# Patient Record
Sex: Female | Born: 1965 | Race: White | Hispanic: No | Smoking: Never smoker
Health system: Southern US, Community
[De-identification: ages and names within clinical notes are randomized; demographics above are authoritative.]

## PROBLEM LIST (undated history)

## (undated) DIAGNOSIS — J449 Chronic obstructive pulmonary disease, unspecified: Secondary | ICD-10-CM

## (undated) DIAGNOSIS — S161XXA Strain of muscle, fascia and tendon at neck level, initial encounter: Secondary | ICD-10-CM

## (undated) DIAGNOSIS — E079 Disorder of thyroid, unspecified: Secondary | ICD-10-CM

## (undated) DIAGNOSIS — F329 Major depressive disorder, single episode, unspecified: Secondary | ICD-10-CM

## (undated) DIAGNOSIS — J988 Other specified respiratory disorders: Secondary | ICD-10-CM

## (undated) DIAGNOSIS — G4730 Sleep apnea, unspecified: Secondary | ICD-10-CM

## (undated) DIAGNOSIS — R579 Shock, unspecified: Secondary | ICD-10-CM

## (undated) DIAGNOSIS — K219 Gastro-esophageal reflux disease without esophagitis: Secondary | ICD-10-CM

## (undated) DIAGNOSIS — C801 Malignant (primary) neoplasm, unspecified: Secondary | ICD-10-CM

## (undated) DIAGNOSIS — IMO0002 Reserved for concepts with insufficient information to code with codable children: Secondary | ICD-10-CM

## (undated) DIAGNOSIS — Q780 Osteogenesis imperfecta: Secondary | ICD-10-CM

## (undated) DIAGNOSIS — M5090 Cervical disc disorder, unspecified, unspecified cervical region: Secondary | ICD-10-CM

## (undated) DIAGNOSIS — G8918 Other acute postprocedural pain: Secondary | ICD-10-CM

## (undated) DIAGNOSIS — R55 Syncope and collapse: Secondary | ICD-10-CM

## (undated) DIAGNOSIS — I10 Essential (primary) hypertension: Secondary | ICD-10-CM

## (undated) DIAGNOSIS — G459 Transient cerebral ischemic attack, unspecified: Secondary | ICD-10-CM

## (undated) DIAGNOSIS — N189 Chronic kidney disease, unspecified: Secondary | ICD-10-CM

## (undated) DIAGNOSIS — R188 Other ascites: Secondary | ICD-10-CM

## (undated) DIAGNOSIS — F419 Anxiety disorder, unspecified: Secondary | ICD-10-CM

## (undated) DIAGNOSIS — E119 Type 2 diabetes mellitus without complications: Secondary | ICD-10-CM

## (undated) DIAGNOSIS — M549 Dorsalgia, unspecified: Secondary | ICD-10-CM

## (undated) DIAGNOSIS — G43909 Migraine, unspecified, not intractable, without status migrainosus: Secondary | ICD-10-CM

## (undated) DIAGNOSIS — M359 Systemic involvement of connective tissue, unspecified: Secondary | ICD-10-CM

## (undated) DIAGNOSIS — K7581 Nonalcoholic steatohepatitis (NASH): Secondary | ICD-10-CM

## (undated) DIAGNOSIS — K9189 Other postprocedural complications and disorders of digestive system: Secondary | ICD-10-CM

## (undated) DIAGNOSIS — E039 Hypothyroidism, unspecified: Secondary | ICD-10-CM

## (undated) DIAGNOSIS — R1012 Left upper quadrant pain: Secondary | ICD-10-CM

## (undated) DIAGNOSIS — K838 Other specified diseases of biliary tract: Secondary | ICD-10-CM

## (undated) HISTORY — DX: Left upper quadrant pain: R10.12

## (undated) HISTORY — DX: Reserved for concepts with insufficient information to code with codable children: IMO0002

## (undated) HISTORY — DX: Other acute postprocedural pain: G89.18

## (undated) HISTORY — DX: Other postprocedural complications and disorders of digestive system: K91.89

## (undated) HISTORY — PX: WISDOM TOOTH EXTRACTION: SHX21

## (undated) HISTORY — DX: Shock, unspecified: R57.9

## (undated) HISTORY — DX: Syncope and collapse: R55

## (undated) HISTORY — DX: Strain of muscle, fascia and tendon at neck level, initial encounter: S16.1XXA

## (undated) HISTORY — DX: Gastro-esophageal reflux disease without esophagitis: K21.9

## (undated) HISTORY — DX: Other specified respiratory disorders: J98.8

## (undated) HISTORY — DX: Migraine, unspecified, not intractable, without status migrainosus: G43.909

## (undated) HISTORY — DX: Major depressive disorder, single episode, unspecified: F32.9

## (undated) HISTORY — PX: TUBAL LIGATION: SHX77

## (undated) HISTORY — PX: ABDOMINAL HYSTERECTOMY: SHX81

## (undated) HISTORY — DX: Other specified diseases of biliary tract: K83.8

## (undated) SURGERY — Surgical Case
Anesthesia: *Unknown

---

## 2003-10-31 ENCOUNTER — Emergency Department: Payer: Self-pay | Admitting: Emergency Medicine

## 2003-10-31 ENCOUNTER — Other Ambulatory Visit: Payer: Self-pay

## 2006-11-08 ENCOUNTER — Other Ambulatory Visit: Payer: Self-pay

## 2006-11-08 ENCOUNTER — Emergency Department: Payer: Self-pay | Admitting: Emergency Medicine

## 2007-02-15 ENCOUNTER — Ambulatory Visit: Payer: Self-pay | Admitting: General Practice

## 2007-02-22 ENCOUNTER — Ambulatory Visit: Payer: Self-pay | Admitting: General Practice

## 2007-04-13 ENCOUNTER — Emergency Department: Payer: Self-pay | Admitting: Emergency Medicine

## 2007-05-04 ENCOUNTER — Emergency Department: Payer: Self-pay | Admitting: Emergency Medicine

## 2007-05-13 ENCOUNTER — Emergency Department: Payer: Self-pay | Admitting: Emergency Medicine

## 2007-07-12 ENCOUNTER — Ambulatory Visit: Payer: Self-pay | Admitting: Family Medicine

## 2007-08-05 ENCOUNTER — Emergency Department: Payer: Self-pay | Admitting: Emergency Medicine

## 2007-08-10 ENCOUNTER — Emergency Department: Payer: Self-pay | Admitting: Emergency Medicine

## 2007-10-12 ENCOUNTER — Ambulatory Visit: Payer: Self-pay
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
## 2008-05-09 ENCOUNTER — Emergency Department: Payer: Self-pay | Admitting: Emergency Medicine

## 2008-07-07 IMAGING — CR RIGHT ELBOW - COMPLETE 3+ VIEW
1 series · 5 of 5 positions shown · non-contrast
Comparison: none

REASON FOR EXAM: Right Elbow Pain-FAX RESULTS DR.JUMPER [phone]
COMMENTS:

PROCEDURE:     DXR - DXR ELBOW RT COMP W/OBLIQUES  - February 22, 2007 [DATE]
RESULT:     No fracture, dislocation or other acute bony abnormality is
identified.

[Series 1: view not recorded · 0.17mm/px · 5 of 5 slices shown]
[im 1/5]
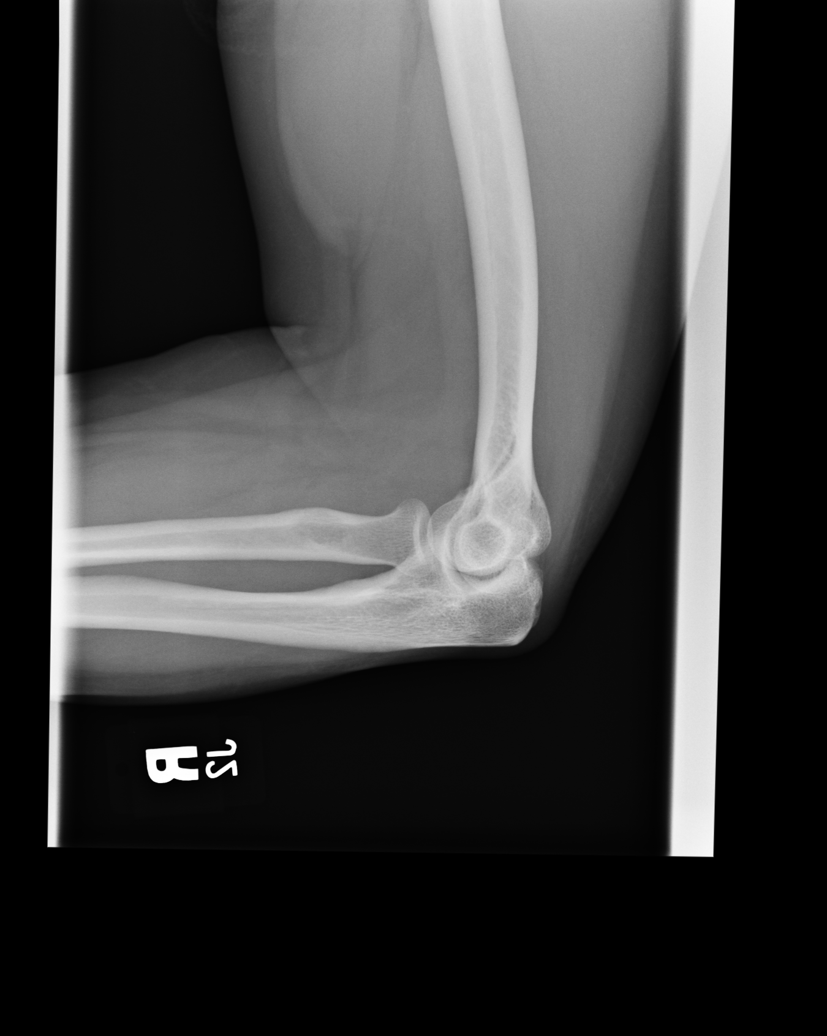
[im 2/5]
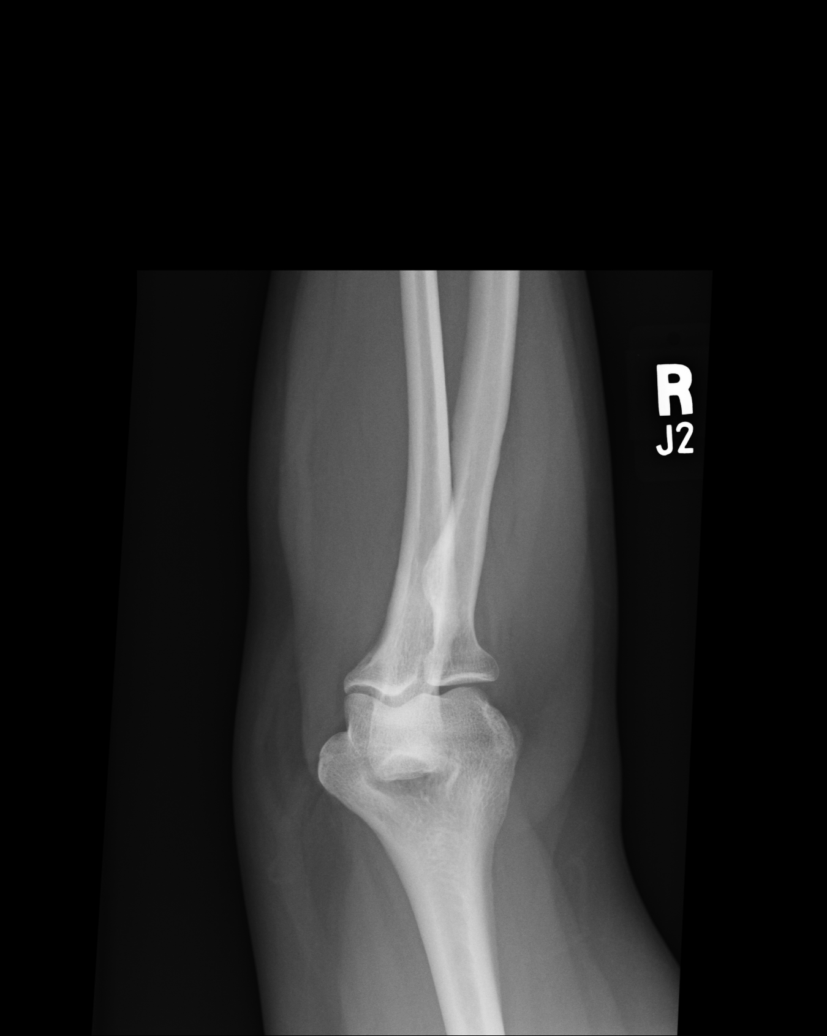
[im 3/5]
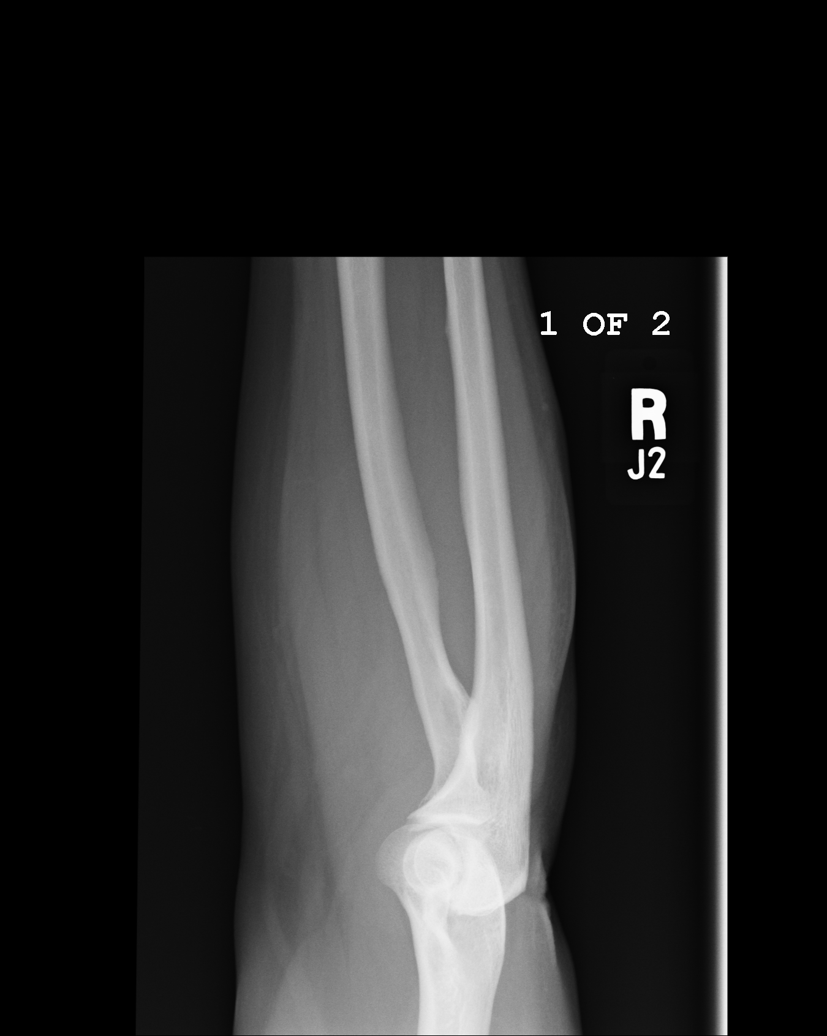
[im 4/5]
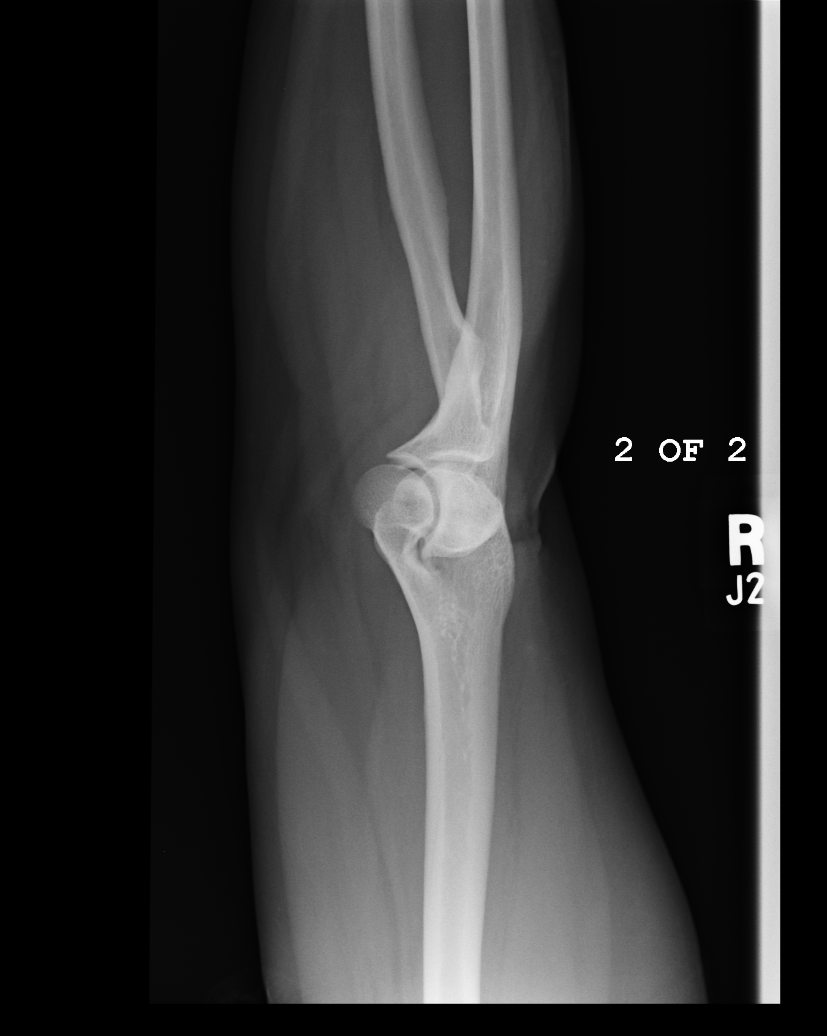
[im 5/5]
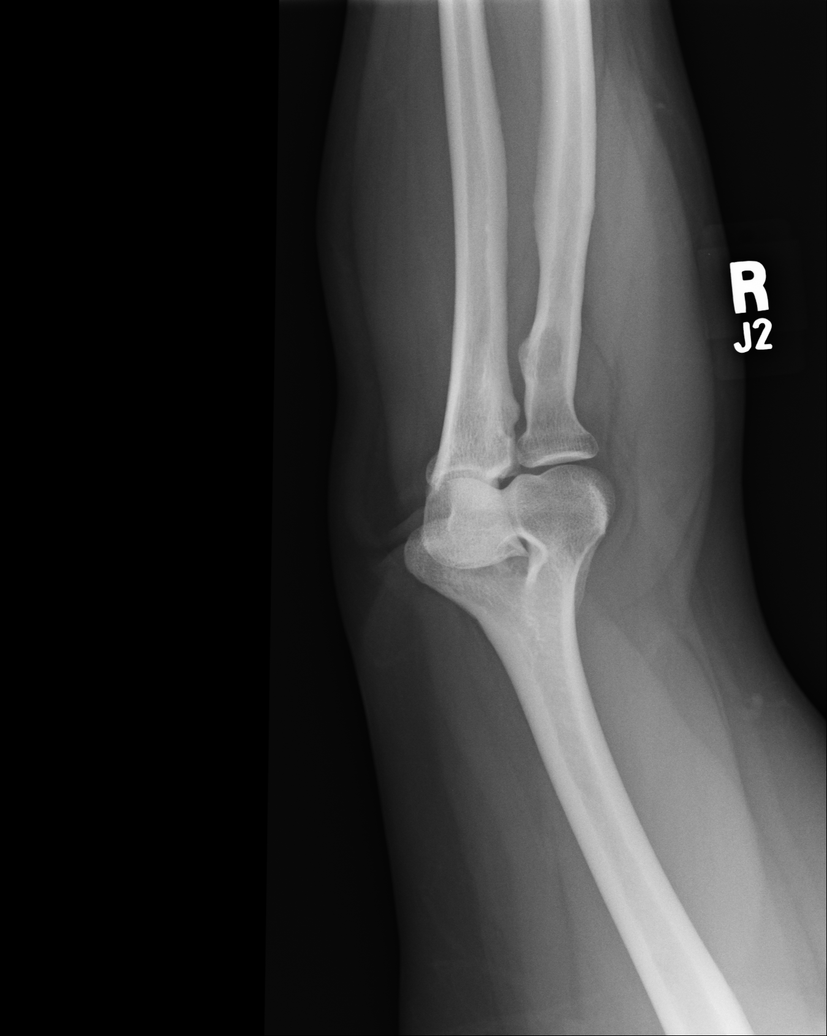

[5 of 5 positions shown; findings below may reference images not displayed]

IMPRESSION: 1. No significant osseous abnormality noted.

## 2008-11-24 IMAGING — CR DG LUMBAR SPINE 2-3V
1 series · 3 of 3 positions shown · non-contrast
Comparison: none

REASON FOR EXAM: Back Pain
COMMENTS:

[Series 1: view not recorded · 0.17mm/px · 3 of 3 slices shown]
[im 1/3]
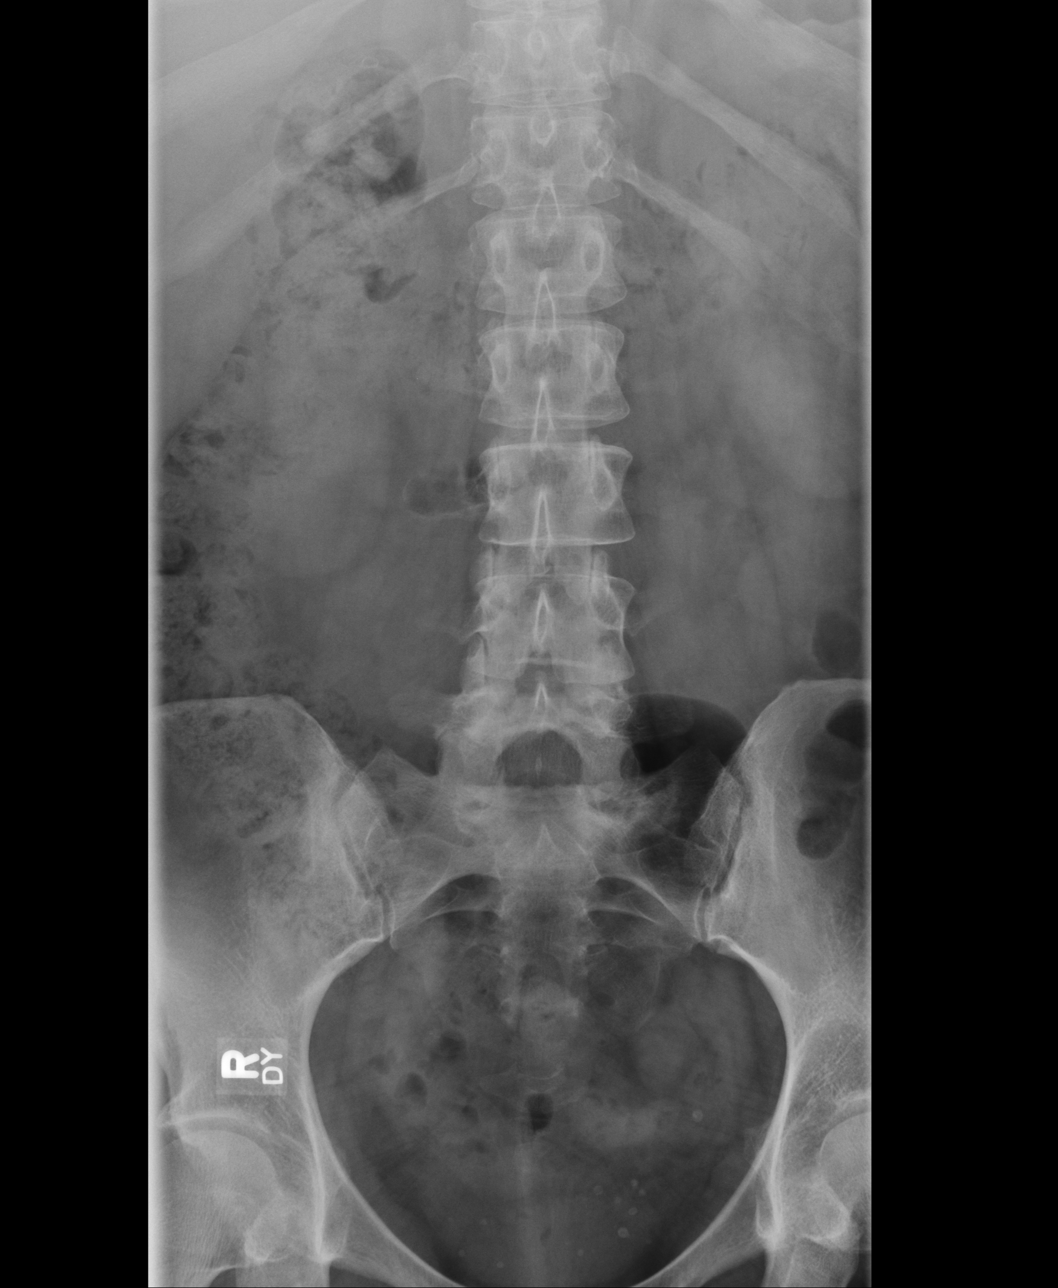
[im 2/3]
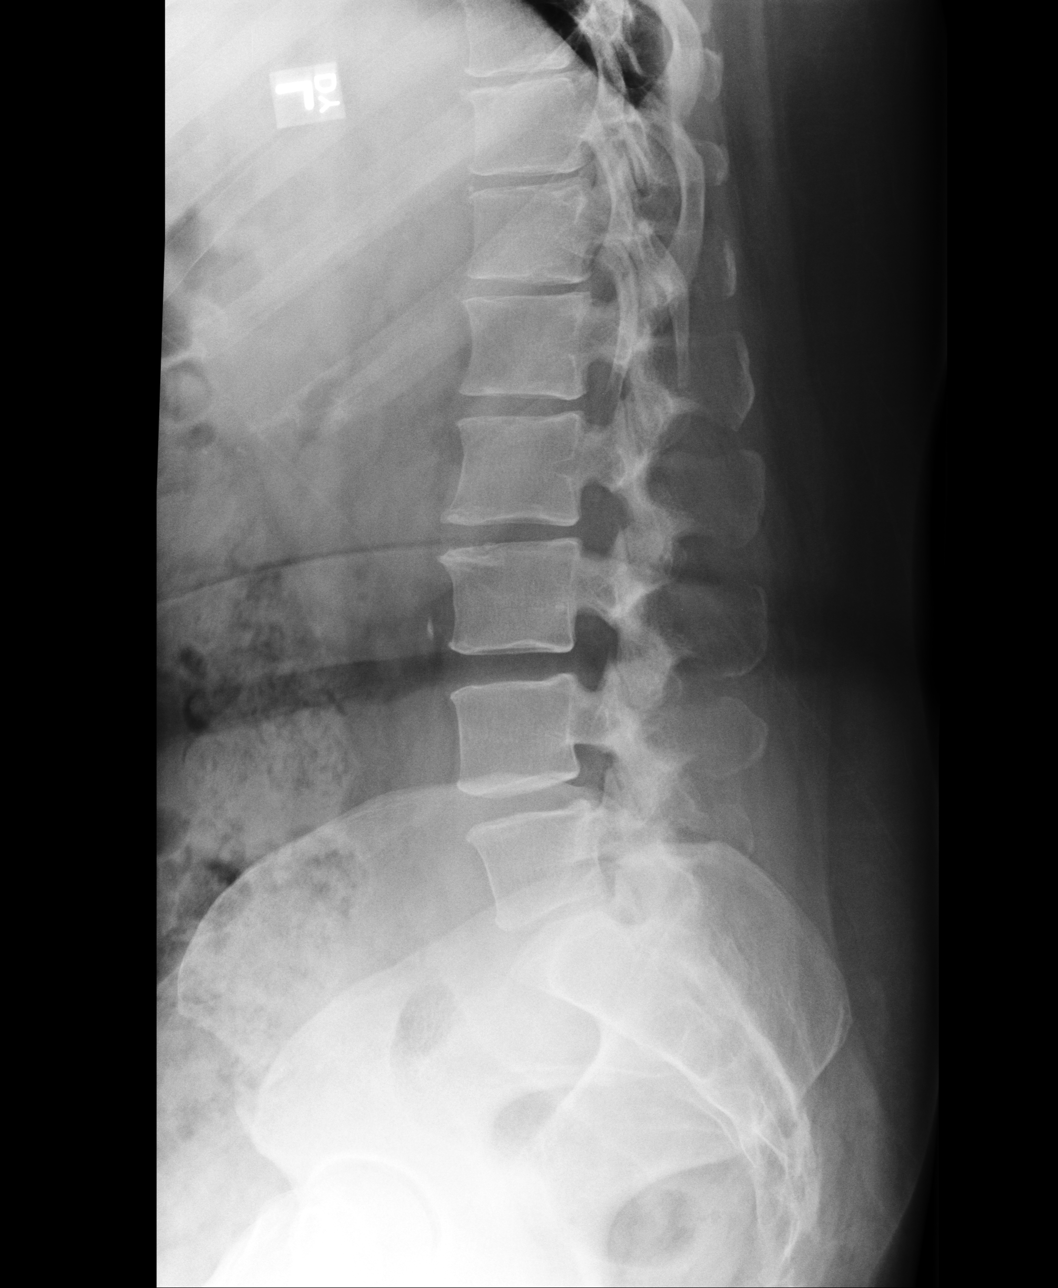
[im 3/3]
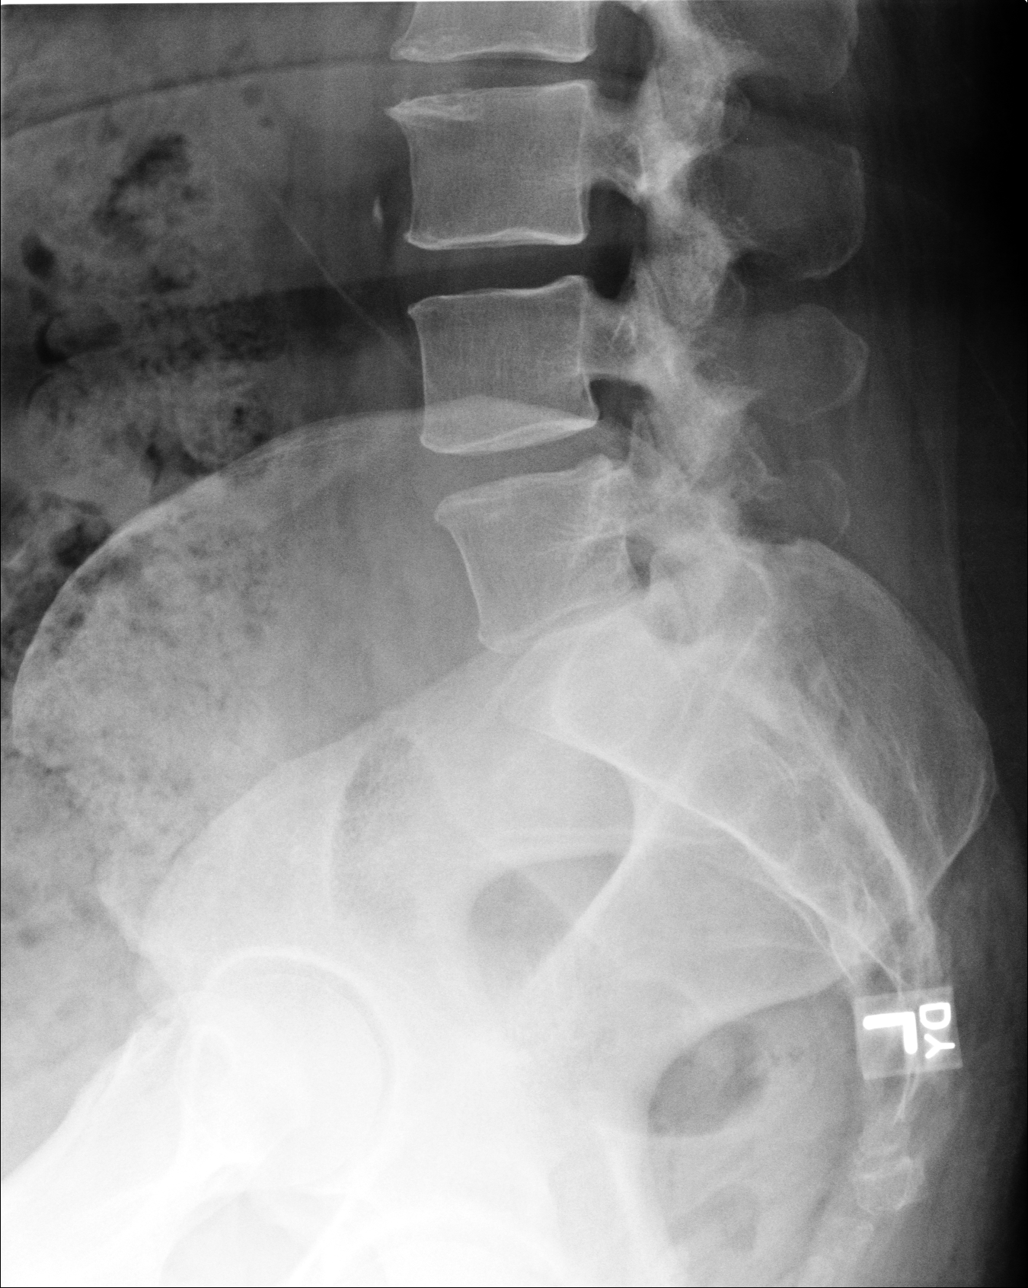

[3 of 3 positions shown; findings below may reference images not displayed]

PROCEDURE:     DXR - DXR LUMBAR SPINE AP AND LATERAL  - July 12, 2007  [DATE]

RESULT:     The vertebral body heights are well maintained. No fracture is
seen. No lytic or blastic lesions are noted. The pedicles are bilaterally
intact. There is a very slightly narrowed appearance to the intervertebral
disc space at L4-L5. The finding is quite minimal but could represent early
manifestation of disc disease. This could be further evaluated by MR if such
is clinically indicated. There is also observed narrowing of the L5-S1
intervertebral disc space which is likely developmental although disc
disease cannot be entirely excluded on plain film examination. This, too,
could be further evaluated at MR, if clinically indicated. There is slight
anterior hypertrophic spurring at L2 and L3.
IMPRESSION: 1. No fracture or other acute bony abnormality is seen.
2. Possible narrowing of the disc spaces at L4-L5 and L5-S1. This could be
further evaluated by MR if clinically indicated.

Thank you for the opportunity to contribute to the care of your patient.

ORDERING AND ATTENDING PHYSICIAN SHOULDER READ:
Klever Jumper, M.D.
DDS Medical Consultant

## 2008-12-18 IMAGING — US US EXTREM LOW VENOUS BILAT
1 series · 17 of 24 positions shown · non-contrast
Comparison: none

REASON FOR EXAM: leg swelling with Tabak factors for DVT
COMMENTS:   LMP: Post Hysterectomy

PROCEDURE:     US  - US DOPPLER LOW EXTR BILATERAL  - August 05, 2007  [DATE]
RESULT:     Comparison: None
INDICATION: Swelling

[Series 1: us extrem low venous bilat · 17 of 38 slices shown]
[im 1/38]
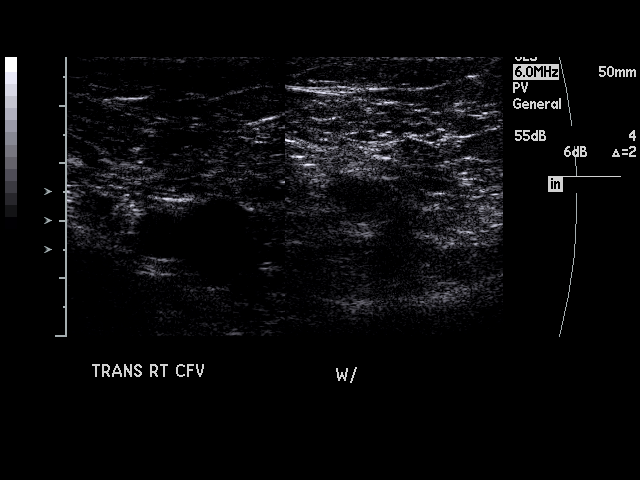
[im 4/38]
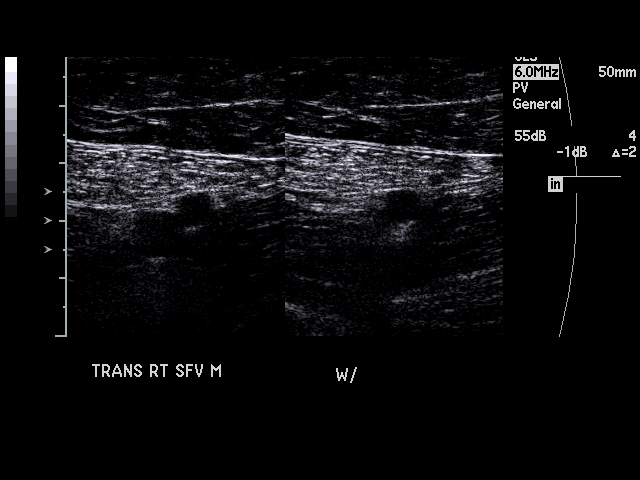
[im 5/38]
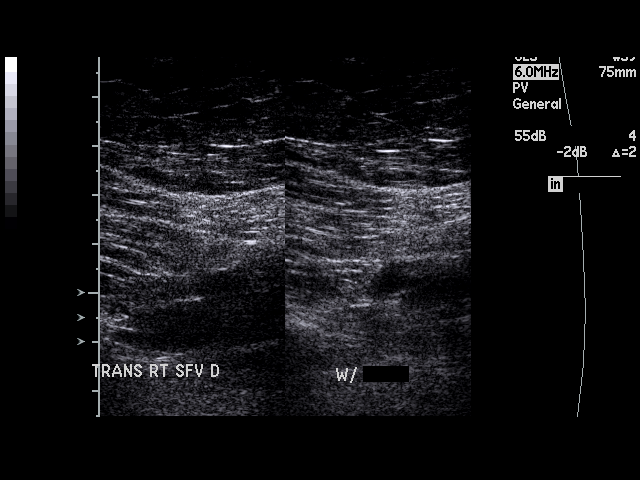
[im 7/38]
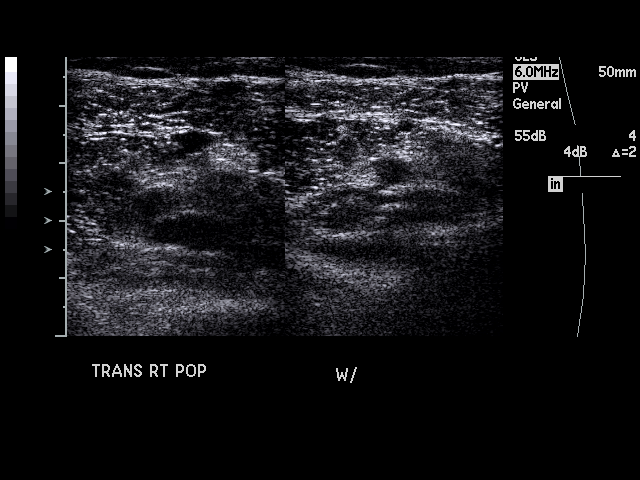
[im 10/38]
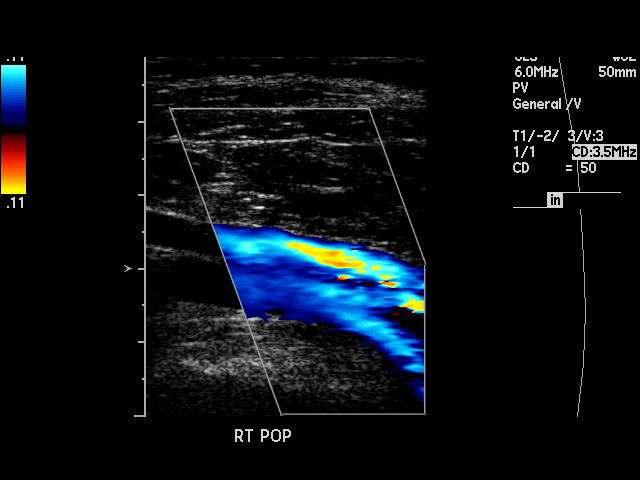
[im 12/38]
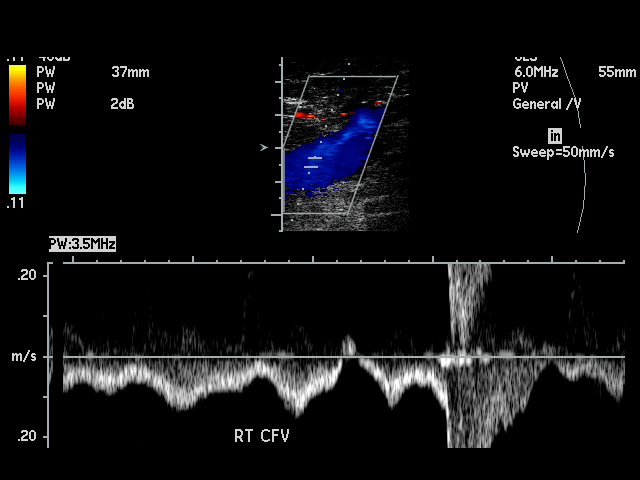
[im 15/38]
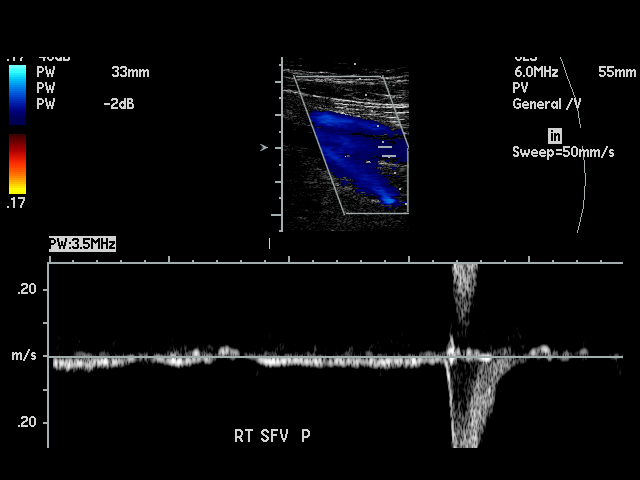
[im 17/38]
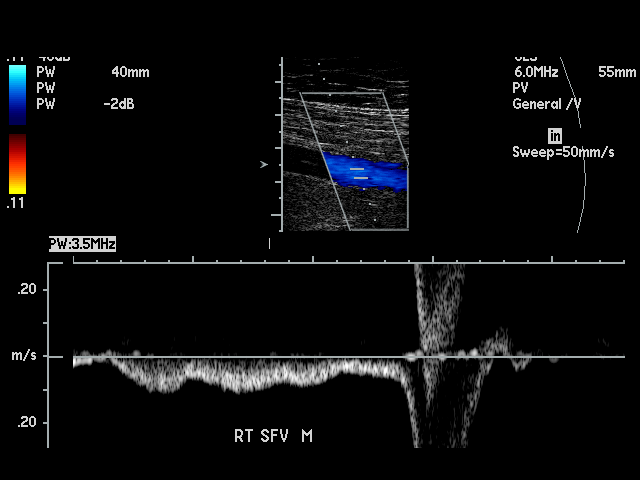
[im 20/38]
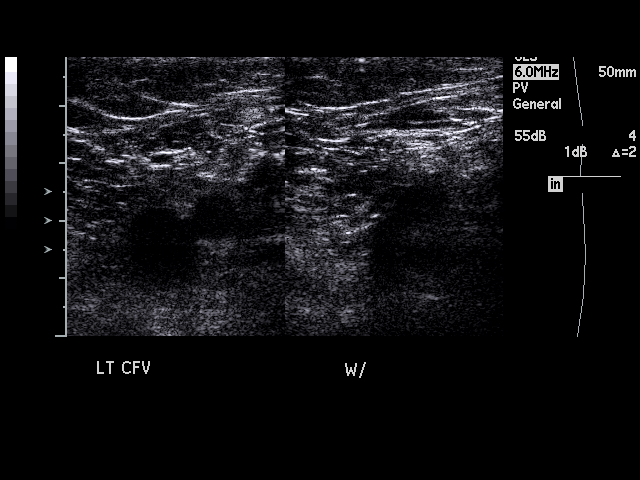
[im 21/38]
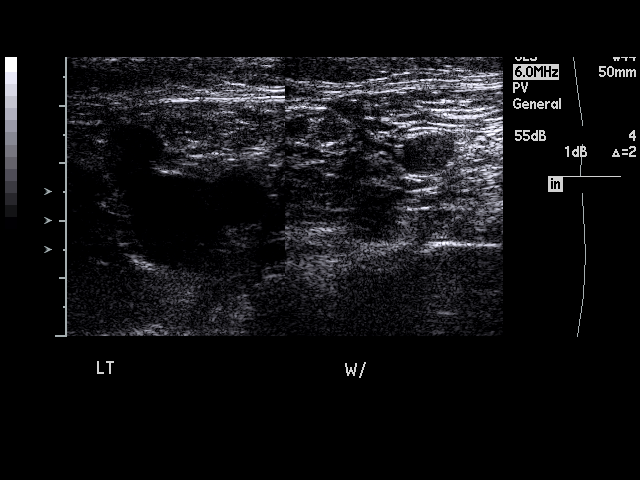
[im 23/38]
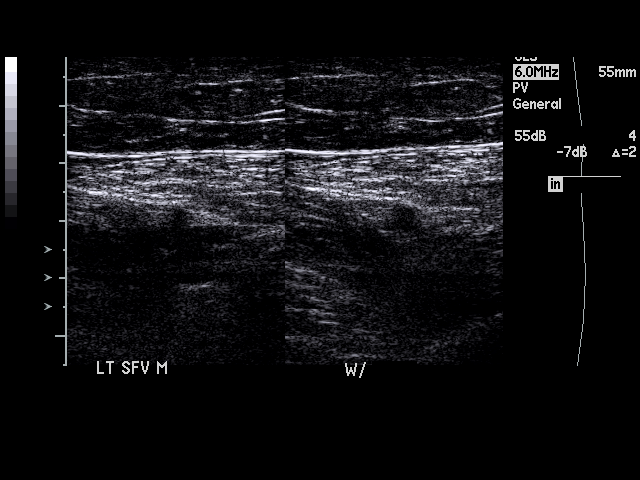
[im 26/38]
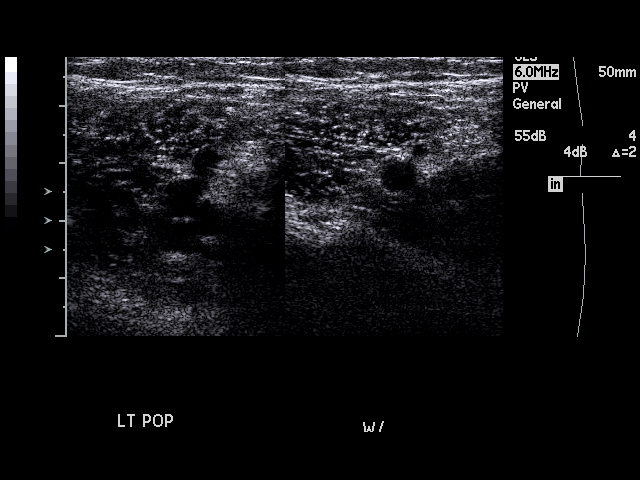
[im 28/38]
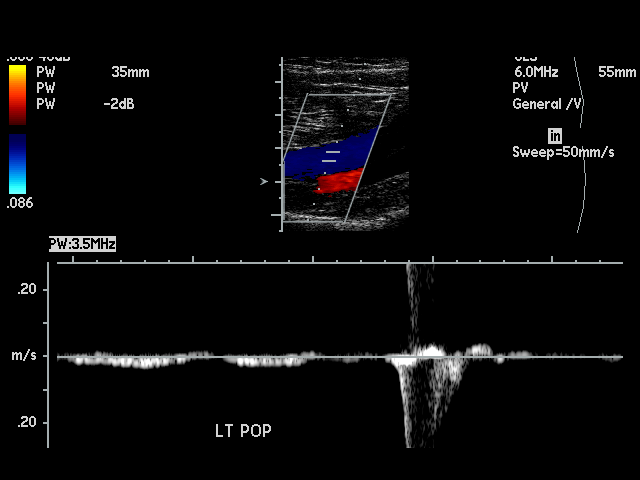
[im 31/38]
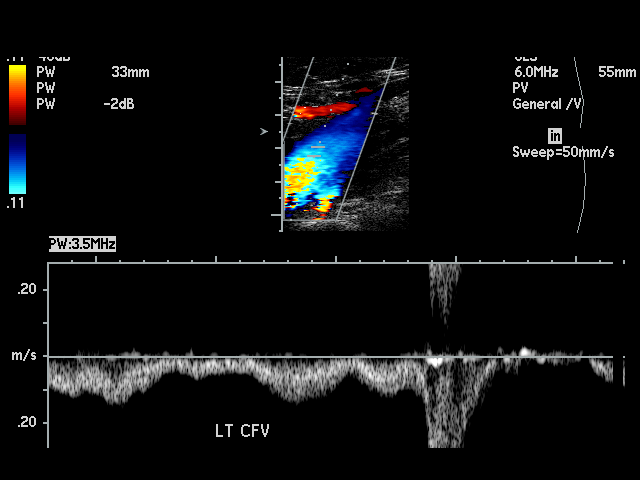
[im 33/38]
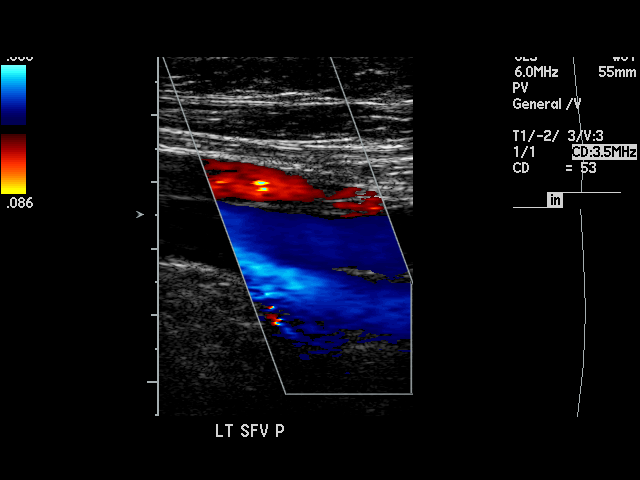
[im 34/38]
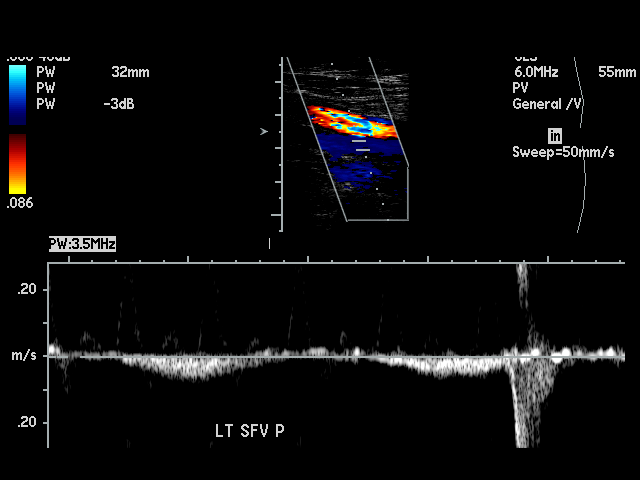
[im 38/38]
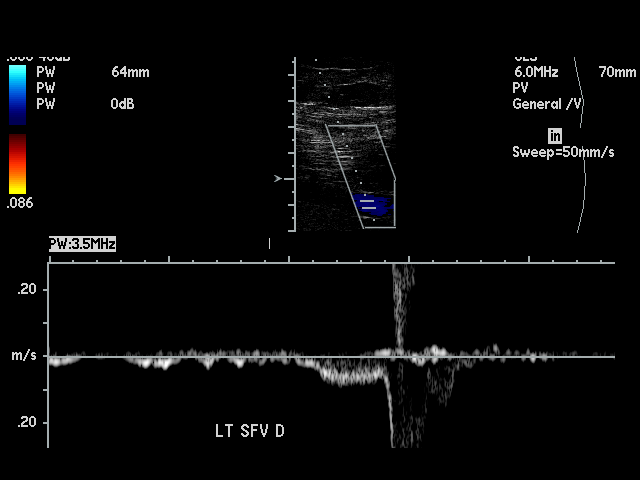

[17 of 24 positions shown; findings below may reference images not displayed]

FINDINGS: Multiple longitudinal and transverse gray-scale as well as color
and spectral Doppler images of the bilateral lower extremity veins were
obtained from the common femoral veins through the popliteal veins.

The right common femoral, greater saphenous, femoral, popliteal veins, and
venous trifurcation are patent, demonstrating normal color-flow and
compressibility. No intraluminal thrombus is identified.There is normal
respiratory variation and augmentation demonstrated at the popliteal vein
levels.

The left common femoral, greater saphenous, femoral, popliteal veins, and
venous trifurcation are patent, demonstrating normal color-flow and
compressibility. No intraluminal thrombus is identified.There is normal
respiratory variation and augmentation demonstrated at the popliteal vein
levels.
IMPRESSION: No evidence of DVT in the bilateral lower extremity.

## 2009-08-24 ENCOUNTER — Inpatient Hospital Stay: Payer: Self-pay | Admitting: Specialist

## 2009-08-27 ENCOUNTER — Emergency Department: Payer: Self-pay | Admitting: Emergency Medicine

## 2009-09-04 ENCOUNTER — Emergency Department: Payer: Self-pay | Admitting: Emergency Medicine

## 2009-09-23 IMAGING — CT CT ABD-PELV W/O CM
1 of 2 series · 15 of 32 positions shown, 19 images · non-contrast
Comparison: none

REASON FOR EXAM: (1) R flank and RLQ pain and hematuria; (2) R flank and
RLQ pain, Stone protocol
COMMENTS:

PROCEDURE:     CT  - CT ABDOMEN AND PELVIS W[DATE]  [DATE]
RESULT:     Emergent noncontrast CT of the abdomen and pelvis is performed
with reconstructions created at 3 mm slice thickness utilizing a renal stone
protocol.

[Series 2: stone · axial · 0.92mm/px · z∈[-700,-268]mm · 15 of 156 slices shown, 19 images]
[im 6/156  soft-tissue]
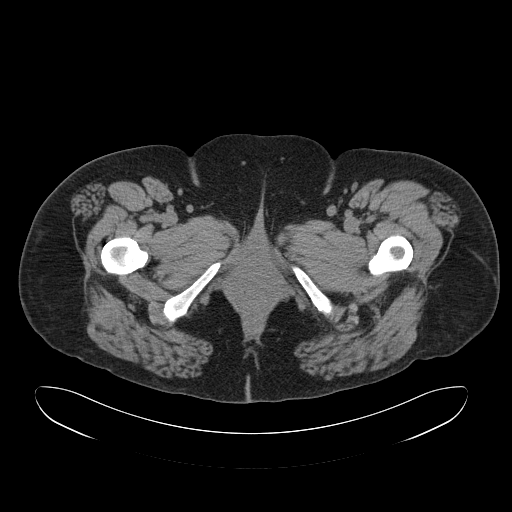
[im 6/156  bone]
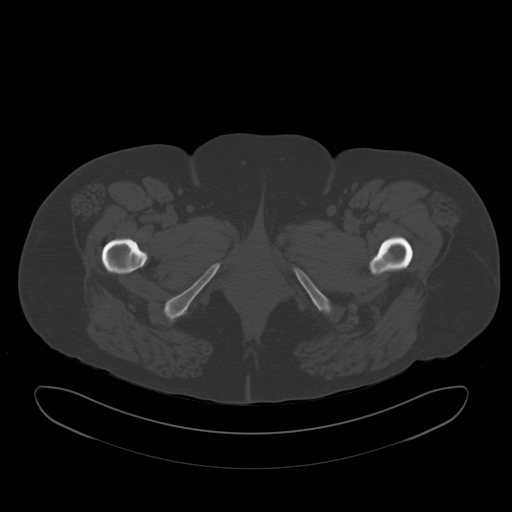
[im 18/156  soft-tissue]
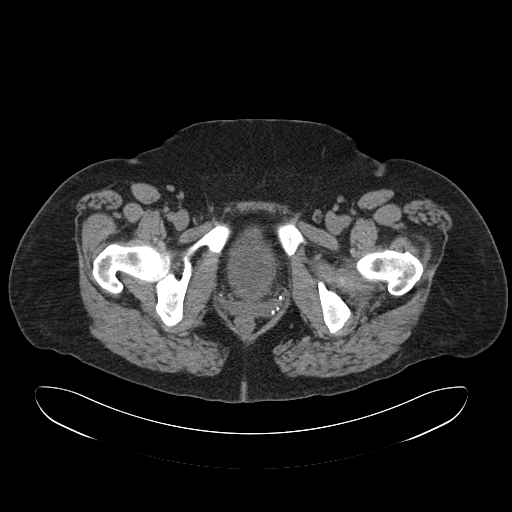
[im 30/156  soft-tissue]
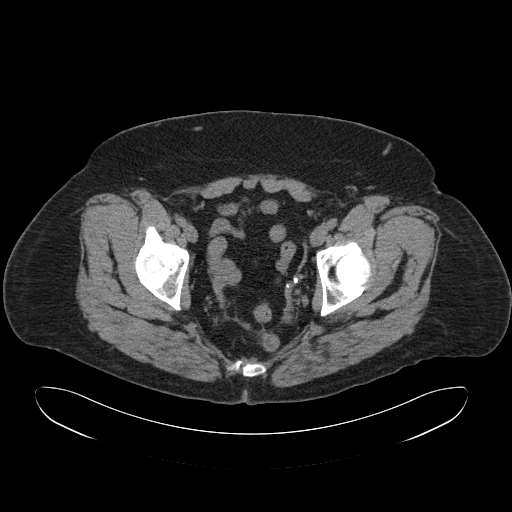
[im 42/156  soft-tissue]
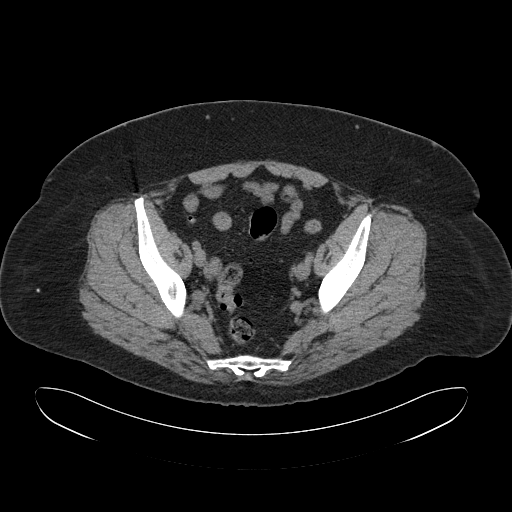
[im 54/156  soft-tissue]
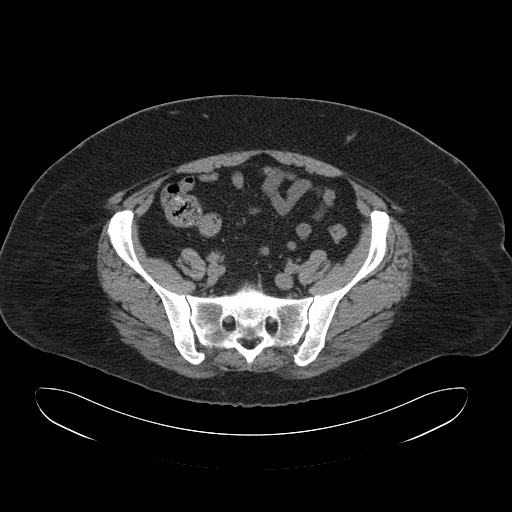
[im 66/156  soft-tissue]
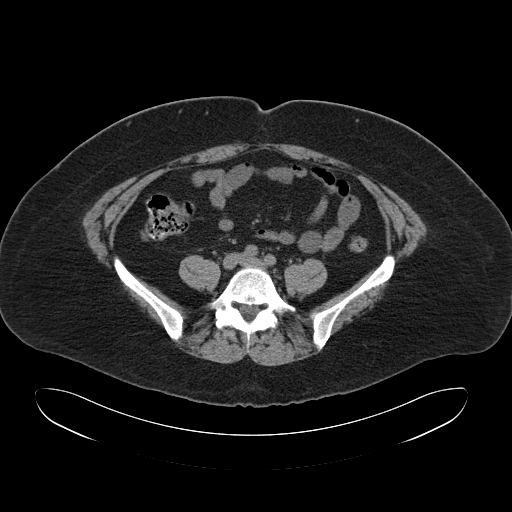
[im 78/156  soft-tissue]
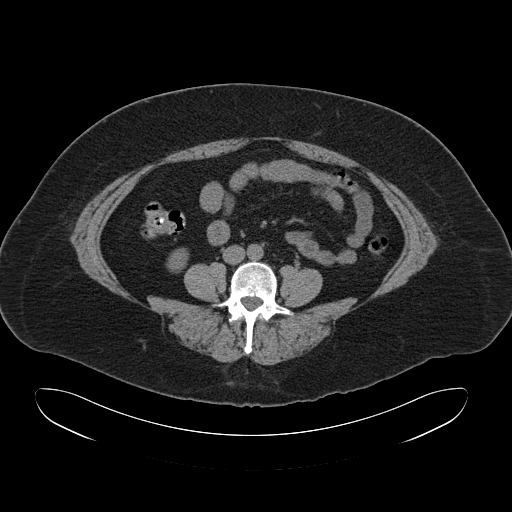
[im 90/156  soft-tissue]
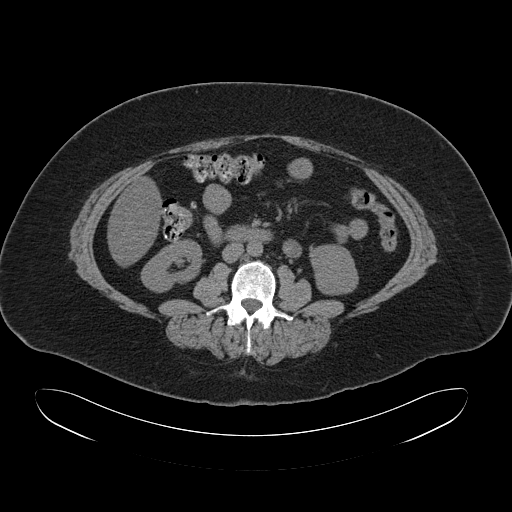
[im 102/156  soft-tissue]
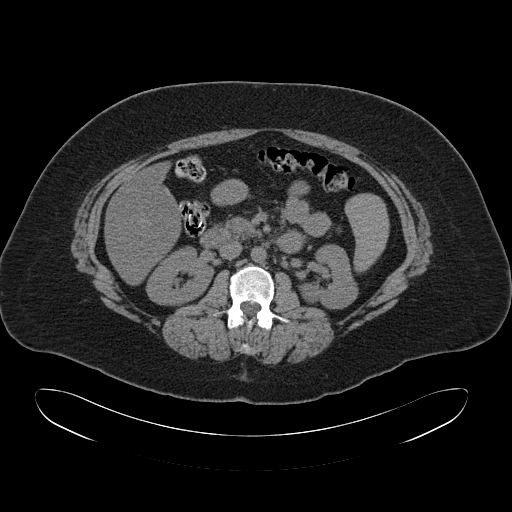
[im 102/156  bone]
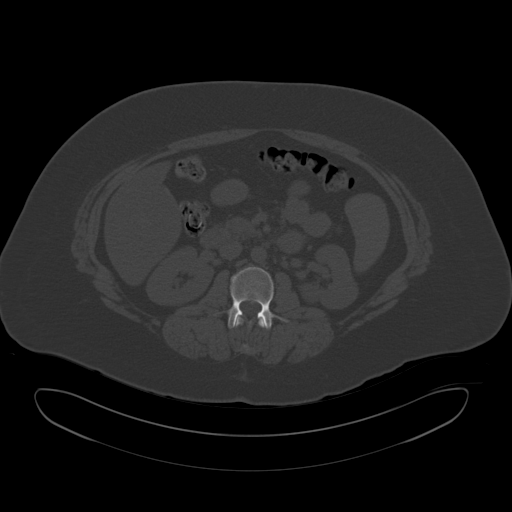
[im 114/156  soft-tissue]
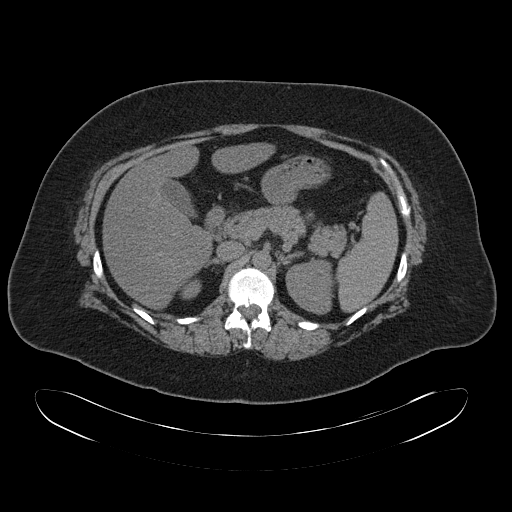
[im 126/156  soft-tissue]
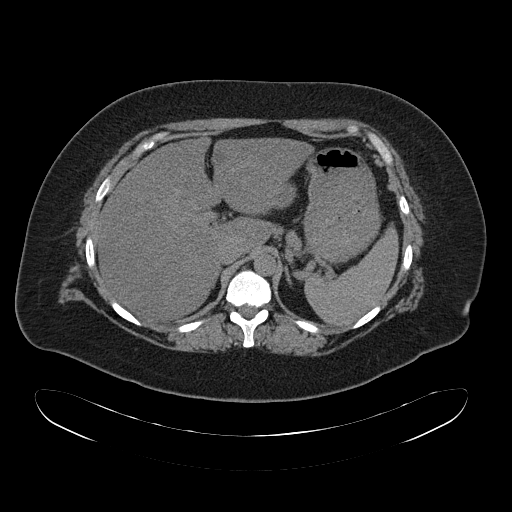
[im 132/156  lung]
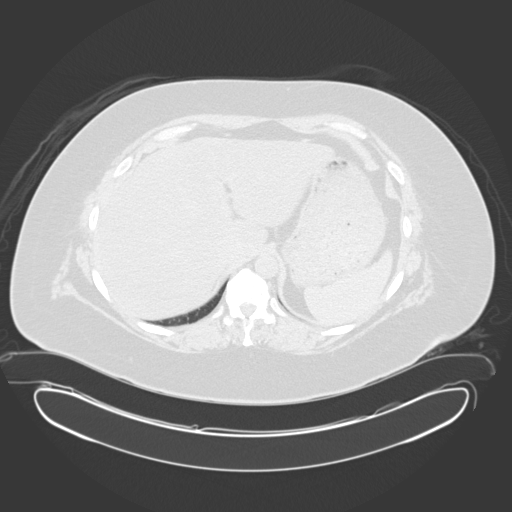
[im 138/156  soft-tissue]
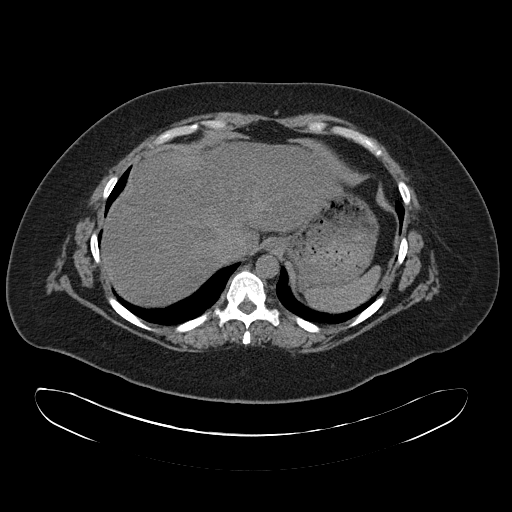
[im 138/156  lung]
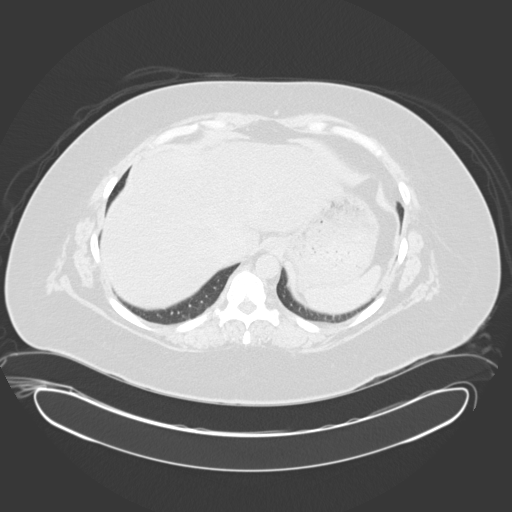
[im 144/156  lung]
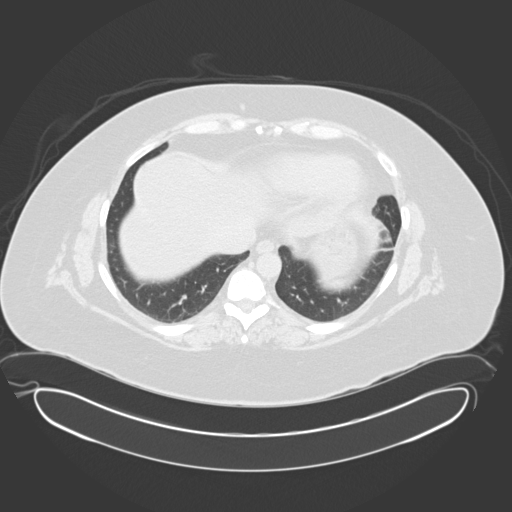
[im 150/156  soft-tissue]
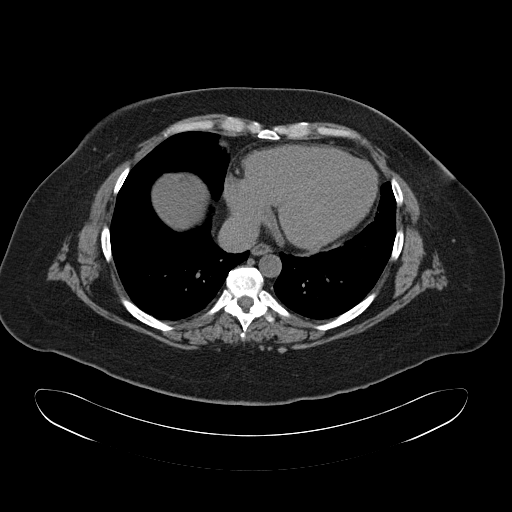
[im 150/156  lung]
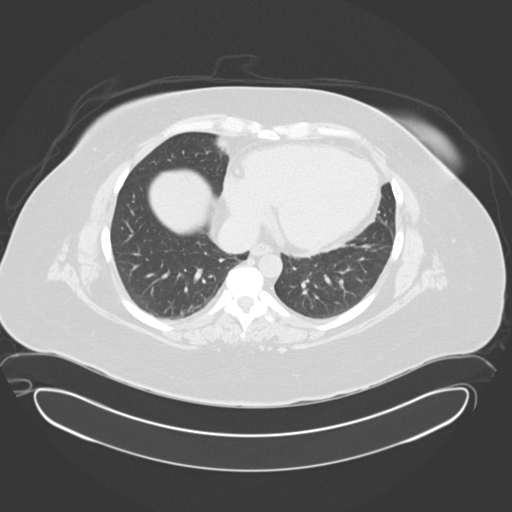

[15 of 32 positions shown; findings below may reference images not displayed]

FINDINGS: There are no urinary tract calculi or obstructive changes present.
The appendix appears normal. The urinary bladder is decompressed. There is
no ascites or abnormal bowel distention. There is no free air. The upper
abdominal viscera appear to be grossly normal for a noncontrast exam. No
radiopaque gallstones are evident. The fatty tissues around the bladder
suggest possibly some minimal inflammatory changes. Correlate for cystitis
or acute urinary tract infection.
IMPRESSION: 1.     No evidence of appendicitis.
2.     No urinary tract calculi or obstructive changes. The bladder is
nondistended and difficult to evaluate. There may be some minimal
surrounding inflammation. Correlate for cystitis.

## 2010-02-28 ENCOUNTER — Ambulatory Visit: Payer: Self-pay | Admitting: Anesthesiology

## 2010-08-10 DIAGNOSIS — K219 Gastro-esophageal reflux disease without esophagitis: Secondary | ICD-10-CM

## 2010-08-10 HISTORY — DX: Gastro-esophageal reflux disease without esophagitis: K21.9

## 2010-08-30 ENCOUNTER — Emergency Department: Payer: Self-pay | Admitting: Emergency Medicine

## 2011-01-07 ENCOUNTER — Encounter: Payer: Self-pay | Admitting: Emergency Medicine

## 2011-01-07 ENCOUNTER — Emergency Department (HOSPITAL_COMMUNITY)
Admission: EM | Admit: 2011-01-07 | Discharge: 2011-01-08 | Disposition: A | Discharge status: Home / Self Care | Payer: Self-pay | Attending: Emergency Medicine | Admitting: Emergency Medicine

## 2011-01-07 DIAGNOSIS — R339 Retention of urine, unspecified: Secondary | ICD-10-CM | POA: Insufficient documentation

## 2011-01-07 DIAGNOSIS — I10 Essential (primary) hypertension: Secondary | ICD-10-CM | POA: Insufficient documentation

## 2011-01-07 DIAGNOSIS — R109 Unspecified abdominal pain: Secondary | ICD-10-CM | POA: Insufficient documentation

## 2011-01-07 DIAGNOSIS — Q780 Osteogenesis imperfecta: Secondary | ICD-10-CM | POA: Insufficient documentation

## 2011-01-07 DIAGNOSIS — R10819 Abdominal tenderness, unspecified site: Secondary | ICD-10-CM | POA: Insufficient documentation

## 2011-01-07 DIAGNOSIS — R1013 Epigastric pain: Secondary | ICD-10-CM | POA: Insufficient documentation

## 2011-01-07 DIAGNOSIS — D3500 Benign neoplasm of unspecified adrenal gland: Secondary | ICD-10-CM | POA: Insufficient documentation

## 2011-01-07 DIAGNOSIS — J45909 Unspecified asthma, uncomplicated: Secondary | ICD-10-CM | POA: Insufficient documentation

## 2011-01-07 DIAGNOSIS — R1032 Left lower quadrant pain: Secondary | ICD-10-CM | POA: Insufficient documentation

## 2011-01-07 DIAGNOSIS — R3915 Urgency of urination: Secondary | ICD-10-CM | POA: Insufficient documentation

## 2011-01-07 HISTORY — DX: Disorder of thyroid, unspecified: E07.9

## 2011-01-07 HISTORY — DX: Osteogenesis imperfecta: Q78.0

## 2011-01-07 HISTORY — DX: Essential (primary) hypertension: I10

## 2011-01-07 IMAGING — CR DG CHEST 1V PORT
1 series · 1 of 1 positions shown · non-contrast
Comparison: none

REASON FOR EXAM: overdose,hypoxia
COMMENTS:

[view not recorded]
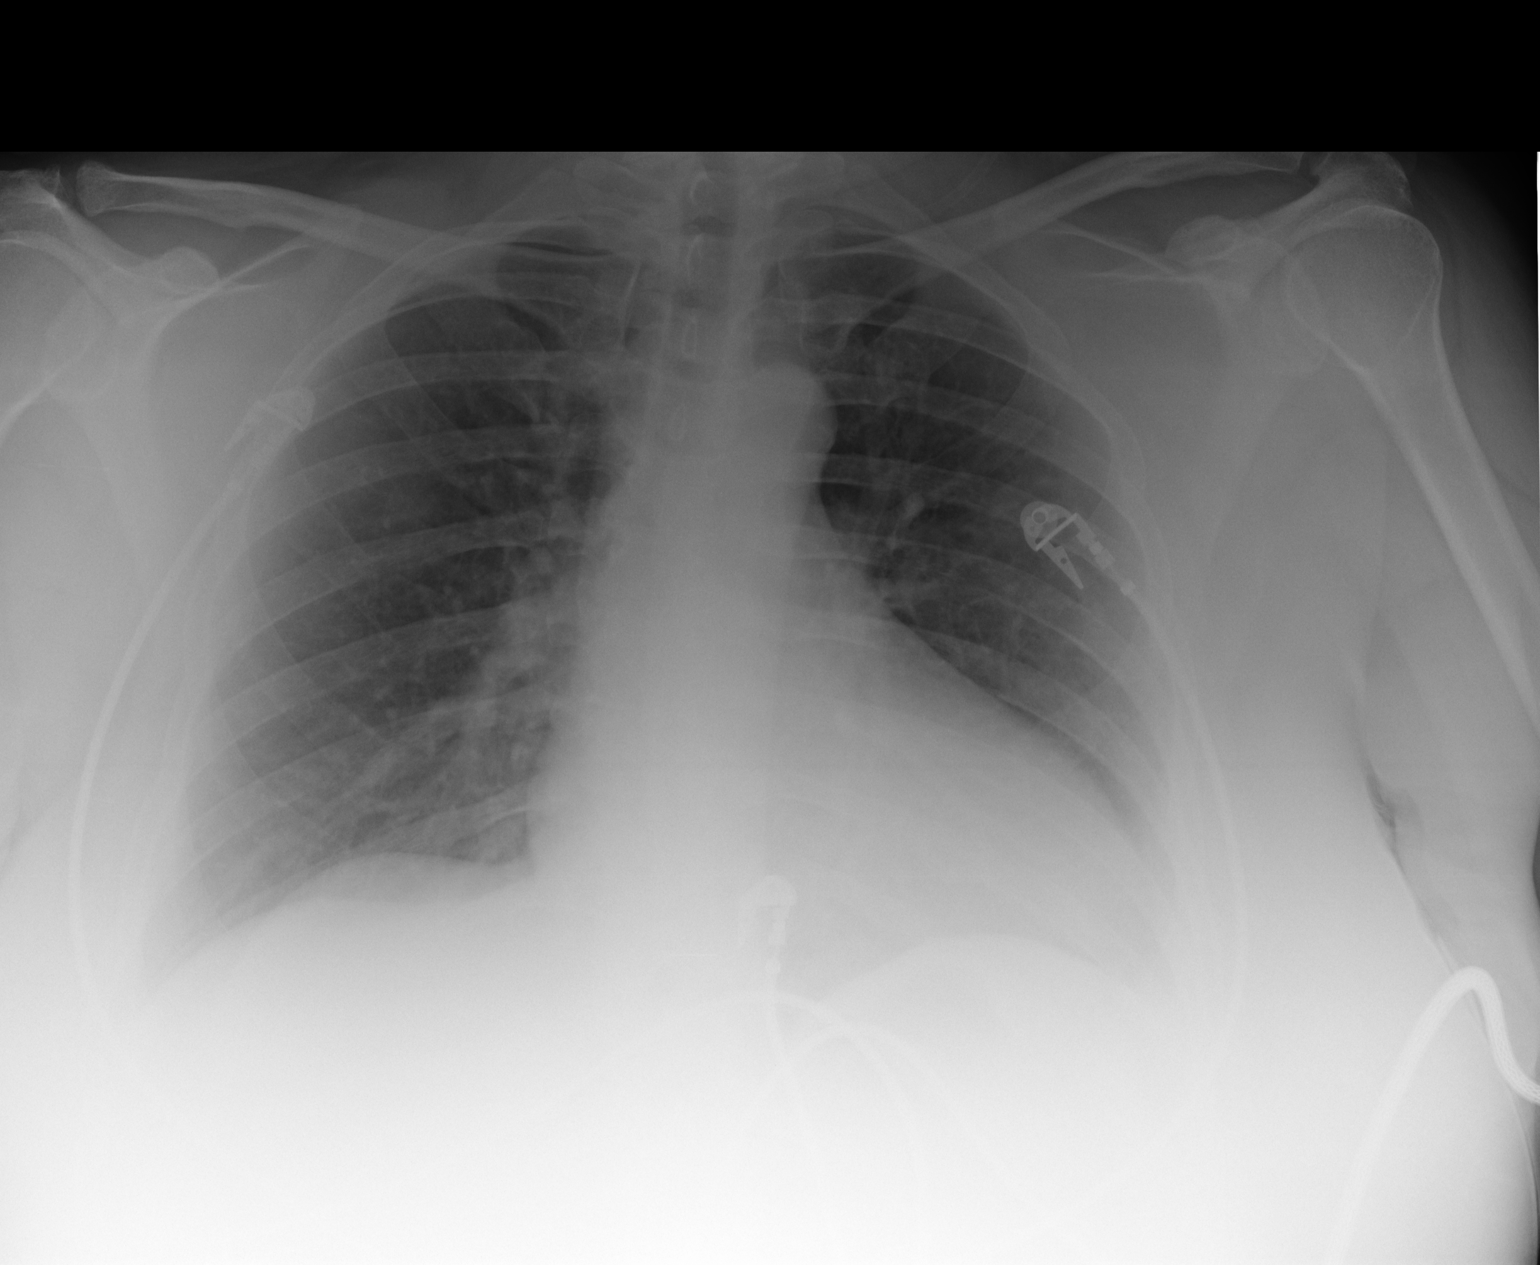

[1 of 1 positions shown; findings below may reference images not displayed]

PROCEDURE:     DXR - DXR PORTABLE CHEST SINGLE VIEW  - August 24, 2009  [DATE]

RESULT:     Frontal view of the chest is obtained. Comparison is made to a
prior study dated 11/08/2006.

The patient has taken a shallow inspiration. There is no evidence of focal
infiltrates, effusions or edema. The cardiac silhouette is moderately
enlarged. The visualized bony skeleton is unremarkable.
IMPRESSION: Shallow inspiration without evidence of acute
cardiopulmonary disease.

## 2011-01-07 NOTE — ED Provider Notes (Signed)
History     CSN: [id number]  Arrival date & time 01/07/11  2338   First MD Initiated Contact with Patient 01/07/11 2353      Chief Complaint  Patient presents with  . Urinary Retention  . Abdominal Pain    (Consider location/radiation/quality/duration/timing/severity/associated sxs/prior treatment) Patient is a 46 y.o. female presenting with abdominal pain. The history is provided by the patient.  Abdominal Pain The primary symptoms of the illness include abdominal pain.   patient presents with urinary urgency x24 hours. She denies any dysuria. Does note some flank pain. No fever or vomiting noted. States she's only urinating small amounts. No history of same. No medications taken prior to arrival. She describes suprapubic sharp pain which radiates to her left flank  Past Medical History  Diagnosis Date  . Brittle bone disease   . Asthma   . Thyroid disease   . Hypertension     Past Surgical History  Procedure Date  . Abdominal hysterectomy     No family history on file.  History  Substance Use Topics  . Smoking status: Never Smoker   . Smokeless tobacco: Not on file  . Alcohol Use: Yes     occ    OB History    Grav Para Term Preterm Abortions TAB SAB Ect Mult Living                  Review of Systems  Gastrointestinal: Positive for abdominal pain.  All other systems reviewed and are negative.    Allergies  Vicodin  Home Medications   Current Outpatient Rx  Name Route Sig Dispense Refill  . ALPRAZOLAM 2 MG PO TABS Oral Take 2 mg by mouth 3 (three) times daily as needed.      . OXYCODONE HCL 30 MG PO TABS Oral Take 30 mg by mouth 5 (five) times daily.      . SUMATRIPTAN SUCCINATE 50 MG PO TABS Oral Take 50 mg by mouth every 2 (two) hours as needed.        BP 105/79  Pulse 84  Temp(Src) 98.6 F (37 C) (Oral)  Resp 20  Ht 5' 2"  (1.575 m)  Wt 243 lb (110.224 kg)  BMI 44.45 kg/m2  SpO2 99%  Physical Exam  Nursing note and vitals  reviewed. Constitutional: She is oriented to person, place, and time. Vital signs are normal. She appears well-developed and well-nourished.  Non-toxic appearance. No distress.  HENT:  Head: Normocephalic and atraumatic.  Eyes: Conjunctivae, EOM and lids are normal. Pupils are equal, round, and reactive to light.  Neck: Normal range of motion. Neck supple. No tracheal deviation present. No mass present.  Cardiovascular: Normal rate, regular rhythm and normal heart sounds.  Exam reveals no gallop.   No murmur heard. Pulmonary/Chest: Effort normal and breath sounds normal. No stridor. No respiratory distress. She has no decreased breath sounds. She has no wheezes. She has no rhonchi. She has no rales.  Abdominal: Soft. Normal appearance and bowel sounds are normal. She exhibits no distension. There is tenderness in the suprapubic area. There is no rigidity, no rebound, no guarding and no CVA tenderness.  Musculoskeletal: Normal range of motion. She exhibits no edema and no tenderness.  Neurological: She is alert and oriented to person, place, and time. She has normal strength. No cranial nerve deficit or sensory deficit. GCS eye subscore is 4. GCS verbal subscore is 5. GCS motor subscore is 6.  Skin: Skin is warm and dry. No abrasion  and no rash noted.  Psychiatric: She has a normal mood and affect. Her speech is normal and behavior is normal.    ED Course  Procedures (including critical care time)  Labs Reviewed - No data to display No results found.   No diagnosis found.    MDM  Patient reexamined and now notes left upper quadrant pain. Will order labs an abdominal CT      3:09 AM Patient's abdominal CT results reviewed. No signs of acute abdominal process noted. She was instructed to followup with her Dr. for her adrenal adenoma. Repeat abdominal exam is benign  Leota Jacobsen, MD 01/08/11 [phone]

## 2011-01-07 NOTE — ED Notes (Signed)
Patient c/o urinary retention and abdominal pain today.  States she can only dribble.

## 2011-01-08 ENCOUNTER — Emergency Department (HOSPITAL_COMMUNITY): Payer: Self-pay

## 2011-01-08 LAB — HEPATIC FUNCTION PANEL
ALT: 49 U/L — ABNORMAL HIGH (ref 0–35)
AST: 33 U/L (ref 0–37)
Albumin: 3.4 g/dL — ABNORMAL LOW (ref 3.5–5.2)
Alkaline Phosphatase: 65 U/L (ref 39–117)
Bilirubin, Direct: 0.1 mg/dL (ref 0.0–0.3)
Indirect Bilirubin: 0.4 mg/dL (ref 0.3–0.9)
Total Bilirubin: 0.5 mg/dL (ref 0.3–1.2)
Total Protein: 6.9 g/dL (ref 6.0–8.3)

## 2011-01-08 LAB — DIFFERENTIAL
Basophils Absolute: 0 10*3/uL (ref 0.0–0.1)
Basophils Relative: 0 % (ref 0–1)
Eosinophils Absolute: 0.1 10*3/uL (ref 0.0–0.7)
Eosinophils Relative: 1 % (ref 0–5)
Lymphocytes Relative: 27 % (ref 12–46)
Lymphs Abs: 2.7 10*3/uL (ref 0.7–4.0)
Monocytes Absolute: 0.5 10*3/uL (ref 0.1–1.0)
Monocytes Relative: 5 % (ref 3–12)
Neutro Abs: 6.9 10*3/uL (ref 1.7–7.7)
Neutrophils Relative %: 68 % (ref 43–77)

## 2011-01-08 LAB — URINALYSIS, ROUTINE W REFLEX MICROSCOPIC
Bilirubin Urine: NEGATIVE
Glucose, UA: NEGATIVE mg/dL
Hgb urine dipstick: NEGATIVE
Ketones, ur: NEGATIVE mg/dL
Leukocytes, UA: NEGATIVE
Nitrite: NEGATIVE
Protein, ur: NEGATIVE mg/dL
Specific Gravity, Urine: 1.025 (ref 1.005–1.030)
Urobilinogen, UA: 0.2 mg/dL (ref 0.0–1.0)
pH: 5.5 (ref 5.0–8.0)

## 2011-01-08 LAB — BASIC METABOLIC PANEL
BUN: 10 mg/dL (ref 6–23)
CO2: 31 mEq/L (ref 19–32)
Calcium: 10 mg/dL (ref 8.4–10.5)
Chloride: 101 mEq/L (ref 96–112)
Creatinine, Ser: 0.74 mg/dL (ref 0.50–1.10)
GFR calc Af Amer: >90 mL/min (ref >90)
GFR calc non Af Amer: >90 mL/min (ref >90)
Glucose, Bld: 120 mg/dL — ABNORMAL HIGH (ref 70–99)
Potassium: 3.4 mEq/L — ABNORMAL LOW (ref 3.5–5.1)
Sodium: 137 mEq/L (ref 135–145)

## 2011-01-08 LAB — CBC
HCT: 35.9 % — ABNORMAL LOW (ref 36.0–46.0)
Hemoglobin: 12.4 g/dL (ref 12.0–15.0)
MCH: 32.3 pg (ref 26.0–34.0)
MCHC: 34.5 g/dL (ref 30.0–36.0)
MCV: 93.5 fL (ref 78.0–100.0)
Platelets: 161 10*3/uL (ref 150–400)
RBC: 3.84 MIL/uL — ABNORMAL LOW (ref 3.87–5.11)
RDW: 12.5 % (ref 11.5–15.5)
WBC: 10.2 10*3/uL (ref 4.0–10.5)

## 2011-01-08 LAB — LIPASE, BLOOD: Lipase: 16 U/L (ref 11–59)

## 2011-01-08 MED ORDER — HYDROMORPHONE HCL PF 1 MG/ML IJ SOLN
1.0000 mg | Freq: Once | INTRAMUSCULAR | Status: AC
  Administered 2011-01-08: 1 mg via INTRAVENOUS
  Filled 2011-01-08: qty 1

## 2011-01-08 MED ORDER — ONDANSETRON HCL 4 MG/2ML IJ SOLN
4.0000 mg | Freq: Once | INTRAMUSCULAR | Status: AC
  Administered 2011-01-08: 4 mg via INTRAVENOUS
  Filled 2011-01-08: qty 2

## 2011-01-08 MED ORDER — IOHEXOL 300 MG/ML  SOLN
100.0000 mL | Freq: Once | INTRAMUSCULAR | Status: AC | PRN
  Administered 2011-01-08: 100 mL via INTRAVENOUS

## 2011-01-08 MED ORDER — SODIUM CHLORIDE 0.9 % IV SOLN
Freq: Once | INTRAVENOUS | Status: AC
  Administered 2011-01-08: 02:00:00 via INTRAVENOUS

## 2011-01-08 NOTE — ED Notes (Signed)
Family member to nurses station & states pt is in pain at this time.

## 2011-01-08 NOTE — ED Notes (Signed)
Output in urinary catheter bag: 800 mL

## 2011-01-08 NOTE — ED Notes (Signed)
Patient is alert and oriented x 4 with respirations even and unlabored.  NAD at this time.  Discharge instructions reviewed with patient and patient verbalized understanding.  Pt ambulated to lobby with steady gait, and son to transport pt home.

## 2011-01-08 NOTE — ED Notes (Signed)
Pt reports pressure in lower abdomen started today after lunch.  Pt states she has been having diarrhea for approx two days.

## 2011-01-09 LAB — URINE CULTURE
Colony Count: NO GROWTH
Culture  Setup Time: 201301052215
Culture: NO GROWTH

## 2011-01-10 IMAGING — CR DG LUMBAR SPINE 2-3V
1 series · 3 of 3 positions shown · non-contrast
Comparison: none

REASON FOR EXAM: trauma
COMMENTS:

PROCEDURE:     DXR - DXR LUMBAR SPINE AP AND LATERAL  - August 27, 2009  [DATE]
RESULT:     Comparison: None

[Series 1: view not recorded · 0.17mm/px · 3 of 3 slices shown]
[im 1/3]
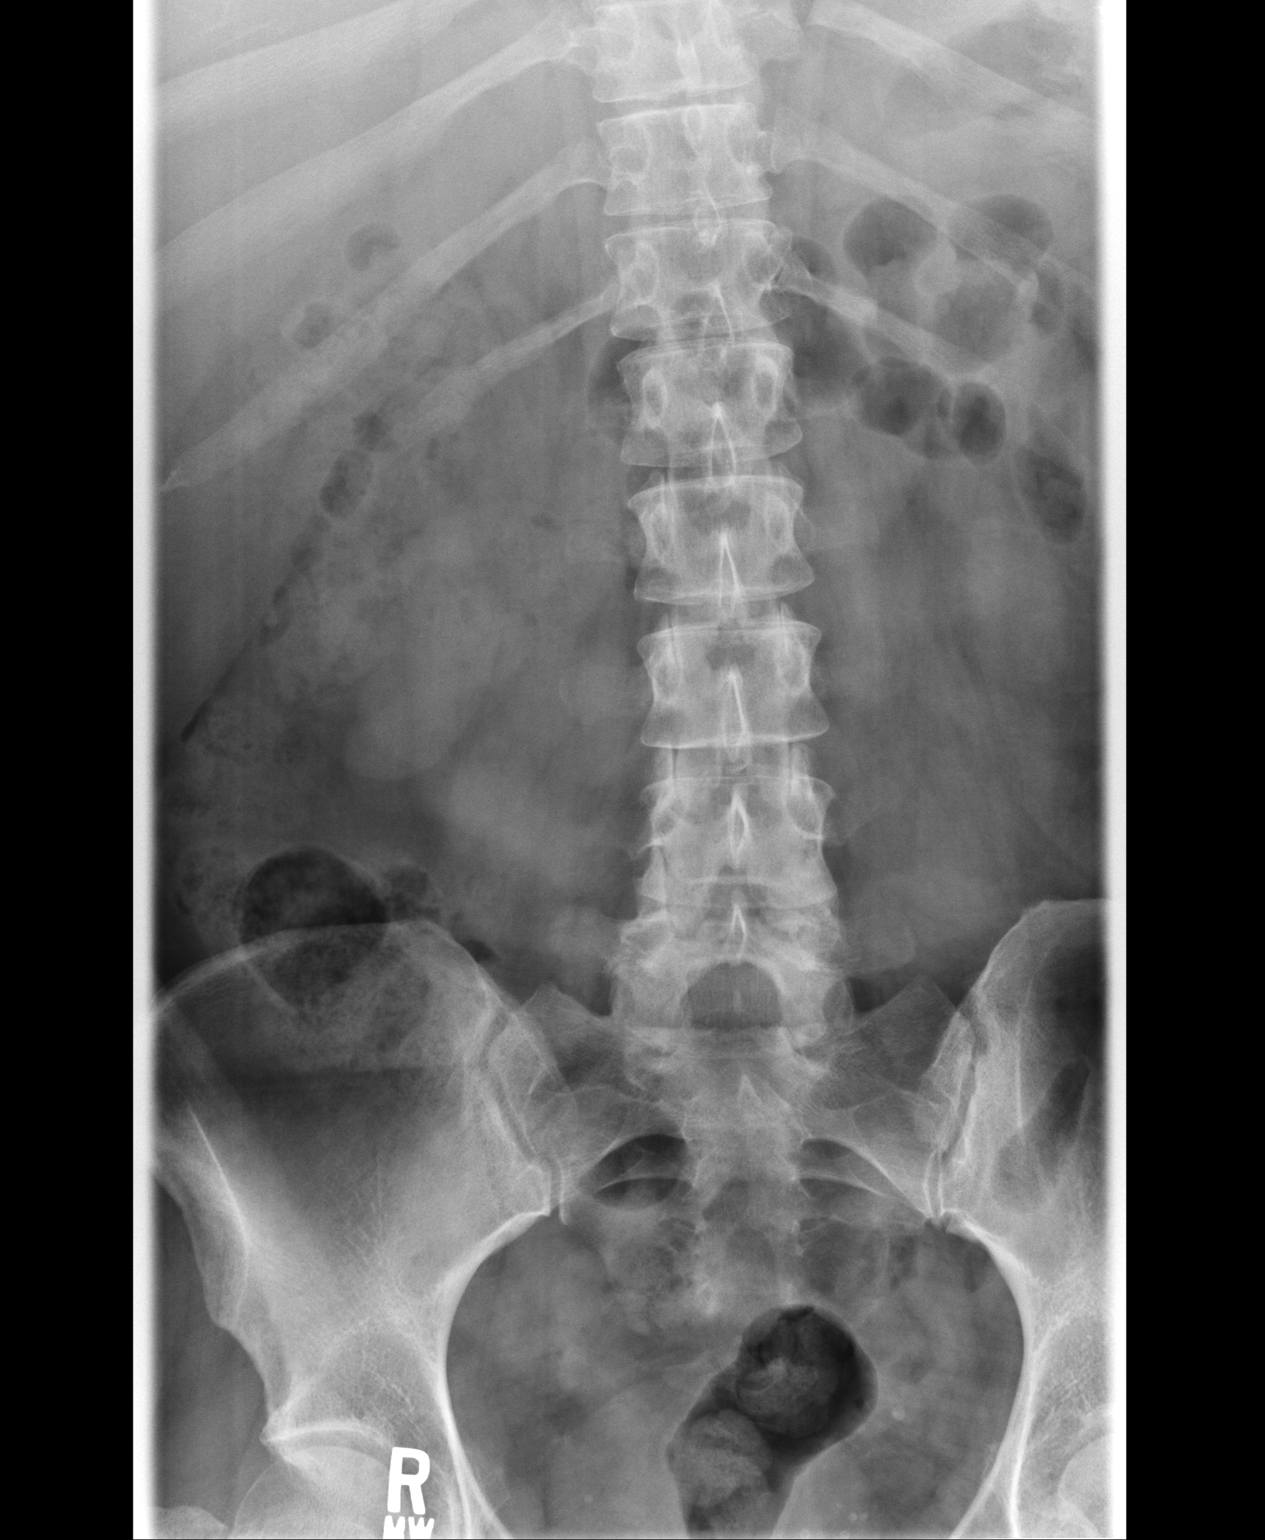
[im 2/3]
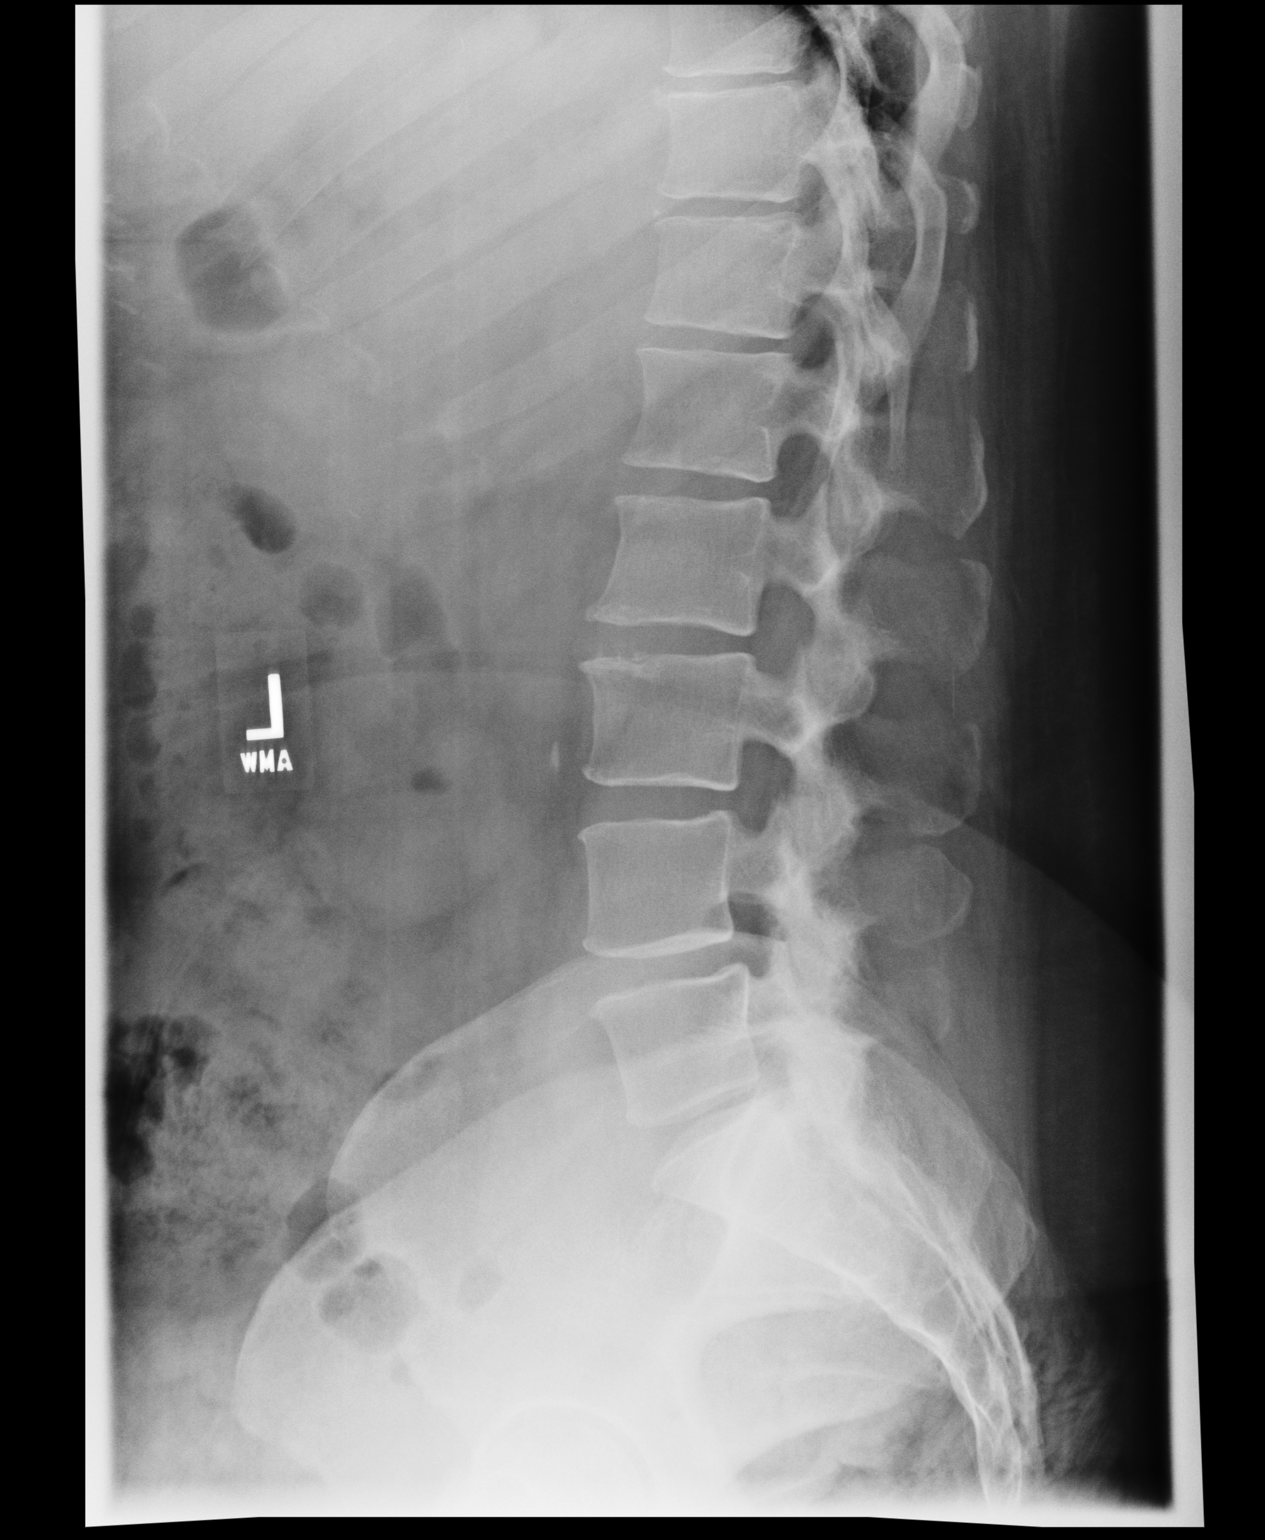
[im 3/3]
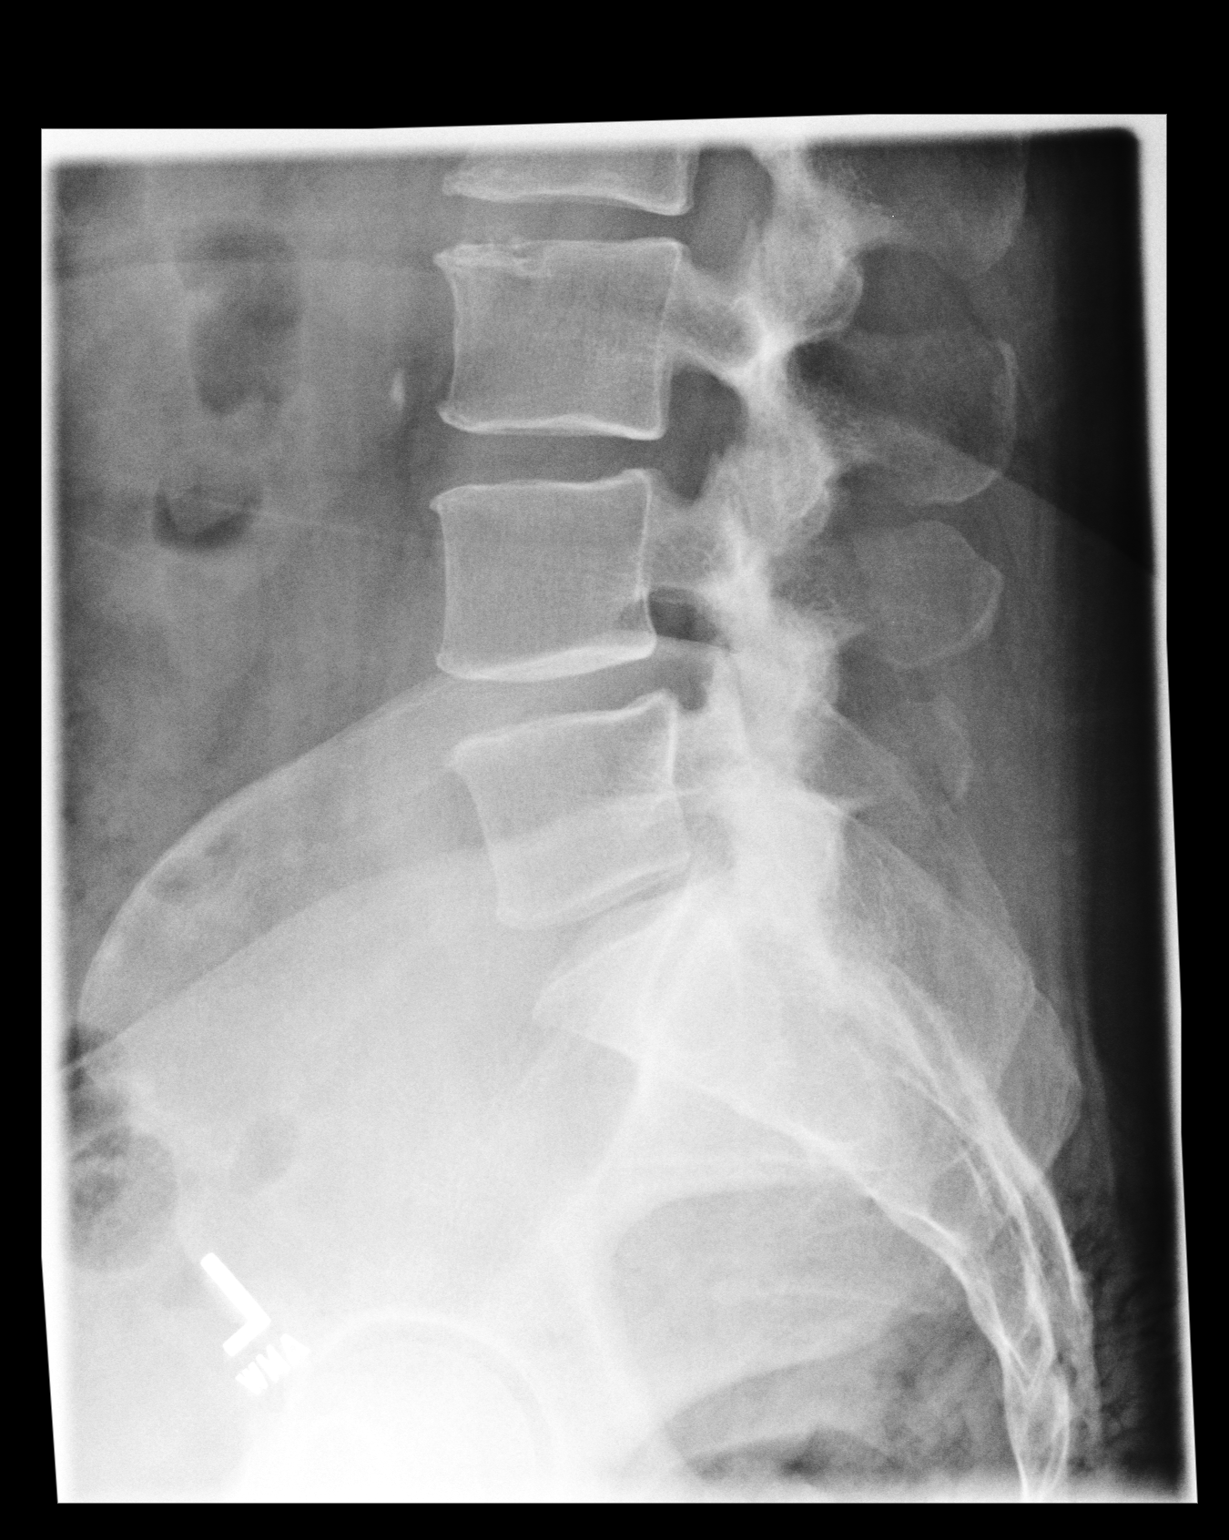

[3 of 3 positions shown; findings below may reference images not displayed]

FINDINGS: AP and lateral views of the lumbar spine and a coned down view of the
lumbosacral junction are provided.

There are 5 nonrib bearing lumbar-type vertebral bodies. The vertebral body
heights are maintained. The alignment is anatomic. There is no
spondylolysis. There is no acute fracture or static listhesis. The disc
spaces are maintained.

The SI joints are unremarkable.
IMPRESSION: 1. No acute osseous abnormality of the lumbar spine.

## 2011-03-17 ENCOUNTER — Emergency Department (HOSPITAL_COMMUNITY)
Admission: EM | Admit: 2011-03-17 | Discharge: 2011-03-17 | Disposition: A | Discharge status: Home / Self Care | Payer: Self-pay | Attending: Emergency Medicine | Admitting: Emergency Medicine

## 2011-03-17 ENCOUNTER — Emergency Department (HOSPITAL_COMMUNITY): Payer: Self-pay

## 2011-03-17 ENCOUNTER — Other Ambulatory Visit: Payer: Self-pay

## 2011-03-17 ENCOUNTER — Encounter (HOSPITAL_COMMUNITY): Payer: Self-pay | Admitting: *Deleted

## 2011-03-17 DIAGNOSIS — R51 Headache: Secondary | ICD-10-CM | POA: Insufficient documentation

## 2011-03-17 DIAGNOSIS — I10 Essential (primary) hypertension: Secondary | ICD-10-CM | POA: Insufficient documentation

## 2011-03-17 DIAGNOSIS — R059 Cough, unspecified: Secondary | ICD-10-CM | POA: Insufficient documentation

## 2011-03-17 DIAGNOSIS — R05 Cough: Secondary | ICD-10-CM | POA: Insufficient documentation

## 2011-03-17 DIAGNOSIS — J40 Bronchitis, not specified as acute or chronic: Secondary | ICD-10-CM

## 2011-03-17 DIAGNOSIS — R509 Fever, unspecified: Secondary | ICD-10-CM | POA: Insufficient documentation

## 2011-03-17 DIAGNOSIS — Z79899 Other long term (current) drug therapy: Secondary | ICD-10-CM | POA: Insufficient documentation

## 2011-03-17 HISTORY — DX: Dorsalgia, unspecified: M54.9

## 2011-03-17 MED ORDER — ALBUTEROL SULFATE HFA 108 (90 BASE) MCG/ACT IN AERS
2.0000 | INHALATION_SPRAY | RESPIRATORY_TRACT | Status: DC | PRN
  Administered 2011-03-17: 2 via RESPIRATORY_TRACT
  Filled 2011-03-17: qty 6.7

## 2011-03-17 MED ORDER — SODIUM CHLORIDE 0.9 % IN NEBU
INHALATION_SOLUTION | RESPIRATORY_TRACT | Status: AC
  Administered 2011-03-17: 3 mL
  Filled 2011-03-17: qty 3

## 2011-03-17 MED ORDER — ALBUTEROL SULFATE (5 MG/ML) 0.5% IN NEBU
5.0000 mg | INHALATION_SOLUTION | Freq: Once | RESPIRATORY_TRACT | Status: AC
  Administered 2011-03-17: 5 mg via RESPIRATORY_TRACT
  Filled 2011-03-17: qty 1

## 2011-03-17 MED ORDER — ONDANSETRON HCL 4 MG PO TABS
8.0000 mg | ORAL_TABLET | Freq: Once | ORAL | Status: AC
  Administered 2011-03-17: 8 mg via ORAL
  Filled 2011-03-17: qty 2

## 2011-03-17 MED ORDER — ONDANSETRON 8 MG PO TBDP: 8.0000 mg | ORAL_TABLET | Freq: Three times a day (TID) | ORAL | Status: AC | PRN

## 2011-03-17 NOTE — ED Notes (Signed)
Cough fever, n/v/d.  Yellow sputum. With blood.  headache

## 2011-03-17 NOTE — ED Provider Notes (Signed)
History     CSN: [id number]  Arrival date & time 03/17/11  1714   First MD Initiated Contact with Patient 03/17/11 1745      Chief Complaint  Patient presents with  . Cough    (Consider location/radiation/quality/duration/timing/severity/associated sxs/prior treatment) HPI  Patient presents today with cough for 2 days with fever yesterday. Her cough is productive of yellow sputum with some blood streaks there is a day. She has had some headache and sharp anterior chest pains with coughing. She denies any shortness of breath. She has had wheezing in the past with infections.  Past Medical History  Diagnosis Date  . Brittle bone disease   . Asthma   . Thyroid disease   . Hypertension   . Back pain     Past Surgical History  Procedure Date  . Abdominal hysterectomy     History reviewed. No pertinent family history.  History  Substance Use Topics  . Smoking status: Never Smoker   . Smokeless tobacco: Not on file  . Alcohol Use: No     occ    OB History    Grav Para Term Preterm Abortions TAB SAB Ect Mult Living                  Review of Systems  All other systems reviewed and are negative.    Allergies  Vicodin  Home Medications     BP 121/86  Pulse 79  Temp(Src) 98.7 F (37.1 C) (Oral)  Resp 22  Ht 5' 2"  (1.575 m)  Wt 228 lb (103.42 kg)  BMI 41.70 kg/m2  SpO2 99%  Physical Exam  Nursing note and vitals reviewed. Constitutional: She is oriented to person, place, and time. She appears well-developed and well-nourished.  HENT:  Head: Normocephalic and atraumatic.  Eyes: Conjunctivae are normal. Pupils are equal, round, and reactive to light.  Neck: Normal range of motion. Neck supple.  Cardiovascular: Normal rate and regular rhythm.        Coughing  Pulmonary/Chest: Effort normal and breath sounds normal.       Coughing  Abdominal: Soft. Bowel sounds are normal.  Musculoskeletal: Normal range of motion.  Neurological: She is alert and  oriented to person, place, and time.  Skin: Skin is warm and dry.  Psychiatric: She has a normal mood and affect.    ED Course  Procedures (including critical care time)  Labs Reviewed - No data to display Dg Chest 2 View  03/17/2011  *RADIOLOGY REPORT*  Clinical Data: Cough and fever  CHEST - 2 VIEW  Comparison: None.  Findings: Borderline cardiomegaly.  Clear lungs.  No pneumothorax and no pleural effusion.  Low volumes.  IMPRESSION: No active cardiopulmonary disease.  Original Report Authenticated By: Jamas Lav, M.D.     No diagnosis found.  Date: 03/17/2011  Rate:64  Rhythm: normal sinus rhythm  QRS Axis: normal  Intervals: normal  ST/T Wave abnormalities: normal  Conduction Disutrbances: none  Narrative Interpretation: unremarkable    e    MDM  Patient had a breathing treatment and feels like she is not wheezing as much as she was before. Her lungs continue to remain clear to auscultation. Shortly be given an albuterol MDI. Her sats are normal. She had did have some nausea with this and was given Zofran with good results. She'll be given a prescription for Zofran       Shaune Pollack, MD 03/17/11 1932

## 2011-03-17 NOTE — Discharge Instructions (Signed)
Antibiotic Nonuse  Your caregiver felt that the infection or problem was not one that would be helped with an antibiotic. Infections may be caused by viruses or bacteria. Only a caregiver can tell which one of these is the likely cause of an illness. A cold is the most common cause of infection in both adults and children. A cold is a virus. Antibiotic treatment will have no effect on a viral infection. Viruses can lead to many lost days of work caring for sick children and many missed days of school. Children may catch as many as 10 "colds" or "flus" per year during which they can be tearful, cranky, and uncomfortable. The goal of treating a virus is aimed at keeping the ill person comfortable. Antibiotics are medications used to help the body fight bacterial infections. There are relatively few types of bacteria that cause infections but there are hundreds of viruses. While both viruses and bacteria cause infection they are very different types of germs. A viral infection will typically go away by itself within 7 to 10 days. Bacterial infections may spread or get worse without antibiotic treatment. Examples of bacterial infections are:  Sore throats (like strep throat or tonsillitis).   Infection in the lung (pneumonia).   Ear and skin infections.  Examples of viral infections are:  Colds or flus.   Most coughs and bronchitis.   Sore throats not caused by Strep.   Runny noses.  It is often best not to take an antibiotic when a viral infection is the cause of the problem. Antibiotics can kill off the helpful bacteria that we have inside our body and allow harmful bacteria to start growing. Antibiotics can cause side effects such as allergies, nausea, and diarrhea without helping to improve the symptoms of the viral infection. Additionally, repeated uses of antibiotics can cause bacteria inside of our body to become resistant. That resistance can be passed onto harmful bacterial. The next time  you have an infection it may be harder to treat if antibiotics are used when they are not needed. Not treating with antibiotics allows our own immune system to develop and take care of infections more efficiently. Also, antibiotics will work better for [place] when they are prescribed for bacterial infections. Treatments for a child that is ill may include:  Give extra fluids throughout the day to stay hydrated.   Get plenty of rest.   Only give your child over-the-counter or prescription medicines for pain, discomfort, or fever as directed by your caregiver.   The use of a cool mist humidifier may help stuffy noses.   Cold medications if suggested by your caregiver.  Your caregiver may decide to start you on an antibiotic if:  The problem you were seen for today continues for a longer length of time than expected.   You develop a secondary bacterial infection.  SEEK MEDICAL CARE IF:  Fever lasts longer than 5 days.   Symptoms continue to get worse after 5 to 7 days or become severe.   Difficulty in breathing develops.   Signs of dehydration develop (poor drinking, rare urinating, dark colored urine).   Changes in behavior or worsening tiredness (listlessness or lethargy).  Document Released: 02/28/2001 Document Revised: 12/09/2010 Document Reviewed: 08/27/2008 [facility] Patient Information 2012 [place].Bronchitis Bronchitis is a problem of the air tubes leading to your lungs. This problem makes it hard for air to get in and out of the lungs. You may cough a lot because your air tubes are  narrow. Going without care can cause lasting (chronic) bronchitis. HOME CARE   Drink enough fluids to keep your pee (urine) clear or pale yellow.   Use a cool mist humidifier.   Quit smoking if you smoke. If you keep smoking, the bronchitis might not get better.   Only take medicine as told by your doctor.  GET HELP RIGHT AWAY IF:   Coughing keeps you awake.   You start to wheeze.    You become more sick or weak.   You have a hard time breathing or get short of breath.   You cough up blood.   Coughing lasts more than 2 weeks.   You have a fever.   Your baby is older than 3 months with a rectal temperature of 102 F (38.9 C) or higher.   Your baby is 47 months old or younger with a rectal temperature of 100.4 F (38 C) or higher.  MAKE SURE YOU:  Understand these instructions.   Will watch your condition.   Will get help right away if you are not doing well or get worse.  Document Released: 06/08/2007 Document Revised: 12/09/2010 Document Reviewed: 11/21/2008 [facility] Patient Information 2012 [place].

## 2011-05-13 ENCOUNTER — Emergency Department: Payer: Self-pay | Admitting: Emergency Medicine

## 2011-05-13 LAB — URINALYSIS, COMPLETE
Bilirubin,UR: NEGATIVE
Blood: NEGATIVE
Glucose,UR: NEGATIVE mg/dL (ref 0–75)
Hyaline Cast: 1
Ketone: NEGATIVE
Leukocyte Esterase: NEGATIVE
Nitrite: NEGATIVE
Ph: 5 (ref 4.5–8.0)
Protein: NEGATIVE
RBC,UR: 1 /HPF (ref 0–5)
Specific Gravity: 1.026 (ref 1.003–1.030)
Squamous Epithelial: 4
WBC UR: 3 /HPF (ref 0–5)

## 2011-05-13 LAB — COMPREHENSIVE METABOLIC PANEL
Albumin: 3.6 g/dL (ref 3.4–5.0)
Alkaline Phosphatase: 84 U/L (ref 50–136)
Anion Gap: 7 (ref 7–16)
BUN: 11 mg/dL (ref 7–18)
Bilirubin,Total: 0.4 mg/dL (ref 0.2–1.0)
Calcium, Total: 8.4 mg/dL — ABNORMAL LOW (ref 8.5–10.1)
Chloride: 107 mmol/L (ref 98–107)
Co2: 30 mmol/L (ref 21–32)
Creatinine: 0.83 mg/dL (ref 0.60–1.30)
EGFR (African American): >60
EGFR (Non-African Amer.): >60
Glucose: 112 mg/dL — ABNORMAL HIGH (ref 65–99)
Osmolality: 287 (ref 275–301)
Potassium: 3.1 mmol/L — ABNORMAL LOW (ref 3.5–5.1)
SGOT(AST): 58 U/L — ABNORMAL HIGH (ref 15–37)
SGPT (ALT): 86 U/L — ABNORMAL HIGH
Sodium: 144 mmol/L (ref 136–145)
Total Protein: 6.9 g/dL (ref 6.4–8.2)

## 2011-05-13 LAB — CBC
HCT: 38.7 % (ref 35.0–47.0)
HGB: 13.5 g/dL (ref 12.0–16.0)
MCH: 33.3 pg (ref 26.0–34.0)
MCHC: 34.9 g/dL (ref 32.0–36.0)
MCV: 95 fL (ref 80–100)
Platelet: 209 10*3/uL (ref 150–440)
RBC: 4.06 10*6/uL (ref 3.80–5.20)
RDW: 13.1 % (ref 11.5–14.5)
WBC: 8.3 10*3/uL (ref 3.6–11.0)

## 2011-05-13 LAB — CK TOTAL AND CKMB (NOT AT ARMC)
CK, Total: 64 U/L (ref 21–215)
CK-MB: 0.5 ng/mL (ref 0.5–3.6)

## 2011-05-13 LAB — TROPONIN I: Troponin-I: <0.02 ng/mL

## 2011-06-22 ENCOUNTER — Emergency Department: Payer: Self-pay

## 2011-06-22 LAB — CBC
HCT: 42.7 % (ref 35.0–47.0)
HGB: 14.8 g/dL (ref 12.0–16.0)
MCH: 32.8 pg (ref 26.0–34.0)
MCHC: 34.6 g/dL (ref 32.0–36.0)
MCV: 95 fL (ref 80–100)
Platelet: 249 10*3/uL (ref 150–440)
RBC: 4.5 10*6/uL (ref 3.80–5.20)
RDW: 13 % (ref 11.5–14.5)
WBC: 10.6 10*3/uL (ref 3.6–11.0)

## 2011-06-22 LAB — BASIC METABOLIC PANEL
Anion Gap: 8 (ref 7–16)
BUN: 11 mg/dL (ref 7–18)
Calcium, Total: 9.1 mg/dL (ref 8.5–10.1)
Chloride: 106 mmol/L (ref 98–107)
Co2: 28 mmol/L (ref 21–32)
Creatinine: 0.84 mg/dL (ref 0.60–1.30)
EGFR (African American): >60
EGFR (Non-African Amer.): >60
Glucose: 105 mg/dL — ABNORMAL HIGH (ref 65–99)
Osmolality: 283 (ref 275–301)
Potassium: 3.3 mmol/L — ABNORMAL LOW (ref 3.5–5.1)
Sodium: 142 mmol/L (ref 136–145)

## 2011-06-22 LAB — CK TOTAL AND CKMB (NOT AT ARMC)
CK, Total: 52 U/L (ref 21–215)
CK-MB: <0.5 ng/mL — ABNORMAL LOW (ref 0.5–3.6)

## 2011-06-22 LAB — TROPONIN I: Troponin-I: <0.02 ng/mL

## 2011-07-14 IMAGING — MR MRI LUMBAR SPINE WITHOUT CONTRAST
5 series · 33 of 48 positions shown · non-contrast
Comparison: None

REASON FOR EXAM: low back pain
COMMENTS:

PROCEDURE:     MR  - MR LUMBAR SPINE WO CONTRAST  - February 28, 2010  [DATE]
RESULT:     MRI LUMBAR SPINE WITHOUT CONTRAST
HISTORY: Low back pain
TECHNIQUE: Multiplanar and multisequence MRI of the lumbar spine were
obtained, without administration of IV contrast.

[Series 2: T2 · sagittal · 4.0mm · 0.94mm/px · 6 of 16 slices shown (1 of 2)]
[im 1/16]
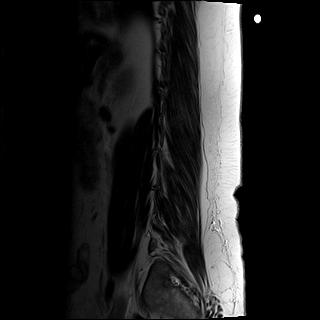
[im 4/16]
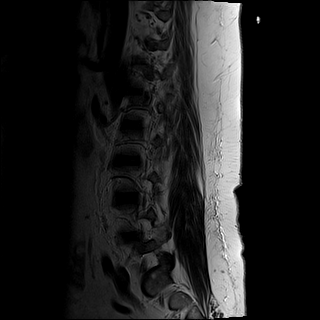
[im 7/16]
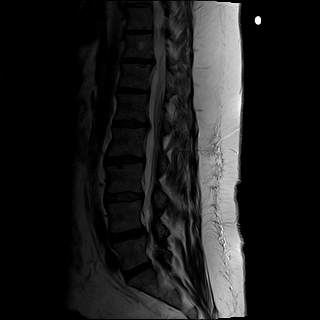
[im 10/16]
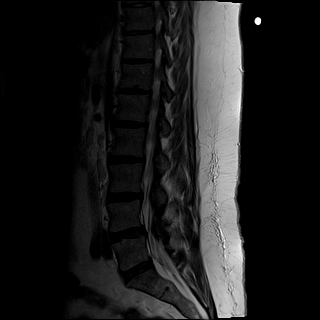
[im 13/16]
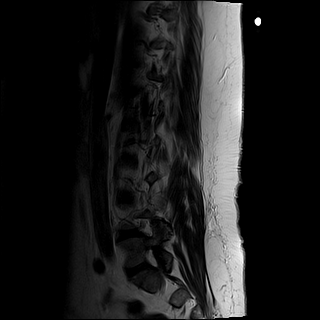
[im 16/16]
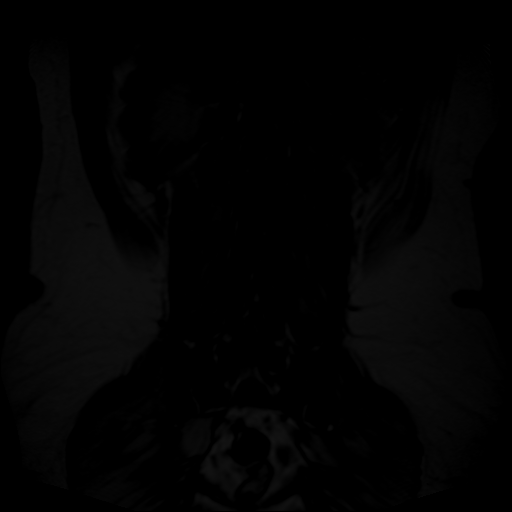

[Series 3: T1 · sagittal · 4.0mm · 0.94mm/px · 6 of 16 slices shown (1 of 2)]
[im 1/16]
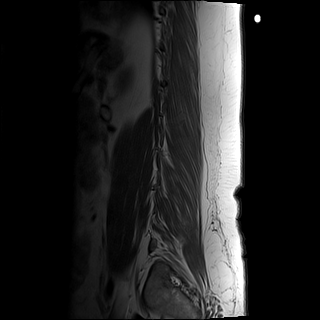
[im 4/16]
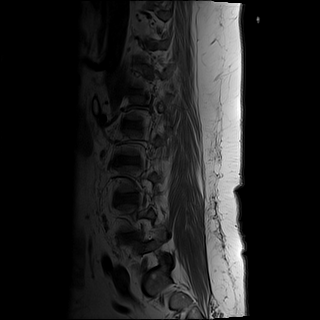
[im 7/16]
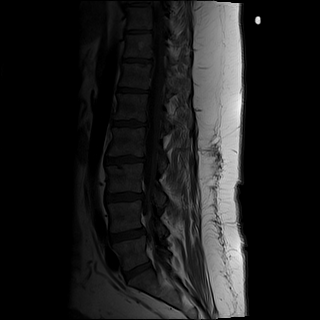
[im 10/16]
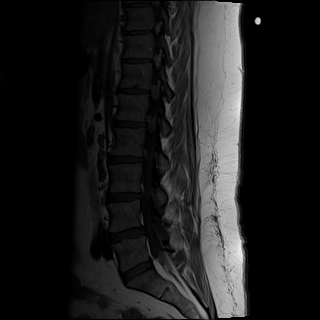
[im 13/16]
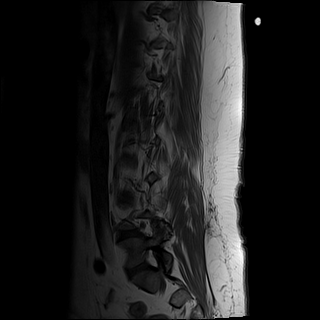
[im 16/16]
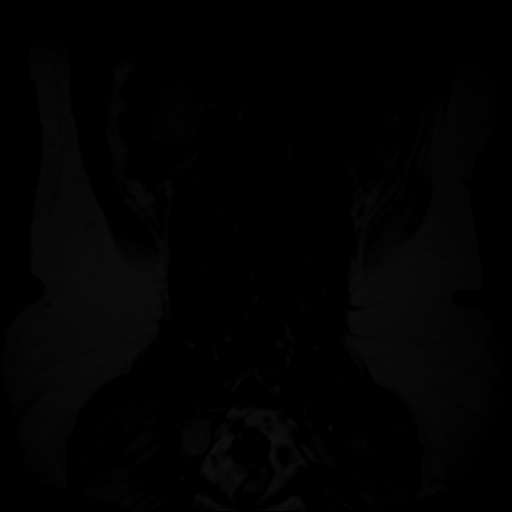

[Series 4: T2 fat-sat · sagittal · 4.0mm · 0.94mm/px · 3 of 16 slices shown]
[im 1/16]
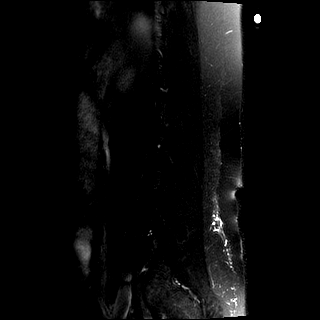
[im 4/16]
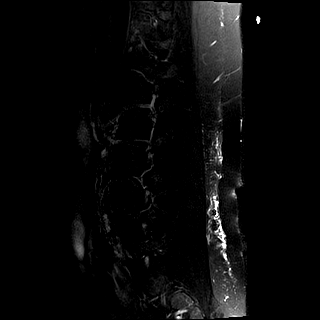
[im 7/16]
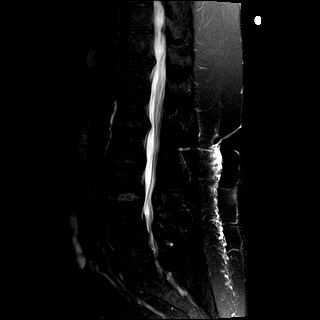

[Series 5: T2 · axial · 4.0mm · 0.78mm/px · z∈[-124,+152]mm · 9 of 39 slices shown (2 of 2)]
[im 1/39]
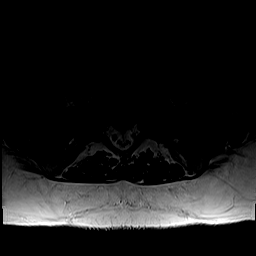
[im 6/39]
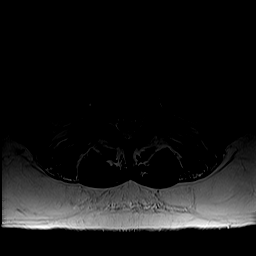
[im 11/39]
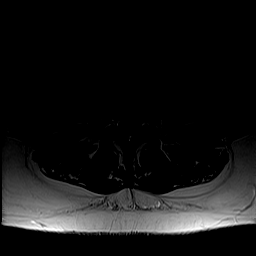
[im 17/39]
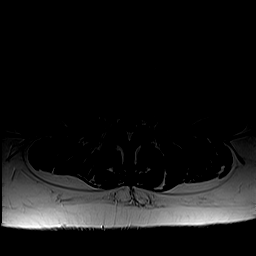
[im 20/39]
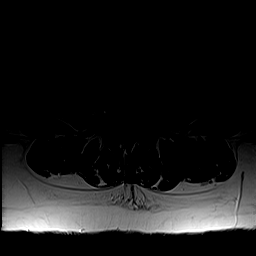
[im 22/39]
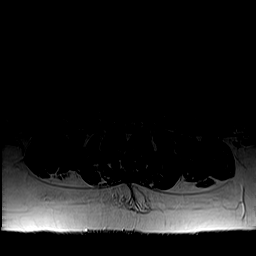
[im 28/39]
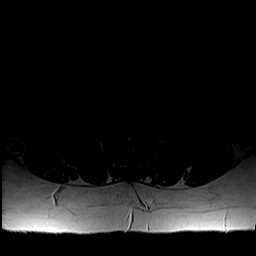
[im 33/39]
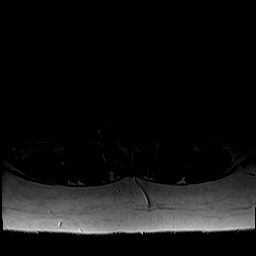
[im 39/39]
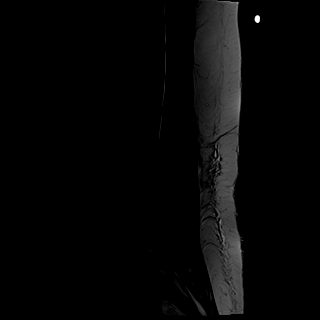

[Series 6: T1 · axial · 4.0mm · 0.39mm/px · z∈[-121,+152]mm · 9 of 38 slices shown (2 of 2)]
[im 1/38]
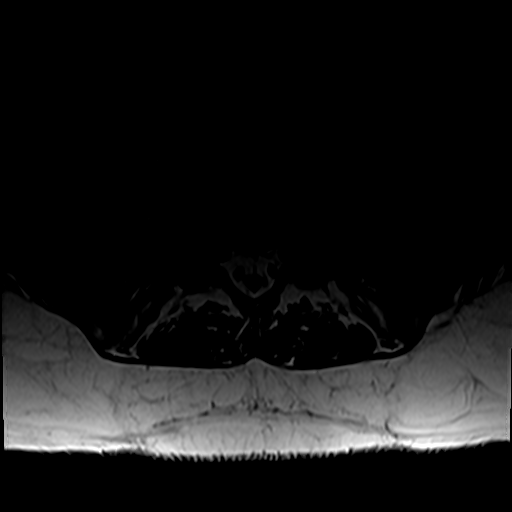
[im 6/38]
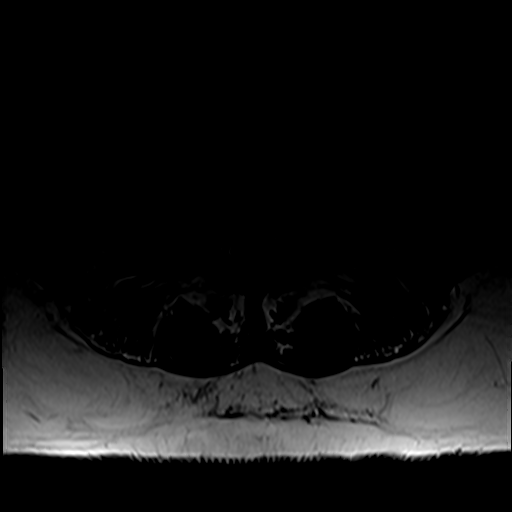
[im 11/38]
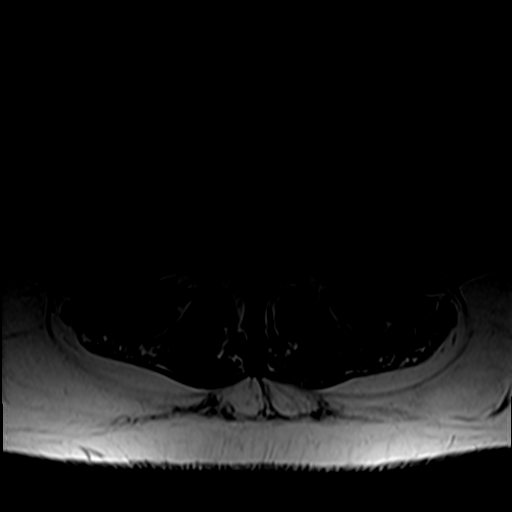
[im 16/38]
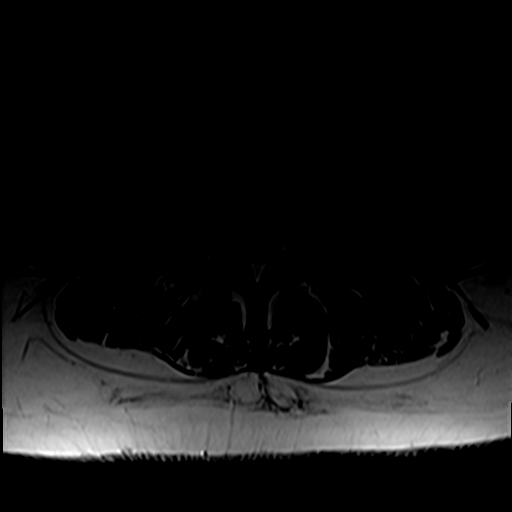
[im 19/38]
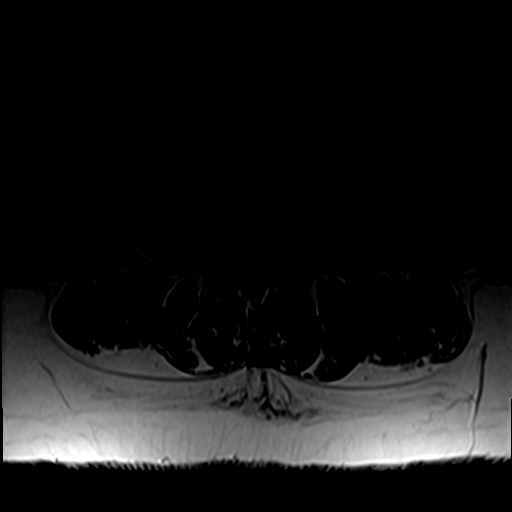
[im 22/38]
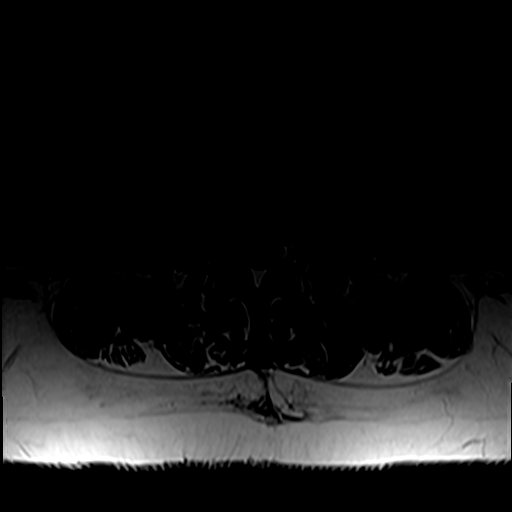
[im 27/38]
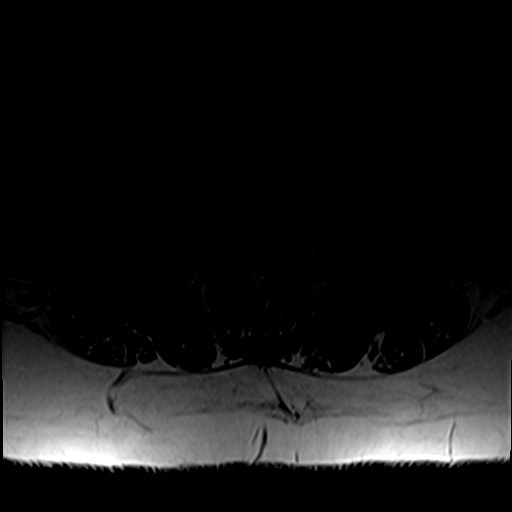
[im 32/38]
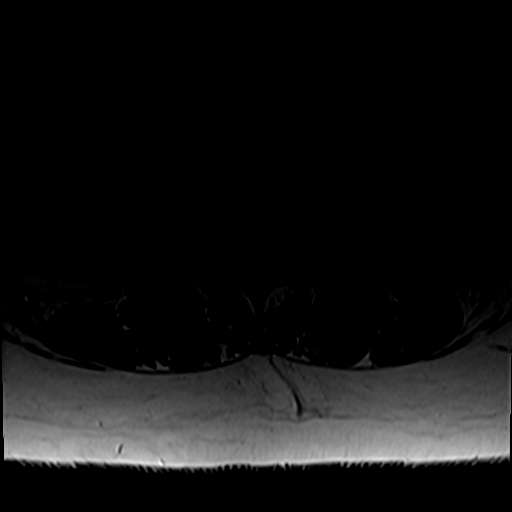
[im 38/38]
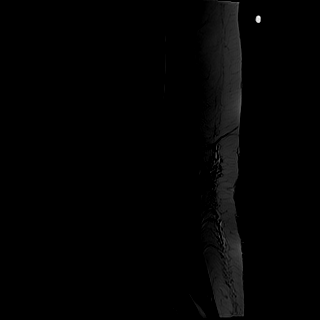

[33 of 48 positions shown; findings below may reference images not displayed]

FINDINGS: The vertebral bodies of the lumbar spine are normal in size and alignment.
There is normal bone marrow signal demonstrated throughout the vertebra. The
intervertebral disc spaces are well-maintained.

The spinal cord is of normal volume and contour. The cord terminates
normally at L1 . The nerve roots of the cauda equina and the filum terminale
have the usual appearance.

The visualized portions of the SI joints are unremarkable.

The imaged intra-abdominal contents are unremarkable.

T10-T11: There is a small central disc protrusion abutting the ventral
thoracic spinal cord.

T12-L1: Small right paracentral disc protrusion. No evidence of neural
foraminal or central stenosis.

L1-L2: Small-moderate central disc protrusion with mild mass effect upon the
ventral thecal sac. No evidence of neural foraminal or central stenosis.

L2-L3: No significant disc bulge. No evidence of neural foraminal or central
stenosis. Mild bilateral facet arthropathy.

L3-L4: No significant disc bulge. No evidence of neural foraminal or central
stenosis. Moderate bilateral facet arthropathy.

L4-L5: Mild broad-based disc bulge. Severe bilateral facet arthropathy.
Mild-moderate spinal stenosis. No evidence of neural foraminal stenosis.

L5-S1: No significant disc bulge. No evidence of neural foraminal or central
stenosis. Mild bilateral facet arthropathy.
IMPRESSION: 1. Lumbar spine spondylosis as described above.

## 2011-08-08 ENCOUNTER — Emergency Department: Payer: Self-pay | Admitting: Emergency Medicine

## 2011-08-08 LAB — BASIC METABOLIC PANEL
Anion Gap: 11 (ref 7–16)
BUN: 9 mg/dL (ref 7–18)
Calcium, Total: 8.7 mg/dL (ref 8.5–10.1)
Chloride: 103 mmol/L (ref 98–107)
Co2: 27 mmol/L (ref 21–32)
Creatinine: 0.75 mg/dL (ref 0.60–1.30)
EGFR (African American): >60
EGFR (Non-African Amer.): >60
Glucose: 110 mg/dL — ABNORMAL HIGH (ref 65–99)
Osmolality: 281 (ref 275–301)
Potassium: 3.2 mmol/L — ABNORMAL LOW (ref 3.5–5.1)
Sodium: 141 mmol/L (ref 136–145)

## 2011-08-08 LAB — URINALYSIS, COMPLETE
Bacteria: NONE SEEN
Bilirubin,UR: NEGATIVE
Blood: NEGATIVE
Glucose,UR: NEGATIVE mg/dL (ref 0–75)
Ketone: NEGATIVE
Leukocyte Esterase: NEGATIVE
Nitrite: NEGATIVE
Ph: 7 (ref 4.5–8.0)
Protein: NEGATIVE
RBC,UR: <1 /HPF (ref 0–5)
Specific Gravity: 1.016 (ref 1.003–1.030)
Squamous Epithelial: 5
WBC UR: <1 /HPF (ref 0–5)

## 2011-08-08 LAB — CBC
HCT: 37.9 % (ref 35.0–47.0)
HGB: 13.7 g/dL (ref 12.0–16.0)
MCH: 34.2 pg — ABNORMAL HIGH (ref 26.0–34.0)
MCHC: 36.2 g/dL — ABNORMAL HIGH (ref 32.0–36.0)
MCV: 95 fL (ref 80–100)
Platelet: 203 10*3/uL (ref 150–440)
RBC: 4 10*6/uL (ref 3.80–5.20)
RDW: 13.1 % (ref 11.5–14.5)
WBC: 10.5 10*3/uL (ref 3.6–11.0)

## 2011-08-08 LAB — TROPONIN I: Troponin-I: <0.02 ng/mL

## 2011-08-08 LAB — CK TOTAL AND CKMB (NOT AT ARMC)
CK, Total: 127 U/L (ref 21–215)
CK-MB: 0.6 ng/mL (ref 0.5–3.6)

## 2011-10-10 ENCOUNTER — Emergency Department: Payer: Self-pay | Admitting: Emergency Medicine

## 2011-10-10 LAB — COMPREHENSIVE METABOLIC PANEL
Albumin: 3.7 g/dL (ref 3.4–5.0)
Alkaline Phosphatase: 80 U/L (ref 50–136)
Anion Gap: 6 — ABNORMAL LOW (ref 7–16)
BUN: 12 mg/dL (ref 7–18)
Bilirubin,Total: 0.4 mg/dL (ref 0.2–1.0)
Calcium, Total: 8.7 mg/dL (ref 8.5–10.1)
Chloride: 104 mmol/L (ref 98–107)
Co2: 34 mmol/L — ABNORMAL HIGH (ref 21–32)
Creatinine: 0.79 mg/dL (ref 0.60–1.30)
EGFR (African American): >60
EGFR (Non-African Amer.): >60
Glucose: 93 mg/dL (ref 65–99)
Osmolality: 286 (ref 275–301)
Potassium: 3.5 mmol/L (ref 3.5–5.1)
SGOT(AST): 40 U/L — ABNORMAL HIGH (ref 15–37)
SGPT (ALT): 61 U/L (ref 12–78)
Sodium: 144 mmol/L (ref 136–145)
Total Protein: 7.5 g/dL (ref 6.4–8.2)

## 2011-10-10 LAB — DRUG SCREEN, URINE
Amphetamines, Ur Screen: NEGATIVE (ref ?–1000)
Barbiturates, Ur Screen: NEGATIVE (ref ?–200)
Benzodiazepine, Ur Scrn: POSITIVE (ref ?–200)
Cannabinoid 50 Ng, Ur ~~LOC~~: NEGATIVE (ref ?–50)
Cocaine Metabolite,Ur ~~LOC~~: NEGATIVE (ref ?–300)
MDMA (Ecstasy)Ur Screen: NEGATIVE (ref ?–500)
Methadone, Ur Screen: NEGATIVE (ref ?–300)
Opiate, Ur Screen: POSITIVE (ref ?–300)
Phencyclidine (PCP) Ur S: NEGATIVE (ref ?–25)
Tricyclic, Ur Screen: NEGATIVE (ref ?–1000)

## 2011-10-10 LAB — CBC
HCT: 38.4 % (ref 35.0–47.0)
HGB: 13.5 g/dL (ref 12.0–16.0)
MCH: 33.6 pg (ref 26.0–34.0)
MCHC: 35.1 g/dL (ref 32.0–36.0)
MCV: 96 fL (ref 80–100)
Platelet: 222 10*3/uL (ref 150–440)
RBC: 4.01 10*6/uL (ref 3.80–5.20)
RDW: 12.5 % (ref 11.5–14.5)
WBC: 8.9 10*3/uL (ref 3.6–11.0)

## 2011-10-10 LAB — URINALYSIS, COMPLETE
Bilirubin,UR: NEGATIVE
Glucose,UR: NEGATIVE mg/dL (ref 0–75)
Ketone: NEGATIVE
Nitrite: NEGATIVE
Ph: 5 (ref 4.5–8.0)
Protein: NEGATIVE
RBC,UR: 10 /HPF (ref 0–5)
Specific Gravity: 1.025 (ref 1.003–1.030)
Squamous Epithelial: 13
WBC UR: 6 /HPF (ref 0–5)

## 2011-10-10 LAB — ETHANOL
Ethanol %: <0.003 % (ref 0.000–0.080)
Ethanol: <3 mg/dL

## 2011-10-10 LAB — SALICYLATE LEVEL: Salicylates, Serum: <1.7 mg/dL

## 2011-10-10 LAB — TSH: Thyroid Stimulating Horm: 2.27 u[IU]/mL

## 2011-10-10 LAB — ACETAMINOPHEN LEVEL: Acetaminophen: <2 ug/mL

## 2011-10-18 ENCOUNTER — Emergency Department: Payer: Self-pay | Admitting: Emergency Medicine

## 2011-10-18 LAB — COMPREHENSIVE METABOLIC PANEL
Albumin: 3.9 g/dL (ref 3.4–5.0)
Alkaline Phosphatase: 75 U/L (ref 50–136)
Anion Gap: 8 (ref 7–16)
BUN: 10 mg/dL (ref 7–18)
Bilirubin,Total: 0.4 mg/dL (ref 0.2–1.0)
Calcium, Total: 9.1 mg/dL (ref 8.5–10.1)
Chloride: 105 mmol/L (ref 98–107)
Co2: 30 mmol/L (ref 21–32)
Creatinine: 0.8 mg/dL (ref 0.60–1.30)
EGFR (African American): >60
EGFR (Non-African Amer.): >60
Glucose: 103 mg/dL — ABNORMAL HIGH (ref 65–99)
Osmolality: 284 (ref 275–301)
Potassium: 4 mmol/L (ref 3.5–5.1)
SGOT(AST): 36 U/L (ref 15–37)
SGPT (ALT): 45 U/L (ref 12–78)
Sodium: 143 mmol/L (ref 136–145)
Total Protein: 8.1 g/dL (ref 6.4–8.2)

## 2011-10-18 LAB — DRUG SCREEN, URINE
Amphetamines, Ur Screen: NEGATIVE (ref ?–1000)
Barbiturates, Ur Screen: NEGATIVE (ref ?–200)
Benzodiazepine, Ur Scrn: POSITIVE (ref ?–200)
Cannabinoid 50 Ng, Ur ~~LOC~~: NEGATIVE (ref ?–50)
Cocaine Metabolite,Ur ~~LOC~~: NEGATIVE (ref ?–300)
MDMA (Ecstasy)Ur Screen: NEGATIVE (ref ?–500)
Methadone, Ur Screen: NEGATIVE (ref ?–300)
Opiate, Ur Screen: POSITIVE (ref ?–300)
Phencyclidine (PCP) Ur S: NEGATIVE (ref ?–25)
Tricyclic, Ur Screen: NEGATIVE (ref ?–1000)

## 2011-10-18 LAB — CBC
HCT: 43.1 % (ref 35.0–47.0)
HGB: 14.9 g/dL (ref 12.0–16.0)
MCH: 32.8 pg (ref 26.0–34.0)
MCHC: 34.6 g/dL (ref 32.0–36.0)
MCV: 95 fL (ref 80–100)
Platelet: 223 10*3/uL (ref 150–440)
RBC: 4.54 10*6/uL (ref 3.80–5.20)
RDW: 12.5 % (ref 11.5–14.5)
WBC: 11.1 10*3/uL — ABNORMAL HIGH (ref 3.6–11.0)

## 2011-10-18 LAB — ACETAMINOPHEN LEVEL: Acetaminophen: <2 ug/mL

## 2011-10-18 LAB — ETHANOL
Ethanol %: <0.003 % (ref 0.000–0.080)
Ethanol: <3 mg/dL

## 2011-10-18 LAB — URINALYSIS, COMPLETE
Bacteria: NONE SEEN
Bilirubin,UR: NEGATIVE
Blood: NEGATIVE
Glucose,UR: NEGATIVE mg/dL (ref 0–75)
Ketone: NEGATIVE
Nitrite: NEGATIVE
Ph: 6 (ref 4.5–8.0)
Protein: NEGATIVE
RBC,UR: 1 /HPF (ref 0–5)
Specific Gravity: 1.02 (ref 1.003–1.030)
Squamous Epithelial: 13
WBC UR: 1 /HPF (ref 0–5)

## 2011-10-18 LAB — TSH: Thyroid Stimulating Horm: 0.75 u[IU]/mL

## 2011-10-18 LAB — SALICYLATE LEVEL: Salicylates, Serum: <1.7 mg/dL

## 2011-11-15 ENCOUNTER — Other Ambulatory Visit: Payer: Self-pay

## 2011-11-15 LAB — COMPREHENSIVE METABOLIC PANEL
Albumin: 4 g/dL (ref 3.4–5.0)
Alkaline Phosphatase: 62 U/L (ref 50–136)
Anion Gap: 7 (ref 7–16)
BUN: 11 mg/dL (ref 7–18)
Bilirubin,Total: 0.7 mg/dL (ref 0.2–1.0)
Calcium, Total: 8.9 mg/dL (ref 8.5–10.1)
Chloride: 104 mmol/L (ref 98–107)
Co2: 28 mmol/L (ref 21–32)
Creatinine: 0.81 mg/dL (ref 0.60–1.30)
EGFR (African American): >60
EGFR (Non-African Amer.): >60
Glucose: 102 mg/dL — ABNORMAL HIGH (ref 65–99)
Osmolality: 277 (ref 275–301)
Potassium: 3.6 mmol/L (ref 3.5–5.1)
SGOT(AST): 41 U/L — ABNORMAL HIGH (ref 15–37)
SGPT (ALT): 52 U/L (ref 12–78)
Sodium: 139 mmol/L (ref 136–145)
Total Protein: 8.3 g/dL — ABNORMAL HIGH (ref 6.4–8.2)

## 2011-11-15 LAB — CBC WITH DIFFERENTIAL/PLATELET
Basophil #: 0.1 10*3/uL (ref 0.0–0.1)
Basophil %: 1.1 %
Eosinophil #: 0.1 10*3/uL (ref 0.0–0.7)
Eosinophil %: 0.7 %
HCT: 44.6 % (ref 35.0–47.0)
HGB: 15.6 g/dL (ref 12.0–16.0)
Lymphocyte #: 2.1 10*3/uL (ref 1.0–3.6)
Lymphocyte %: 22.8 %
MCH: 32.9 pg (ref 26.0–34.0)
MCHC: 34.9 g/dL (ref 32.0–36.0)
MCV: 94 fL (ref 80–100)
Monocyte #: 0.5 x10 3/mm (ref 0.2–0.9)
Monocyte %: 5.1 %
Neutrophil #: 6.5 10*3/uL (ref 1.4–6.5)
Neutrophil %: 70.3 %
Platelet: 230 10*3/uL (ref 150–440)
RBC: 4.73 10*6/uL (ref 3.80–5.20)
RDW: 12.5 % (ref 11.5–14.5)
WBC: 9.2 10*3/uL (ref 3.6–11.0)

## 2011-12-05 DIAGNOSIS — F32A Depression, unspecified: Secondary | ICD-10-CM | POA: Insufficient documentation

## 2011-12-05 DIAGNOSIS — F329 Major depressive disorder, single episode, unspecified: Secondary | ICD-10-CM

## 2011-12-05 HISTORY — DX: Major depressive disorder, single episode, unspecified: F32.9

## 2012-01-13 IMAGING — CR DG FOOT COMPLETE 3+V*L*
1 series · 3 of 3 positions shown · non-contrast
Comparison: none

REASON FOR EXAM: pain
COMMENTS:   May transport without cardiac monitor

PROCEDURE:     DXR - DXR FOOT LT COMP W/OBLIQUES  - August 30, 2010  [DATE]
RESULT:     No acute soft tissue or bony abnormality. No evidence of
fracture or dislocation.

[Series 1: view not recorded · 0.17mm/px · 3 of 3 slices shown]
[im 1/3]
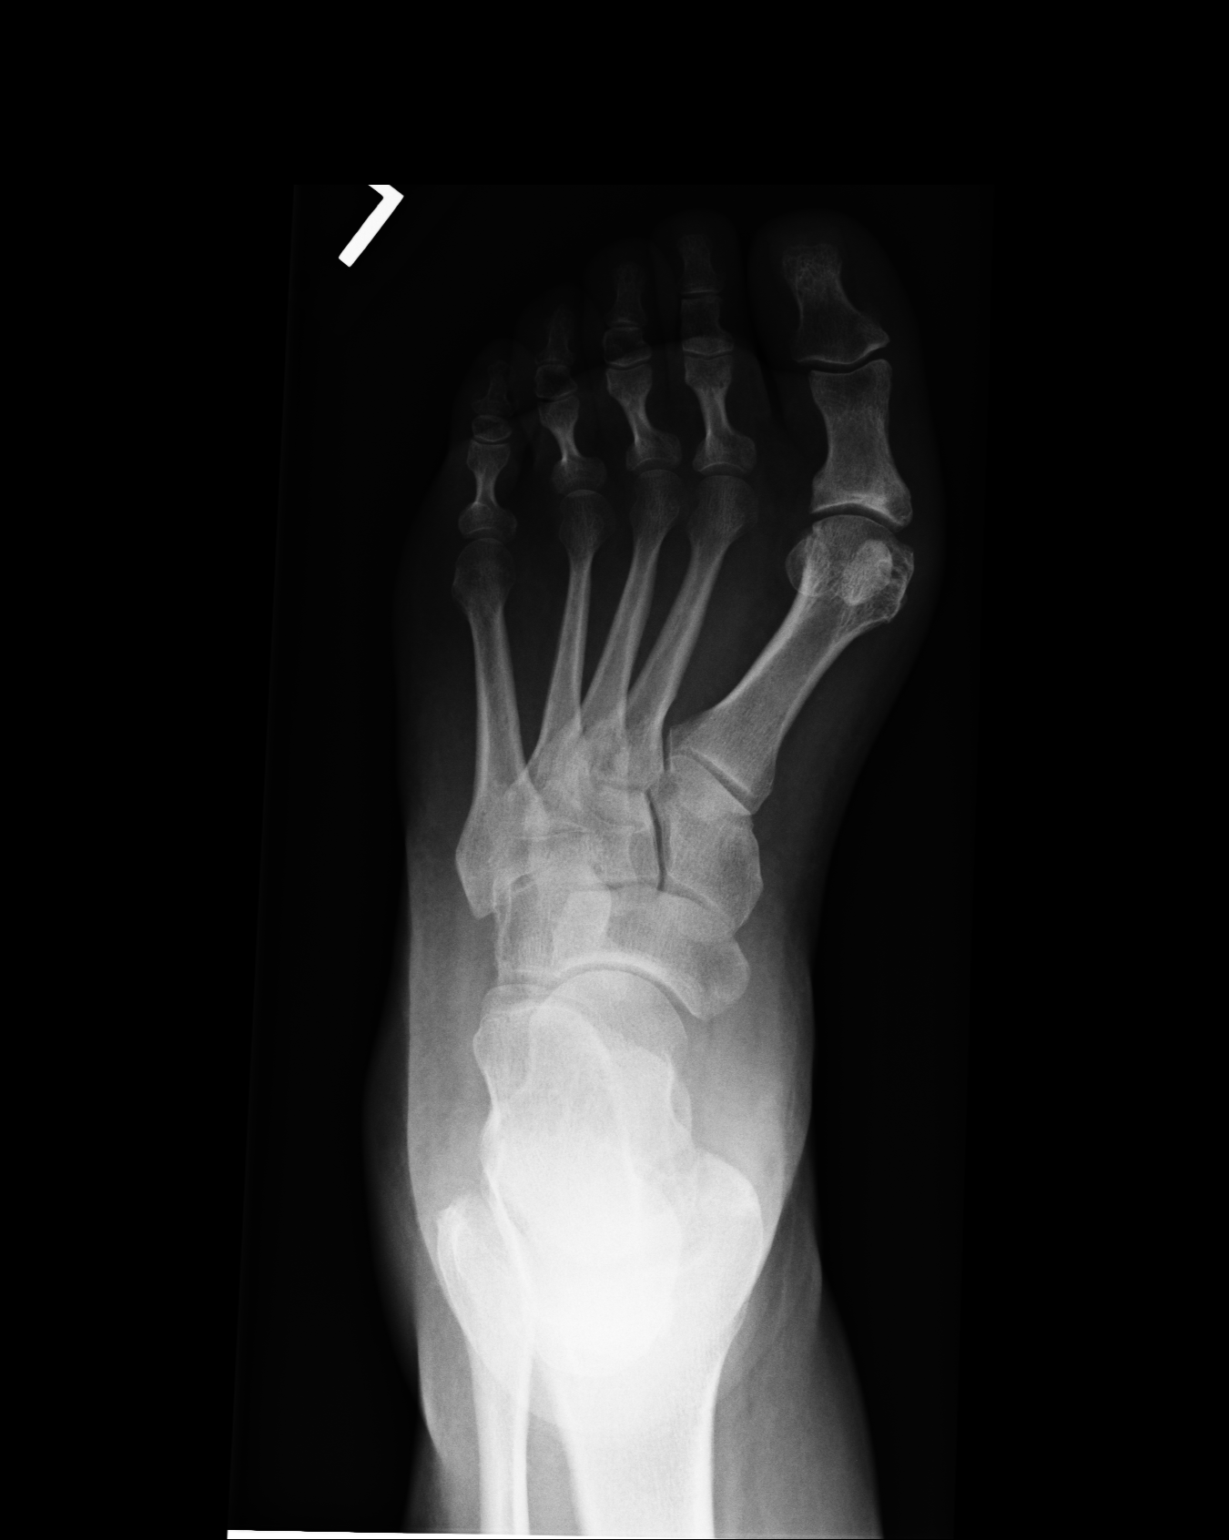
[im 2/3]
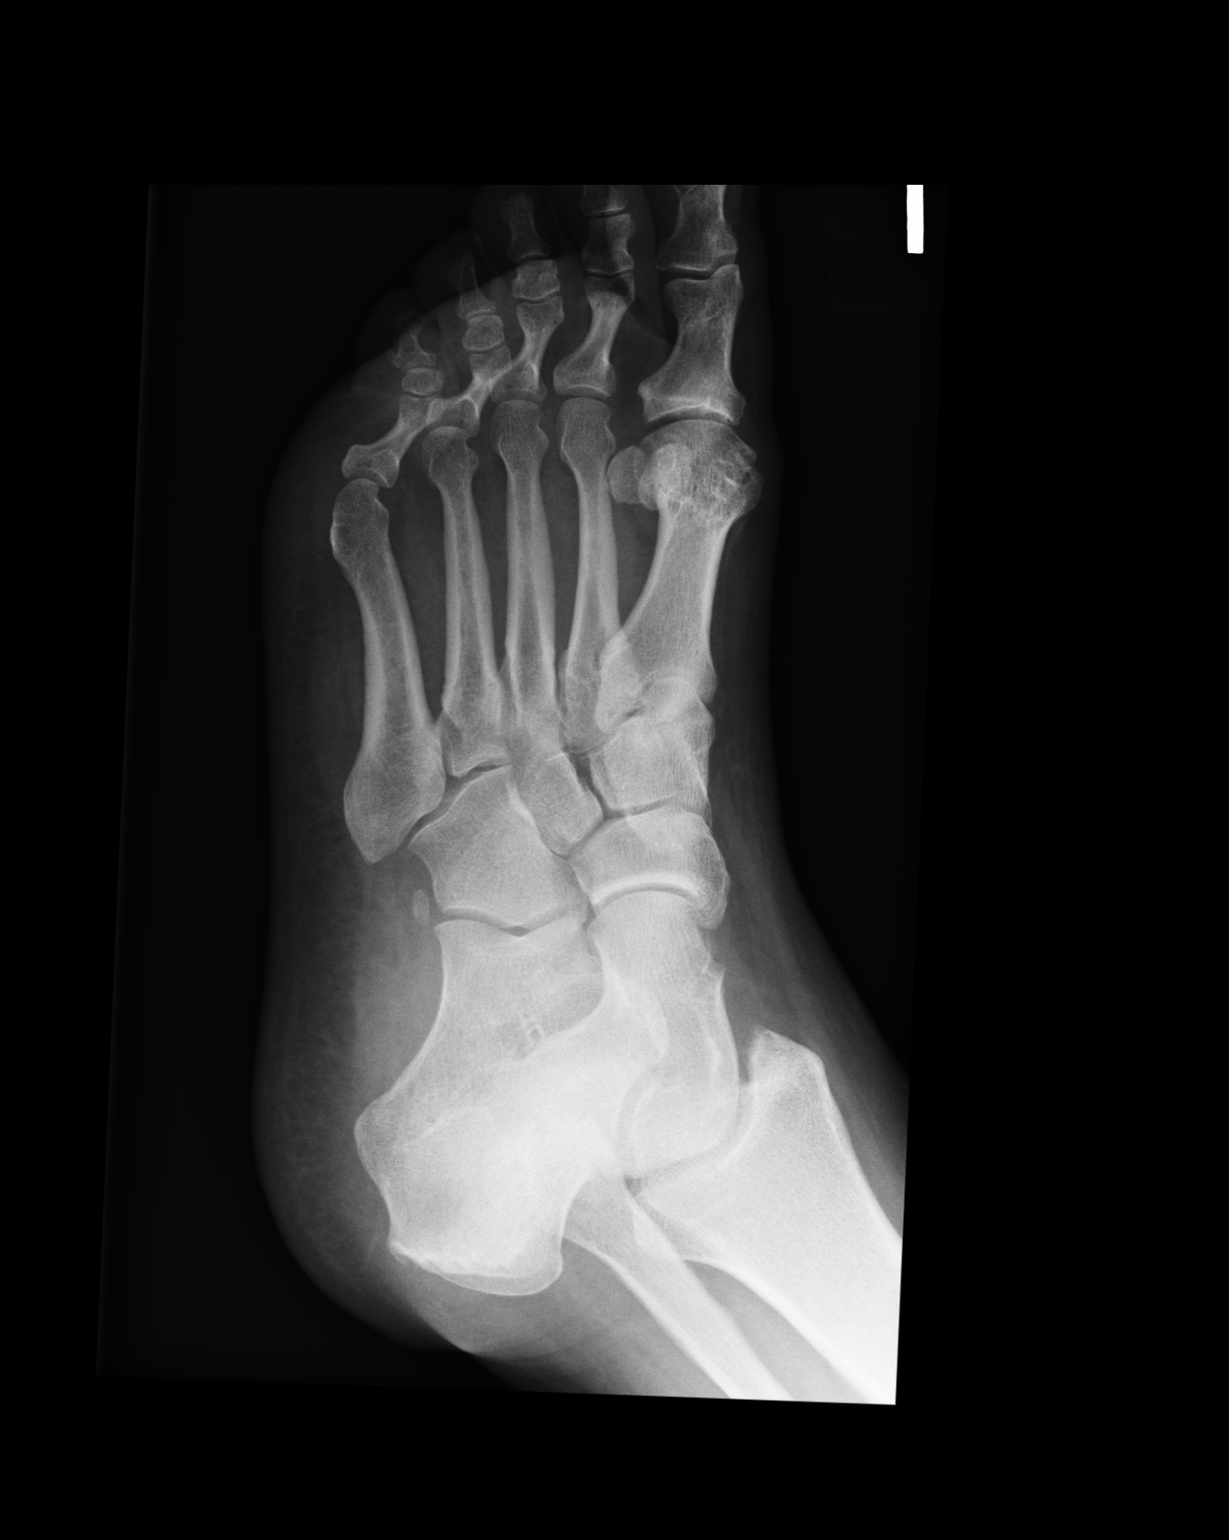
[im 3/3]
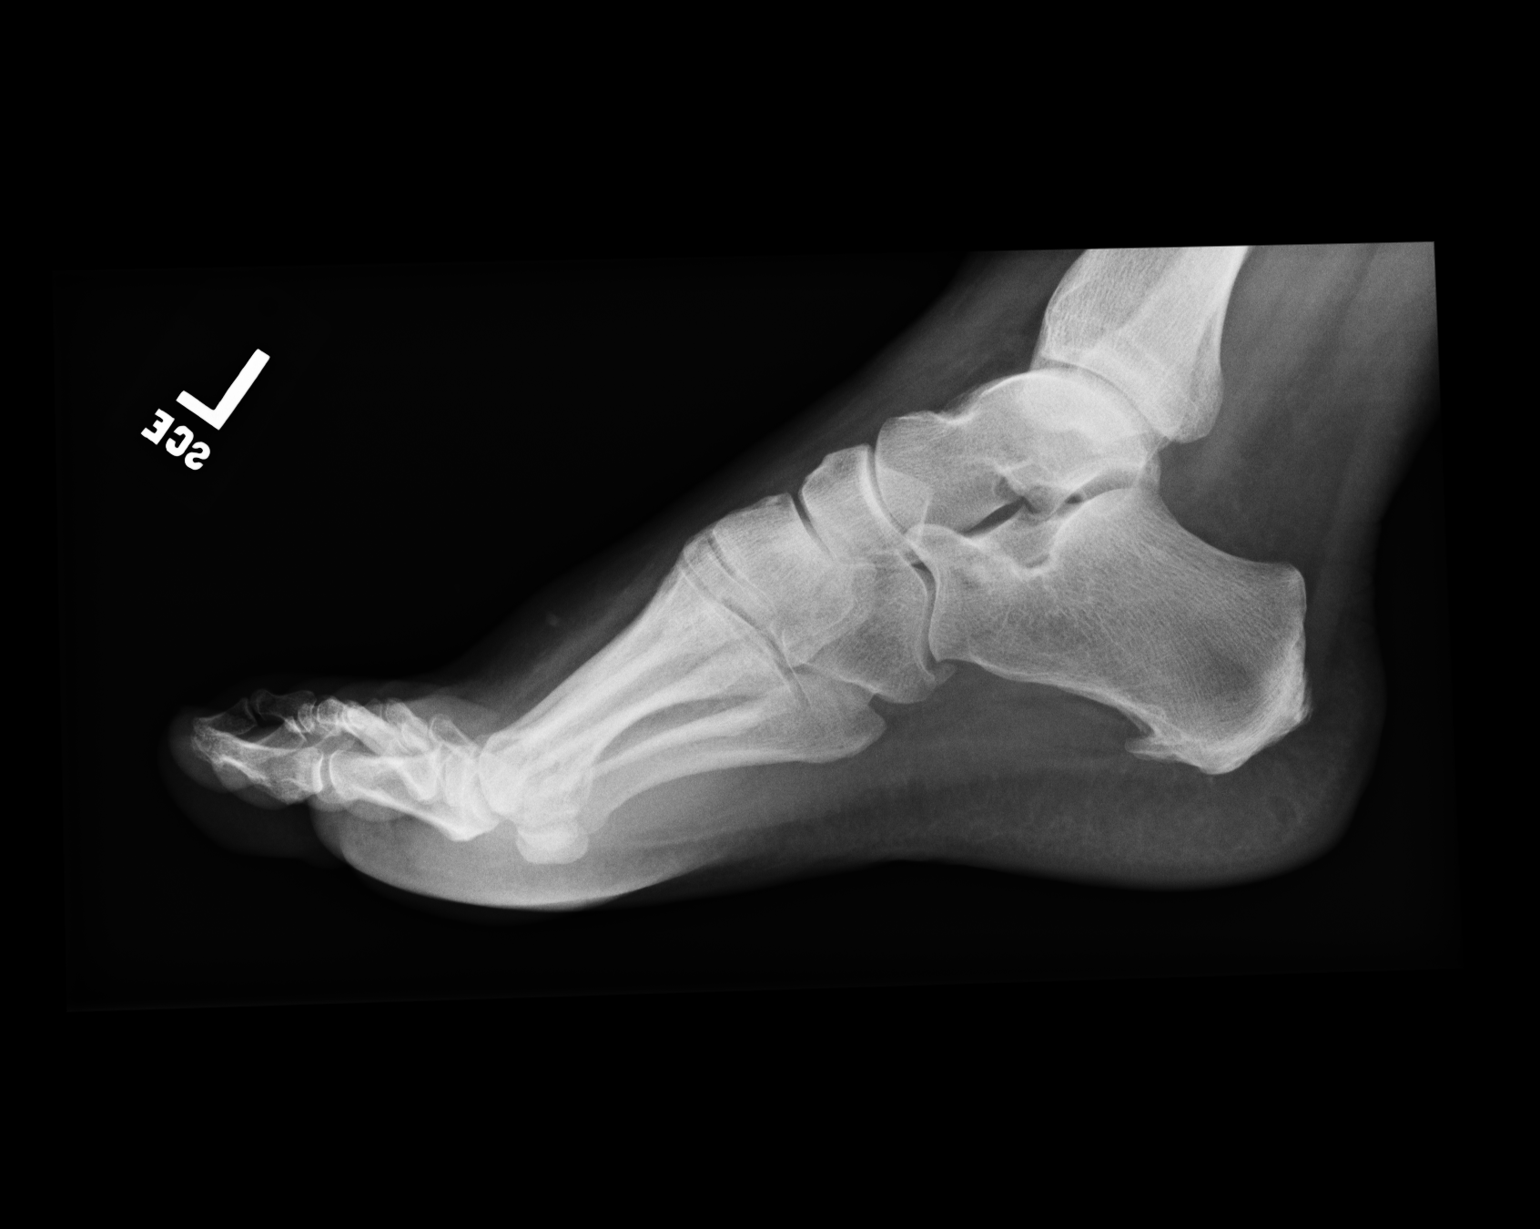

[3 of 3 positions shown; findings below may reference images not displayed]

IMPRESSION: No acute abnormality.

## 2012-01-13 IMAGING — CR DG CHEST 2V
1 series · 2 of 2 positions shown · non-contrast
Comparison: none

REASON FOR EXAM: chest pain
COMMENTS:

[Series 1: view not recorded · 0.17mm/px · 2 of 2 slices shown]
[im 1/2]
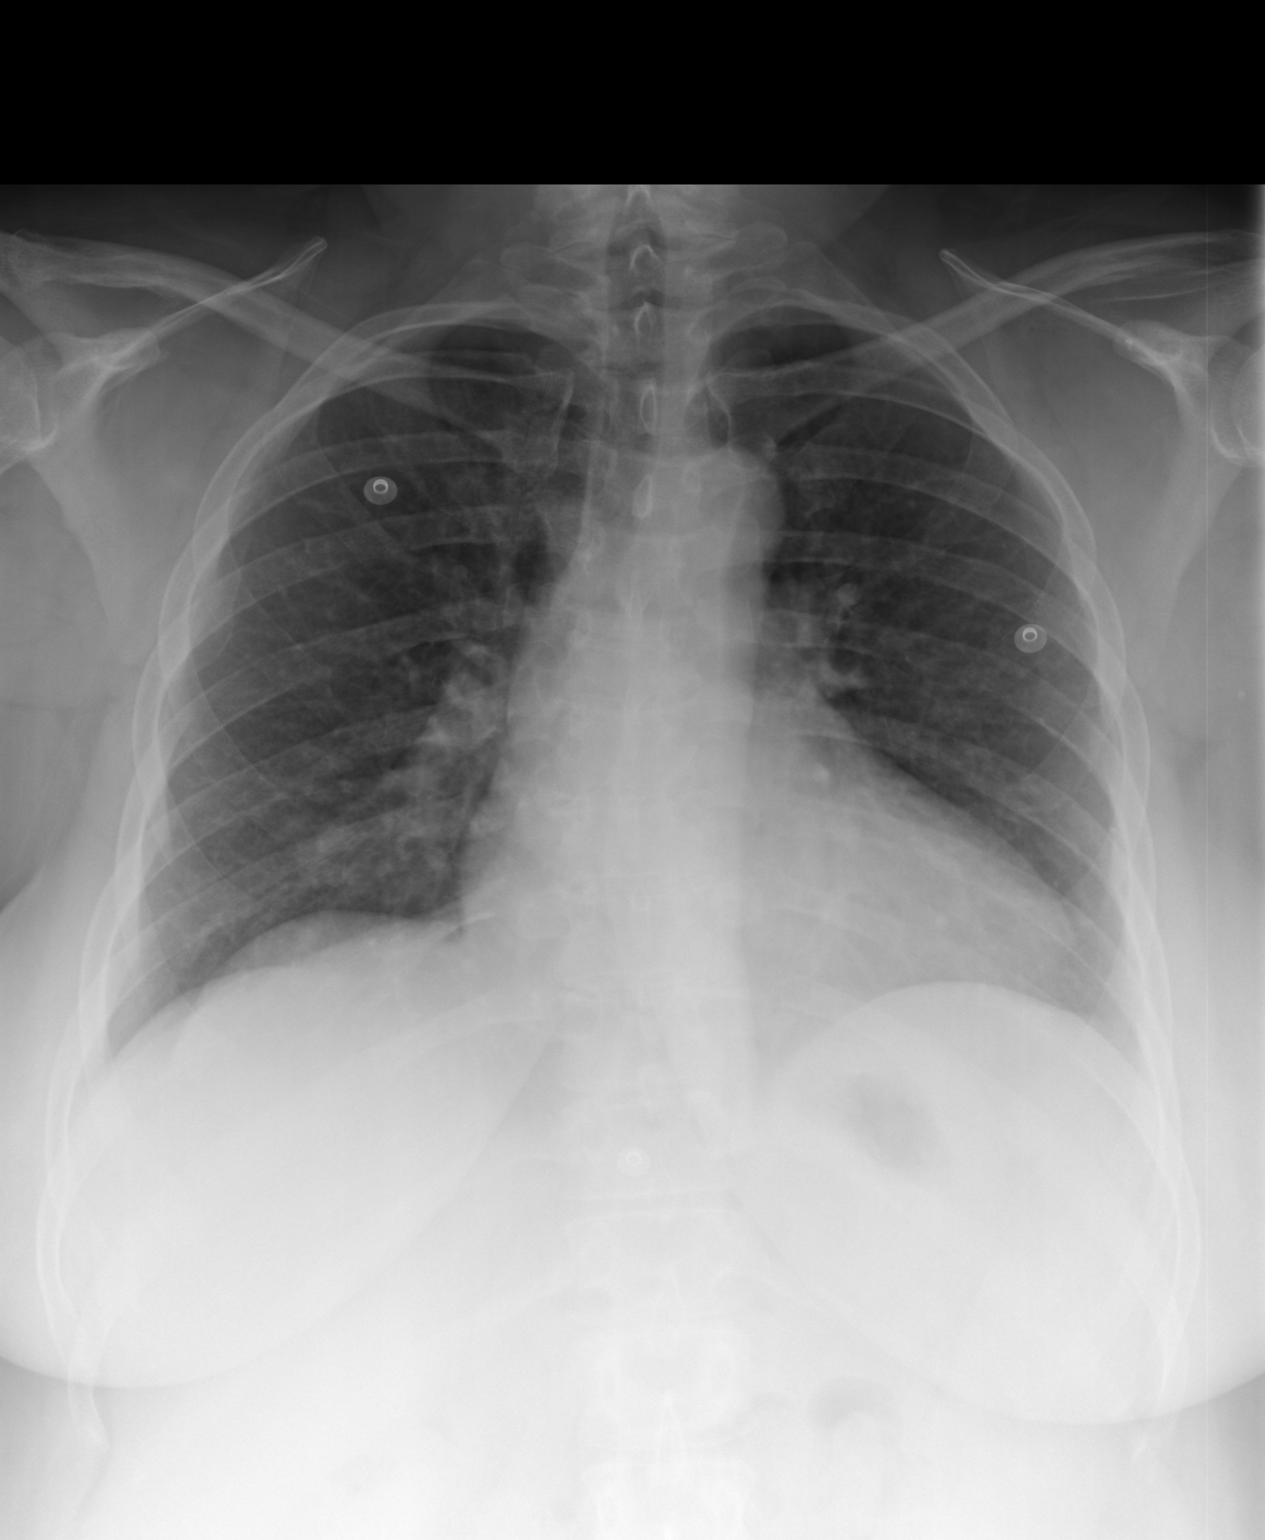
[im 2/2]
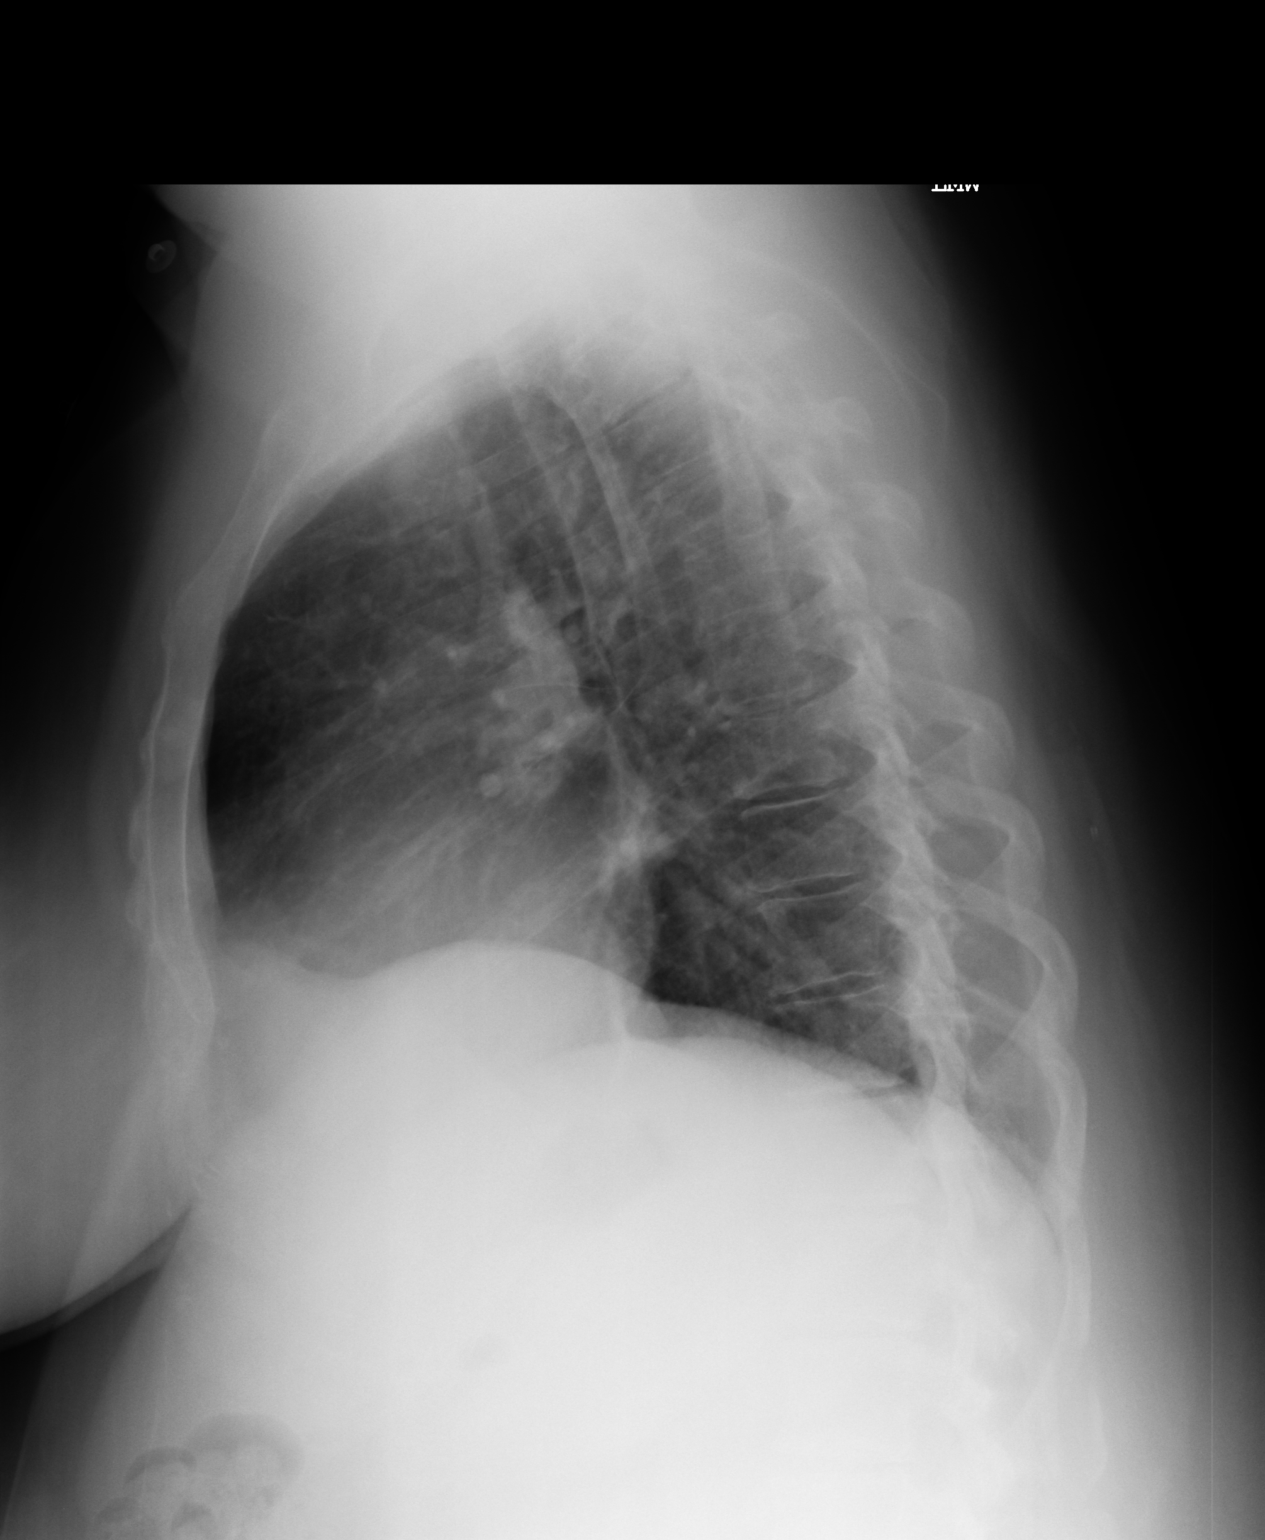

[2 of 2 positions shown; findings below may reference images not displayed]

PROCEDURE:     DXR - DXR CHEST PA (OR AP) AND LATERAL  - August 30, 2010  [DATE]

RESULT:     PA view of the chest shows the lung fields to be clear. No
pneumonia, pneumothorax or pleural effusion is seen. The heart is upper
limits for normal in size or slightly enlarged but appears stable as
compared to the prior exam.
IMPRESSION: 1.  The lung fields are clear.
2.  The heart is upper limits for normal in size or mildly enlarged but
stable in size as compared to the prior exam of 08/24/2009.

## 2012-02-20 ENCOUNTER — Emergency Department: Payer: Self-pay | Admitting: Unknown Physician Specialty

## 2012-02-20 LAB — COMPREHENSIVE METABOLIC PANEL
Albumin: 3.7 g/dL (ref 3.4–5.0)
Alkaline Phosphatase: 64 U/L (ref 50–136)
Anion Gap: 9 (ref 7–16)
BUN: 16 mg/dL (ref 7–18)
Bilirubin,Total: 0.6 mg/dL (ref 0.2–1.0)
Calcium, Total: 8.3 mg/dL — ABNORMAL LOW (ref 8.5–10.1)
Chloride: 102 mmol/L (ref 98–107)
Co2: 26 mmol/L (ref 21–32)
Creatinine: 0.66 mg/dL (ref 0.60–1.30)
EGFR (African American): >60
EGFR (Non-African Amer.): >60
Glucose: 129 mg/dL — ABNORMAL HIGH (ref 65–99)
Osmolality: 277 (ref 275–301)
Potassium: 3.6 mmol/L (ref 3.5–5.1)
SGOT(AST): 104 U/L — ABNORMAL HIGH (ref 15–37)
SGPT (ALT): 101 U/L — ABNORMAL HIGH (ref 12–78)
Sodium: 137 mmol/L (ref 136–145)
Total Protein: 8 g/dL (ref 6.4–8.2)

## 2012-02-20 LAB — CBC
HCT: 44.7 % (ref 35.0–47.0)
HGB: 15.6 g/dL (ref 12.0–16.0)
MCH: 32.9 pg (ref 26.0–34.0)
MCHC: 34.9 g/dL (ref 32.0–36.0)
MCV: 94 fL (ref 80–100)
Platelet: 163 10*3/uL (ref 150–440)
RBC: 4.75 10*6/uL (ref 3.80–5.20)
RDW: 13.4 % (ref 11.5–14.5)
WBC: 6.5 10*3/uL (ref 3.6–11.0)

## 2012-02-20 LAB — RAPID INFLUENZA A&B ANTIGENS

## 2012-02-20 LAB — TROPONIN I: Troponin-I: <0.02 ng/mL

## 2012-02-23 ENCOUNTER — Inpatient Hospital Stay: Payer: Self-pay | Admitting: Internal Medicine

## 2012-02-23 LAB — BASIC METABOLIC PANEL
Anion Gap: 8 (ref 7–16)
BUN: 12 mg/dL (ref 7–18)
Calcium, Total: 8.4 mg/dL — ABNORMAL LOW (ref 8.5–10.1)
Chloride: 101 mmol/L (ref 98–107)
Co2: 29 mmol/L (ref 21–32)
Creatinine: 0.63 mg/dL (ref 0.60–1.30)
EGFR (African American): >60
EGFR (Non-African Amer.): >60
Glucose: 115 mg/dL — ABNORMAL HIGH (ref 65–99)
Osmolality: 276 (ref 275–301)
Potassium: 3.1 mmol/L — ABNORMAL LOW (ref 3.5–5.1)
Sodium: 138 mmol/L (ref 136–145)

## 2012-02-23 LAB — CBC
HCT: 42.5 % (ref 35.0–47.0)
HGB: 14.6 g/dL (ref 12.0–16.0)
MCH: 32.5 pg (ref 26.0–34.0)
MCHC: 34.5 g/dL (ref 32.0–36.0)
MCV: 94 fL (ref 80–100)
Platelet: 154 10*3/uL (ref 150–440)
RBC: 4.51 10*6/uL (ref 3.80–5.20)
RDW: 13.1 % (ref 11.5–14.5)
WBC: 5.6 10*3/uL (ref 3.6–11.0)

## 2012-02-23 LAB — TROPONIN I: Troponin-I: <0.02 ng/mL

## 2012-02-24 LAB — CBC WITH DIFFERENTIAL/PLATELET
Basophil #: 0 10*3/uL (ref 0.0–0.1)
Basophil %: 0.3 %
Eosinophil #: 0 10*3/uL (ref 0.0–0.7)
Eosinophil %: 0 %
HCT: 39 % (ref 35.0–47.0)
HGB: 13.5 g/dL (ref 12.0–16.0)
Lymphocyte #: 0.6 10*3/uL — ABNORMAL LOW (ref 1.0–3.6)
Lymphocyte %: 24.2 %
MCH: 32.7 pg (ref 26.0–34.0)
MCHC: 34.7 g/dL (ref 32.0–36.0)
MCV: 94 fL (ref 80–100)
Monocyte #: 0.1 x10 3/mm — ABNORMAL LOW (ref 0.2–0.9)
Monocyte %: 2.9 %
Neutrophil #: 1.7 10*3/uL (ref 1.4–6.5)
Neutrophil %: 72.6 %
Platelet: 140 10*3/uL — ABNORMAL LOW (ref 150–440)
RBC: 4.15 10*6/uL (ref 3.80–5.20)
RDW: 12.9 % (ref 11.5–14.5)
WBC: 2.4 10*3/uL — ABNORMAL LOW (ref 3.6–11.0)

## 2012-02-24 LAB — URINALYSIS, COMPLETE
Bacteria: NONE SEEN
Bilirubin,UR: NEGATIVE
Blood: NEGATIVE
Glucose,UR: NEGATIVE mg/dL (ref 0–75)
Ketone: NEGATIVE
Leukocyte Esterase: NEGATIVE
Nitrite: NEGATIVE
Ph: 6 (ref 4.5–8.0)
Protein: NEGATIVE
RBC,UR: <1 /HPF (ref 0–5)
Specific Gravity: 1.013 (ref 1.003–1.030)
Squamous Epithelial: 1
WBC UR: <1 /HPF (ref 0–5)

## 2012-02-24 LAB — MAGNESIUM: Magnesium: 2.4 mg/dL

## 2012-02-25 LAB — CBC WITH DIFFERENTIAL/PLATELET
Basophil #: 0 10*3/uL (ref 0.0–0.1)
Basophil %: 0.3 %
Eosinophil #: 0 10*3/uL (ref 0.0–0.7)
Eosinophil %: 0 %
HCT: 38 % (ref 35.0–47.0)
HGB: 12.4 g/dL (ref 12.0–16.0)
Lymphocyte #: 0.9 10*3/uL — ABNORMAL LOW (ref 1.0–3.6)
Lymphocyte %: 9.8 %
MCH: 31.1 pg (ref 26.0–34.0)
MCHC: 32.6 g/dL (ref 32.0–36.0)
MCV: 95 fL (ref 80–100)
Monocyte #: 0.3 x10 3/mm (ref 0.2–0.9)
Monocyte %: 3.2 %
Neutrophil #: 7.8 10*3/uL — ABNORMAL HIGH (ref 1.4–6.5)
Neutrophil %: 86.7 %
Platelet: 173 10*3/uL (ref 150–440)
RBC: 3.98 10*6/uL (ref 3.80–5.20)
RDW: 13.4 % (ref 11.5–14.5)
WBC: 9.1 10*3/uL (ref 3.6–11.0)

## 2012-02-25 LAB — BASIC METABOLIC PANEL
Anion Gap: 10 (ref 7–16)
BUN: 11 mg/dL (ref 7–18)
Calcium, Total: 8.7 mg/dL (ref 8.5–10.1)
Chloride: 106 mmol/L (ref 98–107)
Co2: 25 mmol/L (ref 21–32)
Creatinine: 0.66 mg/dL (ref 0.60–1.30)
EGFR (African American): >60
EGFR (Non-African Amer.): >60
Glucose: 166 mg/dL — ABNORMAL HIGH (ref 65–99)
Osmolality: 284 (ref 275–301)
Potassium: 3.9 mmol/L (ref 3.5–5.1)
Sodium: 141 mmol/L (ref 136–145)

## 2012-02-26 LAB — CULTURE, BLOOD (SINGLE)

## 2012-02-29 LAB — CULTURE, BLOOD (SINGLE)

## 2012-05-23 IMAGING — CT CT ABD-PELV W/ CM
2 of 4 series · 16 of 46 positions shown, 18 images · IV contrast (Omnipaque 300)
Comparison: 

CLINICAL DATA: Abdominal and epigastric pain.

CT ABDOMEN AND PELVIS WITH CONTRAST
TECHNIQUE: Multidetector CT imaging of the abdomen and pelvis was
performed following the standard protocol during bolus
administration of intravenous contrast.
Contrast: 100mL OMNIPAQUE IOHEXOL 300 MG/ML IV SOLN

[Series 2: abd_pel_with 5.0 b40f · axial · 0.73mm/px · z∈[-556,-136]mm · 13 of 94 slices shown, 15 images]
[im 5/94  soft-tissue]
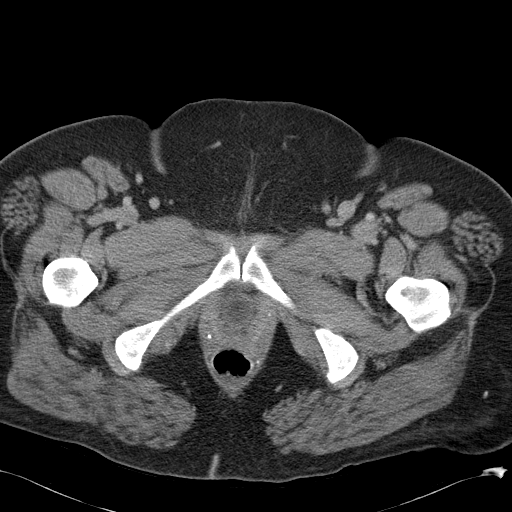
[im 5/94  bone]
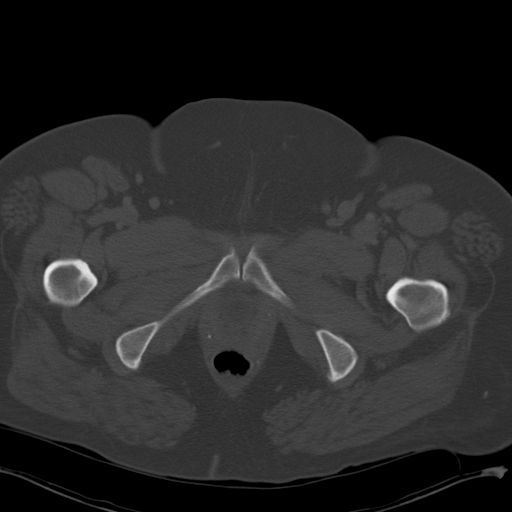
[im 15/94  soft-tissue]
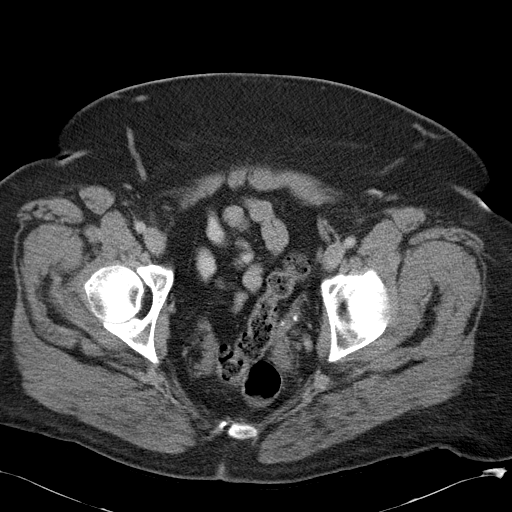
[im 20/94  soft-tissue]
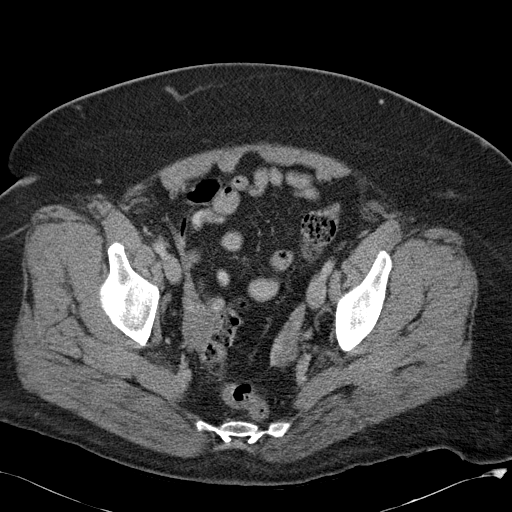
[im 25/94  soft-tissue]
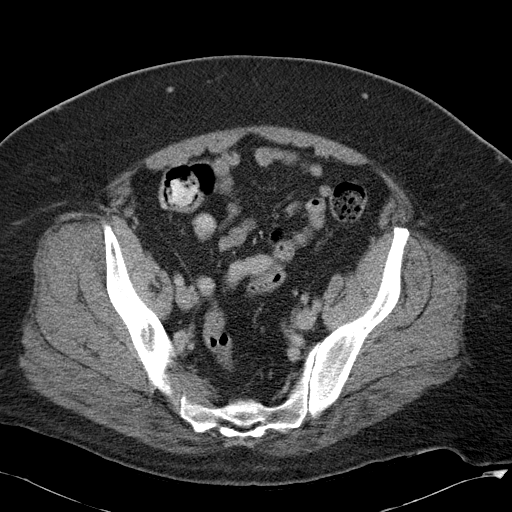
[im 35/94  soft-tissue]
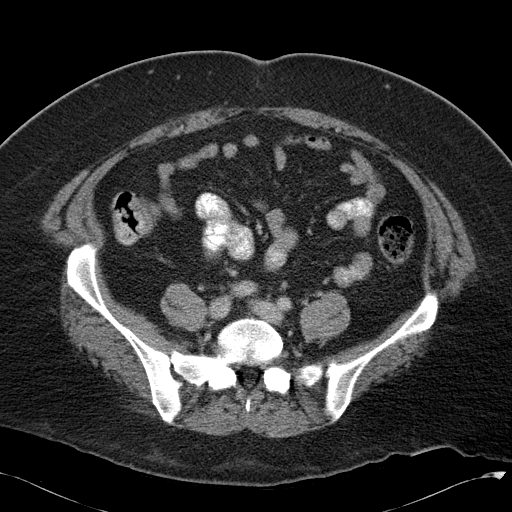
[im 40/94  soft-tissue]
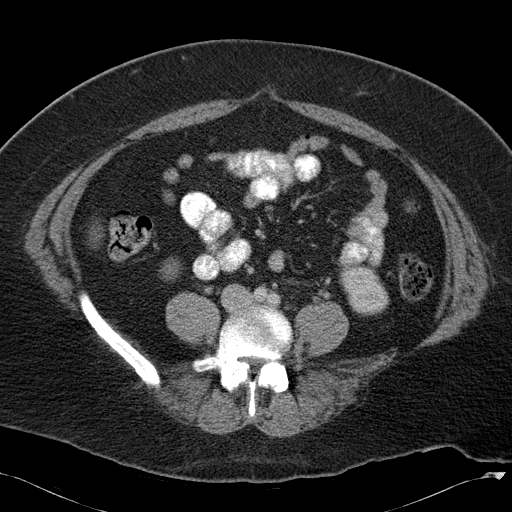
[im 49/94  soft-tissue]
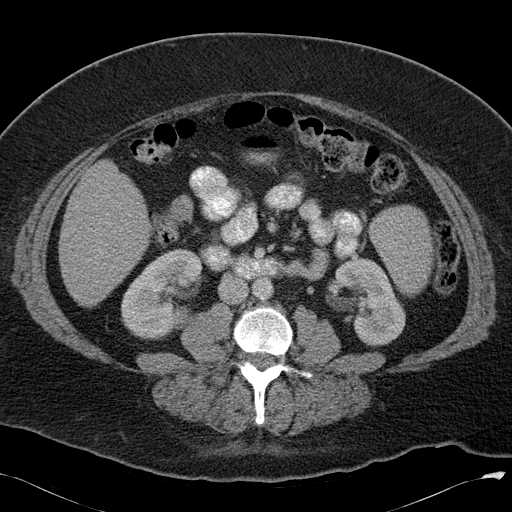
[im 54/94  soft-tissue]
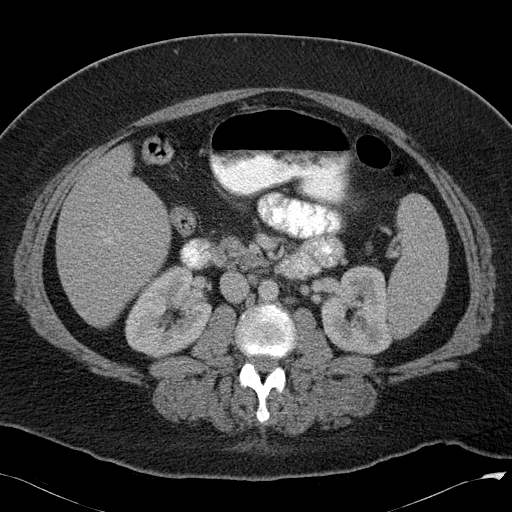
[im 59/94  soft-tissue]
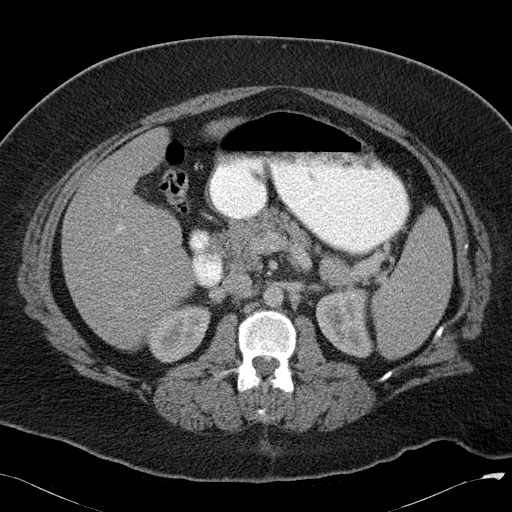
[im 59/94  bone]
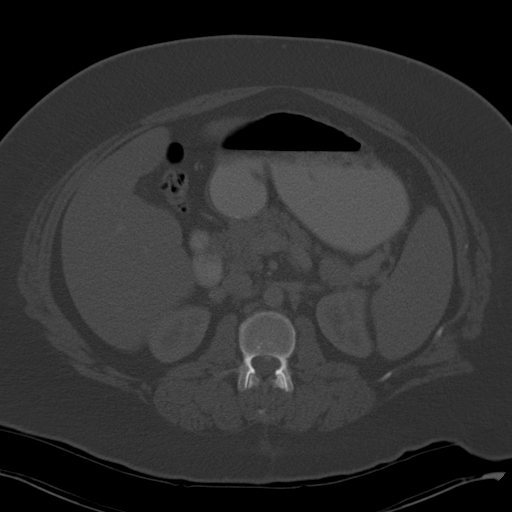
[im 69/94  soft-tissue]
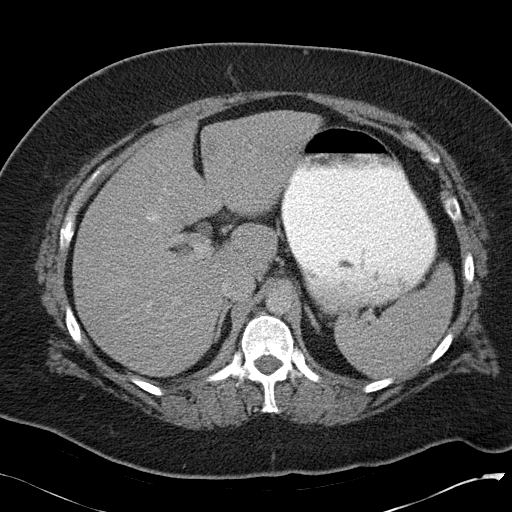
[im 74/94  soft-tissue]
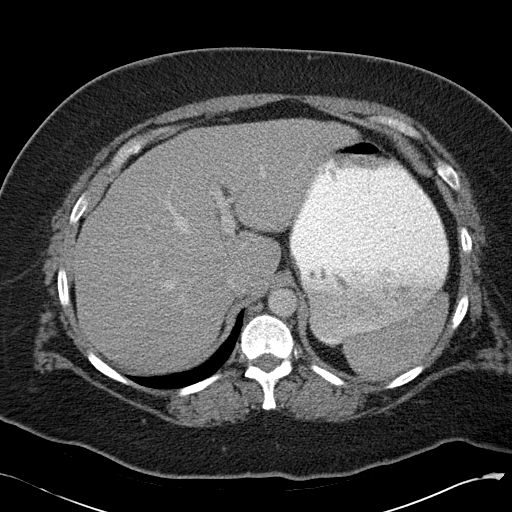
[im 79/94  soft-tissue]
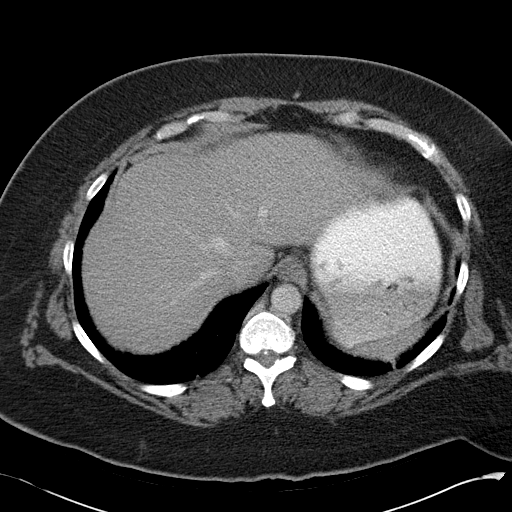
[im 89/94  soft-tissue]
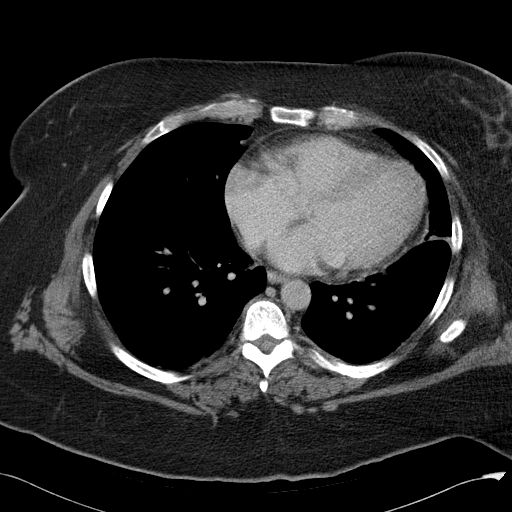

[Series 4: abd_pel_with 3.0 spo cor · coronal · 0.84mm/px · 3 of 122 slices shown]
[im 41/122  soft-tissue]
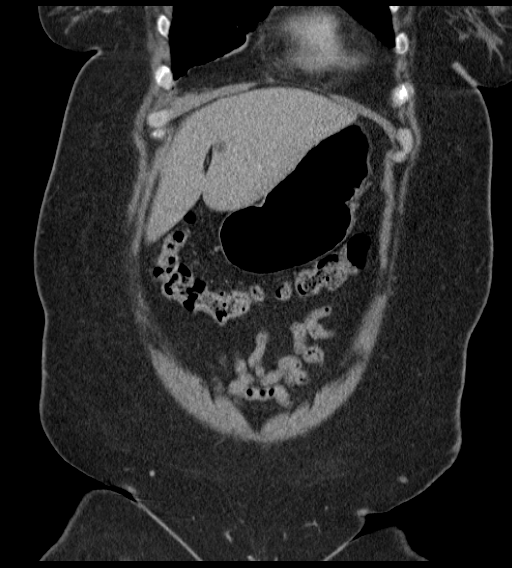
[im 54/122  soft-tissue]
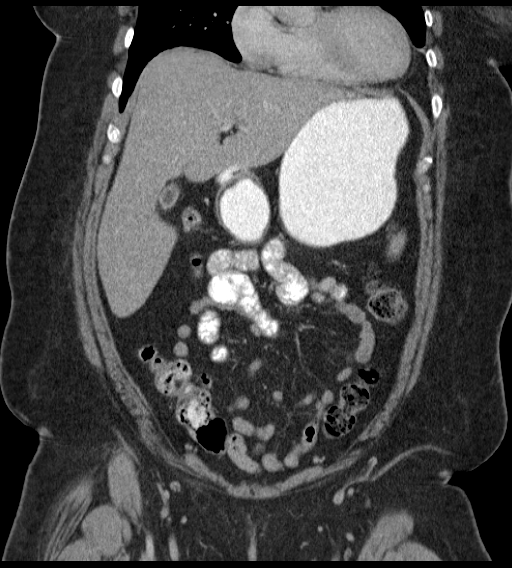
[im 68/122  soft-tissue]
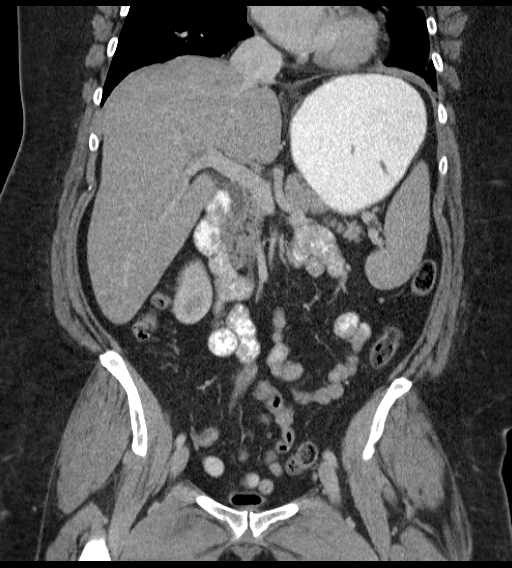

[16 of 46 positions shown; findings below may reference images not displayed]

FINDINGS: Linear opacity within the lingula and to a lesser extent
lower lobe.

Low attenuation of the liver.  Focal low attenuation within segment
two measuring 1.8 cm and fluid density.  Too small to further
characterize hypodensity within segment three.

Decompressed gallbladder. The common bile duct measures up to 6 mm.
Unremarkable spleen, pancreas, adrenal glands with the exception of
a indeterminate 1-cm nodule within the medial limb on the left.
Tiny duodenal diverticulum.

Symmetric renal enhancement.  No hydronephrosis or hydroureter.  No
urinary tract calculi identified.

Mild colonic diverticulosis without CT evidence for diverticulitis.
Appendix within normal limits.  No bowel obstruction.  No free
intraperitoneal air or fluid.  No lymphadenopathy.

There is scattered atherosclerotic calcification of the aorta and
its branches. No aneurysmal dilatation.

Foley catheter decompresses the bladder.  Air non dependently is
presumably secondary to instrumentation.  Absent uterus.  Bilateral
small adnexal/ovarian cysts.

No acute osseous abnormality.
IMPRESSION: Low attenuation of the liver suggests fatty infiltration.  No acute
process identified.

1 cm left adrenal nodule is indeterminate however most often a
benign adenoma in the absence of a known primary malignancy.  Can
be confirmed with an adrenal MRI.

## 2012-07-30 IMAGING — CR DG CHEST 2V
2 series · 2 of 2 positions shown · non-contrast
Comparison: None.

CLINICAL DATA: Cough and fever

CHEST - 2 VIEW

[view not recorded (1 of 2)]
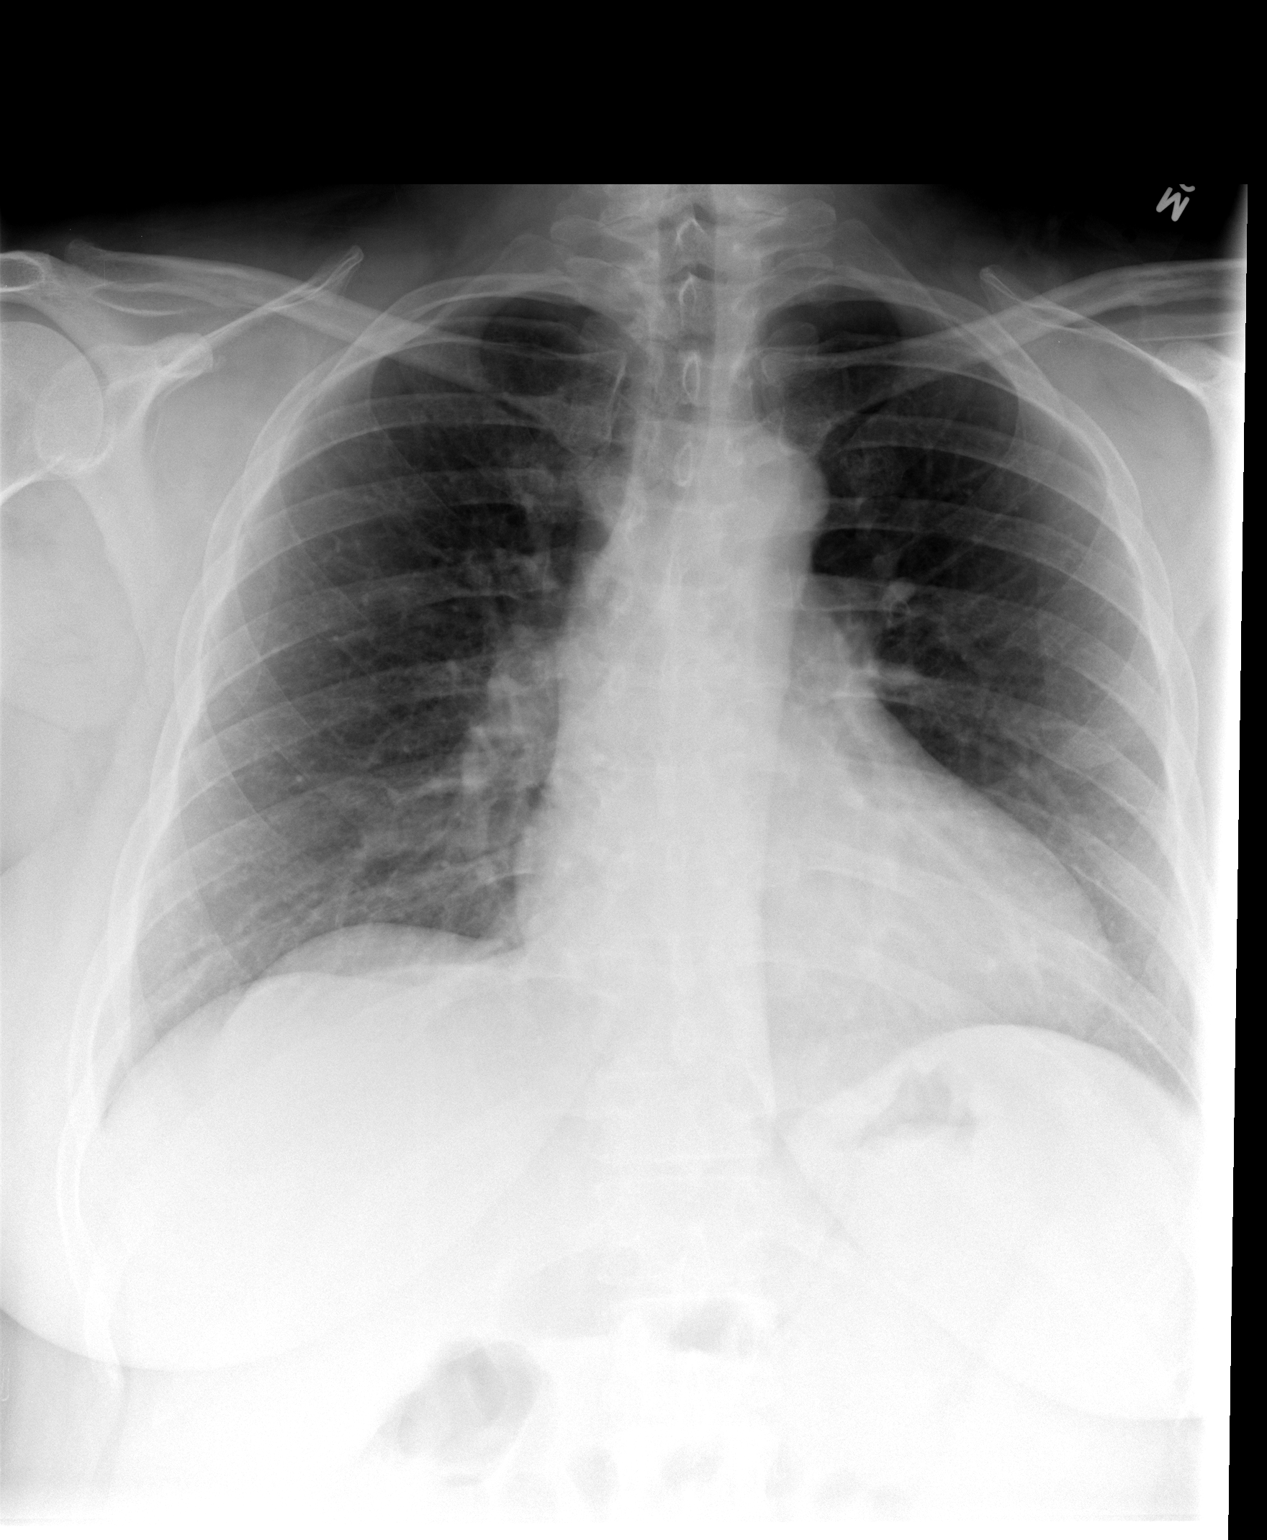

[view not recorded (2 of 2)]
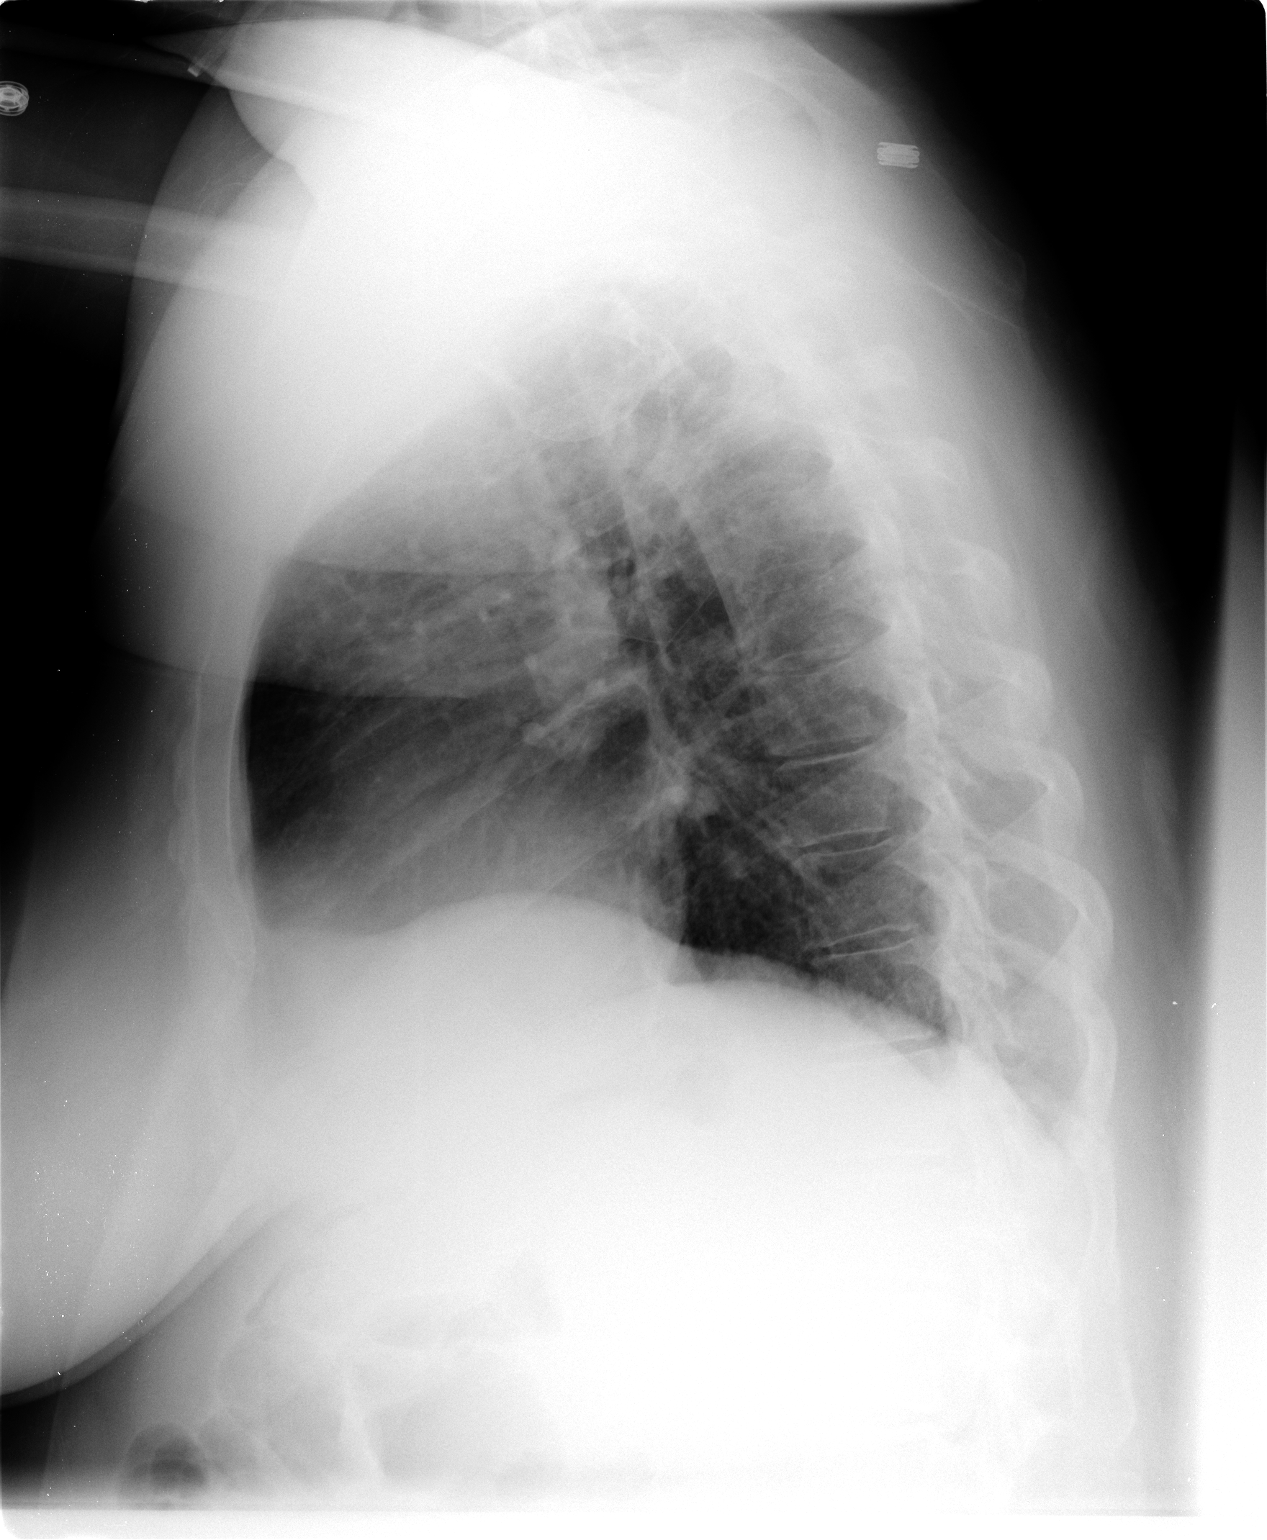

[2 of 2 positions shown; findings below may reference images not displayed]

FINDINGS: Borderline cardiomegaly.  Clear lungs.  No pneumothorax
and no pleural effusion.  Low volumes.
IMPRESSION: No active cardiopulmonary disease.

## 2012-09-25 IMAGING — CR DG CHEST 1V PORT
1 series · 1 of 1 positions shown · non-contrast
Comparison: none

REASON FOR EXAM: Chest Pain
COMMENTS:

[portable]
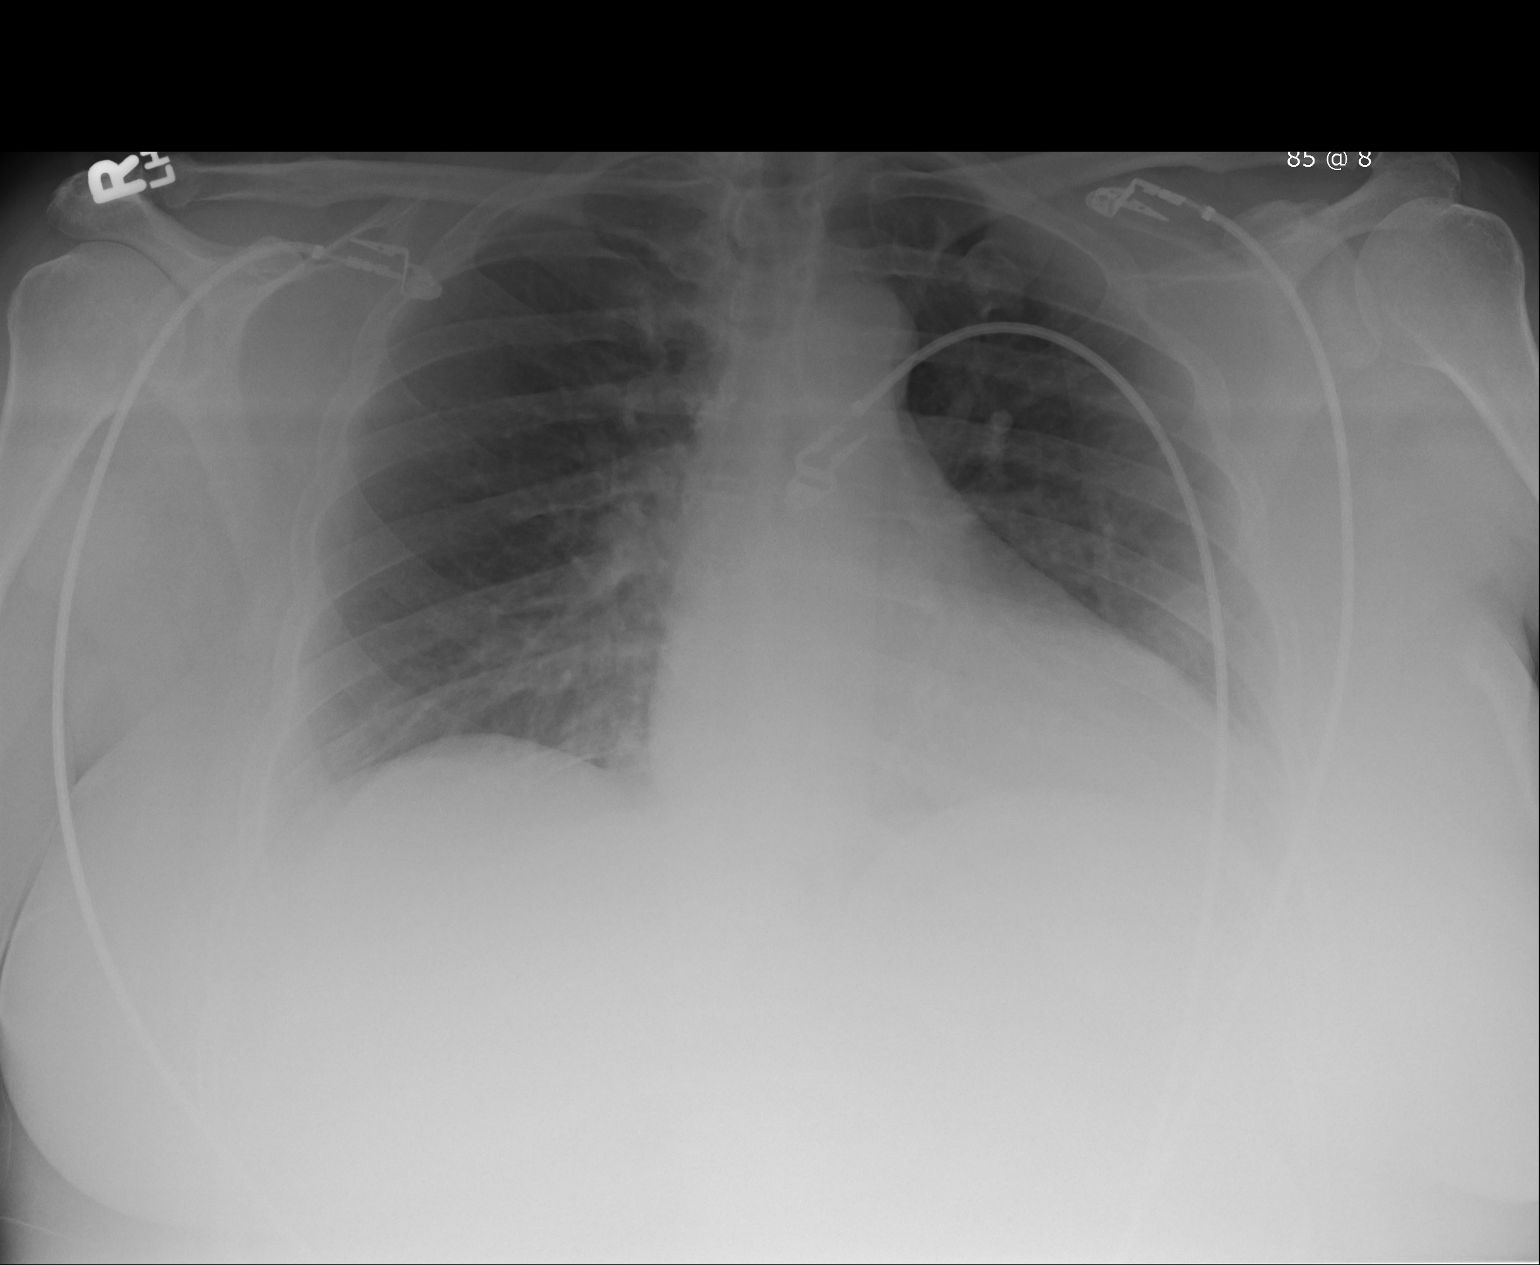

[1 of 1 positions shown; findings below may reference images not displayed]

PROCEDURE:     DXR - DXR PORTABLE CHEST SINGLE VIEW  - May 13, 2011  [DATE]

RESULT:     Comparison is made to the previous examination dated 30 August, 2010. Monitoring electrodes are present. The lungs are clear. The heart and
pulmonary vessels are normal. The bony and mediastinal structures are
unremarkable. There is no effusion. There is no pneumothorax or evidence of
congestive failure.
IMPRESSION: No acute cardiopulmonary disease. Stable appearance.

[REDACTED]

## 2012-11-04 IMAGING — CR DG CHEST 2V
1 series · 2 of 2 positions shown · non-contrast
Comparison: none

REASON FOR EXAM: cp
COMMENTS:

[Series 1: pa · 0.17mm/px · 2 of 2 slices shown]
[im 1/2]
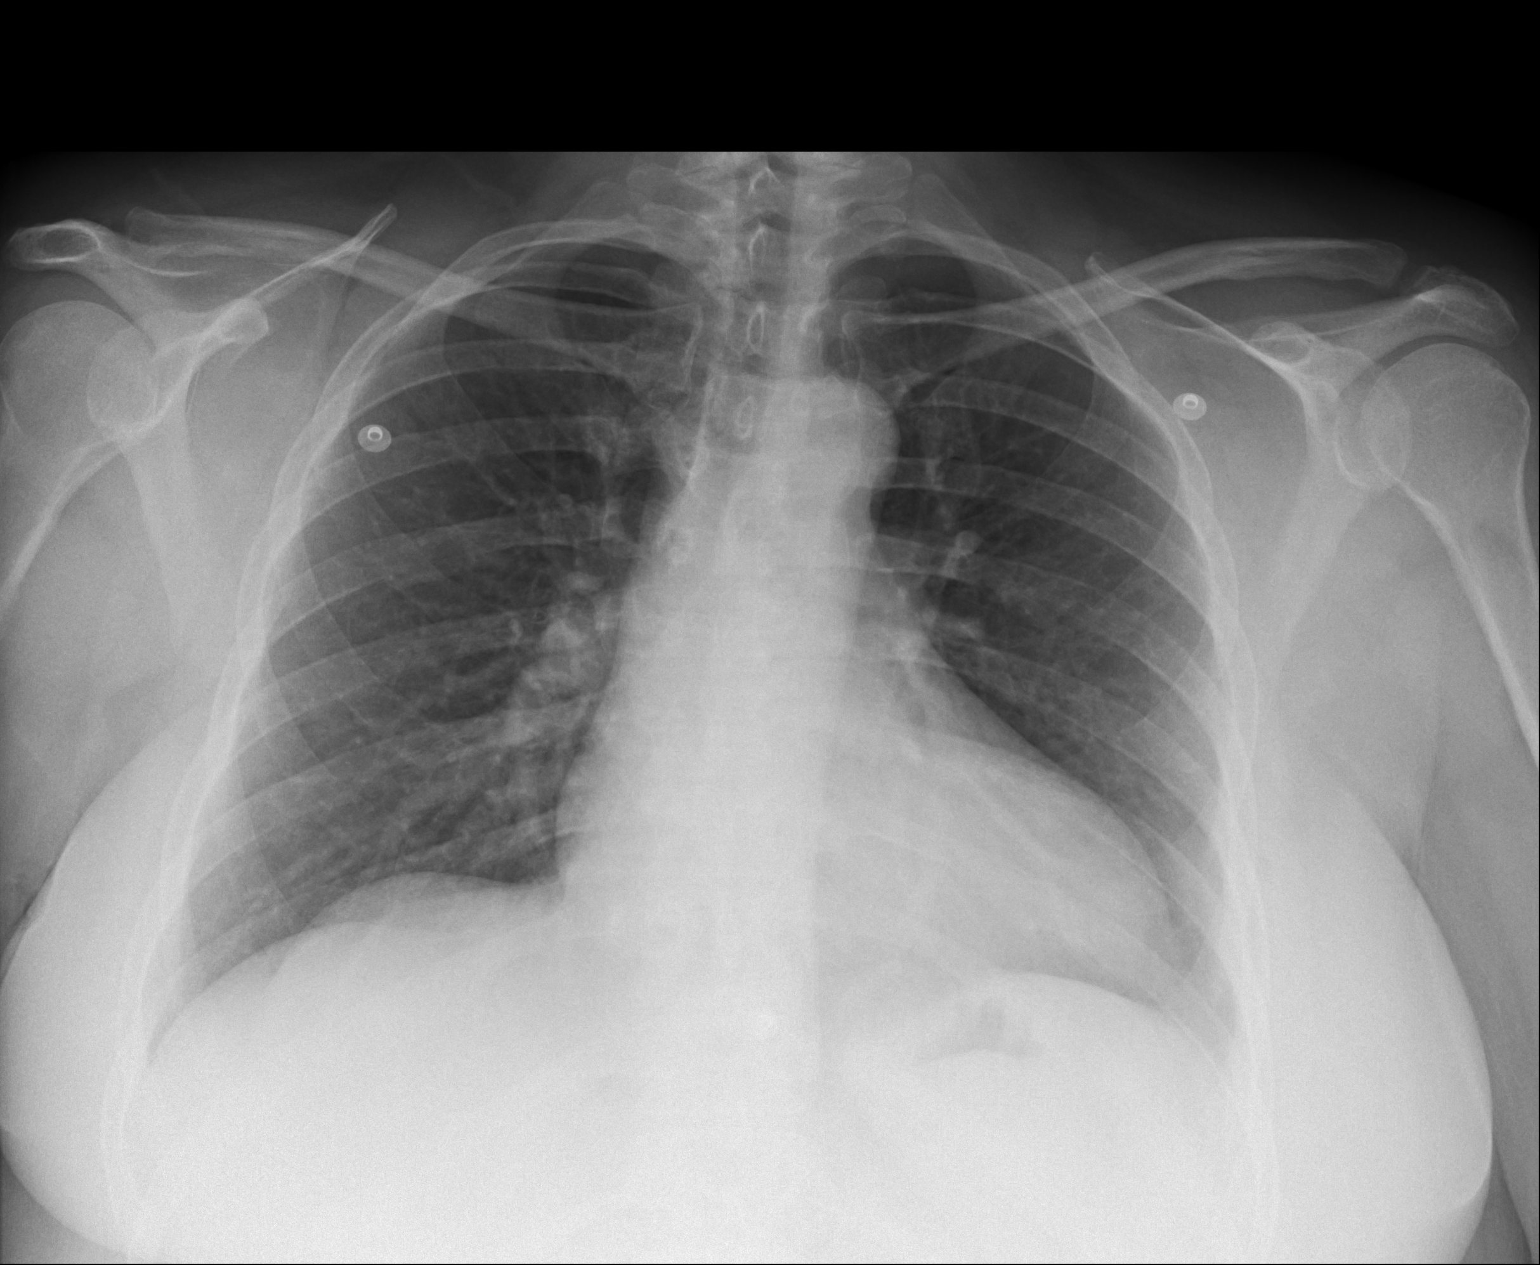
[im 2/2]
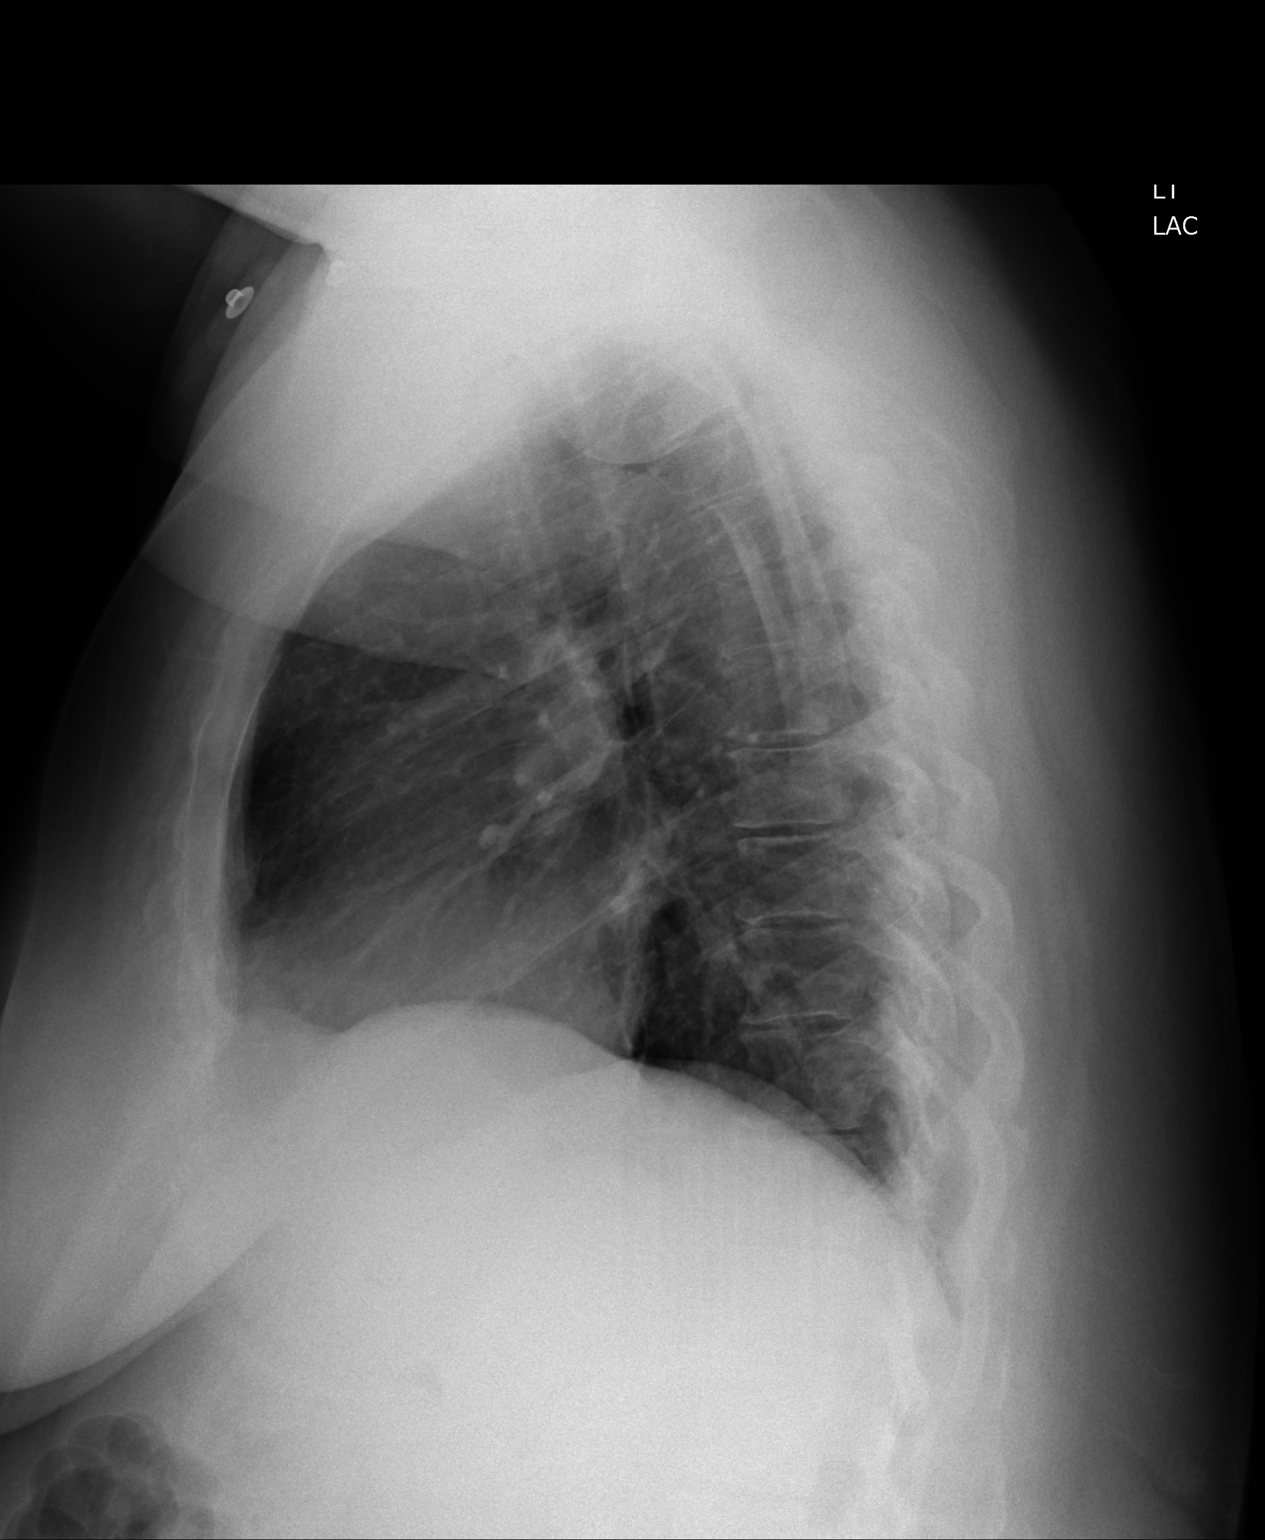

[2 of 2 positions shown; findings below may reference images not displayed]

PROCEDURE:     DXR - DXR CHEST PA (OR AP) AND LATERAL  - June 22, 2011  [DATE]

RESULT:     Comparison is made to study May 13, 2011.

The lungs are adequately inflated. There is no focal infiltrate. The cardiac
silhouette is top normal in size. The pulmonary vascularity is not engorged.
There is no pleural effusion. The mediastinum is normal in width.
IMPRESSION: I do not see definite evidence of acute cardiopulmonary
abnormality.

[REDACTED]

## 2012-11-11 ENCOUNTER — Emergency Department: Payer: Self-pay | Admitting: Emergency Medicine

## 2012-11-11 LAB — COMPREHENSIVE METABOLIC PANEL
Albumin: 3.6 g/dL (ref 3.4–5.0)
Alkaline Phosphatase: 83 U/L (ref 50–136)
Anion Gap: 4 — ABNORMAL LOW (ref 7–16)
BUN: 11 mg/dL (ref 7–18)
Bilirubin,Total: 0.4 mg/dL (ref 0.2–1.0)
Calcium, Total: 8.4 mg/dL — ABNORMAL LOW (ref 8.5–10.1)
Chloride: 103 mmol/L (ref 98–107)
Co2: 31 mmol/L (ref 21–32)
Creatinine: 0.75 mg/dL (ref 0.60–1.30)
EGFR (African American): >60
EGFR (Non-African Amer.): >60
Glucose: 105 mg/dL — ABNORMAL HIGH (ref 65–99)
Osmolality: 275 (ref 275–301)
Potassium: 3.2 mmol/L — ABNORMAL LOW (ref 3.5–5.1)
SGOT(AST): 35 U/L (ref 15–37)
SGPT (ALT): 45 U/L (ref 12–78)
Sodium: 138 mmol/L (ref 136–145)
Total Protein: 7.2 g/dL (ref 6.4–8.2)

## 2012-11-11 LAB — CBC
HCT: 39.9 % (ref 35.0–47.0)
HGB: 14.3 g/dL (ref 12.0–16.0)
MCH: 32.8 pg (ref 26.0–34.0)
MCHC: 35.9 g/dL (ref 32.0–36.0)
MCV: 91 fL (ref 80–100)
Platelet: 194 10*3/uL (ref 150–440)
RBC: 4.37 10*6/uL (ref 3.80–5.20)
RDW: 12.9 % (ref 11.5–14.5)
WBC: 6.6 10*3/uL (ref 3.6–11.0)

## 2012-11-11 LAB — URINALYSIS, COMPLETE
Bacteria: NONE SEEN
Bilirubin,UR: NEGATIVE
Blood: NEGATIVE
Glucose,UR: NEGATIVE mg/dL (ref 0–75)
Ketone: NEGATIVE
Leukocyte Esterase: NEGATIVE
Nitrite: NEGATIVE
Ph: 5 (ref 4.5–8.0)
Protein: NEGATIVE
RBC,UR: 1 /HPF (ref 0–5)
Specific Gravity: 1.023 (ref 1.003–1.030)
Squamous Epithelial: 1
WBC UR: <1 /HPF (ref 0–5)

## 2012-11-11 LAB — PRO B NATRIURETIC PEPTIDE: B-Type Natriuretic Peptide: 10 pg/mL (ref 0–125)

## 2012-11-11 LAB — TROPONIN I: Troponin-I: <0.02 ng/mL

## 2012-12-04 ENCOUNTER — Ambulatory Visit: Payer: Self-pay

## 2012-12-10 ENCOUNTER — Emergency Department: Payer: Self-pay | Admitting: Emergency Medicine

## 2012-12-10 LAB — CBC
HCT: 39.5 % (ref 35.0–47.0)
HGB: 13.4 g/dL (ref 12.0–16.0)
MCH: 32 pg (ref 26.0–34.0)
MCHC: 34 g/dL (ref 32.0–36.0)
MCV: 94 fL (ref 80–100)
Platelet: 170 10*3/uL (ref 150–440)
RBC: 4.2 10*6/uL (ref 3.80–5.20)
RDW: 13.2 % (ref 11.5–14.5)
WBC: 7.1 10*3/uL (ref 3.6–11.0)

## 2012-12-10 LAB — URINALYSIS, COMPLETE
Bacteria: NONE SEEN
Bilirubin,UR: NEGATIVE
Blood: NEGATIVE
Glucose,UR: 50 mg/dL (ref 0–75)
Ketone: NEGATIVE
Leukocyte Esterase: NEGATIVE
Nitrite: NEGATIVE
Ph: 5 (ref 4.5–8.0)
Protein: NEGATIVE
RBC,UR: <1 /HPF (ref 0–5)
Specific Gravity: 1.03 (ref 1.003–1.030)
Squamous Epithelial: 3
WBC UR: 1 /HPF (ref 0–5)

## 2012-12-10 LAB — BASIC METABOLIC PANEL
Anion Gap: 6 — ABNORMAL LOW (ref 7–16)
BUN: 9 mg/dL (ref 7–18)
Calcium, Total: 8.3 mg/dL — ABNORMAL LOW (ref 8.5–10.1)
Chloride: 106 mmol/L (ref 98–107)
Co2: 26 mmol/L (ref 21–32)
Creatinine: 0.71 mg/dL (ref 0.60–1.30)
EGFR (African American): >60
EGFR (Non-African Amer.): >60
Glucose: 227 mg/dL — ABNORMAL HIGH (ref 65–99)
Osmolality: 282 (ref 275–301)
Potassium: 3.9 mmol/L (ref 3.5–5.1)
Sodium: 138 mmol/L (ref 136–145)

## 2012-12-10 LAB — TROPONIN I: Troponin-I: <0.02 ng/mL

## 2012-12-11 LAB — TSH: Thyroid Stimulating Horm: 1.92 u[IU]/mL

## 2012-12-11 LAB — HEPATIC FUNCTION PANEL A (ARMC)
Albumin: 3.5 g/dL (ref 3.4–5.0)
Alkaline Phosphatase: 113 U/L
Bilirubin, Direct: <0.1 mg/dL (ref 0.00–0.20)
Bilirubin,Total: 0.3 mg/dL (ref 0.2–1.0)
SGOT(AST): 33 U/L (ref 15–37)
SGPT (ALT): 43 U/L (ref 12–78)
Total Protein: 6.7 g/dL (ref 6.4–8.2)

## 2012-12-11 LAB — TROPONIN I: Troponin-I: <0.02 ng/mL

## 2012-12-11 LAB — PRO B NATRIURETIC PEPTIDE: B-Type Natriuretic Peptide: 11 pg/mL (ref 0–125)

## 2012-12-21 IMAGING — CR DG CHEST 2V
1 series · 2 of 2 positions shown · non-contrast
Comparison: none

REASON FOR EXAM: chest pain
COMMENTS:

PROCEDURE:     DXR - DXR CHEST PA (OR AP) AND LATERAL  - August 08, 2011 [DATE]
RESULT:     Comparison: 06/22/2011

[Series 1: w chest pa · 0.14mm/px · 2 of 2 slices shown]
[im 1/2]
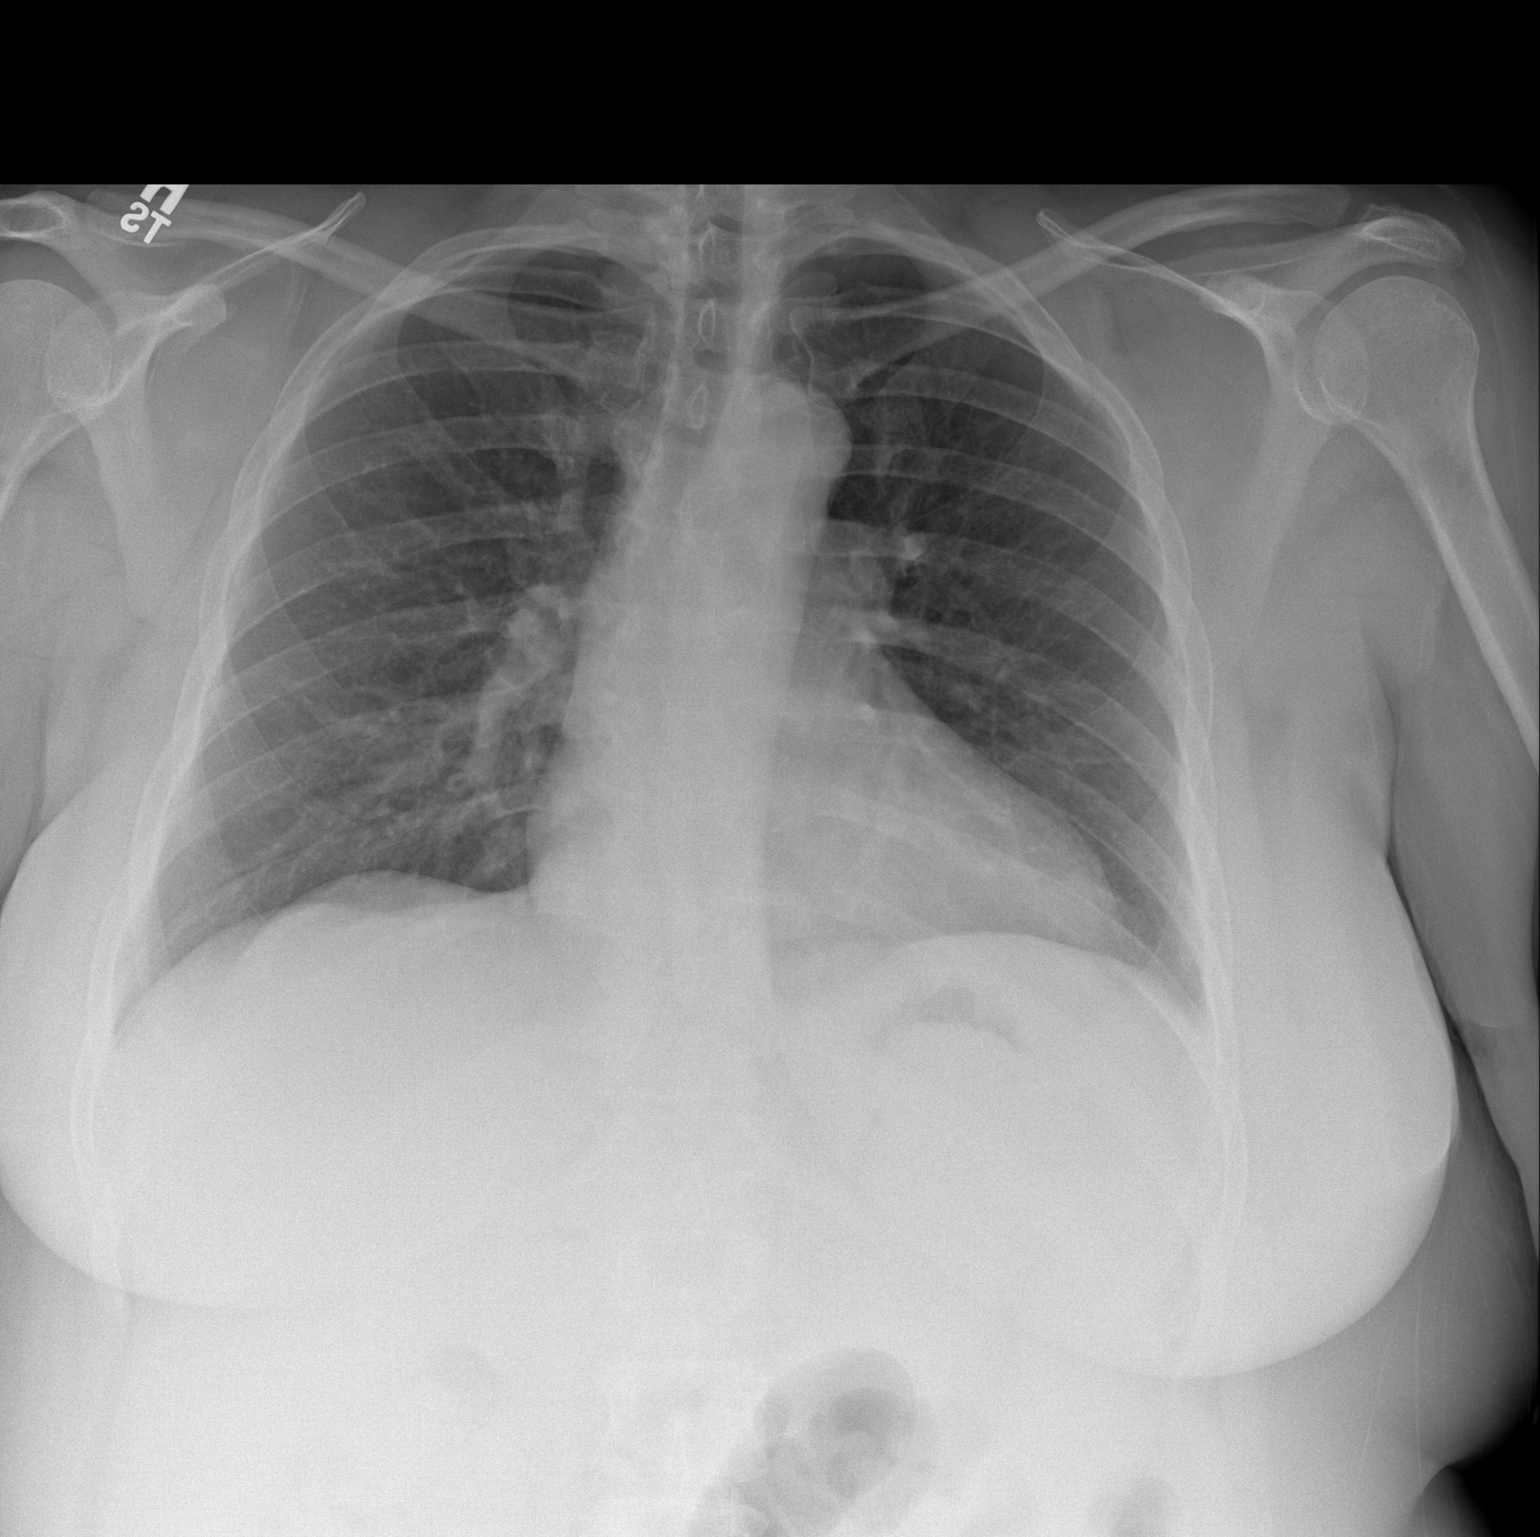
[im 2/2]
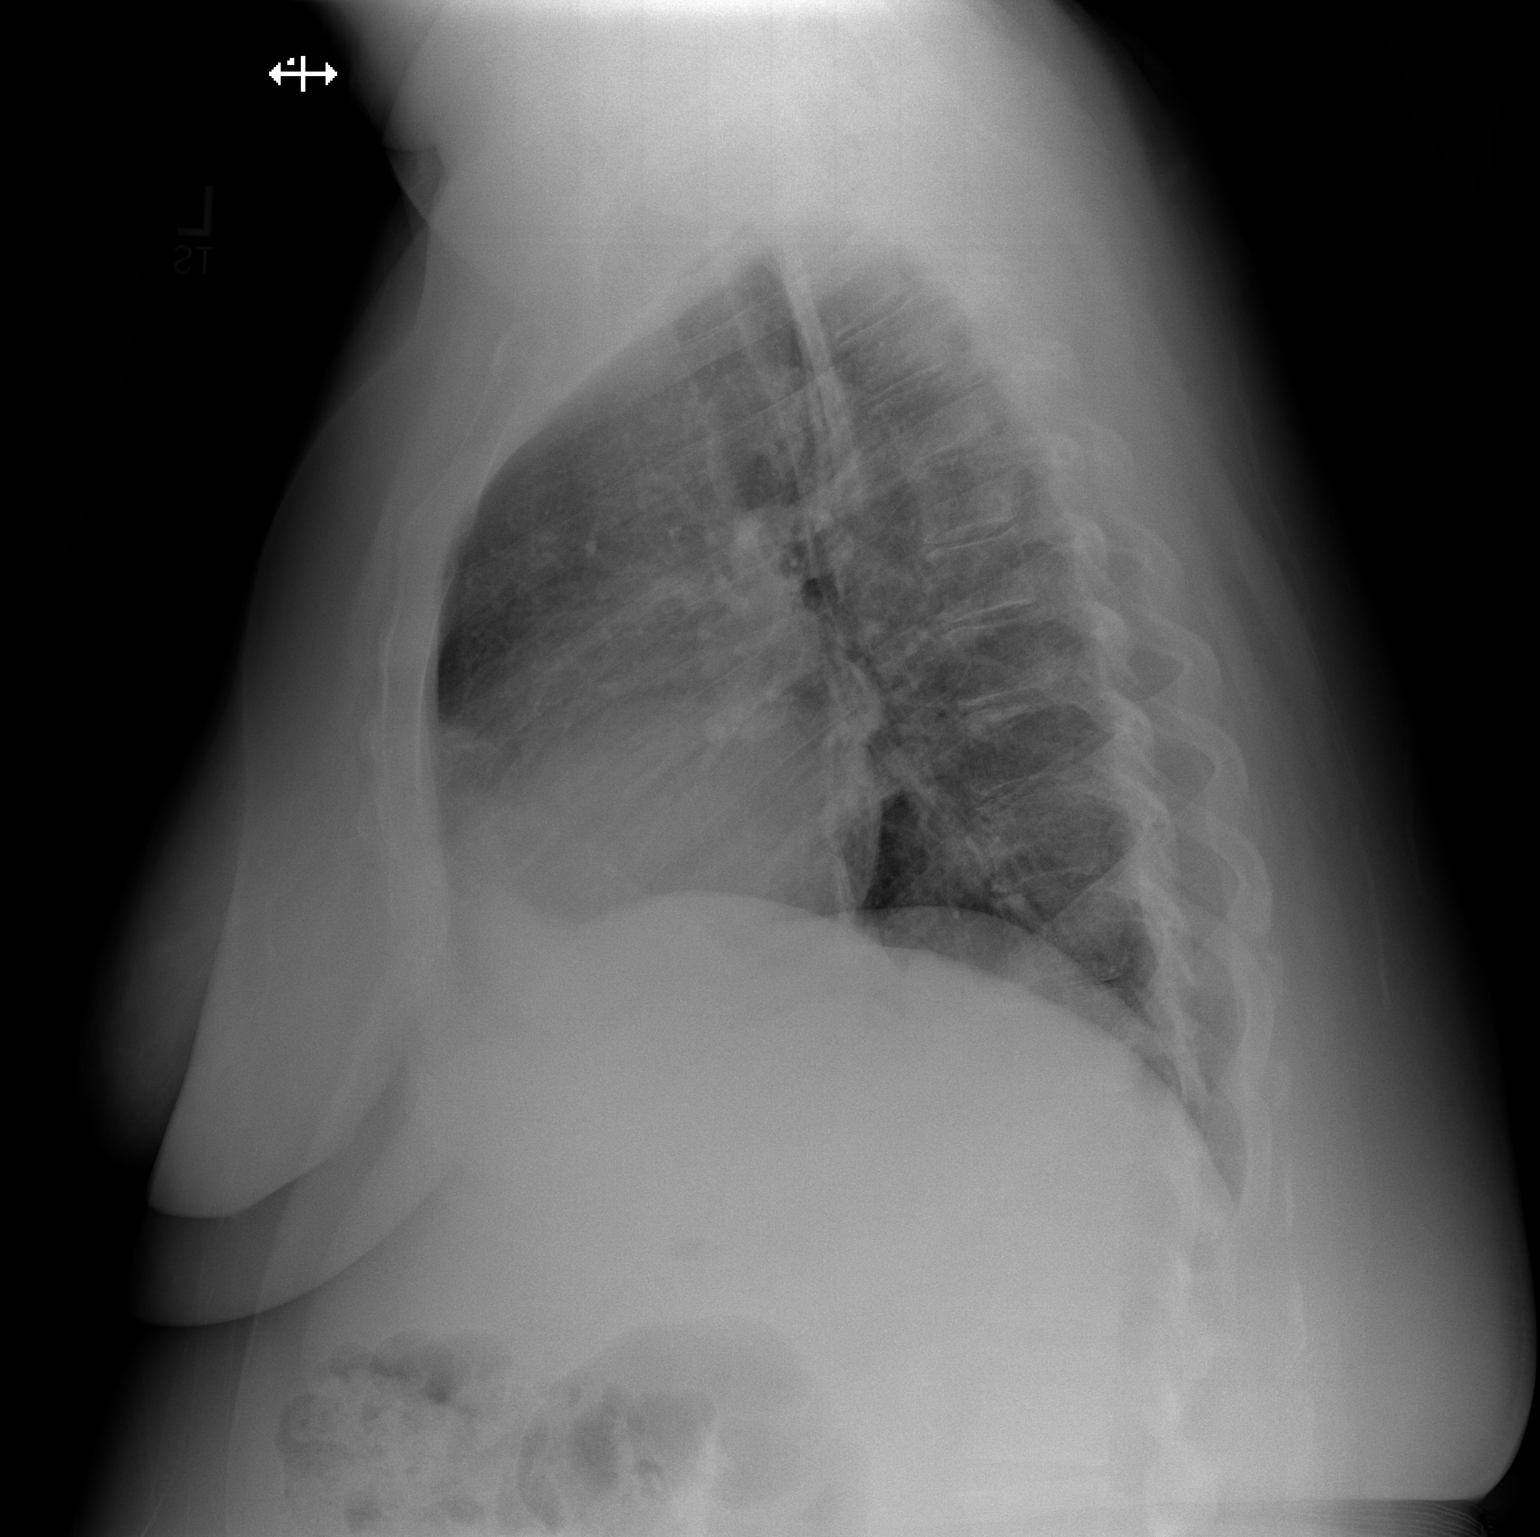

[2 of 2 positions shown; findings below may reference images not displayed]

FINDINGS: The heart and mediastinum are stable. The lung volumes are slightly
diminished. No focal pulmonary opacities.
IMPRESSION: No acute cardiopulmonary disease.

[REDACTED]

## 2013-02-22 IMAGING — CR DG ABDOMEN 1V
1 series · 2 of 2 positions shown · non-contrast
Comparison: none

REASON FOR EXAM: URINARY RETENTION
COMMENTS:   Bedside (portable):Y

PROCEDURE:     DXR - DXR KIDNEY URETER BLADDER  - October 10, 2011  [DATE]
RESULT:     Comparison: None.

[Series 1: supine kub · 0.17mm/px · 2 of 2 slices shown]
[im 1/2]
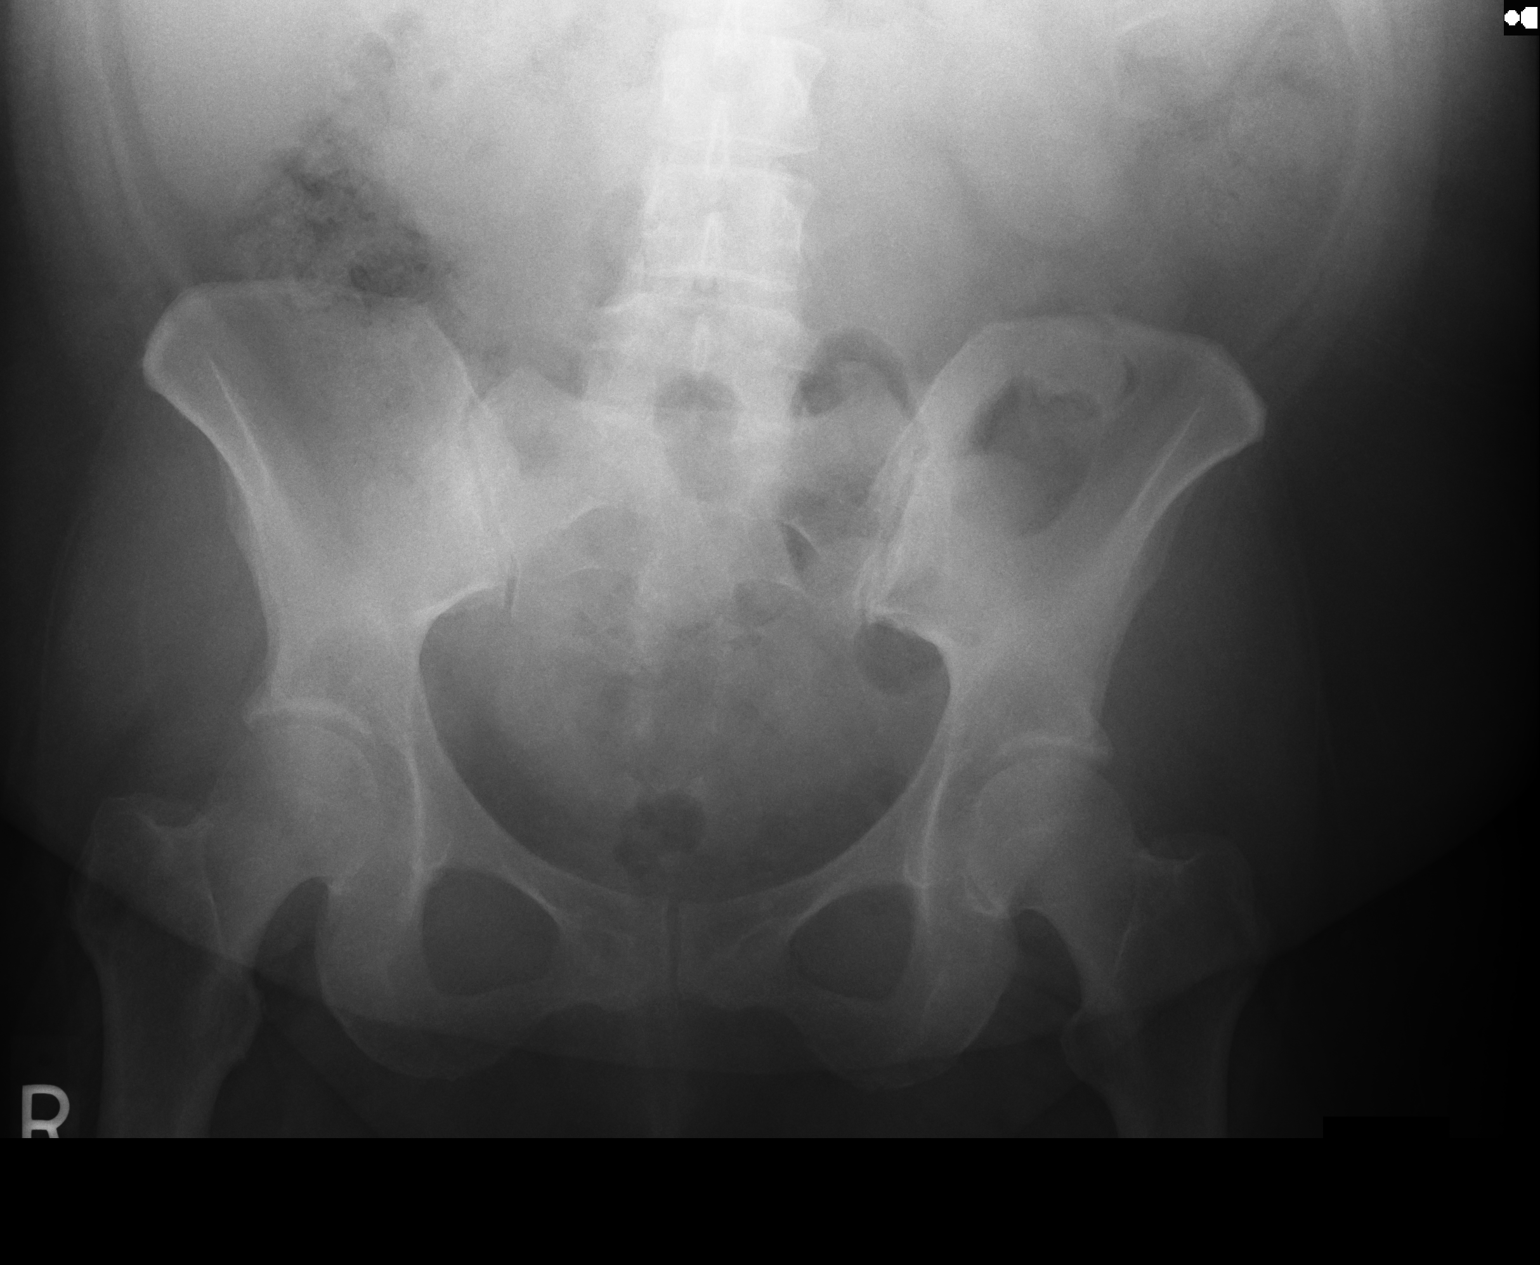
[im 2/2]
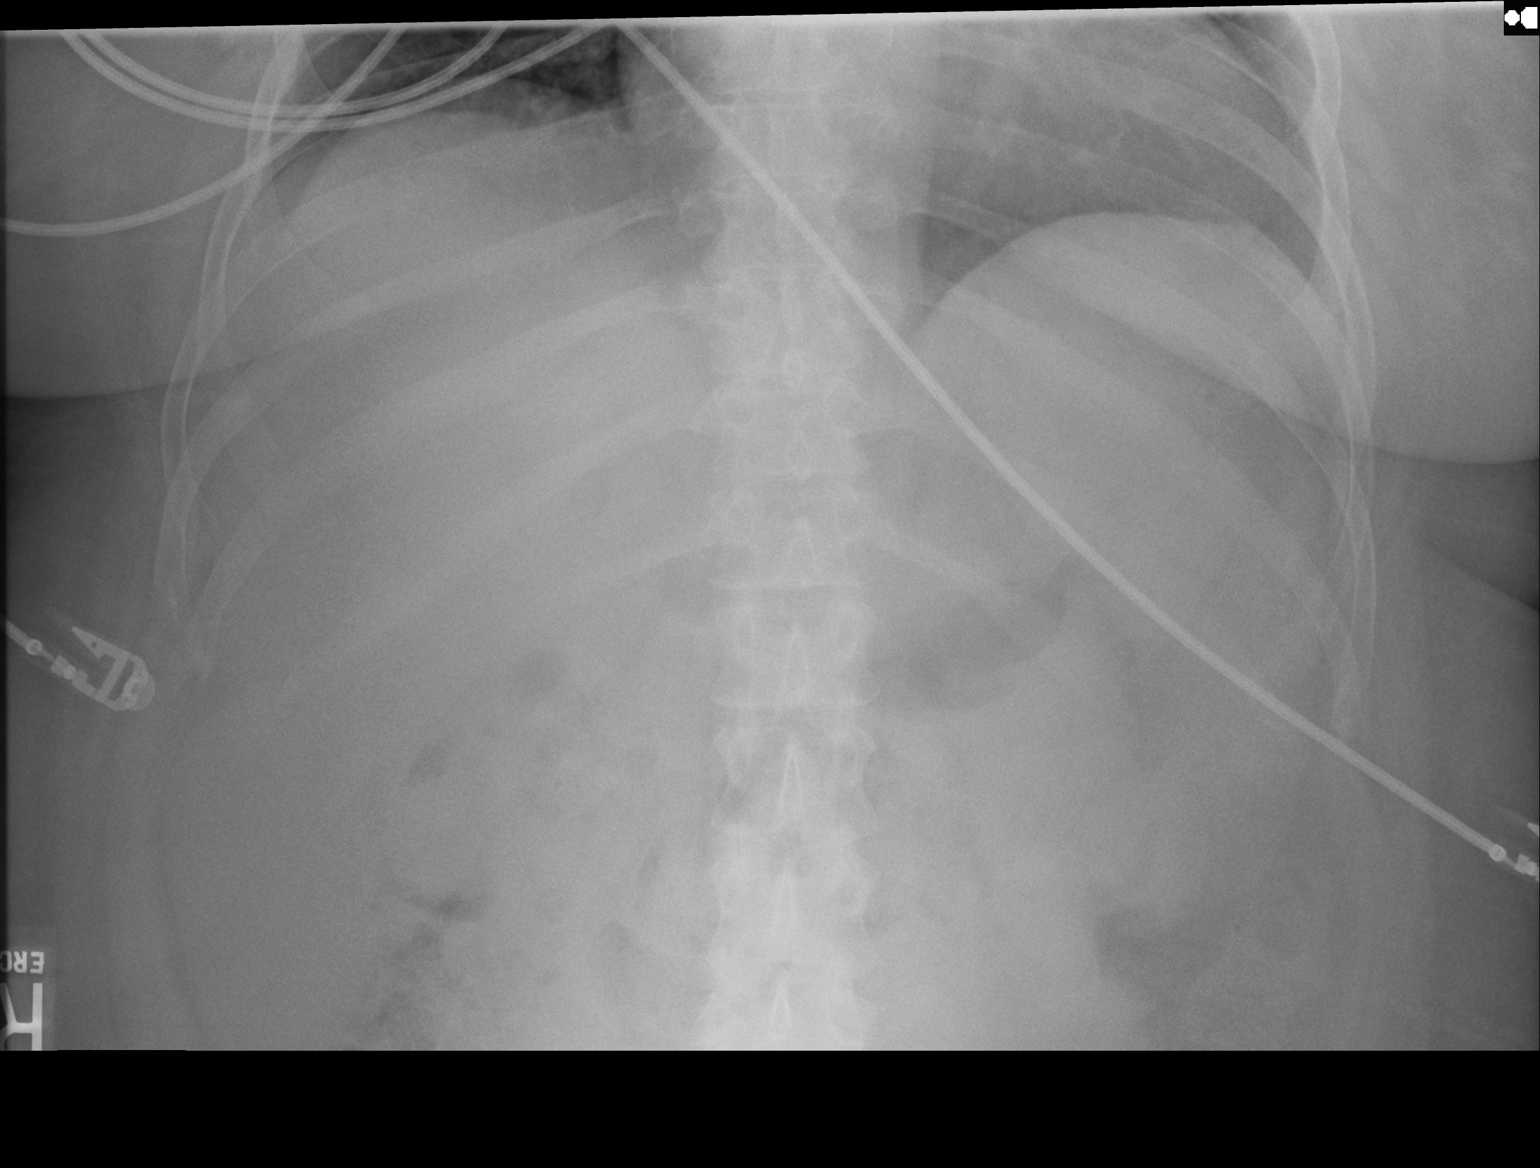

[2 of 2 positions shown; findings below may reference images not displayed]

FINDINGS: Supine technique limits evaluation for free intraperitoneal air. Air seen
within nondilated colon. There is a relative paucity of small bowel gas.
However, no definite evidence of dilated loops of small bowel. Stool is seen
in the ascending colon and descending colon.
IMPRESSION: Nonobstructed bowel gas pattern.

[REDACTED]

## 2013-02-22 IMAGING — CT CT HEAD WITHOUT CONTRAST
2 series · 16 of 30 positions shown, 20 images · non-contrast
Comparison: none

REASON FOR EXAM: AMS
COMMENTS:   May transport without cardiac monitor

PROCEDURE:     CT  - CT HEAD WITHOUT CONTRAST  - October 10, 2011  [DATE]
RESULT:     History: Altered mental status.

[Series 2: without · axial · non-contrast · 0.39mm/px · z∈[+424,+554]mm · 13 of 32 slices shown, 17 images]
[im 3/32  brain]
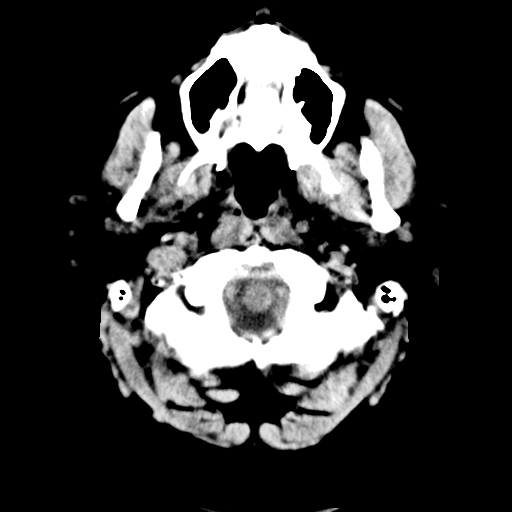
[im 3/32  bone]
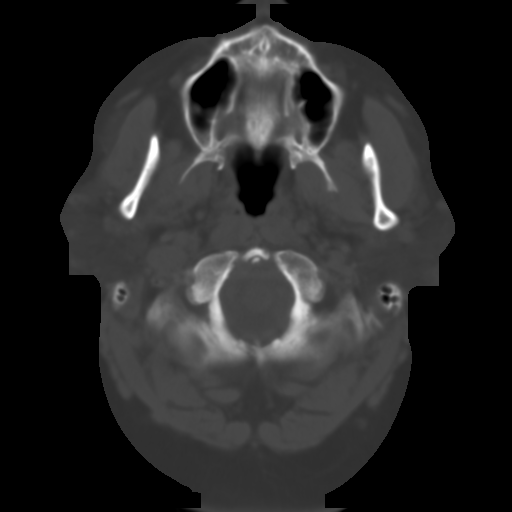
[im 5/32  brain]
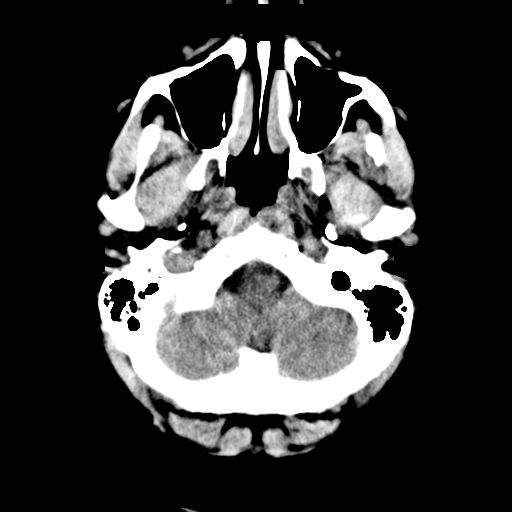
[im 7/32  brain]
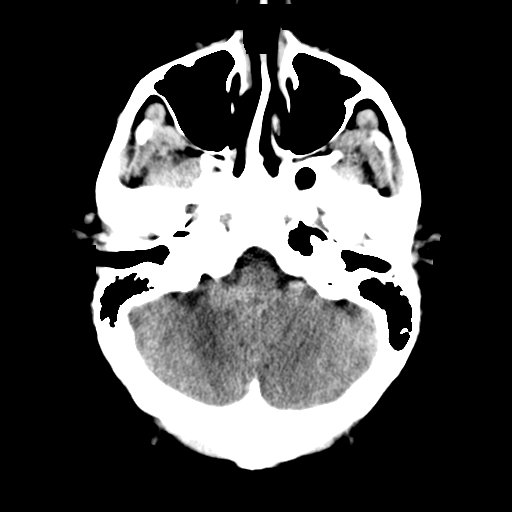
[im 9/32  brain]
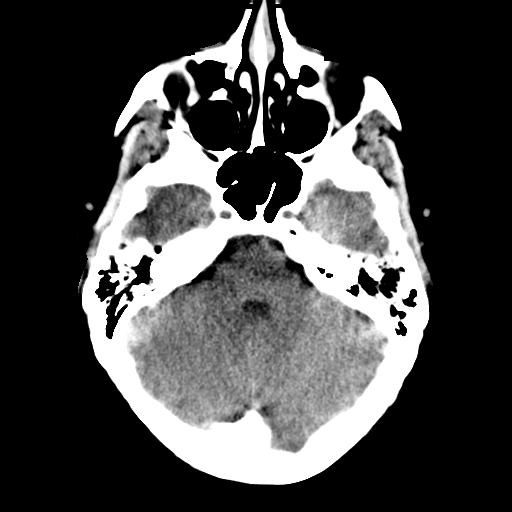
[im 12/32  brain]
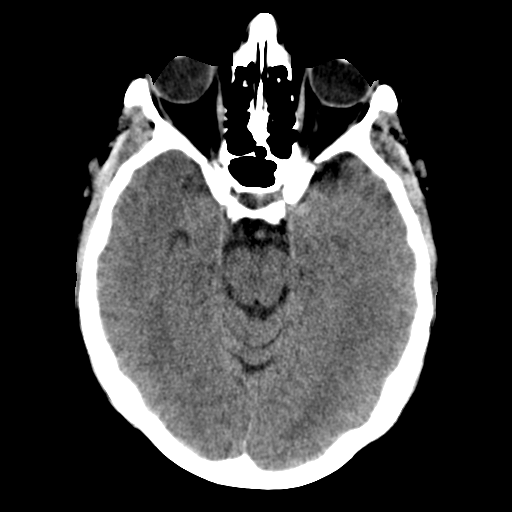
[im 12/32  bone]
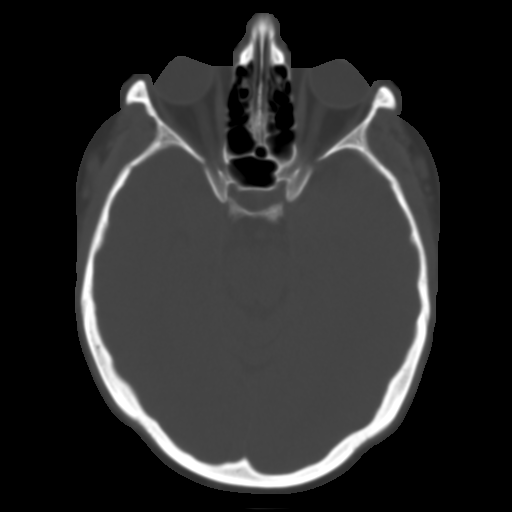
[im 14/32  brain]
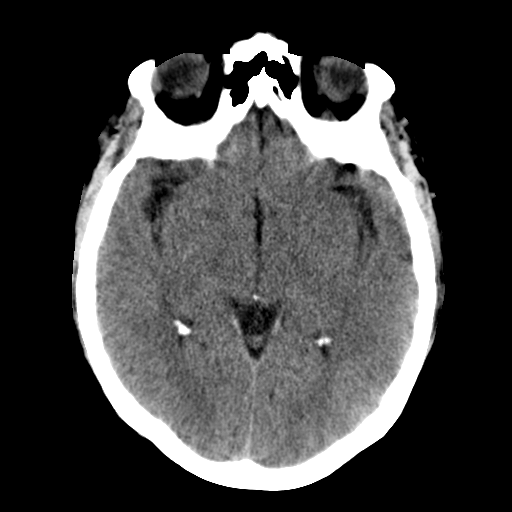
[im 16/32  brain]
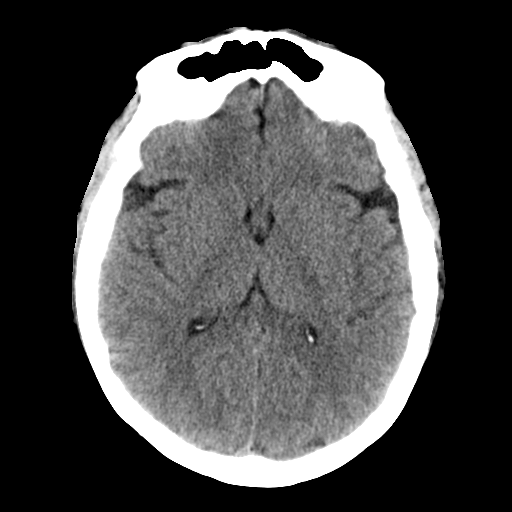
[im 18/32  brain]
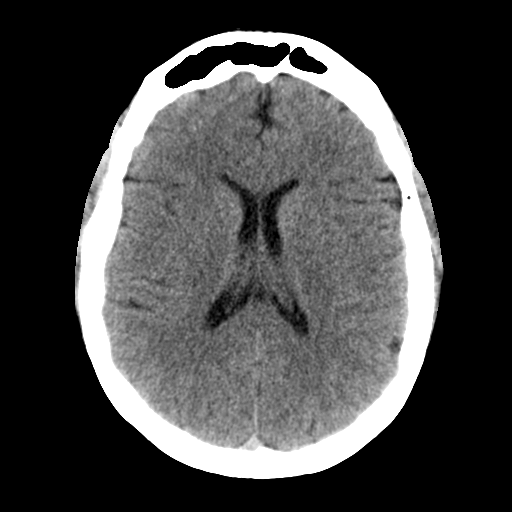
[im 20/32  brain]
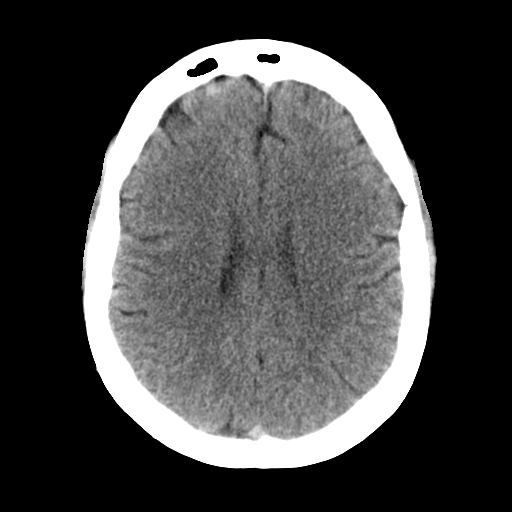
[im 20/32  bone]
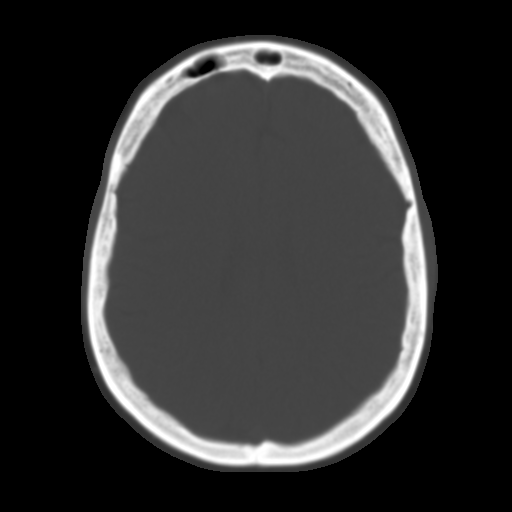
[im 23/32  brain]
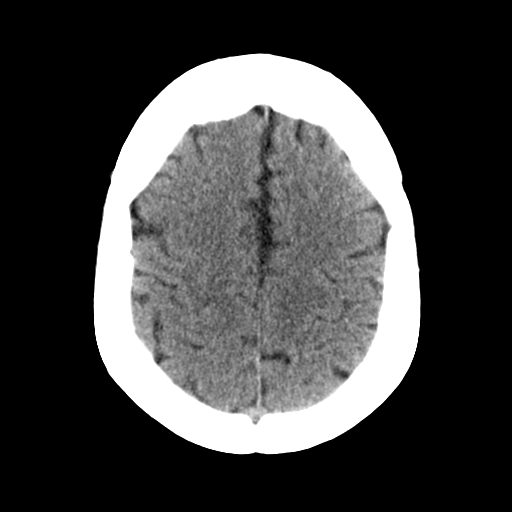
[im 25/32  brain]
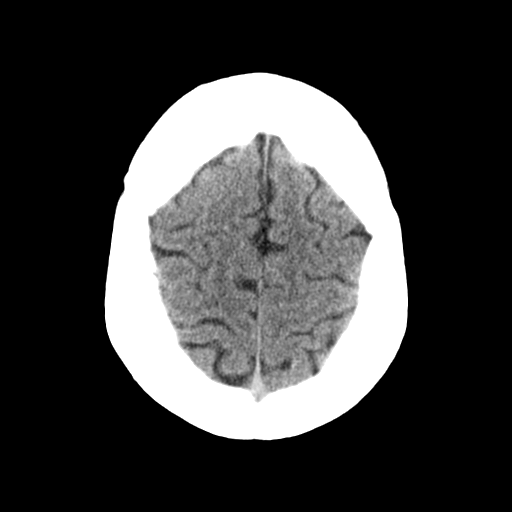
[im 27/32  brain]
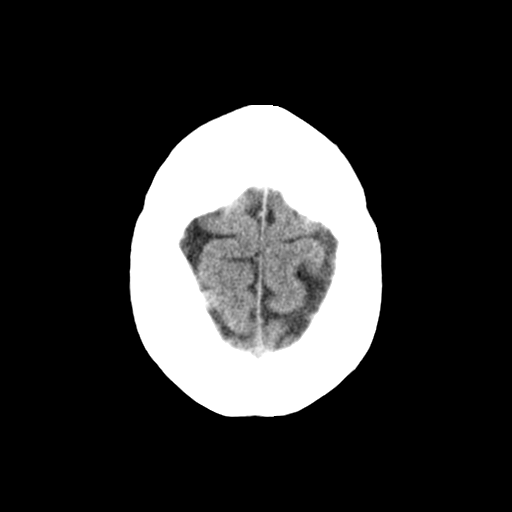
[im 29/32  brain]
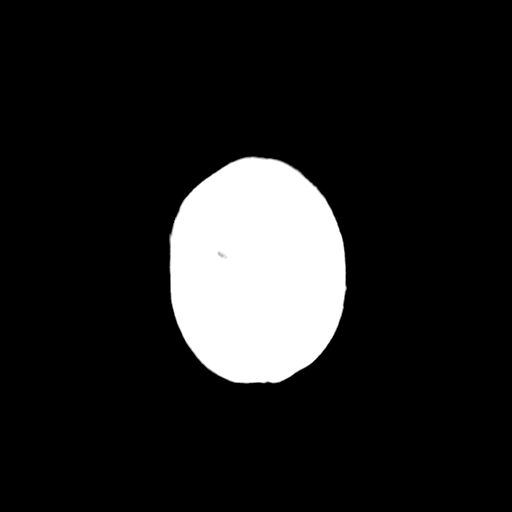
[im 29/32  bone]
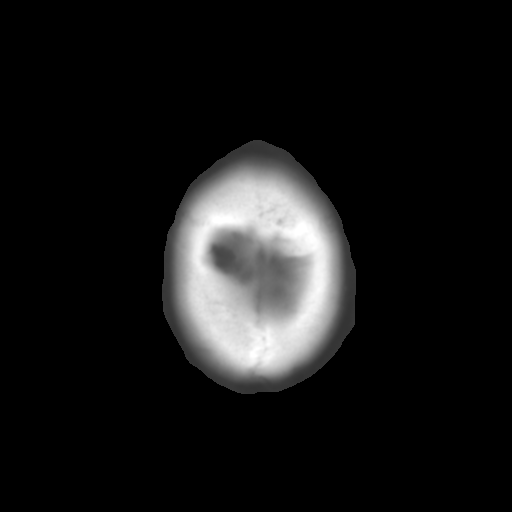

[Series 3: bone · axial · 0.39mm/px · z∈[+424,+468]mm · 3 of 32 slices shown]
[im 3/32  bone]
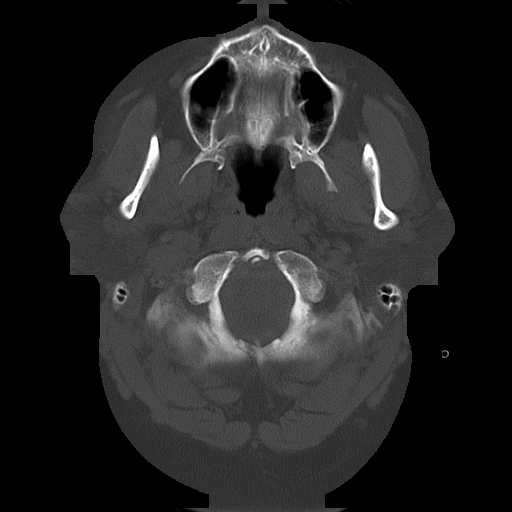
[im 7/32  bone]
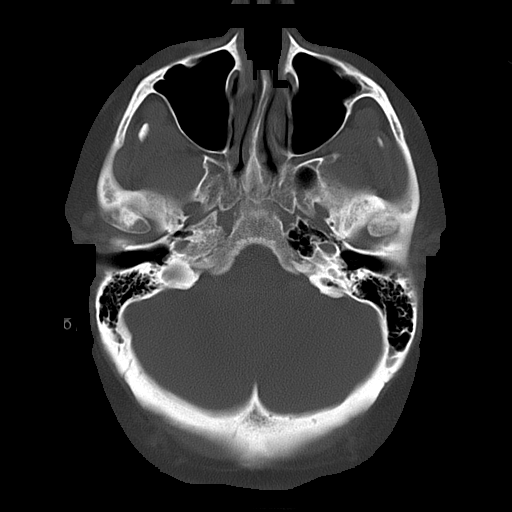
[im 12/32  bone]
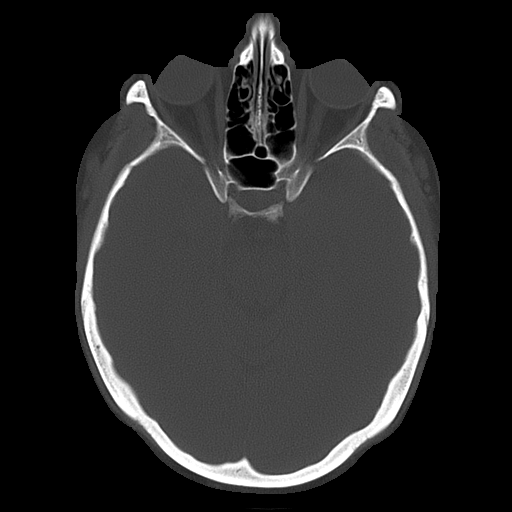

[16 of 30 positions shown; findings below may reference images not displayed]

FINDINGS: Standard nonenhanced CT obtained. No mass. No hydrocephalus. No
hemorrhage .No acute bony abnormality.
IMPRESSION: No acute abnormality.

## 2013-03-02 IMAGING — CT CT STONE STUDY
1 of 2 series · 15 of 32 positions shown, 19 images · non-contrast
Comparison: none

REASON FOR EXAM: lower abdominal pain and back pain
COMMENTS:

[Series 2: 3mm soft tissue · axial · 0.86mm/px · z∈[-1123,-685]mm · 15 of 160 slices shown, 19 images]
[im 7/160  soft-tissue]
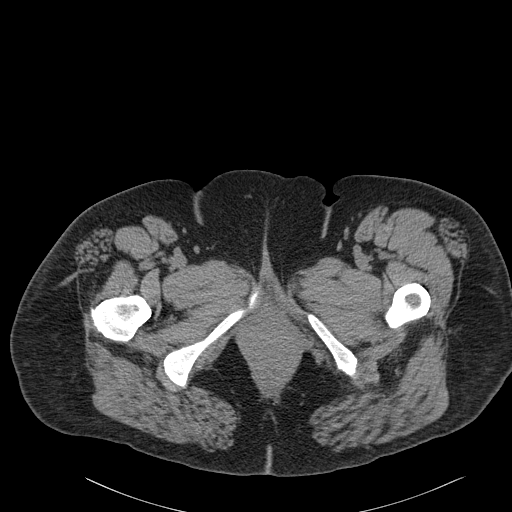
[im 7/160  bone]
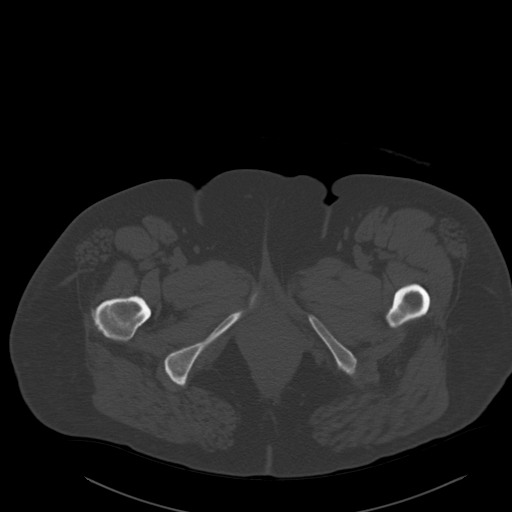
[im 20/160  soft-tissue]
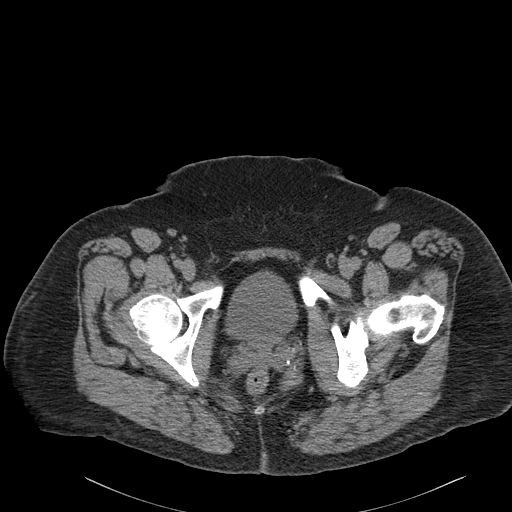
[im 34/160  soft-tissue]
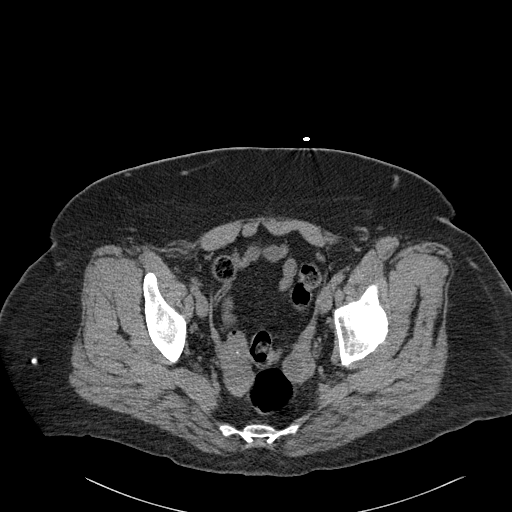
[im 47/160  soft-tissue]
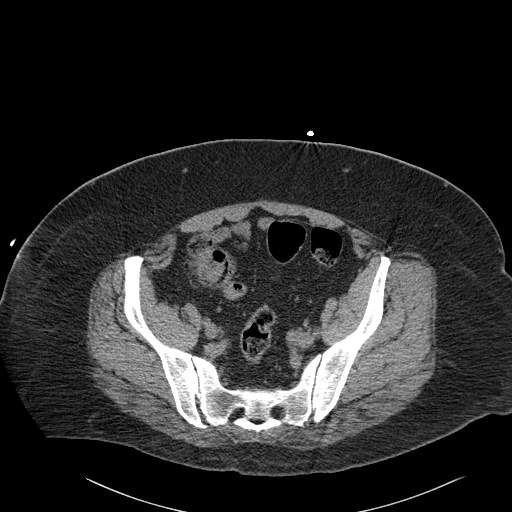
[im 54/160  soft-tissue]
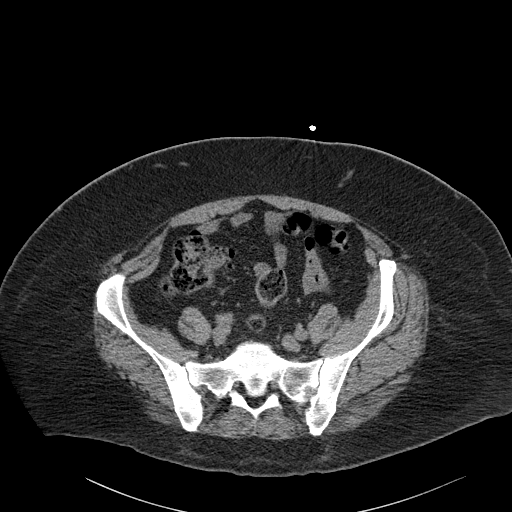
[im 67/160  soft-tissue]
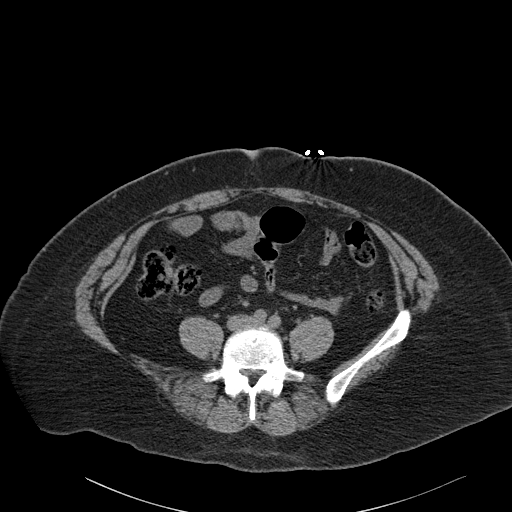
[im 80/160  soft-tissue]
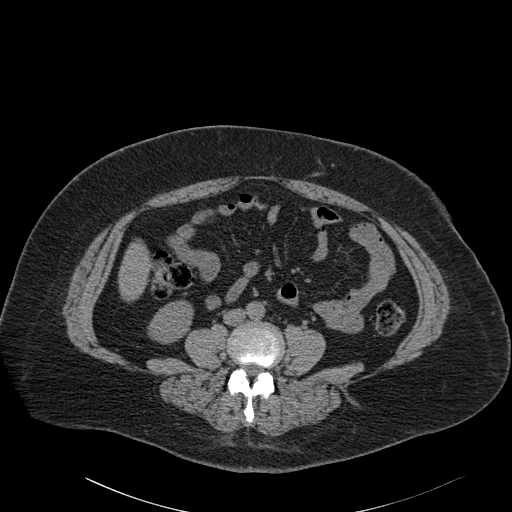
[im 93/160  soft-tissue]
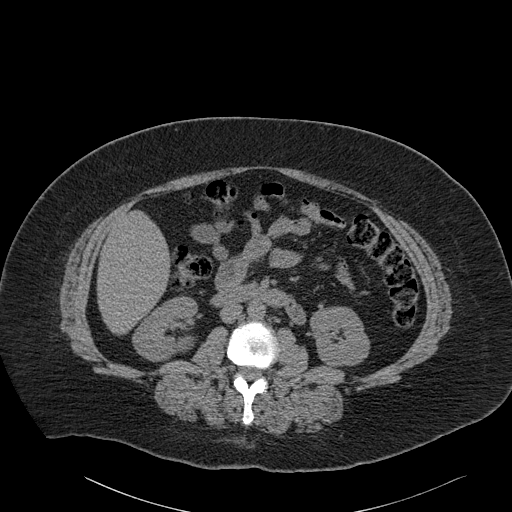
[im 107/160  soft-tissue]
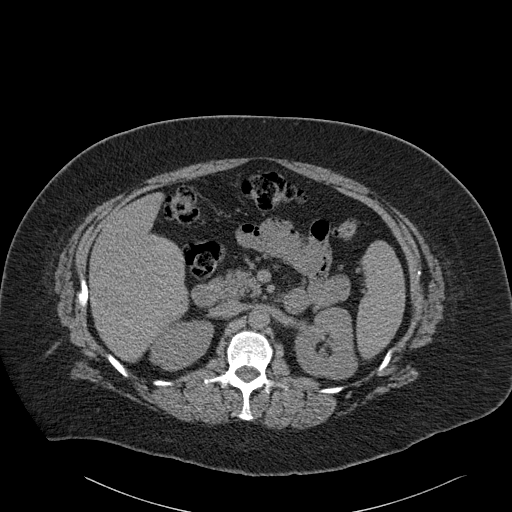
[im 107/160  bone]
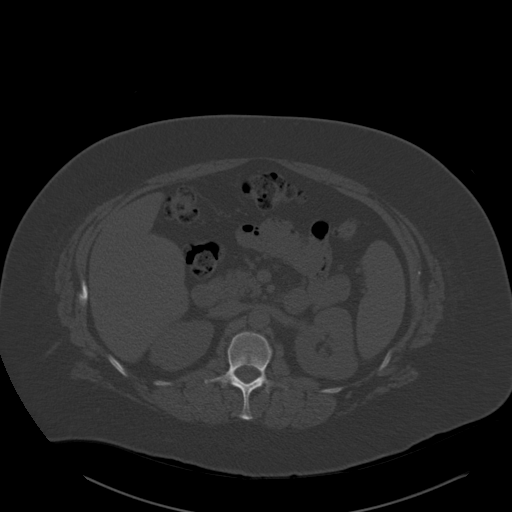
[im 113/160  soft-tissue]
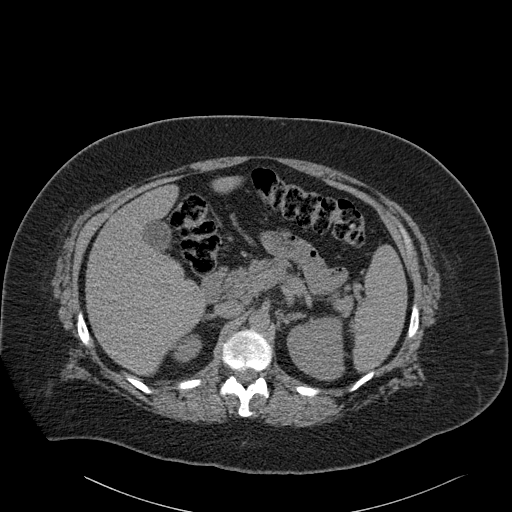
[im 126/160  soft-tissue]
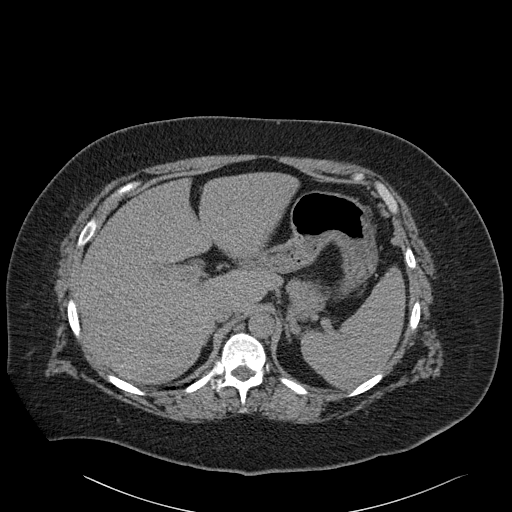
[im 133/160  lung]
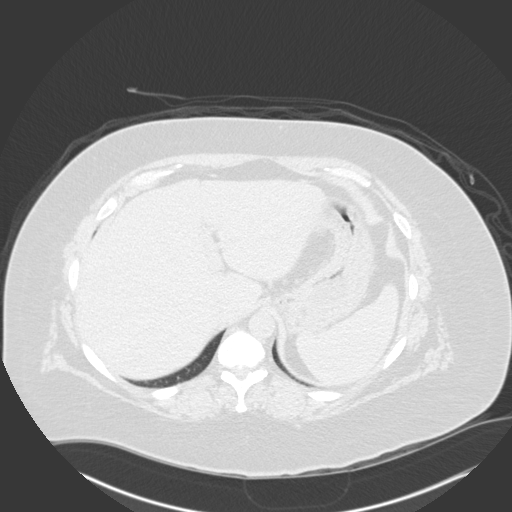
[im 140/160  soft-tissue]
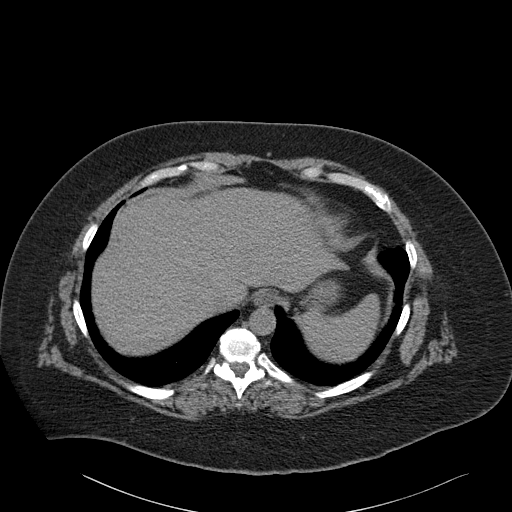
[im 140/160  lung]
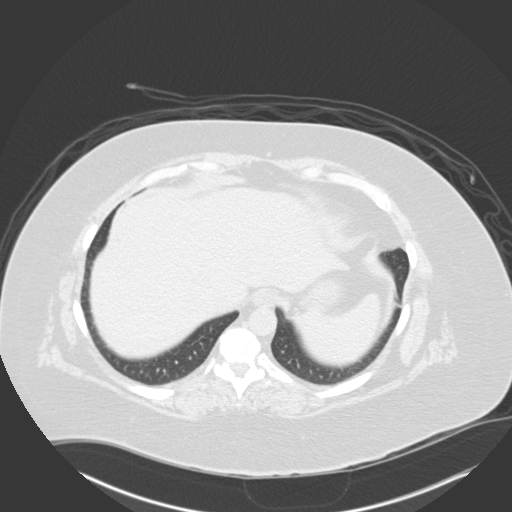
[im 146/160  lung]
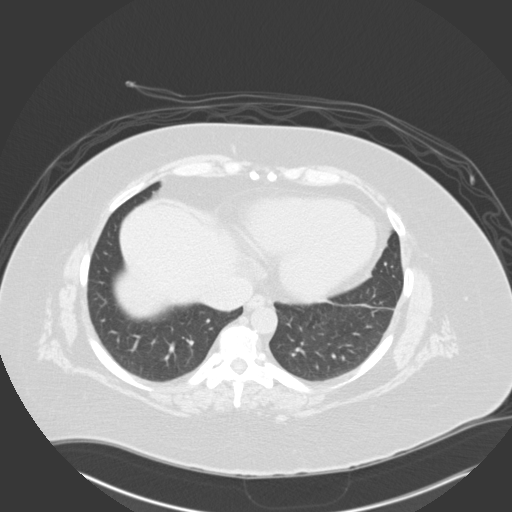
[im 153/160  soft-tissue]
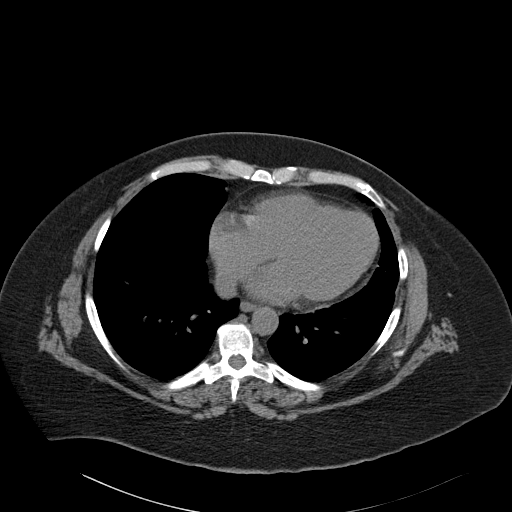
[im 153/160  lung]
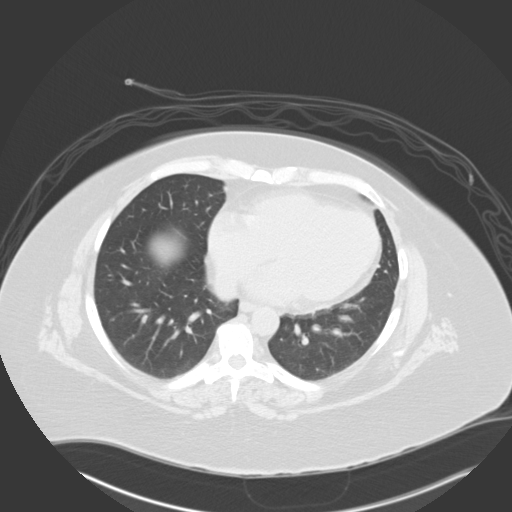

[15 of 32 positions shown; findings below may reference images not displayed]

PROCEDURE:     CT  - CT ABDOMEN /PELVIS WO (STONE)  - October 18, 2011 [DATE]

RESULT:     Axial noncontrast CT scanning was performed through the abdomen
and pelvis with reconstructions at 3 mm intervals and slice thicknesses.
Review of multiplanar reconstructed images was performed separately on the
VIA monitor.

The liver exhibits a stable hypodensity in the left lobe most compatible
with a cyst or hemangioma. There is no intrahepatic ductal dilation. The
gallbladder is adequately distended with no evidence of calcified stones.
The pancreas, spleen, nondistended stomach, adrenal glands, and kidneys
exhibit no acute abnormality. The perinephric fat is normal in density. No
stones are demonstrated within either kidney. Along the course of the
ureters no abnormal calcifications are evident. The partially distended
urinary bladder is normal in appearance.

The uterus is surgically absent. The ovaries are symmetric in size. There is
no free fluid in the pelvis. The unopacified loops of small and large bowel
exhibit no evidence of ileus nor of obstruction. There are no findings to
suggest enteritis or colitis. A normal calibered uninflamed-appearing
appendix is demonstrated. There is no evidence of an inguinal or umbilical
hernia. The psoas musculature is normal in density and contour.

The lung bases are clear. The lumbar vertebral bodies are preserved in
height.
IMPRESSION: 1. There is no evidence of urinary tract obstruction or stones nor acute
inflammatory change.
2. There is no acute bowel abnormality. Specifically there are no findings
to suggest acute appendicitis or colitis.
3. There is stable hypodensity in the left hepatic lobe most compatible with
a cyst or hemangioma.
4. There is no intra-abdominal mass, lymphadenopathy, or splenomegaly.

[REDACTED]

## 2013-03-02 IMAGING — CR DG HAND COMPLETE 3+V*L*
1 series · 3 of 3 positions shown · non-contrast
Comparison: none

REASON FOR EXAM: hand pain
COMMENTS:

PROCEDURE:     DXR - DXR HAND LT COMPLETE  W/OBLIQUES  - October 18, 2011 [DATE]
RESULT:     Comparison:  None

[Series 2: x hand obl left · 0.14mm/px · 3 of 3 slices shown]
[im 1/3]
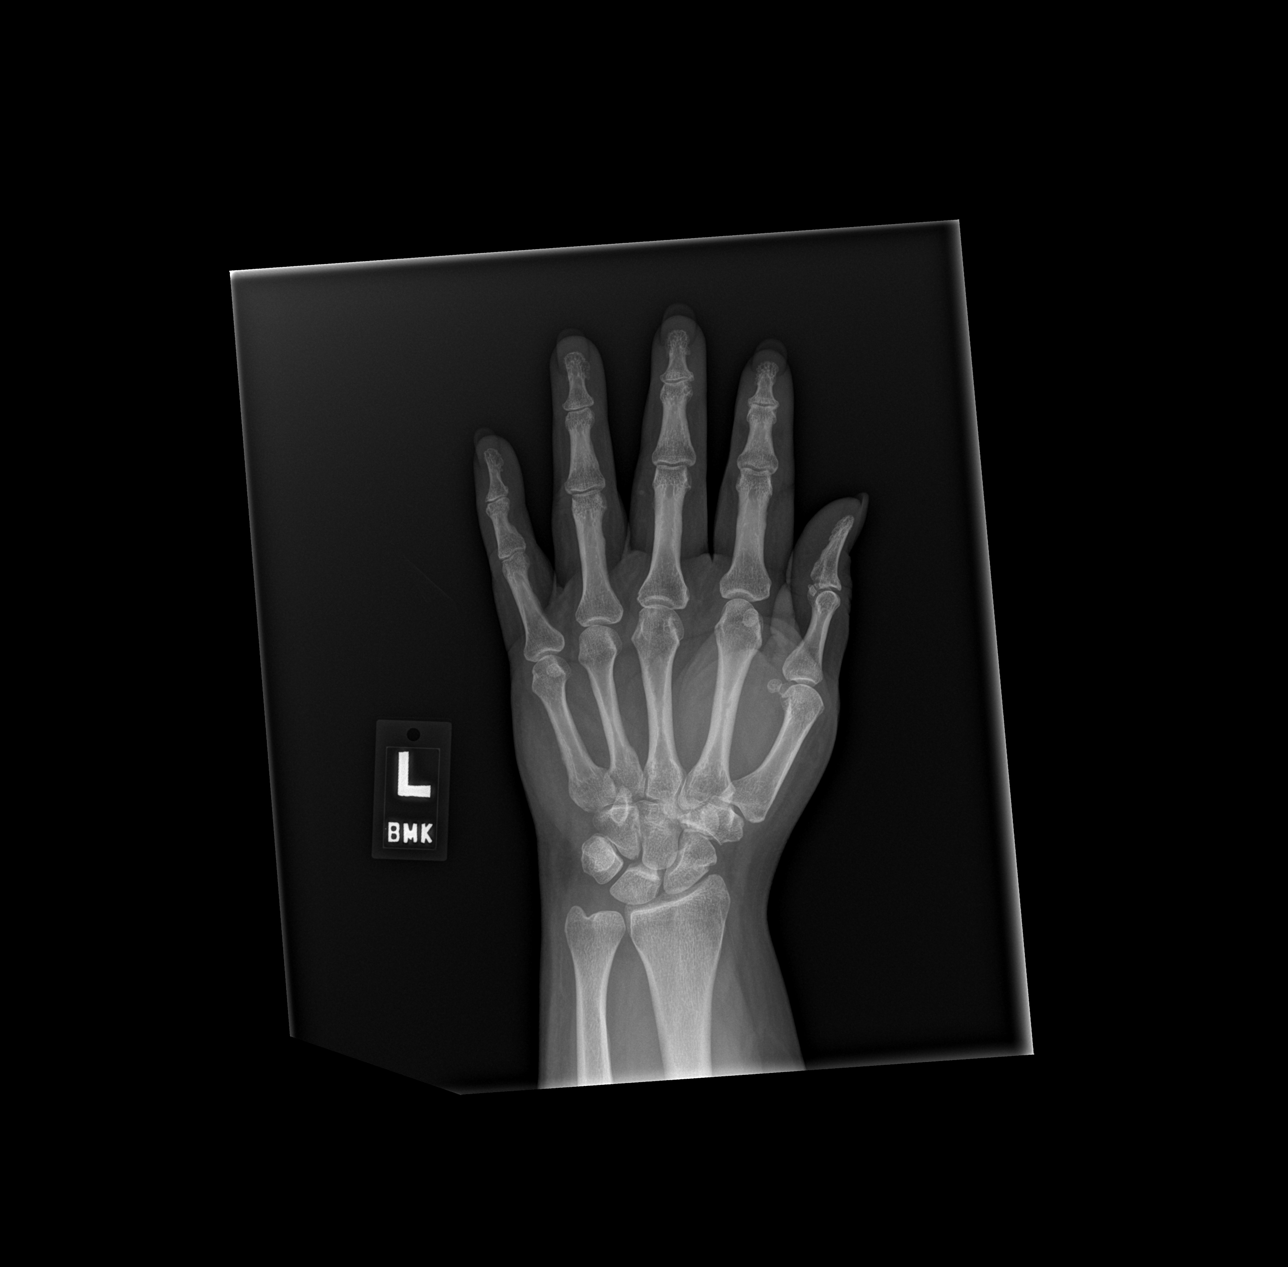
[im 2/3]
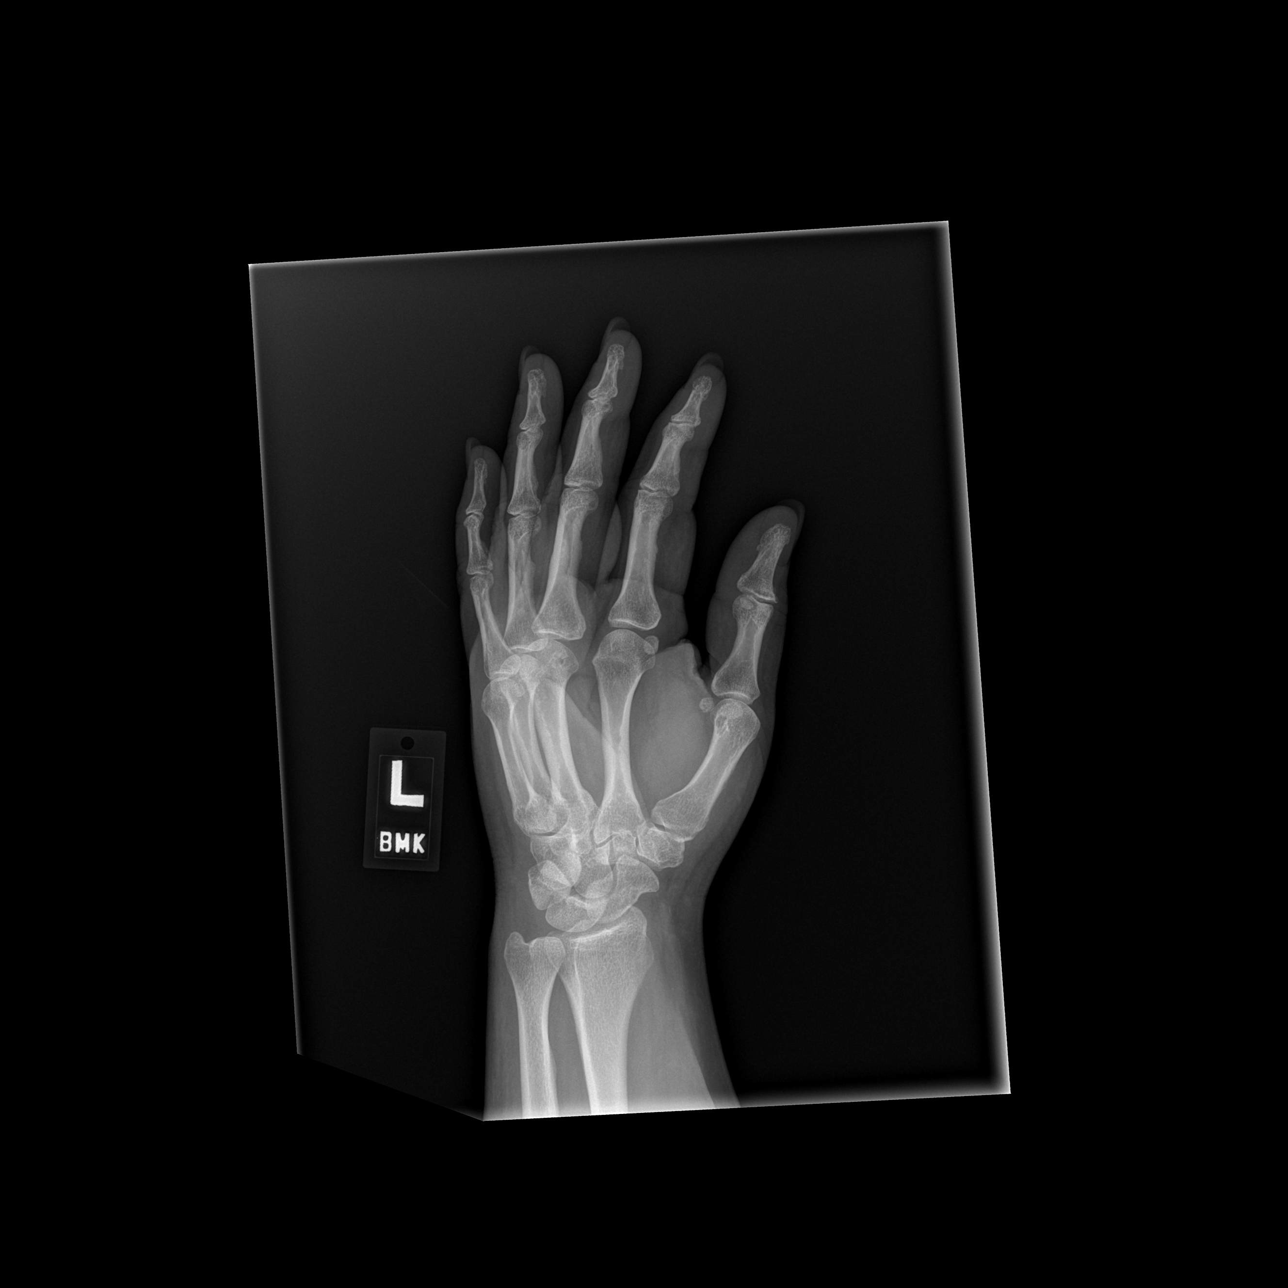
[im 3/3]
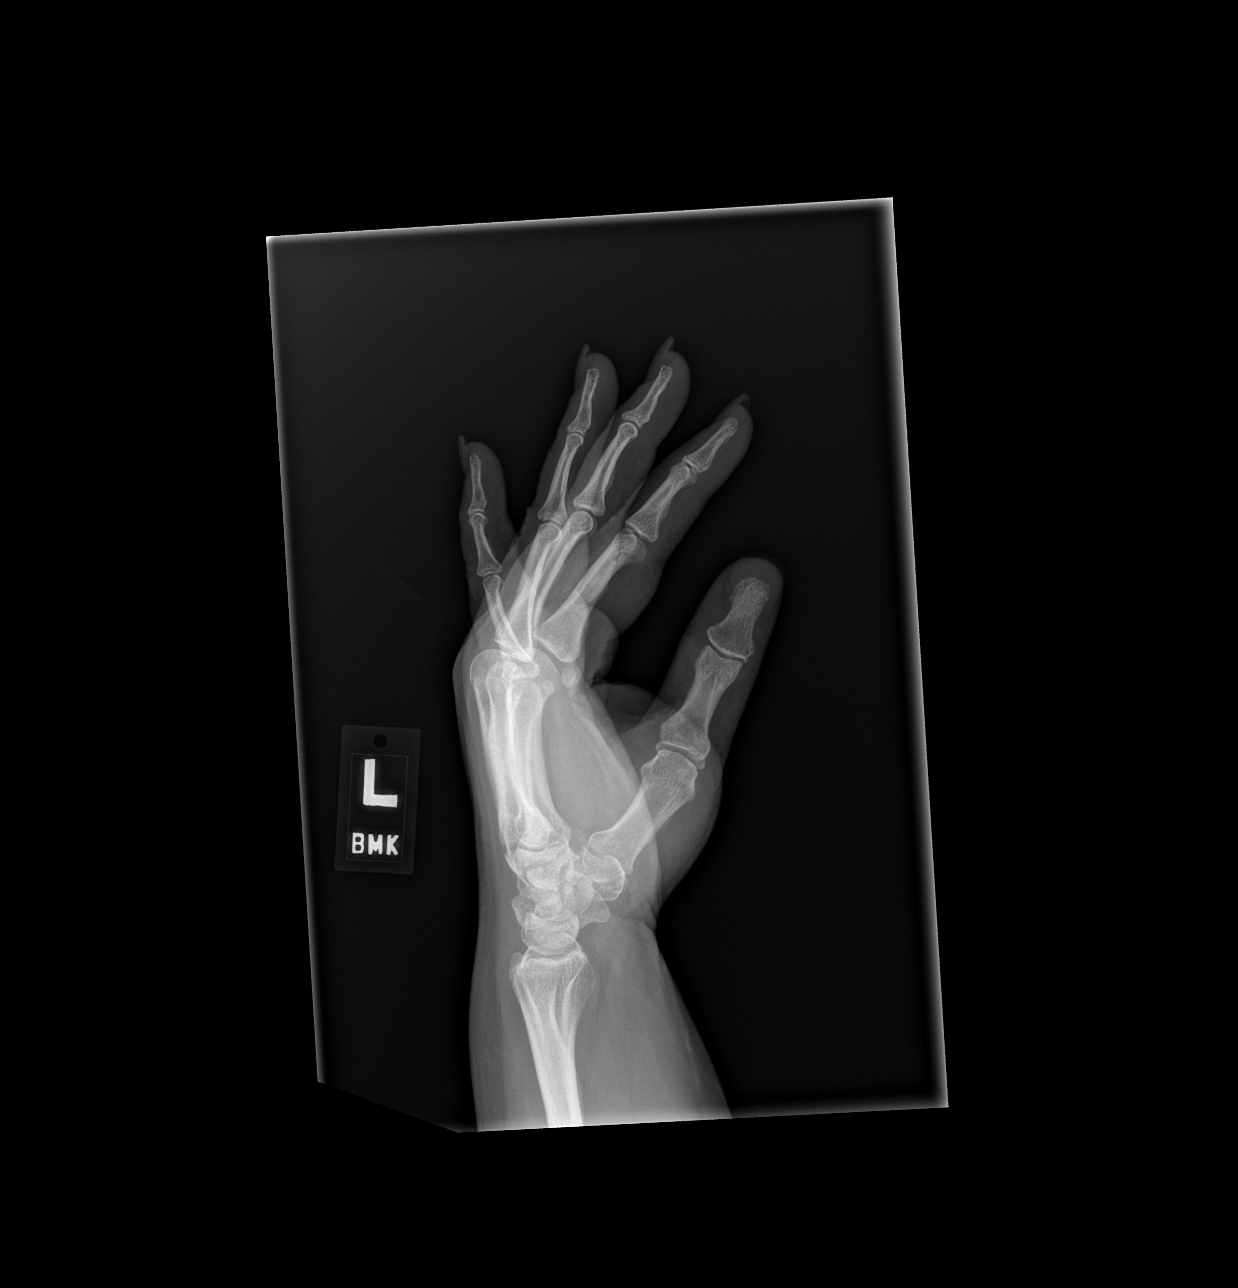

[3 of 3 positions shown; findings below may reference images not displayed]

FINDINGS: AP, oblique, and lateral views of the left hand demonstrates no fracture or
dislocation. There is normal bone mineralization. There are no erosive
changes. The joint spaces are maintained. There is no soft tissue swelling.
IMPRESSION: No acute osseous abnormality of the left hand.

[REDACTED]

## 2013-03-05 ENCOUNTER — Emergency Department: Payer: Self-pay | Admitting: Emergency Medicine

## 2013-03-05 LAB — CBC
HCT: 39.7 % (ref 35.0–47.0)
HGB: 13.9 g/dL (ref 12.0–16.0)
MCH: 32.9 pg (ref 26.0–34.0)
MCHC: 35.1 g/dL (ref 32.0–36.0)
MCV: 94 fL (ref 80–100)
Platelet: 171 10*3/uL (ref 150–440)
RBC: 4.24 10*6/uL (ref 3.80–5.20)
RDW: 13 % (ref 11.5–14.5)
WBC: 6.9 10*3/uL (ref 3.6–11.0)

## 2013-03-05 LAB — DRUG SCREEN, URINE
Amphetamines, Ur Screen: NEGATIVE (ref ?–1000)
Barbiturates, Ur Screen: NEGATIVE (ref ?–200)
Benzodiazepine, Ur Scrn: POSITIVE (ref ?–200)
Cannabinoid 50 Ng, Ur ~~LOC~~: NEGATIVE (ref ?–50)
Cocaine Metabolite,Ur ~~LOC~~: NEGATIVE (ref ?–300)
MDMA (Ecstasy)Ur Screen: NEGATIVE (ref ?–500)
Methadone, Ur Screen: NEGATIVE (ref ?–300)
Opiate, Ur Screen: POSITIVE (ref ?–300)
Phencyclidine (PCP) Ur S: NEGATIVE (ref ?–25)
Tricyclic, Ur Screen: NEGATIVE (ref ?–1000)

## 2013-03-05 LAB — URINALYSIS, COMPLETE
Bacteria: NONE SEEN
Bilirubin,UR: NEGATIVE
Blood: NEGATIVE
Glucose,UR: NEGATIVE mg/dL (ref 0–75)
Hyaline Cast: 3
Ketone: NEGATIVE
Leukocyte Esterase: NEGATIVE
Nitrite: NEGATIVE
Ph: 5 (ref 4.5–8.0)
Protein: NEGATIVE
RBC,UR: 1 /HPF (ref 0–5)
Specific Gravity: 1.015 (ref 1.003–1.030)
Squamous Epithelial: 1
WBC UR: 1 /HPF (ref 0–5)

## 2013-03-05 LAB — BASIC METABOLIC PANEL
Anion Gap: 7 (ref 7–16)
BUN: 9 mg/dL (ref 7–18)
Calcium, Total: 8.3 mg/dL — ABNORMAL LOW (ref 8.5–10.1)
Chloride: 102 mmol/L (ref 98–107)
Co2: 30 mmol/L (ref 21–32)
Creatinine: 0.82 mg/dL (ref 0.60–1.30)
EGFR (African American): >60
EGFR (Non-African Amer.): >60
Glucose: 122 mg/dL — ABNORMAL HIGH (ref 65–99)
Osmolality: 278 (ref 275–301)
Potassium: 3.1 mmol/L — ABNORMAL LOW (ref 3.5–5.1)
Sodium: 139 mmol/L (ref 136–145)

## 2013-03-05 LAB — TROPONIN I: Troponin-I: <0.02 ng/mL

## 2013-04-26 ENCOUNTER — Observation Stay: Payer: Self-pay | Admitting: Internal Medicine

## 2013-04-26 LAB — CBC
HCT: 41.9 % (ref 35.0–47.0)
HGB: 14.3 g/dL (ref 12.0–16.0)
MCH: 32.9 pg (ref 26.0–34.0)
MCHC: 34.1 g/dL (ref 32.0–36.0)
MCV: 96 fL (ref 80–100)
Platelet: 172 10*3/uL (ref 150–440)
RBC: 4.35 10*6/uL (ref 3.80–5.20)
RDW: 13.3 % (ref 11.5–14.5)
WBC: 6.5 10*3/uL (ref 3.6–11.0)

## 2013-04-26 LAB — BASIC METABOLIC PANEL
Anion Gap: 7 (ref 7–16)
BUN: 11 mg/dL (ref 7–18)
Calcium, Total: 8.3 mg/dL — ABNORMAL LOW (ref 8.5–10.1)
Chloride: 105 mmol/L (ref 98–107)
Co2: 24 mmol/L (ref 21–32)
Creatinine: 0.66 mg/dL (ref 0.60–1.30)
EGFR (African American): >60
EGFR (Non-African Amer.): >60
Glucose: 288 mg/dL — ABNORMAL HIGH (ref 65–99)
Osmolality: 282 (ref 275–301)
Potassium: 4.3 mmol/L (ref 3.5–5.1)
Sodium: 136 mmol/L (ref 136–145)

## 2013-04-26 LAB — TROPONIN I
Troponin-I: <0.02 ng/mL
Troponin-I: <0.02 ng/mL

## 2013-04-26 LAB — HEMOGLOBIN A1C: Hemoglobin A1C: 6.3 % (ref 4.2–6.3)

## 2013-04-27 LAB — D-DIMER(ARMC): D-Dimer: 253 ng/ml

## 2013-04-27 LAB — TROPONIN I: Troponin-I: <0.02 ng/mL

## 2013-04-28 DIAGNOSIS — I517 Cardiomegaly: Secondary | ICD-10-CM

## 2013-05-07 ENCOUNTER — Emergency Department: Payer: Self-pay | Admitting: Emergency Medicine

## 2013-05-07 LAB — CBC WITH DIFFERENTIAL/PLATELET
Basophil #: 0.1 10*3/uL (ref 0.0–0.1)
Basophil %: 1.1 %
Eosinophil #: 0.1 10*3/uL (ref 0.0–0.7)
Eosinophil %: 2 %
HCT: 41.7 % (ref 35.0–47.0)
HGB: 13.7 g/dL (ref 12.0–16.0)
Lymphocyte #: 1.8 10*3/uL (ref 1.0–3.6)
Lymphocyte %: 29.5 %
MCH: 31.8 pg (ref 26.0–34.0)
MCHC: 32.9 g/dL (ref 32.0–36.0)
MCV: 97 fL (ref 80–100)
Monocyte #: 0.5 x10 3/mm (ref 0.2–0.9)
Monocyte %: 7.7 %
Neutrophil #: 3.6 10*3/uL (ref 1.4–6.5)
Neutrophil %: 59.7 %
Platelet: 136 10*3/uL — ABNORMAL LOW (ref 150–440)
RBC: 4.31 10*6/uL (ref 3.80–5.20)
RDW: 13.4 % (ref 11.5–14.5)
WBC: 6 10*3/uL (ref 3.6–11.0)

## 2013-05-07 LAB — COMPREHENSIVE METABOLIC PANEL
Albumin: 3.5 g/dL (ref 3.4–5.0)
Alkaline Phosphatase: 103 U/L
Anion Gap: 7 (ref 7–16)
BUN: 13 mg/dL (ref 7–18)
Bilirubin,Total: 0.5 mg/dL (ref 0.2–1.0)
Calcium, Total: 9.4 mg/dL (ref 8.5–10.1)
Chloride: 98 mmol/L (ref 98–107)
Co2: 29 mmol/L (ref 21–32)
Creatinine: 0.68 mg/dL (ref 0.60–1.30)
EGFR (African American): >60
EGFR (Non-African Amer.): >60
Glucose: 172 mg/dL — ABNORMAL HIGH (ref 65–99)
Osmolality: 272 (ref 275–301)
Potassium: 4.2 mmol/L (ref 3.5–5.1)
SGOT(AST): 96 U/L — ABNORMAL HIGH (ref 15–37)
SGPT (ALT): 129 U/L — ABNORMAL HIGH (ref 12–78)
Sodium: 134 mmol/L — ABNORMAL LOW (ref 136–145)
Total Protein: 7.6 g/dL (ref 6.4–8.2)

## 2013-05-07 LAB — TROPONIN I: Troponin-I: <0.02 ng/mL

## 2013-05-07 LAB — URINALYSIS, COMPLETE
Bacteria: NONE SEEN
Bilirubin,UR: NEGATIVE
Blood: NEGATIVE
Glucose,UR: NEGATIVE mg/dL (ref 0–75)
Ketone: NEGATIVE
Leukocyte Esterase: NEGATIVE
Nitrite: NEGATIVE
Ph: 5 (ref 4.5–8.0)
Protein: NEGATIVE
RBC,UR: 3 /HPF (ref 0–5)
Specific Gravity: 1.023 (ref 1.003–1.030)
Squamous Epithelial: 4
WBC UR: 1 /HPF (ref 0–5)

## 2013-07-05 IMAGING — CR DG CHEST 2V
1 series · 2 of 2 positions shown · non-contrast
Comparison: none

REASON FOR EXAM: SOB
COMMENTS:

[Series 1: pa · 0.17mm/px · 2 of 2 slices shown]
[im 1/2]
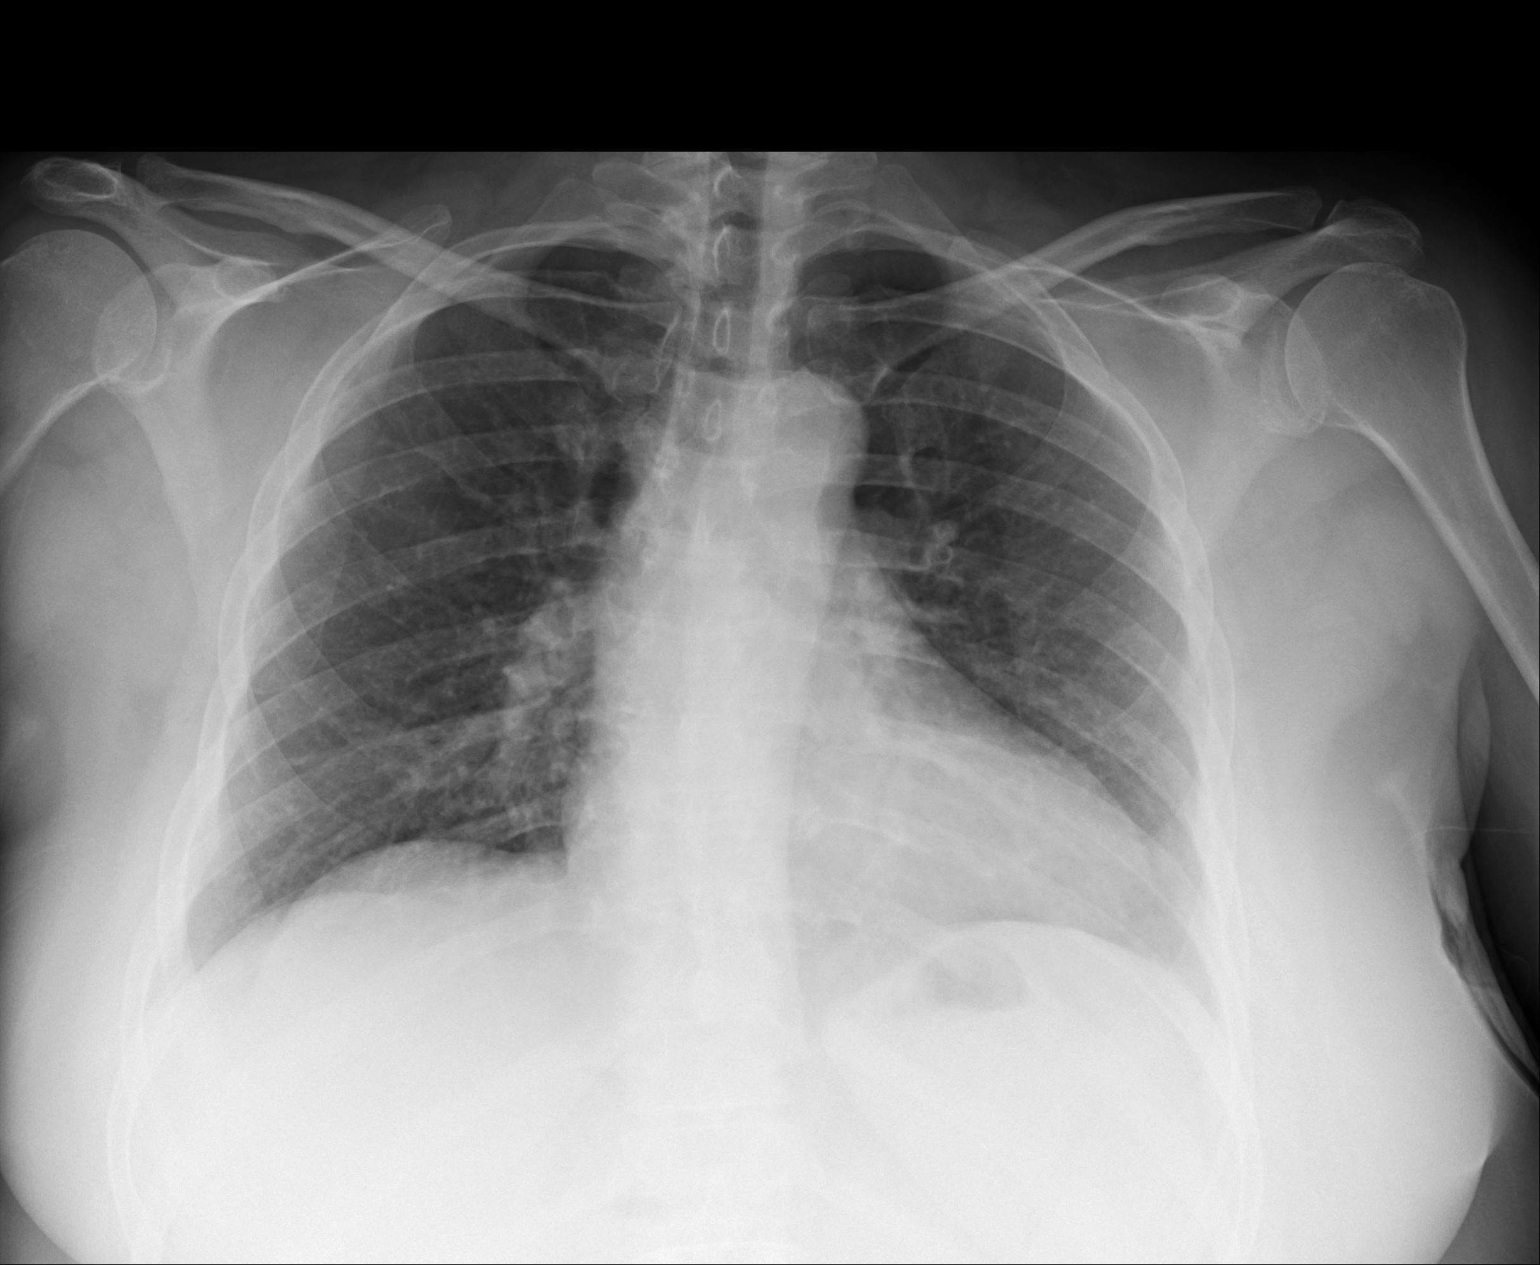
[im 2/2]
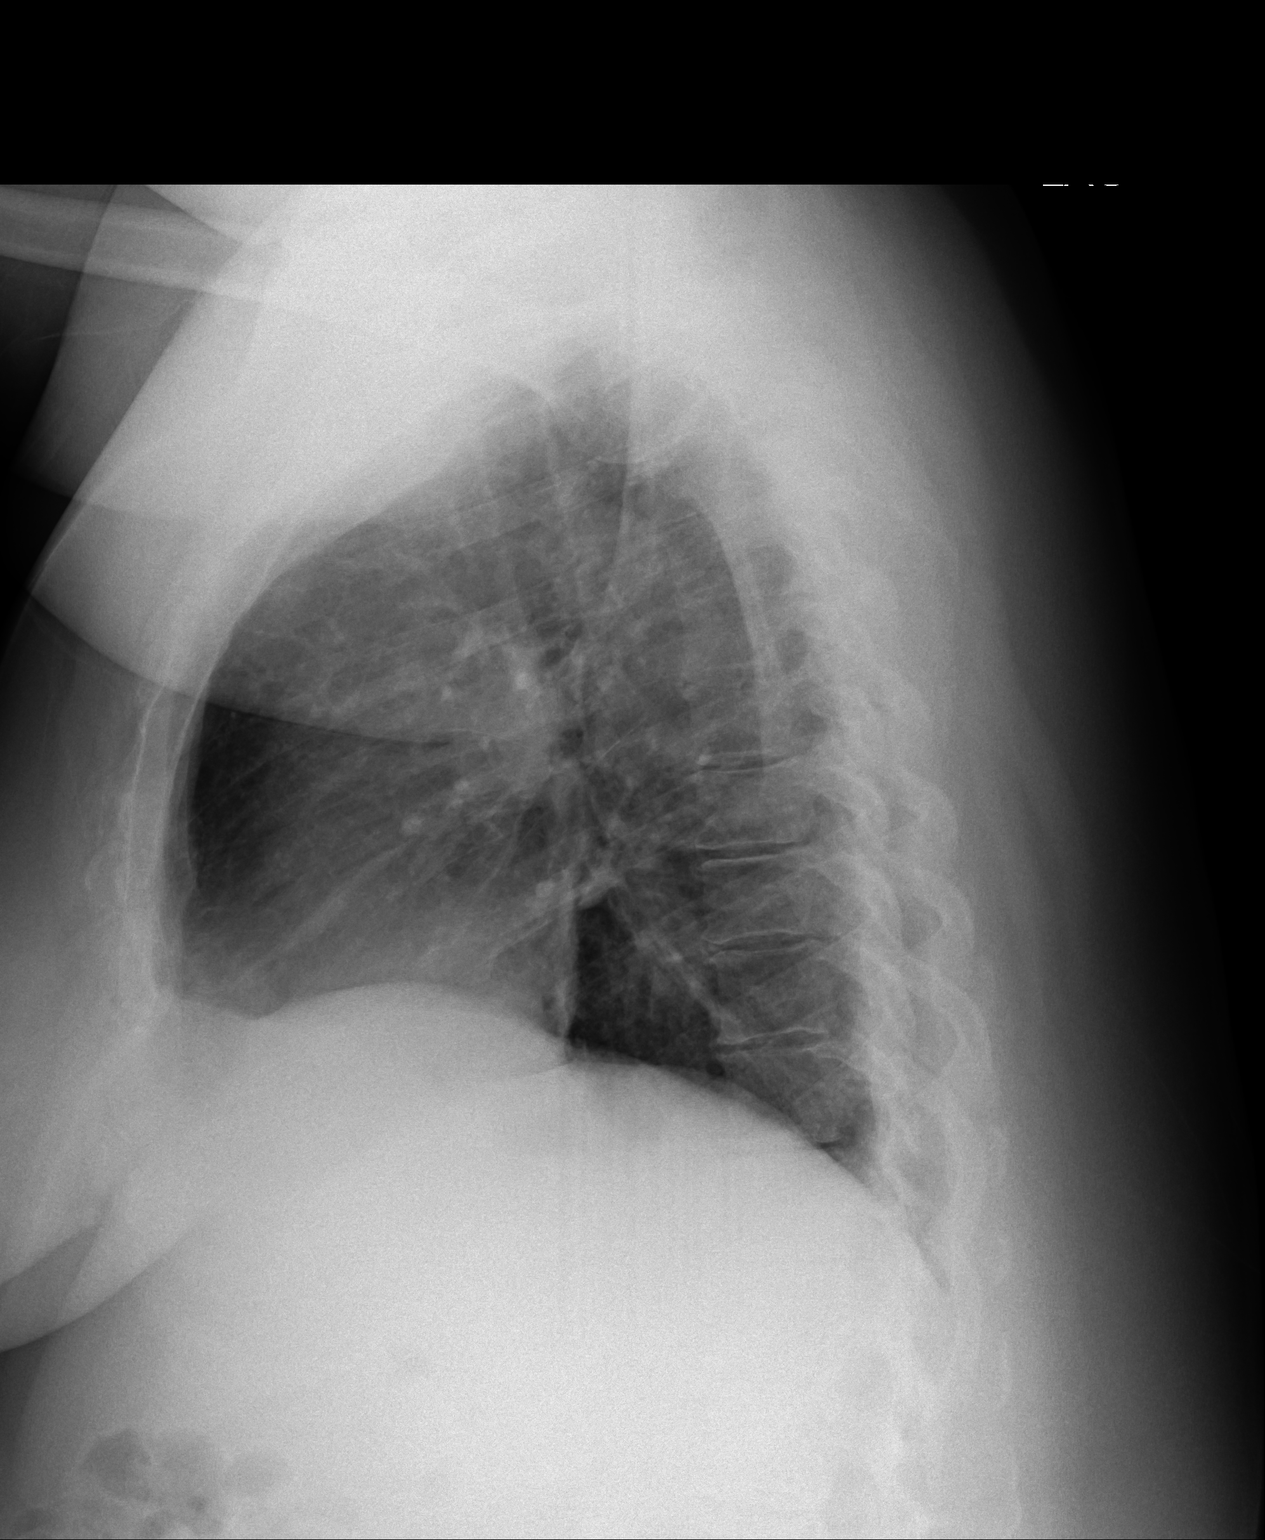

[2 of 2 positions shown; findings below may reference images not displayed]

PROCEDURE:     DXR - DXR CHEST PA (OR AP) AND LATERAL  - February 20, 2012  [DATE]

RESULT:     Comparison is made to the study 10 October, 2011.

The lungs are clear. The heart and pulmonary vessels are normal. The bony
and mediastinal structures are unremarkable. There is no effusion. There is
no pneumothorax or evidence of congestive failure.
IMPRESSION: No acute cardiopulmonary disease. Stable appearance.

[REDACTED]

## 2013-07-08 IMAGING — CR DG CHEST 2V
1 series · 2 of 2 positions shown · non-contrast
Comparison: none

REASON FOR EXAM: SOB
COMMENTS:

[Series 1: w chest pa · 0.14mm/px · 2 of 2 slices shown]
[im 1/2]
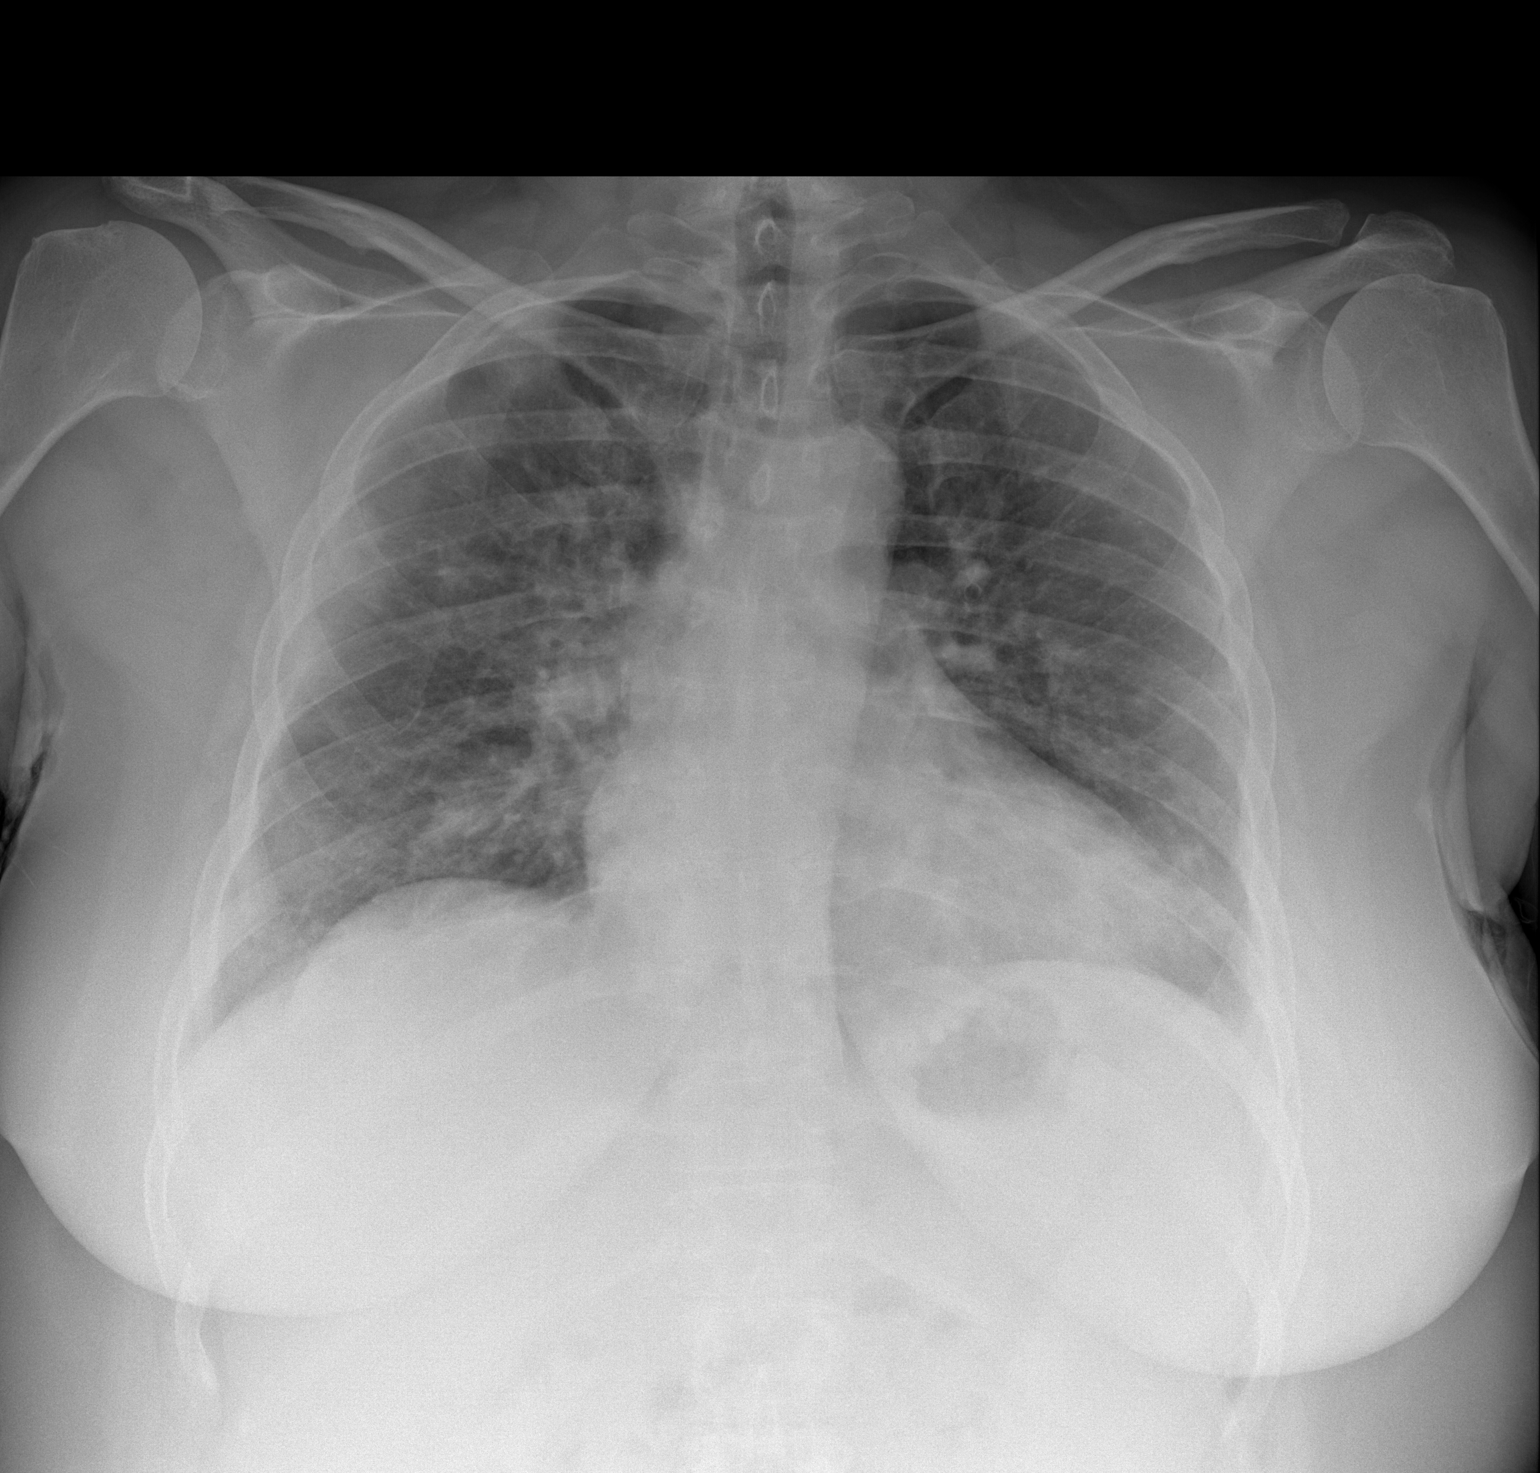
[im 2/2]
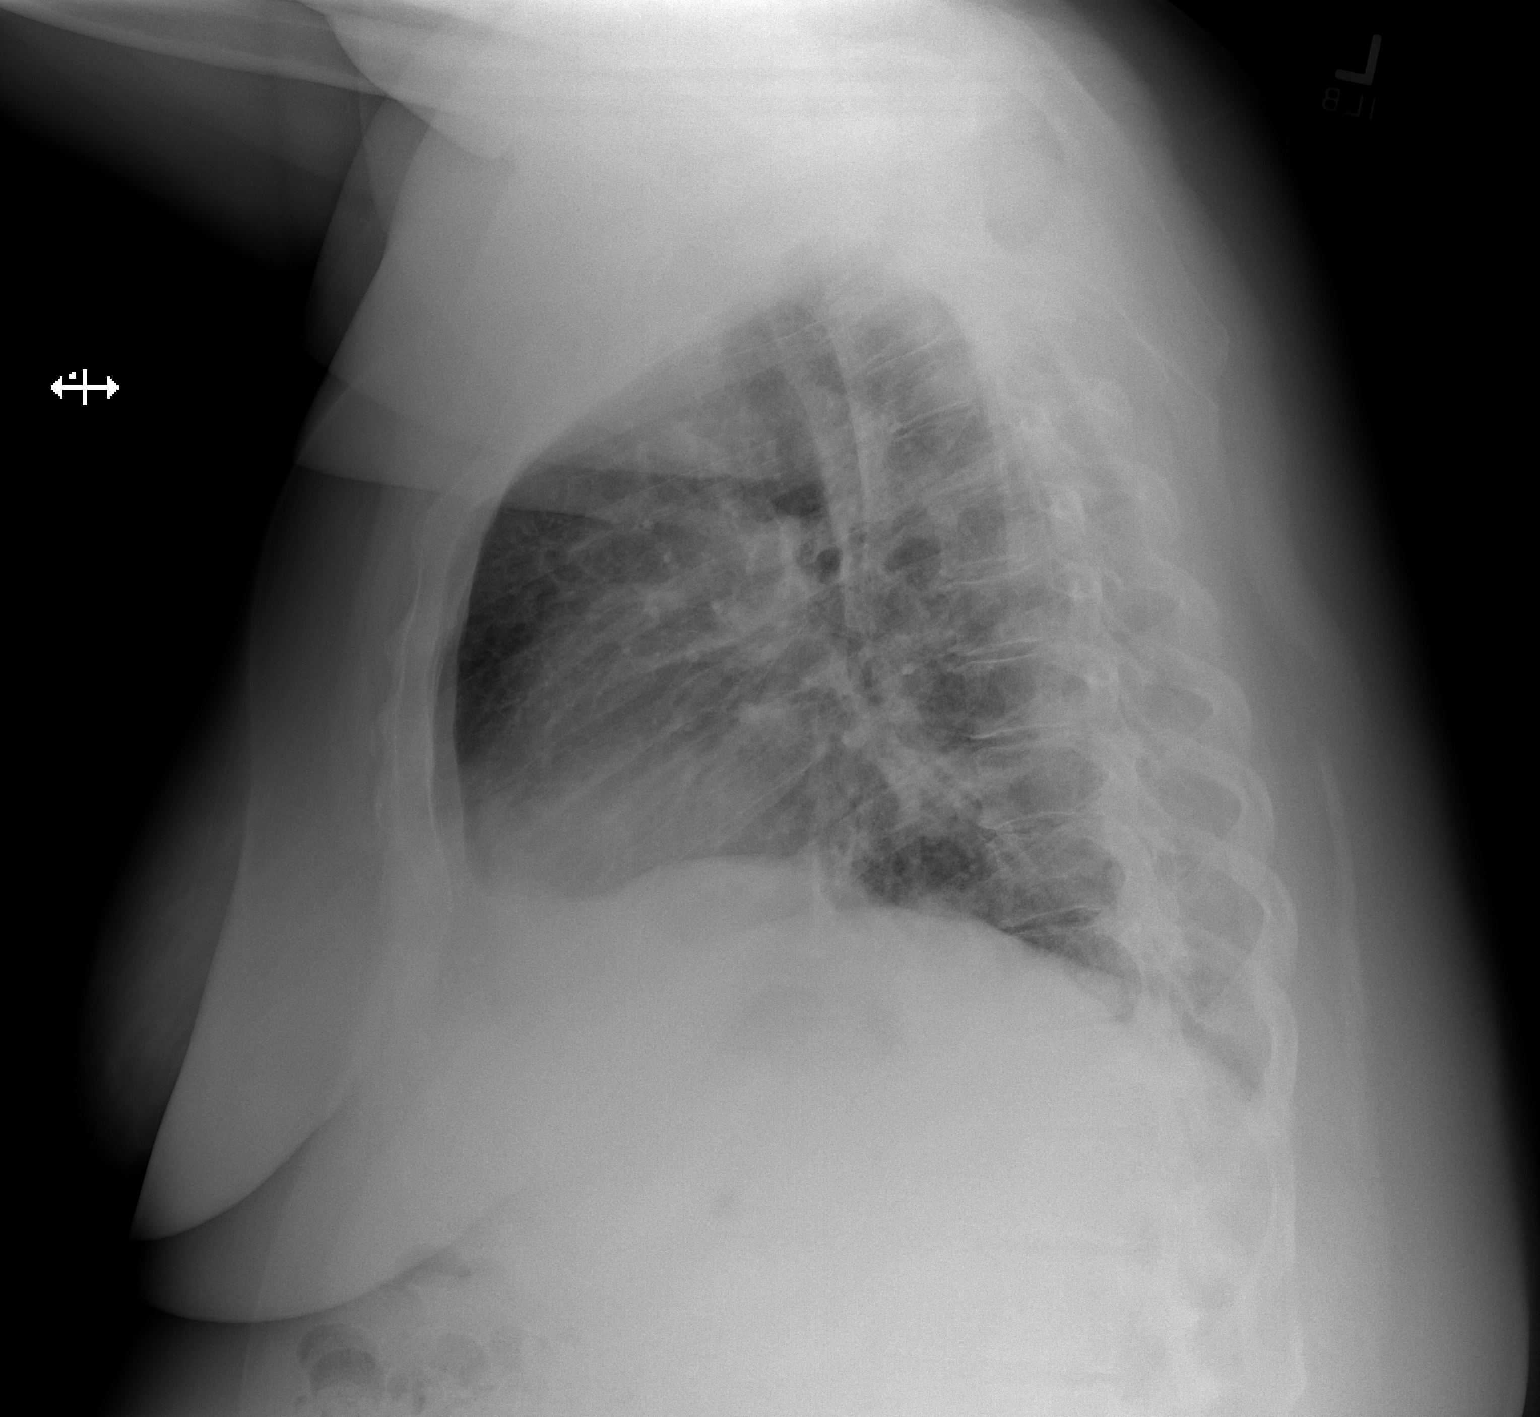

[2 of 2 positions shown; findings below may reference images not displayed]

PROCEDURE:     DXR - DXR CHEST PA (OR AP) AND LATERAL  - February 23, 2012  [DATE]

RESULT:     Comparison is made to the study 20 February, 2012. There is
hypoinflation. Cardiac silhouette is mildly enlarged. There is patchy
increased density in the right infrahilar region concerning for minimal
right lower lobe pneumonia or atelectasis. There is no effusion or
pneumothorax.
IMPRESSION: Cardiomegaly with some minimal right lower lobe infiltrate
or atelectasis.

[REDACTED]

## 2013-08-06 DIAGNOSIS — I5032 Chronic diastolic (congestive) heart failure: Secondary | ICD-10-CM | POA: Insufficient documentation

## 2013-08-06 DIAGNOSIS — I10 Essential (primary) hypertension: Secondary | ICD-10-CM | POA: Insufficient documentation

## 2013-08-06 DIAGNOSIS — J45909 Unspecified asthma, uncomplicated: Secondary | ICD-10-CM | POA: Insufficient documentation

## 2013-08-26 ENCOUNTER — Inpatient Hospital Stay: Payer: Self-pay | Admitting: Internal Medicine

## 2013-08-26 LAB — BASIC METABOLIC PANEL
Anion Gap: 12 (ref 7–16)
BUN: 11 mg/dL (ref 7–18)
Calcium, Total: 7.9 mg/dL — ABNORMAL LOW (ref 8.5–10.1)
Chloride: 98 mmol/L (ref 98–107)
Co2: 31 mmol/L (ref 21–32)
Creatinine: 1.03 mg/dL (ref 0.60–1.30)
EGFR (African American): >60
EGFR (Non-African Amer.): >60
Glucose: 202 mg/dL — ABNORMAL HIGH (ref 65–99)
Osmolality: 286 (ref 275–301)
Potassium: 3.3 mmol/L — ABNORMAL LOW (ref 3.5–5.1)
Sodium: 141 mmol/L (ref 136–145)

## 2013-08-26 LAB — CBC
HCT: 39.3 % (ref 35.0–47.0)
HGB: 13.1 g/dL (ref 12.0–16.0)
MCH: 32.6 pg (ref 26.0–34.0)
MCHC: 33.5 g/dL (ref 32.0–36.0)
MCV: 98 fL (ref 80–100)
Platelet: 135 10*3/uL — ABNORMAL LOW (ref 150–440)
RBC: 4.03 10*6/uL (ref 3.80–5.20)
RDW: 12.9 % (ref 11.5–14.5)
WBC: 10.1 10*3/uL (ref 3.6–11.0)

## 2013-08-26 LAB — CK TOTAL AND CKMB (NOT AT ARMC)
CK, Total: 1282 U/L — ABNORMAL HIGH
CK-MB: 8.6 ng/mL — ABNORMAL HIGH (ref 0.5–3.6)

## 2013-08-26 LAB — PROTIME-INR
INR: 1.1
Prothrombin Time: 14 secs (ref 11.5–14.7)

## 2013-08-26 LAB — DRUG SCREEN, URINE
Amphetamines, Ur Screen: NEGATIVE (ref ?–1000)
Barbiturates, Ur Screen: NEGATIVE (ref ?–200)
Benzodiazepine, Ur Scrn: POSITIVE (ref ?–200)
Cannabinoid 50 Ng, Ur ~~LOC~~: NEGATIVE (ref ?–50)
Cocaine Metabolite,Ur ~~LOC~~: NEGATIVE (ref ?–300)
MDMA (Ecstasy)Ur Screen: NEGATIVE (ref ?–500)
Methadone, Ur Screen: NEGATIVE (ref ?–300)
Opiate, Ur Screen: POSITIVE (ref ?–300)
Phencyclidine (PCP) Ur S: NEGATIVE (ref ?–25)
Tricyclic, Ur Screen: NEGATIVE (ref ?–1000)

## 2013-08-26 LAB — CK-MB: CK-MB: 1.6 ng/mL (ref 0.5–3.6)

## 2013-08-26 LAB — TROPONIN I
Troponin-I: 0.36 ng/mL — ABNORMAL HIGH
Troponin-I: 0.37 ng/mL — ABNORMAL HIGH
Troponin-I: 0.37 ng/mL — ABNORMAL HIGH

## 2013-08-26 LAB — APTT: Activated PTT: 24.6 secs (ref 23.6–35.9)

## 2013-08-26 LAB — HEPARIN LEVEL (UNFRACTIONATED): Anti-Xa(Unfractionated): <0.1 IU/mL — ABNORMAL LOW (ref 0.30–0.70)

## 2013-08-27 ENCOUNTER — Other Ambulatory Visit: Payer: Self-pay | Admitting: Nurse Practitioner

## 2013-08-27 DIAGNOSIS — J9620 Acute and chronic respiratory failure, unspecified whether with hypoxia or hypercapnia: Secondary | ICD-10-CM

## 2013-08-27 DIAGNOSIS — R7989 Other specified abnormal findings of blood chemistry: Secondary | ICD-10-CM

## 2013-08-27 LAB — CBC WITH DIFFERENTIAL/PLATELET
Basophil #: 0 10*3/uL (ref 0.0–0.1)
Basophil %: 0.4 %
Eosinophil #: 0.1 10*3/uL (ref 0.0–0.7)
Eosinophil %: 1.3 %
HCT: 39.1 % (ref 35.0–47.0)
HGB: 13.1 g/dL (ref 12.0–16.0)
Lymphocyte #: 1.6 10*3/uL (ref 1.0–3.6)
Lymphocyte %: 25.6 %
MCH: 32.4 pg (ref 26.0–34.0)
MCHC: 33.4 g/dL (ref 32.0–36.0)
MCV: 97 fL (ref 80–100)
Monocyte #: 0.4 x10 3/mm (ref 0.2–0.9)
Monocyte %: 6.1 %
Neutrophil #: 4.1 10*3/uL (ref 1.4–6.5)
Neutrophil %: 66.6 %
Platelet: 138 10*3/uL — ABNORMAL LOW (ref 150–440)
RBC: 4.04 10*6/uL (ref 3.80–5.20)
RDW: 12.9 % (ref 11.5–14.5)
WBC: 6.2 10*3/uL (ref 3.6–11.0)

## 2013-08-27 LAB — BASIC METABOLIC PANEL
Anion Gap: 10 (ref 7–16)
BUN: 11 mg/dL (ref 7–18)
Calcium, Total: 8.3 mg/dL — ABNORMAL LOW (ref 8.5–10.1)
Chloride: 95 mmol/L — ABNORMAL LOW (ref 98–107)
Co2: 36 mmol/L — ABNORMAL HIGH (ref 21–32)
Creatinine: 0.99 mg/dL (ref 0.60–1.30)
EGFR (African American): >60
EGFR (Non-African Amer.): >60
Glucose: 156 mg/dL — ABNORMAL HIGH (ref 65–99)
Osmolality: 284 (ref 275–301)
Potassium: 2.8 mmol/L — ABNORMAL LOW (ref 3.5–5.1)
Sodium: 141 mmol/L (ref 136–145)

## 2013-08-28 DIAGNOSIS — R7989 Other specified abnormal findings of blood chemistry: Secondary | ICD-10-CM

## 2013-08-28 DIAGNOSIS — R079 Chest pain, unspecified: Secondary | ICD-10-CM

## 2013-08-28 LAB — BASIC METABOLIC PANEL
Anion Gap: 10 (ref 7–16)
BUN: 13 mg/dL (ref 7–18)
Calcium, Total: 9.4 mg/dL (ref 8.5–10.1)
Chloride: 93 mmol/L — ABNORMAL LOW (ref 98–107)
Co2: 36 mmol/L — ABNORMAL HIGH (ref 21–32)
Creatinine: 0.91 mg/dL (ref 0.60–1.30)
EGFR (African American): >60
EGFR (Non-African Amer.): >60
Glucose: 130 mg/dL — ABNORMAL HIGH (ref 65–99)
Osmolality: 279 (ref 275–301)
Potassium: 3 mmol/L — ABNORMAL LOW (ref 3.5–5.1)
Sodium: 139 mmol/L (ref 136–145)

## 2013-08-31 LAB — CULTURE, BLOOD (SINGLE)

## 2013-11-16 ENCOUNTER — Observation Stay: Payer: Self-pay | Admitting: Internal Medicine

## 2013-11-16 LAB — COMPREHENSIVE METABOLIC PANEL
Albumin: 3.7 g/dL (ref 3.4–5.0)
Alkaline Phosphatase: 118 U/L — ABNORMAL HIGH
Anion Gap: 10 (ref 7–16)
BUN: 8 mg/dL (ref 7–18)
Bilirubin,Total: 0.6 mg/dL (ref 0.2–1.0)
Calcium, Total: 7.8 mg/dL — ABNORMAL LOW (ref 8.5–10.1)
Chloride: 101 mmol/L (ref 98–107)
Co2: 32 mmol/L (ref 21–32)
Creatinine: 0.86 mg/dL (ref 0.60–1.30)
EGFR (African American): >60
EGFR (Non-African Amer.): >60
Glucose: 183 mg/dL — ABNORMAL HIGH (ref 65–99)
Osmolality: 288 (ref 275–301)
Potassium: 3.5 mmol/L (ref 3.5–5.1)
SGOT(AST): 118 U/L — ABNORMAL HIGH (ref 15–37)
SGPT (ALT): 110 U/L — ABNORMAL HIGH
Sodium: 143 mmol/L (ref 136–145)
Total Protein: 8.1 g/dL (ref 6.4–8.2)

## 2013-11-16 LAB — URINALYSIS, COMPLETE
Bacteria: NONE SEEN
Bilirubin,UR: NEGATIVE
Blood: NEGATIVE
Glucose,UR: 50 mg/dL (ref 0–75)
Hyaline Cast: 6
Ketone: NEGATIVE
Leukocyte Esterase: NEGATIVE
Nitrite: NEGATIVE
Ph: 5 (ref 4.5–8.0)
Protein: NEGATIVE
RBC,UR: <1 /HPF (ref 0–5)
Specific Gravity: 1.021 (ref 1.003–1.030)
Squamous Epithelial: 5
WBC UR: 1 /HPF (ref 0–5)

## 2013-11-16 LAB — CBC
HCT: 39.1 % (ref 35.0–47.0)
HGB: 13.6 g/dL (ref 12.0–16.0)
MCH: 33.3 pg (ref 26.0–34.0)
MCHC: 34.7 g/dL (ref 32.0–36.0)
MCV: 96 fL (ref 80–100)
Platelet: 129 10*3/uL — ABNORMAL LOW (ref 150–440)
RBC: 4.07 10*6/uL (ref 3.80–5.20)
RDW: 13.6 % (ref 11.5–14.5)
WBC: 8.1 10*3/uL (ref 3.6–11.0)

## 2013-11-16 LAB — LIPASE, BLOOD: Lipase: 118 U/L (ref 73–393)

## 2013-11-17 LAB — BASIC METABOLIC PANEL
Anion Gap: 7 (ref 7–16)
BUN: 17 mg/dL (ref 7–18)
Calcium, Total: 8.6 mg/dL (ref 8.5–10.1)
Chloride: 99 mmol/L (ref 98–107)
Co2: 34 mmol/L — ABNORMAL HIGH (ref 21–32)
Creatinine: 0.94 mg/dL (ref 0.60–1.30)
EGFR (African American): >60
EGFR (Non-African Amer.): >60
Glucose: 206 mg/dL — ABNORMAL HIGH (ref 65–99)
Osmolality: 287 (ref 275–301)
Potassium: 3.4 mmol/L — ABNORMAL LOW (ref 3.5–5.1)
Sodium: 140 mmol/L (ref 136–145)

## 2013-11-17 LAB — HEMOGLOBIN A1C: Hemoglobin A1C: 8 % — ABNORMAL HIGH (ref 4.2–6.3)

## 2013-11-17 LAB — LIPID PANEL
Cholesterol: 131 mg/dL (ref 0–200)
HDL Cholesterol: 19 mg/dL — ABNORMAL LOW (ref 40–60)
Ldl Cholesterol, Calc: 58 mg/dL (ref 0–100)
Triglycerides: 269 mg/dL — ABNORMAL HIGH (ref 0–200)
VLDL Cholesterol, Calc: 54 mg/dL — ABNORMAL HIGH (ref 5–40)

## 2013-11-17 LAB — TSH: Thyroid Stimulating Horm: 0.314 u[IU]/mL — ABNORMAL LOW

## 2014-01-09 DIAGNOSIS — R0602 Shortness of breath: Secondary | ICD-10-CM | POA: Insufficient documentation

## 2014-01-09 DIAGNOSIS — R1012 Left upper quadrant pain: Secondary | ICD-10-CM

## 2014-01-09 HISTORY — DX: Left upper quadrant pain: R10.12

## 2014-03-10 ENCOUNTER — Emergency Department: Payer: Self-pay | Admitting: Emergency Medicine

## 2014-03-26 DIAGNOSIS — M47817 Spondylosis without myelopathy or radiculopathy, lumbosacral region: Secondary | ICD-10-CM | POA: Insufficient documentation

## 2014-03-26 DIAGNOSIS — M7918 Myalgia, other site: Secondary | ICD-10-CM | POA: Insufficient documentation

## 2014-03-26 DIAGNOSIS — M47816 Spondylosis without myelopathy or radiculopathy, lumbar region: Secondary | ICD-10-CM | POA: Insufficient documentation

## 2014-03-26 DIAGNOSIS — M6089 Other myositis, multiple sites: Secondary | ICD-10-CM | POA: Insufficient documentation

## 2014-03-26 DIAGNOSIS — M6080 Other myositis, unspecified site: Secondary | ICD-10-CM | POA: Insufficient documentation

## 2014-03-26 DIAGNOSIS — M48061 Spinal stenosis, lumbar region without neurogenic claudication: Secondary | ICD-10-CM | POA: Insufficient documentation

## 2014-03-27 ENCOUNTER — Emergency Department: Payer: Self-pay | Admitting: Emergency Medicine

## 2014-03-27 LAB — COMPREHENSIVE METABOLIC PANEL
Albumin: 5.3 g/dL — ABNORMAL HIGH
Alkaline Phosphatase: 63 U/L
Anion Gap: 14 (ref 7–16)
BUN: 16 mg/dL
Bilirubin,Total: 1.5 mg/dL — ABNORMAL HIGH
Calcium, Total: 10.4 mg/dL — ABNORMAL HIGH
Chloride: 101 mmol/L
Co2: 27 mmol/L
Creatinine: 0.73 mg/dL
EGFR (African American): >60
EGFR (Non-African Amer.): >60
Glucose: 119 mg/dL — ABNORMAL HIGH
Potassium: 3.5 mmol/L
SGOT(AST): 81 U/L — ABNORMAL HIGH
SGPT (ALT): 58 U/L — ABNORMAL HIGH
Sodium: 142 mmol/L
Total Protein: 9.4 g/dL — ABNORMAL HIGH

## 2014-03-27 LAB — URINALYSIS, COMPLETE
Bilirubin,UR: NEGATIVE
Blood: NEGATIVE
Glucose,UR: NEGATIVE mg/dL (ref 0–75)
Ketone: NEGATIVE
Leukocyte Esterase: NEGATIVE
Nitrite: NEGATIVE
Ph: 5 (ref 4.5–8.0)
Protein: 100
RBC,UR: NONE SEEN /HPF (ref 0–5)
Specific Gravity: 1.029 (ref 1.003–1.030)
Squamous Epithelial: 11
WBC UR: <1 /HPF (ref 0–5)

## 2014-03-27 LAB — CBC WITH DIFFERENTIAL/PLATELET
Basophil #: 0.1 10*3/uL (ref 0.0–0.1)
Basophil %: 0.8 %
Eosinophil #: 0 10*3/uL (ref 0.0–0.7)
Eosinophil %: 0.4 %
HCT: 49.3 % — ABNORMAL HIGH (ref 35.0–47.0)
HGB: 16.5 g/dL — ABNORMAL HIGH (ref 12.0–16.0)
Lymphocyte #: 2.2 10*3/uL (ref 1.0–3.6)
Lymphocyte %: 23.7 %
MCH: 32 pg (ref 26.0–34.0)
MCHC: 33.4 g/dL (ref 32.0–36.0)
MCV: 96 fL (ref 80–100)
Monocyte #: 0.5 x10 3/mm (ref 0.2–0.9)
Monocyte %: 5.6 %
Neutrophil #: 6.4 10*3/uL (ref 1.4–6.5)
Neutrophil %: 69.5 %
Platelet: 209 10*3/uL (ref 150–440)
RBC: 5.16 10*6/uL (ref 3.80–5.20)
RDW: 13.6 % (ref 11.5–14.5)
WBC: 9.2 10*3/uL (ref 3.6–11.0)

## 2014-03-27 LAB — LIPASE, BLOOD: Lipase: 31 U/L

## 2014-03-27 LAB — TROPONIN I: Troponin-I: <0.03 ng/mL

## 2014-03-27 IMAGING — CR DG CHEST 2V
1 series · 2 of 2 positions shown · non-contrast
Comparison: 02/23/2012

CLINICAL DATA: Shortness of breath, left chest pain

EXAM:
CHEST  2 VIEW

[Series 1: w chest pa · 0.14mm/px · 2 of 2 slices shown]
[im 1/2]
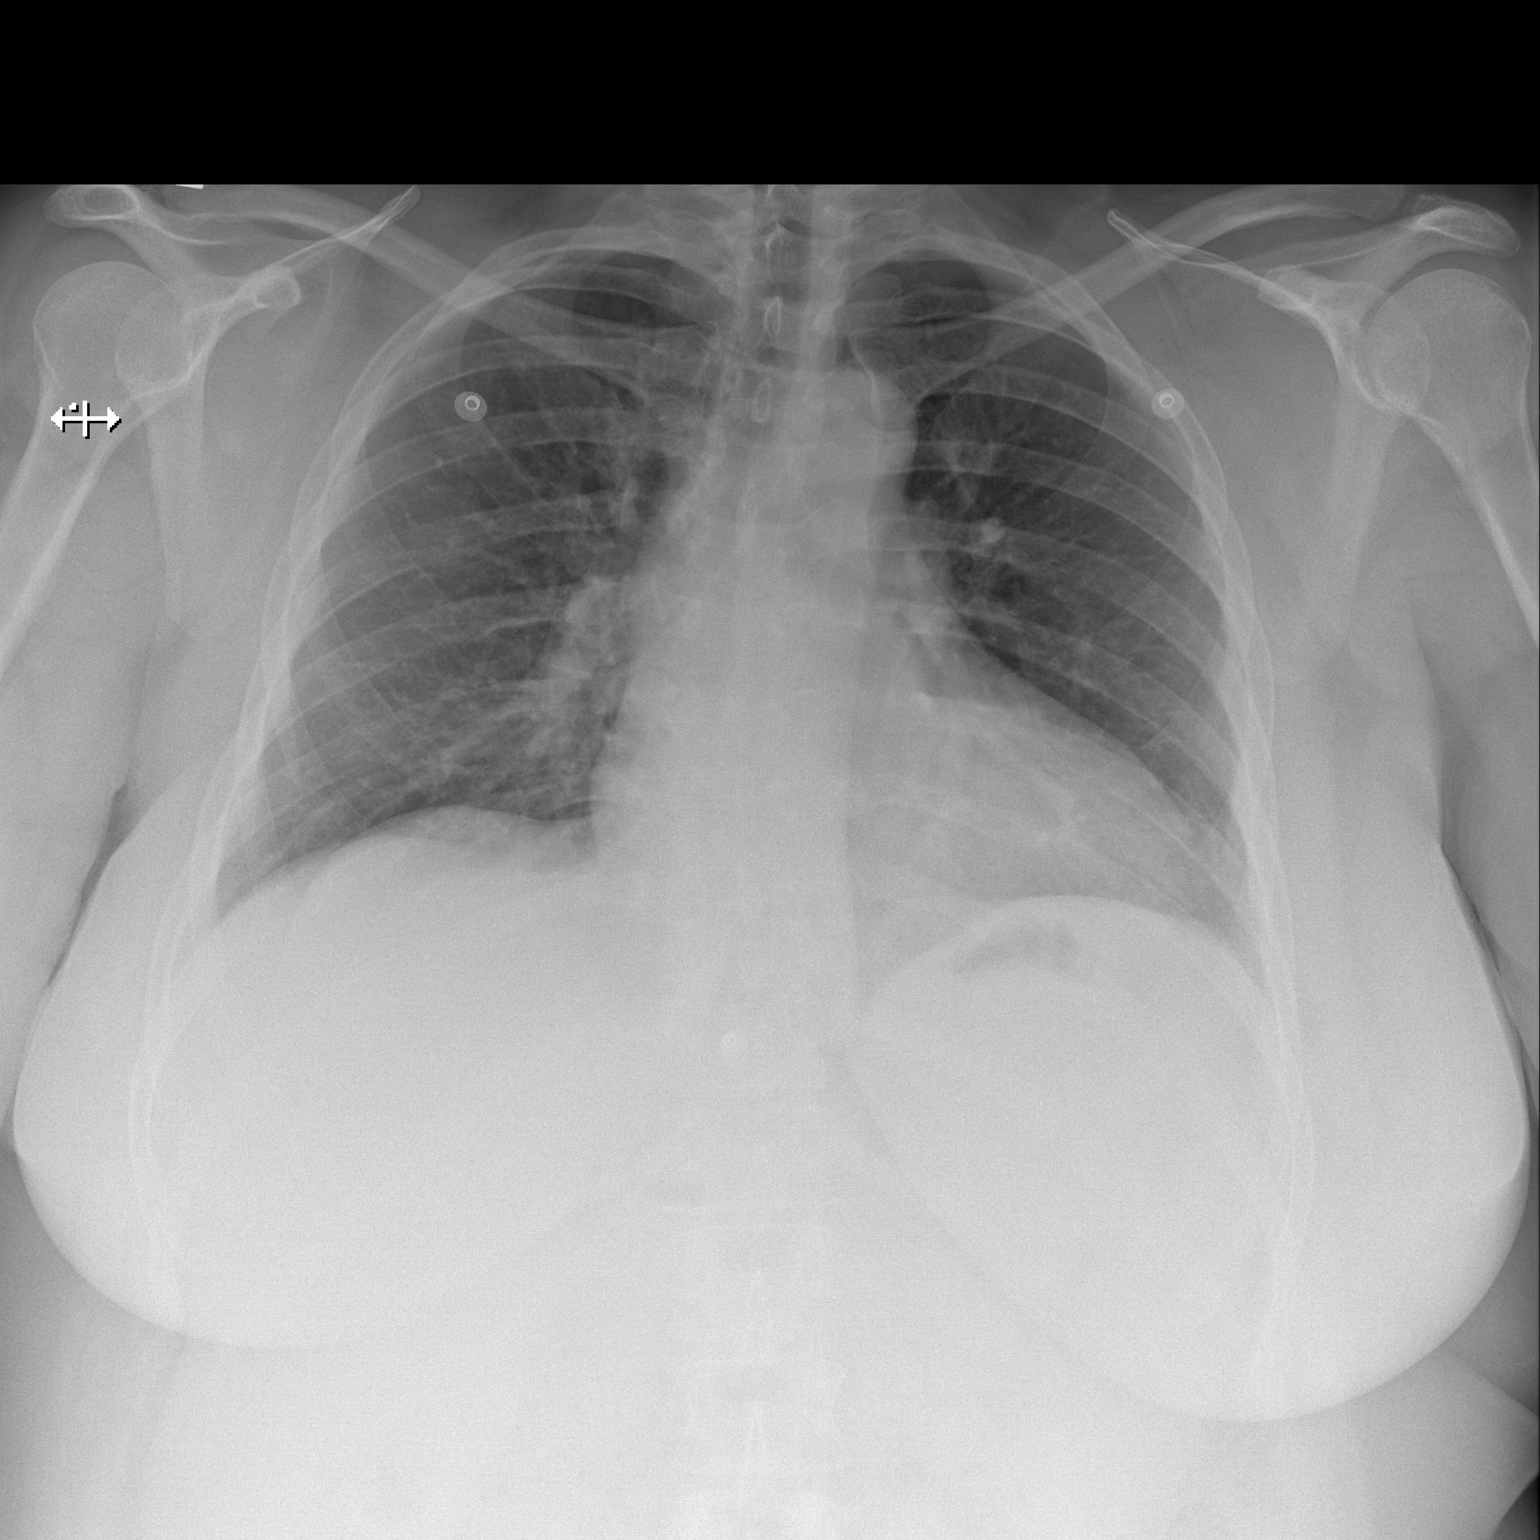
[im 2/2]
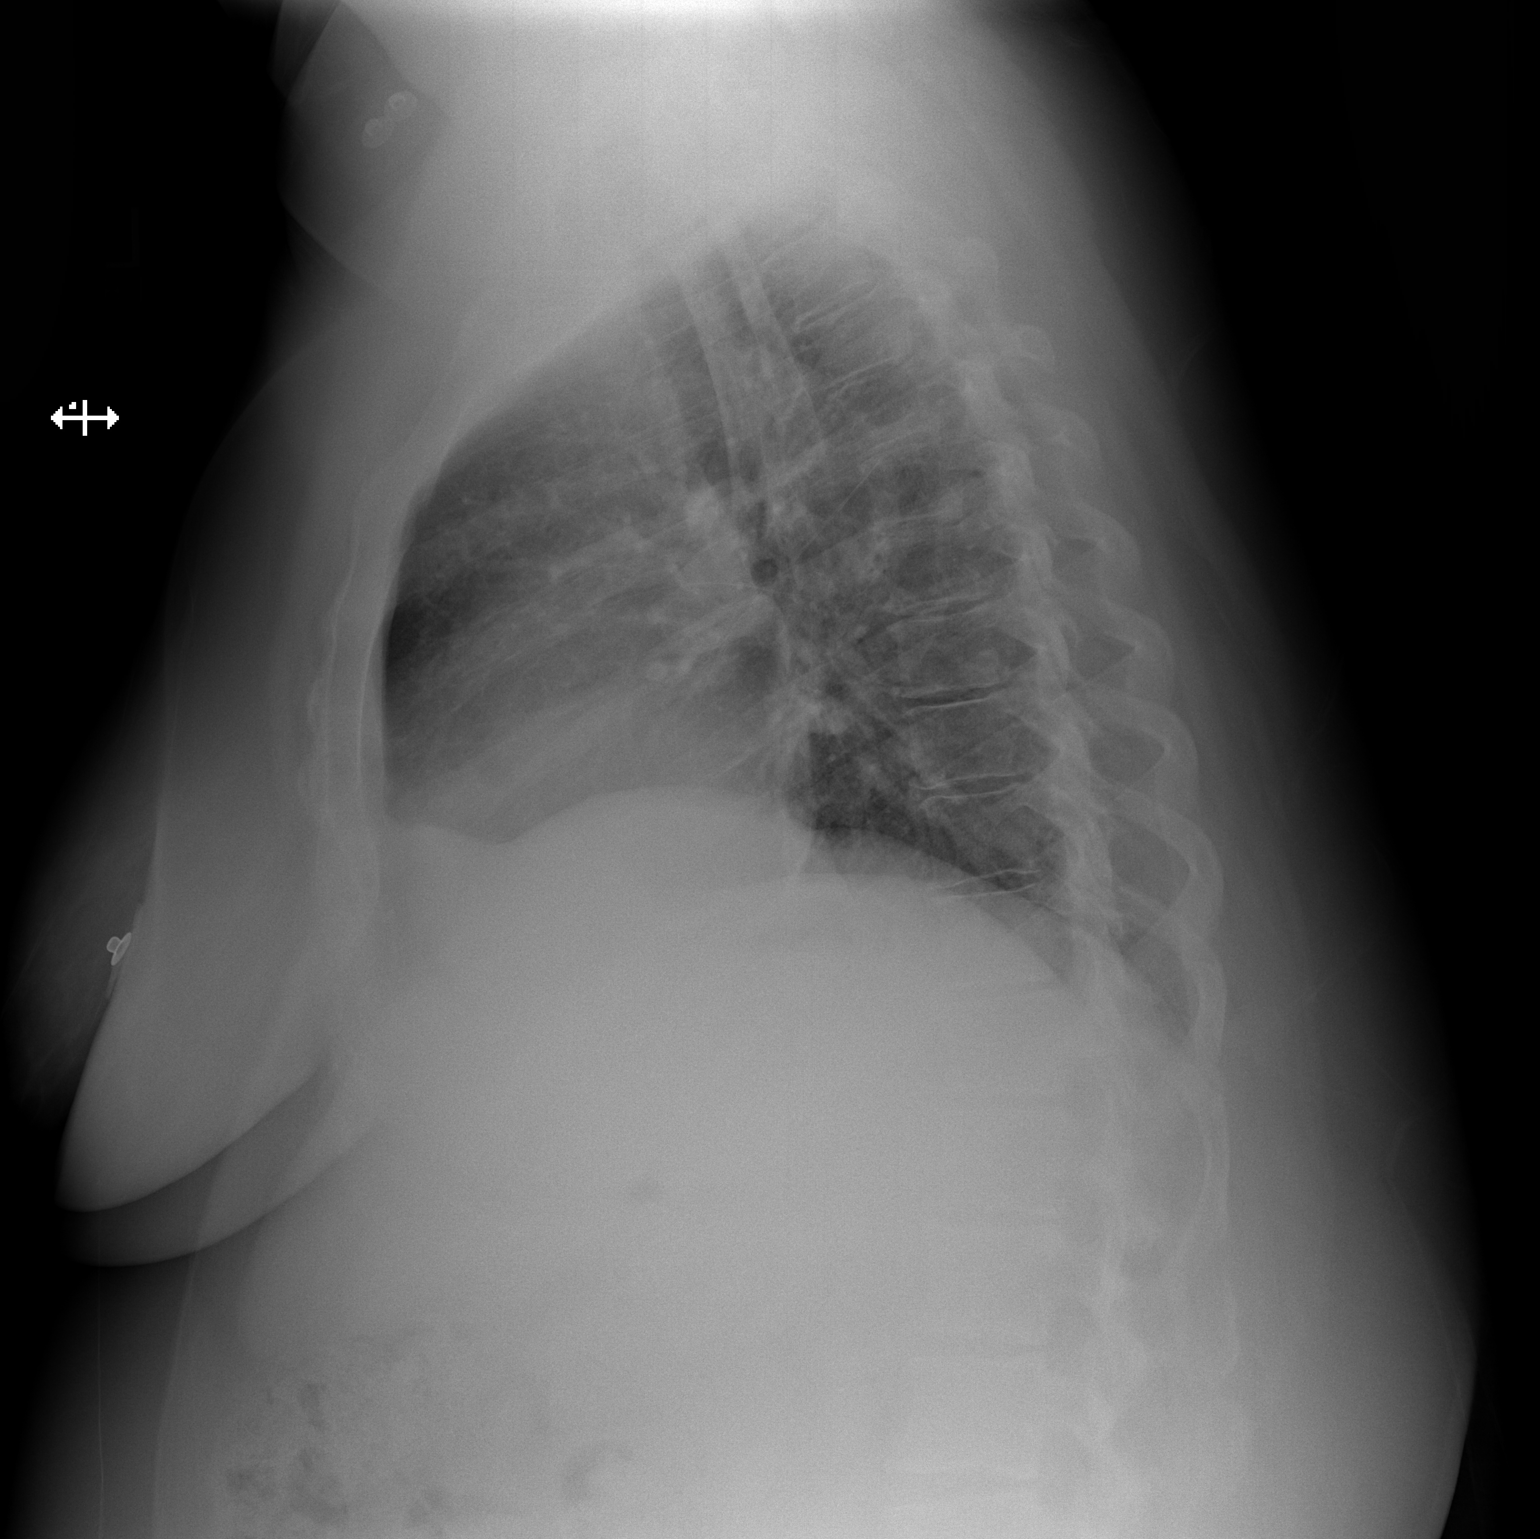

[2 of 2 positions shown; findings below may reference images not displayed]

FINDINGS: Lungs are clear. No pleural effusion or pneumothorax.

The heart is normal in size.

Mild degenerative changes of the visualized thoracolumbar spine.
IMPRESSION: No evidence of acute cardiopulmonary disease.

## 2014-04-25 IMAGING — CR DG CHEST 2V
1 series · 2 of 2 positions shown · non-contrast
Comparison: Chest radiograph performed 11/11/2012

CLINICAL DATA: Chest pain and back pain.

EXAM:
CHEST  2 VIEW

[Series 2: w chest pa · 0.14mm/px · 2 of 2 slices shown]
[im 1/2]
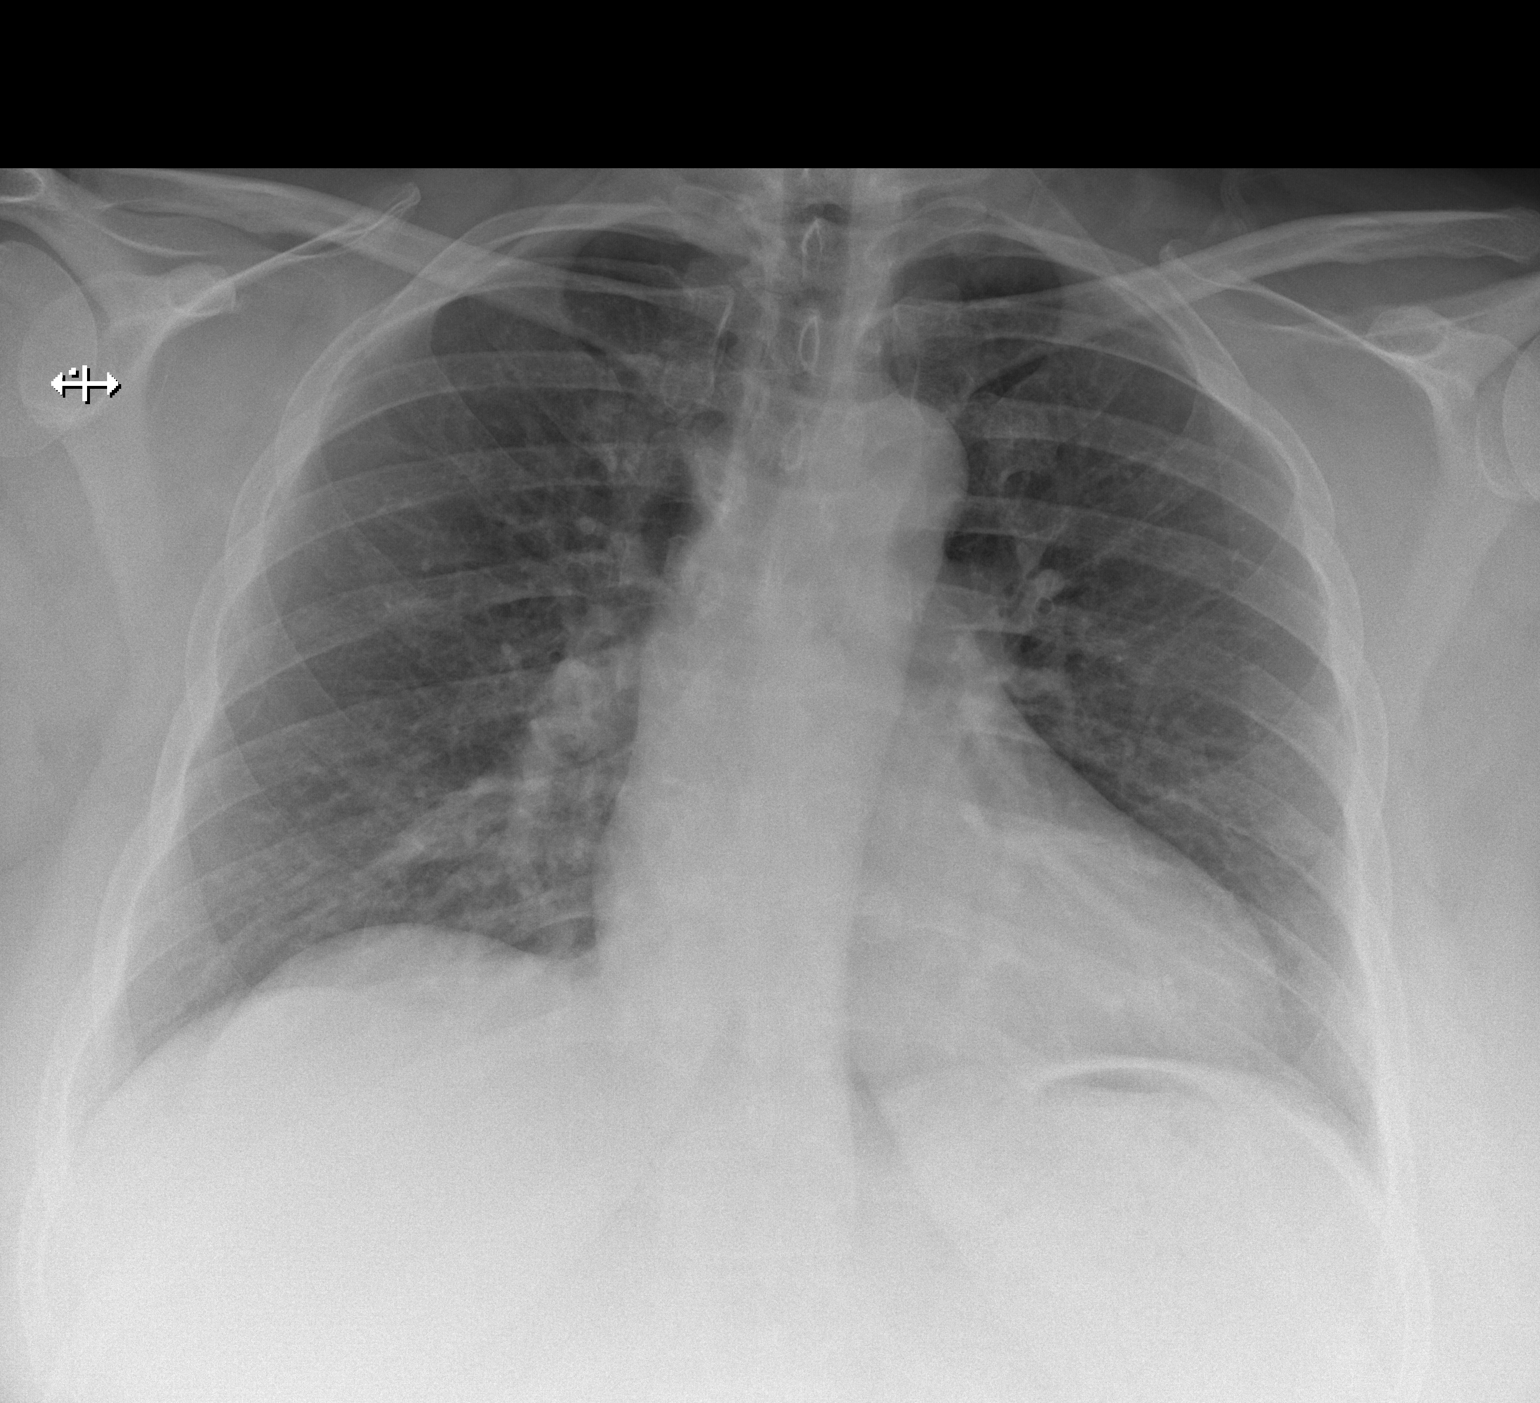
[im 2/2]
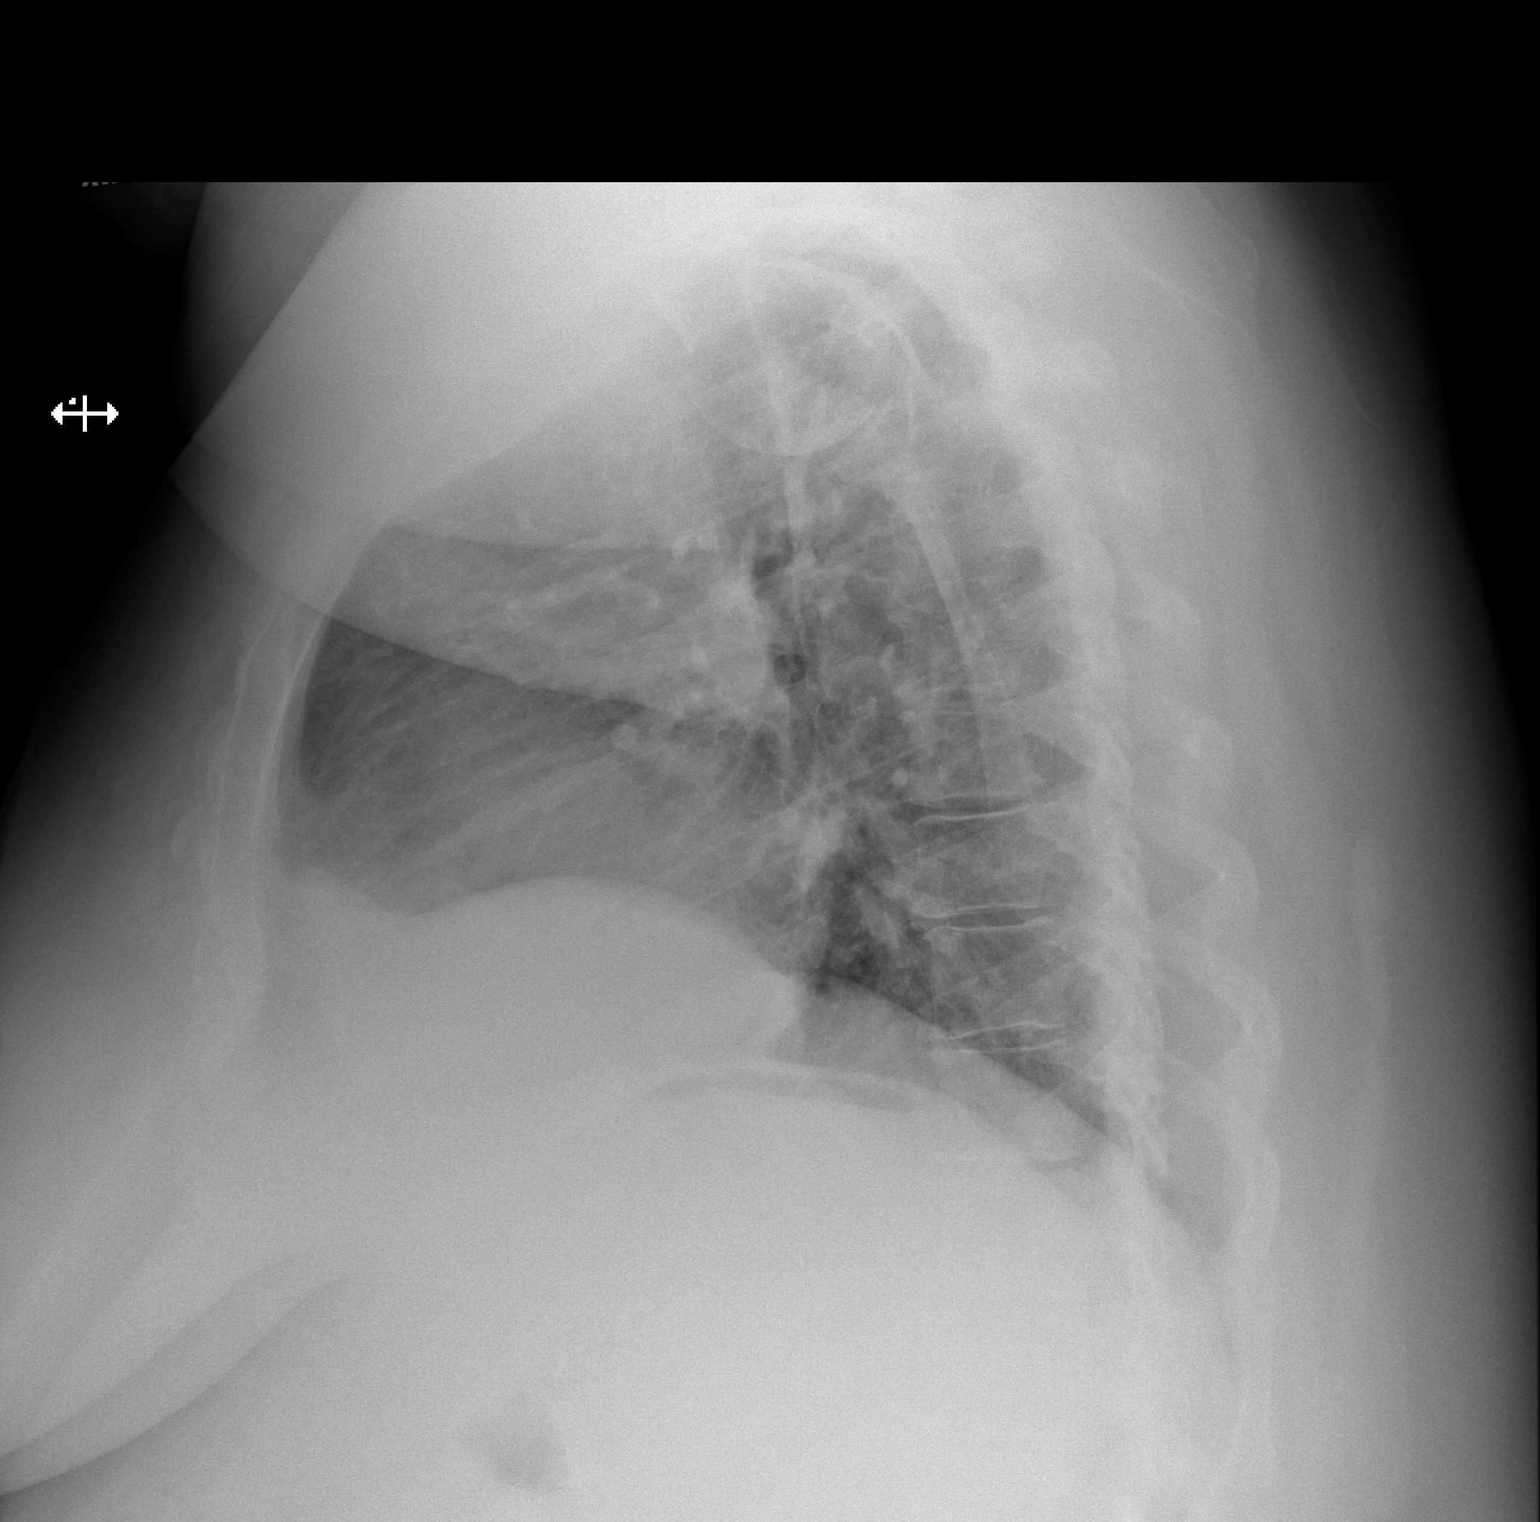

[2 of 2 positions shown; findings below may reference images not displayed]

FINDINGS: The lungs are well-aerated. Mild vascular congestion is noted,
without definite pulmonary edema. There is no evidence of focal
opacification, pleural effusion or pneumothorax.

The heart is normal in size; the mediastinal contour is within
normal limits. No acute osseous abnormalities are seen.
IMPRESSION: Mild vascular congestion noted; no acute cardiopulmonary process
seen.
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
## 2014-04-26 NOTE — Consult Note (Signed)
General Aspect Primary Cardiologist:  New _____________  49 y/o female with a h/o O2 dependent OHS/COPD, who was admitted yesterday with altered mental status and hypoxia and has been found to have an elevated troponin. ____________  Past Medical History 1.  OHS/OSA      a. Home O2 initated 04/2013      b. S/P recent pulmonary eval and sleep study  2.  COPD 3.  Morbid Obesity 4.  DM II (recent Dx) 5.  Chronic low back and buttock pain in setting of Coccyx fracture      a. On chronic narcotics - previously on as much as oxycodone 31m 6x/day - currently on 224mbid. 6.  Anxiety 7.  Depression 8.  GERD 9.  Migraine Headaches 10.  R Arm Pain 11.  Chest Pain      a. s/p [facility] testing [facility] - results unknown. 12.  Hypothyroidism 13.  S/P Hysterectomy _____________   Present Illness [age]/o female with the above complex problem list.   Per pt and husband, she has been on a steady decline from a health standpoint, since her son died in a MVA 3 yrs ago.  She has had progressive DOE as well as dyspnea @ rest, sometimes associated with substernal chest heaviness.  [facility] in the setting of dyspnea and hypoxia and was placed on home O2 @ discharge.  She was seen back in the ED in May 2/2 chronic pain and was noted to be somnolent with CO2 narcosis.  Recommendation was made @ that time that she f/u with pulmonology, which she did.  She has been wearing O2 @ 2lpm @ home, though she often takes it off 2/2 discomfort r/t the nasal cannula.  Last week, she underwent sleep testing, stress testing, and an echo @ [facility].  She is not aware of any of the results yet but was preliminarily told that she had 9 apneic episodes.  She lives locally with her husband and is very sedentary.  She has chronic lower back and buttock pain and uses oxycodone 2049mid.  Her husband has noted some degree of somnolence and alteration of mental status over the past few months.   Early on the morning of 8/24, she got out of bed to use the bathroom and then returned to bed.  She did not put her O2 back on.  Upon her return to bed, her husband then got up to use the bathroom.  When he returned to bed, he found her to be slumped over the edge of the bed and unresponsive, but breathing.  He put her O2 back on and checked her pulse ox, finding her O2 sat to be 40% with a HR in the 140's.  He put her O2 back on and called EMS.  On the EMS operator's advise, he turned her O2 up to 4 lpm.  She remained unresponsive.  EMS arrived @ the house within ~ 10 mins and pt became responsive @ that point.  She was taken to the ED where CXR showed no acute findings.  CTA of the chest was neg for PE with finding of RUL/RML changes.  Troponin was found to be mildly elevated @ 0.36.  ECG was non-acute.  She was admitted for further evaluation.  Troponins have remained elevated with a flat trend.  She is currently chest pain free.  She remains groggy and dozes off frequently during interview.   Physical  Exam:  GEN well developed, well nourished, Pleasant, groggy, NAD.   HEENT pink conjunctivae, moist oral mucosa   NECK supple  obese, no bruits.  Difficult to assess JVP 2/2 girth.   RESP normal resp effort  Diminished breath sounds bilaterally.   CARD Regular rate and rhythm  Normal, S1, S2  No murmur   ABD denies tenderness  soft  normal BS  Obese   LYMPH negative neck   EXTR negative cyanosis/clubbing, negative edema   SKIN normal to palpation   NEURO cranial nerves intact, motor/sensory function intact   PSYCH alert, A+O to time, place, person, lethargic   Review of Systems:  Subjective/Chief Complaint "Tired all the time"   General: Chronic fatigue and sleepiness.  Chronic LBP.   Skin: No Complaints   ENT: No Complaints   Eyes: No Complaints   Neck: No Complaints   Respiratory: Chronic DOE/dyspnea @ rest.   Cardiovascular: Occasional chest heaviness.    Gastrointestinal: Constipation   Genitourinary: No Complaints   Vascular: No Complaints   Musculoskeletal: LBP   Neurologic: No Complaints   Hematologic: No Complaints   Endocrine: No Complaints   Psychiatric: Depression  Anxiety   Review of Systems: All other systems were reviewed and found to be negative   Medications/Allergies Reviewed Medications/Allergies reviewed   Family & Social History:  Family and Social History:  Family History Negative  Neg for premature CAD.   Social History negative tobacco, negative ETOH, Previously smoked marijuana - "many yrs ago."   + Tobacco Prior (greater than 1 year)    Place of Living Home  Lives locally with husband.  Does not work.       Diverticulosis:    Fibromyalgia:    kidney issues:    Depression:    Anxiety:    PTSD:    Hypertension:    Migraines:    Hypothyroidism:    GERD - Esophageal Reflux:    Chronic Back Pain:    Hysterectomy:    Tubal ligation:          Admit Diagnosis:   ACUTE RESPIRATORY FAILURE: Onset Date: 27-Aug-2013, Status: Active, Description: ACUTE RESPIRATORY FAILURE  Home Medications: Medication Instructions Status  acetaminophen 325 mg oral tablet 2 tab(s) orally every 4 hours, As needed, pain or temp. greater than 100.4 Active  citalopram 20 mg oral tablet 1 tab(s) orally once a day Active  alprazolam 2 mg oral tablet 1 tab(s) orally 3 times a day Active  fluticasone nasal 50 mcg/inh nasal spray 2 spray(s) nasal 2 times a day, As Needed Active  clotrimazole 10 mg oral lozenge 1 lozenge orally 4 times a day Active  hydrochlorothiazide 25 mg oral tablet 1 tab(s) orally once a day Active  metFORMIN 500 mg oral tablet 1 tab(s) orally once a day for 7 days, then twice a day (start 8/25) Active  furosemide 40 mg oral tablet 1 tab(s) orally once a day Active  zolpidem 10 mg oral tablet 1 tab(s) orally once a day (at bedtime) Active  omeprazole 40 mg oral delayed release capsule 1  cap(s) orally 1 to 2 times a day, As Needed Active  rizatriptan 10 mg oral tablet 1 tab(s) orally with onset of headache, may repeat in 2 hours, max 110m per day Active  oxyCODONE 20 mg oral tablet 1 tab(s) orally 2 times a day (with meals) Active  Combivent CFC free 100 mcg-20 mcg/inh inhalation aerosol 2 puff(s) inhaled every 4 hours, As Needed - for Shortness of Breath,  for Wheezing  Active  Ventolin CFC free 90 mcg/inh inhalation aerosol 2 puff(s) inhaled every 4 to 6 hours as needed for shortness of breath/wheezing  Active   Lab Results:  Lab:  24-Aug-15 14:40   pH (ABG) 7.380 (7.350-7.450 NOTE: New Reference Range 07/27/13)  PCO2  60 (32-48 NOTE: New Reference Range 08/13/13)  PO2  79 (83-108 NOTE: New Reference Range 07/27/13)  FiO2 30  Base Excess  8 (-3-3 NOTE: New Reference Range 08/13/13)  HCO3  35.5 (21.0-28.0 NOTE: New Reference Range 07/27/13)  O2 Saturation 97.8  O2 Device [place]  Specimen Site (ABG) LT RADIAL  Specimen Type (ABG) ARTERIAL (Result(s) reported on 26 Aug 2013 at 02:49PM.)  Lactic Acid, Arterial, Cardiopulmonary  2.5 (0.3-0.8 NOTE: New Reference Range 08/13/13)  Routine Chem:  25-Aug-15 04:05   Glucose, Serum  156  BUN 11  Creatinine (comp) 0.99  Sodium, Serum 141  Potassium, Serum  2.8  Chloride, Serum  95  CO2, Serum  36  Calcium (Total), Serum  8.3  Anion Gap 10  Osmolality (calc) 284  eGFR (African American) >60  eGFR (Non-African American) >60 (eGFR values <82m/min/1.73 m2 may be an indication of chronic kidney disease (CKD). Calculated eGFR is useful in patients with stable renal function. The eGFR calculation will not be reliable in acutely ill patients when serum creatinine is changing rapidly. It is not useful in  patients on dialysis. The eGFR calculation may not be applicable to patients at the low and high extremes of body sizes, pregnant women, and vegetarians.)  Urine Drugs:  [id number]-37216:[id number]  Tricyclic Antidepressant, Ur Qual  (comp) NEGATIVE (Result(s) reported on 26 Aug 2013 at 09:11PM.)  Amphetamines, Urine Qual. NEGATIVE  MDMA, Urine Qual. NEGATIVE  Cocaine Metabolite, Urine Qual. NEGATIVE  Opiate, Urine qual POSITIVE  Phencyclidine, Urine Qual. NEGATIVE  Cannabinoid, Urine Qual. NEGATIVE  Barbiturates, Urine Qual. NEGATIVE  Benzodiazepine, Urine Qual. POSITIVE (----------------- The URINE DRUG SCREEN provides only a preliminary, unconfirmed analytical test result and should not be used for non-medical  purposes.  Clinical consideration and professional judgment should be  applied to any positive drug screen result due to possible interfering substances.  A more specific alternate chemical method must be used in order to obtain a confirmed analytical result.  Gas chromatography/mass spectrometry (GC/MS) is the preferred confirmatory method.)  Methadone, Urine Qual. NEGATIVE  Cardiac:  24-Aug-15 11:50   CPK-MB, Serum 1.6 (Result(s) reported on 26 Aug 2013 at 01:31PM.)  Troponin I  0.36 (0.00-0.05 0.05 ng/mL or less: NEGATIVE  Repeat testing in 3-6 hrs  if clinically indicated. >0.05 ng/mL: POTENTIAL  MYOCARDIAL INJURY. Repeat  testing in 3-6 hrs if  clinically indicated. NOTE: An increase or decrease  of 30% or more on serial  testing suggests a  clinically important change)    16:35   CPK-MB, Serum -  Troponin I  0.37 (0.00-0.05 0.05 ng/mL or less: NEGATIVE  Repeat testing in 3-6 hrs  if clinically indicated. >0.05 ng/mL: POTENTIAL  MYOCARDIAL INJURY. Repeat  testing in 3-6 hrs if  clinically indicated. NOTE: An increase or decrease  of 30% or more on serial  testing suggests a  clinically important change)    20:14   CK, Total  1282 (26-192 NOTE: NEW REFERENCE RANGE  02/04/2013)  CPK-MB, Serum  8.6 (Result(s) reported on 26 Aug 2013 at 09:26PM.)  Troponin I  0.37 (0.00-0.05 0.05 ng/mL or less: NEGATIVE  Repeat testing in 3-6 hrs  if clinically indicated. >0.05 ng/mL: POTENTIAL  MYOCARDIAL INJURY. Repeat  testing in 3-6 hrs if  clinically indicated. NOTE: An increase or decrease  of 30% or more on serial  testing suggests a  clinically important change)  Routine Hem:  25-Aug-15 04:05   WBC (CBC) 6.2  RBC (CBC) 4.04  Hemoglobin (CBC) 13.1  Hematocrit (CBC) 39.1  Platelet Count (CBC)  138  MCV 97  MCH 32.4  MCHC 33.4  RDW 12.9  Neutrophil % 66.6  Lymphocyte % 25.6  Monocyte % 6.1  Eosinophil % 1.3  Basophil % 0.4  Neutrophil # 4.1  Lymphocyte # 1.6  Monocyte # 0.4  Eosinophil # 0.1  Basophil # 0.0 (Result(s) reported on 27 Aug 2013 at 05:04AM.)   EKG:  EKG Interp. by me   Interpretation EKG shows Sinus Tachycardia, 114, no acute st/t changes  - ? old inferior infarct.   Radiology Results: XRay:    24-Aug-15 13:31, Chest PA and Lateral  Chest PA and Lateral   REASON FOR EXAM:    shortness of breath  COMMENTS:       PROCEDURE: DXR - DXR CHEST PA (OR AP) AND LATERAL  - Aug 26 2013  1:31PM     CLINICAL DATA:  Shortness of breath, history of asthma and COPD    EXAM:  CHEST  2 VIEW    COMPARISON:  05/07/2013    FINDINGS:  Limited inspiratory effect. Mild cardiac enlargement similar to  prior study allowing for this. Vascular pattern normal. Lungs clear.  No effusions.     IMPRESSION:  No acute findings.  Stable mild cardiac enlargement.      ElectronicallySigned    By: Skipper Cliche M.D.    On: 08/26/2013 13:49         Verified By: Rachael Fee, M.D.,    Vicodin: Unknown  Vital Signs/Nurse's Notes:  **Vital Signs.:   25-Aug-15 07:48  Vital Signs Type Routine  Temperature Temperature (F) 97.8  Celsius 36.5  Pulse Pulse 78  Systolic BP Systolic BP 95  Diastolic BP (mmHg) Diastolic BP (mmHg) 64  Mean BP 74  Pulse Ox % Pulse Ox % 90  Oxygen Delivery Room Air/ 21 %    08:07  Pulse Ox % Pulse Ox % 94    Impression 1.  Elevated troponin: Pt presented yesterday with hypersomnolence and probable CO2 narcosis in the  setting of O2 noncompliance on a baseline of OHS/OSA/COPD. O2 sats 2 home recorded in the 40's. Troponins elevated @ 0.36->0.37->0.37. Flat trend non-specific - possible demand ischemia in the setting of prolonged hypoxia. She recently had an echo and stress test @ [facility] and obtaining those records will be crucial. --If stress test abnl, would need R and L heart cath.  2.  Acute on chronic hypercarbic respiratory failure: In setting of OHS/OSA/COPD and O2 noncompliance. --Recently had sleep study - likely needs CPAP/bipap while napping and sleeping at night  3.  Chronic Pain: H/o coccyx fx with chronic low back and buttock/sacral pain. She takes oxycodone 70m BID and says that she thinks she needs it TID. --Narcotic usage likely contributing to somnolence and hypoxia.  4.  Anxiety/Depression:   Home meds per IM.  5.  Morbid Obesity:   Likely the root of many of her problems. --Would benefit from nutrition counseling.  Poor insight.   Electronic Signatures: BRogelia Mire(NP)  (Signed 25-Aug-15 10:47)  Authored: General Aspect/Present Illness, History and Physical Exam, Review of System, Family & Social History, Home Medications, Labs, EKG ,  Radiology, Allergies, Vital Signs/Nurse's Notes, Impression/Plan Ida Rogue (MD)  (Signed 25-Aug-15 17:53)  Authored: General Aspect/Present Illness, History and Physical Exam, Review of System, Family & Social History, Past Medical History, Health Issues, Labs, EKG , Vital Signs/Nurse's Notes, Impression/Plan  Co-Signer: General Aspect/Present Illness, Home Medications, Allergies   Last Updated: 25-Aug-15 17:53 by Ida Rogue (MD)

## 2014-04-26 NOTE — H&P (Signed)
PATIENT NAME:  Lori Mckenzie, Lori Mckenzie MR#:  [id number] DATE OF BIRTH:  November 02, 1965  DATE OF ADMISSION:  08/26/2013  ADDENDUM:    Patient's EKG reveals sinus tachycardia at 114 beats a minute, normal axis, inferior infarcts, age undetermined, with T depressions in lead III and poor R wave progression in V3, as compared to prior EKG's done in our hospital, most recent EKG was performed on the 04/26/2013. No significant change was seen from prior EKG.   RADIOLOGIC STUDIES: Chest x-ray, PA and lateral, on 08/26/2013, revealed no acute findings, stable, mild cardiac enlargement; CTA of chest with IV contrast to rule out pulmonary embolism, 08/26/2013, showed no evidence of pulmonary embolus.  Peripheral reticulonodular interstitial disease with a tree bud appearance involving the right upper lobe and right middle lobe with patchy ground glass opacities in the right upper lobe; the overall appearance can be seen with infectious and inflammatory etiology including atypical infection such as MAI , according to radiology.   TIME SPENT: Additional 5 minutes.     ____________________________ Theodoro Grist, MD rv:nt D: 08/26/2013 17:23:39 ET T: 08/26/2013 20:00:27 ET JOB#: [id number]  cc: Theodoro Grist, MD, <Dictator> Kyley Laurel MD ELECTRONICALLY SIGNED 09/30/2013 21:21

## 2014-04-26 NOTE — Discharge Summary (Signed)
PATIENT NAME:  Lori Mckenzie, Lori Mckenzie MR#:  [id number] DATE OF BIRTH:  [birth year]  DATE OF ADMISSION:  04/26/2013 DATE OF DISCHARGE:  04/29/2013  ADMITTING DIAGNOSIS: Shortness of breath.   DISCHARGE DIAGNOSES: 1. Shortness of breath due to acute on chronic obstructive pulmonary disease exacerbation. The patient hypoxic with ambulation. Will be discharged on home oxygen.  2. 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 The patient's Doppler for deep vein thrombosis was negative. Will need outpatient follow-up with primary M.D.  4. Hyperglycemia due to steroids with a hemoglobin of 6.4. Needs a dietary discretion.  5. Hypertension.  6. Anxiety and depression.  7. Gastroesophageal reflux disease.   CONSULTANTS: None.   PERTINENT LABS AND EVALUATIONS: Sodium 136, potassium 4.3, chloride 105, bicarbonate 24, BUN 11, creatinine 0.66. Glucose 288. Troponin less than 0.02. WBC 6.4, hemoglobin 14.3. Echocardiogram of the heart showed ejection fraction 55% to 60%, mild concentric left ventricular hypertrophy, limited study. Right upper extremity ultrasound negative for deep vein thrombosis. Chest x-ray no acute cardiopulmonary process.    HOSPITAL COURSE: Please refer to H and P done by the admitting physician. The patient is a 49 year old white female with history of COPD, hypothyroidism, hypertension, GERD presented with acute onset of shortness of breath in three days duration. She denied any cough. However, she did complain of shortness of breath. She was also complaining of significant right upper extremity pain. Due to these symptoms, she was placed under observation and treated for acute COPD exacerbation. She continued to remain short of breath; however, it did improve. She was ambulated and her sats were 88% on room air; therefore, she was arranged to have home oxygen. The patient was having right arm pain.  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 If her symptoms persist she needs to be further evaluated per her primary care provider by a back specialist. At this time, she is stable for discharge.   DISCHARGE MEDICATIONS: Ambien 10 at bedtime p.r.n., citalopram 20 mg 1 tab p.o. daily, alprazolam 2 mg 1 tab p.o. t.i.d., fluticasone 50 mcg one spray daily, omeprazole 40 mg 1 tab p.o. b.i.d., rizatriptan 10 mg p.r.n. for headaches, Lasix 40 daily, oxycodone 20 mg 1 tab p.o. b.i.d., Combivent 2 puffs q.4 p.r.n., Spironolactone 50 daily, oxybutynin 5 daily, gabapentin 100 mg 1 tab p.o. t.i.d., Proventil 2 puffs q.4 to 6 p.r.n., Ventolin 2 puffs every 4 to 6 hours as needed, albuterol ipratropium 4 times a day as needed, Tylenol 650 q.4 p.r.n., azithromycin 500 mg 1 tab p.o. q.24 hours x 4 days, prednisone taper 60 mg, taper by 10 until complete. Home oxygen: 2 liters by nasal cannula continuous.   DIET: Low-sodium, low-fat, low-cholesterol, carbohydrate control.   ACTIVITY: As tolerated.   FOLLOW-UP: Primary M.D. in 1 to 2 weeks.    16mn spent ____________________________  SLafonda Mosses PPosey Pronto MD shp:sg D: 04/30/2013 08:42:35 ET T: 04/30/2013 09:56:52 ET JOB#: [id number] cc: Deveron Shamoon H. PPosey Pronto MD, <Dictator>  SAlric SetonMD ELECTRONICALLY SIGNED 05/03/2013 14:03

## 2014-04-26 NOTE — Discharge Summary (Signed)
PATIENT NAME:  Lori Mckenzie, Lori Mckenzie MR#:  [id number] DATE OF BIRTH:  03/19/1965  DATE OF ADMISSION:  11/16/2013 DATE OF DISCHARGE:  11/17/2013  ADMITTING PHYSICIAN: Norva Riffle. Marcille Blanco, MD.  DISCHARGING PHYSICIAN: Gladstone Lighter, MD.  PRIMARY PHYSICIAN: At [facility].  DISCHARGE DIAGNOSES:  1. Mild asthma exacerbation.  2. Muscular abdominal pain.  3. Chronic pain syndrome.  4. Obstructive sleep apnea.  5. Narcotic dependence.  6. Osteopenia.  7. History of uterine cancer. 8. Diabetes mellitus.  9. Hypertension.  10. Obesity.  DISCHARGE MEDICATIONS: 1. Celexa 20 mg p.o. daily.  2. Omeprazole 40 mg p.o. twice a day.  3. Rizatriptan 10 mg orally with onset of headache and repeating every 2 hours up to 30 mg per day.  4. Combivent Respimat 2 puffs 4 times a day.  5. Ventolin inhaler 2 puffs up to 4 to 6 times a day as needed for shortness of breath and wheezing.  6. Tylenol 650 mg q. 4 hours p.r.n. for pain or fever.  7. Flonase 50 mcg inhalation nasal spray 2 sprays to each nostril twice a day as needed.  8. Hydrochlorothiazide 25 mg p.o. daily.  9. Lasix 40 mg a daily.  10. Ambien 10 mg p.o. daily.  11. Prednisone taper over 3 days.  12. Oxycodone 20 mg p.o. b.i.d. 13. Metformin 500 mg p.o. b.i.d.  14. Xanax 1 mg q.8 hours p.r.n. for anxiety.  15. Tussionex 5 mL q. 12 hours p.r.n. for cough. 16. Colace 100 mg p.o. b.i.d.  17. Flexeril 10 mg q. 8 hours p.r.n. for muscle spasm.  18. MiraLax 17 grams powder once a day p.r.n. for constipation.   DISCHARGE DIET: Low-sodium, ADA 1800-calorie diet.   DISCHARGE ACTIVITY: As tolerated.  FOLLOWUP INSTRUCTIONS:  1. PCP followup in one week.  2. Advised to follow up in pain clinic.  LABORATORIES AND IMAGING STUDIES PRIOR TO DISCHARGE: Hemoglobin A1c is 8. LDL cholesterol 58, VLDL 54, triglycerides 269, HDL 90, total cholesterol 131, TSH is low at 0.314. Sodium 140, potassium 3.4, chloride 99, bicarbonate 34, BUN 17,  creatinine 0.94, glucose 206, and calcium of 8.6. CT of the abdomen and pelvis with contrast showing hepatomegaly, fatty infiltration, a 2-cm low attenuation lesion in left lobe of liver, which is stable from previous CT scan in 2013, could be cyst or hemangioma. Chest x-ray showing clear lung fields. No acute cardiopulmonary disease. Moderate amount of stool in the colon. KUB showing moderate amount of stool in colon. No acute cardiopulmonary disease. Urinalysis negative for any infection. ALT 110, AST 118, alkaline phosphatase 118, total bilirubin 0.6, albumin of 3.7, lipase 118.  BRIEF HOSPITAL COURSE: Lori Mckenzie is a 49 year old, obese, Caucasian female with past medical history significant for diabetes, hypertension, chronic pain syndrome, obesity, narcotic dependent, who presents to the hospital with abdominal pain and noted to have mild asthma exacerbation.  1. Acute mild asthma exacerbation. Admitted. Received IV steroids in the Emergency Room. Placed on a prednisone taper and nebulizers in the hospital with significant improvement. No evidence of any bronchitis. Not on any antibiotics. Discharged on cough medicines and prednisone taper from the hospital.  2. Acute on chronic abdominal pain, chest pain syndrome, multiple pain places in the body. She also has fibromyalgia. She was in the Pain Clinic in the past but currently in the process of getting into [facility]. Her PCP at [facility] is only filling her pain medications as needed. Presented to the hospital and states currently has  no pain medication at home. She is on oxycodone twice a day at home, which was continued here, and only 5 days' worth of refills were given. The patient is supposed to follow up with her PCP. She is also placed on Flexeril for her muscle spasm and pain, and she will continue her other home medications. CT of the abdomen was done and completely normal other than fatty infiltration of the liver. She had  moderate stool in the colon as well. Laxatives were adjusted and placed on some while she is on pain medications at the time of discharge.  3. Diabetes, stable on metformin. On prednisone taper so will finish off.  4. Hypertension. The patient on hydrochlorothiazide and Lasix.   Her course has been otherwise uneventful in the hospital.   DISCHARGE CONDITION: Stable.   DISCHARGE DISPOSITION: Home.  TIME SPENT ON DISCHARGE: 40 minutes.     ____________________________ Gladstone Lighter, MD rk:jh D: 11/18/2013 11:54:02 ET T: 11/18/2013 13:13:47 ET JOB#: [id number]  cc: Gladstone Lighter, MD, <Dictator> Gladstone Lighter MD ELECTRONICALLY SIGNED 11/23/2013 15:03

## 2014-04-26 NOTE — H&P (Signed)
PATIENT NAME:  Lori Mckenzie, Lori Mckenzie MR#:  [id number] DATE OF BIRTH:  Jun 17, 1965  DATE OF ADMISSION:  08/26/2013  PRIMARY CARE PHYSICIAN:  ?    PULMONOLOGIST: [facility]     HISTORY OF PRESENT ILLNESS: The patient is a 49 year old Caucasian female with past medical history significant for history of suspected severe sleep apnea, positive obesity hypoventilation syndrome, also history of questionable COPD, new diagnosis of  diabetes mellitus, and recent initiation on metformin presents to the hospital since she was found by her husband to be confused as well as groggy.  Apparently patient was found to be at home; her O2 saturation were 45%, and heart rate was around 120s to 140s per patient's husband.  He increased her O2 delivery to 4 liters and her O2 saturation improved to about just to 90%. She was brought to the Emergency Room for further evaluation where she was noted to have mild elevation of troponin to 0.36. Her chest x-ray was unremarkable; however, patient's CTA revealed pneumonitis in the right side of the lung, questionable pneumonia.   Hospitalist services were contacted for admission.   PAST MEDICAL HISTORY: Significant for history of suspected obesity hypoventilation syndrome, obstructive sleep apnea, work-up is continued by pulmonologist  at [facility]; history of chronic obstructive pulmonary disease, history of diabetes mellitus, and was recently initiated on metformin, history of hypertension, anxiety, depression, gastroesophageal reflux disease, history of right arm pains, hypothyroidism, broken coccyx, back pains, buttock pain when she is sitting on the buttocks, migraine headaches.   MEDICATIONS:  According to medical records, the patient is on:  1. Acetaminophen  325 mg 2 tablets every 4 hours as needed.  2.  Alprazolam 2 mg 3 times daily.  3.  Citalopram 10 mg p.o. daily.  4.  Lisinopril 10 mg 4 times daily.  5.  Combivent 1 puff 4 times daily as needed.  6.  Fluticasone nasal spray  2 sprays twice daily as needed.  7.  Furosemide 40 mg p.o. daily.  8.  Hydrochlorothiazide 25 mg p.o. daily.  9.  Metformin 500 mg p.o. daily, the patient is supposed to start the metformin 500 mg twice daily on 08/27/2013.  10.  Omeprazole 40  mg p.o. 1 to 3 times daily as needed.  11.  Oxycodone 10 mg p.o. twice daily.  12.  Rizatriptan 10 mg p.o. every 2 hours as needed, maximum 30 mg daily.  13.  Ventolin CFC Free 2 puffs every 4 to 6 hours as needed.  14.  Zolpidem 10 mg p.o. at bedtime.   SOCIAL HISTORY: No tobacco, alcohol abuse or drug abuse, previous asbestos exposure.   FAMILY HISTORY: Diabetes mellitus.   ALLERGIES: VICODIN   REVIEW OF SYSTEMS:  CONSTITUTIONAL: Multiple complaints including fatigue and weakness which seem to be   chronic pains and intermittent pains in the chest as well as her right arm, as well as her buttocks, weight gain of approximately 60 pounds over the past 6 months, some blurring of vision, snoring, sleepiness at daytime, even narcolepsy symptoms, napping daytime, intermittent wheezing, shortness of breath; chest pains especially with exertion, last chest pain was approximately 1 to 2 weeks ago, intermittent arrhythmias, dyspnea on exertion, pains in-between her shoulder blades, difficulty passing urine; she was initiated on oxybutynin initially; however, patient's oxybutynin was discontinued by cardiologist and pulmonologist; no fevers, no weight loss. EYES:  Denies any double vision, glaucoma or cataracts.  ENT:  Denies any  hearing loss, discharge, postasal drip .  RESPIRATORY:  Denies  any cough, hemoptysis, asthma, COPD.  CARDIOVASCULAR: Denies orthopnea, edema, arrhythmias, seizures or syncope.  GASTROINTESTINAL: Denies  nausea, vomiting, diarrhea, or constipation.  GENITOURINARY: Denies dysuria, hematuria, frequency or incontinence.   ENDOCRINE: Denies any polydipsia, nocturia, thyroid problems, heat or cold intolerance or thirst.  HEMATOLOGIC:  Denies anemia, easy bruising or bleeding, swollen glands.   SKIN: Denies acne, rash, lesions or change in moles.  MUSCULOSKELETAL: Denies arthritis, cramps, swelling.  NEUROLOGIC: Denies any numbness, epilepsy or tremors.   PSYCHIATRIC: Denies any anxiety, insomnia, or depression.   PHYSICAL EXAMINATION:  VITAL SIGNS: On arrival to the hospital, temperature is 99, pulse was 113, respiration rate was 18; blood pressure 109/65, saturation was 95% on oxygen therapy.  GENERAL: This is a well-developed, well-nourished, obese, Caucasian female somewhat somnolent lying on the stretcher.  HEENT: Her pupils are equal, reactive to light, extraocular movements intact, no icterus or conjunctivitis. There is normal hearing. No pharyngeal erythema. Mucosa is moist.  NECK: No masses, supple, nontender; thyroid is not enlarged, no nuchal rigidity, carotid bruits bilaterally, full range of motion, not able to assess her for JVD  though because of thick and fat neck.  LUNGS: Clear to auscultation in all fields. No significant rales, rhonchi, diminished breath sounds, no wheezing; no labored respirations, increased effort, dullness to percussion or overt  respiratory distress and some diminished sounds were noted in bases.  CARDIOVASCULAR: S1, S2 is appreciated, it is regular. PMI not lateralized. Chest is nontender to palpation, 1+ pedal pulses.  EXTREMITIES: No lower extremity edema, calf tenderness or cyanosis was noted.  ABDOMEN: Protuberant, soft, minimal tenderness bilaterally in right as well as left upper quadrants. No rebound or guarding were noted; no hepatosplenomegaly or masses were noted.  RECTAL: Deferred.  MUSCLE STRENGTH: Able to move all extremities. No cyanosis, degenerative joint disease or kyphosis. The patient was trying to sit up; however, patient does have significant tenderness in the buttock area because of sitting up in coccyx and she is whining and very uncomfortable, weak overall.  SKIN:  Did not reveal any rashes, lesions, or edema, nodularity or induration. It was warm and dry to palpation.  LYMPHATIC: No adenopathy in the cervical region.  NEUROLOGICAL: Cranial nerves grossly intact. Sensory is intact.  No dysarthria or aphasia.   Patient is alert and oriented to time, person and place, cooperative. Memory is somewhat impaired but no significant confusion, agitation or depression was noted.   LABORATORY: Today 08/26/2013, showed a glucose of 202, potassium level of 3.3, otherwise BMP was unremarkable. Calcium level was low at 7.8, troponin 0.36, MB fraction 1.6, patient's white blood cell count was normal at 10.1; hemoglobin was 13.1, platelet count 135,000, coagulation panel was unremarkable; ABGs were performed on 30% FiO2, pH was 7.38, pCO2 of 60, pO2 was 79, saturation was 97.8%, with a lactic acid level of 2.5, bicarbonate level is elevated at 35.5,  bicarbonate level on BNP was 31.   ASSESSMENT AND PLAN: 1.  Acute on chronic respiratory failure, hypoxia with hypercarbic respiratory failure due to pneumonitis, questionable chronic obstructive pulmonary disease exacerbation, suspected aspiration due to gastroesophageal reflux disease, admit patient to medical floor. Start her on Levaquin as well as oxygen therapy.  We will get sputum cultures if possible. We will get pulmonologist involved, work-up is in progress  qualifying her for CPAP versus BiPAP machine by pulmonology at [facility].  2.  Elevated troponin likely demand  ischemia. We will continue patient on low dose of metoprolol, aspirin, nitroglycerin and heparin subcutaneous.  We will check cardiac enzymes x 3. We will get echocardiogram from [facility], and get cardiologist involved. The patient apparently had recent stress test, we will get also stress test results.  4.  Obesity hypoventilation syndrome vs  obstructive sleep apnea. We will continue oxygen therapy for now, keeping oxygen saturations around 90% to 92%.  5.  Confusion likely  CO2 retention.  Seems to be improving now, we will get ABGs if recurrent and place patient on BiPAP if needed.   TIME SPENT:  Was 50 minutes.    ____________________________ Theodoro Grist, MD rv:nt D: 08/26/2013 17:17:13 ET T: 08/26/2013 18:40:40 ET JOB#: [id number]  cc: Theodoro Grist, MD, <Dictator> Silver Achey MD ELECTRONICALLY SIGNED 09/30/2013 21:17

## 2014-04-26 NOTE — H&P (Signed)
PATIENT NAME:  Lori Mckenzie, Lori Mckenzie MR#:  [id number] DATE OF BIRTH:  03-13-1965  DATE OF ADMISSION:  11/16/2013  REFERRING PHYSICIAN:   Delman Kitten MD    PRIMARY CARE PHYSICIAN: Nonlocal.  ADMITTING DIAGNOSIS:  Asthma exacerbation.   HISTORY OF PRESENT ILLNESS: This is a 49 year old Caucasian female who presents to the Emergency Department complaining of left flank pain. Notably, the patient states that she has been coughing very strongly today.  She admits to feeling some shortness of breath and states that she feels a burning sensation in her upper chest. She thinks that cigarette smoke exacerbates her breathing and acknowledges that she lives with people who smoke.  She has a history of  chronic obstructive pulmonary disease or asthma and feels as though she is developing shortness of breath that is consistent with those diagnoses.  While in the Emergency Room, the patient seems to worsen with a brief period of relief following breathing treatments. Prior to calling for admission, the patient still complained of feeling unable to get enough air, which prompted admission for respiratory care.   REVIEW OF SYSTEMS:  CONSTITUTIONAL: The patient denies fever or weakness.  EYES: Denies inflammation or blurred vision.  EARS, NOSE AND THROAT: Denies tinnitus or sore throat.  RESPIRATORY: Admits to cough and shortness of breath.  CARDIOVASCULAR: Denies chest pain, but admits to palpitations. She denies orthopnea or paroxysmal nocturnal dyspnea.  GASTROINTESTINAL: Denies nausea, vomiting or diarrhea, but the patient admits to flank pain that is worse when she coughs.  GENITOURINARY: Denies dysuria, increased frequency or hesitancy.  ENDOCRINE: Denies polyuria or polydipsia.  HEMATOLOGIC AND LYMPHATIC: Denies easy bruising or bleeding.  INTEGUMENTARY: Denies rashes or lesions.  MUSCULOSKELETAL: Denies myalgias or arthralgias.  The patient admits to chronic back pain. NEUROLOGIC: Denies numbness in her  extremities, and denies dysarthria.   PSYCHIATRIC: Denies depression or suicidal ideation.   PAST MEDICAL HISTORY: Chronic obstructive pulmonary disease/asthma, obstructive sleep apnea, chronic back pain, osteopenia due to steroid injections, and history of uterine cancer.   PAST SURGICAL HISTORY: Hysterectomy and bilateral tubal ligation.   SOCIAL HISTORY: The patient rarely drinks. She does not smoke, but she admits to actively trying to reduce the number of prescription pain medicines she takes for her back.   FAMILY HISTORY: The patient's mother is deceased of lung cancer. Her maternal aunt is deceased of breast cancer.   MEDICATIONS:  1.  Acetaminophen 325 mg 2 tablets p.o. every 4 hours as needed for fever.  2.  Alprazolam 1 mg 1 tablet p.o. every 8 hours as needed for anxiety.  3.  Citalopram 20 mg 1 tablet p.o. daily.  4.  Clotrimazole 10 mg lozenge, 1 lozenge 4 times a day.  5.  Combivent 100 mcg/20 mcg inhalation 2 puffs inhaled every 4 hours as needed for coughing, wheezing, or shortness of breath.  6.  Fluticasone nasal spray 50 mcg/inhalation, 2 sprays to each nostril twice a day as needed for rhinitis.  7.  Furosemide 40 mg 1 tablet p.o. daily.  8.  Hydrochlorothiazide 25 mg 1 tab p.o. daily.  9.  Levofloxacin 750 mg 1 tab p.o. daily.  10. Metformin 500 mg 1 tablet p.o. twice a day.  11. Omeprazole 40 mg delayed release capsule 1 capsule p.o. 2 times a day as needed.  12. Oxycodone 20 mg 1 tablet p.o. b.i.d. with meals.  13. Rizatriptan 10 mg 1 tablet p.o. at the onset of headache, may repeat in 2 hours for max of 30 mg  per day.  14. Ventolin 90 mcg/inhalation, 2 puffs inhaled every 4 to 6 hours as needed for shortness of breath or wheezing.  15. Zolpidem 10 mg 1 tablet p.o. at bedtime.   ALLERGIES: VICODIN.   PERTINENT LABORATORY RESULTS AND RADIOGRAPHIC FINDINGS: Serum glucose is 183, BUN is 8, creatinine 0.86, sodium 143, potassium 3.5, chloride 101, bicarbonate 32,  calcium is 7.8. Lipase is 118, alkaline phosphatase 118, AST is 118, ALT is 110. White blood cell count is 8.1, hemoglobin 13.6, hematocrit 39.1, platelet count is 129,000.  Urine is negative for infection. Chest x-ray shows no active cardiopulmonary disease.  A KUB shows no active pulmonary disease, but a moderate amount of stool in the "colon."   PHYSICAL EXAMINATION:  VITAL SIGNS: Temperature is 98.1, pulse 113, respirations 20, blood pressure 121/76, pulse oximetry 91% on 2 liters of oxygen via nasal cannula.  GENERAL: The patient is alert and oriented, in no apparent distress, but she does have trouble finishing sentences some time because of shortness of breath.  HEENT: Normocephalic, atraumatic.  Pupils equal, round, and reactive to light and accommodation. Extraocular movements are intact. Mucous membranes are moist.  NECK: Trachea is midline. No adenopathy.  CHEST: Symmetric and atraumatic.  CARDIOVASCULAR: Tachycardic rate, but normal S1, S2. No rubs, clicks, or murmurs appreciated.  LUNGS: There is wheezing in all lung fields bilaterally. The patient has moderate air movement. She is not using accessory muscles to breathe.  ABDOMEN: Positive bowel sounds. Soft, tender to palpation over the left flank and somewhat diffusely throughout the rest of the abdomen. There is no rebound tenderness, but the patient does voluntarily guard at times. There is no hepatosplenomegaly.  GENITOURINARY: Deferred.  MUSCULOSKELETAL: The patient moves all 4 extremities equally. There is 5/5 strength in upper and lower extremities bilaterally.  SKIN: There are no rashes or lesions.  EXTREMITIES: No clubbing or cyanosis. The patient does have some nonpitting and non-tense edema of her ankles.  NEUROLOGIC: Cranial nerves II through XII are grossly intact.  PSYCHIATRIC: Mood is normal. Affect is congruent.   ASSESSMENT AND PLAN: This is a 49 year old female admitted for an asthma exacerbation.  1.  Asthma  exacerbation, somewhat improved after Solu-Medrol in the Emergency Department. The patient has also received 3 nebulizer treatments and feels somewhat better although her physical examination is not significantly improved and certainly has room to improve prior to discharge.  I have added azithromycin to her regimen, as well as Advair, Spiriva and albuterol treatments every 2 hours as needed for shortness of breath. I will start a steroid taper tomorrow.  2.  Chronic obstructive pulmonary disease. The patient needs spirometry to clarify her pathophysiology. She is in severity category of either asthma or chronic obstructive pulmonary disease that warrants not only inhaled corticosteroids, but also anticholinergic agent as well as a beta agonist.  3.  Obstructive sleep apnea.  The patient wears CPAP at home and will order a nocturnal positive pressure ventilation while she is in the hospital.  4.  Osteopenia.  We will check the patient's vitamin D level.  5.  Morbid obesity.  The patient's body mass index is 51.3.  I have encouraged diet and exercise. We will also continue metformin, which is presumably for appetite suppression as well as metabolic syndrome.  We will check her A1c as well.  6.  Flank pain. This is likely musculoskeletal or due to strain from coughing.  7.  Chronic back pain.  I have started the patient on a  long-acting narcotic and she has some oxycodone with acetaminophen for breakthrough pain as well.  8.  Deep vein thrombosis prophylaxis, on Lovenox.  9.  Gastrointestinal prophylaxis. None.  The patient is a FULL CODE.   TIME SPENT ON ADMISSION ORDERS AND PATIENT CARE:  Approximately 40 minutes.     ____________________________ Norva Riffle. Marcille Blanco, MD msd:DT D: 11/16/2013 09:08:13 ET T: 11/16/2013 09:29:57 ET JOB#: [id number]  cc: Norva Riffle. Marcille Blanco, MD, <Dictator> Norva Riffle Alpha Chouinard MD ELECTRONICALLY SIGNED 11/21/2013 1:21

## 2014-04-26 NOTE — Discharge Summary (Signed)
PATIENT NAME:  Lori Mckenzie, HAMMERSMITH MR#:  [id number] DATE OF BIRTH:  April 01, 1965  DATE OF ADMISSION:  08/26/2013 DATE OF DISCHARGE:  08/28/2013  PRESENTING COMPLAINT: Shortness of breath.   DISCHARGE DIAGNOSES:  1.  Acute respiratory distress due to pneumonitis.  2.  Suspected severe sleep apnea.  3.  Morbid obesity.  4.  Chronic back pain with narcotic dependence.   OXYGEN:  Sats 92% to 94% on 2 liters.  The patient uses chronic home oxygen.   MEDICATIONS:   1.  Citalopram 20 mg daily.  2.  Omeprazole 40 mg daily.  3.  Rizatriptan 10 mg 1 tablet with onset of headaches and may repeat in two hours. 4.  Oxycodone 20 mg b.i.d.  5.  Combivent 2 puffs every 4 hours as needed.  6.  Ventolin 90 mcg per inhalation 2 puffs every 4 to 6 hours as needed.  7.  Acetaminophen/Tylenol 325 mg 2 tablets every 4 hours as needed.  8.  Fluticasone nasal spray 2 sprays b.i.d. as needed. 9.  Clotrimazole 10 mg 1 lozenge orally 4 times a day. 10. Hydrochlorothiazide 25 mg p.o. daily. 11. Metformin 500 mg 1 tablet orally daily for 7 days and then twice a day, start August 25. 12. Furosemide 40 mg daily. 13. Ambien 10 mg at bedtime.  14. Levaquin 750 mg p.o. daily.  15. Alprazolam 1 mg every 8 hours as needed only.   DIET:  Low sodium.   FOLLOWUP:  Followup in 1 to 2 weeks with [facility] pulmonary as early as possible.   OXYGEN:  Use your home oxygen as before.  Hospital course:   CARDIOLOGY CONSULTATION: Dr. Rockey Situ.   BRIEF SUMMARY OF HOSPITAL COURSE:  Ryker Pherigo is a morbidly obese Caucasian female with history of chronic pain with chronic narcotic dependence, who comes to the Emergency Room with:   1.  Acute on chronic respiratory failure suspected due to pneumonitis and underlying sleep apnea. The patient was started on BiPAP here given her elevated CO2 and hypoxia and it did help her some. She uses chronic home oxygen. She is getting a workup for sleep apnea through her pulmonologist at [facility].  The patient  and her husband advised to make an early appointment with [facility] pulmonary and see if they can set up the CPAP as an outpatient to be used at home. Recently, an echo was done at [facility]. The patient will follow up with her pulmonologist/primary care.   2.  Elevated troponin, suspect demand ischemia. Continue metoprolol, aspirin, nitroglycerin, and heparin subcutaneous.  Follow up with cardiology as an outpatient.  3.  Obstructive sleep apnea.  Continue oxygen and the patient is to get CPAP through [facility].  4.  Confusion likely CO2 retention.  The patient's mentation improved. 5.  Chronic back pain with fibromyalgia on a low of pain medications.  The patient advised to back off on some of the pain meds if possible. Hospital stay otherwise remained stable. The patient remained a FULL CODE.  TIME SPENT:  40 minutes    ____________________________ Gus Height A. Posey Pronto, MD sap:nr D: 08/30/2013 16:09:43 ET T: 08/30/2013 22:57:43 ET JOB#: [id number]  cc: Lenford Beddow A. Posey Pronto, MD, <Dictator> Minna Merritts, MD Ilda Basset MD ELECTRONICALLY SIGNED 09/10/2013 14:51

## 2014-04-26 NOTE — H&P (Signed)
PATIENT NAME:  Lori Mckenzie, Lori Mckenzie MR#:  [id number] DATE OF BIRTH:  11-09-1965  DATE OF ADMISSION:  04/26/2013  REFERRING PHYSICIAN: Orlie Dakin, MD  PRIMARY CARE PHYSICIAN: [place]  CHIEF COMPLAINT: Shortness of breath.   This is a 49 year old Caucasian female with history of COPD, hypothyroidism, hypertension, GERD, who presented with shortness of breath. Describes a 3-day duration of shortness of breath, which is at rest, as well as dyspnea on exertion. She denies any cough, however, does complain of orthopnea. Denies any PND or lower extremity edema. Denies chest pain, palpitations, fevers, or further associated symptoms. She, however, does note having right arm pain over her forearm in location without radiation, cramping, intensity 6 out of 10. No worsening or relieving factors. She had preceding rash for about 3 days prior to that, which is apparently vesicular in nature, that is completely gone. No evidence of rash at this time. Currently complaining only of shortness of breath, however, maintaining saturations on room air.   REVIEW OF SYSTEMS:    CONSTITUTIONAL: Denies fever, fatigue, weakness.  EYES: No blurred vision, double vision, eye pain.  EARS, NOSE, THROAT: Denies tinnitus, ear pain, hearing loss.  RESPIRATORY: Positive for wheezing and shortness of breath.  CARDIOVASCULAR: Denies chest pain, palpitations, edema.  GASTROINTESTINAL: Denies nausea, vomiting, diarrhea,  GENITOURINARY: Denies dysuria, hematuria.  ENDOCRINE: Denies nocturia or thyroid problems.  HEMATOLOGIC AND LYMPHATIC: Denies easy bruising, bleeding.  SKIN: Denies rashes, lesions. MUSCULOSKELETAL: Positive for pain in right arm as described above, however, denies any pain in neck, back, shoulder knees, hips, or  arthritic symptoms.  NEUROLOGIC: Denies any paralysis, paresthesias.  PSYCHIATRIC: Denies anxiety or depressive symptoms.   Otherwise, full review of systems performed by me is negative.   PAST MEDICAL  HISTORY: COPD, depression, anxiety, hypothyroidism, hypertension, gastroesophageal reflux disease.   SOCIAL HISTORY: Denies alcohol, tobacco, or drug usage. Has previous asbestos exposure.   FAMILY HISTORY: Positive for diabetes.   ALLERGIES: VICODIN.   HOME MEDICATIONS: Include spironolactone 50 mg p.o. at bedtime, rizatriptan 10 mg p.o. p.r.n. for headache, gabapentin 100 mg p.o. 3 times daily, citalopram 20 mg p.o. daily, alprazolam 2 mg p.o. 3 times a day, Ambien 10 mg p.o. at bedtime, albuterol/ipratropium 2.5/0.5 mg inhalation 4 times daily as needed for shortness of breath, Proventil 90 mcg inhalation 2 puffs every 4 hours as needed for shortness of breath, Lasix 40 mg p.o. daily, Flonase 50 mcg nasal spray daily, Prilosec 40 mg p.o. daily, oxybutynin 5 mg p.o. daily.   PHYSICAL EXAMINATION: VITAL SIGNS: Temperature 98.2, heart rate 104, respirations 18, blood pressure 112/66, saturating 95% on room air. Weight 117.9 kg, BMI 47.6.  GENERAL: Well-nourished, obese Caucasian female, currently in no acute distress.  HEAD: Normocephalic, atraumatic.  EYES: Pupils equal, round, reactive to light. Extraocular muscles intact. No scleral icterus.  MOUTH: Moist mucous membranes. Dentition intact. No abscess noted.  EARS, NOSE, THROAT: Clear without exudates. No external lesions.  NECK: Supple. No thyromegaly. No nodules. No JVD.  PULMONARY: Grossly diminished breath sounds throughout all lung fields with scattered wheezing and prolonged expiratory phase. Good respiratory effort.  CHEST: Nontender to palpation.  CARDIOVASCULAR: S1, S2, regular rate and rhythm. No murmurs, rubs or gallops. No edema. Pedal pulses 2+ bilaterally.  GASTROINTESTINAL: Soft, nontender, nondistended. No masses. Positive bowel sounds. No hepatosplenomegaly.  MUSCULOSKELETAL: No swelling, clubbing, edema. Range of motion full in all extremities.  NEUROLOGIC: Cranial nerves II through XII intact. No gross focal neurological  deficits. Sensation intact. Reflexes intact.  SKIN: No ulcerations, lesions, rash, cyanosis. Skin warm and dry. Turgor intact.  PSYCHIATRIC: Mood and affect within normal limits. The patient is awake, alert and oriented x 3. Insight and judgment intact.   RADIOLOGICAL DATA: No acute cardiopulmonary process. No evidence of asbestosis.  LABORATORY DATA: Sodium 136, potassium 4.3, chloride 105, bicarbonate 24, BUN 11, creatinine 0.66, glucose 288. Troponin I less than 0.02. WBC 6.5, hemoglobin 14.3, platelets 172.   ASSESSMENT AND PLAN: A 49 year old Caucasian female with history of chronic obstructive pulmonary disease and asbestos exposure presenting with shortness of breath.  1.  Chronic obstructive pulmonary disease exacerbation: DuoNeb therapy q.4 hours. Supplemental oxygen to keep oxygen saturation greater than 92%,. Incentive spirometry. Solu-Medrol 60 mg IV daily. No indication for azithromycin at this time, as no cough or sputum production.  2.  Right arm pain: Possible acute neuritis secondary to shingles. No evidence of active shingles at this time. Provide analgesics for pain control.  3.  Hyperglycemia: Insulin sliding scale with q.6 hour Accu-Cheks. Check hemoglobin A1c. 4.  Hypertension: Continue Lasix and spironolactone.  5.  Deep venous thrombosis prophylaxis with heparin subcutaneous.   CODE STATUS: The patient is full code.   TIME SPENT: 45 minutes.    ____________________________ Aaron Mose. Soma Lizak, MD dkh:jcm D: 04/26/2013 20:50:31 ET T: 04/26/2013 22:30:08 ET JOB#: [id number]  cc: Aaron Mose. Seryna Marek, MD, <Dictator> Quyen Cutsforth Woodfin Ganja MD ELECTRONICALLY SIGNED 04/27/2013 1:36

## 2014-04-26 NOTE — Consult Note (Signed)
PATIENT NAME:  Lori Mckenzie, BOEH MR#:  [id number] DATE OF BIRTH:  [birth year]  DATE OF CONSULTATION:  05/07/2013  REFERRING PHYSICIAN:  Dr. Francene Castle CONSULTING PHYSICIAN:  [place] Gamero S. Manuella Ghazi, MD  PRIMARY CARE PHYSICIAN:  Dr. [name].    REASON FOR CONSULTATION:   Increased carbon dioxide level and somnolence.   HISTORY OF PRESENT ILLNESS: The patient is a 49 year old female with a known history of COPD, hypothyroidism, hypertension, GERD, was seen in consultation for increased carbon dioxide with increased somnolence. Per report, the patient was feeding some lower abdominal pain, back and right leg pain, which has been chronic.  Normally, she uses about 2 liters of oxygen at home and was satting about 95% here on 2 liters of oxygen, but while in the triage she was somewhat drowsy and sleepy. In the ED, she got a blood gas, which showed pCO2 of 60, for which ED physician requested evaluation. I requested the ED physician to place her on BiPAP and recheck her blood gas an hour or 2 later,  which shows improvement, indicative of possible CO2 narcosis. On my evaluation, the patient already had 2 mg of IV Ativan and 8 mg of morphine, per ED physician. When I saw her, she was already somewhat more awake and alert and talking, in spite of getting this much morphine and Ativan. Please note, the patient does have chronic pain and anxiety, which is not new. She denies any other symptoms at this time.  PAST MEDICAL HISTORY:  1.  COPD. 2.  Anxiety. 3.  Depression. 4.  Hypothyroidism. 5.  Hypertension. 6.  GERD.   SOCIAL HISTORY:  No alcohol, tobacco or drug use. History of previous asbestos exposure.  FAMILY HISTORY:   Positive for diabetes.  ALLERGIES:  VICODIN.  MEDICATIONS AT HOME:  Per recent discharge summary:   1.  Ambien 10 mg p.o. at bedtime as needed.  2.  Citalopram 20 mg p.o. daily.  3.  Alprazolam 2 mg 1 tablet p.o. t.i.d.  4.  Flonase 50 mcg 1 spray daily.    5.  Omeprazole 40 mg p.o.  b.i.d.  6.  Rizatriptan 10 mg p.o. as needed for headache. 7.  Lasix 40 mg p.o. daily.  8.  Oxycodone 20 mg p.o. b.i.d. 9.  Combivent 2 puffs inhaled every 4 hours as needed.  10.  Spironolactone 50 mg p.o. daily.  11.  Oxybutynin 5 mg a day.  12.  Gabapentin 100 mg p.o. t.i.d.  13.  Proventil 2 puffs every 4 to 6 hours as needed.  14.  Ventolin 2 puffs every 4 to 6 hours as needed.  15.  Albuterol/ipratropium 4 times daily as needed. 16.  Tylenol 650 mg p.o. every 4 hours as needed. 17.  Azithromycin  course has been finished from last discharge.  18.  Prednisone taper has also been finished for 10 days.   REVIEW OF SYSTEMS:  CONSTITUTIONAL:  No fever, fatigue, weakness.  EYES:  No blurred or double vision.  ENT: No tinnitus, ear pain.  RESPIRATORY: No cough, wheezing or hemoptysis. Some minimal shortness of breath, which seemed to be at her baseline. She does have history of COPD.    CARDIOVASCULAR: No chest pain, orthopnea, edema.  GASTROINTESTINAL: No nausea, vomiting, diarrhea. She does have chronic abdominal pain which does not seem to be any different.  GENITOURINARY: No dysuria or hematuria.  ENDOCRINE: No polyuria, nocturia.  HEMATOLOGY:  No anemia or easy bruising.   SKIN: No rash or lesion. MUSCULOSKELETAL: Chronic  pain.  NEUROLOGIC: No tingling, numbness, weakness. She was somewhat more somnolent when she came in. Certainly, she did receive a lot more pain medication and Ativan while here in the ED.  PSYCHIATRY: Positive anxiety and depression.   PHYSICAL EXAMINATION: VITAL SIGNS: Temperature 98, heart rate 88 per minute, respirations 16 per minute, blood pressure 112/66 mmHg. She was saturating 96% on 2 liters oxygen via nasal cannula, which is her baseline.  GENERAL:  The patient is morbidly obese with a BMI of 47.6, lying comfortably in the bed without any acute distress. She does have BiPAP applied.  EYES: Pupils equal, round and reactive to light and accommodation.  No scleral icterus. Extraocular muscles intact.  HENT: Head atraumatic, normocephalic. Oropharynx and nasopharynx clear.  NECK: Supple. No jugular venous distention.  No thyroid enlargement or tenderness. LUNGS: Clear to auscultation bilaterally. No wheezing, rales, rhonchi or crepitation.  CARDIOVASCULAR: S1, S2 normal. No murmurs, rubs.  ABDOMEN:  Soft, obese, nontender, nondistended. Bowel sounds present. No organomegaly or mass.  EXTREMITIES: No pedal edema, cyanosis or clubbing.  NEUROLOGIC: Cranial nerves III through XII.  Muscle strength 5/5 in extremities.  sensations intact. PSYCHIATRIC: The patient is alert and oriented x 3. SKIN: No obvious rash, lesion or ulcer.  MUSCULOSKELETAL: No joint effusion or tenderness.   LABORATORY DATA: Normal CBC except platelet count of 136. Normal first set of troponins. Normal BMP except sodium 134. Normal liver function tests, except ALT of 129, AST of 96. UA was negative. ABG showed pH of 7.32, pCO2 of 60, bicarb of 30.9 and oxygen saturation 97.8  on presentation to ED. Subsequent repeat ABG after placing her on BiPAP, 2 hours later, showed pH of 7.39, pCO2 of 54, pO2 of 68, bicarb of 32.7, oxygen saturation 94.6.   Chest x-ray in the ED showed no acute cardiopulmonary disease.   IMPRESSION AND PLAN: 1.  Carbon dioxide narcosis making her somewhat hypersomnolent. In addition, she certainly could have taken a lot more pain medication. She does have underlying obesity hypoventilation syndrome, along with COPD, which certainly can make this worse, and here she already received Ativan and significant amount of morphine, which can make this worse. At this time, I do not see any indication to admit her. This was discussed with ED physician, Dr. Francene Castle, who agrees with the same, as she did show significant improvement with BiPAP and she will need further evaluation by pulmonary as an outpatient and likely BiPAP/CPAP for home use. I also requested that  she do not use high dose of oxygen via nasal cannula, as this can make her carbon dioxide level worse. Her oxygen saturation above 93% should be good enough considering her long-term COPD and obesity hypoventilation syndrome.  2.  Chronic pain. She can continue using her home medication, but again, she was cautioned to use this very judiciously, as this can make her breathing and CO2 levels worse.  3.   Hypertension, stable. Continue her home medication.   CODE STATUS: FULL CODE.   Total time taking care of this patient is 40 minutes.    ____________________________ Lucina Mellow. Manuella Ghazi, MD vss:dmm D: 05/07/2013 20:45:23 ET T: 05/07/2013 22:02:34 ET JOB#: [id number]  cc: Skylynn Burkley S. Manuella Ghazi, MD, <Dictator> Raye Sorrow, MD Lucina Mellow [facility] MD ELECTRONICALLY SIGNED 05/13/2013 0:34

## 2014-05-27 ENCOUNTER — Other Ambulatory Visit: Payer: Self-pay | Admitting: Gastroenterology

## 2014-05-27 DIAGNOSIS — R1084 Generalized abdominal pain: Secondary | ICD-10-CM

## 2014-05-29 ENCOUNTER — Ambulatory Visit
Admission: RE | Admit: 2014-05-29 | Discharge: 2014-05-29 | Disposition: A | Discharge status: Home / Self Care | Payer: Medicaid Other | Source: Ambulatory Visit | Attending: Gastroenterology | Admitting: Gastroenterology

## 2014-05-29 DIAGNOSIS — R160 Hepatomegaly, not elsewhere classified: Secondary | ICD-10-CM | POA: Insufficient documentation

## 2014-05-29 DIAGNOSIS — K7689 Other specified diseases of liver: Secondary | ICD-10-CM | POA: Insufficient documentation

## 2014-05-29 DIAGNOSIS — R1084 Generalized abdominal pain: Secondary | ICD-10-CM

## 2014-05-29 HISTORY — DX: Type 2 diabetes mellitus without complications: E11.9

## 2014-05-29 MED ORDER — IOHEXOL 350 MG/ML SOLN
100.0000 mL | Freq: Once | INTRAVENOUS | Status: AC | PRN
  Administered 2014-05-29: 100 mL via INTRAVENOUS

## 2014-06-20 ENCOUNTER — Encounter: Payer: Self-pay | Admitting: *Deleted

## 2014-06-23 ENCOUNTER — Ambulatory Visit: Payer: Medicaid Other

## 2014-06-23 ENCOUNTER — Ambulatory Visit: Payer: Medicaid Other | Admitting: Anesthesiology

## 2014-06-23 ENCOUNTER — Ambulatory Visit
Admission: RE | Admit: 2014-06-23 | Discharge: 2014-06-23 | Disposition: A | Discharge status: Home / Self Care | Payer: Medicaid Other | Source: Ambulatory Visit | Attending: Gastroenterology | Admitting: Gastroenterology

## 2014-06-23 ENCOUNTER — Encounter: Payer: Self-pay | Admitting: *Deleted

## 2014-06-23 ENCOUNTER — Encounter
Admission: RE | Disposition: A | Discharge status: Home / Self Care | Payer: Self-pay | Source: Ambulatory Visit | Attending: Gastroenterology

## 2014-06-23 DIAGNOSIS — K648 Other hemorrhoids: Secondary | ICD-10-CM | POA: Insufficient documentation

## 2014-06-23 DIAGNOSIS — E669 Obesity, unspecified: Secondary | ICD-10-CM | POA: Insufficient documentation

## 2014-06-23 DIAGNOSIS — N189 Chronic kidney disease, unspecified: Secondary | ICD-10-CM | POA: Diagnosis not present

## 2014-06-23 DIAGNOSIS — I129 Hypertensive chronic kidney disease with stage 1 through stage 4 chronic kidney disease, or unspecified chronic kidney disease: Secondary | ICD-10-CM | POA: Diagnosis not present

## 2014-06-23 DIAGNOSIS — K5730 Diverticulosis of large intestine without perforation or abscess without bleeding: Secondary | ICD-10-CM | POA: Diagnosis not present

## 2014-06-23 DIAGNOSIS — Z79899 Other long term (current) drug therapy: Secondary | ICD-10-CM | POA: Insufficient documentation

## 2014-06-23 DIAGNOSIS — E755 Other lipid storage disorders: Secondary | ICD-10-CM | POA: Diagnosis not present

## 2014-06-23 DIAGNOSIS — Q780 Osteogenesis imperfecta: Secondary | ICD-10-CM | POA: Diagnosis not present

## 2014-06-23 DIAGNOSIS — Z6841 Body Mass Index (BMI) 40.0 and over, adult: Secondary | ICD-10-CM | POA: Insufficient documentation

## 2014-06-23 DIAGNOSIS — Z90710 Acquired absence of both cervix and uterus: Secondary | ICD-10-CM | POA: Diagnosis not present

## 2014-06-23 DIAGNOSIS — K621 Rectal polyp: Secondary | ICD-10-CM | POA: Insufficient documentation

## 2014-06-23 DIAGNOSIS — Z7951 Long term (current) use of inhaled steroids: Secondary | ICD-10-CM | POA: Diagnosis not present

## 2014-06-23 DIAGNOSIS — J45909 Unspecified asthma, uncomplicated: Secondary | ICD-10-CM | POA: Insufficient documentation

## 2014-06-23 DIAGNOSIS — E119 Type 2 diabetes mellitus without complications: Secondary | ICD-10-CM | POA: Insufficient documentation

## 2014-06-23 DIAGNOSIS — J449 Chronic obstructive pulmonary disease, unspecified: Secondary | ICD-10-CM | POA: Diagnosis not present

## 2014-06-23 DIAGNOSIS — K2950 Unspecified chronic gastritis without bleeding: Secondary | ICD-10-CM | POA: Insufficient documentation

## 2014-06-23 DIAGNOSIS — K625 Hemorrhage of anus and rectum: Secondary | ICD-10-CM | POA: Insufficient documentation

## 2014-06-23 DIAGNOSIS — K644 Residual hemorrhoidal skin tags: Secondary | ICD-10-CM | POA: Diagnosis not present

## 2014-06-23 DIAGNOSIS — K760 Fatty (change of) liver, not elsewhere classified: Secondary | ICD-10-CM | POA: Insufficient documentation

## 2014-06-23 DIAGNOSIS — K219 Gastro-esophageal reflux disease without esophagitis: Secondary | ICD-10-CM | POA: Insufficient documentation

## 2014-06-23 DIAGNOSIS — F419 Anxiety disorder, unspecified: Secondary | ICD-10-CM | POA: Diagnosis not present

## 2014-06-23 DIAGNOSIS — E039 Hypothyroidism, unspecified: Secondary | ICD-10-CM | POA: Insufficient documentation

## 2014-06-23 DIAGNOSIS — R112 Nausea with vomiting, unspecified: Secondary | ICD-10-CM | POA: Insufficient documentation

## 2014-06-23 DIAGNOSIS — R197 Diarrhea, unspecified: Secondary | ICD-10-CM | POA: Insufficient documentation

## 2014-06-23 DIAGNOSIS — Z8542 Personal history of malignant neoplasm of other parts of uterus: Secondary | ICD-10-CM | POA: Diagnosis not present

## 2014-06-23 DIAGNOSIS — E079 Disorder of thyroid, unspecified: Secondary | ICD-10-CM | POA: Diagnosis not present

## 2014-06-23 DIAGNOSIS — E8809 Other disorders of plasma-protein metabolism, not elsewhere classified: Secondary | ICD-10-CM | POA: Diagnosis not present

## 2014-06-23 DIAGNOSIS — K449 Diaphragmatic hernia without obstruction or gangrene: Secondary | ICD-10-CM | POA: Insufficient documentation

## 2014-06-23 DIAGNOSIS — G4730 Sleep apnea, unspecified: Secondary | ICD-10-CM | POA: Diagnosis not present

## 2014-06-23 DIAGNOSIS — K921 Melena: Secondary | ICD-10-CM | POA: Diagnosis not present

## 2014-06-23 DIAGNOSIS — R1084 Generalized abdominal pain: Secondary | ICD-10-CM | POA: Diagnosis present

## 2014-06-23 DIAGNOSIS — M549 Dorsalgia, unspecified: Secondary | ICD-10-CM | POA: Insufficient documentation

## 2014-06-23 DIAGNOSIS — G8918 Other acute postprocedural pain: Secondary | ICD-10-CM

## 2014-06-23 HISTORY — DX: Anxiety disorder, unspecified: F41.9

## 2014-06-23 HISTORY — DX: Cervical disc disorder, unspecified, unspecified cervical region: M50.90

## 2014-06-23 HISTORY — PX: COLONOSCOPY WITH PROPOFOL: SHX5780

## 2014-06-23 HISTORY — DX: Sleep apnea, unspecified: G47.30

## 2014-06-23 HISTORY — DX: Hypothyroidism, unspecified: E03.9

## 2014-06-23 HISTORY — DX: Chronic kidney disease, unspecified: N18.9

## 2014-06-23 HISTORY — PX: ESOPHAGOGASTRODUODENOSCOPY: SHX5428

## 2014-06-23 HISTORY — DX: Gastro-esophageal reflux disease without esophagitis: K21.9

## 2014-06-23 HISTORY — DX: Chronic obstructive pulmonary disease, unspecified: J44.9

## 2014-06-23 LAB — CBC
HCT: 41.6 % (ref 35.0–47.0)
Hemoglobin: 14.3 g/dL (ref 12.0–16.0)
MCH: 32.5 pg (ref 26.0–34.0)
MCHC: 34.5 g/dL (ref 32.0–36.0)
MCV: 94.2 fL (ref 80.0–100.0)
Platelets: 158 10*3/uL (ref 150–440)
RBC: 4.41 MIL/uL (ref 3.80–5.20)
RDW: 13.5 % (ref 11.5–14.5)
WBC: 6.3 10*3/uL (ref 3.6–11.0)

## 2014-06-23 LAB — PROTIME-INR
INR: 1.16
Prothrombin Time: 15 seconds (ref 11.4–15.0)

## 2014-06-23 LAB — GLUCOSE, CAPILLARY: Glucose-Capillary: 152 mg/dL — ABNORMAL HIGH (ref 65–99)

## 2014-06-23 SURGERY — COLONOSCOPY WITH PROPOFOL
Anesthesia: General

## 2014-06-23 MED ORDER — PROPOFOL INFUSION 10 MG/ML OPTIME
INTRAVENOUS | Status: DC | PRN
  Administered 2014-06-23: 250 ug/kg/min via INTRAVENOUS

## 2014-06-23 MED ORDER — MIDAZOLAM HCL 2 MG/2ML IJ SOLN: 1.0000 mg | Freq: Once | INTRAMUSCULAR | Status: DC

## 2014-06-23 MED ORDER — PROPOFOL 10 MG/ML IV BOLUS
INTRAVENOUS | Status: DC | PRN
  Administered 2014-06-23: 80 mg via INTRAVENOUS

## 2014-06-23 MED ORDER — MIDAZOLAM HCL 2 MG/2ML IJ SOLN
INTRAMUSCULAR | Status: AC
  Filled 2014-06-23: qty 2

## 2014-06-23 MED ORDER — SODIUM CHLORIDE 0.9 % IV SOLN
INTRAVENOUS | Status: DC
  Administered 2014-06-23 (×2): 1000 mL via INTRAVENOUS

## 2014-06-23 MED ORDER — LACTATED RINGERS IV SOLN
INTRAVENOUS | Status: DC | PRN
  Administered 2014-06-23: 12:00:00 via INTRAVENOUS

## 2014-06-23 MED ORDER — DEXMEDETOMIDINE HCL 200 MCG/2ML IV SOLN
INTRAVENOUS | Status: DC | PRN
  Administered 2014-06-23: 8 ug via INTRAVENOUS

## 2014-06-23 NOTE — Transfer of Care (Signed)
Immediate Anesthesia Transfer of Care Note  Patient: Lori Mckenzie  Procedure(s) Performed: Procedure(s): COLONOSCOPY WITH PROPOFOL (N/A) ESOPHAGOGASTRODUODENOSCOPY (EGD) (N/A)  Patient Location: PACU and Endoscopy Unit  Anesthesia Type:General  Level of Consciousness: awake and alert   Airway & Oxygen Therapy: Patient Spontanous Breathing and Patient connected to nasal cannula oxygen  Post-op Assessment: Report given to RN and Post -op Vital signs reviewed and stable  Post vital signs: Reviewed and stable  Last Vitals:  Filed Vitals:   06/23/14 0916  BP: 134/98  Pulse: 96  Temp: 36.1 C  Resp: 20    Complications: No apparent anesthesia complications

## 2014-06-23 NOTE — Anesthesia Preprocedure Evaluation (Signed)
Anesthesia Evaluation  Patient identified by MRN, date of birth, ID band Patient awake    Reviewed: Allergy & Precautions, NPO status   History of Anesthesia Complications Negative for: history of anesthetic complications  Airway Mallampati: III       Dental no notable dental hx. (+) Teeth Intact   Pulmonary sleep apnea and Continuous Positive Airway Pressure Ventilation , COPD COPD inhaler,    + decreased breath sounds      Cardiovascular hypertension, Pt. on medications Normal cardiovascular examRhythm:Regular     Neuro/Psych Anxiety negative neurological ROS     GI/Hepatic Neg liver ROS, GERD-  Medicated,  Endo/Other  diabetes, Type 2, Oral Hypoglycemic AgentsHypothyroidism   Renal/GU Renal disease  negative genitourinary   Musculoskeletal negative musculoskeletal ROS (+)   Abdominal (+) + obese,   Peds negative pediatric ROS (+)  Hematology negative hematology ROS (+)   Anesthesia Other Findings   Reproductive/Obstetrics negative OB ROS                             Anesthesia Physical Anesthesia Plan  ASA: III  Anesthesia Plan: General   Post-op Pain Management:    Induction: Intravenous  Airway Management Planned: Nasal Cannula  Additional Equipment:   Intra-op Plan:   Post-operative Plan:   Informed Consent: I have reviewed the patients History and Physical, chart, labs and discussed the procedure including the risks, benefits and alternatives for the proposed anesthesia with the patient or authorized representative who has indicated his/her understanding and acceptance.     Plan Discussed with: CRNA  Anesthesia Plan Comments:         Anesthesia Quick Evaluation

## 2014-06-23 NOTE — H&P (Signed)
Outpatient short stay form Pre-procedure 06/23/2014 11:26 AM Lori Sails MD  Primary Physician: Dr. Yolonda Mckenzie  Reason for visit:  Abdominal pain, nausea and vomiting, dyspepsia. She also reports having black stools and rectal bleeding. He is presenting today for ED and colonoscopy.  History of present illness:  49 year old female sending for evaluation of abdominal pain. He reports intermittent left upper quadrant right lower quadrant abdominal pain. She reports having gone to the emergency room on 03/27/2014 and again at another date. She was found have elevated liver associated enzymes as well as an ultrasound K needed all bladder disease. There were no recommendations for surgical referral or 8 minute at that time. He states that she was placed on an outpatient course of anti-biotics but that has been a long time ago. He is also found to have on ultrasound a fatty liver. Her ultrasound done on 01/09/2014 showed common hepatic duct at 4.8 mm the proximal common bile duct was 7.4 mm in the distal bile duct 7.0 mm. The gallbladder wall measured 2.9 mm area impression was no sonographic evidence of gallstones or acute cholelithiasis. Common bile duct diameter was upper limits of normal. He had a CT scan done same date 0.0 evidence of aortic dissection. There was hepatic steatosis with hepatomegaly enlargement of the splenic vein and porta hepatis adenopathy." These findings can be seen in [place] "interpretation favored focal fatty infiltration. There was a comment on the ultrasound stating that the gallbladder was mildly hydropic without internal stones or sludge. I discussed this at length with her today. She states she has been having nausea but no vomiting because she is been taking Phenergan. She has abdominal pain but this is mostly lower abdomen. She has some anorexia and relates losing pounds over the past 3 months. He is having problems with diarrhea. She has seen bright red rectal bleeding but this  is only on the toilet paper. She also relates seen black stools. This occurred for several weeks in May or June new dates. She does have a history of hemorrhoids. She has no history of peptic ulcer disease area and she denies taking aspirin or NSAID products. She does not take anticoagulates. She tolerated her prep for colonoscopy.  Review of her chart indicates that she has been prescribed 800 mg Motrin.    Current facility-administered medications:  .  0.9 %  sodium chloride infusion, , Intravenous, Continuous, Lori Sails, MD, Last Rate: 20 mL/hr at 06/23/14 1101, 1,000 mL at 06/23/14 1101 .  midazolam (VERSED) 2 MG/2ML injection, , , ,  .  midazolam (VERSED) injection 1 mg, 1 mg, Intravenous, Once, [facility], MD  Prescriptions prior to admission  Medication Sig Dispense Refill Last Dose  . albuterol (PROVENTIL HFA;VENTOLIN HFA) 108 (90 BASE) MCG/ACT inhaler Inhale 2 puffs into the lungs every 4 (four) hours as needed for wheezing or shortness of breath.     . alprazolam (XANAX) 2 MG tablet Take 2 mg by mouth 3 (three) times daily as needed for anxiety.    03/17/2011 at Unknown  . citalopram (CELEXA) 20 MG tablet Take 20 mg by mouth daily.     . clotrimazole (MYCELEX) 10 MG troche Take 10 mg by mouth 4 (four) times daily as needed (mouth dryness).     . fluticasone (FLONASE) 50 MCG/ACT nasal spray Place 1 spray into both nostrils daily as needed (dryness).      . Fluticasone-Salmeterol (ADVAIR) 100-50 MCG/DOSE AEPB Inhale 1 puff into the lungs 2 (  two) times daily.     . furosemide (LASIX) 40 MG tablet Take 80 mg by mouth daily.     Marland Kitchen gabapentin (NEURONTIN) 100 MG capsule Take 300 mg by mouth 4 (four) times daily as needed (pain).     . hydrochlorothiazide (HYDRODIURIL) 25 MG tablet Take 25 mg by mouth daily.     Marland Kitchen ibuprofen (ADVIL,MOTRIN) 800 MG tablet Take 800 mg by mouth every 8 (eight) hours as needed for moderate pain.    03/17/2011 at Unknown  . ipratropium  (ATROVENT HFA) 17 MCG/ACT inhaler Inhale 2 puffs into the lungs every 8 (eight) hours.     Marland Kitchen ipratropium-albuterol (DUONEB) 0.5-2.5 (3) MG/3ML SOLN Take 6 mLs by nebulization every 6 (six) hours as needed (SOB).     . metFORMIN (GLUCOPHAGE) 1000 MG tablet Take 1,000 mg by mouth 2 (two) times daily. If blood sugar is higher than 105     . metFORMIN (GLUCOPHAGE) 500 MG tablet Take 500 mg by mouth 2 (two) times daily. If sugar is between 90-105     . methocarbamol (ROBAXIN) 500 MG tablet Take 500 mg by mouth 2 (two) times daily as needed for muscle spasms.      . NON FORMULARY at bedtime. cpap     . omeprazole (PRILOSEC) 20 MG capsule Take 40 mg by mouth 2 (two) times daily as needed (acid reflux).     . Oxycodone HCl 20 MG TABS Take 1 tablet by mouth 2 (two) times daily.     . OXYGEN Inhale into the lungs 4 (four) times daily.     . rizatriptan (MAXALT) 10 MG tablet Take 10 mg by mouth as needed for migraine. May repeat in 2 hours if needed     . spironolactone (ALDACTONE) 50 MG tablet Take 50 mg by mouth daily.     Marland Kitchen zolpidem (AMBIEN) 10 MG tablet Take 10 mg by mouth at bedtime as needed for sleep.         Allergies  Allergen Reactions  . Hydrocodone Itching    Other reaction(s): ITCHING  . Vicodin [Hydrocodone-Acetaminophen] Rash     Past Medical History  Diagnosis Date  . Brittle bone disease   . Asthma   . Thyroid disease   . Hypertension   . Back pain   . Diabetes mellitus without complication   . Chronic kidney disease   . Anxiety   . GERD (gastroesophageal reflux disease)   . Sleep apnea   . COPD (chronic obstructive pulmonary disease)   . Hypothyroidism   . Cervical disc disease     Review of systems:      Physical Exam    Heart and lungs: Regular rate and rhythm lungs are bilaterally clear    HEENT: Normal cephalic atraumatic eyes are anicteric    Other:     Pertinant exam for procedure: Obese soft diffuse discomfort mostly lower abdomen and epigastric  region. No masses rebound or organomegaly. I'll sounds are positive normoactive is no apparent distention.    Planned proceedures: EGD and colonoscopy with indicated procedures I have discussed the risks benefits and complications of procedures to include not limited to bleeding, infection, perforation and the risk of sedation and the patient wishes to proceed.    Lori Sails, MD Gastroenterology 06/23/2014  11:26 AM

## 2014-06-23 NOTE — Op Note (Signed)
[facility] Gastroenterology Patient Name: Nakenya Theall Procedure Date: 06/23/2014 11:36 AM MRN: [id number] Account #: [id number] Date of Birth: Aug 05, 1965 Admit Type: Outpatient Age: 49 Room: [facility] ENDO ROOM 2 Gender: Female Note Status: Finalized Procedure:         Colonoscopy Indications:       Lower abdominal pain, Clinically significant diarrhea of                     unexplained origin, Rectal bleeding Providers:         Lollie Sails, MD Referring MD:      Neldon Labella. Ashkin, MD (Referring MD) Medicines:         Monitored Anesthesia Care Complications:     No immediate complications. Procedure:         Pre-Anesthesia Assessment:                    - ASA Grade Assessment: III - A patient with severe                     systemic disease.                    After obtaining informed consent, the colonoscope was                     passed under direct vision. Throughout the procedure, the                     patient's blood pressure, pulse, and oxygen saturations                     were monitored continuously. The Olympus PCF-160AL                     Colonoscope (S# [id number]) was introduced through the anus                     and advanced to the the cecum, identified by appendiceal                     orifice and ileocecal valve. The quality of the bowel                     preparation was good. Findings:      A 2 mm polyp was found in the rectum. The polyp was sessile. The polyp       was removed with a cold biopsy forceps. Resection and retrieval were       complete.      Multiple medium-mouthed diverticula were found in the sigmoid colon, in       the descending colon, in the transverse colon, in the ascending colon       and in the cecum.      Non-bleeding external and internal hemorrhoids were found during       perianal exam, during digital exam and during anoscopy. The hemorrhoids       were medium-sized.      the mucosa of the rectum is noted to be  friable, with some mucosal       cracking noted from passage of the scope, biopsies taken. No other       evidence of scope trauma noted throughout.      Biopsies for histology were taken with a cold forceps from the right  colon and left colon for evaluation of microscopic colitis. Impression:        - One 2 mm polyp in the rectum. Resected and retrieved.                    - Diverticulosis in the sigmoid colon, in the descending                     colon, in the transverse colon, in the ascending colon and                     in the cecum.                    - Non-bleeding external and internal hemorrhoids.                    - Biopsies were taken with a cold forceps from the right                     colon and left colon for evaluation of microscopic colitis. Recommendation:    - Discharge patient to home.                    - Use Citrucel one tablespoon PO daily daily.                    - Return to GI clinic in 3 weeks. Procedure Code(s): --- Professional ---                    [phone], Colonoscopy, flexible; with biopsy, single or                     multiple Diagnosis Code(s): --- Professional ---                    569.0, Anal and rectal polyp                    455.0, Internal hemorrhoids without mention of complication                    455.3, External hemorrhoids without mention of complication                    789.09, Abdominal pain, other specified site                    787.91, Diarrhea                    569.3, Hemorrhage of rectum and anus                    562.10, Diverticulosis of colon (without mention of                     hemorrhage) CPT copyright 2014 American Medical Association. All rights reserved. The codes documented in this report are preliminary and upon coder review may  be revised to meet current compliance requirements. Lollie Sails, MD 06/23/2014 12:25:02 PM This report has been signed electronically. Number of Addenda: 0 Note Initiated  On: 06/23/2014 11:36 AM Scope Withdrawal Time: 0 hours 11 minutes 25 seconds  Total Procedure Duration: 0 hours 18 minutes 12 seconds       [facility]

## 2014-06-23 NOTE — Op Note (Signed)
[facility] Gastroenterology Patient Name: Lori Mckenzie Procedure Date: 06/23/2014 11:37 AM MRN: [id number] Account #: [id number] Date of Birth: [birth year] Admit Type: Outpatient Age: 49 Room: [facility] ENDO ROOM 2 Gender: Female Note Status: Finalized Procedure:         Upper GI endoscopy Indications:       Generalized abdominal pain, Dyspepsia, Nausea with vomiting Providers:         Lollie Sails, MD Referring MD:      Neldon Labella. Ashkin, MD (Referring MD) Medicines:         Monitored Anesthesia Care Complications:     No immediate complications. Procedure:         Pre-Anesthesia Assessment:                    - ASA Grade Assessment: III - A patient with severe                     systemic disease.                    After obtaining informed consent, the endoscope was passed                     under direct vision. Throughout the procedure, the                     patient's blood pressure, pulse, and oxygen saturations                     were monitored continuously. The Olympus GIF-160 endoscope                     (S#. [id number]) was introduced through the mouth, and                     advanced to the fourth part of duodenum. The upper GI                     endoscopy was accomplished without difficulty. The patient                     tolerated the procedure well. Findings:      The Z-line was variable. Biopsies were taken with a cold forceps for       histology.      Diffuse minimal erythematous mucosa without bleeding was found in the       gastric body. Biopsies were taken with a cold forceps for histology.       Biopsies were taken with a cold forceps for Helicobacter pylori testing.      sharp J shape to the gastric vault      The examined duodenum was normal.      The cardia and gastric fundus were normal on retroflexion. Impression:        - Z-line variable. Biopsied.                    - Erythematous mucosa in the gastric body. Biopsied.    - Normal examined duodenum. Recommendation:    - Continue present medications.                    - Return to GI clinic in 3 weeks.                    -  Await pathology results. Procedure Code(s): --- Professional ---                    [phone], Esophagogastroduodenoscopy, flexible, transoral;                     with biopsy, single or multiple Diagnosis Code(s): --- Professional ---                    530.89, Other specified disorders of esophagus                    537.89, Other specified disorders of stomach and duodenum                    789.07, Abdominal pain, generalized                    536.8, Dyspepsia and other specified disorders of function                     of stomach                    787.01, Nausea with vomiting CPT copyright 2014 American Medical Association. All rights reserved. The codes documented in this report are preliminary and upon coder review may  be revised to meet current compliance requirements. Lollie Sails, MD 06/23/2014 11:57:51 AM This report has been signed electronically. Number of Addenda: 0 Note Initiated On: 06/23/2014 11:37 AM      [facility]

## 2014-06-23 NOTE — Anesthesia Postprocedure Evaluation (Signed)
  Anesthesia Post-op Note  Patient: Lori Mckenzie  Procedure(s) Performed: Procedure(s): COLONOSCOPY WITH PROPOFOL (N/A) ESOPHAGOGASTRODUODENOSCOPY (EGD) (N/A)  Anesthesia type:General  Patient location: PACU  Post pain: Pain level controlled  Post assessment: Post-op Vital signs reviewed, Patient's Cardiovascular Status Stable, Respiratory Function Stable, Patent Airway and No signs of Nausea or vomiting  Post vital signs: Reviewed and stable  Last Vitals:  Filed Vitals:   06/23/14 1320  BP: 100/69  Pulse: 83  Temp:   Resp: 21    Level of consciousness: awake, alert  and patient cooperative  Complications: No apparent anesthesia complications

## 2014-06-24 LAB — SURGICAL PATHOLOGY

## 2014-06-24 NOTE — Progress Notes (Signed)
Pt. States she passed a few 'clots' yesterday.Dr Gustavo Lah aware.

## 2014-06-30 ENCOUNTER — Encounter: Payer: Self-pay | Admitting: Gastroenterology

## 2014-07-03 ENCOUNTER — Other Ambulatory Visit: Payer: Self-pay | Admitting: Gastroenterology

## 2014-07-03 DIAGNOSIS — N83201 Unspecified ovarian cyst, right side: Secondary | ICD-10-CM

## 2014-07-03 DIAGNOSIS — N83202 Unspecified ovarian cyst, left side: Principal | ICD-10-CM

## 2014-07-08 ENCOUNTER — Ambulatory Visit: Payer: Medicaid Other

## 2014-07-10 ENCOUNTER — Ambulatory Visit: Payer: Medicaid Other

## 2014-07-10 ENCOUNTER — Ambulatory Visit
Admission: RE | Admit: 2014-07-10 | Discharge: 2014-07-10 | Disposition: A | Discharge status: Home / Self Care | Payer: Medicaid Other | Source: Ambulatory Visit | Attending: Gastroenterology | Admitting: Gastroenterology

## 2014-07-10 DIAGNOSIS — N83202 Unspecified ovarian cyst, left side: Secondary | ICD-10-CM

## 2014-07-10 DIAGNOSIS — N8320 Unspecified ovarian cysts: Secondary | ICD-10-CM | POA: Diagnosis present

## 2014-07-10 DIAGNOSIS — N83201 Unspecified ovarian cyst, right side: Secondary | ICD-10-CM

## 2014-07-12 ENCOUNTER — Inpatient Hospital Stay
Admission: EM | Admit: 2014-07-12 | Discharge: 2014-07-16 | DRG: 409 | Disposition: A | Discharge status: Other Acute Inpatient Hospital | Payer: Medicaid Other | Attending: Internal Medicine | Admitting: Internal Medicine

## 2014-07-12 ENCOUNTER — Emergency Department: Payer: Medicaid Other

## 2014-07-12 ENCOUNTER — Encounter: Payer: Self-pay | Admitting: General Practice

## 2014-07-12 DIAGNOSIS — K8050 Calculus of bile duct without cholangitis or cholecystitis without obstruction: Secondary | ICD-10-CM | POA: Diagnosis present

## 2014-07-12 DIAGNOSIS — N189 Chronic kidney disease, unspecified: Secondary | ICD-10-CM | POA: Diagnosis present

## 2014-07-12 DIAGNOSIS — G4730 Sleep apnea, unspecified: Secondary | ICD-10-CM | POA: Diagnosis not present

## 2014-07-12 DIAGNOSIS — K819 Cholecystitis, unspecified: Secondary | ICD-10-CM | POA: Diagnosis not present

## 2014-07-12 DIAGNOSIS — Z8673 Personal history of transient ischemic attack (TIA), and cerebral infarction without residual deficits: Secondary | ICD-10-CM | POA: Diagnosis not present

## 2014-07-12 DIAGNOSIS — R109 Unspecified abdominal pain: Secondary | ICD-10-CM

## 2014-07-12 DIAGNOSIS — Z7951 Long term (current) use of inhaled steroids: Secondary | ICD-10-CM | POA: Diagnosis not present

## 2014-07-12 DIAGNOSIS — I959 Hypotension, unspecified: Secondary | ICD-10-CM | POA: Diagnosis present

## 2014-07-12 DIAGNOSIS — Z79899 Other long term (current) drug therapy: Secondary | ICD-10-CM

## 2014-07-12 DIAGNOSIS — D696 Thrombocytopenia, unspecified: Secondary | ICD-10-CM | POA: Diagnosis present

## 2014-07-12 DIAGNOSIS — E876 Hypokalemia: Secondary | ICD-10-CM | POA: Diagnosis not present

## 2014-07-12 DIAGNOSIS — K9189 Other postprocedural complications and disorders of digestive system: Secondary | ICD-10-CM

## 2014-07-12 DIAGNOSIS — K8064 Calculus of gallbladder and bile duct with chronic cholecystitis without obstruction: Principal | ICD-10-CM | POA: Diagnosis present

## 2014-07-12 DIAGNOSIS — Z9851 Tubal ligation status: Secondary | ICD-10-CM | POA: Diagnosis not present

## 2014-07-12 DIAGNOSIS — E039 Hypothyroidism, unspecified: Secondary | ICD-10-CM | POA: Diagnosis present

## 2014-07-12 DIAGNOSIS — K838 Other specified diseases of biliary tract: Secondary | ICD-10-CM

## 2014-07-12 DIAGNOSIS — K7581 Nonalcoholic steatohepatitis (NASH): Secondary | ICD-10-CM | POA: Diagnosis present

## 2014-07-12 DIAGNOSIS — E119 Type 2 diabetes mellitus without complications: Secondary | ICD-10-CM | POA: Diagnosis present

## 2014-07-12 DIAGNOSIS — F419 Anxiety disorder, unspecified: Secondary | ICD-10-CM | POA: Diagnosis present

## 2014-07-12 DIAGNOSIS — J029 Acute pharyngitis, unspecified: Secondary | ICD-10-CM | POA: Diagnosis present

## 2014-07-12 DIAGNOSIS — Z8249 Family history of ischemic heart disease and other diseases of the circulatory system: Secondary | ICD-10-CM | POA: Diagnosis not present

## 2014-07-12 DIAGNOSIS — R1011 Right upper quadrant pain: Secondary | ICD-10-CM | POA: Diagnosis not present

## 2014-07-12 DIAGNOSIS — Z801 Family history of malignant neoplasm of trachea, bronchus and lung: Secondary | ICD-10-CM | POA: Diagnosis not present

## 2014-07-12 DIAGNOSIS — Z6841 Body Mass Index (BMI) 40.0 and over, adult: Secondary | ICD-10-CM

## 2014-07-12 DIAGNOSIS — G4733 Obstructive sleep apnea (adult) (pediatric): Secondary | ICD-10-CM | POA: Diagnosis present

## 2014-07-12 DIAGNOSIS — K759 Inflammatory liver disease, unspecified: Secondary | ICD-10-CM | POA: Diagnosis not present

## 2014-07-12 DIAGNOSIS — Z90710 Acquired absence of both cervix and uterus: Secondary | ICD-10-CM

## 2014-07-12 DIAGNOSIS — J449 Chronic obstructive pulmonary disease, unspecified: Secondary | ICD-10-CM | POA: Diagnosis present

## 2014-07-12 DIAGNOSIS — K529 Noninfective gastroenteritis and colitis, unspecified: Secondary | ICD-10-CM | POA: Diagnosis present

## 2014-07-12 DIAGNOSIS — J441 Chronic obstructive pulmonary disease with (acute) exacerbation: Secondary | ICD-10-CM | POA: Diagnosis present

## 2014-07-12 DIAGNOSIS — Z79891 Long term (current) use of opiate analgesic: Secondary | ICD-10-CM | POA: Diagnosis not present

## 2014-07-12 DIAGNOSIS — I129 Hypertensive chronic kidney disease with stage 1 through stage 4 chronic kidney disease, or unspecified chronic kidney disease: Secondary | ICD-10-CM | POA: Diagnosis present

## 2014-07-12 DIAGNOSIS — Z885 Allergy status to narcotic agent status: Secondary | ICD-10-CM | POA: Diagnosis not present

## 2014-07-12 DIAGNOSIS — Z9989 Dependence on other enabling machines and devices: Secondary | ICD-10-CM

## 2014-07-12 DIAGNOSIS — J45909 Unspecified asthma, uncomplicated: Secondary | ICD-10-CM | POA: Diagnosis present

## 2014-07-12 DIAGNOSIS — E1122 Type 2 diabetes mellitus with diabetic chronic kidney disease: Secondary | ICD-10-CM | POA: Diagnosis not present

## 2014-07-12 DIAGNOSIS — K219 Gastro-esophageal reflux disease without esophagitis: Secondary | ICD-10-CM | POA: Diagnosis present

## 2014-07-12 DIAGNOSIS — Z9981 Dependence on supplemental oxygen: Secondary | ICD-10-CM | POA: Diagnosis not present

## 2014-07-12 HISTORY — DX: Transient cerebral ischemic attack, unspecified: G45.9

## 2014-07-12 LAB — LIPASE, BLOOD: Lipase: 28 U/L (ref 22–51)

## 2014-07-12 LAB — COMPREHENSIVE METABOLIC PANEL
ALT: 39 U/L (ref 14–54)
AST: 58 U/L — ABNORMAL HIGH (ref 15–41)
Albumin: 4.4 g/dL (ref 3.5–5.0)
Alkaline Phosphatase: 45 U/L (ref 38–126)
Anion gap: 13 (ref 5–15)
BUN: 12 mg/dL (ref 6–20)
CO2: 23 mmol/L (ref 22–32)
Calcium: 9.5 mg/dL (ref 8.9–10.3)
Chloride: 103 mmol/L (ref 101–111)
Creatinine, Ser: 0.69 mg/dL (ref 0.44–1.00)
GFR calc Af Amer: >60 mL/min (ref >60)
GFR calc non Af Amer: >60 mL/min (ref >60)
Glucose, Bld: 109 mg/dL — ABNORMAL HIGH (ref 65–99)
Potassium: 3.6 mmol/L (ref 3.5–5.1)
Sodium: 139 mmol/L (ref 135–145)
Total Bilirubin: 1.7 mg/dL — ABNORMAL HIGH (ref 0.3–1.2)
Total Protein: 8.1 g/dL (ref 6.5–8.1)

## 2014-07-12 LAB — CBC WITH DIFFERENTIAL/PLATELET
Basophils Absolute: 0.1 10*3/uL (ref 0–0.1)
Basophils Relative: 1 %
Eosinophils Absolute: 0 10*3/uL (ref 0–0.7)
Eosinophils Relative: 1 %
HCT: 41 % (ref 35.0–47.0)
Hemoglobin: 14.3 g/dL (ref 12.0–16.0)
Lymphocytes Relative: 28 %
Lymphs Abs: 2.3 10*3/uL (ref 1.0–3.6)
MCH: 32.6 pg (ref 26.0–34.0)
MCHC: 34.8 g/dL (ref 32.0–36.0)
MCV: 93.6 fL (ref 80.0–100.0)
Monocytes Absolute: 0.3 10*3/uL (ref 0.2–0.9)
Monocytes Relative: 4 %
Neutro Abs: 5.5 10*3/uL (ref 1.4–6.5)
Neutrophils Relative %: 66 %
Platelets: 136 10*3/uL — ABNORMAL LOW (ref 150–440)
RBC: 4.38 MIL/uL (ref 3.80–5.20)
RDW: 13.5 % (ref 11.5–14.5)
WBC: 8.2 10*3/uL (ref 3.6–11.0)

## 2014-07-12 LAB — TROPONIN I: Troponin I: <0.03 ng/mL (ref <0.031)

## 2014-07-12 MED ORDER — ACETAMINOPHEN 325 MG PO TABS: 650.0000 mg | ORAL_TABLET | Freq: Four times a day (QID) | ORAL | Status: DC | PRN

## 2014-07-12 MED ORDER — ZOLPIDEM TARTRATE 5 MG PO TABS
5.0000 mg | ORAL_TABLET | Freq: Every evening | ORAL | Status: DC | PRN
  Administered 2014-07-13: 5 mg via ORAL
  Filled 2014-07-12: qty 1

## 2014-07-12 MED ORDER — HYDROMORPHONE HCL 1 MG/ML IJ SOLN
0.5000 mg | Freq: Once | INTRAMUSCULAR | Status: AC
  Administered 2014-07-12: 0.5 mg via INTRAVENOUS

## 2014-07-12 MED ORDER — SODIUM CHLORIDE 0.9 % IV SOLN
INTRAVENOUS | Status: AC
  Administered 2014-07-13: via INTRAVENOUS

## 2014-07-12 MED ORDER — FUROSEMIDE 40 MG PO TABS
80.0000 mg | ORAL_TABLET | Freq: Every day | ORAL | Status: DC
  Administered 2014-07-13 – 2014-07-14 (×2): 80 mg via ORAL
  Filled 2014-07-12 (×2): qty 2

## 2014-07-12 MED ORDER — ONDANSETRON HCL 4 MG/2ML IJ SOLN
INTRAMUSCULAR | Status: AC
  Administered 2014-07-12: 4 mg via INTRAVENOUS
  Filled 2014-07-12: qty 2

## 2014-07-12 MED ORDER — ONDANSETRON HCL 4 MG/2ML IJ SOLN
4.0000 mg | Freq: Four times a day (QID) | INTRAMUSCULAR | Status: DC | PRN
  Administered 2014-07-13: 4 mg via INTRAVENOUS
  Filled 2014-07-12: qty 2

## 2014-07-12 MED ORDER — HYDROMORPHONE HCL 1 MG/ML IJ SOLN: 1.0000 mg | Freq: Once | INTRAMUSCULAR | Status: DC

## 2014-07-12 MED ORDER — SPIRONOLACTONE 25 MG PO TABS
50.0000 mg | ORAL_TABLET | Freq: Every day | ORAL | Status: DC
  Administered 2014-07-14: 50 mg via ORAL
  Filled 2014-07-12 (×2): qty 2

## 2014-07-12 MED ORDER — SODIUM CHLORIDE 0.9 % IV BOLUS (SEPSIS)
1000.0000 mL | INTRAVENOUS | Status: AC
  Administered 2014-07-12: 1000 mL via INTRAVENOUS

## 2014-07-12 MED ORDER — INSULIN ASPART 100 UNIT/ML ~~LOC~~ SOLN
0.0000 [IU] | Freq: Four times a day (QID) | SUBCUTANEOUS | Status: DC
  Administered 2014-07-14: 1 [IU] via SUBCUTANEOUS
  Administered 2014-07-15: 2 [IU] via SUBCUTANEOUS
  Administered 2014-07-16: 1 [IU] via SUBCUTANEOUS
  Filled 2014-07-12: qty 1
  Filled 2014-07-12: qty 2
  Filled 2014-07-12: qty 1

## 2014-07-12 MED ORDER — HYDROMORPHONE HCL 1 MG/ML IJ SOLN
1.0000 mg | INTRAMUSCULAR | Status: DC | PRN
  Administered 2014-07-13 – 2014-07-16 (×21): 1 mg via INTRAVENOUS
  Filled 2014-07-12 (×21): qty 1

## 2014-07-12 MED ORDER — MOMETASONE FURO-FORMOTEROL FUM 100-5 MCG/ACT IN AERO
2.0000 | INHALATION_SPRAY | Freq: Two times a day (BID) | RESPIRATORY_TRACT | Status: DC
  Administered 2014-07-13 – 2014-07-15 (×5): 2 via RESPIRATORY_TRACT
  Filled 2014-07-12: qty 8.8

## 2014-07-12 MED ORDER — PANTOPRAZOLE SODIUM 40 MG PO TBEC
40.0000 mg | DELAYED_RELEASE_TABLET | Freq: Two times a day (BID) | ORAL | Status: DC
  Administered 2014-07-13 – 2014-07-16 (×7): 40 mg via ORAL
  Filled 2014-07-12 (×7): qty 1

## 2014-07-12 MED ORDER — ONDANSETRON HCL 4 MG PO TABS: 4.0000 mg | ORAL_TABLET | Freq: Four times a day (QID) | ORAL | Status: DC | PRN

## 2014-07-12 MED ORDER — ALBUTEROL SULFATE (2.5 MG/3ML) 0.083% IN NEBU: 3.0000 mL | INHALATION_SOLUTION | RESPIRATORY_TRACT | Status: DC | PRN

## 2014-07-12 MED ORDER — ALPRAZOLAM 1 MG PO TABS
2.0000 mg | ORAL_TABLET | Freq: Three times a day (TID) | ORAL | Status: DC | PRN
  Administered 2014-07-13 – 2014-07-14 (×3): 2 mg via ORAL
  Administered 2014-07-14 (×2): 1 mg via ORAL
  Administered 2014-07-15 – 2014-07-16 (×4): 2 mg via ORAL
  Filled 2014-07-12: qty 2
  Filled 2014-07-12 (×2): qty 1
  Filled 2014-07-12 (×6): qty 2

## 2014-07-12 MED ORDER — ENOXAPARIN SODIUM 40 MG/0.4ML ~~LOC~~ SOLN
40.0000 mg | Freq: Two times a day (BID) | SUBCUTANEOUS | Status: DC
  Administered 2014-07-13: 40 mg via SUBCUTANEOUS
  Filled 2014-07-12 (×2): qty 0.4

## 2014-07-12 MED ORDER — HYDROMORPHONE HCL 1 MG/ML IJ SOLN
INTRAMUSCULAR | Status: AC
  Administered 2014-07-12: 1 mg via INTRAVENOUS
  Filled 2014-07-12: qty 1

## 2014-07-12 MED ORDER — ACETAMINOPHEN 650 MG RE SUPP: 650.0000 mg | Freq: Four times a day (QID) | RECTAL | Status: DC | PRN

## 2014-07-12 MED ORDER — ONDANSETRON HCL 4 MG/2ML IJ SOLN
4.0000 mg | INTRAMUSCULAR | Status: AC
  Administered 2014-07-12: 4 mg via INTRAVENOUS

## 2014-07-12 MED ORDER — HYDROMORPHONE HCL 1 MG/ML IJ SOLN
1.0000 mg | INTRAMUSCULAR | Status: AC
  Administered 2014-07-12: 1 mg via INTRAVENOUS

## 2014-07-12 MED ORDER — GABAPENTIN 300 MG PO CAPS
300.0000 mg | ORAL_CAPSULE | Freq: Four times a day (QID) | ORAL | Status: DC | PRN
  Administered 2014-07-14: 300 mg via ORAL
  Filled 2014-07-12: qty 1

## 2014-07-12 MED ORDER — CITALOPRAM HYDROBROMIDE 20 MG PO TABS
20.0000 mg | ORAL_TABLET | Freq: Every day | ORAL | Status: DC
  Administered 2014-07-13 – 2014-07-16 (×3): 20 mg via ORAL
  Filled 2014-07-12 (×3): qty 1

## 2014-07-12 MED ORDER — HYDROMORPHONE HCL 1 MG/ML IJ SOLN
INTRAMUSCULAR | Status: AC
  Filled 2014-07-12: qty 1

## 2014-07-12 NOTE — ED Notes (Signed)
Patient transported to Ultrasound 

## 2014-07-12 NOTE — Progress Notes (Signed)
Patient has refused cpap therapy at this time. States she does not wear her home unit every night. Requested patient or husband have rn to contact RT is she wishes to start cpap therapy. rn aware

## 2014-07-12 NOTE — ED Notes (Signed)
Pt. Arrived to ed from home with reports of experiencing RUQ pain x days, but reports increase pain in the last hour. Pt reports she was told her gallbladder needs to be removed, per pt. Pt reports expeiencing "intense" pain. Pt reports nausea and vomting. Alert and Oriented. Guarding right upper abdomen in triage.

## 2014-07-12 NOTE — ED Provider Notes (Signed)
[facility] Emergency Department Provider Note  ____________________________________________  Time seen: Approximately 7:16 PM  I have reviewed the triage vital signs and the nursing notes.   HISTORY  Chief Complaint Abdominal Pain and Nausea    HPI Lori Mckenzie is a 49 y.o. female with multiple chronic medical problems including chronic upper and lower abdominal pain who sees Dr. Gustavo Lah for GI who presents with acute onset of severe epigastric and right upper quadrant pain that started about 3 hours ago.  It feels similar but worse problems she has had in the past.  Been told before that she should have her gallbladder out but it is unclear exactly what diagnoses she has been given.  Her severe, sharp, stabbing abdominal pain is accompanied by repeated nausea and vomiting.  She also has chronic loose stools.  She denies chest pain and shortness of breath and fever/chills.   Past Medical History  Diagnosis Date  . Brittle bone disease   . Asthma   . Thyroid disease   . Hypertension   . Back pain   . Diabetes mellitus without complication   . Chronic kidney disease   . Anxiety   . GERD (gastroesophageal reflux disease)   . Sleep apnea   . COPD (chronic obstructive pulmonary disease)   . Hypothyroidism   . Cervical disc disease   . TIA (transient ischemic attack)     Patient Active Problem List   Diagnosis Date Noted  . Choledocholithiasis 07/12/2014  . Type 2 diabetes mellitus 07/12/2014  . COPD (chronic obstructive pulmonary disease) 07/12/2014  . HTN (hypertension) 07/12/2014  . GERD (gastroesophageal reflux disease) 07/12/2014  . OSA on CPAP 07/12/2014  . Anxiety 07/12/2014    Past Surgical History  Procedure Laterality Date  . Abdominal hysterectomy    . Tubal ligation    . Colonoscopy with propofol N/A 06/23/2014    Procedure: COLONOSCOPY WITH PROPOFOL;  Surgeon: Lollie Sails, MD;  Location: [facility] ENDOSCOPY;  Service: Endoscopy;   Laterality: N/A;  . Esophagogastroduodenoscopy N/A 06/23/2014    Procedure: ESOPHAGOGASTRODUODENOSCOPY (EGD);  Surgeon: Lollie Sails, MD;  Location: [facility] ENDOSCOPY;  Service: Endoscopy;  Laterality: N/A;    No current outpatient prescriptions on file.  Allergies Hydrocodone and Vicodin  Family History  Problem Relation Age of Onset  . Lung cancer Father   . Ulcers Father   . Heart disease Sister   . Ulcers Sister   . Heart disease Brother     Social History History  Substance Use Topics  . Smoking status: Never Smoker   . Smokeless tobacco: Not on file  . Alcohol Use: No     Comment: occ    Review of Systems Constitutional: No fever/chills Eyes: No visual changes. ENT: No sore throat. Cardiovascular: Denies chest pain. Respiratory: Denies shortness of breath. Gastrointestinal: Sharp stabbing upper abdominal pain with nausea and vomiting.  Chronic diarrhea.  No constipation. Genitourinary: Negative for dysuria. Musculoskeletal: Negative for back pain. Skin: Negative for rash. Neurological: Negative for headaches, focal weakness or numbness.  10-point ROS otherwise negative.  ____________________________________________   PHYSICAL EXAM:  VITAL SIGNS: ED Triage Vitals  Enc Vitals Group     BP 07/12/14 1855 123/87 mmHg     Pulse Rate 07/12/14 1855 98     Resp 07/12/14 1855 24     Temp 07/12/14 1855 98.6 F (37 C)     Temp Source 07/12/14 1855 Oral     SpO2 07/12/14 1855 96 %  Weight 07/12/14 1855 250 lb (113.399 kg)     Height 07/12/14 1855 5' 2"  (1.575 m)     Head Cir --      Peak Flow --      Pain Score 07/12/14 1855 10     Pain Loc --      Pain Edu? --      Excl. in [place]? --     Constitutional: Alert and oriented.  Appears to be in significant pain/distress. Eyes: Conjunctivae are normal. PERRL. EOMI. Head: Atraumatic. Nose: No congestion/rhinnorhea. Mouth/Throat: Mucous membranes are moist.  Oropharynx non-erythematous. Neck: No stridor.    Cardiovascular: Normal rate, regular rhythm. Grossly normal heart sounds.  Good peripheral circulation. Respiratory: Normal respiratory effort.  No retractions. Lungs CTAB. Gastrointestinal: Obese, soft but tender primarily in the epigastrium and right upper quadrant. Musculoskeletal: No lower extremity tenderness nor edema.  No joint effusions. Neurologic:  Normal speech and language. No gross focal neurologic deficits are appreciated. Speech is normal. Skin:  Skin is warm, dry and intact. No rash noted.   ____________________________________________   LABS (all labs ordered are listed, but only abnormal results are displayed)  Labs Reviewed  COMPREHENSIVE METABOLIC PANEL - Abnormal; Notable for the following:    Glucose, Bld 109 (*)    AST 58 (*)    Total Bilirubin 1.7 (*)    All other components within normal limits  CBC WITH DIFFERENTIAL/PLATELET - Abnormal; Notable for the following:    Platelets 136 (*)    All other components within normal limits  LIPASE, BLOOD  TROPONIN I  CBC  CREATININE, SERUM  BASIC METABOLIC PANEL  CBC  HEMOGLOBIN A1C   ____________________________________________  EKG  ED ECG REPORT I, Kyian Obst, the attending physician, personally viewed and interpreted this ECG.  Date: 07/12/2014 EKG Time: 19:00 Rate: 92 Rhythm: normal sinus rhythm QRS Axis: normal Intervals: normal ST/T Wave abnormalities: Non-specific ST segment / T-wave changes, but no evidence of acute ischemia. Conduction Disutrbances: none Narrative Interpretation: unremarkable  ____________________________________________  RADIOLOGY  US Transvaginal Non-ob  07/10/2014   CLINICAL DATA:  Follow-up of ovarian cysts seen on CT scan of May 29, 2014, history of hysterectomy 28 years ago.  EXAM: TRANSABDOMINAL AND TRANSVAGINAL ULTRASOUND OF PELVIS  TECHNIQUE: Both transabdominal and transvaginal ultrasound examinations of the pelvis were performed. Transabdominal technique was  performed for global imaging of the pelvis including uterus, ovaries, adnexal regions, and pelvic cul-de-sac. It was necessary to proceed with endovaginal exam following the transabdominal exam to visualize the endometrium and adnexal structures.  COMPARISON:  CT scan of the abdomen and pelvis of May 29, 2014.  FINDINGS: Uterus  The uterus is surgically absent.  Right ovary  Measurements: 3.3 x 1.9 x 2.3 cm. There is a septated right ovarian cystic structure which measures 1.8 x 1.5 x 1.7 cm.  Left ovary  Measurements: 4.1 x 2.4 x 2.5 cm. There is an ovarian cyst measuring 1.8 cm in greatest dimension. There is a subtle focus of heterogeneously increased echotexture measuring 3 x 1.7 x 1.9 cm.  Other findings  There is no free pelvic fluid.  IMPRESSION: The uterus is surgically absent. The right ovary contains a cyst measuring 1.8 cm in greatest dimension. On the left the ovary contains a cyst measuring up to 1.8 cm, and there is a complex structure within the left ovary measuring 3 x 1.7 x 1.9 cm. This was not evident on the previous study. Pelvic MRI is recommended.   Electronically Signed  By: David  Martinique M.D.   On: 07/10/2014 13:45   US Pelvis Complete  07/10/2014   CLINICAL DATA:  Follow-up of ovarian cysts seen on CT scan of May 29, 2014, history of hysterectomy 28 years ago.  EXAM: TRANSABDOMINAL AND TRANSVAGINAL ULTRASOUND OF PELVIS  TECHNIQUE: Both transabdominal and transvaginal ultrasound examinations of the pelvis were performed. Transabdominal technique was performed for global imaging of the pelvis including uterus, ovaries, adnexal regions, and pelvic cul-de-sac. It was necessary to proceed with endovaginal exam following the transabdominal exam to visualize the endometrium and adnexal structures.  COMPARISON:  CT scan of the abdomen and pelvis of May 29, 2014.  FINDINGS: Uterus  The uterus is surgically absent.  Right ovary  Measurements: 3.3 x 1.9 x 2.3 cm. There is a septated right ovarian  cystic structure which measures 1.8 x 1.5 x 1.7 cm.  Left ovary  Measurements: 4.1 x 2.4 x 2.5 cm. There is an ovarian cyst measuring 1.8 cm in greatest dimension. There is a subtle focus of heterogeneously increased echotexture measuring 3 x 1.7 x 1.9 cm.  Other findings  There is no free pelvic fluid.  IMPRESSION: The uterus is surgically absent. The right ovary contains a cyst measuring 1.8 cm in greatest dimension. On the left the ovary contains a cyst measuring up to 1.8 cm, and there is a complex structure within the left ovary measuring 3 x 1.7 x 1.9 cm. This was not evident on the previous study. Pelvic MRI is recommended.   Electronically Signed   By: David  Martinique M.D.   On: 07/10/2014 13:45   Dg Abd 2 Views  06/23/2014   CLINICAL DATA:  Right-sided abdominal pain following endoscopy  EXAM: ABDOMEN - 2 VIEW  COMPARISON:  None.  FINDINGS: No free air is noted. Scattered large and small bowel gas is noted. No abnormal mass or abnormal calcifications are seen. Elongated spleen is noted similar to that seen on the prior CT examination.  IMPRESSION: No acute abnormality noted.   Electronically Signed   By: Inez Catalina M.D.   On: 06/23/2014 14:13   US Abdomen Limited Ruq  07/12/2014   CLINICAL DATA:  Right upper quadrant abdominal pain. Elevated bilirubin level.  EXAM: US ABDOMEN LIMITED - RIGHT UPPER QUADRANT  COMPARISON:  Ultrasound of March 27, 2014.  FINDINGS: Gallbladder:  Gallbladder appears to be mildly distended with mild amount of sludge present. No gallstones are noted. No gallbladder wall thickening or pericholecystic fluid is noted. Positive sonographic Percell Miller sign is noted.  Common bile duct:  Diameter: Common bile duct measures 9.1 mm concerning for distal common bile duct obstruction, particularly the stated history of elevated bilirubin level.  Liver:  Increased echogenicity of hepatic parenchyma is noted suggesting fatty infiltration. Stable bilobed 1.9 cm left hepatic cyst is noted.   IMPRESSION: Stable left hepatic cyst.  Increased echogenicity of hepatic parenchyma consistent with fatty infiltration.  Common bile duct is dilated concerning for distal common bile duct obstruction given the history of elevated bilirubin level. MRCP is recommended for further evaluation.  Mild distention of gallbladder is noted with sludge present. Positive sonographic Murphy's sign is noted. HIDA scan is recommended to evaluate for possible cholecystitis.   Electronically Signed   By: Marijo Conception, M.D.   On: 07/12/2014 20:46    ____________________________________________   PROCEDURES  Procedure(s) performed: None  Critical Care performed: No ____________________________________________   INITIAL IMPRESSION / ASSESSMENT AND PLAN / ED COURSE  Pertinent labs & imaging  results that were available during my care of the patient were reviewed by me and considered in my medical decision making (see chart for details).  The patient is in a significant amount of distress at this point.  I will treat her pain with Dilaudid 1 mg IV and her nausea was Zofran 4 mg IV.  The patient is nothing by mouth and I am giving a 1 L normal saline IV bolus.  I will evaluate with a right upper quadrant ultrasound.  ----------------------------------------- 9:04 PM on 07/12/2014 -----------------------------------------  The patient has a dilated common bile duct and a slight elevation of her T bili.  I discussed the case with Dr. Burt Knack, general surgery, who suggested medical admission for choledocholithiasis with ERCP versus MRCP.  He also stated if they consult surgery during her admission then he one of his colleagues will likely proceed with a cholecystectomy.  I have updated the patient and admitted the patient to the hospitalist.there is no evidence of acute cholangitis and there is no indication for antibiotics at this time.  ____________________________________________  FINAL CLINICAL IMPRESSION(S)  / ED DIAGNOSES  Final diagnoses:  Abdominal pain  Choledocholithiasis      NEW MEDICATIONS STARTED DURING THIS VISIT:  Current Discharge Medication List       Hinda Kehr, MD 07/13/14 [phone]

## 2014-07-12 NOTE — ED Notes (Signed)
MD at bedside. 

## 2014-07-12 NOTE — H&P (Signed)
[facility] [place] NAME: Lori Mckenzie    MR#:  [id number]  DATE OF BIRTH:  21-Jan-1965  DATE OF ADMISSION:  07/12/2014  PRIMARY CARE PHYSICIAN: Ashkin, Neldon Labella, MD   REQUESTING/REFERRING PHYSICIAN: Karma Greaser  CHIEF COMPLAINT:   Chief Complaint  Patient presents with  . Abdominal Pain  . Nausea    HISTORY OF PRESENT ILLNESS:  Lori Mckenzie  is a 49 y.o. female who presents with right upper quadrant and epigastric abdominal pain with nausea and vomiting. Patient states that she's been dealing with these symptoms intermittently for the past couple months, and has had workup for the same. Her GI doctor, Dr. Gustavo Lah, has been seeing her for this as well. Today she came to the ED because the pain and nausea and vomiting have gotten significantly worse over the past day or so. Right upper quadrant ultrasound here shows biliary sludge and calm bile duct dilatation. Surgery was contacted by ED physician, and recommended MRCP. Hospitals were called for admission for choledocholithiasis.  PAST MEDICAL HISTORY:   Past Medical History  Diagnosis Date  . Brittle bone disease   . Asthma   . Thyroid disease   . Hypertension   . Back pain   . Diabetes mellitus without complication   . Chronic kidney disease   . Anxiety   . GERD (gastroesophageal reflux disease)   . Sleep apnea   . COPD (chronic obstructive pulmonary disease)   . Hypothyroidism   . Cervical disc disease   . TIA (transient ischemic attack)     PAST SURGICAL HISTORY:   Past Surgical History  Procedure Laterality Date  . Abdominal hysterectomy    . Tubal ligation    . Colonoscopy with propofol N/A 06/23/2014    Procedure: COLONOSCOPY WITH PROPOFOL;  Surgeon: Lollie Sails, MD;  Location: [facility] ENDOSCOPY;  Service: Endoscopy;  Laterality: N/A;  . Esophagogastroduodenoscopy N/A 06/23/2014    Procedure: ESOPHAGOGASTRODUODENOSCOPY (EGD);  Surgeon: Lollie Sails, MD;  Location:  [facility] ENDOSCOPY;  Service: Endoscopy;  Laterality: N/A;    SOCIAL HISTORY:   History  Substance Use Topics  . Smoking status: Never Smoker   . Smokeless tobacco: Not on file  . Alcohol Use: No     Comment: occ    FAMILY HISTORY:   Family History  Problem Relation Age of Onset  . Lung cancer Father   . Ulcers Father   . Heart disease Sister   . Ulcers Sister   . Heart disease Brother     DRUG ALLERGIES:   Allergies  Allergen Reactions  . Hydrocodone Itching    Other reaction(s): ITCHING  . Vicodin [Hydrocodone-Acetaminophen] Rash    MEDICATIONS AT HOME:   Prior to Admission medications   Medication Sig Start Date End Date Taking? Authorizing Provider  albuterol (PROVENTIL HFA;VENTOLIN HFA) 108 (90 BASE) MCG/ACT inhaler Inhale 2 puffs into the lungs every 4 (four) hours as needed for wheezing or shortness of breath.    Historical Provider, MD  alprazolam Duanne Moron) 2 MG tablet Take 2 mg by mouth 3 (three) times daily as needed for anxiety.     Historical Provider, MD  citalopram (CELEXA) 20 MG tablet Take 20 mg by mouth daily.    Historical Provider, MD  clotrimazole (MYCELEX) 10 MG troche Take 10 mg by mouth 4 (four) times daily as needed (mouth dryness).    Historical Provider, MD  fluticasone (FLONASE) 50 MCG/ACT nasal spray Place 1 spray into both  nostrils daily as needed (dryness).     Historical Provider, MD  Fluticasone-Salmeterol (ADVAIR) 100-50 MCG/DOSE AEPB Inhale 1 puff into the lungs 2 (two) times daily.    Historical Provider, MD  furosemide (LASIX) 40 MG tablet Take 80 mg by mouth daily.    Historical Provider, MD  gabapentin (NEURONTIN) 100 MG capsule Take 300 mg by mouth 4 (four) times daily as needed (pain).    Historical Provider, MD  hydrochlorothiazide (HYDRODIURIL) 25 MG tablet Take 25 mg by mouth daily.    Historical Provider, MD  ibuprofen (ADVIL,MOTRIN) 800 MG tablet Take 800 mg by mouth every 8 (eight) hours as needed for moderate pain.     Historical  Provider, MD  ipratropium (ATROVENT HFA) 17 MCG/ACT inhaler Inhale 2 puffs into the lungs every 8 (eight) hours.    Historical Provider, MD  ipratropium-albuterol (DUONEB) 0.5-2.5 (3) MG/3ML SOLN Take 6 mLs by nebulization every 6 (six) hours as needed (SOB).    Historical Provider, MD  metFORMIN (GLUCOPHAGE) 1000 MG tablet Take 1,000 mg by mouth 2 (two) times daily. If blood sugar is higher than 105    Historical Provider, MD  metFORMIN (GLUCOPHAGE) 500 MG tablet Take 500 mg by mouth 2 (two) times daily. If sugar is between 90-105    Historical Provider, MD  methocarbamol (ROBAXIN) 500 MG tablet Take 500 mg by mouth 2 (two) times daily as needed for muscle spasms.  03/26/14   Historical Provider, MD  NON FORMULARY at bedtime. cpap    Historical Provider, MD  omeprazole (PRILOSEC) 20 MG capsule Take 40 mg by mouth 2 (two) times daily as needed (acid reflux).    Historical Provider, MD  Oxycodone HCl 20 MG TABS Take 1 tablet by mouth 2 (two) times daily.    Historical Provider, MD  OXYGEN Inhale into the lungs 4 (four) times daily.    Historical Provider, MD  rizatriptan (MAXALT) 10 MG tablet Take 10 mg by mouth as needed for migraine. May repeat in 2 hours if needed    Historical Provider, MD  spironolactone (ALDACTONE) 50 MG tablet Take 50 mg by mouth daily.    Historical Provider, MD  zolpidem (AMBIEN) 10 MG tablet Take 10 mg by mouth at bedtime as needed for sleep.     Historical Provider, MD    REVIEW OF SYSTEMS:  Review of Systems  Constitutional: Negative for fever, chills, weight loss and malaise/fatigue.  HENT: Negative for ear pain, hearing loss and tinnitus.   Eyes: Negative for blurred vision, double vision, pain and redness.  Respiratory: Negative for cough, hemoptysis and shortness of breath.   Cardiovascular: Negative for chest pain, palpitations, orthopnea and leg swelling.  Gastrointestinal: Positive for nausea, vomiting, abdominal pain and diarrhea. Negative for constipation.   Genitourinary: Negative for dysuria, frequency and hematuria.  Musculoskeletal: Negative for back pain, joint pain and neck pain.  Skin:       No acne, rash, or lesions  Neurological: Negative for dizziness, tremors, focal weakness and weakness.  Endo/Heme/Allergies: Negative for polydipsia. Does not bruise/bleed easily.  Psychiatric/Behavioral: Negative for depression. The patient is not nervous/anxious and does not have insomnia.      VITAL SIGNS:   Filed Vitals:   07/12/14 1855 07/12/14 2050 07/12/14 2100 07/12/14 2130  BP: 123/87 107/73 117/74 109/56  Pulse: 98 52 56 53  Temp: 98.6 F (37 C)     TempSrc: Oral     Resp: 24 20 14 16   Height: 5' 2"  (1.575 m)  Weight: 113.399 kg (250 lb)     SpO2: 96% 96% 97% 96%   Wt Readings from Last 3 Encounters:  07/12/14 113.399 kg (250 lb)  06/23/14 112.492 kg (248 lb)  03/17/11 103.42 kg (228 lb)    PHYSICAL EXAMINATION:  Physical Exam  Constitutional: She is oriented to person, place, and time. She appears well-developed and well-nourished. No distress.  HENT:  Head: Normocephalic and atraumatic.  Mouth/Throat: Oropharynx is clear and moist.  Eyes: Conjunctivae and EOM are normal. Pupils are equal, round, and reactive to light. No scleral icterus.  Neck: Normal range of motion. Neck supple. No JVD present. No thyromegaly present.  Cardiovascular: Normal rate, regular rhythm and intact distal pulses.  Exam reveals no gallop and no friction rub.   No murmur heard. Respiratory: Effort normal and breath sounds normal. No respiratory distress. She has no wheezes. She has no rales.  GI: Soft. Bowel sounds are normal. She exhibits no distension. There is tenderness (right upper quadrant and epigastric).  Musculoskeletal: Normal range of motion. She exhibits no edema.  No arthritis, no gout  Lymphadenopathy:    She has no cervical adenopathy.  Neurological: She is alert and oriented to person, place, and time. No cranial nerve  deficit.  No dysarthria, no aphasia  Skin: Skin is warm and dry. No rash noted. No erythema.  Psychiatric: She has a normal mood and affect. Her behavior is normal. Judgment and thought content normal.    LABORATORY PANEL:   CBC  Recent Labs Lab 07/12/14 1857  WBC 8.2  HGB 14.3  HCT 41.0  PLT 136*   ------------------------------------------------------------------------------------------------------------------  Chemistries   Recent Labs Lab 07/12/14 1857  NA 139  K 3.6  CL 103  CO2 23  GLUCOSE 109*  BUN 12  CREATININE 0.69  CALCIUM 9.5  AST 58*  ALT 39  ALKPHOS 45  BILITOT 1.7*   ------------------------------------------------------------------------------------------------------------------  Cardiac Enzymes  Recent Labs Lab 07/12/14 1857  TROPONINI <0.03   ------------------------------------------------------------------------------------------------------------------  RADIOLOGY:  US Abdomen Limited Ruq  07/12/2014   CLINICAL DATA:  Right upper quadrant abdominal pain. Elevated bilirubin level.  EXAM: US ABDOMEN LIMITED - RIGHT UPPER QUADRANT  COMPARISON:  Ultrasound of March 27, 2014.  FINDINGS: Gallbladder:  Gallbladder appears to be mildly distended with mild amount of sludge present. No gallstones are noted. No gallbladder wall thickening or pericholecystic fluid is noted. Positive sonographic Percell Miller sign is noted.  Common bile duct:  Diameter: Common bile duct measures 9.1 mm concerning for distal common bile duct obstruction, particularly the stated history of elevated bilirubin level.  Liver:  Increased echogenicity of hepatic parenchyma is noted suggesting fatty infiltration. Stable bilobed 1.9 cm left hepatic cyst is noted.  IMPRESSION: Stable left hepatic cyst.  Increased echogenicity of hepatic parenchyma consistent with fatty infiltration.  Common bile duct is dilated concerning for distal common bile duct obstruction given the history of elevated  bilirubin level. MRCP is recommended for further evaluation.  Mild distention of gallbladder is noted with sludge present. Positive sonographic Murphy's sign is noted. HIDA scan is recommended to evaluate for possible cholecystitis.   Electronically Signed   By: Marijo Conception, M.D.   On: 07/12/2014 20:46    EKG:   Orders placed or performed during the hospital encounter of 07/12/14  . ED EKG   (only if pt is 51 y.o. or older and pain is above umbilicus)  . ED EKG   (only if pt is 51 y.o. or older and pain  is above umbilicus)    IMPRESSION AND PLAN:  Principal Problem:   Choledocholithiasis - with ultrasound imaging showing common bile duct dilatation and biliary sludge. We will admit with pain control and antiemetics for nausea and vomiting control. Get MRCP, and then determine if she needs surgical consult or GI consult for ERCP. Active Problems:   Type 2 diabetes mellitus - sliding scale insulin with fingerstick glucose checks every 6 hours for now while nothing by mouth, check hemoglobin A1c.   COPD (chronic obstructive pulmonary disease) - continue home inhalers.   HTN (hypertension) - controlled, continue home meds.   OSA on CPAP - CPAP daily at bedtime while here   GERD (gastroesophageal reflux disease) - equivalent home dose PPI   Anxiety - home medications  All the records are reviewed and case discussed with ED provider. Management plans discussed with the patient and/or family.  DVT PROPHYLAXIS: SubQ lovenox  ADMISSION STATUS: Inpatient  CODE STATUS: Full  TOTAL TIME TAKING CARE OF THIS PATIENT: 45 minutes.    Jimi Schappert [place] 07/12/2014, 10:30 PM  [facility]  Office  [phone]  CC: Primary care physician; Ashkin, Neldon Labella, MD

## 2014-07-13 ENCOUNTER — Inpatient Hospital Stay: Payer: Medicaid Other

## 2014-07-13 LAB — HEPATIC FUNCTION PANEL
ALT: 38 U/L (ref 14–54)
AST: 58 U/L — ABNORMAL HIGH (ref 15–41)
Albumin: 4.4 g/dL (ref 3.5–5.0)
Alkaline Phosphatase: 41 U/L (ref 38–126)
Bilirubin, Direct: 0.3 mg/dL (ref 0.1–0.5)
Indirect Bilirubin: 1.2 mg/dL — ABNORMAL HIGH (ref 0.3–0.9)
Total Bilirubin: 1.5 mg/dL — ABNORMAL HIGH (ref 0.3–1.2)
Total Protein: 7.8 g/dL (ref 6.5–8.1)

## 2014-07-13 LAB — CBC
HCT: 37.3 % (ref 35.0–47.0)
Hemoglobin: 12.5 g/dL (ref 12.0–16.0)
MCH: 31.7 pg (ref 26.0–34.0)
MCHC: 33.4 g/dL (ref 32.0–36.0)
MCV: 94.7 fL (ref 80.0–100.0)
Platelets: 106 10*3/uL — ABNORMAL LOW (ref 150–440)
RBC: 3.93 MIL/uL (ref 3.80–5.20)
RDW: 13.6 % (ref 11.5–14.5)
WBC: 6.6 10*3/uL (ref 3.6–11.0)

## 2014-07-13 LAB — BASIC METABOLIC PANEL
Anion gap: 5 (ref 5–15)
BUN: 12 mg/dL (ref 6–20)
CO2: 25 mmol/L (ref 22–32)
Calcium: 8.4 mg/dL — ABNORMAL LOW (ref 8.9–10.3)
Chloride: 108 mmol/L (ref 101–111)
Creatinine, Ser: 0.7 mg/dL (ref 0.44–1.00)
GFR calc Af Amer: >60 mL/min (ref >60)
GFR calc non Af Amer: >60 mL/min (ref >60)
Glucose, Bld: 82 mg/dL (ref 65–99)
Potassium: 3.8 mmol/L (ref 3.5–5.1)
Sodium: 138 mmol/L (ref 135–145)

## 2014-07-13 LAB — GLUCOSE, CAPILLARY
Glucose-Capillary: 120 mg/dL — ABNORMAL HIGH (ref 65–99)
Glucose-Capillary: 81 mg/dL (ref 65–99)
Glucose-Capillary: 82 mg/dL (ref 65–99)
Glucose-Capillary: 83 mg/dL (ref 65–99)

## 2014-07-13 MED ORDER — GADOBENATE DIMEGLUMINE 529 MG/ML IV SOLN
20.0000 mL | Freq: Once | INTRAVENOUS | Status: AC | PRN
  Administered 2014-07-13: 20 mL via INTRAVENOUS

## 2014-07-13 MED ORDER — HYDROMORPHONE HCL 1 MG/ML IJ SOLN
1.0000 mg | Freq: Once | INTRAMUSCULAR | Status: AC
  Administered 2014-07-13: 1 mg via INTRAVENOUS
  Filled 2014-07-13: qty 1

## 2014-07-13 MED ORDER — KETOROLAC TROMETHAMINE 30 MG/ML IJ SOLN
30.0000 mg | Freq: Once | INTRAMUSCULAR | Status: AC
  Administered 2014-07-13: 30 mg via INTRAVENOUS
  Filled 2014-07-13: qty 1

## 2014-07-13 MED ORDER — ONDANSETRON HCL 4 MG/2ML IJ SOLN
4.0000 mg | INTRAMUSCULAR | Status: DC | PRN
  Administered 2014-07-13 – 2014-07-16 (×10): 4 mg via INTRAVENOUS
  Filled 2014-07-13 (×9): qty 2

## 2014-07-13 NOTE — Progress Notes (Signed)
Notified Dr Jannifer Franklin of pt request for migraine medication.

## 2014-07-13 NOTE — Progress Notes (Signed)
Spoke to Dr. Posey Pronto about pt still having bad pain even an hour after meds given, asking for one time dose before she goes for MRCP.  MD gave the order.

## 2014-07-13 NOTE — Consult Note (Signed)
GI Inpatient Consult Note  Reason for Consult: abd pain, dilated CBD.    Attending Requesting Consult: Serita Grit  History of Present Illness: Machelle Raybon is a 49 y.o. female with past medical history notable for COPD, TIA, OSA, DM presenting for evaluation of abdominal pain and weight loss. Mrs. Lori Mckenzie reports that she's had ongoing abdominal pain for the past several months. The pain is epigastric and is worse after eating. The pain does travel through to her back. Both a sharp stabbing and a crampy pain in nature. It is worse with fatty foods. The pain is also associated with nausea and vomiting.  She has seen Dr. Gustavo Lah for the symptoms and had a workup with EGD and colonoscopy. The etiology of the pain was thought to be due to gallstones and she was referred to Dr. Tamala Mckenzie for possible cholecystectomy. However the pain became severe the day before presentation and she presented to the emergency room. She has not yet been seen in surgical constant consultation with Dr. Tamala Mckenzie.   In the emergency room she had a right upper quadrant ultrasound that showed sludge in the gallbladder and a mildly dilated bile duct. Here in hospital, she continues to have ongoing epigastric pain. It is somewhat improved with IV pain medication. She denies melena, dysphagia, fevers or chills. She did have rectal bleeding about a month ago and none recently.  Past Medical History:  Past Medical History  Diagnosis Date  . Brittle bone disease   . Asthma   . Thyroid disease   . Hypertension   . Back pain   . Diabetes mellitus without complication   . Chronic kidney disease   . Anxiety   . GERD (gastroesophageal reflux disease)   . Sleep apnea   . COPD (chronic obstructive pulmonary disease)   . Hypothyroidism   . Cervical disc disease   . TIA (transient ischemic attack)     Problem List: Patient Active Problem List   Diagnosis Date Noted  . Choledocholithiasis 07/12/2014  . Type 2 diabetes mellitus  07/12/2014  . COPD (chronic obstructive pulmonary disease) 07/12/2014  . HTN (hypertension) 07/12/2014  . GERD (gastroesophageal reflux disease) 07/12/2014  . OSA on CPAP 07/12/2014  . Anxiety 07/12/2014    Past Surgical History: Past Surgical History  Procedure Laterality Date  . Abdominal hysterectomy    . Tubal ligation    . Colonoscopy with propofol N/A 06/23/2014    Procedure: COLONOSCOPY WITH PROPOFOL;  Surgeon: Lollie Sails, MD;  Location: [facility] ENDOSCOPY;  Service: Endoscopy;  Laterality: N/A;  . Esophagogastroduodenoscopy N/A 06/23/2014    Procedure: ESOPHAGOGASTRODUODENOSCOPY (EGD);  Surgeon: Lollie Sails, MD;  Location: [facility] ENDOSCOPY;  Service: Endoscopy;  Laterality: N/A;    Allergies: Allergies  Allergen Reactions  . Hydrocodone Itching    Other reaction(s): ITCHING  . Vicodin [Hydrocodone-Acetaminophen] Rash    Home Medications: Prescriptions prior to admission  Medication Sig Dispense Refill Last Dose  . albuterol (PROVENTIL HFA;VENTOLIN HFA) 108 (90 BASE) MCG/ACT inhaler Inhale 2 puffs into the lungs every 4 (four) hours as needed for wheezing or shortness of breath.   07/12/2014  . alprazolam (XANAX) 2 MG tablet Take 2 mg by mouth 3 (three) times daily as needed for anxiety.    07/12/2014  . citalopram (CELEXA) 20 MG tablet Take 20 mg by mouth daily.   07/12/2014  . clotrimazole (MYCELEX) 10 MG troche Take 10 mg by mouth 4 (four) times daily as needed (mouth dryness).   Past Week  .  fluticasone (FLONASE) 50 MCG/ACT nasal spray Place 1 spray into both nostrils daily as needed (dryness).    Past Week  . Fluticasone-Salmeterol (ADVAIR) 100-50 MCG/DOSE AEPB Inhale 1 puff into the lungs 2 (two) times daily.     . furosemide (LASIX) 40 MG tablet Take 80 mg by mouth daily.   07/11/2014  . gabapentin (NEURONTIN) 100 MG capsule Take 300 mg by mouth 4 (four) times daily as needed (pain).   07/13/2014  . hydrochlorothiazide (HYDRODIURIL) 25 MG tablet Take 25 mg by mouth daily.      Marland Kitchen ipratropium (ATROVENT HFA) 17 MCG/ACT inhaler Inhale 2 puffs into the lungs every 8 (eight) hours.     Marland Kitchen ipratropium-albuterol (DUONEB) 0.5-2.5 (3) MG/3ML SOLN Take 6 mLs by nebulization every 6 (six) hours as needed (SOB).     . metFORMIN (GLUCOPHAGE) 1000 MG tablet Take 1,000 mg by mouth 2 (two) times daily. If blood sugar is higher than 105     . methocarbamol (ROBAXIN) 500 MG tablet Take 500 mg by mouth 2 (two) times daily as needed for muscle spasms.      . NON FORMULARY at bedtime. cpap     . omeprazole (PRILOSEC) 20 MG capsule Take 40 mg by mouth 2 (two) times daily as needed (acid reflux).   07/12/2014  . Oxycodone HCl 20 MG TABS Take 1 tablet by mouth 2 (two) times daily.   Past Week  . rizatriptan (MAXALT) 10 MG tablet Take 10 mg by mouth as needed for migraine. May repeat in 2 hours if needed   Past Week  . zolpidem (AMBIEN) 10 MG tablet Take 10 mg by mouth at bedtime as needed for sleep.    07/12/2014  . ibuprofen (ADVIL,MOTRIN) 800 MG tablet Take 800 mg by mouth every 8 (eight) hours as needed for moderate pain.    03/17/2011 at Unknown  . metFORMIN (GLUCOPHAGE) 500 MG tablet Take 500 mg by mouth 2 (two) times daily. If sugar is between 90-105   07/11/2014  . OXYGEN Inhale into the lungs 4 (four) times daily.     Marland Kitchen spironolactone (ALDACTONE) 50 MG tablet Take 50 mg by mouth daily.      Home medication reconciliation was completed with the patient.   Scheduled Inpatient Medications:   . citalopram  20 mg Oral Daily  . furosemide  80 mg Oral Daily  . insulin aspart  0-9 Units Subcutaneous Q6H  . mometasone-formoterol  2 puff Inhalation BID  . pantoprazole  40 mg Oral BID  . spironolactone  50 mg Oral Daily    Continuous Inpatient Infusions:     PRN Inpatient Medications:  acetaminophen **OR** acetaminophen, albuterol, alprazolam, gabapentin, HYDROmorphone (DILAUDID) injection, ondansetron (ZOFRAN) IV, zolpidem  Family History: family history includes Heart disease in her  brother and sister; Lung cancer in her father; Ulcers in her father and sister.     Social History:   reports that she has never smoked. She does not have any smokeless tobacco history on file. She reports that she does not drink alcohol or use illicit drugs.   Review of Systems: Constitutional: Weight is down Eyes: No changes in vision. ENT: No oral lesions, sore throat.  GI: see HPI.  Heme/Lymph: + easy bruising CV: +c/p GU: No hematuria.  Integumentary: No rashes.  Neuro: no headache Psych: No depression/anxiety.  Endocrine: No heat/cold intolerance.  Allergic/Immunologic: No urticaria.  Resp: + cough, + SOB (from COPD) Musculoskeletal: No joint swelling.    Physical Examination: BP 111/64  mmHg  Pulse 50  Temp(Src) 97.8 F (36.6 C) (Oral)  Resp 16  Ht 5' 2"  (1.575 m)  Wt 113.399 kg (250 lb)  BMI 45.71 kg/m2  SpO2 99% Gen: NAD, alert and oriented x 4 HEENT: PEERLA, EOMI, Neck: supple, no JVD or thyromegaly Chest: decreased breath sounds bilaterally, no wheezes, crackles, or other adventitious sounds CV: RRR, no m/g/c/r Abd:+ diffuse TTP, worse in epigastric region, no r/g, nabs.  Ext: no edema, well perfused with 2+ pulses, Skin: no rash or lesions noted Lymph: no LAD  Data: Lab Results  Component Value Date   WBC 6.6 07/13/2014   HGB 12.5 07/13/2014   HCT 37.3 07/13/2014   MCV 94.7 07/13/2014   PLT 106* 07/13/2014    Recent Labs Lab 07/12/14 1857 07/13/14 0443  HGB 14.3 12.5   Lab Results  Component Value Date   NA 138 07/13/2014   K 3.8 07/13/2014   CL 108 07/13/2014   CO2 25 07/13/2014   BUN 12 07/13/2014   CREATININE 0.70 07/13/2014   Lab Results  Component Value Date   ALT 39 07/12/2014   AST 58* 07/12/2014   ALKPHOS 45 07/12/2014   BILITOT 1.7* 07/12/2014   No results for input(s): APTT, INR, PTT in the last 168 hours.   Assessment/Plan: [name] is a 49 y.o. female  with 2 months of epigastric pain and weight loss. She has  sludge in her gallbladder and a mildly dilated common bile duct on ultrasound today. Her pain does sound biliary in nature. I do agree with MRCP to evaluate for common bile duct stone given the dilated common bile duct and the mildly elevated total bilirubin. If the MRCP is negative for common bile duct stone I would recommend a surgical consult for consideration of cholecystectomy. We will also fractionate the bilirubin.   Thank you for the consult. Please call with questions or concerns.  REIN, Grace Blight, MD

## 2014-07-13 NOTE — Progress Notes (Addendum)
[facility] [place] NAME: Lori Mckenzie    MR#:  [id number]  DATE OF BIRTH:  1965-07-03  SUBJECTIVE:  Came in with right UQ pain  Nausea+  REVIEW OF SYSTEMS:   Review of Systems  Constitutional: Negative for fever, chills and weight loss.  HENT: Negative for ear discharge, ear pain and nosebleeds.   Eyes: Negative for blurred vision, pain and discharge.  Respiratory: Positive for shortness of breath. Negative for sputum production, wheezing and stridor.   Cardiovascular: Negative for chest pain, palpitations, orthopnea and PND.  Gastrointestinal: Positive for nausea and abdominal pain. Negative for vomiting and diarrhea.  Genitourinary: Negative for urgency and frequency.  Musculoskeletal: Negative for back pain and joint pain.  Neurological: Negative for sensory change, speech change, focal weakness and weakness.  Psychiatric/Behavioral: Negative for depression and hallucinations. The patient is nervous/anxious.   All other systems reviewed and are negative.  Tolerating Diet:npo  DRUG ALLERGIES:   Allergies  Allergen Reactions  . Hydrocodone Itching    Other reaction(s): ITCHING  . Vicodin [Hydrocodone-Acetaminophen] Rash    VITALS:  Blood pressure 111/64, pulse 50, temperature 97.8 F (36.6 C), temperature source Oral, resp. rate 16, height 5' 2"  (1.575 m), weight 113.399 kg (250 lb), SpO2 99 %.  PHYSICAL EXAMINATION:   Physical Exam  GENERAL:  49 y.o.-year-old patient lying in the bed with no acute distress.  EYES: Pupils equal, round, reactive to light and accommodation. No scleral icterus. Extraocular muscles intact.  HEENT: Head atraumatic, normocephalic. Oropharynx and nasopharynx clear.  NECK:  Supple, no jugular venous distention. No thyroid enlargement, no tenderness.  LUNGS: Normal breath sounds bilaterally, no wheezing, rales, rhonchi. No use of accessory muscles of respiration.  CARDIOVASCULAR: S1, S2 normal.  No murmurs, rubs, or gallops.  ABDOMEN: Soft, nontender, nondistended. Bowel sounds present. No organomegaly or mass.  EXTREMITIES: No cyanosis, clubbing or edema b/l.    NEUROLOGIC: Cranial nerves II through XII are intact. No focal Motor or sensory deficits b/l.   PSYCHIATRIC: The patient is alert and oriented x 3.  SKIN: No obvious rash, lesion, or ulcer.    LABORATORY PANEL:   CBC  Recent Labs Lab 07/13/14 0443  WBC 6.6  HGB 12.5  HCT 37.3  PLT 106*    Chemistries   Recent Labs Lab 07/12/14 1857 07/13/14 0443  NA 139 138  K 3.6 3.8  CL 103 108  CO2 23 25  GLUCOSE 109* 82  BUN 12 12  CREATININE 0.69 0.70  CALCIUM 9.5 8.4*  AST 58*  --   ALT 39  --   ALKPHOS 45  --   BILITOT 1.7*  --     Cardiac Enzymes  Recent Labs Lab 07/12/14 1857  TROPONINI <0.03    RADIOLOGY:  US Abdomen Limited Ruq  07/12/2014   CLINICAL DATA:  Right upper quadrant abdominal pain. Elevated bilirubin level.  EXAM: US ABDOMEN LIMITED - RIGHT UPPER QUADRANT  COMPARISON:  Ultrasound of March 27, 2014.  FINDINGS: Gallbladder:  Gallbladder appears to be mildly distended with mild amount of sludge present. No gallstones are noted. No gallbladder wall thickening or pericholecystic fluid is noted. Positive sonographic Percell Miller sign is noted.  Common bile duct:  Diameter: Common bile duct measures 9.1 mm concerning for distal common bile duct obstruction, particularly the stated history of elevated bilirubin level.  Liver:  Increased echogenicity of hepatic parenchyma is noted suggesting fatty infiltration. Stable bilobed 1.9 cm left hepatic cyst is  noted.  IMPRESSION: Stable left hepatic cyst.  Increased echogenicity of hepatic parenchyma consistent with fatty infiltration.  Common bile duct is dilated concerning for distal common bile duct obstruction given the history of elevated bilirubin level. MRCP is recommended for further evaluation.  Mild distention of gallbladder is noted with sludge present.  Positive sonographic Murphy's sign is noted. HIDA scan is recommended to evaluate for possible cholecystitis.   Electronically Signed   By: Marijo Conception, M.D.   On: 07/12/2014 20:46     ASSESSMENT AND PLAN:  49 y.o. female who presents with right upper quadrant and epigastric abdominal pain with nausea and vomiting. Patient states that she's been dealing with these symptoms intermittently for the past couple months, and has had workup for the same.  * right UQ pain with intractable nausea and vomitng and abnormal USG abdomen with possible Choledocholithiasis  -ultrasound imaging showing common bile duct dilatation and biliary sludge.  - pain control and antiemetics for nausea and vomiting control.  -Get MRCP, and then determine if she needs surgical consult or GI consult for ERCP.  *  Type 2 diabetes mellitus - sliding scale insulin with fingerstick glucose checks every 6 hours for now while nothing by mouth, check hemoglobin A1c.  * COPD (chronic obstructive pulmonary disease) - continue home inhalers.  * HTN (hypertension) - controlled, continue home meds.  * OSA on CPAP - CPAP daily at bedtime   * GERD (gastroesophageal reflux disease) - equivalent home dose PPI  * Anxiety - home medications  *?TCP Hold off antiplt agents  Case discussed with Dr Rayann Heman GI Management plans discussed with the patient, family and they are in agreement.  CODE STATUS: full  DVT Prophylaxis: TEDs and SCD due to low plt  TOTAL TIME TAKING CARE OF THIS PATIENT: 35 minutes.  >50% time spent on counselling and coordination of care  Fonnie Crookshanks M.D on 07/13/2014 at 1:26 PM  Between 7am to 6pm - Pager - [phone]  After 6pm go to [email or web address] - password [email or web address] [facility]  Office  [phone]  CC: Primary care physician; Ashkin, Neldon Labella, MD

## 2014-07-14 ENCOUNTER — Encounter: Payer: Self-pay | Admitting: Surgery

## 2014-07-14 DIAGNOSIS — R1011 Right upper quadrant pain: Secondary | ICD-10-CM

## 2014-07-14 DIAGNOSIS — K8050 Calculus of bile duct without cholangitis or cholecystitis without obstruction: Secondary | ICD-10-CM | POA: Diagnosis present

## 2014-07-14 LAB — GLUCOSE, CAPILLARY
Glucose-Capillary: 101 mg/dL — ABNORMAL HIGH (ref 65–99)
Glucose-Capillary: 104 mg/dL — ABNORMAL HIGH (ref 65–99)
Glucose-Capillary: 127 mg/dL — ABNORMAL HIGH (ref 65–99)
Glucose-Capillary: 86 mg/dL (ref 65–99)
Glucose-Capillary: 89 mg/dL (ref 65–99)
Glucose-Capillary: 96 mg/dL (ref 65–99)

## 2014-07-14 LAB — HEMOGLOBIN A1C: Hgb A1c MFr Bld: 5.7 % (ref 4.0–6.0)

## 2014-07-14 MED ORDER — ENOXAPARIN SODIUM 40 MG/0.4ML ~~LOC~~ SOLN: 40.0000 mg | SUBCUTANEOUS | Status: DC

## 2014-07-14 MED ORDER — BOOST / RESOURCE BREEZE PO LIQD
1.0000 | Freq: Three times a day (TID) | ORAL | Status: DC
Start: 2014-07-14 — End: 2014-07-16
  Administered 2014-07-14 – 2014-07-15 (×2): 1 via ORAL

## 2014-07-14 MED ORDER — CEFAZOLIN SODIUM 1-5 GM-% IV SOLN
1.0000 g | INTRAVENOUS | Status: AC
  Administered 2014-07-15: 1 g via INTRAVENOUS
  Filled 2014-07-14: qty 50

## 2014-07-14 MED ORDER — CEFAZOLIN (ANCEF) 1 G IV SOLR
1.0000 g | INTRAVENOUS | Status: DC
  Filled 2014-07-14: qty 1

## 2014-07-14 NOTE — Consult Note (Signed)
Patient ID: Lori Mckenzie, female   DOB: 1965/06/04, 49 y.o.   MRN: [id number]  Chief Complaint  Patient presents with  . Abdominal Pain  . Nausea    HPI  Lori Mckenzie is a 49 y.o. female.  Admitted to the hospital service this week and with the neck second epigastric and right upper quadrant abdominal pain following a fatty meal along with nausea and vomiting. The patient is been under the care of Dr. Gustavo Lah for the last several months with workup of abdominal pain including an upper and lower endoscopy. Ultrasonography at that point has been unrevealing. The patient had an extensive workup. She has had an abdominal ultrasound and vaginal ultrasound CT scan of the abdomen and MRCP.   Workup has revealed biliary sludge. Bile duct measures 9 mm on MRCP however no choledocholithiasis is noted. Since her admission the patient had continued abdominal pain and mild nausea. She has been seen by gastroenterology. Surgical services were asked to evaluate her for cholecystectomy.  Of note the patient's had a prior vaginal hysterectomy for what she describes as endometrial cancer 28 years ago.  HPI  Past Medical History  Diagnosis Date  . Brittle bone disease   . Asthma   . Thyroid disease   . Hypertension   . Back pain   . Diabetes mellitus without complication   . Chronic kidney disease   . Anxiety   . GERD (gastroesophageal reflux disease)   . Sleep apnea   . COPD (chronic obstructive pulmonary disease)   . Hypothyroidism   . Cervical disc disease   . TIA (transient ischemic attack)     Past Surgical History  Procedure Laterality Date  . Abdominal hysterectomy    . Tubal ligation    . Colonoscopy with propofol N/A 06/23/2014    Procedure: COLONOSCOPY WITH PROPOFOL;  Surgeon: Lollie Sails, MD;  Location: [facility] ENDOSCOPY;  Service: Endoscopy;  Laterality: N/A;  . Esophagogastroduodenoscopy N/A 06/23/2014    Procedure: ESOPHAGOGASTRODUODENOSCOPY (EGD);  Surgeon: Lollie Sails, MD;   Location: [facility] ENDOSCOPY;  Service: Endoscopy;  Laterality: N/A;    Family History  Problem Relation Age of Onset  . Lung cancer Father   . Ulcers Father   . Heart disease Sister   . Ulcers Sister   . Heart disease Brother     Social History History  Substance Use Topics  . Smoking status: Never Smoker   . Smokeless tobacco: Not on file  . Alcohol Use: No     Comment: occ    Allergies  Allergen Reactions  . Hydrocodone Itching    Other reaction(s): ITCHING  . Vicodin [Hydrocodone-Acetaminophen] Rash    Current Facility-Administered Medications  Medication Dose Route Frequency Provider Last Rate Last Dose  . acetaminophen (TYLENOL) tablet 650 mg  650 mg Oral Q6H PRN Lance Coon, MD       Or  . acetaminophen (TYLENOL) suppository 650 mg  650 mg Rectal Q6H PRN Lance Coon, MD      . albuterol (PROVENTIL) (2.5 MG/3ML) 0.083% nebulizer solution 3 mL  3 mL Inhalation Q4H PRN Lance Coon, MD      . ALPRAZolam Duanne Moron) tablet 2 mg  2 mg Oral TID PRN Lance Coon, MD   1 mg at 07/14/14 1005  . ceFAZolin (ANCEF) powder 1 g  1 g Other To OR Sherri Rad, MD      . citalopram (CELEXA) tablet 20 mg  20 mg Oral Daily Lance Coon, MD   20 mg at  07/14/14 0932  . enoxaparin (LOVENOX) injection 40 mg  40 mg Subcutaneous Q24H Sherri Rad, MD      . feeding supplement (RESOURCE BREEZE) (RESOURCE BREEZE) liquid 1 Container  1 Container Oral TID WC Vipul Shah, MD      . furosemide (LASIX) tablet 80 mg  80 mg Oral Daily Lance Coon, MD   80 mg at 07/14/14 0932  . gabapentin (NEURONTIN) capsule 300 mg  300 mg Oral QID PRN Lance Coon, MD      . HYDROmorphone (DILAUDID) injection 1 mg  1 mg Intravenous Q3H PRN Lance Coon, MD   1 mg at 07/14/14 1314  . insulin aspart (novoLOG) injection 0-9 Units  0-9 Units Subcutaneous Q6H Lance Coon, MD   0 Units at 07/13/14 0040  . mometasone-formoterol (DULERA) 100-5 MCG/ACT inhaler 2 puff  2 puff Inhalation BID Lance Coon, MD   2 puff at 07/14/14 0800  .  ondansetron (ZOFRAN) injection 4 mg  4 mg Intravenous Q4H PRN Fritzi Mandes, MD   4 mg at 07/14/14 1004  . pantoprazole (PROTONIX) EC tablet 40 mg  40 mg Oral BID Lance Coon, MD   40 mg at 07/14/14 0932  . spironolactone (ALDACTONE) tablet 50 mg  50 mg Oral Daily Lance Coon, MD   50 mg at 07/14/14 0932  . zolpidem (AMBIEN) tablet 5 mg  5 mg Oral QHS PRN Lance Coon, MD   5 mg at 07/13/14 9622      Review of Systems A 10 point review of systems was asked and was negative except for the following positive findings as described in history of present illness.  Blood pressure 95/57, pulse 56, temperature 98.1 F (36.7 C), temperature source Oral, resp. rate 14, height _0  (1.575 m), weight 113.399 kg (250 lb), SpO2 99 %.  Results for orders placed or performed during the hospital encounter of 07/12/14 (from the past 48 hour(s))  Lipase, blood     Status: None   Collection Time: 07/12/14  6:57 PM  Result Value Ref Range   Lipase 28 22 - 51 U/L  Comprehensive metabolic panel     Status: Abnormal   Collection Time: 07/12/14  6:57 PM  Result Value Ref Range   Sodium 139 135 - 145 mmol/L   Potassium 3.6 3.5 - 5.1 mmol/L   Chloride 103 101 - 111 mmol/L   CO2 23 22 - 32 mmol/L   Glucose, Bld 109 (H) 65 - 99 mg/dL   BUN 12 6 - 20 mg/dL   Creatinine, Ser 0.69 0.44 - 1.00 mg/dL   Calcium 9.5 8.9 - 10.3 mg/dL   Total Protein 8.1 6.5 - 8.1 g/dL   Albumin 4.4 3.5 - 5.0 g/dL   AST 58 (H) 15 - 41 U/L   ALT 39 14 - 54 U/L   Alkaline Phosphatase 45 38 - 126 U/L   Total Bilirubin 1.7 (H) 0.3 - 1.2 mg/dL   GFR calc non Af Amer >60 >60 mL/min   GFR calc Af Amer >60 >60 mL/min    Comment: (NOTE) The eGFR has been calculated using the CKD EPI equation. This calculation has not been validated in all clinical situations. eGFR's persistently <60 mL/min signify possible Chronic Kidney Disease.    Anion gap 13 5 - 15  CBC with Differential     Status: Abnormal   Collection Time: 07/12/14  6:57 PM   Result Value Ref Range   WBC 8.2 3.6 - 11.0 K/uL   RBC 4.38  3.80 - 5.20 MIL/uL   Hemoglobin 14.3 12.0 - 16.0 g/dL   HCT 41.0 35.0 - 47.0 %   MCV 93.6 80.0 - 100.0 fL   MCH 32.6 26.0 - 34.0 pg   MCHC 34.8 32.0 - 36.0 g/dL   RDW 13.5 11.5 - 14.5 %   Platelets 136 (L) 150 - 440 K/uL   Neutrophils Relative % 66 %   Neutro Abs 5.5 1.4 - 6.5 K/uL   Lymphocytes Relative 28 %   Lymphs Abs 2.3 1.0 - 3.6 K/uL   Monocytes Relative 4 %   Monocytes Absolute 0.3 0.2 - 0.9 K/uL   Eosinophils Relative 1 %   Eosinophils Absolute 0.0 0 - 0.7 K/uL   Basophils Relative 1 %   Basophils Absolute 0.1 0 - 0.1 K/uL  Troponin I  (only if pt is 51 y.o. or older and pain is above umbilicus)     Status: None   Collection Time: 07/12/14  6:57 PM  Result Value Ref Range   Troponin I <0.03 <0.031 ng/mL    Comment:        NO INDICATION OF MYOCARDIAL INJURY.   Hemoglobin A1c     Status: None   Collection Time: 07/12/14  6:57 PM  Result Value Ref Range   Hgb A1c MFr Bld 5.7 4.0 - 6.0 %  Glucose, capillary     Status: None   Collection Time: 07/13/14 12:44 AM  Result Value Ref Range   Glucose-Capillary 82 65 - 99 mg/dL  Glucose, capillary     Status: None   Collection Time: 07/13/14  4:36 AM  Result Value Ref Range   Glucose-Capillary 81 65 - 99 mg/dL  Basic metabolic panel     Status: Abnormal   Collection Time: 07/13/14  4:43 AM  Result Value Ref Range   Sodium 138 135 - 145 mmol/L   Potassium 3.8 3.5 - 5.1 mmol/L   Chloride 108 101 - 111 mmol/L   CO2 25 22 - 32 mmol/L   Glucose, Bld 82 65 - 99 mg/dL   BUN 12 6 - 20 mg/dL   Creatinine, Ser 0.70 0.44 - 1.00 mg/dL   Calcium 8.4 (L) 8.9 - 10.3 mg/dL   GFR calc non Af Amer >60 >60 mL/min   GFR calc Af Amer >60 >60 mL/min    Comment: (NOTE) The eGFR has been calculated using the CKD EPI equation. This calculation has not been validated in all clinical situations. eGFR's persistently <60 mL/min signify possible Chronic Kidney Disease.    Anion  gap 5 5 - 15  CBC     Status: Abnormal   Collection Time: 07/13/14  4:43 AM  Result Value Ref Range   WBC 6.6 3.6 - 11.0 K/uL   RBC 3.93 3.80 - 5.20 MIL/uL   Hemoglobin 12.5 12.0 - 16.0 g/dL   HCT 37.3 35.0 - 47.0 %   MCV 94.7 80.0 - 100.0 fL   MCH 31.7 26.0 - 34.0 pg   MCHC 33.4 32.0 - 36.0 g/dL   RDW 13.6 11.5 - 14.5 %   Platelets 106 (L) 150 - 440 K/uL  Glucose, capillary     Status: None   Collection Time: 07/13/14 11:55 AM  Result Value Ref Range   Glucose-Capillary 83 65 - 99 mg/dL  Hepatic function panel     Status: Abnormal   Collection Time: 07/13/14  4:35 PM  Result Value Ref Range   Total Protein 7.8 6.5 - 8.1 g/dL   Albumin  4.4 3.5 - 5.0 g/dL   AST 58 (H) 15 - 41 U/L   ALT 38 14 - 54 U/L   Alkaline Phosphatase 41 38 - 126 U/L   Total Bilirubin 1.5 (H) 0.3 - 1.2 mg/dL   Bilirubin, Direct 0.3 0.1 - 0.5 mg/dL   Indirect Bilirubin 1.2 (H) 0.3 - 0.9 mg/dL  Glucose, capillary     Status: Abnormal   Collection Time: 07/13/14  5:44 PM  Result Value Ref Range   Glucose-Capillary 120 (H) 65 - 99 mg/dL   Comment 1 Document in Chart   Glucose, capillary     Status: Abnormal   Collection Time: 07/14/14 12:47 AM  Result Value Ref Range   Glucose-Capillary 104 (H) 65 - 99 mg/dL  Glucose, capillary     Status: None   Collection Time: 07/14/14  6:29 AM  Result Value Ref Range   Glucose-Capillary 89 65 - 99 mg/dL  Glucose, capillary     Status: None   Collection Time: 07/14/14  8:19 AM  Result Value Ref Range   Glucose-Capillary 96 65 - 99 mg/dL  Glucose, capillary     Status: None   Collection Time: 07/14/14 11:32 AM  Result Value Ref Range   Glucose-Capillary 86 65 - 99 mg/dL   Mr Lambert Mody Cm/mrcp  07/13/2014   CLINICAL DATA:  Right upper quadrant/epigastric pain, nausea/vomiting, dilated common bile duct on ultrasound  EXAM: MRI ABDOMEN WITHOUT CONTRAST  (INCLUDING MRCP)  TECHNIQUE: Multiplanar multisequence MR imaging of the abdomen was performed. Heavily T2-weighted  images of the biliary and pancreatic ducts were obtained, and three-dimensional MRCP images were rendered by post processing.  COMPARISON:  Right upper quadrant ultrasound dated 07/12/2014. CT abdomen pelvis dated 05/29/2014.  FINDINGS: Lower chest:  Lung bases are clear.  Cardiomegaly.  Hepatobiliary: Scattered hepatic cysts, measuring up to 16 mm in the medial segment left hepatic lobe (series 3/image 11). Mild hepatic steatosis. No suspicious/enhancing hepatic lesions.  Gallbladder is moderately distended with layering gallbladder sludge (series 13/ image 14). No associated inflammatory changes.  No intrahepatic ductal dilatation. Common duct measures 9 mm (series 5/ image 19), similar to prior CT. No choledocholithiasis is seen.  Pancreas: Within normal limits.  Spleen: Enlarged, measuring 19.4 cm in craniocaudal dimension, previously 17.2 cm.  Adrenals/Urinary Tract: Adrenal glands are within normal limits.  Kidneys within normal limits.  No hydronephrosis.  Stomach/Bowel: Stomach and visualized bowel are grossly unremarkable.  Vascular/Lymphatic: No evidence of abdominal aortic aneurysm.  No suspicious abdominal lymphadenopathy.  Other: No abdominal ascites.  Musculoskeletal: No focal osseous lesions.  IMPRESSION: Mild gallbladder distention with layering gallbladder sludge. No associated inflammatory changes on MR.  No intrahepatic ductal dilatation. Common duct measures 9 mm, similar to prior CT. No choledocholithiasis is seen.  Mild hepatic steatosis. Scattered hepatic cysts, measuring up to 1.6 cm. No suspicious/enhancing hepatic lesions.  Splenomegaly, measuring 19.4 cm, increased.   Electronically Signed   By: Julian Hy M.D.   On: 07/13/2014 17:26   US Abdomen Limited Ruq  07/12/2014   CLINICAL DATA:  Right upper quadrant abdominal pain. Elevated bilirubin level.  EXAM: US ABDOMEN LIMITED - RIGHT UPPER QUADRANT  COMPARISON:  Ultrasound of March 27, 2014.  FINDINGS: Gallbladder:  Gallbladder  appears to be mildly distended with mild amount of sludge present. No gallstones are noted. No gallbladder wall thickening or pericholecystic fluid is noted. Positive sonographic Percell Miller sign is noted.  Common bile duct:  Diameter: Common bile duct measures 9.1 mm concerning for  distal common bile duct obstruction, particularly the stated history of elevated bilirubin level.  Liver:  Increased echogenicity of hepatic parenchyma is noted suggesting fatty infiltration. Stable bilobed 1.9 cm left hepatic cyst is noted.  IMPRESSION: Stable left hepatic cyst.  Increased echogenicity of hepatic parenchyma consistent with fatty infiltration.  Common bile duct is dilated concerning for distal common bile duct obstruction given the history of elevated bilirubin level. MRCP is recommended for further evaluation.  Mild distention of gallbladder is noted with sludge present. Positive sonographic Murphy's sign is noted. HIDA scan is recommended to evaluate for possible cholecystitis.   Electronically Signed   By: Marijo Conception, M.D.   On: 07/12/2014 20:46    Physical Exam CONSTITUTIONAL:  Pleasant, well-developed, well-nourished, and in no acute distress. EYES: Pupils equal and reactive to light, Sclera non-icteric EARS, NOSE, MOUTH AND THROAT:  The oropharynx was clear.  Dentition is good repair.  Oral mucosa pink and moist. LYMPH NODES:  Lymph nodes in the neck and axillae were normal RESPIRATORY:  Lungs were clear.  Normal respiratory effort without pathologic use of accessory muscles of respiration CARDIOVASCULAR: Heart was regular without murmurs.  There were no carotid bruits. GI: The abdomen was soft, with tenderness noted in the right upper quadrant to deep palpation in the epigastric region as well. There are no scars. There are no masses and no hernias. GU:  Rectal deferred.   MUSCULOSKELETAL:  Normal muscle strength and tone.  No clubbing or cyanosis.   SKIN:  There were no pathologic skin lesions.   There were no nodules on palpation. NEUROLOGIC:  Sensation is normal.  Cranial nerves are grossly intact. PSYCH:  Oriented to person, place and time.  Mood and affect are normal.  Data Reviewed Laboratory data, MRCP, ultrasound and CT scan images were personally reviewed.  I have personally reviewed the patient's imaging, laboratory findings and medical records.    Assessment    This is a 39 old female with multiple medical problems obesity hepatic steatosis on CT scan and biliary colic and sludge. Workup thus far has been unrevealing however all of her symptoms point to the right upper quadrant and the gallbladder is etiology.    Plan    As the patient has been on clear liquids up until the time of her evaluation. She will undergo laparoscopic cholecystectomy with intraoperative cholangiography on Tuesday, July 12. I discussed with her and her husband the risks of surgery including that of bleeding conversion open operation and bile duct injury all of her questions were answered.     Time spent with patient was 65 minutes, with more than 50% of the time spent counseling and coordinating care of patient.   I spoke with Dr. Manuella Ghazi as well as with Dr. Rochel Brome of surgery regarding the patient's disposition.   Sherri Rad 07/14/2014, 1:48 PM

## 2014-07-14 NOTE — Progress Notes (Signed)
Initial Nutrition Assessment       INTERVENTION:   (Nutrition Supplement Therapy:) Recommend boost breeze TID for added nutrition  NUTRITION DIAGNOSIS:  Inadequate oral intake related to altered GI function as evidenced by meal completion < 50%.    GOAL:  Patient will meet greater than or equal to 90% of their needs    MONITOR:   (Energy intake, Gastrointestinal profile, Anthropometrics)  REASON FOR ASSESSMENT:  Malnutrition Screening Tool    ASSESSMENT:  Pt admitted with abdominal pain, weight loss, sludge in gallbladder.  GI following and surgery consulted.  PMHx:  Past Medical History  Diagnosis Date  . Brittle bone disease   . Asthma   . Thyroid disease   . Hypertension   . Back pain   . Diabetes mellitus without complication   . Chronic kidney disease   . Anxiety   . GERD (gastroesophageal reflux disease)   . Sleep apnea   . COPD (chronic obstructive pulmonary disease)   . Hypothyroidism   . Cervical disc disease   . TIA (transient ischemic attack)     Diet Order: clear liquids  Current Nutrition: does not like liquids but trying to sip  Food/Nutrition-Related History: reports eating 1 meal or less for the last 3 months secondary to abdominal pain, diarrhea   Medications: lasix, aspart  Electrolyte/Renal Profile and Glucose Profile:   Recent Labs Lab 07/12/14 1857 07/13/14 0443  NA 139 138  K 3.6 3.8  CL 103 108  CO2 23 25  BUN 12 12  CREATININE 0.69 0.70  CALCIUM 9.5 8.4*  GLUCOSE 109* 82   Protein Profile:   Recent Labs Lab 07/12/14 1857 07/13/14 1635  ALBUMIN 4.4 4.4    Gastrointestinal Profile: abdominal pain Last BM:7/10   Nutrition-Focused Physical Exam Findings: Nutrition-Focused physical exam completed. Findings are WDL for fat depletion, muscle depletion, and except for mild edema.    Weight Change: pt reports weight loss of 52 pounds in the last 3 months.  Noted per weight encounters weight gain since 2013.      Height:  Ht Readings from Last 1 Encounters:  07/12/14 5' 2"  (1.575 m)    Weight:  Wt Readings from Last 1 Encounters:  07/12/14 250 lb (113.399 kg)    Ideal Body Weight:     Wt Readings from Last 10 Encounters:  07/12/14 250 lb (113.399 kg)  06/23/14 248 lb (112.492 kg)  03/17/11 228 lb (103.42 kg)  01/07/11 243 lb (110.224 kg)    BMI:  Body mass index is 45.71 kg/(m^2).  Estimated Nutritional Needs:  Kcal:  Using IBW of 50kg (BEE 1068 kcals (IF 1.0-1.3, AF 1.3) 6712-4580 kcals/d.   Protein:  Using IBW of 50kg (1.0-1.2 g/kg) 50-60 g/d  Fluid:  Using IBW of 50kg (25-89m/kg) 1250-15056md  Skin:   no issues  Diet Order:  Diet clear liquid Room service appropriate?: Yes; Fluid consistency:: Thin  EDUCATION NEEDS:  No education needs identified at this time   Intake/Output Summary (Last 24 hours) at 07/14/14 1244 Last data filed at 07/14/14 0752  Gross per 24 hour  Intake    130 ml  Output   2051 ml  Net  -1921 ml     MODERATE Care Level Zaylyn Bergdoll B. AlZenia ResidesRDSt. StephensLDNicolletpager)

## 2014-07-14 NOTE — Progress Notes (Signed)
[place] [facility] [place] NAME: Lori Mckenzie    MR#:  [id number]  DATE OF BIRTH:  Dec 13, 1965  SUBJECTIVE:  Persistent abdominal pain, nausea REVIEW OF SYSTEMS:   Review of Systems  Constitutional: Negative for fever, chills and weight loss.  HENT: Negative for ear discharge, ear pain and nosebleeds.   Eyes: Negative for blurred vision, pain and discharge.  Respiratory: Negative for sputum production, shortness of breath, wheezing and stridor.   Cardiovascular: Negative for chest pain, palpitations, orthopnea and PND.  Gastrointestinal: Positive for nausea and abdominal pain. Negative for vomiting and diarrhea.  Genitourinary: Negative for urgency and frequency.  Musculoskeletal: Negative for back pain and joint pain.  Neurological: Negative for sensory change, speech change, focal weakness and weakness.  Psychiatric/Behavioral: Negative for depression and hallucinations. The patient is nervous/anxious.   All other systems reviewed and are negative.  Tolerating Diet: on liquids  DRUG ALLERGIES:   Allergies  Allergen Reactions  . Hydrocodone Itching    Other reaction(s): ITCHING  . Vicodin [Hydrocodone-Acetaminophen] Rash    VITALS:  Blood pressure 95/57, pulse 56, temperature 98.1 F (36.7 C), temperature source Oral, resp. rate 14, height 5' 2"  (1.575 m), weight 113.399 kg (250 lb), SpO2 99 %.  PHYSICAL EXAMINATION:   Physical Exam  Constitutional: She is oriented to person, place, and time and well-developed, well-nourished, and in no distress.  HENT:  Head: Normocephalic and atraumatic.  Eyes: Conjunctivae and EOM are normal. Pupils are equal, round, and reactive to light.  Neck: Normal range of motion. Neck supple. No tracheal deviation present. No thyromegaly present.  Cardiovascular: Normal rate, regular rhythm and normal heart sounds.   Pulmonary/Chest: Effort normal and breath sounds normal. No respiratory distress. She  has no wheezes. She exhibits no tenderness.  Abdominal: Soft. Bowel sounds are normal. She exhibits no distension. There is tenderness (right upper quadrant).  Musculoskeletal: Normal range of motion.  Neurological: She is alert and oriented to person, place, and time. No cranial nerve deficit.  Skin: Skin is warm and dry. No rash noted.  Psychiatric: Mood and affect normal.   LABORATORY PANEL:   CBC Recent Labs Lab 07/13/14 0443  WBC 6.6  HGB 12.5  HCT 37.3  PLT 106*    Chemistries  Recent Labs Lab 07/13/14 0443 07/13/14 1635  NA 138  --   K 3.8  --   CL 108  --   CO2 25  --   GLUCOSE 82  --   BUN 12  --   CREATININE 0.70  --   CALCIUM 8.4*  --   AST  --  58*  ALT  --  38  ALKPHOS  --  41  BILITOT  --  1.5*   RADIOLOGY:  Mr Lambert Mody Cm/mrcp  07/13/2014   CLINICAL DATA:  Right upper quadrant/epigastric pain, nausea/vomiting, dilated common bile duct on ultrasound  EXAM: MRI ABDOMEN WITHOUT CONTRAST  (INCLUDING MRCP)  TECHNIQUE: Multiplanar multisequence MR imaging of the abdomen was performed. Heavily T2-weighted images of the biliary and pancreatic ducts were obtained, and three-dimensional MRCP images were rendered by post processing.  COMPARISON:  Right upper quadrant ultrasound dated 07/12/2014. CT abdomen pelvis dated 05/29/2014.  FINDINGS: Lower chest:  Lung bases are clear.  Cardiomegaly.  Hepatobiliary: Scattered hepatic cysts, measuring up to 16 mm in the medial segment left hepatic lobe (series 3/image 11). Mild hepatic steatosis. No suspicious/enhancing hepatic lesions.  Gallbladder is moderately distended with layering gallbladder sludge (  series 13/ image 14). No associated inflammatory changes.  No intrahepatic ductal dilatation. Common duct measures 9 mm (series 5/ image 19), similar to prior CT. No choledocholithiasis is seen.  Pancreas: Within normal limits.  Spleen: Enlarged, measuring 19.4 cm in craniocaudal dimension, previously 17.2 cm.  Adrenals/Urinary Tract:  Adrenal glands are within normal limits.  Kidneys within normal limits.  No hydronephrosis.  Stomach/Bowel: Stomach and visualized bowel are grossly unremarkable.  Vascular/Lymphatic: No evidence of abdominal aortic aneurysm.  No suspicious abdominal lymphadenopathy.  Other: No abdominal ascites.  Musculoskeletal: No focal osseous lesions.  IMPRESSION: Mild gallbladder distention with layering gallbladder sludge. No associated inflammatory changes on MR.  No intrahepatic ductal dilatation. Common duct measures 9 mm, similar to prior CT. No choledocholithiasis is seen.  Mild hepatic steatosis. Scattered hepatic cysts, measuring up to 1.6 cm. No suspicious/enhancing hepatic lesions.  Splenomegaly, measuring 19.4 cm, increased.   Electronically Signed   By: Julian Hy M.D.   On: 07/13/2014 17:26   US Abdomen Limited Ruq  07/12/2014   CLINICAL DATA:  Right upper quadrant abdominal pain. Elevated bilirubin level.  EXAM: US ABDOMEN LIMITED - RIGHT UPPER QUADRANT  COMPARISON:  Ultrasound of March 27, 2014.  FINDINGS: Gallbladder:  Gallbladder appears to be mildly distended with mild amount of sludge present. No gallstones are noted. No gallbladder wall thickening or pericholecystic fluid is noted. Positive sonographic Percell Miller sign is noted.  Common bile duct:  Diameter: Common bile duct measures 9.1 mm concerning for distal common bile duct obstruction, particularly the stated history of elevated bilirubin level.  Liver:  Increased echogenicity of hepatic parenchyma is noted suggesting fatty infiltration. Stable bilobed 1.9 cm left hepatic cyst is noted.  IMPRESSION: Stable left hepatic cyst.  Increased echogenicity of hepatic parenchyma consistent with fatty infiltration.  Common bile duct is dilated concerning for distal common bile duct obstruction given the history of elevated bilirubin level. MRCP is recommended for further evaluation.  Mild distention of gallbladder is noted with sludge present. Positive  sonographic Murphy's sign is noted. HIDA scan is recommended to evaluate for possible cholecystitis.   Electronically Signed   By: Marijo Conception, M.D.   On: 07/12/2014 20:46   ASSESSMENT AND PLAN:  49 y.o. female who presents with right upper quadrant and epigastric abdominal pain with nausea and vomiting. Patient states that she's been dealing with these symptoms intermittently for the past couple months, and has had workup for the same.  * Right UQ pain with intractable nausea and vomitng and abnormal USG abdomen with possible Choledocholithiasis  -ultrasound imaging showing common bile duct dilatation and biliary sludge.  - pain control and antiemetics for nausea and vomiting control.  -negative MRCP - discussed with surgery Dr Marina Gravel - planning Lap Chole tomorrow  *  Type 2 diabetes mellitus - sliding scale insulin with fingerstick glucose checks every 6 hours for now while nothing by mouth, check hemoglobin A1c.  * COPD (chronic obstructive pulmonary disease) - continue home inhalers.  * HTN (hypertension) - controlled, continue home meds.  * OSA on CPAP - CPAP daily at bedtime   * GERD (gastroesophageal reflux disease) - equivalent home dose PPI  * Anxiety - home medications  * Thrombocytopenia: Hold off antiplt agents or lovenox, would do SCD-TEDs  Case discussed with Dr Felton Clinton Management plans discussed with the patient, family and they are in agreement.  CODE STATUS: full  DVT Prophylaxis: TEDs and SCD due to low plt  TOTAL TIME TAKING CARE OF THIS  PATIENT: 35 minutes.  >50% time spent on counselling and coordination of care  [facility], Keelin Sheridan M.D on 07/14/2014 at 3:48 PM  Between 7am to 6pm - Pager - [phone]  After 6pm go to [email or web address] - password [email or web address] [facility]  Office  [phone]  CC: Primary care physician; Ashkin, Neldon Labella, MD

## 2014-07-15 ENCOUNTER — Encounter
Admission: EM | Disposition: A | Discharge status: Other Acute Inpatient Hospital | Payer: Self-pay | Source: Home / Self Care | Attending: Internal Medicine

## 2014-07-15 ENCOUNTER — Inpatient Hospital Stay: Payer: Medicaid Other | Admitting: Registered Nurse

## 2014-07-15 ENCOUNTER — Encounter: Payer: Self-pay | Admitting: Anesthesiology

## 2014-07-15 ENCOUNTER — Inpatient Hospital Stay: Payer: Medicaid Other

## 2014-07-15 DIAGNOSIS — K7581 Nonalcoholic steatohepatitis (NASH): Secondary | ICD-10-CM | POA: Diagnosis present

## 2014-07-15 DIAGNOSIS — K8050 Calculus of bile duct without cholangitis or cholecystitis without obstruction: Secondary | ICD-10-CM

## 2014-07-15 DIAGNOSIS — K819 Cholecystitis, unspecified: Secondary | ICD-10-CM | POA: Diagnosis not present

## 2014-07-15 HISTORY — PX: CHOLECYSTECTOMY: SHX55

## 2014-07-15 LAB — GLUCOSE, CAPILLARY
Glucose-Capillary: 105 mg/dL — ABNORMAL HIGH (ref 65–99)
Glucose-Capillary: 188 mg/dL — ABNORMAL HIGH (ref 65–99)

## 2014-07-15 SURGERY — LAPAROSCOPIC CHOLECYSTECTOMY
Anesthesia: General | Wound class: Contaminated

## 2014-07-15 MED ORDER — PROPOFOL 10 MG/ML IV BOLUS
INTRAVENOUS | Status: DC | PRN
  Administered 2014-07-15: 180 mg via INTRAVENOUS

## 2014-07-15 MED ORDER — CEFAZOLIN SODIUM 1-5 GM-% IV SOLN
INTRAVENOUS | Status: DC | PRN
  Administered 2014-07-15: 1 g via INTRAVENOUS

## 2014-07-15 MED ORDER — FENTANYL CITRATE (PF) 100 MCG/2ML IJ SOLN
25.0000 ug | INTRAMUSCULAR | Status: AC | PRN
  Administered 2014-07-15 (×6): 25 ug via INTRAVENOUS

## 2014-07-15 MED ORDER — GABAPENTIN 300 MG PO CAPS
300.0000 mg | ORAL_CAPSULE | Freq: Three times a day (TID) | ORAL | Status: DC
  Administered 2014-07-15 – 2014-07-16 (×3): 300 mg via ORAL
  Filled 2014-07-15 (×3): qty 1

## 2014-07-15 MED ORDER — FENTANYL CITRATE (PF) 100 MCG/2ML IJ SOLN
INTRAMUSCULAR | Status: DC | PRN
Start: 2014-07-15 — End: 2014-07-15
  Administered 2014-07-15 (×2): 50 ug via INTRAVENOUS
  Administered 2014-07-15: 100 ug via INTRAVENOUS

## 2014-07-15 MED ORDER — LIDOCAINE HCL (CARDIAC) 20 MG/ML IV SOLN
INTRAVENOUS | Status: DC | PRN
  Administered 2014-07-15: 50 mg via INTRAVENOUS

## 2014-07-15 MED ORDER — THROMBIN 5000 UNITS EX SOLR
CUTANEOUS | Status: DC | PRN
  Administered 2014-07-15: 5000 [IU] via TOPICAL

## 2014-07-15 MED ORDER — TRAMADOL HCL 50 MG PO TABS
50.0000 mg | ORAL_TABLET | Freq: Four times a day (QID) | ORAL | Status: DC | PRN
  Administered 2014-07-15: 50 mg via ORAL
  Filled 2014-07-15: qty 1

## 2014-07-15 MED ORDER — ONDANSETRON HCL 4 MG/2ML IJ SOLN
4.0000 mg | Freq: Once | INTRAMUSCULAR | Status: AC | PRN
  Administered 2014-07-15: 4 mg via INTRAVENOUS

## 2014-07-15 MED ORDER — HYDROCODONE-ACETAMINOPHEN 5-325 MG PO TABS
1.0000 | ORAL_TABLET | Freq: Four times a day (QID) | ORAL | Status: DC | PRN
  Filled 2014-07-15: qty 1

## 2014-07-15 MED ORDER — DEXAMETHASONE SODIUM PHOSPHATE 4 MG/ML IJ SOLN
INTRAMUSCULAR | Status: DC | PRN
  Administered 2014-07-15: 5 mg via INTRAVENOUS

## 2014-07-15 MED ORDER — LACTATED RINGERS IV SOLN
INTRAVENOUS | Status: DC | PRN
  Administered 2014-07-15: 13:00:00 via INTRAVENOUS

## 2014-07-15 MED ORDER — EPHEDRINE SULFATE 50 MG/ML IJ SOLN
INTRAMUSCULAR | Status: DC | PRN
  Administered 2014-07-15: 10 mg via INTRAVENOUS

## 2014-07-15 MED ORDER — BUPIVACAINE HCL 0.25 % IJ SOLN
INTRAMUSCULAR | Status: DC | PRN
  Administered 2014-07-15: 25 mL

## 2014-07-15 MED ORDER — ROCURONIUM BROMIDE 100 MG/10ML IV SOLN
INTRAVENOUS | Status: DC | PRN
  Administered 2014-07-15: 10 mg via INTRAVENOUS
  Administered 2014-07-15: 40 mg via INTRAVENOUS

## 2014-07-15 MED ORDER — SUGAMMADEX SODIUM 200 MG/2ML IV SOLN
INTRAVENOUS | Status: DC | PRN
  Administered 2014-07-15: 200 mg via INTRAVENOUS

## 2014-07-15 SURGICAL SUPPLY — 54 items
APPLICATOR SURGIFLO (MISCELLANEOUS) ×2 IMPLANT
APPLIER CLIP 5 13 M/L LIGAMAX5 (MISCELLANEOUS) ×2
BAG COUNTER SPONGE EZ (MISCELLANEOUS) IMPLANT
BENZOIN TINCTURE PRP APPL 2/3 (GAUZE/BANDAGES/DRESSINGS) ×2 IMPLANT
BULB RESERV EVAC DRAIN JP 100C (MISCELLANEOUS) ×4 IMPLANT
CANISTER SUCT 1200ML W/VALVE (MISCELLANEOUS) ×2 IMPLANT
CATH CHOLANG 76X19 KUMAR (CATHETERS) ×2 IMPLANT
CHLORAPREP W/TINT 26ML (MISCELLANEOUS) ×2 IMPLANT
CLIP APPLIE 5 13 M/L LIGAMAX5 (MISCELLANEOUS) ×1 IMPLANT
DEFOGGER SCOPE WARMER CLEARIFY (MISCELLANEOUS) ×2 IMPLANT
DISSECTOR KITTNER STICK (MISCELLANEOUS) ×2 IMPLANT
DISSECTORS/KITTNER STICK (MISCELLANEOUS) ×4
DRAIN CHANNEL JP 19F (MISCELLANEOUS) ×2 IMPLANT
DRAPE SHEET LG 3/4 BI-LAMINATE (DRAPES) ×2 IMPLANT
DRAPE UTILITY 15X26 TOWEL STRL (DRAPES) ×4 IMPLANT
DRESSING TELFA 4X3 1S ST N-ADH (GAUZE/BANDAGES/DRESSINGS) ×2 IMPLANT
DRSG TEGADERM 2-3/8X2-3/4 SM (GAUZE/BANDAGES/DRESSINGS) ×2 IMPLANT
DRSG TELFA 3X8 NADH (GAUZE/BANDAGES/DRESSINGS) ×2 IMPLANT
ENDOPOUCH RETRIEVER 10 (MISCELLANEOUS) ×2 IMPLANT
GLOVE BIO SURGEON STRL SZ7.5 (GLOVE) ×12 IMPLANT
GOWN STRL REUS W/ TWL LRG LVL3 (GOWN DISPOSABLE) IMPLANT
GOWN STRL REUS W/ TWL XL LVL3 (GOWN DISPOSABLE) ×4 IMPLANT
GOWN STRL REUS W/TWL LRG LVL3 (GOWN DISPOSABLE)
GOWN STRL REUS W/TWL XL LVL3 (GOWN DISPOSABLE) ×4
HEMOSTAT SURGICEL 2X14 (HEMOSTASIS) ×2 IMPLANT
IRRIGATION STRYKERFLOW (MISCELLANEOUS) ×1 IMPLANT
IRRIGATOR STRYKERFLOW (MISCELLANEOUS) ×2
IV NS 1000ML (IV SOLUTION) ×2
IV NS 1000ML BAXH (IV SOLUTION) ×2 IMPLANT
LABEL OR SOLS (LABEL) IMPLANT
NEEDLE 18GX1X1/2 (RX/OR ONLY) (NEEDLE) ×2 IMPLANT
NEEDLE HYPO 25X1 1.5 SAFETY (NEEDLE) ×2 IMPLANT
NS IRRIG 500ML POUR BTL (IV SOLUTION) ×2 IMPLANT
PACK LAP CHOLECYSTECTOMY (MISCELLANEOUS) ×2 IMPLANT
PAD GROUND ADULT SPLIT (MISCELLANEOUS) ×2 IMPLANT
SCISSORS METZENBAUM CVD 33 (INSTRUMENTS) ×2 IMPLANT
SLEEVE ADV FIXATION 5X100MM (TROCAR) ×4 IMPLANT
SPOGE SURGIFLO 8M (HEMOSTASIS) ×1
SPONGE SURGIFLO 8M (HEMOSTASIS) ×1 IMPLANT
STRAP SAFETY BODY (MISCELLANEOUS) ×2 IMPLANT
STRIP CLOSURE SKIN 1/2X4 (GAUZE/BANDAGES/DRESSINGS) ×2 IMPLANT
SUT ETHILON 3-0 FS-10 30 BLK (SUTURE) ×4
SUT ETHILON 4-0 (SUTURE) ×2
SUT ETHILON 4-0 FS2 18XMFL BLK (SUTURE) ×2
SUT VIC AB 0 UR5 27 (SUTURE) ×4 IMPLANT
SUT VIC AB 4-0 FS2 27 (SUTURE) ×2 IMPLANT
SUTURE EHLN 3-0 FS-10 30 BLK (SUTURE) ×2 IMPLANT
SUTURE ETHLN 4-0 FS2 18XMF BLK (SUTURE) ×2 IMPLANT
SWABSTK COMLB BENZOIN TINCTURE (MISCELLANEOUS) ×2 IMPLANT
SYR 3ML LL SCALE MARK (SYRINGE) ×2 IMPLANT
TROCAR XCEL BLUNT TIP 100MML (ENDOMECHANICALS) ×2 IMPLANT
TROCAR Z-THREAD FIOS 11X100 BL (TROCAR) ×2 IMPLANT
TROCAR Z-THREAD SLEEVE 11X100 (TROCAR) ×2 IMPLANT
TUBING INSUFFLATOR HI FLOW (MISCELLANEOUS) ×2 IMPLANT

## 2014-07-15 NOTE — Progress Notes (Signed)
Dr. Marina Gravel notified of JP drain dressing leaking and being changed/reinforced. 07/15/14

## 2014-07-15 NOTE — Anesthesia Postprocedure Evaluation (Signed)
  Anesthesia Post-op Note  Patient: Lori Mckenzie  Procedure(s) Performed: Procedure(s): LAPAROSCOPIC CHOLECYSTECTOMY with liver biopsy  (N/A)  Anesthesia type:General  Patient location: PACU  Post pain: Pain level controlled  Post assessment: Post-op Vital signs reviewed, Patient's Cardiovascular Status Stable, Respiratory Function Stable, Patent Airway and No signs of Nausea or vomiting  Post vital signs: Reviewed and stable  Last Vitals:  Filed Vitals:   07/15/14 1504  BP: 132/81  Pulse: 83  Temp: 36.8 C  Resp: 20    Level of consciousness: awake, alert  and patient cooperative  Complications: No apparent anesthesia complications

## 2014-07-15 NOTE — Anesthesia Preprocedure Evaluation (Addendum)
Anesthesia Evaluation  Patient identified by MRN, date of birth, ID band Patient awake    Reviewed: Allergy & Precautions, NPO status , Patient's Chart, lab work & pertinent test results, reviewed documented beta blocker date and time   Airway Mallampati: III  TM Distance: >3 FB     Dental  (+) Chipped   Pulmonary asthma , sleep apnea , COPD         Cardiovascular hypertension,     Neuro/Psych Anxiety TIA   GI/Hepatic GERD-  ,  Endo/Other  diabetesHypothyroidism   Renal/GU Renal disease     Musculoskeletal   Abdominal   Peds  Hematology   Anesthesia Other Findings I have told her to use CPAP post op.  Reproductive/Obstetrics                         Anesthesia Physical Anesthesia Plan  ASA: III  Anesthesia Plan: General   Post-op Pain Management:    Induction: Intravenous  Airway Management Planned: Oral ETT  Additional Equipment:   Intra-op Plan:   Post-operative Plan:   Informed Consent: I have reviewed the patients History and Physical, chart, labs and discussed the procedure including the risks, benefits and alternatives for the proposed anesthesia with the patient or authorized representative who has indicated his/her understanding and acceptance.     Plan Discussed with: CRNA  Anesthesia Plan Comments:        Anesthesia Quick Evaluation

## 2014-07-15 NOTE — Progress Notes (Signed)
[place] at [place] NAME: Lori Mckenzie    MR#:  [id number]  DATE OF BIRTH:  [birth year]  SUBJECTIVE:  Persistent abdominal pain, nausea - waiting for surgery today REVIEW OF SYSTEMS:   Review of Systems  Constitutional: Negative for fever, chills and weight loss.  HENT: Negative for ear discharge, ear pain and nosebleeds.   Eyes: Negative for blurred vision, pain and discharge.  Respiratory: Negative for sputum production, shortness of breath, wheezing and stridor.   Cardiovascular: Negative for chest pain, palpitations, orthopnea and PND.  Gastrointestinal: Positive for nausea and abdominal pain. Negative for vomiting and diarrhea.  Genitourinary: Negative for urgency and frequency.  Musculoskeletal: Negative for back pain and joint pain.  Neurological: Negative for sensory change, speech change, focal weakness and weakness.  Psychiatric/Behavioral: Negative for depression and hallucinations. The patient is nervous/anxious.   All other systems reviewed and are negative.  Tolerating Diet: on liquids  DRUG ALLERGIES:   Allergies  Allergen Reactions  . Hydrocodone Itching    Other reaction(s): ITCHING  . Vicodin [Hydrocodone-Acetaminophen] Rash    VITALS:  Blood pressure 95/50, pulse 63, temperature 97.8 F (36.6 C), temperature source Oral, resp. rate 17, height 5' 2"  (1.575 m), weight 113.399 kg (250 lb), SpO2 96 %.  PHYSICAL EXAMINATION:   Physical Exam  Constitutional: She is oriented to person, place, and time and well-developed, well-nourished, and in no distress.  HENT:  Head: Normocephalic and atraumatic.  Eyes: Conjunctivae and EOM are normal. Pupils are equal, round, and reactive to light.  Neck: Normal range of motion. Neck supple. No tracheal deviation present. No thyromegaly present.  Cardiovascular: Normal rate, regular rhythm and normal heart sounds.   Pulmonary/Chest: Effort normal and breath sounds normal. No  respiratory distress. She has no wheezes. She exhibits no tenderness.  Abdominal: Soft. Bowel sounds are normal. She exhibits no distension. There is tenderness (right upper quadrant).  Musculoskeletal: Normal range of motion.  Neurological: She is alert and oriented to person, place, and time. No cranial nerve deficit.  Skin: Skin is warm and dry. No rash noted.  Psychiatric: Mood and affect normal.   LABORATORY PANEL:   CBC  Recent Labs Lab 07/13/14 0443  WBC 6.6  HGB 12.5  HCT 37.3  PLT 106*    Chemistries   Recent Labs Lab 07/13/14 0443 07/13/14 1635  NA 138  --   K 3.8  --   CL 108  --   CO2 25  --   GLUCOSE 82  --   BUN 12  --   CREATININE 0.70  --   CALCIUM 8.4*  --   AST  --  58*  ALT  --  38  ALKPHOS  --  41  BILITOT  --  1.5*   RADIOLOGY:  Mr Lambert Mody Cm/mrcp  07/13/2014   CLINICAL DATA:  Right upper quadrant/epigastric pain, nausea/vomiting, dilated common bile duct on ultrasound  EXAM: MRI ABDOMEN WITHOUT CONTRAST  (INCLUDING MRCP)  TECHNIQUE: Multiplanar multisequence MR imaging of the abdomen was performed. Heavily T2-weighted images of the biliary and pancreatic ducts were obtained, and three-dimensional MRCP images were rendered by post processing.  COMPARISON:  Right upper quadrant ultrasound dated 07/12/2014. CT abdomen pelvis dated 05/29/2014.  FINDINGS: Lower chest:  Lung bases are clear.  Cardiomegaly.  Hepatobiliary: Scattered hepatic cysts, measuring up to 16 mm in the medial segment left hepatic lobe (series 3/image 11). Mild hepatic steatosis. No suspicious/enhancing hepatic lesions.  Gallbladder  is moderately distended with layering gallbladder sludge (series 13/ image 14). No associated inflammatory changes.  No intrahepatic ductal dilatation. Common duct measures 9 mm (series 5/ image 19), similar to prior CT. No choledocholithiasis is seen.  Pancreas: Within normal limits.  Spleen: Enlarged, measuring 19.4 cm in craniocaudal dimension, previously  17.2 cm.  Adrenals/Urinary Tract: Adrenal glands are within normal limits.  Kidneys within normal limits.  No hydronephrosis.  Stomach/Bowel: Stomach and visualized bowel are grossly unremarkable.  Vascular/Lymphatic: No evidence of abdominal aortic aneurysm.  No suspicious abdominal lymphadenopathy.  Other: No abdominal ascites.  Musculoskeletal: No focal osseous lesions.  IMPRESSION: Mild gallbladder distention with layering gallbladder sludge. No associated inflammatory changes on MR.  No intrahepatic ductal dilatation. Common duct measures 9 mm, similar to prior CT. No choledocholithiasis is seen.  Mild hepatic steatosis. Scattered hepatic cysts, measuring up to 1.6 cm. No suspicious/enhancing hepatic lesions.  Splenomegaly, measuring 19.4 cm, increased.   Electronically Signed   By: Julian Hy M.D.   On: 07/13/2014 17:26   ASSESSMENT AND PLAN:  49 y.o. female who presents with right upper quadrant and epigastric abdominal pain with nausea and vomiting. Patient states that she's been dealing with these symptoms intermittently for the past couple months, and has had workup for the same.  * Right UQ pain with intractable nausea and vomitng and abnormal USG abdomen with possible Choledocholithiasis  -ultrasound imaging showing common bile duct dilatation and biliary sludge.  - pain control and antiemetics for nausea and vomiting control.  -negative MRCP - discussed with surgery Dr Marina Gravel - planning Lap Chole today  *  Type 2 diabetes mellitus - sliding scale insulin with fingerstick glucose checks every 6 hours for now while nothing by mouth, check hemoglobin A1c.  * COPD (chronic obstructive pulmonary disease) - continue home inhalers.  * HTN (hypertension) - controlled, continue home meds.  * OSA on CPAP - CPAP daily at bedtime   * GERD (gastroesophageal reflux disease) - equivalent home dose PPI  * Anxiety - home medications  * Thrombocytopenia: Hold off antiplt agents or lovenox,  would do SCD-TEDs  Case discussed with Dr Felton Clinton Management plans discussed with the patient, family and they are in agreement.  CODE STATUS: full  DVT Prophylaxis: TEDs and SCD due to low plt  TOTAL TIME TAKING CARE OF THIS PATIENT: 35 minutes.  >50% time spent on counselling and coordination of care  [facility], Parveen Freehling M.D on 07/15/2014 at 12:36 PM  Between 7am to 6pm - Pager - [phone]  After 6pm go to [email or web address] - password [email or web address] [facility]  Office  [phone]  CC: Primary care physician; Ashkin, Neldon Labella, MD

## 2014-07-15 NOTE — Anesthesia Procedure Notes (Signed)
Procedure Name: Intubation Date/Time: 07/15/2014 1:07 PM Performed by: Eliberto Ivory Pre-anesthesia Checklist: Patient identified, Patient being monitored, Timeout performed, Emergency Drugs available and Suction available Patient Re-evaluated:Patient Re-evaluated prior to inductionOxygen Delivery Method: Circle system utilized Preoxygenation: Pre-oxygenation with 100% oxygen Intubation Type: IV induction Ventilation: Mask ventilation without difficulty Laryngoscope Size: Mac and 3 Grade View: Grade II Tube type: Oral Tube size: 7.5 mm Number of attempts: 1 Airway Equipment and Method: Video-laryngoscopy Placement Confirmation: ETT inserted through vocal cords under direct vision,  positive ETCO2 and breath sounds checked- equal and bilateral Secured at: 21 cm Tube secured with: Tape Dental Injury: Teeth and Oropharynx as per pre-operative assessment

## 2014-07-15 NOTE — Progress Notes (Signed)
Patient post surgery. Breathing shallow but able maintain o2 saturation at 94% on 2liters. Patient continues to decline cpap due to nature of condition. Will request if she changes her mind

## 2014-07-15 NOTE — Op Note (Signed)
07/12/2014 - 07/15/2014  2:50 PM  PATIENT:  Lori Mckenzie  49 y.o. female  PRE-OPERATIVE DIAGNOSIS:  Cholelithiasis  POST-OPERATIVE DIAGNOSIS:  cholelithiasis  PROCEDURE:  Procedure(s): LAPAROSCOPIC CHOLECYSTECTOMY with liver biopsy  (N/A)  SURGEON:  Surgeon(s) and Role:    Sherri Rad, MD - Primary  ASSISTANTS: none  ANESTHESIA:general     SPECIMEN: gallbladder and liver biopsy    EBL: 150 cc  Findings:  Grossly nodular liver, intrahepatic gallbladder, very friable cystic duct which tore, one clip able to be placed.  Description of procedure:    Informed consent supine position and general oral endotracheal anesthesia patient's abdomen was sterilely prepped and draped core prep solution and a time out was observed.   An infraumbilical transversely oriented skin incision was fashion with scalpel and carried down with sharp dissection to the abdominal midline fascia which was incised in the midline and elevated with curved clamp. Peritoneum was entered sharply. Vicryls stitch was placed as stay sutures to the fascia. Pneumoperitoneum was established via a 12 mm blunt Hassan trocar. The patient was then positioned in reverse Trendelenburg and airplane right side up. A 11 mm bladed trocar was placed in the epigastric region and 25 mm first assistant ports in the right subcostal margin. Upper scopic evaluation of the abdomen demonstrated some adhesions within the pelvis.   The gallbladder was encased in omental attachments which were taken down with sharp dissection and point cautery. The liver appeared micronodular in appearance and was very stiff.  The gallbladder was nearly intrahepatic. The gallbladder was grasped along its fundus and elevated as best possible to the right shoulder. Lateral traction was achieved the Hartman's pouch. Dissection of the hepatoduodenal ligament demonstrated a thickened peritoneal fold most anteriorly which was divided between hemoclips. The cystic duct and  cystic artery were identified. The cystic artery was anterior to the duct. It was divided with 3 clips on the portal side single clip on the gallbladder side and scissors. The cystic duct was diminutive and very short. During manipulation of the cystic duct it became torn and only one clip could be placed on the portal side. There was some debris within the duct. The dissection was not felt to be safe due to the friability of the portal tissues. The gallbladder was then retrieved off the gallbladder fossa utilizing gentle traction hook cautery. Gallbladder was friable and its nature and multiple tears were encountered in spillage of bile was encountered. Hemostasis was obtained in the gallbladder fossa with point cautery the application of direct pressure with 4 x 4 gauze followed by Surgicel Surgi-Flo and thrombin.   Photodocumentation of the liver appearance was provided for the chart.   Wedge liver biopsy was obtained from the edge of the liver submitted to pathology in formalin. Hemostasis been achieved on the liver edge utilizing point cautery the application of Surgicel.   With hemostasis appeared to be adequate on the operative field and a 10m Blake drain was directed into the gallbladder fossa and Morison's pouch. Sedated the lower most right upper quadrant port site and drain site was secured with 3-0 nylon suture at the skin.   Ports are then removed under direct visualization. The infraumbilical fascial defect was reapproximated with an additional figure-of-eight number 000 Vicryl suture applied in vertical orientation. A total of 20 cc of 0.25% plain Marcaine was infiltrated along all skin and fascial incisions prior to closure. Skin edges were reapproximated utilizing 4-0 nylon in simple fashion. Sterile occlusive dressings were placed and  the patient was subsequently extubated and taken to the recovery room in stable and satisfactory condition by anesthesia services.

## 2014-07-15 NOTE — Transfer of Care (Signed)
Immediate Anesthesia Transfer of Care Note  Patient: Lori Mckenzie  Procedure(s) Performed: Procedure(s): LAPAROSCOPIC CHOLECYSTECTOMY with liver biopsy  (N/A)  Patient Location: PACU  Anesthesia Type:General  Level of Consciousness: Alert, Awake, Oriented  Airway & Oxygen Therapy: Patient Spontanous Breathing  Post-op Assessment: Report given to RN  Post vital signs: Reviewed and stable  Last Vitals:  Filed Vitals:   07/15/14 1504  BP: 132/81  Pulse: 83  Temp: 36.8 C  Resp: 20    Complications: No apparent anesthesia complications

## 2014-07-15 NOTE — Progress Notes (Signed)
Date of Initial H&P: 07/12/2014.  History reviewed, patient examined, no change in status, stable for surgery.

## 2014-07-16 ENCOUNTER — Encounter (HOSPITAL_COMMUNITY): Payer: Self-pay

## 2014-07-16 ENCOUNTER — Inpatient Hospital Stay: Payer: Medicaid Other

## 2014-07-16 ENCOUNTER — Encounter: Payer: Self-pay | Admitting: Surgery

## 2014-07-16 ENCOUNTER — Inpatient Hospital Stay
Admit: 2014-07-16 | Discharge: 2014-07-30 | Disposition: A | Discharge status: Home / Self Care | Payer: Medicaid Other | Source: Other Acute Inpatient Hospital | Attending: Surgery | Admitting: Surgery

## 2014-07-16 ENCOUNTER — Ambulatory Visit (HOSPITAL_COMMUNITY)
Admission: RE | Admit: 2014-07-16 | Discharge: 2014-07-16 | Disposition: A | Discharge status: Other Acute Inpatient Hospital | Payer: Medicaid Other | Source: Ambulatory Visit | Attending: Gastroenterology | Admitting: Gastroenterology

## 2014-07-16 ENCOUNTER — Ambulatory Visit (HOSPITAL_COMMUNITY): Payer: Medicaid Other

## 2014-07-16 ENCOUNTER — Encounter: Payer: Self-pay | Admitting: Internal Medicine

## 2014-07-16 ENCOUNTER — Encounter (HOSPITAL_COMMUNITY)
Admission: RE | Disposition: A | Discharge status: Other Acute Inpatient Hospital | Payer: Self-pay | Source: Ambulatory Visit | Attending: Gastroenterology

## 2014-07-16 ENCOUNTER — Ambulatory Visit (HOSPITAL_COMMUNITY): Payer: Medicaid Other | Admitting: Anesthesiology

## 2014-07-16 ENCOUNTER — Other Ambulatory Visit: Payer: Medicaid Other

## 2014-07-16 DIAGNOSIS — N189 Chronic kidney disease, unspecified: Secondary | ICD-10-CM | POA: Diagnosis not present

## 2014-07-16 DIAGNOSIS — I129 Hypertensive chronic kidney disease with stage 1 through stage 4 chronic kidney disease, or unspecified chronic kidney disease: Secondary | ICD-10-CM | POA: Diagnosis not present

## 2014-07-16 DIAGNOSIS — K219 Gastro-esophageal reflux disease without esophagitis: Secondary | ICD-10-CM | POA: Insufficient documentation

## 2014-07-16 DIAGNOSIS — K838 Other specified diseases of biliary tract: Secondary | ICD-10-CM | POA: Diagnosis present

## 2014-07-16 DIAGNOSIS — J449 Chronic obstructive pulmonary disease, unspecified: Secondary | ICD-10-CM | POA: Insufficient documentation

## 2014-07-16 DIAGNOSIS — M7989 Other specified soft tissue disorders: Secondary | ICD-10-CM

## 2014-07-16 DIAGNOSIS — R109 Unspecified abdominal pain: Secondary | ICD-10-CM

## 2014-07-16 DIAGNOSIS — K9189 Other postprocedural complications and disorders of digestive system: Secondary | ICD-10-CM | POA: Insufficient documentation

## 2014-07-16 DIAGNOSIS — Z8673 Personal history of transient ischemic attack (TIA), and cerebral infarction without residual deficits: Secondary | ICD-10-CM | POA: Insufficient documentation

## 2014-07-16 DIAGNOSIS — E1122 Type 2 diabetes mellitus with diabetic chronic kidney disease: Secondary | ICD-10-CM | POA: Insufficient documentation

## 2014-07-16 DIAGNOSIS — R0989 Other specified symptoms and signs involving the circulatory and respiratory systems: Secondary | ICD-10-CM

## 2014-07-16 DIAGNOSIS — F419 Anxiety disorder, unspecified: Secondary | ICD-10-CM | POA: Insufficient documentation

## 2014-07-16 DIAGNOSIS — G4730 Sleep apnea, unspecified: Secondary | ICD-10-CM | POA: Insufficient documentation

## 2014-07-16 DIAGNOSIS — J45909 Unspecified asthma, uncomplicated: Secondary | ICD-10-CM | POA: Insufficient documentation

## 2014-07-16 DIAGNOSIS — Z9981 Dependence on supplemental oxygen: Secondary | ICD-10-CM | POA: Insufficient documentation

## 2014-07-16 DIAGNOSIS — K839 Disease of biliary tract, unspecified: Secondary | ICD-10-CM

## 2014-07-16 DIAGNOSIS — E039 Hypothyroidism, unspecified: Secondary | ICD-10-CM | POA: Insufficient documentation

## 2014-07-16 DIAGNOSIS — K759 Inflammatory liver disease, unspecified: Secondary | ICD-10-CM | POA: Insufficient documentation

## 2014-07-16 DIAGNOSIS — Z79899 Other long term (current) drug therapy: Secondary | ICD-10-CM | POA: Insufficient documentation

## 2014-07-16 DIAGNOSIS — K833 Fistula of bile duct: Secondary | ICD-10-CM

## 2014-07-16 DIAGNOSIS — K819 Cholecystitis, unspecified: Secondary | ICD-10-CM | POA: Diagnosis present

## 2014-07-16 HISTORY — PX: ERCP: SHX5425

## 2014-07-16 LAB — COMPREHENSIVE METABOLIC PANEL
ALT: 33 U/L (ref 14–54)
AST: 45 U/L — ABNORMAL HIGH (ref 15–41)
Albumin: 4.1 g/dL (ref 3.5–5.0)
Alkaline Phosphatase: 43 U/L (ref 38–126)
Anion gap: 4 — ABNORMAL LOW (ref 5–15)
BUN: 9 mg/dL (ref 6–20)
CO2: 27 mmol/L (ref 22–32)
Calcium: 8.8 mg/dL — ABNORMAL LOW (ref 8.9–10.3)
Chloride: 109 mmol/L (ref 101–111)
Creatinine, Ser: 0.79 mg/dL (ref 0.44–1.00)
GFR calc Af Amer: >60 mL/min (ref >60)
GFR calc non Af Amer: >60 mL/min (ref >60)
Glucose, Bld: 174 mg/dL — ABNORMAL HIGH (ref 65–99)
Potassium: 3.8 mmol/L (ref 3.5–5.1)
Sodium: 140 mmol/L (ref 135–145)
Total Bilirubin: 1.3 mg/dL — ABNORMAL HIGH (ref 0.3–1.2)
Total Protein: 7.5 g/dL (ref 6.5–8.1)

## 2014-07-16 LAB — GLUCOSE, CAPILLARY
Glucose-Capillary: 136 mg/dL — ABNORMAL HIGH (ref 65–99)
Glucose-Capillary: 142 mg/dL — ABNORMAL HIGH (ref 65–99)
Glucose-Capillary: 128 mg/dL — ABNORMAL HIGH (ref 65–99)

## 2014-07-16 LAB — CBC
HCT: 38.1 % (ref 35.0–47.0)
Hemoglobin: 12.7 g/dL (ref 12.0–16.0)
MCH: 32 pg (ref 26.0–34.0)
MCHC: 33.3 g/dL (ref 32.0–36.0)
MCV: 96.2 fL (ref 80.0–100.0)
Platelets: 130 10*3/uL — ABNORMAL LOW (ref 150–440)
RBC: 3.96 MIL/uL (ref 3.80–5.20)
RDW: 13.3 % (ref 11.5–14.5)
WBC: 11.4 10*3/uL — ABNORMAL HIGH (ref 3.6–11.0)

## 2014-07-16 SURGERY — ERCP, WITH INTERVENTION IF INDICATED
Anesthesia: General

## 2014-07-16 MED ORDER — MIDAZOLAM HCL 2 MG/2ML IJ SOLN
INTRAMUSCULAR | Status: AC
Start: 2014-07-16 — End: 2014-07-16
  Filled 2014-07-16: qty 2

## 2014-07-16 MED ORDER — HYDROMORPHONE HCL 1 MG/ML IJ SOLN
INTRAMUSCULAR | Status: AC
  Filled 2014-07-16: qty 2

## 2014-07-16 MED ORDER — PIPERACILLIN-TAZOBACTAM 3.375 G IVPB 30 MIN
INTRAVENOUS | Status: DC | PRN
  Administered 2014-07-16: 3.375 g via INTRAVENOUS

## 2014-07-16 MED ORDER — GLUCAGON HCL RDNA (DIAGNOSTIC) 1 MG IJ SOLR
INTRAMUSCULAR | Status: AC
  Filled 2014-07-16: qty 1

## 2014-07-16 MED ORDER — FLUTICASONE PROPIONATE 50 MCG/ACT NA SUSP
1.0000 | Freq: Every day | NASAL | Status: DC | PRN
  Filled 2014-07-16: qty 16

## 2014-07-16 MED ORDER — FENTANYL CITRATE (PF) 100 MCG/2ML IJ SOLN
INTRAMUSCULAR | Status: DC | PRN
  Administered 2014-07-16 (×2): 50 ug via INTRAVENOUS

## 2014-07-16 MED ORDER — LIDOCAINE HCL (CARDIAC) 20 MG/ML IV SOLN
INTRAVENOUS | Status: AC
  Filled 2014-07-16: qty 5

## 2014-07-16 MED ORDER — FENTANYL CITRATE (PF) 100 MCG/2ML IJ SOLN
INTRAMUSCULAR | Status: AC
  Filled 2014-07-16: qty 2

## 2014-07-16 MED ORDER — SODIUM CHLORIDE 0.9 % IV SOLN
INTRAVENOUS | Status: DC | PRN
  Administered 2014-07-16: 19:00:00

## 2014-07-16 MED ORDER — GABAPENTIN 300 MG PO CAPS
300.0000 mg | ORAL_CAPSULE | Freq: Four times a day (QID) | ORAL | Status: DC | PRN
  Administered 2014-07-17 – 2014-07-29 (×13): 300 mg via ORAL
  Filled 2014-07-16 (×14): qty 1

## 2014-07-16 MED ORDER — LIDOCAINE HCL (CARDIAC) 20 MG/ML IV SOLN
INTRAVENOUS | Status: DC | PRN
  Administered 2014-07-16: 50 mg via INTRAVENOUS

## 2014-07-16 MED ORDER — IPRATROPIUM-ALBUTEROL 0.5-2.5 (3) MG/3ML IN SOLN
6.0000 mL | Freq: Four times a day (QID) | RESPIRATORY_TRACT | Status: DC | PRN
  Administered 2014-07-17 – 2014-07-29 (×2): 3 mL via RESPIRATORY_TRACT
  Filled 2014-07-16 (×2): qty 6

## 2014-07-16 MED ORDER — SUCCINYLCHOLINE CHLORIDE 20 MG/ML IJ SOLN
INTRAMUSCULAR | Status: DC | PRN
  Administered 2014-07-16: 100 mg via INTRAVENOUS

## 2014-07-16 MED ORDER — ALBUTEROL SULFATE HFA 108 (90 BASE) MCG/ACT IN AERS: 2.0000 | INHALATION_SPRAY | RESPIRATORY_TRACT | Status: DC | PRN

## 2014-07-16 MED ORDER — SPIRONOLACTONE 25 MG PO TABS
50.0000 mg | ORAL_TABLET | Freq: Every day | ORAL | Status: DC
  Administered 2014-07-23 – 2014-07-26 (×2): 50 mg via ORAL
  Filled 2014-07-16 (×10): qty 2

## 2014-07-16 MED ORDER — HYDROMORPHONE HCL 1 MG/ML IJ SOLN
2.0000 mg | INTRAMUSCULAR | Status: DC | PRN
  Administered 2014-07-16: 2 mg via INTRAVENOUS

## 2014-07-16 MED ORDER — HYDROMORPHONE HCL 1 MG/ML IJ SOLN: 1.0000 mg | INTRAMUSCULAR | Status: DC | PRN

## 2014-07-16 MED ORDER — ONDANSETRON HCL 4 MG/2ML IJ SOLN
4.0000 mg | Freq: Four times a day (QID) | INTRAMUSCULAR | Status: DC | PRN
  Administered 2014-07-16 – 2014-07-18 (×5): 4 mg via INTRAVENOUS
  Filled 2014-07-16 (×5): qty 2

## 2014-07-16 MED ORDER — LACTATED RINGERS IV SOLN
INTRAVENOUS | Status: DC | PRN
  Administered 2014-07-16: 18:00:00 via INTRAVENOUS

## 2014-07-16 MED ORDER — ONDANSETRON HCL 4 MG/2ML IJ SOLN
INTRAMUSCULAR | Status: AC
  Filled 2014-07-16: qty 2

## 2014-07-16 MED ORDER — ONDANSETRON HCL 4 MG/2ML IJ SOLN
INTRAMUSCULAR | Status: DC | PRN
Start: 2014-07-16 — End: 2014-07-16
  Administered 2014-07-16: 4 mg via INTRAVENOUS

## 2014-07-16 MED ORDER — HYDROMORPHONE HCL 1 MG/ML IJ SOLN
2.0000 mg | INTRAMUSCULAR | Status: DC | PRN
  Administered 2014-07-16 – 2014-07-17 (×4): 2 mg via INTRAVENOUS
  Filled 2014-07-16 (×5): qty 2

## 2014-07-16 MED ORDER — ALPRAZOLAM 1 MG PO TABS
2.0000 mg | ORAL_TABLET | Freq: Three times a day (TID) | ORAL | Status: DC | PRN
  Administered 2014-07-17 – 2014-07-28 (×13): 2 mg via ORAL
  Filled 2014-07-16 (×15): qty 2

## 2014-07-16 MED ORDER — CITALOPRAM HYDROBROMIDE 20 MG PO TABS
20.0000 mg | ORAL_TABLET | Freq: Every day | ORAL | Status: DC
  Administered 2014-07-17 – 2014-07-30 (×14): 20 mg via ORAL
  Filled 2014-07-16 (×14): qty 1

## 2014-07-16 MED ORDER — MIDAZOLAM HCL 5 MG/5ML IJ SOLN
INTRAMUSCULAR | Status: DC | PRN
  Administered 2014-07-16: 2 mg via INTRAVENOUS

## 2014-07-16 MED ORDER — ZOLPIDEM TARTRATE 5 MG PO TABS
5.0000 mg | ORAL_TABLET | Freq: Every evening | ORAL | Status: DC | PRN
  Administered 2014-07-24: 5 mg via ORAL
  Filled 2014-07-16: qty 1

## 2014-07-16 MED ORDER — CLOTRIMAZOLE 10 MG MT TROC
10.0000 mg | Freq: Four times a day (QID) | OROMUCOSAL | Status: DC | PRN
  Filled 2014-07-16: qty 1

## 2014-07-16 MED ORDER — HYDROMORPHONE HCL 1 MG/ML IJ SOLN: 2.0000 mg | INTRAMUSCULAR | Status: DC | PRN

## 2014-07-16 MED ORDER — HYDROMORPHONE HCL 1 MG/ML IJ SOLN
2.0000 mg | INTRAMUSCULAR | Status: DC | PRN
  Administered 2014-07-16: 2 mg via INTRAVENOUS
  Filled 2014-07-16: qty 2

## 2014-07-16 MED ORDER — INSULIN ASPART 100 UNIT/ML ~~LOC~~ SOLN: 0.0000 [IU] | Freq: Every day | SUBCUTANEOUS | Status: DC

## 2014-07-16 MED ORDER — PROPOFOL 10 MG/ML IV BOLUS
INTRAVENOUS | Status: AC
  Filled 2014-07-16: qty 20

## 2014-07-16 MED ORDER — HYDROMORPHONE HCL 1 MG/ML IJ SOLN
1.0000 mg | INTRAMUSCULAR | Status: AC
  Administered 2014-07-16: 1 mg via INTRAVENOUS
  Filled 2014-07-16: qty 1

## 2014-07-16 MED ORDER — PROPOFOL 10 MG/ML IV BOLUS
INTRAVENOUS | Status: DC | PRN
  Administered 2014-07-16: 120 mg via INTRAVENOUS

## 2014-07-16 MED ORDER — HYDROCHLOROTHIAZIDE 25 MG PO TABS
25.0000 mg | ORAL_TABLET | Freq: Every day | ORAL | Status: DC
  Administered 2014-07-17 – 2014-07-30 (×3): 25 mg via ORAL
  Filled 2014-07-16 (×12): qty 1

## 2014-07-16 MED ORDER — IPRATROPIUM BROMIDE 0.02 % IN SOLN
2.5000 mL | Freq: Three times a day (TID) | RESPIRATORY_TRACT | Status: DC
  Administered 2014-07-17: 0.5 mg via RESPIRATORY_TRACT
  Filled 2014-07-16: qty 2.5

## 2014-07-16 MED ORDER — PIPERACILLIN-TAZOBACTAM 3.375 G IVPB 30 MIN
3.3750 g | Freq: Three times a day (TID) | INTRAVENOUS | Status: DC
  Filled 2014-07-16 (×6): qty 50

## 2014-07-16 MED ORDER — FUROSEMIDE 40 MG PO TABS
80.0000 mg | ORAL_TABLET | Freq: Every day | ORAL | Status: DC
  Administered 2014-07-18 – 2014-07-20 (×3): 80 mg via ORAL
  Administered 2014-07-21: 40 mg via ORAL
  Administered 2014-07-23 – 2014-07-29 (×3): 80 mg via ORAL
  Filled 2014-07-16 (×13): qty 2

## 2014-07-16 MED ORDER — ALBUTEROL SULFATE (2.5 MG/3ML) 0.083% IN NEBU
2.5000 mg | INHALATION_SOLUTION | RESPIRATORY_TRACT | Status: DC | PRN
Start: 2014-07-16 — End: 2014-07-30
  Administered 2014-07-17 – 2014-07-21 (×2): 2.5 mg via RESPIRATORY_TRACT
  Filled 2014-07-16 (×3): qty 3

## 2014-07-16 MED ORDER — MENTHOL 3 MG MT LOZG
1.0000 | LOZENGE | OROMUCOSAL | Status: DC | PRN
  Administered 2014-07-17: 3 mg via ORAL
  Filled 2014-07-16 (×3): qty 9

## 2014-07-16 MED ORDER — TECHNETIUM TC 99M MEBROFENIN IV KIT
5.4520 | PACK | Freq: Once | INTRAVENOUS | Status: AC | PRN
  Administered 2014-07-16: 5.452 via INTRAVENOUS

## 2014-07-16 MED ORDER — INSULIN ASPART 100 UNIT/ML ~~LOC~~ SOLN
0.0000 [IU] | Freq: Three times a day (TID) | SUBCUTANEOUS | Status: DC
  Administered 2014-07-17 (×2): 2 [IU] via SUBCUTANEOUS
  Administered 2014-07-18: 1 [IU] via SUBCUTANEOUS
  Administered 2014-07-18: 2 [IU] via SUBCUTANEOUS
  Administered 2014-07-19: 1 [IU] via SUBCUTANEOUS
  Administered 2014-07-20: 2 [IU] via SUBCUTANEOUS
  Administered 2014-07-23 – 2014-07-24 (×2): 1 [IU] via SUBCUTANEOUS
  Administered 2014-07-25: 2 [IU] via SUBCUTANEOUS
  Administered 2014-07-25 – 2014-07-29 (×8): 1 [IU] via SUBCUTANEOUS
  Filled 2014-07-16: qty 2
  Filled 2014-07-16 (×2): qty 1
  Filled 2014-07-16: qty 2
  Filled 2014-07-16 (×5): qty 1
  Filled 2014-07-16: qty 2
  Filled 2014-07-16 (×2): qty 1
  Filled 2014-07-16 (×3): qty 2
  Filled 2014-07-16 (×3): qty 1

## 2014-07-16 MED ORDER — SODIUM CHLORIDE 0.9 % IV SOLN: INTRAVENOUS | Status: DC

## 2014-07-16 NOTE — Progress Notes (Signed)
1 Day Post-Op   Subjective:  Patient is complaining of severe sudden onset of right upper quadrant right shoulder pain which is new over the last 30-40 minutes. She is crying out begging for assistance. Her vital signs remained stable. I have reviewed her operative note and yesterday's procedure raises the question of possible bile leak. There is no bile in the JP drain at the present time.  . There is no evidence for any other abdominal problem. Her bilirubin is still slightly elevated.  Vital signs in last 24 hours: Temp:  [97.5 F (36.4 C)-98.8 F (37.1 C)] 98 F (36.7 C) (07/13 0750) Pulse Rate:  [49-94] 72 (07/13 0750) Resp:  [11-20] 17 (07/13 0750) BP: (97-132)/(56-82) 100/62 mmHg (07/13 0750) SpO2:  [90 %-98 %] 94 % (07/13 0750) FiO2 (%):  [28 %] 28 % (07/12 1517) Last BM Date: 07/13/14  Intake/Output from previous day: 07/12 0701 - 07/13 0700 In: 2449 [P.O.:358; I.V.:800] Out: 1435 [Urine:900; Drains:460; Blood:75]  GI: Her abdomen is soft with serosanguineous drainage in the JP drain. She has hypoactive but present bowel sounds. Her chest is clear.  Lab Results:  CBC  Recent Labs  07/16/14 0350  WBC 11.4*  HGB 12.7  HCT 38.1  PLT 130*   CMP     Component Value Date/Time   NA 140 07/16/2014 0350   NA 142 03/27/2014 1042   K 3.8 07/16/2014 0350   K 3.5 03/27/2014 1042   CL 109 07/16/2014 0350   CL 101 03/27/2014 1042   CO2 27 07/16/2014 0350   CO2 27 03/27/2014 1042   GLUCOSE 174* 07/16/2014 0350   GLUCOSE 119* 03/27/2014 1042   BUN 9 07/16/2014 0350   BUN 16 03/27/2014 1042   CREATININE 0.79 07/16/2014 0350   CREATININE 0.73 03/27/2014 1042   CALCIUM 8.8* 07/16/2014 0350   CALCIUM 10.4* 03/27/2014 1042   PROT 7.5 07/16/2014 0350   PROT 9.4* 03/27/2014 1042   ALBUMIN 4.1 07/16/2014 0350   ALBUMIN 5.3* 03/27/2014 1042   AST 45* 07/16/2014 0350   AST 81* 03/27/2014 1042   ALT 33 07/16/2014 0350   ALT 58* 03/27/2014 1042   ALKPHOS 43 07/16/2014 0350    ALKPHOS 63 03/27/2014 1042   BILITOT 1.3* 07/16/2014 0350   GFRNONAA >60 07/16/2014 0350   GFRNONAA >60 03/27/2014 1042   GFRAA >60 07/16/2014 0350   GFRAA >60 03/27/2014 1042   PT/INR No results for input(s): LABPROT, INR in the last 72 hours.  Studies/Results: No results found.  Assessment/Plan: Her sudden onset of pain certainly suggests a possible bile leak. Her bilirubin remains slightly elevated at 1.3. We will see if we get her pain under control. We'll try to get a HIDA scan this morning for urgent evaluation of her bile duct. She has a drain in place so the only other therapeutic intervention might be an ERCP for duct stenting. I have explained this to the patient will see if we get a handle on her pain at this point.

## 2014-07-16 NOTE — Discharge Instructions (Signed)
Call if question or problem from a GI standpoint otherwise follow-up with me in one month to probably set up stent removal in 2-3 months and otherwise customary postop care per surgery team and if she does well may have sips of clear liquids tonight and tray of clear liquids in a.m. and advance diet per surgical team only

## 2014-07-16 NOTE — H&P (Signed)
Reason for Consult: Bile leak Referring Physician: [facility] surgery team  Lori Mckenzie is an 49 y.o. female.  HPI: Patient had her gallbladder out yesterday and had a bile leak today based on CT and HIDA scan and her case was extensively discussed with the surgical team and unfortunately the [facility] ERCP ist refused to do it today so I was asked to proceed and her history was thoroughly discussed and we reviewed her hospital course and her recent endoscopy and colonoscopy and she has not had any swallowing problems and today's pain feels different than her gallbladder pain which she said did come and go for months and she also complains of some hemorrhoids but no other specific complaint  Past Medical History  Diagnosis Date  . Brittle bone disease   . Asthma   . Thyroid disease   . Hypertension   . Back pain   . Diabetes mellitus without complication   . Chronic kidney disease   . Anxiety   . GERD (gastroesophageal reflux disease)   . Sleep apnea   . COPD (chronic obstructive pulmonary disease)   . Hypothyroidism   . Cervical disc disease   . TIA (transient ischemic attack)     Past Surgical History  Procedure Laterality Date  . Abdominal hysterectomy    . Tubal ligation    . Colonoscopy with propofol N/A 06/23/2014    Procedure: COLONOSCOPY WITH PROPOFOL;  Surgeon: Lollie Sails, MD;  Location: [facility] ENDOSCOPY;  Service: Endoscopy;  Laterality: N/A;  . Esophagogastroduodenoscopy N/A 06/23/2014    Procedure: ESOPHAGOGASTRODUODENOSCOPY (EGD);  Surgeon: Lollie Sails, MD;  Location: [facility] ENDOSCOPY;  Service: Endoscopy;  Laterality: N/A;  . Cholecystectomy N/A 07/15/2014    Procedure: LAPAROSCOPIC CHOLECYSTECTOMY with liver biopsy ;  Surgeon: Sherri Rad, MD;  Location: [facility] ORS;  Service: General;  Laterality: N/A;    Family History  Problem Relation Age of Onset  . Lung cancer Father   . Ulcers Father   . Heart disease Sister   . Ulcers Sister   . Heart disease Brother      Social History:  reports that she has never smoked. She does not have any smokeless tobacco history on file. She reports that she does not drink alcohol or use illicit drugs.  Allergies:  Allergies  Allergen Reactions  . Hydrocodone Itching    Other reaction(s): ITCHING  . Vicodin [Hydrocodone-Acetaminophen] Rash    Medications: I have reviewed the patient's current medications.  Results for orders placed or performed during the hospital encounter of 07/12/14 (from the past 48 hour(s))  Glucose, capillary     Status: Abnormal   Collection Time: 07/14/14  5:33 PM  Result Value Ref Range   Glucose-Capillary 127 (H) 65 - 99 mg/dL  Glucose, capillary     Status: Abnormal   Collection Time: 07/14/14 11:43 PM  Result Value Ref Range   Glucose-Capillary 101 (H) 65 - 99 mg/dL  Glucose, capillary     Status: Abnormal   Collection Time: 07/15/14  5:39 AM  Result Value Ref Range   Glucose-Capillary 105 (H) 65 - 99 mg/dL  Glucose, capillary     Status: Abnormal   Collection Time: 07/15/14  3:03 PM  Result Value Ref Range   Glucose-Capillary 136 (H) 65 - 99 mg/dL  Glucose, capillary     Status: Abnormal   Collection Time: 07/15/14  6:10 PM  Result Value Ref Range   Glucose-Capillary 188 (H) 65 - 99 mg/dL  CBC  Status: Abnormal   Collection Time: 07/16/14  3:50 AM  Result Value Ref Range   WBC 11.4 (H) 3.6 - 11.0 K/uL   RBC 3.96 3.80 - 5.20 MIL/uL   Hemoglobin 12.7 12.0 - 16.0 g/dL   HCT 38.1 35.0 - 47.0 %   MCV 96.2 80.0 - 100.0 fL   MCH 32.0 26.0 - 34.0 pg   MCHC 33.3 32.0 - 36.0 g/dL   RDW 13.3 11.5 - 14.5 %   Platelets 130 (L) 150 - 440 K/uL  Comprehensive metabolic panel     Status: Abnormal   Collection Time: 07/16/14  3:50 AM  Result Value Ref Range   Sodium 140 135 - 145 mmol/L   Potassium 3.8 3.5 - 5.1 mmol/L   Chloride 109 101 - 111 mmol/L   CO2 27 22 - 32 mmol/L   Glucose, Bld 174 (H) 65 - 99 mg/dL   BUN 9 6 - 20 mg/dL   Creatinine, Ser 0.79 0.44 - 1.00  mg/dL   Calcium 8.8 (L) 8.9 - 10.3 mg/dL   Total Protein 7.5 6.5 - 8.1 g/dL   Albumin 4.1 3.5 - 5.0 g/dL   AST 45 (H) 15 - 41 U/L   ALT 33 14 - 54 U/L   Alkaline Phosphatase 43 38 - 126 U/L   Total Bilirubin 1.3 (H) 0.3 - 1.2 mg/dL   GFR calc non Af Amer >60 >60 mL/min   GFR calc Af Amer >60 >60 mL/min    Comment: (NOTE) The eGFR has been calculated using the CKD EPI equation. This calculation has not been validated in all clinical situations. eGFR's persistently <60 mL/min signify possible Chronic Kidney Disease.    Anion gap 4 (L) 5 - 15  Glucose, capillary     Status: Abnormal   Collection Time: 07/16/14  4:56 AM  Result Value Ref Range   Glucose-Capillary 142 (H) 65 - 99 mg/dL  Glucose, capillary     Status: Abnormal   Collection Time: 07/16/14 11:56 AM  Result Value Ref Range   Glucose-Capillary 128 (H) 65 - 99 mg/dL   Comment 1 Notify RN     Nm Hepatobiliary Liver Func  07/16/2014   CLINICAL DATA:  Right upper quadrant abdominal pain with drainage from abdominal wounds status post cholecystectomy 2 days ago. Evaluate for bile leak.  EXAM: NUCLEAR MEDICINE HEPATOBILIARY IMAGING  TECHNIQUE: Sequential images of the abdomen were obtained out to 60 minutes following intravenous administration of radiopharmaceutical.  RADIOPHARMACEUTICALS:  5.45 mCi Tc-26m Choletec IV  COMPARISON:  Preoperative MRI 07/13/2014.  FINDINGS: Initial images demonstrate homogeneous hepatic activity. There is prompt opacification of the biliary system and small bowel. There is progressive accumulation of activity within the cholecystectomy bed with extension into the subhepatic space consistent with a bile leak. Some of this activity may be within a surgical drain if present. There is also accumulation of activity superior to the right hepatic lobe.  IMPRESSION: Findings are consistent with a bile leak with accumulation of activity in the cholecystectomy bed, subhepatic space and superior to the right hepatic  lobe. ERCP for biliary stenting recommended.  These results will be called to the ordering clinician or representative by the Radiologist Assistant, and communication documented in the PACS or zVision Dashboard.   Electronically Signed   By: WRichardean SaleM.D.   On: 07/16/2014 10:37    ROS negative except above Blood pressure 102/58, pulse 70, temperature 97.8 F (36.6 C), temperature source Oral, resp. rate 17, SpO2 98 %.  Physical Exam Vital signs stable afebrile no acute distress lungs are clear regular rate and rhythm abdomen is sore throughout but  Soft  occasional bowel sounds labs and x-rays reviewed Assessment/Plan: Bile leak in a patient with multiple medical problems Plan: The risks benefits methods and options of ERCP and success rate was discussed with the patient and her sister and will proceed this afternoon with further workup plans and recommendations pending those findings  [place] E 07/16/2014, 3:46 PM

## 2014-07-16 NOTE — Progress Notes (Signed)
HIDA scan performed this morning with the acute onset of right upper quadrant pain demonstrates what appears to be a postoperative bile leak. There is collection her radionuclide tracer in the subhepatic space and over the right lobe of the liver likely accounting for her abdominal and shoulder pain.  We do not have ERCP available at [facility] today. I have spoken with both services and they do not have physician available for that procedure. She does have a drain in place at present so I do not think any further surgery is indicated. I will attempt to contact one of the outside facilities in an effort to transfer her for an ERCP.

## 2014-07-16 NOTE — Op Note (Signed)
[facility] [place], [id number]   ERCP PROCEDURE REPORT  PATIENT: Lori Mckenzie, Lori Mckenzie  MR# :[id number] BIRTHDATE: 08/08/1965  GENDER: female ENDOSCOPIST: Clarene Essex, MD REFERRED BY: [place] [facility] PROCEDURE DATE:  07/16/2014 PROCEDURE:   ERCP with sphincterotomy/papillotomy and ERCP with stent placement ASA CLASS:    2 INDICATIONS: bile leak MEDICATIONS:    general anesthesia TOPICAL ANESTHETIC:  none  DESCRIPTION OF PROCEDURE:   After the risks benefits and alternatives of the procedure were thoroughly explained, informed consent was obtained.  The ercp pentax [id number]  endoscope was introduced through the mouth and advanced to the second portion of the duodenum . a small periampullary diverticulum and a normal ampulla was brought into view and using the triple lumen sphincterotome loaded with the JAG Jagwire deep selective cannulation was obtained unfortunately the wire went towards the pancreas and the sphincterotome was repositioned and 2 more wire advancements towards the pancreas was donebut then we repositioned the sphincterotome again and deep selective CBD cannulation was obtained and contrast was injected which confirmed an obvious leak probably at the cystic duct stump and the wire was advanced into the intrahepatics and we proceeded with a small to medium size sphincterotomy in the customary fashion and then inserted a 10 French 5 cm stent with excellent biliary drainage and the wire and the introducer was removed and the patient tolerated the procedure well there was no obvious immediate complicationEstimated blood loss is zero unless otherwise noted in this procedure report.       COMPLICATIONS:none  ENDOSCOPIC IMPRESSION:1.normal ampulla with small periampullary diverticulum 2.3 wire advancements towards the pancreas without any pancreatic injection 3. Bile leak probably at cystic duct stump status post small to medium size  sphincterotomy and 10 French 5 cm stent placement as above 4. Otherwise normal ERCP with adequate biliary drainage at the end of the procedure  RECOMMENDATIONS:customary post-ERCP orders if doing okay may return to [facility] and I will be available the rest of the week and through the weekend if any questions or problems otherwise would recommend stent removal in 2-3 months and I'm happy to do that here in [place] if needed and please send me a discharge copy so I can tell how she does from her procedure and her surgery and as an aside x-ray documentation was saved and should be available for your review on the epic computer     _______________________________ eSigned:  Clarene Essex, MD 07/16/2014 7:07 PM   CC:

## 2014-07-16 NOTE — Anesthesia Postprocedure Evaluation (Signed)
  Anesthesia Post-op Note  Patient: Lori Mckenzie  Procedure(s) Performed: Procedure(s) (LRB): ENDOSCOPIC RETROGRADE CHOLANGIOPANCREATOGRAPHY (ERCP) (N/A)  Patient Location: PACU  Anesthesia Type: General  Level of Consciousness: awake and alert   Airway and Oxygen Therapy: Patient Spontanous Breathing  Post-op Pain: mild  Post-op Assessment: Post-op Vital signs reviewed, Patient's Cardiovascular Status Stable, Respiratory Function Stable, Patent Airway and No signs of Nausea or vomiting  Last Vitals:  Filed Vitals:   07/16/14 1950  BP: 159/65  Pulse: 66  Temp:   Resp: 18    Post-op Vital Signs: stable   Complications: No apparent anesthesia complications. Patient will be carelinked back to [facility] per plan.

## 2014-07-16 NOTE — Anesthesia Preprocedure Evaluation (Addendum)
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 Dental  (+) Dental Advisory Given, Missing Missing right upper front lateral tooth:   Pulmonary asthma , sleep apnea, Continuous Positive Airway Pressure Ventilation and Oxygen sleep apnea , COPD COPD inhaler and oxygen dependent,  Uses 1.5-2 L/minutes [place] PRN. breath sounds clear to auscultation  Pulmonary exam normal       Cardiovascular Exercise Tolerance: Good hypertension, On Medications negative cardio ROS Normal cardiovascular examRhythm:regular Rate:Normal     Neuro/Psych Anxiety TIAnegative neurological ROS  negative psych ROS   GI/Hepatic negative GI ROS, GERD-  Medicated and Controlled,(+) Hepatitis -, Unspecified  Endo/Other  diabetes, Well Controlled, Type 2, Oral Hypoglycemic AgentsHypothyroidism Morbid obesity  Renal/GU negative Renal ROS  negative genitourinary   Musculoskeletal   Abdominal (+) + obese,   Peds  Hematology negative hematology ROS (+)   Anesthesia Other Findings   Reproductive/Obstetrics negative OB ROS                         Anesthesia Physical Anesthesia Plan  ASA: III and emergent  Anesthesia Plan: General   Post-op Pain Management:    Induction: Intravenous  Airway Management Planned: Oral ETT  Additional Equipment:   Intra-op Plan:   Post-operative Plan: Extubation in OR  Informed Consent: I have reviewed the patients History and Physical, chart, labs and discussed the procedure including the risks, benefits and alternatives for the proposed anesthesia with the patient or authorized representative who has indicated his/her  understanding and acceptance.   Dental Advisory Given  Plan Discussed with: CRNA and Surgeon  Anesthesia Plan Comments: (A glidescope was used yesterday at [facility]. We will have one in the room.)       Anesthesia Quick Evaluation

## 2014-07-16 NOTE — Anesthesia Procedure Notes (Signed)
Procedure Name: Intubation Date/Time: 07/16/2014 6:22 PM Performed by: Glory Buff Pre-anesthesia Checklist: Patient identified, Emergency Drugs available, Suction available and Patient being monitored Patient Re-evaluated:Patient Re-evaluated prior to inductionOxygen Delivery Method: Circle System Utilized Preoxygenation: Pre-oxygenation with 100% oxygen Intubation Type: IV induction Ventilation: Mask ventilation without difficulty Laryngoscope Size: Miller and 3 Grade View: Grade I Tube type: Oral Tube size: 7.5 mm Number of attempts: 1 Airway Equipment and Method: Stylet and Oral airway Placement Confirmation: ETT inserted through vocal cords under direct vision,  positive ETCO2 and breath sounds checked- equal and bilateral Secured at: 20 cm Tube secured with: Tape Dental Injury: Teeth and Oropharynx as per pre-operative assessment

## 2014-07-16 NOTE — Progress Notes (Signed)
[place] [facility] [place] NAME: Marlon Vonruden    MR#:  [id number]  DATE OF BIRTH:  27-Sep-1965  SUBJECTIVE:  Persistent abdominal pain since she woke up this morning  REVIEW OF SYSTEMS:   Review of Systems  Constitutional: Negative for fever, chills and weight loss.  HENT: Negative for ear discharge, ear pain and nosebleeds.   Eyes: Negative for blurred vision, pain and discharge.  Respiratory: Negative for sputum production, shortness of breath, wheezing and stridor.   Cardiovascular: Negative for chest pain, palpitations, orthopnea and PND.  Gastrointestinal: Positive for nausea and abdominal pain. Negative for vomiting and diarrhea.  Genitourinary: Negative for urgency and frequency.  Musculoskeletal: Negative for back pain and joint pain.  Neurological: Negative for sensory change, speech change, focal weakness and weakness.  Psychiatric/Behavioral: Negative for depression and hallucinations. The patient is nervous/anxious.   All other systems reviewed and are negative.  Tolerating Diet: on liquids  DRUG ALLERGIES:   Allergies  Allergen Reactions  . Hydrocodone Itching    Other reaction(s): ITCHING  . Vicodin [Hydrocodone-Acetaminophen] Rash    VITALS:  Blood pressure 100/62, pulse 72, temperature 98 F (36.7 C), temperature source Oral, resp. rate 17, height 5' 2"  (1.575 m), weight 113.399 kg (250 lb), SpO2 94 %.  PHYSICAL EXAMINATION:   Physical Exam  Constitutional: She is oriented to person, place, and time and well-developed, well-nourished, and in no distress.  HENT:  Head: Normocephalic and atraumatic.  Eyes: Conjunctivae and EOM are normal. Pupils are equal, round, and reactive to light.  Neck: Normal range of motion. Neck supple. No tracheal deviation present. No thyromegaly present.  Cardiovascular: Normal rate, regular rhythm and normal heart sounds.   Pulmonary/Chest: Effort normal and breath sounds normal. No  respiratory distress. She has no wheezes. She exhibits no tenderness.  Abdominal: Soft. Bowel sounds are normal. She exhibits no distension. There is tenderness (right upper quadrant).  Musculoskeletal: Normal range of motion.  Neurological: She is alert and oriented to person, place, and time. No cranial nerve deficit.  Skin: Skin is warm and dry. No rash noted.  Psychiatric: Mood and affect normal.   LABORATORY PANEL:   CBC  Recent Labs Lab 07/16/14 0350  WBC 11.4*  HGB 12.7  HCT 38.1  PLT 130*    Chemistries   Recent Labs Lab 07/16/14 0350  NA 140  K 3.8  CL 109  CO2 27  GLUCOSE 174*  BUN 9  CREATININE 0.79  CALCIUM 8.8*  AST 45*  ALT 33  ALKPHOS 43  BILITOT 1.3*   RADIOLOGY:  Nm Hepatobiliary Liver Func  07/16/2014   CLINICAL DATA:  Right upper quadrant abdominal pain with drainage from abdominal wounds status post cholecystectomy 2 days ago. Evaluate for bile leak.  EXAM: NUCLEAR MEDICINE HEPATOBILIARY IMAGING  TECHNIQUE: Sequential images of the abdomen were obtained out to 60 minutes following intravenous administration of radiopharmaceutical.  RADIOPHARMACEUTICALS:  5.45 mCi Tc-59m Choletec IV  COMPARISON:  Preoperative MRI 07/13/2014.  FINDINGS: Initial images demonstrate homogeneous hepatic activity. There is prompt opacification of the biliary system and small bowel. There is progressive accumulation of activity within the cholecystectomy bed with extension into the subhepatic space consistent with a bile leak. Some of this activity may be within a surgical drain if present. There is also accumulation of activity superior to the right hepatic lobe.  IMPRESSION: Findings are consistent with a bile leak with accumulation of activity in the cholecystectomy bed, subhepatic space  and superior to the right hepatic lobe. ERCP for biliary stenting recommended.  These results will be called to the ordering clinician or representative by the Radiologist Assistant, and  communication documented in the PACS or zVision Dashboard.   Electronically Signed   By: Richardean Sale M.D.   On: 07/16/2014 10:37   ASSESSMENT AND PLAN:  49 y.o. female who presents with right upper quadrant and epigastric abdominal pain with nausea and vomiting. Patient states that she's been dealing with these symptoms intermittently for the past couple months, and has had workup for the same.  * Right UQ pain with intractable nausea and vomitng and abnormal USG abdomen with possible Choledocholithiasis  -ultrasound imaging showing common bile duct dilatation and biliary sludge.  - pain control and antiemetics for nausea and vomiting control.  -negative MRCP - -pt is s/p Lap Chole July 13th by dr Marina Gravel -now has positive biliary leak on Hida scan from this am . Dr Pat Patrick working on getting pt transferred to get ERCP with stent placement  *  Type 2 diabetes mellitus - sliding scale insulin with fingerstick glucose checks every 6 hours for now   * COPD (chronic obstructive pulmonary disease) - continue home inhalers.  * HTN (hypertension) - controlled, continue home meds.  * OSA on CPAP - CPAP daily at bedtime   * GERD (gastroesophageal reflux disease) - equivalent home dose PPI  * Anxiety - home medications  * Thrombocytopenia: Hold off antiplt agents or lovenox, would do SCD-TEDs  Case discussed with Dr Pat Patrick, pt's husband Management plans discussed with the patient, family and they are in agreement.  CODE STATUS: full  DVT Prophylaxis: TEDs and SCD due to low plt  TOTAL TIME TAKING CARE OF THIS PATIENT: 35 minutes.  >50% time spent on counselling and coordination of care  Clemma Johnsen M.D on 07/16/2014 at 12:21 PM  Between 7am to 6pm - Pager - [phone]  After 6pm go to [email or web address] - password [email or web address] [facility]  Office  [phone]  CC: Primary care physician; Ashkin, Neldon Labella, MD

## 2014-07-16 NOTE — Transfer of Care (Signed)
Immediate Anesthesia Transfer of Care Note  Patient: Lori Mckenzie  Procedure(s) Performed: Procedure(s): ENDOSCOPIC RETROGRADE CHOLANGIOPANCREATOGRAPHY (ERCP) (N/A)  Patient Location: endoscopy  Anesthesia Type:General  Level of Consciousness:  sedated, patient cooperative and responds to stimulation  Airway & Oxygen Therapy:Patient Spontanous Breathing and Patient connected to face mask oxgen  Post-op Assessment:  Report given to endo RN and Post -op Vital signs reviewed and stable  Post vital signs:  Reviewed and stable  Last Vitals:  Filed Vitals:   07/16/14 1913  BP: 157/84  Pulse: 96  Temp: 37.3 C  Resp: 18    Complications: No apparent anesthesia complications

## 2014-07-16 NOTE — H&P (Signed)
[place] at [place] NAME: Lori Mckenzie    MR#:  [id number]  DATE OF BIRTH:  November 04, 1965  DATE OF ADMISSION:  07/16/2014  PRIMARY CARE PHYSICIAN: Ashkin, Neldon Labella, MD   REQUESTING/REFERRING PHYSICIAN: NONE, readmission to Dr. Chauncey Cruel Patel's service after ERCP at [place]:  No chief complaint on file.   HISTORY OF PRESENT ILLNESS:  Lori Mckenzie is a 49 y.o. female who presented with right upper quadrant and epigastric abdominal pain with nausea and vomiting on 07/12/14. She had a lap cholecystectomy on 07/16/14 but was found to have biliary leak this morning and was transferred to [facility] for ERCP.  She has returned this evening in stable condition, reports pain has decreased. Still with significant drainage into JP drain and around drain site.  PAST MEDICAL HISTORY:   Past Medical History  Diagnosis Date  . Brittle bone disease   . Asthma   . Thyroid disease   . Hypertension   . Back pain   . Diabetes mellitus without complication   . Chronic kidney disease   . Anxiety   . GERD (gastroesophageal reflux disease)   . Sleep apnea   . COPD (chronic obstructive pulmonary disease)   . Hypothyroidism   . Cervical disc disease   . TIA (transient ischemic attack)     PAST SURGICAL HISTORY:   Past Surgical History  Procedure Laterality Date  . Abdominal hysterectomy    . Tubal ligation    . Colonoscopy with propofol N/A 06/23/2014    Procedure: COLONOSCOPY WITH PROPOFOL;  Surgeon: Lollie Sails, MD;  Location: [facility] ENDOSCOPY;  Service: Endoscopy;  Laterality: N/A;  . Esophagogastroduodenoscopy N/A 06/23/2014    Procedure: ESOPHAGOGASTRODUODENOSCOPY (EGD);  Surgeon: Lollie Sails, MD;  Location: [facility] ENDOSCOPY;  Service: Endoscopy;  Laterality: N/A;  . Cholecystectomy N/A 07/15/2014    Procedure: LAPAROSCOPIC CHOLECYSTECTOMY with liver biopsy ;  Surgeon: Sherri Rad, MD;  Location: [facility] ORS;  Service: General;   Laterality: N/A;    SOCIAL HISTORY:   History  Substance Use Topics  . Smoking status: Never Smoker   . Smokeless tobacco: Not on file  . Alcohol Use: No     Comment: occ    FAMILY HISTORY:   Family History  Problem Relation Age of Onset  . Lung cancer Father   . Ulcers Father   . Heart disease Sister   . Ulcers Sister   . Heart disease Brother     DRUG ALLERGIES:   Allergies  Allergen Reactions  . Hydrocodone Itching    Other reaction(s): ITCHING  . Vicodin [Hydrocodone-Acetaminophen] Rash    REVIEW OF SYSTEMS:   Review of Systems  Constitutional: Negative for fever.  Respiratory: Negative for shortness of breath.   Cardiovascular: Negative for chest pain and palpitations.  Gastrointestinal: Positive for abdominal pain. Negative for nausea and vomiting.  Genitourinary: Negative for dysuria.    MEDICATIONS AT HOME:   Prior to Admission medications   Medication Sig Start Date End Date Taking? Authorizing Provider  albuterol (PROVENTIL HFA;VENTOLIN HFA) 108 (90 BASE) MCG/ACT inhaler Inhale 2 puffs into the lungs every 4 (four) hours as needed for wheezing or shortness of breath.    Historical Provider, MD  alprazolam Duanne Moron) 2 MG tablet Take 2 mg by mouth 3 (three) times daily as needed for anxiety.     Historical Provider, MD  citalopram (CELEXA) 20 MG tablet Take 20 mg by mouth daily.  Historical Provider, MD  clotrimazole (MYCELEX) 10 MG troche Take 10 mg by mouth 4 (four) times daily as needed (mouth dryness).    Historical Provider, MD  fluticasone (FLONASE) 50 MCG/ACT nasal spray Place 1 spray into both nostrils daily as needed (dryness).     Historical Provider, MD  Fluticasone-Salmeterol (ADVAIR) 100-50 MCG/DOSE AEPB Inhale 1 puff into the lungs 2 (two) times daily.    Historical Provider, MD  furosemide (LASIX) 40 MG tablet Take 80 mg by mouth daily.    Historical Provider, MD  gabapentin (NEURONTIN) 100 MG capsule Take 300 mg by mouth 4 (four) times  daily as needed (pain).    Historical Provider, MD  hydrochlorothiazide (HYDRODIURIL) 25 MG tablet Take 25 mg by mouth daily.    Historical Provider, MD  ibuprofen (ADVIL,MOTRIN) 800 MG tablet Take 800 mg by mouth every 8 (eight) hours as needed for moderate pain.     Historical Provider, MD  ipratropium (ATROVENT HFA) 17 MCG/ACT inhaler Inhale 2 puffs into the lungs every 8 (eight) hours.    Historical Provider, MD  ipratropium-albuterol (DUONEB) 0.5-2.5 (3) MG/3ML SOLN Take 6 mLs by nebulization every 6 (six) hours as needed (SOB).    Historical Provider, MD  metFORMIN (GLUCOPHAGE) 1000 MG tablet Take 1,000 mg by mouth 2 (two) times daily. If blood sugar is higher than 105    Historical Provider, MD  metFORMIN (GLUCOPHAGE) 500 MG tablet Take 500 mg by mouth 2 (two) times daily. If sugar is between 90-105    Historical Provider, MD  methocarbamol (ROBAXIN) 500 MG tablet Take 500 mg by mouth 2 (two) times daily as needed for muscle spasms.  03/26/14   Historical Provider, MD  NON FORMULARY at bedtime. cpap    Historical Provider, MD  omeprazole (PRILOSEC) 20 MG capsule Take 40 mg by mouth 2 (two) times daily as needed (acid reflux).    Historical Provider, MD  Oxycodone HCl 20 MG TABS Take 1 tablet by mouth 2 (two) times daily.    Historical Provider, MD  OXYGEN Inhale into the lungs 4 (four) times daily.    Historical Provider, MD  rizatriptan (MAXALT) 10 MG tablet Take 10 mg by mouth as needed for migraine. May repeat in 2 hours if needed    Historical Provider, MD  spironolactone (ALDACTONE) 50 MG tablet Take 50 mg by mouth daily.    Historical Provider, MD  zolpidem (AMBIEN) 10 MG tablet Take 10 mg by mouth at bedtime as needed for sleep.     Historical Provider, MD      VITAL SIGNS:  There were no vitals taken for this visit.  PHYSICAL EXAMINATION:  GENERAL:  49 y.o.-year-old patient lying in the bed with no acute distress.  EYES: Pupils equal, round, reactive to light and accommodation.  No scleral icterus. Extraocular muscles intact.  HEENT: Head atraumatic, normocephalic. Oropharynx and nasopharynx clear. MMM NECK:  Supple, no jugular venous distention. No thyroid enlargement, no tenderness.  LUNGS: Normal breath sounds bilaterally, no wheezing, rales, rhonchi or crepitation. No use of accessory muscles of respiration.  CARDIOVASCULAR: S1, S2 normal. No murmurs, rubs, or gallops.  ABDOMEN: Soft, tender at incision sites, otherwise non tender, nondistended. Bowel sounds present. No organomegaly or mass. JP drain from right upper quadrant. Lap incision in umbilicus very tender EXTREMITIES: No pedal edema, cyanosis, or clubbing.  NEUROLOGIC: Cranial nerves II through XII are intact.   PSYCHIATRIC: The patient is alert and oriented x 3.  SKIN: No obvious rash, lesion, or  ulcer. Tattoos  LABORATORY PANEL:   CBC  Recent Labs Lab 07/16/14 0350  WBC 11.4*  HGB 12.7  HCT 38.1  PLT 130*   ------------------------------------------------------------------------------------------------------------------  Chemistries   Recent Labs Lab 07/16/14 0350  NA 140  K 3.8  CL 109  CO2 27  GLUCOSE 174*  BUN 9  CREATININE 0.79  CALCIUM 8.8*  AST 45*  ALT 33  ALKPHOS 43  BILITOT 1.3*   ------------------------------------------------------------------------------------------------------------------  Cardiac Enzymes  Recent Labs Lab 07/12/14 1857  TROPONINI <0.03   ------------------------------------------------------------------------------------------------------------------  RADIOLOGY:  Nm Hepatobiliary Liver Func  07/16/2014   CLINICAL DATA:  Right upper quadrant abdominal pain with drainage from abdominal wounds status post cholecystectomy 2 days ago. Evaluate for bile leak.  EXAM: NUCLEAR MEDICINE HEPATOBILIARY IMAGING  TECHNIQUE: Sequential images of the abdomen were obtained out to 60 minutes following intravenous administration of radiopharmaceutical.   RADIOPHARMACEUTICALS:  5.45 mCi Tc-71m Choletec IV  COMPARISON:  Preoperative MRI 07/13/2014.  FINDINGS: Initial images demonstrate homogeneous hepatic activity. There is prompt opacification of the biliary system and small bowel. There is progressive accumulation of activity within the cholecystectomy bed with extension into the subhepatic space consistent with a bile leak. Some of this activity may be within a surgical drain if present. There is also accumulation of activity superior to the right hepatic lobe.  IMPRESSION: Findings are consistent with a bile leak with accumulation of activity in the cholecystectomy bed, subhepatic space and superior to the right hepatic lobe. ERCP for biliary stenting recommended.  These results will be called to the ordering clinician or representative by the Radiologist Assistant, and communication documented in the PACS or zVision Dashboard.   Electronically Signed   By: WRichardean SaleM.D.   On: 07/16/2014 10:37   Dg Ercp Biliary & Pancreatic Ducts  07/16/2014   CLINICAL DATA:  49year old female with biliary leak status post cholecystectomy  EXAM: ERCP and stent placement  TECHNIQUE: Multiple spot images obtained with the fluoroscopic device and submitted for interpretation post-procedure.  FLUOROSCOPY TIME:  1 minutes 50 seconds fluoro time reported. Please see operative note for complete detail  COMPARISON:  Abdominal MRI 07/13/2014 ; nuclear medicine HIDA scan 07/16/2014  FINDINGS: A total of 6 intraoperative spot images demonstrate cannulation of the common bile duct and placement of a plastic biliary stent with decompression of the biliary tree. There appears to be some extravasation of contrast material possibly from the cystic duct remanent.  IMPRESSION: ERCP and placement of a plastic biliary stent.  There appears to be some extravasation of contrast material, possibly from the cystic duct remnant.  These images were submitted for radiologic interpretation only.  Please see the procedural report for the amount of contrast and the fluoroscopy time utilized.   Electronically Signed   By: HJacqulynn CadetM.D.   On: 07/16/2014 19:54    EKG:   Orders placed or performed during the hospital encounter of 07/12/14  . ED EKG   (only if pt is 51y.o. or older and pain is above umbilicus)  . ED EKG   (only if pt is 49y.o. or older and pain is above umbilicus)  . EKG 12-Lead  . EKG 12-Lead    IMPRESSION AND PLAN:   1) cholycystitis: S/p lap choly with post op biliar leak now s/p ERCP with stent placed. Seems to be more comfortable. Lots of fluid leaking, I have discussed with Dr. SMinna Antisand will continue dressing changes. Surgery to see in am.  Continue  pain and nausea control.  Will re order prior medications/orders for DM, COPD, OSA, HTN, GERD  All the records are reviewed and case discussed with ED provider. Management plans discussed with the patient, family and they are in agreement.  CODE STATUS: FULL  TOTAL TIME TAKING CARE OF THIS PATIENT: 35 minutes.  Greater than 50% of time spent in coordination of care and counseling.  Myrtis Ser M.D on 07/16/2014 at 10:29 PM  Between 7am to 6pm - Pager - [phone]  After 6pm go to [email or web address] - password [email or web address] [place] [facility]  Office  [phone]  CC: Primary care physician; Ashkin, Neldon Labella, MD

## 2014-07-16 NOTE — Progress Notes (Signed)
Pt ERCP went well. Stent placement under general anesthesia. [facility] to transport her back to [facility]. Report given to Iran Sizer, Paramedic.

## 2014-07-17 DIAGNOSIS — K9189 Other postprocedural complications and disorders of digestive system: Secondary | ICD-10-CM

## 2014-07-17 DIAGNOSIS — K838 Other specified diseases of biliary tract: Secondary | ICD-10-CM

## 2014-07-17 HISTORY — DX: Other specified diseases of biliary tract: K83.8

## 2014-07-17 HISTORY — DX: Other specified diseases of biliary tract: K91.89

## 2014-07-17 LAB — COMPREHENSIVE METABOLIC PANEL
ALT: 25 U/L (ref 14–54)
AST: 30 U/L (ref 15–41)
Albumin: 3.5 g/dL (ref 3.5–5.0)
Alkaline Phosphatase: 39 U/L (ref 38–126)
Anion gap: 8 (ref 5–15)
BUN: 10 mg/dL (ref 6–20)
CO2: 31 mmol/L (ref 22–32)
Calcium: 8.7 mg/dL — ABNORMAL LOW (ref 8.9–10.3)
Chloride: 100 mmol/L — ABNORMAL LOW (ref 101–111)
Creatinine, Ser: 0.7 mg/dL (ref 0.44–1.00)
GFR calc Af Amer: >60 mL/min (ref >60)
GFR calc non Af Amer: >60 mL/min (ref >60)
Glucose, Bld: 123 mg/dL — ABNORMAL HIGH (ref 65–99)
Potassium: 3.4 mmol/L — ABNORMAL LOW (ref 3.5–5.1)
Sodium: 139 mmol/L (ref 135–145)
Total Bilirubin: 1.3 mg/dL — ABNORMAL HIGH (ref 0.3–1.2)
Total Protein: 6.6 g/dL (ref 6.5–8.1)

## 2014-07-17 LAB — GLUCOSE, CAPILLARY
Glucose-Capillary: 119 mg/dL — ABNORMAL HIGH (ref 65–99)
Glucose-Capillary: 122 mg/dL — ABNORMAL HIGH (ref 65–99)
Glucose-Capillary: 151 mg/dL — ABNORMAL HIGH (ref 65–99)
Glucose-Capillary: 160 mg/dL — ABNORMAL HIGH (ref 65–99)
Glucose-Capillary: 96 mg/dL (ref 65–99)

## 2014-07-17 LAB — CBC
HCT: 35.1 % (ref 35.0–47.0)
Hemoglobin: 11.9 g/dL — ABNORMAL LOW (ref 12.0–16.0)
MCH: 32.1 pg (ref 26.0–34.0)
MCHC: 33.9 g/dL (ref 32.0–36.0)
MCV: 94.7 fL (ref 80.0–100.0)
Platelets: 133 10*3/uL — ABNORMAL LOW (ref 150–440)
RBC: 3.71 MIL/uL — ABNORMAL LOW (ref 3.80–5.20)
RDW: 13.7 % (ref 11.5–14.5)
WBC: 9.2 10*3/uL (ref 3.6–11.0)

## 2014-07-17 MED ORDER — IPRATROPIUM BROMIDE 0.02 % IN SOLN
2.5000 mL | Freq: Three times a day (TID) | RESPIRATORY_TRACT | Status: DC
  Administered 2014-07-17 – 2014-07-27 (×25): 0.5 mg via RESPIRATORY_TRACT
  Filled 2014-07-17 (×33): qty 2.5

## 2014-07-17 MED ORDER — BOOST / RESOURCE BREEZE PO LIQD
1.0000 | Freq: Three times a day (TID) | ORAL | Status: DC
  Administered 2014-07-17 – 2014-07-30 (×30): 1 via ORAL

## 2014-07-17 MED ORDER — HYDROMORPHONE HCL 1 MG/ML IJ SOLN
2.0000 mg | INTRAMUSCULAR | Status: DC | PRN
Start: 2014-07-17 — End: 2014-07-18
  Administered 2014-07-17 – 2014-07-18 (×9): 2 mg via INTRAVENOUS
  Filled 2014-07-17 (×9): qty 2

## 2014-07-17 MED ORDER — SODIUM CHLORIDE 0.9 % IV SOLN
INTRAVENOUS | Status: DC
  Administered 2014-07-17: 02:00:00 via INTRAVENOUS

## 2014-07-17 MED ORDER — OXYCODONE-ACETAMINOPHEN 5-325 MG PO TABS
1.0000 | ORAL_TABLET | Freq: Four times a day (QID) | ORAL | Status: DC | PRN
  Administered 2014-07-17: 1 via ORAL
  Administered 2014-07-18: 2 via ORAL
  Filled 2014-07-17: qty 1
  Filled 2014-07-17: qty 2

## 2014-07-17 MED ORDER — KCL IN DEXTROSE-NACL 40-5-0.45 MEQ/L-%-% IV SOLN
INTRAVENOUS | Status: DC
  Administered 2014-07-17 – 2014-07-18 (×3): via INTRAVENOUS
  Filled 2014-07-17 (×7): qty 1000

## 2014-07-17 MED ORDER — LIDOCAINE VISCOUS 2 % MT SOLN
15.0000 mL | OROMUCOSAL | Status: DC | PRN
Start: 2014-07-17 — End: 2014-07-30
  Administered 2014-07-17: 15 mL via OROMUCOSAL
  Filled 2014-07-17 (×2): qty 15

## 2014-07-17 MED ORDER — KETOROLAC TROMETHAMINE 30 MG/ML IJ SOLN
30.0000 mg | Freq: Once | INTRAMUSCULAR | Status: AC
  Administered 2014-07-17: 30 mg via INTRAVENOUS
  Filled 2014-07-17: qty 1

## 2014-07-17 NOTE — Progress Notes (Signed)
[place] at [place] NAME: Lori Mckenzie    MR#:  [id number]  DATE OF BIRTH:  1965-09-03  SUBJECTIVE:  S/p ERCP and stent placement at [facility] health y'day. C/o pain and sore throat  REVIEW OF SYSTEMS:   Review of Systems  Constitutional: Negative for fever, chills and weight loss.  HENT: Negative for ear discharge, ear pain and nosebleeds.   Eyes: Negative for blurred vision, pain and discharge.  Respiratory: Negative for sputum production, shortness of breath, wheezing and stridor.   Cardiovascular: Negative for chest pain, palpitations, orthopnea and PND.  Gastrointestinal: Positive for nausea and abdominal pain. Negative for vomiting and diarrhea.  Genitourinary: Negative for urgency and frequency.  Musculoskeletal: Negative for back pain and joint pain.  Neurological: Negative for sensory change, speech change, focal weakness and weakness.  Psychiatric/Behavioral: Negative for depression and hallucinations. The patient is nervous/anxious.   All other systems reviewed and are negative.  Tolerating Diet: on liquids  DRUG ALLERGIES:   Allergies  Allergen Reactions  . Hydrocodone Itching    Other reaction(s): ITCHING  . Vicodin [Hydrocodone-Acetaminophen] Rash    VITALS:  Blood pressure 124/59, pulse 81, temperature 99 F (37.2 C), temperature source Oral, resp. rate 19, SpO2 93 %.  PHYSICAL EXAMINATION:   Physical Exam  Constitutional: She is oriented to person, place, and time and well-developed, well-nourished, and in no distress.  HENT:  Head: Normocephalic and atraumatic.  Eyes: Conjunctivae and EOM are normal. Pupils are equal, round, and reactive to light.  Neck: Normal range of motion. Neck supple. No tracheal deviation present. No thyromegaly present.  Cardiovascular: Normal rate, regular rhythm and normal heart sounds.   Pulmonary/Chest: Effort normal and breath sounds normal. No respiratory distress. She has no  wheezes. She exhibits no tenderness.  Abdominal: Soft. Bowel sounds are normal. She exhibits no distension. There is tenderness (right upper quadrant).  Drain +  Musculoskeletal: Normal range of motion.  Neurological: She is alert and oriented to person, place, and time. No cranial nerve deficit.  Skin: Skin is warm and dry. No rash noted.  Psychiatric: Mood and affect normal.   LABORATORY PANEL:   CBC  Recent Labs Lab 07/17/14 0144  WBC 9.2  HGB 11.9*  HCT 35.1  PLT 133*    Chemistries   Recent Labs Lab 07/17/14 0144  NA 139  K 3.4*  CL 100*  CO2 31  GLUCOSE 123*  BUN 10  CREATININE 0.70  CALCIUM 8.7*  AST 30  ALT 25  ALKPHOS 39  BILITOT 1.3*   RADIOLOGY:  Nm Hepatobiliary Liver Func  07/16/2014   CLINICAL DATA:  Right upper quadrant abdominal pain with drainage from abdominal wounds status post cholecystectomy 2 days ago. Evaluate for bile leak.  EXAM: NUCLEAR MEDICINE HEPATOBILIARY IMAGING  TECHNIQUE: Sequential images of the abdomen were obtained out to 60 minutes following intravenous administration of radiopharmaceutical.  RADIOPHARMACEUTICALS:  5.45 mCi Tc-50m Choletec IV  COMPARISON:  Preoperative MRI 07/13/2014.  FINDINGS: Initial images demonstrate homogeneous hepatic activity. There is prompt opacification of the biliary system and small bowel. There is progressive accumulation of activity within the cholecystectomy bed with extension into the subhepatic space consistent with a bile leak. Some of this activity may be within a surgical drain if present. There is also accumulation of activity superior to the right hepatic lobe.  IMPRESSION: Findings are consistent with a bile leak with accumulation of activity in the cholecystectomy bed, subhepatic space and superior  to the right hepatic lobe. ERCP for biliary stenting recommended.  These results will be called to the ordering clinician or representative by the Radiologist Assistant, and communication documented in  the PACS or zVision Dashboard.   Electronically Signed   By: Richardean Sale M.D.   On: 07/16/2014 10:37   Dg Ercp Biliary & Pancreatic Ducts  07/16/2014   CLINICAL DATA:  49 year old female with biliary leak status post cholecystectomy  EXAM: ERCP and stent placement  TECHNIQUE: Multiple spot images obtained with the fluoroscopic device and submitted for interpretation post-procedure.  FLUOROSCOPY TIME:  1 minutes 50 seconds fluoro time reported. Please see operative note for complete detail  COMPARISON:  Abdominal MRI 07/13/2014 ; nuclear medicine HIDA scan 07/16/2014  FINDINGS: A total of 6 intraoperative spot images demonstrate cannulation of the common bile duct and placement of a plastic biliary stent with decompression of the biliary tree. There appears to be some extravasation of contrast material possibly from the cystic duct remanent.  IMPRESSION: ERCP and placement of a plastic biliary stent.  There appears to be some extravasation of contrast material, possibly from the cystic duct remnant.  These images were submitted for radiologic interpretation only. Please see the procedural report for the amount of contrast and the fluoroscopy time utilized.   Electronically Signed   By: Jacqulynn Cadet M.D.   On: 07/16/2014 19:54   ASSESSMENT AND PLAN:  49 y.o. female who presents with right upper quadrant and epigastric abdominal pain with nausea and vomiting. Patient states that she's been dealing with these symptoms intermittently for the past couple months, and has had workup for the same.  * Right UQ pain with intractable nausea and vomitng and abnormal USG abdomen with possible Choledocholithiasis  -ultrasound imaging showing common bile duct dilatation and biliary sludge.  - pain control and antiemetics for nausea and vomiting control.  -negative MRCP - -pt is s/p Lap Chole July 13th by dr Marina Gravel -now has positive biliary leak on Hida scan noted on July 13th HIDA scan. . S/p ERCP with stent  placement at [facility] on 7/13 -Incentive spirometer -FLD  *  Type 2 diabetes mellitus - sliding scale insulin with fingerstick glucose checks every 6 hours for now   * COPD (chronic obstructive pulmonary disease) - continue home inhalers.  * HTN (hypertension) - controlled, continue home meds.  * OSA on CPAP - CPAP daily at bedtime   * GERD (gastroesophageal reflux disease) - equivalent home dose PPI  * Anxiety - home medications  * Thrombocytopenia: Hold off antiplt agents or lovenox, would do SCD-TEDs  Management plans discussed with the patient, family and they are in agreement.  CODE STATUS: full  DVT Prophylaxis: TEDs and SCD due to low plt  TOTAL TIME TAKING CARE OF THIS PATIENT: 35 minutes.  >50% time spent on counselling and coordination of care  Malyna Budney M.D on 07/17/2014 at 8:13 AM  Between 7am to 6pm - Pager - [phone]  After 6pm go to [email or web address] - password [email or web address] [place] [facility]  Office  [phone]  CC: Primary care physician; Ashkin, Neldon Labella, MD

## 2014-07-17 NOTE — Progress Notes (Signed)
Initial Nutrition Assessment       INTERVENTION:   Nutrition Supplement Therapy: Recommend boost breeze (clear liquid supplement) TID. Boost Breeze po TID, each supplement provides 250 kcal and 9 grams of protein   NUTRITION DIAGNOSIS:   Inadequate oral intake related to altered GI function as evidenced by meal completion < 50%.    GOAL:   Patient will meet greater than or equal to 90% of their needs    MONITOR:    (Energy intake, Digestive system, Anthropometric)  REASON FOR ASSESSMENT:   Malnutrition Screening Tool    ASSESSMENT:      Pt s/p lap cholecystectomy on 7/13, then transfer to [facility] for ERCP and stent placement on 7/13 secondary to post op bile leak  (new admission)    PMHx:  Past Medical History  Diagnosis Date  . Brittle bone disease   . Asthma   . Thyroid disease   . Hypertension   . Back pain   . Diabetes mellitus without complication   . Chronic kidney disease   . Anxiety   . GERD (gastroesophageal reflux disease)   . Sleep apnea   . COPD (chronic obstructive pulmonary disease)   . Hypothyroidism   . Cervical disc disease   . TIA (transient ischemic attack)     Diet Order: clear liquids   Current Nutrition: sipping liquids (NPO/CL day 4)  Food/Nutrition-Related History: reports eating 1 meal or less for the last 3 months secondary to abdominal pain, diarrhea   Medications: lasix, aspart, D5 1/2 NS with KCL at 15m/hr  Electrolyte/Renal Profile and Glucose Profile:   Recent Labs Lab 07/13/14 0443 07/16/14 0350 07/17/14 0144  NA 138 140 139  K 3.8 3.8 3.4*  CL 108 109 100*  CO2 25 27 31   BUN 12 9 10   CREATININE 0.70 0.79 0.70  CALCIUM 8.4* 8.8* 8.7*  GLUCOSE 82 174* 123*   Protein Profile:  Recent Labs Lab 07/13/14 1635 07/16/14 0350 07/17/14 0144  ALBUMIN 4.4 4.1 3.5    Gastrointestinal Profile: abdominal pain, nausea relieved with nausea Last BM: 7/10   Nutrition-Focused Physical Exam Findings:  Nutrition-Focused physical exam completed. Findings are WDL for fat depletion, muscle depletion, and except mild edema.     Weight Change: pt reports weight loss of 52 pounds in the last 3 months.  Noted per weight encounters weight gain since 2013.  Anthropometrics:    Skin:  Reviewed, no issues   Height:   Ht Readings from Last 1 Encounters:  07/12/14 5' 2"  (1.575 m)    Weight:   Wt Readings from Last 1 Encounters:  07/12/14 250 lb (113.399 kg)    Ideal Body Weight:  50 kg  Wt Readings from Last 10 Encounters:  07/12/14 250 lb (113.399 kg)  06/23/14 248 lb (112.492 kg)  03/17/11 228 lb (103.42 kg)  01/07/11 243 lb (110.224 kg)    BMI:  There is no weight on file to calculate BMI.  Estimated Nutritional Needs:   Kcal:  Using IBW of 50kg (BEE 1068 kcals (IF 1.0-1.3, AF 1.3) 12595-6387kcals/d  Protein:  Using IBW of 50kg (1.0-1.2 g/kg) 50-60 g/d  Fluid:  Using IBW of 50kg (25-335mkg) 1250-150056m EDUCATION NEEDS:   No education needs identified at this time  MODERATE Care Level Kelsea Mousel B. AllZenia ResidesD,[place]ager)

## 2014-07-17 NOTE — Progress Notes (Signed)
Subjective:  He returned last evening from [facility] following her ERCP procedure. By reading the report she had a successful stent placement. Bile duct leak did appear to be from the cystic duct remnant. She's had significant drainage around the JP drain and only minimal drainage in the drain. Blood pressures remain low over the course of the evening. I'm not certain whether that hypotension is related to her anesthesia or to her volume status. We've not seen any urine output since she's been back in the hospital.  She continues to complain of pain in her right upper quadrant. She's not nauseated.  Vital signs in last 24 hours: Temp:  [97.6 F (36.4 C)-99.5 F (37.5 C)] 99 F (37.2 C) (07/14 0747) Pulse Rate:  [66-96] 81 (07/14 0747) Resp:  [15-23] 19 (07/14 0315) BP: (77-166)/(37-100) 124/59 mmHg (07/14 0747) SpO2:  [84 %-100 %] 95 % (07/14 0747)    Intake/Output from previous day: 07/13 0701 - 07/14 0700 In: 460 [P.O.:120; I.V.:340] Out: 70 [Drains:70]  GI: Her abdomen is soft with some moderate right upper quadrant tenderness. She has hypoactive but present bowel sounds.   Her lungs are clear. She is a bit flushed and slightly lethargic.  Lab Results:  CBC  Recent Labs  07/16/14 0350 07/17/14 0144  WBC 11.4* 9.2  HGB 12.7 11.9*  HCT 38.1 35.1  PLT 130* 133*   CMP     Component Value Date/Time   NA 139 07/17/2014 0144   NA 142 03/27/2014 1042   K 3.4* 07/17/2014 0144   K 3.5 03/27/2014 1042   CL 100* 07/17/2014 0144   CL 101 03/27/2014 1042   CO2 31 07/17/2014 0144   CO2 27 03/27/2014 1042   GLUCOSE 123* 07/17/2014 0144   GLUCOSE 119* 03/27/2014 1042   BUN 10 07/17/2014 0144   BUN 16 03/27/2014 1042   CREATININE 0.70 07/17/2014 0144   CREATININE 0.73 03/27/2014 1042   CALCIUM 8.7* 07/17/2014 0144   CALCIUM 10.4* 03/27/2014 1042   PROT 6.6 07/17/2014 0144   PROT 9.4* 03/27/2014 1042   ALBUMIN 3.5 07/17/2014 0144   ALBUMIN 5.3* 03/27/2014  1042   AST 30 07/17/2014 0144   AST 81* 03/27/2014 1042   ALT 25 07/17/2014 0144   ALT 58* 03/27/2014 1042   ALKPHOS 39 07/17/2014 0144   ALKPHOS 63 03/27/2014 1042   BILITOT 1.3* 07/17/2014 0144   GFRNONAA >60 07/17/2014 0144   GFRNONAA >60 03/27/2014 1042   GFRAA >60 07/17/2014 0144   GFRAA >60 03/27/2014 1042   PT/INR No results for input(s): LABPROT, INR in the last 72 hours.  Studies/Results: Nm Hepatobiliary Liver Func  07/16/2014   CLINICAL DATA:  Right upper quadrant abdominal pain with drainage from abdominal wounds status post cholecystectomy 2 days ago. Evaluate for bile leak.  EXAM: NUCLEAR MEDICINE HEPATOBILIARY IMAGING  TECHNIQUE: Sequential images of the abdomen were obtained out to 60 minutes following intravenous administration of radiopharmaceutical.  RADIOPHARMACEUTICALS:  5.45 mCi Tc-21m Choletec IV  COMPARISON:  Preoperative MRI 07/13/2014.  FINDINGS: Initial images demonstrate homogeneous hepatic activity. There is prompt opacification of the biliary system and small bowel. There is progressive accumulation of activity within the cholecystectomy bed with extension into the subhepatic space consistent with a bile leak. Some of this activity may be within a surgical drain if present. There is also accumulation of activity superior to the right hepatic lobe.  IMPRESSION: Findings are consistent with a bile leak with accumulation of activity in the  cholecystectomy bed, subhepatic space and superior to the right hepatic lobe. ERCP for biliary stenting recommended.  These results will be called to the ordering clinician or representative by the Radiologist Assistant, and communication documented in the PACS or zVision Dashboard.   Electronically Signed   By: Richardean Sale M.D.   On: 07/16/2014 10:37   Dg Ercp Biliary & Pancreatic Ducts  07/16/2014   CLINICAL DATA:  49 year old female with biliary leak status post cholecystectomy  EXAM: ERCP and stent placement  TECHNIQUE:  Multiple spot images obtained with the fluoroscopic device and submitted for interpretation post-procedure.  FLUOROSCOPY TIME:  1 minutes 50 seconds fluoro time reported. Please see operative note for complete detail  COMPARISON:  Abdominal MRI 07/13/2014 ; nuclear medicine HIDA scan 07/16/2014  FINDINGS: A total of 6 intraoperative spot images demonstrate cannulation of the common bile duct and placement of a plastic biliary stent with decompression of the biliary tree. There appears to be some extravasation of contrast material possibly from the cystic duct remanent.  IMPRESSION: ERCP and placement of a plastic biliary stent.  There appears to be some extravasation of contrast material, possibly from the cystic duct remnant.  These images were submitted for radiologic interpretation only. Please see the procedural report for the amount of contrast and the fluoroscopy time utilized.   Electronically Signed   By: Jacqulynn Cadet M.D.   On: 07/16/2014 19:54    Assessment/Plan: Blood pressures a bit better this morning. We will give her some increased fluid volume. Her hemoglobin is stable. There is no evidence of any postoperative complication from the ERCP. We will recheck her liver function studies. We'll restart her pain medication.  We appreciate the involvement of the [facility] group in the treatment of this patient.

## 2014-07-17 NOTE — Evaluation (Signed)
Physical Therapy Evaluation Patient Details Name: Lori Mckenzie MRN: [id number] DOB: 07-23-1965 Today's Date: 07/17/2014   History of Present Illness  49 yo female with onset of abd pain with bile leak from cannula in bile duct, stented on 07/16/14 and now PT assessing for home.  PMHx:  TIA COPD, back pain  Clinical Impression  Pt was able to walk with no supplemental O2 with sat 98 to 90% but declined to 89% before recovery to baseline.  Planning to leave possibly today and recommend HHPT and follow up therapy.    Follow Up Recommendations Home health PT;Supervision - Intermittent    Equipment Recommendations  Rolling walker with 5" wheels (if pt does not have one)    Recommendations for Other Services       Precautions / Restrictions Precautions Precautions: Fall Restrictions Weight Bearing Restrictions: No      Mobility  Bed Mobility Overal bed mobility: Needs Assistance Bed Mobility: Supine to Sit;Sit to Supine     Supine to sit: Min assist Sit to supine: Min assist   General bed mobility comments: used PT help for sitting up under trunk and assisted LE's with sit to supine  Transfers Overall transfer level: Modified independent Equipment used:  (IV pole)             General transfer comment: reminders for hamd placment  Ambulation/Gait Ambulation/Gait assistance: Min guard Ambulation Distance (Feet): 150 Feet Assistive device:  (IV pole) Gait Pattern/deviations: Step-through pattern;Decreased stride length;Wide base of support;Trunk flexed;Shuffle Gait velocity: slow Gait velocity interpretation: Below normal speed for age/gender    Stairs            Wheelchair Mobility    Modified Rankin (Stroke Patients Only)       Balance Overall balance assessment: Needs assistance Sitting-balance support: Feet supported Sitting balance-Leahy Scale: Fair   Postural control: Posterior lean Standing balance support: Bilateral upper extremity  supported Standing balance-Leahy Scale: Poor Standing balance comment: uses HHA on furniture or IV pole                             Pertinent Vitals/Pain Pain Assessment: No/denies pain    Home Living Family/patient expects to be discharged to:: Private residence Living Arrangements: Other (Comment) (Not sure per pt) Available Help at Discharge: Family;Available PRN/intermittently Type of Home: House         Home Equipment: Walker - 2 wheels;Cane - single point Additional Comments: Displaced now and items in boxes, not sure of her destination    Prior Function Level of Independence: Independent with assistive device(s)         Comments: used O2 with sporadic need, 1.5 to 2 L per min     Hand Dominance        Extremity/Trunk Assessment   Upper Extremity Assessment: Overall WFL for tasks assessed           Lower Extremity Assessment: Generalized weakness      Cervical / Trunk Assessment: Normal  Communication   Communication: No difficulties  Cognition Arousal/Alertness: Awake/alert Behavior During Therapy: WFL for tasks assessed/performed Overall Cognitive Status: Within Functional Limits for tasks assessed                      General Comments General comments (skin integrity, edema, etc.): O2 was controlled for gait with 98% initially and after walk was 89% then back to 98%.  Room air for all mobiltiy  Exercises        Assessment/Plan    PT Assessment Patient needs continued PT services  PT Diagnosis Generalized weakness   PT Problem List Decreased strength;Decreased range of motion;Decreased activity tolerance;Decreased balance;Decreased mobility;Decreased coordination;Cardiopulmonary status limiting activity;Obesity;Decreased skin integrity  PT Treatment Interventions DME instruction;Gait training;Functional mobility training;Therapeutic activities;Therapeutic exercise;Neuromuscular re-education;Balance  training;Patient/family education   PT Goals (Current goals can be found in the Care Plan section) Acute Rehab PT Goals Patient Stated Goal: to have a place to go home PT Goal Formulation: With patient/family Time For Goal Achievement: 07/31/14 Potential to Achieve Goals: Good    Frequency Min 2X/week   Barriers to discharge Other (comment) (no details for home due to her situation)      Co-evaluation               End of Session Equipment Utilized During Treatment: Gait belt Activity Tolerance: Patient tolerated treatment well Patient left: in bed;with call bell/phone within reach;with family/visitor present Nurse Communication: Mobility status         Time: 4730-8569 PT Time Calculation (min) (ACUTE ONLY): 29 min   Charges:   PT Evaluation $Initial PT Evaluation Tier I: 1 Procedure PT Treatments $Gait Training: 8-22 mins   PT G CodesRamond Dial 2014-08-11, 1:02 PM   Mee Hives, PT MS Acute Rehab Dept. Number: [phone] [id number] and [place] [phone]

## 2014-07-18 ENCOUNTER — Inpatient Hospital Stay: Payer: Medicaid Other

## 2014-07-18 LAB — GLUCOSE, CAPILLARY
Glucose-Capillary: 109 mg/dL — ABNORMAL HIGH (ref 65–99)
Glucose-Capillary: 136 mg/dL — ABNORMAL HIGH (ref 65–99)
Glucose-Capillary: 162 mg/dL — ABNORMAL HIGH (ref 65–99)
Glucose-Capillary: 131 mg/dL — ABNORMAL HIGH (ref 65–99)

## 2014-07-18 LAB — BASIC METABOLIC PANEL
Anion gap: 7 (ref 5–15)
BUN: 5 mg/dL — ABNORMAL LOW (ref 6–20)
CO2: 31 mmol/L (ref 22–32)
Calcium: 8.1 mg/dL — ABNORMAL LOW (ref 8.9–10.3)
Chloride: 102 mmol/L (ref 101–111)
Creatinine, Ser: 0.66 mg/dL (ref 0.44–1.00)
GFR calc Af Amer: >60 mL/min (ref >60)
GFR calc non Af Amer: >60 mL/min (ref >60)
Glucose, Bld: 115 mg/dL — ABNORMAL HIGH (ref 65–99)
Potassium: 3.3 mmol/L — ABNORMAL LOW (ref 3.5–5.1)
Sodium: 140 mmol/L (ref 135–145)

## 2014-07-18 LAB — HEPATIC FUNCTION PANEL
ALT: 17 U/L (ref 14–54)
AST: 16 U/L (ref 15–41)
Albumin: 3.2 g/dL — ABNORMAL LOW (ref 3.5–5.0)
Alkaline Phosphatase: 45 U/L (ref 38–126)
Bilirubin, Direct: 0.4 mg/dL (ref 0.1–0.5)
Indirect Bilirubin: 0.8 mg/dL (ref 0.3–0.9)
Total Bilirubin: 1.2 mg/dL (ref 0.3–1.2)
Total Protein: 6.2 g/dL — ABNORMAL LOW (ref 6.5–8.1)

## 2014-07-18 LAB — CBC
HCT: 32.5 % — ABNORMAL LOW (ref 35.0–47.0)
Hemoglobin: 11.1 g/dL — ABNORMAL LOW (ref 12.0–16.0)
MCH: 32.7 pg (ref 26.0–34.0)
MCHC: 34.1 g/dL (ref 32.0–36.0)
MCV: 96 fL (ref 80.0–100.0)
Platelets: 102 10*3/uL — ABNORMAL LOW (ref 150–440)
RBC: 3.39 MIL/uL — ABNORMAL LOW (ref 3.80–5.20)
RDW: 13.7 % (ref 11.5–14.5)
WBC: 6.1 10*3/uL (ref 3.6–11.0)

## 2014-07-18 MED ORDER — ASPIRIN 81 MG PO CHEW
81.0000 mg | CHEWABLE_TABLET | Freq: Once | ORAL | Status: AC
  Administered 2014-07-18: 81 mg via ORAL
  Filled 2014-07-18: qty 1

## 2014-07-18 MED ORDER — ONDANSETRON HCL 4 MG PO TABS
4.0000 mg | ORAL_TABLET | ORAL | Status: DC | PRN
  Administered 2014-07-18 – 2014-07-30 (×8): 4 mg via ORAL
  Filled 2014-07-18 (×8): qty 1

## 2014-07-18 MED ORDER — OXYCODONE-ACETAMINOPHEN 5-325 MG PO TABS
1.0000 | ORAL_TABLET | ORAL | Status: DC | PRN
  Administered 2014-07-18 (×2): 2 via ORAL
  Filled 2014-07-18 (×2): qty 2

## 2014-07-18 MED ORDER — SODIUM CHLORIDE 0.9 % IV SOLN
INTRAVENOUS | Status: DC
  Administered 2014-07-18: 12:00:00 via INTRAVENOUS

## 2014-07-18 MED ORDER — MENTHOL 3 MG MT LOZG
1.0000 | LOZENGE | OROMUCOSAL | Status: DC | PRN
  Administered 2014-07-18: 3 mg via ORAL

## 2014-07-18 MED ORDER — ONDANSETRON HCL 4 MG PO TABS: 4.0000 mg | ORAL_TABLET | Freq: Four times a day (QID) | ORAL | Status: DC | PRN

## 2014-07-18 NOTE — Progress Notes (Signed)
MD notified of no iv access, MD is okay with leaving access out for tonight since pt is tolerating POs. D/c iv dilaudid and use prn percocets for pain management. Changed iv zofran to po. D/c iv fluids. Will continue to monitor.

## 2014-07-18 NOTE — Progress Notes (Signed)
Dr. Marcille Blanco notified of fine crackles auscultated in bilateral upper, anterior lung fields; discussed lung assessment, IVF rate and VS; ordered chest xray; Barbaraann Faster, RN; 5:11 AM; [phone]

## 2014-07-18 NOTE — Progress Notes (Signed)
PT Cancellation Note  Patient Details Name: Lori Mckenzie MRN: [id number] DOB: 1965-06-09   Cancelled Treatment:   Pt is distraught about not having a home to return to.  Husband apparently is very limited functionally and can't help much.  Pt refused to do any PT this AM, bit states that she did a good walking in the hall yesterday.    Reason Eval/Treat Not Completed: Other (comment) (Going through a lot, not interested in working with PT now)   Kreg Shropshire 07/18/2014, 3:06 PM

## 2014-07-18 NOTE — Progress Notes (Signed)
Notified MD of request for throat spray and headache meds (maxsalt). Orders received for cephacol lozenges and pt may take max salt. Pharmacy does not carry, will request for family to bring home med.

## 2014-07-18 NOTE — Progress Notes (Signed)
Page Dr. Posey Pronto regarding chest pain reported by the pt. Dr. Posey Pronto gave orders for 81 mg of Aspirin and a STAT EKG.

## 2014-07-18 NOTE — Progress Notes (Signed)
[facility] [place] NAME: Lori Mckenzie    MR#:  [id number]  DATE OF BIRTH:  October 05, 1965  SUBJECTIVE:  Ongoing abdominal pain. Tolerating CLD. Wants more food to try  REVIEW OF SYSTEMS:   Review of Systems  Constitutional: Negative for fever, chills and weight loss.  HENT: Negative for ear discharge, ear pain and nosebleeds.   Eyes: Negative for blurred vision, pain and discharge.  Respiratory: Negative for sputum production, shortness of breath, wheezing and stridor.   Cardiovascular: Negative for chest pain, palpitations, orthopnea and PND.  Gastrointestinal: Positive for nausea and abdominal pain. Negative for vomiting and diarrhea.  Genitourinary: Negative for urgency and frequency.  Musculoskeletal: Negative for back pain and joint pain.  Neurological: Negative for sensory change, speech change, focal weakness and weakness.  Psychiatric/Behavioral: Negative for depression and hallucinations. The patient is nervous/anxious.   All other systems reviewed and are negative.  Tolerating Diet: on liquids  DRUG ALLERGIES:   Allergies  Allergen Reactions  . Hydrocodone Itching    Other reaction(s): ITCHING  . Vicodin [Hydrocodone-Acetaminophen] Rash    VITALS:  Blood pressure 98/56, pulse 71, temperature 98.2 F (36.8 C), temperature source Oral, resp. rate 17, height 5' 2"  (1.575 m), weight 110.859 kg (244 lb 6.4 oz), SpO2 96 %.  PHYSICAL EXAMINATION:   Physical Exam  Constitutional: She is oriented to person, place, and time and well-developed, well-nourished, and in no distress.  HENT:  Head: Normocephalic and atraumatic.  Eyes: Conjunctivae and EOM are normal. Pupils are equal, round, and reactive to light.  Neck: Normal range of motion. Neck supple. No tracheal deviation present. No thyromegaly present.  Cardiovascular: Normal rate, regular rhythm and normal heart sounds.   Pulmonary/Chest: Effort normal and breath sounds  normal. No respiratory distress. She has no wheezes. She exhibits no tenderness.  Abdominal: Soft. Bowel sounds are normal. She exhibits no distension. There is tenderness (right upper quadrant).  Drain +  Musculoskeletal: Normal range of motion.  Neurological: She is alert and oriented to person, place, and time. No cranial nerve deficit.  Skin: Skin is warm and dry. No rash noted.  Psychiatric: Mood and affect normal.   LABORATORY PANEL:   CBC  Recent Labs Lab 07/18/14 0421  WBC 6.1  HGB 11.1*  HCT 32.5*  PLT 102*    Chemistries   Recent Labs Lab 07/18/14 0421  NA 140  K 3.3*  CL 102  CO2 31  GLUCOSE 115*  BUN 5*  CREATININE 0.66  CALCIUM 8.1*  AST 16  ALT 17  ALKPHOS 45  BILITOT 1.2   RADIOLOGY:  Dg Chest 1 View  07/18/2014   CLINICAL DATA:  Asthma with rales  EXAM: CHEST  1 VIEW  COMPARISON:  November 15, 2013  FINDINGS: There is no edema or consolidation. Heart is upper normal in size with pulmonary vascularity within normal limits. No adenopathy. No bone lesions.  IMPRESSION: No edema or consolidation.   Electronically Signed   By: Lowella Grip III M.D.   On: 07/18/2014 07:19   Dg Ercp Biliary & Pancreatic Ducts  07/16/2014   CLINICAL DATA:  50 year old female with biliary leak status post cholecystectomy  EXAM: ERCP and stent placement  TECHNIQUE: Multiple spot images obtained with the fluoroscopic device and submitted for interpretation post-procedure.  FLUOROSCOPY TIME:  1 minutes 50 seconds fluoro time reported. Please see operative note for complete detail  COMPARISON:  Abdominal MRI 07/13/2014 ; nuclear medicine HIDA scan  07/16/2014  FINDINGS: A total of 6 intraoperative spot images demonstrate cannulation of the common bile duct and placement of a plastic biliary stent with decompression of the biliary tree. There appears to be some extravasation of contrast material possibly from the cystic duct remanent.  IMPRESSION: ERCP and placement of a plastic  biliary stent.  There appears to be some extravasation of contrast material, possibly from the cystic duct remnant.  These images were submitted for radiologic interpretation only. Please see the procedural report for the amount of contrast and the fluoroscopy time utilized.   Electronically Signed   By: Jacqulynn Cadet M.D.   On: 07/16/2014 19:54   ASSESSMENT AND PLAN:  49 y.o. female who presents with right upper quadrant and epigastric abdominal pain with nausea and vomiting. Patient states that she's been dealing with these symptoms intermittently for the past couple months, and has had workup for the same.  * Right UQ pain with intractable nausea and vomitng and abnormal USG abdomen with possible Choledocholithiasis  -ultrasound imaging showing common bile duct dilatation and biliary sludge.  - pain control and antiemetics for nausea and vomiting control.  -pt is s/p Lap Chole July 13th by dr Marina Gravel -now has positive biliary leak on Hida scan noted on July 13th HIDA scan. . S/p ERCP with stent placement at [place] on 7/13 -Incentive spirometer -FLD  *  Type 2 diabetes mellitus - sliding scale insulin with fingerstick glucose checks every 6 hours for now  Resume home meds once po intake improves  * COPD (chronic obstructive pulmonary disease) - continue home inhalers.  * HTN (hypertension) - controlled, continue home meds.  * OSA on CPAP - CPAP daily at bedtime   * GERD (gastroesophageal reflux disease) - equivalent home dose PPI  * Anxiety - home medications  * Thrombocytopenia: Hold off antiplt agents or lovenox, would do SCD-TEDs  D/w Dr Audelia Hives to surgical service.  Management plans discussed with the patient, family and they are in agreement.  CODE STATUS: full  DVT Prophylaxis: TEDs and SCD due to low plt  TOTAL TIME TAKING CARE OF THIS PATIENT: 35 minutes.  >50% time spent on counselling and coordination of care  Jaideep Pollack M.D on 07/18/2014 at 1:19  PM  Between 7am to 6pm - Pager - [phone]  After 6pm go to [email or web address] - password [email or web address] [facility]  Office  [phone]  CC: Primary care physician; Ashkin, Neldon Labella, MD

## 2014-07-19 ENCOUNTER — Inpatient Hospital Stay: Payer: Medicaid Other

## 2014-07-19 LAB — GLUCOSE, CAPILLARY
Glucose-Capillary: 125 mg/dL — ABNORMAL HIGH (ref 65–99)
Glucose-Capillary: 88 mg/dL (ref 65–99)
Glucose-Capillary: 109 mg/dL — ABNORMAL HIGH (ref 65–99)
Glucose-Capillary: 122 mg/dL — ABNORMAL HIGH (ref 65–99)

## 2014-07-19 LAB — MAGNESIUM: Magnesium: 1.6 mg/dL — ABNORMAL LOW (ref 1.7–2.4)

## 2014-07-19 LAB — BASIC METABOLIC PANEL
Anion gap: 7 (ref 5–15)
BUN: <5 mg/dL — ABNORMAL LOW (ref 6–20)
CO2: 28 mmol/L (ref 22–32)
Calcium: 7.9 mg/dL — ABNORMAL LOW (ref 8.9–10.3)
Chloride: 104 mmol/L (ref 101–111)
Creatinine, Ser: 0.57 mg/dL (ref 0.44–1.00)
GFR calc Af Amer: >60 mL/min (ref >60)
GFR calc non Af Amer: >60 mL/min (ref >60)
Glucose, Bld: 118 mg/dL — ABNORMAL HIGH (ref 65–99)
Potassium: 4 mmol/L (ref 3.5–5.1)
Sodium: 139 mmol/L (ref 135–145)

## 2014-07-19 MED ORDER — MAGNESIUM OXIDE 400 (241.3 MG) MG PO TABS
800.0000 mg | ORAL_TABLET | Freq: Once | ORAL | Status: AC
  Administered 2014-07-19: 800 mg via ORAL
  Filled 2014-07-19: qty 2

## 2014-07-19 MED ORDER — OXYCODONE HCL 5 MG PO TABS
5.0000 mg | ORAL_TABLET | ORAL | Status: DC | PRN
  Administered 2014-07-19: 10 mg via ORAL
  Filled 2014-07-19: qty 2

## 2014-07-19 MED ORDER — DIAZEPAM 2 MG PO TABS
2.0000 mg | ORAL_TABLET | Freq: Four times a day (QID) | ORAL | Status: DC | PRN
  Administered 2014-07-19: 4 mg via ORAL
  Administered 2014-07-19: 2 mg via ORAL
  Administered 2014-07-19 – 2014-07-21 (×5): 4 mg via ORAL
  Administered 2014-07-21: 2 mg via ORAL
  Administered 2014-07-21 – 2014-07-22 (×2): 4 mg via ORAL
  Filled 2014-07-19 (×2): qty 2
  Filled 2014-07-19: qty 1
  Filled 2014-07-19: qty 2
  Filled 2014-07-19: qty 1
  Filled 2014-07-19 (×5): qty 2

## 2014-07-19 MED ORDER — SODIUM CHLORIDE 0.9 % IV BOLUS (SEPSIS)
1000.0000 mL | Freq: Once | INTRAVENOUS | Status: AC
  Administered 2014-07-19: 1000 mL via INTRAVENOUS

## 2014-07-19 MED ORDER — ACETAMINOPHEN 500 MG PO TABS: 1000.0000 mg | ORAL_TABLET | Freq: Three times a day (TID) | ORAL | Status: DC

## 2014-07-19 MED ORDER — OXYCODONE-ACETAMINOPHEN 5-325 MG PO TABS
1.0000 | ORAL_TABLET | ORAL | Status: DC | PRN
  Administered 2014-07-19 (×3): 2 via ORAL
  Administered 2014-07-19: 1 via ORAL
  Administered 2014-07-20 – 2014-07-21 (×8): 2 via ORAL
  Administered 2014-07-21: 1 via ORAL
  Administered 2014-07-22 (×5): 2 via ORAL
  Administered 2014-07-23 (×2): 1 via ORAL
  Administered 2014-07-24 – 2014-07-27 (×11): 2 via ORAL
  Administered 2014-07-28 (×3): 1 via ORAL
  Filled 2014-07-19 (×10): qty 2
  Filled 2014-07-19: qty 1
  Filled 2014-07-19: qty 2
  Filled 2014-07-19: qty 1
  Filled 2014-07-19 (×11): qty 2
  Filled 2014-07-19: qty 1
  Filled 2014-07-19 (×13): qty 2

## 2014-07-19 MED ORDER — OXYCODONE HCL 5 MG PO TABS
5.0000 mg | ORAL_TABLET | ORAL | Status: DC | PRN
  Administered 2014-07-19 – 2014-07-28 (×23): 5 mg via ORAL
  Filled 2014-07-19 (×24): qty 1

## 2014-07-19 NOTE — Progress Notes (Signed)
MD notified of pt low bp, received orders per dr hower to give 1L NS bolus. BP improved, Will continue to monitor. IV access achieved successfully.

## 2014-07-19 NOTE — Progress Notes (Signed)
Progress note  S: Continues to c/o pain, swelling LUQ, low bp overnight but alert and oriented with good UOP O Blood pressure 95/49, pulse 58, temperature 98.9 F (37.2 C), temperature source Oral, resp. rate 18, height 5' 2"  (1.575 m), weight 110.859 kg (244 lb 6.4 oz), SpO2 98 %. GEN: NAD/A&Ox3 EXT: mildly swollen, nonerythemic LUE ABD: soft, mild tender, nondistended, JP with minimal bilious  A/P 49 yo s/p lap chole with biliary leak, stent - oxycodone, tylenol - valium for spasms - LUE ultrasound - ambulate

## 2014-07-19 NOTE — Progress Notes (Signed)
Pt doesn't have home CPAP here at the hospital. Offered to set pt up on a hospital CPAP unit but she refused. She states that she doesn't really use hers at home anyway. I encouraged pt to call if she changes her mind about the hospital issued CPAP.

## 2014-07-19 NOTE — Progress Notes (Signed)
[facility] [place] NAME: Lori Mckenzie    MR#:  [id number]  DATE OF BIRTH:  01-07-1965  SUBJECTIVE:   abdominal pain and chronic diarrhea  REVIEW OF SYSTEMS:   Review of Systems  Constitutional: Negative for fever, chills and weight loss.  HENT: Negative for ear discharge, ear pain and nosebleeds.   Eyes: Negative for blurred vision, pain and discharge.  Respiratory: Negative for sputum production, shortness of breath, wheezing and stridor.   Cardiovascular: Negative for chest pain, palpitations, orthopnea and PND.  Gastrointestinal: Positive for abdominal pain and diarrhea. Negative for vomiting.  Genitourinary: Negative for urgency and frequency.  Musculoskeletal: Negative for back pain and joint pain.  Neurological: Negative for sensory change, speech change, focal weakness and weakness.  Psychiatric/Behavioral: Negative for depression and hallucinations.  All other systems reviewed and are negative.   DRUG ALLERGIES:   Allergies  Allergen Reactions  . Hydrocodone Itching    Other reaction(s): ITCHING  . Vicodin [Hydrocodone-Acetaminophen] Rash    VITALS:  Blood pressure 95/49, pulse 58, temperature 98.9 F (37.2 C), temperature source Oral, resp. rate 18, height 5' 2"  (1.575 m), weight 110.859 kg (244 lb 6.4 oz), SpO2 98 %.  PHYSICAL EXAMINATION:   Physical Exam  Constitutional: She is oriented to person, place, and time and well-developed, well-nourished, and in no distress.  HENT:  Head: Normocephalic and atraumatic.  Eyes: Conjunctivae and EOM are normal. Pupils are equal, round, and reactive to light.  Neck: Normal range of motion. Neck supple. No tracheal deviation present. No thyromegaly present.  Cardiovascular: Normal rate, regular rhythm and normal heart sounds.   Pulmonary/Chest: Effort normal and breath sounds normal. No respiratory distress. She has no wheezes. She exhibits no tenderness.  Abdominal: Soft.  Bowel sounds are normal. She exhibits no distension. There is tenderness (right upper quadrant).  Drain +  Musculoskeletal: Normal range of motion.  Neurological: She is alert and oriented to person, place, and time. No cranial nerve deficit.  Skin: Skin is warm and dry. No rash noted.  Psychiatric: Mood and affect normal.   LABORATORY PANEL:   CBC  Recent Labs Lab 07/18/14 0421  WBC 6.1  HGB 11.1*  HCT 32.5*  PLT 102*    Chemistries   Recent Labs Lab 07/18/14 0421 07/19/14 0809  NA 140 139  K 3.3* 4.0  CL 102 104  CO2 31 28  GLUCOSE 115* 118*  BUN 5* <5*  CREATININE 0.66 0.57  CALCIUM 8.1* 7.9*  MG  --  1.6*  AST 16  --   ALT 17  --   ALKPHOS 45  --   BILITOT 1.2  --    RADIOLOGY:  Dg Chest 1 View  07/18/2014   CLINICAL DATA:  Asthma with rales  EXAM: CHEST  1 VIEW  COMPARISON:  November 15, 2013  FINDINGS: There is no edema or consolidation. Heart is upper normal in size with pulmonary vascularity within normal limits. No adenopathy. No bone lesions.  IMPRESSION: No edema or consolidation.   Electronically Signed   By: Lowella Grip III M.D.   On: 07/18/2014 07:19   US Venous Img Upper Uni Left  07/19/2014   CLINICAL DATA:  49 year old female with left upper extremity swelling  EXAM: LEFT UPPER EXTREMITY VENOUS DOPPLER ULTRASOUND  TECHNIQUE: Gray-scale sonography with graded compression, as well as color Doppler and duplex ultrasound were performed to evaluate the upper extremity deep venous system from the level of the  subclavian vein and including the jugular, axillary, basilic, radial, ulnar and upper cephalic vein. Spectral Doppler was utilized to evaluate flow at rest and with distal augmentation maneuvers.  COMPARISON:  None.  FINDINGS: Contralateral Subclavian Vein: Respiratory phasicity is normal and symmetric with the symptomatic side. No evidence of thrombus. Normal compressibility.  Internal Jugular Vein: No evidence of thrombus. Normal compressibility,  respiratory phasicity and response to augmentation.  Subclavian Vein: No evidence of thrombus. Normal compressibility, respiratory phasicity and response to augmentation.  Axillary Vein: No evidence of thrombus. Normal compressibility, respiratory phasicity and response to augmentation.  Cephalic Vein: No evidence of thrombus. Normal compressibility, respiratory phasicity and response to augmentation.  Basilic Vein: No evidence of thrombus. Normal compressibility, respiratory phasicity and response to augmentation.  Brachial Veins: No evidence of thrombus. Normal compressibility, respiratory phasicity and response to augmentation.  Radial Veins: No evidence of thrombus. Normal compressibility, respiratory phasicity and response to augmentation.  Ulnar Veins: No evidence of thrombus. Normal compressibility, respiratory phasicity and response to augmentation.  Venous Reflux:  None visualized.  Other Findings:  None visualized.  IMPRESSION: No evidence of deep venous thrombosis.   Electronically Signed   By: Jacqulynn Cadet M.D.   On: 07/19/2014 12:16   ASSESSMENT AND PLAN:  49 y.o. female who presents with right upper quadrant and epigastric abdominal pain with nausea and vomiting. Patient states that she's been dealing with these symptoms intermittently for the past couple months, and has had workup for the same.  * Right UQ pain with intractable nausea and vomitng and abnormal USG abdomen with possible Choledocholithiasis  -ultrasound imaging showing common bile duct dilatation and biliary sludge.  - pain control and antiemetics for nausea and vomiting control.  -pt is s/p Lap Chole July 13th by dr Marina Gravel -now has positive biliary leak on Hida scan noted on July 13th HIDA scan. . S/p ERCP with stent placement at [facility] on 7/13 -Incentive spirometer Advanced diet.  *  Type 2 diabetes mellitus - controlled, on sliding scale insulin with fingerstick glucose checks every 6 hours. Resume home meds once  po intake improves  * COPD (chronic obstructive pulmonary disease) - continue home inhalers.  * HTN (hypertension) - controlled, continue home meds.  * OSA on CPAP - CPAP daily at bedtime   * GERD (gastroesophageal reflux disease) - equivalent home dose PPI  * Anxiety - home medications  * Thrombocytopenia: Hold off antiplt agents or lovenox, would do SCD-TEDs  * hypokalemia. Improved. * Hypomagnesemia. Mag po.  Discussed with Dr. Rexene Edison. Medically stable, sign off.  Management plans discussed with the patient, her son and they are in agreement.  CODE STATUS: full  DVT Prophylaxis: TEDs and SCD due to low plt  TOTAL TIME TAKING CARE OF THIS PATIENT: 37 minutes.  >50% time spent on counselling and coordination of care  Demetrios Loll M.D on 07/19/2014 at 1:29 PM  Between 7am to 6pm - Pager - [phone]  After 6pm go to [email or web address] - password [email or web address] [facility]  Office  [phone]  CC: Primary care physician; Ashkin, Neldon Labella, MD

## 2014-07-20 LAB — GLUCOSE, CAPILLARY
Glucose-Capillary: 99 mg/dL (ref 65–99)
Glucose-Capillary: 123 mg/dL — ABNORMAL HIGH (ref 65–99)
Glucose-Capillary: 153 mg/dL — ABNORMAL HIGH (ref 65–99)
Glucose-Capillary: 95 mg/dL (ref 65–99)

## 2014-07-20 MED ORDER — CALCIUM CARBONATE ANTACID 500 MG PO CHEW
1.0000 | CHEWABLE_TABLET | Freq: Three times a day (TID) | ORAL | Status: DC | PRN
  Administered 2014-07-20 – 2014-07-24 (×4): 200 mg via ORAL
  Filled 2014-07-20 (×5): qty 1

## 2014-07-20 MED ORDER — MAGNESIUM HYDROXIDE 400 MG/5ML PO SUSP
15.0000 mL | Freq: Three times a day (TID) | ORAL | Status: DC | PRN
  Administered 2014-07-20 – 2014-07-22 (×3): 15 mL via ORAL
  Filled 2014-07-20 (×3): qty 30

## 2014-07-20 MED ORDER — PANTOPRAZOLE SODIUM 40 MG PO TBEC
40.0000 mg | DELAYED_RELEASE_TABLET | Freq: Every day | ORAL | Status: DC
  Administered 2014-07-20 – 2014-07-30 (×11): 40 mg via ORAL
  Filled 2014-07-20 (×11): qty 1

## 2014-07-20 NOTE — Progress Notes (Signed)
Surgery Progress Note  S: Pain improved, ambulating, + bm.  Tolerating diet O:  Blood pressure 102/55, pulse 62, temperature 98.5 F (36.9 C), temperature source Oral, resp. rate 18, height 5' 2"  (1.575 m), weight 110.859 kg (244 lb 6.4 oz), SpO2 96 %. GEN: NAD/A&Ox3 ABD: soft, mild tender, nondistended, incisions c/d/i, JP with serous fluid  A/P 49 yo F s/p cholecystectomy s/p ERCP and stent for cystic duct leak - regular diet - PO pain meds - PT consult for dispo

## 2014-07-21 ENCOUNTER — Encounter (HOSPITAL_COMMUNITY): Payer: Self-pay | Admitting: Gastroenterology

## 2014-07-21 LAB — GLUCOSE, CAPILLARY
Glucose-Capillary: 113 mg/dL — ABNORMAL HIGH (ref 65–99)
Glucose-Capillary: 105 mg/dL — ABNORMAL HIGH (ref 65–99)
Glucose-Capillary: 98 mg/dL (ref 65–99)
Glucose-Capillary: 139 mg/dL — ABNORMAL HIGH (ref 65–99)

## 2014-07-21 LAB — SURGICAL PATHOLOGY

## 2014-07-21 MED ORDER — PROMETHAZINE HCL 25 MG RE SUPP: 25.0000 mg | Freq: Three times a day (TID) | RECTAL | Status: DC | PRN

## 2014-07-21 MED ORDER — HYDROMORPHONE HCL 1 MG/ML IJ SOLN: 0.5000 mg | INTRAMUSCULAR | Status: DC | PRN

## 2014-07-21 MED ORDER — ONDANSETRON HCL 4 MG/2ML IJ SOLN
4.0000 mg | Freq: Four times a day (QID) | INTRAMUSCULAR | Status: DC | PRN
  Administered 2014-07-21 – 2014-07-29 (×16): 4 mg via INTRAVENOUS
  Filled 2014-07-21 (×17): qty 2

## 2014-07-21 MED ORDER — HYDROMORPHONE HCL 1 MG/ML IJ SOLN
0.5000 mg | INTRAMUSCULAR | Status: DC | PRN
  Administered 2014-07-21 – 2014-07-23 (×10): 0.5 mg via INTRAVENOUS
  Filled 2014-07-21 (×11): qty 1

## 2014-07-21 NOTE — Progress Notes (Signed)
Physical Therapy Treatment Patient Details Name: Lori Mckenzie MRN: [id number] DOB: Jul 08, 1965 Today's Date: 07/21/2014    History of Present Illness 49 yo female with onset of abd pain with bile leak from cannula in bile duct, stented on 07/16/14 and now PT assessing for home.  PMHx:  TIA COPD, back pain    PT Comments    Patient able to complete all mobility without assist device this date, cga/close sup; no LOB or significant safety concerns.  Remains rather guarded with limited trunk mobility/UE swing, requiring increased time to complete all tasks, but anticipate progression as pain resolves.  No drainage noted with mobility. Progressing well towards all therapy goals; will likely meet goals with additional 1-2 treatment sessions and progress towards no PT needs upon discharge.   Follow Up Recommendations  Home health PT     Equipment Recommendations  None recommended by PT    Recommendations for Other Services       Precautions / Restrictions Precautions Precautions: Fall Precaution Comments: Abdominal incision with R quadrant JP drain Restrictions Weight Bearing Restrictions: No    Mobility  Bed Mobility               General bed mobility comments: seated in chair beginning/end of session  Transfers Overall transfer level: Needs assistance Equipment used: None Transfers: Sit to/from Stand           General transfer comment: close sup; UEs for support to assist with lift off and lowering  Ambulation/Gait Ambulation/Gait assistance: Min guard;Supervision Ambulation Distance (Feet): 220 Feet Assistive device: None       General Gait Details: step through gait pattern with fair step height/length; guarded trunk posturing with limited rotation and arm swing.  Slow, but fairly steady gait performance; no buckling/LOB   Stairs            Wheelchair Mobility    Modified Rankin (Stroke Patients Only)       Balance                                     Cognition                            Exercises Other Exercises Other Exercises: Standing LE therex, 1x10, AROM with single UE support (on counter top): heel raises, mini squats, hip flex/ext/abduct/adduct.  Seated rest break after 1-2 sets of therex due to abdominal pain, nausea (resolves with rest periods).    General Comments        Pertinent Vitals/Pain Pain Assessment: 0-10 Pain Score: 6  Pain Location: abdomen Pain Descriptors / Indicators: Aching;Spasm;Sore Pain Intervention(s): Limited activity within patient's tolerance;Monitored during session;Premedicated before session;Repositioned    Home Living                      Prior Function            PT Goals (current goals can now be found in the care plan section) Acute Rehab PT Goals Patient Stated Goal: to have a place to go home PT Goal Formulation: With patient/family Time For Goal Achievement: 07/31/14 Potential to Achieve Goals: Good Progress towards PT goals: Progressing toward goals    Frequency  Min 2X/week    PT Plan Current plan remains appropriate    Co-evaluation  End of Session Equipment Utilized During Treatment: Gait belt Activity Tolerance: Patient tolerated treatment well Patient left: in bed;with call bell/phone within reach;with family/visitor present     Time: 0901-0921 PT Time Calculation (min) (ACUTE ONLY): 20 min  Charges:  $Gait Training: 8-22 mins                    G Codes:      Eleshia Wooley H. Owens Shark, PT, DPT, NCS 07/21/2014, 10:07 AM [phone]

## 2014-07-21 NOTE — Progress Notes (Signed)
  Subjective: Status post laparoscopic cholecystectomy with subsequent biliary leak and ERCP with stenting. He has ongoing pain in the right upper quadrant and nausea is not vomited.  Objective: Vital signs in last 24 hours: Temp:  [98.1 F (36.7 C)-98.5 F (36.9 C)] 98.1 F (36.7 C) (07/18 0813) Pulse Rate:  [73-88] 83 (07/18 0813) Resp:  [17-20] 17 (07/18 0813) BP: (92-109)/(58-69) 107/58 mmHg (07/18 0813) SpO2:  [91 %-97 %] 91 % (07/18 0813) Last BM Date: 07/19/14  Intake/Output from previous day: 07/17 0701 - 07/18 0700 In: 76 [P.O.:480] Out: 3410 [Urine:3400; Drains:10] Intake/Output this shift: Total I/O In: 480 [P.O.:480] Out: 0   Physical exam:  Morbid obesity female patient. She appears in no acute distress. Abdomen is soft nontender drain is in place with no bile in the drain serous fluid only. No scleral icterus no jaundice. Wounds otherwise dressed.  Lab Results: CBC  No results for input(s): WBC, HGB, HCT, PLT in the last 72 hours. BMET  Recent Labs  07/19/14 0809  NA 139  K 4.0  CL 104  CO2 28  GLUCOSE 118*  BUN <5*  CREATININE 0.57  CALCIUM 7.9*   PT/INR No results for input(s): LABPROT, INR in the last 72 hours. ABG No results for input(s): PHART, HCO3 in the last 72 hours.  Invalid input(s): PCO2, PO2  Studies/Results: US Venous Img Upper Uni Left  07/19/2014   CLINICAL DATA:  49 year old female with left upper extremity swelling  EXAM: LEFT UPPER EXTREMITY VENOUS DOPPLER ULTRASOUND  TECHNIQUE: Gray-scale sonography with graded compression, as well as color Doppler and duplex ultrasound were performed to evaluate the upper extremity deep venous system from the level of the subclavian vein and including the jugular, axillary, basilic, radial, ulnar and upper cephalic vein. Spectral Doppler was utilized to evaluate flow at rest and with distal augmentation maneuvers.  COMPARISON:  None.  FINDINGS: Contralateral Subclavian Vein: Respiratory  phasicity is normal and symmetric with the symptomatic side. No evidence of thrombus. Normal compressibility.  Internal Jugular Vein: No evidence of thrombus. Normal compressibility, respiratory phasicity and response to augmentation.  Subclavian Vein: No evidence of thrombus. Normal compressibility, respiratory phasicity and response to augmentation.  Axillary Vein: No evidence of thrombus. Normal compressibility, respiratory phasicity and response to augmentation.  Cephalic Vein: No evidence of thrombus. Normal compressibility, respiratory phasicity and response to augmentation.  Basilic Vein: No evidence of thrombus. Normal compressibility, respiratory phasicity and response to augmentation.  Brachial Veins: No evidence of thrombus. Normal compressibility, respiratory phasicity and response to augmentation.  Radial Veins: No evidence of thrombus. Normal compressibility, respiratory phasicity and response to augmentation.  Ulnar Veins: No evidence of thrombus. Normal compressibility, respiratory phasicity and response to augmentation.  Venous Reflux:  None visualized.  Other Findings:  None visualized.  IMPRESSION: No evidence of deep venous thrombosis.   Electronically Signed   By: Jacqulynn Cadet M.D.   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 This seems to be resolving after ERCP and stent placement. We will review medications and discussed with RN concerning changing nausea and pain medications. Any drain for now.  Florene Glen, MD, FACS  07/21/2014

## 2014-07-21 NOTE — Progress Notes (Signed)
Pt was requesting a heating pad for abdomen - spoke with Dr. Pat Patrick and MD [facility] this.

## 2014-07-22 ENCOUNTER — Encounter: Payer: Self-pay | Admitting: Radiology

## 2014-07-22 ENCOUNTER — Inpatient Hospital Stay: Payer: Medicaid Other

## 2014-07-22 LAB — CBC WITH DIFFERENTIAL/PLATELET
Basophils Absolute: 0 10*3/uL (ref 0–0.1)
Basophils Relative: 1 %
Eosinophils Absolute: 0.2 10*3/uL (ref 0–0.7)
Eosinophils Relative: 2 %
HCT: 37.8 % (ref 35.0–47.0)
Hemoglobin: 12.9 g/dL (ref 12.0–16.0)
Lymphocytes Relative: 19 %
Lymphs Abs: 1.6 10*3/uL (ref 1.0–3.6)
MCH: 31.9 pg (ref 26.0–34.0)
MCHC: 34.2 g/dL (ref 32.0–36.0)
MCV: 93.4 fL (ref 80.0–100.0)
Monocytes Absolute: 0.7 10*3/uL (ref 0.2–0.9)
Monocytes Relative: 8 %
Neutro Abs: 6.3 10*3/uL (ref 1.4–6.5)
Neutrophils Relative %: 70 %
Platelets: 161 10*3/uL (ref 150–440)
RBC: 4.05 MIL/uL (ref 3.80–5.20)
RDW: 13.6 % (ref 11.5–14.5)
WBC: 8.8 10*3/uL (ref 3.6–11.0)

## 2014-07-22 LAB — COMPREHENSIVE METABOLIC PANEL
ALT: 16 U/L (ref 14–54)
AST: 21 U/L (ref 15–41)
Albumin: 3.4 g/dL — ABNORMAL LOW (ref 3.5–5.0)
Alkaline Phosphatase: 55 U/L (ref 38–126)
Anion gap: 12 (ref 5–15)
BUN: 10 mg/dL (ref 6–20)
CO2: 31 mmol/L (ref 22–32)
Calcium: 8.7 mg/dL — ABNORMAL LOW (ref 8.9–10.3)
Chloride: 92 mmol/L — ABNORMAL LOW (ref 101–111)
Creatinine, Ser: 0.7 mg/dL (ref 0.44–1.00)
GFR calc Af Amer: >60 mL/min (ref >60)
GFR calc non Af Amer: >60 mL/min (ref >60)
Glucose, Bld: 126 mg/dL — ABNORMAL HIGH (ref 65–99)
Potassium: 3.2 mmol/L — ABNORMAL LOW (ref 3.5–5.1)
Sodium: 135 mmol/L (ref 135–145)
Total Bilirubin: 1.3 mg/dL — ABNORMAL HIGH (ref 0.3–1.2)
Total Protein: 7.1 g/dL (ref 6.5–8.1)

## 2014-07-22 LAB — GLUCOSE, CAPILLARY
Glucose-Capillary: 114 mg/dL — ABNORMAL HIGH (ref 65–99)
Glucose-Capillary: 101 mg/dL — ABNORMAL HIGH (ref 65–99)
Glucose-Capillary: 106 mg/dL — ABNORMAL HIGH (ref 65–99)
Glucose-Capillary: 126 mg/dL — ABNORMAL HIGH (ref 65–99)

## 2014-07-22 MED ORDER — SODIUM CHLORIDE 0.9 % IV SOLN
INTRAVENOUS | Status: DC
  Administered 2014-07-23: 10:00:00 via INTRAVENOUS

## 2014-07-22 MED ORDER — METHOCARBAMOL 500 MG PO TABS
500.0000 mg | ORAL_TABLET | Freq: Two times a day (BID) | ORAL | Status: DC
  Administered 2014-07-22 – 2014-07-30 (×16): 500 mg via ORAL
  Filled 2014-07-22 (×18): qty 1

## 2014-07-22 MED ORDER — IOHEXOL 300 MG/ML  SOLN
100.0000 mL | Freq: Once | INTRAMUSCULAR | Status: AC | PRN
  Administered 2014-07-22: 100 mL via INTRAVENOUS

## 2014-07-22 MED ORDER — IOHEXOL 240 MG/ML SOLN
25.0000 mL | INTRAMUSCULAR | Status: AC
  Administered 2014-07-22 (×2): 25 mL via ORAL

## 2014-07-22 MED ORDER — POTASSIUM CHLORIDE 20 MEQ PO PACK
20.0000 meq | PACK | Freq: Two times a day (BID) | ORAL | Status: DC
  Administered 2014-07-22 – 2014-07-29 (×15): 20 meq via ORAL
  Filled 2014-07-22 (×16): qty 1

## 2014-07-22 NOTE — Progress Notes (Signed)
Personal review of the CT scan demonstrates a small fluid collection which is not easily accessible in the right upper quadrant all bladder fossa there is minimal enhancement but there are some air bubbles in it. With the presence of the stent and an adjacent drain as well as no fever and a normal white blood cell count and normal LFTs, I would not recommend aggressive drainage in the small fluid collection. Especially with the consideration of its location and the risk for potential complications. Will follow patient clinically and consider drainage if it were to increase or the need for repeat CT is needed later.

## 2014-07-22 NOTE — Care Management Note (Signed)
Case Management Note  Patient Details  Name: Lori Mckenzie MRN: [id number] Date of Birth: 02/06/1965  Subjective/Objective:  CT today for further evaluation of drainage at [facility] site. Patient has Medicaid and home health  PT is not a covered service. Staff reports patient is walking, independent with limited needs. [facility] an option if PT continues to be a need at discharge.              Action/Plan: CT today  Expected Discharge Date:                  Expected Discharge Plan:     In-House Referral:     Discharge planning Services     Post Acute Care Choice:    Choice offered to:     DME Arranged:    DME Agency:     HH Arranged:    [place] Agency:     Status of Service:     Medicare Important Message Given:    Date Medicare IM Given:    Medicare IM give by:    Date Additional Medicare IM Given:    Additional Medicare Important Message give by:     If discussed at [place] of Stay Meetings, dates discussed:    Additional Comments:  Jolly Mango, RN 07/22/2014, 9:42 AM

## 2014-07-22 NOTE — Progress Notes (Signed)
PT Cancellation Note  Patient Details Name: Lori Mckenzie MRN: [id number] DOB: April 21, 1965   Cancelled Treatment:    Reason Eval/Treat Not Completed: Patient declined, no reason specified (Tretment attempter but pt declined secondary to upcoming CT scan and worries about her leakage. )   Bernestine Amass, SPT 07/22/2014 9:09 AM

## 2014-07-22 NOTE — Progress Notes (Signed)
Nutrition Follow-up       INTERVENTION:   Meals and snacks: Cater to pt preferences once back on po diet Nutrition Supplement Therapy: Will continue boost breeze TID, may considering switching to ensure once diet progressed  NUTRITION DIAGNOSIS:   Inadequate oral intake related to altered GI function as evidenced by meal completion < 50%, intake improving while on solid foods.    GOAL:   Patient will meet greater than or equal to 90% of their needs    MONITOR:    (Energy intake, Digestive system, Anthropometric)  REASON FOR ASSESSMENT:   Malnutrition Screening Tool    ASSESSMENT:    Planning CT of abdomen today   Current Nutrition: NPO, previously tolerating jello, few bites of meat, rice.  Intake overall continue to be decreased with abdominal pain and indigestion per pt.  Drinking boost breeze, noted multiple at bedside   Gastrointestinal Profile: reports some abdominal pain and nausea Last BM: 7/17   Medications: reviewed  Electrolyte/Renal Profile and Glucose Profile:   Recent Labs Lab 07/18/14 0421 07/19/14 0809 07/22/14 0409  NA 140 139 135  K 3.3* 4.0 3.2*  CL 102 104 92*  CO2 31 28 31   BUN 5* <5* 10  CREATININE 0.66 0.57 0.70  CALCIUM 8.1* 7.9* 8.7*  MG  --  1.6*  --   GLUCOSE 115* 118* 126*   Protein Profile:  Recent Labs Lab 07/17/14 0144 07/18/14 0421 07/22/14 0409  ALBUMIN 3.5 3.2* 3.4*      Weight Trend since Admission: Filed Weights   07/18/14 0100  Weight: 244 lb 6.4 oz (110.859 kg)      Diet Order:  Diet NPO time specified  Skin:  Reviewed, no issues   Height:   Ht Readings from Last 1 Encounters:  07/18/14 5' 2"  (1.575 m)    Weight:   Wt Readings from Last 1 Encounters:  07/18/14 244 lb 6.4 oz (110.859 kg)    Ideal Body Weight:  50 kg   BMI:  Body mass index is 44.69 kg/(m^2).  Estimated Nutritional Needs:   Kcal:  Using IBW of 50kg (BEE 1068 kcals (IF 1.0-1.3, AF 1.3) 9381-8299  kcals/d  Protein:  Using IBW of 50kg (1.0-1.2 g/kg) 50-60 g/d  Fluid:  Using IBW of 50kg (25-37m/kg) 1250-15066m  EDUCATION NEEDS:   No education needs identified at this time  MODERATE Care Level Lori Blakeney B. AlZenia ResidesRDPollockLDGrandviewpager)

## 2014-07-22 NOTE — Progress Notes (Signed)
  Subjective: Postop laparoscopic cholecystectomy with biliary fistula and ERCP and stent. Patient describes ongoing right upper quadrant pain her nausea is much better controlled now. She had multiple questions concerning her medications including home medications and some confusion about medicines in the hospital.  Objective: Vital signs in last 24 hours: Temp:  [98.4 F (36.9 C)-99 F (37.2 C)] 98.7 F (37.1 C) (07/19 0813) Pulse Rate:  [78-89] 78 (07/19 0813) Resp:  [16-17] 16 (07/19 0813) BP: (88-104)/(49-66) 93/66 mmHg (07/19 0813) SpO2:  [91 %-95 %] 95 % (07/19 0813) Last BM Date: 07/20/14  Intake/Output from previous day: 07/18 0701 - 07/19 0700 In: 1250 [P.O.:1250] Out: 1292.5 [Urine:1275; Drains:17.5] Intake/Output this shift: Total I/O In: -  Out: 250 [Urine:250]  Physical exam:  Patient was originally up and walking and appeared well appearing signs were reviewed her abdomen is soft and essentially nontender there is no bilious drainage in her JP there is some serous drainage only and some seropurulent drainage around the drain site. Calves are nontender no scleral icterus no jaundice. Labs are personally reviewed as well.  Lab Results: CBC   Recent Labs  07/22/14 0409  WBC 8.8  HGB 12.9  HCT 37.8  PLT 161   BMET  Recent Labs  07/22/14 0409  NA 135  K 3.2*  CL 92*  CO2 31  GLUCOSE 126*  BUN 10  CREATININE 0.70  CALCIUM 8.7*   PT/INR No results for input(s): LABPROT, INR in the last 72 hours. ABG No results for input(s): PHART, HCO3 in the last 72 hours.  Invalid input(s): PCO2, PO2  Studies/Results: No results found.  Anti-infectives: Anti-infectives    None      Assessment/Plan: s/p    Biliary fistula following a laparoscopic cholecystectomy. The drainage is nonbilious and the JP drain but is my understanding after discussing with Dr. Pat Patrick that this patient did have some drainage that was not collected by the JP she has ongoing  right upper quadrant pain and I will obtain a CT scan today. Her labs are normal. She has no sign of elevated LFTs or leukocytosis. I agreed to review her medication list as well. Trying to optimize her nausea and pain control and restarting home medicines.  Florene Glen, MD, FACS  07/22/2014

## 2014-07-22 NOTE — Progress Notes (Signed)
Personally reviewed CT scan with the the radiologist. He feels that the fluid collection is accessible to percutaneous drainage.  and while I believe this is not likely to be an abscess the patient would benefit from drainage as it will likely resolve quicker with percutaneous drainage. I will discuss this with the patient and her family and schedule the procedure for tomorrow morning.

## 2014-07-23 ENCOUNTER — Inpatient Hospital Stay
Admit: 2014-07-23 | Discharge: 2014-07-23 | Disposition: A | Discharge status: Home / Self Care | Payer: Medicaid Other | Source: Other Acute Inpatient Hospital | Attending: Surgery | Admitting: Surgery

## 2014-07-23 LAB — CBC WITH DIFFERENTIAL/PLATELET
Basophils Absolute: 0 10*3/uL (ref 0–0.1)
Basophils Relative: 0 %
Eosinophils Absolute: 0.1 10*3/uL (ref 0–0.7)
Eosinophils Relative: 2 %
HCT: 33.1 % — ABNORMAL LOW (ref 35.0–47.0)
Hemoglobin: 11.4 g/dL — ABNORMAL LOW (ref 12.0–16.0)
Lymphocytes Relative: 23 %
Lymphs Abs: 1.3 10*3/uL (ref 1.0–3.6)
MCH: 31.9 pg (ref 26.0–34.0)
MCHC: 34.3 g/dL (ref 32.0–36.0)
MCV: 93 fL (ref 80.0–100.0)
Monocytes Absolute: 0.5 10*3/uL (ref 0.2–0.9)
Monocytes Relative: 8 %
Neutro Abs: 3.7 10*3/uL (ref 1.4–6.5)
Neutrophils Relative %: 67 %
Platelets: 141 10*3/uL — ABNORMAL LOW (ref 150–440)
RBC: 3.56 MIL/uL — ABNORMAL LOW (ref 3.80–5.20)
RDW: 13.5 % (ref 11.5–14.5)
WBC: 5.7 10*3/uL (ref 3.6–11.0)

## 2014-07-23 LAB — COMPREHENSIVE METABOLIC PANEL
ALT: 13 U/L — ABNORMAL LOW (ref 14–54)
AST: 15 U/L (ref 15–41)
Albumin: 3 g/dL — ABNORMAL LOW (ref 3.5–5.0)
Alkaline Phosphatase: 50 U/L (ref 38–126)
Anion gap: 9 (ref 5–15)
BUN: 9 mg/dL (ref 6–20)
CO2: 33 mmol/L — ABNORMAL HIGH (ref 22–32)
Calcium: 8.3 mg/dL — ABNORMAL LOW (ref 8.9–10.3)
Chloride: 95 mmol/L — ABNORMAL LOW (ref 101–111)
Creatinine, Ser: 0.65 mg/dL (ref 0.44–1.00)
GFR calc Af Amer: >60 mL/min (ref >60)
GFR calc non Af Amer: >60 mL/min (ref >60)
Glucose, Bld: 112 mg/dL — ABNORMAL HIGH (ref 65–99)
Potassium: 3.5 mmol/L (ref 3.5–5.1)
Sodium: 137 mmol/L (ref 135–145)
Total Bilirubin: 0.8 mg/dL (ref 0.3–1.2)
Total Protein: 6.4 g/dL — ABNORMAL LOW (ref 6.5–8.1)

## 2014-07-23 LAB — PROTIME-INR
INR: 1.26
Prothrombin Time: 16 seconds — ABNORMAL HIGH (ref 11.4–15.0)

## 2014-07-23 LAB — GLUCOSE, CAPILLARY
Glucose-Capillary: 104 mg/dL — ABNORMAL HIGH (ref 65–99)
Glucose-Capillary: 143 mg/dL — ABNORMAL HIGH (ref 65–99)
Glucose-Capillary: 148 mg/dL — ABNORMAL HIGH (ref 65–99)

## 2014-07-23 MED ORDER — MIDAZOLAM HCL 2 MG/2ML IJ SOLN
INTRAMUSCULAR | Status: AC | PRN
  Administered 2014-07-23 (×3): 1 mg via INTRAVENOUS

## 2014-07-23 MED ORDER — MIDAZOLAM HCL 5 MG/5ML IJ SOLN
INTRAMUSCULAR | Status: AC
  Filled 2014-07-23: qty 10

## 2014-07-23 MED ORDER — HYDROMORPHONE HCL 1 MG/ML IJ SOLN
1.0000 mg | INTRAMUSCULAR | Status: DC | PRN
  Administered 2014-07-23 – 2014-07-26 (×20): 1 mg via INTRAVENOUS
  Filled 2014-07-23 (×20): qty 1

## 2014-07-23 MED ORDER — FENTANYL CITRATE (PF) 100 MCG/2ML IJ SOLN
INTRAMUSCULAR | Status: AC | PRN
  Administered 2014-07-23: 50 ug via INTRAVENOUS
  Administered 2014-07-23 (×2): 25 ug via INTRAVENOUS
  Administered 2014-07-23: 50 ug via INTRAVENOUS

## 2014-07-23 MED ORDER — FENTANYL CITRATE (PF) 100 MCG/2ML IJ SOLN
INTRAMUSCULAR | Status: AC
  Filled 2014-07-23: qty 4

## 2014-07-23 NOTE — Procedures (Signed)
Discussed procedure and risks with patient. Informed consent obtained. Will perform CT-guided drainage catheter placement of probable biloma.

## 2014-07-23 NOTE — Progress Notes (Signed)
PT Cancellation Note  Patient Details Name: Lori Mckenzie MRN: [id number] DOB: [birth year]   Cancelled Treatment:    Reason Eval/Treat Not Completed: Patient at procedure or test/unavailable (Treatment attempted; patient currently off unit for procedure (drainage of R UQ fluid collection).  Will re-attempt at later time/date as patient available and medically appropiate.)   Nguyet Mercer H. Owens Shark, PT, DPT, NCS 07/23/2014, 10:20 AM [phone]

## 2014-07-23 NOTE — Progress Notes (Signed)
Subjective: Biliary fistula with biloma. Still having pain. For drainage with CT guidance this morning. LFTs remained normal. As does white blood cell count.  Objective: Vital signs in last 24 hours: Temp:  [98 F (36.7 C)-98.6 F (37 C)] 98 F (36.7 C) (07/20 0742) Pulse Rate:  [72-92] 72 (07/20 0742) Resp:  [16-18] 18 (07/20 0742) BP: (91-107)/(56-69) 91/57 mmHg (07/20 0742) SpO2:  [92 %-96 %] 96 % (07/20 0759) Last BM Date: 07/20/14  Intake/Output from previous day: 07/19 0701 - 07/20 0700 In: 474 [P.O.:474] Out: 1650 [Urine:1650] Intake/Output this shift: Total I/O In: -  Out: 700 [Urine:700]  Physical exam:  Soft nontender abdomen with no bile in drain. She appears uncomfortable  Lab Results: CBC   Recent Labs  07/22/14 0409 07/23/14 0417  WBC 8.8 5.7  HGB 12.9 11.4*  HCT 37.8 33.1*  PLT 161 141*   BMET  Recent Labs  07/22/14 0409 07/23/14 0417  NA 135 137  K 3.2* 3.5  CL 92* 95*  CO2 31 33*  GLUCOSE 126* 112*  BUN 10 9  CREATININE 0.70 0.65  CALCIUM 8.7* 8.3*   PT/INR  Recent Labs  07/23/14 0417  LABPROT 16.0*  INR 1.26   ABG No results for input(s): PHART, HCO3 in the last 72 hours.  Invalid input(s): PCO2, PO2  Studies/Results: Ct Abdomen Pelvis W Contrast  07/22/2014   CLINICAL DATA:  49 year old female post laparoscopic cholecystectomy 07/16/2014 found to have bile leak treated with stent placement. Still experiencing pain. Subsequent encounter.  EXAM: CT ABDOMEN AND PELVIS WITH CONTRAST  TECHNIQUE: Multidetector CT imaging of the abdomen and pelvis was performed using the standard protocol following bolus administration of intravenous contrast.  CONTRAST:  173m OMNIPAQUE IOHEXOL 300 MG/ML  SOLN  COMPARISON:  07/16/2014 ERCP. 07/13/2014 MR abdomen. 05/29/2014 CT abdomen and pelvis.  FINDINGS: Post cholecystectomy. Extending from the gallbladder fossa/operative site anteriorly and inferiorly is loculated 7.9 x 6.2 x 6.3 cm fluid and  gas collection. This is consistent with bile leak. The presence of gas may reflect changes of an abscess as versus injected gas from stent placement. A percutaneous drain has been placed from the right. The tip of the drain is inferior and posterior to majority of the above described postoperative collection. Correlation with drainage amount recommended to determine if this needs to be repositioned.  Stent extends from the distal common bile duct through the pancreatic head into the second portion of the duodenum. Common bile duct measures up to 8.5 mm. Minimal prominence major intrahepatic biliary ducts without branch vessel dilation.  Enlarged liver spanning over 23 cm containing cysts. Splenomegaly spanning over 18.6 cm length. Portal vein and prominent splenic veins are patent.  Peripancreatic adenopathy with shotty intra-aortic caval lymph nodes of questionable etiology.  Small amount of free fluid in the pelvis may be related to the bile leak.  No definitive primary bowel inflammatory process.  Subcutaneous changes from surgery and STIR percutaneous catheter noted.  No worrisome pancreatic, adrenal or renal lesion.  Mild calcification lower abdominal aorta are consistent with slightly age advanced atherosclerotic type changes. No abdominal aortic aneurysm.  Degenerative changes lower thoracic and lumbar spine with scattered protrusion some which are calcified. Largest protrusion T10-11 level with mass effect upon the cord. This can be assessed with follow-up MR when patient is able. Spinal stenosis lower lumbar region. Facet joint degenerative changes.  Noncontrast filled views of the urinary bladder unremarkable. Post hysterectomy.  Bibasilar subsegmental atelectatic changes greater on the right.  Ascending thoracic aorta measures 4.2 cm. Recommend annual imaging followup by CTA or MRA. This recommendation follows 2010 ACCF/AHA/AATS/ACR/ASA/SCA/SCAI/SIR/STS/SVM Guidelines for the Diagnosis and Management of  Patients with Thoracic Aortic Disease. Circulation. 2010; 121: Q333-L456.  IMPRESSION: Post cholecystectomy. Extending from the gallbladder fossa/operative site anteriorly and inferiorly is loculated 7.9 x 6.2 x 6.3 cm fluid and gas collection. This is consistent with bile leak. The presence of gas may reflect changes of an abscess as versus injected gas from stent placement. A percutaneous drain has been placed from the right. The tip of the drain is inferior and posterior to majority of the above described postoperative collection. Correlation with drainage amount recommended to determine if this needs to be repositioned.  Stent extends from the distal common bile duct through the pancreatic head into the second portion of the duodenum. Common bile duct measures up to 8.5 mm. Minimal prominence major intrahepatic biliary ducts without branch vessel dilation.  Enlarged liver spanning over 23 cm. Splenomegaly spanning over 18.6 cm length.  Peripancreatic adenopathy with shotty intra-aortic caval lymph nodes of questionable etiology.  Small amount of free fluid in the pelvis may be related to the bile leak.  Degenerative changes lower thoracic and lumbar spine with scattered protrusion some which are calcified. Largest protrusion T10-11 level with mass effect upon the cord. This can be assessed with follow-up MR when patient is able. Spinal stenosis lower lumbar region.  Ascending thoracic aorta measures 4.2 cm. Recommend annual imaging followup by CTA or MRA. This recommendation follows 2010 ACCF/AHA/AATS/ACR/ASA/SCA/SCAI/SIR/STS/SVM Guidelines for the Diagnosis and Management of Patients with Thoracic Aortic Disease. Circulation. 2010; 121: Y563-S937.   Electronically Signed   By: Genia Del M.D.   On: 07/22/2014 15:07    Anti-infectives: Anti-infectives    None      Assessment/Plan: s/p    CT-guided drainage of biloma this morning it is pending over this will improve some of her pain complex. White  blood cell count and LFTs remained normal.  Florene Glen, MD, FACS  07/23/2014

## 2014-07-23 NOTE — Procedures (Signed)
Under CT guidance, 64F drainage catheter placed into fluid collection in GB fossa. 10 mls of serous fluid removed. Hooked up to JP bulb. No immediate complication.

## 2014-07-24 LAB — CBC WITH DIFFERENTIAL/PLATELET
Basophils Absolute: 0 10*3/uL (ref 0–0.1)
Basophils Relative: 1 %
Eosinophils Absolute: 0.1 10*3/uL (ref 0–0.7)
Eosinophils Relative: 2 %
HCT: 32.1 % — ABNORMAL LOW (ref 35.0–47.0)
Hemoglobin: 10.9 g/dL — ABNORMAL LOW (ref 12.0–16.0)
Lymphocytes Relative: 29 %
Lymphs Abs: 1.5 10*3/uL (ref 1.0–3.6)
MCH: 31.9 pg (ref 26.0–34.0)
MCHC: 33.9 g/dL (ref 32.0–36.0)
MCV: 93.9 fL (ref 80.0–100.0)
Monocytes Absolute: 0.5 10*3/uL (ref 0.2–0.9)
Monocytes Relative: 9 %
Neutro Abs: 3 10*3/uL (ref 1.4–6.5)
Neutrophils Relative %: 59 %
Platelets: 135 10*3/uL — ABNORMAL LOW (ref 150–440)
RBC: 3.42 MIL/uL — ABNORMAL LOW (ref 3.80–5.20)
RDW: 13.5 % (ref 11.5–14.5)
WBC: 5.1 10*3/uL (ref 3.6–11.0)

## 2014-07-24 LAB — COMPREHENSIVE METABOLIC PANEL
ALT: 11 U/L — ABNORMAL LOW (ref 14–54)
AST: 15 U/L (ref 15–41)
Albumin: 3.1 g/dL — ABNORMAL LOW (ref 3.5–5.0)
Alkaline Phosphatase: 60 U/L (ref 38–126)
Anion gap: 7 (ref 5–15)
BUN: 9 mg/dL (ref 6–20)
CO2: 32 mmol/L (ref 22–32)
Calcium: 8 mg/dL — ABNORMAL LOW (ref 8.9–10.3)
Chloride: 99 mmol/L — ABNORMAL LOW (ref 101–111)
Creatinine, Ser: 0.75 mg/dL (ref 0.44–1.00)
GFR calc Af Amer: >60 mL/min (ref >60)
GFR calc non Af Amer: >60 mL/min (ref >60)
Glucose, Bld: 122 mg/dL — ABNORMAL HIGH (ref 65–99)
Potassium: 3.7 mmol/L (ref 3.5–5.1)
Sodium: 138 mmol/L (ref 135–145)
Total Bilirubin: 0.7 mg/dL (ref 0.3–1.2)
Total Protein: 6.3 g/dL — ABNORMAL LOW (ref 6.5–8.1)

## 2014-07-24 LAB — GLUCOSE, CAPILLARY
Glucose-Capillary: 80 mg/dL (ref 65–99)
Glucose-Capillary: 141 mg/dL — ABNORMAL HIGH (ref 65–99)
Glucose-Capillary: 114 mg/dL — ABNORMAL HIGH (ref 65–99)
Glucose-Capillary: 162 mg/dL — ABNORMAL HIGH (ref 65–99)

## 2014-07-24 NOTE — Progress Notes (Signed)
Physical Therapy Treatment Patient Details Name: Lori Mckenzie MRN: [id number] DOB: May 10, 1965 Today's Date: 07/24/2014    History of Present Illness 49 yo female with onset of abd pain with bile leak from cannula in bile duct, stented on 07/16/14; abscess drainage 7/20.  PMHx:  TIA COPD, back pain    PT Comments    Patient with improved pain control, activity tolerance.  Reports mobility within room and hospital unit indep since previous PT session.  Denies difficulty or concern with mobility. Able to demonstrate all functional mobility (bed mobility, basic transfers and gait) at mod indep level without assist device; up/down stairs (6 with single rail), sup.  No safety concerns or skilled PT needs identified at this time.  Safe for discharge home when medically appropriate.   All PT goals met; will discontinue skilled PT services at this time.  Did encouraged continued mobility throughout unit as tolerated (at least 3x/day) during remaining hospitalization.  Patient voiced understanding.  Please re-consult should needs change.   Follow Up Recommendations  No PT follow up     Equipment Recommendations       Recommendations for Other Services       Precautions / Restrictions Precautions Precautions: Fall Precaution Comments: Abdominal incision with R quadrant JP drain Restrictions Weight Bearing Restrictions: No    Mobility  Bed Mobility Overal bed mobility: Independent                Transfers Overall transfer level: Modified independent Equipment used: None Transfers: Sit to/from Stand           General transfer comment: no UE support required; good safety/stability  Ambulation/Gait Ambulation/Gait assistance: Modified independent (Device/Increase time) Ambulation Distance (Feet): 250 Feet Assistive device: None       General Gait Details: step through gait pattern with fair step height/length; guarded trunk posturing with limited rotation and arm swing.   Slow, but fairly steady gait performance; no buckling/LOB.  Able to complete simple dynamic gait components (head turns, changes of direction, obstacle negotiation) without safety concern or LOB   Stairs Stairs: Yes Stairs assistance: Supervision Stair Management: One rail Right Number of Stairs: 6 General stair comments: step to gait pattern; good LE strength and knee control.  No buckling or LOB.  Wheelchair Mobility    Modified Rankin (Stroke Patients Only)       Balance                                    Cognition                            Exercises      General Comments        Pertinent Vitals/Pain Pain Assessment: Faces Pain Score: 4  Pain Location: abdomen Pain Descriptors / Indicators: Aching Pain Intervention(s): Limited activity within patient's tolerance;Monitored during session;Repositioned    Home Living                      Prior Function            PT Goals (current goals can now be found in the care plan section) Acute Rehab PT Goals Patient Stated Goal: "I will walk today, but that's it" PT Goal Formulation: With patient Time For Goal Achievement: 07/31/14 Potential to Achieve Goals: Good Progress towards PT goals: Goals met/education completed, patient discharged  from PT    Frequency       PT Plan Discharge plan needs to be updated    Co-evaluation             End of Session Equipment Utilized During Treatment: Gait belt Activity Tolerance: Patient tolerated treatment well Patient left: in chair;with call bell/phone within reach     Time: 0903-0919 PT Time Calculation (min) (ACUTE ONLY): 16 min  Charges:  $Gait Training: 8-22 mins                    G Codes:      Durenda Pechacek H. Owens Shark, PT, DPT, NCS 07/24/2014, 10:01 AM [phone]

## 2014-07-24 NOTE — Progress Notes (Signed)
Subjective: Biliary fistula status post drainage of serous fluid approximately 10 cc was CT-guided drainage from the right upper quadrant yesterday. Better today  Objective: Vital signs in last 24 hours: Temp:  [97.6 F (36.4 C)-98.6 F (37 C)] 97.6 F (36.4 C) (07/21 0810) Pulse Rate:  [63-84] 63 (07/21 0810) Resp:  [13-28] 19 (07/21 0810) BP: (82-114)/(50-88) 98/56 mmHg (07/21 0810) SpO2:  [93 %-98 %] 98 % (07/21 0810) Last BM Date: 07/24/14  Intake/Output from previous day: 07/20 0701 - 07/21 0700 In: 215 [P.O.:120] Out: 1976 [Urine:1975; Stool:1] Intake/Output this shift: Total I/O In: 480 [P.O.:480] Out: 733 [Urine:700; Drains:33]  Physical exam:  Abdomen is soft and nontender no bile in either of the 2 drains no scleral icterus.  Lab Results: CBC   Recent Labs  07/23/14 0417 07/24/14 0420  WBC 5.7 5.1  HGB 11.4* 10.9*  HCT 33.1* 32.1*  PLT 141* 135*   BMET  Recent Labs  07/23/14 0417 07/24/14 0420  NA 137 138  K 3.5 3.7  CL 95* 99*  CO2 33* 32  GLUCOSE 112* 122*  BUN 9 9  CREATININE 0.65 0.75  CALCIUM 8.3* 8.0*   PT/INR  Recent Labs  07/23/14 0417  LABPROT 16.0*  INR 1.26   ABG No results for input(s): PHART, HCO3 in the last 72 hours.  Invalid input(s): PCO2, PO2  Studies/Results: Ct Abdomen Pelvis W Contrast  07/22/2014   CLINICAL DATA:  49 year old female post laparoscopic cholecystectomy 07/16/2014 found to have bile leak treated with stent placement. Still experiencing pain. Subsequent encounter.  EXAM: CT ABDOMEN AND PELVIS WITH CONTRAST  TECHNIQUE: Multidetector CT imaging of the abdomen and pelvis was performed using the standard protocol following bolus administration of intravenous contrast.  CONTRAST:  134m OMNIPAQUE IOHEXOL 300 MG/ML  SOLN  COMPARISON:  07/16/2014 ERCP. 07/13/2014 MR abdomen. 05/29/2014 CT abdomen and pelvis.  FINDINGS: Post cholecystectomy. Extending from the gallbladder fossa/operative site anteriorly and  inferiorly is loculated 7.9 x 6.2 x 6.3 cm fluid and gas collection. This is consistent with bile leak. The presence of gas may reflect changes of an abscess as versus injected gas from stent placement. A percutaneous drain has been placed from the right. The tip of the drain is inferior and posterior to majority of the above described postoperative collection. Correlation with drainage amount recommended to determine if this needs to be repositioned.  Stent extends from the distal common bile duct through the pancreatic head into the second portion of the duodenum. Common bile duct measures up to 8.5 mm. Minimal prominence major intrahepatic biliary ducts without branch vessel dilation.  Enlarged liver spanning over 23 cm containing cysts. Splenomegaly spanning over 18.6 cm length. Portal vein and prominent splenic veins are patent.  Peripancreatic adenopathy with shotty intra-aortic caval lymph nodes of questionable etiology.  Small amount of free fluid in the pelvis may be related to the bile leak.  No definitive primary bowel inflammatory process.  Subcutaneous changes from surgery and STIR percutaneous catheter noted.  No worrisome pancreatic, adrenal or renal lesion.  Mild calcification lower abdominal aorta are consistent with slightly age advanced atherosclerotic type changes. No abdominal aortic aneurysm.  Degenerative changes lower thoracic and lumbar spine with scattered protrusion some which are calcified. Largest protrusion T10-11 level with mass effect upon the cord. This can be assessed with follow-up MR when patient is able. Spinal stenosis lower lumbar region. Facet joint degenerative changes.  Noncontrast filled views of the urinary bladder unremarkable. Post hysterectomy.  Bibasilar subsegmental  atelectatic changes greater on the right.  Ascending thoracic aorta measures 4.2 cm. Recommend annual imaging followup by CTA or MRA. This recommendation follows 2010  ACCF/AHA/AATS/ACR/ASA/SCA/SCAI/SIR/STS/SVM Guidelines for the Diagnosis and Management of Patients with Thoracic Aortic Disease. Circulation. 2010; 121: F643-P295.  IMPRESSION: Post cholecystectomy. Extending from the gallbladder fossa/operative site anteriorly and inferiorly is loculated 7.9 x 6.2 x 6.3 cm fluid and gas collection. This is consistent with bile leak. The presence of gas may reflect changes of an abscess as versus injected gas from stent placement. A percutaneous drain has been placed from the right. The tip of the drain is inferior and posterior to majority of the above described postoperative collection. Correlation with drainage amount recommended to determine if this needs to be repositioned.  Stent extends from the distal common bile duct through the pancreatic head into the second portion of the duodenum. Common bile duct measures up to 8.5 mm. Minimal prominence major intrahepatic biliary ducts without branch vessel dilation.  Enlarged liver spanning over 23 cm. Splenomegaly spanning over 18.6 cm length.  Peripancreatic adenopathy with shotty intra-aortic caval lymph nodes of questionable etiology.  Small amount of free fluid in the pelvis may be related to the bile leak.  Degenerative changes lower thoracic and lumbar spine with scattered protrusion some which are calcified. Largest protrusion T10-11 level with mass effect upon the cord. This can be assessed with follow-up MR when patient is able. Spinal stenosis lower lumbar region.  Ascending thoracic aorta measures 4.2 cm. Recommend annual imaging followup by CTA or MRA. This recommendation follows 2010 ACCF/AHA/AATS/ACR/ASA/SCA/SCAI/SIR/STS/SVM Guidelines for the Diagnosis and Management of Patients with Thoracic Aortic Disease. Circulation. 2010; 121: J884-Z660.   Electronically Signed   By: Genia Del M.D.   On: 07/22/2014 15:07   Ct Image Guided Drainage Percut Cath  Peritoneal Retroperit  07/23/2014   INDICATION: Postoperative  fluid collection in right upper quadrant of abdomen.  EXAM: CT CORE BIOPSY RENAL  COMPARISON:  CT scan of July 22, 2014.  MEDICATIONS: Fentanyl 150 mcg IV; Versed 3.0 mg IV  ANESTHESIA/SEDATION: Total Moderate Sedation Time  35 minutes  CONTRAST:  None.  COMPLICATIONS: None immediate.  TECHNIQUE: Informed written consent was obtained from [place], [place] after a discussion of the risks, benefits and alternatives to treatment. The patient was placed supine on the CT gantry and a pre procedural CT was performed re-demonstrating the known abscess/fluid collection within the subhepatic space. The procedure was planned. A timeout was performed prior to the initiation of the procedure.  The epigastric region was prepped and draped in the usual sterile fashion. The overlying soft tissues were anesthetized with 1% lidocaine with epinephrine. Appropriate trajectory was planned with the use of a 22 gauge spinal needle. An 18 gauge trocar needle was advanced into the abscess/fluid collection and a short Amplatz super stiff wire was coiled within the collection. Appropriate positioning was confirmed with a limited CT scan. The tract was serially dilated allowing placement of a 10 [place] all-purpose drainage catheter. Appropriate positioning was confirmed with a limited postprocedural CT scan.  Ten ml of serous fluid was aspirated. The tube was connected to a JP suction bulb and secured in place. A dressing was placed. The patient tolerated the procedure well without immediate post procedural complication.  IMPRESSION: Successful CT guided placement of a 10 French all purpose drain catheter into the subhepatic fluid collection with aspiration of 10 mL of serous fluid.   Electronically Signed   By: Marijo Conception, M.D.  On: 07/23/2014 12:08    Anti-infectives: Anti-infectives    None      Assessment/Plan: s/p    CT was personally reviewed I see no sign of ongoing biliary fistula at this point with the stent placed by  ERCP it has clearly D diminished or possibly even closed completely. She is doing well I will consider removing the JP drain tomorrow morning and consider discharge if the patient is feeling better tomorrow.Florene Glen, MD, FACS  07/24/2014

## 2014-07-24 NOTE — Care Management (Signed)
Discharge soon , no CM discharge needs anticipated.

## 2014-07-24 NOTE — Progress Notes (Signed)
Nutrition Follow-up  DOCUMENTATION CODES:      INTERVENTION:   Meals and snacks: Cater to pt preferences Nutrition Supplement Therapy: Will add mightyshake BID between meals for additional nutrition (each shake provides 300kcals and 9g protein) Nutrition diet education: Discussed high protein, bland foods for pt to consume.  Pt verbalized understanding and expect good compliance    NUTRITION DIAGNOSIS:   Inadequate oral intake related to altered GI function as evidenced by meal completion < 50%, improving as on solid foods and supplements   GOAL:   Patient will meet greater than or equal to 90% of their needs  Not meeting nutritional needs  MONITOR:    (Energy intake, Digestive system, Anthropometric)  REASON FOR ASSESSMENT:   Malnutrition Screening Tool    ASSESSMENT:    Pt s/p CT guided drainage yesterday of biliary fistula   Current Nutrition: eating fruit today during visit.  Overall intake has been decreased since admission (day 8). Family has been eating trays. Typically eating jello, fruit, popsciles, boost breeze.     Last BM: 7/21   Medications: reviewed  Electrolyte/Renal Profile and Glucose Profile:   Recent Labs Lab 07/19/14 0809 07/22/14 0409 07/23/14 0417 07/24/14 0420  NA 139 135 137 138  K 4.0 3.2* 3.5 3.7  CL 104 92* 95* 99*  CO2 28 31 33* 32  BUN <5* 10 9 9   CREATININE 0.57 0.70 0.65 0.75  CALCIUM 7.9* 8.7* 8.3* 8.0*  MG 1.6*  --   --   --   GLUCOSE 118* 126* 112* 122*   Protein Profile:  Recent Labs Lab 07/22/14 0409 07/23/14 0417 07/24/14 0420  ALBUMIN 3.4* 3.0* 3.1*     Weight Trend since Admission: Filed Weights   07/18/14 0100  Weight: 244 lb 6.4 oz (110.859 kg)      Diet Order:  Diet regular Room service appropriate?: Yes; Fluid consistency:: Thin  Skin:  Reviewed, no issues  Last BM:  7/10  Height:   Ht Readings from Last 1 Encounters:  07/18/14 5' 2"  (1.575 m)    Weight:   Wt Readings from Last  1 Encounters:  07/18/14 244 lb 6.4 oz (110.859 kg)    Ideal Body Weight:  50 kg  Wt Readings from Last 10 Encounters:  07/18/14 244 lb 6.4 oz (110.859 kg)  07/12/14 250 lb (113.399 kg)  06/23/14 248 lb (112.492 kg)  03/17/11 228 lb (103.42 kg)  01/07/11 243 lb (110.224 kg)    BMI:  Body mass index is 44.69 kg/(m^2).  Estimated Nutritional Needs:   Kcal:  Using IBW of 50kg (BEE 1068 kcals (IF 1.0-1.3, AF 1.3) 2336-1224 kcals/d  Protein:  Using IBW of 50kg (1.0-1.2 g/kg) 50-60 g/d  Fluid:  Using IBW of 50kg (25-78m/kg) 1250-15012m  EDUCATION NEEDS:   No education needs identified at this time  MODERATE Care Level  Lori Mckenzie B. AlZenia ResidesRDHarrisonLDSpartapager)

## 2014-07-24 NOTE — Care Management Note (Signed)
Case Management Note  Patient Details  Name: Lori Mckenzie MRN: [id number] Date of Birth: Jun 13, 1965  Subjective/Objective:                    Action/Plan:  No CM needs anticipated at time of discharge. Expected Discharge Date:                  Expected Discharge Plan:  Home/Self Care  In-House Referral:     Discharge planning Services  CM Consult  Post Acute Care Choice:    Choice offered to:  NA  DME Arranged:    DME Agency:     HH Arranged:  NA HH Agency:     Status of Service:     Medicare Important Message Given:    Date Medicare IM Given:    Medicare IM give by:    Date Additional Medicare IM Given:    Additional Medicare Important Message give by:     If discussed at [place] of Stay Meetings, dates discussed:    Additional Comments:  Alvie Heidelberg, RN 07/24/2014, 2:03 PM

## 2014-07-25 LAB — GLUCOSE, CAPILLARY
Glucose-Capillary: 158 mg/dL — ABNORMAL HIGH (ref 65–99)
Glucose-Capillary: 107 mg/dL — ABNORMAL HIGH (ref 65–99)
Glucose-Capillary: 136 mg/dL — ABNORMAL HIGH (ref 65–99)
Glucose-Capillary: 143 mg/dL — ABNORMAL HIGH (ref 65–99)

## 2014-07-25 NOTE — Progress Notes (Signed)
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 She has had drainage of a serous fluid collection in the epigastrium. Currently she is complaining mostly of drain output around the JP drain. She is tolerating a diet.  Objective: Vital signs in last 24 hours: Temp:  [98.6 F (37 C)-99.3 F (37.4 C)] 98.6 F (37 C) (07/22 0103) Pulse Rate:  [81-89] 89 (07/22 0609) Resp:  [17-18] 18 (07/21 2358) BP: (87-125)/(44-102) 125/102 mmHg (07/22 0609) SpO2:  [94 %-96 %] 96 % (07/22 0103) Last BM Date: 07/25/14  Intake/Output from previous day: 07/21 0701 - 07/22 0700 In: 2458 [P.O.:1440] Out: 2656 [Urine:2600; Drains:56] Intake/Output this shift:    Physical exam:  In general patient appears well and. Patient was eating her regular diet this morning and not examined. I will return later today and remove the left right lateral JP drain leaving the CT-guided drain in place for another day. Lab Results: CBC   Recent Labs  07/23/14 0417 07/24/14 0420  WBC 5.7 5.1  HGB 11.4* 10.9*  HCT 33.1* 32.1*  PLT 141* 135*   BMET  Recent Labs  07/23/14 0417 07/24/14 0420  NA 137 138  K 3.5 3.7  CL 95* 99*  CO2 33* 32  GLUCOSE 112* 122*  BUN 9 9  CREATININE 0.65 0.75  CALCIUM 8.3* 8.0*   PT/INR  Recent Labs  07/23/14 0417  LABPROT 16.0*  INR 1.26   ABG No results for input(s): PHART, HCO3 in the last 72 hours.  Invalid input(s): PCO2, PO2  Studies/Results: Ct Image Guided Drainage Percut Cath  Peritoneal Retroperit  07/23/2014   INDICATION: Postoperative fluid collection in right upper quadrant of abdomen.  EXAM: CT CORE BIOPSY RENAL  COMPARISON:  CT scan of July 22, 2014.  MEDICATIONS: Fentanyl 150 mcg IV; Versed 3.0 mg IV  ANESTHESIA/SEDATION: Total Moderate Sedation Time  35 minutes  CONTRAST:  None.  COMPLICATIONS: None immediate.  TECHNIQUE: Informed written consent was obtained from [place], Lori Mckenzie after a discussion  of the risks, benefits and alternatives to treatment. The patient was placed supine on the CT gantry and a pre procedural CT was performed re-demonstrating the known abscess/fluid collection within the subhepatic space. The procedure was planned. A timeout was performed prior to the initiation of the procedure.  The epigastric region was prepped and draped in the usual sterile fashion. The overlying soft tissues were anesthetized with 1% lidocaine with epinephrine. Appropriate trajectory was planned with the use of a 22 gauge spinal needle. An 18 gauge trocar needle was advanced into the abscess/fluid collection and a short Amplatz super stiff wire was coiled within the collection. Appropriate positioning was confirmed with a limited CT scan. The tract was serially dilated allowing placement of a 10 [place] all-purpose drainage catheter. Appropriate positioning was confirmed with a limited postprocedural CT scan.  Ten ml of serous fluid was aspirated. The tube was connected to a JP suction bulb and secured in place. A dressing was placed. The patient tolerated the procedure well without immediate post procedural complication.  IMPRESSION: Successful CT guided placement of a 10 French all purpose drain catheter into the subhepatic fluid collection with aspiration of 10 mL of serous fluid.   Electronically Signed   By: Marijo Conception, M.D.   On: 07/23/2014 12:08    Anti-infectives: Anti-infectives    None      Assessment/Plan: s/p    As above we'll likely remove the lateral drain today and leave  the CT-guided drain in place for 1 more day possibly home tomorrow.Florene Glen, MD, FACS  07/25/2014

## 2014-07-26 LAB — CBC WITH DIFFERENTIAL/PLATELET
Basophils Absolute: 0 10*3/uL (ref 0–0.1)
Basophils Relative: 0 %
Eosinophils Absolute: 0.2 10*3/uL (ref 0–0.7)
Eosinophils Relative: 2 %
HCT: 34.6 % — ABNORMAL LOW (ref 35.0–47.0)
Hemoglobin: 11.5 g/dL — ABNORMAL LOW (ref 12.0–16.0)
Lymphocytes Relative: 19 %
Lymphs Abs: 1.5 10*3/uL (ref 1.0–3.6)
MCH: 32 pg (ref 26.0–34.0)
MCHC: 33.3 g/dL (ref 32.0–36.0)
MCV: 96.2 fL (ref 80.0–100.0)
Monocytes Absolute: 0.6 10*3/uL (ref 0.2–0.9)
Monocytes Relative: 8 %
Neutro Abs: 5.5 10*3/uL (ref 1.4–6.5)
Neutrophils Relative %: 71 %
Platelets: 140 10*3/uL — ABNORMAL LOW (ref 150–440)
RBC: 3.6 MIL/uL — ABNORMAL LOW (ref 3.80–5.20)
RDW: 13.7 % (ref 11.5–14.5)
WBC: 7.8 10*3/uL (ref 3.6–11.0)

## 2014-07-26 LAB — COMPREHENSIVE METABOLIC PANEL
ALT: 12 U/L — ABNORMAL LOW (ref 14–54)
AST: 23 U/L (ref 15–41)
Albumin: 2.9 g/dL — ABNORMAL LOW (ref 3.5–5.0)
Alkaline Phosphatase: 59 U/L (ref 38–126)
Anion gap: 9 (ref 5–15)
BUN: 6 mg/dL (ref 6–20)
CO2: 29 mmol/L (ref 22–32)
Calcium: 8.3 mg/dL — ABNORMAL LOW (ref 8.9–10.3)
Chloride: 100 mmol/L — ABNORMAL LOW (ref 101–111)
Creatinine, Ser: 0.63 mg/dL (ref 0.44–1.00)
GFR calc Af Amer: >60 mL/min (ref >60)
GFR calc non Af Amer: >60 mL/min (ref >60)
Glucose, Bld: 136 mg/dL — ABNORMAL HIGH (ref 65–99)
Potassium: 4.1 mmol/L (ref 3.5–5.1)
Sodium: 138 mmol/L (ref 135–145)
Total Bilirubin: 0.5 mg/dL (ref 0.3–1.2)
Total Protein: 6.2 g/dL — ABNORMAL LOW (ref 6.5–8.1)

## 2014-07-26 LAB — GLUCOSE, CAPILLARY
Glucose-Capillary: 122 mg/dL — ABNORMAL HIGH (ref 65–99)
Glucose-Capillary: 140 mg/dL — ABNORMAL HIGH (ref 65–99)
Glucose-Capillary: 140 mg/dL — ABNORMAL HIGH (ref 65–99)
Glucose-Capillary: 136 mg/dL — ABNORMAL HIGH (ref 65–99)

## 2014-07-26 MED ORDER — ACETAMINOPHEN 325 MG PO TABS
650.0000 mg | ORAL_TABLET | Freq: Once | ORAL | Status: AC
  Administered 2014-07-26: 650 mg via ORAL
  Filled 2014-07-26: qty 2

## 2014-07-26 NOTE — Progress Notes (Signed)
  Subjective: Still with pain and still asking for Dilaudid no nausea or vomiting.  Objective: Vital signs in last 24 hours: Temp:  [98.6 F (37 C)-98.8 F (37.1 C)] 98.7 F (37.1 C) (07/22 2331) Pulse Rate:  [89-94] 94 (07/22 1814) Resp:  [16-18] 16 (07/22 2331) BP: (99-104)/(53-61) 104/61 mmHg (07/22 2331) SpO2:  [93 %-95 %] 93 % (07/23 0730) Last BM Date: 07/25/14  Intake/Output from previous day: 07/22 0701 - 07/23 0700 In: 510 [P.O.:480] Out: -  Intake/Output this shift:    Physical exam:  Patient appears tearful but otherwise well. There is no sign of scleral icterus or jaundice. Graft abdominal exam is soft and nontender no bile in either drain minimal output.  Lab Results: CBC   Recent Labs  07/24/14 0420 07/26/14 0708  WBC 5.1 7.8  HGB 10.9* 11.5*  HCT 32.1* 34.6*  PLT 135* PENDING   BMET  Recent Labs  07/24/14 0420 07/26/14 0708  NA 138 138  K 3.7 4.1  CL 99* 100*  CO2 32 29  GLUCOSE 122* 136*  BUN 9 6  CREATININE 0.75 0.63  CALCIUM 8.0* 8.3*   PT/INR No results for input(s): LABPROT, INR in the last 72 hours. ABG No results for input(s): PHART, HCO3 in the last 72 hours.  Invalid input(s): PCO2, PO2  Studies/Results: No results found.  Anti-infectives: Anti-infectives    None      Assessment/Plan: s/p    Both drains were removed at this time without difficulty. She does not want to go home today. I have encouraged her to utilize oral pain medications rather than the IV Dilaudid so that we can get her home soon she agreed to this plan. I will home tomorrow*  Florene Glen, MD, FACS  07/26/2014

## 2014-07-27 LAB — GLUCOSE, CAPILLARY
Glucose-Capillary: 136 mg/dL — ABNORMAL HIGH (ref 65–99)
Glucose-Capillary: 121 mg/dL — ABNORMAL HIGH (ref 65–99)
Glucose-Capillary: 112 mg/dL — ABNORMAL HIGH (ref 65–99)
Glucose-Capillary: 127 mg/dL — ABNORMAL HIGH (ref 65–99)

## 2014-07-27 MED ORDER — HYDROMORPHONE HCL 1 MG/ML IJ SOLN
0.5000 mg | Freq: Four times a day (QID) | INTRAMUSCULAR | Status: DC | PRN
  Administered 2014-07-27 (×2): 0.5 mg via INTRAVENOUS
  Filled 2014-07-27 (×2): qty 1

## 2014-07-27 MED ORDER — SODIUM CHLORIDE 0.9 % IV SOLN
Freq: Once | INTRAVENOUS | Status: AC
  Administered 2014-07-27: via INTRAVENOUS

## 2014-07-27 MED ORDER — OXYCODONE-ACETAMINOPHEN 5-325 MG PO TABS: 1.0000 | ORAL_TABLET | ORAL | Status: DC | PRN

## 2014-07-27 MED ORDER — KETOROLAC TROMETHAMINE 30 MG/ML IJ SOLN
30.0000 mg | Freq: Once | INTRAMUSCULAR | Status: AC
  Administered 2014-07-27: 30 mg via INTRAVENOUS
  Filled 2014-07-27: qty 1

## 2014-07-27 NOTE — Progress Notes (Signed)
Spoke with MD about pt bp and pain levels. MD orders to d/c dilaudid iv, hold robaxin, give 500cc NS bolus x 1 and 19m iv toradol x 1. Will continue to monitor.

## 2014-07-27 NOTE — Progress Notes (Signed)
  Subjective: Status post laparoscopic cholecystectomy with subsequent biliary fistula which was treated with ERCP stent and papillotomy. Patient continues to aspirate allotted dating that she really wants to go home but needs that allotted. Dates that she is afraid of the drainage which has completely stopped. (Her drain was removed yesterday). Husband does not want her discharge today.  Objective: Vital signs in last 24 hours: Temp:  [98.2 F (36.8 C)-100.9 F (38.3 C)] 98.4 F (36.9 C) (07/24 0732) Pulse Rate:  [75-100] 75 (07/24 0734) Resp:  [18-20] 20 (07/24 0732) BP: (79-104)/(48-61) 93/53 mmHg (07/24 0734) SpO2:  [93 %-95 %] 95 % (07/24 0732) Last BM Date: 07/26/14  Intake/Output from previous day: 07/23 0701 - 07/24 0700 In: 720 [P.O.:720] Out: -  Intake/Output this shift:    Physical exam:  No scleral icterus no jaundice abdomen soft nontender wounds are clean no erythema no drainage at all at this point  Lab Results: CBC   Recent Labs  07/26/14 0708  WBC 7.8  HGB 11.5*  HCT 34.6*  PLT 140*   BMET  Recent Labs  07/26/14 0708  NA 138  K 4.1  CL 100*  CO2 29  GLUCOSE 136*  BUN 6  CREATININE 0.63  CALCIUM 8.3*   PT/INR No results for input(s): LABPROT, INR in the last 72 hours. ABG No results for input(s): PHART, HCO3 in the last 72 hours.  Invalid input(s): PCO2, PO2  Studies/Results: No results found.  Anti-infectives: Anti-infectives    None      Assessment/Plan: s/p    Patient family essentially refusing discharge at this point although other than her attendance on Dilaudid she should be able to go home. I discussed with nursing and they stated that she went all day with just Percocet but asked for Dilaudid at night. She asked me for Dilaudid this morning in spite of her abdomen being essentially nontender. I are again reviewed for her as I did yesterday the need to transition to oral pain medications as opposed to the IV Dilaudid.  Husband does not want her to be discharged at this point.  I have prepared discharge paperwork including prescriptions and will again attempt to get her discharge tomorrow. This is discussed with nursing and family.Florene Glen, MD, FACS  07/27/2014

## 2014-07-27 NOTE — Discharge Instructions (Signed)
°  May shower  Resume all home medications. Follow-up with Dr. Marina Gravel in 14 days.

## 2014-07-27 NOTE — Progress Notes (Signed)
Notified Dr. Burt Knack that patient had concerns that she may have an infection in her abdomen or that she my need to have fluid drained. Dr. Burt Knack said that he explained to the patient that he believed that everything was normal and that he would not be able to stop back by this afternoon. When the nurse went back in to speak with the pt she had squeezed some puss out of one of her incisions.

## 2014-07-27 NOTE — Progress Notes (Signed)
Hold lasix, spironolactone and HCTZ per Dr. Burt Knack for low BP

## 2014-07-28 LAB — GLUCOSE, CAPILLARY
Glucose-Capillary: 120 mg/dL — ABNORMAL HIGH (ref 65–99)
Glucose-Capillary: 120 mg/dL — ABNORMAL HIGH (ref 65–99)
Glucose-Capillary: 128 mg/dL — ABNORMAL HIGH (ref 65–99)
Glucose-Capillary: 122 mg/dL — ABNORMAL HIGH (ref 65–99)

## 2014-07-28 MED ORDER — OXYCODONE HCL 5 MG PO TABS: 5.0000 mg | ORAL_TABLET | ORAL | Status: DC | PRN

## 2014-07-28 MED ORDER — OXYCODONE-ACETAMINOPHEN 5-325 MG PO TABS: 1.0000 | ORAL_TABLET | ORAL | Status: DC | PRN

## 2014-07-28 MED ORDER — HYDROMORPHONE HCL 1 MG/ML IJ SOLN
0.5000 mg | Freq: Once | INTRAMUSCULAR | Status: AC
  Administered 2014-07-28: 0.5 mg via INTRAVENOUS
  Filled 2014-07-28: qty 1

## 2014-07-28 MED ORDER — IPRATROPIUM BROMIDE 0.02 % IN SOLN: 2.5000 mL | Freq: Four times a day (QID) | RESPIRATORY_TRACT | Status: DC | PRN

## 2014-07-28 MED ORDER — HYDROMORPHONE HCL 2 MG PO TABS
1.0000 mg | ORAL_TABLET | ORAL | Status: DC | PRN
  Administered 2014-07-28: 1 mg via ORAL
  Administered 2014-07-28: 2 mg via ORAL
  Administered 2014-07-28: 1 mg via ORAL
  Administered 2014-07-29 (×2): 2 mg via ORAL
  Filled 2014-07-28 (×5): qty 1

## 2014-07-28 NOTE — Progress Notes (Signed)
Surgery Progress Note  S: No acute issues. O: AF/VSS, good uop GEN: NAD/A&Ox3 ABD: soft, min tender, nondistended  A/P 49 yo s/p lap chole, s/p ERCP and stent - pain control

## 2014-07-29 LAB — GLUCOSE, CAPILLARY
Glucose-Capillary: 114 mg/dL — ABNORMAL HIGH (ref 65–99)
Glucose-Capillary: 128 mg/dL — ABNORMAL HIGH (ref 65–99)
Glucose-Capillary: 140 mg/dL — ABNORMAL HIGH (ref 65–99)
Glucose-Capillary: 149 mg/dL — ABNORMAL HIGH (ref 65–99)

## 2014-07-29 MED ORDER — HYDROMORPHONE HCL 2 MG PO TABS
1.0000 mg | ORAL_TABLET | ORAL | Status: DC | PRN
  Administered 2014-07-29 – 2014-07-30 (×3): 1 mg via ORAL
  Filled 2014-07-29 (×3): qty 1

## 2014-07-29 MED ORDER — IPRATROPIUM BROMIDE 0.02 % IN SOLN
2.5000 mL | Freq: Four times a day (QID) | RESPIRATORY_TRACT | Status: DC
  Administered 2014-07-29 – 2014-07-30 (×4): 0.5 mg via RESPIRATORY_TRACT
  Filled 2014-07-29 (×5): qty 2.5

## 2014-07-29 NOTE — Progress Notes (Signed)
Surgery Progress Note  S: Pain improved significantly O: Blood pressure 94/62, pulse 76, temperature 98.5 F (36.9 C), temperature source Oral, resp. rate 16, height 5' 2"  (1.575 m), weight 110.859 kg (244 lb 6.4 oz), SpO2 92 %. GEN: NAD/A&Ox3 ABD: soft, nontender, nondistended  A/P 49 yo s/p lap chole, s/p ERCP with stent - cont pain control - possibly home tomorrow

## 2014-07-29 NOTE — Progress Notes (Signed)
Notified Dr. Rexene Edison of low BP hold spironolactone and hydrochlorathiazide. Give scheduled dose of lasix.

## 2014-07-29 NOTE — Care Management (Signed)
No consult. Case discussed at [place] today. Discussed with Dr Rexene Edison who stated possible discharge in the morning if pain controlled , BP, OK and tolerating medications.

## 2014-07-29 NOTE — Progress Notes (Signed)
Nutrition Follow-up     INTERVENTION:   Meals and snacks: cater to pt preferences Nutrition Supplement Therapy: Continue boost breeze and mightyshake (changed to chocolate) for added nutrition  NUTRITION DIAGNOSIS:   Inadequate oral intake related to altered GI function as evidenced by meal completion < 50% , improving    GOAL:   Patient will meet greater than or equal to 90% of their needs  Meeting nutritional needs  MONITOR:    (Energy intake, Digestive system, Anthropometric)  REASON FOR ASSESSMENT:   Malnutrition Screening Tool    ASSESSMENT:       Current Nutrition: eating 100% of meals per I and Osheet.  Pt reports eating more and drinking boost breeze but does not like vanilla mightyshake    Last BM: 7/25   Medications: reviewed  Electrolyte/Renal Profile and Glucose Profile:   Recent Labs Lab 07/23/14 0417 07/24/14 0420 07/26/14 0708  NA 137 138 138  K 3.5 3.7 4.1  CL 95* 99* 100*  CO2 33* 32 29  BUN 9 9 6   CREATININE 0.65 0.75 0.63  CALCIUM 8.3* 8.0* 8.3*  GLUCOSE 112* 122* 136*   Protein Profile:  Recent Labs Lab 07/23/14 0417 07/24/14 0420 07/26/14 0708  ALBUMIN 3.0* 3.1* 2.9*        Weight Trend since Admission: Filed Weights   07/18/14 0100  Weight: 244 lb 6.4 oz (110.859 kg)      Diet Order:  Diet regular Room service appropriate?: Yes; Fluid consistency:: Thin  Skin:  Reviewed, no issues   Height:   Ht Readings from Last 1 Encounters:  07/18/14 5' 2"  (1.575 m)    Weight:   Wt Readings from Last 1 Encounters:  07/18/14 244 lb 6.4 oz (110.859 kg)    Ideal Body Weight:  50 kg  Wt Readings from Last 10 Encounters:  07/18/14 244 lb 6.4 oz (110.859 kg)  07/12/14 250 lb (113.399 kg)  06/23/14 248 lb (112.492 kg)  03/17/11 228 lb (103.42 kg)  01/07/11 243 lb (110.224 kg)    BMI:  Body mass index is 44.69 kg/(m^2).  Estimated Nutritional Needs:   Kcal:  Using IBW of 50kg (BEE 1068 kcals (IF 1.0-1.3,  AF 1.3) 8757-9728 kcals/d  Protein:  Using IBW of 50kg (1.0-1.2 g/kg) 50-60 g/d  Fluid:  Using IBW of 50kg (25-78m/kg) 1250-15065m  EDUCATION NEEDS:   No education needs identified at this time  LOW Care Level  Toussaint Golson B. AlZenia ResidesRDMonson CenterLDMercersburgpager)

## 2014-07-30 LAB — GLUCOSE, CAPILLARY
Glucose-Capillary: 108 mg/dL — ABNORMAL HIGH (ref 65–99)
Glucose-Capillary: 131 mg/dL — ABNORMAL HIGH (ref 65–99)

## 2014-07-30 MED ORDER — SUMATRIPTAN SUCCINATE 25 MG PO TABS
25.0000 mg | ORAL_TABLET | ORAL | Status: DC | PRN
  Administered 2014-07-30: 25 mg via ORAL
  Filled 2014-07-30 (×3): qty 1

## 2014-07-30 MED ORDER — HYDROMORPHONE HCL 2 MG PO TABS: 1.0000 mg | ORAL_TABLET | Freq: Four times a day (QID) | ORAL | Status: DC | PRN

## 2014-07-30 MED ORDER — HYDROMORPHONE HCL 2 MG PO TABS
1.0000 mg | ORAL_TABLET | Freq: Four times a day (QID) | ORAL | Status: DC | PRN
  Administered 2014-07-30: 1 mg via ORAL
  Filled 2014-07-30: qty 1

## 2014-07-30 NOTE — Progress Notes (Signed)
MD called to obtain Discharge order. Per MD ok to discharge pt

## 2014-07-30 NOTE — Progress Notes (Signed)
Received discharge order per MD. IV removed. Pt given discharge packet, instructions reviewed and all questions were answered. Gave pt prescription for dilaudid. Taken out in wheelchair by volunteers.

## 2014-07-30 NOTE — Progress Notes (Signed)
Surgery Progress Note  S: Doing well.  Looks much better. O:Blood pressure 101/52, pulse 92, temperature 98.7 F (37.1 C), temperature source Oral, resp. rate 16, height 5' 2"  (1.575 m), weight 110.859 kg (244 lb 6.4 oz), SpO2 92 %. GEN: NAD/A&ox3 ABD: soft, min tender, nondistended  A/P 49 yo s/p lap chole s/p ERCP for bile leak - possible home later today

## 2014-08-03 ENCOUNTER — Encounter: Payer: Self-pay | Admitting: *Deleted

## 2014-08-03 ENCOUNTER — Emergency Department: Payer: Medicaid Other

## 2014-08-03 ENCOUNTER — Inpatient Hospital Stay
Admission: EM | Admit: 2014-08-03 | Discharge: 2014-08-08 | DRG: 857 | Disposition: A | Discharge status: Home / Self Care | Payer: Medicaid Other | Attending: Surgery | Admitting: Surgery

## 2014-08-03 DIAGNOSIS — Z8249 Family history of ischemic heart disease and other diseases of the circulatory system: Secondary | ICD-10-CM | POA: Diagnosis not present

## 2014-08-03 DIAGNOSIS — F419 Anxiety disorder, unspecified: Secondary | ICD-10-CM | POA: Diagnosis present

## 2014-08-03 DIAGNOSIS — J45909 Unspecified asthma, uncomplicated: Secondary | ICD-10-CM | POA: Diagnosis present

## 2014-08-03 DIAGNOSIS — E039 Hypothyroidism, unspecified: Secondary | ICD-10-CM | POA: Diagnosis present

## 2014-08-03 DIAGNOSIS — R1084 Generalized abdominal pain: Secondary | ICD-10-CM

## 2014-08-03 DIAGNOSIS — G43909 Migraine, unspecified, not intractable, without status migrainosus: Secondary | ICD-10-CM | POA: Diagnosis present

## 2014-08-03 DIAGNOSIS — K651 Peritoneal abscess: Secondary | ICD-10-CM | POA: Diagnosis present

## 2014-08-03 DIAGNOSIS — R509 Fever, unspecified: Secondary | ICD-10-CM

## 2014-08-03 DIAGNOSIS — B961 Klebsiella pneumoniae [K. pneumoniae] as the cause of diseases classified elsewhere: Secondary | ICD-10-CM | POA: Diagnosis present

## 2014-08-03 DIAGNOSIS — Z794 Long term (current) use of insulin: Secondary | ICD-10-CM | POA: Diagnosis not present

## 2014-08-03 DIAGNOSIS — F341 Dysthymic disorder: Secondary | ICD-10-CM | POA: Diagnosis present

## 2014-08-03 DIAGNOSIS — Z79899 Other long term (current) drug therapy: Secondary | ICD-10-CM | POA: Diagnosis not present

## 2014-08-03 DIAGNOSIS — R6520 Severe sepsis without septic shock: Secondary | ICD-10-CM | POA: Diagnosis not present

## 2014-08-03 DIAGNOSIS — K219 Gastro-esophageal reflux disease without esophagitis: Secondary | ICD-10-CM | POA: Diagnosis present

## 2014-08-03 DIAGNOSIS — T814XXA Infection following a procedure, initial encounter: Secondary | ICD-10-CM | POA: Diagnosis present

## 2014-08-03 DIAGNOSIS — R52 Pain, unspecified: Secondary | ICD-10-CM | POA: Diagnosis not present

## 2014-08-03 DIAGNOSIS — J449 Chronic obstructive pulmonary disease, unspecified: Secondary | ICD-10-CM | POA: Diagnosis present

## 2014-08-03 DIAGNOSIS — I878 Other specified disorders of veins: Secondary | ICD-10-CM | POA: Diagnosis not present

## 2014-08-03 DIAGNOSIS — N189 Chronic kidney disease, unspecified: Secondary | ICD-10-CM | POA: Diagnosis present

## 2014-08-03 DIAGNOSIS — D62 Acute posthemorrhagic anemia: Secondary | ICD-10-CM | POA: Diagnosis present

## 2014-08-03 DIAGNOSIS — Z801 Family history of malignant neoplasm of trachea, bronchus and lung: Secondary | ICD-10-CM | POA: Diagnosis not present

## 2014-08-03 DIAGNOSIS — I129 Hypertensive chronic kidney disease with stage 1 through stage 4 chronic kidney disease, or unspecified chronic kidney disease: Secondary | ICD-10-CM | POA: Diagnosis present

## 2014-08-03 DIAGNOSIS — A419 Sepsis, unspecified organism: Secondary | ICD-10-CM | POA: Diagnosis not present

## 2014-08-03 DIAGNOSIS — G4700 Insomnia, unspecified: Secondary | ICD-10-CM | POA: Diagnosis present

## 2014-08-03 DIAGNOSIS — I959 Hypotension, unspecified: Secondary | ICD-10-CM | POA: Diagnosis not present

## 2014-08-03 DIAGNOSIS — E119 Type 2 diabetes mellitus without complications: Secondary | ICD-10-CM | POA: Diagnosis present

## 2014-08-03 DIAGNOSIS — L0291 Cutaneous abscess, unspecified: Secondary | ICD-10-CM

## 2014-08-03 DIAGNOSIS — Z9981 Dependence on supplemental oxygen: Secondary | ICD-10-CM | POA: Diagnosis not present

## 2014-08-03 DIAGNOSIS — K810 Acute cholecystitis: Secondary | ICD-10-CM | POA: Diagnosis present

## 2014-08-03 DIAGNOSIS — E876 Hypokalemia: Secondary | ICD-10-CM | POA: Diagnosis not present

## 2014-08-03 DIAGNOSIS — Z8673 Personal history of transient ischemic attack (TIA), and cerebral infarction without residual deficits: Secondary | ICD-10-CM | POA: Diagnosis not present

## 2014-08-03 DIAGNOSIS — K7581 Nonalcoholic steatohepatitis (NASH): Secondary | ICD-10-CM | POA: Diagnosis present

## 2014-08-03 DIAGNOSIS — Z9049 Acquired absence of other specified parts of digestive tract: Secondary | ICD-10-CM | POA: Diagnosis present

## 2014-08-03 DIAGNOSIS — Z79891 Long term (current) use of opiate analgesic: Secondary | ICD-10-CM

## 2014-08-03 DIAGNOSIS — R109 Unspecified abdominal pain: Secondary | ICD-10-CM

## 2014-08-03 DIAGNOSIS — Z886 Allergy status to analgesic agent status: Secondary | ICD-10-CM | POA: Diagnosis not present

## 2014-08-03 DIAGNOSIS — Z452 Encounter for adjustment and management of vascular access device: Secondary | ICD-10-CM

## 2014-08-03 DIAGNOSIS — G4733 Obstructive sleep apnea (adult) (pediatric): Secondary | ICD-10-CM | POA: Diagnosis present

## 2014-08-03 DIAGNOSIS — Z6841 Body Mass Index (BMI) 40.0 and over, adult: Secondary | ICD-10-CM

## 2014-08-03 DIAGNOSIS — Z659 Problem related to unspecified psychosocial circumstances: Secondary | ICD-10-CM

## 2014-08-03 HISTORY — DX: Systemic involvement of connective tissue, unspecified: M35.9

## 2014-08-03 HISTORY — DX: Malignant (primary) neoplasm, unspecified: C80.1

## 2014-08-03 LAB — LIPASE, BLOOD: Lipase: 23 U/L (ref 22–51)

## 2014-08-03 LAB — GLUCOSE, CAPILLARY
Glucose-Capillary: 133 mg/dL — ABNORMAL HIGH (ref 65–99)
Glucose-Capillary: 154 mg/dL — ABNORMAL HIGH (ref 65–99)

## 2014-08-03 LAB — COMPREHENSIVE METABOLIC PANEL
ALT: 13 U/L — ABNORMAL LOW (ref 14–54)
AST: 26 U/L (ref 15–41)
Albumin: 2.9 g/dL — ABNORMAL LOW (ref 3.5–5.0)
Alkaline Phosphatase: 59 U/L (ref 38–126)
Anion gap: 11 (ref 5–15)
BUN: 10 mg/dL (ref 6–20)
CO2: 32 mmol/L (ref 22–32)
Calcium: 8.3 mg/dL — ABNORMAL LOW (ref 8.9–10.3)
Chloride: 94 mmol/L — ABNORMAL LOW (ref 101–111)
Creatinine, Ser: 0.65 mg/dL (ref 0.44–1.00)
GFR calc Af Amer: >60 mL/min (ref >60)
GFR calc non Af Amer: >60 mL/min (ref >60)
Glucose, Bld: 166 mg/dL — ABNORMAL HIGH (ref 65–99)
Potassium: 3.5 mmol/L (ref 3.5–5.1)
Sodium: 137 mmol/L (ref 135–145)
Total Bilirubin: 0.9 mg/dL (ref 0.3–1.2)
Total Protein: 7.3 g/dL (ref 6.5–8.1)

## 2014-08-03 LAB — URINALYSIS COMPLETE WITH MICROSCOPIC (ARMC ONLY)
Bacteria, UA: NONE SEEN
Glucose, UA: NEGATIVE mg/dL
Hgb urine dipstick: NEGATIVE
Ketones, ur: NEGATIVE mg/dL
Leukocytes, UA: NEGATIVE
Nitrite: NEGATIVE
Protein, ur: 30 mg/dL — AB
Specific Gravity, Urine: 1.027 (ref 1.005–1.030)
pH: 7 (ref 5.0–8.0)

## 2014-08-03 LAB — CBC
HCT: 32.3 % — ABNORMAL LOW (ref 35.0–47.0)
Hemoglobin: 10.6 g/dL — ABNORMAL LOW (ref 12.0–16.0)
MCH: 30.8 pg (ref 26.0–34.0)
MCHC: 32.8 g/dL (ref 32.0–36.0)
MCV: 94.1 fL (ref 80.0–100.0)
Platelets: 192 10*3/uL (ref 150–440)
RBC: 3.44 MIL/uL — ABNORMAL LOW (ref 3.80–5.20)
RDW: 13.8 % (ref 11.5–14.5)
WBC: 9.7 10*3/uL (ref 3.6–11.0)

## 2014-08-03 MED ORDER — PANTOPRAZOLE SODIUM 40 MG IV SOLR
40.0000 mg | Freq: Every day | INTRAVENOUS | Status: DC
  Administered 2014-08-03 – 2014-08-04 (×2): 40 mg via INTRAVENOUS
  Filled 2014-08-03 (×3): qty 40

## 2014-08-03 MED ORDER — DIPHENHYDRAMINE HCL 50 MG/ML IJ SOLN: 12.5000 mg | Freq: Four times a day (QID) | INTRAMUSCULAR | Status: DC | PRN

## 2014-08-03 MED ORDER — NALOXONE HCL 0.4 MG/ML IJ SOLN: 0.4000 mg | INTRAMUSCULAR | Status: DC | PRN

## 2014-08-03 MED ORDER — SODIUM CHLORIDE 0.9 % IJ SOLN: 9.0000 mL | INTRAMUSCULAR | Status: DC | PRN

## 2014-08-03 MED ORDER — SODIUM CHLORIDE 0.9 % IV SOLN
INTRAVENOUS | Status: DC
  Administered 2014-08-03 – 2014-08-05 (×5): via INTRAVENOUS

## 2014-08-03 MED ORDER — ONDANSETRON HCL 4 MG/2ML IJ SOLN
4.0000 mg | Freq: Once | INTRAMUSCULAR | Status: AC
  Administered 2014-08-03: 4 mg via INTRAVENOUS
  Filled 2014-08-03: qty 2

## 2014-08-03 MED ORDER — MOMETASONE FURO-FORMOTEROL FUM 100-5 MCG/ACT IN AERO
2.0000 | INHALATION_SPRAY | Freq: Two times a day (BID) | RESPIRATORY_TRACT | Status: DC
  Administered 2014-08-03 – 2014-08-08 (×10): 2 via RESPIRATORY_TRACT
  Filled 2014-08-03: qty 8.8

## 2014-08-03 MED ORDER — FLUTICASONE PROPIONATE 50 MCG/ACT NA SUSP
1.0000 | Freq: Every day | NASAL | Status: DC | PRN
  Filled 2014-08-03: qty 16

## 2014-08-03 MED ORDER — CITALOPRAM HYDROBROMIDE 20 MG PO TABS
20.0000 mg | ORAL_TABLET | Freq: Every day | ORAL | Status: DC
  Administered 2014-08-05 – 2014-08-08 (×4): 20 mg via ORAL
  Filled 2014-08-03 (×4): qty 1

## 2014-08-03 MED ORDER — GABAPENTIN 300 MG PO CAPS
300.0000 mg | ORAL_CAPSULE | Freq: Four times a day (QID) | ORAL | Status: DC | PRN
  Administered 2014-08-07: 300 mg via ORAL
  Filled 2014-08-03: qty 1

## 2014-08-03 MED ORDER — HYDROMORPHONE HCL 1 MG/ML IJ SOLN
1.0000 mg | Freq: Once | INTRAMUSCULAR | Status: AC
  Administered 2014-08-03: 1 mg via INTRAVENOUS
  Filled 2014-08-03: qty 1

## 2014-08-03 MED ORDER — IOHEXOL 300 MG/ML  SOLN
100.0000 mL | Freq: Once | INTRAMUSCULAR | Status: AC | PRN
  Administered 2014-08-03: 100 mL via INTRAVENOUS

## 2014-08-03 MED ORDER — HYDROCHLOROTHIAZIDE 25 MG PO TABS: 25.0000 mg | ORAL_TABLET | Freq: Every day | ORAL | Status: DC

## 2014-08-03 MED ORDER — ACETAMINOPHEN 325 MG PO TABS
650.0000 mg | ORAL_TABLET | Freq: Four times a day (QID) | ORAL | Status: DC | PRN
  Administered 2014-08-03 – 2014-08-06 (×4): 650 mg via ORAL
  Filled 2014-08-03 (×4): qty 2

## 2014-08-03 MED ORDER — ONDANSETRON HCL 4 MG/2ML IJ SOLN: 4.0000 mg | Freq: Four times a day (QID) | INTRAMUSCULAR | Status: DC | PRN

## 2014-08-03 MED ORDER — FUROSEMIDE 40 MG PO TABS: 80.0000 mg | ORAL_TABLET | Freq: Every day | ORAL | Status: DC

## 2014-08-03 MED ORDER — ALPRAZOLAM 1 MG PO TABS
1.0000 mg | ORAL_TABLET | Freq: Three times a day (TID) | ORAL | Status: DC | PRN
  Administered 2014-08-05 – 2014-08-08 (×5): 1 mg via ORAL
  Filled 2014-08-03 (×5): qty 1

## 2014-08-03 MED ORDER — ACETAMINOPHEN 650 MG RE SUPP: 650.0000 mg | Freq: Four times a day (QID) | RECTAL | Status: DC | PRN

## 2014-08-03 MED ORDER — CLOTRIMAZOLE 10 MG MT TROC
10.0000 mg | Freq: Four times a day (QID) | OROMUCOSAL | Status: DC | PRN
  Administered 2014-08-06: 10 mg via ORAL
  Filled 2014-08-03 (×2): qty 1

## 2014-08-03 MED ORDER — ALBUTEROL SULFATE (2.5 MG/3ML) 0.083% IN NEBU: 2.5000 mg | INHALATION_SOLUTION | RESPIRATORY_TRACT | Status: DC | PRN

## 2014-08-03 MED ORDER — INSULIN ASPART 100 UNIT/ML ~~LOC~~ SOLN
0.0000 [IU] | SUBCUTANEOUS | Status: DC
  Administered 2014-08-03 – 2014-08-04 (×2): 2 [IU] via SUBCUTANEOUS
  Administered 2014-08-04 – 2014-08-06 (×6): 1 [IU] via SUBCUTANEOUS
  Administered 2014-08-07: 2 [IU] via SUBCUTANEOUS
  Administered 2014-08-07: 1 [IU] via SUBCUTANEOUS
  Filled 2014-08-03: qty 1
  Filled 2014-08-03: qty 2
  Filled 2014-08-03 (×2): qty 1
  Filled 2014-08-03: qty 2
  Filled 2014-08-03 (×2): qty 1
  Filled 2014-08-03: qty 2
  Filled 2014-08-03 (×2): qty 1

## 2014-08-03 MED ORDER — HYDROMORPHONE 0.3 MG/ML IV SOLN
INTRAVENOUS | Status: DC
  Administered 2014-08-03: 19:00:00 via INTRAVENOUS
  Administered 2014-08-04: 2.19 mg via INTRAVENOUS
  Administered 2014-08-04: 0.3 mg via INTRAVENOUS
  Administered 2014-08-04: 1.39 mL via INTRAVENOUS
  Administered 2014-08-04: 1.19 mg via INTRAVENOUS
  Administered 2014-08-04: 5.39 mL via INTRAVENOUS
  Administered 2014-08-04: 3.39 mg via INTRAVENOUS
  Administered 2014-08-04: 2.19 mg via INTRAVENOUS
  Filled 2014-08-03 (×3): qty 25

## 2014-08-03 MED ORDER — SODIUM CHLORIDE 0.9 % IV BOLUS (SEPSIS)
1000.0000 mL | Freq: Once | INTRAVENOUS | Status: AC
  Administered 2014-08-03: 1000 mL via INTRAVENOUS
  Filled 2014-08-03: qty 1000

## 2014-08-03 MED ORDER — IPRATROPIUM-ALBUTEROL 0.5-2.5 (3) MG/3ML IN SOLN
3.0000 mL | Freq: Four times a day (QID) | RESPIRATORY_TRACT | Status: DC
  Administered 2014-08-03 – 2014-08-04 (×5): 3 mL via RESPIRATORY_TRACT
  Filled 2014-08-03 (×5): qty 3

## 2014-08-03 MED ORDER — DIPHENHYDRAMINE HCL 12.5 MG/5ML PO ELIX
12.5000 mg | ORAL_SOLUTION | Freq: Four times a day (QID) | ORAL | Status: DC | PRN
  Filled 2014-08-03: qty 5

## 2014-08-03 MED ORDER — PIPERACILLIN-TAZOBACTAM 3.375 G IVPB
3.3750 g | Freq: Three times a day (TID) | INTRAVENOUS | Status: DC
  Administered 2014-08-03 – 2014-08-05 (×5): 3.375 g via INTRAVENOUS
  Filled 2014-08-03 (×10): qty 50

## 2014-08-03 MED ORDER — IPRATROPIUM BROMIDE 0.02 % IN SOLN
0.5000 mg | Freq: Three times a day (TID) | RESPIRATORY_TRACT | Status: DC
  Administered 2014-08-04: 0.5 mg via RESPIRATORY_TRACT
  Filled 2014-08-03: qty 2.5

## 2014-08-03 MED ORDER — ONDANSETRON HCL 4 MG/2ML IJ SOLN
4.0000 mg | Freq: Four times a day (QID) | INTRAMUSCULAR | Status: DC | PRN
  Administered 2014-08-03: 4 mg via INTRAVENOUS
  Filled 2014-08-03: qty 2

## 2014-08-03 MED ORDER — IOHEXOL 240 MG/ML SOLN
25.0000 mL | INTRAMUSCULAR | Status: AC
  Administered 2014-08-03: 50 mL via ORAL

## 2014-08-03 NOTE — ED Notes (Signed)
Pt wanting more pain meds, but resting with eyes closed.

## 2014-08-03 NOTE — ED Notes (Signed)
Pt in from home, pt states she has been having fevers. Pt states she has nausea and vomiting and severe abd pain. Pt states surgery was on the 12th or the 13th was discharged last Sunday

## 2014-08-03 NOTE — ED Provider Notes (Signed)
[facility] Emergency Department Provider Note   ____________________________________________  Time seen: 2 PM I have reviewed the triage vital signs and the triage nursing note.  HISTORY  Chief Complaint Fever; Nausea; and Abdominal Pain   Historian Patient and husband  HPI Lori Mckenzie is a 48 y.o. female who 2 weeks ago had a gallbladder removal and has had complications since then including abscess formation and drain placement per the husband's description. She has no drains currently. She went home from hospital 3 days ago. States she had a fever of 100.9 yesterday. She's been having increasing pain at home. No vomiting. No bowel problems. No shortness of breath or chest pain.    Past Medical History  Diagnosis Date  . Brittle bone disease   . Asthma   . Thyroid disease   . Hypertension   . Back pain   . Diabetes mellitus without complication   . Chronic kidney disease   . Anxiety   . GERD (gastroesophageal reflux disease)   . Sleep apnea   . COPD (chronic obstructive pulmonary disease)   . Hypothyroidism   . Cervical disc disease   . TIA (transient ischemic attack)     Patient Active Problem List   Diagnosis Date Noted  . Bile leak, postoperative 07/17/2014  . Steatohepatitis 07/15/2014  . Cholecystitis   . Biliary colic 71/69/6789  . Abdominal pain   . Type 2 diabetes mellitus 07/12/2014  . COPD (chronic obstructive pulmonary disease) 07/12/2014  . HTN (hypertension) 07/12/2014  . GERD (gastroesophageal reflux disease) 07/12/2014  . OSA on CPAP 07/12/2014  . Anxiety 07/12/2014    Past Surgical History  Procedure Laterality Date  . Abdominal hysterectomy    . Tubal ligation    . Colonoscopy with propofol N/A 06/23/2014    Procedure: COLONOSCOPY WITH PROPOFOL;  Surgeon: Lollie Sails, MD;  Location: [facility] ENDOSCOPY;  Service: Endoscopy;  Laterality: N/A;  . Esophagogastroduodenoscopy N/A 06/23/2014    Procedure:  ESOPHAGOGASTRODUODENOSCOPY (EGD);  Surgeon: Lollie Sails, MD;  Location: [facility] ENDOSCOPY;  Service: Endoscopy;  Laterality: N/A;  . Cholecystectomy N/A 07/15/2014    Procedure: LAPAROSCOPIC CHOLECYSTECTOMY with liver biopsy ;  Surgeon: Sherri Rad, MD;  Location: [facility] ORS;  Service: General;  Laterality: N/A;  . Ercp N/A 07/16/2014    Procedure: ENDOSCOPIC RETROGRADE CHOLANGIOPANCREATOGRAPHY (ERCP);  Surgeon: Clarene Essex, MD;  Location: [facility] ENDOSCOPY;  Service: Endoscopy;  Laterality: N/A;    Current Outpatient Rx  Name  Route  Sig  Dispense  Refill  . albuterol (PROVENTIL HFA;VENTOLIN HFA) 108 (90 BASE) MCG/ACT inhaler   Inhalation   Inhale 2 puffs into the lungs every 4 (four) hours as needed for wheezing or shortness of breath.         . alprazolam (XANAX) 2 MG tablet   Oral   Take 2 mg by mouth 3 (three) times daily as needed for anxiety.          . citalopram (CELEXA) 20 MG tablet   Oral   Take 20 mg by mouth daily.         . clotrimazole (MYCELEX) 10 MG troche   Oral   Take 10 mg by mouth 4 (four) times daily as needed (mouth dryness).         . fluticasone (FLONASE) 50 MCG/ACT nasal spray   Each Nare   Place 1 spray into both nostrils daily as needed (dryness).          . Fluticasone-Salmeterol (ADVAIR) 100-50 MCG/DOSE  AEPB   Inhalation   Inhale 1 puff into the lungs 2 (two) times daily.         . furosemide (LASIX) 40 MG tablet   Oral   Take 80 mg by mouth daily.         Marland Kitchen gabapentin (NEURONTIN) 100 MG capsule   Oral   Take 300 mg by mouth 4 (four) times daily as needed (pain).         . hydrochlorothiazide (HYDRODIURIL) 25 MG tablet   Oral   Take 25 mg by mouth daily.         Marland Kitchen HYDROmorphone (DILAUDID) 2 MG tablet   Oral   Take 0.5 tablets (1 mg total) by mouth every 6 (six) hours as needed for severe pain.   30 tablet   0   . ibuprofen (ADVIL,MOTRIN) 800 MG tablet   Oral   Take 800 mg by mouth every 8 (eight) hours as needed for moderate  pain.          Marland Kitchen ipratropium (ATROVENT HFA) 17 MCG/ACT inhaler   Inhalation   Inhale 2 puffs into the lungs every 8 (eight) hours.         . metFORMIN (GLUCOPHAGE) 1000 MG tablet   Oral   Take 1,000 mg by mouth 2 (two) times daily. If blood sugar is higher than 105         . methocarbamol (ROBAXIN) 500 MG tablet   Oral   Take 500 mg by mouth 2 (two) times daily as needed for muscle spasms.          Marland Kitchen omeprazole (PRILOSEC) 20 MG capsule   Oral   Take 40 mg by mouth 2 (two) times daily as needed (acid reflux).         . OXYGEN   Inhalation   Inhale into the lungs 4 (four) times daily.         . rizatriptan (MAXALT) 10 MG tablet   Oral   Take 10 mg by mouth as needed for migraine. May repeat in 2 hours if needed           Allergies Hydrocodone and Vicodin  Family History  Problem Relation Age of Onset  . Lung cancer Father   . Ulcers Father   . Heart disease Sister   . Ulcers Sister   . Heart disease Brother     Social History History  Substance Use Topics  . Smoking status: Never Smoker   . Smokeless tobacco: Not on file  . Alcohol Use: No     Comment: occ    Review of Systems  Constitutional: Per history of present illness. Eyes: Negative for visual changes. ENT: Negative for sore throat. Cardiovascular: Negative for chest pain. Respiratory: Negative for shortness of breath. Gastrointestinal: Negative for diarrhea Genitourinary: Negative for dysuria. Musculoskeletal: Negative for back pain. Skin: Negative for rash. Neurological: Negative for headaches, focal weakness or numbness. 10 point Review of Systems otherwise negative ____________________________________________   PHYSICAL EXAM:  VITAL SIGNS: ED Triage Vitals  Enc Vitals Group     BP 08/03/14 1057 110/77 mmHg     Pulse Rate 08/03/14 1057 95     Resp 08/03/14 1057 18     Temp 08/03/14 1057 98.8 F (37.1 C)     Temp src --      SpO2 08/03/14 1057 98 %     Weight 08/03/14  1057 250 lb (113.399 kg)     Height 08/03/14 1057 5' 2"  (  1.575 m)     Head Cir --      Peak Flow --      Pain Score --      Pain Loc --      Pain Edu? --      Excl. in [place]? --      Constitutional: Alert and oriented. Well appearing overall, but moaning and some abdominal pain.. Eyes: Conjunctivae are normal. PERRL. Normal extraocular movements. ENT   Head: Normocephalic and atraumatic.   Nose: No congestion/rhinnorhea.   Mouth/Throat: Mucous membranes are moist.   Neck: No stridor. Cardiovascular/Chest: Normal rate, regular rhythm.  No murmurs, rubs, or gallops. Respiratory: Normal respiratory effort without tachypnea nor retractions. Breath sounds are clear and equal bilaterally. No wheezes/rales/rhonchi. Gastrointestinal: Soft. No distention, no guarding, no rebound. Obese and diffusely tender in all 4 quadrants. Dry dressings across 3 prior surgical spots. Genitourinary/rectal:Deferred Musculoskeletal: Nontender with normal range of motion in all extremities. No joint effusions.  No lower extremity tenderness nor edema. Neurologic:  Normal speech and language. No gross or focal neurologic deficits are appreciated. Skin:  Skin is warm, dry and intact. No rash noted. Psychiatric: Mood and affect are normal. Speech and behavior are normal. Patient exhibits appropriate insight and judgment.  ____________________________________________   EKG I, Lisa Roca, MD, the attending physician have personally viewed and interpreted all ECGs.  No EKG performed ____________________________________________  LABS (pertinent positives/negatives)  Urinalysis negative Lipase 23 Complete metabolic panel without significant abnormality White blood count 9.7, hemoglobin 10.6  ____________________________________________  RADIOLOGY All Xrays were viewed by me. Imaging interpreted by Radiologist.  CT scan abdomen and pelvis with contrast:  Pending __________________________________________  PROCEDURES  Procedure(s) performed: None Critical Care performed: None  ____________________________________________   ED COURSE / ASSESSMENT AND PLAN  CONSULTATIONS: None  Pertinent labs & imaging results that were available during my care of the patient were reviewed by me and considered in my medical decision making (see chart for details).   Patient has had numerous obligations over the last 2 weeks with a cholecystectomy. Afebrile here without elevated white blood cell count, however she reports a fever yesterday and worsening pain. Given the worsening pain in the recent surgical history, we did discuss proceeding with CT to rule out consultation.  CT abdomen and pelvis pending. Patient had transferred to Dr. Thomasene Lot at shift change 3 PM.  Patient / Family / Caregiver informed of clinical course, medical decision-making process, and agree with plan.   I discussed return precautions, follow-up instructions, and discharged instructions with patient and/or family.  ___________________________________________   FINAL CLINICAL IMPRESSION(S) / ED DIAGNOSES   Final diagnoses:  Generalized abdominal pain       Lisa Roca, MD 08/03/14 1444

## 2014-08-03 NOTE — ED Provider Notes (Signed)
  I accepted care of this patient at 3:45 in signout from Dr. Reita Cliche. The patient recently had her gallbladder removed with a complicated course and abscess formation. She return to the emergency department today due to increasing pain and due to a fever 200.9 yesterday. No fever today.  CT scan is currently pending.  ----------------------------------------- 4:51 PM on 08/03/2014 -----------------------------------------  Dr. Ruthe Mannan has come to the emergency department to further evaluate the patient and arrange for her admission. The patient has a fluid collection in her gallbladder fossa. This is worrisome for either abscess or a bile fluid leak.  CT scan abdomen pelvis:  IMPRESSION: Enlarging gas and fluid collection within the gallbladder fossa now measuring up to 9.1 cm concerning for postoperative abscess.  Stable hepatosplenomegaly  Diagnosis: Fluid collection in the gallbladder fossa status post cholecystectomy. Abdominal pain  Ahmed Prima, MD 08/03/14 [phone]

## 2014-08-03 NOTE — H&P (Signed)
CC: Fevers, abdominal pain, emesis  HPI: [name] is a pleasant 49 yo who was recently discharged on 7/27 following a prolonged hospital course for biliary leak following choelcystectomy who presents with 2 days of worsening abdominal pain, fever to 101.9 at home and emesis.  Was feeling well with good pain control and tolerating diet first few days at home.  However, 2 days ago she began having increased pain which he says runs from her epigastric region to her posterior right flank.  It became unbearable this am.  In addition, has had subjective fevers as well as fevers that she recorded up to 101.9 at home.  + some emesis this am.  Also reports some shortness of breath.  No chills, night sweats, cough, chest pain, diarrhea/constipation, dysuria/hematuria.  Active Ambulatory Problems    Diagnosis Date Noted  . Type 2 diabetes mellitus 07/12/2014  . COPD (chronic obstructive pulmonary disease) 07/12/2014  . HTN (hypertension) 07/12/2014  . GERD (gastroesophageal reflux disease) 07/12/2014  . OSA on CPAP 07/12/2014  . Anxiety 07/12/2014  . Abdominal pain   . Biliary colic 40/98/1191  . Cholecystitis   . Steatohepatitis 07/15/2014  . Bile leak, postoperative 07/17/2014   Resolved Ambulatory Problems    Diagnosis Date Noted  . Choledocholithiasis 07/12/2014   Past Medical History  Diagnosis Date  . Brittle bone disease   . Asthma   . Thyroid disease   . Hypertension   . Back pain   . Diabetes mellitus without complication   . Chronic kidney disease   . Sleep apnea   . Hypothyroidism   . Cervical disc disease   . TIA (transient ischemic attack)   . Cancer   . Collagen vascular disease    Past Surgical History  Procedure Laterality Date  . Abdominal hysterectomy    . Tubal ligation    . Colonoscopy with propofol N/A 06/23/2014    Procedure: COLONOSCOPY WITH PROPOFOL;  Surgeon: Lollie Sails, MD;  Location: [facility] ENDOSCOPY;  Service: Endoscopy;  Laterality: N/A;  .  Esophagogastroduodenoscopy N/A 06/23/2014    Procedure: ESOPHAGOGASTRODUODENOSCOPY (EGD);  Surgeon: Lollie Sails, MD;  Location: [facility] ENDOSCOPY;  Service: Endoscopy;  Laterality: N/A;  . Cholecystectomy N/A 07/15/2014    Procedure: LAPAROSCOPIC CHOLECYSTECTOMY with liver biopsy ;  Surgeon: Sherri Rad, MD;  Location: [facility] ORS;  Service: General;  Laterality: N/A;  . Ercp N/A 07/16/2014    Procedure: ENDOSCOPIC RETROGRADE CHOLANGIOPANCREATOGRAPHY (ERCP);  Surgeon: Clarene Essex, MD;  Location: [facility] ENDOSCOPY;  Service: Endoscopy;  Laterality: N/A;     Medication List    ASK your doctor about these medications        albuterol 108 (90 BASE) MCG/ACT inhaler  Commonly known as:  PROVENTIL HFA;VENTOLIN HFA  Inhale 2 puffs into the lungs every 4 (four) hours as needed for wheezing or shortness of breath.     alprazolam 2 MG tablet  Commonly known as:  XANAX  Take 2 mg by mouth 3 (three) times daily as needed for anxiety.     citalopram 20 MG tablet  Commonly known as:  CELEXA  Take 20 mg by mouth daily.     clotrimazole 10 MG troche  Commonly known as:  MYCELEX  Take 10 mg by mouth 4 (four) times daily as needed (mouth dryness).     fluticasone 50 MCG/ACT nasal spray  Commonly known as:  FLONASE  Place 1 spray into both nostrils daily as needed (dryness).     Fluticasone-Salmeterol 100-50 MCG/DOSE Aepb  Commonly known as:  ADVAIR  Inhale 1 puff into the lungs 2 (two) times daily.     furosemide 40 MG tablet  Commonly known as:  LASIX  Take 80 mg by mouth daily.     gabapentin 100 MG capsule  Commonly known as:  NEURONTIN  Take 300 mg by mouth 4 (four) times daily as needed (pain).     hydrochlorothiazide 25 MG tablet  Commonly known as:  HYDRODIURIL  Take 25 mg by mouth daily.     HYDROmorphone 2 MG tablet  Commonly known as:  DILAUDID  Take 0.5 tablets (1 mg total) by mouth every 6 (six) hours as needed for severe pain.     ibuprofen 800 MG tablet  Commonly known as:   ADVIL,MOTRIN  Take 800 mg by mouth every 8 (eight) hours as needed for moderate pain.     ipratropium 17 MCG/ACT inhaler  Commonly known as:  ATROVENT HFA  Inhale 2 puffs into the lungs every 8 (eight) hours.     metFORMIN 1000 MG tablet  Commonly known as:  GLUCOPHAGE  Take 1,000 mg by mouth 2 (two) times daily. If blood sugar is higher than 105     omeprazole 20 MG capsule  Commonly known as:  PRILOSEC  Take 40 mg by mouth 2 (two) times daily as needed (acid reflux).     OXYGEN  Inhale into the lungs 4 (four) times daily.     rizatriptan 10 MG tablet  Commonly known as:  MAXALT  Take 10 mg by mouth as needed for migraine. May repeat in 2 hours if needed     ROBAXIN 500 MG tablet  Generic drug:  methocarbamol  Take 500 mg by mouth 2 (two) times daily as needed for muscle spasms.       Allergies  Allergen Reactions  . Hydrocodone Itching    Other reaction(s): ITCHING  . Vicodin [Hydrocodone-Acetaminophen] Rash   Family History  Problem Relation Age of Onset  . Lung cancer Father   . Ulcers Father   . Heart disease Sister   . Ulcers Sister   . Heart disease Brother    History   Social History  . Marital Status: Married    Spouse Name: N/A  . Number of Children: N/A  . Years of Education: N/A   Occupational History  . Not on file.   Social History Main Topics  . Smoking status: Never Smoker   . Smokeless tobacco: Not on file  . Alcohol Use: No     Comment: occ  . Drug Use: No  . Sexual Activity: Not on file   Other Topics Concern  . Not on file   Social History Narrative   ROS: Full ROS obtained, pertinent positives and negatives as above.  Blood pressure 110/77, pulse 95, temperature 98.8 F (37.1 C), resp. rate 18, height 5' 2"  (1.575 m), weight 113.399 kg (250 lb), SpO2 98 %. GEN: NAD/A&Ox3 FACE: no obvious facial trauma, normal external nose, normal external ears EYES: no scleral icterus, no conjunctivitis HEAD: normocephalic atraumatic CV:  RRR, no MRG RESP: moving air well, lungs clear ABD: soft, moderately tender epigastric and RUQ, nondistended EXT: moving all ext well, strength 5/5, NEURO: cnII-XII grossly intact, sensation intact all 4 ext  Labs: Personally reviewed, significant for  WBC 9.7 Cl 94, bicarb 32 Bili 0.9  CT: Personally reviewed: Large fluid collection with air in gallbladder fossa  A/P 49 yo s/p lap chole s/p ERCP with stent for  postop bile leak, now presenting with new, worsening RUQ pain.  CT shows enlarged RUQ fluid collection, suspect biloma vs abscess.  Will admit for IVF, nausea control, antibiotics.  Have discussed percutaneous drainage catheter placement with IR and they are planning to do this in the am.  Will make NPO after midnight.  I have discussed plan with patient and she agrees.

## 2014-08-04 ENCOUNTER — Inpatient Hospital Stay: Payer: Medicaid Other

## 2014-08-04 DIAGNOSIS — K651 Peritoneal abscess: Secondary | ICD-10-CM

## 2014-08-04 LAB — CBC
HCT: 27.1 % — ABNORMAL LOW (ref 35.0–47.0)
Hemoglobin: 9.1 g/dL — ABNORMAL LOW (ref 12.0–16.0)
MCH: 31.3 pg (ref 26.0–34.0)
MCHC: 33.7 g/dL (ref 32.0–36.0)
MCV: 92.8 fL (ref 80.0–100.0)
Platelets: 167 10*3/uL (ref 150–440)
RBC: 2.92 MIL/uL — ABNORMAL LOW (ref 3.80–5.20)
RDW: 14 % (ref 11.5–14.5)
WBC: 10.4 10*3/uL (ref 3.6–11.0)

## 2014-08-04 LAB — COMPREHENSIVE METABOLIC PANEL
ALT: 11 U/L — ABNORMAL LOW (ref 14–54)
AST: 20 U/L (ref 15–41)
Albumin: 2.5 g/dL — ABNORMAL LOW (ref 3.5–5.0)
Alkaline Phosphatase: 54 U/L (ref 38–126)
Anion gap: 9 (ref 5–15)
BUN: 7 mg/dL (ref 6–20)
CO2: 31 mmol/L (ref 22–32)
Calcium: 7.8 mg/dL — ABNORMAL LOW (ref 8.9–10.3)
Chloride: 97 mmol/L — ABNORMAL LOW (ref 101–111)
Creatinine, Ser: 0.65 mg/dL (ref 0.44–1.00)
GFR calc Af Amer: >60 mL/min (ref >60)
GFR calc non Af Amer: >60 mL/min (ref >60)
Glucose, Bld: 122 mg/dL — ABNORMAL HIGH (ref 65–99)
Potassium: 3.4 mmol/L — ABNORMAL LOW (ref 3.5–5.1)
Sodium: 137 mmol/L (ref 135–145)
Total Bilirubin: 0.9 mg/dL (ref 0.3–1.2)
Total Protein: 6.5 g/dL (ref 6.5–8.1)

## 2014-08-04 LAB — GLUCOSE, CAPILLARY
Glucose-Capillary: 100 mg/dL — ABNORMAL HIGH (ref 65–99)
Glucose-Capillary: 122 mg/dL — ABNORMAL HIGH (ref 65–99)
Glucose-Capillary: 95 mg/dL (ref 65–99)
Glucose-Capillary: 160 mg/dL — ABNORMAL HIGH (ref 65–99)
Glucose-Capillary: 147 mg/dL — ABNORMAL HIGH (ref 65–99)

## 2014-08-04 LAB — PROTIME-INR
INR: 1.37
Prothrombin Time: 17.1 seconds — ABNORMAL HIGH (ref 11.4–15.0)

## 2014-08-04 MED ORDER — HYDROMORPHONE HCL 1 MG/ML IJ SOLN: 0.5000 mg | INTRAMUSCULAR | Status: DC | PRN

## 2014-08-04 MED ORDER — MIDAZOLAM HCL 5 MG/5ML IJ SOLN
INTRAMUSCULAR | Status: AC
  Filled 2014-08-04: qty 5

## 2014-08-04 MED ORDER — MORPHINE SULFATE ER 15 MG PO TBCR
15.0000 mg | EXTENDED_RELEASE_TABLET | Freq: Two times a day (BID) | ORAL | Status: DC
  Administered 2014-08-04 – 2014-08-05 (×4): 15 mg via ORAL
  Filled 2014-08-04 (×4): qty 1

## 2014-08-04 MED ORDER — FENTANYL CITRATE (PF) 100 MCG/2ML IJ SOLN
INTRAMUSCULAR | Status: AC
  Filled 2014-08-04: qty 2

## 2014-08-04 MED ORDER — MORPHINE SULFATE 15 MG PO TABS
15.0000 mg | ORAL_TABLET | ORAL | Status: DC | PRN
  Administered 2014-08-04 – 2014-08-05 (×3): 15 mg via ORAL
  Filled 2014-08-04 (×3): qty 1

## 2014-08-04 MED ORDER — KETOROLAC TROMETHAMINE 30 MG/ML IJ SOLN
30.0000 mg | Freq: Three times a day (TID) | INTRAMUSCULAR | Status: AC
  Administered 2014-08-04 – 2014-08-05 (×3): 30 mg via INTRAVENOUS
  Filled 2014-08-04 (×3): qty 1

## 2014-08-04 MED ORDER — IPRATROPIUM BROMIDE 0.02 % IN SOLN
0.5000 mg | Freq: Three times a day (TID) | RESPIRATORY_TRACT | Status: DC
  Administered 2014-08-05 – 2014-08-06 (×6): 0.5 mg via RESPIRATORY_TRACT
  Filled 2014-08-04 (×7): qty 2.5

## 2014-08-04 NOTE — OR Nursing (Signed)
Report called to [employer]

## 2014-08-04 NOTE — Procedures (Signed)
Technically successful CT guided placed of a 10 Fr drainage catheter placement into the gall bladder fossa yielding 300 of purulent foul smelling material.   All aspirated samples sent to the laboratory for analysis.   No immediate post procedural complications.   Ronny Bacon, MD Pager #: [phone]

## 2014-08-04 NOTE — Progress Notes (Signed)
Pt back to floor s/p percutaneous cath drainage. Pt asleep but able to respond appropriately. Vitals WNL. PCA syrninge replaced as ordered.  Family not in room at this time. Will continue to monitor.  Report received from [facility] in [place].

## 2014-08-04 NOTE — Progress Notes (Signed)
Pt c/o severe pain in abdomen. Pt shaking all over. Encouraged pt to press PCA. Covered her with warm blankets and encouraged her to take deep breaths & to relax. Family at bedside. Dr. Marina Gravel notified of pt's status. See vitals on flow sheet.  New orders from Dr. Marina Gravel for regular diet. Pt notified.

## 2014-08-04 NOTE — OR Nursing (Signed)
Due to pt sedative state (slurred speech, arousable to voice) and decreased BP SBP 88 Dr Pascal Lux declined ordering any additional moderate sedation at this time.

## 2014-08-04 NOTE — Care Management Note (Addendum)
Case Management Note  Patient Details  Name: Lori Mckenzie MRN: [id number] Date of Birth: [birth year]  Subjective/Objective:       49yo Ms Lori Mckenzie was admitted 08/03/14 with N&V and febrile. Post-op 7 days cholecystectomy with biliary leak. Scheduled today for Percutaneous Drain catheter in the OR. PCP=Evan Askin. Pharmacy=[facility] in [place]. Resides with husband Lori Mckenzie. Has a rolling walker and a CPAP machine at home. Chronic PRN oxygen at 2L N/C. Case Management is following for discharge planning. Anticipate discharge home with home health. Ms Lori [facility]ckenzie chose [facility] because she has used them in the past.              Action/Plan:   Expected Discharge Date:                  Expected Discharge Plan:     In-House Referral:     Discharge planning Services     Post Acute Care Choice:    Choice offered to:     DME Arranged:    DME Agency:     HH Arranged:    [place] Agency:     Status of Service:     Medicare Important Message Given:    Date Medicare IM Given:    Medicare IM give by:    Date Additional Medicare IM Given:    Additional Medicare Important Message give by:     If discussed at [place] of Stay Meetings, dates discussed:    Additional Comments:  [facility]ori Mckenzie A, RN 08/04/2014, 9:23 AM

## 2014-08-04 NOTE — Progress Notes (Signed)
Patient ID: Lori Mckenzie, female   DOB: October 10, 1965, 49 y.o.   MRN: [id number]   Seen following IR drain placement.  Shaking chills  C/o abs pain.  Filed Vitals:   08/04/14 1236 08/04/14 1315 08/04/14 1426 08/04/14 1439  BP: 119/70 109/56    Pulse: 87 103    Temp: 98.6 F (37 C) 100.1 F (37.8 C)    TempSrc: Oral Oral    Resp: 24   26  Height:      Weight:      SpO2: 95% 90% 97% 91%    Alert and oriented abd soft Drain with bloody non-bilious drainage.  CBC Latest Ref Rng 08/04/2014 08/03/2014 07/26/2014  WBC 3.6 - 11.0 K/uL 10.4 9.7 7.8  Hemoglobin 12.0 - 16.0 g/dL 9.1(L) 10.6(L) 11.5(L)  Hematocrit 35.0 - 47.0 % 27.1(L) 32.3(L) 34.6(L)  Platelets 150 - 440 K/uL 167 192 140(L)   CMP Latest Ref Rng 08/04/2014 08/03/2014 07/26/2014  Glucose 65 - 99 mg/dL 122(H) 166(H) 136(H)  BUN 6 - 20 mg/dL 7 10 6   Creatinine 0.44 - 1.00 mg/dL 0.65 0.65 0.63  Sodium 135 - 145 mmol/L 137 137 138  Potassium 3.5 - 5.1 mmol/L 3.4(L) 3.5 4.1  Chloride 101 - 111 mmol/L 97(L) 94(L) 100(L)  CO2 22 - 32 mmol/L 31 32 29  Calcium 8.9 - 10.3 mg/dL 7.8(L) 8.3(L) 8.3(L)  Total Protein 6.5 - 8.1 g/dL 6.5 7.3 6.2(L)  Total Bilirubin 0.3 - 1.2 mg/dL 0.9 0.9 0.5  Alkaline Phos 38 - 126 U/L 54 59 59  AST 15 - 41 U/L 20 26 23   ALT 14 - 54 U/L 11(L) 13(L) 12(L)   IMP:  GB fossa abscess  PlanL await cultures, cont zosyn. Transition to oral morphine and dc PCA.  Discussed in detail with her and two sons at bedside.

## 2014-08-04 NOTE — OR Nursing (Signed)
300 ml white intially then bloody drainage removed from abcess, suture secured tube with straight drainage bag placed. Sample to lab for culture and gram stain.

## 2014-08-04 NOTE — Progress Notes (Signed)
Spoke with [place] in [place]. Per Dr. Marina Gravel, instructions to hold all 1000 medicines today.  Medications held at this time. Will continue to monitor.

## 2014-08-05 DIAGNOSIS — F341 Dysthymic disorder: Secondary | ICD-10-CM

## 2014-08-05 DIAGNOSIS — F419 Anxiety disorder, unspecified: Secondary | ICD-10-CM

## 2014-08-05 DIAGNOSIS — Z659 Problem related to unspecified psychosocial circumstances: Secondary | ICD-10-CM

## 2014-08-05 LAB — GLUCOSE, CAPILLARY
Glucose-Capillary: 101 mg/dL — ABNORMAL HIGH (ref 65–99)
Glucose-Capillary: 105 mg/dL — ABNORMAL HIGH (ref 65–99)
Glucose-Capillary: 105 mg/dL — ABNORMAL HIGH (ref 65–99)
Glucose-Capillary: 125 mg/dL — ABNORMAL HIGH (ref 65–99)
Glucose-Capillary: 137 mg/dL — ABNORMAL HIGH (ref 65–99)
Glucose-Capillary: 80 mg/dL (ref 65–99)
Glucose-Capillary: 94 mg/dL (ref 65–99)

## 2014-08-05 LAB — MAGNESIUM: Magnesium: 2.1 mg/dL (ref 1.7–2.4)

## 2014-08-05 LAB — CBC
HCT: 30.4 % — ABNORMAL LOW (ref 35.0–47.0)
Hemoglobin: 10 g/dL — ABNORMAL LOW (ref 12.0–16.0)
MCH: 31 pg (ref 26.0–34.0)
MCHC: 32.8 g/dL (ref 32.0–36.0)
MCV: 94.4 fL (ref 80.0–100.0)
Platelets: 158 10*3/uL (ref 150–440)
RBC: 3.22 MIL/uL — ABNORMAL LOW (ref 3.80–5.20)
RDW: 14.2 % (ref 11.5–14.5)
WBC: 5.2 10*3/uL (ref 3.6–11.0)

## 2014-08-05 MED ORDER — SUMATRIPTAN SUCCINATE 50 MG PO TABS
50.0000 mg | ORAL_TABLET | ORAL | Status: DC | PRN
  Administered 2014-08-05: 50 mg via ORAL
  Filled 2014-08-05 (×2): qty 1

## 2014-08-05 MED ORDER — POTASSIUM CHLORIDE CRYS ER 20 MEQ PO TBCR
40.0000 meq | EXTENDED_RELEASE_TABLET | Freq: Once | ORAL | Status: AC
  Administered 2014-08-05: 40 meq via ORAL
  Filled 2014-08-05: qty 2

## 2014-08-05 MED ORDER — AMOXICILLIN-POT CLAVULANATE 875-125 MG PO TABS
1.0000 | ORAL_TABLET | Freq: Two times a day (BID) | ORAL | Status: DC
  Administered 2014-08-05 – 2014-08-06 (×3): 1 via ORAL
  Filled 2014-08-05 (×3): qty 1

## 2014-08-05 MED ORDER — PANTOPRAZOLE SODIUM 40 MG PO TBEC
40.0000 mg | DELAYED_RELEASE_TABLET | Freq: Every day | ORAL | Status: DC
  Administered 2014-08-05 – 2014-08-07 (×3): 40 mg via ORAL
  Filled 2014-08-05 (×3): qty 1

## 2014-08-05 MED ORDER — MORPHINE SULFATE 15 MG PO TABS
15.0000 mg | ORAL_TABLET | ORAL | Status: DC | PRN
  Administered 2014-08-05 (×3): 15 mg via ORAL
  Filled 2014-08-05 (×4): qty 1

## 2014-08-05 MED ORDER — PROCHLORPERAZINE MALEATE 10 MG PO TABS
10.0000 mg | ORAL_TABLET | Freq: Four times a day (QID) | ORAL | Status: DC | PRN
  Administered 2014-08-05 – 2014-08-07 (×3): 10 mg via ORAL
  Filled 2014-08-05 (×4): qty 1

## 2014-08-05 MED ORDER — METFORMIN HCL 500 MG PO TABS
500.0000 mg | ORAL_TABLET | Freq: Every day | ORAL | Status: DC
  Filled 2014-08-05 (×2): qty 1

## 2014-08-05 NOTE — Progress Notes (Signed)
Pt refuses to walk at this time.  Pt is up to BR in room but does not feel as though she can make laps around the nurses station.  Will continue to monitor and encourage.

## 2014-08-05 NOTE — Consult Note (Addendum)
[facility] [place] NAME: Lori Mckenzie    MR#:  [id number]  DATE OF BIRTH:  12-08-1965  DATE OF ADMISSION:  08/03/2014  PRIMARY CARE PHYSICIAN: Ashkin, Neldon Labella, MD   REQUESTING/REFERRING PHYSICIAN: Sherri Rad, MD  CHIEF COMPLAINT:   Chief Complaint  Patient presents with  . Fever  . Nausea  . Abdominal Pain   complaint of headache/migraine, or abdominal pain is 8 out of 10, Feels that blood is pouring out of her wound. Having some chills/rigors HISTORY OF PRESENT ILLNESS:  Lori Mckenzie  is a 49 y.o. female with a known history of COPD, hypertension, cholecystitis who was recently discharged on 7/27 following a prolonged hospital course for biliary leak requiring choelcystectomy admitted with 2 days of worsening abdominal pain, fever to 101.9 at home and emesis. CT on admission showed enlarged RUQ fluid collection worrisome for biloma vs abscess. Underwent percutaneous drainage catheter placement by IR on first of August, she has been draining successfully, she reports this pain for per fifth bag replaced since yesterday and has been draining bloody secretions.  Her pain/cramping/tight feeling in the abdomen is about 8 out of 10, she also reports migraine headache. PAST MEDICAL HISTORY:   Past Medical History  Diagnosis Date  . Brittle bone disease   . Asthma   . Thyroid disease   . Hypertension   . Back pain   . Diabetes mellitus without complication   . Chronic kidney disease   . Anxiety   . GERD (gastroesophageal reflux disease)   . Sleep apnea   . COPD (chronic obstructive pulmonary disease)   . Hypothyroidism   . Cervical disc disease   . TIA (transient ischemic attack)   . Cancer     Uteriine  ca 61yr ago partial hysterectomy  . Collagen vascular disease     RA  3-4 yrs ago   PAST SURGICAL HISTOIRY:   Past Surgical History  Procedure Laterality Date  . Abdominal hysterectomy    . Tubal ligation    . Colonoscopy with  propofol N/A 06/23/2014    Procedure: COLONOSCOPY WITH PROPOFOL;  Surgeon: MLollie Sails MD;  [facility];  Service: Endoscopy;  Laterality: N/A;  . Esophagogastroduodenoscopy N/A 06/23/2014    Procedure: ESOPHAGOGASTRODUODENOSCOPY (EGD);  Surgeon: MLollie Sails MD;  [facility];  Service: Endoscopy;  Laterality: N/A;  . Cholecystectomy N/A 07/15/2014    Procedure: LAPAROSCOPIC CHOLECYSTECTOMY with liver biopsy ;  Surgeon: MSherri Rad MD;  Location: [facility] ORS;  Service: General;  Laterality: N/A;  . Ercp N/A 07/16/2014    Procedure: ENDOSCOPIC RETROGRADE CHOLANGIOPANCREATOGRAPHY (ERCP);  Surgeon: MClarene Essex MD;  [facility];  Service: Endoscopy;  Laterality: N/A;   SOCIAL HISTORY:   History  Substance Use Topics  . Smoking status: Never Smoker   . Smokeless tobacco: Not on file  . Alcohol Use: No     Comment: occ   FAMILY HISTORY:   Family History  Problem Relation Age of Onset  . Lung cancer Father   . Ulcers Father   . Heart disease Sister   . Ulcers Sister   . Heart disease Brother    DRUG ALLERGIES:   Allergies  Allergen Reactions  . Hydrocodone Itching    Other reaction(s): ITCHING  . Vicodin [Hydrocodone-Acetaminophen] Rash   REVIEW OF SYSTEMS:  CONSTITUTIONAL: No fever, fatigue or weakness.  EYES: No blurred or double vision.  EARS, NOSE, AND THROAT: No tinnitus or ear  pain. Migraine headache RESPIRATORY: No cough, shortness of breath, wheezing or hemoptysis.  CARDIOVASCULAR: No chest pain, orthopnea, edema.  GASTROINTESTINAL: No nausea, vomiting, diarrhea, + abdominal pain.  GENITOURINARY: No dysuria, hematuria.  ENDOCRINE: No polyuria, nocturia. HEMATOLOGY: No anemia, easy bruising or bleeding SKIN: No rash or lesion. MUSCULOSKELETAL: No joint pain or arthritis.   NEUROLOGIC: No tingling, numbness, weakness.  PSYCHIATRY: No anxiety or depression.  MEDICATIONS AT HOME:   Prior to Admission medications   Medication  Sig Start Date End Date Taking? Authorizing Provider  albuterol (PROVENTIL HFA;VENTOLIN HFA) 108 (90 BASE) MCG/ACT inhaler Inhale 2 puffs into the lungs every 4 (four) hours as needed for wheezing or shortness of breath.   Yes Historical Provider, MD  alprazolam Duanne Moron) 2 MG tablet Take 2 mg by mouth 3 (three) times daily as needed for anxiety.    Yes Historical Provider, MD  citalopram (CELEXA) 20 MG tablet Take 20 mg by mouth daily.   Yes Historical Provider, MD  clotrimazole (MYCELEX) 10 MG troche Take 10 mg by mouth 4 (four) times daily as needed (mouth dryness).   Yes Historical Provider, MD  fluticasone (FLONASE) 50 MCG/ACT nasal spray Place 1 spray into both nostrils daily as needed (dryness).    Yes Historical Provider, MD  Fluticasone-Salmeterol (ADVAIR) 100-50 MCG/DOSE AEPB Inhale 1 puff into the lungs 2 (two) times daily.   Yes Historical Provider, MD  furosemide (LASIX) 40 MG tablet Take 80 mg by mouth 2 (two) times daily.    Yes Historical Provider, MD  gabapentin (NEURONTIN) 100 MG capsule Take 300 mg by mouth 4 (four) times daily as needed (pain).   Yes Historical Provider, MD  hydrochlorothiazide (HYDRODIURIL) 25 MG tablet Take 12.5 mg by mouth daily as needed (id diastolic blood pressure >44).    Yes Historical Provider, MD  HYDROmorphone (DILAUDID) 2 MG tablet Take 0.5 tablets (1 mg total) by mouth every 6 (six) hours as needed for severe pain. 07/30/14  Yes Marlyce Huge, MD  ibuprofen (ADVIL,MOTRIN) 800 MG tablet Take 800 mg by mouth every 8 (eight) hours as needed for moderate pain.    Yes Historical Provider, MD  ipratropium (ATROVENT HFA) 17 MCG/ACT inhaler Inhale 2 puffs into the lungs every 8 (eight) hours.   Yes Historical Provider, MD  metFORMIN (GLUCOPHAGE) 1000 MG tablet Take 500-1,000 mg by mouth 2 (two) times daily. Dose range based on blood sugar   Yes Historical Provider, MD  methocarbamol (ROBAXIN) 500 MG tablet Take 500 mg by mouth 2 (two) times daily as needed  for muscle spasms.  03/26/14  Yes Historical Provider, MD  omeprazole (PRILOSEC) 20 MG capsule Take 40 mg by mouth 2 (two) times daily as needed (acid reflux).   Yes Historical Provider, MD  OXYGEN Inhale 1.5-2 L into the lungs continuous.    Yes Historical Provider, MD  rizatriptan (MAXALT) 10 MG tablet Take 10 mg by mouth as needed for migraine. May repeat in 2 hours if needed   Yes Historical Provider, MD    VITAL SIGNS:  Blood pressure 81/54, pulse 71, temperature 97.9 F (36.6 C), temperature source Oral, resp. rate 16, height 5' 2"  (1.575 m), weight 113.399 kg (250 lb), SpO2 89 %. PHYSICAL EXAMINATION:  GENERAL:  49 y.o.-year-old patient lying in the bed with lots of abdominal discomfort/pain.  She is also having chills/Rigors EYES: Pupils equal, round, reactive to light and accommodation. No scleral icterus. Extraocular muscles intact.  HEENT: Head atraumatic, normocephalic. Oropharynx and nasopharynx clear.  NECK:  Supple, no jugular venous distention. No thyroid enlargement, no tenderness.  LUNGS: Normal breath sounds bilaterally, no wheezing, rales,rhonchi or crepitation. No use of accessory muscles of respiration.  CARDIOVASCULAR: S1, S2 normal. No murmurs, rubs, or gallops.  ABDOMEN: Soft, tender right upper quadrant and epigastric area, mildly distended. Bowel sounds present. No organomegaly or mass.  She has discomfort all over her belly. Some guarding EXTREMITIES: No pedal edema, cyanosis, or clubbing.  NEUROLOGIC: Cranial nerves II through XII are intact. Muscle strength 5/5 in all extremities. Sensation intact. Gait not checked.  PSYCHIATRIC: The patient is alert and oriented x 3.  SKIN: No obvious rash, lesion, or ulcer.  Will drain coming out of the right upper quadrant draining bloody secretion, bag attached in the left thigh area  LABORATORY PANEL:   CBC  Recent Labs Lab 08/05/14 0952  WBC 5.2  HGB 10.0*  HCT 30.4*  PLT 158    ------------------------------------------------------------------------------------------------------------------  Chemistries   Recent Labs Lab 08/04/14 0507  NA 137  K 3.4*  CL 97*  CO2 31  GLUCOSE 122*  BUN 7  CREATININE 0.65  CALCIUM 7.8*  AST 20  ALT 11*  ALKPHOS 54  BILITOT 0.9    RADIOLOGY:  Ct Abdomen Pelvis W Contrast  08/03/2014   CLINICAL DATA:  CT-guided drainage done 07/23/2014. Fevers, nausea, vomiting. Severe abdominal pain. Recent gallbladder surgery.  EXAM: CT ABDOMEN AND PELVIS WITH CONTRAST  TECHNIQUE: Multidetector CT imaging of the abdomen and pelvis was performed using the standard protocol following bolus administration of intravenous contrast.  CONTRAST:  137m OMNIPAQUE IOHEXOL 300 MG/ML  SOLN  COMPARISON:  07/22/2014  FINDINGS: Lung bases are clear.  No effusions.  Heart is normal size.  Previously placed drainage catheter is no longer present. The gas and fluid collection within the gallbladder fossa has enlarged, now measuring 9.1 x 7.0 cm. This is compatible with enlarging gallbladder fossa abscess.  Small cysts scattered throughout the liver. Spleen for is enlarged with a craniocaudal length of 21 cm. Stable hepatomegaly. Stent within the distal common bile duct and extending into the duodenum is unchanged. Pancreas, adrenals, kidneys are unremarkable.  Small amount of free fluid in the pelvis. Stomach, large and small bowel are unremarkable. Urinary bladder unremarkable.  IMPRESSION: Enlarging gas and fluid collection within the gallbladder fossa now measuring up to 9.1 cm concerning for postoperative abscess.  Stable hepatosplenomegaly.   Electronically Signed   By: KRolm BaptiseM.D.   On: 08/03/2014 16:26   Ct Image Guided Drainage By Percutaneous Catheter  08/04/2014   INDICATION: History of bile leak following cholecystectomy, post CT-guided percutaneous drainage catheter placed on 07/23/2014. The percutaneous drainage catheter was subsequently  removed, however the patient return to the hospital with recurrent gas and fluid collection within the gallbladder fossa worrisome for a a residual leak and/or abscess. Please perform CT-guided percutaneous drainage catheter placement.  EXAM: CT IMAGE GUIDED DRAINAGE BY PERCUTANEOUS CATHETER  COMPARISON:  CT abdomen and pelvis - 08/03/2014; 07/22/2014; CT-guided percutaneous drainage catheter placement - 07/23/2014  MEDICATIONS: The patient is currently admitted to the hospital and receiving intravenous antibiotics. The antibiotics were administered within an appropriate time frame prior to the initiation of the procedure.  ANESTHESIA/SEDATION: Conscious sedation was achieved with intravenous Versed and Fentanyl  Total Moderate Sedation time  20 minutes  CONTRAST:  None  COMPLICATIONS: None immediate  PROCEDURE: Informed written consent was obtained from the patient after a discussion of the risks, benefits and alternatives to treatment. The patient was  placed supine on the CT gantry and a pre procedural CT was performed re-demonstrating the known air-containing abscess/fluid collection within the gallbladder fossa with dominant component measuring approximately 8.5 x 7.1 cm (image 39, series 2). The procedure was planned. A timeout was performed prior to the initiation of the procedure.  The skin overlying the right upper abdominal quadrant was prepped and draped in the usual sterile fashion. The overlying soft tissues were anesthetized with 1% lidocaine with epinephrine. Appropriate trajectory was planned with the use of a 22 gauge spinal needle. Utilizing a trans hepatic approach, a 18 gauge trocar needle was advanced into the abscess/fluid collection and a short Amplatz super stiff wire was coiled within the collection. Appropriate positioning was confirmed with a limited CT scan. The tract was serially dilated allowing placement of a 10 [place] all-purpose drainage catheter. Appropriate positioning was confirmed  with a limited postprocedural CT scan.  Approximately 300 Ml of purulent, foul smelling fluid was aspirated. The tube was connected to a drainage bag and sutured in place. A dressing was placed. The patient tolerated the procedure well without immediate post procedural complication.  IMPRESSION: Successful CT guided placement of a 10 French all purpose drain catheter into the gallbladder fossa via a transhepatic approach with aspiration of 300 mL of purulent, foul smelling fluid. Samples were sent to the laboratory as requested by the ordering clinical team.   Electronically Signed   By: Sandi Mariscal M.D.   On: 08/04/2014 15:24   IMPRESSION AND PLAN:   * Hypotension: With low-grade fever and chills/rigors, abdominal cavity infection ( purulent, foul-smelling 300 mL of fluid drained by IR) - worrisome for septic shock, continue IV Zosyn.  Consider transfer to CCU if primary team in agreement for pressors. Her hypotension could be due to narcotics.  Primary team has already stopped her antihypertensive medications that she was receiving which is appropriate  * Anemia: Likely acute blood loss, 11.5 ->10.6 -> 9.1, close monitoring.  May require blood transfusion if continues to drop hb  * Migraine headache: Uses Maxalt at home, start sumatriptan.  * Gallbladder fossa abscess: Continue IV Zosyn, narrow antibiotics based on culture sensitivity test on purulent secretions sent to the lab from prior  * Hypokalemia: Replete and recheck, check magnesium  * Type 2 diabetes mellitus - sliding scale insulin with fingerstick glucose checks every 6 hours for now  * COPD (chronic obstructive pulmonary disease) - continue home inhalers.  * OSA on CPAP - CPAP daily at bedtime while here  * GERD (gastroesophageal reflux disease) - equivalent home dose PPI  * Anxiety - continue home medications, consider psychiatry consultation  All the records are reviewed and case discussed with Consulting provider.  Consider  transfer to CCU Management plans discussed with the patient and she is in agreement.  CODE STATUS: Full Code  TOTAL TIME TAKING CARE OF THIS PATIENT: 55 minutes.   [facility], Marvina Danner M.D on 08/05/2014 at 11:16 AM  Between 7am to 6pm - Pager - [phone]  After 6pm go to [email or web address] - password [email or web address] [facility]  Office  [phone]  CC: Sherri Rad, MD Primary care Physician: Ardine Eng, MD

## 2014-08-05 NOTE — [facility] Counselor (Signed)
Informed Dr. Weber Cooks of need for Psych consult.

## 2014-08-05 NOTE — Consult Note (Signed)
[facility] Face-to-Face Psychiatry Consult   Reason for Consult:  Consult for this 49 year old woman with a history of chronic anxiety and mood symptoms who is currently in the hospital for management of complications of surgery. Referring Physician:  Felton Clinton Patient Identification: Lori Mckenzie MRN:  [id number] Principal Diagnosis: <principal problem not specified> Diagnosis:  Anxiety disorder NOS Patient Active Problem List   Diagnosis Date Noted  . Dysthymia [F34.1] 08/05/2014  . Other social stressor [Z65.9] 08/05/2014  . Abscess of abdominal cavity [K65.1] 08/03/2014  . Bile leak, postoperative [K92.9, K83.8] 07/17/2014  . Steatohepatitis [K75.81] 07/15/2014  . Cholecystitis [K81.9]   . Biliary colic [D78.24] 23/53/6144  . Abdominal pain [R10.9]   . Type 2 diabetes mellitus [E11.9] 07/12/2014  . COPD (chronic obstructive pulmonary disease) [J44.9] 07/12/2014  . HTN (hypertension) [I10] 07/12/2014  . GERD (gastroesophageal reflux disease) [K21.9] 07/12/2014  . OSA on CPAP [G47.33] 07/12/2014  . Anxiety [F41.9] 07/12/2014    Total Time spent with patient: 1 hour  Subjective:   Lori Mckenzie is a 49 y.o. female patient admitted with "I just feel all alone".  HPI:  This is a 49 year old woman with a history of long-standing depression and anxiety who is currently in the hospital with some surgical complications and pain. Information obtained from the patient and the chart. Patient describes multiple anxiety causing situations in her life. Foremost among those is her concern about her youngest son, who apparently has a substance abuse problem that was as active distress and hard ache for his family. Even today apparently there were concerns that he had been stealing medication. Patient is angry and upset about that. Both she and her husband are currently admitted to the hospital leaving him feeling helpless. This is on top of multiple social stresses. They are uncertain about the stability of their  living situation and also having medical problems obviously. Patient says that her mood stays down and anxious much of the time. She has intermittent problems with insomnia for which she takes Ambien. She denies however having any suicidal thoughts and denies any psychotic symptoms. The patient denies that she misuses or abuses any medication or alcohol or drugs. She is currently receiving Xanax 1 mg when necessary in the hospital which is half of her usual outpatient dose, but the patient says she prefers it that way because she does not want to overdo it on the Xanax. The treatment team has noticed some lability in her response to pain. Patient reports that she is still having significant pain in her abdomen although during the time I'm interviewing her she does not appear to be in particular discomfort.  Past psychiatric history: Patient has been seeing Dr. Kasandra Knudsen for psychiatric management for 15-20 years. She is currently on Xanax 2 mg 3 times a day as needed as well as citalopram 20 mg per day. She has been on this med combination for years. She has never had a psychiatric hospitalization. Denies any history of suicide attempts. Denies any history of psychosis or mania.  Social history: Patient is married and is the mother of 3 sons the oldest of whom is deceased. The youngest one has a troubling substance abuse problem. Evidently there is some chaos around the patient's living situation. The landlord and they had been dependent on further living situation passed away a month ago and she feels like his son may be trying to put them out of their home. Nevertheless she does admit that she has a place to live currently.  Medical history: Patient is having pain related apparently to an abscess from a biliary leak from a cholecystectomy. History of hypertension diabetes COPD  Family history: Positive for substance abuse in her son as well as anxiety.  Substance abuse history: Patient denies that she has had  substance abuse problems. Apparently she used to go to the pain clinic in [place] for her chronic narcotic management but was dismissed from the clinic because she had a urine drug screen positive for marijuana. The patient adamantly denies to me that she had consumed any marijuana and insists that it must been a mistake. Since then she has been getting her pain medication from a local provider. HPI Elements:   Quality:  anxiety irritability mood lability depression. Severity:  moderate. Timing:  worse in the last few days because of her multiple stresses and particularly bad in the hospital. Duration:  chronic problem with intermittent exacerbation. Context:  acute pain, concern about her son's addiction problem, sickness in her husband, social unrest.  Past Medical History:  Past Medical History  Diagnosis Date  . Brittle bone disease   . Asthma   . Thyroid disease   . Hypertension   . Back pain   . Diabetes mellitus without complication   . Chronic kidney disease   . Anxiety   . GERD (gastroesophageal reflux disease)   . Sleep apnea   . COPD (chronic obstructive pulmonary disease)   . Hypothyroidism   . Cervical disc disease   . TIA (transient ischemic attack)   . Cancer     Uteriine  ca 60yr ago partial hysterectomy  . Collagen vascular disease     RA  3-4 yrs ago    Past Surgical History  Procedure Laterality Date  . Abdominal hysterectomy    . Tubal ligation    . Colonoscopy with propofol N/A 06/23/2014    Procedure: COLONOSCOPY WITH PROPOFOL;  Surgeon: MLollie Sails MD;  [facility];  Service: Endoscopy;  Laterality: N/A;  . Esophagogastroduodenoscopy N/A 06/23/2014    Procedure: ESOPHAGOGASTRODUODENOSCOPY (EGD);  Surgeon: MLollie Sails MD;  [facility];  Service: Endoscopy;  Laterality: N/A;  . Cholecystectomy N/A 07/15/2014    Procedure: LAPAROSCOPIC CHOLECYSTECTOMY with liver biopsy ;  Surgeon: MSherri Rad MD;  Location: [facility] ORS;   Service: General;  Laterality: N/A;  . Ercp N/A 07/16/2014    Procedure: ENDOSCOPIC RETROGRADE CHOLANGIOPANCREATOGRAPHY (ERCP);  Surgeon: MClarene Essex MD;  [facility];  Service: Endoscopy;  Laterality: N/A;   Family History:  Family History  Problem Relation Age of Onset  . Lung cancer Father   . Ulcers Father   . Heart disease Sister   . Ulcers Sister   . Heart disease Brother    Social History:  History  Alcohol Use No    Comment: occ     History  Drug Use No    History   Social History  . Marital Status: Married    Spouse Name: N/A  . Number of Children: N/A  . Years of Education: N/A   Social History Main Topics  . Smoking status: Never Smoker   . Smokeless tobacco: Not on file  . Alcohol Use: No     Comment: occ  . Drug Use: No  . Sexual Activity: Not on file   Other Topics Concern  . None   Social History Narrative   Additional Social History:  Allergies:   Allergies  Allergen Reactions  . Hydrocodone Itching    Other reaction(s): ITCHING  . Vicodin [Hydrocodone-Acetaminophen] Rash    Labs:  Results for orders placed or performed during the hospital encounter of 08/03/14 (from the past 48 hour(s))  Glucose, capillary     Status: Abnormal   Collection Time: 08/03/14 11:33 PM  Result Value Ref Range   Glucose-Capillary 133 (H) 65 - 99 mg/dL  Glucose, capillary     Status: Abnormal   Collection Time: 08/04/14  3:48 AM  Result Value Ref Range   Glucose-Capillary 100 (H) 65 - 99 mg/dL  Comprehensive metabolic panel     Status: Abnormal   Collection Time: 08/04/14  5:07 AM  Result Value Ref Range   Sodium 137 135 - 145 mmol/L   Potassium 3.4 (L) 3.5 - 5.1 mmol/L   Chloride 97 (L) 101 - 111 mmol/L   CO2 31 22 - 32 mmol/L   Glucose, Bld 122 (H) 65 - 99 mg/dL   BUN 7 6 - 20 mg/dL   Creatinine, Ser 0.65 0.44 - 1.00 mg/dL   Calcium 7.8 (L) 8.9 - 10.3 mg/dL   Total Protein 6.5 6.5 - 8.1 g/dL   Albumin 2.5  (L) 3.5 - 5.0 g/dL   AST 20 15 - 41 U/L   ALT 11 (L) 14 - 54 U/L   Alkaline Phosphatase 54 38 - 126 U/L   Total Bilirubin 0.9 0.3 - 1.2 mg/dL   GFR calc non Af Amer >60 >60 mL/min   GFR calc Af Amer >60 >60 mL/min    Comment: (NOTE) The eGFR has been calculated using the CKD EPI equation. This calculation has not been validated in all clinical situations. eGFR's persistently <60 mL/min signify possible Chronic Kidney Disease.    Anion gap 9 5 - 15  CBC     Status: Abnormal   Collection Time: 08/04/14  5:07 AM  Result Value Ref Range   WBC 10.4 3.6 - 11.0 K/uL   RBC 2.92 (L) 3.80 - 5.20 MIL/uL   Hemoglobin 9.1 (L) 12.0 - 16.0 g/dL   HCT 27.1 (L) 35.0 - 47.0 %   MCV 92.8 80.0 - 100.0 fL   MCH 31.3 26.0 - 34.0 pg   MCHC 33.7 32.0 - 36.0 g/dL   RDW 14.0 11.5 - 14.5 %   Platelets 167 150 - 440 K/uL  Protime-INR     Status: Abnormal   Collection Time: 08/04/14  5:07 AM  Result Value Ref Range   Prothrombin Time 17.1 (H) 11.4 - 15.0 seconds   INR 1.37   Glucose, capillary     Status: Abnormal   Collection Time: 08/04/14  7:18 AM  Result Value Ref Range   Glucose-Capillary 122 (H) 65 - 99 mg/dL  Culture, routine-abscess     Status: None (Preliminary result)   Collection Time: 08/04/14 12:13 PM  Result Value Ref Range   Specimen Description ABDOMEN    Special Requests Normal    Gram Stain PENDING    Culture HOLDING FOR POSSIBLE PATHOGEN    Report Status PENDING   Glucose, capillary     Status: None   Collection Time: 08/04/14 12:57 PM  Result Value Ref Range   Glucose-Capillary 95 65 - 99 mg/dL  Glucose, capillary     Status: Abnormal   Collection Time: 08/04/14  5:05 PM  Result Value Ref Range   Glucose-Capillary 160 (H) 65 - 99 mg/dL  Glucose, capillary     Status:  Abnormal   Collection Time: 08/04/14  8:47 PM  Result Value Ref Range   Glucose-Capillary 147 (H) 65 - 99 mg/dL  Glucose, capillary     Status: Abnormal   Collection Time: 08/05/14 12:12 AM  Result Value  Ref Range   Glucose-Capillary 137 (H) 65 - 99 mg/dL  Glucose, capillary     Status: Abnormal   Collection Time: 08/05/14  4:34 AM  Result Value Ref Range   Glucose-Capillary 101 (H) 65 - 99 mg/dL  Glucose, capillary     Status: Abnormal   Collection Time: 08/05/14  7:43 AM  Result Value Ref Range   Glucose-Capillary 105 (H) 65 - 99 mg/dL   Comment 1 Notify RN   CBC     Status: Abnormal   Collection Time: 08/05/14  9:52 AM  Result Value Ref Range   WBC 5.2 3.6 - 11.0 K/uL   RBC 3.22 (L) 3.80 - 5.20 MIL/uL   Hemoglobin 10.0 (L) 12.0 - 16.0 g/dL   HCT 30.4 (L) 35.0 - 47.0 %   MCV 94.4 80.0 - 100.0 fL   MCH 31.0 26.0 - 34.0 pg   MCHC 32.8 32.0 - 36.0 g/dL   RDW 14.2 11.5 - 14.5 %   Platelets 158 150 - 440 K/uL  Glucose, capillary     Status: Abnormal   Collection Time: 08/05/14 11:44 AM  Result Value Ref Range   Glucose-Capillary 125 (H) 65 - 99 mg/dL   Comment 1 Notify RN   Magnesium     Status: None   Collection Time: 08/05/14 12:25 PM  Result Value Ref Range   Magnesium 2.1 1.7 - 2.4 mg/dL  Glucose, capillary     Status: None   Collection Time: 08/05/14  5:01 PM  Result Value Ref Range   Glucose-Capillary 94 65 - 99 mg/dL   Comment 1 Notify RN   Glucose, capillary     Status: Abnormal   Collection Time: 08/05/14  8:06 PM  Result Value Ref Range   Glucose-Capillary 105 (H) 65 - 99 mg/dL    Vitals: Blood pressure 101/57, pulse 84, temperature 98.6 F (37 C), temperature source Oral, resp. rate 17, height 5' 2"  (1.575 m), weight 113.399 kg (250 lb), SpO2 87 %.  Risk to Self: Is patient at risk for suicide?: No Risk to Others:   Prior Inpatient Therapy:   Prior Outpatient Therapy:    Current Facility-Administered Medications  Medication Dose Route Frequency Provider Last Rate Last Dose  . 0.9 %  sodium chloride infusion   Intravenous Continuous Marlyce Huge, MD 75 mL/hr at 08/05/14 0704    . acetaminophen (TYLENOL) tablet 650 mg  650 mg Oral Q6H PRN  Marlyce Huge, MD   650 mg at 08/04/14 1497   Or  . acetaminophen (TYLENOL) suppository 650 mg  650 mg Rectal Q6H PRN Marlyce Huge, MD      . albuterol (PROVENTIL) (2.5 MG/3ML) 0.083% nebulizer solution 2.5 mg  2.5 mg Inhalation Q4H PRN Marlyce Huge, MD      . ALPRAZolam Duanne Moron) tablet 1 mg  1 mg Oral TID PRN Dia Crawford III, MD   1 mg at 08/05/14 1932  . amoxicillin-clavulanate (AUGMENTIN) 875-125 MG per tablet 1 tablet  1 tablet Oral Q12H Sherri Rad, MD   1 tablet at 08/05/14 1055  . citalopram (CELEXA) tablet 20 mg  20 mg Oral Daily Marlyce Huge, MD   20 mg at 08/05/14 1054  . clotrimazole (MYCELEX) troche 10 mg  10 mg Oral  QID PRN Marlyce Huge, MD      . fluticasone (FLONASE) 50 MCG/ACT nasal spray 1 spray  1 spray Each Nare Daily PRN Marlyce Huge, MD      . gabapentin (NEURONTIN) capsule 300 mg  300 mg Oral QID PRN Marlyce Huge, MD      . insulin aspart (novoLOG) injection 0-9 Units  0-9 Units Subcutaneous 6 times per day Marlyce Huge, MD   1 Units at 08/05/14 1251  . ipratropium (ATROVENT) nebulizer solution 0.5 mg  0.5 mg Inhalation TID Marlyce Huge, MD   0.5 mg at 08/05/14 2008  . [START ON 08/06/2014] metFORMIN (GLUCOPHAGE) tablet 500 mg  500 mg Oral Q breakfast Sherri Rad, MD      . mometasone-formoterol [facility]) 100-5 MCG/ACT inhaler 2 puff  2 puff Inhalation BID Marlyce Huge, MD   2 puff at 08/05/14 2008  . morphine (MS CONTIN) 12 hr tablet 15 mg  15 mg Oral Q12H Sherri Rad, MD   15 mg at 08/05/14 1055  . morphine (MSIR) tablet 15 mg  15 mg Oral Q4H PRN Sherri Rad, MD   15 mg at 08/05/14 1934  . pantoprazole (PROTONIX) EC tablet 40 mg  40 mg Oral QHS Marlyce Huge, MD      . SUMAtriptan (IMITREX) tablet 50 mg  50 mg Oral Q2H PRN Max Sane, MD   50 mg at 08/05/14 1251    Musculoskeletal: Strength & Muscle Tone: decreased Gait & Station: unable to stand Patient leans: N/A  Psychiatric  Specialty Exam: Physical Exam  Constitutional: She appears well-developed and well-nourished. She has a sickly appearance. She appears distressed. Nasal cannula in place.  HENT:  Head: Normocephalic and atraumatic.  Eyes: Conjunctivae are normal. Pupils are equal, round, and reactive to light.  Neck: Normal range of motion.  Cardiovascular: Normal heart sounds.   Respiratory: Effort normal.  GI: She exhibits distension. There is tenderness.  Musculoskeletal: Normal range of motion.  Neurological: She is alert.  Skin: Skin is warm and dry.  Psychiatric: Judgment and thought content normal. Her mood appears anxious. Her speech is delayed. She is slowed. Cognition and memory are impaired. She exhibits a depressed mood.  Patient appears as an acute and chronically sick woman in a hospital bed. Cooperative with the interview. Her thoughts do tend to ramble and she loses her train of thought easily but does not appear to be psychotic. No indication of dangerousness.    Review of Systems  Constitutional: Positive for malaise/fatigue.  HENT: Negative.   Eyes: Negative.   Respiratory: Negative.   Cardiovascular: Negative.   Gastrointestinal: Positive for abdominal pain.  Musculoskeletal: Negative.   Skin: Negative.   Neurological: Negative.   Psychiatric/Behavioral: Positive for depression. Negative for suicidal ideas, hallucinations and substance abuse. The patient is nervous/anxious and has insomnia.     Blood pressure 101/57, pulse 84, temperature 98.6 F (37 C), temperature source Oral, resp. rate 17, height 5' 2"  (1.575 m), weight 113.399 kg (250 lb), SpO2 87 %.Body mass index is 45.71 kg/(m^2).  General Appearance: Casual  Eye Contact::  Good  Speech:  Slow  Volume:  Decreased  Mood:  Anxious  Affect:  Blunt  Thought Process:  Circumstantial  Orientation:  Full (Time, Place, and Person)  Thought Content:  Negative  Suicidal Thoughts:  No  Homicidal Thoughts:  No  Memory:   Immediate;   Good Recent;   Fair Remote;   Fair  Judgement:  Intact  Insight:  Fair  Psychomotor Activity:  Decreased  Concentration:  Fair  Recall:  [employer] of Knowledge:Fair  Language: Fair  Akathisia:  No  Handed:  Right  AIMS (if indicated):     Assets:  Communication Skills Desire for Improvement Social Support  ADL's:  Impaired  Cognition: WNL  Sleep:      Medical Decision Making: Review or order clinical lab tests (1), Established Problem, Worsening (2), Review or order medicine tests (1) and Review of Medication Regimen & Side Effects (2)  Treatment Plan Summary: Medication management and Plan this patient clearly has chronic anxiety and mood issues and has been maintained on high doses of benzodiazepine's for years. She really does have a remarkable number of major stresses to worry about with her own health not being the least of these. I don't see any obvious indication in her old record of concern about substance abuse problem although she is on chronic controlled substances. I suspect that any dramatic complaints or worsening some of her pain or irritability that may be observed are not meant to be manipulative but are probably a genuine reflection of the distress that she feels. I don't have any suggestion that there be any change to her medication. I would manage her pain without significant concern about her being abusive of pain medicine. She will need to make sure that she has some kind of arrangement for follow-up chronic pain medicine outside the hospital. I'm sure that it would do her good if her youngest son were not to come around while she is in the hospital as he clearly upsets her. I suggested this to her and she was ambivalent about it. No other change to treatment plan. No indication for psychiatric hospitalization. Supportive therapy and counseling completed  Plan:  No evidence of imminent risk to self or others at present.   Supportive therapy provided about  ongoing stressors. Discussed crisis plan, support from social network, calling 911, coming to the Emergency Department, and calling Suicide Hotline. Disposition: continue monitoring while she is in the hospital no current change to medication  Alethia Berthold 08/05/2014 8:57 PM

## 2014-08-05 NOTE — Progress Notes (Signed)
Patient states she is checking her own spo2 and placing 2 liter nasal cannula on when she sees fit.

## 2014-08-05 NOTE — Progress Notes (Signed)
Pt appears to be more alert and oriented today.  Tearful this AM.  Pt stated that she is worried about her younger son b/c he "took her car keys last night and went to sleep in the car."  She feels as though he has taken all her medication including pain medications that are in the car.  The older son was visiting her this AM and stated that he noticed the younger son in the car and he could not wake him up.  The mother was concerned about this.  Once the older son left to go back downstairs to check on the younger son, the younger son appeared in the pts room.  The younger son was slurring his words and and stumbling and could barely stay awake. The pt started arguing with him about taking "her" medication.  Although BP's remain lower, pt is asymptomatic.  Pt continues to have pain control issues.  MD aware of this.  Psych consult requested.  Will continue to monitor.

## 2014-08-05 NOTE — Progress Notes (Signed)
Key Points: Use following P&T approved IV to PO antibiotic change policy.  PHARMACIST - PHYSICIAN COMMUNICATION  CONCERNING: IV to Oral Route Change Policy  RECOMMENDATION: This patient is receiving pantoprazole by the intravenous route.  Based on criteria approved by the Pharmacy and Therapeutics Committee, the intravenous medication(s) is/are being converted to the equivalent oral dose form(s).   DESCRIPTION: These criteria include:  The patient is eating (either orally or via tube) and/or has been taking other orally administered medications for a least 24 hours  The patient has no evidence of active gastrointestinal bleeding or impaired GI absorption (gastrectomy, short bowel, patient on TNA or NPO).  If you have questions about this conversion, please contact the Pharmacy Department  []   [phone] )  [facility] [x]   [phone] )  [facility] []   [phone] )  [facility] []   [phone] )  [facility] []   [phone] )  [facility], [facility] 08/05/2014 7:30 AM

## 2014-08-05 NOTE — Progress Notes (Signed)
Per Dr. Algernon Huxley permission it is okay for pt to go downstairs to visit husband in [address] as long as she is accompanied by staff.

## 2014-08-05 NOTE — Progress Notes (Signed)
Patient ID: Lori Mckenzie, female   DOB: 1965/07/28, 49 y.o.   MRN: [id number]   She seems to be tolerating the drainage without difficulty. She had some low blood pressure last night. This is a secondary to narcotics. I discontinued her Lasix and hydrochlorothiazide today. I have asked internal medicine see her.  Her abdomen is soft. Her pain seems well out of proportion to her surgical process.  I believe a psychiatry consultation is in order.  It is unclear to me whether there is substance abuse issues in the family from speaking with the nurse.  Drainage remained serosanguineous. There is no bile seen within the drain.  I will stop her intravenous Zosyn and transitioned her to Augmentin and all oral narcotics. Perhaps psychiatry will have some insight into her pain perception.

## 2014-08-06 ENCOUNTER — Inpatient Hospital Stay: Payer: Medicaid Other

## 2014-08-06 DIAGNOSIS — I878 Other specified disorders of veins: Secondary | ICD-10-CM

## 2014-08-06 DIAGNOSIS — I959 Hypotension, unspecified: Secondary | ICD-10-CM

## 2014-08-06 DIAGNOSIS — R52 Pain, unspecified: Secondary | ICD-10-CM

## 2014-08-06 DIAGNOSIS — A419 Sepsis, unspecified organism: Secondary | ICD-10-CM

## 2014-08-06 DIAGNOSIS — R6520 Severe sepsis without septic shock: Secondary | ICD-10-CM

## 2014-08-06 LAB — LACTIC ACID, PLASMA: Lactic Acid, Venous: 1.3 mmol/L (ref 0.5–2.0)

## 2014-08-06 LAB — CBC
HCT: 28.3 % — ABNORMAL LOW (ref 35.0–47.0)
Hemoglobin: 9.4 g/dL — ABNORMAL LOW (ref 12.0–16.0)
MCH: 31.1 pg (ref 26.0–34.0)
MCHC: 33.2 g/dL (ref 32.0–36.0)
MCV: 93.7 fL (ref 80.0–100.0)
Platelets: 181 10*3/uL (ref 150–440)
RBC: 3.02 MIL/uL — ABNORMAL LOW (ref 3.80–5.20)
RDW: 14.1 % (ref 11.5–14.5)
WBC: 5.9 10*3/uL (ref 3.6–11.0)

## 2014-08-06 LAB — BASIC METABOLIC PANEL
Anion gap: 7 (ref 5–15)
BUN: 7 mg/dL (ref 6–20)
CO2: 27 mmol/L (ref 22–32)
Calcium: 7.7 mg/dL — ABNORMAL LOW (ref 8.9–10.3)
Chloride: 104 mmol/L (ref 101–111)
Creatinine, Ser: 0.55 mg/dL (ref 0.44–1.00)
GFR calc Af Amer: >60 mL/min (ref >60)
GFR calc non Af Amer: >60 mL/min (ref >60)
Glucose, Bld: 88 mg/dL (ref 65–99)
Potassium: 3.6 mmol/L (ref 3.5–5.1)
Sodium: 138 mmol/L (ref 135–145)

## 2014-08-06 LAB — GLUCOSE, CAPILLARY
Glucose-Capillary: 103 mg/dL — ABNORMAL HIGH (ref 65–99)
Glucose-Capillary: 112 mg/dL — ABNORMAL HIGH (ref 65–99)
Glucose-Capillary: 123 mg/dL — ABNORMAL HIGH (ref 65–99)
Glucose-Capillary: 123 mg/dL — ABNORMAL HIGH (ref 65–99)
Glucose-Capillary: 90 mg/dL (ref 65–99)
Glucose-Capillary: 99 mg/dL (ref 65–99)

## 2014-08-06 LAB — MRSA PCR SCREENING: MRSA by PCR: NEGATIVE

## 2014-08-06 LAB — CYTOLOGY - NON PAP

## 2014-08-06 LAB — PROCALCITONIN: Procalcitonin: 1.96 ng/mL

## 2014-08-06 MED ORDER — ENOXAPARIN SODIUM 40 MG/0.4ML ~~LOC~~ SOLN
40.0000 mg | Freq: Two times a day (BID) | SUBCUTANEOUS | Status: DC
  Administered 2014-08-06 – 2014-08-07 (×3): 40 mg via SUBCUTANEOUS
  Filled 2014-08-06 (×4): qty 0.4

## 2014-08-06 MED ORDER — SODIUM CHLORIDE 0.9 % IV SOLN
3.0000 g | Freq: Four times a day (QID) | INTRAVENOUS | Status: DC
  Administered 2014-08-06 – 2014-08-08 (×8): 3 g via INTRAVENOUS
  Filled 2014-08-06 (×15): qty 3

## 2014-08-06 MED ORDER — IOHEXOL 240 MG/ML SOLN
50.0000 mL | INTRAMUSCULAR | Status: AC
  Administered 2014-08-06 (×2): 50 mL via ORAL

## 2014-08-06 MED ORDER — NOREPINEPHRINE BITARTRATE 1 MG/ML IV SOLN: 0.0000 ug/min | INTRAVENOUS | Status: DC

## 2014-08-06 MED ORDER — SODIUM CHLORIDE 0.9 % IV BOLUS (SEPSIS)
1000.0000 mL | Freq: Once | INTRAVENOUS | Status: AC
  Administered 2014-08-06: 1000 mL via INTRAVENOUS

## 2014-08-06 MED ORDER — OXYCODONE-ACETAMINOPHEN 5-325 MG PO TABS
2.0000 | ORAL_TABLET | Freq: Four times a day (QID) | ORAL | Status: DC | PRN
  Administered 2014-08-06 – 2014-08-07 (×2): 2 via ORAL
  Filled 2014-08-06 (×2): qty 2

## 2014-08-06 MED ORDER — KCL IN DEXTROSE-NACL 20-5-0.45 MEQ/L-%-% IV SOLN
INTRAVENOUS | Status: DC
  Administered 2014-08-06 – 2014-08-08 (×3): via INTRAVENOUS
  Filled 2014-08-06 (×6): qty 1000

## 2014-08-06 MED ORDER — AMOXICILLIN-POT CLAVULANATE 875-125 MG PO TABS: 1.0000 | ORAL_TABLET | Freq: Two times a day (BID) | ORAL | Status: DC

## 2014-08-06 MED ORDER — FENTANYL CITRATE (PF) 100 MCG/2ML IJ SOLN
25.0000 ug | INTRAMUSCULAR | Status: DC | PRN
  Administered 2014-08-06 – 2014-08-07 (×3): 25 ug via INTRAVENOUS
  Administered 2014-08-07: 50 ug via INTRAVENOUS
  Administered 2014-08-07: 25 ug via INTRAVENOUS
  Filled 2014-08-06 (×5): qty 2

## 2014-08-06 MED ORDER — ALPRAZOLAM 1 MG PO TABS: 1.0000 mg | ORAL_TABLET | Freq: Three times a day (TID) | ORAL | Status: DC | PRN

## 2014-08-06 MED ORDER — LIDOCAINE HCL (PF) 1 % IJ SOLN
0.0000 mL | Freq: Once | INTRAMUSCULAR | Status: AC | PRN
  Filled 2014-08-06: qty 30

## 2014-08-06 MED ORDER — OXYCODONE-ACETAMINOPHEN 5-325 MG PO TABS: 2.0000 | ORAL_TABLET | Freq: Four times a day (QID) | ORAL | Status: DC | PRN

## 2014-08-06 MED ORDER — NOREPINEPHRINE 4 MG/250ML-% IV SOLN
0.0000 ug/min | INTRAVENOUS | Status: DC
  Filled 2014-08-06: qty 250

## 2014-08-06 MED ORDER — FENTANYL CITRATE (PF) 100 MCG/2ML IJ SOLN
50.0000 ug | Freq: Once | INTRAMUSCULAR | Status: AC
  Administered 2014-08-06: 50 ug via INTRAVENOUS
  Filled 2014-08-06: qty 2

## 2014-08-06 MED ORDER — IOHEXOL 350 MG/ML SOLN
100.0000 mL | Freq: Once | INTRAVENOUS | Status: AC | PRN
  Administered 2014-08-06: 100 mL via INTRAVENOUS

## 2014-08-06 NOTE — Progress Notes (Signed)
Patient ID: Lori Mckenzie, female   DOB: 20-Mar-1965, 49 y.o.   MRN: [id number]  Surgery  She is complaining of less pain. Blood pressure is been an issue. Nurse reports she has been lethargic with oral narcotics.  He is tolerating a regular diet.  Temp 101  Bp 90's    Physical examination demonstrate patient will obvious distress. There is no bile seen within the drain. It is dark serosanguineous fluid. Her abdomen is soft and otherwise nontender.  Labs  CBC Latest Ref Rng 08/05/2014 08/04/2014 08/03/2014  WBC 3.6 - 11.0 K/uL 5.2 10.4 9.7  Hemoglobin 12.0 - 16.0 g/dL 10.0(L) 9.1(L) 10.6(L)  Hematocrit 35.0 - 47.0 % 30.4(L) 27.1(L) 32.3(L)  Platelets 150 - 440 K/uL 158 167 192   CMP Latest Ref Rng 08/06/2014 08/04/2014 08/03/2014  Glucose 65 - 99 mg/dL 88 122(H) 166(H)  BUN 6 - 20 mg/dL 7 7 10   Creatinine 0.44 - 1.00 mg/dL 0.55 0.65 0.65  Sodium 135 - 145 mmol/L 138 137 137  Potassium 3.5 - 5.1 mmol/L 3.6 3.4(L) 3.5  Chloride 101 - 111 mmol/L 104 97(L) 94(L)  CO2 22 - 32 mmol/L 27 31 32  Calcium 8.9 - 10.3 mg/dL 7.7(L) 7.8(L) 8.3(L)  Total Protein 6.5 - 8.1 g/dL - 6.5 7.3  Total Bilirubin 0.3 - 1.2 mg/dL - 0.9 0.9  Alkaline Phos 38 - 126 U/L - 54 59  AST 15 - 41 U/L - 20 26  ALT 14 - 54 U/L - 11(L) 13(L)     IMP:  This 49 year old female with multiple psychiatric issues and difficulty in controlling pain with hypotension secondary to narcotics and likely ongoing infection.  Plan appreciate infectious disease input. W We will continue Jackson-Pratt drain. I discontinued her MS Contin and MSIR and switched her over to Percocet.   A Liter bolus was given this morning. We will consult home health and case management for home drain management.

## 2014-08-06 NOTE — Progress Notes (Signed)
Patient ID: Lori Mckenzie, female   DOB: 01/07/1965, 49 y.o.   MRN: [id number]   Events of the day noted.   I was made aware of worsening lethargy along with continued low blood pressure with her plan to move patient  to CCU for more intense monitoring and sepsis protocol by Dr Volanda Napoleon earlier this afternoon.   Examination of the patient demonstrates an abdominal exam which is consistent with the exam from this morning. There are no peritoneal signs present. JP is present in the RUQ.  Heart rate is in the 80s to 90s blood pressure is running 95/55  She is awake and alert.  Consult has been placed to both infectious disease with him which I have spoken as well as critical care medicine.  I suspect that the patient is either developed a pneumonia on top of her existing RUQ process and/or the catheter is no longer draining the RUQ fluid collection. At any rate a CT scan of the abdomen/pelvis and chest should delineate source of fever and help direct further treatments.   CBC Latest Ref Rng 08/05/2014 08/04/2014 08/03/2014  WBC 3.6 - 11.0 K/uL 5.2 10.4 9.7  Hemoglobin 12.0 - 16.0 g/dL 10.0(L) 9.1(L) 10.6(L)  Hematocrit 35.0 - 47.0 % 30.4(L) 27.1(L) 32.3(L)  Platelets 150 - 440 K/uL 158 167 192    BMP Latest Ref Rng 08/06/2014 08/04/2014 08/03/2014  Glucose 65 - 99 mg/dL 88 122(H) 166(H)  BUN 6 - 20 mg/dL 7 7 10   Creatinine 0.44 - 1.00 mg/dL 0.55 0.65 0.65  Sodium 135 - 145 mmol/L 138 137 137  Potassium 3.5 - 5.1 mmol/L 3.6 3.4(L) 3.5  Chloride 101 - 111 mmol/L 104 97(L) 94(L)  CO2 22 - 32 mmol/L 27 31 32  Calcium 8.9 - 10.3 mg/dL 7.7(L) 7.8(L) 8.3(L)    Plan: once adequate intravenous access is administered. I would recommend a contrasted CT scan of the chest, abdomen and pelvis tonight. Her oral antibiotics were switched to intravenous Unasyn.  We may need to add intravenous vancomycin. I will allow that decision be made by infectious disease consultation.

## 2014-08-06 NOTE — Consult Note (Signed)
**Note Lori-Identified via Obfuscation** [facility]     Reason for Consult: Fever, abd abscess   Referring Physician: Myrtis Ser Date of Admission:  08/03/2014   Active Problems:   Anxiety   Abscess of abdominal cavity   Dysthymia   Other social stressor   HPI: Lori Mckenzie is a 49 y.o. female who was recently discharged on 7/27 following a prolonged hospital course for biliary leak following choelcystectomy who presented with 2 days of worsening abdominal pain, fever to 101.9 at home and emesis. On admission she had CT done with large fluid collection in biliary fossa. Underwent IR aspiration of 300 cc purulent fluid removed and drain placed 8/1.  Has been on  IV zosyn since admission. Culture grew Klebsiella and GB strep. Over last 24 hours has had recurrent fever and hypotension. BP meds and pain meds have been held.  Currently reports continued abd pain and chills. Is rigoring currently.  BP has repsonded some to 2 L NS.  Past Medical History  Diagnosis Date  . Brittle bone disease   . Asthma   . Thyroid disease   . Hypertension   . Back pain   . Diabetes mellitus without complication   . Chronic kidney disease   . Anxiety   . GERD (gastroesophageal reflux disease)   . Sleep apnea   . COPD (chronic obstructive pulmonary disease)   . Hypothyroidism   . Cervical disc disease   . TIA (transient ischemic attack)   . Cancer     Uteriine  ca 10yr ago partial hysterectomy  . Collagen vascular disease     RA  3-4 yrs ago   Past Surgical History  Procedure Laterality Date  . Abdominal hysterectomy    . Tubal ligation    . Colonoscopy with propofol N/A 06/23/2014    Procedure: COLONOSCOPY WITH PROPOFOL;  Surgeon: MLollie Sails MD;  [facility];  Service: Endoscopy;  Laterality: N/A;  . Esophagogastroduodenoscopy N/A 06/23/2014    Procedure: ESOPHAGOGASTRODUODENOSCOPY (EGD);  Surgeon: MLollie Sails MD;  [facility];  Service: Endoscopy;  Laterality: N/A;   . Cholecystectomy N/A 07/15/2014    Procedure: LAPAROSCOPIC CHOLECYSTECTOMY with liver biopsy ;  Surgeon: MSherri Rad MD;  Location: [facility] ORS;  Service: General;  Laterality: N/A;  . Ercp N/A 07/16/2014    Procedure: ENDOSCOPIC RETROGRADE CHOLANGIOPANCREATOGRAPHY (ERCP);  Surgeon: MClarene Essex MD;  [facility];  Service: Endoscopy;  Laterality: N/A;   History  Substance Use Topics  . Smoking status: Never Smoker   . Smokeless tobacco: Not on file  . Alcohol Use: No     Comment: occ   Family History  Problem Relation Age of Onset  . Lung cancer Father   . Ulcers Father   . Heart disease Sister   . Ulcers Sister   . Heart disease Brother     Allergies:  Allergies  Allergen Reactions  . Hydrocodone Itching    Other reaction(s): ITCHING  . Vicodin [Hydrocodone-Acetaminophen] Rash    Current antibiotics: Antibiotics Given (last 72 hours)    Date/Time Action Medication Dose Rate   08/03/14 2328 Given   piperacillin-tazobactam (ZOSYN) IVPB 3.375 g 3.375 g 12.5 mL/hr   08/04/14 0651 Given   piperacillin-tazobactam (ZOSYN) IVPB 3.375 g 3.375 g 12.5 mL/hr   08/04/14 1528 Given   piperacillin-tazobactam (ZOSYN) IVPB 3.375 g 3.375 g 12.5 mL/hr   08/04/14 2230 Given   piperacillin-tazobactam (ZOSYN) IVPB 3.375 g 3.375 g 12.5 mL/hr   08/05/14 0503 Given  piperacillin-tazobactam (ZOSYN) IVPB 3.375 g 3.375 g 12.5 mL/hr   08/05/14 1055 Given   amoxicillin-clavulanate (AUGMENTIN) 875-125 MG per tablet 1 tablet 1 tablet    08/05/14 2128 Given   amoxicillin-clavulanate (AUGMENTIN) 875-125 MG per tablet 1 tablet 1 tablet    08/06/14 0953 Given   amoxicillin-clavulanate (AUGMENTIN) 875-125 MG per tablet 1 tablet 1 tablet       MEDICATIONS: . ampicillin-sulbactam (UNASYN) IV  3 g Intravenous Q6H  . citalopram  20 mg Oral Daily  . insulin aspart  0-9 Units Subcutaneous 6 times per day  . ipratropium  0.5 mg Inhalation TID  . metFORMIN  500 mg Oral Q breakfast  .  mometasone-formoterol  2 puff Inhalation BID  . pantoprazole  40 mg Oral QHS    Review of Systems - 11 systems reviewed and negative per HPI   OBJECTIVE: Temp:  [98.4 F (36.9 C)-101.2 F (38.4 C)] 98.4 F (36.9 C) (08/03 0740) Pulse Rate:  [77-95] 79 (08/03 1318) Resp:  [16-18] 17 (08/03 0740) BP: (82-101)/(46-64) 93/59 mmHg (08/03 1318) SpO2:  [87 %-100 %] 94 % (08/03 0740) Physical Exam  Constitutional:  oriented to person, place, and time. Obese, rigoring HENT: [place]/AT, PERRLA, no scleral icterus Mouth/Throat: Oropharynx is clear and moist. No oropharyngeal exudate.  Cardiovascular: Normal rate, regular rhythm and normal heart sounds. Pulmonary/Chest: Effort normal and breath sounds normal. No respiratory distress.  has no wheezes.  Neck  supple, no nuchal rigidity Abdominal: distended, has drain RUQ with some ss fluid. abd mod ttp RUQ Lymphadenopathy: no cervical adenopathy. No axillary adenopathy Neurological: alert and oriented to person, place, and time.  Skin: Skin is warm and dry. No rash noted. No erythema.  Psychiatric: a normal mood and affect.  behavior is normal.    LABS: Results for orders placed or performed during the hospital encounter of 08/03/14 (from the past 48 hour(s))  Glucose, capillary     Status: Abnormal   Collection Time: 08/04/14  5:05 PM  Result Value Ref Range   Glucose-Capillary 160 (H) 65 - 99 mg/dL  Glucose, capillary     Status: Abnormal   Collection Time: 08/04/14  8:47 PM  Result Value Ref Range   Glucose-Capillary 147 (H) 65 - 99 mg/dL  Glucose, capillary     Status: Abnormal   Collection Time: 08/05/14 12:12 AM  Result Value Ref Range   Glucose-Capillary 137 (H) 65 - 99 mg/dL  Glucose, capillary     Status: Abnormal   Collection Time: 08/05/14  4:34 AM  Result Value Ref Range   Glucose-Capillary 101 (H) 65 - 99 mg/dL  Glucose, capillary     Status: Abnormal   Collection Time: 08/05/14  7:43 AM  Result Value Ref Range    Glucose-Capillary 105 (H) 65 - 99 mg/dL   Comment 1 Notify RN   CBC     Status: Abnormal   Collection Time: 08/05/14  9:52 AM  Result Value Ref Range   WBC 5.2 3.6 - 11.0 K/uL   RBC 3.22 (L) 3.80 - 5.20 MIL/uL   Hemoglobin 10.0 (L) 12.0 - 16.0 g/dL   HCT 30.4 (L) 35.0 - 47.0 %   MCV 94.4 80.0 - 100.0 fL   MCH 31.0 26.0 - 34.0 pg   MCHC 32.8 32.0 - 36.0 g/dL   RDW 14.2 11.5 - 14.5 %   Platelets 158 150 - 440 K/uL  Glucose, capillary     Status: Abnormal   Collection Time: 08/05/14 11:44 AM  Result  Value Ref Range   Glucose-Capillary 125 (H) 65 - 99 mg/dL   Comment 1 Notify RN   Magnesium     Status: None   Collection Time: 08/05/14 12:25 PM  Result Value Ref Range   Magnesium 2.1 1.7 - 2.4 mg/dL  Glucose, capillary     Status: None   Collection Time: 08/05/14  5:01 PM  Result Value Ref Range   Glucose-Capillary 94 65 - 99 mg/dL   Comment 1 Notify RN   Glucose, capillary     Status: Abnormal   Collection Time: 08/05/14  8:06 PM  Result Value Ref Range   Glucose-Capillary 105 (H) 65 - 99 mg/dL  Glucose, capillary     Status: None   Collection Time: 08/05/14 11:43 PM  Result Value Ref Range   Glucose-Capillary 80 65 - 99 mg/dL   Comment 1 Notify RN   Glucose, capillary     Status: None   Collection Time: 08/06/14  4:29 AM  Result Value Ref Range   Glucose-Capillary 99 65 - 99 mg/dL  Glucose, capillary     Status: Abnormal   Collection Time: 08/06/14  7:28 AM  Result Value Ref Range   Glucose-Capillary 123 (H) 65 - 99 mg/dL   Comment 1 Notify RN   Glucose, capillary     Status: Abnormal   Collection Time: 08/06/14 12:05 PM  Result Value Ref Range   Glucose-Capillary 112 (H) 65 - 99 mg/dL   Comment 1 Notify RN    No components found for: ESR, C REACTIVE PROTEIN MICRO: Recent Results (from the past 720 hour(s))  Culture, routine-abscess     Status: None (Preliminary result)   Collection Time: 08/04/14 12:13 PM  Result Value Ref Range Status   Specimen Description  ABDOMEN  Final   Special Requests Normal  Final   Gram Stain   Final    MODERATE WBC SEEN MANY GRAM POSITIVE COCCI MODERATE GRAM NEGATIVE RODS    Culture   Final    LIGHT GROWTH KLEBSIELLA OXYTOCA LIGHT GROWTH GROUP B STREP(S.AGALACTIAE)ISOLATED Virtually 100% of S. agalactiae (Group B) strains are susceptible to Penicillin.  For Penicillin-allergic patients, Erythromycin (85-95% sensitive) and Clindamycin (80% sensitive) are drugs of choice. Contact microbiology lab to request sensitivities if  needed within 7 days.    Report Status PENDING  Incomplete   Organism ID, Bacteria KLEBSIELLA OXYTOCA  Final      Susceptibility   Klebsiella oxytoca - MIC*    AMPICILLIN >=32 RESISTANT Resistant     CEFAZOLIN <=4 SENSITIVE Sensitive     CEFTRIAXONE <=1 SENSITIVE Sensitive     CIPROFLOXACIN <=0.25 SENSITIVE Sensitive     GENTAMICIN <=1 SENSITIVE Sensitive     IMIPENEM <=0.25 SENSITIVE Sensitive     NITROFURANTOIN <=16 SENSITIVE Sensitive     TRIMETH/SULFA <=20 SENSITIVE Sensitive     PIP/TAZO Value in next row Sensitive      SENSITIVE<=4    AMPICILLIN/SULBACTAM Value in next row Sensitive      SENSITIVE8    * LIGHT GROWTH KLEBSIELLA OXYTOCA  Anaerobic culture     Status: None (Preliminary result)   Collection Time: 08/04/14 12:13 PM  Result Value Ref Range Status   Specimen Description ABSCESS  Final   Special Requests NONE  Final   Culture HOLDING FOR POSSIBLE ANAEROBE  Final   Report Status PENDING  Incomplete    IMAGING: Dg Chest 1 View  07/18/2014   CLINICAL DATA:  Asthma with rales  EXAM: CHEST  1  VIEW  COMPARISON:  November 15, 2013  FINDINGS: There is no edema or consolidation. Heart is upper normal in size with pulmonary vascularity within normal limits. No adenopathy. No bone lesions.  IMPRESSION: No edema or consolidation.   Electronically Signed   By: Lowella Grip III M.D.   On: 07/18/2014 07:19   Nm Hepatobiliary Liver Func  07/16/2014   CLINICAL DATA:  Right upper  quadrant abdominal pain with drainage from abdominal wounds status post cholecystectomy 2 days ago. Evaluate for bile leak.  EXAM: NUCLEAR MEDICINE HEPATOBILIARY IMAGING  TECHNIQUE: Sequential images of the abdomen were obtained out to 60 minutes following intravenous administration of radiopharmaceutical.  RADIOPHARMACEUTICALS:  5.45 mCi Tc-68m Choletec IV  COMPARISON:  Preoperative MRI 07/13/2014.  FINDINGS: Initial images demonstrate homogeneous hepatic activity. There is prompt opacification of the biliary system and small bowel. There is progressive accumulation of activity within the cholecystectomy bed with extension into the subhepatic space consistent with a bile leak. Some of this activity may be within a surgical drain if present. There is also accumulation of activity superior to the right hepatic lobe.  IMPRESSION: Findings are consistent with a bile leak with accumulation of activity in the cholecystectomy bed, subhepatic space and superior to the right hepatic lobe. ERCP for biliary stenting recommended.  These results will be called to the ordering clinician or representative by the Radiologist Assistant, and communication documented in the PACS or zVision Dashboard.   Electronically Signed   By: WRichardean SaleM.D.   On: 07/16/2014 10:37   UKoreaTransvaginal Non-ob  07/10/2014   CLINICAL DATA:  Follow-up of ovarian cysts seen on CT scan of May 29, 2014, history of hysterectomy 28 years ago.  EXAM: TRANSABDOMINAL AND TRANSVAGINAL ULTRASOUND OF PELVIS  TECHNIQUE: Both transabdominal and transvaginal ultrasound examinations of the pelvis were performed. Transabdominal technique was performed for global imaging of the pelvis including uterus, ovaries, adnexal regions, and pelvic cul-Lori-sac. It was necessary to proceed with endovaginal exam following the transabdominal exam to visualize the endometrium and adnexal structures.  COMPARISON:  CT scan of the abdomen and pelvis of May 29, 2014.  FINDINGS:  Uterus  The uterus is surgically absent.  Right ovary  Measurements: 3.3 x 1.9 x 2.3 cm. There is a septated right ovarian cystic structure which measures 1.8 x 1.5 x 1.7 cm.  Left ovary  Measurements: 4.1 x 2.4 x 2.5 cm. There is an ovarian cyst measuring 1.8 cm in greatest dimension. There is a subtle focus of heterogeneously increased echotexture measuring 3 x 1.7 x 1.9 cm.  Other findings  There is no free pelvic fluid.  IMPRESSION: The uterus is surgically absent. The right ovary contains a cyst measuring 1.8 cm in greatest dimension. On the left the ovary contains a cyst measuring up to 1.8 cm, and there is a complex structure within the left ovary measuring 3 x 1.7 x 1.9 cm. This was not evident on the previous study. Pelvic MRI is recommended.   Electronically Signed   By: David  JMartiniqueM.D.   On: 07/10/2014 13:45   UKoreaPelvis Complete  07/10/2014   CLINICAL DATA:  Follow-up of ovarian cysts seen on CT scan of May 29, 2014, history of hysterectomy 28 years ago.  EXAM: TRANSABDOMINAL AND TRANSVAGINAL ULTRASOUND OF PELVIS  TECHNIQUE: Both transabdominal and transvaginal ultrasound examinations of the pelvis were performed. Transabdominal technique was performed for global imaging of the pelvis including uterus, ovaries, adnexal regions, and pelvic cul-Lori-sac. It was necessary  to proceed with endovaginal exam following the transabdominal exam to visualize the endometrium and adnexal structures.  COMPARISON:  CT scan of the abdomen and pelvis of May 29, 2014.  FINDINGS: Uterus  The uterus is surgically absent.  Right ovary  Measurements: 3.3 x 1.9 x 2.3 cm. There is a septated right ovarian cystic structure which measures 1.8 x 1.5 x 1.7 cm.  Left ovary  Measurements: 4.1 x 2.4 x 2.5 cm. There is an ovarian cyst measuring 1.8 cm in greatest dimension. There is a subtle focus of heterogeneously increased echotexture measuring 3 x 1.7 x 1.9 cm.  Other findings  There is no free pelvic fluid.  IMPRESSION: The  uterus is surgically absent. The right ovary contains a cyst measuring 1.8 cm in greatest dimension. On the left the ovary contains a cyst measuring up to 1.8 cm, and there is a complex structure within the left ovary measuring 3 x 1.7 x 1.9 cm. This was not evident on the previous study. Pelvic MRI is recommended.   Electronically Signed   By: David  Martinique M.D.   On: 07/10/2014 13:45   Ct Abdomen Pelvis W Contrast  08/03/2014   CLINICAL DATA:  CT-guided drainage done 07/23/2014. Fevers, nausea, vomiting. Severe abdominal pain. Recent gallbladder surgery.  EXAM: CT ABDOMEN AND PELVIS WITH CONTRAST  TECHNIQUE: Multidetector CT imaging of the abdomen and pelvis was performed using the standard protocol following bolus administration of intravenous contrast.  CONTRAST:  110m OMNIPAQUE IOHEXOL 300 MG/ML  SOLN  COMPARISON:  07/22/2014  FINDINGS: Lung bases are clear.  No effusions.  Heart is normal size.  Previously placed drainage catheter is no longer present. The gas and fluid collection within the gallbladder fossa has enlarged, now measuring 9.1 x 7.0 cm. This is compatible with enlarging gallbladder fossa abscess.  Small cysts scattered throughout the liver. Spleen for is enlarged with a craniocaudal length of 21 cm. Stable hepatomegaly. Stent within the distal common bile duct and extending into the duodenum is unchanged. Pancreas, adrenals, kidneys are unremarkable.  Small amount of free fluid in the pelvis. Stomach, large and small bowel are unremarkable. Urinary bladder unremarkable.  IMPRESSION: Enlarging gas and fluid collection within the gallbladder fossa now measuring up to 9.1 cm concerning for postoperative abscess.  Stable hepatosplenomegaly.   Electronically Signed   By: KRolm BaptiseM.D.   On: 08/03/2014 16:26   Ct Abdomen Pelvis W Contrast  07/22/2014   CLINICAL DATA:  49year old female post laparoscopic cholecystectomy 07/16/2014 found to have bile leak treated with stent placement. Still  experiencing pain. Subsequent encounter.  EXAM: CT ABDOMEN AND PELVIS WITH CONTRAST  TECHNIQUE: Multidetector CT imaging of the abdomen and pelvis was performed using the standard protocol following bolus administration of intravenous contrast.  CONTRAST:  1028mOMNIPAQUE IOHEXOL 300 MG/ML  SOLN  COMPARISON:  07/16/2014 ERCP. 07/13/2014 MR abdomen. 05/29/2014 CT abdomen and pelvis.  FINDINGS: Post cholecystectomy. Extending from the gallbladder fossa/operative site anteriorly and inferiorly is loculated 7.9 x 6.2 x 6.3 cm fluid and gas collection. This is consistent with bile leak. The presence of gas may reflect changes of an abscess as versus injected gas from stent placement. A percutaneous drain has been placed from the right. The tip of the drain is inferior and posterior to majority of the above described postoperative collection. Correlation with drainage amount recommended to determine if this needs to be repositioned.  Stent extends from the distal common bile duct through the pancreatic head into the second  portion of the duodenum. Common bile duct measures up to 8.5 mm. Minimal prominence major intrahepatic biliary ducts without branch vessel dilation.  Enlarged liver spanning over 23 cm containing cysts. Splenomegaly spanning over 18.6 cm length. Portal vein and prominent splenic veins are patent.  Peripancreatic adenopathy with shotty intra-aortic caval lymph nodes of questionable etiology.  Small amount of free fluid in the pelvis may be related to the bile leak.  No definitive primary bowel inflammatory process.  Subcutaneous changes from surgery and STIR percutaneous catheter noted.  No worrisome pancreatic, adrenal or renal lesion.  Mild calcification lower abdominal aorta are consistent with slightly age advanced atherosclerotic type changes. No abdominal aortic aneurysm.  Degenerative changes lower thoracic and lumbar spine with scattered protrusion some which are calcified. Largest protrusion  T10-11 level with mass effect upon the cord. This can be assessed with follow-up MR when patient is able. Spinal stenosis lower lumbar region. Facet joint degenerative changes.  Noncontrast filled views of the urinary bladder unremarkable. Post hysterectomy.  Bibasilar subsegmental atelectatic changes greater on the right.  Ascending thoracic aorta measures 4.2 cm. Recommend annual imaging followup by CTA or MRA. This recommendation follows 2010 ACCF/AHA/AATS/ACR/ASA/SCA/SCAI/SIR/STS/SVM Guidelines for the Diagnosis and Management of Patients with Thoracic Aortic Disease. Circulation. 2010; 121: D176-H607.  IMPRESSION: Post cholecystectomy. Extending from the gallbladder fossa/operative site anteriorly and inferiorly is loculated 7.9 x 6.2 x 6.3 cm fluid and gas collection. This is consistent with bile leak. The presence of gas may reflect changes of an abscess as versus injected gas from stent placement. A percutaneous drain has been placed from the right. The tip of the drain is inferior and posterior to majority of the above described postoperative collection. Correlation with drainage amount recommended to determine if this needs to be repositioned.  Stent extends from the distal common bile duct through the pancreatic head into the second portion of the duodenum. Common bile duct measures up to 8.5 mm. Minimal prominence major intrahepatic biliary ducts without branch vessel dilation.  Enlarged liver spanning over 23 cm. Splenomegaly spanning over 18.6 cm length.  Peripancreatic adenopathy with shotty intra-aortic caval lymph nodes of questionable etiology.  Small amount of free fluid in the pelvis may be related to the bile leak.  Degenerative changes lower thoracic and lumbar spine with scattered protrusion some which are calcified. Largest protrusion T10-11 level with mass effect upon the cord. This can be assessed with follow-up MR when patient is able. Spinal stenosis lower lumbar region.  Ascending  thoracic aorta measures 4.2 cm. Recommend annual imaging followup by CTA or MRA. This recommendation follows 2010 ACCF/AHA/AATS/ACR/ASA/SCA/SCAI/SIR/STS/SVM Guidelines for the Diagnosis and Management of Patients with Thoracic Aortic Disease. Circulation. 2010; 121: P710-G269.   Electronically Signed   By: Genia Del M.D.   On: 07/22/2014 15:07   US Venous Img Upper Uni Left  07/19/2014   CLINICAL DATA:  49 year old female with left upper extremity swelling  EXAM: LEFT UPPER EXTREMITY VENOUS DOPPLER ULTRASOUND  TECHNIQUE: Gray-scale sonography with graded compression, as well as color Doppler and duplex ultrasound were performed to evaluate the upper extremity deep venous system from the level of the subclavian vein and including the jugular, axillary, basilic, radial, ulnar and upper cephalic vein. Spectral Doppler was utilized to evaluate flow at rest and with distal augmentation maneuvers.  COMPARISON:  None.  FINDINGS: Contralateral Subclavian Vein: Respiratory phasicity is normal and symmetric with the symptomatic side. No evidence of thrombus. Normal compressibility.  Internal Jugular Vein: No evidence of thrombus.  Normal compressibility, respiratory phasicity and response to augmentation.  Subclavian Vein: No evidence of thrombus. Normal compressibility, respiratory phasicity and response to augmentation.  Axillary Vein: No evidence of thrombus. Normal compressibility, respiratory phasicity and response to augmentation.  Cephalic Vein: No evidence of thrombus. Normal compressibility, respiratory phasicity and response to augmentation.  Basilic Vein: No evidence of thrombus. Normal compressibility, respiratory phasicity and response to augmentation.  Brachial Veins: No evidence of thrombus. Normal compressibility, respiratory phasicity and response to augmentation.  Radial Veins: No evidence of thrombus. Normal compressibility, respiratory phasicity and response to augmentation.  Ulnar Veins: No evidence  of thrombus. Normal compressibility, respiratory phasicity and response to augmentation.  Venous Reflux:  None visualized.  Other Findings:  None visualized.  IMPRESSION: No evidence of deep venous thrombosis.   Electronically Signed   By: Jacqulynn Cadet M.D.   On: 07/19/2014 12:16   Dg Ercp Biliary & Pancreatic Ducts  07/16/2014   CLINICAL DATA:  49 year old female with biliary leak status post cholecystectomy  EXAM: ERCP and stent placement  TECHNIQUE: Multiple spot images obtained with the fluoroscopic device and submitted for interpretation post-procedure.  FLUOROSCOPY TIME:  1 minutes 50 seconds fluoro time reported. Please see operative note for complete detail  COMPARISON:  Abdominal MRI 07/13/2014 ; nuclear medicine HIDA scan 07/16/2014  FINDINGS: A total of 6 intraoperative spot images demonstrate cannulation of the common bile duct and placement of a plastic biliary stent with decompression of the biliary tree. There appears to be some extravasation of contrast material possibly from the cystic duct remanent.  IMPRESSION: ERCP and placement of a plastic biliary stent.  There appears to be some extravasation of contrast material, possibly from the cystic duct remnant.  These images were submitted for radiologic interpretation only. Please see the procedural report for the amount of contrast and the fluoroscopy time utilized.   Electronically Signed   By: Jacqulynn Cadet M.D.   On: 07/16/2014 19:54   Mr Lambert Mody Cm/mrcp  07/13/2014   CLINICAL DATA:  Right upper quadrant/epigastric pain, nausea/vomiting, dilated common bile duct on ultrasound  EXAM: MRI ABDOMEN WITHOUT CONTRAST  (INCLUDING MRCP)  TECHNIQUE: Multiplanar multisequence MR imaging of the abdomen was performed. Heavily T2-weighted images of the biliary and pancreatic ducts were obtained, and three-dimensional MRCP images were rendered by post processing.  COMPARISON:  Right upper quadrant ultrasound dated 07/12/2014. CT abdomen pelvis  dated 05/29/2014.  FINDINGS: Lower chest:  Lung bases are clear.  Cardiomegaly.  Hepatobiliary: Scattered hepatic cysts, measuring up to 16 mm in the medial segment left hepatic lobe (series 3/image 11). Mild hepatic steatosis. No suspicious/enhancing hepatic lesions.  Gallbladder is moderately distended with layering gallbladder sludge (series 13/ image 14). No associated inflammatory changes.  No intrahepatic ductal dilatation. Common duct measures 9 mm (series 5/ image 19), similar to prior CT. No choledocholithiasis is seen.  Pancreas: Within normal limits.  Spleen: Enlarged, measuring 19.4 cm in craniocaudal dimension, previously 17.2 cm.  Adrenals/Urinary Tract: Adrenal glands are within normal limits.  Kidneys within normal limits.  No hydronephrosis.  Stomach/Bowel: Stomach and visualized bowel are grossly unremarkable.  Vascular/Lymphatic: No evidence of abdominal aortic aneurysm.  No suspicious abdominal lymphadenopathy.  Other: No abdominal ascites.  Musculoskeletal: No focal osseous lesions.  IMPRESSION: Mild gallbladder distention with layering gallbladder sludge. No associated inflammatory changes on MR.  No intrahepatic ductal dilatation. Common duct measures 9 mm, similar to prior CT. No choledocholithiasis is seen.  Mild hepatic steatosis. Scattered hepatic cysts, measuring up to  1.6 cm. No suspicious/enhancing hepatic lesions.  Splenomegaly, measuring 19.4 cm, increased.   Electronically Signed   By: Julian Hy M.D.   On: 07/13/2014 17:26   Ct Image Guided Drainage By Percutaneous Catheter  08/04/2014   INDICATION: History of bile leak following cholecystectomy, post CT-guided percutaneous drainage catheter placed on 07/23/2014. The percutaneous drainage catheter was subsequently removed, however the patient return to the hospital with recurrent gas and fluid collection within the gallbladder fossa worrisome for a a residual leak and/or abscess. Please perform CT-guided percutaneous  drainage catheter placement.  EXAM: CT IMAGE GUIDED DRAINAGE BY PERCUTANEOUS CATHETER  COMPARISON:  CT abdomen and pelvis - 08/03/2014; 07/22/2014; CT-guided percutaneous drainage catheter placement - 07/23/2014  MEDICATIONS: The patient is currently admitted to the hospital and receiving intravenous antibiotics. The antibiotics were administered within an appropriate time frame prior to the initiation of the procedure.  ANESTHESIA/SEDATION: Conscious sedation was achieved with intravenous Versed and Fentanyl  Total Moderate Sedation time  20 minutes  CONTRAST:  None  COMPLICATIONS: None immediate  PROCEDURE: Informed written consent was obtained from the patient after a discussion of the risks, benefits and alternatives to treatment. The patient was placed supine on the CT gantry and a pre procedural CT was performed re-demonstrating the known air-containing abscess/fluid collection within the gallbladder fossa with dominant component measuring approximately 8.5 x 7.1 cm (image 39, series 2). The procedure was planned. A timeout was performed prior to the initiation of the procedure.  The skin overlying the right upper abdominal quadrant was prepped and draped in the usual sterile fashion. The overlying soft tissues were anesthetized with 1% lidocaine with epinephrine. Appropriate trajectory was planned with the use of a 22 gauge spinal needle. Utilizing a trans hepatic approach, a 18 gauge trocar needle was advanced into the abscess/fluid collection and a short Amplatz super stiff wire was coiled within the collection. Appropriate positioning was confirmed with a limited CT scan. The tract was serially dilated allowing placement of a 10 [place] all-purpose drainage catheter. Appropriate positioning was confirmed with a limited postprocedural CT scan.  Approximately 300 Ml of purulent, foul smelling fluid was aspirated. The tube was connected to a drainage bag and sutured in place. A dressing was placed. The patient  tolerated the procedure well without immediate post procedural complication.  IMPRESSION: Successful CT guided placement of a 10 French all purpose drain catheter into the gallbladder fossa via a transhepatic approach with aspiration of 300 mL of purulent, foul smelling fluid. Samples were sent to the laboratory as requested by the ordering clinical team.   Electronically Signed   By: Sandi Mariscal M.D.   On: 08/04/2014 15:24   Ct Image Guided Drainage Percut Cath  Peritoneal Retroperit  07/23/2014   INDICATION: Postoperative fluid collection in right upper quadrant of abdomen.  EXAM: CT CORE BIOPSY RENAL  COMPARISON:  CT scan of July 22, 2014.  MEDICATIONS: Fentanyl 150 mcg IV; Versed 3.0 mg IV  ANESTHESIA/SEDATION: Total Moderate Sedation Time  35 minutes  CONTRAST:  None.  COMPLICATIONS: None immediate.  TECHNIQUE: Informed written consent was obtained from Lori Mckenzie, Lori Mckenzie after a discussion of the risks, benefits and alternatives to treatment. The patient was placed supine on the CT gantry and a pre procedural CT was performed re-demonstrating the known abscess/fluid collection within the subhepatic space. The procedure was planned. A timeout was performed prior to the initiation of the procedure.  The epigastric region was prepped and draped in the usual sterile fashion. The  overlying soft tissues were anesthetized with 1% lidocaine with epinephrine. Appropriate trajectory was planned with the use of a 22 gauge spinal needle. An 18 gauge trocar needle was advanced into the abscess/fluid collection and a short Amplatz super stiff wire was coiled within the collection. Appropriate positioning was confirmed with a limited CT scan. The tract was serially dilated allowing placement of a 10 [place] all-purpose drainage catheter. Appropriate positioning was confirmed with a limited postprocedural CT scan.  Ten ml of serous fluid was aspirated. The tube was connected to a JP suction bulb and secured in place. A dressing  was placed. The patient tolerated the procedure well without immediate post procedural complication.  IMPRESSION: Successful CT guided placement of a 10 French all purpose drain catheter into the subhepatic fluid collection with aspiration of 10 mL of serous fluid.   Electronically Signed   By: Marijo Conception, M.D.   On: 07/23/2014 12:08   US Abdomen Limited Ruq  07/12/2014   CLINICAL DATA:  Right upper quadrant abdominal pain. Elevated bilirubin level.  EXAM: US ABDOMEN LIMITED - RIGHT UPPER QUADRANT  COMPARISON:  Ultrasound of March 27, 2014.  FINDINGS: Gallbladder:  Gallbladder appears to be mildly distended with mild amount of sludge present. No gallstones are noted. No gallbladder wall thickening or pericholecystic fluid is noted. Positive sonographic Percell Miller sign is noted.  Common bile duct:  Diameter: Common bile duct measures 9.1 mm concerning for distal common bile duct obstruction, particularly the stated history of elevated bilirubin level.  Liver:  Increased echogenicity of hepatic parenchyma is noted suggesting fatty infiltration. Stable bilobed 1.9 cm left hepatic cyst is noted.  IMPRESSION: Stable left hepatic cyst.  Increased echogenicity of hepatic parenchyma consistent with fatty infiltration.  Common bile duct is dilated concerning for distal common bile duct obstruction given the history of elevated bilirubin level. MRCP is recommended for further evaluation.  Mild distention of gallbladder is noted with sludge present. Positive sonographic Murphy's sign is noted. HIDA scan is recommended to evaluate for possible cholecystitis.   Electronically Signed   By: Marijo Conception, M.D.   On: 07/12/2014 20:46    Assessment:   Joyia Riehle is a 49 y.o. female with abd abscess following complicated cholecystectomy.  Culture with Klebsiella and Group B strep.  Remains febrile and having current issues with hypotension possibly related to pain meds and responding some to IVF.  WBC when checked yest was  nml.   Recommendations Would continue IV abx at this point with unasyn pending improvement in BP and clinical situation. [place] are pending with recent fevers Check CBC in AM If worsens would repeat CT abd but drain seems well positioned and has continued drainage Thank you very much for allowing me to participate in the care of this patient. Please call with questions.   Cheral Marker. Ola Spurr, MD

## 2014-08-06 NOTE — Clinical Documentation Improvement (Signed)
Supporting Information: Hypotension with low grade fever and chills/rigors, abdominal cavity infection worrisome for Septic shock, continue IV zosyn, per 8/02 progress notes.    Possible Clinical Condition: . Bacteremia (positive blood cultures only) . Sepsis with UTI, versus UTI only . Sepsis-specify causative organism if known (specify if POA) . Sepsis due to: --Device --Implant --Graft --Infusion . Severe sepsis-sepsis with organ dysfunction --Specify organ dysfunction Respiratory failure Encephalopathy Acute kidney failure Other (specify) . SIRS (Systemic Inflammatory Response Syndrome --With or without organ dysfunction . Document septic shock if present . Document any associated diagnoses/conditions    Thank Sherian Maroon Documentation Specialist [phone] [email or web address]

## 2014-08-06 NOTE — Progress Notes (Signed)
Patient to be transferred to CCU stepdown per DR. Volanda Napoleon order. Patient is lethargic with a temp of 101.9, BP 95/55, pulse 64, respirations 17, oxygen saturation 95% on 1 liter. Patient taken to the bathroom ambulates well voided 200 of yellow urine. CCU called to give report to receiving nurse spoke to Cleatrice Burke RN .

## 2014-08-06 NOTE — Consult Note (Signed)
PULMONARY / CRITICAL CARE MEDICINE   Name: Lori Mckenzie MRN: [id number] DOB: 06/21/1965    ADMISSION DATE:  08/03/2014 CONSULTATION DATE:  8/03  REQUESTING MD :  Volanda Napoleon, Hospitalist   PT PROFILE: 29 F previously admitted to [facility] 7/09 with abdominal/biliary pain. Underwent evaluations by GI medicine and surgery and ultimately underwent ERCP with stent placement 07/16/14. This procedure was complicated by small perforation and bile leak. Underwent CT guided drainage of gall bladder fossa 7/20 with drainage of serous fluid. Was eventually discharge 7/27. Readmitted 7/31 to hospitalist service with fever, increased RUQ pain and nausea/vomiting. CTAP 7/31 was consistent with abscess in GB fossa. Underwent repeat CT guided drainage of this area 8/01 with drainage of 300 cc purulent fluid described. Transferred to ICU/SDU 8/03 with fever, hypotension (resolved with 3 liters NS) and persistent severe abdominal pain. PCCM asked to assist in mgmt of severe sepsis/shock.  MAJOR EVENTS/TEST RESULTS: 7/31 CTAP: Enlarging gas and fluid collection within the gallbladder fossa now measuring up to 9.1 cm concerning for postoperative abscess 8/01 CT guided drainage of GB fossa abscess 8/03 repeat CTAP:  8/03 CT chest (ordered by surgery):   INDWELLING DEVICES: Perc drain to GB fossa 8/01 >>  L [place] CVL 8/03 >>   MICRO DATA: Abscess 8/01 >> Klebsiella oxytoca Blood 8/03 >>   ANTIMICROBIALS:  Pip-tazo 7/31 >> 8/02 Amox-clav 8/02 >> 8/03 Amp-sulbactam 8/03 >>      HISTORY OF PRESENT ILLNESS:   As above  PAST MEDICAL HISTORY :   has a past medical history of Brittle bone disease; Asthma; Thyroid disease; Hypertension; Back pain; Diabetes mellitus without complication; Chronic kidney disease; Anxiety; GERD (gastroesophageal reflux disease); Sleep apnea; COPD (chronic obstructive pulmonary disease); Hypothyroidism; Cervical disc disease; TIA (transient ischemic attack); Cancer; and Collagen vascular  disease.  has past surgical history that includes Abdominal hysterectomy; Tubal ligation; Colonoscopy with propofol (N/A, 06/23/2014); Esophagogastroduodenoscopy (N/A, 06/23/2014); Cholecystectomy (N/A, 07/15/2014); and ERCP (N/A, 07/16/2014). Prior to Admission medications   Medication Sig Start Date End Date Taking? Authorizing Provider  albuterol (PROVENTIL HFA;VENTOLIN HFA) 108 (90 BASE) MCG/ACT inhaler Inhale 2 puffs into the lungs every 4 (four) hours as needed for wheezing or shortness of breath.   Yes Historical Provider, MD  alprazolam Duanne Moron) 2 MG tablet Take 2 mg by mouth 3 (three) times daily as needed for anxiety.    Yes Historical Provider, MD  citalopram (CELEXA) 20 MG tablet Take 20 mg by mouth daily.   Yes Historical Provider, MD  clotrimazole (MYCELEX) 10 MG troche Take 10 mg by mouth 4 (four) times daily as needed (mouth dryness).   Yes Historical Provider, MD  fluticasone (FLONASE) 50 MCG/ACT nasal spray Place 1 spray into both nostrils daily as needed (dryness).    Yes Historical Provider, MD  Fluticasone-Salmeterol (ADVAIR) 100-50 MCG/DOSE AEPB Inhale 1 puff into the lungs 2 (two) times daily.   Yes Historical Provider, MD  furosemide (LASIX) 40 MG tablet Take 80 mg by mouth 2 (two) times daily.    Yes Historical Provider, MD  gabapentin (NEURONTIN) 100 MG capsule Take 300 mg by mouth 4 (four) times daily as needed (pain).   Yes Historical Provider, MD  hydrochlorothiazide (HYDRODIURIL) 25 MG tablet Take 12.5 mg by mouth daily as needed (id diastolic blood pressure >45).    Yes Historical Provider, MD  HYDROmorphone (DILAUDID) 2 MG tablet Take 0.5 tablets (1 mg total) by mouth every 6 (six) hours as needed for severe pain. 07/30/14  Yes Marlyce Huge, MD  ibuprofen (ADVIL,MOTRIN) 800 MG tablet Take 800 mg by mouth every 8 (eight) hours as needed for moderate pain.    Yes Historical Provider, MD  ipratropium (ATROVENT HFA) 17 MCG/ACT inhaler Inhale 2 puffs into the lungs every  8 (eight) hours.   Yes Historical Provider, MD  metFORMIN (GLUCOPHAGE) 1000 MG tablet Take 500-1,000 mg by mouth 2 (two) times daily. Dose range based on blood sugar   Yes Historical Provider, MD  methocarbamol (ROBAXIN) 500 MG tablet Take 500 mg by mouth 2 (two) times daily as needed for muscle spasms.  03/26/14  Yes Historical Provider, MD  omeprazole (PRILOSEC) 20 MG capsule Take 40 mg by mouth 2 (two) times daily as needed (acid reflux).   Yes Historical Provider, MD  OXYGEN Inhale 1.5-2 L into the lungs continuous.    Yes Historical Provider, MD  rizatriptan (MAXALT) 10 MG tablet Take 10 mg by mouth as needed for migraine. May repeat in 2 hours if needed   Yes Historical Provider, MD  ALPRAZolam Duanne Moron) 1 MG tablet Take 1 tablet (1 mg total) by mouth 3 (three) times daily as needed for anxiety. 08/06/14   Sherri Rad, MD  amoxicillin-clavulanate (AUGMENTIN) 875-125 MG per tablet Take 1 tablet by mouth 2 (two) times daily. 08/06/14   Sherri Rad, MD  oxyCODONE-acetaminophen (PERCOCET/ROXICET) 5-325 MG per tablet Take 2 tablets by mouth every 6 (six) hours as needed for severe pain. 08/06/14   Sherri Rad, MD   Allergies  Allergen Reactions  . Hydrocodone Itching    Other reaction(s): ITCHING  . Vicodin [Hydrocodone-Acetaminophen] Rash    FAMILY HISTORY:  indicated that her mother is deceased. She indicated that her father is deceased.  SOCIAL HISTORY:  reports that she has never smoked. She does not have any smokeless tobacco history on file. She reports that she does not drink alcohol or use illicit drugs. At baseline, disabled due to "COPD" and obesity  REVIEW OF SYSTEMS:  + fever, chills, sweats, abdominal pain, intermittent urinary retention Neg for chest pain, dyspnea, LE edema, calf tenderness  SUBJECTIVE:   VITAL SIGNS: Temp:  [98.4 F (36.9 C)-101.9 F (38.8 C)] 99.4 F (37.4 C) (08/03 1800) Pulse Rate:  [77-95] 91 (08/03 1800) Resp:  [17-28] 28 (08/03 1800) BP: (82-106)/(46-68)  106/68 mmHg (08/03 1800) SpO2:  [90 %-99 %] 91 % (08/03 1800) HEMODYNAMICS:   VENTILATOR SETTINGS:   INTAKE / OUTPUT:  Intake/Output Summary (Last 24 hours) at 08/06/14 2008 Last data filed at 08/06/14 1609  Gross per 24 hour  Intake   4885 ml  Output   1500 ml  Net   3385 ml    PHYSICAL EXAMINATION: General: Obese, pleasant, no overt distress Neuro:  Oriented, cognition intact, no focal deficits HEENT: NCAT, WNL Cardiovascular: Reg, no M Lungs: Clear to ausc and perc Abdomen:obese, diffusely mildly tender esp in upper quadrants without rebound or guarding Ext: warm, no edema   LABS:  CBC  Recent Labs Lab 08/04/14 0507 08/05/14 0952 08/06/14 1831  WBC 10.4 5.2 5.9  HGB 9.1* 10.0* 9.4*  HCT 27.1* 30.4* 28.3*  PLT 167 158 181   Coag's  Recent Labs Lab 08/04/14 0507  INR 1.37   BMET  Recent Labs Lab 08/03/14 1102 08/04/14 0507 08/06/14 1446  NA 137 137 138  K 3.5 3.4* 3.6  CL 94* 97* 104  CO2 32 31 27  BUN 10 7 7   CREATININE 0.65 0.65 0.55  GLUCOSE 166* 122* 88   Electrolytes  Recent Labs  Lab 08/03/14 1102 08/04/14 0507 08/05/14 1225 08/06/14 1446  CALCIUM 8.3* 7.8*  --  7.7*  MG  --   --  2.1  --    Sepsis Markers  Recent Labs Lab 08/06/14 1831  LATICACIDVEN 1.3   ABG No results for input(s): PHART, PCO2ART, PO2ART in the last 168 hours. Liver Enzymes  Recent Labs Lab 08/03/14 1102 08/04/14 0507  AST 26 20  ALT 13* 11*  ALKPHOS 59 54  BILITOT 0.9 0.9  ALBUMIN 2.9* 2.5*   Cardiac Enzymes No results for input(s): TROPONINI, PROBNP in the last 168 hours. Glucose  Recent Labs Lab 08/05/14 2006 08/05/14 2343 08/06/14 0429 08/06/14 0728 08/06/14 1205 08/06/14 1602  GLUCAP 105* 80 99 123* 112* 90    CXR: NACPD  ASSESSMENT / PLAN: INFECTIOUS A:  Severe sepsis due to GB fossa abscess P:   Monitor temp, WBC count Micro and abx as above F/U results ofCT scan Further decisions regarding any possible invasive  procedures per gen surg ID service following - all abx per Dr Samuella Bruin al  PULMONARY A: H/O COPD but never smoked No active wheezing P:   Supp O2 as needed to maintain SpO2 92-97% Cont scheduled inhalers/nebs as ordered  CARDIOVASCULAR A:  H/O Htn Hypotension without overt shock (no organ dysfunction, lactate WNL)  Responded to IVFs P:  Monitor hemodynamics Consider monitoring CVP if hypotension persists  MAP goal > 65 mmHg  RENAL A:   No issues P:   Monitor BMET intermittently Monitor I/Os Correct electrolytes as indicated IVFs adjusted 8/03  GASTROINTESTINAL A:   Obesity Recent complicated ERCP GB fossa abscess Chronic PPI use P:   SUP: oral PPI NPO for now Decisions re: timing and route of nutrition dependent on CT abd and gen surg input  HEMATOLOGIC A:   Mild anemia of acute critical illness without acute blood loss High risk of DVT - obesity/immobilization P:  DVT px: enoxaparin/SCDs Monitor CBC intermittently Transfuse per usual ICU guidelines  ENDOCRINE A:  DM 2 P:   CBGS/SSI q 4 hrs Holding oral agents including metformin  NEUROLOGIC A:   Anxiety disorder Abdominal pain P:   RASS goal: 0 Cont PRN alprazolam PRN fentanyl ordered  Discussed with Dr Burt Knack of general surgery  30 mins CCM time  Merton Border, MD ; [facility] service [place] [phone].   08/06/2014, 8:08 PM

## 2014-08-06 NOTE — Procedures (Signed)
PROCEDURE NOTE: CVL PLACEMENT  INDICATION:    Monitoring of central venous pressures and/or administration of medications optimally administered in central vein  CONSENT:   Risks of procedure as well as the alternatives were explained to the patient or surrogate. Consent for procedure obtained. A time out was performed.   PROCEDURE  Sterile technique was used including antiseptics, cap, gloves, gown, hand hygiene, mask and full body sheet.  Skin prep: Chlorhexidine; local anesthetic administered  A triple lumen catheter was placed in the L [place] vein using the Seldinger technique.   EVALUATION:  Blood flow good  Complications: No apparent complications  Patient tolerated the procedure well.  Chest X-ray ordered to verify placement and is pending   Merton Border, MD PCCM service Mobile [phone]

## 2014-08-06 NOTE — Consult Note (Signed)
  Psychiatry: Follow-up for this patient with chronic depression and anxiety. Patient briefly seen today and chart reviewed. Today she has developed a fever and more pain and appears to be getting sicker with dropping blood pressures. She is being transferred to the critical care unit. Briefly spoke with the patient and offered support. No indication to change any of her psychiatric medicine at this point. Will continue to follow as needed.

## 2014-08-06 NOTE — Care Management (Signed)
Home health nursing services requested by Dr. Marina Gravel.  Patient provided with list of [place].  Patient chose [facility].  Contacted with Malachy Mood from [place] with referral, and information faxed .  Confirmed with patient that home address and contact information was correct.  Nursing shared concern that patient may be in the process of being evicted. When I spoke with the patient she stated that the owner of the home had recently expired, but she has not heard they were going to be evicted.  Inpaient psychiatrist consult with patient completed.  Requested home health social worker, and psych nurse to see patient.

## 2014-08-06 NOTE — Progress Notes (Signed)
ANTICOAGULATION CONSULT NOTE - Initial Consult  Pharmacy Consult for Lovenox Indication: VTE prophylaxis  Allergies  Allergen Reactions  . Hydrocodone Itching    Other reaction(s): ITCHING  . Vicodin [Hydrocodone-Acetaminophen] Rash    Patient Measurements: Height: 5' 2"  (157.5 cm) Weight: 250 lb (113.399 kg) IBW/kg (Calculated) : 50.1 Heparin Dosing Weight:   Vital Signs: Temp: 99.4 F (37.4 C) (08/03 1800) Temp Source: Oral (08/03 1800) BP: 106/68 mmHg (08/03 1800) Pulse Rate: 91 (08/03 1800)  Labs:  Recent Labs  08/04/14 0507 08/05/14 0952 08/06/14 1446 08/06/14 1831  HGB 9.1* 10.0*  --  9.4*  HCT 27.1* 30.4*  --  28.3*  PLT 167 158  --  181  LABPROT 17.1*  --   --   --   INR 1.37  --   --   --   CREATININE 0.65  --  0.55  --     Estimated Creatinine Clearance: 99 mL/min (by C-G formula based on Cr of 0.55).   Medical History: Past Medical History  Diagnosis Date  . Brittle bone disease   . Asthma   . Thyroid disease   . Hypertension   . Back pain   . Diabetes mellitus without complication   . Chronic kidney disease   . Anxiety   . GERD (gastroesophageal reflux disease)   . Sleep apnea   . COPD (chronic obstructive pulmonary disease)   . Hypothyroidism   . Cervical disc disease   . TIA (transient ischemic attack)   . Cancer     Uteriine  ca 57yr ago partial hysterectomy  . Collagen vascular disease     RA  3-4 yrs ago    Medications:  Prescriptions prior to admission  Medication Sig Dispense Refill Last Dose  . albuterol (PROVENTIL HFA;VENTOLIN HFA) 108 (90 BASE) MCG/ACT inhaler Inhale 2 puffs into the lungs every 4 (four) hours as needed for wheezing or shortness of breath.   Past Week at Unknown time  . alprazolam (XANAX) 2 MG tablet Take 2 mg by mouth 3 (three) times daily as needed for anxiety.    08/03/2014 at Unknown time  . citalopram (CELEXA) 20 MG tablet Take 20 mg by mouth daily.   08/03/2014 at Unknown time  . clotrimazole  (MYCELEX) 10 MG troche Take 10 mg by mouth 4 (four) times daily as needed (mouth dryness).   08/03/2014 at Unknown time  . fluticasone (FLONASE) 50 MCG/ACT nasal spray Place 1 spray into both nostrils daily as needed (dryness).    08/03/2014 at Unknown time  . Fluticasone-Salmeterol (ADVAIR) 100-50 MCG/DOSE AEPB Inhale 1 puff into the lungs 2 (two) times daily.   Past Week at Unknown time  . furosemide (LASIX) 40 MG tablet Take 80 mg by mouth 2 (two) times daily.    08/03/2014 at Unknown time  . gabapentin (NEURONTIN) 100 MG capsule Take 300 mg by mouth 4 (four) times daily as needed (pain).   08/03/2014 at Unknown time  . hydrochlorothiazide (HYDRODIURIL) 25 MG tablet Take 12.5 mg by mouth daily as needed (id diastolic blood pressure >>83.    Past Week at Unknown time  . HYDROmorphone (DILAUDID) 2 MG tablet Take 0.5 tablets (1 mg total) by mouth every 6 (six) hours as needed for severe pain. 30 tablet 0 Past Week at Unknown time  . ibuprofen (ADVIL,MOTRIN) 800 MG tablet Take 800 mg by mouth every 8 (eight) hours as needed for moderate pain.    Past Month at Unknown time  . ipratropium (  ATROVENT HFA) 17 MCG/ACT inhaler Inhale 2 puffs into the lungs every 8 (eight) hours.   08/03/2014 at Unknown time  . metFORMIN (GLUCOPHAGE) 1000 MG tablet Take 500-1,000 mg by mouth 2 (two) times daily. Dose range based on blood sugar   08/03/2014 at Unknown time  . methocarbamol (ROBAXIN) 500 MG tablet Take 500 mg by mouth 2 (two) times daily as needed for muscle spasms.    Past Week at Unknown time  . omeprazole (PRILOSEC) 20 MG capsule Take 40 mg by mouth 2 (two) times daily as needed (acid reflux).   08/03/2014 at Unknown time  . OXYGEN Inhale 1.5-2 L into the lungs continuous.    08/03/2014 at Unknown time  . rizatriptan (MAXALT) 10 MG tablet Take 10 mg by mouth as needed for migraine. May repeat in 2 hours if needed   Past Month at Unknown time    Assessment: CrCl = 99 ml/min BMI = 45  Goal of Therapy:  DVT  prophylaxis   Plan:  Lovenox 40 mg SQ Q24H originally ordered.  Will adjust dose to Lovenox 40 mg SQ Q12H based on BMI > 40.   Kaycie Pegues D 08/06/2014,8:27 PM

## 2014-08-06 NOTE — Progress Notes (Signed)
[place] [facility] Internal medicine consult follow-up note   PATIENT NAME: Lori Mckenzie    MR#:  [id number]  DATE OF BIRTH:  28-Nov-1965  SUBJECTIVE:  CHIEF COMPLAINT:   Chief Complaint  Patient presents with  . Fever  . Nausea  . Abdominal Pain   Lethargic, complains of generalized abdominal pain  REVIEW OF SYSTEMS:   Review of Systems  Constitutional: Negative for fever.  Respiratory: Negative for shortness of breath.   Cardiovascular: Negative for chest pain and palpitations.  Gastrointestinal: Positive for abdominal pain. Negative for nausea and vomiting.  Genitourinary: Negative for dysuria.    DRUG ALLERGIES:   Allergies  Allergen Reactions  . Hydrocodone Itching    Other reaction(s): ITCHING  . Vicodin [Hydrocodone-Acetaminophen] Rash    VITALS:  Blood pressure 93/59, pulse 79, temperature 98.4 F (36.9 C), temperature source Oral, resp. rate 17, height 5' 2"  (1.575 m), weight 113.399 kg (250 lb), SpO2 94 %.  PHYSICAL EXAMINATION:  GENERAL:  49 y.o.-year-old patient lying in the bed, fatigued, pale  EYES: Pupils equal, round, enlarged, reactive to light and accommodation. No scleral icterus. Extraocular muscles intact.  HEENT: Head atraumatic, normocephalic. Oropharynx and nasopharynx clear. His membranes are moist NECK:  Supple, no jugular venous distention. No thyroid enlargement, no tenderness.  LUNGS: Normal breath sounds bilaterally, short shallow respirations, no respiratory distress  CARDIOVASCULAR: S1, S2 normal. No murmurs, rubs, or gallops.  ABDOMEN: Soft, minimal tenderness to palpation throughout, nondistended. Bowel sounds present. No organomegaly or mass. Right upper quadrant drain in place with serosanguineous drainage into the drain, no leakage EXTREMITIES: No pedal edema, cyanosis, or clubbing.  NEUROLOGIC: Cranial nerves II through XII are intact. Muscle strength 5/5 in all extremities. Sensation intact.  Gait not checked.  PSYCHIATRIC: The patient is alert and oriented x 3.  SKIN: No obvious rash, lesion, or ulcer.    LABORATORY PANEL:   CBC  Recent Labs Lab 08/05/14 0952  WBC 5.2  HGB 10.0*  HCT 30.4*  PLT 158   ------------------------------------------------------------------------------------------------------------------  Chemistries   Recent Labs Lab 08/04/14 0507 08/05/14 1225  NA 137  --   K 3.4*  --   CL 97*  --   CO2 31  --   GLUCOSE 122*  --   BUN 7  --   CREATININE 0.65  --   CALCIUM 7.8*  --   MG  --  2.1  AST 20  --   ALT 11*  --   ALKPHOS 54  --   BILITOT 0.9  --    ------------------------------------------------------------------------------------------------------------------  Cardiac Enzymes No results for input(s): TROPONINI in the last 168 hours. ------------------------------------------------------------------------------------------------------------------  RADIOLOGY:  No results found.  EKG:   Orders placed or performed during the hospital encounter of 07/16/14  . EKG 12-Lead  . EKG 12-Lead  . EKG 12-Lead  . EKG 12-Lead  . EKG    ASSESSMENT AND PLAN:   #1 hypotension - Concern for sepsis, check blood cultures, start broad-spectrum IV and biotics - Seems to be responsive to fluids, monitor closely, discussed central line placement with possible transfer to ICU if pressors are needed later today - Hold narcotics and antihypertensives  #2 intra-abdominal infection with abscess now s/p drain on 8/1 - febrile overnight - abscess aspirate growing Klebsiella and group B strep - Currently on Augmentin, I will discontinue this and start IV Zosyn - ID consultation  #3 acute postoperative anemia due to blood loss - Hemoglobin has gone  from 11.5-9.1, improved today to 10.0 continue to monitor.  - Hypotension possibly due to bleeding  #4 type 2 diabetes mellitus -  sliding scale, CBGs well-controlled  #5  COPD (chronic  obstructive pulmonary disease) - continue home inhalers.  #6  OSA on CPAP - CPAP daily at bedtime while here  #7  GERD (gastroesophageal reflux disease) - equivalent home dose PPI  #8 Anxiety  - With multiple current stressors including her husband also being admitted at [facility] - continue home medications - Appreciate psychiatry consultation  Disposition: Patient is not ready for discharge. With her current hypotension I'm concerned for sepsis. If blood pressures do not improve today will transfer to ICU for pressor support.  CODE STATUS: Full  TOTAL TIME TAKING CARE OF THIS PATIENT: 45 minutes.   POSSIBLE D/C IN 3-4 DAYS, DEPENDING ON CLINICAL CONDITION.  Myrtis Ser M.D on 08/06/2014 at 1:35 PM  Between 7am to 6pm - Pager - [phone]  After 6pm go to [email or web address] - password [email or web address] [facility]  Office  [phone]  CC: Primary care physician; Ashkin, Neldon Labella, MD

## 2014-08-06 NOTE — Progress Notes (Signed)
Patient ID: Lori Mckenzie, female   DOB: 06-07-1965, 49 y.o.   MRN: [id number] [facility]   PATIENT NAME: Lori Mckenzie    MR#:  [id number]  DATE OF BIRTH:  [birth year]  SUBJECTIVE:   Feels better, less pain, low BP thought secondary to morphine po.  RN reported she was lethargic with MS contin and MSIR.   REVIEW OF SYSTEMS:   Review of Systems  Gastrointestinal: Positive for abdominal pain.  All other systems reviewed and are negative.   DRUG ALLERGIES:   Allergies  Allergen Reactions  . Hydrocodone Itching    Other reaction(s): ITCHING  . Vicodin [Hydrocodone-Acetaminophen] Rash    VITALS:  Blood pressure 82/46, pulse 77, temperature 98.4 F (36.9 C), temperature source Oral, resp. rate 17, height 5' 2"  (1.575 m), weight 250 lb (113.399 kg), SpO2 94 %.  PHYSICAL EXAMINATION:  GENERAL:  49 y.o.-year-old patient lying in the bed with no acute distress.  CARDIOVASCULAR: S1, S2 normal. No murmurs, rubs, or gallops.  ABDOMEN: Soft, nontender, nondistended. Bowel sounds present. No organomegaly or mass. No bile in JP.   CBC Latest Ref Rng 08/05/2014 08/04/2014 08/03/2014  WBC 3.6 - 11.0 K/uL 5.2 10.4 9.7  Hemoglobin 12.0 - 16.0 g/dL 10.0(L) 9.1(L) 10.6(L)  Hematocrit 35.0 - 47.0 % 30.4(L) 27.1(L) 32.3(L)  Platelets 150 - 440 K/uL 158 167 192    I/O last 3 completed shifts: In: 4355.5 [P.O.:1680; I.V.:2521.5; IV Piggyback:154] Out: 850 [Urine:600; Drains:250] Total I/O In: 0  Out: 100 [Urine:100]     ASSESSMENT AND PLAN:   Febrile last evening,  Pain and psych and social issues dominate stay in hospital.  Appreciate Dr Weber Cooks visit.  Will stop ms contin and MSIR and move to percocet.  Will plan dc with JP once afebrile for 24 hrs.

## 2014-08-06 NOTE — Progress Notes (Signed)
History reviewed and discussed with Dr. Marina Gravel. I also discussed this with the ICU physician.  Physical exam demonstrates no change in her exam from my prior exams of a week ago she is minimally tender without peritoneal signs. Calves are nontender. No jaundice no icterus minimal drainage from the right upper quadrant drain.  With the patient's recent febrile episode my suggestion would be to obtain a CT scan of her chest abdomen and pelvis as discussed with the intensivist who is in agreement with this plan and I will place those orders patient understood and agreed with plan as well

## 2014-08-07 ENCOUNTER — Inpatient Hospital Stay: Payer: Medicaid Other | Admitting: Surgery

## 2014-08-07 LAB — COMPREHENSIVE METABOLIC PANEL
ALT: 11 U/L — ABNORMAL LOW (ref 14–54)
AST: 22 U/L (ref 15–41)
Albumin: 2.1 g/dL — ABNORMAL LOW (ref 3.5–5.0)
Alkaline Phosphatase: 42 U/L (ref 38–126)
Anion gap: 5 (ref 5–15)
BUN: 5 mg/dL — ABNORMAL LOW (ref 6–20)
CO2: 27 mmol/L (ref 22–32)
Calcium: 7.8 mg/dL — ABNORMAL LOW (ref 8.9–10.3)
Chloride: 108 mmol/L (ref 101–111)
Creatinine, Ser: 0.49 mg/dL (ref 0.44–1.00)
GFR calc Af Amer: >60 mL/min (ref >60)
GFR calc non Af Amer: >60 mL/min (ref >60)
Glucose, Bld: 109 mg/dL — ABNORMAL HIGH (ref 65–99)
Potassium: 3.8 mmol/L (ref 3.5–5.1)
Sodium: 140 mmol/L (ref 135–145)
Total Bilirubin: 0.4 mg/dL (ref 0.3–1.2)
Total Protein: 6.2 g/dL — ABNORMAL LOW (ref 6.5–8.1)

## 2014-08-07 LAB — URINALYSIS COMPLETE WITH MICROSCOPIC (ARMC ONLY)
Bacteria, UA: NONE SEEN
Bilirubin Urine: NEGATIVE
Glucose, UA: NEGATIVE mg/dL
Hgb urine dipstick: NEGATIVE
Ketones, ur: NEGATIVE mg/dL
Leukocytes, UA: NEGATIVE
Nitrite: NEGATIVE
Protein, ur: NEGATIVE mg/dL
Specific Gravity, Urine: 1.006 (ref 1.005–1.030)
pH: 7 (ref 5.0–8.0)

## 2014-08-07 LAB — CBC
HCT: 27.9 % — ABNORMAL LOW (ref 35.0–47.0)
Hemoglobin: 9.4 g/dL — ABNORMAL LOW (ref 12.0–16.0)
MCH: 31.5 pg (ref 26.0–34.0)
MCHC: 33.6 g/dL (ref 32.0–36.0)
MCV: 93.7 fL (ref 80.0–100.0)
Platelets: 178 10*3/uL (ref 150–440)
RBC: 2.98 MIL/uL — ABNORMAL LOW (ref 3.80–5.20)
RDW: 14.4 % (ref 11.5–14.5)
WBC: 6.4 10*3/uL (ref 3.6–11.0)

## 2014-08-07 LAB — GLUCOSE, CAPILLARY
Glucose-Capillary: 101 mg/dL — ABNORMAL HIGH (ref 65–99)
Glucose-Capillary: 105 mg/dL — ABNORMAL HIGH (ref 65–99)
Glucose-Capillary: 108 mg/dL — ABNORMAL HIGH (ref 65–99)
Glucose-Capillary: 109 mg/dL — ABNORMAL HIGH (ref 65–99)
Glucose-Capillary: 151 mg/dL — ABNORMAL HIGH (ref 65–99)
Glucose-Capillary: 91 mg/dL (ref 65–99)

## 2014-08-07 LAB — CULTURE, ROUTINE-ABSCESS: Special Requests: NORMAL

## 2014-08-07 LAB — C DIFFICILE QUICK SCREEN W PCR REFLEX
C Diff antigen: NEGATIVE
C Diff interpretation: NEGATIVE
C Diff toxin: NEGATIVE

## 2014-08-07 LAB — PROCALCITONIN: Procalcitonin: 1.41 ng/mL

## 2014-08-07 MED ORDER — OXYCODONE-ACETAMINOPHEN 5-325 MG PO TABS
1.0000 | ORAL_TABLET | Freq: Four times a day (QID) | ORAL | Status: DC | PRN
  Administered 2014-08-07 – 2014-08-08 (×3): 1 via ORAL
  Filled 2014-08-07 (×3): qty 1

## 2014-08-07 MED ORDER — CETYLPYRIDINIUM CHLORIDE 0.05 % MT LIQD
7.0000 mL | Freq: Two times a day (BID) | OROMUCOSAL | Status: DC
  Administered 2014-08-07 (×2): 7 mL via OROMUCOSAL

## 2014-08-07 MED ORDER — IPRATROPIUM BROMIDE 0.02 % IN SOLN: 0.5000 mg | Freq: Four times a day (QID) | RESPIRATORY_TRACT | Status: DC | PRN

## 2014-08-07 MED ORDER — ONDANSETRON HCL 4 MG/2ML IJ SOLN
4.0000 mg | Freq: Four times a day (QID) | INTRAMUSCULAR | Status: DC | PRN
  Administered 2014-08-08 (×2): 4 mg via INTRAVENOUS
  Filled 2014-08-07 (×2): qty 2

## 2014-08-07 NOTE — Progress Notes (Signed)
RN notified Dr. Marina Gravel that this nurse discussed with patient that Dr. Marina Gravel was notified of her complaint of pain and that he would not be ordering any more pain medicine. RN made Dr. Marina Gravel aware that patient is upset stating "im going to leave the hospital and go to [facility] if they are going to treat me like this." Dr. Marina Gravel stated "the reason she had to come to the ICU is because of pain medicine dropping her blood pressure, so I will not be ordering her any more pain meds than what is ordered, you can call Dr. Volanda Napoleon and ask her what she wants to do but I will not order anymore." RN paged Dr. Volanda Napoleon.

## 2014-08-07 NOTE — Progress Notes (Addendum)
[place] INFECTIOUS DISEASE PROGRESS NOTE Date of Admission:  08/03/2014     ID: Lori Mckenzie is a 49 y.o. female with abd infection Active Problems:   Anxiety   Abscess of abdominal cavity   Dysthymia   Other social stressor   Subjective: Transferred to ICU and had repeat CT done. Still with abd pain - diffuse. No fevers. BP improved but still borderline with IV fluids.  ROS  Eleven systems are reviewed and negative except per hpi  Medications:  Antibiotics Given (last 72 hours)    Date/Time Action Medication Dose Rate   08/04/14 1528 Given   piperacillin-tazobactam (ZOSYN) IVPB 3.375 g 3.375 g 12.5 mL/hr   08/04/14 2230 Given   piperacillin-tazobactam (ZOSYN) IVPB 3.375 g 3.375 g 12.5 mL/hr   08/05/14 0503 Given   piperacillin-tazobactam (ZOSYN) IVPB 3.375 g 3.375 g 12.5 mL/hr   08/05/14 1055 Given   amoxicillin-clavulanate (AUGMENTIN) 875-125 MG per tablet 1 tablet 1 tablet    08/05/14 2128 Given   amoxicillin-clavulanate (AUGMENTIN) 875-125 MG per tablet 1 tablet 1 tablet    08/06/14 0953 Given   amoxicillin-clavulanate (AUGMENTIN) 875-125 MG per tablet 1 tablet 1 tablet    08/06/14 1551 Given  [med not available at earlier time]   Ampicillin-Sulbactam (UNASYN) 3 g in sodium chloride 0.9 % 100 mL IVPB 3 g 100 mL/hr   08/06/14 2152 Given   Ampicillin-Sulbactam (UNASYN) 3 g in sodium chloride 0.9 % 100 mL IVPB 3 g 100 mL/hr   08/07/14 0158 Given   Ampicillin-Sulbactam (UNASYN) 3 g in sodium chloride 0.9 % 100 mL IVPB 3 g 100 mL/hr   08/07/14 0912 Given   Ampicillin-Sulbactam (UNASYN) 3 g in sodium chloride 0.9 % 100 mL IVPB 3 g 100 mL/hr     . ampicillin-sulbactam (UNASYN) IV  3 g Intravenous Q6H  . antiseptic oral rinse  7 mL Mouth Rinse BID  . citalopram  20 mg Oral Daily  . enoxaparin (LOVENOX) injection  40 mg Subcutaneous Q12H  . insulin aspart  0-9 Units Subcutaneous 6 times per day  . mometasone-formoterol  2 puff Inhalation BID  . pantoprazole  40 mg  Oral QHS    Objective: Vital signs in last 24 hours: Temp:  [98.3 F (36.8 C)-101.9 F (38.8 C)] 98.3 F (36.8 C) (08/04 0900) Pulse Rate:  [79-99] 80 (08/04 1000) Resp:  [16-35] 25 (08/04 1000) BP: (86-114)/(51-78) 100/69 mmHg (08/04 1000) SpO2:  [90 %-100 %] 95 % (08/04 1000)  Morbidly obese HENT: [place]/AT, PERRLA, no scleral icterus Mouth/Throat: Oropharynx is clear and moist. No oropharyngeal exudate.  Cardiovascular: Normal rate, regular rhythm and normal heart sounds. Pulmonary/Chest: Effort normal and breath sounds normal. No respiratory distress. has no wheezes.  Neck supple, no nuchal rigidity Abdominal: distended, has drain RUQ with some ss fluid. abd mod ttp RUQ Lymphadenopathy: no cervical adenopathy. No axillary adenopathy Neurological: alert and oriented to person, place, and time.  Skin: Skin is warm and dry. No rash noted. No erythema.  Psychiatric: a normal mood and affect. behavior is normal.   Lab Results  Recent Labs  08/06/14 1446 08/06/14 1831 08/07/14 0622  WBC  --  5.9 6.4  HGB  --  9.4* 9.4*  HCT  --  28.3* 27.9*  NA 138  --  140  K 3.6  --  3.8  CL 104  --  108  CO2 27  --  27  BUN 7  --  5*  CREATININE 0.55  --  0.49  Microbiology: Results for orders placed or performed during the hospital encounter of 08/03/14  Culture, routine-abscess     Status: None   Collection Time: 08/04/14 12:13 PM  Result Value Ref Range Status   Specimen Description ABDOMEN  Final   Special Requests Normal  Final   Gram Stain   Final    MODERATE WBC SEEN MANY GRAM POSITIVE COCCI MODERATE GRAM NEGATIVE RODS    Culture   Final    MODERATE GROWTH KLEBSIELLA OXYTOCA LIGHT GROWTH GROUP B STREP(S.AGALACTIAE)ISOLATED Virtually 100% of S. agalactiae (Group B) strains are susceptible to Penicillin.  For Penicillin-allergic patients, Erythromycin (85-95% sensitive) and Clindamycin (80% sensitive) are drugs of choice. Contact microbiology lab to request  sensitivities if  needed within 7 days. HEAVY GROWTH VIRIDANS STREPTOCOCCUS CALL MICROBIOLOGY LAB IF SENSITIVITIES ARE REQUIRED.    Report Status 08/07/2014 FINAL  Final   Organism ID, Bacteria KLEBSIELLA OXYTOCA  Final      Susceptibility   Klebsiella oxytoca - MIC*    AMPICILLIN >=32 RESISTANT Resistant     CEFAZOLIN <=4 SENSITIVE Sensitive     CEFTRIAXONE <=1 SENSITIVE Sensitive     CIPROFLOXACIN <=0.25 SENSITIVE Sensitive     GENTAMICIN <=1 SENSITIVE Sensitive     IMIPENEM <=0.25 SENSITIVE Sensitive     NITROFURANTOIN <=16 SENSITIVE Sensitive     TRIMETH/SULFA <=20 SENSITIVE Sensitive     PIP/TAZO Value in next row Sensitive      SENSITIVE<=4    AMPICILLIN/SULBACTAM Value in next row Sensitive      SENSITIVE8    * MODERATE GROWTH KLEBSIELLA OXYTOCA  Anaerobic culture     Status: None (Preliminary result)   Collection Time: 08/04/14 12:13 PM  Result Value Ref Range Status   Specimen Description ABSCESS  Final   Special Requests NONE  Final   Culture HOLDING FOR POSSIBLE ANAEROBE  Final   Report Status PENDING  Incomplete  Culture, blood (routine x 2)     Status: None (Preliminary result)   Collection Time: 08/06/14 12:06 PM  Result Value Ref Range Status   Specimen Description BLOOD LEFT HAND  Final   Special Requests   Final    BOTTLES DRAWN AEROBIC AND ANAEROBIC  2CC AEROBIC,5 CC ANAEROBIC   Culture NO GROWTH < 24 HOURS  Final   Report Status PENDING  Incomplete  Culture, blood (routine x 2)     Status: None (Preliminary result)   Collection Time: 08/06/14 12:06 PM  Result Value Ref Range Status   Specimen Description BLOOD LEFT ASSIST CONTROL  Final   Special Requests   Final    BOTTLES DRAWN AEROBIC AND ANAEROBIC  10 CC ANAEROBIC, 6 CC AEROBIC   Culture NO GROWTH < 24 HOURS  Final   Report Status PENDING  Incomplete  MRSA PCR Screening     Status: None   Collection Time: 08/06/14  6:08 PM  Result Value Ref Range Status   MRSA by PCR NEGATIVE NEGATIVE Final     Comment:        The GeneXpert MRSA Assay (FDA approved for NASAL specimens only), is one component of a comprehensive MRSA colonization surveillance program. It is not intended to diagnose MRSA infection nor to guide or monitor treatment for MRSA infections.      Studies/Results: Ct Chest W Contrast  08/07/2014   CLINICAL DATA:  Acute onset of worsening right upper quadrant abdominal pain, nausea and vomiting. Fever. Abscess at the gallbladder fossa, status post CT-guided drainage. Fever and hypotension, with severe  abdominal pain. Initial encounter.  EXAM: CT CHEST, ABDOMEN, AND PELVIS WITH CONTRAST  TECHNIQUE: Multidetector CT imaging of the chest, abdomen and pelvis was performed following the standard protocol during bolus administration of intravenous contrast.  CONTRAST:  153m OMNIPAQUE IOHEXOL 350 MG/ML SOLN  COMPARISON:  CT of the abdomen and pelvis performed 08/03/2014  FINDINGS: CT CHEST FINDINGS  Mild bilateral dependent subsegmental atelectasis is noted. The lungs are otherwise grossly clear. No pleural effusion or pneumothorax is seen. No masses are identified.  A left subclavian line is noted ending about the distal SVC. The mediastinum is unremarkable in appearance. No mediastinal lymphadenopathy is seen. No pericardial effusion is identified. The great vessels are grossly unremarkable in appearance. The visualized portions of the thyroid gland are unremarkable. No axillary lymphadenopathy is seen.  No acute osseous abnormalities are identified.  CT ABDOMEN AND PELVIS FINDINGS  There is new small to moderate volume ascites noted tracking about the abdomen and pelvis. Some of this demonstrates increased attenuation. Given recent placement of a drainage catheter at the right upper quadrant and return of purulent fluid from a large abscess, this is concerning for leakage of purulent fluid across the abdomen, and raises question for peritonitis. This would correspond to the clinically  described severe sepsis and shock. Would correlate for associated abdominal symptoms.  A 2.1 cm hypodensity within the lateral aspect of the left hepatic lobe is stable from the prior study and likely reflects a cyst. The spleen is enlarged, measuring 21.7 cm in length. Residual debris and soft tissue inflammation are noted at the gallbladder fossa. The patient is status post cholecystectomy. No definite residual drainable abscess is seen. The patient's right upper quadrant pigtail catheter ends adjacent to the gallbladder fossa. The pancreas and adrenal glands are unremarkable. A common bile duct stent is noted extending into the third segment of the duodenum. This is grossly unchanged from the prior study.  The kidneys are unremarkable in appearance. There is no evidence of hydronephrosis. No renal or ureteral stones are seen. No perinephric stranding is appreciated.  No free fluid is identified. The small bowel is unremarkable in appearance. The stomach is within normal limits. No acute vascular abnormalities are seen.  The appendix is normal in caliber and contains residual contrast. Minimal diverticulosis is noted along the proximal sigmoid colon. The colon is otherwise unremarkable.  The bladder is mildly distended and grossly unremarkable. The patient is status post hysterectomy. The ovaries are grossly symmetric. No suspicious adnexal masses are seen. No inguinal lymphadenopathy is seen.  No acute osseous abnormalities are identified. Mild facet disease is noted at the lower lumbar spine.  IMPRESSION: 1. New small to moderate volume ascites tracking about the abdomen and pelvis. Some of this demonstrates increased attenuation. Given recent placement of the drainage catheter at the right upper quadrant and return of purulent fluid from a large abscess, this is concerning for leakage of purulent fluid across the abdomen, and raises question for peritonitis. This would correspond to clinically described severe  sepsis and shock. Would correlate for associated abdominal symptoms. 2. Residual debris and soft tissue inflammation at the gallbladder fossa. No definite residual drainable abscess seen at this time. Right upper quadrant pigtail catheter ends adjacent to the gallbladder fossa. Common bile duct stent is grossly unchanged from the prior study. 3. Marked splenomegaly.  Hepatic cyst again noted. 4. Minimal bilateral dependent subsegmental atelectasis. Lungs otherwise clear. 5. Minimal diverticulosis along the proximal sigmoid colon.  These results were called by telephone  at the time of interpretation on 08/07/2014 at 2:14 am to Nursing in the [facility] ICU, who verbally acknowledged these results.   Electronically Signed   By: Garald Balding M.D.   On: 08/07/2014 02:33   Ct Abdomen Pelvis W Contrast  08/07/2014   CLINICAL DATA:  Acute onset of worsening right upper quadrant abdominal pain, nausea and vomiting. Fever. Abscess at the gallbladder fossa, status post CT-guided drainage. Fever and hypotension, with severe abdominal pain. Initial encounter.  EXAM: CT CHEST, ABDOMEN, AND PELVIS WITH CONTRAST  TECHNIQUE: Multidetector CT imaging of the chest, abdomen and pelvis was performed following the standard protocol during bolus administration of intravenous contrast.  CONTRAST:  176m OMNIPAQUE IOHEXOL 350 MG/ML SOLN  COMPARISON:  CT of the abdomen and pelvis performed 08/03/2014  FINDINGS: CT CHEST FINDINGS  Mild bilateral dependent subsegmental atelectasis is noted. The lungs are otherwise grossly clear. No pleural effusion or pneumothorax is seen. No masses are identified.  A left subclavian line is noted ending about the distal SVC. The mediastinum is unremarkable in appearance. No mediastinal lymphadenopathy is seen. No pericardial effusion is identified. The great vessels are grossly unremarkable in appearance. The visualized portions of the thyroid gland are unremarkable. No axillary lymphadenopathy is  seen.  No acute osseous abnormalities are identified.  CT ABDOMEN AND PELVIS FINDINGS  There is new small to moderate volume ascites noted tracking about the abdomen and pelvis. Some of this demonstrates increased attenuation. Given recent placement of a drainage catheter at the right upper quadrant and return of purulent fluid from a large abscess, this is concerning for leakage of purulent fluid across the abdomen, and raises question for peritonitis. This would correspond to the clinically described severe sepsis and shock. Would correlate for associated abdominal symptoms.  A 2.1 cm hypodensity within the lateral aspect of the left hepatic lobe is stable from the prior study and likely reflects a cyst. The spleen is enlarged, measuring 21.7 cm in length. Residual debris and soft tissue inflammation are noted at the gallbladder fossa. The patient is status post cholecystectomy. No definite residual drainable abscess is seen. The patient's right upper quadrant pigtail catheter ends adjacent to the gallbladder fossa. The pancreas and adrenal glands are unremarkable. A common bile duct stent is noted extending into the third segment of the duodenum. This is grossly unchanged from the prior study.  The kidneys are unremarkable in appearance. There is no evidence of hydronephrosis. No renal or ureteral stones are seen. No perinephric stranding is appreciated.  No free fluid is identified. The small bowel is unremarkable in appearance. The stomach is within normal limits. No acute vascular abnormalities are seen.  The appendix is normal in caliber and contains residual contrast. Minimal diverticulosis is noted along the proximal sigmoid colon. The colon is otherwise unremarkable.  The bladder is mildly distended and grossly unremarkable. The patient is status post hysterectomy. The ovaries are grossly symmetric. No suspicious adnexal masses are seen. No inguinal lymphadenopathy is seen.  No acute osseous abnormalities  are identified. Mild facet disease is noted at the lower lumbar spine.  IMPRESSION: 1. New small to moderate volume ascites tracking about the abdomen and pelvis. Some of this demonstrates increased attenuation. Given recent placement of the drainage catheter at the right upper quadrant and return of purulent fluid from a large abscess, this is concerning for leakage of purulent fluid across the abdomen, and raises question for peritonitis. This would correspond to clinically described severe sepsis and shock. Would  correlate for associated abdominal symptoms. 2. Residual debris and soft tissue inflammation at the gallbladder fossa. No definite residual drainable abscess seen at this time. Right upper quadrant pigtail catheter ends adjacent to the gallbladder fossa. Common bile duct stent is grossly unchanged from the prior study. 3. Marked splenomegaly.  Hepatic cyst again noted. 4. Minimal bilateral dependent subsegmental atelectasis. Lungs otherwise clear. 5. Minimal diverticulosis along the proximal sigmoid colon.  These results were called by telephone at the time of interpretation on 08/07/2014 at 2:14 am to Nursing in the [facility] ICU, who verbally acknowledged these results.   Electronically Signed   By: Garald Balding M.D.   On: 08/07/2014 02:33   Dg Chest Port 1 View  08/06/2014   CLINICAL DATA:  Central line placement.  Initial encounter.  EXAM: PORTABLE CHEST - 1 VIEW  COMPARISON:  Chest radiograph performed 07/18/2014  FINDINGS: The patient's left subclavian line is noted ending about the mid SVC.  The lungs are hypoexpanded. Vascular congestion is noted. Minimal bilateral atelectasis is seen. No pleural effusion or pneumothorax is identified.  The cardiomediastinal silhouette is borderline enlarged. No acute osseous abnormalities are identified.  IMPRESSION: 1. Left subclavian line noted ending about the mid SVC. 2. Lungs hypoexpanded. Vascular congestion and borderline cardiomegaly noted.  Minimal bilateral atelectasis seen.   Electronically Signed   By: Garald Balding M.D.   On: 08/06/2014 20:14    Assessment/Plan: Lori Mckenzie is a 49 y.o. female with abd abscess following complicated cholecystectomy. Culture from abscess with Klebsiella and Group B strep. She had recurrent febrile and having current issues with hypotension possibly related to pain meds and responding some to IVF.    Recommendations Would continue IV abx at this point with unasyn pending improvement in BP and clinical situation. BCX are negative to day  Reviewed Dr Antionette Char note. Defer drain management to Dr Felton Clinton Thank you very much for allowing me to participate in the care of this patient. Please call with questions.  [place], [place]   08/07/2014, 11:31 AM

## 2014-08-07 NOTE — Progress Notes (Signed)
Pt CO abdominal pain, asked me to call the MD. I notified prime doc on call who said he was not going to contradict orders the day team placed today. Will notify pt that this will have to be reevaluated with the day team tomorrow.

## 2014-08-07 NOTE — Progress Notes (Signed)
CT scan personally reviewed and discussed with RN. Findings of minimal fluid along the edge of the liver undrained by the pigtail catheter is noted there also seems to be a small amount of fluid in the pelvis.  Patient's vital signs remained stable this evening. We'll discuss and review CT scan with Dr. Marina Gravel to see if additional drainage is necessary for the smaller pelvic fluid collection or repositioning of the right upper quadrant pigtail catheter

## 2014-08-07 NOTE — Progress Notes (Addendum)
RN notified Dr. Volanda Napoleon that patient is complaining of pain to abdomen and that percocet was given at 1617 and that patient is upset that fentanyl was discontinued and has asked for more pain medicine and that Dr. Marina Gravel has been notified of patient's complaint and request and Dr. Marina Gravel will not order any more pain medicine stating that pain medication is the reason the patient ended up in the ICU with low blood pressure. Dr. Volanda Napoleon stated "I agree and I do not want to order anymore pain medicine at this time for the same concerns."  Teandre Hamre B

## 2014-08-07 NOTE — Progress Notes (Addendum)
[place] at [facility] Internal medicine consult follow-up note   PATIENT NAME: Lori Mckenzie    MR#:  [id number]  DATE OF BIRTH:  04-27-1965  SUBJECTIVE:  CHIEF COMPLAINT:   Chief Complaint  Patient presents with  . Fever  . Nausea  . Abdominal Pain   Much more alert this morning, blood pressure stable in the low 100s, pain seems to be controlled  REVIEW OF SYSTEMS:   Review of Systems  Constitutional: Negative for fever.  Respiratory: Negative for shortness of breath.   Cardiovascular: Negative for chest pain and palpitations.  Gastrointestinal: Positive for abdominal pain. Negative for nausea and vomiting.  Genitourinary: Negative for dysuria.    DRUG ALLERGIES:   Allergies  Allergen Reactions  . Hydrocodone Itching    Other reaction(s): ITCHING  . Vicodin [Hydrocodone-Acetaminophen] Rash    VITALS:  Blood pressure 115/85, pulse 80, temperature 98.3 F (36.8 C), temperature source Axillary, resp. rate 21, height 5' 2"  (1.575 m), weight 113.399 kg (250 lb), SpO2 95 %.  PHYSICAL EXAMINATION:  GENERAL:  49 y.o.-year-old patient lying in the bed, no distress EYES: Pupils equal, round, enlarged, reactive to light and accommodation. No scleral icterus. Extraocular muscles intact.  HEENT: Head atraumatic, normocephalic. Oropharynx and nasopharynx clear. His membranes are moist NECK:  Supple, no jugular venous distention. No thyroid enlargement, no tenderness.  LUNGS: Normal breath sounds bilaterally, short shallow respirations, no respiratory distress  CARDIOVASCULAR: S1, S2 normal. No murmurs, rubs, or gallops.  ABDOMEN: Soft, minimal tenderness to palpation throughout, nondistended. Bowel sounds present. No organomegaly or mass. Right upper quadrant drain in place with serosanguineous drainage into the drain, no leakage EXTREMITIES: No pedal edema, cyanosis, or clubbing.  NEUROLOGIC: Cranial nerves II through XII are intact. Muscle  strength 5/5 in all extremities. Sensation intact. Gait not checked.  PSYCHIATRIC: The patient is alert and oriented x 3.  SKIN: No obvious rash, lesion, or ulcer. Right upper quadrant drain bandaged with no obvious leakage   LABORATORY PANEL:   CBC  Recent Labs Lab 08/07/14 0622  WBC 6.4  HGB 9.4*  HCT 27.9*  PLT 178   ------------------------------------------------------------------------------------------------------------------  Chemistries   Recent Labs Lab 08/05/14 1225  08/07/14 0622  NA  --   < > 140  K  --   < > 3.8  CL  --   < > 108  CO2  --   < > 27  GLUCOSE  --   < > 109*  BUN  --   < > 5*  CREATININE  --   < > 0.49  CALCIUM  --   < > 7.8*  MG 2.1  --   --   AST  --   --  22  ALT  --   --  11*  ALKPHOS  --   --  42  BILITOT  --   --  0.4  < > = values in this interval not displayed. ------------------------------------------------------------------------------------------------------------------  Cardiac Enzymes No results for input(s): TROPONINI in the last 168 hours. ------------------------------------------------------------------------------------------------------------------  RADIOLOGY:  Ct Chest W Contrast  08/07/2014   CLINICAL DATA:  Acute onset of worsening right upper quadrant abdominal pain, nausea and vomiting. Fever. Abscess at the gallbladder fossa, status post CT-guided drainage. Fever and hypotension, with severe abdominal pain. Initial encounter.  EXAM: CT CHEST, ABDOMEN, AND PELVIS WITH CONTRAST  TECHNIQUE: Multidetector CT imaging of the chest, abdomen and pelvis was performed following the standard protocol during bolus administration of  intravenous contrast.  CONTRAST:  139m OMNIPAQUE IOHEXOL 350 MG/ML SOLN  COMPARISON:  CT of the abdomen and pelvis performed 08/03/2014  FINDINGS: CT CHEST FINDINGS  Mild bilateral dependent subsegmental atelectasis is noted. The lungs are otherwise grossly clear. No pleural effusion or pneumothorax is  seen. No masses are identified.  A left subclavian line is noted ending about the distal SVC. The mediastinum is unremarkable in appearance. No mediastinal lymphadenopathy is seen. No pericardial effusion is identified. The great vessels are grossly unremarkable in appearance. The visualized portions of the thyroid gland are unremarkable. No axillary lymphadenopathy is seen.  No acute osseous abnormalities are identified.  CT ABDOMEN AND PELVIS FINDINGS  There is new small to moderate volume ascites noted tracking about the abdomen and pelvis. Some of this demonstrates increased attenuation. Given recent placement of a drainage catheter at the right upper quadrant and return of purulent fluid from a large abscess, this is concerning for leakage of purulent fluid across the abdomen, and raises question for peritonitis. This would correspond to the clinically described severe sepsis and shock. Would correlate for associated abdominal symptoms.  A 2.1 cm hypodensity within the lateral aspect of the left hepatic lobe is stable from the prior study and likely reflects a cyst. The spleen is enlarged, measuring 21.7 cm in length. Residual debris and soft tissue inflammation are noted at the gallbladder fossa. The patient is status post cholecystectomy. No definite residual drainable abscess is seen. The patient's right upper quadrant pigtail catheter ends adjacent to the gallbladder fossa. The pancreas and adrenal glands are unremarkable. A common bile duct stent is noted extending into the third segment of the duodenum. This is grossly unchanged from the prior study.  The kidneys are unremarkable in appearance. There is no evidence of hydronephrosis. No renal or ureteral stones are seen. No perinephric stranding is appreciated.  No free fluid is identified. The small bowel is unremarkable in appearance. The stomach is within normal limits. No acute vascular abnormalities are seen.  The appendix is normal in caliber and  contains residual contrast. Minimal diverticulosis is noted along the proximal sigmoid colon. The colon is otherwise unremarkable.  The bladder is mildly distended and grossly unremarkable. The patient is status post hysterectomy. The ovaries are grossly symmetric. No suspicious adnexal masses are seen. No inguinal lymphadenopathy is seen.  No acute osseous abnormalities are identified. Mild facet disease is noted at the lower lumbar spine.  IMPRESSION: 1. New small to moderate volume ascites tracking about the abdomen and pelvis. Some of this demonstrates increased attenuation. Given recent placement of the drainage catheter at the right upper quadrant and return of purulent fluid from a large abscess, this is concerning for leakage of purulent fluid across the abdomen, and raises question for peritonitis. This would correspond to clinically described severe sepsis and shock. Would correlate for associated abdominal symptoms. 2. Residual debris and soft tissue inflammation at the gallbladder fossa. No definite residual drainable abscess seen at this time. Right upper quadrant pigtail catheter ends adjacent to the gallbladder fossa. Common bile duct stent is grossly unchanged from the prior study. 3. Marked splenomegaly.  Hepatic cyst again noted. 4. Minimal bilateral dependent subsegmental atelectasis. Lungs otherwise clear. 5. Minimal diverticulosis along the proximal sigmoid colon.  These results were called by telephone at the time of interpretation on 08/07/2014 at 2:14 am to [facility], who verbally acknowledged these results.   Electronically Signed   By: JFrancoise SchaumannD.  On: 08/07/2014 02:33   Ct Abdomen Pelvis W Contrast  08/07/2014   CLINICAL DATA:  Acute onset of worsening right upper quadrant abdominal pain, nausea and vomiting. Fever. Abscess at the gallbladder fossa, status post CT-guided drainage. Fever and hypotension, with severe abdominal pain. Initial encounter.   EXAM: CT CHEST, ABDOMEN, AND PELVIS WITH CONTRAST  TECHNIQUE: Multidetector CT imaging of the chest, abdomen and pelvis was performed following the standard protocol during bolus administration of intravenous contrast.  CONTRAST:  154m OMNIPAQUE IOHEXOL 350 MG/ML SOLN  COMPARISON:  CT of the abdomen and pelvis performed 08/03/2014  FINDINGS: CT CHEST FINDINGS  Mild bilateral dependent subsegmental atelectasis is noted. The lungs are otherwise grossly clear. No pleural effusion or pneumothorax is seen. No masses are identified.  A left subclavian line is noted ending about the distal SVC. The mediastinum is unremarkable in appearance. No mediastinal lymphadenopathy is seen. No pericardial effusion is identified. The great vessels are grossly unremarkable in appearance. The visualized portions of the thyroid gland are unremarkable. No axillary lymphadenopathy is seen.  No acute osseous abnormalities are identified.  CT ABDOMEN AND PELVIS FINDINGS  There is new small to moderate volume ascites noted tracking about the abdomen and pelvis. Some of this demonstrates increased attenuation. Given recent placement of a drainage catheter at the right upper quadrant and return of purulent fluid from a large abscess, this is concerning for leakage of purulent fluid across the abdomen, and raises question for peritonitis. This would correspond to the clinically described severe sepsis and shock. Would correlate for associated abdominal symptoms.  A 2.1 cm hypodensity within the lateral aspect of the left hepatic lobe is stable from the prior study and likely reflects a cyst. The spleen is enlarged, measuring 21.7 cm in length. Residual debris and soft tissue inflammation are noted at the gallbladder fossa. The patient is status post cholecystectomy. No definite residual drainable abscess is seen. The patient's right upper quadrant pigtail catheter ends adjacent to the gallbladder fossa. The pancreas and adrenal glands are  unremarkable. A common bile duct stent is noted extending into the third segment of the duodenum. This is grossly unchanged from the prior study.  The kidneys are unremarkable in appearance. There is no evidence of hydronephrosis. No renal or ureteral stones are seen. No perinephric stranding is appreciated.  No free fluid is identified. The small bowel is unremarkable in appearance. The stomach is within normal limits. No acute vascular abnormalities are seen.  The appendix is normal in caliber and contains residual contrast. Minimal diverticulosis is noted along the proximal sigmoid colon. The colon is otherwise unremarkable.  The bladder is mildly distended and grossly unremarkable. The patient is status post hysterectomy. The ovaries are grossly symmetric. No suspicious adnexal masses are seen. No inguinal lymphadenopathy is seen.  No acute osseous abnormalities are identified. Mild facet disease is noted at the lower lumbar spine.  IMPRESSION: 1. New small to moderate volume ascites tracking about the abdomen and pelvis. Some of this demonstrates increased attenuation. Given recent placement of the drainage catheter at the right upper quadrant and return of purulent fluid from a large abscess, this is concerning for leakage of purulent fluid across the abdomen, and raises question for peritonitis. This would correspond to clinically described severe sepsis and shock. Would correlate for associated abdominal symptoms. 2. Residual debris and soft tissue inflammation at the gallbladder fossa. No definite residual drainable abscess seen at this time. Right upper quadrant pigtail catheter ends adjacent to the gallbladder  fossa. Common bile duct stent is grossly unchanged from the prior study. 3. Marked splenomegaly.  Hepatic cyst again noted. 4. Minimal bilateral dependent subsegmental atelectasis. Lungs otherwise clear. 5. Minimal diverticulosis along the proximal sigmoid colon.  These results were called by  telephone at the time of interpretation on 08/07/2014 at 2:14 am to Nursing in the [facility] ICU, who verbally acknowledged these results.   Electronically Signed   By: Garald Balding M.D.   On: 08/07/2014 02:33   Dg Chest Port 1 View  08/06/2014   CLINICAL DATA:  Central line placement.  Initial encounter.  EXAM: PORTABLE CHEST - 1 VIEW  COMPARISON:  Chest radiograph performed 07/18/2014  FINDINGS: The patient's left subclavian line is noted ending about the mid SVC.  The lungs are hypoexpanded. Vascular congestion is noted. Minimal bilateral atelectasis is seen. No pleural effusion or pneumothorax is identified.  The cardiomediastinal silhouette is borderline enlarged. No acute osseous abnormalities are identified.  IMPRESSION: 1. Left subclavian line noted ending about the mid SVC. 2. Lungs hypoexpanded. Vascular congestion and borderline cardiomegaly noted. Minimal bilateral atelectasis seen.   Electronically Signed   By: Garald Balding M.D.   On: 08/06/2014 20:14    EKG:   Orders placed or performed during the hospital encounter of 07/16/14  . EKG 12-Lead  . EKG 12-Lead  . EKG 12-Lead  . EKG 12-Lead  . EKG    ASSESSMENT AND PLAN:   #1 hypotension: Improved - Concern for sepsis, did have fever overnight, blood cultures negative, continue Unasyn - Has been responding to fluids, no pressors needed - Hold narcotics and antihypertensives  #2 intra-abdominal infection with abscess now s/p drain on 8/1 - febrile overnight again - Blood cultures pending, repeat imaging shows very small fluid collection - abscess aspirate growing Klebsiella and group B strep, continue Unasyn - Infectious disease consultation appreciated  #3 acute postoperative anemia due to blood loss - Hemoglobin has gone from 11.5-9.1>9.4  #4 type 2 diabetes mellitus -  sliding scale, CBGs well-controlled  #5  COPD (chronic obstructive pulmonary disease) - continue home inhalers. No overt exacerbation at this  time  #6  OSA on CPAP - CPAP daily at bedtime while here  #7  GERD (gastroesophageal reflux disease) - equivalent home dose PPI  #8 Anxiety  - With multiple current stressors including her husband also being [facility] - continue home medications - Appreciate psychiatry consultation  #9 diarrhea - Possibly due to recent cholecystectomy, check C. difficile and stool cultures  Disposition: Transfer to floor, blood pressure seems stable and improving.  CODE STATUS: Full  TOTAL TIME TAKING CARE OF THIS PATIENT: 45 minutes.   POSSIBLE D/C IN 3-4 DAYS, DEPENDING ON CLINICAL CONDITION.  Myrtis Ser M.D on 08/07/2014 at 3:19 PM  Between 7am to 6pm - Pager - [phone]  After 6pm go to [email or web address] - password [email or web address] [facility]  Office  [phone]  CC: Primary care physician; Ashkin, Neldon Labella, MD

## 2014-08-07 NOTE — Progress Notes (Signed)
RN notified Dr. Marina Gravel that patient is complaining of pain and asking for more pain medicine. RN made MD aware that patient was just medicated with percocet at 1617 for c/o abdominal pain. Dr. Marina Gravel stated "im not giving her anything else for pain." no new orders.

## 2014-08-07 NOTE — Progress Notes (Signed)
   08/07/14 1645  Clinical Encounter Type  Visited With Patient and family together  Visit Type Initial  Consult/Referral To Chaplain  Spiritual Encounters  Spiritual Needs Emotional  Stress Factors  Patient Stress Factors Other (Comment)  Chaplain rounded in the unit and offered a compassionate presence to the patient and family. Offered services but declined but did express pain. Informed the nurse. Chaplain Tniyah Nakagawa A. Trinnity Breunig [phone]

## 2014-08-07 NOTE — Progress Notes (Signed)
Patient ID: Lori Mckenzie, female   DOB: 03-20-1965, 49 y.o.   MRN: [id number]  Events noted No pressors needed Central line placed abd soft, patient sleeping CT scans reviewed. BP 115/85 C/o diarrhea. Spoke with Advice worker.   I/O last 3 completed shifts: In: 5160 [P.O.:360; I.V.:1500; IV Piggyback:3300] Out: 1500 [Urine:1400; Drains:100] Total I/O In: 535 [P.O.:60; I.V.:375; IV Piggyback:100] Out: -    CBC Latest Ref Rng 08/07/2014 08/06/2014 08/05/2014  WBC 3.6 - 11.0 K/uL 6.4 5.9 5.2  Hemoglobin 12.0 - 16.0 g/dL 9.4(L) 9.4(L) 10.0(L)  Hematocrit 35.0 - 47.0 % 27.9(L) 28.3(L) 30.4(L)  Platelets 150 - 440 K/uL 178 181 158    Jp non-bilious  IMP:  Fevers, resolved No new intrabominal fluid collections, looks like ascites  Plan:  Move to floor. Dc fentanyl. Only oral narcotics. Psych and IM and ID  to follow.   Will discuss with Dr Volanda Napoleon Await c diff. Assay.

## 2014-08-07 NOTE — Progress Notes (Signed)
RN made Dr. Volanda Napoleon aware that patient has had multiple loose stools and that she had not ordered nausea medicine.  RN also made MD aware that patient is asking for something for diarrhea.  Dr. Volanda Napoleon stated "I dont want to give her anything for diarrhea until we know if it's cdiff or not." MD to order med for nausea and cdiff orders.

## 2014-08-07 NOTE — Progress Notes (Signed)
Radiologist called with results for CT of abd, noted diffuse ascites, purulent drainage in abdomen/pelvis, concerned about peritonitis. Notified Dr. Burt Knack who stated they would probably place another drain in the morning. VS have remained stable throughout shift.

## 2014-08-08 ENCOUNTER — Other Ambulatory Visit: Payer: Self-pay | Admitting: *Deleted

## 2014-08-08 DIAGNOSIS — K810 Acute cholecystitis: Secondary | ICD-10-CM

## 2014-08-08 LAB — BASIC METABOLIC PANEL
Anion gap: 5 (ref 5–15)
BUN: <5 mg/dL — ABNORMAL LOW (ref 6–20)
CO2: 27 mmol/L (ref 22–32)
Calcium: 8.2 mg/dL — ABNORMAL LOW (ref 8.9–10.3)
Chloride: 109 mmol/L (ref 101–111)
Creatinine, Ser: 0.47 mg/dL (ref 0.44–1.00)
GFR calc Af Amer: >60 mL/min (ref >60)
GFR calc non Af Amer: >60 mL/min (ref >60)
Glucose, Bld: 91 mg/dL (ref 65–99)
Potassium: 4.1 mmol/L (ref 3.5–5.1)
Sodium: 141 mmol/L (ref 135–145)

## 2014-08-08 LAB — CBC
HCT: 26.6 % — ABNORMAL LOW (ref 35.0–47.0)
Hemoglobin: 8.7 g/dL — ABNORMAL LOW (ref 12.0–16.0)
MCH: 30.5 pg (ref 26.0–34.0)
MCHC: 32.6 g/dL (ref 32.0–36.0)
MCV: 93.6 fL (ref 80.0–100.0)
Platelets: 172 10*3/uL (ref 150–440)
RBC: 2.84 MIL/uL — ABNORMAL LOW (ref 3.80–5.20)
RDW: 14.4 % (ref 11.5–14.5)
WBC: 5.2 10*3/uL (ref 3.6–11.0)

## 2014-08-08 LAB — PROCALCITONIN: Procalcitonin: 0.94 ng/mL

## 2014-08-08 LAB — GLUCOSE, CAPILLARY
Glucose-Capillary: 99 mg/dL (ref 65–99)
Glucose-Capillary: 98 mg/dL (ref 65–99)

## 2014-08-08 MED ORDER — AMOXICILLIN-POT CLAVULANATE 875-125 MG PO TABS
1.0000 | ORAL_TABLET | Freq: Two times a day (BID) | ORAL | Status: DC
  Administered 2014-08-08: 1 via ORAL
  Filled 2014-08-08: qty 1

## 2014-08-08 MED ORDER — OXYCODONE-ACETAMINOPHEN 5-325 MG PO TABS: 1.0000 | ORAL_TABLET | Freq: Four times a day (QID) | ORAL | Status: DC | PRN

## 2014-08-08 MED ORDER — AMOXICILLIN-POT CLAVULANATE 875-125 MG PO TABS: 1.0000 | ORAL_TABLET | Freq: Two times a day (BID) | ORAL | Status: DC

## 2014-08-08 NOTE — Care Management (Signed)
Sharmon Revere with [facility] notified of patients pending discharge.

## 2014-08-08 NOTE — Progress Notes (Signed)
[place] at [facility] Internal medicine consult follow-up note   PATIENT NAME: Lori Mckenzie    MR#:  [id number]  DATE OF BIRTH:  [birth year]  SUBJECTIVE:  CHIEF COMPLAINT:   Chief Complaint  Patient presents with  . Fever  . Nausea  . Abdominal Pain   Afebrile overnight, blood pressure stable. Alert awake. Does complain of pain in the right side of the abdomen.  REVIEW OF SYSTEMS:   Review of Systems  Constitutional: Negative for fever.  Respiratory: Negative for shortness of breath.   Cardiovascular: Negative for chest pain and palpitations.  Gastrointestinal: Positive for abdominal pain. Negative for nausea and vomiting.  Genitourinary: Negative for dysuria.    DRUG ALLERGIES:   Allergies  Allergen Reactions  . Hydrocodone Itching    Other reaction(s): ITCHING  . Vicodin [Hydrocodone-Acetaminophen] Rash    VITALS:  Blood pressure 116/66, pulse 76, temperature 98.6 F (37 C), temperature source Oral, resp. rate 16, height 5' 2"  (1.575 m), weight 113.399 kg (250 lb), SpO2 97 %.  PHYSICAL EXAMINATION:  GENERAL:  49 y.o.-year-old patient sitting up in chair, no distress, obese LUNGS: Normal breath sounds bilaterally, short shallow respirations, no respiratory distress  CARDIOVASCULAR: S1, S2 normal. No murmurs, rubs, or gallops.  ABDOMEN: Soft, minimal tenderness to palpation over the right upper and lower quadrants, nondistended. Bowel sounds present. No organomegaly or mass. Right sided drain in place with serosanguineous drainage into the drain, no leakage EXTREMITIES: No pedal edema, cyanosis, or clubbing.  NEUROLOGIC: Cranial nerves II through XII are intact. Muscle strength 5/5 in all extremities. Sensation intact. Gait not checked.  PSYCHIATRIC: The patient is alert and oriented x 3. Anxious SKIN: No obvious rash, lesion, or ulcer. Right upper quadrant drain bandaged with no obvious leakage   LABORATORY PANEL:    CBC  Recent Labs Lab 08/08/14 0556  WBC 5.2  HGB 8.7*  HCT 26.6*  PLT 172   ------------------------------------------------------------------------------------------------------------------  Chemistries   Recent Labs Lab 08/05/14 1225  08/07/14 0622 08/08/14 0556  NA  --   < > 140 141  K  --   < > 3.8 4.1  CL  --   < > 108 109  CO2  --   < > 27 27  GLUCOSE  --   < > 109* 91  BUN  --   < > 5* <5*  CREATININE  --   < > 0.49 0.47  CALCIUM  --   < > 7.8* 8.2*  MG 2.1  --   --   --   AST  --   --  22  --   ALT  --   --  11*  --   ALKPHOS  --   --  42  --   BILITOT  --   --  0.4  --   < > = values in this interval not displayed. ------------------------------------------------------------------------------------------------------------------  Cardiac Enzymes No results for input(s): TROPONINI in the last 168 hours. ------------------------------------------------------------------------------------------------------------------  RADIOLOGY:  Ct Chest W Contrast  08/07/2014   CLINICAL DATA:  Acute onset of worsening right upper quadrant abdominal pain, nausea and vomiting. Fever. Abscess at the gallbladder fossa, status post CT-guided drainage. Fever and hypotension, with severe abdominal pain. Initial encounter.  EXAM: CT CHEST, ABDOMEN, AND PELVIS WITH CONTRAST  TECHNIQUE: Multidetector CT imaging of the chest, abdomen and pelvis was performed following the standard protocol during bolus administration of intravenous contrast.  CONTRAST:  185m OMNIPAQUE IOHEXOL 350 MG/ML  SOLN  COMPARISON:  CT of the abdomen and pelvis performed 08/03/2014  FINDINGS: CT CHEST FINDINGS  Mild bilateral dependent subsegmental atelectasis is noted. The lungs are otherwise grossly clear. No pleural effusion or pneumothorax is seen. No masses are identified.  A left subclavian line is noted ending about the distal SVC. The mediastinum is unremarkable in appearance. No mediastinal lymphadenopathy is  seen. No pericardial effusion is identified. The great vessels are grossly unremarkable in appearance. The visualized portions of the thyroid gland are unremarkable. No axillary lymphadenopathy is seen.  No acute osseous abnormalities are identified.  CT ABDOMEN AND PELVIS FINDINGS  There is new small to moderate volume ascites noted tracking about the abdomen and pelvis. Some of this demonstrates increased attenuation. Given recent placement of a drainage catheter at the right upper quadrant and return of purulent fluid from a large abscess, this is concerning for leakage of purulent fluid across the abdomen, and raises question for peritonitis. This would correspond to the clinically described severe sepsis and shock. Would correlate for associated abdominal symptoms.  A 2.1 cm hypodensity within the lateral aspect of the left hepatic lobe is stable from the prior study and likely reflects a cyst. The spleen is enlarged, measuring 21.7 cm in length. Residual debris and soft tissue inflammation are noted at the gallbladder fossa. The patient is status post cholecystectomy. No definite residual drainable abscess is seen. The patient's right upper quadrant pigtail catheter ends adjacent to the gallbladder fossa. The pancreas and adrenal glands are unremarkable. A common bile duct stent is noted extending into the third segment of the duodenum. This is grossly unchanged from the prior study.  The kidneys are unremarkable in appearance. There is no evidence of hydronephrosis. No renal or ureteral stones are seen. No perinephric stranding is appreciated.  No free fluid is identified. The small bowel is unremarkable in appearance. The stomach is within normal limits. No acute vascular abnormalities are seen.  The appendix is normal in caliber and contains residual contrast. Minimal diverticulosis is noted along the proximal sigmoid colon. The colon is otherwise unremarkable.  The bladder is mildly distended and grossly  unremarkable. The patient is status post hysterectomy. The ovaries are grossly symmetric. No suspicious adnexal masses are seen. No inguinal lymphadenopathy is seen.  No acute osseous abnormalities are identified. Mild facet disease is noted at the lower lumbar spine.  IMPRESSION: 1. New small to moderate volume ascites tracking about the abdomen and pelvis. Some of this demonstrates increased attenuation. Given recent placement of the drainage catheter at the right upper quadrant and return of purulent fluid from a large abscess, this is concerning for leakage of purulent fluid across the abdomen, and raises question for peritonitis. This would correspond to clinically described severe sepsis and shock. Would correlate for associated abdominal symptoms. 2. Residual debris and soft tissue inflammation at the gallbladder fossa. No definite residual drainable abscess seen at this time. Right upper quadrant pigtail catheter ends adjacent to the gallbladder fossa. Common bile duct stent is grossly unchanged from the prior study. 3. Marked splenomegaly.  Hepatic cyst again noted. 4. Minimal bilateral dependent subsegmental atelectasis. Lungs otherwise clear. 5. Minimal diverticulosis along the proximal sigmoid colon.  These results were called by telephone at the time of interpretation on 08/07/2014 at 2:14 am to Nursing in the [facility] ICU, who verbally acknowledged these results.   Electronically Signed   By: Garald Balding M.D.   On: 08/07/2014 02:33   Ct Abdomen Pelvis W  Contrast  08/07/2014   CLINICAL DATA:  Acute onset of worsening right upper quadrant abdominal pain, nausea and vomiting. Fever. Abscess at the gallbladder fossa, status post CT-guided drainage. Fever and hypotension, with severe abdominal pain. Initial encounter.  EXAM: CT CHEST, ABDOMEN, AND PELVIS WITH CONTRAST  TECHNIQUE: Multidetector CT imaging of the chest, abdomen and pelvis was performed following the standard protocol during bolus  administration of intravenous contrast.  CONTRAST:  149m OMNIPAQUE IOHEXOL 350 MG/ML SOLN  COMPARISON:  CT of the abdomen and pelvis performed 08/03/2014  FINDINGS: CT CHEST FINDINGS  Mild bilateral dependent subsegmental atelectasis is noted. The lungs are otherwise grossly clear. No pleural effusion or pneumothorax is seen. No masses are identified.  A left subclavian line is noted ending about the distal SVC. The mediastinum is unremarkable in appearance. No mediastinal lymphadenopathy is seen. No pericardial effusion is identified. The great vessels are grossly unremarkable in appearance. The visualized portions of the thyroid gland are unremarkable. No axillary lymphadenopathy is seen.  No acute osseous abnormalities are identified.  CT ABDOMEN AND PELVIS FINDINGS  There is new small to moderate volume ascites noted tracking about the abdomen and pelvis. Some of this demonstrates increased attenuation. Given recent placement of a drainage catheter at the right upper quadrant and return of purulent fluid from a large abscess, this is concerning for leakage of purulent fluid across the abdomen, and raises question for peritonitis. This would correspond to the clinically described severe sepsis and shock. Would correlate for associated abdominal symptoms.  A 2.1 cm hypodensity within the lateral aspect of the left hepatic lobe is stable from the prior study and likely reflects a cyst. The spleen is enlarged, measuring 21.7 cm in length. Residual debris and soft tissue inflammation are noted at the gallbladder fossa. The patient is status post cholecystectomy. No definite residual drainable abscess is seen. The patient's right upper quadrant pigtail catheter ends adjacent to the gallbladder fossa. The pancreas and adrenal glands are unremarkable. A common bile duct stent is noted extending into the third segment of the duodenum. This is grossly unchanged from the prior study.  The kidneys are unremarkable in  appearance. There is no evidence of hydronephrosis. No renal or ureteral stones are seen. No perinephric stranding is appreciated.  No free fluid is identified. The small bowel is unremarkable in appearance. The stomach is within normal limits. No acute vascular abnormalities are seen.  The appendix is normal in caliber and contains residual contrast. Minimal diverticulosis is noted along the proximal sigmoid colon. The colon is otherwise unremarkable.  The bladder is mildly distended and grossly unremarkable. The patient is status post hysterectomy. The ovaries are grossly symmetric. No suspicious adnexal masses are seen. No inguinal lymphadenopathy is seen.  No acute osseous abnormalities are identified. Mild facet disease is noted at the lower lumbar spine.  IMPRESSION: 1. New small to moderate volume ascites tracking about the abdomen and pelvis. Some of this demonstrates increased attenuation. Given recent placement of the drainage catheter at the right upper quadrant and return of purulent fluid from a large abscess, this is concerning for leakage of purulent fluid across the abdomen, and raises question for peritonitis. This would correspond to clinically described severe sepsis and shock. Would correlate for associated abdominal symptoms. 2. Residual debris and soft tissue inflammation at the gallbladder fossa. No definite residual drainable abscess seen at this time. Right upper quadrant pigtail catheter ends adjacent to the gallbladder fossa. Common bile duct stent is grossly unchanged from  the prior study. 3. Marked splenomegaly.  Hepatic cyst again noted. 4. Minimal bilateral dependent subsegmental atelectasis. Lungs otherwise clear. 5. Minimal diverticulosis along the proximal sigmoid colon.  These results were called by telephone at the time of interpretation on 08/07/2014 at 2:14 am to Nursing in the [facility] ICU, who verbally acknowledged these results.   Electronically Signed   By: Garald Balding M.D.   On: 08/07/2014 02:33   Dg Chest Port 1 View  08/06/2014   CLINICAL DATA:  Central line placement.  Initial encounter.  EXAM: PORTABLE CHEST - 1 VIEW  COMPARISON:  Chest radiograph performed 07/18/2014  FINDINGS: The patient's left subclavian line is noted ending about the mid SVC.  The lungs are hypoexpanded. Vascular congestion is noted. Minimal bilateral atelectasis is seen. No pleural effusion or pneumothorax is identified.  The cardiomediastinal silhouette is borderline enlarged. No acute osseous abnormalities are identified.  IMPRESSION: 1. Left subclavian line noted ending about the mid SVC. 2. Lungs hypoexpanded. Vascular congestion and borderline cardiomegaly noted. Minimal bilateral atelectasis seen.   Electronically Signed   By: Garald Balding M.D.   On: 08/06/2014 20:14    EKG:   Orders placed or performed during the hospital encounter of 07/16/14  . EKG 12-Lead  . EKG 12-Lead  . EKG 12-Lead  . EKG 12-Lead  . EKG    ASSESSMENT AND PLAN:   #1 hypotension: Improved - She responded well to fluid resuscitation and holding of opiates. Blood cultures have been negative. Agree with infectious disease that we could transition from Unasyn to Augmentin at this point. Would avoid further IV opiates.  #2 intra-abdominal infection with abscess now s/p drain on 8/1: Afebrile for 24 hours. Blood cultures negative so far. Infectious disease consultation appreciated. Transition from Unasyn to Augmentin today. Follow up as an outpatient with surgery  #3 acute postoperative anemia due to blood loss - Hemoglobin has gone from 11.5-9.1>9.4> 8.7 after significant volume replacement. No signs of bleeding. Stable.  #4 type 2 diabetes mellitus -  sliding scale, CBGs well-controlled  #5  COPD (chronic obstructive pulmonary disease) - continue home inhalers. No overt exacerbation at this time  #6  OSA on CPAP - CPAP daily at bedtime while here  #7  GERD (gastroesophageal reflux disease)  - equivalent home dose PPI  #8 Anxiety  - With multiple current stressors including her husband also being admitted at [facility] - continue home medications - Appreciate psychiatry consultation. She will need continued psychiatric support  #9 diarrhea: Resolved  Of note I had a long discussion with this patient regarding opiates. She has formerly been on high-dose opiates for several years and had just weaned off prior to her surgery. She does have an opiate tolerance, but I cautioned her on escalating doses as this would calm with increased side effects. I do feel that her hypotension earlier during this hospitalization was very likely due to opiate therapy. I agree with her current dose of Percocet, would not escalate.  Disposition: Agree with discharge for today.  CODE STATUS: Full  TOTAL TIME TAKING CARE OF THIS PATIENT: 45 minutes.   POSSIBLE D/C IN 3-4 DAYS, DEPENDING ON CLINICAL CONDITION.  Myrtis Ser M.D on 08/08/2014 at 10:01 AM  Between 7am to 6pm - Pager - [phone]  After 6pm go to [email or web address] - password [email or web address] [place] [facility]  Office  [phone]  CC: Primary care physician; Ashkin, Neldon Labella, MD

## 2014-08-08 NOTE — Progress Notes (Signed)
Pt d/c home; d/c instructions reviewed w/ pt; pt understanding was verbalized; PICC line removed, catheter in tact, sterile gauze dressing applied; all pt questions answered; pt left unit via wheelchair accompanied by staff

## 2014-08-08 NOTE — Discharge Instructions (Signed)
Notify MD for any worsening abdominal pain, fever of 100.5 or higher, worsening drainage from the drain or around the drain.  Monitor the drainage output and empty it at least twice a day and keep up with the amount for when you go to your follow-up appointment.  Take all medications as prescribed.  Be sure to take the full regimen of antibiotics.  Be sure to keep your follow-up appointment.

## 2014-08-08 NOTE — Discharge Summary (Signed)
Physician Discharge Summary  Patient ID: Lori Mckenzie MRN: [id number] DOB/AGE: [birth year] 49 y.o.  Admit date: 08/03/2014 Discharge date: 08/08/2014  Admission Diagnoses: Right upper quadrant gallbladder fossa abscess.  Discharge Diagnoses:  Active Problems:   Anxiety   Abscess of abdominal cavity   Dysthymia   Other social stressor   Discharged Condition: Stable and improved  Hospital Course: The patient was admitted on July 31. She was prepared for interventional radiology to place a percutaneous drainage catheter into a large right upper quadrant/gallbladder fossa abscess containing both air and fluid. She tolerated this well on August 1. Once again, pain control was a major issue. She did have some postoperative fevers which abated. Infectious disease as well as internal medicine did become involved because of blood pressure issues and persistent fevers.  She was continued on Zosyn. She was then switched to oral Augmentin.   Pain was again a major factor. A trial of MS Contin and MSIR demonstrated hypotension for which medicine transferred her briefly to the intensive care unit. A central line was placed by the critical care team. She did not require any IV pressors. She responded to intravenous fluids and discontinuation of his long-acting narcotics. The patient was transferred back to the floor on August 4. She was tolerating a regular diet. Her central line catheter was removed. There was never any bile seen within the percutaneous drain. Home health was arranged for drain management once home. She was discharged home on oral Augmentin on the morning of August 5 on oral Percocet only.  Significant Diagnostic Studies: CT scans  Treatments: See above  Discharge Exam: Blood pressure 116/66, pulse 76, temperature 98.6 F (37 C), temperature source Oral, resp. rate 16, height 5' 2"  (1.575 m), weight 113.399 kg (250 lb), SpO2 97 %. Her abdomen was soft however somewhat tender around the  drainage entrance site. Right upper quadrant percutaneous drainage catheter was in place draining serosanguineous fluid.  Disposition: 01-Home or Self Care with home health care assistance as described above.     Medication List    STOP taking these medications        HYDROmorphone 2 MG tablet  Commonly known as:  DILAUDID      TAKE these medications        albuterol 108 (90 BASE) MCG/ACT inhaler  Commonly known as:  PROVENTIL HFA;VENTOLIN HFA  Inhale 2 puffs into the lungs every 4 (four) hours as needed for wheezing or shortness of breath.     alprazolam 2 MG tablet  Commonly known as:  XANAX  Take 2 mg by mouth 3 (three) times daily as needed for anxiety.     ALPRAZolam 1 MG tablet  Commonly known as:  XANAX  Take 1 tablet (1 mg total) by mouth 3 (three) times daily as needed for anxiety.     amoxicillin-clavulanate 875-125 MG per tablet  Commonly known as:  AUGMENTIN  Take 1 tablet by mouth 2 (two) times daily.     citalopram 20 MG tablet  Commonly known as:  CELEXA  Take 20 mg by mouth daily.     clotrimazole 10 MG troche  Commonly known as:  MYCELEX  Take 10 mg by mouth 4 (four) times daily as needed (mouth dryness).     fluticasone 50 MCG/ACT nasal spray  Commonly known as:  FLONASE  Place 1 spray into both nostrils daily as needed (dryness).     Fluticasone-Salmeterol 100-50 MCG/DOSE Aepb  Commonly known as:  ADVAIR  Inhale 1 puff  into the lungs 2 (two) times daily.     furosemide 40 MG tablet  Commonly known as:  LASIX  Take 80 mg by mouth 2 (two) times daily.     gabapentin 100 MG capsule  Commonly known as:  NEURONTIN  Take 300 mg by mouth 4 (four) times daily as needed (pain).     hydrochlorothiazide 25 MG tablet  Commonly known as:  HYDRODIURIL  Take 12.5 mg by mouth daily as needed (id diastolic blood pressure >64).     ibuprofen 800 MG tablet  Commonly known as:  ADVIL,MOTRIN  Take 800 mg by mouth every 8 (eight) hours as needed for moderate  pain.     ipratropium 17 MCG/ACT inhaler  Commonly known as:  ATROVENT HFA  Inhale 2 puffs into the lungs every 8 (eight) hours.     metFORMIN 1000 MG tablet  Commonly known as:  GLUCOPHAGE  Take 500-1,000 mg by mouth 2 (two) times daily. Dose range based on blood sugar     omeprazole 20 MG capsule  Commonly known as:  PRILOSEC  Take 40 mg by mouth 2 (two) times daily as needed (acid reflux).     oxyCODONE-acetaminophen 5-325 MG per tablet  Commonly known as:  PERCOCET/ROXICET  Take 2 tablets by mouth every 6 (six) hours as needed for severe pain.     OXYGEN  Inhale 1.5-2 L into the lungs continuous.     rizatriptan 10 MG tablet  Commonly known as:  MAXALT  Take 10 mg by mouth as needed for migraine. May repeat in 2 hours if needed     ROBAXIN 500 MG tablet  Generic drug:  methocarbamol  Take 500 mg by mouth 2 (two) times daily as needed for muscle spasms.         SignedSherri Rad 08/08/2014, 9:47 AM

## 2014-08-08 NOTE — Progress Notes (Signed)
Patient ID: Lori Mckenzie, female   DOB: [birth year], 49 y.o.   MRN: [id number]   Surgery  Subjective: She is feeling better. She is tolerating pain medications by mouth. She's had no fevers. She would like to be discharged. Blood pressure is no longer an issue now she is off all of these IV narcotics.   Filed Vitals:   08/07/14 1930 08/07/14 2020 08/08/14 0158 08/08/14 0732  BP:  100/70 115/71 116/66  Pulse:   73 76  Temp: 98.4 F (36.9 C)  98.6 F (37 C)   TempSrc: Oral  Oral   Resp: 21 24 18 16   Height:      Weight:      SpO2:   97% 97%    PE:  Unchanged from yesterday. There is no biloma drain.  Labs  CBC Latest Ref Rng 08/08/2014 08/07/2014 08/06/2014  WBC 3.6 - 11.0 K/uL 5.2 6.4 5.9  Hemoglobin 12.0 - 16.0 g/dL 8.7(L) 9.4(L) 9.4(L)  Hematocrit 35.0 - 47.0 % 26.6(L) 27.9(L) 28.3(L)  Platelets 150 - 440 K/uL 172 178 181   CMP Latest Ref Rng 08/08/2014 08/07/2014 08/06/2014  Glucose 65 - 99 mg/dL 91 109(H) 88  BUN 6 - 20 mg/dL <5(L) 5(L) 7  Creatinine 0.44 - 1.00 mg/dL 0.47 0.49 0.55  Sodium 135 - 145 mmol/L 141 140 138  Potassium 3.5 - 5.1 mmol/L 4.1 3.8 3.6  Chloride 101 - 111 mmol/L 109 108 104  CO2 22 - 32 mmol/L 27 27 27   Calcium 8.9 - 10.3 mg/dL 8.2(L) 7.8(L) 7.7(L)  Total Protein 6.5 - 8.1 g/dL - 6.2(L) -  Total Bilirubin 0.3 - 1.2 mg/dL - 0.4 -  Alkaline Phos 38 - 126 U/L - 42 -  AST 15 - 41 U/L - 22 -  ALT 14 - 54 U/L - 11(L) -     IMP: She is nearly 1 month status post laparoscopic cholecystectomy, No bile leak requiring ERCP and biliary stent followed by drainage of gallbladder fossa abscess. Hypotension has resolved. White count is normal. No more fevers. Cultures are consistent sensitive to Augmentin which infectious disease recommends her to be discharged on.  Plan:  Discharge home follow up next week with interval CT scan. Med reconciliation and orders were performed.

## 2014-08-09 NOTE — Discharge Summary (Signed)
Physician Discharge Summary  Patient ID: Lori Mckenzie MRN: [id number] DOB/AGE: 1965/01/25 49 y.o.  Admit date: 07/12/2014 Discharge date: 07/30/2014  Admission Diagnoses: Abdominal pain Cholelithiasis  Discharge Diagnoses:  Principal Problem:   Biliary colic Active Problems:   Type 2 diabetes mellitus   COPD (chronic obstructive pulmonary disease)   HTN (hypertension)   GERD (gastroesophageal reflux disease)   OSA on CPAP   Anxiety   Abdominal pain   Cholecystitis   Steatohepatitis bile leak.   Discharged Condition: Stable and improved  Hospital Course: The patient was admitted to the medical service with abdominal pain. Consultations with GI medicine and surgery decided that cholelithiasis and cholecystitis was the issue. She is brought to the operating room for cholecystectomy on July 12. Postoperatively she developed a bile leak seen on HIDA scan and was transferred to [facility] at which point an ERCP was performed with bile duct stenting. Postoperatively pain and anxiety was a major issue. She did have a fluid collection seen on CT scan. Percutaneous drain was placed and clear fluid was drained. She became eventually stable for discharge on July 27.  Consults: GI medicine, surgery, internal medicine.  Treatments: Laparoscopic cholecystectomy, ERCP with biliary stenting percutaneous drainage via CT scan.  Disposition: 01-Home or Self Care  Discharge Instructions    Discharge instructions    Complete by:  As directed   During your recent anesthetic, you were given the medication sugammadex (Bridion). This medication interacts with hormonal forms of birth control (oral contraceptives and injected or implanted birth control) and may make them ineffective. IFYOU USE ANY HORMONAL FORM OF BIRTH CONTROL, YOU MUST USE AN ADDITIONAL BARRIER BIRTH CONTROL FOR METHOD FOR SEVEN DAYS after receiving sugammadex (Bridion) or there is a chance you could become pregnant.             Medication List    ASK your doctor about these medications        albuterol 108 (90 BASE) MCG/ACT inhaler  Commonly known as:  PROVENTIL HFA;VENTOLIN HFA  Inhale 2 puffs into the lungs every 4 (four) hours as needed for wheezing or shortness of breath.     alprazolam 2 MG tablet  Commonly known as:  XANAX  Take 2 mg by mouth 3 (three) times daily as needed for anxiety.     citalopram 20 MG tablet  Commonly known as:  CELEXA  Take 20 mg by mouth daily.     clotrimazole 10 MG troche  Commonly known as:  MYCELEX  Take 10 mg by mouth 4 (four) times daily as needed (mouth dryness).     fluticasone 50 MCG/ACT nasal spray  Commonly known as:  FLONASE  Place 1 spray into both nostrils daily as needed (dryness).     Fluticasone-Salmeterol 100-50 MCG/DOSE Aepb  Commonly known as:  ADVAIR  Inhale 1 puff into the lungs 2 (two) times daily.     furosemide 40 MG tablet  Commonly known as:  LASIX  Take 80 mg by mouth 2 (two) times daily.     gabapentin 100 MG capsule  Commonly known as:  NEURONTIN  Take 300 mg by mouth 4 (four) times daily as needed (pain).     hydrochlorothiazide 25 MG tablet  Commonly known as:  HYDRODIURIL  Take 12.5 mg by mouth daily as needed (id diastolic blood pressure >47).     ibuprofen 800 MG tablet  Commonly known as:  ADVIL,MOTRIN  Take 800 mg by mouth every 8 (eight) hours as needed  for moderate pain.     ipratropium 17 MCG/ACT inhaler  Commonly known as:  ATROVENT HFA  Inhale 2 puffs into the lungs every 8 (eight) hours.     metFORMIN 1000 MG tablet  Commonly known as:  GLUCOPHAGE  Take 500-1,000 mg by mouth 2 (two) times daily. Dose range based on blood sugar     omeprazole 20 MG capsule  Commonly known as:  PRILOSEC  Take 40 mg by mouth 2 (two) times daily as needed (acid reflux).     OXYGEN  Inhale 1.5-2 L into the lungs continuous.     rizatriptan 10 MG tablet  Commonly known as:  MAXALT  Take 10 mg by mouth as needed for migraine.  May repeat in 2 hours if needed     ROBAXIN 500 MG tablet  Generic drug:  methocarbamol  Take 500 mg by mouth 2 (two) times daily as needed for muscle spasms.         SignedSherri Rad 08/09/2014, 2:28 PM

## 2014-08-11 LAB — CULTURE, BLOOD (ROUTINE X 2)
Culture: NO GROWTH
Culture: NO GROWTH

## 2014-08-11 LAB — ANAEROBIC CULTURE

## 2014-08-12 ENCOUNTER — Telehealth: Payer: Self-pay | Admitting: Surgery

## 2014-08-12 ENCOUNTER — Emergency Department
Admission: EM | Admit: 2014-08-12 | Discharge: 2014-08-12 | Disposition: A | Discharge status: Home / Self Care | Payer: Medicaid Other | Attending: Emergency Medicine | Admitting: Emergency Medicine

## 2014-08-12 ENCOUNTER — Ambulatory Visit
Admit: 2014-08-12 | Discharge: 2014-08-12 | Disposition: A | Discharge status: Home / Self Care | Payer: Medicaid Other | Attending: Surgery | Admitting: Surgery

## 2014-08-12 DIAGNOSIS — K651 Peritoneal abscess: Secondary | ICD-10-CM

## 2014-08-12 DIAGNOSIS — N189 Chronic kidney disease, unspecified: Secondary | ICD-10-CM | POA: Diagnosis not present

## 2014-08-12 DIAGNOSIS — Z79899 Other long term (current) drug therapy: Secondary | ICD-10-CM | POA: Diagnosis not present

## 2014-08-12 DIAGNOSIS — Z7951 Long term (current) use of inhaled steroids: Secondary | ICD-10-CM | POA: Insufficient documentation

## 2014-08-12 DIAGNOSIS — L02211 Cutaneous abscess of abdominal wall: Secondary | ICD-10-CM | POA: Insufficient documentation

## 2014-08-12 DIAGNOSIS — K6389 Other specified diseases of intestine: Secondary | ICD-10-CM | POA: Insufficient documentation

## 2014-08-12 DIAGNOSIS — K810 Acute cholecystitis: Secondary | ICD-10-CM

## 2014-08-12 DIAGNOSIS — Z9689 Presence of other specified functional implants: Secondary | ICD-10-CM | POA: Diagnosis present

## 2014-08-12 DIAGNOSIS — Z792 Long term (current) use of antibiotics: Secondary | ICD-10-CM | POA: Insufficient documentation

## 2014-08-12 DIAGNOSIS — Z4801 Encounter for change or removal of surgical wound dressing: Secondary | ICD-10-CM | POA: Diagnosis present

## 2014-08-12 DIAGNOSIS — I129 Hypertensive chronic kidney disease with stage 1 through stage 4 chronic kidney disease, or unspecified chronic kidney disease: Secondary | ICD-10-CM | POA: Diagnosis not present

## 2014-08-12 DIAGNOSIS — E119 Type 2 diabetes mellitus without complications: Secondary | ICD-10-CM | POA: Insufficient documentation

## 2014-08-12 DIAGNOSIS — Z09 Encounter for follow-up examination after completed treatment for conditions other than malignant neoplasm: Secondary | ICD-10-CM | POA: Insufficient documentation

## 2014-08-12 DIAGNOSIS — R188 Other ascites: Secondary | ICD-10-CM | POA: Diagnosis not present

## 2014-08-12 LAB — COMPREHENSIVE METABOLIC PANEL
ALT: 11 U/L — ABNORMAL LOW (ref 14–54)
AST: 24 U/L (ref 15–41)
Albumin: 3.1 g/dL — ABNORMAL LOW (ref 3.5–5.0)
Alkaline Phosphatase: 47 U/L (ref 38–126)
Anion gap: 13 (ref 5–15)
BUN: 8 mg/dL (ref 6–20)
CO2: 24 mmol/L (ref 22–32)
Calcium: 8.6 mg/dL — ABNORMAL LOW (ref 8.9–10.3)
Chloride: 97 mmol/L — ABNORMAL LOW (ref 101–111)
Creatinine, Ser: 0.66 mg/dL (ref 0.44–1.00)
GFR calc Af Amer: >60 mL/min (ref >60)
GFR calc non Af Amer: >60 mL/min (ref >60)
Glucose, Bld: 114 mg/dL — ABNORMAL HIGH (ref 65–99)
Potassium: 3.6 mmol/L (ref 3.5–5.1)
Sodium: 134 mmol/L — ABNORMAL LOW (ref 135–145)
Total Bilirubin: 1 mg/dL (ref 0.3–1.2)
Total Protein: 8.5 g/dL — ABNORMAL HIGH (ref 6.5–8.1)

## 2014-08-12 LAB — URINALYSIS COMPLETE WITH MICROSCOPIC (ARMC ONLY)
Bacteria, UA: NONE SEEN
Bilirubin Urine: NEGATIVE
Glucose, UA: NEGATIVE mg/dL
Hgb urine dipstick: NEGATIVE
Ketones, ur: NEGATIVE mg/dL
Leukocytes, UA: NEGATIVE
Nitrite: NEGATIVE
Protein, ur: NEGATIVE mg/dL
Specific Gravity, Urine: >1.06 — ABNORMAL HIGH (ref 1.005–1.030)
WBC, UA: NONE SEEN WBC/hpf (ref 0–5)
pH: 6 (ref 5.0–8.0)

## 2014-08-12 LAB — CBC
HCT: 34.1 % — ABNORMAL LOW (ref 35.0–47.0)
Hemoglobin: 11.4 g/dL — ABNORMAL LOW (ref 12.0–16.0)
MCH: 30.5 pg (ref 26.0–34.0)
MCHC: 33.3 g/dL (ref 32.0–36.0)
MCV: 91.5 fL (ref 80.0–100.0)
Platelets: 278 10*3/uL (ref 150–440)
RBC: 3.72 MIL/uL — ABNORMAL LOW (ref 3.80–5.20)
RDW: 14.5 % (ref 11.5–14.5)
WBC: 9.2 10*3/uL (ref 3.6–11.0)

## 2014-08-12 LAB — LIPASE, BLOOD: Lipase: 19 U/L — ABNORMAL LOW (ref 22–51)

## 2014-08-12 MED ORDER — ONDANSETRON HCL 4 MG/2ML IJ SOLN
4.0000 mg | Freq: Once | INTRAMUSCULAR | Status: AC
  Administered 2014-08-12: 4 mg via INTRAVENOUS

## 2014-08-12 MED ORDER — HYDROMORPHONE HCL 1 MG/ML IJ SOLN
0.5000 mg | INTRAMUSCULAR | Status: AC
  Administered 2014-08-12: 0.5 mg via INTRAVENOUS
  Filled 2014-08-12: qty 1

## 2014-08-12 MED ORDER — IOHEXOL 300 MG/ML  SOLN
100.0000 mL | Freq: Once | INTRAMUSCULAR | Status: AC | PRN
  Administered 2014-08-12: 125 mL via INTRAVENOUS

## 2014-08-12 MED ORDER — ONDANSETRON HCL 4 MG/2ML IJ SOLN
INTRAMUSCULAR | Status: AC
  Filled 2014-08-12: qty 2

## 2014-08-12 MED ORDER — SODIUM CHLORIDE 0.9 % IV BOLUS (SEPSIS)
1000.0000 mL | Freq: Once | INTRAVENOUS | Status: AC
  Administered 2014-08-12: 1000 mL via INTRAVENOUS

## 2014-08-12 MED ORDER — OXYCODONE-ACETAMINOPHEN 5-325 MG PO TABS: 1.0000 | ORAL_TABLET | Freq: Four times a day (QID) | ORAL | Status: DC | PRN

## 2014-08-12 NOTE — ED Notes (Signed)
Pt discharged home after verbalizing understanding of discharge instructions; nad noted. 

## 2014-08-12 NOTE — Telephone Encounter (Signed)
Returned patient call. Patient reports irritation and drainage coming from the incision the radiologist made when her abscess was drained. Patient continues to have generalized pain in her abdomin. Directed patient to go to the ED if her pain increases without resolution or develops a high fever otherwise she should come to her appointment tomorrow 08/13/14 at 2:45 in the [place] office. Patent confirmed understanding of directions and appointment day and time.

## 2014-08-12 NOTE — ED Provider Notes (Signed)
----------------------------------------- 6:41 PM on 08/12/2014 -----------------------------------------  [facility] Emergency Department Provider Note  ____________________________________________  Time seen: Approximately 6:41 PM  I have reviewed the triage vital signs and the nursing notes.   HISTORY  Chief Complaint Wound Infection and Post-op Problem    HPI Lori Mckenzie is a 49 y.o. female history diabetes, COPD and also notable recent abscess in the right upper quadrant along with cholecystitis and ESCP.  Patient notes that she did having ongoing drainage from around the site of her percutaneous drain's for the last few days and ongoing tenderness in the right upper abdomen. She reports she is currently on antibiotic's not have any fevers nausea or vomiting. No trouble breathing or shortness of breath. No chest pain. No trouble urinating.  She had a CT scan today as an outpatient and was called by the clinic to return to the emergency room if she is experiencing any concerns. She reports concern of ongoing drainage from around the site of her drain. Reports she gets some pus-looking fluid occasionally draining in that area. No skin redness or tenderness. She does have some pain in the right upper abdomen which she's had since time of surgery.   Past Medical History  Diagnosis Date  . Brittle bone disease   . Asthma   . Thyroid disease   . Hypertension   . Back pain   . Diabetes mellitus without complication   . Chronic kidney disease   . Anxiety   . GERD (gastroesophageal reflux disease)   . Sleep apnea   . COPD (chronic obstructive pulmonary disease)   . Hypothyroidism   . Cervical disc disease   . TIA (transient ischemic attack)   . Cancer     Uteriine  ca 82yr ago partial hysterectomy  . Collagen vascular disease     RA  3-4 yrs ago    Patient Active Problem List   Diagnosis Date Noted  . Dysthymia 08/05/2014  . Other social  stressor 08/05/2014  . Abscess of abdominal cavity 08/03/2014  . Bile leak, postoperative 07/17/2014  . Steatohepatitis 07/15/2014  . Cholecystitis   . Biliary colic 001/75/1025 . Abdominal pain   . Type 2 diabetes mellitus 07/12/2014  . COPD (chronic obstructive pulmonary disease) 07/12/2014  . HTN (hypertension) 07/12/2014  . GERD (gastroesophageal reflux disease) 07/12/2014  . OSA on CPAP 07/12/2014  . Anxiety 07/12/2014    Past Surgical History  Procedure Laterality Date  . Abdominal hysterectomy    . Tubal ligation    . Colonoscopy with propofol N/A 06/23/2014    Procedure: COLONOSCOPY WITH PROPOFOL;  Surgeon: MLollie Sails MD;  [facility];  Service: Endoscopy;  Laterality: N/A;  . Esophagogastroduodenoscopy N/A 06/23/2014    Procedure: ESOPHAGOGASTRODUODENOSCOPY (EGD);  Surgeon: MLollie Sails MD;  [facility];  Service: Endoscopy;  Laterality: N/A;  . Cholecystectomy N/A 07/15/2014    Procedure: LAPAROSCOPIC CHOLECYSTECTOMY with liver biopsy ;  Surgeon: MSherri Rad MD;  Location: [facility] ORS;  Service: General;  Laterality: N/A;  . Ercp N/A 07/16/2014    Procedure: ENDOSCOPIC RETROGRADE CHOLANGIOPANCREATOGRAPHY (ERCP);  Surgeon: MClarene Essex MD;  [facility];  Service: Endoscopy;  Laterality: N/A;    Current Outpatient Rx  Name  Route  Sig  Dispense  Refill  . albuterol (PROVENTIL HFA;VENTOLIN HFA) 108 (90 BASE) MCG/ACT inhaler   Inhalation   Inhale 2 puffs into the lungs every 4 (four) hours as needed for wheezing or shortness of breath.         .Marland Kitchen  ALPRAZolam (XANAX) 1 MG tablet   Oral   Take 1 tablet (1 mg total) by mouth 3 (three) times daily as needed for anxiety.   30 tablet   0   . alprazolam (XANAX) 2 MG tablet   Oral   Take 2 mg by mouth 3 (three) times daily as needed for anxiety.          Marland Kitchen amoxicillin-clavulanate (AUGMENTIN) 875-125 MG per tablet   Oral   Take 1 tablet by mouth 2 (two) times daily.   10  tablet   0   . amoxicillin-clavulanate (AUGMENTIN) 875-125 MG per tablet   Oral   Take 1 tablet by mouth 2 (two) times daily.   14 tablet   0   . citalopram (CELEXA) 20 MG tablet   Oral   Take 20 mg by mouth daily.         . clotrimazole (MYCELEX) 10 MG troche   Oral   Take 10 mg by mouth 4 (four) times daily as needed (mouth dryness).         . fluticasone (FLONASE) 50 MCG/ACT nasal spray   Each Nare   Place 1 spray into both nostrils daily as needed (dryness).          . Fluticasone-Salmeterol (ADVAIR) 100-50 MCG/DOSE AEPB   Inhalation   Inhale 1 puff into the lungs 2 (two) times daily.         . furosemide (LASIX) 40 MG tablet   Oral   Take 80 mg by mouth 2 (two) times daily.          Marland Kitchen gabapentin (NEURONTIN) 100 MG capsule   Oral   Take 300 mg by mouth 4 (four) times daily as needed (pain).         . hydrochlorothiazide (HYDRODIURIL) 25 MG tablet   Oral   Take 12.5 mg by mouth daily as needed (id diastolic blood pressure >64).          Marland Kitchen ibuprofen (ADVIL,MOTRIN) 800 MG tablet   Oral   Take 800 mg by mouth every 8 (eight) hours as needed for moderate pain.          Marland Kitchen ipratropium (ATROVENT HFA) 17 MCG/ACT inhaler   Inhalation   Inhale 2 puffs into the lungs every 8 (eight) hours.         . metFORMIN (GLUCOPHAGE) 1000 MG tablet   Oral   Take 500-1,000 mg by mouth 2 (two) times daily. Dose range based on blood sugar         . methocarbamol (ROBAXIN) 500 MG tablet   Oral   Take 500 mg by mouth 2 (two) times daily as needed for muscle spasms.          Marland Kitchen omeprazole (PRILOSEC) 20 MG capsule   Oral   Take 40 mg by mouth 2 (two) times daily as needed (acid reflux).         Marland Kitchen oxyCODONE-acetaminophen (PERCOCET/ROXICET) 5-325 MG per tablet   Oral   Take 1 tablet by mouth every 6 (six) hours as needed for severe pain.   30 tablet   0   . oxyCODONE-acetaminophen (PERCOCET/ROXICET) 5-325 MG per tablet   Oral   Take 1-2 tablets by mouth  every 6 (six) hours as needed for severe pain.   50 tablet   0   . OXYGEN   Inhalation   Inhale 1.5-2 L into the lungs continuous.          . rizatriptan (  MAXALT) 10 MG tablet   Oral   Take 10 mg by mouth as needed for migraine. May repeat in 2 hours if needed           Allergies Hydrocodone and Vicodin  Family History  Problem Relation Age of Onset  . Lung cancer Father   . Ulcers Father   . Heart disease Sister   . Ulcers Sister   . Heart disease Brother     Social History History  Substance Use Topics  . Smoking status: Never Smoker   . Smokeless tobacco: Not on file  . Alcohol Use: No     Comment: occ    Review of Systems Constitutional: No fever/chills Eyes: No visual changes. ENT: No sore throat. Cardiovascular: Denies chest pain. Respiratory: Denies shortness of breath. Gastrointestinal: See history of present illness  No diarrhea.  No constipation. Genitourinary: Negative for dysuria. Musculoskeletal: Negative for back pain. Skin: Negative for rash. Neurological: Negative for headaches, focal weakness or numbness.  10-point ROS otherwise negative.  ____________________________________________   PHYSICAL EXAM:  VITAL SIGNS: ED Triage Vitals  Enc Vitals Group     BP 08/12/14 1620 104/47 mmHg     Pulse Rate 08/12/14 1620 104     Resp --      Temp 08/12/14 1620 98.1 F (36.7 C)     Temp Source 08/12/14 1620 Oral     SpO2 08/12/14 1620 96 %     Weight 08/12/14 1620 240 lb (108.863 kg)     Height 08/12/14 1620 5' 2"  (1.575 m)     Head Cir --      Peak Flow --      Pain Score 08/12/14 1621 10     Pain Loc --      Pain Edu? --      Excl. in [place]? --     Constitutional: Alert and oriented. Chronically ill-appearing appearing and in no acute distress. Eyes: Conjunctivae are normal. PERRL. EOMI. Head: Atraumatic. Nose: No congestion/rhinnorhea. Mouth/Throat: Mucous membranes are moist.  Oropharynx non-erythematous. Neck: No stridor.    Cardiovascular: Normal rate, regular rhythm. Grossly normal heart sounds.  Good peripheral circulation. Respiratory: Normal respiratory effort.  No retractions. Lungs CTAB. Gastrointestinal: Soft and nontender except in the right upper quadrant where she has some mild tenderness to percussion around the area of her percutaneous drain with some slight amount of purulent drainage on bandage she is moderately tender in the right upper abdomen. No distention.No CVA tenderness. Musculoskeletal: No lower extremity tenderness.  No joint effusions. Neurologic:  Normal speech and language. No gross focal neurologic deficits are appreciated.  Skin:  Skin is warm, dry and intact. No rash noted. Psychiatric: Mood and affect are normal. Speech and behavior are normal.  ____________________________________________   LABS (all labs ordered are listed, but only abnormal results are displayed)  Labs Reviewed  LIPASE, BLOOD - Abnormal; Notable for the following:    Lipase 19 (*)    All other components within normal limits  COMPREHENSIVE METABOLIC PANEL - Abnormal; Notable for the following:    Sodium 134 (*)    Chloride 97 (*)    Glucose, Bld 114 (*)    Calcium 8.6 (*)    Total Protein 8.5 (*)    Albumin 3.1 (*)    ALT 11 (*)    All other components within normal limits  CBC - Abnormal; Notable for the following:    RBC 3.72 (*)    Hemoglobin 11.4 (*)  HCT 34.1 (*)    All other components within normal limits  URINALYSIS COMPLETEWITH MICROSCOPIC ([facility] ONLY) - Abnormal; Notable for the following:    Color, Urine YELLOW (*)    Specific Gravity, Urine >1.060 (*)    Squamous Epithelial / LPF 6-30 (*)    All other components within normal limits   ____________________________________________  EKG   ____________________________________________  RADIOLOGY   ____________________________________________   PROCEDURES  Procedure(s) performed: None  Critical Care performed:  No  ____________________________________________   INITIAL IMPRESSION / ASSESSMENT AND PLAN / ED COURSE  Pertinent labs & imaging results that were available during my care of the patient were reviewed by me and considered in my medical decision making (see chart for details).  Patient presents with ongoing right upper quadrant pain with slight discharge and mild purulent drainage from around her percutaneous site. She is hemodynamically stable. Her CT scan from earlier today is reviewed, and I have discussed with Dr. Pete Glatter of general surgery who will see that surgery sees the patient in consultation. Her labs are reviewed, no elevated white count, and she is afebrile. I will give her some IV fluid here, and we will wait consultation from surgery.  Based on her symptomatology and recent surgical course, I anticipate disposition based on recommendations from our surgical consultants.  ----------------------------------------- 8:29 PM on 08/12/2014 -----------------------------------------  Patient seen in consultation by Dr. Rexene Edison. The surgery recommends and feels patient is stable for discharge, and I would agree. She has follow-up outpatient tomorrow already set up with Dr. Felton Clinton. Return precautions advised. ____________________________________________   FINAL CLINICAL IMPRESSION(S) / ED DIAGNOSES  Final diagnoses:  Abscess of abdominal cavity      Delman Kitten, MD 08/12/14 2030

## 2014-08-12 NOTE — Telephone Encounter (Signed)
Patient has appointment tomorrow but has odor coming from sight, red streaks about a 1/4 inch, pain. She would also like to know CT results.

## 2014-08-12 NOTE — ED Notes (Signed)
Had gallbladder surgery July 9 with injury to the bowel. Began having foul drainage on Sunday. Increased drainage over the last few days.

## 2014-08-12 NOTE — Discharge Instructions (Signed)
Please follow-up with Dr. Felton Clinton tomorrow as scheduled. Use prescription medications only as provided, never more than prescribed.  Follow-up tomorrow. Return to the emergency room if you experience severe pain, fever, vomiting, feel weak, dehydrated or other new concerns or symptoms arise.  Percutaneous Abscess Drain An abscess is a collection of infected fluid inside the body. Your health care provider may decide to remove or drain the infected fluid from the area by placing a thin needle into the abscess. Usually, a small tube is left in place to drain the abscess fluid. The abscess fluid may take a few days to drain. LET [facility] CARE PROVIDER KNOW ABOUT:  Any allergies you have.  All medicines you are taking, including vitamins, herbs, eye drops, creams, and over-the-counter medicines. This includes steroid medicines by mouth or cream.  Previous problems you or members of your family have had with the use of anesthetics.  Any blood disorders you have.  Previous surgeries you have had.  Possibility of pregnancy, if this applies.  Medical conditions you have.  Any history of smoking. RISKS AND COMPLICATIONS Generally, this is a safe procedure. However, problems can occur and include:   Infection.  Allergic reaction to materials used (such as contrast dye).  Damage to a nearby organ or tissue.  Bleeding.  Blockage of a tube placed to drain the abscess, requiring placement of a new drainage tube.  A need to repeat the procedure.  Failure of the procedure to adequately drain the abscess, requiring an open surgical procedure to do so. BEFORE THE PROCEDURE   Ask your health care provider about:  Changing or stopping your regular medicines. This is especially important if you are taking diabetes medicines or blood thinners.  Taking medicines such as aspirin and ibuprofen. These medicines can thin your blood. Do not take these medicines before your procedure if your health  care provider asks you not to.  Your health care provider may do some blood or urine tests. These will help your health care provider learn how well your kidneys and liver are working and how well your blood clots.  Do not eat or drink anything after midnight on the night before the procedure or as directed by your health care provider.  Make arrangements for someone to drive you home after the procedure.  PROCEDURE   An IV tube will be placed in your arm. Medicine will be able to flow directly into your body through this tube.  You will lie on an X-ray table.  Your heart rate, blood pressure, and breathing will be monitored.   Your oxygen level will also be watched during the procedure. Supplemental oxygen may be given if necessary.  The skin around the area where the drainage tube (catheter) will be placed will be cleaned and numbed.  A small cut (incision) will then be made to insert the drainage tube. The drainage tube will be inserted using X-ray or CT scan to help direct where it should be placed.  The drainage tube will be guided into the abscess to drain the infected fluid.  The drainage tube may stay in place and be connected to a bag outside your body. It will stay until the fluid has stopped draining and the infection is gone. AFTER THE PROCEDURE  You will be taken to a recovery area where you will stay until the medicines have worn off.  You will stay in bed for several hours.  Your progress will be monitored.   Your blood  pressure and pulse will be checked often.   The area of the incision will be checked often.  You may have some pain or feel sick. Tell your health care provider.  As you begin to feel better, you may be given ice, fluids, and food.   When you can walk, drink, eat, and use the bathroom, you may be able to go home. Document Released: 05/06/2013 Document Reviewed: 02/08/2013 [facility] Patient Information 2015 [place]. This  information is not intended to replace advice given to you by your health care provider. Make sure you discuss any questions you have with your health care provider.

## 2014-08-12 NOTE — H&P (Signed)
CC: Worsening abdominal pain, drainage from perc drain site  HPI: 49 yo with a history of cholecystectomy complicated by postop bile leakage s/p ERCP s/p perc drain of biloma who presents with worsening RUQ pain.  She says that she has only been taking 1 percocet at a time as instructed but was taking 2 in the hospital with good pain control.  Also reports that she had some foul smelling material on her dressing and said that it became red around drain site.  Also reports discomfort with standing.  No fevers/chills, night sweats, shortness of breath, cough, chest pain.  Does report nausea with regular diet but improvement with soft diet. Also reports loose stools.  No dysuria/hematuria.  Active Ambulatory Problems    Diagnosis Date Noted  . Type 2 diabetes mellitus 07/12/2014  . COPD (chronic obstructive pulmonary disease) 07/12/2014  . HTN (hypertension) 07/12/2014  . GERD (gastroesophageal reflux disease) 07/12/2014  . OSA on CPAP 07/12/2014  . Anxiety 07/12/2014  . Abdominal pain   . Biliary colic 91/47/8295  . Cholecystitis   . Steatohepatitis 07/15/2014  . Bile leak, postoperative 07/17/2014  . Abscess of abdominal cavity 08/03/2014  . Dysthymia 08/05/2014  . Other social stressor 08/05/2014   Resolved Ambulatory Problems    Diagnosis Date Noted  . Choledocholithiasis 07/12/2014   Past Medical History  Diagnosis Date  . Brittle bone disease   . Asthma   . Thyroid disease   . Hypertension   . Back pain   . Diabetes mellitus without complication   . Chronic kidney disease   . Sleep apnea   . Hypothyroidism   . Cervical disc disease   . TIA (transient ischemic attack)   . Cancer   . Collagen vascular disease    Past Surgical History  Procedure Laterality Date  . Abdominal hysterectomy    . Tubal ligation    . Colonoscopy with propofol N/A 06/23/2014    Procedure: COLONOSCOPY WITH PROPOFOL;  Surgeon: Lollie Sails, MD;  Location: [facility] ENDOSCOPY;  Service: Endoscopy;   Laterality: N/A;  . Esophagogastroduodenoscopy N/A 06/23/2014    Procedure: ESOPHAGOGASTRODUODENOSCOPY (EGD);  Surgeon: Lollie Sails, MD;  Location: [facility] ENDOSCOPY;  Service: Endoscopy;  Laterality: N/A;  . Cholecystectomy N/A 07/15/2014    Procedure: LAPAROSCOPIC CHOLECYSTECTOMY with liver biopsy ;  Surgeon: Sherri Rad, MD;  Location: [facility] ORS;  Service: General;  Laterality: N/A;  . Ercp N/A 07/16/2014    Procedure: ENDOSCOPIC RETROGRADE CHOLANGIOPANCREATOGRAPHY (ERCP);  Surgeon: Clarene Essex, MD;  Location: [facility] ENDOSCOPY;  Service: Endoscopy;  Laterality: N/A;     Medication List    TAKE these medications        albuterol 108 (90 BASE) MCG/ACT inhaler  Commonly known as:  PROVENTIL HFA;VENTOLIN HFA  Inhale 2 puffs into the lungs every 4 (four) hours as needed for wheezing or shortness of breath.     ALPRAZolam 1 MG tablet  Commonly known as:  XANAX  Take 1 tablet (1 mg total) by mouth 3 (three) times daily as needed for anxiety.     amoxicillin-clavulanate 875-125 MG per tablet  Commonly known as:  AUGMENTIN  Take 1 tablet by mouth 2 (two) times daily.     citalopram 20 MG tablet  Commonly known as:  CELEXA  Take 20 mg by mouth daily.     clotrimazole 10 MG troche  Commonly known as:  MYCELEX  Take 10 mg by mouth 4 (four) times daily as needed (mouth dryness).     fluticasone  50 MCG/ACT nasal spray  Commonly known as:  FLONASE  Place 1 spray into both nostrils daily as needed (dryness).     Fluticasone-Salmeterol 100-50 MCG/DOSE Aepb  Commonly known as:  ADVAIR  Inhale 1 puff into the lungs 2 (two) times daily.     furosemide 40 MG tablet  Commonly known as:  LASIX  Take 80 mg by mouth 2 (two) times daily.     gabapentin 100 MG capsule  Commonly known as:  NEURONTIN  Take 300 mg by mouth 4 (four) times daily as needed (pain).     hydrochlorothiazide 25 MG tablet  Commonly known as:  HYDRODIURIL  Take 12.5 mg by mouth daily as needed (id diastolic blood pressure  >51).     ibuprofen 800 MG tablet  Commonly known as:  ADVIL,MOTRIN  Take 800 mg by mouth every 8 (eight) hours as needed for moderate pain.     ipratropium 17 MCG/ACT inhaler  Commonly known as:  ATROVENT HFA  Inhale 2 puffs into the lungs every 8 (eight) hours.     metFORMIN 1000 MG tablet  Commonly known as:  GLUCOPHAGE  Take 500-1,000 mg by mouth 2 (two) times daily. Dose range based on blood sugar     omeprazole 20 MG capsule  Commonly known as:  PRILOSEC  Take 40 mg by mouth 2 (two) times daily as needed (acid reflux).     oxyCODONE-acetaminophen 5-325 MG per tablet  Commonly known as:  PERCOCET/ROXICET  Take 1-2 tablets by mouth every 6 (six) hours as needed for severe pain.     OXYGEN  Inhale 1.5-2 L into the lungs continuous.     rizatriptan 10 MG tablet  Commonly known as:  MAXALT  Take 10 mg by mouth as needed for migraine. May repeat in 2 hours if needed     ROBAXIN 500 MG tablet  Generic drug:  methocarbamol  Take 500 mg by mouth 2 (two) times daily as needed for muscle spasms.       History   Social History  . Marital Status: Married    Spouse Name: N/A  . Number of Children: N/A  . Years of Education: N/A   Occupational History  . Not on file.   Social History Main Topics  . Smoking status: Never Smoker   . Smokeless tobacco: Not on file  . Alcohol Use: No     Comment: occ  . Drug Use: No  . Sexual Activity: Not on file   Other Topics Concern  . Not on file   Social History Narrative   Family History  Problem Relation Age of Onset  . Lung cancer Father   . Ulcers Father   . Heart disease Sister   . Ulcers Sister   . Heart disease Brother    Allergies  Allergen Reactions  . Hydrocodone Itching    Other reaction(s): ITCHING  . Vicodin [Hydrocodone-Acetaminophen] Rash   ROS: Full ROS obtained, pertinent positives and negatives as above.  Blood pressure 101/70, pulse 85, temperature 98.1 F (36.7 C), temperature source Oral, resp.  rate 21, height 5' 2"  (1.575 m), weight 108.863 kg (240 lb), SpO2 98 %. GEN: NAD/A&Ox3 FACE: no obvious facial trauma, normal external nose, normal external ears EYES: no scleral icterus, no conjunctivitis HEAD: normocephalic atraumatic CV: RRR, no MRG RESP: moving air well, lungs clear ABD: soft, min tender, nondistended, JP site with no erythema, bloody purulent fluid in JP EXT: moving all ext well, strength 5/5 NEURO: cnII-XII  grossly intact, sensation intact all 4 ext  Labs: Personally reviewed, significant for: WBC 9.2 LFT: Nl Cr .66  Imaging: personally reviewed Residual abscess/biloma with perc drain Small amount of fluid outside liver Small amount of pelvic fluid  A/P 49 yo who presents with pain, concerns about drainage from Drain site.  As perc drain goes to residual abscess and there is perihepatic fluid which is likely infected too, it is likely that this is draining around tube, which actually may be a good thing.  Good pain control with percocet, which she has been taking sparingly, so I have given refill for percocet.  I have offered patient admission for pain control, as I see no significant findings on labs or imaging, but she would like to go home.  I have also placed an abdominal binder on patient for her concerns of pulling when standing, which she says she feels much relief from. Will f/u at normal appt with Dr. Marina Gravel.

## 2014-08-12 NOTE — Telephone Encounter (Signed)
Patients son called and said he was going to take [name] to the Emergency Department. The odor was awful. I let Annie Main know.

## 2014-08-13 ENCOUNTER — Encounter: Payer: Self-pay | Admitting: Surgery

## 2014-08-13 ENCOUNTER — Ambulatory Visit (INDEPENDENT_AMBULATORY_CARE_PROVIDER_SITE_OTHER): Payer: Medicaid Other | Admitting: Surgery

## 2014-08-13 ENCOUNTER — Telehealth: Payer: Self-pay

## 2014-08-13 VITALS — BP 114/79 | HR 87 | Temp 98.3°F | Ht 64.0 in | Wt 235.0 lb

## 2014-08-13 DIAGNOSIS — K651 Peritoneal abscess: Secondary | ICD-10-CM

## 2014-08-13 DIAGNOSIS — K838 Other specified diseases of biliary tract: Secondary | ICD-10-CM

## 2014-08-13 DIAGNOSIS — K929 Disease of digestive system, unspecified: Secondary | ICD-10-CM

## 2014-08-13 DIAGNOSIS — K9189 Other postprocedural complications and disorders of digestive system: Secondary | ICD-10-CM

## 2014-08-13 NOTE — Telephone Encounter (Signed)
Called [facility] at [phone] and spoke to [facility] and asked if there was a possibility for their nurse to go once a day to drain her catheter. Davita stated that she would tell the nurse and if that was not possible, then they would teach a family member to do it every other day. Davita stated that the nurse is scheduled to go three times a week. Davita also stated that she would tell the nurse to take supplies to the patient's house.

## 2014-08-13 NOTE — Progress Notes (Signed)
Surgery clinic.   49 year old female who's had a complicated course following a laparoscopic cholecystectomy. She had a postoperative cystic duct leak requiring ERCP and stenting. She then developed a gallbladder fossa abscess which was drained percutaneously last Monday. She has a prolonged hospitalization with pain issues being the main issue. She was discharged home last week with the percutaneous drain in place. She returned to the emergency room last evening with increasing pain. CT scan  Repeated on the ninth demonstrates a diminishing fluid collection with some ascites which appears to be nonenhancing and stable in appearance.   The day she is doing well. There is been no fevers. Her white count was normal.   On exam she is alert and oriented she is anicteric.  her abdomen is soft and nontender. There is some minimal tube site irritation in the right upper quadrant. There is bloody fluid in the bag. There has been no bile since placement.   impression right upper quadrant abscess following  Cholecystectomy complicated by biloma and bile leak.   she has one more day of oral antibiotics.   She is not requesting any more pain medications as a prescription was provided last night by one of my associates.   I will continue the drain for one more week. I'll have her follow-up in the office with Dr. Pat Patrick.

## 2014-08-13 NOTE — Patient Instructions (Signed)
We will contact the agency that goes to your house and let them know that it will need to be flushed. If you have any questions or concerns, please give [place] a call. We will see you in 10 days.

## 2014-08-14 ENCOUNTER — Telehealth: Payer: Self-pay

## 2014-08-14 ENCOUNTER — Telehealth: Payer: Self-pay | Admitting: Surgery

## 2014-08-14 NOTE — Telephone Encounter (Signed)
Called Lori Mckenzie at this time [phone] and orders given to flush JP daily with 10cc NS. She is also to teach patient's family how to flush JP on days that nurse is not able to go out to patient's home and to keep an accurate record of output without including flush amount. Apply drain sponge or 4x4 and tape around JP daily.  Read back of orders was completed during phone call.  Nurse encouraged to call back with any other questions or concerns.

## 2014-08-14 NOTE — Telephone Encounter (Signed)
Called [facility] at this time and spoke with Mickel Baas. She states that Lori Mckenzie is on the phone and that she would talk with her and get the verbal orders that were given yesterday to [facility], the patient's Home Health Nurse. She will call back with any further questions regarding this matter.

## 2014-08-14 NOTE — Telephone Encounter (Signed)
Please call Dawn, home health nurse for patient. She states patient was in our office yesterday and said some orders were supposed to be sent over for a JP site and drainage and she would like to speak with the nurse about this because they haven't received any orders at this time. Thanks

## 2014-08-18 ENCOUNTER — Telehealth: Payer: Self-pay | Admitting: Surgery

## 2014-08-18 ENCOUNTER — Other Ambulatory Visit: Payer: Self-pay | Admitting: Surgery

## 2014-08-18 NOTE — Telephone Encounter (Signed)
Please call patient she is having a lot of pain around her wound and in her abdomen and groin area. She states she was given an option by Dr Rexene Edison to come back into the hospital or try to deal with the pain. She has tried to deal with it but it has gotten worse. Please call and advise.

## 2014-08-18 NOTE — Telephone Encounter (Signed)
Appointment made to see Dr Rexene Edison in the [facility] office tomorrow 08/19/14 @ 11:30 am. Patient confirmed appointment time and location.

## 2014-08-18 NOTE — Telephone Encounter (Signed)
I made an appointment for her tomorrow but she wants to know what can she do for pain today.

## 2014-08-18 NOTE — Telephone Encounter (Signed)
Returned patient call and informed her that Dr Rexene Edison wants to assess her first in the office to determine her source of severe pain before prescribing any additional pain medication. Appointment time 11:30, date 08/19/14, and place [place] were reviewed with the patient. Patient confirmed appointment.

## 2014-08-19 ENCOUNTER — Ambulatory Visit
Admission: RE | Admit: 2014-08-19 | Discharge: 2014-08-19 | Disposition: A | Discharge status: Home / Self Care | Payer: Medicaid Other | Source: Ambulatory Visit | Attending: Surgery | Admitting: Surgery

## 2014-08-19 ENCOUNTER — Telehealth: Payer: Self-pay

## 2014-08-19 ENCOUNTER — Ambulatory Visit (INDEPENDENT_AMBULATORY_CARE_PROVIDER_SITE_OTHER): Payer: Medicaid Other | Admitting: Surgery

## 2014-08-19 ENCOUNTER — Encounter: Payer: Self-pay | Admitting: Surgery

## 2014-08-19 VITALS — BP 124/70 | HR 102 | Temp 98.5°F | Ht 64.0 in | Wt 235.0 lb

## 2014-08-19 DIAGNOSIS — R1084 Generalized abdominal pain: Secondary | ICD-10-CM

## 2014-08-19 MED ORDER — TECHNETIUM TC 99M MEBROFENIN IV KIT
4.9500 | PACK | Freq: Once | INTRAVENOUS | Status: DC | PRN
  Administered 2014-08-19: 4.95 via INTRAVENOUS
  Filled 2014-08-19: qty 6

## 2014-08-19 MED ORDER — OXYCODONE-ACETAMINOPHEN 5-325 MG PO TABS: 1.0000 | ORAL_TABLET | Freq: Four times a day (QID) | ORAL | Status: DC | PRN

## 2014-08-19 NOTE — Telephone Encounter (Signed)
Nuclear medicine called at this time and states that patient's HIDA is negative for an evidence of a bile leak.  Spoke with Dr. Rexene Edison at this time and he would like to refer to pain management doctor as well as keep on schedule for Dr. Marina Gravel to see her on 08/25/14.  Referral will been placed and patient should be hearing from pain management or our office regarding this appointment within 2 weeks.  Call made to patient. No answer. Left voicemail for return phone call.

## 2014-08-19 NOTE — Telephone Encounter (Signed)
Patient called back at this time. Explained all information below. She verbalized understanding of this.

## 2014-08-19 NOTE — Telephone Encounter (Signed)
Please submit referral to Pain Management for management of chronic abdominal pain.

## 2014-08-19 NOTE — Patient Instructions (Addendum)
You will need to have a HIDA scan. This is scheduled at the [facility] at 1pm today. You may not have anything to eat/drink/candy/ or narcotic pain medication between now and time of scan. We will call you with results and recommendations as soon as we have them.  We will speak with Dr. Marina Gravel about making you a referral to a pain management Doctor. We will call you when all these arrangements made.

## 2014-08-19 NOTE — Progress Notes (Signed)
Surgery Progress Note  S: C/o lower abdominal pain.  + BM.  Has been wearing binder with good support.  Otherwise doing well  Blood pressure 124/70, pulse 102, temperature 98.5 F (36.9 C), temperature source Oral, height 5' 4"  (1.626 m), weight 235 lb (106.595 kg). GEN: NAD/A&Ox3 ABD: soft, hyperesthetic to suprapubic region, nondistended  A/P 49 yo s/p lap chole s/p ERCP with stent s/p percutaneous drainage.  Still with suprapubic pain. Have recommended wearing binder lower as she is wearing it above her pannus.  I also noted from last CT that she had moderate free fluid in pelvis as well as other small fluid collections.  Questioning whether this could be due to bile from a persistent bile leak.  Will obtain HIDA to check this and may require adjustment of stent.  Will discuss plan with Dr. Marina Gravel.

## 2014-08-20 NOTE — Telephone Encounter (Signed)
I have sent a referral through [facility] to Pain Management for Chronic abdominal pain.   The [facility] will contact patient with an appointment.

## 2014-08-21 ENCOUNTER — Encounter: Payer: Self-pay | Admitting: Surgery

## 2014-08-21 ENCOUNTER — Ambulatory Visit (INDEPENDENT_AMBULATORY_CARE_PROVIDER_SITE_OTHER): Payer: Medicaid Other | Admitting: Surgery

## 2014-08-21 ENCOUNTER — Ambulatory Visit: Payer: Self-pay | Admitting: *Deleted

## 2014-08-21 VITALS — Temp 98.4°F

## 2014-08-21 VITALS — BP 130/77 | HR 105 | Temp 98.5°F

## 2014-08-21 DIAGNOSIS — Z09 Encounter for follow-up examination after completed treatment for conditions other than malignant neoplasm: Secondary | ICD-10-CM

## 2014-08-21 MED ORDER — AMOXICILLIN-POT CLAVULANATE 875-125 MG PO TABS: 1.0000 | ORAL_TABLET | Freq: Two times a day (BID) | ORAL | Status: DC

## 2014-08-21 NOTE — Addendum Note (Signed)
Addended by: Marlyce Huge A on: 08/21/2014 02:06 PM   Modules accepted: Orders

## 2014-08-21 NOTE — Patient Instructions (Signed)
Do not drive on pain medications Do not lift greater than 15 lbs for a period of 6 weeks Call or return to ER if you develop fever greater than 101.5, nausea/vomiting, increased pain, redness/drainage from incisions Take bandages off in 72 hours.  Okay to shower with bandages on or after they come off, no tub baths

## 2014-08-21 NOTE — Progress Notes (Addendum)
Progress Note  S: Here for removal of perc drain.  HIDA negative.  Still c/o suprapubic pain which began after transvaginal U/S which showed that she still had ovaries.  Abd pain improved.   O:Blood pressure 130/77, pulse 105, temperature 98.5 F (36.9 C), temperature source Oral. GEN: NAD/A&Ox3 ABD: soft, min tender, nondistended  A/P: 49 yo s/p lap chole with postop bile leak.  Perc drain removed. - f/u prn - referral to OB for suprapubic pain which began when she found out that she had ovaries - pain referral  Upon removal of drain, patient began leaking ascites.  Suture of opening performed  Informed consent obtained.  Wound prepped and draped.  Time out performed.  Wound infiltrated with 1% lidocaine.  3-0 nylon figure of eight suture placed.  No obvious drainage remaining.  Drapes taken down.  No immediate complications, needle, sponge and instrument count correct at end of procedure.  Patient returned later with some persistent leakage.  Ostomy bag applied without difficulty.  Began on augmentin to prevent against bacterial peritonitis

## 2014-08-22 ENCOUNTER — Telehealth: Payer: Self-pay | Admitting: Surgery

## 2014-08-22 NOTE — Telephone Encounter (Signed)
-----   Message from Madelaine Bhat, RN sent at 08/21/2014  3:24 PM EDT ----- Regarding: referral Please send a referral from Dr Rexene Edison to [facility] Dr. Laurey Morale for the patient.  Thank you

## 2014-08-22 NOTE — Telephone Encounter (Signed)
I have faxed a referral, clinic notes, labs and imaging to [facility] per the request of Dr Rexene Edison for Suprapubic pain to [phone]. This referral was for Dr. Laurey Morale. Once the referral has been reviewed the office staff will call the patient with an appointment date and time.

## 2014-08-25 ENCOUNTER — Ambulatory Visit (INDEPENDENT_AMBULATORY_CARE_PROVIDER_SITE_OTHER): Payer: Medicaid Other | Admitting: Surgery

## 2014-08-25 ENCOUNTER — Emergency Department: Payer: Medicaid Other

## 2014-08-25 ENCOUNTER — Encounter: Payer: Self-pay | Admitting: Emergency Medicine

## 2014-08-25 ENCOUNTER — Encounter: Payer: Self-pay | Admitting: Surgery

## 2014-08-25 ENCOUNTER — Telehealth: Payer: Self-pay | Admitting: Surgery

## 2014-08-25 ENCOUNTER — Inpatient Hospital Stay
Admission: EM | Admit: 2014-08-25 | Discharge: 2014-08-30 | DRG: 372 | Disposition: A | Discharge status: Home / Self Care | Payer: Medicaid Other | Attending: Surgery | Admitting: Surgery

## 2014-08-25 VITALS — BP 130/73 | HR 114 | Temp 98.3°F | Wt 244.0 lb

## 2014-08-25 DIAGNOSIS — R188 Other ascites: Secondary | ICD-10-CM | POA: Diagnosis present

## 2014-08-25 DIAGNOSIS — K219 Gastro-esophageal reflux disease without esophagitis: Secondary | ICD-10-CM | POA: Diagnosis present

## 2014-08-25 DIAGNOSIS — I129 Hypertensive chronic kidney disease with stage 1 through stage 4 chronic kidney disease, or unspecified chronic kidney disease: Secondary | ICD-10-CM | POA: Diagnosis present

## 2014-08-25 DIAGNOSIS — Z885 Allergy status to narcotic agent status: Secondary | ICD-10-CM

## 2014-08-25 DIAGNOSIS — Z8542 Personal history of malignant neoplasm of other parts of uterus: Secondary | ICD-10-CM

## 2014-08-25 DIAGNOSIS — J45909 Unspecified asthma, uncomplicated: Secondary | ICD-10-CM | POA: Diagnosis present

## 2014-08-25 DIAGNOSIS — Z8249 Family history of ischemic heart disease and other diseases of the circulatory system: Secondary | ICD-10-CM | POA: Diagnosis not present

## 2014-08-25 DIAGNOSIS — N189 Chronic kidney disease, unspecified: Secondary | ICD-10-CM | POA: Diagnosis present

## 2014-08-25 DIAGNOSIS — IMO0002 Reserved for concepts with insufficient information to code with codable children: Secondary | ICD-10-CM

## 2014-08-25 DIAGNOSIS — Z9049 Acquired absence of other specified parts of digestive tract: Secondary | ICD-10-CM | POA: Diagnosis present

## 2014-08-25 DIAGNOSIS — Z888 Allergy status to other drugs, medicaments and biological substances status: Secondary | ICD-10-CM

## 2014-08-25 DIAGNOSIS — M25511 Pain in right shoulder: Secondary | ICD-10-CM

## 2014-08-25 DIAGNOSIS — E039 Hypothyroidism, unspecified: Secondary | ICD-10-CM | POA: Diagnosis present

## 2014-08-25 DIAGNOSIS — K651 Peritoneal abscess: Secondary | ICD-10-CM | POA: Diagnosis present

## 2014-08-25 DIAGNOSIS — J449 Chronic obstructive pulmonary disease, unspecified: Secondary | ICD-10-CM | POA: Diagnosis present

## 2014-08-25 DIAGNOSIS — R1084 Generalized abdominal pain: Secondary | ICD-10-CM

## 2014-08-25 DIAGNOSIS — G4730 Sleep apnea, unspecified: Secondary | ICD-10-CM | POA: Diagnosis present

## 2014-08-25 DIAGNOSIS — K838 Other specified diseases of biliary tract: Secondary | ICD-10-CM

## 2014-08-25 DIAGNOSIS — Z90710 Acquired absence of both cervix and uterus: Secondary | ICD-10-CM

## 2014-08-25 DIAGNOSIS — Z9889 Other specified postprocedural states: Secondary | ICD-10-CM | POA: Diagnosis not present

## 2014-08-25 DIAGNOSIS — Z8673 Personal history of transient ischemic attack (TIA), and cerebral infarction without residual deficits: Secondary | ICD-10-CM | POA: Diagnosis not present

## 2014-08-25 DIAGNOSIS — Z9851 Tubal ligation status: Secondary | ICD-10-CM | POA: Diagnosis not present

## 2014-08-25 DIAGNOSIS — K9189 Other postprocedural complications and disorders of digestive system: Secondary | ICD-10-CM

## 2014-08-25 DIAGNOSIS — R509 Fever, unspecified: Secondary | ICD-10-CM

## 2014-08-25 DIAGNOSIS — Z801 Family history of malignant neoplasm of trachea, bronchus and lung: Secondary | ICD-10-CM

## 2014-08-25 DIAGNOSIS — T888XXD Other specified complications of surgical and medical care, not elsewhere classified, subsequent encounter: Secondary | ICD-10-CM

## 2014-08-25 DIAGNOSIS — R109 Unspecified abdominal pain: Secondary | ICD-10-CM

## 2014-08-25 DIAGNOSIS — K7581 Nonalcoholic steatohepatitis (NASH): Secondary | ICD-10-CM

## 2014-08-25 DIAGNOSIS — K929 Disease of digestive system, unspecified: Secondary | ICD-10-CM

## 2014-08-25 DIAGNOSIS — R062 Wheezing: Secondary | ICD-10-CM

## 2014-08-25 DIAGNOSIS — K819 Cholecystitis, unspecified: Secondary | ICD-10-CM

## 2014-08-25 HISTORY — DX: Reserved for concepts with insufficient information to code with codable children: IMO0002

## 2014-08-25 LAB — GRAM STAIN: Special Requests: NORMAL

## 2014-08-25 LAB — URINALYSIS COMPLETE WITH MICROSCOPIC (ARMC ONLY)
Bacteria, UA: NONE SEEN
Bilirubin Urine: NEGATIVE
Glucose, UA: NEGATIVE mg/dL
Hgb urine dipstick: NEGATIVE
Ketones, ur: NEGATIVE mg/dL
Nitrite: NEGATIVE
Protein, ur: NEGATIVE mg/dL
Specific Gravity, Urine: 1.032 — ABNORMAL HIGH (ref 1.005–1.030)
pH: 6 (ref 5.0–8.0)

## 2014-08-25 LAB — LIPASE, BLOOD: Lipase: 13 U/L — ABNORMAL LOW (ref 22–51)

## 2014-08-25 LAB — HEPATIC FUNCTION PANEL
ALT: 12 U/L — ABNORMAL LOW (ref 14–54)
AST: 25 U/L (ref 15–41)
Albumin: 2.8 g/dL — ABNORMAL LOW (ref 3.5–5.0)
Alkaline Phosphatase: 46 U/L (ref 38–126)
Bilirubin, Direct: 0.2 mg/dL (ref 0.1–0.5)
Indirect Bilirubin: 0.2 mg/dL — ABNORMAL LOW (ref 0.3–0.9)
Total Bilirubin: 0.4 mg/dL (ref 0.3–1.2)
Total Protein: 7.3 g/dL (ref 6.5–8.1)

## 2014-08-25 LAB — BASIC METABOLIC PANEL
Anion gap: 7 (ref 5–15)
BUN: 6 mg/dL (ref 6–20)
CO2: 29 mmol/L (ref 22–32)
Calcium: 8.3 mg/dL — ABNORMAL LOW (ref 8.9–10.3)
Chloride: 101 mmol/L (ref 101–111)
Creatinine, Ser: 0.5 mg/dL (ref 0.44–1.00)
GFR calc Af Amer: >60 mL/min (ref >60)
GFR calc non Af Amer: >60 mL/min (ref >60)
Glucose, Bld: 115 mg/dL — ABNORMAL HIGH (ref 65–99)
Potassium: 3.7 mmol/L (ref 3.5–5.1)
Sodium: 137 mmol/L (ref 135–145)

## 2014-08-25 LAB — BODY FLUID CELL COUNT WITH DIFFERENTIAL
Eos, Fluid: 0 %
Lymphs, Fluid: 19 %
Monocyte-Macrophage-Serous Fluid: 5 %
Neutrophil Count, Fluid: 76 %
Other Cells, Fluid: 0 %
Total Nucleated Cell Count, Fluid: 2637 cu mm

## 2014-08-25 LAB — CBC
HCT: 30.5 % — ABNORMAL LOW (ref 35.0–47.0)
Hemoglobin: 9.9 g/dL — ABNORMAL LOW (ref 12.0–16.0)
MCH: 29.3 pg (ref 26.0–34.0)
MCHC: 32.6 g/dL (ref 32.0–36.0)
MCV: 89.6 fL (ref 80.0–100.0)
Platelets: 248 10*3/uL (ref 150–440)
RBC: 3.4 MIL/uL — ABNORMAL LOW (ref 3.80–5.20)
RDW: 15.2 % — ABNORMAL HIGH (ref 11.5–14.5)
WBC: 6.1 10*3/uL (ref 3.6–11.0)

## 2014-08-25 LAB — BODY FLUID CULTURE: Special Requests: NORMAL

## 2014-08-25 LAB — TROPONIN I: Troponin I: <0.03 ng/mL (ref <0.031)

## 2014-08-25 MED ORDER — FLUTICASONE PROPIONATE 50 MCG/ACT NA SUSP
1.0000 | Freq: Every day | NASAL | Status: DC
  Administered 2014-08-25 – 2014-08-30 (×5): 1 via NASAL
  Filled 2014-08-25: qty 16

## 2014-08-25 MED ORDER — METHOCARBAMOL 500 MG PO TABS: 500.0000 mg | ORAL_TABLET | Freq: Three times a day (TID) | ORAL | Status: DC | PRN

## 2014-08-25 MED ORDER — FUROSEMIDE 40 MG PO TABS
40.0000 mg | ORAL_TABLET | Freq: Every day | ORAL | Status: DC
  Administered 2014-08-25 – 2014-08-30 (×5): 40 mg via ORAL
  Filled 2014-08-25 (×6): qty 1

## 2014-08-25 MED ORDER — ONDANSETRON HCL 4 MG/2ML IJ SOLN
4.0000 mg | Freq: Once | INTRAMUSCULAR | Status: AC
  Administered 2014-08-25: 4 mg via INTRAVENOUS

## 2014-08-25 MED ORDER — METFORMIN HCL 500 MG PO TABS
1000.0000 mg | ORAL_TABLET | Freq: Every day | ORAL | Status: DC
  Administered 2014-08-26 – 2014-08-28 (×3): 1000 mg via ORAL
  Filled 2014-08-25 (×4): qty 2

## 2014-08-25 MED ORDER — HEPARIN SODIUM (PORCINE) 5000 UNIT/ML IJ SOLN
5000.0000 [IU] | Freq: Three times a day (TID) | INTRAMUSCULAR | Status: DC
  Administered 2014-08-25 – 2014-08-30 (×14): 5000 [IU] via SUBCUTANEOUS
  Filled 2014-08-25 (×17): qty 1

## 2014-08-25 MED ORDER — VANCOMYCIN HCL IN DEXTROSE 1-5 GM/200ML-% IV SOLN
1000.0000 mg | Freq: Once | INTRAVENOUS | Status: AC
  Administered 2014-08-25: 1000 mg via INTRAVENOUS
  Filled 2014-08-25: qty 200

## 2014-08-25 MED ORDER — HYDROCHLOROTHIAZIDE 25 MG PO TABS
25.0000 mg | ORAL_TABLET | Freq: Every day | ORAL | Status: DC
  Administered 2014-08-26 – 2014-08-28 (×3): 25 mg via ORAL
  Filled 2014-08-25 (×6): qty 1

## 2014-08-25 MED ORDER — ASPIRIN EC 81 MG PO TBEC
81.0000 mg | DELAYED_RELEASE_TABLET | Freq: Every day | ORAL | Status: DC
  Administered 2014-08-25 – 2014-08-30 (×6): 81 mg via ORAL
  Filled 2014-08-25 (×6): qty 1

## 2014-08-25 MED ORDER — PIPERACILLIN-TAZOBACTAM 3.375 G IVPB
INTRAVENOUS | Status: AC
  Administered 2014-08-25: 3.375 g via INTRAVENOUS
  Filled 2014-08-25: qty 50

## 2014-08-25 MED ORDER — ALUM & MAG HYDROXIDE-SIMETH 200-200-20 MG/5ML PO SUSP
30.0000 mL | Freq: Four times a day (QID) | ORAL | Status: DC | PRN
  Filled 2014-08-25: qty 30

## 2014-08-25 MED ORDER — ONDANSETRON HCL 4 MG/2ML IJ SOLN
4.0000 mg | Freq: Four times a day (QID) | INTRAMUSCULAR | Status: DC | PRN
  Administered 2014-08-25 – 2014-08-28 (×3): 4 mg via INTRAVENOUS
  Filled 2014-08-25 (×3): qty 2

## 2014-08-25 MED ORDER — HYDROMORPHONE HCL 1 MG/ML IJ SOLN
INTRAMUSCULAR | Status: AC
  Administered 2014-08-25: 1 mg via INTRAVENOUS
  Filled 2014-08-25: qty 1

## 2014-08-25 MED ORDER — PIPERACILLIN-TAZOBACTAM 3.375 G IVPB
3.3750 g | Freq: Three times a day (TID) | INTRAVENOUS | Status: DC
Start: 2014-08-25 — End: 2014-08-30
  Administered 2014-08-25 – 2014-08-30 (×13): 3.375 g via INTRAVENOUS
  Filled 2014-08-25 (×20): qty 50

## 2014-08-25 MED ORDER — SODIUM CHLORIDE 0.9 % IJ SOLN: 3.0000 mL | INTRAMUSCULAR | Status: DC | PRN

## 2014-08-25 MED ORDER — CITALOPRAM HYDROBROMIDE 20 MG PO TABS
20.0000 mg | ORAL_TABLET | Freq: Every day | ORAL | Status: DC
  Administered 2014-08-25 – 2014-08-30 (×6): 20 mg via ORAL
  Filled 2014-08-25 (×6): qty 1

## 2014-08-25 MED ORDER — PANTOPRAZOLE SODIUM 40 MG PO TBEC
40.0000 mg | DELAYED_RELEASE_TABLET | Freq: Every day | ORAL | Status: DC
  Administered 2014-08-25 – 2014-08-30 (×6): 40 mg via ORAL
  Filled 2014-08-25 (×6): qty 1

## 2014-08-25 MED ORDER — ALPRAZOLAM 1 MG PO TABS
1.0000 mg | ORAL_TABLET | Freq: Three times a day (TID) | ORAL | Status: DC | PRN
  Administered 2014-08-27 – 2014-08-29 (×2): 1 mg via ORAL
  Filled 2014-08-25 (×2): qty 1

## 2014-08-25 MED ORDER — GABAPENTIN 300 MG PO CAPS
300.0000 mg | ORAL_CAPSULE | Freq: Two times a day (BID) | ORAL | Status: DC
  Administered 2014-08-25 – 2014-08-30 (×10): 300 mg via ORAL
  Filled 2014-08-25 (×10): qty 1

## 2014-08-25 MED ORDER — OXYCODONE-ACETAMINOPHEN 7.5-325 MG PO TABS
2.0000 | ORAL_TABLET | Freq: Four times a day (QID) | ORAL | Status: DC | PRN
  Administered 2014-08-26 – 2014-08-30 (×9): 2 via ORAL
  Filled 2014-08-25 (×10): qty 2

## 2014-08-25 MED ORDER — HYDROMORPHONE HCL 1 MG/ML IJ SOLN
1.0000 mg | INTRAMUSCULAR | Status: DC | PRN
  Administered 2014-08-25 – 2014-08-29 (×15): 1 mg via INTRAVENOUS
  Filled 2014-08-25 (×14): qty 1

## 2014-08-25 MED ORDER — HYDROMORPHONE HCL 1 MG/ML IJ SOLN
1.0000 mg | Freq: Once | INTRAMUSCULAR | Status: AC
  Administered 2014-08-25: 1 mg via INTRAVENOUS

## 2014-08-25 MED ORDER — VANCOMYCIN HCL IN DEXTROSE 1-5 GM/200ML-% IV SOLN
INTRAVENOUS | Status: AC
  Administered 2014-08-25: 1000 mg via INTRAVENOUS
  Filled 2014-08-25: qty 200

## 2014-08-25 MED ORDER — SODIUM CHLORIDE 0.9 % IV SOLN: 250.0000 mL | INTRAVENOUS | Status: DC | PRN

## 2014-08-25 MED ORDER — SODIUM CHLORIDE 0.9 % IJ SOLN
3.0000 mL | Freq: Two times a day (BID) | INTRAMUSCULAR | Status: DC
  Administered 2014-08-25 – 2014-08-29 (×9): 3 mL via INTRAVENOUS

## 2014-08-25 MED ORDER — ONDANSETRON HCL 4 MG PO TABS
4.0000 mg | ORAL_TABLET | Freq: Four times a day (QID) | ORAL | Status: DC | PRN
  Filled 2014-08-25 (×2): qty 1

## 2014-08-25 MED ORDER — SODIUM CHLORIDE 0.9 % IV BOLUS (SEPSIS)
1000.0000 mL | Freq: Once | INTRAVENOUS | Status: AC
  Administered 2014-08-25: 1000 mL via INTRAVENOUS

## 2014-08-25 MED ORDER — IOHEXOL 300 MG/ML  SOLN
125.0000 mL | Freq: Once | INTRAMUSCULAR | Status: AC | PRN
  Administered 2014-08-25: 125 mL via INTRAVENOUS
  Filled 2014-08-25: qty 125

## 2014-08-25 MED ORDER — ONDANSETRON HCL 4 MG/2ML IJ SOLN
INTRAMUSCULAR | Status: AC
  Administered 2014-08-25: 4 mg via INTRAVENOUS
  Filled 2014-08-25: qty 2

## 2014-08-25 MED ORDER — IOHEXOL 240 MG/ML SOLN
25.0000 mL | Freq: Once | INTRAMUSCULAR | Status: AC | PRN
  Administered 2014-08-25: 25 mL via ORAL
  Filled 2014-08-25: qty 25

## 2014-08-25 MED ORDER — LIDOCAINE-EPINEPHRINE (PF) 1 %-1:200000 IJ SOLN
INTRAMUSCULAR | Status: AC
  Administered 2014-08-25: 30 mL
  Filled 2014-08-25: qty 30

## 2014-08-25 MED ORDER — PIPERACILLIN-TAZOBACTAM 3.375 G IVPB
3.3750 g | Freq: Once | INTRAVENOUS | Status: AC
  Administered 2014-08-25: 3.375 g via INTRAVENOUS
  Filled 2014-08-25: qty 50

## 2014-08-25 NOTE — Telephone Encounter (Signed)
Son is upset because they are still sitting in the ED. Please call him

## 2014-08-25 NOTE — H&P (Signed)
Lori Mckenzie is an 49 y.o. female.    Chief Complaint: Abdominal pain  HPI: This a patient well-known to this service who had a biliary fistula and subsequent abdominal abscesses which were drained. She had a drain removed the other day and had drainage continue through the abdominal wall but that stopped now she thinks her abdomen is swelling that hepatic as he has performed a paracentesis and the results of that are currently pending. She denies nausea or vomiting but has been febrile at home but was not febrile and Dr. Algernon Huxley office today nor was she febrile here in the emergency room yet.  Also of note she states that she had some pain in her right shoulder when she had some lifting but she did not fall.  Past Medical History  Diagnosis Date  . Brittle bone disease   . Asthma   . Thyroid disease   . Hypertension   . Back pain   . Diabetes mellitus without complication   . Chronic kidney disease   . Anxiety   . GERD (gastroesophageal reflux disease)   . Sleep apnea   . COPD (chronic obstructive pulmonary disease)   . Hypothyroidism   . Cervical disc disease   . TIA (transient ischemic attack)   . Cancer     Uteriine  ca 81yr ago partial hysterectomy  . Collagen vascular disease     RA  3-4 yrs ago    Past Surgical History  Procedure Laterality Date  . Abdominal hysterectomy    . Tubal ligation    . Colonoscopy with propofol N/A 06/23/2014    Procedure: COLONOSCOPY WITH PROPOFOL;  Surgeon: MLollie Sails MD;  [facility];  Service: Endoscopy;  Laterality: N/A;  . Esophagogastroduodenoscopy N/A 06/23/2014    Procedure: ESOPHAGOGASTRODUODENOSCOPY (EGD);  Surgeon: MLollie Sails MD;  [facility];  Service: Endoscopy;  Laterality: N/A;  . Cholecystectomy N/A 07/15/2014    Procedure: LAPAROSCOPIC CHOLECYSTECTOMY with liver biopsy ;  Surgeon: MSherri Rad MD;  Location: [facility] ORS;  Service: General;  Laterality: N/A;  . Ercp N/A 07/16/2014     Procedure: ENDOSCOPIC RETROGRADE CHOLANGIOPANCREATOGRAPHY (ERCP);  Surgeon: MClarene Essex MD;  [facility];  Service: Endoscopy;  Laterality: N/A;    Family History  Problem Relation Age of Onset  . Lung cancer Father   . Ulcers Father   . Heart disease Lori   . Ulcers Lori   . Heart disease Brother    Social History:  reports that she has never smoked. She has never used smokeless tobacco. She reports that she does not drink alcohol or use illicit drugs.  Allergies:  Allergies  Allergen Reactions  . Hydrocodone Itching    Other reaction(s): ITCHING  . Vicodin [Hydrocodone-Acetaminophen] Rash     (Not in a hospital admission)   Review of Systems  Constitutional: Positive for fever. Negative for chills and weight loss.  HENT: Negative.   Eyes: Negative.   Respiratory: Negative.   Cardiovascular: Negative.   Gastrointestinal: Positive for abdominal pain. Negative for nausea, vomiting and diarrhea.  Genitourinary: Negative.   Musculoskeletal: Negative.   Skin: Negative.   Neurological: Negative.  Negative for weakness.  Endo/Heme/Allergies: Negative.   Psychiatric/Behavioral: Negative.      Physical Exam:  BP 118/86 mmHg  Pulse 110  Temp(Src) 98.3 F (36.8 C) (Oral)  Resp 18  Ht 5' 2"  (1.575 m)  Wt 242 lb (109.77 kg)  BMI 44.25 kg/m2  SpO2 97%  Physical Exam  Constitutional:  She is oriented to person, place, and time and well-developed, well-nourished, and in no distress. No distress.  Obese, no distress  HENT:  Head: Normocephalic and atraumatic.  Eyes: No scleral icterus.  Cardiovascular: Normal rate and regular rhythm.   Pulmonary/Chest: Effort normal and breath sounds normal. No respiratory distress. She has no wheezes. She has no rales.  Abdominal: Soft. Bowel sounds are normal. She exhibits no distension. There is tenderness. There is no rebound and no guarding.  Multiple scars are present her abdomen is essentially nontender but very  minimal tender around the previous drain site.  Musculoskeletal: Normal range of motion.  Lymphadenopathy:    She has no cervical adenopathy.  Neurological: She is alert and oriented to person, place, and time.  Skin: Skin is warm and dry.  Psychiatric: Mood and affect normal.        Results for orders placed or performed during the hospital encounter of 08/25/14 (from the past 48 hour(s))  Basic metabolic panel     Status: Abnormal   Collection Time: 08/25/14 11:18 AM  Result Value Ref Range   Sodium 137 135 - 145 mmol/L   Potassium 3.7 3.5 - 5.1 mmol/L   Chloride 101 101 - 111 mmol/L   CO2 29 22 - 32 mmol/L   Glucose, Bld 115 (H) 65 - 99 mg/dL   BUN 6 6 - 20 mg/dL   Creatinine, Ser 0.50 0.44 - 1.00 mg/dL   Calcium 8.3 (L) 8.9 - 10.3 mg/dL   GFR calc non Af Amer >60 >60 mL/min   GFR calc Af Amer >60 >60 mL/min    Comment: (NOTE) The eGFR has been calculated using the CKD EPI equation. This calculation has not been validated in all clinical situations. eGFR's persistently <60 mL/min signify possible Chronic Kidney Disease.    Anion gap 7 5 - 15  CBC     Status: Abnormal   Collection Time: 08/25/14 11:18 AM  Result Value Ref Range   WBC 6.1 3.6 - 11.0 K/uL   RBC 3.40 (L) 3.80 - 5.20 MIL/uL   Hemoglobin 9.9 (L) 12.0 - 16.0 g/dL   HCT 30.5 (L) 35.0 - 47.0 %   MCV 89.6 80.0 - 100.0 fL   MCH 29.3 26.0 - 34.0 pg   MCHC 32.6 32.0 - 36.0 g/dL   RDW 15.2 (H) 11.5 - 14.5 %   Platelets 248 150 - 440 K/uL  Troponin I     Status: None   Collection Time: 08/25/14 11:18 AM  Result Value Ref Range   Troponin I <0.03 <0.031 ng/mL    Comment:        NO INDICATION OF MYOCARDIAL INJURY.   Hepatic function panel     Status: Abnormal   Collection Time: 08/25/14 11:18 AM  Result Value Ref Range   Total Protein 7.3 6.5 - 8.1 g/dL   Albumin 2.8 (L) 3.5 - 5.0 g/dL   AST 25 15 - 41 U/L   ALT 12 (L) 14 - 54 U/L   Alkaline Phosphatase 46 38 - 126 U/L   Total Bilirubin 0.4 0.3 - 1.2  mg/dL   Bilirubin, Direct 0.2 0.1 - 0.5 mg/dL   Indirect Bilirubin 0.2 (L) 0.3 - 0.9 mg/dL  Lipase, blood     Status: Abnormal   Collection Time: 08/25/14 11:18 AM  Result Value Ref Range   Lipase 13 (L) 22 - 51 U/L  Body fluid culture     Status: None   Collection Time: 08/25/14  3:43 PM  Result Value Ref Range   Specimen Description ASCITIC    Special Requests Normal    Report Status 08/25/2014 FINAL   Gram stain     Status: None   Collection Time: 08/25/14  3:43 PM  Result Value Ref Range   Specimen Description ASCITIC    Special Requests Normal    Report Status 08/25/2014 FINAL   Urinalysis complete, with microscopic ([facility] only)     Status: Abnormal   Collection Time: 08/25/14  5:09 PM  Result Value Ref Range   Color, Urine YELLOW (A) YELLOW   APPearance CLEAR (A) CLEAR   Glucose, UA NEGATIVE NEGATIVE mg/dL   Bilirubin Urine NEGATIVE NEGATIVE   Ketones, ur NEGATIVE NEGATIVE mg/dL   Specific Gravity, Urine 1.032 (H) 1.005 - 1.030   Hgb urine dipstick NEGATIVE NEGATIVE   pH 6.0 5.0 - 8.0   Protein, ur NEGATIVE NEGATIVE mg/dL   Nitrite NEGATIVE NEGATIVE   Leukocytes, UA TRACE (A) NEGATIVE   RBC / HPF 6-30 0 - 5 RBC/hpf   WBC, UA 6-30 0 - 5 WBC/hpf   Bacteria, UA NONE SEEN NONE SEEN   Squamous Epithelial / LPF 6-30 (A) NONE SEEN   Mucous PRESENT    Budding Yeast PRESENT    Dg Chest 2 View  08/25/2014   CLINICAL DATA:  Shortness of breath and wheezing.  EXAM: CHEST  2 VIEW  COMPARISON:  08/06/2014  FINDINGS: There is a small opacity along the posterior lung bases on the lateral view. This probably represents overlying shadows based on the recent CT from 08/06/2014. No significant airspace disease or pulmonary edema. No acute bone abnormality. No large pleural effusions. Heart and mediastinum are within normal limits.  IMPRESSION: No acute chest abnormality.   Electronically Signed   By: Markus Daft M.D.   On: 08/25/2014 12:14   Ct Abdomen Pelvis W Contrast  08/25/2014    CLINICAL DATA:  Status post laparoscopic cholecystectomy on 07/15/2014, status post ERCP with stent placement for bile leak on 07/16/2014, status post percutaneous drainage for gallbladder fossa abscess on 08/04/2014, with drain removal on 08/21/2014. Now with increased abdominal distention, pain, and fever. No further drainage from abdominal wall puncture site.  EXAM: CT ABDOMEN AND PELVIS WITH CONTRAST  TECHNIQUE: Multidetector CT imaging of the abdomen and pelvis was performed using the standard protocol following bolus administration of intravenous contrast.  CONTRAST:  126m OMNIPAQUE IOHEXOL 300 MG/ML  SOLN  COMPARISON:  08/12/2014.  FINDINGS: Lower chest: Small left and trace right pleural effusions. Associated left lower lobe atelectasis.  Hepatobiliary: Liver is notable for scattered hepatic cysts measuring up to 1.8 cm in the medial segment left hepatic lobe (series 2/ image 22).  Status post cholecystectomy.  3.0 x 3.1 cm fluid collection/seroma in the surgical bed, minimally complicated by hemorrhage along its lateral margin (series 2/ image 31) interval removal of pigtail drainage catheter.  No intrahepatic or extrahepatic ductal dilatation. Biliary drain in the distal common duct, terminating in the duodenum.  Pancreas: Within normal limits.  Spleen: Within normal limits.  Adrenals/Urinary Tract: Adrenal glands are within normal limits.  Kidneys within normal limits.  No hydronephrosis.  Bladder is within normal limits.  Stomach/Bowel: Stomach is within normal limits.  No evidence of bowel obstruction.  Mild colonic diverticulosis, without evidence of diverticulitis.  Vascular/Lymphatic: No evidence of abdominal aortic aneurysm.  No suspicious abdominopelvic lymphadenopathy.  Reproductive: Status post hysterectomy.  Bilateral ovaries are poorly visualized but grossly unremarkable.  Loculated pelvic  ascites in the dependent pelvis measuring 2.7 x 3.8 cm with thin enhancing rim (series 2/ image 39).   Other: Additional moderate abdominopelvic ascites.  Musculoskeletal: Degenerative changes of the visualized thoracolumbar spine.  IMPRESSION: Status post cholecystectomy.  Interval removal of pigtail drainage catheter with residual 3.0 x 3.1 cm fluid collection/seroma in the surgical bed.  No intrahepatic/ extrahepatic ductal dilatation. Distal CBD drain terminates in the duodenum.  Moderate abdominopelvic ascites. Loculated dependent pelvic ascites with thin enhancing rim measuring 2.7 x 3.8 cm.   Electronically Signed   By: Julian Hy M.D.   On: 08/25/2014 17:05     Assessment/Plan  Very small abscess in the upper abdomen and one in the pelvis neither of which are amenable to nor need of drainage due to their size. At this point I would start anabiotic's I believe Zosyn and Vanco have arty been given and I will continue the Zosyn at this point until the results of her ascites culture comes back. He is already requesting Dilaudid. She her family understand the rationale for admission at this point. She may need a PICC line if long-term antibiotic's are required.  Florene Glen, MD, FACS

## 2014-08-25 NOTE — Telephone Encounter (Signed)
Patient's son called again to ask why they were still waiting in the ED waiting room. Patient's son informed that his mothers present problems are not surgery related and that she will need to be seen by the ED physician, and referred to a GI specialist to treat her liver disease. Patient's son acknowledge information.

## 2014-08-25 NOTE — Progress Notes (Signed)
Surgery Clinic  S/p lap chole 07/15/2014 S/p ERCP and stent for bile leak 07/16/2014 Prolonged hospital course for pain control and nausea  S/p percutaneous drainage GB fossa abscess 08/04/2014  Drain removed on 08/21/2014  There has been no further drainage from her abdominal wall puncture site since her office visit last week. Over the weekend her home health nurse stated she had a fever to 101 she's been feeling poorly. She's had continued abdominal pain. Her appetite has been poor. She was instructed to go to the hospital but refused over the weekend.  Her weight is up 10 pounds from last visit. She's had increased abdominal distention as well.  Filed Vitals:   08/25/14 0948  BP: 130/73  Pulse: 114  Temp: 98.3 F (36.8 C)    Physical examination: Obese white female. Her abdomen is much more protuberant than it has been in the past with an ascitic fluid weight. Suture in the right upper quadrant is intact. There is no drainage or cellulitis present. Her abdomen is soft and with each usual degree of tenderness.  Impression:  Complex 49 year old white female with known liver disease and ascites. Question is whether she has an intra-abdominal infection related to infected ascites. She has been on Augmentin and antibiotics for some time.  Plan:  I recommended to her to go to the emergency room. She will think about her options. I gave her the option of [facility] or [facility] as well as [facility].   When she has made a decision we will recommend her to be admitted to the medical service. We will consult as needed.

## 2014-08-25 NOTE — ED Notes (Signed)
Pt states she gained 10lbs since Friday, fever over the weeked, sent by Dr Felton Clinton.

## 2014-08-25 NOTE — ED Provider Notes (Signed)
[facility] Emergency Department Provider Note  Time seen: 3:10 PM  I have reviewed the triage vital signs and the nursing notes.   HISTORY  Chief Complaint Weight Gain and Wound Check    HPI Lori Mckenzie is a 49 y.o. female with a past medical history of asthma, hypertension, diabetes, chronic kidney disease, anxiety, COPD, presents to the emergency department with abdominal pain, and fever. Patient has a, located past medical history of cholecystitis in early July, with a laparoscopic cholecystectomy 7/12, compensated by a bile leak needing ERCP. Patient had an extended hospital course for abdominal pain, nausea and vomiting. Most recently the patient had a intra-abdominal drain removed, with ascitic fluid leaking from the abdomen. Patient has had increased abdominal pain over the past 3-4 days, has had fever for the past 2 days to 101.5 at home including today. Patient saw Dr. Rexene Edison on Friday, who sewed closed the hole from the intra-abdominal drain. She saw Dr. Marina Gravel today, and had a fever 101, and was sent to the emergency department for further workup and admission per patient. Patient continues to feel nauseated, states 10/10 abdominal pain. Patient states she took Tylenol prior to arrival in the emergency department.     Past Medical History  Diagnosis Date  . Brittle bone disease   . Asthma   . Thyroid disease   . Hypertension   . Back pain   . Diabetes mellitus without complication   . Chronic kidney disease   . Anxiety   . GERD (gastroesophageal reflux disease)   . Sleep apnea   . COPD (chronic obstructive pulmonary disease)   . Hypothyroidism   . Cervical disc disease   . TIA (transient ischemic attack)   . Cancer     Uteriine  ca 57yr ago partial hysterectomy  . Collagen vascular disease     RA  3-4 yrs ago    Patient Active Problem List   Diagnosis Date Noted  . Dysthymia 08/05/2014  . Other social stressor 08/05/2014  . Abscess of  abdominal cavity 08/03/2014  . Bile leak, postoperative 07/17/2014  . Steatohepatitis 07/15/2014  . Cholecystitis   . Biliary colic 099/37/1696 . Abdominal pain   . Type 2 diabetes mellitus 07/12/2014  . COPD (chronic obstructive pulmonary disease) 07/12/2014  . HTN (hypertension) 07/12/2014  . GERD (gastroesophageal reflux disease) 07/12/2014  . OSA on CPAP 07/12/2014  . Anxiety 07/12/2014    Past Surgical History  Procedure Laterality Date  . Abdominal hysterectomy    . Tubal ligation    . Colonoscopy with propofol N/A 06/23/2014    Procedure: COLONOSCOPY WITH PROPOFOL;  Surgeon: MLollie Sails MD;  [facility];  Service: Endoscopy;  Laterality: N/A;  . Esophagogastroduodenoscopy N/A 06/23/2014    Procedure: ESOPHAGOGASTRODUODENOSCOPY (EGD);  Surgeon: MLollie Sails MD;  [facility];  Service: Endoscopy;  Laterality: N/A;  . Cholecystectomy N/A 07/15/2014    Procedure: LAPAROSCOPIC CHOLECYSTECTOMY with liver biopsy ;  Surgeon: MSherri Rad MD;  Location: [facility] ORS;  Service: General;  Laterality: N/A;  . Ercp N/A 07/16/2014    Procedure: ENDOSCOPIC RETROGRADE CHOLANGIOPANCREATOGRAPHY (ERCP);  Surgeon: MClarene Essex MD;  [facility];  Service: Endoscopy;  Laterality: N/A;    Current Outpatient Rx  Name  Route  Sig  Dispense  Refill  . albuterol (PROVENTIL HFA;VENTOLIN HFA) 108 (90 BASE) MCG/ACT inhaler   Inhalation   Inhale 2 puffs into the lungs every 4 (four) hours as needed for wheezing or shortness  of breath.         . ALPRAZolam (XANAX) 1 MG tablet   Oral   Take 1 tablet (1 mg total) by mouth 3 (three) times daily as needed for anxiety.   30 tablet   0   . amoxicillin-clavulanate (AUGMENTIN) 875-125 MG per tablet   Oral   Take 1 tablet by mouth 2 (two) times daily.   14 tablet   0   . citalopram (CELEXA) 20 MG tablet   Oral   Take 20 mg by mouth daily.         . clotrimazole (MYCELEX) 10 MG troche   Oral   Take 10 mg by  mouth 4 (four) times daily as needed (mouth dryness).         . fluticasone (FLONASE) 50 MCG/ACT nasal spray   Each Nare   Place 1 spray into both nostrils daily as needed (dryness).          . Fluticasone-Salmeterol (ADVAIR) 100-50 MCG/DOSE AEPB   Inhalation   Inhale 1 puff into the lungs 2 (two) times daily.         . furosemide (LASIX) 40 MG tablet   Oral   Take 80 mg by mouth 2 (two) times daily.          Marland Kitchen gabapentin (NEURONTIN) 100 MG capsule   Oral   Take 300 mg by mouth 4 (four) times daily as needed (pain).         . hydrochlorothiazide (HYDRODIURIL) 25 MG tablet   Oral   Take 12.5 mg by mouth daily as needed (id diastolic blood pressure >80).          Marland Kitchen ibuprofen (ADVIL,MOTRIN) 800 MG tablet   Oral   Take 800 mg by mouth every 8 (eight) hours as needed for moderate pain.          Marland Kitchen ipratropium (ATROVENT HFA) 17 MCG/ACT inhaler   Inhalation   Inhale 2 puffs into the lungs every 8 (eight) hours.         . metFORMIN (GLUCOPHAGE) 1000 MG tablet   Oral   Take 500-1,000 mg by mouth 2 (two) times daily. Dose range based on blood sugar         . methocarbamol (ROBAXIN) 500 MG tablet   Oral   Take 500 mg by mouth 2 (two) times daily as needed for muscle spasms.          Marland Kitchen omeprazole (PRILOSEC) 20 MG capsule   Oral   Take 40 mg by mouth 2 (two) times daily as needed (acid reflux).         Marland Kitchen oxyCODONE-acetaminophen (PERCOCET/ROXICET) 5-325 MG per tablet   Oral   Take 1 tablet by mouth every 6 (six) hours as needed for severe pain.   20 tablet   0   . OXYGEN   Inhalation   Inhale 1.5-2 L into the lungs continuous.          . rizatriptan (MAXALT) 10 MG tablet   Oral   Take 10 mg by mouth as needed for migraine. May repeat in 2 hours if needed           Allergies Hydrocodone and Vicodin  Family History  Problem Relation Age of Onset  . Lung cancer Father   . Ulcers Father   . Heart disease Sister   . Ulcers Sister   . Heart  disease Brother     Social History Social History  Substance Use Topics  .  Smoking status: Never Smoker   . Smokeless tobacco: Never Used  . Alcohol Use: No     Comment: occ    Review of Systems Constitutional: Positive for fever to 101 including today per patient. Cardiovascular: Negative for chest pain. Respiratory: Negative for shortness of breath. Gastrointestinal: Positive for abdominal pain and nausea. Negative for diarrhea. Normal bowel movement this morning. Genitourinary: Negative for dysuria. Negative for hematuria. Musculoskeletal: Negative for back pain Neurological: Negative for headache 10-point ROS otherwise negative.  ____________________________________________   PHYSICAL EXAM:  VITAL SIGNS: ED Triage Vitals  Enc Vitals Group     BP 08/25/14 1104 118/86 mmHg     Pulse Rate 08/25/14 1104 110     Resp 08/25/14 1104 18     Temp 08/25/14 1104 98.3 F (36.8 C)     Temp Source 08/25/14 1104 Oral     SpO2 08/25/14 1104 97 %     Weight 08/25/14 1104 146 lb (66.225 kg)     Height 08/25/14 1104 5' 2"  (1.575 m)     Head Cir --      Peak Flow --      Pain Score 08/25/14 1105 9     Pain Loc --      Pain Edu? --      Excl. in [place]? --     Constitutional: Alert and oriented. Well appearing and in no distress. Eyes: Normal exam ENT   Head: Normocephalic and atraumatic. Cardiovascular: Normal rate, regular rhythm. No murmur Respiratory: Normal respiratory effort without tachypnea nor retractions. Breath sounds are clear and equal bilaterally. No wheezes/rales/rhonchi. Gastrointestinal: Distended abdomen, moderate diffuse abdominal tenderness palpation, no rebound or guarding. Musculoskeletal: Nontender with normal range of motion in all extremities Neurologic:  Normal speech and language. No gross focal neurologic deficits  Skin:  Skin is warm, dry and intact.  Psychiatric: Mood and affect are normal. Speech and behavior are  normal.  ____________________________________________    EKG  EKG reviewed and interpreted by myself shows sinus tachycardia 112 bpm, narrow QRS, normal axis, normal intervals, nonspecific ST changes present. No ST elevations noted.  ____________________________________________    RADIOLOGY  IMPRESSION: Status post cholecystectomy.  Interval removal of pigtail drainage catheter with residual 3.0 x 3.1 cm fluid collection/seroma in the surgical bed.  No intrahepatic/ extrahepatic ductal dilatation. Distal CBD drain terminates in the duodenum.  Moderate abdominopelvic ascites. Loculated dependent pelvic ascites with thin enhancing rim measuring 2.7 x 3.8 cm.   Electronically Signed By: Julian Hy M.D. On: 08/25/2014 17:05        ____________________________________________   INITIAL IMPRESSION / ASSESSMENT AND PLAN / ED COURSE  Pertinent labs & imaging results that were available during my care of the patient were reviewed by me and considered in my medical decision making (see chart for details).  Patient with a distended abdomen, fever per patient up to 101 including today. Patient states she took Tylenol prior to arrival, currently the patient is afebrile. The patient does have moderate abdominal tenderness diffusely. Bedside ultrasound shows a significant amount of intra-abdominal fluid/ascitic fluid. We will start the patient on empiric antibiotics, perform ultrasound-guided paracentesis, to help rule out SBP. Labs are largely within normal limits, I have added on a hepatic function panel as well as a lipase to help further evaluate the patient. Anticipated admission.  Ultrasound-guided paracentesis performed by myself: Verbal consent obtained from patient. Patient's abdomen examined the ultrasound, large pocket of intra-abdominal fluid found in the left lower quadrant. Skin prepped with Cloraprep,  local anesthetic lidocaine with epinephrine approximately  10 ML's used. 18-gauge needle used, 10 ML's of ascitic fluid removed. Yellow in color, no sediment noted. Patient tolerated procedure very well.  Ultrasound-guided intravenous access obtained by myself. Ultrasound used to visualize patient's left antecubital fossa, vein visualize. Skin is prepped with chloraprep.  22-gauge IV placed in the left arm using sterile techniques. Patient tolerated procedure very well. Blood draws back very easily, flushes very easily without pain.     CT shows loculated area of ascitic fluid in the pelvis. Largely otherwise within normal limits. I discussed the patient as well as the findings with Dr. Burt Knack. Dr. Burt Knack has seen the patient and will be admitting for further treatment and evaluation. Labs so far within normal limits, ascitic fluid cell count/differential pending. ____________________________________________   FINAL CLINICAL IMPRESSION(S) / ED DIAGNOSES  Fever Abdominal pain   Harvest Dark, MD 08/25/14 1756

## 2014-08-26 ENCOUNTER — Inpatient Hospital Stay: Payer: Medicaid Other

## 2014-08-26 LAB — CBC
HCT: 27.6 % — ABNORMAL LOW (ref 35.0–47.0)
Hemoglobin: 8.9 g/dL — ABNORMAL LOW (ref 12.0–16.0)
MCH: 28.9 pg (ref 26.0–34.0)
MCHC: 32.3 g/dL (ref 32.0–36.0)
MCV: 89.4 fL (ref 80.0–100.0)
Platelets: 220 10*3/uL (ref 150–440)
RBC: 3.08 MIL/uL — ABNORMAL LOW (ref 3.80–5.20)
RDW: 15.1 % — ABNORMAL HIGH (ref 11.5–14.5)
WBC: 5.2 10*3/uL (ref 3.6–11.0)

## 2014-08-26 NOTE — Progress Notes (Signed)
Pt is lying in bed sleeping, will wake up when talked to, but pt is very groggy and is slurring her words. Pt was able to take her metformin this A.M. Will re-assess before giving other PO medications.

## 2014-08-26 NOTE — Progress Notes (Signed)
Pt admitted from ED to Room 227 at approx.1830. Pt A&O x4. IV access intact.  Admission & Assessment completed.   Oriented pt to room. Education provided on call bell, telephone, IVpole/IV alarms. 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 White board completed and updated as needed.  Reviewed diet/medication orders with pt & family. Pt asleep and not able to answer admission questions. Pt's family states that the only medication pt had at home today was metformin. Administered all other medications as ordered- see MAR. Family educated on importance of adhering to clear liquid diet orders. Family expressed understanding.  VSS. Family at bedside and will continue to monitor.

## 2014-08-26 NOTE — Progress Notes (Signed)
Pt's husband was noted to be standing outside of her room leaned up against the wall about to fall over by the NT. The NT told the husband to sit down before he fell over. When I went into the room, the pt's husband was standing over her bed about to fall over. The Pt asked the husband to sit down. Once he was sitting down, the husband began mumbling words that could not be made out, like he was talking in his sleep. The pt then began doing the same thing. I was able to wake the pt up to give her morning medications. Before I left the room, I encouraged the husband to sit down and lean back in the chair to prevent him from falling out. The pt's husband was AxO this morning during my first encounter with him.

## 2014-08-26 NOTE — Progress Notes (Signed)
CC: Abdominal pain Subjective: Complains of abdominal pain and right shoulder pain she did not have a fall but experienced what she called a popping sensation when she was lifting this was noted in the ER yesterday. Denies nausea vomiting but is asking for more dilaudid  Objective: Vital signs in last 24 hours: Temp:  [97.9 F (36.6 C)-98.1 F (36.7 C)] 97.9 F (36.6 C) (08/23 0732) Pulse Rate:  [89-96] 96 (08/23 0732) Resp:  [19-24] 19 (08/23 0732) BP: (89-103)/(51-82) 103/82 mmHg (08/23 0800) SpO2:  [93 %-96 %] 93 % (08/23 0732) Weight:  [242 lb (109.77 kg)] 242 lb (109.77 kg) (08/22 1440)    Intake/Output from previous day: 08/22 0701 - 08/23 0700 In: 67 [IV Piggyback:48] Out: 100 [Urine:100] Intake/Output this shift: Total I/O In: 600 [P.O.:600] Out: 1050 [Urine:1050]  Physical exam:   Soft nontender abdomen of note the patient is very somnolent slurring her speech and having very trouble keeping her eyes open SCDs are on the floor Lab Results: CBC   Recent Labs  08/25/14 1118 08/26/14 0613  WBC 6.1 5.2  HGB 9.9* 8.9*  HCT 30.5* 27.6*  PLT 248 220   BMET  Recent Labs  08/25/14 1118  NA 137  K 3.7  CL 101  CO2 29  GLUCOSE 115*  BUN 6  CREATININE 0.50  CALCIUM 8.3*   PT/INR No results for input(s): LABPROT, INR in the last 72 hours. ABG No results for input(s): PHART, HCO3 in the last 72 hours.  Invalid input(s): PCO2, PO2  Studies/Results: Dg Chest 2 View  08/25/2014   CLINICAL DATA:  Shortness of breath and wheezing.  EXAM: CHEST  2 VIEW  COMPARISON:  08/06/2014  FINDINGS: There is a small opacity along the posterior lung bases on the lateral view. This probably represents overlying shadows based on the recent CT from 08/06/2014. No significant airspace disease or pulmonary edema. No acute bone abnormality. No large pleural effusions. Heart and mediastinum are within normal limits.  IMPRESSION: No acute chest abnormality.   Electronically Signed    By: Markus Daft M.D.   On: 08/25/2014 12:14   Ct Abdomen Pelvis W Contrast  08/25/2014   CLINICAL DATA:  Status post laparoscopic cholecystectomy on 07/15/2014, status post ERCP with stent placement for bile leak on 07/16/2014, status post percutaneous drainage for gallbladder fossa abscess on 08/04/2014, with drain removal on 08/21/2014. Now with increased abdominal distention, pain, and fever. No further drainage from abdominal wall puncture site.  EXAM: CT ABDOMEN AND PELVIS WITH CONTRAST  TECHNIQUE: Multidetector CT imaging of the abdomen and pelvis was performed using the standard protocol following bolus administration of intravenous contrast.  CONTRAST:  166m OMNIPAQUE IOHEXOL 300 MG/ML  SOLN  COMPARISON:  08/12/2014.  FINDINGS: Lower chest: Small left and trace right pleural effusions. Associated left lower lobe atelectasis.  Hepatobiliary: Liver is notable for scattered hepatic cysts measuring up to 1.8 cm in the medial segment left hepatic lobe (series 2/ image 22).  Status post cholecystectomy.  3.0 x 3.1 cm fluid collection/seroma in the surgical bed, minimally complicated by hemorrhage along its lateral margin (series 2/ image 31) interval removal of pigtail drainage catheter.  No intrahepatic or extrahepatic ductal dilatation. Biliary drain in the distal common duct, terminating in the duodenum.  Pancreas: Within normal limits.  Spleen: Within normal limits.  Adrenals/Urinary Tract: Adrenal glands are within normal limits.  Kidneys within normal limits.  No hydronephrosis.  Bladder is within normal limits.  Stomach/Bowel: Stomach is within normal  limits.  No evidence of bowel obstruction.  Mild colonic diverticulosis, without evidence of diverticulitis.  Vascular/Lymphatic: No evidence of abdominal aortic aneurysm.  No suspicious abdominopelvic lymphadenopathy.  Reproductive: Status post hysterectomy.  Bilateral ovaries are poorly visualized but grossly unremarkable.  Loculated pelvic ascites in the  dependent pelvis measuring 2.7 x 3.8 cm with thin enhancing rim (series 2/ image 39).  Other: Additional moderate abdominopelvic ascites.  Musculoskeletal: Degenerative changes of the visualized thoracolumbar spine.  IMPRESSION: Status post cholecystectomy.  Interval removal of pigtail drainage catheter with residual 3.0 x 3.1 cm fluid collection/seroma in the surgical bed.  No intrahepatic/ extrahepatic ductal dilatation. Distal CBD drain terminates in the duodenum.  Moderate abdominopelvic ascites. Loculated dependent pelvic ascites with thin enhancing rim measuring 2.7 x 3.8 cm.   Electronically Signed   By: Julian Hy M.D.   On: 08/25/2014 17:05    Anti-infectives: Anti-infectives    Start     Dose/Rate Route Frequency Ordered Stop   08/25/14 2200  piperacillin-tazobactam (ZOSYN) IVPB 3.375 g     3.375 g 12.5 mL/hr over 240 Minutes Intravenous 3 times per day 08/25/14 1823     08/25/14 1515  vancomycin (VANCOCIN) IVPB 1000 mg/200 mL premix     1,000 mg 200 mL/hr over 60 Minutes Intravenous  Once 08/25/14 1503 08/25/14 1708   08/25/14 1515  piperacillin-tazobactam (ZOSYN) IVPB 3.375 g     3.375 g 12.5 mL/hr over 240 Minutes Intravenous  Once 08/25/14 1503 08/25/14 1709      Assessment/Plan:  We'll get shoulder x-rays. Currently her abdominal exam remains benign and she has a normal white blood cell count on IV anabiotic's. She is over medicated at this point from a narcotic standpoint both her husband and son were noted to be intoxicated earlier today by both me and the nursing staff her son can barely keep his eyes open the nursing staff had a huge bag of multiple medications from the patient.  Florene Glen, MD, FACS  08/26/2014

## 2014-08-26 NOTE — Progress Notes (Signed)
Received call from CCU regarding pt's telemetry leads. I was on the way to the pt's room and passed the husband and son of the pt in the supply room. They were getting the mesh panties for the pt. I informed them that if they needed supplies to inform me or the assistant. I went into the pt's room and she was in the bathroom without her IV pole, which was beeping because it said paused. The pt was only getting Zosyn IV, but it had not finished completely yet and the tubing was lying on the floor leaking out. I asked who unhooked her IV and they all said none of them did it. I informed them also that it is not safe to unhook her from her IV because she did not get all of her antibiotic that she needed. The pt had also taken her telemetry leads off in order to wash up, but I informed her to let a nurse or assistant know before she does that so we can assist her. Pt got back into bed after her wash up and leads were hooked back up.

## 2014-08-27 ENCOUNTER — Inpatient Hospital Stay: Payer: Medicaid Other

## 2014-08-27 LAB — URINE CULTURE: Culture: 6000

## 2014-08-27 MED ORDER — ALBUTEROL SULFATE (2.5 MG/3ML) 0.083% IN NEBU
2.5000 mg | INHALATION_SOLUTION | RESPIRATORY_TRACT | Status: DC | PRN
  Administered 2014-08-27: 2.5 mg via RESPIRATORY_TRACT
  Filled 2014-08-27: qty 3

## 2014-08-27 NOTE — Progress Notes (Signed)
Subjective: A she'll readmitted for small abdominal abscess following laparoscopic cholecystectomy with biliary fistula. She states that her abdomen feels better today but she is experiencing some wheezing. No nausea or vomiting she wants to eat a regular diet.  Objective: Vital signs in last 24 hours: Temp:  [97.8 F (36.6 C)-99 F (37.2 C)] 97.8 F (36.6 C) (08/24 0800) Pulse Rate:  [98-106] 98 (08/24 0800) Resp:  [14-18] 14 (08/24 0800) BP: (106-111)/(68-77) 110/77 mmHg (08/24 0800) SpO2:  [94 %-96 %] 94 % (08/24 0800) Last BM Date: 08/26/14  Intake/Output from previous day: 08/23 0701 - 08/24 0700 In: 1002 [P.O.:840; I.V.:3; IV Piggyback:159] Out: 2650 [Urine:2650] Intake/Output this shift: Total I/O In: 328 [P.O.:240; IV Piggyback:88] Out: 200 [Urine:200]  Physical exam:  Patient appears somnolent having trouble holding her eyes open. Chest shows bilateral wheezes. Abdomen is soft and essentially nontender wounds are well healed. Calves are nontender  Lab Results: CBC   Recent Labs  08/25/14 1118 08/26/14 0613  WBC 6.1 5.2  HGB 9.9* 8.9*  HCT 30.5* 27.6*  PLT 248 220   BMET  Recent Labs  08/25/14 1118  NA 137  K 3.7  CL 101  CO2 29  GLUCOSE 115*  BUN 6  CREATININE 0.50  CALCIUM 8.3*   PT/INR No results for input(s): LABPROT, INR in the last 72 hours. ABG No results for input(s): PHART, HCO3 in the last 72 hours.  Invalid input(s): PCO2, PO2  Studies/Results: Dg Chest 2 View  08/25/2014   CLINICAL DATA:  Shortness of breath and wheezing.  EXAM: CHEST  2 VIEW  COMPARISON:  08/06/2014  FINDINGS: There is a small opacity along the posterior lung bases on the lateral view. This probably represents overlying shadows based on the recent CT from 08/06/2014. No significant airspace disease or pulmonary edema. No acute bone abnormality. No large pleural effusions. Heart and mediastinum are within normal limits.  IMPRESSION: No acute chest abnormality.    Electronically Signed   By: Markus Daft M.D.   On: 08/25/2014 12:14   Dg Shoulder Right  08/26/2014   CLINICAL DATA:  Right shoulder pain.  EXAM: RIGHT SHOULDER - 2+ VIEW  COMPARISON:  None.  FINDINGS: There is no evidence of fracture or dislocation. There is no evidence of arthropathy or other focal bone abnormality. Soft tissues are unremarkable.  IMPRESSION: Negative.   Electronically Signed   By: Kerby Moors M.D.   On: 08/26/2014 17:40   Ct Abdomen Pelvis W Contrast  08/25/2014   CLINICAL DATA:  Status post laparoscopic cholecystectomy on 07/15/2014, status post ERCP with stent placement for bile leak on 07/16/2014, status post percutaneous drainage for gallbladder fossa abscess on 08/04/2014, with drain removal on 08/21/2014. Now with increased abdominal distention, pain, and fever. No further drainage from abdominal wall puncture site.  EXAM: CT ABDOMEN AND PELVIS WITH CONTRAST  TECHNIQUE: Multidetector CT imaging of the abdomen and pelvis was performed using the standard protocol following bolus administration of intravenous contrast.  CONTRAST:  168m OMNIPAQUE IOHEXOL 300 MG/ML  SOLN  COMPARISON:  08/12/2014.  FINDINGS: Lower chest: Small left and trace right pleural effusions. Associated left lower lobe atelectasis.  Hepatobiliary: Liver is notable for scattered hepatic cysts measuring up to 1.8 cm in the medial segment left hepatic lobe (series 2/ image 22).  Status post cholecystectomy.  3.0 x 3.1 cm fluid collection/seroma in the surgical bed, minimally complicated by hemorrhage along its lateral margin (series 2/ image 31) interval removal of pigtail drainage catheter.  No intrahepatic or extrahepatic ductal dilatation. Biliary drain in the distal common duct, terminating in the duodenum.  Pancreas: Within normal limits.  Spleen: Within normal limits.  Adrenals/Urinary Tract: Adrenal glands are within normal limits.  Kidneys within normal limits.  No hydronephrosis.  Bladder is within normal  limits.  Stomach/Bowel: Stomach is within normal limits.  No evidence of bowel obstruction.  Mild colonic diverticulosis, without evidence of diverticulitis.  Vascular/Lymphatic: No evidence of abdominal aortic aneurysm.  No suspicious abdominopelvic lymphadenopathy.  Reproductive: Status post hysterectomy.  Bilateral ovaries are poorly visualized but grossly unremarkable.  Loculated pelvic ascites in the dependent pelvis measuring 2.7 x 3.8 cm with thin enhancing rim (series 2/ image 39).  Other: Additional moderate abdominopelvic ascites.  Musculoskeletal: Degenerative changes of the visualized thoracolumbar spine.  IMPRESSION: Status post cholecystectomy.  Interval removal of pigtail drainage catheter with residual 3.0 x 3.1 cm fluid collection/seroma in the surgical bed.  No intrahepatic/ extrahepatic ductal dilatation. Distal CBD drain terminates in the duodenum.  Moderate abdominopelvic ascites. Loculated dependent pelvic ascites with thin enhancing rim measuring 2.7 x 3.8 cm.   Electronically Signed   By: Julian Hy M.D.   On: 08/25/2014 17:05    Anti-infectives: Anti-infectives    Start     Dose/Rate Route Frequency Ordered Stop   08/25/14 2200  piperacillin-tazobactam (ZOSYN) IVPB 3.375 g     3.375 g 12.5 mL/hr over 240 Minutes Intravenous 3 times per day 08/25/14 1823     08/25/14 1515  vancomycin (VANCOCIN) IVPB 1000 mg/200 mL premix     1,000 mg 200 mL/hr over 60 Minutes Intravenous  Once 08/25/14 1503 08/25/14 1708   08/25/14 1515  piperacillin-tazobactam (ZOSYN) IVPB 3.375 g     3.375 g 12.5 mL/hr over 240 Minutes Intravenous  Once 08/25/14 1503 08/25/14 1709      Assessment/Plan: s/p    Right shoulder films were normal blood cultures and fluid cultures were normal and negative.  I will review chest x-ray is started Ventolin inhaler today. Continue IV anabiotic's at this point.  Florene Glen, MD, FACS  08/27/2014

## 2014-08-28 LAB — CBC WITH DIFFERENTIAL/PLATELET
Basophils Absolute: 0.1 10*3/uL (ref 0–0.1)
Basophils Relative: 1 %
Eosinophils Absolute: 0.1 10*3/uL (ref 0–0.7)
Eosinophils Relative: 2 %
HCT: 27.4 % — ABNORMAL LOW (ref 35.0–47.0)
Hemoglobin: 9.2 g/dL — ABNORMAL LOW (ref 12.0–16.0)
Lymphocytes Relative: 19 %
Lymphs Abs: 1.3 10*3/uL (ref 1.0–3.6)
MCH: 29.7 pg (ref 26.0–34.0)
MCHC: 33.7 g/dL (ref 32.0–36.0)
MCV: 88.1 fL (ref 80.0–100.0)
Monocytes Absolute: 0.5 10*3/uL (ref 0.2–0.9)
Monocytes Relative: 7 %
Neutro Abs: 4.8 10*3/uL (ref 1.4–6.5)
Neutrophils Relative %: 71 %
Platelets: 251 10*3/uL (ref 150–440)
RBC: 3.11 MIL/uL — ABNORMAL LOW (ref 3.80–5.20)
RDW: 15.3 % — ABNORMAL HIGH (ref 11.5–14.5)
WBC: 6.7 10*3/uL (ref 3.6–11.0)

## 2014-08-28 MED ORDER — IOHEXOL 240 MG/ML SOLN
25.0000 mL | Freq: Once | INTRAMUSCULAR | Status: AC | PRN
  Administered 2014-08-29: 25 mL via ORAL

## 2014-08-28 MED ORDER — IOHEXOL 240 MG/ML SOLN
25.0000 mL | Freq: Once | INTRAMUSCULAR | Status: AC | PRN
  Administered 2014-08-28: 25 mL via ORAL

## 2014-08-28 NOTE — Progress Notes (Signed)
Subjective: I discussed this patient's care with the patient and his wound as well as nursing staff. Nursing states she's not had any pain medication today. She is more alert today than she has been early more conversant but complains of abdominal pain. He has no nausea vomiting  Objective: Vital signs in last 24 hours: Temp:  [97.9 F (36.6 C)-98.2 F (36.8 C)] 98.2 F (36.8 C) (08/25 0836) Pulse Rate:  [70-96] 70 (08/25 0836) Resp:  [16-18] 18 (08/25 0836) BP: (91-112)/(69-81) 102/76 mmHg (08/25 1050) SpO2:  [90 %-97 %] 97 % (08/24 2326) Last BM Date: 08/27/14  Intake/Output from previous day: 08/24 0701 - 08/25 0700 In: 568 [P.O.:480; IV Piggyback:88] Out: 700 [Urine:700] Intake/Output this shift: Total I/O In: 480 [P.O.:480] Out: 0   Physical exam:  Abdomen is distended and nontympanitic and essentially unchanged from previous exams. It is only minimally tender if at all. Wounds are well healed without erythema. Calves are nontender.  Lab Results: CBC   Recent Labs  08/26/14 0613 08/28/14 0513  WBC 5.2 6.7  HGB 8.9* 9.2*  HCT 27.6* 27.4*  PLT 220 251   BMET No results for input(s): NA, K, CL, CO2, GLUCOSE, BUN, CREATININE, CALCIUM in the last 72 hours. PT/INR No results for input(s): LABPROT, INR in the last 72 hours. ABG No results for input(s): PHART, HCO3 in the last 72 hours.  Invalid input(s): PCO2, PO2  Studies/Results: Dg Shoulder Right  08/26/2014   CLINICAL DATA:  Right shoulder pain.  EXAM: RIGHT SHOULDER - 2+ VIEW  COMPARISON:  None.  FINDINGS: There is no evidence of fracture or dislocation. There is no evidence of arthropathy or other focal bone abnormality. Soft tissues are unremarkable.  IMPRESSION: Negative.   Electronically Signed   By: Kerby Moors M.D.   On: 08/26/2014 17:40   Dg Chest Port 1 View  08/27/2014   CLINICAL DATA:  BILATERAL wheezing, history COPD, diabetes mellitus with TIA, hypertension  EXAM: PORTABLE CHEST - 1 VIEW   COMPARISON:  Portable exam 1207 hours compared 08/24/2014  FINDINGS: Enlargement of cardiac silhouette with vascular congestion.  Mediastinal contours normal.  Central peribronchial thickening.  Lungs clear.  No pleural effusion or pneumothorax.  No acute osseous findings.  IMPRESSION: Enlargement of cardiac silhouette with pulmonary vascular congestion.  Bronchitic changes without infiltrate.   Electronically Signed   By: Lavonia Molli M.D.   On: 08/27/2014 12:22    Anti-infectives: Anti-infectives    Start     Dose/Rate Route Frequency Ordered Stop   08/25/14 2200  piperacillin-tazobactam (ZOSYN) IVPB 3.375 g     3.375 g 12.5 mL/hr over 240 Minutes Intravenous 3 times per day 08/25/14 1823     08/25/14 1515  vancomycin (VANCOCIN) IVPB 1000 mg/200 mL premix     1,000 mg 200 mL/hr over 60 Minutes Intravenous  Once 08/25/14 1503 08/25/14 1708   08/25/14 1515  piperacillin-tazobactam (ZOSYN) IVPB 3.375 g     3.375 g 12.5 mL/hr over 240 Minutes Intravenous  Once 08/25/14 1503 08/25/14 1709      Assessment/Plan: s/p    CT from 3 days ago reviewed again very small fluid collections but there is ascites present previously recognized. I doubt that these are abscesses and the patient has not had an elevated white blood cell count nor has she been febrile since being in the hospital on antibiotic's. I recommendation would be to proceed his CT scan tomorrow to see if there is any worsening in this area suggestive of  possible abscess and whether or not there is a need for paracentesis.  Florene Glen, MD, FACS  08/28/2014

## 2014-08-28 NOTE — Progress Notes (Signed)
Spoke with Dr. Burt Knack regarding patients BP and distended appearing abdomen.  No new orders.

## 2014-08-28 NOTE — Progress Notes (Signed)
Requested to be present in room by Ethlyn Daniels, RN, Department Director. Present in room is patient's husband. After introducing [place], Sherea explained prescription medications had been found in patient's room, and they were secured in pharmacy until patient discharge. Sherea explained because the patient had been very lethargic and there was concern patient was taking home medications, patient was moved to a room closer to the desk.Husband and patient stated they understood the move and securing the narcotics. Husband expressed concern over patient's distended appearing abdomen. Sherea told family member she would speak with patient's nurse about the concerns.

## 2014-08-29 ENCOUNTER — Inpatient Hospital Stay: Payer: Medicaid Other

## 2014-08-29 ENCOUNTER — Encounter: Payer: Self-pay | Admitting: Radiology

## 2014-08-29 ENCOUNTER — Other Ambulatory Visit: Payer: Self-pay | Admitting: Family Medicine

## 2014-08-29 LAB — BODY FLUID CULTURE
Culture: NO GROWTH
Gram Stain: NONE SEEN
Special Requests: NORMAL

## 2014-08-29 MED ORDER — ASPIRIN 81 MG PO TBEC: 81.0000 mg | DELAYED_RELEASE_TABLET | Freq: Every day | ORAL | Status: DC

## 2014-08-29 MED ORDER — IOHEXOL 300 MG/ML  SOLN
125.0000 mL | Freq: Once | INTRAMUSCULAR | Status: AC | PRN
  Administered 2014-08-29: 125 mL via INTRAVENOUS

## 2014-08-29 NOTE — Progress Notes (Signed)
Initial Nutrition Assessment    INTERVENTION:  Meals and snacks: Cater to pt preferences Nutrition Supplement Therapy: Will add mightyshake TID for added nutrition   NUTRITION DIAGNOSIS:   Inadequate oral intake related to altered GI function as evidenced by per patient/family report.    GOAL:   Patient will meet greater than or equal to 90% of their needs    MONITOR:    (Energy intake, Digestive system)  REASON FOR ASSESSMENT:   LOS    ASSESSMENT:      Pt admitted with ascites, post op complications. Planning CT this am to evaluate for abscess  Past Medical History  Diagnosis Date  . Brittle bone disease   . Asthma   . Thyroid disease   . Hypertension   . Back pain   . Diabetes mellitus without complication   . Chronic kidney disease   . Anxiety   . GERD (gastroesophageal reflux disease)   . Sleep apnea   . COPD (chronic obstructive pulmonary disease)   . Hypothyroidism   . Cervical disc disease   . TIA (transient ischemic attack)   . Cancer     Uteriine  ca 65yr ago partial hysterectomy  . Collagen vascular disease     RA  3-4 yrs ago   Recent lap cholecystectomy with bilary fistula  Current Nutrition: ate most of pancake, 100% of jello and fruit cup this am.    Food/Nutrition-Related History: Pt reports intake has been decreased for the past several months secondary to digestive issues   Medications: lasix, protonix  Electrolyte/Renal Profile and Glucose Profile:   Recent Labs Lab 08/25/14 1118  NA 137  K 3.7  CL 101  CO2 29  BUN 6  CREATININE 0.50  CALCIUM 8.3*  GLUCOSE 115*   Protein Profile:   Recent Labs Lab 08/25/14 1118  ALBUMIN 2.8*    Gastrointestinal Profile: reports abdominal pain during visit this am Last BM:8/24   Nutrition-Focused Physical Exam Findings: Nutrition-Focused physical exam completed. Findings are normal fat depletion, normal muscle depletion, and mild edema.      Weight Change: 2% weight  loss in the last 2 months per wt encounters    Diet Order:  Diet regular Room service appropriate?: Yes; Fluid consistency:: Thin  Skin:   reviewed   Height:   Ht Readings from Last 1 Encounters:  08/25/14 5' 2"  (1.575 m)    Weight:   Wt Readings from Last 1 Encounters:  08/25/14 242 lb (109.77 kg)     BMI:  Body mass index is 44.25 kg/(m^2).  Estimated Nutritional Needs:   Kcal:  Using IBW of 50 kg.  BEE 1068 kcals (IF 1.1-1.3, AF 1.3) 16269-4854kcals/d  Protein:  Using IBW of 50kg. (1.0-1.2 g/kg) 50-60 g/d  Fluid:  Using IBW of 50kg (30-388mkg) 1500-175084m  EDUCATION NEEDS:   No education needs identified at this time  MODERATE Care Level  Lori Mckenzie B. AllZenia ResidesD,[place])

## 2014-08-29 NOTE — Progress Notes (Signed)
CC: Abdominal pain Subjective: Abdominal pain slightly improved patient up and walking and taking less narcotics. Nausea or vomiting. Completed CT scan today.  Objective: Vital signs in last 24 hours: Temp:  [97.9 F (36.6 C)-98.3 F (36.8 C)] 97.9 F (36.6 C) (08/26 0834) Pulse Rate:  [86-91] 86 (08/26 0834) Resp:  [16-18] 18 (08/26 0834) BP: (90-104)/(57-65) 104/65 mmHg (08/26 0834) SpO2:  [97 %-99 %] 97 % (08/26 0834) Last BM Date: 08/27/14  Intake/Output from previous day: 08/25 0701 - 08/26 0700 In: 483 [P.O.:480; I.V.:3] Out: 600 [Urine:600] Intake/Output this shift:    Physical exam:  Distended abdomen soft and nontender nontender calves  Lab Results: CBC   Recent Labs  08/28/14 0513  WBC 6.7  HGB 9.2*  HCT 27.4*  PLT 251   BMET No results for input(s): NA, K, CL, CO2, GLUCOSE, BUN, CREATININE, CALCIUM in the last 72 hours. PT/INR No results for input(s): LABPROT, INR in the last 72 hours. ABG No results for input(s): PHART, HCO3 in the last 72 hours.  Invalid input(s): PCO2, PO2  Studies/Results: Ct Abdomen Pelvis W Contrast  08/29/2014   CLINICAL DATA:  49 year old female with increased abdominal pain and distension status post cholecystectomy in July. Stent placement for bile leak subsequent. Gallbladder fossa abscess drained earlier this month. Subsequent encounter.  EXAM: CT ABDOMEN AND PELVIS WITH CONTRAST  TECHNIQUE: Multidetector CT imaging of the abdomen and pelvis was performed using the standard protocol following bolus administration of intravenous contrast.  CONTRAST:  144m OMNIPAQUE IOHEXOL 300 MG/ML  SOLN  COMPARISON:  08/25/2014 and earlier  FINDINGS: Stable mild cardiomegaly. Layering small left pleural effusion has not significantly changed. Mild dependent lung base atelectasis is stable.  Degenerative osseous changes in the spine. No acute osseous abnormality identified.  Negative rectum mildly distended with gas. Mildly distended but  otherwise negative urinary bladder.  Moderate pelvic and abdominal ascites is stable from 4 days ago. As noted at that time, this is partially loculated, for example with mild enhancing septations noted about the spleen.  Oral contrast mixed with stool in the sigmoid colon which is redundant. Retained stool throughout the more proximal colon. Most small bowel is decompressed in the abdomen. The stomach is decompressed. There is a biliary stent terminating in the duodenum. The fourth portion of the duodenum and ligament of Treitz along with a few proximal small bowel loops are dilated 3.5-4 cm diameter. There is been a somewhat gradual transition to nondilated loops. No pneumoperitoneum.  Stable liver. Residual mixed density collection in the gallbladder fossa has regressed, now 25 mm diameter (previously 30-31 mm). The proximal CBD and central bile ducts are prominent but stable. Stable splenomegaly. The portal venous system is patent.  Negative pancreas, no main pancreatic duct dilatation. Negative adrenal glands. Renal contrast enhancement and excretion is symmetric and within normal limits. Major arterial structures are patent. No lymphadenopathy.  Mildly increased subcutaneous fat stranding along the panniculus.  IMPRESSION: 1. Smaller mixed density fluid collection in the gallbladder fossa since 4 days ago following percutaneous drain removal. No evidence of abscess recurrence at this time. 2. Interval mildly dilated ligament of Treitz and proximal small bowel loops, but no transition point or obstructing lesion. Favor ileus. 3. Otherwise stable abdomen and pelvis since 08/25/2014, including moderate volume loculated ascites which might be the sequelae of bile peritonitis. 4. Stable small left pleural effusion.   Electronically Signed   By: HGenevie AnnM.D.   On: 08/29/2014 09:34    Anti-infectives: Anti-infectives  Start     Dose/Rate Route Frequency Ordered Stop   08/25/14 2200  piperacillin-tazobactam  (ZOSYN) IVPB 3.375 g     3.375 g 12.5 mL/hr over 240 Minutes Intravenous 3 times per day 08/25/14 1823     08/25/14 1515  vancomycin (VANCOCIN) IVPB 1000 mg/200 mL premix     1,000 mg 200 mL/hr over 60 Minutes Intravenous  Once 08/25/14 1503 08/25/14 1708   08/25/14 1515  piperacillin-tazobactam (ZOSYN) IVPB 3.375 g     3.375 g 12.5 mL/hr over 240 Minutes Intravenous  Once 08/25/14 1503 08/25/14 1709      Assessment/Plan:  CT scan personally reviewed improvement and no sign of abscess but ascites present. I discussed with the patient the fact that she is improved and there is no sign of infection suggest that we can discharge her possibly tomorrow on oral antibiotic's. I would not perform a paracentesis due to the risk of infecting this currently uninfected ascites she and her husband understood and agreed with this plan  Florene Glen, MD, FACS  08/29/2014

## 2014-08-30 LAB — CULTURE, BLOOD (ROUTINE X 2)
Culture: NO GROWTH
Culture: NO GROWTH

## 2014-08-30 MED ORDER — OXYCODONE-ACETAMINOPHEN 5-325 MG PO TABS: 1.0000 | ORAL_TABLET | Freq: Four times a day (QID) | ORAL | Status: DC | PRN

## 2014-08-30 MED ORDER — AMOXICILLIN-POT CLAVULANATE 875-125 MG PO TABS
1.0000 | ORAL_TABLET | Freq: Two times a day (BID) | ORAL | Status: DC
Start: 2014-08-30 — End: 2014-09-08

## 2014-08-30 NOTE — Discharge Summary (Signed)
Physician Discharge Summary  Patient ID: Lori Mckenzie MRN: [id number] DOB/AGE: 1965-03-28 49 y.o.  Admit date: 08/25/2014 Discharge date: 08/30/2014   Discharge Diagnoses:  Active Problems:   Abdominal abscess   Procedures: None  Hospital Course: This patient was mated to the hospital with abdominal pain following treatment for a biliary fistula which has resolved. She had had a fever as an outpatient which was never documented in the hospital. And her white blood cell count remained normal. Domino exam remained fairly benign. But she continued to ask for considerable narcotics and on frequent occasions she was so heavily dictated that she could not keep her eyes open nor speak coherently. Also there was question about her family members utilizing multiple medications while visiting as well. At one point the nursing staff removed a standard shopping bag full of narcotics and benzodiazepine's. Early the patient has no sign of infection but will be sent home on oral Augmentin as she was on that prehospital. She will follow-up with Dr. Marina Gravel in 10 days. Consults: None  Disposition: 01-Home or Self Care     Medication List    TAKE these medications        albuterol 108 (90 BASE) MCG/ACT inhaler  Commonly known as:  PROVENTIL HFA;VENTOLIN HFA  Inhale 2 puffs into the lungs every 4 (four) hours as needed for wheezing or shortness of breath.     ALPRAZolam 1 MG tablet  Commonly known as:  XANAX  Take 1 tablet (1 mg total) by mouth 3 (three) times daily as needed for anxiety.     amoxicillin-clavulanate 875-125 MG per tablet  Commonly known as:  AUGMENTIN  Take 1 tablet by mouth 2 (two) times daily.     aspirin 81 MG EC tablet  Take 1 tablet (81 mg total) by mouth daily.     citalopram 20 MG tablet  Commonly known as:  CELEXA  Take 20 mg by mouth at bedtime.     clotrimazole 10 MG troche  Commonly known as:  MYCELEX  Take 10 mg by mouth 4 (four) times daily as needed (for dry  mouth).     fluticasone 50 MCG/ACT nasal spray  Commonly known as:  FLONASE  Place 1 spray into both nostrils daily as needed for rhinitis.     furosemide 40 MG tablet  Commonly known as:  LASIX  Take 80 mg by mouth 2 (two) times daily.     gabapentin 300 MG capsule  Commonly known as:  NEURONTIN  Take 300 mg by mouth 4 (four) times daily as needed (for nerve pain).     hydrochlorothiazide 25 MG tablet  Commonly known as:  HYDRODIURIL  Take 12.5 mg by mouth daily as needed (when diastolic BP is greater than 60).     ipratropium 17 MCG/ACT inhaler  Commonly known as:  ATROVENT HFA  Inhale 2 puffs into the lungs every 8 (eight) hours.     metFORMIN 1000 MG tablet  Commonly known as:  GLUCOPHAGE  Take 500-1,000 mg by mouth 2 (two) times daily as needed (for high blood sugar).     omeprazole 20 MG capsule  Commonly known as:  PRILOSEC  Take 40 mg by mouth 2 (two) times daily as needed (for acid reflux).     oxyCODONE-acetaminophen 5-325 MG per tablet  Commonly known as:  PERCOCET/ROXICET  Take 1 tablet by mouth every 6 (six) hours as needed for severe pain.     rizatriptan 10 MG tablet  Commonly known as:  MAXALT  Take 10 mg by mouth as needed for migraine.     ROBAXIN 500 MG tablet  Generic drug:  methocarbamol  Take 500 mg by mouth 2 (two) times daily as needed for muscle spasms.           Follow-up Information    Follow up with Sherri Rad, MD In 2 weeks.   Specialties:  Surgery, Radiology   Why:  follow up   Contact information:   [address] [place] [address] [phone]       Florene Glen, MD, FACS

## 2014-08-30 NOTE — Progress Notes (Signed)
Patient discharged to home as ordered. Discharge instructions given to patient and patient son at the bedside. Patient is alert and oriented to self and place. Patient able to ambulate without assistance. Patient given patient medication from pharmacy. IV discontinued to left arm site without S/S of infiltration or infection. No acute distress noted.

## 2014-08-30 NOTE — Progress Notes (Signed)
Subjective: Lori Mckenzie pain is improved no nausea or vomiting patient wants to go home today.  Objective: Vital signs in last 24 hours: Temp:  [97.9 F (36.6 C)-98.2 F (36.8 C)] 97.9 F (36.6 C) (08/27 0818) Pulse Rate:  [87-112] 87 (08/27 0818) Resp:  [16-17] 16 (08/27 0818) BP: (85-111)/(56-97) 85/56 mmHg (08/27 0818) SpO2:  [90 %-96 %] 95 % (08/27 0818) Last BM Date: 08/28/14  Intake/Output from previous day: 08/26 0701 - 08/27 0700 In: 50 [P.O.:50] Out: 0  Intake/Output this shift:    Physical exam:  Abdomen is soft nontender no peritoneal signs wounds are well-healed. Calves are nontender  Lab Results: CBC   Recent Labs  08/28/14 0513  WBC 6.7  HGB 9.2*  HCT 27.4*  PLT 251   BMET No results for input(s): NA, K, CL, CO2, GLUCOSE, BUN, CREATININE, CALCIUM in the last 72 hours. PT/INR No results for input(s): LABPROT, INR in the last 72 hours. ABG No results for input(s): PHART, HCO3 in the last 72 hours.  Invalid input(s): PCO2, PO2  Studies/Results: Ct Abdomen Pelvis W Contrast  08/29/2014   CLINICAL DATA:  49 year old female with increased abdominal pain and distension status post cholecystectomy in July. Stent placement for bile leak subsequent. Gallbladder fossa abscess drained earlier this month. Subsequent encounter.  EXAM: CT ABDOMEN AND PELVIS WITH CONTRAST  TECHNIQUE: Multidetector CT imaging of the abdomen and pelvis was performed using the standard protocol following bolus administration of intravenous contrast.  CONTRAST:  170m OMNIPAQUE IOHEXOL 300 MG/ML  SOLN  COMPARISON:  08/25/2014 and earlier  FINDINGS: Stable mild cardiomegaly. Layering small left pleural effusion has not significantly changed. Mild dependent lung base atelectasis is stable.  Degenerative osseous changes in the spine. No acute osseous abnormality identified.  Negative rectum mildly distended with gas. Mildly distended but otherwise negative urinary bladder.  Moderate pelvic and  abdominal ascites is stable from 4 days ago. As noted at that time, this is partially loculated, for example with mild enhancing septations noted about the spleen.  Oral contrast mixed with stool in the sigmoid colon which is redundant. Retained stool throughout the more proximal colon. Most small bowel is decompressed in the abdomen. The stomach is decompressed. There is a biliary stent terminating in the duodenum. The fourth portion of the duodenum and ligament of Treitz along with a few proximal small bowel loops are dilated 3.5-4 cm diameter. There is been a somewhat gradual transition to nondilated loops. No pneumoperitoneum.  Stable liver. Residual mixed density collection in the gallbladder fossa has regressed, now 25 mm diameter (previously 30-31 mm). The proximal CBD and central bile ducts are prominent but stable. Stable splenomegaly. The portal venous system is patent.  Negative pancreas, no main pancreatic duct dilatation. Negative adrenal glands. Renal contrast enhancement and excretion is symmetric and within normal limits. Major arterial structures are patent. No lymphadenopathy.  Mildly increased subcutaneous fat stranding along the panniculus.  IMPRESSION: 1. Smaller mixed density fluid collection in the gallbladder fossa since 4 days ago following percutaneous drain removal. No evidence of abscess recurrence at this time. 2. Interval mildly dilated ligament of Treitz and proximal small bowel loops, but no transition point or obstructing lesion. Favor ileus. 3. Otherwise stable abdomen and pelvis since 08/25/2014, including moderate volume loculated ascites which might be the sequelae of bile peritonitis. 4. Stable small left pleural effusion.   Electronically Signed   By: HGenevie AnnM.D.   On: 08/29/2014 09:34    Anti-infectives: Anti-infectives  Start     Dose/Rate Route Frequency Ordered Stop   08/25/14 2200  piperacillin-tazobactam (ZOSYN) IVPB 3.375 g     3.375 g 12.5 mL/hr over 240  Minutes Intravenous 3 times per day 08/25/14 1823     08/25/14 1515  vancomycin (VANCOCIN) IVPB 1000 mg/200 mL premix     1,000 mg 200 mL/hr over 60 Minutes Intravenous  Once 08/25/14 1503 08/25/14 1708   08/25/14 1515  piperacillin-tazobactam (ZOSYN) IVPB 3.375 g     3.375 g 12.5 mL/hr over 240 Minutes Intravenous  Once 08/25/14 1503 08/25/14 1709      Assessment/Plan: s/p    Patient doing well we can transition to the oral antibiotic's that she was on prehospital which was Augmentin. And discharge her today in follow-up with Dr. Marina Gravel in 2 weeks.  Florene Glen, MD, FACS  08/30/2014

## 2014-08-30 NOTE — Discharge Instructions (Signed)
Resume normal activities and all medications as prescribed. Follow-up with Dr. Marina Gravel in 2 weeks. Return if pain worsens or fever occurs. Take all meds as prescribed.  Notify MD for any questions or concerns.

## 2014-09-02 ENCOUNTER — Encounter: Payer: Self-pay | Admitting: Surgery

## 2014-09-02 ENCOUNTER — Ambulatory Visit (INDEPENDENT_AMBULATORY_CARE_PROVIDER_SITE_OTHER): Payer: Medicaid Other | Admitting: Surgery

## 2014-09-02 ENCOUNTER — Telehealth: Payer: Self-pay

## 2014-09-02 VITALS — BP 125/90 | HR 97 | Temp 97.9°F | Ht 62.0 in | Wt 242.0 lb

## 2014-09-02 DIAGNOSIS — K7581 Nonalcoholic steatohepatitis (NASH): Secondary | ICD-10-CM

## 2014-09-02 DIAGNOSIS — K7460 Unspecified cirrhosis of liver: Secondary | ICD-10-CM

## 2014-09-02 MED ORDER — OXYCODONE-ACETAMINOPHEN 5-325 MG PO TABS: 2.0000 | ORAL_TABLET | ORAL | Status: DC | PRN

## 2014-09-02 NOTE — Telephone Encounter (Signed)
Spoke with Dr. Pat Patrick and he would like for patient to continue with appointment today to address pain.  Phone call returned to patient at this time. She will keep today's appointment.

## 2014-09-02 NOTE — Patient Instructions (Signed)
You are scheduled for a ultasound of the abdomen limited at [facility] on 09/03/14 arrive at 0830 AM. Follow up appointment scheduling will depend upon ultrasound results.

## 2014-09-02 NOTE — Telephone Encounter (Signed)
Called patient to push appointment out as she has just been discharged 3 days ago and it has not been enough time for follow-up appointment from hospitalization.  Patient states that she saw PCP yesterday and was told to follow-up with [place] today to get her pain under control. Explained that I would speak with Dr. Pat Patrick and get back to her.

## 2014-09-02 NOTE — Progress Notes (Signed)
Outpatient Surgical Follow Up  09/02/2014  Lori Mckenzie is an 49 y.o. female.   Chief Complaint  Patient presents with  . Follow-up    Abdominal Abscess - Post Cholecystectomy    HPI: She returns today complaining of more abdominal discomfort abdominal distention shortness of breath. She's had a very complicated 70 last several weeks. She underwent cholecystectomy in early July complicated by a bile leak. She required percutaneous drainage and a stent. She had lots of problems with pain medicine and pain control has been hospitalized several times for recurrent problems. She's been overmedicated a couple of times when I evaluated her at the hospital.  She was in the hospital last week discharge with an abdominal binder and was doing relatively well for a few days. However the last couple days she's noted increasing shortness of breath more abdominal distention and more significant abdominal pain. She's been on chronic O2 supplemental therapy for some time and does not see her pulmonologist in over a year. Chest x-rays were largely unremarkable toward the end of her last hospitalization. She's not had a recent abdominal ultrasound and did have significant ascitic leakage when she had her drain removed.  Past Medical History  Diagnosis Date  . Brittle bone disease   . Asthma   . Thyroid disease   . Hypertension   . Back pain   . Diabetes mellitus without complication   . Chronic kidney disease   . Anxiety   . GERD (gastroesophageal reflux disease)   . Sleep apnea   . COPD (chronic obstructive pulmonary disease)   . Hypothyroidism   . Cervical disc disease   . TIA (transient ischemic attack)   . Cancer     Uteriine  ca 40yr ago partial hysterectomy  . Collagen vascular disease     RA  3-4 yrs ago    Past Surgical History  Procedure Laterality Date  . Abdominal hysterectomy    . Tubal ligation    . Colonoscopy with propofol N/A 06/23/2014    Procedure: COLONOSCOPY WITH PROPOFOL;   Surgeon: MLollie Sails MD;  [facility];  Service: Endoscopy;  Laterality: N/A;  . Esophagogastroduodenoscopy N/A 06/23/2014    Procedure: ESOPHAGOGASTRODUODENOSCOPY (EGD);  Surgeon: MLollie Sails MD;  [facility];  Service: Endoscopy;  Laterality: N/A;  . Cholecystectomy N/A 07/15/2014    Procedure: LAPAROSCOPIC CHOLECYSTECTOMY with liver biopsy ;  Surgeon: MSherri Rad MD;  Location: [facility] ORS;  Service: General;  Laterality: N/A;  . Ercp N/A 07/16/2014    Procedure: ENDOSCOPIC RETROGRADE CHOLANGIOPANCREATOGRAPHY (ERCP);  Surgeon: MClarene Essex MD;  [facility];  Service: Endoscopy;  Laterality: N/A;    Family History  Problem Relation Age of Onset  . Lung cancer Father   . Ulcers Father   . Heart disease Sister   . Ulcers Sister   . Heart disease Brother     Social History:  reports that she has never smoked. She has never used smokeless tobacco. She reports that she does not drink alcohol or use illicit drugs.  Allergies:  Allergies  Allergen Reactions  . Vicodin [Hydrocodone-Acetaminophen] Itching and Rash    Medications reviewed.    ROS    BP 125/90 mmHg  Pulse 97  Temp(Src) 97.9 F (36.6 C) (Oral)  Ht 5' 2"  (1.575 m)  Wt 242 lb (109.77 kg)  BMI 44.25 kg/m2  SpO2 93%  Physical Exam she is mildly distended and tender without any point tenderness or abdominal rebound. She does have active  bowel sounds.     No results found for this or any previous visit (from the past 48 hour(s)). No results found.  Assessment/Plan:  1. Liver cirrhosis secondary to NASH This wound presents very difficult problem. She has significant pain management issues and has been referred to the pain Center but returns frequently for refills on her pain medication. That appears to be her major goal today. However, she does appear short of breath at rest she does have more abdominal distention. She has known nonalcoholic steatohepatitis in her  symptoms may be from increasing ascitic fluid in her abdomen. I certainly can't tell from physical examination. We will set her up for an ultrasound to be certain that she has not had increasing abdominal ascites. She may need to be considered for a paracentesis. We we will refill her pain medicine prescription today. - US Abdomen Limited; Future     Dia Crawford III  09/02/2014,negative

## 2014-09-03 ENCOUNTER — Telehealth: Payer: Self-pay

## 2014-09-03 ENCOUNTER — Ambulatory Visit
Admission: RE | Admit: 2014-09-03 | Discharge: 2014-09-03 | Disposition: A | Discharge status: Home / Self Care | Payer: Medicaid Other | Source: Ambulatory Visit | Attending: Surgery | Admitting: Surgery

## 2014-09-03 DIAGNOSIS — K7581 Nonalcoholic steatohepatitis (NASH): Secondary | ICD-10-CM | POA: Diagnosis present

## 2014-09-03 DIAGNOSIS — R188 Other ascites: Secondary | ICD-10-CM | POA: Insufficient documentation

## 2014-09-03 DIAGNOSIS — K7460 Unspecified cirrhosis of liver: Secondary | ICD-10-CM

## 2014-09-03 NOTE — Telephone Encounter (Addendum)
Orders placed for paracentesis. Call made to patient to explain what needs to be done and why. Answered all questions to patient's satisfaction.   Spoke with Colletta Maryland in Ultrasound at this time. Patient scheduled for 9/2 at 63. Needs to arrive at [facility] desk by 11am.  Do not take aspirin tomorrow morning. NPO after midnight before procedure.

## 2014-09-03 NOTE — Telephone Encounter (Signed)
-----   Message from Royal, MD sent at 09/03/2014  4:15 PM EDT ----- She needs a paracentesis under US guidance.  I have looked at CT and [place].   Not an emergency. ----- Message -----    From: Reece Packer, RN    Sent: 09/03/2014   9:53 AM      To: Dia Crawford III, MD  Ultrasound from this am states that Ascites Volume is similar to previous ultrasound. On previous CT, they note this amount to be "moderate."  What would you like follow-up to be?

## 2014-09-04 ENCOUNTER — Emergency Department: Payer: Medicaid Other

## 2014-09-04 ENCOUNTER — Encounter: Payer: Self-pay | Admitting: Emergency Medicine

## 2014-09-04 ENCOUNTER — Telehealth: Payer: Self-pay

## 2014-09-04 ENCOUNTER — Observation Stay
Admission: EM | Admit: 2014-09-04 | Discharge: 2014-09-08 | Disposition: A | Discharge status: Home / Self Care | Payer: Medicaid Other | Attending: Internal Medicine | Admitting: Internal Medicine

## 2014-09-04 ENCOUNTER — Other Ambulatory Visit: Payer: Self-pay | Admitting: Physician Assistant

## 2014-09-04 DIAGNOSIS — R109 Unspecified abdominal pain: Secondary | ICD-10-CM | POA: Diagnosis present

## 2014-09-04 DIAGNOSIS — R1084 Generalized abdominal pain: Principal | ICD-10-CM | POA: Insufficient documentation

## 2014-09-04 DIAGNOSIS — I129 Hypertensive chronic kidney disease with stage 1 through stage 4 chronic kidney disease, or unspecified chronic kidney disease: Secondary | ICD-10-CM | POA: Insufficient documentation

## 2014-09-04 DIAGNOSIS — Z90710 Acquired absence of both cervix and uterus: Secondary | ICD-10-CM | POA: Diagnosis not present

## 2014-09-04 DIAGNOSIS — E119 Type 2 diabetes mellitus without complications: Secondary | ICD-10-CM

## 2014-09-04 DIAGNOSIS — G8929 Other chronic pain: Secondary | ICD-10-CM | POA: Diagnosis not present

## 2014-09-04 DIAGNOSIS — J45909 Unspecified asthma, uncomplicated: Secondary | ICD-10-CM | POA: Insufficient documentation

## 2014-09-04 DIAGNOSIS — Z9989 Dependence on other enabling machines and devices: Secondary | ICD-10-CM

## 2014-09-04 DIAGNOSIS — I959 Hypotension, unspecified: Secondary | ICD-10-CM | POA: Diagnosis not present

## 2014-09-04 DIAGNOSIS — R300 Dysuria: Secondary | ICD-10-CM | POA: Diagnosis not present

## 2014-09-04 DIAGNOSIS — K7581 Nonalcoholic steatohepatitis (NASH): Secondary | ICD-10-CM | POA: Insufficient documentation

## 2014-09-04 DIAGNOSIS — F341 Dysthymic disorder: Secondary | ICD-10-CM | POA: Insufficient documentation

## 2014-09-04 DIAGNOSIS — Z8542 Personal history of malignant neoplasm of other parts of uterus: Secondary | ICD-10-CM | POA: Diagnosis not present

## 2014-09-04 DIAGNOSIS — K650 Generalized (acute) peritonitis: Secondary | ICD-10-CM | POA: Insufficient documentation

## 2014-09-04 DIAGNOSIS — K219 Gastro-esophageal reflux disease without esophagitis: Secondary | ICD-10-CM | POA: Insufficient documentation

## 2014-09-04 DIAGNOSIS — E876 Hypokalemia: Secondary | ICD-10-CM | POA: Diagnosis not present

## 2014-09-04 DIAGNOSIS — K7689 Other specified diseases of liver: Secondary | ICD-10-CM | POA: Diagnosis not present

## 2014-09-04 DIAGNOSIS — E039 Hypothyroidism, unspecified: Secondary | ICD-10-CM | POA: Insufficient documentation

## 2014-09-04 DIAGNOSIS — Z9049 Acquired absence of other specified parts of digestive tract: Secondary | ICD-10-CM | POA: Diagnosis not present

## 2014-09-04 DIAGNOSIS — Z79899 Other long term (current) drug therapy: Secondary | ICD-10-CM | POA: Insufficient documentation

## 2014-09-04 DIAGNOSIS — R112 Nausea with vomiting, unspecified: Secondary | ICD-10-CM | POA: Diagnosis not present

## 2014-09-04 DIAGNOSIS — E1122 Type 2 diabetes mellitus with diabetic chronic kidney disease: Secondary | ICD-10-CM | POA: Diagnosis not present

## 2014-09-04 DIAGNOSIS — Z794 Long term (current) use of insulin: Secondary | ICD-10-CM | POA: Insufficient documentation

## 2014-09-04 DIAGNOSIS — Z7951 Long term (current) use of inhaled steroids: Secondary | ICD-10-CM | POA: Diagnosis not present

## 2014-09-04 DIAGNOSIS — R161 Splenomegaly, not elsewhere classified: Secondary | ICD-10-CM | POA: Diagnosis not present

## 2014-09-04 DIAGNOSIS — Z8673 Personal history of transient ischemic attack (TIA), and cerebral infarction without residual deficits: Secondary | ICD-10-CM | POA: Diagnosis not present

## 2014-09-04 DIAGNOSIS — Z7982 Long term (current) use of aspirin: Secondary | ICD-10-CM | POA: Insufficient documentation

## 2014-09-04 DIAGNOSIS — F419 Anxiety disorder, unspecified: Secondary | ICD-10-CM | POA: Diagnosis not present

## 2014-09-04 DIAGNOSIS — L02211 Cutaneous abscess of abdominal wall: Secondary | ICD-10-CM | POA: Diagnosis not present

## 2014-09-04 DIAGNOSIS — Z801 Family history of malignant neoplasm of trachea, bronchus and lung: Secondary | ICD-10-CM | POA: Insufficient documentation

## 2014-09-04 DIAGNOSIS — N189 Chronic kidney disease, unspecified: Secondary | ICD-10-CM | POA: Diagnosis not present

## 2014-09-04 DIAGNOSIS — Z885 Allergy status to narcotic agent status: Secondary | ICD-10-CM | POA: Diagnosis not present

## 2014-09-04 DIAGNOSIS — J449 Chronic obstructive pulmonary disease, unspecified: Secondary | ICD-10-CM | POA: Insufficient documentation

## 2014-09-04 DIAGNOSIS — R188 Other ascites: Secondary | ICD-10-CM | POA: Diagnosis present

## 2014-09-04 DIAGNOSIS — M25511 Pain in right shoulder: Secondary | ICD-10-CM | POA: Insufficient documentation

## 2014-09-04 DIAGNOSIS — Z8489 Family history of other specified conditions: Secondary | ICD-10-CM | POA: Insufficient documentation

## 2014-09-04 DIAGNOSIS — R140 Abdominal distension (gaseous): Secondary | ICD-10-CM | POA: Diagnosis not present

## 2014-09-04 DIAGNOSIS — R509 Fever, unspecified: Secondary | ICD-10-CM | POA: Insufficient documentation

## 2014-09-04 DIAGNOSIS — G4733 Obstructive sleep apnea (adult) (pediatric): Secondary | ICD-10-CM | POA: Diagnosis not present

## 2014-09-04 DIAGNOSIS — Z8249 Family history of ischemic heart disease and other diseases of the circulatory system: Secondary | ICD-10-CM | POA: Insufficient documentation

## 2014-09-04 DIAGNOSIS — R918 Other nonspecific abnormal finding of lung field: Secondary | ICD-10-CM | POA: Insufficient documentation

## 2014-09-04 DIAGNOSIS — J9811 Atelectasis: Secondary | ICD-10-CM | POA: Insufficient documentation

## 2014-09-04 DIAGNOSIS — J441 Chronic obstructive pulmonary disease with (acute) exacerbation: Secondary | ICD-10-CM | POA: Diagnosis present

## 2014-09-04 HISTORY — DX: Other ascites: R18.8

## 2014-09-04 HISTORY — DX: Nonalcoholic steatohepatitis (NASH): K75.81

## 2014-09-04 LAB — CBC WITH DIFFERENTIAL/PLATELET
Basophils Absolute: 0.1 10*3/uL (ref 0–0.1)
Basophils Relative: 1 %
Eosinophils Absolute: 0.1 10*3/uL (ref 0–0.7)
Eosinophils Relative: 1 %
HCT: 33.6 % — ABNORMAL LOW (ref 35.0–47.0)
Hemoglobin: 11.1 g/dL — ABNORMAL LOW (ref 12.0–16.0)
Lymphocytes Relative: 24 %
Lymphs Abs: 1.7 10*3/uL (ref 1.0–3.6)
MCH: 29.5 pg (ref 26.0–34.0)
MCHC: 33 g/dL (ref 32.0–36.0)
MCV: 89.6 fL (ref 80.0–100.0)
Monocytes Absolute: 0.5 10*3/uL (ref 0.2–0.9)
Monocytes Relative: 7 %
Neutro Abs: 5 10*3/uL (ref 1.4–6.5)
Neutrophils Relative %: 67 %
Platelets: 210 10*3/uL (ref 150–440)
RBC: 3.75 MIL/uL — ABNORMAL LOW (ref 3.80–5.20)
RDW: 16.1 % — ABNORMAL HIGH (ref 11.5–14.5)
WBC: 7.4 10*3/uL (ref 3.6–11.0)

## 2014-09-04 LAB — URINALYSIS COMPLETE WITH MICROSCOPIC (ARMC ONLY)
Glucose, UA: NEGATIVE mg/dL
Nitrite: NEGATIVE
Protein, ur: 100 mg/dL — AB
RBC / HPF: NONE SEEN RBC/hpf (ref 0–5)
Specific Gravity, Urine: 1.032 — ABNORMAL HIGH (ref 1.005–1.030)
pH: 6 (ref 5.0–8.0)

## 2014-09-04 LAB — COMPREHENSIVE METABOLIC PANEL
ALT: 12 U/L — ABNORMAL LOW (ref 14–54)
AST: 34 U/L (ref 15–41)
Albumin: 2.9 g/dL — ABNORMAL LOW (ref 3.5–5.0)
Alkaline Phosphatase: 54 U/L (ref 38–126)
Anion gap: 12 (ref 5–15)
BUN: 8 mg/dL (ref 6–20)
CO2: 30 mmol/L (ref 22–32)
Calcium: 8.6 mg/dL — ABNORMAL LOW (ref 8.9–10.3)
Chloride: 96 mmol/L — ABNORMAL LOW (ref 101–111)
Creatinine, Ser: 0.62 mg/dL (ref 0.44–1.00)
GFR calc Af Amer: >60 mL/min (ref >60)
GFR calc non Af Amer: >60 mL/min (ref >60)
Glucose, Bld: 111 mg/dL — ABNORMAL HIGH (ref 65–99)
Potassium: 2.6 mmol/L — CL (ref 3.5–5.1)
Sodium: 138 mmol/L (ref 135–145)
Total Bilirubin: 0.8 mg/dL (ref 0.3–1.2)
Total Protein: 7.6 g/dL (ref 6.5–8.1)

## 2014-09-04 LAB — TROPONIN I: Troponin I: <0.03 ng/mL (ref <0.031)

## 2014-09-04 LAB — LIPASE, BLOOD: Lipase: 30 U/L (ref 22–51)

## 2014-09-04 LAB — LACTIC ACID, PLASMA: Lactic Acid, Venous: 1.9 mmol/L (ref 0.5–2.0)

## 2014-09-04 MED ORDER — POTASSIUM CHLORIDE 10 MEQ/100ML IV SOLN
10.0000 meq | Freq: Once | INTRAVENOUS | Status: AC
  Administered 2014-09-04: 10 meq via INTRAVENOUS
  Filled 2014-09-04: qty 100

## 2014-09-04 MED ORDER — ONDANSETRON HCL 4 MG/2ML IJ SOLN
INTRAMUSCULAR | Status: AC
  Administered 2014-09-04: 4 mg via INTRAVENOUS
  Filled 2014-09-04: qty 2

## 2014-09-04 MED ORDER — MORPHINE SULFATE (PF) 4 MG/ML IV SOLN
4.0000 mg | Freq: Once | INTRAVENOUS | Status: AC
  Administered 2014-09-04: 4 mg via INTRAVENOUS
  Filled 2014-09-04: qty 1

## 2014-09-04 MED ORDER — MORPHINE SULFATE (PF) 4 MG/ML IV SOLN
INTRAVENOUS | Status: AC
  Administered 2014-09-04: 4 mg via INTRAVENOUS
  Filled 2014-09-04: qty 1

## 2014-09-04 MED ORDER — IOHEXOL 240 MG/ML SOLN
50.0000 mL | Freq: Once | INTRAMUSCULAR | Status: AC | PRN
  Administered 2014-09-04: 50 mL via ORAL

## 2014-09-04 MED ORDER — ONDANSETRON HCL 4 MG/2ML IJ SOLN
4.0000 mg | Freq: Once | INTRAMUSCULAR | Status: AC
  Administered 2014-09-04: 4 mg via INTRAVENOUS

## 2014-09-04 MED ORDER — MORPHINE SULFATE (PF) 4 MG/ML IV SOLN
4.0000 mg | Freq: Once | INTRAVENOUS | Status: AC
  Administered 2014-09-04: 4 mg via INTRAVENOUS

## 2014-09-04 MED ORDER — POTASSIUM CHLORIDE 10 MEQ/100ML IV SOLN
10.0000 meq | Freq: Once | INTRAVENOUS | Status: AC
  Administered 2014-09-04: 10 meq via INTRAVENOUS
  Filled 2014-09-04 (×2): qty 100

## 2014-09-04 MED ORDER — IOHEXOL 300 MG/ML  SOLN
100.0000 mL | Freq: Once | INTRAMUSCULAR | Status: AC | PRN
  Administered 2014-09-04: 100 mL via INTRAVENOUS

## 2014-09-04 MED ORDER — SODIUM CHLORIDE 0.9 % IV BOLUS (SEPSIS)
1000.0000 mL | Freq: Once | INTRAVENOUS | Status: AC
  Administered 2014-09-04: 1000 mL via INTRAVENOUS

## 2014-09-04 NOTE — ED Notes (Signed)
Pt is post cholecystectomy c/o "not feeling well" pt is scheduled for follow up tomorrow for fluid build up in generalized abdomen.

## 2014-09-04 NOTE — Telephone Encounter (Signed)
Called patient at this time to let her know of appointment for Paracentesis tomorrow. NPO after midnight and do not take aspirin tomorrow.  Information given to patient. Readback completed 3 times as she was having trouble remembering. I asked patient to write this information down.   She states that she will be there.

## 2014-09-04 NOTE — Telephone Encounter (Signed)
Spoke with patient this morning and she was asking about follow-up after Paracentesis.   Spoke with Dr. Pat Patrick regarding this and patient should follow-up with her regular GI specialist for continued Liver Care.  [facility] GI department as patient normally sees Stephens November, NP according to previous notes. She is scheduled to follow-up in their office on 9/29 at 1430, pt needs to arrive at 1415. However, she will be placed on a cancellation waiting list as HIGH priority to be fit in sooner.

## 2014-09-04 NOTE — Telephone Encounter (Signed)
I have called patient and let her know of this information. She verbalizes understanding and will wait to be contacted by pain management.

## 2014-09-04 NOTE — Telephone Encounter (Signed)
-----   Message from Mickie Kay sent at 09/04/2014  9:35 AM EDT ----- I have called down the the Pain management clinic and spoke with Angie. She stated that she will contact the pt and schedule the appointment--this will probably be 2-3 weeks out.  ----- Message -----    From: Reece Packer, RN    Sent: 09/03/2014   9:55 AM      To: Albin Felling Brouillard  Can we follow-up on Referral to pain management. Pt seen in office yesterday and says that no one has called her.

## 2014-09-04 NOTE — ED Notes (Signed)
Pt ambulatory to toilet to attempt to give urine sample

## 2014-09-04 NOTE — ED Notes (Addendum)
Pt reports gall bladder surgery 7/9 with complications.  PT reports abdominal swelling.  Pt scheduled for followup tomorrow.  Pt reports some SOB, fever, abd tenderness.

## 2014-09-04 NOTE — ED Provider Notes (Addendum)
[facility] Emergency Department Provider Note  ____________________________________________  Time seen: Approximately 9:35 PM  I have reviewed the triage vital signs and the nursing notes.   HISTORY  Chief Complaint Post-op Problem   HPI Lori Mckenzie is a 49 y.o. female who had a cholecystectomy and then developed a bile leak. On the 22nd she had an ultrasound which showed a pocket of fluid in her belly had a paracentesis which removes some fluid. Since then she has began having increasing abdominal girth again and increasing weight gain again and today developed a fever up to 101.4 at home. She complains of diffuse abdominal pain which is worse to palpation percussion or movement. The pain is moderately severe. He in nature.  Past Medical History  Diagnosis Date  . Brittle bone disease   . Asthma   . Thyroid disease   . Hypertension   . Back pain   . Diabetes mellitus without complication   . Chronic kidney disease   . Anxiety   . GERD (gastroesophageal reflux disease)   . Sleep apnea   . COPD (chronic obstructive pulmonary disease)   . Hypothyroidism   . Cervical disc disease   . TIA (transient ischemic attack)   . Cancer     Uteriine  ca 59yr ago partial hysterectomy  . Collagen vascular disease     RA  3-4 yrs ago    Patient Active Problem List   Diagnosis Date Noted  . Abdominal abscess 08/25/2014  . Dysthymia 08/05/2014  . Other social stressor 08/05/2014  . Abscess of abdominal cavity 08/03/2014  . Bile leak, postoperative 07/17/2014  . Steatohepatitis 07/15/2014  . Cholecystitis   . Biliary colic 010/25/8527 . Abdominal pain   . Type 2 diabetes mellitus 07/12/2014  . COPD (chronic obstructive pulmonary disease) 07/12/2014  . HTN (hypertension) 07/12/2014  . GERD (gastroesophageal reflux disease) 07/12/2014  . OSA on CPAP 07/12/2014  . Anxiety 07/12/2014    Past Surgical History  Procedure Laterality Date  . Abdominal  hysterectomy    . Tubal ligation    . Colonoscopy with propofol N/A 06/23/2014    Procedure: COLONOSCOPY WITH PROPOFOL;  Surgeon: MLollie Sails MD;  [facility];  Service: Endoscopy;  Laterality: N/A;  . Esophagogastroduodenoscopy N/A 06/23/2014    Procedure: ESOPHAGOGASTRODUODENOSCOPY (EGD);  Surgeon: MLollie Sails MD;  [facility];  Service: Endoscopy;  Laterality: N/A;  . Cholecystectomy N/A 07/15/2014    Procedure: LAPAROSCOPIC CHOLECYSTECTOMY with liver biopsy ;  Surgeon: MSherri Rad MD;  Location: [facility] ORS;  Service: General;  Laterality: N/A;  . Ercp N/A 07/16/2014    Procedure: ENDOSCOPIC RETROGRADE CHOLANGIOPANCREATOGRAPHY (ERCP);  Surgeon: MClarene Essex MD;  [facility];  Service: Endoscopy;  Laterality: N/A;    Current Outpatient Rx  Name  Route  Sig  Dispense  Refill  . albuterol (PROVENTIL HFA;VENTOLIN HFA) 108 (90 BASE) MCG/ACT inhaler   Inhalation   Inhale 2 puffs into the lungs every 4 (four) hours as needed for wheezing or shortness of breath.         . ALPRAZolam (XANAX) 1 MG tablet   Oral   Take 1 tablet (1 mg total) by mouth 3 (three) times daily as needed for anxiety. Patient taking differently: Take 2 mg by mouth 3 (three) times daily.    30 tablet   0   . amoxicillin-clavulanate (AUGMENTIN) 875-125 MG per tablet   Oral   Take 1 tablet by mouth 2 (two) times daily.  14 tablet   0   . aspirin EC 81 MG EC tablet   Oral   Take 1 tablet (81 mg total) by mouth daily.   60 tablet   0   . citalopram (CELEXA) 20 MG tablet   Oral   Take 20 mg by mouth at bedtime.          . clotrimazole (MYCELEX) 10 MG troche   Oral   Take 10 mg by mouth 4 (four) times daily as needed (for dry mouth).          . fluticasone (FLONASE) 50 MCG/ACT nasal spray   Each Nare   Place 1 spray into both nostrils daily as needed for rhinitis.          . furosemide (LASIX) 40 MG tablet   Oral   Take 80 mg by mouth 2 (two) times  daily.          Marland Kitchen gabapentin (NEURONTIN) 300 MG capsule   Oral   Take 300 mg by mouth 4 (four) times daily as needed (for nerve pain).         . hydrochlorothiazide (HYDRODIURIL) 25 MG tablet   Oral   Take 12.5 mg by mouth daily as needed (when diastolic BP is greater than 60).         Marland Kitchen ipratropium (ATROVENT HFA) 17 MCG/ACT inhaler   Inhalation   Inhale 2 puffs into the lungs every 8 (eight) hours.         . metFORMIN (GLUCOPHAGE) 1000 MG tablet   Oral   Take 500-1,000 mg by mouth 2 (two) times daily as needed (for high blood sugar).         Marland Kitchen omeprazole (PRILOSEC) 20 MG capsule   Oral   Take 40 mg by mouth 2 (two) times daily as needed (for acid reflux).          Marland Kitchen oxyCODONE-acetaminophen (PERCOCET/ROXICET) 5-325 MG per tablet   Oral   Take 2 tablets by mouth every 4 (four) hours as needed for severe pain.   20 tablet   0   . OXYGEN   Inhalation   Inhale 1.5-2 L into the lungs as needed (Titrate to keep SpO2 between 90-94%).         . rizatriptan (MAXALT) 10 MG tablet   Oral   Take 10 mg by mouth as needed for migraine.            Allergies Vicodin  Family History  Problem Relation Age of Onset  . Lung cancer Father   . Ulcers Father   . Heart disease Sister   . Ulcers Sister   . Heart disease Brother     Social History Social History  Substance Use Topics  . Smoking status: Never Smoker   . Smokeless tobacco: Never Used  . Alcohol Use: No     Comment: occ    Review of Systems Constitutional:fever/chills Eyes: No visual changes. ENT: No sore throat. Cardiovascular: Denies chest pain. Respiratory: Denies shortness of breath. Gastrointestinal: See history of present illness. Genitourinary: Negative for dysuria. Musculoskeletal: Negative for back pain. Skin: Negative for rash. Neurological: Negative for headaches, focal weakness or numbness.  10-point ROS otherwise  negative.  ____________________________________________   PHYSICAL EXAM:  VITAL SIGNS: ED Triage Vitals  Enc Vitals Group     BP 09/04/14 2109 130/86 mmHg     Pulse Rate 09/04/14 2109 110     Resp 09/04/14 2109 20     Temp 09/04/14  2109 99.4 F (37.4 C)     Temp Source 09/04/14 2109 Oral     SpO2 09/04/14 2109 94 %     Weight 09/04/14 2109 242 lb (109.77 kg)     Height 09/04/14 2109 5' 2"  (1.575 m)     Head Cir --      Peak Flow --      Pain Score 09/04/14 2110 9     Pain Loc --      Pain Edu? --      Excl. in [place]? --     Constitutional: Alert and oriented. In moderate to mild distress Eyes: Conjunctivae are normal. PERRL. EOMI. Head: Atraumatic. Nose: No congestion/rhinnorhea. Mouth/Throat: Mucous membranes are moist.  Oropharynx non-erythematous. Neck: No stridor. Cardiovascular: Tachycardia, regular rhythm. Grossly normal heart sounds.  Good peripheral circulation. Respiratory: Normal respiratory effort.  No retractions. Lungs CTAB. Gastrointestinal: Somewhat firm diffusely tender to palpation percussion decreased bowel sounds and distended. No abdominal bruits. No CVA tenderness. Musculoskeletal: No lower extremity tenderness nor edema.  No joint effusions. Neurologic:  Normal speech and language. No gross focal neurologic deficits are appreciated. No gait instability. Skin:  Skin is warm, dry and intact. No rash noted. Psychiatric: Appears depressed  ____________________________________________   LABS (all labs ordered are listed, but only abnormal results are displayed)  Labs Reviewed  COMPREHENSIVE METABOLIC PANEL - Abnormal; Notable for the following:    Potassium 2.6 (*)    Chloride 96 (*)    Glucose, Bld 111 (*)    Calcium 8.6 (*)    Albumin 2.9 (*)    ALT 12 (*)    All other components within normal limits  CBC WITH DIFFERENTIAL/PLATELET - Abnormal; Notable for the following:    RBC 3.75 (*)    Hemoglobin 11.1 (*)    HCT 33.6 (*)    RDW 16.1 (*)     All other components within normal limits  URINALYSIS COMPLETEWITH MICROSCOPIC ([facility] ONLY) - Abnormal; Notable for the following:    Color, Urine AMBER (*)    APPearance CLOUDY (*)    Bilirubin Urine 1+ (*)    Ketones, ur TRACE (*)    Specific Gravity, Urine 1.032 (*)    Hgb urine dipstick 1+ (*)    Protein, ur 100 (*)    Leukocytes, UA TRACE (*)    Bacteria, UA RARE (*)    Squamous Epithelial / LPF TOO NUMEROUS TO COUNT (*)    All other components within normal limits  CULTURE, BLOOD (ROUTINE X 2)  CULTURE, BLOOD (ROUTINE X 2)  URINE CULTURE  TROPONIN I  LACTIC ACID, PLASMA  LIPASE, BLOOD  LACTIC ACID, PLASMA   ____________________________________________  EKG   ____________________________________________  RADIOLOGY   ____________________________________________   PROCEDURES   ____________________________________________   INITIAL IMPRESSION / ASSESSMENT AND PLAN / ED COURSE  Pertinent labs & imaging results that were available during my care of the patient were reviewed by me and considered in my medical decision making (see chart for details).   ____________________________________________   FINAL CLINICAL IMPRESSION(S) / ED DIAGNOSES  Final diagnoses:  Abdominal pain, unspecified abdominal location      Nena Polio, MD 09/04/14 2304 Surgeon is aware of the patient and Dr. Owens Shark will monitor the patient until CT comes back which will told surgeon again  Nena Polio, MD 09/04/14 2311

## 2014-09-05 ENCOUNTER — Encounter: Payer: Self-pay | Admitting: Internal Medicine

## 2014-09-05 ENCOUNTER — Ambulatory Visit: Admission: RE | Admit: 2014-09-05 | Payer: Medicaid Other | Source: Ambulatory Visit

## 2014-09-05 ENCOUNTER — Observation Stay: Payer: Medicaid Other

## 2014-09-05 DIAGNOSIS — R188 Other ascites: Secondary | ICD-10-CM | POA: Diagnosis present

## 2014-09-05 DIAGNOSIS — E876 Hypokalemia: Secondary | ICD-10-CM | POA: Diagnosis present

## 2014-09-05 DIAGNOSIS — K7581 Nonalcoholic steatohepatitis (NASH): Secondary | ICD-10-CM | POA: Diagnosis present

## 2014-09-05 LAB — CBC
HCT: 28.9 % — ABNORMAL LOW (ref 35.0–47.0)
Hemoglobin: 9.5 g/dL — ABNORMAL LOW (ref 12.0–16.0)
MCH: 29.3 pg (ref 26.0–34.0)
MCHC: 32.7 g/dL (ref 32.0–36.0)
MCV: 89.6 fL (ref 80.0–100.0)
Platelets: 168 10*3/uL (ref 150–440)
RBC: 3.22 MIL/uL — ABNORMAL LOW (ref 3.80–5.20)
RDW: 16.1 % — ABNORMAL HIGH (ref 11.5–14.5)
WBC: 5 10*3/uL (ref 3.6–11.0)

## 2014-09-05 LAB — BASIC METABOLIC PANEL
Anion gap: 9 (ref 5–15)
BUN: 7 mg/dL (ref 6–20)
CO2: 32 mmol/L (ref 22–32)
Calcium: 7.9 mg/dL — ABNORMAL LOW (ref 8.9–10.3)
Chloride: 98 mmol/L — ABNORMAL LOW (ref 101–111)
Creatinine, Ser: 0.58 mg/dL (ref 0.44–1.00)
GFR calc Af Amer: >60 mL/min (ref >60)
GFR calc non Af Amer: >60 mL/min (ref >60)
Glucose, Bld: 100 mg/dL — ABNORMAL HIGH (ref 65–99)
Potassium: 2.8 mmol/L — CL (ref 3.5–5.1)
Sodium: 139 mmol/L (ref 135–145)

## 2014-09-05 LAB — LACTIC ACID, PLASMA: Lactic Acid, Venous: 1.6 mmol/L (ref 0.5–2.0)

## 2014-09-05 LAB — GLUCOSE, CAPILLARY
Glucose-Capillary: 83 mg/dL (ref 65–99)
Glucose-Capillary: 83 mg/dL (ref 65–99)
Glucose-Capillary: 115 mg/dL — ABNORMAL HIGH (ref 65–99)

## 2014-09-05 LAB — BODY FLUID CELL COUNT WITH DIFFERENTIAL
Eos, Fluid: 0 %
Lymphs, Fluid: 32 %
Monocyte-Macrophage-Serous Fluid: 11 % — ABNORMAL LOW (ref 50–90)
Neutrophil Count, Fluid: 57 % — ABNORMAL HIGH (ref 0–25)
Other Cells, Fluid: 0 %
Total Nucleated Cell Count, Fluid: 1272 cu mm — ABNORMAL HIGH (ref 0–1000)

## 2014-09-05 LAB — ALBUMIN, FLUID (OTHER): Albumin, Fluid: 1.7 g/dL

## 2014-09-05 LAB — LACTATE DEHYDROGENASE, PLEURAL OR PERITONEAL FLUID: LD, Fluid: 85 U/L — ABNORMAL HIGH (ref 3–23)

## 2014-09-05 LAB — PHOSPHORUS: Phosphorus: 3.8 mg/dL (ref 2.5–4.6)

## 2014-09-05 LAB — PROTEIN, BODY FLUID: Total protein, fluid: 3.7 g/dL

## 2014-09-05 LAB — MAGNESIUM: Magnesium: 2 mg/dL (ref 1.7–2.4)

## 2014-09-05 MED ORDER — POTASSIUM CHLORIDE CRYS ER 20 MEQ PO TBCR
30.0000 meq | EXTENDED_RELEASE_TABLET | Freq: Every day | ORAL | Status: DC
  Administered 2014-09-05 – 2014-09-08 (×4): 30 meq via ORAL
  Filled 2014-09-05 (×4): qty 1

## 2014-09-05 MED ORDER — ALPRAZOLAM 1 MG PO TABS
1.0000 mg | ORAL_TABLET | Freq: Three times a day (TID) | ORAL | Status: DC | PRN
  Administered 2014-09-05 – 2014-09-08 (×6): 1 mg via ORAL
  Filled 2014-09-05 (×2): qty 1
  Filled 2014-09-05: qty 2
  Filled 2014-09-05 (×3): qty 1

## 2014-09-05 MED ORDER — PANTOPRAZOLE SODIUM 40 MG PO TBEC
40.0000 mg | DELAYED_RELEASE_TABLET | Freq: Two times a day (BID) | ORAL | Status: DC
  Administered 2014-09-05 – 2014-09-08 (×7): 40 mg via ORAL
  Filled 2014-09-05 (×7): qty 1

## 2014-09-05 MED ORDER — SODIUM CHLORIDE 0.9 % IJ SOLN
3.0000 mL | Freq: Two times a day (BID) | INTRAMUSCULAR | Status: DC
  Administered 2014-09-05 – 2014-09-08 (×8): 3 mL via INTRAVENOUS

## 2014-09-05 MED ORDER — ENOXAPARIN SODIUM 40 MG/0.4ML ~~LOC~~ SOLN: 40.0000 mg | SUBCUTANEOUS | Status: DC

## 2014-09-05 MED ORDER — HYDROCHLOROTHIAZIDE 25 MG PO TABS: 12.5000 mg | ORAL_TABLET | Freq: Every day | ORAL | Status: DC | PRN

## 2014-09-05 MED ORDER — GABAPENTIN 300 MG PO CAPS: 300.0000 mg | ORAL_CAPSULE | Freq: Four times a day (QID) | ORAL | Status: DC | PRN

## 2014-09-05 MED ORDER — ASPIRIN EC 81 MG PO TBEC
81.0000 mg | DELAYED_RELEASE_TABLET | Freq: Every day | ORAL | Status: DC
  Administered 2014-09-06 – 2014-09-08 (×3): 81 mg via ORAL
  Filled 2014-09-05 (×4): qty 1

## 2014-09-05 MED ORDER — CITALOPRAM HYDROBROMIDE 20 MG PO TABS
20.0000 mg | ORAL_TABLET | Freq: Every day | ORAL | Status: DC
  Administered 2014-09-05 – 2014-09-07 (×3): 20 mg via ORAL
  Filled 2014-09-05 (×3): qty 1

## 2014-09-05 MED ORDER — HYDROMORPHONE HCL 1 MG/ML IJ SOLN
2.0000 mg | INTRAMUSCULAR | Status: DC | PRN
  Administered 2014-09-05 – 2014-09-08 (×16): 2 mg via INTRAVENOUS
  Filled 2014-09-05 (×16): qty 2

## 2014-09-05 MED ORDER — MORPHINE SULFATE (PF) 4 MG/ML IV SOLN
4.0000 mg | INTRAVENOUS | Status: DC | PRN
  Administered 2014-09-05 (×3): 4 mg via INTRAVENOUS
  Filled 2014-09-05 (×3): qty 1

## 2014-09-05 MED ORDER — POTASSIUM CHLORIDE 10 MEQ/100ML IV SOLN
10.0000 meq | INTRAVENOUS | Status: AC
  Administered 2014-09-05 (×2): 10 meq via INTRAVENOUS
  Filled 2014-09-05 (×5): qty 100

## 2014-09-05 MED ORDER — ACETAMINOPHEN 325 MG PO TABS: 650.0000 mg | ORAL_TABLET | Freq: Four times a day (QID) | ORAL | Status: DC | PRN

## 2014-09-05 MED ORDER — ONDANSETRON HCL 4 MG/2ML IJ SOLN
4.0000 mg | Freq: Four times a day (QID) | INTRAMUSCULAR | Status: DC | PRN
  Administered 2014-09-06 – 2014-09-08 (×4): 4 mg via INTRAVENOUS
  Filled 2014-09-05 (×5): qty 2

## 2014-09-05 MED ORDER — IPRATROPIUM BROMIDE 0.02 % IN SOLN
2.5000 mL | Freq: Three times a day (TID) | RESPIRATORY_TRACT | Status: DC
  Administered 2014-09-05 – 2014-09-08 (×7): 0.5 mg via RESPIRATORY_TRACT
  Filled 2014-09-05 (×9): qty 2.5

## 2014-09-05 MED ORDER — IPRATROPIUM BROMIDE 0.02 % IN SOLN: 2.5000 mL | Freq: Three times a day (TID) | RESPIRATORY_TRACT | Status: DC

## 2014-09-05 MED ORDER — CIPROFLOXACIN IN D5W 400 MG/200ML IV SOLN
400.0000 mg | Freq: Two times a day (BID) | INTRAVENOUS | Status: DC
  Administered 2014-09-05 – 2014-09-07 (×6): 400 mg via INTRAVENOUS
  Filled 2014-09-05 (×9): qty 200

## 2014-09-05 MED ORDER — ENOXAPARIN SODIUM 40 MG/0.4ML ~~LOC~~ SOLN
40.0000 mg | Freq: Two times a day (BID) | SUBCUTANEOUS | Status: DC
Start: 2014-09-05 — End: 2014-09-08
  Administered 2014-09-05 – 2014-09-08 (×5): 40 mg via SUBCUTANEOUS
  Filled 2014-09-05 (×5): qty 0.4

## 2014-09-05 MED ORDER — ACETAMINOPHEN 650 MG RE SUPP: 650.0000 mg | Freq: Four times a day (QID) | RECTAL | Status: DC | PRN

## 2014-09-05 MED ORDER — FUROSEMIDE 40 MG PO TABS
80.0000 mg | ORAL_TABLET | Freq: Two times a day (BID) | ORAL | Status: DC
  Administered 2014-09-05 – 2014-09-08 (×4): 80 mg via ORAL
  Filled 2014-09-05 (×6): qty 2

## 2014-09-05 MED ORDER — INSULIN ASPART 100 UNIT/ML ~~LOC~~ SOLN: 0.0000 [IU] | Freq: Every day | SUBCUTANEOUS | Status: DC

## 2014-09-05 MED ORDER — INSULIN ASPART 100 UNIT/ML ~~LOC~~ SOLN
0.0000 [IU] | Freq: Three times a day (TID) | SUBCUTANEOUS | Status: DC
  Administered 2014-09-07 – 2014-09-08 (×4): 1 [IU] via SUBCUTANEOUS
  Filled 2014-09-05 (×4): qty 1

## 2014-09-05 MED ORDER — ONDANSETRON HCL 4 MG PO TABS
4.0000 mg | ORAL_TABLET | Freq: Four times a day (QID) | ORAL | Status: DC | PRN
  Administered 2014-09-06: 4 mg via ORAL
  Filled 2014-09-05: qty 1

## 2014-09-05 NOTE — Telephone Encounter (Signed)
Called to give patient appointment listed below. Patient's boyfriend states that patient is currently admitted to hospital and that they will call [facility] when she gets out if appointment is still needed.

## 2014-09-05 NOTE — Progress Notes (Signed)
Anticoagulation monitoring(Lovenox) in Obese patient:  49yo F ordered Lovenox 40 mg Q24h  Filed Weights   09/04/14 2109 09/05/14 0150  Weight: 242 lb (109.77 kg) 241 lb 14.4 oz (109.725 kg)  Ht 5'2in  BMI 44.3 Lab Results  Component Value Date   CREATININE 0.58 09/05/2014   CREATININE 0.62 09/04/2014   CREATININE 0.50 08/25/2014   Estimated Creatinine Clearance: 97.1 mL/min (by C-G formula based on Cr of 0.58). Hemoglobin & Hematocrit     Component Value Date/Time   HGB 9.5* 09/05/2014 0459   HGB 16.5* 03/27/2014 1042   HCT 28.9* 09/05/2014 0459   HCT 49.3* 03/27/2014 1042     Per Protocol for Patient with BMI > 40 and estCrcl> 30 ml/min, will transition to Lovenox 40 mg Q12h.      Chinita Greenland PharmD Clinical Pharmacist 09/05/2014 11:36 AM

## 2014-09-05 NOTE — Progress Notes (Signed)
Subjective:   She is much improved clinically over the last time I saw her in the office. She had nearly 6 L of ascitic fluid removed from her abdomen today with her paracentesis. She is breathing much more comfortably and has less abdominal pain. Laboratory studies still pending. Recent CT demonstrated more ascites but no evidence of any further infection nothing to suggest that this current problems related to her biliary tract issues of 2 months ago.  Vital signs in last 24 hours: Temp:  [97.7 F (36.5 C)-99.4 F (37.4 C)] 98.2 F (36.8 C) (09/02 1614) Pulse Rate:  [74-110] 92 (09/02 1614) Resp:  [15-27] 17 (09/02 1614) BP: (86-131)/(52-97) 100/63 mmHg (09/02 1614) SpO2:  [92 %-100 %] 93 % (09/02 1614) Weight:  [241 lb 14.4 oz (109.725 kg)-242 lb (109.77 kg)] 241 lb 14.4 oz (109.725 kg) (09/02 0150) Last BM Date: 09/04/14  Intake/Output from previous day:    Exam:  Her abdomen is soft and much less tender and much less distended. I believe I can feel her liver edge in the right upper quadrant.  Lab Results:  CBC  Recent Labs  09/04/14 2123 09/05/14 0459  WBC 7.4 5.0  HGB 11.1* 9.5*  HCT 33.6* 28.9*  PLT 210 168   CMP     Component Value Date/Time   NA 139 09/05/2014 0459   NA 142 03/27/2014 1042   K 2.8* 09/05/2014 0459   K 3.5 03/27/2014 1042   CL 98* 09/05/2014 0459   CL 101 03/27/2014 1042   CO2 32 09/05/2014 0459   CO2 27 03/27/2014 1042   GLUCOSE 100* 09/05/2014 0459   GLUCOSE 119* 03/27/2014 1042   BUN 7 09/05/2014 0459   BUN 16 03/27/2014 1042   CREATININE 0.58 09/05/2014 0459   CREATININE 0.73 03/27/2014 1042   CALCIUM 7.9* 09/05/2014 0459   CALCIUM 10.4* 03/27/2014 1042   PROT 7.6 09/04/2014 2123   PROT 9.4* 03/27/2014 1042   ALBUMIN 2.9* 09/04/2014 2123   ALBUMIN 5.3* 03/27/2014 1042   AST 34 09/04/2014 2123   AST 81* 03/27/2014 1042   ALT 12* 09/04/2014 2123   ALT 58* 03/27/2014 1042   ALKPHOS 54 09/04/2014 2123   ALKPHOS 63 03/27/2014 1042    BILITOT 0.8 09/04/2014 2123   BILITOT 1.5* 03/27/2014 1042   GFRNONAA >60 09/05/2014 0459   GFRNONAA >60 03/27/2014 1042   GFRNONAA >60 11/17/2013 0416   GFRAA >60 09/05/2014 0459   GFRAA >60 03/27/2014 1042   GFRAA >60 11/17/2013 0416   PT/INR No results for input(s): LABPROT, INR in the last 72 hours.  Studies/Results: Ct Abdomen Pelvis W Contrast  09/04/2014   CLINICAL DATA:  Increasing abdominal girth and weight gain, with fever. Diffuse abdominal pain. Recent cholecystectomy and bile leak. Initial encounter.  EXAM: CT ABDOMEN AND PELVIS WITH CONTRAST  TECHNIQUE: Multidetector CT imaging of the abdomen and pelvis was performed using the standard protocol following bolus administration of intravenous contrast.  CONTRAST:  140m OMNIPAQUE IOHEXOL 300 MG/ML  SOLN  COMPARISON:  CT of the abdomen and pelvis performed 08/29/2014  FINDINGS: A small left pleural effusion is noted, with associated atelectasis.  Moderate to large volume ascites within the abdomen and pelvis appears mildly worsened from the prior study.  Fluid at the gallbladder fossa is slightly less apparent than on the prior study. There is no evidence of recurrent bile leak. Scattered clips are seen about the gallbladder fossa. The spleen is enlarged, measuring 18.4 cm in length. Hypodensities within the  left hepatic lobe measure up to 1.9 cm in size, nonspecific in nature. The mildly nodular contour of the liver raises concern for hepatic cirrhosis.  The pancreas and adrenal glands are grossly unremarkable. A pancreatic duct stent is noted ending at the second segment of the duodenum.  The kidneys are unremarkable in appearance. There is no evidence of hydronephrosis. No renal or ureteral stones are seen. No perinephric stranding is appreciated.  No free fluid is identified. The small bowel is unremarkable in appearance. The stomach is within normal limits. No acute vascular abnormalities are seen.  Vague nonspecific edema is noted  within the omentum. This may be related to the patient's cirrhosis.  The appendix is normal in caliber, without evidence of appendicitis. The colon is largely decompressed and is unremarkable in appearance.  The bladder is mildly distended and grossly unremarkable. The patient is status post hysterectomy. No suspicious adnexal masses are seen. No inguinal lymphadenopathy is seen.  No acute osseous abnormalities are identified.  IMPRESSION: 1. Moderate to large volume ascites within the abdomen and pelvis is mildly increased from the prior study. 2. No evidence of recurrent bile leak. 3. Findings suggestive of hepatic cirrhosis. Underlying splenomegaly noted. Nonspecific hypodensities within the left hepatic lobe measure up to 1.9 cm in size. 4. Vague nonspecific soft tissue edema within the omentum. This may be related to the patient's cirrhosis. 5. Small left pleural effusion noted, with associated atelectasis.   Electronically Signed   By: Garald Balding M.D.   On: 09/04/2014 23:52   US Paracentesis  09/05/2014   CLINICAL DATA:  persistent abdominal pain. Patient had cholecystectomy done laparoscopically couple of months ago now, which was complicated by a bile leak at the time. On follow-up evaluation, the bile drain completely from her abdomen, and drains removed, and the patient has been healing routinely since. 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 Since her surgery, her ascites has continued to build more dramatically. She came to the ED today for the same plus a report of fevers at home. On evaluation in the ED today her workup was largely benign, with a normal white count and labs otherwise largely normal except for hypokalemia with potassium of 2.6. CT abdomen and pelvis does not show any signs of recurrent or persistent bile leakage or bile in her abdominal cavity. It does show significant ascites slightly greater than  prior imaging. Surgery was consult is from the ED and does not feel this issue is related to her prior surgery or that she has a surgical problem at this time, hospitalists were then called for admission was some concern for possible SBP given the report of fevers and the presence of ascites.  EXAM: ULTRASOUND GUIDED PARACENTESIS  TECHNIQUE: The procedure, risks (including but not limited to bleeding, infection, organ damage ), benefits, and alternatives were explained to the patient. Questions regarding the procedure were encouraged and answered. The patient understands and consents to the procedure. Survey ultrasound of the abdomen was performed and an appropriate skin entry site in the right lateral abdomen was selected. Skin site was marked, prepped with chlorhexidine, and draped in usual sterile fashion, and infiltrated locally with 1% lidocaine. A Safe-T-Centesis sheath needle was advanced into the peritoneal space until fluid could be aspirated. The sheath was advanced and the needle removed. 5.78 L of clear yellowascites were aspirated. A sample sent for the requested laboratory studies. The patient tolerated the  procedure well.  COMPLICATIONS: COMPLICATIONS none  IMPRESSION: Technically successful ultrasound guided paracentesis, removing 5.78 L of ascites.   Electronically Signed   By: Lucrezia Europe M.D.   On: 09/05/2014 12:53    Assessment/Plan: Question of course is etiology of this ascitic fluid. That she simply have an exacerbation of her known [name]. With her recent abdominal abscess this fluid could have significant bacterial component and she would require antibiotics for some time to clear that infection. However, her white blood cell count is not elevated and she does not have any significant peritoneal signs. I wonder if her liver disease is not more severe than we are aware. At the present time there are no surgical indications. We'll continue to follow her while she is hospitalized.

## 2014-09-05 NOTE — Progress Notes (Signed)
[facility] [place] NAME: Lori Mckenzie    MR#:  [id number]  DATE OF BIRTH:  [birth year]  SUBJECTIVE: 49 year old female patient came in with severe abdominal pain. She also noted to have fever up to 101 Fahrenheit at home. Complains of nausea, vomiting, diarrhea. She has ascites and scheduled to have diagnostic, therapeutic paracentesis by IR today morning.   CHIEF COMPLAINT:   Chief Complaint  Patient presents with  . Post-op Problem    REVIEW OF SYSTEMS:   Review of Systems  Constitutional: Negative for fever and chills.  HENT: Negative for hearing loss.   Eyes: Negative for blurred vision, double vision and photophobia.  Respiratory: Negative for cough, hemoptysis and shortness of breath.   Cardiovascular: Negative for palpitations, orthopnea and leg swelling.  Gastrointestinal: Positive for nausea, vomiting and abdominal pain. Negative for diarrhea.       Ascites, generalized abdominal tenderness present, bowel sounds diminished.  Genitourinary: Negative for dysuria and urgency.  Musculoskeletal: Negative for myalgias and neck pain.  Skin: Negative for rash.  Neurological: Negative for dizziness, focal weakness, seizures, weakness and headaches.  Psychiatric/Behavioral: Negative for memory loss. The patient does not have insomnia.      DRUG ALLERGIES:   Allergies  Allergen Reactions  . Vicodin [Hydrocodone-Acetaminophen] Itching and Rash    VITALS:  Blood pressure 90/52, pulse 74, temperature 97.8 F (36.6 C), temperature source Oral, resp. rate 17, height 5' 2"  (1.575 m), weight 109.725 kg (241 lb 14.4 oz), SpO2 97 %.  PHYSICAL EXAMINATION:  GENERAL:  49 y.o.-year-old patient lying in the bed with no acute distress.  EYES: Pupils equal, round, reactive to light and accommodation. No scleral icterus. Extraocular muscles intact.  HEENT: Head atraumatic, normocephalic. Oropharynx and nasopharynx clear.  NECK:  Supple, no  jugular venous distention. No thyroid enlargement, no tenderness.  LUNGS: Normal breath sounds bilaterally, no wheezing, rales,rhonchi or crepitation. No use of accessory muscles of respiration.  CARDIOVASCULAR: S1, S2 normal. No murmurs, rubs, or gallops.  ABDOMEN: Generalized abdominal tenderness present decreased bowel sounds, ascites and fluid thrill present EXTREMITIES: No pedal edema, cyanosis, or clubbing.  NEUROLOGIC: Cranial nerves II through XII are intact. Muscle strength 5/5 in all extremities. Sensation intact. Gait not checked.  PSYCHIATRIC: The patient is alert and oriented x 3.  SKIN: No obvious rash, lesion, or ulcer.    LABORATORY PANEL:   CBC  Recent Labs Lab 09/05/14 0459  WBC 5.0  HGB 9.5*  HCT 28.9*  PLT 168   ------------------------------------------------------------------------------------------------------------------  Chemistries   Recent Labs Lab 09/04/14 2123 09/05/14 0459  NA 138 139  K 2.6* 2.8*  CL 96* 98*  CO2 30 32  GLUCOSE 111* 100*  BUN 8 7  CREATININE 0.62 0.58  CALCIUM 8.6* 7.9*  MG 2.0  --   AST 34  --   ALT 12*  --   ALKPHOS 54  --   BILITOT 0.8  --    ------------------------------------------------------------------------------------------------------------------  Cardiac Enzymes  Recent Labs Lab 09/04/14 2123  TROPONINI <0.03   ------------------------------------------------------------------------------------------------------------------  RADIOLOGY:  Ct Abdomen Pelvis W Contrast  09/04/2014   CLINICAL DATA:  Increasing abdominal girth and weight gain, with fever. Diffuse abdominal pain. Recent cholecystectomy and bile leak. Initial encounter.  EXAM: CT ABDOMEN AND PELVIS WITH CONTRAST  TECHNIQUE: Multidetector CT imaging of the abdomen and pelvis was performed using the standard protocol following bolus administration of intravenous contrast.  CONTRAST:  14m OMNIPAQUE IOHEXOL 300 MG/ML  SOLN  COMPARISON:  CT of  the abdomen and pelvis performed 08/29/2014  FINDINGS: A small left pleural effusion is noted, with associated atelectasis.  Moderate to large volume ascites within the abdomen and pelvis appears mildly worsened from the prior study.  Fluid at the gallbladder fossa is slightly less apparent than on the prior study. There is no evidence of recurrent bile leak. Scattered clips are seen about the gallbladder fossa. The spleen is enlarged, measuring 18.4 cm in length. Hypodensities within the left hepatic lobe measure up to 1.9 cm in size, nonspecific in nature. The mildly nodular contour of the liver raises concern for hepatic cirrhosis.  The pancreas and adrenal glands are grossly unremarkable. A pancreatic duct stent is noted ending at the second segment of the duodenum.  The kidneys are unremarkable in appearance. There is no evidence of hydronephrosis. No renal or ureteral stones are seen. No perinephric stranding is appreciated.  No free fluid is identified. The small bowel is unremarkable in appearance. The stomach is within normal limits. No acute vascular abnormalities are seen.  Vague nonspecific edema is noted within the omentum. This may be related to the patient's cirrhosis.  The appendix is normal in caliber, without evidence of appendicitis. The colon is largely decompressed and is unremarkable in appearance.  The bladder is mildly distended and grossly unremarkable. The patient is status post hysterectomy. No suspicious adnexal masses are seen. No inguinal lymphadenopathy is seen.  No acute osseous abnormalities are identified.  IMPRESSION: 1. Moderate to large volume ascites within the abdomen and pelvis is mildly increased from the prior study. 2. No evidence of recurrent bile leak. 3. Findings suggestive of hepatic cirrhosis. Underlying splenomegaly noted. Nonspecific hypodensities within the left hepatic lobe measure up to 1.9 cm in size. 4. Vague nonspecific soft tissue edema within the omentum.  This may be related to the patient's cirrhosis. 5. Small left pleural effusion noted, with associated atelectasis.   Electronically Signed   By: Garald Balding M.D.   On: 09/04/2014 23:52    EKG:   Orders placed or performed during the hospital encounter of 09/04/14  . ED EKG  . ED EKG  . EKG 12-Lead  . EKG 12-Lead  . EKG 12-Lead  . EKG 12-Lead    ASSESSMENT AND PLAN:   ; [place]-, cirrhosis secondary to [place] 1 abdominal pain related to possible ascites and that there is a concern for possible SBP secondary to fever. Patient needs a diagnostic and therapeutic paracentesis. continue pain medication, start antibiotics secondary to worsening symptoms. #2: History of nonalcoholic steatohepatitis: Continue present treatment, GI consult appreciated #3 diabetes type 2 continue sliding scale with coverage patient is now nothing by mouth so hold the metformin. #4 History of COPD patient has no wheezing continue home medications concerned about the possible respiratory depression with the ongoing ascites and pain medication use. #5 Hypotension likely secondary to pain medicine: Patient can have a paracentesis. 6.History of hypokalemia replace as needed 7.Hypertension right now she is hypotensive hold hypertension medications 8.Recent laparoscopic cholecystectomy , with complicated postop course with the biliary leak, CT abdomen did not show any. Biliary leak.  9.ascites/cirrhosis/NASH All the records are reviewed and case discussed with Care Management/Social Workerr. Management plans discussed with the patient, family and they are in agreement.  CODE STATUS: Full  TOTAL TIME TAKING CARE OF THIS PATIENT: 35 minutes.   POSSIBLE D/C IN 1-2 DAYS, DEPENDING ON CLINICAL CONDITION.   Epifanio Lesches M.D on 09/05/2014 at 9:32 AM  Between 7am to 6pm - Pager - [phone]  After 6pm go to [email or web address] - password [email or web address] [facility]  Office  [phone]  CC: Primary  care physician; Ashkin, Neldon Labella, MD

## 2014-09-05 NOTE — Consult Note (Signed)
GI Inpatient Consult Note  Reason for Consult: Recurrent Ascites, concern for SBP   Attending Requesting Consult: Dr. Jannifer Franklin  History of Present Illness: Lori Mckenzie is a 49 y.o. female Reports that she has had discomfort and spasms in her abdomen prior to coming to the hospital.  She had a paracentesis earlier today:  IMPRESSION: Successful CT guided placement of a 10 French all purpose drain catheter into the gallbladder fossa via a transhepatic approach with aspiration of 300 mL of purulent, foul smelling fluid. Samples were sent to the laboratory as requested by the ordering clinical team.  , she reports 30-40 minutes after the procedure she started to have 9/10 pain with bilateral lower cramping which progressed to her entire abdomen.  She is being given morphine for this and still reports 9/10 pain.  She reports prior to coming to the hospital she was experiencing shortness of breath, she reports this has alleviated some however she feels she is taking deeper breaths.  She does have COPD, was never a smoker, and is currently on 1.5-2 L of oxygen at home.  She reports that she uses it at night, but also has to use it in the heat or if she is exerting herself.  She is status post a cholecystectomy in which she had a complication of a bile leak.  She had a drain placed to help remove the bile.  Upon removal of the drain, which occurred on August 21, 2014 she began to eat ascites.  A suture of the opening was performed.   Dr. Rexene Edison started her on Augmentin b.i.d. For 7 days to prevent against bacterial peritonitis.  She then saw Dr. Marina Gravel  On August 25, 2014.  At that time she had not had any additional drainage.  She did report a fever to 101 and that she had been feeling poorly at that time and continued to have abdominal pain.  She was instructed at that time to go the hospital though refused over the weekend.  At that time her weight was up 10 lb from the last visit and she had increased  abdominal distension.  She has been hospitalized a couple different times, mostly for pain control.  She has also been seen in our office by [place].   Hey has performed a upper endoscopy as well as and colonoscopy, these were completed prep before her cholecystectomy.  We are following her with her NASH/cirrhosis diagnosis.  Past Medical History:  Past Medical History  Diagnosis Date  . Brittle bone disease   . Asthma   . Thyroid disease   . Hypertension   . Back pain   . Diabetes mellitus without complication   . Chronic kidney disease   . Anxiety   . GERD (gastroesophageal reflux disease)   . Sleep apnea   . COPD (chronic obstructive pulmonary disease)   . Hypothyroidism   . Cervical disc disease   . TIA (transient ischemic attack)   . Cancer     Uteriine  ca 66yr ago partial hysterectomy  . Collagen vascular disease     RA  3-4 yrs ago  . Ascites   . NASH (nonalcoholic steatohepatitis)     Problem List: Patient Active Problem List   Diagnosis Date Noted  . Ascites 09/05/2014  . NASH (nonalcoholic steatohepatitis) 09/05/2014  . Hypokalemia 09/05/2014  . Abdominal abscess 08/25/2014  . Dysthymia 08/05/2014  . Other social stressor 08/05/2014  . Abscess of abdominal cavity 08/03/2014  . Bile leak, postoperative 07/17/2014  .  Steatohepatitis 07/15/2014  . Cholecystitis   . Biliary colic 09/32/6712  . Abdominal pain   . Type 2 diabetes mellitus 07/12/2014  . COPD (chronic obstructive pulmonary disease) 07/12/2014  . HTN (hypertension) 07/12/2014  . GERD (gastroesophageal reflux disease) 07/12/2014  . OSA on CPAP 07/12/2014  . Anxiety 07/12/2014    Past Surgical History: Past Surgical History  Procedure Laterality Date  . Abdominal hysterectomy    . Tubal ligation    . Colonoscopy with propofol N/A 06/23/2014    Procedure: COLONOSCOPY WITH PROPOFOL;  Surgeon: Lollie Sails, MD;  [facility] ENDOSCOPY;  Service: Endoscopy;  Laterality: N/A;  .  Esophagogastroduodenoscopy N/A 06/23/2014    Procedure: ESOPHAGOGASTRODUODENOSCOPY (EGD);  Surgeon: Lollie Sails, MD;  Location[facility] ENDOSCOPY;  Service: Endoscopy;  Laterality: N/A;  . Cholecystectomy N/A 07/15/2014    Procedure: LAPAROSCOPIC CHOLECYSTECTOMY with liver biopsy ;  Surgeon: Sherri Rad, MD;  Location: [facility] ORS;  Service: General;  Laterality: N/A;  . Ercp N/A 07/16/2014    Procedure: ENDOSCOPIC RETROGRADE CHOLANGIOPANCREATOGRAPHY (ERCP);  Surgeon: Clarene Essex, MD;  Location: [facility] ENDOSCOPY;  Service: Endoscopy;  Laterality: N/A;    Allergies: Allergies  Allergen Reactions  . Vicodin [Hydrocodone-Acetaminophen] Itching and Rash    Home Medications: Prescriptions prior to admission  Medication Sig Dispense Refill Last Dose  . albuterol (PROVENTIL HFA;VENTOLIN HFA) 108 (90 BASE) MCG/ACT inhaler Inhale 2 puffs into the lungs every 4 (four) hours as needed for wheezing or shortness of breath.   unknown at unknown  . ALPRAZolam (XANAX) 1 MG tablet Take 1 tablet (1 mg total) by mouth 3 (three) times daily as needed for anxiety. (Patient taking differently: Take 2 mg by mouth 3 (three) times daily. ) 30 tablet 0 unknown at unknown  . amoxicillin-clavulanate (AUGMENTIN) 875-125 MG per tablet Take 1 tablet by mouth 2 (two) times daily. 14 tablet 0 unknown at unknown  . aspirin EC 81 MG EC tablet Take 1 tablet (81 mg total) by mouth daily. 60 tablet 0 unknown at unknown  . citalopram (CELEXA) 20 MG tablet Take 20 mg by mouth at bedtime.    unknown at unknown  . clotrimazole (MYCELEX) 10 MG troche Take 10 mg by mouth 4 (four) times daily as needed (for dry mouth).    unknown at unknown  . fluticasone (FLONASE) 50 MCG/ACT nasal spray Place 1 spray into both nostrils daily as needed for rhinitis.    unknown at unknown  . furosemide (LASIX) 40 MG tablet Take 80 mg by mouth 2 (two) times daily.    unknown at unknown  . gabapentin (NEURONTIN) 300 MG capsule Take 300 mg by mouth 4 (four) times  daily as needed (for nerve pain).   unknown at unknown  . hydrochlorothiazide (HYDRODIURIL) 25 MG tablet Take 12.5 mg by mouth daily as needed (when diastolic BP is greater than 60).   unknown at unknown  . ipratropium (ATROVENT HFA) 17 MCG/ACT inhaler Inhale 2 puffs into the lungs every 8 (eight) hours.   unkown at unknown  . metFORMIN (GLUCOPHAGE) 1000 MG tablet Take 500-1,000 mg by mouth 2 (two) times daily as needed (for high blood sugar).   unknown at unknown  . omeprazole (PRILOSEC) 20 MG capsule Take 40 mg by mouth 2 (two) times daily as needed (for acid reflux).    unknown at unknown  . oxyCODONE-acetaminophen (PERCOCET/ROXICET) 5-325 MG per tablet Take 2 tablets by mouth every 4 (four) hours as needed for severe pain. 20 tablet 0 unknown at unknown  .  OXYGEN Inhale 1.5-2 L into the lungs as needed (Titrate to keep SpO2 between 90-94%).   unknown at unknown  . rizatriptan (MAXALT) 10 MG tablet Take 10 mg by mouth as needed for migraine.    unknown at unknown   Home medication reconciliation was completed with the patient.   Scheduled Inpatient Medications:   . aspirin EC  81 mg Oral Daily  . ciprofloxacin  400 mg Intravenous Q12H  . citalopram  20 mg Oral QHS  . enoxaparin (LOVENOX) injection  40 mg Subcutaneous Q12H  . furosemide  80 mg Oral BID  . insulin aspart  0-5 Units Subcutaneous QHS  . insulin aspart  0-9 Units Subcutaneous TID WC  . ipratropium  2.5 mL Inhalation TID  . pantoprazole  40 mg Oral BID AC  . potassium chloride  30 mEq Oral Daily  . sodium chloride  3 mL Intravenous Q12H    Continuous Inpatient Infusions:     PRN Inpatient Medications:  acetaminophen **OR** acetaminophen, ALPRAZolam, gabapentin, hydrochlorothiazide, morphine injection, ondansetron **OR** ondansetron (ZOFRAN) IV  Family History: family history includes Heart disease in her brother and sister; Lung cancer in her father; Ulcers in her father and sister.  Social History:   reports that she  has never smoked. She has never used smokeless tobacco. She reports that she does not drink alcohol or use illicit drugs.   Review of Systems: Constitutional: Weight is stable.  Eyes: No changes in vision. ENT: No oral lesions, sore throat.  GI: see HPI.  Heme/Lymph: No easy bruising.  CV: No chest pain.  GU: No hematuria.  Integumentary: No rashes.  Neuro: No headaches.  Psych: No depression  Endocrine: No heat/cold intolerance.  Allergic/Immunologic: No urticaria.  Resp: No cough,  Musculoskeletal: No joint swelling.    Physical Examination: BP 94/54 mmHg  Pulse 84  Temp(Src) 97.8 F (36.6 C) (Oral)  Resp 20  Ht 5' 2"  (1.575 m)  Wt 109.725 kg (241 lb 14.4 oz)  BMI 44.23 kg/m2  SpO2 94% Gen:  alert and oriented x 4. In moderate distress, very anxious.  Husband was bedside and helped with history. Patient was sitting on her bedside and became tearful several times during interview HEENT: PEERLA, EOMI, Neck: supple, no JVD or thyromegaly Chest: CTA bilaterally, no wheezes, crackles, or other adventitious sounds.  Breaths taken were not very deep, Patient was asked to take deep breaths and said it was the best she could do.   CV: RRR, no m/g/c/r Abd: generalized tenderness, slight distention, +BS in all four quadrants; no HSM, guarding, ridigity, or rebound tenderness Ext: no edema, well perfused with 2+ pulses, Skin: no rash or lesions noted Lymph: no LAD  Data: Lab Results  Component Value Date   WBC 5.0 09/05/2014   HGB 9.5* 09/05/2014   HCT 28.9* 09/05/2014   MCV 89.6 09/05/2014   PLT 168 09/05/2014    Recent Labs Lab 09/04/14 2123 09/05/14 0459  HGB 11.1* 9.5*   Lab Results  Component Value Date   NA 139 09/05/2014   K 2.8* 09/05/2014   CL 98* 09/05/2014   CO2 32 09/05/2014   BUN 7 09/05/2014   CREATININE 0.58 09/05/2014   Lab Results  Component Value Date   ALT 12* 09/04/2014   AST 34 09/04/2014   ALKPHOS 54 09/04/2014   BILITOT 0.8 09/04/2014    No results for input(s): APTT, INR, PTT in the last 168 hours.   Imaging:   CLINICAL DATA: Increasing abdominal girth  and weight gain, with fever. Diffuse abdominal pain. Recent cholecystectomy and bile leak. Initial encounter.  EXAM: CT ABDOMEN AND PELVIS WITH CONTRAST  TECHNIQUE: Multidetector CT imaging of the abdomen and pelvis was performed using the standard protocol following bolus administration of intravenous contrast.  CONTRAST: 161m OMNIPAQUE IOHEXOL 300 MG/ML SOLN  COMPARISON: CT of the abdomen and pelvis performed 08/29/2014  FINDINGS: A small left pleural effusion is noted, with associated atelectasis.  Moderate to large volume ascites within the abdomen and pelvis appears mildly worsened from the prior study.  Fluid at the gallbladder fossa is slightly less apparent than on the prior study. There is no evidence of recurrent bile leak. Scattered clips are seen about the gallbladder fossa. The spleen is enlarged, measuring 18.4 cm in length. Hypodensities within the left hepatic lobe measure up to 1.9 cm in size, nonspecific in nature. The mildly nodular contour of the liver raises concern for hepatic cirrhosis.  The pancreas and adrenal glands are grossly unremarkable. A pancreatic duct stent is noted ending at the second segment of the duodenum.  The kidneys are unremarkable in appearance. There is no evidence of hydronephrosis. No renal or ureteral stones are seen. No perinephric stranding is appreciated.  No free fluid is identified. The small bowel is unremarkable in appearance. The stomach is within normal limits. No acute vascular abnormalities are seen.  Vague nonspecific edema is noted within the omentum. This may be related to the patient's cirrhosis.  The appendix is normal in caliber, without evidence of appendicitis. The colon is largely decompressed and is unremarkable in appearance.  The bladder is mildly distended  and grossly unremarkable. The patient is status post hysterectomy. No suspicious adnexal masses are seen. No inguinal lymphadenopathy is seen.  No acute osseous abnormalities are identified.  IMPRESSION: 1. Moderate to large volume ascites within the abdomen and pelvis is mildly increased from the prior study. 2. No evidence of recurrent bile leak. 3. Findings suggestive of hepatic cirrhosis. Underlying splenomegaly noted. Nonspecific hypodensities within the left hepatic lobe measure up to 1.9 cm in size. 4. Vague nonspecific soft tissue edema within the omentum. This may be related to the patient's cirrhosis. 5. Small left pleural effusion noted, with associated atelectasis.   Electronically Signed  By: JGarald BaldingM.D.  On: 09/04/2014 23:52  CLINICAL DATA: persistent abdominal pain. Patient had cholecystectomy done laparoscopically couple of months ago now, which was complicated by a bile leak at the time. On follow-up evaluation, the bile drain completely from her abdomen, and drains removed, and the patient has been healing routinely since. 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 Since her surgery, her ascites has continued to build more dramatically. She came to the ED today for the same plus a report of fevers at home. On evaluation in the ED today her workup was largely benign, with a normal white count and labs otherwise largely normal except for hypokalemia with potassium of 2.6. CT abdomen and pelvis does not show any signs of recurrent or persistent bile leakage or bile in her abdominal cavity. It does show significant ascites slightly greater than prior imaging. Surgery was consult is from the ED and does not feel this issue is related to her prior surgery or that she has a surgical problem at this time, hospitalists were then called for admission was some concern for possible  SBP given the report of fevers and the presence of ascites.  EXAM: ULTRASOUND GUIDED PARACENTESIS  TECHNIQUE: The procedure, risks (including but not limited to bleeding, infection, organ damage ), benefits, and alternatives were explained to the patient. Questions regarding the procedure were encouraged and answered. The patient understands and consents to the procedure. Survey ultrasound of the abdomen was performed and an appropriate skin entry site in the right lateral abdomen was selected. Skin site was marked, prepped with chlorhexidine, and draped in usual sterile fashion, and infiltrated locally with 1% lidocaine. A Safe-T-Centesis sheath needle was advanced into the peritoneal space until fluid could be aspirated. The sheath was advanced and the needle removed. 5.78 L of clear yellowascites were aspirated. A sample sent for the requested laboratory studies. The patient tolerated the procedure well.  COMPLICATIONS: COMPLICATIONS none  IMPRESSION: Technically successful ultrasound guided paracentesis, removing 5.78 L of ascites.   Electronically Signed  By: Lucrezia Europe M.D.  On: 09/05/2014 12:53 Assessment/Plan: Lori Mckenzie is a 49 y.o. female  With ascites with possible SBP  Recommendations: Patient was started on Cipro empirically and she has already had a course of Augmentin,  We will wait on the culture and may need to consult ID to determine appropriate treatment.  We are following patient in our office for her NASH/Cirrhosis diagnosis and need to maintain close follow up upon discharge.her next appt with [place] is 9/7 at 11:30 AM.  Please also see Dr. Myrna Blazer note for further GI recommendations.  We will continue to follow with you. Thank you for the consult. Please call with questions or concerns.  Salvadore Farber, PA-C  I personally performed these services.

## 2014-09-05 NOTE — Progress Notes (Signed)
Spoke with Dr. Vianne Bulls about pt status and BP. Pt did receive pain medication (morphine) this am prior to other BP assessments.  MD advised that she should continue with paracentesis as scheduled.  BP currently 92/64 manual check.

## 2014-09-05 NOTE — ED Notes (Signed)
Attempted to call report.  Floor states receiving nurse is in a contact room and will call this nurse back.

## 2014-09-05 NOTE — H&P (Signed)
[facility] [place] NAME: Lori Mckenzie    MR#:  [id number]  DATE OF BIRTH:  June 21, 1965  DATE OF ADMISSION:  09/04/2014  PRIMARY CARE PHYSICIAN: Ashkin, Neldon Labella, MD   REQUESTING/REFERRING PHYSICIAN: Owens Shark, M.D.  CHIEF COMPLAINT:   Chief Complaint  Patient presents with  . Post-op Problem    HISTORY OF PRESENT ILLNESS:  Lori Mckenzie  is a 49 y.o. female who presents with persistent abdominal pain. Patient had cholecystectomy done laparoscopically couple of months ago now, which was complicated by a bile leak at the time. On follow-up evaluation, the bile drain completely from her abdomen, and drains removed, and the patient has been healing routinely since. 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 Since her surgery, her ascites has continued to build more dramatically. She came to the ED today for the same plus a report of fevers at home. On evaluation in the ED today her workup was largely benign, with a normal white count and labs otherwise largely normal except for hypokalemia with potassium of 2.6. CT abdomen and pelvis does not show any signs of recurrent or persistent bile leakage or bile in her abdominal cavity. It does show significant ascites slightly greater than prior imaging. Surgery was consult is from the ED and does not feel this issue is related to her prior surgery or that she has a surgical problem at this time, hospitalists were then called for admission was some concern for possible SBP given the report of fevers and the presence of ascites.  PAST MEDICAL HISTORY:   Past Medical History  Diagnosis Date  . Brittle bone disease   . Asthma   . Thyroid disease   . Hypertension   . Back pain   . Diabetes mellitus without complication   . Chronic kidney disease   . Anxiety   . GERD (gastroesophageal reflux disease)   . Sleep apnea   . COPD (chronic  obstructive pulmonary disease)   . Hypothyroidism   . Cervical disc disease   . TIA (transient ischemic attack)   . Cancer     Uteriine  ca 77yr ago partial hysterectomy  . Collagen vascular disease     RA  3-4 yrs ago  . Ascites   . NASH (nonalcoholic steatohepatitis)     PAST SURGICAL HISTORY:   Past Surgical History  Procedure Laterality Date  . Abdominal hysterectomy    . Tubal ligation    . Colonoscopy with propofol N/A 06/23/2014    Procedure: COLONOSCOPY WITH PROPOFOL;  Surgeon: MLollie Sails MD;  [facility];  Service: Endoscopy;  Laterality: N/A;  . Esophagogastroduodenoscopy N/A 06/23/2014    Procedure: ESOPHAGOGASTRODUODENOSCOPY (EGD);  Surgeon: MLollie Sails MD;  [facility];  Service: Endoscopy;  Laterality: N/A;  . Cholecystectomy N/A 07/15/2014    Procedure: LAPAROSCOPIC CHOLECYSTECTOMY with liver biopsy ;  Surgeon: MSherri Rad MD;  Location: [facility] ORS;  Service: General;  Laterality: N/A;  . Ercp N/A 07/16/2014    Procedure: ENDOSCOPIC RETROGRADE CHOLANGIOPANCREATOGRAPHY (ERCP);  Surgeon: MClarene Essex MD;  [facility];  Service: Endoscopy;  Laterality: N/A;    SOCIAL HISTORY:   Social History  Substance Use Topics  . Smoking status: Never Smoker   . Smokeless tobacco: Never Used  . Alcohol Use: No     Comment: occ    FAMILY HISTORY:   Family History  Problem Relation Age of Onset  . Lung cancer Father   . Ulcers Father   . Heart disease Sister   . Ulcers Sister   . Heart disease Brother     DRUG ALLERGIES:   Allergies  Allergen Reactions  . Vicodin [Hydrocodone-Acetaminophen] Itching and Rash    MEDICATIONS AT HOME:   Prior to Admission medications   Medication Sig Start Date End Date Taking? Authorizing Provider  albuterol (PROVENTIL HFA;VENTOLIN HFA) 108 (90 BASE) MCG/ACT inhaler Inhale 2 puffs into the lungs every 4 (four) hours as needed for wheezing or shortness of breath.   Yes Historical  Provider, MD  ALPRAZolam Duanne Moron) 1 MG tablet Take 1 tablet (1 mg total) by mouth 3 (three) times daily as needed for anxiety. Patient taking differently: Take 2 mg by mouth 3 (three) times daily.  08/06/14  Yes Sherri Rad, MD  amoxicillin-clavulanate (AUGMENTIN) 875-125 MG per tablet Take 1 tablet by mouth 2 (two) times daily. 08/30/14  Yes Florene Glen, MD  aspirin EC 81 MG EC tablet Take 1 tablet (81 mg total) by mouth daily. 08/29/14  Yes Florene Glen, MD  citalopram (CELEXA) 20 MG tablet Take 20 mg by mouth at bedtime.    Yes Historical Provider, MD  clotrimazole (MYCELEX) 10 MG troche Take 10 mg by mouth 4 (four) times daily as needed (for dry mouth).    Yes Historical Provider, MD  fluticasone (FLONASE) 50 MCG/ACT nasal spray Place 1 spray into both nostrils daily as needed for rhinitis.    Yes Historical Provider, MD  furosemide (LASIX) 40 MG tablet Take 80 mg by mouth 2 (two) times daily.    Yes Historical Provider, MD  gabapentin (NEURONTIN) 300 MG capsule Take 300 mg by mouth 4 (four) times daily as needed (for nerve pain).   Yes Historical Provider, MD  hydrochlorothiazide (HYDRODIURIL) 25 MG tablet Take 12.5 mg by mouth daily as needed (when diastolic BP is greater than 60).   Yes Historical Provider, MD  ipratropium (ATROVENT HFA) 17 MCG/ACT inhaler Inhale 2 puffs into the lungs every 8 (eight) hours.   Yes Historical Provider, MD  metFORMIN (GLUCOPHAGE) 1000 MG tablet Take 500-1,000 mg by mouth 2 (two) times daily as needed (for high blood sugar).   Yes Historical Provider, MD  omeprazole (PRILOSEC) 20 MG capsule Take 40 mg by mouth 2 (two) times daily as needed (for acid reflux).    Yes Historical Provider, MD  oxyCODONE-acetaminophen (PERCOCET/ROXICET) 5-325 MG per tablet Take 2 tablets by mouth every 4 (four) hours as needed for severe pain. 09/02/14  Yes Dia Crawford III, MD  OXYGEN Inhale 1.5-2 L into the lungs as needed (Titrate to keep SpO2 between 90-94%).   Yes Historical  Provider, MD  rizatriptan (MAXALT) 10 MG tablet Take 10 mg by mouth as needed for migraine.    Yes Historical Provider, MD    REVIEW OF SYSTEMS:  Review of Systems  Constitutional: Positive for fever (reportedly measured at home, no fever measured here at this time.) and malaise/fatigue. Negative for chills and weight loss.  HENT: Negative for ear pain, hearing loss and tinnitus.   Eyes: Negative for blurred vision, double vision, pain and redness.  Respiratory: Positive for shortness of breath. Negative for cough and hemoptysis.   Cardiovascular: Negative for chest pain, palpitations, orthopnea and leg swelling.  Gastrointestinal: Positive for nausea, abdominal pain and diarrhea. Negative for vomiting, constipation and blood in stool.  Genitourinary: Negative for dysuria, frequency and hematuria.  Musculoskeletal:  Negative for back pain, joint pain and neck pain.  Skin:       No acne, rash, or lesions  Neurological: Negative for dizziness, tremors, focal weakness and weakness.  Endo/Heme/Allergies: Negative for polydipsia. Does not bruise/bleed easily.  Psychiatric/Behavioral: Negative for depression. The patient is not nervous/anxious and does not have insomnia.      VITAL SIGNS:   Filed Vitals:   09/04/14 2331 09/05/14 0005 09/05/14 0036 09/05/14 0100  BP: 119/83 119/79 109/79 106/72  Pulse: 91 88 86 88  Temp:      TempSrc:      Resp: 15 20 21 20   Height:      Weight:      SpO2: 95% 95% 95% 94%   Wt Readings from Last 3 Encounters:  09/04/14 109.77 kg (242 lb)  09/02/14 109.77 kg (242 lb)  08/25/14 109.77 kg (242 lb)    PHYSICAL EXAMINATION:  Physical Exam  Vitals reviewed. Constitutional: She is oriented to person, place, and time. She appears well-developed. She appears distressed (mild).  HENT:  Head: Normocephalic and atraumatic.  Mouth/Throat: Oropharynx is clear and moist.  Eyes: Conjunctivae and EOM are normal. Pupils are equal, round, and reactive to light.  No scleral icterus.  Neck: Normal range of motion. Neck supple. No JVD present. No thyromegaly present.  Cardiovascular: Normal rate, regular rhythm and intact distal pulses.  Exam reveals no gallop and no friction rub.   No murmur heard. Respiratory: Effort normal and breath sounds normal. No respiratory distress. She has no wheezes. She has no rales.  GI: Soft. She exhibits distension (Moderate). There is tenderness (Diffuse).  Hypoactive bowel sounds  Musculoskeletal: Normal range of motion. She exhibits no edema.  No arthritis, no gout  Lymphadenopathy:    She has no cervical adenopathy.  Neurological: She is alert and oriented to person, place, and time. No cranial nerve deficit.  No dysarthria, no aphasia  Skin: Skin is warm and dry. No rash noted. No erythema.  Psychiatric: She has a normal mood and affect. Her behavior is normal. Judgment and thought content normal.    LABORATORY PANEL:   CBC  Recent Labs Lab 09/04/14 2123  WBC 7.4  HGB 11.1*  HCT 33.6*  PLT 210   ------------------------------------------------------------------------------------------------------------------  Chemistries   Recent Labs Lab 09/04/14 2123  NA 138  K 2.6*  CL 96*  CO2 30  GLUCOSE 111*  BUN 8  CREATININE 0.62  CALCIUM 8.6*  AST 34  ALT 12*  ALKPHOS 54  BILITOT 0.8   ------------------------------------------------------------------------------------------------------------------  Cardiac Enzymes  Recent Labs Lab 09/04/14 2123  TROPONINI <0.03   ------------------------------------------------------------------------------------------------------------------  RADIOLOGY:  Ct Abdomen Pelvis W Contrast  09/04/2014   CLINICAL DATA:  Increasing abdominal girth and weight gain, with fever. Diffuse abdominal pain. Recent cholecystectomy and bile leak. Initial encounter.  EXAM: CT ABDOMEN AND PELVIS WITH CONTRAST  TECHNIQUE: Multidetector CT imaging of the abdomen and pelvis  was performed using the standard protocol following bolus administration of intravenous contrast.  CONTRAST:  142m OMNIPAQUE IOHEXOL 300 MG/ML  SOLN  COMPARISON:  CT of the abdomen and pelvis performed 08/29/2014  FINDINGS: A small left pleural effusion is noted, with associated atelectasis.  Moderate to large volume ascites within the abdomen and pelvis appears mildly worsened from the prior study.  Fluid at the gallbladder fossa is slightly less apparent than on the prior study. There is no evidence of recurrent bile leak. Scattered clips are seen about the gallbladder fossa. The spleen is enlarged,  measuring 18.4 cm in length. Hypodensities within the left hepatic lobe measure up to 1.9 cm in size, nonspecific in nature. The mildly nodular contour of the liver raises concern for hepatic cirrhosis.  The pancreas and adrenal glands are grossly unremarkable. A pancreatic duct stent is noted ending at the second segment of the duodenum.  The kidneys are unremarkable in appearance. There is no evidence of hydronephrosis. No renal or ureteral stones are seen. No perinephric stranding is appreciated.  No free fluid is identified. The small bowel is unremarkable in appearance. The stomach is within normal limits. No acute vascular abnormalities are seen.  Vague nonspecific edema is noted within the omentum. This may be related to the patient's cirrhosis.  The appendix is normal in caliber, without evidence of appendicitis. The colon is largely decompressed and is unremarkable in appearance.  The bladder is mildly distended and grossly unremarkable. The patient is status post hysterectomy. No suspicious adnexal masses are seen. No inguinal lymphadenopathy is seen.  No acute osseous abnormalities are identified.  IMPRESSION: 1. Moderate to large volume ascites within the abdomen and pelvis is mildly increased from the prior study. 2. No evidence of recurrent bile leak. 3. Findings suggestive of hepatic cirrhosis.  Underlying splenomegaly noted. Nonspecific hypodensities within the left hepatic lobe measure up to 1.9 cm in size. 4. Vague nonspecific soft tissue edema within the omentum. This may be related to the patient's cirrhosis. 5. Small left pleural effusion noted, with associated atelectasis.   Electronically Signed   By: Garald Balding M.D.   On: 09/04/2014 23:52   US Abdomen Limited  09/03/2014   CLINICAL DATA:  Abdominal pain for 2 weeks, history of cirrhosis, cholecystectomy  EXAM: LIMITED ABDOMEN ULTRASOUND FOR ASCITES  TECHNIQUE: Limited ultrasound survey for ascites was performed in all four abdominal quadrants.  COMPARISON:  08/29/2014  FINDINGS: There is a moderate volume of ascites in the abdomen and pelvis.  IMPRESSION: Abdominal ascites identified. Volume is similar when compared to 08/29/2014 CT scan.   Electronically Signed   By: Skipper Cliche M.D.   On: 09/03/2014 09:34    EKG:   Orders placed or performed during the hospital encounter of 09/04/14  . ED EKG  . ED EKG  . EKG 12-Lead  . EKG 12-Lead    IMPRESSION AND PLAN:  Principal Problem:   Abdominal pain - likely related to her ascites, some concern by ED staff for SBP given her reported fevers at home, though no clear objective evidence for this at this time with a normal white count and no other strong signs of infection. We'll refrain from administering antibiotics at this time, hopefully deferring until she can have paracentesis with ascitic fluid sent for cell count and cultures prior to antibiotic administration. If she develops a fever, worsening symptoms, or for white count goes up, she has other signs of infection prior to this we'll start IV antibiotics immediately. Active Problems:   Ascites - seems to be slowly progressing, no diagnosis of full cirrhosis, but she does have a diagnosis of NASH as below. Diagnostic tap tomorrow may also be worthwhile to pull some fluid for therapeutic effect. Defer to GI recommendations as  below   NASH (nonalcoholic steatohepatitis) - consult GI for further recommendations, avoid hepatotoxins   Type 2 diabetes mellitus - sliding scale insulin with carb modified diet, hold metformin for now status post contrast   COPD (chronic obstructive pulmonary disease) - continue home meds   HTN (hypertension) - currently controlled,  continue home meds   OSA on CPAP - CPAP daily at bedtime   Hypokalemia - replace appropriately, check magnesium level   GERD (gastroesophageal reflux disease) - home dose PPI  All the records are reviewed and case discussed with ED provider. Management plans discussed with the patient and/or family.  DVT PROPHYLAXIS: SubQ lovenox  ADMISSION STATUS: Observation  CODE STATUS: Full Advance Directive Documentation        Most Recent Value   Type of Advance Directive  Living will   Pre-existing out of facility DNR order (yellow form or pink MOST form)     "MOST" Form in Place?        TOTAL TIME TAKING CARE OF THIS PATIENT: 45 minutes.    Lori Mckenzie 09/05/2014, 1:06 AM  [facility]  Office  [phone]  CC: Primary care physician; Ashkin, Neldon Labella, MD

## 2014-09-05 NOTE — Consult Note (Signed)
Patient ID: Rosena Bartle, female   DOB: 11/07/1965, 49 y.o.   MRN: [id number]  HPI Pricsilla Lindvall is a 49 y.o. female with worsening ascites  HPI 49 year old female well-known to the surgical group due to a complicated postoperative course after laparoscopic cholecystectomy that was performed on 07/15/2014. She had a postoperative bile leak which required drainage followed by ERCP with common duct stenting. The drain was able to be removed without difficulty however she has since developed significant ascites and has had multiple visits due to abdominal pain and abdominal girth. Patient has been seen by the members of group repeatedly due to this. Patient reports that since her last visit the clinic which was 2 days ago she has noted an increase in her abdominal size and return of her abdominal pain. She has on numerous occasions been overmedicated while going through the process she also reports a fever at home however she has been afebrile since reporting to the emergency department. She has been recommended by surgery numerous times to get workup for her ascites. No other complaints at time of my interview.  Past Medical History  Diagnosis Date  . Brittle bone disease   . Asthma   . Thyroid disease   . Hypertension   . Back pain   . Diabetes mellitus without complication   . Chronic kidney disease   . Anxiety   . GERD (gastroesophageal reflux disease)   . Sleep apnea   . COPD (chronic obstructive pulmonary disease)   . Hypothyroidism   . Cervical disc disease   . TIA (transient ischemic attack)   . Cancer     Uteriine  ca 20yr ago partial hysterectomy  . Collagen vascular disease     RA  3-4 yrs ago    Past Surgical History  Procedure Laterality Date  . Abdominal hysterectomy    . Tubal ligation    . Colonoscopy with propofol N/A 06/23/2014    Procedure: COLONOSCOPY WITH PROPOFOL;  Surgeon: MLollie Sails MD;  [facility]  Service: Endoscopy;  Laterality: N/A;  .  Esophagogastroduodenoscopy N/A 06/23/2014    Procedure: ESOPHAGOGASTRODUODENOSCOPY (EGD);  Surgeon: MLollie Sails MD;  [facility]  Service: Endoscopy;  Laterality: N/A;  . Cholecystectomy N/A 07/15/2014    Procedure: LAPAROSCOPIC CHOLECYSTECTOMY with liver biopsy ;  Surgeon: MSherri Rad MD;  Location: [facility] ORS;  Service: General;  Laterality: N/A;  . Ercp N/A 07/16/2014    Procedure: ENDOSCOPIC RETROGRADE CHOLANGIOPANCREATOGRAPHY (ERCP);  Surgeon: MClarene Essex MD;  [facility];  Service: Endoscopy;  Laterality: N/A;    Family History  Problem Relation Age of Onset  . Lung cancer Father   . Ulcers Father   . Heart disease Sister   . Ulcers Sister   . Heart disease Brother     Social History Social History  Substance Use Topics  . Smoking status: Never Smoker   . Smokeless tobacco: Never Used  . Alcohol Use: No     Comment: occ    Allergies  Allergen Reactions  . Vicodin [Hydrocodone-Acetaminophen] Itching and Rash    Current Facility-Administered Medications  Medication Dose Route Frequency Provider Last Rate Last Dose  . potassium chloride 10 mEq in 100 mL IVPB  10 mEq Intravenous Once PNena Polio MD 100 mL/hr at 09/04/14 2354 10 mEq at 09/04/14 2354   Current Outpatient Prescriptions  Medication Sig Dispense Refill  . albuterol (PROVENTIL HFA;VENTOLIN HFA) 108 (90 BASE) MCG/ACT inhaler Inhale 2 puffs into the lungs every  4 (four) hours as needed for wheezing or shortness of breath.    . ALPRAZolam (XANAX) 1 MG tablet Take 1 tablet (1 mg total) by mouth 3 (three) times daily as needed for anxiety. (Patient taking differently: Take 2 mg by mouth 3 (three) times daily. ) 30 tablet 0  . amoxicillin-clavulanate (AUGMENTIN) 875-125 MG per tablet Take 1 tablet by mouth 2 (two) times daily. 14 tablet 0  . aspirin EC 81 MG EC tablet Take 1 tablet (81 mg total) by mouth daily. 60 tablet 0  . citalopram (CELEXA) 20 MG tablet Take 20 mg by mouth at bedtime.      . clotrimazole (MYCELEX) 10 MG troche Take 10 mg by mouth 4 (four) times daily as needed (for dry mouth).     . fluticasone (FLONASE) 50 MCG/ACT nasal spray Place 1 spray into both nostrils daily as needed for rhinitis.     . furosemide (LASIX) 40 MG tablet Take 80 mg by mouth 2 (two) times daily.     Marland Kitchen gabapentin (NEURONTIN) 300 MG capsule Take 300 mg by mouth 4 (four) times daily as needed (for nerve pain).    . hydrochlorothiazide (HYDRODIURIL) 25 MG tablet Take 12.5 mg by mouth daily as needed (when diastolic BP is greater than 60).    Marland Kitchen ipratropium (ATROVENT HFA) 17 MCG/ACT inhaler Inhale 2 puffs into the lungs every 8 (eight) hours.    . metFORMIN (GLUCOPHAGE) 1000 MG tablet Take 500-1,000 mg by mouth 2 (two) times daily as needed (for high blood sugar).    Marland Kitchen omeprazole (PRILOSEC) 20 MG capsule Take 40 mg by mouth 2 (two) times daily as needed (for acid reflux).     Marland Kitchen oxyCODONE-acetaminophen (PERCOCET/ROXICET) 5-325 MG per tablet Take 2 tablets by mouth every 4 (four) hours as needed for severe pain. 20 tablet 0  . OXYGEN Inhale 1.5-2 L into the lungs as needed (Titrate to keep SpO2 between 90-94%).    . rizatriptan (MAXALT) 10 MG tablet Take 10 mg by mouth as needed for migraine.     . [DISCONTINUED] SUMAtriptan (IMITREX) 50 MG tablet Take 50 mg by mouth every 2 (two) hours as needed. For migraines       Review of Systems A multi-point review of systems was asked and was negative except for the following positive findings: Fever, lack of appetite, abdominal pain.  Physical Exam Blood pressure 119/79, pulse 88, temperature 99.4 F (37.4 C), temperature source Oral, resp. rate 20, height 5' 2"  (1.575 m), weight 109.77 kg (242 lb), SpO2 95 %. CONSTITUTIONAL: Drowsy, however easily arousable.Marland Kitchen EYES: Pupils are equal, round, and reactive to light, Sclera are non-icteric. EARS, NOSE, MOUTH AND THROAT: The oropharynx is clear. The oral mucosa is pink and moist. Hearing is intact to  voice. LYMPH NODES:  Lymph nodes in the neck are normal. RESPIRATORY:  Lungs are with distant lung sounds. There is normal respiratory effort, with equal breath sounds bilaterally, and without pathologic use of accessory muscles. CARDIOVASCULAR: Heart is distant but with regular without murmurs, gallops, or rubs. GI: The abdomen is very obese, soft, but distended. She reports pain and tenderness on exam however is inconsistent in location and quality. There are well approximated and well-healed laparoscopic incision sites. No evidence of erythema, no evidence of peritonitis. There is a palpable fluid wave on exam. There are no palpable masses. GU: Rectal deferred.   MUSCULOSKELETAL: Normal muscle strength and tone. No cyanosis or edema.   SKIN: Turgor is good and there are  no pathologic skin lesions or ulcers. NEUROLOGIC: Motor and sensation is grossly normal. Cranial nerves are grossly intact. PSYCH:  Oriented to person, place and time. Affect is normal.  Data Reviewed Labs and images personally reviewed. CT scan with shows worsening ascites however no evidence of abscess or biloma. Labs are significant for normal white blood cell count however she does have a hypokalemia with a potassium of 2.7. I have personally reviewed the patient's imaging, laboratory findings and medical records.    Assessment    49 year old female with worsening ascites and abdominal pain.    Plan    Patient presents to the emergency department with similar complaints from her last admission 1 week ago. She continues to have normal labs and imaging without any signs of abscess formation. As was discussed with her in the outpatient setting by my partners she is in need of workup of her new onset and worsening abdominal ascites. There is no current surgical indication for this patient as she is almost 2 months status post lap scopic cholecystectomy and there is no persistent drainage or evidence of abscess. Recommend  admission by medicine with GI consultation. Surgery will follow for now however there are no current surgical needs.     Time spent with the patient was 30 minutes, with more than 50% of the time spent in face-to-face education, counseling and care coordination.     Clayburn Pert 09/05/2014, 12:17 AM

## 2014-09-05 NOTE — ED Provider Notes (Signed)
I assumed care of the patient 11:00 PM from Dr. Rip Harbour. CT scan of the abdomen and pelvis revealed: CT Abdomen Pelvis W Contrast (Final result) Result time: 09/04/14 23:52:01   Final result by Rad Results In Interface (09/04/14 23:52:01)   Narrative:   CLINICAL DATA: Increasing abdominal girth and weight gain, with fever. Diffuse abdominal pain. Recent cholecystectomy and bile leak. Initial encounter.  EXAM: CT ABDOMEN AND PELVIS WITH CONTRAST  TECHNIQUE: Multidetector CT imaging of the abdomen and pelvis was performed using the standard protocol following bolus administration of intravenous contrast.  CONTRAST: 184m OMNIPAQUE IOHEXOL 300 MG/ML SOLN  COMPARISON: CT of the abdomen and pelvis performed 08/29/2014  FINDINGS: A small left pleural effusion is noted, with associated atelectasis.  Moderate to large volume ascites within the abdomen and pelvis appears mildly worsened from the prior study.  Fluid at the gallbladder fossa is slightly less apparent than on the prior study. There is no evidence of recurrent bile leak. Scattered clips are seen about the gallbladder fossa. The spleen is enlarged, measuring 18.4 cm in length. Hypodensities within the left hepatic lobe measure up to 1.9 cm in size, nonspecific in nature. The mildly nodular contour of the liver raises concern for hepatic cirrhosis.  The pancreas and adrenal glands are grossly unremarkable. A pancreatic duct stent is noted ending at the second segment of the duodenum.  The kidneys are unremarkable in appearance. There is no evidence of hydronephrosis. No renal or ureteral stones are seen. No perinephric stranding is appreciated.  No free fluid is identified. The small bowel is unremarkable in appearance. The stomach is within normal limits. No acute vascular abnormalities are seen.  Vague nonspecific edema is noted within the omentum. This may be related to the patient's cirrhosis.  The  appendix is normal in caliber, without evidence of appendicitis. The colon is largely decompressed and is unremarkable in appearance.  The bladder is mildly distended and grossly unremarkable. The patient is status post hysterectomy. No suspicious adnexal masses are seen. No inguinal lymphadenopathy is seen.  No acute osseous abnormalities are identified.  IMPRESSION: 1. Moderate to large volume ascites within the abdomen and pelvis is mildly increased from the prior study. 2. No evidence of recurrent bile leak. 3. Findings suggestive of hepatic cirrhosis. Underlying splenomegaly noted. Nonspecific hypodensities within the left hepatic lobe measure up to 1.9 cm in size. 4. Vague nonspecific soft tissue edema within the omentum. This may be related to the patient's cirrhosis. 5. Small left pleural effusion noted, with associated atelectasis.   Electronically Signed By: JGarald BaldingM.D. On: 09/04/2014 23:52        ECG Results    Given no evidence of bile leak patient was discussed with Dr. WSanda Kleinsurgeon on call who evaluated patient in the emergency department stating that there is no surgical intervention needed at this time. Patient will however be admitted to the internal medicine staff for worsening ascites with concern of spontaneous bacterial peritonitis given worsening ascites with fever history at home. Patient discussed with Dr. ELissa Merlinfor hospital admission.  RGregor Hams MD 09/05/14 [phone]

## 2014-09-05 NOTE — Consult Note (Signed)
  Pt seen and examined. Please see C. Celesta Aver' notes. Pt with persistent bacterial peritonitis. Had infected ascites 2 weeks ago, for which pt was briefly treated with augmentin. WBC of ascitic fluid still elevated. Now on cipro. Will likely need at least 2-4 weeks of Abx coverage to eradicate the infection. Unclear why she has significant ascites, when she supposedly has NASH on liver bx alone. Consider ID involvement if infection does not clear. Will await culture results. Can arrange biliary stent removal sometime next week. Would like to get this infection/pain under control 1st. Thanks

## 2014-09-06 ENCOUNTER — Encounter: Payer: Self-pay | Admitting: Gastroenterology

## 2014-09-06 LAB — GLUCOSE, CAPILLARY
Glucose-Capillary: 118 mg/dL — ABNORMAL HIGH (ref 65–99)
Glucose-Capillary: 116 mg/dL — ABNORMAL HIGH (ref 65–99)
Glucose-Capillary: 111 mg/dL — ABNORMAL HIGH (ref 65–99)
Glucose-Capillary: 136 mg/dL — ABNORMAL HIGH (ref 65–99)

## 2014-09-06 LAB — URINE CULTURE: Special Requests: NORMAL

## 2014-09-06 LAB — BASIC METABOLIC PANEL
Anion gap: 6 (ref 5–15)
BUN: 7 mg/dL (ref 6–20)
CO2: 33 mmol/L — ABNORMAL HIGH (ref 22–32)
Calcium: 7.9 mg/dL — ABNORMAL LOW (ref 8.9–10.3)
Chloride: 98 mmol/L — ABNORMAL LOW (ref 101–111)
Creatinine, Ser: 0.63 mg/dL (ref 0.44–1.00)
GFR calc Af Amer: >60 mL/min (ref >60)
GFR calc non Af Amer: >60 mL/min (ref >60)
Glucose, Bld: 103 mg/dL — ABNORMAL HIGH (ref 65–99)
Potassium: 3.7 mmol/L (ref 3.5–5.1)
Sodium: 137 mmol/L (ref 135–145)

## 2014-09-06 MED ORDER — SPIRONOLACTONE 25 MG PO TABS
50.0000 mg | ORAL_TABLET | Freq: Two times a day (BID) | ORAL | Status: DC
  Administered 2014-09-07 (×2): 50 mg via ORAL
  Filled 2014-09-06 (×4): qty 2

## 2014-09-06 NOTE — Consult Note (Signed)
  GI Inpatient Follow-up Note  Patient Identification: Lori Mckenzie is a 49 y.o. female with chronic abdominal pain, recurrent ascites, and bacterial peritonitis.  Subjective: Overall abdominal pain better though prefer getting dilaudid q 3hrs, instead of q 4hrs. Could not locate liver biopsy results. However, CT suggests nodular liver, consistent with liver cirrhosis.  Scheduled Inpatient Medications:  . aspirin EC  81 mg Oral Daily  . ciprofloxacin  400 mg Intravenous Q12H  . citalopram  20 mg Oral QHS  . enoxaparin (LOVENOX) injection  40 mg Subcutaneous Q12H  . furosemide  80 mg Oral BID  . insulin aspart  0-5 Units Subcutaneous QHS  . insulin aspart  0-9 Units Subcutaneous TID WC  . ipratropium  2.5 mL Inhalation TID  . pantoprazole  40 mg Oral BID AC  . potassium chloride  30 mEq Oral Daily  . sodium chloride  3 mL Intravenous Q12H    Continuous Inpatient Infusions:     PRN Inpatient Medications:  acetaminophen **OR** acetaminophen, ALPRAZolam, gabapentin, hydrochlorothiazide, HYDROmorphone (DILAUDID) injection, ondansetron **OR** ondansetron (ZOFRAN) IV  Review of Systems: Constitutional: Weight is stable.  Eyes: No changes in vision. ENT: No oral lesions, sore throat.  GI: see HPI.  Heme/Lymph: No easy bruising.  CV: No chest pain.  GU: No hematuria.  Integumentary: No rashes.  Neuro: No headaches.  Psych: No depression/anxiety.  Endocrine: No heat/cold intolerance.  Allergic/Immunologic: No urticaria.  Resp: No cough, SOB.  Musculoskeletal: No joint swelling.    Physical Examination: BP 93/51 mmHg  Pulse 87  Temp(Src) 98.1 F (36.7 C) (Oral)  Resp 18  Ht 5' 2"  (1.575 m)  Wt 109.725 kg (241 lb 14.4 oz)  BMI 44.23 kg/m2  SpO2 92% Gen: NAD, alert and oriented x 4 HEENT: PEERLA, EOMI, Neck: supple, no JVD or thyromegaly Chest: CTA bilaterally, no wheezes, crackles, or other adventitious sounds CV: RRR, no m/g/c/r Abd: soft,mild diffuse abdominal  tenderness, ND, +BS in all four quadrants; no HSM, guarding, ridigity, or rebound tenderness Ext: no edema, well perfused with 2+ pulses, Skin: no rash or lesions noted Lymph: no LAD  Data: Lab Results  Component Value Date   WBC 5.0 09/05/2014   HGB 9.5* 09/05/2014   HCT 28.9* 09/05/2014   MCV 89.6 09/05/2014   PLT 168 09/05/2014    Recent Labs Lab 09/04/14 2123 09/05/14 0459  HGB 11.1* 9.5*   Lab Results  Component Value Date   NA 137 09/06/2014   K 3.7 09/06/2014   CL 98* 09/06/2014   CO2 33* 09/06/2014   BUN 7 09/06/2014   CREATININE 0.63 09/06/2014   Lab Results  Component Value Date   ALT 12* 09/04/2014   AST 34 09/04/2014   ALKPHOS 54 09/04/2014   BILITOT 0.8 09/04/2014   No results for input(s): APTT, INR, PTT in the last 168 hours. Assessment/Plan: [name] is a 49 y.o. female with probable liver cirrhosis, based on CT and labs. Ascites. Bacterial peritonitis.  Recommendations: Continue Abx for minimum 2 weeks or more, depending on cx and sensitivities. On lasix. Recommend adding aldactone to keep ascites under control. Pain control will be a major issue before we can discharge pt. Will follow.  Thanks. Please call with questions or concerns.  Cyril Woodmansee, Lupita Dawn, MD

## 2014-09-06 NOTE — Progress Notes (Signed)
[facility] [place] NAME: Lori Mckenzie    MR#:  [id number]  DATE OF BIRTH:  13-Feb-1965  SUBJECTIVE: 49 year old female patient came in with severe abdominal pain. She also noted to have fever up to 101 Fahrenheit at home. Patient had about 5 L of forearm ascites fluid drying the yesterday. She feels better today. Started on Cipro for a SBP. Patient is more concentrated on  getting IV pain medications today and she was requesting IV Dilaudid the 2 mg every 3 hours. She also requesting the to see pain management specialist.   CHIEF COMPLAINT:   Chief Complaint  Patient presents with  . Post-op Problem    REVIEW OF SYSTEMS:   Review of Systems  Constitutional: Negative for fever and chills.  HENT: Negative for hearing loss.   Eyes: Negative for blurred vision, double vision and photophobia.  Respiratory: Negative for cough, hemoptysis and shortness of breath.   Cardiovascular: Negative for palpitations, orthopnea and leg swelling.  Gastrointestinal: Positive for abdominal pain. Negative for nausea, vomiting and diarrhea.       Ascites, generalized abdominal tenderness present, bowel sounds diminished.  Genitourinary: Negative for dysuria and urgency.  Musculoskeletal: Negative for myalgias and neck pain.  Skin: Negative for rash.  Neurological: Negative for dizziness, focal weakness, seizures, weakness and headaches.  Psychiatric/Behavioral: Negative for memory loss. The patient does not have insomnia.      DRUG ALLERGIES:   Allergies  Allergen Reactions  . Vicodin [Hydrocodone-Acetaminophen] Itching and Rash    VITALS:  Blood pressure 93/51, pulse 87, temperature 98.1 F (36.7 C), temperature source Oral, resp. rate 18, height 5' 2"  (1.575 m), weight 109.725 kg (241 lb 14.4 oz), SpO2 92 %.  PHYSICAL EXAMINATION:  GENERAL:  49 y.o.-year-old patient lying in the bed with no acute distress.  EYES: Pupils equal, round, reactive to  light and accommodation. No scleral icterus. Extraocular muscles intact.  HEENT: Head atraumatic, normocephalic. Oropharynx and nasopharynx clear.  NECK:  Supple, no jugular venous distention. No thyroid enlargement, no tenderness.  LUNGS: Normal breath sounds bilaterally, no wheezing, rales,rhonchi or crepitation. No use of accessory muscles of respiration.  CARDIOVASCULAR: S1, S2 normal. No murmurs, rubs, or gallops.  ABDOMEN: Generalized abdominal tenderness present decreased bowel sounds, ascites and fluid thrill present EXTREMITIES: No pedal edema, cyanosis, or clubbing.  NEUROLOGIC: Cranial nerves II through XII are intact. Muscle strength 5/5 in all extremities. Sensation intact. Gait not checked.  PSYCHIATRIC: The patient is alert and oriented x 3.  SKIN: No obvious rash, lesion, or ulcer.    LABORATORY PANEL:   CBC  Recent Labs Lab 09/05/14 0459  WBC 5.0  HGB 9.5*  HCT 28.9*  PLT 168   ------------------------------------------------------------------------------------------------------------------  Chemistries   Recent Labs Lab 09/04/14 2123  09/06/14 0711  NA 138  < > 137  K 2.6*  < > 3.7  CL 96*  < > 98*  CO2 30  < > 33*  GLUCOSE 111*  < > 103*  BUN 8  < > 7  CREATININE 0.62  < > 0.63  CALCIUM 8.6*  < > 7.9*  MG 2.0  --   --   AST 34  --   --   ALT 12*  --   --   ALKPHOS 54  --   --   BILITOT 0.8  --   --   < > = values in this interval not displayed. ------------------------------------------------------------------------------------------------------------------  Cardiac Enzymes  Recent Labs Lab 09/04/14 2123  TROPONINI <0.03   ------------------------------------------------------------------------------------------------------------------  RADIOLOGY:  Ct Abdomen Pelvis W Contrast  09/04/2014   CLINICAL DATA:  Increasing abdominal girth and weight gain, with fever. Diffuse abdominal pain. Recent cholecystectomy and bile leak. Initial encounter.   EXAM: CT ABDOMEN AND PELVIS WITH CONTRAST  TECHNIQUE: Multidetector CT imaging of the abdomen and pelvis was performed using the standard protocol following bolus administration of intravenous contrast.  CONTRAST:  119m OMNIPAQUE IOHEXOL 300 MG/ML  SOLN  COMPARISON:  CT of the abdomen and pelvis performed 08/29/2014  FINDINGS: A small left pleural effusion is noted, with associated atelectasis.  Moderate to large volume ascites within the abdomen and pelvis appears mildly worsened from the prior study.  Fluid at the gallbladder fossa is slightly less apparent than on the prior study. There is no evidence of recurrent bile leak. Scattered clips are seen about the gallbladder fossa. The spleen is enlarged, measuring 18.4 cm in length. Hypodensities within the left hepatic lobe measure up to 1.9 cm in size, nonspecific in nature. The mildly nodular contour of the liver raises concern for hepatic cirrhosis.  The pancreas and adrenal glands are grossly unremarkable. A pancreatic duct stent is noted ending at the second segment of the duodenum.  The kidneys are unremarkable in appearance. There is no evidence of hydronephrosis. No renal or ureteral stones are seen. No perinephric stranding is appreciated.  No free fluid is identified. The small bowel is unremarkable in appearance. The stomach is within normal limits. No acute vascular abnormalities are seen.  Vague nonspecific edema is noted within the omentum. This may be related to the patient's cirrhosis.  The appendix is normal in caliber, without evidence of appendicitis. The colon is largely decompressed and is unremarkable in appearance.  The bladder is mildly distended and grossly unremarkable. The patient is status post hysterectomy. No suspicious adnexal masses are seen. No inguinal lymphadenopathy is seen.  No acute osseous abnormalities are identified.  IMPRESSION: 1. Moderate to large volume ascites within the abdomen and pelvis is mildly increased from the  prior study. 2. No evidence of recurrent bile leak. 3. Findings suggestive of hepatic cirrhosis. Underlying splenomegaly noted. Nonspecific hypodensities within the left hepatic lobe measure up to 1.9 cm in size. 4. Vague nonspecific soft tissue edema within the omentum. This may be related to the patient's cirrhosis. 5. Small left pleural effusion noted, with associated atelectasis.   Electronically Signed   By: JGarald BaldingM.D.   On: 09/04/2014 23:52   UKoreaParacentesis  09/05/2014   CLINICAL DATA:  persistent abdominal pain. Patient had cholecystectomy done laparoscopically couple of months ago now, which was complicated by a bile leak at the time. On follow-up evaluation, the bile drain completely from her abdomen, and drains removed, and the patient has been healing routinely since. 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 Since her surgery, her ascites has continued to build more dramatically. She came to the ED today for the same plus a report of fevers at home. On evaluation in the ED today her workup was largely benign, with a normal white count and labs otherwise largely normal except for hypokalemia with potassium of 2.6. CT abdomen and pelvis does not show any signs of recurrent or persistent bile leakage or bile in her abdominal cavity. It does show significant ascites slightly greater than prior imaging. Surgery was consult is from the ED and  does not feel this issue is related to her prior surgery or that she has a surgical problem at this time, hospitalists were then called for admission was some concern for possible SBP given the report of fevers and the presence of ascites.  EXAM: ULTRASOUND GUIDED PARACENTESIS  TECHNIQUE: The procedure, risks (including but not limited to bleeding, infection, organ damage ), benefits, and alternatives were explained to the patient. Questions regarding the procedure were encouraged and  answered. The patient understands and consents to the procedure. Survey ultrasound of the abdomen was performed and an appropriate skin entry site in the right lateral abdomen was selected. Skin site was marked, prepped with chlorhexidine, and draped in usual sterile fashion, and infiltrated locally with 1% lidocaine. A Safe-T-Centesis sheath needle was advanced into the peritoneal space until fluid could be aspirated. The sheath was advanced and the needle removed. 5.78 L of clear yellowascites were aspirated. A sample sent for the requested laboratory studies. The patient tolerated the procedure well.  COMPLICATIONS: COMPLICATIONS none  IMPRESSION: Technically successful ultrasound guided paracentesis, removing 5.78 L of ascites.   Electronically Signed   By: Lucrezia Europe M.D.   On: 09/05/2014 12:53    EKG:   Orders placed or performed during the hospital encounter of 09/04/14  . ED EKG  . ED EKG  . EKG 12-Lead  . EKG 12-Lead  . EKG 12-Lead  . EKG 12-Lead    ASSESSMENT AND PLAN:   ; 1. Abdominal pain secondary to tense ascites: Patient had a paracentesis done yesterday, 5 and half liters of fluid removed. She feels better today. Follow the ascetic fluid culture results. Continue Cipro for a possible SBP.  #2: History of nonalcoholic steatohepatitis with cirrhosis.: Continue present treatment, GI consult appreciated on the Lasix, Aldactone. #3 diabetes type 2 continue sliding scale with coverage , advance her diet to ADA diet. Possibly resume metformin tomorrow.  #4 History of COPD patient has no wheezing continue home medications concerned about the possible respiratory depression with the ongoing ascites and pain medication use. #5 Hypotension likely secondary to pain medicine:  6.History of hypokalemia replace as needed 7.Hypertension right now she is hypotensive hold hypertension medications 8.Recent laparoscopic cholecystectomy , with complicated postop course with the biliary leak, CT  abdomen did not show any. Biliary leak.  9.ascites/cirrhosis/NASH' All the records are reviewed and case discussed with Care Management/Social Workerr. Management plans discussed with the patient, family and they are in agreement.  CODE STATUS: Full  TOTAL TIME TAKING CARE OF THIS PATIENT: 35 minutes.   POSSIBLE D/C IN 1-2 DAYS, DEPENDING ON CLINICAL CONDITION.   Epifanio Lesches M.D on 09/06/2014 at 9:24 AM  Between 7am to 6pm - Pager - [phone]  After 6pm go to [email or web address] - password [email or web address] [facility]  Office  [phone]  CC: Primary care physician; Ashkin, Neldon Labella, MD

## 2014-09-06 NOTE — Progress Notes (Signed)
Surgery Progress Note  S: Still c/o pain, abdominal pressure much improved O:Blood pressure 103/59, pulse 98, temperature 99.1 F (37.3 C), temperature source Oral, resp. rate 20, height 5' 2"  (1.575 m), weight 241 lb 14.4 oz (109.725 kg), SpO2 94 %. GEN: NAD/A&Ox3 ABD: soft, min tender, nondistended  Labs: Reviewed, unremarkable  A/P 49 yo s/p chole s/p stent for bile leak.  Now with recurrent ascites.  ? Decompensated cirrhosis - no acute surgical issues - appreciate GI input

## 2014-09-07 LAB — GLUCOSE, CAPILLARY
Glucose-Capillary: 129 mg/dL — ABNORMAL HIGH (ref 65–99)
Glucose-Capillary: 135 mg/dL — ABNORMAL HIGH (ref 65–99)
Glucose-Capillary: 132 mg/dL — ABNORMAL HIGH (ref 65–99)
Glucose-Capillary: 114 mg/dL — ABNORMAL HIGH (ref 65–99)

## 2014-09-07 LAB — URINALYSIS COMPLETE WITH MICROSCOPIC (ARMC ONLY)
Bilirubin Urine: NEGATIVE
Glucose, UA: NEGATIVE mg/dL
Hgb urine dipstick: NEGATIVE
Ketones, ur: NEGATIVE mg/dL
Leukocytes, UA: NEGATIVE
Nitrite: NEGATIVE
Protein, ur: NEGATIVE mg/dL
Specific Gravity, Urine: 1.011 (ref 1.005–1.030)
pH: 8 (ref 5.0–8.0)

## 2014-09-07 LAB — PROTIME-INR
INR: 1.16
Prothrombin Time: 15 seconds (ref 11.4–15.0)

## 2014-09-07 MED ORDER — DIPHENHYDRAMINE HCL 25 MG PO CAPS
25.0000 mg | ORAL_CAPSULE | Freq: Four times a day (QID) | ORAL | Status: DC | PRN
  Administered 2014-09-07: 25 mg via ORAL
  Filled 2014-09-07: qty 1

## 2014-09-07 MED ORDER — METFORMIN HCL 500 MG PO TABS
1000.0000 mg | ORAL_TABLET | Freq: Two times a day (BID) | ORAL | Status: DC
  Filled 2014-09-07 (×2): qty 2

## 2014-09-07 NOTE — Progress Notes (Signed)
[facility] NAME: Lori Mckenzie    MR#:  [id number]  DATE OF BIRTH:  1965-04-08  SUBJECTIVE: 49 year old female patient came in with severe abdominal pain. She also noted to have fever up to 101 Fahrenheit at home. Patient had about 5 L of forearm ascites fluid drying the yesterday. She feels better today. Started on Cipro for a SBP. Complains of dysuria  And dark urine/ Also thinks her ascites is coming back. Her abdominal pain is better.   CHIEF COMPLAINT:   Chief Complaint  Patient presents with  . Post-op Problem    REVIEW OF SYSTEMS:   Review of Systems  Constitutional: Negative for fever and chills.  HENT: Negative for hearing loss.   Eyes: Negative for blurred vision, double vision and photophobia.  Respiratory: Negative for cough, hemoptysis and shortness of breath.   Cardiovascular: Negative for palpitations, orthopnea and leg swelling.  Gastrointestinal: Positive for abdominal pain. Negative for nausea, vomiting and diarrhea.       Ascites, generalized abdominal tenderness present, bowel sounds diminished.  Genitourinary: Positive for dysuria. Negative for urgency.  Musculoskeletal: Negative for myalgias and neck pain.  Skin: Negative for rash.  Neurological: Negative for dizziness, focal weakness, seizures, weakness and headaches.  Psychiatric/Behavioral: Negative for memory loss. The patient does not have insomnia.      DRUG ALLERGIES:   Allergies  Allergen Reactions  . Vicodin [Hydrocodone-Acetaminophen] Itching and Rash    VITALS:  Blood pressure 95/55, pulse 87, temperature 98.4 F (36.9 C), temperature source Oral, resp. rate 17, height 5' 2"  (1.575 m), weight 109.725 kg (241 lb 14.4 oz), SpO2 94 %.  PHYSICAL EXAMINATION:  GENERAL:  49 y.o.-year-old patient lying in the bed with no acute distress.  EYES: Pupils equal, round, reactive to light and accommodation. No scleral icterus. Extraocular muscles  intact.  HEENT: Head atraumatic, normocephalic. Oropharynx and nasopharynx clear.  NECK:  Supple, no jugular venous distention. No thyroid enlargement, no tenderness.  LUNGS: Normal breath sounds bilaterally, no wheezing, rales,rhonchi or crepitation. No use of accessory muscles of respiration.  CARDIOVASCULAR: S1, S2 normal. No murmurs, rubs, or gallops.  ABDOMEN: Decreased abdominal pain and tenderness. EXTREMITIES: No pedal edema, cyanosis, or clubbing.  NEUROLOGIC: Cranial nerves II through XII are intact. Muscle strength 5/5 in all extremities. Sensation intact. Gait not checked.  PSYCHIATRIC: The patient is alert and oriented x 3.  SKIN: No obvious rash, lesion, or ulcer.    LABORATORY PANEL:   CBC  Recent Labs Lab 09/05/14 0459  WBC 5.0  HGB 9.5*  HCT 28.9*  PLT 168   ------------------------------------------------------------------------------------------------------------------  Chemistries   Recent Labs Lab 09/04/14 2123  09/06/14 0711  NA 138  < > 137  K 2.6*  < > 3.7  CL 96*  < > 98*  CO2 30  < > 33*  GLUCOSE 111*  < > 103*  BUN 8  < > 7  CREATININE 0.62  < > 0.63  CALCIUM 8.6*  < > 7.9*  MG 2.0  --   --   AST 34  --   --   ALT 12*  --   --   ALKPHOS 54  --   --   BILITOT 0.8  --   --   < > = values in this interval not displayed. ------------------------------------------------------------------------------------------------------------------  Cardiac Enzymes  Recent Labs Lab 09/04/14 2123  TROPONINI <0.03   ------------------------------------------------------------------------------------------------------------------  RADIOLOGY:  US Paracentesis  09/05/2014  CLINICAL DATA:  persistent abdominal pain. Patient had cholecystectomy done laparoscopically couple of months ago now, which was complicated by a bile leak at the time. On follow-up evaluation, the bile drain completely from her abdomen, and drains removed, and the patient has been  healing routinely since. 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 Since her surgery, her ascites has continued to build more dramatically. She came to the ED today for the same plus a report of fevers at home. On evaluation in the ED today her workup was largely benign, with a normal white count and labs otherwise largely normal except for hypokalemia with potassium of 2.6. CT abdomen and pelvis does not show any signs of recurrent or persistent bile leakage or bile in her abdominal cavity. It does show significant ascites slightly greater than prior imaging. Surgery was consult is from the ED and does not feel this issue is related to her prior surgery or that she has a surgical problem at this time, hospitalists were then called for admission was some concern for possible SBP given the report of fevers and the presence of ascites.  EXAM: ULTRASOUND GUIDED PARACENTESIS  TECHNIQUE: The procedure, risks (including but not limited to bleeding, infection, organ damage ), benefits, and alternatives were explained to the patient. Questions regarding the procedure were encouraged and answered. The patient understands and consents to the procedure. Survey ultrasound of the abdomen was performed and an appropriate skin entry site in the right lateral abdomen was selected. Skin site was marked, prepped with chlorhexidine, and draped in usual sterile fashion, and infiltrated locally with 1% lidocaine. A Safe-T-Centesis sheath needle was advanced into the peritoneal space until fluid could be aspirated. The sheath was advanced and the needle removed. 5.78 L of clear yellowascites were aspirated. A sample sent for the requested laboratory studies. The patient tolerated the procedure well.  COMPLICATIONS: COMPLICATIONS none  IMPRESSION: Technically successful ultrasound guided paracentesis, removing 5.78 L of ascites.   Electronically Signed   By: Lucrezia Europe M.D.   On: 09/05/2014 12:53    EKG:   Orders placed or performed during the hospital encounter of 09/04/14  . ED EKG  . ED EKG  . EKG 12-Lead  . EKG 12-Lead  . EKG 12-Lead  . EKG 12-Lead    ASSESSMENT AND PLAN:   ; 1. Abdominal pain secondary to tense ascites: Patient had a paracentesis done yesterday, 5 and half liters of fluid removed. She feels better today. Follow the ascetic fluid culture results. Continue Cipro for a possible SBP.  #2: History of nonalcoholic steatohepatitis with cirrhosis.: Continue present treatment, GI consult appreciated on the Lasix, Aldactone. #3 diabetes type 2 continue sliding scale with coverage , resume metformin..  #4 History of COPD patient has no wheezing continue home medications concerned about the possible respiratory depression with the ongoing ascites and pain medication use. #5 Hypotension likely secondary to pain medicine: Patient is still low.  6.History of hypokalemia replace as needed 7.Hypertension right now she is hypotensive hold hypertension medications 8.Recent laparoscopic cholecystectomy , with complicated postop course with the biliary leak, CT abdomen did not show any. Biliary leak.  9.ascites/cirrhosis/NASH'  #10 dysuria check the UA. Out of bed to chair, possible discharge tomorrow. All the records are reviewed and case discussed with Care Management/Social Workerr. Management plans discussed with the patient, family and they are in agreement.  CODE STATUS: Full  TOTAL TIME  TAKING CARE OF THIS PATIENT: 35 minutes.   POSSIBLE D/C IN 1-2 DAYS, DEPENDING ON CLINICAL CONDITION.   Epifanio Lesches M.D on 09/07/2014 at 11:45 AM  Between 7am to 6pm - Pager - [phone]  After 6pm go to [email or web address] - password [email or web address] [facility]  Office  [phone]  CC: Primary care physician; Ashkin, Neldon Labella, MD

## 2014-09-07 NOTE — Care Management Note (Signed)
Case Management Note  Patient Details  Name: Delanda Bulluck MRN: [id number] Date of Birth: [birth year]  Subjective/Objective:     Anticipate discharge tomorrow. As of today, 09/07/14, Ms [name] no longer meets Observation status. Dr Doy Hutching was updated by Ival Bible in the ED.                Action/Plan:   Expected Discharge Date:                  Expected Discharge Plan:     In-House Referral:     Discharge planning Services     Post Acute Care Choice:    Choice offered to:     DME Arranged:    DME Agency:     HH Arranged:    [place] Agency:     Status of Service:     Medicare Important Message Given:    Date Medicare IM Given:    Medicare IM give by:    Date Additional Medicare IM Given:    Additional Medicare Important Message give by:     If discussed at [place] of Stay Meetings, dates discussed:    Additional Comments:  Tauriel Scronce A, RN 09/07/2014, 6:19 PM

## 2014-09-08 LAB — BASIC METABOLIC PANEL
Anion gap: 9 (ref 5–15)
BUN: 5 mg/dL — ABNORMAL LOW (ref 6–20)
CO2: 31 mmol/L (ref 22–32)
Calcium: 8.1 mg/dL — ABNORMAL LOW (ref 8.9–10.3)
Chloride: 97 mmol/L — ABNORMAL LOW (ref 101–111)
Creatinine, Ser: 0.63 mg/dL (ref 0.44–1.00)
GFR calc Af Amer: >60 mL/min (ref >60)
GFR calc non Af Amer: >60 mL/min (ref >60)
Glucose, Bld: 119 mg/dL — ABNORMAL HIGH (ref 65–99)
Potassium: 3.7 mmol/L (ref 3.5–5.1)
Sodium: 137 mmol/L (ref 135–145)

## 2014-09-08 LAB — GLUCOSE, CAPILLARY
Glucose-Capillary: 117 mg/dL — ABNORMAL HIGH (ref 65–99)
Glucose-Capillary: 129 mg/dL — ABNORMAL HIGH (ref 65–99)

## 2014-09-08 MED ORDER — CIPROFLOXACIN HCL 500 MG PO TABS
500.0000 mg | ORAL_TABLET | Freq: Two times a day (BID) | ORAL | Status: DC
  Administered 2014-09-08: 500 mg via ORAL
  Filled 2014-09-08: qty 1

## 2014-09-08 MED ORDER — ALDACTONE 100 MG PO TABS: 100.0000 mg | ORAL_TABLET | Freq: Every day | ORAL | Status: DC

## 2014-09-08 MED ORDER — OXYCODONE HCL 10 MG PO TABS: 5.0000 mg | ORAL_TABLET | ORAL | Status: DC | PRN

## 2014-09-08 MED ORDER — CIPROFLOXACIN HCL 500 MG PO TABS: 500.0000 mg | ORAL_TABLET | Freq: Two times a day (BID) | ORAL | Status: DC

## 2014-09-08 NOTE — Progress Notes (Signed)
IV to PO conversion:    49 yo female on day 4 of Ciprofloxacin 477m IV Q12H.    Patient is eating, taking other meds by mouth, is afebrile >24 hours, and has WBC <11.5.    Will transition to Ciprofloxacin 5097mPO [id number] per policy.    KaOlivia CanterRPAcuity Specialty Hospital Of [facility]/05/2014

## 2014-09-08 NOTE — Consult Note (Signed)
  GI Inpatient Follow-up Note  Patient Identification: Lori Mckenzie is a 49 y.o. female with bacterial peritonitis and recurrent ascites.  Subjective: Dark urine yesterday but U/A improved from before. Overall abdominal pain less. Feels ascites is coming back. Scheduled Inpatient Medications:  . aspirin EC  81 mg Oral Daily  . ciprofloxacin  500 mg Oral Q12H  . citalopram  20 mg Oral QHS  . enoxaparin (LOVENOX) injection  40 mg Subcutaneous Q12H  . furosemide  80 mg Oral BID  . insulin aspart  0-5 Units Subcutaneous QHS  . insulin aspart  0-9 Units Subcutaneous TID WC  . ipratropium  2.5 mL Inhalation TID  . metFORMIN  1,000 mg Oral BID WC  . pantoprazole  40 mg Oral BID AC  . potassium chloride  30 mEq Oral Daily  . sodium chloride  3 mL Intravenous Q12H  . spironolactone  50 mg Oral BID    Continuous Inpatient Infusions:     PRN Inpatient Medications:  acetaminophen **OR** acetaminophen, ALPRAZolam, diphenhydrAMINE, gabapentin, HYDROmorphone (DILAUDID) injection, ondansetron **OR** ondansetron (ZOFRAN) IV  Review of Systems: Constitutional: Weight is stable.  Eyes: No changes in vision. ENT: No oral lesions, sore throat.  GI: see HPI.  Heme/Lymph: No easy bruising.  CV: No chest pain.  GU: No hematuria.  Integumentary: No rashes.  Neuro: No headaches.  Psych: No depression/anxiety.  Endocrine: No heat/cold intolerance.  Allergic/Immunologic: No urticaria.  Resp: No cough, SOB.  Musculoskeletal: No joint swelling.    Physical Examination: BP 104/57 mmHg  Pulse 91  Temp(Src) 98.2 F (36.8 C) (Oral)  Resp 17  Ht 5' 2"  (1.575 m)  Wt 109.725 kg (241 lb 14.4 oz)  BMI 44.23 kg/m2  SpO2 95% Gen: NAD, alert and oriented x 4 HEENT: PEERLA, EOMI, Neck: supple, no JVD or thyromegaly Chest: CTA bilaterally, no wheezes, crackles, or other adventitious sounds CV: RRR, no m/g/c/r Abd: soft,distended with ascites, diffuse tenderness, +BS in all four quadrants; no HSM,  guarding, ridigity, or rebound tenderness Ext: no edema, well perfused with 2+ pulses, Skin: no rash or lesions noted Lymph: no LAD  Data: Lab Results  Component Value Date   WBC 5.0 09/05/2014   HGB 9.5* 09/05/2014   HCT 28.9* 09/05/2014   MCV 89.6 09/05/2014   PLT 168 09/05/2014    Recent Labs Lab 09/04/14 2123 09/05/14 0459  HGB 11.1* 9.5*   Lab Results  Component Value Date   NA 137 09/08/2014   K 3.7 09/08/2014   CL 97* 09/08/2014   CO2 31 09/08/2014   BUN 5* 09/08/2014   CREATININE 0.63 09/08/2014   Lab Results  Component Value Date   ALT 12* 09/04/2014   AST 34 09/04/2014   ALKPHOS 54 09/04/2014   BILITOT 0.8 09/04/2014    Recent Labs Lab 09/07/14 0702  INR 1.16   Assessment/Plan: [name] is a 49 y.o. female with recurrent ascites. I added aldactone 51m bid to lasix 82mbid and stopped HCTZ yesterday.  Recommendations: Apparently, pt already has an GI appt this Wed. Pt can probably be discharged today on lasix/aldactone/cipro x min 14 days. May need to increase aldactone later to 10065mid if ascites keeps recurring. I will set up ERCP for pt in few weeks to remove CBD stent and to confirm complete healing of bile leak. Thanks.  Please call with questions or concerns.  Aryanna Shaver, PAULupita DawnD

## 2014-09-08 NOTE — Progress Notes (Signed)
MD paged re: pt's request for d/c.  D/c home per Dr. Vianne Bulls.

## 2014-09-08 NOTE — Progress Notes (Signed)
Order for d/c.  D/c teaching complete, AVS, Rxs f/u appt reviewed.  All questions answered; pt verbalized understanding.  Pt escorted to car w/ husband via wheelchair by NA.

## 2014-09-09 LAB — CULTURE, BLOOD (ROUTINE X 2)
Culture: NO GROWTH
Culture: NO GROWTH

## 2014-09-09 LAB — BODY FLUID CULTURE: Culture: NO GROWTH

## 2014-09-09 LAB — PATHOLOGIST SMEAR REVIEW

## 2014-09-09 IMAGING — CR DG CHEST 2V
1 series · 2 of 2 positions shown · non-contrast
Comparison: 12/10/2012

CLINICAL DATA: Shortness of breath.  Chest pain.  COPD.

EXAM:
CHEST  2 VIEW

[Series 1: w chest pa · 0.14mm/px · 2 of 2 slices shown]
[im 1/2]
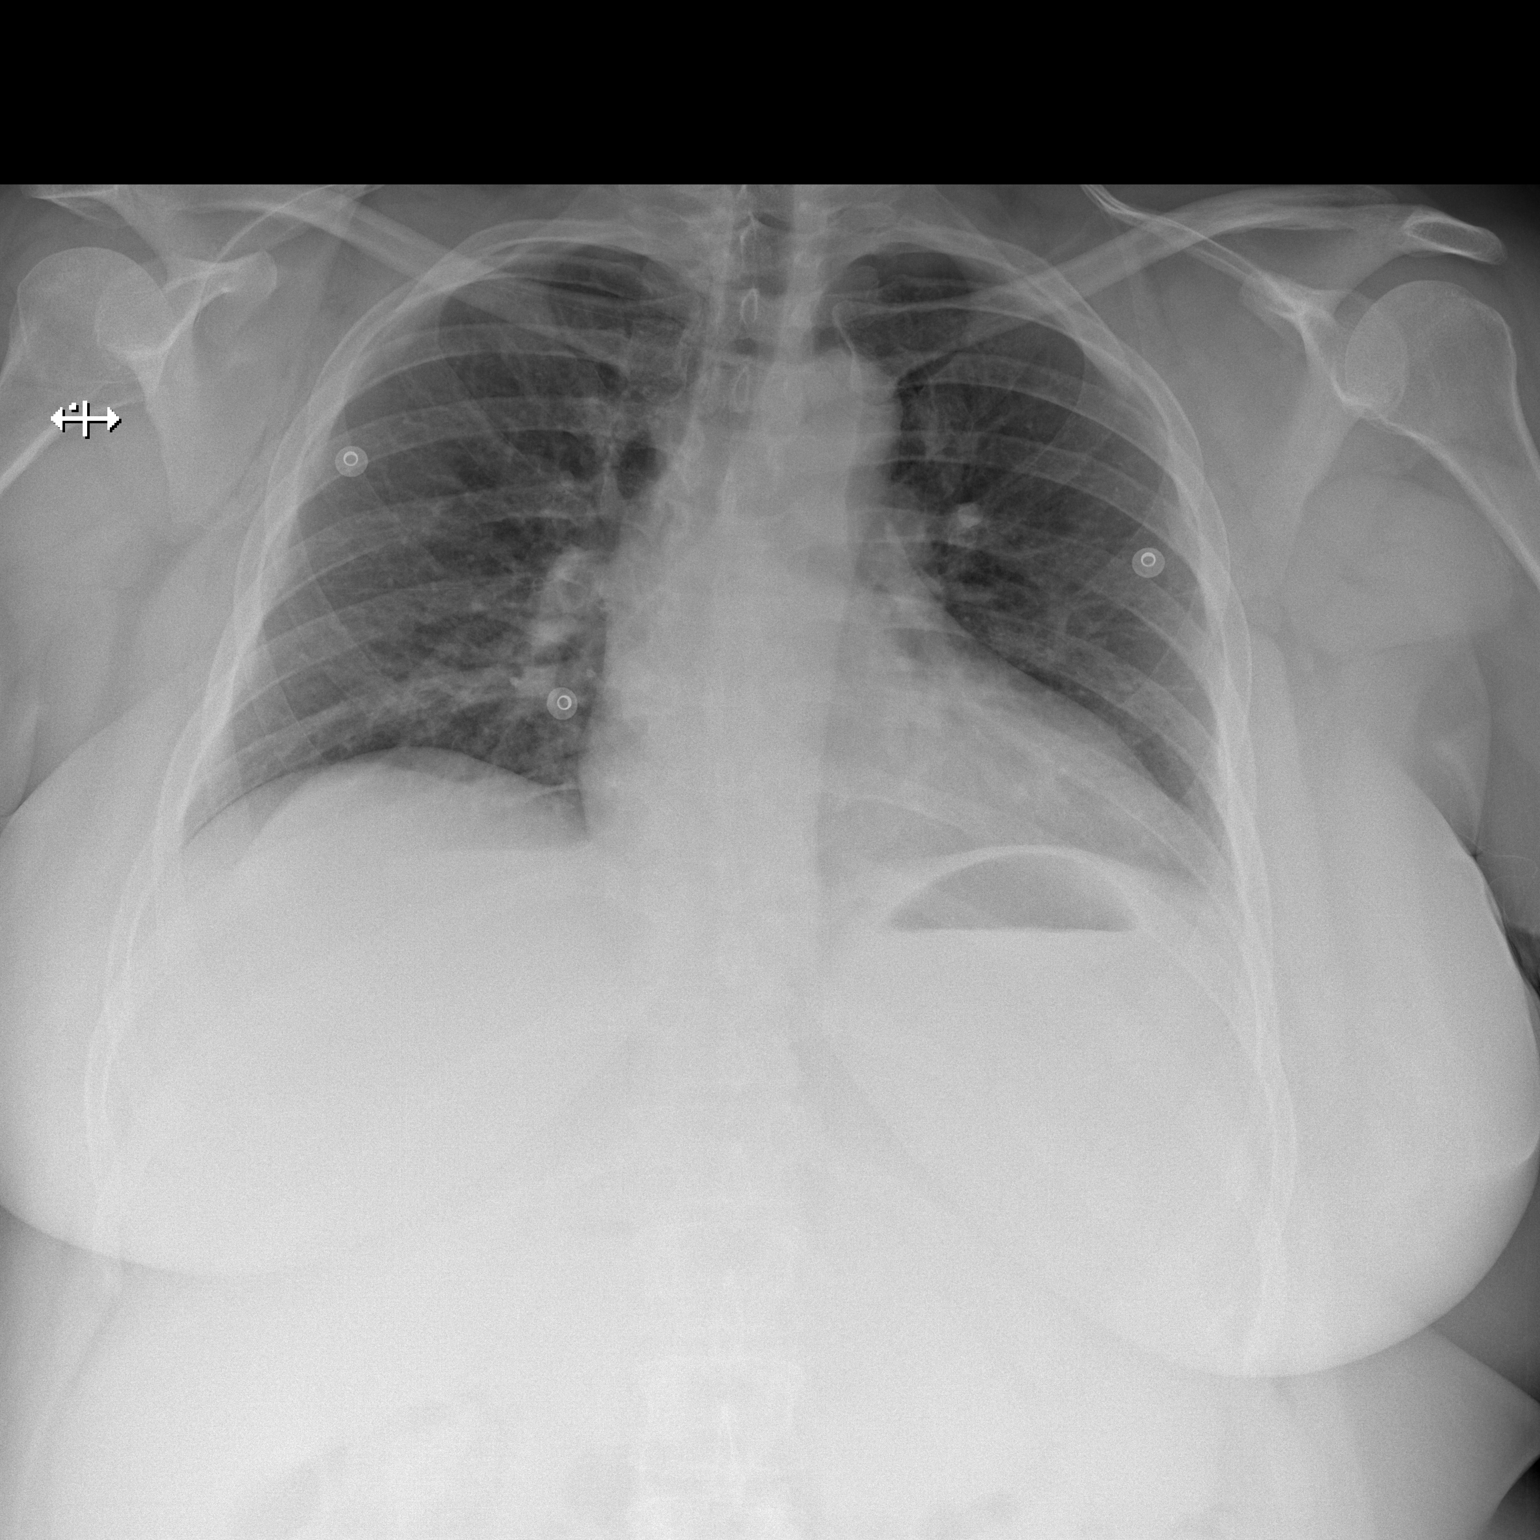
[im 2/2]
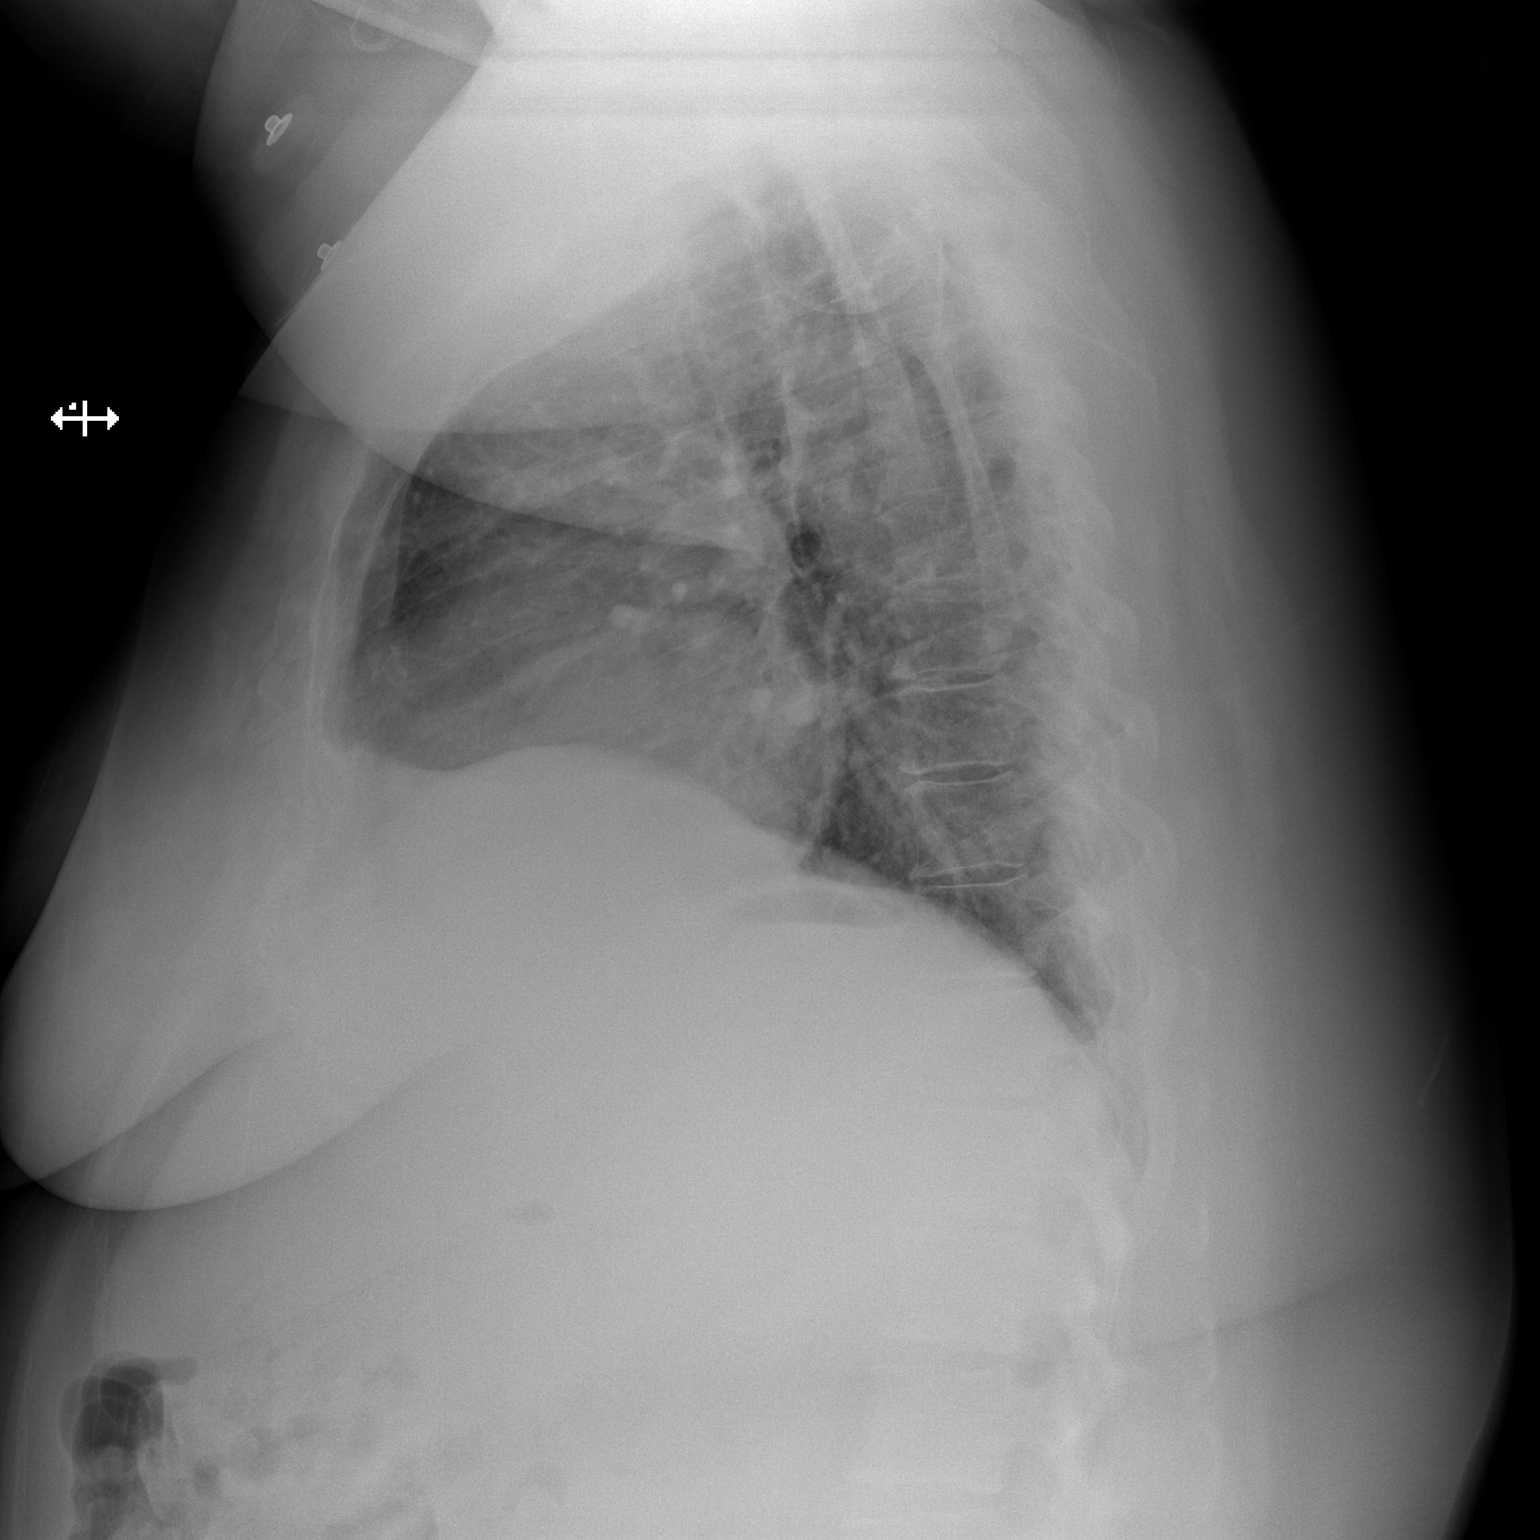

[2 of 2 positions shown; findings below may reference images not displayed]

FINDINGS: The heart size and mediastinal contours are within normal limits.
Both lungs are clear. The visualized skeletal structures are
unremarkable.
IMPRESSION: No active cardiopulmonary disease.

## 2014-09-10 IMAGING — US US EXTREM UP VENOUS*R*
1 series · 13 of 24 positions shown · non-contrast
Comparison: None.

CLINICAL DATA: Right upper extremity pain and swelling, history of
diabetes, evaluate for DVT



[Series 1: us extrem up venous*right* · 0.07mm/px · 13 of 31 slices shown]
[im 1/31]
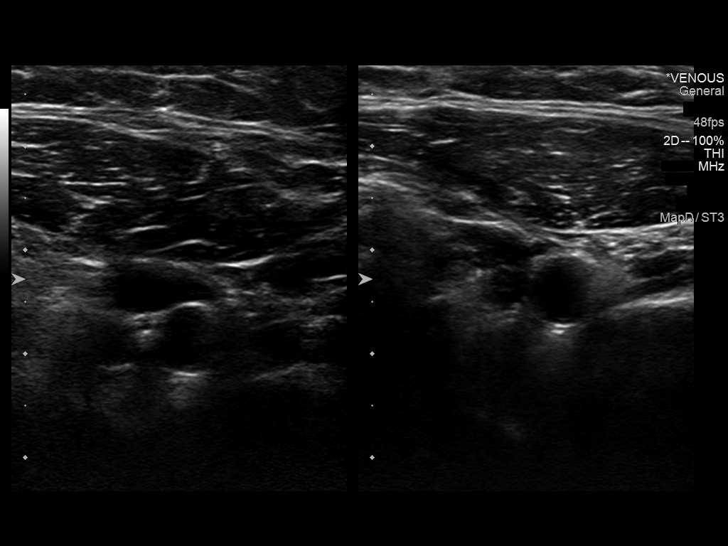
[im 3/31]
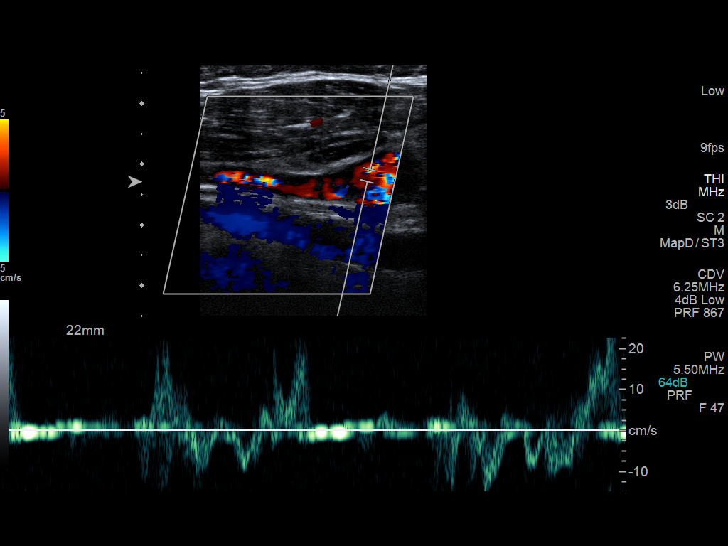
[im 6/31]
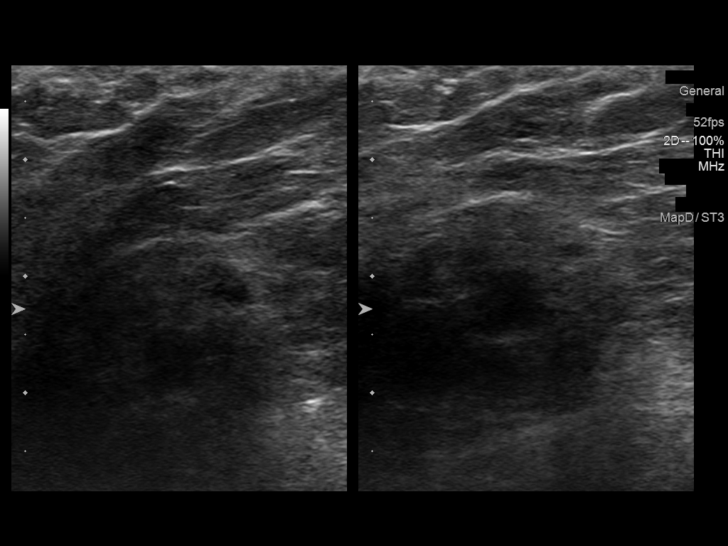
[im 8/31]
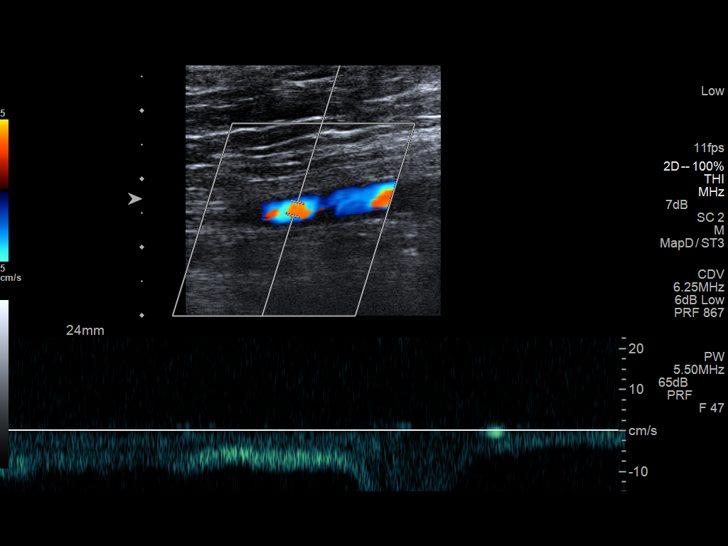
[im 11/31]
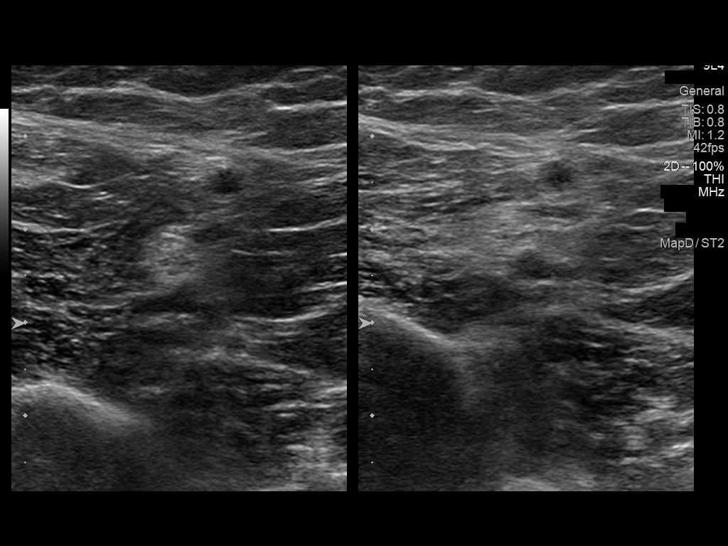
[im 14/31]
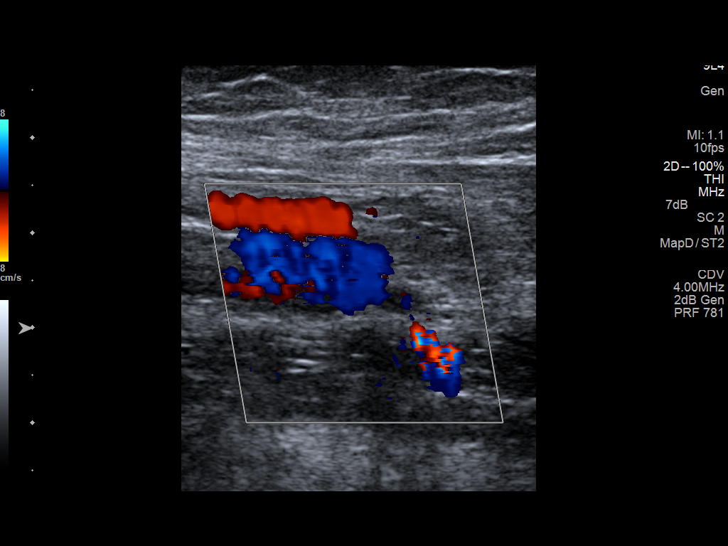
[im 16/31]
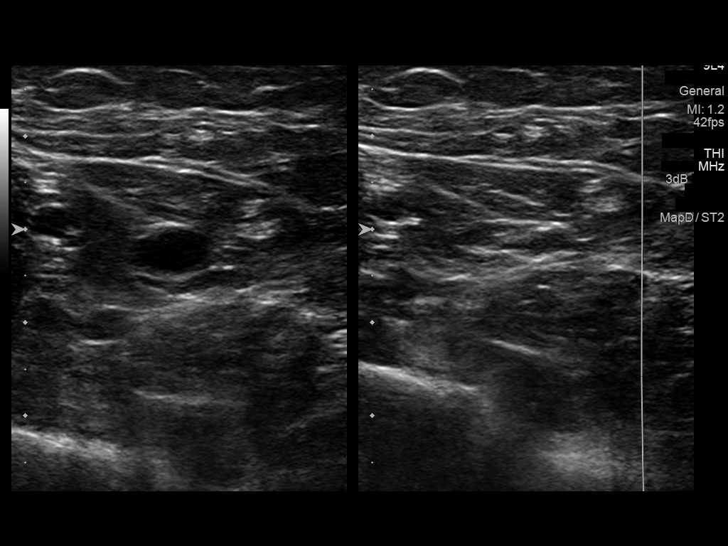
[im 17/31]
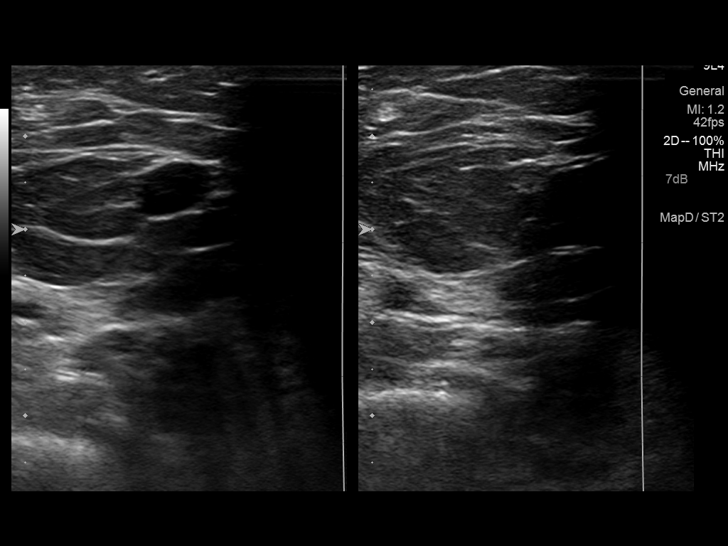
[im 20/31]
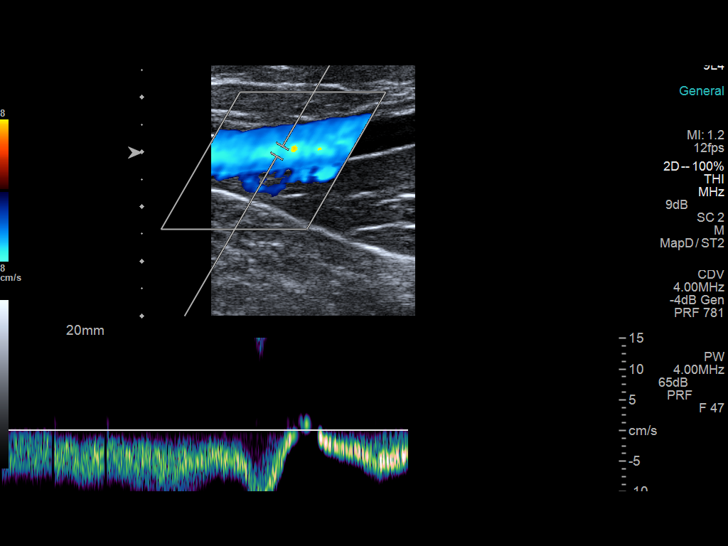
[im 23/31]
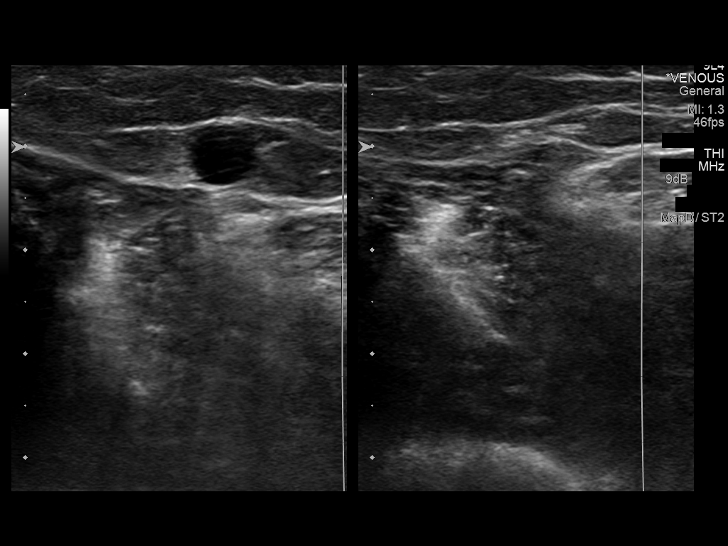
[im 25/31]
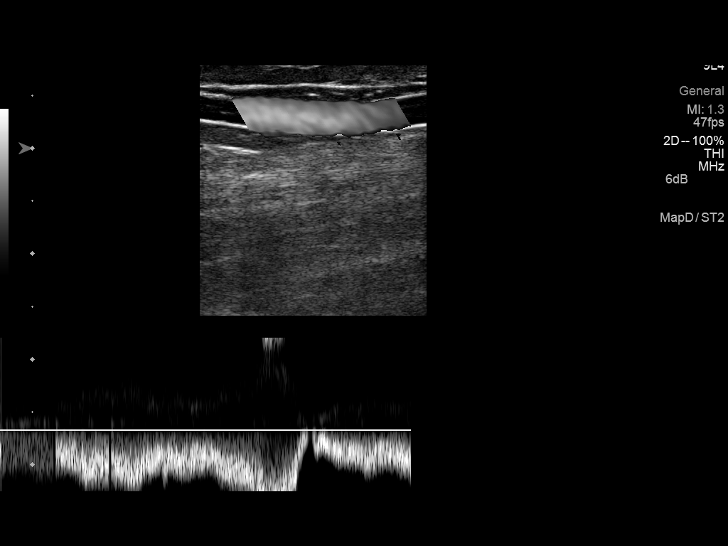
[im 28/31]
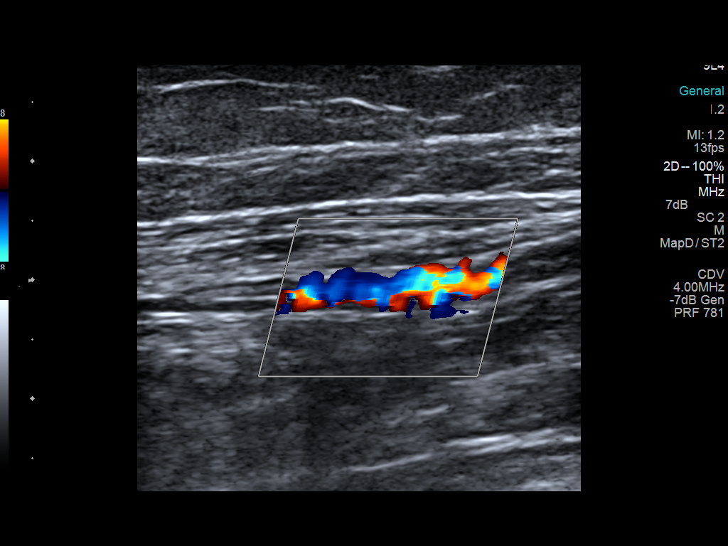
[im 31/31]
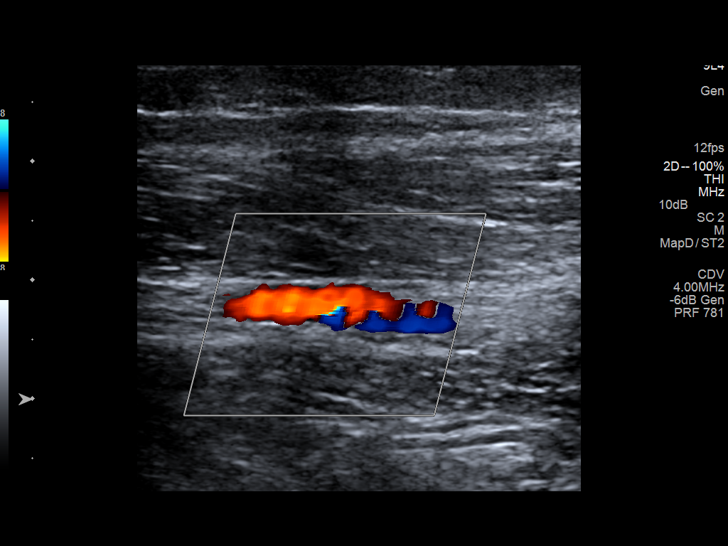

[13 of 24 positions shown; findings below may reference images not displayed]

FINDINGS: Internal Jugular Vein: No evidence of thrombus. Normal
compressibility, respiratory phasicity and response to augmentation.

Subclavian Vein: No evidence of thrombus. Normal compressibility,
respiratory phasicity and response to augmentation.

Axillary Vein: No evidence of thrombus. Normal compressibility,
respiratory phasicity and response to augmentation.

Cephalic Vein: No evidence of thrombus. Normal compressibility,
respiratory phasicity and response to augmentation.

Basilic Vein: No evidence of thrombus. Normal compressibility,
respiratory phasicity and response to augmentation.

Brachial Veins: No evidence of thrombus. Normal compressibility,
respiratory phasicity and response to augmentation.

Radial Veins: No evidence of thrombus. Normal compressibility,
respiratory phasicity and response to augmentation.

Ulnar Veins: No evidence of thrombus. Normal compressibility,
respiratory phasicity and response to augmentation.

Venous Reflux:  None visualized.

Other Findings:  None visualized.
IMPRESSION: No evidence of DVT within the right upper extremity.

## 2014-09-11 NOTE — Discharge Summary (Signed)
Lori Mckenzie, is a 49 y.o. female  DOB Jan 13, 1965  MRN [id number].  Admission date:  09/04/2014  Admitting Physician  Lance Coon, MD  Discharge Date:  09/08/2014   Primary MD  Ashkin, Neldon Labella, MD  Recommendations for primary care physician for things to follow:   Follow-up with Dr. Rexene Edison in 10 days.   Admission Diagnosis  Ascites [R18.8] Abdominal pain, unspecified abdominal location [R10.9]   Discharge Diagnosis  Ascites [R18.8] Abdominal pain, unspecified abdominal location [R10.9]    Principal Problem:   Abdominal pain Active Problems:   Type 2 diabetes mellitus   COPD (chronic obstructive pulmonary disease)   HTN (hypertension)   GERD (gastroesophageal reflux disease)   OSA on CPAP   Ascites   NASH (nonalcoholic steatohepatitis)   Hypokalemia      Past Medical History  Diagnosis Date  . Brittle bone disease   . Asthma   . Thyroid disease   . Hypertension   . Back pain   . Diabetes mellitus without complication   . Chronic kidney disease   . Anxiety   . GERD (gastroesophageal reflux disease)   . Sleep apnea   . COPD (chronic obstructive pulmonary disease)   . Hypothyroidism   . Cervical disc disease   . TIA (transient ischemic attack)   . Cancer     Uteriine  ca 39yr ago partial hysterectomy  . Collagen vascular disease     RA  3-4 yrs ago  . Ascites   . NASH (nonalcoholic steatohepatitis)     Past Surgical History  Procedure Laterality Date  . Abdominal hysterectomy    . Tubal ligation    . Colonoscopy with propofol N/A 06/23/2014    Procedure: COLONOSCOPY WITH PROPOFOL;  Surgeon: MLollie Sails MD;  [facility];  Service: Endoscopy;  Laterality: N/A;  . Esophagogastroduodenoscopy N/A 06/23/2014    Procedure: ESOPHAGOGASTRODUODENOSCOPY (EGD);  Surgeon: MLollie Sails  MD;  [facility];  Service: Endoscopy;  Laterality: N/A;  . Cholecystectomy N/A 07/15/2014    Procedure: LAPAROSCOPIC CHOLECYSTECTOMY with liver biopsy ;  Surgeon: MSherri Rad MD;  Location: [facility] ORS;  Service: General;  Laterality: N/A;  . Ercp N/A 07/16/2014    Procedure: ENDOSCOPIC RETROGRADE CHOLANGIOPANCREATOGRAPHY (ERCP);  Surgeon: MClarene Essex MD;  [facility];  Service: Endoscopy;  Laterality: N/A;       History of present illness and  Hospital Course:     Kindly see H&P for history of present illness and admission details, please review complete Labs, Consult reports and Test reports for all details in brief  HPI  from the history and physical done on the day of admission   49year old female patient admitted secondary to severe abdominal pain, temperature of 101 Fahrenheit at home. Patient has a significant abdominal swelling and the ascites.  Hospital Course  #1worsenign ascites with the fever concern for r SBP. Patient admitted to medical service, intervention radiologist the consulted for her paracentesis. Patient started on empiric Cipro. Patient had about 5 and half liters of fluid drained by ultrasound guided paracentesis.  patient felt better. After . She continued on the Lasix, Aldactone. She has history of for liver cirrhosis secondary to the NASH.   For possible SBP , given 14 days of Cipro. 2. Chronic pain. Patient continued to require IV Dilaudid even though her ascites is improved. . Patient has seen by pain management specialist before. [place] Wrote limited supply of  20 tablets of oxycodone.,has possible narcotic seeking behavior,  But the anxiety next number the number for depression next line GERD COPD History of recent cholecystectomy, complicated by biliary leak. Surgery is following on that. The did not have any postsurgical issues in this hospitalization. Patient needs ERCP as  an outpatient to remove the CBD stent. Discharge Condition: stable   Follow UP  Follow-up Information    Follow up with Ashkin, Neldon Labella, MD In 1 week.   Specialty:  Family Medicine   Contact information:   [place] [address] [phone]         Discharge Instructions  and  Discharge Medications       Medication List    STOP taking these medications        amoxicillin-clavulanate 875-125 MG per tablet  Commonly known as:  AUGMENTIN     aspirin 81 MG EC tablet     hydrochlorothiazide 25 MG tablet  Commonly known as:  HYDRODIURIL     oxyCODONE-acetaminophen 5-325 MG per tablet  Commonly known as:  PERCOCET/ROXICET     OXYGEN      TAKE these medications        albuterol 108 (90 BASE) MCG/ACT inhaler  Commonly known as:  PROVENTIL HFA;VENTOLIN HFA  Inhale 2 puffs into the lungs every 4 (four) hours as needed for wheezing or shortness of breath.     ALDACTONE 100 MG tablet  Generic drug:  spironolactone  Take 1 tablet (100 mg total) by mouth daily.     ALPRAZolam 1 MG tablet  Commonly known as:  XANAX  Take 1 tablet (1 mg total) by mouth 3 (three) times daily as needed for anxiety.     ciprofloxacin 500 MG tablet  Commonly known as:  CIPRO  Take 1 tablet (500 mg total) by mouth 2 (two) times daily.     citalopram 20 MG tablet  Commonly known as:  CELEXA  Take 20 mg by mouth at bedtime.     clotrimazole 10 MG troche  Commonly known as:  MYCELEX  Take 10 mg by mouth 4 (four) times daily as needed (for dry mouth).     fluticasone 50 MCG/ACT nasal spray  Commonly known as:  FLONASE  Place 1 spray into both nostrils daily as needed for rhinitis.     furosemide 40 MG tablet  Commonly known as:  LASIX  Take 80 mg by mouth 2 (two) times daily.     gabapentin 300 MG capsule  Commonly known as:  NEURONTIN  Take 300 mg by mouth 4 (four) times daily as needed (for nerve pain).     ipratropium 17 MCG/ACT inhaler  Commonly known as:   ATROVENT HFA  Inhale 2 puffs into the lungs every 8 (eight) hours.     metFORMIN 1000 MG tablet  Commonly known as:  GLUCOPHAGE  Take 500-1,000 mg by mouth 2 (two) times daily as needed (for high blood sugar).     omeprazole 20 MG capsule  Commonly known as:  PRILOSEC  Take 40 mg by mouth 2 (two) times daily as needed (for acid reflux).     Oxycodone HCl 10 MG Tabs  Commonly known as:  ROXICODONE  Take 0.5 tablets (5 mg total) by mouth every 4 (four) hours as needed for severe pain.     rizatriptan 10 MG tablet  Commonly known as:  MAXALT  Take 10 mg by mouth as needed for migraine.  Diet and Activity recommendation: See Discharge Instructions above   Consults obtained -Dr.oh Gen Surg  Major procedures and Radiology Reports - PLEASE review detailed and final reports for all details, in brief -      Dg Chest 2 View  08/25/2014   CLINICAL DATA:  Shortness of breath and wheezing.  EXAM: CHEST  2 VIEW  COMPARISON:  08/06/2014  FINDINGS: There is a small opacity along the posterior lung bases on the lateral view. This probably represents overlying shadows based on the recent CT from 08/06/2014. No significant airspace disease or pulmonary edema. No acute bone abnormality. No large pleural effusions. Heart and mediastinum are within normal limits.  IMPRESSION: No acute chest abnormality.   Electronically Signed   By: Markus Daft M.D.   On: 08/25/2014 12:14   Dg Shoulder Right  08/26/2014   CLINICAL DATA:  Right shoulder pain.  EXAM: RIGHT SHOULDER - 2+ VIEW  COMPARISON:  None.  FINDINGS: There is no evidence of fracture or dislocation. There is no evidence of arthropathy or other focal bone abnormality. Soft tissues are unremarkable.  IMPRESSION: Negative.   Electronically Signed   By: Kerby Moors M.D.   On: 08/26/2014 17:40   Nm Hepatobiliary Liver Func  08/19/2014   CLINICAL DATA:  Status post cholecystectomy in July 2016, with adhesions to gallbladder, complicated by  biliary leak with surgical drain placement. Drainage tube clamped. Evaluate for persistent bile leak.  EXAM: NUCLEAR MEDICINE HEPATOBILIARY IMAGING  TECHNIQUE: Sequential images of the abdomen were obtained out to 60 minutes following intravenous administration of radiopharmaceutical.  RADIOPHARMACEUTICALS:  4.95 mCi Tc-72m Choletec IV  COMPARISON:  CT abdomen pelvis dated 08/12/2014  FINDINGS: Normal hepatic excretion.  Small bowel is visualized within 25 minutes.  Imaging was continued for 120 minutes.  No evidence of persistent bile leak. No pooling of contrast within the gallbladder fossa.  IMPRESSION: No evidence of persistent bile leak.   Electronically Signed   By: SJulian HyM.D.   On: 08/19/2014 16:11   Ct Abdomen Pelvis W Contrast  09/04/2014   CLINICAL DATA:  Increasing abdominal girth and weight gain, with fever. Diffuse abdominal pain. Recent cholecystectomy and bile leak. Initial encounter.  EXAM: CT ABDOMEN AND PELVIS WITH CONTRAST  TECHNIQUE: Multidetector CT imaging of the abdomen and pelvis was performed using the standard protocol following bolus administration of intravenous contrast.  CONTRAST:  1044mOMNIPAQUE IOHEXOL 300 MG/ML  SOLN  COMPARISON:  CT of the abdomen and pelvis performed 08/29/2014  FINDINGS: A small left pleural effusion is noted, with associated atelectasis.  Moderate to large volume ascites within the abdomen and pelvis appears mildly worsened from the prior study.  Fluid at the gallbladder fossa is slightly less apparent than on the prior study. There is no evidence of recurrent bile leak. Scattered clips are seen about the gallbladder fossa. The spleen is enlarged, measuring 18.4 cm in length. Hypodensities within the left hepatic lobe measure up to 1.9 cm in size, nonspecific in nature. The mildly nodular contour of the liver raises concern for hepatic cirrhosis.  The pancreas and adrenal glands are grossly unremarkable. A pancreatic duct stent is noted ending at  the second segment of the duodenum.  The kidneys are unremarkable in appearance. There is no evidence of hydronephrosis. No renal or ureteral stones are seen. No perinephric stranding is appreciated.  No free fluid is identified. The small bowel is unremarkable in appearance. The stomach is within normal limits. No acute vascular abnormalities  are seen.  Vague nonspecific edema is noted within the omentum. This may be related to the patient's cirrhosis.  The appendix is normal in caliber, without evidence of appendicitis. The colon is largely decompressed and is unremarkable in appearance.  The bladder is mildly distended and grossly unremarkable. The patient is status post hysterectomy. No suspicious adnexal masses are seen. No inguinal lymphadenopathy is seen.  No acute osseous abnormalities are identified.  IMPRESSION: 1. Moderate to large volume ascites within the abdomen and pelvis is mildly increased from the prior study. 2. No evidence of recurrent bile leak. 3. Findings suggestive of hepatic cirrhosis. Underlying splenomegaly noted. Nonspecific hypodensities within the left hepatic lobe measure up to 1.9 cm in size. 4. Vague nonspecific soft tissue edema within the omentum. This may be related to the patient's cirrhosis. 5. Small left pleural effusion noted, with associated atelectasis.   Electronically Signed   By: Garald Balding M.D.   On: 09/04/2014 23:52   Ct Abdomen Pelvis W Contrast  08/29/2014   CLINICAL DATA:  49 year old female with increased abdominal pain and distension status post cholecystectomy in July. Stent placement for bile leak subsequent. Gallbladder fossa abscess drained earlier this month. Subsequent encounter.  EXAM: CT ABDOMEN AND PELVIS WITH CONTRAST  TECHNIQUE: Multidetector CT imaging of the abdomen and pelvis was performed using the standard protocol following bolus administration of intravenous contrast.  CONTRAST:  111m OMNIPAQUE IOHEXOL 300 MG/ML  SOLN  COMPARISON:   08/25/2014 and earlier  FINDINGS: Stable mild cardiomegaly. Layering small left pleural effusion has not significantly changed. Mild dependent lung base atelectasis is stable.  Degenerative osseous changes in the spine. No acute osseous abnormality identified.  Negative rectum mildly distended with gas. Mildly distended but otherwise negative urinary bladder.  Moderate pelvic and abdominal ascites is stable from 4 days ago. As noted at that time, this is partially loculated, for example with mild enhancing septations noted about the spleen.  Oral contrast mixed with stool in the sigmoid colon which is redundant. Retained stool throughout the more proximal colon. Most small bowel is decompressed in the abdomen. The stomach is decompressed. There is a biliary stent terminating in the duodenum. The fourth portion of the duodenum and ligament of Treitz along with a few proximal small bowel loops are dilated 3.5-4 cm diameter. There is been a somewhat gradual transition to nondilated loops. No pneumoperitoneum.  Stable liver. Residual mixed density collection in the gallbladder fossa has regressed, now 25 mm diameter (previously 30-31 mm). The proximal CBD and central bile ducts are prominent but stable. Stable splenomegaly. The portal venous system is patent.  Negative pancreas, no main pancreatic duct dilatation. Negative adrenal glands. Renal contrast enhancement and excretion is symmetric and within normal limits. Major arterial structures are patent. No lymphadenopathy.  Mildly increased subcutaneous fat stranding along the panniculus.  IMPRESSION: 1. Smaller mixed density fluid collection in the gallbladder fossa since 4 days ago following percutaneous drain removal. No evidence of abscess recurrence at this time. 2. Interval mildly dilated ligament of Treitz and proximal small bowel loops, but no transition point or obstructing lesion. Favor ileus. 3. Otherwise stable abdomen and pelvis since 08/25/2014, including  moderate volume loculated ascites which might be the sequelae of bile peritonitis. 4. Stable small left pleural effusion.   Electronically Signed   By: HGenevie AnnM.D.   On: 08/29/2014 09:34   Ct Abdomen Pelvis W Contrast  08/25/2014   CLINICAL DATA:  Status post laparoscopic cholecystectomy on 07/15/2014, status post  ERCP with stent placement for bile leak on 07/16/2014, status post percutaneous drainage for gallbladder fossa abscess on 08/04/2014, with drain removal on 08/21/2014. Now with increased abdominal distention, pain, and fever. No further drainage from abdominal wall puncture site.  EXAM: CT ABDOMEN AND PELVIS WITH CONTRAST  TECHNIQUE: Multidetector CT imaging of the abdomen and pelvis was performed using the standard protocol following bolus administration of intravenous contrast.  CONTRAST:  165m OMNIPAQUE IOHEXOL 300 MG/ML  SOLN  COMPARISON:  08/12/2014.  FINDINGS: Lower chest: Small left and trace right pleural effusions. Associated left lower lobe atelectasis.  Hepatobiliary: Liver is notable for scattered hepatic cysts measuring up to 1.8 cm in the medial segment left hepatic lobe (series 2/ image 22).  Status post cholecystectomy.  3.0 x 3.1 cm fluid collection/seroma in the surgical bed, minimally complicated by hemorrhage along its lateral margin (series 2/ image 31) interval removal of pigtail drainage catheter.  No intrahepatic or extrahepatic ductal dilatation. Biliary drain in the distal common duct, terminating in the duodenum.  Pancreas: Within normal limits.  Spleen: Within normal limits.  Adrenals/Urinary Tract: Adrenal glands are within normal limits.  Kidneys within normal limits.  No hydronephrosis.  Bladder is within normal limits.  Stomach/Bowel: Stomach is within normal limits.  No evidence of bowel obstruction.  Mild colonic diverticulosis, without evidence of diverticulitis.  Vascular/Lymphatic: No evidence of abdominal aortic aneurysm.  No suspicious abdominopelvic  lymphadenopathy.  Reproductive: Status post hysterectomy.  Bilateral ovaries are poorly visualized but grossly unremarkable.  Loculated pelvic ascites in the dependent pelvis measuring 2.7 x 3.8 cm with thin enhancing rim (series 2/ image 39).  Other: Additional moderate abdominopelvic ascites.  Musculoskeletal: Degenerative changes of the visualized thoracolumbar spine.  IMPRESSION: Status post cholecystectomy.  Interval removal of pigtail drainage catheter with residual 3.0 x 3.1 cm fluid collection/seroma in the surgical bed.  No intrahepatic/ extrahepatic ductal dilatation. Distal CBD drain terminates in the duodenum.  Moderate abdominopelvic ascites. Loculated dependent pelvic ascites with thin enhancing rim measuring 2.7 x 3.8 cm.   Electronically Signed   By: SJulian HyM.D.   On: 08/25/2014 17:05   [place]oreaAbdomen Limited  09/03/2014   CLINICAL DATA:  Abdominal pain for 2 weeks, history of cirrhosis, cholecystectomy  EXAM: LIMITED ABDOMEN ULTRASOUND FOR ASCITES  TECHNIQUE: Limited ultrasound survey for ascites was performed in all four abdominal quadrants.  COMPARISON:  08/29/2014  FINDINGS: There is a moderate volume of ascites in the abdomen and pelvis.  IMPRESSION: Abdominal ascites identified. Volume is similar when compared to 08/29/2014 CT scan.   Electronically Signed   By: RSkipper ClicheM.D.   On: 09/03/2014 09:34   [place]oreaParacentesis  09/05/2014   CLINICAL DATA:  persistent abdominal pain. Patient had cholecystectomy done laparoscopically couple of months ago now, which was complicated by a bile leak at the time. On follow-up evaluation, the bile drain completely from her abdomen, and drains removed, and the patient has been healing routinely since. 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 Since her surgery, her ascites has continued to build more dramatically. She came to the ED today for the same plus a report of  fevers at home. On evaluation in the ED today her workup was largely benign, with a normal white count and labs otherwise largely normal except for hypokalemia with potassium of 2.6. CT abdomen and pelvis does not show any signs of recurrent or persistent bile leakage  or bile in her abdominal cavity. It does show significant ascites slightly greater than prior imaging. Surgery was consult is from the ED and does not feel this issue is related to her prior surgery or that she has a surgical problem at this time, hospitalists were then called for admission was some concern for possible SBP given the report of fevers and the presence of ascites.  EXAM: ULTRASOUND GUIDED PARACENTESIS  TECHNIQUE: The procedure, risks (including but not limited to bleeding, infection, organ damage ), benefits, and alternatives were explained to the patient. Questions regarding the procedure were encouraged and answered. The patient understands and consents to the procedure. Survey ultrasound of the abdomen was performed and an appropriate skin entry site in the right lateral abdomen was selected. Skin site was marked, prepped with chlorhexidine, and draped in usual sterile fashion, and infiltrated locally with 1% lidocaine. A Safe-T-Centesis sheath needle was advanced into the peritoneal space until fluid could be aspirated. The sheath was advanced and the needle removed. 5.78 L of clear yellowascites were aspirated. A sample sent for the requested laboratory studies. The patient tolerated the procedure well.  COMPLICATIONS: COMPLICATIONS none  IMPRESSION: Technically successful ultrasound guided paracentesis, removing 5.78 L of ascites.   Electronically Signed   By: Lucrezia Europe M.D.   On: 09/05/2014 12:53   Dg Chest Port 1 View  08/27/2014   CLINICAL DATA:  BILATERAL wheezing, history COPD, diabetes mellitus with TIA, hypertension  EXAM: PORTABLE CHEST - 1 VIEW  COMPARISON:  Portable exam 1207 hours compared 08/24/2014  FINDINGS:  Enlargement of cardiac silhouette with vascular congestion.  Mediastinal contours normal.  Central peribronchial thickening.  Lungs clear.  No pleural effusion or pneumothorax.  No acute osseous findings.  IMPRESSION: Enlargement of cardiac silhouette with pulmonary vascular congestion.  Bronchitic changes without infiltrate.   Electronically Signed   By: Lavonia Jaleeya M.D.   On: 08/27/2014 12:22    Micro Results    Recent Results (from the past 240 hour(s))  Culture, blood (routine x 2)     Status: None   Collection Time: 09/04/14  9:44 PM  Result Value Ref Range Status   Specimen Description BLOOD BLOOD LEFT FOREARM  Final   Special Requests BOTTLES DRAWN AEROBIC AND ANAEROBIC 10ML  Final   Culture NO GROWTH 5 DAYS  Final   Report Status 09/09/2014 FINAL  Final  Culture, blood (routine x 2)     Status: None   Collection Time: 09/04/14  9:49 PM  Result Value Ref Range Status   Specimen Description BLOOD BLOOD RIGHT FOREARM  Final   Special Requests   Final    BOTTLES DRAWN AEROBIC AND ANAEROBIC 10MLAEROBIC, 5MLANAEROBIC   Culture NO GROWTH 5 DAYS  Final   Report Status 09/09/2014 FINAL  Final  Urine culture     Status: None   Collection Time: 09/04/14 10:06 PM  Result Value Ref Range Status   Specimen Description URINE, RANDOM  Final   Special Requests Normal  Final   Culture MULTIPLE SPECIES PRESENT, SUGGEST RECOLLECTION  Final   Report Status 09/06/2014 FINAL  Final  Body fluid culture     Status: None   Collection Time: 09/05/14 11:47 AM  Result Value Ref Range Status   Specimen Description ABDOMEN  Final   Special Requests NONE  Final   Gram Stain RARE WBC SEEN NO ORGANISMS SEEN   Final   Culture NO GROWTH 4 DAYS  Final   Report Status 09/09/2014 FINAL  Final  Today   Subjective:   Lori Mckenzie today has no headache,no chest abdominal pain,no new weakness tingling or numbness, feels much better wants to go home today.   Objective:   Blood pressure 104/57,  pulse 91, temperature 98.2 F (36.8 C), temperature source Oral, resp. rate 17, height 5' 2"  (1.575 m), weight 109.725 kg (241 lb 14.4 oz), SpO2 95 %.  No intake or output data in the 24 hours ending 09/11/14 1314  Exam Awake Alert, Oriented x 3, No new F.N deficits, Normal affect [place].AT,PERRAL Supple Neck,No JVD, No cervical lymphadenopathy appriciated.  Symmetrical Chest wall movement, Good air movement bilaterally, CTAB RRR,No Gallops,Rubs or new Murmurs, No Parasternal Heave +ve B.Sounds, Abd Soft, Non tender, No organomegaly appriciated, No rebound -guarding or rigidity. No Cyanosis, Clubbing or edema, No new Rash or bruise  Data Review   CBC w Diff:  Lab Results  Component Value Date   WBC 5.0 09/05/2014   WBC 9.2 03/27/2014   HGB 9.5* 09/05/2014   HGB 16.5* 03/27/2014   HCT 28.9* 09/05/2014   HCT 49.3* 03/27/2014   PLT 168 09/05/2014   PLT 209 03/27/2014   LYMPHOPCT 24 09/04/2014   LYMPHOPCT 23.7 03/27/2014   MONOPCT 7 09/04/2014   MONOPCT 5.6 03/27/2014   EOSPCT 1 09/04/2014   EOSPCT 0.4 03/27/2014   BASOPCT 1 09/04/2014   BASOPCT 0.8 03/27/2014    CMP:  Lab Results  Component Value Date   NA 137 09/08/2014   NA 142 03/27/2014   K 3.7 09/08/2014   K 3.5 03/27/2014   CL 97* 09/08/2014   CL 101 03/27/2014   CO2 31 09/08/2014   CO2 27 03/27/2014   BUN 5* 09/08/2014   BUN 16 03/27/2014   CREATININE 0.63 09/08/2014   CREATININE 0.73 03/27/2014   PROT 7.6 09/04/2014   PROT 9.4* 03/27/2014   ALBUMIN 2.9* 09/04/2014   ALBUMIN 5.3* 03/27/2014   BILITOT 0.8 09/04/2014   BILITOT 1.5* 03/27/2014   ALKPHOS 54 09/04/2014   ALKPHOS 63 03/27/2014   AST 34 09/04/2014   AST 81* 03/27/2014   ALT 12* 09/04/2014   ALT 58* 03/27/2014  .   Total Time in preparing paper work, data evaluation and todays exam - 19 minutes  Syrina Wake M.D on 09/08/2014 at 1:14 PM

## 2014-09-18 DIAGNOSIS — R579 Shock, unspecified: Secondary | ICD-10-CM | POA: Insufficient documentation

## 2014-09-18 HISTORY — DX: Shock, unspecified: R57.9

## 2014-09-20 IMAGING — CR DG CHEST 2V
1 series · 2 of 2 positions shown · non-contrast
Comparison: 04/26/2013

CLINICAL DATA: Shortness of breath

EXAM:
CHEST  2 VIEW

[Series 3: w chest pa · 0.14mm/px · 2 of 2 slices shown]
[im 1/2]
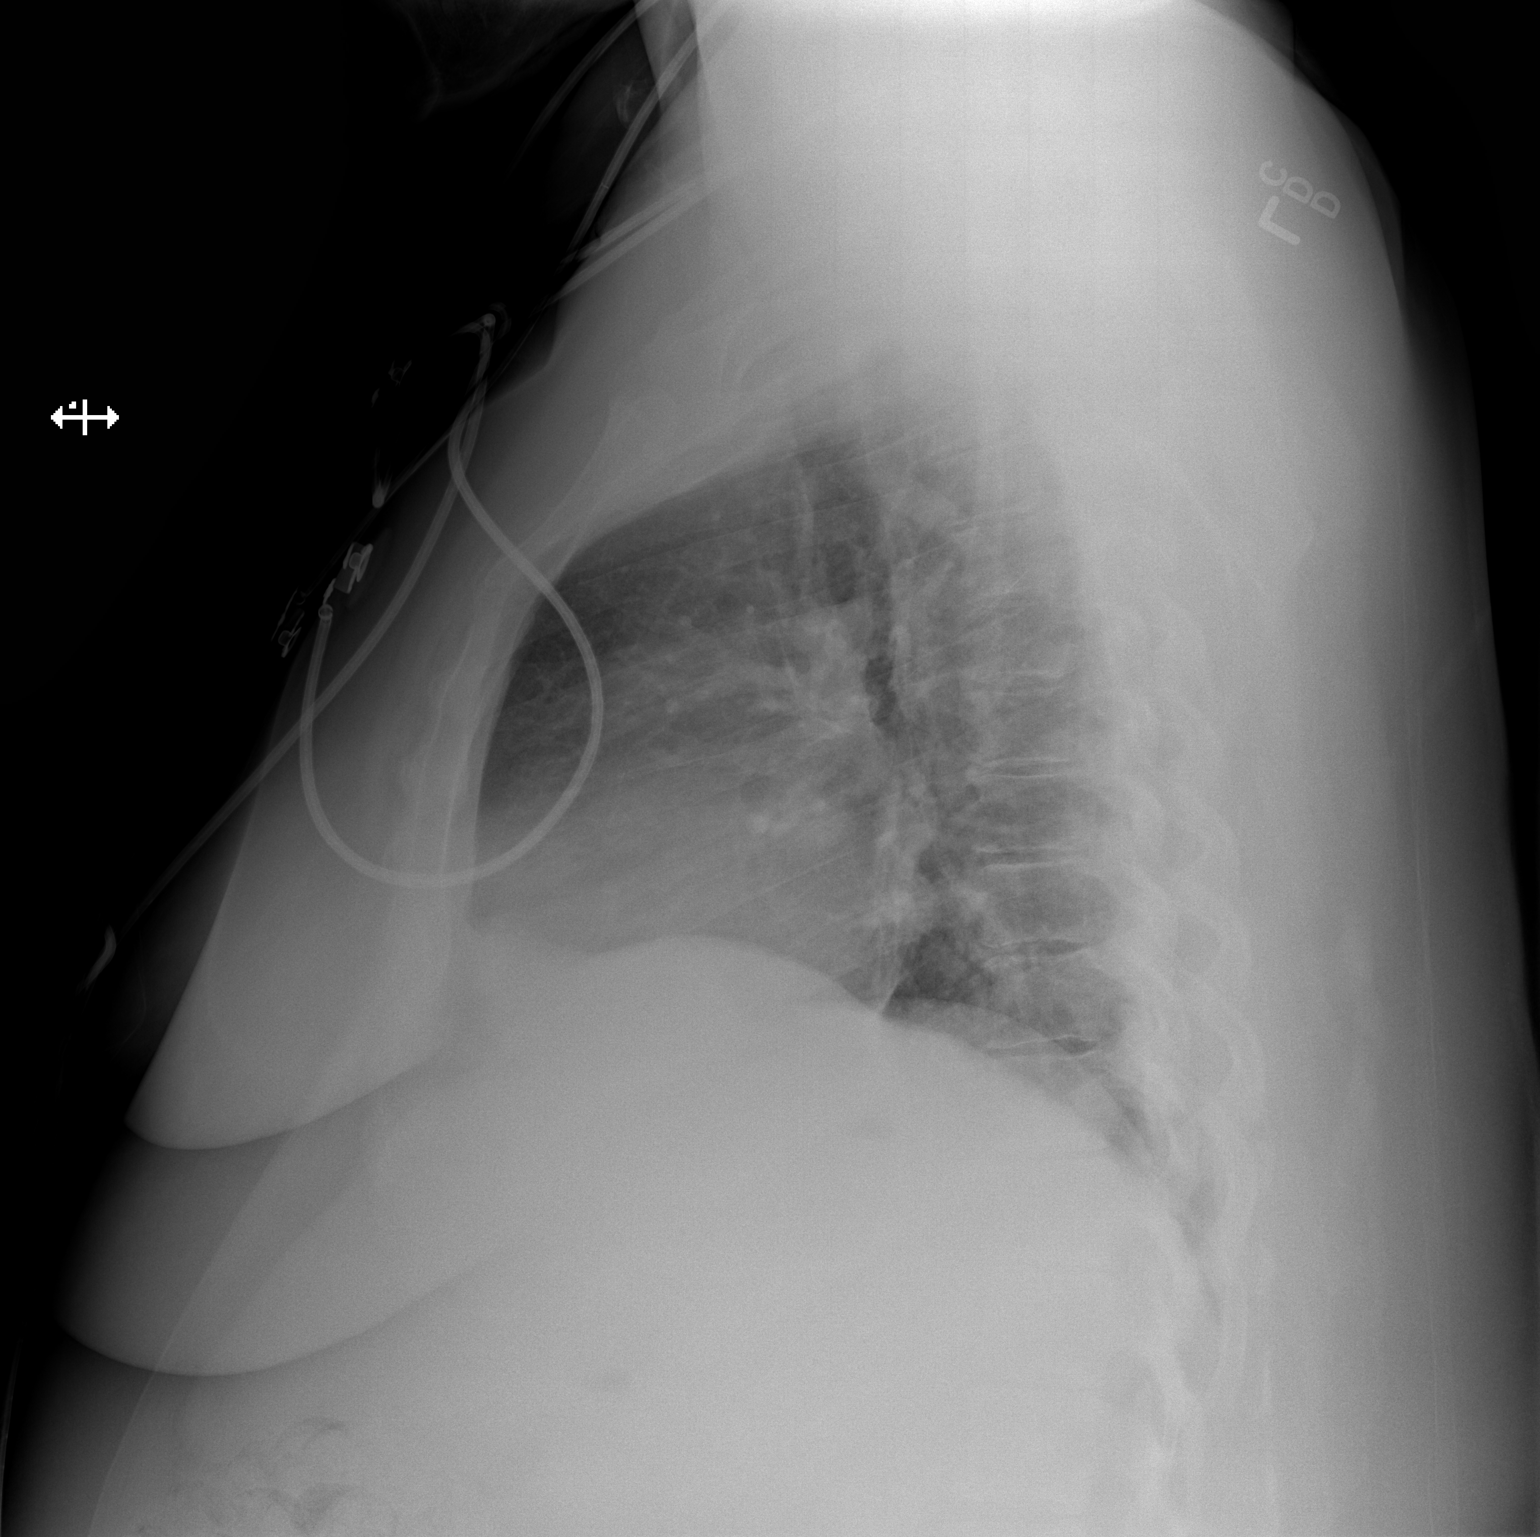
[im 2/2]
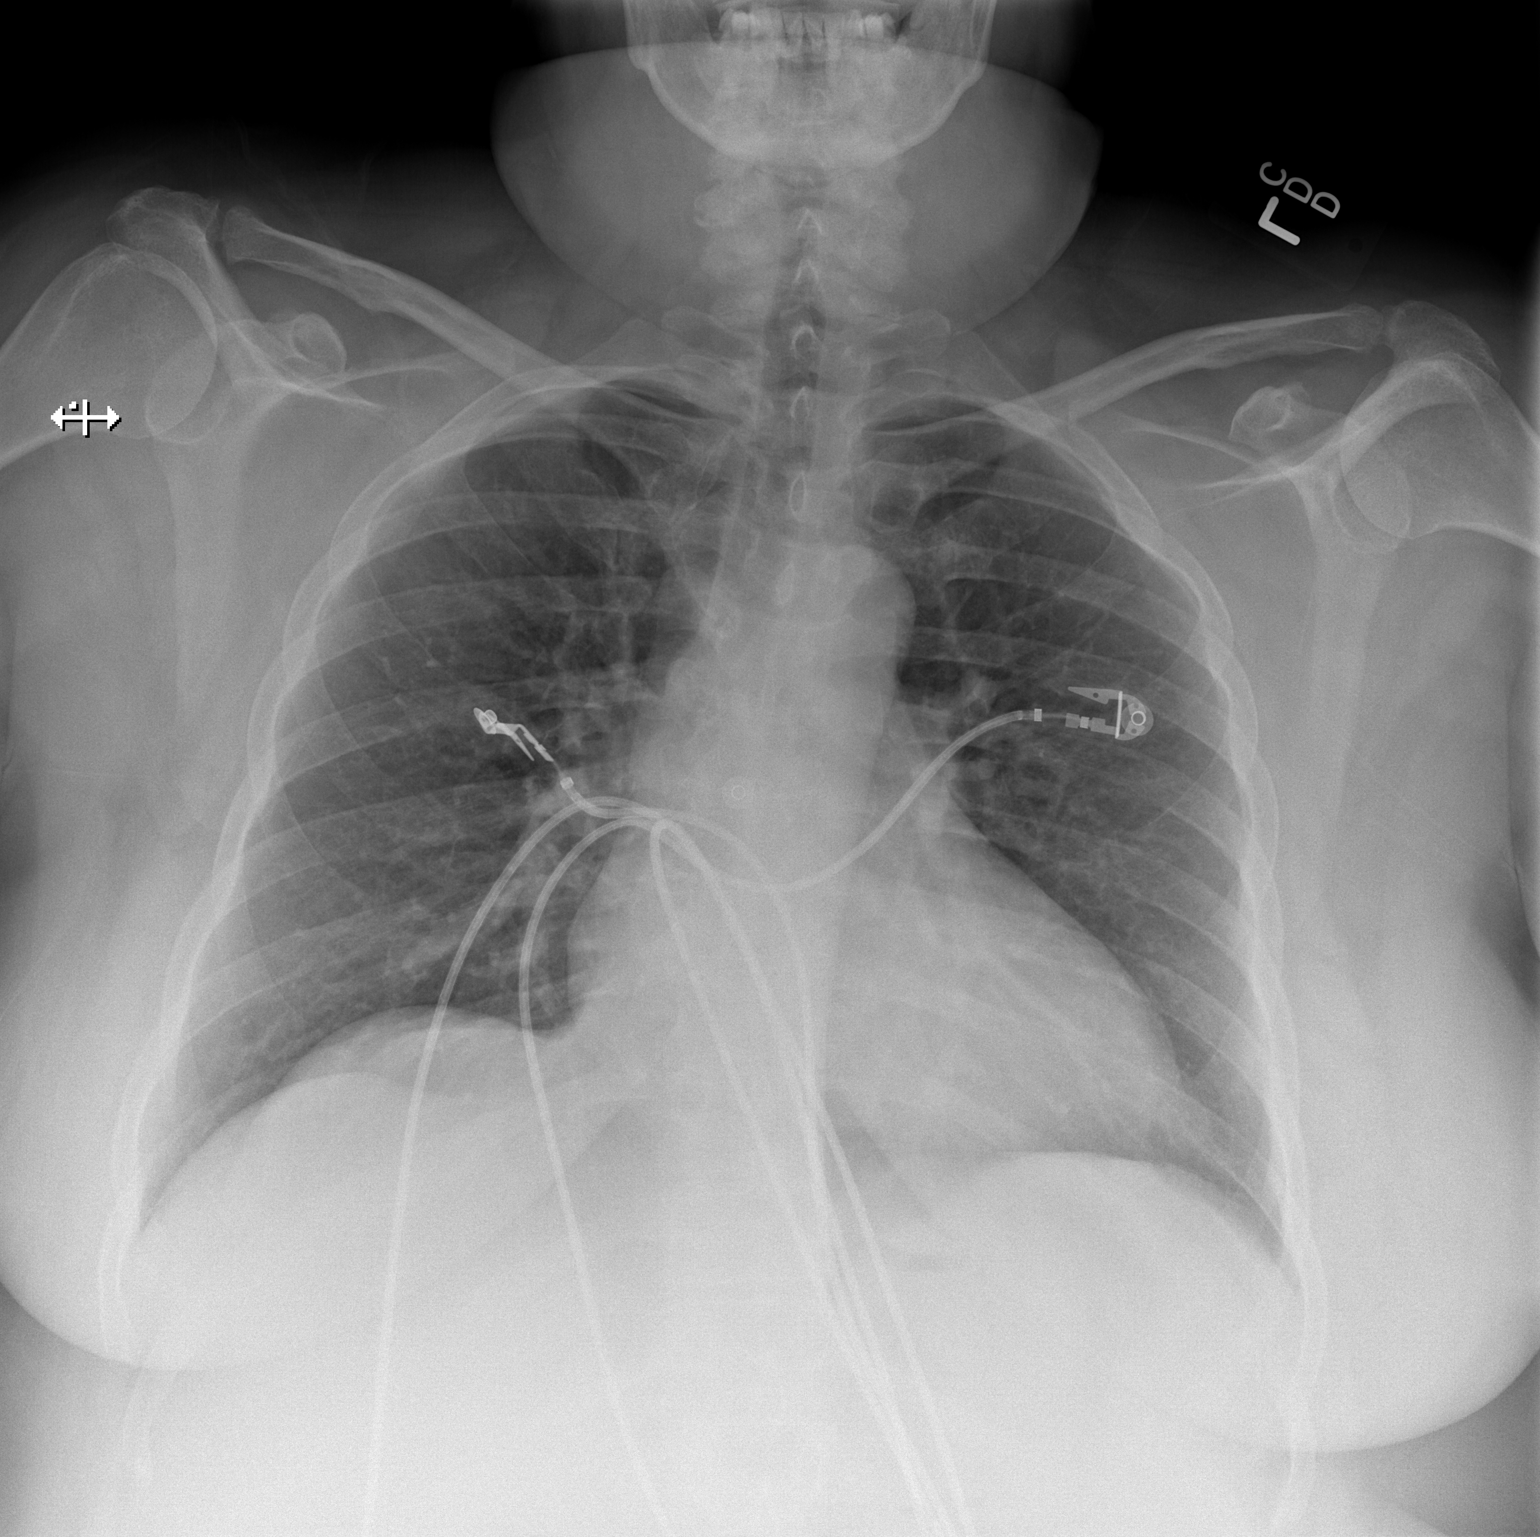

[2 of 2 positions shown; findings below may reference images not displayed]

FINDINGS: Cardiac shadow is stable. The lungs are well aerated bilaterally. No
acute bony abnormality is seen.
IMPRESSION: No active cardiopulmonary disease.

## 2014-10-02 ENCOUNTER — Encounter: Payer: Self-pay | Admitting: *Deleted

## 2014-10-03 ENCOUNTER — Encounter: Payer: Self-pay | Admitting: Emergency Medicine

## 2014-10-03 ENCOUNTER — Ambulatory Visit: Payer: Medicaid Other | Admitting: Anesthesiology

## 2014-10-03 ENCOUNTER — Encounter: Payer: Self-pay | Admitting: Anesthesiology

## 2014-10-03 ENCOUNTER — Emergency Department: Payer: Medicaid Other

## 2014-10-03 ENCOUNTER — Encounter
Admission: RE | Disposition: A | Discharge status: Home / Self Care | Payer: Self-pay | Source: Ambulatory Visit | Attending: Gastroenterology

## 2014-10-03 ENCOUNTER — Ambulatory Visit
Admission: RE | Admit: 2014-10-03 | Discharge: 2014-10-03 | Disposition: A | Discharge status: Home / Self Care | Payer: Medicaid Other | Source: Ambulatory Visit | Attending: Gastroenterology | Admitting: Gastroenterology

## 2014-10-03 ENCOUNTER — Ambulatory Visit: Payer: Medicaid Other

## 2014-10-03 ENCOUNTER — Other Ambulatory Visit: Payer: Self-pay

## 2014-10-03 ENCOUNTER — Encounter
Admission: EM | Disposition: A | Discharge status: Home / Self Care | Payer: Self-pay | Source: Home / Self Care | Attending: Emergency Medicine

## 2014-10-03 ENCOUNTER — Emergency Department
Admission: EM | Admit: 2014-10-03 | Discharge: 2014-10-03 | Disposition: A | Discharge status: Home / Self Care | Payer: Medicaid Other | Source: Home / Self Care | Attending: Emergency Medicine | Admitting: Emergency Medicine

## 2014-10-03 ENCOUNTER — Ambulatory Visit: Admission: RE | Admit: 2014-10-03 | Payer: Medicaid Other | Source: Ambulatory Visit | Admitting: Gastroenterology

## 2014-10-03 DIAGNOSIS — R1084 Generalized abdominal pain: Secondary | ICD-10-CM

## 2014-10-03 HISTORY — PX: ERCP: SHX5425

## 2014-10-03 LAB — COMPREHENSIVE METABOLIC PANEL
ALT: 10 U/L — ABNORMAL LOW (ref 14–54)
AST: 30 U/L (ref 15–41)
Albumin: 3.5 g/dL (ref 3.5–5.0)
Alkaline Phosphatase: 64 U/L (ref 38–126)
Anion gap: 9 (ref 5–15)
BUN: 8 mg/dL (ref 6–20)
CO2: 27 mmol/L (ref 22–32)
Calcium: 9.7 mg/dL (ref 8.9–10.3)
Chloride: 104 mmol/L (ref 101–111)
Creatinine, Ser: 0.45 mg/dL (ref 0.44–1.00)
GFR calc Af Amer: >60 mL/min (ref >60)
GFR calc non Af Amer: >60 mL/min (ref >60)
Glucose, Bld: 101 mg/dL — ABNORMAL HIGH (ref 65–99)
Potassium: 3.7 mmol/L (ref 3.5–5.1)
Sodium: 140 mmol/L (ref 135–145)
Total Bilirubin: 1.5 mg/dL — ABNORMAL HIGH (ref 0.3–1.2)
Total Protein: 7.8 g/dL (ref 6.5–8.1)

## 2014-10-03 LAB — CBC
HCT: 34.5 % — ABNORMAL LOW (ref 35.0–47.0)
Hemoglobin: 11.2 g/dL — ABNORMAL LOW (ref 12.0–16.0)
MCH: 30 pg (ref 26.0–34.0)
MCHC: 32.5 g/dL (ref 32.0–36.0)
MCV: 92.1 fL (ref 80.0–100.0)
Platelets: 244 10*3/uL (ref 150–440)
RBC: 3.75 MIL/uL — ABNORMAL LOW (ref 3.80–5.20)
RDW: 17.7 % — ABNORMAL HIGH (ref 11.5–14.5)
WBC: 3.8 10*3/uL (ref 3.6–11.0)

## 2014-10-03 LAB — TROPONIN I: Troponin I: <0.03 ng/mL (ref <0.031)

## 2014-10-03 LAB — GLUCOSE, CAPILLARY: Glucose-Capillary: 95 mg/dL (ref 65–99)

## 2014-10-03 SURGERY — ERCP, WITH INTERVENTION IF INDICATED
Anesthesia: General

## 2014-10-03 MED ORDER — IPRATROPIUM-ALBUTEROL 0.5-2.5 (3) MG/3ML IN SOLN
3.0000 mL | Freq: Once | RESPIRATORY_TRACT | Status: AC
  Administered 2014-10-03: 3 mL via RESPIRATORY_TRACT

## 2014-10-03 MED ORDER — PROPOFOL 10 MG/ML IV BOLUS
INTRAVENOUS | Status: DC | PRN
  Administered 2014-10-03: 160 mg via INTRAVENOUS

## 2014-10-03 MED ORDER — OXYCODONE HCL 5 MG/5ML PO SOLN: 5.0000 mg | Freq: Once | ORAL | Status: DC | PRN

## 2014-10-03 MED ORDER — ONDANSETRON HCL 4 MG/2ML IJ SOLN
INTRAMUSCULAR | Status: AC
  Administered 2014-10-03: 4 mg via INTRAVENOUS
  Filled 2014-10-03: qty 2

## 2014-10-03 MED ORDER — SUCCINYLCHOLINE CHLORIDE 20 MG/ML IJ SOLN
INTRAMUSCULAR | Status: DC | PRN
  Administered 2014-10-03: 200 mg via INTRAVENOUS

## 2014-10-03 MED ORDER — IPRATROPIUM-ALBUTEROL 0.5-2.5 (3) MG/3ML IN SOLN: 3.0000 mL | Freq: Once | RESPIRATORY_TRACT | Status: AC

## 2014-10-03 MED ORDER — MIDAZOLAM HCL 2 MG/2ML IJ SOLN
INTRAMUSCULAR | Status: DC | PRN
  Administered 2014-10-03: 2 mg via INTRAVENOUS

## 2014-10-03 MED ORDER — SODIUM CHLORIDE 0.9 % IV SOLN: INTRAVENOUS | Status: DC

## 2014-10-03 MED ORDER — CEFAZOLIN SODIUM 1-5 GM-% IV SOLN
1.0000 g | Freq: Three times a day (TID) | INTRAVENOUS | Status: DC
  Administered 2014-10-03: 1 g via INTRAVENOUS
  Filled 2014-10-03 (×4): qty 50

## 2014-10-03 MED ORDER — SODIUM CHLORIDE 0.9 % IV SOLN
INTRAVENOUS | Status: DC
  Administered 2014-10-03 (×2): via INTRAVENOUS

## 2014-10-03 MED ORDER — LIDOCAINE HCL (CARDIAC) 20 MG/ML IV SOLN
INTRAVENOUS | Status: DC | PRN
  Administered 2014-10-03: 100 mg via INTRAVENOUS

## 2014-10-03 MED ORDER — OXYCODONE HCL 5 MG PO TABS: 5.0000 mg | ORAL_TABLET | Freq: Once | ORAL | Status: DC | PRN

## 2014-10-03 MED ORDER — FENTANYL CITRATE (PF) 100 MCG/2ML IJ SOLN
25.0000 ug | INTRAMUSCULAR | Status: DC | PRN
  Administered 2014-10-03 (×2): 50 ug via INTRAVENOUS

## 2014-10-03 MED ORDER — ONDANSETRON HCL 4 MG/2ML IJ SOLN
4.0000 mg | Freq: Once | INTRAMUSCULAR | Status: AC
  Administered 2014-10-03: 4 mg via INTRAVENOUS

## 2014-10-03 MED ORDER — ONDANSETRON HCL 4 MG/2ML IJ SOLN
INTRAMUSCULAR | Status: DC | PRN
  Administered 2014-10-03: 4 mg via INTRAVENOUS

## 2014-10-03 MED ORDER — FENTANYL CITRATE (PF) 100 MCG/2ML IJ SOLN
INTRAMUSCULAR | Status: DC | PRN
  Administered 2014-10-03: 100 ug via INTRAVENOUS

## 2014-10-03 NOTE — ED Notes (Signed)
Pt in with co chest pain since tonight, does have shob and nausea.

## 2014-10-03 NOTE — Discharge Instructions (Signed)
Abdominal Pain, Women °Abdominal (stomach, pelvic, or belly) pain can be caused by many things. It is important to tell your doctor: °· The location of the pain. °· Does it come and go or is it present all the time? °· Are there things that start the pain (eating certain foods, exercise)? °· Are there other symptoms associated with the pain (fever, nausea, vomiting, diarrhea)? °All of this is helpful to know when trying to find the cause of the pain. °CAUSES  °· Stomach: virus or bacteria infection, or ulcer. °· Intestine: appendicitis (inflamed appendix), regional ileitis (Crohn's disease), ulcerative colitis (inflamed colon), irritable bowel syndrome, diverticulitis (inflamed diverticulum of the colon), or cancer of the stomach or intestine. °· Gallbladder disease or stones in the gallbladder. °· Kidney disease, kidney stones, or infection. °· Pancreas infection or cancer. °· Fibromyalgia (pain disorder). °· Diseases of the female organs: °¨ Uterus: fibroid (non-cancerous) tumors or infection. °¨ Fallopian tubes: infection or tubal pregnancy. °¨ Ovary: cysts or tumors. °¨ Pelvic adhesions (scar tissue). °¨ Endometriosis (uterus lining tissue growing in the pelvis and on the pelvic organs). °¨ Pelvic congestion syndrome (female organs filling up with blood just before the menstrual period). °¨ Pain with the menstrual period. °¨ Pain with ovulation (producing an egg). °¨ Pain with an IUD (intrauterine device, birth control) in the uterus. °¨ Cancer of the female organs. °· Functional pain (pain not caused by a disease, may improve without treatment). °· Psychological pain. °· Depression. °DIAGNOSIS  °Your doctor will decide the seriousness of your pain by doing an examination. °· Blood tests. °· X-rays. °· Ultrasound. °· CT scan (computed tomography, special type of X-ray). °· MRI (magnetic resonance imaging). °· Cultures, for infection. °· Barium enema (dye inserted in the large intestine, to better view it with  X-rays). °· Colonoscopy (looking in intestine with a lighted tube). °· Laparoscopy (minor surgery, looking in abdomen with a lighted tube). °· Major abdominal exploratory surgery (looking in abdomen with a large incision). °TREATMENT  °The treatment will depend on the cause of the pain.  °· Many cases can be observed and treated at home. °· Over-the-counter medicines recommended by your caregiver. °· Prescription medicine. °· Antibiotics, for infection. °· Birth control pills, for painful periods or for ovulation pain. °· Hormone treatment, for endometriosis. °· Nerve blocking injections. °· Physical therapy. °· Antidepressants. °· Counseling with a psychologist or psychiatrist. °· Minor or major surgery. °HOME CARE INSTRUCTIONS  °· Do not take laxatives, unless directed by your caregiver. °· Take over-the-counter pain medicine only if ordered by your caregiver. Do not take aspirin because it can cause an upset stomach or bleeding. °· Try a clear liquid diet (broth or water) as ordered by your caregiver. Slowly move to a bland diet, as tolerated, if the pain is related to the stomach or intestine. °· Have a thermometer and take your temperature several times a day, and record it. °· Bed rest and sleep, if it helps the pain. °· Avoid sexual intercourse, if it causes pain. °· Avoid stressful situations. °· Keep your follow-up appointments and tests, as your caregiver orders. °· If the pain does not go away with medicine or surgery, you may try: °¨ Acupuncture. °¨ Relaxation exercises (yoga, meditation). °¨ Group therapy. °¨ Counseling. °SEEK MEDICAL CARE IF:  °· You notice certain foods cause stomach pain. °· Your home care treatment is not helping your pain. °· You need stronger pain medicine. °· You want your IUD removed. °· You feel faint or   lightheaded. °· You develop nausea and vomiting. °· You develop a rash. °· You are having side effects or an allergy to your medicine. °SEEK IMMEDIATE MEDICAL CARE IF:  °· Your  pain does not go away or gets worse. °· You have a fever. °· Your pain is felt only in portions of the abdomen. The right side could possibly be appendicitis. The left lower portion of the abdomen could be colitis or diverticulitis. °· You are passing blood in your stools (bright red or black tarry stools, with or without vomiting). °· You have blood in your urine. °· You develop chills, with or without a fever. °· You pass out. °MAKE SURE YOU:  °· Understand these instructions. °· Will watch your condition. °· Will get help right away if you are not doing well or get worse. °Document Released: 10/17/2006 Document Revised: 05/06/2013 Document Reviewed: 11/06/2008 °Exit[employer]® Patient Information ©2015 [employer]. This information is not intended to replace advice given to you by your health care provider. Make sure you discuss any questions you have with your health care provider. ° °

## 2014-10-03 NOTE — Anesthesia Preprocedure Evaluation (Addendum)
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 Dental  (+) Missing, Chipped, Poor Dentition Missing right upper front lateral tooth:   Pulmonary asthma , sleep apnea, Continuous Positive Airway Pressure Ventilation and Oxygen sleep apnea , COPD,  COPD inhaler and oxygen dependent,  Uses 1.5-2 L/minutes Oliver PRN.   Pulmonary exam normal breath sounds clear to auscultation       Cardiovascular Exercise Tolerance: Poor hypertension, On Medications Normal cardiovascular exam Rhythm:regular Rate:Normal     Neuro/Psych PSYCHIATRIC DISORDERS Anxiety Depression TIA   GI/Hepatic negative GI ROS, GERD  Medicated and Controlled,(+) Hepatitis -, Unspecified  Endo/Other  diabetes, Well Controlled, Type 2, Oral Hypoglycemic AgentsHypothyroidism Morbid obesity  Renal/GU Renal disease  negative genitourinary   Musculoskeletal   Abdominal (+) + obese,   Peds  Hematology negative hematology ROS (+)   Anesthesia Other Findings Past Medical History:   Brittle bone disease                                         Asthma                                                       Thyroid disease                                              Hypertension                                                 Back pain                                                    Diabetes mellitus without complication                       Chronic kidney disease                                       Anxiety                                                      GERD (gastroesophageal reflux disease)  Sleep apnea                                                  COPD (chronic obstructive pulmonary disease)                 Hypothyroidism                                               Cervical disc disease                                        TIA (transient ischemic attack)                              Cancer                                                         Comment:Uteriine  ca 33yr ago partial hysterectomy   Collagen vascular disease                                      Comment:RA  3-4 yrs ago   Ascites                                                      NASH (nonalcoholic steatohepatitis)                          Reproductive/Obstetrics negative OB ROS                           Anesthesia Physical  Anesthesia Plan  ASA: III and emergent  Anesthesia Plan: General ETT   Post-op Pain Management:    Induction: Intravenous, Rapid sequence and Cricoid pressure planned  Airway Management Planned: Oral ETT  Additional Equipment:   Intra-op Plan:   Post-operative Plan: Extubation in OR  Informed Consent: I have reviewed the patients History and Physical, chart, labs and discussed the procedure including the risks, benefits and alternatives for the proposed anesthesia with the patient or authorized representative who has indicated his/her understanding and acceptance.   Dental Advisory Given  Plan Discussed with: CRNA, Surgeon and Anesthesiologist  Anesthesia Plan Comments: (Patient seen in the ED last night for chest pain and cleared from a cardiac standpoint.  Initial reports was that patient had eaten some biscuit this morning.  Patient reports that she spit the biscuit out because she did not like the taste.  She reports that she is NPO today.  NPO guidelines explained to patient and son including risk for aspiration if stomach is not empty.  Patient voiced understanding  and again insisted that she has had nothing to eat. Patient  informed that they are higher risk for complications from anesthesia during this procedure due to their medical history.  Patient voiced understanding. )     Anesthesia Quick Evaluation

## 2014-10-03 NOTE — Anesthesia Procedure Notes (Signed)
Procedure Name: Intubation Date/Time: 10/03/2014 12:09 PM Performed by: Silvana Newness Pre-anesthesia Checklist: Patient identified, Emergency Drugs available, Suction available, Patient being monitored and Timeout performed Patient Re-evaluated:Patient Re-evaluated prior to inductionOxygen Delivery Method: Circle system utilized Preoxygenation: Pre-oxygenation with 100% oxygen Intubation Type: Cricoid Pressure applied and Rapid sequence Laryngoscope Size: McGraph and 3 Grade View: Grade I Tube type: Oral Tube size: 7.0 mm Number of attempts: 1 Airway Equipment and Method: Rigid stylet Placement Confirmation: ETT inserted through vocal cords under direct vision,  positive ETCO2 and breath sounds checked- equal and bilateral Secured at: 20 cm Tube secured with: Tape Dental Injury: Teeth and Oropharynx as per pre-operative assessment

## 2014-10-03 NOTE — Op Note (Signed)
[facility] Gastroenterology Patient Name: Lori Mckenzie Procedure Date: 10/03/2014 12:00 PM MRN: [id number] Account #: [id number] Date of Birth: 10-16-1965 Admit Type: Outpatient Age: 49 Room: [facility] ENDO ROOM 4 Gender: Female Note Status: Finalized Procedure:         ERCP Indications:       Abdominal pain in the right lower quadrant, Abdominal pain                     in the left lower quadrant, Stent removal, hx of biloma Providers:         Lupita Dawn. Candace Cruise, MD Referring MD:      Neldon Labella Ashkin, MD (Referring MD) Medicines:         General Anesthesia Complications:     No immediate complications. Procedure:         Pre-Anesthesia Assessment:                    - Prior to the procedure, a History and Physical was                     performed, and patient medications, allergies and                     sensitivities were reviewed. The patient's tolerance of                     previous anesthesia was reviewed.                    - The risks and benefits of the procedure and the sedation                     options and risks were discussed with the patient. All                     questions were answered and informed consent was obtained.                    - After reviewing the risks and benefits, the patient was                     deemed in satisfactory condition to undergo the procedure.                    After obtaining informed consent, the scope was passed                     under direct vision. Throughout the procedure, the                     patient's blood pressure, pulse, and oxygen saturations                     were monitored continuously. The Olympus TJF-160F                     duodenoscope (J#[id number]) was introduced through the mouth,                     and used to inject contrast into and used to inject                     contrast into the bile duct. The ERCP was accomplished  without difficulty. The patient tolerated the procedure                     well. Findings:      The scout film was normal. The scope was passed under direct vision       through the upper GI tract. A medium amount of food (residue) was found       in the gastric body. The upper GI exam was otherwise without       abnormality. Food in stomach despite pt insisting that she did not eat       this AM. The major papilla was normal. One stent was removed from the       biliary tree using a snare. Sphinterotome cannulated into bile duct. CBD       looked completely normal. There is no evidence of bile leak right now. I       personally interpreted the images. Impression:        - Chronic and recurrent abdominal pain was not related to                     biliary stent.                    - A medium amount of food (residue) in the stomach.                    - The major papilla appeared normal.                    - One stent was removed from the biliary tree.                    - No specimens collected.                    - Complete healing of biloma. Recommendation:    - Discharge patient to home.                    - Observe patient's clinical course.                    - The findings and recommendations were discussed with the                     patient. Procedure Code(s): --- Professional ---                    [phone], Endoscopic retrograde cholangiopancreatography                     (ERCP); with removal of foreign body(s) or stent(s) from                     biliary/pancreatic duct(s) Diagnosis Code(s): --- Professional ---                    A21.30, Encounter for fitting and adjustment of other                     gastrointestinal appliance and device                    R10.31, Right lower quadrant pain                    R10.32, Left lower quadrant pain CPT  copyright 2014 American Medical Association. All rights reserved. The codes documented in this report are preliminary and upon coder review may  be revised to meet current compliance  requirements. Hulen Luster, MD 10/03/2014 12:46:25 PM This report has been signed electronically. Number of Addenda: 0 Note Initiated On: 10/03/2014 12:00 PM      [facility]

## 2014-10-03 NOTE — ED Provider Notes (Signed)
[facility] Emergency Department Provider Note  ____________________________________________  Time seen: 8 AM  I have reviewed the triage vital signs and the nursing notes.   HISTORY  Chief Complaint Chest Pain    HPI Lori Mckenzie is a 49 y.o. female who presents with mild chest discomfort and abdominal swelling. She denies fevers chills. She describes postop complication of a cholecystectomy in July. Review of records demonstrates that she was recently admitted for spontaneous bacterial peritonitis. It is unclear the cause of her ascites although she does have [name]. She was to follow up with GI today but she was in the hospital with her husband so decided to come down to the emergency department. She reports she thinks her ascites is coming back     Past Medical History  Diagnosis Date  . Brittle bone disease   . Asthma   . Thyroid disease   . Hypertension   . Back pain   . Diabetes mellitus without complication   . Chronic kidney disease   . Anxiety   . GERD (gastroesophageal reflux disease)   . Sleep apnea   . COPD (chronic obstructive pulmonary disease)   . Hypothyroidism   . Cervical disc disease   . TIA (transient ischemic attack)   . Cancer     Uteriine  ca 33yr ago partial hysterectomy  . Collagen vascular disease     RA  3-4 yrs ago  . Ascites   . NASH (nonalcoholic steatohepatitis)     Patient Active Problem List   Diagnosis Date Noted  . Ascites 09/05/2014  . NASH (nonalcoholic steatohepatitis) 09/05/2014  . Hypokalemia 09/05/2014  . Abdominal abscess 08/25/2014  . Dysthymia 08/05/2014  . Other social stressor 08/05/2014  . Abscess of abdominal cavity 08/03/2014  . Bile leak, postoperative 07/17/2014  . Steatohepatitis 07/15/2014  . Cholecystitis   . Biliary colic 058/09/9831 . Abdominal pain   . Type 2 diabetes mellitus 07/12/2014  . COPD (chronic obstructive pulmonary disease) 07/12/2014  . HTN (hypertension) 07/12/2014   . GERD (gastroesophageal reflux disease) 07/12/2014  . OSA on CPAP 07/12/2014  . Anxiety 07/12/2014    Past Surgical History  Procedure Laterality Date  . Abdominal hysterectomy    . Tubal ligation    . Colonoscopy with propofol N/A 06/23/2014    Procedure: COLONOSCOPY WITH PROPOFOL;  Surgeon: MLollie Sails MD;  [facility]  Service: Endoscopy;  Laterality: N/A;  . Esophagogastroduodenoscopy N/A 06/23/2014    Procedure: ESOPHAGOGASTRODUODENOSCOPY (EGD);  Surgeon: MLollie Sails MD;  [facility];  Service: Endoscopy;  Laterality: N/A;  . Cholecystectomy N/A 07/15/2014    Procedure: LAPAROSCOPIC CHOLECYSTECTOMY with liver biopsy ;  Surgeon: MSherri Rad MD;  Location: [facility] ORS;  Service: General;  Laterality: N/A;  . Ercp N/A 07/16/2014    Procedure: ENDOSCOPIC RETROGRADE CHOLANGIOPANCREATOGRAPHY (ERCP);  Surgeon: MClarene Essex MD;  [facility];  Service: Endoscopy;  Laterality: N/A;  . Wisdom tooth extraction      Current Outpatient Rx  Name  Route  Sig  Dispense  Refill  . albuterol (PROVENTIL HFA;VENTOLIN HFA) 108 (90 BASE) MCG/ACT inhaler   Inhalation   Inhale 2 puffs into the lungs every 4 (four) hours as needed for wheezing or shortness of breath.         . ALPRAZolam (XANAX) 1 MG tablet   Oral   Take 1 tablet (1 mg total) by mouth 3 (three) times daily as needed for anxiety. Patient taking differently: Take 2 mg  by mouth 3 (three) times daily.    30 tablet   0   . cefdinir (OMNICEF) 300 MG capsule   Oral   Take 300 mg by mouth 2 (two) times daily.         . citalopram (CELEXA) 20 MG tablet   Oral   Take 20 mg by mouth at bedtime.          . clotrimazole (MYCELEX) 10 MG troche   Oral   Take 10 mg by mouth 4 (four) times daily as needed (for dry mouth).          . famotidine (PEPCID) 20 MG tablet   Oral   Take 20 mg by mouth 2 (two) times daily.         . fluticasone (FLONASE) 50 MCG/ACT nasal spray   Each Nare    Place 1 spray into both nostrils daily as needed for rhinitis.          . furosemide (LASIX) 40 MG tablet   Oral   Take 80 mg by mouth 2 (two) times daily.          Marland Kitchen gabapentin (NEURONTIN) 300 MG capsule   Oral   Take 600 mg by mouth 3 (three) times daily.          Marland Kitchen ipratropium (ATROVENT HFA) 17 MCG/ACT inhaler   Inhalation   Inhale 2 puffs into the lungs every 8 (eight) hours.         Marland Kitchen lactulose (CEPHULAC) 20 G packet   Oral   Take 1 packet by mouth 2 (two) times daily.         . metFORMIN (GLUCOPHAGE) 1000 MG tablet   Oral   Take 500-1,000 mg by mouth 2 (two) times daily as needed (for high blood sugar).         Marland Kitchen omeprazole (PRILOSEC) 20 MG capsule   Oral   Take 40 mg by mouth 2 (two) times daily as needed (for acid reflux).          . Oxycodone HCl 10 MG TABS   Oral   Take 0.5 tablets (5 mg total) by mouth every 4 (four) hours as needed for severe pain.   20 tablet   0   . rizatriptan (MAXALT) 10 MG tablet   Oral   Take 10 mg by mouth as needed for migraine.            Allergies Tape and Vicodin  Family History  Problem Relation Age of Onset  . Lung cancer Father   . Ulcers Father   . Heart disease Sister   . Ulcers Sister   . Heart disease Brother     Social History Social History  Substance Use Topics  . Smoking status: Never Smoker   . Smokeless tobacco: Never Used  . Alcohol Use: No     Comment: occ    Review of Systems  Constitutional: Negative for fever. Eyes: Negative for visual changes. ENT: Negative for sore throat Cardiovascular: Mild chest discomfort Respiratory: Negative for shortness of breath. Gastrointestinal: Positive for abdominal swelling Genitourinary: Negative for dysuria. Musculoskeletal: Negative for back pain. Skin: Negative for rash. Neurological: Negative for headaches or focal weakness Psychiatric: Positive for anxiety    ____________________________________________   PHYSICAL EXAM:  VITAL  SIGNS: ED Triage Vitals  Enc Vitals Group     BP 10/03/14 0627 116/72 mmHg     Pulse Rate 10/03/14 0627 88     Resp 10/03/14 0627 18  Temp 10/03/14 0627 98.1 F (36.7 C)     Temp Source 10/03/14 0627 Oral     SpO2 10/03/14 0627 99 %     Weight 10/03/14 0627 250 lb (113.399 kg)     Height 10/03/14 0627 5' 2"  (1.575 m)     Head Cir --      Peak Flow --      Pain Score 10/03/14 0627 10     Pain Loc --      Pain Edu? --      Excl. in [place]? --      Constitutional: Alert and oriented. No acute distress Eyes: Conjunctivae are normal.  ENT   Head: Normocephalic and atraumatic.   Mouth/Throat: Mucous membranes are moist. Cardiovascular: Normal rate, regular rhythm. Normal and symmetric distal pulses are present in all extremities. No murmurs, rubs, or gallops. Respiratory: Normal respiratory effort without tachypnea nor retractions. Breath sounds are clear and equal bilaterally.  Gastrointestinal: Abdomen slightly distended likely secondary to ascites. There is no significant tenderness to palpation. There is no CVA tenderness. Genitourinary: deferred Musculoskeletal: Nontender with normal range of motion in all extremities. No lower extremity tenderness nor edema. Neurologic:  Normal speech and language. No gross focal neurologic deficits are appreciated. Skin:  Skin is warm, dry and intact. No rash noted. Psychiatric: Mood and affect are normal. Patient exhibits appropriate insight and judgment.  ____________________________________________    LABS (pertinent positives/negatives)  Labs Reviewed  CBC - Abnormal; Notable for the following:    RBC 3.75 (*)    Hemoglobin 11.2 (*)    HCT 34.5 (*)    RDW 17.7 (*)    All other components within normal limits  COMPREHENSIVE METABOLIC PANEL - Abnormal; Notable for the following:    Glucose, Bld 101 (*)    ALT 10 (*)    Total Bilirubin 1.5 (*)    All other components within normal limits  TROPONIN I     ____________________________________________   EKG  ED ECG REPORT I, Lavonia Drafts, the attending physician, personally viewed and interpreted this ECG.  Date: 10/03/2014 EKG Time: 6:31 AM Rate: 92 Rhythm: normal sinus rhythm QRS Axis: normal Intervals: normal ST/T Wave abnormalities: normal Conduction Disutrbances: none Narrative Interpretation: unremarkable   ____________________________________________    RADIOLOGY I have personally reviewed any xrays that were ordered on this patient: None  ____________________________________________   PROCEDURES  Procedure(s) performed: none  Critical Care performed: none  ____________________________________________   INITIAL IMPRESSION / ASSESSMENT AND PLAN / ED COURSE  Pertinent labs & imaging results that were available during my care of the patient were reviewed by me and considered in my medical decision making (see chart for details).  Patient with postop complication from cholecystectomy. She also developed SBP for which she was admitted earlier in the month. Today she has no evidence of infection her white blood cell count is normal. She has no fever and her heart rate is normal as well. Her abdomen is mildly distended but her oxygen saturation is normal and she is not tachypneic. Thus far her labs look unremarkable. We will discuss with gastroenterology whose appointment she missed because she came to the emergency department  I discussed with Dr. Allen Norris of GI who believes patient's continued discomfort is likely related to bile stent which she was supposed to have removed today him a he recommends having this removed to see if it improves her pain. We have called to the GI office and they will try to get her in  today or if not today then on Monday. I discussed with the patient and the family and they agreed to go over to GI to arrange for the procedure  ____________________________________________   FINAL  CLINICAL IMPRESSION(S) / ED DIAGNOSES  Final diagnoses:  Generalized abdominal pain     Lavonia Drafts, MD 10/03/14 1434

## 2014-10-03 NOTE — Transfer of Care (Signed)
Immediate Anesthesia Transfer of Care Note  Patient: Lori Mckenzie  Procedure(s) Performed: Procedure(s): ENDOSCOPIC RETROGRADE CHOLANGIOPANCREATOGRAPHY (ERCP) (N/A)  Patient Location: PACU  Anesthesia Type:General  Level of Consciousness: awake, alert , oriented and patient cooperative  Airway & Oxygen Therapy: Patient Spontanous Breathing and Patient connected to face mask oxygen  Post-op Assessment: Report given to RN, Post -op Vital signs reviewed and stable and Patient moving all extremities X 4  Post vital signs: Reviewed and stable  Last Vitals:  Filed Vitals:   10/03/14 1259  BP: 119/80  Pulse: 87  Temp: 37.7 C  Resp: 17    Complications: No apparent anesthesia complications

## 2014-10-03 NOTE — H&P (Signed)
  Date of Initial H&P: 09/10/2014  History reviewed, patient examined, no change in status, stable for surgery.

## 2014-10-03 NOTE — Brief Op Note (Signed)
Received patient from PACU.  Discharge instructions to be given to patient and family member.

## 2014-10-04 ENCOUNTER — Emergency Department: Payer: Medicaid Other

## 2014-10-04 ENCOUNTER — Inpatient Hospital Stay
Admission: EM | Admit: 2014-10-04 | Discharge: 2014-10-06 | DRG: 871 | Disposition: A | Discharge status: Home Health | Payer: Medicaid Other | Attending: Internal Medicine | Admitting: Internal Medicine

## 2014-10-04 ENCOUNTER — Encounter: Payer: Self-pay | Admitting: *Deleted

## 2014-10-04 ENCOUNTER — Inpatient Hospital Stay: Payer: Medicaid Other

## 2014-10-04 DIAGNOSIS — R188 Other ascites: Secondary | ICD-10-CM | POA: Diagnosis not present

## 2014-10-04 DIAGNOSIS — I129 Hypertensive chronic kidney disease with stage 1 through stage 4 chronic kidney disease, or unspecified chronic kidney disease: Secondary | ICD-10-CM | POA: Diagnosis present

## 2014-10-04 DIAGNOSIS — K652 Spontaneous bacterial peritonitis: Secondary | ICD-10-CM | POA: Diagnosis not present

## 2014-10-04 DIAGNOSIS — E039 Hypothyroidism, unspecified: Secondary | ICD-10-CM | POA: Diagnosis present

## 2014-10-04 DIAGNOSIS — R1084 Generalized abdominal pain: Secondary | ICD-10-CM | POA: Diagnosis present

## 2014-10-04 DIAGNOSIS — J45909 Unspecified asthma, uncomplicated: Secondary | ICD-10-CM | POA: Diagnosis present

## 2014-10-04 DIAGNOSIS — J449 Chronic obstructive pulmonary disease, unspecified: Secondary | ICD-10-CM | POA: Diagnosis present

## 2014-10-04 DIAGNOSIS — Z8673 Personal history of transient ischemic attack (TIA), and cerebral infarction without residual deficits: Secondary | ICD-10-CM

## 2014-10-04 DIAGNOSIS — K7581 Nonalcoholic steatohepatitis (NASH): Secondary | ICD-10-CM | POA: Diagnosis present

## 2014-10-04 DIAGNOSIS — R509 Fever, unspecified: Secondary | ICD-10-CM

## 2014-10-04 DIAGNOSIS — K219 Gastro-esophageal reflux disease without esophagitis: Secondary | ICD-10-CM | POA: Diagnosis present

## 2014-10-04 DIAGNOSIS — G4733 Obstructive sleep apnea (adult) (pediatric): Secondary | ICD-10-CM | POA: Diagnosis present

## 2014-10-04 DIAGNOSIS — D72819 Decreased white blood cell count, unspecified: Secondary | ICD-10-CM | POA: Diagnosis present

## 2014-10-04 DIAGNOSIS — N189 Chronic kidney disease, unspecified: Secondary | ICD-10-CM | POA: Diagnosis present

## 2014-10-04 DIAGNOSIS — A419 Sepsis, unspecified organism: Secondary | ICD-10-CM | POA: Diagnosis present

## 2014-10-04 DIAGNOSIS — E119 Type 2 diabetes mellitus without complications: Secondary | ICD-10-CM | POA: Diagnosis present

## 2014-10-04 DIAGNOSIS — R6510 Systemic inflammatory response syndrome (SIRS) of non-infectious origin without acute organ dysfunction: Secondary | ICD-10-CM

## 2014-10-04 LAB — CBC WITH DIFFERENTIAL/PLATELET
Basophils Absolute: 0 10*3/uL (ref 0–0.1)
Basophils Relative: 0 %
Eosinophils Absolute: 0.1 10*3/uL (ref 0–0.7)
Eosinophils Relative: 1 %
HCT: 31.1 % — ABNORMAL LOW (ref 35.0–47.0)
Hemoglobin: 10.4 g/dL — ABNORMAL LOW (ref 12.0–16.0)
Lymphocytes Relative: 5 %
Lymphs Abs: 0.3 10*3/uL — ABNORMAL LOW (ref 1.0–3.6)
MCH: 30.7 pg (ref 26.0–34.0)
MCHC: 33.6 g/dL (ref 32.0–36.0)
MCV: 91.4 fL (ref 80.0–100.0)
Monocytes Absolute: 0.4 10*3/uL (ref 0.2–0.9)
Monocytes Relative: 7 %
Neutro Abs: 4.8 10*3/uL (ref 1.4–6.5)
Neutrophils Relative %: 87 %
Platelets: 258 10*3/uL (ref 150–440)
RBC: 3.4 MIL/uL — ABNORMAL LOW (ref 3.80–5.20)
RDW: 17.3 % — ABNORMAL HIGH (ref 11.5–14.5)
WBC: 5.6 10*3/uL (ref 3.6–11.0)

## 2014-10-04 LAB — LACTIC ACID, PLASMA
Lactic Acid, Venous: 1.2 mmol/L (ref 0.5–2.0)
Lactic Acid, Venous: 0.9 mmol/L (ref 0.5–2.0)

## 2014-10-04 LAB — GLUCOSE, CAPILLARY
Glucose-Capillary: 68 mg/dL (ref 65–99)
Glucose-Capillary: 84 mg/dL (ref 65–99)
Glucose-Capillary: 74 mg/dL (ref 65–99)

## 2014-10-04 LAB — BODY FLUID CELL COUNT WITH DIFFERENTIAL
Eos, Fluid: 0 %
Lymphs, Fluid: 29 %
Monocyte-Macrophage-Serous Fluid: 45 %
Neutrophil Count, Fluid: 26 %
Other Cells, Fluid: 0 %
Total Nucleated Cell Count, Fluid: 771 cu mm

## 2014-10-04 LAB — URINALYSIS COMPLETE WITH MICROSCOPIC (ARMC ONLY)
Bilirubin Urine: NEGATIVE
Glucose, UA: NEGATIVE mg/dL
Hgb urine dipstick: NEGATIVE
Ketones, ur: NEGATIVE mg/dL
Leukocytes, UA: NEGATIVE
Nitrite: NEGATIVE
Protein, ur: NEGATIVE mg/dL
Specific Gravity, Urine: 1.014 (ref 1.005–1.030)
pH: 5 (ref 5.0–8.0)

## 2014-10-04 LAB — COMPREHENSIVE METABOLIC PANEL
ALT: 12 U/L — ABNORMAL LOW (ref 14–54)
AST: 21 U/L (ref 15–41)
Albumin: 3.2 g/dL — ABNORMAL LOW (ref 3.5–5.0)
Alkaline Phosphatase: 54 U/L (ref 38–126)
Anion gap: 5 (ref 5–15)
BUN: 8 mg/dL (ref 6–20)
CO2: 27 mmol/L (ref 22–32)
Calcium: 8.6 mg/dL — ABNORMAL LOW (ref 8.9–10.3)
Chloride: 106 mmol/L (ref 101–111)
Creatinine, Ser: 0.62 mg/dL (ref 0.44–1.00)
GFR calc Af Amer: >60 mL/min (ref >60)
GFR calc non Af Amer: >60 mL/min (ref >60)
Glucose, Bld: 132 mg/dL — ABNORMAL HIGH (ref 65–99)
Potassium: 3.2 mmol/L — ABNORMAL LOW (ref 3.5–5.1)
Sodium: 138 mmol/L (ref 135–145)
Total Bilirubin: 1.6 mg/dL — ABNORMAL HIGH (ref 0.3–1.2)
Total Protein: 6.8 g/dL (ref 6.5–8.1)

## 2014-10-04 MED ORDER — INSULIN ASPART 100 UNIT/ML ~~LOC~~ SOLN: 0.0000 [IU] | Freq: Every day | SUBCUTANEOUS | Status: DC

## 2014-10-04 MED ORDER — ACETAMINOPHEN 325 MG PO TABS: 650.0000 mg | ORAL_TABLET | Freq: Four times a day (QID) | ORAL | Status: DC | PRN

## 2014-10-04 MED ORDER — FLUTICASONE PROPIONATE 50 MCG/ACT NA SUSP
1.0000 | Freq: Every day | NASAL | Status: DC | PRN
  Administered 2014-10-06: 1 via NASAL
  Filled 2014-10-04: qty 16

## 2014-10-04 MED ORDER — ACETAMINOPHEN 325 MG PO TABS
650.0000 mg | ORAL_TABLET | Freq: Once | ORAL | Status: AC | PRN
  Administered 2014-10-04: 650 mg via ORAL
  Filled 2014-10-04: qty 2

## 2014-10-04 MED ORDER — SODIUM CHLORIDE 0.9 % IV SOLN
Freq: Once | INTRAVENOUS | Status: AC
Start: 2014-10-04 — End: 2014-10-04
  Administered 2014-10-04: 06:00:00 via INTRAVENOUS

## 2014-10-04 MED ORDER — CLOTRIMAZOLE 10 MG MT TROC: 10.0000 mg | Freq: Four times a day (QID) | OROMUCOSAL | Status: DC | PRN

## 2014-10-04 MED ORDER — LACTULOSE 10 GM/15ML PO SOLN
20.0000 g | Freq: Two times a day (BID) | ORAL | Status: DC
  Administered 2014-10-05 (×2): 20 g via ORAL
  Filled 2014-10-04 (×2): qty 30

## 2014-10-04 MED ORDER — NYSTATIN 100000 UNIT/GM EX POWD: Freq: Three times a day (TID) | CUTANEOUS | Status: DC

## 2014-10-04 MED ORDER — ALPRAZOLAM 1 MG PO TABS
1.0000 mg | ORAL_TABLET | Freq: Three times a day (TID) | ORAL | Status: DC | PRN
  Administered 2014-10-04 – 2014-10-06 (×3): 1 mg via ORAL
  Filled 2014-10-04 (×3): qty 1

## 2014-10-04 MED ORDER — PIPERACILLIN-TAZOBACTAM 3.375 G IVPB
3.3750 g | Freq: Three times a day (TID) | INTRAVENOUS | Status: DC
  Administered 2014-10-04 – 2014-10-06 (×6): 3.375 g via INTRAVENOUS
  Filled 2014-10-04 (×10): qty 50

## 2014-10-04 MED ORDER — ONDANSETRON HCL 4 MG/2ML IJ SOLN
4.0000 mg | Freq: Once | INTRAMUSCULAR | Status: AC
  Administered 2014-10-04: 4 mg via INTRAVENOUS
  Filled 2014-10-04: qty 2

## 2014-10-04 MED ORDER — SODIUM CHLORIDE 0.9 % IV SOLN: 250.0000 mL | INTRAVENOUS | Status: DC | PRN

## 2014-10-04 MED ORDER — ALBUTEROL SULFATE HFA 108 (90 BASE) MCG/ACT IN AERS: 2.0000 | INHALATION_SPRAY | RESPIRATORY_TRACT | Status: DC | PRN

## 2014-10-04 MED ORDER — ALBUTEROL SULFATE (2.5 MG/3ML) 0.083% IN NEBU: 2.5000 mg | INHALATION_SOLUTION | RESPIRATORY_TRACT | Status: DC | PRN

## 2014-10-04 MED ORDER — SODIUM CHLORIDE 0.9 % IJ SOLN: 3.0000 mL | INTRAMUSCULAR | Status: DC | PRN

## 2014-10-04 MED ORDER — SODIUM CHLORIDE 0.9 % IV BOLUS (SEPSIS)
1000.0000 mL | Freq: Once | INTRAVENOUS | Status: AC
  Administered 2014-10-04: 1000 mL via INTRAVENOUS

## 2014-10-04 MED ORDER — PANTOPRAZOLE SODIUM 40 MG PO TBEC
40.0000 mg | DELAYED_RELEASE_TABLET | Freq: Every day | ORAL | Status: DC
  Administered 2014-10-04 – 2014-10-06 (×3): 40 mg via ORAL
  Filled 2014-10-04 (×3): qty 1

## 2014-10-04 MED ORDER — IOHEXOL 300 MG/ML  SOLN
100.0000 mL | Freq: Once | INTRAMUSCULAR | Status: AC | PRN
  Administered 2014-10-04: 100 mL via INTRAVENOUS

## 2014-10-04 MED ORDER — ONDANSETRON HCL 4 MG PO TABS: 4.0000 mg | ORAL_TABLET | Freq: Four times a day (QID) | ORAL | Status: DC | PRN

## 2014-10-04 MED ORDER — HYDROMORPHONE HCL 1 MG/ML IJ SOLN
0.5000 mg | INTRAMUSCULAR | Status: DC | PRN
  Administered 2014-10-04 (×2): 0.5 mg via INTRAVENOUS
  Filled 2014-10-04 (×2): qty 1

## 2014-10-04 MED ORDER — IPRATROPIUM BROMIDE HFA 17 MCG/ACT IN AERS: 2.0000 | INHALATION_SPRAY | Freq: Three times a day (TID) | RESPIRATORY_TRACT | Status: DC

## 2014-10-04 MED ORDER — SODIUM CHLORIDE 0.9 % IV SOLN
INTRAVENOUS | Status: DC
  Administered 2014-10-04 – 2014-10-06 (×4): via INTRAVENOUS

## 2014-10-04 MED ORDER — CITALOPRAM HYDROBROMIDE 20 MG PO TABS
20.0000 mg | ORAL_TABLET | Freq: Every day | ORAL | Status: DC
  Administered 2014-10-04 – 2014-10-05 (×2): 20 mg via ORAL
  Filled 2014-10-04 (×2): qty 1

## 2014-10-04 MED ORDER — IPRATROPIUM BROMIDE 0.02 % IN SOLN
0.5000 mg | Freq: Three times a day (TID) | RESPIRATORY_TRACT | Status: DC
  Administered 2014-10-04 – 2014-10-06 (×6): 0.5 mg via RESPIRATORY_TRACT
  Filled 2014-10-04 (×6): qty 2.5

## 2014-10-04 MED ORDER — FUROSEMIDE 40 MG PO TABS
80.0000 mg | ORAL_TABLET | Freq: Two times a day (BID) | ORAL | Status: DC
  Administered 2014-10-05: 80 mg via ORAL
  Filled 2014-10-04: qty 2

## 2014-10-04 MED ORDER — CIPROFLOXACIN IN D5W 400 MG/200ML IV SOLN
400.0000 mg | Freq: Two times a day (BID) | INTRAVENOUS | Status: DC
  Administered 2014-10-04 – 2014-10-06 (×5): 400 mg via INTRAVENOUS
  Filled 2014-10-04 (×7): qty 200

## 2014-10-04 MED ORDER — NYSTATIN 100000 UNIT/GM EX POWD
Freq: Three times a day (TID) | CUTANEOUS | Status: DC
  Administered 2014-10-04 – 2014-10-06 (×5): via TOPICAL
  Filled 2014-10-04 (×2): qty 15

## 2014-10-04 MED ORDER — IOHEXOL 240 MG/ML SOLN
25.0000 mL | Freq: Once | INTRAMUSCULAR | Status: AC | PRN
  Administered 2014-10-04: 25 mL via ORAL

## 2014-10-04 MED ORDER — ONDANSETRON HCL 4 MG/2ML IJ SOLN
INTRAMUSCULAR | Status: AC
  Administered 2014-10-04: 4 mg via INTRAVENOUS
  Filled 2014-10-04: qty 2

## 2014-10-04 MED ORDER — SODIUM CHLORIDE 0.9 % IJ SOLN
3.0000 mL | Freq: Two times a day (BID) | INTRAMUSCULAR | Status: DC
  Administered 2014-10-04 – 2014-10-05 (×2): 3 mL via INTRAVENOUS

## 2014-10-04 MED ORDER — MORPHINE SULFATE (PF) 4 MG/ML IV SOLN
4.0000 mg | Freq: Once | INTRAVENOUS | Status: AC
  Administered 2014-10-04: 4 mg via INTRAVENOUS
  Filled 2014-10-04 (×2): qty 1

## 2014-10-04 MED ORDER — HYDROMORPHONE HCL 1 MG/ML IJ SOLN
1.0000 mg | INTRAMUSCULAR | Status: DC | PRN
  Administered 2014-10-04 – 2014-10-06 (×10): 1 mg via INTRAVENOUS
  Filled 2014-10-04 (×10): qty 1

## 2014-10-04 MED ORDER — FAMOTIDINE 20 MG PO TABS
20.0000 mg | ORAL_TABLET | Freq: Two times a day (BID) | ORAL | Status: DC
  Administered 2014-10-04 – 2014-10-06 (×4): 20 mg via ORAL
  Filled 2014-10-04 (×4): qty 1

## 2014-10-04 MED ORDER — INSULIN ASPART 100 UNIT/ML ~~LOC~~ SOLN: 0.0000 [IU] | Freq: Three times a day (TID) | SUBCUTANEOUS | Status: DC

## 2014-10-04 MED ORDER — OXYCODONE HCL 5 MG PO TABS
5.0000 mg | ORAL_TABLET | ORAL | Status: DC | PRN
  Administered 2014-10-04 – 2014-10-05 (×2): 5 mg via ORAL
  Filled 2014-10-04 (×3): qty 1

## 2014-10-04 MED ORDER — GABAPENTIN 300 MG PO CAPS
600.0000 mg | ORAL_CAPSULE | Freq: Three times a day (TID) | ORAL | Status: DC
  Administered 2014-10-04 – 2014-10-06 (×7): 600 mg via ORAL
  Filled 2014-10-04 (×7): qty 2

## 2014-10-04 MED ORDER — HYDROMORPHONE HCL 1 MG/ML IJ SOLN
1.0000 mg | Freq: Once | INTRAMUSCULAR | Status: AC
Start: 2014-10-04 — End: 2014-10-04
  Administered 2014-10-04: 1 mg via INTRAVENOUS
  Filled 2014-10-04: qty 1

## 2014-10-04 MED ORDER — PIPERACILLIN-TAZOBACTAM 3.375 G IVPB
3.3750 g | Freq: Once | INTRAVENOUS | Status: AC
  Administered 2014-10-04: 3.375 g via INTRAVENOUS
  Filled 2014-10-04: qty 50

## 2014-10-04 MED ORDER — SPIRONOLACTONE 25 MG PO TABS
100.0000 mg | ORAL_TABLET | Freq: Every day | ORAL | Status: DC
  Administered 2014-10-04 – 2014-10-06 (×3): 100 mg via ORAL
  Filled 2014-10-04 (×3): qty 4

## 2014-10-04 MED ORDER — VANCOMYCIN HCL IN DEXTROSE 1-5 GM/200ML-% IV SOLN
1000.0000 mg | Freq: Once | INTRAVENOUS | Status: AC
Start: 2014-10-04 — End: 2014-10-04
  Administered 2014-10-04: 1000 mg via INTRAVENOUS
  Filled 2014-10-04: qty 200

## 2014-10-04 MED ORDER — NYSTATIN 100000 UNIT/GM EX POWD
Freq: Three times a day (TID) | CUTANEOUS | Status: DC
  Filled 2014-10-04: qty 15

## 2014-10-04 MED ORDER — LACTULOSE 20 G PO PACK: 20.0000 g | PACK | Freq: Two times a day (BID) | ORAL | Status: DC

## 2014-10-04 MED ORDER — ONDANSETRON HCL 4 MG/2ML IJ SOLN
4.0000 mg | Freq: Four times a day (QID) | INTRAMUSCULAR | Status: DC | PRN
  Administered 2014-10-04 – 2014-10-06 (×5): 4 mg via INTRAVENOUS
  Filled 2014-10-04 (×5): qty 2

## 2014-10-04 MED ORDER — IPRATROPIUM BROMIDE 0.02 % IN SOLN
0.5000 mg | Freq: Three times a day (TID) | RESPIRATORY_TRACT | Status: DC
  Filled 2014-10-04: qty 2.5

## 2014-10-04 NOTE — Anesthesia Postprocedure Evaluation (Signed)
  Anesthesia Post-op Note  Patient: Lori Mckenzie  Procedure(s) Performed: Procedure(s): ENDOSCOPIC RETROGRADE CHOLANGIOPANCREATOGRAPHY (ERCP) (N/A)  Anesthesia type:General ETT  Patient location: PACU  Post pain: Pain level controlled  Post assessment: Post-op Vital signs reviewed, Patient's Cardiovascular Status Stable, Respiratory Function Stable, Patent Airway and No signs of Nausea or vomiting  Post vital signs: Reviewed and stable  Last Vitals:  Filed Vitals:   10/03/14 1340  BP: 112/80  Pulse: 82  Temp:   Resp: 16    Level of consciousness: awake, alert  and patient cooperative  Complications: No apparent anesthesia complications

## 2014-10-04 NOTE — Procedures (Signed)
[place] para R lateral 190m orange fluid removed, sent for requested studies No complication No blood loss. [facility]

## 2014-10-04 NOTE — H&P (Signed)
Lori Mckenzie is an 49 y.o. female.   Chief Complaint: Abd pain HPI: She has had an extensive his of abd pain since cholacystectomy several weeks ago. Had biliary stent placed recently. Presented to ED yesterday with increasing abd pain and distention. Stent was removed and was sent home with follow up. During the night she awoke with severe chills and then fever. Presented back to ED with temp of 101.5 and tachycardia. No n/v or shortness of breath. Patient being admitted for presumed bacterial paratenitis.  Past Medical History  Diagnosis Date  . Brittle bone disease   . Asthma   . Thyroid disease   . Hypertension   . Back pain   . Diabetes mellitus without complication ([place])   . Chronic kidney disease   . Anxiety   . GERD (gastroesophageal reflux disease)   . Sleep apnea   . COPD (chronic obstructive pulmonary disease) ([place])   . Hypothyroidism   . Cervical disc disease   . TIA (transient ischemic attack)   . Cancer ([place])     Uteriine  ca 39yr ago partial hysterectomy  . [place] vascular [place]     RA  3-4 yrs ago  . Ascites   . NASH (nonalcoholic steatohepatitis)     Past Surgical History  Procedure Laterality Date  . Abdominal hysterectomy    . Tubal ligation    . Colonoscopy with propofol N/A 06/23/2014    Procedure: COLONOSCOPY WITH PROPOFOL;  Surgeon: MLollie Sails MD;  [facility];  Service: Endoscopy;  Laterality: N/A;  . Esophagogastroduodenoscopy N/A 06/23/2014    Procedure: ESOPHAGOGASTRODUODENOSCOPY (EGD);  Surgeon: MLollie Sails MD;  [facility];  Service: Endoscopy;  Laterality: N/A;  . Cholecystectomy N/A 07/15/2014    Procedure: LAPAROSCOPIC CHOLECYSTECTOMY with liver biopsy ;  Surgeon: MSherri Rad MD;  Location: [facility] ORS;  Service: General;  Laterality: N/A;  . Ercp N/A 07/16/2014    Procedure: ENDOSCOPIC RETROGRADE CHOLANGIOPANCREATOGRAPHY (ERCP);  Surgeon: MClarene Essex MD;  [facility];  Service: Endoscopy;   Laterality: N/A;  . Wisdom tooth extraction      Family History  Problem Relation Age of Onset  . Lung cancer Father   . Ulcers Father   . Heart disease Sister   . Ulcers Sister   . Heart disease Brother    Social History:  reports that she has never smoked. She has never used smokeless tobacco. She reports that she does not drink alcohol or use illicit drugs.  Allergies:  Allergies  Allergen Reactions  . Tape Swelling  . Vicodin [Hydrocodone-Acetaminophen] Itching and Rash     (Not in a hospital admission)  Results for orders placed or performed during the hospital encounter of 10/04/14 (from the past 48 hour(s))  Comprehensive metabolic panel     Status: Abnormal   Collection Time: 10/04/14  3:47 AM  Result Value Ref Range   Sodium 138 135 - 145 mmol/L   Potassium 3.2 (L) 3.5 - 5.1 mmol/L   Chloride 106 101 - 111 mmol/L   CO2 27 22 - 32 mmol/L   Glucose, Bld 132 (H) 65 - 99 mg/dL   BUN 8 6 - 20 mg/dL   Creatinine, Ser 0.62 0.44 - 1.00 mg/dL   Calcium 8.6 (L) 8.9 - 10.3 mg/dL   Total Protein 6.8 6.5 - 8.1 g/dL   Albumin 3.2 (L) 3.5 - 5.0 g/dL   AST 21 15 - 41 U/L   ALT 12 (L) 14 - 54 U/L  Alkaline Phosphatase 54 38 - 126 U/L   Total Bilirubin 1.6 (H) 0.3 - 1.2 mg/dL   GFR calc non Af Amer >60 >60 mL/min   GFR calc Af Amer >60 >60 mL/min    Comment: (NOTE) The eGFR has been calculated using the CKD EPI equation. This calculation has not been validated in all clinical situations. eGFR's persistently <60 mL/min signify possible Chronic Kidney Disease.    Anion gap 5 5 - 15  Culture, blood (routine x 2)     Status: None (Preliminary result)   Collection Time: 10/04/14  3:47 AM  Result Value Ref Range   Specimen Description BLOOD LEFT HAND    Special Requests BOTTLES DRAWN AEROBIC AND ANAEROBIC 4CC    Culture NO GROWTH < 12 HOURS    Report Status PENDING   CBC with Differential     Status: Abnormal   Collection Time: 10/04/14  3:47 AM  Result Value Ref Range    WBC 5.6 3.6 - 11.0 K/uL   RBC 3.40 (L) 3.80 - 5.20 MIL/uL   Hemoglobin 10.4 (L) 12.0 - 16.0 g/dL   HCT 31.1 (L) 35.0 - 47.0 %   MCV 91.4 80.0 - 100.0 fL   MCH 30.7 26.0 - 34.0 pg   MCHC 33.6 32.0 - 36.0 g/dL   RDW 17.3 (H) 11.5 - 14.5 %   Platelets 258 150 - 440 K/uL   Neutrophils Relative % 87 %   Neutro Abs 4.8 1.4 - 6.5 K/uL   Lymphocytes Relative 5 %   Lymphs Abs 0.3 (L) 1.0 - 3.6 K/uL   Monocytes Relative 7 %   Monocytes Absolute 0.4 0.2 - 0.9 K/uL   Eosinophils Relative 1 %   Eosinophils Absolute 0.1 0 - 0.7 K/uL   Basophils Relative 0 %   Basophils Absolute 0.0 0 - 0.1 K/uL  Lactic acid, plasma     Status: None   Collection Time: 10/04/14  3:47 AM  Result Value Ref Range   Lactic Acid, Venous 1.2 0.5 - 2.0 mmol/L  Urinalysis complete, with microscopic ([facility] only)     Status: Abnormal   Collection Time: 10/04/14  3:47 AM  Result Value Ref Range   Color, Urine AMBER (A) YELLOW   APPearance HAZY (A) CLEAR   Glucose, UA NEGATIVE NEGATIVE mg/dL   Bilirubin Urine NEGATIVE NEGATIVE   Ketones, ur NEGATIVE NEGATIVE mg/dL   Specific Gravity, Urine 1.014 1.005 - 1.030   Hgb urine dipstick NEGATIVE NEGATIVE   pH 5.0 5.0 - 8.0   Protein, ur NEGATIVE NEGATIVE mg/dL   Nitrite NEGATIVE NEGATIVE   Leukocytes, UA NEGATIVE NEGATIVE   RBC / HPF 0-5 0 - 5 RBC/hpf   WBC, UA 0-5 0 - 5 WBC/hpf   Bacteria, UA RARE (A) NONE SEEN   Squamous Epithelial / LPF 6-30 (A) NONE SEEN   Mucous PRESENT   Culture, blood (routine x 2)     Status: None (Preliminary result)   Collection Time: 10/04/14  3:50 AM  Result Value Ref Range   Specimen Description BLOOD LEFT ASSIST CONTROL    Special Requests BOTTLES DRAWN AEROBIC AND ANAEROBIC 5CC    Culture NO GROWTH < 12 HOURS    Report Status PENDING   Lactic acid, plasma     Status: None   Collection Time: 10/04/14  7:29 AM  Result Value Ref Range   Lactic Acid, Venous 0.9 0.5 - 2.0 mmol/L   Ct Abdomen Pelvis W Contrast  10/04/2014   CLINICAL  DATA:  Postoperative fever recent cholecystectomy with effusions. Biliary drain was necessitated.  EXAM: CT ABDOMEN AND PELVIS WITH CONTRAST  TECHNIQUE: Multidetector CT imaging of the abdomen and pelvis was performed using the standard protocol following bolus administration of intravenous contrast.  CONTRAST:  166m OMNIPAQUE IOHEXOL 300 MG/ML  SOLN  COMPARISON:  CT 09/04/2014  FINDINGS: Lower chest: There is LEFT lower lobe atelectasis and effusion.  Hepatobiliary: Moderate volume of low-density free fluid surrounding the liver extending in to the pelvis along the pericolic gutters. Similar fluid surrounding the spleen and upper abdomen. The volume of fluid is slightly decreased from comparison exam of 09/04/2014. There is no biliary duct dilatation. Cholecystectomy clips in the gallbladder fossa. Common bile duct is mildly dilated 8 mm similar prior.  Pancreas: Pancreas is normal. No ductal dilatation. No pancreatic inflammation.  Spleen: Normal spleen  Adrenals/urinary tract: Adrenal glands and kidneys are normal. The ureters and bladder normal.  Stomach/Bowel: Stomach, small bowel, appendix, and cecum are normal. The colon and rectosigmoid colon are normal.  Vascular/Lymphatic: Abdominal aorta is normal caliber. There is no retroperitoneal or periportal lymphadenopathy. No pelvic lymphadenopathy.  Reproductive: Post hysterectomy.  Musculoskeletal: No aggressive osseous lesion.  Other: There is a new rectus sheath hematoma along the inferior aspect of the LEFT rectus muscle measuring 6.7 x 4.1 cm on image 81, series 2. There is a linear metallic density the structure through the rectus muscle at this level from which extends approximately 10 cm along the abdominal wall within the muscle consistent with a endovascular coil.  There is a new retroperitoneal hematoma in the RIGHT groin which displaces the bladder and measures 10.6 x 4.5 cm by 7.4 cm on image 86, series 2.  IMPRESSION: 1. Large volume of  intraperitoneal free fluid is slightly decreased from comparison exam. Etiology includes bile leak or ascites from cirrhosis. If concern for bile leak consider nuclear medicine HIDA scan or sampling of the ascitic fluid. 2. No evidence of biloma in the gallbladder fossa. 3. LEFT rectus sheath hematoma with vascular coil. No comparison available. 4. RIGHT retroperitoneal groin hematoma.  New from 09/04/2014. Findings conveyed toDr. JMicheline Chapman10/01/2014  at09:12.   Electronically Signed   By: SSuzy BouchardM.D.   On: 10/04/2014 09:12   Dg Chest Portable 1 View  10/04/2014   CLINICAL DATA:  Fever and shortness of breath  EXAM: PORTABLE CHEST 1 VIEW  COMPARISON:  Yesterday  FINDINGS: Small left pleural effusion. The retrocardiac lung remains poorly evaluated due to patient size and hypoventilation; no definitive pneumonia. No edema or air leak. Mild cardiomegaly, accentuated by technique.  IMPRESSION: 1. No definitive pneumonia, but significantly limited in the retrocardiac lung due to patient factors. 2. Small left pleural effusion.   Electronically Signed   By: JMonte FantasiaM.D.   On: 10/04/2014 04:21   Dg Chest Portable 1 View  10/03/2014   CLINICAL DATA:  Chest pain beginning yesterday. Shortness of breath. Nausea. Asthma.  EXAM: PORTABLE CHEST 1 VIEW  COMPARISON:  08/27/2014  FINDINGS: Low lung volumes are seen. Opacity is seen in the left retrocardiac lung base with silhouetting of left hemidiaphragm, which may be due to atelectasis or consolidation. Right lung remains clear. Heart size within normal limits.  IMPRESSION: Low lung volumes with left retrocardiac atelectasis versus consolidation.   Electronically Signed   By: JEarle GellM.D.   On: 10/03/2014 07:42   Dg C-arm 1-60 Min  10/03/2014   CLINICAL DATA:  Fluoroscopy was utilized by  the requesting physician. No radiographic interpretation.  EXAM: DG C-ARM 61-120 MIN  COMPARISON:  None.   Electronically Signed   By: Porfirio Mylar   On:  10/03/2014 14:26    Review of Systems  Constitutional: Positive for fever and chills.  HENT: Negative for hearing loss.   Eyes: Negative for blurred vision.  Respiratory: Negative for cough and shortness of breath.   Cardiovascular: Negative for chest pain.  Gastrointestinal: Positive for abdominal pain.  Genitourinary: Negative for dysuria.  Musculoskeletal: Positive for joint pain.  Skin: Negative for rash.  Neurological: Negative for focal weakness.  Endo/Heme/Allergies: Does not bruise/bleed easily.    Blood pressure 105/74, pulse 82, temperature 97.6 F (36.4 C), temperature source Oral, resp. rate 21, height 5' 2"  (1.575 m), weight 113.399 kg (250 lb), SpO2 92 %. Physical Exam  Constitutional: She is oriented to person, place, and time. She appears well-developed and well-nourished.  Obese female in obvious pain  HENT:  Head: Normocephalic and atraumatic.  Mouth/Throat: Oropharynx is clear and moist. No oropharyngeal exudate.  Eyes: EOM are normal. Pupils are equal, round, and reactive to light. No scleral icterus.  Neck: Normal range of motion. Neck supple. No JVD present. No tracheal deviation present. No thyromegaly present.  Cardiovascular: Normal rate and regular rhythm.   No murmur heard. Respiratory:  Clear to ascultation No dullness to percussion No use of accessary muscles  GI: She exhibits no mass.  Distended with diffuse tenderness. Mild rebound but no guarding. Bowel sounds are positive.  Musculoskeletal: Normal range of motion. She exhibits edema.  Lymphadenopathy:    She has no cervical adenopathy.  Neurological: She is alert and oriented to person, place, and time. She has normal reflexes. No cranial nerve deficit.  Skin: Skin is warm. No rash noted. No erythema.  Psychiatric: She has a normal mood and affect.     Assessment/Plan 1. Sepsis: Patient tachycardic and febrile. Will give supportive care. With ascites have to be careful with aggressive  fluids. Abx for underlying cause. Suspected cause is bacterial paratenitis. Will treat with abx. Will get diagnostic paracentisis and culture fluid.  2. Abd Pain: Secondary to ascitis and suspected paratenitis. IV pain meds PRN. Since recently had biliary stent removed will consult GI.  3. Ascites: CT scan shows less fluid than previous. Will continue home lasix and spironolactone.  4. Hypokalemia: Mild will monitor for now since she is on spironolactone which can raise potassium.  Case discussed with Dr Jimmye Norman and Dr Allen Norris. Reviewed past medical records.  Time spent= 45 min    Baxter Hire 10/04/2014, 11:53 AM

## 2014-10-04 NOTE — ED Provider Notes (Signed)
IMPRESSION: 1. Large volume of intraperitoneal free fluid is slightly decreased from comparison exam. Etiology includes bile leak or ascites from cirrhosis. If concern for bile leak consider nuclear medicine HIDA scan or sampling of the ascitic fluid. 2. No evidence of biloma in the gallbladder fossa. 3. LEFT rectus sheath hematoma with vascular coil. No comparison available. 4. RIGHT retroperitoneal groin hematoma. New from 09/04/2014. Findings conveyed toDr. Lenise Arena 10/04/2014 at09:12.  Patient remains tender on examination, presented earlier with SIRS type symptoms and vital signs. 101.5 temperature earlier. Currently afebrile, cultures and IV antibiotics will be given, ultrasound-guided paracentesis will be ordered. I discussed with the hospitalist for admission.  Earleen Newport, MD 10/04/14 [phone]

## 2014-10-04 NOTE — ED Notes (Signed)
Pt presents w/ post-operative fever. Pt had recent cholecystectomy w/ adhesions. Pt's bile duct was compromised during surgery and she had to have a drain placed in her bile duct. Pt has additional complication of cirrhotic liver and ascites. Pt had drain from bile duct removed x 1 day ago.

## 2014-10-04 NOTE — ED Provider Notes (Signed)
[facility] Emergency Department Provider Note  ____________________________________________  Time seen: Approximately 346 AM  I have reviewed the triage vital signs and the nursing notes.   HISTORY  Chief Complaint Fever and Post-op Problem    HPI Lori Mckenzie is a 49 y.o. female who woke up this morning with chills and a fever. She reports that her son called EMS and got some old rags to help cool her down. The patient did not take any medication. She reports that she was achy all day but otherwise had been doing fine. The patient had a gallbladder stent removed yesterday and was seen here in the emergency department. She reports that she was put to sleep for the procedure but when she awoke she was fine. The patient reports she did some pain medicine yesterday but has continued to have some abdominal pain with left lower quadrant pain and right-sided pain. Had some shortness of breath with some intermittent chest pain and mild cough. The patient has been nauseous with no vomiting and reports she has a headache.She rates her pain as an 8 out of 10 in intensity. The patient though is very sleepy during the history taking and exam.   Past Medical History  Diagnosis Date  . Brittle bone disease   . Asthma   . Thyroid disease   . Hypertension   . Back pain   . Diabetes mellitus without complication ([place])   . Chronic kidney disease   . Anxiety   . GERD (gastroesophageal reflux disease)   . Sleep apnea   . COPD (chronic obstructive pulmonary disease) ([place])   . Hypothyroidism   . Cervical disc disease   . TIA (transient ischemic attack)   . Cancer ([place])     Uteriine  ca 80yr ago partial hysterectomy  . Collagen vascular disease ([place]     RA  3-4 yrs ago  . Ascites   . NASH (nonalcoholic steatohepatitis)     Patient Active Problem List   Diagnosis Date Noted  . Ascites 09/05/2014  . NASH (nonalcoholic steatohepatitis) 09/05/2014  . Hypokalemia  09/05/2014  . Abdominal abscess 08/25/2014  . Dysthymia 08/05/2014  . Other social stressor 08/05/2014  . Abscess of abdominal cavity 08/03/2014  . Bile leak, postoperative 07/17/2014  . Steatohepatitis 07/15/2014  . Cholecystitis   . Biliary colic 027/03/5007 . Abdominal pain   . Type 2 diabetes mellitus 07/12/2014  . COPD (chronic obstructive pulmonary disease) 07/12/2014  . HTN (hypertension) 07/12/2014  . GERD (gastroesophageal reflux disease) 07/12/2014  . OSA on CPAP 07/12/2014  . Anxiety 07/12/2014    Past Surgical History  Procedure Laterality Date  . Abdominal hysterectomy    . Tubal ligation    . Colonoscopy with propofol N/A 06/23/2014    Procedure: COLONOSCOPY WITH PROPOFOL;  Surgeon: MLollie Sails MD;  [facility];  Service: Endoscopy;  Laterality: N/A;  . Esophagogastroduodenoscopy N/A 06/23/2014    Procedure: ESOPHAGOGASTRODUODENOSCOPY (EGD);  Surgeon: MLollie Sails MD;  [facility];  Service: Endoscopy;  Laterality: N/A;  . Cholecystectomy N/A 07/15/2014    Procedure: LAPAROSCOPIC CHOLECYSTECTOMY with liver biopsy ;  Surgeon: MSherri Rad MD;  Location: [facility] ORS;  Service: General;  Laterality: N/A;  . Ercp N/A 07/16/2014    Procedure: ENDOSCOPIC RETROGRADE CHOLANGIOPANCREATOGRAPHY (ERCP);  Surgeon: MClarene Essex MD;  [facility];  Service: Endoscopy;  Laterality: N/A;  . Wisdom tooth extraction      Current Outpatient Rx  Name  Route  Sig  Dispense  Refill  . albuterol (PROVENTIL HFA;VENTOLIN HFA) 108 (90 BASE) MCG/ACT inhaler   Inhalation   Inhale 2 puffs into the lungs every 4 (four) hours as needed for wheezing or shortness of breath.         . ALPRAZolam (XANAX) 1 MG tablet   Oral   Take 1 tablet (1 mg total) by mouth 3 (three) times daily as needed for anxiety. Patient taking differently: Take 2 mg by mouth 3 (three) times daily.    30 tablet   0   . citalopram (CELEXA) 20 MG tablet   Oral   Take 20 mg by  mouth at bedtime.          . clotrimazole (MYCELEX) 10 MG troche   Oral   Take 10 mg by mouth 4 (four) times daily as needed (for dry mouth).          . fluticasone (FLONASE) 50 MCG/ACT nasal spray   Each Nare   Place 1 spray into both nostrils daily as needed for rhinitis.          . furosemide (LASIX) 40 MG tablet   Oral   Take 80 mg by mouth 2 (two) times daily.          Marland Kitchen gabapentin (NEURONTIN) 300 MG capsule   Oral   Take 600 mg by mouth 3 (three) times daily.          Marland Kitchen ipratropium (ATROVENT HFA) 17 MCG/ACT inhaler   Inhalation   Inhale 2 puffs into the lungs every 8 (eight) hours.         Marland Kitchen lactulose (CEPHULAC) 20 G packet   Oral   Take 1 packet by mouth 2 (two) times daily.         . metFORMIN (GLUCOPHAGE) 1000 MG tablet   Oral   Take 500-1,000 mg by mouth 2 (two) times daily as needed (for high blood sugar).         . nystatin (MYCOSTATIN) powder   Topical   Apply 1 application topically 3 (three) times daily.         Marland Kitchen omeprazole (PRILOSEC) 20 MG capsule   Oral   Take 40 mg by mouth 2 (two) times daily as needed (for acid reflux).          . Oxycodone HCl 10 MG TABS   Oral   Take 0.5 tablets (5 mg total) by mouth every 4 (four) hours as needed for severe pain.   20 tablet   0   . promethazine (PHENERGAN) 12.5 MG tablet   Oral   Take 12.5 mg by mouth every 6 (six) hours.         . rizatriptan (MAXALT) 10 MG tablet   Oral   Take 10 mg by mouth as needed for migraine.          Marland Kitchen spironolactone (ALDACTONE) 100 MG tablet   Oral   Take 1 tablet by mouth daily.         . cefdinir (OMNICEF) 300 MG capsule   Oral   Take 300 mg by mouth 2 (two) times daily.         . famotidine (PEPCID) 20 MG tablet   Oral   Take 20 mg by mouth 2 (two) times daily.           Allergies Tape and Vicodin  Family History  Problem Relation Age of Onset  . Lung cancer Father   . Ulcers Father   .  Heart disease Sister   . Ulcers Sister    . Heart disease Brother     Social History Social History  Substance Use Topics  . Smoking status: Never Smoker   . Smokeless tobacco: Never Used  . Alcohol Use: No     Comment: occ    Review of Systems Constitutional:  fever/chills Eyes: No visual changes. ENT: No sore throat. Cardiovascular: chest pain. Respiratory:  shortness of breath. Gastrointestinal:  abdominal pain.   nausea, no vomiting.  No diarrhea.  No constipation. Genitourinary: Negative for dysuria. Musculoskeletal: Negative for back pain. Skin: Negative for rash. Neurological:  Headaches with no focal weakness or numbness.  10-point ROS otherwise negative.  ____________________________________________   PHYSICAL EXAM:  VITAL SIGNS: ED Triage Vitals  Enc Vitals Group     BP 10/04/14 0331 105/74 mmHg     Pulse Rate 10/04/14 0331 122     Resp 10/04/14 0331 32     Temp 10/04/14 0331 101.5 F (38.6 C)     Temp Source 10/04/14 0331 Oral     SpO2 10/04/14 0324 94 %     Weight 10/04/14 0331 250 lb (113.399 kg)     Height 10/04/14 0331 5' 2"  (1.575 m)     Head Cir --      Peak Flow --      Pain Score 10/04/14 0332 8     Pain Loc --      Pain Edu? --      Excl. in [place]? --     Constitutional: Somnolent but oriented. Mild to moderate distress Eyes: Conjunctivae are normal. PERRL. EOMI. Head: Atraumatic. Nose: No congestion/rhinnorhea. Mouth/Throat: Mucous membranes are moist.  Oropharynx non-erythematous. Cardiovascular: Normal rate, regular rhythm. Grossly normal heart sounds.  Good peripheral circulation. Respiratory: Normal respiratory effort.  No retractions. Lungs CTAB. Gastrointestinal: Soft with right sided and left lower quadrant tenderness to palpation No distention. Active bowel sounds Musculoskeletal: No lower extremity tenderness nor edema.  Neurologic:  Normal speech and language.  Skin:  Skin is warm, dry and intact.  Psychiatric: Patient  somnolent  ____________________________________________   LABS (all labs ordered are listed, but only abnormal results are displayed)  Labs Reviewed  COMPREHENSIVE METABOLIC PANEL - Abnormal; Notable for the following:    Potassium 3.2 (*)    Glucose, Bld 132 (*)    Calcium 8.6 (*)    Albumin 3.2 (*)    ALT 12 (*)    Total Bilirubin 1.6 (*)    All other components within normal limits  CBC WITH DIFFERENTIAL/PLATELET - Abnormal; Notable for the following:    RBC 3.40 (*)    Hemoglobin 10.4 (*)    HCT 31.1 (*)    RDW 17.3 (*)    Lymphs Abs 0.3 (*)    All other components within normal limits  URINALYSIS COMPLETEWITH MICROSCOPIC ([facility] ONLY) - Abnormal; Notable for the following:    Color, Urine AMBER (*)    APPearance HAZY (*)    Bacteria, UA RARE (*)    Squamous Epithelial / LPF 6-30 (*)    All other components within normal limits  CULTURE, BLOOD (ROUTINE X 2)  CULTURE, BLOOD (ROUTINE X 2)  URINE CULTURE  LACTIC ACID, PLASMA  LACTIC ACID, PLASMA   ____________________________________________  EKG  None ____________________________________________  RADIOLOGY  Chest x-ray: No definitive pneumonia but significantly limited in the retrocardiac lung due to patient factors, small left pleural effusion ____________________________________________   PROCEDURES  Procedure(s) performed: None  Critical Care performed:  No  ____________________________________________   INITIAL IMPRESSION / ASSESSMENT AND PLAN / ED COURSE  Pertinent labs & imaging results that were available during my care of the patient were reviewed by me and considered in my medical decision making (see chart for details).  This is a 49 year old female who was here after having a procedure to remove her bile stent yesterday. The patient's blood work at this time is unremarkable but I will do a CT scan to evaluate for possible re-collection of fluid or other infection. The patient is having some  left lower quadrant as well as right sided pain which is her baseline but the left abdominal pain is new. The patient's care was signed out to Dr. Lenise Arena will follow-up the results of the CT scan and reassess the patient to determine the patient's disposition. ____________________________________________   FINAL CLINICAL IMPRESSION(S) / ED DIAGNOSES  Final diagnoses:  Fever, unspecified fever cause  Generalized abdominal pain      Loney Hering, MD 10/04/14 [phone]

## 2014-10-05 DIAGNOSIS — R188 Other ascites: Secondary | ICD-10-CM

## 2014-10-05 DIAGNOSIS — K652 Spontaneous bacterial peritonitis: Secondary | ICD-10-CM

## 2014-10-05 LAB — CBC
HCT: 28 % — ABNORMAL LOW (ref 35.0–47.0)
Hemoglobin: 9.5 g/dL — ABNORMAL LOW (ref 12.0–16.0)
MCH: 31.2 pg (ref 26.0–34.0)
MCHC: 33.9 g/dL (ref 32.0–36.0)
MCV: 92.3 fL (ref 80.0–100.0)
Platelets: 214 10*3/uL (ref 150–440)
RBC: 3.03 MIL/uL — ABNORMAL LOW (ref 3.80–5.20)
RDW: 17.7 % — ABNORMAL HIGH (ref 11.5–14.5)
WBC: 3.3 10*3/uL — ABNORMAL LOW (ref 3.6–11.0)

## 2014-10-05 LAB — DIFFERENTIAL
Basophils Absolute: 0 10*3/uL (ref 0–0.1)
Basophils Relative: 1 %
Eosinophils Absolute: 0.1 10*3/uL (ref 0–0.7)
Eosinophils Relative: 5 %
Lymphocytes Relative: 14 %
Lymphs Abs: 0.5 10*3/uL — ABNORMAL LOW (ref 1.0–3.6)
Monocytes Absolute: 0.2 10*3/uL (ref 0.2–0.9)
Monocytes Relative: 7 %
Neutro Abs: 2.4 10*3/uL (ref 1.4–6.5)
Neutrophils Relative %: 73 %

## 2014-10-05 LAB — BASIC METABOLIC PANEL
Anion gap: 6 (ref 5–15)
BUN: 7 mg/dL (ref 6–20)
CO2: 26 mmol/L (ref 22–32)
Calcium: 7.8 mg/dL — ABNORMAL LOW (ref 8.9–10.3)
Chloride: 109 mmol/L (ref 101–111)
Creatinine, Ser: 0.73 mg/dL (ref 0.44–1.00)
GFR calc Af Amer: >60 mL/min (ref >60)
GFR calc non Af Amer: >60 mL/min (ref >60)
Glucose, Bld: 84 mg/dL (ref 65–99)
Potassium: 3 mmol/L — ABNORMAL LOW (ref 3.5–5.1)
Sodium: 141 mmol/L (ref 135–145)

## 2014-10-05 LAB — GLUCOSE, CAPILLARY
Glucose-Capillary: 81 mg/dL (ref 65–99)
Glucose-Capillary: 96 mg/dL (ref 65–99)
Glucose-Capillary: 96 mg/dL (ref 65–99)
Glucose-Capillary: 85 mg/dL (ref 65–99)

## 2014-10-05 LAB — MAGNESIUM: Magnesium: 1.9 mg/dL (ref 1.7–2.4)

## 2014-10-05 MED ORDER — IPRATROPIUM-ALBUTEROL 0.5-2.5 (3) MG/3ML IN SOLN
3.0000 mL | Freq: Once | RESPIRATORY_TRACT | Status: DC
  Filled 2014-10-05: qty 3

## 2014-10-05 NOTE — Progress Notes (Signed)
[facility] [place] NAME: Lori Mckenzie    MR#:  [id number]  DATE OF BIRTH:  [birth year]  SUBJECTIVE:  CHIEF COMPLAINT:   Chief Complaint  Patient presents with  . Fever  . Post-op Problem   patient is 49 year old Caucasian female with past medical history History of NASH with associated ascites, recent cholecystectomy with subsequent biliary stent placement , who comes to the hospital with fevers as high as 101 and tachycardia. Patient underwent paracentesis which revealed nucleated cell count of 771, she was initiated on antibiotic therapy and admitted to the hospital for further evaluation and treatment. She complains of abdominal pain, diffuse, but feels that her abdominal pain has been subsiding. She would like to leave the hospital by Tuesday for disability hearing. She complains of liquidy stool and stool incontinence. She would like to get more pain medications.   Review of Systems  Constitutional: Negative for fever, chills and weight loss.  HENT: Negative for congestion.   Eyes: Negative for blurred vision and double vision.  Respiratory: Negative for cough, sputum production, shortness of breath and wheezing.   Cardiovascular: Negative for chest pain, palpitations, orthopnea, leg swelling and PND.  Gastrointestinal: Positive for abdominal pain and diarrhea. Negative for nausea, vomiting, constipation and blood in stool.  Genitourinary: Negative for dysuria, urgency, frequency and hematuria.  Musculoskeletal: Negative for falls.  Neurological: Negative for dizziness, tremors, focal weakness and headaches.  Endo/Heme/Allergies: Does not bruise/bleed easily.  Psychiatric/Behavioral: Negative for depression. The patient does not have insomnia.     VITAL SIGNS: Blood pressure 90/62, pulse 81, temperature 98 F (36.7 C), temperature source Oral, resp. rate 16, height 5' 2"  (1.575 m), weight 113.399 kg (250 lb), SpO2 96 %.  PHYSICAL  EXAMINATION:   GENERAL:  49 y.o.-year-old patient lying in the bed with no acute distress.  EYES: Pupils equal, round, reactive to light and accommodation. No scleral icterus. Extraocular muscles intact.  HEENT: Head atraumatic, normocephalic. Oropharynx and nasopharynx clear.  NECK:  Supple, no jugular venous distention. No thyroid enlargement, no tenderness.  LUNGS: Normal breath sounds bilaterally, no wheezing, rales,rhonchi or crepitation. No use of accessory muscles of respiration.  CARDIOVASCULAR: S1, S2 normal. No murmurs, rubs, or gallops.  ABDOMEN: Soft, mild discomfort diffusely on palpation but no rebound or guarding was noted, most discomfort is in the right upper quadrant. Some voluntary guarding, nondistended. Bowel sounds present, though diminished. No organomegaly or mass, although difficult to evaluate due to voluntary guarding.  EXTREMITIES: No pedal edema, cyanosis, or clubbing.  NEUROLOGIC: Cranial nerves II through XII are intact. Muscle strength 5/5 in all extremities. Sensation intact. Gait not checked.  PSYCHIATRIC: The patient is alert and oriented x 3. Sad affect SKIN: No obvious rash, lesion, or ulcer.   ORDERS/RESULTS REVIEWED:   CBC  Recent Labs Lab 10/03/14 0655 10/04/14 0347 10/05/14 0416  WBC 3.8 5.6 3.3*  HGB 11.2* 10.4* 9.5*  HCT 34.5* 31.1* 28.0*  PLT 244 258 214  MCV 92.1 91.4 92.3  MCH 30.0 30.7 31.2  MCHC 32.5 33.6 33.9  RDW 17.7* 17.3* 17.7*  LYMPHSABS  --  0.3*  --   MONOABS  --  0.4  --   EOSABS  --  0.1  --   BASOSABS  --  0.0  --    ------------------------------------------------------------------------------------------------------------------  Chemistries   Recent Labs Lab 10/03/14 0655 10/04/14 0347 10/05/14 0416  NA 140 138 141  K 3.7 3.2* 3.0*  CL 104 106  109  CO2 27 27 26   GLUCOSE 101* 132* 84  BUN 8 8 7   CREATININE 0.45 0.62 0.73  CALCIUM 9.7 8.6* 7.8*  AST 30 21  --   ALT 10* 12*  --   ALKPHOS 64 54  --    BILITOT 1.5* 1.6*  --    ------------------------------------------------------------------------------------------------------------------ estimated creatinine clearance is 99 mL/min (by C-G formula based on Cr of 0.73). ------------------------------------------------------------------------------------------------------------------ No results for input(s): TSH, T4TOTAL, T3FREE, THYROIDAB in the last 72 hours.  Invalid input(s): FREET3  Cardiac Enzymes  Recent Labs Lab 10/03/14 0655  TROPONINI <0.03   ------------------------------------------------------------------------------------------------------------------ Invalid input(s): POCBNP ---------------------------------------------------------------------------------------------------------------  RADIOLOGY: Ct Abdomen Pelvis W Contrast  10/04/2014   CLINICAL DATA:  Postoperative fever recent cholecystectomy with effusions. Biliary drain was necessitated.  EXAM: CT ABDOMEN AND PELVIS WITH CONTRAST  TECHNIQUE: Multidetector CT imaging of the abdomen and pelvis was performed using the standard protocol following bolus administration of intravenous contrast.  CONTRAST:  167m OMNIPAQUE IOHEXOL 300 MG/ML  SOLN  COMPARISON:  CT 09/04/2014  FINDINGS: Lower chest: There is LEFT lower lobe atelectasis and effusion.  Hepatobiliary: Moderate volume of low-density free fluid surrounding the liver extending in to the pelvis along the pericolic gutters. Similar fluid surrounding the spleen and upper abdomen. The volume of fluid is slightly decreased from comparison exam of 09/04/2014. There is no biliary duct dilatation. Cholecystectomy clips in the gallbladder fossa. Common bile duct is mildly dilated 8 mm similar prior.  Pancreas: Pancreas is normal. No ductal dilatation. No pancreatic inflammation.  Spleen: Normal spleen  Adrenals/urinary tract: Adrenal glands and kidneys are normal. The ureters and bladder normal.  Stomach/Bowel: Stomach, small  bowel, appendix, and cecum are normal. The colon and rectosigmoid colon are normal.  Vascular/Lymphatic: Abdominal aorta is normal caliber. There is no retroperitoneal or periportal lymphadenopathy. No pelvic lymphadenopathy.  Reproductive: Post hysterectomy.  Musculoskeletal: No aggressive osseous lesion.  Other: There is a new rectus sheath hematoma along the inferior aspect of the LEFT rectus muscle measuring 6.7 x 4.1 cm on image 81, series 2. There is a linear metallic density the structure through the rectus muscle at this level from which extends approximately 10 cm along the abdominal wall within the muscle consistent with a endovascular coil.  There is a new retroperitoneal hematoma in the RIGHT groin which displaces the bladder and measures 10.6 x 4.5 cm by 7.4 cm on image 86, series 2.  IMPRESSION: 1. Large volume of intraperitoneal free fluid is slightly decreased from comparison exam. Etiology includes bile leak or ascites from cirrhosis. If concern for bile leak consider nuclear medicine HIDA scan or sampling of the ascitic fluid. 2. No evidence of biloma in the gallbladder fossa. 3. LEFT rectus sheath hematoma with vascular coil. No comparison available. 4. RIGHT retroperitoneal groin hematoma.  New from 09/04/2014. Findings conveyed toDr. JMicheline Chapman10/01/2014  at09:12.   Electronically Signed   By: SSuzy BouchardM.D.   On: 10/04/2014 09:12   Dg Chest Portable 1 View  10/04/2014   CLINICAL DATA:  Fever and shortness of breath  EXAM: PORTABLE CHEST 1 VIEW  COMPARISON:  Yesterday  FINDINGS: Small left pleural effusion. The retrocardiac lung remains poorly evaluated due to patient size and hypoventilation; no definitive pneumonia. No edema or air leak. Mild cardiomegaly, accentuated by technique.  IMPRESSION: 1. No definitive pneumonia, but significantly limited in the retrocardiac lung due to patient factors. 2. Small left pleural effusion.   Electronically Signed   By: JMonte Fantasia  M.D.   On: 10/04/2014 04:21   Dg C-arm 1-60 Min  10/03/2014   CLINICAL DATA:  Fluoroscopy was utilized by the requesting physician. No radiographic interpretation.  EXAM: DG C-ARM 61-120 MIN  COMPARISON:  None.   Electronically Signed   By: Porfirio Mylar   On: 10/03/2014 14:26    EKG:  Orders placed or performed during the hospital encounter of 10/03/14  . ED EKG  . ED EKG    ASSESSMENT AND PLAN:  Active Problems:   Sepsis ([place])   SBP (spontaneous bacterial peritonitis) ([place]) 1. Sepsis due to bacterial peritonitis, blood cultures are negative so far. Urine culture is pending, as well as peritoneal fluid culture, continue Zosyn for now. Clinically improving 2. Spontaneous bacterial peritonitis. Continue Zosyn, cultures are pending as above. Gastroenterology consultation was requested, pending 3. Hypokalemia. Supplement IV, check magnesium level 4. Leukopenia, unclear etiology, get differential done to rule out low absolute neutrophil count 5. History of biloma,  getting gastroenterologist involved for recommendations, CT scan of abdomen and pelvis revealed intraperitoneal fluid, which was decreased from prior study and no evidence of biloma.    Management plans discussed with the patient, family and they are in agreement.   DRUG ALLERGIES:  Allergies  Allergen Reactions  . Tape Swelling  . Vicodin [Hydrocodone-Acetaminophen] Itching and Rash    CODE STATUS:     Code Status Orders        Start     Ordered   10/04/14 1300  Full code   Continuous     10/04/14 1259    Advance Directive Documentation        Most Recent Value   Type of Advance Directive  Healthcare Power of Attorney, Living will   Pre-existing out of facility DNR order (yellow form or pink MOST form)     "MOST" Form in Place?        TOTAL TIME TAKING CARE OF THIS PATIENT: 45 minutes.    Theodoro Grist M.D on 10/05/2014 at 11:24 AM  Between 7am to 6pm - Pager - [phone]  After 6pm go to  [email or web address] - password [email or web address] [facility]  Office  [phone]  CC: Primary care physician; Ashkin, Neldon Labella, MD

## 2014-10-05 NOTE — Consult Note (Signed)
[facility]  [address] [place], [place] [address] Phone: [phone] Fax : [phone]  Consultation  Referring Provider:     No ref. provider found Primary Care Physician:  Ardine Eng, MD Primary Gastroenterologist:  Dr. Gustavo Lah         Reason for Consultation:     Possible SBP   Date of Admission:  10/04/2014 Date of Consultation:  10/05/2014         HPI:   Lori Mckenzie is a 49 y.o. female who recently had an ERCP for stent removal. The patient had been in the emergency room earlier that morning for feeling tired and weak with a history of fatty liver disease with cirrhosis. The patient was sent home after the ERCP and continued to have increased abdominal pain with distention. The patient had her gallbladder removed a few weeks ago with a bile duct leak. She also reports that she's been having some fevers and chills. The temperature in the emergency room was 101.5. She denies any black stools or bloody stools. This morning the patient is asking if she can go home.  Past Medical History  Diagnosis Date  . Brittle bone disease   . Asthma   . Thyroid disease   . Hypertension   . Back pain   . Diabetes mellitus without complication ([place])   . Chronic kidney disease   . Anxiety   . GERD (gastroesophageal reflux disease)   . Sleep apnea   . COPD (chronic obstructive pulmonary disease) ([place])   . Hypothyroidism   . Cervical disc disease   . TIA (transient ischemic attack)   . Cancer ([place])     Uteriine  ca 27yr ago partial hysterectomy  . Collagen vascular disease ([place]     RA  3-4 yrs ago  . Ascites   . NASH (nonalcoholic steatohepatitis)     Past Surgical History  Procedure Laterality Date  . Abdominal hysterectomy    . Tubal ligation    . Colonoscopy with propofol N/A 06/23/2014    Procedure: COLONOSCOPY WITH PROPOFOL;  Surgeon: MLollie Sails MD;  [facility];  Service: Endoscopy;  Laterality: N/A;  .  Esophagogastroduodenoscopy N/A 06/23/2014    Procedure: ESOPHAGOGASTRODUODENOSCOPY (EGD);  Surgeon: MLollie Sails MD;  [facility];  Service: Endoscopy;  Laterality: N/A;  . Cholecystectomy N/A 07/15/2014    Procedure: LAPAROSCOPIC CHOLECYSTECTOMY with liver biopsy ;  Surgeon: MSherri Rad MD;  Location: [facility] ORS;  Service: General;  Laterality: N/A;  . Ercp N/A 07/16/2014    Procedure: ENDOSCOPIC RETROGRADE CHOLANGIOPANCREATOGRAPHY (ERCP);  Surgeon: MClarene Essex MD;  [facility];  Service: Endoscopy;  Laterality: N/A;  . Wisdom tooth extraction      Prior to Admission medications   Medication Sig Start Date End Date Taking? Authorizing Provider  albuterol (PROVENTIL HFA;VENTOLIN HFA) 108 (90 BASE) MCG/ACT inhaler Inhale 2 puffs into the lungs every 4 (four) hours as needed for wheezing or shortness of breath.   Yes Historical Provider, MD  ALPRAZolam (Duanne Moron 1 MG tablet Take 1 tablet (1 mg total) by mouth 3 (three) times daily as needed for anxiety. Patient taking differently: Take 2 mg by mouth 3 (three) times daily.  08/06/14  Yes MSherri Rad MD  citalopram (CELEXA) 20 MG tablet Take 20 mg by mouth at bedtime.    Yes Historical Provider, MD  clotrimazole (MYCELEX) 10 MG troche Take 10 mg by mouth 4 (four) times daily as needed (for dry mouth).  Yes Historical Provider, MD  fluticasone (FLONASE) 50 MCG/ACT nasal spray Place 1 spray into both nostrils daily as needed for rhinitis.    Yes Historical Provider, MD  furosemide (LASIX) 40 MG tablet Take 80 mg by mouth 2 (two) times daily.    Yes Historical Provider, MD  gabapentin (NEURONTIN) 300 MG capsule Take 600 mg by mouth 3 (three) times daily.    Yes Historical Provider, MD  ipratropium (ATROVENT HFA) 17 MCG/ACT inhaler Inhale 2 puffs into the lungs every 8 (eight) hours.   Yes Historical Provider, MD  lactulose (CEPHULAC) 20 G packet Take 1 packet by mouth 2 (two) times daily. 09/29/14  Yes Historical Provider, MD    metFORMIN (GLUCOPHAGE) 1000 MG tablet Take 500-1,000 mg by mouth 2 (two) times daily as needed (for high blood sugar).   Yes Historical Provider, MD  nystatin (MYCOSTATIN) powder Apply 1 application topically 3 (three) times daily. 09/29/14 09/29/15 Yes Historical Provider, MD  omeprazole (PRILOSEC) 20 MG capsule Take 40 mg by mouth 2 (two) times daily as needed (for acid reflux).    Yes Historical Provider, MD  Oxycodone HCl 10 MG TABS Take 0.5 tablets (5 mg total) by mouth every 4 (four) hours as needed for severe pain. 09/08/14  Yes Epifanio Lesches, MD  promethazine (PHENERGAN) 12.5 MG tablet Take 12.5 mg by mouth every 6 (six) hours. 09/29/14  Yes Historical Provider, MD  rizatriptan (MAXALT) 10 MG tablet Take 10 mg by mouth as needed for migraine.    Yes Historical Provider, MD  spironolactone (ALDACTONE) 100 MG tablet Take 1 tablet by mouth daily. 09/08/14  Yes Historical Provider, MD  cefdinir (OMNICEF) 300 MG capsule Take 300 mg by mouth 2 (two) times daily. 09/29/14   Historical Provider, MD  famotidine (PEPCID) 20 MG tablet Take 20 mg by mouth 2 (two) times daily. 09/29/14 10/29/14  Historical Provider, MD    Family History  Problem Relation Age of Onset  . Lung cancer Father   . Ulcers Father   . Heart disease Sister   . Ulcers Sister   . Heart disease Brother      Social History  Substance Use Topics  . Smoking status: Never Smoker   . Smokeless tobacco: Never Used  . Alcohol Use: No     Comment: occ    Allergies as of 10/04/2014 - Review Complete 10/04/2014  Allergen Reaction Noted  . Tape Swelling 10/03/2014  . Vicodin [hydrocodone-acetaminophen] Itching and Rash 01/07/2011    Review of Systems:    All systems reviewed and negative except where noted in HPI.   Physical Exam:  Vital signs in last 24 hours: Temp:  [97.8 F (36.6 C)-99 F (37.2 C)] 98 F (36.7 C) (10/02 0425) Pulse Rate:  [74-99] 81 (10/02 0425) Resp:  [13-24] 16 (10/02 0425) BP: (90-114)/(55-82)  90/62 mmHg (10/02 0425) SpO2:  [92 %-99 %] 96 % (10/02 0722) Last BM Date: 10/04/14 General:   Pleasant, cooperative in NAD obese, Head:  Normocephalic and atraumatic. Eyes:   No icterus.   Conjunctiva pink. PERRLA. Ears:  Normal auditory acuity. Neck:  Supple; no masses or thyroidomegaly Lungs: Respirations even and unlabored. Lungs clear to auscultation bilaterally.   No wheezes, crackles, or rhonchi.  Heart:  Regular rate and rhythm;  Without murmur, clicks, rubs or gallops Abdomen:  Soft, nondistended, nontender. Normal bowel sounds. No appreciable masses or hepatomegaly.  No rebound or guarding.  Rectal:  Not performed. Msk:  Symmetrical without gross deformities.  Strength  Extremities:  Without edema, cyanosis or clubbing. Neurologic:  Alert and oriented x3;  grossly normal neurologically. Skin:  Intact without significant lesions or rashes. Cervical Nodes:  No significant cervical adenopathy. Psych:  Alert and cooperative. Normal affect.  LAB RESULTS:  Recent Labs  10/03/14 0655 10/04/14 0347 10/05/14 0416  WBC 3.8 5.6 3.3*  HGB 11.2* 10.4* 9.5*  HCT 34.5* 31.1* 28.0*  PLT 244 258 214   BMET  Recent Labs  10/03/14 0655 10/04/14 0347 10/05/14 0416  NA 140 138 141  K 3.7 3.2* 3.0*  CL 104 106 109  CO2 27 27 26   GLUCOSE 101* 132* 84  BUN 8 8 7   CREATININE 0.45 0.62 0.73  CALCIUM 9.7 8.6* 7.8*   LFT  Recent Labs  10/04/14 0347  PROT 6.8  ALBUMIN 3.2*  AST 21  ALT 12*  ALKPHOS 54  BILITOT 1.6*   PT/INR No results for input(s): LABPROT, INR in the last 72 hours.  STUDIES: Ct Abdomen Pelvis W Contrast  10/04/2014   CLINICAL DATA:  Postoperative fever recent cholecystectomy with effusions. Biliary drain was necessitated.  EXAM: CT ABDOMEN AND PELVIS WITH CONTRAST  TECHNIQUE: Multidetector CT imaging of the abdomen and pelvis was performed using the standard protocol following bolus administration of intravenous contrast.  CONTRAST:  118m OMNIPAQUE  IOHEXOL 300 MG/ML  SOLN  COMPARISON:  CT 09/04/2014  FINDINGS: Lower chest: There is LEFT lower lobe atelectasis and effusion.  Hepatobiliary: Moderate volume of low-density free fluid surrounding the liver extending in to the pelvis along the pericolic gutters. Similar fluid surrounding the spleen and upper abdomen. The volume of fluid is slightly decreased from comparison exam of 09/04/2014. There is no biliary duct dilatation. Cholecystectomy clips in the gallbladder fossa. Common bile duct is mildly dilated 8 mm similar prior.  Pancreas: Pancreas is normal. No ductal dilatation. No pancreatic inflammation.  Spleen: Normal spleen  Adrenals/urinary tract: Adrenal glands and kidneys are normal. The ureters and bladder normal.  Stomach/Bowel: Stomach, small bowel, appendix, and cecum are normal. The colon and rectosigmoid colon are normal.  Vascular/Lymphatic: Abdominal aorta is normal caliber. There is no retroperitoneal or periportal lymphadenopathy. No pelvic lymphadenopathy.  Reproductive: Post hysterectomy.  Musculoskeletal: No aggressive osseous lesion.  Other: There is a new rectus sheath hematoma along the inferior aspect of the LEFT rectus muscle measuring 6.7 x 4.1 cm on image 81, series 2. There is a linear metallic density the structure through the rectus muscle at this level from which extends approximately 10 cm along the abdominal wall within the muscle consistent with a endovascular coil.  There is a new retroperitoneal hematoma in the RIGHT groin which displaces the bladder and measures 10.6 x 4.5 cm by 7.4 cm on image 86, series 2.  IMPRESSION: 1. Large volume of intraperitoneal free fluid is slightly decreased from comparison exam. Etiology includes bile leak or ascites from cirrhosis. If concern for bile leak consider nuclear medicine HIDA scan or sampling of the ascitic fluid. 2. No evidence of biloma in the gallbladder fossa. 3. LEFT rectus sheath hematoma with vascular coil. No comparison  available. 4. RIGHT retroperitoneal groin hematoma.  New from 09/04/2014. Findings conveyed toDr. JMicheline Chapman10/01/2014  at09:12.   Electronically Signed   By: SSuzy BouchardM.D.   On: 10/04/2014 09:12   Dg Chest Portable 1 View  10/04/2014   CLINICAL DATA:  Fever and shortness of breath  EXAM: PORTABLE CHEST 1 VIEW  COMPARISON:  Yesterday  FINDINGS: Small left pleural  effusion. The retrocardiac lung remains poorly evaluated due to patient size and hypoventilation; no definitive pneumonia. No edema or air leak. Mild cardiomegaly, accentuated by technique.  IMPRESSION: 1. No definitive pneumonia, but significantly limited in the retrocardiac lung due to patient factors. 2. Small left pleural effusion.   Electronically Signed   By: Monte Fantasia M.D.   On: 10/04/2014 04:21   Dg C-arm 1-60 Min  10/03/2014   CLINICAL DATA:  Fluoroscopy was utilized by the requesting physician. No radiographic interpretation.  EXAM: DG C-ARM 61-120 MIN  COMPARISON:  None.   Electronically Signed   By: Porfirio Mylar   On: 10/03/2014 14:26      Impression / Plan:   Lori Mckenzie is a 49 y.o. y/o female with with a history of nonalcoholic steatohepatitis. The patient has cirrhosis with ascites and the fluid from the ascites showed a PMN count of 200. Although this is below the required to 54 the diagnosis of SBP the patient does report that she was on antibiotics up until 2 days before admission. Therefore this patient likely has resolving SBP and should be kept on antibiotics for a total of 14 days with follow-up with her primary gastroenterologist as an outpatient. As stated above the patient reports that she would like to go home today. There is nothing further to do from a GI point of view except recommend antibiotics for her SBP. Please do not hesitate to call if you have any further questions   Thank you for involving me in the care of this patient.      LOS: 1 day   Ollen Bowl, MD  10/05/2014, 9:11  AM   Note: This dictation was prepared with Dragon dictation along with smaller phrase technology. Any transcriptional errors that result from this process are unintentional.

## 2014-10-05 NOTE — Progress Notes (Signed)
Diet advanced due to patient's request and per MD order but patient unable to tolerate. C/o pain with soft diet  Meal..

## 2014-10-06 DIAGNOSIS — R509 Fever, unspecified: Secondary | ICD-10-CM

## 2014-10-06 LAB — URINE CULTURE

## 2014-10-06 LAB — POTASSIUM: Potassium: 3.2 mmol/L — ABNORMAL LOW (ref 3.5–5.1)

## 2014-10-06 LAB — GLUCOSE, CAPILLARY
Glucose-Capillary: 89 mg/dL (ref 65–99)
Glucose-Capillary: 92 mg/dL (ref 65–99)

## 2014-10-06 MED ORDER — LOPERAMIDE HCL 2 MG PO CAPS: 2.0000 mg | ORAL_CAPSULE | Freq: Four times a day (QID) | ORAL | Status: DC | PRN

## 2014-10-06 MED ORDER — CIPROFLOXACIN HCL 500 MG PO TABS: 500.0000 mg | ORAL_TABLET | Freq: Two times a day (BID) | ORAL | Status: DC

## 2014-10-06 MED ORDER — HYDROMORPHONE HCL 1 MG/ML IJ SOLN
1.0000 mg | INTRAMUSCULAR | Status: DC | PRN
  Administered 2014-10-06: 1 mg via INTRAVENOUS
  Filled 2014-10-06: qty 1

## 2014-10-06 MED ORDER — ONDANSETRON HCL 4 MG PO TABS: 4.0000 mg | ORAL_TABLET | Freq: Four times a day (QID) | ORAL | Status: DC | PRN

## 2014-10-06 MED ORDER — SIMETHICONE 80 MG PO CHEW
80.0000 mg | CHEWABLE_TABLET | Freq: Four times a day (QID) | ORAL | Status: DC
  Administered 2014-10-06 (×2): 80 mg via ORAL
  Filled 2014-10-06 (×4): qty 1

## 2014-10-06 MED ORDER — OXYCODONE HCL 10 MG PO TABS: 10.0000 mg | ORAL_TABLET | ORAL | Status: DC | PRN

## 2014-10-06 MED ORDER — ENOXAPARIN SODIUM 40 MG/0.4ML ~~LOC~~ SOLN: 40.0000 mg | SUBCUTANEOUS | Status: DC

## 2014-10-06 MED ORDER — OXYCODONE HCL 5 MG PO TABS
10.0000 mg | ORAL_TABLET | ORAL | Status: DC | PRN
  Administered 2014-10-06: 10 mg via ORAL
  Filled 2014-10-06: qty 2

## 2014-10-06 MED ORDER — NYSTATIN 100000 UNIT/GM EX POWD: 1.0000 g | Freq: Three times a day (TID) | CUTANEOUS | Status: DC

## 2014-10-06 MED ORDER — SIMETHICONE 80 MG PO CHEW: 80.0000 mg | CHEWABLE_TABLET | Freq: Four times a day (QID) | ORAL | Status: DC

## 2014-10-06 NOTE — Progress Notes (Signed)
Subjective: The patient reports that she is having loose bowel movements with diarrhea and rectal incontinence. The patient also reports that she has abdominal pain in the left middle abdomen. The patient has been on Zosyn and I see that vancomycin is also present in the patient's room.   Objective: Vital signs in last 24 hours: Filed Vitals:   10/05/14 2122 10/06/14 0607 10/06/14 0759 10/06/14 0900  BP: 92/51 109/67  110/72  Pulse: 82 79  78  Temp: 98.4 F (36.9 C) 98.4 F (36.9 C)  98.7 F (37.1 C)  TempSrc: Oral Oral  Oral  Resp: 16 16  16   Height:      Weight:      SpO2: 97% 93% 99% 95%   Weight change:   Intake/Output Summary (Last 24 hours) at 10/06/14 1250 Last data filed at 10/06/14 0720  Gross per 24 hour  Intake 2535.27 ml  Output    250 ml  Net 2285.27 ml     Exam: Regular rate and rhythm clear to auscultation Abdomen soft distended positive ascites with positive carnet sign consistent with musculoskeletal pain.   Lab Results: @LABTEST2 @ Micro Results: Recent Results (from the past 240 hour(s))  Culture, blood (routine x 2)     Status: None (Preliminary result)   Collection Time: 10/04/14  3:47 AM  Result Value Ref Range Status   Specimen Description BLOOD LEFT HAND  Final   Special Requests BOTTLES DRAWN AEROBIC AND ANAEROBIC 4CC  Final   Culture NO GROWTH 2 DAYS  Final   Report Status PENDING  Incomplete  Urine culture     Status: None   Collection Time: 10/04/14  3:47 AM  Result Value Ref Range Status   Specimen Description URINE, RANDOM  Final   Special Requests NONE  Final   Culture MULTIPLE SPECIES PRESENT, SUGGEST RECOLLECTION  Final   Report Status 10/06/2014 FINAL  Final  Culture, blood (routine x 2)     Status: None (Preliminary result)   Collection Time: 10/04/14  3:50 AM  Result Value Ref Range Status   Specimen Description BLOOD LEFT ASSIST CONTROL  Final   Special Requests BOTTLES DRAWN AEROBIC AND ANAEROBIC 5CC  Final   Culture NO  GROWTH 2 DAYS  Final   Report Status PENDING  Incomplete  Body fluid culture     Status: None (Preliminary result)   Collection Time: 10/04/14  3:30 PM  Result Value Ref Range Status   Specimen Description PERITONEAL  Final   Special Requests NONE  Final   Gram Stain MODERATE WBC SEEN NO ORGANISMS SEEN   Final   Culture NO GROWTH 2 DAYS  Final   Report Status PENDING  Incomplete   Studies/Results: No results found. Medications: I have reviewed the patient's current medications. Scheduled Meds: . ciprofloxacin  400 mg Intravenous Q12H  . citalopram  20 mg Oral QHS  . famotidine  20 mg Oral BID  . gabapentin  600 mg Oral TID  . insulin aspart  0-15 Units Subcutaneous TID WC  . insulin aspart  0-5 Units Subcutaneous QHS  . ipratropium  0.5 mg Nebulization TID  . ipratropium-albuterol  3 mL Nebulization Once  . nystatin   Topical TID  . pantoprazole  40 mg Oral Daily  . simethicone  80 mg Oral QID  . sodium chloride  3 mL Intravenous Q12H  . spironolactone  100 mg Oral Daily   Continuous Infusions: . sodium chloride 75 mL/hr at 10/06/14 0334   PRN Meds:.sodium  chloride, acetaminophen, albuterol, ALPRAZolam, clotrimazole, fluticasone, HYDROmorphone (DILAUDID) injection, loperamide, ondansetron **OR** ondansetron (ZOFRAN) IV, oxyCODONE, sodium chloride   Assessment: Active Problems:   Sepsis (HCC)   SBP (spontaneous bacterial peritonitis) ([facility])   Diffuse abdominal pain    Plan: This patient is here with suspected SBP with a absolute neutrophil count of 200 but the patient had previously been on antibiotics. The patient has abdominal pain which is musculoskeletal in nature. The patient has orally been started on Imodium for her diarrhea. It is likely caused by the patient having multiple antibiotics. She will also have her stools checked for C. difficile.   LOS: 2 days   Daren Allen Norris 10/06/2014, 12:50 PM

## 2014-10-06 NOTE — Progress Notes (Signed)
Patient alert and oriented, independent, VSS, pt. Tolerating diet well with minimal complaints of nausear. Pain with meds given to control. Pt. Had IV removed tip intact. Stool specimens unable to be collected and sent down to lab prior to discharge. Pt. Had prescriptions given. Pt. Voiced understanding of discharge instructions with no further questions. Pt. Discharged via wheelchair with auxilliary.

## 2014-10-06 NOTE — Discharge Instructions (Signed)

## 2014-10-06 NOTE — Progress Notes (Addendum)
[place] [facility] [place] NAME: Lori Mckenzie    MR#:  [id number]  DATE OF BIRTH:  1965-08-08  SUBJECTIVE:  CHIEF COMPLAINT:   Chief Complaint  Patient presents with  . Fever  . Post-op Problem   patient is 49 year old Caucasian female with past medical history History of NASH with associated ascites, recent cholecystectomy with subsequent biliary stent placement due to biloma , who comes to the hospital with fevers as high as 101 and tachycardia. Patient underwent paracentesis which revealed nucleated cell count of 771, she was initiated on antibiotic therapy and admitted to the hospital for further evaluation and treatment. She still complains of abdominal pain, diffuse, She would like to get more pain medications, including intravenous Dilaudid.   Review of Systems  Constitutional: Negative for fever, chills and weight loss.  HENT: Negative for congestion.   Eyes: Negative for blurred vision and double vision.  Respiratory: Negative for cough, sputum production, shortness of breath and wheezing.   Cardiovascular: Negative for chest pain, palpitations, orthopnea, leg swelling and PND.  Gastrointestinal: Positive for abdominal pain and diarrhea. Negative for nausea, vomiting, constipation and blood in stool.  Genitourinary: Negative for dysuria, urgency, frequency and hematuria.  Musculoskeletal: Negative for falls.  Neurological: Negative for dizziness, tremors, focal weakness and headaches.  Endo/Heme/Allergies: Does not bruise/bleed easily.  Psychiatric/Behavioral: Negative for depression. The patient does not have insomnia.     VITAL SIGNS: Blood pressure 98/60, pulse 92, temperature 98.6 F (37 C), temperature source Oral, resp. rate 17, height 5' 2"  (1.575 m), weight 113.399 kg (250 lb), SpO2 92 %.  PHYSICAL EXAMINATION:   GENERAL:  49 y.o.-year-old patient lying in the bed with no acute distress.  EYES: Pupils equal, round, reactive  to light and accommodation. No scleral icterus. Extraocular muscles intact.  HEENT: Head atraumatic, normocephalic. Oropharynx and nasopharynx clear.  NECK:  Supple, no jugular venous distention. No thyroid enlargement, no tenderness.  LUNGS: Normal breath sounds bilaterally, no wheezing, rales,rhonchi or crepitation. No use of accessory muscles of respiration.  CARDIOVASCULAR: S1, S2 normal. No murmurs, rubs, or gallops.  ABDOMEN: Soft, mild discomfort diffusely on palpation but no rebound or guarding was noted, most discomfort is in the right upper quadrant. Some voluntary guarding, nondistended. Bowel sounds present, though diminished. No organomegaly or mass, although difficult to evaluate due to voluntary guarding.  EXTREMITIES: No pedal edema, cyanosis, or clubbing.  NEUROLOGIC: Cranial nerves II through XII are intact. Muscle strength 5/5 in all extremities. Sensation intact. Gait not checked.  PSYCHIATRIC: The patient is alert and oriented x 3. Sad affect SKIN: No obvious rash, lesion, or ulcer.   ORDERS/RESULTS REVIEWED:   CBC  Recent Labs Lab 10/03/14 0655 10/04/14 0347 10/05/14 0416  WBC 3.8 5.6 3.3*  HGB 11.2* 10.4* 9.5*  HCT 34.5* 31.1* 28.0*  PLT 244 258 214  MCV 92.1 91.4 92.3  MCH 30.0 30.7 31.2  MCHC 32.5 33.6 33.9  RDW 17.7* 17.3* 17.7*  LYMPHSABS  --  0.3* 0.5*  MONOABS  --  0.4 0.2  EOSABS  --  0.1 0.1  BASOSABS  --  0.0 0.0   ------------------------------------------------------------------------------------------------------------------  Chemistries   Recent Labs Lab 10/03/14 0655 10/04/14 0347 10/05/14 0416  NA 140 138 141  K 3.7 3.2* 3.0*  CL 104 106 109  CO2 27 27 26   GLUCOSE 101* 132* 84  BUN 8 8 7   CREATININE 0.45 0.62 0.73  CALCIUM 9.7 8.6* 7.8*  MG  --   --  1.9  AST 30 21  --   ALT 10* 12*  --   ALKPHOS 64 54  --   BILITOT 1.5* 1.6*  --     ------------------------------------------------------------------------------------------------------------------ estimated creatinine clearance is 99 mL/min (by C-G formula based on Cr of 0.73). ------------------------------------------------------------------------------------------------------------------ No results for input(s): TSH, T4TOTAL, T3FREE, THYROIDAB in the last 72 hours.  Invalid input(s): FREET3  Cardiac Enzymes  Recent Labs Lab 10/03/14 0655  TROPONINI <0.03   ------------------------------------------------------------------------------------------------------------------ Invalid input(s): POCBNP ---------------------------------------------------------------------------------------------------------------  RADIOLOGY: No results found.  EKG:  Orders placed or performed during the hospital encounter of 10/03/14  . ED EKG  . ED EKG    ASSESSMENT AND PLAN:  Active Problems:   Sepsis ([place])   SBP (spontaneous bacterial peritonitis) (HCC)   Diffuse abdominal pain   Pyrexia 1. Sepsis due to bacterial peritonitis, blood cultures are negative so far. Urine culture shows multiple species,  peritoneal fluid culture is pending, continue ciprofloxacin . Is unclear if  Patient is clinically improving since she has significant abdominal pains despite therapy and unremarkable CT scan 2. Spontaneous bacterial peritonitis. Continue ciprofloxacin, cultures are pending as above. Gastroenterology consultation is appreciated, continue pain medications and discharge patient home per Dr. Dorothey Baseman recommendations 3. Hypokalemia. Supplementing  IV, check potassium level today 4. Leukopenia, unclear etiology, differential showed normal absolute neutrophil count, follow tomorrow morning 5. History of biloma, not seen on most recent CT scan, patient has ongoing abdominal pain,  likely musculoskeletal. Per gastroenterologist. Continue pain medications and follow-up with Dr.  Gustavo Lah Management plans discussed with the patient, family and they are in agreement.   DRUG ALLERGIES:  Allergies  Allergen Reactions  . Tape Swelling  . Vicodin [Hydrocodone-Acetaminophen] Itching and Rash    CODE STATUS:     Code Status Orders        Start     Ordered   10/04/14 1300  Full code   Continuous     10/04/14 1259    Advance Directive Documentation        Most Recent Value   Type of Advance Directive  Healthcare Power of Attorney, Living will   Pre-existing out of facility DNR order (yellow form or pink MOST form)     "MOST" Form in Place?        TOTAL TIME TAKING CARE OF THIS PATIENT: 45 minutes.  NegotiATING pain medications and pain management WITH THE  Patient, has spent approximately 10 minutes. Prolonged discussion with Dr. Allen Norris about treatment,  management issues, DISCUSSED FOR ABOUT 10 MINUTES  Alessa Mazur M.D on 10/06/2014 at 1:09 PM  Between 7am to 6pm - Pager - [phone]  After 6pm go to [email or web address] - password [email or web address] [facility]  Office  [phone]  CC: Primary care physician; Ashkin, Neldon Labella, MD

## 2014-10-06 NOTE — Progress Notes (Signed)
Per Dr. Ether Griffins okay to put in GI consult with Dr. Gustavo Lah as well as reinstate previous dilaudid order.

## 2014-10-07 ENCOUNTER — Telehealth: Payer: Self-pay

## 2014-10-07 LAB — PATHOLOGIST SMEAR REVIEW

## 2014-10-07 NOTE — Care Management (Signed)
Post discharge: Received call from [facility] with [facility] stating that patient was here and will need resumption of care orders. I have paged DR. Vaickute for orders and will fax to [facility] home health asap.

## 2014-10-07 NOTE — Discharge Summary (Signed)
[facility] NAME: Lori Mckenzie    MR#:  [id number]  DATE OF BIRTH:  1965/09/20  DATE OF ADMISSION:  10/04/2014 ADMITTING PHYSICIAN: Baxter Hire, MD  DATE OF DISCHARGE: 10/06/2014  5:20 PM  PRIMARY CARE PHYSICIAN: Ashkin, Neldon Labella, MD     ADMISSION DIAGNOSIS:  Generalized abdominal pain [R10.84] SIRS (systemic inflammatory response syndrome) (HCC) [R65.10] Ascites [R18.8] Fever, unspecified fever cause [R50.9]  DISCHARGE DIAGNOSIS:  Active Problems:   Sepsis ([place])   SBP (spontaneous bacterial peritonitis) ([place])   Diffuse abdominal pain   Pyrexia   SECONDARY DIAGNOSIS:   Past Medical History  Diagnosis Date  . Brittle bone disease   . Asthma   . Thyroid disease   . Hypertension   . Back pain   . Diabetes mellitus without complication ([place])   . Chronic kidney disease   . Anxiety   . GERD (gastroesophageal reflux disease)   . Sleep apnea   . COPD (chronic obstructive pulmonary disease) ([place])   . Hypothyroidism   . Cervical disc disease   . TIA (transient ischemic attack)   . Cancer ([place])     Uteriine  ca 50yr ago partial hysterectomy  . Collagen vascular disease ([place]     RA  3-4 yrs ago  . Ascites   . 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 Patient underwent paracentesis which revealed nucleated cell count of 771 with ANC of 200, she was initiated on antibiotic therapy and admitted to the hospital for further evaluation and treatment. CT scan of abdomen and pelvis showed left rectus sheeth hematoma, decreased ascites, no biloma,. right retroperitoneal groin hematoma. Patient was evaluated by gastroenterologist and felt that she  should continue antibiotics for 14 days to complete SBP therapy. She was felt to be stable to be discharged home despite mild ongoing pains. Discussion  by problem: 1. Sepsis due to bacterial peritonitis, blood cultures are negative . Urine culture showed multiple species, peritoneal fluid culture is negative, continue ciprofloxacin for 14 days. She is to follow up with DR SGustavo Lahfor recommendations as outpatient. It is likely that pain is also due to rectus sheeth hematoma, which is expected to resolve eventually.  2. Spontaneous bacterial peritonitis. Continue ciprofloxacin, cultures are negative as above. Gastroenterology consultation is appreciated, continue pain medications and discharge patient home per Dr. WDorothey Basemanrecommendations for outpatient follow up. 3. Hypokalemia. Supplemented IV, potassium level on the day of discharge was still slightly low at 3.2, follow as outpatient. 4. Leukopenia, unclear etiology, differential showed normal absolute neutrophil count, follow as outpatient 5. History of biloma, not seen on most recent CT scan, patient has ongoing abdominal pain, likely musculoskeletal, per gastroenterologist DR WAllen Norris Continue pain medications and follow-up with Dr. SGustavo LahManagement plans discussed with the patient, family and they are in agreement DISCHARGE CONDITIONS:   stable  CONSULTS OBTAINED:  Treatment Team:  DLucilla Lame MD  DRUG ALLERGIES:   Allergies  Allergen Reactions  . Tape Swelling  . Vicodin [Hydrocodone-Acetaminophen] Itching and Rash    DISCHARGE MEDICATIONS:   Discharge Medication List as of 10/06/2014  4:41 PM    START taking these medications   Details  ciprofloxacin (CIPRO) 500 MG tablet Take  1 tablet (500 mg total) by mouth 2 (two) times daily., Starting 10/06/2014, Until Discontinued, Normal    loperamide (IMODIUM) 2 MG capsule Take 1 capsule (2 mg total) by mouth every 6 (six) hours as needed for diarrhea or loose stools., Starting  10/06/2014, Until Discontinued, Normal    !! nystatin (MYCOSTATIN/NYSTOP) 100000 UNIT/GM POWD Apply 1 g topically 3 (three) times daily., Starting 10/06/2014, Until Discontinued, Normal    ondansetron (ZOFRAN) 4 MG tablet Take 1 tablet (4 mg total) by mouth every 6 (six) hours as needed for nausea., Starting 10/06/2014, Until Discontinued, Normal    simethicone (MYLICON) 80 MG chewable tablet Chew 1 tablet (80 mg total) by mouth 4 (four) times daily., Starting 10/06/2014, Until Discontinued, Normal     !! - Potential duplicate medications found. Please discuss with provider.    CONTINUE these medications which have CHANGED   Details  Oxycodone HCl 10 MG TABS Take 1 tablet (10 mg total) by mouth every 3 (three) hours as needed for moderate pain or severe pain., Starting 10/06/2014, Until Discontinued, Print      CONTINUE these medications which have NOT CHANGED   Details  albuterol (PROVENTIL HFA;VENTOLIN HFA) 108 (90 BASE) MCG/ACT inhaler Inhale 2 puffs into the lungs every 4 (four) hours as needed for wheezing or shortness of breath., Until Discontinued, Historical Med    ALPRAZolam (XANAX) 1 MG tablet Take 1 tablet (1 mg total) by mouth 3 (three) times daily as needed for anxiety., Starting 08/06/2014, Until Discontinued, Print    citalopram (CELEXA) 20 MG tablet Take 20 mg by mouth at bedtime. , Until Discontinued, Historical Med    clotrimazole (MYCELEX) 10 MG troche Take 10 mg by mouth 4 (four) times daily as needed (for dry mouth). , Until Discontinued, Historical Med    fluticasone (FLONASE) 50 MCG/ACT nasal spray Place 1 spray into both nostrils daily as needed for rhinitis. , Until Discontinued, Historical Med    furosemide (LASIX) 40 MG tablet Take 80 mg by mouth 2 (two) times daily. , Until Discontinued, Historical Med    gabapentin (NEURONTIN) 300 MG capsule Take 600 mg by mouth 3 (three) times daily. , Until Discontinued, Historical Med    ipratropium (ATROVENT HFA) 17 MCG/ACT  inhaler Inhale 2 puffs into the lungs every 8 (eight) hours., Until Discontinued, Historical Med    !! nystatin (MYCOSTATIN) powder Apply 1 application topically 3 (three) times daily., Starting 09/29/2014, Until Tue 09/29/15, Historical Med    omeprazole (PRILOSEC) 20 MG capsule Take 40 mg by mouth 2 (two) times daily as needed (for acid reflux). , Until Discontinued, Historical Med    promethazine (PHENERGAN) 12.5 MG tablet Take 12.5 mg by mouth every 6 (six) hours., Starting 09/29/2014, Until Discontinued, Historical Med    rizatriptan (MAXALT) 10 MG tablet Take 10 mg by mouth as needed for migraine. , Until Discontinued, Historical Med    spironolactone (ALDACTONE) 100 MG tablet Take 1 tablet by mouth daily., Starting 09/08/2014, Until Discontinued, Historical Med    famotidine (PEPCID) 20 MG tablet Take 20 mg by mouth 2 (two) times daily., Starting 09/29/2014, Until Wed 10/29/14, Historical Med     !! - Potential duplicate medications found. Please discuss with provider.    STOP taking these medications     lactulose (CEPHULAC) 20 G packet      metFORMIN (GLUCOPHAGE) 1000 MG tablet      cefdinir (OMNICEF) 300 MG capsule          DISCHARGE INSTRUCTIONS:  Follow up with PCP Dr. [name], gastroenterologist Dr. Gustavo Lah.   If you experience worsening of your admission symptoms, develop shortness of breath, life threatening emergency, suicidal or homicidal thoughts you must seek medical attention immediately by calling 911 or calling your MD immediately  if symptoms less severe.  You Must read complete instructions/literature along with all the possible adverse reactions/side effects for all the Medicines you take and that have been prescribed to you. Take any new Medicines after you have completely understood and accept all the possible adverse reactions/side effects.   Please note  You were cared for by a hospitalist during your hospital stay. If you have any questions about your  discharge medications or the care you received while you were in the hospital after you are discharged, you can call the unit and asked to speak with the hospitalist on call if the hospitalist that took care of you is not available. Once you are discharged, your primary care physician will handle any further medical issues. Please note that NO REFILLS for any discharge medications will be authorized once you are discharged, as it is imperative that you return to your primary care physician (or establish a relationship with a primary care physician if you do not have one) for your aftercare needs so that they can reassess your need for medications and monitor your lab values.    Today   CHIEF COMPLAINT:   Chief Complaint  Patient presents with  . Fever  . Post-op Problem    HISTORY OF PRESENT ILLNESS:  Lori Mckenzie  is a 49 y.o. 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 Patient underwent paracentesis which revealed nucleated cell count of 771 with ANC of 200, she was initiated on antibiotic therapy and admitted to the hospital for further evaluation and treatment. CT scan of abdomen and pelvis showed left rectus sheeth hematoma, decreased ascites, no biloma,. right retroperitoneal groin hematoma. Patient was evaluated by gastroenterologist and felt that she should continue antibiotics for 14 days to complete SBP therapy. She was felt to be stable to be discharged home despite mild ongoing pains. Discussion  by problem: 1. Sepsis due to bacterial peritonitis, blood cultures are negative . Urine culture showed multiple species, peritoneal fluid culture is negative, continue ciprofloxacin for 14 days. She is to follow up with DR Gustavo Lah for recommendations as outpatient. It is likely that pain is also due to rectus sheeth hematoma, which  is expected to resolve eventually.  2. Spontaneous bacterial peritonitis. Continue ciprofloxacin, cultures are negative as above. Gastroenterology consultation is appreciated, continue pain medications and discharge patient home per Dr. Dorothey Baseman recommendations for outpatient follow up. 3. Hypokalemia. Supplemented IV, potassium level on the day of discharge was still slightly low at 3.2, follow as outpatient. 4. Leukopenia, unclear etiology, differential showed normal absolute neutrophil count, follow as outpatient 5. History of biloma, not seen on most recent CT scan, patient has ongoing abdominal pain, likely musculoskeletal, per gastroenterologist DR Allen Norris. Continue pain medications and follow-up with Dr. Gustavo Lah Management plans discussed with the patient, family and they are in agreement    VITAL SIGNS:  Blood pressure 98/60, pulse 92, temperature 98.6 F (37 C), temperature source Oral, resp. rate 17, height 5' 2"  (1.575 m), weight 113.399 kg (250 lb), SpO2 92 %.  I/O:   Intake/Output Summary (Last 24 hours) at  10/07/14 1559 Last data filed at 10/06/14 1604  Gross per 24 hour  Intake    346 ml  Output     50 ml  Net    296 ml    PHYSICAL EXAMINATION:  GENERAL:  49 y.o.-year-old patient lying in the bed with no acute distress.  EYES: Pupils equal, round, reactive to light and accommodation. No scleral icterus. Extraocular muscles intact.  HEENT: Head atraumatic, normocephalic. Oropharynx and nasopharynx clear.  NECK:  Supple, no jugular venous distention. No thyroid enlargement, no tenderness.  LUNGS: Normal breath sounds bilaterally, no wheezing, rales,rhonchi or crepitation. No use of accessory muscles of respiration.  CARDIOVASCULAR: S1, S2 normal. No murmurs, rubs, or gallops.  ABDOMEN: Soft, non-tender, non-distended. Bowel sounds present. No organomegaly or mass.  EXTREMITIES: No pedal edema, cyanosis, or clubbing.  NEUROLOGIC: Cranial nerves II through XII are intact.  Muscle strength 5/5 in all extremities. Sensation intact. Gait not checked.  PSYCHIATRIC: The patient is alert and oriented x 3.  SKIN: No obvious rash, lesion, or ulcer.   DATA REVIEW:   CBC  Recent Labs Lab 10/05/14 0416  WBC 3.3*  HGB 9.5*  HCT 28.0*  PLT 214    Chemistries   Recent Labs Lab 10/04/14 0347 10/05/14 0416 10/06/14 1409  NA 138 141  --   K 3.2* 3.0* 3.2*  CL 106 109  --   CO2 27 26  --   GLUCOSE 132* 84  --   BUN 8 7  --   CREATININE 0.62 0.73  --   CALCIUM 8.6* 7.8*  --   MG  --  1.9  --   AST 21  --   --   ALT 12*  --   --   ALKPHOS 54  --   --   BILITOT 1.6*  --   --     Cardiac Enzymes  Recent Labs Lab 10/03/14 0655  TROPONINI <0.03    Microbiology Results  Results for orders placed or performed during the hospital encounter of 10/04/14  Culture, blood (routine x 2)     Status: None (Preliminary result)   Collection Time: 10/04/14  3:47 AM  Result Value Ref Range Status   Specimen Description BLOOD LEFT HAND  Final   Special Requests BOTTLES DRAWN AEROBIC AND ANAEROBIC 4CC  Final   Culture NO GROWTH 2 DAYS  Final   Report Status PENDING  Incomplete  Urine culture     Status: None   Collection Time: 10/04/14  3:47 AM  Result Value Ref Range Status   Specimen Description URINE, RANDOM  Final   Special Requests NONE  Final   Culture MULTIPLE SPECIES PRESENT, SUGGEST RECOLLECTION  Final   Report Status 10/06/2014 FINAL  Final  Culture, blood (routine x 2)     Status: None (Preliminary result)   Collection Time: 10/04/14  3:50 AM  Result Value Ref Range Status   Specimen Description BLOOD LEFT ASSIST CONTROL  Final   Special Requests BOTTLES DRAWN AEROBIC AND ANAEROBIC 5CC  Final   Culture NO GROWTH 2 DAYS  Final   Report Status PENDING  Incomplete  Body fluid culture     Status: None (Preliminary result)   Collection Time: 10/04/14  3:30 PM  Result Value Ref Range Status   Specimen Description PERITONEAL  Final   Special  Requests NONE  Final   Gram Stain MODERATE WBC SEEN NO ORGANISMS SEEN   Final   Culture NO GROWTH 3 DAYS  Final  Report Status PENDING  Incomplete    RADIOLOGY:  No results found.  EKG:   Orders placed or performed during the hospital encounter of 10/03/14  . ED EKG  . ED EKG      Management plans discussed with the patient, family and they are in agreement.  CODE STATUS:  Advance Directive Documentation        Most Recent Value   Type of Advance Directive  Healthcare Power of Attorney, Living will   Pre-existing out of facility DNR order (yellow form or pink MOST form)     "MOST" Form in Place?        TOTAL TIME TAKING CARE OF THIS PATIENT: 40 minutes.    Theodoro Grist M.D on 10/07/2014 at 3:59 PM  Between 7am to 6pm - Pager - [phone]  After 6pm go to [email or web address] - password [email or web address] [facility]  Office  [phone]  CC: Primary care physician; Ashkin, Neldon Labella, MD

## 2014-10-07 NOTE — Telephone Encounter (Signed)
Patient's husband called asking for the status of her referral to the [facility]. Can you please check.

## 2014-10-08 LAB — BODY FLUID CULTURE: Culture: NO GROWTH

## 2014-10-08 NOTE — Telephone Encounter (Signed)
Angie, from [facility] called stating that they would be able to see patient. However, they are needing for [place] to fax them her record and then they will contact patient to schedule her appointment. I will go ahead and fax patient's records, images, and labs. Angie's direct line: [phone]                               [phone] Fax

## 2014-10-08 NOTE — Telephone Encounter (Signed)
Error

## 2014-10-08 NOTE — Telephone Encounter (Signed)
I have contacted the Pain clinic and spoke with Angie, she stated that the reason she has not been called is due to them having to wait on the physicians credentials being approved for hospital. Angie from the [facility] stated that she would have Caryl Pina call the patient today to make an appointment. I have contacted the patient to inform her of this information.

## 2014-10-08 NOTE — Care Management (Addendum)
Home health orders faxed to [facility] [phone]. Levada Dy with [facility] home health notified.

## 2014-10-09 LAB — CULTURE, BLOOD (ROUTINE X 2)
Culture: NO GROWTH
Culture: NO GROWTH

## 2014-10-10 NOTE — Telephone Encounter (Signed)
In pain

## 2014-10-13 ENCOUNTER — Telehealth: Payer: Self-pay

## 2014-10-13 ENCOUNTER — Ambulatory Visit: Payer: Medicaid Other | Admitting: Pain Medicine

## 2014-10-13 ENCOUNTER — Other Ambulatory Visit: Payer: Self-pay | Admitting: Surgery

## 2014-10-13 NOTE — Telephone Encounter (Signed)
Returned phone call to patient at this time. It was very difficult to understand the patient as her words were extremely slurred. Had to explain to patient that since she has been referred to pain clinic and missed her appointment, there is nothing further that we can do as far as pain medication. She asked several times during this call, if we could just prescribe enough to get her through until her next scheduled appointment with the pain clinic on 10/22/14. I reiterated multiple times that we are not going to be able to give her any further pain medication as she is now under care of pain clinic and that she should make this appointment a priority. She was not happy with this answer. I recommended for her to purchase a Calendar to help keep up with appointments better, to call her PCP with any further pain problems until her appointment, and to be sure to keep her appointment with the pain clinic that is scheduled on 10/22/14.

## 2014-10-13 NOTE — Telephone Encounter (Signed)
Patient called in once again and she is requesting pain medication once again. I explained to her that since she missed her pain clinic appointment this morning, we would not be able to give her pain medication. She begged me to check with the Physician on call to see if they would write her for some more pain medication.

## 2014-10-13 NOTE — Telephone Encounter (Signed)
Patient called and is asking for a refill on her pain medication, She had an appt for today at 11:00 but she missed the appt she thought it was 2:00, now they cant get her in till next week so patient is calling to see if we can refill her pain medication?

## 2014-10-13 NOTE — Telephone Encounter (Signed)
Consulted with Dr. Marina Gravel at this time regarding this matter. He explains that since patient has been made appointment and was unable to get to appointment this morning, nothing further can be done regarding pain medication from our standpoint.

## 2014-10-13 NOTE — Telephone Encounter (Signed)
Call made to patient at this time. Explained that I have discussed this matter with Dr. Marina Gravel and we will not be writing any further pain medication.  Patient was busy at the time, pt's husband answered the phone call, and he yelled in background "Just talk to her." to her husband. I explained situation below to patient's husband. He verbalizes understanding of this.

## 2014-10-22 ENCOUNTER — Ambulatory Visit: Payer: Medicaid Other | Admitting: Pain Medicine

## 2014-10-24 ENCOUNTER — Encounter: Payer: Self-pay | Admitting: Gastroenterology

## 2014-10-28 ENCOUNTER — Encounter: Payer: Self-pay | Admitting: Pain Medicine

## 2014-10-28 ENCOUNTER — Ambulatory Visit: Payer: Medicaid Other | Attending: Pain Medicine | Admitting: Pain Medicine

## 2014-10-28 VITALS — BP 107/86 | HR 85 | Temp 97.8°F | Resp 20 | Ht 62.0 in | Wt 220.0 lb

## 2014-10-28 DIAGNOSIS — I10 Essential (primary) hypertension: Secondary | ICD-10-CM | POA: Insufficient documentation

## 2014-10-28 DIAGNOSIS — F1190 Opioid use, unspecified, uncomplicated: Secondary | ICD-10-CM | POA: Diagnosis not present

## 2014-10-28 DIAGNOSIS — Z79891 Long term (current) use of opiate analgesic: Secondary | ICD-10-CM

## 2014-10-28 DIAGNOSIS — J449 Chronic obstructive pulmonary disease, unspecified: Secondary | ICD-10-CM | POA: Insufficient documentation

## 2014-10-28 DIAGNOSIS — M542 Cervicalgia: Secondary | ICD-10-CM | POA: Insufficient documentation

## 2014-10-28 DIAGNOSIS — M545 Low back pain, unspecified: Secondary | ICD-10-CM

## 2014-10-28 DIAGNOSIS — R109 Unspecified abdominal pain: Secondary | ICD-10-CM | POA: Insufficient documentation

## 2014-10-28 DIAGNOSIS — M549 Dorsalgia, unspecified: Secondary | ICD-10-CM | POA: Diagnosis present

## 2014-10-28 DIAGNOSIS — F329 Major depressive disorder, single episode, unspecified: Secondary | ICD-10-CM | POA: Diagnosis not present

## 2014-10-28 DIAGNOSIS — G4730 Sleep apnea, unspecified: Secondary | ICD-10-CM

## 2014-10-28 DIAGNOSIS — K219 Gastro-esophageal reflux disease without esophagitis: Secondary | ICD-10-CM | POA: Diagnosis not present

## 2014-10-28 DIAGNOSIS — Z79899 Other long term (current) drug therapy: Secondary | ICD-10-CM | POA: Diagnosis not present

## 2014-10-28 DIAGNOSIS — M5441 Lumbago with sciatica, right side: Secondary | ICD-10-CM

## 2014-10-28 DIAGNOSIS — M79605 Pain in left leg: Secondary | ICD-10-CM

## 2014-10-28 DIAGNOSIS — Z5181 Encounter for therapeutic drug level monitoring: Secondary | ICD-10-CM

## 2014-10-28 DIAGNOSIS — G8929 Other chronic pain: Secondary | ICD-10-CM | POA: Insufficient documentation

## 2014-10-28 DIAGNOSIS — G2581 Restless legs syndrome: Secondary | ICD-10-CM | POA: Insufficient documentation

## 2014-10-28 DIAGNOSIS — R1013 Epigastric pain: Secondary | ICD-10-CM | POA: Diagnosis not present

## 2014-10-28 DIAGNOSIS — M79604 Pain in right leg: Secondary | ICD-10-CM

## 2014-10-28 DIAGNOSIS — M5090 Cervical disc disorder, unspecified, unspecified cervical region: Secondary | ICD-10-CM

## 2014-10-28 DIAGNOSIS — M5442 Lumbago with sciatica, left side: Secondary | ICD-10-CM

## 2014-10-28 NOTE — Progress Notes (Signed)
Patient did not bring medications.  Verbally reconciled.
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
## 2014-11-16 DIAGNOSIS — R55 Syncope and collapse: Secondary | ICD-10-CM | POA: Insufficient documentation

## 2014-11-16 DIAGNOSIS — R400 Somnolence: Secondary | ICD-10-CM | POA: Insufficient documentation

## 2014-11-16 HISTORY — DX: Syncope and collapse: R55

## 2014-11-20 ENCOUNTER — Other Ambulatory Visit: Payer: Self-pay | Admitting: Pain Medicine

## 2014-12-10 ENCOUNTER — Ambulatory Visit: Payer: Medicaid Other | Attending: Pain Medicine | Admitting: Pain Medicine

## 2014-12-10 ENCOUNTER — Encounter: Payer: Self-pay | Admitting: Pain Medicine

## 2014-12-10 VITALS — BP 113/70 | HR 90 | Temp 97.8°F | Resp 18 | Ht 62.0 in | Wt 215.0 lb

## 2014-12-10 DIAGNOSIS — K219 Gastro-esophageal reflux disease without esophagitis: Secondary | ICD-10-CM | POA: Diagnosis not present

## 2014-12-10 DIAGNOSIS — Z79899 Other long term (current) drug therapy: Secondary | ICD-10-CM

## 2014-12-10 DIAGNOSIS — M545 Low back pain, unspecified: Secondary | ICD-10-CM

## 2014-12-10 DIAGNOSIS — M4806 Spinal stenosis, lumbar region: Secondary | ICD-10-CM | POA: Insufficient documentation

## 2014-12-10 DIAGNOSIS — M5090 Cervical disc disorder, unspecified, unspecified cervical region: Secondary | ICD-10-CM | POA: Diagnosis not present

## 2014-12-10 DIAGNOSIS — I10 Essential (primary) hypertension: Secondary | ICD-10-CM | POA: Diagnosis not present

## 2014-12-10 DIAGNOSIS — G8929 Other chronic pain: Secondary | ICD-10-CM | POA: Diagnosis not present

## 2014-12-10 DIAGNOSIS — N189 Chronic kidney disease, unspecified: Secondary | ICD-10-CM | POA: Diagnosis not present

## 2014-12-10 DIAGNOSIS — J449 Chronic obstructive pulmonary disease, unspecified: Secondary | ICD-10-CM | POA: Diagnosis not present

## 2014-12-10 DIAGNOSIS — M79604 Pain in right leg: Secondary | ICD-10-CM | POA: Diagnosis not present

## 2014-12-10 DIAGNOSIS — R109 Unspecified abdominal pain: Secondary | ICD-10-CM

## 2014-12-10 DIAGNOSIS — M542 Cervicalgia: Secondary | ICD-10-CM

## 2014-12-10 DIAGNOSIS — F419 Anxiety disorder, unspecified: Secondary | ICD-10-CM | POA: Diagnosis not present

## 2014-12-10 DIAGNOSIS — R1013 Epigastric pain: Secondary | ICD-10-CM | POA: Diagnosis not present

## 2014-12-10 DIAGNOSIS — M549 Dorsalgia, unspecified: Secondary | ICD-10-CM | POA: Diagnosis present

## 2014-12-10 DIAGNOSIS — M5416 Radiculopathy, lumbar region: Secondary | ICD-10-CM | POA: Diagnosis not present

## 2014-12-10 DIAGNOSIS — E119 Type 2 diabetes mellitus without complications: Secondary | ICD-10-CM | POA: Diagnosis not present

## 2014-12-10 DIAGNOSIS — Z79891 Long term (current) use of opiate analgesic: Secondary | ICD-10-CM

## 2014-12-10 DIAGNOSIS — F1190 Opioid use, unspecified, uncomplicated: Secondary | ICD-10-CM

## 2014-12-10 DIAGNOSIS — E876 Hypokalemia: Secondary | ICD-10-CM | POA: Insufficient documentation

## 2014-12-10 DIAGNOSIS — M79606 Pain in leg, unspecified: Secondary | ICD-10-CM | POA: Diagnosis present

## 2014-12-10 DIAGNOSIS — G4733 Obstructive sleep apnea (adult) (pediatric): Secondary | ICD-10-CM | POA: Diagnosis not present

## 2014-12-10 DIAGNOSIS — Z5181 Encounter for therapeutic drug level monitoring: Secondary | ICD-10-CM

## 2014-12-10 DIAGNOSIS — Z8673 Personal history of transient ischemic attack (TIA), and cerebral infarction without residual deficits: Secondary | ICD-10-CM | POA: Diagnosis not present

## 2014-12-10 DIAGNOSIS — M79605 Pain in left leg: Secondary | ICD-10-CM

## 2014-12-10 MED ORDER — OXYCODONE-ACETAMINOPHEN 10-325 MG PO TABS: 1.0000 | ORAL_TABLET | Freq: Every day | ORAL | Status: DC | PRN

## 2014-12-10 NOTE — Progress Notes (Signed)
Patient's Name: Lori Mckenzie MRN: [id number] DOB: 1965/11/08 DOS: 12/10/2014  Primary Reason(s) for Visit: Encounter for evaluation before starting a Medication CC: Back Pain and Leg Pain   HPI:   Lori Mckenzie is a 49 y.o. year old, female patient, who returns today as an established patient. She has Type 2 diabetes mellitus ([place]); COPD (chronic obstructive pulmonary disease) (HCC); HTN (hypertension); GERD (gastroesophageal reflux disease); OSA on CPAP; Anxiety; Abdominal pain; Steatohepatitis; Bile leak, postoperative; Dysthymia; Other social stressor; Abdominal abscess ([place]); Ascites; NASH (nonalcoholic steatohepatitis); Hypokalemia; Diffuse abdominal pain; Pyrexia; Chronic pain; Opiate use; Long term prescription opiate use; Long term current use of opiate analgesic; Encounter for therapeutic drug level monitoring; Abdominal pain, chronic, epigastric; Chronic low back pain; Chronic neck pain; Bilateral lower extremity pain; Breath shortness; Diastolic dysfunction; Clinical depression; Chronic obstructive pulmonary disease ([place]); Airway hyperreactivity; Anxiety state; Acid reflux; Essential (primary) hypertension; Lumbar canal stenosis; Lumbar and sacral osteoarthritis; Myofascial pain; Sleep apnea; Cervical disc disease; Fall; Drowsiness; Episode of syncope; and Subacute lumbar radiculopathy (left side) (S1 dermatome) on her problem list.. Her primarily concern today is the Back Pain and Leg Pain     The patient comes into the clinic's today with a complaint of low back pain and leg pain. Her primary is the low back with the left side being worse than the right. In the case of the leg it is the left leg is bothering her and the pain goes down to the bottom of her foot in what seems to be an S1 dermatomal distribution. She also complains of cramps that occur primarily at night in the area of the calf and the arc of the foot. The patient was instructed to start taking multivitamins every day as well as some  kind of sports drink like Gatorade, but with less sugar, to replenish electrolytes.  During today's visit, the patient was slurring her speech even though she was saying that she had run out of pain medication sometime ago. The patient apparently takes quite a bit of Xanax and Dr. Burman Riis. She indicates that she is content to meet with him this Friday. I have recommended that based on the CDC opioid guidelines, she started tapering down and stopping the Xanax. I have talked to the patient about the pharmacology and the pharmacokinetics of Xanax and I have explained that the medication last about 11 hours in the system and therefore she cannot take any opioids within that time frame because of the possibility of drug drug interaction or respiratory depression.  Communicating to this couple (husband and wife) is very difficult since they do not allow me to finish any of my sentences and they both speak at the same time. I hope they did not perceive me as rude when I interrupted them and attempted to provide some guidelines as to how we should communicate, otherwise would have to not finished in a timely manner.  Because of her lower extremity symptoms and the fact that she describes them as being no and having started after a bout of severe sneezing, I will be ordering an MRI of the lumbar spine to assess for any possible new disc herniations.  Today's Pain Score: 9 , clinically the patient looks more like a 2-3/10. Clear symptom exaggeration. Reported level of pain is incompatible with clinical observations. Pain Type: Chronic pain Pain Location: Back (coccyx) Pain Orientation: Right Pain Descriptors / Indicators: Sharp, Stabbing Pain Frequency: Constant  Date of Last Visit: 10/28/14 Service Provided on Last Visit: Evaluation  Pharmacotherapy Review:   Side-effects or Adverse reactions: None reported. Effectiveness: Described as relatively effective, allowing for increase in activities of daily  living (ADL). Onset of action: Within expected pharmacological parameters. Duration of action: Within normal limits for medication. Peak effect: Timing and results are as within normal expected parameters. [place] PMP: Compliant with practice rules and regulations. UDS Results:  Lab Results  Component Value Date   THCU NEGATIVE 08/26/2013   PCPSCRNUR NEGATIVE 08/26/2013   MDMA NEGATIVE 08/26/2013   AMPHETMU NEGATIVE 08/26/2013   METHADONE NEGATIVE 08/26/2013   ETOH < 3 10/18/2011     UDS Interpretation: Patient appears to be compliant with practice rules and regulations Medication Assessment Form: Reviewed. Patient indicates being compliant with therapy Treatment compliance: Compliant Substance Use Disorder (SUD) Risk Level: Low Pharmacologic Plan: Continue therapy as is  Last Available Lab Work: Admission on 10/04/2014, Discharged on 10/06/2014  Component Date Value Ref Range Status  . Sodium 10/04/2014 138  135 - 145 mmol/L Final  . Potassium 10/04/2014 3.2* 3.5 - 5.1 mmol/L Final  . Chloride 10/04/2014 106  101 - 111 mmol/L Final  . CO2 10/04/2014 27  22 - 32 mmol/L Final  . Glucose, Bld 10/04/2014 132* 65 - 99 mg/dL Final  . BUN 10/04/2014 8  6 - 20 mg/dL Final  . Creatinine, Ser 10/04/2014 0.62  0.44 - 1.00 mg/dL Final  . Calcium 10/04/2014 8.6* 8.9 - 10.3 mg/dL Final  . Total Protein 10/04/2014 6.8  6.5 - 8.1 g/dL Final  . Albumin 10/04/2014 3.2* 3.5 - 5.0 g/dL Final  . AST 10/04/2014 21  15 - 41 U/L Final  . ALT 10/04/2014 12* 14 - 54 U/L Final  . Alkaline Phosphatase 10/04/2014 54  38 - 126 U/L Final  . Total Bilirubin 10/04/2014 1.6* 0.3 - 1.2 mg/dL Final  . GFR calc non Af Amer 10/04/2014 >60  >60 mL/min Final  . GFR calc Af Amer 10/04/2014 >60  >60 mL/min Final  . Anion gap 10/04/2014 5  5 - 15 Final  . Specimen Description 10/04/2014 BLOOD LEFT HAND   Final  . Special Requests 10/04/2014 BOTTLES DRAWN AEROBIC AND ANAEROBIC 4CC   Final  . Culture 10/04/2014 NO GROWTH  5 DAYS   Final  . Report Status 10/04/2014 10/09/2014 FINAL   Final  . Specimen Description 10/04/2014 BLOOD LEFT ASSIST CONTROL   Final  . Special Requests 10/04/2014 BOTTLES DRAWN AEROBIC AND ANAEROBIC 5CC   Final  . Culture 10/04/2014 NO GROWTH 5 DAYS   Final  . Report Status 10/04/2014 10/09/2014 FINAL   Final  . Specimen Description 10/04/2014 URINE, RANDOM   Final  . Special Requests 10/04/2014 NONE   Final  . Culture 10/04/2014 MULTIPLE SPECIES PRESENT, SUGGEST RECOLLECTION   Final  . Report Status 10/04/2014 10/06/2014 FINAL   Final  . WBC 10/04/2014 5.6  3.6 - 11.0 K/uL Final  . RBC 10/04/2014 3.40* 3.80 - 5.20 MIL/uL Final  . Hemoglobin 10/04/2014 10.4* 12.0 - 16.0 g/dL Final  . HCT 10/04/2014 31.1* 35.0 - 47.0 % Final  . MCV 10/04/2014 91.4  80.0 - 100.0 fL Final  . MCH 10/04/2014 30.7  26.0 - 34.0 pg Final  . MCHC 10/04/2014 33.6  32.0 - 36.0 g/dL Final  . RDW 10/04/2014 17.3* 11.5 - 14.5 % Final  . Platelets 10/04/2014 258  150 - 440 K/uL Final  . Neutrophils Relative % 10/04/2014 87   Final  . Neutro Abs 10/04/2014 4.8  1.4 - 6.5 K/uL Final  .  Lymphocytes Relative 10/04/2014 5   Final  . Lymphs Abs 10/04/2014 0.3* 1.0 - 3.6 K/uL Final  . Monocytes Relative 10/04/2014 7   Final  . Monocytes Absolute 10/04/2014 0.4  0.2 - 0.9 K/uL Final  . Eosinophils Relative 10/04/2014 1   Final  . Eosinophils Absolute 10/04/2014 0.1  0 - 0.7 K/uL Final  . Basophils Relative 10/04/2014 0   Final  . Basophils Absolute 10/04/2014 0.0  0 - 0.1 K/uL Final  . Lactic Acid, Venous 10/04/2014 1.2  0.5 - 2.0 mmol/L Final  . Lactic Acid, Venous 10/04/2014 0.9  0.5 - 2.0 mmol/L Final  . Color, Urine 10/04/2014 AMBER* YELLOW Final  . APPearance 10/04/2014 HAZY* CLEAR Final  . Glucose, UA 10/04/2014 NEGATIVE  NEGATIVE mg/dL Final  . Bilirubin Urine 10/04/2014 NEGATIVE  NEGATIVE Final  . Ketones, ur 10/04/2014 NEGATIVE  NEGATIVE mg/dL Final  . Specific Gravity, Urine 10/04/2014 1.014  1.005 -  1.030 Final  . Hgb urine dipstick 10/04/2014 NEGATIVE  NEGATIVE Final  . pH 10/04/2014 5.0  5.0 - 8.0 Final  . Protein, ur 10/04/2014 NEGATIVE  NEGATIVE mg/dL Final  . Nitrite 10/04/2014 NEGATIVE  NEGATIVE Final  . Leukocytes, UA 10/04/2014 NEGATIVE  NEGATIVE Final  . RBC / HPF 10/04/2014 0-5  0 - 5 RBC/hpf Final  . WBC, UA 10/04/2014 0-5  0 - 5 WBC/hpf Final  . Bacteria, UA 10/04/2014 RARE* NONE SEEN Final  . Squamous Epithelial / LPF 10/04/2014 6-30* NONE SEEN Final  . Mucous 10/04/2014 PRESENT   Final  . Fluid Type-FCT 10/04/2014 CYTO PERI   Final  . Color, Fluid 10/04/2014 RED* YELLOW Final  . Appearance, Fluid 10/04/2014 CLOUDY* CLEAR Final  . WBC, Fluid 10/04/2014 771   Final  . Neutrophil Count, Fluid 10/04/2014 26   Final  . Lymphs, Fluid 10/04/2014 29   Final  . Monocyte-Macrophage-Serous Fluid 10/04/2014 45   Final  . Eos, Fluid 10/04/2014 0   Final  . Other Cells, Fluid 10/04/2014 0   Final  . Specimen Description 10/04/2014 PERITONEAL   Final  . Special Requests 10/04/2014 NONE   Final  . Gram Stain 10/04/2014    Final                   Value:MODERATE WBC SEEN NO ORGANISMS SEEN   . Culture 10/04/2014 NO GROWTH 4 DAYS   Final  . Report Status 10/04/2014 10/08/2014 FINAL   Final  . Glucose-Capillary 10/04/2014 68  65 - 99 mg/dL Final  . Comment 1 10/04/2014 Notify RN   Final  . Sodium 10/05/2014 141  135 - 145 mmol/L Final  . Potassium 10/05/2014 3.0* 3.5 - 5.1 mmol/L Final  . Chloride 10/05/2014 109  101 - 111 mmol/L Final  . CO2 10/05/2014 26  22 - 32 mmol/L Final  . Glucose, Bld 10/05/2014 84  65 - 99 mg/dL Final  . BUN 10/05/2014 7  6 - 20 mg/dL Final  . Creatinine, Ser 10/05/2014 0.73  0.44 - 1.00 mg/dL Final  . Calcium 10/05/2014 7.8* 8.9 - 10.3 mg/dL Final  . GFR calc non Af Amer 10/05/2014 >60  >60 mL/min Final  . GFR calc Af Amer 10/05/2014 >60  >60 mL/min Final  . Anion gap 10/05/2014 6  5 - 15 Final  . WBC 10/05/2014 3.3* 3.6 - 11.0 K/uL Final  . RBC  10/05/2014 3.03* 3.80 - 5.20 MIL/uL Final  . Hemoglobin 10/05/2014 9.5* 12.0 - 16.0 g/dL Final  . HCT 10/05/2014 28.0*  35.0 - 47.0 % Final  . MCV 10/05/2014 92.3  80.0 - 100.0 fL Final  . MCH 10/05/2014 31.2  26.0 - 34.0 pg Final  . MCHC 10/05/2014 33.9  32.0 - 36.0 g/dL Final  . RDW 10/05/2014 17.7* 11.5 - 14.5 % Final  . Platelets 10/05/2014 214  150 - 440 K/uL Final  . Glucose-Capillary 10/04/2014 84  65 - 99 mg/dL Final  . Path Review 10/04/2014 Smear of peritoneal fluid reveals reactive mesothelial cells and mixed inflammation.   Final  . Glucose-Capillary 10/04/2014 74  65 - 99 mg/dL Final  . Glucose-Capillary 10/05/2014 81  65 - 99 mg/dL Final  . Glucose-Capillary 10/05/2014 96  65 - 99 mg/dL Final  . Magnesium 10/05/2014 1.9  1.7 - 2.4 mg/dL Final  . Neutrophils Relative % 10/05/2014 73   Final  . Neutro Abs 10/05/2014 2.4  1.4 - 6.5 K/uL Final  . Lymphocytes Relative 10/05/2014 14   Final  . Lymphs Abs 10/05/2014 0.5* 1.0 - 3.6 K/uL Final  . Monocytes Relative 10/05/2014 7   Final  . Monocytes Absolute 10/05/2014 0.2  0.2 - 0.9 K/uL Final  . Eosinophils Relative 10/05/2014 5   Final  . Eosinophils Absolute 10/05/2014 0.1  0 - 0.7 K/uL Final  . Basophils Relative 10/05/2014 1   Final  . Basophils Absolute 10/05/2014 0.0  0 - 0.1 K/uL Final  . Glucose-Capillary 10/05/2014 96  65 - 99 mg/dL Final  . Glucose-Capillary 10/05/2014 85  65 - 99 mg/dL Final  . Glucose-Capillary 10/06/2014 89  65 - 99 mg/dL Final  . Comment 1 10/06/2014 Notify RN   Final  . Glucose-Capillary 10/06/2014 92  65 - 99 mg/dL Final  . Potassium 10/06/2014 3.2* 3.5 - 5.1 mmol/L Final  Admission on 10/03/2014, Discharged on 10/03/2014  Component Date Value Ref Range Status  . Glucose-Capillary 10/03/2014 95  65 - 99 mg/dL Final  Admission on 10/03/2014, Discharged on 10/03/2014  Component Date Value Ref Range Status  . WBC 10/03/2014 3.8  3.6 - 11.0 K/uL Final  . RBC 10/03/2014 3.75* 3.80 - 5.20 MIL/uL  Final  . Hemoglobin 10/03/2014 11.2* 12.0 - 16.0 g/dL Final  . HCT 10/03/2014 34.5* 35.0 - 47.0 % Final  . MCV 10/03/2014 92.1  80.0 - 100.0 fL Final  . MCH 10/03/2014 30.0  26.0 - 34.0 pg Final  . MCHC 10/03/2014 32.5  32.0 - 36.0 g/dL Final  . RDW 10/03/2014 17.7* 11.5 - 14.5 % Final  . Platelets 10/03/2014 244  150 - 440 K/uL Final  . Sodium 10/03/2014 140  135 - 145 mmol/L Final  . Potassium 10/03/2014 3.7  3.5 - 5.1 mmol/L Final  . Chloride 10/03/2014 104  101 - 111 mmol/L Final  . CO2 10/03/2014 27  22 - 32 mmol/L Final  . Glucose, Bld 10/03/2014 101* 65 - 99 mg/dL Final  . BUN 10/03/2014 8  6 - 20 mg/dL Final  . Creatinine, Ser 10/03/2014 0.45  0.44 - 1.00 mg/dL Final  . Calcium 10/03/2014 9.7  8.9 - 10.3 mg/dL Final  . Total Protein 10/03/2014 7.8  6.5 - 8.1 g/dL Final  . Albumin 10/03/2014 3.5  3.5 - 5.0 g/dL Final  . AST 10/03/2014 30  15 - 41 U/L Final  . ALT 10/03/2014 10* 14 - 54 U/L Final  . Alkaline Phosphatase 10/03/2014 64  38 - 126 U/L Final  . Total Bilirubin 10/03/2014 1.5* 0.3 - 1.2 mg/dL Final  . GFR calc non Af Wyvonnia Lora  10/03/2014 >60  >60 mL/min Final  . GFR calc Af Amer 10/03/2014 >60  >60 mL/min Final  . Anion gap 10/03/2014 9  5 - 15 Final  . Troponin I 10/03/2014 <0.03  <0.031 ng/mL Final    Allergies: Lori Mckenzie is allergic to tape and vicodin.  Meds: The patient has a current medication list which includes the following prescription(s): albuterol, alprazolam, citalopram, clotrimazole, fluticasone, furosemide, gabapentin, loperamide, nystatin, nystatin, ondansetron, oxycodone-acetaminophen, promethazine, rifaximin, rizatriptan, simethicone, spironolactone, and famotidine. Requested Prescriptions   Signed Prescriptions Disp Refills  . oxyCODONE-acetaminophen (PERCOCET) 10-325 MG tablet 120 tablet 0    Sig: Take 1 tablet by mouth 5 (five) times daily as needed for pain. Do not take for 11 hours after taking Xanax. Max.: 5/day.    ROS: Constitutional:  Afebrile, no chills, well hydrated and well nourished Gastrointestinal: negative Musculoskeletal:negative Neurological: negative Behavioral/Psych: negative  PFSH: Medical:  Lori Mckenzie  has a past medical history of Brittle bone disease; Asthma; Thyroid disease; Hypertension; Back pain; Diabetes mellitus without complication ([place]); Chronic kidney disease; Anxiety; GERD (gastroesophageal reflux disease); Sleep apnea; COPD (chronic obstructive pulmonary disease) ([place]); Hypothyroidism; Cervical disc disease; TIA (transient ischemic attack); Cancer ([place]); Collagen vascular disease ([place]); Ascites; NASH (nonalcoholic steatohepatitis); and NASH (nonalcoholic steatohepatitis). Family: family history includes Heart disease in her brother and sister; Lung cancer in her father; Ulcers in her father and sister. Surgical:  has past surgical history that includes Abdominal hysterectomy; Tubal ligation; Colonoscopy with propofol (N/A, 06/23/2014); Esophagogastroduodenoscopy (N/A, 06/23/2014); Cholecystectomy (N/A, 07/15/2014); ERCP (N/A, 07/16/2014); Wisdom tooth extraction; and ERCP (N/A, 10/03/2014). Tobacco:  reports that she has never smoked. She has never used smokeless tobacco. Alcohol:  reports that she does not drink alcohol. Drug:  reports that she does not use illicit drugs.  Physical Exam: Vitals:  Today's Vitals   12/10/14 1053 12/10/14 1055  BP:  113/70  Pulse: 90   Temp: 97.8 F (36.6 C)   Resp: 18   Height: 5' 2"  (1.575 m)   Weight: 215 lb (97.523 kg)   SpO2: 97%   PainSc: 9  9   PainLoc: Back   Calculated BMI: Body mass index is 39.31 kg/(m^2). General appearance: alert, cooperative, appears stated age and no distress Eyes: PERLA Respiratory: No evidence respiratory distress, no audible rales or ronchi and no use of accessory muscles of respiration Neck: no adenopathy, no carotid bruit, no JVD, supple, symmetrical, trachea midline and thyroid not enlarged, symmetric, no  tenderness/mass/nodules  Cervical Spine ROM: Adequate for flexion, extension, rotation, and lateral bending Palpation: No palpable trigger points  Upper Extremities ROM: Adequate bilaterally Strength: 5/5 for all flexors and extensors of the upper extremity, bilaterally Pulses: Palpable bilaterally Neurologic: No allodynia, No hyperesthesia, No hyperpathia and No sensory abnormalities  Lumbar Spine ROM: Adequate for flexion, extension, rotation, and lateral bending Palpation: No palpable trigger points Lumbar Hyperextension and rotation: Non-contributory Patrick's Maneuver: Non-contributory  Lower Extremities ROM: Adequate bilaterally Strength: 5/5 for all flexors and extensors of the lower extremity, bilaterally Pulses: Palpable bilaterally Neurologic: No allodynia, No hyperesthesia, No hyperpathia, No sensory abnormalities and No antalgic gait or posture  Assessment: Encounter Diagnosis:  Primary Diagnosis: Chronic pain [G89.29]  Plan: Interventional: No interventional therapies planned at this time.  [place] was seen today for back pain and leg pain.  Diagnoses and all orders for this visit:  Chronic pain -     B12 and Folate Panel; Future -     COMPLETE METABOLIC PANEL WITH GFR; Future -  C-reactive protein; Future -     Magnesium; Future -     Sedimentation rate; Future -     Vitamin D pnl(25-hydrxy+1,25-dihy)-bld; Future -     oxyCODONE-acetaminophen (PERCOCET) 10-325 MG tablet; Take 1 tablet by mouth 5 (five) times daily as needed for pain. Do not take for 11 hours after taking Xanax. Max.: 5/day.  Chronic low back pain  Bilateral lower extremity pain  Cervical disc disease  Chronic neck pain  Abdominal pain, unspecified abdominal location  Abdominal pain, chronic, epigastric  Opiate use  Long term prescription opiate use  Long term current use of opiate analgesic -     Drugs of abuse screen w/o alc, rtn urine-sln  Encounter for therapeutic drug  level monitoring  Subacute lumbar radiculopathy (left side) (S1 dermatome) -     MR Lumbar Spine Wo Contrast; Future     Patient Instructions  Pain Management Discharge Instructions  General Discharge Instructions :  If you need to reach your doctor call: Monday-Friday 8:00 am - 4:00 pm at [phone] or toll free [phone].  After clinic hours [phone] to have operator reach doctor.  Bring all of your medication bottles to all your appointments in the pain clinic.  To cancel or reschedule your appointment with Pain Management please remember to call 24 hours in advance to avoid a fee.  Refer to the educational materials which you have been given on: General Risks, I had my Procedure. Discharge Instructions, Post Sedation.  Post Procedure Instructions:  The drugs you were given will stay in your system until tomorrow, so for the next 24 hours you should not drive, make any legal decisions or drink any alcoholic beverages.  You may eat anything you prefer, but it is better to start with liquids then soups and crackers, and gradually work up to solid foods.  Please notify your doctor immediately if you have any unusual bleeding, trouble breathing or pain that is not related to your normal pain.  Depending on the type of procedure that was done, some parts of your body may feel week and/or numb.  This usually clears up by tonight or the next day.  Walk with the use of an assistive device or accompanied by an adult for the 24 hours.  You may use ice on the affected area for the first 24 hours.  Put ice in a Ziploc bag and cover with a towel and place against area 15 minutes on 15 minutes off.  You may switch to heat after 24 hours.   Medications discontinued today:  Medications Discontinued During This Encounter  Medication Reason  . ciprofloxacin (CIPRO) 500 MG tablet Error  . Oxycodone HCl 10 MG TABS Error  . oxyCODONE-acetaminophen (PERCOCET) 10-325 MG tablet Reorder    Medications administered today:  Lori Mckenzie had no medications administered during this visit.  Primary Care Physician: Ardine Eng, MD Location: [facility] Note by: Kathlen Brunswick Dossie Arbour, M.D, DABA, DABAPM, DABPM, DABIPP, FIPP

## 2014-12-10 NOTE — Progress Notes (Signed)
Safety precautions to be maintained throughout the outpatient stay will include: orient to surroundings, keep bed in low position, maintain call bell within reach at all times, provide assistance with transfer out of bed and ambulation. Pill count:  States she is out of pain pills

## 2014-12-10 NOTE — Patient Instructions (Signed)

## 2015-01-01 ENCOUNTER — Telehealth: Payer: Self-pay

## 2015-01-01 NOTE — Telephone Encounter (Signed)
Patient called in and she is requesting her office notes from where the drain was pulled and when she saw Dr. Pat Patrick last. Upon looking into the chart, Office Visit notes on 06/23/14, 08/21/14, 08/25/14, and 09/02/14 have been printed and are at the front desk in [place] at this time waiting for patient to pick them up.  Patient was notified that records are ready and that she would need to show ID and sign a release of information form prior to records being released by our office. Pt verbalizes understanding.

## 2015-01-07 ENCOUNTER — Other Ambulatory Visit: Payer: Self-pay | Admitting: Gastroenterology

## 2015-01-07 DIAGNOSIS — K7460 Unspecified cirrhosis of liver: Secondary | ICD-10-CM

## 2015-01-08 ENCOUNTER — Encounter: Payer: Self-pay | Admitting: Pain Medicine

## 2015-01-08 ENCOUNTER — Ambulatory Visit (HOSPITAL_BASED_OUTPATIENT_CLINIC_OR_DEPARTMENT_OTHER): Payer: Medicaid Other | Admitting: Pain Medicine

## 2015-01-08 ENCOUNTER — Other Ambulatory Visit: Payer: Self-pay | Admitting: Pain Medicine

## 2015-01-08 ENCOUNTER — Other Ambulatory Visit
Admission: RE | Admit: 2015-01-08 | Discharge: 2015-01-08 | Disposition: A | Discharge status: Home / Self Care | Payer: Medicaid Other | Source: Ambulatory Visit | Attending: Pain Medicine | Admitting: Pain Medicine

## 2015-01-08 VITALS — BP 138/74 | HR 106 | Temp 97.6°F | Resp 18 | Ht 62.0 in | Wt 211.0 lb

## 2015-01-08 DIAGNOSIS — M545 Low back pain, unspecified: Secondary | ICD-10-CM

## 2015-01-08 DIAGNOSIS — Z79899 Other long term (current) drug therapy: Secondary | ICD-10-CM | POA: Diagnosis not present

## 2015-01-08 DIAGNOSIS — F1190 Opioid use, unspecified, uncomplicated: Secondary | ICD-10-CM

## 2015-01-08 DIAGNOSIS — G8929 Other chronic pain: Secondary | ICD-10-CM | POA: Insufficient documentation

## 2015-01-08 DIAGNOSIS — E559 Vitamin D deficiency, unspecified: Secondary | ICD-10-CM

## 2015-01-08 DIAGNOSIS — Z5181 Encounter for therapeutic drug level monitoring: Secondary | ICD-10-CM | POA: Insufficient documentation

## 2015-01-08 DIAGNOSIS — E538 Deficiency of other specified B group vitamins: Secondary | ICD-10-CM

## 2015-01-08 DIAGNOSIS — Z79891 Long term (current) use of opiate analgesic: Secondary | ICD-10-CM | POA: Diagnosis not present

## 2015-01-08 LAB — COMPREHENSIVE METABOLIC PANEL
ALT: 46 U/L (ref 14–54)
AST: 38 U/L (ref 15–41)
Albumin: 4.6 g/dL (ref 3.5–5.0)
Alkaline Phosphatase: 87 U/L (ref 38–126)
Anion gap: 9 (ref 5–15)
BUN: 11 mg/dL (ref 6–20)
CO2: 31 mmol/L (ref 22–32)
Calcium: 9.3 mg/dL (ref 8.9–10.3)
Chloride: 102 mmol/L (ref 101–111)
Creatinine, Ser: 0.61 mg/dL (ref 0.44–1.00)
GFR calc Af Amer: >60 mL/min (ref >60)
GFR calc non Af Amer: >60 mL/min (ref >60)
Glucose, Bld: 202 mg/dL — ABNORMAL HIGH (ref 65–99)
Potassium: 3.4 mmol/L — ABNORMAL LOW (ref 3.5–5.1)
Sodium: 142 mmol/L (ref 135–145)
Total Bilirubin: 0.7 mg/dL (ref 0.3–1.2)
Total Protein: 9 g/dL — ABNORMAL HIGH (ref 6.5–8.1)

## 2015-01-08 LAB — SEDIMENTATION RATE: Sed Rate: 35 mm/hr — ABNORMAL HIGH (ref 0–30)

## 2015-01-08 LAB — C-REACTIVE PROTEIN: CRP: 2.8 mg/dL — ABNORMAL HIGH (ref <1.0)

## 2015-01-08 LAB — VITAMIN B12: Vitamin B-12: 172 pg/mL — ABNORMAL LOW (ref 180–914)

## 2015-01-08 LAB — MAGNESIUM: Magnesium: 1.9 mg/dL (ref 1.7–2.4)

## 2015-01-08 LAB — FOLATE: Folate: 5.6 ng/mL — ABNORMAL LOW (ref >5.9)

## 2015-01-08 MED ORDER — OXYCODONE-ACETAMINOPHEN 10-325 MG PO TABS: 1.0000 | ORAL_TABLET | Freq: Every day | ORAL | Status: DC | PRN

## 2015-01-08 NOTE — Patient Instructions (Signed)

## 2015-01-08 NOTE — Progress Notes (Signed)
Safety precautions to be maintained throughout the outpatient stay will include: orient to surroundings, keep bed in low position, maintain call bell within reach at all times, provide assistance with transfer out of bed and ambulation.  

## 2015-01-09 ENCOUNTER — Ambulatory Visit: Payer: Medicaid Other

## 2015-01-09 LAB — VITAMIN D PNL(25-HYDRXY+1,25-DIHY)-BLD
Vit D, 1,25-Dihydroxy: 28.8 pg/mL (ref 19.9–79.3)
Vit D, 25-Hydroxy: 14.3 ng/mL — ABNORMAL LOW (ref 30.0–100.0)

## 2015-01-09 IMAGING — CT CT ANGIO CHEST
2 of 4 series · 17 of 36 positions shown · IV contrast (APPLIED)
Comparison: None.

CLINICAL DATA: Hypoxia, chest pain

EXAM:
CT ANGIOGRAPHY CHEST WITH CONTRAST
TECHNIQUE: Multidetector CT imaging of the chest was performed using the
standard protocol during bolus administration of intravenous
contrast. Multiplanar CT image reconstructions and MIPs were
obtained to evaluate the vascular anatomy.
CONTRAST:  100 mL Isovue 370

[Series 5: pe 1.0 thins · axial · 0.79mm/px · z∈[-783,-543]mm · 14 of 272 slices shown]
[im 16/272  lung]
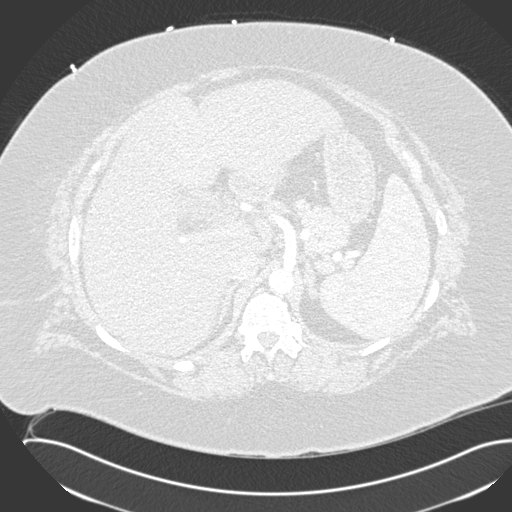
[im 31/272  mediastinal]
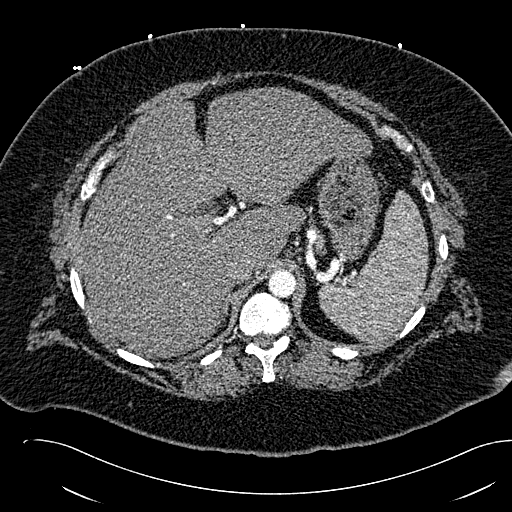
[im 61/272  lung]
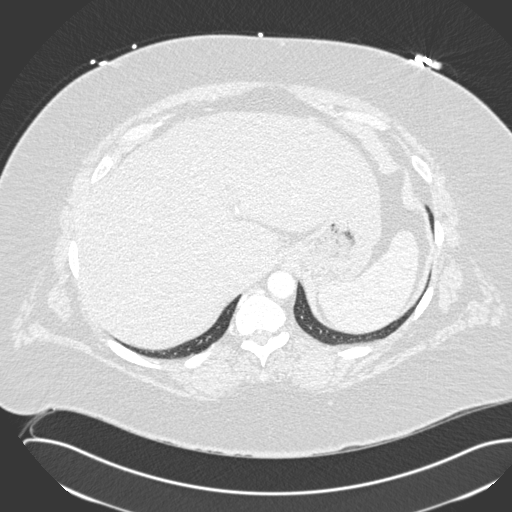
[im 76/272  mediastinal]
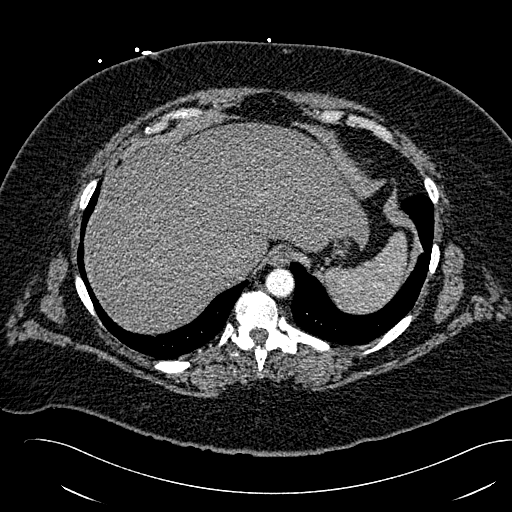
[im 91/272  lung]
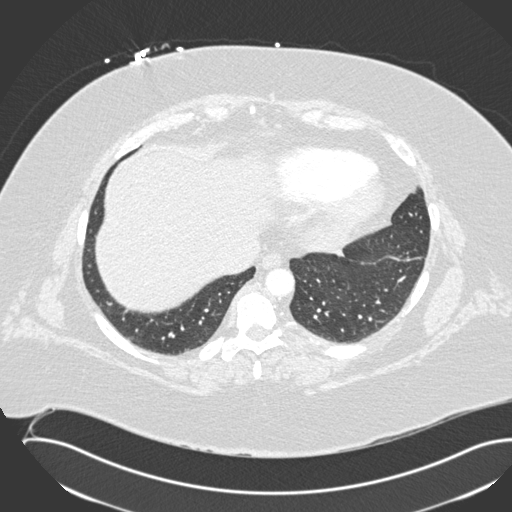
[im 106/272  mediastinal]
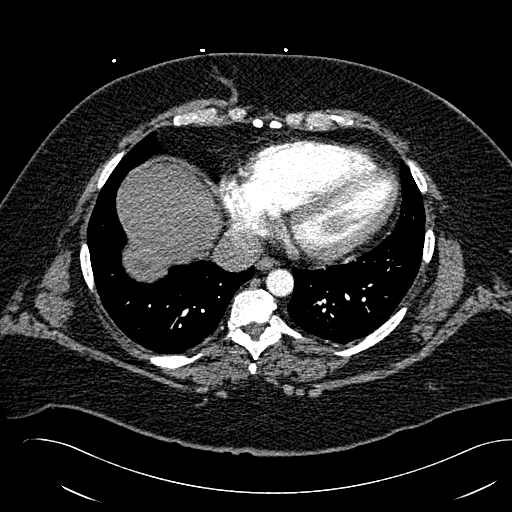
[im 121/272  lung]
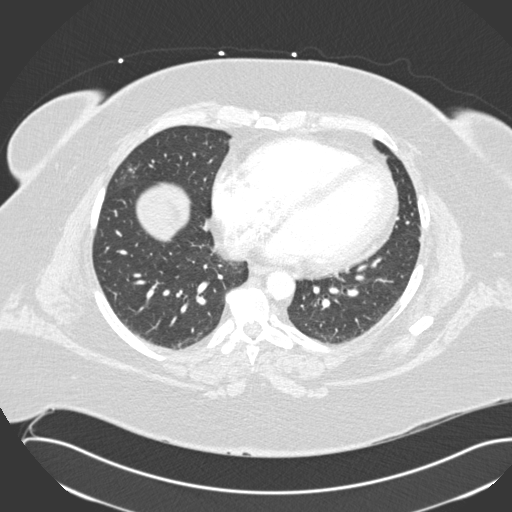
[im 151/272  mediastinal]
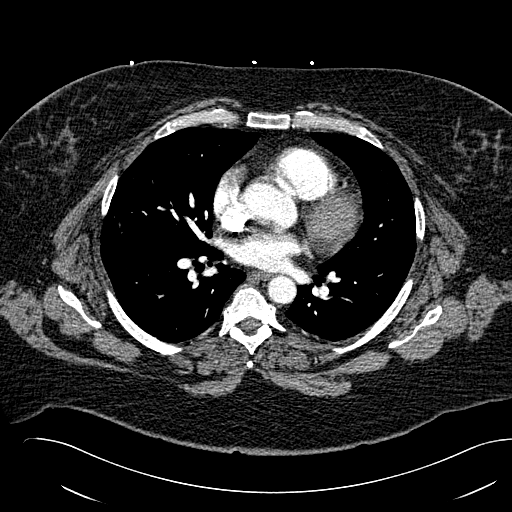
[im 166/272  lung]
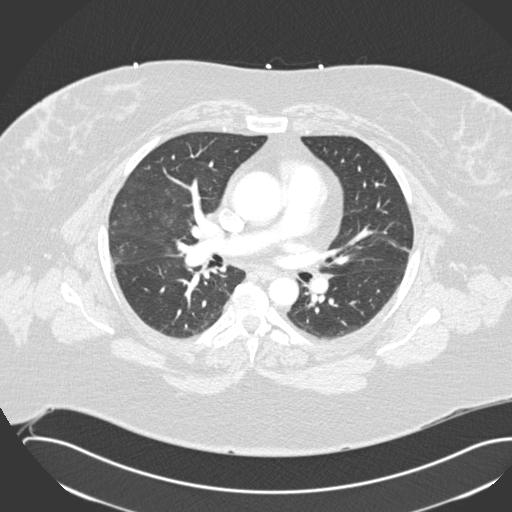
[im 181/272  mediastinal]
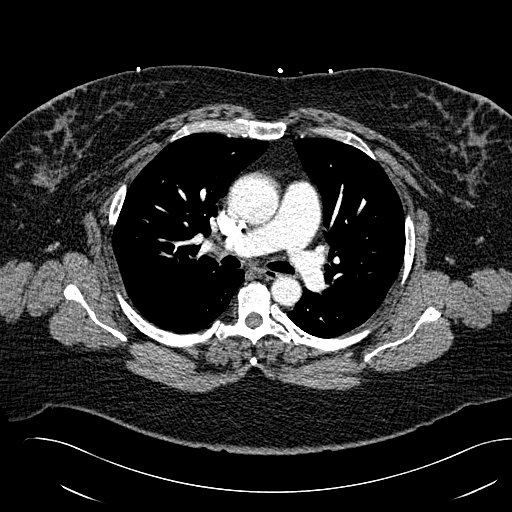
[im 196/272  lung]
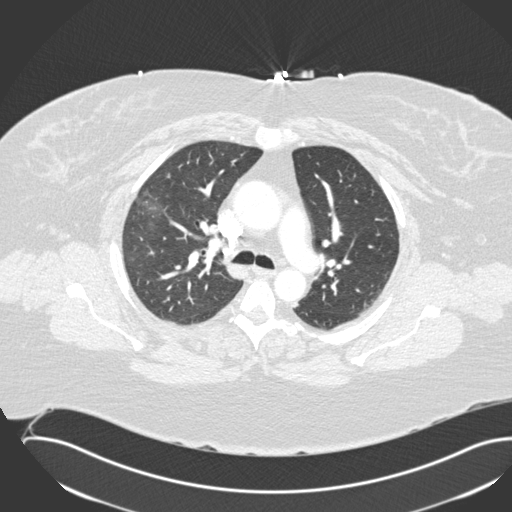
[im 211/272  mediastinal]
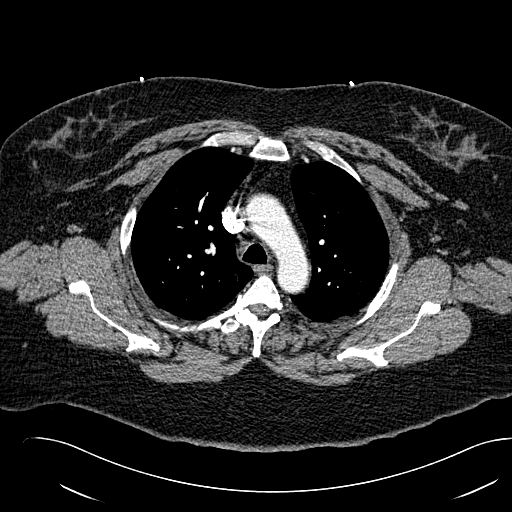
[im 241/272  lung]
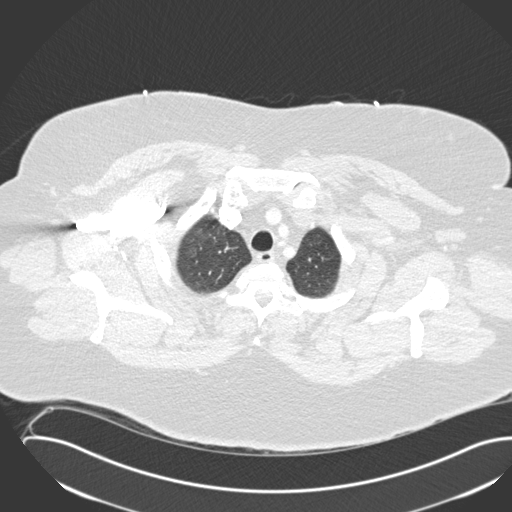
[im 256/272  mediastinal]
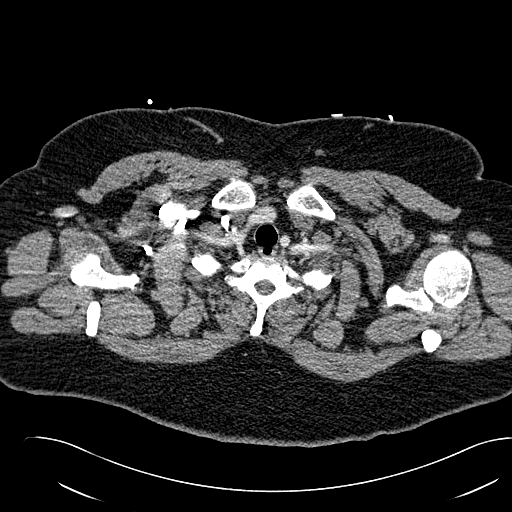

[Series 7: cor pe 2.0 mpr · coronal · 0.56mm/px · 3 of 176 slices shown]
[im 36/176  mediastinal]
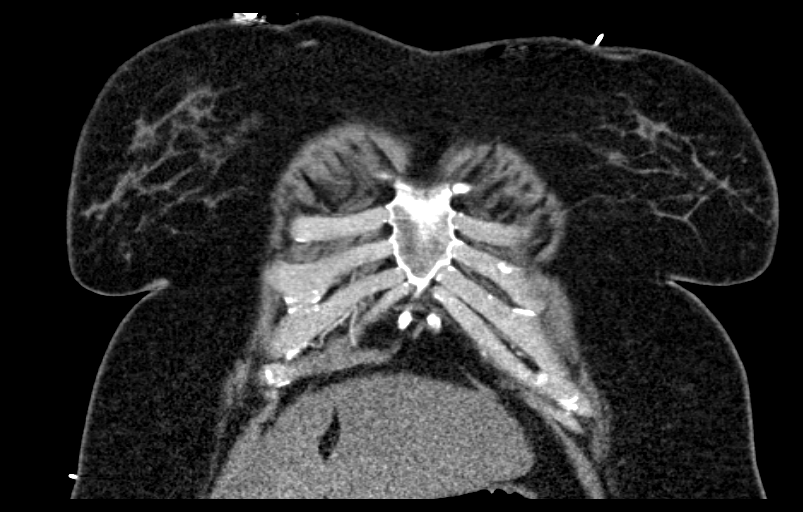
[im 71/176  mediastinal]
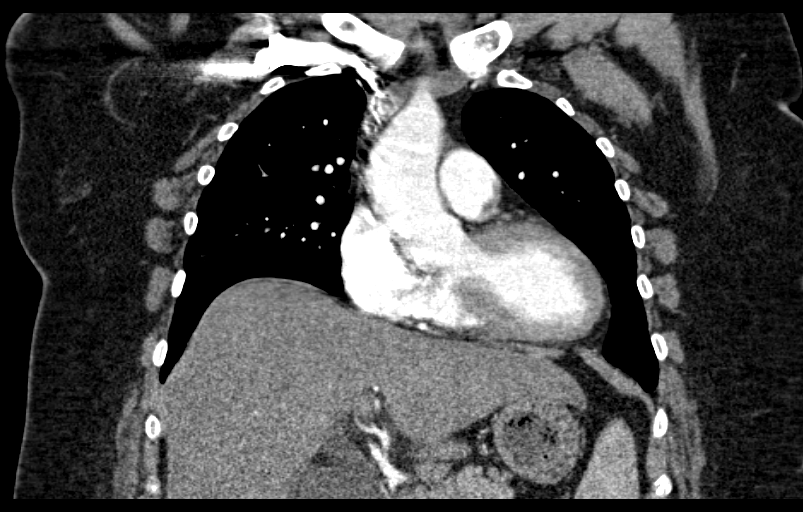
[im 106/176  mediastinal]
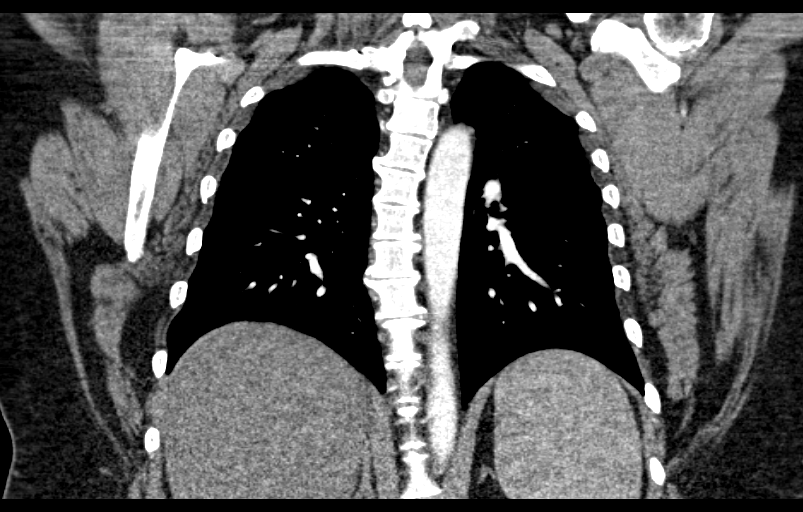

[17 of 36 positions shown; findings below may reference images not displayed]

FINDINGS: The central airways are patent. There is peripheral reticular
nodular interstitial disease with a tree-in-bud appearance involving
the right middle lobe and right upper lobe. There are patchy
ground-glass opacities in the right upper lobe. There is no focal
consolidation. There is no pleural effusion or pneumothorax.

There are no pathologically enlarged axillary, hilar or mediastinal
lymph nodes.

The heart size is normal. There is no pericardial effusion. The
thoracic aorta is normal in caliber.

Review of bone windows demonstrates no focal lytic or sclerotic
lesions. There is a calcified right paracentral disc protrusion at
T7-8.

Limited non-contrast images of the upper abdomen were obtained. The
adrenal glands appear normal. The remainder of the visualized
abdominal organs are unremarkable.

Review of the MIP images confirms the above findings.
IMPRESSION: 1. No evidence of pulmonary embolus.
2. Peripheral reticular nodular interstitial disease with a
tree-in-bud appearance involving the right upper lobe and right
middle lobe with patchy ground-glass opacities in the right upper
lobe. The overall appearance can be seen with infectious or
inflammatory etiology including atypical infection such as EWAN.

## 2015-01-09 IMAGING — CR DG CHEST 2V
2 series · 2 of 2 positions shown · non-contrast
Comparison: 05/07/2013

CLINICAL DATA: Shortness of breath, history of asthma and COPD

EXAM:
CHEST  2 VIEW

[chest lat]
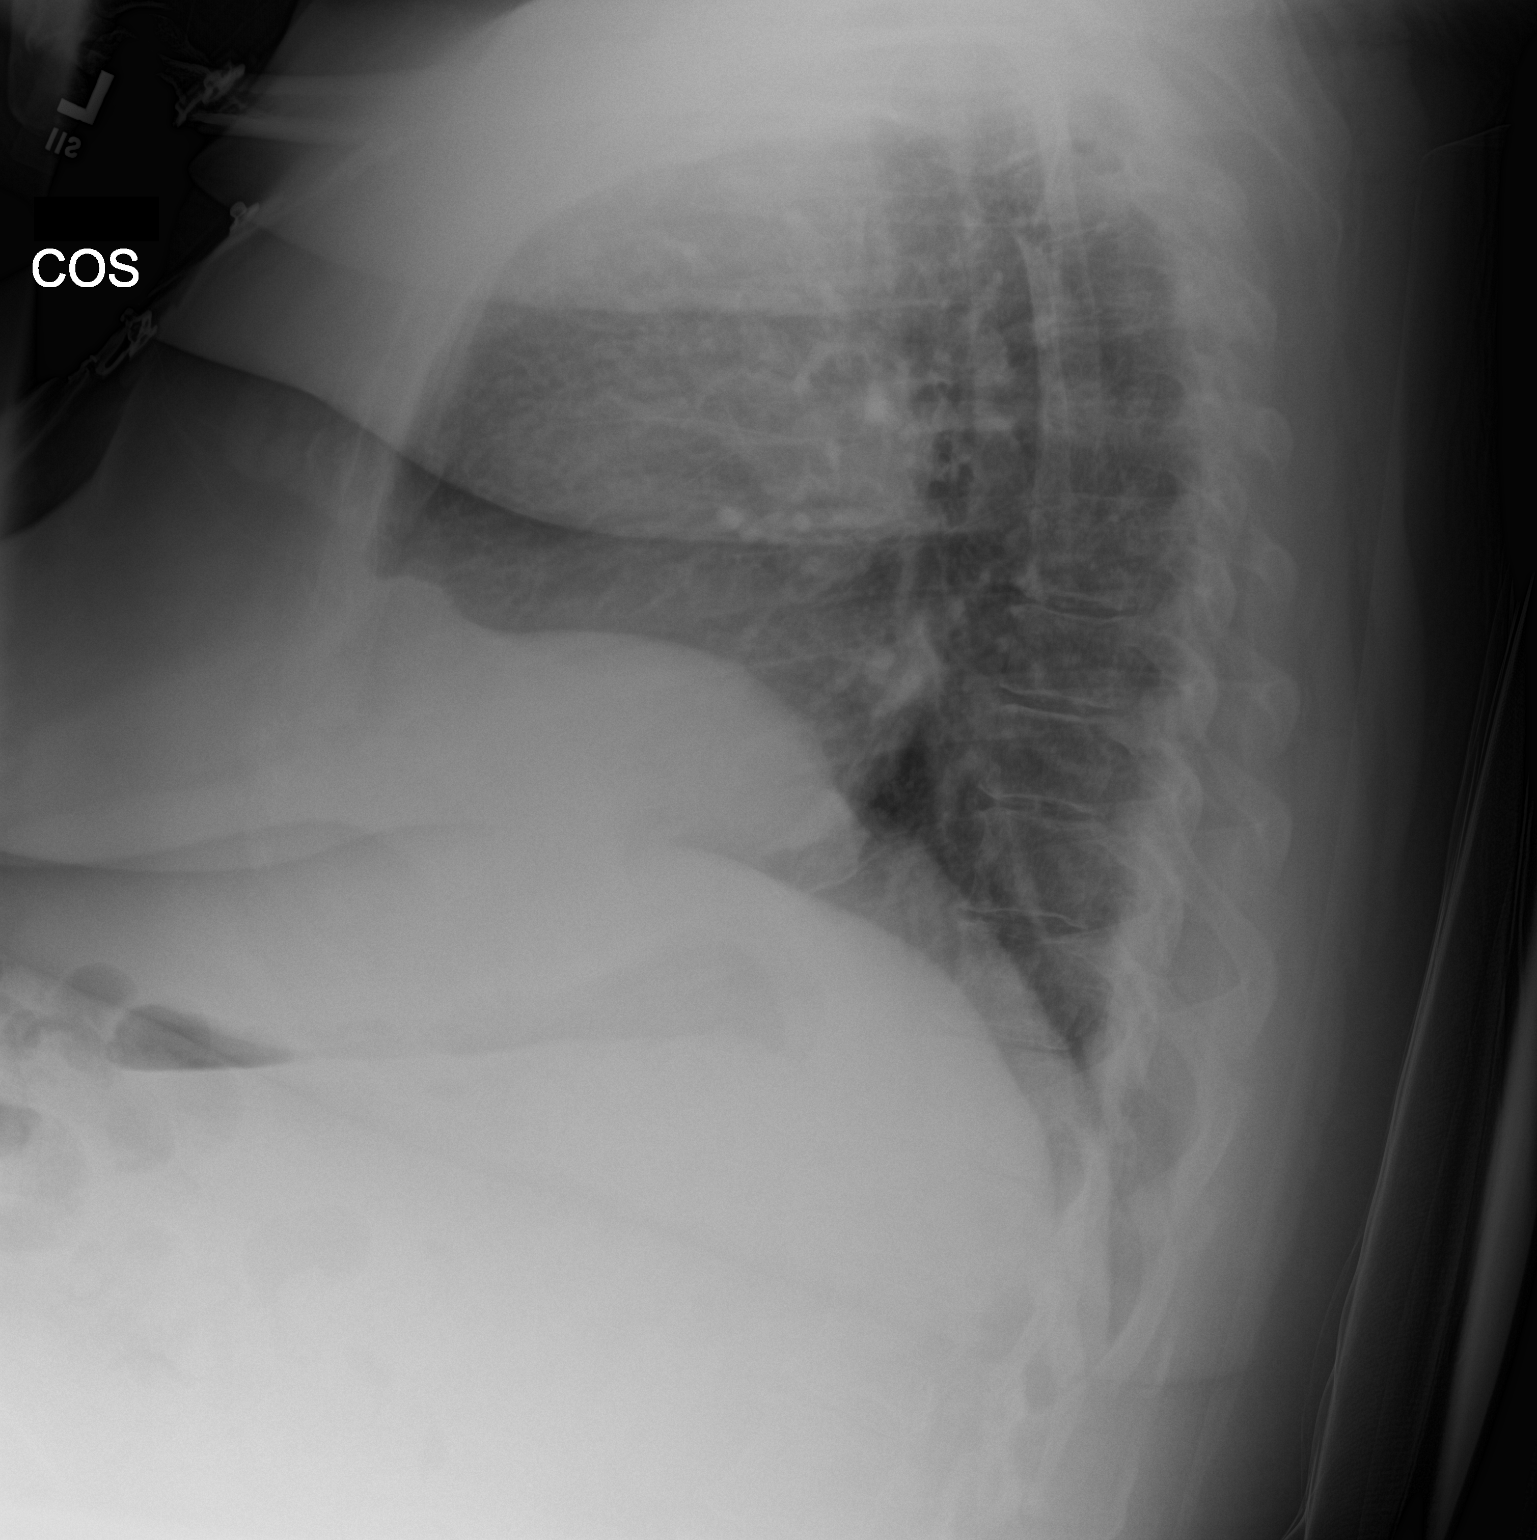

[chest ap]
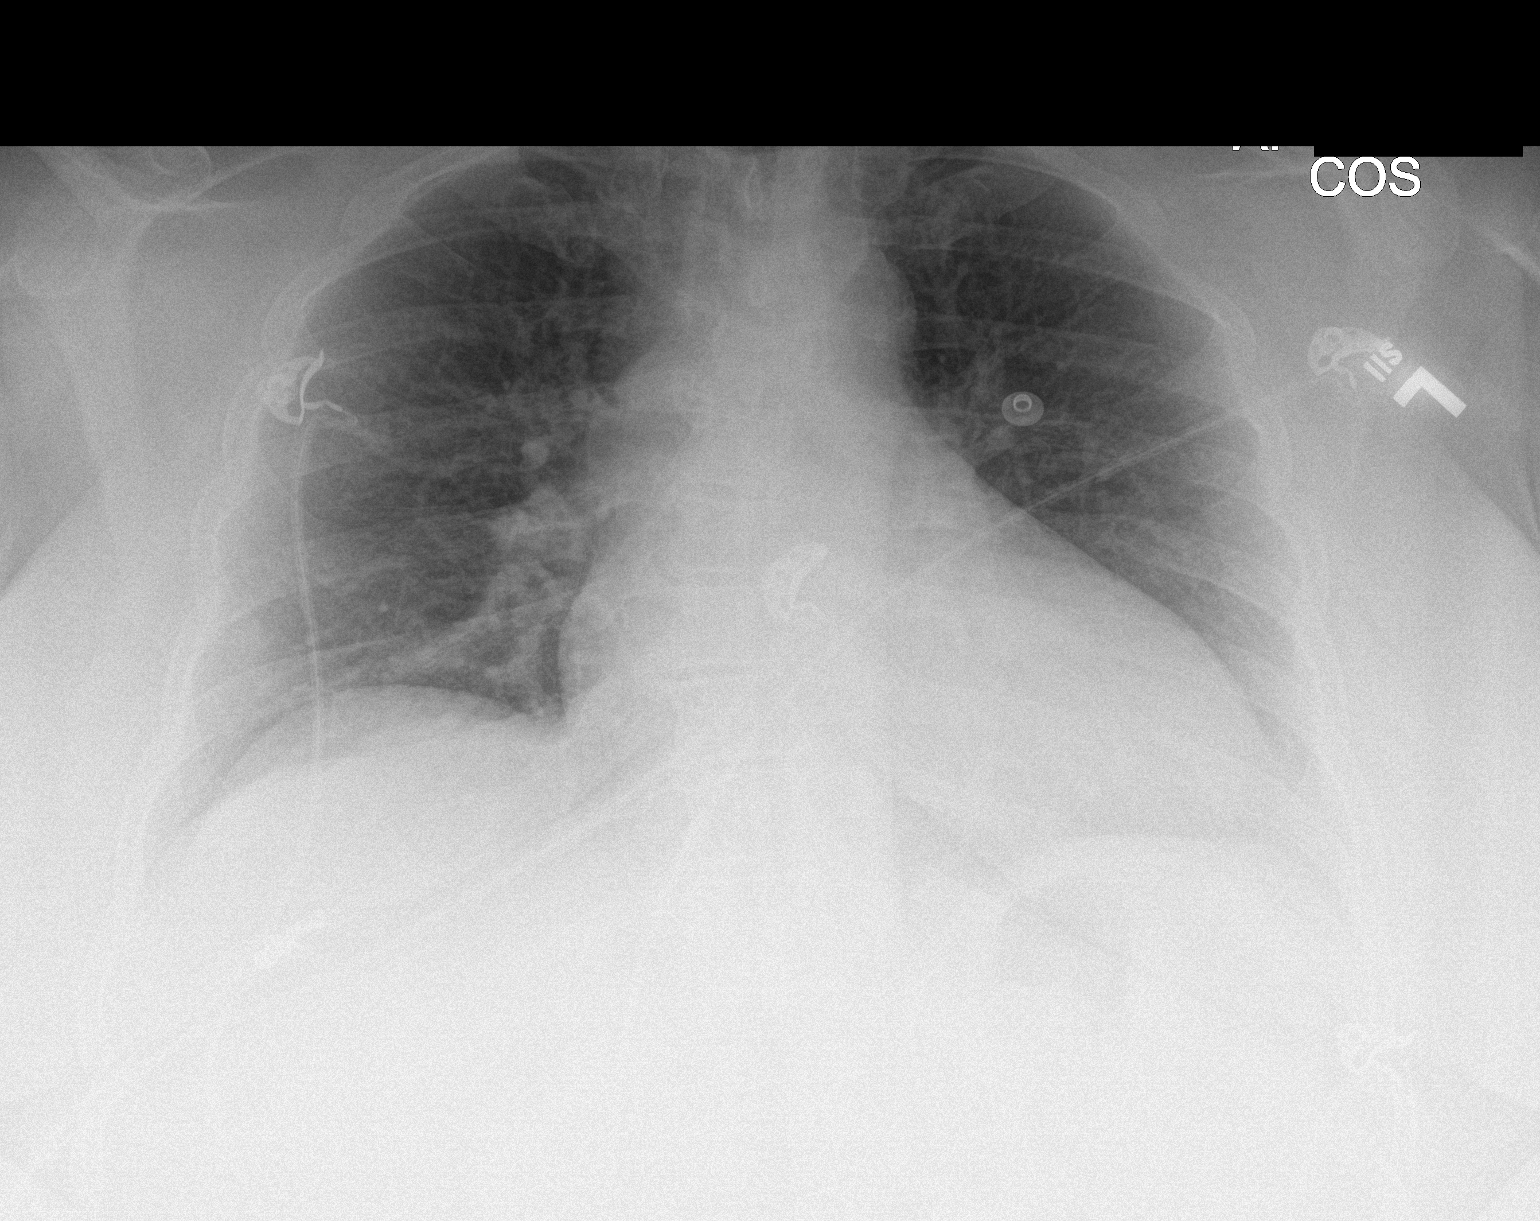

[2 of 2 positions shown; findings below may reference images not displayed]

FINDINGS: Limited inspiratory effect. Mild cardiac enlargement similar to
prior study allowing for this. Vascular pattern normal. Lungs clear.
No effusions.
IMPRESSION: No acute findings.  Stable mild cardiac enlargement.

## 2015-01-12 NOTE — Progress Notes (Signed)
Patient's Name: Lori Mckenzie MRN: [id number] DOB: 1965-05-18 DOS: 01/08/2015  Primary Reason(s) for Visit: Encounter for Medication Management CC: Pelvic Pain and Hip Pain   HPI:    [name] is a 50 y.o. year old, female patient, who returns today as an established patient. She has Type 2 diabetes mellitus ([place]); COPD (chronic obstructive pulmonary disease) (HCC); HTN (hypertension); GERD (gastroesophageal reflux disease); OSA on CPAP; Anxiety; Abdominal pain; Steatohepatitis; Bile leak, postoperative; Dysthymia; Other social stressor; Abdominal abscess ([place]); Ascites; NASH (nonalcoholic steatohepatitis); Hypokalemia; Diffuse abdominal pain; Pyrexia; Chronic pain; Opiate use; Long term prescription opiate use; Long term current use of opiate analgesic; Encounter for therapeutic drug level monitoring; Abdominal pain, chronic, epigastric; Chronic low back pain; Chronic neck pain; Bilateral lower extremity pain; Breath shortness; Diastolic dysfunction; Clinical depression; Chronic obstructive pulmonary disease ([place]); Airway hyperreactivity; Anxiety state; Acid reflux; Essential (primary) hypertension; Lumbar canal stenosis; Lumbar and sacral osteoarthritis; Myofascial pain; Sleep apnea; Cervical disc disease; Fall; Drowsiness; Episode of syncope; and Subacute lumbar radiculopathy (left side) (S1 dermatome) on her problem list.. Her primarily concern today is the Pelvic Pain and Hip Pain     The patient comes into clinic today for pharmacological management of her chronic pain.  Today's Pain Score: 8 , clinically she looks like a 3-4/10. Reported level of pain is incompatible with clinical obrservations. This may be secondary to a possible lack of understanding on how the pain scale works. Pain Type: Chronic pain Pain Location: Coccyx Pain Orientation: Mid Pain Descriptors / Indicators: Burning, Aching, Discomfort, Stabbing Pain Frequency: Constant  Date of Last Visit: 12/10/14 Service Provided on Last  Visit: Med Refill  Pharmacotherapy Review:   Side-effects or Adverse reactions: None reported Effectiveness: Described as relatively effective, allowing for increase in activities of daily living (ADL) Onset of action: Within expected pharmacological parameters Duration of action: Within normal limits for medication Peak effect: Timing and results are as within normal expected parameters [place] PMP: Compliant with practice rules and regulations UDS Results: The patient's last UDS done on 10/28/2014 came back with results, as expected. The patient remains compliant UDS Interpretation: Patient appears to be compliant with practice rules and regulations Medication Assessment Form: Reviewed. Patient indicates being compliant with therapy Treatment compliance: Compliant Substance Use Disorder (SUD) Risk Level: Low Pharmacologic Plan: Continue therapy as is  Lab Work: Illicit Drugs Lab Results  Component Value Date   THCU NEGATIVE 08/26/2013   PCPSCRNUR NEGATIVE 08/26/2013   MDMA NEGATIVE 08/26/2013   AMPHETMU NEGATIVE 08/26/2013   METHADONE NEGATIVE 08/26/2013   ETOH < 3 10/18/2011    Inflammation Markers Lab Results  Component Value Date   ESRSEDRATE 35* 01/08/2015   CRP 2.8* 01/08/2015    Renal Function Lab Results  Component Value Date   BUN 11 01/08/2015   CREATININE 0.61 01/08/2015   GFRAA >60 01/08/2015   GFRNONAA >60 01/08/2015    Hepatic Function Lab Results  Component Value Date   AST 38 01/08/2015   ALT 46 01/08/2015   ALBUMIN 4.6 01/08/2015    Electrolytes Lab Results  Component Value Date   NA 142 01/08/2015   K 3.4* 01/08/2015   CL 102 01/08/2015   CALCIUM 9.3 01/08/2015   MG 1.9 01/08/2015    Allergies:  [name] is allergic to tape and vicodin.  Meds:  The patient has a current medication list which includes the following prescription(s): albuterol, alprazolam, citalopram, fluticasone, furosemide, gabapentin, loperamide, nystatin, nystatin,  ondansetron, oxycodone-acetaminophen, promethazine, rifaximin, rizatriptan, simethicone, spironolactone, and famotidine. Requested  Prescriptions   Signed Prescriptions Disp Refills  . oxyCODONE-acetaminophen (PERCOCET) 10-325 MG tablet 120 tablet 0    Sig: Take 1 tablet by mouth 5 (five) times daily as needed for pain. Do not take for 11 hours after taking Xanax. Max.: 5/day.    ROS:  Constitutional: Afebrile, no chills, well hydrated and well nourished Gastrointestinal: negative Musculoskeletal:negative Neurological: negative Behavioral/Psych: negative  PFSH:  Medical:  [name]  has a past medical history of Brittle bone disease; Asthma; Thyroid disease; Hypertension; Back pain; Diabetes mellitus without complication ([place]); Chronic kidney disease; Anxiety; GERD (gastroesophageal reflux disease); Sleep apnea; COPD (chronic obstructive pulmonary disease) ([place]); Hypothyroidism; Cervical disc disease; TIA (transient ischemic attack); Cancer ([place]); Collagen vascular disease ([place]); Ascites; NASH (nonalcoholic steatohepatitis); NASH (nonalcoholic steatohepatitis); and Sleep apnea. Family: family history includes Heart disease in her brother and sister; Lung cancer in her father; Ulcers in her father and sister. Surgical:  has past surgical history that includes Abdominal hysterectomy; Tubal ligation; Colonoscopy with propofol (N/A, 06/23/2014); Esophagogastroduodenoscopy (N/A, 06/23/2014); Cholecystectomy (N/A, 07/15/2014); ERCP (N/A, 07/16/2014); Wisdom tooth extraction; and ERCP (N/A, 10/03/2014). Tobacco:  reports that she has never smoked. She has never used smokeless tobacco. Alcohol:  reports that she does not drink alcohol. Drug:  reports that she does not use illicit drugs.  Physical Exam:  Vitals:  Today's Vitals   01/08/15 1310 01/08/15 1314  BP: 138/74   Pulse: 106   Temp: 97.6 F (36.4 C)   TempSrc: Oral   Resp: 18   Height: 5' 2"  (1.575 m)   Weight: 211 lb (95.709 kg)   SpO2:  100%   PainSc:  8   Calculated BMI: Body mass index is 38.58 kg/(m^2). General appearance: alert, cooperative, appears stated age and no distress Eyes: PERLA Respiratory: No evidence respiratory distress, no audible rales or ronchi and no use of accessory muscles of respiration Neck: no adenopathy, no carotid bruit, no JVD, supple, symmetrical, trachea midline and thyroid not enlarged, symmetric, no tenderness/mass/nodules   Assessment:  Encounter Diagnosis:  Primary Diagnosis: Chronic pain [G89.29]  Plan:   Interventional Therapies: None at this point.    Jaonna was seen today for pelvic pain and hip pain.  Diagnoses and all orders for this visit:  Chronic pain -     oxyCODONE-acetaminophen (PERCOCET) 10-325 MG tablet; Take 1 tablet by mouth 5 (five) times daily as needed for pain. Do not take for 11 hours after taking Xanax. Max.: 5/day.  Chronic low back pain  Long term current use of opiate analgesic  Long term prescription opiate use  Opiate use  Encounter for therapeutic drug level monitoring     Patient Instructions  Pain Management Discharge Instructions  General Discharge Instructions :  If you need to reach your doctor call: Monday-Friday 8:00 am - 4:00 pm at [phone] or toll free [phone].  After clinic hours [phone] to have operator reach doctor.  Bring all of your medication bottles to all your appointments in the pain clinic.  To cancel or reschedule your appointment with Pain Management please remember to call 24 hours in advance to avoid a fee.  Refer to the educational materials which you have been given on: General Risks, I had my Procedure. Discharge Instructions, Post Sedation.  Post Procedure Instructions:  The drugs you were given will stay in your system until tomorrow, so for the next 24 hours you should not drive, make any legal decisions or drink any alcoholic beverages.  You may eat anything you prefer, but  it is better to  start with liquids then soups and crackers, and gradually work up to solid foods.  Please notify your doctor immediately if you have any unusual bleeding, trouble breathing or pain that is not related to your normal pain.  Depending on the type of procedure that was done, some parts of your body may feel week and/or numb.  This usually clears up by tonight or the next day.  Walk with the use of an assistive device or accompanied by an adult for the 24 hours.  You may use ice on the affected area for the first 24 hours.  Put ice in a Ziploc bag and cover with a towel and place against area 15 minutes on 15 minutes off.  You may switch to heat after 24 hours.  Medications discontinued today:  Medications Discontinued During This Encounter  Medication Reason  . clotrimazole (MYCELEX) 10 MG troche Error  . oxyCODONE-acetaminophen (PERCOCET) 10-325 MG tablet Reorder   Medications administered today:  [name] had no medications administered during this visit.  Primary Care Physician: Ardine Eng, MD Location: [facility] Note by: Kathlen Brunswick Dossie Arbour, M.D, DABA, DABAPM, DABPM, DABIPP, FIPP

## 2015-01-13 ENCOUNTER — Other Ambulatory Visit: Payer: Medicaid Other | Admitting: Pain Medicine

## 2015-01-13 DIAGNOSIS — E559 Vitamin D deficiency, unspecified: Secondary | ICD-10-CM

## 2015-01-13 DIAGNOSIS — G8929 Other chronic pain: Secondary | ICD-10-CM

## 2015-01-13 DIAGNOSIS — E538 Deficiency of other specified B group vitamins: Secondary | ICD-10-CM

## 2015-01-13 MED ORDER — INTRINSI B12-FOLATE 800-500-20 MCG-MCG-MG PO TABS: 500.0000 ug | ORAL_TABLET | Freq: Every day | ORAL | Status: DC

## 2015-01-13 MED ORDER — CYANOCOBALAMIN 2000 MCG PO TABS: 2000.0000 ug | ORAL_TABLET | Freq: Every day | ORAL | Status: DC

## 2015-01-13 MED ORDER — VITAMIN D3 50 MCG (2000 UT) PO CAPS: 2000.0000 [IU] | ORAL_CAPSULE | Freq: Every day | ORAL | Status: DC

## 2015-01-13 MED ORDER — VITAMIN D (ERGOCALCIFEROL) 1.25 MG (50000 UNIT) PO CAPS: 50000.0000 [IU] | ORAL_CAPSULE | ORAL | Status: AC

## 2015-01-14 ENCOUNTER — Encounter: Payer: Self-pay | Admitting: Internal Medicine

## 2015-01-14 ENCOUNTER — Inpatient Hospital Stay: Payer: Medicaid Other | Attending: Internal Medicine | Admitting: Internal Medicine

## 2015-01-14 ENCOUNTER — Inpatient Hospital Stay: Payer: Medicaid Other

## 2015-01-14 DIAGNOSIS — M549 Dorsalgia, unspecified: Secondary | ICD-10-CM | POA: Diagnosis not present

## 2015-01-14 DIAGNOSIS — E538 Deficiency of other specified B group vitamins: Secondary | ICD-10-CM | POA: Diagnosis not present

## 2015-01-14 DIAGNOSIS — E039 Hypothyroidism, unspecified: Secondary | ICD-10-CM | POA: Insufficient documentation

## 2015-01-14 DIAGNOSIS — D696 Thrombocytopenia, unspecified: Secondary | ICD-10-CM | POA: Diagnosis not present

## 2015-01-14 DIAGNOSIS — Z800 Family history of malignant neoplasm of digestive organs: Secondary | ICD-10-CM | POA: Diagnosis not present

## 2015-01-14 DIAGNOSIS — F419 Anxiety disorder, unspecified: Secondary | ICD-10-CM | POA: Insufficient documentation

## 2015-01-14 DIAGNOSIS — J449 Chronic obstructive pulmonary disease, unspecified: Secondary | ICD-10-CM | POA: Diagnosis not present

## 2015-01-14 DIAGNOSIS — E119 Type 2 diabetes mellitus without complications: Secondary | ICD-10-CM | POA: Insufficient documentation

## 2015-01-14 DIAGNOSIS — Z79899 Other long term (current) drug therapy: Secondary | ICD-10-CM | POA: Diagnosis not present

## 2015-01-14 DIAGNOSIS — Z8542 Personal history of malignant neoplasm of other parts of uterus: Secondary | ICD-10-CM | POA: Insufficient documentation

## 2015-01-14 DIAGNOSIS — K219 Gastro-esophageal reflux disease without esophagitis: Secondary | ICD-10-CM | POA: Insufficient documentation

## 2015-01-14 DIAGNOSIS — J45909 Unspecified asthma, uncomplicated: Secondary | ICD-10-CM | POA: Diagnosis not present

## 2015-01-14 DIAGNOSIS — N189 Chronic kidney disease, unspecified: Secondary | ICD-10-CM | POA: Insufficient documentation

## 2015-01-14 DIAGNOSIS — K7581 Nonalcoholic steatohepatitis (NASH): Secondary | ICD-10-CM | POA: Diagnosis not present

## 2015-01-14 DIAGNOSIS — I129 Hypertensive chronic kidney disease with stage 1 through stage 4 chronic kidney disease, or unspecified chronic kidney disease: Secondary | ICD-10-CM | POA: Diagnosis not present

## 2015-01-14 DIAGNOSIS — Z8673 Personal history of transient ischemic attack (TIA), and cerebral infarction without residual deficits: Secondary | ICD-10-CM | POA: Diagnosis not present

## 2015-01-14 DIAGNOSIS — G4730 Sleep apnea, unspecified: Secondary | ICD-10-CM | POA: Diagnosis not present

## 2015-01-14 DIAGNOSIS — R188 Other ascites: Secondary | ICD-10-CM | POA: Insufficient documentation

## 2015-01-14 DIAGNOSIS — K7460 Unspecified cirrhosis of liver: Secondary | ICD-10-CM | POA: Diagnosis present

## 2015-01-14 DIAGNOSIS — I428 Other cardiomyopathies: Secondary | ICD-10-CM | POA: Diagnosis not present

## 2015-01-14 NOTE — Progress Notes (Signed)
Pt has lots of pain in multiple areas.  She also had gallbladder removed and per pt her liver was nicked as well as bile duct, later after building up 2 1/2 gallons of fluid she had to have ERCP with stent and drain placed for about 4 months and then it was removed because Dr. Candace Cruise told her it was healed. 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 She states she finished up atb about week ago, no fevers since that.  She and her husband feel like they have cold sx and it is worse in am

## 2015-01-14 NOTE — Progress Notes (Signed)
[place] @ [facility] [phone]) [phone]  Fax:([phone]) [place] OB: May 01, 1965  MR#: [id number]  GGE#:[id number]  Patient Care Team: Ardine Eng, MD as PCP - General (Family Medicine) Adrian Prows, MD as Consulting Physician (Infectious Diseases)  CHIEF COMPLAINT: No chief complaint on file.    No history exists.   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  Oncology Flowsheet 10/03/2014 10/04/2014 10/04/2014 10/04/2014 10/05/2014 10/05/2014 10/06/2014  ALPRAZolam (XANAX) PO - 1 mg     1 mg   1 mg  dexamethasone (DECADRON) IJ - - - - - - -  diazepam (VALIUM) PO - - - - - - -  diphenhydrAMINE (BENADRYL) PO - - - - - - -  enoxaparin (LOVENOX) [place] - - - - - - -  ondansetron (ZOFRAN) IJ - -     - - -  ondansetron (ZOFRAN) IV - 4 mg 4 mg 4 mg 4 mg 4 mg 4 mg  ondansetron (ZOFRAN) PO - - - - - - -  prochlorperazine (COMPAZINE) PO - - - - - - -    HISTORY OF PRESENT ILLNESS:   Lori Mckenzie is a 50 year old Caucasian female with a very extensive medical history, most recently with liver cirrhosis secondary to NASH, who had complicated post surgical course after a cholecystectomy in July 2016. The course was complicated by traumatic internal hemorrhage, spontaneous bacterial peritonitis, biloma, prolonged hospitalization to [facility] with fevers of unknown origin. At that time the developed thrombocytopenia and anemia, and multiple consultants were involved. 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 The patient eventually defervesced and has been without fevers since October 2016. Patient has been on several courses of antibiotics for SBP prophylaxis. She claims that she has not been very compliant with diuretics or rifampin for prophylaxis of encephalopathy. She had an abdominal ultrasound in November 2016, which showed cirrhotic liver with splenomegaly of 20 cm. Most recently her white blood cell  count, hemoglobin, white blood cell count differential were within normal range, with the only abnormality detected on CBC being mild thrombocytopenia. Lori Mckenzie complains of persistent mid to low back pain, which is chronic, managed by pain specialists. She denies fevers, chills, night sweats, shortness of breath, cough, nausea, vomiting, diarrhea or constipation. She, however, claims that her short-term memory is poor. Last week she was found to have low vitamin B12 and folate levels. The treatment was initiated with oral supplementation by a pain specialist. She is referred to our clinic for a concern about [place] REVIEW OF SYSTEMS:   Review of Systems  All other systems reviewed and are negative.    PAST MEDICAL HISTORY: Past Medical History  Diagnosis Date  . Brittle bone disease   . Asthma   . Thyroid disease   . Hypertension   . Back pain   . Diabetes mellitus without complication ([place])   . Chronic kidney disease   . Anxiety   . GERD (gastroesophageal reflux disease)   . Sleep apnea   . COPD (chronic obstructive pulmonary disease) ([place])   . Hypothyroidism   . Cervical disc disease   . TIA (transient ischemic attack)   . Cancer ([place])     Uteriine  ca 74yr ago partial hysterectomy  . Collagen vascular disease ([place]     RA  3-4 yrs ago  . Ascites   . NASH (nonalcoholic steatohepatitis)   . NASH (nonalcoholic steatohepatitis)   . Sleep apnea  PAST SURGICAL HISTORY: Past Surgical History  Procedure Laterality Date  . Abdominal hysterectomy    . Tubal ligation    . Colonoscopy with propofol N/A 06/23/2014    Procedure: COLONOSCOPY WITH PROPOFOL;  Surgeon: Lollie Sails, MD;  Location: [facility] ENDOSCOPY;  Service: Endoscopy;  Laterality: N/A;  . Esophagogastroduodenoscopy N/A 06/23/2014    Procedure: ESOPHAGOGASTRODUODENOSCOPY (EGD);  Surgeon: Lollie Sails, MD;  Location: [facility] ENDOSCOPY;  Service: Endoscopy;  Laterality: N/A;  . Cholecystectomy N/A 07/15/2014     Procedure: LAPAROSCOPIC CHOLECYSTECTOMY with liver biopsy ;  Surgeon: Sherri Rad, MD;  Location: [facility] ORS;  Service: General;  Laterality: N/A;  . Ercp N/A 07/16/2014    Procedure: ENDOSCOPIC RETROGRADE CHOLANGIOPANCREATOGRAPHY (ERCP);  Surgeon: Clarene Essex, MD;  Location: [facility] ENDOSCOPY;  Service: Endoscopy;  Laterality: N/A;  . Wisdom tooth extraction    . Ercp N/A 10/03/2014    Procedure: ENDOSCOPIC RETROGRADE CHOLANGIOPANCREATOGRAPHY (ERCP);  Surgeon: Hulen Luster, MD;  Location: [facility] ENDOSCOPY;  Service: Gastroenterology;  Laterality: N/A;    FAMILY HISTORY Family History  Problem Relation Age of Onset  . Lung cancer Father   . Ulcers Father   . Heart disease Sister   . Ulcers Sister   . Heart disease Brother     ADVANCED DIRECTIVES:  No flowsheet data found.  HEALTH MAINTENANCE: Social History  Substance Use Topics  . Smoking status: Never Smoker   . Smokeless tobacco: Never Used  . Alcohol Use: No     Comment: occ     Allergies  Allergen Reactions  . Tape Swelling  . Vicodin [Hydrocodone-Acetaminophen] Itching and Rash    Current Outpatient Prescriptions  Medication Sig Dispense Refill  . albuterol (PROVENTIL HFA;VENTOLIN HFA) 108 (90 BASE) MCG/ACT inhaler Inhale 2 puffs into the lungs every 4 (four) hours as needed for wheezing or shortness of breath.    . ALPRAZolam (XANAX) 1 MG tablet Take 1 tablet (1 mg total) by mouth 3 (three) times daily as needed for anxiety. (Patient taking differently: Take 2 mg by mouth 3 (three) times daily as needed. ) 30 tablet 0  . Cholecalciferol (VITAMIN D3) 2000 units capsule Take 1 capsule (2,000 Units total) by mouth daily. 30 capsule PRN  . citalopram (CELEXA) 20 MG tablet Take 20 mg by mouth at bedtime.     . cyanocobalamin ([employer] VITAMIN B12) 2000 MCG tablet Take 1 tablet (2,000 mcg total) by mouth daily. 30 tablet PRN  . fluticasone (FLONASE) 50 MCG/ACT nasal spray Place 1 spray into both nostrils daily as needed for rhinitis.     Derald Macleod Factor (INTRINSI B12-FOLATE) 825-053-97 MCG-MCG-MG TABS Take 500 mcg by mouth daily. 30 each PRN  . furosemide (LASIX) 40 MG tablet Take 80 mg by mouth daily.     Marland Kitchen gabapentin (NEURONTIN) 300 MG capsule Take 600 mg by mouth 3 (three) times daily.     Marland Kitchen loperamide (IMODIUM) 2 MG capsule Take 1 capsule (2 mg total) by mouth every 6 (six) hours as needed for diarrhea or loose stools. 30 capsule 0  . nystatin (MYCOSTATIN) powder Apply 1 application topically 3 (three) times daily.    Marland Kitchen nystatin (MYCOSTATIN/NYSTOP) 100000 UNIT/GM POWD Apply 1 g topically 3 (three) times daily. 60 g 5  . ondansetron (ZOFRAN) 4 MG tablet Take 1 tablet (4 mg total) by mouth every 6 (six) hours as needed for nausea. 20 tablet 0  . oxyCODONE-acetaminophen (PERCOCET) 10-325 MG tablet Take 1 tablet by mouth 5 (five) times daily as needed  for pain. Do not take for 11 hours after taking Xanax. Max.: 5/day. 120 tablet 0  . promethazine (PHENERGAN) 12.5 MG tablet Take 12.5 mg by mouth every 6 (six) hours.    . rifaximin (XIFAXAN) 550 MG TABS tablet Take 550 mg by mouth 2 (two) times daily.    . rizatriptan (MAXALT) 10 MG tablet Take 10 mg by mouth as needed for migraine.     . simethicone (MYLICON) 80 MG chewable tablet Chew 1 tablet (80 mg total) by mouth 4 (four) times daily. 30 tablet 0  . spironolactone (ALDACTONE) 100 MG tablet Take 1 tablet by mouth daily.    . Vitamin D, Ergocalciferol, (DRISDOL) 50000 units CAPS capsule Take 1 capsule (50,000 Units total) by mouth 2 (two) times a week. X 6 weeks. 12 capsule 0  . [DISCONTINUED] SUMAtriptan (IMITREX) 50 MG tablet Take 50 mg by mouth every 2 (two) hours as needed. For migraines     No current facility-administered medications for this visit.    OBJECTIVE:  Filed Vitals:   01/14/15 1039  BP: 110/72  Pulse: 86  Temp: 97.8 F (36.6 C)  Resp: 18     Body mass index is 40.8 kg/(m^2).    ECOG FS:1 - Symptomatic but completely ambulatory  Physical  Exam  Constitutional: She is oriented to person, place, and time and well-developed, well-nourished, and in no distress. No distress.  Morbidly obese Caucasian female  HENT:  Head: Normocephalic and atraumatic.  Right Ear: External ear normal.  Left Ear: External ear normal.  Mouth/Throat: Oropharynx is clear and moist.  Eyes: Conjunctivae are normal. Pupils are equal, round, and reactive to light. Right eye exhibits no discharge. Left eye exhibits no discharge. No scleral icterus.  Neck: Normal range of motion. Neck supple. No JVD present. No tracheal deviation present. No thyromegaly present.  Cardiovascular: Normal rate, regular rhythm, normal heart sounds and intact distal pulses.  Exam reveals no gallop and no friction rub.   No murmur heard. Pulmonary/Chest: Effort normal and breath sounds normal. No stridor. No respiratory distress. She has no wheezes. She has no rales. She exhibits no tenderness.  Abdominal: Soft. Bowel sounds are normal. She exhibits no distension and no mass. There is no tenderness. There is no rebound and no guarding.  Obese, detection of organomegaly is difficult  Genitourinary:  Postponed  Musculoskeletal: Normal range of motion. She exhibits tenderness (To palpation of thoracic and lumbar spine). She exhibits no edema.  Lymphadenopathy:    She has no cervical adenopathy.  Neurological: She is alert and oriented to person, place, and time. She has normal reflexes. No cranial nerve deficit. She exhibits normal muscle tone. Gait normal. Coordination normal. GCS score is 15.  Skin: Skin is warm. No rash noted. She is not diaphoretic. No erythema. No pallor.  Psychiatric: Mood, affect and judgment normal.  Claims to have difficulties with short-term memory  Nursing note and vitals reviewed.    LAB RESULTS:  CBC Latest Ref Rng 10/05/2014 10/04/2014  WBC 3.6 - 11.0 K/uL 3.3(L) 5.6  Hemoglobin 12.0 - 16.0 g/dL 9.5(L) 10.4(L)  Hematocrit 35.0 - 47.0 % 28.0(L)  31.1(L)  Platelets 150 - 440 K/uL 214 258    No visits with results within 5 Day(s) from this visit. Latest known visit with results is:  Hospital Outpatient Visit on 01/08/2015  Component Date Value Ref Range Status  . CRP 01/08/2015 2.8* <1.0 mg/dL Final   Performed at [facility]  . Magnesium 01/08/2015 1.9  1.7 - 2.4 mg/dL Final  . Sed Rate 01/08/2015 35* 0 - 30 mm/hr Final  . Vit D, 1,25-Dihydroxy 01/08/2015 28.8  19.9 - 79.3 pg/mL Final  . Vit D, 25-Hydroxy 01/08/2015 14.3* 30.0 - 100.0 ng/mL Final   Comment: (NOTE) Vitamin D deficiency has been defined by the [place] practice guideline as a level of serum 25-OH vitamin D less than 20 ng/mL (1,2). The Endocrine Society went on to further define vitamin D insufficiency as a level between 21 and 29 ng/mL (2). 1. IOM (Institute of Medicine). 2010. Dietary reference   intakes for calcium and D. [place]: The   [employer]. 2. Holick MF, Binkley [place], Bischoff-Ferrari HA, et al.   Evaluation, treatment, and prevention of vitamin D   deficiency: an Endocrine Society clinical practice   guideline. JCEM. 2011 Jul; 96(7):1911-30. Performed At: [facility] [place], [place] [id number] Lindon Romp MD WU:[id number]   . Vitamin B-12 01/08/2015 172* 180 - 914 pg/mL Final   Comment: (NOTE) This assay is not validated for testing neonatal or myeloproliferative syndrome specimens for Vitamin B12 levels. Performed at [facility]   . Folate 01/08/2015 5.6* >5.9 ng/mL Final  . Sodium 01/08/2015 142  135 - 145 mmol/L Final  . Potassium 01/08/2015 3.4* 3.5 - 5.1 mmol/L Final  . Chloride 01/08/2015 102  101 - 111 mmol/L Final  . CO2 01/08/2015 31  22 - 32 mmol/L Final  . Glucose, Bld 01/08/2015 202* 65 - 99 mg/dL Final  . BUN 01/08/2015 11  6 - 20 mg/dL Final  . Creatinine, Ser 01/08/2015 0.61  0.44 - 1.00 mg/dL Final  . Calcium 01/08/2015  9.3  8.9 - 10.3 mg/dL Final  . Total Protein 01/08/2015 9.0* 6.5 - 8.1 g/dL Final  . Albumin 01/08/2015 4.6  3.5 - 5.0 g/dL Final  . AST 01/08/2015 38  15 - 41 U/L Final  . ALT 01/08/2015 46  14 - 54 U/L Final  . Alkaline Phosphatase 01/08/2015 87  38 - 126 U/L Final  . Total Bilirubin 01/08/2015 0.7  0.3 - 1.2 mg/dL Final  . GFR calc non Af Amer 01/08/2015 >60  >60 mL/min Final  . GFR calc Af Amer 01/08/2015 >60  >60 mL/min Final   Comment: (NOTE) The eGFR has been calculated using the CKD EPI equation. This calculation has not been validated in all clinical situations. eGFR's persistently <60 mL/min signify possible Chronic Kidney Disease.   . Anion gap 01/08/2015 9  5 - 15 Final       STUDIES: No results found.  ASSESSMENT and PLAN: Thrombocytopenia, history of anemia -CBC has significantly improved since the patient's discharge from the hospital. 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 This condition does not require follow-up with hematology, since universally it only is associated with a mild to moderate cytopenias, which are clinically insignificant. It is likely that vitamin B12 deficiency contributes to thrombocytopenia  Vitamin B12 and folate deficiency-patient was started on an active vitamin B12 and and folate supplementation and can be managed by a primary care physician. If oral supplementation appears to be unsuccessful with persistently low vitamin B12 levels over the next 2 months, then the patient should receive parenteral injections of vitamin B12. Initial course should consist of 5 daily injections, followed by 4 weekly injections followed by monthly injections thereafter.  Concerns for him hemophagocytic lymphohistiocytosis-  this diagnosis was certainly a concern in September 2016, when she was severely ill and admitted to [facility] with persistent fever  after complicated cholecystectomy. Most of the results of the studies, which were requested during her admission are finally available for review, although natural killer activity report cannot be viewed in care everywhere. Although soluble interleukin-2 receptor levels were elevated, still, patient did not fulfill all the criteria for [place], since splenomegaly and cytopenias could be related to liver cirrhosis, there was no tissue biopsy, which would reveal hemophagocytosis, triglycerides and fibrinogen were within normal range and ferritin levels were not significantly elevated. Also, patient has improved on IV antibiotics, and has not had fevers since her discharge from the hospital in October 2016. All these factors in fact makes the diagnosis of [place] unlikely.  Abnormalities in porphyrin metabolism-certainly, abdominal pain could be associated with hereditary coproporphyria and variegate porphyria, but they history does not support these diagnoses, since the patient does not have a long history of abdominal pain, rather, it clearly is associated with a surgical intervention in 2016. Abnormalities, detected in September were likely related to significant stress and multiple antibiotics. We will repeat quantitative porphyrins in a random urine sample, but I suspect that the results should be normal by now.  No follow-up appointments were given, however patient can be referred to [place] again if any significant issues or concerns arise. Patient expressed understanding and was in agreement with this plan. She also understands that She can call clinic at any time with any questions, concerns, or complaints.    No matching staging information was found for the patient.  Roxana Hires, MD   01/14/2015 9:51 AM

## 2015-01-16 ENCOUNTER — Ambulatory Visit: Payer: Medicaid Other

## 2015-01-16 LAB — MISC LABCORP TEST (SEND OUT): Labcorp test code: 120980

## 2015-01-16 LAB — TOXASSURE SELECT 13 (MW), URINE: PDF: 0

## 2015-01-29 ENCOUNTER — Ambulatory Visit
Admission: RE | Admit: 2015-01-29 | Discharge: 2015-01-29 | Disposition: A | Discharge status: Home / Self Care | Payer: Medicaid Other | Source: Ambulatory Visit | Attending: Gastroenterology | Admitting: Gastroenterology

## 2015-01-29 ENCOUNTER — Other Ambulatory Visit: Payer: Self-pay | Admitting: Gastroenterology

## 2015-01-29 DIAGNOSIS — R1011 Right upper quadrant pain: Secondary | ICD-10-CM | POA: Insufficient documentation

## 2015-01-29 DIAGNOSIS — K7460 Unspecified cirrhosis of liver: Secondary | ICD-10-CM

## 2015-01-29 DIAGNOSIS — Z9049 Acquired absence of other specified parts of digestive tract: Secondary | ICD-10-CM | POA: Insufficient documentation

## 2015-01-29 DIAGNOSIS — R161 Splenomegaly, not elsewhere classified: Secondary | ICD-10-CM | POA: Insufficient documentation

## 2015-01-29 MED ORDER — IOHEXOL 300 MG/ML  SOLN
100.0000 mL | Freq: Once | INTRAMUSCULAR | Status: AC | PRN
  Administered 2015-01-29: 100 mL via INTRAVENOUS

## 2015-01-30 ENCOUNTER — Ambulatory Visit: Payer: Medicaid Other

## 2015-02-03 ENCOUNTER — Ambulatory Visit
Admission: RE | Admit: 2015-02-03 | Discharge: 2015-02-03 | Disposition: A | Discharge status: Home / Self Care | Payer: Medicaid Other | Source: Ambulatory Visit | Attending: Pain Medicine | Admitting: Pain Medicine

## 2015-02-03 DIAGNOSIS — M4806 Spinal stenosis, lumbar region: Secondary | ICD-10-CM | POA: Insufficient documentation

## 2015-02-03 DIAGNOSIS — M5136 Other intervertebral disc degeneration, lumbar region: Secondary | ICD-10-CM | POA: Diagnosis not present

## 2015-02-03 DIAGNOSIS — M5124 Other intervertebral disc displacement, thoracic region: Secondary | ICD-10-CM | POA: Insufficient documentation

## 2015-02-03 DIAGNOSIS — M5416 Radiculopathy, lumbar region: Secondary | ICD-10-CM | POA: Insufficient documentation

## 2015-02-05 ENCOUNTER — Ambulatory Visit: Payer: Medicaid Other | Attending: Pain Medicine | Admitting: Pain Medicine

## 2015-02-05 ENCOUNTER — Other Ambulatory Visit: Payer: Self-pay | Admitting: Pain Medicine

## 2015-02-05 ENCOUNTER — Encounter: Payer: Self-pay | Admitting: Pain Medicine

## 2015-02-05 VITALS — BP 133/85 | HR 117 | Temp 98.2°F | Resp 18 | Ht 62.0 in | Wt 212.0 lb

## 2015-02-05 DIAGNOSIS — R109 Unspecified abdominal pain: Secondary | ICD-10-CM | POA: Insufficient documentation

## 2015-02-05 DIAGNOSIS — M5416 Radiculopathy, lumbar region: Secondary | ICD-10-CM | POA: Insufficient documentation

## 2015-02-05 DIAGNOSIS — M791 Myalgia: Secondary | ICD-10-CM | POA: Insufficient documentation

## 2015-02-05 DIAGNOSIS — Z5181 Encounter for therapeutic drug level monitoring: Secondary | ICD-10-CM

## 2015-02-05 DIAGNOSIS — M4806 Spinal stenosis, lumbar region: Secondary | ICD-10-CM | POA: Insufficient documentation

## 2015-02-05 DIAGNOSIS — E538 Deficiency of other specified B group vitamins: Secondary | ICD-10-CM | POA: Insufficient documentation

## 2015-02-05 DIAGNOSIS — L02211 Cutaneous abscess of abdominal wall: Secondary | ICD-10-CM | POA: Insufficient documentation

## 2015-02-05 DIAGNOSIS — K219 Gastro-esophageal reflux disease without esophagitis: Secondary | ICD-10-CM | POA: Insufficient documentation

## 2015-02-05 DIAGNOSIS — M79604 Pain in right leg: Secondary | ICD-10-CM

## 2015-02-05 DIAGNOSIS — J449 Chronic obstructive pulmonary disease, unspecified: Secondary | ICD-10-CM | POA: Insufficient documentation

## 2015-02-05 DIAGNOSIS — M545 Low back pain, unspecified: Secondary | ICD-10-CM

## 2015-02-05 DIAGNOSIS — E559 Vitamin D deficiency, unspecified: Secondary | ICD-10-CM | POA: Insufficient documentation

## 2015-02-05 DIAGNOSIS — F1190 Opioid use, unspecified, uncomplicated: Secondary | ICD-10-CM | POA: Insufficient documentation

## 2015-02-05 DIAGNOSIS — E119 Type 2 diabetes mellitus without complications: Secondary | ICD-10-CM | POA: Insufficient documentation

## 2015-02-05 DIAGNOSIS — I10 Essential (primary) hypertension: Secondary | ICD-10-CM | POA: Insufficient documentation

## 2015-02-05 DIAGNOSIS — Z79891 Long term (current) use of opiate analgesic: Secondary | ICD-10-CM

## 2015-02-05 DIAGNOSIS — M79605 Pain in left leg: Secondary | ICD-10-CM | POA: Insufficient documentation

## 2015-02-05 DIAGNOSIS — F329 Major depressive disorder, single episode, unspecified: Secondary | ICD-10-CM | POA: Insufficient documentation

## 2015-02-05 DIAGNOSIS — F419 Anxiety disorder, unspecified: Secondary | ICD-10-CM | POA: Insufficient documentation

## 2015-02-05 DIAGNOSIS — M542 Cervicalgia: Secondary | ICD-10-CM | POA: Insufficient documentation

## 2015-02-05 DIAGNOSIS — G8929 Other chronic pain: Secondary | ICD-10-CM | POA: Insufficient documentation

## 2015-02-05 DIAGNOSIS — G4733 Obstructive sleep apnea (adult) (pediatric): Secondary | ICD-10-CM | POA: Insufficient documentation

## 2015-02-05 DIAGNOSIS — E079 Disorder of thyroid, unspecified: Secondary | ICD-10-CM | POA: Insufficient documentation

## 2015-02-05 DIAGNOSIS — M5414 Radiculopathy, thoracic region: Secondary | ICD-10-CM | POA: Insufficient documentation

## 2015-02-05 MED ORDER — OXYCODONE-ACETAMINOPHEN 10-325 MG PO TABS: 1.0000 | ORAL_TABLET | Freq: Every day | ORAL | Status: DC | PRN

## 2015-02-05 NOTE — Progress Notes (Signed)
Safety precautions to be maintained throughout the outpatient stay will include: orient to surroundings, keep bed in low position, maintain call bell within reach at all times, provide assistance with transfer out of bed and ambulation. Percocet pill count # 0/120  Filled 01-08-15

## 2015-02-06 NOTE — Progress Notes (Signed)
Patient's Name: Lori Mckenzie MRN: [id number] DOB: 10-Dec-1965 DOS: 02/05/2015  Primary Reason(s) for Visit: Encounter for Medication Management CC: Back Pain   HPI  Lori Mckenzie is a 50 y.o. year old, female patient, who returns today as an established patient. She has Type 2 diabetes mellitus ([place]); COPD (chronic obstructive pulmonary disease) (HCC); HTN (hypertension); GERD (gastroesophageal reflux disease); OSA on CPAP; Anxiety; Abdominal pain; Steatohepatitis; Bile leak, postoperative; Dysthymia; Other social stressor; Abdominal abscess ([place]); Ascites; NASH (nonalcoholic steatohepatitis); Hypokalemia; Diffuse abdominal pain; Pyrexia; Chronic pain; Opiate use; Long term prescription opiate use; Long term current use of opiate analgesic; Encounter for therapeutic drug level monitoring; Abdominal pain, chronic, epigastric; Chronic low back pain (Location of Primary Source of Pain); Chronic neck pain; Bilateral lower extremity pain; Breath shortness; Diastolic dysfunction; Clinical depression; Chronic obstructive pulmonary disease ([place]); Airway hyperreactivity; Anxiety state; Acid reflux; Essential (primary) hypertension; Lumbar canal stenosis; Lumbar and sacral osteoarthritis; Myofascial pain; Sleep apnea; Cervical disc disease; Fall; Drowsiness; Episode of syncope; Subacute lumbar radiculopathy (left side) (S1 dermatome); Vitamin D deficiency; B12 deficiency; Folate deficiency; and Thoracic radiculitis on her problem list.. Her primarily concern today is the Back Pain   The patient returns to the clinics today for pharmacological management of her chronic pain. Today's the patient's primary complaint is that of pain in the upper abdominal region. A recent MRI showed some stenosis in the lower thoracic region and the radiologist have requested for a thoracic MRI to be considered. Since the patient has abdominal pain could also be associated with some degree of thoracic radiculitis, we will be ordering this MRI to  rule out any other spinal stenosis at a higher level.  Reported Pain Score: 7 , clinically she looks like a 3-4/10. Reported level is inconsistent with clinical obrservations. Pain Type: Chronic pain Pain Location: Back Pain Orientation: Mid, Lower Pain Descriptors / Indicators: Burning, Aching, Discomfort, Stabbing Pain Frequency: Constant  Date of Last Visit: 01/08/15 Service Provided on Last Visit: Med Refill  Pharmacotherapy  Medication(s): Oxycodone/APAP 10/325 one tablet by mouth 5 times a day. Onset of action: Within expected pharmacological parameters Time to Peak effect: Timing and results are as within normal expected parameters Analgesic Effect: More than 50% Activity Facilitation: Medication(s) allow patient to sit, stand, walk, and do the basic ADLs Perceived Effectiveness: Described as relatively effective, allowing for increase in activities of daily living (ADL) Side-effects or Adverse reactions: None reported Duration of action: Within normal limits for medication [place] PMP: Compliant with practice rules and regulations UDS Results: The patient's last UDS was done on 01/08/2015 and it came back with expected results from the opioids. It did not to take the alprazolam that the patient indicated he had been taking. UDS Interpretation: Patient appears to be compliant with practice rules and regulations Medication Assessment Form: Reviewed. Patient indicates being compliant with therapy Treatment compliance: Compliant Substance Use Disorder (SUD) Risk Level: Low Pharmacologic Plan: Continue therapy as is  Lab Work: Illicit Drugs Lab Results  Component Value Date   THCU NEGATIVE 08/26/2013   PCPSCRNUR NEGATIVE 08/26/2013   MDMA NEGATIVE 08/26/2013   AMPHETMU NEGATIVE 08/26/2013   METHADONE NEGATIVE 08/26/2013   ETOH < 3 10/18/2011    Inflammation Markers Lab Results  Component Value Date   ESRSEDRATE 35* 01/08/2015   CRP 2.8* 01/08/2015    Renal Function Lab  Results  Component Value Date   BUN 11 01/08/2015   CREATININE 0.61 01/08/2015   GFRAA >60 01/08/2015   GFRNONAA >60 01/08/2015  Hepatic Function Lab Results  Component Value Date   AST 38 01/08/2015   ALT 46 01/08/2015   ALBUMIN 4.6 01/08/2015    Electrolytes Lab Results  Component Value Date   NA 142 01/08/2015   K 3.4* 01/08/2015   CL 102 01/08/2015   CALCIUM 9.3 01/08/2015   MG 1.9 01/08/2015    Allergies  Lori Mckenzie is allergic to tape and vicodin.  Meds  The patient has a current medication list which includes the following prescription(s): albuterol, alprazolam, aspirin ec, citalopram, cyanocobalamin, dicyclomine, famotidine, fluticasone, intrinsi b12-folate, furosemide, gabapentin, hydrochlorothiazide, lactulose, loperamide, metformin, nystatin, omeprazole, ondansetron, oxycodone-acetaminophen, promethazine, rifaximin, rizatriptan, spironolactone, and vitamin d (ergocalciferol).  Current Outpatient Prescriptions on File Prior to Visit  Medication Sig  . albuterol (PROVENTIL HFA;VENTOLIN HFA) 108 (90 BASE) MCG/ACT inhaler Inhale 2 puffs into the lungs every 4 (four) hours as needed for wheezing or shortness of breath.  . ALPRAZolam (XANAX) 1 MG tablet Take 1 tablet (1 mg total) by mouth 3 (three) times daily as needed for anxiety. (Patient taking differently: Take 2 mg by mouth 3 (three) times daily as needed. )  . aspirin EC 81 MG tablet Take 81 mg by mouth daily.  . citalopram (CELEXA) 20 MG tablet Take 20 mg by mouth at bedtime.   . cyanocobalamin ([employer] VITAMIN B12) 2000 MCG tablet Take 1 tablet (2,000 mcg total) by mouth daily.  . fluticasone (FLONASE) 50 MCG/ACT nasal spray Place 1 spray into both nostrils daily as needed for rhinitis.   Derald Macleod Factor (INTRINSI B12-FOLATE) 161-096-04 MCG-MCG-MG TABS Take 500 mcg by mouth daily.  Marland Kitchen gabapentin (NEURONTIN) 300 MG capsule Take 600 mg by mouth 3 (three) times daily.   . hydrochlorothiazide  (HYDRODIURIL) 25 MG tablet Take 25 mg by mouth daily. On scale based on b/p  . loperamide (IMODIUM) 2 MG capsule Take 1 capsule (2 mg total) by mouth every 6 (six) hours as needed for diarrhea or loose stools.  . metFORMIN (GLUCOPHAGE) 1000 MG tablet Take 1,000 mg by mouth 2 (two) times daily with a meal. On sliding scale  . omeprazole (PRILOSEC) 20 MG capsule Take 20 mg by mouth 2 (two) times daily before a meal.  . ondansetron (ZOFRAN) 4 MG tablet Take 1 tablet (4 mg total) by mouth every 6 (six) hours as needed for nausea.  . promethazine (PHENERGAN) 12.5 MG tablet Take 12.5 mg by mouth every 6 (six) hours.  . rifaximin (XIFAXAN) 550 MG TABS tablet Take 550 mg by mouth 2 (two) times daily. Reported on 02/05/2015  . rizatriptan (MAXALT) 10 MG tablet Take 10 mg by mouth as needed for migraine.   Marland Kitchen spironolactone (ALDACTONE) 100 MG tablet Take 1 tablet by mouth daily.  . Vitamin D, Ergocalciferol, (DRISDOL) 50000 units CAPS capsule Take 1 capsule (50,000 Units total) by mouth 2 (two) times a week. X 6 weeks.  . [DISCONTINUED] SUMAtriptan (IMITREX) 50 MG tablet Take 50 mg by mouth every 2 (two) hours as needed. For migraines   No current facility-administered medications on file prior to visit.    ROS  Constitutional: Afebrile, no chills, well hydrated and well nourished Gastrointestinal: negative Musculoskeletal:negative Neurological: negative Behavioral/Psych: negative  PFSH  Medical:  Lori Mckenzie  has a past medical history of Brittle bone disease; Asthma; Thyroid disease; Hypertension; Back pain; Diabetes mellitus without complication ([place]); Chronic kidney disease; Anxiety; GERD (gastroesophageal reflux disease); Sleep apnea; COPD (chronic obstructive pulmonary disease) ([place]); Hypothyroidism; Cervical disc disease; TIA (transient ischemic attack); Cancer ([place]);  Collagen vascular disease ([place]); Ascites; NASH (nonalcoholic steatohepatitis); NASH (nonalcoholic steatohepatitis); Sleep apnea; and  Migraines. Family: family history includes Heart disease in her brother and sister; Lung cancer in her father; Ulcers in her father and sister. Surgical:  has past surgical history that includes Abdominal hysterectomy; Tubal ligation; Colonoscopy with propofol (N/A, 06/23/2014); Esophagogastroduodenoscopy (N/A, 06/23/2014); Cholecystectomy (N/A, 07/15/2014); ERCP (N/A, 07/16/2014); Wisdom tooth extraction; and ERCP (N/A, 10/03/2014). Tobacco:  reports that she has never smoked. She has never used smokeless tobacco. Alcohol:  reports that she does not drink alcohol. Drug:  reports that she does not use illicit drugs.  Physical Exam  Vitals:  Today's Vitals   02/05/15 1339 02/05/15 1341  BP: 133/85   Pulse: 117   Temp: 98.2 F (36.8 C)   Resp: 18   Height: 5' 2"  (1.575 m)   Weight: 212 lb (96.163 kg)   SpO2: 99%   PainSc: 7  7   PainLoc: Back     Calculated BMI: Body mass index is 38.77 kg/(m^2).  General appearance: alert, cooperative, appears stated age and no distress Eyes: PERLA Respiratory: No evidence respiratory distress, no audible rales or ronchi and no use of accessory muscles of respiration  Cervical Spine Inspection: Normal anatomy Alignment: Symetrical ROM: Adequate  Upper Extremities Inspection: No gross anomalies detected ROM: Adequate Sensory: Normal Motor: Unremarkable  Thoracic Spine Inspection: No gross anomalies detected Alignment: Symetrical ROM: Adequate Palpation: Tender  Lumbar Spine Inspection: No gross anomalies detected Alignment: Symetrical ROM: Decreased Palpation: Tender Provocative Tests:  Lumbar Hyperextension and rotation test:  Positive bilaterally. Patrick's Maneuver: deferred Gait: WNL  Lower Extremities Inspection: No gross anomalies detected ROM: Adequate Sensory:  Normal Motor: Unremarkable  Assessment & Plan  Primary Diagnosis & Pertinent Problem List: The primary encounter diagnosis was Chronic pain. Diagnoses of  Encounter for therapeutic drug level monitoring, Long term current use of opiate analgesic, Chronic low back pain, Thoracic radiculitis, and Bilateral lower extremity pain were also pertinent to this visit.  Visit Diagnosis: 1. Chronic pain   2. Encounter for therapeutic drug level monitoring   3. Long term current use of opiate analgesic   4. Chronic low back pain   5. Thoracic radiculitis   6. Bilateral lower extremity pain     Assessment: No problem-specific assessment & plan notes found for this encounter.   Plan of Care  Pharmacotherapy (Medications Ordered): Meds ordered this encounter  Medications  . oxyCODONE-acetaminophen (PERCOCET) 10-325 MG tablet    Sig: Take 1 tablet by mouth 5 (five) times daily as needed for pain. Do not take for 11 hours after taking Xanax. Max.: 5/day.    Dispense:  150 tablet    Refill:  0    Do not place this medication, or any other prescription from our practice, on "Automatic Refill". Patient may have prescription filled one day early if pharmacy is closed on scheduled refill date. Do not fill until: 02/05/15 To last until: 03/07/15    [facility] & Procedure Ordered: Orders Placed This Encounter  Procedures  . MR Thoracic Spine Wo Contrast    Standing Status: Future     Number of Occurrences:      Standing Expiration Date: 02/05/2016    Scheduling Instructions:     Please provide canal diameter in millimeters when describing any spinal stenosis.    Order Specific Question:  Reason for Exam (SYMPTOM  OR DIAGNOSIS REQUIRED)    Answer:  Thoracic radicular pain    Order Specific Question:  Preferred imaging  location?    Answer:  [facility]    Order Specific Question:  Does the patient have a pacemaker or implanted devices?    Answer:  No    Order Specific Question:  What is the patient's sedation requirement?    Answer:  No Sedation    Order Specific Question:  Call Results- Best Contact Number?    Answer:  [phone] (Pain  Clinic facility) (Dr. Dossie Arbour)    Imaging Ordered: MR THORACIC SPINE WO CONTRAST  Interventional Therapies: Scheduled: None at this time. PRN Procedures: None at this time.    Referral(s) or Consult(s): None at this time.  Medications administered during this visit: Lori Mckenzie had no medications administered during this visit.  Future Appointments Date Time Provider [place]  02/25/2015 2:00 PM [facility] 1 [facility]  03/05/2015 1:40 PM Milinda Pointer, MD [facility] None    Primary Care Physician: Ardine Eng, MD Location: [facility] Facility Note by: Kathlen Brunswick. Dossie Arbour, M.D, DABA, DABAPM, DABPM, DABIPP, FIPP

## 2015-02-12 LAB — TOXASSURE SELECT 13 (MW), URINE: PDF: 0

## 2015-02-12 NOTE — Progress Notes (Signed)
Quick Note:  Normal levels of Vitamin D for our Lab are between 30 and 100 ng/mL. The results of this test indicate that this patient has low levels of Vitamin D. A vitamin D level below 20 ng/ml, is diagnosed as a "Vitamin D Deficiency". Levels between 20-30 ng/ml are defined as a "Vitamin D insufficiency". Common causes include: dietary insufficiency; inadequate sun exposure; inability to absorb vitamin D from the intestines; or inability to process it due to kidney or liver disease. Associated complications may include hypocalcemia, hypophosphatemia, and reduced bone density. Vitamin D deficiencies and insufficiencies may be associated with fatigue, weakness, bone pain, joint pain, and muscle pain. Patient may benefit from taking over-the-counter Vitamin D3 supplements. I recommend a vitamin D + Calcium supplements. "Natures Bounty", a brand easily found in most pharmacies, has a formulation containing Calcium 1200 mg plus Vitamin D3 1000 IU, in Softgels capsules that are easy to swallow. This should be taken once a day, preferably in the morning as vitamin D will increase energy levels and make it difficult to fall asleep, if taken at night. Patients with levels lower than 20 ng/ml should contact their primary care physicians to receive replacement therapy. Vitamin D3 can be obtained over-the-counter, without a prescription. Vitamin D2 requires a prescription and it is used for replacement therapy.  ______

## 2015-02-12 NOTE — Progress Notes (Signed)
Quick Note:  Lab results reviewed and found to be within normal limits. ______ 

## 2015-02-12 NOTE — Progress Notes (Signed)
Quick Note:  Normal levels of C-Reactive Protein for our Lab are less than 1.0 mg/L. C-reactive protein (CRP) is produced by the liver. The level of CRP rises when there is inflammation throughout the body. CRP goes up in response to inflammation. High levels suggests the presence of chronic inflammation but do not identify its location or cause. High levels have been observed in obese patients, individuals with bacterial infections, chronic inflammation, or flare-ups of inflammatory conditions. Drops of previously elevated levels suggest that the inflammation or infection is subsiding and/or responding to treatment. ______

## 2015-02-12 NOTE — Progress Notes (Signed)
Quick Note:   Normal Vitamin B-12 levels are between 180 and 914 pg/mL, for our Lab. Patients with levels below 180 pg/ml are considered to have a Vitamin B-12 "deficiency". Older patients with levels between 200 and 500 may be symptomatic, in which case it is considered an "insufficiency". Symptoms of deficiency or insufficiency may include: tingling and numbness of the digits (fingers & toes), generalized muscle weakness, staggering, irritability, confusion, forgetfulness, tenderness, fatigue, shortness of breath, palpitation, anemia, sporadic episodes of diarrhea, decreased immune system, cognitive impairment, and degeneration of the posterior sensory columns of the spinal cord. Deficiency can lead to anemia and congestive heart failure. Lack of vitamin B12 may lead to peripheral neuropathy. The recommended over-the-counter Vitamin B12 dose intake for deficiency is 125 to 2,000 micrograms of cyanocobalamin taken by mouth, daily.  ______ 

## 2015-02-12 NOTE — Progress Notes (Signed)
Quick Note:   A normal sedimentation rate should be below 30 mm/hr. The sed rate is an acute phase reactant that indirectly measures the degree of inflammation present in the body. It can be acute, developing rapidly after trauma, injury or infection, for example, or can occur over an extended time (chronic) with conditions such as autoimmune diseases or cancer. The ESR is not diagnostic; it is a non-specific, screening test that may be elevated in a number of these different conditions. It provides general information about the presence or absence of an inflammatory condition.  The combined elevation of the ESR & CRP, may be suggestive of an autoimmune disease. Should this be the case, we will inquire if the patient has had a rheumatologic evaluation looking at the RF levels, ANA levels, and CBC.  ______ 

## 2015-02-12 NOTE — Progress Notes (Signed)
Quick Note:  Potassium levels below 3.6 mmol/L are considered to be low. Levels (less than 2.5 mmol/L) can be life-threatening and requires urgent medical attention. Low potassium (hypokalemia) has many causes. The most common cause is excessive potassium loss in urine due to prescription water or fluid pills (diuretics). Vomiting or diarrhea or both can result in excessive potassium loss from the digestive tract. Causes of potassium loss leading to low potassium include: chronic kidney disease; diabetic ketoacidosis; diarrhea; excessive alcohol use; excessive laxative use; excessive sweating; folic acid deficiency; diuretics; primary aldosteronism; vomiting; and/or some antibiotic use.  Normal fasting (NPO x 8 hours) glucose levels are between 65-99 mg/dl, with 2 hour fasting, levels are usually less than 140 mg/dl. Any random blood glucose level greater than 200 mg/dl is considered to be Diabetes.  Results of a total protein test are usually considered along with those from other tests of the CMP and will give the healthcare practitioner information on a person's general health status with regard to nutrition and/or conditions involving major organs, such as the kidney and liver. A high total protein level may be seen with chronic inflammation or infections such as viral hepatitis or HIV. It also may be associated with bone marrow disorders such as multiple myeloma. Possible causes of high blood protein include: Bone marrow disorder Multiple myeloma Amyloidosis Monoclonal gammopathy of undetermined significance (MGUS) Chronic inflammatory conditions HIV/AIDS Dehydration (which may make blood proteins appear falsely elevated)  A high-protein diet doesn't cause high blood protein.  High blood protein is not a specific disease or condition in itself. It's usually a laboratory finding uncovered during the evaluation of a particular condition or symptom. For instance, although high blood protein is  found in people who are dehydrated, the real problem is that the blood plasma is actually more concentrated.  Certain proteins in the blood may be elevated as your body fights an infection or some other inflammation. People with certain bone marrow diseases, such as multiple myeloma, may have high blood protein levels before they show any other symptoms.  ______

## 2015-02-14 NOTE — Progress Notes (Signed)
Quick Note:  This imaging study has been reviewed and not found to have any major pathology requiring urgent or emergency care. Imaging reveals: Lumbar Spine MRI -  1. Disc degeneration maximal in the lower thoracic and upper lumbar spine. Suspect up to moderate lower thoracic spinal stenosis at T10-T11 related to a partially calcified disc herniation which is partially visible on this study. Follow-up thoracic spine MRI would evaluate further as necessary. 2. Mild multifactorial lumbar spinal stenosis at L1-L2 and L4-L5. 3. Moderate left T11 neural foraminal stenosis. No lumbar foraminal stenosis. 4. No acute osseous abnormality in the lumbar spine.  Consider: Thoracic ESI vs. Review if Thoracic MRI is needed vs LESI at L1-2 vs L4-5 vs left T11 TFESI ______

## 2015-02-14 NOTE — Progress Notes (Signed)
Quick Note:  An unreported benzodiazepine was detected in the sample. CDC reports have identified the combination of opioids and benzodiazepines (Valium, Ativan, Xanax, Librium, Tranxene, Klonopin, Dalmane, Halcion,Restoril, etc.) to be associated in a significant number of reported drug-to-drug interactions leading to accidental overdosing leading to respiratory failure and death. ______

## 2015-02-25 ENCOUNTER — Ambulatory Visit: Payer: Medicaid Other

## 2015-03-05 ENCOUNTER — Encounter: Payer: Self-pay | Admitting: Pain Medicine

## 2015-03-05 ENCOUNTER — Other Ambulatory Visit
Admission: RE | Admit: 2015-03-05 | Discharge: 2015-03-05 | Disposition: A | Discharge status: Home / Self Care | Payer: Medicaid Other | Source: Ambulatory Visit | Attending: Pain Medicine | Admitting: Pain Medicine

## 2015-03-05 ENCOUNTER — Ambulatory Visit: Payer: Medicaid Other | Attending: Pain Medicine | Admitting: Pain Medicine

## 2015-03-05 ENCOUNTER — Telehealth: Payer: Self-pay | Admitting: Pain Medicine

## 2015-03-05 VITALS — BP 98/60 | HR 96 | Temp 97.9°F | Resp 16 | Ht 62.0 in | Wt 208.0 lb

## 2015-03-05 DIAGNOSIS — M545 Low back pain: Secondary | ICD-10-CM

## 2015-03-05 DIAGNOSIS — M5136 Other intervertebral disc degeneration, lumbar region: Secondary | ICD-10-CM | POA: Insufficient documentation

## 2015-03-05 DIAGNOSIS — E559 Vitamin D deficiency, unspecified: Secondary | ICD-10-CM | POA: Insufficient documentation

## 2015-03-05 DIAGNOSIS — I10 Essential (primary) hypertension: Secondary | ICD-10-CM | POA: Insufficient documentation

## 2015-03-05 DIAGNOSIS — G4733 Obstructive sleep apnea (adult) (pediatric): Secondary | ICD-10-CM | POA: Insufficient documentation

## 2015-03-05 DIAGNOSIS — M25512 Pain in left shoulder: Secondary | ICD-10-CM

## 2015-03-05 DIAGNOSIS — M818 Other osteoporosis without current pathological fracture: Secondary | ICD-10-CM

## 2015-03-05 DIAGNOSIS — K219 Gastro-esophageal reflux disease without esophagitis: Secondary | ICD-10-CM | POA: Diagnosis not present

## 2015-03-05 DIAGNOSIS — M069 Rheumatoid arthritis, unspecified: Secondary | ICD-10-CM | POA: Diagnosis not present

## 2015-03-05 DIAGNOSIS — R1013 Epigastric pain: Secondary | ICD-10-CM | POA: Insufficient documentation

## 2015-03-05 DIAGNOSIS — F329 Major depressive disorder, single episode, unspecified: Secondary | ICD-10-CM | POA: Insufficient documentation

## 2015-03-05 DIAGNOSIS — M25559 Pain in unspecified hip: Secondary | ICD-10-CM | POA: Diagnosis present

## 2015-03-05 DIAGNOSIS — M47896 Other spondylosis, lumbar region: Secondary | ICD-10-CM | POA: Diagnosis not present

## 2015-03-05 DIAGNOSIS — M4804 Spinal stenosis, thoracic region: Secondary | ICD-10-CM | POA: Diagnosis not present

## 2015-03-05 DIAGNOSIS — M47816 Spondylosis without myelopathy or radiculopathy, lumbar region: Secondary | ICD-10-CM

## 2015-03-05 DIAGNOSIS — M542 Cervicalgia: Secondary | ICD-10-CM | POA: Diagnosis present

## 2015-03-05 DIAGNOSIS — R700 Elevated erythrocyte sedimentation rate: Secondary | ICD-10-CM

## 2015-03-05 DIAGNOSIS — M810 Age-related osteoporosis without current pathological fracture: Secondary | ICD-10-CM | POA: Insufficient documentation

## 2015-03-05 DIAGNOSIS — R937 Abnormal findings on diagnostic imaging of other parts of musculoskeletal system: Secondary | ICD-10-CM

## 2015-03-05 DIAGNOSIS — M25511 Pain in right shoulder: Secondary | ICD-10-CM

## 2015-03-05 DIAGNOSIS — M5126 Other intervertebral disc displacement, lumbar region: Secondary | ICD-10-CM | POA: Diagnosis not present

## 2015-03-05 DIAGNOSIS — F1190 Opioid use, unspecified, uncomplicated: Secondary | ICD-10-CM

## 2015-03-05 DIAGNOSIS — M47812 Spondylosis without myelopathy or radiculopathy, cervical region: Secondary | ICD-10-CM | POA: Diagnosis not present

## 2015-03-05 DIAGNOSIS — E119 Type 2 diabetes mellitus without complications: Secondary | ICD-10-CM | POA: Insufficient documentation

## 2015-03-05 DIAGNOSIS — G5603 Carpal tunnel syndrome, bilateral upper limbs: Secondary | ICD-10-CM | POA: Diagnosis not present

## 2015-03-05 DIAGNOSIS — Z79891 Long term (current) use of opiate analgesic: Secondary | ICD-10-CM

## 2015-03-05 DIAGNOSIS — M5124 Other intervertebral disc displacement, thoracic region: Secondary | ICD-10-CM | POA: Diagnosis not present

## 2015-03-05 DIAGNOSIS — M549 Dorsalgia, unspecified: Secondary | ICD-10-CM | POA: Diagnosis present

## 2015-03-05 DIAGNOSIS — Z7984 Long term (current) use of oral hypoglycemic drugs: Secondary | ICD-10-CM | POA: Diagnosis not present

## 2015-03-05 DIAGNOSIS — E538 Deficiency of other specified B group vitamins: Secondary | ICD-10-CM | POA: Insufficient documentation

## 2015-03-05 DIAGNOSIS — M48061 Spinal stenosis, lumbar region without neurogenic claudication: Secondary | ICD-10-CM

## 2015-03-05 DIAGNOSIS — M791 Myalgia: Secondary | ICD-10-CM | POA: Insufficient documentation

## 2015-03-05 DIAGNOSIS — J449 Chronic obstructive pulmonary disease, unspecified: Secondary | ICD-10-CM | POA: Diagnosis not present

## 2015-03-05 DIAGNOSIS — M5127 Other intervertebral disc displacement, lumbosacral region: Secondary | ICD-10-CM | POA: Insufficient documentation

## 2015-03-05 DIAGNOSIS — Z9889 Other specified postprocedural states: Secondary | ICD-10-CM | POA: Insufficient documentation

## 2015-03-05 DIAGNOSIS — R7982 Elevated C-reactive protein (CRP): Secondary | ICD-10-CM

## 2015-03-05 DIAGNOSIS — M4806 Spinal stenosis, lumbar region: Secondary | ICD-10-CM | POA: Diagnosis not present

## 2015-03-05 DIAGNOSIS — M546 Pain in thoracic spine: Secondary | ICD-10-CM

## 2015-03-05 DIAGNOSIS — F419 Anxiety disorder, unspecified: Secondary | ICD-10-CM | POA: Diagnosis not present

## 2015-03-05 DIAGNOSIS — G8929 Other chronic pain: Secondary | ICD-10-CM | POA: Diagnosis not present

## 2015-03-05 DIAGNOSIS — I5189 Other ill-defined heart diseases: Secondary | ICD-10-CM | POA: Insufficient documentation

## 2015-03-05 DIAGNOSIS — M5134 Other intervertebral disc degeneration, thoracic region: Secondary | ICD-10-CM | POA: Diagnosis not present

## 2015-03-05 DIAGNOSIS — M79673 Pain in unspecified foot: Secondary | ICD-10-CM

## 2015-03-05 DIAGNOSIS — M79606 Pain in leg, unspecified: Secondary | ICD-10-CM

## 2015-03-05 DIAGNOSIS — E876 Hypokalemia: Secondary | ICD-10-CM | POA: Diagnosis not present

## 2015-03-05 DIAGNOSIS — Z7189 Other specified counseling: Secondary | ICD-10-CM

## 2015-03-05 DIAGNOSIS — Z5181 Encounter for therapeutic drug level monitoring: Secondary | ICD-10-CM

## 2015-03-05 MED ORDER — OXYCODONE-ACETAMINOPHEN 10-325 MG PO TABS: 1.0000 | ORAL_TABLET | Freq: Every day | ORAL | Status: DC | PRN

## 2015-03-05 NOTE — Telephone Encounter (Signed)
Has lost medicaid / has appt today for med refills, cannot come up with entire $125. Wants to know if she can come in

## 2015-03-05 NOTE — Progress Notes (Signed)
Safety precautions to be maintained throughout the outpatient stay will include: orient to surroundings, keep bed in low position, maintain call bell within reach at all times, provide assistance with transfer out of bed and ambulation.  Dr Dossie Arbour ordered a MRI back Pill remaining 0/150 oxycodone 10-362m  - was unable to get per medicaid- pt MD called and got it scheduled 03/06/15

## 2015-03-05 NOTE — Progress Notes (Signed)
Patient's Name: Lori Mckenzie MRN: [id number] DOB: 02-Nov-1965 DOS: 03/05/2015  Primary Reason(s) for Visit: Encounter for Medication Management CC: Back Pain; Hip Pain; and Neck Pain   HPI  Lori Mckenzie is a 50 y.o. year old, female patient, who returns today as an established patient. She has Type 2 diabetes mellitus ([place]); COPD (chronic obstructive pulmonary disease) (HCC); HTN (hypertension); GERD (gastroesophageal reflux disease); OSA on CPAP; Anxiety; Steatohepatitis; Bile leak, postoperative; Dysthymia; Other social stressor; Abdominal abscess ([place]); Ascites; NASH (nonalcoholic steatohepatitis); Hypokalemia; Pyrexia; Chronic pain; Opiate use (75 MME/Day); Long term prescription opiate use; Long term current use of opiate analgesic; Encounter for therapeutic drug level monitoring; Chronic epigastric abdominal pain; Chronic low back pain (Location of Primary Source of Pain) (Bilateral) (R>L); Chronic neck pain (Location of Tertiary source of pain) (Bilateral) (R>L); Breath shortness; Diastolic dysfunction; Clinical depression; Chronic obstructive pulmonary disease ([place]); Airway hyperreactivity; Anxiety state; Acid reflux; Essential (primary) hypertension; 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, L1-2, & L4-5); Lumbar and sacral osteoarthritis; Myofascial pain; Sleep apnea; Fall; Drowsiness; Episode of syncope; Subacute lumbar radiculopathy (left side) (S1 dermatome); Vitamin D deficiency; B12 deficiency; Folate deficiency; Thoracic radiculitis; Major depressive disorder with single episode ([place]); Elevated sedimentation rate; Elevated C-reactive protein (CRP); Lumbar facet syndrome (Location of Primary Source of Pain) (Bilateral) (R>L); Cervical spondylosis; Chronic feet pain (Location of Secondary source of pain) (Bilateral) (R>L); Lumbar spondylosis; Encounter for chronic pain management; Chronic shoulder pain (Bilateral) (R>L); Chronic carpal tunnel syndrome (Bilateral); Chronic hip pain (Bilateral) (L>R);  Chronic upper back pain (Bilateral) (L>R); Osteoporosis, idiopathic; and Abnormal MRI, lumbar spine (02/03/2015) on her problem list.. Her primarily concern today is the Back Pain; Hip Pain; and Neck Pain   The patient comes in today clinics today for pharmacological management of her chronic pain. Apparently due to Medicaid problems she was unable to get her MRI done and she has is scheduled for tomorrow. The blood work that we did on the patient came back with elevated sedimentation rate and C-reactive protein therefore we will look into her ANA and rheumatoid factor. In addition, she was found to have a vitamin D deficiency and a vitamin B12 deficiency. The vitamin D deficiency can give her joint and muscle pain and the B12 deficiency can contribute to neuropathies. In addition, her potassium level were low which can also contribute to generalized weakness. This sedimentation rate and C-reactive protein were both elevated which are markers of inflammatory processes.  Pain Assessment: Self-Reported Pain Score: 8 , clinically she looks like a 3/10. Reported level is inconsistent with clinical obrservations Pain Type: Chronic pain Pain Location: Back Pain Orientation: Lower Pain Descriptors / Indicators: Burning, Radiating, Stabbing Pain Frequency: Constant  Date of Last Visit: 02/05/15 Service Provided on Last Visit: Med Refill  Controlled Substance Pharmacotherapy Assessment  Analgesic: Oxycodone/APAP 10/325 5 tablets per day (50 mg/day) MME/day: 75 mg/day Pharmacokinetics: Onset of action (Liberation/Absorption): Within expected pharmacological parameters. (15 minutes) Time to Peak effect (Distribution): Timing and results are as within normal expected parameters. (One hour) Duration of action (Metabolism/Excretion): Within normal limits for medication. (3-4 hours) Pharmacodynamics: Analgesic Effect: 10-20% Activity Facilitation: Medication(s) allow patient to sit, stand, walk, and do  the basic ADLs Perceived Effectiveness: Described as relatively effective, allowing for increase in activities of daily living (ADL) Side-effects or Adverse reactions: None reported Monitoring: [place] PMP: Compliant with practice rules and regulations UDS Results/interpretation: The patient's last UDS was done on 02/05/2015. The results were read as unexpected since they did detect alprazolam, which the patient had  not declare it. Today I took the opportunity to talk to her about the CDC guidelines and how they recommend not to use benzodiazepines in combination with opioids. Medication Assessment Form: Reviewed. Patient indicates being compliant with therapy Treatment compliance: Compliant Risk Assessment: Aberrant Behavior: None observed today Substance Use Disorder (SUD) Risk Level: Moderate Opioid Risk Tool (ORT) Score: Total Score: 5 Moderate Risk for SUD (Score between 4-7) Depression Scale Score: PHQ-2:   PHQ-9:   Pharmacologic Plan: Continue therapy as is  Lab Work: Illicit Drugs Lab Results  Component Value Date   THCU NEGATIVE 08/26/2013   PCPSCRNUR NEGATIVE 08/26/2013   MDMA NEGATIVE 08/26/2013   AMPHETMU NEGATIVE 08/26/2013   METHADONE NEGATIVE 08/26/2013   ETOH < 3 10/18/2011    Inflammation Markers Lab Results  Component Value Date   ESRSEDRATE 35* 01/08/2015   CRP 2.8* 01/08/2015    Renal Function Lab Results  Component Value Date   BUN 11 01/08/2015   CREATININE 0.61 01/08/2015   GFRAA >60 01/08/2015   GFRNONAA >60 01/08/2015    Hepatic Function Lab Results  Component Value Date   AST 38 01/08/2015   ALT 46 01/08/2015   ALBUMIN 4.6 01/08/2015    Electrolytes Lab Results  Component Value Date   NA 142 01/08/2015   K 3.4* 01/08/2015   CL 102 01/08/2015   CALCIUM 9.3 01/08/2015   MG 1.9 01/08/2015    Allergies  Lori Mckenzie is allergic to tape and vicodin.  Meds  The patient has a current medication list which includes the following  prescription(s): albuterol, alprazolam, citalopram, cyanocobalamin, dicyclomine, famotidine, fluticasone, intrinsi b12-folate, furosemide, gabapentin, hydrochlorothiazide, lactulose, loperamide, metformin, nystatin, omeprazole, ondansetron, oxycodone-acetaminophen, promethazine, rifaximin, rizatriptan, spironolactone, and aspirin ec.  Current Outpatient Prescriptions on File Prior to Visit  Medication Sig  . albuterol (PROVENTIL HFA;VENTOLIN HFA) 108 (90 BASE) MCG/ACT inhaler Inhale 2 puffs into the lungs every 4 (four) hours as needed for wheezing or shortness of breath.  . ALPRAZolam (XANAX) 1 MG tablet Take 1 tablet (1 mg total) by mouth 3 (three) times daily as needed for anxiety. (Patient taking differently: Take 2 mg by mouth 3 (three) times daily as needed. )  . citalopram (CELEXA) 20 MG tablet Take 20 mg by mouth at bedtime.   . cyanocobalamin ([employer] VITAMIN B12) 2000 MCG tablet Take 1 tablet (2,000 mcg total) by mouth daily.  Marland Kitchen dicyclomine (BENTYL) 10 MG capsule Take 10 mg by mouth 4 (four) times daily -  before meals and at bedtime.   . famotidine (PEPCID) 20 MG tablet Take 20 mg by mouth.  . fluticasone (FLONASE) 50 MCG/ACT nasal spray Place 1 spray into both nostrils daily as needed for rhinitis.   Derald Macleod Factor (INTRINSI B12-FOLATE) 409-811-91 MCG-MCG-MG TABS Take 500 mcg by mouth daily.  . furosemide (LASIX) 40 MG tablet Take 40 mg by mouth 2 (two) times daily.   Marland Kitchen gabapentin (NEURONTIN) 300 MG capsule Take 600 mg by mouth 3 (three) times daily.   . hydrochlorothiazide (HYDRODIURIL) 25 MG tablet Take 25 mg by mouth daily. On scale based on b/p  . lactulose (CEPHULAC) 20 g packet Take 20 g by mouth 2 (two) times daily.   Marland Kitchen loperamide (IMODIUM) 2 MG capsule Take 1 capsule (2 mg total) by mouth every 6 (six) hours as needed for diarrhea or loose stools.  . metFORMIN (GLUCOPHAGE) 1000 MG tablet Take 1,000 mg by mouth 2 (two) times daily with a meal. On sliding scale  .  nystatin (MYCOSTATIN)  powder Apply topically.  Marland Kitchen omeprazole (PRILOSEC) 20 MG capsule Take 20 mg by mouth 2 (two) times daily before a meal.  . ondansetron (ZOFRAN) 4 MG tablet Take 1 tablet (4 mg total) by mouth every 6 (six) hours as needed for nausea.  . promethazine (PHENERGAN) 12.5 MG tablet Take 12.5 mg by mouth every 6 (six) hours.  . rifaximin (XIFAXAN) 550 MG TABS tablet Take 550 mg by mouth 2 (two) times daily. Reported on 02/05/2015  . rizatriptan (MAXALT) 10 MG tablet Take 10 mg by mouth as needed for migraine.   Marland Kitchen spironolactone (ALDACTONE) 100 MG tablet Take 1 tablet by mouth daily.  Marland Kitchen aspirin EC 81 MG tablet Take 81 mg by mouth daily. Reported on 03/05/2015  . [DISCONTINUED] SUMAtriptan (IMITREX) 50 MG tablet Take 50 mg by mouth every 2 (two) hours as needed. For migraines   No current facility-administered medications on file prior to visit.    ROS  Constitutional: Afebrile, no chills, well hydrated and well nourished Gastrointestinal: negative Musculoskeletal:negative Neurological: negative Behavioral/Psych: negative  PFSH  Medical:  Lori Mckenzie  has a past medical history of Brittle bone disease; Asthma; Thyroid disease; Hypertension; Back pain; Diabetes mellitus without complication ([place]); Chronic kidney disease; Anxiety; GERD (gastroesophageal reflux disease); Sleep apnea; COPD (chronic obstructive pulmonary disease) ([place]); Hypothyroidism; Cervical disc disease; TIA (transient ischemic attack); Cancer ([place]); Collagen vascular disease ([place]); Ascites; NASH (nonalcoholic steatohepatitis); NASH (nonalcoholic steatohepatitis); Sleep apnea; Migraines; and Respiratory infection. Family: family history includes Heart disease in her brother and sister; Lung cancer in her father; Ulcers in her father and sister. Surgical:  has past surgical history that includes Abdominal hysterectomy; Tubal ligation; Colonoscopy with propofol (N/A, 06/23/2014); Esophagogastroduodenoscopy (N/A, 06/23/2014);  Cholecystectomy (N/A, 07/15/2014); ERCP (N/A, 07/16/2014); Wisdom tooth extraction; and ERCP (N/A, 10/03/2014). Tobacco:  reports that she has never smoked. She has never used smokeless tobacco. Alcohol:  reports that she does not drink alcohol. Drug:  reports that she does not use illicit drugs.  Physical Exam  Vitals:  Today's Vitals   03/05/15 1318 03/05/15 1319  BP: 98/60   Pulse: 96   Temp: 97.9 F (36.6 C)   TempSrc: Oral   Resp: 16   Height: 5' 2"  (1.575 m)   Weight: 208 lb (94.348 kg)   SpO2: 97%   PainSc:  8     Calculated BMI: Body mass index is 38.03 kg/(m^2).  General appearance: alert, cooperative, appears stated age, no distress and morbidly obese Eyes: PERLA Respiratory: No evidence respiratory distress, no audible rales or ronchi and no use of accessory muscles of respiration  Cervical Spine Inspection: Normal anatomy Alignment: Symetrical ROM: Adequate  Upper Extremities Inspection: No gross anomalies detected ROM: Adequate Sensory: Normal Motor: Unremarkable  Thoracic Spine Inspection: No gross anomalies detected Alignment: Symetrical ROM: Adequate  Lumbar Spine Inspection: No gross anomalies detected Alignment: Symetrical ROM: Adequate  Gait: WNL  Lower Extremities Inspection: No gross anomalies detected ROM: Adequate Sensory:  Normal Motor: Unremarkable  Assessment & Plan  Primary Diagnosis & Pertinent Problem List: The primary encounter diagnosis was Chronic pain. Diagnoses of Encounter for therapeutic drug level monitoring, Long term current use of opiate analgesic, Elevated sedimentation rate, Elevated C-reactive protein (CRP), Lumbar facet syndrome (Location of Primary Source of Pain) (Bilateral) (R>L), Chronic pain of lower extremity, unspecified laterality, Cervical spondylosis, Chronic foot pain, unspecified laterality, Lumbar spondylosis, unspecified spinal osteoarthritis, Encounter for chronic pain management, Opiate use (75  MME/Day), Chronic shoulder pain (Bilateral) (R>L), Chronic carpal tunnel syndrome (Bilateral),  Chronic hip pain, unspecified laterality, Chronic upper back pain (Bilateral) (L>R), Osteoporosis, idiopathic, Abnormal MRI, lumbar spine (02/03/2015), and Lumbar central canal stenosis were also pertinent to this visit.  Visit Diagnosis: 1. Chronic pain   2. Encounter for therapeutic drug level monitoring   3. Long term current use of opiate analgesic   4. Elevated sedimentation rate   5. Elevated C-reactive protein (CRP)   6. Lumbar facet syndrome (Location of Primary Source of Pain) (Bilateral) (R>L)   7. Chronic pain of lower extremity, unspecified laterality   8. Cervical spondylosis   9. Chronic foot pain, unspecified laterality   10. Lumbar spondylosis, unspecified spinal osteoarthritis   11. Encounter for chronic pain management   12. Opiate use (75 MME/Day)   13. Chronic shoulder pain (Bilateral) (R>L)   14. Chronic carpal tunnel syndrome (Bilateral)   15. Chronic hip pain, unspecified laterality   16. Chronic upper back pain (Bilateral) (L>R)   17. Osteoporosis, idiopathic   18. Abnormal MRI, lumbar spine (02/03/2015)   19. Lumbar central canal stenosis     Problem-specific Plan(s): Abnormal MRI, lumbar spine (02/03/2015) EXAM: MRI LUMBAR SPINE WITHOUT CONTRAST  TECHNIQUE: Multiplanar, multisequence MR imaging of the lumbar spine was performed. No intravenous contrast was administered.  COMPARISON: CT Abdomen and Pelvis 01/29/2015 and earlier  FINDINGS: Normal lumbar segmentation demonstrated on the comparison. Straightening of lumbar lordosis, otherwise normal vertebral height and alignment. Bone marrow signal within normal limits. No marrow edema or evidence of acute osseous abnormality. Visible sacrum intact.  No signal abnormality in the visualized lower thoracic spinal cord. Conus medullaris appears normal at L1-L2.  Decreased T2 signal in the visible liver  and spleen. Stable visualized abdominal viscera, small retroperitoneal lymph nodes. Negative visualized posterior paraspinal soft tissues.  T10-T11: Bulky but incompletely visualized central disc extrusion suggested at T10-T11 (series 2, image 8). This disc herniation is seen to be partially calcified on the recent CT Abdomen and Pelvis comparison. Those images suggest moderate associated lower thoracic spinal stenosis.  T11-T12: Small central disc protrusion. Mild to moderate left facet hypertrophy. Moderate left T11 foraminal stenosis.  T12-L1: Right paracentral disc protrusion with no significant stenosis.  L1-L2: Left paracentral and caudal disc extrusion (series 5, image 10) is mild. Borderline to mild spinal stenosis at this level without foraminal involvement.  L2-L3: Minimal disc bulge. Mild facet hypertrophy. No stenosis.  L3-L4: Mild disc bulge. Moderate facet hypertrophy. No stenosis.  L4-L5: Mild circumferential disc bulge. Moderate to severe facet hypertrophy. Borderline to mild spinal stenosis (series 5, image 29). No convincing lateral recess or foraminal stenosis.  L5-S1: Mild disc bulge and facet hypertrophy. No stenosis.  IMPRESSION: 1. Disc degeneration maximal in the lower thoracic and upper lumbar spine. Suspect up to moderate lower thoracic spinal stenosis at T10-T11 related to a partially calcified disc herniation which is partially visible on this study. Follow-up thoracic spine MRI would evaluate further as necessary. 2. Mild multifactorial lumbar spinal stenosis at L1-L2 and L4-L5. 3. Moderate left T11 neural foraminal stenosis. No lumbar foraminal stenosis. 4. No acute osseous abnormality in the lumbar spine.   Electronically Signed  By: Genevie Ann M.D.  On: 02/03/2015 11:42  Lumbar spondylosis T10-11: Central disc extrusion, partially calcified; Moderate lower thoracic spinal stenosis. T11-12: Central disc protrusion; left  facet hypertrophy; left T11 foraminal stenosis. T12-L1: Right paracentral disc protrusion. L1-2: Left paracentral and caudal disc extrusion; spinal stenosis at this level. L2-3: Disc bulge; facet hypertrophy. L3-4: Disc bulge; facet hypertrophy. L4-5: Circumferential disc bulge;  severe facet hypertrophy; spinal stenosis. L5-S1: Disc bulge and facet hypertrophy.  Lumbar facet syndrome (Location of Primary Source of Pain) (Bilateral) (R>L) Facet Hypertrophy: T11-12 (Left), L2-3, L3-4, L4-5 (Severe), & L5-S1 (Bilateral).    Plan of Care  Pharmacotherapy (Medications Ordered): Meds ordered this encounter  Medications  . oxyCODONE-acetaminophen (PERCOCET) 10-325 MG tablet    Sig: Take 1 tablet by mouth 5 (five) times daily as needed for pain. Do not take for 11 hours after taking Xanax. Max.: 5/day.    Dispense:  150 tablet    Refill:  0    Do not place this medication, or any other prescription from our practice, on "Automatic Refill". Patient may have prescription filled one day early if pharmacy is closed on scheduled refill date. Do not fill until: 03/07/15 To last until: 04/06/15    [facility] & Procedure Ordered: Orders Placed This Encounter  Procedures  . ToxASSURE Select 13 (MW), Urine    Volume: 30 ml(s). Minimum 3 ml of urine is needed. Document temperature of fresh sample. Indications: Long term (current) use of opiate analgesic (Z79.891)  . ANA Comprehensive Panel    Standing Status: Future     Number of Occurrences: 1     Standing Expiration Date: 04/04/2015  . Rheumatoid Arthritis Profile    Standing Status: Future     Number of Occurrences: 1     Standing Expiration Date: 04/04/2015    Imaging Ordered: None  Interventional Therapies: Scheduled: None at this point. PRN Procedures: Possible bilateral diagnostic lumbar facet block under fluoroscopic guidance and IV sedation.    Referral(s) or Consult(s): None at this point.  Medications administered during this  visit: Lori Mckenzie had no medications administered during this visit.  Future Appointments Date Time Provider [place]  03/06/2015 11:00 AM [facility] 1 [facility]  04/06/2015 9:00 AM Milinda Pointer, MD [facility] None    Primary Care Physician: Ardine Eng, MD Location: [facility] Facility Note by: Kathlen Brunswick. Dossie Arbour, M.D, DABA, DABAPM, DABPM, DABIPP, FIPP  Pain Score Disclaimer: We use the NRS-11 scale. This is a self-reported, subjective measurement of pain severity with only modest accuracy. It is used primarily to identify changes within a particular patient. It must be understood that outpatient pain scales are significantly less accurate that those used for research, where they can be applied under ideal controlled circumstances with minimal exposure to variables. In reality, the score is likely to be a combination of pain intensity and pain affect, where pain affect describes the degree of emotional arousal or changes in action readiness caused by the sensory experience of pain. Factors such as social and work situation, setting, emotional state, anxiety levels, expectation, and prior pain experience may influence pain perception and show large inter-individual differences that may also be affected by time variables.

## 2015-03-05 NOTE — Assessment & Plan Note (Signed)
EXAM: MRI LUMBAR SPINE WITHOUT CONTRAST  TECHNIQUE: Multiplanar, multisequence MR imaging of the lumbar spine was performed. No intravenous contrast was administered.  COMPARISON: CT Abdomen and Pelvis 01/29/2015 and earlier  FINDINGS: Normal lumbar segmentation demonstrated on the comparison. Straightening of lumbar lordosis, otherwise normal vertebral height and alignment. Bone marrow signal within normal limits. No marrow edema or evidence of acute osseous abnormality. Visible sacrum intact.  No signal abnormality in the visualized lower thoracic spinal cord. Conus medullaris appears normal at L1-L2.  Decreased T2 signal in the visible liver and spleen. Stable visualized abdominal viscera, small retroperitoneal lymph nodes. Negative visualized posterior paraspinal soft tissues.  T10-T11: Bulky but incompletely visualized central disc extrusion suggested at T10-T11 (series 2, image 8). This disc herniation is seen to be partially calcified on the recent CT Abdomen and Pelvis comparison. Those images suggest moderate associated lower thoracic spinal stenosis.  T11-T12: Small central disc protrusion. Mild to moderate left facet hypertrophy. Moderate left T11 foraminal stenosis.  T12-L1: Right paracentral disc protrusion with no significant stenosis.  L1-L2: Left paracentral and caudal disc extrusion (series 5, image 10) is mild. Borderline to mild spinal stenosis at this level without foraminal involvement.  L2-L3: Minimal disc bulge. Mild facet hypertrophy. No stenosis.  L3-L4: Mild disc bulge. Moderate facet hypertrophy. No stenosis.  L4-L5: Mild circumferential disc bulge. Moderate to severe facet hypertrophy. Borderline to mild spinal stenosis (series 5, image 29). No convincing lateral recess or foraminal stenosis.  L5-S1: Mild disc bulge and facet hypertrophy. No stenosis.  IMPRESSION: 1. Disc degeneration maximal in the lower thoracic  and upper lumbar spine. Suspect up to moderate lower thoracic spinal stenosis at T10-T11 related to a partially calcified disc herniation which is partially visible on this study. Follow-up thoracic spine MRI would evaluate further as necessary. 2. Mild multifactorial lumbar spinal stenosis at L1-L2 and L4-L5. 3. Moderate left T11 neural foraminal stenosis. No lumbar foraminal stenosis. 4. No acute osseous abnormality in the lumbar spine.   Electronically Signed  By: Genevie Ann M.D.  On: 02/03/2015 11:42

## 2015-03-05 NOTE — Assessment & Plan Note (Signed)
T10-11: Central disc extrusion, partially calcified; Moderate lower thoracic spinal stenosis. T11-12: Central disc protrusion; left facet hypertrophy; left T11 foraminal stenosis. T12-L1: Right paracentral disc protrusion. L1-2: Left paracentral and caudal disc extrusion; spinal stenosis at this level. L2-3: Disc bulge; facet hypertrophy. L3-4: Disc bulge; facet hypertrophy. L4-5: Circumferential disc bulge; severe facet hypertrophy; spinal stenosis. L5-S1: Disc bulge and facet hypertrophy.

## 2015-03-05 NOTE — Assessment & Plan Note (Signed)
Facet Hypertrophy: T11-12 (Left), L2-3, L3-4, L4-5 (Severe), & L5-S1 (Bilateral).

## 2015-03-06 ENCOUNTER — Ambulatory Visit
Admission: RE | Admit: 2015-03-06 | Discharge: 2015-03-06 | Disposition: A | Discharge status: Home / Self Care | Payer: Medicaid Other | Source: Ambulatory Visit | Attending: Pain Medicine | Admitting: Pain Medicine

## 2015-03-06 DIAGNOSIS — K769 Liver disease, unspecified: Secondary | ICD-10-CM | POA: Insufficient documentation

## 2015-03-06 DIAGNOSIS — M47814 Spondylosis without myelopathy or radiculopathy, thoracic region: Secondary | ICD-10-CM | POA: Insufficient documentation

## 2015-03-06 DIAGNOSIS — D1809 Hemangioma of other sites: Secondary | ICD-10-CM | POA: Insufficient documentation

## 2015-03-06 DIAGNOSIS — M5414 Radiculopathy, thoracic region: Secondary | ICD-10-CM | POA: Insufficient documentation

## 2015-03-06 DIAGNOSIS — M5134 Other intervertebral disc degeneration, thoracic region: Secondary | ICD-10-CM | POA: Insufficient documentation

## 2015-03-06 LAB — ANA COMPREHENSIVE PANEL
Anti JO-1: <0.2 AI (ref 0.0–0.9)
Centromere Ab Screen: <0.2 AI (ref 0.0–0.9)
Chromatin Ab SerPl-aCnc: <0.2 AI (ref 0.0–0.9)
ENA SM Ab Ser-aCnc: <0.2 AI (ref 0.0–0.9)
Ribonucleic Protein: <0.2 AI (ref 0.0–0.9)
SSA (Ro) (ENA) Antibody, IgG: <0.2 AI (ref 0.0–0.9)
SSB (La) (ENA) Antibody, IgG: <0.2 AI (ref 0.0–0.9)
Scleroderma (Scl-70) (ENA) Antibody, IgG: <0.2 AI (ref 0.0–0.9)
ds DNA Ab: 2 IU/mL (ref 0–9)

## 2015-03-06 LAB — RHEUMATOID ARTHRITIS PROFILE
CCP Antibodies IgG/IgA: 7 units (ref 0–19)
Rhuematoid fact SerPl-aCnc: <10 IU/mL (ref 0.0–13.9)

## 2015-03-11 ENCOUNTER — Inpatient Hospital Stay
Admission: EM | Admit: 2015-03-11 | Discharge: 2015-03-13 | DRG: 442 | Disposition: A | Discharge status: Home Health | Payer: Medicaid Other | Attending: Internal Medicine | Admitting: Internal Medicine

## 2015-03-11 ENCOUNTER — Emergency Department: Payer: Medicaid Other

## 2015-03-11 ENCOUNTER — Encounter: Payer: Self-pay | Admitting: Emergency Medicine

## 2015-03-11 DIAGNOSIS — M5414 Radiculopathy, thoracic region: Secondary | ICD-10-CM | POA: Diagnosis present

## 2015-03-11 DIAGNOSIS — K219 Gastro-esophageal reflux disease without esophagitis: Secondary | ICD-10-CM | POA: Diagnosis present

## 2015-03-11 DIAGNOSIS — Z885 Allergy status to narcotic agent status: Secondary | ICD-10-CM

## 2015-03-11 DIAGNOSIS — E1122 Type 2 diabetes mellitus with diabetic chronic kidney disease: Secondary | ICD-10-CM | POA: Diagnosis present

## 2015-03-11 DIAGNOSIS — G43909 Migraine, unspecified, not intractable, without status migrainosus: Secondary | ICD-10-CM | POA: Diagnosis present

## 2015-03-11 DIAGNOSIS — M549 Dorsalgia, unspecified: Secondary | ICD-10-CM | POA: Diagnosis present

## 2015-03-11 DIAGNOSIS — Z888 Allergy status to other drugs, medicaments and biological substances status: Secondary | ICD-10-CM | POA: Diagnosis not present

## 2015-03-11 DIAGNOSIS — G8929 Other chronic pain: Secondary | ICD-10-CM | POA: Diagnosis present

## 2015-03-11 DIAGNOSIS — F411 Generalized anxiety disorder: Secondary | ICD-10-CM | POA: Diagnosis present

## 2015-03-11 DIAGNOSIS — Z79891 Long term (current) use of opiate analgesic: Secondary | ICD-10-CM

## 2015-03-11 DIAGNOSIS — K7682 Hepatic encephalopathy: Secondary | ICD-10-CM | POA: Diagnosis present

## 2015-03-11 DIAGNOSIS — E559 Vitamin D deficiency, unspecified: Secondary | ICD-10-CM | POA: Diagnosis present

## 2015-03-11 DIAGNOSIS — Z7951 Long term (current) use of inhaled steroids: Secondary | ICD-10-CM | POA: Diagnosis not present

## 2015-03-11 DIAGNOSIS — G4730 Sleep apnea, unspecified: Secondary | ICD-10-CM | POA: Diagnosis present

## 2015-03-11 DIAGNOSIS — M4806 Spinal stenosis, lumbar region: Secondary | ICD-10-CM | POA: Diagnosis present

## 2015-03-11 DIAGNOSIS — M5080 Other cervical disc disorders, unspecified cervical region: Secondary | ICD-10-CM | POA: Diagnosis present

## 2015-03-11 DIAGNOSIS — I129 Hypertensive chronic kidney disease with stage 1 through stage 4 chronic kidney disease, or unspecified chronic kidney disease: Secondary | ICD-10-CM | POA: Diagnosis present

## 2015-03-11 DIAGNOSIS — K7581 Nonalcoholic steatohepatitis (NASH): Secondary | ICD-10-CM | POA: Diagnosis present

## 2015-03-11 DIAGNOSIS — M818 Other osteoporosis without current pathological fracture: Secondary | ICD-10-CM | POA: Diagnosis present

## 2015-03-11 DIAGNOSIS — R1084 Generalized abdominal pain: Secondary | ICD-10-CM | POA: Diagnosis present

## 2015-03-11 DIAGNOSIS — E039 Hypothyroidism, unspecified: Secondary | ICD-10-CM | POA: Diagnosis present

## 2015-03-11 DIAGNOSIS — K7290 Hepatic failure, unspecified without coma: Secondary | ICD-10-CM | POA: Diagnosis present

## 2015-03-11 DIAGNOSIS — Z7984 Long term (current) use of oral hypoglycemic drugs: Secondary | ICD-10-CM

## 2015-03-11 DIAGNOSIS — Z7982 Long term (current) use of aspirin: Secondary | ICD-10-CM | POA: Diagnosis not present

## 2015-03-11 DIAGNOSIS — R519 Headache, unspecified: Secondary | ICD-10-CM

## 2015-03-11 DIAGNOSIS — E538 Deficiency of other specified B group vitamins: Secondary | ICD-10-CM | POA: Diagnosis present

## 2015-03-11 DIAGNOSIS — Q780 Osteogenesis imperfecta: Secondary | ICD-10-CM

## 2015-03-11 DIAGNOSIS — J45909 Unspecified asthma, uncomplicated: Secondary | ICD-10-CM | POA: Diagnosis present

## 2015-03-11 DIAGNOSIS — N189 Chronic kidney disease, unspecified: Secondary | ICD-10-CM | POA: Diagnosis present

## 2015-03-11 DIAGNOSIS — J449 Chronic obstructive pulmonary disease, unspecified: Secondary | ICD-10-CM | POA: Diagnosis present

## 2015-03-11 DIAGNOSIS — K7460 Unspecified cirrhosis of liver: Secondary | ICD-10-CM | POA: Diagnosis present

## 2015-03-11 DIAGNOSIS — F419 Anxiety disorder, unspecified: Secondary | ICD-10-CM | POA: Diagnosis present

## 2015-03-11 DIAGNOSIS — G4733 Obstructive sleep apnea (adult) (pediatric): Secondary | ICD-10-CM | POA: Diagnosis present

## 2015-03-11 DIAGNOSIS — R51 Headache: Secondary | ICD-10-CM

## 2015-03-11 DIAGNOSIS — R109 Unspecified abdominal pain: Secondary | ICD-10-CM

## 2015-03-11 DIAGNOSIS — Z8673 Personal history of transient ischemic attack (TIA), and cerebral infarction without residual deficits: Secondary | ICD-10-CM

## 2015-03-11 DIAGNOSIS — Z8542 Personal history of malignant neoplasm of other parts of uterus: Secondary | ICD-10-CM

## 2015-03-11 DIAGNOSIS — Z79899 Other long term (current) drug therapy: Secondary | ICD-10-CM | POA: Diagnosis not present

## 2015-03-11 DIAGNOSIS — R7989 Other specified abnormal findings of blood chemistry: Secondary | ICD-10-CM

## 2015-03-11 LAB — COMPREHENSIVE METABOLIC PANEL
ALT: 62 U/L — ABNORMAL HIGH (ref 14–54)
AST: 61 U/L — ABNORMAL HIGH (ref 15–41)
Albumin: 4.2 g/dL (ref 3.5–5.0)
Alkaline Phosphatase: 78 U/L (ref 38–126)
Anion gap: 10 (ref 5–15)
BUN: 14 mg/dL (ref 6–20)
CO2: 27 mmol/L (ref 22–32)
Calcium: 9.5 mg/dL (ref 8.9–10.3)
Chloride: 99 mmol/L — ABNORMAL LOW (ref 101–111)
Creatinine, Ser: 0.58 mg/dL (ref 0.44–1.00)
GFR calc Af Amer: >60 mL/min (ref >60)
GFR calc non Af Amer: >60 mL/min (ref >60)
Glucose, Bld: 199 mg/dL — ABNORMAL HIGH (ref 65–99)
Potassium: 3.6 mmol/L (ref 3.5–5.1)
Sodium: 136 mmol/L (ref 135–145)
Total Bilirubin: 0.7 mg/dL (ref 0.3–1.2)
Total Protein: 8 g/dL (ref 6.5–8.1)

## 2015-03-11 LAB — CBC WITH DIFFERENTIAL/PLATELET
Basophils Absolute: 0 10*3/uL (ref 0–0.1)
Basophils Relative: 1 %
Eosinophils Absolute: 0 10*3/uL (ref 0–0.7)
Eosinophils Relative: 1 %
HCT: 39 % (ref 35.0–47.0)
Hemoglobin: 13.7 g/dL (ref 12.0–16.0)
Lymphocytes Relative: 23 %
Lymphs Abs: 1.4 10*3/uL (ref 1.0–3.6)
MCH: 34.2 pg — ABNORMAL HIGH (ref 26.0–34.0)
MCHC: 35.1 g/dL (ref 32.0–36.0)
MCV: 97.2 fL (ref 80.0–100.0)
Monocytes Absolute: 0.4 10*3/uL (ref 0.2–0.9)
Monocytes Relative: 6 %
Neutro Abs: 4.4 10*3/uL (ref 1.4–6.5)
Neutrophils Relative %: 69 %
Platelets: 128 10*3/uL — ABNORMAL LOW (ref 150–440)
RBC: 4.01 MIL/uL (ref 3.80–5.20)
RDW: 13.9 % (ref 11.5–14.5)
WBC: 6.3 10*3/uL (ref 3.6–11.0)

## 2015-03-11 LAB — URINALYSIS COMPLETE WITH MICROSCOPIC (ARMC ONLY)
Bacteria, UA: NONE SEEN
Bilirubin Urine: NEGATIVE
Glucose, UA: NEGATIVE mg/dL
Hgb urine dipstick: NEGATIVE
Ketones, ur: NEGATIVE mg/dL
Leukocytes, UA: NEGATIVE
Nitrite: NEGATIVE
Protein, ur: NEGATIVE mg/dL
RBC / HPF: NONE SEEN RBC/hpf (ref 0–5)
Specific Gravity, Urine: 1.013 (ref 1.005–1.030)
WBC, UA: NONE SEEN WBC/hpf (ref 0–5)
pH: 7 (ref 5.0–8.0)

## 2015-03-11 LAB — TROPONIN I: Troponin I: <0.03 ng/mL (ref <0.031)

## 2015-03-11 LAB — AMMONIA: Ammonia: 59 umol/L — ABNORMAL HIGH (ref 9–35)

## 2015-03-11 LAB — LIPASE, BLOOD: Lipase: 22 U/L (ref 11–51)

## 2015-03-11 MED ORDER — IOHEXOL 240 MG/ML SOLN
25.0000 mL | Freq: Once | INTRAMUSCULAR | Status: AC | PRN
  Administered 2015-03-11: 25 mL via ORAL

## 2015-03-11 MED ORDER — DIPHENHYDRAMINE HCL 50 MG/ML IJ SOLN
25.0000 mg | Freq: Once | INTRAMUSCULAR | Status: AC
  Administered 2015-03-11: 25 mg via INTRAVENOUS
  Filled 2015-03-11: qty 1

## 2015-03-11 MED ORDER — KETOROLAC TROMETHAMINE 30 MG/ML IJ SOLN
30.0000 mg | Freq: Once | INTRAMUSCULAR | Status: AC
  Administered 2015-03-11: 30 mg via INTRAVENOUS
  Filled 2015-03-11: qty 1

## 2015-03-11 MED ORDER — HYDROMORPHONE HCL 1 MG/ML IJ SOLN
1.0000 mg | Freq: Once | INTRAMUSCULAR | Status: AC
  Administered 2015-03-11: 1 mg via INTRAVENOUS
  Filled 2015-03-11: qty 1

## 2015-03-11 MED ORDER — SODIUM CHLORIDE 0.9 % IV BOLUS (SEPSIS)
1000.0000 mL | Freq: Once | INTRAVENOUS | Status: AC
  Administered 2015-03-11: 1000 mL via INTRAVENOUS

## 2015-03-11 MED ORDER — ONDANSETRON HCL 4 MG/2ML IJ SOLN
4.0000 mg | Freq: Once | INTRAMUSCULAR | Status: AC
Start: 2015-03-11 — End: 2015-03-11
  Administered 2015-03-11: 4 mg via INTRAVENOUS
  Filled 2015-03-11: qty 2

## 2015-03-11 MED ORDER — METOCLOPRAMIDE HCL 5 MG/ML IJ SOLN
10.0000 mg | Freq: Once | INTRAMUSCULAR | Status: AC
  Administered 2015-03-11: 10 mg via INTRAVENOUS
  Filled 2015-03-11: qty 2

## 2015-03-11 MED ORDER — LACTULOSE 10 GM/15ML PO SOLN
20.0000 g | Freq: Once | ORAL | Status: AC
  Administered 2015-03-11: 20 g via ORAL
  Filled 2015-03-11: qty 30

## 2015-03-11 MED ORDER — IOHEXOL 300 MG/ML  SOLN
100.0000 mL | Freq: Once | INTRAMUSCULAR | Status: AC | PRN
  Administered 2015-03-11: 100 mL via INTRAVENOUS

## 2015-03-11 NOTE — H&P (Signed)
[facility] NAME: Lori Mckenzie    MR#:  [id number]  DATE OF BIRTH:  [birth year]  DATE OF ADMISSION:  03/11/2015  PRIMARY CARE PHYSICIAN: Ashkin, Neldon Labella, MD   REQUESTING/REFERRING PHYSICIAN: Dr. Carrie Mew  CHIEF COMPLAINT:   Chief Complaint  Patient presents with  . Abdominal Pain  . Back Pain    HISTORY OF PRESENT ILLNESS:  Lori Mckenzie  is a 50 y.o. female with a known history of chronic back pain and chronic abdominal pain, nonalcoholic steatohepatitis with history of hepatic encephalopathy, GERD, diabetes and hypertension presents from home secondary to increased confusion for the last 2 days. Patient states whenever her ammonia level goes high, she gets very confused. She was actually mumbling and incoherent when she came in. Ammonia level was elevated at 59. Patient has been taking her lactulose and rifaximin at home twice a day. She does have chronic abdominal pain and that has been stable. Also has chronic back pain and is on several opioids and she actually states that she has been cutting down. She follows with pain management clinic. Also takes Xanax at home. She was having intense headache when she initially came in, likely looks like migraine attack.  PAST MEDICAL HISTORY:   Past Medical History  Diagnosis Date  . Brittle bone disease   . Asthma   . Thyroid disease   . Hypertension   . Back pain   . Diabetes mellitus without complication ([place])   . Chronic kidney disease   . Anxiety   . GERD (gastroesophageal reflux disease)   . Sleep apnea   . COPD (chronic obstructive pulmonary disease) ([place])   . Hypothyroidism   . Cervical disc disease   . TIA (transient ischemic attack)   . Cancer ([place])     Uteriine  ca 72yr ago partial hysterectomy  . Collagen vascular disease ([place]     RA  3-4 yrs ago  . Ascites   . NASH (nonalcoholic steatohepatitis)   . Sleep apnea   . Migraines   . Respiratory infection      2/17    PAST SURGICAL HISTORY:   Past Surgical History  Procedure Laterality Date  . Abdominal hysterectomy    . Tubal ligation    . Colonoscopy with propofol N/A 06/23/2014    Procedure: COLONOSCOPY WITH PROPOFOL;  Surgeon: MLollie Sails MD;  [facility];  Service: Endoscopy;  Laterality: N/A;  . Esophagogastroduodenoscopy N/A 06/23/2014    Procedure: ESOPHAGOGASTRODUODENOSCOPY (EGD);  Surgeon: MLollie Sails MD;  [facility];  Service: Endoscopy;  Laterality: N/A;  . Cholecystectomy N/A 07/15/2014    Procedure: LAPAROSCOPIC CHOLECYSTECTOMY with liver biopsy ;  Surgeon: MSherri Rad MD;  Location: [facility] ORS;  Service: General;  Laterality: N/A;  . Ercp N/A 07/16/2014    Procedure: ENDOSCOPIC RETROGRADE CHOLANGIOPANCREATOGRAPHY (ERCP);  Surgeon: MClarene Essex MD;  [facility];  Service: Endoscopy;  Laterality: N/A;  . Wisdom tooth extraction    . Ercp N/A 10/03/2014    Procedure: ENDOSCOPIC RETROGRADE CHOLANGIOPANCREATOGRAPHY (ERCP);  Surgeon: PHulen Luster MD;  [facility];  Service: Gastroenterology;  Laterality: N/A;    SOCIAL HISTORY:   Social History  Substance Use Topics  . Smoking status: Never Smoker   . Smokeless tobacco: Never Used  . Alcohol Use: No     Comment: occ    FAMILY HISTORY:   Family History  Problem Relation Age of Onset  . Lung  cancer Father   . Ulcers Father   . Heart disease Sister   . Ulcers Sister   . Heart disease Brother     DRUG ALLERGIES:   Allergies  Allergen Reactions  . Tape Swelling  . Vicodin [Hydrocodone-Acetaminophen] Itching and Rash    REVIEW OF SYSTEMS:   Review of Systems  Constitutional: Positive for chills and malaise/fatigue. Negative for fever and weight loss.  HENT: Negative for ear discharge, ear pain, hearing loss, nosebleeds and tinnitus.   Eyes: Negative for blurred vision, double vision and photophobia.  Respiratory: Positive for shortness of breath. Negative  for cough, hemoptysis and wheezing.   Cardiovascular: Negative for chest pain, palpitations, orthopnea and leg swelling.  Gastrointestinal: Positive for nausea, abdominal pain and diarrhea. Negative for heartburn, vomiting and melena.  Genitourinary: Negative for dysuria, urgency and frequency.  Musculoskeletal: Positive for myalgias. Negative for back pain and neck pain.  Skin: Negative for rash.  Neurological: Positive for headaches. Negative for dizziness, tremors, sensory change, speech change and focal weakness.  Endo/Heme/Allergies: Does not bruise/bleed easily.  Psychiatric/Behavioral: Negative for depression.    MEDICATIONS AT HOME:   Prior to Admission medications   Medication Sig Start Date End Date Taking? Authorizing Provider  albuterol (PROVENTIL HFA;VENTOLIN HFA) 108 (90 BASE) MCG/ACT inhaler Inhale 2 puffs into the lungs every 4 (four) hours as needed for wheezing or shortness of breath.   Yes Historical Provider, MD  ALPRAZolam Duanne Moron) 1 MG tablet Take 1-2 mg by mouth 3 (three) times daily as needed for anxiety.   Yes Historical Provider, MD  aspirin EC 81 MG tablet Take 81 mg by mouth daily.    Yes Historical Provider, MD  citalopram (CELEXA) 20 MG tablet Take 20 mg by mouth at bedtime.    Yes Historical Provider, MD  dicyclomine (BENTYL) 10 MG capsule Take 10 mg by mouth 4 (four) times daily -  before meals and at bedtime.    Yes Historical Provider, MD  famotidine (PEPCID) 20 MG tablet Take 20 mg by mouth daily.    Yes Historical Provider, MD  fluticasone (FLONASE) 50 MCG/ACT nasal spray Place 1-2 sprays into both nostrils daily as needed for rhinitis.    Yes Historical Provider, MD  Folate-B12-Intrinsic Factor (INTRINSI B12-FOLATE) [phone] MCG-MCG-MG TABS Take 1 tablet by mouth daily.   Yes Historical Provider, MD  furosemide (LASIX) 40 MG tablet Take 40 mg by mouth daily.    Yes Historical Provider, MD  gabapentin (NEURONTIN) 300 MG capsule Take 600 mg by mouth 4  (four) times daily as needed (for pain).    Yes Historical Provider, MD  hydrochlorothiazide (HYDRODIURIL) 25 MG tablet Take 25 mg by mouth at bedtime.    Yes Historical Provider, MD  lactulose (CEPHULAC) 20 g packet Take 20 g by mouth 2 (two) times daily.    Yes Historical Provider, MD  loperamide (IMODIUM) 2 MG capsule Take 1 capsule (2 mg total) by mouth every 6 (six) hours as needed for diarrhea or loose stools. 10/06/14  Yes Theodoro Grist, MD  metFORMIN (GLUCOPHAGE) 1000 MG tablet Take 1,000 mg by mouth 2 (two) times daily as needed (for high blood sugar).    Yes Historical Provider, MD  nystatin (MYCOSTATIN) powder Apply 1 g topically 3 (three) times daily as needed (for irritation).    Yes Historical Provider, MD  omeprazole (PRILOSEC) 20 MG capsule Take 20 mg by mouth 2 (two) times daily before a meal.   Yes Historical Provider, MD  ondansetron (ZOFRAN) 4  MG tablet Take 1 tablet (4 mg total) by mouth every 6 (six) hours as needed for nausea. 10/06/14  Yes Theodoro Grist, MD  oxyCODONE-acetaminophen (PERCOCET) 10-325 MG tablet Take 1 tablet by mouth 5 (five) times daily as needed for pain. Do not take for 11 hours after taking Xanax. Max.: 5/day. 03/05/15  Yes Milinda Pointer, MD  promethazine (PHENERGAN) 12.5 MG tablet Take 12.5 mg by mouth every 6 (six) hours as needed for nausea or vomiting.    Yes Historical Provider, MD  rifaximin (XIFAXAN) 550 MG TABS tablet Take 550 mg by mouth 2 (two) times daily.    Yes Historical Provider, MD  rizatriptan (MAXALT) 10 MG tablet Take 10 mg by mouth as needed for migraine.    Yes Historical Provider, MD  spironolactone (ALDACTONE) 100 MG tablet Take 100 mg by mouth daily.    Yes Historical Provider, MD  vitamin B-12 (CYANOCOBALAMIN) 1000 MCG tablet Take 2,000 mcg by mouth daily.   Yes Historical Provider, MD      VITAL SIGNS:  Blood pressure 116/85, pulse 84, temperature 98.3 F (36.8 C), temperature source Oral, resp. rate 19, height 5' 2"  (1.575 m),  weight 96.163 kg (212 lb), SpO2 93 %.  PHYSICAL EXAMINATION:   Physical Exam  GENERAL:  50 y.o.-year-old patient lying in the bed with no acute distress.  EYES: Pupils equal, round, reactive to light and accommodation. No scleral icterus. Extraocular muscles intact.  HEENT: Head atraumatic, normocephalic. Oropharynx and nasopharynx clear.  NECK:  Supple, no jugular venous distention. No thyroid enlargement, no tenderness.  LUNGS: Normal breath sounds bilaterally, no wheezing, rales,rhonchi or crepitation. No use of accessory muscles of respiration.  CARDIOVASCULAR: S1, S2 normal. No murmurs, rubs, or gallops.  ABDOMEN: Soft, nontender, nondistended. Bowel sounds present. No organomegaly or mass.  EXTREMITIES: No pedal edema, cyanosis, or clubbing.  NEUROLOGIC: Cranial nerves II through XII are intact. Muscle strength 5/5 in all extremities. Sensation intact. Gait not checked.  PSYCHIATRIC: The patient is alert and oriented x 3. Sleepy- easily arousable. SKIN: No obvious rash, lesion, or ulcer.   LABORATORY PANEL:   CBC  Recent Labs Lab 03/11/15 1629  WBC 6.3  HGB 13.7  HCT 39.0  PLT 128*   ------------------------------------------------------------------------------------------------------------------  Chemistries   Recent Labs Lab 03/11/15 1629  NA 136  K 3.6  CL 99*  CO2 27  GLUCOSE 199*  BUN 14  CREATININE 0.58  CALCIUM 9.5  AST 61*  ALT 62*  ALKPHOS 78  BILITOT 0.7   ------------------------------------------------------------------------------------------------------------------  Cardiac Enzymes  Recent Labs Lab 03/11/15 1629  TROPONINI <0.03   ------------------------------------------------------------------------------------------------------------------  RADIOLOGY:  Ct Head Wo Contrast  03/11/2015  CLINICAL DATA:  Difficulty speaking today. No known injury. Initial encounter. EXAM: CT HEAD WITHOUT CONTRAST TECHNIQUE: Contiguous axial images were  obtained from the base of the skull through the vertex without intravenous contrast. COMPARISON:  Head CT scan 10/10/2011. FINDINGS: There is no evidence of acute intracranial abnormality including hemorrhage, infarct, mass lesion, mass effect, midline shift or abnormal extra-axial fluid collection. No hydrocephalus or pneumocephalus. The calvarium is intact. Imaged paranasal sinuses and mastoid air cells are clear. IMPRESSION: Negative head CT. Electronically Signed   By: Inge Rise M.D.   On: 03/11/2015 18:42   Ct Abdomen Pelvis W Contrast  03/11/2015  CLINICAL DATA:  Diffuse abdominal pain and tenderness. Nausea. Cirrhosis. EXAM: CT ABDOMEN AND PELVIS WITH CONTRAST TECHNIQUE: Multidetector CT imaging of the abdomen and pelvis was performed using the standard protocol following bolus  administration of intravenous contrast. CONTRAST:  144m OMNIPAQUE IOHEXOL 300 MG/ML  SOLN COMPARISON:  01/29/2015 FINDINGS: Lower chest:  No acute findings. Hepatobiliary: Hepatic cirrhosis again demonstrated. Several tiny left hepatic lobe cyst remains stable and no definite liver masses are identified. Prior cholecystectomy noted. No evidence of biliary dilatation. Pancreas: No mass, inflammatory changes, or other significant abnormality. Spleen: Mild splenomegaly again seen, consistent with portal venous hypertension. Adrenals/Urinary Tract: No masses identified. No evidence of hydronephrosis. Stomach/Bowel: No evidence of obstruction, inflammatory process, or abnormal fluid collections. Normal appendix visualized. Vascular/Lymphatic: No pathologically enlarged lymph nodes. No evidence of abdominal aortic aneurysm. Reproductive: No mass or other significant abnormality. Other: No evidence ascites. There has been further decrease in size and near complete resolution of hematoma in the inferior right rectus sheath near the pubic insertion. Musculoskeletal:  No suspicious bone lesions identified. IMPRESSION: No acute findings  within the abdomen or pelvis. Stable hepatic cirrhosis and small cysts. No radiographic evidence of hepatic neoplasm. Stable splenomegaly, consistent with portal venous hypertension. No evidence of ascites. Near complete resolution of inferior right rectus sheath hematoma adjacent to pubic insertion. Electronically Signed   By: JEarle GellM.D.   On: 03/11/2015 19:04   Dg Chest Portable 1 View  03/11/2015  CLINICAL DATA:  Generalized weakness and altered mental status today. EXAM: PORTABLE CHEST 1 VIEW COMPARISON:  Single view of the chest 10/04/2014. FINDINGS: There is cardiomegaly without edema. No pneumothorax or pleural effusion. No focal bony abnormality. IMPRESSION: Cardiomegaly without acute disease. Electronically Signed   By: TInge RiseM.D.   On: 03/11/2015 18:02    EKG:   Orders placed or performed during the hospital encounter of 03/11/15  . ED EKG  . ED EKG  . EKG 12-Lead  . EKG 12-Lead    IMPRESSION AND PLAN:   DBeanca Mckenzie is a 50y.o. female with a known history of chronic back pain and chronic abdominal pain, nonalcoholic steatohepatitis with history of hepatic encephalopathy, GERD, diabetes and hypertension presents from home secondary to increased confusion for the last 2 days.  #1 Hepatic encephalopathy- Ammonia elevated, Increase lactulose dose, on xifaxan - f/u in AM - CT head with no acute findings, sleepy- but alert and oriented - If ammonia improves and still sleepy- cut down her pain meds  #2 Chronic back pain and chronic abdominal pain- follows with pain mgmt, cont prn pain meds Monitor mental status- so haven't started on IV pain meds  #3 DM- on metformin, continue  #4 Liver cirrhosis- NASH, follows with GI as outpatient CT abd showing stable cirrhosis, improved right rectal sheath hematoma. No acute findings Cont aldactone and cirrhosis meds. Hold lasix today  #5 Migraine- Improving. Continue prn Maxalt   #6 DVT prophylaxis- on lovenox    All  the records are reviewed and case discussed with ED provider. Management plans discussed with the patient, family and they are in agreement.  CODE STATUS: Full Code  TOTAL TIME TAKING CARE OF THIS PATIENT: 50 minutes.    KGladstone LighterM.D on 03/11/2015 at 10:18 PM  Between 7am to 6pm - Pager - [phone]  After 6pm go to [email or web address] - password [email or web address] [facility]  Office  [phone] CC: Primary care physician; Ashkin, ENeldon Labella MD

## 2015-03-11 NOTE — ED Provider Notes (Signed)
[facility] Emergency Department Provider Note  ____________________________________________  Time seen: 4:20 PM  I have reviewed the triage vital signs and the nursing notes.   HISTORY  Chief Complaint Abdominal Pain and Back Pain    HPI Lori Mckenzie is a 50 y.o. female complaining of episodic altered mental status where she is mumbling and incoherent and confused. This seems to wax and wane throughout the day over the last 2 days. She also complains of severe abdominal pain and chronic back pain and a new headache in the top of her head. No neck pain or stiffness. No vomiting, no trauma. Took her lactulose today and yesterday because of these changes with some increased stool output. No focal weakness or paresthesia. No vision changes. She feels it may be related to taking all of her medications in the morning.     Past Medical History  Diagnosis Date  . Brittle bone disease   . Asthma   . Thyroid disease   . Hypertension   . Back pain   . Diabetes mellitus without complication ([place])   . Chronic kidney disease   . Anxiety   . GERD (gastroesophageal reflux disease)   . Sleep apnea   . COPD (chronic obstructive pulmonary disease) ([place])   . Hypothyroidism   . Cervical disc disease   . TIA (transient ischemic attack)   . Cancer ([place])     Uteriine  ca 55yr ago partial hysterectomy  . Collagen vascular disease ([place]     RA  3-4 yrs ago  . Ascites   . NASH (nonalcoholic steatohepatitis)   . NASH (nonalcoholic steatohepatitis)   . Sleep apnea   . Migraines   . Respiratory infection     2/17     Patient Active Problem List   Diagnosis Date Noted  . Elevated sedimentation rate 03/05/2015  . Elevated C-reactive protein (CRP) 03/05/2015  . Lumbar facet syndrome (Location of Primary Source of Pain) (Bilateral) (R>L) 03/05/2015  . Cervical spondylosis 03/05/2015  . Chronic feet pain (Location of Secondary source of pain) (Bilateral) (R>L) 03/05/2015   . Lumbar spondylosis 03/05/2015  . Encounter for chronic pain management 03/05/2015  . Chronic shoulder pain (Bilateral) (R>L) 03/05/2015  . Chronic carpal tunnel syndrome (Bilateral) 03/05/2015  . Chronic hip pain (Bilateral) (L>R) 03/05/2015  . Chronic upper back pain (Bilateral) (L>R) 03/05/2015  . Osteoporosis, idiopathic 03/05/2015  . Abnormal MRI, lumbar spine (02/03/2015) 03/05/2015  . Thoracic radiculitis 02/05/2015  . Vitamin D deficiency 01/13/2015  . B12 deficiency 01/13/2015  . Folate deficiency 01/13/2015  . Subacute lumbar radiculopathy (left side) (S1 dermatome) 12/10/2014  . Fall 11/16/2014  . Drowsiness 11/16/2014  . Episode of syncope 11/16/2014  . Chronic pain 10/28/2014  . Opiate use (75 MME/Day) 10/28/2014  . Long term prescription opiate use 10/28/2014  . Long term current use of opiate analgesic 10/28/2014  . Encounter for therapeutic drug level monitoring 10/28/2014  . Chronic epigastric abdominal pain 10/28/2014  . Chronic low back pain (Location of Primary Source of Pain) (Bilateral) (R>L) 10/28/2014  . Chronic neck pain (Location of Tertiary source of pain) (Bilateral) (R>L) 10/28/2014  . Sleep apnea 10/28/2014  . Pyrexia   . Chronic obstructive pulmonary disease ([place] 09/10/2014  . Ascites 09/05/2014  . NASH (nonalcoholic steatohepatitis) 09/05/2014  . Hypokalemia 09/05/2014  . Abdominal abscess ([place] 08/25/2014  . Dysthymia 08/05/2014  . Other social stressor 08/05/2014  . Bile leak, postoperative 07/17/2014  . Steatohepatitis 07/15/2014  . Type 2 diabetes  mellitus ([place]) 07/12/2014  . COPD (chronic obstructive pulmonary disease) ([place]) 07/12/2014  . HTN (hypertension) 07/12/2014  . GERD (gastroesophageal reflux disease) 07/12/2014  . OSA on CPAP 07/12/2014  . Anxiety 07/12/2014  . 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 Lumbar and sacral osteoarthritis 03/26/2014  . Myofascial pain 03/26/2014  . Breath shortness  01/09/2014  . Diastolic dysfunction 16/10/9602  . Airway hyperreactivity 08/06/2013  . Essential (primary) hypertension 08/06/2013  . Clinical depression 12/05/2011  . Major depressive disorder with single episode ([place]) 12/05/2011  . Anxiety state 08/10/2010  . Acid reflux 08/10/2010     Past Surgical History  Procedure Laterality Date  . Abdominal hysterectomy    . Tubal ligation    . Colonoscopy with propofol N/A 06/23/2014    Procedure: COLONOSCOPY WITH PROPOFOL;  Surgeon: Lollie Sails, MD;  Location: [facility] ENDOSCOPY;  Service: Endoscopy;  Laterality: N/A;  . Esophagogastroduodenoscopy N/A 06/23/2014    Procedure: ESOPHAGOGASTRODUODENOSCOPY (EGD);  Surgeon: Lollie Sails, MD;  Location: [facility] ENDOSCOPY;  Service: Endoscopy;  Laterality: N/A;  . Cholecystectomy N/A 07/15/2014    Procedure: LAPAROSCOPIC CHOLECYSTECTOMY with liver biopsy ;  Surgeon: Sherri Rad, MD;  Location: [facility] ORS;  Service: General;  Laterality: N/A;  . Ercp N/A 07/16/2014    Procedure: ENDOSCOPIC RETROGRADE CHOLANGIOPANCREATOGRAPHY (ERCP);  Surgeon: Clarene Essex, MD;  Location: [facility] ENDOSCOPY;  Service: Endoscopy;  Laterality: N/A;  . Wisdom tooth extraction    . Ercp N/A 10/03/2014    Procedure: ENDOSCOPIC RETROGRADE CHOLANGIOPANCREATOGRAPHY (ERCP);  Surgeon: Hulen Luster, MD;  Location: [facility] ENDOSCOPY;  Service: Gastroenterology;  Laterality: N/A;     Current Outpatient Rx  Name  Route  Sig  Dispense  Refill  . albuterol (PROVENTIL HFA;VENTOLIN HFA) 108 (90 BASE) MCG/ACT inhaler   Inhalation   Inhale 2 puffs into the lungs every 4 (four) hours as needed for wheezing or shortness of breath.         . ALPRAZolam (XANAX) 1 MG tablet   Oral   Take 1 tablet (1 mg total) by mouth 3 (three) times daily as needed for anxiety. Patient taking differently: Take 2 mg by mouth 3 (three) times daily as needed.    30 tablet   0   . aspirin EC 81 MG tablet   Oral   Take 81 mg by mouth daily. Reported on 03/05/2015          . citalopram (CELEXA) 20 MG tablet   Oral   Take 20 mg by mouth at bedtime.          . cyanocobalamin ([employer] VITAMIN B12) 2000 MCG tablet   Oral   Take 1 tablet (2,000 mcg total) by mouth daily.   30 tablet   PRN     Do not place this medication, or any other prescri ...   . dicyclomine (BENTYL) 10 MG capsule   Oral   Take 10 mg by mouth 4 (four) times daily -  before meals and at bedtime.          . famotidine (PEPCID) 20 MG tablet   Oral   Take 20 mg by mouth.         . fluticasone (FLONASE) 50 MCG/ACT nasal spray   Each Nare   Place 1 spray into both nostrils daily as needed for rhinitis.          Derald Macleod Factor (INTRINSI B12-FOLATE) 540-981-19 MCG-MCG-MG TABS   Oral   Take 500 mcg by mouth  daily.   30 each   PRN   . furosemide (LASIX) 40 MG tablet   Oral   Take 40 mg by mouth 2 (two) times daily.          Marland Kitchen gabapentin (NEURONTIN) 300 MG capsule   Oral   Take 600 mg by mouth 3 (three) times daily.          . hydrochlorothiazide (HYDRODIURIL) 25 MG tablet   Oral   Take 25 mg by mouth daily. On scale based on b/p         . lactulose (CEPHULAC) 20 g packet   Oral   Take 20 g by mouth 2 (two) times daily.          Marland Kitchen loperamide (IMODIUM) 2 MG capsule   Oral   Take 1 capsule (2 mg total) by mouth every 6 (six) hours as needed for diarrhea or loose stools.   30 capsule   0   . metFORMIN (GLUCOPHAGE) 1000 MG tablet   Oral   Take 1,000 mg by mouth 2 (two) times daily with a meal. On sliding scale         . nystatin (MYCOSTATIN) powder   Topical   Apply topically.         Marland Kitchen omeprazole (PRILOSEC) 20 MG capsule   Oral   Take 20 mg by mouth 2 (two) times daily before a meal.         . ondansetron (ZOFRAN) 4 MG tablet   Oral   Take 1 tablet (4 mg total) by mouth every 6 (six) hours as needed for nausea.   20 tablet   0   . oxyCODONE-acetaminophen (PERCOCET) 10-325 MG tablet   Oral   Take 1 tablet by mouth 5 (five)  times daily as needed for pain. Do not take for 11 hours after taking Xanax. Max.: 5/day.   150 tablet   0     Do not place this medication, or any other prescri ...   . promethazine (PHENERGAN) 12.5 MG tablet   Oral   Take 12.5 mg by mouth every 6 (six) hours.         . rifaximin (XIFAXAN) 550 MG TABS tablet   Oral   Take 550 mg by mouth 2 (two) times daily. Reported on 02/05/2015         . rizatriptan (MAXALT) 10 MG tablet   Oral   Take 10 mg by mouth as needed for migraine.          Marland Kitchen spironolactone (ALDACTONE) 100 MG tablet   Oral   Take 1 tablet by mouth daily.            Allergies Tape and Vicodin   Family History  Problem Relation Age of Onset  . Lung cancer Father   . Ulcers Father   . Heart disease Sister   . Ulcers Sister   . Heart disease Brother     Social History Social History  Substance Use Topics  . Smoking status: Never Smoker   . Smokeless tobacco: Never Used  . Alcohol Use: No     Comment: occ    Review of Systems  Constitutional:   No fever or chills. No weight changes Eyes:   No blurry vision or double vision.  ENT:   No sore throat.  Cardiovascular:   No chest pain. Respiratory:   No dyspnea or cough. Gastrointestinal:   Nausea and abdominal pain, loose bowel movements. No vomiting.Marland Kitchen  No  BRBPR or melena. Genitourinary:   Negative for dysuria or difficulty urinating. Musculoskeletal:   Negative for back pain. No joint swelling or pain. Skin:   Negative for rash. Neurological:   Generalized headache. No focal weakness or paresthesia.Marland Kitchen Psychiatric:  No anxiety or depression.   Endocrine:  No changes in energy or sleep difficulty.  10-point ROS otherwise negative.  ____________________________________________   PHYSICAL EXAM:  VITAL SIGNS: ED Triage Vitals  Enc Vitals Group     BP 03/11/15 1628 113/74 mmHg     Pulse Rate 03/11/15 1628 97     Resp 03/11/15 1628 15     Temp 03/11/15 1628 98.3 F (36.8 C)     Temp  Source 03/11/15 1628 Oral     SpO2 03/11/15 1628 95 %     Weight 03/11/15 1628 212 lb (96.163 kg)     Height 03/11/15 1628 5' 2"  (1.575 m)     Head Cir --      Peak Flow --      Pain Score 03/11/15 1629 8     Pain Loc --      Pain Edu? --      Excl. in [place]? --     Vital signs reviewed, nursing assessments reviewed.   Constitutional:   Alert and oriented. Well appearing and in no distress. Eyes:   No scleral icterus. No conjunctival pallor. PERRL. EOMI ENT   Head:   Normocephalic and atraumatic.   Nose:   No congestion/rhinnorhea. No septal hematoma   Mouth/Throat:   Dry mucous membranes, no pharyngeal erythema. No peritonsillar mass.    Neck:   No stridor. No SubQ emphysema. No meningismus. Hematological/Lymphatic/Immunilogical:   No cervical lymphadenopathy. Cardiovascular:   RRR. Symmetric bilateral radial and DP pulses.  No murmurs.  Respiratory:   Normal respiratory effort without tachypnea nor retractions. Breath sounds are clear and equal bilaterally. No wheezes/rales/rhonchi. Gastrointestinal:   Soft with diffuse upper abdominal tenderness. Non distended. There is no CVA tenderness.  No rebound, rigidity, or guarding. Genitourinary:   deferred Musculoskeletal:   Nontender with normal range of motion in all extremities. No joint effusions.  No lower extremity tenderness.  No edema. Neurologic:   Slow speech, slightly slurred.  CN 2-10 normal. Motor grossly intact. No gross focal neurologic deficits are appreciated.  Skin:    Skin is warm, dry and intact. No rash noted.  No petechiae, purpura, or bullae. Psychiatric:   Mood and affect are normal. ____________________________________________    LABS (pertinent positives/negatives) (all labs ordered are listed, but only abnormal results are displayed) Labs Reviewed  COMPREHENSIVE METABOLIC PANEL - Abnormal; Notable for the following:    Chloride 99 (*)    Glucose, Bld 199 (*)    AST 61 (*)    ALT 62 (*)     All other components within normal limits  CBC WITH DIFFERENTIAL/PLATELET - Abnormal; Notable for the following:    MCH 34.2 (*)    Platelets 128 (*)    All other components within normal limits  AMMONIA - Abnormal; Notable for the following:    Ammonia 59 (*)    All other components within normal limits  URINALYSIS COMPLETEWITH MICROSCOPIC ([facility] ONLY) - Abnormal; Notable for the following:    Color, Urine YELLOW (*)    APPearance CLEAR (*)    Squamous Epithelial / LPF 0-5 (*)    All other components within normal limits  URINE CULTURE  LIPASE, BLOOD  TROPONIN I   ____________________________________________   EKG  Interpreted by me Normal sinus rhythm rate of 92, normal axis and intervals. Poor R-wave progression in anterior precordial leads. Normal ST segments and T waves.  ____________________________________________    RADIOLOGY  Chest x-ray unremarkable except for cardiomegaly CT head unremarkable CT abdomen and pelvis unremarkable  ____________________________________________   PROCEDURES   ____________________________________________   INITIAL IMPRESSION / ASSESSMENT AND PLAN / ED COURSE  Pertinent labs & imaging results that were available during my care of the patient were reviewed by me and considered in my medical decision making (see chart for details).  Patient presents with some episodic altered mental status. Concerned that her hepatic encephalopathy is worse. She uses her lactulose only intermittently when her symptoms seem to be worse. Also with nonspecific abdominal pain. Low suspicion for spontaneous bacterial peritonitis. No evidence of sepsis at this time. Initially started with lab workup but after this was essentially unrevealing except for a mildly elevated ammonia level, proceed with imaging of the head chest and abdomen. Imaging also was nonspecific. Patient reports no improvement in her symptoms. Still complains of headache severe back pain  and abdominal pain. At this time she is given multiple doses of IV analgesics, antiemetics, and IV fluids. Case discussed with the hospitalist to hospitalize her for intractable headache and abdominal pain. We'll continue lactulose to continue improving her ammonia level in case this is contributory.     ____________________________________________   FINAL CLINICAL IMPRESSION(S) / ED DIAGNOSES  Final diagnoses:  Intractable abdominal pain  Increased ammonia level  Intractable headache      Carrie Mew, MD 03/11/15 2018

## 2015-03-11 NOTE — ED Notes (Signed)
Pt woke up this morning with difficulty speaking; states, "I was mumblings."  Pt reports "that comes from the ammonia"  Pt called PCP and was advised to come to ED.  Pt reports feeling nauseous at this time, abdominal pain, back pain.  Pt A/Ox4, vitals WDL, no immediate distress at this time.  MD at bedside.

## 2015-03-12 LAB — GLUCOSE, CAPILLARY
Glucose-Capillary: 189 mg/dL — ABNORMAL HIGH (ref 65–99)
Glucose-Capillary: 179 mg/dL — ABNORMAL HIGH (ref 65–99)
Glucose-Capillary: 149 mg/dL — ABNORMAL HIGH (ref 65–99)
Glucose-Capillary: 192 mg/dL — ABNORMAL HIGH (ref 65–99)

## 2015-03-12 LAB — AMMONIA: Ammonia: 57 umol/L — ABNORMAL HIGH (ref 9–35)

## 2015-03-12 MED ORDER — OXYCODONE-ACETAMINOPHEN 5-325 MG PO TABS
1.0000 | ORAL_TABLET | Freq: Four times a day (QID) | ORAL | Status: DC | PRN
  Administered 2015-03-12 – 2015-03-13 (×3): 1 via ORAL
  Filled 2015-03-12 (×3): qty 1

## 2015-03-12 MED ORDER — METFORMIN HCL 500 MG PO TABS: 1000.0000 mg | ORAL_TABLET | Freq: Two times a day (BID) | ORAL | Status: DC | PRN

## 2015-03-12 MED ORDER — PANTOPRAZOLE SODIUM 40 MG PO TBEC
40.0000 mg | DELAYED_RELEASE_TABLET | Freq: Two times a day (BID) | ORAL | Status: DC
  Administered 2015-03-12 – 2015-03-13 (×4): 40 mg via ORAL
  Filled 2015-03-12 (×4): qty 1

## 2015-03-12 MED ORDER — ONDANSETRON HCL 4 MG/2ML IJ SOLN
4.0000 mg | Freq: Four times a day (QID) | INTRAMUSCULAR | Status: DC | PRN
  Administered 2015-03-13: 4 mg via INTRAVENOUS
  Filled 2015-03-12: qty 2

## 2015-03-12 MED ORDER — FAMOTIDINE 20 MG PO TABS
20.0000 mg | ORAL_TABLET | Freq: Every day | ORAL | Status: DC
  Administered 2015-03-12 – 2015-03-13 (×2): 20 mg via ORAL
  Filled 2015-03-12 (×2): qty 1

## 2015-03-12 MED ORDER — ONDANSETRON HCL 4 MG PO TABS: 4.0000 mg | ORAL_TABLET | Freq: Four times a day (QID) | ORAL | Status: DC | PRN

## 2015-03-12 MED ORDER — FUROSEMIDE 40 MG PO TABS
40.0000 mg | ORAL_TABLET | Freq: Every day | ORAL | Status: DC
  Administered 2015-03-12 – 2015-03-13 (×2): 40 mg via ORAL
  Filled 2015-03-12 (×2): qty 1

## 2015-03-12 MED ORDER — RIFAXIMIN 550 MG PO TABS
550.0000 mg | ORAL_TABLET | Freq: Two times a day (BID) | ORAL | Status: DC
  Administered 2015-03-12 – 2015-03-13 (×4): 550 mg via ORAL
  Filled 2015-03-12 (×7): qty 1

## 2015-03-12 MED ORDER — GABAPENTIN 300 MG PO CAPS
600.0000 mg | ORAL_CAPSULE | Freq: Four times a day (QID) | ORAL | Status: DC | PRN
  Administered 2015-03-12: 600 mg via ORAL
  Filled 2015-03-12: qty 2

## 2015-03-12 MED ORDER — ACETAMINOPHEN 325 MG PO TABS: 650.0000 mg | ORAL_TABLET | Freq: Four times a day (QID) | ORAL | Status: DC | PRN

## 2015-03-12 MED ORDER — FE FUMARATE-B12-VIT C-FA-IFC PO CAPS
1.0000 | ORAL_CAPSULE | Freq: Every day | ORAL | Status: DC
  Administered 2015-03-12 – 2015-03-13 (×2): 1 via ORAL
  Filled 2015-03-12 (×2): qty 1

## 2015-03-12 MED ORDER — FLUTICASONE PROPIONATE 50 MCG/ACT NA SUSP: 1.0000 | Freq: Every day | NASAL | Status: DC | PRN

## 2015-03-12 MED ORDER — DICYCLOMINE HCL 10 MG PO CAPS
10.0000 mg | ORAL_CAPSULE | Freq: Three times a day (TID) | ORAL | Status: DC
  Administered 2015-03-12 – 2015-03-13 (×5): 10 mg via ORAL
  Filled 2015-03-12 (×5): qty 1

## 2015-03-12 MED ORDER — OXYCODONE-ACETAMINOPHEN 10-325 MG PO TABS: 1.0000 | ORAL_TABLET | Freq: Four times a day (QID) | ORAL | Status: DC | PRN

## 2015-03-12 MED ORDER — SUMATRIPTAN SUCCINATE 100 MG PO TABS
100.0000 mg | ORAL_TABLET | ORAL | Status: DC | PRN
  Administered 2015-03-12: 100 mg via ORAL
  Filled 2015-03-12 (×3): qty 1

## 2015-03-12 MED ORDER — LACTULOSE 10 GM/15ML PO SOLN
20.0000 g | Freq: Three times a day (TID) | ORAL | Status: DC
  Administered 2015-03-12 – 2015-03-13 (×4): 20 g via ORAL
  Filled 2015-03-12 (×4): qty 30

## 2015-03-12 MED ORDER — GLIPIZIDE 5 MG PO TABS
5.0000 mg | ORAL_TABLET | Freq: Every day | ORAL | Status: DC
  Administered 2015-03-13: 5 mg via ORAL
  Filled 2015-03-12: qty 1

## 2015-03-12 MED ORDER — OXYCODONE HCL 5 MG PO TABS
5.0000 mg | ORAL_TABLET | Freq: Four times a day (QID) | ORAL | Status: DC | PRN
  Administered 2015-03-12: 5 mg via ORAL
  Filled 2015-03-12: qty 1

## 2015-03-12 MED ORDER — VITAMIN B-12 1000 MCG PO TABS
2000.0000 ug | ORAL_TABLET | Freq: Every day | ORAL | Status: DC
Start: 2015-03-12 — End: 2015-03-13
  Administered 2015-03-12 – 2015-03-13 (×2): 2000 ug via ORAL
  Filled 2015-03-12 (×2): qty 2

## 2015-03-12 MED ORDER — SPIRONOLACTONE 100 MG PO TABS
100.0000 mg | ORAL_TABLET | Freq: Every day | ORAL | Status: DC
  Administered 2015-03-13: 100 mg via ORAL
  Filled 2015-03-12: qty 1

## 2015-03-12 MED ORDER — INTRINSI B12-FOLATE 800-500-20 MCG-MCG-MG PO TABS: 1.0000 | ORAL_TABLET | Freq: Every day | ORAL | Status: DC

## 2015-03-12 MED ORDER — ACETAMINOPHEN 650 MG RE SUPP: 650.0000 mg | Freq: Four times a day (QID) | RECTAL | Status: DC | PRN

## 2015-03-12 MED ORDER — ENOXAPARIN SODIUM 40 MG/0.4ML ~~LOC~~ SOLN
40.0000 mg | SUBCUTANEOUS | Status: DC
  Administered 2015-03-12: 40 mg via SUBCUTANEOUS
  Filled 2015-03-12: qty 0.4

## 2015-03-12 MED ORDER — ASPIRIN EC 81 MG PO TBEC
81.0000 mg | DELAYED_RELEASE_TABLET | Freq: Every day | ORAL | Status: DC
  Administered 2015-03-12 – 2015-03-13 (×2): 81 mg via ORAL
  Filled 2015-03-12 (×2): qty 1

## 2015-03-12 MED ORDER — HYDROCHLOROTHIAZIDE 25 MG PO TABS
25.0000 mg | ORAL_TABLET | Freq: Every day | ORAL | Status: DC
  Filled 2015-03-12: qty 1

## 2015-03-12 MED ORDER — CITALOPRAM HYDROBROMIDE 20 MG PO TABS
20.0000 mg | ORAL_TABLET | Freq: Every day | ORAL | Status: DC
  Administered 2015-03-12: 20 mg via ORAL
  Filled 2015-03-12: qty 1

## 2015-03-12 MED ORDER — INSULIN ASPART 100 UNIT/ML ~~LOC~~ SOLN
0.0000 [IU] | Freq: Three times a day (TID) | SUBCUTANEOUS | Status: DC
  Administered 2015-03-12: 2 [IU] via SUBCUTANEOUS
  Administered 2015-03-12 – 2015-03-13 (×2): 1 [IU] via SUBCUTANEOUS
  Filled 2015-03-12: qty 1
  Filled 2015-03-12: qty 2
  Filled 2015-03-12: qty 1

## 2015-03-12 MED ORDER — ALPRAZOLAM 0.5 MG PO TABS
1.0000 mg | ORAL_TABLET | Freq: Three times a day (TID) | ORAL | Status: DC | PRN
  Administered 2015-03-12 (×2): 1 mg via ORAL
  Filled 2015-03-12 (×2): qty 2

## 2015-03-12 NOTE — Progress Notes (Signed)
Pt found up again with bed alarm off. Pt stated she turns it off when she gets up to go to bathroom. Re-educated pt again to please call for assistance when getting up.

## 2015-03-12 NOTE — Progress Notes (Signed)
Quick Note:  Lab results reviewed and found to be within normal limits. ______ 

## 2015-03-12 NOTE — Care Management (Signed)
History of [facility] last October 2016: Phone: [phone] Fax: [phone]. RNCM will follow up with [facility] when they open.

## 2015-03-12 NOTE — Evaluation (Signed)
Physical Therapy Evaluation Patient Details Name: Meloni Hinz MRN: [id number] DOB: [birth year] Today's Date: 03/12/2015   History of Present Illness  presented to ER secondary to AMS x2 days; admitted with hepatic encephalopathy.  PMH significant for steatohepatitis, DM, HTN, asthma, COPD, TIA and CA.  Clinical Impression  Upon evaluation, patient alert and oriented; follows all commands and demonstrates fair/good insight and safety awareness.  Bilat UE/LE strength and ROM grossly WFL and symmetrical; no focal weakness or paresthesia noted.  Rates pain (chronic) in back, per FACES scale 4/10; unchanged with activity. Patient mildly hypotensive, but responds appropriately to transition to upright and activity. Able to complete bed mobility indep; sit/stand, basic transfers and gait (150') without assist device, mod indep; stairs x4 with bilat rails, sup.  No significant balance losses, safety concerns noted.  Patient/husband confirm that physical performance at/near baseline for patient. No skilled PT needs identified at this time; did encourage continued mobility in room/around unit (with staff sup as necessary) during remaining hospitalization.  Patient/husband voiced understanding and agreement.  Will complete initial order at this time; please re-consult should needs change.     Follow Up Recommendations No PT follow up    Equipment Recommendations       Recommendations for Other Services       Precautions / Restrictions Precautions Precautions: Fall Restrictions Weight Bearing Restrictions: No      Mobility  Bed Mobility Overal bed mobility: Independent                Transfers Overall transfer level: Modified independent               General transfer comment: sit/stand from variety of seating surfaces, mod indep without assist device  Ambulation/Gait Ambulation/Gait assistance: Supervision;Modified independent (Device/Increase time) Ambulation Distance (Feet):  150 Feet Assistive device: None     Gait velocity interpretation: <1.8 ft/sec, indicative of risk for recurrent falls General Gait Details: broad BOS with mild lateral sway (due to body habitus vs. pathology), decreased cadence and gait speed; however, steady without balance/safety concerns.  Stairs Stairs: Yes Stairs assistance: Modified independent (Device/Increase time) Stair Management: Two rails Number of Stairs: 4 General stair comments: step to gait pattern leading with L LE; no buckling, LOB or safety concerns  Wheelchair Mobility    Modified Rankin (Stroke Patients Only)       Balance Overall balance assessment: Modified Independent         Standing balance support: No upper extremity supported Standing balance-Leahy Scale: Good                               Pertinent Vitals/Pain Pain Assessment: Faces Faces Pain Scale: Hurts little more Pain Location: back Pain Descriptors / Indicators: Aching Pain Intervention(s): Limited activity within patient's tolerance;Monitored during session;Repositioned;Patient requesting pain meds-RN notified    Home Living Family/patient expects to be discharged to:: Private residence Living Arrangements: Spouse/significant other Available Help at Discharge: Family;Available 24 hours/day   Home Access: Stairs to enter Entrance Stairs-Rails: None Entrance Stairs-Number of Steps: 1 Home Layout: Two level;1/2 bath on main level;Bed/bath upstairs (prefers bed/bath on upper level of home; able to live on main level if needed) Home Equipment: Walker - 2 wheels;Cane - single point      Prior Function Level of Independence: Independent         Comments: Mod indep/indep with ADLs, household and limited community mobility.  Does endorse single fall in previous six  months.     Hand Dominance        Extremity/Trunk Assessment   Upper Extremity Assessment: Overall WFL for tasks assessed           Lower  Extremity Assessment: Overall WFL for tasks assessed (grossly at least 4/5)         Communication   Communication: No difficulties  Cognition Arousal/Alertness: Awake/alert Behavior During Therapy: WFL for tasks assessed/performed Overall Cognitive Status: Within Functional Limits for tasks assessed                      General Comments      Exercises        Assessment/Plan    PT Assessment Patent does not need any further PT services  PT Diagnosis     PT Problem List    PT Treatment Interventions     PT Goals (Current goals can be found in the Care Plan section) Acute Rehab PT Goals Patient Stated Goal: to return home PT Goal Formulation: All assessment and education complete, DC therapy    Frequency     Barriers to discharge        Co-evaluation               End of Session Equipment Utilized During Treatment: Gait belt Activity Tolerance: Patient tolerated treatment well Patient left: in bed;with bed alarm set;with call bell/phone within reach;with family/visitor present Nurse Communication: Mobility status         Time: 1020-1044 PT Time Calculation (min) (ACUTE ONLY): 24 min   Charges:   PT Evaluation $PT Eval Low Complexity: 1 Procedure     PT G Codes:        Rayleen Wyrick H. Owens Shark, PT, DPT, NCS 03/12/2015, 2:11 PM [phone]

## 2015-03-12 NOTE — Progress Notes (Signed)
[place] [facility] [place] NAME: Lori Mckenzie    MR#:  [id number]  DATE OF BIRTH:  [birth year]  SUBJECTIVE:   Husband is at bedside and reports that despite patient being drowsy she needs her narcotics.  REVIEW OF SYSTEMS:    Review of Systems  Unable to perform ROS   Tolerating Diet:yes      DRUG ALLERGIES:   Allergies  Allergen Reactions  . Tape Swelling  . Vicodin [Hydrocodone-Acetaminophen] Itching and Rash    VITALS:  Blood pressure 100/55, pulse 79, temperature 97.7 F (36.5 C), temperature source Oral, resp. rate 18, height 5' 2"  (1.575 m), weight 96.163 kg (212 lb), SpO2 97 %.  PHYSICAL EXAMINATION:   Physical Exam  Constitutional: She is well-developed, well-nourished, and in no distress. No distress.  HENT:  Head: Normocephalic.  Eyes: No scleral icterus.  Neck: No JVD present. No tracheal deviation present.  Cardiovascular: Normal rate, regular rhythm and normal heart sounds.  Exam reveals no gallop and no friction rub.   No murmur heard. Pulmonary/Chest: Effort normal and breath sounds normal. No respiratory distress. She has no wheezes. She has no rales. She exhibits no tenderness.  Abdominal: Soft. Bowel sounds are normal. She exhibits no distension and no mass. There is no tenderness. There is no rebound and no guarding.  Musculoskeletal: Normal range of motion. She exhibits no edema.  Neurological:  Opens her eyes but seems drowsy  Skin: Skin is warm. No rash noted. No erythema.  Psychiatric: Judgment normal.      LABORATORY PANEL:   CBC  Recent Labs Lab 03/11/15 1629  WBC 6.3  HGB 13.7  HCT 39.0  PLT 128*   ------------------------------------------------------------------------------------------------------------------  Chemistries   Recent Labs Lab 03/11/15 1629  NA 136  K 3.6  CL 99*  CO2 27  GLUCOSE 199*  BUN 14  CREATININE 0.58  CALCIUM 9.5  AST 61*  ALT 62*  ALKPHOS 78   BILITOT 0.7   ------------------------------------------------------------------------------------------------------------------  Cardiac Enzymes  Recent Labs Lab 03/11/15 1629  TROPONINI <0.03   ------------------------------------------------------------------------------------------------------------------  RADIOLOGY:  Ct Head Wo Contrast  03/11/2015  CLINICAL DATA:  Difficulty speaking today. No known injury. Initial encounter. EXAM: CT HEAD WITHOUT CONTRAST TECHNIQUE: Contiguous axial images were obtained from the base of the skull through the vertex without intravenous contrast. COMPARISON:  Head CT scan 10/10/2011. FINDINGS: There is no evidence of acute intracranial abnormality including hemorrhage, infarct, mass lesion, mass effect, midline shift or abnormal extra-axial fluid collection. No hydrocephalus or pneumocephalus. The calvarium is intact. Imaged paranasal sinuses and mastoid air cells are clear. IMPRESSION: Negative head CT. Electronically Signed   By: Inge Rise M.D.   On: 03/11/2015 18:42   Ct Abdomen Pelvis W Contrast  03/11/2015  CLINICAL DATA:  Diffuse abdominal pain and tenderness. Nausea. Cirrhosis. EXAM: CT ABDOMEN AND PELVIS WITH CONTRAST TECHNIQUE: Multidetector CT imaging of the abdomen and pelvis was performed using the standard protocol following bolus administration of intravenous contrast. CONTRAST:  189m OMNIPAQUE IOHEXOL 300 MG/ML  SOLN COMPARISON:  01/29/2015 FINDINGS: Lower chest:  No acute findings. Hepatobiliary: Hepatic cirrhosis again demonstrated. Several tiny left hepatic lobe cyst remains stable and no definite liver masses are identified. Prior cholecystectomy noted. No evidence of biliary dilatation. Pancreas: No mass, inflammatory changes, or other significant abnormality. Spleen: Mild splenomegaly again seen, consistent with portal venous hypertension. Adrenals/Urinary Tract: No masses identified. No evidence of hydronephrosis. Stomach/Bowel:  No evidence of obstruction, inflammatory process, or  abnormal fluid collections. Normal appendix visualized. Vascular/Lymphatic: No pathologically enlarged lymph nodes. No evidence of abdominal aortic aneurysm. Reproductive: No mass or other significant abnormality. Other: No evidence ascites. There has been further decrease in size and near complete resolution of hematoma in the inferior right rectus sheath near the pubic insertion. Musculoskeletal:  No suspicious bone lesions identified. IMPRESSION: No acute findings within the abdomen or pelvis. Stable hepatic cirrhosis and small cysts. No radiographic evidence of hepatic neoplasm. Stable splenomegaly, consistent with portal venous hypertension. No evidence of ascites. Near complete resolution of inferior right rectus sheath hematoma adjacent to pubic insertion. Electronically Signed   By: Earle Gell M.D.   On: 03/11/2015 19:04   Dg Chest Portable 1 View  03/11/2015  CLINICAL DATA:  Generalized weakness and altered mental status today. EXAM: PORTABLE CHEST 1 VIEW COMPARISON:  Single view of the chest 10/04/2014. FINDINGS: There is cardiomegaly without edema. No pneumothorax or pleural effusion. No focal bony abnormality. IMPRESSION: Cardiomegaly without acute disease. Electronically Signed   By: Inge Rise M.D.   On: 03/11/2015 18:02     ASSESSMENT AND PLAN:   50 year old female with a history of NASH and chronic back pain presents with increased confusion.  1. Hepatic encephalopathy: Continue increased dose of lactulose and Xifaxan. CT the head showed no acute findings Try Narcan if no improvement in mental status Hold all narcotic pain medications  2. Chronic back pain and chronic abdominal pain: Patient follows up with pain management. Due to problem #1 would hold any sedative medications.  3. Diabetes without complication: Continue sliding scale insulin, ADA diet. Patient is on metformin This probably should be changed to another  medication given the patient's history of liver cirrhosis.  4.NASH: Patient follows with GI. CT of the abdomen on admission shows no acute abnormalities. Continue Aldactone, lactulose, Xifaxan and Lasix     Management plans discussed with the patient'[s husband and he is in agreement.  CODE STATUS: FULL  TOTAL TIME TAKING CARE OF THIS PATIENT: 31 minutes.     POSSIBLE D/C 1-2 days, DEPENDING ON CLINICAL CONDITION.   Hamsini Verrilli M.D on 03/12/2015 at 11:15 AM  Between 7am to 6pm - Pager - [phone] After 6pm go to [email or web address] - password [email or web address] [facility]  Office  [phone]  CC: Primary care physician; Ashkin, Neldon Labella, MD  Note: This dictation was prepared with Dragon dictation along with smaller phrase technology. Any transcriptional errors that result from this process are unintentional.

## 2015-03-12 NOTE — Care Management (Signed)
Patient has history with [facility] Phone: [phone] Fax: [phone]. Discharged on 10/08/14.

## 2015-03-12 NOTE — Progress Notes (Signed)
Spoke with patient - when NT went to do rounds noticed that patient bed alarm was not on. NT educated patient that bed alarm needed to be on. Patient stated she was the one that took the bed alarm on. She stated that family was in the room with her and we explained that  We still needed to have it on for her safety and responsible for her while she was under our care. Bed alarm applied back on, patient verbalized understanding.

## 2015-03-13 LAB — GLUCOSE, CAPILLARY
Glucose-Capillary: 142 mg/dL — ABNORMAL HIGH (ref 65–99)
Glucose-Capillary: 151 mg/dL — ABNORMAL HIGH (ref 65–99)

## 2015-03-13 LAB — URINE CULTURE: Culture: NO GROWTH

## 2015-03-13 LAB — TOXASSURE SELECT 13 (MW), URINE: PDF: 0

## 2015-03-13 LAB — AMMONIA: Ammonia: 68 umol/L — ABNORMAL HIGH (ref 9–35)

## 2015-03-13 MED ORDER — GLIPIZIDE 5 MG PO TABS: 5.0000 mg | ORAL_TABLET | Freq: Every day | ORAL | Status: DC

## 2015-03-13 MED ORDER — LACTULOSE 10 GM/15ML PO SOLN: 20.0000 g | Freq: Three times a day (TID) | ORAL | Status: DC

## 2015-03-13 NOTE — Care Management (Signed)
PT is not recommending HHPT. [facility] never returned my call. No further RNCM needs.

## 2015-03-13 NOTE — Progress Notes (Signed)
Pt with extreme drowsiness this AM, neuro assessment completed, pt sluggish, speech is slurred, pupil response sluggish. Reported changes to MD. Dr, Benjie Karvonen completed assessment, advised to not give pt pain meds as requested due to drowsiness. Plan to possibly d/c pt later this afternoon. Will continue to monitor.

## 2015-03-13 NOTE — Progress Notes (Signed)
Patient discharged to home with spouse. Medications reviewed, info given for medication assistance and free clinics. Pt will follow up with PCP in 1 week.

## 2015-03-13 NOTE — Discharge Summary (Signed)
[place] at [place] NAME: Lori Mckenzie    MR#:  [id number]  DATE OF BIRTH:  February 23, 1965  DATE OF ADMISSION:  03/11/2015 ADMITTING PHYSICIAN: Gladstone Lighter, MD  DATE OF DISCHARGE: 03/13/2015 PRIMARY CARE PHYSICIAN: Ashkin, Neldon Labella, MD    ADMISSION DIAGNOSIS:  Intractable abdominal pain [R10.9] Intractable headache [R51] Increased ammonia level [R79.89]  DISCHARGE DIAGNOSIS:  Active Problems:   Hepatic encephalopathy ([place])   SECONDARY DIAGNOSIS:   Past Medical History  Diagnosis Date  . Brittle bone disease   . Asthma   . Thyroid disease   . Hypertension   . Back pain   . Diabetes mellitus without complication ([place])   . Chronic kidney disease   . Anxiety   . GERD (gastroesophageal reflux disease)   . Sleep apnea   . COPD (chronic obstructive pulmonary disease) ([place])   . Hypothyroidism   . Cervical disc disease   . TIA (transient ischemic attack)   . Cancer ([place])     Uteriine  ca 9yr ago partial hysterectomy  . Collagen vascular disease ([place]     RA  3-4 yrs ago  . Ascites   . NASH (nonalcoholic steatohepatitis)   . Sleep apnea   . Migraines   . Respiratory infection     2/17    HOSPITAL COURSE:    50year old female with a history of NASH and chronic back pain presents with increased confusion.  1. Hepatic encephalopathy: Despite an elevated ammonia level patient is at her baseline. She has no confusion. She is ambulating down the hall. She will need to continue Xifaxan and increased dose of lactulose. CT the head showed no acute  2. Chronic back pain and chronic abdominal pain: Patient follows up with pain management. Due to problem #1 would hold any sedative medications.  3. Diabetes without complication: Continue , ADA diet. Patient is on metformin which was changed to glipizide due to liver cirrhosis  4.NASH: Patient follows with GI. CT of the abdomen on admission shows no acute abnormalities. Continue  Aldactone, lactulose, Xifaxan and Lasix   DISCHARGE CONDITIONS AND DIET:   Stable for discharge home with home health care on a low-sodium diet  CONSULTS OBTAINED:     DRUG ALLERGIES:   Allergies  Allergen Reactions  . Tape Swelling  . Vicodin [Hydrocodone-Acetaminophen] Itching and Rash    DISCHARGE MEDICATIONS:   Current Discharge Medication List    START taking these medications   Details  glipiZIDE (GLUCOTROL) 5 MG tablet Take 1 tablet (5 mg total) by mouth daily before breakfast. Qty: 30 tablet, Refills: 0    lactulose (CHRONULAC) 10 GM/15ML solution Take 30 mLs (20 g total) by mouth 3 (three) times daily. Qty: 240 mL, Refills: 0      CONTINUE these medications which have NOT CHANGED   Details  albuterol (PROVENTIL HFA;VENTOLIN HFA) 108 (90 BASE) MCG/ACT inhaler Inhale 2 puffs into the lungs every 4 (four) hours as needed for wheezing or shortness of breath.    ALPRAZolam (XANAX) 1 MG tablet Take 1-2 mg by mouth 3 (three) times daily as needed for anxiety.    aspirin EC 81 MG tablet Take 81 mg by mouth daily.     citalopram (CELEXA) 20 MG tablet Take 20 mg by mouth at bedtime.     dicyclomine (BENTYL) 10 MG capsule Take 10 mg by mouth 4 (four) times daily -  before meals and at bedtime.     famotidine (PEPCID)  20 MG tablet Take 20 mg by mouth daily.     fluticasone (FLONASE) 50 MCG/ACT nasal spray Place 1-2 sprays into both nostrils daily as needed for rhinitis.     Folate-B12-Intrinsic Factor (INTRINSI B12-FOLATE) 660-600-45 MCG-MCG-MG TABS Take 1 tablet by mouth daily.    furosemide (LASIX) 40 MG tablet Take 40 mg by mouth daily.     gabapentin (NEURONTIN) 300 MG capsule Take 600 mg by mouth 4 (four) times daily as needed (for pain).     hydrochlorothiazide (HYDRODIURIL) 25 MG tablet Take 25 mg by mouth at bedtime.     loperamide (IMODIUM) 2 MG capsule Take 1 capsule (2 mg total) by mouth every 6 (six) hours as needed for diarrhea or loose stools. Qty:  30 capsule, Refills: 0    nystatin (MYCOSTATIN) powder Apply 1 g topically 3 (three) times daily as needed (for irritation).     omeprazole (PRILOSEC) 20 MG capsule Take 20 mg by mouth 2 (two) times daily before a meal.    ondansetron (ZOFRAN) 4 MG tablet Take 1 tablet (4 mg total) by mouth every 6 (six) hours as needed for nausea. Qty: 20 tablet, Refills: 0    oxyCODONE-acetaminophen (PERCOCET) 10-325 MG tablet Take 1 tablet by mouth 5 (five) times daily as needed for pain. Do not take for 11 hours after taking Xanax. Max.: 5/day. Qty: 150 tablet, Refills: 0   Associated Diagnoses: Chronic pain    promethazine (PHENERGAN) 12.5 MG tablet Take 12.5 mg by mouth every 6 (six) hours as needed for nausea or vomiting.     rifaximin (XIFAXAN) 550 MG TABS tablet Take 550 mg by mouth 2 (two) times daily.     rizatriptan (MAXALT) 10 MG tablet Take 10 mg by mouth as needed for migraine.     spironolactone (ALDACTONE) 100 MG tablet Take 100 mg by mouth daily.     vitamin B-12 (CYANOCOBALAMIN) 1000 MCG tablet Take 2,000 mcg by mouth daily.      STOP taking these medications     lactulose (CEPHULAC) 20 g packet      metFORMIN (GLUCOPHAGE) 1000 MG tablet               Today   CHIEF COMPLAINT:  Patient is doing well is moaning. She patient ambulated around the hall with her husband. She is not confused.   VITAL SIGNS:  Blood pressure 102/73, pulse 74, temperature 97.5 F (36.4 C), temperature source Oral, resp. rate 18, height 5' 2"  (1.575 m), weight 96.163 kg (212 lb), SpO2 96 %.   REVIEW OF SYSTEMS:  Review of Systems  Constitutional: Negative for fever, chills and malaise/fatigue.  HENT: Negative for sore throat.   Eyes: Negative for blurred vision.  Respiratory: Negative for cough, hemoptysis, shortness of breath and wheezing.   Cardiovascular: Negative for chest pain, palpitations and leg swelling.  Gastrointestinal: Negative for nausea, vomiting, abdominal pain,  diarrhea and blood in stool.  Genitourinary: Negative for dysuria.  Musculoskeletal: Negative for back pain.  Neurological: Negative for dizziness, tremors and headaches.  Endo/Heme/Allergies: Does not bruise/bleed easily.     PHYSICAL EXAMINATION:  GENERAL:  50 y.o.-year-old patient lying in the bed with no acute distress.  NECK:  Supple, no jugular venous distention. No thyroid enlargement, no tenderness.  LUNGS: Normal breath sounds bilaterally, no wheezing, rales,rhonchi  No use of accessory muscles of respiration.  CARDIOVASCULAR: S1, S2 normal. No murmurs, rubs, or gallops.  ABDOMEN: Soft, non-tender, non-distended. Bowel sounds present. No organomegaly or mass.  EXTREMITIES: No pedal edema, cyanosis, or clubbing.  PSYCHIATRIC: The patient is alert and oriented x 3.  SKIN: No obvious rash, lesion, or ulcer.   DATA REVIEW:   CBC  Recent Labs Lab 03/11/15 1629  WBC 6.3  HGB 13.7  HCT 39.0  PLT 128*    Chemistries   Recent Labs Lab 03/11/15 1629  NA 136  K 3.6  CL 99*  CO2 27  GLUCOSE 199*  BUN 14  CREATININE 0.58  CALCIUM 9.5  AST 61*  ALT 62*  ALKPHOS 78  BILITOT 0.7    Cardiac Enzymes  Recent Labs Lab 03/11/15 1629  TROPONINI <0.03    Microbiology Results  @MICRORSLT48 @  RADIOLOGY:  Ct Head Wo Contrast  03/11/2015  CLINICAL DATA:  Difficulty speaking today. No known injury. Initial encounter. EXAM: CT HEAD WITHOUT CONTRAST TECHNIQUE: Contiguous axial images were obtained from the base of the skull through the vertex without intravenous contrast. COMPARISON:  Head CT scan 10/10/2011. FINDINGS: There is no evidence of acute intracranial abnormality including hemorrhage, infarct, mass lesion, mass effect, midline shift or abnormal extra-axial fluid collection. No hydrocephalus or pneumocephalus. The calvarium is intact. Imaged paranasal sinuses and mastoid air cells are clear. IMPRESSION: Negative head CT. Electronically Signed   By: Inge Rise  M.D.   On: 03/11/2015 18:42   Ct Abdomen Pelvis W Contrast  03/11/2015  CLINICAL DATA:  Diffuse abdominal pain and tenderness. Nausea. Cirrhosis. EXAM: CT ABDOMEN AND PELVIS WITH CONTRAST TECHNIQUE: Multidetector CT imaging of the abdomen and pelvis was performed using the standard protocol following bolus administration of intravenous contrast. CONTRAST:  118m OMNIPAQUE IOHEXOL 300 MG/ML  SOLN COMPARISON:  01/29/2015 FINDINGS: Lower chest:  No acute findings. Hepatobiliary: Hepatic cirrhosis again demonstrated. Several tiny left hepatic lobe cyst remains stable and no definite liver masses are identified. Prior cholecystectomy noted. No evidence of biliary dilatation. Pancreas: No mass, inflammatory changes, or other significant abnormality. Spleen: Mild splenomegaly again seen, consistent with portal venous hypertension. Adrenals/Urinary Tract: No masses identified. No evidence of hydronephrosis. Stomach/Bowel: No evidence of obstruction, inflammatory process, or abnormal fluid collections. Normal appendix visualized. Vascular/Lymphatic: No pathologically enlarged lymph nodes. No evidence of abdominal aortic aneurysm. Reproductive: No mass or other significant abnormality. Other: No evidence ascites. There has been further decrease in size and near complete resolution of hematoma in the inferior right rectus sheath near the pubic insertion. Musculoskeletal:  No suspicious bone lesions identified. IMPRESSION: No acute findings within the abdomen or pelvis. Stable hepatic cirrhosis and small cysts. No radiographic evidence of hepatic neoplasm. Stable splenomegaly, consistent with portal venous hypertension. No evidence of ascites. Near complete resolution of inferior right rectus sheath hematoma adjacent to pubic insertion. Electronically Signed   By: JEarle GellM.D.   On: 03/11/2015 19:04   Dg Chest Portable 1 View  03/11/2015  CLINICAL DATA:  Generalized weakness and altered mental status today. EXAM:  PORTABLE CHEST 1 VIEW COMPARISON:  Single view of the chest 10/04/2014. FINDINGS: There is cardiomegaly without edema. No pneumothorax or pleural effusion. No focal bony abnormality. IMPRESSION: Cardiomegaly without acute disease. Electronically Signed   By: TInge RiseM.D.   On: 03/11/2015 18:02      Management plans discussed with the patient and she is in agreement. Stable for discharge home  Patient should follow up with PCP in 1 week  CODE STATUS:     Code Status Orders        Start     Ordered   03/12/15  0009  Full code   Continuous     03/12/15 0009    Code Status History    Date Active Date Inactive Code Status Order ID Comments User Context   10/04/2014 12:59 PM 10/06/2014  8:20 PM Full Code [id number]  Baxter Hire, MD Inpatient   09/05/2014  2:29 AM 09/08/2014  6:10 PM Full Code [id number]  Lance Coon, MD Inpatient   08/25/2014  6:23 PM 08/30/2014  1:48 PM Full Code [id number]  Florene Glen, MD ED   08/03/2014  5:12 PM 08/08/2014  3:16 PM Full Code [id number]  Marlyce Huge, MD ED   07/24/2014  6:45 PM 07/30/2014  5:31 PM Full Code [id number]  Florene Glen, MD Inpatient   07/23/2014 11:42 AM 07/24/2014  3:27 AM Full Code [id number]  Sabino Dick, MD [place]   07/12/2014 11:25 PM 07/16/2014  4:30 PM Full Code [id number]  Lance Coon, MD Inpatient    Advance Directive Documentation        Most Recent Value   Type of Advance Directive  Healthcare Power of Attorney, Living will   Pre-existing out of facility DNR order (yellow form or pink MOST form)     "MOST" Form in Place?        TOTAL TIME TAKING CARE OF THIS PATIENT: 36 minutes.    Note: This dictation was prepared with Dragon dictation along with smaller phrase technology. Any transcriptional errors that result from this process are unintentional.  Adesuwa Osgood M.D on 03/13/2015 at 10:53 AM  Between 7am to 6pm - Pager - [phone] After 6pm go to [email or web address] - password [email or web address]  Office  [phone]  CC: Primary care physician; Ashkin, Neldon Labella, MD

## 2015-03-13 NOTE — Progress Notes (Signed)
Pt up with assistance of spouse, ambulated around nurses station without difficulty. Called nursing immediately after to request pain/nausea medication. MD aware.

## 2015-03-13 NOTE — Care Management (Addendum)
Patient is not eligible for assistance with Rifaximin. Contact number to Rifaximin delivered to patient for her to seek assistance.[phone].  1240PM: received callback from Levada Dy with [facility] home health stating that they could not provide services to this patient for home health (which is not currently needed).

## 2015-03-19 NOTE — Progress Notes (Signed)
Quick Note:  The results of this diagnostic imaging were reviewed and found to be abnormal. No acute injury or pathology identified. Consider interventional pain management techniques. The patient may benefit from thoracic epidural steroid injections, thoracic transforaminal epidural steroid injections, and/or thoracic facet blocks, depending on the distribution of the pain. ______

## 2015-03-31 ENCOUNTER — Encounter: Payer: Self-pay | Admitting: Pain Medicine

## 2015-03-31 IMAGING — CR DG ABDOMEN 1V
1 series · 2 of 2 positions shown · non-contrast
Comparison: CT chest 08/26/2013

CLINICAL DATA: Shortness of breath, chest pain, flank pain

EXAM:
CHEST  2 VIEW

[Series 1: t abdomen supine · 0.14mm/px · 2 of 2 slices shown]
[im 1/2]
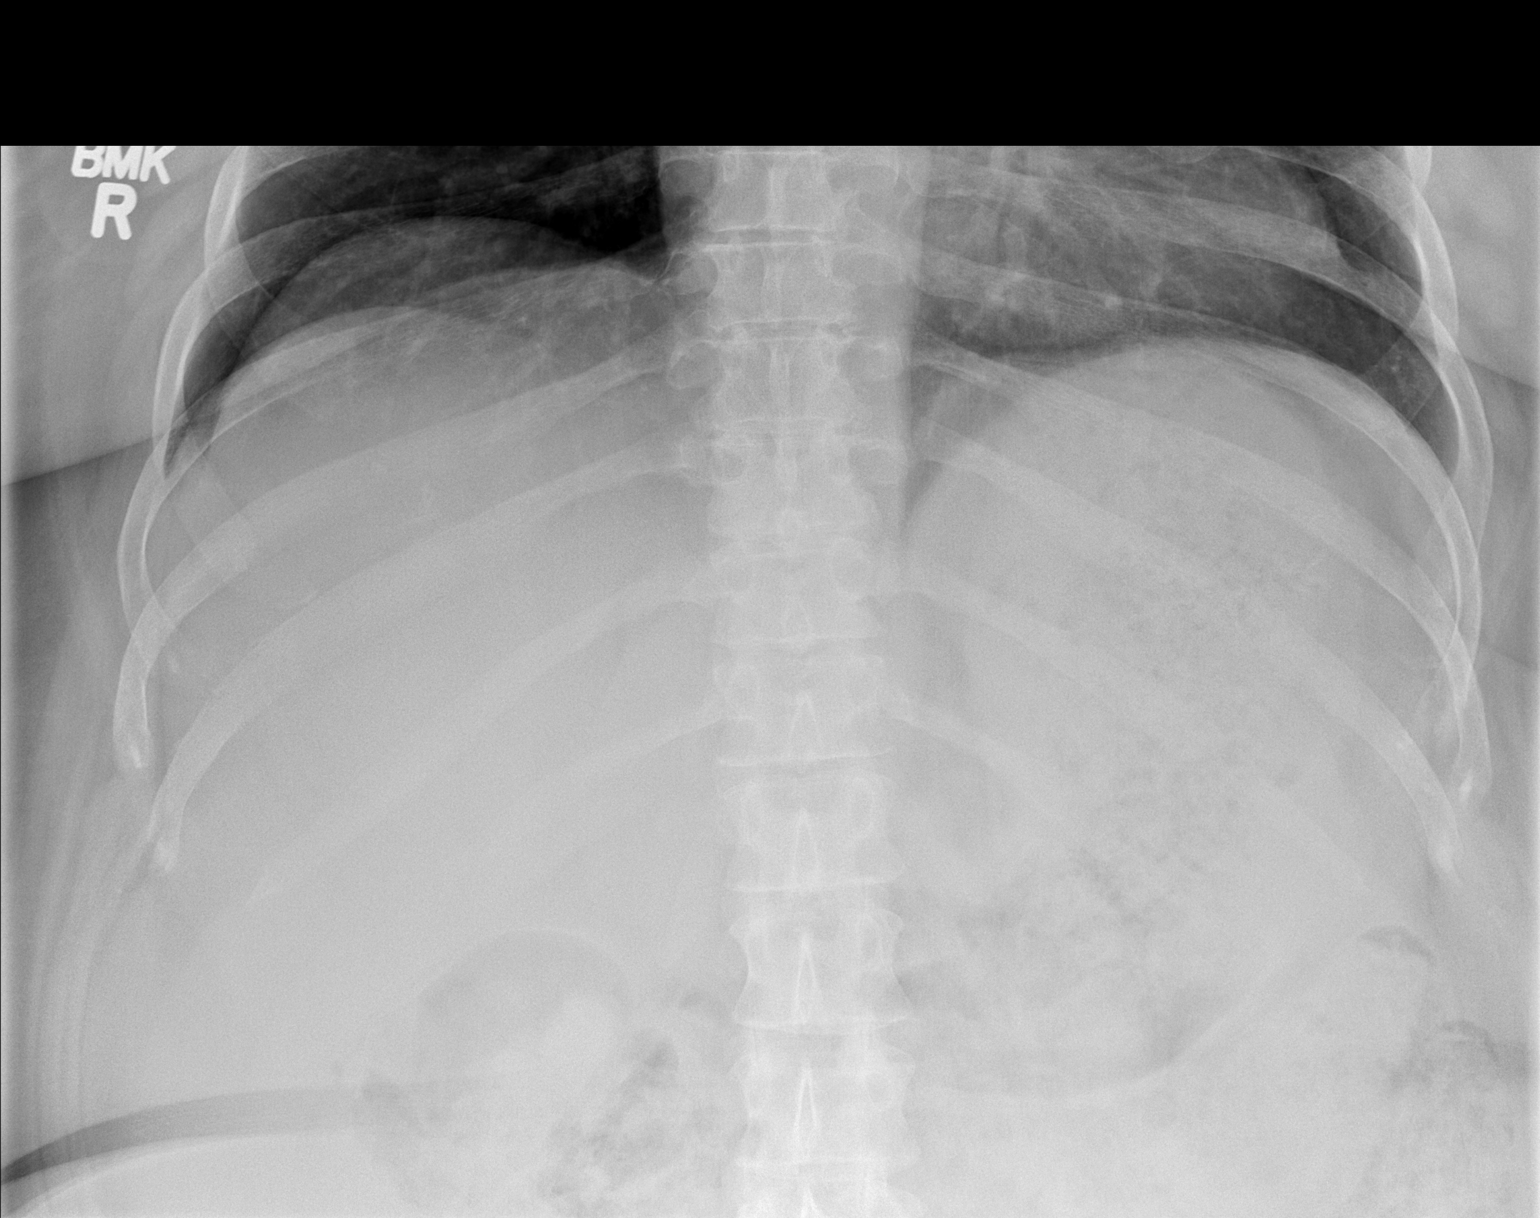
[im 2/2]
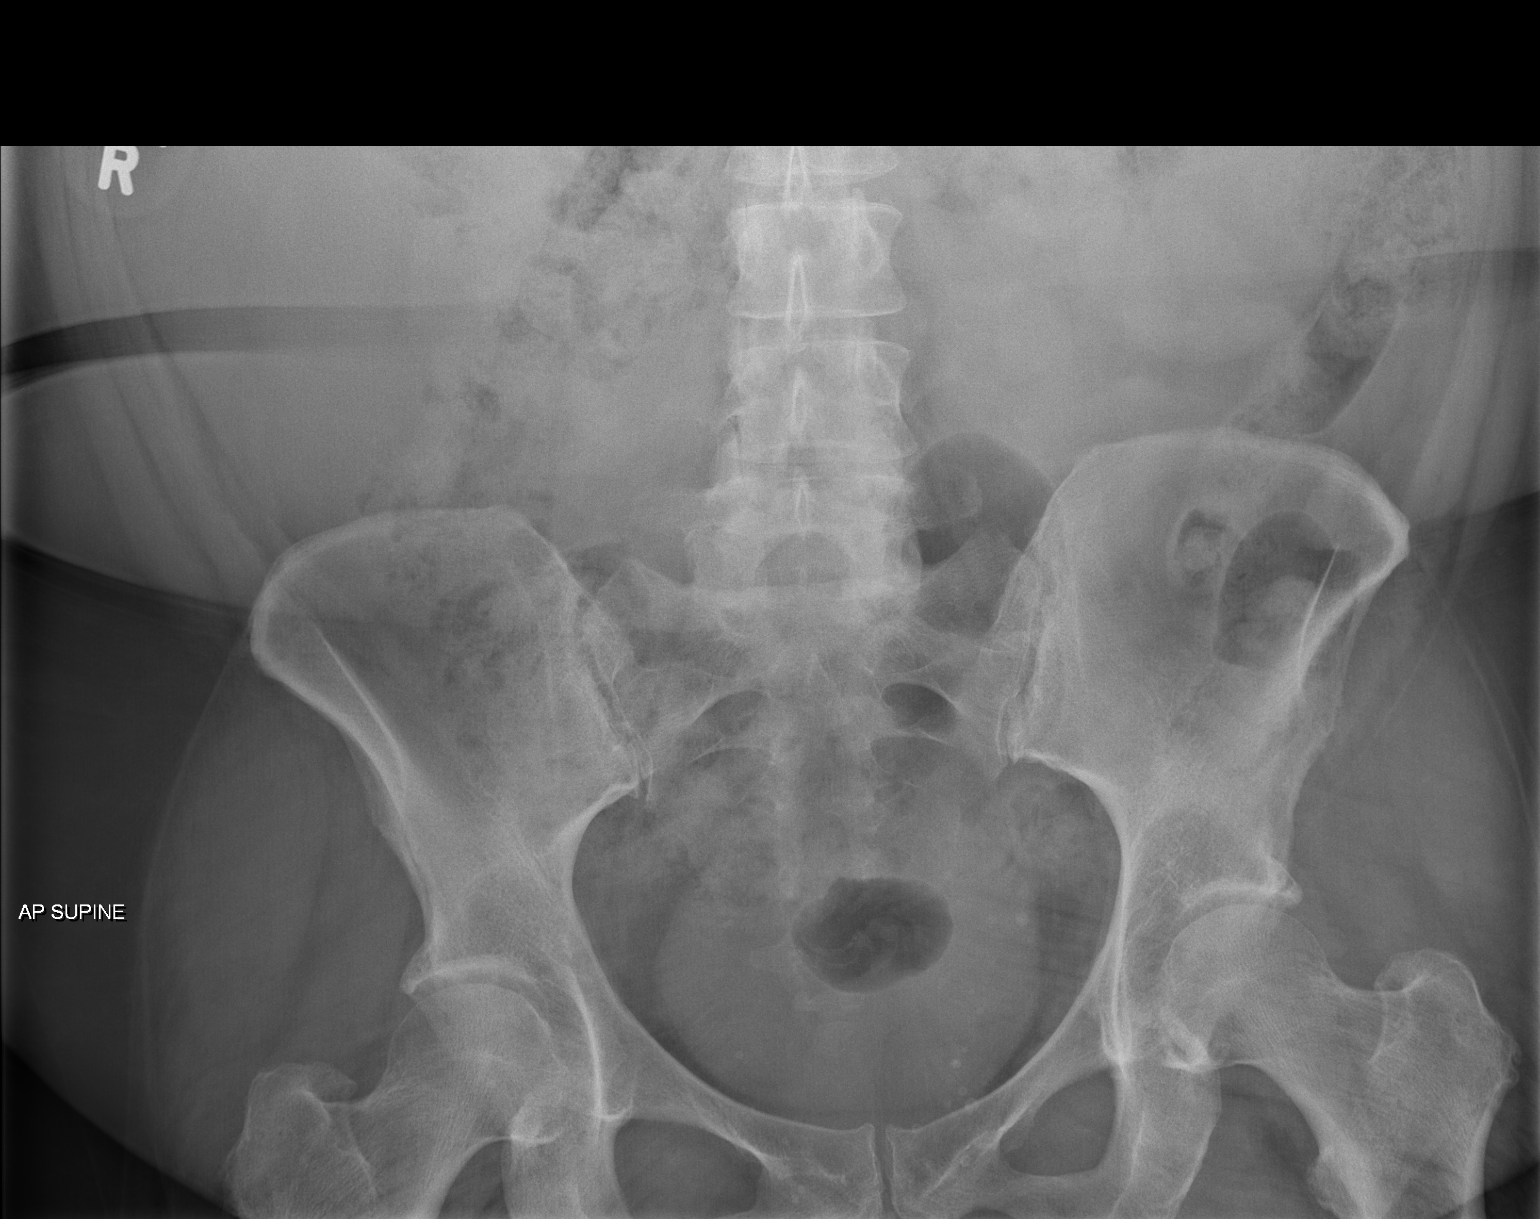

[2 of 2 positions shown; findings below may reference images not displayed]

FINDINGS: The chest is clear. The heart and mediastinal contours are
unremarkable. The osseous structures are unremarkable.

There is no bowel dilatation to suggest obstruction. There is a
moderate amount of stool throughout the colon. There is a moderate
amount of mottled material within the stomach which may reflect
recently ingested food. There is no evidence of pneumoperitoneum,
portal venous gas, or pneumatosis.

Osseous structures are unremarkable.
IMPRESSION: No active cardiopulmonary disease.

Moderate amount of stool in the colon.

## 2015-03-31 IMAGING — CR DG CHEST 2V
1 series · 2 of 2 positions shown · non-contrast
Comparison: CT chest 08/26/2013

CLINICAL DATA: Shortness of breath, chest pain, flank pain

EXAM:
CHEST  2 VIEW

[Series 1: w chest pa · 0.14mm/px · 2 of 2 slices shown]
[im 1/2]
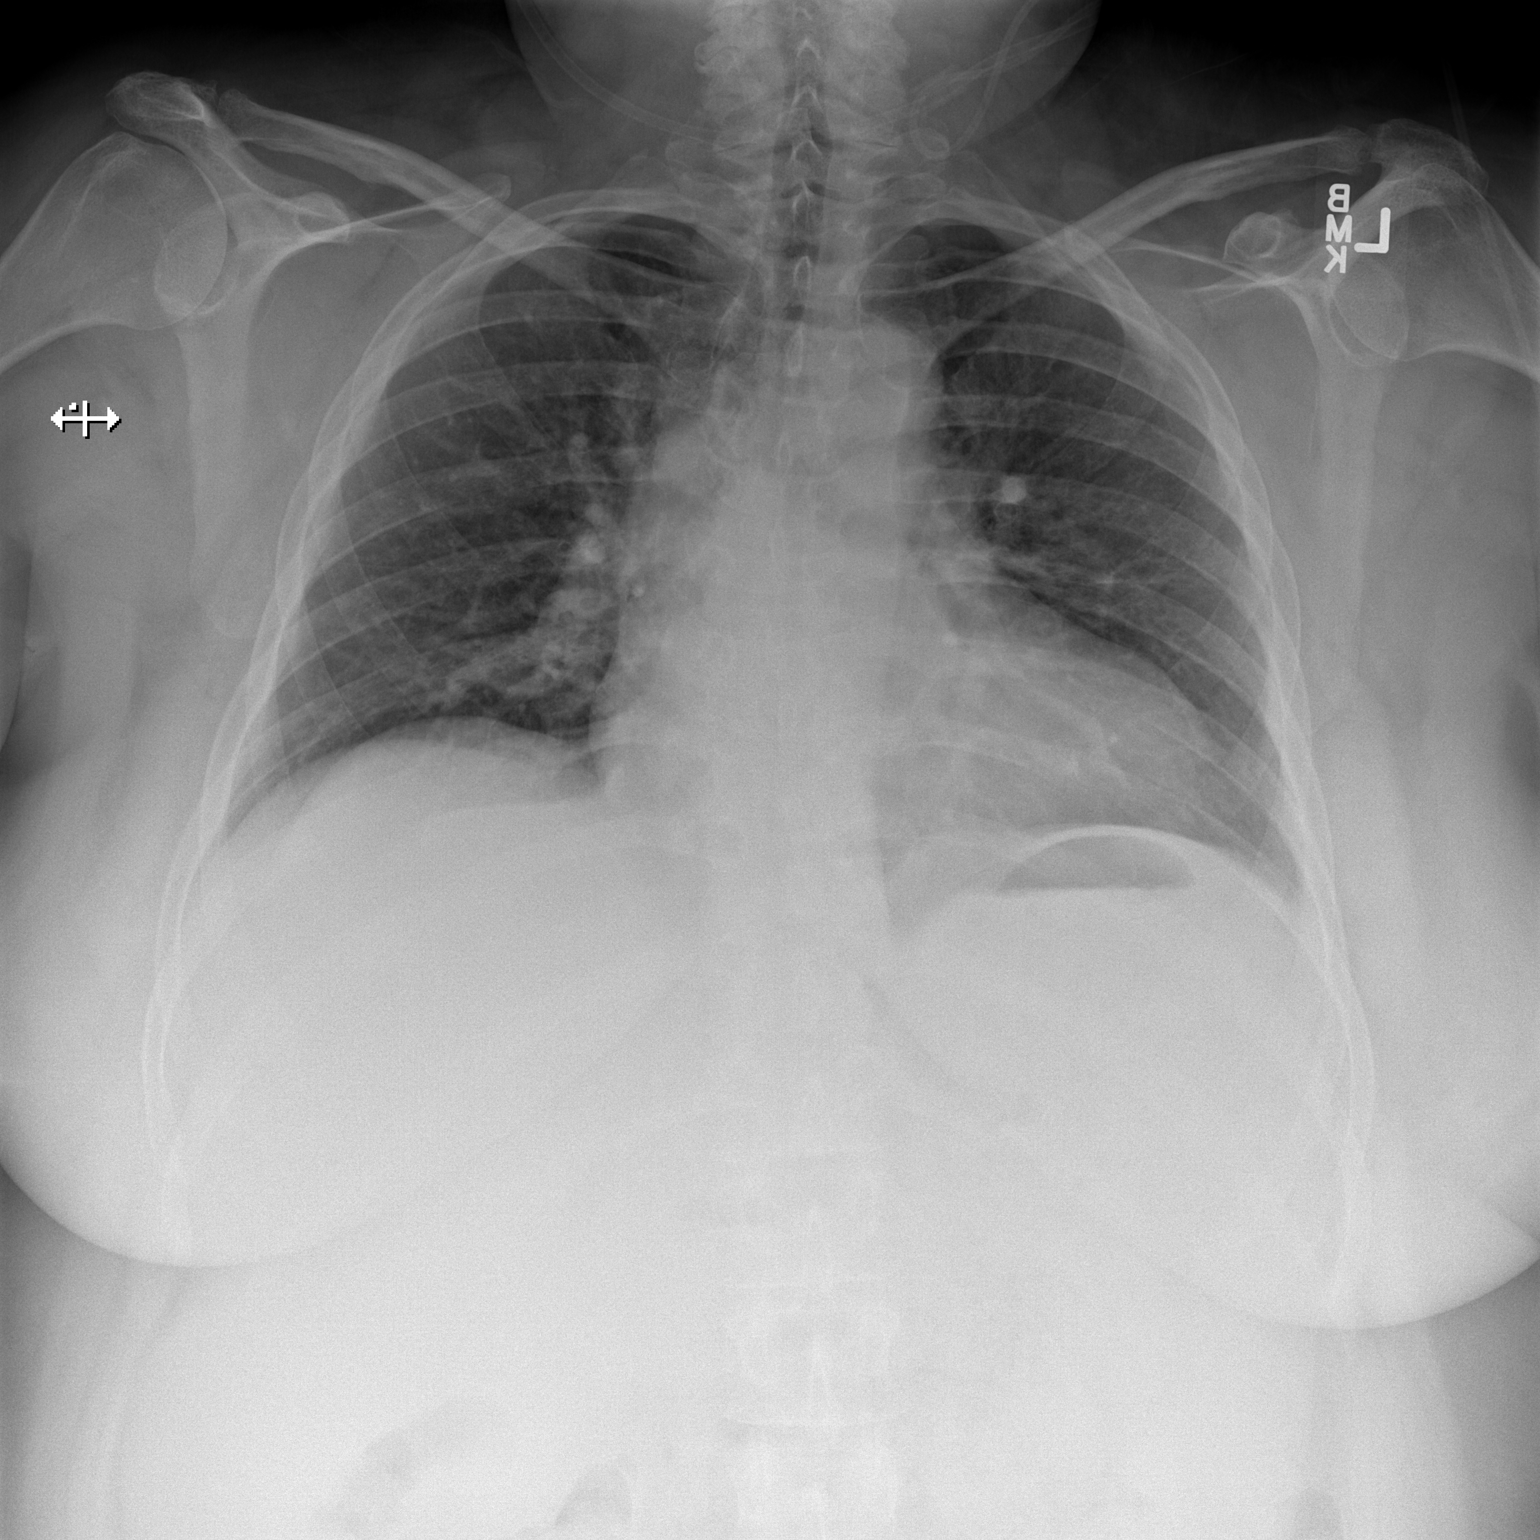
[im 2/2]
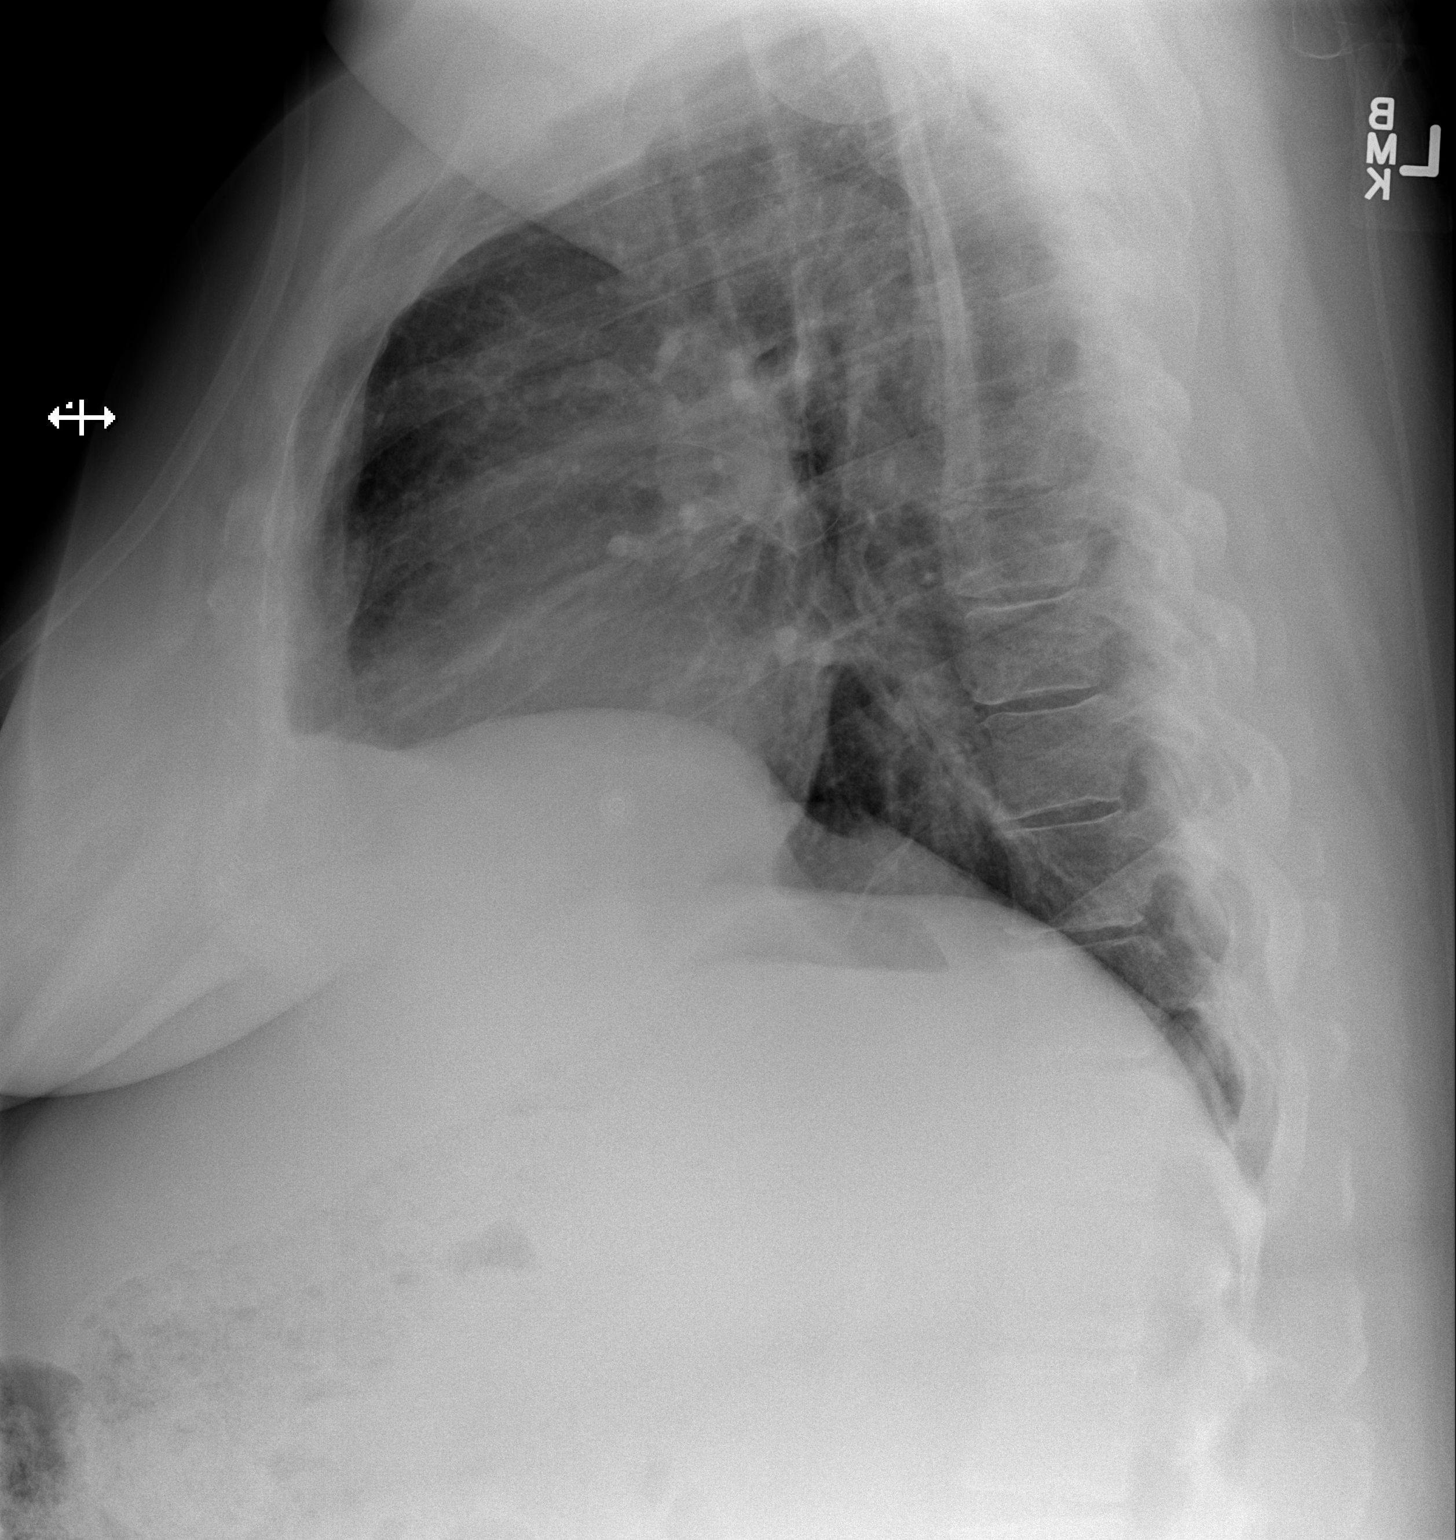

[2 of 2 positions shown; findings below may reference images not displayed]

FINDINGS: The chest is clear. The heart and mediastinal contours are
unremarkable. The osseous structures are unremarkable.

There is no bowel dilatation to suggest obstruction. There is a
moderate amount of stool throughout the colon. There is a moderate
amount of mottled material within the stomach which may reflect
recently ingested food. There is no evidence of pneumoperitoneum,
portal venous gas, or pneumatosis.

Osseous structures are unremarkable.
IMPRESSION: No active cardiopulmonary disease.

Moderate amount of stool in the colon.

## 2015-04-01 IMAGING — CT CT ABD-PELV W/ CM
2 of 5 series · 17 of 46 positions shown, 19 images · IV contrast (isovue)
Comparison: 10/18/2011

CLINICAL DATA: Initial gallbladder appears normal.
TECHNIQUE: Multidetector CT imaging of the abdomen and pelvis was performed
using the standard protocol following bolus administration of
intravenous contrast.

CONTRAST:  125 mL Isovue 370

[Series 2: routine abd pel with · axial · 0.91mm/px · z∈[-476,-46]mm · 14 of 96 slices shown, 16 images]
[im 5/96  soft-tissue]
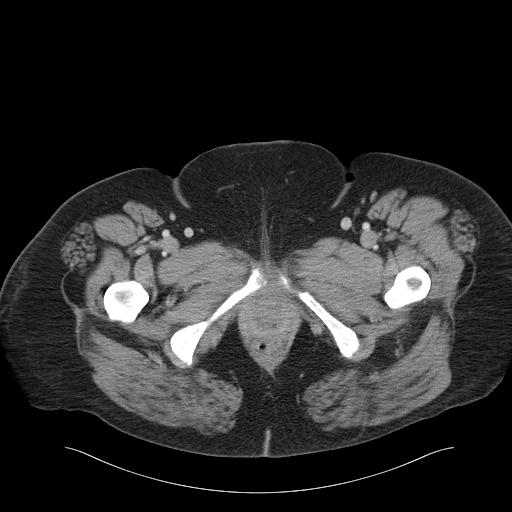
[im 5/96  bone]
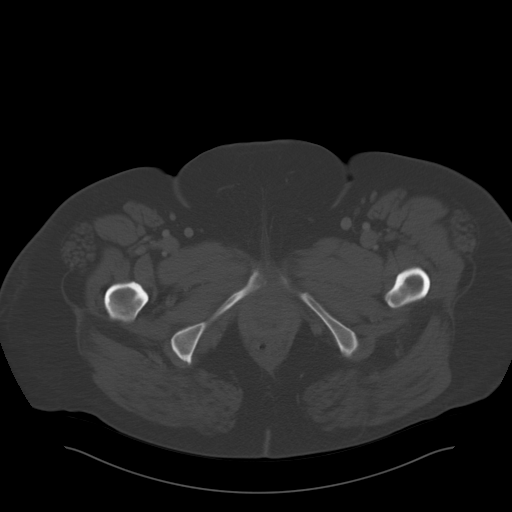
[im 15/96  soft-tissue]
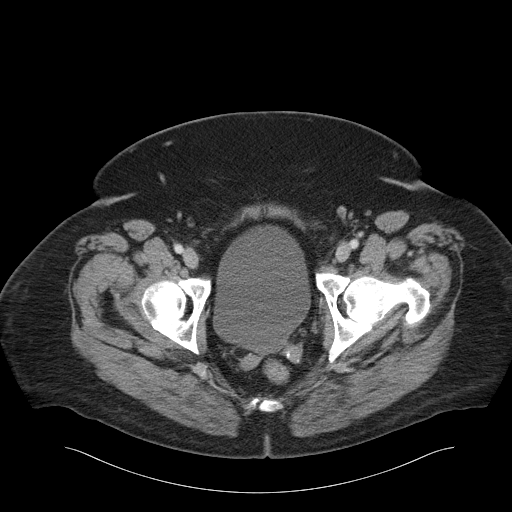
[im 20/96  soft-tissue]
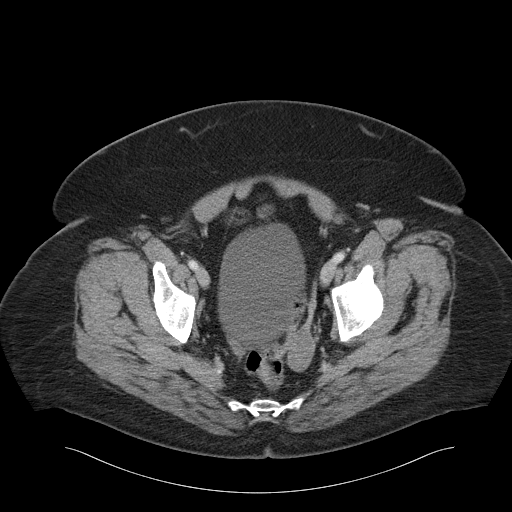
[im 24/96  soft-tissue]
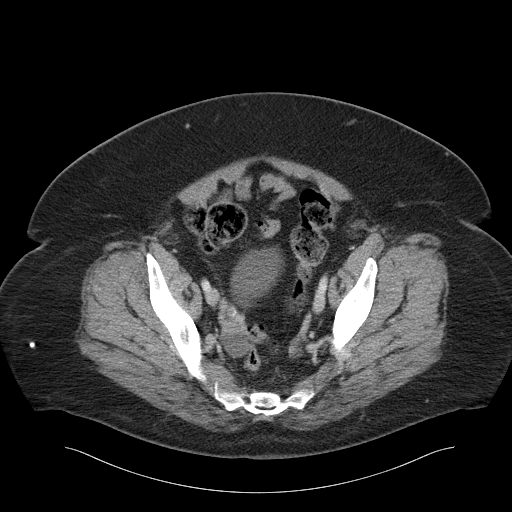
[im 34/96  soft-tissue]
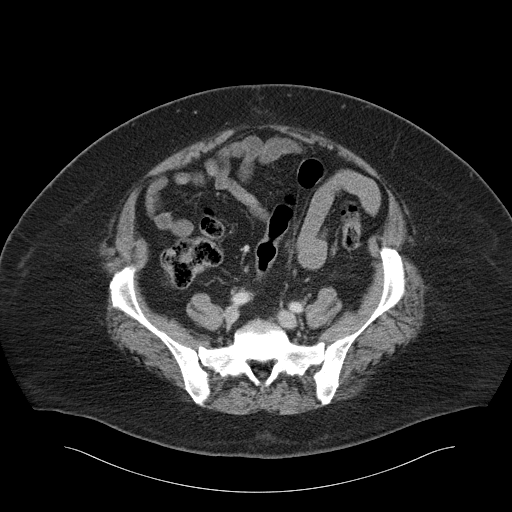
[im 39/96  soft-tissue]
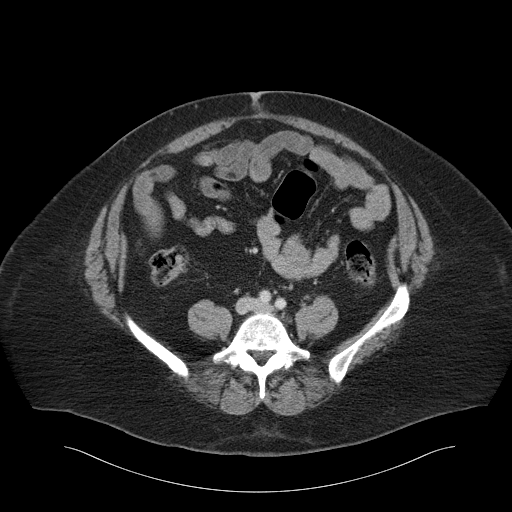
[im 43/96  soft-tissue]
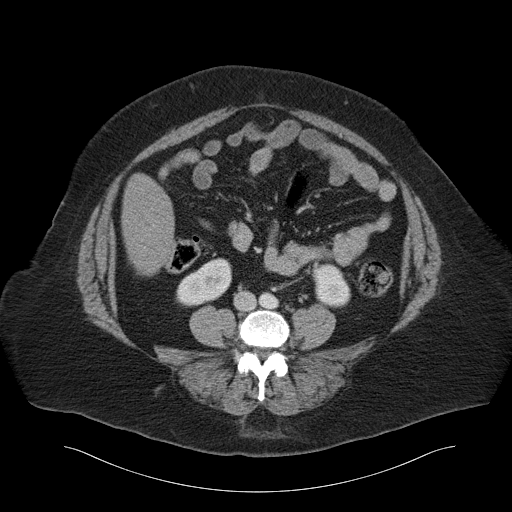
[im 53/96  soft-tissue]
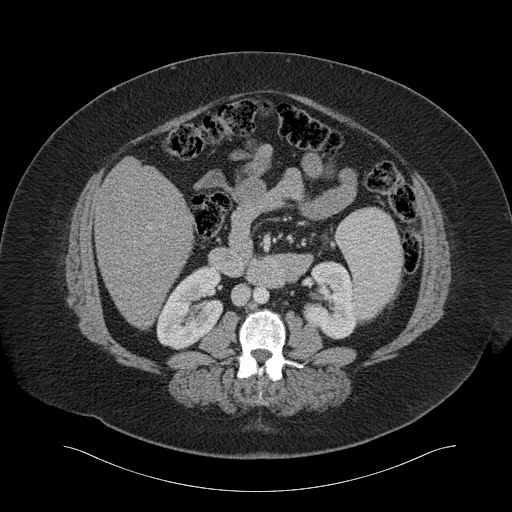
[im 58/96  soft-tissue]
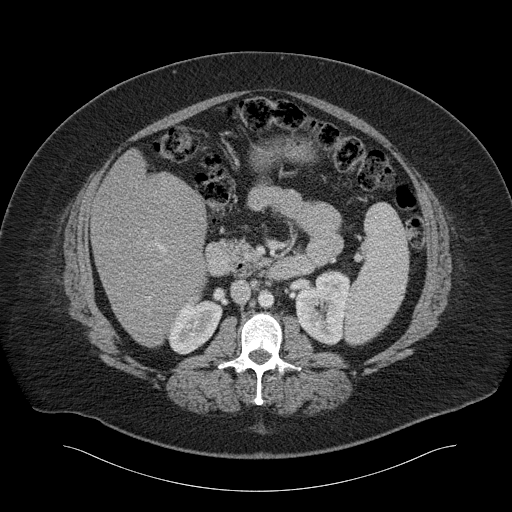
[im 58/96  bone]
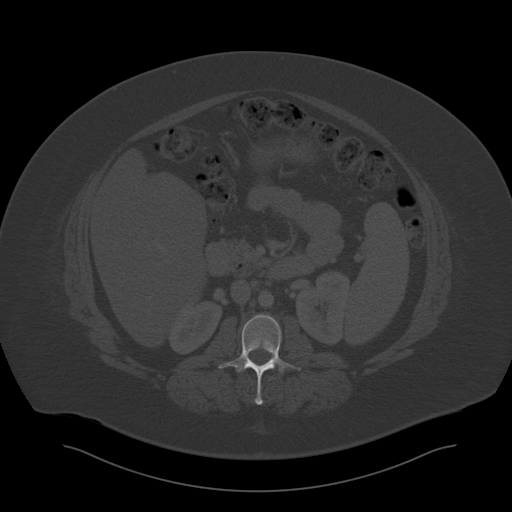
[im 62/96  soft-tissue]
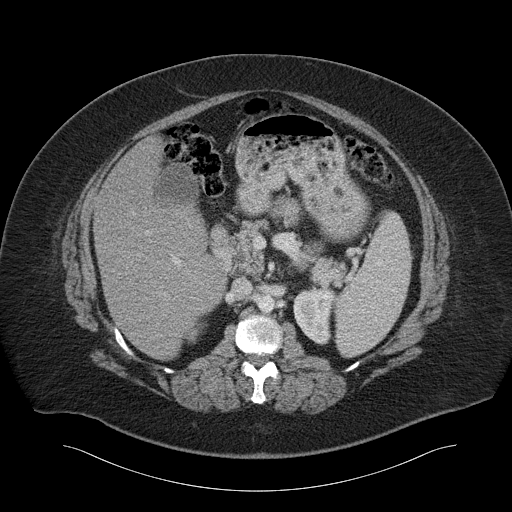
[im 72/96  soft-tissue]
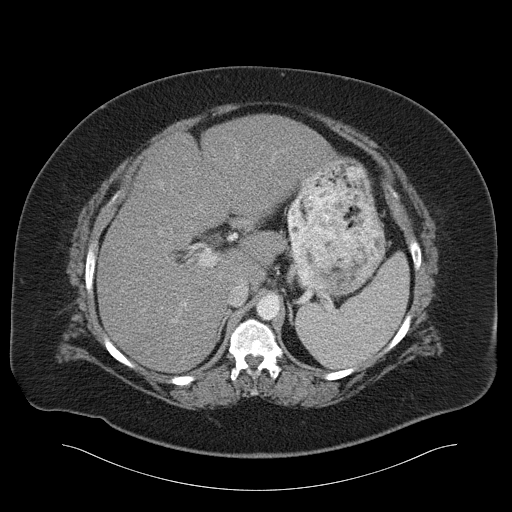
[im 77/96  soft-tissue]
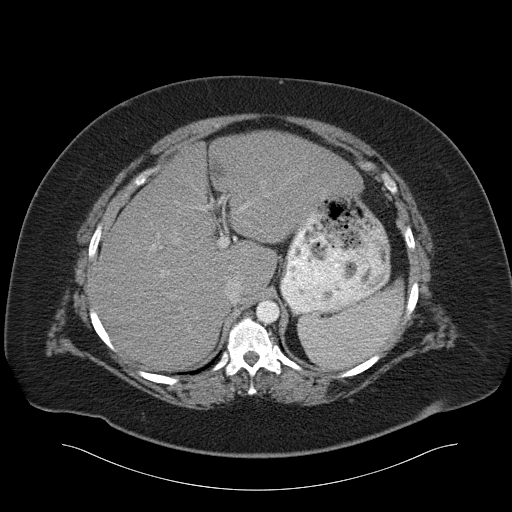
[im 81/96  soft-tissue]
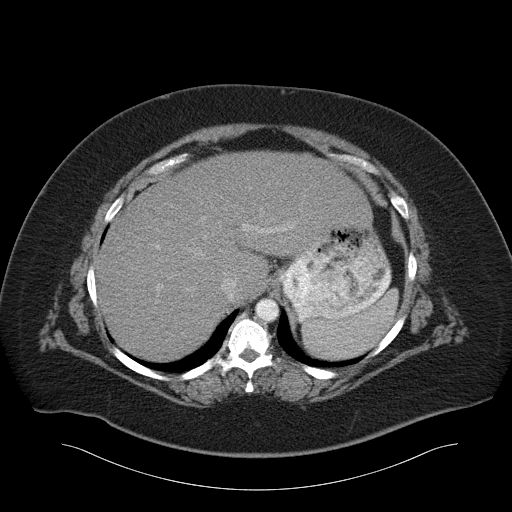
[im 91/96  soft-tissue]
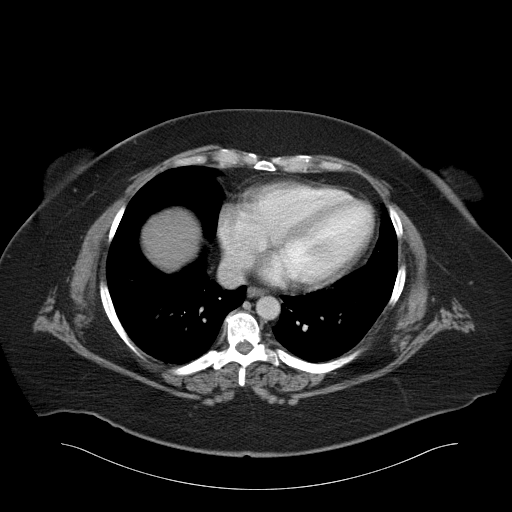

[Series 6: cor routine abd pel with · coronal · 0.93mm/px · 3 of 163 slices shown]
[im 55/163  soft-tissue]
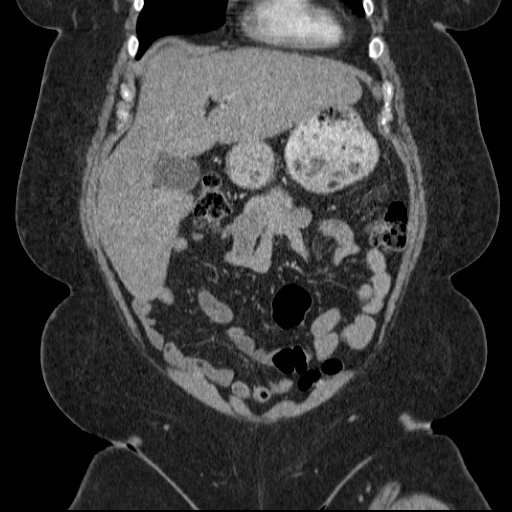
[im 73/163  soft-tissue]
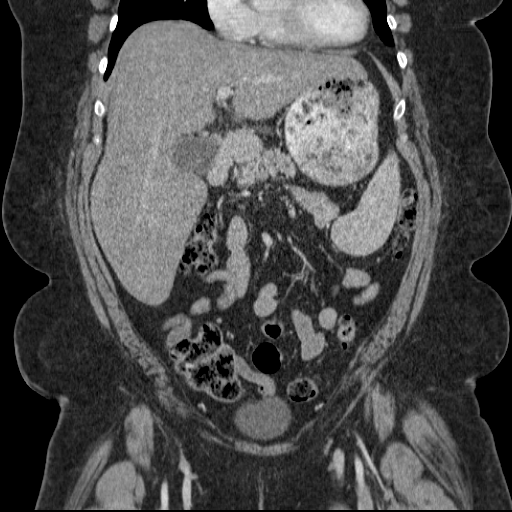
[im 91/163  soft-tissue]
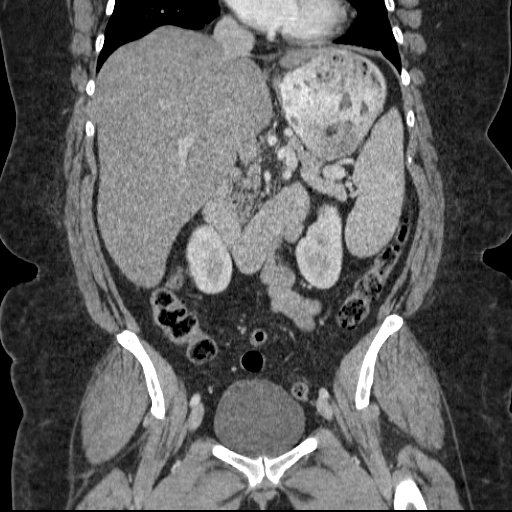

[17 of 46 positions shown; findings below may reference images not displayed]

Adrenal glands are normal.  Kidneys are normal.

Evaluation for left flank pain,Pt. states she has not urinated in
the past 30 hours. Pt. states lasix increased from 40 to 80 per PCP
yesterday. Pt. newly dx with diabetes 6 months ago. Pt. states
having heart condition where back of heart beats faster than front.
hx: diverticulosis, GERD, hysterectomy, tubal ligation, diabetes.

EXAM:
CT ABDOMEN AND PELVIS WITH CONTRAST
FINDINGS: Visualized portions of the lung bases are clear. There is mild
diffuse fatty infiltration of the liver. There is more focal low
attenuation in the left lobe adjacent to the falciform ligament,
measuring 2 x 1 cm. This is a similar appearance to 10/18/2011.
Liver is 19.4 cm in span.

There are no focal splenic abnormalities. The spleen is 17 cm in
span. Pancreas is normal. Gallbladder is normal.

Adrenal glands are normal.  Kidneys are normal.  No hydronephrosis.

Stomach and small bowel are normal. Large bowel is normal. Appendix
appears normal.

No ascites or adenopathy. Bladder is normal. Uterus appears to be
absent. Ovaries appear normal.

No acute musculoskeletal findings.
IMPRESSION: 1. Hepatosplenomegaly with fatty infiltration of the liver.
2. 2 cm low-attenuation lesion left lobe of the liver stable from
10/18/2011. This may be a cyst or hemangioma. Its stability from
5964 suggests a benign process.

## 2015-04-06 ENCOUNTER — Ambulatory Visit: Payer: Medicaid Other | Attending: Pain Medicine | Admitting: Pain Medicine

## 2015-04-06 ENCOUNTER — Encounter: Payer: Self-pay | Admitting: Pain Medicine

## 2015-04-06 VITALS — BP 105/74 | HR 95 | Temp 98.0°F | Resp 18 | Ht 62.0 in | Wt 212.0 lb

## 2015-04-06 DIAGNOSIS — M546 Pain in thoracic spine: Secondary | ICD-10-CM | POA: Insufficient documentation

## 2015-04-06 DIAGNOSIS — E538 Deficiency of other specified B group vitamins: Secondary | ICD-10-CM | POA: Diagnosis not present

## 2015-04-06 DIAGNOSIS — F329 Major depressive disorder, single episode, unspecified: Secondary | ICD-10-CM | POA: Diagnosis not present

## 2015-04-06 DIAGNOSIS — M818 Other osteoporosis without current pathological fracture: Secondary | ICD-10-CM | POA: Insufficient documentation

## 2015-04-06 DIAGNOSIS — F411 Generalized anxiety disorder: Secondary | ICD-10-CM | POA: Diagnosis not present

## 2015-04-06 DIAGNOSIS — I10 Essential (primary) hypertension: Secondary | ICD-10-CM | POA: Insufficient documentation

## 2015-04-06 DIAGNOSIS — M545 Low back pain, unspecified: Secondary | ICD-10-CM

## 2015-04-06 DIAGNOSIS — E559 Vitamin D deficiency, unspecified: Secondary | ICD-10-CM | POA: Insufficient documentation

## 2015-04-06 DIAGNOSIS — M47896 Other spondylosis, lumbar region: Secondary | ICD-10-CM | POA: Diagnosis not present

## 2015-04-06 DIAGNOSIS — Z9889 Other specified postprocedural states: Secondary | ICD-10-CM | POA: Diagnosis not present

## 2015-04-06 DIAGNOSIS — E876 Hypokalemia: Secondary | ICD-10-CM | POA: Insufficient documentation

## 2015-04-06 DIAGNOSIS — G4730 Sleep apnea, unspecified: Secondary | ICD-10-CM | POA: Diagnosis not present

## 2015-04-06 DIAGNOSIS — M47812 Spondylosis without myelopathy or radiculopathy, cervical region: Secondary | ICD-10-CM | POA: Diagnosis not present

## 2015-04-06 DIAGNOSIS — R0602 Shortness of breath: Secondary | ICD-10-CM | POA: Diagnosis not present

## 2015-04-06 DIAGNOSIS — M542 Cervicalgia: Secondary | ICD-10-CM | POA: Insufficient documentation

## 2015-04-06 DIAGNOSIS — J449 Chronic obstructive pulmonary disease, unspecified: Secondary | ICD-10-CM | POA: Diagnosis not present

## 2015-04-06 DIAGNOSIS — M5414 Radiculopathy, thoracic region: Secondary | ICD-10-CM | POA: Insufficient documentation

## 2015-04-06 DIAGNOSIS — K7290 Hepatic failure, unspecified without coma: Secondary | ICD-10-CM | POA: Diagnosis not present

## 2015-04-06 DIAGNOSIS — K219 Gastro-esophageal reflux disease without esophagitis: Secondary | ICD-10-CM | POA: Insufficient documentation

## 2015-04-06 DIAGNOSIS — G8929 Other chronic pain: Secondary | ICD-10-CM | POA: Insufficient documentation

## 2015-04-06 DIAGNOSIS — L02211 Cutaneous abscess of abdominal wall: Secondary | ICD-10-CM | POA: Diagnosis not present

## 2015-04-06 DIAGNOSIS — K7581 Nonalcoholic steatohepatitis (NASH): Secondary | ICD-10-CM | POA: Insufficient documentation

## 2015-04-06 DIAGNOSIS — Z5181 Encounter for therapeutic drug level monitoring: Secondary | ICD-10-CM

## 2015-04-06 DIAGNOSIS — M4806 Spinal stenosis, lumbar region: Secondary | ICD-10-CM | POA: Insufficient documentation

## 2015-04-06 DIAGNOSIS — G5603 Carpal tunnel syndrome, bilateral upper limbs: Secondary | ICD-10-CM | POA: Diagnosis not present

## 2015-04-06 DIAGNOSIS — Z79891 Long term (current) use of opiate analgesic: Secondary | ICD-10-CM | POA: Insufficient documentation

## 2015-04-06 DIAGNOSIS — E119 Type 2 diabetes mellitus without complications: Secondary | ICD-10-CM | POA: Insufficient documentation

## 2015-04-06 DIAGNOSIS — M79606 Pain in leg, unspecified: Secondary | ICD-10-CM | POA: Diagnosis present

## 2015-04-06 DIAGNOSIS — F1190 Opioid use, unspecified, uncomplicated: Secondary | ICD-10-CM

## 2015-04-06 DIAGNOSIS — M5410 Radiculopathy, site unspecified: Secondary | ICD-10-CM

## 2015-04-06 DIAGNOSIS — R55 Syncope and collapse: Secondary | ICD-10-CM | POA: Insufficient documentation

## 2015-04-06 DIAGNOSIS — M549 Dorsalgia, unspecified: Secondary | ICD-10-CM | POA: Diagnosis present

## 2015-04-06 MED ORDER — OXYCODONE-ACETAMINOPHEN 10-325 MG PO TABS: 1.0000 | ORAL_TABLET | Freq: Every day | ORAL | Status: DC | PRN

## 2015-04-06 NOTE — Progress Notes (Signed)
Safety precautions to be maintained throughout the outpatient stay will include: orient to surroundings, keep bed in low position, maintain call bell within reach at all times, provide assistance with transfer out of bed and ambulation.Endocet pill count # 3/150  Filled 03-07-15

## 2015-04-06 NOTE — Progress Notes (Signed)
Patient's Name: Lori Mckenzie Patient type: Established  MRN: [id number] Service setting: Ambulatory outpatient  DOB: [birth year]   DOS: 04/06/2015    Primary Reason(s) for Visit: Encounter for prescription drug management (Level of risk: moderate) CC: Neck Pain; Leg Pain; and Back Pain   HPI  Lori Mckenzie is a 50 y.o. year old, female patient, who returns today as an established patient. She has Type 2 diabetes mellitus ([place]); COPD (chronic obstructive pulmonary disease) (HCC); HTN (hypertension); GERD (gastroesophageal reflux disease); OSA on CPAP; Anxiety; Steatohepatitis; Bile leak, postoperative; Dysthymia; Other social stressor; Abdominal abscess ([place]); Ascites; NASH (nonalcoholic steatohepatitis); Hypokalemia; Pyrexia; Chronic pain; Opiate use (75 MME/Day); Long term prescription opiate use; Long term current use of opiate analgesic; Encounter for therapeutic drug level monitoring; Chronic epigastric abdominal pain; Chronic low back pain (Location of Primary Source of Pain) (Bilateral) (R>L); Chronic neck pain (Location of Tertiary source of pain) (Bilateral) (R>L); Breath shortness; Diastolic dysfunction; Clinical depression; Chronic obstructive pulmonary disease ([place]); Airway hyperreactivity; Anxiety state; Acid reflux; Essential (primary) hypertension; 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, L1-2, & L4-5); Lumbar and sacral osteoarthritis; Myofascial pain; Sleep apnea; Fall; Drowsiness; Episode of syncope; Subacute lumbar radiculopathy (left side) (S1 dermatome); Vitamin D deficiency; B12 deficiency; Folate deficiency; Thoracic radiculitis; Major depressive disorder with single episode ([place]); Elevated sedimentation rate; Elevated C-reactive protein (CRP); Lumbar facet syndrome (Location of Primary Source of Pain) (Bilateral) (R>L); Cervical spondylosis; Chronic feet pain (Location of Secondary source of pain) (Bilateral) (R>L); Lumbar spondylosis; Encounter for chronic pain management; Chronic shoulder pain  (Bilateral) (R>L); Chronic carpal tunnel syndrome (Bilateral); Chronic hip pain (Bilateral) (L>R); Chronic upper back pain (Bilateral) (L>R); Osteoporosis, idiopathic; Abnormal MRI, lumbar spine (02/03/2015); Hepatic encephalopathy ([place]); Pain management; and Radicular pain of thoracic region on her problem list.. Her primarily concern today is the Neck Pain; Leg Pain; and Back Pain   Pain Assessment: Self-Reported Pain Score: 8 , clinically she looks like a 2-3/10. Reported level is inconsistent with clinical obrservations Pain Type: Chronic pain Pain Location: Back Pain Orientation: Mid, Lower Pain Descriptors / Indicators: Burning, Radiating, Stabbing Pain Frequency: Constant  The patient comes in today clinics today for pharmacological management of her chronic pain.  Date of Last Visit: 03/05/15 Service Provided on Last Visit: Med Refill  Controlled Substance Pharmacotherapy Assessment  Analgesic: Oxycodone/APAP 10/325 5 tablets per day (50 mg/day) Pill Count: count # 3/150 Filled 03-07-15 MME/day: 75 mg/day.  Pharmacokinetics: Onset of action (Liberation/Absorption): Within expected pharmacological parameters Time to Peak effect (Distribution): Timing and results are as within normal expected parameters Duration of action (Metabolism/Excretion): Within normal limits for medication Pharmacodynamics: Analgesic Effect: More than 50% Activity Facilitation: Medication(s) allow patient to sit, stand, walk, and do the basic ADLs Perceived Effectiveness: Described as relatively effective, allowing for increase in activities of daily living (ADL) Side-effects or Adverse reactions: None reported Monitoring: [place] PMP: Online review of the past 24-monthperiod conducted. Compliant with practice rules and regulations UDS Results/interpretation: Last UDS done on 03/05/2015 came back within normal limits with no unexpected results. Medication Assessment Form: Reviewed. Patient indicates being  compliant with therapy Treatment compliance: Compliant Risk Assessment: Aberrant Behavior: None observed today Substance Use Disorder (SUD) Risk Level: Low Risk of opioid abuse or dependence: 0.7-3.0% with doses ? 36 MME/day and 6.1-26% with doses ? 120 MME/day. Opioid Risk Tool (ORT) Score: Total Score: 0 Low Risk for SUD (Score <3) Depression Scale Score: PHQ-2: PHQ-2 Total Score: 0 No depression (0) PHQ-9: PHQ-9 Total Score: 0 No depression (0-4)  Pharmacologic Plan: No change in therapy, at this time  Laboratory Chemistry  Inflammation Markers Lab Results  Component Value Date   ESRSEDRATE 35* 01/08/2015   CRP 2.8* 01/08/2015    Renal Function Lab Results  Component Value Date   BUN 14 03/11/2015   CREATININE 0.58 03/11/2015   GFRAA >60 03/11/2015   GFRNONAA >60 03/11/2015    Hepatic Function Lab Results  Component Value Date   AST 61* 03/11/2015   ALT 62* 03/11/2015   ALBUMIN 4.2 03/11/2015    Electrolytes Lab Results  Component Value Date   NA 136 03/11/2015   K 3.6 03/11/2015   CL 99* 03/11/2015   CALCIUM 9.5 03/11/2015   MG 1.9 01/08/2015    Pain Modulating Vitamins Lab Results  Component Value Date   VD25OH 14.3* 01/08/2015   VD125OH2TOT 28.8 01/08/2015   VITAMINB12 172* 01/08/2015    Coagulation Parameters Lab Results  Component Value Date   INR 1.16 09/07/2014   LABPROT 15.0 09/07/2014    Note: I personally reviewed the above data. Results shared with patient.  Meds  The patient has a current medication list which includes the following prescription(s): albuterol, alprazolam, aspirin ec, citalopram, dicyclomine, famotidine, fluticasone, intrinsi b12-folate, furosemide, gabapentin, glipizide, hydrochlorothiazide, lactulose, loperamide, nystatin, omeprazole, ondansetron, oxycodone-acetaminophen, promethazine, rifaximin, rizatriptan, spironolactone, vitamin b-12, lactulose, oxycodone-acetaminophen, and oxycodone-acetaminophen.  Current  Outpatient Prescriptions on File Prior to Visit  Medication Sig  . albuterol (PROVENTIL HFA;VENTOLIN HFA) 108 (90 BASE) MCG/ACT inhaler Inhale 2 puffs into the lungs every 4 (four) hours as needed for wheezing or shortness of breath.  . ALPRAZolam (XANAX) 1 MG tablet Take 1-2 mg by mouth 3 (three) times daily as needed for anxiety.  Marland Kitchen aspirin EC 81 MG tablet Take 81 mg by mouth daily.   . citalopram (CELEXA) 20 MG tablet Take 20 mg by mouth at bedtime.   . dicyclomine (BENTYL) 10 MG capsule Take 10 mg by mouth 4 (four) times daily -  before meals and at bedtime.   . famotidine (PEPCID) 20 MG tablet Take 20 mg by mouth daily.   . fluticasone (FLONASE) 50 MCG/ACT nasal spray Place 1-2 sprays into both nostrils daily as needed for rhinitis.   Derald Macleod Factor (INTRINSI B12-FOLATE) 025-852-77 MCG-MCG-MG TABS Take 1 tablet by mouth daily. Reported on 04/06/2015  . furosemide (LASIX) 40 MG tablet Take 40 mg by mouth daily.   Marland Kitchen gabapentin (NEURONTIN) 300 MG capsule Take 900 mg by mouth 4 (four) times daily as needed (for pain).   Marland Kitchen glipiZIDE (GLUCOTROL) 5 MG tablet Take 1 tablet (5 mg total) by mouth daily before breakfast.  . hydrochlorothiazide (HYDRODIURIL) 25 MG tablet Take 25 mg by mouth at bedtime.   Marland Kitchen loperamide (IMODIUM) 2 MG capsule Take 1 capsule (2 mg total) by mouth every 6 (six) hours as needed for diarrhea or loose stools.  . nystatin (MYCOSTATIN) powder Apply 1 g topically 3 (three) times daily as needed (for irritation).   Marland Kitchen omeprazole (PRILOSEC) 20 MG capsule Take 20 mg by mouth 2 (two) times daily before a meal.  . ondansetron (ZOFRAN) 4 MG tablet Take 1 tablet (4 mg total) by mouth every 6 (six) hours as needed for nausea.  . promethazine (PHENERGAN) 12.5 MG tablet Take 12.5 mg by mouth every 6 (six) hours as needed for nausea or vomiting.   . rifaximin (XIFAXAN) 550 MG TABS tablet Take 550 mg by mouth 2 (two) times daily.   . rizatriptan (MAXALT) 10 MG tablet Take  10 mg by  mouth as needed for migraine.   Marland Kitchen spironolactone (ALDACTONE) 100 MG tablet Take 100 mg by mouth daily.   . vitamin B-12 (CYANOCOBALAMIN) 1000 MCG tablet Take 2,000 mcg by mouth daily.  Marland Kitchen lactulose (CHRONULAC) 10 GM/15ML solution Take 30 mLs (20 g total) by mouth 3 (three) times daily.  . [DISCONTINUED] SUMAtriptan (IMITREX) 50 MG tablet Take 50 mg by mouth every 2 (two) hours as needed. For migraines   No current facility-administered medications on file prior to visit.    ROS  Constitutional: Afebrile, no chills, well hydrated and well nourished Gastrointestinal: No upper or lower GI bleeding, no nausea, no vomiting and no acute GI distress Musculoskeletal: No acute joint swelling or redness, no acute loss of range of motion and no acute onset weakness Neurological: Denies any acute onset apraxia, no episodes of paralysis, no acute loss of coordination, no acute loss of consciousness and no acute onset aphasia, dysarthria, agnosia, or amnesia  Allergies  Lori Mckenzie is allergic to tape and vicodin.  [place]  Medical:  Lori Mckenzie  has a past medical history of Brittle bone disease; Asthma; Thyroid disease; Hypertension; Back pain; Diabetes mellitus without complication ([place]); Chronic kidney disease; Anxiety; GERD (gastroesophageal reflux disease); Sleep apnea; COPD (chronic obstructive pulmonary disease) ([place]); Hypothyroidism; Cervical disc disease; TIA (transient ischemic attack); Cancer ([place]); Collagen vascular disease ([place]); Ascites; NASH (nonalcoholic steatohepatitis); Sleep apnea; Migraines; and Respiratory infection. Family: family history includes Heart disease in her brother and sister; Lung cancer in her father; Ulcers in her father and sister. Surgical:  has past surgical history that includes Abdominal hysterectomy; Tubal ligation; Colonoscopy with propofol (N/A, 06/23/2014); Esophagogastroduodenoscopy (N/A, 06/23/2014); Cholecystectomy (N/A, 07/15/2014); ERCP (N/A, 07/16/2014); Wisdom tooth  extraction; and ERCP (N/A, 10/03/2014). Tobacco:  reports that she has never smoked. She has never used smokeless tobacco. Alcohol:  reports that she does not drink alcohol. Drug:  reports that she does not use illicit drugs.  Physical Examination  Constitutional Vitals:  Today's Vitals   04/06/15 0951 04/06/15 0953  BP: 105/74   Pulse: 95   Temp: 98 F (36.7 C)   Resp: 18   Height: 5' 2"  (1.575 m)   Weight: 212 lb (96.163 kg)   SpO2: 92%   PainSc: 8  8   PainLoc: Neck     Calculated BMI: Body mass index is 38.77 kg/(m^2). Severe obesity (Class II) (35-39.9 kg/m2) - 136% higher incidence of chronic pain General appearance: alert, cooperative, appears stated age, no distress and moderately obese Eyes: PERLA Respiratory: No evidence respiratory distress, no audible rales or ronchi and no use of accessory muscles of respiration  Cervical Spine Inspection: Normal anatomy Alignment: Symetrical AROM: Adequate  Upper Extremities Right  Left  Inspection: No gross anomalies detected  Inspection: No gross anomalies detected  AROM: Adequate  AROM: Adequate  Sensory: Normal  Sensory: Normal  Motor: Unremarkable       Motor: Unremarkable        Thoracic Spine Inspection: No gross anomalies detected Alignment: Symetrical AROM: Adequate  Lumbar Spine Inspection: No gross anomalies detected Alignment: Symetrical AROM: Decreased  Gait: Antalgic gait.  Lower Extremities Right  Left  Inspection: No gross anomalies detected  Inspection: No gross anomalies detected  AROM: Adequate  AROM: Adequate  Sensory:  Normal  Sensory:  Normal  Motor: Unremarkable       Motor: Unremarkable        Assessment & Plan  Primary Diagnosis & Pertinent Problem List: The primary  encounter diagnosis was Chronic pain. Diagnoses of Encounter for therapeutic drug level monitoring, Long term current use of opiate analgesic, Opiate use (75 MME/Day), Chronic low back pain (Location of Primary Source of  Pain) (Bilateral) (R>L), and Radicular pain of thoracic region were also pertinent to this visit.  Visit Diagnosis: 1. Chronic pain   2. Encounter for therapeutic drug level monitoring   3. Long term current use of opiate analgesic   4. Opiate use (75 MME/Day)   5. Chronic low back pain (Location of Primary Source of Pain) (Bilateral) (R>L)   6. Radicular pain of thoracic region     Problem-specific Plan(s): No problem-specific assessment & plan notes found for this encounter.   Plan of Care  Pharmacotherapy (Medications Ordered): Meds ordered this encounter  Medications  . oxyCODONE-acetaminophen (PERCOCET) 10-325 MG tablet    Sig: Take 1 tablet by mouth 5 (five) times daily as needed for pain.    Dispense:  150 tablet    Refill:  0    Do not place this medication, or any other prescription from our practice, on "Automatic Refill". Patient may have prescription filled one day early if pharmacy is closed on scheduled refill date. Do not fill until: 05/06/15 To last until: 06/05/15  . oxyCODONE-acetaminophen (PERCOCET) 10-325 MG tablet    Sig: Take 1 tablet by mouth 5 (five) times daily as needed for pain.    Dispense:  150 tablet    Refill:  0    Do not place this medication, or any other prescription from our practice, on "Automatic Refill". Patient may have prescription filled one day early if pharmacy is closed on scheduled refill date. Do not fill until: 06/05/15 To last until: 07/04/15  . oxyCODONE-acetaminophen (PERCOCET) 10-325 MG tablet    Sig: Take 1 tablet by mouth 5 (five) times daily as needed for pain. Do not take for 11 hours after taking Xanax. Max.: 5/day.    Dispense:  150 tablet    Refill:  0    Do not place this medication, or any other prescription from our practice, on "Automatic Refill". Patient may have prescription filled one day early if pharmacy is closed on scheduled refill date. Do not fill until: 04/06/15 To last until: 05/06/15    [facility]  Procedure Ordered: Orders Placed This Encounter  Procedures  . THORACIC EPIDURAL STEROID INJECTION    Standing Status: Standing     Number of Occurrences: 1     Standing Expiration Date: 04/05/2016    Scheduling Instructions:     Side: Right-sided (T5-6)     Sedation: With Sedation.     Timeframe: PRN Procedure. Patient will call to schedule.    Order Specific Question:  Where will this procedure be performed?    Answer:  [facility]    Imaging Ordered: None  Interventional Therapies: Scheduled:  None at this time. Considering:  None at this time. PRN Procedures:  None at this time.   Referral(s) or Consult(s): None at this time.  Medications administered during this visit: Lori Mckenzie had no medications administered during this visit.  No future appointments.  Primary Care Physician: Ardine Eng, MD Location: [facility] Facility Note by: Kathlen Brunswick Dossie Arbour, M.D, DABA, DABAPM, DABPM, DABIPP, FIPP  Pain Score Disclaimer: We use the NRS-11 scale. This is a self-reported, subjective measurement of pain severity with only modest accuracy. It is used primarily to identify changes within a particular patient. It must be understood that outpatient  pain scales are significantly less accurate that those used for research, where they can be applied under ideal controlled circumstances with minimal exposure to variables. In reality, the score is likely to be a combination of pain intensity and pain affect, where pain affect describes the degree of emotional arousal or changes in action readiness caused by the sensory experience of pain. Factors such as social and work situation, setting, emotional state, anxiety levels, expectation, and prior pain experience may influence pain perception and show large inter-individual differences that may also be affected by time variables.

## 2015-04-06 NOTE — Patient Instructions (Signed)
Epidural Steroid Injection Patient Information  Description: The epidural space surrounds the nerves as they exit the spinal cord.  In some patients, the nerves can be compressed and inflamed by a bulging disc or a tight spinal canal (spinal stenosis).  By injecting steroids into the epidural space, we can bring irritated nerves into direct contact with a potentially helpful medication.  These steroids act directly on the irritated nerves and can reduce swelling and inflammation which often leads to decreased pain.  Epidural steroids may be injected anywhere along the spine and from the neck to the low back depending upon the location of your pain.   After numbing the skin with local anesthetic (like Novocaine), a small needle is passed into the epidural space slowly.  You may experience a sensation of pressure while this is being done.  The entire block usually last less than 10 minutes.  Conditions which may be treated by epidural steroids:   Low back and leg pain  Neck and arm pain  Spinal stenosis  Post-laminectomy syndrome  Herpes zoster (shingles) pain  Pain from compression fractures  Preparation for the injection:  1. Do not eat any solid food or dairy products within 8 hours of your appointment.  2. You may drink clear liquids up to 3 hours before appointment.  Clear liquids include water, black coffee, juice or soda.  No milk or cream please. 3. You may take your regular medication, including pain medications, with a sip of water before your appointment  Diabetics should hold regular insulin (if taken separately) and take 1/2 normal NPH dos the morning of the procedure.  Carry some sugar containing items with you to your appointment. 4. A driver must accompany you and be prepared to drive you home after your procedure.  5. Bring all your current medications with your. 6. An IV may be inserted and sedation may be given at the discretion of the physician.   7. A blood pressure  cuff, EKG and other monitors will often be applied during the procedure.  Some patients may need to have extra oxygen administered for a short period. 8. You will be asked to provide medical information, including your allergies, prior to the procedure.  We must know immediately if you are taking blood thinners (like Coumadin/Warfarin)  Or if you are allergic to IV iodine contrast (dye). We must know if you could possible be pregnant.  Possible side-effects:  Bleeding from needle site  Infection (rare, may require surgery)  Nerve injury (rare)  Numbness & tingling (temporary)  Difficulty urinating (rare, temporary)  Spinal headache ( a headache worse with upright posture)  Light -headedness (temporary)  Pain at injection site (several days)  Decreased blood pressure (temporary)  Weakness in arm/leg (temporary)  Pressure sensation in back/neck (temporary)  Call if you experience:  Fever/chills associated with headache or increased back/neck pain.  Headache worsened by an upright position.  New onset weakness or numbness of an extremity below the injection site  Hives or difficulty breathing (go to the emergency room)  Inflammation or drainage at the infection site  Severe back/neck pain  Any new symptoms which are concerning to you  Please note:  Although the local anesthetic injected can often make your back or neck feel good for several hours after the injection, the pain will likely return.  It takes 3-7 days for steroids to work in the epidural space.  You may not notice any pain relief for at least that one week.    If effective, we will often do a series of three injections spaced 3-6 weeks apart to maximally decrease your pain.  After the initial series, we generally will wait several months before considering a repeat injection of the same type.  If you have any questions, please call [phone] [facility] 

## 2015-04-29 ENCOUNTER — Emergency Department
Admission: EM | Admit: 2015-04-29 | Discharge: 2015-04-29 | Disposition: A | Discharge status: Home / Self Care | Payer: Medicaid Other | Attending: Emergency Medicine | Admitting: Emergency Medicine

## 2015-04-29 ENCOUNTER — Encounter: Payer: Self-pay | Admitting: Emergency Medicine

## 2015-04-29 DIAGNOSIS — G4700 Insomnia, unspecified: Secondary | ICD-10-CM | POA: Diagnosis not present

## 2015-04-29 DIAGNOSIS — F329 Major depressive disorder, single episode, unspecified: Secondary | ICD-10-CM | POA: Diagnosis not present

## 2015-04-29 DIAGNOSIS — T424X1A Poisoning by benzodiazepines, accidental (unintentional), initial encounter: Secondary | ICD-10-CM | POA: Insufficient documentation

## 2015-04-29 DIAGNOSIS — M549 Dorsalgia, unspecified: Secondary | ICD-10-CM | POA: Insufficient documentation

## 2015-04-29 DIAGNOSIS — Z8673 Personal history of transient ischemic attack (TIA), and cerebral infarction without residual deficits: Secondary | ICD-10-CM | POA: Diagnosis not present

## 2015-04-29 DIAGNOSIS — Z7982 Long term (current) use of aspirin: Secondary | ICD-10-CM | POA: Diagnosis not present

## 2015-04-29 DIAGNOSIS — E1122 Type 2 diabetes mellitus with diabetic chronic kidney disease: Secondary | ICD-10-CM | POA: Insufficient documentation

## 2015-04-29 DIAGNOSIS — T507X1A Poisoning by analeptics and opioid receptor antagonists, accidental (unintentional), initial encounter: Secondary | ICD-10-CM | POA: Insufficient documentation

## 2015-04-29 DIAGNOSIS — Z7951 Long term (current) use of inhaled steroids: Secondary | ICD-10-CM | POA: Diagnosis not present

## 2015-04-29 DIAGNOSIS — I129 Hypertensive chronic kidney disease with stage 1 through stage 4 chronic kidney disease, or unspecified chronic kidney disease: Secondary | ICD-10-CM | POA: Diagnosis not present

## 2015-04-29 DIAGNOSIS — T50901A Poisoning by unspecified drugs, medicaments and biological substances, accidental (unintentional), initial encounter: Secondary | ICD-10-CM

## 2015-04-29 DIAGNOSIS — F419 Anxiety disorder, unspecified: Secondary | ICD-10-CM | POA: Diagnosis present

## 2015-04-29 DIAGNOSIS — E039 Hypothyroidism, unspecified: Secondary | ICD-10-CM | POA: Diagnosis not present

## 2015-04-29 DIAGNOSIS — J449 Chronic obstructive pulmonary disease, unspecified: Secondary | ICD-10-CM | POA: Insufficient documentation

## 2015-04-29 DIAGNOSIS — N189 Chronic kidney disease, unspecified: Secondary | ICD-10-CM | POA: Diagnosis not present

## 2015-04-29 DIAGNOSIS — Z79899 Other long term (current) drug therapy: Secondary | ICD-10-CM | POA: Insufficient documentation

## 2015-04-29 LAB — URINE DRUG SCREEN, QUALITATIVE (ARMC ONLY)
Amphetamines, Ur Screen: NOT DETECTED
Barbiturates, Ur Screen: NOT DETECTED
Benzodiazepine, Ur Scrn: POSITIVE — AB
Cannabinoid 50 Ng, Ur ~~LOC~~: NOT DETECTED
Cocaine Metabolite,Ur ~~LOC~~: NOT DETECTED
MDMA (Ecstasy)Ur Screen: NOT DETECTED
Methadone Scn, Ur: NOT DETECTED
Opiate, Ur Screen: POSITIVE — AB
Phencyclidine (PCP) Ur S: NOT DETECTED
Tricyclic, Ur Screen: POSITIVE — AB

## 2015-04-29 LAB — BASIC METABOLIC PANEL
Anion gap: 16 — ABNORMAL HIGH (ref 5–15)
BUN: 10 mg/dL (ref 6–20)
CO2: 30 mmol/L (ref 22–32)
Calcium: 10.3 mg/dL (ref 8.9–10.3)
Chloride: 92 mmol/L — ABNORMAL LOW (ref 101–111)
Creatinine, Ser: 0.7 mg/dL (ref 0.44–1.00)
GFR calc Af Amer: >60 mL/min (ref >60)
GFR calc non Af Amer: >60 mL/min (ref >60)
Glucose, Bld: 138 mg/dL — ABNORMAL HIGH (ref 65–99)
Potassium: 4.4 mmol/L (ref 3.5–5.1)
Sodium: 138 mmol/L (ref 135–145)

## 2015-04-29 LAB — CBC WITH DIFFERENTIAL/PLATELET
Basophils Absolute: 0 10*3/uL (ref 0–0.1)
Basophils Relative: 1 %
Eosinophils Absolute: 0.1 10*3/uL (ref 0–0.7)
Eosinophils Relative: 1 %
HCT: 41.7 % (ref 35.0–47.0)
Hemoglobin: 14.5 g/dL (ref 12.0–16.0)
Lymphocytes Relative: 22 %
Lymphs Abs: 1.8 10*3/uL (ref 1.0–3.6)
MCH: 34.4 pg — ABNORMAL HIGH (ref 26.0–34.0)
MCHC: 34.8 g/dL (ref 32.0–36.0)
MCV: 98.8 fL (ref 80.0–100.0)
Monocytes Absolute: 0.5 10*3/uL (ref 0.2–0.9)
Monocytes Relative: 6 %
Neutro Abs: 5.8 10*3/uL (ref 1.4–6.5)
Neutrophils Relative %: 70 %
Platelets: 148 10*3/uL — ABNORMAL LOW (ref 150–440)
RBC: 4.22 MIL/uL (ref 3.80–5.20)
RDW: 12.6 % (ref 11.5–14.5)
WBC: 8.2 10*3/uL (ref 3.6–11.0)

## 2015-04-29 LAB — GLUCOSE, CAPILLARY: Glucose-Capillary: 139 mg/dL — ABNORMAL HIGH (ref 65–99)

## 2015-04-29 LAB — AMMONIA: Ammonia: 41 umol/L — ABNORMAL HIGH (ref 9–35)

## 2015-04-29 MED ORDER — NALOXONE HCL 0.4 MG/ML IJ SOLN
0.4000 mg | Freq: Once | INTRAMUSCULAR | Status: AC
  Administered 2015-04-29: 0.4 mg via INTRAVENOUS

## 2015-04-29 MED ORDER — NALOXONE HCL 2 MG/2ML IJ SOSY: PREFILLED_SYRINGE | INTRAMUSCULAR | Status: DC

## 2015-04-29 MED ORDER — FLUMAZENIL 0.5 MG/5ML IV SOLN
INTRAVENOUS | Status: AC
  Administered 2015-04-29: 0.2 mg via INTRAVENOUS
  Filled 2015-04-29: qty 5

## 2015-04-29 MED ORDER — NALOXONE HCL 2 MG/2ML IJ SOSY
PREFILLED_SYRINGE | INTRAMUSCULAR | Status: AC
  Filled 2015-04-29: qty 2

## 2015-04-29 MED ORDER — FLUMAZENIL 0.5 MG/5ML IV SOLN
0.2000 mg | Freq: Once | INTRAVENOUS | Status: AC
  Administered 2015-04-29: 0.2 mg via INTRAVENOUS

## 2015-04-29 NOTE — ED Notes (Signed)
MD to bedside at this time. Patient c/o head starting to feel funny. Pt is noted to be more alert at this time. Pt visibly uncomfortable at this time.

## 2015-04-29 NOTE — ED Notes (Signed)
Pt presents to ed with c/o not sleeping for days due to anxiety issues. Pt very sleepy in triage, states she took one 59m xanax and one 10-3254mpercocet. Pt can barely stay awake while triaging. Pt with slurred speech. Denies any pain at this time.

## 2015-04-29 NOTE — Discharge Instructions (Signed)
Accidental Overdose A drug overdose occurs when a chemical substance (drug or medication) is used in amounts large enough to overcome a person. This may result in severe illness or death. This is a type of poisoning. Accidental overdoses of medications or other substances come from a variety of reasons. When this happens accidentally, it is often because the person taking the substance does not know enough about what they have taken. Drugs which commonly cause overdose deaths are alcohol, psychotropic medications (medications which affect the mind), pain medications, illegal drugs (street drugs) such as cocaine and heroin, and multiple drugs taken at the same time. It may result from careless behavior (such as over-indulging at a party). Other causes of overdose may include multiple drug use, a lapse in memory, or drug use after a period of no drug use.  Sometimes overdosing occurs because a person cannot remember if they have taken their medication.  A common unintentional overdose in young children involves multi-vitamins containing iron. Iron is a part of the hemoglobin molecule in blood. It is used to transport oxygen to living cells. When taken in small amounts, iron allows the body to restock hemoglobin. In large amounts, it causes problems in the body. If this overdose is not treated, it can lead to death. Never take medicines that show signs of tampering or do not seem quite right. Never take medicines in the dark or in poor lighting. Read the label and check each dose of medicine before you take it. When adults are poisoned, it happens most often through carelessness or lack of information. Taking medicines in the dark or taking medicine prescribed for someone else to treat the same type of problem is a dangerous practice. SYMPTOMS  Symptoms of overdose depend on the medication and amount taken. They can vary from over-activity with stimulant over-dosage, to sleepiness from depressants such as  alcohol, narcotics and tranquilizers. Confusion, dizziness, nausea and vomiting may be present. If problems are severe enough coma and death may result. DIAGNOSIS  Diagnosis and management are generally straightforward if the drug is known. Otherwise it is more difficult. At times, certain symptoms and signs exhibited by the patient, or blood tests, can reveal the drug in question.  TREATMENT  In an emergency department, most patients can be treated with supportive measures. Antidotes may be available if there has been an overdose of opioids or benzodiazepines. A rapid improvement will often occur if this is the cause of overdose. At home or away from medical care:  There may be no immediate problems or warning signs in children.  Not everything works well in all cases of poisoning.  Take immediate action. Poisons may act quickly.  If you think someone has swallowed medicine or a household product, and the person is unconscious, having seizures (convulsions), or is not breathing, immediately call for an ambulance. IF a person is conscious and appears to be doing OK but has swallowed a poison:  Do not wait to see what effect the poison will have. Immediately call a poison control center (listed in the white pages of your telephone book under "Poison Control" or inside the front cover with other emergency numbers). Some poison control centers have TTY capability for the deaf. Check with your local center if you or someone in your family requires this service.  Keep the container so you can read the label on the product for ingredients.  Describe what, when, and how much was taken and the age and condition of the person poisoned.  Inform them if the person is vomiting, choking, drowsy, shows a change in color or temperature of skin, is conscious or unconscious, or is convulsing.  Do not cause vomiting unless instructed by medical personnel. Do not induce vomiting or force liquids into a person who  is convulsing, unconscious, or very drowsy. Stay calm and in control.   Activated charcoal also is sometimes used in certain types of poisoning and you may wish to add a supply to your emergency medicines. It is available without a prescription. Call a poison control center before using this medication. PREVENTION  Thousands of children die every year from unintentional poisoning. This may be from household chemicals, poisoning from carbon monoxide in a car, taking their parent's medications, or simply taking a few iron pills or vitamins with iron. Poisoning comes from unexpected sources.  Store medicines out of the sight and reach of children, preferably in a locked cabinet. Do not keep medications in a food cabinet. Always store your medicines in a secure place. Get rid of expired medications.  If you have children living with you or have them as occasional guests, you should have child-resistant caps on your medicine containers. Keep everything out of reach. Child proof your home.  If you are called to the telephone or to answer the door while you are taking a medicine, take the container with you or put the medicine out of the reach of small children.  Do not take your medication in front of children. Do not tell your child how good a medication is and how good it is for them. They may get the idea it is more of a treat.  If you are an adult and have accidentally taken an overdose, you need to consider how this happened and what can be done to prevent it from happening again. If this was from a street drug or alcohol, determine if there is a problem that needs addressing. If you are not sure a problems exists, it is easy to talk to a professional and ask them if they think you have a problem. It is better to handle this problem in this way before it happens again and has a much worse consequence.   This information is not intended to replace advice given to you by your health care provider. Make  sure you discuss any questions you have with your health care provider.   Document Released: 03/05/2004 Document Revised: 01/10/2014 Document Reviewed: 06/09/2014 Elsevier Interactive Patient Education [employer].

## 2015-04-29 NOTE — ED Notes (Signed)
MD notified of patient's multiple trips to the bathroom on her own and of patient's sudden lethargy and change in mental status. Pt noted to be sleepy again, awakens to verbal stimulation at this time.

## 2015-04-29 NOTE — ED Provider Notes (Signed)
[facility] Emergency Department Provider Note  L5 caveat: Review of systems and history is limited by her altered mental status      Time seen: ----------------------------------------- 3:12PM on 04/29/2015 -----------------------------------------    I have reviewed the triage vital signs and the nursing notes.   HISTORY  Chief Complaint Anxiety and Insomnia    HPI Lori Mckenzie is a 50 y.o. female who presents ER for not sleeping well for the past several days due to anxiety issues. Patient brought in very sleepy, states she took 2 mg Xanax and 10 mg Percocet prior to arrival.Patient had denied any pain prior to my evaluation, my evaluation she is complaining of headache and back pain.Patient is too somnolent to obtain further information.   Past Medical History  Diagnosis Date  . Brittle bone disease   . Asthma   . Thyroid disease   . Hypertension   . Back pain   . Diabetes mellitus without complication ([place])   . Chronic kidney disease   . Anxiety   . GERD (gastroesophageal reflux disease)   . Sleep apnea   . COPD (chronic obstructive pulmonary disease) ([place])   . Hypothyroidism   . Cervical disc disease   . TIA (transient ischemic attack)   . Cancer ([place])     Uteriine  ca 24yr ago partial hysterectomy  . Collagen vascular disease ([place]     RA  3-4 yrs ago  . Ascites   . NASH (nonalcoholic steatohepatitis)   . Sleep apnea   . Migraines   . Respiratory infection     2/17    Patient Active Problem List   Diagnosis Date Noted  . Radicular pain of thoracic region 04/06/2015  . Pain management 03/31/2015  . Hepatic encephalopathy ([place] 03/11/2015  . Elevated sedimentation rate 03/05/2015  . Elevated C-reactive protein (CRP) 03/05/2015  . Lumbar facet syndrome (Location of Primary Source of Pain) (Bilateral) (R>L) 03/05/2015  . Cervical spondylosis 03/05/2015  . Chronic feet pain (Location of Secondary source of pain) (Bilateral) (R>L)  03/05/2015  . Lumbar spondylosis 03/05/2015  . Encounter for chronic pain management 03/05/2015  . Chronic shoulder pain (Bilateral) (R>L) 03/05/2015  . Chronic carpal tunnel syndrome (Bilateral) 03/05/2015  . Chronic hip pain (Bilateral) (L>R) 03/05/2015  . Chronic upper back pain (Bilateral) (L>R) 03/05/2015  . Osteoporosis, idiopathic 03/05/2015  . Abnormal MRI, lumbar spine (02/03/2015) 03/05/2015  . Thoracic radiculitis 02/05/2015  . Vitamin D deficiency 01/13/2015  . B12 deficiency 01/13/2015  . Folate deficiency 01/13/2015  . Subacute lumbar radiculopathy (left side) (S1 dermatome) 12/10/2014  . Fall 11/16/2014  . Drowsiness 11/16/2014  . Episode of syncope 11/16/2014  . Chronic pain 10/28/2014  . Opiate use (75 MME/Day) 10/28/2014  . Long term prescription opiate use 10/28/2014  . Long term current use of opiate analgesic 10/28/2014  . Encounter for therapeutic drug level monitoring 10/28/2014  . Chronic epigastric abdominal pain 10/28/2014  . Chronic low back pain (Location of Primary Source of Pain) (Bilateral) (R>L) 10/28/2014  . Chronic neck pain (Location of Tertiary source of pain) (Bilateral) (R>L) 10/28/2014  . Sleep apnea 10/28/2014  . Pyrexia   . Chronic obstructive pulmonary disease ([place] 09/10/2014  . Ascites 09/05/2014  . NASH (nonalcoholic steatohepatitis) 09/05/2014  . Hypokalemia 09/05/2014  . Abdominal abscess ([place] 08/25/2014  . Dysthymia 08/05/2014  . Other social stressor 08/05/2014  . Bile leak, postoperative 07/17/2014  . Steatohepatitis 07/15/2014  . Type 2 diabetes mellitus ([place] 07/12/2014  . COPD (chronic  obstructive pulmonary disease) ([place]) 07/12/2014  . HTN (hypertension) 07/12/2014  . GERD (gastroesophageal reflux disease) 07/12/2014  . OSA on CPAP 07/12/2014  . Anxiety 07/12/2014  . 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 Lumbar and sacral osteoarthritis 03/26/2014  . Myofascial pain 03/26/2014  . Breath  shortness 01/09/2014  . Diastolic dysfunction 64/33/2951  . Airway hyperreactivity 08/06/2013  . Essential (primary) hypertension 08/06/2013  . Clinical depression 12/05/2011  . Major depressive disorder with single episode ([place]) 12/05/2011  . Anxiety state 08/10/2010  . Acid reflux 08/10/2010    Past Surgical History  Procedure Laterality Date  . Abdominal hysterectomy    . Tubal ligation    . Colonoscopy with propofol N/A 06/23/2014    Procedure: COLONOSCOPY WITH PROPOFOL;  Surgeon: Lollie Sails, MD;  Location: [facility] ENDOSCOPY;  Service: Endoscopy;  Laterality: N/A;  . Esophagogastroduodenoscopy N/A 06/23/2014    Procedure: ESOPHAGOGASTRODUODENOSCOPY (EGD);  Surgeon: Lollie Sails, MD;  [facility];  Service: Endoscopy;  Laterality: N/A;  . Cholecystectomy N/A 07/15/2014    Procedure: LAPAROSCOPIC CHOLECYSTECTOMY with liver biopsy ;  Surgeon: Sherri Rad, MD;  Location: [facility] ORS;  Service: General;  Laterality: N/A;  . Ercp N/A 07/16/2014    Procedure: ENDOSCOPIC RETROGRADE CHOLANGIOPANCREATOGRAPHY (ERCP);  Surgeon: Clarene Essex, MD;  Location: [facility] ENDOSCOPY;  Service: Endoscopy;  Laterality: N/A;  . Wisdom tooth extraction    . Ercp N/A 10/03/2014    Procedure: ENDOSCOPIC RETROGRADE CHOLANGIOPANCREATOGRAPHY (ERCP);  Surgeon: Hulen Luster, MD;  Location: [facility] ENDOSCOPY;  Service: Gastroenterology;  Laterality: N/A;    Allergies Tape and Vicodin  Social History Social History  Substance Use Topics  . Smoking status: Never Smoker   . Smokeless tobacco: Never Used  . Alcohol Use: No     Comment: occ    Review of Systems  Unknown at this time  ____________________________________________   PHYSICAL EXAM:  VITAL SIGNS: ED Triage Vitals  Enc Vitals Group     BP 04/29/15 1443 120/89 mmHg     Pulse Rate 04/29/15 1443 92     Resp 04/29/15 1443 20     Temp 04/29/15 1443 98.6 F (37 C)     Temp Source 04/29/15 1443 Oral     SpO2 04/29/15 1443 93 %     Weight --       Height --      Head Cir --      Peak Flow --      Pain Score --      Pain Loc --      Pain Edu? --      Excl. in [place]? --     Constitutional: Very drowsy, falls asleep while talking to Eyes: Conjunctivae are normal. Pupils 3 mm equal. Normal extraocular movements. ENT   Head: Normocephalic and atraumatic.   Nose: No congestion/rhinnorhea.   Mouth/Throat: Mucous membranes are moist.   Neck: No stridor. Cardiovascular: Normal rate, regular rhythm. No murmurs, rubs, or gallops. Respiratory: Normal respiratory effort without tachypnea nor retractions. Breath sounds are clear and equal bilaterally. No wheezes/rales/rhonchi. Gastrointestinal: Soft and nontender. Normal bowel sounds Musculoskeletal: Nontender with normal range of motion in all extremities. No lower extremity tenderness nor edema. Neurologic:  Slurred speech, No gross focal neurologic deficits are appreciated.  Skin:  Skin is warm, dry and intact. No rash noted. ____________________________________________  ED COURSE:  Pertinent labs & imaging results that were available during my care of the patient were reviewed by me and considered  in my medical decision making (see chart for details). Patient presents with altered mental status and is likely overmedicated or has overdosed. She will receive Narcan and flumazenil. ____________________________________________    LABS (pertinent positives/negatives)  Labs Reviewed  GLUCOSE, CAPILLARY - Abnormal; Notable for the following:    Glucose-Capillary 139 (*)    All other components within normal limits  URINE DRUG SCREEN, QUALITATIVE ([facility] ONLY) - Abnormal; Notable for the following:    Tricyclic, Ur Screen POSITIVE (*)    Opiate, Ur Screen POSITIVE (*)    Benzodiazepine, Ur Scrn POSITIVE (*)    All other components within normal limits  CBC WITH DIFFERENTIAL/PLATELET - Abnormal; Notable for the following:    MCH 34.4 (*)    Platelets 148 (*)    All other  components within normal limits  BASIC METABOLIC PANEL - Abnormal; Notable for the following:    Chloride 92 (*)    Glucose, Bld 138 (*)    Anion gap 16 (*)    All other components within normal limits  AMMONIA - Abnormal; Notable for the following:    Ammonia 41 (*)    All other components within normal limits  ____________________________________________  FINAL ASSESSMENT AND PLAN  Accidental overdose  Plan: Patient with labs as dictated above. Patient had subsequent improvement in her mental status after Narcan and flumazenil. After a period of observation these drugs wore off and she became somnolent again. She was redosed. Patient denies taking extra medication, also to note her husband was admitted for altered mental status and polysubstance ingestion and her son also appears intoxicated and admits to smoking marijuana prior to arrival. I advised her she must at least cut in half the sedatives and pain medicine she is taking.   Earleen Newport, MD   Note: This dictation was prepared with Dragon dictation. Any transcriptional errors that result from this process are unintentional   Earleen Newport, MD 04/29/15 1733

## 2015-04-29 NOTE — ED Notes (Signed)
Pt given Narcan and Romazicon, pt tolerated well. Pt noted to be more alert and it's easier to speak at this time.

## 2015-04-29 NOTE — ED Notes (Signed)
Pt ambulatory to the bathroom at this time. Patient's family at bedside at this time. Will continue to monitor.

## 2015-04-29 NOTE — ED Notes (Signed)
NAD noted at time of D/C. Pt noted to be more alert, states understanding of D/C instructions. Pt's sister at bedside for D/C instructions at this time. Pt denies comments/concerns. Respirations even and unlabored. MD notified of patient being off oxygen at this time, states ok for D/C.

## 2015-05-27 ENCOUNTER — Ambulatory Visit
Admission: RE | Admit: 2015-05-27 | Discharge: 2015-05-27 | Disposition: A | Discharge status: Home / Self Care | Payer: Medicaid Other | Source: Ambulatory Visit | Attending: Family Medicine | Admitting: Family Medicine

## 2015-05-27 ENCOUNTER — Other Ambulatory Visit: Payer: Self-pay | Admitting: Family Medicine

## 2015-05-27 DIAGNOSIS — R0602 Shortness of breath: Secondary | ICD-10-CM | POA: Diagnosis present

## 2015-05-27 DIAGNOSIS — R918 Other nonspecific abnormal finding of lung field: Secondary | ICD-10-CM | POA: Diagnosis not present

## 2015-06-24 ENCOUNTER — Other Ambulatory Visit: Payer: Self-pay

## 2015-06-24 ENCOUNTER — Emergency Department: Payer: Medicaid Other

## 2015-06-24 ENCOUNTER — Encounter: Payer: Self-pay | Admitting: Emergency Medicine

## 2015-06-24 ENCOUNTER — Emergency Department
Admission: EM | Admit: 2015-06-24 | Discharge: 2015-06-25 | Disposition: A | Discharge status: Home / Self Care | Payer: Medicaid Other | Attending: Emergency Medicine | Admitting: Emergency Medicine

## 2015-06-24 DIAGNOSIS — K7290 Hepatic failure, unspecified without coma: Secondary | ICD-10-CM | POA: Diagnosis not present

## 2015-06-24 DIAGNOSIS — J45909 Unspecified asthma, uncomplicated: Secondary | ICD-10-CM | POA: Diagnosis not present

## 2015-06-24 DIAGNOSIS — F329 Major depressive disorder, single episode, unspecified: Secondary | ICD-10-CM | POA: Diagnosis not present

## 2015-06-24 DIAGNOSIS — Z79899 Other long term (current) drug therapy: Secondary | ICD-10-CM | POA: Diagnosis not present

## 2015-06-24 DIAGNOSIS — K219 Gastro-esophageal reflux disease without esophagitis: Secondary | ICD-10-CM | POA: Diagnosis present

## 2015-06-24 DIAGNOSIS — I129 Hypertensive chronic kidney disease with stage 1 through stage 4 chronic kidney disease, or unspecified chronic kidney disease: Secondary | ICD-10-CM | POA: Insufficient documentation

## 2015-06-24 DIAGNOSIS — M818 Other osteoporosis without current pathological fracture: Secondary | ICD-10-CM | POA: Insufficient documentation

## 2015-06-24 DIAGNOSIS — R400 Somnolence: Secondary | ICD-10-CM

## 2015-06-24 DIAGNOSIS — E872 Acidosis: Secondary | ICD-10-CM | POA: Diagnosis not present

## 2015-06-24 DIAGNOSIS — Z7982 Long term (current) use of aspirin: Secondary | ICD-10-CM | POA: Insufficient documentation

## 2015-06-24 DIAGNOSIS — N189 Chronic kidney disease, unspecified: Secondary | ICD-10-CM | POA: Insufficient documentation

## 2015-06-24 DIAGNOSIS — J449 Chronic obstructive pulmonary disease, unspecified: Secondary | ICD-10-CM | POA: Diagnosis not present

## 2015-06-24 DIAGNOSIS — K7682 Hepatic encephalopathy: Secondary | ICD-10-CM

## 2015-06-24 DIAGNOSIS — E8729 Other acidosis: Secondary | ICD-10-CM

## 2015-06-24 DIAGNOSIS — Z8542 Personal history of malignant neoplasm of other parts of uterus: Secondary | ICD-10-CM | POA: Diagnosis not present

## 2015-06-24 LAB — CBC
HCT: 41.6 % (ref 35.0–47.0)
Hemoglobin: 14.7 g/dL (ref 12.0–16.0)
MCH: 34.9 pg — ABNORMAL HIGH (ref 26.0–34.0)
MCHC: 35.3 g/dL (ref 32.0–36.0)
MCV: 98.8 fL (ref 80.0–100.0)
Platelets: 104 10*3/uL — ABNORMAL LOW (ref 150–440)
RBC: 4.21 MIL/uL (ref 3.80–5.20)
RDW: 12.7 % (ref 11.5–14.5)
WBC: 6.4 10*3/uL (ref 3.6–11.0)

## 2015-06-24 LAB — HEPATIC FUNCTION PANEL
ALT: 67 U/L — ABNORMAL HIGH (ref 14–54)
AST: 46 U/L — ABNORMAL HIGH (ref 15–41)
Albumin: 4.5 g/dL (ref 3.5–5.0)
Alkaline Phosphatase: 89 U/L (ref 38–126)
Bilirubin, Direct: 0.2 mg/dL (ref 0.1–0.5)
Indirect Bilirubin: 0.6 mg/dL (ref 0.3–0.9)
Total Bilirubin: 0.8 mg/dL (ref 0.3–1.2)
Total Protein: 7.6 g/dL (ref 6.5–8.1)

## 2015-06-24 LAB — BASIC METABOLIC PANEL
Anion gap: 8 (ref 5–15)
BUN: 9 mg/dL (ref 6–20)
CO2: 26 mmol/L (ref 22–32)
Calcium: 9 mg/dL (ref 8.9–10.3)
Chloride: 103 mmol/L (ref 101–111)
Creatinine, Ser: 0.85 mg/dL (ref 0.44–1.00)
GFR calc Af Amer: >60 mL/min (ref >60)
GFR calc non Af Amer: >60 mL/min (ref >60)
Glucose, Bld: 176 mg/dL — ABNORMAL HIGH (ref 65–99)
Potassium: 4 mmol/L (ref 3.5–5.1)
Sodium: 137 mmol/L (ref 135–145)

## 2015-06-24 LAB — BLOOD GAS, VENOUS
Acid-Base Excess: 1.5 mmol/L (ref 0.0–3.0)
Bicarbonate: 30.5 mEq/L — ABNORMAL HIGH (ref 21.0–28.0)
O2 Saturation: 74.2 %
Patient temperature: 37
pCO2, Ven: 65 mmHg — ABNORMAL HIGH (ref 44.0–60.0)
pH, Ven: 7.28 — ABNORMAL LOW (ref 7.320–7.430)
pO2, Ven: 45 mmHg (ref 31.0–45.0)

## 2015-06-24 LAB — TROPONIN I: Troponin I: <0.03 ng/mL (ref <0.031)

## 2015-06-24 LAB — LACTIC ACID, PLASMA: Lactic Acid, Venous: 1.3 mmol/L (ref 0.5–2.0)

## 2015-06-24 LAB — LIPASE, BLOOD: Lipase: 23 U/L (ref 11–51)

## 2015-06-24 MED ORDER — PIPERACILLIN-TAZOBACTAM 3.375 G IVPB 30 MIN
3.3750 g | Freq: Once | INTRAVENOUS | Status: AC
  Administered 2015-06-24: 3.375 g via INTRAVENOUS
  Filled 2015-06-24 (×2): qty 50

## 2015-06-24 MED ORDER — DIATRIZOATE MEGLUMINE & SODIUM 66-10 % PO SOLN
15.0000 mL | Freq: Once | ORAL | Status: AC
  Administered 2015-06-24: 15 mL via ORAL

## 2015-06-24 MED ORDER — SODIUM CHLORIDE 0.9 % IV BOLUS (SEPSIS)
1000.0000 mL | Freq: Once | INTRAVENOUS | Status: AC
  Administered 2015-06-24: 1000 mL via INTRAVENOUS

## 2015-06-24 MED ORDER — IOPAMIDOL (ISOVUE-300) INJECTION 61%
100.0000 mL | Freq: Once | INTRAVENOUS | Status: AC | PRN
  Administered 2015-06-24: 100 mL via INTRAVENOUS
  Filled 2015-06-24: qty 100

## 2015-06-24 NOTE — ED Notes (Addendum)
Pt with GERD and reports fatigue, also states she is constipated. Pt also adds that her neck and jaw having been hurting.

## 2015-06-24 NOTE — ED Provider Notes (Signed)
[facility] Emergency Department Provider Note  ____________________________________________  Time seen: 9:45 PM  I have reviewed the triage vital signs and the nursing notes.   HISTORY  Chief Complaint Gastroesophageal Reflux and Fatigue  Level 5 caveat:  Portions of the history and physical were unable to be obtained due to the patient's acute illness and altered mental status   HPI Lori Mckenzie is a 50 y.o. female brought to the ED by family with confusion and altered mental status along with inability to urinate, decreased bowel movements, and generalized abdominal pain. Abdominal pain is described as severe, nonradiating. Patient is unable to provide further details. No vomiting.  Patient takes Lasix, spironolactone, rifaximin, and lactulose for her nonalcoholic steatohepatitis with cirrhosis. She's been continuing to receive these medications over the last couple days, but urine output has dropped to almost 0. Additionally, she has not had a bowel movement in about 3 days. She does have a history of a complicated cholecystectomy about a year ago resulted in a bile leak and biliary stenting. Patient has a history of hepatic encephalopathy.   Past Medical History  Diagnosis Date  . Brittle bone disease   . Asthma   . Thyroid disease   . Hypertension   . Back pain   . Diabetes mellitus without complication ([place])   . Chronic kidney disease   . Anxiety   . GERD (gastroesophageal reflux disease)   . Sleep apnea   . COPD (chronic obstructive pulmonary disease) ([place])   . Hypothyroidism   . Cervical disc disease   . TIA (transient ischemic attack)   . Cancer ([place])     Uteriine  ca 74yr ago partial hysterectomy  . Collagen vascular disease [place]     RA  3-4 yrs ago  . Ascites   . NASH (nonalcoholic steatohepatitis)   . Sleep apnea   . Migraines   . Respiratory infection     2/17     Patient Active Problem List   Diagnosis Date Noted  . Radicular  pain of thoracic region 04/06/2015  . Pain management 03/31/2015  . Hepatic encephalopathy ([place] 03/11/2015  . Elevated sedimentation rate 03/05/2015  . Elevated C-reactive protein (CRP) 03/05/2015  . Lumbar facet syndrome (Location of Primary Source of Pain) (Bilateral) (R>L) 03/05/2015  . Cervical spondylosis 03/05/2015  . Chronic feet pain (Location of Secondary source of pain) (Bilateral) (R>L) 03/05/2015  . Lumbar spondylosis 03/05/2015  . Encounter for chronic pain management 03/05/2015  . Chronic shoulder pain (Bilateral) (R>L) 03/05/2015  . Chronic carpal tunnel syndrome (Bilateral) 03/05/2015  . Chronic hip pain (Bilateral) (L>R) 03/05/2015  . Chronic upper back pain (Bilateral) (L>R) 03/05/2015  . Osteoporosis, idiopathic 03/05/2015  . Abnormal MRI, lumbar spine (02/03/2015) 03/05/2015  . Thoracic radiculitis 02/05/2015  . Vitamin D deficiency 01/13/2015  . B12 deficiency 01/13/2015  . Folate deficiency 01/13/2015  . Subacute lumbar radiculopathy (left side) (S1 dermatome) 12/10/2014  . Fall 11/16/2014  . Drowsiness 11/16/2014  . Episode of syncope 11/16/2014  . Chronic pain 10/28/2014  . Opiate use (75 MME/Day) 10/28/2014  . Long term prescription opiate use 10/28/2014  . Long term current use of opiate analgesic 10/28/2014  . Encounter for therapeutic drug level monitoring 10/28/2014  . Chronic epigastric abdominal pain 10/28/2014  . Chronic low back pain (Location of Primary Source of Pain) (Bilateral) (R>L) 10/28/2014  . Chronic neck pain (Location of Tertiary source of pain) (Bilateral) (R>L) 10/28/2014  . Sleep apnea 10/28/2014  . Pyrexia   .  Chronic obstructive pulmonary disease ([place]) 09/10/2014  . Ascites 09/05/2014  . NASH (nonalcoholic steatohepatitis) 09/05/2014  . Hypokalemia 09/05/2014  . Abdominal abscess ([place]) 08/25/2014  . Dysthymia 08/05/2014  . Other social stressor 08/05/2014  . Bile leak, postoperative 07/17/2014  . Steatohepatitis 07/15/2014   . Type 2 diabetes mellitus ([place]) 07/12/2014  . COPD (chronic obstructive pulmonary disease) ([place]) 07/12/2014  . HTN (hypertension) 07/12/2014  . GERD (gastroesophageal reflux disease) 07/12/2014  . OSA on CPAP 07/12/2014  . Anxiety 07/12/2014  . 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 Lumbar and sacral osteoarthritis 03/26/2014  . Myofascial pain 03/26/2014  . Breath shortness 01/09/2014  . Diastolic dysfunction 18/29/9371  . Airway hyperreactivity 08/06/2013  . Essential (primary) hypertension 08/06/2013  . Clinical depression 12/05/2011  . Major depressive disorder with single episode ([place]) 12/05/2011  . Anxiety state 08/10/2010  . Acid reflux 08/10/2010     Past Surgical History  Procedure Laterality Date  . Abdominal hysterectomy    . Tubal ligation    . Colonoscopy with propofol N/A 06/23/2014    Procedure: COLONOSCOPY WITH PROPOFOL;  Surgeon: Lollie Sails, MD;  Location: [facility] ENDOSCOPY;  Service: Endoscopy;  Laterality: N/A;  . Esophagogastroduodenoscopy N/A 06/23/2014    Procedure: ESOPHAGOGASTRODUODENOSCOPY (EGD);  Surgeon: Lollie Sails, MD;  Location: [facility] ENDOSCOPY;  Service: Endoscopy;  Laterality: N/A;  . Cholecystectomy N/A 07/15/2014    Procedure: LAPAROSCOPIC CHOLECYSTECTOMY with liver biopsy ;  Surgeon: Sherri Rad, MD;  Location: [facility] ORS;  Service: General;  Laterality: N/A;  . Ercp N/A 07/16/2014    Procedure: ENDOSCOPIC RETROGRADE CHOLANGIOPANCREATOGRAPHY (ERCP);  Surgeon: Clarene Essex, MD;  Location: [facility] ENDOSCOPY;  Service: Endoscopy;  Laterality: N/A;  . Wisdom tooth extraction    . Ercp N/A 10/03/2014    Procedure: ENDOSCOPIC RETROGRADE CHOLANGIOPANCREATOGRAPHY (ERCP);  Surgeon: Hulen Luster, MD;  Location: [facility] ENDOSCOPY;  Service: Gastroenterology;  Laterality: N/A;     Current Outpatient Rx  Name  Route  Sig  Dispense  Refill  . albuterol (PROVENTIL HFA;VENTOLIN HFA) 108 (90 BASE) MCG/ACT inhaler   Inhalation   Inhale 2  puffs into the lungs every 4 (four) hours as needed for wheezing or shortness of breath.         . ALPRAZolam (XANAX) 1 MG tablet   Oral   Take 1-2 mg by mouth 3 (three) times daily as needed for anxiety.         Marland Kitchen aspirin EC 81 MG tablet   Oral   Take 81 mg by mouth daily.          . citalopram (CELEXA) 20 MG tablet   Oral   Take 20 mg by mouth at bedtime.          . dicyclomine (BENTYL) 10 MG capsule   Oral   Take 10 mg by mouth 4 (four) times daily -  before meals and at bedtime.          . famotidine (PEPCID) 20 MG tablet   Oral   Take 20 mg by mouth daily.          . fluticasone (FLONASE) 50 MCG/ACT nasal spray   Each Nare   Place 1-2 sprays into both nostrils daily as needed for rhinitis.          Derald Macleod Factor (INTRINSI B12-FOLATE) 696-789-38 MCG-MCG-MG TABS   Oral   Take 1 tablet by mouth daily. Reported on 04/06/2015         .  furosemide (LASIX) 40 MG tablet   Oral   Take 40 mg by mouth daily.          Marland Kitchen gabapentin (NEURONTIN) 300 MG capsule   Oral   Take 900 mg by mouth 4 (four) times daily as needed (for pain).          Marland Kitchen glipiZIDE (GLUCOTROL) 5 MG tablet   Oral   Take 1 tablet (5 mg total) by mouth daily before breakfast.   30 tablet   0   . hydrochlorothiazide (HYDRODIURIL) 25 MG tablet   Oral   Take 25 mg by mouth at bedtime.          Marland Kitchen lactulose (CHRONULAC) 10 GM/15ML solution   Oral   Take 30 mLs (20 g total) by mouth 3 (three) times daily.   240 mL   0   . lactulose (CHRONULAC) 10 GM/15ML solution   Oral   Take by mouth.         . loperamide (IMODIUM) 2 MG capsule   Oral   Take 1 capsule (2 mg total) by mouth every 6 (six) hours as needed for diarrhea or loose stools.   30 capsule   0   . naloxone (NARCAN) 2 MG/2ML injection      To be used as needed for altered mental status   2 mL   1   . nystatin (MYCOSTATIN) powder   Topical   Apply 1 g topically 3 (three) times daily as needed (for  irritation).          Marland Kitchen omeprazole (PRILOSEC) 20 MG capsule   Oral   Take 20 mg by mouth 2 (two) times daily before a meal.         . ondansetron (ZOFRAN) 4 MG tablet   Oral   Take 1 tablet (4 mg total) by mouth every 6 (six) hours as needed for nausea.   20 tablet   0   . oxyCODONE-acetaminophen (PERCOCET) 10-325 MG tablet   Oral   Take 1 tablet by mouth 5 (five) times daily as needed for pain.   150 tablet   0     Do not place this medication, or any other prescri ...   . oxyCODONE-acetaminophen (PERCOCET) 10-325 MG tablet   Oral   Take 1 tablet by mouth 5 (five) times daily as needed for pain.   150 tablet   0     Do not place this medication, or any other prescri ...   . oxyCODONE-acetaminophen (PERCOCET) 10-325 MG tablet   Oral   Take 1 tablet by mouth 5 (five) times daily as needed for pain. Do not take for 11 hours after taking Xanax. Max.: 5/day.   150 tablet   0     Do not place this medication, or any other prescri ...   . promethazine (PHENERGAN) 12.5 MG tablet   Oral   Take 12.5 mg by mouth every 6 (six) hours as needed for nausea or vomiting.          . rifaximin (XIFAXAN) 550 MG TABS tablet   Oral   Take 550 mg by mouth 2 (two) times daily.          . rizatriptan (MAXALT) 10 MG tablet   Oral   Take 10 mg by mouth as needed for migraine.          Marland Kitchen spironolactone (ALDACTONE) 100 MG tablet   Oral   Take 100 mg by mouth daily.          Marland Kitchen  vitamin B-12 (CYANOCOBALAMIN) 1000 MCG tablet   Oral   Take 2,000 mcg by mouth daily.            Allergies Tape and Vicodin   Family History  Problem Relation Age of Onset  . Lung cancer Father   . Ulcers Father   . Heart disease Sister   . Ulcers Sister   . Heart disease Brother     Social History Social History  Substance Use Topics  . Smoking status: Never Smoker   . Smokeless tobacco: Never Used  . Alcohol Use: No     Comment: occ    Review of Systems  Constitutional:   No  fever or chills.   Cardiovascular:   No chest pain. Respiratory:   No dyspnea or cough. Gastrointestinal:   Positive abdominal pain with constipation. No vomiting.  Genitourinary:   Decreased urine output. Neurological:   Increased confusion 10-point ROS otherwise negative.  ____________________________________________   PHYSICAL EXAM:  VITAL SIGNS: ED Triage Vitals  Enc Vitals Group     BP 06/24/15 1843 125/77 mmHg     Pulse Rate 06/24/15 1843 89     Resp 06/24/15 1843 20     Temp 06/24/15 1843 98.2 F (36.8 C)     Temp Source 06/24/15 1843 Oral     SpO2 06/24/15 1843 95 %     Weight --      Height --      Head Cir --      Peak Flow --      Pain Score 06/24/15 1840 9     Pain Loc --      Pain Edu? --      Excl. in [place]? --     Vital signs reviewed, nursing assessments reviewed.   Constitutional:   Somnolent, arousable but disoriented. Ill-appearing. Eyes:   No scleral icterus. No conjunctival pallor. PERRL. EOMI.  No nystagmus. ENT   Head:   Normocephalic and atraumatic.   Nose:   No congestion/rhinnorhea. No septal hematoma   Mouth/Throat:   Dry mucous membranes, no pharyngeal erythema. No peritonsillar mass.    Neck:   No stridor. No SubQ emphysema. No meningismus. Hematological/Lymphatic/Immunilogical:   No cervical lymphadenopathy. Cardiovascular:   RRR. Symmetric bilateral radial and DP pulses.  No murmurs.  Respiratory:   Normal respiratory effort without tachypnea nor retractions. Breath sounds are clear and equal bilaterally. No wheezes/rales/rhonchi. Gastrointestinal:   Soft , distended, generalized tenderness with rebound. There is no CVA tenderness.  No rebound, rigidity, or guarding. Genitourinary:   deferred Musculoskeletal:   Nontender with normal range of motion in all extremities. No joint effusions.  No lower extremity tenderness.  No edema. Neurologic:   Somnolent. Mumbling speech..  CN 2-10 normal. Motor grossly intact. No gross  focal neurologic deficits are appreciated.  Skin:    Skin is warm, dry and intact. No rash noted.  No petechiae, purpura, or bullae.  ____________________________________________    LABS (pertinent positives/negatives) (all labs ordered are listed, but only abnormal results are displayed) Labs Reviewed  BASIC METABOLIC PANEL - Abnormal; Notable for the following:    Glucose, Bld 176 (*)    All other components within normal limits  CBC - Abnormal; Notable for the following:    MCH 34.9 (*)    Platelets 104 (*)    All other components within normal limits  HEPATIC FUNCTION PANEL - Abnormal; Notable for the following:    AST 46 (*)    ALT 67 (*)  All other components within normal limits  BLOOD GAS, VENOUS - Abnormal; Notable for the following:    pH, Ven 7.28 (*)    pCO2, Ven 65 (*)    Bicarbonate 30.5 (*)    All other components within normal limits  CULTURE, BLOOD (ROUTINE X 2)  CULTURE, BLOOD (ROUTINE X 2)  TROPONIN I  LIPASE, BLOOD  LACTIC ACID, PLASMA  LACTIC ACID, PLASMA  AMMONIA   ____________________________________________   EKG  Interpreted by me Normal sinus rhythm rate of 82. Normal axis, normal intervals. Normal QRS ST segments and T waves. There are voltage criteria for LVH in the high lateral leads  ____________________________________________    RADIOLOGY  Chest x-ray unremarkable  ____________________________________________   PROCEDURES   ____________________________________________   INITIAL IMPRESSION / ASSESSMENT AND PLAN / ED COURSE  Pertinent labs & imaging results that were available during my care of the patient were reviewed by me and considered in my medical decision making (see chart for details).  Patient presents with altered mental status, generalized abdominal pain and constipation. In the setting of her previous complicated surgeries and her liver disease, this concerning for possible bowel obstruction versus spontaneous  bacterial peritonitis which is then resulting in recurrent hepatic encephalopathy. Possible that she could have cholangitis but I have a low suspicion of this at this point. We will start IV Zosyn plus IV saline while getting additional labs including cultures lactate ammonia and VBG. Plan for admission.  On exam there is not a significant amount of free ascites to allow for diagnostic paracentesis.  Care of patient signed out to oncoming physician at 11 PM to follow up on CT abdomen, lactate and ammonia. Likely admission.     ____________________________________________   FINAL CLINICAL IMPRESSION(S) / ED DIAGNOSES  Final diagnoses:  Somnolence  Hepatic encephalopathy ([place])  Respiratory acidosis       Portions of this note were generated with dragon dictation software. Dictation errors may occur despite best attempts at proofreading.   Carrie Mew, MD 06/24/15 [phone]

## 2015-06-25 LAB — URINALYSIS COMPLETE WITH MICROSCOPIC (ARMC ONLY)
Bilirubin Urine: NEGATIVE
Glucose, UA: NEGATIVE mg/dL
Hgb urine dipstick: NEGATIVE
Ketones, ur: NEGATIVE mg/dL
Leukocytes, UA: NEGATIVE
Nitrite: NEGATIVE
Protein, ur: NEGATIVE mg/dL
Specific Gravity, Urine: 1.014 (ref 1.005–1.030)
WBC, UA: NONE SEEN WBC/hpf (ref 0–5)
pH: 5 (ref 5.0–8.0)

## 2015-06-25 LAB — AMMONIA: Ammonia: 54 umol/L — ABNORMAL HIGH (ref 9–35)

## 2015-06-25 MED ORDER — LACTULOSE 10 GM/15ML PO SOLN
10.0000 g | Freq: Once | ORAL | Status: AC
  Administered 2015-06-25: 10 g via ORAL
  Filled 2015-06-25: qty 30

## 2015-06-25 MED ORDER — DIPHENHYDRAMINE HCL 50 MG/ML IJ SOLN
25.0000 mg | Freq: Once | INTRAMUSCULAR | Status: AC
  Administered 2015-06-25: 25 mg via INTRAVENOUS
  Filled 2015-06-25: qty 1

## 2015-06-25 MED ORDER — SODIUM CHLORIDE 0.9 % IV BOLUS (SEPSIS)
1000.0000 mL | Freq: Once | INTRAVENOUS | Status: AC
  Administered 2015-06-25: 1000 mL via INTRAVENOUS

## 2015-06-25 MED ORDER — METOCLOPRAMIDE HCL 5 MG/ML IJ SOLN
10.0000 mg | Freq: Once | INTRAMUSCULAR | Status: AC
  Administered 2015-06-25: 10 mg via INTRAVENOUS
  Filled 2015-06-25: qty 2

## 2015-06-25 NOTE — ED Notes (Signed)
Upon being told by hospitalist that patient was going to be discharged from home,patient removed her own IV, got dressed and left with husband. This nurse saw them walking down hallway towards lobby and asked patient to wait. Patient and husband refused saying "they were done".

## 2015-06-25 NOTE — Discharge Instructions (Signed)
Hepatic Encephalopathy Hepatic encephalopathy is a loss of brain function from advanced liver disease. The effects of the condition depend on the type of liver damage and how severe it is. In some cases, hepatic encephalopathy can be reversed. CAUSES The exact cause of hepatic encephalopathy is not known. RISK FACTORS You have a higher risk of getting this condition if your liver is damaged. When the liver is damaged harmful substances called toxins can build up in the body. Certain toxins, such as ammonia, can harm your brain. Conditions that can cause liver damage include:  An infection.  Dehydration.  Intestinal bleeding.  Drinking too much alcohol.  Taking certain medicines, including tranquilizers, water pills (diuretics), antidepressants, or sleeping pills. SIGNS AND SYMPTOMS Signs and symptoms may develop suddenly. Or, they may develop slowly and get worse gradually. Symptoms can range from mild to severe. Mild Hepatic Encephalopathy  Mild confusion.  Personality and mood changes.  Anxiety and agitation.  Drowsiness.  Loss of mental abilities.  Musty or sweet-smelling breath. Worsening or Severe Hepatic Encephalopathy  Slowed movement.  Slurred speech.  Extreme personality changes.  Disorientation.  Abnormal shaking or flapping of the hands.  Coma. DIAGNOSIS To make a diagnosis, your health care provider will do a physical exam. To rule out other causes of your signs and symptoms, he or she may order tests. You may have:  Blood tests. These may be done to check your ammonia level, measure how long it takes your blood to clot, and check for infection.  Liver function tests. These may be done to check how well your liver is working.  MRI and CT scans. These may be done to check for a brain disorder.  Electroencephalogram (EEG). This may be done to measure the electrical activity in your brain. TREATMENT The first step in treatment is identifying and  treating possible triggers. The next step is involves taking medicine to lower the level of toxins in the body and to prevent ammonia from building up. You may need to take:  Antibiotics to reduce the ammonia-producing bacteria in your gut.  Lactulose to help flush ammonia from the gut. HOME CARE INSTRUCTIONS Eating and Drinking  Follow a low-protein diet that includes plenty of fruits, vegetables, and whole grains, as directed by your health care provider. Ammonia is produced when you digest high-protein foods.  Work with a Microbiologist or with your health care provider to make sure you are getting the right balance of protein and minerals.  Drink enough fluids to keep your urine clear or pale yellow. Drinking plenty of water helps prevent constipation.  Do not drink alcohol or use illegal drugs. Medicines  Only take medicine as directed by your health care provider.  If you were prescribed an antibiotic medicine, finish it all even if you start to feel better.  Do not start any new medicines, including over-the-counter medicines, without first checking with your health care provider. SEEK MEDICAL CARE IF:  You have new symptoms.  Your symptoms change.  Your symptoms get worse.  You have a fever.  You are constipated.  You have persistent nausea, vomiting, or diarrhea. SEEK IMMEDIATE MEDICAL CARE IF:  You become very confused or drowsy.  You vomit blood or material that looks like coffee grounds.  Your stool is bloody or black or looks like tar.   This information is not intended to replace advice given to you by your health care provider. Make sure you discuss any questions you have with your health care provider.  Document Released: 03/01/2006 Document Revised: 01/10/2014 Document Reviewed: 08/07/2013 Elsevier Interactive Patient Education 2016 Else[employer] Inc[employer]  Metabolic Acidosis Metabolic acidosis is a condition in which there is too much acid in the blood. It  happens because of a chemical imbalance in your cells. Metabolic acidosis can happen at any age, and there are many different causes. It may be a symptom of a sudden, short-lived (acute) condition, or of a lifelong (chronic) condition. Metabolic acidosis can be mild, or it can be severe and life-threatening, depending on the cause. Metabolic acidosis can be corrected if the cause is identified and properly treated. CAUSES There are many possible causes of metabolic acidosis. The most common causes are:  The body producing too much acid.  Losing too much of a chemical that balances acid levels (bicarbonate). This can result from diarrhea or from certain surgeries on the stomach and intestines.  Excessive buildup of acidic substances (ketone bodies) in the blood due to untreated type 1 diabetes.  Excessive buildup of a chemical (lactic acid) that forms when the body is low on oxygen. Lactic acid buildup can result from:  Decreased flow of blood, oxygen, and nutrients to vital organs (shock).  Intense physical activity.  Disease.  Infection.  Exposure to a poisonous substance (toxin), such as:  Carbon monoxide.  Cyanide.  Antifreeze (ethylene glycol).  Methanol.  Certain medicines, such as acetazolamide or large doses of aspirin.  Kidney disease or kidney infection.  Too much iron in the body.  Excessive alcohol use.  Lack of healthy red blood cells (anemia). SYMPTOMS Symptoms of this condition vary depending on the cause and severity. Common symptoms include:  Rapid breathing.  Difficulty breathing.  Nausea or vomiting.  Headache.  Confusion.  Weakness.  Feeling restless, drowsy, and emotionless (lethargy). Mild acidosis may not cause any symptoms. Very severe acidosis can result in shock, coma, or death. DIAGNOSIS This condition is diagnosed based on a physical exam and your medical history. You may have tests, such as:  Blood tests. These may include:  An  arterial blood gas (ABG) test or a venous blood gas (VBG) test. These tests measure the acidity levels (pH balance) of your blood.  A serum electrolytes test. This test measures the amounts of minerals in your blood.  Urine tests. TREATMENT Treatment for metabolic acidosis depends on the underlying condition and the severity of symptoms. The goal of treatment is to balance the amount of acid in the body and to treat the underlying cause of metabolic acidosis. Mild metabolic acidosis can be treated by fluids given through an IV tube. Severe forms of metabolic acidosis require hospitalization and medicines to help treat the underlying problem. Bicarbonates may be needed if your condition is very severe. HOME CARE INSTRUCTIONS  Take over-the-counter and prescription medicines only as told by your health care provider.  If you were prescribed an antibiotic medicine, take it as told by your health care provider. Do not stop taking the antibiotic even if you start to feel better.  Drink enough fluid to keep your urine clear or pale yellow.  Follow instructions from your health care provider about eating or drinking restrictions.  Pay attention to your symptoms and any changes in your symptoms.  Keep all follow-up visits as told by your health care provider. This is important. PREVENTION Take these actions to prevent metabolic acidosis:  Manage any chronic conditions you have.  Avoid exposure to toxins.  Avoid drinking excessive amounts of alcohol.  Understand your medicines and any  supplements you take, and their possible side effects.  Watch for any symptoms of metabolic acidosis to develop. SEEK MEDICAL CARE IF:  You have any new symptoms.  You have side effects from medicines you are taking.  You have nausea, vomiting, or diarrhea that does not get better with medicine or that gets worse.  You have body aches or lethargy that gets worse.  You have dark urine or you urinate less  often than you usually do.  You have a decreased appetite.  You have a fever. SEEK IMMEDIATE MEDICAL CARE IF:  You have shortness of breath, chest pain, or a fast heartbeat (palpitations).  You feel like you are losing consciousness, or you faint.  You feel drowsy or confused.  You have severe pain in your joints and limbs.  You have severe abdominal pain. These symptoms may represent a serious problem that is an emergency. Do not wait to see if the symptoms will go away. Get medical help right away. Call your local emergency services (911 in the [place] Do not drive yourself to the hospital.   This information is not intended to replace advice given to you by your health care provider. Make sure you discuss any questions you have with your health care provider.   Document Released: 04/06/2010 Document Revised: 09/10/2014 Document Reviewed: 05/07/2014 Elsevier Interactive Patient Education [employer].

## 2015-06-25 NOTE — ED Notes (Signed)
Patient tells this RN that she uses laculose at home and has BM daily. States she had a BM today.

## 2015-06-25 NOTE — ED Provider Notes (Addendum)
-----------------------------------------   2:27 AM on 06/25/2015 -----------------------------------------   Blood pressure 99/68, pulse 86, temperature 98.2 F (36.8 C), temperature source Oral, resp. rate 20, SpO2 92 %.  Assuming care from Dr. Joni Fears.  In short, Lori Mckenzie is a 50 y.o. female with a chief complaint of Gastroesophageal Reflux and Fatigue .  Refer to the original H&P for additional details.  The current plan of care is to follow up the results of the ammonia and the CT and admit the patient for hepatic encephalopathy.   CT abdomen and pelvis: No acute abdominal process, stable post op changes  Ct head: Negative for bleed or other acute intracranial process  The patient's urinalysis was negative, lactic acidosis 1.3 but the patient's ammonia was 54. The patient will be admitted to the hospitalist service for hepatic encephalopathy as well as respiratory acidosis.  The patient was seen by the hospitalist who did not feel that the patient was confused or altered. She discussed the patient's condition with her husband who felt the patient was at her neurologic baseline. She feels that the patient does not need to be admitted as she is not encephalopathic. The patient reports that she has been having bowel movements every day but reports that she has not been urinating. She also reports that her blood sugars have been elevated. The patient has urinated 5 times while here and she has unremarkable blood sugars. The patient will be discharged to home.   Loney Hering, MD 06/25/15 [id number]  Loney Hering, MD 06/25/15 [phone]

## 2015-06-29 ENCOUNTER — Encounter: Payer: Self-pay | Admitting: Pain Medicine

## 2015-06-29 ENCOUNTER — Ambulatory Visit: Payer: Medicaid Other | Attending: Pain Medicine | Admitting: Pain Medicine

## 2015-06-29 VITALS — BP 117/74 | HR 74 | Resp 18 | Ht 62.0 in | Wt 250.0 lb

## 2015-06-29 DIAGNOSIS — K219 Gastro-esophageal reflux disease without esophagitis: Secondary | ICD-10-CM | POA: Diagnosis not present

## 2015-06-29 DIAGNOSIS — E876 Hypokalemia: Secondary | ICD-10-CM | POA: Diagnosis not present

## 2015-06-29 DIAGNOSIS — M4806 Spinal stenosis, lumbar region: Secondary | ICD-10-CM | POA: Insufficient documentation

## 2015-06-29 DIAGNOSIS — M545 Low back pain, unspecified: Secondary | ICD-10-CM

## 2015-06-29 DIAGNOSIS — E538 Deficiency of other specified B group vitamins: Secondary | ICD-10-CM | POA: Diagnosis not present

## 2015-06-29 DIAGNOSIS — M549 Dorsalgia, unspecified: Secondary | ICD-10-CM | POA: Diagnosis present

## 2015-06-29 DIAGNOSIS — Z79891 Long term (current) use of opiate analgesic: Secondary | ICD-10-CM | POA: Diagnosis not present

## 2015-06-29 DIAGNOSIS — K7290 Hepatic failure, unspecified without coma: Secondary | ICD-10-CM | POA: Diagnosis not present

## 2015-06-29 DIAGNOSIS — M5134 Other intervertebral disc degeneration, thoracic region: Secondary | ICD-10-CM | POA: Diagnosis not present

## 2015-06-29 DIAGNOSIS — Z8542 Personal history of malignant neoplasm of other parts of uterus: Secondary | ICD-10-CM | POA: Insufficient documentation

## 2015-06-29 DIAGNOSIS — D1809 Hemangioma of other sites: Secondary | ICD-10-CM | POA: Diagnosis not present

## 2015-06-29 DIAGNOSIS — G5603 Carpal tunnel syndrome, bilateral upper limbs: Secondary | ICD-10-CM | POA: Insufficient documentation

## 2015-06-29 DIAGNOSIS — M25559 Pain in unspecified hip: Secondary | ICD-10-CM | POA: Insufficient documentation

## 2015-06-29 DIAGNOSIS — M25511 Pain in right shoulder: Secondary | ICD-10-CM | POA: Insufficient documentation

## 2015-06-29 DIAGNOSIS — I10 Essential (primary) hypertension: Secondary | ICD-10-CM | POA: Insufficient documentation

## 2015-06-29 DIAGNOSIS — K7581 Nonalcoholic steatohepatitis (NASH): Secondary | ICD-10-CM | POA: Diagnosis not present

## 2015-06-29 DIAGNOSIS — M47814 Spondylosis without myelopathy or radiculopathy, thoracic region: Secondary | ICD-10-CM | POA: Diagnosis not present

## 2015-06-29 DIAGNOSIS — G8929 Other chronic pain: Secondary | ICD-10-CM | POA: Insufficient documentation

## 2015-06-29 DIAGNOSIS — M810 Age-related osteoporosis without current pathological fracture: Secondary | ICD-10-CM | POA: Insufficient documentation

## 2015-06-29 DIAGNOSIS — M47816 Spondylosis without myelopathy or radiculopathy, lumbar region: Secondary | ICD-10-CM

## 2015-06-29 DIAGNOSIS — M542 Cervicalgia: Secondary | ICD-10-CM | POA: Diagnosis not present

## 2015-06-29 DIAGNOSIS — J449 Chronic obstructive pulmonary disease, unspecified: Secondary | ICD-10-CM | POA: Diagnosis not present

## 2015-06-29 DIAGNOSIS — F329 Major depressive disorder, single episode, unspecified: Secondary | ICD-10-CM | POA: Insufficient documentation

## 2015-06-29 DIAGNOSIS — Z5181 Encounter for therapeutic drug level monitoring: Secondary | ICD-10-CM

## 2015-06-29 DIAGNOSIS — L02818 Cutaneous abscess of other sites: Secondary | ICD-10-CM | POA: Diagnosis not present

## 2015-06-29 DIAGNOSIS — R55 Syncope and collapse: Secondary | ICD-10-CM | POA: Insufficient documentation

## 2015-06-29 DIAGNOSIS — F419 Anxiety disorder, unspecified: Secondary | ICD-10-CM | POA: Diagnosis not present

## 2015-06-29 DIAGNOSIS — E119 Type 2 diabetes mellitus without complications: Secondary | ICD-10-CM | POA: Diagnosis not present

## 2015-06-29 DIAGNOSIS — R700 Elevated erythrocyte sedimentation rate: Secondary | ICD-10-CM | POA: Insufficient documentation

## 2015-06-29 DIAGNOSIS — R7982 Elevated C-reactive protein (CRP): Secondary | ICD-10-CM | POA: Insufficient documentation

## 2015-06-29 DIAGNOSIS — R188 Other ascites: Secondary | ICD-10-CM | POA: Insufficient documentation

## 2015-06-29 DIAGNOSIS — M5124 Other intervertebral disc displacement, thoracic region: Secondary | ICD-10-CM | POA: Diagnosis not present

## 2015-06-29 DIAGNOSIS — Z9049 Acquired absence of other specified parts of digestive tract: Secondary | ICD-10-CM | POA: Diagnosis not present

## 2015-06-29 DIAGNOSIS — M5410 Radiculopathy, site unspecified: Secondary | ICD-10-CM

## 2015-06-29 DIAGNOSIS — M5414 Radiculopathy, thoracic region: Secondary | ICD-10-CM | POA: Diagnosis not present

## 2015-06-29 DIAGNOSIS — R1013 Epigastric pain: Secondary | ICD-10-CM | POA: Insufficient documentation

## 2015-06-29 DIAGNOSIS — Z6841 Body Mass Index (BMI) 40.0 and over, adult: Secondary | ICD-10-CM | POA: Diagnosis not present

## 2015-06-29 DIAGNOSIS — M25512 Pain in left shoulder: Secondary | ICD-10-CM | POA: Diagnosis not present

## 2015-06-29 DIAGNOSIS — M48061 Spinal stenosis, lumbar region without neurogenic claudication: Secondary | ICD-10-CM

## 2015-06-29 DIAGNOSIS — M47896 Other spondylosis, lumbar region: Secondary | ICD-10-CM | POA: Diagnosis not present

## 2015-06-29 MED ORDER — OXYCODONE-ACETAMINOPHEN 10-325 MG PO TABS: 1.0000 | ORAL_TABLET | Freq: Every day | ORAL | Status: DC

## 2015-06-29 NOTE — Progress Notes (Signed)
Patient's Name: Lori Mckenzie  Patient type: Established  MRN: [id number]  Service setting: Ambulatory outpatient  DOB: March 31, 1965  Location: [facility]  DOS: 06/29/2015  Primary Care Physician: Ardine Eng, MD  Note by: Kathlen Brunswick. Dossie Arbour, M.D, DABA, DABAPM, DABPM, DABIPP, FIPP  Referring Physician: Ardine Eng, MD  Specialty: Board-Certified Interventional Pain Management  Last Visit to Pain Management: 04/06/2015   Primary Reason(s) for Visit: Encounter for prescription drug management (Level of risk: moderate) CC: Back Pain   HPI  Lori Mckenzie is a 50 y.o. year old, female patient, who returns today as an established patient. She has Type 2 diabetes mellitus ([place]); COPD (chronic obstructive pulmonary disease) (HCC); HTN (hypertension); GERD (gastroesophageal reflux disease); OSA on CPAP; Anxiety; Steatohepatitis; Bile leak, postoperative; Dysthymia; Other social stressor; Abdominal abscess ([place]); Ascites; NASH (nonalcoholic steatohepatitis); Hypokalemia; Pyrexia; Chronic pain; Opiate use (75 MME/Day); Long term prescription opiate use; Long term current use of opiate analgesic; Encounter for therapeutic drug level monitoring; Chronic epigastric abdominal pain; Chronic low back pain (Location of Primary Source of Pain) (Bilateral) (R>L); Chronic neck pain (Location of Tertiary source of pain) (Bilateral) (R>L); Breath shortness; Diastolic dysfunction; Clinical depression; Chronic obstructive pulmonary disease ([place]); Airway hyperreactivity; Anxiety state; Acid reflux; Essential (primary) hypertension; 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, L1-2, & L4-5); Lumbar and sacral osteoarthritis; Myofascial pain; Sleep apnea; Fall; Drowsiness; Episode of syncope; Subacute lumbar radiculopathy (left side) (S1 dermatome); Vitamin D deficiency; B12 deficiency; Folate deficiency; Thoracic radiculitis; Major depressive disorder with single episode ([place]); Elevated sedimentation rate;  Elevated C-reactive protein (CRP); Lumbar facet syndrome (Location of Primary Source of Pain) (Bilateral) (R>L); Cervical spondylosis; Chronic feet pain (Location of Secondary source of pain) (Bilateral) (R>L); Lumbar spondylosis; Encounter for chronic pain management; Chronic shoulder pain (Bilateral) (R>L); Chronic carpal tunnel syndrome (Bilateral); Chronic hip pain (Bilateral) (L>R); Chronic upper back pain (Bilateral) (L>R); Osteoporosis, idiopathic; Abnormal MRI, lumbar spine (02/03/2015); Hepatic encephalopathy ([place]); Pain management; and Radicular pain of thoracic region on her problem list.. Her primarily concern today is the Back Pain   Pain Assessment: Self-Reported Pain Score: 7 , clinically she looks like a 2/10. Reported level is inconsistent with clinical obrservations Information on the proper use of the pain score provided to the patient today. Pain Type: Chronic pain Pain Location: Back Pain Orientation: Lower, Upper, Mid Pain Descriptors / Indicators: Pressure Pain Frequency: Constant  The patient comes into the clinics today for pharmacological management of her chronic pain. I last saw this patient on 04/06/2015. The patient  reports that she does not use illicit drugs. Her body mass index is 45.71 kg/(m^2).  Date of Last Visit: 04/06/15 Service Provided on Last Visit: Med Refill  Controlled Substance Pharmacotherapy Assessment & REMS (Risk Evaluation and Mitigation Strategy)  Analgesic: Oxycodone/APAP 10/325 5 tablets per day (50 mg/day) MME/day: 75 mg/day.   Pill Count: Patient is sick on stomach and states she forgot to bring pills for count. Reminded to bring to all appointments. Pharmacokinetics: Onset of action (Liberation/Absorption): Within expected pharmacological parameters Time to Peak effect (Distribution): Timing and results are as within normal expected parameters Duration of action (Metabolism/Excretion): Within normal limits for  medication Pharmacodynamics: Analgesic Effect: More than 50% Activity Facilitation: Medication(s) allow patient to sit, stand, walk, and do the basic ADLs Perceived Effectiveness: Described as relatively effective, allowing for increase in activities of daily living (ADL) Side-effects or Adverse reactions: None reported Monitoring: [place] PMP: Online review of the past 54-monthperiod conducted. Compliant with practice rules  and regulations Last UDS on record: TOXASSURE SELECT 13  Date Value Ref Range Status  03/05/2015 FINAL  Final    Comment:    ==================================================================== TOXASSURE SELECT 13 (MW) ==================================================================== Test                             Result       Flag       Units Drug Present and Declared for Prescription Verification   Alprazolam                     735          EXPECTED   ng/mg creat   Alpha-hydroxyalprazolam        1088         EXPECTED   ng/mg creat    Source of alprazolam is a scheduled prescription medication.    Alpha-hydroxyalprazolam is an expected metabolite of alprazolam.   Oxycodone                      4063         EXPECTED   ng/mg creat   Noroxycodone                   6686         EXPECTED   ng/mg creat    Sources of oxycodone include scheduled prescription medications.    Noroxycodone is an expected metabolite of oxycodone. ==================================================================== Test                      Result    Flag   Units      Ref Range   Creatinine              51               mg/dL      >=20 ==================================================================== Declared Medications:  The flagging and interpretation on this report are based on the  following declared medications.  Unexpected results may arise from  inaccuracies in the declared medications.  **Note: The testing scope of this panel includes these medications:  Alprazolam (Xanax)   Oxycodone (Percocet)  **Note: The testing scope of this panel does not include following  reported medications:  Acetaminophen (Percocet)  Albuterol (Proventil)  Aspirin (Aspirin 81)  Citalopram (Celexa)  Cyanocobalamin  Dicyclomine (Bentyl)  Famotidine (Pepcid)  Fluticasone (Flonase)  Furosemide (Lasix)  Gabapentin (Neurontin)  Hydrochlorothiazide (Hydrodiuril)  Lactulose  Loperamide (Imodium)  Metformin (Glucophage)  Nystatin (Mycostatin)  Omeprazole (Prilosec)  Ondansetron (Zofran)  Promethazine (Phenergan)  Rifaximin (Xifaxan)  Rizatriptan (Maxalt)  Spironolactone (Aldactone)  Supplement ==================================================================== For clinical consultation, please call [phone]. ====================================================================    UDS interpretation: Compliant          Medication Assessment Form: Reviewed. Patient indicates being compliant with therapy Treatment compliance: Compliant Risk Assessment: Aberrant Behavior: None observed today Substance Use Disorder (SUD) Risk Level: Low-to-moderate Risk of opioid abuse or dependence: 0.7-3.0% with doses ? 36 MME/day and 6.1-26% with doses ? 120 MME/day. Opioid Risk Tool (ORT) Score: Total Score: 6 Moderate Risk for SUD (Score between 4-7) Depression Scale Score: PHQ-2: PHQ-2 Total Score: 0 No depression (0) PHQ-9: PHQ-9 Total Score: 0 No depression (0-4)  Pharmacologic Plan: No change in therapy, at this time  Laboratory Chemistry  Inflammation Markers Lab Results  Component Value Date   ESRSEDRATE 35* 01/08/2015   CRP 2.8* 01/08/2015  Renal Function Lab Results  Component Value Date   BUN 9 06/24/2015   CREATININE 0.85 06/24/2015   GFRAA >60 06/24/2015   GFRNONAA >60 06/24/2015    Hepatic Function Lab Results  Component Value Date   AST 46* 06/24/2015   ALT 67* 06/24/2015   ALBUMIN 4.5 06/24/2015    Electrolytes Lab Results  Component Value  Date   NA 137 06/24/2015   K 4.0 06/24/2015   CL 103 06/24/2015   CALCIUM 9.0 06/24/2015   MG 1.9 01/08/2015    Pain Modulating Vitamins Lab Results  Component Value Date   VD25OH 14.3* 01/08/2015   VD125OH2TOT 28.8 01/08/2015   VITAMINB12 172* 01/08/2015    Coagulation Parameters Lab Results  Component Value Date   INR 1.16 09/07/2014   LABPROT 15.0 09/07/2014   APTT 24.6 08/26/2013   PLT 104* 06/24/2015    Note: Labs Reviewed.  Recent Diagnostic Imaging  Dg Chest 2 View  06/24/2015  CLINICAL DATA:  Chest pain. EXAM: CHEST  2 VIEW COMPARISON:  05/27/2015 FINDINGS: Low lung volumes. Heart and mediastinal contours are within normal limits. No focal opacities or effusions. No acute bony abnormality. IMPRESSION: Low volumes.  No active disease. Electronically Signed   By: Rolm Baptise M.D.   On: 06/24/2015 19:08   Ct Head Wo Contrast  06/24/2015  CLINICAL DATA:  Pt with GERD and reports fatigue symptoms onset tonight, also states she is constipated. Pt also adds that her neck and jaw having been hurting. Hx: uterine cancer with hysterectomy 28 years ago, tubal ligation, cholecystectomy,Gerd, copd,^ EXAM: CT HEAD WITHOUT CONTRAST TECHNIQUE: Contiguous axial images were obtained from the base of the skull through the vertex without intravenous contrast. COMPARISON:  03/11/2015 FINDINGS: Brain: No evidence of acute infarction, hemorrhage, extra-axial collection, ventriculomegaly, or mass effect. Vascular: No hyperdense vessel or unexpected calcification. Skull: Negative for fracture or focal lesion. Sinuses/Orbits: No acute findings. Other: None. IMPRESSION: 1. Negative for bleed or other acute intracranial process. Electronically Signed   By: Lucrezia Europe M.D.   On: 06/24/2015 23:19   Ct Abdomen Pelvis W Contrast  06/24/2015  CLINICAL DATA:  Pt with GERD and reports fatigue symptoms onset tonight, also states she is constipated. Pt also adds that her neck and jaw having been hurting. Hx:  uterine cancer with hysterectomy 28 years ago, tubal ligation, cholecystectomy,Gerd, copd EXAM: CT ABDOMEN AND PELVIS WITH CONTRAST TECHNIQUE: Multidetector CT imaging of the abdomen and pelvis was performed using the standard protocol following bolus administration of intravenous contrast. CONTRAST:  138m ISOVUE-300 IOPAMIDOL (ISOVUE-300) INJECTION 61% COMPARISON:  03/11/2015 FINDINGS: Lower chest:  No acute findings. Hepatobiliary: Stable probable cysts in its hepatic segments 2 and 3. No new liver lesion. Cholecystectomy clips. No biliary ductal dilatation. Gas in biliary tree implying patent sphincterotomy. Pancreas: No mass, inflammatory changes, or other significant abnormality. Spleen: Within normal limits in size and appearance. Adrenals/Urinary Tract: No masses identified. No evidence of hydronephrosis. Stomach/Bowel: Stomach is distended by ingested material. Small bowel and colon are nondilated. Normal appendix. Vascular/Lymphatic: Portal vein patent. Scattered aortic calcifications without aneurysm. Embolization coils in the left inferior epigastric artery. No adenopathy localized. Bilateral pelvic phleboliths. Reproductive: No mass or other significant abnormality. Previous hysterectomy. Other: No ascites.  No free air. Musculoskeletal: Spondylitic changes in the lower cervical spine. Negative for fracture. IMPRESSION: 1. No acute abdominal process. 2. Stable postop changes. Electronically Signed   By: DLucrezia EuropeM.D.   On: 06/24/2015 23:25   Thoracic Imaging: Thoracic MR  wo contrast:  Results for orders placed during the hospital encounter of 03/06/15  MR Thoracic Spine Wo Contrast   Narrative CLINICAL DATA:  Diffuse mid back pain with bilateral arm and leg pain since 2008.  EXAM: MRI THORACIC SPINE WITHOUT CONTRAST  TECHNIQUE: Multiplanar, multisequence MR imaging of the thoracic spine was performed. No intravenous contrast was administered.  COMPARISON:   08/06/2014  FINDINGS: Small hemangiomas noted in the T3 and T11 vertebra. There is increased T2 signal in the right transverse process of T6, probably with increased T1 signal on image 3 series 5, and with trabecular coarsening in this vicinity on prior exams including the chest CT from 08/05/2007, favoring hemangioma.  Several scattered tiny T2 hyperintense lesions are present in the liver and are nonspecific.  No significant abnormal spinal cord signal is observed. There is mild dextroconvex thoracic scoliosis but no malalignment.  Several small Schmorl's nodes are present in the lower thoracic spine.  Additional findings at individual levels are as follows:  T1-2:  No impingement.  Central disc protrusion.  T2-3: Moderate right foraminal stenosis due to right facet arthropathy. Central disc protrusion.  T3-4:  Mild right foraminal stenosis due to facet arthropathy.  T4-5:  Mild right foraminal stenosis due to facet arthropathy.  T5-6: Mild left eccentric central narrowing of the thecal sac due to left facet arthropathy, image 17 series 7.  T6-7: Mild left foraminal stenosis due to intervertebral spurring. Disc bulge.  T7-8: Borderline central narrowing of the thecal sac due to focal right paracentral spurring.  T8-9:  Mild bilateral foraminal stenosis due to facet arthropathy.  T9-10:  Borderline left foraminal stenosis due to facet arthropathy.  T10-11: Moderate central narrowing of the thecal sac due to a central disc protrusion with associated spurring. Mild left foraminal stenosis due to facet arthropathy.  T11-12: Mild left foraminal stenosis due to left paracentral disc protrusion and facet spurring.  T12-L1:  No impingement.  Right paracentral disc protrusion.  L1-2: No impingement. Central disc protrusion. This level is only included on the parasagittal images.  IMPRESSION: 1. 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 2. Several vertebral hemangiomas, the largest of which is in the right T6 transverse process. 3. Scattered tiny T2 hyperintense liver lesions are probably cysts and have been seen on prior CT scans, but technically nonspecific on today's MRI.   Electronically Signed   By: Van Clines M.D.   On: 03/06/2015 11:52    Lumbosacral Imaging: Lumbar MR wo contrast:  Results for orders placed during the hospital encounter of 02/03/15  MR Lumbar Spine Wo Contrast   Narrative CLINICAL DATA:  50 year old female with right side lumbar back pain for 2 weeks, progressively increasing. Left S1 radicular pain. Initial encounter. Remote fall in 2008.  EXAM: MRI LUMBAR SPINE WITHOUT CONTRAST  TECHNIQUE: Multiplanar, multisequence MR imaging of the lumbar spine was performed. No intravenous contrast was administered.  COMPARISON:  CT Abdomen and Pelvis 01/29/2015 and earlier  FINDINGS: Normal lumbar segmentation demonstrated on the comparison. Straightening of lumbar lordosis, otherwise normal vertebral height and alignment. Bone marrow signal within normal limits. No marrow edema or evidence of acute osseous abnormality. Visible sacrum intact.  No signal abnormality in the visualized lower thoracic spinal cord. Conus medullaris appears normal at L1-L2.  Decreased T2 signal in the visible liver and spleen. Stable visualized abdominal viscera, small  retroperitoneal lymph nodes. Negative visualized posterior paraspinal soft tissues.  T10-T11: Bulky but incompletely visualized central disc extrusion suggested at T10-T11 (series 2, image 8). This disc herniation is seen to be partially calcified on the recent CT Abdomen and Pelvis comparison. Those images suggest moderate associated lower thoracic spinal stenosis.  T11-T12: Small central disc protrusion.  Mild to moderate left facet hypertrophy. Moderate left T11 foraminal stenosis.  T12-L1: Right paracentral disc protrusion with no significant stenosis.  L1-L2: Left paracentral and caudal disc extrusion (series 5, image 10) is mild. Borderline to mild spinal stenosis at this level without foraminal involvement.  L2-L3:  Minimal disc bulge.  Mild facet hypertrophy.  No stenosis.  L3-L4:  Mild disc bulge.  Moderate facet hypertrophy.  No stenosis.  L4-L5: Mild circumferential disc bulge. Moderate to severe facet hypertrophy. Borderline to mild spinal stenosis (series 5, image 29). No convincing lateral recess or foraminal stenosis.  L5-S1:  Mild disc bulge and facet hypertrophy.  No stenosis.  IMPRESSION: 1. Disc degeneration maximal in the lower thoracic and upper lumbar spine. Suspect up to moderate lower thoracic spinal stenosis at T10-T11 related to a partially calcified disc herniation which is partially visible on this study. Follow-up thoracic spine MRI would evaluate further as necessary. 2. Mild multifactorial lumbar spinal stenosis at L1-L2 and L4-L5. 3. Moderate left T11 neural foraminal stenosis. No lumbar foraminal stenosis. 4.  No acute osseous abnormality in the lumbar spine.   Electronically Signed   By: Genevie Ann M.D.   On: 02/03/2015 11:42    Lumbar MR wo contrast:  Results for orders placed in visit on 02/28/10  MR L Spine Ltd W/O Cm   Narrative * PRIOR REPORT IMPORTED FROM AN EXTERNAL SYSTEM *   PRIOR REPORT IMPORTED FROM THE [facility] WORKFLOW SYSTEM   REASON FOR EXAM:    low back pain  COMMENTS:   PROCEDURE:     MR  - MR LUMBAR SPINE WO CONTRAST  - Feb 28 2010  5:14PM   RESULT:     MRI LUMBAR SPINE WITHOUT CONTRAST   HISTORY: Low back pain   COMPARISON: None   TECHNIQUE: Multiplanar and multisequence MRI of the lumbar spine were  obtained, without administration of IV contrast.   FINDINGS:   The vertebral bodies of the lumbar spine are normal  in size and alignment.  There is normal bone marrow signal demonstrated throughout the vertebra.  The  intervertebral disc spaces are well-maintained.   The spinal cord is of normal volume and contour. The cord terminates  normally at L1 . The nerve roots of the cauda equina and the filum  terminale  have the usual appearance.   The visualized portions of the SI joints are unremarkable.   The imaged intra-abdominal contents are unremarkable.   T10-T11: There is a small central disc protrusion abutting the ventral  thoracic spinal cord.   T12-L1: Small right paracentral disc protrusion. No evidence of neural  foraminal or central stenosis.   L1-L2: Small-moderate central disc protrusion with mild mass effect upon  the  ventral thecal sac. No evidence of neural foraminal or central stenosis.   L2-L3: No significant disc bulge. No evidence of neural foraminal or  central  stenosis. Mild bilateral facet arthropathy.   L3-L4: No significant disc bulge. No evidence of neural foraminal or  central  stenosis. Moderate bilateral facet arthropathy.   L4-L5: Mild broad-based disc bulge. Severe bilateral facet arthropathy.  Mild-moderate spinal stenosis. No evidence of neural foraminal stenosis.  L5-S1: No significant disc bulge. No evidence of neural foraminal or  central  stenosis. Mild bilateral facet arthropathy.   IMPRESSION:   1. Lumbar spine spondylosis as described above.       Lumbar DG 2-3 views:  Results for orders placed in visit on 08/27/09  DG Lumbar Spine 2-3 Views   Narrative * PRIOR REPORT IMPORTED FROM AN EXTERNAL SYSTEM *   PRIOR REPORT IMPORTED FROM THE [facility] WORKFLOW SYSTEM   REASON FOR EXAM:    trauma  COMMENTS:   PROCEDURE:     DXR - DXR LUMBAR SPINE AP AND LATERAL  - Aug 27 2009   1:30PM   RESULT:     Comparison: None   Findings:   AP and lateral views of the lumbar spine and a coned down view of the  lumbosacral junction are provided.    There are 5 nonrib bearing lumbar-type vertebral bodies. The vertebral  body  heights are maintained. The alignment is anatomic. There is no  spondylolysis. There is no acute fracture or static listhesis. The disc  spaces are maintained.   The SI joints are unremarkable.   IMPRESSION:   1. No acute osseous abnormality of the lumbar spine.       Note: Imaging reviewed. Results explained to patient in Layman's terms.  Meds  The patient has a current medication list which includes the following prescription(s): albuterol, alprazolam, aspirin ec, dicyclomine, famotidine, fluticasone, intrinsi b12-folate, furosemide, gabapentin, glipizide, hydrochlorothiazide, lactulose, naloxone, nystatin, omeprazole, oxycodone-acetaminophen, oxycodone-acetaminophen, oxycodone-acetaminophen, promethazine, rifaximin, rizatriptan, spironolactone, and vitamin b-12.  Current Outpatient Prescriptions on File Prior to Visit  Medication Sig  . albuterol (PROVENTIL HFA;VENTOLIN HFA) 108 (90 BASE) MCG/ACT inhaler Inhale 2 puffs into the lungs every 4 (four) hours as needed for wheezing or shortness of breath.  . ALPRAZolam (XANAX) 1 MG tablet Take 1-2 mg by mouth 3 (three) times daily as needed for anxiety.  Marland Kitchen aspirin EC 81 MG tablet Take 81 mg by mouth daily.   Marland Kitchen dicyclomine (BENTYL) 10 MG capsule Take 10 mg by mouth 4 (four) times daily -  before meals and at bedtime.   . famotidine (PEPCID) 20 MG tablet Take 20 mg by mouth daily.   . fluticasone (FLONASE) 50 MCG/ACT nasal spray Place 1-2 sprays into both nostrils daily as needed for rhinitis.   Derald Macleod Factor (INTRINSI B12-FOLATE) [phone] MCG-MCG-MG TABS Take 1 tablet by mouth daily. Reported on 04/06/2015  . furosemide (LASIX) 40 MG tablet Take 40 mg by mouth daily.   Marland Kitchen gabapentin (NEURONTIN) 300 MG capsule Take 900 mg by mouth 4 (four) times daily as needed (for pain).   Marland Kitchen glipiZIDE (GLUCOTROL) 5 MG tablet Take 5 mg by mouth daily before  breakfast.  . hydrochlorothiazide (HYDRODIURIL) 25 MG tablet Take 25 mg by mouth at bedtime.   Marland Kitchen lactulose (CHRONULAC) 10 GM/15ML solution Take by mouth.  . naloxone [facility]) 2 MG/2ML injection Inject into the vein as needed.  . nystatin (MYCOSTATIN) powder Apply 1 g topically 3 (three) times daily as needed (for irritation).   Marland Kitchen omeprazole (PRILOSEC) 20 MG capsule Take 20 mg by mouth 2 (two) times daily before a meal.  . promethazine (PHENERGAN) 12.5 MG tablet Take 12.5 mg by mouth every 6 (six) hours as needed for nausea or vomiting.   . rifaximin (XIFAXAN) 550 MG TABS tablet Take 550 mg by mouth 2 (two) times daily.   . rizatriptan (MAXALT) 10 MG tablet Take 10 mg by mouth as needed for  migraine.   Marland Kitchen spironolactone (ALDACTONE) 100 MG tablet Take 100 mg by mouth daily.   . vitamin B-12 (CYANOCOBALAMIN) 1000 MCG tablet Take 2,000 mcg by mouth daily.  . [DISCONTINUED] SUMAtriptan (IMITREX) 50 MG tablet Take 50 mg by mouth every 2 (two) hours as needed. For migraines   No current facility-administered medications on file prior to visit.    ROS  Constitutional: Denies any fever or chills Gastrointestinal: No reported hemesis, hematochezia, vomiting, or acute GI distress Musculoskeletal: Denies any acute onset joint swelling, redness, loss of ROM, or weakness Neurological: No reported episodes of acute onset apraxia, aphasia, dysarthria, agnosia, amnesia, paralysis, loss of coordination, or loss of consciousness  Allergies  Lori Mckenzie is allergic to tape and vicodin.  [place]  Medical:  Lori Mckenzie  has a past medical history of Brittle bone disease; Asthma; Thyroid disease; Hypertension; Back pain; Diabetes mellitus without complication ([place]); Chronic kidney disease; Anxiety; GERD (gastroesophageal reflux disease); Sleep apnea; COPD (chronic obstructive pulmonary disease) ([place]); Hypothyroidism; Cervical disc disease; TIA (transient ischemic attack); Cancer ([place]); Collagen vascular disease ([place]);  Ascites; NASH (nonalcoholic steatohepatitis); Sleep apnea; Migraines; and Respiratory infection. Family: family history includes Heart disease in her brother and sister; Lung cancer in her father; Ulcers in her father and sister. Surgical:  has past surgical history that includes Abdominal hysterectomy; Tubal ligation; Colonoscopy with propofol (N/A, 06/23/2014); Esophagogastroduodenoscopy (N/A, 06/23/2014); Cholecystectomy (N/A, 07/15/2014); ERCP (N/A, 07/16/2014); Wisdom tooth extraction; and ERCP (N/A, 10/03/2014). Tobacco:  reports that she has never smoked. She has never used smokeless tobacco. Alcohol:  reports that she does not drink alcohol. Drug:  reports that she does not use illicit drugs.  Constitutional Exam  Vitals: Blood pressure 117/74, pulse 74, resp. rate 18, height 5' 2"  (1.575 m), weight 250 lb (113.399 kg), SpO2 98 %. General appearance: Well nourished, well developed, and well hydrated. In no acute distress Calculated BMI/Body habitus: Body mass index is 45.71 kg/(m^2). (>40 kg/m2) Extreme obesity (Class III) - 254% higher incidence of chronic pain Psych/Mental status: Alert and oriented x 3 (person, place, & time) Eyes: PERLA Respiratory: No evidence of acute respiratory distress  Cervical Spine Exam  Inspection: No masses, redness, or swelling Alignment: Symmetrical ROM: Functional: ROM is within functional limits [facility]) Stability: No instability detected Muscle strength & Tone: Functionally intact Sensory: Unimpaired Palpation: No complaints of tenderness  Upper Extremity (UE) Exam    Side: Right upper extremity  Side: Left upper extremity  Inspection: No masses, redness, swelling, or asymmetry  Inspection: No masses, redness, swelling, or asymmetry  ROM:  ROM:  Functional: ROM is within functional limits [facility])  Functional: ROM is within functional limits [facility])  Muscle strength & Tone: Functionally intact  Muscle strength & Tone: Functionally intact  Sensory:  Unimpaired  Sensory: Unimpaired  Palpation: Non-contributory  Palpation: Non-contributory   Thoracic Spine Exam  Inspection: No masses, redness, or swelling Alignment: Symmetrical ROM: Functional: ROM is within functional limits [facility]) Stability: No instability detected Sensory: Unimpaired Muscle strength & Tone: Functionally intact Palpation: No complaints of tenderness  Lumbar Spine Exam  Inspection: No masses, redness, or swelling Alignment: Symmetrical ROM: Functional: ROM is within functional limits [facility]) Stability: No instability detected Muscle strength & Tone: Functionally intact Sensory: Unimpaired Palpation: No complaints of tenderness Provocative Tests: Lumbar Hyperextension and rotation test: deferred Patrick's Maneuver: deferred  Gait & Posture Assessment  Ambulation: Unassisted Gait: Unaffected Posture: WNL  Lower Extremity Exam    Side: Right lower extremity  Side: Left lower extremity  Inspection: No masses, redness, swelling, or asymmetry ROM:  Inspection: No masses, redness, swelling, or asymmetry ROM:  Functional: ROM is within functional limits [place])  Functional: ROM is within functional limits [facility])  Muscle strength & Tone: Functionally intact  Muscle strength & Tone: Functionally intact  Sensory: Unimpaired  Sensory: Unimpaired  Palpation: Non-contributory  Palpation: Non-contributory   Assessment & Plan  Primary Diagnosis & Pertinent Problem List: The primary encounter diagnosis was Chronic pain. Diagnoses of Encounter for therapeutic drug level monitoring, Long term current use of opiate analgesic, Chronic low back pain (Location of Primary Source of Pain) (Bilateral) (R>L), Chronic neck pain (Location of Tertiary source of pain) (Bilateral) (R>L), Lumbar facet syndrome (Location of Primary Source of Pain) (Bilateral) (R>L), 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, L1-2, & L4-5), Radicular pain of thoracic region, Chronic shoulder pain (Bilateral)  (R>L), and Chronic hip pain, unspecified laterality were also pertinent to this visit.  Visit Diagnosis: 1. Chronic pain   2. Encounter for therapeutic drug level monitoring   3. Long term current use of opiate analgesic   4. Chronic low back pain (Location of Primary Source of Pain) (Bilateral) (R>L)   5. Chronic neck pain (Location of Tertiary source of pain) (Bilateral) (R>L)   6. Lumbar facet syndrome (Location of Primary Source of Pain) (Bilateral) (R>L)   7. 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, L1-2, & L4-5)   8. Radicular pain of thoracic region   9. Chronic shoulder pain (Bilateral) (R>L)   10. Chronic hip pain, unspecified laterality     Problems updated and reviewed during this visit: Problem  Airway Hyperreactivity    Problem-specific Plan(s): No problem-specific assessment & plan notes found for this encounter.  No new assessment & plan notes have been filed under this hospital service since the last note was generated. Service: Pain Management   Plan of Care   Problem List Items Addressed This Visit      High   Chronic hip pain (Bilateral) (L>R) (Chronic)   Relevant Medications   oxyCODONE-acetaminophen (PERCOCET) 10-325 MG tablet   oxyCODONE-acetaminophen (PERCOCET) 10-325 MG tablet   oxyCODONE-acetaminophen (PERCOCET) 10-325 MG tablet   Other Relevant Orders   HIP INJECTION   Chronic low back pain (Location of Primary Source of Pain) (Bilateral) (R>L) (Chronic)   Relevant Medications   oxyCODONE-acetaminophen (PERCOCET) 10-325 MG tablet   oxyCODONE-acetaminophen (PERCOCET) 10-325 MG tablet   oxyCODONE-acetaminophen (PERCOCET) 10-325 MG tablet   Other Relevant Orders   LUMBAR EPIDURAL STEROID INJECTION   Chronic neck pain (Location of Tertiary source of pain) (Bilateral) (R>L) (Chronic)   Relevant Medications   oxyCODONE-acetaminophen (PERCOCET) 10-325 MG tablet   oxyCODONE-acetaminophen (PERCOCET) 10-325 MG tablet   oxyCODONE-acetaminophen (PERCOCET)  10-325 MG tablet   Other Relevant Orders   CERVICAL EPIDURAL STEROID INJECTION   CERVICAL FACET (MEDIAL BRANCH NERVE BLOCK)    Chronic pain - Primary (Chronic)   Relevant Medications   oxyCODONE-acetaminophen (PERCOCET) 10-325 MG tablet   oxyCODONE-acetaminophen (PERCOCET) 10-325 MG tablet   oxyCODONE-acetaminophen (PERCOCET) 10-325 MG tablet   Chronic shoulder pain (Bilateral) (R>L) (Chronic)   Relevant Orders   SHOULDER INJECTION   SUPRASCAPULAR NERVE BLOCK   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, L1-2, & L4-5)   Relevant Orders   THORACIC EPIDURAL STEROID INJECTION   Lumbar facet syndrome (Location of Primary Source of Pain) (Bilateral) (R>L) (Chronic)   Relevant Medications   oxyCODONE-acetaminophen (PERCOCET) 10-325 MG tablet   oxyCODONE-acetaminophen (PERCOCET) 10-325 MG tablet   oxyCODONE-acetaminophen (PERCOCET) 10-325 MG tablet  Other Relevant Orders   LUMBAR FACET(MEDIAL BRANCH NERVE BLOCK) MBNB   Radicular pain of thoracic region (Chronic)     Medium   Encounter for therapeutic drug level monitoring   Long term current use of opiate analgesic (Chronic)       Pharmacotherapy (Medications Ordered): Meds ordered this encounter  Medications  . oxyCODONE-acetaminophen (PERCOCET) 10-325 MG tablet    Sig: Take 1 tablet by mouth 5 (five) times daily.    Dispense:  150 tablet    Refill:  0    Do not add this medication to the electronic "Automatic Refill" notification system. Patient may have prescription filled one day early if pharmacy is closed on scheduled refill date. Do not fill until: 07/04/15 To last until: 08/03/15  . oxyCODONE-acetaminophen (PERCOCET) 10-325 MG tablet    Sig: Take 1 tablet by mouth 5 (five) times daily.    Dispense:  150 tablet    Refill:  0    Do not add this medication to the electronic "Automatic Refill" notification system. Patient may have prescription filled one day early if pharmacy is closed on scheduled refill date. Do not fill  until: 08/03/15 To last until: 09/02/15  . oxyCODONE-acetaminophen (PERCOCET) 10-325 MG tablet    Sig: Take 1 tablet by mouth 5 (five) times daily.    Dispense:  150 tablet    Refill:  0    Do not add this medication to the electronic "Automatic Refill" notification system. Patient may have prescription filled one day early if pharmacy is closed on scheduled refill date. Do not fill until: 09/02/15 To last until: 10/02/15    [facility] & Procedure Ordered: Orders Placed This Encounter  Procedures  . CERVICAL EPIDURAL STEROID INJECTION  . CERVICAL FACET (MEDIAL BRANCH NERVE BLOCK)   . THORACIC EPIDURAL STEROID INJECTION  . LUMBAR EPIDURAL STEROID INJECTION  . LUMBAR FACET(MEDIAL BRANCH NERVE BLOCK) MBNB  . SHOULDER INJECTION  . SUPRASCAPULAR NERVE BLOCK  . HIP INJECTION    Imaging Ordered: None  Interventional Therapies: Scheduled:  None at this time.    Considering:   1. Diagnostic bilateral lumbar facet block under fluoroscopic guidance and IV sedation.  2. Possible bilateral lumbar facet radiofrequency ablation under fluoroscopic guidance and IV sedation.  3. Diagnostic bilateral cervical facet block under fluoroscopic guidance and IV sedation.  4. Diagnostic L1-2 lumbar epidural steroid injection under fluoroscopic guidance, with or without sedation.  5. Diagnostic, right-sided L4-5 lumbar epidural steroid injection under fluoroscopic guidance and IV sedation.  6. Diagnostic bilateral intra-articular shoulder joint injection under fluoroscopic guidance, with or without sedation.  7. Diagnostic bilateral suprascapular nerve block under fluoroscopic guidance, with or without sedation.  8. Possible bilateral suprascapular nerve radiofrequency ablation under fluoroscopic guidance and IV sedation.  9. Palliative left-sided L5-S1 lumbar epidural steroid injection under fluoroscopic guidance him a with or without sedation.  10. Diagnostic left S1 selective nerve root block under  fluoroscopic guidance, with or without sedation.  11. Diagnostic right-sided cervical epidural steroid injection under fluoroscopic guidance, with or without sedation.  12. Diagnostic T10-11 thoracic epidural steroid injection under fluoroscopic guidance, with or without sedation.  13. Diagnostic bilateral intra-articular hip joint injection under fluoroscopic guidance, with or without sedation.  14. Possible bilateral hip radiofrequency ablation under fluoroscopic guidance and IV sedation.    PRN Procedures:   1. Diagnostic bilateral lumbar facet block under fluoroscopic guidance and IV sedation.  2. Diagnostic bilateral cervical facet block under fluoroscopic guidance and IV sedation.  3. Diagnostic L1-2  lumbar epidural steroid injection under fluoroscopic guidance, with or without sedation.  4. Diagnostic, right-sided L4-5 lumbar epidural steroid injection under fluoroscopic guidance and IV sedation.  5. Diagnostic bilateral intra-articular shoulder joint injection under fluoroscopic guidance, with or without sedation.  6. Diagnostic bilateral suprascapular nerve block under fluoroscopic guidance, with or without sedation.  7. Palliative left-sided L5-S1 lumbar epidural steroid injection under fluoroscopic guidance him a with or without sedation.  8. Diagnostic left S1 selective nerve root block under fluoroscopic guidance, with or without sedation.  9. Diagnostic right-sided cervical epidural steroid injection under fluoroscopic guidance, with or without sedation.  10. Diagnostic T10-11 thoracic epidural steroid injection under fluoroscopic guidance, with or without sedation.  11. Diagnostic bilateral intra-articular hip joint injection under fluoroscopic guidance, with or without sedation.    Referral(s) or Consult(s): None at this time.  New Prescriptions   No medications on file    Medications administered during this visit: Lori Mckenzie had no medications administered during this  visit.  Requested PM Follow-up: Return in about 3 months (around 09/28/2015) for Procedure (PRN - Patient will call), Medication Management, (3-Mo).  Future Appointments Date Time Provider [place]  09/28/2015 9:20 AM Milinda Pointer, MD [facility] None    Primary Care Physician: Ardine Eng, MD Location: [facility] Outpatient Pain Management Facility Note by: Kathlen Brunswick. Dossie Arbour, M.D, DABA, DABAPM, DABPM, DABIPP, FIPP  Pain Score Disclaimer: We use the NRS-11 scale. This is a self-reported, subjective measurement of pain severity with only modest accuracy. It is used primarily to identify changes within a particular patient. It must be understood that outpatient pain scales are significantly less accurate that those used for research, where they can be applied under ideal controlled circumstances with minimal exposure to variables. In reality, the score is likely to be a combination of pain intensity and pain affect, where pain affect describes the degree of emotional arousal or changes in action readiness caused by the sensory experience of pain. Factors such as social and work situation, setting, emotional state, anxiety levels, expectation, and prior pain experience may influence pain perception and show large inter-individual differences that may also be affected by time variables.  Patient instructions provided during this appointment: There are no Patient Instructions on file for this visit.

## 2015-06-29 NOTE — Progress Notes (Signed)
Safety precautions to be maintained throughout the outpatient stay will include: orient to surroundings, keep bed in low position, maintain call bell within reach at all times, provide assistance with transfer out of bed and ambulation. Patient is sick on stomach and states she forgot to bring pills for count.  Reminded to bring to all appointments.

## 2015-06-30 LAB — CULTURE, BLOOD (ROUTINE X 2)
Culture: NO GROWTH
Culture: NO GROWTH

## 2015-07-06 ENCOUNTER — Encounter: Payer: Self-pay | Admitting: Pain Medicine

## 2015-07-24 DIAGNOSIS — E1165 Type 2 diabetes mellitus with hyperglycemia: Secondary | ICD-10-CM | POA: Insufficient documentation

## 2015-07-24 DIAGNOSIS — R4182 Altered mental status, unspecified: Secondary | ICD-10-CM | POA: Insufficient documentation

## 2015-07-24 DIAGNOSIS — IMO0002 Reserved for concepts with insufficient information to code with codable children: Secondary | ICD-10-CM | POA: Insufficient documentation

## 2015-07-24 IMAGING — CT CT ABD-PELV W/ CM
2 of 5 series · 16 of 46 positions shown, 18 images · IV contrast (omnipaque)
Comparison: 11/16/2013

CLINICAL DATA: Rectal bleeding and abdominal pain. Dark bloody
stools for 1 week. Right lower quadrant abdominal and right flank
pain with nausea and vomiting for 2 days. History of uterine cancer
28 years ago.

EXAM:
CT ABDOMEN AND PELVIS WITH CONTRAST
TECHNIQUE: Multidetector CT imaging of the abdomen and pelvis was performed
using the standard protocol following bolus administration of
intravenous contrast.
CONTRAST:  125 ml Omnipaque 300

[Series 2: routine abd pel with · axial · 0.93mm/px · z∈[-484,-8]mm · 13 of 107 slices shown, 15 images]
[im 6/107  soft-tissue]
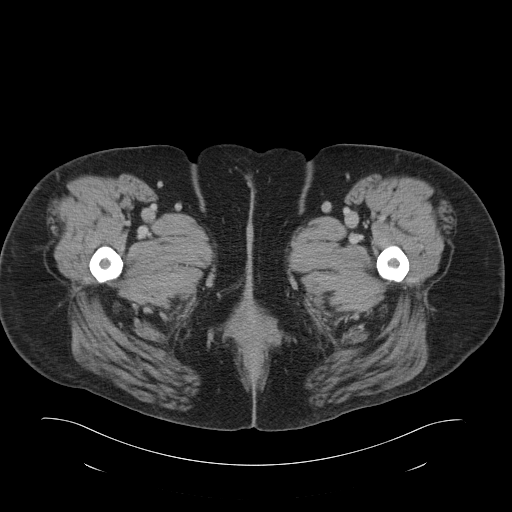
[im 6/107  bone]
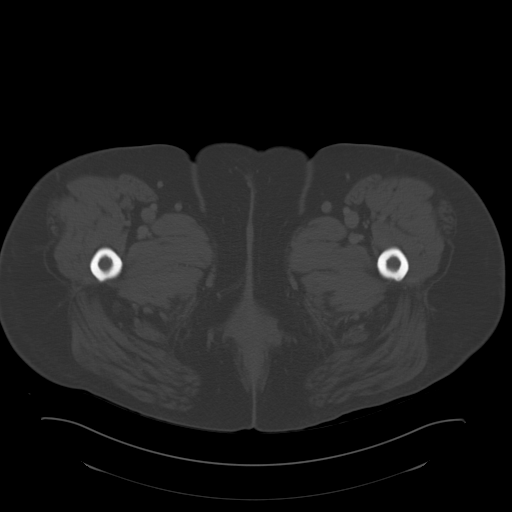
[im 12/107  soft-tissue]
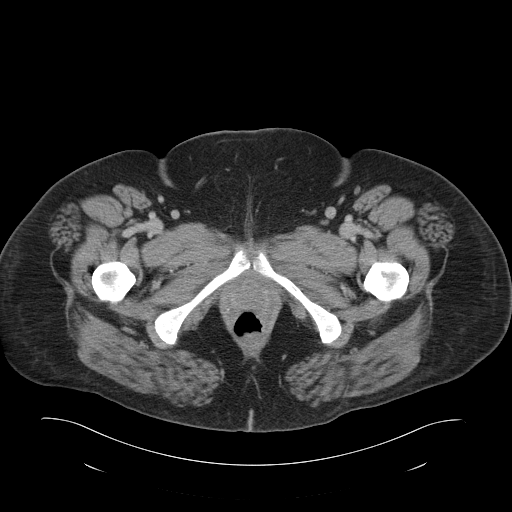
[im 24/107  soft-tissue]
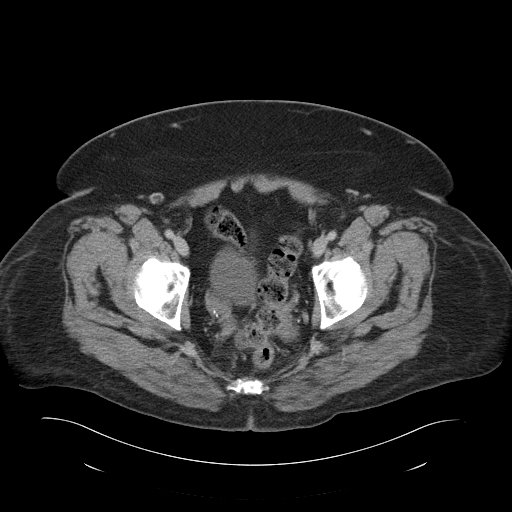
[im 30/107  soft-tissue]
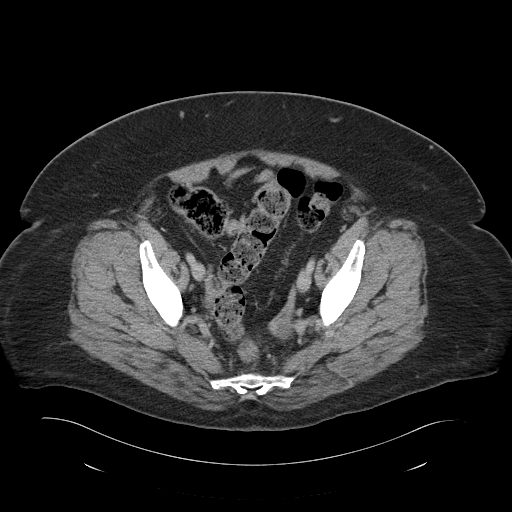
[im 36/107  soft-tissue]
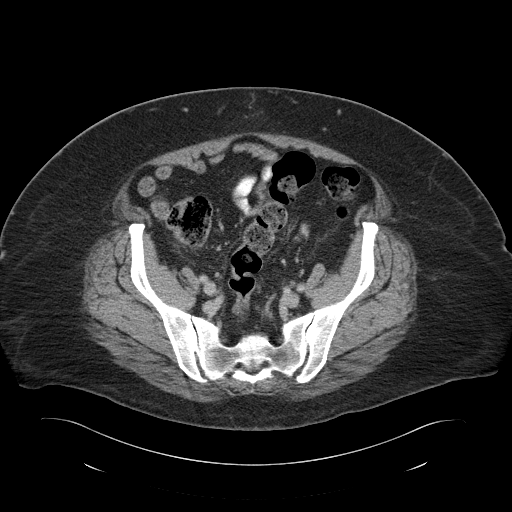
[im 48/107  soft-tissue]
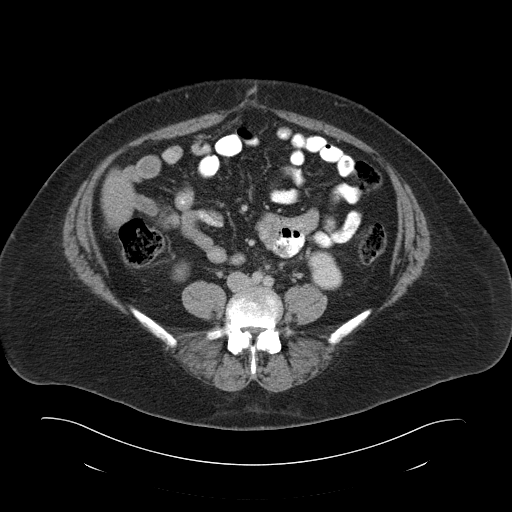
[im 54/107  soft-tissue]
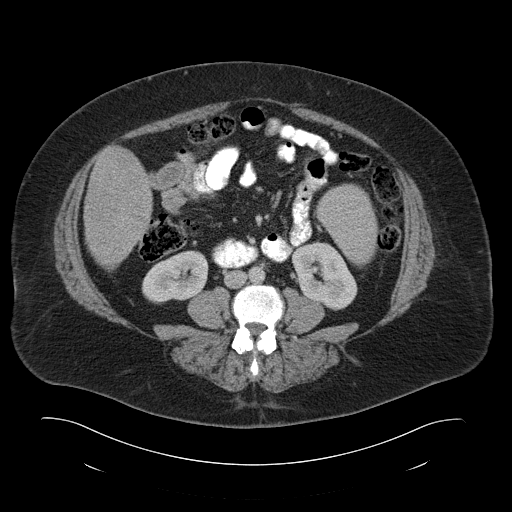
[im 59/107  soft-tissue]
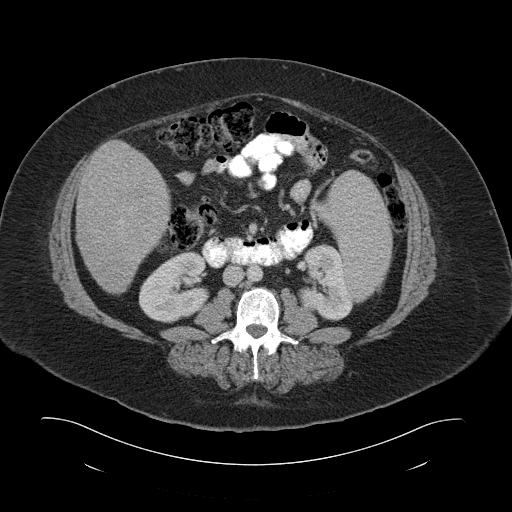
[im 71/107  soft-tissue]
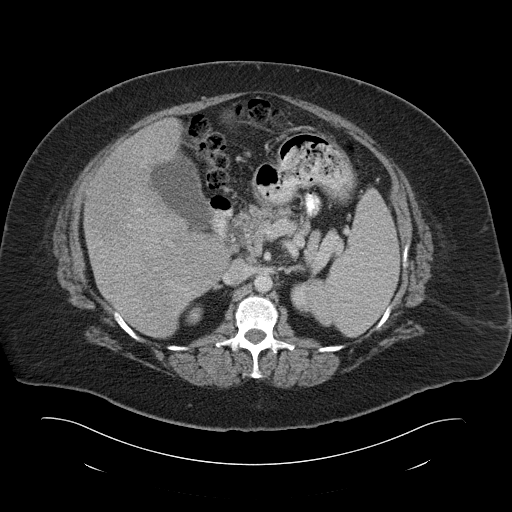
[im 71/107  bone]
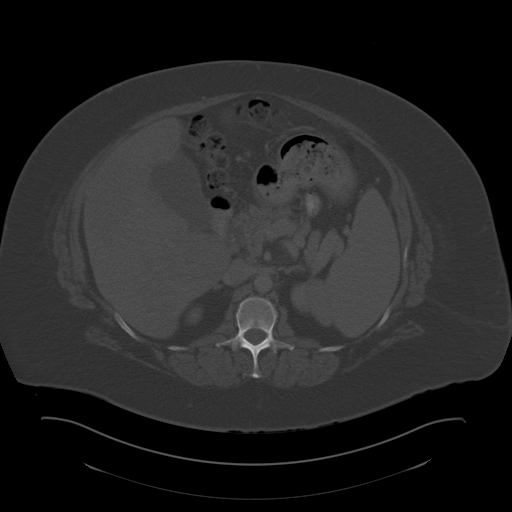
[im 77/107  soft-tissue]
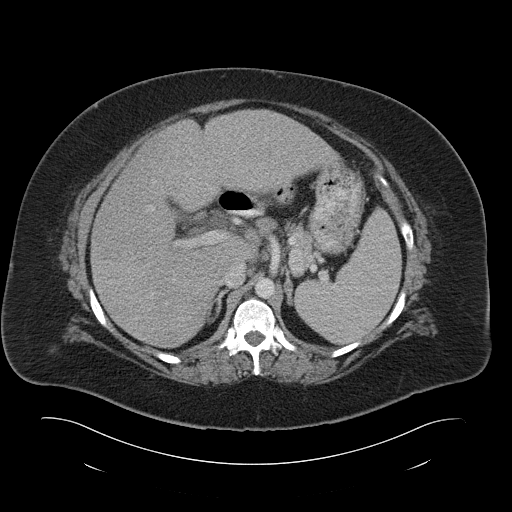
[im 83/107  soft-tissue]
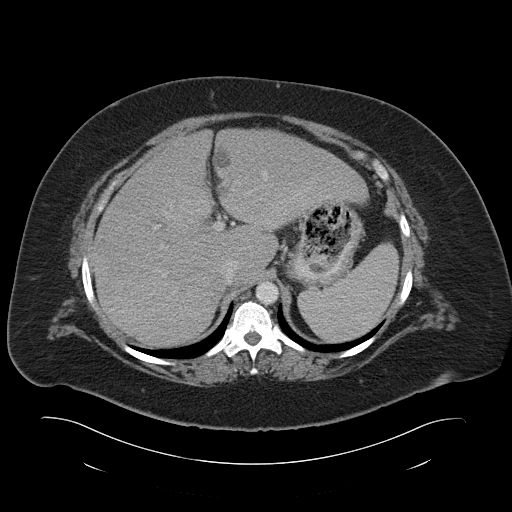
[im 95/107  soft-tissue]
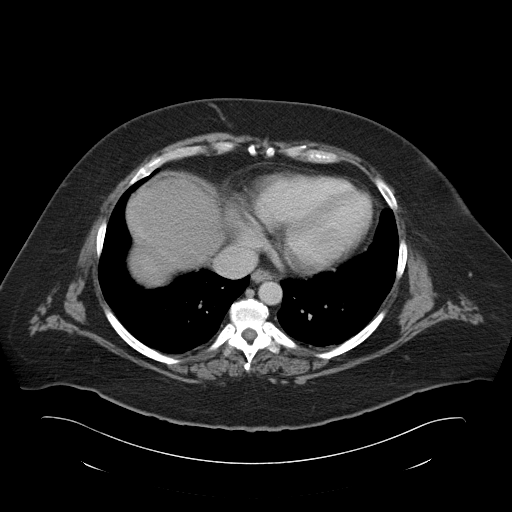
[im 101/107  soft-tissue]
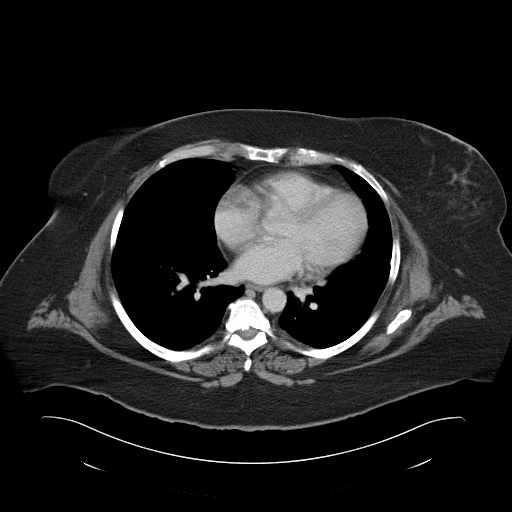

[Series 6: cor routine abd pel with · coronal · 0.91mm/px · 3 of 147 slices shown]
[im 49/147  soft-tissue]
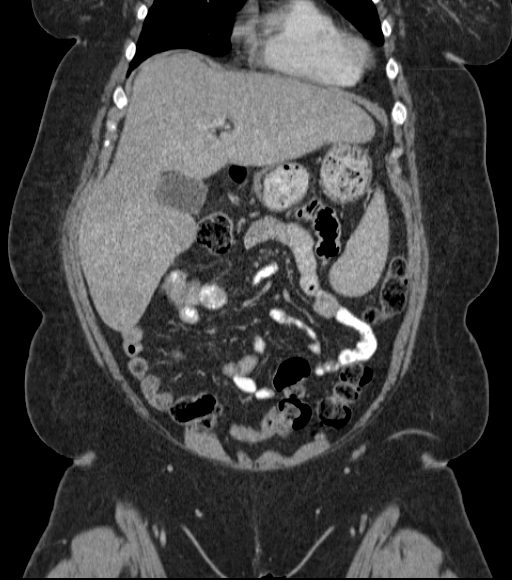
[im 65/147  soft-tissue]
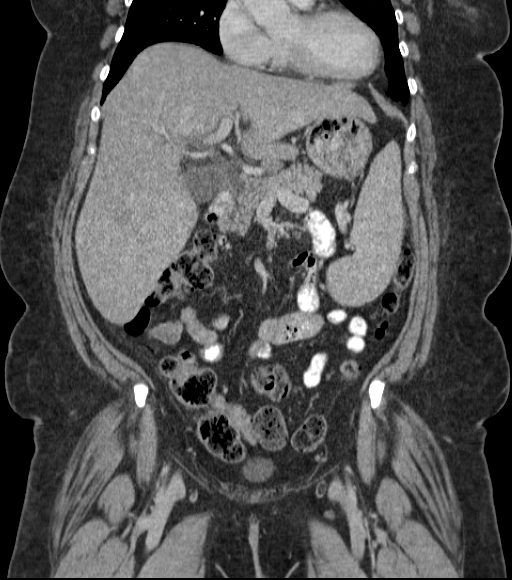
[im 82/147  soft-tissue]
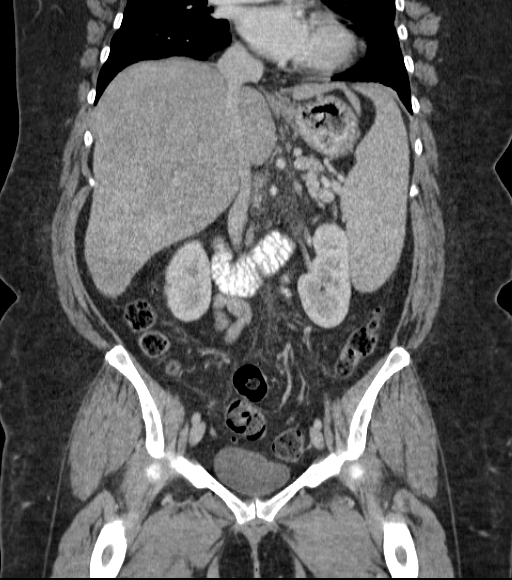

[16 of 46 positions shown; findings below may reference images not displayed]

FINDINGS: Dependent atelectasis in the lung bases.

Diffuse fatty infiltration of the liver. Low-attenuation lesion
adjacent to the falciform ligament may represent focal fatty
infiltration. Scattered sub cm low-attenuation lesions most likely
to represent small cysts or hemangiomas. Gallbladder is mildly
distended with suggestion of wall thickening. No gallstones are
identified. No bile duct dilatation. Spleen is mildly enlarged.
Pancreas, adrenal glands, kidneys, abdominal aorta, inferior vena
cava, and retroperitoneal lymph nodes are unremarkable. Stomach and
small bowel are decompressed. Diffusely stool-filled colon without
abnormal distention. No the bowel or colonic wall thickening is
appreciated where visible. No free air or free fluid in the abdomen.

Pelvis: Uterus is surgically absent. Adnexal structures are not
enlarged. No pelvic mass or lymphadenopathy. Bladder wall is not
thickened. No free or loculated pelvic fluid collections. Appendix
is normal. No evidence of diverticulitis. Mild degenerative changes
in the spine and hips. No destructive bone lesions.
IMPRESSION: Diffuse fatty infiltration of the liver. Scattered small liver
lesions appear benign. Mild splenic enlargement. Gallbladder wall
appears thickened and possibly inflammatory. No stones or bile duct
dilatation identified. No evidence of bowel obstruction or
inflammatory process.

## 2015-08-10 IMAGING — US ABDOMEN ULTRASOUND LIMITED
1 series · 14 of 25 positions shown · non-contrast
Comparison: Abdominal CT 03/10/2014

CLINICAL DATA: Right upper quadrant pain and tenderness.

EXAM:
US ABDOMEN LIMITED - RIGHT UPPER QUADRANT

[Series 1: abdomen ultrasound limited · 0.23mm/px · 14 of 51 slices shown]
[im 1/51]
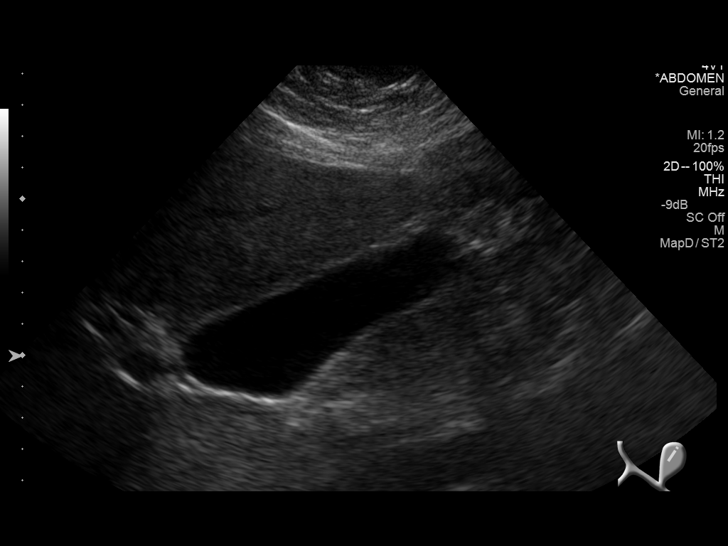
[im 5/51]
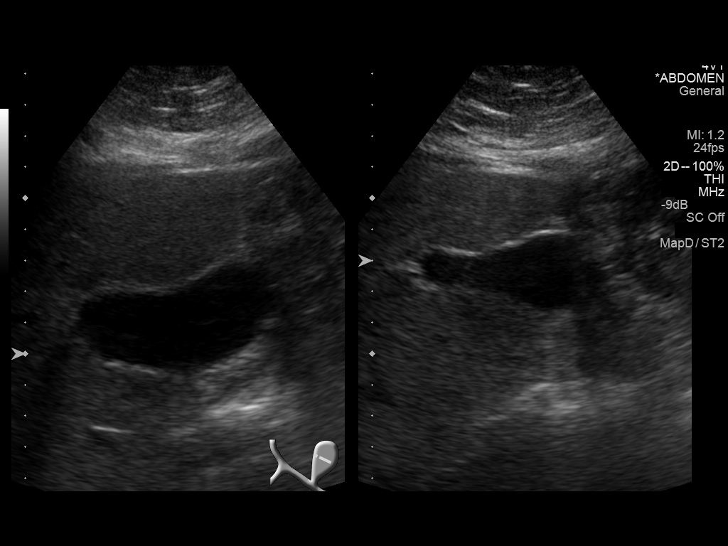
[im 9/51]
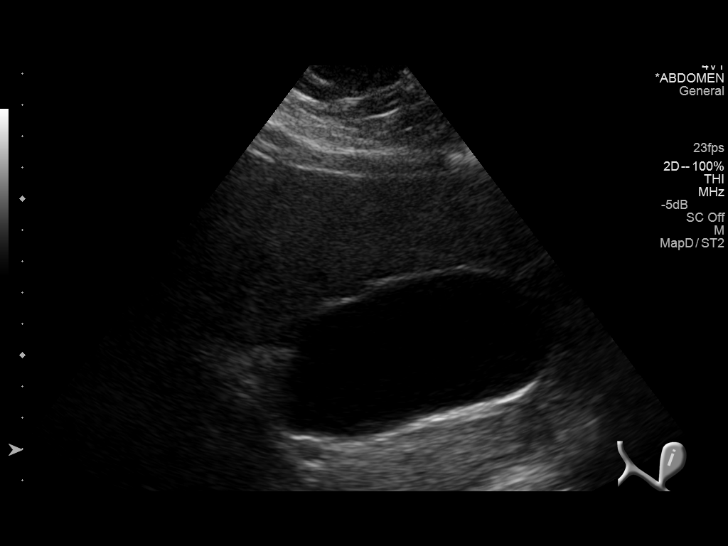
[im 13/51]
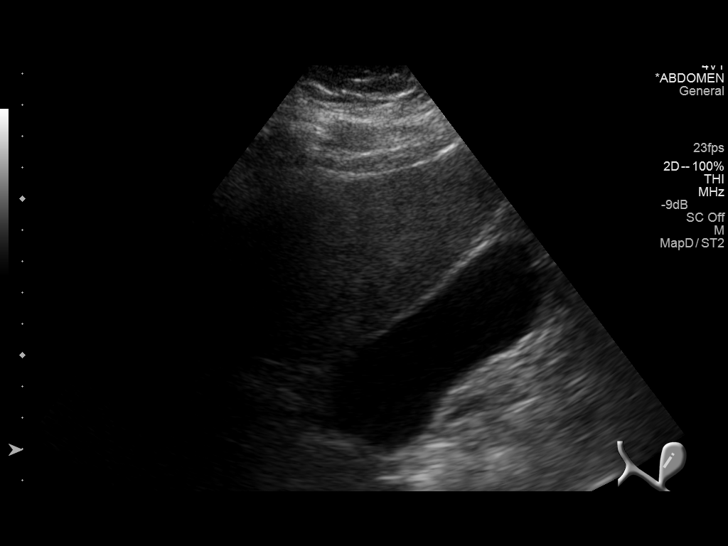
[im 17/51]
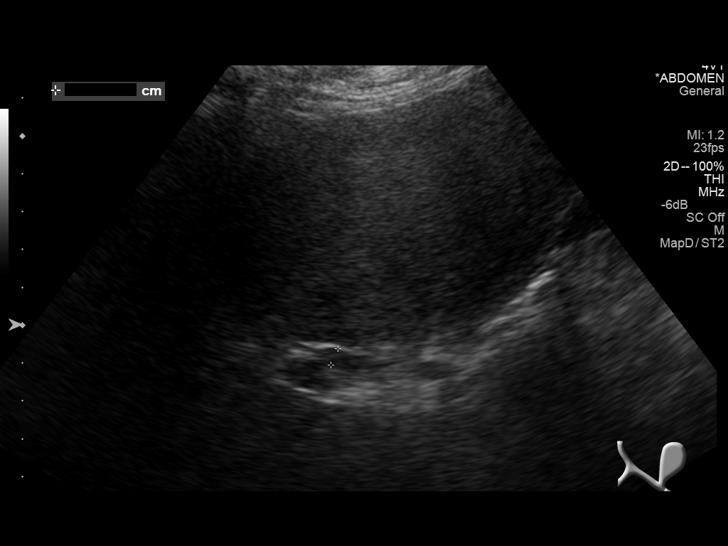
[im 19/51]
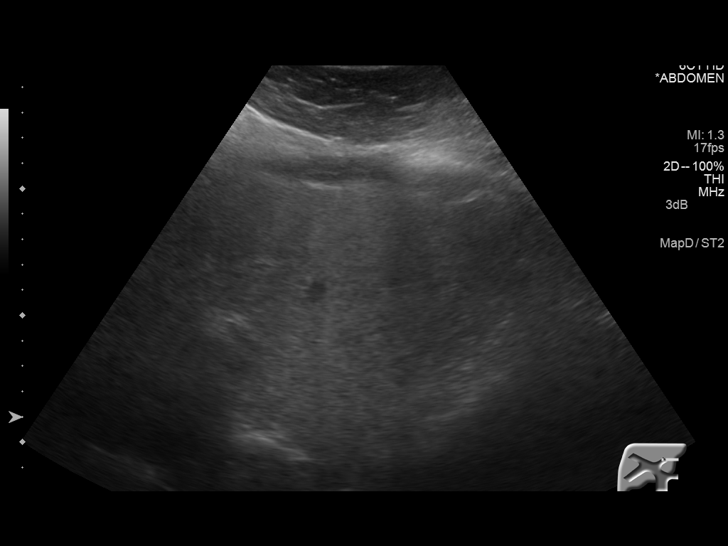
[im 23/51]
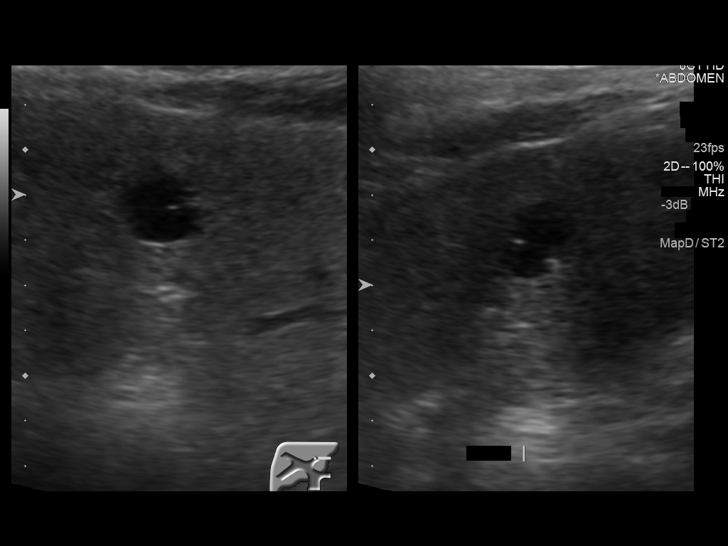
[im 28/51]
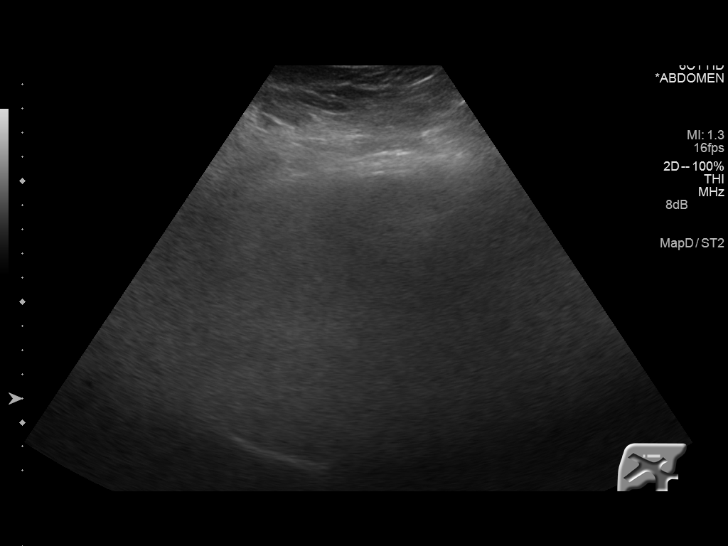
[im 32/51]
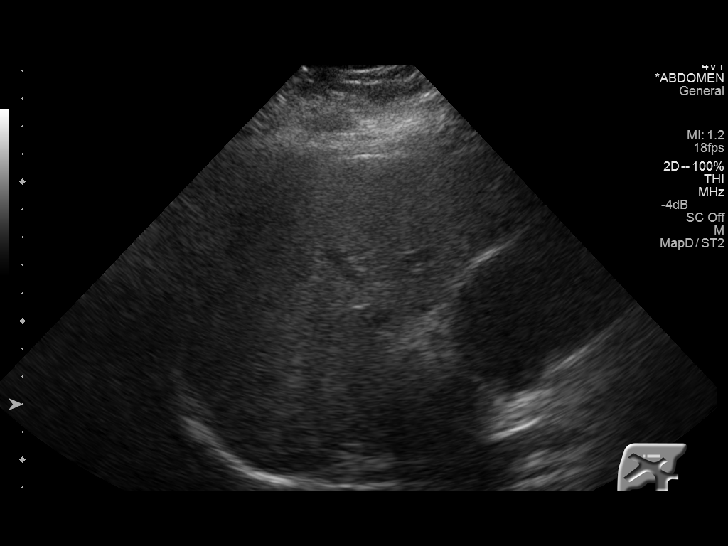
[im 34/51]
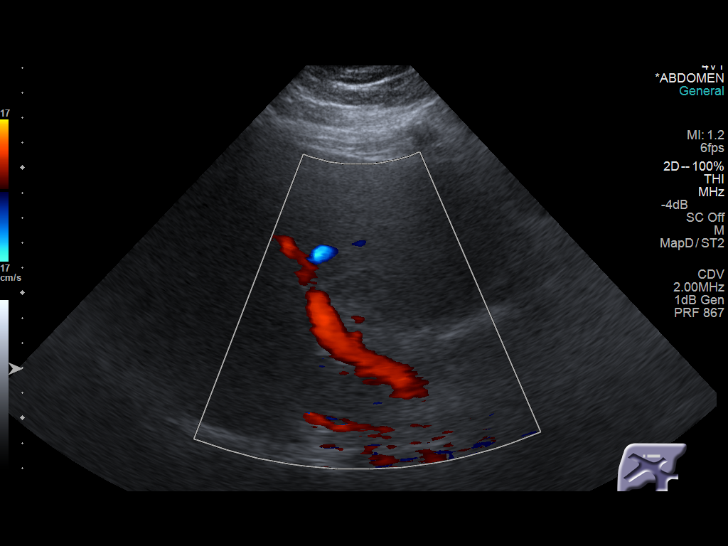
[im 38/51]
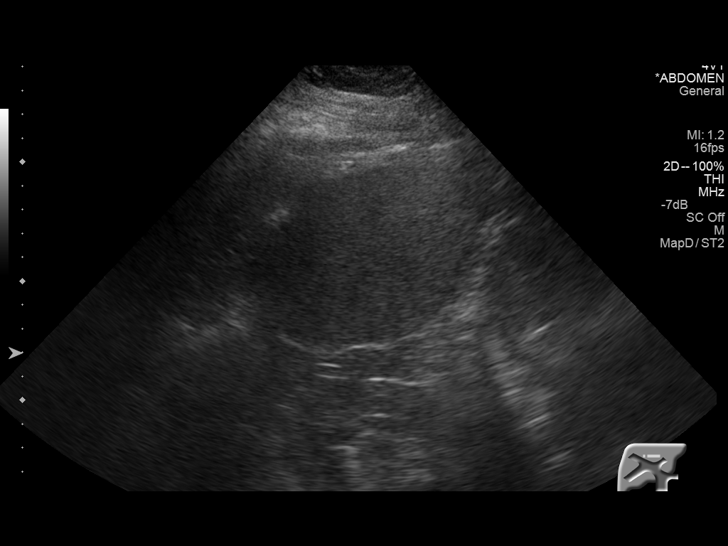
[im 42/51]
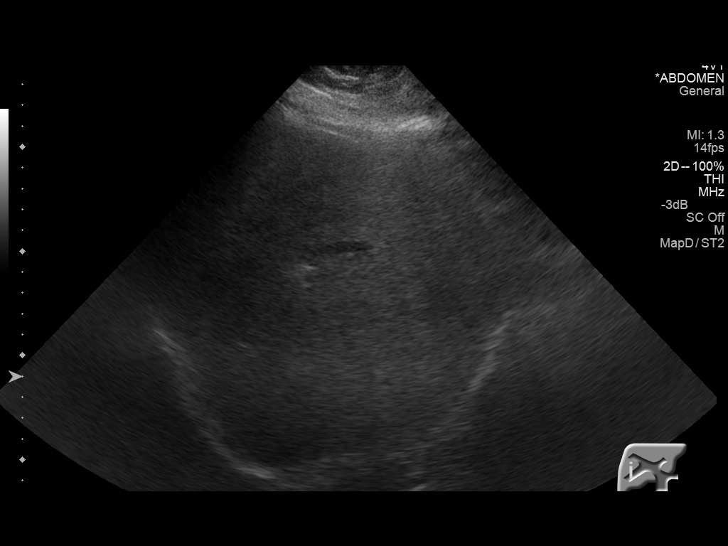
[im 46/51]
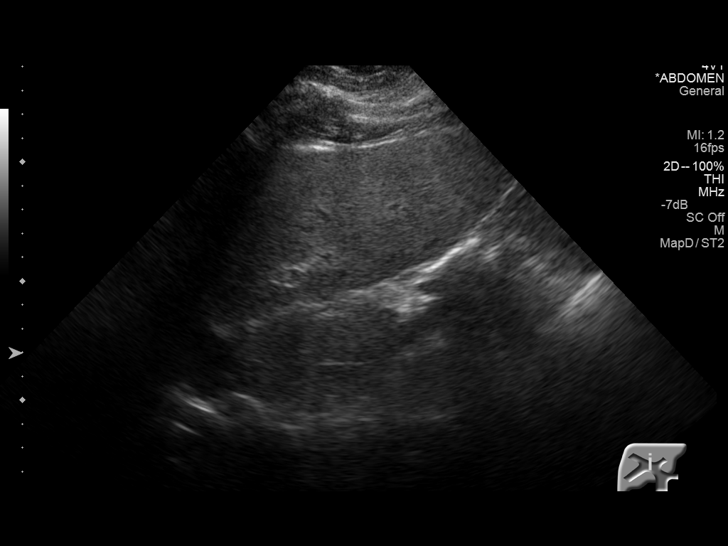
[im 51/51]
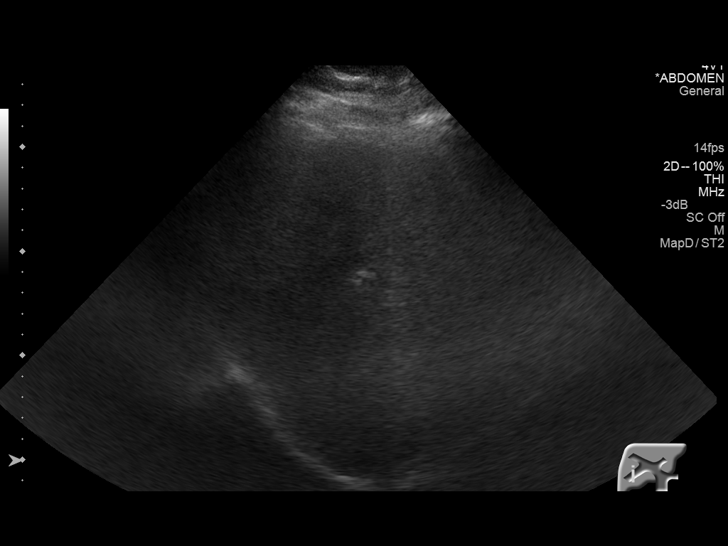

[14 of 25 positions shown; findings below may reference images not displayed]

FINDINGS: Gallbladder:

No gallstones or wall thickening visualized. No sonographic Murphy
sign noted.

Common bile duct:

Diameter: 5 mm

Liver:

Dense liver with poor acoustic penetration. A lobulated cyst is
noted in the left liver, up to 2 cm in diameter. There is antegrade
flow in the imaged hepatic and portal venous system.
IMPRESSION: 1. Negative gallbladder.
2. Hepatic steatosis.

## 2015-08-11 ENCOUNTER — Other Ambulatory Visit: Payer: Self-pay | Admitting: Pain Medicine

## 2015-08-19 ENCOUNTER — Emergency Department
Admission: EM | Admit: 2015-08-19 | Discharge: 2015-08-19 | Disposition: A | Discharge status: Home / Self Care | Payer: Medicaid Other | Attending: Emergency Medicine | Admitting: Emergency Medicine

## 2015-08-19 ENCOUNTER — Encounter: Payer: Self-pay | Admitting: *Deleted

## 2015-08-19 DIAGNOSIS — R109 Unspecified abdominal pain: Secondary | ICD-10-CM | POA: Diagnosis not present

## 2015-08-19 DIAGNOSIS — I129 Hypertensive chronic kidney disease with stage 1 through stage 4 chronic kidney disease, or unspecified chronic kidney disease: Secondary | ICD-10-CM | POA: Insufficient documentation

## 2015-08-19 DIAGNOSIS — J449 Chronic obstructive pulmonary disease, unspecified: Secondary | ICD-10-CM | POA: Insufficient documentation

## 2015-08-19 DIAGNOSIS — N189 Chronic kidney disease, unspecified: Secondary | ICD-10-CM | POA: Insufficient documentation

## 2015-08-19 DIAGNOSIS — R739 Hyperglycemia, unspecified: Secondary | ICD-10-CM

## 2015-08-19 DIAGNOSIS — E039 Hypothyroidism, unspecified: Secondary | ICD-10-CM | POA: Insufficient documentation

## 2015-08-19 DIAGNOSIS — E1122 Type 2 diabetes mellitus with diabetic chronic kidney disease: Secondary | ICD-10-CM | POA: Insufficient documentation

## 2015-08-19 DIAGNOSIS — E1165 Type 2 diabetes mellitus with hyperglycemia: Secondary | ICD-10-CM | POA: Diagnosis present

## 2015-08-19 DIAGNOSIS — Z8542 Personal history of malignant neoplasm of other parts of uterus: Secondary | ICD-10-CM | POA: Insufficient documentation

## 2015-08-19 DIAGNOSIS — Z79899 Other long term (current) drug therapy: Secondary | ICD-10-CM | POA: Diagnosis not present

## 2015-08-19 DIAGNOSIS — Z7984 Long term (current) use of oral hypoglycemic drugs: Secondary | ICD-10-CM | POA: Insufficient documentation

## 2015-08-19 DIAGNOSIS — J45909 Unspecified asthma, uncomplicated: Secondary | ICD-10-CM | POA: Diagnosis not present

## 2015-08-19 DIAGNOSIS — Z7982 Long term (current) use of aspirin: Secondary | ICD-10-CM | POA: Insufficient documentation

## 2015-08-19 DIAGNOSIS — H538 Other visual disturbances: Secondary | ICD-10-CM

## 2015-08-19 LAB — BASIC METABOLIC PANEL
Anion gap: 11 (ref 5–15)
BUN: 15 mg/dL (ref 6–20)
CO2: 25 mmol/L (ref 22–32)
Calcium: 9.4 mg/dL (ref 8.9–10.3)
Chloride: 99 mmol/L — ABNORMAL LOW (ref 101–111)
Creatinine, Ser: 1.09 mg/dL — ABNORMAL HIGH (ref 0.44–1.00)
GFR calc Af Amer: >60 mL/min (ref >60)
GFR calc non Af Amer: 57 mL/min — ABNORMAL LOW (ref >60)
Glucose, Bld: 278 mg/dL — ABNORMAL HIGH (ref 65–99)
Potassium: 4.3 mmol/L (ref 3.5–5.1)
Sodium: 135 mmol/L (ref 135–145)

## 2015-08-19 LAB — URINALYSIS COMPLETE WITH MICROSCOPIC (ARMC ONLY)
Bacteria, UA: NONE SEEN
Bilirubin Urine: NEGATIVE
Glucose, UA: NEGATIVE mg/dL
Hgb urine dipstick: NEGATIVE
Ketones, ur: NEGATIVE mg/dL
Leukocytes, UA: NEGATIVE
Nitrite: NEGATIVE
Protein, ur: NEGATIVE mg/dL
Specific Gravity, Urine: 1.012 (ref 1.005–1.030)
WBC, UA: NONE SEEN WBC/hpf (ref 0–5)
pH: 5 (ref 5.0–8.0)

## 2015-08-19 LAB — CBC
HCT: 44.8 % (ref 35.0–47.0)
Hemoglobin: 15.8 g/dL (ref 12.0–16.0)
MCH: 34.8 pg — ABNORMAL HIGH (ref 26.0–34.0)
MCHC: 35.4 g/dL (ref 32.0–36.0)
MCV: 98.3 fL (ref 80.0–100.0)
Platelets: 118 10*3/uL — ABNORMAL LOW (ref 150–440)
RBC: 4.56 MIL/uL (ref 3.80–5.20)
RDW: 13.2 % (ref 11.5–14.5)
WBC: 7.5 10*3/uL (ref 3.6–11.0)

## 2015-08-19 LAB — HEPATIC FUNCTION PANEL
ALT: 82 U/L — ABNORMAL HIGH (ref 14–54)
AST: 65 U/L — ABNORMAL HIGH (ref 15–41)
Albumin: 4.8 g/dL (ref 3.5–5.0)
Alkaline Phosphatase: 116 U/L (ref 38–126)
Bilirubin, Direct: 0.2 mg/dL (ref 0.1–0.5)
Indirect Bilirubin: 0.5 mg/dL (ref 0.3–0.9)
Total Bilirubin: 0.7 mg/dL (ref 0.3–1.2)
Total Protein: 8 g/dL (ref 6.5–8.1)

## 2015-08-19 LAB — LIPASE, BLOOD: Lipase: 32 U/L (ref 11–51)

## 2015-08-19 LAB — AMMONIA: Ammonia: 46 umol/L — ABNORMAL HIGH (ref 9–35)

## 2015-08-19 NOTE — ED Notes (Signed)
Pt reports that her doctor told her to come to the er - pt states she is having problems with blood sugar over 200 up to 350 on a consistent basis - pt reports that her medications are not controlling her blood sugar - pt reports anxiety attacks and lower back pain - spouse states that pt had gallbladder removed and bile duct "got nicked" (July 2016) and ever since she has abd/back pain

## 2015-08-19 NOTE — ED Notes (Signed)
Hepatic level and lipase added on

## 2015-08-19 NOTE — ED Triage Notes (Signed)
Pt arrived to ED reporting having had difficulty controlling blood glucose for the past month. Pt reports PCP told her to come to closest facility. Pt reports BG has been in the 200s on and off for the past month. Pt verbalized she has been having nausea and vomiting with blurred vision and increased lethargy over the past month. Pt report PCP recently took her off Glipizide and placed on Metformin 500 mg without success.

## 2015-08-19 NOTE — ED Provider Notes (Signed)
[facility] Emergency Department Provider Note   ____________________________________________   First MD Initiated Contact with Patient 08/19/15 2100     (approximate)  I have reviewed the triage vital signs and the nursing notes.   HISTORY  Chief Complaint Hyperglycemia   HPI Lori Mckenzie is a 50 y.o. female with a history of ascites as well as [name] and diabetes who is presenting elevated blood sugars. She is concerned because she was recently switched from glipizide and metformin, 500 mg twice a day. Says her sugars have been between 203 50. She says that she is also had increasing episodes of confusion and is concerned that she may have worsening of her pneumonia and hepatic and cephalopathy. She did of her recent admission for this at [facility] on July 20. She denies any burning with urination. She says that she does have neuropathy in her feet which is chronic. Says that a medical problem started one half years ago after she had a bile duct injury during a gallbladder surgery. She says that she has had liver disease since then and is on rifaximin as well as spironolactone. She is concerned about her pneumonia and is asking for ammonia level to be checked. She is primary care [facility]. Says that she has chronic abdominal as well as back pain which is unchanged.Says that she is also had intermittent vomiting over the past month but is requesting ice at this time. Past Medical History:  Diagnosis Date  . Anxiety   . Ascites   . Asthma   . Back pain   . Brittle bone disease   . Cancer ([place])    Uteriine  ca 32yr ago partial hysterectomy  . Cervical disc disease   . Chronic kidney disease   . Collagen vascular disease ([place]    RA  3-4 yrs ago  . COPD (chronic obstructive pulmonary disease) ([place]   . Diabetes mellitus without complication ([place]   . GERD (gastroesophageal reflux disease)   . Hypertension   . Hypothyroidism   . Migraines   . NASH (nonalcoholic  steatohepatitis)   . Respiratory infection    2/17  . Sleep apnea   . Sleep apnea   . Thyroid disease   . TIA (transient ischemic attack)     Patient Active Problem List   Diagnosis Date Noted  . Radicular pain of thoracic region 04/06/2015  . Pain management 03/31/2015  . Hepatic encephalopathy ([place] 03/11/2015  . Elevated sedimentation rate 03/05/2015  . Elevated C-reactive protein (CRP) 03/05/2015  . Lumbar facet syndrome (Location of Primary Source of Pain) (Bilateral) (R>L) 03/05/2015  . Cervical spondylosis 03/05/2015  . Chronic feet pain (Location of Secondary source of pain) (Bilateral) (R>L) 03/05/2015  . Lumbar spondylosis 03/05/2015  . Encounter for chronic pain management 03/05/2015  . Chronic shoulder pain (Bilateral) (R>L) 03/05/2015  . Chronic carpal tunnel syndrome (Bilateral) 03/05/2015  . Chronic hip pain (Bilateral) (L>R) 03/05/2015  . Chronic upper back pain (Bilateral) (L>R) 03/05/2015  . Osteoporosis, idiopathic 03/05/2015  . Abnormal MRI, lumbar spine (02/03/2015) 03/05/2015  . Thoracic radiculitis 02/05/2015  . Vitamin D deficiency 01/13/2015  . B12 deficiency 01/13/2015  . Folate deficiency 01/13/2015  . Subacute lumbar radiculopathy (left side) (S1 dermatome) 12/10/2014  . Fall 11/16/2014  . Drowsiness 11/16/2014  . Episode of syncope 11/16/2014  . Chronic pain 10/28/2014  . Opiate use (75 MME/Day) 10/28/2014  . Long term prescription opiate use 10/28/2014  . Long term current use of opiate analgesic 10/28/2014  .  Encounter for therapeutic drug level monitoring 10/28/2014  . Chronic epigastric abdominal pain 10/28/2014  . Chronic low back pain (Location of Primary Source of Pain) (Bilateral) (R>L) 10/28/2014  . Chronic neck pain (Location of Tertiary source of pain) (Bilateral) (R>L) 10/28/2014  . Sleep apnea 10/28/2014  . Pyrexia   . Chronic obstructive pulmonary disease ([place]) 09/10/2014  . Ascites 09/05/2014  . NASH (nonalcoholic  steatohepatitis) 09/05/2014  . Hypokalemia 09/05/2014  . Abdominal abscess ([place]) 08/25/2014  . Dysthymia 08/05/2014  . Other social stressor 08/05/2014  . Bile leak, postoperative 07/17/2014  . Steatohepatitis 07/15/2014  . Type 2 diabetes mellitus ([place]) 07/12/2014  . COPD (chronic obstructive pulmonary disease) ([place]) 07/12/2014  . HTN (hypertension) 07/12/2014  . GERD (gastroesophageal reflux disease) 07/12/2014  . OSA on CPAP 07/12/2014  . Anxiety 07/12/2014  . 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 Lumbar and sacral osteoarthritis 03/26/2014  . Myofascial pain 03/26/2014  . Breath shortness 01/09/2014  . Diastolic dysfunction 23/30/0762  . Airway hyperreactivity 08/06/2013  . Essential (primary) hypertension 08/06/2013  . Clinical depression 12/05/2011  . Major depressive disorder with single episode ([place]) 12/05/2011  . Anxiety state 08/10/2010  . Acid reflux 08/10/2010    Past Surgical History:  Procedure Laterality Date  . ABDOMINAL HYSTERECTOMY    . CHOLECYSTECTOMY N/A 07/15/2014   Procedure: LAPAROSCOPIC CHOLECYSTECTOMY with liver biopsy ;  Surgeon: Sherri Rad, MD;  Location: [facility] ORS;  Service: General;  Laterality: N/A;  . COLONOSCOPY WITH PROPOFOL N/A 06/23/2014   Procedure: COLONOSCOPY WITH PROPOFOL;  Surgeon: Lollie Sails, MD;  Location: [facility] ENDOSCOPY;  Service: Endoscopy;  Laterality: N/A;  . ERCP N/A 07/16/2014   Procedure: ENDOSCOPIC RETROGRADE CHOLANGIOPANCREATOGRAPHY (ERCP);  Surgeon: Clarene Essex, MD;  Location: [facility] ENDOSCOPY;  Service: Endoscopy;  Laterality: N/A;  . ERCP N/A 10/03/2014   Procedure: ENDOSCOPIC RETROGRADE CHOLANGIOPANCREATOGRAPHY (ERCP);  Surgeon: Hulen Luster, MD;  Location: [facility] ENDOSCOPY;  Service: Gastroenterology;  Laterality: N/A;  . ESOPHAGOGASTRODUODENOSCOPY N/A 06/23/2014   Procedure: ESOPHAGOGASTRODUODENOSCOPY (EGD);  Surgeon: Lollie Sails, MD;  Location: [facility] ENDOSCOPY;  Service: Endoscopy;  Laterality:  N/A;  . TUBAL LIGATION    . WISDOM TOOTH EXTRACTION      Prior to Admission medications   Medication Sig Start Date End Date Taking? Authorizing Provider  albuterol (PROVENTIL HFA;VENTOLIN HFA) 108 (90 BASE) MCG/ACT inhaler Inhale 2 puffs into the lungs every 4 (four) hours as needed for wheezing or shortness of breath.    Historical Provider, MD  ALPRAZolam Duanne Moron) 1 MG tablet Take 1-2 mg by mouth 3 (three) times daily as needed for anxiety.    Historical Provider, MD  aspirin EC 81 MG tablet Take 81 mg by mouth daily.     Historical Provider, MD  dicyclomine (BENTYL) 10 MG capsule Take 10 mg by mouth 4 (four) times daily -  before meals and at bedtime.     Historical Provider, MD  famotidine (PEPCID) 20 MG tablet Take 20 mg by mouth daily.     Historical Provider, MD  fluticasone (FLONASE) 50 MCG/ACT nasal spray Place 1-2 sprays into both nostrils daily as needed for rhinitis.     Historical Provider, MD  Folate-B12-Intrinsic Factor (INTRINSI B12-FOLATE) 263-335-45 MCG-MCG-MG TABS Take 1 tablet by mouth daily. Reported on 04/06/2015    Historical Provider, MD  furosemide (LASIX) 40 MG tablet Take 40 mg by mouth daily.     Historical Provider, MD  gabapentin (NEURONTIN) 300 MG capsule Take 900 mg by mouth  4 (four) times daily as needed (for pain).     Historical Provider, MD  glipiZIDE (GLUCOTROL) 5 MG tablet Take 5 mg by mouth daily before breakfast.    Historical Provider, MD  hydrochlorothiazide (HYDRODIURIL) 25 MG tablet Take 25 mg by mouth at bedtime.     Historical Provider, MD  lactulose (CHRONULAC) 10 GM/15ML solution Take by mouth. 03/13/15   Historical Provider, MD  naloxone Karma Greaser) 2 MG/2ML injection Inject into the vein as needed.    Historical Provider, MD  nystatin (MYCOSTATIN) powder Apply 1 g topically 3 (three) times daily as needed (for irritation).     Historical Provider, MD  omeprazole (PRILOSEC) 20 MG capsule Take 20 mg by mouth 2 (two) times daily before a meal.     Historical Provider, MD  oxyCODONE-acetaminophen (PERCOCET) 10-325 MG tablet Take 1 tablet by mouth 5 (five) times daily. 06/29/15   Milinda Pointer, MD  oxyCODONE-acetaminophen (PERCOCET) 10-325 MG tablet Take 1 tablet by mouth 5 (five) times daily. 06/29/15   Milinda Pointer, MD  oxyCODONE-acetaminophen (PERCOCET) 10-325 MG tablet Take 1 tablet by mouth 5 (five) times daily. 06/29/15   Milinda Pointer, MD  promethazine (PHENERGAN) 12.5 MG tablet Take 12.5 mg by mouth every 6 (six) hours as needed for nausea or vomiting.     Historical Provider, MD  rifaximin (XIFAXAN) 550 MG TABS tablet Take 550 mg by mouth 2 (two) times daily.     Historical Provider, MD  rizatriptan (MAXALT) 10 MG tablet Take 10 mg by mouth as needed for migraine.     Historical Provider, MD  spironolactone (ALDACTONE) 100 MG tablet Take 100 mg by mouth daily.     Historical Provider, MD  vitamin B-12 (CYANOCOBALAMIN) 1000 MCG tablet Take 2,000 mcg by mouth daily.    Historical Provider, MD    Allergies Tape and Vicodin [hydrocodone-acetaminophen]  Family History  Problem Relation Age of Onset  . Lung cancer Father   . Ulcers Father   . Heart disease Sister   . Ulcers Sister   . Heart disease Brother     Social History Social History  Substance Use Topics  . Smoking status: Never Smoker  . Smokeless tobacco: Never Used  . Alcohol use No     Comment: occ    Review of Systems Constitutional: No fever/chills Eyes:He is also saying that she has had blurred vision over the past 2 weeks. Says the blurred vision is bilaterally. ENT: No sore throat. Cardiovascular: Denies chest pain. Respiratory: Denies shortness of breath. Gastrointestinal:  No diarrhea.  No constipation. Genitourinary: Negative for dysuria. Musculoskeletal: Negative for back pain. Skin: Negative for rash. Neurological: Negative for headaches, focal weakness or numbness.  10-point ROS otherwise  negative.  ____________________________________________   PHYSICAL EXAM:  VITAL SIGNS: ED Triage Vitals  Enc Vitals Group     BP 08/19/15 1727 118/68     Pulse Rate 08/19/15 1727 (!) 118     Resp 08/19/15 1727 16     Temp 08/19/15 1727 98.7 F (37.1 C)     Temp Source 08/19/15 1727 Oral     SpO2 08/19/15 1727 95 %     Weight 08/19/15 1728 250 lb (113.4 kg)     Height 08/19/15 1728 5' 2"  (1.575 m)     Head Circumference --      Peak Flow --      Pain Score 08/19/15 1728 9     Pain Loc --      Pain Edu? --  Excl. in [place]? --     Constitutional: Alert and oriented. Well appearing and in no acute distress. Eyes: Conjunctivae are normal. PERRL. EOMI. 20/70 bilaterally. Head: Atraumatic. Nose: No congestion/rhinnorhea. Mouth/Throat: Mucous membranes are moist.   Neck: No stridor.   Cardiovascular: Normal rate, regular rhythm. Grossly normal heart sounds.  Good peripheral circulation with equal and bilateral dorsalis pedis pulses. Respiratory: Normal respiratory effort.  No retractions. Lungs CTAB. Gastrointestinal: Soft right upper and left lower quadrant tenderness palpation which the patient says is chronic. The tenderness is moderate. No distention. No CVA tenderness. Musculoskeletal: Moderate bilateral lower extremity edema which is equal bilaterally.  No joint effusions. Neurologic:  Normal speech and language. No gross focal neurologic deficits are appreciated. no asterixis. Skin:  Skin is warm, dry and intact. No rash noted. Psychiatric: Mood and affect are normal. Speech and behavior are normal.  ____________________________________________   LABS (all labs ordered are listed, but only abnormal results are displayed)  Labs Reviewed  BASIC METABOLIC PANEL - Abnormal; Notable for the following:       Result Value   Chloride 99 (*)    Glucose, Bld 278 (*)    Creatinine, Ser 1.09 (*)    GFR calc non Af Amer 57 (*)    All other components within normal limits  CBC  - Abnormal; Notable for the following:    MCH 34.8 (*)    Platelets 118 (*)    All other components within normal limits  URINALYSIS COMPLETEWITH MICROSCOPIC ([facility] ONLY) - Abnormal; Notable for the following:    Color, Urine YELLOW (*)    APPearance CLEAR (*)    Squamous Epithelial / LPF 0-5 (*)    All other components within normal limits  AMMONIA - Abnormal; Notable for the following:    Ammonia 46 (*)    All other components within normal limits  HEPATIC FUNCTION PANEL - Abnormal; Notable for the following:    AST 65 (*)    ALT 82 (*)    All other components within normal limits  LIPASE, BLOOD  CBG MONITORING, ED   ____________________________________________  EKG   ____________________________________________  RADIOLOGY   ____________________________________________   PROCEDURES  Procedure(s) performed:   Procedures  Critical Care performed:   ____________________________________________   INITIAL IMPRESSION / ASSESSMENT AND PLAN / ED COURSE  Pertinent labs & imaging results that were available during my care of the patient were reviewed by me and considered in my medical decision making (see chart for details).  ----------------------------------------- 11:32 PM on 08/19/2015 -----------------------------------------  Patient is resting comfortably and was able to ambulate with a normal gait without any assistance. She has lab use that are consistent with her baseline lab values. I told her that we would be able to give her a dose of lactulose prior to discharge which she said she was rather take her evening dose at home. She says that she is a follow-up with the ophthalmologist on the 23rd of the month. She says that she was originally sent to the hospital because of high blood sugars but her sugar was only 278 year without any signs of diabetic ketoacidosis. Because of the patient's already existing liver insufficiency do not want to raise her metformin. I  believe it is most safe for her to follow up with her primary care doctor at [facility] for further guidance to change her ongoing medications. She is understanding of this plan and willing to comply. She has good insight into her medical condition and has  decisional capacity. She'll be discharged home.  Clinical Course     ____________________________________________   FINAL CLINICAL IMPRESSION(S) / ED DIAGNOSES  Hyperglycemia. Blurred vision. Chronic abdominal pain.    NEW MEDICATIONS STARTED DURING THIS VISIT:  New Prescriptions   No medications on file     Note:  This document was prepared using Dragon voice recognition software and may include unintentional dictation errors.    Orbie Pyo, MD 08/19/15 [phone]

## 2015-09-02 ENCOUNTER — Telehealth: Payer: Self-pay | Admitting: *Deleted

## 2015-09-02 NOTE — Telephone Encounter (Signed)
At pharmacy states that patient needs a prior auth for oxycodone.  States he sent information today by fax.

## 2015-09-06 MED ORDER — OXYCODONE-ACETAMINOPHEN 5-325 MG PO TABS
ORAL_TABLET | ORAL | Status: AC
  Filled 2015-09-06: qty 2

## 2015-09-24 DIAGNOSIS — R188 Other ascites: Secondary | ICD-10-CM | POA: Insufficient documentation

## 2015-09-24 DIAGNOSIS — K7460 Unspecified cirrhosis of liver: Secondary | ICD-10-CM | POA: Insufficient documentation

## 2015-09-28 ENCOUNTER — Encounter: Payer: Self-pay | Admitting: Pain Medicine

## 2015-09-28 ENCOUNTER — Ambulatory Visit: Payer: Medicaid Other | Attending: Pain Medicine | Admitting: Pain Medicine

## 2015-09-28 VITALS — BP 120/86 | HR 108 | Temp 98.1°F | Resp 16 | Ht 62.0 in | Wt 250.0 lb

## 2015-09-28 DIAGNOSIS — M545 Low back pain, unspecified: Secondary | ICD-10-CM

## 2015-09-28 DIAGNOSIS — K7682 Hepatic encephalopathy: Secondary | ICD-10-CM

## 2015-09-28 DIAGNOSIS — G8929 Other chronic pain: Secondary | ICD-10-CM

## 2015-09-28 DIAGNOSIS — Z79891 Long term (current) use of opiate analgesic: Secondary | ICD-10-CM

## 2015-09-28 DIAGNOSIS — K7290 Hepatic failure, unspecified without coma: Secondary | ICD-10-CM

## 2015-09-28 DIAGNOSIS — F1190 Opioid use, unspecified, uncomplicated: Secondary | ICD-10-CM | POA: Diagnosis not present

## 2015-09-28 DIAGNOSIS — K7030 Alcoholic cirrhosis of liver without ascites: Secondary | ICD-10-CM

## 2015-09-28 DIAGNOSIS — M47816 Spondylosis without myelopathy or radiculopathy, lumbar region: Secondary | ICD-10-CM

## 2015-09-28 DIAGNOSIS — M79673 Pain in unspecified foot: Secondary | ICD-10-CM

## 2015-09-28 DIAGNOSIS — M542 Cervicalgia: Secondary | ICD-10-CM

## 2015-09-28 DIAGNOSIS — R748 Abnormal levels of other serum enzymes: Secondary | ICD-10-CM

## 2015-09-28 MED ORDER — OXYCODONE HCL 10 MG PO TABS: 10.0000 mg | ORAL_TABLET | Freq: Every day | ORAL | 0 refills | Status: DC | PRN

## 2015-09-28 NOTE — Progress Notes (Signed)
Safety precautions to be maintained throughout the outpatient stay will include: orient to surroundings, keep bed in low position, maintain call bell within reach at all times, provide assistance with transfer out of bed and ambulation. Patient did not bring pill bottles. Says " I think I have 6-7 pills left."

## 2015-09-28 NOTE — Progress Notes (Signed)
Patient's Name: Lori Mckenzie  MRN: [id number]  Referring Provider: Ardine Eng, MD  DOB: 21-Oct-1965  PCP: Ardine Eng, MD  DOS: 09/28/2015  Note by: Kathlen Brunswick. Dossie Arbour, MD  Service setting: Ambulatory outpatient  Specialty: Interventional Pain Management  Location: [facility] (AMB) Pain Management Facility    Patient type: Established   Primary Reason(s) for Visit: Encounter for prescription drug management (Level of risk: moderate) CC: Back Pain (all over the back, starts mid to lower and then mid to upper back) and Neck Pain (both sides)  HPI  [name] is a 50 y.o. year old, female patient, who comes today for an initial evaluation. She has Type 2 diabetes mellitus ([place]); COPD (chronic obstructive pulmonary disease) (HCC); HTN (hypertension); GERD (gastroesophageal reflux disease); OSA on CPAP; Anxiety; Steatohepatitis; Bile leak, postoperative; Dysthymia; Other social stressor; Abdominal abscess ([place]); Ascites; NASH (nonalcoholic steatohepatitis); Hypokalemia; Pyrexia; Chronic pain; Opiate use (75 MME/Day); Long term prescription opiate use; Long term current use of opiate analgesic; Encounter for therapeutic drug level monitoring; Chronic epigastric abdominal pain; Chronic low back pain (Location of Primary Source of Pain) (Bilateral) (R>L); Chronic neck pain (Location of Tertiary source of pain) (Bilateral) (R>L); Breath shortness; Diastolic dysfunction; Clinical depression; Chronic obstructive pulmonary disease ([place]); Airway hyperreactivity; Anxiety state; Acid reflux; Essential (primary) hypertension; 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, L1-2, & L4-5); Lumbar and sacral osteoarthritis; Myofascial pain; Sleep apnea; Fall; Drowsiness; Episode of syncope; Subacute lumbar radiculopathy (left side) (S1 dermatome); Vitamin D deficiency; B12 deficiency; Folate deficiency; Thoracic radiculitis; Major depressive disorder with single episode ([place]); Elevated sedimentation rate; Elevated C-reactive protein  (CRP); Lumbar facet syndrome (Location of Primary Source of Pain) (Bilateral) (R>L); Cervical spondylosis; Chronic feet pain (Location of Secondary source of pain) (Bilateral) (R>L); Lumbar spondylosis; Encounter for chronic pain management; Chronic shoulder pain (Bilateral) (R>L); Chronic carpal tunnel syndrome (Bilateral); Chronic hip pain (Bilateral) (L>R); Chronic upper back pain (Bilateral) (L>R); Osteoporosis, idiopathic; Abnormal MRI, lumbar spine (02/03/2015); Hepatic encephalopathy ([place]); Pain management; Radicular pain of thoracic region; Altered mental status; Cirrhosis of liver without ascites ([place]); and Elevated liver enzymes on her problem list.. Her primarily concern today is the Back Pain (all over the back, starts mid to lower and then mid to upper back) and Neck Pain (both sides)  Pain Assessment: Self-Reported Pain Score: 8 /10 Clinically the patient looks like a 3/10 Reported level is inconsistent with clinical observations. Information on the proper use of the pain score provided to the patient today. Pain Type: Chronic pain Pain Location: Back Pain Orientation: Mid, Lower, Upper Pain Descriptors / Indicators: Aching, Constant, Sharp (when moving pain is worse) Pain Frequency: Constant  The patient comes into the clinics today for pharmacological management of her chronic pain. I last saw this patient on 06/29/2015. The patient  reports that she does not use drugs. Her body mass index is 45.73 kg/m.  Date of Last Visit: 06/29/15 Service Provided on Last Visit: Med Refill  Controlled Substance Pharmacotherapy Assessment & REMS (Risk Evaluation and Mitigation Strategy)  Analgesic: Oxycodone/APAP 10/325 5 tablets per day (50 mg/day) MME/day: 75 mg/day.   Pill Count: Patient did not bring pill bottles. Says " I think I have 6-7 pills left." Patient informed that this is a requirement and that the next time that she forgets to bring the pills, we will not be refilling her  medications. Pharmacokinetics: Onset of action (Liberation/Absorption): Within expected pharmacological parameters Time to Peak effect (Distribution): Timing and results are as within normal expected parameters Duration of  action (Metabolism/Excretion): Within normal limits for medication Pharmacodynamics: Analgesic Effect: More than 50% Activity Facilitation: Medication(s) allow patient to sit, stand, walk, and do the basic ADLs Perceived Effectiveness: Described as relatively effective, allowing for increase in activities of daily living (ADL) Side-effects or Adverse reactions: None reported Monitoring: [place] PMP: Online review of the past 86-monthperiod conducted. Compliant with practice rules and regulations List of all UDS test(s) done:  Lab Results  Component Value Date   TOXASSSELUR FINAL 03/05/2015   TOXASSSELUR FINAL 02/05/2015   TJoffreFINAL 01/08/2015   Last UDS on record: ToxAssure Select 13  Date Value Ref Range Status  03/05/2015 FINAL  Final    Comment:    ==================================================================== TOXASSURE SELECT 13 (MW) ==================================================================== Test                             Result       Flag       Units Drug Present and Declared for Prescription Verification   Alprazolam                     735          EXPECTED   ng/mg creat   Alpha-hydroxyalprazolam        1088         EXPECTED   ng/mg creat    Source of alprazolam is a scheduled prescription medication.    Alpha-hydroxyalprazolam is an expected metabolite of alprazolam.   Oxycodone                      4063         EXPECTED   ng/mg creat   Noroxycodone                   6686         EXPECTED   ng/mg creat    Sources of oxycodone include scheduled prescription medications.    Noroxycodone is an expected metabolite of oxycodone. ==================================================================== Test                      Result     Flag   Units      Ref Range   Creatinine              51               mg/dL      >=20 ==================================================================== Declared Medications:  The flagging and interpretation on this report are based on the  following declared medications.  Unexpected results may arise from  inaccuracies in the declared medications.  **Note: The testing scope of this panel includes these medications:  Alprazolam (Xanax)  Oxycodone (Percocet)  **Note: The testing scope of this panel does not include following  reported medications:  Acetaminophen (Percocet)  Albuterol (Proventil)  Aspirin (Aspirin 81)  Citalopram (Celexa)  Cyanocobalamin  Dicyclomine (Bentyl)  Famotidine (Pepcid)  Fluticasone (Flonase)  Furosemide (Lasix)  Gabapentin (Neurontin)  Hydrochlorothiazide (Hydrodiuril)  Lactulose  Loperamide (Imodium)  Metformin (Glucophage)  Nystatin (Mycostatin)  Omeprazole (Prilosec)  Ondansetron (Zofran)  Promethazine (Phenergan)  Rifaximin (Xifaxan)  Rizatriptan (Maxalt)  Spironolactone (Aldactone)  Supplement ==================================================================== For clinical consultation, please call [phone] ====================================================================    UDS interpretation: Compliant          Medication Assessment Form: Reviewed. Patient indicates being compliant with therapy Treatment compliance: Compliant Risk Assessment: Aberrant Behavior: None observed  today Substance Use Disorder (SUD) Risk Level: No change since last visit Risk of opioid abuse or dependence: 0.7-3.0% with doses ? 36 MME/day and 6.1-26% with doses ? 120 MME/day. Opioid Risk Tool (ORT) Score: 1   Low Risk for SUD (Score <3) Depression Scale Score: PHQ-2: 0   No depression (0) PHQ-9: 0   No depression (0-4)  Pharmacologic Plan: No change in therapy, at this time  Laboratory Chemistry  Inflammation Markers Lab Results   Component Value Date   ESRSEDRATE 35 (H) 01/08/2015   CRP 2.8 (H) 01/08/2015   Renal Function Lab Results  Component Value Date   BUN 15 08/19/2015   CREATININE 1.09 (H) 08/19/2015   GFRAA >60 08/19/2015   GFRNONAA 57 (L) 08/19/2015   Hepatic Function Lab Results  Component Value Date   AST 65 (H) 08/19/2015   ALT 82 (H) 08/19/2015   ALBUMIN 4.8 08/19/2015   Electrolytes Lab Results  Component Value Date   NA 135 08/19/2015   K 4.3 08/19/2015   CL 99 (L) 08/19/2015   CALCIUM 9.4 08/19/2015   MG 1.9 01/08/2015   Pain Modulating Vitamins Lab Results  Component Value Date   VD25OH 14.3 (L) 01/08/2015   VD125OH2TOT 28.8 01/08/2015   VITAMINB12 172 (L) 01/08/2015   Coagulation Parameters Lab Results  Component Value Date   INR 1.16 09/07/2014   LABPROT 15.0 09/07/2014   APTT 24.6 08/26/2013   PLT 118 (L) 08/19/2015   Cardiovascular Lab Results  Component Value Date   BNP 11 12/11/2012   HGB 15.8 08/19/2015   HCT 44.8 08/19/2015    Note: Lab results reviewed.  Recent Diagnostic Imaging  No results found. Meds  The patient has a current medication list which includes the following prescription(s): albuterol, albuterol, alprazolam, aspirin ec, cholecalciferol, citalopram, dicyclomine, fluticasone, intrinsi b12-folate, furosemide, gabapentin, glipizide, hydrochlorothiazide, ipratropium, lactulose, metformin, naloxone, nystatin, ondansetron, pantoprazole, promethazine, rifaximin, rizatriptan, senna, spironolactone, triamcinolone ointment, vitamin b-12, oxycodone hcl, oxycodone hcl, and oxycodone hcl.  Current Outpatient Prescriptions on File Prior to Visit  Medication Sig  . albuterol (PROVENTIL HFA;VENTOLIN HFA) 108 (90 BASE) MCG/ACT inhaler Inhale 2 puffs into the lungs every 4 (four) hours as needed for wheezing or shortness of breath.  . ALPRAZolam (XANAX) 1 MG tablet Take 1-2 mg by mouth 3 (three) times daily as needed for anxiety.  Marland Kitchen aspirin EC 81 MG tablet  Take 81 mg by mouth daily.   Marland Kitchen dicyclomine (BENTYL) 10 MG capsule Take 10 mg by mouth 4 (four) times daily -  before meals and at bedtime.   . fluticasone (FLONASE) 50 MCG/ACT nasal spray Place 1-2 sprays into both nostrils daily as needed for rhinitis.   Derald Macleod Factor (INTRINSI B12-FOLATE) 099-833-82 MCG-MCG-MG TABS Take 1 tablet by mouth daily. Reported on 04/06/2015  . furosemide (LASIX) 40 MG tablet Take 80 mg by mouth daily.   Marland Kitchen gabapentin (NEURONTIN) 300 MG capsule Take 900 mg by mouth 4 (four) times daily as needed (for pain).   Marland Kitchen glipiZIDE (GLUCOTROL) 5 MG tablet Take 5 mg by mouth 2 (two) times daily before a meal.   . hydrochlorothiazide (HYDRODIURIL) 25 MG tablet Take 25 mg by mouth at bedtime.   Marland Kitchen lactulose (CHRONULAC) 10 GM/15ML solution Take 30 g by mouth 3 (three) times daily as needed.   . naloxone [facility]) 2 MG/2ML injection Inject into the vein as needed.  . nystatin (MYCOSTATIN) powder Apply 1 g topically 3 (three) times daily as needed (for irritation).   Marland Kitchen  promethazine (PHENERGAN) 12.5 MG tablet Take 12.5 mg by mouth every 6 (six) hours as needed for nausea or vomiting.   . rifaximin (XIFAXAN) 550 MG TABS tablet Take 550 mg by mouth 2 (two) times daily.   . rizatriptan (MAXALT) 10 MG tablet Take 10 mg by mouth as needed for migraine.   Marland Kitchen spironolactone (ALDACTONE) 100 MG tablet Take 100 mg by mouth daily.   . vitamin B-12 (CYANOCOBALAMIN) 1000 MCG tablet Take 2,000 mcg by mouth daily.  . [DISCONTINUED] SUMAtriptan (IMITREX) 50 MG tablet Take 50 mg by mouth every 2 (two) hours as needed. For migraines   No current facility-administered medications on file prior to visit.    ROS  Constitutional: Denies any fever or chills Gastrointestinal: No reported hemesis, hematochezia, vomiting, or acute GI distress Musculoskeletal: Denies any acute onset joint swelling, redness, loss of ROM, or weakness Neurological: No reported episodes of acute onset apraxia, aphasia,  dysarthria, agnosia, amnesia, paralysis, loss of coordination, or loss of consciousness  Allergies  [name] is allergic to tape and vicodin [hydrocodone-acetaminophen].  [place]  Medical:  [name]  has a past medical history of Anxiety; Ascites; Asthma; Back pain; Brittle bone disease; Cancer ([place]); Cervical disc disease; Chronic kidney disease; Collagen vascular disease ([place]); COPD (chronic obstructive pulmonary disease) ([place]); Diabetes mellitus without complication ([place]); GERD (gastroesophageal reflux disease); Hypertension; Hypothyroidism; Migraines; NASH (nonalcoholic steatohepatitis); Respiratory infection; Sleep apnea; Sleep apnea; Thyroid disease; and TIA (transient ischemic attack). Family: family history includes Heart disease in her brother and sister; Lung cancer in her father; Ulcers in her father and sister. Surgical:  has a past surgical history that includes Abdominal hysterectomy; Tubal ligation; Colonoscopy with propofol (N/A, 06/23/2014); Esophagogastroduodenoscopy (N/A, 06/23/2014); Cholecystectomy (N/A, 07/15/2014); ERCP (N/A, 07/16/2014); Wisdom tooth extraction; and ERCP (N/A, 10/03/2014). Tobacco:  reports that she has never smoked. She has never used smokeless tobacco. Alcohol:  reports that she does not drink alcohol. Drug:  reports that she does not use drugs.  Constitutional Exam  General appearance: Well nourished, well developed, and well hydrated. In no acute distress Vitals:   09/28/15 0933  BP: 120/86  Pulse: (!) 108  Resp: 16  Temp: 98.1 F (36.7 C)  TempSrc: Oral  SpO2: 97%  Weight: 250 lb (113.4 kg)  Height: 5' 2"  (1.575 m)  BMI Assessment: Estimated body mass index is 45.73 kg/m as calculated from the following:   Height as of this encounter: 5' 2"  (1.575 m).   Weight as of this encounter: 250 lb (113.4 kg).   BMI interpretation: (>40 kg/m2) = Extreme obesity (Class III): This range is associated with a 254% higher incidence of chronic pain. BMI  Readings from Last 4 Encounters:  09/28/15 45.73 kg/m  08/19/15 45.73 kg/m  06/29/15 45.73 kg/m  04/06/15 38.78 kg/m   Wt Readings from Last 4 Encounters:  09/28/15 250 lb (113.4 kg)  08/19/15 250 lb (113.4 kg)  06/29/15 250 lb (113.4 kg)  04/06/15 212 lb (96.2 kg)  Psych/Mental status: Alert and oriented x 3 (person, place, & time) Eyes: PERLA Respiratory: No evidence of acute respiratory distress  Cervical Spine Exam  Inspection: No masses, redness, or swelling Alignment: Symmetrical Functional ROM: Unrestricted ROM Stability: No instability detected Muscle strength & Tone: Functionally intact Sensory: Unimpaired Palpation: Non-contributory  Upper Extremity (UE) Exam    Side: Right upper extremity  Side: Left upper extremity  Inspection: No masses, redness, swelling, or asymmetry  Inspection: No masses, redness, swelling, or asymmetry  Functional ROM: Unrestricted ROM  Functional ROM: Unrestricted ROM          Muscle strength & Tone: Functionally intact  Muscle strength & Tone: Functionally intact  Sensory: Unimpaired  Sensory: Unimpaired  Palpation: Non-contributory  Palpation: Non-contributory   Thoracic Spine Exam  Inspection: No masses, redness, or swelling Alignment: Symmetrical Functional ROM: Unrestricted ROM Stability: No instability detected Sensory: Unimpaired Muscle strength & Tone: Functionally intact Palpation: Non-contributory  Lumbar Spine Exam  Inspection: No masses, redness, or swelling Alignment: Symmetrical Functional ROM: Unrestricted ROM Stability: No instability detected Muscle strength & Tone: Functionally intact Sensory: Unimpaired Palpation: Non-contributory Provocative Tests: Lumbar Hyperextension and rotation test: evaluation deferred today       Patrick's Maneuver: evaluation deferred today              Gait & Posture Assessment  Ambulation: Unassisted Gait: Relatively normal for age and body habitus Posture: WNL    Lower Extremity Exam    Side: Right lower extremity  Side: Left lower extremity  Inspection: No masses, redness, swelling, or asymmetry  Inspection: No masses, redness, swelling, or asymmetry  Functional ROM: Unrestricted ROM          Functional ROM: Unrestricted ROM          Muscle strength & Tone: Functionally intact  Muscle strength & Tone: Functionally intact  Sensory: Unimpaired  Sensory: Unimpaired  Palpation: Non-contributory  Palpation: Non-contributory   Assessment  Primary Diagnosis & Pertinent Problem List: The primary encounter diagnosis was Chronic pain. Diagnoses of Long term current use of opiate analgesic, Opiate use (75 MME/Day), Chronic low back pain (Location of Primary Source of Pain) (Bilateral) (R>L), Chronic foot pain, unspecified laterality, Chronic neck pain (Location of Tertiary source of pain) (Bilateral) (R>L), Lumbar facet syndrome (Location of Primary Source of Pain) (Bilateral) (R>L), Alcoholic cirrhosis of liver without ascites (HCC), Hepatic encephalopathy (HCC), and Elevated liver enzymes were also pertinent to this visit.  Visit Diagnosis: 1. Chronic pain   2. Long term current use of opiate analgesic   3. Opiate use (75 MME/Day)   4. Chronic low back pain (Location of Primary Source of Pain) (Bilateral) (R>L)   5. Chronic foot pain, unspecified laterality   6. Chronic neck pain (Location of Tertiary source of pain) (Bilateral) (R>L)   7. Lumbar facet syndrome (Location of Primary Source of Pain) (Bilateral) (R>L)   8. Alcoholic cirrhosis of liver without ascites ([place])   9. Hepatic encephalopathy ([place])   10. Elevated liver enzymes    Plan of Care  Pharmacotherapy (Medications Ordered): Meds ordered this encounter  Medications  . Oxycodone HCl 10 MG TABS    Sig: Take 1 tablet (10 mg total) by mouth 5 (five) times daily as needed.    Dispense:  150 tablet    Refill:  0    Do not add this medication to the electronic "Automatic Refill" notification  system. Patient may have prescription filled one day early if pharmacy is closed on scheduled refill date. Do not fill until: 10/02/15 To last until: 11/01/15  . Oxycodone HCl 10 MG TABS    Sig: Take 1 tablet (10 mg total) by mouth 5 (five) times daily as needed.    Dispense:  150 tablet    Refill:  0    Do not add this medication to the electronic "Automatic Refill" notification system. Patient may have prescription filled one day early if pharmacy is closed on scheduled refill date. Do not fill until: 11/01/15 To last until: 12/01/15  . Oxycodone HCl  10 MG TABS    Sig: Take 1 tablet (10 mg total) by mouth 5 (five) times daily as needed.    Dispense:  150 tablet    Refill:  0    Do not add this medication to the electronic "Automatic Refill" notification system. Patient may have prescription filled one day early if pharmacy is closed on scheduled refill date. Do not fill until: 12/01/15 To last until: 12/31/15   New Prescriptions   OXYCODONE HCL 10 MG TABS    Take 1 tablet (10 mg total) by mouth 5 (five) times daily as needed.   OXYCODONE HCL 10 MG TABS    Take 1 tablet (10 mg total) by mouth 5 (five) times daily as needed.   OXYCODONE HCL 10 MG TABS    Take 1 tablet (10 mg total) by mouth 5 (five) times daily as needed.   Medications administered during this visit: [name] had no medications administered during this visit. Lab-work, Procedure(s), & Referral(s) Ordered: Orders Placed This Encounter  Procedures  . ToxASSURE Select 13 (MW), Urine   Imaging & Referral(s) Ordered: None  Interventional Therapies: Scheduled:  None at this time.    Considering:   1. Diagnostic bilateral lumbar facet block under fluoroscopic guidance and IV sedation.  2. Possible bilateral lumbar facet radiofrequency ablation under fluoroscopic guidance and IV sedation.  3. Diagnostic bilateral cervical facet block under fluoroscopic guidance and IV sedation.  4. Diagnostic L1-2 lumbar epidural  steroid injection under fluoroscopic guidance, with or without sedation.  5. Diagnostic, right-sided L4-5 lumbar epidural steroid injection under fluoroscopic guidance and IV sedation.  6. Diagnostic bilateral intra-articular shoulder joint injection under fluoroscopic guidance, with or without sedation.  7. Diagnostic bilateral suprascapular nerve block under fluoroscopic guidance, with or without sedation.  8. Possible bilateral suprascapular nerve radiofrequency ablation under fluoroscopic guidance and IV sedation.  9. Palliative left-sided L5-S1 lumbar epidural steroid injection under fluoroscopic guidance him a with or without sedation.  10. Diagnostic left S1 selective nerve root block under fluoroscopic guidance, with or without sedation.  11. Diagnostic right-sided cervical epidural steroid injection under fluoroscopic guidance, with or without sedation.  12. Diagnostic T10-11 thoracic epidural steroid injection under fluoroscopic guidance, with or without sedation.  13. Diagnostic bilateral intra-articular hip joint injection under fluoroscopic guidance, with or without sedation.  14. Possible bilateral hip radiofrequency ablation under fluoroscopic guidance and IV sedation.    PRN Procedures:   1. Diagnostic bilateral lumbar facet block under fluoroscopic guidance and IV sedation.  2. Diagnostic bilateral cervical facet block under fluoroscopic guidance and IV sedation.  3. Diagnostic L1-2 lumbar epidural steroid injection under fluoroscopic guidance, with or without sedation.  4. Diagnostic, right-sided L4-5 lumbar epidural steroid injection under fluoroscopic guidance and IV sedation.  5. Diagnostic bilateral intra-articular shoulder joint injection under fluoroscopic guidance, with or without sedation.  6. Diagnostic bilateral suprascapular nerve block under fluoroscopic guidance, with or without sedation.  7. Palliative left-sided L5-S1 lumbar epidural steroid injection under  fluoroscopic guidance him a with or without sedation.  8. Diagnostic left S1 selective nerve root block under fluoroscopic guidance, with or without sedation.  9. Diagnostic right-sided cervical epidural steroid injection under fluoroscopic guidance, with or without sedation.  10. Diagnostic T10-11 thoracic epidural steroid injection under fluoroscopic guidance, with or without sedation.  11. Diagnostic bilateral intra-articular hip joint injection under fluoroscopic guidance, with or without sedation.    Requested PM Follow-up: Return in 3 months (on 12/21/2015) for Med-Mgmt, In addition, (PRN) Procedure.  Future Appointments Date Time Provider [address] [place]  12/21/2015 10:20 AM Milinda Pointer, MD [facility] None   Primary Care Physician: Ardine Eng, MD Location: [facility] Outpatient Pain Management Facility Note by: Kathlen Brunswick. Dossie Arbour, M.D, DABA, DABAPM, DABPM, DABIPP, FIPP  Pain Score Disclaimer: We use the NRS-11 scale. This is a self-reported, subjective measurement of pain severity with only modest accuracy. It is used primarily to identify changes within a particular patient. It must be understood that outpatient pain scales are significantly less accurate that those used for research, where they can be applied under ideal controlled circumstances with minimal exposure to variables. In reality, the score is likely to be a combination of pain intensity and pain affect, where pain affect describes the degree of emotional arousal or changes in action readiness caused by the sensory experience of pain. Factors such as social and work situation, setting, emotional state, anxiety levels, expectation, and prior pain experience may influence pain perception and show large inter-individual differences that may also be affected by time variables.  Patient instructions provided during this appointment: There are no Patient Instructions on file for this visit.

## 2015-10-05 LAB — TOXASSURE SELECT 13 (MW), URINE

## 2015-11-01 NOTE — Progress Notes (Signed)
NOTE: This forensic urine drug screen (UDS) test was conducted using a state-of-the-art ultra high performance liquid chromatography and mass spectrometry system (UPLC/MS-MS), the most sophisticated and accurate method available. UPLC/MS-MS is 1,000 times more precise and accurate than standard gas chromatography and mass spectrometry (GC/MS). This system can analyze 26 drug categories and 180 drug compounds.  Unreported substance: Morphine  The findings of this UDT were reported as abnormal due to inconsistencies with expected results. An unreported substance was identified in the sample. Expectations were based on the medication history provided by the patient at the time of sample collection.  These results may suggest one of the following possibilities:  1). The use of multiple providers, suggesting the illegal practice of "Doctor Shopping", in violation of [place] Statutes, as well as our medication agreement.  2). The use of unsanctioned and possibly illegal substances, in violation of [place] Statutes, as well as our medication agreement. 3). Inaccurate list of reported substances.

## 2015-11-03 ENCOUNTER — Telehealth: Payer: Self-pay | Admitting: Pain Medicine

## 2015-11-03 NOTE — Telephone Encounter (Signed)
Patient had 11 extractions this morning, dentist did not write any meds to help with this,  dentist told her to call Dr. Dossie Arbour for meds. please call patient asap

## 2015-11-03 NOTE — Telephone Encounter (Signed)
Spoke with patients husband re; dental surgery and explained that if additional pain med is needed for this procedure, the dentist would need to prescribe. Also, that in her agreement it is acceptable for her to take additional pain medicine post procedure. Spouse made aware that we have a handout the explains post surgical pain and how it is managed and that we could give her a copy at her next visit.

## 2015-11-04 ENCOUNTER — Telehealth: Payer: Self-pay | Admitting: *Deleted

## 2015-11-04 NOTE — Telephone Encounter (Signed)
Patient's son called and would like to pick up post surgical management for a patient who is chronic pain management. Copy of that left at front desk.

## 2015-11-04 NOTE — Telephone Encounter (Signed)
Spoke with Dr Milford Cage DDS office staff who performed Dental extractions on 11/03/15 and wanted to be sure exactly our policy with them prescribing medications.  Explained that post surgical pain was to be managed by the provider performing procedure, in this case dental extractions. Office staff stated that patient was insistant on having medication prescribed.  Informed exactly what  Dr Dossie Arbour prescribes and that it is completely their call as to what should be prescribed during their care of the patient.

## 2015-11-06 IMAGING — CR DG ABDOMEN 2V
4 series · 4 of 4 positions shown · non-contrast
Comparison: None.

CLINICAL DATA: Right-sided abdominal pain following endoscopy

EXAM:
ABDOMEN - 2 VIEW

[abdomen erect (1 of 2)]
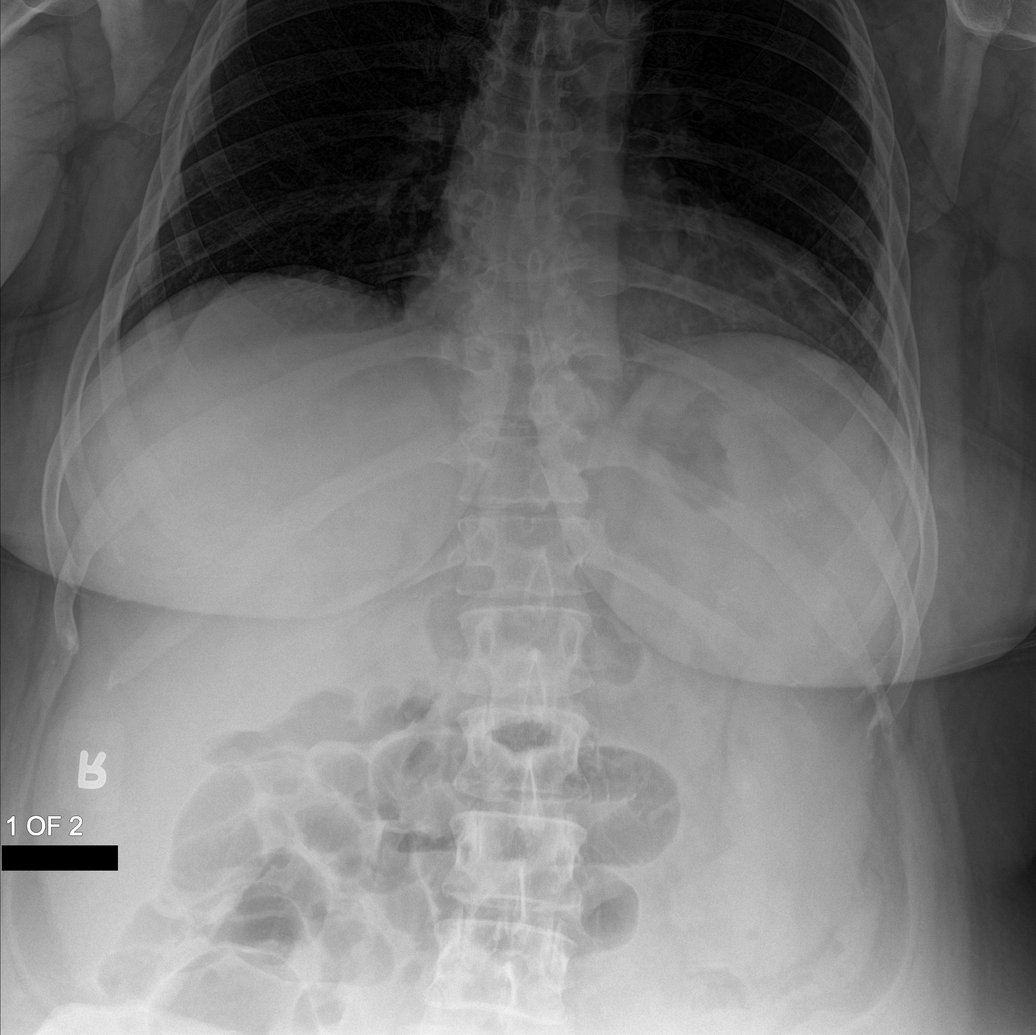

[abdomen erect (2 of 2)]
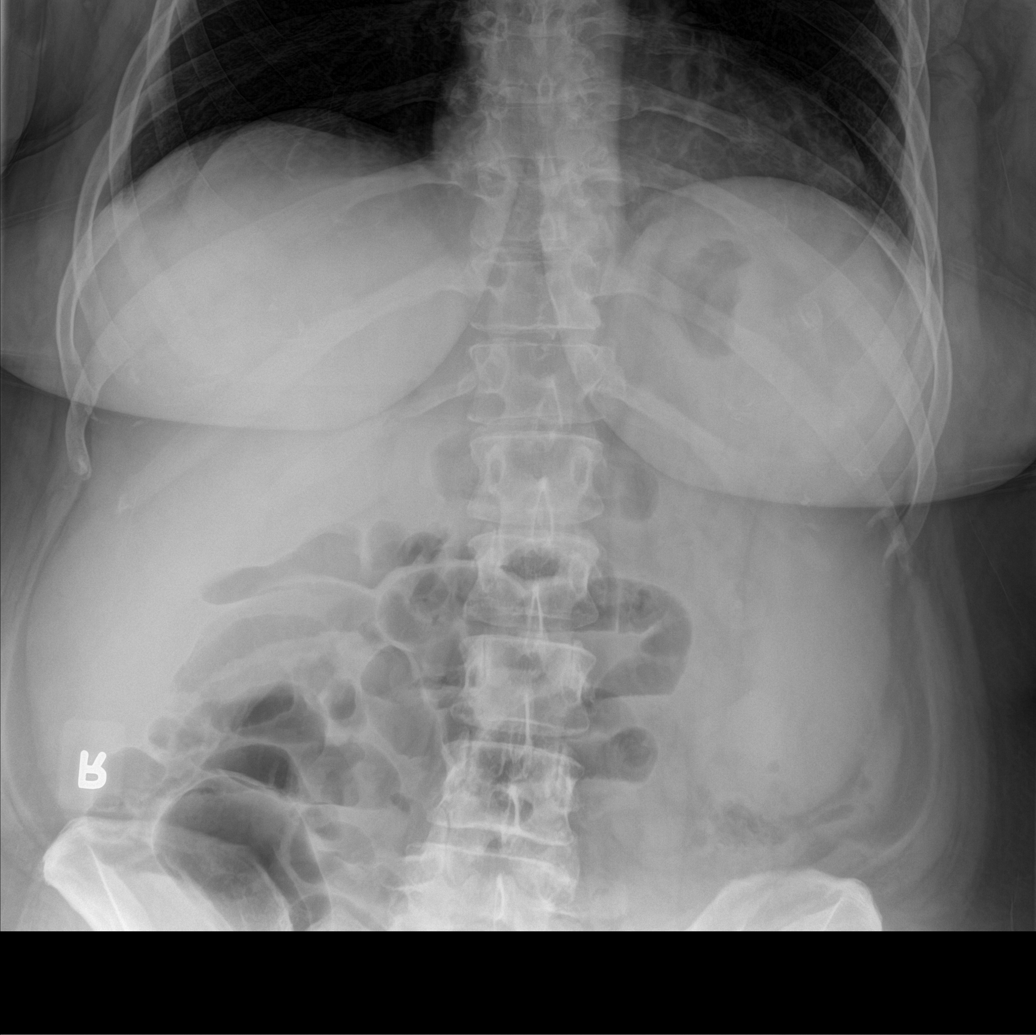

[abdomen supine (1 of 2)]
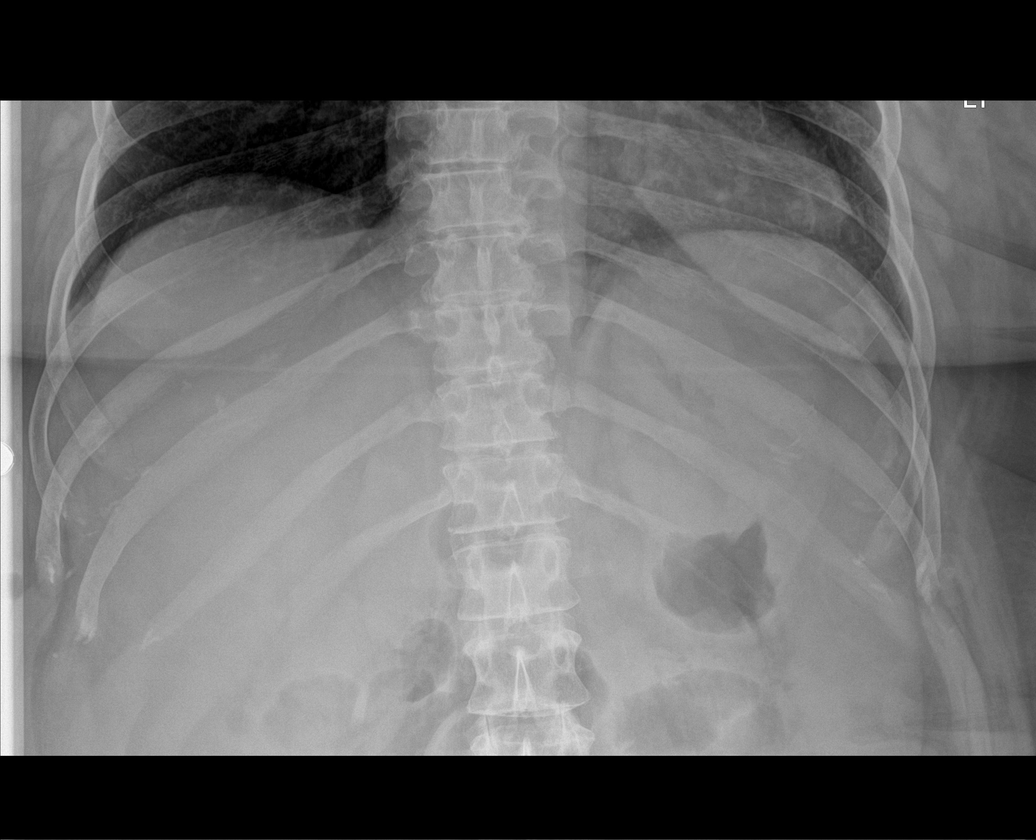

[abdomen supine (2 of 2)]
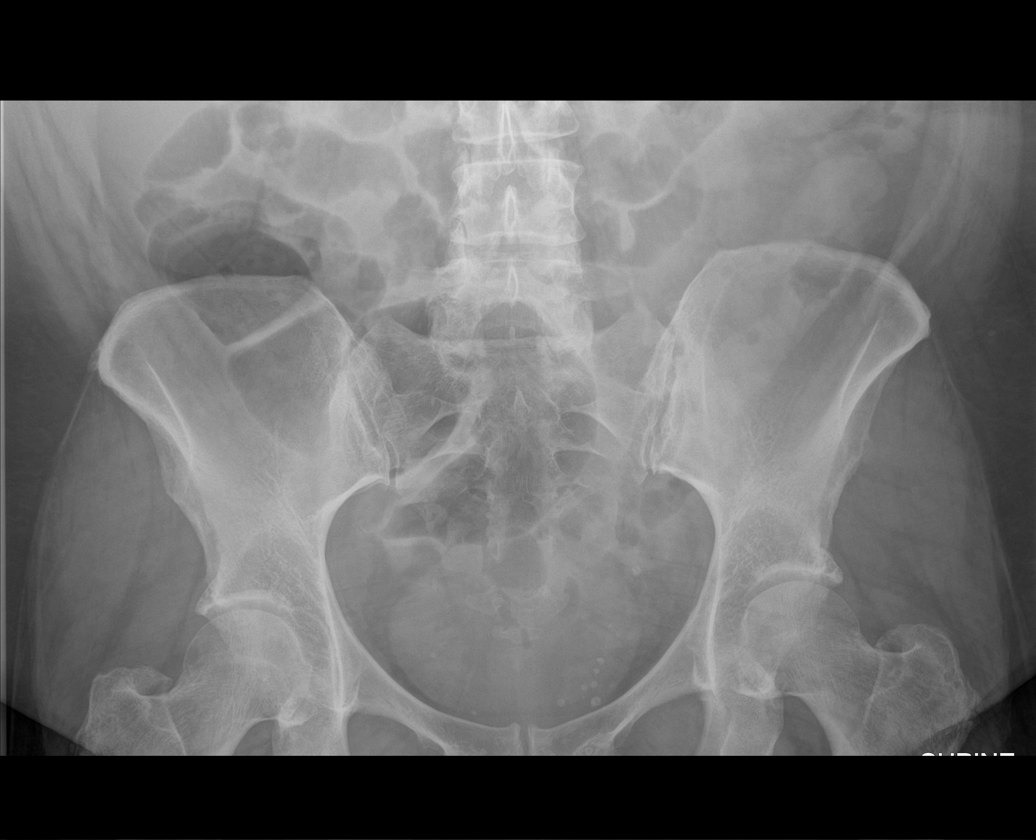

[4 of 4 positions shown; findings below may reference images not displayed]

FINDINGS: No free air is noted. Scattered large and small bowel gas is noted.
No abnormal mass or abnormal calcifications are seen. Elongated
spleen is noted similar to that seen on the prior CT examination.
IMPRESSION: No acute abnormality noted.

## 2015-11-26 IMAGING — MR MR ABDOMEN W/ CM MRCP
11 of 19 series · 27 of 48 positions shown · non-contrast
Comparison: Right upper quadrant ultrasound dated 07/12/2014. CT
abdomen pelvis dated 05/29/2014.

CLINICAL DATA: Right upper quadrant/epigastric pain,
nausea/vomiting, dilated common bile duct on ultrasound

EXAM:
MRI ABDOMEN WITHOUT CONTRAST  (INCLUDING MRCP)
TECHNIQUE: Multiplanar multisequence MR imaging of the abdomen was performed.
Heavily T2-weighted images of the biliary and pancreatic ducts were
obtained, and three-dimensional MRCP images were rendered by post
processing.

[Series 2: cor tru fisp · coronal · 4.0mm · 0.84mm/px · 3 of 65 slices shown]
[im 1/65]
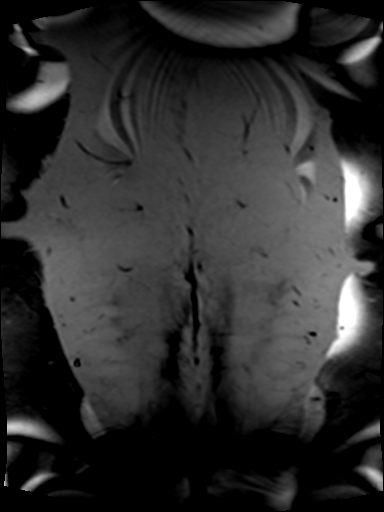
[im 33/65]
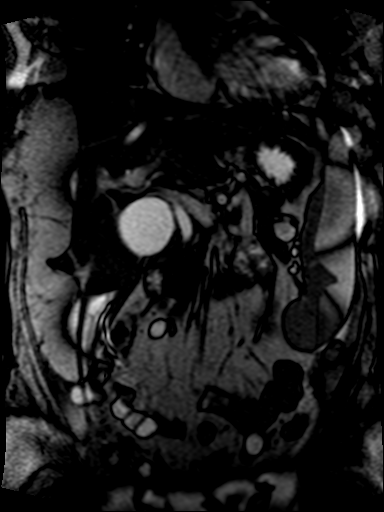
[im 65/65]
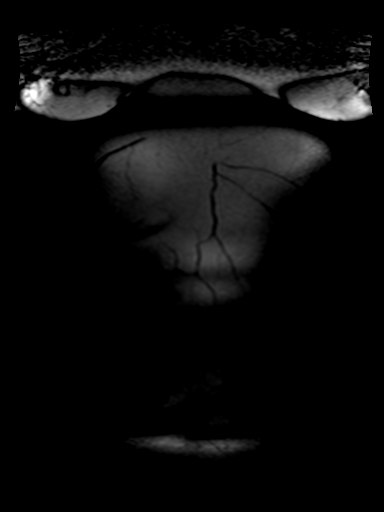

[Series 3: T2 fat-sat · axial · 8.0mm · 0.74mm/px · z∈[-127,+170]mm · 2 of 32 slices shown]
[im 1/32]
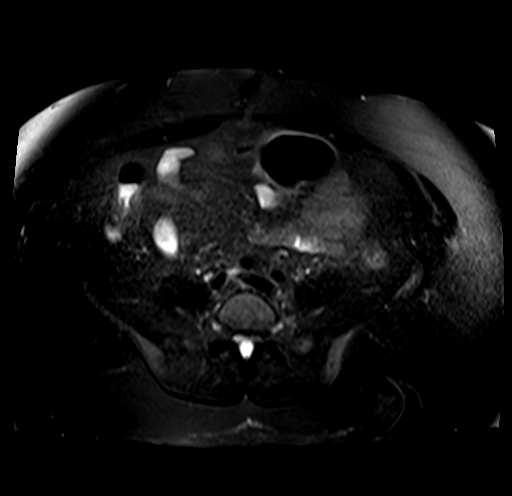
[im 32/32]
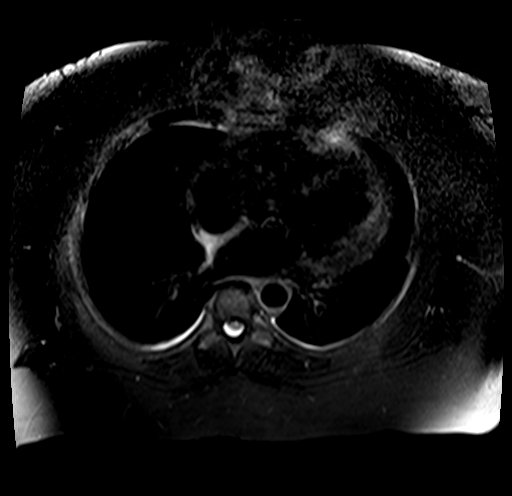

[Series 10: DWI · axial · 8.0mm · 1.98mm/px · z∈[-127,+170]mm · 3 of 96 slices shown]
[im 1/96]
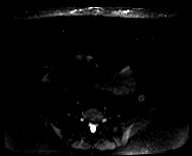
[im 48/96]
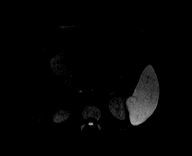
[im 96/96]
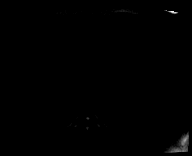

[Series 11: ax dwi_adc · axial · 8.0mm · 1.98mm/px · 1 of 32 slices shown]
[im 1/32]
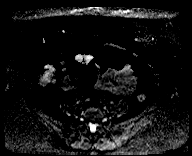

[Series 13: T2 · axial · 7.3mm · 0.74mm/px · 1 of 32 slices shown]
[im 1/32]
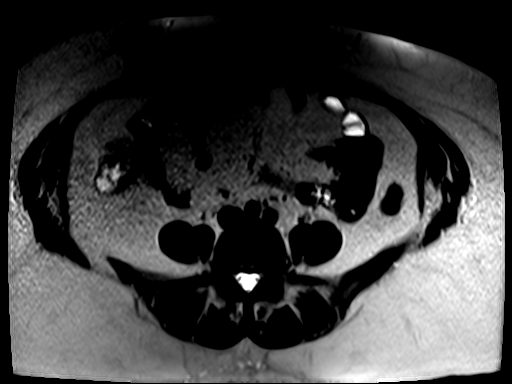

[Series 14: T1 dynamic fat-sat · axial · non-contrast · 2.5mm · 0.74mm/px · z∈[-117,+160]mm · 3 of 112 slices shown (1 of 2)]
[im 1/112]
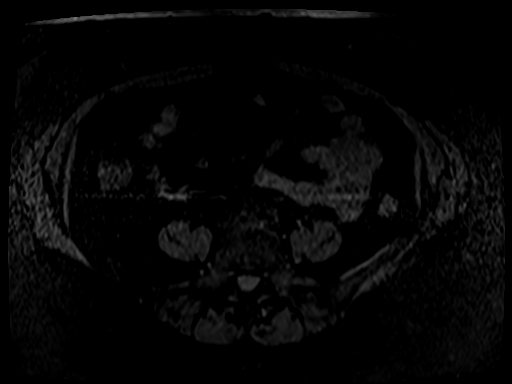
[im 56/112]
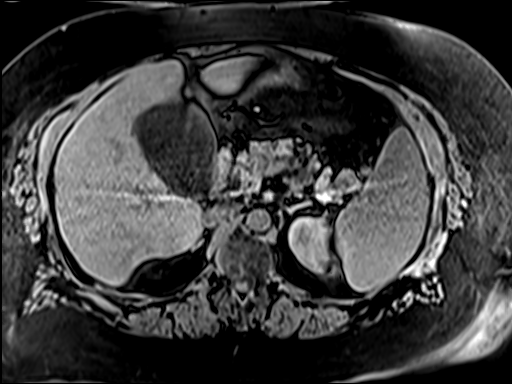
[im 112/112]
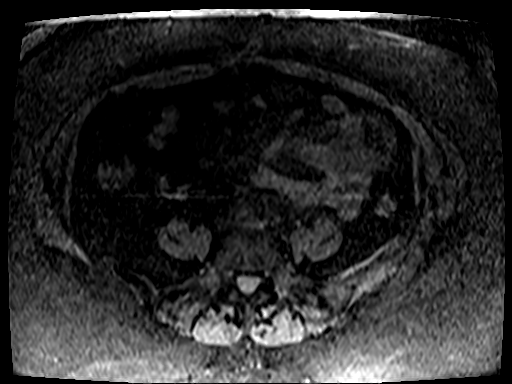

[Series 15: T1 dynamic fat-sat · axial · 2.5mm · 0.74mm/px · z∈[-117,+160]mm · 3 of 112 slices shown (2 of 2)]
[im 1/112]
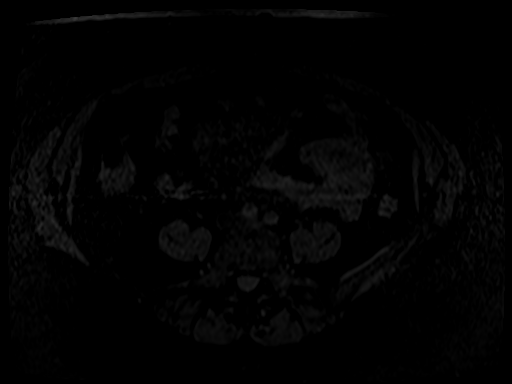
[im 56/112]
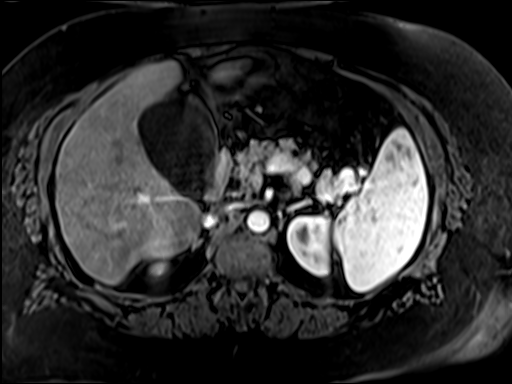
[im 112/112]
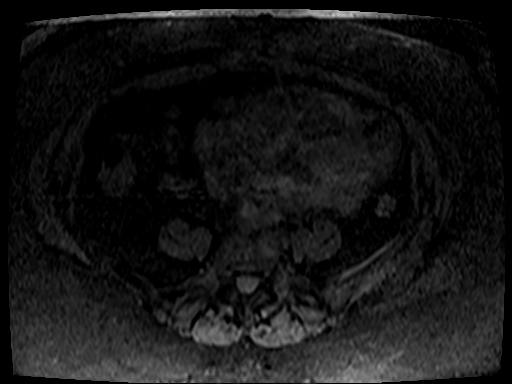

[Series 16: T1 dynamic fat-sat post-contrast · axial · 2.5mm · 0.74mm/px · z∈[-117,+160]mm · 3 of 112 slices shown (1 of 4)]
[im 1/112]
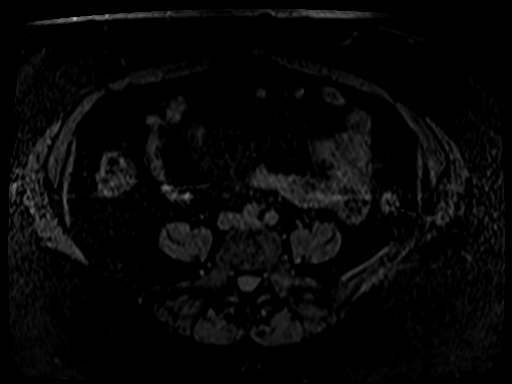
[im 56/112]
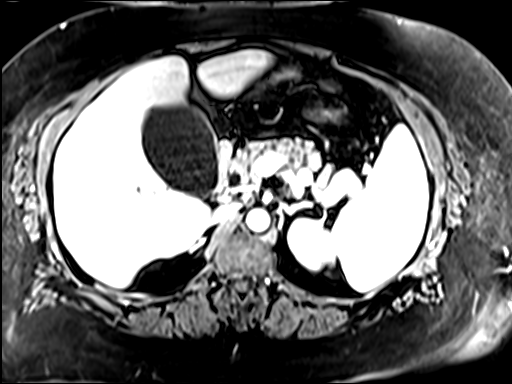
[im 112/112]
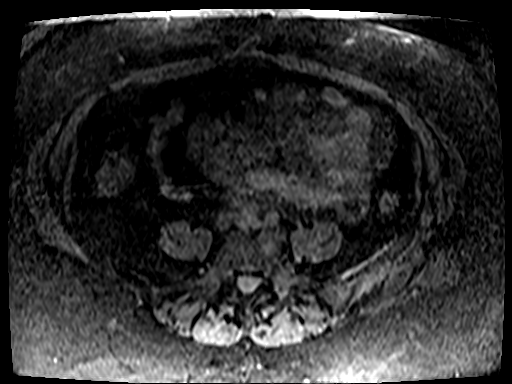

[Series 17: T1 dynamic fat-sat post-contrast · axial · 2.5mm · 0.74mm/px · z∈[-117,+160]mm · 3 of 112 slices shown (2 of 4)]
[im 1/112]
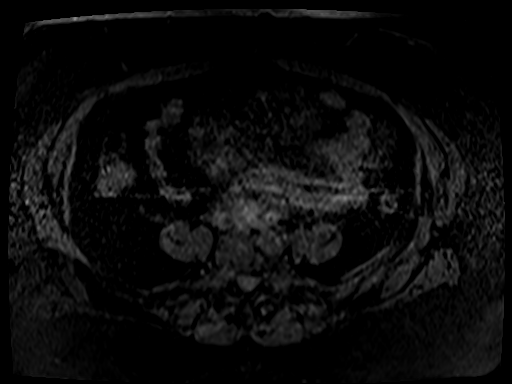
[im 56/112]
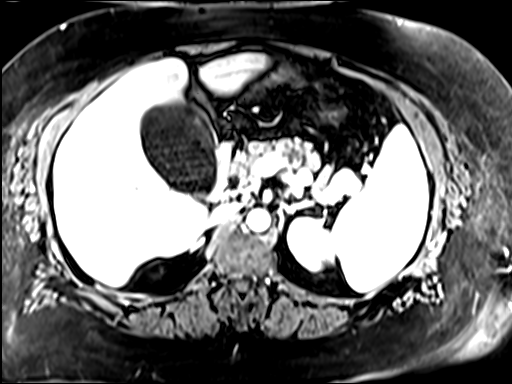
[im 112/112]
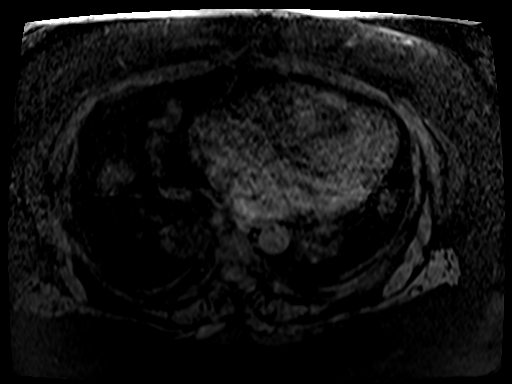

[Series 18: T1 dynamic fat-sat post-contrast · coronal · 2.5mm · 0.82mm/px · 3 of 104 slices shown (3 of 4)]
[im 1/104]
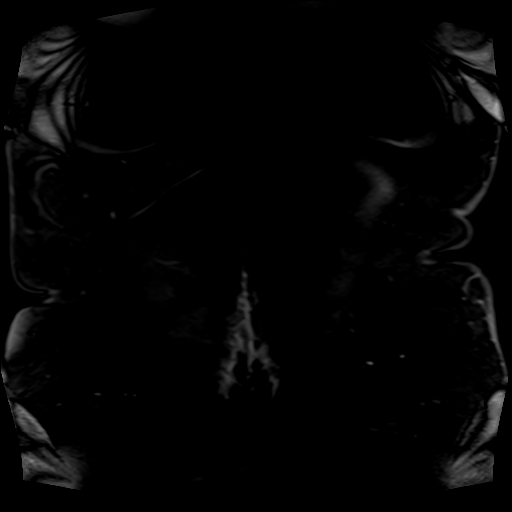
[im 52/104]
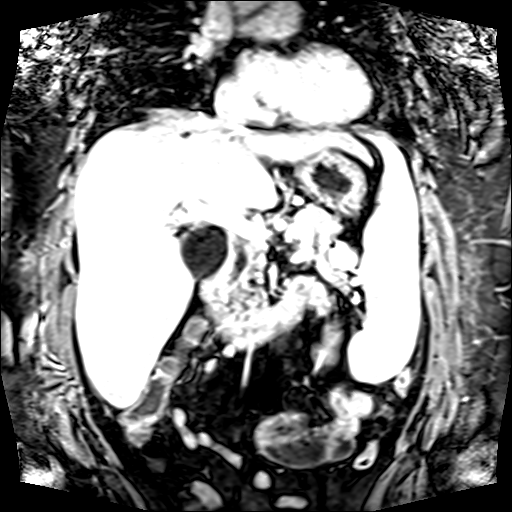
[im 104/104]
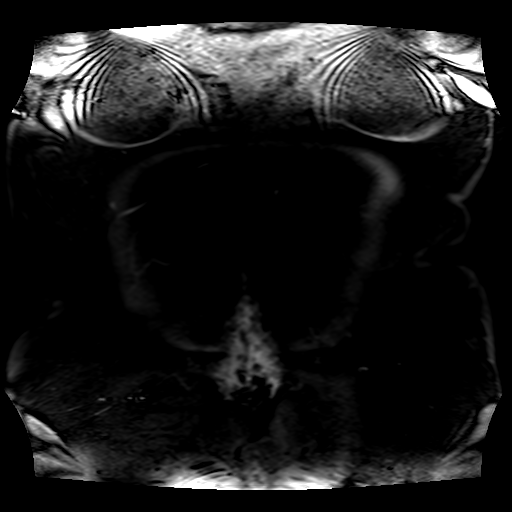

[Series 19: T1 dynamic fat-sat post-contrast · axial · 2.5mm · 0.74mm/px · z∈[-117,+20]mm · 2 of 112 slices shown (4 of 4)]
[im 1/112]
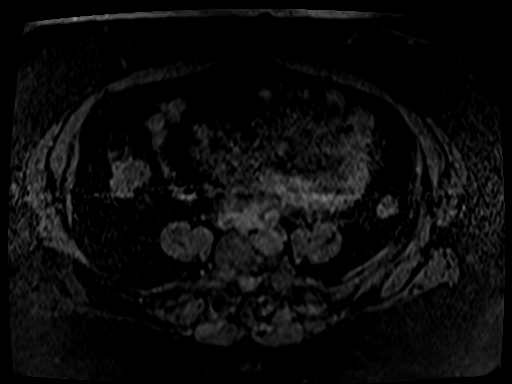
[im 56/112]
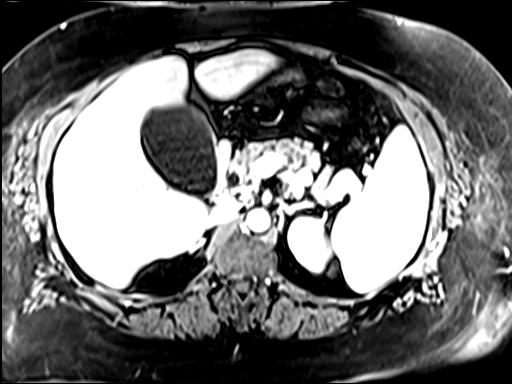

[27 of 48 positions shown; findings below may reference images not displayed]

FINDINGS: Lower chest:  Lung bases are clear.  Cardiomegaly.

Hepatobiliary: Scattered hepatic cysts, measuring up to 16 mm in the
medial segment left hepatic lobe (series 3/image 11). Mild hepatic
steatosis. No suspicious/enhancing hepatic lesions.

Gallbladder is moderately distended with layering gallbladder sludge
(series 13/ image 14). No associated inflammatory changes.

No intrahepatic ductal dilatation. Common duct measures 9 mm (series
5/ image 19), similar to prior CT. No choledocholithiasis is seen.

Pancreas: Within normal limits.

Spleen: Enlarged, measuring 19.4 cm in craniocaudal dimension,
previously 17.2 cm.

Adrenals/Urinary Tract: Adrenal glands are within normal limits.

Kidneys within normal limits.  No hydronephrosis.

Stomach/Bowel: Stomach and visualized bowel are grossly
unremarkable.

Vascular/Lymphatic: No evidence of abdominal aortic aneurysm.

No suspicious abdominal lymphadenopathy.

Other: No abdominal ascites.

Musculoskeletal: No focal osseous lesions.
IMPRESSION: Mild gallbladder distention with layering gallbladder sludge. No
associated inflammatory changes on MR.

No intrahepatic ductal dilatation. Common duct measures 9 mm,
similar to prior CT. No choledocholithiasis is seen.

Mild hepatic steatosis. Scattered hepatic cysts, measuring up to
cm. No suspicious/enhancing hepatic lesions.

Splenomegaly, measuring 19.4 cm, increased.

## 2015-11-29 IMAGING — NM NM HEPATOBILIARY IMAGE, INC GB
3 series · 18 of 18 positions shown · non-contrast
Comparison: Preoperative MRI 07/13/2014.

CLINICAL DATA: Right upper quadrant abdominal pain with drainage
from abdominal wounds status post cholecystectomy 2 days ago.
Evaluate for bile leak.

EXAM:
NUCLEAR MEDICINE HEPATOBILIARY IMAGING
TECHNIQUE: Sequential images of the abdomen were obtained [DATE] minutes
following intravenous administration of radiopharmaceutical.
RADIOPHARMACEUTICALS:  5.45 mCi 2c-55m  Choletec IV

[Series 1000: hepato bile leak 2nd series · 9.59mm/px · 6 of 30 frames shown]
[frame 3/30]
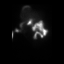
[frame 8/30]
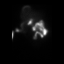
[frame 13/30]
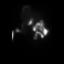
[frame 18/30]
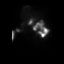
[frame 23/30]
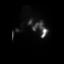
[frame 28/30]
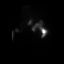

[Series 1000: hepatobiliary scan · 9.59mm/px · 6 of 59 frames shown]
[frame 5/59]
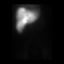
[frame 15/59]
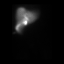
[frame 25/59]
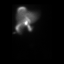
[frame 35/59]
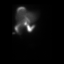
[frame 45/59]
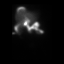
[frame 55/59]
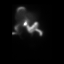

[Series 1000: hepato bile leak 3rd · 9.59mm/px · 6 of 30 frames shown]
[frame 3/30]
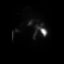
[frame 8/30]
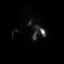
[frame 13/30]
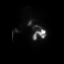
[frame 18/30]
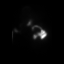
[frame 23/30]
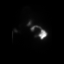
[frame 28/30]
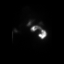

[18 of 18 positions shown; findings below may reference images not displayed]

FINDINGS: Initial images demonstrate homogeneous hepatic activity. There is
prompt opacification of the biliary system and small bowel. There is
progressive accumulation of activity within the cholecystectomy bed
with extension into the subhepatic space consistent with a bile
leak. Some of this activity may be within a surgical drain if
present. There is also accumulation of activity superior to the
right hepatic lobe.
IMPRESSION: Findings are consistent with a bile leak with accumulation of
activity in the cholecystectomy bed, subhepatic space and superior
to the right hepatic lobe. ERCP for biliary stenting recommended.

These results will be called to the ordering clinician or
representative by the Radiologist Assistant, and communication
documented in the PACS or zVision Dashboard.

## 2015-11-29 IMAGING — RF DG ERCP WO/W SPHINCTEROTOMY
1 series · 6 of 6 positions shown · non-contrast
Comparison: Abdominal MRI 07/13/2014 ; nuclear medicine HIDA scan
07/16/2014

CLINICAL DATA: 49-year-old female with biliary leak status post
cholecystectomy

EXAM:
ERCP and stent placement
TECHNIQUE: Multiple spot images obtained with the fluoroscopic device and
submitted for interpretation post-procedure.
FLUOROSCOPY TIME:  1 minutes 50 seconds fluoro time reported. Please
see operative note for complete detail

[Series 1: run · 6 of 6 slices shown]
[im 1/6]
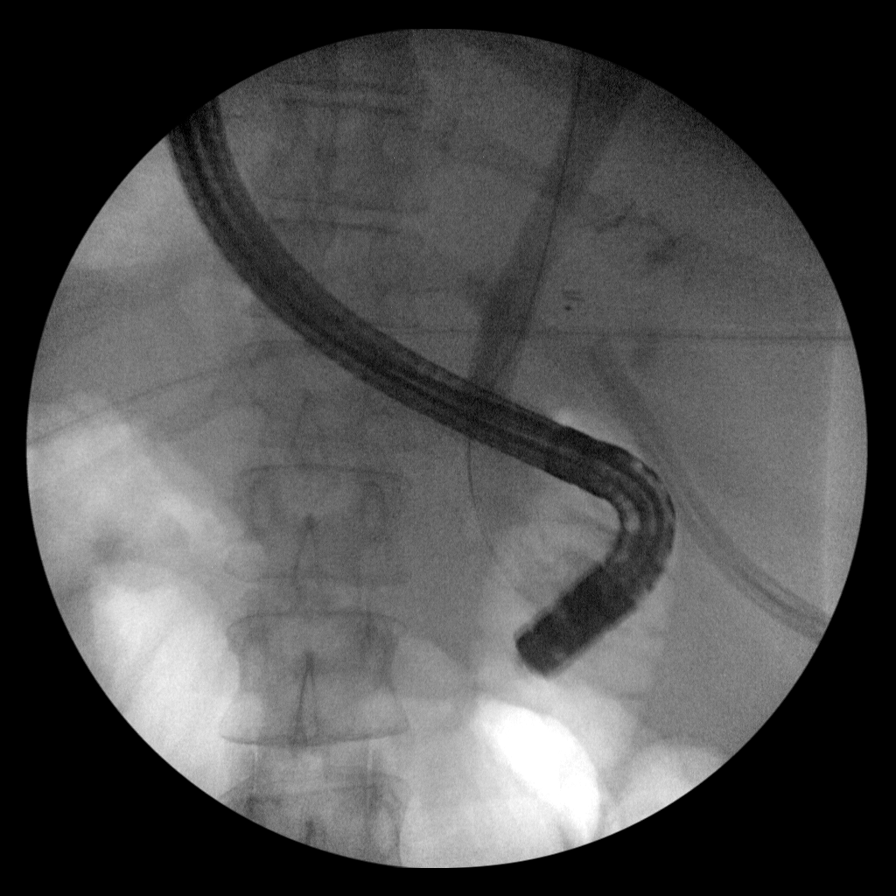
[im 2/6]
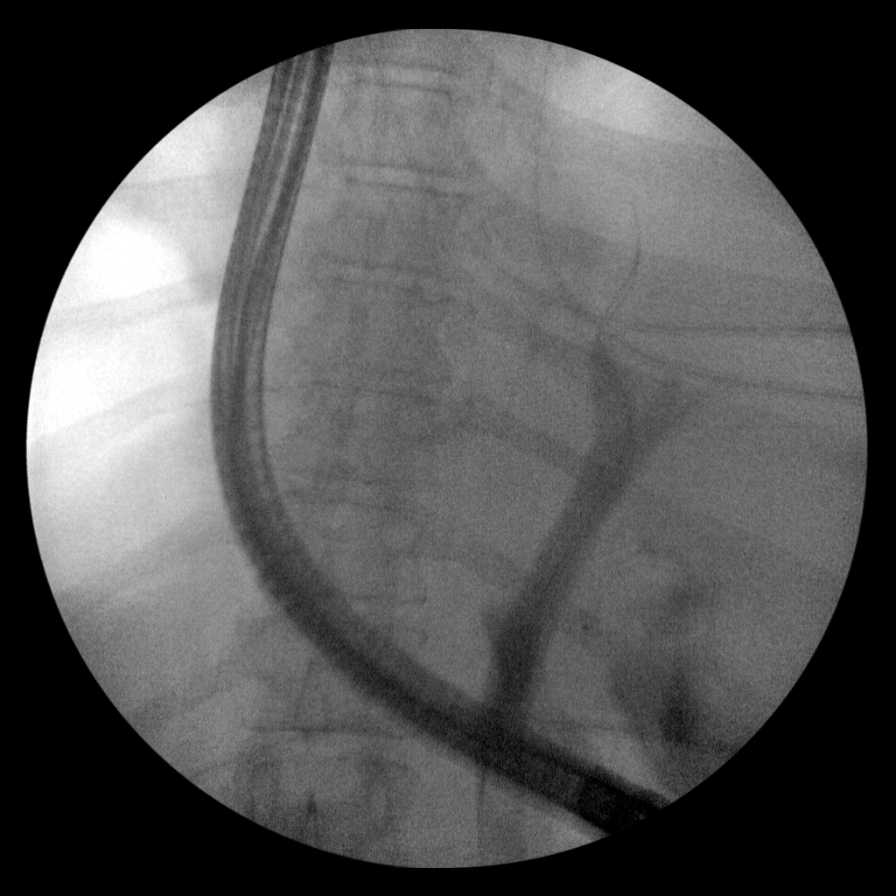
[im 3/6]
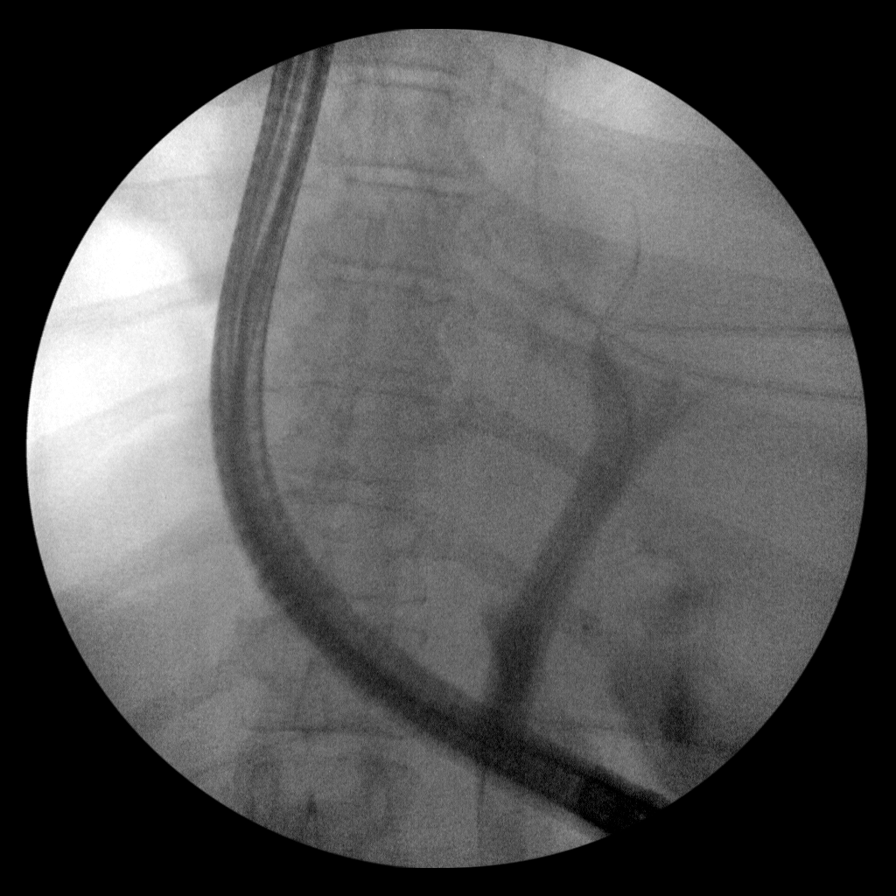
[im 4/6]
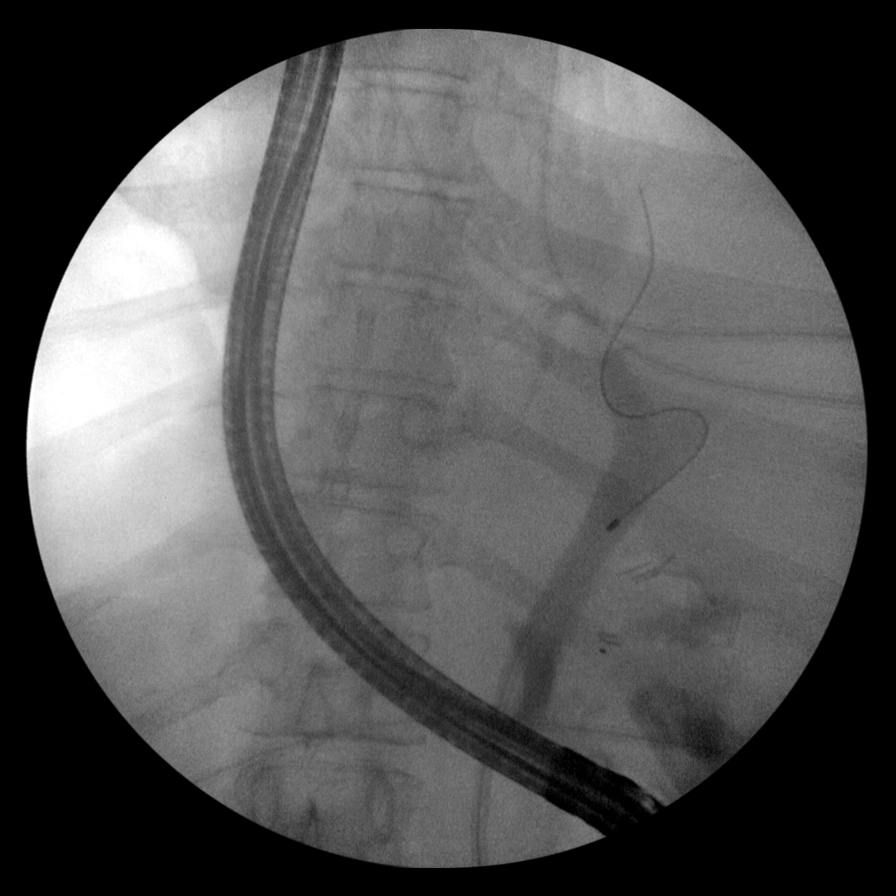
[im 5/6]
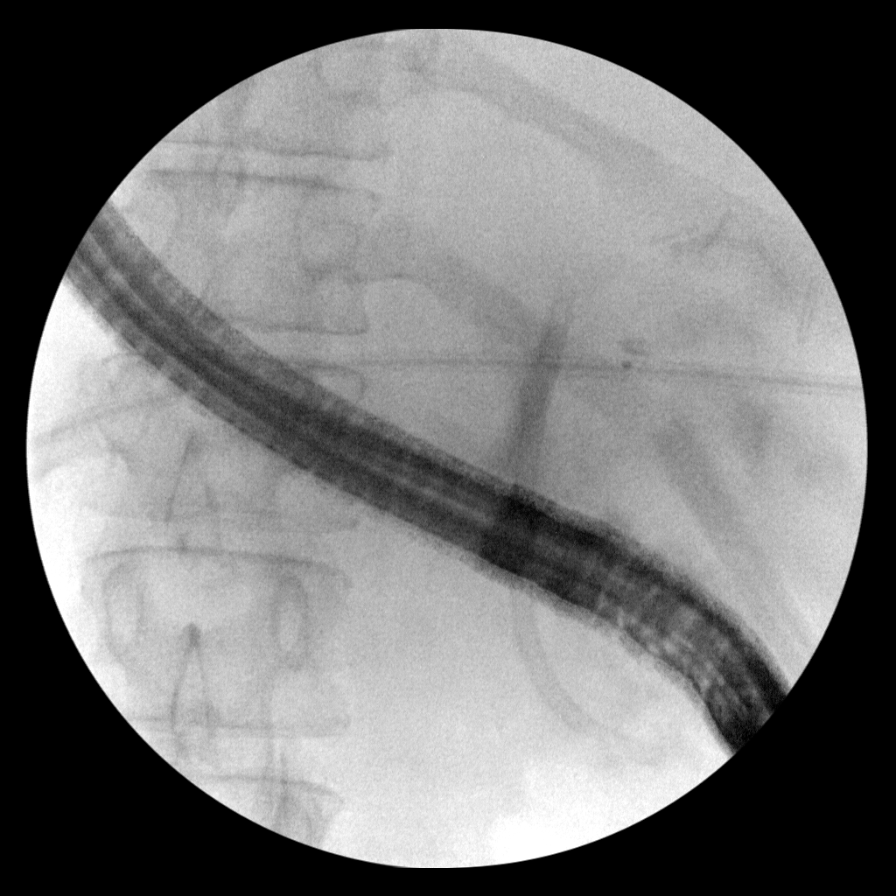
[im 6/6]
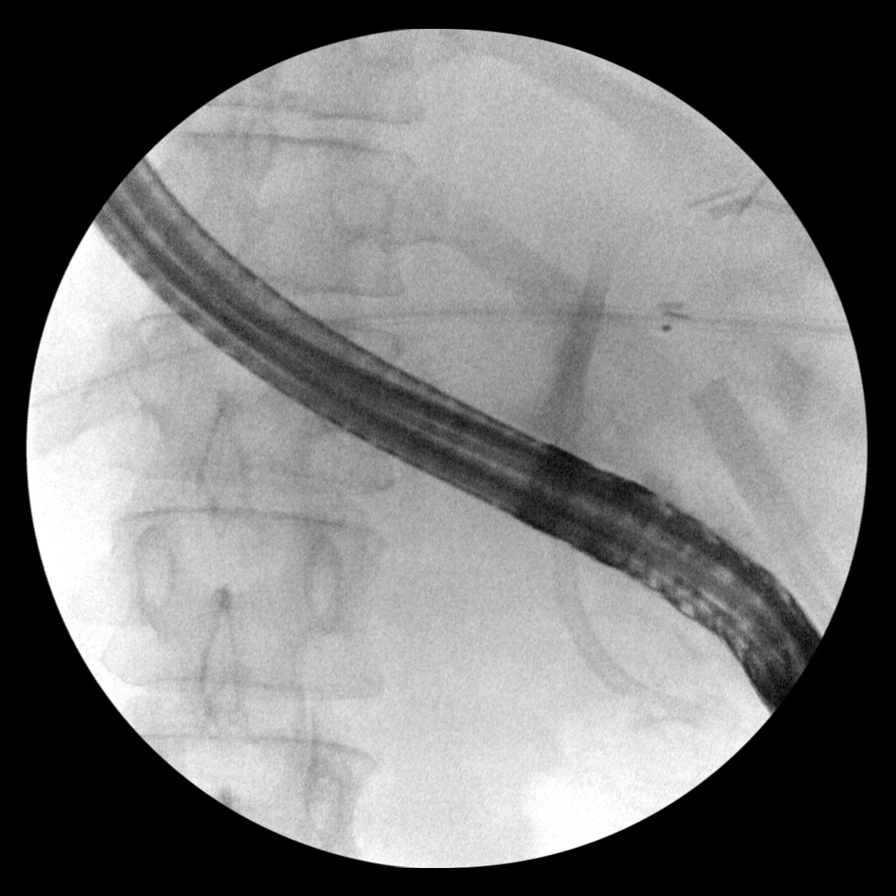

[6 of 6 positions shown; findings below may reference images not displayed]

FINDINGS: A total of 6 intraoperative spot images demonstrate cannulation of
the common bile duct and placement of a plastic biliary stent with
decompression of the biliary tree. There appears to be some
extravasation of contrast material possibly from the cystic duct
remanent.
IMPRESSION: ERCP and placement of a plastic biliary stent.

There appears to be some extravasation of contrast material,
possibly from the cystic duct remnant.

These images were submitted for radiologic interpretation only.
Please see the procedural report for the amount of contrast and the
fluoroscopy time utilized.

## 2015-11-30 ENCOUNTER — Encounter: Payer: Self-pay | Admitting: *Deleted

## 2015-11-30 ENCOUNTER — Telehealth: Payer: Self-pay | Admitting: Pain Medicine

## 2015-11-30 NOTE — Telephone Encounter (Signed)
[facility] CALLED AND STATED FILL DATES WERE 10-02-2015 AND 10-31-2015. tHEREFORE OUR FILL DATES ARE CORRECT AND SHE MUST FILL SCRIPT ON 12-01-15 AS WRITTEN. pATEINT STATES SHE TOOK EXTRA MEDICATION- WITHOUT OUR PERMISSION- AFTER A DENTAL PROCEDURE AND THAT IS WHY SHE IS OUT ONE DAY EARLY. SHE UNDERSTADS THAT SHE MUST ADHERE TO THE MEDICATION AGREEMENT.

## 2015-11-30 NOTE — Telephone Encounter (Signed)
Patient is out of meds today, refill date is 11-28 can she get them filled today ?

## 2015-12-01 ENCOUNTER — Encounter: Payer: Self-pay | Admitting: *Deleted

## 2015-12-01 ENCOUNTER — Ambulatory Visit
Admission: RE | Admit: 2015-12-01 | Discharge: 2015-12-01 | Disposition: A | Discharge status: Home / Self Care | Payer: Medicaid Other | Source: Ambulatory Visit | Attending: Gastroenterology | Admitting: Gastroenterology

## 2015-12-01 ENCOUNTER — Ambulatory Visit: Payer: Medicaid Other | Admitting: Anesthesiology

## 2015-12-01 ENCOUNTER — Encounter
Admission: RE | Disposition: A | Discharge status: Home / Self Care | Payer: Self-pay | Source: Ambulatory Visit | Attending: Gastroenterology

## 2015-12-01 ENCOUNTER — Telehealth: Payer: Self-pay

## 2015-12-01 DIAGNOSIS — E1122 Type 2 diabetes mellitus with diabetic chronic kidney disease: Secondary | ICD-10-CM | POA: Insufficient documentation

## 2015-12-01 DIAGNOSIS — F329 Major depressive disorder, single episode, unspecified: Secondary | ICD-10-CM | POA: Insufficient documentation

## 2015-12-01 DIAGNOSIS — Z79899 Other long term (current) drug therapy: Secondary | ICD-10-CM | POA: Diagnosis not present

## 2015-12-01 DIAGNOSIS — Z7982 Long term (current) use of aspirin: Secondary | ICD-10-CM | POA: Diagnosis not present

## 2015-12-01 DIAGNOSIS — Z8542 Personal history of malignant neoplasm of other parts of uterus: Secondary | ICD-10-CM | POA: Diagnosis not present

## 2015-12-01 DIAGNOSIS — Z7984 Long term (current) use of oral hypoglycemic drugs: Secondary | ICD-10-CM | POA: Diagnosis not present

## 2015-12-01 DIAGNOSIS — Z9049 Acquired absence of other specified parts of digestive tract: Secondary | ICD-10-CM | POA: Insufficient documentation

## 2015-12-01 DIAGNOSIS — I129 Hypertensive chronic kidney disease with stage 1 through stage 4 chronic kidney disease, or unspecified chronic kidney disease: Secondary | ICD-10-CM | POA: Insufficient documentation

## 2015-12-01 DIAGNOSIS — K7581 Nonalcoholic steatohepatitis (NASH): Secondary | ICD-10-CM | POA: Insufficient documentation

## 2015-12-01 DIAGNOSIS — N189 Chronic kidney disease, unspecified: Secondary | ICD-10-CM | POA: Insufficient documentation

## 2015-12-01 DIAGNOSIS — E039 Hypothyroidism, unspecified: Secondary | ICD-10-CM | POA: Diagnosis not present

## 2015-12-01 DIAGNOSIS — F419 Anxiety disorder, unspecified: Secondary | ICD-10-CM | POA: Diagnosis not present

## 2015-12-01 DIAGNOSIS — G4730 Sleep apnea, unspecified: Secondary | ICD-10-CM | POA: Insufficient documentation

## 2015-12-01 DIAGNOSIS — R1011 Right upper quadrant pain: Secondary | ICD-10-CM | POA: Insufficient documentation

## 2015-12-01 DIAGNOSIS — R1013 Epigastric pain: Secondary | ICD-10-CM | POA: Insufficient documentation

## 2015-12-01 DIAGNOSIS — J449 Chronic obstructive pulmonary disease, unspecified: Secondary | ICD-10-CM | POA: Diagnosis not present

## 2015-12-01 DIAGNOSIS — K2950 Unspecified chronic gastritis without bleeding: Secondary | ICD-10-CM | POA: Insufficient documentation

## 2015-12-01 DIAGNOSIS — Z8673 Personal history of transient ischemic attack (TIA), and cerebral infarction without residual deficits: Secondary | ICD-10-CM | POA: Insufficient documentation

## 2015-12-01 DIAGNOSIS — K449 Diaphragmatic hernia without obstruction or gangrene: Secondary | ICD-10-CM | POA: Insufficient documentation

## 2015-12-01 DIAGNOSIS — K3189 Other diseases of stomach and duodenum: Secondary | ICD-10-CM | POA: Insufficient documentation

## 2015-12-01 DIAGNOSIS — I8510 Secondary esophageal varices without bleeding: Secondary | ICD-10-CM | POA: Diagnosis not present

## 2015-12-01 DIAGNOSIS — K210 Gastro-esophageal reflux disease with esophagitis: Secondary | ICD-10-CM | POA: Insufficient documentation

## 2015-12-01 DIAGNOSIS — K7460 Unspecified cirrhosis of liver: Secondary | ICD-10-CM | POA: Insufficient documentation

## 2015-12-01 DIAGNOSIS — K766 Portal hypertension: Secondary | ICD-10-CM | POA: Diagnosis not present

## 2015-12-01 HISTORY — PX: ESOPHAGOGASTRODUODENOSCOPY (EGD) WITH PROPOFOL: SHX5813

## 2015-12-01 LAB — CBC WITH DIFFERENTIAL/PLATELET
Basophils Absolute: 0 10*3/uL (ref 0–0.1)
Basophils Relative: 1 %
Eosinophils Absolute: 0 10*3/uL (ref 0–0.7)
Eosinophils Relative: 1 %
HCT: 43.5 % (ref 35.0–47.0)
Hemoglobin: 15.4 g/dL (ref 12.0–16.0)
Lymphocytes Relative: 23 %
Lymphs Abs: 1.3 10*3/uL (ref 1.0–3.6)
MCH: 35.9 pg — ABNORMAL HIGH (ref 26.0–34.0)
MCHC: 35.4 g/dL (ref 32.0–36.0)
MCV: 101.3 fL — ABNORMAL HIGH (ref 80.0–100.0)
Monocytes Absolute: 0.4 10*3/uL (ref 0.2–0.9)
Monocytes Relative: 7 %
Neutro Abs: 3.8 10*3/uL (ref 1.4–6.5)
Neutrophils Relative %: 68 %
Platelets: 98 10*3/uL — ABNORMAL LOW (ref 150–440)
RBC: 4.29 MIL/uL (ref 3.80–5.20)
RDW: 13.5 % (ref 11.5–14.5)
WBC: 5.5 10*3/uL (ref 3.6–11.0)

## 2015-12-01 LAB — PROTIME-INR
INR: 1.18
Prothrombin Time: 15.1 seconds (ref 11.4–15.2)

## 2015-12-01 LAB — GLUCOSE, CAPILLARY
Glucose-Capillary: 260 mg/dL — ABNORMAL HIGH (ref 65–99)
Glucose-Capillary: 278 mg/dL — ABNORMAL HIGH (ref 65–99)

## 2015-12-01 IMAGING — CR DG CHEST 1V
1 series · 1 of 1 positions shown · non-contrast
Comparison: November 15, 2013

CLINICAL DATA: Asthma with rales

EXAM:
CHEST  1 VIEW

[portable]
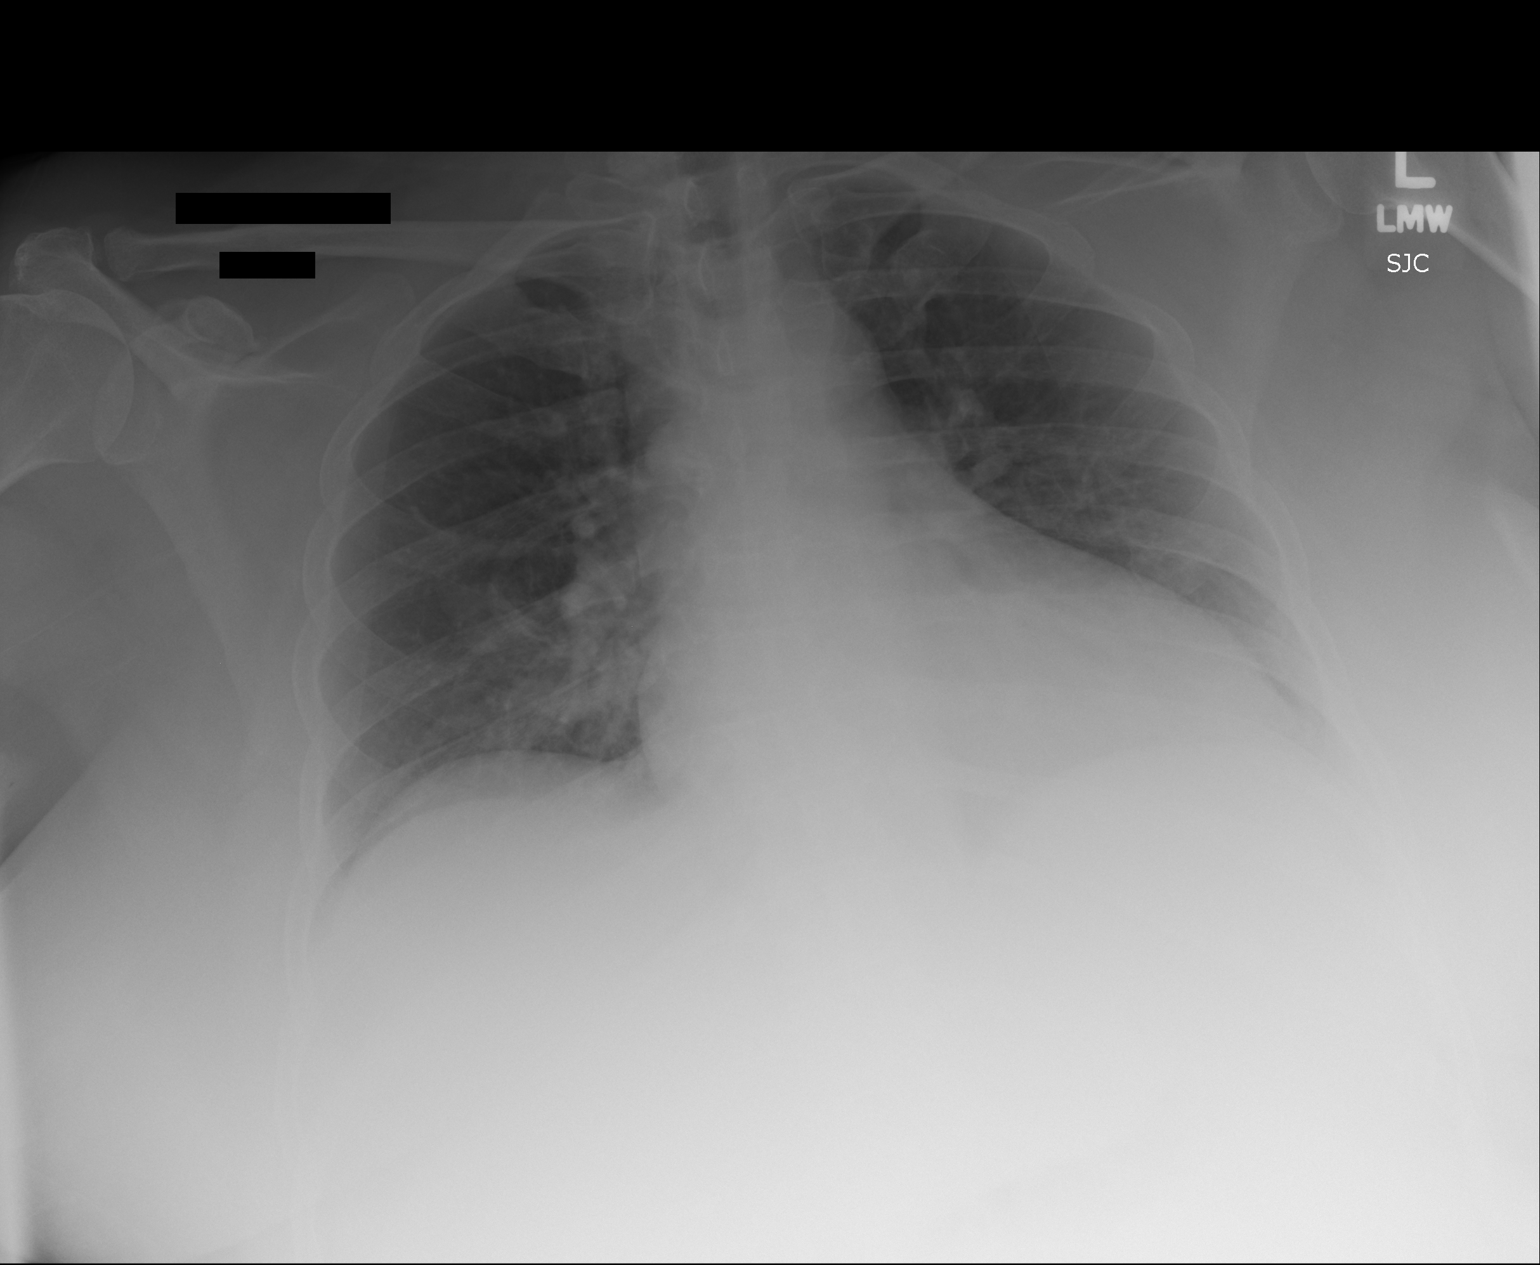

[1 of 1 positions shown; findings below may reference images not displayed]

FINDINGS: There is no edema or consolidation. Heart is upper normal in size
with pulmonary vascularity within normal limits. No adenopathy. No
bone lesions.
IMPRESSION: No edema or consolidation.

## 2015-12-01 SURGERY — ESOPHAGOGASTRODUODENOSCOPY (EGD) WITH PROPOFOL
Anesthesia: General

## 2015-12-01 MED ORDER — PROPOFOL 500 MG/50ML IV EMUL
INTRAVENOUS | Status: DC | PRN
  Administered 2015-12-01: 125 ug/kg/min via INTRAVENOUS

## 2015-12-01 MED ORDER — GLYCOPYRROLATE 0.2 MG/ML IJ SOLN
INTRAMUSCULAR | Status: DC | PRN
  Administered 2015-12-01: 0.1 mg via INTRAVENOUS

## 2015-12-01 MED ORDER — LIDOCAINE HCL (CARDIAC) 10 MG/ML IV SOLN
INTRAVENOUS | Status: DC | PRN
  Administered 2015-12-01 (×3): 10 mg via INTRAVENOUS

## 2015-12-01 MED ORDER — SODIUM CHLORIDE 0.9 % IV SOLN: INTRAVENOUS | Status: DC

## 2015-12-01 MED ORDER — INSULIN ASPART 100 UNIT/ML ~~LOC~~ SOLN
5.0000 [IU] | Freq: Once | SUBCUTANEOUS | Status: AC
  Administered 2015-12-01: 5 [IU] via SUBCUTANEOUS

## 2015-12-01 MED ORDER — MIDAZOLAM HCL 2 MG/2ML IJ SOLN
INTRAMUSCULAR | Status: DC | PRN
  Administered 2015-12-01: 2 mg via INTRAVENOUS

## 2015-12-01 MED ORDER — PROPOFOL 10 MG/ML IV BOLUS
INTRAVENOUS | Status: DC | PRN
  Administered 2015-12-01 (×3): 40 mg via INTRAVENOUS

## 2015-12-01 MED ORDER — SODIUM CHLORIDE 0.9 % IV SOLN
INTRAVENOUS | Status: DC
  Administered 2015-12-01: 08:00:00 via INTRAVENOUS

## 2015-12-01 NOTE — Telephone Encounter (Signed)
Pharmacy called and I requested they sed the prior auth request. We have not received one. Called patient and informed her that as soon as we get prior auth request from pharmacy , we will work on it.

## 2015-12-01 NOTE — Anesthesia Preprocedure Evaluation (Signed)
Anesthesia Evaluation  Patient identified by MRN, date of birth, ID band Patient awake    Reviewed: Allergy & Precautions, NPO status , Patient's Chart, lab work & pertinent test results, reviewed documented beta blocker date and time   Airway Mallampati: III  TM Distance: >3 FB     Dental  (+) Chipped   Pulmonary asthma , sleep apnea , COPD,           Cardiovascular hypertension, Pt. on medications      Neuro/Psych  Headaches, PSYCHIATRIC DISORDERS Anxiety Depression TIA Neuromuscular disease    GI/Hepatic GERD  Controlled,(+) Hepatitis -  Endo/Other  diabetes, Type 2Hypothyroidism   Renal/GU Renal disease     Musculoskeletal  (+) Arthritis ,   Abdominal   Peds  Hematology   Anesthesia Other Findings   Reproductive/Obstetrics                             Anesthesia Physical Anesthesia Plan  ASA: III  Anesthesia Plan: General   Post-op Pain Management:    Induction: Intravenous  Airway Management Planned:   Additional Equipment:   Intra-op Plan:   Post-operative Plan:   Informed Consent: I have reviewed the patients History and Physical, chart, labs and discussed the procedure including the risks, benefits and alternatives for the proposed anesthesia with the patient or authorized representative who has indicated his/her understanding and acceptance.     Plan Discussed with: CRNA  Anesthesia Plan Comments:         Anesthesia Quick Evaluation

## 2015-12-01 NOTE — Telephone Encounter (Signed)
They had to pay full price for her oxycodone at the pharmacy. They say it needs prior auth. Please call.

## 2015-12-01 NOTE — Transfer of Care (Signed)
Immediate Anesthesia Transfer of Care Note  Patient: Lori Mckenzie  Procedure(s) Performed: Procedure(s): ESOPHAGOGASTRODUODENOSCOPY (EGD) WITH PROPOFOL (N/A)  Patient Location: PACU  Anesthesia Type:General  Level of Consciousness: responds to stimulation  Airway & Oxygen Therapy: Patient Spontanous Breathing and Patient connected to nasal cannula oxygen  Post-op Assessment: Report given to RN and Post -op Vital signs reviewed and stable  Post vital signs: Reviewed and stable  Last Vitals:  Vitals:   12/01/15 0908 12/01/15 0912  BP: 101/69 101/69  Pulse: 93 93  Resp: (!) 25 (!) 25  Temp: 36.2 C     Last Pain:  Vitals:   12/01/15 0912  TempSrc: Tympanic  PainSc: Asleep         Complications: No apparent anesthesia complications

## 2015-12-01 NOTE — Telephone Encounter (Signed)
PA sent 12/01/15 by Anderson Malta

## 2015-12-01 NOTE — Anesthesia Postprocedure Evaluation (Signed)
Anesthesia Post Note  Patient: Lori Mckenzie  Procedure(s) Performed: Procedure(s) (LRB): ESOPHAGOGASTRODUODENOSCOPY (EGD) WITH PROPOFOL (N/A)  Patient location during evaluation: Endoscopy Anesthesia Type: General Level of consciousness: awake and alert Pain management: pain level controlled Vital Signs Assessment: post-procedure vital signs reviewed and stable Respiratory status: spontaneous breathing, nonlabored ventilation, respiratory function stable and patient connected to nasal cannula oxygen Cardiovascular status: blood pressure returned to baseline and stable Postop Assessment: no signs of nausea or vomiting Anesthetic complications: no    Last Vitals:  Vitals:   12/01/15 0928 12/01/15 0938  BP: 100/77 120/88  Pulse: 94 93  Resp: (!) 22 (!) 24  Temp:      Last Pain:  Vitals:   12/01/15 0912  TempSrc: Tympanic  PainSc: [name]

## 2015-12-01 NOTE — Op Note (Signed)
[facility] Patient Name: Lori Mckenzie Procedure Date: 12/01/2015 8:45 AM MRN: [id number] Account #: [id number] Date of Birth: 07-26-1965 Admit Type: Outpatient Age: 50 Room: [facility] ENDO ROOM 3 Gender: Female Note Status: Finalized Procedure:            Upper GI endoscopy Indications:          Epigastric abdominal pain, Abdominal pain in the right                        upper quadrant Providers:            Lollie Sails, MD Referring MD:         Neldon Labella. Ashkin MD (Referring MD) Medicines:            Monitored Anesthesia Care Complications:        No immediate complications. Procedure:            Pre-Anesthesia Assessment:                       - ASA Grade Assessment: III - A patient with severe                        systemic disease.                       After obtaining informed consent, the endoscope was                        passed under direct vision. Throughout the procedure,                        the patient's blood pressure, pulse, and oxygen                        saturations were monitored continuously. The Endoscope                        was introduced through the mouth, and advanced to the                        third part of duodenum. The upper GI endoscopy was                        accomplished without difficulty. The patient tolerated                        the procedure well. Findings:      Small (< 5 mm) varices were found in the distal esophagus and at the       gastroesophageal junction. They were 2 mm in largest diameter. No red       marks or signs of bleeding.      The exam of the esophagus was otherwise normal.      Mild portal hypertensive gastropathy was found in the gastric body.      Diffuse mild inflammation characterized by congestion (edema) and       erythema was found in the gastric body and in the gastric antrum.       Biopsies were taken with a cold forceps for histology.      The cardia and gastric fundus  were normal on retroflexion otherwise.  The examined duodenum was normal.      A small hiatal hernia was present. Impression:           - Small (< 5 mm) esophageal varices.                       - Portal hypertensive gastropathy.                       - Bile gastritis. Biopsied.                       - Normal examined duodenum. Recommendation:       - Discharge patient to home.                       - Continue present medications.                       - Use sucralfate suspension 1 gram PO QID daily.                       - Return to GI clinic in 1 month. Procedure Code(s):    --- Professional ---                       [phone], Esophagogastroduodenoscopy, flexible, transoral;                        with biopsy, single or multiple Diagnosis Code(s):    --- Professional ---                       I85.00, Esophageal varices without bleeding                       K76.6, Portal hypertension                       K31.89, Other diseases of stomach and duodenum                       K29.60, Other gastritis without bleeding                       R10.13, Epigastric pain                       R10.11, Right upper quadrant pain CPT copyright 2016 [employer]. All rights reserved. The codes documented in this report are preliminary and upon coder review may  be revised to meet current compliance requirements. Lollie Sails, MD 12/01/2015 9:11:08 AM This report has been signed electronically. Number of Addenda: 0 Note Initiated On: 12/01/2015 8:45 AM      [facility]

## 2015-12-01 NOTE — H&P (Signed)
Outpatient short stay form Pre-procedure 12/01/2015 8:42 AM Lori Sails MD  Primary Physician: Dr. [name]  Reason for visit:  EGD  History of present illness:  Patient is a 50 year old female presenting as above. She has a history of a cholecystectomy done about a year and a half ago that was complicated with a biloma. She subsequently did undergo ERCP. He does have a history of reflux related esophagitis. He states she has chronic epigastric right upper quadrant discomfort. This is variable but has improved with time. She was changed to pantoprazole 40 mg which he is taking twice a day. She does have some bitter reflux.  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 Platelet count and pro time were checked today noted. There is also a history of irritable bowel syndrome.    Current Facility-Administered Medications:  .  0.9 %  sodium chloride infusion, , Intravenous, Continuous, Lori Sails, MD, Last Rate: 20 mL/hr at 12/01/15 0810 .  0.9 %  sodium chloride infusion, , Intravenous, Continuous, Lori Sails, MD  Prescriptions Prior to Admission  Medication Sig Dispense Refill Last Dose  . albuterol (ACCUNEB) 0.63 MG/3ML nebulizer solution Inhale 1 ampule by nebulization every six (6) hours as needed for wheezing.   Taking  . albuterol (PROVENTIL HFA;VENTOLIN HFA) 108 (90 BASE) MCG/ACT inhaler Inhale 2 puffs into the lungs every 4 (four) hours as needed for wheezing or shortness of breath.   Taking  . ALPRAZolam (XANAX) 1 MG tablet Take 1-2 mg by mouth 3 (three) times daily as needed for anxiety.   11/30/2015 at Unknown time  . aspirin EC 81 MG tablet Take 81 mg by mouth daily.    Past Month at Unknown time  . Cholecalciferol (VITAMIN D-1000 MAX ST) 1000 units tablet Take by mouth.   11/30/2015 at Unknown time  . citalopram (CELEXA) 20 MG tablet Take 20 mg by mouth.   11/30/2015 at Unknown time  . dicyclomine (BENTYL) 10 MG capsule Take 10 mg by mouth 4 (four) times daily -   before meals and at bedtime.    11/30/2015 at Unknown time  . fluticasone (FLONASE) 50 MCG/ACT nasal spray Place 1-2 sprays into both nostrils daily as needed for rhinitis.    11/30/2015 at Unknown time  . Folate-B12-Intrinsic Factor (INTRINSI B12-FOLATE) [id number] MCG-MCG-MG TABS Take 1 tablet by mouth daily. Reported on 04/06/2015   11/30/2015 at Unknown time  . furosemide (LASIX) 40 MG tablet Take 80 mg by mouth daily.    11/30/2015 at Unknown time  . gabapentin (NEURONTIN) 300 MG capsule Take 900 mg by mouth 4 (four) times daily as needed (for pain).    11/30/2015 at Unknown time  . glipiZIDE (GLUCOTROL) 5 MG tablet Take 5 mg by mouth 2 (two) times daily before a meal.    11/30/2015 at Unknown time  . ipratropium (ATROVENT) 0.02 % nebulizer solution Inhale 500 mcg by nebulization Four (4) times a day.   Taking  . lactulose (CHRONULAC) 10 GM/15ML solution Take 30 g by mouth 3 (three) times daily as needed.    11/30/2015 at Unknown time  . nystatin (MYCOSTATIN) powder Apply 1 g topically 3 (three) times daily as needed (for irritation).    11/30/2015 at Unknown time  . ondansetron (ZOFRAN) 4 MG tablet Take by mouth as needed.    Taking  . promethazine (PHENERGAN) 12.5 MG tablet Take 12.5 mg by mouth every 6 (six) hours as needed for nausea or vomiting.    Taking  .  rifaximin (XIFAXAN) 550 MG TABS tablet Take 550 mg by mouth 2 (two) times daily.    11/30/2015 at Unknown time  . rizatriptan (MAXALT) 10 MG tablet Take 10 mg by mouth as needed for migraine.    Taking  . spironolactone (ALDACTONE) 100 MG tablet Take 100 mg by mouth daily.    11/30/2015 at Unknown time  . triamcinolone ointment (KENALOG) 0.1 % Apply topically.   Taking  . vitamin B-12 (CYANOCOBALAMIN) 1000 MCG tablet Take 2,000 mcg by mouth daily.   11/30/2015 at Unknown time  . hydrochlorothiazide (HYDRODIURIL) 25 MG tablet Take 25 mg by mouth at bedtime.    Not Taking at Unknown time  . metFORMIN (GLUCOPHAGE) 500 MG tablet Take 500 mg  by mouth 2 (two) times daily with a meal.     . naloxone (NARCAN) 2 MG/2ML injection Inject into the vein as needed.   Not Taking at Unknown time  . Oxycodone HCl 10 MG TABS Take 1 tablet (10 mg total) by mouth 5 (five) times daily as needed. 150 tablet 0   . Oxycodone HCl 10 MG TABS Take 1 tablet (10 mg total) by mouth 5 (five) times daily as needed. 150 tablet 0   . Oxycodone HCl 10 MG TABS Take 1 tablet (10 mg total) by mouth 5 (five) times daily as needed. 150 tablet 0   . pantoprazole (PROTONIX) 40 MG tablet Take by mouth.   Taking     Allergies  Allergen Reactions  . Tape Swelling  . Vicodin [Hydrocodone-Acetaminophen] Itching and Rash     Past Medical History:  Diagnosis Date  . Anxiety   . Ascites   . Asthma   . Back pain   . Brittle bone disease   . Cancer ([place])    Uteriine  ca 57yr ago partial hysterectomy  . Cervical disc disease   . Chronic kidney disease   . [place]    RA  3-4 yrs ago  . COPD (chronic obstructive pulmonary disease) [place]   . [place]   . GERD (gastroesophageal reflux disease)   . Hypertension   . Hypothyroidism   . Migraines   . NASH (nonalcoholic steatohepatitis)   . Respiratory infection    2/17  . Sleep apnea   . Sleep apnea   . Thyroid disease   . TIA (transient ischemic attack)     Review of systems:      Physical Exam    Heart and lungs: Regular rate and rhythm without rub or gallop, lungs are bilaterally clear.    HEENT: Normocephalic atraumatic eyes are anicteric    Other:     Pertinant exam for procedure: Soft mild discomfort palpation in the right upper quadrant. Mild discomfort across the lower abdomen. No masses or rebound. No distention. Bowel sounds are positive and normoactive.    Planned proceedures: EGD and indicated procedures. I have discussed the risks benefits and complications of procedures to include not limited to bleeding, infection,  perforation and the risk of sedation and the patient wishes to proceed.    MLollie Sails MD Gastroenterology 12/01/2015  8:42 AM

## 2015-12-02 ENCOUNTER — Encounter: Payer: Self-pay | Admitting: Gastroenterology

## 2015-12-02 LAB — SURGICAL PATHOLOGY

## 2015-12-05 IMAGING — CT CT ABD-PELV W/ CM
1 of 3 series · 12 of 32 positions shown, 17 images · IV contrast (omnipaque)
Comparison: 07/16/2014 ERCP. 07/13/2014 MR abdomen. 05/29/2014 CT
abdomen and pelvis.

CLINICAL DATA: 49-year-old female post laparoscopic cholecystectomy
07/16/2014 found to have bile leak treated with stent placement.
Still experiencing pain. Subsequent encounter.

EXAM:
CT ABDOMEN AND PELVIS WITH CONTRAST
TECHNIQUE: Multidetector CT imaging of the abdomen and pelvis was performed
using the standard protocol following bolus administration of
intravenous contrast.
CONTRAST:  100mL OMNIPAQUE IOHEXOL 300 MG/ML  SOLN

[Series 2: routine abd pel with · axial · 0.93mm/px · z∈[-510,-45]mm · 12 of 105 slices shown, 17 images]
[im 6/105  soft-tissue]
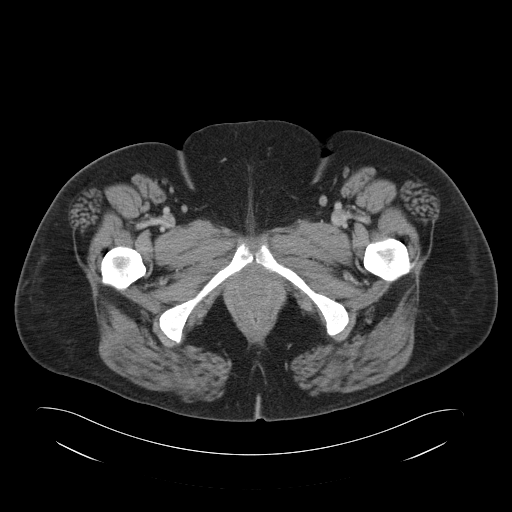
[im 6/105  bone]
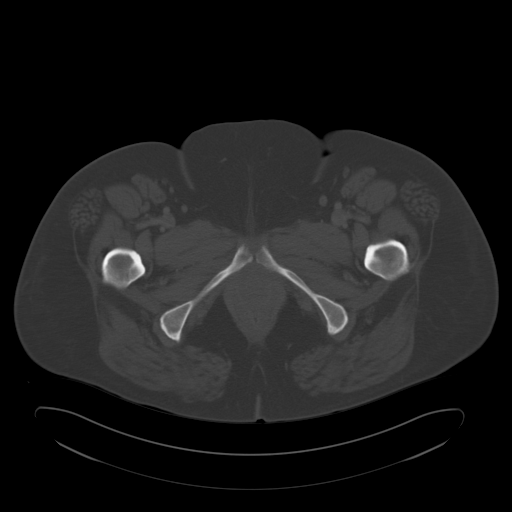
[im 18/105  soft-tissue]
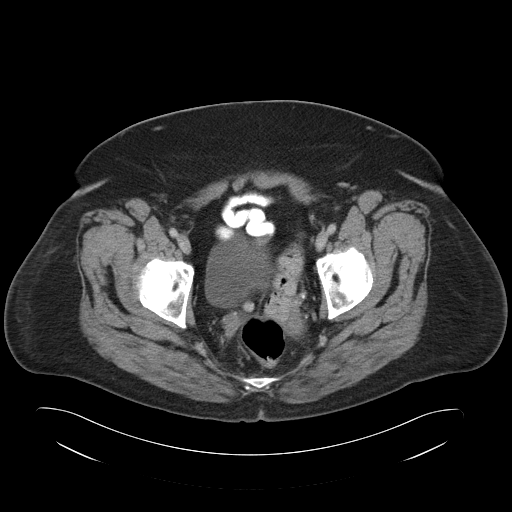
[im 24/105  soft-tissue]
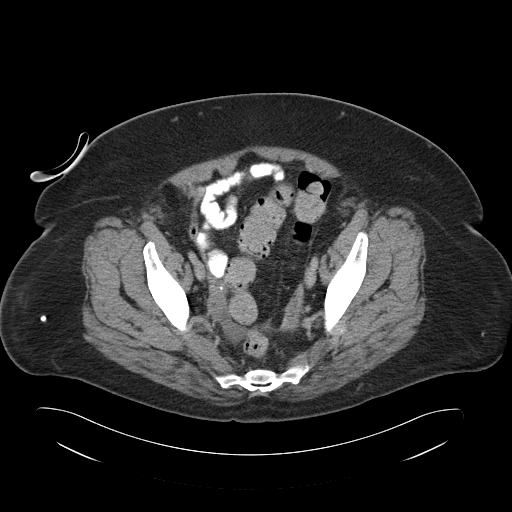
[im 35/105  soft-tissue]
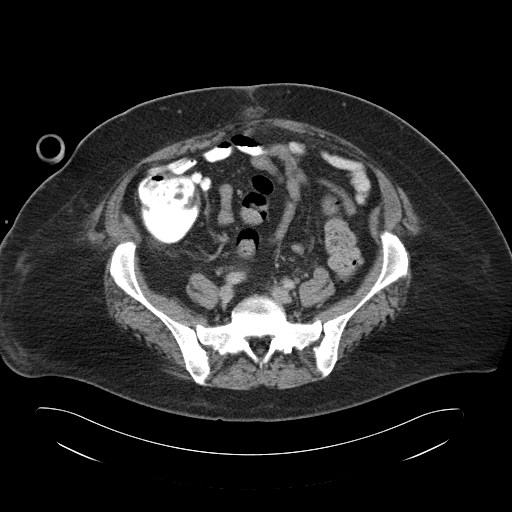
[im 41/105  soft-tissue]
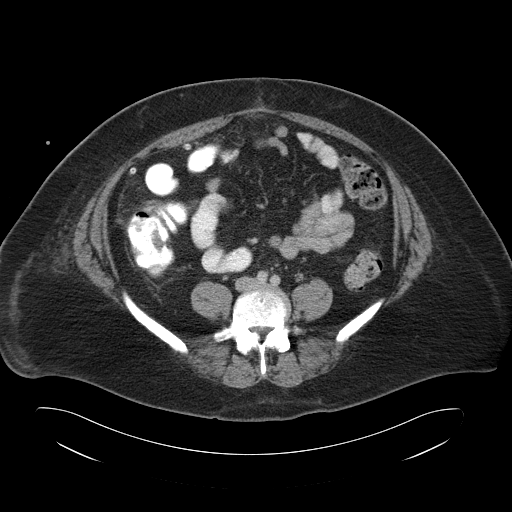
[im 53/105  soft-tissue]
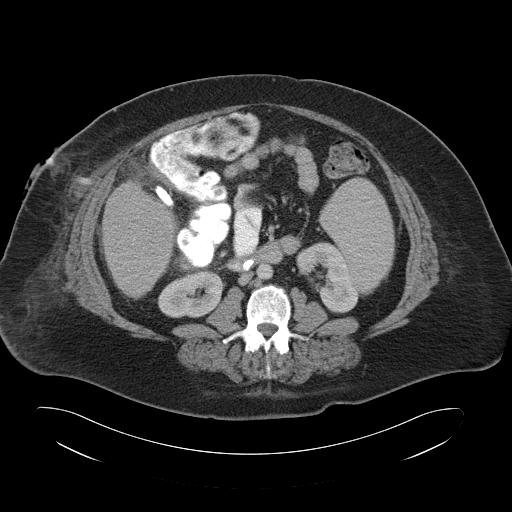
[im 64/105  soft-tissue]
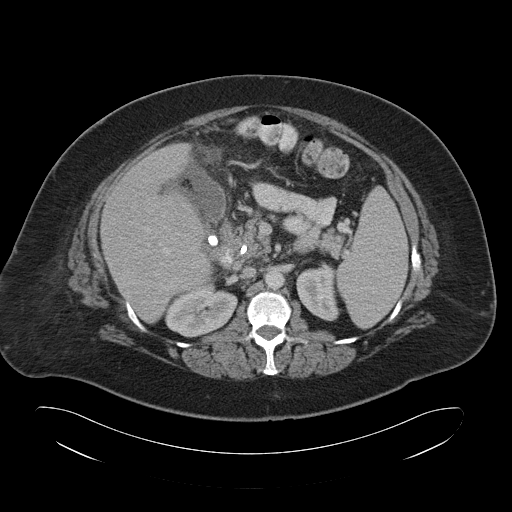
[im 70/105  soft-tissue]
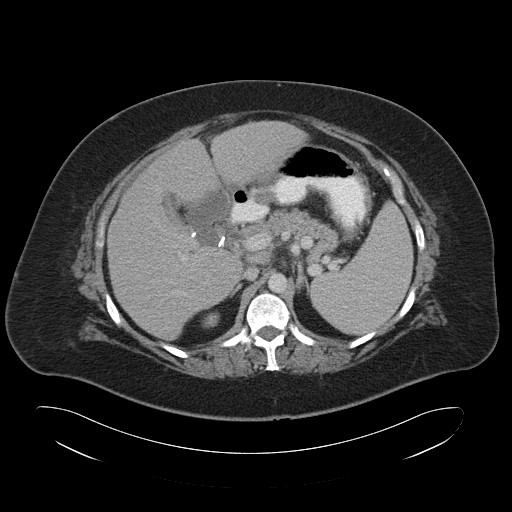
[im 81/105  soft-tissue]
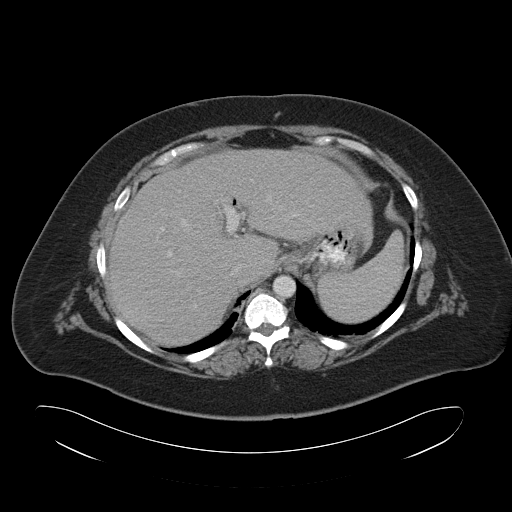
[im 81/105  lung]
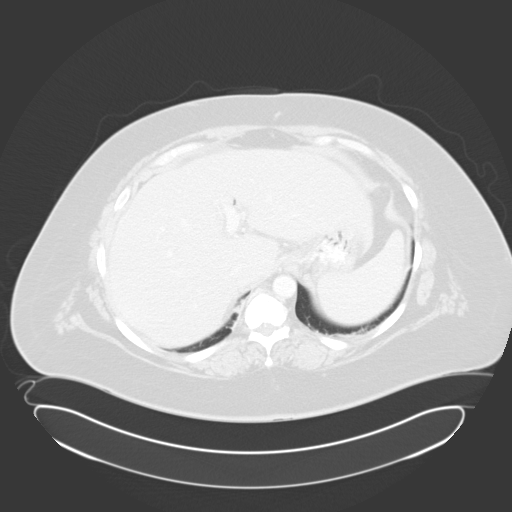
[im 81/105  bone]
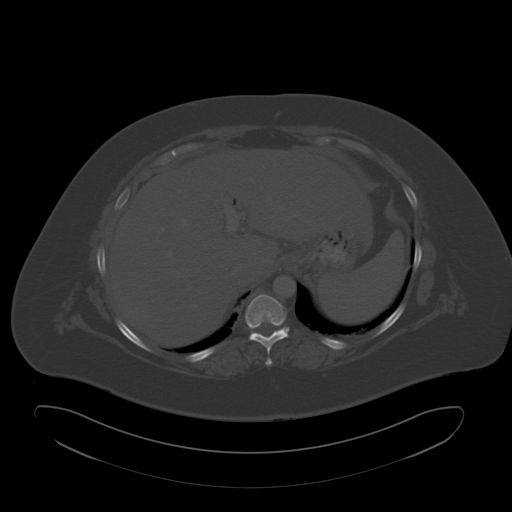
[im 87/105  soft-tissue]
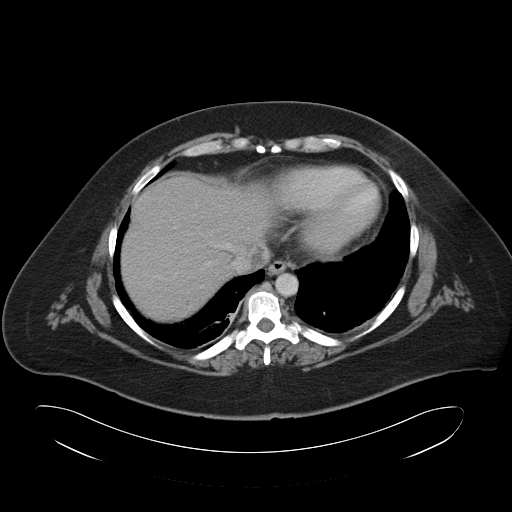
[im 87/105  lung]
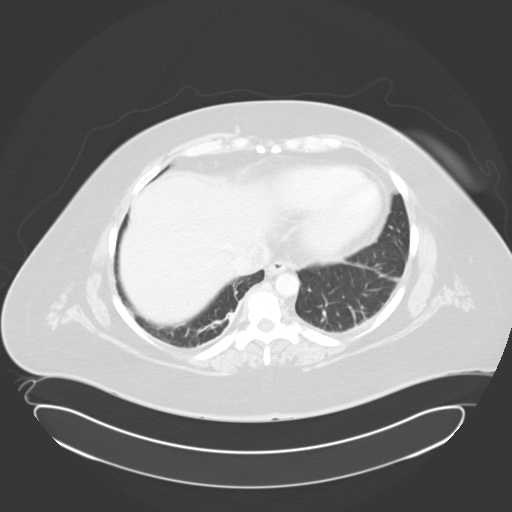
[im 93/105  lung]
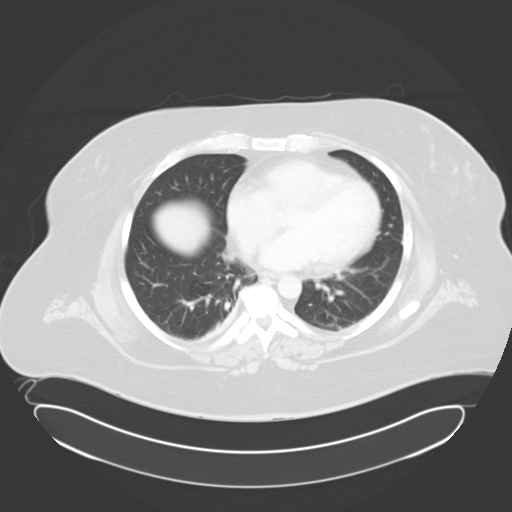
[im 99/105  soft-tissue]
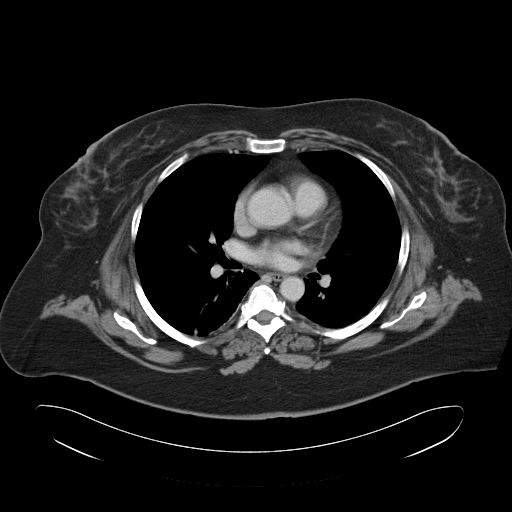
[im 99/105  lung]
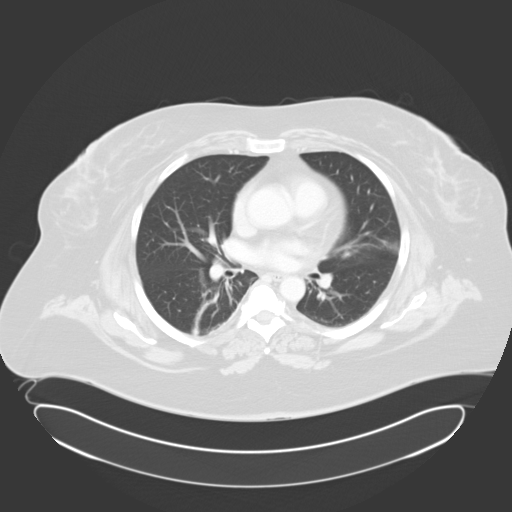

[12 of 32 positions shown; findings below may reference images not displayed]

FINDINGS: Post cholecystectomy. Extending from the gallbladder fossa/operative
site anteriorly and inferiorly is loculated 7.9 x 6.2 x 6.3 cm fluid
and gas collection. This is consistent with bile leak. The presence
of gas may reflect changes of an abscess as versus injected gas from
stent placement. A percutaneous drain has been placed from the
right. The tip of the drain is inferior and posterior to majority of
the above described postoperative collection. Correlation with
drainage amount recommended to determine if this needs to be
repositioned.

Stent extends from the distal common bile duct through the
pancreatic head into the second portion of the duodenum. Common bile
duct measures up to 8.5 mm. Minimal prominence major intrahepatic
biliary ducts without branch vessel dilation.

Enlarged liver spanning over 23 cm containing cysts. Splenomegaly
spanning over 18.6 cm length. Portal vein and prominent splenic
veins are patent.

Peripancreatic adenopathy with shotty intra-aortic caval lymph nodes
of questionable etiology.

Small amount of free fluid in the pelvis may be related to the bile
leak.

No definitive primary bowel inflammatory process.

Subcutaneous changes from surgery and STIR percutaneous catheter
noted.

No worrisome pancreatic, adrenal or renal lesion.

Mild calcification lower abdominal aorta are consistent with
slightly age advanced atherosclerotic type changes. No abdominal
aortic aneurysm.

Degenerative changes lower thoracic and lumbar spine with scattered
protrusion some which are calcified. Largest protrusion T10-11 level
with mass effect upon the cord. This can be assessed with follow-up
MR when patient is able. Spinal stenosis lower lumbar region. Facet
joint degenerative changes.

Noncontrast filled views of the urinary bladder unremarkable. Post
hysterectomy.

Bibasilar subsegmental atelectatic changes greater on the right.

Ascending thoracic aorta measures 4.2 cm. Recommend annual imaging
followup by CTA or MRA. This recommendation follows 2121
ACCF/AHA/AATS/ACR/ASA/SCA/MARJIE/OFELIA/RAANEE/MOLDEN Guidelines for the
Diagnosis and Management of Patients with Thoracic Aortic Disease.
Circulation. 2121; 121: [id number].
IMPRESSION: Post cholecystectomy. Extending from the gallbladder fossa/operative
site anteriorly and inferiorly is loculated 7.9 x 6.2 x 6.3 cm fluid
and gas collection. This is consistent with bile leak. The presence
of gas may reflect changes of an abscess as versus injected gas from
stent placement. A percutaneous drain has been placed from the
right. The tip of the drain is inferior and posterior to majority of
the above described postoperative collection. Correlation with
drainage amount recommended to determine if this needs to be
repositioned.

Stent extends from the distal common bile duct through the
pancreatic head into the second portion of the duodenum. Common bile
duct measures up to 8.5 mm. Minimal prominence major intrahepatic
biliary ducts without branch vessel dilation.

Enlarged liver spanning over 23 cm. Splenomegaly spanning over
cm length.

Peripancreatic adenopathy with shotty intra-aortic caval lymph nodes
of questionable etiology.

Small amount of free fluid in the pelvis may be related to the bile
leak..

Degenerative changes lower thoracic and lumbar spine with scattered
protrusion some which are calcified. Largest protrusion T10-11 level
with mass effect upon the cord. This can be assessed with follow-up
MR when patient is able. Spinal stenosis lower lumbar region.

Ascending thoracic aorta measures 4.2 cm. Recommend annual imaging
followup by CTA or MRA. This recommendation follows 2121
ACCF/AHA/AATS/ACR/ASA/SCA/MARJIE/OFELIA/RAANEE/MOLDEN Guidelines for the
Diagnosis and Management of Patients with Thoracic Aortic Disease.
Circulation. 2121; 121: [id number].

## 2015-12-06 IMAGING — CT CT CORE BIOPSY RENAL
1 of 4 series · 14 of 32 positions shown, 19 images · non-contrast
Comparison: CT scan of July 22, 2014.

INDICATION: Postoperative fluid collection in right upper quadrant of abdomen.

EXAM:
CT CORE BIOPSY RENAL
TECHNIQUE: Informed written consent was obtained from AVANT, GEOVANI after a
discussion of the risks, benefits and alternatives to treatment. The
patient was placed supine on the CT gantry and a pre procedural CT
was performed re-demonstrating the known abscess/fluid collection
within the subhepatic space. The procedure was planned. A timeout
was performed prior to the initiation of the procedure.

[Series 2: routine abdomen · axial · 0.82mm/px · z∈[+638,+848]mm · 14 of 48 slices shown, 19 images]
[im 3/48  soft-tissue]
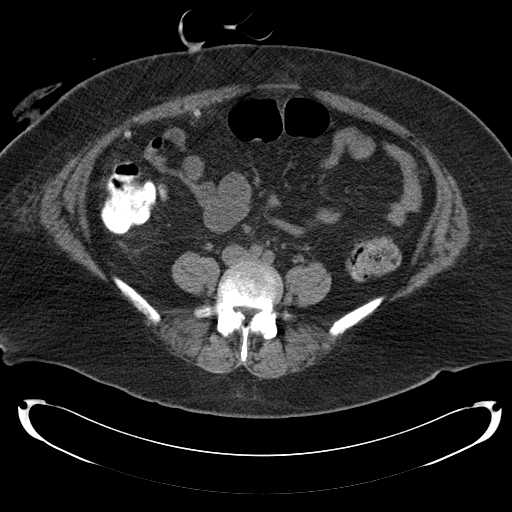
[im 3/48  bone]
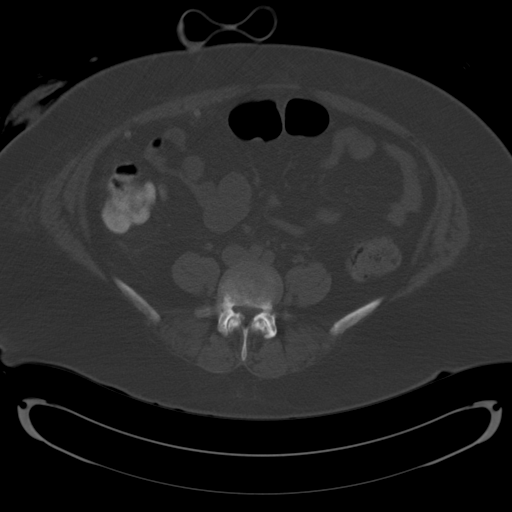
[im 6/48  soft-tissue]
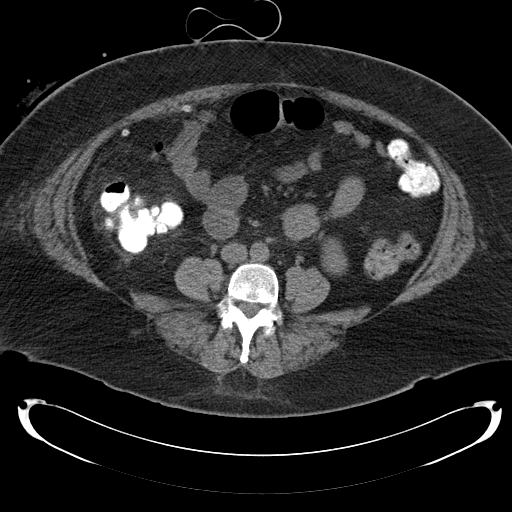
[im 9/48  soft-tissue]
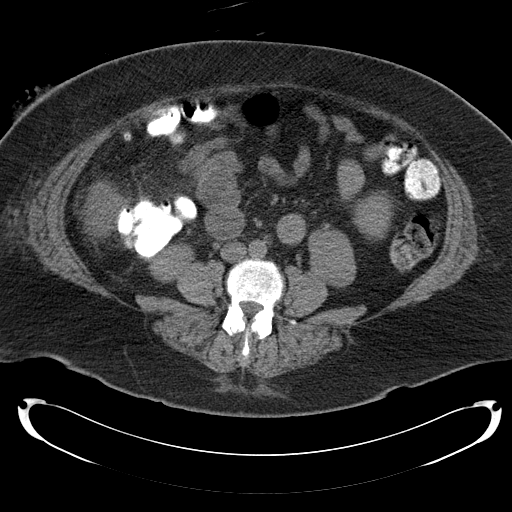
[im 15/48  soft-tissue]
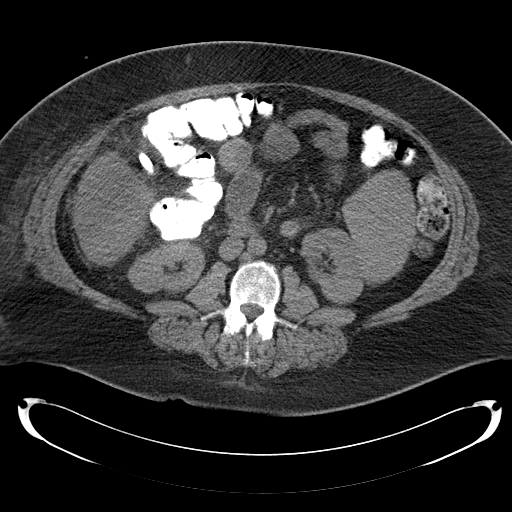
[im 18/48  soft-tissue]
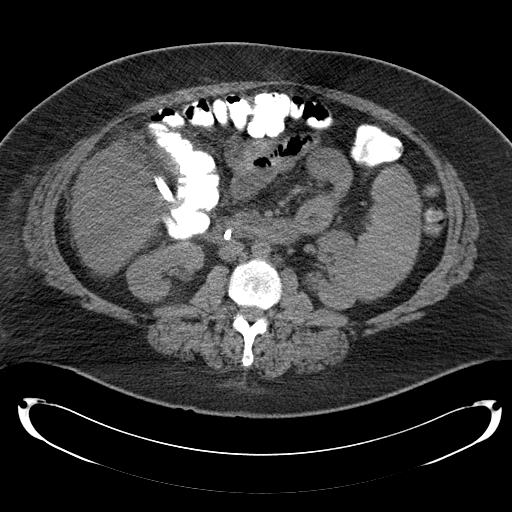
[im 21/48  soft-tissue]
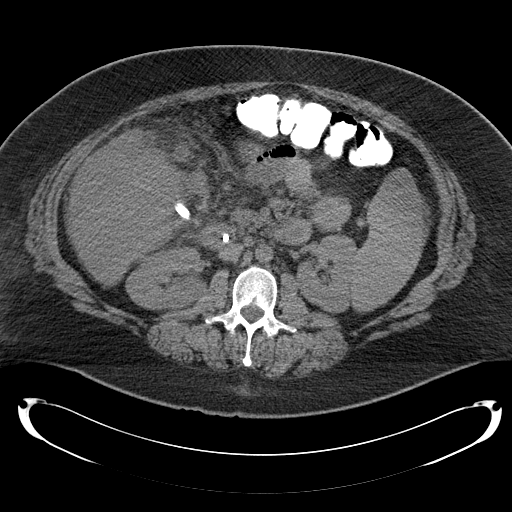
[im 24/48  soft-tissue]
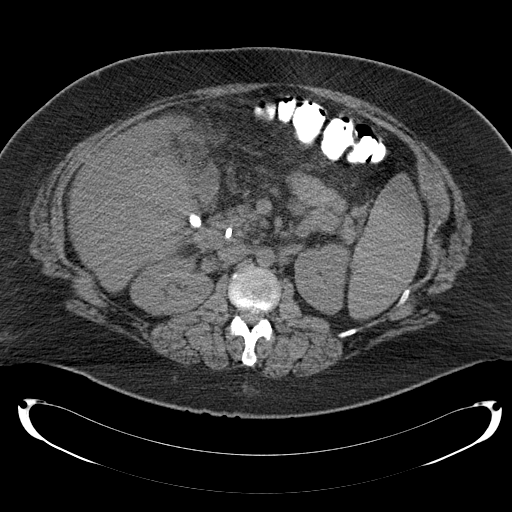
[im 27/48  soft-tissue]
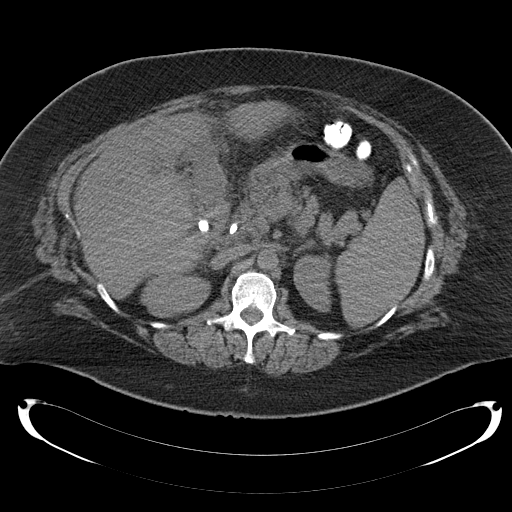
[im 30/48  soft-tissue]
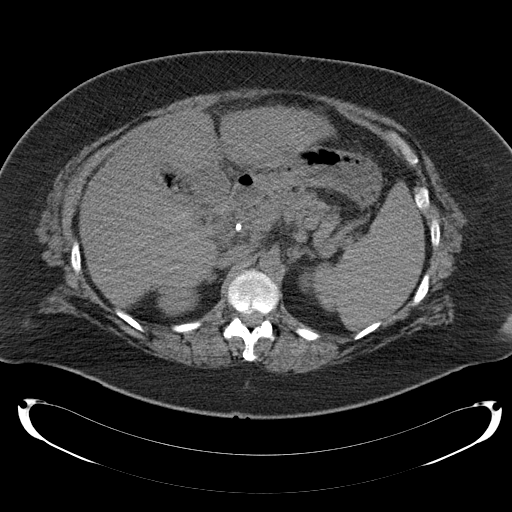
[im 30/48  bone]
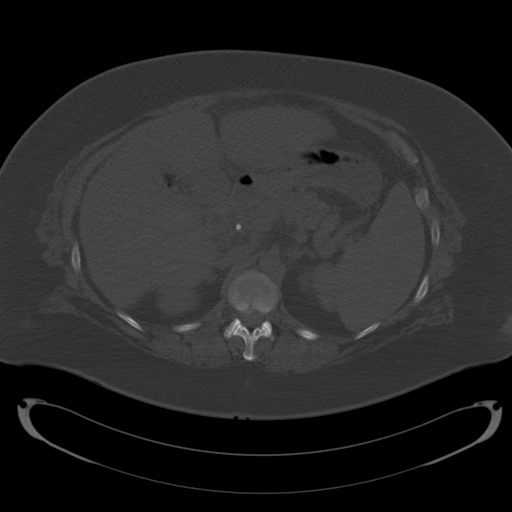
[im 33/48  soft-tissue]
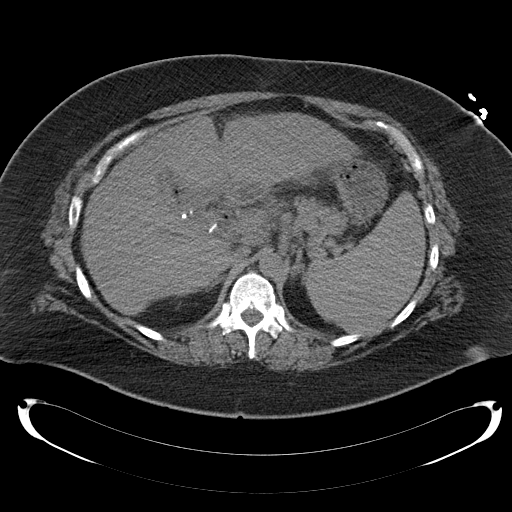
[im 36/48  lung]
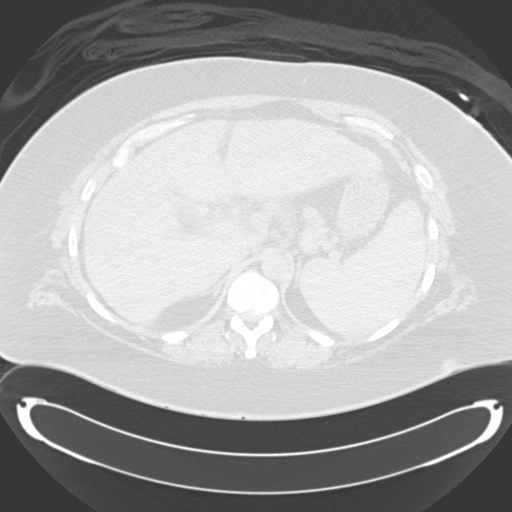
[im 39/48  soft-tissue]
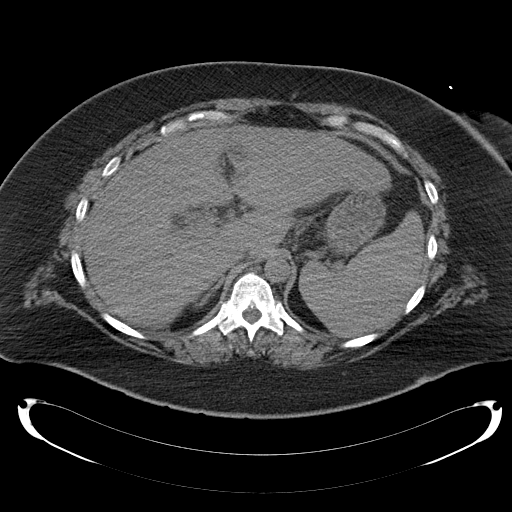
[im 39/48  lung]
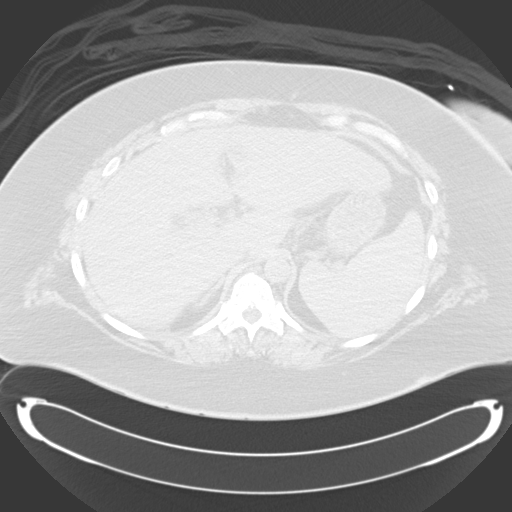
[im 42/48  soft-tissue]
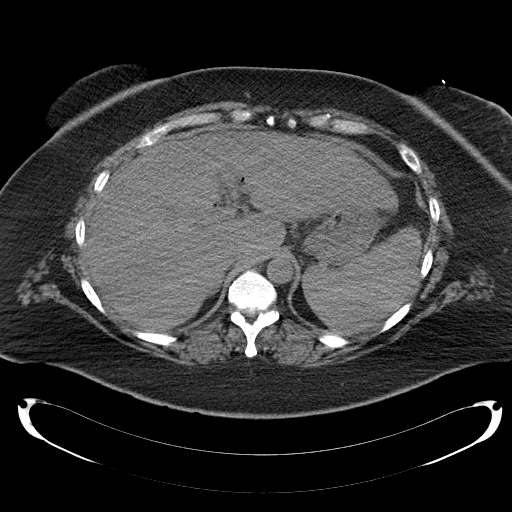
[im 42/48  lung]
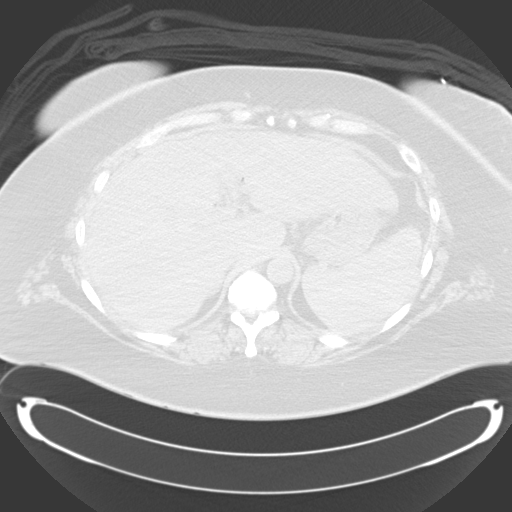
[im 45/48  soft-tissue]
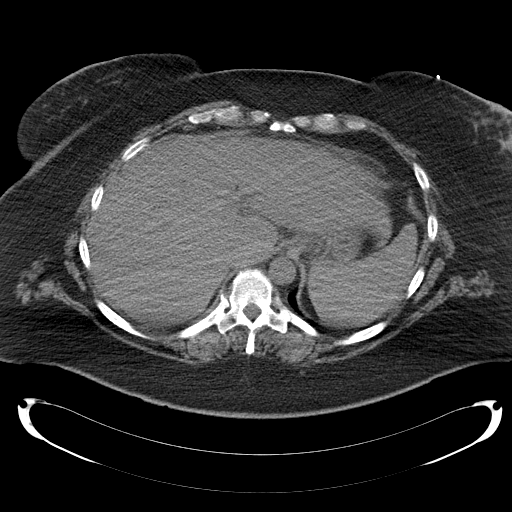
[im 45/48  lung]
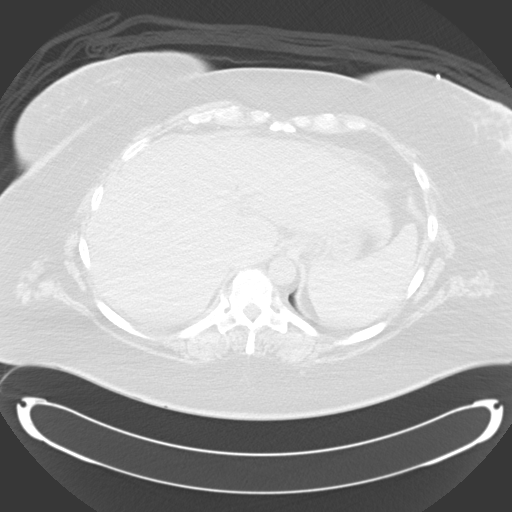

[14 of 32 positions shown; findings below may reference images not displayed]

MEDICATIONS:
Fentanyl 150 mcg IV; Versed 3.0 mg IV

ANESTHESIA/SEDATION:
Total Moderate Sedation Time

35 minutes

CONTRAST:  None.

COMPLICATIONS:
None immediate.
The epigastric region was prepped and draped in the usual sterile
fashion. The overlying soft tissues were anesthetized with 1%
lidocaine with epinephrine. Appropriate trajectory was planned with
the use of a 22 gauge spinal needle. An 18 gauge trocar needle was
advanced into the abscess/fluid collection and a short Amplatz super
stiff wire was coiled within the collection. Appropriate positioning
was confirmed with a limited CT scan. The tract was serially dilated
allowing placement of a {10} French all-purpose drainage catheter.
Appropriate positioning was confirmed with a limited postprocedural
CT scan.

Ten ml of {serous} fluid was aspirated. The tube was connected to a
{JIM suction bulb} and secured in place. A dressing was placed. The
patient tolerated the procedure well without immediate post
procedural complication.
IMPRESSION: Successful CT guided placement of a 10 French all purpose drain
catheter into the {subhepatic fluid collection} with aspiration of
10 mL of serous fluid.

## 2015-12-08 IMAGING — US US PELVIS COMPLETE
1 series · 13 of 25 positions shown · non-contrast
Comparison: CT scan of the abdomen and pelvis of May 29, 2014.

CLINICAL DATA: Follow-up of ovarian cysts seen on CT scan May 29, 2014, history of hysterectomy 28 years ago.

EXAM:
TRANSABDOMINAL AND TRANSVAGINAL ULTRASOUND OF PELVIS
TECHNIQUE: Both transabdominal and transvaginal ultrasound examinations of the
pelvis were performed. Transabdominal technique was performed for
global imaging of the pelvis including uterus, ovaries, adnexal
regions, and pelvic cul-de-sac. It was necessary to proceed with
endovaginal exam following the transabdominal exam to visualize the
endometrium and adnexal structures.

[Series 1: us pelvis complete · 0.24mm/px · 97 acquisitions, 13 frames shown]
[im 1/97]
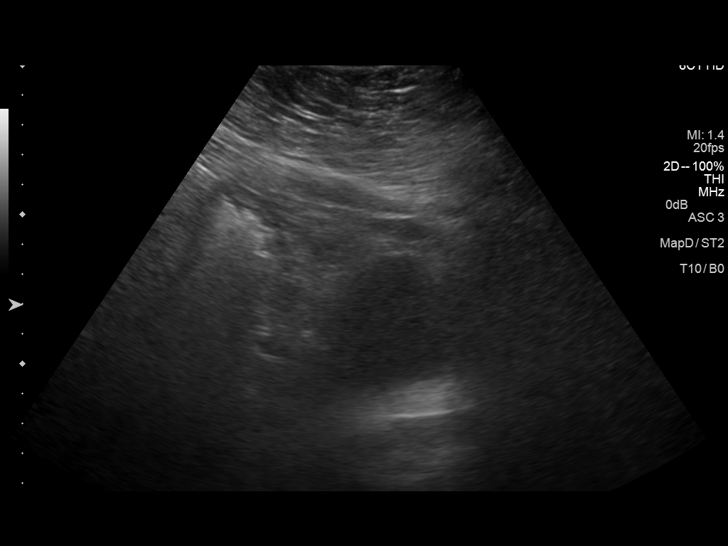
[im 9/97]
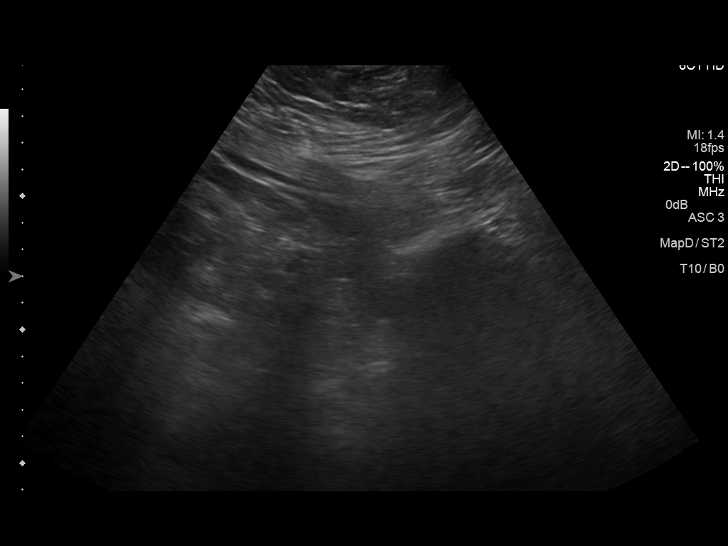
[im 17/97]
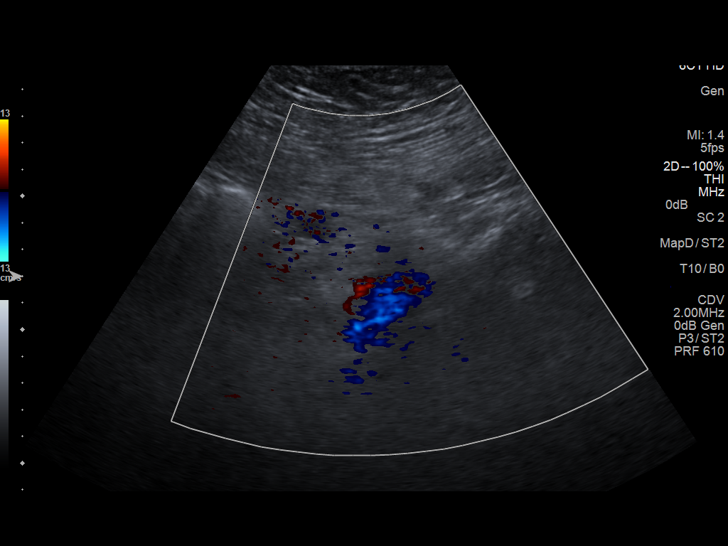
[im 25/97]
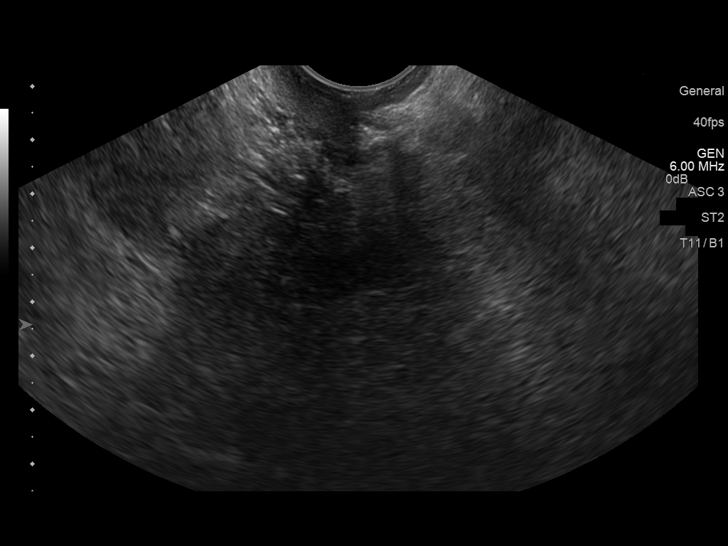
[im 33/97]
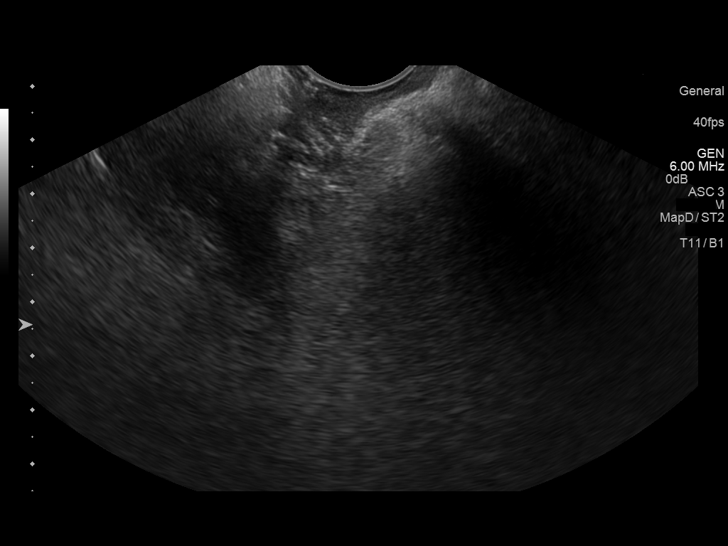
[im 41/97]
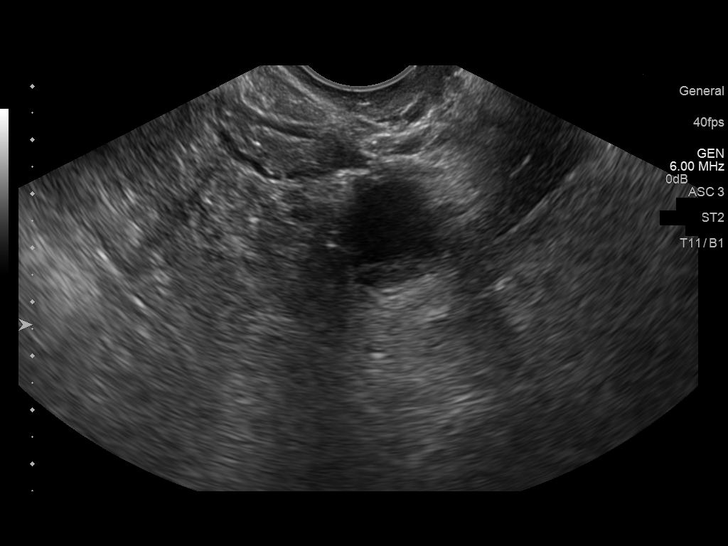
[im 49/97]
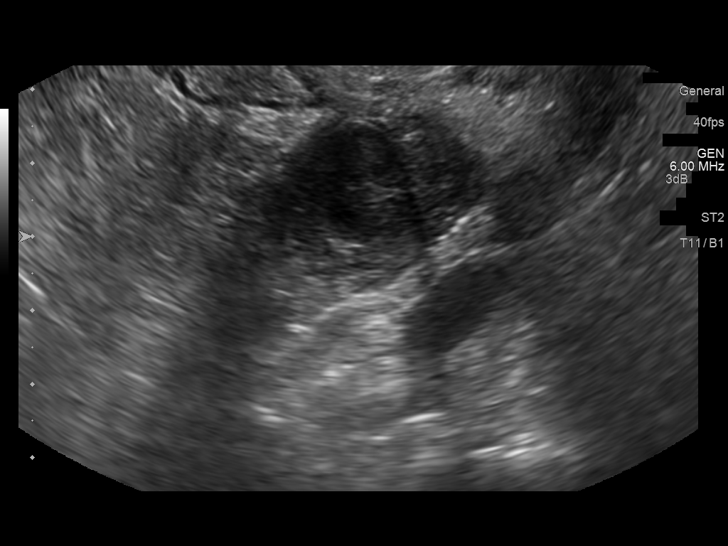
[im 57/97]
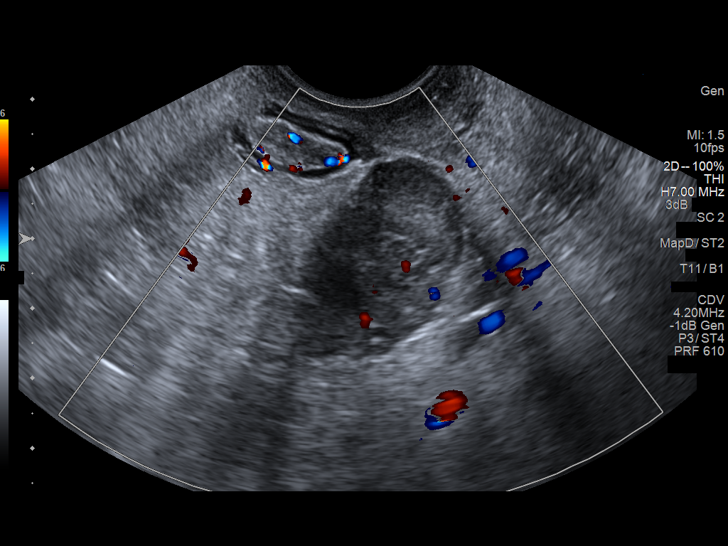
[im 65/97]
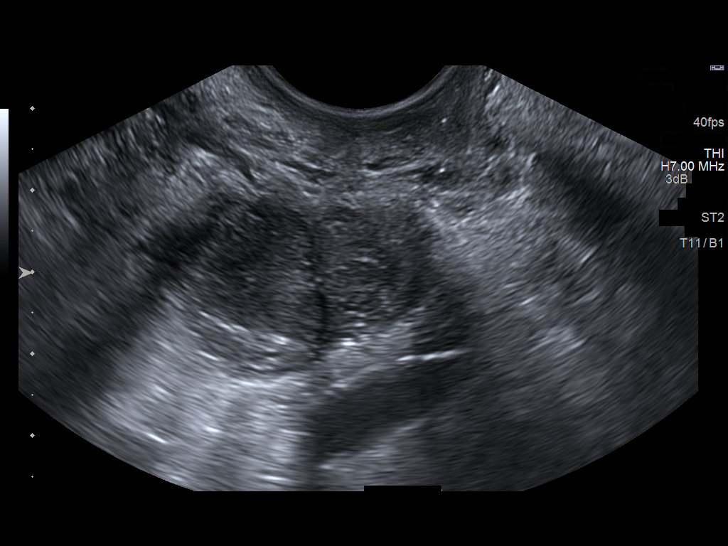
[im 73/97]
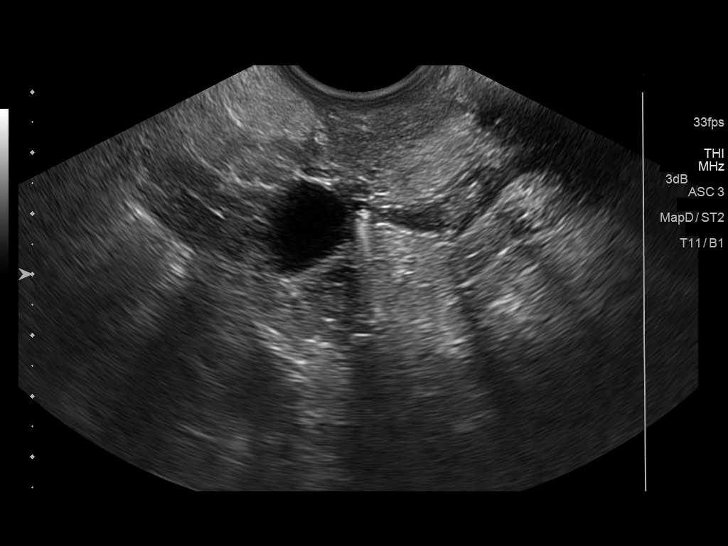
[im 81/97]
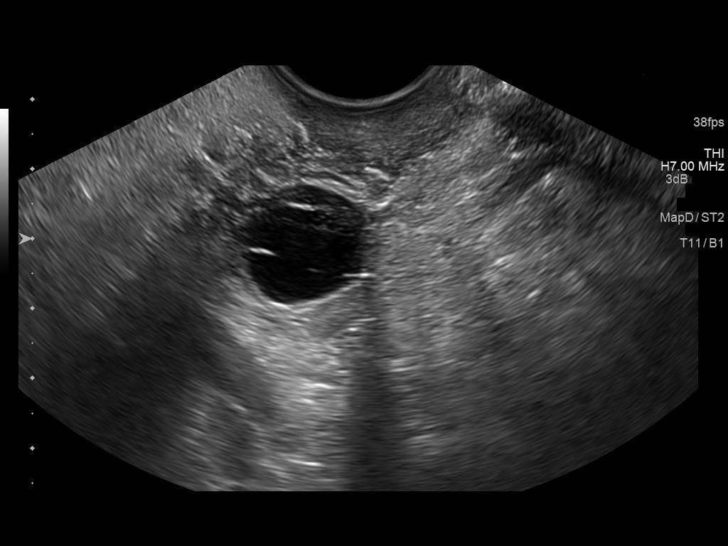
[im 89/97]
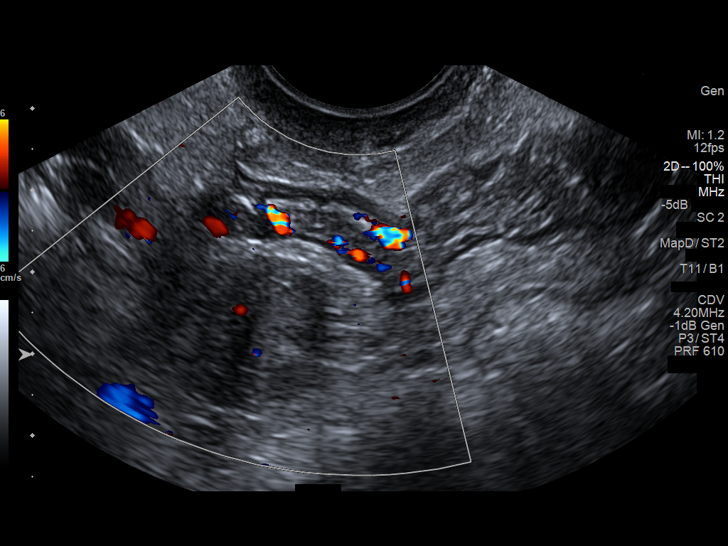
[im 97/97]
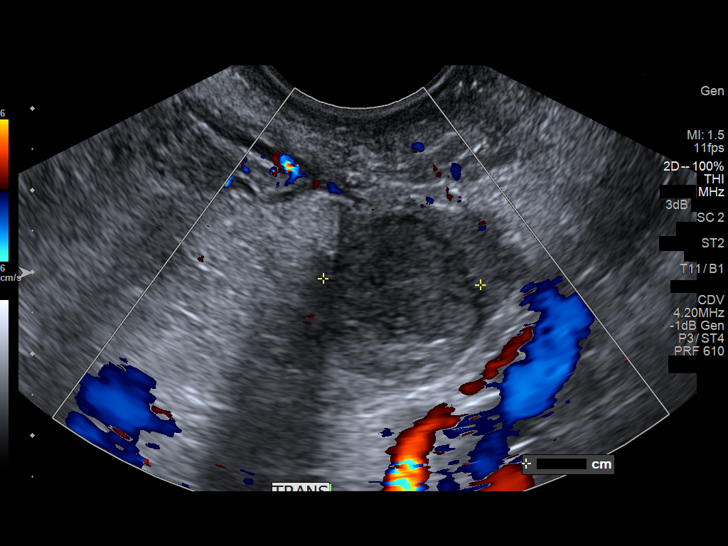

[13 of 25 positions shown; findings below may reference images not displayed]

FINDINGS: Uterus

The uterus is surgically absent.

Right ovary

Measurements: 3.3 x 1.9 x 2.3 cm. There is a septated right ovarian
cystic structure which measures 1.8 x 1.5 x 1.7 cm.

Left ovary

Measurements: 4.1 x 2.4 x 2.5 cm. There is an ovarian cyst measuring
1.8 cm in greatest dimension. There is a subtle focus of
heterogeneously increased echotexture measuring 3 x 1.7 x 1.9 cm.

Other findings

There is no free pelvic fluid.
IMPRESSION: The uterus is surgically absent. The right ovary contains a cyst
measuring 1.8 cm in greatest dimension. On the left the ovary
contains a cyst measuring up to 1.8 cm, and there is a complex
structure within the left ovary measuring 3 x 1.7 x 1.9 cm. This was
not evident on the previous study. Pelvic MRI is recommended.

## 2015-12-10 IMAGING — US US ABDOMEN LIMITED
1 series · 13 of 25 positions shown · non-contrast
Comparison: Ultrasound March 27, 2014.

CLINICAL DATA: Right upper quadrant abdominal pain. Elevated
bilirubin level.

EXAM:
US ABDOMEN LIMITED - RIGHT UPPER QUADRANT

[Series 1: us abdomen limited · 0.28mm/px · 13 of 39 slices shown]
[im 1/39]
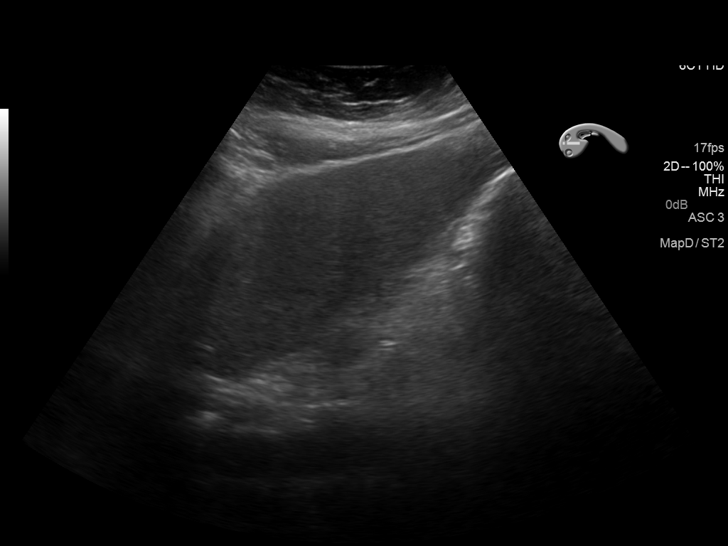
[im 4/39]
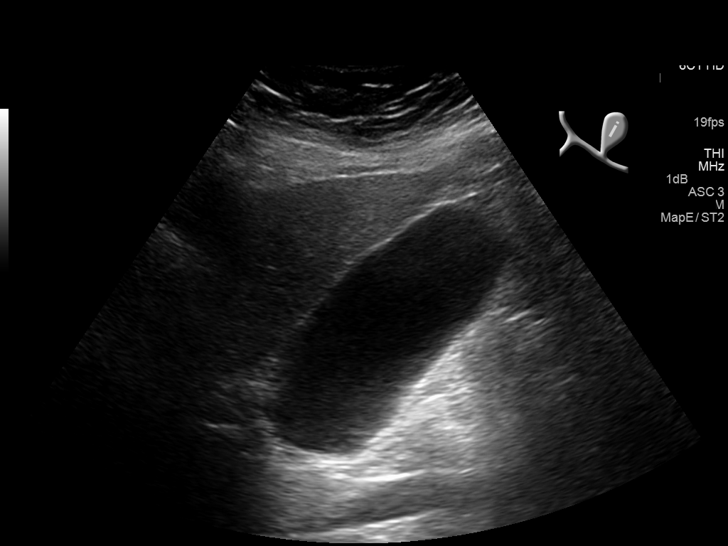
[im 7/39]
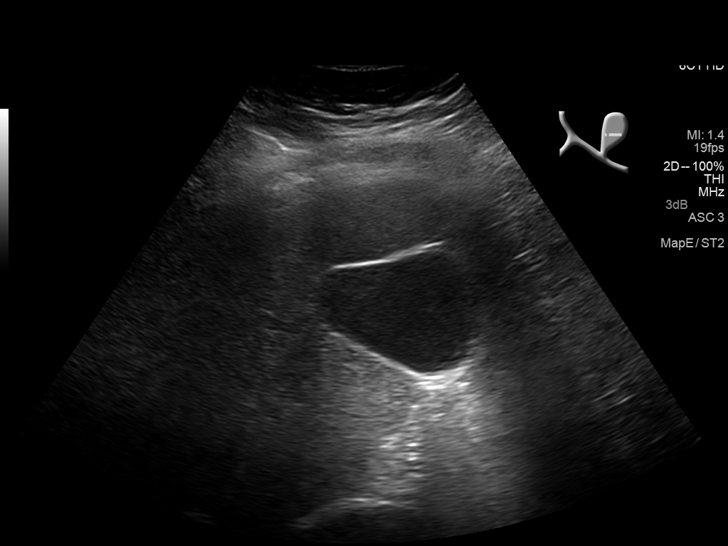
[im 10/39]
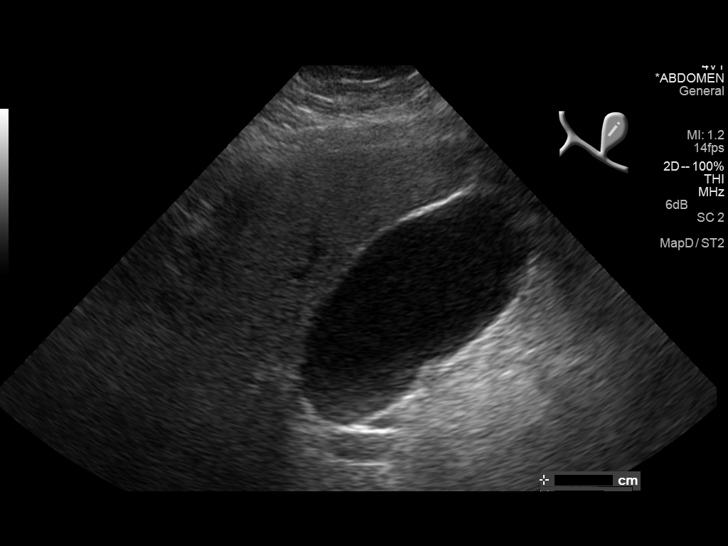
[im 13/39]
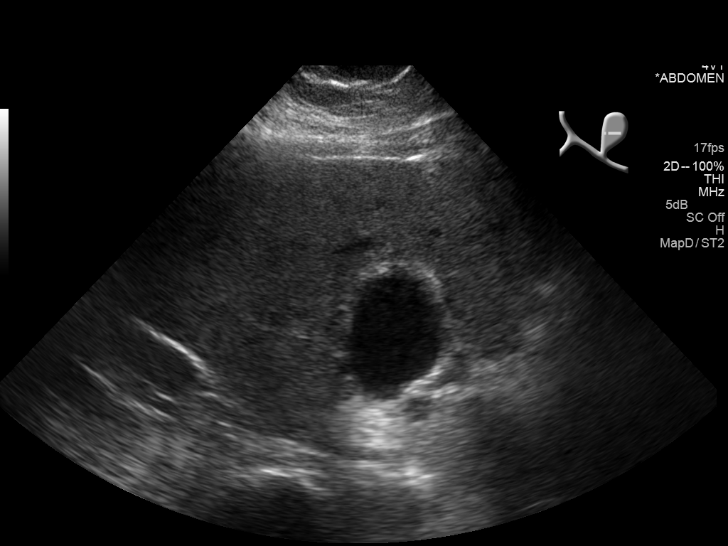
[im 16/39]
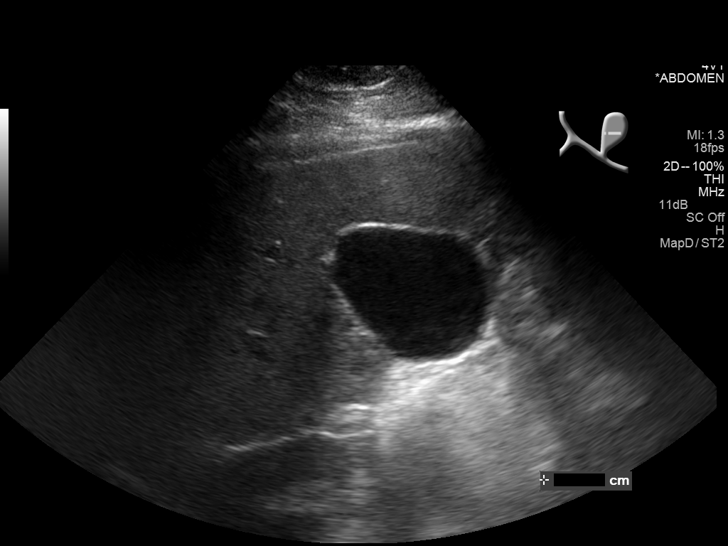
[im 20/39]
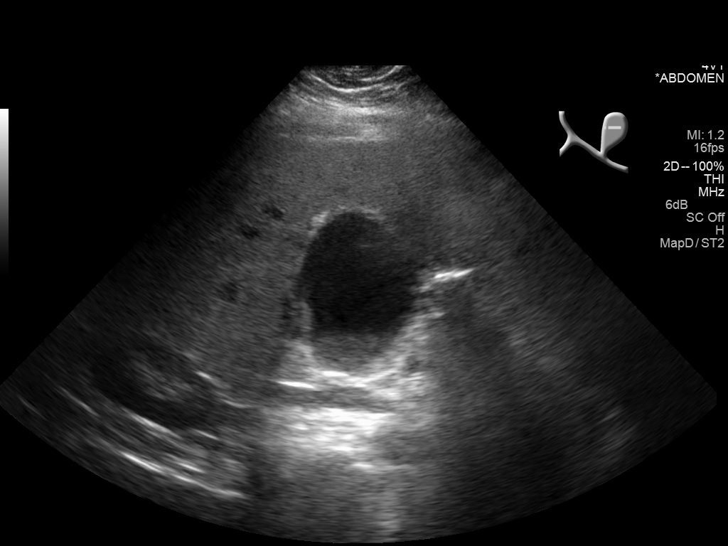
[im 23/39]
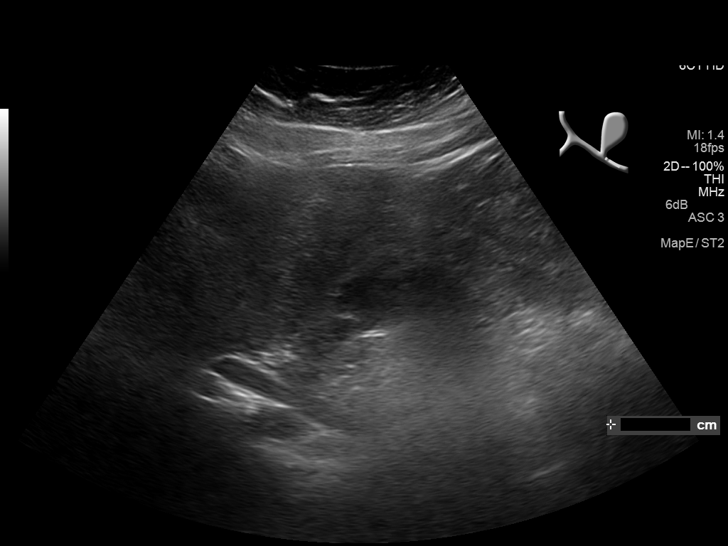
[im 26/39]
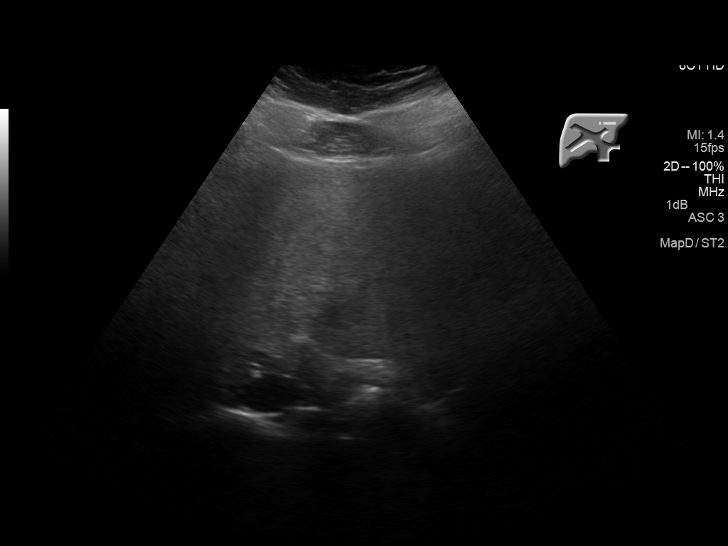
[im 29/39]
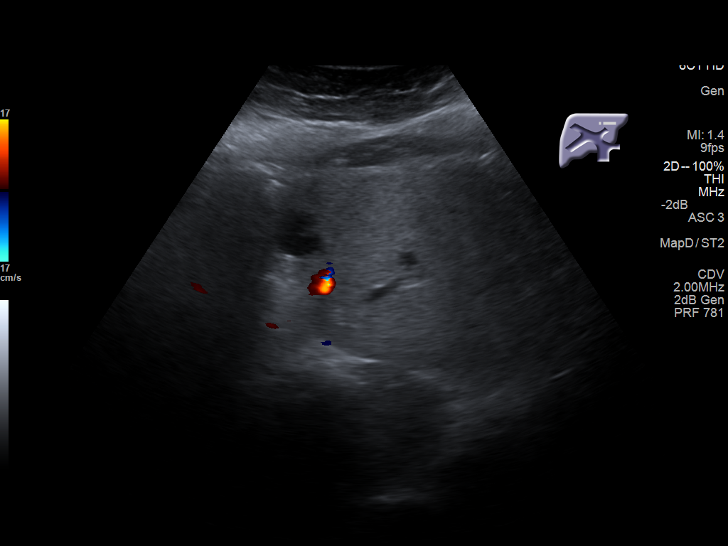
[im 32/39]
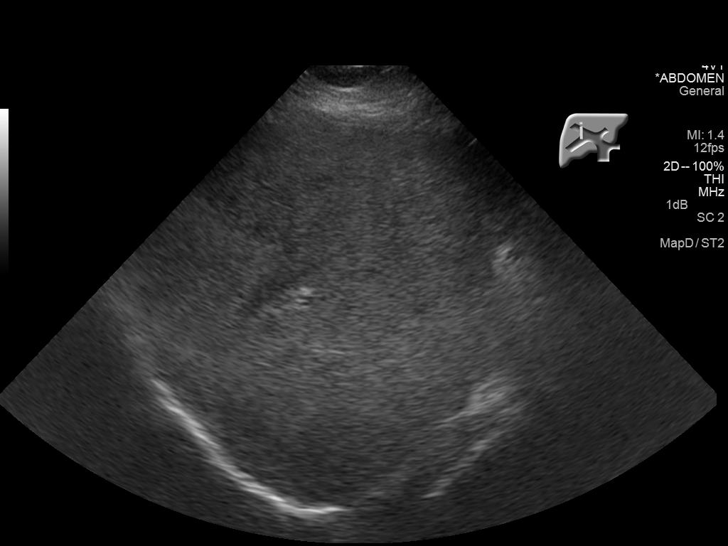
[im 35/39]
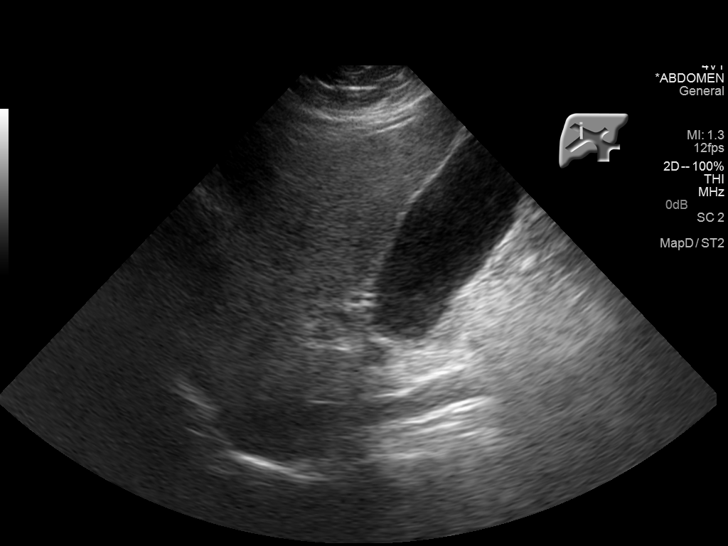
[im 39/39]
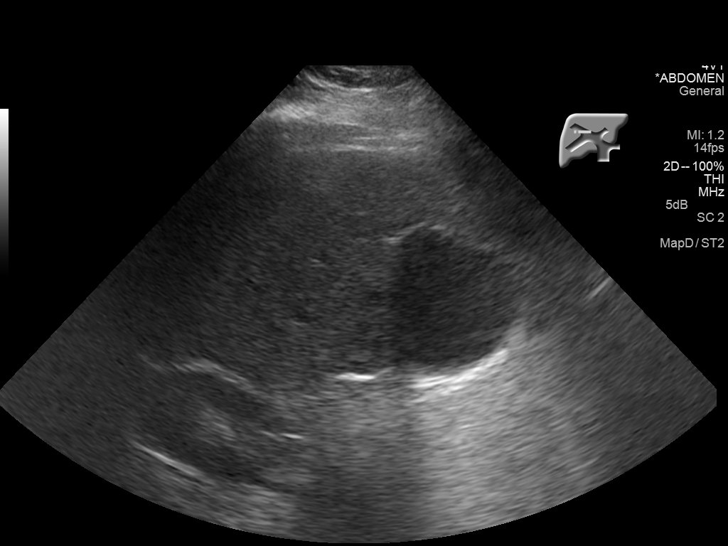

[13 of 25 positions shown; findings below may reference images not displayed]

FINDINGS: Gallbladder:

Gallbladder appears to be mildly distended with mild amount of
sludge present. No gallstones are noted. No gallbladder wall
thickening or pericholecystic fluid is noted. Positive sonographic
Murphy sign is noted.

Common bile duct:

Diameter: Common bile duct measures 9.1 mm concerning for distal
common bile duct obstruction, particularly the stated history of
elevated bilirubin level.

Liver:

Increased echogenicity of hepatic parenchyma is noted suggesting
fatty infiltration. Stable bilobed 1.9 cm left hepatic cyst is
noted.
IMPRESSION: Stable left hepatic cyst.

Increased echogenicity of hepatic parenchyma consistent with fatty
infiltration.

Common bile duct is dilated concerning for distal common bile duct
obstruction given the history of elevated bilirubin level. MRCP is
recommended for further evaluation.

Mild distention of gallbladder is noted with sludge present.
Positive sonographic Murphy's sign is noted. HIDA scan is
recommended to evaluate for possible cholecystitis.

## 2015-12-14 NOTE — Anesthesia Postprocedure Evaluation (Signed)
Anesthesia Post Note  Patient: Lori Mckenzie  Procedure(s) Performed: Procedure(s) (LRB): ESOPHAGOGASTRODUODENOSCOPY (EGD) WITH PROPOFOL (N/A)  Patient location during evaluation: Endoscopy Anesthesia Type: General Level of consciousness: awake and alert Pain management: pain level controlled Vital Signs Assessment: post-procedure vital signs reviewed and stable Respiratory status: spontaneous breathing, nonlabored ventilation, respiratory function stable and patient connected to nasal cannula oxygen Cardiovascular status: blood pressure returned to baseline and stable Postop Assessment: no signs of nausea or vomiting Anesthetic complications: no    Last Vitals:  Vitals:   12/01/15 0928 12/01/15 0938  BP: 100/77 120/88  Pulse: 94 93  Resp: (!) 22 (!) 24  Temp:      Last Pain:  Vitals:   12/02/15 0753  TempSrc:   PainSc: 0-No pain                 Britaney Espaillat S

## 2015-12-14 NOTE — Addendum Note (Signed)
Addendum  created 12/14/15 0757 by Gunnar Bulla, MD   Sign clinical note

## 2015-12-17 IMAGING — US US EXTREM  UP VENOUS*L*
1 series · 13 of 24 positions shown · non-contrast
Comparison: None.

CLINICAL DATA: 49-year-old female with left upper extremity
swelling



[Series 1: us extrem up venous*left* · 0.10mm/px · 13 of 34 slices shown]
[im 1/34]
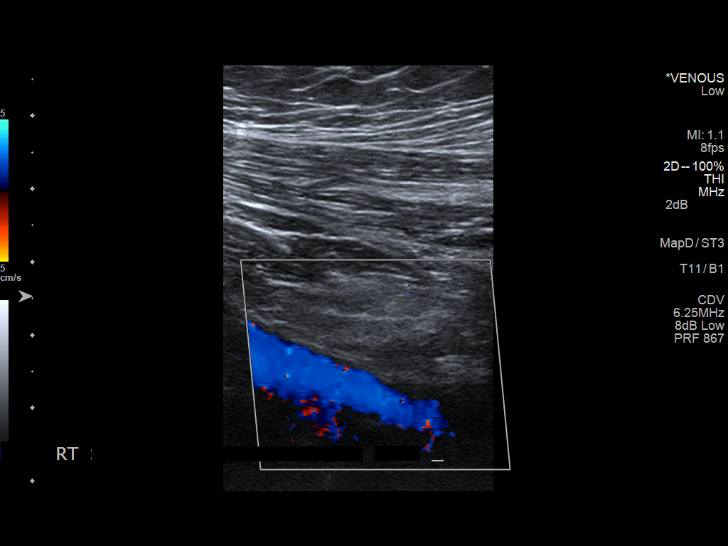
[im 3/34]
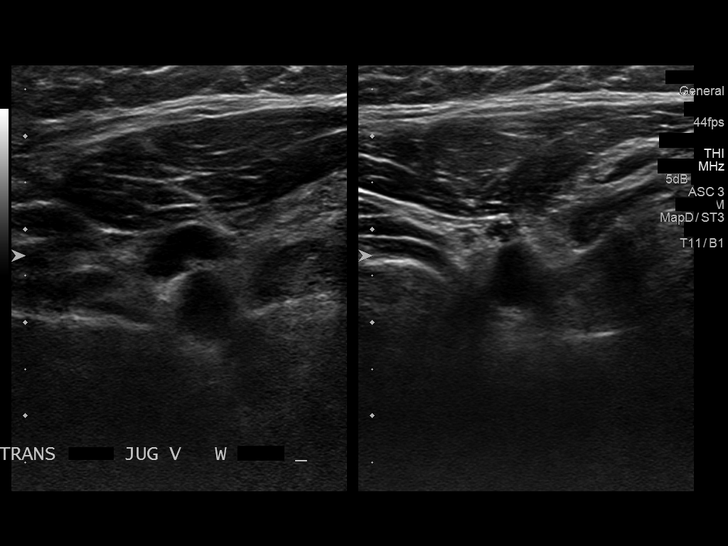
[im 6/34]
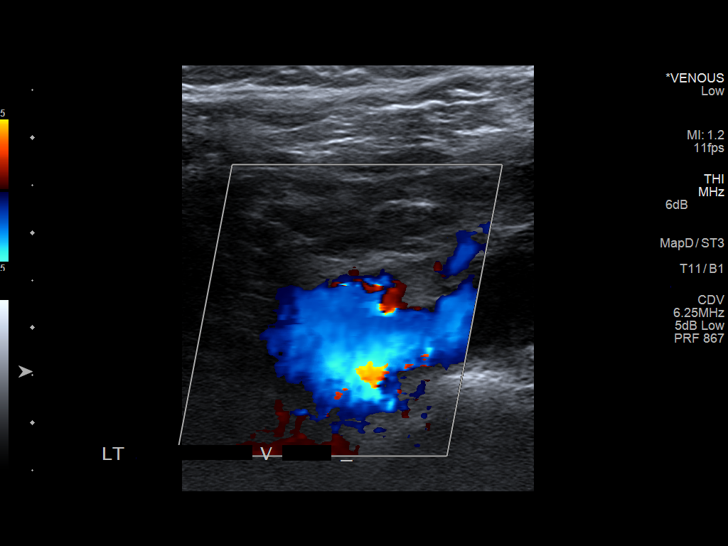
[im 9/34]
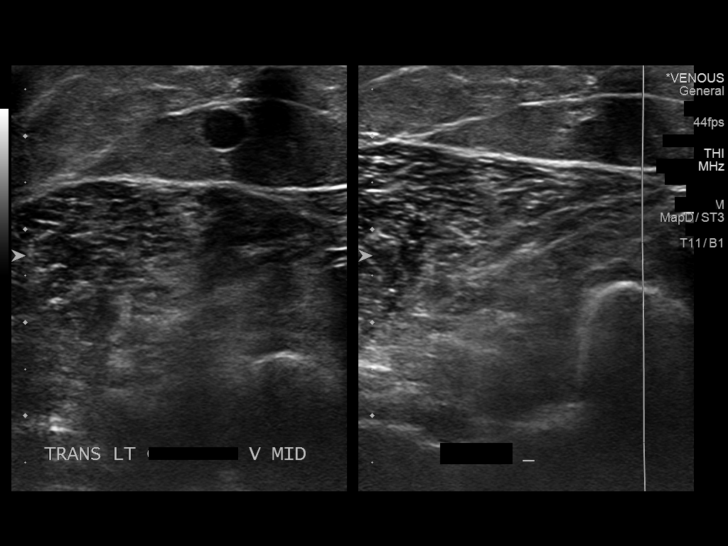
[im 12/34]
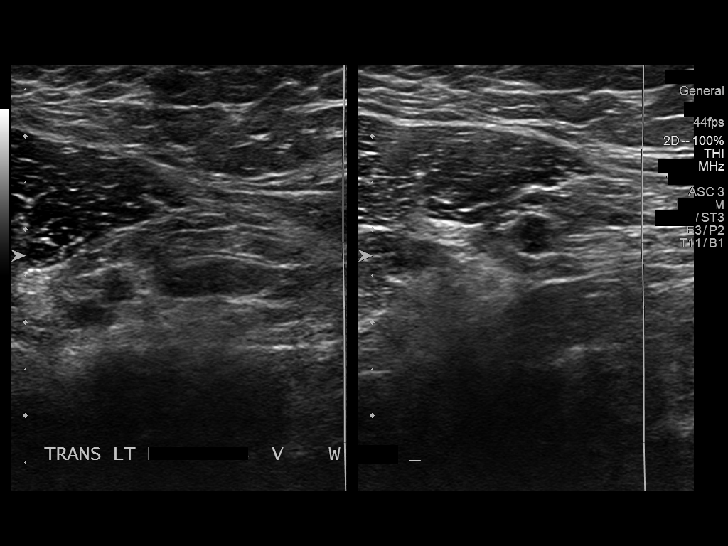
[im 15/34]
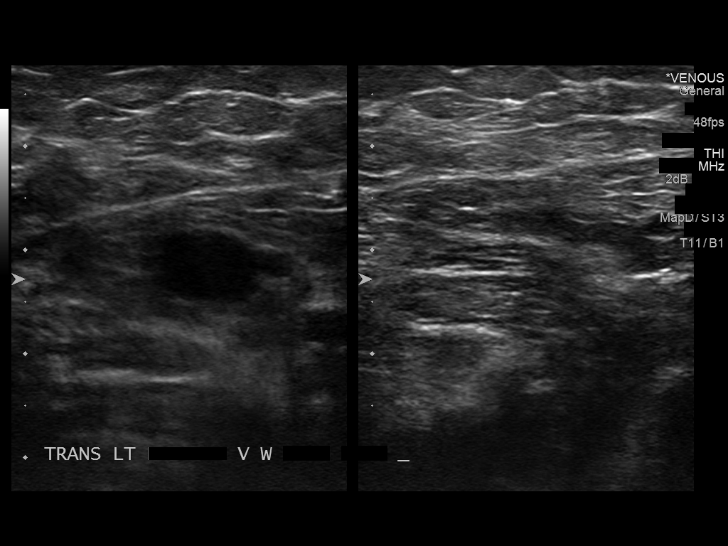
[im 18/34]
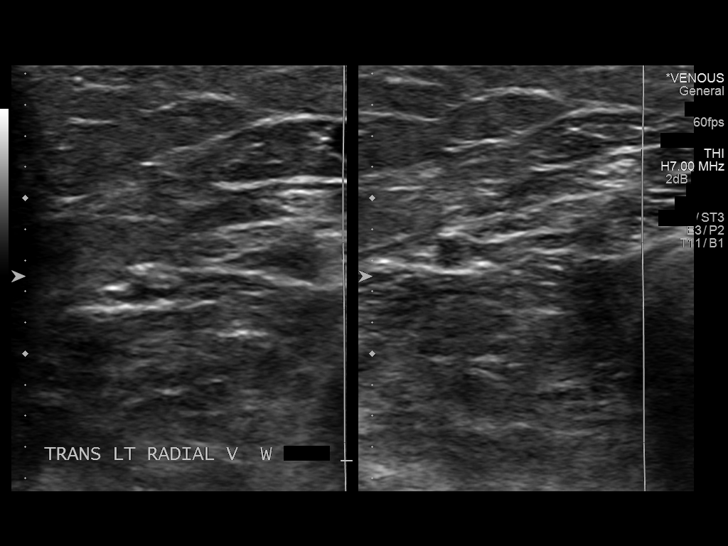
[im 19/34]
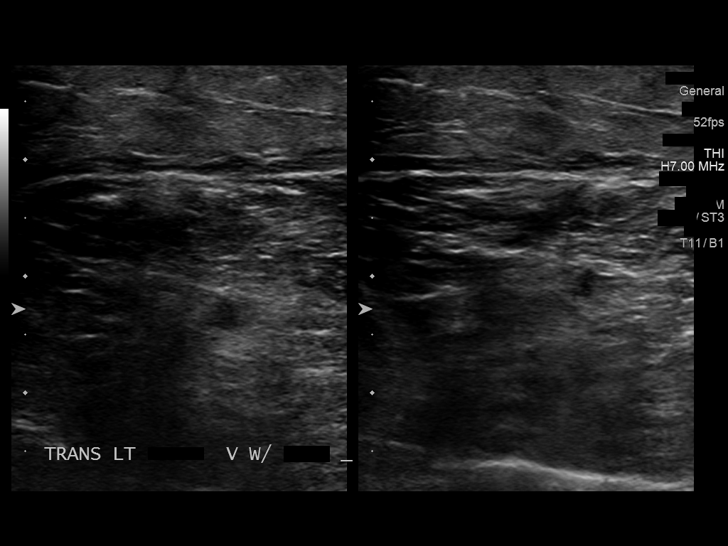
[im 22/34]
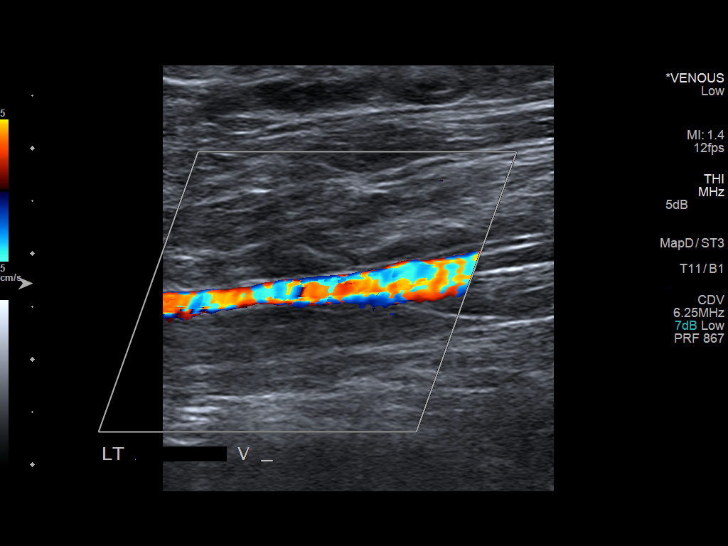
[im 25/34]
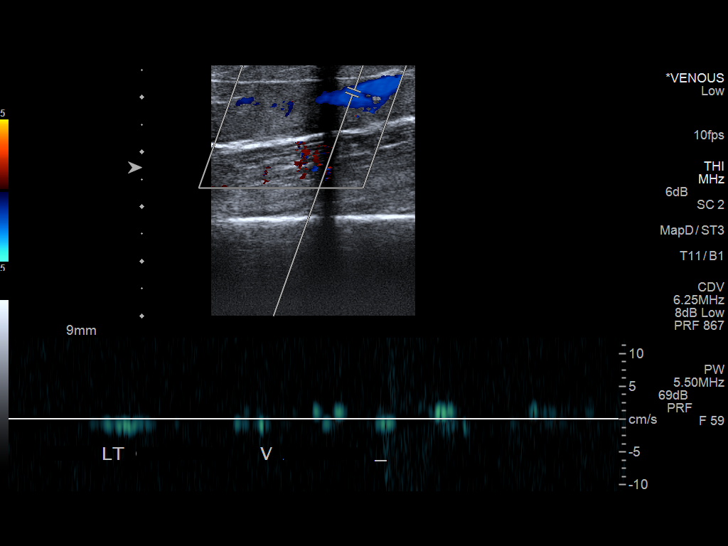
[im 28/34]
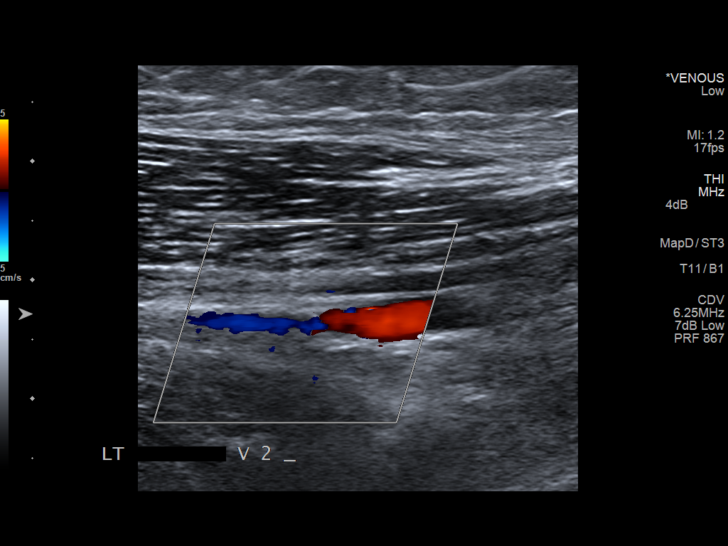
[im 31/34]
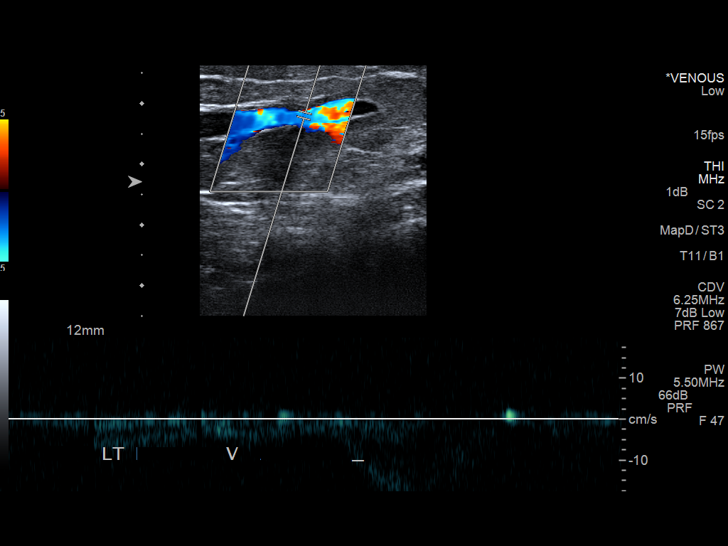
[im 34/34]
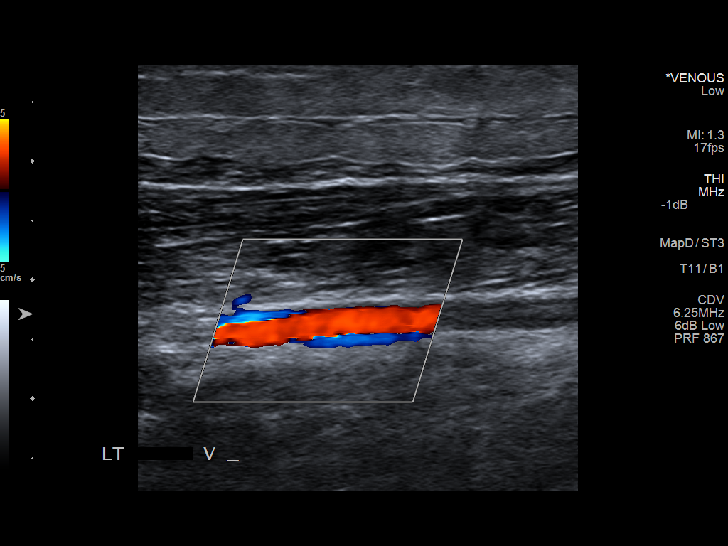

[13 of 24 positions shown; findings below may reference images not displayed]

FINDINGS: Contralateral Subclavian Vein: Respiratory phasicity is normal and
symmetric with the symptomatic side. No evidence of thrombus. Normal
compressibility.

Internal Jugular Vein: No evidence of thrombus. Normal
compressibility, respiratory phasicity and response to augmentation.

Subclavian Vein: No evidence of thrombus. Normal compressibility,
respiratory phasicity and response to augmentation.

Axillary Vein: No evidence of thrombus. Normal compressibility,
respiratory phasicity and response to augmentation.

Cephalic Vein: No evidence of thrombus. Normal compressibility,
respiratory phasicity and response to augmentation.

Basilic Vein: No evidence of thrombus. Normal compressibility,
respiratory phasicity and response to augmentation.

Brachial Veins: No evidence of thrombus. Normal compressibility,
respiratory phasicity and response to augmentation.

Radial Veins: No evidence of thrombus. Normal compressibility,
respiratory phasicity and response to augmentation.

Ulnar Veins: No evidence of thrombus. Normal compressibility,
respiratory phasicity and response to augmentation.

Venous Reflux:  None visualized.

Other Findings:  None visualized.
IMPRESSION: No evidence of deep venous thrombosis.

## 2015-12-17 IMAGING — CT CT ABD-PELV W/ CM
1 of 3 series · 14 of 32 positions shown, 19 images · IV contrast (omnipaque)
Comparison: 07/22/2014

CLINICAL DATA: CT-guided drainage done 07/23/2014. Fevers, nausea,
vomiting. Severe abdominal pain. Recent gallbladder surgery.

EXAM:
CT ABDOMEN AND PELVIS WITH CONTRAST
TECHNIQUE: Multidetector CT imaging of the abdomen and pelvis was performed
using the standard protocol following bolus administration of
intravenous contrast.
CONTRAST:  100mL OMNIPAQUE IOHEXOL 300 MG/ML  SOLN

[Series 2: routine abd pel with · axial · 0.93mm/px · z∈[-1072,-647]mm · 14 of 96 slices shown, 19 images]
[im 6/96  soft-tissue]
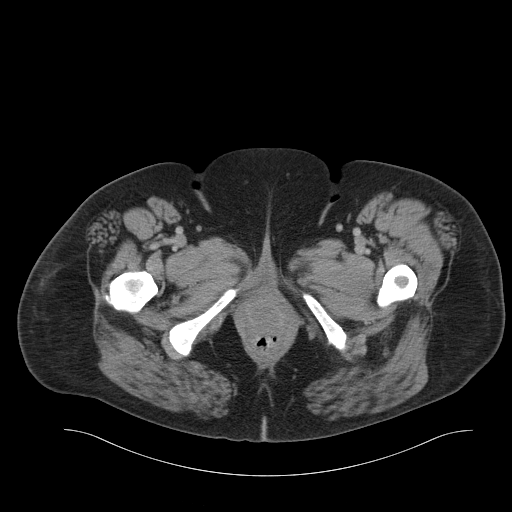
[im 6/96  bone]
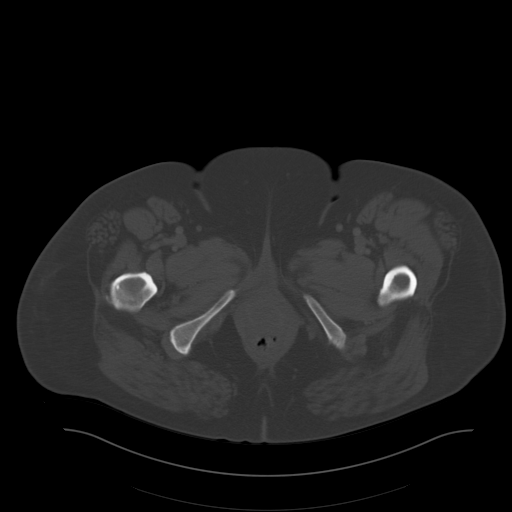
[im 16/96  soft-tissue]
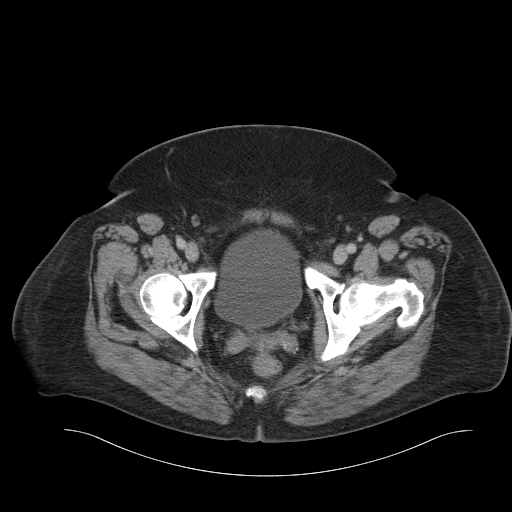
[im 21/96  soft-tissue]
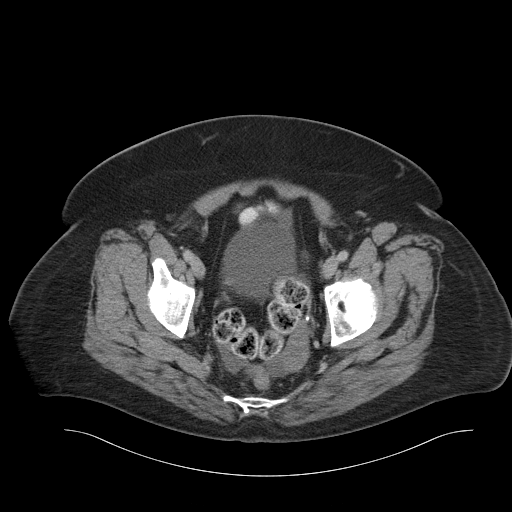
[im 26/96  soft-tissue]
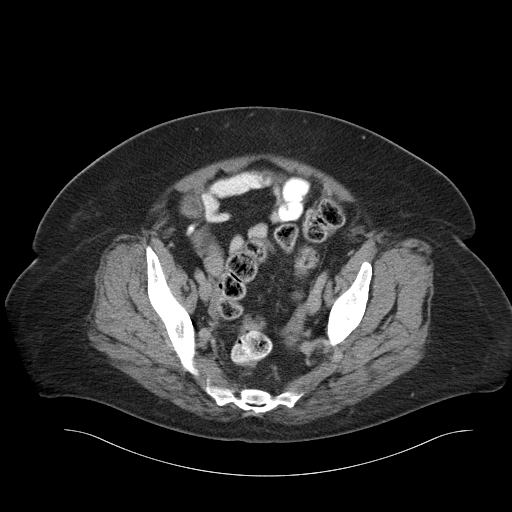
[im 36/96  soft-tissue]
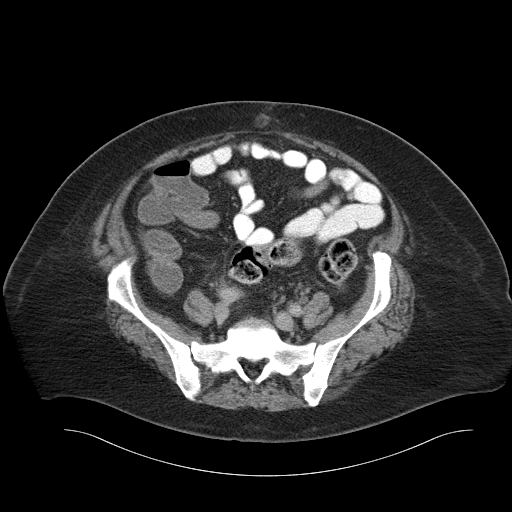
[im 41/96  soft-tissue]
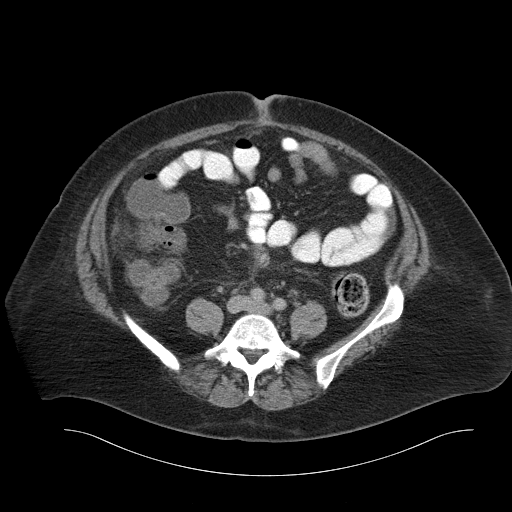
[im 51/96  soft-tissue]
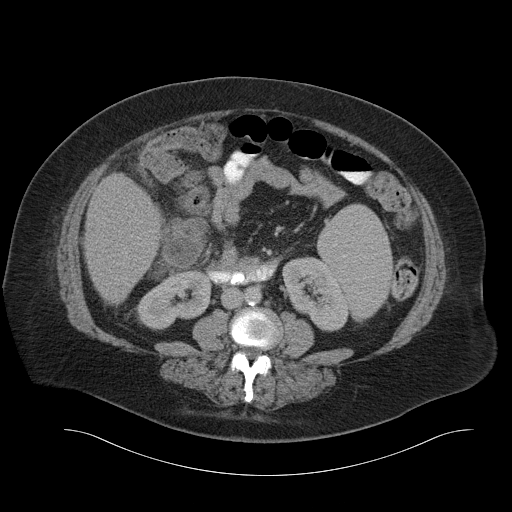
[im 56/96  soft-tissue]
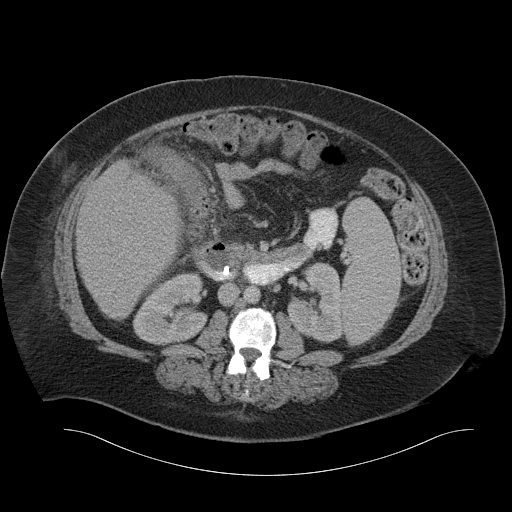
[im 61/96  soft-tissue]
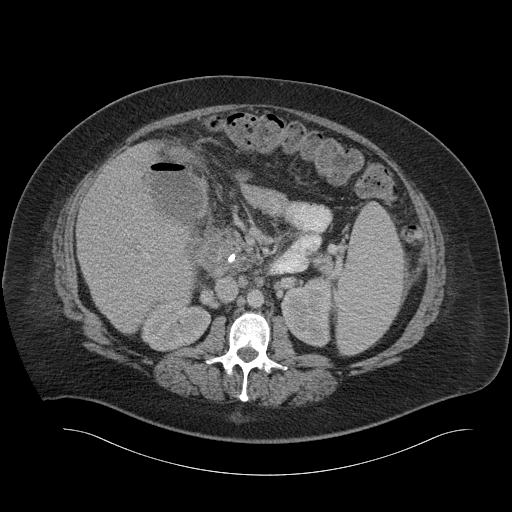
[im 61/96  bone]
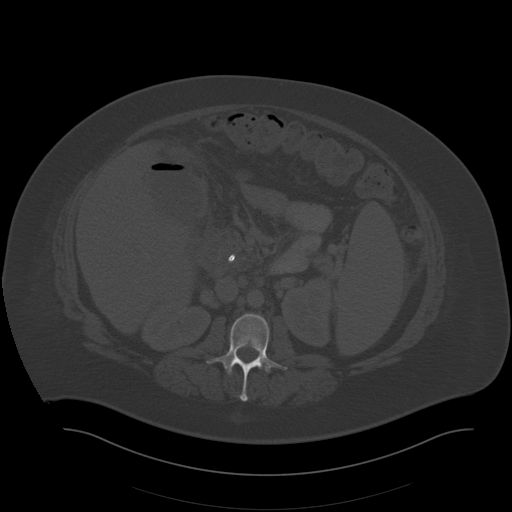
[im 71/96  soft-tissue]
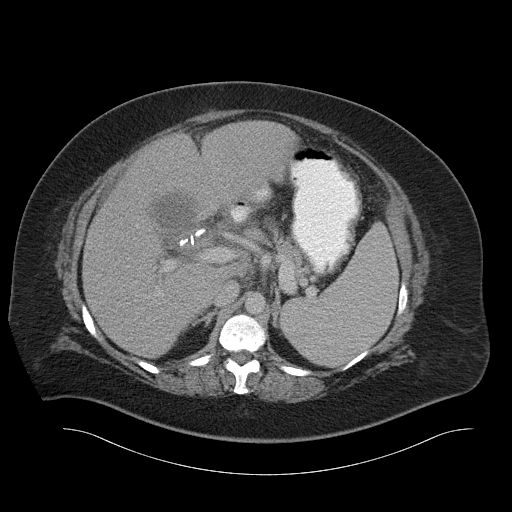
[im 76/96  soft-tissue]
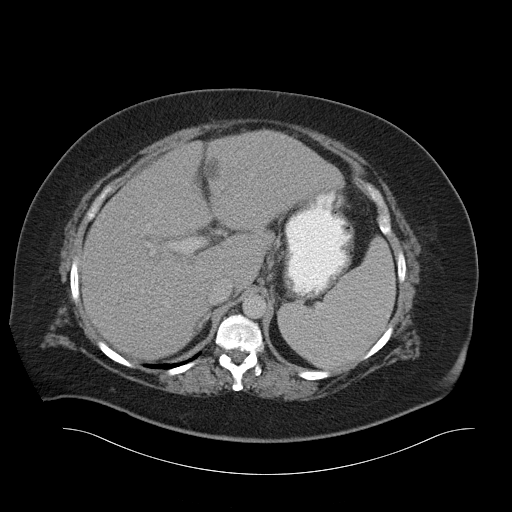
[im 76/96  lung]
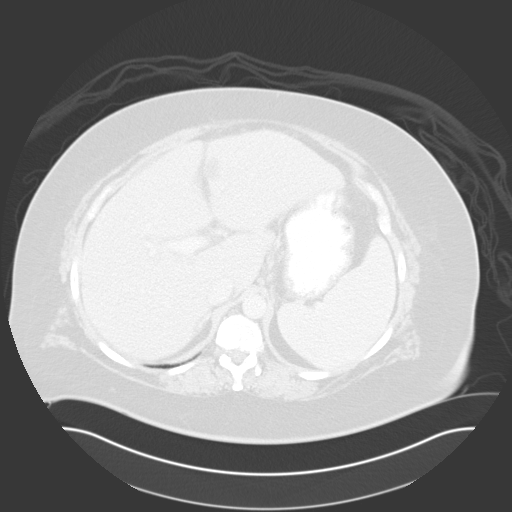
[im 81/96  soft-tissue]
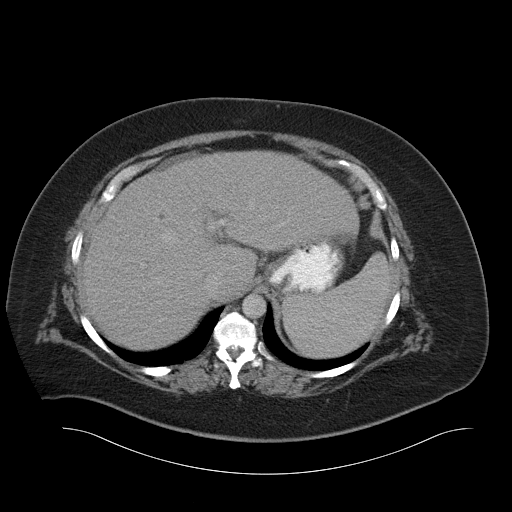
[im 81/96  lung]
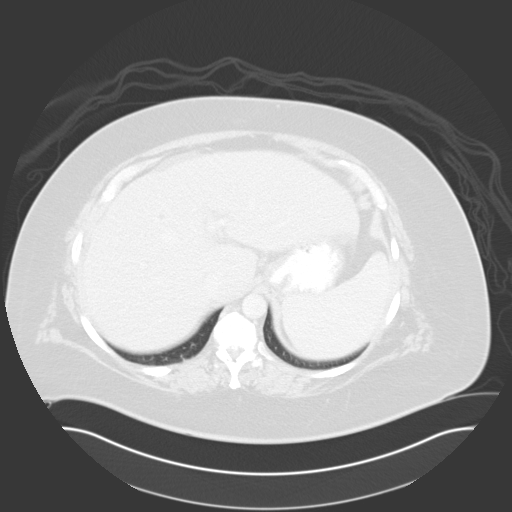
[im 86/96  lung]
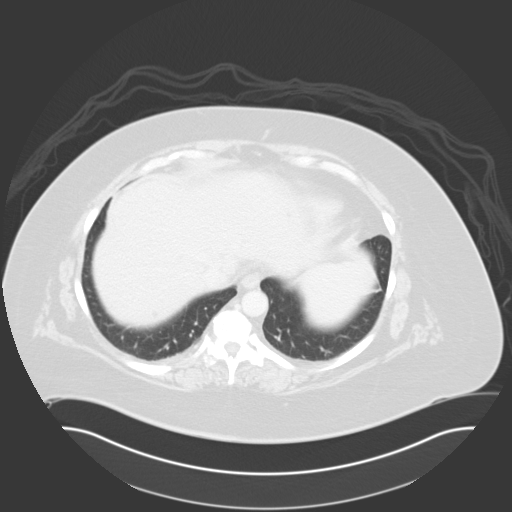
[im 91/96  soft-tissue]
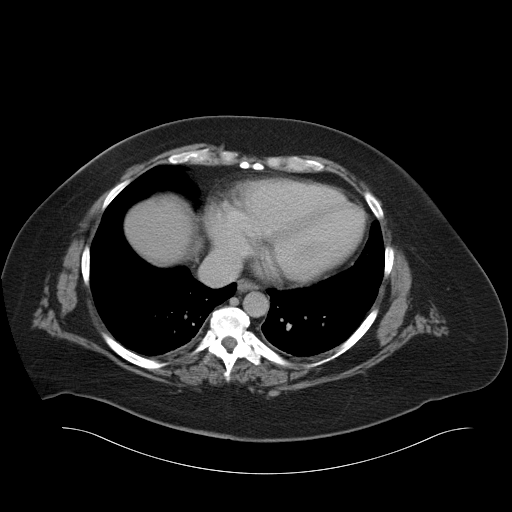
[im 91/96  lung]
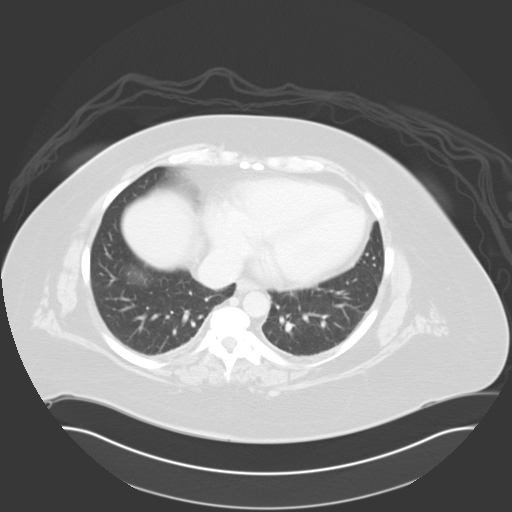

[14 of 32 positions shown; findings below may reference images not displayed]

FINDINGS: Lung bases are clear.  No effusions.  Heart is normal size.

Previously placed drainage catheter is no longer present. The gas
and fluid collection within the gallbladder fossa has enlarged, now
measuring 9.1 x 7.0 cm. This is compatible with enlarging
gallbladder fossa abscess.

Small cysts scattered throughout the liver. Spleen for is enlarged
with a craniocaudal length of 21 cm. Stable hepatomegaly. Stent
within the distal common bile duct and extending into the duodenum
is unchanged. Pancreas, adrenals, kidneys are unremarkable.

Small amount of free fluid in the pelvis. Stomach, large and small
bowel are unremarkable. Urinary bladder unremarkable.
IMPRESSION: Enlarging gas and fluid collection within the gallbladder fossa now
measuring up to 9.1 cm concerning for postoperative abscess.

Stable hepatosplenomegaly.

## 2015-12-18 IMAGING — CT CT IMAGE GUIDED DRAINAGE BY PERCUTANEOUS CATHETER
1 of 5 series · 12 of 32 positions shown, 18 images · non-contrast
Comparison: CT abdomen and pelvis - 08/03/2014;

INDICATION: History of bile leak following cholecystectomy, post CT-guided
percutaneous drainage catheter placed on 07/23/2014. The
percutaneous drainage catheter was subsequently removed, however the
patient return to the hospital with recurrent gas and fluid
collection within the gallbladder fossa worrisome for a a residual
leak and/or abscess. Please perform CT-guided percutaneous drainage
catheter placement.

EXAM:
CT IMAGE GUIDED DRAINAGE BY PERCUTANEOUS CATHETER

[Series 2: routine abdomen · axial · 0.91mm/px · z∈[+951,+1186]mm · 12 of 57 slices shown, 18 images]
[im 5/57  soft-tissue]
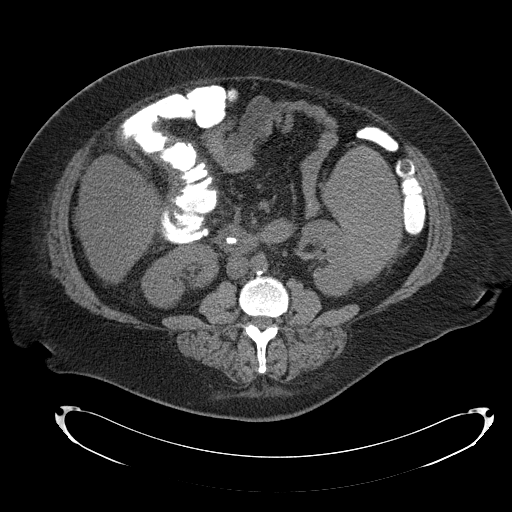
[im 5/57  bone]
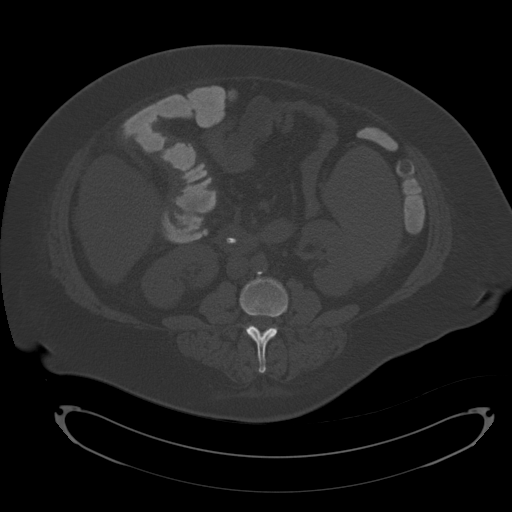
[im 9/57  soft-tissue]
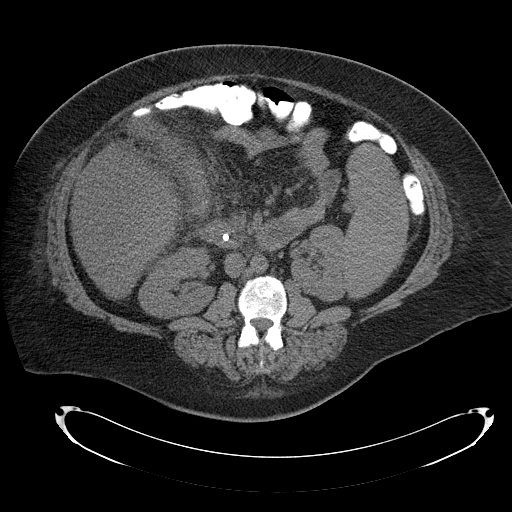
[im 13/57  soft-tissue]
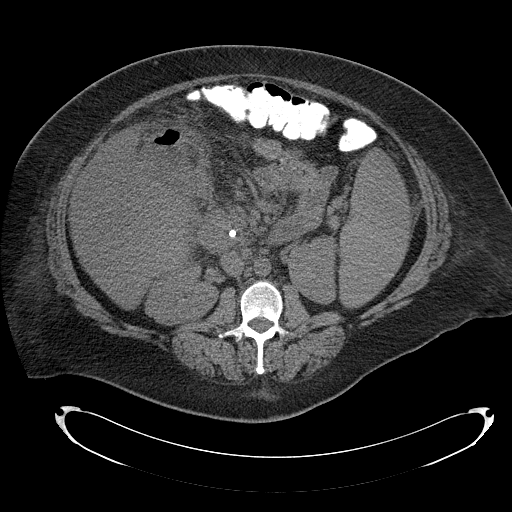
[im 18/57  soft-tissue]
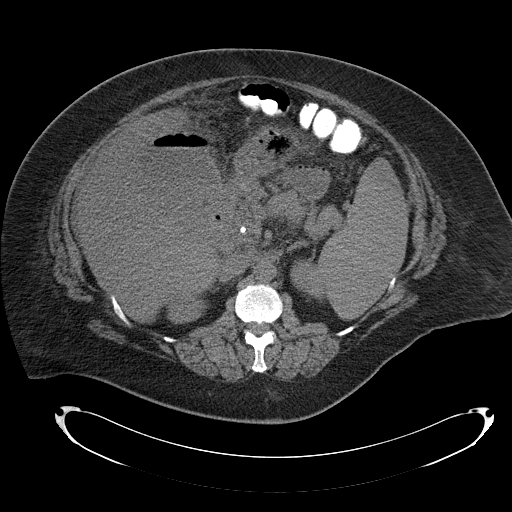
[im 22/57  soft-tissue]
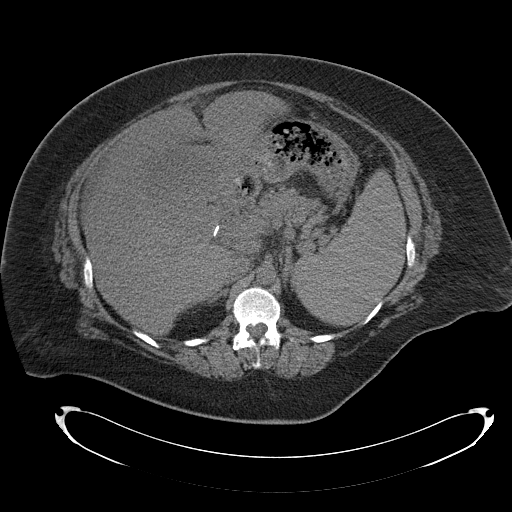
[im 26/57  soft-tissue]
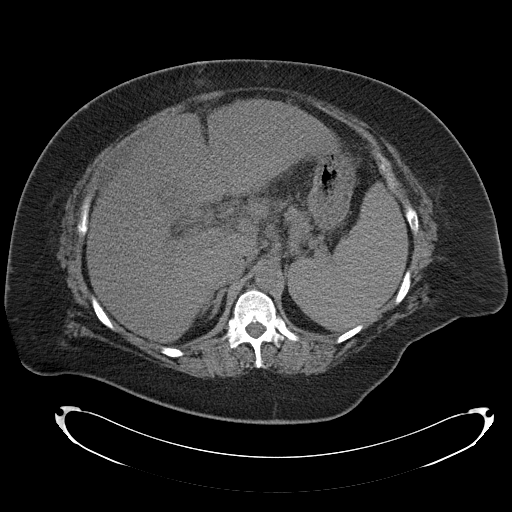
[im 31/57  soft-tissue]
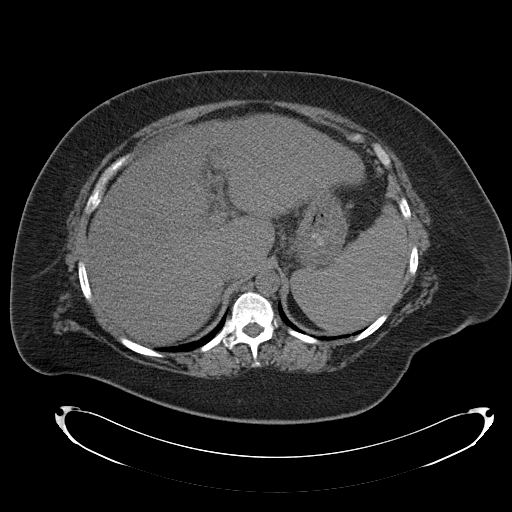
[im 35/57  soft-tissue]
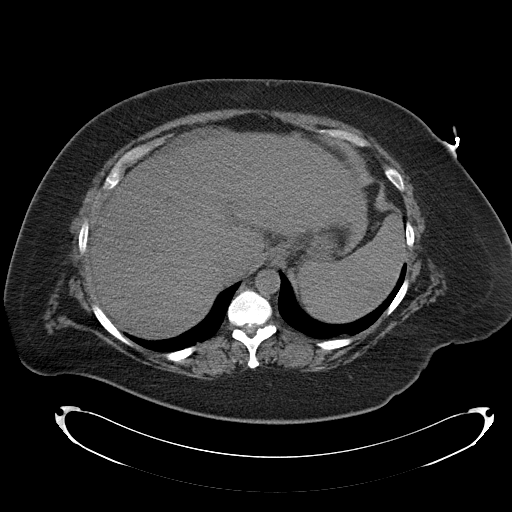
[im 39/57  soft-tissue]
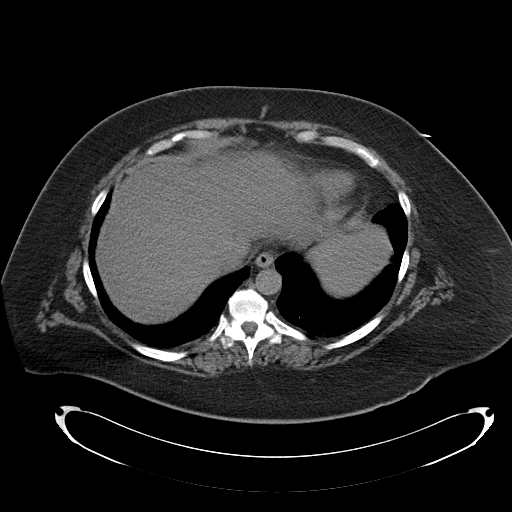
[im 39/57  lung]
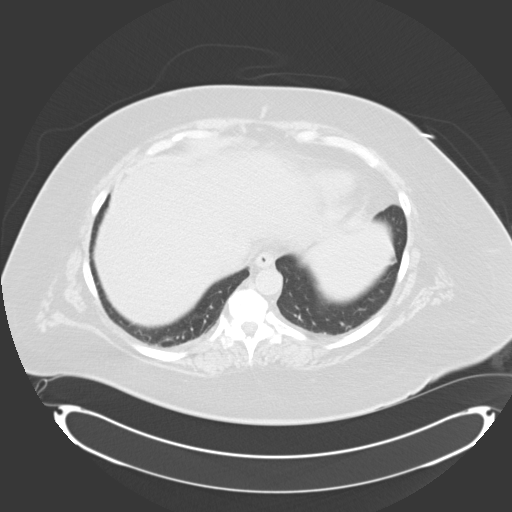
[im 39/57  bone]
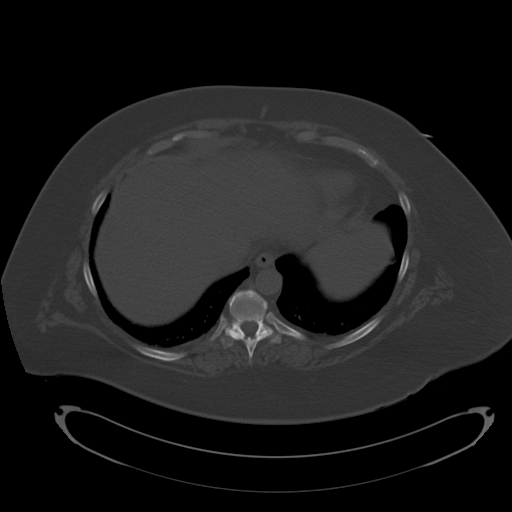
[im 44/57  soft-tissue]
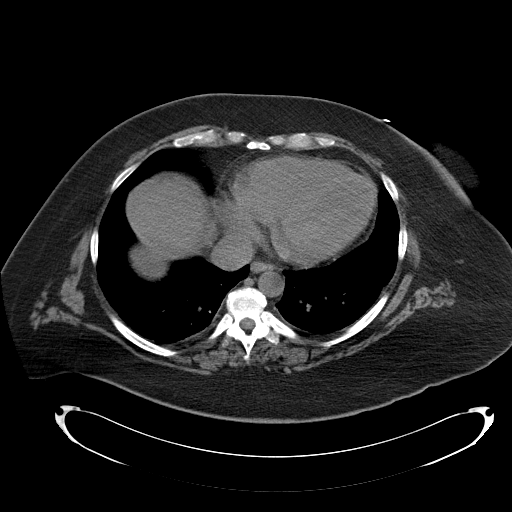
[im 44/57  lung]
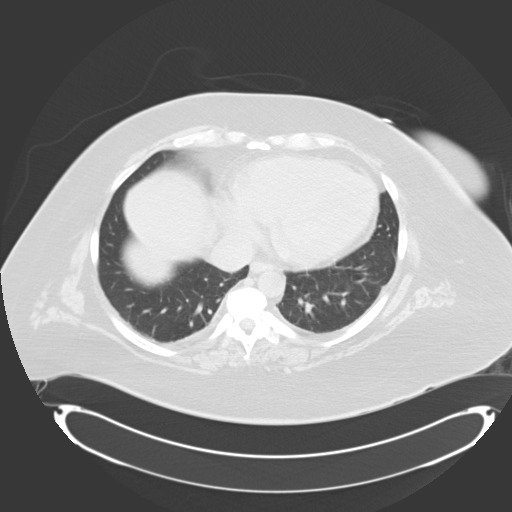
[im 48/57  soft-tissue]
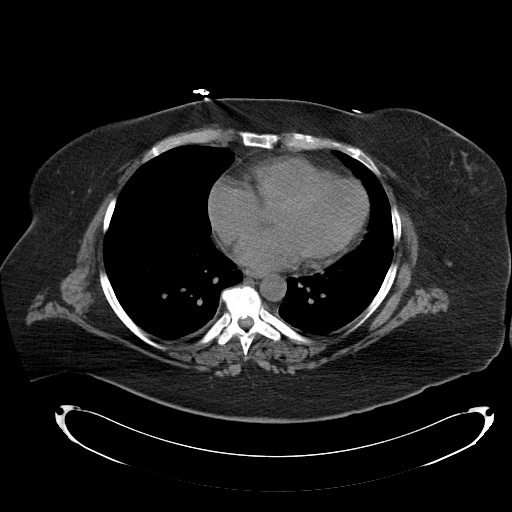
[im 48/57  lung]
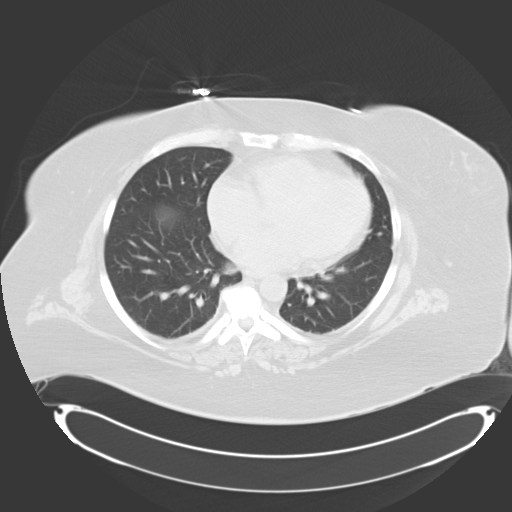
[im 52/57  soft-tissue]
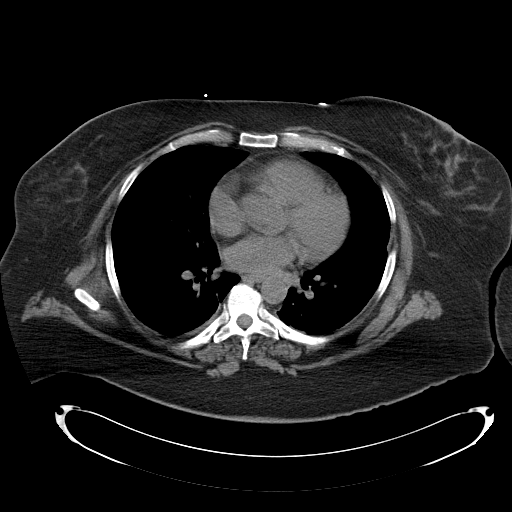
[im 52/57  lung]
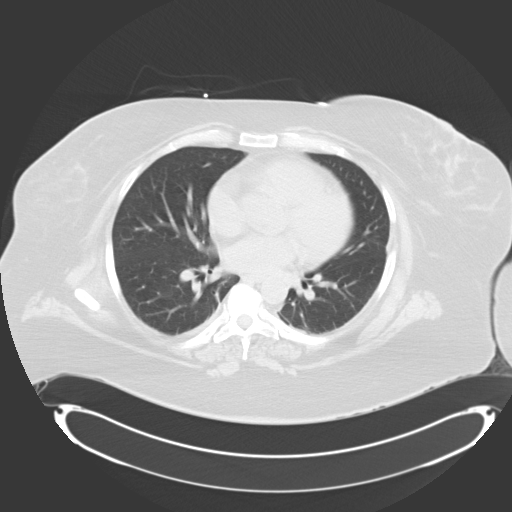

[12 of 32 positions shown; findings below may reference images not displayed]

07/22/2014;
CT-guided percutaneous drainage catheter placement - 07/23/2014

MEDICATIONS:
The patient is currently admitted to the hospital and receiving
intravenous antibiotics. The antibiotics were administered within an
appropriate time frame prior to the initiation of the procedure.

ANESTHESIA/SEDATION:
Conscious sedation was achieved with intravenous Versed and Fentanyl

Total Moderate Sedation time

20 minutes

CONTRAST:  None

COMPLICATIONS:
None immediate

PROCEDURE:
Informed written consent was obtained from the patient after a
discussion of the risks, benefits and alternatives to treatment. The
patient was placed supine on the CT gantry and a pre procedural CT
was performed re-demonstrating the known air-containing
abscess/fluid collection within the gallbladder fossa with dominant
component measuring approximately 8.5 x 7.1 cm (image 39, series
2).. The procedure was planned. A timeout was performed prior to the
initiation of the procedure.

The skin overlying the right upper abdominal quadrant was prepped
and draped in the usual sterile fashion. The overlying soft tissues
were anesthetized with 1% lidocaine with epinephrine. Appropriate
trajectory was planned with the use of a 22 gauge spinal needle.
Utilizing a trans hepatic approach, a 18 gauge trocar needle was
advanced into the abscess/fluid collection and a short Amplatz super
stiff wire was coiled within the collection. Appropriate positioning
was confirmed with a limited CT scan. The tract was serially dilated
allowing placement of a 10 French all-purpose drainage catheter.
Appropriate positioning was confirmed with a limited postprocedural
CT scan.

Approximately 300 Ml of purulent, foul smelling fluid was aspirated.
The tube was connected to a drainage bag and sutured in place. A
dressing was placed. The patient tolerated the procedure well
without immediate post procedural complication.
IMPRESSION: Successful CT guided placement of a 10 French all purpose drain
catheter into the gallbladder fossa via a transhepatic approach with
aspiration of 300 mL of purulent, foul smelling fluid. Samples were
sent to the laboratory as requested by the ordering clinical team.

## 2015-12-20 IMAGING — CR DG CHEST 1V PORT
1 series · 1 of 1 positions shown · non-contrast
Comparison: Chest radiograph performed 07/18/2014

CLINICAL DATA: Central line placement.  Initial encounter.

EXAM:
PORTABLE CHEST - 1 VIEW

[ap]
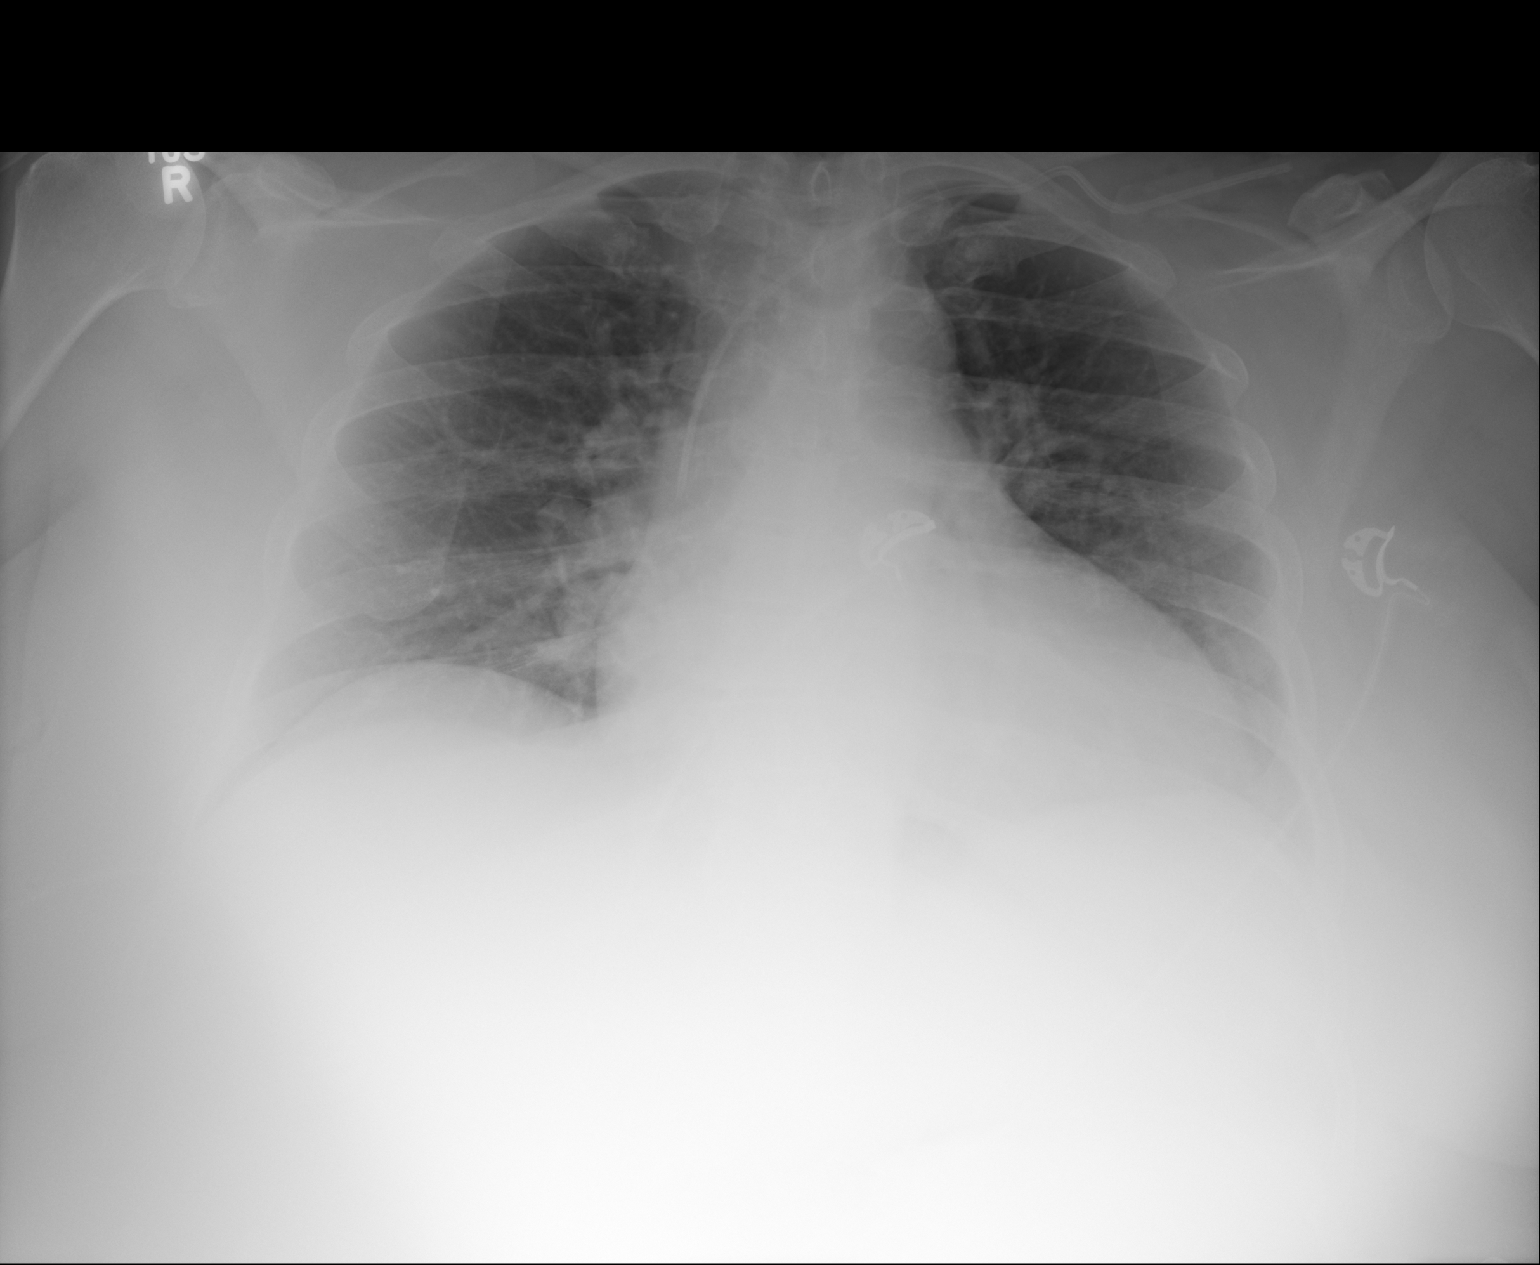

[1 of 1 positions shown; findings below may reference images not displayed]

FINDINGS: The patient's left subclavian line is noted ending about the mid
SVC.

The lungs are hypoexpanded. Vascular congestion is noted. Minimal
bilateral atelectasis is seen. No pleural effusion or pneumothorax
is identified.

The cardiomediastinal silhouette is borderline enlarged. No acute
osseous abnormalities are identified.
IMPRESSION: 1. Left subclavian line noted ending about the mid SVC.
2. Lungs hypoexpanded. Vascular congestion and borderline
cardiomegaly noted. Minimal bilateral atelectasis seen.

## 2015-12-20 IMAGING — CT CT ABD-PELV W/ CM
1 of 3 series · 12 of 32 positions shown, 17 images · IV contrast (omnipaque)
Comparison: CT of the abdomen and pelvis performed 08/03/2014

CLINICAL DATA: Acute onset of worsening right upper quadrant
abdominal pain, nausea and vomiting. Fever. Abscess at the
gallbladder fossa, status post CT-guided drainage. Fever and
hypotension, with severe abdominal pain. Initial encounter.

EXAM:
CT CHEST, ABDOMEN, AND PELVIS WITH CONTRAST
TECHNIQUE: Multidetector CT imaging of the chest, abdomen and pelvis was
performed following the standard protocol during bolus
administration of intravenous contrast.
CONTRAST:  100mL OMNIPAQUE IOHEXOL 350 MG/ML SOLN

[Series 2: cap with · axial · 0.98mm/px · z∈[-630,-90]mm · 12 of 124 slices shown, 17 images]
[im 8/124  soft-tissue]
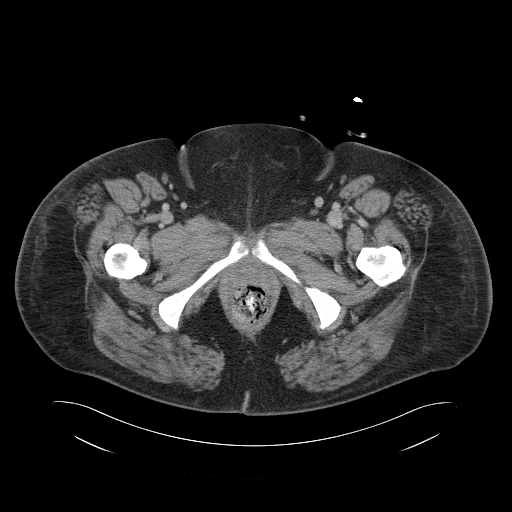
[im 8/124  bone]
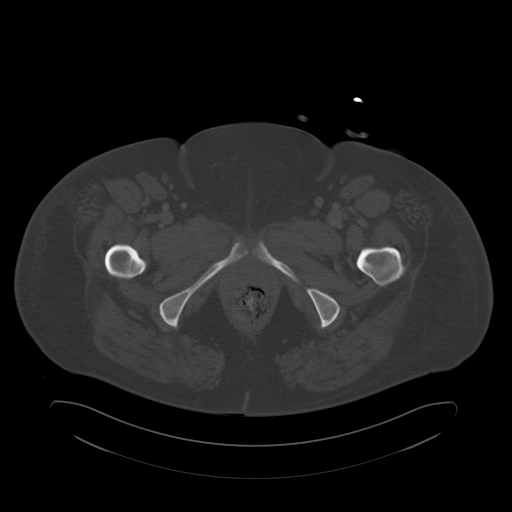
[im 24/124  soft-tissue]
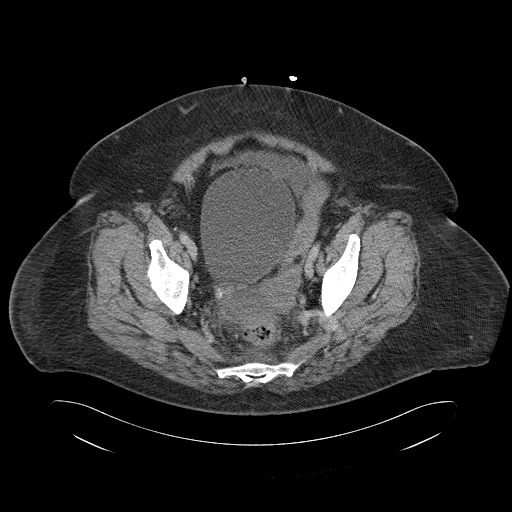
[im 31/124  soft-tissue]
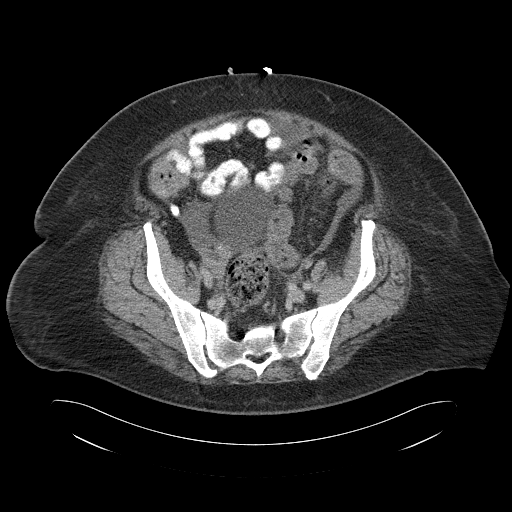
[im 39/124  soft-tissue]
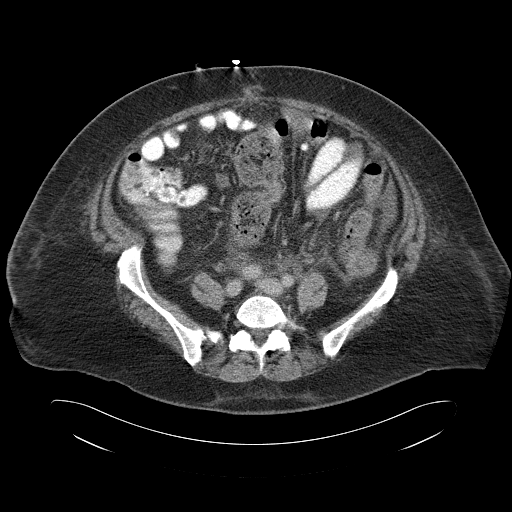
[im 54/124  soft-tissue]
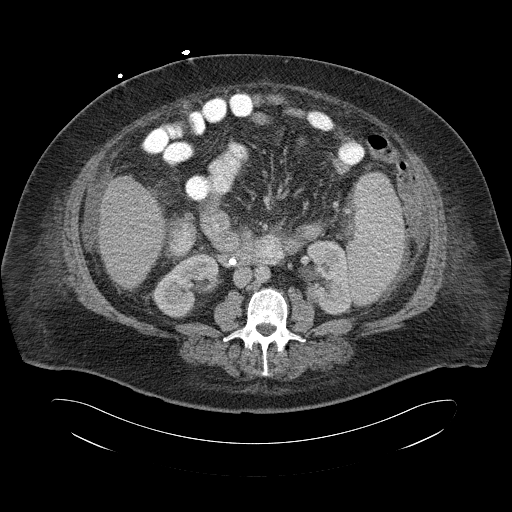
[im 62/124  soft-tissue]
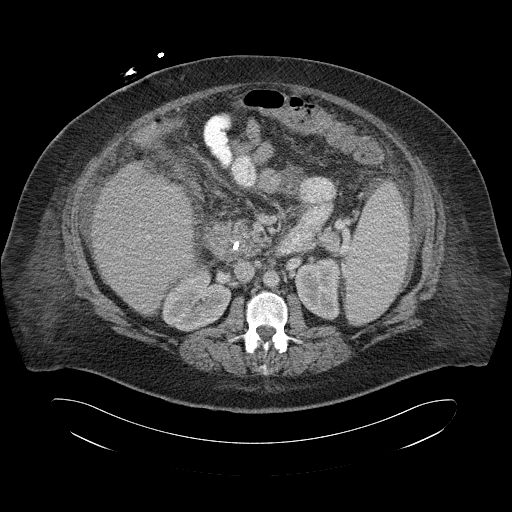
[im 70/124  soft-tissue]
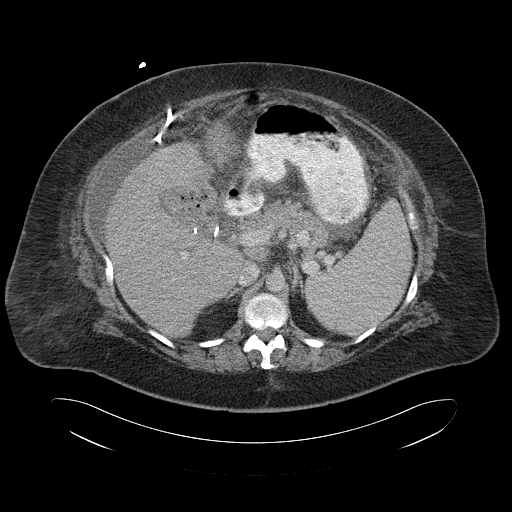
[im 85/124  soft-tissue]
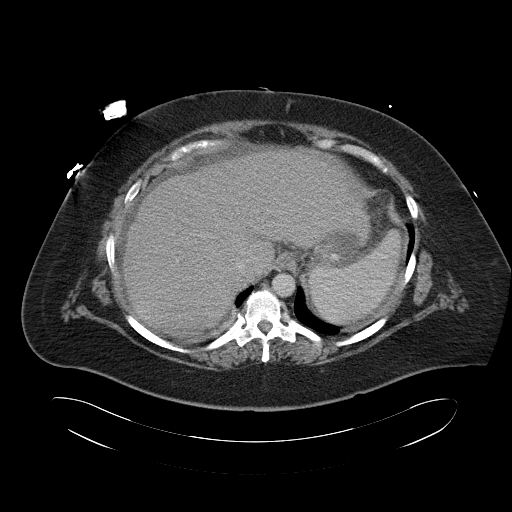
[im 93/124  soft-tissue]
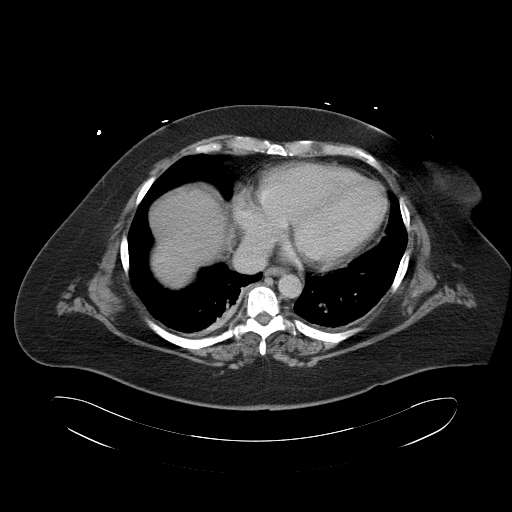
[im 93/124  lung]
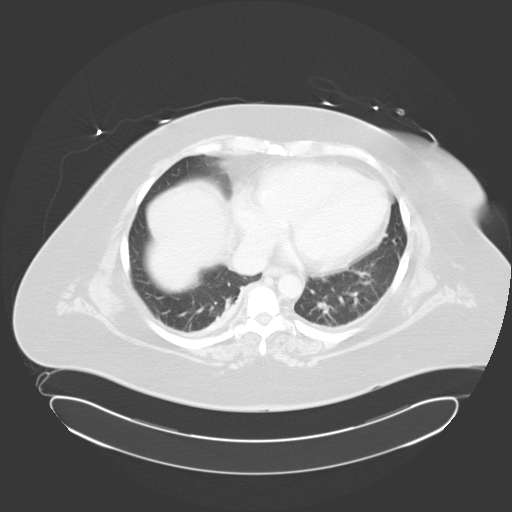
[im 93/124  bone]
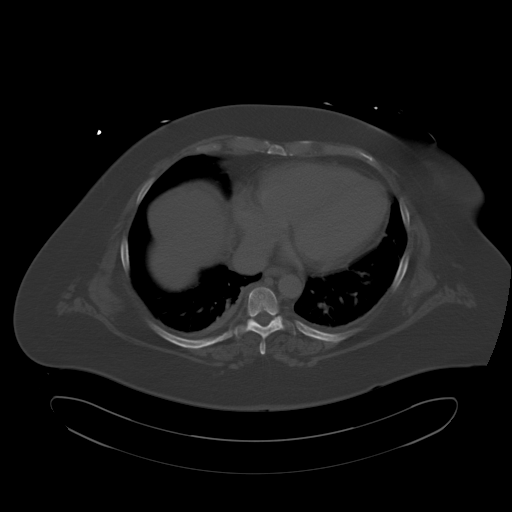
[im 100/124  soft-tissue]
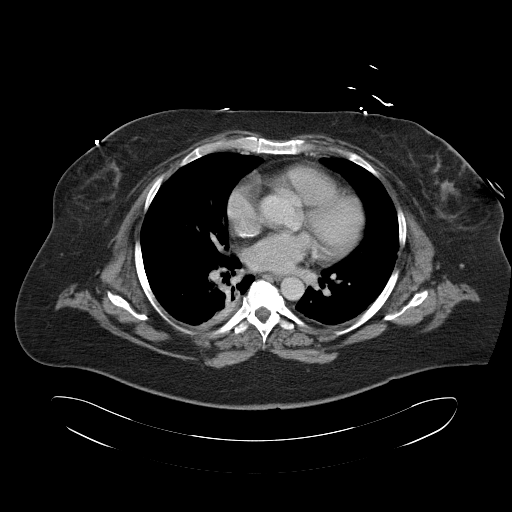
[im 100/124  lung]
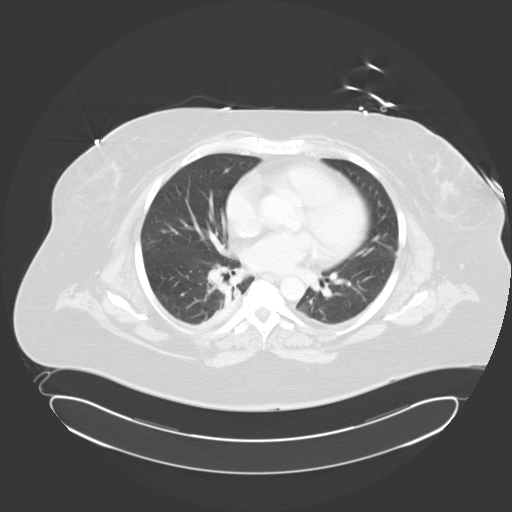
[im 108/124  lung]
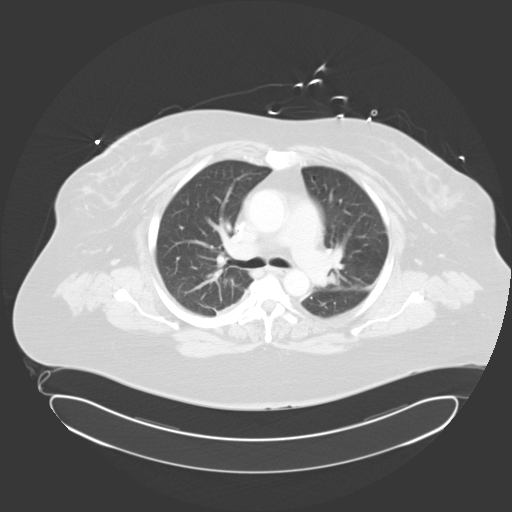
[im 116/124  soft-tissue]
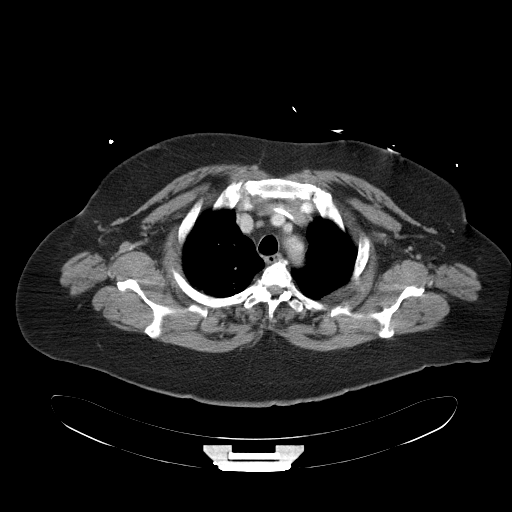
[im 116/124  lung]
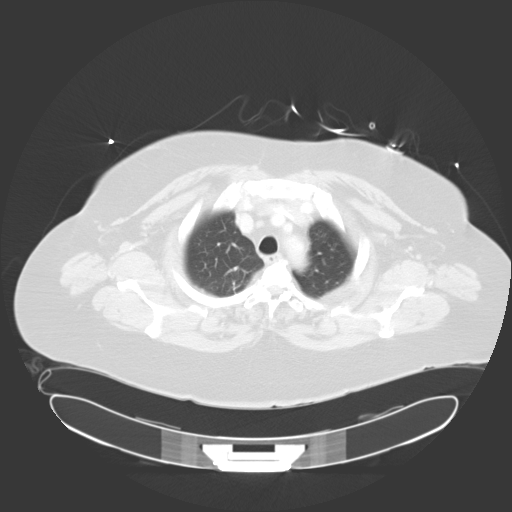

[12 of 32 positions shown; findings below may reference images not displayed]

FINDINGS: CT CHEST FINDINGS

Mild bilateral dependent subsegmental atelectasis is noted. The
lungs are otherwise grossly clear. No pleural effusion or
pneumothorax is seen. No masses are identified.

A left subclavian line is noted ending about the distal SVC. The
mediastinum is unremarkable in appearance. No mediastinal
lymphadenopathy is seen. No pericardial effusion is identified. The
great vessels are grossly unremarkable in appearance. The visualized
portions of the thyroid gland are unremarkable. No axillary
lymphadenopathy is seen.

No acute osseous abnormalities are identified.

CT ABDOMEN AND PELVIS FINDINGS

There is new small to moderate volume ascites noted tracking about
the abdomen and pelvis. Some of this demonstrates increased
attenuation. Given recent placement of a drainage catheter at the
right upper quadrant and return of purulent fluid from a large
abscess, this is concerning for leakage of purulent fluid across the
abdomen, and raises question for peritonitis. This would correspond
to the clinically described severe sepsis and shock. Would correlate
for associated abdominal symptoms.

A 2.1 cm hypodensity within the lateral aspect of the left hepatic
lobe is stable from the prior study and likely reflects a cyst. The
spleen is enlarged, measuring 21.7 cm in length. Residual debris and
soft tissue inflammation are noted at the gallbladder fossa. The
patient is status post cholecystectomy. No definite residual
drainable abscess is seen. The patient's right upper quadrant
pigtail catheter ends adjacent to the gallbladder fossa. The
pancreas and adrenal glands are unremarkable. A common bile duct
stent is noted extending into the third segment of the duodenum.
This is grossly unchanged from the prior study.

The kidneys are unremarkable in appearance. There is no evidence of
hydronephrosis. No renal or ureteral stones are seen. No perinephric
stranding is appreciated.

No free fluid is identified. The small bowel is unremarkable in
appearance. The stomach is within normal limits. No acute vascular
abnormalities are seen.

The appendix is normal in caliber and contains residual contrast.
Minimal diverticulosis is noted along the proximal sigmoid colon.
The colon is otherwise unremarkable.

The bladder is mildly distended and grossly unremarkable. The
patient is status post hysterectomy. The ovaries are grossly
symmetric. No suspicious adnexal masses are seen. No inguinal
lymphadenopathy is seen.

No acute osseous abnormalities are identified. Mild facet disease is
noted at the lower lumbar spine.
IMPRESSION: 1. New small to moderate volume ascites tracking about the abdomen
and pelvis. Some of this demonstrates increased attenuation. Given
recent placement of the drainage catheter at the right upper
quadrant and return of purulent fluid from a large abscess, this is
concerning for leakage of purulent fluid across the abdomen, and
raises question for peritonitis. This would correspond to clinically
described severe sepsis and shock. Would correlate for associated
abdominal symptoms.
2. Residual debris and soft tissue inflammation at the gallbladder
fossa. No definite residual drainable abscess seen at this time.
Right upper quadrant pigtail catheter ends adjacent to the
gallbladder fossa. Common bile duct stent is grossly unchanged from
the prior study.
3. Marked splenomegaly.  Hepatic cyst again noted.
4. Minimal bilateral dependent subsegmental atelectasis. Lungs
otherwise clear.
5. Minimal diverticulosis along the proximal sigmoid colon.

These results were called by telephone at the time of interpretation
on 08/07/2014 at [DATE] to Nursing in the [HOSPITAL] ICU, who
verbally acknowledged these results.

## 2015-12-21 ENCOUNTER — Telehealth: Payer: Self-pay | Admitting: Pain Medicine

## 2015-12-21 ENCOUNTER — Encounter: Payer: Medicaid Other | Admitting: Pain Medicine

## 2015-12-21 NOTE — Telephone Encounter (Signed)
Can you see if this is the correct number. I cant get in to change number. Thanks

## 2015-12-21 NOTE — Telephone Encounter (Signed)
Spoke with patient to confirm PCP provider and information given to Dr Dossie Arbour.

## 2015-12-21 NOTE — Telephone Encounter (Signed)
Patient called stating she thought she gave the wrong number for her PCP  It is [phone]

## 2015-12-21 NOTE — Telephone Encounter (Signed)
Patient called stating she is in [place] and is sick, unable to get here for appt on 12-19, wants to know is it ok if her pcp writes her pain meds for January and February as her next appt is March 8? Please let patient know  [phone]

## 2015-12-22 ENCOUNTER — Other Ambulatory Visit: Payer: Self-pay | Admitting: Pain Medicine

## 2015-12-22 ENCOUNTER — Ambulatory Visit: Payer: Medicaid Other | Admitting: Pain Medicine

## 2015-12-22 DIAGNOSIS — F1193 Opioid use, unspecified with withdrawal: Secondary | ICD-10-CM

## 2015-12-22 DIAGNOSIS — F1123 Opioid dependence with withdrawal: Secondary | ICD-10-CM | POA: Insufficient documentation

## 2015-12-22 MED ORDER — TIZANIDINE HCL 4 MG PO CAPS: 4.0000 mg | ORAL_CAPSULE | Freq: Three times a day (TID) | ORAL | 0 refills | Status: AC | PRN

## 2015-12-22 NOTE — Telephone Encounter (Signed)
Patient called and notified that Dr Dossie Arbour would be calling in Clonidine to take after she weans off her pain mediciane.  Instructed patient to taper down her pain medicine and start the clonidine if signs of withdarwals noted.  Patient states understanding.  Did not question instructions or reasons.

## 2015-12-22 NOTE — Telephone Encounter (Signed)
Attempted to call patient .  Left message to call office as soon as possible.

## 2016-01-07 ENCOUNTER — Ambulatory Visit: Payer: Medicaid Other | Attending: Pain Medicine | Admitting: Pain Medicine

## 2016-01-07 ENCOUNTER — Encounter: Payer: Self-pay | Admitting: Pain Medicine

## 2016-01-07 VITALS — BP 156/84 | HR 120 | Temp 97.8°F | Resp 20 | Ht 62.0 in | Wt 250.0 lb

## 2016-01-07 DIAGNOSIS — Z7982 Long term (current) use of aspirin: Secondary | ICD-10-CM | POA: Insufficient documentation

## 2016-01-07 DIAGNOSIS — G894 Chronic pain syndrome: Secondary | ICD-10-CM | POA: Insufficient documentation

## 2016-01-07 DIAGNOSIS — Z9049 Acquired absence of other specified parts of digestive tract: Secondary | ICD-10-CM | POA: Diagnosis not present

## 2016-01-07 DIAGNOSIS — M5441 Lumbago with sciatica, right side: Secondary | ICD-10-CM

## 2016-01-07 DIAGNOSIS — Z79891 Long term (current) use of opiate analgesic: Secondary | ICD-10-CM | POA: Diagnosis not present

## 2016-01-07 DIAGNOSIS — G8929 Other chronic pain: Secondary | ICD-10-CM

## 2016-01-07 DIAGNOSIS — Z5181 Encounter for therapeutic drug level monitoring: Secondary | ICD-10-CM | POA: Diagnosis present

## 2016-01-07 DIAGNOSIS — M5416 Radiculopathy, lumbar region: Secondary | ICD-10-CM | POA: Diagnosis not present

## 2016-01-07 DIAGNOSIS — F1190 Opioid use, unspecified, uncomplicated: Secondary | ICD-10-CM | POA: Diagnosis not present

## 2016-01-07 DIAGNOSIS — M48061 Spinal stenosis, lumbar region without neurogenic claudication: Secondary | ICD-10-CM

## 2016-01-07 DIAGNOSIS — Z90710 Acquired absence of both cervix and uterus: Secondary | ICD-10-CM | POA: Diagnosis not present

## 2016-01-07 DIAGNOSIS — M5442 Lumbago with sciatica, left side: Secondary | ICD-10-CM

## 2016-01-07 DIAGNOSIS — Z9889 Other specified postprocedural states: Secondary | ICD-10-CM | POA: Diagnosis not present

## 2016-01-07 DIAGNOSIS — Z79899 Other long term (current) drug therapy: Secondary | ICD-10-CM | POA: Insufficient documentation

## 2016-01-07 MED ORDER — OXYCODONE HCL 10 MG PO TABS: 10.0000 mg | ORAL_TABLET | Freq: Every day | ORAL | 0 refills | Status: DC | PRN

## 2016-01-07 NOTE — Patient Instructions (Signed)
Hope you feel better soon!!   Remember to bring your medications next appointment for [place] to count. Also bring empty bottle.Pain Management Discharge Instructions  General Discharge Instructions :  If you need to reach your doctor call: Monday-Friday 8:00 am - 4:00 pm at [phone] or toll free [phone].  After clinic hours [phone] to have operator reach doctor.  Bring all of your medication bottles to all your appointments in the pain clinic.  To cancel or reschedule your appointment with Pain Management please remember to call 24 hours in advance to avoid a fee.  Refer to the educational materials which you have been given on: General Risks, I had my Procedure. Discharge Instructions, Post Sedation.  Post Procedure Instructions:  The drugs you were given will stay in your system until tomorrow, so for the next 24 hours you should not drive, make any legal decisions or drink any alcoholic beverages.  You may eat anything you prefer, but it is better to start with liquids then soups and crackers, and gradually work up to solid foods.  Please notify your doctor immediately if you have any unusual bleeding, trouble breathing or pain that is not related to your normal pain.  Depending on the type of procedure that was done, some parts of your body may feel week and/or numb.  This usually clears up by tonight or the next day.  Walk with the use of an assistive device or accompanied by an adult for the 24 hours.  You may use ice on the affected area for the first 24 hours.  Put ice in a Ziploc bag and cover with a towel and place against area 15 minutes on 15 minutes off.  You may switch to heat after 24 hours.

## 2016-01-07 NOTE — Progress Notes (Signed)
Nursing Pain Medication Assessment:  Safety precautions to be maintained throughout the outpatient stay will include: orient to surroundings, keep bed in low position, maintain call bell within reach at all times, provide assistance with transfer out of bed and ambulation.  Medication Inspection Compliance: [name] did not comply with our request to bring her pills to be counted. She was reminded that bringing the medication bottles, even when empty, is a requirement. Pill Count: No pills available to be counted today. Bottle Appearance: No container available. Did not bring bottle(s) to appointment. Medication: None brought in. Filled Date: N/A Medication last intake: 01/04/16 am

## 2016-01-07 NOTE — Progress Notes (Signed)
Patient's Name: Lori Mckenzie  MRN: [id number]  Referring Provider: Ardine Eng, MD  DOB: [birth year]  PCP: Ardine Eng, MD  DOS: 01/07/2016  Note by: Kathlen Brunswick. Dossie Arbour, MD  Service setting: Ambulatory outpatient  Specialty: Interventional Pain Management  Location: [facility] (AMB) Pain Management Facility    Patient type: Established   Primary Reason(s) for Visit: Encounter for prescription drug management (Level of risk: moderate) CC: Back Pain (lower)  HPI  Lori Mckenzie is a 51 y.o. year old, female patient, who comes today for a medication management evaluation. She has Type 2 diabetes mellitus ([place]); COPD (chronic obstructive pulmonary disease) (HCC); HTN (hypertension); GERD (gastroesophageal reflux disease); OSA on CPAP; Anxiety; Steatohepatitis; Bile leak, postoperative; Dysthymia; Other social stressor; Abdominal abscess ([place]); Ascites; NASH (nonalcoholic steatohepatitis); Hypokalemia; Pyrexia; Opiate use (75 MME/Day); Long term prescription opiate use; Long term current use of opiate analgesic; Encounter for therapeutic drug level monitoring; Chronic epigastric abdominal pain; Chronic low back pain (Location of Primary Source of Pain) (Bilateral) (R>L); Chronic neck pain (Location of Tertiary source of pain) (Bilateral) (R>L); Breath shortness; Diastolic dysfunction; Clinical depression; Chronic obstructive pulmonary disease ([place]); Airway hyperreactivity; Anxiety state; Acid reflux; Essential (primary) hypertension; 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, L1-2, & L4-5); Lumbar and sacral osteoarthritis; Myofascial pain; Sleep apnea; Fall; Drowsiness; Episode of syncope; Subacute lumbar radiculopathy (left side) (S1 dermatome); Vitamin D deficiency; B12 deficiency; Folate deficiency; Thoracic radiculitis; Major depressive disorder with single episode; Elevated sedimentation rate; Elevated C-reactive protein (CRP); Lumbar facet syndrome (Location of Primary Source of Pain) (Bilateral) (R>L); Cervical  spondylosis; Chronic feet pain (Location of Secondary source of pain) (Bilateral) (R>L); Lumbar spondylosis; Encounter for chronic pain management; Chronic shoulder pain (Bilateral) (R>L); Chronic carpal tunnel syndrome (Bilateral); Chronic hip pain (Bilateral) (L>R); Chronic upper back pain (Bilateral) (L>R); Osteoporosis, idiopathic; Abnormal MRI, lumbar spine (02/03/2015); Hepatic encephalopathy ([place]); Radicular pain of thoracic region; Altered mental status; Cirrhosis of liver without ascites ([place]); Elevated liver enzymes; Opiate withdrawal ([place]); and Chronic pain syndrome on her problem list. Her primarily concern today is the Back Pain (lower)  Pain Assessment: Self-Reported Pain Score: 4 /10             Reported level is compatible with observation.       Pain Type: Chronic pain Pain Location: Back Pain Orientation: Mid, Lower, Right, Left Pain Descriptors / Indicators: Aching, Stabbing, Tingling, Radiating Pain Frequency: Constant  Lori Mckenzie was last seen on 12/21/2015 for medication management. During today's appointment we reviewed [name]'s chronic pain status, as well as her outpatient medication regimen.  The patient  reports that she does not use drugs. Her body mass index is 45.73 kg/m.  Further details on both, my assessment(s), as well as the proposed treatment plan, please see below.  Controlled Substance Pharmacotherapy Assessment REMS (Risk Evaluation and Mitigation Strategy)  Analgesic:Oxycodone/APAP 10/325 5 tablets per day (50 mg/day) MME/day:75 mg/day.  Ignatius Specking, RN  01/07/2016 10:30 AM  Sign at close encounter Nursing Pain Medication Assessment:  Safety precautions to be maintained throughout the outpatient stay will include: orient to surroundings, keep bed in low position, maintain call bell within reach at all times, provide assistance with transfer out of bed and ambulation.  Medication Inspection Compliance: Lori Mckenzie did not comply with our request  to bring her pills to be counted. She was reminded that bringing the medication bottles, even when empty, is a requirement. Pill Count: No pills available to be counted today. Bottle Appearance: No container available. Did not  bring bottle(s) to appointment. Medication: None brought in. Filled Date: N/A Medication last intake: 01/04/16 am   Pharmacokinetics: Liberation and absorption (onset of action): WNL Distribution (time to peak effect): WNL Metabolism and excretion (duration of action): WNL         Pharmacodynamics: Desired effects: Analgesia: Lori Mckenzie reports >50% benefit. Functional ability: Patient reports that medication allows her to accomplish basic ADLs Clinically meaningful improvement in function (CMIF): Sustained CMIF goals met Perceived effectiveness: Described as relatively effective, allowing for increase in activities of daily living (ADL) Undesirable effects: Side-effects or Adverse reactions: None reported Monitoring: [place] PMP: Online review of the past 12-monthperiod conducted. Compliant with practice rules and regulations List of all UDS test(s) done:  Lab Results  Component Value Date   TOXASSSELUR FINAL 09/28/2015   [place] 03/05/2015   TOX[place]SELUR FINAL 02/05/2015   [place] 01/08/2015   Last UDS on record: ToxAssure Select 13  Date Value Ref Range Status  09/28/2015 FINAL  Final    Comment:    ==================================================================== TOXASSURE SELECT 13 (MW) ==================================================================== Test                             Result       Flag       Units Drug Present and Declared for Prescription Verification   Alprazolam                     262          EXPECTED   ng/mg creat   Alpha-hydroxyalprazolam        469          EXPECTED   ng/mg creat    Source of alprazolam is a scheduled prescription medication.    Alpha-hydroxyalprazolam is an expected metabolite of  alprazolam.   Noroxycodone                   318          EXPECTED   ng/mg creat    Noroxycodone is an expected metabolite of oxycodone. Sources of    oxycodone include scheduled prescription medications. Drug Present not Declared for Prescription Verification   Morphine                       52           UNEXPECTED ng/mg creat    Potential sources of morphine include administration of codeine    or morphine, use of heroin, or ingestion of poppy seeds. Drug Absent but Declared for Prescription Verification   Oxycodone                      Not Detected UNEXPECTED ng/mg creat    Oxycodone is almost always present in patients taking this drug    consistently.  Absence of oxycodone could be due to lapse of time    since the last dose or unusual pharmacokinetics (rapid    metabolism). ==================================================================== Test                      Result    Flag   Units      Ref Range   Creatinine              131              mg/dL      >=20 ====================================================================  Declared Medications:  The flagging and interpretation on this report are based on the  following declared medications.  Unexpected results may arise from  inaccuracies in the declared medications.  **Note: The testing scope of this panel includes these medications:  Alprazolam (Xanax)  Oxycodone  **Note: The testing scope of this panel does not include following  reported medications:  Albuterol  Aspirin (Aspirin 81)  Cholecalciferol  Citalopram (Celexa)  Cyanocobalamin  Dicyclomine (Bentyl)  Fluticasone (Flonase)  Furosemide (Lasix)  Gabapentin (Neurontin)  Glipizide (Glucotrol)  Hydrochlorothiazide (Hydrodiuril)  Ipratropium (Atrovent)  Iron  Lactulose  Metformin  Naloxone (Narcan)  Nystatin (Mycostatin)  Nystatin (Nystop)  Ondansetron (Zofran)  Pantoprazole (Protonix)  Promethazine (Phenergan)  Rifaximin (Xifaxan)  Rizatriptan  (Maxalt)  Sennosides  Spironolactone (Aldactone)  Triamcinolone (Kenalog)  Vitamin B12 ==================================================================== For clinical consultation, please call [phone]. ====================================================================    UDS interpretation: Non-Compliant Today the patient was asked about providing [place] with an explanation on why these results were found. She had no answer. Medication Assessment Form: Reviewed. Patient indicates being compliant with therapy Treatment compliance: Deficiencies noted and steps taken to remind the patient of the seriousness of adequate therapy compliance. Risk Assessment Profile: Aberrant behavior: Obtaining medications from illicit sources and medication diversion. We received an anonymous phone call were the [place] indicated that this patient was selling some of her medications. Today the patient was dilated rate confronted about this and she denied it. Because I do not have a weight to confirm that this is actually true, I will simply increase my monitoring of the patient and see if we can detect any additional problems. We already have that her last UDS was abnormal with no oxycodone and positive for on reported morphine. In addition, the patient did not bring her pills or bottles to be examined today. Comorbid factors increasing risk of overdose: See prior notes. No additional risks detected today Risk of substance use disorder (SUD): Very High Opioid Risk Tool (ORT) Total Score: 5  Interpretation Table:  Score <3 = Low Risk for SUD  Score between 4-7 = Moderate Risk for SUD  Score >8 = High Risk for Opioid Abuse   Risk Mitigation Strategies:  Patient Counseling: Covered Patient-Prescriber Agreement (PPA): Present and active  Notification to other healthcare providers: Done  Pharmacologic Plan: Today we will do only one refill since she did not bring her pills and we will repeat her UDS. The  patient was informed that should this UDS be abnormal, we will permanently stop the opioids.  Laboratory Chemistry  Inflammation Markers Lab Results  Component Value Date   ESRSEDRATE 35 (H) 01/08/2015   CRP 2.8 (H) 01/08/2015   Renal Function Lab Results  Component Value Date   BUN 15 08/19/2015   CREATININE 1.09 (H) 08/19/2015   GFRAA >60 08/19/2015   GFRNONAA 57 (L) 08/19/2015   Hepatic Function Lab Results  Component Value Date   AST 65 (H) 08/19/2015   ALT 82 (H) 08/19/2015   ALBUMIN 4.8 08/19/2015   Electrolytes Lab Results  Component Value Date   NA 135 08/19/2015   K 4.3 08/19/2015   CL 99 (L) 08/19/2015   CALCIUM 9.4 08/19/2015   MG 1.9 01/08/2015   Pain Modulating Vitamins Lab Results  Component Value Date   VD25OH 14.3 (L) 01/08/2015   VD125OH2TOT 28.8 01/08/2015   VITAMINB12 172 (L) 01/08/2015   Coagulation Parameters Lab Results  Component Value Date   INR 1.18 12/01/2015   LABPROT 15.1 12/01/2015   APTT  24.6 08/26/2013   PLT 98 (L) 12/01/2015   Cardiovascular Lab Results  Component Value Date   BNP 11 12/11/2012   HGB 15.4 12/01/2015   HCT 43.5 12/01/2015   Note: Lab results reviewed.  Recent Diagnostic Imaging Review  No results found. Note: Imaging results reviewed.          Meds  The patient has a current medication list which includes the following prescription(s): albuterol, albuterol, alprazolam, aspirin ec, cholecalciferol, citalopram, dicyclomine, fluticasone, intrinsi b12-folate, furosemide, gabapentin, hydrochlorothiazide, insulin detemir, ipratropium, lactulose, naloxone, nystatin, ondansetron, oxycodone hcl, pantoprazole, promethazine, rifaximin, rizatriptan, spironolactone, and vitamin b-12.  Current Outpatient Prescriptions on File Prior to Visit  Medication Sig  . albuterol (ACCUNEB) 0.63 MG/3ML nebulizer solution Inhale 1 ampule by nebulization every six (6) hours as needed for wheezing.  Marland Kitchen albuterol (PROVENTIL  HFA;VENTOLIN HFA) 108 (90 BASE) MCG/ACT inhaler Inhale 2 puffs into the lungs every 4 (four) hours as needed for wheezing or shortness of breath.  . ALPRAZolam (XANAX) 1 MG tablet Take 1-2 mg by mouth 3 (three) times daily as needed for anxiety.  Marland Kitchen aspirin EC 81 MG tablet Take 81 mg by mouth daily.   . Cholecalciferol (VITAMIN D-1000 MAX ST) 1000 units tablet Take 2,000 Units by mouth.   . citalopram (CELEXA) 20 MG tablet Take 20 mg by mouth.  . dicyclomine (BENTYL) 10 MG capsule Take 10 mg by mouth 4 (four) times daily -  before meals and at bedtime.   . fluticasone (FLONASE) 50 MCG/ACT nasal spray Place 1-2 sprays into both nostrils daily as needed for rhinitis.   Derald Macleod Factor (INTRINSI B12-FOLATE) 035-009-38 MCG-MCG-MG TABS Take 1 tablet by mouth daily. Reported on 04/06/2015  . furosemide (LASIX) 40 MG tablet Take 80 mg by mouth daily.   Marland Kitchen gabapentin (NEURONTIN) 300 MG capsule Take 900 mg by mouth 4 (four) times daily as needed (for pain).   . hydrochlorothiazide (HYDRODIURIL) 25 MG tablet Take 25 mg by mouth at bedtime.   Marland Kitchen ipratropium (ATROVENT) 0.02 % nebulizer solution Inhale 500 mcg by nebulization Four (4) times a day.  . lactulose (CHRONULAC) 10 GM/15ML solution Take 30 g by mouth 3 (three) times daily as needed.   . naloxone [facility]) 2 MG/2ML injection Inject into the vein as needed.  . nystatin (MYCOSTATIN) powder Apply 1 g topically 3 (three) times daily as needed (for irritation).   . ondansetron (ZOFRAN) 4 MG tablet Take by mouth as needed.   . pantoprazole (PROTONIX) 40 MG tablet Take by mouth.  . promethazine (PHENERGAN) 12.5 MG tablet Take 12.5 mg by mouth every 6 (six) hours as needed for nausea or vomiting.   . rifaximin (XIFAXAN) 550 MG TABS tablet Take 550 mg by mouth 2 (two) times daily.   . rizatriptan (MAXALT) 10 MG tablet Take 10 mg by mouth as needed for migraine.   Marland Kitchen spironolactone (ALDACTONE) 100 MG tablet Take 100 mg by mouth daily.   . vitamin B-12  (CYANOCOBALAMIN) 1000 MCG tablet Take 2,000 mcg by mouth daily.  . [DISCONTINUED] SUMAtriptan (IMITREX) 50 MG tablet Take 50 mg by mouth every 2 (two) hours as needed. For migraines   No current facility-administered medications on file prior to visit.    ROS  Constitutional: Denies any fever or chills Gastrointestinal: No reported hemesis, hematochezia, vomiting, or acute GI distress Musculoskeletal: Denies any acute onset joint swelling, redness, loss of ROM, or weakness Neurological: No reported episodes of acute onset apraxia, aphasia, dysarthria, agnosia, amnesia, paralysis, loss  of coordination, or loss of consciousness  Allergies  Lori Mckenzie is allergic to tape and vicodin [hydrocodone-acetaminophen].  PFSH  Drug: Lori Mckenzie  reports that she does not use drugs. Alcohol:  reports that she does not drink alcohol. Tobacco:  reports that she has never smoked. She has never used smokeless tobacco. Medical:  has a past medical history of Anxiety; Ascites; Asthma; Back pain; Brittle bone disease; Cancer ([place]); Cervical disc disease; Chronic kidney disease; Collagen vascular disease ([place]); COPD (chronic obstructive pulmonary disease) ([place]); Diabetes mellitus without complication ([place]); GERD (gastroesophageal reflux disease); Hypertension; Hypothyroidism; Migraines; NASH (nonalcoholic steatohepatitis); Respiratory infection; Sleep apnea; Sleep apnea; Thyroid disease; and TIA (transient ischemic attack). Family: family history includes Heart disease in her brother and sister; Lung cancer in her father; Ulcers in her father and sister.  Past Surgical History:  Procedure Laterality Date  . ABDOMINAL HYSTERECTOMY    . CHOLECYSTECTOMY N/A 07/15/2014   Procedure: LAPAROSCOPIC CHOLECYSTECTOMY with liver biopsy ;  Surgeon: Sherri Rad, MD;  Location: [facility] ORS;  Service: General;  Laterality: N/A;  . COLONOSCOPY WITH PROPOFOL N/A 06/23/2014   Procedure: COLONOSCOPY WITH PROPOFOL;  Surgeon: Lollie Sails, MD;  Location: [facility] ENDOSCOPY;  Service: Endoscopy;  Laterality: N/A;  . ERCP N/A 07/16/2014   Procedure: ENDOSCOPIC RETROGRADE CHOLANGIOPANCREATOGRAPHY (ERCP);  Surgeon: Clarene Essex, MD;  Location: [facility] ENDOSCOPY;  Service: Endoscopy;  Laterality: N/A;  . ERCP N/A 10/03/2014   Procedure: ENDOSCOPIC RETROGRADE CHOLANGIOPANCREATOGRAPHY (ERCP);  Surgeon: Hulen Luster, MD;  Location: [facility] ENDOSCOPY;  Service: Gastroenterology;  Laterality: N/A;  . ESOPHAGOGASTRODUODENOSCOPY N/A 06/23/2014   Procedure: ESOPHAGOGASTRODUODENOSCOPY (EGD);  Surgeon: Lollie Sails, MD;  Location: [facility] ENDOSCOPY;  Service: Endoscopy;  Laterality: N/A;  . ESOPHAGOGASTRODUODENOSCOPY (EGD) WITH PROPOFOL N/A 12/01/2015   Procedure: ESOPHAGOGASTRODUODENOSCOPY (EGD) WITH PROPOFOL;  Surgeon: Lollie Sails, MD;  Location: [facility] ENDOSCOPY;  Service: Endoscopy;  Laterality: N/A;  . TUBAL LIGATION    . WISDOM TOOTH EXTRACTION     Constitutional Exam  General appearance: Well nourished, well developed, and well hydrated. In no apparent acute distress Vitals:   01/07/16 1026  BP: (!) 156/84  Pulse: (!) 120  Resp: 20  Temp: 97.8 F (36.6 C)  TempSrc: Oral  SpO2: 97%  Weight: 250 lb (113.4 kg)  Height: _0  (1.575 m)   BMI Assessment: Estimated body mass index is 45.73 kg/m as calculated from the following:   Height as of this encounter: _1  (1.575 m).   Weight as of this encounter: 250 lb (113.4 kg).  BMI interpretation table: BMI level Category Range association with higher incidence of chronic pain  <18 kg/m2 Underweight   18.5-24.9 kg/m2 Ideal body weight   25-29.9 kg/m2 Overweight Increased incidence by 20%  30-34.9 kg/m2 Obese (Class I) Increased incidence by 68%  35-39.9 kg/m2 Severe obesity (Class II) Increased incidence by 136%  >40 kg/m2 Extreme obesity (Class III) Increased incidence by 254%   BMI Readings from Last 4 Encounters:  01/07/16 45.73 kg/m  12/01/15 45.73 kg/m  09/28/15 45.73  kg/m  08/19/15 45.73 kg/m   Wt Readings from Last 4 Encounters:  01/07/16 250 lb (113.4 kg)  12/01/15 250 lb (113.4 kg)  09/28/15 250 lb (113.4 kg)  08/19/15 250 lb (113.4 kg)  Psych/Mental status: Alert, oriented x 3 (person, place, & time) Eyes: PERLA Respiratory: No evidence of acute respiratory distress  Cervical Spine Exam  Inspection: No masses, redness, or swelling Alignment: Symmetrical Functional ROM: Unrestricted ROM Stability: No instability detected Muscle  strength & Tone: Functionally intact Sensory: Unimpaired Palpation: Non-contributory  Upper Extremity (UE) Exam    Side: Right upper extremity  Side: Left upper extremity  Inspection: No masses, redness, swelling, or asymmetry  Inspection: No masses, redness, swelling, or asymmetry  Functional ROM: Unrestricted ROM          Functional ROM: Unrestricted ROM          Muscle strength & Tone: Functionally intact  Muscle strength & Tone: Functionally intact  Sensory: Unimpaired  Sensory: Unimpaired  Palpation: Non-contributory  Palpation: Non-contributory   Thoracic Spine Exam  Inspection: No masses, redness, or swelling Alignment: Symmetrical Functional ROM: Unrestricted ROM Stability: No instability detected Sensory: Unimpaired Muscle strength & Tone: Functionally intact Palpation: Non-contributory  Lumbar Spine Exam  Inspection: No masses, redness, or swelling Alignment: Symmetrical Functional ROM: Unrestricted ROM Stability: No instability detected Muscle strength & Tone: Functionally intact Sensory: Unimpaired Palpation: Non-contributory Provocative Tests: Lumbar Hyperextension and rotation test: evaluation deferred today       Patrick's Maneuver: evaluation deferred today              Gait & Posture Assessment  Ambulation: Unassisted Gait: Relatively normal for age and body habitus Posture: WNL   Lower Extremity Exam    Side: Right lower extremity  Side: Left lower extremity  Inspection: No  masses, redness, swelling, or asymmetry  Inspection: No masses, redness, swelling, or asymmetry  Functional ROM: Unrestricted ROM          Functional ROM: Unrestricted ROM          Muscle strength & Tone: Functionally intact  Muscle strength & Tone: Functionally intact  Sensory: Unimpaired  Sensory: Unimpaired  Palpation: Non-contributory  Palpation: Non-contributory   Assessment  Primary Diagnosis & Pertinent Problem List: The primary encounter diagnosis was Chronic pain syndrome. Diagnoses of Long term current use of opiate analgesic, Opiate use (75 MME/Day), Chronic low back pain (Location of Primary Source of Pain) (Bilateral) (R>L), Spinal stenosis of lumbar region without neurogenic claudication, and Subacute lumbar radiculopathy (left side) (S1 dermatome) were also pertinent to this visit.  Status Diagnosis   Stable  Stable  Stable 1. Chronic pain syndrome   2. Long term current use of opiate analgesic   3. Opiate use (75 MME/Day)   4. Chronic low back pain (Location of Primary Source of Pain) (Bilateral) (R>L)   5. 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 Subacute lumbar radiculopathy (left side) (S1 dermatome)      Plan of Care  Pharmacotherapy (Medications Ordered): Meds ordered this encounter  Medications  . Oxycodone HCl 10 MG TABS    Sig: Take 1 tablet (10 mg total) by mouth 5 (five) times daily as needed.    Dispense:  150 tablet    Refill:  0    Do not add this medication to the electronic "Automatic Refill" notification system. Patient may have prescription filled one day early if pharmacy is closed on scheduled refill date. Do not fill until: 01/07/16 To last until: 02/06/16   New Prescriptions   No medications on file   Medications administered today: Lori Mckenzie had no medications administered during this visit. Lab-work, procedure(s), and/or referral(s): Orders Placed This Encounter  Procedures  . ToxASSURE Select 13 (MW), Urine    Imaging and/or referral(s): None  Interventional therapies: Planned, scheduled, and/or pending: None at this time.    Considering:   Diagnostic bilateral lumbar facet block under fluoroscopic guidance and IV sedation.  Possible  bilateral lumbar facet radiofrequency ablation under fluoroscopic guidance and IV sedation.  Diagnostic bilateral cervical facet block under fluoroscopic guidance and IV sedation.  Diagnostic L1-2 lumbar epidural steroid injection under fluoroscopic guidance, with or without sedation.  Diagnostic, right-sided L4-5 lumbar epidural steroid injection under fluoroscopic guidance and IV sedation.  Diagnostic bilateral intra-articular shoulder joint injection under fluoroscopic guidance, with or without sedation.  Diagnostic bilateral suprascapular nerve block under fluoroscopic guidance, with or without sedation.  Possible bilateral suprascapular nerve radiofrequency ablation under fluoroscopic guidance and IV sedation.  Palliative left-sided L5-S1 lumbar epidural steroid injection under fluoroscopic guidance him a with or without sedation.  Diagnostic left S1 selective nerve root block under fluoroscopic guidance, with or without sedation.  Diagnostic right-sided cervical epidural steroid injection under fluoroscopic guidance, with or without sedation.  Diagnostic T10-11 thoracic epidural steroid injection under fluoroscopic guidance, with or without sedation.  Diagnostic bilateral intra-articular hip joint injection under fluoroscopic guidance, with or without sedation.  Possible bilateral hip radiofrequency ablation under fluoroscopic guidance and IV sedation.    Palliative PRN treatment(s):   Diagnostic bilateral lumbar facet block under fluoroscopic guidance and IV sedation.  Diagnostic bilateral cervical facet block under fluoroscopic guidance and IV sedation.  Diagnostic L1-2 lumbar epidural steroid injection under fluoroscopic guidance, with or without  sedation.  Diagnostic, right-sided L4-5 lumbar epidural steroid injection under fluoroscopic guidance and IV sedation.  Diagnostic bilateral intra-articular shoulder joint injection under fluoroscopic guidance, with or without sedation.  Diagnostic bilateral suprascapular nerve block under fluoroscopic guidance, with or without sedation.  Palliative left-sided L5-S1 lumbar epidural steroid injection under fluoroscopic guidance him a with or without sedation.  Diagnostic left S1 selective nerve root block under fluoroscopic guidance, with or without sedation.  Diagnostic right-sided cervical epidural steroid injection under fluoroscopic guidance, with or without sedation.  Diagnostic T10-11 thoracic epidural steroid injection under fluoroscopic guidance, with or without sedation.  Diagnostic bilateral intra-articular hip joint injection under fluoroscopic guidance, with or without sedation.    Provider-requested follow-up: Return in about 1 month (around 02/07/2016) for (MD) Med-Mgmt.  Future Appointments Date Time Provider [place]  02/09/2016 1:30 PM Milinda Pointer, MD [facility] None   Primary Care Physician: Ardine Eng, MD Location: [facility] Outpatient Pain Management Facility Note by: Kathlen Brunswick. Dossie Arbour, M.D, DABA, DABAPM, DABPM, DABIPP, FIPP Date: 01/07/16; Time: 11:36 AM  Pain Score Disclaimer: We use the NRS-11 scale. This is a self-reported, subjective measurement of pain severity with only modest accuracy. It is used primarily to identify changes within a particular patient. It must be understood that outpatient pain scales are significantly less accurate that those used for research, where they can be applied under ideal controlled circumstances with minimal exposure to variables. In reality, the score is likely to be a combination of pain intensity and pain affect, where pain affect describes the degree of emotional arousal or changes in action readiness caused by the sensory  experience of pain. Factors such as social and work situation, setting, emotional state, anxiety levels, expectation, and prior pain experience may influence pain perception and show large inter-individual differences that may also be affected by time variables.  Patient instructions provided during this appointment: Patient Instructions  Hope you feel better soon!!   Remember to bring your medications next appointment for [place] to count. Also bring empty bottle.Pain Management Discharge Instructions  General Discharge Instructions :  If you need to reach your doctor call: Monday-Friday 8:00 am - 4:00 pm at [phone] or toll free [phone].  After  clinic hours [phone] to have operator reach doctor.  Bring all of your medication bottles to all your appointments in the pain clinic.  To cancel or reschedule your appointment with Pain Management please remember to call 24 hours in advance to avoid a fee.  Refer to the educational materials which you have been given on: General Risks, I had my Procedure. Discharge Instructions, Post Sedation.  Post Procedure Instructions:  The drugs you were given will stay in your system until tomorrow, so for the next 24 hours you should not drive, make any legal decisions or drink any alcoholic beverages.  You may eat anything you prefer, but it is better to start with liquids then soups and crackers, and gradually work up to solid foods.  Please notify your doctor immediately if you have any unusual bleeding, trouble breathing or pain that is not related to your normal pain.  Depending on the type of procedure that was done, some parts of your body may feel week and/or numb.  This usually clears up by tonight or the next day.  Walk with the use of an assistive device or accompanied by an adult for the 24 hours.  You may use ice on the affected area for the first 24 hours.  Put ice in a Ziploc bag and cover with a towel and place against area 15  minutes on 15 minutes off.  You may switch to heat after 24 hours.

## 2016-01-08 IMAGING — CT CT ABD-PELV W/ CM
1 of 3 series · 13 of 32 positions shown, 18 images · IV contrast (omnipaque)
Comparison: 08/12/2014.

CLINICAL DATA: Status post laparoscopic cholecystectomy on
07/15/2014, status post ERCP with stent placement for bile leak on
07/16/2014, status post percutaneous drainage for gallbladder fossa
abscess on 08/04/2014, with drain removal on 08/21/2014. Now with
increased abdominal distention, pain, and fever. No further drainage
from abdominal wall puncture site.

EXAM:
CT ABDOMEN AND PELVIS WITH CONTRAST
TECHNIQUE: Multidetector CT imaging of the abdomen and pelvis was performed
using the standard protocol following bolus administration of
intravenous contrast.
CONTRAST:  125mL OMNIPAQUE IOHEXOL 300 MG/ML  SOLN

[Series 2: routine abd pel with · axial · 0.90mm/px · z∈[-592,-162]mm · 13 of 98 slices shown, 18 images]
[im 6/98  soft-tissue]
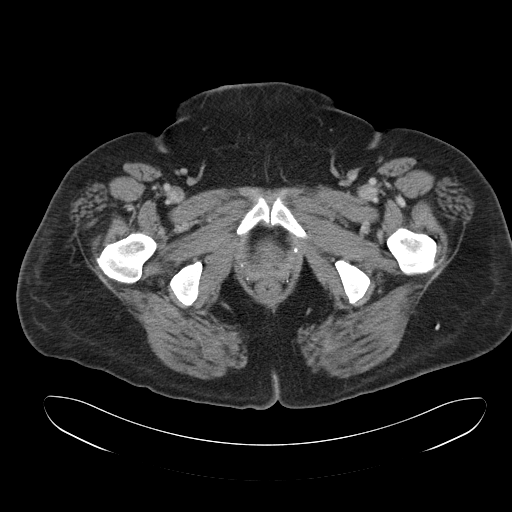
[im 6/98  bone]
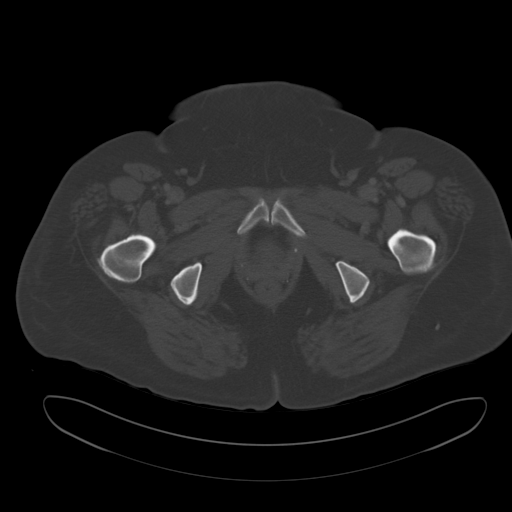
[im 17/98  soft-tissue]
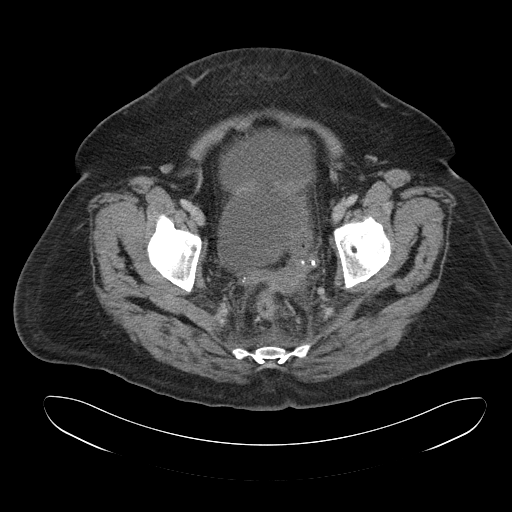
[im 22/98  soft-tissue]
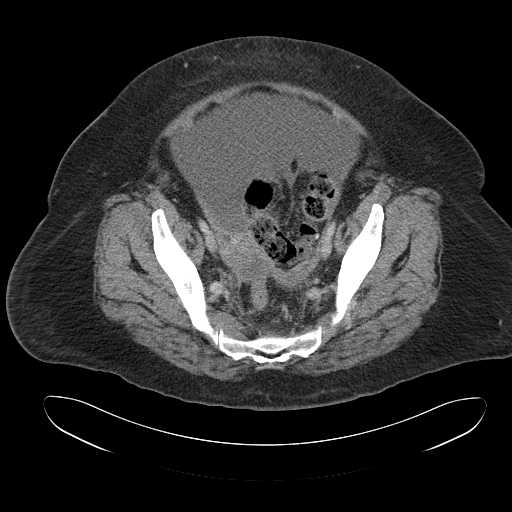
[im 27/98  soft-tissue]
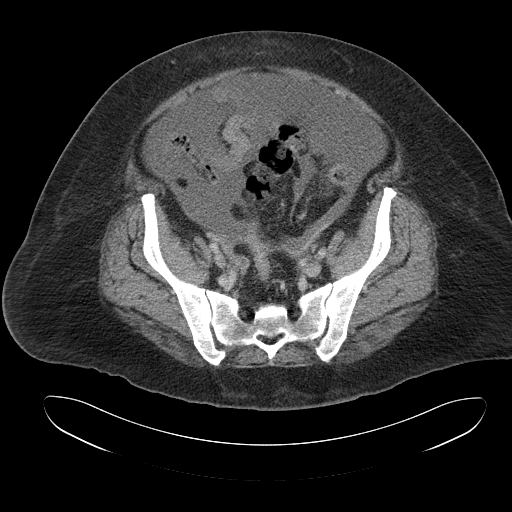
[im 38/98  soft-tissue]
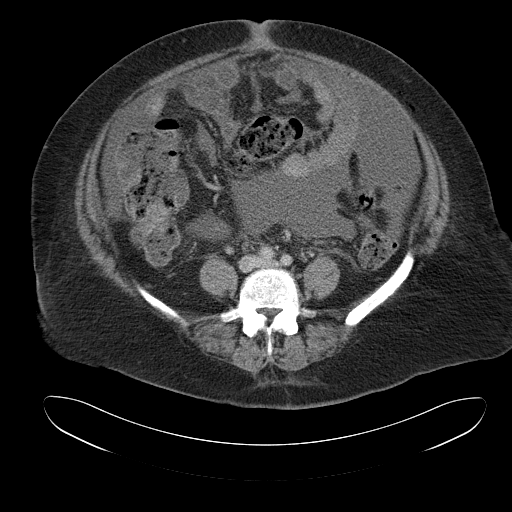
[im 44/98  soft-tissue]
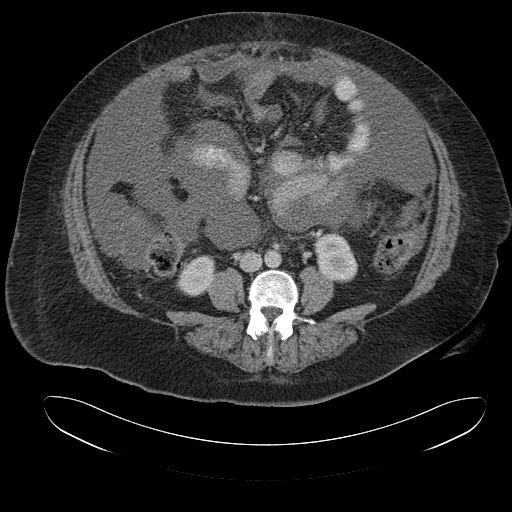
[im 54/98  soft-tissue]
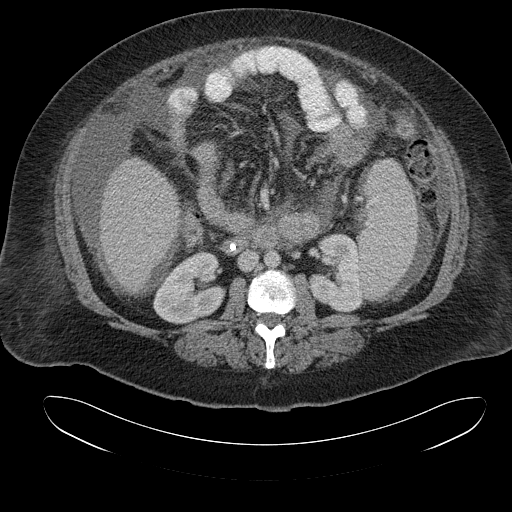
[im 60/98  soft-tissue]
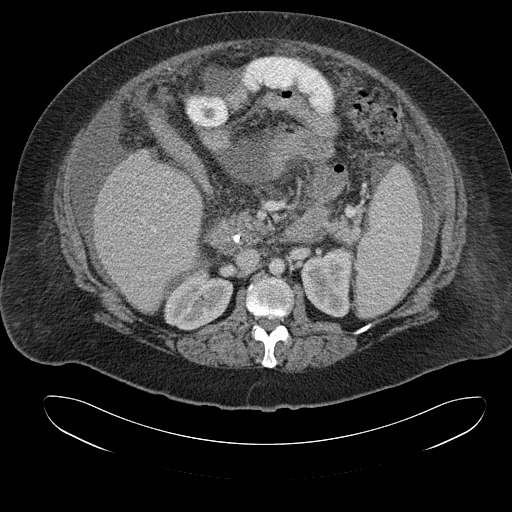
[im 71/98  soft-tissue]
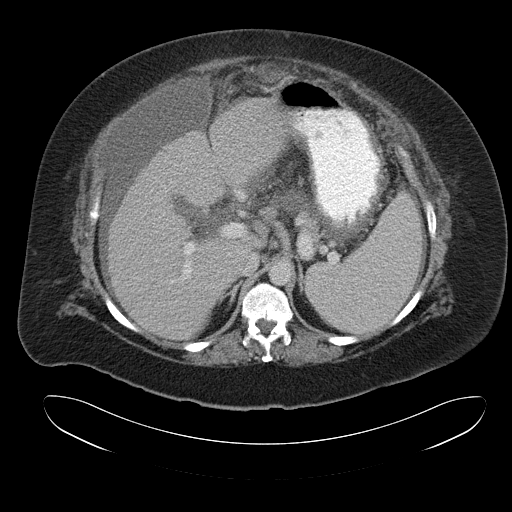
[im 71/98  bone]
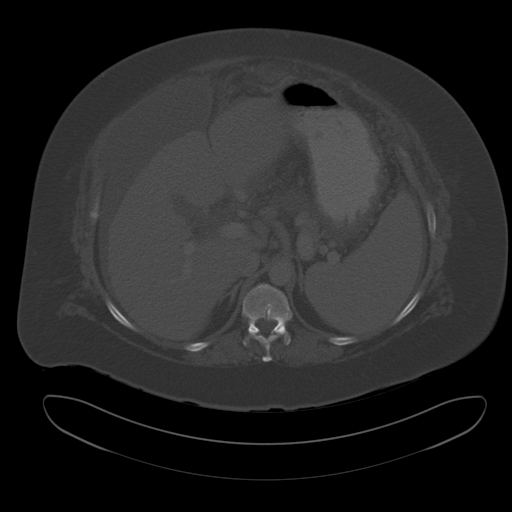
[im 76/98  soft-tissue]
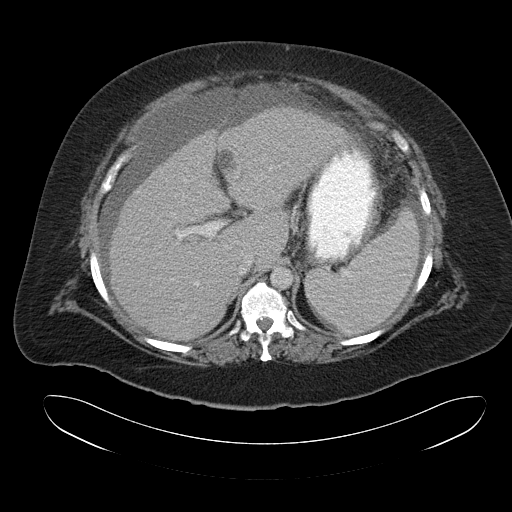
[im 76/98  lung]
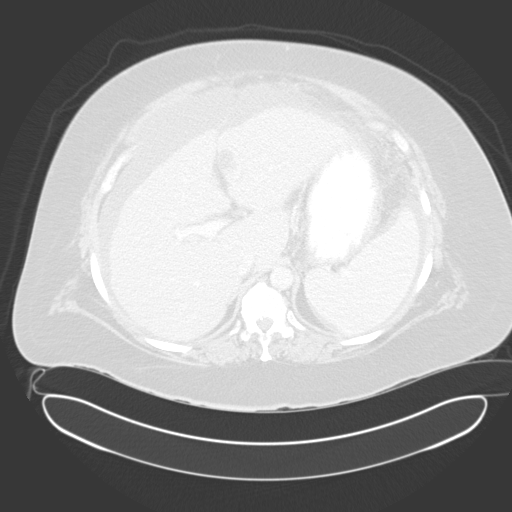
[im 81/98  soft-tissue]
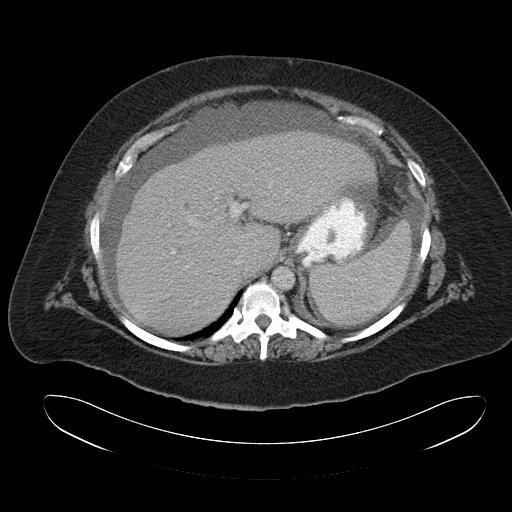
[im 81/98  lung]
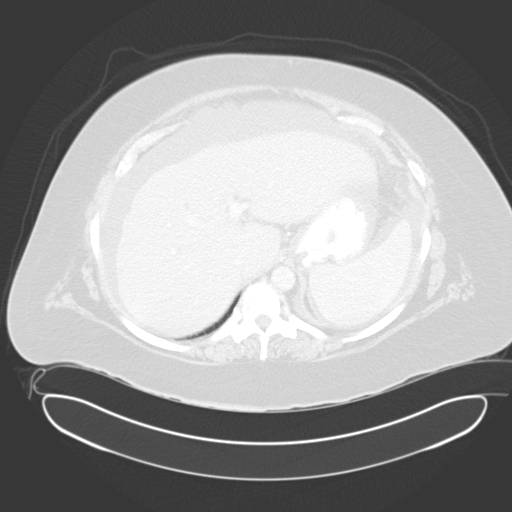
[im 87/98  lung]
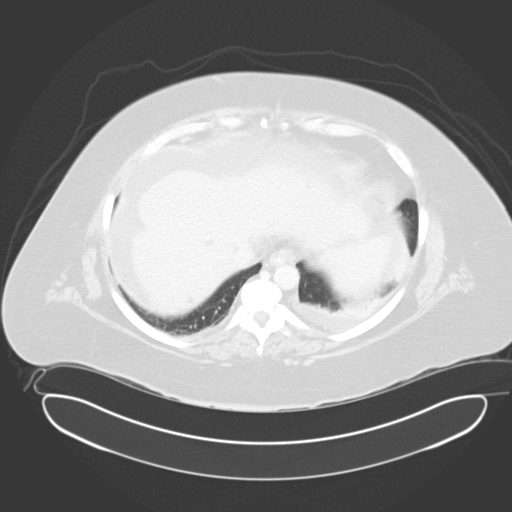
[im 92/98  soft-tissue]
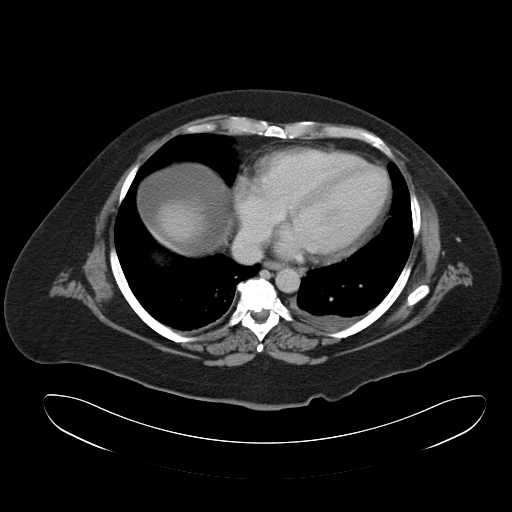
[im 92/98  lung]
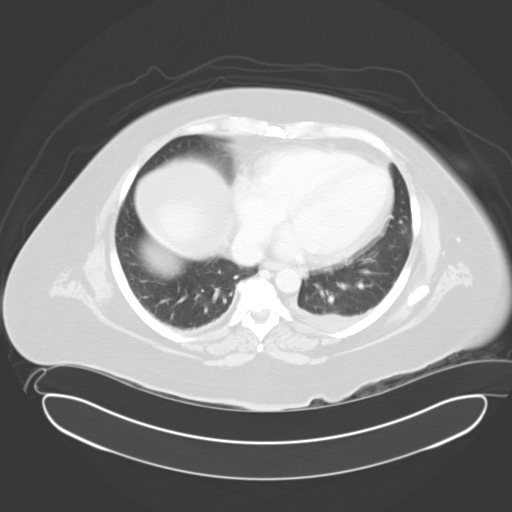

[13 of 32 positions shown; findings below may reference images not displayed]

FINDINGS: Lower chest: Small left and trace right pleural effusions.
Associated left lower lobe atelectasis.

Hepatobiliary: Liver is notable for scattered hepatic cysts
measuring up to 1.8 cm in the medial segment left hepatic lobe
(series 2/ image 22).

Status post cholecystectomy.

3.0 x 3.1 cm fluid collection/seroma in the surgical bed, minimally
complicated by hemorrhage along its lateral margin (series 2/ image
31) interval removal of pigtail drainage catheter.

No intrahepatic or extrahepatic ductal dilatation. Biliary drain in
the distal common duct, terminating in the duodenum.

Pancreas: Within normal limits.

Spleen: Within normal limits.

Adrenals/Urinary Tract: Adrenal glands are within normal limits.

Kidneys within normal limits.  No hydronephrosis.

Bladder is within normal limits.

Stomach/Bowel: Stomach is within normal limits.

No evidence of bowel obstruction.

Mild colonic diverticulosis, without evidence of diverticulitis.

Vascular/Lymphatic: No evidence of abdominal aortic aneurysm.

No suspicious abdominopelvic lymphadenopathy.

Reproductive: Status post hysterectomy.

Bilateral ovaries are poorly visualized but grossly unremarkable.

Loculated pelvic ascites in the dependent pelvis measuring 2.7 x
cm with thin enhancing rim (series 2/ image 39).

Other: Additional moderate abdominopelvic ascites.

Musculoskeletal: Degenerative changes of the visualized
thoracolumbar spine.
IMPRESSION: Status post cholecystectomy.

Interval removal of pigtail drainage catheter with residual 3.0 x
3.1 cm fluid collection/seroma in the surgical bed.

No intrahepatic/ extrahepatic ductal dilatation. Distal CBD drain
terminates in the duodenum.

Moderate abdominopelvic ascites. Loculated dependent pelvic ascites
with thin enhancing rim measuring 2.7 x 3.8 cm.

## 2016-01-09 IMAGING — CR DG SHOULDER 2+V*R*
1 series · 2 of 2 positions shown · non-contrast
Comparison: None.

CLINICAL DATA: Right shoulder pain.

EXAM:
RIGHT SHOULDER - 2+ VIEW

[Series 1: ap · 0.17mm/px · 2 of 2 slices shown]
[im 1/2]
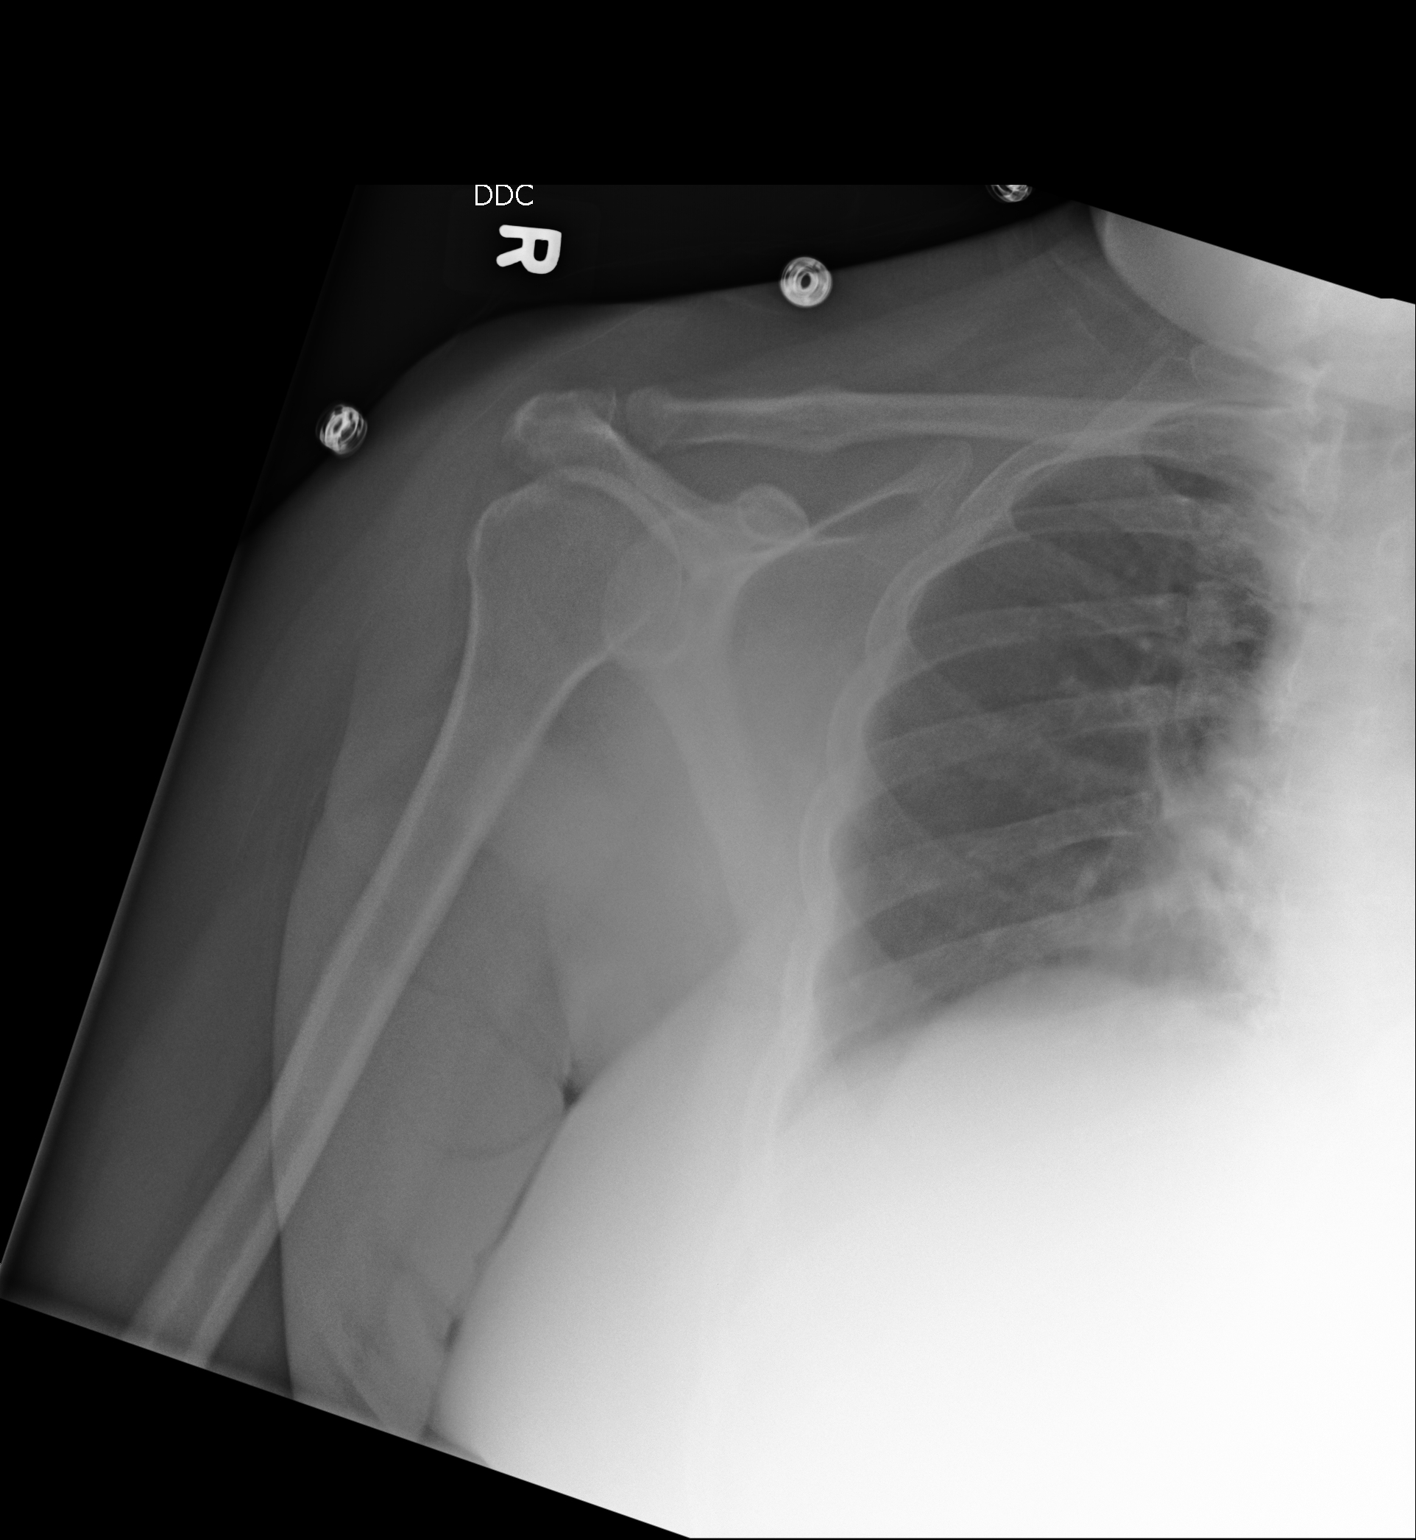
[im 2/2]
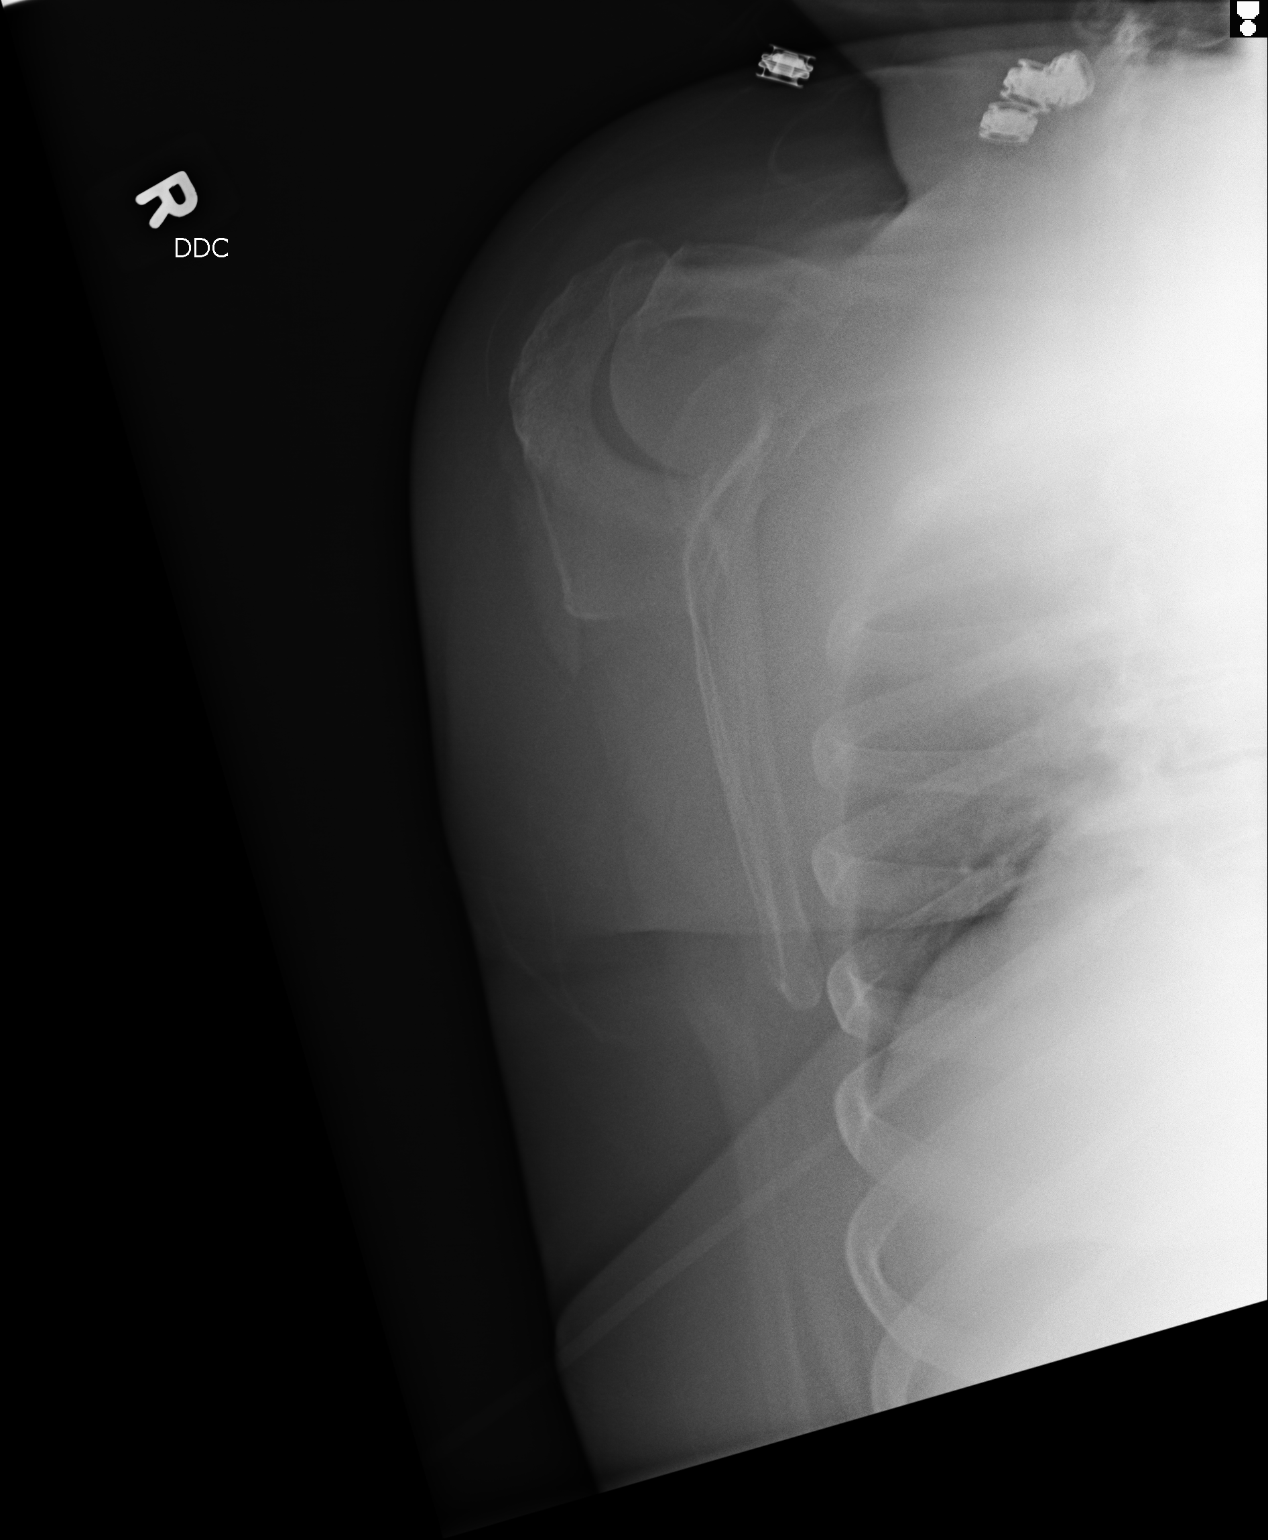

[2 of 2 positions shown; findings below may reference images not displayed]

FINDINGS: There is no evidence of fracture or dislocation. There is no
evidence of arthropathy or other focal bone abnormality. Soft
tissues are unremarkable.
IMPRESSION: Negative.

## 2016-01-10 IMAGING — CR DG CHEST 1V PORT
1 series · 1 of 1 positions shown · non-contrast
Comparison: Portable exam 6629 hours compared 08/24/2014

CLINICAL DATA: BILATERAL wheezing, history COPD, diabetes mellitus
with TIA, hypertension

EXAM:
PORTABLE CHEST - 1 VIEW

[portable]
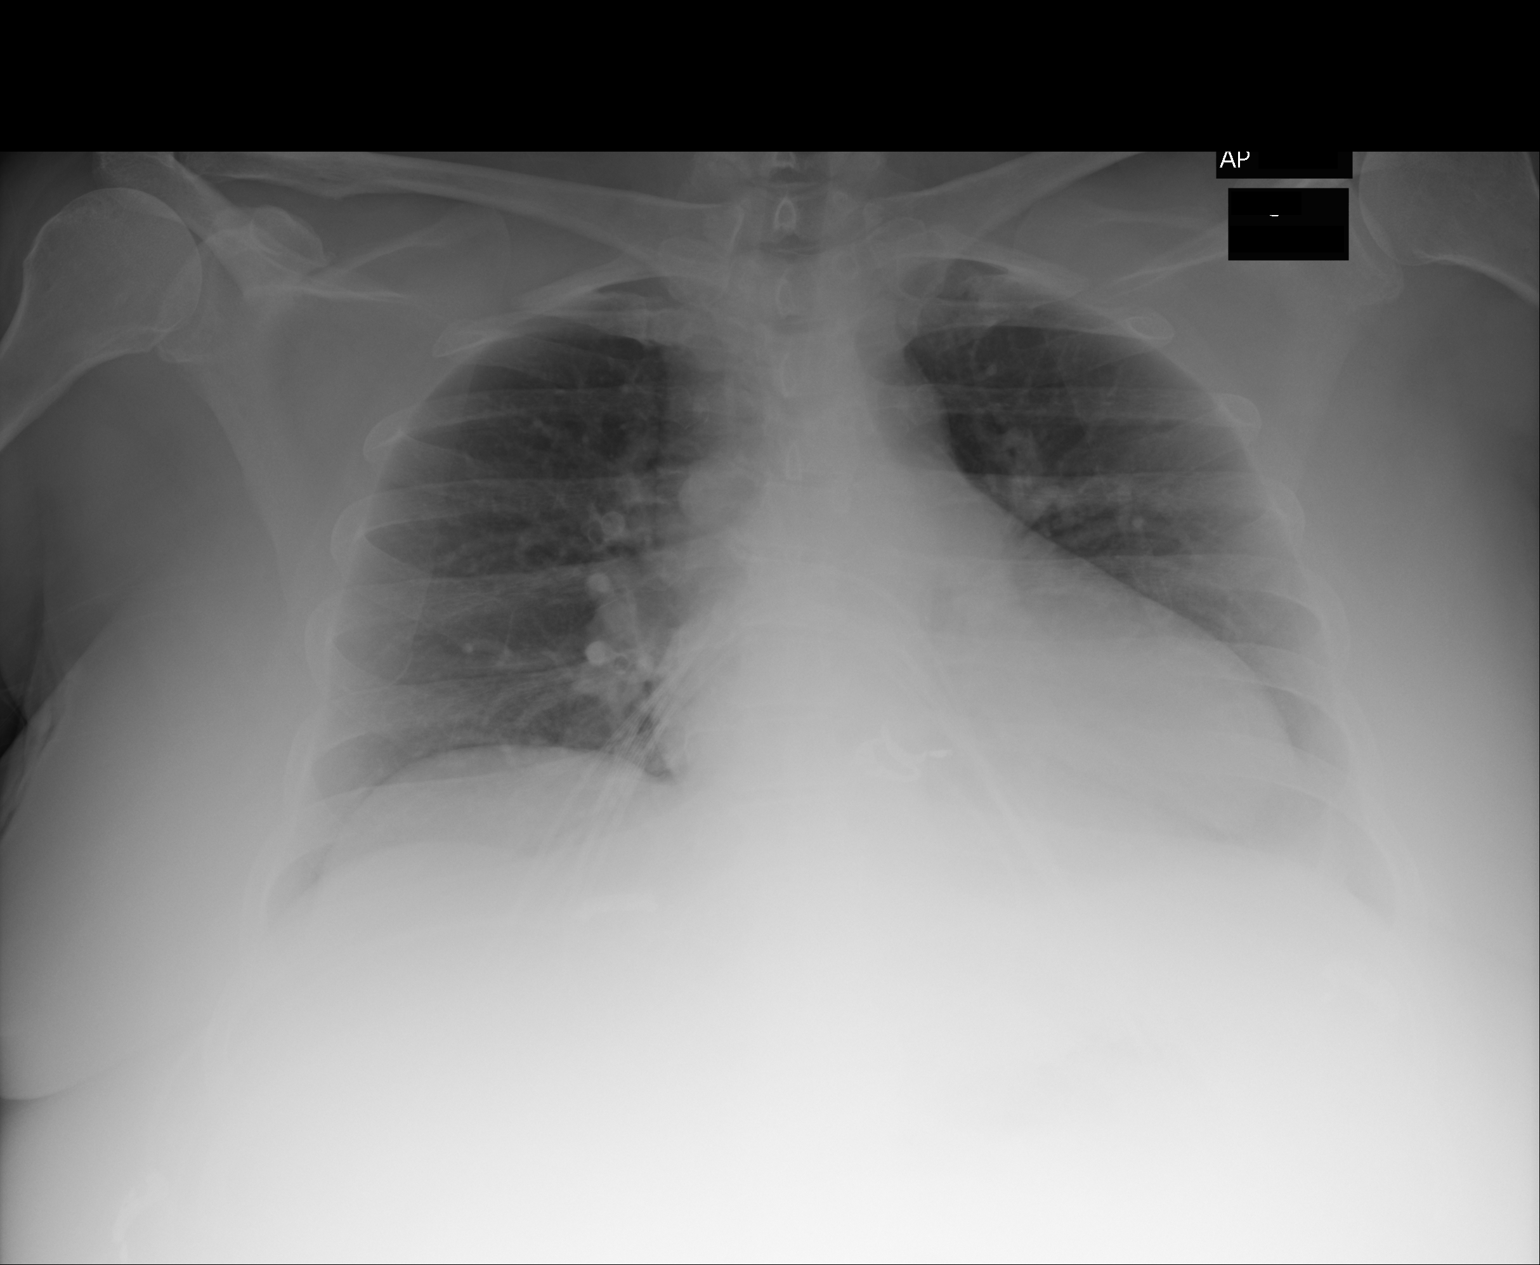

[1 of 1 positions shown; findings below may reference images not displayed]

FINDINGS: Enlargement of cardiac silhouette with vascular congestion.

Mediastinal contours normal.

Central peribronchial thickening.

Lungs clear.

No pleural effusion or pneumothorax.

No acute osseous findings.
IMPRESSION: Enlargement of cardiac silhouette with pulmonary vascular
congestion.

Bronchitic changes without infiltrate.

## 2016-01-12 IMAGING — CT CT ABD-PELV W/ CM
1 of 3 series · 13 of 32 positions shown, 18 images · IV contrast (omnipaque)
Comparison: 08/25/2014 and earlier

CLINICAL DATA: 49-year-old female with increased abdominal pain and
distension status post cholecystectomy in [REDACTED]. Stent placement for
bile leak subsequent. Gallbladder fossa abscess drained earlier this
month. Subsequent encounter.

EXAM:
CT ABDOMEN AND PELVIS WITH CONTRAST
TECHNIQUE: Multidetector CT imaging of the abdomen and pelvis was performed
using the standard protocol following bolus administration of
intravenous contrast.
CONTRAST:  125mL OMNIPAQUE IOHEXOL 300 MG/ML  SOLN

[Series 2: routine abd pel with · axial · 0.90mm/px · z∈[-714,-300]mm · 13 of 95 slices shown, 18 images]
[im 6/95  soft-tissue]
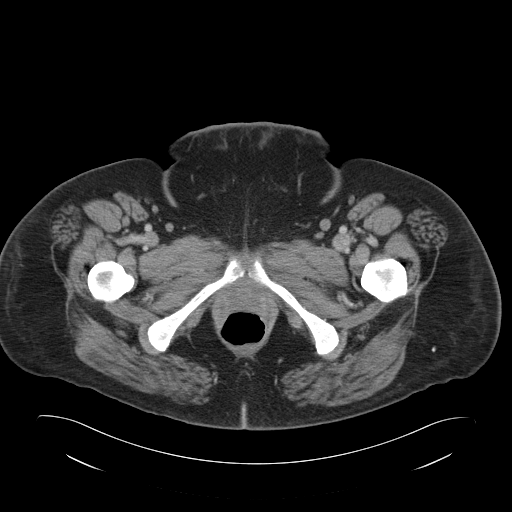
[im 6/95  bone]
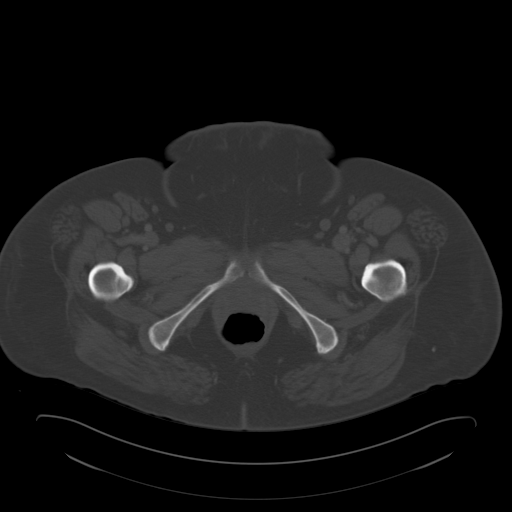
[im 17/95  soft-tissue]
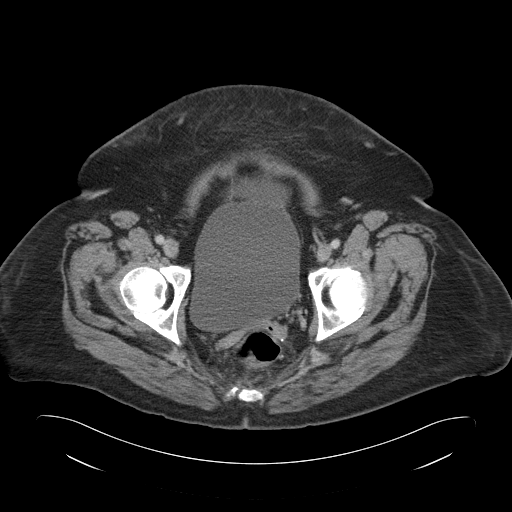
[im 23/95  soft-tissue]
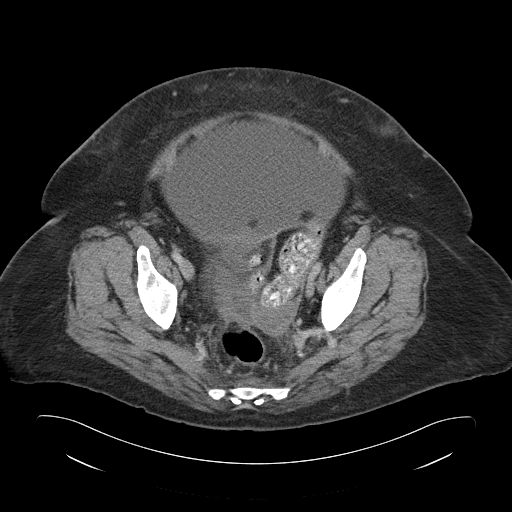
[im 28/95  soft-tissue]
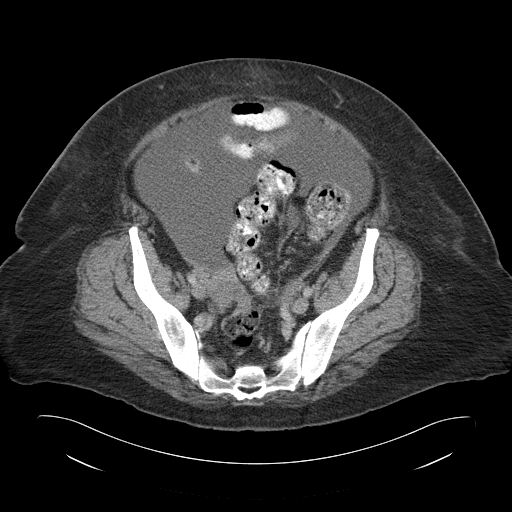
[im 39/95  soft-tissue]
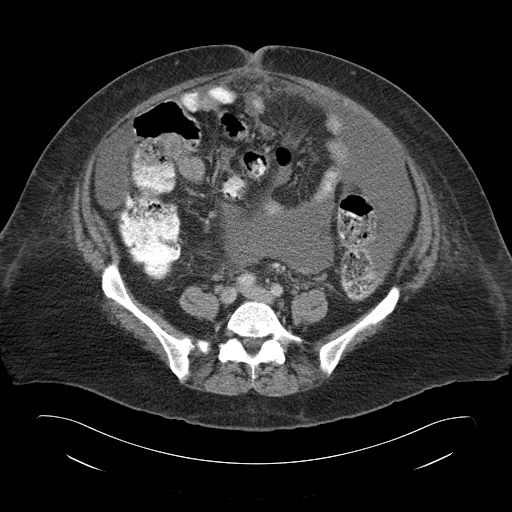
[im 45/95  soft-tissue]
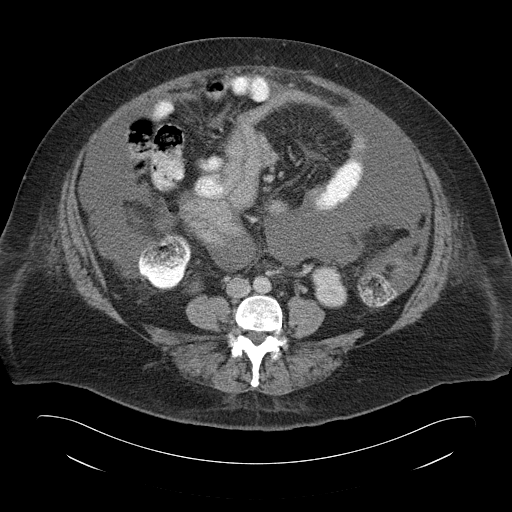
[im 50/95  soft-tissue]
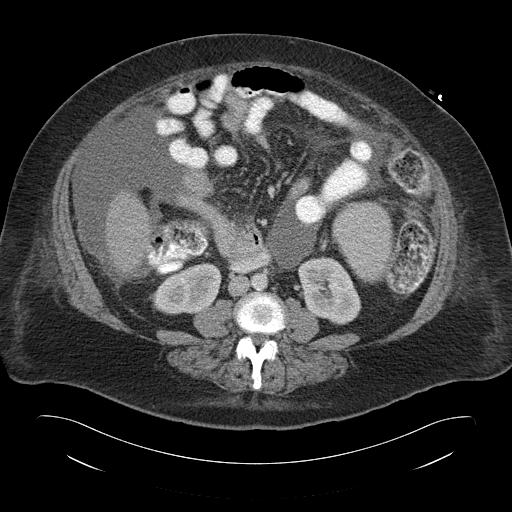
[im 61/95  soft-tissue]
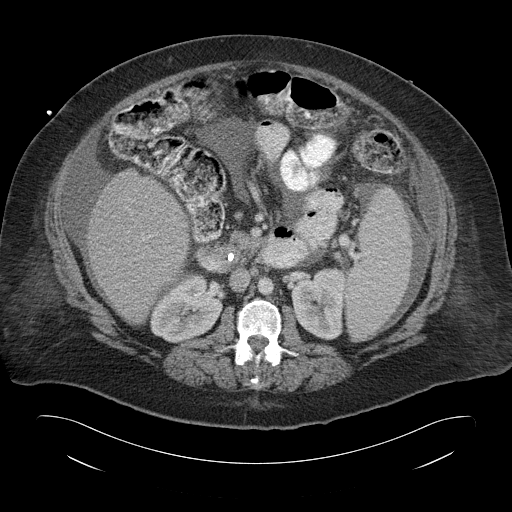
[im 67/95  soft-tissue]
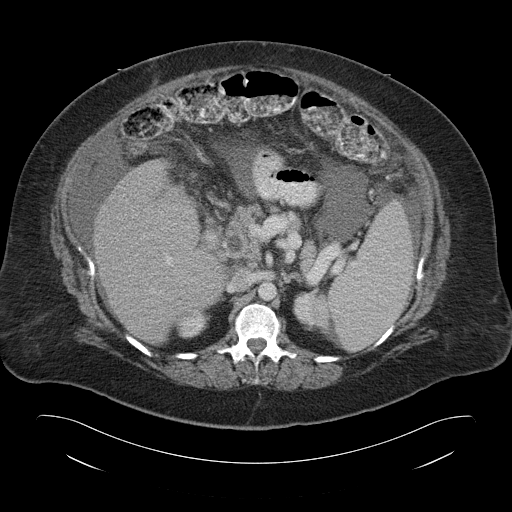
[im 67/95  bone]
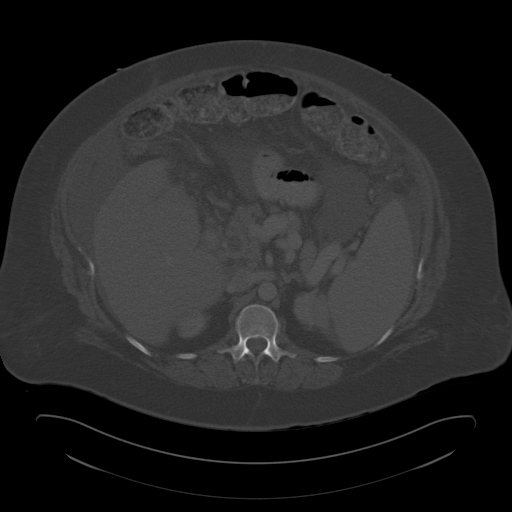
[im 72/95  soft-tissue]
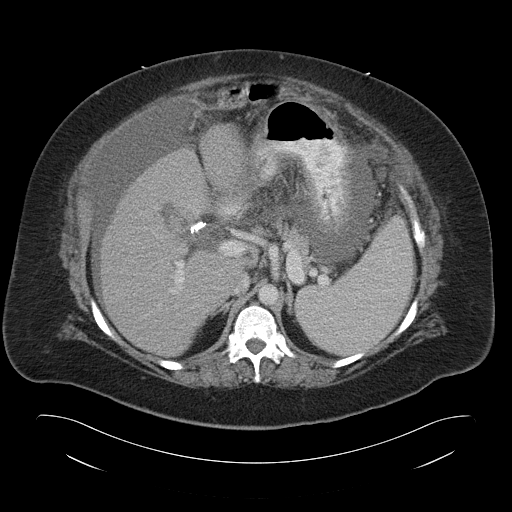
[im 72/95  lung]
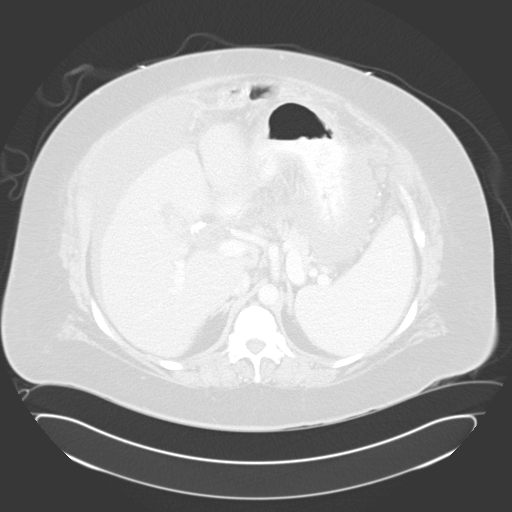
[im 78/95  lung]
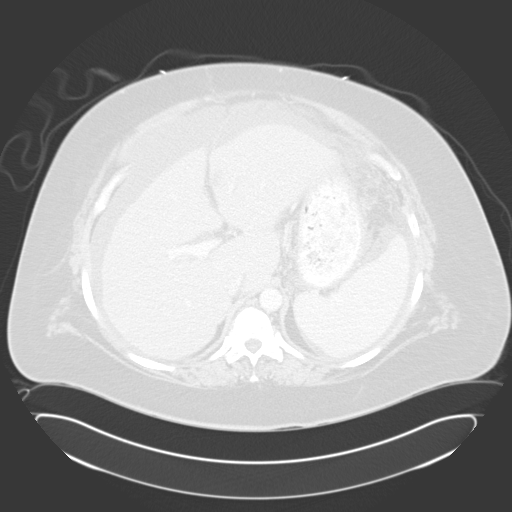
[im 83/95  soft-tissue]
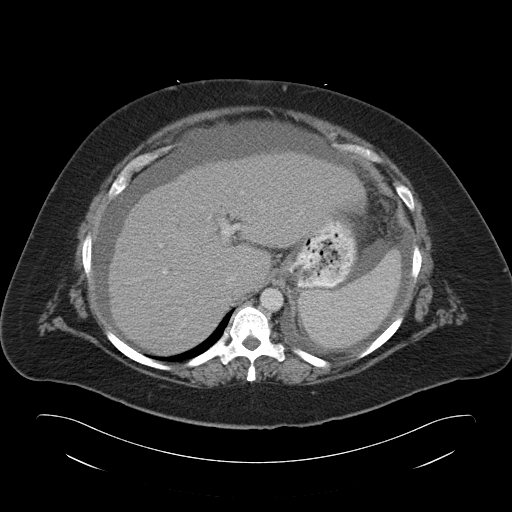
[im 83/95  lung]
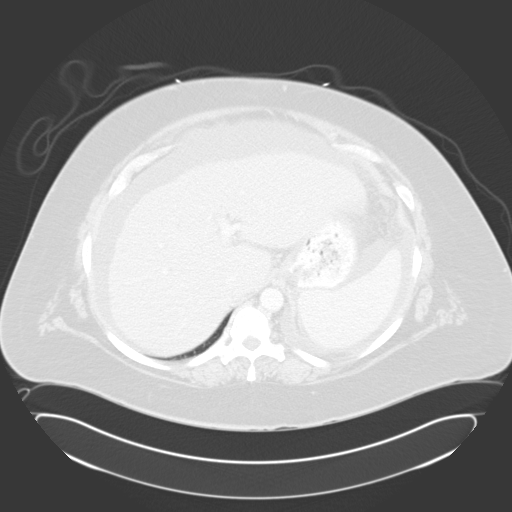
[im 89/95  soft-tissue]
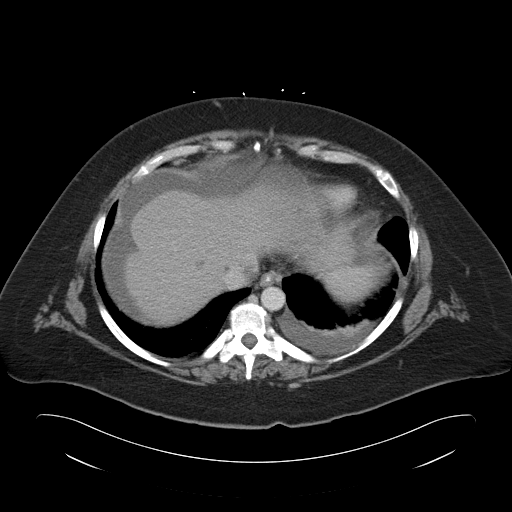
[im 89/95  lung]
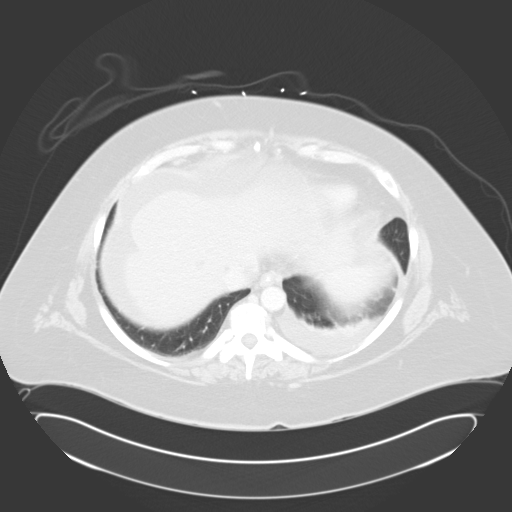

[13 of 32 positions shown; findings below may reference images not displayed]

FINDINGS: Stable mild cardiomegaly. Layering small left pleural effusion has
not significantly changed. Mild dependent lung base atelectasis is
stable.

Degenerative osseous changes in the spine. No acute osseous
abnormality identified.

Negative rectum mildly distended with gas. Mildly distended but
otherwise negative urinary bladder.

Moderate pelvic and abdominal ascites is stable from 4 days ago. As
noted at that time, this is partially loculated, for example with
mild enhancing septations noted about the spleen.

Oral contrast mixed with stool in the sigmoid colon which is
redundant. Retained stool throughout the more proximal colon. Most
small bowel is decompressed in the abdomen. The stomach is
decompressed. There is a biliary stent terminating in the duodenum.
The fourth portion of the duodenum and ligament of Treitz along with
a few proximal small bowel loops are dilated 3.5-4 cm diameter.
There is been a somewhat gradual transition to nondilated loops. No
pneumoperitoneum.

Stable liver. Residual mixed density collection in the gallbladder
fossa has regressed, now 25 mm diameter (previously 30-31 mm). The
proximal CBD and central bile ducts are prominent but stable. Stable
splenomegaly. The portal venous system is patent.

Negative pancreas, no main pancreatic duct dilatation. Negative
adrenal glands. Renal contrast enhancement and excretion is
symmetric and within normal limits. Major arterial structures are
patent. No lymphadenopathy.

Mildly increased subcutaneous fat stranding along the panniculus.
IMPRESSION: 1. Smaller mixed density fluid collection in the gallbladder fossa
since 4 days ago following percutaneous drain removal. No evidence
of abscess recurrence at this time.
2. Interval mildly dilated ligament of Treitz and proximal small
bowel loops, but no transition point or obstructing lesion. Favor
ileus.
3. Otherwise stable abdomen and pelvis since 08/25/2014, including
moderate volume loculated ascites which might be the sequelae of
bile peritonitis.
4. Stable small left pleural effusion.

## 2016-01-13 LAB — TOXASSURE SELECT 13 (MW), URINE

## 2016-01-18 IMAGING — CT CT ABD-PELV W/ CM
1 of 3 series · 13 of 32 positions shown, 18 images · IV contrast (omnipaque)
Comparison: CT of the abdomen and pelvis performed 08/29/2014

CLINICAL DATA: Increasing abdominal girth and weight gain, with
fever. Diffuse abdominal pain. Recent cholecystectomy and bile leak.
Initial encounter.

EXAM:
CT ABDOMEN AND PELVIS WITH CONTRAST
TECHNIQUE: Multidetector CT imaging of the abdomen and pelvis was performed
using the standard protocol following bolus administration of
intravenous contrast.
CONTRAST:  100mL OMNIPAQUE IOHEXOL 300 MG/ML  SOLN

[Series 2: routine abd pel with · axial · 0.96mm/px · z∈[-1468,-1023]mm · 13 of 101 slices shown, 18 images]
[im 6/101  soft-tissue]
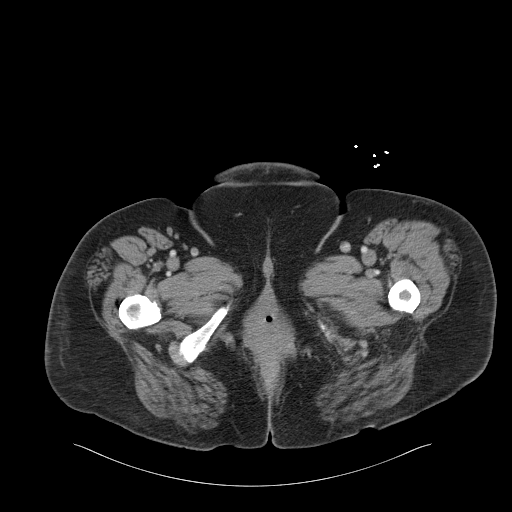
[im 6/101  bone]
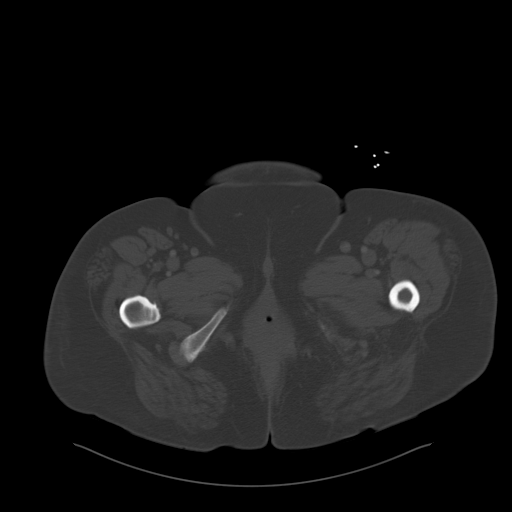
[im 16/101  soft-tissue]
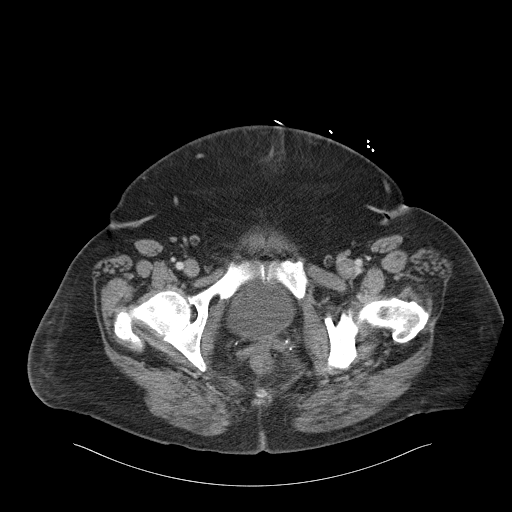
[im 22/101  soft-tissue]
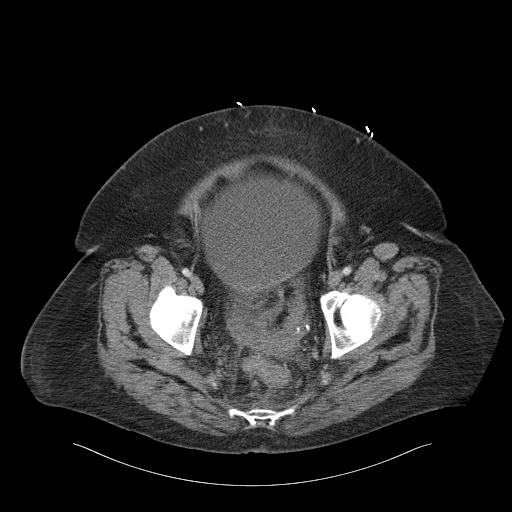
[im 32/101  soft-tissue]
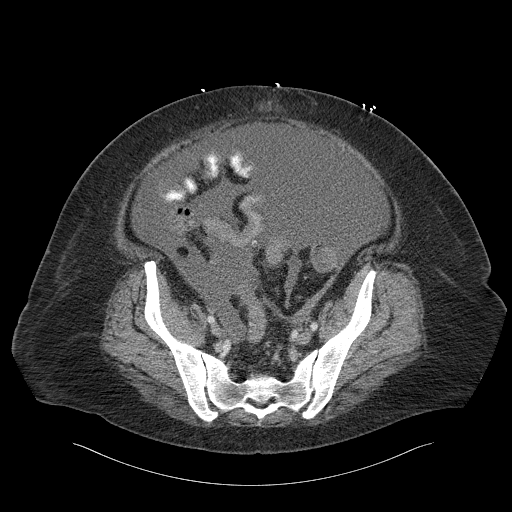
[im 37/101  soft-tissue]
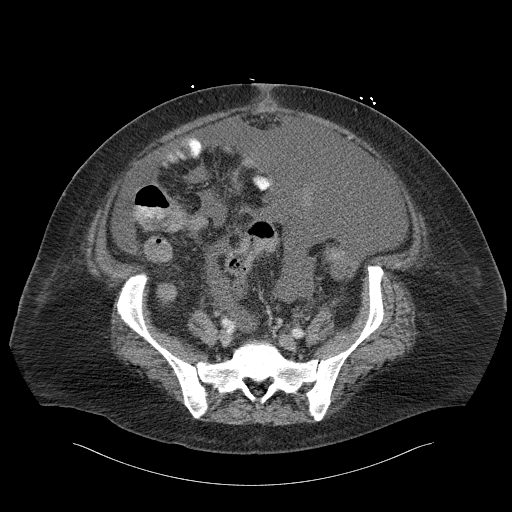
[im 48/101  soft-tissue]
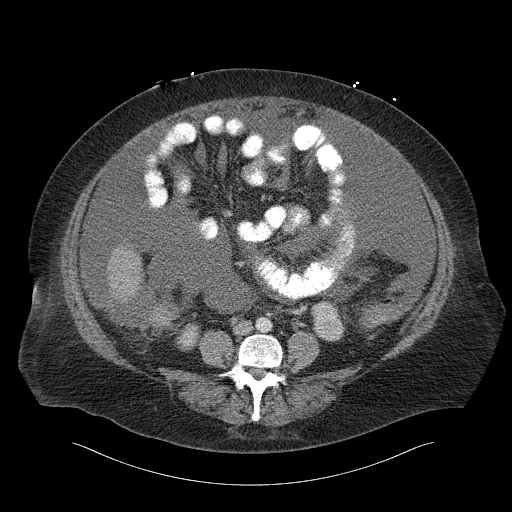
[im 53/101  soft-tissue]
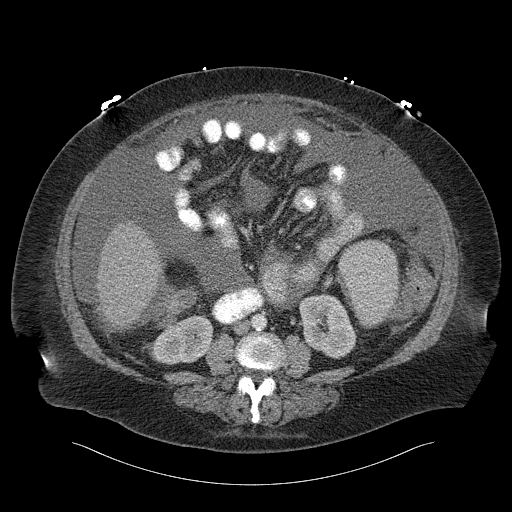
[im 64/101  soft-tissue]
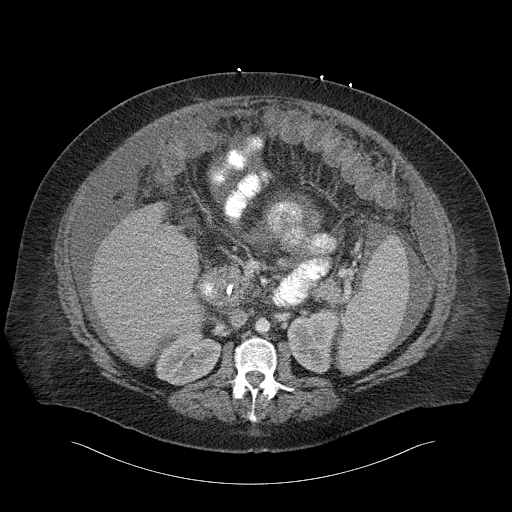
[im 69/101  soft-tissue]
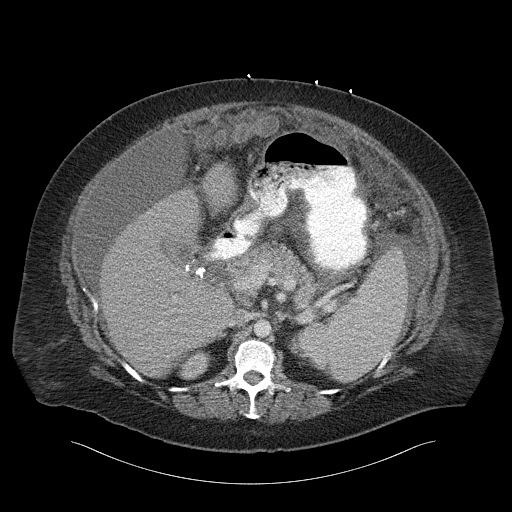
[im 69/101  bone]
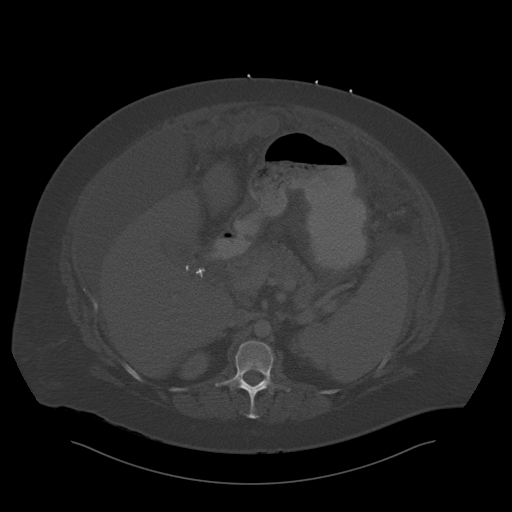
[im 79/101  soft-tissue]
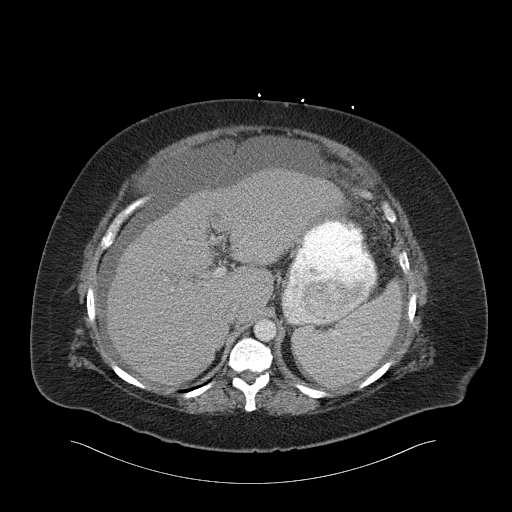
[im 79/101  lung]
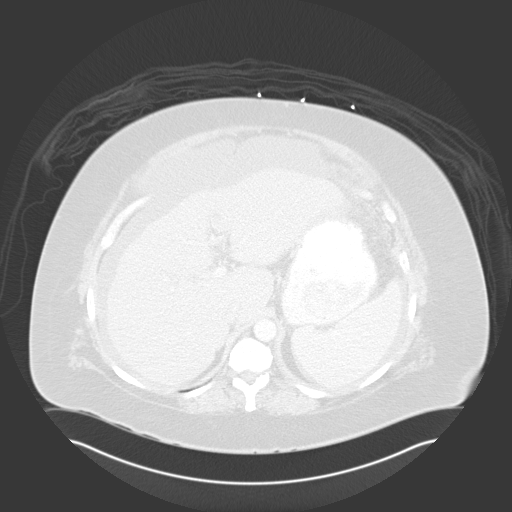
[im 85/101  soft-tissue]
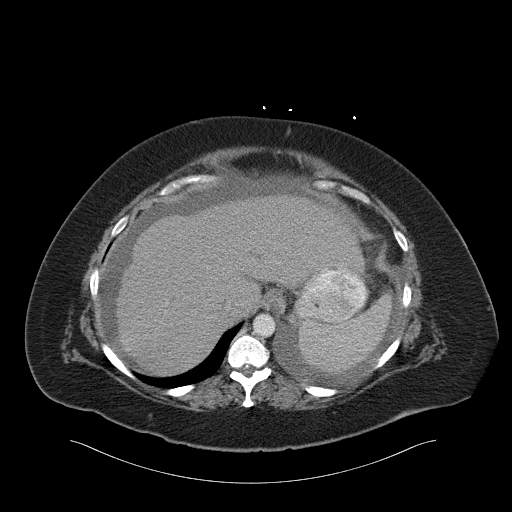
[im 85/101  lung]
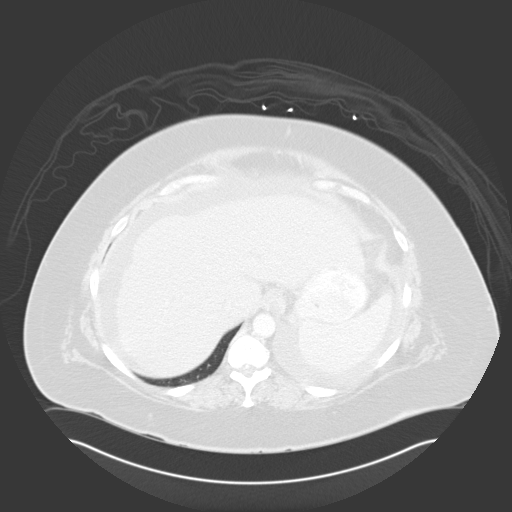
[im 90/101  lung]
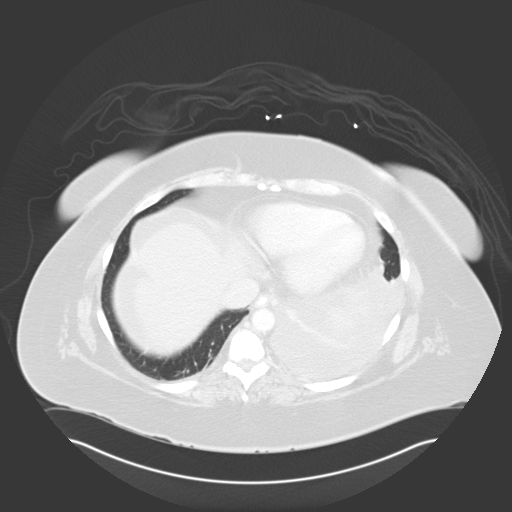
[im 95/101  soft-tissue]
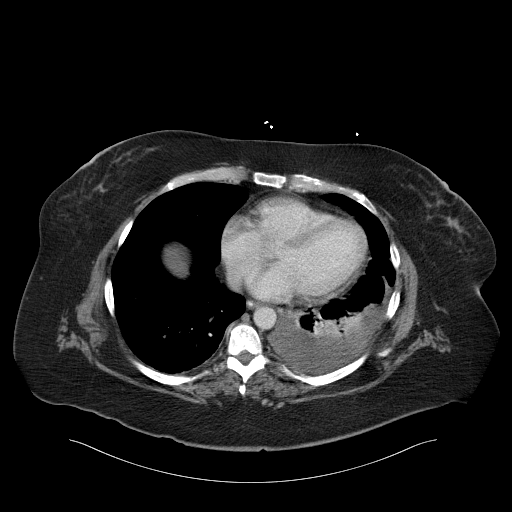
[im 95/101  lung]
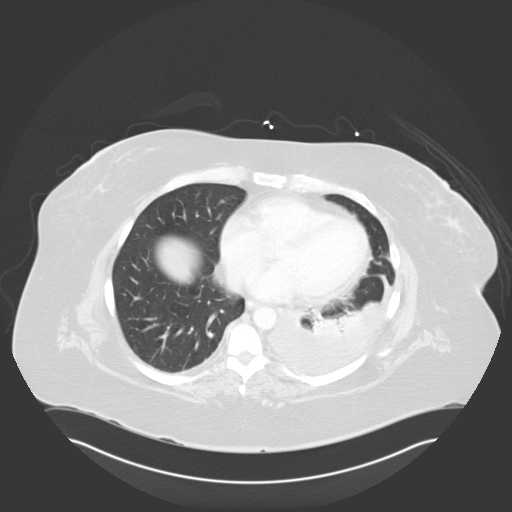

[13 of 32 positions shown; findings below may reference images not displayed]

FINDINGS: A small left pleural effusion is noted, with associated atelectasis.

Moderate to large volume ascites within the abdomen and pelvis
appears mildly worsened from the prior study.

Fluid at the gallbladder fossa is slightly less apparent than on the
prior study. There is no evidence of recurrent bile leak. Scattered
clips are seen about the gallbladder fossa. The spleen is enlarged,
measuring 18.4 cm in length. Hypodensities within the left hepatic
lobe measure up to 1.9 cm in size, nonspecific in nature. The mildly
nodular contour of the liver raises concern for hepatic cirrhosis.

The pancreas and adrenal glands are grossly unremarkable. A
pancreatic duct stent is noted ending at the second segment of the
duodenum.

The kidneys are unremarkable in appearance. There is no evidence of
hydronephrosis. No renal or ureteral stones are seen. No perinephric
stranding is appreciated.

No free fluid is identified. The small bowel is unremarkable in
appearance. The stomach is within normal limits. No acute vascular
abnormalities are seen.

Vague nonspecific edema is noted within the omentum. This may be
related to the patient's cirrhosis.

The appendix is normal in caliber, without evidence of appendicitis.
The colon is largely decompressed and is unremarkable in appearance.

The bladder is mildly distended and grossly unremarkable. The
patient is status post hysterectomy. No suspicious adnexal masses
are seen. No inguinal lymphadenopathy is seen.

No acute osseous abnormalities are identified.
IMPRESSION: 1. Moderate to large volume ascites within the abdomen and pelvis is
mildly increased from the prior study.
2. No evidence of recurrent bile leak.
3. Findings suggestive of hepatic cirrhosis. Underlying splenomegaly
noted. Nonspecific hypodensities within the left hepatic lobe
measure up to 1.9 cm in size.
4. Vague nonspecific soft tissue edema within the omentum. This may
be related to the patient's cirrhosis.
5. Small left pleural effusion noted, with associated atelectasis.

## 2016-01-22 ENCOUNTER — Other Ambulatory Visit: Payer: Self-pay | Admitting: Pain Medicine

## 2016-01-22 DIAGNOSIS — E559 Vitamin D deficiency, unspecified: Secondary | ICD-10-CM

## 2016-02-03 IMAGING — US US PARACENTESIS
1 series · 12 of 12 positions shown · non-contrast
Comparison: none

CLINICAL DATA: persistent abdominal pain. Patient had
cholecystectomy done laparoscopically couple of months ago now,
which was complicated by a bile leak at the time. On follow-up
evaluation, the bile drain completely from her abdomen, and drains
removed, and the patient has been healing routinely since. Around
the same time as her cholecystectomy, she had a liver biopsy for the
development of ascites, which showed NASH. Since her surgery, her
ascites has continued to build more dramatically. She came to the ED
today for the same plus a report of fevers at home. On evaluation in
the ED today her workup was largely benign, with a normal white
count and labs otherwise largely normal except for hypokalemia with
potassium of 2.6. CT abdomen and pelvis does not show any signs of
recurrent or persistent bile leakage or bile in her abdominal
cavity. It does show significant ascites slightly greater than prior
imaging. Surgery was consult is from the ED and does not feel this
issue is related to her prior surgery or that she has a surgical
problem at this time, hospitalists were then called for admission
was some concern for possible SBP given the report of fevers and the
presence of ascites.

EXAM:
ULTRASOUND GUIDED PARACENTESIS
TECHNIQUE: The procedure, risks (including but not limited to bleeding,
infection, organ damage ), benefits, and alternatives were explained
to the patient. Questions regarding the procedure were encouraged
and answered. The patient understands and consents to the procedure.
Survey ultrasound of the abdomen was performed and an appropriate
skin entry site in the right lateral abdomen was selected. Skin site
was marked, prepped with chlorhexidine, and draped in usual sterile
fashion, and infiltrated locally with 1% lidocaine. A
Safe-T-Centesis sheath needle was advanced into the peritoneal space
until fluid could be aspirated. The sheath was advanced and the
needle removed. 5.78 L of clear yellowascites were aspirated. A
sample sent for the requested laboratory studies. The patient
tolerated the procedure well.
COMPLICATIONS:
COMPLICATIONS
none

[Series 1: us paracentesis · 0.29mm/px · 12 of 12 slices shown]
[im 1/12]
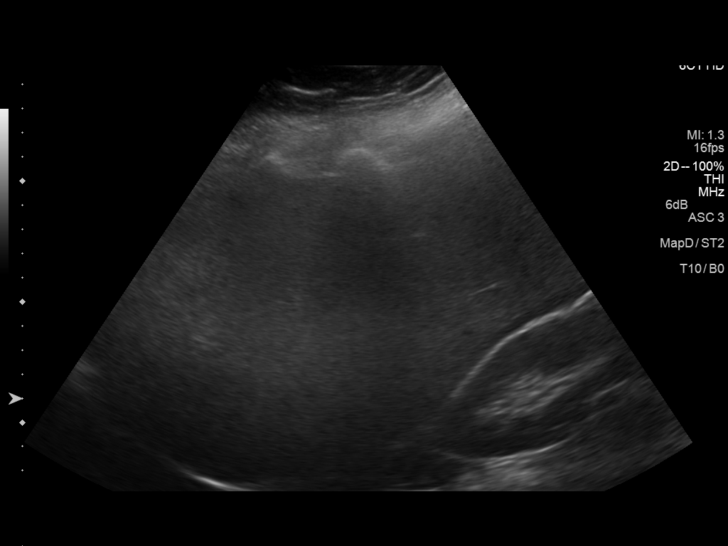
[im 2/12]
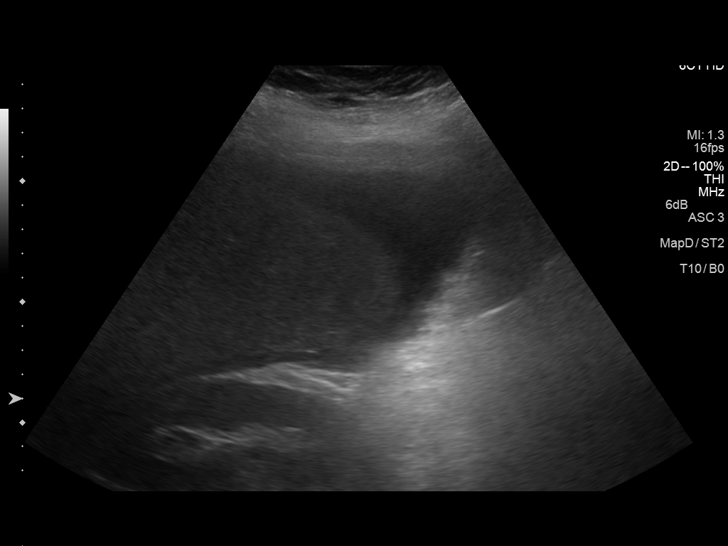
[im 3/12]
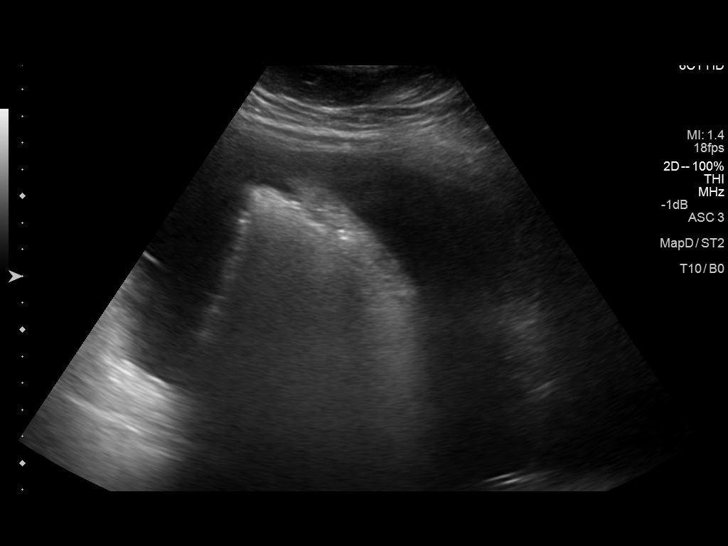
[im 4/12]
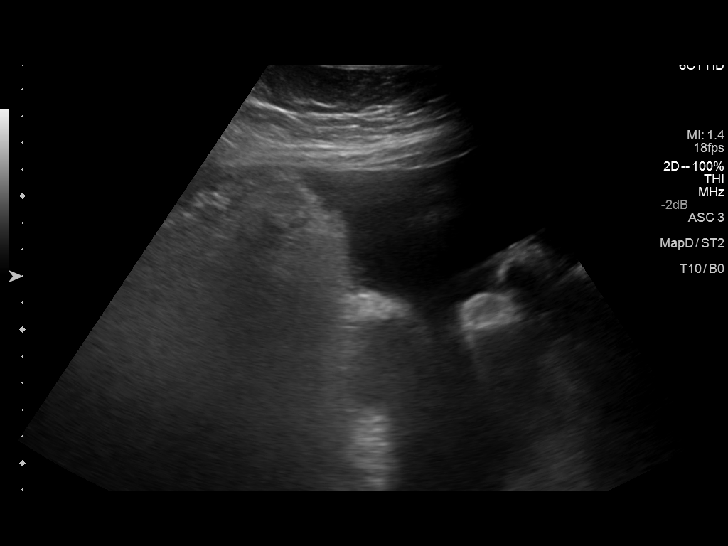
[im 5/12]
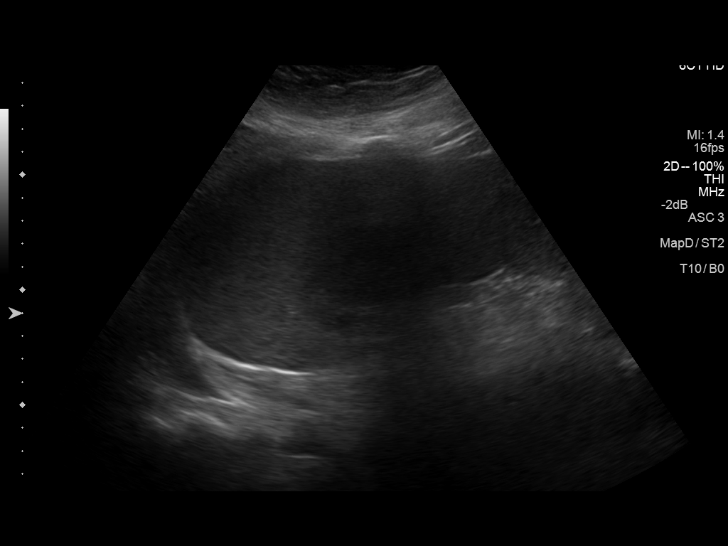
[im 6/12]
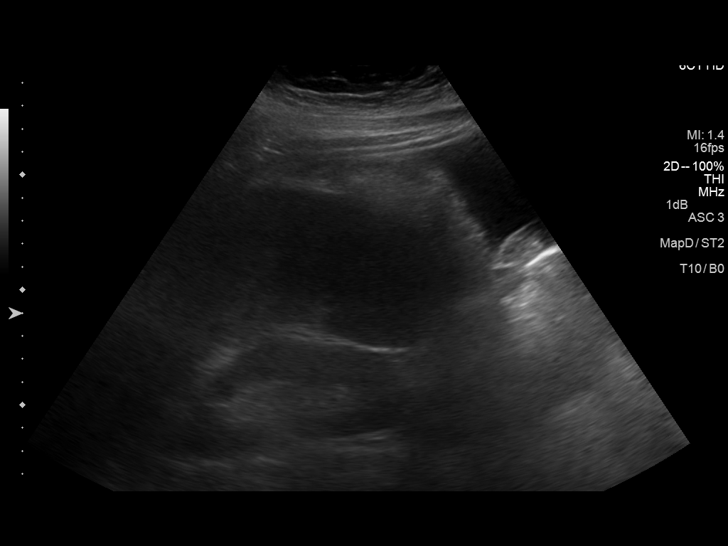
[im 7/12]
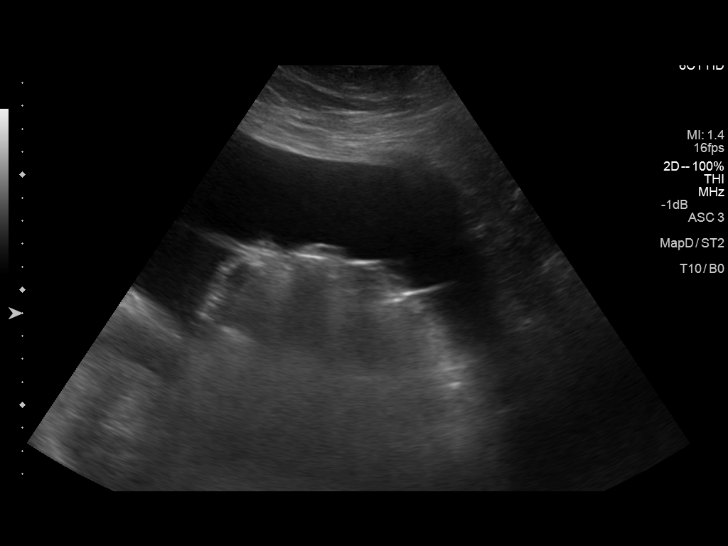
[im 8/12]
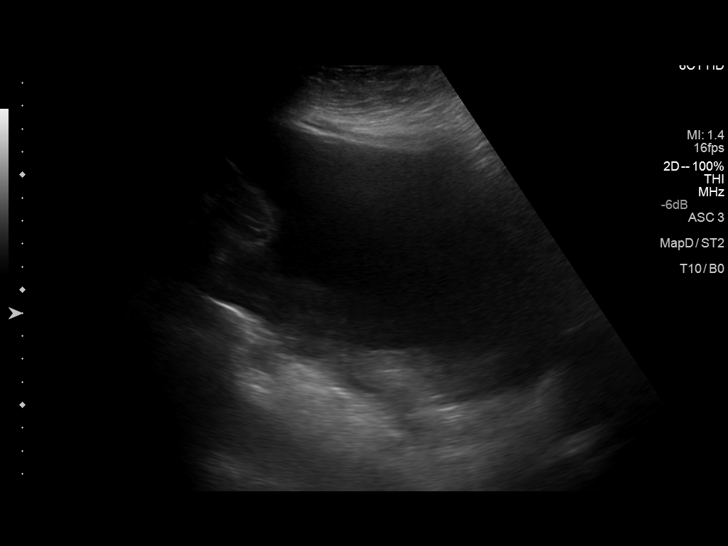
[im 9/12]
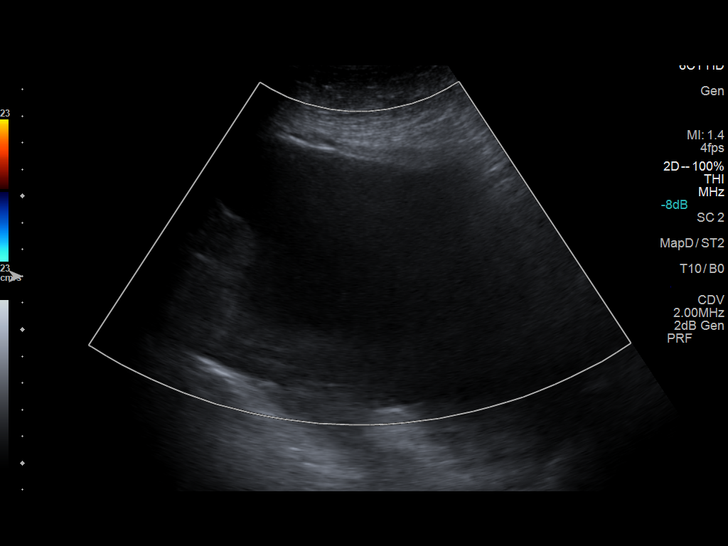
[im 10/12]
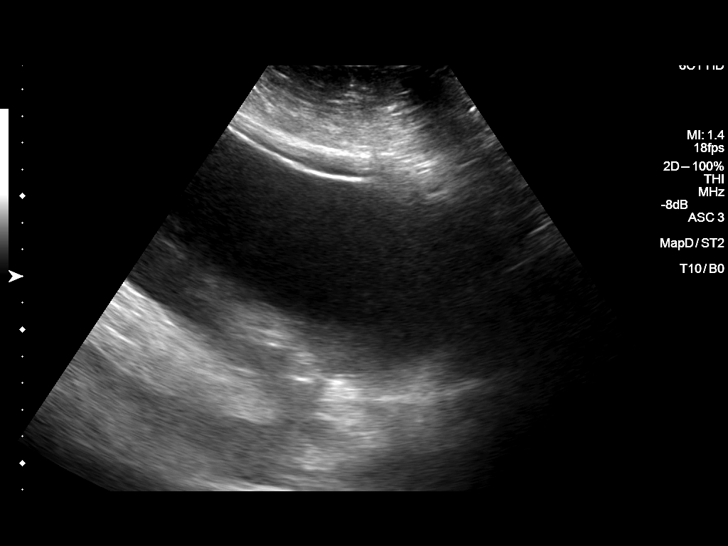
[im 11/12]
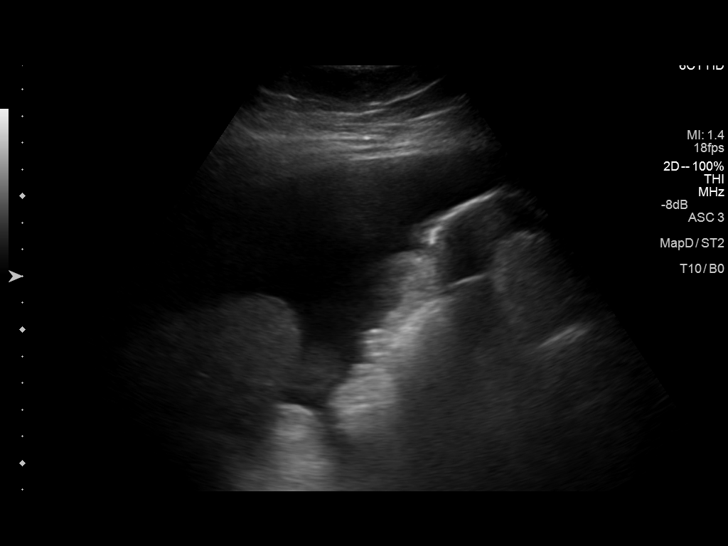
[im 12/12]
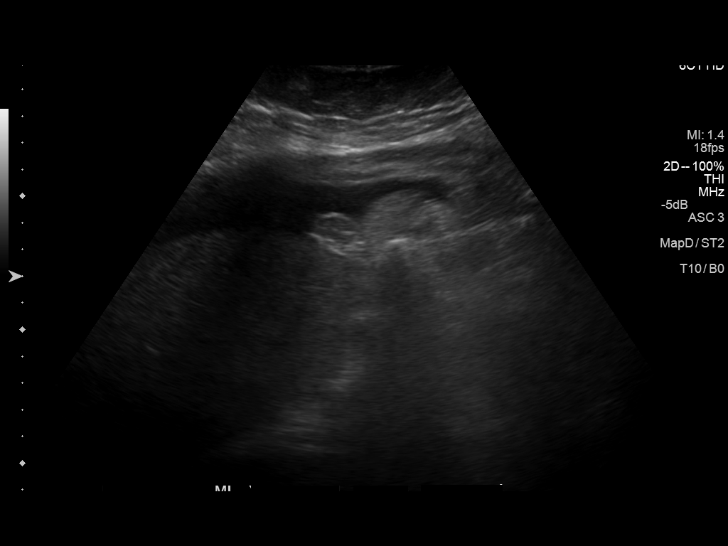

[12 of 12 positions shown; findings below may reference images not displayed]

IMPRESSION: Technically successful ultrasound guided paracentesis, removing
L of ascites.

## 2016-02-08 NOTE — Progress Notes (Signed)
Patient's Name: Lori Mckenzie  MRN: [id number]  Referring Provider: Ardine Eng, MD  DOB: 09-19-1965  PCP: Ardine Eng, MD  DOS: 02/09/2016  Note by: Kathlen Brunswick. Dossie Arbour, MD  Service setting: Ambulatory outpatient  Specialty: Interventional Pain Management  Location: [facility] (AMB) Pain Management Facility    Patient type: Established   Primary Reason(s) for Visit: Encounter for prescription drug management (Level of risk: moderate) CC: Neck Pain (left)  HPI  Lori Mckenzie is a 51 y.o. year old, female patient, who comes today for a medication management evaluation. She has Type 2 diabetes mellitus ([place]); COPD (chronic obstructive pulmonary disease) (HCC); HTN (hypertension); GERD (gastroesophageal reflux disease); OSA on CPAP; Anxiety; Steatohepatitis; Bile leak, postoperative; Dysthymia; Other social stressor; Abdominal abscess ([place]); Ascites; NASH (nonalcoholic steatohepatitis); Hypokalemia; Pyrexia; Opiate use (75 MME/Day); Long term prescription opiate use; Long term current use of opiate analgesic; Encounter for therapeutic drug level monitoring; Chronic epigastric abdominal pain; Chronic low back pain (Location of Primary Source of Pain) (Bilateral) (R>L); Chronic neck pain (Location of Tertiary source of pain) (Bilateral) (R>L); Breath shortness; Diastolic dysfunction; Clinical depression; Chronic obstructive pulmonary disease ([place]); Airway hyperreactivity; Anxiety state; Acid reflux; Essential (primary) hypertension; 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, L1-2, & L4-5); Lumbar and sacral osteoarthritis; Myofascial pain; Sleep apnea; Fall; Drowsiness; Episode of syncope; Subacute lumbar radiculopathy (left side) (S1 dermatome); Vitamin D deficiency; B12 deficiency; Folate deficiency; Thoracic radiculitis; Major depressive disorder with single episode; Elevated sedimentation rate; Elevated C-reactive protein (CRP); Lumbar facet syndrome (Location of Primary Source of Pain) (Bilateral) (R>L); Cervical  spondylosis; Chronic feet pain (Location of Secondary source of pain) (Bilateral) (R>L); Lumbar spondylosis; Encounter for chronic pain management; Chronic shoulder pain (Bilateral) (R>L); Chronic carpal tunnel syndrome (Bilateral); Chronic hip pain (Bilateral) (L>R); Chronic upper back pain (Bilateral) (L>R); Osteoporosis, idiopathic; Abnormal MRI, lumbar spine (02/03/2015); Hepatic encephalopathy ([place]); Radicular pain of thoracic region; Altered mental status; Cirrhosis of liver without ascites ([place]); Elevated liver enzymes; Opiate withdrawal ([place]); Chronic pain syndrome; Asthma; Depressive disorder; Esophageal reflux; Essential hypertension; Fever; Hypertension; Left upper quadrant pain; Lumbar spinal stenosis; Lumbosacral spondylosis without myelopathy; Major depressive disorder, single episode; Neuromyositis; Other chronic pain; Shock ([place]); Somnolence; Spinal stenosis of lumbar region without neurogenic claudication; Spondylosis of lumbar region without myelopathy or radiculopathy; Syncope; Acute cervical myofascial strain; and Allodynia on her problem list. Her primarily concern today is the Neck Pain (left)  Pain Assessment: Self-Reported Pain Score: 5 /10             Reported level is compatible with observation.       Pain Type: Chronic pain Pain Location: Neck Pain Orientation: Left Pain Descriptors / Indicators: Sharp, Shooting, Constant Pain Frequency: Constant  Lori Mckenzie was last seen on 01/22/2016 for medication management. During today's appointment we reviewed Lori Mckenzie's chronic pain status, as well as her outpatient medication regimen. The patient indicates today having some new pain over the left side of the neck and left shoulder. Today we will start her opioid taper to completely stop it. We will be going down by 5 mg per week until we completely stop it. I will also bring the patient back as soon as possible for a trigger point injection of the affected area. A shunt, I have also  ordered a compounded cream to apply to the area since she indicates having some hyper-algesia and allodynia in over the left shoulder.  The patient  reports that she does not use drugs. Her body mass index is 45.73 kg/m.  Further details on both, my assessment(s), as well as the proposed treatment plan, please see below.  Controlled Substance Pharmacotherapy Assessment REMS (Risk Evaluation and Mitigation Strategy)  Analgesic:Oxycodone/APAP 10/325 5 tablets per day (50 mg/day) MME/day:75 mg/day.  Zenovia Jarred, RN  02/09/2016  1:14 PM  Signed Nursing Pain Medication Assessment:  Safety precautions to be maintained throughout the outpatient stay will include: orient to surroundings, keep bed in low position, maintain call bell within reach at all times, provide assistance with transfer out of bed and ambulation.  Medication Inspection Compliance: Pill count conducted under aseptic conditions, in front of the patient. Neither the pills nor the bottle was removed from the patient's sight at any time. Once count was completed pills were immediately returned to the patient in their original bottle. Pill Count: 0 of 150 pills remain Bottle Appearance: Standard pharmacy container. Clearly labeled. Medication: Oxycodone IR Filled Date: 01 / 04 / 2018  Last dose taken 02-08-16   Pharmacokinetics: Liberation and absorption (onset of action): WNL Distribution (time to peak effect): WNL Metabolism and excretion (duration of action): WNL         Pharmacodynamics: Desired effects: Analgesia: Lori Mckenzie reports >50% benefit. Functional ability: Patient reports that medication allows her to accomplish basic ADLs Clinically meaningful improvement in function (CMIF): Sustained CMIF goals met Perceived effectiveness: Described as relatively effective, allowing for increase in activities of daily living (ADL) Undesirable effects: Side-effects or Adverse reactions: None reported Monitoring: [place] PMP: Online  review of the past 55-monthperiod conducted. Compliant with practice rules and regulations List of all UDS test(s) done:  Lab Results  Component Value Date   TOXASSSELUR FINAL 01/07/2016   [place] 09/28/2015   [place]AL 03/05/2015   TOXASSSELUR FINAL 02/05/2015   TOXASSSELUR FINAL 01/08/2015   Last UDS on record: Tox[employer]ure Select 13  Date Value Ref Range Status  01/07/2016 FINAL  Final    Comment:    ==================================================================== TOXASSURE SELECT 13 (MW) ==================================================================== Test                             Result       Flag       Units Drug Present and Declared for Prescription Verification   Alprazolam                     164          EXPECTED   ng/mg creat   Alpha-hydroxyalprazolam        410          EXPECTED   ng/mg creat    Source of alprazolam is a scheduled prescription medication.    Alpha-hydroxyalprazolam is an expected metabolite of alprazolam. Drug Absent but Declared for Prescription Verification   Oxycodone                      Not Detected UNEXPECTED ng/mg creat ==================================================================== Test                      Result    Flag   Units      Ref Range   Creatinine              140              mg/dL      >=20 ==================================================================== Declared Medications:  The flagging and interpretation on this report are based on the  following declared medications.  Unexpected results may arise from  inaccuracies in the declared medications.  **Note: The testing scope of this panel includes these medications:  Alprazolam  Oxycodone  **Note: The testing scope of this panel does not include following  reported medications:  Albuterol  Aspirin  Cholecalciferol  Citalopram (Celexa)  Cyanocobalamin  Dicyclomine  Fluticasone (Flonase)  Folic acid (Folate)  Furosemide  Gabapentin   Hydrochlorothiazide  Ipratropium  Lactulose  Naloxone (Narcan)  Nystatin  Ondansetron  Pantoprazole  Promethazine  Rifaximin  Rizatriptan  Spironolactone  Vitamin B12 ==================================================================== For clinical consultation, please call [phone]. ====================================================================    UDS interpretation: Non-Compliant Once again, there seems to be no opioids in her UDS. When we take this information in addition to that provided [place] by an anonymous call her, then it is of great concern that the patient remained fact be selling her narcotics. Because of this, today we will start header opioid taper with the goal of completely stopping it. Medication Assessment Form: Reviewed. Patient indicates being compliant with therapy Treatment compliance: Compliant Risk Assessment Profile: Aberrant behavior: See prior evaluations. None observed or detected today Comorbid factors increasing risk of overdose: See prior notes. No additional risks detected today Risk of substance use disorder (SUD): Low Opioid Risk Tool (ORT) Total Score:    Interpretation Table:  Score <3 = Low Risk for SUD  Score between 4-7 = Moderate Risk for SUD  Score >8 = High Risk for Opioid Abuse   Risk Mitigation Strategies:  Patient Counseling: Covered Patient-Prescriber Agreement (PPA): Present and active  Notification to other healthcare providers: Done  Pharmacologic Plan: No change in therapy, at this time  Laboratory Chemistry  Inflammation Markers Lab Results  Component Value Date   ESRSEDRATE 35 (H) 01/08/2015   CRP 2.8 (H) 01/08/2015   Renal Function Lab Results  Component Value Date   BUN 15 08/19/2015   CREATININE 1.09 (H) 08/19/2015   GFRAA >60 08/19/2015   GFRNONAA 57 (L) 08/19/2015   Hepatic Function Lab Results  Component Value Date   AST 65 (H) 08/19/2015   ALT 82 (H) 08/19/2015   ALBUMIN 4.8 08/19/2015    Electrolytes Lab Results  Component Value Date   NA 135 08/19/2015   K 4.3 08/19/2015   CL 99 (L) 08/19/2015   CALCIUM 9.4 08/19/2015   MG 1.9 01/08/2015   Pain Modulating Vitamins Lab Results  Component Value Date   VD25OH 14.3 (L) 01/08/2015   VD125OH2TOT 28.8 01/08/2015   VITAMINB12 172 (L) 01/08/2015   Coagulation Parameters Lab Results  Component Value Date   INR 1.18 12/01/2015   LABPROT 15.1 12/01/2015   APTT 24.6 08/26/2013   PLT 98 (L) 12/01/2015   Cardiovascular Lab Results  Component Value Date   BNP 11 12/11/2012   HGB 15.4 12/01/2015   HCT 43.5 12/01/2015   Note: Lab results reviewed.  Recent Diagnostic Imaging Review  No results found. Note: Imaging results reviewed.          Meds  The patient has a current medication list which includes the following prescription(s): albuterol, albuterol, alprazolam, aspirin ec, azithromycin, cholecalciferol, citalopram, dicyclomine, fluticasone, intrinsi b12-folate, furosemide, gabapentin, insulin detemir, ipratropium, lactulose, naloxone, NONFORMULARY OR COMPOUNDED ITEM, nystatin, ondansetron, oxycodone, oxycodone, oxycodone, oxycodone, oxycodone hcl, pantoprazole, promethazine, rifaximin, rizatriptan, spironolactone, and vitamin b-12.  Current Outpatient Prescriptions on File Prior to Visit  Medication Sig  . albuterol (ACCUNEB) 0.63 MG/3ML nebulizer solution Inhale 1 ampule by nebulization every six (6) hours as needed  for wheezing.  Marland Kitchen albuterol (PROVENTIL HFA;VENTOLIN HFA) 108 (90 BASE) MCG/ACT inhaler Inhale 2 puffs into the lungs every 4 (four) hours as needed for wheezing or shortness of breath.  . ALPRAZolam (XANAX) 1 MG tablet Take 1-2 mg by mouth 3 (three) times daily as needed for anxiety.  Marland Kitchen aspirin EC 81 MG tablet Take 81 mg by mouth daily.   . Cholecalciferol (VITAMIN D-1000 MAX ST) 1000 units tablet Take 2,000 Units by mouth.   . citalopram (CELEXA) 20 MG tablet Take 20 mg by mouth.  . dicyclomine  (BENTYL) 10 MG capsule Take 10 mg by mouth 4 (four) times daily -  before meals and at bedtime.   . fluticasone (FLONASE) 50 MCG/ACT nasal spray Place 1-2 sprays into both nostrils daily as needed for rhinitis.   Derald Macleod Factor (INTRINSI B12-FOLATE) 416-606-30 MCG-MCG-MG TABS Take 1 tablet by mouth daily. Reported on 04/06/2015  . furosemide (LASIX) 40 MG tablet Take 80 mg by mouth daily.   Marland Kitchen gabapentin (NEURONTIN) 300 MG capsule Take 900 mg by mouth 4 (four) times daily as needed (for pain).   . Insulin Detemir (LEVEMIR FLEXPEN [place]) Inject 40 Units into the skin.  Marland Kitchen ipratropium (ATROVENT) 0.02 % nebulizer solution Inhale 500 mcg by nebulization Four (4) times a day.  . lactulose (CHRONULAC) 10 GM/15ML solution Take 30 g by mouth 3 (three) times daily as needed.   . naloxone [facility]) 2 MG/2ML injection Inject into the vein as needed.  . nystatin (MYCOSTATIN) powder Apply 1 g topically 3 (three) times daily as needed (for irritation).   . ondansetron (ZOFRAN) 4 MG tablet Take by mouth as needed.   . pantoprazole (PROTONIX) 40 MG tablet Take by mouth.  . promethazine (PHENERGAN) 12.5 MG tablet Take 12.5 mg by mouth every 6 (six) hours as needed for nausea or vomiting.   . rifaximin (XIFAXAN) 550 MG TABS tablet Take 550 mg by mouth 2 (two) times daily.   . rizatriptan (MAXALT) 10 MG tablet Take 10 mg by mouth as needed for migraine.   Marland Kitchen spironolactone (ALDACTONE) 100 MG tablet Take 100 mg by mouth daily.   . vitamin B-12 (CYANOCOBALAMIN) 1000 MCG tablet Take 2,000 mcg by mouth daily.  . [DISCONTINUED] SUMAtriptan (IMITREX) 50 MG tablet Take 50 mg by mouth every 2 (two) hours as needed. For migraines   No current facility-administered medications on file prior to visit.    ROS  Constitutional: Denies any fever or chills Gastrointestinal: No reported hemesis, hematochezia, vomiting, or acute GI distress Musculoskeletal: Denies any acute onset joint swelling, redness, loss of ROM, or  weakness Neurological: No reported episodes of acute onset apraxia, aphasia, dysarthria, agnosia, amnesia, paralysis, loss of coordination, or loss of consciousness  Allergies  Lori Mckenzie is allergic to tape and vicodin [hydrocodone-acetaminophen].  PFSH  Drug: Lori Mckenzie  reports that she does not use drugs. Alcohol:  reports that she does not drink alcohol. Tobacco:  reports that she has never smoked. She has never used smokeless tobacco. Medical:  has a past medical history of Anxiety; Ascites; Asthma; Back pain; Brittle bone disease; Cancer ([place]); Cervical disc disease; Chronic kidney disease; Collagen vascular disease ([place]); COPD (chronic obstructive pulmonary disease) ([place]); Diabetes mellitus without complication ([place]); GERD (gastroesophageal reflux disease); Hypertension; Hypothyroidism; Migraines; NASH (nonalcoholic steatohepatitis); Respiratory infection; Sleep apnea; Sleep apnea; Thyroid disease; and TIA (transient ischemic attack). Family: family history includes Heart disease in her brother and sister; Lung cancer in her father; Ulcers in her father and  sister.  Past Surgical History:  Procedure Laterality Date  . ABDOMINAL HYSTERECTOMY    . CHOLECYSTECTOMY N/A 07/15/2014   Procedure: LAPAROSCOPIC CHOLECYSTECTOMY with liver biopsy ;  Surgeon: Sherri Rad, MD;  Location: [facility] ORS;  Service: General;  Laterality: N/A;  . COLONOSCOPY WITH PROPOFOL N/A 06/23/2014   Procedure: COLONOSCOPY WITH PROPOFOL;  Surgeon: Lollie Sails, MD;  Location: [facility] ENDOSCOPY;  Service: Endoscopy;  Laterality: N/A;  . ERCP N/A 07/16/2014   Procedure: ENDOSCOPIC RETROGRADE CHOLANGIOPANCREATOGRAPHY (ERCP);  Surgeon: Clarene Essex, MD;  Location: [facility] ENDOSCOPY;  Service: Endoscopy;  Laterality: N/A;  . ERCP N/A 10/03/2014   Procedure: ENDOSCOPIC RETROGRADE CHOLANGIOPANCREATOGRAPHY (ERCP);  Surgeon: Hulen Luster, MD;  Location: [facility] ENDOSCOPY;  Service: Gastroenterology;  Laterality: N/A;  . ESOPHAGOGASTRODUODENOSCOPY  N/A 06/23/2014   Procedure: ESOPHAGOGASTRODUODENOSCOPY (EGD);  Surgeon: Lollie Sails, MD;  Location[facility] ENDOSCOPY;  Service: Endoscopy;  Laterality: N/A;  . ESOPHAGOGASTRODUODENOSCOPY (EGD) WITH PROPOFOL N/A 12/01/2015   Procedure: ESOPHAGOGASTRODUODENOSCOPY (EGD) WITH PROPOFOL;  Surgeon: Lollie Sails, MD;  Location: [facility] ENDOSCOPY;  Service: Endoscopy;  Laterality: N/A;  . TUBAL LIGATION    . WISDOM TOOTH EXTRACTION     Constitutional Exam  General appearance: Well nourished, well developed, and well hydrated. In no apparent acute distress Vitals:   02/09/16 1306 02/09/16 1308  BP:  137/85  Pulse: (!) 111   Resp: 18   Temp: 98.2 F (36.8 C)   SpO2: 99%   Weight: 250 lb (113.4 kg)   Height: 5' 2"  (1.575 m)    BMI Assessment: Estimated body mass index is 45.73 kg/m as calculated from the following:   Height as of this encounter: 5' 2"  (1.575 m).   Weight as of this encounter: 250 lb (113.4 kg).  BMI interpretation table: BMI level Category Range association with higher incidence of chronic pain  <18 kg/m2 Underweight   18.5-24.9 kg/m2 Ideal body weight   25-29.9 kg/m2 Overweight Increased incidence by 20%  30-34.9 kg/m2 Obese (Class I) Increased incidence by 68%  35-39.9 kg/m2 Severe obesity (Class II) Increased incidence by 136%  >40 kg/m2 Extreme obesity (Class III) Increased incidence by 254%   BMI Readings from Last 4 Encounters:  02/09/16 45.73 kg/m  01/07/16 45.73 kg/m  12/01/15 45.73 kg/m  09/28/15 45.73 kg/m   Wt Readings from Last 4 Encounters:  02/09/16 250 lb (113.4 kg)  01/07/16 250 lb (113.4 kg)  12/01/15 250 lb (113.4 kg)  09/28/15 250 lb (113.4 kg)  Psych/Mental status: Alert, oriented x 3 (person, place, & time)       Eyes: PERLA Respiratory: No evidence of acute respiratory distress  Cervical Spine Exam  Inspection: No masses, redness, or swelling Alignment: Symmetrical Functional ROM: Decreased ROM Stability: No instability  detected Muscle strength & Tone: Functionally intact Sensory: Movement-associated discomfort Palpation: Positive for hyperalgesia and allodynia over the left shoulder area  Upper Extremity (UE) Exam    Side: Right upper extremity  Side: Left upper extremity  Inspection: No masses, redness, swelling, or asymmetry. No contractures  Inspection: No masses, redness, swelling, or asymmetry. No contractures  Functional ROM: Unrestricted ROM          Functional ROM: Unrestricted ROM          Muscle strength & Tone: Functionally intact  Muscle strength & Tone: Functionally intact  Sensory: Unimpaired  Sensory: Unimpaired  Palpation: Euthermic  Palpation: Euthermic  Specialized Test(s): Deferred         Specialized Test(s): Deferred  Thoracic Spine Exam  Inspection: No masses, redness, or swelling Alignment: Symmetrical Functional ROM: Unrestricted ROM Stability: No instability detected Sensory: Unimpaired Muscle strength & Tone: Functionally intact Palpation: Non-contributory  Lumbar Spine Exam  Inspection: No masses, redness, or swelling Alignment: Symmetrical Functional ROM: Unrestricted ROM Stability: No instability detected Muscle strength & Tone: Functionally intact Sensory: Unimpaired Palpation: Non-contributory Provocative Tests: Lumbar Hyperextension and rotation test: evaluation deferred today       Patrick's Maneuver: evaluation deferred today              Gait & Posture Assessment  Ambulation: Unassisted Gait: Relatively normal for age and body habitus Posture: WNL   Lower Extremity Exam    Side: Right lower extremity  Side: Left lower extremity  Inspection: No masses, redness, swelling, or asymmetry. No contractures  Inspection: No masses, redness, swelling, or asymmetry. No contractures  Functional ROM: Unrestricted ROM          Functional ROM: Unrestricted ROM          Muscle strength & Tone: Functionally intact  Muscle strength & Tone: Functionally intact   Sensory: Unimpaired  Sensory: Unimpaired  Palpation: No palpable anomalies  Palpation: No palpable anomalies   Assessment  Primary Diagnosis & Pertinent Problem List: The primary encounter diagnosis was Chronic pain syndrome. Diagnoses of Chronic low back pain (Location of Primary Source of Pain) (Bilateral) (R>L), Chronic foot pain, unspecified laterality, Chronic neck pain (Location of Tertiary source of pain) (Bilateral) (R>L), Lumbar facet syndrome (Location of Primary Source of Pain) (Bilateral) (R>L), Long term current use of opiate analgesic, Opiate use (75 MME/Day), Myofascial pain, Acute cervical myofascial strain, initial encounter, and Allodynia were also pertinent to this visit.  Status Diagnosis  Controlled Controlled Controlled 1. Chronic pain syndrome   2. Chronic low back pain (Location of Primary Source of Pain) (Bilateral) (R>L)   3. Chronic foot pain, unspecified laterality   4. Chronic neck pain (Location of Tertiary source of pain) (Bilateral) (R>L)   5. Lumbar facet syndrome (Location of Primary Source of Pain) (Bilateral) (R>L)   6. Long term current use of opiate analgesic   7. Opiate use (75 MME/Day)   8. Myofascial pain   9. Acute cervical myofascial strain, initial encounter   10. Allodynia      Plan of Care  Pharmacotherapy (Medications Ordered): Meds ordered this encounter  Medications  . oxyCODONE (OXY IR/ROXICODONE) 5 MG immediate release tablet    Sig: Take 1 tablet (5 mg total) by mouth 4 (four) times daily. Max: 4/day    Dispense:  28 tablet    Refill:  0    This is an opioid taper. Provide patient with medication in the exact order and day as requested. Patient may have prescription filled one day early if pharmacy is closed on scheduled refill date. Do not fill until: 02/09/16 To last until: 02/16/16  . oxyCODONE (OXY IR/ROXICODONE) 5 MG immediate release tablet    Sig: Take 1 tablet (5 mg total) by mouth 3 (three) times daily. Max: 3/day     Dispense:  21 tablet    Refill:  0    This is an opioid taper. Provide patient with medication in the exact order and day as requested. Patient may have prescription filled one day early if pharmacy is closed on scheduled refill date. Do not fill until: 02/16/16 To last until: 02/23/16  . oxyCODONE (OXY IR/ROXICODONE) 5 MG immediate release tablet    Sig: Take 1 tablet (5 mg total)  by mouth 2 (two) times daily. Max: 2/day    Dispense:  14 tablet    Refill:  0    This is an opioid taper. Provide patient with medication in the exact order and day as requested. Patient may have prescription filled one day early if pharmacy is closed on scheduled refill date. Do not fill until: 02/23/16 To last until: 03/01/16  . oxyCODONE (OXY IR/ROXICODONE) 5 MG immediate release tablet    Sig: Take 1 tablet (5 mg total) by mouth daily. Max: 1/day    Dispense:  7 tablet    Refill:  0    This is an opioid taper. Provide patient with medication in the exact order and day as requested. Patient may have prescription filled one day early if pharmacy is closed on scheduled refill date. Do not fill until: 03/01/16 To last until: 03/08/16  . Oxycodone HCl 10 MG TABS    Sig: Take 1 tablet (10 mg total) by mouth 3 (three) times daily.    Dispense:  84 tablet    Refill:  0    Do not add this medication to the electronic "Automatic Refill" notification system. Patient may have prescription filled one day early if pharmacy is closed on scheduled refill date. Do not fill until: 02/09/16 To last until: 03/08/16  . NONFORMULARY OR COMPOUNDED ITEM    Sig: 10% Ketamine/2% Cyclobenzaprine/6% Gabapentin Cream Sig: 1-2 ml to affected area 3-4 times/day. Dispense: 240 GM bottle    Dispense:  1 each    Refill:  2    Do not place this medication, or any other prescription from our practice, on "Automatic Refill". Patient may have prescription filled one day early if pharmacy is closed on scheduled refill date.   New  Prescriptions   NONFORMULARY OR COMPOUNDED ITEM    10% Ketamine/2% Cyclobenzaprine/6% Gabapentin Cream Sig: 1-2 ml to affected area 3-4 times/day. Dispense: 240 GM bottle   OXYCODONE (OXY IR/ROXICODONE) 5 MG IMMEDIATE RELEASE TABLET    Take 1 tablet (5 mg total) by mouth 4 (four) times daily. Max: 4/day   OXYCODONE (OXY IR/ROXICODONE) 5 MG IMMEDIATE RELEASE TABLET    Take 1 tablet (5 mg total) by mouth 3 (three) times daily. Max: 3/day   OXYCODONE (OXY IR/ROXICODONE) 5 MG IMMEDIATE RELEASE TABLET    Take 1 tablet (5 mg total) by mouth 2 (two) times daily. Max: 2/day   OXYCODONE (OXY IR/ROXICODONE) 5 MG IMMEDIATE RELEASE TABLET    Take 1 tablet (5 mg total) by mouth daily. Max: 1/day   Medications administered today: Lori Mckenzie had no medications administered during this visit. Lab-work, procedure(s), and/or referral(s): Orders Placed This Encounter  Procedures  . 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    Considering:   Diagnostic bilateral lumbar facet block Possible bilateral lumbar facet radiofrequency ablation Diagnostic bilateral cervical facet block Diagnostic L1-2 lumbar epidural steroid injection Diagnostic, right-sided L4-5 lumbar epidural steroid injection Diagnostic bilateral intra-articular shoulder joint injection Diagnostic bilateral suprascapular nerve block Possible bilateral suprascapular nerve radiofrequency ablation Palliative left-sided L5-S1 lumbar epidural steroid injection  Diagnostic left S1 selective nerve root block Diagnostic right-sided cervical epidural steroid injection Diagnostic T10-11 thoracic epidural steroid injection Diagnostic bilateral intra-articular hip joint injection Possible bilateral hip radiofrequency ablation   Palliative PRN treatment(s):    Diagnostic bilateral lumbar facet block Diagnostic bilateral cervical facet  block Diagnostic L1-2 lumbar epidural steroid injection Diagnostic, right-sided L4-5 lumbar epidural steroid injection Diagnostic bilateral intra-articular shoulder joint injection Diagnostic bilateral suprascapular nerve block Palliative left-sided L5-S1 lumbar epidural steroid injection  Diagnostic left S1 selective nerve root block Diagnostic right-sided cervical epidural steroid injection Diagnostic T10-11 thoracic epidural steroid injection  Diagnostic bilateral intra-articular hip joint injection   Provider-requested follow-up: Return in about 4 weeks (around 03/08/2016) for (MD) Med-Mgmt, procedure (ASAP).  Future Appointments Date Time Provider [place]  02/15/2016 8:45 AM Milinda Pointer, MD [facility] None   Primary Care Physician: Ardine Eng, MD Location: [facility] Note by: Kathlen Brunswick. Dossie Arbour, M.D, DABA, DABAPM, DABPM, DABIPP, FIPP Date: 02/09/2016; Time: 2:41 PM  Pain Score Disclaimer: We use the NRS-11 scale. This is a self-reported, subjective measurement of pain severity with only modest accuracy. It is used primarily to identify changes within a particular patient. It must be understood that outpatient pain scales are significantly less accurate that those used for research, where they can be applied under ideal controlled circumstances with minimal exposure to variables. In reality, the score is likely to be a combination of pain intensity and pain affect, where pain affect describes the degree of emotional arousal or changes in action readiness caused by the sensory experience of pain. Factors such as social and work situation, setting, emotional state, anxiety levels, expectation, and prior pain experience may influence pain perception and show large inter-individual differences that may also be affected by time variables.  Patient instructions provided  during this appointment: Patient Instructions  You were given 5 prescriptions for Oxycodone today. You were given a prescription for a cream for your shoulder pain.

## 2016-02-09 ENCOUNTER — Ambulatory Visit: Payer: Medicaid Other | Attending: Pain Medicine | Admitting: Pain Medicine

## 2016-02-09 ENCOUNTER — Encounter: Payer: Self-pay | Admitting: Pain Medicine

## 2016-02-09 VITALS — BP 137/85 | HR 111 | Temp 98.2°F | Resp 18 | Ht 62.0 in | Wt 250.0 lb

## 2016-02-09 DIAGNOSIS — G894 Chronic pain syndrome: Secondary | ICD-10-CM

## 2016-02-09 DIAGNOSIS — M5441 Lumbago with sciatica, right side: Secondary | ICD-10-CM

## 2016-02-09 DIAGNOSIS — M79673 Pain in unspecified foot: Secondary | ICD-10-CM | POA: Diagnosis not present

## 2016-02-09 DIAGNOSIS — G8929 Other chronic pain: Secondary | ICD-10-CM

## 2016-02-09 DIAGNOSIS — M791 Myalgia: Secondary | ICD-10-CM | POA: Diagnosis not present

## 2016-02-09 DIAGNOSIS — S161XXA Strain of muscle, fascia and tendon at neck level, initial encounter: Secondary | ICD-10-CM

## 2016-02-09 DIAGNOSIS — Z79899 Other long term (current) drug therapy: Secondary | ICD-10-CM | POA: Insufficient documentation

## 2016-02-09 DIAGNOSIS — Z7982 Long term (current) use of aspirin: Secondary | ICD-10-CM | POA: Diagnosis not present

## 2016-02-09 DIAGNOSIS — X58XXXA Exposure to other specified factors, initial encounter: Secondary | ICD-10-CM | POA: Diagnosis not present

## 2016-02-09 DIAGNOSIS — Z9889 Other specified postprocedural states: Secondary | ICD-10-CM | POA: Diagnosis not present

## 2016-02-09 DIAGNOSIS — Z9049 Acquired absence of other specified parts of digestive tract: Secondary | ICD-10-CM | POA: Insufficient documentation

## 2016-02-09 DIAGNOSIS — Z794 Long term (current) use of insulin: Secondary | ICD-10-CM | POA: Diagnosis not present

## 2016-02-09 DIAGNOSIS — M47816 Spondylosis without myelopathy or radiculopathy, lumbar region: Secondary | ICD-10-CM

## 2016-02-09 DIAGNOSIS — Z5181 Encounter for therapeutic drug level monitoring: Secondary | ICD-10-CM | POA: Insufficient documentation

## 2016-02-09 DIAGNOSIS — Z79891 Long term (current) use of opiate analgesic: Secondary | ICD-10-CM | POA: Diagnosis not present

## 2016-02-09 DIAGNOSIS — R1012 Left upper quadrant pain: Secondary | ICD-10-CM | POA: Insufficient documentation

## 2016-02-09 DIAGNOSIS — Z90710 Acquired absence of both cervix and uterus: Secondary | ICD-10-CM | POA: Insufficient documentation

## 2016-02-09 DIAGNOSIS — M542 Cervicalgia: Secondary | ICD-10-CM

## 2016-02-09 DIAGNOSIS — M5442 Lumbago with sciatica, left side: Secondary | ICD-10-CM

## 2016-02-09 DIAGNOSIS — M1288 Other specific arthropathies, not elsewhere classified, other specified site: Secondary | ICD-10-CM

## 2016-02-09 DIAGNOSIS — F1190 Opioid use, unspecified, uncomplicated: Secondary | ICD-10-CM

## 2016-02-09 DIAGNOSIS — M7918 Myalgia, other site: Secondary | ICD-10-CM

## 2016-02-09 DIAGNOSIS — R208 Other disturbances of skin sensation: Secondary | ICD-10-CM

## 2016-02-09 HISTORY — DX: Strain of muscle, fascia and tendon at neck level, initial encounter: S16.1XXA

## 2016-02-09 MED ORDER — OXYCODONE HCL 5 MG PO TABS: 5.0000 mg | ORAL_TABLET | Freq: Three times a day (TID) | ORAL | 0 refills | Status: DC

## 2016-02-09 MED ORDER — OXYCODONE HCL 5 MG PO TABS: 5.0000 mg | ORAL_TABLET | Freq: Every day | ORAL | 0 refills | Status: DC

## 2016-02-09 MED ORDER — NONFORMULARY OR COMPOUNDED ITEM: 2 refills | Status: DC

## 2016-02-09 MED ORDER — OXYCODONE HCL 5 MG PO TABS: 5.0000 mg | ORAL_TABLET | Freq: Two times a day (BID) | ORAL | 0 refills | Status: DC

## 2016-02-09 MED ORDER — OXYCODONE HCL 10 MG PO TABS: 10.0000 mg | ORAL_TABLET | Freq: Three times a day (TID) | ORAL | 0 refills | Status: DC

## 2016-02-09 MED ORDER — OXYCODONE HCL 5 MG PO TABS: 5.0000 mg | ORAL_TABLET | Freq: Four times a day (QID) | ORAL | 0 refills | Status: DC

## 2016-02-09 NOTE — Progress Notes (Signed)
Nursing Pain Medication Assessment:  Safety precautions to be maintained throughout the outpatient stay will include: orient to surroundings, keep bed in low position, maintain call bell within reach at all times, provide assistance with transfer out of bed and ambulation.  Medication Inspection Compliance: Pill count conducted under aseptic conditions, in front of the patient. Neither the pills nor the bottle was removed from the patient's sight at any time. Once count was completed pills were immediately returned to the patient in their original bottle. Pill Count: 0 of 150 pills remain Bottle Appearance: Standard pharmacy container. Clearly labeled. Medication: Oxycodone IR Filled Date: 01 / 04 / 2018  Last dose taken 02-08-16

## 2016-02-09 NOTE — Patient Instructions (Addendum)
You were given 5 prescriptions for Oxycodone today. You were given a prescription for a cream for your shoulder pain.

## 2016-02-11 ENCOUNTER — Other Ambulatory Visit: Payer: Self-pay | Admitting: Family Medicine

## 2016-02-11 DIAGNOSIS — M542 Cervicalgia: Secondary | ICD-10-CM

## 2016-02-15 ENCOUNTER — Ambulatory Visit: Payer: Medicaid Other | Admitting: Pain Medicine

## 2016-02-16 IMAGING — CR DG CHEST 1V PORT
1 series · 1 of 1 positions shown · non-contrast
Comparison: 08/27/2014

CLINICAL DATA: Chest pain beginning yesterday. Shortness of breath.
Nausea. Asthma.

EXAM:
PORTABLE CHEST 1 VIEW

[ap]
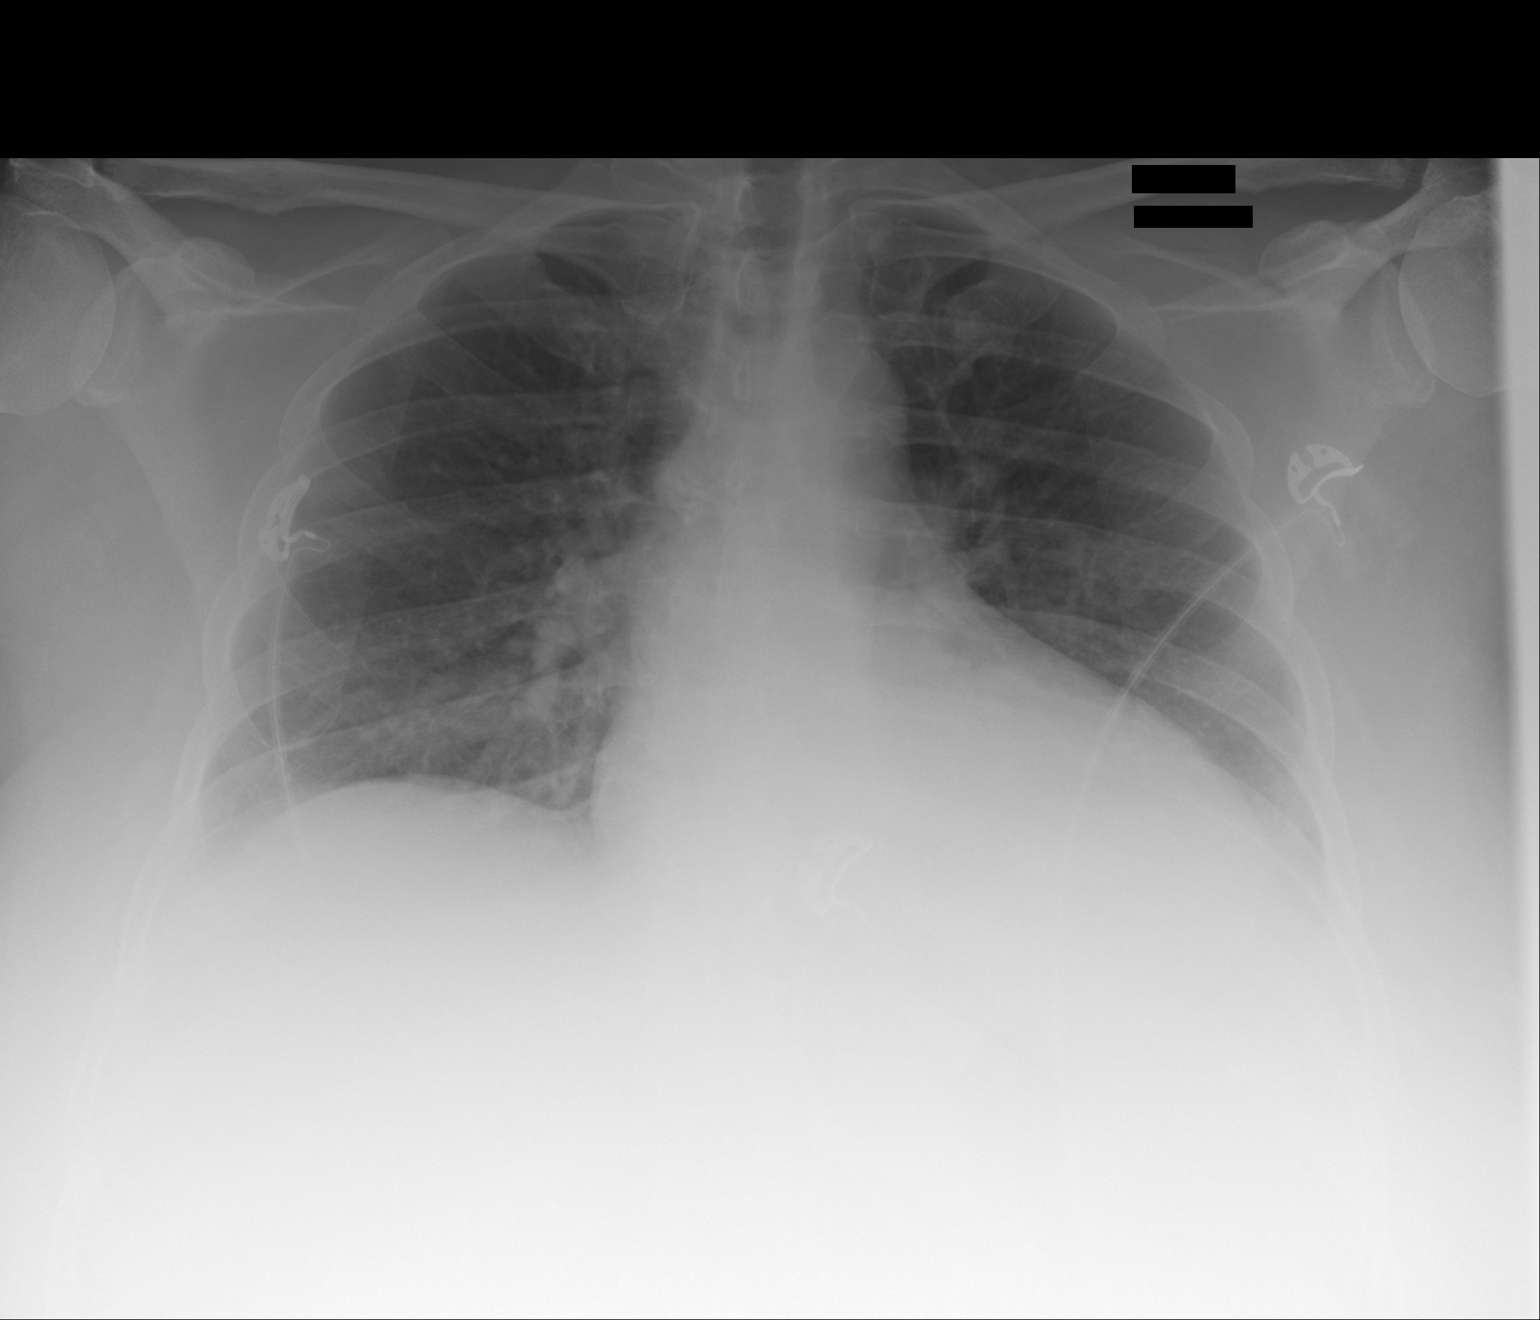

[1 of 1 positions shown; findings below may reference images not displayed]

FINDINGS: Low lung volumes are seen. Opacity is seen in the left retrocardiac
lung base with silhouetting of left hemidiaphragm, which may be due
to atelectasis or consolidation. Right lung remains clear. Heart
size within normal limits.
IMPRESSION: Low lung volumes with left retrocardiac atelectasis versus
consolidation.

## 2016-02-17 ENCOUNTER — Ambulatory Visit: Payer: Medicaid Other | Admitting: Pain Medicine

## 2016-02-17 IMAGING — CT CT ABD-PELV W/ CM
1 of 3 series · 13 of 32 positions shown, 18 images · IV contrast (omnipaque)
Comparison: CT 09/04/2014

CLINICAL DATA: Postoperative fever recent cholecystectomy with
effusions. Biliary drain was necessitated.

EXAM:
CT ABDOMEN AND PELVIS WITH CONTRAST
TECHNIQUE: Multidetector CT imaging of the abdomen and pelvis was performed
using the standard protocol following bolus administration of
intravenous contrast.
CONTRAST:  100mL OMNIPAQUE IOHEXOL 300 MG/ML  SOLN

[Series 2: routine abd pel with · axial · 0.98mm/px · z∈[-1188,-694]mm · 13 of 111 slices shown, 18 images]
[im 6/111  soft-tissue]
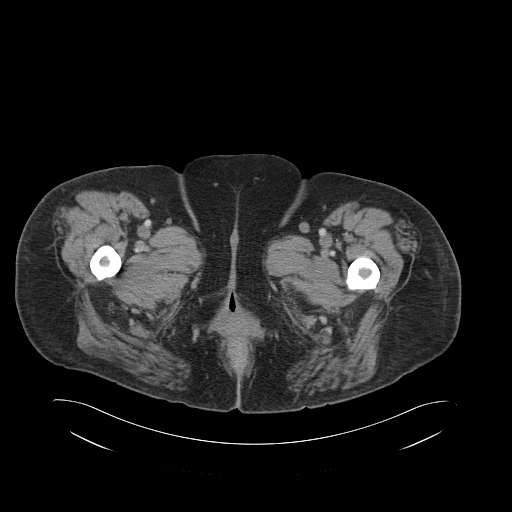
[im 6/111  bone]
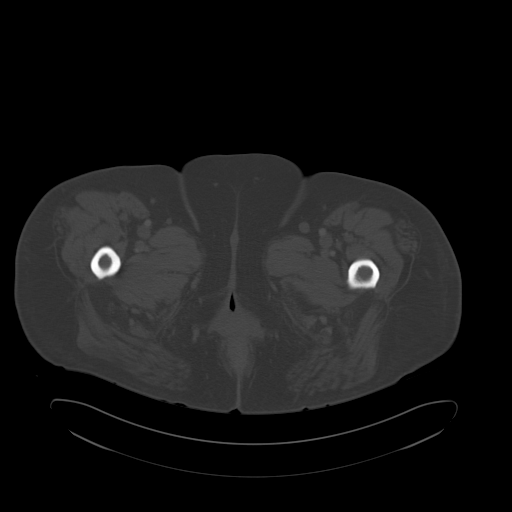
[im 18/111  soft-tissue]
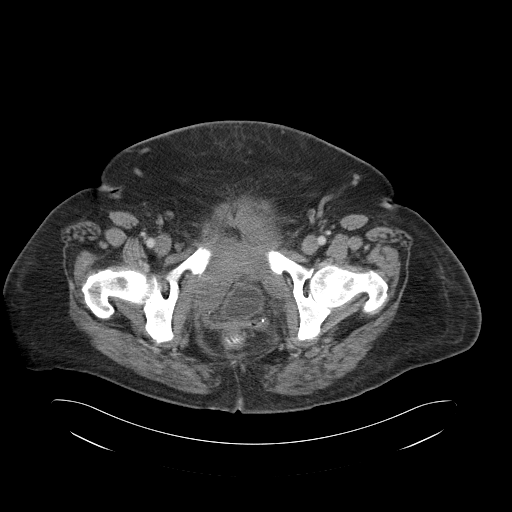
[im 24/111  soft-tissue]
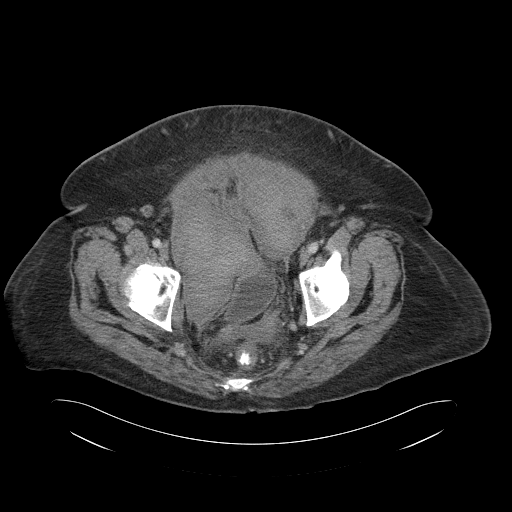
[im 35/111  soft-tissue]
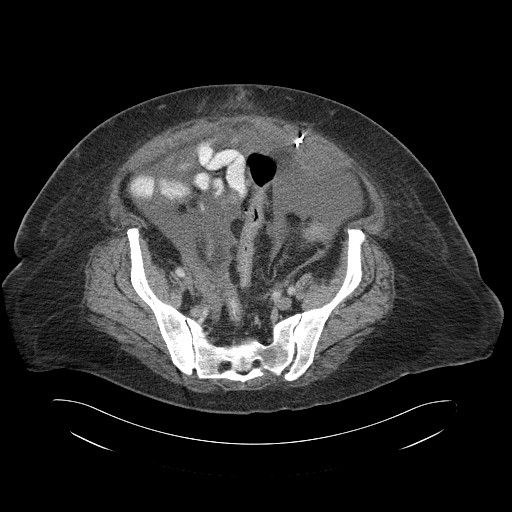
[im 41/111  soft-tissue]
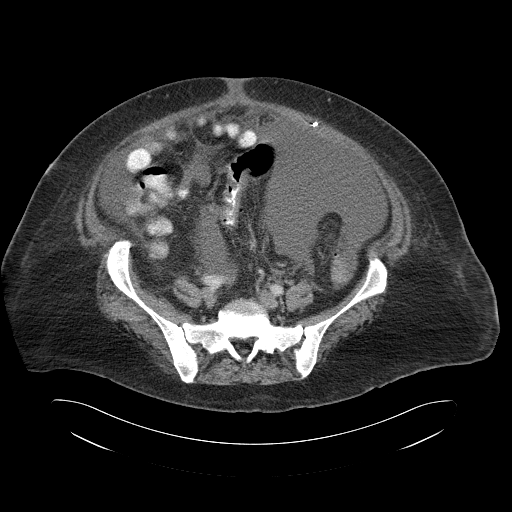
[im 53/111  soft-tissue]
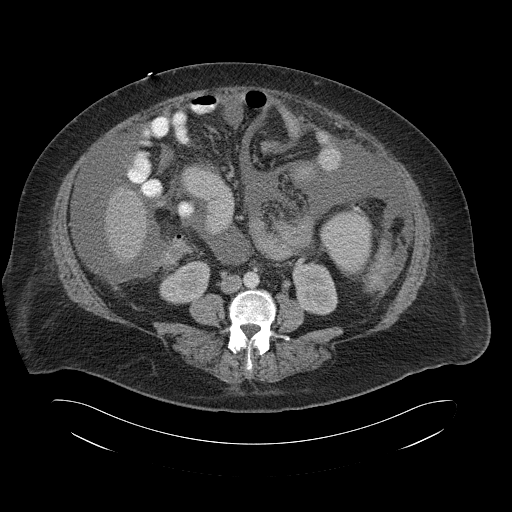
[im 58/111  soft-tissue]
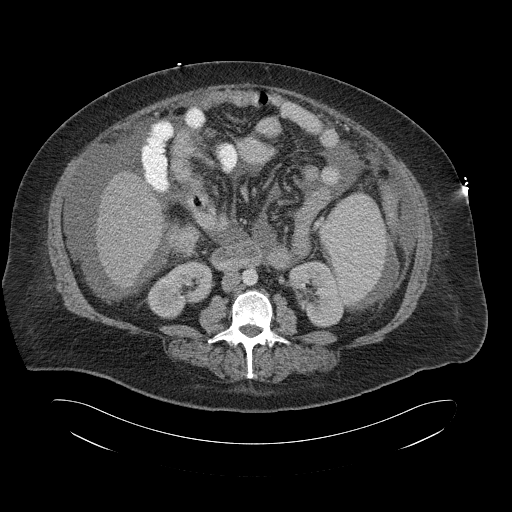
[im 70/111  soft-tissue]
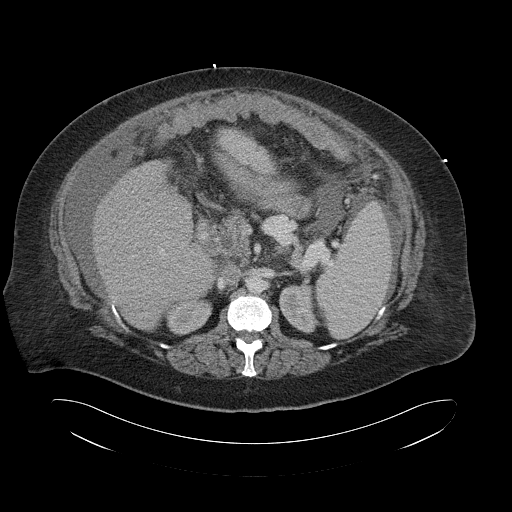
[im 76/111  soft-tissue]
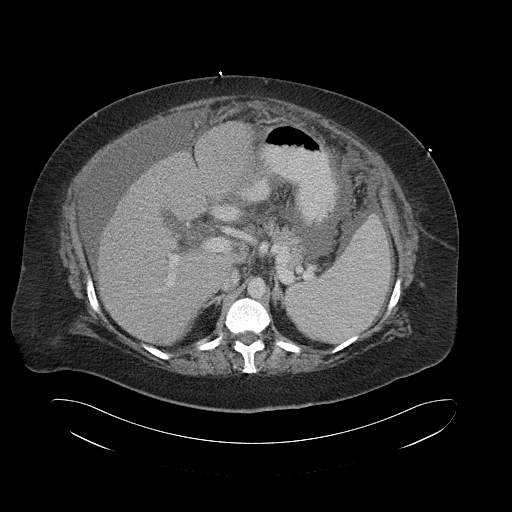
[im 76/111  bone]
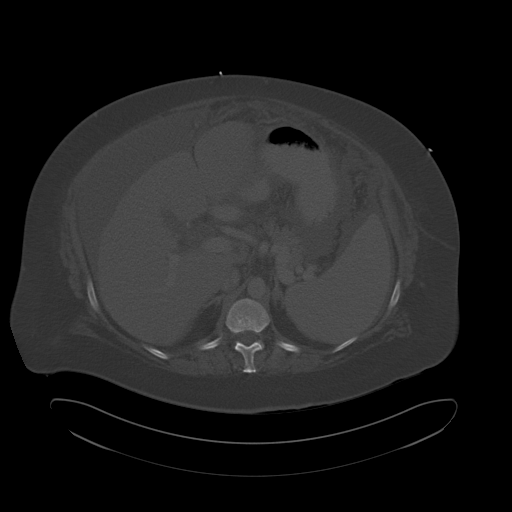
[im 87/111  soft-tissue]
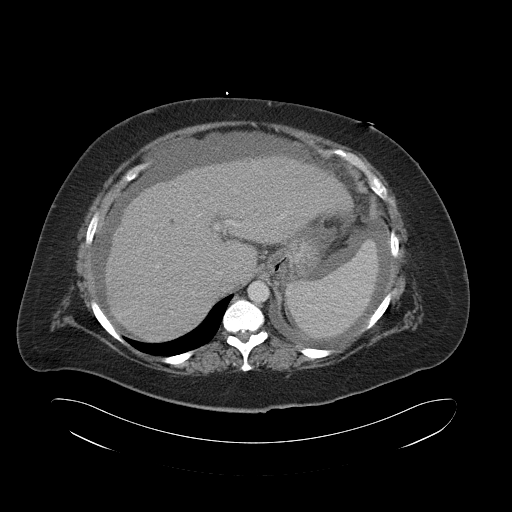
[im 87/111  lung]
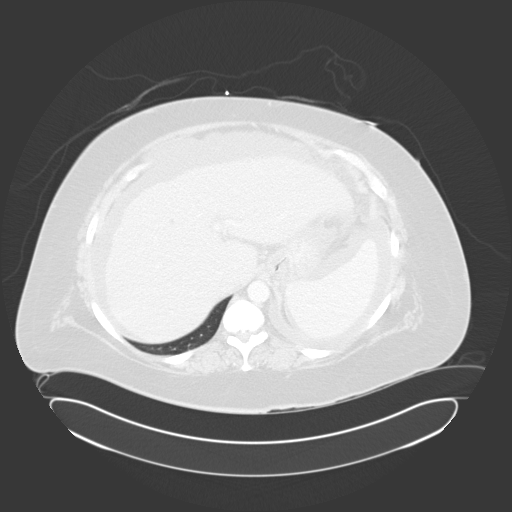
[im 93/111  soft-tissue]
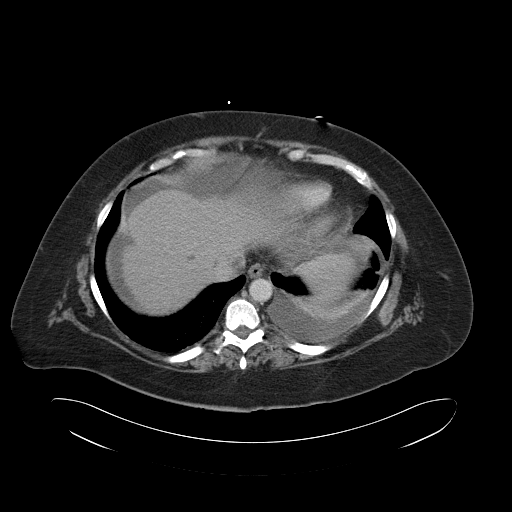
[im 93/111  lung]
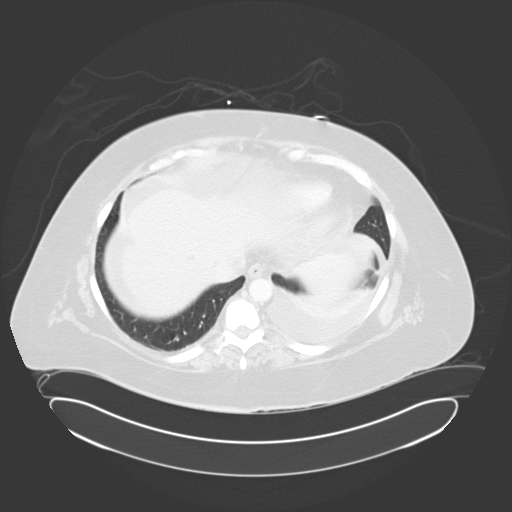
[im 99/111  lung]
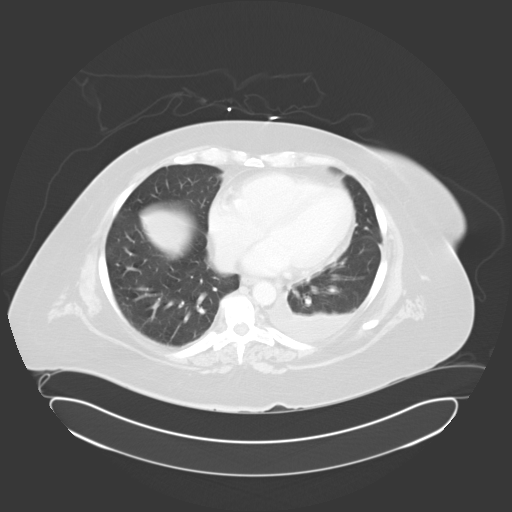
[im 105/111  soft-tissue]
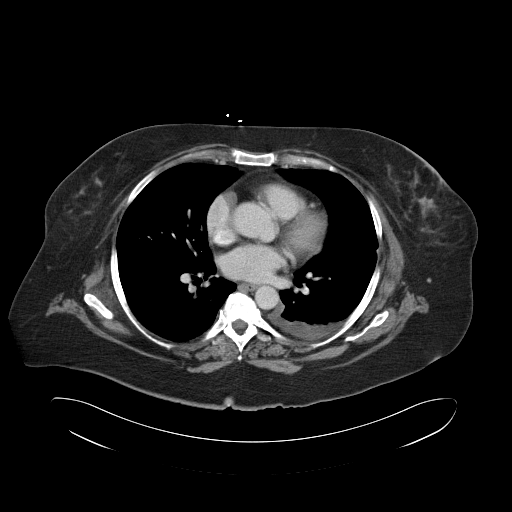
[im 105/111  lung]
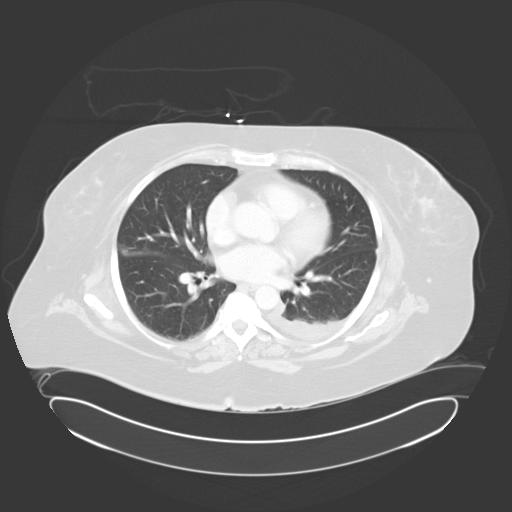

[13 of 32 positions shown; findings below may reference images not displayed]

FINDINGS: Lower chest: There is LEFT lower lobe atelectasis and effusion.

Hepatobiliary: Moderate volume of low-density free fluid surrounding
the liver extending in to the pelvis along the pericolic gutters.
Similar fluid surrounding the spleen and upper abdomen. The volume
of fluid is slightly decreased from comparison exam of 09/04/2014.
There is no biliary duct dilatation. Cholecystectomy clips in the
gallbladder fossa. Common bile duct is mildly dilated 8 mm similar
prior.

Pancreas: Pancreas is normal. No ductal dilatation. No pancreatic
inflammation.

Spleen: Normal spleen

Adrenals/urinary tract: Adrenal glands and kidneys are normal. The
ureters and bladder normal.

Stomach/Bowel: Stomach, small bowel, appendix, and cecum are normal.
The colon and rectosigmoid colon are normal.

Vascular/Lymphatic: Abdominal aorta is normal caliber. There is no
retroperitoneal or periportal lymphadenopathy. No pelvic
lymphadenopathy.

Reproductive: Post hysterectomy.

Musculoskeletal: No aggressive osseous lesion.

Other: There is a new rectus sheath hematoma along the inferior
aspect of the LEFT rectus muscle measuring 6.7 x 4.1 cm on image 81,
series 2. There is a linear metallic density the structure through
the rectus muscle at this level from which extends approximately 10
cm along the abdominal wall within the muscle consistent with a
endovascular coil.

There is a new retroperitoneal hematoma in the RIGHT groin which
displaces the bladder and measures 10.6 x 4.5 cm by 7.4 cm on image
86, series 2.
IMPRESSION: 1. Large volume of intraperitoneal free fluid is slightly decreased
from comparison exam. Etiology includes bile leak or ascites from
cirrhosis. If concern for bile leak consider nuclear medicine HIDA
scan or sampling of the ascitic fluid.
2. No evidence of biloma in the gallbladder fossa.
3. LEFT rectus sheath hematoma with vascular coil. No comparison
available.
4. RIGHT retroperitoneal groin hematoma.  New from 09/04/2014.
Findings conveyed toDr. Walfren Bonza 10/04/2014  at[DATE].

## 2016-02-17 IMAGING — CR DG CHEST 1V PORT
1 series · 1 of 1 positions shown · non-contrast
Comparison: Yesterday

CLINICAL DATA: Fever and shortness of breath

EXAM:
PORTABLE CHEST 1 VIEW

[portable]
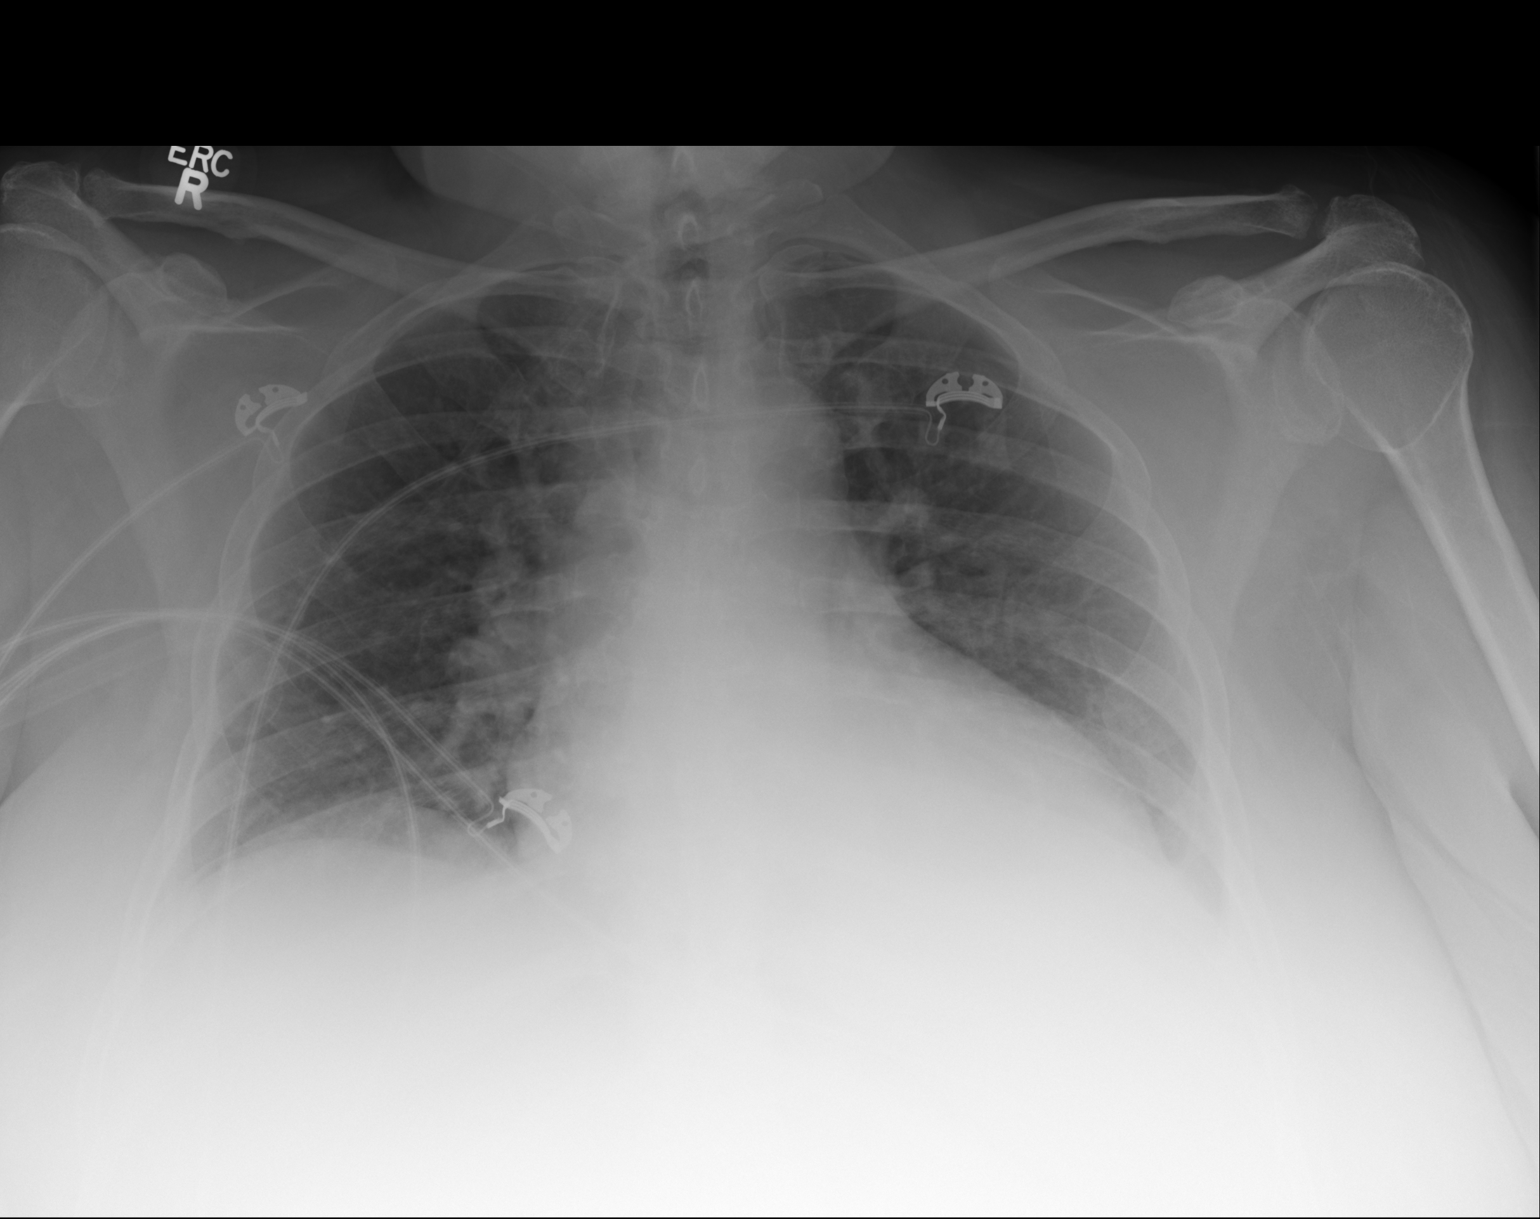

[1 of 1 positions shown; findings below may reference images not displayed]

FINDINGS: Small left pleural effusion. The retrocardiac lung remains poorly
evaluated due to patient size and hypoventilation; no definitive
pneumonia. No edema or air leak. Mild cardiomegaly, accentuated by
technique.
IMPRESSION: 1. No definitive pneumonia, but significantly limited in the
retrocardiac lung due to patient factors.
2. Small left pleural effusion.

## 2016-02-22 ENCOUNTER — Ambulatory Visit: Payer: Medicaid Other

## 2016-02-24 ENCOUNTER — Encounter: Payer: Self-pay | Admitting: Pain Medicine

## 2016-02-24 ENCOUNTER — Telehealth: Payer: Self-pay | Admitting: *Deleted

## 2016-02-24 ENCOUNTER — Ambulatory Visit: Payer: Medicaid Other | Attending: Pain Medicine | Admitting: Pain Medicine

## 2016-02-24 VITALS — BP 135/98 | HR 96 | Temp 98.2°F | Resp 16 | Ht 62.0 in | Wt 250.0 lb

## 2016-02-24 DIAGNOSIS — X58XXXA Exposure to other specified factors, initial encounter: Secondary | ICD-10-CM | POA: Diagnosis not present

## 2016-02-24 DIAGNOSIS — Z9049 Acquired absence of other specified parts of digestive tract: Secondary | ICD-10-CM | POA: Insufficient documentation

## 2016-02-24 DIAGNOSIS — M150 Primary generalized (osteo)arthritis: Secondary | ICD-10-CM

## 2016-02-24 DIAGNOSIS — M791 Myalgia: Secondary | ICD-10-CM | POA: Diagnosis not present

## 2016-02-24 DIAGNOSIS — S161XXA Strain of muscle, fascia and tendon at neck level, initial encounter: Secondary | ICD-10-CM | POA: Diagnosis not present

## 2016-02-24 DIAGNOSIS — Z9851 Tubal ligation status: Secondary | ICD-10-CM | POA: Insufficient documentation

## 2016-02-24 DIAGNOSIS — M7918 Myalgia, other site: Secondary | ICD-10-CM

## 2016-02-24 DIAGNOSIS — Z9889 Other specified postprocedural states: Secondary | ICD-10-CM | POA: Insufficient documentation

## 2016-02-24 DIAGNOSIS — M8949 Other hypertrophic osteoarthropathy, multiple sites: Secondary | ICD-10-CM | POA: Insufficient documentation

## 2016-02-24 DIAGNOSIS — M159 Polyosteoarthritis, unspecified: Secondary | ICD-10-CM

## 2016-02-24 MED ORDER — ROPIVACAINE HCL 2 MG/ML IJ SOLN
4.5000 mL | Freq: Once | INTRAMUSCULAR | Status: AC
  Administered 2016-02-24: 20 mL via EPIDURAL
  Filled 2016-02-24: qty 20

## 2016-02-24 MED ORDER — LIDOCAINE HCL (PF) 1 % IJ SOLN
4.5000 mL | Freq: Once | INTRAMUSCULAR | Status: AC
  Administered 2016-02-24: 5 mL
  Filled 2016-02-24: qty 5

## 2016-02-24 MED ORDER — TRIAMCINOLONE ACETONIDE 40 MG/ML IJ SUSP
40.0000 mg | Freq: Once | INTRAMUSCULAR | Status: AC
  Administered 2016-02-24: 40 mg
  Filled 2016-02-24: qty 1

## 2016-02-24 MED ORDER — DICLOFENAC SODIUM 1 % TD GEL: 4.0000 g | Freq: Four times a day (QID) | TRANSDERMAL | 2 refills | Status: DC

## 2016-02-24 NOTE — Progress Notes (Signed)
Safety precautions to be maintained throughout the outpatient stay will include: orient to surroundings, keep bed in low position, maintain call bell within reach at all times, provide assistance with transfer out of bed and ambulation.  

## 2016-02-24 NOTE — Progress Notes (Signed)
Patient's Name: Lori Mckenzie  MRN: [id number]  Referring Provider: Milinda Pointer, MD  DOB: [birth year]  PCP: Ardine Eng, MD  DOS: 02/24/2016  Note by: Kathlen Brunswick. Dossie Arbour, MD  Service setting: Ambulatory outpatient  Location: [facility] (AMB) Pain Management Facility  Visit type: Procedure  Specialty: Interventional Pain Management  Patient type: Established   Primary Reason for Visit: Interventional Pain Management Treatment. CC: Neck Pain (left)  Procedure:  Anesthesia, Analgesia, Anxiolysis:  Type: Trigger Point Injection (1-2 muscle groups) CPT: 20552 Region:Posterior Cervical Level: Cervical Laterality: Left-Sided Paraspinal  Type: Local Anesthesia Local Anesthetic: Lidocaine 1% Route: Infiltration ([place]/IM) IV Access: Declined Sedation: Declined  Indication(s): Analgesia          Indications: 1. Myofascial pain   2. Acute cervical myofascial strain, initial encounter   3. Osteoarthritis    Pain Score: Pre-procedure: 5 /10 Post-procedure: 3 /10  Pre-op Assessment:  Previous date of service: 02/09/16 Service provided: Med Refill [name] is a 51 y.o. (year old), female patient, seen today for interventional treatment. She  has a past surgical history that includes Abdominal hysterectomy; Tubal ligation; Colonoscopy with propofol (N/A, 06/23/2014); Esophagogastroduodenoscopy (N/A, 06/23/2014); Cholecystectomy (N/A, 07/15/2014); ERCP (N/A, 07/16/2014); Wisdom tooth extraction; ERCP (N/A, 10/03/2014); and Esophagogastroduodenoscopy (egd) with propofol (N/A, 12/01/2015). Her primarily concern today is the Neck Pain (left)  Initial Vital Signs: Blood pressure (!) 128/94, pulse 92, temperature 98.2 F (36.8 C), resp. rate 18, height 5' 2"  (1.575 m), weight 250 lb (113.4 kg), SpO2 100 %. BMI: 45.73 kg/m  Risk Assessment: Allergies: Reviewed. She is allergic to tape and vicodin [hydrocodone-acetaminophen].  Allergy Precautions: None required Coagulopathies: "Reviewed. None identified.   Blood-thinner therapy: None at this time Active Infection(s): Reviewed. None identified. [name] is afebrile  Site Confirmation: [name] was asked to confirm the procedure and laterality before marking the site Procedure checklist: Completed Consent: Before the procedure and under the influence of no sedative(s), amnesic(s), or anxiolytics, the patient was informed of the treatment options, risks and possible complications. To fulfill our ethical and legal obligations, as recommended by the American Medical Association's Code of Ethics, I have informed the patient of my clinical impression; the nature and purpose of the treatment or procedure; the risks, benefits, and possible complications of the intervention; the alternatives, including doing nothing; the risk(s) and benefit(s) of the alternative treatment(s) or procedure(s); and the risk(s) and benefit(s) of doing nothing. The patient was provided information about the general risks and possible complications associated with the procedure. These may include, but are not limited to: failure to achieve desired goals, infection, bleeding, organ or nerve damage, allergic reactions, paralysis, and death. In addition, the patient was informed of those risks and complications associated to the procedure, such as failure to decrease pain; infection; bleeding; organ or nerve damage with subsequent damage to sensory, motor, and/or autonomic systems, resulting in permanent pain, numbness, and/or weakness of one or several areas of the body; allergic reactions; (i.e.: anaphylactic reaction); and/or death. Furthermore, the patient was informed of those risks and complications associated with the medications. These include, but are not limited to: allergic reactions (i.e.: anaphylactic or anaphylactoid reaction(s)); adrenal axis suppression; blood sugar elevation that in diabetics may result in ketoacidosis or comma; water retention that in patients with history  of congestive heart failure may result in shortness of breath, pulmonary edema, and decompensation with resultant heart failure; weight gain; swelling or edema; medication-induced neural toxicity; particulate matter embolism and blood vessel occlusion with resultant organ, and/or nervous  system infarction; and/or aseptic necrosis of one or more joints. Finally, the patient was informed that Medicine is not an exact science; therefore, there is also the possibility of unforeseen or unpredictable risks and/or possible complications that may result in a catastrophic outcome. The patient indicated having understood very clearly. We have given the patient no guarantees and we have made no promises. Enough time was given to the patient to ask questions, all of which were answered to the patient's satisfaction. [name] has indicated that she wanted to continue with the procedure. Attestation: I, the ordering provider, attest that I have discussed with the patient the benefits, risks, side-effects, alternatives, likelihood of achieving goals, and potential problems during recovery for the procedure that I have provided informed consent. Date: 02/24/2016; Time: 8:51 AM  Pre-Procedure Preparation:  Monitoring: As per clinic protocol. Respiration, ETCO2, SpO2, BP, heart rate and rhythm monitor placed and checked for adequate function Safety Precautions: Patient was assessed for positional comfort and pressure points before starting the procedure. Time-out: I initiated and conducted the "Time-out" before starting the procedure, as per protocol. The patient was asked to participate by confirming the accuracy of the "Time Out" information. Verification of the correct person, site, and procedure were performed and confirmed by me, the nursing staff, and the patient. "Time-out" conducted as per Joint Commission's Universal Protocol (UP.01.01.01). "Time-out" Date & Time: 02/24/2016; 0932 hrs.  Description of Procedure  Process:   Position: Sitting Target Area: Trigger Point Approach: Ipsilateral approach. Area Prepped: Entire Posterior Cervical Region Prepping solution: ChloraPrep (2% chlorhexidine gluconate and 70% isopropyl alcohol) Safety Precautions: Aspiration looking for blood return was conducted prior to all injections. At no point did we inject any substances, as a needle was being advanced. No attempts were made at seeking any paresthesias. Safe injection practices and needle disposal techniques used. Medications properly checked for expiration dates. SDV (single dose vial) medications used. Description of the Procedure: Protocol guidelines were followed. The patient was placed in position over the fluoroscopy table. The target area was identified and the area prepped in the usual manner. Skin desensitized using vapocoolant spray. Skin & deeper tissues infiltrated with local anesthetic. Appropriate amount of time allowed to pass for local anesthetics to take effect. The procedure needles were then advanced to the target area. Proper needle placement secured. Negative aspiration confirmed. Solution injected in intermittent fashion, asking for systemic symptoms every 0.5cc of injectate. The needles were then removed and the area cleansed, making sure to leave some of the prepping solution back to take advantage of its long term bactericidal properties. Vitals:   02/24/16 0840 02/24/16 0932 02/24/16 0934  BP: (!) 128/94 (!) 144/97 (!) 135/98  Pulse: 92 94 96  Resp: 18 18 16   Temp: 98.2 F (36.8 C)    SpO2: 100% 100% 98%  Weight: 250 lb (113.4 kg)    Height: 5' 2"  (1.575 m)      Start Time: 0932 hrs. End Time: 0934 hrs. Materials:  Needle(s) Type: Regular needle Gauge: 25G Length: 1.5-in Medication(s): We administered triamcinolone acetonide, ropivacaine (PF) 2 mg/mL (0.2%), and lidocaine (PF). Please see chart orders for dosing details.  Imaging Guidance:  Type of Imaging Technique: None  used Indication(s): N/A Exposure Time: No patient exposure Contrast: None used. Fluoroscopic Guidance: N/A Ultrasound Guidance: N/A Interpretation: N/A  Antibiotic Prophylaxis:  Indication(s): None identified Antibiotic given: None  Post-operative Assessment:  EBL: None Complications: No immediate post-treatment complications observed by team, or reported by patient. Note: The patient tolerated  the entire procedure well. A repeat set of vitals were taken after the procedure and the patient was kept under observation following institutional policy, for this type of procedure. Post-procedural neurological assessment was performed, showing return to baseline, prior to discharge. The patient was provided with post-procedure discharge instructions, including a section on how to identify potential problems. Should any problems arise concerning this procedure, the patient was given instructions to immediately contact us, at any time, without hesitation. In any case, we plan to contact the patient by telephone for a follow-up status report regarding this interventional procedure. Comments:  No additional relevant information.  Plan of Care  Disposition: Discharge home  Discharge Date & Time: 02/24/2016; 0936 hrs.  Physician-requested Follow-up:  Return in about 2 weeks (around 03/09/2016) for Post-Procedure evaluation.  Future Appointments Date Time Provider [place]  03/10/2016 10:30 AM Milinda Pointer, MD [facility] None   Medications ordered for procedure: Meds ordered this encounter  Medications  . triamcinolone acetonide (KENALOG-40) injection 40 mg  . ropivacaine (PF) 2 mg/mL (0.2%) (NAROPIN) injection 4.5 mL  . lidocaine (PF) (XYLOCAINE) 1 % injection 4.5 mL  . diclofenac sodium (VOLTAREN) 1 % GEL    Sig: Apply 4 g topically 4 (four) times daily.    Dispense:  100 g    Refill:  2    Do not add this medication to the electronic "Automatic Refill" notification system. Patient  may have prescription filled one day early if pharmacy is closed on scheduled refill date.   Medications administered: We administered triamcinolone acetonide, ropivacaine (PF) 2 mg/mL (0.2%), and lidocaine (PF).  See the medical record for exact dosing, route, and time of administration.  Lab-work, Procedure(s), & Referral(s) Ordered: Orders Placed This Encounter  Procedures  . Discharge instructions  . Follow-up  . Informed Consent Details: Transcribe to consent form and obtain patient signature  . Provider attestation of informed consent for procedure/surgical case  . Verify informed consent   Imaging Ordered: No results found for this or any previous visit. New Prescriptions   DICLOFENAC SODIUM (VOLTAREN) 1 % GEL    Apply 4 g topically 4 (four) times daily.   Primary Care Physician: Ardine Eng, MD Location: [facility] Note by: Kathlen Brunswick. Dossie Arbour, M.D, DABA, DABAPM, DABPM, DABIPP, FIPP Date: 02/24/2016; Time: 4:10 PM  Disclaimer:  Medicine is not an Chief Strategy Officer. The only guarantee in medicine is that nothing is guaranteed. It is important to note that the decision to proceed with this intervention was based on the information collected from the patient. The Data and conclusions were drawn from the patient's questionnaire, the interview, and the physical examination. Because the information was provided in large part by the patient, it cannot be guaranteed that it has not been purposely or unconsciously manipulated. Every effort has been made to obtain as much relevant data as possible for this evaluation. It is important to note that the conclusions that lead to this procedure are derived in large part from the available data. Always take into account that the treatment will also be dependent on availability of resources and existing treatment guidelines, considered by other Pain Management Practitioners as being common knowledge and practice, at the  time of the intervention. For Medico-Legal purposes, it is also important to point out that variation in procedural techniques and pharmacological choices are the acceptable norm. The indications, contraindications, technique, and results of the above procedure should only be interpreted and judged by a Board-Certified Interventional Pain Specialist  with extensive familiarity and expertise in the same exact procedure and technique. Attempts at providing opinions without similar or greater experience and expertise than that of the treating physician will be considered as inappropriate and unethical, and shall result in a formal complaint to the state medical board and applicable specialty societies.  Instructions provided at this appointment: Patient Instructions    Post-Procedure Pain Diary   Name: Date of Service Procedure      Time Period Pain Score Painful Area  Pre-procedure ____/10   Time Period Pain Score Area improved. Area not improved.  15 to 30 min post-procedure ____/10    1st hour after procedure ____/10    2nd hour after procedure ____/10    3rd hour after procedure ____/10    4th hour after procedure ____/10    5th hour after procedure ____/10    6th hour after procedure ____/10    7th hour after procedure ____/10    Time Period Pain Score Area improved. Area not improved.  Note: From here on, always document your pain score 1st thing in the morning.  1st day after procedure ____/10    2nd day after procedure ____/10    3rd day after procedure ____/10    4th day after procedure ____/10    5th day after procedure ____/10    Time Period Pain Score Area improved. Area not improved.  10th day after procedure ____/10    20th day after procedure ____/10     Benefits Indicate for each set of activities if the procedure changes your ability to accomplish them.  Activity Worse No-Change Improved  Dressing, eating, walking, toileting, hygiene     Shopping, housekeeping, food  preparation, community transportation     Range of motion of affected area      Your opinion Please indicate which statement best describes your impression of this treatment.  Statement (X)  Based on the results, I am encouraged.   Based on the results, I am disappointed.   I am not sure I have an opinion at this point.    Note: Make sure to complete and return this form to your physician, on your follow-up appointment. This information will be used to interpret the results. Failure to accurately complete, or to return this information, may result in less than optimal outcomes. Post-procedure Information What to expect: Most procedures involve the use of a local anesthetic (numbing medicine), and a steroid (anti-inflammatory medicine).  The local anesthetics may cause temporary numbness and weakness of the legs or arms, depending on the location of the block. This numbness/weakness may last 4-6 hours, depending on the local anesthetic used. In rare instances, it can last up to 24 hours. While numb, you must be very careful not to injure the extremity.  After any procedure, you could expect the pain to get better within 15-20 minutes. This relief is temporary and may last 4-6 hours. Once the local anesthetics wears off, you could experience discomfort, possibly more than usual, for up to 10 (ten) days. In the case of radiofrequencies, it may last up to 6 weeks. Surgeries may take up to 8 weeks for the healing process. The discomfort is due to the irritation caused by needles going through skin and muscle. To minimize the discomfort, we recommend using ice the first day, and heat from then on. The ice should be applied for 15 minutes on, and 15 minutes off. Keep repeating this cycle until bedtime. Avoid applying the ice directly to the  skin, to prevent frostbite. Heat should be used daily, until the pain improves (4-10 days). Be careful not to burn yourself.  Occasionally you may experience muscle  spasms or cramps. These occur as a consequence of the irritation caused by the needle sticks to the muscle and the blood that will inevitably be lost into the surrounding muscle tissue. Blood tends to be very irritating to tissues, which tend to react by going into spasm. These spasms may start the same day of your procedure, but they may also take days to develop. This late onset type of spasm or cramp is usually caused by electrolyte imbalances triggered by the steroids, at the level of the kidney. Cramps and spasms tend to respond well to muscle relaxants, multivitamins (some are triggered by the procedure, but may have their origins in vitamin deficiencies), and "Gatorade", or any sports drinks that can replenish any electrolyte imbalances. (If you are a diabetic, ask your pharmacist to get you a sugar-free brand.) Warm showers or baths may also be helpful. Stretching exercises are highly recommended. General Instructions:  Be alert for signs of possible infection: redness, swelling, heat, red streaks, elevated temperature, and/or fever. These typically appear 4 to 6 days after the procedure. Immediately notify your doctor if you experience unusual bleeding, difficulty breathing, or loss of bowel or bladder control. If you experience increased pain, do not increase your pain medicine intake, unless instructed by your pain physician. Post-Procedure Care:  Be careful in moving about. Muscle spasms in the area of the injection may occur. Applying ice or heat to the area is often helpful. The incidence of spinal headaches after epidural injections ranges between 1.4% and 6%. If you develop a headache that does not seem to respond to conservative therapy, please let your physician know. This can be treated with an epidural blood patch.   Post-procedure numbness or redness is to be expected, however it should average 4 to 6 hours. If numbness and weakness of your extremities begins to develop 4 to 6 hours after  your procedure, and is felt to be progressing and worsening, immediately contact your physician.   Diet:  If you experience nausea, do not eat until this sensation goes away. If you had a "Stellate Ganglion Block" for upper extremity "Reflex Sympathetic Dystrophy", do not eat or drink until your hoarseness goes away. In any case, always start with liquids first and if you tolerate them well, then slowly progress to more solid foods. Activity:  For the first 4 to 6 hours after the procedure, use caution in moving about as you may experience numbness and/or weakness. Use caution in cooking, using household electrical appliances, and climbing steps. If you need to reach your Doctor call our office: [phone] Monday-Thursday 8:00 am - 4:00 PM    Fridays: Closed     In case of an emergency: In case of emergency, call 911 or go to the nearest emergency room and have the physician there call us.  Interpretation of Procedure Every nerve block has two components: a diagnostic component, and a treatment component. Unrealistic expectations are the most common causes of "perceived failure".  In a perfect world, a single nerve block should be able to completely and permanently eliminate the pain. Sadly, the world is not perfect.  Most pain management nerve blocks are performed using local anesthetics and steroids. Steroids are responsible for any long-term benefit that you may experience. Their purpose is to decrease any chronic swelling that may exist in the  area. Steroids begin to work immediately after being injected. However, most patients will not experience any benefits until 5 to 10 days after the injection, when the swelling has come down to the point where they can tell a difference. Steroids will only help if there is swelling to be treated. As such, they can assist with the diagnosis. If effective, they suggest an inflammatory component to the pain, and if ineffective, they rule out inflammation as  the main cause or component of the problem. If the problem is one of mechanical compression, you will get no benefit from those steroids.   In the case of local anesthetics, they have a crucial role in the diagnosis of your condition. Most will begin to work within15 to 20 minutes after injection. The duration will depend on the type used (short- vs. Long-acting). It is of outmost importance that patients keep tract of their pain, after the procedure. To assist with this matter, a "Post-procedure Pain Diary" is provided. Make sure to complete it and to bring it back to your follow-up appointment.  As long as the patient keeps accurate, detailed records of their symptoms after every procedure, and returns to have those interpreted, every procedure will provide [place] with invaluable information. Even a block that does not provide the patient with any relief, will always provide [place] with information about the mechanism and the origin of the pain. The only time a nerve block can be considered a waste of time is when patients do not keep track of the results, or do not keep their post-procedure appointment.  Reporting the results back to your physician The Pain Score  Pain is a subjective complaint. It cannot be seen, touched, or measured. We depend entirely on the patient's report of the pain in order to assess your condition and treatment. To evaluate the pain, we use a pain scale, where "0" means "No Pain", and a "10" is "the worst possible pain that you can even imagine" (i.e. something like been eaten alive by a shark or being torn apart by a lion).   You will frequently be asked to rate your pain. Please be as accurate, remember that medical decisions will be based on your responses. Please do not rate your pain above a 10. Doing so is actually interpreted as "symptom magnification" (exaggeration), as well as lack of understanding with regards to the scale. To put this into perspective, when you tell us that  your pain is at a 10 (ten), what you are saying is that there is nothing we can do to make this pain any worse. (Carefully think about that.)

## 2016-02-24 NOTE — Patient Instructions (Signed)
Post-Procedure Pain Diary   Name: Date of Service Procedure      Time Period Pain Score Painful Area  Pre-procedure ____/10   Time Period Pain Score Area improved. Area not improved.  15 to 30 min post-procedure ____/10    1st hour after procedure ____/10    2nd hour after procedure ____/10    3rd hour after procedure ____/10    4th hour after procedure ____/10    5th hour after procedure ____/10    6th hour after procedure ____/10    7th hour after procedure ____/10    Time Period Pain Score Area improved. Area not improved.  Note: From here on, always document your pain score 1st thing in the morning.  1st day after procedure ____/10    2nd day after procedure ____/10    3rd day after procedure ____/10    4th day after procedure ____/10    5th day after procedure ____/10    Time Period Pain Score Area improved. Area not improved.  10th day after procedure ____/10    20th day after procedure ____/10     Benefits Indicate for each set of activities if the procedure changes your ability to accomplish them.  Activity Worse No-Change Improved  Dressing, eating, walking, toileting, hygiene     Shopping, housekeeping, food preparation, community transportation     Range of motion of affected area      Your opinion Please indicate which statement best describes your impression of this treatment.  Statement (X)  Based on the results, I am encouraged.   Based on the results, I am disappointed.   I am not sure I have an opinion at this point.    Note: Make sure to complete and return this form to your physician, on your follow-up appointment. This information will be used to interpret the results. Failure to accurately complete, or to return this information, may result in less than optimal outcomes. Post-procedure Information What to expect: Most procedures involve the use of a local anesthetic (numbing medicine), and a steroid (anti-inflammatory medicine).  The local  anesthetics may cause temporary numbness and weakness of the legs or arms, depending on the location of the block. This numbness/weakness may last 4-6 hours, depending on the local anesthetic used. In rare instances, it can last up to 24 hours. While numb, you must be very careful not to injure the extremity.  After any procedure, you could expect the pain to get better within 15-20 minutes. This relief is temporary and may last 4-6 hours. Once the local anesthetics wears off, you could experience discomfort, possibly more than usual, for up to 10 (ten) days. In the case of radiofrequencies, it may last up to 6 weeks. Surgeries may take up to 8 weeks for the healing process. The discomfort is due to the irritation caused by needles going through skin and muscle. To minimize the discomfort, we recommend using ice the first day, and heat from then on. The ice should be applied for 15 minutes on, and 15 minutes off. Keep repeating this cycle until bedtime. Avoid applying the ice directly to the skin, to prevent frostbite. Heat should be used daily, until the pain improves (4-10 days). Be careful not to burn yourself.  Occasionally you may experience muscle spasms or cramps. These occur as a consequence of the irritation caused by the needle sticks to the muscle and the blood that will inevitably be lost into the surrounding muscle tissue. Blood tends to be  very irritating to tissues, which tend to react by going into spasm. These spasms may start the same day of your procedure, but they may also take days to develop. This late onset type of spasm or cramp is usually caused by electrolyte imbalances triggered by the steroids, at the level of the kidney. Cramps and spasms tend to respond well to muscle relaxants, multivitamins (some are triggered by the procedure, but may have their origins in vitamin deficiencies), and "Gatorade", or any sports drinks that can replenish any electrolyte imbalances. (If you are a  diabetic, ask your pharmacist to get you a sugar-free brand.) Warm showers or baths may also be helpful. Stretching exercises are highly recommended. General Instructions:  Be alert for signs of possible infection: redness, swelling, heat, red streaks, elevated temperature, and/or fever. These typically appear 4 to 6 days after the procedure. Immediately notify your doctor if you experience unusual bleeding, difficulty breathing, or loss of bowel or bladder control. If you experience increased pain, do not increase your pain medicine intake, unless instructed by your pain physician. Post-Procedure Care:  Be careful in moving about. Muscle spasms in the area of the injection may occur. Applying ice or heat to the area is often helpful. The incidence of spinal headaches after epidural injections ranges between 1.4% and 6%. If you develop a headache that does not seem to respond to conservative therapy, please let your physician know. This can be treated with an epidural blood patch.   Post-procedure numbness or redness is to be expected, however it should average 4 to 6 hours. If numbness and weakness of your extremities begins to develop 4 to 6 hours after your procedure, and is felt to be progressing and worsening, immediately contact your physician.   Diet:  If you experience nausea, do not eat until this sensation goes away. If you had a "Stellate Ganglion Block" for upper extremity "Reflex Sympathetic Dystrophy", do not eat or drink until your hoarseness goes away. In any case, always start with liquids first and if you tolerate them well, then slowly progress to more solid foods. Activity:  For the first 4 to 6 hours after the procedure, use caution in moving about as you may experience numbness and/or weakness. Use caution in cooking, using household electrical appliances, and climbing steps. If you need to reach your Doctor call our office: [phone]) [phone] Monday-Thursday 8:00 am - 4:00  PM    Fridays: Closed     In case of an emergency: In case of emergency, call 911 or go to the nearest emergency room and have the physician there call us.  Interpretation of Procedure Every nerve block has two components: a diagnostic component, and a treatment component. Unrealistic expectations are the most common causes of "perceived failure".  In a perfect world, a single nerve block should be able to completely and permanently eliminate the pain. Sadly, the world is not perfect.  Most pain management nerve blocks are performed using local anesthetics and steroids. Steroids are responsible for any long-term benefit that you may experience. Their purpose is to decrease any chronic swelling that may exist in the area. Steroids begin to work immediately after being injected. However, most patients will not experience any benefits until 5 to 10 days after the injection, when the swelling has come down to the point where they can tell a difference. Steroids will only help if there is swelling to be treated. As such, they can assist with the diagnosis. If effective, they suggest  an inflammatory component to the pain, and if ineffective, they rule out inflammation as the main cause or component of the problem. If the problem is one of mechanical compression, you will get no benefit from those steroids.   In the case of local anesthetics, they have a crucial role in the diagnosis of your condition. Most will begin to work within15 to 20 minutes after injection. The duration will depend on the type used (short- vs. Long-acting). It is of outmost importance that patients keep tract of their pain, after the procedure. To assist with this matter, a "Post-procedure Pain Diary" is provided. Make sure to complete it and to bring it back to your follow-up appointment.  As long as the patient keeps accurate, detailed records of their symptoms after every procedure, and returns to have those interpreted, every  procedure will provide [place] with invaluable information. Even a block that does not provide the patient with any relief, will always provide [place] with information about the mechanism and the origin of the pain. The only time a nerve block can be considered a waste of time is when patients do not keep track of the results, or do not keep their post-procedure appointment.  Reporting the results back to your physician The Pain Score  Pain is a subjective complaint. It cannot be seen, touched, or measured. We depend entirely on the patient's report of the pain in order to assess your condition and treatment. To evaluate the pain, we use a pain scale, where "0" means "No Pain", and a "10" is "the worst possible pain that you can even imagine" (i.e. something like been eaten alive by a shark or being torn apart by a lion).   You will frequently be asked to rate your pain. Please be as accurate, remember that medical decisions will be based on your responses. Please do not rate your pain above a 10. Doing so is actually interpreted as "symptom magnification" (exaggeration), as well as lack of understanding with regards to the scale. To put this into perspective, when you tell us that your pain is at a 10 (ten), what you are saying is that there is nothing we can do to make this pain any worse. (Carefully think about that.)

## 2016-02-25 ENCOUNTER — Telehealth: Payer: Self-pay | Admitting: *Deleted

## 2016-02-25 NOTE — Telephone Encounter (Signed)
Denies complications post procedure. 

## 2016-03-01 ENCOUNTER — Ambulatory Visit: Payer: Medicaid Other | Admitting: Pain Medicine

## 2016-03-03 IMAGING — US US PARACENTESIS
1 series · 10 of 10 positions shown · non-contrast
Comparison: none

CLINICAL DATA: History of SBP. Recurrent abdominal distension,
pain, fever

EXAM:
ULTRASOUND GUIDED PARACENTESIS
TECHNIQUE: The procedure, risks (including but not limited to bleeding,
infection, organ damage ), benefits, and alternatives were explained
to the patient. Questions regarding the procedure were encouraged
and answered. The patient understands and consents to the procedure.
Survey ultrasound of the abdomen was performed and an appropriate
skin entry site in the right lateral abdomen was selected. Skin site
was marked, prepped with Betadine, and draped in usual sterile
fashion, and infiltrated locally with 1% lidocaine. A
Safe-T-Centesis needle was advanced into the peritoneal space until
fluid could be aspirated. The sheath was advanced and the needle
removed. 120 mL of [REDACTED] coloredascites were aspirated.
COMPLICATIONS:
COMPLICATIONS
none

[Series 1: us paracentesis · 0.29mm/px · 10 of 10 slices shown]
[im 1/10]
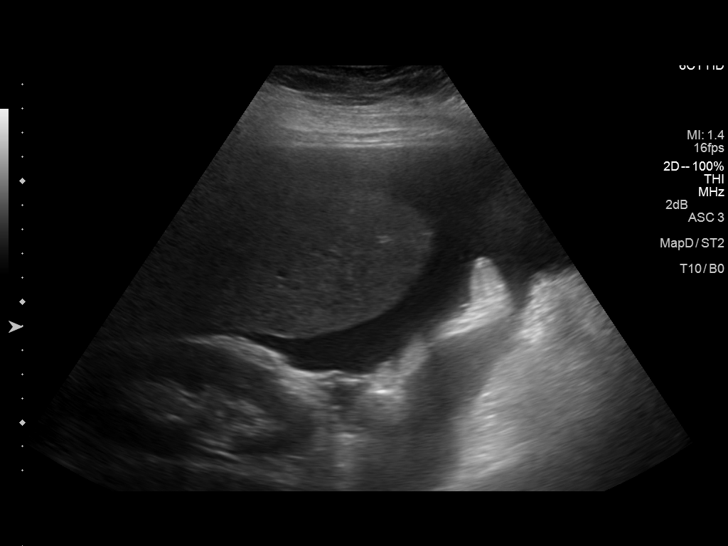
[im 2/10]
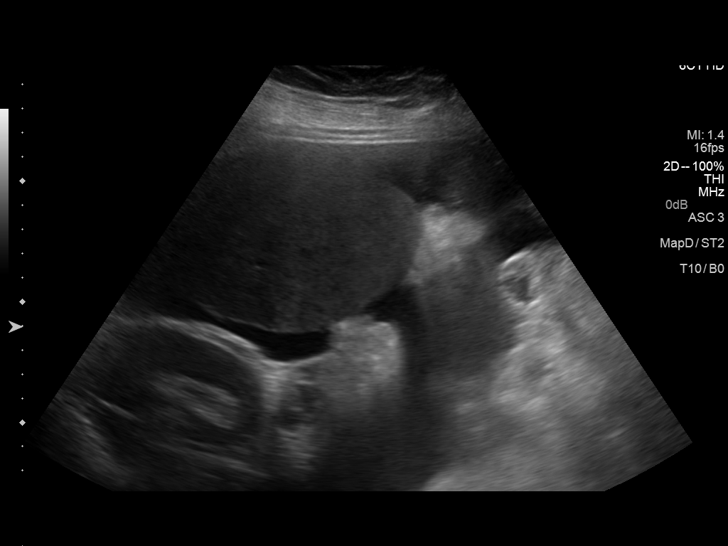
[im 3/10]
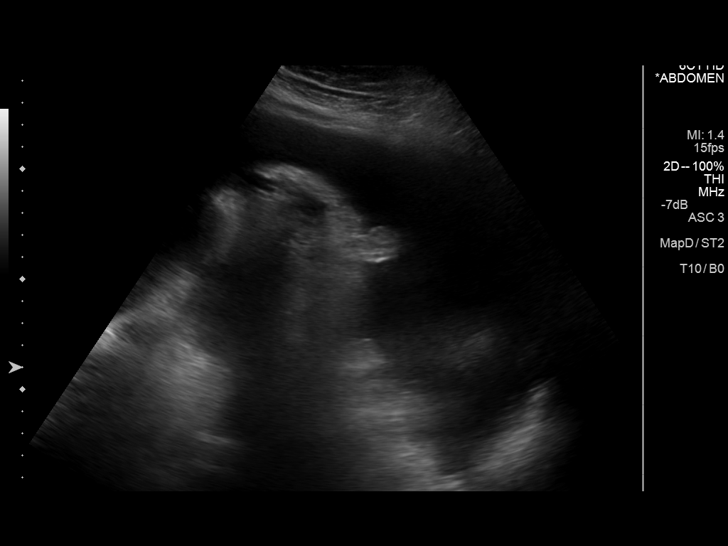
[im 4/10]
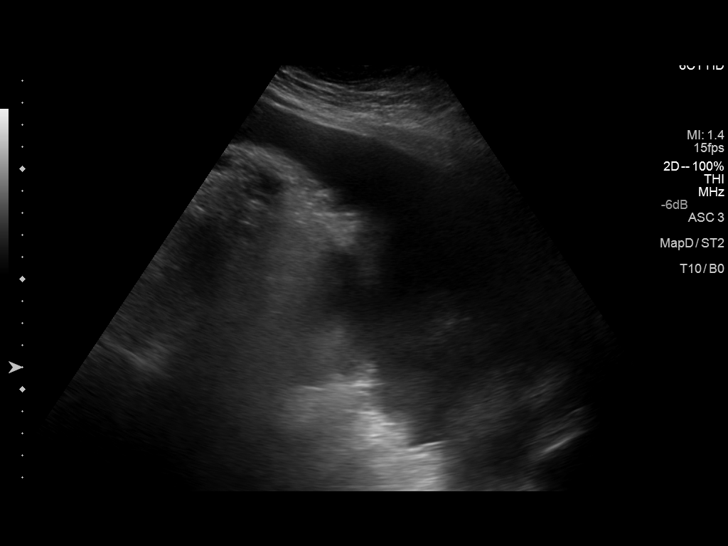
[im 5/10]
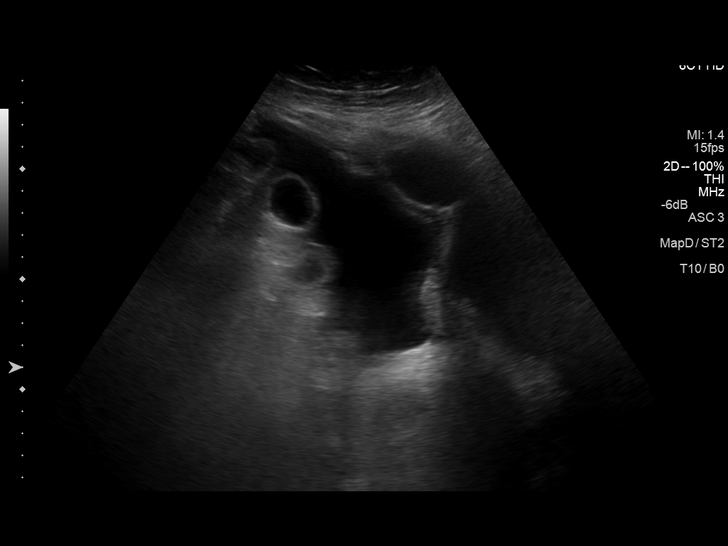
[im 6/10]
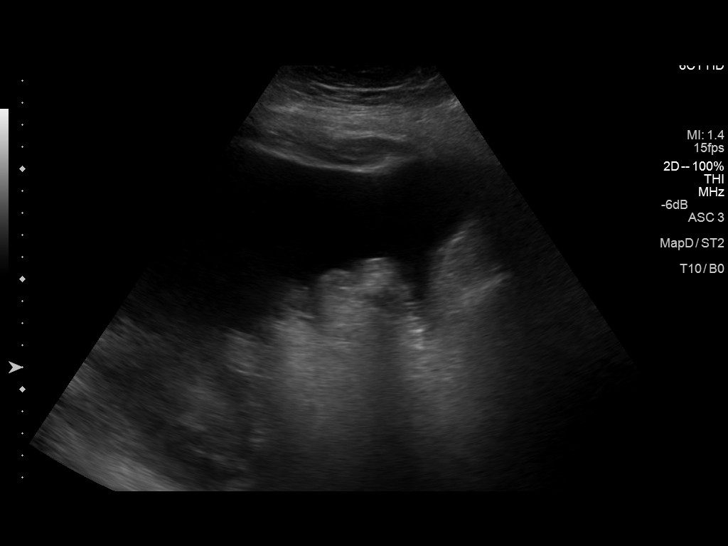
[im 7/10]
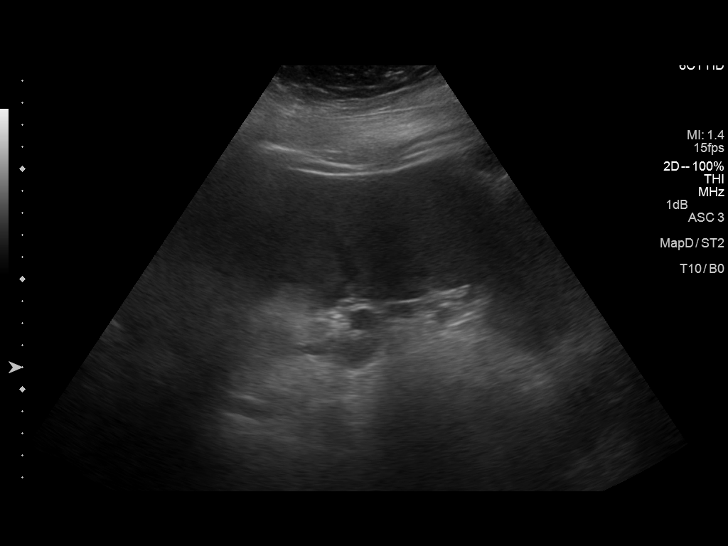
[im 8/10]
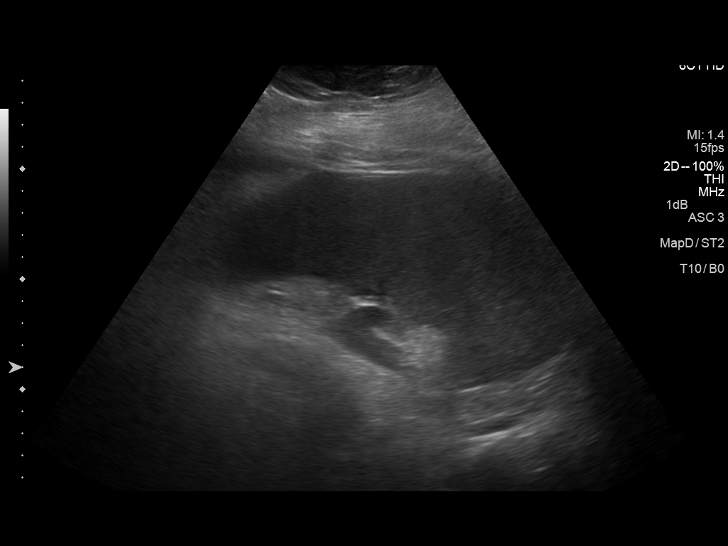
[im 9/10]
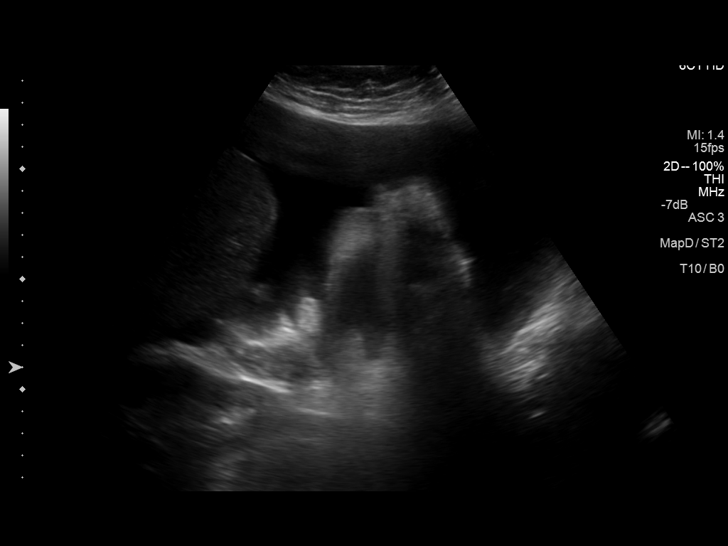
[im 10/10]
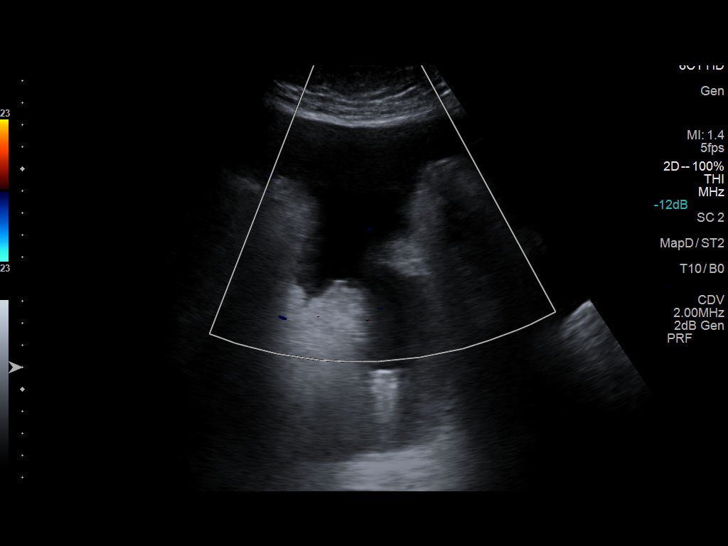

[10 of 10 positions shown; findings below may reference images not displayed]

IMPRESSION: Technically successful ultrasound guided paracentesis, removing 120
mL of ascites.

## 2016-03-10 ENCOUNTER — Encounter: Payer: Medicaid Other | Admitting: Pain Medicine

## 2016-03-10 ENCOUNTER — Encounter: Payer: Self-pay | Admitting: Pain Medicine

## 2016-03-10 ENCOUNTER — Ambulatory Visit: Payer: Medicaid Other | Attending: Pain Medicine | Admitting: Pain Medicine

## 2016-03-10 VITALS — BP 113/54 | HR 100 | Temp 98.0°F | Ht 62.0 in | Wt 250.0 lb

## 2016-03-10 DIAGNOSIS — Z90710 Acquired absence of both cervix and uterus: Secondary | ICD-10-CM | POA: Insufficient documentation

## 2016-03-10 DIAGNOSIS — G8929 Other chronic pain: Secondary | ICD-10-CM | POA: Diagnosis not present

## 2016-03-10 DIAGNOSIS — Z79899 Other long term (current) drug therapy: Secondary | ICD-10-CM | POA: Insufficient documentation

## 2016-03-10 DIAGNOSIS — K219 Gastro-esophageal reflux disease without esophagitis: Secondary | ICD-10-CM | POA: Insufficient documentation

## 2016-03-10 DIAGNOSIS — Z794 Long term (current) use of insulin: Secondary | ICD-10-CM | POA: Insufficient documentation

## 2016-03-10 DIAGNOSIS — M79602 Pain in left arm: Secondary | ICD-10-CM

## 2016-03-10 DIAGNOSIS — M542 Cervicalgia: Secondary | ICD-10-CM | POA: Diagnosis not present

## 2016-03-10 DIAGNOSIS — J449 Chronic obstructive pulmonary disease, unspecified: Secondary | ICD-10-CM | POA: Insufficient documentation

## 2016-03-10 DIAGNOSIS — E119 Type 2 diabetes mellitus without complications: Secondary | ICD-10-CM | POA: Insufficient documentation

## 2016-03-10 DIAGNOSIS — I10 Essential (primary) hypertension: Secondary | ICD-10-CM | POA: Insufficient documentation

## 2016-03-10 DIAGNOSIS — Z79891 Long term (current) use of opiate analgesic: Secondary | ICD-10-CM

## 2016-03-10 DIAGNOSIS — M4722 Other spondylosis with radiculopathy, cervical region: Secondary | ICD-10-CM | POA: Insufficient documentation

## 2016-03-10 DIAGNOSIS — F1190 Opioid use, unspecified, uncomplicated: Secondary | ICD-10-CM | POA: Diagnosis not present

## 2016-03-10 DIAGNOSIS — G894 Chronic pain syndrome: Secondary | ICD-10-CM | POA: Diagnosis not present

## 2016-03-10 DIAGNOSIS — Z7982 Long term (current) use of aspirin: Secondary | ICD-10-CM | POA: Insufficient documentation

## 2016-03-10 DIAGNOSIS — M5412 Radiculopathy, cervical region: Secondary | ICD-10-CM

## 2016-03-10 DIAGNOSIS — S161XXS Strain of muscle, fascia and tendon at neck level, sequela: Secondary | ICD-10-CM | POA: Diagnosis not present

## 2016-03-10 DIAGNOSIS — M545 Low back pain: Secondary | ICD-10-CM | POA: Insufficient documentation

## 2016-03-10 DIAGNOSIS — G4733 Obstructive sleep apnea (adult) (pediatric): Secondary | ICD-10-CM | POA: Insufficient documentation

## 2016-03-10 DIAGNOSIS — Z5181 Encounter for therapeutic drug level monitoring: Secondary | ICD-10-CM | POA: Insufficient documentation

## 2016-03-10 DIAGNOSIS — Z9049 Acquired absence of other specified parts of digestive tract: Secondary | ICD-10-CM | POA: Insufficient documentation

## 2016-03-10 MED ORDER — ORPHENADRINE CITRATE 30 MG/ML IJ SOLN
INTRAMUSCULAR | Status: AC
  Filled 2016-03-10: qty 2

## 2016-03-10 MED ORDER — OXYCODONE HCL 5 MG PO TABS: 5.0000 mg | ORAL_TABLET | Freq: Two times a day (BID) | ORAL | 0 refills | Status: DC

## 2016-03-10 MED ORDER — OXYCODONE HCL 5 MG PO TABS: 5.0000 mg | ORAL_TABLET | Freq: Every day | ORAL | 0 refills | Status: DC

## 2016-03-10 MED ORDER — OXYCODONE HCL 5 MG PO TABS: 5.0000 mg | ORAL_TABLET | Freq: Three times a day (TID) | ORAL | 0 refills | Status: DC

## 2016-03-10 MED ORDER — ORPHENADRINE CITRATE 30 MG/ML IJ SOLN
60.0000 mg | Freq: Once | INTRAMUSCULAR | Status: AC
  Administered 2016-03-10: 60 mg via INTRAMUSCULAR

## 2016-03-10 MED ORDER — OXYCODONE HCL 5 MG PO TABS: 5.0000 mg | ORAL_TABLET | Freq: Four times a day (QID) | ORAL | 0 refills | Status: DC

## 2016-03-10 NOTE — Progress Notes (Signed)
Nursing Pain Medication Assessment:  Safety precautions to be maintained throughout the outpatient stay will include: orient to surroundings, keep bed in low position, maintain call bell within reach at all times, provide assistance with transfer out of bed and ambulation.  Medication Inspection Compliance: Pill count conducted under aseptic conditions, in front of the patient. Neither the pills nor the bottle was removed from the patient's sight at any time. Once count was completed pills were immediately returned to the patient in their original bottle.  Medication: See above Pill/Patch Count: 0 of 7 pills remain Bottle Appearance: Standard pharmacy container. Clearly labeled. Filled Date: 03 / 02 / 2018 Last Medication intake:  Today

## 2016-03-10 NOTE — Patient Instructions (Addendum)
Pain Score  Introduction: The pain score used by this practice is the Verbal Numerical Rating Scale (VNRS-11). This is an 11-point scale. It is for adults and children 10 years or older. There are significant differences in how the pain score is reported, used, and applied. Forget everything you learned in the past and learn this scoring system.  General Information: The scale should reflect your current level of pain. Unless you are specifically asked for the level of your worst pain, or your average pain. If you are asked for one of these two, then it should be understood that it is over the past 24 hours.  Basic Activities of Daily Living (ADL): Personal hygiene, dressing, eating, transferring, and using restroom.  Instructions: Most patients tend to report their level of pain as a combination of two factors, their physical pain and their psychosocial pain. This last one is also known as "suffering" and it is reflection of how physical pain affects you socially and psychologically. From now on, report them separately. From this point on, when asked to report your pain level, report only your physical pain. Use the following table for reference.  Pain Clinic Pain Levels (0-5/10)  Pain Level Score Description  No Pain 0   Mild pain 1 Nagging, annoying, but does not interfere with basic activities of daily living (ADL). Patients are able to eat, bathe, get dressed, toileting (being able to get on and off the toilet and perform personal hygiene functions), transfer (move in and out of bed or a chair without assistance), and maintain continence (able to control bladder and bowel functions). Blood pressure and heart rate are unaffected. A normal heart rate for a healthy adult ranges from 60 to 100 bpm (beats per minute).   Mild to moderate pain 2 Noticeable and distracting. Impossible to hide from other people. More frequent flare-ups. Still possible to adapt and function close to normal. It can be very  annoying and may have occasional stronger flare-ups. With discipline, patients may get used to it and adapt.   Moderate pain 3 Interferes significantly with activities of daily living (ADL). It becomes difficult to feed, bathe, get dressed, get on and off the toilet or to perform personal hygiene functions. Difficult to get in and out of bed or a chair without assistance. Very distracting. With effort, it can be ignored when deeply involved in activities.   Moderately severe pain 4 Impossible to ignore for more than a few minutes. With effort, patients may still be able to manage work or participate in some social activities. Very difficult to concentrate. Signs of autonomic nervous system discharge are evident: dilated pupils (mydriasis); mild sweating (diaphoresis); sleep interference. Heart rate becomes elevated (>115 bpm). Diastolic blood pressure (lower number) rises above 100 mmHg. Patients find relief in laying down and not moving.   Severe pain 5 Intense and extremely unpleasant. Associated with frowning face and frequent crying. Pain overwhelms the senses.  Ability to do any activity or maintain social relationships becomes significantly limited. Conversation becomes difficult. Pacing back and forth is common, as getting into a comfortable position is nearly impossible. Pain wakes you up from deep sleep. Physical signs will be obvious: pupillary dilation; increased sweating; goosebumps; brisk reflexes; cold, clammy hands and feet; nausea, vomiting or dry heaves; loss of appetite; significant sleep disturbance with inability to fall asleep or to remain asleep. When persistent, significant weight loss is observed due to the complete loss of appetite and sleep deprivation.  Blood pressure and heart  rate becomes significantly elevated. Caution: If elevated blood pressure triggers a pounding headache associated with blurred vision, then the patient should immediately seek attention at an urgent or  emergency care unit, as these may be signs of an impending stroke.    Emergency Department Pain Levels (6-10/10)  Emergency Room Pain 6 Severely limiting. Requires emergency care and should not be seen or managed at an outpatient pain management facility. Communication becomes difficult and requires great effort. Assistance to reach the emergency department may be required. Facial flushing and profuse sweating along with potentially dangerous increases in heart rate and blood pressure will be evident.   Distressing pain 7 Self-care is very difficult. Assistance is required to transport, or use restroom. Assistance to reach the emergency department will be required. Tasks requiring coordination, such as bathing and getting dressed become very difficult.   Disabling pain 8 Self-care is no longer possible. At this level, pain is disabling. The individual is unable to do even the most "basic" activities such as walking, eating, bathing, dressing, transferring to a bed, or toileting. Fine motor skills are lost. It is difficult to think clearly.   Incapacitating pain 9 Pain becomes incapacitating. Thought processing is no longer possible. Difficult to remember your own name. Control of movement and coordination are lost.   The worst pain imaginable 10 At this level, most patients pass out from pain. When this level is reached, collapse of the autonomic nervous system occurs, leading to a sudden drop in blood pressure and heart rate. This in turn results in a temporary and dramatic drop in blood flow to the brain, leading to a loss of consciousness. Fainting is one of the body's self defense mechanisms. Passing out puts the brain in a calmed state and causes it to shut down for a while, in order to begin the healing process.    Summary: 1. Refer to this scale when providing [place] with your pain level. 2. Be accurate and careful when reporting your pain level. This will help with your care. 3. Over-reporting  your pain level will lead to loss of credibility. 4. Even a level of 1/10 means that there is pain and will be treated at our facility. 5. High, inaccurate reporting will be documented as "Symptom Exaggeration", leading to loss of credibility and suspicions of possible secondary gains such as obtaining more narcotics, or wanting to appear disabled, for fraudulent reasons. 6. Only pain levels of 5 or below will be seen at our facility. 7. Pain levels of 6 and above will be sent to the Emergency Department and the appointment cancelled. _____________________________________________________________________________________________  GENERAL RISKS AND COMPLICATIONS  What are the risk, side effects and possible complications? Generally speaking, most procedures are safe.  However, with any procedure there are risks, side effects, and the possibility of complications.  The risks and complications are dependent upon the sites that are lesioned, or the type of nerve block to be performed.  The closer the procedure is to the spine, the more serious the risks are.  Great care is taken when placing the radio frequency needles, block needles or lesioning probes, but sometimes complications can occur. 1. Infection: Any time there is an injection through the skin, there is a risk of infection.  This is why sterile conditions are used for these blocks.  There are four possible types of infection. 1. Localized skin infection. 2. Central Nervous System Infection-This can be in the form of Meningitis, which can be deadly. 3. Epidural Infections-This can  be in the form of an epidural abscess, which can cause pressure inside of the spine, causing compression of the spinal cord with subsequent paralysis. This would require an emergency surgery to decompress, and there are no guarantees that the patient would recover from the paralysis. 4. Discitis-This is an infection of the intervertebral discs.  It occurs in about 1% of  discography procedures.  It is difficult to treat and it may lead to surgery.        2. Pain: the needles have to go through skin and soft tissues, will cause soreness.       3. Damage to internal structures:  The nerves to be lesioned may be near blood vessels or    other nerves which can be potentially damaged.       4. Bleeding: Bleeding is more common if the patient is taking blood thinners such as  aspirin, Coumadin, Ticiid, Plavix, etc., or if he/she have some genetic predisposition  such as hemophilia. Bleeding into the spinal canal can cause compression of the spinal  cord with subsequent paralysis.  This would require an emergency surgery to  decompress and there are no guarantees that the patient would recover from the  paralysis.       5. Pneumothorax:  Puncturing of a lung is a possibility, every time a needle is introduced in  the area of the chest or upper back.  Pneumothorax refers to free air around the  collapsed lung(s), inside of the thoracic cavity (chest cavity).  Another two possible  complications related to a similar event would include: Hemothorax and Chylothorax.   These are variations of the Pneumothorax, where instead of air around the collapsed  lung(s), you may have blood or chyle, respectively.       6. Spinal headaches: They may occur with any procedures in the area of the spine.       7. Persistent CSF (Cerebro-Spinal Fluid) leakage: This is a rare problem, but may occur  with prolonged intrathecal or epidural catheters either due to the formation of a fistulous  track or a dural tear.       8. Nerve damage: By working so close to the spinal cord, there is always a possibility of  nerve damage, which could be as serious as a permanent spinal cord injury with  paralysis.       9. Death:  Although rare, severe deadly allergic reactions known as "Anaphylactic  reaction" can occur to any of the medications used.      10. Worsening of the symptoms:  We can always make thing  worse.  What are the chances of something like this happening? Chances of any of this occuring are extremely low.  By statistics, you have more of a chance of getting killed in a motor vehicle accident: while driving to the hospital than any of the above occurring .  Nevertheless, you should be aware that they are possibilities.  In general, it is similar to taking a shower.  Everybody knows that you can slip, hit your head and get killed.  Does that mean that you should not shower again?  Nevertheless always keep in mind that statistics do not mean anything if you happen to be on the wrong side of them.  Even if a procedure has a 1 (one) in a 1,000,000 (million) chance of going wrong, it you happen to be that one..Also, keep in mind that by statistics, you have more of a chance of having  something go wrong when taking medications.  Who should not have this procedure? If you are on a blood thinning medication (e.g. Coumadin, Plavix, see list of "Blood Thinners"), or if you have an active infection going on, you should not have the procedure.  If you are taking any blood thinners, please inform your physician.  How should I prepare for this procedure?  Do not eat or drink anything at least six hours prior to the procedure.  Bring a driver with you .  It cannot be a taxi.  Come accompanied by an adult that can drive you back, and that is strong enough to help you if your legs get weak or numb from the local anesthetic.  Take all of your medicines the morning of the procedure with just enough water to swallow them.  If you have diabetes, make sure that you are scheduled to have your procedure done first thing in the morning, whenever possible.  If you have diabetes, take only half of your insulin dose and notify our nurse that you have done so as soon as you arrive at the clinic.  If you are diabetic, but only take blood sugar pills (oral hypoglycemic), then do not take them on the morning of your  procedure.  You may take them after you have had the procedure.  Do not take aspirin or any aspirin-containing medications, at least eleven (11) days prior to the procedure.  They may prolong bleeding.  Wear loose fitting clothing that may be easy to take off and that you would not mind if it got stained with Betadine or blood.  Do not wear any jewelry or perfume  Remove any nail coloring.  It will interfere with some of our monitoring equipment.  NOTE: Remember that this is not meant to be interpreted as a complete list of all possible complications.  Unforeseen problems may occur.  BLOOD THINNERS The following drugs contain aspirin or other products, which can cause increased bleeding during surgery and should not be taken for 2 weeks prior to and 1 week after surgery.  If you should need take something for relief of minor pain, you may take acetaminophen which is found in Tylenol,m Datril, Anacin-3 and Panadol. It is not blood thinner. The products listed below are.  Do not take any of the products listed below in addition to any listed on your instruction sheet.  A.P.C or A.P.C with Codeine Codeine Phosphate Capsules #3 Ibuprofen Ridaura  ABC compound Congesprin Imuran rimadil  Advil Cope Indocin Robaxisal  Alka-Seltzer Effervescent Pain Reliever and Antacid Coricidin or Coricidin-D  Indomethacin Rufen  Alka-Seltzer plus Cold Medicine Cosprin Ketoprofen S-A-C Tablets  Anacin Analgesic Tablets or Capsules Coumadin Korlgesic Salflex  Anacin Extra Strength Analgesic tablets or capsules CP-2 Tablets Lanoril Salicylate  Anaprox Cuprimine Capsules Levenox Salocol  Anexsia-D Dalteparin Magan Salsalate  Anodynos Darvon compound Magnesium Salicylate Sine-off  Ansaid Dasin Capsules Magsal Sodium Salicylate  Anturane Depen Capsules Marnal Soma  APF Arthritis pain formula Dewitt's Pills Measurin Stanback  Argesic Dia-Gesic Meclofenamic Sulfinpyrazone  Arthritis Bayer Timed Release Aspirin  Diclofenac Meclomen Sulindac  Arthritis pain formula Anacin Dicumarol Medipren Supac  Analgesic (Safety coated) Arthralgen Diffunasal Mefanamic Suprofen  Arthritis Strength Bufferin Dihydrocodeine Mepro Compound Suprol  Arthropan liquid Dopirydamole Methcarbomol with Aspirin Synalgos  ASA tablets/Enseals Disalcid Micrainin Tagament  Ascriptin Doan's Midol Talwin  Ascriptin A/D Dolene Mobidin Tanderil  Ascriptin Extra Strength Dolobid Moblgesic Ticlid  Ascriptin with Codeine Doloprin or Doloprin with Codeine Momentum Tolectin  Asperbuf Duoprin Mono-gesic Trendar  Aspergum Duradyne Motrin or Motrin IB Triminicin  Aspirin plain, buffered or enteric coated Durasal Myochrisine Trigesic  Aspirin Suppositories Easprin Nalfon Trillsate  Aspirin with Codeine Ecotrin Regular or Extra Strength Naprosyn Uracel  Atromid-S Efficin Naproxen Ursinus  Auranofin Capsules Elmiron Neocylate Vanquish  Axotal Emagrin Norgesic Verin  Azathioprine Empirin or Empirin with Codeine Normiflo Vitamin E  Azolid Emprazil Nuprin Voltaren  Bayer Aspirin plain, buffered or children's or timed BC Tablets or powders Encaprin Orgaran Warfarin Sodium  Buff-a-Comp Enoxaparin Orudis Zorpin  Buff-a-Comp with Codeine Equegesic Os-Cal-Gesic   Buffaprin Excedrin plain, buffered or Extra Strength Oxalid   Bufferin Arthritis Strength Feldene Oxphenbutazone   Bufferin plain or Extra Strength Feldene Capsules Oxycodone with Aspirin   Bufferin with Codeine Fenoprofen Fenoprofen Pabalate or Pabalate-SF   Buffets II Flogesic Panagesic   Buffinol plain or Extra Strength Florinal or Florinal with Codeine Panwarfarin   Buf-Tabs Flurbiprofen Penicillamine   Butalbital Compound Four-way cold tablets Penicillin   Butazolidin Fragmin Pepto-Bismol   Carbenicillin Geminisyn Percodan   Carna Arthritis Reliever Geopen Persantine   Carprofen Gold's salt Persistin   Chloramphenicol Goody's Phenylbutazone   Chloromycetin Haltrain Piroxlcam    Clmetidine heparin Plaquenil   Cllnoril Hyco-pap Ponstel   Clofibrate Hydroxy chloroquine Propoxyphen         Before stopping any of these medications, be sure to consult the physician who ordered them.  Some, such as Coumadin (Warfarin) are ordered to prevent or treat serious conditions such as "deep thrombosis", "pumonary embolisms", and other heart problems.  The amount of time that you may need off of the medication may also vary with the medication and the reason for which you were taking it.  If you are taking any of these medications, please make sure you notify your pain physician before you undergo any procedures.         Epidural Steroid Injection Patient Information  Description: The epidural space surrounds the nerves as they exit the spinal cord.  In some patients, the nerves can be compressed and inflamed by a bulging disc or a tight spinal canal (spinal stenosis).  By injecting steroids into the epidural space, we can bring irritated nerves into direct contact with a potentially helpful medication.  These steroids act directly on the irritated nerves and can reduce swelling and inflammation which often leads to decreased pain.  Epidural steroids may be injected anywhere along the spine and from the neck to the low back depending upon the location of your pain.   After numbing the skin with local anesthetic (like Novocaine), a small needle is passed into the epidural space slowly.  You may experience a sensation of pressure while this is being done.  The entire block usually last less than 10 minutes.  Conditions which may be treated by epidural steroids:   Low back and leg pain  Neck and arm pain  Spinal stenosis  Post-laminectomy syndrome  Herpes zoster (shingles) pain  Pain from compression fractures  Preparation for the injection:  1. Do not eat any solid food or dairy products within 8 hours of your appointment.  2. You may drink clear liquids up to 3 hours  before appointment.  Clear liquids include water, black coffee, juice or soda.  No milk or cream please. 3. You may take your regular medication, including pain medications, with a sip of water before your appointment  Diabetics should hold regular insulin (if taken separately) and take 1/2 normal NPH dos the morning  of the procedure.  Carry some sugar containing items with you to your appointment. 4. A driver must accompany you and be prepared to drive you home after your procedure.  5. Bring all your current medications with your. 6. An IV may be inserted and sedation may be given at the discretion of the physician.   7. A blood pressure cuff, EKG and other monitors will often be applied during the procedure.  Some patients may need to have extra oxygen administered for a short period. 8. You will be asked to provide medical information, including your allergies, prior to the procedure.  We must know immediately if you are taking blood thinners (like Coumadin/Warfarin)  Or if you are allergic to IV iodine contrast (dye). We must know if you could possible be pregnant.  Possible side-effects:  Bleeding from needle site  Infection (rare, may require surgery)  Nerve injury (rare)  Numbness & tingling (temporary)  Difficulty urinating (rare, temporary)  Spinal headache ( a headache worse with upright posture)  Light -headedness (temporary)  Pain at injection site (several days)  Decreased blood pressure (temporary)  Weakness in arm/leg (temporary)  Pressure sensation in back/neck (temporary)  Call if you experience:  Fever/chills associated with headache or increased back/neck pain.  Headache worsened by an upright position.  New onset weakness or numbness of an extremity below the injection site  Hives or difficulty breathing (go to the emergency room)  Inflammation or drainage at the infection site  Severe back/neck pain  Any new symptoms which are concerning to  you  Please note:  Although the local anesthetic injected can often make your back or neck feel good for several hours after the injection, the pain will likely return.  It takes 3-7 days for steroids to work in the epidural space.  You may not notice any pain relief for at least that one week.  If effective, we will often do a series of three injections spaced 3-6 weeks apart to maximally decrease your pain.  After the initial series, we generally will wait several months before considering a repeat injection of the same type.  If you have any questions, please call [phone] [facility]

## 2016-03-10 NOTE — Progress Notes (Signed)
Patient's Name: Lori Mckenzie  MRN: [id number]  Referring Provider: Ardine Eng, MD  DOB: [birth year]  PCP: Ardine Eng, MD  DOS: 03/10/2016  Note by: Kathlen Brunswick. Dossie Arbour, MD  Service setting: Ambulatory outpatient  Specialty: Interventional Pain Management  Location: [facility] (AMB) Pain Management Facility    Patient type: Established   Primary Reason(s) for Visit: Encounter for prescription drug management & post-procedure evaluation of chronic illness with mild to moderate exacerbation(Level of risk: moderate) CC: Back Pain (low to mid); Neck Pain; and Arm Pain (left)  HPI  Lori Mckenzie is a 51 y.o. year old, female patient, who comes today for a post-procedure evaluation and medication management. She has Type 2 diabetes mellitus ([place]); COPD (chronic obstructive pulmonary disease) (HCC); HTN (hypertension); GERD (gastroesophageal reflux disease); OSA on CPAP; Anxiety; Steatohepatitis; Bile leak, postoperative; Dysthymia; Other social stressor; Abdominal abscess ([place]); Ascites; NASH (nonalcoholic steatohepatitis); Hypokalemia; Pyrexia; Opiate use (75 MME/Day); Long term prescription opiate use; Long term current use of opiate analgesic; Encounter for therapeutic drug level monitoring; Chronic epigastric abdominal pain; Chronic low back pain (Location of Primary Source of Pain) (Bilateral) (R>L); Chronic neck pain (Location of Tertiary source of pain) (Bilateral) (R>L); Breath shortness; Diastolic dysfunction; Clinical depression; Chronic obstructive pulmonary disease ([place]); Airway hyperreactivity; Anxiety state; Acid reflux; Essential (primary) hypertension; 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, L1-2, & L4-5); Lumbar and sacral osteoarthritis; Myofascial pain; Sleep apnea; Fall; Drowsiness; Episode of syncope; Subacute lumbar radiculopathy (left side) (S1 dermatome); Vitamin D deficiency; B12 deficiency; Folate deficiency; Thoracic radiculitis; Major depressive disorder with single episode; Elevated  sedimentation rate; Elevated C-reactive protein (CRP); Lumbar facet syndrome (Location of Primary Source of Pain) (Bilateral) (R>L); Cervical spondylosis; Chronic feet pain (Location of Secondary source of pain) (Bilateral) (R>L); Lumbar spondylosis; Encounter for chronic pain management; Chronic shoulder pain (Bilateral) (R>L); Chronic carpal tunnel syndrome (Bilateral); Chronic hip pain (Bilateral) (L>R); Chronic upper back pain (Bilateral) (L>R); Osteoporosis, idiopathic; Abnormal MRI, lumbar spine (02/03/2015); Hepatic encephalopathy ([place]); Radicular pain of thoracic region; Altered mental status; Cirrhosis of liver without ascites ([place]); Elevated liver enzymes; Opiate withdrawal ([place]); Chronic pain syndrome; Asthma; Depressive disorder; Esophageal reflux; Essential hypertension; Fever; Hypertension; Left upper quadrant pain; Lumbar spinal stenosis; Lumbosacral spondylosis without myelopathy; Major depressive disorder, single episode; Neuromyositis; Other chronic pain; Shock ([place]); Somnolence; Spinal stenosis of lumbar region without neurogenic claudication; Spondylosis of lumbar region without myelopathy or radiculopathy; Syncope; Acute cervical myofascial strain; Allodynia; Osteoarthritis; Chronic pain of left upper extremity; and Chronic radicular cervical pain (L) on her problem list. Her primarily concern today is the Back Pain (low to mid); Neck Pain; and Arm Pain (left)  Pain Assessment: Self-Reported Pain Score: 5 /10             Reported level is compatible with observation.       Pain Location: Arm Pain Orientation: Left Pain Descriptors / Indicators: Spasm, Stabbing, Constant Pain Frequency: Constant  Lori Mckenzie was last seen on 02/24/2016 for a procedure. During today's appointment we reviewed Lori Mckenzie's post-procedure results, as well as her outpatient medication regimen.  Further details on both, my assessment(s), as well as the proposed treatment plan, please see below.  Controlled  Substance Pharmacotherapy Assessment REMS (Risk Evaluation and Mitigation Strategy)  Analgesic: Oxycodone IR 10 mg by mouth 3 times a day (currently undergoing a taper to 0 mg/day) MME/day: 45 mg/day.  Angelique Holm, RN  03/10/2016 10:59 AM  Sign at close encounter Nursing Pain Medication Assessment:  Safety precautions to be maintained throughout the  outpatient stay will include: orient to surroundings, keep bed in low position, maintain call bell within reach at all times, provide assistance with transfer out of bed and ambulation.  Medication Inspection Compliance: Pill count conducted under aseptic conditions, in front of the patient. Neither the pills nor the bottle was removed from the patient's sight at any time. Once count was completed pills were immediately returned to the patient in their original bottle.  Medication: See above Pill/Patch Count: 0 of 7 pills remain Bottle Appearance: Standard pharmacy container. Clearly labeled. Filled Date: 03 / 02 / 2018 Last Medication intake:  Today   Pharmacokinetics: Liberation and absorption (onset of action): WNL Distribution (time to peak effect): WNL Metabolism and excretion (duration of action): WNL         Pharmacodynamics: Desired effects: Analgesia: Lori Mckenzie reports >50% benefit. Functional ability: Patient reports that medication allows her to accomplish basic ADLs Clinically meaningful improvement in function (CMIF): Sustained CMIF goals met Perceived effectiveness: Described as relatively effective, allowing for increase in activities of daily living (ADL) Undesirable effects: Side-effects or Adverse reactions: None reported Monitoring: [place] PMP: Online review of the past 34-monthperiod conducted. Compliant with practice rules and regulations List of all UDS test(s) done:  Lab Results  Component Value Date   TOXASSSELUR FINAL 01/07/2016   TMcGovernFINAL 09/28/2015   TMiddle PointFINAL 03/05/2015   TOXASSSELUR FINAL  02/05/2015   TOXASSSELUR FINAL 01/08/2015   Last UDS on record: ToxAssure Select 13  Date Value Ref Range Status  01/07/2016 FINAL  Final    Comment:    ==================================================================== TOXASSURE SELECT 13 (MW) ==================================================================== Test                             Result       Flag       Units Drug Present and Declared for Prescription Verification   Alprazolam                     164          EXPECTED   ng/mg creat   Alpha-hydroxyalprazolam        410          EXPECTED   ng/mg creat    Source of alprazolam is a scheduled prescription medication.    Alpha-hydroxyalprazolam is an expected metabolite of alprazolam. Drug Absent but Declared for Prescription Verification   Oxycodone                      Not Detected UNEXPECTED ng/mg creat ==================================================================== Test                      Result    Flag   Units      Ref Range   Creatinine              140              mg/dL      >=20 ==================================================================== Declared Medications:  The flagging and interpretation on this report are based on the  following declared medications.  Unexpected results may arise from  inaccuracies in the declared medications.  **Note: The testing scope of this panel includes these medications:  Alprazolam  Oxycodone  **Note: The testing scope of this panel does not include following  reported medications:  Albuterol  Aspirin  Cholecalciferol  Citalopram (Celexa)  Cyanocobalamin  Dicyclomine  Fluticasone (  Flonase)  Folic acid (Folate)  Furosemide  Gabapentin  Hydrochlorothiazide  Ipratropium  Lactulose  Naloxone (Narcan)  Nystatin  Ondansetron  Pantoprazole  Promethazine  Rifaximin  Rizatriptan  Spironolactone  Vitamin B12 ==================================================================== For clinical consultation,  please call [phone]. ====================================================================    UDS interpretation: Non-Compliant The patient is currently being tapered off of all opioids due to her abnormal UDS findings and an anonymous phone call that we received indicating that the patient was selling her narcotics. Unfortunately, her UDS results would be compatible with this. Medication Assessment Form: Reviewed. Abnormalities discussed Treatment compliance: Non-compliant Risk Assessment Profile: Aberrant behavior: See prior evaluations. None observed or detected today Comorbid factors increasing risk of overdose: See prior notes. No additional risks detected today Risk of substance use disorder (SUD): Very High Opioid Risk Tool (ORT) Total Score: 8  Interpretation Table:  Score <3 = Low Risk for SUD  Score between 4-7 = Moderate Risk for SUD  Score >8 = High Risk for Opioid Abuse   Risk Mitigation Strategies:  Patient Counseling: Covered Patient-Prescriber Agreement (PPA): Present and active  Notification to other healthcare providers: Done  Pharmacologic Plan: The patient is being tapered off of all of her opioids.  Post-Procedure Assessment  02/24/2016 Procedure: Diagnostic left trapezius muscle trigger point injection Post-procedure pain score: 3/10 Initially the patient indicated that her pain was a 5/10. After the procedure went down to a 3/10. Influential Factors: BMI: 45.73 kg/m Intra-procedural challenges: None observed Assessment challenges: None detected         Post-procedural side-effects, adverse reactions, or complications: None reported Reported issues: None  Sedation: No sedation used. When no sedatives are used, the analgesic levels obtained are directly associated to the effectiveness of the local anesthetics. However, when sedation is provided, the level of analgesia obtained during the initial 1 hour following the intervention, is believed to be the  result of a combination of factors. These factors may include, but are not limited to: 1. The effectiveness of the local anesthetics used. 2. The effects of the analgesic(s) and/or anxiolytic(s) used. 3. The degree of discomfort experienced by the patient at the time of the procedure. 4. The patients ability and reliability in recalling and recording the events. 5. The presence and influence of possible secondary gains and/or psychosocial factors. Reported result: Relief experienced during the 1st hour after the procedure: 20 % (Ultra-Short Term Relief) Interpretative annotation: Partial relief from local anesthetics would suggest that the injected area is not 100% responsible for the patient's symptoms.          Effects of local anesthetic: The analgesic effects attained during this period are directly associated to the localized infiltration of local anesthetics and therefore cary significant diagnostic value as to the etiological location, or anatomical origin, of the pain. Expected duration of relief is directly dependent on the pharmacodynamics of the local anesthetic used. Long-acting (4-6 hours) anesthetics used.  Reported result: Relief during the next 4 to 6 hour after the procedure: 20 % (Short-Term Relief) Interpretative annotation: Partial relief would suggest incomplete involvement of injected area.          Long-term benefit: Defined as the period of time past the expected duration of local anesthetics. With the possible exception of prolonged sympathetic blockade from the local anesthetics, benefits during this period are typically attributed to, or associated with, other factors such as analgesic sensory neuropraxia, antiinflammatory effects, or beneficial biochemical changes provided by agents other than the local anesthetics Reported result: Extended relief following  procedure: 0 % (Long-Term Relief) Interpretative annotation: No long-term benefit. This could suggest limited  inflammatory component to the pain with possible mechanical aggravating factors.          Current benefits: Defined as persistent relief that continues at this point in time.   Reported results: Treated area: 0 %       Interpretative annotation: Recurrance of symptoms. This would suggest persistent aggravating factors  Interpretation: Results would suggest a successful diagnostic intervention.          Laboratory Chemistry  Inflammation Markers Lab Results  Component Value Date   CRP 2.8 (H) 01/08/2015   ESRSEDRATE 35 (H) 01/08/2015   (CRP: Acute Phase) (ESR: Chronic Phase) Renal Function Markers Lab Results  Component Value Date   BUN 15 08/19/2015   CREATININE 1.09 (H) 08/19/2015   GFRAA >60 08/19/2015   GFRNONAA 57 (L) 08/19/2015   Hepatic Function Markers Lab Results  Component Value Date   AST 65 (H) 08/19/2015   ALT 82 (H) 08/19/2015   ALBUMIN 4.8 08/19/2015   ALKPHOS 116 08/19/2015   Electrolytes Lab Results  Component Value Date   NA 135 08/19/2015   K 4.3 08/19/2015   CL 99 (L) 08/19/2015   CALCIUM 9.4 08/19/2015   MG 1.9 01/08/2015   Neuropathy Markers Lab Results  Component Value Date   VITAMINB12 172 (L) 01/08/2015   Bone Pathology Markers Lab Results  Component Value Date   ALKPHOS 116 08/19/2015   VD25OH 14.3 (L) 01/08/2015   VD125OH2TOT 28.8 01/08/2015   CALCIUM 9.4 08/19/2015   Coagulation Parameters Lab Results  Component Value Date   INR 1.18 12/01/2015   LABPROT 15.1 12/01/2015   APTT 24.6 08/26/2013   PLT 98 (L) 12/01/2015   Cardiovascular Markers Lab Results  Component Value Date   BNP 11 12/11/2012   HGB 15.4 12/01/2015   HCT 43.5 12/01/2015   Note: Lab results reviewed.  Recent Diagnostic Imaging Review  No results found. Note: Imaging results reviewed.          Meds  The patient has a current medication list which includes the following prescription(s): albuterol, albuterol, alprazolam, aspirin ec, cholecalciferol,  citalopram, diclofenac sodium, dicyclomine, fluticasone, intrinsi b12-folate, furosemide, gabapentin, insulin aspart, insulin detemir, ipratropium, lactulose, naloxone, nystatin, oxycodone, oxycodone, oxycodone, oxycodone, oxycodone, pantoprazole, promethazine, rifaximin, rizatriptan, spironolactone, sucralfate, and vitamin b-12.  Current Outpatient Prescriptions on File Prior to Visit  Medication Sig  . albuterol (ACCUNEB) 0.63 MG/3ML nebulizer solution Inhale 1 ampule by nebulization every six (6) hours as needed for wheezing.  Marland Kitchen albuterol (PROVENTIL HFA;VENTOLIN HFA) 108 (90 BASE) MCG/ACT inhaler Inhale 2 puffs into the lungs every 4 (four) hours as needed for wheezing or shortness of breath.  . ALPRAZolam (XANAX) 1 MG tablet Take 1-2 mg by mouth 3 (three) times daily as needed for anxiety.  Marland Kitchen aspirin EC 81 MG tablet Take 81 mg by mouth daily.   . Cholecalciferol (VITAMIN D-1000 MAX ST) 1000 units tablet Take 2,000 Units by mouth.   . citalopram (CELEXA) 20 MG tablet Take 20 mg by mouth.  . diclofenac sodium (VOLTAREN) 1 % GEL Apply 4 g topically 4 (four) times daily.  Marland Kitchen dicyclomine (BENTYL) 10 MG capsule Take 10 mg by mouth 4 (four) times daily -  before meals and at bedtime.   . fluticasone (FLONASE) 50 MCG/ACT nasal spray Place 1-2 sprays into both nostrils daily as needed for rhinitis.   Derald Macleod Factor (INTRINSI B12-FOLATE) 409-811-91 MCG-MCG-MG TABS Take 1 tablet  by mouth daily. Reported on 04/06/2015  . furosemide (LASIX) 40 MG tablet Take 80 mg by mouth daily.   Marland Kitchen gabapentin (NEURONTIN) 300 MG capsule Take 900 mg by mouth 4 (four) times daily as needed (for pain).   . Insulin Detemir (LEVEMIR FLEXPEN [place]) Inject 30 Units into the skin.   Marland Kitchen ipratropium (ATROVENT) 0.02 % nebulizer solution Inhale 500 mcg by nebulization Four (4) times a day.  . lactulose (CHRONULAC) 10 GM/15ML solution Take 30 g by mouth 3 (three) times daily as needed.   . naloxone [facility]) 2 MG/2ML injection  Inject into the vein as needed.  . nystatin (MYCOSTATIN) powder Apply 1 g topically 3 (three) times daily as needed (for irritation).   . pantoprazole (PROTONIX) 40 MG tablet Take 40 mg by mouth daily.   . promethazine (PHENERGAN) 12.5 MG tablet Take 12.5 mg by mouth every 6 (six) hours as needed for nausea or vomiting.   . rifaximin (XIFAXAN) 550 MG TABS tablet Take 550 mg by mouth 2 (two) times daily.   . rizatriptan (MAXALT) 10 MG tablet Take 10 mg by mouth as needed for migraine.   Marland Kitchen spironolactone (ALDACTONE) 100 MG tablet Take 100 mg by mouth daily.   . sucralfate (CARAFATE) 1 g tablet Take by mouth.  . vitamin B-12 (CYANOCOBALAMIN) 1000 MCG tablet Take 2,000 mcg by mouth daily.  . [DISCONTINUED] SUMAtriptan (IMITREX) 50 MG tablet Take 50 mg by mouth every 2 (two) hours as needed. For migraines   No current facility-administered medications on file prior to visit.    ROS  Constitutional: Denies any fever or chills Gastrointestinal: No reported hemesis, hematochezia, vomiting, or acute GI distress Musculoskeletal: Denies any acute onset joint swelling, redness, loss of ROM, or weakness Neurological: No reported episodes of acute onset apraxia, aphasia, dysarthria, agnosia, amnesia, paralysis, loss of coordination, or loss of consciousness  Allergies  Lori Mckenzie is allergic to tape and vicodin [hydrocodone-acetaminophen].  PFSH  Drug: Lori Mckenzie  reports that she does not use drugs. Alcohol:  reports that she does not drink alcohol. Tobacco:  reports that she has never smoked. She has never used smokeless tobacco. Medical:  has a past medical history of Anxiety; Ascites; Asthma; Back pain; Brittle bone disease; Cancer ([place]); Cervical disc disease; Chronic kidney disease; Collagen vascular disease ([place]); COPD (chronic obstructive pulmonary disease) ([place]); Diabetes mellitus without complication ([place]); GERD (gastroesophageal reflux disease); Hypertension; Hypothyroidism; Migraines; NASH  (nonalcoholic steatohepatitis); Respiratory infection; Sleep apnea; Sleep apnea; Thyroid disease; and TIA (transient ischemic attack). Family: family history includes Heart disease in her brother and sister; Lung cancer in her father; Ulcers in her father and sister.  Past Surgical History:  Procedure Laterality Date  . ABDOMINAL HYSTERECTOMY    . CHOLECYSTECTOMY N/A 07/15/2014   Procedure: LAPAROSCOPIC CHOLECYSTECTOMY with liver biopsy ;  Surgeon: Sherri Rad, MD;  Location: [facility] ORS;  Service: General;  Laterality: N/A;  . COLONOSCOPY WITH PROPOFOL N/A 06/23/2014   Procedure: COLONOSCOPY WITH PROPOFOL;  Surgeon: Lollie Sails, MD;  Location: [facility] ENDOSCOPY;  Service: Endoscopy;  Laterality: N/A;  . ERCP N/A 07/16/2014   Procedure: ENDOSCOPIC RETROGRADE CHOLANGIOPANCREATOGRAPHY (ERCP);  Surgeon: Clarene Essex, MD;  Location: [facility];  Service: Endoscopy;  Laterality: N/A;  . ERCP N/A 10/03/2014   Procedure: ENDOSCOPIC RETROGRADE CHOLANGIOPANCREATOGRAPHY (ERCP);  Surgeon: Hulen Luster, MD;  Location: [facility] ENDOSCOPY;  Service: Gastroenterology;  Laterality: N/A;  . ESOPHAGOGASTRODUODENOSCOPY N/A 06/23/2014   Procedure: ESOPHAGOGASTRODUODENOSCOPY (EGD);  Surgeon: Lollie Sails, MD;  Location: [facility]  ENDOSCOPY;  Service: Endoscopy;  Laterality: N/A;  . ESOPHAGOGASTRODUODENOSCOPY (EGD) WITH PROPOFOL N/A 12/01/2015   Procedure: ESOPHAGOGASTRODUODENOSCOPY (EGD) WITH PROPOFOL;  Surgeon: Lollie Sails, MD;  Location: [facility] ENDOSCOPY;  Service: Endoscopy;  Laterality: N/A;  . TUBAL LIGATION    . WISDOM TOOTH EXTRACTION     Constitutional Exam  General appearance: Well nourished, well developed, and well hydrated. In no apparent acute distress Vitals:   03/10/16 1103  BP: (!) 113/54  Pulse: 100  Temp: 98 F (36.7 C)  TempSrc: Oral  SpO2: 100%  Weight: 250 lb (113.4 kg)  Height: _0  (1.575 m)   BMI Assessment: Estimated body mass index is 45.73 kg/m as calculated from the following:    Height as of this encounter: _1  (1.575 m).   Weight as of this encounter: 250 lb (113.4 kg).  BMI interpretation table: BMI level Category Range association with higher incidence of chronic pain  <18 kg/m2 Underweight   18.5-24.9 kg/m2 Ideal body weight   25-29.9 kg/m2 Overweight Increased incidence by 20%  30-34.9 kg/m2 Obese (Class I) Increased incidence by 68%  35-39.9 kg/m2 Severe obesity (Class II) Increased incidence by 136%  >40 kg/m2 Extreme obesity (Class III) Increased incidence by 254%   BMI Readings from Last 4 Encounters:  03/10/16 45.73 kg/m  02/24/16 45.73 kg/m  02/09/16 45.73 kg/m  01/07/16 45.73 kg/m   Wt Readings from Last 4 Encounters:  03/10/16 250 lb (113.4 kg)  02/24/16 250 lb (113.4 kg)  02/09/16 250 lb (113.4 kg)  01/07/16 250 lb (113.4 kg)  Psych/Mental status: Alert, oriented x 3 (person, place, & time)       Eyes: PERLA Respiratory: No evidence of acute respiratory distress  Cervical Spine Exam  Inspection: No masses, redness, or swelling Alignment: Symmetrical Functional ROM: Unrestricted ROM Stability: No instability detected Muscle strength & Tone: Functionally intact Sensory: Unimpaired Palpation: Non-contributory  Upper Extremity (UE) Exam    Side: Right upper extremity  Side: Left upper extremity  Inspection: No masses, redness, swelling, or asymmetry. No contractures  Inspection: No masses, redness, swelling, or asymmetry. No contractures  Functional ROM: Unrestricted ROM          Functional ROM: Unrestricted ROM          Muscle strength & Tone: Functionally intact  Muscle strength & Tone: Functionally intact  Sensory: Unimpaired  Sensory: Unimpaired  Palpation: Euthermic  Palpation: Euthermic  Specialized Test(s): Deferred         Specialized Test(s): Deferred          Thoracic Spine Exam  Inspection: No masses, redness, or swelling Alignment: Symmetrical Functional ROM: Unrestricted ROM Stability: No instability  detected Sensory: Unimpaired Muscle strength & Tone: Functionally intact Palpation: Non-contributory  Lumbar Spine Exam  Inspection: No masses, redness, or swelling Alignment: Symmetrical Functional ROM: Unrestricted ROM Stability: No instability detected Muscle strength & Tone: Functionally intact Sensory: Unimpaired Palpation: Non-contributory Provocative Tests: Lumbar Hyperextension and rotation test: evaluation deferred today       Patrick's Maneuver: evaluation deferred today              Gait & Posture Assessment  Ambulation: Unassisted Gait: Relatively normal for age and body habitus Posture: WNL   Lower Extremity Exam    Side: Right lower extremity  Side: Left lower extremity  Inspection: No masses, redness, swelling, or asymmetry. No contractures  Inspection: No masses, redness, swelling, or asymmetry. No contractures  Functional ROM: Unrestricted ROM  Functional ROM: Unrestricted ROM          Muscle strength & Tone: Functionally intact  Muscle strength & Tone: Functionally intact  Sensory: Unimpaired  Sensory: Unimpaired  Palpation: No palpable anomalies  Palpation: No palpable anomalies   Assessment  Primary Diagnosis & Pertinent Problem List: The primary encounter diagnosis was Chronic pain syndrome. Diagnoses of Chronic radicular cervical pain (L), Osteoarthritis of spine with radiculopathy, cervical region, Chronic pain of left upper extremity, Acute cervical myofascial strain, sequela, Chronic neck pain (Location of Tertiary source of pain) (Bilateral) (R>L), Long term current use of opiate analgesic, Long term prescription opiate use, and Opiate use (75 MME/Day) were also pertinent to this visit.  Status Diagnosis  Controlled Controlled Controlled 1. Chronic pain syndrome   2. Chronic radicular cervical pain (L)   3. Osteoarthritis of spine with radiculopathy, cervical region   4. Chronic pain of left upper extremity   5. Acute cervical myofascial  strain, sequela   6. Chronic neck pain (Location of Tertiary source of pain) (Bilateral) (R>L)   7. Long term current use of opiate analgesic   8. Long term prescription opiate use   9. Opiate use (75 MME/Day)      Plan of Care  Pharmacotherapy (Medications Ordered): Meds ordered this encounter  Medications  . orphenadrine (NORFLEX) injection 60 mg  . oxyCODONE (OXY IR/ROXICODONE) 5 MG immediate release tablet    Sig: Take 1 tablet (5 mg total) by mouth 4 (four) times daily. Max: 4/day    Dispense:  28 tablet    Refill:  0    This is an opioid taper. Provide patient with medication in the exact order and day as requested. Patient may have prescription filled one day early if pharmacy is closed on scheduled refill date. Do not fill until: 03/17/16 To last until: 03/24/16  . oxyCODONE (OXY IR/ROXICODONE) 5 MG immediate release tablet    Sig: Take 1 tablet (5 mg total) by mouth 3 (three) times daily. Max: 3/day    Dispense:  21 tablet    Refill:  0    This is an opioid taper. Provide patient with medication in the exact order and day as requested. Patient may have prescription filled one day early if pharmacy is closed on scheduled refill date. Do not fill until: 03/24/16 To last until: 03/31/16  . oxyCODONE (OXY IR/ROXICODONE) 5 MG immediate release tablet    Sig: Take 1 tablet (5 mg total) by mouth 2 (two) times daily. Max: 2/day    Dispense:  14 tablet    Refill:  0    This is an opioid taper. Provide patient with medication in the exact order and day as requested. Patient may have prescription filled one day early if pharmacy is closed on scheduled refill date. Do not fill until: 03/31/16 To last until: 04/07/16  . oxyCODONE (OXY IR/ROXICODONE) 5 MG immediate release tablet    Sig: Take 1 tablet (5 mg total) by mouth daily. Max: 1/day    Dispense:  7 tablet    Refill:  0    This is an opioid taper. Provide patient with medication in the exact order and day as requested. Patient may  have prescription filled one day early if pharmacy is closed on scheduled refill date. Do not fill until: 04/07/16 To last until: 04/14/16  . oxyCODONE (OXY IR/ROXICODONE) 5 MG immediate release tablet    Sig: Take 1 tablet (5 mg total) by mouth 5 (five) times daily. Max: 5/day  Dispense:  35 tablet    Refill:  0    This is an opioid taper. Provide patient with medication in the exact order and day as requested. Patient may have prescription filled one day early if pharmacy is closed on scheduled refill date. Do not fill until: 03/10/16 To last until: 03/17/16   New Prescriptions   OXYCODONE (OXY IR/ROXICODONE) 5 MG IMMEDIATE RELEASE TABLET    Take 1 tablet (5 mg total) by mouth 4 (four) times daily. Max: 4/day   OXYCODONE (OXY IR/ROXICODONE) 5 MG IMMEDIATE RELEASE TABLET    Take 1 tablet (5 mg total) by mouth 3 (three) times daily. Max: 3/day   OXYCODONE (OXY IR/ROXICODONE) 5 MG IMMEDIATE RELEASE TABLET    Take 1 tablet (5 mg total) by mouth 2 (two) times daily. Max: 2/day   OXYCODONE (OXY IR/ROXICODONE) 5 MG IMMEDIATE RELEASE TABLET    Take 1 tablet (5 mg total) by mouth daily. Max: 1/day   OXYCODONE (OXY IR/ROXICODONE) 5 MG IMMEDIATE RELEASE TABLET    Take 1 tablet (5 mg total) by mouth 5 (five) times daily. Max: 5/day   Medications administered today: We administered orphenadrine. Lab-work, procedure(s), and/or referral(s): Orders Placed This Encounter  Procedures  . 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   Considering:   Diagnostic bilateral lumbar facet block Possible bilateral lumbar facet radiofrequency ablation Diagnostic bilateral cervical facet block Diagnostic L1-2 lumbar epidural steroid injection Diagnostic, right-sided L4-5 lumbar epidural steroid injection Diagnostic bilateral intra-articular shoulder joint  injection Diagnostic bilateral suprascapular nerve block Possible bilateral suprascapular nerve radiofrequency ablation Palliative left-sided L5-S1 lumbar epidural steroid injection  Diagnostic left S1 selective nerve root block Diagnostic right-sided cervical epidural steroid injection Diagnostic T10-11 thoracic epidural steroid injection Diagnostic bilateral intra-articular hip joint injection Possible bilateral hip radiofrequency ablation   Palliative PRN treatment(s):   Diagnostic bilateral lumbar facet block Diagnostic bilateral cervical facet block Diagnostic L1-2 lumbar epidural steroid injection Diagnostic, right-sided L4-5 lumbar epidural steroid injection Diagnostic bilateral intra-articular shoulder joint injection Diagnostic bilateral suprascapular nerve block Palliative left-sided L5-S1 lumbar epidural steroid injection  Diagnostic left S1 selective nerve root block Diagnostic right-sided cervical epidural steroid injection Diagnostic T10-11 thoracic epidural steroid injection  Diagnostic bilateral intra-articular hip joint injection   Provider-requested follow-up: Return in about 1 month (around 04/10/2016) for procedure (ASAA), (MD) Med-Mgmt.  Future Appointments Date Time Provider [place]  03/14/2016 10:00 AM Milinda Pointer, MD [facility] None  04/21/2016 10:15 AM Milinda Pointer, MD [facility] None   Primary Care Physician: Ardine Eng, MD Location: [facility] Note by: Kathlen Brunswick. Dossie Arbour, M.D, DABA, DABAPM, DABPM, DABIPP, FIPP Date: 03/10/2016; Time: 8:17 AM  Pain Score Disclaimer: We use the NRS-11 scale. This is a self-reported, subjective measurement of pain severity with only modest accuracy. It is used primarily to identify changes within a particular patient. It must be understood that outpatient pain scales are significantly less accurate that those used for research, where they can be applied under ideal controlled  circumstances with minimal exposure to variables. In reality, the score is likely to be a combination of pain intensity and pain affect, where pain affect describes the degree of emotional arousal or changes in action readiness caused by the sensory experience of pain. Factors such as social and work situation, setting, emotional state, anxiety levels, expectation, and prior pain experience may influence pain perception and  show large inter-individual differences that may also be affected by time variables.  Patient instructions provided during this appointment: Patient Instructions   Pain Score  Introduction: The pain score used by this practice is the Verbal Numerical Rating Scale (VNRS-11). This is an 11-point scale. It is for adults and children 10 years or older. There are significant differences in how the pain score is reported, used, and applied. Forget everything you learned in the past and learn this scoring system.  General Information: The scale should reflect your current level of pain. Unless you are specifically asked for the level of your worst pain, or your average pain. If you are asked for one of these two, then it should be understood that it is over the past 24 hours.  Basic Activities of Daily Living (ADL): Personal hygiene, dressing, eating, transferring, and using restroom.  Instructions: Most patients tend to report their level of pain as a combination of two factors, their physical pain and their psychosocial pain. This last one is also known as "suffering" and it is reflection of how physical pain affects you socially and psychologically. From now on, report them separately. From this point on, when asked to report your pain level, report only your physical pain. Use the following table for reference.  Pain Clinic Pain Levels (0-5/10)  Pain Level Score Description  No Pain 0   Mild pain 1 Nagging, annoying, but does not interfere with basic activities of daily living  (ADL). Patients are able to eat, bathe, get dressed, toileting (being able to get on and off the toilet and perform personal hygiene functions), transfer (move in and out of bed or a chair without assistance), and maintain continence (able to control bladder and bowel functions). Blood pressure and heart rate are unaffected. A normal heart rate for a healthy adult ranges from 60 to 100 bpm (beats per minute).   Mild to moderate pain 2 Noticeable and distracting. Impossible to hide from other people. More frequent flare-ups. Still possible to adapt and function close to normal. It can be very annoying and may have occasional stronger flare-ups. With discipline, patients may get used to it and adapt.   Moderate pain 3 Interferes significantly with activities of daily living (ADL). It becomes difficult to feed, bathe, get dressed, get on and off the toilet or to perform personal hygiene functions. Difficult to get in and out of bed or a chair without assistance. Very distracting. With effort, it can be ignored when deeply involved in activities.   Moderately severe pain 4 Impossible to ignore for more than a few minutes. With effort, patients may still be able to manage work or participate in some social activities. Very difficult to concentrate. Signs of autonomic nervous system discharge are evident: dilated pupils (mydriasis); mild sweating (diaphoresis); sleep interference. Heart rate becomes elevated (>115 bpm). Diastolic blood pressure (lower number) rises above 100 mmHg. Patients find relief in laying down and not moving.   Severe pain 5 Intense and extremely unpleasant. Associated with frowning face and frequent crying. Pain overwhelms the senses.  Ability to do any activity or maintain social relationships becomes significantly limited. Conversation becomes difficult. Pacing back and forth is common, as getting into a comfortable position is nearly impossible. Pain wakes you up from deep sleep.  Physical signs will be obvious: pupillary dilation; increased sweating; goosebumps; brisk reflexes; cold, clammy hands and feet; nausea, vomiting or dry heaves; loss of appetite; significant sleep disturbance with inability to fall asleep or to  remain asleep. When persistent, significant weight loss is observed due to the complete loss of appetite and sleep deprivation.  Blood pressure and heart rate becomes significantly elevated. Caution: If elevated blood pressure triggers a pounding headache associated with blurred vision, then the patient should immediately seek attention at an urgent or emergency care unit, as these may be signs of an impending stroke.    Emergency Department Pain Levels (6-10/10)  Emergency Room Pain 6 Severely limiting. Requires emergency care and should not be seen or managed at an outpatient pain management facility. Communication becomes difficult and requires great effort. Assistance to reach the emergency department may be required. Facial flushing and profuse sweating along with potentially dangerous increases in heart rate and blood pressure will be evident.   Distressing pain 7 Self-care is very difficult. Assistance is required to transport, or use restroom. Assistance to reach the emergency department will be required. Tasks requiring coordination, such as bathing and getting dressed become very difficult.   Disabling pain 8 Self-care is no longer possible. At this level, pain is disabling. The individual is unable to do even the most "basic" activities such as walking, eating, bathing, dressing, transferring to a bed, or toileting. Fine motor skills are lost. It is difficult to think clearly.   Incapacitating pain 9 Pain becomes incapacitating. Thought processing is no longer possible. Difficult to remember your own name. Control of movement and coordination are lost.   The worst pain imaginable 10 At this level, most patients pass out from pain. When this level is  reached, collapse of the autonomic nervous system occurs, leading to a sudden drop in blood pressure and heart rate. This in turn results in a temporary and dramatic drop in blood flow to the brain, leading to a loss of consciousness. Fainting is one of the body's self defense mechanisms. Passing out puts the brain in a calmed state and causes it to shut down for a while, in order to begin the healing process.    Summary: 1. Refer to this scale when providing [place] with your pain level. 2. Be accurate and careful when reporting your pain level. This will help with your care. 3. Over-reporting your pain level will lead to loss of credibility. 4. Even a level of 1/10 means that there is pain and will be treated at our facility. 5. High, inaccurate reporting will be documented as "Symptom Exaggeration", leading to loss of credibility and suspicions of possible secondary gains such as obtaining more narcotics, or wanting to appear disabled, for fraudulent reasons. 6. Only pain levels of 5 or below will be seen at our facility. 7. Pain levels of 6 and above will be sent to the Emergency Department and the appointment cancelled. _____________________________________________________________________________________________  GENERAL RISKS AND COMPLICATIONS  What are the risk, side effects and possible complications? Generally speaking, most procedures are safe.  However, with any procedure there are risks, side effects, and the possibility of complications.  The risks and complications are dependent upon the sites that are lesioned, or the type of nerve block to be performed.  The closer the procedure is to the spine, the more serious the risks are.  Great care is taken when placing the radio frequency needles, block needles or lesioning probes, but sometimes complications can occur. 1. Infection: Any time there is an injection through the skin, there is a risk of infection.  This is why sterile conditions are  used for these blocks.  There are four possible types of infection.  1. Localized skin infection. 2. Central Nervous System Infection-This can be in the form of Meningitis, which can be deadly. 3. Epidural Infections-This can be in the form of an epidural abscess, which can cause pressure inside of the spine, causing compression of the spinal cord with subsequent paralysis. This would require an emergency surgery to decompress, and there are no guarantees that the patient would recover from the paralysis. 4. Discitis-This is an infection of the intervertebral discs.  It occurs in about 1% of discography procedures.  It is difficult to treat and it may lead to surgery.        2. Pain: the needles have to go through skin and soft tissues, will cause soreness.       3. Damage to internal structures:  The nerves to be lesioned may be near blood vessels or    other nerves which can be potentially damaged.       4. Bleeding: Bleeding is more common if the patient is taking blood thinners such as  aspirin, Coumadin, Ticiid, Plavix, etc., or if he/she have some genetic predisposition  such as hemophilia. Bleeding into the spinal canal can cause compression of the spinal  cord with subsequent paralysis.  This would require an emergency surgery to  decompress and there are no guarantees that the patient would recover from the  paralysis.       5. Pneumothorax:  Puncturing of a lung is a possibility, every time a needle is introduced in  the area of the chest or upper back.  Pneumothorax refers to free air around the  collapsed lung(s), inside of the thoracic cavity (chest cavity).  Another two possible  complications related to a similar event would include: Hemothorax and Chylothorax.   These are variations of the Pneumothorax, where instead of air around the collapsed  lung(s), you may have blood or chyle, respectively.       6. Spinal headaches: They may occur with any procedures in the area of the spine.        7. Persistent CSF (Cerebro-Spinal Fluid) leakage: This is a rare problem, but may occur  with prolonged intrathecal or epidural catheters either due to the formation of a fistulous  track or a dural tear.       8. Nerve damage: By working so close to the spinal cord, there is always a possibility of  nerve damage, which could be as serious as a permanent spinal cord injury with  paralysis.       9. Death:  Although rare, severe deadly allergic reactions known as "Anaphylactic  reaction" can occur to any of the medications used.      10. Worsening of the symptoms:  We can always make thing worse.  What are the chances of something like this happening? Chances of any of this occuring are extremely low.  By statistics, you have more of a chance of getting killed in a motor vehicle accident: while driving to the hospital than any of the above occurring .  Nevertheless, you should be aware that they are possibilities.  In general, it is similar to taking a shower.  Everybody knows that you can slip, hit your head and get killed.  Does that mean that you should not shower again?  Nevertheless always keep in mind that statistics do not mean anything if you happen to be on the wrong side of them.  Even if a procedure has a 1 (one) in a 1,000,000 (million) chance of  going wrong, it you happen to be that one..Also, keep in mind that by statistics, you have more of a chance of having something go wrong when taking medications.  Who should not have this procedure? If you are on a blood thinning medication (e.g. Coumadin, Plavix, see list of "Blood Thinners"), or if you have an active infection going on, you should not have the procedure.  If you are taking any blood thinners, please inform your physician.  How should I prepare for this procedure?  Do not eat or drink anything at least six hours prior to the procedure.  Bring a driver with you .  It cannot be a taxi.  Come accompanied by an adult that can drive  you back, and that is strong enough to help you if your legs get weak or numb from the local anesthetic.  Take all of your medicines the morning of the procedure with just enough water to swallow them.  If you have diabetes, make sure that you are scheduled to have your procedure done first thing in the morning, whenever possible.  If you have diabetes, take only half of your insulin dose and notify our nurse that you have done so as soon as you arrive at the clinic.  If you are diabetic, but only take blood sugar pills (oral hypoglycemic), then do not take them on the morning of your procedure.  You may take them after you have had the procedure.  Do not take aspirin or any aspirin-containing medications, at least eleven (11) days prior to the procedure.  They may prolong bleeding.  Wear loose fitting clothing that may be easy to take off and that you would not mind if it got stained with Betadine or blood.  Do not wear any jewelry or perfume  Remove any nail coloring.  It will interfere with some of our monitoring equipment.  NOTE: Remember that this is not meant to be interpreted as a complete list of all possible complications.  Unforeseen problems may occur.  BLOOD THINNERS The following drugs contain aspirin or other products, which can cause increased bleeding during surgery and should not be taken for 2 weeks prior to and 1 week after surgery.  If you should need take something for relief of minor pain, you may take acetaminophen which is found in Tylenol,m Datril, Anacin-3 and Panadol. It is not blood thinner. The products listed below are.  Do not take any of the products listed below in addition to any listed on your instruction sheet.  A.P.C or A.P.C with Codeine Codeine Phosphate Capsules #3 Ibuprofen Ridaura  ABC compound Congesprin Imuran rimadil  Advil Cope Indocin Robaxisal  Alka-Seltzer Effervescent Pain Reliever and Antacid Coricidin or Coricidin-D  Indomethacin Rufen   Alka-Seltzer plus Cold Medicine Cosprin Ketoprofen S-A-C Tablets  Anacin Analgesic Tablets or Capsules Coumadin Korlgesic Salflex  Anacin Extra Strength Analgesic tablets or capsules CP-2 Tablets Lanoril Salicylate  Anaprox Cuprimine Capsules Levenox Salocol  Anexsia-D Dalteparin Magan Salsalate  Anodynos Darvon compound Magnesium Salicylate Sine-off  Ansaid Dasin Capsules Magsal Sodium Salicylate  Anturane Depen Capsules Marnal Soma  APF Arthritis pain formula Dewitt's Pills Measurin Stanback  Argesic Dia-Gesic Meclofenamic Sulfinpyrazone  Arthritis Bayer Timed Release Aspirin Diclofenac Meclomen Sulindac  Arthritis pain formula Anacin Dicumarol Medipren Supac  Analgesic (Safety coated) Arthralgen Diffunasal Mefanamic Suprofen  Arthritis Strength Bufferin Dihydrocodeine Mepro Compound Suprol  Arthropan liquid Dopirydamole Methcarbomol with Aspirin Synalgos  ASA tablets/Enseals Disalcid Micrainin Tagament  Ascriptin Doan's Midol Talwin  Ascriptin  A/D Dolene Mobidin Tanderil  Ascriptin Extra Strength Dolobid Moblgesic Ticlid  Ascriptin with Codeine Doloprin or Doloprin with Codeine Momentum Tolectin  Asperbuf Duoprin Mono-gesic Trendar  Aspergum Duradyne Motrin or Motrin IB Triminicin  Aspirin plain, buffered or enteric coated Durasal Myochrisine Trigesic  Aspirin Suppositories Easprin Nalfon Trillsate  Aspirin with Codeine Ecotrin Regular or Extra Strength Naprosyn Uracel  Atromid-S Efficin Naproxen Ursinus  Auranofin Capsules Elmiron Neocylate Vanquish  Axotal Emagrin Norgesic Verin  Azathioprine Empirin or Empirin with Codeine Normiflo Vitamin E  Azolid Emprazil Nuprin Voltaren  Bayer Aspirin plain, buffered or children's or timed BC Tablets or powders Encaprin Orgaran Warfarin Sodium  Buff-a-Comp Enoxaparin Orudis Zorpin  Buff-a-Comp with Codeine Equegesic Os-Cal-Gesic   Buffaprin Excedrin plain, buffered or Extra Strength Oxalid   Bufferin Arthritis Strength Feldene  Oxphenbutazone   Bufferin plain or Extra Strength Feldene Capsules Oxycodone with Aspirin   Bufferin with Codeine Fenoprofen Fenoprofen Pabalate or Pabalate-SF   Buffets II Flogesic Panagesic   Buffinol plain or Extra Strength Florinal or Florinal with Codeine Panwarfarin   Buf-Tabs Flurbiprofen Penicillamine   Butalbital Compound Four-way cold tablets Penicillin   Butazolidin Fragmin Pepto-Bismol   Carbenicillin Geminisyn Percodan   Carna Arthritis Reliever Geopen Persantine   Carprofen Gold's salt Persistin   Chloramphenicol Goody's Phenylbutazone   Chloromycetin Haltrain Piroxlcam   Clmetidine heparin Plaquenil   Cllnoril Hyco-pap Ponstel   Clofibrate Hydroxy chloroquine Propoxyphen         Before stopping any of these medications, be sure to consult the physician who ordered them.  Some, such as Coumadin (Warfarin) are ordered to prevent or treat serious conditions such as "deep thrombosis", "pumonary embolisms", and other heart problems.  The amount of time that you may need off of the medication may also vary with the medication and the reason for which you were taking it.  If you are taking any of these medications, please make sure you notify your pain physician before you undergo any procedures.         Epidural Steroid Injection Patient Information  Description: The epidural space surrounds the nerves as they exit the spinal cord.  In some patients, the nerves can be compressed and inflamed by a bulging disc or a tight spinal canal (spinal stenosis).  By injecting steroids into the epidural space, we can bring irritated nerves into direct contact with a potentially helpful medication.  These steroids act directly on the irritated nerves and can reduce swelling and inflammation which often leads to decreased pain.  Epidural steroids may be injected anywhere along the spine and from the neck to the low back depending upon the location of your pain.   After numbing the skin with  local anesthetic (like Novocaine), a small needle is passed into the epidural space slowly.  You may experience a sensation of pressure while this is being done.  The entire block usually last less than 10 minutes.  Conditions which may be treated by epidural steroids:   Low back and leg pain  Neck and arm pain  Spinal stenosis  Post-laminectomy syndrome  Herpes zoster (shingles) pain  Pain from compression fractures  Preparation for the injection:  1. Do not eat any solid food or dairy products within 8 hours of your appointment.  2. You may drink clear liquids up to 3 hours before appointment.  Clear liquids include water, black coffee, juice or soda.  No milk or cream please. 3. You may take your regular medication, including pain medications, with a  sip of water before your appointment  Diabetics should hold regular insulin (if taken separately) and take 1/2 normal NPH dos the morning of the procedure.  Carry some sugar containing items with you to your appointment. 4. A driver must accompany you and be prepared to drive you home after your procedure.  5. Bring all your current medications with your. 6. An IV may be inserted and sedation may be given at the discretion of the physician.   7. A blood pressure cuff, EKG and other monitors will often be applied during the procedure.  Some patients may need to have extra oxygen administered for a short period. 8. You will be asked to provide medical information, including your allergies, prior to the procedure.  We must know immediately if you are taking blood thinners (like Coumadin/Warfarin)  Or if you are allergic to IV iodine contrast (dye). We must know if you could possible be pregnant.  Possible side-effects:  Bleeding from needle site  Infection (rare, may require surgery)  Nerve injury (rare)  Numbness & tingling (temporary)  Difficulty urinating (rare, temporary)  Spinal headache ( a headache worse with upright  posture)  Light -headedness (temporary)  Pain at injection site (several days)  Decreased blood pressure (temporary)  Weakness in arm/leg (temporary)  Pressure sensation in back/neck (temporary)  Call if you experience:  Fever/chills associated with headache or increased back/neck pain.  Headache worsened by an upright position.  New onset weakness or numbness of an extremity below the injection site  Hives or difficulty breathing (go to the emergency room)  Inflammation or drainage at the infection site  Severe back/neck pain  Any new symptoms which are concerning to you  Please note:  Although the local anesthetic injected can often make your back or neck feel good for several hours after the injection, the pain will likely return.  It takes 3-7 days for steroids to work in the epidural space.  You may not notice any pain relief for at least that one week.  If effective, we will often do a series of three injections spaced 3-6 weeks apart to maximally decrease your pain.  After the initial series, we generally will wait several months before considering a repeat injection of the same type.  If you have any questions, please call [phone] [facility]

## 2016-03-14 ENCOUNTER — Ambulatory Visit: Payer: Medicaid Other | Admitting: Pain Medicine

## 2016-03-16 NOTE — Progress Notes (Signed)
NOTE: This forensic urine drug screen (UDS) test was conducted using a state-of-the-art ultra high performance liquid chromatography and mass spectrometry system (UPLC/MS-MS), the most sophisticated and accurate method available. UPLC/MS-MS is 1,000 times more precise and accurate than standard gas chromatography and mass spectrometry (GC/MS). This system can analyze 26 drug categories and 180 drug compounds.  The findings of this UDT were reported as abnormal due to inconsistencies with expected results. An expected substance was not present in the sample. Expectations were based on the medication history provided by the patient at the time of sample collection. Results may suggest one of the following possibilities:  1). Possible opioid diversion. 2). Non-compliance with instructions on how to take the medication, including increase intake of medication early in the prescription refill, possibly running out towards the end of the prescribed period. 3). A scheduling issue where the patient was unable to come in before running out of medication. 4). PRN and/or low dose use of medication.

## 2016-03-21 ENCOUNTER — Telehealth: Payer: Self-pay

## 2016-03-21 NOTE — Telephone Encounter (Addendum)
Returned patient call.  Patient states she fell and has a softball size bruise on her lower left back.Martin Majestic to urgent care.  xrays were done.  Patient states she is having pain in her left hip and shooting down left leg.  Patient states this is a new pain and has came from the fall.  Informed patient to see what xrays were done and have results sent to her PCP and DR Dossie Arbour.  Informed patient that this is an acute injury and would need to be followed up with PCP.  Patient states she will go to urgent care and get information of what was done and have it sent to [place] and call us to let us know.

## 2016-03-21 NOTE — Telephone Encounter (Signed)
SPoke with Dr Dossie Arbour.  He states that we may bring the patient in for an appointment today if she calls back.  Shatoya notified.

## 2016-03-21 NOTE — Telephone Encounter (Signed)
Pt fell going around her bed pt has pain on her left hip that goes down her left leg. Pt would like to come in for an epidural that she was suppose to have that she never had

## 2016-03-21 NOTE — Telephone Encounter (Signed)
Front desk received a call from Lori Mckenzie in regards to [employer].  Attempted to call Lori Mckenzie back, but was directed to a voicemail.  No voicemail left.

## 2016-03-22 NOTE — Telephone Encounter (Signed)
Patient called back.  States she went to clinic yesterday.  Patient had a CT done on left side yesterday.  States doctor said he didn't see much but it would go to radiologist.  Patient requested results to be sent to Dr Dossie Arbour.  Patient had a injection of Dilaudid during examination yesterday in [place] clinic.  Patient continues to complain of pain in hip and leg.  Scheduled to come in for injection tomorrow at 915.  Pre procedure instructions given with teach back 3 done.  Patient will not take her insulin in the morning, but will bring her insulin pen with her to appointment.

## 2016-03-23 ENCOUNTER — Ambulatory Visit
Admission: RE | Admit: 2016-03-23 | Discharge: 2016-03-23 | Disposition: A | Discharge status: Home / Self Care | Payer: Medicaid Other | Source: Ambulatory Visit | Attending: Pain Medicine | Admitting: Pain Medicine

## 2016-03-23 ENCOUNTER — Ambulatory Visit (HOSPITAL_BASED_OUTPATIENT_CLINIC_OR_DEPARTMENT_OTHER): Payer: Medicaid Other | Admitting: Pain Medicine

## 2016-03-23 ENCOUNTER — Encounter: Payer: Self-pay | Admitting: Pain Medicine

## 2016-03-23 VITALS — BP 94/81 | HR 85 | Temp 98.0°F | Resp 16 | Ht 62.0 in | Wt 250.0 lb

## 2016-03-23 DIAGNOSIS — G8929 Other chronic pain: Secondary | ICD-10-CM | POA: Diagnosis not present

## 2016-03-23 DIAGNOSIS — M5442 Lumbago with sciatica, left side: Secondary | ICD-10-CM

## 2016-03-23 DIAGNOSIS — M47816 Spondylosis without myelopathy or radiculopathy, lumbar region: Secondary | ICD-10-CM | POA: Insufficient documentation

## 2016-03-23 DIAGNOSIS — Z9889 Other specified postprocedural states: Secondary | ICD-10-CM | POA: Insufficient documentation

## 2016-03-23 DIAGNOSIS — M150 Primary generalized (osteo)arthritis: Secondary | ICD-10-CM | POA: Insufficient documentation

## 2016-03-23 DIAGNOSIS — M5441 Lumbago with sciatica, right side: Secondary | ICD-10-CM | POA: Diagnosis not present

## 2016-03-23 DIAGNOSIS — Z9851 Tubal ligation status: Secondary | ICD-10-CM | POA: Insufficient documentation

## 2016-03-23 DIAGNOSIS — M533 Sacrococcygeal disorders, not elsewhere classified: Secondary | ICD-10-CM

## 2016-03-23 DIAGNOSIS — Z79899 Other long term (current) drug therapy: Secondary | ICD-10-CM | POA: Insufficient documentation

## 2016-03-23 DIAGNOSIS — M159 Polyosteoarthritis, unspecified: Secondary | ICD-10-CM

## 2016-03-23 DIAGNOSIS — M1288 Other specific arthropathies, not elsewhere classified, other specified site: Secondary | ICD-10-CM

## 2016-03-23 DIAGNOSIS — M8949 Other hypertrophic osteoarthropathy, multiple sites: Secondary | ICD-10-CM

## 2016-03-23 DIAGNOSIS — Z90710 Acquired absence of both cervix and uterus: Secondary | ICD-10-CM | POA: Insufficient documentation

## 2016-03-23 LAB — GLUCOSE, CAPILLARY: Glucose-Capillary: 171 mg/dL — ABNORMAL HIGH (ref 65–99)

## 2016-03-23 MED ORDER — ROPIVACAINE HCL 5 MG/ML IJ SOLN
INTRAMUSCULAR | Status: AC
  Filled 2016-03-23: qty 20

## 2016-03-23 MED ORDER — LIDOCAINE HCL (PF) 1 % IJ SOLN: 10.0000 mL | Freq: Once | INTRAMUSCULAR | Status: DC

## 2016-03-23 MED ORDER — TRIAMCINOLONE ACETONIDE 40 MG/ML IJ SUSP
INTRAMUSCULAR | Status: AC
  Filled 2016-03-23: qty 1

## 2016-03-23 MED ORDER — TRIAMCINOLONE ACETONIDE 40 MG/ML IJ SUSP
40.0000 mg | Freq: Once | INTRAMUSCULAR | Status: AC
  Administered 2016-03-23: 40 mg

## 2016-03-23 MED ORDER — LACTATED RINGERS IV SOLN
1000.0000 mL | Freq: Once | INTRAVENOUS | Status: AC
  Administered 2016-03-23: 1000 mL via INTRAVENOUS

## 2016-03-23 MED ORDER — FENTANYL CITRATE (PF) 100 MCG/2ML IJ SOLN
INTRAMUSCULAR | Status: AC
  Filled 2016-03-23: qty 2

## 2016-03-23 MED ORDER — ROPIVACAINE HCL 5 MG/ML IJ SOLN: 5.0000 mL | Freq: Once | INTRAMUSCULAR | Status: DC

## 2016-03-23 MED ORDER — SODIUM CHLORIDE 0.9 % IJ SOLN
INTRAMUSCULAR | Status: AC
  Filled 2016-03-23: qty 10

## 2016-03-23 MED ORDER — ROPIVACAINE HCL 5 MG/ML IJ SOLN
5.0000 mL | Freq: Once | INTRAMUSCULAR | Status: AC
  Administered 2016-03-23: 20 mL via PERINEURAL

## 2016-03-23 MED ORDER — MIDAZOLAM HCL 5 MG/5ML IJ SOLN
1.0000 mg | INTRAMUSCULAR | Status: DC | PRN
  Administered 2016-03-23: 4 mg via INTRAVENOUS

## 2016-03-23 MED ORDER — FENTANYL CITRATE (PF) 100 MCG/2ML IJ SOLN
25.0000 ug | INTRAMUSCULAR | Status: DC | PRN
Start: 2016-03-23 — End: 2016-03-23
  Administered 2016-03-23: 100 ug via INTRAVENOUS

## 2016-03-23 MED ORDER — MIDAZOLAM HCL 5 MG/5ML IJ SOLN
INTRAMUSCULAR | Status: AC
  Filled 2016-03-23: qty 5

## 2016-03-23 MED ORDER — LIDOCAINE HCL (PF) 1 % IJ SOLN
INTRAMUSCULAR | Status: AC
  Filled 2016-03-23: qty 10

## 2016-03-23 MED ORDER — METHYLPREDNISOLONE ACETATE 80 MG/ML IJ SUSP
80.0000 mg | Freq: Once | INTRAMUSCULAR | Status: AC
  Administered 2016-03-23: 80 mg via INTRA_ARTICULAR

## 2016-03-23 MED ORDER — LIDOCAINE HCL (PF) 1 % IJ SOLN
10.0000 mL | Freq: Once | INTRAMUSCULAR | Status: AC
  Administered 2016-03-23: 5 mL

## 2016-03-23 MED ORDER — METHYLPREDNISOLONE ACETATE 80 MG/ML IJ SUSP
INTRAMUSCULAR | Status: AC
  Filled 2016-03-23: qty 1

## 2016-03-23 NOTE — Progress Notes (Signed)
Patient's Name: Lori Mckenzie  MRN: [id number]  Referring Provider: Ardine Eng, MD  DOB: 1965/05/23  PCP: Ardine Eng, MD  DOS: 03/23/2016  Note by: Kathlen Brunswick. Dossie Arbour, MD  Service setting: Ambulatory outpatient  Location: [facility] (AMB) Pain Management Facility  Visit type: Procedure  Specialty: Interventional Pain Management  Patient type: Established   Primary Reason for Visit: Interventional Pain Management Treatment. CC: Back Pain (lower) and Tailbone Pain  Procedure:  Anesthesia, Analgesia, Anxiolysis:  Procedure #1: Type: Diagnostic Medial Branch Facet Block Region: Lumbar Level: L2, L3, L4, L5, & S1 Medial Branch Level(s) Laterality: Left  Procedure #2: Type: Diagnostic Sacroiliac Joint Block Region: Posterior Lumbosacral Level: PSIS (Posterior Superior Iliac Spine) Sacroiliac Joint Laterality: Left  Type: Local Anesthesia with Moderate (Conscious) Sedation Local Anesthetic: Lidocaine 1% Route: Intravenous (IV) IV Access: Secured Sedation: Meaningful verbal contact was maintained at all times during the procedure  Indication(s): Analgesia and Anxiety  Indications: 1. Chronic low back pain (Location of Primary Source of Pain) (Bilateral) (R>L)   2. Lumbar facet syndrome (Location of Primary Source of Pain) (Bilateral) (R>L)   3. Lumbar spondylosis   4. Osteoarthritis   5. Chronic left sacroiliac joint pain    Pain Score: Pre-procedure: 5 /10 Post-procedure: 0-No pain/10  Pre-op Assessment:  Previous date of service: 03/10/16 Service provided: Evaluation Lori Mckenzie is a 51 y.o. (year old), female patient, seen today for interventional treatment. She  has a past surgical history that includes Abdominal hysterectomy; Tubal ligation; Colonoscopy with propofol (N/A, 06/23/2014); Esophagogastroduodenoscopy (N/A, 06/23/2014); Cholecystectomy (N/A, 07/15/2014); ERCP (N/A, 07/16/2014); Wisdom tooth extraction; ERCP (N/A, 10/03/2014); and Esophagogastroduodenoscopy (egd) with propofol  (N/A, 12/01/2015). Her primarily concern today is the Back Pain (lower) and Tailbone Pain The patient indicates recently having experienced a fall as she was coming out of the hour where she slipped and hit her left abdominal area and back. A CT scan of her pelvis was negative for any hip fractures or any other pathology in the area. Upon examination today, she has bruising in her left lower quadrant with tenderness to palpation and in a similar manner she also has exquisite tenderness to palpation over the left lower portion of her back. She is demonstrating significant guarding including in her breathing. She comes in today to see if there is anything that we can do for her. Physical examination and history would suggest a flareup of her lumbar facet pain as well as pain in the area of the left sacroiliac joint with radiation toward her left groin and pain in the left lower quadrant, likely to be due to the direct impact to this area from the fall. Bruising was clearly seen in the left abdominal region as well has left flank area.  Initial Vital Signs: Blood pressure 112/81, pulse (!) 101, temperature 98 F (36.7 C), temperature source Oral, resp. rate 20, height 5' 2"  (1.575 m), weight 250 lb (113.4 kg), SpO2 98 %. BMI: 45.73 kg/m  Risk Assessment: Allergies: Reviewed. She is allergic to tape and vicodin [hydrocodone-acetaminophen].  Allergy Precautions: None required Coagulopathies: "Reviewed. None identified.  Blood-thinner therapy: None at this time Active Infection(s): Reviewed. None identified. Lori Mckenzie is afebrile  Site Confirmation: Lori Mckenzie was asked to confirm the procedure and laterality before marking the site Procedure checklist: Completed Consent: Before the procedure and under the influence of no sedative(s), amnesic(s), or anxiolytics, the patient was informed of the treatment options, risks and possible complications. To fulfill our ethical and legal obligations, as recommended  by the American Medical Association's Code of Ethics, I have informed the patient of my clinical impression; the nature and purpose of the treatment or procedure; the risks, benefits, and possible complications of the intervention; the alternatives, including doing nothing; the risk(s) and benefit(s) of the alternative treatment(s) or procedure(s); and the risk(s) and benefit(s) of doing nothing. The patient was provided information about the general risks and possible complications associated with the procedure. These may include, but are not limited to: failure to achieve desired goals, infection, bleeding, organ or nerve damage, allergic reactions, paralysis, and death. In addition, the patient was informed of those risks and complications associated to Spine-related procedures, such as failure to decrease pain; infection (i.e.: Meningitis, epidural or intraspinal abscess); bleeding (i.e.: epidural hematoma, subarachnoid hemorrhage, or any other type of intraspinal or peri-dural bleeding); organ or nerve damage (i.e.: Any type of peripheral nerve, nerve root, or spinal cord injury) with subsequent damage to sensory, motor, and/or autonomic systems, resulting in permanent pain, numbness, and/or weakness of one or several areas of the body; allergic reactions; (i.e.: anaphylactic reaction); and/or death. Furthermore, the patient was informed of those risks and complications associated with the medications. These include, but are not limited to: allergic reactions (i.e.: anaphylactic or anaphylactoid reaction(s)); adrenal axis suppression; blood sugar elevation that in diabetics may result in ketoacidosis or comma; water retention that in patients with history of congestive heart failure may result in shortness of breath, pulmonary edema, and decompensation with resultant heart failure; weight gain; swelling or edema; medication-induced neural toxicity; particulate matter embolism and blood vessel occlusion with  resultant organ, and/or nervous system infarction; and/or aseptic necrosis of one or more joints. Finally, the patient was informed that Medicine is not an exact science; therefore, there is also the possibility of unforeseen or unpredictable risks and/or possible complications that may result in a catastrophic outcome. The patient indicated having understood very clearly. We have given the patient no guarantees and we have made no promises. Enough time was given to the patient to ask questions, all of which were answered to the patient's satisfaction. Lori Mckenzie has indicated that she wanted to continue with the procedure. Attestation: I, the ordering provider, attest that I have discussed with the patient the benefits, risks, side-effects, alternatives, likelihood of achieving goals, and potential problems during recovery for the procedure that I have provided informed consent. Date: 03/23/2016; Time: 10:28 AM  Pre-Procedure Preparation:  Monitoring: As per clinic protocol. Respiration, ETCO2, SpO2, BP, heart rate and rhythm monitor placed and checked for adequate function Safety Precautions: Patient was assessed for positional comfort and pressure points before starting the procedure. Time-out: I initiated and conducted the "Time-out" before starting the procedure, as per protocol. The patient was asked to participate by confirming the accuracy of the "Time Out" information. Verification of the correct person, site, and procedure were performed and confirmed by me, the nursing staff, and the patient. "Time-out" conducted as per Joint Commission's Universal Protocol (UP.01.01.01). "Time-out" Date & Time: 03/23/2016; 1058 hrs.  Description of Procedure #1 Process:   Time-out: "Time-out" completed before starting procedure, as per protocol. Position: Prone Target Area: For Lumbar Facet blocks, the target is the groove formed by the junction of the transverse process and superior articular process. For the  L5 dorsal ramus, the target is the notch between superior articular process and sacral ala. For the S1 dorsal ramus, the target is the superior and lateral edge of the posterior S1 Sacral foramen. Approach: Paramedial approach. Area Prepped:  Entire Posterior Lumbosacral Region Prepping solution: ChloraPrep (2% chlorhexidine gluconate and 70% isopropyl alcohol) Safety Precautions: Aspiration looking for blood return was conducted prior to all injections. At no point did we inject any substances, as a needle was being advanced. No attempts were made at seeking any paresthesias. Safe injection practices and needle disposal techniques used. Medications properly checked for expiration dates. SDV (single dose vial) medications used.  Description of the Procedure: Protocol guidelines were followed. The patient was placed in position over the fluoroscopy table. The target area was identified and the area prepped in the usual manner. Skin desensitized using vapocoolant spray. Skin & deeper tissues infiltrated with local anesthetic. Appropriate amount of time allowed to pass for local anesthetics to take effect. The procedure needle was introduced through the skin, ipsilateral to the reported pain, and advanced to the target area. Employing the "Medial Branch Technique", the needles were advanced to the angle made by the superior and medial portion of the transverse process, and the lateral and inferior portion of the superior articulating process of the targeted vertebral bodies. This area is known as "Burton's Eye" or the "Eye of the [place] Dog". A procedure needle was introduced through the skin, and this time advanced to the angle made by the superior and medial border of the sacral ala, and the lateral border of the S1 vertebral body. This last needle was later repositioned at the superior and lateral border of the posterior S1 foramen. Negative aspiration confirmed. Solution injected in intermittent fashion,  asking for systemic symptoms every 0.5cc of injectate. The needles were then removed and the area cleansed, making sure to leave some of the prepping solution back to take advantage of its long term bactericidal properties. Start Time: 1058 hrs. Materials:  Needle(s) Type: Regular needle Gauge: 22G Length: 3.5-in Medication(s): We administered fentaNYL, lactated ringers, midazolam, methylPREDNISolone acetate, triamcinolone acetonide, lidocaine (PF), and ropivacaine (PF) 5 mg/mL (0.5%). Please see chart orders for dosing details.  Description of Procedure # 2 Process:   Position: Prone Target Area: For upper sacroiliac joint block(s), the target is the superior and posterior margin of the sacroiliac joint. Approach: Ipsilateral approach. Area Prepped: Entire Posterior Lumbosacral Region Prepping solution: ChloraPrep (2% chlorhexidine gluconate and 70% isopropyl alcohol) Safety Precautions: Aspiration looking for blood return was conducted prior to all injections. At no point did we inject any substances, as a needle was being advanced. No attempts were made at seeking any paresthesias. Safe injection practices and needle disposal techniques used. Medications properly checked for expiration dates. SDV (single dose vial) medications used. Description of the Procedure: Protocol guidelines were followed. The patient was placed in position over the fluoroscopy table. The target area was identified and the area prepped in the usual manner. Skin desensitized using vapocoolant spray. Skin & deeper tissues infiltrated with local anesthetic. Appropriate amount of time allowed to pass for local anesthetics to take effect. The procedure needle was advanced under fluoroscopic guidance into the sacroiliac joint until a firm endpoint was obtained. Proper needle placement secured. Negative aspiration confirmed. Solution injected in intermittent fashion, asking for systemic symptoms every 0.5cc of injectate. The  needles were then removed and the area cleansed, making sure to leave some of the prepping solution back to take advantage of its long term bactericidal properties. Vitals:   03/23/16 1110 03/23/16 1121 03/23/16 1131 03/23/16 1141  BP: 113/84 115/81 109/73 94/81  Pulse: 90 85 83 85  Resp: 18 16 16 16   Temp:  TempSrc:      SpO2: 94% 96% 97% 96%  Weight:      Height:        End Time: 1107 hrs. Materials:  Needle(s) Type: Regular needle Gauge: 22G Length: 3.5-in Medication(s): We administered fentaNYL, lactated ringers, midazolam, methylPREDNISolone acetate, triamcinolone acetonide, lidocaine (PF), and ropivacaine (PF) 5 mg/mL (0.5%). Please see chart orders for dosing details.  Imaging Guidance (Spinal):  Type of Imaging Technique: Fluoroscopy Guidance (Spinal) Indication(s): Assistance in needle guidance and placement for procedures requiring needle placement in or near specific anatomical locations not easily accessible without such assistance. Exposure Time: Please see nurses notes. Contrast: None used. Fluoroscopic Guidance: I was personally present during the use of fluoroscopy. "Tunnel Vision Technique" used to obtain the best possible view of the target area. Parallax error corrected before commencing the procedure. "Direction-depth-direction" technique used to introduce the needle under continuous pulsed fluoroscopy. Once target was reached, antero-posterior, oblique, and lateral fluoroscopic projection used confirm needle placement in all planes. Images permanently stored in EMR. Interpretation: No contrast injected. I personally interpreted the imaging intraoperatively. Adequate needle placement confirmed in multiple planes. Permanent images saved into the patient's record.  Antibiotic Prophylaxis:  Indication(s): None identified Antibiotic given: None  Post-operative Assessment:  EBL: None Complications: No immediate post-treatment complications observed by team, or  reported by patient. Note: The patient tolerated the entire procedure well. A repeat set of vitals were taken after the procedure and the patient was kept under observation following institutional policy, for this type of procedure. Post-procedural neurological assessment was performed, showing return to baseline, prior to discharge. The patient was provided with post-procedure discharge instructions, including a section on how to identify potential problems. Should any problems arise concerning this procedure, the patient was given instructions to immediately contact us, at any time, without hesitation. In any case, we plan to contact the patient by telephone for a follow-up status report regarding this interventional procedure. Comments:  No additional relevant information.  Plan of Care  Disposition: Discharge home  Discharge Date & Time: 03/23/2016; 1149 hrs.  Physician-requested Follow-up:  Return in about 4 weeks (around 04/21/2016) for Post-Procedure evaluation.  Future Appointments Date Time Provider [place]  04/21/2016 10:15 AM Milinda Pointer, MD [facility] None   Medications ordered for procedure: Meds ordered this encounter  Medications  . fentaNYL (SUBLIMAZE) injection 25-50 mcg    Make sure Narcan is available in the pyxis when using this medication. In the event of respiratory depression (RR< 8/min): Titrate NARCAN (naloxone) in increments of 0.1 to 0.2 mg IV at 2-3 minute intervals, until desired degree of reversal.  . lactated ringers infusion 1,000 mL  . midazolam (VERSED) 5 MG/5ML injection 1-2 mg    Make sure Flumazenil is available in the pyxis when using this medication. If oversedation occurs, administer 0.2 mg IV over 15 sec. If after 45 sec no response, administer 0.2 mg again over 1 min; may repeat at 1 min intervals; not to exceed 4 doses (1 mg)  . methylPREDNISolone acetate (DEPO-MEDROL) injection 80 mg  . lidocaine (PF) (XYLOCAINE) 1 % injection 10 mL  .  ropivacaine (PF) 5 mg/mL (0.5%) (NAROPIN) injection 5 mL  . triamcinolone acetonide (KENALOG-40) injection 40 mg  . lidocaine (PF) (XYLOCAINE) 1 % injection 10 mL  . ropivacaine (PF) 5 mg/mL (0.5%) (NAROPIN) injection 5 mL    Preservative-free (MPF), single use vial.   Medications administered: We administered fentaNYL, lactated ringers, midazolam, methylPREDNISolone acetate, triamcinolone acetonide, lidocaine (PF), and ropivacaine (PF)  5 mg/mL (0.5%).  See the medical record for exact dosing, route, and time of administration.  Lab-work, Procedure(s), & Referral(s) Ordered: Orders Placed This Encounter  Procedures  . LUMBAR FACET(MEDIAL BRANCH NERVE BLOCK) MBNB  . SACROILIAC JOINT INJECTINS  . DG C-Arm 1-60 Min-No Report  . Glucose, capillary  . Discharge instructions  . Follow-up  . Informed Consent Details: Transcribe to consent form and obtain patient signature  . Provider attestation of informed consent for procedure/surgical case  . Verify informed consent   Imaging Ordered: No results found for this or any previous visit. New Prescriptions   No medications on file   Primary Care Physician: Ardine Eng, MD Location: [facility] Note by: Kathlen Brunswick. Dossie Arbour, M.D, DABA, DABAPM, DABPM, DABIPP, FIPP Date: 03/23/2016; Time: 6:06 PM  Disclaimer:  Medicine is not an Chief Strategy Officer. The only guarantee in medicine is that nothing is guaranteed. It is important to note that the decision to proceed with this intervention was based on the information collected from the patient. The Data and conclusions were drawn from the patient's questionnaire, the interview, and the physical examination. Because the information was provided in large part by the patient, it cannot be guaranteed that it has not been purposely or unconsciously manipulated. Every effort has been made to obtain as much relevant data as possible for this evaluation. It is important to note that  the conclusions that lead to this procedure are derived in large part from the available data. Always take into account that the treatment will also be dependent on availability of resources and existing treatment guidelines, considered by other Pain Management Practitioners as being common knowledge and practice, at the time of the intervention. For Medico-Legal purposes, it is also important to point out that variation in procedural techniques and pharmacological choices are the acceptable norm. The indications, contraindications, technique, and results of the above procedure should only be interpreted and judged by a Board-Certified Interventional Pain Specialist with extensive familiarity and expertise in the same exact procedure and technique. Attempts at providing opinions without similar or greater experience and expertise than that of the treating physician will be considered as inappropriate and unethical, and shall result in a formal complaint to the state medical board and applicable specialty societies.  Instructions provided at this appointment: Patient Instructions  Post-Procedure instructions Instructions:  Apply ice: Fill a plastic sandwich bag with crushed ice. Cover it with a small towel and apply to injection site. Apply for 15 minutes then remove x 15 minutes. Repeat sequence on day of procedure, until you go to bed. The purpose is to minimize swelling and discomfort after procedure.  Apply heat: Apply heat to procedure site starting the day following the procedure. The purpose is to treat any soreness and discomfort from the procedure.  Food intake: Start with clear liquids (like water) and advance to regular food, as tolerated.   Physical activities: Keep activities to a minimum for the first 8 hours after the procedure.   Driving: If you have received any sedation, you are not allowed to drive for 24 hours after your procedure.  Blood thinner: Restart your blood thinner 6  hours after your procedure. (Only for those taking blood thinners)  Insulin: As soon as you can eat, you may resume your normal dosing schedule. (Only for those taking insulin)  Infection prevention: Keep procedure site clean and dry.  Post-procedure Pain Diary: Extremely important that this be done correctly and accurately. Recorded information will  be used to determine the next step in treatment.  Pain evaluated is that of treated area only. Do not include pain from an untreated area.  Complete every hour, on the hour, for the initial 8 hours. Set an alarm to help you do this part accurately.  Do not go to sleep and have it completed later. It will not be accurate.  Follow-up appointment: Keep your follow-up appointment after the procedure. Usually 2 weeks for most procedures. (6 weeks in the case of radiofrequency.) Bring you pain diary.  Expect:  From numbing medicine (AKA: Local Anesthetics): Numbness or decrease in pain.  Onset: Full effect within 15 minutes of injected.  Duration: It will depend on the type of local anesthetic used. On the average, 1 to 8 hours.   From steroids: Decrease in swelling or inflammation. Once inflammation is improved, relief of the pain will follow.  Onset of benefits: Depends on the amount of swelling present. The more swelling, the longer it will take for the benefits to be seen.   Duration: Steroids will stay in the system x 2 weeks. Duration of benefits will depend on multiple posibilities including persistent irritating factors.  From procedure: Some discomfort is to be expected once the numbing medicine wears off. This should be minimal if ice and heat are applied as instructed. Call if:  You experience numbness and weakness that gets worse with time, as opposed to wearing off.  New onset bowel or bladder incontinence. (Spinal procedures only)  Emergency Numbers:  Durning business hours (Monday - Thursday, 8:00 AM - 4:00 PM) (Friday,  9:00 AM - 12:00 Noon): [phone]  After hours: [phone]   __________________________________________________________________________________________   Pain Management Discharge Instructions  General Discharge Instructions :  If you need to reach your doctor call: Monday-Friday 8:00 am - 4:00 pm at [phone] or toll free [phone].  After clinic hours [phone] to have operator reach doctor.  Bring all of your medication bottles to all your appointments in the pain clinic.  To cancel or reschedule your appointment with Pain Management please remember to call 24 hours in advance to avoid a fee.  Refer to the educational materials which you have been given on: General Risks, I had my Procedure. Discharge Instructions, Post Sedation.  Post Procedure Instructions:  The drugs you were given will stay in your system until tomorrow, so for the next 24 hours you should not drive, make any legal decisions or drink any alcoholic beverages.  You may eat anything you prefer, but it is better to start with liquids then soups and crackers, and gradually work up to solid foods.  Please notify your doctor immediately if you have any unusual bleeding, trouble breathing or pain that is not related to your normal pain.  Depending on the type of procedure that was done, some parts of your body may feel week and/or numb.  This usually clears up by tonight or the next day.  Walk with the use of an assistive device or accompanied by an adult for the 24 hours.  You may use ice on the affected area for the first 24 hours.  Put ice in a Ziploc bag and cover with a towel and place against area 15 minutes on 15 minutes off.  You may switch to heat after 24 hours.

## 2016-03-23 NOTE — Progress Notes (Signed)
Safety precautions to be maintained throughout the outpatient stay will include: orient to surroundings, keep bed in low position, maintain call bell within reach at all times, provide assistance with transfer out of bed and ambulation.  

## 2016-03-23 NOTE — Patient Instructions (Addendum)
Post-Procedure instructions Instructions:  Apply ice: Fill a plastic sandwich bag with crushed ice. Cover it with a small towel and apply to injection site. Apply for 15 minutes then remove x 15 minutes. Repeat sequence on day of procedure, until you go to bed. The purpose is to minimize swelling and discomfort after procedure.  Apply heat: Apply heat to procedure site starting the day following the procedure. The purpose is to treat any soreness and discomfort from the procedure.  Food intake: Start with clear liquids (like water) and advance to regular food, as tolerated.   Physical activities: Keep activities to a minimum for the first 8 hours after the procedure.   Driving: If you have received any sedation, you are not allowed to drive for 24 hours after your procedure.  Blood thinner: Restart your blood thinner 6 hours after your procedure. (Only for those taking blood thinners)  Insulin: As soon as you can eat, you may resume your normal dosing schedule. (Only for those taking insulin)  Infection prevention: Keep procedure site clean and dry.  Post-procedure Pain Diary: Extremely important that this be done correctly and accurately. Recorded information will be used to determine the next step in treatment.  Pain evaluated is that of treated area only. Do not include pain from an untreated area.  Complete every hour, on the hour, for the initial 8 hours. Set an alarm to help you do this part accurately.  Do not go to sleep and have it completed later. It will not be accurate.  Follow-up appointment: Keep your follow-up appointment after the procedure. Usually 2 weeks for most procedures. (6 weeks in the case of radiofrequency.) Bring you pain diary.  Expect:  From numbing medicine (AKA: Local Anesthetics): Numbness or decrease in pain.  Onset: Full effect within 15 minutes of injected.  Duration: It will depend on the type of local anesthetic used. On the average, 1 to 8  hours.   From steroids: Decrease in swelling or inflammation. Once inflammation is improved, relief of the pain will follow.  Onset of benefits: Depends on the amount of swelling present. The more swelling, the longer it will take for the benefits to be seen.   Duration: Steroids will stay in the system x 2 weeks. Duration of benefits will depend on multiple posibilities including persistent irritating factors.  From procedure: Some discomfort is to be expected once the numbing medicine wears off. This should be minimal if ice and heat are applied as instructed. Call if:  You experience numbness and weakness that gets worse with time, as opposed to wearing off.  New onset bowel or bladder incontinence. (Spinal procedures only)  Emergency Numbers:  Durning business hours (Monday - Thursday, 8:00 AM - 4:00 PM) (Friday, 9:00 AM - 12:00 Noon): [phone]  After hours: [phone]   __________________________________________________________________________________________   Pain Management Discharge Instructions  General Discharge Instructions :  If you need to reach your doctor call: Monday-Friday 8:00 am - 4:00 pm at [phone] or toll free [phone].  After clinic hours [phone] to have operator reach doctor.  Bring all of your medication bottles to all your appointments in the pain clinic.  To cancel or reschedule your appointment with Pain Management please remember to call 24 hours in advance to avoid a fee.  Refer to the educational materials which you have been given on: General Risks, I had my Procedure. Discharge Instructions, Post Sedation.  Post Procedure Instructions:  The drugs you were given will stay in  your system until tomorrow, so for the next 24 hours you should not drive, make any legal decisions or drink any alcoholic beverages.  You may eat anything you prefer, but it is better to start with liquids then soups and crackers, and gradually  work up to solid foods.  Please notify your doctor immediately if you have any unusual bleeding, trouble breathing or pain that is not related to your normal pain.  Depending on the type of procedure that was done, some parts of your body may feel week and/or numb.  This usually clears up by tonight or the next day.  Walk with the use of an assistive device or accompanied by an adult for the 24 hours.  You may use ice on the affected area for the first 24 hours.  Put ice in a Ziploc bag and cover with a towel and place against area 15 minutes on 15 minutes off.  You may switch to heat after 24 hours.

## 2016-03-24 ENCOUNTER — Telehealth: Payer: Self-pay | Admitting: *Deleted

## 2016-03-24 NOTE — Telephone Encounter (Signed)
Spoke with patient re; procedure on yesterday, states she feels a little stiff this morning but no other issues.  States that she will call back on Monday if things are not better.  Educated the patient that she needs to give it at least 10 days for steroids to do their job before we would make any further decisions.  Patient verbalizes u/o information.

## 2016-04-12 ENCOUNTER — Ambulatory Visit: Payer: Medicaid Other | Attending: Pain Medicine | Admitting: Pain Medicine

## 2016-04-12 ENCOUNTER — Other Ambulatory Visit
Admission: RE | Admit: 2016-04-12 | Discharge: 2016-04-12 | Disposition: A | Discharge status: Home / Self Care | Payer: Medicaid Other | Source: Ambulatory Visit | Attending: Pain Medicine | Admitting: Pain Medicine

## 2016-04-12 ENCOUNTER — Encounter: Payer: Self-pay | Admitting: Pain Medicine

## 2016-04-12 VITALS — BP 137/83 | HR 100 | Temp 97.9°F | Resp 16 | Ht 62.0 in | Wt 250.0 lb

## 2016-04-12 DIAGNOSIS — E1165 Type 2 diabetes mellitus with hyperglycemia: Secondary | ICD-10-CM

## 2016-04-12 DIAGNOSIS — R7982 Elevated C-reactive protein (CRP): Secondary | ICD-10-CM

## 2016-04-12 DIAGNOSIS — I129 Hypertensive chronic kidney disease with stage 1 through stage 4 chronic kidney disease, or unspecified chronic kidney disease: Secondary | ICD-10-CM | POA: Insufficient documentation

## 2016-04-12 DIAGNOSIS — N189 Chronic kidney disease, unspecified: Secondary | ICD-10-CM | POA: Insufficient documentation

## 2016-04-12 DIAGNOSIS — M1288 Other specific arthropathies, not elsewhere classified, other specified site: Secondary | ICD-10-CM

## 2016-04-12 DIAGNOSIS — E119 Type 2 diabetes mellitus without complications: Secondary | ICD-10-CM | POA: Insufficient documentation

## 2016-04-12 DIAGNOSIS — J449 Chronic obstructive pulmonary disease, unspecified: Secondary | ICD-10-CM | POA: Insufficient documentation

## 2016-04-12 DIAGNOSIS — M25511 Pain in right shoulder: Secondary | ICD-10-CM | POA: Diagnosis not present

## 2016-04-12 DIAGNOSIS — E538 Deficiency of other specified B group vitamins: Secondary | ICD-10-CM

## 2016-04-12 DIAGNOSIS — Z8673 Personal history of transient ischemic attack (TIA), and cerebral infarction without residual deficits: Secondary | ICD-10-CM | POA: Insufficient documentation

## 2016-04-12 DIAGNOSIS — M159 Polyosteoarthritis, unspecified: Secondary | ICD-10-CM

## 2016-04-12 DIAGNOSIS — M4722 Other spondylosis with radiculopathy, cervical region: Secondary | ICD-10-CM

## 2016-04-12 DIAGNOSIS — Z79891 Long term (current) use of opiate analgesic: Secondary | ICD-10-CM | POA: Diagnosis not present

## 2016-04-12 DIAGNOSIS — M549 Dorsalgia, unspecified: Secondary | ICD-10-CM | POA: Insufficient documentation

## 2016-04-12 DIAGNOSIS — Z90710 Acquired absence of both cervix and uterus: Secondary | ICD-10-CM | POA: Diagnosis not present

## 2016-04-12 DIAGNOSIS — M150 Primary generalized (osteo)arthritis: Secondary | ICD-10-CM

## 2016-04-12 DIAGNOSIS — K7460 Unspecified cirrhosis of liver: Secondary | ICD-10-CM | POA: Insufficient documentation

## 2016-04-12 DIAGNOSIS — M25512 Pain in left shoulder: Secondary | ICD-10-CM | POA: Diagnosis not present

## 2016-04-12 DIAGNOSIS — E559 Vitamin D deficiency, unspecified: Secondary | ICD-10-CM | POA: Diagnosis present

## 2016-04-12 DIAGNOSIS — M542 Cervicalgia: Secondary | ICD-10-CM | POA: Diagnosis not present

## 2016-04-12 DIAGNOSIS — M5441 Lumbago with sciatica, right side: Secondary | ICD-10-CM

## 2016-04-12 DIAGNOSIS — M5442 Lumbago with sciatica, left side: Secondary | ICD-10-CM

## 2016-04-12 DIAGNOSIS — M47816 Spondylosis without myelopathy or radiculopathy, lumbar region: Secondary | ICD-10-CM

## 2016-04-12 DIAGNOSIS — R700 Elevated erythrocyte sedimentation rate: Secondary | ICD-10-CM | POA: Insufficient documentation

## 2016-04-12 DIAGNOSIS — M8949 Other hypertrophic osteoarthropathy, multiple sites: Secondary | ICD-10-CM

## 2016-04-12 DIAGNOSIS — M5412 Radiculopathy, cervical region: Secondary | ICD-10-CM | POA: Diagnosis not present

## 2016-04-12 DIAGNOSIS — K219 Gastro-esophageal reflux disease without esophagitis: Secondary | ICD-10-CM | POA: Insufficient documentation

## 2016-04-12 DIAGNOSIS — Z9049 Acquired absence of other specified parts of digestive tract: Secondary | ICD-10-CM | POA: Diagnosis not present

## 2016-04-12 DIAGNOSIS — G894 Chronic pain syndrome: Secondary | ICD-10-CM | POA: Diagnosis not present

## 2016-04-12 DIAGNOSIS — Z5181 Encounter for therapeutic drug level monitoring: Secondary | ICD-10-CM | POA: Insufficient documentation

## 2016-04-12 DIAGNOSIS — F1190 Opioid use, unspecified, uncomplicated: Secondary | ICD-10-CM

## 2016-04-12 DIAGNOSIS — G8929 Other chronic pain: Secondary | ICD-10-CM

## 2016-04-12 DIAGNOSIS — K7581 Nonalcoholic steatohepatitis (NASH): Secondary | ICD-10-CM | POA: Diagnosis not present

## 2016-04-12 DIAGNOSIS — IMO0001 Reserved for inherently not codable concepts without codable children: Secondary | ICD-10-CM | POA: Insufficient documentation

## 2016-04-12 DIAGNOSIS — F419 Anxiety disorder, unspecified: Secondary | ICD-10-CM | POA: Insufficient documentation

## 2016-04-12 DIAGNOSIS — F329 Major depressive disorder, single episode, unspecified: Secondary | ICD-10-CM | POA: Insufficient documentation

## 2016-04-12 DIAGNOSIS — Z794 Long term (current) use of insulin: Secondary | ICD-10-CM

## 2016-04-12 LAB — SEDIMENTATION RATE: Sed Rate: 4 mm/hr (ref 0–30)

## 2016-04-12 MED ORDER — DICLOFENAC SODIUM 1 % TD GEL: 4.0000 g | Freq: Four times a day (QID) | TRANSDERMAL | 2 refills | Status: DC

## 2016-04-12 NOTE — Progress Notes (Signed)
Nursing Pain Medication Assessment:  Safety precautions to be maintained throughout the outpatient stay will include: orient to surroundings, keep bed in low position, maintain call bell within reach at all times, provide assistance with transfer out of bed and ambulation.  Medication Inspection Compliance: Pill count conducted under aseptic conditions, in front of the patient. Neither the pills nor the bottle was removed from the patient's sight at any time. Once count was completed pills were immediately returned to the patient in their original bottle. Pill Count: 1 of 14 pills remain Bottle Appearance: Standard pharmacy container. Clearly labeled. Medication: See above Filled Date: 03/ 29 / 2018

## 2016-04-12 NOTE — Progress Notes (Addendum)
Patient's Name: Lori Mckenzie  MRN: [id number]  Referring Provider: Ardine Eng, MD  DOB: August 19, 1965  PCP: Ardine Eng, MD  DOS: 04/12/2016  Note by: Kathlen Brunswick. Dossie Arbour, MD  Service setting: Ambulatory outpatient  Specialty: Interventional Pain Management  Location: [facility] (AMB) Pain Management Facility    Patient type: Established   Primary Reason(s) for Visit: Encounter for prescription drug management & post-procedure evaluation of chronic illness with mild to moderate exacerbation(Level of risk: moderate) CC: Back Pain (lower) and Neck Pain (lower- unable to move head)  HPI  Lori Mckenzie is a 51 y.o. year old, female patient, who comes today for a post-procedure evaluation and medication management. She has Type 2 diabetes mellitus ([place]); COPD (chronic obstructive pulmonary disease) (HCC); GERD (gastroesophageal reflux disease); OSA on CPAP; Anxiety; Steatohepatitis; Dysthymia; Other social stressor; Ascites; NASH (nonalcoholic steatohepatitis); Hypokalemia; Opiate use (75 MME/Day); Long term prescription opiate use; Long term current use of opiate analgesic; Encounter for therapeutic drug level monitoring; Chronic epigastric abdominal pain; Chronic low back pain (Location of Primary Source of Pain) (Bilateral) (R>L); Chronic neck pain (Location of Tertiary source of pain) (Bilateral) (R>L); Breath shortness; Diastolic dysfunction; Clinical depression; Airway hyperreactivity; Essential (primary) hypertension; 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, L1-2, & L4-5); Lumbar and sacral osteoarthritis; Myofascial pain; Drowsiness; Episode of syncope; Subacute lumbar radiculopathy (left side) (S1 dermatome); Vitamin D deficiency; B12 deficiency; Folate deficiency; Thoracic radiculitis; Elevated sedimentation rate; Elevated C-reactive protein (CRP); Lumbar facet syndrome (Location of Primary Source of Pain) (Bilateral) (R>L); Cervical spondylosis; Chronic feet pain (Location of Secondary source of pain) (Bilateral)  (R>L); Lumbar spondylosis; Encounter for chronic pain management; Chronic shoulder pain (Bilateral) (R>L); Chronic carpal tunnel syndrome (Bilateral); Chronic hip pain (Bilateral) (L>R); Chronic upper back pain (Bilateral) (L>R); Osteoporosis, idiopathic; Abnormal MRI, lumbar spine (02/03/2015); Hepatic encephalopathy ([place]); Radicular pain of thoracic region; Altered mental status; Cirrhosis of liver without ascites ([place]); Elevated liver enzymes; Opiate withdrawal ([place]); Chronic pain syndrome; Asthma; Lumbar spinal stenosis; Lumbosacral spondylosis without myelopathy; Neuromyositis; Somnolence; Spinal stenosis of lumbar region without neurogenic claudication; Spondylosis of lumbar region without myelopathy or radiculopathy; Acute cervical myofascial strain; Allodynia; Osteoarthritis; Chronic pain of left upper extremity; Chronic radicular cervical pain (L); Chronic left sacroiliac joint pain; and Diabetes mellitus, insulin dependent (IDDM), uncontrolled ([place]) on her problem list. Her primarily concern today is the Back Pain (lower) and Neck Pain (lower- unable to move head)  Pain Assessment: Self-Reported Pain Score: 4 /10             Reported level is compatible with observation.       Pain Type: Chronic pain Pain Location: Back Pain Orientation: Lower, Right, Left Pain Descriptors / Indicators: Aching, Stabbing, Constant Pain Frequency: Constant  Lori Mckenzie was last seen on 03/23/2016 for a procedure. During today's appointment we reviewed Lori Mckenzie's post-procedure results, as well as her outpatient medication regimen. The patient comes into the clinics today in a wheelchair. She seems to be unable to ambulate on her own but this seems to be secondary to possibly having a hepatic encephalopathy. She describes having been to an emergency room where she was told that her ammonia levels were elevated. She has a history of hepatic problems and at this point she does not look like she needs to take any  additional pain medication as she looks "stoned" she is having difficulty keeping her eyes open and she is slurring her speech. The patient was encouraged to contact her primary care physician as soon as possible to  deal with her medical problems. At this point, we will hold on any further interventions until she can get this under control.  Further details on both, my assessment(s), as well as the proposed treatment plan, please see below.  Controlled Substance Pharmacotherapy Assessment REMS (Risk Evaluation and Mitigation Strategy)  Analgesic: Oxycodone IR 5 mg by mouth once a day (currently undergoing a taper to 0 mg/day) MME/day: 7.5 mg/day.  Ignatius Specking, RN  04/12/2016  9:53 PM  Signed Nursing Pain Medication Assessment:  Safety precautions to be maintained throughout the outpatient stay will include: orient to surroundings, keep bed in low position, maintain call bell within reach at all times, provide assistance with transfer out of bed and ambulation.  Medication Inspection Compliance: Pill count conducted under aseptic conditions, in front of the patient. Neither the pills nor the bottle was removed from the patient's sight at any time. Once count was completed pills were immediately returned to the patient in their original bottle. Pill Count: 1 of 14 pills remain Bottle Appearance: Standard pharmacy container. Clearly labeled. Medication: See above Filled Date: 03/ 29 / 2018   Pharmacokinetics: Liberation and absorption (onset of action): WNL Distribution (time to peak effect): WNL Metabolism and excretion (duration of action): WNL         Pharmacodynamics: Desired effects: Analgesia: Lori Mckenzie reports >50% benefit. Functional ability: Patient reports that medication allows her to accomplish basic ADLs Clinically meaningful improvement in function (CMIF): Sustained CMIF goals met Perceived effectiveness: Described as relatively effective, allowing for increase in activities  of daily living (ADL) Undesirable effects: Side-effects or Adverse reactions: None reported Monitoring: [place] PMP: Online review of the past 26-monthperiod conducted. Compliant with practice rules and regulations List of all UDS test(s) done:  Lab Results  Component Value Date   TOXASSSELUR FINAL 01/07/2016   TBangorFINAL 09/28/2015   THarpsterFINAL 03/05/2015   TOXASSSELUR FINAL 02/05/2015   TOXASSSELUR FINAL 01/08/2015   Last UDS on record: ToxAssure Select 13  Date Value Ref Range Status  01/07/2016 FINAL  Final    Comment:    ==================================================================== TOXASSURE SELECT 13 (MW) ==================================================================== Test                             Result       Flag       Units Drug Present and Declared for Prescription Verification   Alprazolam                     164          EXPECTED   ng/mg creat   Alpha-hydroxyalprazolam        410          EXPECTED   ng/mg creat    Source of alprazolam is a scheduled prescription medication.    Alpha-hydroxyalprazolam is an expected metabolite of alprazolam. Drug Absent but Declared for Prescription Verification   Oxycodone                      Not Detected UNEXPECTED ng/mg creat ==================================================================== Test                      Result    Flag   Units      Ref Range   Creatinine              140  mg/dL      >=20 ==================================================================== Declared Medications:  The flagging and interpretation on this report are based on the  following declared medications.  Unexpected results may arise from  inaccuracies in the declared medications.  **Note: The testing scope of this panel includes these medications:  Alprazolam  Oxycodone  **Note: The testing scope of this panel does not include following  reported medications:  Albuterol  Aspirin  Cholecalciferol   Citalopram (Celexa)  Cyanocobalamin  Dicyclomine  Fluticasone (Flonase)  Folic acid (Folate)  Furosemide  Gabapentin  Hydrochlorothiazide  Ipratropium  Lactulose  Naloxone (Narcan)  Nystatin  Ondansetron  Pantoprazole  Promethazine  Rifaximin  Rizatriptan  Spironolactone  Vitamin B12 ==================================================================== For clinical consultation, please call [phone]. ====================================================================    UDS interpretation: Compliant Patient informed of the CDC guidelines and recommendations to stay away from the concomitant use of benzodiazepines and opioids due to the increased risk of respiratory depression and death. Medication Assessment Form: Reviewed. Patient indicates being compliant with therapy Treatment compliance: Compliant Risk Assessment Profile: Aberrant behavior: See prior evaluations. None observed or detected today Comorbid factors increasing risk of overdose: See prior notes. No additional risks detected today Risk of substance use disorder (SUD): High-to-Very High Opioid Risk Tool (ORT) Total Score: 8  Interpretation Table:  Score <3 = Low Risk for SUD  Score between 4-7 = Moderate Risk for SUD  Score >8 = High Risk for Opioid Abuse   Risk Mitigation Strategies:  Patient Counseling: Covered Patient-Prescriber Agreement (PPA): Present and active  Notification to other healthcare providers: Done  Pharmacologic Plan: No change in therapy, at this time  Post-Procedure Assessment  03/23/2016 Procedure: Diagnostic left lumbar facet block #1 + diagnostic left sacroiliac joint block #1 under fluoroscopic guidance and IV sedation Pre-procedure pain score:  5/10 Post-procedure pain score: 0/10 (100% relief) Influential Factors: BMI: 45.73 kg/m Intra-procedural challenges: None observed Assessment challenges: Results reported today are inconsistent with those reported on procedure  day, immediately before discharge. Previously the patient had reported 100% relief of the pain, before leaving the facility Post-procedural side-effects, adverse reactions, or complications: Hospitalized due to hyperglycemia. Reported issues: Transient post-op increase in pain over the left groin area, possibly due to the left SI joint injection and the possibility of a ruptured capsule spillage of material into the anterior area of the SI joint.  Sedation: Sedation provided. When no sedatives are used, the analgesic levels obtained are directly associated to the effectiveness of the local anesthetics. However, when sedation is provided, the level of analgesia obtained during the initial 1 hour following the intervention, is believed to be the result of a combination of factors. These factors may include, but are not limited to: 1. The effectiveness of the local anesthetics used. 2. The effects of the analgesic(s) and/or anxiolytic(s) used. 3. The degree of discomfort experienced by the patient at the time of the procedure. 4. The patients ability and reliability in recalling and recording the events. 5. The presence and influence of possible secondary gains and/or psychosocial factors. Reported result: Relief experienced during the 1st hour after the procedure: 100 % (Ultra-Short Term Relief) Interpretative annotation: Analgesia during this period is likely to be Local Anesthetic and/or IV Sedative (Analgesic/Anxiolitic) related.          Effects of local anesthetic: The analgesic effects attained during this period are directly associated to the localized infiltration of local anesthetics and therefore cary significant diagnostic value as to the etiological location, or anatomical origin, of the pain.  Expected duration of relief is directly dependent on the pharmacodynamics of the local anesthetic used. Long-acting (4-6 hours) anesthetics used.  Reported result: Relief during the next 4 to 6 hour  after the procedure: 100 % (Short-Term Relief) Interpretative annotation: Complete relief would suggest area to be the source of the pain.          Long-term benefit: Defined as the period of time past the expected duration of local anesthetics. With the possible exception of prolonged sympathetic blockade from the local anesthetics, benefits during this period are typically attributed to, or associated with, other factors such as analgesic sensory neuropraxia, antiinflammatory effects, or beneficial biochemical changes provided by agents other than the local anesthetics Reported result: Extended relief following procedure: 30 % (Long-Term Relief) Interpretative annotation: Good relief. This could suggest inflammation to be a significant component in the etiology to the pain.          Current benefits: Defined as persistent relief that continues at this point in time.   Reported results: Treated area: <50 %       Interpretative annotation: Ongoing benefits would suggest effective therapeutic approach  Interpretation: Results would suggest a successful diagnostic intervention.          Laboratory Chemistry  Inflammation Markers Lab Results  Component Value Date   CRP 2.8 (H) 01/08/2015   ESRSEDRATE 4 04/12/2016   (CRP: Acute Phase) (ESR: Chronic Phase) Renal Function Markers Lab Results  Component Value Date   BUN 15 08/19/2015   CREATININE 1.09 (H) 08/19/2015   GFRAA >60 08/19/2015   GFRNONAA 57 (L) 08/19/2015   Hepatic Function Markers Lab Results  Component Value Date   AST 65 (H) 08/19/2015   ALT 82 (H) 08/19/2015   ALBUMIN 4.8 08/19/2015   ALKPHOS 116 08/19/2015   Electrolytes Lab Results  Component Value Date   NA 135 08/19/2015   K 4.3 08/19/2015   CL 99 (L) 08/19/2015   CALCIUM 9.4 08/19/2015   MG 1.9 01/08/2015   Neuropathy Markers Lab Results  Component Value Date   VITAMINB12 172 (L) 01/08/2015   Bone Pathology Markers Lab Results  Component Value Date    ALKPHOS 116 08/19/2015   VD25OH 14.3 (L) 01/08/2015   VD125OH2TOT 28.8 01/08/2015   CALCIUM 9.4 08/19/2015   Coagulation Parameters Lab Results  Component Value Date   INR 1.18 12/01/2015   LABPROT 15.1 12/01/2015   APTT 24.6 08/26/2013   PLT 98 (L) 12/01/2015   Cardiovascular Markers Lab Results  Component Value Date   BNP 11 12/11/2012   HGB 15.4 12/01/2015   HCT 43.5 12/01/2015   Note: Lab results reviewed.  Recent Diagnostic Imaging Review  Dg C-arm 1-60 Min-no Report  Result Date: 03/23/2016 Fluoroscopy was utilized by the requesting physician.  No radiographic interpretation.   Note: Imaging results reviewed.          Meds  The patient has a current medication list which includes the following prescription(s): albuterol, albuterol, alprazolam, aspirin ec, cholecalciferol, citalopram, diclofenac sodium, dicyclomine, fluticasone, intrinsi b12-folate, furosemide, gabapentin, insulin aspart, insulin detemir, ipratropium, lactulose, naloxone, nystatin, oxycodone, pantoprazole, promethazine, rifaximin, rizatriptan, spironolactone, sucralfate, and vitamin b-12.  Current Outpatient Prescriptions on File Prior to Visit  Medication Sig  . albuterol (ACCUNEB) 0.63 MG/3ML nebulizer solution Inhale 1 ampule by nebulization every six (6) hours as needed for wheezing.  Marland Kitchen albuterol (PROVENTIL HFA;VENTOLIN HFA) 108 (90 BASE) MCG/ACT inhaler Inhale 2 puffs into the lungs every 4 (four) hours as needed for wheezing or  shortness of breath.  . ALPRAZolam (XANAX) 1 MG tablet Take 1-2 mg by mouth 3 (three) times daily as needed for anxiety.  Marland Kitchen aspirin EC 81 MG tablet Take 81 mg by mouth daily.   . Cholecalciferol (VITAMIN D-1000 MAX ST) 1000 units tablet Take 2,000 Units by mouth.   . citalopram (CELEXA) 20 MG tablet Take 20 mg by mouth.  . dicyclomine (BENTYL) 10 MG capsule Take 10 mg by mouth 4 (four) times daily -  before meals and at bedtime.   . fluticasone (FLONASE) 50 MCG/ACT nasal  spray Place 1-2 sprays into both nostrils daily as needed for rhinitis.   Derald Macleod Factor (INTRINSI B12-FOLATE) [phone] MCG-MCG-MG TABS Take 1 tablet by mouth daily. Reported on 04/06/2015  . furosemide (LASIX) 40 MG tablet Take 80 mg by mouth daily.   Marland Kitchen gabapentin (NEURONTIN) 300 MG capsule Take 900 mg by mouth 4 (four) times daily as needed (for pain).   . insulin aspart (NOVOLOG) 100 UNIT/ML injection Inject 6 Units into the skin 3 (three) times daily before meals.  . Insulin Detemir (LEVEMIR FLEXPEN [place]) Inject 40 Units into the skin.   Marland Kitchen ipratropium (ATROVENT) 0.02 % nebulizer solution Inhale 500 mcg by nebulization Four (4) times a day.  . lactulose (CHRONULAC) 10 GM/15ML solution Take 30 g by mouth 3 (three) times daily as needed.   . naloxone [facility]) 2 MG/2ML injection Inject into the vein as needed.  . nystatin (MYCOSTATIN) powder Apply 1 g topically 3 (three) times daily as needed (for irritation).   Marland Kitchen oxyCODONE (OXY IR/ROXICODONE) 5 MG immediate release tablet Take 1 tablet (5 mg total) by mouth daily. Max: 1/day  . pantoprazole (PROTONIX) 40 MG tablet Take 40 mg by mouth daily.   . promethazine (PHENERGAN) 12.5 MG tablet Take 12.5 mg by mouth every 6 (six) hours as needed for nausea or vomiting.   . rifaximin (XIFAXAN) 550 MG TABS tablet Take 550 mg by mouth 2 (two) times daily.   . rizatriptan (MAXALT) 10 MG tablet Take 10 mg by mouth as needed for migraine.   Marland Kitchen spironolactone (ALDACTONE) 100 MG tablet Take 100 mg by mouth daily.   . sucralfate (CARAFATE) 1 g tablet Take by mouth.  . vitamin B-12 (CYANOCOBALAMIN) 1000 MCG tablet Take 2,000 mcg by mouth daily.  . [DISCONTINUED] SUMAtriptan (IMITREX) 50 MG tablet Take 50 mg by mouth every 2 (two) hours as needed. For migraines   No current facility-administered medications on file prior to visit.    ROS  Constitutional: Denies any fever or chills Gastrointestinal: No reported hemesis, hematochezia, vomiting, or acute  GI distress Musculoskeletal: Denies any acute onset joint swelling, redness, loss of ROM, or weakness Neurological: No reported episodes of acute onset apraxia, aphasia, dysarthria, agnosia, amnesia, paralysis, loss of coordination, or loss of consciousness  Allergies  Lori Mckenzie is allergic to tape and vicodin [hydrocodone-acetaminophen].  PFSH  Drug: Lori Mckenzie  reports that she does not use drugs. Alcohol:  reports that she does not drink alcohol. Tobacco:  reports that she has never smoked. She has never used smokeless tobacco. Medical:  has a past medical history of Abdominal abscess ([place]) (08/25/2014); Acid reflux (08/10/2010); Anxiety; Ascites; Asthma; Back pain; Bile leak, postoperative (07/17/2014); Brittle bone disease; Cancer ([place]); Cervical disc disease; Chronic kidney disease; Collagen vascular disease ([place]); COPD (chronic obstructive pulmonary disease) ([place]); Diabetes mellitus without complication ([place]); GERD (gastroesophageal reflux disease); Hypertension; Hypothyroidism; Left upper quadrant pain (01/09/2014); Major depressive disorder with single episode (12/05/2011);  Major depressive disorder, single episode (12/05/2011); Migraines; NASH (nonalcoholic steatohepatitis); Respiratory infection; Shock ([place]) (09/18/2014); Sleep apnea; Sleep apnea; Syncope (11/16/2014); Thyroid disease; and TIA (transient ischemic attack). Family: family history includes Heart disease in her brother and sister; Lung cancer in her father; Ulcers in her father and sister.  Past Surgical History:  Procedure Laterality Date  . ABDOMINAL HYSTERECTOMY    . CHOLECYSTECTOMY N/A 07/15/2014   Procedure: LAPAROSCOPIC CHOLECYSTECTOMY with liver biopsy ;  Surgeon: Sherri Rad, MD;  Location: [facility] ORS;  Service: General;  Laterality: N/A;  . COLONOSCOPY WITH PROPOFOL N/A 06/23/2014   Procedure: COLONOSCOPY WITH PROPOFOL;  Surgeon: Lollie Sails, MD;  Location: [facility] ENDOSCOPY;  Service: Endoscopy;  Laterality: N/A;  . ERCP N/A  07/16/2014   Procedure: ENDOSCOPIC RETROGRADE CHOLANGIOPANCREATOGRAPHY (ERCP);  Surgeon: Clarene Essex, MD;  Location: [facility] ENDOSCOPY;  Service: Endoscopy;  Laterality: N/A;  . ERCP N/A 10/03/2014   Procedure: ENDOSCOPIC RETROGRADE CHOLANGIOPANCREATOGRAPHY (ERCP);  Surgeon: Hulen Luster, MD;  Location: [facility] ENDOSCOPY;  Service: Gastroenterology;  Laterality: N/A;  . ESOPHAGOGASTRODUODENOSCOPY N/A 06/23/2014   Procedure: ESOPHAGOGASTRODUODENOSCOPY (EGD);  Surgeon: Lollie Sails, MD;  Location: [facility] ENDOSCOPY;  Service: Endoscopy;  Laterality: N/A;  . ESOPHAGOGASTRODUODENOSCOPY (EGD) WITH PROPOFOL N/A 12/01/2015   Procedure: ESOPHAGOGASTRODUODENOSCOPY (EGD) WITH PROPOFOL;  Surgeon: Lollie Sails, MD;  Location: [facility] ENDOSCOPY;  Service: Endoscopy;  Laterality: N/A;  . TUBAL LIGATION    . WISDOM TOOTH EXTRACTION     Constitutional Exam  General appearance: Well nourished, well developed, and well hydrated. In no apparent acute distress Vitals:   04/12/16 1328  BP: 137/83  Pulse: 100  Resp: 16  Temp: 97.9 F (36.6 C)  TempSrc: Oral  SpO2: 97%  Weight: 250 lb (113.4 kg)  Height: _0  (1.575 m)   BMI Assessment: Estimated body mass index is 45.73 kg/m as calculated from the following:   Height as of this encounter: _1  (1.575 m).   Weight as of this encounter: 250 lb (113.4 kg).  BMI interpretation table: BMI level Category Range association with higher incidence of chronic pain  <18 kg/m2 Underweight   18.5-24.9 kg/m2 Ideal body weight   25-29.9 kg/m2 Overweight Increased incidence by 20%  30-34.9 kg/m2 Obese (Class I) Increased incidence by 68%  35-39.9 kg/m2 Severe obesity (Class II) Increased incidence by 136%  >40 kg/m2 Extreme obesity (Class III) Increased incidence by 254%   BMI Readings from Last 4 Encounters:  04/12/16 45.73 kg/m  03/23/16 45.73 kg/m  03/10/16 45.73 kg/m  02/24/16 45.73 kg/m   Wt Readings from Last 4 Encounters:  04/12/16 250 lb (113.4 kg)   03/23/16 250 lb (113.4 kg)  03/10/16 250 lb (113.4 kg)  02/24/16 250 lb (113.4 kg)  Psych/Mental status: She seems to be somnolent, slurring her speech, and difficult to understand. Lori Mckenzie's speech pattern and demeanor seems to suggest oversedation Eyes: Evidence of ptosis Respiratory: No evidence of acute respiratory distress  Cervical Spine Exam  Inspection: No masses, redness, or swelling Alignment: Symmetrical Functional ROM: Unrestricted ROM Stability: No instability detected Muscle strength & Tone: Functionally intact Sensory: Unimpaired Palpation: No palpable anomalies  Upper Extremity (UE) Exam    Side: Right upper extremity  Side: Left upper extremity  Inspection: No masses, redness, swelling, or asymmetry. No contractures  Inspection: No masses, redness, swelling, or asymmetry. No contractures  Functional ROM: Unrestricted ROM          Functional ROM: Unrestricted ROM  Muscle strength & Tone: Functionally intact  Muscle strength & Tone: Functionally intact  Sensory: Unimpaired  Sensory: Unimpaired  Palpation: No palpable anomalies  Palpation: No palpable anomalies  Specialized Test(s): Deferred         Specialized Test(s): Deferred          Thoracic Spine Exam  Inspection: No masses, redness, or swelling Alignment: Symmetrical Functional ROM: Unrestricted ROM Stability: No instability detected Sensory: Unimpaired Muscle strength & Tone: No palpable anomalies  Lumbar Spine Exam  Inspection: No masses, redness, or swelling Alignment: Symmetrical Functional ROM: Unrestricted ROM Stability: No instability detected Muscle strength & Tone: Functionally intact Sensory: Unimpaired Palpation: No palpable anomalies Provocative Tests: Lumbar Hyperextension and rotation test: evaluation deferred today       Patrick's Maneuver: evaluation deferred today              Gait & Posture Assessment  Ambulation: Patient came in today in a wheel chair, possibly due to  her high risk of falls secondary to positively being over sedated. Gait: Very limited, using assistive device to ambulate Posture: Slouching   Lower Extremity Exam    Side: Right lower extremity  Side: Left lower extremity  Inspection: No masses, redness, swelling, or asymmetry. No contractures  Inspection: No masses, redness, swelling, or asymmetry. No contractures  Functional ROM: Unrestricted ROM          Functional ROM: Unrestricted ROM          Muscle strength & Tone: Functionally intact  Muscle strength & Tone: Functionally intact  Sensory: Unimpaired  Sensory: Unimpaired  Palpation: No palpable anomalies  Palpation: No palpable anomalies   Assessment  Primary Diagnosis & Pertinent Problem List: The primary encounter diagnosis was Chronic neck pain (Location of Tertiary source of pain) (Bilateral) (R>L). Diagnoses of Chronic low back pain (Location of Primary Source of Pain) (Bilateral) (R>L), Lumbar facet syndrome (Location of Primary Source of Pain) (Bilateral) (R>L), Chronic shoulder pain (Bilateral) (R>L), Chronic upper back pain (Bilateral) (L>R), Osteoarthritis of spine with radiculopathy, cervical region, Chronic radicular cervical pain (L), Osteoarthritis, Chronic pain syndrome, Elevated C-reactive protein (CRP), Elevated sedimentation rate, Long term prescription opiate use, Opiate use (75 MME/Day), B12 deficiency, Vitamin D deficiency, Cirrhosis of liver without ascites, unspecified hepatic cirrhosis type (HCC), NASH (nonalcoholic steatohepatitis), and Steatohepatitis were also pertinent to this visit.  Status Diagnosis  Worsening Controlled Controlled 1. Chronic neck pain (Location of Tertiary source of pain) (Bilateral) (R>L)   2. Chronic low back pain (Location of Primary Source of Pain) (Bilateral) (R>L)   3. Lumbar facet syndrome (Location of Primary Source of Pain) (Bilateral) (R>L)   4. Chronic shoulder pain (Bilateral) (R>L)   5. Chronic upper back pain (Bilateral)  (L>R)   6. Osteoarthritis of spine with radiculopathy, cervical region   7. Chronic radicular cervical pain (L)   8. Osteoarthritis   9. Chronic pain syndrome   10. Elevated C-reactive protein (CRP)   11. Elevated sedimentation rate   12. Long term prescription opiate use   13. Opiate use (75 MME/Day)   14. B12 deficiency   15. Vitamin D deficiency   16. Cirrhosis of liver without ascites, unspecified hepatic cirrhosis type ([place])   17. NASH (nonalcoholic steatohepatitis)   18. Steatohepatitis      Plan of Care  Pharmacotherapy (Medications Ordered): Meds ordered this encounter  Medications  . diclofenac sodium (VOLTAREN) 1 % GEL    Sig: Apply 4 g topically 4 (four) times daily.    Dispense:  100 g    Refill:  2    Do not add this medication to the electronic "Automatic Refill" notification system. Patient may have prescription filled one day early if pharmacy is closed on scheduled refill date.   New Prescriptions   No medications on file   Medications administered today: Lori Mckenzie had no medications administered during this visit. Lab-work, procedure(s), and/or referral(s): Orders Placed This Encounter  Procedures  . C-reactive protein  . Sedimentation rate  . Vitamin B12  . 25-Hydroxyvitamin D Lcms D2+D3  . ToxASSURE Select 13 (MW), Urine  . Ambulatory referral to Endocrinology   Imaging and/or referral(s): MR CERVICAL SPINE WO CONTRAST AMB REFERRAL TO ENDOCRINOLOGY  Interventional therapies: Planned, scheduled, and/or pending:   No further treatment until the patient improves from her medical problems which at this point seem to include the possibility of a hepatic encephalopathy with increased ammonia levels.    Considering:   Diagnostic bilateral lumbar facet block Possible bilateral lumbar facet radiofrequency ablation Diagnostic bilateral cervical facet block Diagnostic L1-2 lumbar epidural steroid injection Diagnostic, right-sided L4-5 lumbar epidural  steroid injection Diagnostic bilateral intra-articular shoulder joint injection Diagnostic bilateral suprascapular nerve block Possible bilateral suprascapular nerve radiofrequency ablation Palliative left-sided L5-S1 lumbar epidural steroid injection  Diagnostic left S1 selective nerve root block Diagnostic right-sided cervical epidural steroid injection Diagnostic T10-11 thoracic epidural steroid injection Diagnostic bilateral intra-articular hip joint injection Possible bilateral hip radiofrequency ablation   Palliative PRN treatment(s):   Diagnostic bilateral lumbar facet block Diagnostic bilateral cervical facet block Diagnostic L1-2 lumbar epidural steroid injection Diagnostic, right-sided L4-5 lumbar epidural steroid injection Diagnostic bilateral intra-articular shoulder joint injection Diagnostic bilateral suprascapular nerve block Palliative left-sided L5-S1 lumbar epidural steroid injection  Diagnostic left S1 selective nerve root block Diagnostic right-sided cervical epidural steroid injection Diagnostic T10-11 thoracic epidural steroid injection  Diagnostic bilateral intra-articular hip joint injection   Provider-requested follow-up: Return in about 1 month (around 05/12/2016) for (MD) Med-Mgmt.  Future Appointments Date Time Provider [place]  05/12/2016 10:00 AM [place], NP [facility] None   Primary Care Physician: Ardine Eng, MD Location: [facility] Note by: Kathlen Brunswick. Dossie Arbour, M.D, DABA, DABAPM, DABPM, DABIPP, FIPP Date: 04/12/2016; Time: 9:55 PM  Pain Score Disclaimer: We use the NRS-11 scale. This is a self-reported, subjective measurement of pain severity with only modest accuracy. It is used primarily to identify changes within a particular patient. It must be understood that outpatient pain scales are significantly less accurate that those used for research, where they can be applied under ideal controlled  circumstances with minimal exposure to variables. In reality, the score is likely to be a combination of pain intensity and pain affect, where pain affect describes the degree of emotional arousal or changes in action readiness caused by the sensory experience of pain. Factors such as social and work situation, setting, emotional state, anxiety levels, expectation, and prior pain experience may influence pain perception and show large inter-individual differences that may also be affected by time variables.  Patient instructions provided during this appointment: There are no Patient Instructions on file for this visit.

## 2016-04-13 LAB — VITAMIN B12: Vitamin B-12: 272 pg/mL (ref 180–914)

## 2016-04-13 LAB — C-REACTIVE PROTEIN: CRP: <0.8 mg/dL (ref <1.0)

## 2016-04-14 ENCOUNTER — Telehealth: Payer: Self-pay

## 2016-04-14 NOTE — Telephone Encounter (Signed)
Cancel this patient's order for the cervical epidural until she can fully recover from her medical problems than possible hepatic encephalopathy.  Per Dr Dossie Arbour

## 2016-04-15 LAB — 25-HYDROXY VITAMIN D LCMS D2+D3
25-Hydroxy, Vitamin D-2: 3.9 ng/mL
25-Hydroxy, Vitamin D-3: 32 ng/mL
25-Hydroxy, Vitamin D: 36 ng/mL

## 2016-04-18 LAB — TOXASSURE SELECT 13 (MW), URINE

## 2016-04-20 ENCOUNTER — Telehealth: Payer: Self-pay | Admitting: *Deleted

## 2016-04-20 NOTE — Telephone Encounter (Signed)
Spoke with patient and she states she is having pain.  Patient appears to have had an appointment tomorrow which was cancelled.  Patient would like to reschedule appointment.  Call transferred to secretary so that she could reschedule appointment.

## 2016-04-21 ENCOUNTER — Ambulatory Visit: Payer: Medicaid Other | Admitting: Pain Medicine

## 2016-04-21 ENCOUNTER — Ambulatory Visit: Payer: Medicaid Other | Attending: Nurse Practitioner | Admitting: Pain Medicine

## 2016-04-21 ENCOUNTER — Encounter: Payer: Self-pay | Admitting: Pain Medicine

## 2016-04-21 ENCOUNTER — Ambulatory Visit
Admission: RE | Admit: 2016-04-21 | Discharge: 2016-04-21 | Disposition: A | Discharge status: Home / Self Care | Payer: Medicaid Other | Source: Ambulatory Visit | Attending: Pain Medicine | Admitting: Pain Medicine

## 2016-04-21 VITALS — BP 130/71 | HR 102 | Temp 97.0°F | Resp 16 | Ht 62.0 in | Wt 265.0 lb

## 2016-04-21 DIAGNOSIS — K219 Gastro-esophageal reflux disease without esophagitis: Secondary | ICD-10-CM | POA: Insufficient documentation

## 2016-04-21 DIAGNOSIS — M25552 Pain in left hip: Secondary | ICD-10-CM | POA: Diagnosis not present

## 2016-04-21 DIAGNOSIS — K7290 Hepatic failure, unspecified without coma: Secondary | ICD-10-CM | POA: Diagnosis not present

## 2016-04-21 DIAGNOSIS — M533 Sacrococcygeal disorders, not elsewhere classified: Secondary | ICD-10-CM

## 2016-04-21 DIAGNOSIS — M25511 Pain in right shoulder: Secondary | ICD-10-CM | POA: Insufficient documentation

## 2016-04-21 DIAGNOSIS — M5442 Lumbago with sciatica, left side: Principal | ICD-10-CM

## 2016-04-21 DIAGNOSIS — M542 Cervicalgia: Secondary | ICD-10-CM | POA: Insufficient documentation

## 2016-04-21 DIAGNOSIS — Z7982 Long term (current) use of aspirin: Secondary | ICD-10-CM | POA: Diagnosis not present

## 2016-04-21 DIAGNOSIS — M5441 Lumbago with sciatica, right side: Secondary | ICD-10-CM | POA: Insufficient documentation

## 2016-04-21 DIAGNOSIS — I129 Hypertensive chronic kidney disease with stage 1 through stage 4 chronic kidney disease, or unspecified chronic kidney disease: Secondary | ICD-10-CM | POA: Diagnosis not present

## 2016-04-21 DIAGNOSIS — M48061 Spinal stenosis, lumbar region without neurogenic claudication: Secondary | ICD-10-CM | POA: Diagnosis not present

## 2016-04-21 DIAGNOSIS — M479 Spondylosis, unspecified: Secondary | ICD-10-CM | POA: Insufficient documentation

## 2016-04-21 DIAGNOSIS — Z794 Long term (current) use of insulin: Secondary | ICD-10-CM | POA: Diagnosis not present

## 2016-04-21 DIAGNOSIS — M25559 Pain in unspecified hip: Secondary | ICD-10-CM | POA: Diagnosis not present

## 2016-04-21 DIAGNOSIS — G4733 Obstructive sleep apnea (adult) (pediatric): Secondary | ICD-10-CM | POA: Insufficient documentation

## 2016-04-21 DIAGNOSIS — Z79899 Other long term (current) drug therapy: Secondary | ICD-10-CM | POA: Insufficient documentation

## 2016-04-21 DIAGNOSIS — E538 Deficiency of other specified B group vitamins: Secondary | ICD-10-CM | POA: Diagnosis not present

## 2016-04-21 DIAGNOSIS — G8929 Other chronic pain: Secondary | ICD-10-CM

## 2016-04-21 DIAGNOSIS — M4696 Unspecified inflammatory spondylopathy, lumbar region: Secondary | ICD-10-CM | POA: Diagnosis not present

## 2016-04-21 DIAGNOSIS — E559 Vitamin D deficiency, unspecified: Secondary | ICD-10-CM | POA: Diagnosis not present

## 2016-04-21 DIAGNOSIS — M25551 Pain in right hip: Secondary | ICD-10-CM | POA: Diagnosis not present

## 2016-04-21 DIAGNOSIS — E1122 Type 2 diabetes mellitus with diabetic chronic kidney disease: Secondary | ICD-10-CM | POA: Insufficient documentation

## 2016-04-21 DIAGNOSIS — N189 Chronic kidney disease, unspecified: Secondary | ICD-10-CM | POA: Diagnosis not present

## 2016-04-21 DIAGNOSIS — M5416 Radiculopathy, lumbar region: Secondary | ICD-10-CM | POA: Diagnosis not present

## 2016-04-21 DIAGNOSIS — F329 Major depressive disorder, single episode, unspecified: Secondary | ICD-10-CM | POA: Diagnosis not present

## 2016-04-21 DIAGNOSIS — Z90710 Acquired absence of both cervix and uterus: Secondary | ICD-10-CM | POA: Insufficient documentation

## 2016-04-21 DIAGNOSIS — Z9889 Other specified postprocedural states: Secondary | ICD-10-CM | POA: Insufficient documentation

## 2016-04-21 DIAGNOSIS — M25512 Pain in left shoulder: Secondary | ICD-10-CM | POA: Insufficient documentation

## 2016-04-21 DIAGNOSIS — M47816 Spondylosis without myelopathy or radiculopathy, lumbar region: Secondary | ICD-10-CM

## 2016-04-21 DIAGNOSIS — Z79891 Long term (current) use of opiate analgesic: Secondary | ICD-10-CM | POA: Diagnosis not present

## 2016-04-21 DIAGNOSIS — J449 Chronic obstructive pulmonary disease, unspecified: Secondary | ICD-10-CM | POA: Diagnosis not present

## 2016-04-21 DIAGNOSIS — Z9049 Acquired absence of other specified parts of digestive tract: Secondary | ICD-10-CM | POA: Insufficient documentation

## 2016-04-21 NOTE — Progress Notes (Signed)
Patient's Name: Lori Mckenzie  MRN: [id number]  Referring Provider: Ardine Eng, MD  DOB: 19-Sep-1965  PCP: Ardine Eng, MD  DOS: 04/21/2016  Note by: Kathlen Brunswick. Dossie Arbour, MD  Service setting: Ambulatory outpatient  Specialty: Interventional Pain Management  Location: [facility] (AMB) Pain Management Facility    Patient type: Established   Primary Reason(s) for Visit: Encounter for prescription drug management (Level of risk: moderate) CC: Back Pain (mid lower)  HPI  Lori Mckenzie is a 51 y.o. year old, female patient, who comes today for a medication management evaluation. She has Type 2 diabetes mellitus ([place]); COPD (chronic obstructive pulmonary disease) (HCC); GERD (gastroesophageal reflux disease); OSA on CPAP; Anxiety; Steatohepatitis; Dysthymia; Other social stressor; Ascites; NASH (nonalcoholic steatohepatitis); Hypokalemia; Opiate use (75 MME/Day); Long term prescription opiate use; Long term current use of opiate analgesic; Encounter for therapeutic drug level monitoring; Chronic epigastric abdominal pain; Chronic low back pain (Location of Primary Source of Pain) (Bilateral) (R>L); Chronic neck pain (Location of Tertiary source of pain) (Bilateral) (R>L); Breath shortness; Diastolic dysfunction; Clinical depression; Airway hyperreactivity; Essential (primary) hypertension; 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, L1-2, & L4-5); Lumbar and sacral osteoarthritis; Myofascial pain; Drowsiness; Episode of syncope; Subacute lumbar radiculopathy (left side) (S1 dermatome); Vitamin D deficiency; B12 deficiency; Folate deficiency; Thoracic radiculitis; Elevated sedimentation rate; Elevated C-reactive protein (CRP); Lumbar facet syndrome (Location of Primary Source of Pain) (Bilateral) (R>L); Cervical spondylosis; Chronic feet pain (Location of Secondary source of pain) (Bilateral) (R>L); Lumbar spondylosis; Encounter for chronic pain management; Chronic shoulder pain (Bilateral) (R>L); Chronic carpal tunnel syndrome  (Bilateral); Chronic hip pain (Bilateral) (L>R); Chronic upper back pain (Bilateral) (L>R); Osteoporosis, idiopathic; Abnormal MRI, lumbar spine (02/03/2015); Hepatic encephalopathy ([place]); Radicular pain of thoracic region; Altered mental status; Cirrhosis of liver without ascites ([place]); Elevated liver enzymes; Opiate withdrawal ([place]); Chronic pain syndrome; Asthma; Lumbar spinal stenosis; Lumbosacral spondylosis without myelopathy; Neuromyositis; Somnolence; Spinal stenosis of lumbar region without neurogenic claudication; Spondylosis of lumbar region without myelopathy or radiculopathy; Acute cervical myofascial strain; Allodynia; Osteoarthritis; Chronic pain of left upper extremity; Chronic radicular cervical pain (L); Chronic left sacroiliac joint pain; and Diabetes mellitus, insulin dependent (IDDM), uncontrolled ([place]) on her problem list. Her primarily concern today is the Back Pain (mid lower)  Pain Assessment: Self-Reported Pain Score: 5 /10             Reported level is compatible with observation.       Pain Type: Chronic pain Pain Location: Back Pain Orientation: Mid, Lower, Right, Left Pain Descriptors / Indicators: Aching, Stabbing, Constant Pain Frequency: Constant  Lori Mckenzie was last scheduled for an appointment on 04/12/2016 for medication management. During today's appointment we reviewed [name]'s chronic pain status, as well as her outpatient medication regimen. Improved since last visit. She does no longer look stoned or out of it.  The patient  reports that she does not use drugs. Her body mass index is 48.47 kg/m.  Further details on both, my assessment(s), as well as the proposed treatment plan, please see below.  Controlled Substance Pharmacotherapy Assessment REMS (Risk Evaluation and Mitigation Strategy)  Analgesic: NO MORE OPIOIDS MME/day: 0 mg/day.  Ignatius Specking, RN  04/21/2016 10:48 AM  Sign at close encounter Nursing Pain Medication Assessment:  Safety  precautions to be maintained throughout the outpatient stay will include: orient to surroundings, keep bed in low position, maintain call bell within reach at all times, provide assistance with transfer out of bed and ambulation.  Medication Inspection Compliance: Pill count  conducted under aseptic conditions, in front of the patient. Neither the pills nor the bottle was removed from the patient's sight at any time. Once count was completed pills were immediately returned to the patient in their original bottle. Pill Count: 0 of 7 pills remain Bottle Appearance: Standard pharmacy container. Clearly labeled. Medication: Oxycodone IR Filled Date: 04 / 5 / 2018   Pharmacokinetics: Liberation and absorption (onset of action): WNL Distribution (time to peak effect): WNL Metabolism and excretion (duration of action): WNL         Pharmacodynamics: Desired effects: Analgesia: Lori Mckenzie reports >50% benefit. Functional ability: Patient reports that medication allows her to accomplish basic ADLs Clinically meaningful improvement in function (CMIF): Sustained CMIF goals met Perceived effectiveness: Described as relatively effective, allowing for increase in activities of daily living (ADL) Undesirable effects: Side-effects or Adverse reactions: None reported Monitoring: [place] PMP: Online review of the past 58-monthperiod conducted. Compliant with practice rules and regulations List of all UDS test(s) done:  Lab Results  Component Value Date   TOXASSSELUR FINAL 01/07/2016   TD[place]soFINAL 09/28/2015   TAldaFINAL 03/05/2015   TOXASSSELUR FINAL 02/05/2015   T[place] AFBFINAL 01/08/2015   SUMMARY FINAL 04/12/2016   Last UDS on record: Tox[employer]ure Select 13  Date Value Ref Range Status  01/07/2016 FINAL  Final    Comment:    ==================================================================== TOXASSURE SELECT 13  (MW) ==================================================================== Test                             Result       Flag       Units Drug Present and Declared for Prescription Verification   Alprazolam                     164          EXPECTED   ng/mg creat   Alpha-hydroxyalprazolam        410          EXPECTED   ng/mg creat    Source of alprazolam is a scheduled prescription medication.    Alpha-hydroxyalprazolam is an expected metabolite of alprazolam. Drug Absent but Declared for Prescription Verification   Oxycodone                      Not Detected UNEXPECTED ng/mg creat ==================================================================== Test                      Result    Flag   Units      Ref Range   Creatinine              140              mg/dL      >=20 ==================================================================== Declared Medications:  The flagging and interpretation on this report are based on the  following declared medications.  Unexpected results may arise from  inaccuracies in the declared medications.  **Note: The testing scope of this panel includes these medications:  Alprazolam  Oxycodone  **Note: The testing scope of this panel does not include following  reported medications:  Albuterol  Aspirin  Cholecalciferol  Citalopram (Celexa)  Cyanocobalamin  Dicyclomine  Fluticasone (Flonase)  Folic acid (Folate)  Furosemide  Gabapentin  Hydrochlorothiazide  Ipratropium  Lactulose  Naloxone (Narcan)  Nystatin  Ondansetron  Pantoprazole  Promethazine  Rifaximin  Rizatriptan  Spironolactone  Vitamin B12 ====================================================================  For clinical consultation, please call [phone]. ====================================================================    UDS interpretation: Non-Compliant          Medication Assessment Form: Reviewed. Patient indicates being compliant with therapy Treatment compliance:  Compliant Risk Assessment Profile: Aberrant behavior: See prior evaluations. None observed or detected today Comorbid factors increasing risk of overdose: See prior notes. No additional risks detected today Risk of substance use disorder (SUD): Low Opioid Risk Tool (ORT) Total Score: 8  Interpretation Table:  Score <3 = Low Risk for SUD  Score between 4-7 = Moderate Risk for SUD  Score >8 = High Risk for Opioid Abuse   Risk Mitigation Strategies:  Patient Counseling: Covered Patient-Prescriber Agreement (PPA): Present and active  Notification to other healthcare providers: Done  Pharmacologic Plan: Opioid therapy discontinued  Laboratory Chemistry  Inflammation Markers Lab Results  Component Value Date   CRP <0.8 04/12/2016   ESRSEDRATE 4 04/12/2016   (CRP: Acute Phase) (ESR: Chronic Phase) Renal Function Markers Lab Results  Component Value Date   BUN 15 08/19/2015   CREATININE 1.09 (H) 08/19/2015   GFRAA >60 08/19/2015   GFRNONAA 57 (L) 08/19/2015   Hepatic Function Markers Lab Results  Component Value Date   AST 65 (H) 08/19/2015   ALT 82 (H) 08/19/2015   ALBUMIN 4.8 08/19/2015   ALKPHOS 116 08/19/2015   Electrolytes Lab Results  Component Value Date   NA 135 08/19/2015   K 4.3 08/19/2015   CL 99 (L) 08/19/2015   CALCIUM 9.4 08/19/2015   MG 1.9 01/08/2015   Neuropathy Markers Lab Results  Component Value Date   VITAMINB12 272 04/12/2016   Bone Pathology Markers Lab Results  Component Value Date   ALKPHOS 116 08/19/2015   VD25OH 14.3 (L) 01/08/2015   VD125OH2TOT 28.8 01/08/2015   25OHVITD1 36 04/12/2016   25OHVITD2 3.9 04/12/2016   25OHVITD3 32 04/12/2016   CALCIUM 9.4 08/19/2015   Coagulation Parameters Lab Results  Component Value Date   INR 1.18 12/01/2015   LABPROT 15.1 12/01/2015   APTT 24.6 08/26/2013   PLT 98 (L) 12/01/2015   Cardiovascular Markers Lab Results  Component Value Date   BNP 11 12/11/2012   HGB 15.4 12/01/2015    HCT 43.5 12/01/2015   Note: Lab results reviewed.  Recent Diagnostic Imaging Review  No results found. Note: Imaging results reviewed.          Meds  The patient has a current medication list which includes the following prescription(s): albuterol, albuterol, alprazolam, aspirin ec, cholecalciferol, citalopram, diclofenac sodium, dicyclomine, fluticasone, intrinsi b12-folate, furosemide, gabapentin, insulin aspart, insulin detemir, ipratropium, lactulose, naloxone, nystatin, pantoprazole, promethazine, rifaximin, rizatriptan, spironolactone, sucralfate, vitamin b-12, and oxycodone.  Current Outpatient Prescriptions on File Prior to Visit  Medication Sig  . albuterol (ACCUNEB) 0.63 MG/3ML nebulizer solution Inhale 1 ampule by nebulization every six (6) hours as needed for wheezing.  Marland Kitchen albuterol (PROVENTIL HFA;VENTOLIN HFA) 108 (90 BASE) MCG/ACT inhaler Inhale 2 puffs into the lungs every 4 (four) hours as needed for wheezing or shortness of breath.  . ALPRAZolam (XANAX) 1 MG tablet Take 1-2 mg by mouth 3 (three) times daily as needed for anxiety.  Marland Kitchen aspirin EC 81 MG tablet Take 81 mg by mouth daily.   . Cholecalciferol (VITAMIN D-1000 MAX ST) 1000 units tablet Take 2,000 Units by mouth.   . citalopram (CELEXA) 20 MG tablet Take 20 mg by mouth.  . diclofenac sodium (VOLTAREN) 1 % GEL Apply 4 g topically 4 (four) times daily.  Marland Kitchen  dicyclomine (BENTYL) 10 MG capsule Take 10 mg by mouth 4 (four) times daily -  before meals and at bedtime.   . fluticasone (FLONASE) 50 MCG/ACT nasal spray Place 1-2 sprays into both nostrils daily as needed for rhinitis.   Derald Macleod Factor (INTRINSI B12-FOLATE) 381-829-93 MCG-MCG-MG TABS Take 1 tablet by mouth daily. Reported on 04/06/2015  . furosemide (LASIX) 40 MG tablet Take 80 mg by mouth daily.   Marland Kitchen gabapentin (NEURONTIN) 300 MG capsule Take 900 mg by mouth 4 (four) times daily as needed (for pain).   . insulin aspart (NOVOLOG) 100 UNIT/ML injection  Inject 6 Units into the skin 3 (three) times daily before meals.  . Insulin Detemir (LEVEMIR FLEXPEN [place]) Inject 40 Units into the skin.   Marland Kitchen ipratropium (ATROVENT) 0.02 % nebulizer solution Inhale 500 mcg by nebulization Four (4) times a day.  . lactulose (CHRONULAC) 10 GM/15ML solution Take 30 g by mouth 3 (three) times daily as needed.   . naloxone [facility]) 2 MG/2ML injection Inject into the vein as needed.  . nystatin (MYCOSTATIN) powder Apply 1 g topically 3 (three) times daily as needed (for irritation).   . pantoprazole (PROTONIX) 40 MG tablet Take 40 mg by mouth daily.   . promethazine (PHENERGAN) 12.5 MG tablet Take 12.5 mg by mouth every 6 (six) hours as needed for nausea or vomiting.   . rifaximin (XIFAXAN) 550 MG TABS tablet Take 550 mg by mouth 2 (two) times daily.   . rizatriptan (MAXALT) 10 MG tablet Take 10 mg by mouth as needed for migraine.   Marland Kitchen spironolactone (ALDACTONE) 100 MG tablet Take 100 mg by mouth daily.   . sucralfate (CARAFATE) 1 g tablet Take by mouth.  . vitamin B-12 (CYANOCOBALAMIN) 1000 MCG tablet Take 2,000 mcg by mouth daily.  Marland Kitchen oxyCODONE (OXY IR/ROXICODONE) 5 MG immediate release tablet Take 1 tablet (5 mg total) by mouth daily. Max: 1/day  . [DISCONTINUED] SUMAtriptan (IMITREX) 50 MG tablet Take 50 mg by mouth every 2 (two) hours as needed. For migraines   No current facility-administered medications on file prior to visit.    ROS  Constitutional: Denies any fever or chills Gastrointestinal: No reported hemesis, hematochezia, vomiting, or acute GI distress Musculoskeletal: Denies any acute onset joint swelling, redness, loss of ROM, or weakness Neurological: No reported episodes of acute onset apraxia, aphasia, dysarthria, agnosia, amnesia, paralysis, loss of coordination, or loss of consciousness  Allergies  Lori Mckenzie is allergic to tape and vicodin [hydrocodone-acetaminophen].  PFSH  Drug: Lori Mckenzie  reports that she does not use drugs. Alcohol:   reports that she does not drink alcohol. Tobacco:  reports that she has never smoked. She has never used smokeless tobacco. Medical:  has a past medical history of Abdominal abscess (08/25/2014); Acid reflux (08/10/2010); Anxiety; Ascites; Asthma; Back pain; Bile leak, postoperative (07/17/2014); Brittle bone disease; Cancer ([place]); Cervical disc disease; Chronic kidney disease; Collagen vascular disease ([place]); COPD (chronic obstructive pulmonary disease) ([place]); Diabetes mellitus without complication ([place]); GERD (gastroesophageal reflux disease); Hypertension; Hypothyroidism; Left upper quadrant pain (01/09/2014); Major depressive disorder with single episode (12/05/2011); Major depressive disorder, single episode (12/05/2011); Migraines; NASH (nonalcoholic steatohepatitis); Respiratory infection; Shock ([place]) (09/18/2014); Sleep apnea; Sleep apnea; Syncope (11/16/2014); Thyroid disease; and TIA (transient ischemic attack). Family: family history includes Heart disease in her brother and sister; Lung cancer in her father; Ulcers in her father and sister.  Past Surgical History:  Procedure Laterality Date  . ABDOMINAL HYSTERECTOMY    . CHOLECYSTECTOMY N/A  07/15/2014   Procedure: LAPAROSCOPIC CHOLECYSTECTOMY with liver biopsy ;  Surgeon: Sherri Rad, MD;  Location: [facility] ORS;  Service: General;  Laterality: N/A;  . COLONOSCOPY WITH PROPOFOL N/A 06/23/2014   Procedure: COLONOSCOPY WITH PROPOFOL;  Surgeon: Lollie Sails, MD;  [facility] ENDOSCOPY;  Service: Endoscopy;  Laterality: N/A;  . ERCP N/A 07/16/2014   Procedure: ENDOSCOPIC RETROGRADE CHOLANGIOPANCREATOGRAPHY (ERCP);  Surgeon: Clarene Essex, MD;  Location: [facility] ENDOSCOPY;  Service: Endoscopy;  Laterality: N/A;  . ERCP N/A 10/03/2014   Procedure: ENDOSCOPIC RETROGRADE CHOLANGIOPANCREATOGRAPHY (ERCP);  Surgeon: Hulen Luster, MD;  Location: [facility] ENDOSCOPY;  Service: Gastroenterology;  Laterality: N/A;  . ESOPHAGOGASTRODUODENOSCOPY N/A 06/23/2014   Procedure:  ESOPHAGOGASTRODUODENOSCOPY (EGD);  Surgeon: Lollie Sails, MD;  Location: [facility] ENDOSCOPY;  Service: Endoscopy;  Laterality: N/A;  . ESOPHAGOGASTRODUODENOSCOPY (EGD) WITH PROPOFOL N/A 12/01/2015   Procedure: ESOPHAGOGASTRODUODENOSCOPY (EGD) WITH PROPOFOL;  Surgeon: Lollie Sails, MD;  Location: [facility] ENDOSCOPY;  Service: Endoscopy;  Laterality: N/A;  . TUBAL LIGATION    . WISDOM TOOTH EXTRACTION     Constitutional Exam  General appearance: Well nourished, well developed, and well hydrated. In no apparent acute distress Vitals:   04/21/16 0951  BP: 130/71  Pulse: (!) 102  Resp: 16  Temp: 97 F (36.1 C)  TempSrc: Oral  SpO2: 97%  Weight: 265 lb (120.2 kg)  Height: _0  (1.575 m)   BMI Assessment: Estimated body mass index is 48.47 kg/m as calculated from the following:   Height as of this encounter: _1  (1.575 m).   Weight as of this encounter: 265 lb (120.2 kg).  BMI interpretation table: BMI level Category Range association with higher incidence of chronic pain  <18 kg/m2 Underweight   18.5-24.9 kg/m2 Ideal body weight   25-29.9 kg/m2 Overweight Increased incidence by 20%  30-34.9 kg/m2 Obese (Class I) Increased incidence by 68%  35-39.9 kg/m2 Severe obesity (Class II) Increased incidence by 136%  >40 kg/m2 Extreme obesity (Class III) Increased incidence by 254%   BMI Readings from Last 4 Encounters:  04/21/16 48.47 kg/m  04/12/16 45.73 kg/m  03/23/16 45.73 kg/m  03/10/16 45.73 kg/m   Wt Readings from Last 4 Encounters:  04/21/16 265 lb (120.2 kg)  04/12/16 250 lb (113.4 kg)  03/23/16 250 lb (113.4 kg)  03/10/16 250 lb (113.4 kg)  Psych/Mental status: Alert, oriented x 3 (person, place, & time)       Eyes: PERLA Respiratory: No evidence of acute respiratory distress  Cervical Spine Exam  Inspection: No masses, redness, or swelling Alignment: Symmetrical Functional ROM: Unrestricted ROM Stability: No instability detected Muscle strength & Tone:  Functionally intact Sensory: Unimpaired Palpation: No palpable anomalies              Upper Extremity (UE) Exam    Side: Right upper extremity  Side: Left upper extremity  Inspection: No masses, redness, swelling, or asymmetry. No contractures  Inspection: No masses, redness, swelling, or asymmetry. No contractures  Functional ROM: Unrestricted ROM          Functional ROM: Unrestricted ROM          Muscle strength & Tone: Functionally intact  Muscle strength & Tone: Functionally intact  Sensory: Unimpaired  Sensory: Unimpaired  Palpation: No palpable anomalies              Palpation: No palpable anomalies              Specialized Test(s): Deferred  Specialized Test(s): Deferred          Thoracic Spine Exam  Inspection: No masses, redness, or swelling Alignment: Symmetrical Functional ROM: Unrestricted ROM Stability: No instability detected Sensory: Unimpaired Muscle strength & Tone: No palpable anomalies  Lumbar Spine Exam  Inspection: No masses, redness, or swelling Alignment: Symmetrical Functional ROM: Unrestricted ROM Stability: No instability detected Muscle strength & Tone: Functionally intact Sensory: Unimpaired Palpation: No palpable anomalies Provocative Tests: Lumbar Hyperextension and rotation test: evaluation deferred today       Patrick's Maneuver: evaluation deferred today              Gait & Posture Assessment  Ambulation: Unassisted Gait: Relatively normal for age and body habitus Posture: WNL   Lower Extremity Exam    Side: Right lower extremity  Side: Left lower extremity  Inspection: No masses, redness, swelling, or asymmetry. No contractures  Inspection: No masses, redness, swelling, or asymmetry. No contractures  Functional ROM: Unrestricted ROM          Functional ROM: Unrestricted ROM          Muscle strength & Tone: Functionally intact  Muscle strength & Tone: Functionally intact  Sensory: Unimpaired  Sensory: Unimpaired  Palpation: No  palpable anomalies  Palpation: No palpable anomalies   Assessment  Primary Diagnosis & Pertinent Problem List: The primary encounter diagnosis was Chronic low back pain (Location of Primary Source of Pain) (Bilateral) (R>L). Diagnoses of Chronic hip pain, unspecified laterality, Lumbar facet syndrome (Location of Primary Source of Pain) (Bilateral) (R>L), and Chronic left sacroiliac joint pain were also pertinent to this visit.  Status Diagnosis  Persistent Persistent Persistent 1. Chronic low back pain (Location of Primary Source of Pain) (Bilateral) (R>L)   2. Chronic hip pain, unspecified laterality   3. Lumbar facet syndrome (Location of Primary Source of Pain) (Bilateral) (R>L)   4. Chronic left sacroiliac joint pain      Plan of Care  Pharmacotherapy (Medications Ordered): No orders of the defined types were placed in this encounter.  New Prescriptions   No medications on file   Medications administered today: Lori Mckenzie had no medications administered during this visit. Lab-work, procedure(s), and/or referral(s): Orders Placed This Encounter  Procedures  . LUMBAR FACET(MEDIAL BRANCH NERVE BLOCK) MBNB  . SACROILIAC JOINT INJECTINS  . DG HIP UNILAT W OR W/O PELVIS 2-3 VIEWS LEFT  . DG HIP UNILAT W OR W/O PELVIS 2-3 VIEWS RIGHT   Imaging and/or referral(s): None  Interventional therapies: Planned, scheduled, and/or pending:   Diagnostic bilateral lumbar facet block #2 and left S-I block #2 under fluoroscopic guidance and IV sedation, WITHOUT STEROIDS!!.   Considering:   Diagnostic bilateral lumbar facet block Possible bilateral lumbar facet radiofrequency ablation Diagnostic bilateral cervical facet block Diagnostic L1-2 lumbar epidural steroid injection Diagnostic, right-sided L4-5 lumbar epidural steroid injection Diagnostic bilateral intra-articular shoulder joint injection Diagnostic bilateral suprascapular nerve block Possible bilateral suprascapular nerve  radiofrequency ablation Palliative left-sided L5-S1 lumbar epidural steroid injection  Diagnostic left S1 selective nerve root block Diagnostic right-sided cervical epidural steroid injection Diagnostic T10-11 thoracic epidural steroid injection Diagnostic bilateral intra-articular hip joint injection Possible bilateral hip radiofrequency ablation   Palliative PRN treatment(s):   Diagnostic bilateral lumbar facet block Diagnostic bilateral cervical facet block Diagnostic L1-2 lumbar epidural steroid injection Diagnostic, right-sided L4-5 lumbar epidural steroid injection Diagnostic bilateral intra-articular shoulder joint injection Diagnostic bilateral suprascapular nerve block Palliative left-sided L5-S1 lumbar epidural steroid injection  Diagnostic left S1 selective nerve root block Diagnostic  right-sided cervical epidural steroid injection Diagnostic T10-11 thoracic epidural steroid injection  Diagnostic bilateral intra-articular hip joint injection   Provider-requested follow-up: No Follow-up on file.  Future Appointments Date Time Provider [place]  05/04/2016 9:45 AM Milinda Pointer, MD [facility] None   Primary Care Physician: Ardine Eng, MD Location: [facility] Outpatient Pain Management Facility Note by: Kathlen Brunswick. Dossie Arbour, M.D, DABA, DABAPM, DABPM, DABIPP, FIPP Date: 04/21/2016; Time: 8:39 PM  Patient instructions provided during this appointment: Patient Instructions   Preparing for Procedure with Sedation Instructions: . Oral Intake: Do not eat or drink anything for at least 8 hours prior to your procedure. . Transportation: Public transportation is not allowed. Bring an adult driver. The driver must be physically present in our waiting room before any procedure can be started. Marland Kitchen Physical Assistance: Bring an adult physically capable of assisting you, in the event you need help. This adult should keep you company at home for at least 6 hours after the  procedure. . Blood Pressure Medicine: Take your blood pressure medicine with a sip of water the morning of the procedure. . Blood thinners:  . Diabetics on insulin: Notify the staff so that you can be scheduled 1st case in the morning. If your diabetes requires high dose insulin, take only  of your normal insulin dose the morning of the procedure and notify the staff that you have done so. . Preventing infections: Shower with an antibacterial soap the morning of your procedure. . Build-up your immune system: Take 1000 mg of Vitamin C with every meal (3 times a day) the day prior to your procedure. Marland Kitchen Antibiotics: Inform the staff if you have a condition or reason that requires you to take antibiotics before dental procedures. . Pregnancy: If you are pregnant, call and cancel the procedure. . Sickness: If you have a cold, fever, or any active infections, call and cancel the procedure. . Arrival: You must be in the facility at least 30 minutes prior to your scheduled procedure. . Children: Do not bring children with you. . Dress appropriately: Bring dark clothing that you would not mind if they get stained. . Valuables: Do not bring any jewelry or valuables. Procedure appointments are reserved for interventional treatments only. Marland Kitchen No Prescription Refills. . No medication changes will be discussed during procedure appointments. . No disability issues will be discussed.  ____________________________________________________________________________________________   Facet Joint Block The facet joints connect the bones of the spine (vertebrae). They make it possible for you to bend, twist, and make other movements with your spine. They also keep you from bending too far, twisting too far, and making other excessive movements. A facet joint block is a procedure where a numbing medicine (anesthetic) is injected into a facet joint. Often, a type of anti-inflammatory medicine called a steroid is also  injected. A facet joint block may be done to diagnose neck or back pain. If the pain gets better after a facet joint block, it means the pain is probably coming from the facet joint. If the pain does not get better, it means the pain is probably not coming from the facet joint. A facet joint block may also be done to relieve neck or back pain caused by an inflamed facet joint. A facet joint block is only done to relieve pain if the pain does not improve with other methods, such as medicine, exercise programs, and physical therapy. Tell a health care provider about:  Any allergies you have.  All medicines you are  taking, including vitamins, herbs, eye drops, creams, and over-the-counter medicines.  Any problems you or family members have had with anesthetic medicines.  Any blood disorders you have.  Any surgeries you have had.  Any medical conditions you have.  Whether you are pregnant or may be pregnant. What are the risks? Generally, this is a safe procedure. However, problems may occur, including:  Bleeding.  Injury to a nerve near the injection site.  Pain at the injection site.  Weakness or numbness in areas controlled by nerves near the injection site.  Infection.  Temporary fluid retention.  Allergic reactions to medicines or dyes.  Injury to other structures or organs near the injection site. What happens before the procedure?  Follow instructions from your health care provider about eating or drinking restrictions.  Ask your health care provider about:  Changing or stopping your regular medicines. This is especially important if you are taking diabetes medicines or blood thinners.  Taking medicines such as aspirin and ibuprofen. These medicines can thin your blood. Do not take these medicines before your procedure if your health care provider instructs you not to.  Do not take any new dietary supplements or medicines without asking your health care provider  first.  Plan to have someone take you home after the procedure. What happens during the procedure?  You may need to remove your clothing and dress in an open-back gown.  The procedure will be done while you are lying on an X-ray table. You will most likely be asked to lie on your stomach, but you may be asked to lie in a different position if an injection will be made in your neck.  Machines will be used to monitor your oxygen levels, heart rate, and blood pressure.  If an injection will be made in your neck, an IV tube will be inserted into one of your veins. Fluids and medicine will flow directly into your body through the IV tube.  The area over the facet joint where the injection will be made will be cleaned with soap. The surrounding skin will be covered with clean drapes.  A numbing medicine (local anesthetic) will be applied to your skin. Your skin may sting or burn for a moment.  A video X-ray machine (fluoroscopy) will be used to locate the joint. In some cases, a CT scan may be used.  A contrast dye may be injected into the facet joint area to help locate the joint.  When the joint is located, an anesthetic will be injected into the joint through the needle.  Your health care provider will ask you whether you feel pain relief. If you do feel relief, a steroid may be injected to provide pain relief for a longer period of time. If you do not feel relief or feel only partial relief, additional injections of an anesthetic may be made in other facet joints.  The needle will be removed.  Your skin will be cleaned.  A bandage (dressing) will be applied over each injection site. The procedure may vary among health care providers and hospitals. What happens after the procedure?  You will be observed for 15-30 minutes before being allowed to go home. This information is not intended to replace advice given to you by your health care provider. Make sure you discuss any questions you  have with your health care provider. Document Released: 05/11/2006 Document Revised: 01/21/2015 Document Reviewed: 09/15/2014 Elsevier Interactive Patient Education  2017 [place]  What are the risk, side effects and possible complications? Generally speaking, most procedures are safe.  However, with any procedure there are risks, side effects, and the possibility of complications.  The risks and complications are dependent upon the sites that are lesioned, or the type of nerve block to be performed.  The closer the procedure is to the spine, the more serious the risks are.  Great care is taken when placing the radio frequency needles, block needles or lesioning probes, but sometimes complications can occur. 1. Infection: Any time there is an injection through the skin, there is a risk of infection.  This is why sterile conditions are used for these blocks.  There are four possible types of infection. 1. Localized skin infection. 2. Central Nervous System Infection-This can be in the form of Meningitis, which can be deadly. 3. Epidural Infections-This can be in the form of an epidural abscess, which can cause pressure inside of the spine, causing compression of the spinal cord with subsequent paralysis. This would require an emergency surgery to decompress, and there are no guarantees that the patient would recover from the paralysis. 4. Discitis-This is an infection of the intervertebral discs.  It occurs in about 1% of discography procedures.  It is difficult to treat and it may lead to surgery.        2. Pain: the needles have to go through skin and soft tissues, will cause soreness.       3. Damage to internal structures:  The nerves to be lesioned may be near blood vessels or    other nerves which can be potentially damaged.       4. Bleeding: Bleeding is more common if the patient is taking blood thinners such as  aspirin, Coumadin, Ticiid, Plavix, etc., or  if he/she have some genetic predisposition  such as hemophilia. Bleeding into the spinal canal can cause compression of the spinal  cord with subsequent paralysis.  This would require an emergency surgery to  decompress and there are no guarantees that the patient would recover from the  paralysis.       5. Pneumothorax:  Puncturing of a lung is a possibility, every time a needle is introduced in  the area of the chest or upper back.  Pneumothorax refers to free air around the  collapsed lung(s), inside of the thoracic cavity (chest cavity).  Another two possible  complications related to a similar event would include: Hemothorax and Chylothorax.   These are variations of the Pneumothorax, where instead of air around the collapsed  lung(s), you may have blood or chyle, respectively.       6. Spinal headaches: They may occur with any procedures in the area of the spine.       7. Persistent CSF (Cerebro-Spinal Fluid) leakage: This is a rare problem, but may occur  with prolonged intrathecal or epidural catheters either due to the formation of a fistulous  track or a dural tear.       8. Nerve damage: By working so close to the spinal cord, there is always a possibility of  nerve damage, which could be as serious as a permanent spinal cord injury with  paralysis.       9. Death:  Although rare, severe deadly allergic reactions known as "Anaphylactic  reaction" can occur to any of the medications used.      10. Worsening of the symptoms:  We can always make thing worse.  What are the chances  of something like this happening? Chances of any of this occuring are extremely low.  By statistics, you have more of a chance of getting killed in a motor vehicle accident: while driving to the hospital than any of the above occurring .  Nevertheless, you should be aware that they are possibilities.  In general, it is similar to taking a shower.  Everybody knows that you can slip, hit your head and get killed.  Does that  mean that you should not shower again?  Nevertheless always keep in mind that statistics do not mean anything if you happen to be on the wrong side of them.  Even if a procedure has a 1 (one) in a 1,000,000 (million) chance of going wrong, it you happen to be that one..Also, keep in mind that by statistics, you have more of a chance of having something go wrong when taking medications.  Who should not have this procedure? If you are on a blood thinning medication (e.g. Coumadin, Plavix, see list of "Blood Thinners"), or if you have an active infection going on, you should not have the procedure.  If you are taking any blood thinners, please inform your physician.  How should I prepare for this procedure?  Do not eat or drink anything at least six hours prior to the procedure.  Bring a driver with you .  It cannot be a taxi.  Come accompanied by an adult that can drive you back, and that is strong enough to help you if your legs get weak or numb from the local anesthetic.  Take all of your medicines the morning of the procedure with just enough water to swallow them.  If you have diabetes, make sure that you are scheduled to have your procedure done first thing in the morning, whenever possible.  If you have diabetes, take only half of your insulin dose and notify our nurse that you have done so as soon as you arrive at the clinic.  If you are diabetic, but only take blood sugar pills (oral hypoglycemic), then do not take them on the morning of your procedure.  You may take them after you have had the procedure.  Do not take aspirin or any aspirin-containing medications, at least eleven (11) days prior to the procedure.  They may prolong bleeding.  Wear loose fitting clothing that may be easy to take off and that you would not mind if it got stained with Betadine or blood.  Do not wear any jewelry or perfume  Remove any nail coloring.  It will interfere with some of our monitoring  equipment.  NOTE: Remember that this is not meant to be interpreted as a complete list of all possible complications.  Unforeseen problems may occur.  BLOOD THINNERS The following drugs contain aspirin or other products, which can cause increased bleeding during surgery and should not be taken for 2 weeks prior to and 1 week after surgery.  If you should need take something for relief of minor pain, you may take acetaminophen which is found in Tylenol,m Datril, Anacin-3 and Panadol. It is not blood thinner. The products listed below are.  Do not take any of the products listed below in addition to any listed on your instruction sheet.  A.P.C or A.P.C with Codeine Codeine Phosphate Capsules #3 Ibuprofen Ridaura  ABC compound Congesprin Imuran rimadil  Advil Cope Indocin Robaxisal  Alka-Seltzer Effervescent Pain Reliever and Antacid Coricidin or Coricidin-D  Indomethacin Rufen  Alka-Seltzer plus Cold Medicine  Cosprin Ketoprofen S-A-C Tablets  Anacin Analgesic Tablets or Capsules Coumadin Korlgesic Salflex  Anacin Extra Strength Analgesic tablets or capsules CP-2 Tablets Lanoril Salicylate  Anaprox Cuprimine Capsules Levenox Salocol  Anexsia-D Dalteparin Magan Salsalate  Anodynos Darvon compound Magnesium Salicylate Sine-off  Ansaid Dasin Capsules Magsal Sodium Salicylate  Anturane Depen Capsules Marnal Soma  APF Arthritis pain formula Dewitt's Pills Measurin Stanback  Argesic Dia-Gesic Meclofenamic Sulfinpyrazone  Arthritis Bayer Timed Release Aspirin Diclofenac Meclomen Sulindac  Arthritis pain formula Anacin Dicumarol Medipren Supac  Analgesic (Safety coated) Arthralgen Diffunasal Mefanamic Suprofen  Arthritis Strength Bufferin Dihydrocodeine Mepro Compound Suprol  Arthropan liquid Dopirydamole Methcarbomol with Aspirin Synalgos  ASA tablets/Enseals Disalcid Micrainin Tagament  Ascriptin Doan's Midol Talwin  Ascriptin A/D Dolene Mobidin Tanderil  Ascriptin Extra Strength Dolobid  Moblgesic Ticlid  Ascriptin with Codeine Doloprin or Doloprin with Codeine Momentum Tolectin  Asperbuf Duoprin Mono-gesic Trendar  Aspergum Duradyne Motrin or Motrin IB Triminicin  Aspirin plain, buffered or enteric coated Durasal Myochrisine Trigesic  Aspirin Suppositories Easprin Nalfon Trillsate  Aspirin with Codeine Ecotrin Regular or Extra Strength Naprosyn Uracel  Atromid-S Efficin Naproxen Ursinus  Auranofin Capsules Elmiron Neocylate Vanquish  Axotal Emagrin Norgesic Verin  Azathioprine Empirin or Empirin with Codeine Normiflo Vitamin E  Azolid Emprazil Nuprin Voltaren  Bayer Aspirin plain, buffered or children's or timed BC Tablets or powders Encaprin Orgaran Warfarin Sodium  Buff-a-Comp Enoxaparin Orudis Zorpin  Buff-a-Comp with Codeine Equegesic Os-Cal-Gesic   Buffaprin Excedrin plain, buffered or Extra Strength Oxalid   Bufferin Arthritis Strength Feldene Oxphenbutazone   Bufferin plain or Extra Strength Feldene Capsules Oxycodone with Aspirin   Bufferin with Codeine Fenoprofen Fenoprofen Pabalate or Pabalate-SF   Buffets II Flogesic Panagesic   Buffinol plain or Extra Strength Florinal or Florinal with Codeine Panwarfarin   Buf-Tabs Flurbiprofen Penicillamine   Butalbital Compound Four-way cold tablets Penicillin   Butazolidin Fragmin Pepto-Bismol   Carbenicillin Geminisyn Percodan   Carna Arthritis Reliever Geopen Persantine   Carprofen Gold's salt Persistin   Chloramphenicol Goody's Phenylbutazone   Chloromycetin Haltrain Piroxlcam   Clmetidine heparin Plaquenil   Cllnoril Hyco-pap Ponstel   Clofibrate Hydroxy chloroquine Propoxyphen         Before stopping any of these medications, be sure to consult the physician who ordered them.  Some, such as Coumadin (Warfarin) are ordered to prevent or treat serious conditions such as "deep thrombosis", "pumonary embolisms", and other heart problems.  The amount of time that you may need off of the medication may also vary with  the medication and the reason for which you were taking it.  If you are taking any of these medications, please make sure you notify your pain physician before you undergo any procedures.

## 2016-04-21 NOTE — Progress Notes (Signed)
Nursing Pain Medication Assessment:  Safety precautions to be maintained throughout the outpatient stay will include: orient to surroundings, keep bed in low position, maintain call bell within reach at all times, provide assistance with transfer out of bed and ambulation.  Medication Inspection Compliance: Pill count conducted under aseptic conditions, in front of the patient. Neither the pills nor the bottle was removed from the patient's sight at any time. Once count was completed pills were immediately returned to the patient in their original bottle. Pill Count: 0 of 7 pills remain Bottle Appearance: Standard pharmacy container. Clearly labeled. Medication: Oxycodone IR Filled Date: 04 / 5 / 2018

## 2016-04-21 NOTE — Patient Instructions (Addendum)
Preparing for Procedure with Sedation Instructions: . Oral Intake: Do not eat or drink anything for at least 8 hours prior to your procedure. . Transportation: Public transportation is not allowed. Bring an adult driver. The driver must be physically present in our waiting room before any procedure can be started. Marland Kitchen Physical Assistance: Bring an adult physically capable of assisting you, in the event you need help. This adult should keep you company at home for at least 6 hours after the procedure. . Blood Pressure Medicine: Take your blood pressure medicine with a sip of water the morning of the procedure. . Blood thinners:  . Diabetics on insulin: Notify the staff so that you can be scheduled 1st case in the morning. If your diabetes requires high dose insulin, take only  of your normal insulin dose the morning of the procedure and notify the staff that you have done so. . Preventing infections: Shower with an antibacterial soap the morning of your procedure. . Build-up your immune system: Take 1000 mg of Vitamin C with every meal (3 times a day) the day prior to your procedure. Marland Kitchen Antibiotics: Inform the staff if you have a condition or reason that requires you to take antibiotics before dental procedures. . Pregnancy: If you are pregnant, call and cancel the procedure. . Sickness: If you have a cold, fever, or any active infections, call and cancel the procedure. . Arrival: You must be in the facility at least 30 minutes prior to your scheduled procedure. . Children: Do not bring children with you. . Dress appropriately: Bring dark clothing that you would not mind if they get stained. . Valuables: Do not bring any jewelry or valuables. Procedure appointments are reserved for interventional treatments only. Marland Kitchen No Prescription Refills. . No medication changes will be discussed during procedure appointments. . No disability issues will be  discussed.  ____________________________________________________________________________________________   Facet Joint Block The facet joints connect the bones of the spine (vertebrae). They make it possible for you to bend, twist, and make other movements with your spine. They also keep you from bending too far, twisting too far, and making other excessive movements. A facet joint block is a procedure where a numbing medicine (anesthetic) is injected into a facet joint. Often, a type of anti-inflammatory medicine called a steroid is also injected. A facet joint block may be done to diagnose neck or back pain. If the pain gets better after a facet joint block, it means the pain is probably coming from the facet joint. If the pain does not get better, it means the pain is probably not coming from the facet joint. A facet joint block may also be done to relieve neck or back pain caused by an inflamed facet joint. A facet joint block is only done to relieve pain if the pain does not improve with other methods, such as medicine, exercise programs, and physical therapy. Tell a health care provider about:  Any allergies you have.  All medicines you are taking, including vitamins, herbs, eye drops, creams, and over-the-counter medicines.  Any problems you or family members have had with anesthetic medicines.  Any blood disorders you have.  Any surgeries you have had.  Any medical conditions you have.  Whether you are pregnant or may be pregnant. What are the risks? Generally, this is a safe procedure. However, problems may occur, including:  Bleeding.  Injury to a nerve near the injection site.  Pain at the injection site.  Weakness or numbness in  areas controlled by nerves near the injection site.  Infection.  Temporary fluid retention.  Allergic reactions to medicines or dyes.  Injury to other structures or organs near the injection site. What happens before the  procedure?  Follow instructions from your health care provider about eating or drinking restrictions.  Ask your health care provider about:  Changing or stopping your regular medicines. This is especially important if you are taking diabetes medicines or blood thinners.  Taking medicines such as aspirin and ibuprofen. These medicines can thin your blood. Do not take these medicines before your procedure if your health care provider instructs you not to.  Do not take any new dietary supplements or medicines without asking your health care provider first.  Plan to have someone take you home after the procedure. What happens during the procedure?  You may need to remove your clothing and dress in an open-back gown.  The procedure will be done while you are lying on an X-ray table. You will most likely be asked to lie on your stomach, but you may be asked to lie in a different position if an injection will be made in your neck.  Machines will be used to monitor your oxygen levels, heart rate, and blood pressure.  If an injection will be made in your neck, an IV tube will be inserted into one of your veins. Fluids and medicine will flow directly into your body through the IV tube.  The area over the facet joint where the injection will be made will be cleaned with soap. The surrounding skin will be covered with clean drapes.  A numbing medicine (local anesthetic) will be applied to your skin. Your skin may sting or burn for a moment.  A video X-ray machine (fluoroscopy) will be used to locate the joint. In some cases, a CT scan may be used.  A contrast dye may be injected into the facet joint area to help locate the joint.  When the joint is located, an anesthetic will be injected into the joint through the needle.  Your health care provider will ask you whether you feel pain relief. If you do feel relief, a steroid may be injected to provide pain relief for a longer period of time. If  you do not feel relief or feel only partial relief, additional injections of an anesthetic may be made in other facet joints.  The needle will be removed.  Your skin will be cleaned.  A bandage (dressing) will be applied over each injection site. The procedure may vary among health care providers and hospitals. What happens after the procedure?  You will be observed for 15-30 minutes before being allowed to go home. This information is not intended to replace advice given to you by your health care provider. Make sure you discuss any questions you have with your health care provider. Document Released: 05/11/2006 Document Revised: 01/21/2015 Document Reviewed: 09/15/2014 Elsevier Interactive Patient Education  2017 [place]  What are the risk, side effects and possible complications? Generally speaking, most procedures are safe.  However, with any procedure there are risks, side effects, and the possibility of complications.  The risks and complications are dependent upon the sites that are lesioned, or the type of nerve block to be performed.  The closer the procedure is to the spine, the more serious the risks are.  Great care is taken when placing the radio frequency needles, block needles or lesioning probes, but sometimes complications  can occur. 1. Infection: Any time there is an injection through the skin, there is a risk of infection.  This is why sterile conditions are used for these blocks.  There are four possible types of infection. 1. Localized skin infection. 2. Central Nervous System Infection-This can be in the form of Meningitis, which can be deadly. 3. Epidural Infections-This can be in the form of an epidural abscess, which can cause pressure inside of the spine, causing compression of the spinal cord with subsequent paralysis. This would require an emergency surgery to decompress, and there are no guarantees that the patient would recover  from the paralysis. 4. Discitis-This is an infection of the intervertebral discs.  It occurs in about 1% of discography procedures.  It is difficult to treat and it may lead to surgery.        2. Pain: the needles have to go through skin and soft tissues, will cause soreness.       3. Damage to internal structures:  The nerves to be lesioned may be near blood vessels or    other nerves which can be potentially damaged.       4. Bleeding: Bleeding is more common if the patient is taking blood thinners such as  aspirin, Coumadin, Ticiid, Plavix, etc., or if he/she have some genetic predisposition  such as hemophilia. Bleeding into the spinal canal can cause compression of the spinal  cord with subsequent paralysis.  This would require an emergency surgery to  decompress and there are no guarantees that the patient would recover from the  paralysis.       5. Pneumothorax:  Puncturing of a lung is a possibility, every time a needle is introduced in  the area of the chest or upper back.  Pneumothorax refers to free air around the  collapsed lung(s), inside of the thoracic cavity (chest cavity).  Another two possible  complications related to a similar event would include: Hemothorax and Chylothorax.   These are variations of the Pneumothorax, where instead of air around the collapsed  lung(s), you may have blood or chyle, respectively.       6. Spinal headaches: They may occur with any procedures in the area of the spine.       7. Persistent CSF (Cerebro-Spinal Fluid) leakage: This is a rare problem, but may occur  with prolonged intrathecal or epidural catheters either due to the formation of a fistulous  track or a dural tear.       8. Nerve damage: By working so close to the spinal cord, there is always a possibility of  nerve damage, which could be as serious as a permanent spinal cord injury with  paralysis.       9. Death:  Although rare, severe deadly allergic reactions known as "Anaphylactic  reaction"  can occur to any of the medications used.      10. Worsening of the symptoms:  We can always make thing worse.  What are the chances of something like this happening? Chances of any of this occuring are extremely low.  By statistics, you have more of a chance of getting killed in a motor vehicle accident: while driving to the hospital than any of the above occurring .  Nevertheless, you should be aware that they are possibilities.  In general, it is similar to taking a shower.  Everybody knows that you can slip, hit your head and get killed.  Does that mean that you should not shower  again?  Nevertheless always keep in mind that statistics do not mean anything if you happen to be on the wrong side of them.  Even if a procedure has a 1 (one) in a 1,000,000 (million) chance of going wrong, it you happen to be that one..Also, keep in mind that by statistics, you have more of a chance of having something go wrong when taking medications.  Who should not have this procedure? If you are on a blood thinning medication (e.g. Coumadin, Plavix, see list of "Blood Thinners"), or if you have an active infection going on, you should not have the procedure.  If you are taking any blood thinners, please inform your physician.  How should I prepare for this procedure?  Do not eat or drink anything at least six hours prior to the procedure.  Bring a driver with you .  It cannot be a taxi.  Come accompanied by an adult that can drive you back, and that is strong enough to help you if your legs get weak or numb from the local anesthetic.  Take all of your medicines the morning of the procedure with just enough water to swallow them.  If you have diabetes, make sure that you are scheduled to have your procedure done first thing in the morning, whenever possible.  If you have diabetes, take only half of your insulin dose and notify our nurse that you have done so as soon as you arrive at the clinic.  If you are  diabetic, but only take blood sugar pills (oral hypoglycemic), then do not take them on the morning of your procedure.  You may take them after you have had the procedure.  Do not take aspirin or any aspirin-containing medications, at least eleven (11) days prior to the procedure.  They may prolong bleeding.  Wear loose fitting clothing that may be easy to take off and that you would not mind if it got stained with Betadine or blood.  Do not wear any jewelry or perfume  Remove any nail coloring.  It will interfere with some of our monitoring equipment.  NOTE: Remember that this is not meant to be interpreted as a complete list of all possible complications.  Unforeseen problems may occur.  BLOOD THINNERS The following drugs contain aspirin or other products, which can cause increased bleeding during surgery and should not be taken for 2 weeks prior to and 1 week after surgery.  If you should need take something for relief of minor pain, you may take acetaminophen which is found in Tylenol,m Datril, Anacin-3 and Panadol. It is not blood thinner. The products listed below are.  Do not take any of the products listed below in addition to any listed on your instruction sheet.  A.P.C or A.P.C with Codeine Codeine Phosphate Capsules #3 Ibuprofen Ridaura  ABC compound Congesprin Imuran rimadil  Advil Cope Indocin Robaxisal  Alka-Seltzer Effervescent Pain Reliever and Antacid Coricidin or Coricidin-D  Indomethacin Rufen  Alka-Seltzer plus Cold Medicine Cosprin Ketoprofen S-A-C Tablets  Anacin Analgesic Tablets or Capsules Coumadin Korlgesic Salflex  Anacin Extra Strength Analgesic tablets or capsules CP-2 Tablets Lanoril Salicylate  Anaprox Cuprimine Capsules Levenox Salocol  Anexsia-D Dalteparin Magan Salsalate  Anodynos Darvon compound Magnesium Salicylate Sine-off  Ansaid Dasin Capsules Magsal Sodium Salicylate  Anturane Depen Capsules Marnal Soma  APF Arthritis pain formula Dewitt's Pills  Measurin Stanback  Argesic Dia-Gesic Meclofenamic Sulfinpyrazone  Arthritis Bayer Timed Release Aspirin Diclofenac Meclomen Sulindac  Arthritis pain formula Anacin Dicumarol  Medipren Supac  Analgesic (Safety coated) Arthralgen Diffunasal Mefanamic Suprofen  Arthritis Strength Bufferin Dihydrocodeine Mepro Compound Suprol  Arthropan liquid Dopirydamole Methcarbomol with Aspirin Synalgos  ASA tablets/Enseals Disalcid Micrainin Tagament  Ascriptin Doan's Midol Talwin  Ascriptin A/D Dolene Mobidin Tanderil  Ascriptin Extra Strength Dolobid Moblgesic Ticlid  Ascriptin with Codeine Doloprin or Doloprin with Codeine Momentum Tolectin  Asperbuf Duoprin Mono-gesic Trendar  Aspergum Duradyne Motrin or Motrin IB Triminicin  Aspirin plain, buffered or enteric coated Durasal Myochrisine Trigesic  Aspirin Suppositories Easprin Nalfon Trillsate  Aspirin with Codeine Ecotrin Regular or Extra Strength Naprosyn Uracel  Atromid-S Efficin Naproxen Ursinus  Auranofin Capsules Elmiron Neocylate Vanquish  Axotal Emagrin Norgesic Verin  Azathioprine Empirin or Empirin with Codeine Normiflo Vitamin E  Azolid Emprazil Nuprin Voltaren  Bayer Aspirin plain, buffered or children's or timed BC Tablets or powders Encaprin Orgaran Warfarin Sodium  Buff-a-Comp Enoxaparin Orudis Zorpin  Buff-a-Comp with Codeine Equegesic Os-Cal-Gesic   Buffaprin Excedrin plain, buffered or Extra Strength Oxalid   Bufferin Arthritis Strength Feldene Oxphenbutazone   Bufferin plain or Extra Strength Feldene Capsules Oxycodone with Aspirin   Bufferin with Codeine Fenoprofen Fenoprofen Pabalate or Pabalate-SF   Buffets II Flogesic Panagesic   Buffinol plain or Extra Strength Florinal or Florinal with Codeine Panwarfarin   Buf-Tabs Flurbiprofen Penicillamine   Butalbital Compound Four-way cold tablets Penicillin   Butazolidin Fragmin Pepto-Bismol   Carbenicillin Geminisyn Percodan   Carna Arthritis Reliever Geopen Persantine    Carprofen Gold's salt Persistin   Chloramphenicol Goody's Phenylbutazone   Chloromycetin Haltrain Piroxlcam   Clmetidine heparin Plaquenil   Cllnoril Hyco-pap Ponstel   Clofibrate Hydroxy chloroquine Propoxyphen         Before stopping any of these medications, be sure to consult the physician who ordered them.  Some, such as Coumadin (Warfarin) are ordered to prevent or treat serious conditions such as "deep thrombosis", "pumonary embolisms", and other heart problems.  The amount of time that you may need off of the medication may also vary with the medication and the reason for which you were taking it.  If you are taking any of these medications, please make sure you notify your pain physician before you undergo any procedures.

## 2016-04-25 ENCOUNTER — Telehealth: Payer: Self-pay

## 2016-04-25 NOTE — Telephone Encounter (Signed)
Pt would like to know the results of her x rays  [phone]

## 2016-04-26 ENCOUNTER — Telehealth: Payer: Self-pay | Admitting: *Deleted

## 2016-05-04 ENCOUNTER — Ambulatory Visit: Payer: Self-pay | Admitting: Pain Medicine

## 2016-05-12 ENCOUNTER — Encounter: Payer: Self-pay | Admitting: Pain Medicine

## 2016-05-12 ENCOUNTER — Ambulatory Visit: Payer: Self-pay | Admitting: Nurse Practitioner

## 2016-05-16 ENCOUNTER — Ambulatory Visit (HOSPITAL_BASED_OUTPATIENT_CLINIC_OR_DEPARTMENT_OTHER): Payer: Medicaid Other | Admitting: Pain Medicine

## 2016-05-16 ENCOUNTER — Encounter: Payer: Self-pay | Admitting: Pain Medicine

## 2016-05-16 ENCOUNTER — Ambulatory Visit
Admission: RE | Admit: 2016-05-16 | Discharge: 2016-05-16 | Disposition: A | Discharge status: Home / Self Care | Payer: Medicaid Other | Source: Ambulatory Visit | Attending: Pain Medicine | Admitting: Pain Medicine

## 2016-05-16 VITALS — BP 112/70 | HR 108 | Temp 97.2°F | Resp 21 | Ht 62.0 in | Wt 260.0 lb

## 2016-05-16 DIAGNOSIS — M5442 Lumbago with sciatica, left side: Secondary | ICD-10-CM | POA: Diagnosis not present

## 2016-05-16 DIAGNOSIS — M5441 Lumbago with sciatica, right side: Secondary | ICD-10-CM

## 2016-05-16 DIAGNOSIS — G8929 Other chronic pain: Secondary | ICD-10-CM

## 2016-05-16 DIAGNOSIS — M4696 Unspecified inflammatory spondylopathy, lumbar region: Secondary | ICD-10-CM

## 2016-05-16 DIAGNOSIS — M545 Low back pain: Secondary | ICD-10-CM | POA: Insufficient documentation

## 2016-05-16 DIAGNOSIS — Z9049 Acquired absence of other specified parts of digestive tract: Secondary | ICD-10-CM | POA: Diagnosis not present

## 2016-05-16 DIAGNOSIS — M47816 Spondylosis without myelopathy or radiculopathy, lumbar region: Secondary | ICD-10-CM

## 2016-05-16 DIAGNOSIS — M533 Sacrococcygeal disorders, not elsewhere classified: Secondary | ICD-10-CM | POA: Diagnosis not present

## 2016-05-16 LAB — GLUCOSE, CAPILLARY: Glucose-Capillary: 209 mg/dL — ABNORMAL HIGH (ref 65–99)

## 2016-05-16 MED ORDER — LIDOCAINE HCL (PF) 1 % IJ SOLN
10.0000 mL | Freq: Once | INTRAMUSCULAR | Status: AC
  Administered 2016-05-16: 10 mL

## 2016-05-16 MED ORDER — ROPIVACAINE HCL 2 MG/ML IJ SOLN
25.0000 mL | Freq: Once | INTRAMUSCULAR | Status: AC
  Administered 2016-05-16: 10 mL via PERINEURAL

## 2016-05-16 MED ORDER — MIDAZOLAM HCL 5 MG/5ML IJ SOLN: 1.0000 mg | INTRAMUSCULAR | Status: DC | PRN

## 2016-05-16 MED ORDER — LIDOCAINE HCL (PF) 1 % IJ SOLN
INTRAMUSCULAR | Status: AC
  Filled 2016-05-16: qty 10

## 2016-05-16 MED ORDER — MIDAZOLAM HCL 5 MG/5ML IJ SOLN
INTRAMUSCULAR | Status: AC
  Filled 2016-05-16: qty 5

## 2016-05-16 MED ORDER — FENTANYL CITRATE (PF) 100 MCG/2ML IJ SOLN
INTRAMUSCULAR | Status: AC
  Filled 2016-05-16: qty 2

## 2016-05-16 MED ORDER — ROPIVACAINE HCL 2 MG/ML IJ SOLN
INTRAMUSCULAR | Status: AC
  Filled 2016-05-16: qty 10

## 2016-05-16 MED ORDER — LACTATED RINGERS IV SOLN: 1000.0000 mL | Freq: Once | INTRAVENOUS | Status: DC

## 2016-05-16 MED ORDER — FENTANYL CITRATE (PF) 100 MCG/2ML IJ SOLN
25.0000 ug | INTRAMUSCULAR | Status: DC | PRN
  Administered 2016-05-16: 100 ug via INTRAVENOUS

## 2016-05-16 MED ORDER — ROPIVACAINE HCL 2 MG/ML IJ SOLN
INTRAMUSCULAR | Status: AC
  Filled 2016-05-16: qty 30

## 2016-05-16 NOTE — Patient Instructions (Addendum)
Pain Management Discharge Instructions  General Discharge Instructions :  If you need to reach your doctor call: Monday-Friday 8:00 am - 4:00 pm at [phone] or toll free [phone].  After clinic hours [phone] to have operator reach doctor.  Bring all of your medication bottles to all your appointments in the pain clinic.  To cancel or reschedule your appointment with Pain Management please remember to call 24 hours in advance to avoid a fee.  Refer to the educational materials which you have been given on: General Risks, I had my Procedure. Discharge Instructions, Post Sedation.  Post Procedure Instructions:  The drugs you were given will stay in your system until tomorrow, so for the next 24 hours you should not drive, make any legal decisions or drink any alcoholic beverages.  You may eat anything you prefer, but it is better to start with liquids then soups and crackers, and gradually work up to solid foods.  Please notify your doctor immediately if you have any unusual bleeding, trouble breathing or pain that is not related to your normal pain.  Depending on the type of procedure that was done, some parts of your body may feel week and/or numb.  This usually clears up by tonight or the next day.  Walk with the use of an assistive device or accompanied by an adult for the 24 hours.  You may use ice on the affected area for the first 24 hours.  Put ice in a Ziploc bag and cover with a towel and place against area 15 minutes on 15 minutes off.  You may switch to heat after 24 hours.GENERAL RISKS AND COMPLICATIONS  What are the risk, side effects and possible complications? Generally speaking, most procedures are safe.  However, with any procedure there are risks, side effects, and the possibility of complications.  The risks and complications are dependent upon the sites that are lesioned, or the type of nerve block to be performed.  The closer the procedure is to the spine,  the more serious the risks are.  Great care is taken when placing the radio frequency needles, block needles or lesioning probes, but sometimes complications can occur. 1. Infection: Any time there is an injection through the skin, there is a risk of infection.  This is why sterile conditions are used for these blocks.  There are four possible types of infection. 1. Localized skin infection. 2. Central Nervous System Infection-This can be in the form of Meningitis, which can be deadly. 3. Epidural Infections-This can be in the form of an epidural abscess, which can cause pressure inside of the spine, causing compression of the spinal cord with subsequent paralysis. This would require an emergency surgery to decompress, and there are no guarantees that the patient would recover from the paralysis. 4. Discitis-This is an infection of the intervertebral discs.  It occurs in about 1% of discography procedures.  It is difficult to treat and it may lead to surgery.        2. Pain: the needles have to go through skin and soft tissues, will cause soreness.       3. Damage to internal structures:  The nerves to be lesioned may be near blood vessels or    other nerves which can be potentially damaged.       4. Bleeding: Bleeding is more common if the patient is taking blood thinners such as  aspirin, Coumadin, Ticiid, Plavix, etc., or if he/she have some genetic predisposition  such as  hemophilia. Bleeding into the spinal canal can cause compression of the spinal  cord with subsequent paralysis.  This would require an emergency surgery to  decompress and there are no guarantees that the patient would recover from the  paralysis.       5. Pneumothorax:  Puncturing of a lung is a possibility, every time a needle is introduced in  the area of the chest or upper back.  Pneumothorax refers to free air around the  collapsed lung(s), inside of the thoracic cavity (chest cavity).  Another two possible  complications  related to a similar event would include: Hemothorax and Chylothorax.   These are variations of the Pneumothorax, where instead of air around the collapsed  lung(s), you may have blood or chyle, respectively.       6. Spinal headaches: They may occur with any procedures in the area of the spine.       7. Persistent CSF (Cerebro-Spinal Fluid) leakage: This is a rare problem, but may occur  with prolonged intrathecal or epidural catheters either due to the formation of a fistulous  track or a dural tear.       8. Nerve damage: By working so close to the spinal cord, there is always a possibility of  nerve damage, which could be as serious as a permanent spinal cord injury with  paralysis.       9. Death:  Although rare, severe deadly allergic reactions known as "Anaphylactic  reaction" can occur to any of the medications used.      10. Worsening of the symptoms:  We can always make thing worse.  What are the chances of something like this happening? Chances of any of this occuring are extremely low.  By statistics, you have more of a chance of getting killed in a motor vehicle accident: while driving to the hospital than any of the above occurring .  Nevertheless, you should be aware that they are possibilities.  In general, it is similar to taking a shower.  Everybody knows that you can slip, hit your head and get killed.  Does that mean that you should not shower again?  Nevertheless always keep in mind that statistics do not mean anything if you happen to be on the wrong side of them.  Even if a procedure has a 1 (one) in a 1,000,000 (million) chance of going wrong, it you happen to be that one..Also, keep in mind that by statistics, you have more of a chance of having something go wrong when taking medications.  Who should not have this procedure? If you are on a blood thinning medication (e.g. Coumadin, Plavix, see list of "Blood Thinners"), or if you have an active infection going on, you should not  have the procedure.  If you are taking any blood thinners, please inform your physician.  How should I prepare for this procedure?  Do not eat or drink anything at least six hours prior to the procedure.  Bring a driver with you .  It cannot be a taxi.  Come accompanied by an adult that can drive you back, and that is strong enough to help you if your legs get weak or numb from the local anesthetic.  Take all of your medicines the morning of the procedure with just enough water to swallow them.  If you have diabetes, make sure that you are scheduled to have your procedure done first thing in the morning, whenever possible.  If you have diabetes,  take only half of your insulin dose and notify our nurse that you have done so as soon as you arrive at the clinic.  If you are diabetic, but only take blood sugar pills (oral hypoglycemic), then do not take them on the morning of your procedure.  You may take them after you have had the procedure.  Do not take aspirin or any aspirin-containing medications, at least eleven (11) days prior to the procedure.  They may prolong bleeding.  Wear loose fitting clothing that may be easy to take off and that you would not mind if it got stained with Betadine or blood.  Do not wear any jewelry or perfume  Remove any nail coloring.  It will interfere with some of our monitoring equipment.  NOTE: Remember that this is not meant to be interpreted as a complete list of all possible complications.  Unforeseen problems may occur.  BLOOD THINNERS The following drugs contain aspirin or other products, which can cause increased bleeding during surgery and should not be taken for 2 weeks prior to and 1 week after surgery.  If you should need take something for relief of minor pain, you may take acetaminophen which is found in Tylenol,m Datril, Anacin-3 and Panadol. It is not blood thinner. The products listed below are.  Do not take any of the products listed below  in addition to any listed on your instruction sheet.  A.P.C or A.P.C with Codeine Codeine Phosphate Capsules #3 Ibuprofen Ridaura  ABC compound Congesprin Imuran rimadil  Advil Cope Indocin Robaxisal  Alka-Seltzer Effervescent Pain Reliever and Antacid Coricidin or Coricidin-D  Indomethacin Rufen  Alka-Seltzer plus Cold Medicine Cosprin Ketoprofen S-A-C Tablets  Anacin Analgesic Tablets or Capsules Coumadin Korlgesic Salflex  Anacin Extra Strength Analgesic tablets or capsules CP-2 Tablets Lanoril Salicylate  Anaprox Cuprimine Capsules Levenox Salocol  Anexsia-D Dalteparin Magan Salsalate  Anodynos Darvon compound Magnesium Salicylate Sine-off  Ansaid Dasin Capsules Magsal Sodium Salicylate  Anturane Depen Capsules Marnal Soma  APF Arthritis pain formula Dewitt's Pills Measurin Stanback  Argesic Dia-Gesic Meclofenamic Sulfinpyrazone  Arthritis Bayer Timed Release Aspirin Diclofenac Meclomen Sulindac  Arthritis pain formula Anacin Dicumarol Medipren Supac  Analgesic (Safety coated) Arthralgen Diffunasal Mefanamic Suprofen  Arthritis Strength Bufferin Dihydrocodeine Mepro Compound Suprol  Arthropan liquid Dopirydamole Methcarbomol with Aspirin Synalgos  ASA tablets/Enseals Disalcid Micrainin Tagament  Ascriptin Doan's Midol Talwin  Ascriptin A/D Dolene Mobidin Tanderil  Ascriptin Extra Strength Dolobid Moblgesic Ticlid  Ascriptin with Codeine Doloprin or Doloprin with Codeine Momentum Tolectin  Asperbuf Duoprin Mono-gesic Trendar  Aspergum Duradyne Motrin or Motrin IB Triminicin  Aspirin plain, buffered or enteric coated Durasal Myochrisine Trigesic  Aspirin Suppositories Easprin Nalfon Trillsate  Aspirin with Codeine Ecotrin Regular or Extra Strength Naprosyn Uracel  Atromid-S Efficin Naproxen Ursinus  Auranofin Capsules Elmiron Neocylate Vanquish  Axotal Emagrin Norgesic Verin  Azathioprine Empirin or Empirin with Codeine Normiflo Vitamin E  Azolid Emprazil Nuprin Voltaren  Bayer  Aspirin plain, buffered or children's or timed BC Tablets or powders Encaprin Orgaran Warfarin Sodium  Buff-a-Comp Enoxaparin Orudis Zorpin  Buff-a-Comp with Codeine Equegesic Os-Cal-Gesic   Buffaprin Excedrin plain, buffered or Extra Strength Oxalid   Bufferin Arthritis Strength Feldene Oxphenbutazone   Bufferin plain or Extra Strength Feldene Capsules Oxycodone with Aspirin   Bufferin with Codeine Fenoprofen Fenoprofen Pabalate or Pabalate-SF   Buffets II Flogesic Panagesic   Buffinol plain or Extra Strength Florinal or Florinal with Codeine Panwarfarin   Buf-Tabs Flurbiprofen Penicillamine   Butalbital Compound Four-way cold tablets  Penicillin   Butazolidin Fragmin Pepto-Bismol   Carbenicillin Geminisyn Percodan   Carna Arthritis Reliever Geopen Persantine   Carprofen Gold's salt Persistin   Chloramphenicol Goody's Phenylbutazone   Chloromycetin Haltrain Piroxlcam   Clmetidine heparin Plaquenil   Cllnoril Hyco-pap Ponstel   Clofibrate Hydroxy chloroquine Propoxyphen         Before stopping any of these medications, be sure to consult the physician who ordered them.  Some, such as Coumadin (Warfarin) are ordered to prevent or treat serious conditions such as "deep thrombosis", "pumonary embolisms", and other heart problems.  The amount of time that you may need off of the medication may also vary with the medication and the reason for which you were taking it.  If you are taking any of these medications, please make sure you notify your pain physician before you undergo any procedures.         Facet Joint Block, Care After Refer to this sheet in the next few weeks. These instructions provide you with information about caring for yourself after your procedure. Your health care provider may also give you more specific instructions. Your treatment has been planned according to current medical practices, but problems sometimes occur. Call your health care provider if you have any  problems or questions after your procedure. What can I expect after the procedure? After the procedure, it is common to have:  Some tenderness over the injection sites for 2 days after the procedure.  A temporary increase in blood sugar if you have diabetes. Follow these instructions at home:  Keep track of the amount of pain relief you feel and how long it lasts.  Take over-the-counter and prescription medicines only as told by your health care provider. You may need to limit pain medicine within the first 4-6 hours after the procedure.  Remove your bandages (dressings) the morning after the procedure.  For the first 24 hours after the procedure:  Do not apply heat near or over the injection sites.  Do not take a bath or soak in water, such as in a pool or lake.  Do not drive or operate heavy machinery unless approved by your health care provider.  Avoid activities that require a lot of energy.  If the injection site is tender, try applying ice to the area. To do this:  Put ice in a plastic bag.  Place a towel between your skin and the bag.  Leave the ice on for 20 minutes, 2-3 times a day.  Keep all follow-up visits as told by your health care provider. This is important. Contact a health care provider if:  Fluid is coming from an injection site.  There is significant bleeding or swelling at an injection site.  You have diabetes and your blood sugar is above 180 mg/dL. Get help right away if:  You have a fever.  You have worsening pain or swelling around an injection site.  There are red streaks around an injection site.  You develop severe pain that is not controlled by your medicines.  You develop a headache, stiff neck, nausea, or vomiting.  Your eyes become very sensitive to light.  You have weakness, paralysis, or tingling in your arms or legs that was not present before the procedure.  You have difficulty urinating or breathing. This information is  not intended to replace advice given to you by your health care provider. Make sure you discuss any questions you have with your health care provider. Document Released: 12/07/2011 Document  Revised: 05/06/2015 Document Reviewed: 09/15/2014 Elsevier Interactive Patient Education  2017 [employer] Facet Joint Block The facet joints connect the bones of the spine (vertebrae). They make it possible for you to bend, twist, and make other movements with your spine. They also keep you from bending too far, twisting too far, and making other excessive movements. A facet joint block is a procedure where a numbing medicine (anesthetic) is injected into a facet joint. Often, a type of anti-inflammatory medicine called a steroid is also injected. A facet joint block may be done to diagnose neck or back pain. If the pain gets better after a facet joint block, it means the pain is probably coming from the facet joint. If the pain does not get better, it means the pain is probably not coming from the facet joint. A facet joint block may also be done to relieve neck or back pain caused by an inflamed facet joint. A facet joint block is only done to relieve pain if the pain does not improve with other methods, such as medicine, exercise programs, and physical therapy. Tell a health care provider about:  Any allergies you have.  All medicines you are taking, including vitamins, herbs, eye drops, creams, and over-the-counter medicines.  Any problems you or family members have had with anesthetic medicines.  Any blood disorders you have.  Any surgeries you have had.  Any medical conditions you have.  Whether you are pregnant or may be pregnant. What are the risks? Generally, this is a safe procedure. However, problems may occur, including:  Bleeding.  Injury to a nerve near the injection site.  Pain at the injection site.  Weakness or numbness in areas controlled by nerves near the injection  site.  Infection.  Temporary fluid retention.  Allergic reactions to medicines or dyes.  Injury to other structures or organs near the injection site. What happens before the procedure?  Follow instructions from your health care provider about eating or drinking restrictions.  Ask your health care provider about:  Changing or stopping your regular medicines. This is especially important if you are taking diabetes medicines or blood thinners.  Taking medicines such as aspirin and ibuprofen. These medicines can thin your blood. Do not take these medicines before your procedure if your health care provider instructs you not to.  Do not take any new dietary supplements or medicines without asking your health care provider first.  Plan to have someone take you home after the procedure. What happens during the procedure?  You may need to remove your clothing and dress in an open-back gown.  The procedure will be done while you are lying on an X-ray table. You will most likely be asked to lie on your stomach, but you may be asked to lie in a different position if an injection will be made in your neck.  Machines will be used to monitor your oxygen levels, heart rate, and blood pressure.  If an injection will be made in your neck, an IV tube will be inserted into one of your veins. Fluids and medicine will flow directly into your body through the IV tube.  The area over the facet joint where the injection will be made will be cleaned with soap. The surrounding skin will be covered with clean drapes.  A numbing medicine (local anesthetic) will be applied to your skin. Your skin may sting or burn for a moment.  A video X-ray machine (fluoroscopy) will be used to locate the  joint. In some cases, a CT scan may be used.  A contrast dye may be injected into the facet joint area to help locate the joint.  When the joint is located, an anesthetic will be injected into the joint through the  needle.  Your health care provider will ask you whether you feel pain relief. If you do feel relief, a steroid may be injected to provide pain relief for a longer period of time. If you do not feel relief or feel only partial relief, additional injections of an anesthetic may be made in other facet joints.  The needle will be removed.  Your skin will be cleaned.  A bandage (dressing) will be applied over each injection site. The procedure may vary among health care providers and hospitals. What happens after the procedure?  You will be observed for 15-30 minutes before being allowed to go home. This information is not intended to replace advice given to you by your health care provider. Make sure you discuss any questions you have with your health care provider. Document Released: 05/11/2006 Document Revised: 01/21/2015 Document Reviewed: 09/15/2014 Elsevier Interactive Patient Education  2017 [place]. Post-Procedure instructions Instructions:  Apply ice: Fill a plastic sandwich bag with crushed ice. Cover it with a small towel and apply to injection site. Apply for 15 minutes then remove x 15 minutes. Repeat sequence on day of procedure, until you go to bed. The purpose is to minimize swelling and discomfort after procedure.  Apply heat: Apply heat to procedure site starting the day following the procedure. The purpose is to treat any soreness and discomfort from the procedure.  Food intake: Start with clear liquids (like water) and advance to regular food, as tolerated.   Physical activities: Keep activities to a minimum for the first 8 hours after the procedure.   Driving: If you have received any sedation, you are not allowed to drive for 24 hours after your procedure.  Blood thinner: Restart your blood thinner 6 hours after your procedure. (Only for those taking blood thinners)  Insulin: As soon as you can eat, you may resume your normal dosing schedule. (Only for those  taking insulin)  Infection prevention: Keep procedure site clean and dry.  Post-procedure Pain Diary: Extremely important that this be done correctly and accurately. Recorded information will be used to determine the next step in treatment.  Pain evaluated is that of treated area only. Do not include pain from an untreated area.  Complete every hour, on the hour, for the initial 8 hours. Set an alarm to help you do this part accurately.  Do not go to sleep and have it completed later. It will not be accurate.  Follow-up appointment: Keep your follow-up appointment after the procedure. Usually 2 weeks for most procedures. (6 weeks in the case of radiofrequency.) Bring you pain diary.  Expect:  From numbing medicine (AKA: Local Anesthetics): Numbness or decrease in pain.  Onset: Full effect within 15 minutes of injected.  Duration: It will depend on the type of local anesthetic used. On the average, 1 to 8 hours.   From steroids: Decrease in swelling or inflammation. Once inflammation is improved, relief of the pain will follow.  Onset of benefits: Depends on the amount of swelling present. The more swelling, the longer it will take for the benefits to be seen.   Duration: Steroids will stay in the system x 2 weeks. Duration of benefits will depend on multiple posibilities including persistent irritating factors.  From procedure: Some discomfort is to be expected once the numbing medicine wears off. This should be minimal if ice and heat are applied as instructed. Call if:  You experience numbness and weakness that gets worse with time, as opposed to wearing off.  New onset bowel or bladder incontinence. (Spinal procedures only)  Emergency Numbers:  Cool hours (Monday - Thursday, 8:00 AM - 4:00 PM) (Friday, 9:00 AM - 12:00 Noon): [phone]  After hours: [phone]  [phone] _____________________________________________________________________________________________  Sacroiliac (SI) Joint Injection Patient Information  Description: The sacroiliac joint connects the scrum (very low back and tailbone) to the ilium (a pelvic bone which also forms half of the hip joint).  Normally this joint experiences very little motion.  When this joint becomes inflamed or unstable low back and or hip and pelvis pain may result.  Injection of this joint with local anesthetics (numbing medicines) and steroids can provide diagnostic information and reduce pain.  This injection is performed with the aid of x-ray guidance into the tailbone area while you are lying on your stomach.   You may experience an electrical sensation down the leg while this is being done.  You may also experience numbness.  We also may ask if we are reproducing your normal pain during the injection.  Conditions which may be treated SI injection:   Low back, buttock, hip or leg pain  Preparation for the Injection:  1. Do not eat any solid food or dairy products within 8 hours of your appointment.  2. You may drink clear liquids up to 3 hours before appointment.  Clear liquids include water, black coffee, juice or soda.  No milk or cream please. 3. You may take your regular medications, including pain medications with a sip of water before your appointment.  Diabetics should hold regular insulin (if take separately) and take 1/2 normal NPH dose the morning of the procedure.  Carry some sugar containing items with you to your appointment. 4. A driver must accompany you and be prepared to drive you home after your procedure. 5. Bring all of your current medications with you. 6. An IV may be inserted and sedation may be given at the discretion of the physician. 7. A blood pressure cuff, EKG and other monitors will often be applied during the procedure.  Some patients may need to have extra oxygen  administered for a short period.  8. You will be asked to provide medical information, including your allergies, prior to the procedure.  We must know immediately if you are taking blood thinners (like Coumadin/Warfarin) or if you are allergic to IV iodine contrast (dye).  We must know if you could possible be pregnant.  Possible side effects:   Bleeding from needle site  Infection (rare, may require surgery)  Nerve injury (rare)  Numbness & tingling (temporary)  A brief convulsion or seizure  Light-headedness (temporary)  Pain at injection site (several days)  Decreased blood pressure (temporary)  Weakness in the leg (temporary)   Call if you experience:   New onset weakness or numbness of an extremity below the injection site that last more than 8 hours.  Hives or difficulty breathing ( go to the emergency room)  Inflammation or drainage at the injection site  Any new symptoms which are concerning to you  Please note:  Although the local anesthetic injected can often make your back/ hip/ buttock/ leg feel good for several hours after the injections, the pain will likely return.  It  takes 3-7 days for steroids to work in the sacroiliac area.  You may not notice any pain relief for at least that one week.  If effective, we will often do a series of three injections spaced 3-6 weeks apart to maximally decrease your pain.  After the initial series, we generally will wait some months before a repeat injection of the same type.  If you have any questions, please call [phone] [facility]

## 2016-05-16 NOTE — Progress Notes (Signed)
Patient's Name: Lori Mckenzie  MRN: [id number]  Referring Provider: Ardine Eng, MD  DOB: 1965/10/15  PCP: Ardine Eng, MD  DOS: 05/16/2016  Note by: Kathlen Brunswick. Dossie Arbour, MD  Service setting: Ambulatory outpatient  Location: [facility] (AMB) Pain Management Facility  Visit type: Procedure  Specialty: Interventional Pain Management  Patient type: Established   Primary Reason for Visit: Interventional Pain Management Treatment. CC: Back Pain (lower, bilateral)  Procedure:  Anesthesia, Analgesia, Anxiolysis:  Procedure #1: Type: Diagnostic Medial Branch Facet Block (NO STEROIDS) Region: Lumbar Level: L2, L3, L4, L5, & S1 Medial Branch Level(s) Laterality: Bilateral  Procedure #2: Type: Diagnostic Sacroiliac Joint Block (NO STEROIDS) Region: Posterior Lumbosacral Level: PSIS (Posterior Superior Iliac Spine) Sacroiliac Joint Laterality: Bilateral  Type: Local Anesthesia with Moderate (Conscious) Sedation Local Anesthetic: Lidocaine 1% Route: Intravenous (IV) IV Access: Secured Sedation: Meaningful verbal contact was maintained at all times during the procedure  Indication(s): Analgesia and Anxiety  Indications: 1. Chronic low back pain (Location of Primary Source of Pain) (Bilateral) (R>L)   2. Lumbar facet syndrome (Location of Primary Source of Pain) (Bilateral) (R>L)   3. Chronic sacroiliac joint pain (Bilateral) (L>R)    Pain Score: Pre-procedure: 5 /10 Post-procedure: 0-No pain/10  Pre-op Assessment:  Previous date of service: 04/21/16 Service provided: Evaluation [name] is a 51 y.o. (year old), female patient, seen today for interventional treatment. She  has a past surgical history that includes Abdominal hysterectomy; Tubal ligation; Colonoscopy with propofol (N/A, 06/23/2014); Esophagogastroduodenoscopy (N/A, 06/23/2014); Cholecystectomy (N/A, 07/15/2014); ERCP (N/A, 07/16/2014); Wisdom tooth extraction; ERCP (N/A, 10/03/2014); and Esophagogastroduodenoscopy (egd) with propofol  (N/A, 12/01/2015). Her primarily concern today is the Back Pain (lower, bilateral)  Initial Vital Signs: Blood pressure 123/68, pulse (!) 108, temperature 98.3 F (36.8 C), temperature source Oral, resp. rate 16, height 5' 2"  (1.575 m), weight 260 lb (117.9 kg), SpO2 97 %. BMI: 47.55 kg/m  Risk Assessment: Allergies: Reviewed. She is allergic to tape and vicodin [hydrocodone-acetaminophen].  Allergy Precautions: None required Coagulopathies: Reviewed. None identified.  Blood-thinner therapy: None at this time Active Infection(s): Reviewed. None identified. [name] is afebrile  Site Confirmation: [name] was asked to confirm the procedure and laterality before marking the site Procedure checklist: Completed Consent: Before the procedure and under the influence of no sedative(s), amnesic(s), or anxiolytics, the patient was informed of the treatment options, risks and possible complications. To fulfill our ethical and legal obligations, as recommended by the American Medical Association's Code of Ethics, I have informed the patient of my clinical impression; the nature and purpose of the treatment or procedure; the risks, benefits, and possible complications of the intervention; the alternatives, including doing nothing; the risk(s) and benefit(s) of the alternative treatment(s) or procedure(s); and the risk(s) and benefit(s) of doing nothing. The patient was provided information about the general risks and possible complications associated with the procedure. These may include, but are not limited to: failure to achieve desired goals, infection, bleeding, organ or nerve damage, allergic reactions, paralysis, and death. In addition, the patient was informed of those risks and complications associated to Spine-related procedures, such as failure to decrease pain; infection (i.e.: Meningitis, epidural or intraspinal abscess); bleeding (i.e.: epidural hematoma, subarachnoid hemorrhage, or any other  type of intraspinal or peri-dural bleeding); organ or nerve damage (i.e.: Any type of peripheral nerve, nerve root, or spinal cord injury) with subsequent damage to sensory, motor, and/or autonomic systems, resulting in permanent pain, numbness, and/or weakness of one or several areas of the  body; allergic reactions; (i.e.: anaphylactic reaction); and/or death. Furthermore, the patient was informed of those risks and complications associated with the medications. These include, but are not limited to: allergic reactions (i.e.: anaphylactic or anaphylactoid reaction(s)); adrenal axis suppression; blood sugar elevation that in diabetics may result in ketoacidosis or comma; water retention that in patients with history of congestive heart failure may result in shortness of breath, pulmonary edema, and decompensation with resultant heart failure; weight gain; swelling or edema; medication-induced neural toxicity; particulate matter embolism and blood vessel occlusion with resultant organ, and/or nervous system infarction; and/or aseptic necrosis of one or more joints. Finally, the patient was informed that Medicine is not an exact science; therefore, there is also the possibility of unforeseen or unpredictable risks and/or possible complications that may result in a catastrophic outcome. The patient indicated having understood very clearly. We have given the patient no guarantees and we have made no promises. Enough time was given to the patient to ask questions, all of which were answered to the patient's satisfaction. [name] has indicated that she wanted to continue with the procedure. Attestation: I, the ordering provider, attest that I have discussed with the patient the benefits, risks, side-effects, alternatives, likelihood of achieving goals, and potential problems during recovery for the procedure that I have provided informed consent. Date: 05/16/2016; Time: 10:06 AM  Pre-Procedure Preparation:    Monitoring: As per clinic protocol. Respiration, ETCO2, SpO2, BP, heart rate and rhythm monitor placed and checked for adequate function Safety Precautions: Patient was assessed for positional comfort and pressure points before starting the procedure. Time-out: I initiated and conducted the "Time-out" before starting the procedure, as per protocol. The patient was asked to participate by confirming the accuracy of the "Time Out" information. Verification of the correct person, site, and procedure were performed and confirmed by me, the nursing staff, and the patient. "Time-out" conducted as per Joint Commission's Universal Protocol (UP.01.01.01). "Time-out" Date & Time: 05/16/2016; 1020 hrs.  Description of Procedure #1 Process:   Time-out: "Time-out" completed before starting procedure, as per protocol. Position: Prone Target Area: For Lumbar Facet blocks, the target is the groove formed by the junction of the transverse process and superior articular process. For the L5 dorsal ramus, the target is the notch between superior articular process and sacral ala. For the S1 dorsal ramus, the target is the superior and lateral edge of the posterior S1 Sacral foramen. Approach: Paramedial approach. Area Prepped: Entire Posterior Lumbosacral Region Prepping solution: ChloraPrep (2% chlorhexidine gluconate and 70% isopropyl alcohol) Safety Precautions: Aspiration looking for blood return was conducted prior to all injections. At no point did we inject any substances, as a needle was being advanced. No attempts were made at seeking any paresthesias. Safe injection practices and needle disposal techniques used. Medications properly checked for expiration dates. SDV (single dose vial) medications used.  Description of the Procedure: Protocol guidelines were followed. The patient was placed in position over the fluoroscopy table. The target area was identified and the area prepped in the usual manner. Skin  desensitized using vapocoolant spray. Skin & deeper tissues infiltrated with local anesthetic. Appropriate amount of time allowed to pass for local anesthetics to take effect. The procedure needle was introduced through the skin, ipsilateral to the reported pain, and advanced to the target area. Employing the "Medial Branch Technique", the needles were advanced to the angle made by the superior and medial portion of the transverse process, and the lateral and inferior portion of the superior articulating  process of the targeted vertebral bodies. This area is known as "Burton's Eye" or the "Eye of the [place] Dog". A procedure needle was introduced through the skin, and this time advanced to the angle made by the superior and medial border of the sacral ala, and the lateral border of the S1 vertebral body. This last needle was later repositioned at the superior and lateral border of the posterior S1 foramen. Negative aspiration confirmed. Solution injected in intermittent fashion, asking for systemic symptoms every 0.5cc of injectate. The needles were then removed and the area cleansed, making sure to leave some of the prepping solution back to take advantage of its long term bactericidal properties. Start Time: 1020 hrs. Materials:  Needle(s) Type: Regular needle Gauge: 22G Length: 3.5-in Medication(s): We administered fentaNYL, lidocaine (PF), and ropivacaine (PF) 2 mg/mL (0.2%). Total of 1 mL injected per level. (NO STEROIDS)  Description of Procedure # 2 Process:   Position: Prone Target Area: For upper sacroiliac joint block(s), the target is the superior and posterior margin of the sacroiliac joint. Approach: Ipsilateral approach. Area Prepped: Entire Posterior Lumbosacral Region Prepping solution: ChloraPrep (2% chlorhexidine gluconate and 70% isopropyl alcohol) Safety Precautions: Aspiration looking for blood return was conducted prior to all injections. At no point did we inject any  substances, as a needle was being advanced. No attempts were made at seeking any paresthesias. Safe injection practices and needle disposal techniques used. Medications properly checked for expiration dates. SDV (single dose vial) medications used. Description of the Procedure: Protocol guidelines were followed. The patient was placed in position over the fluoroscopy table. The target area was identified and the area prepped in the usual manner. Skin desensitized using vapocoolant spray. Skin & deeper tissues infiltrated with local anesthetic. Appropriate amount of time allowed to pass for local anesthetics to take effect. The procedure needle was advanced under fluoroscopic guidance into the sacroiliac joint until a firm endpoint was obtained. Proper needle placement secured. Negative aspiration confirmed. Solution injected in intermittent fashion, asking for systemic symptoms every 0.5cc of injectate. The needles were then removed and the area cleansed, making sure to leave some of the prepping solution back to take advantage of its long term bactericidal properties. Vitals:   05/16/16 1034 05/16/16 1044 05/16/16 1054 05/16/16 1104  BP: 105/78 133/69 130/82 112/70  Pulse:      Resp: 19 (!) 21 (!) 23 (!) 21  Temp:  97.2 F (36.2 C)    TempSrc:      SpO2: 95% 94% 93% 94%  Weight:      Height:        End Time: 1033 hrs. Materials:  Needle(s) Type: Regular needle Gauge: 22G Length: 3.5-in Medication(s): We administered fentaNYL, lidocaine (PF), and ropivacaine (PF) 2 mg/mL (0.2%). Please see chart orders for dosing details.  Imaging Guidance (Spinal):  Type of Imaging Technique: Fluoroscopy Guidance (Spinal) Indication(s): Assistance in needle guidance and placement for procedures requiring needle placement in or near specific anatomical locations not easily accessible without such assistance. Exposure Time: Please see nurses notes. Contrast: None used. Fluoroscopic Guidance: I was personally  present during the use of fluoroscopy. "Tunnel Vision Technique" used to obtain the best possible view of the target area. Parallax error corrected before commencing the procedure. "Direction-depth-direction" technique used to introduce the needle under continuous pulsed fluoroscopy. Once target was reached, antero-posterior, oblique, and lateral fluoroscopic projection used confirm needle placement in all planes. Images permanently stored in EMR. Interpretation: No contrast injected. I personally interpreted the imaging  intraoperatively. Adequate needle placement confirmed in multiple planes. Permanent images saved into the patient's record.  Antibiotic Prophylaxis:  Indication(s): None identified Antibiotic given: None  Post-operative Assessment:  EBL: None Complications: No immediate post-treatment complications observed by team, or reported by patient. Note: The patient tolerated the entire procedure well. A repeat set of vitals were taken after the procedure and the patient was kept under observation following institutional policy, for this type of procedure. Post-procedural neurological assessment was performed, showing return to baseline, prior to discharge. The patient was provided with post-procedure discharge instructions, including a section on how to identify potential problems. Should any problems arise concerning this procedure, the patient was given instructions to immediately contact us, at any time, without hesitation. In any case, we plan to contact the patient by telephone for a follow-up status report regarding this interventional procedure. Comments:  No additional relevant information.  Plan of Care  Disposition: Discharge home  Discharge Date & Time: 05/16/2016; 1104 hrs.  Physician-requested Follow-up:  Return in about 2 weeks (around 05/30/2016) for post-procedure eval (in 2 wks), by MD.  Future Appointments Date Time Provider [place]  06/02/2016 10:00 AM  Milinda Pointer, MD [facility] None   Medications ordered for procedure: Meds ordered this encounter  Medications  . lactated ringers infusion 1,000 mL  . midazolam (VERSED) 5 MG/5ML injection 1-2 mg    Make sure Flumazenil is available in the pyxis when using this medication. If oversedation occurs, administer 0.2 mg IV over 15 sec. If after 45 sec no response, administer 0.2 mg again over 1 min; may repeat at 1 min intervals; not to exceed 4 doses (1 mg)  . fentaNYL (SUBLIMAZE) injection 25-50 mcg    Make sure Narcan is available in the pyxis when using this medication. In the event of respiratory depression (RR< 8/min): Titrate NARCAN (naloxone) in increments of 0.1 to 0.2 mg IV at 2-3 minute intervals, until desired degree of reversal.  . lidocaine (PF) (XYLOCAINE) 1 % injection 10 mL  . ropivacaine (PF) 2 mg/mL (0.2%) (NAROPIN) injection 25 mL   Medications administered: We administered fentaNYL, lidocaine (PF), and ropivacaine (PF) 2 mg/mL (0.2%).  See the medical record for exact dosing, route, and time of administration.  Lab-work, Procedure(s), & Referral(s) Ordered: Orders Placed This Encounter  Procedures  . LUMBAR FACET(MEDIAL BRANCH NERVE BLOCK) MBNB  . SACROILIAC JOINT INJECTINS  . DG C-Arm 1-60 Min-No Report  . Glucose, capillary  . Informed Consent Details: Transcribe to consent form and obtain patient signature  . Provider attestation of informed consent for procedure/surgical case  . Verify informed consent  . Discharge instructions  . Follow-up   Imaging Ordered: Results for orders placed in visit on 03/23/16  DG C-Arm 1-60 Min-No Report   Narrative Fluoroscopy was utilized by the requesting physician.  No radiographic  interpretation.    New Prescriptions   No medications on file   Primary Care Physician: Ardine Eng, MD Location: [facility] Note by: Kathlen Brunswick. Dossie Arbour, M.D, DABA, DABAPM, DABPM, DABIPP, FIPP Date:  05/16/2016; Time: 11:15 AM  Disclaimer:  Medicine is not an Chief Strategy Officer. The only guarantee in medicine is that nothing is guaranteed. It is important to note that the decision to proceed with this intervention was based on the information collected from the patient. The Data and conclusions were drawn from the patient's questionnaire, the interview, and the physical examination. Because the information was provided in large part by the patient, it cannot  be guaranteed that it has not been purposely or unconsciously manipulated. Every effort has been made to obtain as much relevant data as possible for this evaluation. It is important to note that the conclusions that lead to this procedure are derived in large part from the available data. Always take into account that the treatment will also be dependent on availability of resources and existing treatment guidelines, considered by other Pain Management Practitioners as being common knowledge and practice, at the time of the intervention. For Medico-Legal purposes, it is also important to point out that variation in procedural techniques and pharmacological choices are the acceptable norm. The indications, contraindications, technique, and results of the above procedure should only be interpreted and judged by a Board-Certified Interventional Pain Specialist with extensive familiarity and expertise in the same exact procedure and technique.  Instructions provided at this appointment: Patient Instructions   Pain Management Discharge Instructions  General Discharge Instructions :  If you need to reach your doctor call: Monday-Friday 8:00 am - 4:00 pm at [phone] or toll free [phone].  After clinic hours [phone] to have operator reach doctor.  Bring all of your medication bottles to all your appointments in the pain clinic.  To cancel or reschedule your appointment with Pain Management please remember to call 24 hours in advance to  avoid a fee.  Refer to the educational materials which you have been given on: General Risks, I had my Procedure. Discharge Instructions, Post Sedation.  Post Procedure Instructions:  The drugs you were given will stay in your system until tomorrow, so for the next 24 hours you should not drive, make any legal decisions or drink any alcoholic beverages.  You may eat anything you prefer, but it is better to start with liquids then soups and crackers, and gradually work up to solid foods.  Please notify your doctor immediately if you have any unusual bleeding, trouble breathing or pain that is not related to your normal pain.  Depending on the type of procedure that was done, some parts of your body may feel week and/or numb.  This usually clears up by tonight or the next day.  Walk with the use of an assistive device or accompanied by an adult for the 24 hours.  You may use ice on the affected area for the first 24 hours.  Put ice in a Ziploc bag and cover with a towel and place against area 15 minutes on 15 minutes off.  You may switch to heat after 24 hours.GENERAL RISKS AND COMPLICATIONS  What are the risk, side effects and possible complications? Generally speaking, most procedures are safe.  However, with any procedure there are risks, side effects, and the possibility of complications.  The risks and complications are dependent upon the sites that are lesioned, or the type of nerve block to be performed.  The closer the procedure is to the spine, the more serious the risks are.  Great care is taken when placing the radio frequency needles, block needles or lesioning probes, but sometimes complications can occur. 1. Infection: Any time there is an injection through the skin, there is a risk of infection.  This is why sterile conditions are used for these blocks.  There are four possible types of infection. 1. Localized skin infection. 2. Central Nervous System Infection-This can be in the  form of Meningitis, which can be deadly. 3. Epidural Infections-This can be in the form of an epidural abscess, which can cause pressure inside of the  spine, causing compression of the spinal cord with subsequent paralysis. This would require an emergency surgery to decompress, and there are no guarantees that the patient would recover from the paralysis. 4. Discitis-This is an infection of the intervertebral discs.  It occurs in about 1% of discography procedures.  It is difficult to treat and it may lead to surgery.        2. Pain: the needles have to go through skin and soft tissues, will cause soreness.       3. Damage to internal structures:  The nerves to be lesioned may be near blood vessels or    other nerves which can be potentially damaged.       4. Bleeding: Bleeding is more common if the patient is taking blood thinners such as  aspirin, Coumadin, Ticiid, Plavix, etc., or if he/she have some genetic predisposition  such as hemophilia. Bleeding into the spinal canal can cause compression of the spinal  cord with subsequent paralysis.  This would require an emergency surgery to  decompress and there are no guarantees that the patient would recover from the  paralysis.       5. Pneumothorax:  Puncturing of a lung is a possibility, every time a needle is introduced in  the area of the chest or upper back.  Pneumothorax refers to free air around the  collapsed lung(s), inside of the thoracic cavity (chest cavity).  Another two possible  complications related to a similar event would include: Hemothorax and Chylothorax.   These are variations of the Pneumothorax, where instead of air around the collapsed  lung(s), you may have blood or chyle, respectively.       6. Spinal headaches: They may occur with any procedures in the area of the spine.       7. Persistent CSF (Cerebro-Spinal Fluid) leakage: This is a rare problem, but may occur  with prolonged intrathecal or epidural catheters either due to  the formation of a fistulous  track or a dural tear.       8. Nerve damage: By working so close to the spinal cord, there is always a possibility of  nerve damage, which could be as serious as a permanent spinal cord injury with  paralysis.       9. Death:  Although rare, severe deadly allergic reactions known as "Anaphylactic  reaction" can occur to any of the medications used.      10. Worsening of the symptoms:  We can always make thing worse.  What are the chances of something like this happening? Chances of any of this occuring are extremely low.  By statistics, you have more of a chance of getting killed in a motor vehicle accident: while driving to the hospital than any of the above occurring .  Nevertheless, you should be aware that they are possibilities.  In general, it is similar to taking a shower.  Everybody knows that you can slip, hit your head and get killed.  Does that mean that you should not shower again?  Nevertheless always keep in mind that statistics do not mean anything if you happen to be on the wrong side of them.  Even if a procedure has a 1 (one) in a 1,000,000 (million) chance of going wrong, it you happen to be that one..Also, keep in mind that by statistics, you have more of a chance of having something go wrong when taking medications.  Who should not have this procedure? If you are  on a blood thinning medication (e.g. Coumadin, Plavix, see list of "Blood Thinners"), or if you have an active infection going on, you should not have the procedure.  If you are taking any blood thinners, please inform your physician.  How should I prepare for this procedure?  Do not eat or drink anything at least six hours prior to the procedure.  Bring a driver with you .  It cannot be a taxi.  Come accompanied by an adult that can drive you back, and that is strong enough to help you if your legs get weak or numb from the local anesthetic.  Take all of your medicines the morning of  the procedure with just enough water to swallow them.  If you have diabetes, make sure that you are scheduled to have your procedure done first thing in the morning, whenever possible.  If you have diabetes, take only half of your insulin dose and notify our nurse that you have done so as soon as you arrive at the clinic.  If you are diabetic, but only take blood sugar pills (oral hypoglycemic), then do not take them on the morning of your procedure.  You may take them after you have had the procedure.  Do not take aspirin or any aspirin-containing medications, at least eleven (11) days prior to the procedure.  They may prolong bleeding.  Wear loose fitting clothing that may be easy to take off and that you would not mind if it got stained with Betadine or blood.  Do not wear any jewelry or perfume  Remove any nail coloring.  It will interfere with some of our monitoring equipment.  NOTE: Remember that this is not meant to be interpreted as a complete list of all possible complications.  Unforeseen problems may occur.  BLOOD THINNERS The following drugs contain aspirin or other products, which can cause increased bleeding during surgery and should not be taken for 2 weeks prior to and 1 week after surgery.  If you should need take something for relief of minor pain, you may take acetaminophen which is found in Tylenol,m Datril, Anacin-3 and Panadol. It is not blood thinner. The products listed below are.  Do not take any of the products listed below in addition to any listed on your instruction sheet.  A.P.C or A.P.C with Codeine Codeine Phosphate Capsules #3 Ibuprofen Ridaura  ABC compound Congesprin Imuran rimadil  Advil Cope Indocin Robaxisal  Alka-Seltzer Effervescent Pain Reliever and Antacid Coricidin or Coricidin-D  Indomethacin Rufen  Alka-Seltzer plus Cold Medicine Cosprin Ketoprofen S-A-C Tablets  Anacin Analgesic Tablets or Capsules Coumadin Korlgesic Salflex  Anacin Extra  Strength Analgesic tablets or capsules CP-2 Tablets Lanoril Salicylate  Anaprox Cuprimine Capsules Levenox Salocol  Anexsia-D Dalteparin Magan Salsalate  Anodynos Darvon compound Magnesium Salicylate Sine-off  Ansaid Dasin Capsules Magsal Sodium Salicylate  Anturane Depen Capsules Marnal Soma  APF Arthritis pain formula Dewitt's Pills Measurin Stanback  Argesic Dia-Gesic Meclofenamic Sulfinpyrazone  Arthritis Bayer Timed Release Aspirin Diclofenac Meclomen Sulindac  Arthritis pain formula Anacin Dicumarol Medipren Supac  Analgesic (Safety coated) Arthralgen Diffunasal Mefanamic Suprofen  Arthritis Strength Bufferin Dihydrocodeine Mepro Compound Suprol  Arthropan liquid Dopirydamole Methcarbomol with Aspirin Synalgos  ASA tablets/Enseals Disalcid Micrainin Tagament  Ascriptin Doan's Midol Talwin  Ascriptin A/D Dolene Mobidin Tanderil  Ascriptin Extra Strength Dolobid Moblgesic Ticlid  Ascriptin with Codeine Doloprin or Doloprin with Codeine Momentum Tolectin  Asperbuf Duoprin Mono-gesic Trendar  Aspergum Duradyne Motrin or Motrin IB Triminicin  Aspirin plain, buffered  or enteric coated Durasal Myochrisine Trigesic  Aspirin Suppositories Easprin Nalfon Trillsate  Aspirin with Codeine Ecotrin Regular or Extra Strength Naprosyn Uracel  Atromid-S Efficin Naproxen Ursinus  Auranofin Capsules Elmiron Neocylate Vanquish  Axotal Emagrin Norgesic Verin  Azathioprine Empirin or Empirin with Codeine Normiflo Vitamin E  Azolid Emprazil Nuprin Voltaren  Bayer Aspirin plain, buffered or children's or timed BC Tablets or powders Encaprin Orgaran Warfarin Sodium  Buff-a-Comp Enoxaparin Orudis Zorpin  Buff-a-Comp with Codeine Equegesic Os-Cal-Gesic   Buffaprin Excedrin plain, buffered or Extra Strength Oxalid   Bufferin Arthritis Strength Feldene Oxphenbutazone   Bufferin plain or Extra Strength Feldene Capsules Oxycodone with Aspirin   Bufferin with Codeine Fenoprofen Fenoprofen Pabalate or  Pabalate-SF   Buffets II Flogesic Panagesic   Buffinol plain or Extra Strength Florinal or Florinal with Codeine Panwarfarin   Buf-Tabs Flurbiprofen Penicillamine   Butalbital Compound Four-way cold tablets Penicillin   Butazolidin Fragmin Pepto-Bismol   Carbenicillin Geminisyn Percodan   Carna Arthritis Reliever Geopen Persantine   Carprofen Gold's salt Persistin   Chloramphenicol Goody's Phenylbutazone   Chloromycetin Haltrain Piroxlcam   Clmetidine heparin Plaquenil   Cllnoril Hyco-pap Ponstel   Clofibrate Hydroxy chloroquine Propoxyphen         Before stopping any of these medications, be sure to consult the physician who ordered them.  Some, such as Coumadin (Warfarin) are ordered to prevent or treat serious conditions such as "deep thrombosis", "pumonary embolisms", and other heart problems.  The amount of time that you may need off of the medication may also vary with the medication and the reason for which you were taking it.  If you are taking any of these medications, please make sure you notify your pain physician before you undergo any procedures.         Facet Joint Block, Care After Refer to this sheet in the next few weeks. These instructions provide you with information about caring for yourself after your procedure. Your health care provider may also give you more specific instructions. Your treatment has been planned according to current medical practices, but problems sometimes occur. Call your health care provider if you have any problems or questions after your procedure. What can I expect after the procedure? After the procedure, it is common to have:  Some tenderness over the injection sites for 2 days after the procedure.  A temporary increase in blood sugar if you have diabetes. Follow these instructions at home:  Keep track of the amount of pain relief you feel and how long it lasts.  Take over-the-counter and prescription medicines only as told by your  health care provider. You may need to limit pain medicine within the first 4-6 hours after the procedure.  Remove your bandages (dressings) the morning after the procedure.  For the first 24 hours after the procedure:  Do not apply heat near or over the injection sites.  Do not take a bath or soak in water, such as in a pool or lake.  Do not drive or operate heavy machinery unless approved by your health care provider.  Avoid activities that require a lot of energy.  If the injection site is tender, try applying ice to the area. To do this:  Put ice in a plastic bag.  Place a towel between your skin and the bag.  Leave the ice on for 20 minutes, 2-3 times a day.  Keep all follow-up visits as told by your health care provider. This is important. Contact a health care  provider if:  Fluid is coming from an injection site.  There is significant bleeding or swelling at an injection site.  You have diabetes and your blood sugar is above 180 mg/dL. Get help right away if:  You have a fever.  You have worsening pain or swelling around an injection site.  There are red streaks around an injection site.  You develop severe pain that is not controlled by your medicines.  You develop a headache, stiff neck, nausea, or vomiting.  Your eyes become very sensitive to light.  You have weakness, paralysis, or tingling in your arms or legs that was not present before the procedure.  You have difficulty urinating or breathing. This information is not intended to replace advice given to you by your health care provider. Make sure you discuss any questions you have with your health care provider. Document Released: 12/07/2011 Document Revised: 05/06/2015 Document Reviewed: 09/15/2014 Elsevier Interactive Patient Education  2017 [employer] Facet Joint Block The facet joints connect the bones of the spine (vertebrae). They make it possible for you to bend, twist, and make other  movements with your spine. They also keep you from bending too far, twisting too far, and making other excessive movements. A facet joint block is a procedure where a numbing medicine (anesthetic) is injected into a facet joint. Often, a type of anti-inflammatory medicine called a steroid is also injected. A facet joint block may be done to diagnose neck or back pain. If the pain gets better after a facet joint block, it means the pain is probably coming from the facet joint. If the pain does not get better, it means the pain is probably not coming from the facet joint. A facet joint block may also be done to relieve neck or back pain caused by an inflamed facet joint. A facet joint block is only done to relieve pain if the pain does not improve with other methods, such as medicine, exercise programs, and physical therapy. Tell a health care provider about:  Any allergies you have.  All medicines you are taking, including vitamins, herbs, eye drops, creams, and over-the-counter medicines.  Any problems you or family members have had with anesthetic medicines.  Any blood disorders you have.  Any surgeries you have had.  Any medical conditions you have.  Whether you are pregnant or may be pregnant. What are the risks? Generally, this is a safe procedure. However, problems may occur, including:  Bleeding.  Injury to a nerve near the injection site.  Pain at the injection site.  Weakness or numbness in areas controlled by nerves near the injection site.  Infection.  Temporary fluid retention.  Allergic reactions to medicines or dyes.  Injury to other structures or organs near the injection site. What happens before the procedure?  Follow instructions from your health care provider about eating or drinking restrictions.  Ask your health care provider about:  Changing or stopping your regular medicines. This is especially important if you are taking diabetes medicines or blood  thinners.  Taking medicines such as aspirin and ibuprofen. These medicines can thin your blood. Do not take these medicines before your procedure if your health care provider instructs you not to.  Do not take any new dietary supplements or medicines without asking your health care provider first.  Plan to have someone take you home after the procedure. What happens during the procedure?  You may need to remove your clothing and dress in an open-back gown.  The procedure will be done while you are lying on an X-ray table. You will most likely be asked to lie on your stomach, but you may be asked to lie in a different position if an injection will be made in your neck.  Machines will be used to monitor your oxygen levels, heart rate, and blood pressure.  If an injection will be made in your neck, an IV tube will be inserted into one of your veins. Fluids and medicine will flow directly into your body through the IV tube.  The area over the facet joint where the injection will be made will be cleaned with soap. The surrounding skin will be covered with clean drapes.  A numbing medicine (local anesthetic) will be applied to your skin. Your skin may sting or burn for a moment.  A video X-ray machine (fluoroscopy) will be used to locate the joint. In some cases, a CT scan may be used.  A contrast dye may be injected into the facet joint area to help locate the joint.  When the joint is located, an anesthetic will be injected into the joint through the needle.  Your health care provider will ask you whether you feel pain relief. If you do feel relief, a steroid may be injected to provide pain relief for a longer period of time. If you do not feel relief or feel only partial relief, additional injections of an anesthetic may be made in other facet joints.  The needle will be removed.  Your skin will be cleaned.  A bandage (dressing) will be applied over each injection site. The procedure  may vary among health care providers and hospitals. What happens after the procedure?  You will be observed for 15-30 minutes before being allowed to go home. This information is not intended to replace advice given to you by your health care provider. Make sure you discuss any questions you have with your health care provider. Document Released: 05/11/2006 Document Revised: 01/21/2015 Document Reviewed: 09/15/2014 Elsevier Interactive Patient Education  2017 [place]. Post-Procedure instructions Instructions:  Apply ice: Fill a plastic sandwich bag with crushed ice. Cover it with a small towel and apply to injection site. Apply for 15 minutes then remove x 15 minutes. Repeat sequence on day of procedure, until you go to bed. The purpose is to minimize swelling and discomfort after procedure.  Apply heat: Apply heat to procedure site starting the day following the procedure. The purpose is to treat any soreness and discomfort from the procedure.  Food intake: Start with clear liquids (like water) and advance to regular food, as tolerated.   Physical activities: Keep activities to a minimum for the first 8 hours after the procedure.   Driving: If you have received any sedation, you are not allowed to drive for 24 hours after your procedure.  Blood thinner: Restart your blood thinner 6 hours after your procedure. (Only for those taking blood thinners)  Insulin: As soon as you can eat, you may resume your normal dosing schedule. (Only for those taking insulin)  Infection prevention: Keep procedure site clean and dry.  Post-procedure Pain Diary: Extremely important that this be done correctly and accurately. Recorded information will be used to determine the next step in treatment.  Pain evaluated is that of treated area only. Do not include pain from an untreated area.  Complete every hour, on the hour, for the initial 8 hours. Set an alarm to help you do this part accurately.  Do  not go to sleep and have it completed later. It will not be accurate.  Follow-up appointment: Keep your follow-up appointment after the procedure. Usually 2 weeks for most procedures. (6 weeks in the case of radiofrequency.) Bring you pain diary.  Expect:  From numbing medicine (AKA: Local Anesthetics): Numbness or decrease in pain.  Onset: Full effect within 15 minutes of injected.  Duration: It will depend on the type of local anesthetic used. On the average, 1 to 8 hours.   From steroids: Decrease in swelling or inflammation. Once inflammation is improved, relief of the pain will follow.  Onset of benefits: Depends on the amount of swelling present. The more swelling, the longer it will take for the benefits to be seen.   Duration: Steroids will stay in the system x 2 weeks. Duration of benefits will depend on multiple posibilities including persistent irritating factors.  From procedure: Some discomfort is to be expected once the numbing medicine wears off. This should be minimal if ice and heat are applied as instructed. Call if:  You experience numbness and weakness that gets worse with time, as opposed to wearing off.  New onset bowel or bladder incontinence. (Spinal procedures only)  Emergency Numbers:  [place] hours (Monday - Thursday, 8:00 AM - 4:00 PM) (Friday, 9:00 AM - 12:00 Noon): [phone]  After hours: [phone] _____________________________________________________________________________________________  Sacroiliac (SI) Joint Injection Patient Information  Description: The sacroiliac joint connects the scrum (very low back and tailbone) to the ilium (a pelvic bone which also forms half of the hip joint).  Normally this joint experiences very little motion.  When this joint becomes inflamed or unstable low back and or hip and pelvis pain may result.  Injection of this joint with local anesthetics (numbing medicines) and steroids can provide  diagnostic information and reduce pain.  This injection is performed with the aid of x-ray guidance into the tailbone area while you are lying on your stomach.   You may experience an electrical sensation down the leg while this is being done.  You may also experience numbness.  We also may ask if we are reproducing your normal pain during the injection.  Conditions which may be treated SI injection:   Low back, buttock, hip or leg pain  Preparation for the Injection:  1. Do not eat any solid food or dairy products within 8 hours of your appointment.  2. You may drink clear liquids up to 3 hours before appointment.  Clear liquids include water, black coffee, juice or soda.  No milk or cream please. 3. You may take your regular medications, including pain medications with a sip of water before your appointment.  Diabetics should hold regular insulin (if take separately) and take 1/2 normal NPH dose the morning of the procedure.  Carry some sugar containing items with you to your appointment. 4. A driver must accompany you and be prepared to drive you home after your procedure. 5. Bring all of your current medications with you. 6. An IV may be inserted and sedation may be given at the discretion of the physician. 7. A blood pressure cuff, EKG and other monitors will often be applied during the procedure.  Some patients may need to have extra oxygen administered for a short period.  8. You will be asked to provide medical information, including your allergies, prior to the procedure.  We must know immediately if you are taking blood thinners (like Coumadin/Warfarin) or if you are allergic to IV  iodine contrast (dye).  We must know if you could possible be pregnant.  Possible side effects:   Bleeding from needle site  Infection (rare, may require surgery)  Nerve injury (rare)  Numbness & tingling (temporary)  A brief convulsion or seizure  Light-headedness (temporary)  Pain at injection  site (several days)  Decreased blood pressure (temporary)  Weakness in the leg (temporary)   Call if you experience:   New onset weakness or numbness of an extremity below the injection site that last more than 8 hours.  Hives or difficulty breathing ( go to the emergency room)  Inflammation or drainage at the injection site  Any new symptoms which are concerning to you  Please note:  Although the local anesthetic injected can often make your back/ hip/ buttock/ leg feel good for several hours after the injections, the pain will likely return.  It takes 3-7 days for steroids to work in the sacroiliac area.  You may not notice any pain relief for at least that one week.  If effective, we will often do a series of three injections spaced 3-6 weeks apart to maximally decrease your pain.  After the initial series, we generally will wait some months before a repeat injection of the same type.  If you have any questions, please call [phone] [facility]

## 2016-05-16 NOTE — Progress Notes (Signed)
Safety precautions to be maintained throughout the outpatient stay will include: orient to surroundings, keep bed in low position, maintain call bell within reach at all times, provide assistance with transfer out of bed and ambulation.  

## 2016-05-17 ENCOUNTER — Telehealth: Payer: Self-pay | Admitting: *Deleted

## 2016-05-17 NOTE — Telephone Encounter (Signed)
Left voicemail for patient to call for any post procedure issues.

## 2016-05-17 NOTE — Telephone Encounter (Signed)
Attempted to call patient, message left.

## 2016-05-17 NOTE — Telephone Encounter (Signed)
Denies fever, loss of bowel or bladder control. Describes rash on back as small red spots on lower back, at injection sites. Having increased pain since procedure yesterday. Explained to patient that it is normal to have increased pain in the following days after the procedure. Offered to come for eval appointment tomorrow. Does not wish to come at this time. Will call tomorrow if she changes her mind.

## 2016-05-18 ENCOUNTER — Telehealth: Payer: Self-pay | Admitting: Pain Medicine

## 2016-05-18 NOTE — Telephone Encounter (Signed)
Pt states she is in [place] so she could rest and is having a lot of pain. She states when  Walking she feels like she is stepping on glass and pain radiates up legs bilaterally around to groin. Left side is worse. She also states she had a rash on her back and put hydrocortisone cream on it and better.  She states she is unable to take tylenol related to kidneys.  Also, she states that she felt the needles in her back two time and wondering if something happened. ( can hear husband in background talking and telling her what to say). Pt not running a temperature. Instructed patient to Go to the ER if she fells  like she needs to. Mrs Lori Mckenzie has been using heat and ice with no success. Will talk with Dr Lowella Dandy for advice and call pt back.

## 2016-05-18 NOTE — Telephone Encounter (Signed)
Patient's husband called and patient was crying in background. States patient is in a lot pain and they would like to talk with Nurse or Dr. Dossie Arbour

## 2016-05-18 NOTE — Telephone Encounter (Signed)
No answer left message on answering machine that I spoke with Dr Dossie Arbour and he would like for her to come in for an evaluation and if she needs to she can go to the ER.

## 2016-05-19 ENCOUNTER — Telehealth: Payer: Self-pay | Admitting: *Deleted

## 2016-05-19 ENCOUNTER — Ambulatory Visit: Payer: Medicaid Other | Attending: Pain Medicine | Admitting: Pain Medicine

## 2016-05-19 ENCOUNTER — Encounter: Payer: Self-pay | Admitting: Pain Medicine

## 2016-05-19 VITALS — BP 132/70 | HR 110 | Temp 98.5°F | Resp 16 | Ht 62.0 in | Wt 260.0 lb

## 2016-05-19 DIAGNOSIS — G5603 Carpal tunnel syndrome, bilateral upper limbs: Secondary | ICD-10-CM | POA: Insufficient documentation

## 2016-05-19 DIAGNOSIS — M5441 Lumbago with sciatica, right side: Secondary | ICD-10-CM | POA: Diagnosis not present

## 2016-05-19 DIAGNOSIS — M48061 Spinal stenosis, lumbar region without neurogenic claudication: Secondary | ICD-10-CM | POA: Diagnosis not present

## 2016-05-19 DIAGNOSIS — M5416 Radiculopathy, lumbar region: Secondary | ICD-10-CM

## 2016-05-19 DIAGNOSIS — M4804 Spinal stenosis, thoracic region: Secondary | ICD-10-CM | POA: Diagnosis not present

## 2016-05-19 DIAGNOSIS — M5442 Lumbago with sciatica, left side: Secondary | ICD-10-CM | POA: Diagnosis not present

## 2016-05-19 DIAGNOSIS — Z794 Long term (current) use of insulin: Secondary | ICD-10-CM | POA: Insufficient documentation

## 2016-05-19 DIAGNOSIS — M545 Low back pain: Secondary | ICD-10-CM | POA: Insufficient documentation

## 2016-05-19 DIAGNOSIS — G8929 Other chronic pain: Secondary | ICD-10-CM

## 2016-05-19 DIAGNOSIS — M79604 Pain in right leg: Secondary | ICD-10-CM

## 2016-05-19 DIAGNOSIS — G8918 Other acute postprocedural pain: Secondary | ICD-10-CM | POA: Diagnosis not present

## 2016-05-19 DIAGNOSIS — M79605 Pain in left leg: Secondary | ICD-10-CM

## 2016-05-19 NOTE — Telephone Encounter (Signed)
Patient coming in for an appoint 05/19/16

## 2016-05-19 NOTE — Patient Instructions (Signed)
_______________________________________________________________  Preparing for Procedure with Sedation Instructions: . Oral Intake: Do not eat or drink anything for at least 8 hours prior to your procedure. . Transportation: Public transportation is not allowed. Bring an adult driver. The driver must be physically present in our waiting room before any procedure can be started. [facility] Physical Assistance: Bring an adult physically capable of assisting you, in the event you need help. This adult should keep you company at home for at least 6 hours after the procedure. . Blood Pressure Medicine: Take your blood pressure medicine with a sip of water the morning of the procedure. . Blood thinners:  . Diabetics on insulin: Notify the staff so that you can be scheduled 1st case in the morning. If your diabetes requires high dose insulin, take only  of your normal insulin dose the morning of the procedure and notify the staff that you have done so. . Preventing infections: Shower with an antibacterial soap the morning of your procedure. . Build-up your immune system: Take 1000 mg of Vitamin C with every meal (3 times a day) the day prior to your procedure. [facility] Kitchen Antibiotics: Inform the staff if you have a condition or reason that requires you to take antibiotics before dental procedures. . Pregnancy: If you are pregnant, call and cancel the procedure. . Sickness: If you have a cold, fever, or any active infections, call and cancel the procedure. . Arrival: You must be in the facility at least 30 minutes prior to your scheduled procedure. . Children: Do not bring children with you. . Dress appropriately: Bring dark clothing that you would not mind if they get stained. . Valuables: Do not bring any jewelry or valuables. Procedure appointments are reserved for interventional treatments only. Mar[facility] Kitchen No Prescription Refills. . No medication changes will be discussed during procedure appointments. . No disability issues  will be discussed. ______________________________________________________________________________________________  Post-Procedure instructions Instructions:  Apply ice: Fill a plastic sandwich bag with crushed ice. Cover it with a small towel and apply to injection site. Apply for 15 minutes then remove x 15 minutes. Repeat sequence on day of procedure, until you go to bed. The purpose is to minimize swelling and discomfort after procedure.  Apply heat: Apply heat to procedure site starting the day following the procedure. The purpose is to treat any soreness and discomfort from the procedure.  Food intake: Start with clear liquids (like water) and advance to regular food, as tolerated.   Physical activities: Keep activities to a minimum for the first 8 hours after the procedure.   Driving: If you have received any sedation, you are not allowed to drive for 24 hours after your procedure.  Blood thinner: Restart your blood thinner 6 hours after your procedure. (Only for those taking blood thinners)  Insulin: As soon as you can eat, you may resume your normal dosing schedule. (Only for those taking insulin)  Infection prevention: Keep procedure site clean and dry.  Post-procedure Pain Diary: Extremely important that this be done correctly and accurately. Recorded information will be used to determine the next step in treatment.  Pain evaluated is that of treated area only. Do not include pain from an untreated area.  Complete every hour, on the hour, for the initial 8 hours. Set an alarm to help you do this part accurately.  Do not go to sleep and have it completed later. It will not be accurate.  Follow-up appointment: Keep your follow-up appointment after the procedure. Usually 2 weeks for most  procedures. (6 weeks in the case of radiofrequency.) Bring you pain diary.  Expect:  From numbing medicine (AKA: Local Anesthetics): Numbness or decrease in pain.  Onset: Full effect within  15 minutes of injected.  Duration: It will depend on the type of local anesthetic used. On the average, 1 to 8 hours.   From steroids: Decrease in swelling or inflammation. Once inflammation is improved, relief of the pain will follow.  Onset of benefits: Depends on the amount of swelling present. The more swelling, the longer it will take for the benefits to be seen.   Duration: Steroids will stay in the system x 2 weeks. Duration of benefits will depend on multiple posibilities including persistent irritating factors.  From procedure: Some discomfort is to be expected once the numbing medicine wears off. This should be minimal if ice and heat are applied as instructed. Call if:  You experience numbness and weakness that gets worse with time, as opposed to wearing off.  New onset bowel or bladder incontinence. (Spinal procedures only)  Emergency Numbers:  [facility] business hours (Monday - Thursday, 8:00 AM - 4:00 PM) (Friday, 9:00 AM - 12:00 Noon): [phone]  After hours: [phone] _____________________________________________________________________________________________

## 2016-05-19 NOTE — Progress Notes (Signed)
Safety precautions to be maintained throughout the outpatient stay will include: orient to surroundings, keep bed in low position, maintain call bell within reach at all times, provide assistance with transfer out of bed and ambulation.  

## 2016-05-19 NOTE — Progress Notes (Signed)
Patient's Name: Lori Mckenzie  MRN: [id number]  Referring Provider: Ardine Eng, MD  DOB: 03-27-1965  PCP: Ardine Eng, MD  DOS: 05/19/2016  Note by: Kathlen Brunswick. Dossie Arbour, MD  Service setting: Ambulatory outpatient  Specialty: Interventional Pain Management  Location: [facility] (AMB) Pain Management Facility    Patient type: Established   Primary Reason(s) for Visit: Encounter for post-procedure evaluation of chronic illness with mild to moderate exacerbation CC: Back Pain (mid to lower back ) and Leg Pain (pain from knee up to the thigh both legs left worse than right)  HPI  Lori Mckenzie is a 51 y.o. year old, female patient, who comes today for a post-procedure evaluation. She has Type 2 diabetes mellitus ([place]); COPD (chronic obstructive pulmonary disease) (HCC); GERD (gastroesophageal reflux disease); OSA on CPAP; Anxiety; Steatohepatitis; Dysthymia; Other social stressor; Ascites; NASH (nonalcoholic steatohepatitis); Hypokalemia; Opiate use (75 MME/Day); Long term prescription opiate use; Long term current use of opiate analgesic; Encounter for therapeutic drug level monitoring; Chronic epigastric abdominal pain; Chronic low back pain (Location of Primary Source of Pain) (Bilateral) (R>L); Chronic neck pain (Location of Tertiary source of pain) (Bilateral) (R>L); Breath shortness; Diastolic dysfunction; Clinical depression; Airway hyperreactivity; Essential (primary) hypertension; 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, L1-2, & L4-5); Lumbar and sacral osteoarthritis; Myofascial pain; Drowsiness; Episode of syncope; Subacute lumbar radiculopathy (left side) (S1 dermatome); Vitamin D deficiency; B12 deficiency; Folate deficiency; Thoracic radiculitis; Elevated sedimentation rate; Elevated C-reactive protein (CRP); Lumbar facet syndrome (Location of Primary Source of Pain) (Bilateral) (R>L); Cervical spondylosis; Chronic feet pain (Location of Secondary source of pain) (Bilateral) (R>L); Lumbar spondylosis;  Encounter for chronic pain management; Chronic shoulder pain (Bilateral) (R>L); Chronic carpal tunnel syndrome (Bilateral); Chronic hip pain (Bilateral) (L>R); Chronic upper back pain (Bilateral) (L>R); Osteoporosis, idiopathic; Abnormal MRI, lumbar spine (02/03/2015); Hepatic encephalopathy ([place]); Radicular pain of thoracic region; Altered mental status; Cirrhosis of liver without ascites ([place]); Elevated liver enzymes; Opiate withdrawal ([place]); Chronic pain syndrome; Asthma; Lumbar spinal stenosis; Lumbosacral spondylosis without myelopathy; Neuromyositis; Somnolence; Spinal stenosis, lumbar region without neurogenic claudication (L1-2 and L4-5); Spondylosis of lumbar region without myelopathy or radiculopathy; Acute cervical myofascial strain; Allodynia; Osteoarthritis; Chronic pain of left upper extremity; Chronic radicular cervical pain (L); Chronic sacroiliac joint pain (Bilateral) (L>R); Diabetes mellitus, insulin dependent (IDDM), uncontrolled ([name]); Acute postoperative pain; Chronic pain of lower extremity, bilateral; and Spinal stenosis, thoracic region (T10-11) on her problem list. Her primarily concern today is the Back Pain (mid to lower back ) and Leg Pain (pain from knee up to the thigh both legs left worse than right)  Pain Assessment: Self-Reported Pain Score: 5 /10             Reported level is compatible with observation.       Pain Type: Chronic pain Pain Location: Back Pain Orientation: Mid, Lower Pain Descriptors / Indicators: Constant, Radiating, Burning, Stabbing (intense jabbing and hurts more when walking) Pain Frequency: Constant  Lori Mckenzie comes in today for post-procedure evaluation after the treatment done on 05/18/2016. The patient returns to the clinics today on an unscheduled appointment due to her low back pain. Although the patient did get 100% relief of the pain for the first hour, she did not get any significant relief for the following 4-6 hours. The initial hour was  probably secondary to the effects of the IV sedation while the following 4-6 hours would've been secondary to the local anesthetic. Because she did not get any significant benefit with the duration of the  local anesthetic, this would suggest that the pain is not really coming from the injected area but it is likely to come from elsewhere. In the case of her low back pain, the possibilities for this pain where presumed to be the facet joints as well as intraspinal problems in the upper lumbar and lower thoracic region. In specific, the patient has mild to moderate spinal stenosis at the T10-11, L1-2, and L4-5 levels. The T10-11 as well as the L1-2 levels can be responsible for some of this low back pain. Because of the patient's diabetes, she requested that we use no steroids on her last injection. This is probably reason why she did not obtain any long-term benefit from this last injection compared to prior ones. She also continues to have a number of other medical problems with her liver and kidney which are limiting [place] in terms of the medications that we can prescribe. In addition to this, we have that we received a phone call from her sister-in-law indicating that she was very concerned about the opioids that Lori Mckenzie and her son have been getting from this clinic. She identified herself and she indicated that in the case of this patient, she believed that she was selling her narcotics for [place]. When we received this information we went over some of her urine drug screening tests and I identified a pattern where she had no oxycodone or metabolites present in her last 3 urine drug screening test. This by itself is sufficient evidence that she has not been taking the medication and therefore I decided to taper her down and discontinue it. Because of this, I will not be starting the patient back on any type of opioids.  At this point, because my theory is that her pain is probably coming from her T10-11  and/or L1-2 levels, my next step would be to do either a low thoracic or high lumbar epidural steroid injection under fluoroscopic guidance. Unfortunately, because of her diabetes, she is at high risk of developing hyperglycemia and therefore I will hold on this until I get the okay from her primary care physician. Today I went over some of the patient's lab work and clearly she has significant medical problems that need to be addressed before we do anything else. Because her MRI does not show any clear compression of the spinal cord and she does not have symptoms compatible with a full radiculopathy, I have informed the patient that although we could send her to be seen by a neurosurgeon, it is very likely that they may decide to hold on any possible surgery due to the fact that her pathology does not seem to be bad enough to warrant surgery in addition to this she has a lot of medical problems that may prevent her from safely undergoing a major surgical procedure. Today I took time to encourage the patient to lose weight and to try to bring it below a BMI of 30. I believe that doing this will probably improve her diabetes as well as liver function, allowing [place] then to treat her with the steroids. For now, there is not much that we can do for her and we have informed the patient of that.  Further details on both, my assessment(s), as well as the proposed treatment plan, please see below.  Post-Procedure Assessment  05/18/2016 Procedure: Diagnostic bilateral lumbar facet block under fluoroscopic guidance and IV sedation without any steroids. (This is a second diagnostic lumbar facet block on the left  side but the first on the right side.) Pre-procedure pain score:  5/10 Post-procedure pain score: 0/10 (100% relief) Influential Factors: BMI: 47.55 kg/m Intra-procedural challenges: None observed Assessment challenges: None detected         Post-procedural side-effects, adverse reactions, or complications:  None reported Reported issues: Transient post-op increase in pain  Sedation: Please see nurses note. When no sedatives are used, the analgesic levels obtained are directly associated to the effectiveness of the local anesthetics. However, when sedation is provided, the level of analgesia obtained during the initial 1 hour following the intervention, is believed to be the result of a combination of factors. These factors may include, but are not limited to: 1. The effectiveness of the local anesthetics used. 2. The effects of the analgesic(s) and/or anxiolytic(s) used. 3. The degree of discomfort experienced by the patient at the time of the procedure. 4. The patients ability and reliability in recalling and recording the events. 5. The presence and influence of possible secondary gains and/or psychosocial factors. Reported result: Relief experienced during the 1st hour after the procedure: 100 % (Ultra-Short Term Relief) Interpretative annotation: Analgesia during this period is likely to be Local Anesthetic and/or IV Sedative (Analgesic/Anxiolitic) related.          Effects of local anesthetic: The analgesic effects attained during this period are directly associated to the localized infiltration of local anesthetics and therefore cary significant diagnostic value as to the etiological location, or anatomical origin, of the pain. Expected duration of relief is directly dependent on the pharmacodynamics of the local anesthetic used. Long-acting (4-6 hours) anesthetics used.  Reported result: Relief during the next 4 to 6 hour after the procedure: 0 % (30 -34mnutes  after leaving here ; was in extreme pain on the way to [place] (Short-Term Relief) Interpretative annotation: Complete relief would suggest area to be the source of the pain.          Long-term benefit: Defined as the period of time past the expected duration of local anesthetics. With the possible exception of prolonged sympathetic  blockade from the local anesthetics, benefits during this period are typically attributed to, or associated with, other factors such as analgesic sensory neuropraxia, antiinflammatory effects, or beneficial biochemical changes provided by agents other than the local anesthetics Reported result: Extended relief following procedure: 0 % (Long-Term Relief) Interpretative annotation: No long-term benefit expected. Ms. STamala Julianrequested to have no steroids injected          Current benefits: Defined as persistent relief that continues at this point in time.   Reported results: Treated area: 0 %       Interpretative annotation: No long-term benefit expected as the patient requested that no steroids be injected  Interpretation: Results would suggest a successful diagnostic intervention.          Laboratory Chemistry  Inflammation Markers Lab Results  Component Value Date   CRP <0.8 04/12/2016   ESRSEDRATE 4 04/12/2016   (CRP: Acute Phase) (ESR: Chronic Phase) Renal Function Markers Lab Results  Component Value Date   BUN 15 08/19/2015   CREATININE 1.09 (H) 08/19/2015   GFRAA >60 08/19/2015   GFRNONAA 57 (L) 08/19/2015   Hepatic Function Markers Lab Results  Component Value Date   AST 65 (H) 08/19/2015   ALT 82 (H) 08/19/2015   ALBUMIN 4.8 08/19/2015   ALKPHOS 116 08/19/2015   Electrolytes Lab Results  Component Value Date   NA 135 08/19/2015   K 4.3 08/19/2015   CL  99 (L) 08/19/2015   CALCIUM 9.4 08/19/2015   MG 1.9 01/08/2015   Neuropathy Markers Lab Results  Component Value Date   VITAMINB12 272 04/12/2016   Bone Pathology Markers Lab Results  Component Value Date   ALKPHOS 116 08/19/2015   VD25OH 14.3 (L) 01/08/2015   VD125OH2TOT 28.8 01/08/2015   25OHVITD1 36 04/12/2016   25OHVITD2 3.9 04/12/2016   25OHVITD3 32 04/12/2016   CALCIUM 9.4 08/19/2015   Coagulation Parameters Lab Results  Component Value Date   INR 1.18 12/01/2015   LABPROT 15.1 12/01/2015   APTT  24.6 08/26/2013   PLT 98 (L) 12/01/2015   Cardiovascular Markers Lab Results  Component Value Date   BNP 11 12/11/2012   HGB 15.4 12/01/2015   HCT 43.5 12/01/2015   Note: Lab results reviewed.  Recent Diagnostic Imaging Review  Dg C-arm 1-60 Min-no Report  Result Date: 05/16/2016 Fluoroscopy was utilized by the requesting physician.  No radiographic interpretation.   Note: Imaging results reviewed.          Meds  The patient has a current medication list which includes the following prescription(s): albuterol, alprazolam, cholecalciferol, citalopram, diclofenac sodium, dicyclomine, fluticasone, intrinsi b12-folate, furosemide, furosemide, gabapentin, insulin aspart, insulin detemir, ipratropium, lactulose, naloxone, nystatin, pantoprazole, potassium chloride sa, promethazine, rifaximin, rizatriptan, spironolactone, and vitamin b-12.  Current Outpatient Prescriptions on File Prior to Visit  Medication Sig  . albuterol (ACCUNEB) 0.63 MG/3ML nebulizer solution Inhale 1 ampule by nebulization every six (6) hours as needed for wheezing.  Marland Kitchen ALPRAZolam (XANAX) 1 MG tablet Take 1-2 mg by mouth 3 (three) times daily as needed for anxiety.  . Cholecalciferol (VITAMIN D-1000 MAX ST) 1000 units tablet Take 2,000 Units by mouth daily.   . citalopram (CELEXA) 20 MG tablet Take 20 mg by mouth daily.   . diclofenac sodium (VOLTAREN) 1 % GEL Apply 4 g topically 4 (four) times daily.  Marland Kitchen dicyclomine (BENTYL) 10 MG capsule Take 10 mg by mouth 4 (four) times daily -  before meals and at bedtime.   . fluticasone (FLONASE) 50 MCG/ACT nasal spray Place 1-2 sprays into both nostrils daily as needed for rhinitis.   Derald Macleod Factor (INTRINSI B12-FOLATE) [phone] MCG-MCG-MG TABS Take 1 tablet by mouth daily. Reported on 04/06/2015  . furosemide (LASIX) 40 MG tablet Take 80 mg by mouth as needed.   . gabapentin (NEURONTIN) 300 MG capsule Take 900 mg by mouth 4 (four) times daily as needed (for  pain).   . insulin aspart (NOVOLOG) 100 UNIT/ML injection Inject 8 Units into the skin 3 (three) times daily before meals.   . Insulin Detemir (LEVEMIR FLEXPEN [place]) Inject 45 Units into the skin.   Marland Kitchen ipratropium (ATROVENT) 0.02 % nebulizer solution Inhale 500 mcg by nebulization Four (4) times a day.  . lactulose (CHRONULAC) 10 GM/15ML solution Take 30 g by mouth 3 (three) times daily as needed.   . naloxone [facility]) 2 MG/2ML injection Inject into the vein as needed.  . nystatin (MYCOSTATIN) powder Apply 1 g topically 3 (three) times daily as needed (for irritation).   . pantoprazole (PROTONIX) 40 MG tablet Take 40 mg by mouth daily.   . promethazine (PHENERGAN) 12.5 MG tablet Take 12.5 mg by mouth every 6 (six) hours as needed for nausea or vomiting.   . rifaximin (XIFAXAN) 550 MG TABS tablet Take 550 mg by mouth 2 (two) times daily.   . rizatriptan (MAXALT) 10 MG tablet Take 10 mg by mouth as needed for migraine.   Marland Kitchen  spironolactone (ALDACTONE) 100 MG tablet Take 100 mg by mouth daily.   . vitamin B-12 (CYANOCOBALAMIN) 1000 MCG tablet Take 2,000 mcg by mouth daily.  . [DISCONTINUED] SUMAtriptan (IMITREX) 50 MG tablet Take 50 mg by mouth every 2 (two) hours as needed. For migraines   No current facility-administered medications on file prior to visit.    ROS  Constitutional: Denies any fever or chills Gastrointestinal: No reported hemesis, hematochezia, vomiting, or acute GI distress Musculoskeletal: Denies any acute onset joint swelling, redness, loss of ROM, or weakness Neurological: No reported episodes of acute onset apraxia, aphasia, dysarthria, agnosia, amnesia, paralysis, loss of coordination, or loss of consciousness  Allergies  Lori Mckenzie is allergic to tape and vicodin [hydrocodone-acetaminophen].  PFSH  Drug: Lori Mckenzie  reports that she does not use drugs. Alcohol:  reports that she does not drink alcohol. Tobacco:  reports that she has never smoked. She has never used smokeless  tobacco. Medical:  has a past medical history of Abdominal abscess (08/25/2014); Acid reflux (08/10/2010); Anxiety; Ascites; Asthma; Back pain; Bile leak, postoperative (07/17/2014); Brittle bone disease; Cancer ([place]); Cervical disc disease; Chronic kidney disease; Collagen vascular disease ([place]); COPD (chronic obstructive pulmonary disease) ([place]); Diabetes mellitus without complication ([place]); GERD (gastroesophageal reflux disease); Hypertension; Hypothyroidism; Left upper quadrant pain (01/09/2014); Major depressive disorder with single episode (12/05/2011); Major depressive disorder, single episode (12/05/2011); Migraines; NASH (nonalcoholic steatohepatitis); Respiratory infection; Shock ([place]) (09/18/2014); Sleep apnea; Sleep apnea; Syncope (11/16/2014); Thyroid disease; and TIA (transient ischemic attack). Family: family history includes Heart disease in her brother and sister; Lung cancer in her father; Ulcers in her father and sister.  Past Surgical History:  Procedure Laterality Date  . ABDOMINAL HYSTERECTOMY    . CHOLECYSTECTOMY N/A 07/15/2014   Procedure: LAPAROSCOPIC CHOLECYSTECTOMY with liver biopsy ;  Surgeon: Sherri Rad, MD;  Location: [facility] ORS;  Service: General;  Laterality: N/A;  . COLONOSCOPY WITH PROPOFOL N/A 06/23/2014   Procedure: COLONOSCOPY WITH PROPOFOL;  Surgeon: Lollie Sails, MD;  Location: [facility] ENDOSCOPY;  Service: Endoscopy;  Laterality: N/A;  . ERCP N/A 07/16/2014   Procedure: ENDOSCOPIC RETROGRADE CHOLANGIOPANCREATOGRAPHY (ERCP);  Surgeon: Clarene Essex, MD;  Location: [facility] ENDOSCOPY;  Service: Endoscopy;  Laterality: N/A;  . ERCP N/A 10/03/2014   Procedure: ENDOSCOPIC RETROGRADE CHOLANGIOPANCREATOGRAPHY (ERCP);  Surgeon: Hulen Luster, MD;  Location: [facility] ENDOSCOPY;  Service: Gastroenterology;  Laterality: N/A;  . ESOPHAGOGASTRODUODENOSCOPY N/A 06/23/2014   Procedure: ESOPHAGOGASTRODUODENOSCOPY (EGD);  Surgeon: Lollie Sails, MD;  Location: [facility] ENDOSCOPY;  Service: Endoscopy;   Laterality: N/A;  . ESOPHAGOGASTRODUODENOSCOPY (EGD) WITH PROPOFOL N/A 12/01/2015   Procedure: ESOPHAGOGASTRODUODENOSCOPY (EGD) WITH PROPOFOL;  Surgeon: Lollie Sails, MD;  Location: [facility] ENDOSCOPY;  Service: Endoscopy;  Laterality: N/A;  . TUBAL LIGATION    . WISDOM TOOTH EXTRACTION     Constitutional Exam  General appearance: Well nourished, well developed, and well hydrated. In no apparent acute distress Vitals:   05/19/16 1042  BP: 132/70  Pulse: (!) 110  Resp: 16  Temp: 98.5 F (36.9 C)  SpO2: 99%  Weight: 260 lb (117.9 kg)  Height: 5' 2"  (1.575 m)   BMI Assessment: Estimated body mass index is 47.55 kg/m as calculated from the following:   Height as of this encounter: 5' 2"  (1.575 m).   Weight as of this encounter: 260 lb (117.9 kg).  BMI interpretation table: BMI level Category Range association with higher incidence of chronic pain  <18 kg/m2 Underweight   18.5-24.9 kg/m2 Ideal body weight   25-29.9 kg/m2  Overweight Increased incidence by 20%  30-34.9 kg/m2 Obese (Class I) Increased incidence by 68%  35-39.9 kg/m2 Severe obesity (Class II) Increased incidence by 136%  >40 kg/m2 Extreme obesity (Class III) Increased incidence by 254%   BMI Readings from Last 4 Encounters:  05/19/16 47.55 kg/m  05/16/16 47.55 kg/m  04/21/16 48.47 kg/m  04/12/16 45.73 kg/m   Wt Readings from Last 4 Encounters:  05/19/16 260 lb (117.9 kg)  05/16/16 260 lb (117.9 kg)  04/21/16 265 lb (120.2 kg)  04/12/16 250 lb (113.4 kg)  Psych/Mental status: Alert, oriented x 3 (person, place, & time)       Eyes: PERLA Respiratory: No evidence of acute respiratory distress  Cervical Spine Exam  Inspection: No masses, redness, or swelling Alignment: Symmetrical Functional ROM: Unrestricted ROM      Stability: No instability detected Muscle strength & Tone: Functionally intact Sensory: Unimpaired Palpation: No palpable anomalies              Upper Extremity (UE) Exam    Side: Right  upper extremity  Side: Left upper extremity  Inspection: No masses, redness, swelling, or asymmetry. No contractures  Inspection: No masses, redness, swelling, or asymmetry. No contractures  Functional ROM: Unrestricted ROM          Functional ROM: Unrestricted ROM          Muscle strength & Tone: Functionally intact  Muscle strength & Tone: Functionally intact  Sensory: Unimpaired  Sensory: Unimpaired  Palpation: No palpable anomalies              Palpation: No palpable anomalies              Specialized Test(s): Deferred         Specialized Test(s): Deferred          Thoracic Spine Exam  Inspection: No masses, redness, or swelling Alignment: Symmetrical Functional ROM: Unrestricted ROM Stability: No instability detected Sensory: Unimpaired Muscle strength & Tone: No palpable anomalies  Lumbar Spine Exam  Inspection: No masses, redness, or swelling Alignment: Symmetrical Functional ROM: Unrestricted ROM      Stability: No instability detected Muscle strength & Tone: Functionally intact Sensory: Unimpaired Palpation: No palpable anomalies       Provocative Tests: Lumbar Hyperextension and rotation test: evaluation deferred today       Patrick's Maneuver: evaluation deferred today                    Gait & Posture Assessment  Ambulation: Unassisted Gait: Relatively normal for age and body habitus Posture: WNL   Lower Extremity Exam    Side: Right lower extremity  Side: Left lower extremity  Inspection: No masses, redness, swelling, or asymmetry. No contractures  Inspection: No masses, redness, swelling, or asymmetry. No contractures  Functional ROM: Unrestricted ROM          Functional ROM: Unrestricted ROM          Muscle strength & Tone: Functionally intact  Muscle strength & Tone: Functionally intact  Sensory: Unimpaired  Sensory: Unimpaired  Palpation: No palpable anomalies  Palpation: No palpable anomalies   Assessment  Primary Diagnosis & Pertinent Problem List: The  primary encounter diagnosis was Chronic low back pain (Location of Primary Source of Pain) (Bilateral) (R>L). Diagnoses of Acute postoperative pain, Subacute lumbar radiculopathy (left side) (S1 dermatome), Chronic pain of lower extremity, bilateral, Spinal stenosis, thoracic region (T10-11), and Spinal stenosis, lumbar region without neurogenic claudication (L1-2 and L4-5) were also pertinent to  this visit.  Status Diagnosis  Not improving Not improving Not improving 1. Chronic low back pain (Location of Primary Source of Pain) (Bilateral) (R>L)   2. Acute postoperative pain   3. Subacute lumbar radiculopathy (left side) (S1 dermatome)   4. Chronic pain of lower extremity, bilateral   5. Spinal stenosis, thoracic region (T10-11)   6. Spinal stenosis, lumbar region without neurogenic claudication (L1-2 and L4-5)     Problems updated and reviewed during this visit: Problem  Acute Postoperative Pain  Chronic Pain of Lower Extremity, Bilateral  Spinal stenosis, thoracic region (T10-11)   Moderate lower thoracic spinal stenosis at T10-T11   Spinal stenosis, lumbar region without neurogenic claudication (L1-2 and L4-5)   Plan of Care  Pharmacotherapy (Medications Ordered): No orders of the defined types were placed in this encounter.  New Prescriptions   No medications on file   Medications administered today: Lori Mckenzie had no medications administered during this visit. Lab-work, procedure(s), and/or referral(s): No orders of the defined types were placed in this encounter.  Imaging and/or referral(s): None  Interventional therapies: Planned, scheduled, and/or pending:   Not at this time.   Considering:   Diagnostic bilateral cervical facet block Diagnostic L1-2 lumbar epidural steroid injection Diagnostic, right-sided L4-5 lumbar epidural steroid injection Diagnostic bilateral intra-articular shoulder joint injection Diagnostic bilateral suprascapular nerve block Possible  bilateral suprascapular nerve radiofrequency ablation Palliative left-sided L5-S1 lumbar epidural steroid injection  Diagnostic left S1 selective nerve root block Diagnostic right-sided cervical epidural steroid injection Diagnostic T10-11 thoracic epidural steroid injection Diagnostic bilateral intra-articular hip joint injection Possible bilateral hip radiofrequency ablation   Palliative PRN treatment(s):   Diagnostic L1-2 lumbar epidural steroid injection Diagnostic, right-sided L4-5 lumbar epidural steroid injection Diagnostic T10-11 thoracic epidural steroid injection    Provider-requested follow-up: Return if symptoms worsen or fail to improve, for PRN procedure(s), by MD.  Future Appointments Date Time Provider [place]  06/02/2016 10:00 AM Milinda Pointer, MD [facility] None   Primary Care Physician: Ardine Eng, MD Location: [facility] Note by: Kathlen Brunswick. Dossie Arbour, M.D, DABA, DABAPM, DABPM, DABIPP, FIPP Date: 05/19/2016; Time: 3:30 PM  Patient instructions provided during this appointment: Patient Instructions  _______________________________________________________________  Preparing for Procedure with Sedation Instructions: . Oral Intake: Do not eat or drink anything for at least 8 hours prior to your procedure. . Transportation: Public transportation is not allowed. Bring an adult driver. The driver must be physically present in our waiting room before any procedure can be started. Marland Kitchen Physical Assistance: Bring an adult physically capable of assisting you, in the event you need help. This adult should keep you company at home for at least 6 hours after the procedure. . Blood Pressure Medicine: Take your blood pressure medicine with a sip of water the morning of the procedure. . Blood thinners:  . Diabetics on insulin: Notify the staff so that you can be scheduled 1st case in the morning. If your diabetes requires high dose  insulin, take only  of your normal insulin dose the morning of the procedure and notify the staff that you have done so. . Preventing infections: Shower with an antibacterial soap the morning of your procedure. . Build-up your immune system: Take 1000 mg of Vitamin C with every meal (3 times a day) the day prior to your procedure. Marland Kitchen Antibiotics: Inform the staff if you have a condition or reason that requires you to take antibiotics before dental procedures. . Pregnancy: If you are pregnant, call  and cancel the procedure. . Sickness: If you have a cold, fever, or any active infections, call and cancel the procedure. . Arrival: You must be in the facility at least 30 minutes prior to your scheduled procedure. . Children: Do not bring children with you. . Dress appropriately: Bring dark clothing that you would not mind if they get stained. . Valuables: Do not bring any jewelry or valuables. Procedure appointments are reserved for interventional treatments only. Marland Kitchen No Prescription Refills. . No medication changes will be discussed during procedure appointments. . No disability issues will be discussed. ______________________________________________________________________________________________  Post-Procedure instructions Instructions:  Apply ice: Fill a plastic sandwich bag with crushed ice. Cover it with a small towel and apply to injection site. Apply for 15 minutes then remove x 15 minutes. Repeat sequence on day of procedure, until you go to bed. The purpose is to minimize swelling and discomfort after procedure.  Apply heat: Apply heat to procedure site starting the day following the procedure. The purpose is to treat any soreness and discomfort from the procedure.  Food intake: Start with clear liquids (like water) and advance to regular food, as tolerated.   Physical activities: Keep activities to a minimum for the first 8 hours after the procedure.   Driving: If you have received  any sedation, you are not allowed to drive for 24 hours after your procedure.  Blood thinner: Restart your blood thinner 6 hours after your procedure. (Only for those taking blood thinners)  Insulin: As soon as you can eat, you may resume your normal dosing schedule. (Only for those taking insulin)  Infection prevention: Keep procedure site clean and dry.  Post-procedure Pain Diary: Extremely important that this be done correctly and accurately. Recorded information will be used to determine the next step in treatment.  Pain evaluated is that of treated area only. Do not include pain from an untreated area.  Complete every hour, on the hour, for the initial 8 hours. Set an alarm to help you do this part accurately.  Do not go to sleep and have it completed later. It will not be accurate.  Follow-up appointment: Keep your follow-up appointment after the procedure. Usually 2 weeks for most procedures. (6 weeks in the case of radiofrequency.) Bring you pain diary.  Expect:  From numbing medicine (AKA: Local Anesthetics): Numbness or decrease in pain.  Onset: Full effect within 15 minutes of injected.  Duration: It will depend on the type of local anesthetic used. On the average, 1 to 8 hours.   From steroids: Decrease in swelling or inflammation. Once inflammation is improved, relief of the pain will follow.  Onset of benefits: Depends on the amount of swelling present. The more swelling, the longer it will take for the benefits to be seen.   Duration: Steroids will stay in the system x 2 weeks. Duration of benefits will depend on multiple posibilities including persistent irritating factors.  From procedure: Some discomfort is to be expected once the numbing medicine wears off. This should be minimal if ice and heat are applied as instructed. Call if:  You experience numbness and weakness that gets worse with time, as opposed to wearing off.  New onset bowel or bladder incontinence.  (Spinal procedures only)  Emergency Numbers:  [place] business hours (Monday - Thursday, 8:00 AM - 4:00 PM) (Friday, 9:00 AM - 12:00 Noon): [phone]  After hours: [phone] _____________________________________________________________________________________________

## 2016-05-31 ENCOUNTER — Telehealth: Payer: Self-pay

## 2016-05-31 NOTE — Telephone Encounter (Signed)
na

## 2016-05-31 NOTE — Telephone Encounter (Signed)
Called patient back, Lori Mckenzie states that she is in severe pain in her back and needs helps. Pt went to see her PCP after her last appointment with Dr Dossie Arbour on 05/19/16.  She saw Dr Wallis Bamberg at [facility]). He then instructed her to start taking Gabapentin 312m 2 tabs po QID. Ethal informed Dr AWallis Bambergof her options that was given to her at last appointment. Option #1 Surgery. Option#2 Procedure with Steroids. Option 3# Nothing.  At that time Dr AWallis Bamberginformed Lori STamala Julianthat she would need to see you and would be available by phone at [phone] discuss Blood Sugars issues.  Lori STamala Julianwould like to have procedure with steroids done, as surgery is not an option due to fiances. Lori STamala Julianwanted to know if Dr NDossie Arbourcould keep her overnight for observation to monitor her BS. I told her that he would probably not be able to admit her and that she would need to monitor BS at home. Pt with understanding.   Please advise on what we should do

## 2016-05-31 NOTE — Telephone Encounter (Signed)
Patient would like to speak with a nurse patient says she is in pain. Patient says she spoke with PCP and he told her to speak with Dr Dossie Arbour. Please call patient she seemed a little confused.

## 2016-05-31 NOTE — Telephone Encounter (Signed)
Patient needs to be set up for a Bilateral Lumbar facet, SI injection with sedation per Dr Dossie Arbour- called pt to inform to call in am to schedule appointment  Talked with patient again about the gabapentin and decreasing and she states that she decreased on her on without being advised by MD.

## 2016-06-02 ENCOUNTER — Ambulatory Visit: Payer: Medicaid Other | Admitting: Pain Medicine

## 2016-06-06 ENCOUNTER — Ambulatory Visit (HOSPITAL_BASED_OUTPATIENT_CLINIC_OR_DEPARTMENT_OTHER): Payer: Medicaid Other | Admitting: Pain Medicine

## 2016-06-06 ENCOUNTER — Ambulatory Visit
Admission: RE | Admit: 2016-06-06 | Discharge: 2016-06-06 | Disposition: A | Discharge status: Home / Self Care | Payer: Medicaid Other | Source: Ambulatory Visit | Attending: Pain Medicine | Admitting: Pain Medicine

## 2016-06-06 ENCOUNTER — Encounter: Payer: Self-pay | Admitting: Pain Medicine

## 2016-06-06 ENCOUNTER — Ambulatory Visit: Payer: Medicaid Other | Admitting: Pain Medicine

## 2016-06-06 VITALS — BP 111/66 | HR 87 | Temp 97.6°F | Resp 14 | Ht 62.0 in | Wt 260.0 lb

## 2016-06-06 DIAGNOSIS — M47816 Spondylosis without myelopathy or radiculopathy, lumbar region: Secondary | ICD-10-CM

## 2016-06-06 DIAGNOSIS — M4696 Unspecified inflammatory spondylopathy, lumbar region: Secondary | ICD-10-CM

## 2016-06-06 DIAGNOSIS — M533 Sacrococcygeal disorders, not elsewhere classified: Secondary | ICD-10-CM | POA: Insufficient documentation

## 2016-06-06 DIAGNOSIS — M5441 Lumbago with sciatica, right side: Secondary | ICD-10-CM | POA: Insufficient documentation

## 2016-06-06 DIAGNOSIS — G8929 Other chronic pain: Secondary | ICD-10-CM

## 2016-06-06 DIAGNOSIS — R110 Nausea: Secondary | ICD-10-CM

## 2016-06-06 DIAGNOSIS — M5442 Lumbago with sciatica, left side: Secondary | ICD-10-CM

## 2016-06-06 MED ORDER — LIDOCAINE HCL (PF) 1 % IJ SOLN
INTRAMUSCULAR | Status: AC
  Filled 2016-06-06: qty 10

## 2016-06-06 MED ORDER — FENTANYL CITRATE (PF) 100 MCG/2ML IJ SOLN
25.0000 ug | INTRAMUSCULAR | Status: DC | PRN
  Administered 2016-06-06: 50 ug via INTRAVENOUS
  Filled 2016-06-06: qty 2

## 2016-06-06 MED ORDER — ROPIVACAINE HCL 2 MG/ML IJ SOLN
INTRAMUSCULAR | Status: AC
  Filled 2016-06-06: qty 30

## 2016-06-06 MED ORDER — ONDANSETRON HCL 4 MG/2ML IJ SOLN
INTRAMUSCULAR | Status: AC
  Filled 2016-06-06: qty 2

## 2016-06-06 MED ORDER — ROPIVACAINE HCL 2 MG/ML IJ SOLN
9.0000 mL | Freq: Once | INTRAMUSCULAR | Status: AC
  Administered 2016-06-06: 10 mL via INTRA_ARTICULAR

## 2016-06-06 MED ORDER — ONDANSETRON HCL 4 MG/2ML IJ SOLN: 4.0000 mg | INTRAMUSCULAR | Status: DC | PRN

## 2016-06-06 MED ORDER — LACTATED RINGERS IV SOLN
1000.0000 mL | Freq: Once | INTRAVENOUS | Status: AC
  Administered 2016-06-06: 1000 mL via INTRAVENOUS

## 2016-06-06 MED ORDER — METHYLPREDNISOLONE ACETATE 40 MG/ML IJ SUSP
INTRAMUSCULAR | Status: AC
  Filled 2016-06-06: qty 1

## 2016-06-06 MED ORDER — TRIAMCINOLONE ACETONIDE 40 MG/ML IJ SUSP
INTRAMUSCULAR | Status: AC
  Filled 2016-06-06: qty 2

## 2016-06-06 MED ORDER — TRIAMCINOLONE ACETONIDE 40 MG/ML IJ SUSP
40.0000 mg | Freq: Once | INTRAMUSCULAR | Status: AC
  Administered 2016-06-06: 40 mg

## 2016-06-06 MED ORDER — LIDOCAINE HCL (PF) 1 % IJ SOLN
10.0000 mL | Freq: Once | INTRAMUSCULAR | Status: AC
  Administered 2016-06-06: 5 mL

## 2016-06-06 MED ORDER — MIDAZOLAM HCL 5 MG/5ML IJ SOLN
1.0000 mg | INTRAMUSCULAR | Status: DC | PRN
  Administered 2016-06-06: 2 mg via INTRAVENOUS
  Filled 2016-06-06: qty 5

## 2016-06-06 MED ORDER — METHYLPREDNISOLONE ACETATE 40 MG/ML IJ SUSP
40.0000 mg | Freq: Once | INTRAMUSCULAR | Status: AC
  Administered 2016-06-06: 40 mg via INTRA_ARTICULAR

## 2016-06-06 MED ORDER — ROPIVACAINE HCL 2 MG/ML IJ SOLN
9.0000 mL | Freq: Once | INTRAMUSCULAR | Status: AC
  Administered 2016-06-06: 10 mL via PERINEURAL

## 2016-06-06 NOTE — Addendum Note (Signed)
Addended by: Milinda Pointer A on: 06/06/2016 04:28 PM   Modules accepted: Orders

## 2016-06-06 NOTE — Patient Instructions (Addendum)
____________________________________________________________________________________________  Post-Procedure instructions Instructions:  Apply ice: Fill a plastic sandwich bag with crushed ice. Cover it with a small towel and apply to injection site. Apply for 15 minutes then remove x 15 minutes. Repeat sequence on day of procedure, until you go to bed. The purpose is to minimize swelling and discomfort after procedure.  Apply heat: Apply heat to procedure site starting the day following the procedure. The purpose is to treat any soreness and discomfort from the procedure.  Food intake: Start with clear liquids (like water) and advance to regular food, as tolerated.   Physical activities: Keep activities to a minimum for the first 8 hours after the procedure.   Driving: If you have received any sedation, you are not allowed to drive for 24 hours after your procedure.  Blood thinner: Restart your blood thinner 6 hours after your procedure. (Only for those taking blood thinners)  Insulin: As soon as you can eat, you may resume your normal dosing schedule. (Only for those taking insulin)  Infection prevention: Keep procedure site clean and dry.  Post-procedure Pain Diary: Extremely important that this be done correctly and accurately. Recorded information will be used to determine the next step in treatment.  Pain evaluated is that of treated area only. Do not include pain from an untreated area.  Complete every hour, on the hour, for the initial 8 hours. Set an alarm to help you do this part accurately.  Do not go to sleep and have it completed later. It will not be accurate.  Follow-up appointment: Keep your follow-up appointment after the procedure. Usually 2 weeks for most procedures. (6 weeks in the case of radiofrequency.) Bring you pain diary.  Expect:  From numbing medicine (AKA: Local Anesthetics): Numbness or decrease in pain.  Onset: Full effect within 15 minutes of  injected.  Duration: It will depend on the type of local anesthetic used. On the average, 1 to 8 hours.   From steroids: Decrease in swelling or inflammation. Once inflammation is improved, relief of the pain will follow.  Onset of benefits: Depends on the amount of swelling present. The more swelling, the longer it will take for the benefits to be seen.   Duration: Steroids will stay in the system x 2 weeks. Duration of benefits will depend on multiple posibilities including persistent irritating factors.  From procedure: Some discomfort is to be expected once the numbing medicine wears off. This should be minimal if ice and heat are applied as instructed. Call if:  You experience numbness and weakness that gets worse with time, as opposed to wearing off.  New onset bowel or bladder incontinence. (Spinal procedures only)  Emergency Numbers:  [place] business hours (Monday - Thursday, 8:00 AM - 4:00 PM) (Friday, 9:00 AM - 12:00 Noon): [phone]  After hours: [phone] ____________________________________________________________________________________________  Pain Management Discharge Instructions  General Discharge Instructions :  If you need to reach your doctor call: Monday-Friday 8:00 am - 4:00 pm at [phone] or toll free [phone].  After clinic hours [phone] to have operator reach doctor.  Bring all of your medication bottles to all your appointments in the pain clinic.  To cancel or reschedule your appointment with Pain Management please remember to call 24 hours in advance to avoid a fee.  Refer to the educational materials which you have been given on: General Risks, I had my Procedure. Discharge Instructions, Post Sedation.  Post Procedure Instructions:  The drugs you were given will stay in your  system until tomorrow, so for the next 24 hours you should not drive, make any legal decisions or drink any alcoholic beverages.  You may eat  anything you prefer, but it is better to start with liquids then soups and crackers, and gradually work up to solid foods.  Please notify your doctor immediately if you have any unusual bleeding, trouble breathing or pain that is not related to your normal pain.  Depending on the type of procedure that was done, some parts of your body may feel week and/or numb.  This usually clears up by tonight or the next day.  Walk with the use of an assistive device or accompanied by an adult for the 24 hours.  You may use ice on the affected area for the first 24 hours.  Put ice in a Ziploc bag and cover with a towel and place against area 15 minutes on 15 minutes off.  You may switch to heat after 24 hours.

## 2016-06-06 NOTE — Progress Notes (Signed)
Safety precautions to be maintained throughout the outpatient stay will include: orient to surroundings, keep bed in low position, maintain call bell within reach at all times, provide assistance with transfer out of bed and ambulation.  

## 2016-06-06 NOTE — Progress Notes (Signed)
Patient's Name: Lori Mckenzie  MRN: [id number]  Referring Provider: Ardine Eng, MD  DOB: 01/27/1965  PCP: Ardine Eng, MD  DOS: 06/06/2016  Note by: Kathlen Brunswick. Dossie Arbour, MD  Service setting: Ambulatory outpatient  Location: [facility] (AMB) Pain Management Facility  Visit type: Procedure  Specialty: Interventional Pain Management  Patient type: Established   Primary Reason for Visit: Interventional Pain Management Treatment. CC: Back Pain (mid to low)  Procedure:  Anesthesia, Analgesia, Anxiolysis:  Procedure #1: Type: Diagnostic Medial Branch Facet Block Region: Lumbar Level: L2, L3, L4, L5, & S1 Medial Branch Level(s) Laterality: Bilateral  Procedure #2: Type: Diagnostic Sacroiliac Joint Block Region: Posterior Lumbosacral Level: PSIS (Posterior Superior Iliac Spine) Sacroiliac Joint Laterality: Bilateral  Type: Local Anesthesia with Moderate (Conscious) Sedation Local Anesthetic: Lidocaine 1% Route: Intravenous (IV) IV Access: Secured Sedation: Meaningful verbal contact was maintained at all times during the procedure  Indication(s): Analgesia and Anxiety  Indications: 1. Lumbar facet syndrome (Location of Primary Source of Pain) (Bilateral) (R>L)   2. Chronic sacroiliac joint pain (Bilateral) (L>R)   3. Lumbar spondylosis   4. Chronic low back pain (Location of Primary Source of Pain) (Bilateral) (R>L)    Pain Score: Pre-procedure: 4 /10 Post-procedure: 2 /10  Pre-op Assessment:  Previous date of service: 05/19/16 Service provided: Evaluation Lori Mckenzie is a 51 y.o. (year old), female patient, seen today for interventional treatment. She  has a past surgical history that includes Abdominal hysterectomy; Tubal ligation; Colonoscopy with propofol (N/A, 06/23/2014); Esophagogastroduodenoscopy (N/A, 06/23/2014); Cholecystectomy (N/A, 07/15/2014); ERCP (N/A, 07/16/2014); Wisdom tooth extraction; ERCP (N/A, 10/03/2014); and Esophagogastroduodenoscopy (egd) with propofol (N/A, 12/01/2015).  Her primarily concern today is the Back Pain (mid to low)  Note: The patient has a history of hyperglycemia after blocks using steroids. She indicates that she now has an endocrinologist and she had a conversation with her primary care physician in the endocrinologist to see if she could get the epidural steroid injections and have them assist in the management of the hyperglycemia, after the epidurals. They provided her with a "sliding scale" that she is now to use in the event that her blood sugar goes up.  Initial Vital Signs: Blood pressure 124/81, pulse (!) 105, temperature 98.2 F (36.8 C), resp. rate 16, height 5' 2"  (1.575 m), weight 260 lb (117.9 kg), SpO2 96 %. BMI: 47.55 kg/m  Risk Assessment: Allergies: Reviewed. She is allergic to tape and vicodin [hydrocodone-acetaminophen].  Allergy Precautions: None required Coagulopathies: Reviewed. None identified.  Blood-thinner therapy: None at this time Active Infection(s): Reviewed. None identified. Lori Mckenzie is afebrile  Site Confirmation: Lori Mckenzie was asked to confirm the procedure and laterality before marking the site Procedure checklist: Completed Consent: Before the procedure and under the influence of no sedative(s), amnesic(s), or anxiolytics, the patient was informed of the treatment options, risks and possible complications. To fulfill our ethical and legal obligations, as recommended by the American Medical Association's Code of Ethics, I have informed the patient of my clinical impression; the nature and purpose of the treatment or procedure; the risks, benefits, and possible complications of the intervention; the alternatives, including doing nothing; the risk(s) and benefit(s) of the alternative treatment(s) or procedure(s); and the risk(s) and benefit(s) of doing nothing. The patient was provided information about the general risks and possible complications associated with the procedure. These may include, but are not limited  to: failure to achieve desired goals, infection, bleeding, organ or nerve damage, allergic reactions, paralysis, and death. In addition, the  patient was informed of those risks and complications associated to Spine-related procedures, such as failure to decrease pain; infection (i.e.: Meningitis, epidural or intraspinal abscess); bleeding (i.e.: epidural hematoma, subarachnoid hemorrhage, or any other type of intraspinal or peri-dural bleeding); organ or nerve damage (i.e.: Any type of peripheral nerve, nerve root, or spinal cord injury) with subsequent damage to sensory, motor, and/or autonomic systems, resulting in permanent pain, numbness, and/or weakness of one or several areas of the body; allergic reactions; (i.e.: anaphylactic reaction); and/or death. Furthermore, the patient was informed of those risks and complications associated with the medications. These include, but are not limited to: allergic reactions (i.e.: anaphylactic or anaphylactoid reaction(s)); adrenal axis suppression; blood sugar elevation that in diabetics may result in ketoacidosis or comma; water retention that in patients with history of congestive heart failure may result in shortness of breath, pulmonary edema, and decompensation with resultant heart failure; weight gain; swelling or edema; medication-induced neural toxicity; particulate matter embolism and blood vessel occlusion with resultant organ, and/or nervous system infarction; and/or aseptic necrosis of one or more joints. Finally, the patient was informed that Medicine is not an exact science; therefore, there is also the possibility of unforeseen or unpredictable risks and/or possible complications that may result in a catastrophic outcome. The patient indicated having understood very clearly. We have given the patient no guarantees and we have made no promises. Enough time was given to the patient to ask questions, all of which were answered to the patient's satisfaction.  Lori Mckenzie has indicated that she wanted to continue with the procedure. Attestation: I, the ordering provider, attest that I have discussed with the patient the benefits, risks, side-effects, alternatives, likelihood of achieving goals, and potential problems during recovery for the procedure that I have provided informed consent. Date: 06/06/2016; Time: 11:22 AM  Pre-Procedure Preparation:  Monitoring: As per clinic protocol. Respiration, ETCO2, SpO2, BP, heart rate and rhythm monitor placed and checked for adequate function Safety Precautions: Patient was assessed for positional comfort and pressure points before starting the procedure. Time-out: I initiated and conducted the "Time-out" before starting the procedure, as per protocol. The patient was asked to participate by confirming the accuracy of the "Time Out" information. Verification of the correct person, site, and procedure were performed and confirmed by me, the nursing staff, and the patient. "Time-out" conducted as per Joint Commission's Universal Protocol (UP.01.01.01). "Time-out" Date & Time: 06/06/2016; 1152 hrs.  Description of Procedure #1 Process:   Time-out: "Time-out" completed before starting procedure, as per protocol. Position: Prone Target Area: For Lumbar Facet blocks, the target is the groove formed by the junction of the transverse process and superior articular process. For the L5 dorsal ramus, the target is the notch between superior articular process and sacral ala. For the S1 dorsal ramus, the target is the superior and lateral edge of the posterior S1 Sacral foramen. Approach: Paramedial approach. Area Prepped: Entire Posterior Lumbosacral Region Prepping solution: ChloraPrep (2% chlorhexidine gluconate and 70% isopropyl alcohol) Safety Precautions: Aspiration looking for blood return was conducted prior to all injections. At no point did we inject any substances, as a needle was being advanced. No attempts were made at  seeking any paresthesias. Safe injection practices and needle disposal techniques used. Medications properly checked for expiration dates. SDV (single dose vial) medications used.  Description of the Procedure: Protocol guidelines were followed. The patient was placed in position over the fluoroscopy table. The target area was identified and the area prepped in the usual manner.  Skin desensitized using vapocoolant spray. Skin & deeper tissues infiltrated with local anesthetic. Appropriate amount of time allowed to pass for local anesthetics to take effect. The procedure needle was introduced through the skin, ipsilateral to the reported pain, and advanced to the target area. Employing the "Medial Branch Technique", the needles were advanced to the angle made by the superior and medial portion of the transverse process, and the lateral and inferior portion of the superior articulating process of the targeted vertebral bodies. This area is known as "Burton's Eye" or the "Eye of the [place] Dog". A procedure needle was introduced through the skin, and this time advanced to the angle made by the superior and medial border of the sacral ala, and the lateral border of the S1 vertebral body. This last needle was later repositioned at the superior and lateral border of the posterior S1 foramen. Negative aspiration confirmed. Solution injected in intermittent fashion, asking for systemic symptoms every 0.5cc of injectate. The needles were then removed and the area cleansed, making sure to leave some of the prepping solution back to take advantage of its long term bactericidal properties. Start Time: 1152 hrs. Materials:  Needle(s) Type: Regular needle Gauge: 22G Length: 5-in Medication(s): We administered lactated ringers, midazolam, fentaNYL, triamcinolone acetonide, triamcinolone acetonide, lidocaine (PF), lidocaine (PF), ropivacaine (PF) 2 mg/mL (0.2%), ropivacaine (PF) 2 mg/mL (0.2%), methylPREDNISolone acetate,  and ropivacaine (PF) 2 mg/mL (0.2%). Please see chart orders for dosing details.  Description of Procedure # 2 Process:   Position: Prone Target Area: For upper sacroiliac joint block(s), the target is the superior and posterior margin of the sacroiliac joint. Approach: Ipsilateral approach. Area Prepped: Entire Posterior Lumbosacral Region Prepping solution: ChloraPrep (2% chlorhexidine gluconate and 70% isopropyl alcohol) Safety Precautions: Aspiration looking for blood return was conducted prior to all injections. At no point did we inject any substances, as a needle was being advanced. No attempts were made at seeking any paresthesias. Safe injection practices and needle disposal techniques used. Medications properly checked for expiration dates. SDV (single dose vial) medications used. Description of the Procedure: Protocol guidelines were followed. The patient was placed in position over the fluoroscopy table. The target area was identified and the area prepped in the usual manner. Skin desensitized using vapocoolant spray. Skin & deeper tissues infiltrated with local anesthetic. Appropriate amount of time allowed to pass for local anesthetics to take effect. The procedure needle was advanced under fluoroscopic guidance into the sacroiliac joint until a firm endpoint was obtained. Proper needle placement secured. Negative aspiration confirmed. Solution injected in intermittent fashion, asking for systemic symptoms every 0.5cc of injectate. The needles were then removed and the area cleansed, making sure to leave some of the prepping solution back to take advantage of its long term bactericidal properties. Vitals:   06/06/16 1159 06/06/16 1209 06/06/16 1219 06/06/16 1229  BP: (!) 134/104 106/67 119/70 111/66  Pulse: 95 93 90 87  Resp: (!) 21 17 20 14   Temp:  97.6 F (36.4 C)    SpO2: 96% 93% 93% 93%  Weight:      Height:        End Time: 1158 hrs. Materials:  Needle(s) Type: Regular  needle Gauge: 22G Length: 3.5-in Medication(s): We administered lactated ringers, midazolam, fentaNYL, triamcinolone acetonide, triamcinolone acetonide, lidocaine (PF), lidocaine (PF), ropivacaine (PF) 2 mg/mL (0.2%), ropivacaine (PF) 2 mg/mL (0.2%), methylPREDNISolone acetate, and ropivacaine (PF) 2 mg/mL (0.2%). Please see chart orders for dosing details.  Imaging Guidance (Spinal):  Type of Imaging  Technique: Fluoroscopy Guidance (Spinal) Indication(s): Assistance in needle guidance and placement for procedures requiring needle placement in or near specific anatomical locations not easily accessible without such assistance. Exposure Time: Please see nurses notes. Contrast: None used. Fluoroscopic Guidance: I was personally present during the use of fluoroscopy. "Tunnel Vision Technique" used to obtain the best possible view of the target area. Parallax error corrected before commencing the procedure. "Direction-depth-direction" technique used to introduce the needle under continuous pulsed fluoroscopy. Once target was reached, antero-posterior, oblique, and lateral fluoroscopic projection used confirm needle placement in all planes. Images permanently stored in EMR. Interpretation: No contrast injected. I personally interpreted the imaging intraoperatively. Adequate needle placement confirmed in multiple planes. Permanent images saved into the patient's record.  Antibiotic Prophylaxis:  Indication(s): None identified Antibiotic given: None  Post-operative Assessment:  EBL: None Complications: No immediate post-treatment complications observed by team, or reported by patient. Note: The patient tolerated the entire procedure well. A repeat set of vitals were taken after the procedure and the patient was kept under observation following institutional policy, for this type of procedure. Post-procedural neurological assessment was performed, showing return to baseline, prior to discharge. The  patient was provided with post-procedure discharge instructions, including a section on how to identify potential problems. Should any problems arise concerning this procedure, the patient was given instructions to immediately contact us, at any time, without hesitation. In any case, we plan to contact the patient by telephone for a follow-up status report regarding this interventional procedure. Comments:  No additional relevant information.  Plan of Care  Disposition: Discharge home  Discharge Date & Time: 06/06/2016;   hrs.  Physician-requested Follow-up:  Return for post-procedure eval (in 2 wks), w/ MD.  Future Appointments Date Time Provider [place]  07/05/2016 11:00 AM Milinda Pointer, MD [facility] None   Medications ordered for procedure: Meds ordered this encounter  Medications  . lactated ringers infusion 1,000 mL  . midazolam (VERSED) 5 MG/5ML injection 1-2 mg    Make sure Flumazenil is available in the pyxis when using this medication. If oversedation occurs, administer 0.2 mg IV over 15 sec. If after 45 sec no response, administer 0.2 mg again over 1 min; may repeat at 1 min intervals; not to exceed 4 doses (1 mg)  . fentaNYL (SUBLIMAZE) injection 25-50 mcg    Make sure Narcan is available in the pyxis when using this medication. In the event of respiratory depression (RR< 8/min): Titrate NARCAN (naloxone) in increments of 0.1 to 0.2 mg IV at 2-3 minute intervals, until desired degree of reversal.  . triamcinolone acetonide (KENALOG-40) injection 40 mg  . triamcinolone acetonide (KENALOG-40) injection 40 mg  . lidocaine (PF) (XYLOCAINE) 1 % injection 10 mL  . lidocaine (PF) (XYLOCAINE) 1 % injection 10 mL  . ropivacaine (PF) 2 mg/mL (0.2%) (NAROPIN) injection 9 mL  . ropivacaine (PF) 2 mg/mL (0.2%) (NAROPIN) injection 9 mL  . methylPREDNISolone acetate (DEPO-MEDROL) injection 40 mg  . ropivacaine (PF) 2 mg/mL (0.2%) (NAROPIN) injection 9 mL   Medications  administered: We administered lactated ringers, midazolam, fentaNYL, triamcinolone acetonide, triamcinolone acetonide, lidocaine (PF), lidocaine (PF), ropivacaine (PF) 2 mg/mL (0.2%), ropivacaine (PF) 2 mg/mL (0.2%), methylPREDNISolone acetate, and ropivacaine (PF) 2 mg/mL (0.2%).  See the medical record for exact dosing, route, and time of administration.  Lab-work, Procedure(s), & Referral(s) Ordered: Orders Placed This Encounter  Procedures  . LUMBAR FACET(MEDIAL BRANCH NERVE BLOCK) MBNB  . SACROILIAC JOINT INJECTINS  . DG C-Arm 1-60 Min-No Report  .  Informed Consent Details: Transcribe to consent form and obtain patient signature  . Provider attestation of informed consent for procedure/surgical case  . Verify informed consent  . Discharge instructions  . Follow-up   Imaging Ordered: Results for orders placed in visit on 05/16/16  DG C-Arm 1-60 Min-No Report   Narrative Fluoroscopy was utilized by the requesting physician.  No radiographic  interpretation.    New Prescriptions   No medications on file   Primary Care Physician: Ardine Eng, MD Location: [facility] Note by: Kathlen Brunswick. Dossie Arbour, M.D, DABA, DABAPM, DABPM, DABIPP, FIPP Date: 06/06/2016; Time: 1:50 PM  Disclaimer:  Medicine is not an exact science. The only guarantee in medicine is that nothing is guaranteed. It is important to note that the decision to proceed with this intervention was based on the information collected from the patient. The Data and conclusions were drawn from the patient's questionnaire, the interview, and the physical examination. Because the information was provided in large part by the patient, it cannot be guaranteed that it has not been purposely or unconsciously manipulated. Every effort has been made to obtain as much relevant data as possible for this evaluation. It is important to note that the conclusions that lead to this procedure are derived in large part  from the available data. Always take into account that the treatment will also be dependent on availability of resources and existing treatment guidelines, considered by other Pain Management Practitioners as being common knowledge and practice, at the time of the intervention. For Medico-Legal purposes, it is also important to point out that variation in procedural techniques and pharmacological choices are the acceptable norm. The indications, contraindications, technique, and results of the above procedure should only be interpreted and judged by a Board-Certified Interventional Pain Specialist with extensive familiarity and expertise in the same exact procedure and technique.  Instructions provided at this appointment: Patient Instructions  ____________________________________________________________________________________________  Post-Procedure instructions Instructions:  Apply ice: Fill a plastic sandwich bag with crushed ice. Cover it with a small towel and apply to injection site. Apply for 15 minutes then remove x 15 minutes. Repeat sequence on day of procedure, until you go to bed. The purpose is to minimize swelling and discomfort after procedure.  Apply heat: Apply heat to procedure site starting the day following the procedure. The purpose is to treat any soreness and discomfort from the procedure.  Food intake: Start with clear liquids (like water) and advance to regular food, as tolerated.   Physical activities: Keep activities to a minimum for the first 8 hours after the procedure.   Driving: If you have received any sedation, you are not allowed to drive for 24 hours after your procedure.  Blood thinner: Restart your blood thinner 6 hours after your procedure. (Only for those taking blood thinners)  Insulin: As soon as you can eat, you may resume your normal dosing schedule. (Only for those taking insulin)  Infection prevention: Keep procedure site clean and  dry.  Post-procedure Pain Diary: Extremely important that this be done correctly and accurately. Recorded information will be used to determine the next step in treatment.  Pain evaluated is that of treated area only. Do not include pain from an untreated area.  Complete every hour, on the hour, for the initial 8 hours. Set an alarm to help you do this part accurately.  Do not go to sleep and have it completed later. It will not be accurate.  Follow-up appointment: Keep your  follow-up appointment after the procedure. Usually 2 weeks for most procedures. (6 weeks in the case of radiofrequency.) Bring you pain diary.  Expect:  From numbing medicine (AKA: Local Anesthetics): Numbness or decrease in pain.  Onset: Full effect within 15 minutes of injected.  Duration: It will depend on the type of local anesthetic used. On the average, 1 to 8 hours.   From steroids: Decrease in swelling or inflammation. Once inflammation is improved, relief of the pain will follow.  Onset of benefits: Depends on the amount of swelling present. The more swelling, the longer it will take for the benefits to be seen.   Duration: Steroids will stay in the system x 2 weeks. Duration of benefits will depend on multiple posibilities including persistent irritating factors.  From procedure: Some discomfort is to be expected once the numbing medicine wears off. This should be minimal if ice and heat are applied as instructed. Call if:  You experience numbness and weakness that gets worse with time, as opposed to wearing off.  New onset bowel or bladder incontinence. (Spinal procedures only)  Emergency Numbers:  Durning business hours (Monday - Thursday, 8:00 AM - 4:00 PM) (Friday, 9:00 AM - 12:00 Noon): [phone]  After hours: [phone] ____________________________________________________________________________________________  Pain Management Discharge Instructions  General Discharge  Instructions :  If you need to reach your doctor call: Monday-Friday 8:00 am - 4:00 pm at [phone] or toll free [phone].  After clinic hours [phone] to have operator reach doctor.  Bring all of your medication bottles to all your appointments in the pain clinic.  To cancel or reschedule your appointment with Pain Management please remember to call 24 hours in advance to avoid a fee.  Refer to the educational materials which you have been given on: General Risks, I had my Procedure. Discharge Instructions, Post Sedation.  Post Procedure Instructions:  The drugs you were given will stay in your system until tomorrow, so for the next 24 hours you should not drive, make any legal decisions or drink any alcoholic beverages.  You may eat anything you prefer, but it is better to start with liquids then soups and crackers, and gradually work up to solid foods.  Please notify your doctor immediately if you have any unusual bleeding, trouble breathing or pain that is not related to your normal pain.  Depending on the type of procedure that was done, some parts of your body may feel week and/or numb.  This usually clears up by tonight or the next day.  Walk with the use of an assistive device or accompanied by an adult for the 24 hours.  You may use ice on the affected area for the first 24 hours.  Put ice in a Ziploc bag and cover with a towel and place against area 15 minutes on 15 minutes off.  You may switch to heat after 24 hours.

## 2016-06-06 NOTE — Progress Notes (Signed)
Spoke to patient regarding no show and cancelled appointments. She received her discharge letter today form Dr. Dossie Arbour. Discussed this will be the last chance. It is very important to call ahead of time to cancel so other patients that are waiting can be moved to her spot. Verbalizes understanding that she will be discharged for the next no show.

## 2016-06-07 ENCOUNTER — Telehealth: Payer: Self-pay | Admitting: *Deleted

## 2016-06-07 NOTE — Telephone Encounter (Signed)
Patient verbalizes no questions or concerns from procedure.

## 2016-06-13 ENCOUNTER — Emergency Department
Admission: EM | Admit: 2016-06-13 | Discharge: 2016-06-13 | Disposition: A | Discharge status: Home / Self Care | Payer: Medicaid Other | Attending: Emergency Medicine | Admitting: Emergency Medicine

## 2016-06-13 ENCOUNTER — Encounter: Payer: Self-pay | Admitting: Emergency Medicine

## 2016-06-13 DIAGNOSIS — J449 Chronic obstructive pulmonary disease, unspecified: Secondary | ICD-10-CM | POA: Diagnosis not present

## 2016-06-13 DIAGNOSIS — E039 Hypothyroidism, unspecified: Secondary | ICD-10-CM | POA: Insufficient documentation

## 2016-06-13 DIAGNOSIS — R739 Hyperglycemia, unspecified: Secondary | ICD-10-CM

## 2016-06-13 DIAGNOSIS — I129 Hypertensive chronic kidney disease with stage 1 through stage 4 chronic kidney disease, or unspecified chronic kidney disease: Secondary | ICD-10-CM | POA: Insufficient documentation

## 2016-06-13 DIAGNOSIS — Z8673 Personal history of transient ischemic attack (TIA), and cerebral infarction without residual deficits: Secondary | ICD-10-CM | POA: Diagnosis not present

## 2016-06-13 DIAGNOSIS — N189 Chronic kidney disease, unspecified: Secondary | ICD-10-CM | POA: Diagnosis not present

## 2016-06-13 DIAGNOSIS — E1122 Type 2 diabetes mellitus with diabetic chronic kidney disease: Secondary | ICD-10-CM | POA: Diagnosis not present

## 2016-06-13 DIAGNOSIS — E1165 Type 2 diabetes mellitus with hyperglycemia: Secondary | ICD-10-CM | POA: Insufficient documentation

## 2016-06-13 DIAGNOSIS — Z79899 Other long term (current) drug therapy: Secondary | ICD-10-CM | POA: Insufficient documentation

## 2016-06-13 DIAGNOSIS — Z8542 Personal history of malignant neoplasm of other parts of uterus: Secondary | ICD-10-CM | POA: Insufficient documentation

## 2016-06-13 DIAGNOSIS — Z794 Long term (current) use of insulin: Secondary | ICD-10-CM | POA: Insufficient documentation

## 2016-06-13 DIAGNOSIS — Z791 Long term (current) use of non-steroidal anti-inflammatories (NSAID): Secondary | ICD-10-CM | POA: Insufficient documentation

## 2016-06-13 LAB — GLUCOSE, CAPILLARY
Glucose-Capillary: 427 mg/dL — ABNORMAL HIGH (ref 65–99)
Glucose-Capillary: 371 mg/dL — ABNORMAL HIGH (ref 65–99)
Glucose-Capillary: 326 mg/dL — ABNORMAL HIGH (ref 65–99)

## 2016-06-13 LAB — URINALYSIS, COMPLETE (UACMP) WITH MICROSCOPIC
Bilirubin Urine: NEGATIVE
Glucose, UA: >=500 mg/dL — AB
Hgb urine dipstick: NEGATIVE
Ketones, ur: NEGATIVE mg/dL
Leukocytes, UA: NEGATIVE
Nitrite: NEGATIVE
Protein, ur: NEGATIVE mg/dL
Specific Gravity, Urine: 1.03 (ref 1.005–1.030)
pH: 6 (ref 5.0–8.0)

## 2016-06-13 LAB — CBC
HCT: 46.4 % (ref 35.0–47.0)
Hemoglobin: 16.6 g/dL — ABNORMAL HIGH (ref 12.0–16.0)
MCH: 36.1 pg — ABNORMAL HIGH (ref 26.0–34.0)
MCHC: 35.7 g/dL (ref 32.0–36.0)
MCV: 101.1 fL — ABNORMAL HIGH (ref 80.0–100.0)
Platelets: 111 10*3/uL — ABNORMAL LOW (ref 150–440)
RBC: 4.59 MIL/uL (ref 3.80–5.20)
RDW: 13.4 % (ref 11.5–14.5)
WBC: 10.6 10*3/uL (ref 3.6–11.0)

## 2016-06-13 LAB — BASIC METABOLIC PANEL
Anion gap: 10 (ref 5–15)
BUN: 17 mg/dL (ref 6–20)
CO2: 26 mmol/L (ref 22–32)
Calcium: 9.5 mg/dL (ref 8.9–10.3)
Chloride: 98 mmol/L — ABNORMAL LOW (ref 101–111)
Creatinine, Ser: 0.85 mg/dL (ref 0.44–1.00)
GFR calc Af Amer: >60 mL/min (ref >60)
GFR calc non Af Amer: >60 mL/min (ref >60)
Glucose, Bld: 456 mg/dL — ABNORMAL HIGH (ref 65–99)
Potassium: 4.4 mmol/L (ref 3.5–5.1)
Sodium: 134 mmol/L — ABNORMAL LOW (ref 135–145)

## 2016-06-13 IMAGING — CT CT ABD-PELV W/ CM
1 of 3 series · 12 of 32 positions shown, 17 images · IV contrast (omnipaque)
Comparison: October 04, 2014

CLINICAL DATA: History of hepatic cirrhosis. One week history of
right-sided abdominal pain

EXAM:
CT ABDOMEN AND PELVIS WITH CONTRAST
TECHNIQUE: Multidetector CT imaging of the abdomen and pelvis was performed
using the standard protocol following bolus administration of
intravenous contrast. Oral contrast was also administered.
CONTRAST:  100mL OMNIPAQUE IOHEXOL 300 MG/ML  SOLN

[Series 2: routine abd pel with · axial · 0.89mm/px · z∈[-498,-44]mm · 12 of 103 slices shown, 17 images]
[im 6/103  soft-tissue]
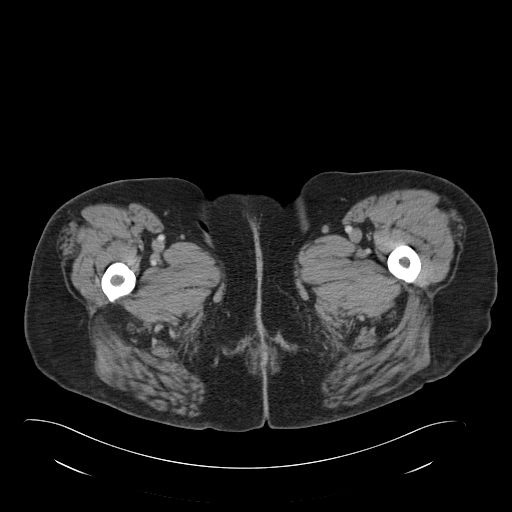
[im 6/103  bone]
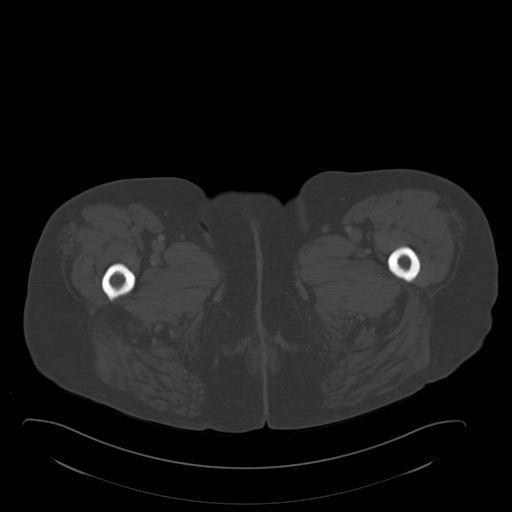
[im 18/103  soft-tissue]
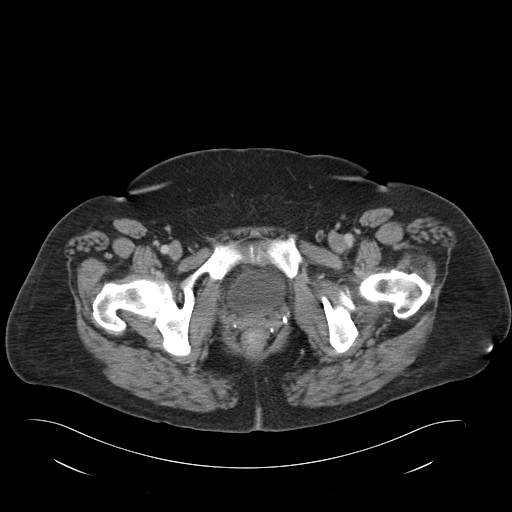
[im 23/103  soft-tissue]
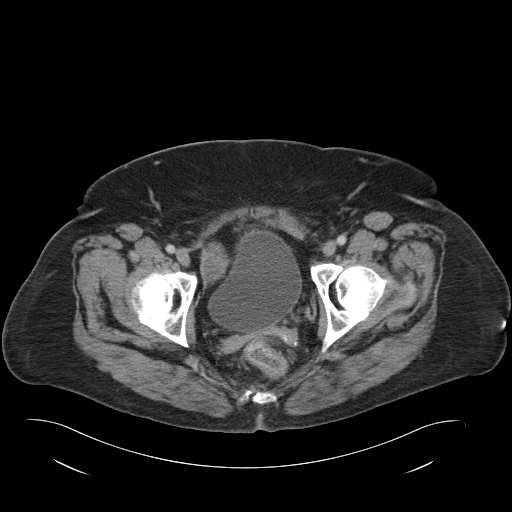
[im 35/103  soft-tissue]
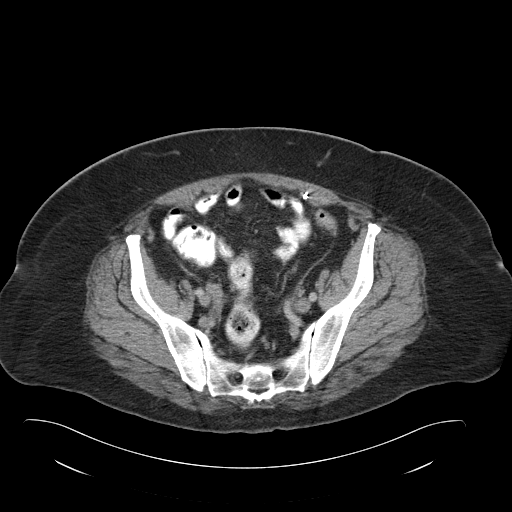
[im 40/103  soft-tissue]
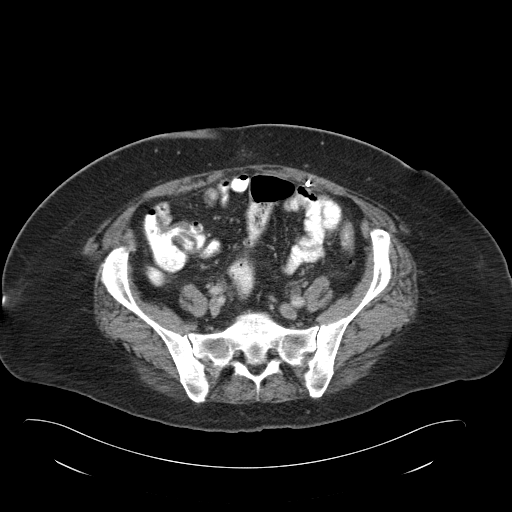
[im 52/103  soft-tissue]
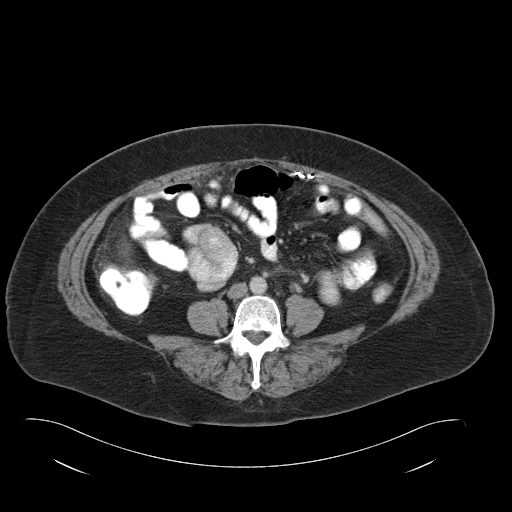
[im 63/103  soft-tissue]
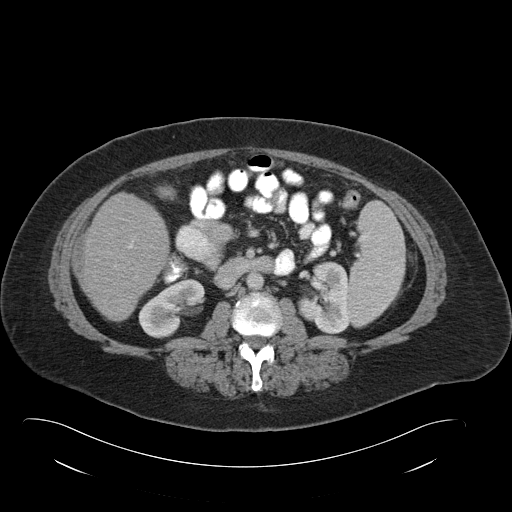
[im 69/103  soft-tissue]
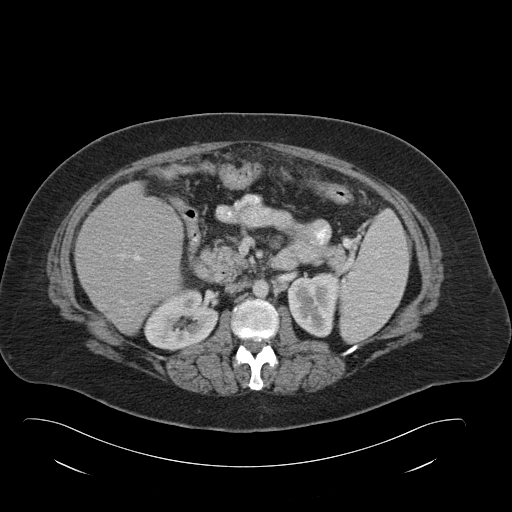
[im 80/103  soft-tissue]
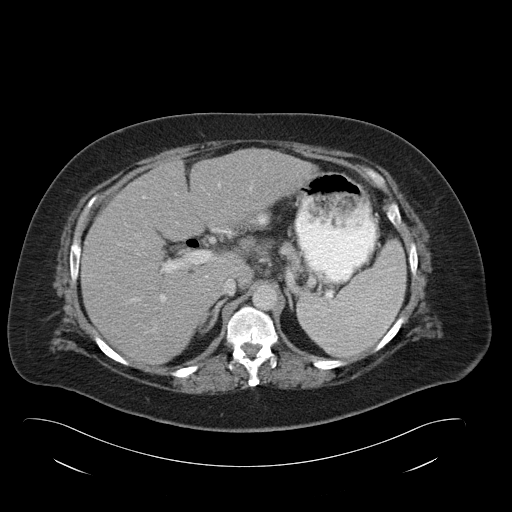
[im 80/103  lung]
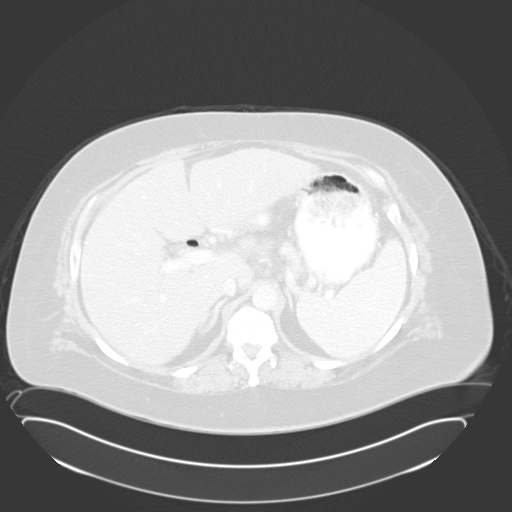
[im 80/103  bone]
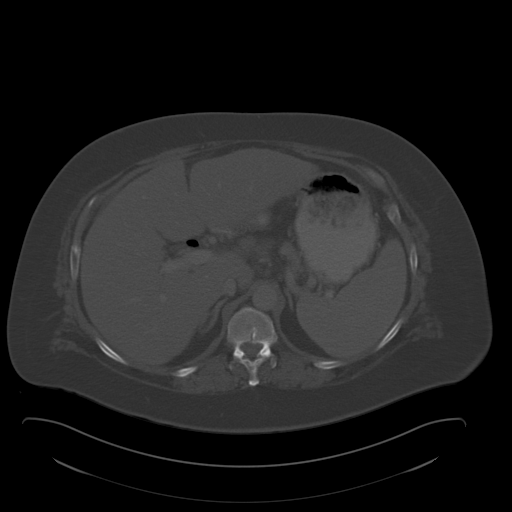
[im 86/103  soft-tissue]
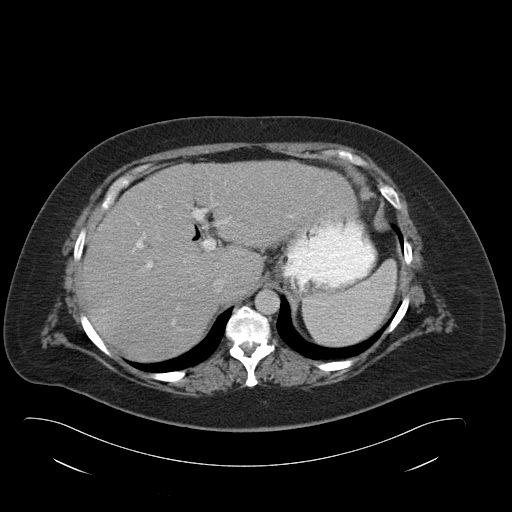
[im 86/103  lung]
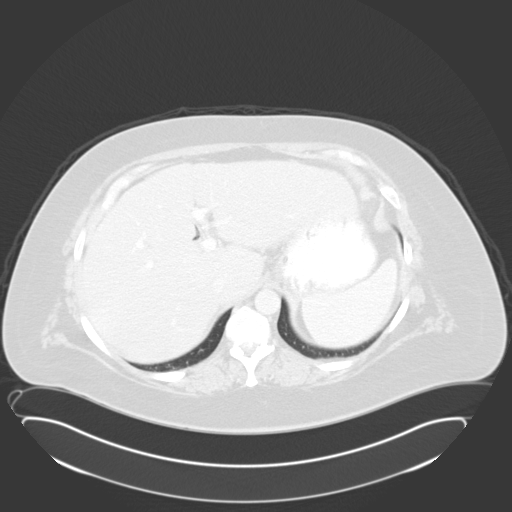
[im 91/103  lung]
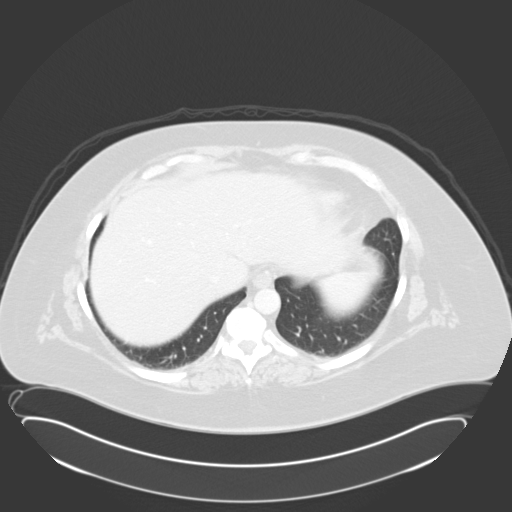
[im 97/103  soft-tissue]
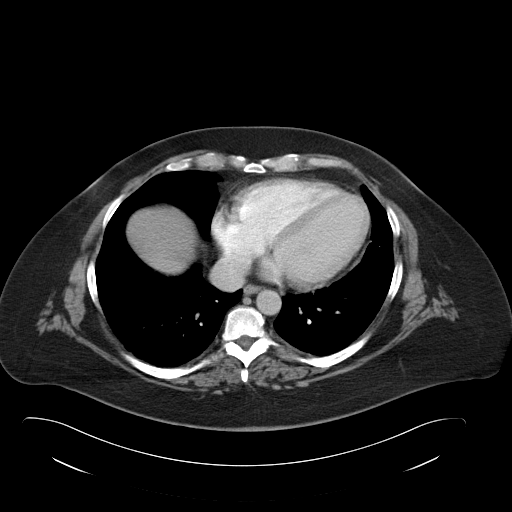
[im 97/103  lung]
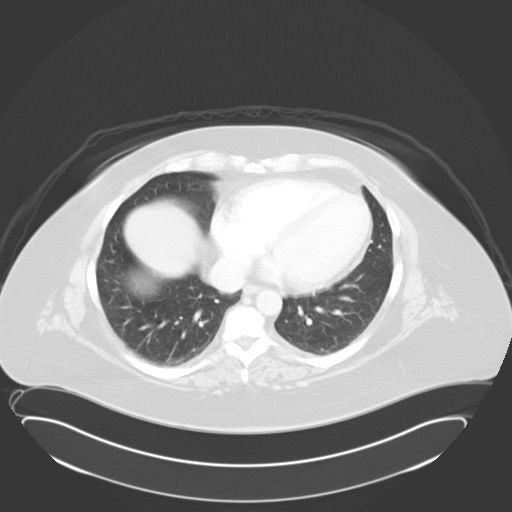

[12 of 32 positions shown; findings below may reference images not displayed]

FINDINGS: Lower chest:  Lung bases are clear.

Hepatobiliary: Liver is enlarged, measuring 23.3 cm in length. The
contour of the liver is somewhat nodular consistent with the known
cirrhosis. There is a stable 2 x 1.8 cm cyst the medial segment of
the left lobe of the liver near the fissure for the ligamentum
teres, stable. There is a 5 mm probable cyst in the lateral segment
of the left lobe of the liver. There are tiny presumed cysts in the
region of the dome of the liver on the right, state. No new liver
lesions are appreciable. Gallbladder is absent. There is air in the
biliary ductal system. There is no biliary duct dilatation. Portal
vein is patent in all segments.

Pancreas: There is no pancreatic mass or pancreatic inflammatory
focus.

Spleen: Spleen is enlarged, measuring 16.8 x 9.7 x 12.8 cm with a
measures splenic volume of 1,043 cubic cm. No focal splenic lesions
are identified.

Adrenals/Urinary Tract: Right adrenal appears normal. There is a
x 0.8 cm left adrenal adenoma, stable. Kidneys bilaterally show no
demonstrable mass or hydronephrosis on either side. There is no
renal or ureteral calculus on either side. The urinary bladder is
midline with wall thickness within normal limits.

Stomach/Bowel: There is no bowel wall thickening. There is mild
mesenteric thickening in the anterior abdomen near the transverse
colon, a finding that was also present on prior study. There is no
bowel obstruction. No free air or portal venous air.

Vascular/Lymphatic: There is no abdominal aortic aneurysm. No focal
vascular lesions are identified. There are a few small mesenteric
lymph nodes, likely due to the cirrhosis. By size criteria, there is
no demonstrable adenopathy in the abdomen or pelvis.

Reproductive: Uterus is absent. There is a small amount of free
fluid in the pelvis, likely residual ascites from prior study. No
pelvic masses evident.

Other: A small amount of ascites remains. There has been marked
diminution of ascites compared to prior study. There is no
demonstrable abscess. There is no periappendiceal region
inflammation. In the interval since the prior study, there has been
essentially complete resolution of a left rectus sheath hematoma.
Right anterior pelvic intramuscular hematoma has become considerably
smaller, currently measuring 4.5 x 2.5 cm compared to 10.7 x 4.5 cm
on the prior study. This hematoma causes modest impression on the
urinary bladder along its inferior aspect. No new intramuscular
hematomas are identified. Postoperative change in the left anterior
abdominal wall region is noted there appear to be endovascular coils
in this area specifically.

Musculoskeletal: There are no blastic or lytic bone lesions.
IMPRESSION: There remains evidence of hepatic cirrhosis with splenomegaly.

Gallbladder is absent. There is biliary ductal air. Biliary ductal
air may be seen as a consequence of previous sphincterotomy. If
patient has not had previous sphincterotomy, this finding is
concerning for potential dissection of air from perforated upper
gastrointestinal ulcer. The stomach and duodenum do not show wall
thickening on this study. No oral contrast extravasation is evident
on this study.

There has been marked interval resolution of ascites since prior
study. A small amount of residual ascites remains.

Edema/thickening in the mesentery of the anterior abdomen is likely
secondary to the cirrhosis. This is a stable finding compared to
prior study.

Significant resolution of right intramuscular pelvic hematoma.
Essentially complete resolution of prior left rectus hematoma.

Endovascular coils are present in the left abdominal wall region.

No abscess.  No bowel obstruction.

No renal or ureteral calculus.  No hydronephrosis.

Stable small left adrenal adenoma, a benign finding.

## 2016-06-13 MED ORDER — SODIUM CHLORIDE 0.9 % IV BOLUS (SEPSIS)
1000.0000 mL | Freq: Once | INTRAVENOUS | Status: AC
  Administered 2016-06-13: 1000 mL via INTRAVENOUS

## 2016-06-13 MED ORDER — ONDANSETRON HCL 4 MG/2ML IJ SOLN
4.0000 mg | Freq: Once | INTRAMUSCULAR | Status: AC
  Administered 2016-06-13: 4 mg via INTRAVENOUS

## 2016-06-13 MED ORDER — ONDANSETRON HCL 4 MG/2ML IJ SOLN
INTRAMUSCULAR | Status: AC
  Administered 2016-06-13: 4 mg via INTRAVENOUS
  Filled 2016-06-13: qty 2

## 2016-06-13 MED ORDER — SODIUM CHLORIDE 0.9 % IV BOLUS (SEPSIS)
1000.0000 mL | INTRAVENOUS | Status: AC
  Administered 2016-06-13: 1000 mL via INTRAVENOUS

## 2016-06-13 NOTE — ED Triage Notes (Signed)
Pt reports elevated blood sugar today. Pt states was over 400. Pt reports taking levimir 40 U, novolog 44U today.

## 2016-06-13 NOTE — Discharge Instructions (Signed)
As we discussed, though your blood sugar is running high, it is not dangerous at this time.  Making adjustments in the Emergency Department (ED) and possibly causing your glucose level to drop too low is more dangerous than continuing your current medications at this time until you can follow up with your clinic doctor.  Please continue your medications and follow up with your regular doctor as recommended in these documents.  If you develop new or worsening symptoms that concern you, please return to the Emergency Department.

## 2016-06-13 NOTE — ED Provider Notes (Signed)
[facility] Emergency Department Provider Note  ____________________________________________   First MD Initiated Contact with Patient 06/13/16 1901     (approximate)  I have reviewed the triage vital signs and the nursing notes.   HISTORY  Chief Complaint Hyperglycemia    HPI Jenevie Casstevens is a 51 y.o. female with extensive PMH including chronic back pain for which she recently had an injection.  She presents today for evaluation of high blood sugar.  She reports that she is compliant with her medications including her insulin earlier today.  She reports that she is having some nausea but no vomiting.  Her pain is chronic and no worse than usual.  She denies chest pain, shortness of breath, fever/chills, dysuria.   Nothing in particular makes the patient's symptoms better nor worse.  She reports that her blood sugar has not been below 200 and years and that she has a new endocrinologist as a week ago.  She is working on dietary changes to help control her blood sugar.  She reports that her symptoms are severe.  Nothing in particular makes the patient's symptoms better nor worse.     Past Medical History:  Diagnosis Date  . Abdominal abscess 08/25/2014  . Acid reflux 08/10/2010  . Acute cervical myofascial strain 02/09/2016  . Anxiety   . Ascites   . Asthma   . Back pain   . Bile leak, postoperative 07/17/2014  . Brittle bone disease   . Cancer ([place])    Uteriine  ca 60yr ago partial hysterectomy  . Cervical disc disease   . Chronic kidney disease   . Collagen vascular disease ([place]    RA  3-4 yrs ago  . COPD (chronic obstructive pulmonary disease) ([place]   . Diabetes mellitus without complication ([place]   . GERD (gastroesophageal reflux disease)   . Hypertension   . Hypothyroidism   . Left upper quadrant pain 01/09/2014  . Major depressive disorder with single episode 12/05/2011  . Major depressive disorder, single episode 12/05/2011  . Migraines   . NASH  (nonalcoholic steatohepatitis)   . Respiratory infection    2/17  . Shock ([place] 09/18/2014  . Sleep apnea   . Sleep apnea   . Syncope 11/16/2014  . Thyroid disease   . TIA (transient ischemic attack)     Patient Active Problem List   Diagnosis Date Noted  . Nausea without vomiting 06/06/2016  . Chronic pain of lower extremity, bilateral 05/19/2016  . Spinal stenosis, thoracic region (T10-11) 05/19/2016  . Diabetes mellitus, insulin dependent (IDDM), uncontrolled ([place] 04/12/2016  . Chronic sacroiliac joint pain (Bilateral) (L>R) 03/23/2016  . Chronic pain of left upper extremity 03/10/2016  . Chronic radicular cervical pain (L) 03/10/2016  . Osteoarthritis 02/24/2016  . Allodynia 02/09/2016  . Chronic pain syndrome 01/07/2016  . Opiate withdrawal ([place] 12/22/2015  . Elevated liver enzymes 09/28/2015  . Cirrhosis of liver without ascites ([place] 09/24/2015  . Altered mental status 07/24/2015  . Radicular pain of thoracic region 04/06/2015  . Hepatic encephalopathy ([place] 03/11/2015  . Elevated sedimentation rate 03/05/2015  . Elevated C-reactive protein (CRP) 03/05/2015  . Lumbar facet syndrome (Location of Primary Source of Pain) (Bilateral) (R>L) 03/05/2015  . Cervical spondylosis 03/05/2015  . Chronic feet pain (Location of Secondary source of pain) (Bilateral) (R>L) 03/05/2015  . Lumbar spondylosis 03/05/2015  . Encounter for chronic pain management 03/05/2015  . Chronic shoulder pain (Bilateral) (R>L) 03/05/2015  . Chronic carpal tunnel syndrome (Bilateral) 03/05/2015  .  Chronic hip pain (Bilateral) (L>R) 03/05/2015  . Chronic upper back pain (Bilateral) (L>R) 03/05/2015  . Osteoporosis, idiopathic 03/05/2015  . Abnormal MRI, lumbar spine (02/03/2015) 03/05/2015  . Thoracic radiculitis 02/05/2015  . Vitamin D deficiency 01/13/2015  . B12 deficiency 01/13/2015  . Folate deficiency 01/13/2015  . Subacute lumbar radiculopathy (left side) (S1 dermatome) 12/10/2014  .  Drowsiness 11/16/2014  . Episode of syncope 11/16/2014  . Somnolence 11/16/2014  . Opiate use (75 MME/Day) 10/28/2014  . Long term prescription opiate use 10/28/2014  . Long term current use of opiate analgesic 10/28/2014  . Encounter for therapeutic drug level monitoring 10/28/2014  . Chronic epigastric abdominal pain 10/28/2014  . Chronic low back pain (Location of Primary Source of Pain) (Bilateral) (R>L) 10/28/2014  . Chronic neck pain (Location of Tertiary source of pain) (Bilateral) (R>L) 10/28/2014  . Ascites 09/05/2014  . NASH (nonalcoholic steatohepatitis) 09/05/2014  . Hypokalemia 09/05/2014  . Dysthymia 08/05/2014  . Other social stressor 08/05/2014  . Steatohepatitis 07/15/2014  . Type 2 diabetes mellitus ([place]) 07/12/2014  . COPD (chronic obstructive pulmonary disease) ([place]) 07/12/2014  . GERD (gastroesophageal reflux disease) 07/12/2014  . OSA on CPAP 07/12/2014  . Anxiety 07/12/2014  . 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 Lumbar and sacral osteoarthritis 03/26/2014  . Myofascial pain 03/26/2014  . Lumbar spinal stenosis 03/26/2014  . Lumbosacral spondylosis without myelopathy 03/26/2014  . 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 Spinal stenosis, lumbar region without neurogenic claudication (L1-2 and L4-5) 03/26/2014  . Spondylosis of lumbar region without myelopathy or radiculopathy 03/26/2014  . Breath shortness 01/09/2014  . Diastolic dysfunction 18/56/3149  . Airway hyperreactivity 08/06/2013  . Essential (primary) hypertension 08/06/2013  . Asthma 08/06/2013  . Clinical depression 12/05/2011    Past Surgical History:  Procedure Laterality Date  . ABDOMINAL HYSTERECTOMY    . CHOLECYSTECTOMY N/A 07/15/2014   Procedure: LAPAROSCOPIC CHOLECYSTECTOMY with liver biopsy ;  Surgeon: Sherri Rad, MD;  Location: [facility] ORS;  Service: General;  Laterality: N/A;  . COLONOSCOPY WITH PROPOFOL N/A 06/23/2014   Procedure: COLONOSCOPY WITH PROPOFOL;  Surgeon:  Lollie Sails, MD;  Location: [facility] ENDOSCOPY;  Service: Endoscopy;  Laterality: N/A;  . ERCP N/A 07/16/2014   Procedure: ENDOSCOPIC RETROGRADE CHOLANGIOPANCREATOGRAPHY (ERCP);  Surgeon: Clarene Essex, MD;  Location: [facility] ENDOSCOPY;  Service: Endoscopy;  Laterality: N/A;  . ERCP N/A 10/03/2014   Procedure: ENDOSCOPIC RETROGRADE CHOLANGIOPANCREATOGRAPHY (ERCP);  Surgeon: Hulen Luster, MD;  Location: [facility] ENDOSCOPY;  Service: Gastroenterology;  Laterality: N/A;  . ESOPHAGOGASTRODUODENOSCOPY N/A 06/23/2014   Procedure: ESOPHAGOGASTRODUODENOSCOPY (EGD);  Surgeon: Lollie Sails, MD;  [facility] ENDOSCOPY;  Service: Endoscopy;  Laterality: N/A;  . ESOPHAGOGASTRODUODENOSCOPY (EGD) WITH PROPOFOL N/A 12/01/2015   Procedure: ESOPHAGOGASTRODUODENOSCOPY (EGD) WITH PROPOFOL;  Surgeon: Lollie Sails, MD;  Location: [facility] ENDOSCOPY;  Service: Endoscopy;  Laterality: N/A;  . TUBAL LIGATION    . WISDOM TOOTH EXTRACTION      Prior to Admission medications   Medication Sig Start Date End Date Taking? Authorizing Provider  albuterol (ACCUNEB) 0.63 MG/3ML nebulizer solution Inhale 1 ampule by nebulization every six (6) hours as needed for wheezing.    [provider]  ALPRAZolam Duanne Moron) 1 MG tablet Take 1-2 mg by mouth 3 (three) times daily as needed for anxiety.    [provider]  Cholecalciferol (VITAMIN D-1000 MAX ST) 1000 units tablet Take 2,000 Units by mouth daily.     [provider]  citalopram (CELEXA) 20 MG tablet Take 20 mg  by mouth daily.  07/24/15   [provider]  diclofenac sodium (VOLTAREN) 1 % GEL Apply 4 g topically 4 (four) times daily. 04/12/16 10/09/16  Milinda Pointer, MD  dicyclomine (BENTYL) 10 MG capsule Take 10 mg by mouth 4 (four) times daily -  before meals and at bedtime.     [provider]  fluticasone (FLONASE) 50 MCG/ACT nasal spray Place 1-2 sprays into both nostrils daily as needed for rhinitis.     [provider]    Folate-B12-Intrinsic Factor (INTRINSI B12-FOLATE) [phone] MCG-MCG-MG TABS Take 1 tablet by mouth daily. Reported on 04/06/2015    [provider]  furosemide (LASIX) 40 MG tablet Take 80 mg by mouth as needed.     [provider]  gabapentin (NEURONTIN) 300 MG capsule Take 900 mg by mouth 4 (four) times daily as needed (for pain).     [provider]  insulin aspart (NOVOLOG) 100 UNIT/ML injection 18 units with meals 3 times daily plus sliding scale.  MDD 80 units. 06/03/16   [provider]  Insulin Detemir (LEVEMIR FLEXPEN [address]) Inject 30 Units into the skin 2 (two) times daily.     [provider]  ipratropium (ATROVENT) 0.02 % nebulizer solution Inhale 500 mcg by nebulization Four (4) times a day.    [provider]  lactulose (CHRONULAC) 10 GM/15ML solution Take 30 g by mouth 3 (three) times daily as needed.  03/13/15   [provider]  naloxone Karma Greaser) 2 MG/2ML injection Inject into the vein as needed.    [provider]  nystatin (MYCOSTATIN) powder Apply 1 g topically 3 (three) times daily as needed (for irritation).     [provider]  pantoprazole (PROTONIX) 40 MG tablet Take 40 mg by mouth daily.  08/31/15   [provider]  potassium chloride SA (K-DUR,KLOR-CON) 20 MEQ tablet Take 20 mEq by mouth as needed.    [provider]  promethazine (PHENERGAN) 12.5 MG tablet Take 12.5 mg by mouth every 6 (six) hours as needed for nausea or vomiting.     [provider]  rifaximin (XIFAXAN) 550 MG TABS tablet Take 550 mg by mouth 2 (two) times daily.     [provider]  rizatriptan (MAXALT) 10 MG tablet Take 10 mg by mouth as needed for migraine.     [provider]  spironolactone (ALDACTONE) 100 MG tablet Take 100 mg by mouth daily.     [provider]  vitamin B-12 (CYANOCOBALAMIN) 1000 MCG tablet Take 2,000 mcg by mouth daily.    [provider]     Allergies Tape and Vicodin [hydrocodone-acetaminophen]  Family History  Problem Relation Age of Onset  . Lung cancer Father   . Ulcers Father   . Heart disease Sister   . Ulcers Sister   . Heart disease Brother     Social History Social History  Substance Use Topics  . Smoking status: Never Smoker  . Smokeless tobacco: Never Used  . Alcohol use No     Comment: occ    Review of Systems Constitutional: No fever/chills.  Elevated blood sugar. Eyes: No visual changes. ENT: No sore throat. Cardiovascular: Denies chest pain. Respiratory: Denies shortness of breath. Gastrointestinal: No abdominal pain.  No nausea, no vomiting.  No diarrhea.  No constipation. Genitourinary: Negative for dysuria. Musculoskeletal: Negative for neck pain.  Negative for back pain. Integumentary: Negative for rash. Neurological: Negative for headaches, focal weakness or numbness.   ____________________________________________  PHYSICAL EXAM:  VITAL SIGNS: ED Triage Vitals  Enc Vitals Group     BP 06/13/16 1645 122/79     Pulse Rate 06/13/16 1645 (!) 111     Resp 06/13/16 1645 20     Temp 06/13/16 1645 99.6 F (37.6 C)     Temp Source 06/13/16 1645 Oral     SpO2 06/13/16 1645 92 %     Weight 06/13/16 1642 113.9 kg (251 lb)     Height 06/13/16 1642 1.575 m (5' 2" )     Head Circumference --      Peak Flow --      Pain Score 06/13/16 1642 8     Pain Loc --      Pain Edu? --      Excl. in [place]? --     Constitutional: Alert and oriented. Well appearing and in no acute distress. Eyes: Conjunctivae are normal.  Head: Atraumatic. Nose: No congestion/rhinnorhea. Mouth/Throat: Mucous membranes are moist. Neck: No stridor.  No meningeal signs.   Cardiovascular: Normal rate, regular rhythm. Good peripheral circulation. Grossly normal heart sounds. Respiratory: Normal respiratory effort.  No retractions. Lungs CTAB. Gastrointestinal: Obese. Soft and nontender. No distention.   Musculoskeletal: No lower extremity tenderness nor edema. No gross deformities of extremities. Neurologic:  Normal speech and language. No gross focal neurologic deficits are appreciated.  Skin:  Skin is warm, dry and intact. No rash noted. Psychiatric: Mood and affect are normal. Speech and behavior are normal.  ____________________________________________   LABS (all labs ordered are listed, but only abnormal results are displayed)  Labs Reviewed  BASIC METABOLIC PANEL - Abnormal; Notable for the following:       Result Value   Sodium 134 (*)    Chloride 98 (*)    Glucose, Bld 456 (*)    All other components within normal limits  CBC - Abnormal; Notable for the following:    Hemoglobin 16.6 (*)    MCV 101.1 (*)    MCH 36.1 (*)    Platelets 111 (*)    All other components within normal limits  URINALYSIS, COMPLETE (UACMP) WITH MICROSCOPIC - Abnormal; Notable for the following:    Color, Urine YELLOW (*)    APPearance CLEAR (*)    Glucose, UA >=500 (*)    Bacteria, UA RARE (*)    Squamous Epithelial / LPF 0-5 (*)    All other components within normal limits  GLUCOSE, CAPILLARY - Abnormal; Notable for the following:    Glucose-Capillary 427 (*)    All other components within normal limits  GLUCOSE, CAPILLARY - Abnormal; Notable for the following:    Glucose-Capillary 371 (*)    All other components within normal limits  CBG MONITORING, ED  CBG MONITORING, ED  CBG MONITORING, ED   ____________________________________________  EKG  None - EKG not ordered by ED physician ____________________________________________  RADIOLOGY   No results found.  ____________________________________________   PROCEDURES  Critical Care performed: No   Procedure(s) performed:   Procedures   ____________________________________________   INITIAL IMPRESSION / ASSESSMENT AND PLAN / ED COURSE  Pertinent labs & imaging results that were available during my care of the  patient were reviewed by me and considered in my medical decision making (see chart for details).  The patient's workup is reassuring.  She had some tachycardia when she arrived but that resolved.  Her initial blood sugar was elevated but with a normal anion gap and no significant electrolyte abnormalities.  After 1 L  of fluid her blood sugar dropped to 371 by fingerstick.  She has no evidence of any acute infection.  I had my usual and customary asymptomatic hyperglycemia and discussion with the patient and will provide a prescription for anti-emetics but encouraged her to follow up as an outpatient with her primary care doctor.  She is finishing up a second liter of fluid and she will follow up as an outpatient.  She understands and agrees with the plan.   Clinical Course as of Jun 13 2029  Mon Jun 13, 2016  1903 Hemoglobin: (!) 16.6 [RS]    Clinical Course User Index [RS] Rosealee Albee, Student-PA    ____________________________________________  FINAL CLINICAL IMPRESSION(S) / ED DIAGNOSES  Final diagnoses:  Hyperglycemia     MEDICATIONS GIVEN DURING THIS VISIT:  Medications  sodium chloride 0.9 % bolus 1,000 mL (0 mLs Intravenous Stopped 06/13/16 1859)  ondansetron (ZOFRAN) injection 4 mg (4 mg Intravenous Given 06/13/16 1816)  sodium chloride 0.9 % bolus 1,000 mL (0 mLs Intravenous Stopped 06/13/16 2021)     NEW OUTPATIENT MEDICATIONS STARTED DURING THIS VISIT:  New Prescriptions   No medications on file    Modified Medications   No medications on file    Discontinued Medications   No medications on file     Note:  This document was prepared using Dragon voice recognition software and may include unintentional dictation errors.    Hinda Kehr, MD 06/13/16 2031

## 2016-06-18 IMAGING — MR MR LUMBAR SPINE W/O CM
5 series · 35 of 48 positions shown · non-contrast
Comparison: CT Abdomen and Pelvis 01/29/2015 and earlier

CLINICAL DATA: 49-year-old female with right side lumbar back pain
for 2 weeks, progressively increasing. Left S1 radicular pain.
Initial encounter. Remote fall in [id number].

EXAM:
MRI LUMBAR SPINE WITHOUT CONTRAST
TECHNIQUE: Multiplanar, multisequence MR imaging of the lumbar spine was
performed. No intravenous contrast was administered.

[Series 2: T2 · sagittal · 4.0mm · 0.81mm/px · 6 of 15 slices shown (1 of 2)]
[im 1/15]
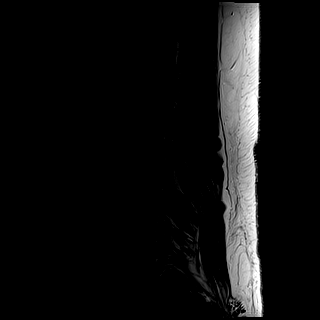
[im 3/15]
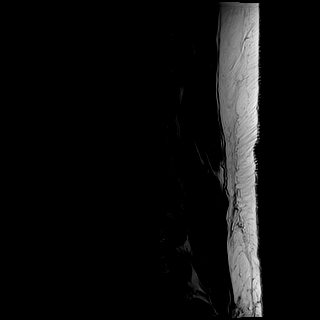
[im 6/15]
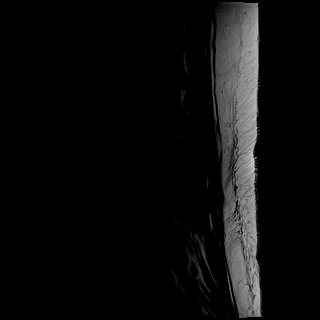
[im 9/15]
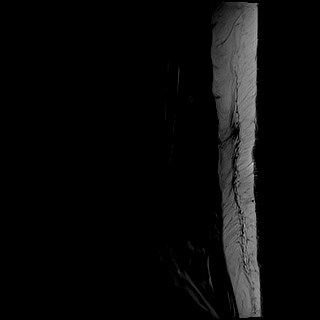
[im 12/15]
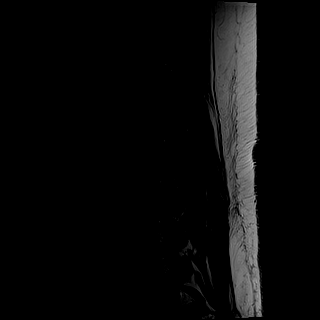
[im 15/15]
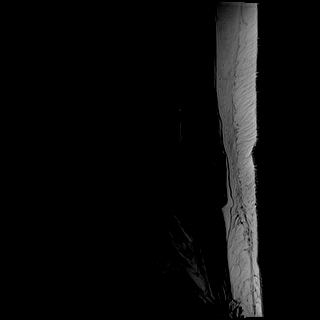

[Series 3: T1 · sagittal · 4.0mm · 0.81mm/px · 6 of 15 slices shown (1 of 2)]
[im 1/15]
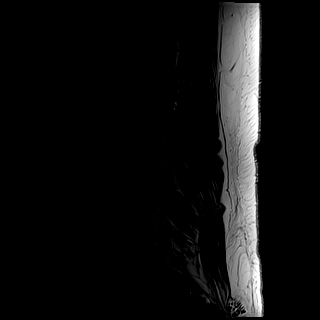
[im 3/15]
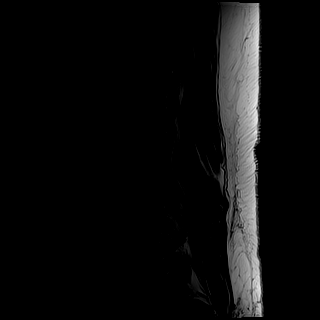
[im 6/15]
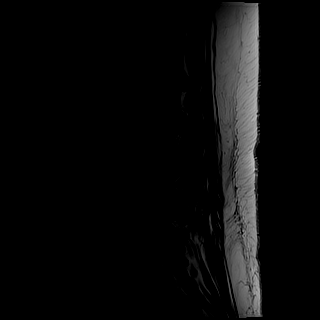
[im 9/15]
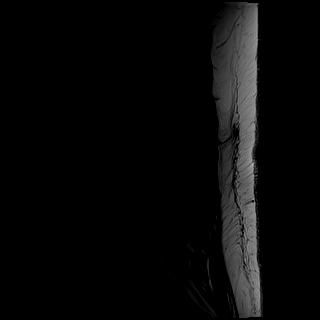
[im 12/15]
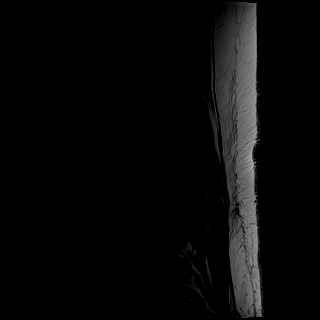
[im 15/15]
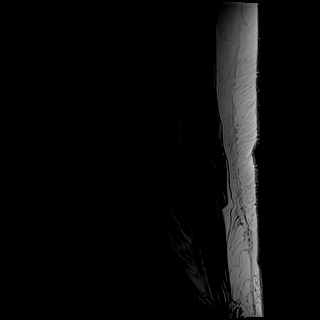

[Series 4: STIR · sagittal · 4.0mm · 1.02mm/px · 5 of 15 slices shown]
[im 1/15]
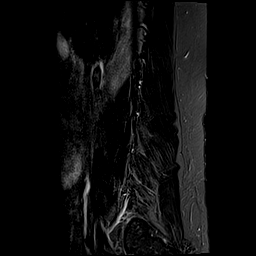
[im 3/15]
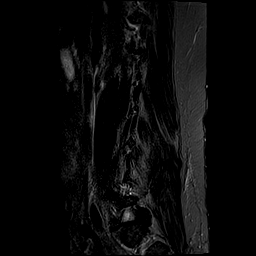
[im 6/15]
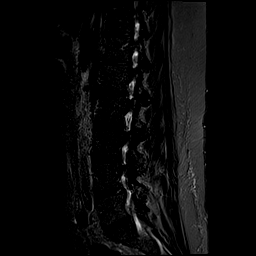
[im 9/15]
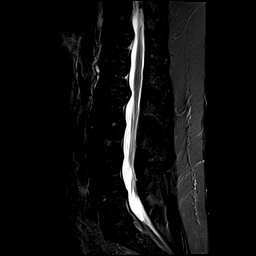
[im 12/15]
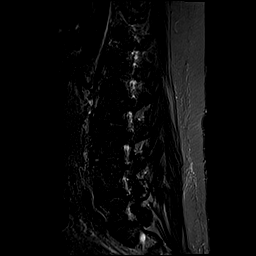

[Series 5: T2 · axial · 4.0mm · 0.78mm/px · z∈[-9,+188]mm · 9 of 38 slices shown (2 of 2)]
[im 1/38]
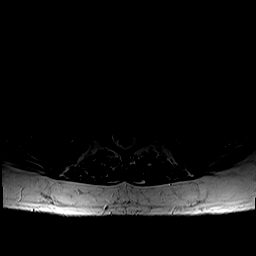
[im 6/38]
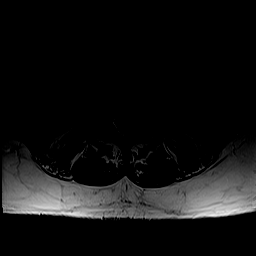
[im 11/38]
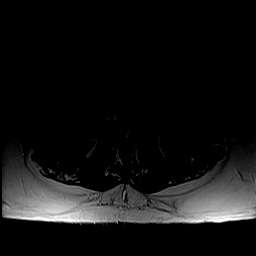
[im 16/38]
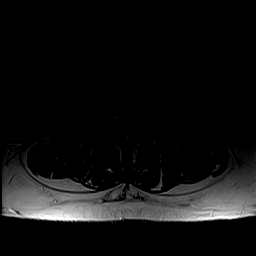
[im 19/38]
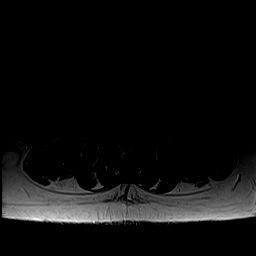
[im 22/38]
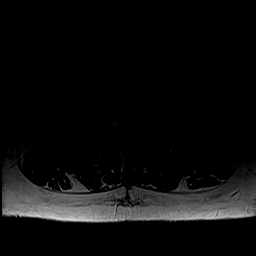
[im 27/38]
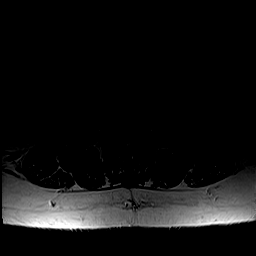
[im 32/38]
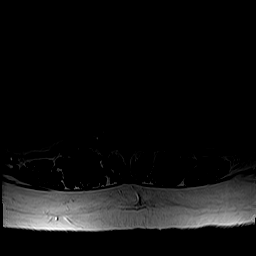
[im 38/38]
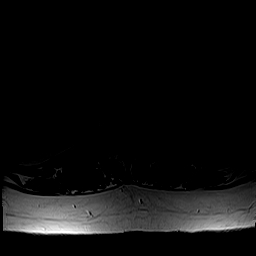

[Series 6: T1 · axial · 4.0mm · 0.39mm/px · z∈[-9,+188]mm · 9 of 38 slices shown (2 of 2)]
[im 1/38]
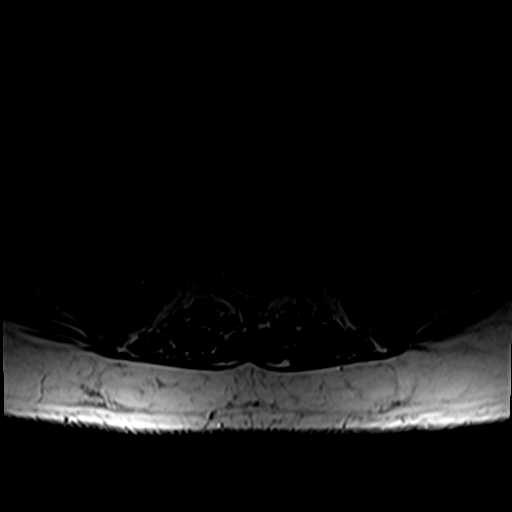
[im 6/38]
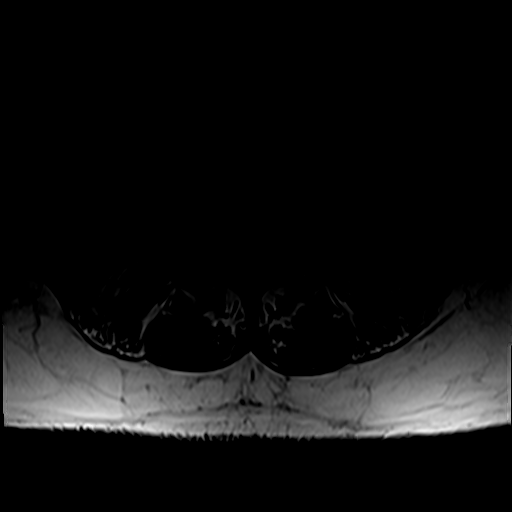
[im 11/38]
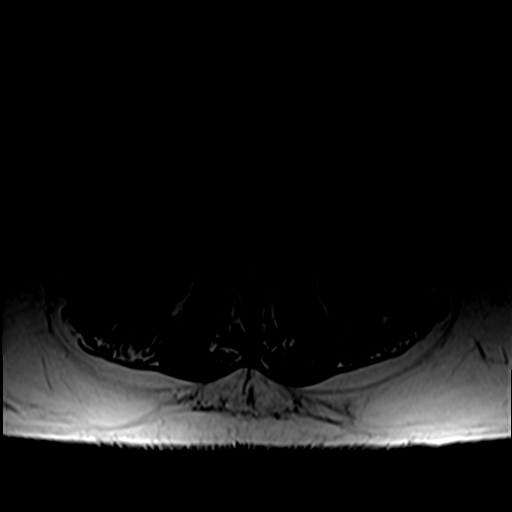
[im 16/38]
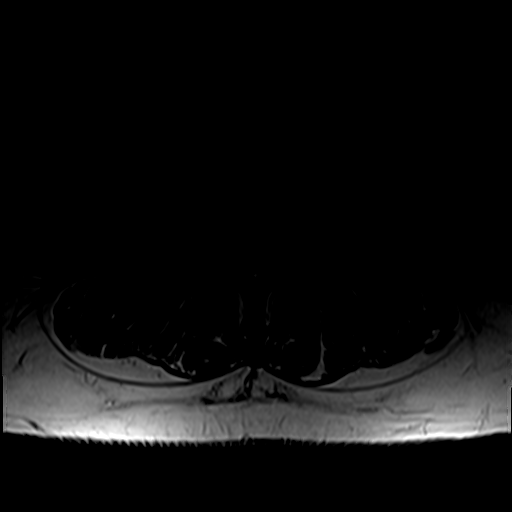
[im 19/38]
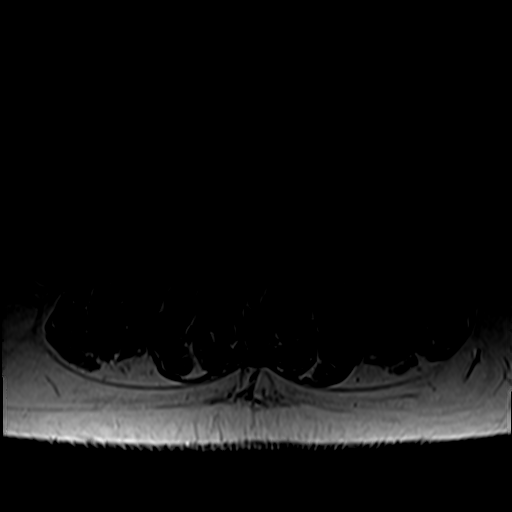
[im 22/38]
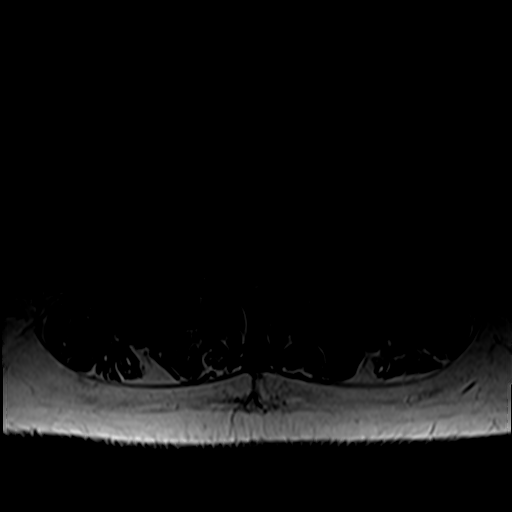
[im 27/38]
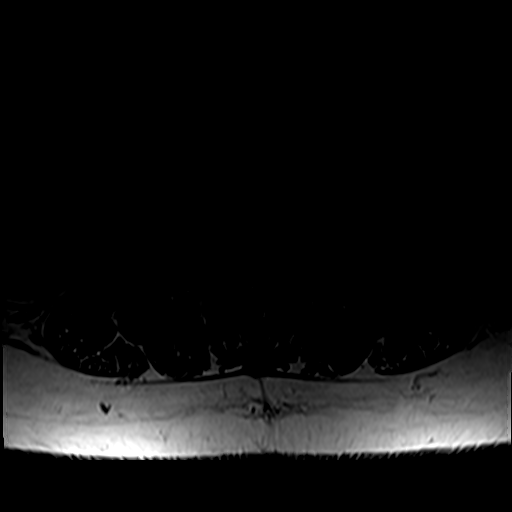
[im 32/38]
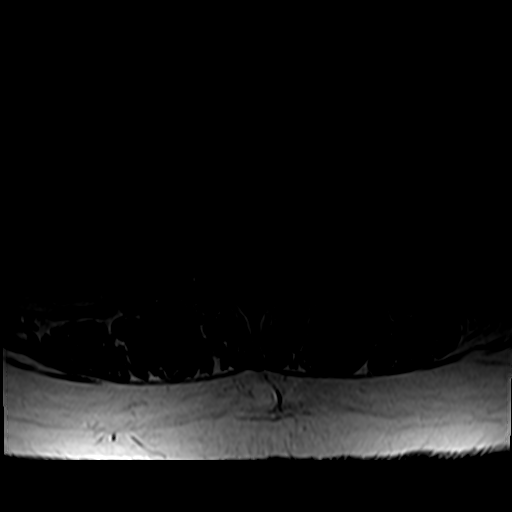
[im 38/38]
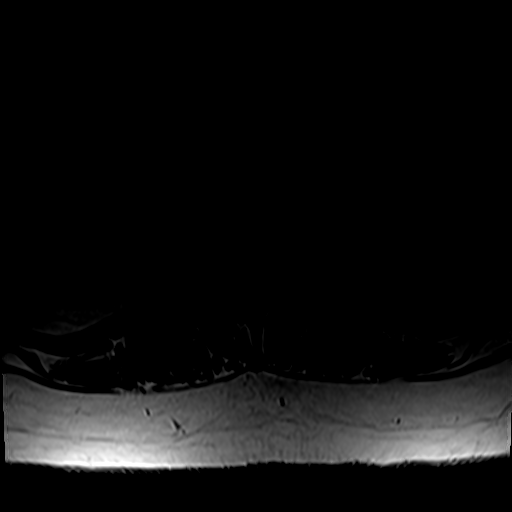

[35 of 48 positions shown; findings below may reference images not displayed]

FINDINGS: Normal lumbar segmentation demonstrated on the comparison.
Straightening of lumbar lordosis, otherwise normal vertebral height
and alignment. Bone marrow signal within normal limits. No marrow
edema or evidence of acute osseous abnormality. Visible sacrum
intact.

No signal abnormality in the visualized lower thoracic spinal cord.
Conus medullaris appears normal at L1-L2.

Decreased T2 signal in the visible liver and spleen. Stable
visualized abdominal viscera, small retroperitoneal lymph nodes.
Negative visualized posterior paraspinal soft tissues.

T10-T11: Bulky but incompletely visualized central disc extrusion
suggested at T10-T11 (series 2, image 8). This disc herniation is
seen to be partially calcified on the recent CT Abdomen and Pelvis
comparison. Those images suggest moderate associated lower thoracic
spinal stenosis.

T11-T12: Small central disc protrusion. Mild to moderate left facet
hypertrophy. Moderate left T11 foraminal stenosis.

T12-L1: Right paracentral disc protrusion with no significant
stenosis.

L1-L2: Left paracentral and caudal disc extrusion (series 5, image
10) is mild. Borderline to mild spinal stenosis at this level
without foraminal involvement.

L2-L3:  Minimal disc bulge.  Mild facet hypertrophy.  No stenosis.

L3-L4:  Mild disc bulge.  Moderate facet hypertrophy.  No stenosis.

L4-L5: Mild circumferential disc bulge. Moderate to severe facet
hypertrophy. Borderline to mild spinal stenosis (series 5, image
29). No convincing lateral recess or foraminal stenosis.

L5-S1:  Mild disc bulge and facet hypertrophy.  No stenosis.
IMPRESSION: 1. Disc degeneration maximal in the lower thoracic and upper lumbar
spine. Suspect up to moderate lower thoracic spinal stenosis at
T10-T11 related to a partially calcified disc herniation which is
partially visible on this study. Follow-up thoracic spine MRI would
evaluate further as necessary.
2. Mild multifactorial lumbar spinal stenosis at L1-L2 and L4-L5.
3. Moderate left T11 neural foraminal stenosis. No lumbar foraminal
stenosis.
4.  No acute osseous abnormality in the lumbar spine.

## 2016-06-20 DIAGNOSIS — K5900 Constipation, unspecified: Secondary | ICD-10-CM | POA: Insufficient documentation

## 2016-06-20 DIAGNOSIS — R739 Hyperglycemia, unspecified: Secondary | ICD-10-CM | POA: Insufficient documentation

## 2016-06-21 ENCOUNTER — Telehealth: Payer: Self-pay | Admitting: Pain Medicine

## 2016-06-21 NOTE — Telephone Encounter (Signed)
Patient wants to know when she could have another procedure. She is scheduled for follow up on 07-04-16. She was given 30 days to find another phys starting May 31. Please ask Dr. Dossie Arbour if he is willing to do another procedure before her final discharge and let patient know.

## 2016-06-21 NOTE — Telephone Encounter (Signed)
Talked with Dr Dossie Arbour and he states she needs to come in for eval. Concerned about BS issues

## 2016-06-22 NOTE — Telephone Encounter (Signed)
Dr Dossie Arbour wants to eval pt and can see today

## 2016-06-23 ENCOUNTER — Ambulatory Visit: Payer: Medicaid Other | Attending: Pain Medicine | Admitting: Nurse Practitioner

## 2016-06-23 ENCOUNTER — Telehealth: Payer: Self-pay

## 2016-06-23 ENCOUNTER — Encounter: Payer: Self-pay | Admitting: Nurse Practitioner

## 2016-06-23 VITALS — BP 128/74 | HR 97 | Temp 97.9°F | Resp 20 | Ht 62.0 in | Wt 260.0 lb

## 2016-06-23 DIAGNOSIS — G545 Neuralgic amyotrophy: Secondary | ICD-10-CM | POA: Diagnosis not present

## 2016-06-23 DIAGNOSIS — M4696 Unspecified inflammatory spondylopathy, lumbar region: Secondary | ICD-10-CM | POA: Diagnosis not present

## 2016-06-23 DIAGNOSIS — Z7982 Long term (current) use of aspirin: Secondary | ICD-10-CM | POA: Insufficient documentation

## 2016-06-23 DIAGNOSIS — E1165 Type 2 diabetes mellitus with hyperglycemia: Secondary | ICD-10-CM | POA: Diagnosis not present

## 2016-06-23 DIAGNOSIS — G8929 Other chronic pain: Secondary | ICD-10-CM | POA: Insufficient documentation

## 2016-06-23 DIAGNOSIS — K7469 Other cirrhosis of liver: Secondary | ICD-10-CM | POA: Diagnosis not present

## 2016-06-23 DIAGNOSIS — J449 Chronic obstructive pulmonary disease, unspecified: Secondary | ICD-10-CM | POA: Diagnosis not present

## 2016-06-23 DIAGNOSIS — F341 Dysthymic disorder: Secondary | ICD-10-CM | POA: Insufficient documentation

## 2016-06-23 DIAGNOSIS — G4733 Obstructive sleep apnea (adult) (pediatric): Secondary | ICD-10-CM | POA: Diagnosis not present

## 2016-06-23 DIAGNOSIS — M533 Sacrococcygeal disorders, not elsewhere classified: Secondary | ICD-10-CM | POA: Insufficient documentation

## 2016-06-23 DIAGNOSIS — M4722 Other spondylosis with radiculopathy, cervical region: Secondary | ICD-10-CM | POA: Insufficient documentation

## 2016-06-23 DIAGNOSIS — R7982 Elevated C-reactive protein (CRP): Secondary | ICD-10-CM | POA: Insufficient documentation

## 2016-06-23 DIAGNOSIS — L02211 Cutaneous abscess of abdominal wall: Secondary | ICD-10-CM | POA: Insufficient documentation

## 2016-06-23 DIAGNOSIS — R1013 Epigastric pain: Secondary | ICD-10-CM | POA: Diagnosis not present

## 2016-06-23 DIAGNOSIS — E538 Deficiency of other specified B group vitamins: Secondary | ICD-10-CM | POA: Diagnosis not present

## 2016-06-23 DIAGNOSIS — M818 Other osteoporosis without current pathological fracture: Secondary | ICD-10-CM | POA: Insufficient documentation

## 2016-06-23 DIAGNOSIS — M5442 Lumbago with sciatica, left side: Secondary | ICD-10-CM

## 2016-06-23 DIAGNOSIS — M5117 Intervertebral disc disorders with radiculopathy, lumbosacral region: Secondary | ICD-10-CM | POA: Insufficient documentation

## 2016-06-23 DIAGNOSIS — M5125 Other intervertebral disc displacement, thoracolumbar region: Secondary | ICD-10-CM | POA: Insufficient documentation

## 2016-06-23 DIAGNOSIS — R700 Elevated erythrocyte sedimentation rate: Secondary | ICD-10-CM | POA: Insufficient documentation

## 2016-06-23 DIAGNOSIS — G894 Chronic pain syndrome: Secondary | ICD-10-CM

## 2016-06-23 DIAGNOSIS — M48061 Spinal stenosis, lumbar region without neurogenic claudication: Secondary | ICD-10-CM | POA: Diagnosis not present

## 2016-06-23 DIAGNOSIS — K7581 Nonalcoholic steatohepatitis (NASH): Secondary | ICD-10-CM | POA: Insufficient documentation

## 2016-06-23 DIAGNOSIS — K219 Gastro-esophageal reflux disease without esophagitis: Secondary | ICD-10-CM | POA: Diagnosis not present

## 2016-06-23 DIAGNOSIS — E039 Hypothyroidism, unspecified: Secondary | ICD-10-CM | POA: Diagnosis not present

## 2016-06-23 DIAGNOSIS — I129 Hypertensive chronic kidney disease with stage 1 through stage 4 chronic kidney disease, or unspecified chronic kidney disease: Secondary | ICD-10-CM | POA: Diagnosis not present

## 2016-06-23 DIAGNOSIS — F1123 Opioid dependence with withdrawal: Secondary | ICD-10-CM | POA: Insufficient documentation

## 2016-06-23 DIAGNOSIS — M4804 Spinal stenosis, thoracic region: Secondary | ICD-10-CM | POA: Insufficient documentation

## 2016-06-23 DIAGNOSIS — G5603 Carpal tunnel syndrome, bilateral upper limbs: Secondary | ICD-10-CM | POA: Insufficient documentation

## 2016-06-23 DIAGNOSIS — Z79891 Long term (current) use of opiate analgesic: Secondary | ICD-10-CM | POA: Insufficient documentation

## 2016-06-23 DIAGNOSIS — E1122 Type 2 diabetes mellitus with diabetic chronic kidney disease: Secondary | ICD-10-CM | POA: Diagnosis not present

## 2016-06-23 DIAGNOSIS — Z8673 Personal history of transient ischemic attack (TIA), and cerebral infarction without residual deficits: Secondary | ICD-10-CM | POA: Insufficient documentation

## 2016-06-23 DIAGNOSIS — I10 Essential (primary) hypertension: Secondary | ICD-10-CM | POA: Insufficient documentation

## 2016-06-23 DIAGNOSIS — E559 Vitamin D deficiency, unspecified: Secondary | ICD-10-CM | POA: Diagnosis not present

## 2016-06-23 DIAGNOSIS — F419 Anxiety disorder, unspecified: Secondary | ICD-10-CM | POA: Insufficient documentation

## 2016-06-23 DIAGNOSIS — F329 Major depressive disorder, single episode, unspecified: Secondary | ICD-10-CM | POA: Insufficient documentation

## 2016-06-23 DIAGNOSIS — Z794 Long term (current) use of insulin: Secondary | ICD-10-CM | POA: Insufficient documentation

## 2016-06-23 DIAGNOSIS — R748 Abnormal levels of other serum enzymes: Secondary | ICD-10-CM | POA: Insufficient documentation

## 2016-06-23 DIAGNOSIS — M47816 Spondylosis without myelopathy or radiculopathy, lumbar region: Secondary | ICD-10-CM | POA: Insufficient documentation

## 2016-06-23 DIAGNOSIS — K7290 Hepatic failure, unspecified without coma: Secondary | ICD-10-CM | POA: Insufficient documentation

## 2016-06-23 DIAGNOSIS — R188 Other ascites: Secondary | ICD-10-CM | POA: Insufficient documentation

## 2016-06-23 DIAGNOSIS — K5900 Constipation, unspecified: Secondary | ICD-10-CM | POA: Insufficient documentation

## 2016-06-23 DIAGNOSIS — M542 Cervicalgia: Secondary | ICD-10-CM | POA: Insufficient documentation

## 2016-06-23 DIAGNOSIS — M5414 Radiculopathy, thoracic region: Secondary | ICD-10-CM | POA: Diagnosis not present

## 2016-06-23 DIAGNOSIS — M47817 Spondylosis without myelopathy or radiculopathy, lumbosacral region: Secondary | ICD-10-CM | POA: Insufficient documentation

## 2016-06-23 DIAGNOSIS — M5441 Lumbago with sciatica, right side: Secondary | ICD-10-CM

## 2016-06-23 NOTE — Telephone Encounter (Signed)
Attempted to call patient, no answer, unable to leave a message. Spoke with Crystal and Dr. Dossie Arbour. Ask patient if she has had physical therapy. If not, will order it. Also may do left lumbar facet block without steroids, but must wait 2 weeks.

## 2016-06-23 NOTE — Progress Notes (Signed)
Patient's Name: Lori Mckenzie  MRN: [id number]  Referring Provider: Ardine Eng, MD  DOB: 1965/12/28  PCP: Ardine Eng, MD  DOS: 06/23/2016  Note by: Vevelyn Francois NP  Service setting: Ambulatory outpatient  Specialty: Interventional Pain Management  Location: [facility] (AMB) Pain Management Facility    Patient type: Established    Primary Reason(s) for Visit: Encounter for prescription drug management & post-procedure evaluation of chronic illness with mild to moderate exacerbation(Level of risk: moderate) CC: Neck Pain and Back Pain (middle and lower)  HPI  [name] is a 51 y.o. year old, female patient, who comes today for a post-procedure evaluation and medication management. She has Type 2 diabetes mellitus ([place]); COPD (chronic obstructive pulmonary disease) (HCC); GERD (gastroesophageal reflux disease); OSA on CPAP; Anxiety; Steatohepatitis; Dysthymia; Other social stressor; Ascites; NASH (nonalcoholic steatohepatitis); Hypokalemia; Opiate use (75 MME/Day); Long term prescription opiate use; Long term current use of opiate analgesic; Encounter for therapeutic drug level monitoring; Chronic epigastric abdominal pain; Chronic low back pain (Location of Primary Source of Pain) (Bilateral) (R>L); Chronic neck pain (Location of Tertiary source of pain) (Bilateral) (R>L); Breath shortness; Diastolic dysfunction; Clinical depression; Airway hyperreactivity; Essential (primary) hypertension; 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, L1-2, & L4-5); Lumbar and sacral osteoarthritis; Myofascial pain; Drowsiness; Episode of syncope; Subacute lumbar radiculopathy (left side) (S1 dermatome); Vitamin D deficiency; B12 deficiency; Folate deficiency; Thoracic radiculitis; Elevated sedimentation rate; Elevated C-reactive protein (CRP); Lumbar facet syndrome (Location of Primary Source of Pain) (Bilateral) (R>L); Cervical spondylosis; Chronic feet pain (Location of Secondary source of pain) (Bilateral) (R>L); Lumbar  spondylosis; Encounter for chronic pain management; Chronic shoulder pain (Bilateral) (R>L); Chronic carpal tunnel syndrome (Bilateral); Chronic hip pain (Bilateral) (L>R); Chronic upper back pain (Bilateral) (L>R); Osteoporosis, idiopathic; Abnormal MRI, lumbar spine (02/03/2015); Hepatic encephalopathy ([place]); Radicular pain of thoracic region; Altered mental status; Cirrhosis of liver without ascites ([place]); Elevated liver enzymes; Opiate withdrawal ([place]); Chronic pain syndrome; Asthma; Lumbar spinal stenosis; Lumbosacral spondylosis without myelopathy; Neuromyositis; Somnolence; Spinal stenosis, lumbar region without neurogenic claudication (L1-2 and L4-5); Spondylosis of lumbar region without myelopathy or radiculopathy; Allodynia; Osteoarthritis; Chronic pain of left upper extremity; Chronic radicular cervical pain (L); Chronic sacroiliac joint pain (Bilateral) (L>R); Diabetes mellitus, insulin dependent (IDDM), uncontrolled ([place]); Chronic pain of lower extremity, bilateral; Spinal stenosis, thoracic region (T10-11); Nausea without vomiting; Cirrhosis ([place]); Constipation; Hyperglycemia; and Uncontrolled diabetes mellitus ([place]) on her problem list. Her primarily concern today is the Neck Pain and Back Pain (middle and lower)  Pain Assessment: Self-Reported Pain Score: 4 /10             Reported level is compatible with observation.       Pain Type: Chronic pain Pain Location: Neck Pain Orientation: Left Pain Descriptors / Indicators: Burning (jabbing, like a crick) Pain Frequency: Constant  [name] was last seen on 06/06/16 for a procedure. During today's appointment we reviewed [name]'s post-procedure results. She is status post bilateral lumbar facet block. She is wondering what the next treatment will be. She admits that the use of steroids causes her blood glucose to be elevated. She was hospitalized after her last interventional therapy with the steroid. She admits that she didn't get more  benefit with the steroid.  Further details on both, my assessment(s), as well as the proposed treatment plan, please see below.  Controlled Substance Pharmacotherapy Assessment REMS (Risk Evaluation and Mitigation Strategy)  No Opioids Landis Martins, RN  06/23/2016  9:05 AM  Sign at close encounter  Safety precautions to be maintained throughout the outpatient stay will include: orient to surroundings, keep bed in low position, maintain call bell within reach at all times, provide assistance with transfer out of bed and ambulation.    Pharmacokinetics: Liberation and absorption (onset of action): WNL Distribution (time to peak effect): WNL Metabolism and excretion (duration of action): WNL         Pharmacodynamics: Desired effects: Analgesia: [name] reports >50% benefit. Functional ability: Patient reports that medication allows her to accomplish basic ADLs Clinically meaningful improvement in function (CMIF): Sustained CMIF goals met Perceived effectiveness: Described as relatively effective, allowing for increase in activities of daily living (ADL) Undesirable effects: Side-effects or Adverse reactions: None reported Monitoring: [place] PMP: Online review of the past 76-monthperiod conducted. Compliant with practice rules and regulations List of all UDS test(s) done:  Lab Results  Component Value Date   TOXASSSELUR FINAL 01/07/2016   T[place]hemiaFINAL 09/28/2015   [place]aneFINAL 03/05/2015   TOXASSSELUR FINAL 02/05/2015   [place]AL 01/08/2015   SUMMARY FINAL 04/12/2016   Last UDS on record: [employer]ox[employer] Select 13  Date Value Ref Range Status  01/07/2016 FINAL  Final    Comment:    ==================================================================== TOXASSURE SELECT 13 (MW) ==================================================================== Test                             Result       Flag       Units Drug Present and Declared for Prescription Verification    Alprazolam                     164          EXPECTED   ng/mg creat   Alpha-hydroxyalprazolam        410          EXPECTED   ng/mg creat    Source of alprazolam is a scheduled prescription medication.    Alpha-hydroxyalprazolam is an expected metabolite of alprazolam. Drug Absent but Declared for Prescription Verification   Oxycodone                      Not Detected UNEXPECTED ng/mg creat ==================================================================== Test                      Result    Flag   Units      Ref Range   Creatinine              140              mg/dL      >=20 ==================================================================== Declared Medications:  The flagging and interpretation on this report are based on the  following declared medications.  Unexpected results may arise from  inaccuracies in the declared medications.  **Note: The testing scope of this panel includes these medications:  Alprazolam  Oxycodone  **Note: The testing scope of this panel does not include following  reported medications:  Albuterol  Aspirin  Cholecalciferol  Citalopram (Celexa)  Cyanocobalamin  Dicyclomine  Fluticasone (Flonase)  Folic acid (Folate)  Furosemide  Gabapentin  Hydrochlorothiazide  Ipratropium  Lactulose  Naloxone (Narcan)  Nystatin  Ondansetron  Pantoprazole  Promethazine  Rifaximin  Rizatriptan  Spironolactone  Vitamin B12 ==================================================================== For clinical consultation, please call [phone] ====================================================================    Summary  Date Value Ref Range Status  04/12/2016 FINAL  Final  Comment:    ==================================================================== TOXASSURE SELECT 13 (MW) ==================================================================== Test                             Result       Flag       Units Drug Present and Declared for  Prescription Verification   Alprazolam                     590          EXPECTED   ng/mg creat   Alpha-hydroxyalprazolam        914          EXPECTED   ng/mg creat    Source of alprazolam is a scheduled prescription medication.    Alpha-hydroxyalprazolam is an expected metabolite of alprazolam. Drug Present not Declared for Prescription Verification   Alcohol, Ethyl                 0.054        UNEXPECTED g/dL    Sources of ethyl alcohol include alcoholic beverages or as a    fermentation product of glucose; glucose was not detected in this    specimen. Ethyl alcohol result should be interpreted in the    context of all available clinical and behavioral information. Drug Absent but Declared for Prescription Verification   Oxycodone                      Not Detected UNEXPECTED ng/mg creat ==================================================================== Test                      Result    Flag   Units      Ref Range   Creatinine              63               mg/dL      >=20 ==================================================================== Declared Medications:  The flagging and interpretation on this report are based on the  following declared medications.  Unexpected results may arise from  inaccuracies in the declared medications.  **Note: The testing scope of this panel includes these medications:  Alprazolam (Xanax)  Oxycodone  **Note: The testing scope of this panel does not include following  reported medications:  Albuterol  Aspirin  Cholecalciferol  Citalopram (Celexa)  Cyanocobalamin  Diclofenac (Voltaren)  Dicyclomine (Bentyl)  Fluticasone (Flonase)  Folic acid (Folate)  Furosemide (Lasix)  Gabapentin  Insulin  Insulin (NovoLog)  Ipratropium (Atrovent)  Lactulose  Naloxone (Narcan)  Nystatin  Pantoprazole  Promethazine  Rifaximin (Xifaxan)  Rizatriptan (Maxalt)  Spironolactone  Sucralfate (Carafate)  Vitamin  B12 ==================================================================== For clinical consultation, please call [phone]. ====================================================================    UDS interpretation: Compliant          Medication Assessment Form: Reviewed. Patient indicates being compliant with therapy Treatment compliance: Compliant Risk Assessment Profile: Aberrant behavior: prescription drug misuse, non-compliance with medical instructions on the proper use of the medication, repeated negotiations to obtain more medication and See prior evaluations. None observed or detected today Comorbid factors increasing risk of overdose: Sleep Apnea, Age 67-47 years old and Caucasian Risk of substance use disorder (SUD): Very High Opioid Risk Tool (ORT) Total Score: 2  Interpretation Table:  Score <3 = Low Risk for SUD  Score between 4-7 = Moderate Risk for SUD  Score >8 = High Risk for Opioid Abuse  Risk Mitigation Strategies:  Patient Counseling: Covered Patient-Prescriber Agreement (PPA): Present and active  Notification to other healthcare providers: Done  Pharmacologic Plan: No change in therapy, at this time  Post-Procedure Assessment  Visit date not found Procedure:06/06/16 Pre-procedure pain score:  4/10 Post-procedure pain score: 02/10         Influential Factors: BMI: 47.55 kg/m Intra-procedural challenges: None observed Assessment challenges: None detected         Post-procedural adverse reactions or complications: None reported Reported side-effects: None  Sedation: Please see nurses note. When no sedatives are used, the analgesic levels obtained are directly associated to the effectiveness of the local anesthetics. However, when sedation is provided, the level of analgesia obtained during the initial 1 hour following the intervention, is believed to be the result of a combination of factors. These factors may include, but are not limited to: 1. The  effectiveness of the local anesthetics used. 2. The effects of the analgesic(s) and/or anxiolytic(s) used. 3. The degree of discomfort experienced by the patient at the time of the procedure. 4. The patients ability and reliability in recalling and recording the events. 5. The presence and influence of possible secondary gains and/or psychosocial factors. Reported result: Relief experienced during the 1st hour after the procedure: 75 % (Ultra-Short Term Relief) Interpretative annotation: Analgesia during this period is likely to be Local Anesthetic and/or IV Sedative (Analgesic/Anxiolitic) related.          Effects of local anesthetic: The analgesic effects attained during this period are directly associated to the localized infiltration of local anesthetics and therefore cary significant diagnostic value as to the etiological location, or anatomical origin, of the pain. Expected duration of relief is directly dependent on the pharmacodynamics of the local anesthetic used. Long-acting (4-6 hours) anesthetics used.  Reported result: Relief during the next 4 to 6 hour after the procedure: 75 % (Short-Term Relief) Interpretative annotation: Complete relief would suggest area to be the source of the pain.          Long-term benefit: Defined as the period of time past the expected duration of local anesthetics. With the possible exception of prolonged sympathetic blockade from the local anesthetics, benefits during this period are typically attributed to, or associated with, other factors such as analgesic sensory neuropraxia, antiinflammatory effects, or beneficial biochemical changes provided by agents other than the local anesthetics Reported result: Extended relief following procedure: 40 % (Long-Term Relief) Interpretative annotation: Good relief. This could suggest inflammation to be a significant component in the etiology to the pain.          Current benefits: Defined as persistent relief that  continues at this point in time.   Reported results: Treated area: 40 %       Interpretative annotation: Ongoing benefits would suggest effective therapeutic approach  Interpretation: Results would suggest a successful diagnostic intervention.          Laboratory Chemistry  Inflammation Markers Lab Results  Component Value Date   CRP <0.8 04/12/2016   ESRSEDRATE 4 04/12/2016   (CRP: Acute Phase) (ESR: Chronic Phase) Renal Function Markers Lab Results  Component Value Date   BUN 17 06/13/2016   CREATININE 0.85 06/13/2016   GFRAA >60 06/13/2016   GFRNONAA >60 06/13/2016   Hepatic Function Markers Lab Results  Component Value Date   AST 65 (H) 08/19/2015   ALT 82 (H) 08/19/2015   ALBUMIN 4.8 08/19/2015   ALKPHOS 116 08/19/2015   Electrolytes Lab Results  Component Value Date  NA 134 (L) 06/13/2016   K 4.4 06/13/2016   CL 98 (L) 06/13/2016   CALCIUM 9.5 06/13/2016   MG 1.9 01/08/2015   Neuropathy Markers Lab Results  Component Value Date   VITAMINB12 272 04/12/2016   Bone Pathology Markers Lab Results  Component Value Date   ALKPHOS 116 08/19/2015   VD25OH 14.3 (L) 01/08/2015   VD125OH2TOT 28.8 01/08/2015   25OHVITD1 36 04/12/2016   25OHVITD2 3.9 04/12/2016   25OHVITD3 32 04/12/2016   CALCIUM 9.5 06/13/2016   Coagulation Parameters Lab Results  Component Value Date   INR 1.18 12/01/2015   LABPROT 15.1 12/01/2015   APTT 24.6 08/26/2013   PLT 111 (L) 06/13/2016   Cardiovascular Markers Lab Results  Component Value Date   BNP 11 12/11/2012   HGB 16.6 (H) 06/13/2016   HCT 46.4 06/13/2016   Note: Lab results reviewed.  Recent Diagnostic Imaging Review  No results found. Note: Imaging results reviewed.          Meds  The patient has a current medication list which includes the following prescription(s): albuterol, alprazolam, cholecalciferol, citalopram, diclofenac sodium, dicyclomine, fluticasone, intrinsi b12-folate, furosemide, gabapentin,  insulin aspart, insulin detemir, ipratropium, lactulose, naloxone, nystatin, pantoprazole, potassium chloride sa, promethazine, rifaximin, rizatriptan, spironolactone, and vitamin b-12.  Current Outpatient Prescriptions on File Prior to Visit  Medication Sig  . albuterol (ACCUNEB) 0.63 MG/3ML nebulizer solution Inhale 1 ampule by nebulization every six (6) hours as needed for wheezing.  Marland Kitchen ALPRAZolam (XANAX) 1 MG tablet Take 1-2 mg by mouth 3 (three) times daily as needed for anxiety.  . Cholecalciferol (VITAMIN D-1000 MAX ST) 1000 units tablet Take 2,000 Units by mouth daily.   . citalopram (CELEXA) 20 MG tablet Take 20 mg by mouth daily.   . diclofenac sodium (VOLTAREN) 1 % GEL Apply 4 g topically 4 (four) times daily.  Marland Kitchen dicyclomine (BENTYL) 10 MG capsule Take 10 mg by mouth 4 (four) times daily -  before meals and at bedtime.   . fluticasone (FLONASE) 50 MCG/ACT nasal spray Place 1-2 sprays into both nostrils daily as needed for rhinitis.   Derald Macleod Factor (INTRINSI B12-FOLATE) [phone] MCG-MCG-MG TABS Take 1 tablet by mouth daily. Reported on 04/06/2015  . furosemide (LASIX) 40 MG tablet Take 80 mg by mouth as needed.   . gabapentin (NEURONTIN) 300 MG capsule Take 900 mg by mouth 4 (four) times daily as needed (for pain).   . insulin aspart (NOVOLOG) 100 UNIT/ML injection 20 units with meals 3 times daily plus sliding scale.  MDD 80 units.  . Insulin Detemir (LEVEMIR FLEXPEN [place]) Inject 50 Units into the skin 2 (two) times daily.   Marland Kitchen ipratropium (ATROVENT) 0.02 % nebulizer solution Inhale 500 mcg by nebulization Four (4) times a day.  . lactulose (CHRONULAC) 10 GM/15ML solution Take 30 g by mouth 3 (three) times daily as needed.   . naloxone [facility]) 2 MG/2ML injection Inject into the vein as needed.  . nystatin (MYCOSTATIN) powder Apply 1 g topically 3 (three) times daily as needed (for irritation).   . pantoprazole (PROTONIX) 40 MG tablet Take 40 mg by mouth daily.   .  potassium chloride SA (K-DUR,KLOR-CON) 20 MEQ tablet Take 20 mEq by mouth as needed.  . promethazine (PHENERGAN) 12.5 MG tablet Take 12.5 mg by mouth every 6 (six) hours as needed for nausea or vomiting.   . rifaximin (XIFAXAN) 550 MG TABS tablet Take 550 mg by mouth 2 (two) times daily.   . rizatriptan (MAXALT)  10 MG tablet Take 10 mg by mouth as needed for migraine.   Marland Kitchen spironolactone (ALDACTONE) 100 MG tablet Take 100 mg by mouth daily.   . vitamin B-12 (CYANOCOBALAMIN) 1000 MCG tablet Take 2,000 mcg by mouth daily.  . [DISCONTINUED] SUMAtriptan (IMITREX) 50 MG tablet Take 50 mg by mouth every 2 (two) hours as needed. For migraines   No current facility-administered medications on file prior to visit.    ROS  Constitutional: Denies any fever or chills Gastrointestinal: No reported hemesis, hematochezia, vomiting, or acute GI distress Musculoskeletal: Denies any acute onset joint swelling, redness, loss of ROM, or weakness Neurological: No reported episodes of acute onset apraxia, aphasia, dysarthria, agnosia, amnesia, paralysis, loss of coordination, or loss of consciousness  Allergies  [name] is allergic to tape and vicodin [hydrocodone-acetaminophen].  PFSH  Drug: [name]  reports that she does not use drugs. Alcohol:  reports that she does not drink alcohol. Tobacco:  reports that she has never smoked. She has never used smokeless tobacco. Medical:  has a past medical history of Abdominal abscess (08/25/2014); Acid reflux (08/10/2010); Acute cervical myofascial strain (02/09/2016); Anxiety; Ascites; Asthma; Back pain; Bile leak, postoperative (07/17/2014); Brittle bone disease; Cancer ([place]); Cervical disc disease; Chronic kidney disease; Collagen vascular disease ([place]); COPD (chronic obstructive pulmonary disease) ([place]); Diabetes mellitus without complication ([place]); GERD (gastroesophageal reflux disease); Hypertension; Hypothyroidism; Left upper quadrant pain (01/09/2014); Major depressive  disorder with single episode (12/05/2011); Major depressive disorder, single episode (12/05/2011); Migraines; NASH (nonalcoholic steatohepatitis); Respiratory infection; Shock ([place]) (09/18/2014); Sleep apnea; Sleep apnea; Syncope (11/16/2014); Thyroid disease; and TIA (transient ischemic attack). Family: family history includes Heart disease in her brother and sister; Lung cancer in her father; Ulcers in her father and sister.  Past Surgical History:  Procedure Laterality Date  . ABDOMINAL HYSTERECTOMY    . CHOLECYSTECTOMY N/A 07/15/2014   Procedure: LAPAROSCOPIC CHOLECYSTECTOMY with liver biopsy ;  Surgeon: Sherri Rad, MD;  Location: [facility] ORS;  Service: General;  Laterality: N/A;  . COLONOSCOPY WITH PROPOFOL N/A 06/23/2014   Procedure: COLONOSCOPY WITH PROPOFOL;  Surgeon: Lollie Sails, MD;  Location: [facility] ENDOSCOPY;  Service: Endoscopy;  Laterality: N/A;  . ERCP N/A 07/16/2014   Procedure: ENDOSCOPIC RETROGRADE CHOLANGIOPANCREATOGRAPHY (ERCP);  Surgeon: Clarene Essex, MD;  Location: [facility] ENDOSCOPY;  Service: Endoscopy;  Laterality: N/A;  . ERCP N/A 10/03/2014   Procedure: ENDOSCOPIC RETROGRADE CHOLANGIOPANCREATOGRAPHY (ERCP);  Surgeon: Hulen Luster, MD;  Location: [facility] ENDOSCOPY;  Service: Gastroenterology;  Laterality: N/A;  . ESOPHAGOGASTRODUODENOSCOPY N/A 06/23/2014   Procedure: ESOPHAGOGASTRODUODENOSCOPY (EGD);  Surgeon: Lollie Sails, MD;  Location: [facility] ENDOSCOPY;  Service: Endoscopy;  Laterality: N/A;  . ESOPHAGOGASTRODUODENOSCOPY (EGD) WITH PROPOFOL N/A 12/01/2015   Procedure: ESOPHAGOGASTRODUODENOSCOPY (EGD) WITH PROPOFOL;  Surgeon: Lollie Sails, MD;  Location: [facility] ENDOSCOPY;  Service: Endoscopy;  Laterality: N/A;  . TUBAL LIGATION    . WISDOM TOOTH EXTRACTION     Constitutional Exam  General appearance: oriented and moderately obese Vitals:   06/23/16 0858  BP: 128/74  Pulse: 97  Resp: 20  Temp: 97.9 F (36.6 C)  TempSrc: Oral  SpO2: 96%  Weight: 260 lb (117.9 kg)  Height:  _0  (1.575 m)   BMI Assessment: Estimated body mass index is 47.55 kg/m as calculated from the following:   Height as of this encounter: _1  (1.575 m).   Weight as of this encounter: 260 lb (117.9 kg).  BMI interpretation table: BMI level Category Range association with higher incidence of chronic pain  <  18 kg/m2 Underweight   18.5-24.9 kg/m2 Ideal body weight   25-29.9 kg/m2 Overweight Increased incidence by 20%  30-34.9 kg/m2 Obese (Class I) Increased incidence by 68%  35-39.9 kg/m2 Severe obesity (Class II) Increased incidence by 136%  >40 kg/m2 Extreme obesity (Class III) Increased incidence by 254%   BMI Readings from Last 4 Encounters:  06/23/16 47.55 kg/m  06/13/16 45.91 kg/m  06/06/16 47.55 kg/m  05/19/16 47.55 kg/m   Wt Readings from Last 4 Encounters:  06/23/16 260 lb (117.9 kg)  06/13/16 251 lb (113.9 kg)  06/06/16 260 lb (117.9 kg)  05/19/16 260 lb (117.9 kg)  Psych/Mental status: Alert, oriented x 3 (person, place, & time)       Eyes: PERLA Respiratory: No evidence of acute respiratory distress  Cervical Spine Exam  Inspection: No masses, redness, or swelling Alignment: Symmetrical Functional ROM: Unrestricted ROM      Stability: No instability detected Muscle strength & Tone: Functionally intact Sensory: Unimpaired Palpation: No palpable anomalies              Upper Extremity (UE) Exam    Side: Right upper extremity  Side: Left upper extremity  Inspection: No masses, redness, swelling, or asymmetry. No contractures  Inspection: No masses, redness, swelling, or asymmetry. No contractures  Functional ROM: Unrestricted ROM          Functional ROM: Unrestricted ROM          Muscle strength & Tone: Functionally intact  Muscle strength & Tone: Functionally intact  Sensory: Unimpaired  Sensory: Unimpaired  Palpation: No palpable anomalies              Palpation: No palpable anomalies              Specialized Test(s): Deferred         Specialized Test(s):  Deferred          Thoracic Spine Exam  Inspection: No masses, redness, or swelling Alignment: Symmetrical Functional ROM: Unrestricted ROM Stability: No instability detected Sensory: Unimpaired Muscle strength & Tone: No palpable anomalies  Lumbar Spine Exam  Inspection: No masses, redness, or swelling Alignment: Symmetrical Functional ROM: Unrestricted ROM      Stability: No instability detected Muscle strength & Tone: Functionally intact Sensory: Unimpaired Palpation: Complains of area being tender to palpation       Provocative Tests: Lumbar Hyperextension and rotation test: evaluation deferred today       Patrick's Maneuver: evaluation deferred today                    Gait & Posture Assessment  Ambulation: Unassisted Gait: Relatively normal for age and body habitus Posture: WNL   Lower Extremity Exam    Side: Right lower extremity  Side: Left lower extremity  Inspection: No masses, redness, swelling, or asymmetry. No contractures  Inspection: No masses, redness, swelling, or asymmetry. No contractures  Functional ROM: Unrestricted ROM          Functional ROM: Unrestricted ROM          Muscle strength & Tone: Functionally intact  Muscle strength & Tone: Functionally intact  Sensory: Unimpaired  Sensory: Unimpaired  Palpation: No palpable anomalies  Palpation: No palpable anomalies   Assessment  Primary Diagnosis & Pertinent Problem List: The primary encounter diagnosis was Lumbar facet syndrome (Location of Primary Source of Pain) (Bilateral) (R>L). Diagnoses of Chronic low back pain (Location of Primary Source of Pain) (Bilateral) (R>L), Lumbar spondylosis, Spinal stenosis, lumbar region without neurogenic  claudication (L1-2 and L4-5), and Chronic pain syndrome were also pertinent to this visit.  Status Diagnosis  Persistent Persistent Persistent 1. Lumbar facet syndrome (Location of Primary Source of Pain) (Bilateral) (R>L)   2. Chronic low back pain (Location of  Primary Source of Pain) (Bilateral) (R>L)   3. Lumbar spondylosis   4. Spinal stenosis, lumbar region without neurogenic claudication (L1-2 and L4-5)   5. Chronic pain syndrome     Problems updated and reviewed during this visit: Problem  Chronic Pain of Lower Extremity, Bilateral  Spinal stenosis, thoracic region (T10-11)   Moderate lower thoracic spinal stenosis at T10-T11   Chronic sacroiliac joint pain (Bilateral) (L>R)  Chronic Pain of Left Upper Extremity  Chronic radicular cervical pain (L)  Allodynia  Chronic Pain Syndrome  Radicular Pain of Thoracic Region  Lumbar facet syndrome (Location of Primary Source of Pain) (Bilateral) (R>L)  Cervical Spondylosis  Chronic feet pain (Location of Secondary source of pain) (Bilateral) (R>L)   Bottom and arc of the foot, bilaterally. Currently seeing a podiatrist for this.   Lumbar Spondylosis  Chronic shoulder pain (Bilateral) (R>L)  Chronic carpal tunnel syndrome (Bilateral)  Chronic hip pain (Bilateral) (L>R)  Chronic upper back pain (Bilateral) (L>R)  Thoracic Radiculitis  Chronic low back pain (Location of Primary Source of Pain) (Bilateral) (R>L)  Chronic neck pain (Location of Tertiary source of pain) (Bilateral) (R>L)  Osa On Cpap  Myofascial Pain  Lumbar Spinal Stenosis  Lumbosacral Spondylosis Without Myelopathy  Spinal stenosis, lumbar region without neurogenic claudication (L1-2 and L4-5)  Spondylosis of Lumbar Region Without Myelopathy Or Radiculopathy  Osteoarthritis  Opiate use (75 MME/Day)  Long Term Prescription Opiate Use  Long Term Current Use of Opiate Analgesic  Nausea Without Vomiting  Diabetes Mellitus, Insulin Dependent (Iddm), Uncontrolled (Hcc)  Opiate Withdrawal (Hcc)  Elevated Liver Enzymes  Cirrhosis of Liver Without Ascites (Hcc)  Altered Mental Status   Overview:  Possible hepatic encephalopathy   Hepatic Encephalopathy (Hcc)  Elevated Sedimentation Rate  Elevated C-Reactive Protein (Crp)   Encounter for Chronic Pain Management  Osteoporosis, Idiopathic  Abnormal MRI, lumbar spine (02/03/2015)   T10-11: Central disc extrusion, partially calcified; Moderate lower thoracic spinal stenosis. T11-12: Central disc protrusion; left facet hypertrophy; left T11 foraminal stenosis. T12-L1: Right paracentral disc protrusion. L1-2: Left paracentral and caudal disc extrusion; spinal stenosis at this level. L2-3: Disc bulge; facet hypertrophy. L3-4: Disc bulge; facet hypertrophy. L4-5: Circumferential disc bulge; severe facet hypertrophy; spinal stenosis. L5-S1: Disc bulge and facet hypertrophy.   Vitamin D Deficiency  B12 Deficiency  Folate Deficiency  Subacute lumbar radiculopathy (left side) (S1 dermatome)  Drowsiness  Episode of Syncope  Somnolence  Encounter for Therapeutic Drug Level Monitoring  Chronic epigastric abdominal pain  Ascites  [name] (Nonalcoholic Steatohepatitis)  Hypokalemia  Dysthymia  Other Social Stressor  Steatohepatitis  Type 2 diabetes mellitus (HCC)  COPD (chronic obstructive pulmonary disease) (HCC)  Gerd (Gastroesophageal Reflux Disease)  Anxiety  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, L1-2, & L4-5)  Lumbar and Sacral Osteoarthritis  Neuromyositis  Breath Shortness  Diastolic Dysfunction  Airway Hyperreactivity  Essential (Primary) Hypertension  Asthma  Clinical Depression  Constipation  Hyperglycemia  Cirrhosis (Hcc)  Uncontrolled Diabetes Mellitus (Hcc)   Plan of Care  Pharmacotherapy (Medications Ordered): No orders of the defined types were placed in this encounter.  New Prescriptions   No medications on file   Medications administered today: [name] had no medications administered during this visit. Lab-work, procedure(s), and/or referral(s): Orders Placed  This Encounter  Procedures  . Radiofrequency,Lumbar  . LUMBAR FACET(MEDIAL BRANCH NERVE BLOCK) MBNB  . Ambulatory referral to Physical Therapy   Imaging and/or  referral(s): AMB REFERRAL TO PHYSICAL THERAPY  Interventional therapies: Planned, scheduled, and/or pending:   Left sided radiofrequency ablation under fluoroscopic guidance with sedation    Considering:   Diagnostic bilateral cervical facet block Diagnostic L1-2 lumbar epidural steroid injection Diagnostic, right-sided L4-5 lumbar epidural steroid injection Diagnostic bilateral intra-articular shoulder joint injection Diagnostic bilateral suprascapular nerve block Possible bilateral suprascapular nerve radiofrequency ablation Palliative left-sided L5-S1 lumbar epidural steroid injection  Diagnostic left S1 selective nerve root block Diagnostic right-sided cervical epidural steroid injection Diagnostic T10-11 thoracic epidural steroid injection Diagnostic bilateral intra-articular hip joint injection Possible bilateral hip radiofrequency ablation   Palliative PRN treatment(s):   Diagnostic L1-2 lumbar epidural steroid injection Diagnostic, right-sided L4-5 lumbar epidural steroid injection Diagnostic T10-11 thoracic epidural steroid injection    Provider-requested follow-up: Return for w/ Dr. Dossie Arbour, ([facility]).  Future Appointments Date Time Provider Walker  09/07/2016 10:15 AM Milinda Pointer, MD [facility] None   Primary Care Physician: Ardine Eng, MD Location: [facility] Note by: Vevelyn Francois NP Date: 06/23/2016; Time: 3:27 PM  Pain Score Disclaimer: We use the NRS-11 scale. This is a self-reported, subjective measurement of pain severity with only modest accuracy. It is used primarily to identify changes within a particular patient. It must be understood that outpatient pain scales are significantly less accurate that those used for research, where they can be applied under ideal controlled circumstances with minimal exposure to variables. In reality, the score is likely to be a combination of pain intensity and pain affect, where  pain affect describes the degree of emotional arousal or changes in action readiness caused by the sensory experience of pain. Factors such as social and work situation, setting, emotional state, anxiety levels, expectation, and prior pain experience may influence pain perception and show large inter-individual differences that may also be affected by time variables.  Patient instructions provided during this appointment: Patient Instructions   ____________________________________________________________________________________________  Pain Scale  Introduction: The pain score used by this practice is the Verbal Numerical Rating Scale (VNRS-11). This is an 11-point scale. It is for adults and children 10 years or older. There are significant differences in how the pain score is reported, used, and applied. Forget everything you learned in the past and learn this scoring system.  General Information: The scale should reflect your current level of pain. Unless you are specifically asked for the level of your worst pain, or your average pain. If you are asked for one of these two, then it should be understood that it is over the past 24 hours.  Basic Activities of Daily Living (ADL): Personal hygiene, dressing, eating, transferring, and using restroom.  Instructions: Most patients tend to report their level of pain as a combination of two factors, their physical pain and their psychosocial pain. This last one is also known as "suffering" and it is reflection of how physical pain affects you socially and psychologically. From now on, report them separately. From this point on, when asked to report your pain level, report only your physical pain. Use the following table for reference.  Pain Clinic Pain Levels (0-5/10)  Pain Level Score  Description  No Pain 0   Mild pain 1 Nagging, annoying, but does not interfere with basic activities of daily living (ADL). Patients are able to eat, bathe, get  dressed, toileting (  being able to get on and off the toilet and perform personal hygiene functions), transfer (move in and out of bed or a chair without assistance), and maintain continence (able to control bladder and bowel functions). Blood pressure and heart rate are unaffected. A normal heart rate for a healthy adult ranges from 60 to 100 bpm (beats per minute).   Mild to moderate pain 2 Noticeable and distracting. Impossible to hide from other people. More frequent flare-ups. Still possible to adapt and function close to normal. It can be very annoying and may have occasional stronger flare-ups. With discipline, patients may get used to it and adapt.   Moderate pain 3 Interferes significantly with activities of daily living (ADL). It becomes difficult to feed, bathe, get dressed, get on and off the toilet or to perform personal hygiene functions. Difficult to get in and out of bed or a chair without assistance. Very distracting. With effort, it can be ignored when deeply involved in activities.   Moderately severe pain 4 Impossible to ignore for more than a few minutes. With effort, patients may still be able to manage work or participate in some social activities. Very difficult to concentrate. Signs of autonomic nervous system discharge are evident: dilated pupils (mydriasis); mild sweating (diaphoresis); sleep interference. Heart rate becomes elevated (>115 bpm). Diastolic blood pressure (lower number) rises above 100 mmHg. Patients find relief in laying down and not moving.   Severe pain 5 Intense and extremely unpleasant. Associated with frowning face and frequent crying. Pain overwhelms the senses.  Ability to do any activity or maintain social relationships becomes significantly limited. Conversation becomes difficult. Pacing back and forth is common, as getting into a comfortable position is nearly impossible. Pain wakes you up from deep sleep. Physical signs will be obvious: pupillary  dilation; increased sweating; goosebumps; brisk reflexes; cold, clammy hands and feet; nausea, vomiting or dry heaves; loss of appetite; significant sleep disturbance with inability to fall asleep or to remain asleep. When persistent, significant weight loss is observed due to the complete loss of appetite and sleep deprivation.  Blood pressure and heart rate becomes significantly elevated. Caution: If elevated blood pressure triggers a pounding headache associated with blurred vision, then the patient should immediately seek attention at an urgent or emergency care unit, as these may be signs of an impending stroke.    Emergency Department Pain Levels (6-10/10)  Emergency Room Pain 6 Severely limiting. Requires emergency care and should not be seen or managed at an outpatient pain management facility. Communication becomes difficult and requires great effort. Assistance to reach the emergency department may be required. Facial flushing and profuse sweating along with potentially dangerous increases in heart rate and blood pressure will be evident.   Distressing pain 7 Self-care is very difficult. Assistance is required to transport, or use restroom. Assistance to reach the emergency department will be required. Tasks requiring coordination, such as bathing and getting dressed become very difficult.   Disabling pain 8 Self-care is no longer possible. At this level, pain is disabling. The individual is unable to do even the most "basic" activities such as walking, eating, bathing, dressing, transferring to a bed, or toileting. Fine motor skills are lost. It is difficult to think clearly.   Incapacitating pain 9 Pain becomes incapacitating. Thought processing is no longer possible. Difficult to remember your own name. Control of movement and coordination are lost.   The worst pain imaginable 10 At this level, most patients pass out from pain. When this  level is reached, collapse of the autonomic nervous  system occurs, leading to a sudden drop in blood pressure and heart rate. This in turn results in a temporary and dramatic drop in blood flow to the brain, leading to a loss of consciousness. Fainting is one of the body's self defense mechanisms. Passing out puts the brain in a calmed state and causes it to shut down for a while, in order to begin the healing process.    Summary: 1. Refer to this scale when providing [place] with your pain level. 2. Be accurate and careful when reporting your pain level. This will help with your care. 3. Over-reporting your pain level will lead to loss of credibility. 4. Even a level of 1/10 means that there is pain and will be treated at our facility. 5. High, inaccurate reporting will be documented as "Symptom Exaggeration", leading to loss of credibility and suspicions of possible secondary gains such as obtaining more narcotics, or wanting to appear disabled, for fraudulent reasons. 6. Only pain levels of 5 or below will be seen at our facility. 7. Pain levels of 6 and above will be sent to the Emergency Department and the appointment cancelled.  Radiofrequency Lesioning Radiofrequency lesioning is a procedure that is performed to relieve pain. The procedure is often used for back, neck, or arm pain. Radiofrequency lesioning involves the use of a machine that creates radio waves to make heat. During the procedure, the heat is applied to the nerve that carries the pain signal. The heat damages the nerve and interferes with the pain signal. Pain relief usually starts about 2 weeks after the procedure and lasts for 6 months to 1 year. Tell a health care provider about:  Any allergies you have.  All medicines you are taking, including vitamins, herbs, eye drops, creams, and over-the-counter medicines.  Any problems you or family members have had with anesthetic medicines.  Any blood disorders you have.  Any surgeries you have had.  Any medical conditions you  have.  Whether you are pregnant or may be pregnant. What are the risks? Generally, this is a safe procedure. However, problems may occur, including:  Pain or soreness at the injection site.  Infection at the injection site.  Damage to nerves or blood vessels.  What happens before the procedure?  Ask your health care provider about: ? Changing or stopping your regular medicines. This is especially important if you are taking diabetes medicines or blood thinners. ? Taking medicines such as aspirin and ibuprofen. These medicines can thin your blood. Do not take these medicines before your procedure if your health care provider instructs you not to.  Follow instructions from your health care provider about eating or drinking restrictions.  Plan to have someone take you home after the procedure.  If you go home right after the procedure, plan to have someone with you for 24 hours. What happens during the procedure?  You will be given one or more of the following: ? A medicine to help you relax (sedative). ? A medicine to numb the area (local anesthetic).  You will be awake during the procedure. You will need to be able to talk with the health care provider during the procedure.  With the help of a type of X-ray (fluoroscopy), the health care provider will insert a radiofrequency needle into the area to be treated.  Next, a wire that carries the radio waves (electrode) will be put through the radiofrequency needle. An electrical pulse will  be sent through the electrode to verify the correct nerve. You will feel a tingling sensation, and you may have muscle twitching.  Then, the tissue that is around the needle tip will be heated by an electric current that is passed using the radiofrequency machine. This will numb the nerves.  A bandage (dressing) will be put on the insertion area after the procedure is done. The procedure may vary among health care providers and hospitals. What  happens after the procedure?  Your blood pressure, heart rate, breathing rate, and blood oxygen level will be monitored often until the medicines you were given have worn off.  Return to your normal activities as directed by your health care provider. This information is not intended to replace advice given to you by your health care provider. Make sure you discuss any questions you have with your health care provider. Document Released: 08/18/2010 Document Revised: 05/28/2015 Document Reviewed: 01/27/2014 Elsevier Interactive Patient Education  [employer].  ____________________________________________________________________________________________

## 2016-06-23 NOTE — Progress Notes (Signed)
Safety precautions to be maintained throughout the outpatient stay will include: orient to surroundings, keep bed in low position, maintain call bell within reach at all times, provide assistance with transfer out of bed and ambulation.  

## 2016-06-23 NOTE — Telephone Encounter (Signed)
Attempted to call patient again, unable to leave message.

## 2016-06-23 NOTE — Patient Instructions (Addendum)
____________________________________________________________________________________________  Pain Scale  Introduction: The pain score used by this practice is the Verbal Numerical Rating Scale (VNRS-11). This is an 11-point scale. It is for adults and children 10 years or older. There are significant differences in how the pain score is reported, used, and applied. Forget everything you learned in the past and learn this scoring system.  General Information: The scale should reflect your current level of pain. Unless you are specifically asked for the level of your worst pain, or your average pain. If you are asked for one of these two, then it should be understood that it is over the past 24 hours.  Basic Activities of Daily Living (ADL): Personal hygiene, dressing, eating, transferring, and using restroom.  Instructions: Most patients tend to report their level of pain as a combination of two factors, their physical pain and their psychosocial pain. This last one is also known as "suffering" and it is reflection of how physical pain affects you socially and psychologically. From now on, report them separately. From this point on, when asked to report your pain level, report only your physical pain. Use the following table for reference.  Pain Clinic Pain Levels (0-5/10)  Pain Level Score  Description  No Pain 0   Mild pain 1 Nagging, annoying, but does not interfere with basic activities of daily living (ADL). Patients are able to eat, bathe, get dressed, toileting (being able to get on and off the toilet and perform personal hygiene functions), transfer (move in and out of bed or a chair without assistance), and maintain continence (able to control bladder and bowel functions). Blood pressure and heart rate are unaffected. A normal heart rate for a healthy adult ranges from 60 to 100 bpm (beats per minute).   Mild to moderate pain 2 Noticeable and distracting. Impossible to hide from other  people. More frequent flare-ups. Still possible to adapt and function close to normal. It can be very annoying and may have occasional stronger flare-ups. With discipline, patients may get used to it and adapt.   Moderate pain 3 Interferes significantly with activities of daily living (ADL). It becomes difficult to feed, bathe, get dressed, get on and off the toilet or to perform personal hygiene functions. Difficult to get in and out of bed or a chair without assistance. Very distracting. With effort, it can be ignored when deeply involved in activities.   Moderately severe pain 4 Impossible to ignore for more than a few minutes. With effort, patients may still be able to manage work or participate in some social activities. Very difficult to concentrate. Signs of autonomic nervous system discharge are evident: dilated pupils (mydriasis); mild sweating (diaphoresis); sleep interference. Heart rate becomes elevated (>115 bpm). Diastolic blood pressure (lower number) rises above 100 mmHg. Patients find relief in laying down and not moving.   Severe pain 5 Intense and extremely unpleasant. Associated with frowning face and frequent crying. Pain overwhelms the senses.  Ability to do any activity or maintain social relationships becomes significantly limited. Conversation becomes difficult. Pacing back and forth is common, as getting into a comfortable position is nearly impossible. Pain wakes you up from deep sleep. Physical signs will be obvious: pupillary dilation; increased sweating; goosebumps; brisk reflexes; cold, clammy hands and feet; nausea, vomiting or dry heaves; loss of appetite; significant sleep disturbance with inability to fall asleep or to remain asleep. When persistent, significant weight loss is observed due to the complete loss of appetite and sleep deprivation.  Blood  pressure and heart rate becomes significantly elevated. Caution: If elevated blood pressure triggers a pounding headache  associated with blurred vision, then the patient should immediately seek attention at an urgent or emergency care unit, as these may be signs of an impending stroke.    Emergency Department Pain Levels (6-10/10)  Emergency Room Pain 6 Severely limiting. Requires emergency care and should not be seen or managed at an outpatient pain management facility. Communication becomes difficult and requires great effort. Assistance to reach the emergency department may be required. Facial flushing and profuse sweating along with potentially dangerous increases in heart rate and blood pressure will be evident.   Distressing pain 7 Self-care is very difficult. Assistance is required to transport, or use restroom. Assistance to reach the emergency department will be required. Tasks requiring coordination, such as bathing and getting dressed become very difficult.   Disabling pain 8 Self-care is no longer possible. At this level, pain is disabling. The individual is unable to do even the most "basic" activities such as walking, eating, bathing, dressing, transferring to a bed, or toileting. Fine motor skills are lost. It is difficult to think clearly.   Incapacitating pain 9 Pain becomes incapacitating. Thought processing is no longer possible. Difficult to remember your own name. Control of movement and coordination are lost.   The worst pain imaginable 10 At this level, most patients pass out from pain. When this level is reached, collapse of the autonomic nervous system occurs, leading to a sudden drop in blood pressure and heart rate. This in turn results in a temporary and dramatic drop in blood flow to the brain, leading to a loss of consciousness. Fainting is one of the body's self defense mechanisms. Passing out puts the brain in a calmed state and causes it to shut down for a while, in order to begin the healing process.    Summary: 1. Refer to this scale when providing [place] with your pain level. 2. Be  accurate and careful when reporting your pain level. This will help with your care. 3. Over-reporting your pain level will lead to loss of credibility. 4. Even a level of 1/10 means that there is pain and will be treated at our facility. 5. High, inaccurate reporting will be documented as "Symptom Exaggeration", leading to loss of credibility and suspicions of possible secondary gains such as obtaining more narcotics, or wanting to appear disabled, for fraudulent reasons. 6. Only pain levels of 5 or below will be seen at our facility. 7. Pain levels of 6 and above will be sent to the Emergency Department and the appointment cancelled.  Radiofrequency Lesioning Radiofrequency lesioning is a procedure that is performed to relieve pain. The procedure is often used for back, neck, or arm pain. Radiofrequency lesioning involves the use of a machine that creates radio waves to make heat. During the procedure, the heat is applied to the nerve that carries the pain signal. The heat damages the nerve and interferes with the pain signal. Pain relief usually starts about 2 weeks after the procedure and lasts for 6 months to 1 year. Tell a health care provider about:  Any allergies you have.  All medicines you are taking, including vitamins, herbs, eye drops, creams, and over-the-counter medicines.  Any problems you or family members have had with anesthetic medicines.  Any blood disorders you have.  Any surgeries you have had.  Any medical conditions you have.  Whether you are pregnant or may be pregnant. What are the  risks? Generally, this is a safe procedure. However, problems may occur, including:  Pain or soreness at the injection site.  Infection at the injection site.  Damage to nerves or blood vessels.  What happens before the procedure?  Ask your health care provider about: ? Changing or stopping your regular medicines. This is especially important if you are taking diabetes  medicines or blood thinners. ? Taking medicines such as aspirin and ibuprofen. These medicines can thin your blood. Do not take these medicines before your procedure if your health care provider instructs you not to.  Follow instructions from your health care provider about eating or drinking restrictions.  Plan to have someone take you home after the procedure.  If you go home right after the procedure, plan to have someone with you for 24 hours. What happens during the procedure?  You will be given one or more of the following: ? A medicine to help you relax (sedative). ? A medicine to numb the area (local anesthetic).  You will be awake during the procedure. You will need to be able to talk with the health care provider during the procedure.  With the help of a type of X-ray (fluoroscopy), the health care provider will insert a radiofrequency needle into the area to be treated.  Next, a wire that carries the radio waves (electrode) will be put through the radiofrequency needle. An electrical pulse will be sent through the electrode to verify the correct nerve. You will feel a tingling sensation, and you may have muscle twitching.  Then, the tissue that is around the needle tip will be heated by an electric current that is passed using the radiofrequency machine. This will numb the nerves.  A bandage (dressing) will be put on the insertion area after the procedure is done. The procedure may vary among health care providers and hospitals. What happens after the procedure?  Your blood pressure, heart rate, breathing rate, and blood oxygen level will be monitored often until the medicines you were given have worn off.  Return to your normal activities as directed by your health care provider. This information is not intended to replace advice given to you by your health care provider. Make sure you discuss any questions you have with your health care provider. Document Released:  08/18/2010 Document Revised: 05/28/2015 Document Reviewed: 01/27/2014 [employer] Interactive Patient Education  [employer].  ____________________________________________________________________________________________

## 2016-06-23 NOTE — Progress Notes (Deleted)
Patient's Name: Lori Mckenzie  MRN: [id number]  Referring Provider: Ardine Eng, MD  DOB: 10/23/1965  PCP: Ardine Eng, MD  DOS: 06/23/2016  Note by: Vevelyn Francois NP  Service setting: Ambulatory outpatient  Specialty: Interventional Pain Management  Location: [facility] (AMB) Pain Management Facility    Patient type: Established    Primary Reason(s) for Visit: {Blank single:19197::"Evaluation of an undiagnosed new problem with uncertain prognosis (Level of risk: moderate)","Encounter for adjustment & management of implanted neuromodulation pain management device","Encounter for evaluation before starting prescription medication management (Level of risk: moderate)","Encounter for pre-operative evaluation","Encounter to go over recently ordered test(s) and evaluations","Evaluation of chronic illnesses with exacerbation, or progression (Level of risk: moderate)"} CC: Neck Pain and Back Pain (middle and lower)  HPI  Lori Mckenzie is a 51 y.o. year old, female patient, who comes today for a follow-up evaluation. She has Type 2 diabetes mellitus ([place]); COPD (chronic obstructive pulmonary disease) (HCC); GERD (gastroesophageal reflux disease); OSA on CPAP; Anxiety; Steatohepatitis; Dysthymia; Other social stressor; Ascites; NASH (nonalcoholic steatohepatitis); Hypokalemia; Opiate use (75 MME/Day); Long term prescription opiate use; Long term current use of opiate analgesic; Encounter for therapeutic drug level monitoring; Chronic epigastric abdominal pain; Chronic low back pain (Location of Primary Source of Pain) (Bilateral) (R>L); Chronic neck pain (Location of Tertiary source of pain) (Bilateral) (R>L); Breath shortness; Diastolic dysfunction; Clinical depression; Airway hyperreactivity; Essential (primary) hypertension; 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, L1-2, & L4-5); Lumbar and sacral osteoarthritis; Myofascial pain; Drowsiness; Episode of syncope; Subacute lumbar radiculopathy (left side) (S1 dermatome);  Vitamin D deficiency; B12 deficiency; Folate deficiency; Thoracic radiculitis; Elevated sedimentation rate; Elevated C-reactive protein (CRP); Lumbar facet syndrome (Location of Primary Source of Pain) (Bilateral) (R>L); Cervical spondylosis; Chronic feet pain (Location of Secondary source of pain) (Bilateral) (R>L); Lumbar spondylosis; Encounter for chronic pain management; Chronic shoulder pain (Bilateral) (R>L); Chronic carpal tunnel syndrome (Bilateral); Chronic hip pain (Bilateral) (L>R); Chronic upper back pain (Bilateral) (L>R); Osteoporosis, idiopathic; Abnormal MRI, lumbar spine (02/03/2015); Hepatic encephalopathy ([place]); Radicular pain of thoracic region; Altered mental status; Cirrhosis of liver without ascites ([place]); Elevated liver enzymes; Opiate withdrawal ([place]); Chronic pain syndrome; Asthma; Lumbar spinal stenosis; Lumbosacral spondylosis without myelopathy; Neuromyositis; Somnolence; Spinal stenosis, lumbar region without neurogenic claudication (L1-2 and L4-5); Spondylosis of lumbar region without myelopathy or radiculopathy; Allodynia; Osteoarthritis; Chronic pain of left upper extremity; Chronic radicular cervical pain (L); Chronic sacroiliac joint pain (Bilateral) (L>R); Diabetes mellitus, insulin dependent (IDDM), uncontrolled ([place]); Chronic pain of lower extremity, bilateral; Spinal stenosis, thoracic region (T10-11); Nausea without vomiting; Cirrhosis ([place]); Constipation; Hyperglycemia; and Uncontrolled diabetes mellitus ([place]) on her problem list. Lori Mckenzie was last seen on Visit date not found. Her primarily concern today is the Neck Pain and Back Pain (middle and lower)  Pain Assessment: Self-Reported Pain Score: 4 /10 {Blank single:19197::"Clinically the patient looks like a","     "} {Blank single:19197::"0/10","1/10","2/10","3/10","4/10","5/10","     "} {Blank single:19197::"Reported level is inconsistent with clinical observations.","Clear symptom exaggeration. Reported level of  pain is not compatible with clinical observations.","Reported level is compatible with observation."} {Blank single:19197::"Information on the proper use of the pain scale provided to the patient today","Exaggerated score may be due to the reporting of a "suffering" component","Score may indicate symptom exaggeration","     "} Pain Type: Chronic pain Pain Location: Neck Pain Orientation: Left Pain Descriptors / Indicators: Burning (jabbing, like a crick) Pain Frequency: Constant  Further details on both, my assessment(s), as well as the proposed treatment plan, please see below.  Laboratory Chemistry  Inflammation Markers Lab Results  Component Value Date   CRP <0.8 04/12/2016   ESRSEDRATE 4 04/12/2016   (CRP: Acute Phase) (ESR: Chronic Phase) Renal Function Markers Lab Results  Component Value Date   BUN 17 06/13/2016   CREATININE 0.85 06/13/2016   GFRAA >60 06/13/2016   GFRNONAA >60 06/13/2016   Hepatic Function Markers Lab Results  Component Value Date   AST 65 (H) 08/19/2015   ALT 82 (H) 08/19/2015   ALBUMIN 4.8 08/19/2015   ALKPHOS 116 08/19/2015   Electrolytes Lab Results  Component Value Date   NA 134 (L) 06/13/2016   K 4.4 06/13/2016   CL 98 (L) 06/13/2016   CALCIUM 9.5 06/13/2016   MG 1.9 01/08/2015   Neuropathy Markers Lab Results  Component Value Date   VITAMINB12 272 04/12/2016   Bone Pathology Markers Lab Results  Component Value Date   ALKPHOS 116 08/19/2015   VD25OH 14.3 (L) 01/08/2015   VD125OH2TOT 28.8 01/08/2015   25OHVITD1 36 04/12/2016   25OHVITD2 3.9 04/12/2016   25OHVITD3 32 04/12/2016   CALCIUM 9.5 06/13/2016   Coagulation Parameters Lab Results  Component Value Date   INR 1.18 12/01/2015   LABPROT 15.1 12/01/2015   APTT 24.6 08/26/2013   PLT 111 (L) 06/13/2016   Cardiovascular Markers Lab Results  Component Value Date   BNP 11 12/11/2012   HGB 16.6 (H) 06/13/2016   HCT 46.4 06/13/2016   Note: {Blank single:19197::"No  results found under the [employer] electronic medical record","Results made available to patient.","Lab results reviewed and made available to patient.","Lab results reviewed and explained to patient in Layman's terms.","Lab results reviewed."}  Recent Diagnostic Imaging Review  No results found. Cervical Imaging: Cervical MR wo contrast:  Cervical MR wo contrast:  Cervical MR w/wo contrast:  Cervical MR w contrast:  Cervical CT wo contrast:  Cervical CT w/wo contrast:  Cervical CT w/wo contrast:  Cervical CT w contrast:  Cervical CT outside:  Cervical DG 1 view:  Cervical DG 2-3 views:  Cervical DG F/E views:  Cervical DG 2-3 clearing views:  Cervical DG Bending/F/E views:  Cervical DG complete:  Cervical DG Myelogram views:  Cervical DG Myelogram views:  Cervical Discogram views:   Shoulder Imaging: Shoulder-R MR w contrast:  Shoulder-L MR w contrast:  Shoulder-R MR w/wo contrast:  Shoulder-L MR w/wo contrast:  Shoulder-R MR wo contrast:  Shoulder-L MR wo contrast:  Shoulder-R CT w contrast:  Shoulder-L CT w contrast:  Shoulder-R CT w/wo contrast:  Shoulder-L CT w/wo contrast:  Shoulder-R CT wo contrast:  Shoulder-L CT wo contrast:  Automotive engineer DG Arthrogram:  Shoulder-L DG Arthrogram:  Shoulder-R DG 1 view:  Shoulder-L DG 1 view:  Shoulder-R DG:  Results for orders placed during the hospital encounter of 08/25/14  DG Shoulder Right   Narrative CLINICAL DATA:  Right shoulder pain.  EXAM: RIGHT SHOULDER - 2+ VIEW  COMPARISON:  None.  FINDINGS: There is no evidence of fracture or dislocation. There is no evidence of arthropathy or other focal bone abnormality. Soft tissues are unremarkable.  IMPRESSION: Negative.   Electronically Signed   By: Kerby Moors M.D.   On: 08/26/2014 17:40    Shoulder-L DG:   Thoracic Imaging: Thoracic MR wo contrast:  Results for orders placed during the hospital encounter of 03/06/15  MR Thoracic Spine Wo  Contrast   Narrative CLINICAL DATA:  Diffuse mid back pain with bilateral arm and leg pain since 2008.  EXAM: MRI THORACIC SPINE WITHOUT CONTRAST  TECHNIQUE: Multiplanar, multisequence MR imaging of the thoracic spine was performed. No intravenous contrast was administered.  COMPARISON:  08/06/2014  FINDINGS: Small hemangiomas noted in the T3 and T11 vertebra. There is increased T2 signal in the right transverse process of T6, probably with increased T1 signal on image 3 series 5, and with trabecular coarsening in this vicinity on prior exams including the chest CT from 08/05/2007, favoring hemangioma.  Several scattered tiny T2 hyperintense lesions are present in the liver and are nonspecific.  No significant abnormal spinal cord signal is observed. There is mild dextroconvex thoracic scoliosis but no malalignment.  Several small Schmorl's nodes are present in the lower thoracic spine.  Additional findings at individual levels are as follows:  T1-2:  No impingement.  Central disc protrusion.  T2-3: Moderate right foraminal stenosis due to right facet arthropathy. Central disc protrusion.  T3-4:  Mild right foraminal stenosis due to facet arthropathy.  T4-5:  Mild right foraminal stenosis due to facet arthropathy.  T5-6: Mild left eccentric central narrowing of the thecal sac due to left facet arthropathy, image 17 series 7.  T6-7: Mild left foraminal stenosis due to intervertebral spurring. Disc bulge.  T7-8: Borderline central narrowing of the thecal sac due to focal right paracentral spurring.  T8-9:  Mild bilateral foraminal stenosis due to facet arthropathy.  T9-10:  Borderline left foraminal stenosis due to facet arthropathy.  T10-11: Moderate central narrowing of the thecal sac due to a central disc protrusion with associated spurring. Mild left foraminal stenosis due to facet arthropathy.  T11-12: Mild left foraminal stenosis due to left paracentral  disc protrusion and facet spurring.  T12-L1:  No impingement.  Right paracentral disc protrusion.  L1-2: No impingement. Central disc protrusion. This level is only included on the parasagittal images.  IMPRESSION: 1. Spondylosis and degenerative disc disease in the thoracic spine, causing moderate impingement at T2-3 and T10-11 ; and mild impingement at T3-4, T4-5, T5-6, T6-7, T8-9, and T11-12, as detailed above. 2. Several vertebral hemangiomas, the largest of which is in the right T6 transverse process. 3. Scattered tiny T2 hyperintense liver lesions are probably cysts and have been seen on prior CT scans, but technically nonspecific on today's MRI.   Electronically Signed   By: Van Clines M.D.   On: 03/06/2015 11:52    Thoracic MR wo contrast:  Thoracic MR w/wo contrast:  Thoracic MR w contrast:  Thoracic CT wo contrast:  Thoracic CT w/wo contrast:  Thoracic CT w/wo contrast:  Thoracic CT w contrast:  Thoracic DG 2-3 views:  Thoracic DG 4 views:  Thoracic DG:  Thoracic DG w/swimmers view:  Thoracic DG Myelogram views:  Thoracic DG Myelogram views:   Lumbosacral Imaging: Lumbar MR wo contrast:  Results for orders placed during the hospital encounter of 02/03/15  MR Lumbar Spine Wo Contrast   Narrative CLINICAL DATA:  51 year old female with right side lumbar back pain for 2 weeks, progressively increasing. Left S1 radicular pain. Initial encounter. Remote fall in 2008.  EXAM: MRI LUMBAR SPINE WITHOUT CONTRAST  TECHNIQUE: Multiplanar, multisequence MR imaging of the lumbar spine was performed. No intravenous contrast was administered.  COMPARISON:  CT Abdomen and Pelvis 01/29/2015 and earlier  FINDINGS: Normal lumbar segmentation demonstrated on the comparison. Straightening of lumbar lordosis, otherwise normal vertebral height and alignment. Bone marrow signal within normal limits. No marrow edema or evidence of acute osseous abnormality. Visible  sacrum intact.  No signal abnormality in the visualized lower thoracic spinal cord. Conus medullaris  appears normal at L1-L2.  Decreased T2 signal in the visible liver and spleen. Stable visualized abdominal viscera, small retroperitoneal lymph nodes. Negative visualized posterior paraspinal soft tissues.  T10-T11: Bulky but incompletely visualized central disc extrusion suggested at T10-T11 (series 2, image 8). This disc herniation is seen to be partially calcified on the recent CT Abdomen and Pelvis comparison. Those images suggest moderate associated lower thoracic spinal stenosis.  T11-T12: Small central disc protrusion. Mild to moderate left facet hypertrophy. Moderate left T11 foraminal stenosis.  T12-L1: Right paracentral disc protrusion with no significant stenosis.  L1-L2: Left paracentral and caudal disc extrusion (series 5, image 10) is mild. Borderline to mild spinal stenosis at this level without foraminal involvement.  L2-L3:  Minimal disc bulge.  Mild facet hypertrophy.  No stenosis.  L3-L4:  Mild disc bulge.  Moderate facet hypertrophy.  No stenosis.  L4-L5: Mild circumferential disc bulge. Moderate to severe facet hypertrophy. Borderline to mild spinal stenosis (series 5, image 29). No convincing lateral recess or foraminal stenosis.  L5-S1:  Mild disc bulge and facet hypertrophy.  No stenosis.  IMPRESSION: 1. Disc degeneration maximal in the lower thoracic and upper lumbar spine. Suspect up to moderate lower thoracic spinal stenosis at T10-T11 related to a partially calcified disc herniation which is partially visible on this study. Follow-up thoracic spine MRI would evaluate further as necessary. 2. Mild multifactorial lumbar spinal stenosis at L1-L2 and L4-L5. 3. Moderate left T11 neural foraminal stenosis. No lumbar foraminal stenosis. 4.  No acute osseous abnormality in the lumbar spine.   Electronically Signed   By: Genevie Ann M.D.   On:  02/03/2015 11:42    Lumbar MR wo contrast:  Results for orders placed in visit on 02/28/10  [place] W/O Cm   Narrative **** PRIOR REPORT IMPORTED FROM AN EXTERNAL SYSTEM ****   PRIOR REPORT IMPORTED FROM THE [facility] WORKFLOW SYSTEM   REASON FOR EXAM:    low back pain  COMMENTS:   PROCEDURE:     MR  - MR LUMBAR SPINE WO CONTRAST  - Feb 28 2010  5:14PM   RESULT:     MRI LUMBAR SPINE WITHOUT CONTRAST   HISTORY: Low back pain   COMPARISON: None   TECHNIQUE: Multiplanar and multisequence MRI of the lumbar spine were  obtained, without administration of IV contrast.   FINDINGS:   The vertebral bodies of the lumbar spine are normal in size and alignment.  There is normal bone marrow signal demonstrated throughout the vertebra.  The  intervertebral disc spaces are well-maintained.   The spinal cord is of normal volume and contour. The cord terminates  normally at L1 . The nerve roots of the cauda equina and the filum  terminale  have the usual appearance.   The visualized portions of the SI joints are unremarkable.   The imaged intra-abdominal contents are unremarkable.   T10-T11: There is a small central disc protrusion abutting the ventral  thoracic spinal cord.   T12-L1: Small right paracentral disc protrusion. No evidence of neural  foraminal or central stenosis.   L1-L2: Small-moderate central disc protrusion with mild mass effect upon  the  ventral thecal sac. No evidence of neural foraminal or central stenosis.   L2-L3: No significant disc bulge. No evidence of neural foraminal or  central  stenosis. Mild bilateral facet arthropathy.   L3-L4: No significant disc bulge. No evidence of neural foraminal or  central  stenosis. Moderate bilateral facet arthropathy.   L4-L5:  Mild broad-based disc bulge. Severe bilateral facet arthropathy.  Mild-moderate spinal stenosis. No evidence of neural foraminal stenosis.   L5-S1: No significant disc bulge. No evidence  of neural foraminal or  central  stenosis. Mild bilateral facet arthropathy.   IMPRESSION:   1. Lumbar spine spondylosis as described above.       Lumbar MR w/wo contrast:  Lumbar MR w contrast:  Lumbar CT wo contrast:  Lumbar CT w/wo contrast:  Lumbar CT w/wo contrast:  Lumbar CT w contrast:  Lumbar DG 1V:  Lumbar DG 1V (Clearing):  Lumbar DG 2-3V (Clearing):  Lumbar DG 2-3 views:  Results for orders placed in visit on 08/27/09  DG Lumbar Spine 2-3 Views   Narrative **** PRIOR REPORT IMPORTED FROM AN EXTERNAL SYSTEM ****   PRIOR REPORT IMPORTED FROM THE [facility] WORKFLOW SYSTEM   REASON FOR EXAM:    trauma  COMMENTS:   PROCEDURE:     DXR - DXR LUMBAR SPINE AP AND LATERAL  - Aug 27 2009   1:30PM   RESULT:     Comparison: None   Findings:   AP and lateral views of the lumbar spine and a coned down view of the  lumbosacral junction are provided.   There are 5 nonrib bearing lumbar-type vertebral bodies. The vertebral  body  heights are maintained. The alignment is anatomic. There is no  spondylolysis. There is no acute fracture or static listhesis. The disc  spaces are maintained.   The SI joints are unremarkable.   IMPRESSION:   1. No acute osseous abnormality of the lumbar spine.       Lumbar DG (Complete) 4+V:  Lumbar DG F/E views:  Lumbar DG Bending views:  Lumbar DG Myelogram views:  Lumbar DG Myelogram:  Lumbar DG Myelogram:  Lumbar DG Myelogram:  Lumbar DG Myelogram Lumbosacral:  Lumbar DG Diskogram views:  Lumbar DG Diskogram views:  Lumbar DG Epidurogram OP:  Lumbar DG Epidurogram IP:   Sacroiliac Joint Imaging: Sacroiliac Joint DG:  Sacroiliac Joint MR w/wo contrast:  Sacroiliac Joint MR wo contrast:   Spine Imaging: Whole Spine DG Myelogram views:  Whole Spine MR Mets screen:  Whole Spine MR Mets screen:  Whole Spine MR w/wo:  MRA Spinal Canal w/ cm:  MRA Spinal Canal wo/ cm:  MRA Spinal Canal w/wo cm:  Spine Outside MR Films:   Spine Outside CT Films:  CT-Guided Biopsy:  CT-Guided Needle Placement:  DG Spine outside:  IR Spine outside:  NM Spine outside:  Epidurography 1:  Epidurography 2:   Hip Imaging: Hip-R MR w contrast:  Hip-L MR w contrast:  Hip-R MR w/wo contrast:  Hip-L MR w/wo contrast:  Hip-R MR wo contrast:  Hip-L MR wo contrast:  Hip-R CT w contrast:  Hip-L CT w contrast:  Hip-R CT w/wo contrast:  Hip-L CT w/wo contrast:  Hip-R CT wo contrast:  Hip-L CT wo contrast:  Hip-R DG 2-3 views:  Results for orders placed during the hospital encounter of 04/21/16  DG HIP UNILAT W OR W/O PELVIS 2-3 VIEWS RIGHT   Narrative CLINICAL DATA:  Chronic bilateral hip pain for at least the past 10 years. No known injury  EXAM: DG HIP (WITH OR WITHOUT PELVIS) 2-3V RIGHT  COMPARISON:  CT scan the abdomen pelvis of June 24, 2015  FINDINGS: The right hemipelvis is subjectively adequately mineralized. No lytic or blastic bony lesion is observed. AP and lateral views of the right hip reveal preservation of the joint space. The articular  surfaces of the femoral head and acetabulum remains smoothly rounded. The femoral neck, intertrochanteric, and subtrochanteric regions are normal.  IMPRESSION: There is no acute or significant chronic bony abnormality of the right hip.   Electronically Signed   By: David  Martinique M.D.   On: 04/21/2016 15:00    Hip-L DG 2-3 views:  Results for orders placed during the hospital encounter of 04/21/16  DG HIP UNILAT W OR W/O PELVIS 2-3 VIEWS LEFT   Narrative CLINICAL DATA:  Bilateral hip pain and limited range of motion for the past 10 years. No known injury.  EXAM: DG HIP (WITH OR WITHOUT PELVIS) 2-3V LEFT  COMPARISON:  Abdomen pelvis CT dated 06/24/2015.  FINDINGS: Curvilinear metallic densities in the left pelvis and lower abdomen, corresponding to the inferior epigastric artery embolization coils on the previous CT. The visualized bowel gas pattern is  normal. Bilateral pelvic phleboliths. Unremarkable bones.  IMPRESSION: No acute abnormality.   Electronically Signed   By: Claudie Revering M.D.   On: 04/21/2016 15:00    Hip-R DG Arthrogram:  Hip-L DG Arthrogram:  Hip-B DG Bilateral:   Knee Imaging: Knee-R MR w contrast:  Knee-L MR w contrast:  Knee-R MR w/wo contrast:  Knee-L MR w/wo contrast:  Knee-R MR wo contrast:  Knee-L MR wo contrast:  Knee-R CT w contrast:  Knee-L CT w contrast:  Knee-R CT w/wo contrast:  Knee-L CT w/wo contrast:  Knee-R CT wo contrast:  Knee-L CT wo contrast:  Knee-R DG 1-2 views:  Knee-L DG 1-2 views:  Knee-R DG 3 views:  Knee-L DG 3 views:  Knee-R DG 4 views:  Knee-L DG 4 views:  Knee-R DG Arthrogram:  Knee-L DG Arthrogram:   Note: {Blank single:19197::"No new results found.","No results found under the [employer] electronic medical record.","Imaging results reviewed.","Imaging results reviewed and explained to patient in Layman's terms.","Results of ordered imaging test(s) reviewed and explained to patient in Layman's terms."} {Blank single:19197::"       ","Results made available to patient","Copy of results provided to patient"}  Meds  The patient has a current medication list which includes the following prescription(s): albuterol, alprazolam, cholecalciferol, citalopram, diclofenac sodium, dicyclomine, fluticasone, intrinsi b12-folate, furosemide, gabapentin, insulin aspart, insulin detemir, ipratropium, lactulose, naloxone, nystatin, pantoprazole, potassium chloride sa, promethazine, rifaximin, rizatriptan, spironolactone, and vitamin b-12.  Current Outpatient Prescriptions on File Prior to Visit  Medication Sig  . albuterol (ACCUNEB) 0.63 MG/3ML nebulizer solution Inhale 1 ampule by nebulization every six (6) hours as needed for wheezing.  Marland Kitchen ALPRAZolam (XANAX) 1 MG tablet Take 1-2 mg by mouth 3 (three) times daily as needed for anxiety.  . Cholecalciferol (VITAMIN D-1000 MAX ST) 1000  units tablet Take 2,000 Units by mouth daily.   . citalopram (CELEXA) 20 MG tablet Take 20 mg by mouth daily.   . diclofenac sodium (VOLTAREN) 1 % GEL Apply 4 g topically 4 (four) times daily.  Marland Kitchen dicyclomine (BENTYL) 10 MG capsule Take 10 mg by mouth 4 (four) times daily -  before meals and at bedtime.   . fluticasone (FLONASE) 50 MCG/ACT nasal spray Place 1-2 sprays into both nostrils daily as needed for rhinitis.   Derald Macleod Factor (INTRINSI B12-FOLATE) 628-366-29 MCG-MCG-MG TABS Take 1 tablet by mouth daily. Reported on 04/06/2015  . furosemide (LASIX) 40 MG tablet Take 80 mg by mouth as needed.   . gabapentin (NEURONTIN) 300 MG capsule Take 900 mg by mouth 4 (four) times daily as needed (for pain).   . insulin aspart (NOVOLOG)  100 UNIT/ML injection 20 units with meals 3 times daily plus sliding scale.  MDD 80 units.  . Insulin Detemir (LEVEMIR FLEXPEN [place]) Inject 50 Units into the skin 2 (two) times daily.   Marland Kitchen ipratropium (ATROVENT) 0.02 % nebulizer solution Inhale 500 mcg by nebulization Four (4) times a day.  . lactulose (CHRONULAC) 10 GM/15ML solution Take 30 g by mouth 3 (three) times daily as needed.   . naloxone [facility]) 2 MG/2ML injection Inject into the vein as needed.  . nystatin (MYCOSTATIN) powder Apply 1 g topically 3 (three) times daily as needed (for irritation).   . pantoprazole (PROTONIX) 40 MG tablet Take 40 mg by mouth daily.   . potassium chloride SA (K-DUR,KLOR-CON) 20 MEQ tablet Take 20 mEq by mouth as needed.  . promethazine (PHENERGAN) 12.5 MG tablet Take 12.5 mg by mouth every 6 (six) hours as needed for nausea or vomiting.   . rifaximin (XIFAXAN) 550 MG TABS tablet Take 550 mg by mouth 2 (two) times daily.   . rizatriptan (MAXALT) 10 MG tablet Take 10 mg by mouth as needed for migraine.   Marland Kitchen spironolactone (ALDACTONE) 100 MG tablet Take 100 mg by mouth daily.   . vitamin B-12 (CYANOCOBALAMIN) 1000 MCG tablet Take 2,000 mcg by mouth daily.  . [DISCONTINUED]  SUMAtriptan (IMITREX) 50 MG tablet Take 50 mg by mouth every 2 (two) hours as needed. For migraines   No current facility-administered medications on file prior to visit.    ROS  Constitutional: {Blank single:19197::"Denies any fever or chills"} Gastrointestinal: {Blank single:19197::"No reported hemesis, hematochezia, vomiting, or acute GI distress"} Musculoskeletal: {Blank single:19197::"Denies any acute onset joint swelling, redness, loss of ROM, or weakness"} Neurological: {Blank single:19197::"No reported episodes of acute onset apraxia, aphasia, dysarthria, agnosia, amnesia, paralysis, loss of coordination, or loss of consciousness"}  Allergies  Lori Mckenzie is allergic to tape and vicodin [hydrocodone-acetaminophen].  PFSH  Drug: Lori Mckenzie  reports that she does not use drugs. Alcohol:  reports that she does not drink alcohol. Tobacco:  reports that she has never smoked. She has never used smokeless tobacco. Medical:  has a past medical history of Abdominal abscess (08/25/2014); Acid reflux (08/10/2010); Acute cervical myofascial strain (02/09/2016); Anxiety; Ascites; Asthma; Back pain; Bile leak, postoperative (07/17/2014); Brittle bone disease; Cancer ([place]); Cervical disc disease; Chronic kidney disease; Collagen vascular disease ([place]); COPD (chronic obstructive pulmonary disease) ([place]); Diabetes mellitus without complication ([place]); GERD (gastroesophageal reflux disease); Hypertension; Hypothyroidism; Left upper quadrant pain (01/09/2014); Major depressive disorder with single episode (12/05/2011); Major depressive disorder, single episode (12/05/2011); Migraines; NASH (nonalcoholic steatohepatitis); Respiratory infection; Shock ([place]) (09/18/2014); Sleep apnea; Sleep apnea; Syncope (11/16/2014); Thyroid disease; and TIA (transient ischemic attack). Family: family history includes Heart disease in her brother and sister; Lung cancer in her father; Ulcers in her father and sister.  Past Surgical  History:  Procedure Laterality Date  . ABDOMINAL HYSTERECTOMY    . CHOLECYSTECTOMY N/A 07/15/2014   Procedure: LAPAROSCOPIC CHOLECYSTECTOMY with liver biopsy ;  Surgeon: Sherri Rad, MD;  Location: [facility] ORS;  Service: General;  Laterality: N/A;  . COLONOSCOPY WITH PROPOFOL N/A 06/23/2014   Procedure: COLONOSCOPY WITH PROPOFOL;  Surgeon: Lollie Sails, MD;  Location: [facility] ENDOSCOPY;  Service: Endoscopy;  Laterality: N/A;  . ERCP N/A 07/16/2014   Procedure: ENDOSCOPIC RETROGRADE CHOLANGIOPANCREATOGRAPHY (ERCP);  Surgeon: Clarene Essex, MD;  Location: [facility] ENDOSCOPY;  Service: Endoscopy;  Laterality: N/A;  . ERCP N/A 10/03/2014   Procedure: ENDOSCOPIC RETROGRADE CHOLANGIOPANCREATOGRAPHY (ERCP);  Surgeon: Hulen Luster, MD;  Location: [facility] ENDOSCOPY;  Service: Gastroenterology;  Laterality: N/A;  . ESOPHAGOGASTRODUODENOSCOPY N/A 06/23/2014   Procedure: ESOPHAGOGASTRODUODENOSCOPY (EGD);  Surgeon: Lollie Sails, MD;  Location[facility] ENDOSCOPY;  Service: Endoscopy;  Laterality: N/A;  . ESOPHAGOGASTRODUODENOSCOPY (EGD) WITH PROPOFOL N/A 12/01/2015   Procedure: ESOPHAGOGASTRODUODENOSCOPY (EGD) WITH PROPOFOL;  Surgeon: Lollie Sails, MD;  Location: [facility] ENDOSCOPY;  Service: Endoscopy;  Laterality: N/A;  . TUBAL LIGATION    . WISDOM TOOTH EXTRACTION     Constitutional Exam  General appearance: {general exam:210120802::"Well nourished, well developed, and well hydrated. In no apparent acute distress"} Vitals:   06/23/16 0858  BP: 128/74  Pulse: 97  Resp: 20  Temp: 97.9 F (36.6 C)  TempSrc: Oral  SpO2: 96%  Weight: 260 lb (117.9 kg)  Height: 5' 2"  (1.575 m)   BMI Assessment: Estimated body mass index is 47.55 kg/m as calculated from the following:   Height as of this encounter: 5' 2"  (1.575 m).   Weight as of this encounter: 260 lb (117.9 kg).  BMI interpretation table: BMI level Category Range association with higher incidence of chronic pain  <18 kg/m2 Underweight   18.5-24.9 kg/m2 Ideal  body weight   25-29.9 kg/m2 Overweight Increased incidence by 20%  30-34.9 kg/m2 Obese (Class I) Increased incidence by 68%  35-39.9 kg/m2 Severe obesity (Class II) Increased incidence by 136%  >40 kg/m2 Extreme obesity (Class III) Increased incidence by 254%   BMI Readings from Last 4 Encounters:  06/23/16 47.55 kg/m  06/13/16 45.91 kg/m  06/06/16 47.55 kg/m  05/19/16 47.55 kg/m   Wt Readings from Last 4 Encounters:  06/23/16 260 lb (117.9 kg)  06/13/16 251 lb (113.9 kg)  06/06/16 260 lb (117.9 kg)  05/19/16 260 lb (117.9 kg)  Psych/Mental status: {Blank single:19197::"Alert and oriented x 3. Exaggerated physical and/or psychosocial pain behavior perceived.","Alert, oriented x 3 (person, place, & time)"} {Blank single:19197::"Lori Mckenzie's speech pattern and demeanor seems to suggest oversedation","     "} Eyes: {Blank single:19197::"Miotic (pupilary constriction) due to opiate use","Midriatic","Anisocoric","Evidence of ptosis","Pin-point pupils","PERLA"} Respiratory: {Blank single:19197::"Oxygen-dependent COPD","No evidence of acute respiratory distress"}  Cervical Spine Exam  Inspection: {Blank single:19197::"Well healed scar from previous spine surgery detected","Paravertebral muscle atrophy","No masses, redness, or swelling"} Alignment: {Blank single:19197::"Asymmetric","Symmetrical"} Functional ROM: {Blank single:19197::"Improved after treatment","Adequate ROM","Decreased ROM","Diminished ROM","Full ROM","Fused","Grossly intact ROM","Guarding","Limited ROM","Mechanically restricted ROM","Minimal ROM","Pain restricted ROM","Restricted ROM","Zero ROM","ROM appears unrestricted","ROM is within functional limits (WFL)","ROM is within normal limits (WNL)","Unrestricted ROM"}{Blank single:19197::", to the right",", to the left",", bilaterally","     "} Stability: {Blank single:19197::"Possibly unstable","No instability detected"} Muscle strength & Tone: {Blank single:19197::"Inconsistent  level of performance when tested","Guarding observed","Functionally intact"} Sensory: {Blank single:19197::"Improved","Movement-associated pain","Movement-associated discomfort","Impaired sensorium","Articular pain pattern","Arthropathic arthralgia","Dermatomal pain pattern","Musculoskeletal pain pattern","Myotome pain pattern","Neurogenic pain pattern","Neuropathic pain pattern","Referred pain pattern","Visceral pain pattern","Allodynia (Painful response to non-painful stimuli)","Anesthesia (Absence of sensation)","Anesthesia Dolorosa (Numbness over painful area)","Dysesthesias (Unpleasant sensation to touch)","Hyperalgesia (Increased sensitivity to pain)","Hyperesthesia (Increased sensitivity to touch)","Hyperpathia (Painful, exaggerated response to nociceptive stimuli)","Hypoalgesia (Decreased sensitivity to painful stimuli)","Hypoesthesia/Hypesthesia (Reduced sensation to touch)","Paresthesia (Burning sensation)","Paresthesia (Tingling sensation)","WNL","No anomaly detected","Unimpaired"} Palpation: {Blank single:19197::"Tender","Complains of area being tender to palpation","Uncomfortable","Positive","Negative","Increased muscle tone","Trigger Point","Muscular Atrophy","Non-tender","No complaints of tenderness","Hyperthermic","Hypothermic","Euthermic","No palpable anomalies"} {Blank single:19197::"Cervical compression test","Tinel's test","Trousseau's test (3 minute blood pressure cuff test) for hypocalcemia","     "} {Blank single:19197::"for right occipital neuralgia","for left occipital neuralgia","for bilateral occipital neuralgia","for cervical facet disease","     "}  Upper Extremity (UE) Exam    Side: Right upper extremity  Side: Left upper extremity  Inspection: {Blank single:19197::"Evidence of prior arthroplastic surgery","Below elbow  amputation (BEA)","Above elbow amputation (AEA)","Contracture","Atrophy","Dystrophy","Degenerative arthropathy with ulnar deviation","Degenerative deforming  arthropathy","Heberden's nodes (DIP)","Bouchard's nodes (PIP)","No gross anomalies detected","Edema","Positive color changes","Some redness observed","Increased temperature","Acrocyanosis","Normal skin color, temperature, and hair growth. No peripheral edema or cyanosis","No masses, redness, swelling, or asymmetry. No contractures"}  Inspection: {Blank single:19197::"Evidence of prior arthroplastic surgery","Below elbow amputation (BEA)","Above elbow amputation (AEA)","Contracture","Atrophy","Dystrophy","Degenerative arthropathy with ulnar deviation","Degenerative deforming arthropathy","Heberden's nodes (DIP)","Bouchard's nodes (PIP)","No gross anomalies detected","Edema","Positive color changes","Some redness observed","Increased temperature","Acrocyanosis","Normal skin color, temperature, and hair growth. No peripheral edema or cyanosis","No masses, redness, swelling, or asymmetry. No contractures"}  Functional ROM: {Blank single:19197::"Improved after treatment","Impaired ROM","Adequate ROM","Decreased ROM","Diminished ROM","Full ROM","Fused","Grossly intact ROM","Guarding","Limited ROM","Mechanically restricted ROM","Minimal ROM","Pain restricted ROM","Restricted ROM","Zero ROM","ROM appears unrestricted","ROM is within functional limits (WFL)","ROM is within normal limits (WNL)","Unrestricted ROM"} {Blank single:19197::"for shoulder","for elbow","for shoulder and elbow","for wrist","for wrist and hand","for hand","for all joints of upper extremity","       "}  Functional ROM: {Blank single:19197::"Improved after treatment","Impaired ROM","Adequate ROM","Decreased ROM","Diminished ROM","Full ROM","Fused","Grossly intact ROM","Guarding","Limited ROM","Mechanically restricted ROM","Minimal ROM","Pain restricted ROM","Restricted ROM","Zero ROM","ROM appears unrestricted","ROM is within functional limits (WFL)","ROM is within normal limits (WNL)","Unrestricted ROM"} {Blank single:19197::"for shoulder","for  elbow","for shoulder and elbow","for wrist","for wrist and hand","for hand","for all joints of upper extremity","       "}  Muscle strength & Tone: {Blank single:19197::"Inconsistent level of performance when tested","Normal strength (5/5)","Movement possible against some resistance (4/5)","Movement possible against gravity, but not against resistance (3/5)","Movement possible, but not against gravity (5/0)","DTOIZT flickering, but no movement (1/5)","No motor contraction (0/5)","Flaccid paralysis","Spastic paralysis","Cogwheel rigidity","Clasp-knife rigidity","Give-away weakness","Deconditioned","Mild-to-medarate deconditioning","Moderate-to-severe deconditioning","Guarding","WNL","Unremarkable","Grossly normal","Grossly intact","Functionally intact"}  Muscle strength & Tone: {Blank single:19197::"Inconsistent level of performance when tested","Normal strength (5/5)","Movement possible against some resistance (4/5)","Movement possible against gravity, but not against resistance (3/5)","Movement possible, but not against gravity (2/4)","PYKDXI flickering, but no movement (1/5)","No motor contraction (0/5)","Flaccid paralysis","Spastic paralysis","Cogwheel rigidity","Clasp-knife rigidity","Give-away weakness","Deconditioned","Mild-to-medarate deconditioning","Moderate-to-severe deconditioning","Guarding","WNL","Unremarkable","Grossly normal","Grossly intact","Functionally intact"}  Sensory: {Blank single:19197::"Improved","Movement-associated pain","Movement-associated discomfort","Impaired sensorium","Articular pain pattern","Arthropathic arthralgia","Dermatomal pain pattern","Non-dermatomal pain pattern","Musculoskeletal pain pattern","Myotome pain pattern","Neurogenic pain pattern","Neuropathic pain pattern","Referred pain pattern","Visceral pain pattern","Allodynia (Painful response to non-painful stimuli)","Anesthesia (Absence of sensation)","Anesthesia Dolorosa (Numbness over painful area)","Dysesthesias  (Unpleasant sensation to touch)","Hyperalgesia (Increased sensitivity to pain)","Hyperesthesia (Increased sensitivity to touch)","Hyperpathia (Painful, exaggerated response to nociceptive stimuli)","Hypoalgesia (Decreased sensitivity to painful stimuli)","Hypoesthesia/Hypesthesia (Reduced sensation to touch)","Paresthesia (Burning sensation)","Paresthesia (Tingling sensation)","WNL","No anomaly detected","Unimpaired"}  Sensory: {Blank single:19197::"Improved","Movement-associated pain","Movement-associated discomfort","Impaired sensorium","Articular pain pattern","Arthropathic arthralgia","Dermatomal pain pattern","Non-dermatomal pain pattern","Musculoskeletal pain pattern","Myotome pain pattern","Neurogenic pain pattern","Neuropathic pain pattern","Referred pain pattern","Visceral pain pattern","Allodynia (Painful response to non-painful stimuli)","Anesthesia (Absence of sensation)","Anesthesia Dolorosa (Numbness over painful area)","Dysesthesias (Unpleasant sensation to touch)","Hyperalgesia (Increased sensitivity to pain)","Hyperesthesia (Increased sensitivity to touch)","Hyperpathia (Painful, exaggerated response to nociceptive stimuli)","Hypoalgesia (Decreased sensitivity to painful stimuli)","Hypoesthesia/Hypesthesia (Reduced sensation to touch)","Paresthesia (Burning sensation)","Paresthesia (Tingling sensation)","WNL","No anomaly detected","Unimpaired"}  Palpation: {Blank single:19197::"Tender","Complains of area being tender to palpation","Uncomfortable","Positive","Negative","Increased muscle tone","Trigger Point","Muscular Atrophy","Non-tender","No complaints of tenderness","Hyperthermic","Hypothermic","Euthermic","No palpable anomalies"} {Blank single:19197::"Tinel's test","Trousseau's test (3 minute blood pressure cuff test) for hypocalcemia","     "} {Blank single:19197::"for right CTS","for left CTS","for bilateral CTS","     "}  Palpation: {Blank single:19197::"Tender","Complains of area being  tender to palpation","Uncomfortable","Positive","Negative","Increased muscle tone","Trigger Point","Muscular Atrophy","Non-tender","No complaints of tenderness","Hyperthermic","Hypothermic","Euthermic","No palpable anomalies"} {Blank single:19197::"Tinel's test","Trousseau's test (3 minute blood pressure cuff test) for hypocalcemia","     "} {Blank single:19197::"for right CTS","for left CTS","for bilateral CTS","     "}  Specialized Test(s): {Blank single:19197::"Tinel's","Phalen's","Tinel's/Phalen's","Deferred"} {Blank single:19197::"(+)","(-)","(+)/(-)","(-)/(+)","      "}  Specialized Test(s): {Blank single:19197::"Tinel's","Phalen's","Tinel's/Phalen's","Deferred"} {Blank single:19197::"(+)","(-)","(+)/(-)","(-)/(+)","      "}   Thoracic Spine Exam  Inspection: {Blank single:19197::"Well healed scar from previous spine surgery detected","prominent thoracic  Kyphosis","Significant thoracic kyphosis","Paravertebral muscle atrophy","No masses, redness, or swelling"} Alignment: {Blank single:19197::"Asymmetric","Symmetrical"} Functional ROM: {Blank single:19197::"Improved after treatment","Adequate ROM","Decreased ROM","Diminished ROM","Full ROM","Fused","Grossly intact ROM","Guarding","Limited ROM","Mechanically restricted ROM","Minimal ROM","Pain restricted ROM","Restricted ROM","Zero ROM","ROM appears unrestricted","ROM is within functional limits (WFL)","ROM is within normal limits (WNL)","Unrestricted ROM"} Stability: {Blank single:19197::"Possibly unstable","No instability detected"} Sensory: {Blank single:19197::"Improved","Movement associated pain","Movement associated discomfort","Impaired sensorium","Articular pain pattern","Dermatomal pain pattern","Musculoskeletal pain pattern","Myotome pain pattern","Neurogenic pain pattern","Neuropathic pain pattern","Referred pain pattern","Visceral pain pattern","Allodynia (Painful response to non-painful stimuli)","Anesthesia (Absence of  sensation)","Anesthesia Dolorosa (Numbness over painful area)","Dysesthesias (Unpleasant sensation to touch)","Hyperalgesia (Increased sensitivity to pain)","Hyperesthesia (Increased sensitivity to touch)","Hyperpathia (Painful, exaggerated response to nociceptive stimuli)","Hypoalgesia (Decreased sensitivity to painful stimuli)","Hypoesthesia/Hypesthesia (Reduced sensation to touch)","Paresthesia (Burning sensation)","Paresthesia (Tingling sensation)","WNL","No anomaly detected","Unimpaired"} Muscle strength & Tone: {Blank single:19197::"Tender","Complains of area being tender to palpation","Uncomfortable","Positive","Negative","Increased muscle tone","Trigger Point","Muscular Atrophy","Non-tender","No complaints of tenderness","Hyperthermic","Hypothermic","Euthermic","No palpable anomalies"}  Lumbar Spine Exam  Inspection: {Blank single:19197::"Well healed scar from previous spine surgery detected","Thoraco-lumbar Scoliosis","Lumbar Scoliosis","Paravertebral muscle atrophy","No masses, redness, or swelling"} Alignment: {Blank single:19197::"Scoliosis detected","Levoscoliosis","Dextroscoliosis","Asymmetric","Symmetrical"} Functional ROM: {Blank single:19197::"Improved after treatment","Adequate ROM","Decreased ROM","Diminished ROM","Full ROM","Fused","Grossly intact ROM","Guarding","Limited ROM","Mechanically restricted ROM","Minimal ROM","Pain restricted ROM","Restricted ROM","Zero ROM","ROM appears unrestricted","ROM is within functional limits (WFL)","ROM is within normal limits (WNL)","Unrestricted ROM"}{Blank single:19197::", to the right",", to the left",", bilaterally","     "} Stability: {Blank single:19197::"Possibly unstable","No instability detected"} Muscle strength & Tone: {Blank single:19197::"Increased muscle tone over affected area","Inconsistent level of performance when tested","Functionally intact"} Sensory: {Blank single:19197::"Improved","Movement-associated pain","Movement-associated  discomfort","Impaired sensorium","Articular pain pattern","Dermatomal pain pattern","Musculoskeletal pain pattern","Myotome pain pattern","Neurogenic pain pattern","Neuropathic pain pattern","Referred pain pattern","Visceral pain pattern","Allodynia (Painful response to non-painful stimuli)","Anesthesia (Absence of sensation)","Anesthesia Dolorosa (Numbness over painful area)","Dysesthesias (Unpleasant sensation to touch)","Hyperalgesia (Increased sensitivity to pain)","Hyperesthesia (Increased sensitivity to touch)","Hyperpathia (Painful, exaggerated response to nociceptive stimuli)","Hypoalgesia (Decreased sensitivity to painful stimuli)","Hypoesthesia/Hypesthesia (Reduced sensation to touch)","Paresthesia (Burning sensation)","Paresthesia (Tingling sensation)","WNL","No anomaly detected","Unimpaired"} Palpation: {Blank single:19197::"Tender","Complains of area being tender to palpation","Uncomfortable","Positive","Negative","Increased muscle tone","Trigger Point","Muscular Atrophy","Non-tender","No complaints of tenderness","Hyperthermic","Hypothermic","Euthermic","No palpable anomalies"} {Blank single:19197::"Right Fist Percussion Test","Left Fist Percussion Test","Bilateral Fist Percussion Test","     "} Provocative Tests: Lumbar Hyperextension and rotation test: {Blank single:19197::"Positive","Negative","Equivocal","Improved after treatment","Unable to perform","Non-contributory","improved","worsened","no change from prior assessment","evaluation deferred today"} {Blank single:19197::"bilaterally for facet joint pain.","on the right for facet joint pain.","on the left for facet joint pain.","due to pain.","due to fusion restriction.","     "} Patrick's Maneuver: {Blank single:19197::"Positive","Negative","Non-diagnostic","Improved after treatment","Unable to perform","worsened","unimproved","Unchanged","no change from prior assessment","evaluation deferred today"} {Blank single:19197::"for bilateral S-I  arthralgia","for right-sided S-I arthralgia","for left-sided S-I arthralgia","     "} {Blank single:19197::"and","     "} {Blank single:19197::"for bilateral hip arthralgia","for right hip arthralgia","for left hip arthralgia","due to pain","due to fusion restriction","     "}  Gait & Posture Assessment  Ambulation: {Blank single:19197::"Limited","Patient ambulates using a cane","Patient ambulates using crutches","Patient ambulates using a walker","Patient ambulates using a wheel chair","Patient came in today in a wheel chair","Incapable of ambulation without assistance","Nonfunctional","Dependent, Level II (constant assistance required)","Dependent, Level I (intermittent assistance required)","Dependent, Supervision required","Independent, Level surfaces only","Independent, Level and Non-level surfaces","Unassisted"} Gait: {Blank single:19197::"Age-related, senile gait pattern","Antalgic","Limited. Using assistive device to ambulate","Very limited, using assistive device to ambulate","Antalgic gait (limping)","Apraxia (Inability to execute a movement, upon request, without loss of motor or sensory function)","Ataxia (Poor voluntary coordination)","Ataxia (Appendicular)","Ataxia (Cerebellar)(Irregular, uncoordinated movement with inability to balance on one leg or perform tandem gait test)","Ataxia (Friedreich's)","Ataxia (Sensory) (Unsteady "stomping" gait with heavy heel strikes. Postural instability worsened by closing eyes.)","Ataxia (Truncal)("Drunken sailor" gait)","Ataxia (Vestibular)","Awkward","Cautious","Charlie-Chaplin (due to tibial torsion)","Choreiform (Irregular, jerky, involuntary movements)(Hyperkinetic)","Circumduction gait (due to hemiplegia)","Clumsy","Compensatory","Dystaxia (Mild degree of ataxia)","Dystonic","Frontal gait (Apraxia)","Hemiataxia (Ataxia limited to one side)","Hemiparetic (Post-stroke)(Ipsilateral arm flexion  and tiptoe/lateral foot walk)","High stepping gait (foot  drop)","Modified gait pattern (slower gait speed, wider stride width, and longer stance duration) associated with morbid obesity","Paraparetic (Stiffness, extension, adduction and scissoring of both legs)","Parkinsonian","Psychogenic","Scissor gait (cerebral palsy)","Shuffling gait","Staggering","Steppage","Stiff hip gait (hip ankylosis)","Stumbling","Trendelenburg (unstable hip)","Unaffected","Uneven","Waddling (Hip pathology)","Functionally WNL","Grossly intact","Improved after treatment","Relatively normal for age and body habitus"} Posture: {Blank single:19197::"Antalgic","Difficulty standing up straight, due to pain","Positive Romberg's test (Sensory Ataxia)(Worsening of balance and pointing with eyes closed)","Painful","Recombent","Relaxed","Tense","Difficulty with positional changes","Sway back","Lumbar lordosis","Thoracic kyphosis","Kyphosis-lordosis","Flat back","Forward head","Neutral Spine","Slouching","Drooping","Rigid","Poor","Good","WNL"}   Lower Extremity Exam    Side: Right lower extremity  Side: Left lower extremity  Inspection: {Blank single:19197::"Evidence of prior arthroplastic surgery","Below knee amputation (BKA)","Above knee amputation (AKA)","Contracture","Atrophy","Dystrophy","No gross anomalies detected","Edema","Pitting edema","Venous stasis edema","Positive color changes","Some redness observed","Increased temperature","Acrocyanosis","Normal skin color, temperature, and hair growth. No peripheral edema or cyanosis","No masses, redness, swelling, or asymmetry. No contractures"}  Inspection: {Blank single:19197::"Evidence of prior arthroplastic surgery","Below knee amputation (BKA)","Above knee amputation (AKA)","Contracture","Atrophy","Dystrophy","No gross anomalies detected","Edema","Pitting edema","Venous stasis edema","Positive color changes","Some redness observed","Increased temperature","Acrocyanosis","Normal skin color, temperature, and hair growth. No peripheral edema or  cyanosis","No masses, redness, swelling, or asymmetry. No contractures"}  Functional ROM: {Blank single:19197::"Improved after treatment","Impaired ROM","Adequate ROM","Decreased ROM","Diminished ROM","Full ROM","Fused","Grossly intact ROM","Guarding","Limited ROM","Mechanically restricted ROM","Minimal ROM","Pain restricted ROM","Restricted ROM","Zero ROM","ROM appears unrestricted","ROM is within functional limits (WFL)","ROM is within normal limits (WNL)","Unrestricted ROM"} {Blank single:19197::"for hip joint","for knee joint","for hip and knee joints","for all joints of the lower extremity","       "}  Functional ROM: {Blank single:19197::"Improved after treatment","Impaired ROM","Adequate ROM","Decreased ROM","Diminished ROM","Full ROM","Fused","Grossly intact ROM","Guarding","Limited ROM","Mechanically restricted ROM","Minimal ROM","Pain restricted ROM","Restricted ROM","Zero ROM","ROM appears unrestricted","ROM is within functional limits (WFL)","ROM is within normal limits (WNL)","Unrestricted ROM"} {Blank single:19197::"for hip joint","for knee joint","for hip and knee joints","for all joints of the lower extremity","       "}  Muscle strength & Tone: {Blank single:19197::"Able to Toe-walk & Heel-walk without problems","Foot drop","L2 weakness","L3 weakness","L4 weakness","L5 weakness","S1 weakness","Give-away weakness","Deconditioned","Mild-to-moderate deconditioning","Moderate-to-severe deconditioning","Guarding","Inconsistent level of performance when tested","Normal strength (5/5)","Movement possible against some resistance (4/5)","Movement possible against gravity, but not against resistance (3/5)","Movement possible, but not against gravity (0/2)","[id number] flickering, but no movement (1/5)","No motor contraction (0/5)","Flaccid paralysis","Spastic paralysis","Cogwheel rigidity","Clasp-knife rigidity","WNL","Unremarkable","Grossly normal","Grossly intact","Functionally intact"}  Muscle strength &  Tone: {Blank single:19197::"Able to Toe-walk & Heel-walk without problems","Foot drop","L2 weakness","L3 weakness","L4 weakness","L5 weakness","S1 weakness","Give-away weakness","Deconditioned","Mild-to-moderate deconditioning","Moderate-to-severe deconditioning","Guarding","Inconsistent level of performance when tested","Normal strength (5/5)","Movement possible against some resistance (4/5)","Movement possible against gravity, but not against resistance (3/5)","Movement possible, but not against gravity (4/4)","IHKVQQ flickering, but no movement (1/5)","No motor contraction (0/5)","Flaccid paralysis","Spastic paralysis","Cogwheel rigidity","Clasp-knife rigidity","WNL","Unremarkable","Grossly normal","Grossly intact","Functionally intact"}  Sensory: {Blank single:19197::"Improved","Movement-associated pain","Movement-associated discomfort","Impaired sensorium","Articular pain pattern","Arthropathic arthralgia","Dermatomal pain pattern","Non-dermatomal pain pattern","Musculoskeletal pain pattern","Myotome pain pattern","Neurogenic pain pattern","Neuropathic pain pattern","Referred pain pattern","Visceral pain pattern","Allodynia (Painful response to non-painful stimuli)","Anesthesia (Absence of sensation)","Anesthesia Dolorosa (Numbness over painful area)","Dysesthesias (Unpleasant sensation to touch)","Hyperalgesia (Increased sensitivity to pain)","Hyperesthesia (Increased sensitivity to touch)","Hyperpathia (Painful, exaggerated response to nociceptive stimuli)","Hypoalgesia (Decreased sensitivity to painful stimuli)","Hypoesthesia/Hypesthesia (Reduced sensation to touch)","Paresthesia (Burning sensation)","Paresthesia (Tingling sensation)","WNL","No anomaly detected","Unimpaired"}  Sensory: {Blank single:19197::"Improved","Movement-associated pain","Movement-associated discomfort","Impaired sensorium","Articular pain pattern","Arthropathic arthralgia","Dermatomal pain pattern","Non-dermatomal pain  pattern","Musculoskeletal pain pattern","Myotome pain pattern","Neurogenic pain pattern","Neuropathic pain pattern","Referred pain pattern","Visceral pain pattern","Allodynia (Painful response to non-painful stimuli)","Anesthesia (Absence of sensation)","Anesthesia Dolorosa (Numbness over painful area)","Dysesthesias (Unpleasant sensation to touch)","Hyperalgesia (Increased sensitivity to pain)","Hyperesthesia (Increased sensitivity to touch)","Hyperpathia (Painful, exaggerated response to nociceptive stimuli)","Hypoalgesia (Decreased sensitivity to painful stimuli)","Hypoesthesia/Hypesthesia (Reduced sensation to touch)","Paresthesia (Burning sensation)","Paresthesia (Tingling sensation)","WNL","No anomaly detected","Unimpaired"}  Palpation: {Blank single:19197::"Tender","Complains of area being tender to palpation","Uncomfortable","Positive","Negative","Increased muscle tone","Trigger Point","Muscular Atrophy","Non-tender","No complaints of tenderness","Hyperthermic","Hypothermic","Euthermic","No palpable anomalies"}  Palpation: {Blank single:19197::"Tender","Complains of  area being tender to palpation","Uncomfortable","Positive","Negative","Increased muscle tone","Trigger Point","Muscular Atrophy","Non-tender","No complaints of tenderness","Hyperthermic","Hypothermic","Euthermic","No palpable anomalies"}   Assessment  Primary Diagnosis & Pertinent Problem List: There were no encounter diagnoses.  Status Diagnosis  {Problem Stability:19197::"Improving","Improved","Not improving","Not responding","Persistent","Recurring","Reoccurring","Deteriorating","Having a Flare-up","Responding","Resolved","Stable","Unimproved","Worsening","Controlled"} {Problem Stability:19197::"Improving","Improved","Not improving","Not responding","Persistent","Recurring","Reoccurring","Deteriorating","Having a Flare-up","Responding","Resolved","Stable","Unimproved","Worsening","Controlled"} {Problem  Stability:19197::"Improving","Improved","Not improving","Not responding","Persistent","Recurring","Reoccurring","Deteriorating","Having a Flare-up","Responding","Resolved","Stable","Unimproved","Worsening","Controlled"} No diagnosis found.  Problems updated and reviewed during this visit: Problem  Chronic Pain of Lower Extremity, Bilateral  Spinal stenosis, thoracic region (T10-11)   Moderate lower thoracic spinal stenosis at T10-T11   Chronic sacroiliac joint pain (Bilateral) (L>R)  Chronic Pain of Left Upper Extremity  Chronic radicular cervical pain (L)  Allodynia  Chronic Pain Syndrome  Radicular Pain of Thoracic Region  Lumbar facet syndrome (Location of Primary Source of Pain) (Bilateral) (R>L)  Cervical Spondylosis  Chronic feet pain (Location of Secondary source of pain) (Bilateral) (R>L)   Bottom and arc of the foot, bilaterally. Currently seeing a podiatrist for this.   Lumbar Spondylosis  Chronic shoulder pain (Bilateral) (R>L)  Chronic carpal tunnel syndrome (Bilateral)  Chronic hip pain (Bilateral) (L>R)  Chronic upper back pain (Bilateral) (L>R)  Thoracic Radiculitis  Chronic low back pain (Location of Primary Source of Pain) (Bilateral) (R>L)  Chronic neck pain (Location of Tertiary source of pain) (Bilateral) (R>L)  Osa On Cpap  Myofascial Pain  Lumbar Spinal Stenosis  Lumbosacral Spondylosis Without Myelopathy  Spinal stenosis, lumbar region without neurogenic claudication (L1-2 and L4-5)  Spondylosis of Lumbar Region Without Myelopathy Or Radiculopathy  Osteoarthritis  Opiate use (75 MME/Day)  Long Term Prescription Opiate Use  Long Term Current Use of Opiate Analgesic  Nausea Without Vomiting  Diabetes Mellitus, Insulin Dependent (Iddm), Uncontrolled (Hcc)  Opiate Withdrawal (Hcc)  Elevated Liver Enzymes  Cirrhosis of Liver Without Ascites (Hcc)  Altered Mental Status   Overview:  Possible hepatic encephalopathy   Hepatic Encephalopathy (Hcc)  Elevated  Sedimentation Rate  Elevated C-Reactive Protein (Crp)  Encounter for Chronic Pain Management  Osteoporosis, Idiopathic  Abnormal MRI, lumbar spine (02/03/2015)   T10-11: Central disc extrusion, partially calcified; Moderate lower thoracic spinal stenosis. T11-12: Central disc protrusion; left facet hypertrophy; left T11 foraminal stenosis. T12-L1: Right paracentral disc protrusion. L1-2: Left paracentral and caudal disc extrusion; spinal stenosis at this level. L2-3: Disc bulge; facet hypertrophy. L3-4: Disc bulge; facet hypertrophy. L4-5: Circumferential disc bulge; severe facet hypertrophy; spinal stenosis. L5-S1: Disc bulge and facet hypertrophy.   Vitamin D Deficiency  B12 Deficiency  Folate Deficiency  Subacute lumbar radiculopathy (left side) (S1 dermatome)  Drowsiness  Episode of Syncope  Somnolence  Encounter for Therapeutic Drug Level Monitoring  Chronic epigastric abdominal pain  Ascites  [name] (Nonalcoholic Steatohepatitis)  Hypokalemia  Dysthymia  Other Social Stressor  Steatohepatitis  Type 2 diabetes mellitus (HCC)  COPD (chronic obstructive pulmonary disease) (HCC)  Gerd (Gastroesophageal Reflux Disease)  Anxiety  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, L1-2, & L4-5)  Lumbar and Sacral Osteoarthritis  Neuromyositis  Breath Shortness  Diastolic Dysfunction  Airway Hyperreactivity  Essential (Primary) Hypertension  Asthma  Clinical Depression  Constipation  Hyperglycemia  Cirrhosis (Hcc)  Uncontrolled Diabetes Mellitus (Hcc)   Plan of Care  Pharmacotherapy (Medications Ordered): No orders of the defined types were placed in this encounter.  New Prescriptions   No medications on file   Medications administered today: Lori Mckenzie had no medications administered during this visit. Lab-work, procedure(s), and/or referral(s): No orders of the defined types were placed in this encounter.  Imaging and/or referral(s): None  Interventional therapies: Planned,  scheduled, and/or  pending:   {Blank single:19197::"Not indicated","Medically contraindicated","On anticoagulants","To be determined at a later time","Not at this time."}   Considering:   ***   Palliative PRN treatment(s):   ***   Provider-requested follow-up: No Follow-up on file.  No future appointments. Primary Care Physician: Ardine Eng, MD Location: [facility] Note by: Vevelyn Francois NP Date: 06/23/2016; Time: 9:19 AM  Pain Score Disclaimer: We use the NRS-11 scale. This is a self-reported, subjective measurement of pain severity with only modest accuracy. It is used primarily to identify changes within a particular patient. It must be understood that outpatient pain scales are significantly less accurate that those used for research, where they can be applied under ideal controlled circumstances with minimal exposure to variables. In reality, the score is likely to be a combination of pain intensity and pain affect, where pain affect describes the degree of emotional arousal or changes in action readiness caused by the sensory experience of pain. Factors such as social and work situation, setting, emotional state, anxiety levels, expectation, and prior pain experience may influence pain perception and show large inter-individual differences that may also be affected by time variables.  Patient instructions provided during this appointment: There are no Patient Instructions on file for this visit.

## 2016-06-23 NOTE — Telephone Encounter (Signed)
Patient wants to know since she cant have RF procedure until Sept 5th can she have another procedure because she does not get any medications and will need something for pain. She does still want to have RF procedure

## 2016-06-23 NOTE — Progress Notes (Deleted)
Patient's Name: Lori Mckenzie  MRN: [id number]  Referring Provider: Ardine Eng, MD  DOB: 08/15/1965  PCP: Ardine Eng, MD  DOS: 06/23/2016  Note by: Vevelyn Francois NP  Service setting: Ambulatory outpatient  Specialty: Interventional Pain Management  Location: [facility] (AMB) Pain Management Facility    Patient type: Established    Primary Reason(s) for Visit: Encounter for prescription drug management (Level of risk: moderate) CC: No chief complaint on file.  HPI  Lori Mckenzie is a 51 y.o. year old, female patient, who comes today for a medication management evaluation. She has Type 2 diabetes mellitus ([place]); COPD (chronic obstructive pulmonary disease) (HCC); GERD (gastroesophageal reflux disease); OSA on CPAP; Anxiety; Steatohepatitis; Dysthymia; Other social stressor; Ascites; NASH (nonalcoholic steatohepatitis); Hypokalemia; Opiate use (75 MME/Day); Long term prescription opiate use; Long term current use of opiate analgesic; Encounter for therapeutic drug level monitoring; Chronic epigastric abdominal pain; Chronic low back pain (Location of Primary Source of Pain) (Bilateral) (R>L); Chronic neck pain (Location of Tertiary source of pain) (Bilateral) (R>L); Breath shortness; Diastolic dysfunction; Clinical depression; Airway hyperreactivity; Essential (primary) hypertension; 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, L1-2, & L4-5); Lumbar and sacral osteoarthritis; Myofascial pain; Drowsiness; Episode of syncope; Subacute lumbar radiculopathy (left side) (S1 dermatome); Vitamin D deficiency; B12 deficiency; Folate deficiency; Thoracic radiculitis; Elevated sedimentation rate; Elevated C-reactive protein (CRP); Lumbar facet syndrome (Location of Primary Source of Pain) (Bilateral) (R>L); Cervical spondylosis; Chronic feet pain (Location of Secondary source of pain) (Bilateral) (R>L); Lumbar spondylosis; Encounter for chronic pain management; Chronic shoulder pain (Bilateral) (R>L); Chronic carpal tunnel  syndrome (Bilateral); Chronic hip pain (Bilateral) (L>R); Chronic upper back pain (Bilateral) (L>R); Osteoporosis, idiopathic; Abnormal MRI, lumbar spine (02/03/2015); Hepatic encephalopathy ([place]); Radicular pain of thoracic region; Altered mental status; Cirrhosis of liver without ascites ([place]); Elevated liver enzymes; Opiate withdrawal ([place]); Chronic pain syndrome; Asthma; Lumbar spinal stenosis; Lumbosacral spondylosis without myelopathy; Neuromyositis; Somnolence; Spinal stenosis, lumbar region without neurogenic claudication (L1-2 and L4-5); Spondylosis of lumbar region without myelopathy or radiculopathy; Allodynia; Osteoarthritis; Chronic pain of left upper extremity; Chronic radicular cervical pain (L); Chronic sacroiliac joint pain (Bilateral) (L>R); Diabetes mellitus, insulin dependent (IDDM), uncontrolled ([place]); Chronic pain of lower extremity, bilateral; Spinal stenosis, thoracic region (T10-11); Nausea without vomiting; Cirrhosis ([place]); Constipation; Hyperglycemia; and Uncontrolled diabetes mellitus ([place]) on her problem list. Her primarily concern today is the No chief complaint on file.  Pain Assessment: Self-Reported Pain Score:  /10             Reported level is compatible with observation.          Lori Mckenzie was last scheduled for an appointment on Visit date not found for medication management. During today's appointment we reviewed Lori Mckenzie's chronic pain status, as well as her outpatient medication regimen.  The patient  reports that she does not use drugs. Her body mass index is unknown because there is no height or weight on file.  Further details on both, my assessment(s), as well as the proposed treatment plan, please see below.  Controlled Substance Pharmacotherapy Assessment REMS (Risk Evaluation and Mitigation Strategy)  Analgesic: *** MME/day: *** mg/day.  No notes on file Pharmacokinetics: Liberation and absorption (onset of action): WNL Distribution (time to peak  effect): WNL Metabolism and excretion (duration of action): WNL         Pharmacodynamics: Desired effects: Analgesia: Lori Mckenzie reports >50% benefit. Functional ability: Patient reports that medication allows her to accomplish basic ADLs Clinically meaningful improvement in function (CMIF): Sustained  CMIF goals met Perceived effectiveness: Described as relatively effective, allowing for increase in activities of daily living (ADL) Undesirable effects: Side-effects or Adverse reactions: None reported Monitoring: [place] PMP: Online review of the past 47-monthperiod conducted. Compliant with practice rules and regulations List of all UDS test(s) done:  Lab Results  Component Value Date   TOXASSSELUR FINAL 01/07/2016   TStedmanFINAL 09/28/2015   TVassarFINAL 03/05/2015   TOXASSSELUR FINAL 02/05/2015   T[place]FINAL 01/08/2015   SUMMARY FINAL 04/12/2016   Last UDS on record: ToxAssure Select 13  Date Value Ref Range Status  01/07/2016 FINAL  Final    Comment:    ==================================================================== TOXASSURE SELECT 13 (MW) ==================================================================== Test                             Result       Flag       Units Drug Present and Declared for Prescription Verification   Alprazolam                     164          EXPECTED   ng/mg creat   Alpha-hydroxyalprazolam        410          EXPECTED   ng/mg creat    Source of alprazolam is a scheduled prescription medication.    Alpha-hydroxyalprazolam is an expected metabolite of alprazolam. Drug Absent but Declared for Prescription Verification   Oxycodone                      Not Detected UNEXPECTED ng/mg creat ==================================================================== Test                      Result    Flag   Units      Ref Range   Creatinine              140              mg/dL       >=20 ==================================================================== Declared Medications:  The flagging and interpretation on this report are based on the  following declared medications.  Unexpected results may arise from  inaccuracies in the declared medications.  **Note: The testing scope of this panel includes these medications:  Alprazolam  Oxycodone  **Note: The testing scope of this panel does not include following  reported medications:  Albuterol  Aspirin  Cholecalciferol  Citalopram (Celexa)  Cyanocobalamin  Dicyclomine  Fluticasone (Flonase)  Folic acid (Folate)  Furosemide  Gabapentin  Hydrochlorothiazide  Ipratropium  Lactulose  Naloxone (Narcan)  Nystatin  Ondansetron  Pantoprazole  Promethazine  Rifaximin  Rizatriptan  Spironolactone  Vitamin B12 ==================================================================== For clinical consultation, please call [phone] ====================================================================    Summary  Date Value Ref Range Status  04/12/2016 FINAL  Final    Comment:    ==================================================================== TOXASSURE SELECT 13 (MW) ==================================================================== Test                             Result       Flag       Units Drug Present and Declared for Prescription Verification   Alprazolam                     590          EXPECTED  ng/mg creat   Alpha-hydroxyalprazolam        914          EXPECTED   ng/mg creat    Source of alprazolam is a scheduled prescription medication.    Alpha-hydroxyalprazolam is an expected metabolite of alprazolam. Drug Present not Declared for Prescription Verification   Alcohol, Ethyl                 0.054        UNEXPECTED g/dL    Sources of ethyl alcohol include alcoholic beverages or as a    fermentation product of glucose; glucose was not detected in this    specimen. Ethyl alcohol result should  be interpreted in the    context of all available clinical and behavioral information. Drug Absent but Declared for Prescription Verification   Oxycodone                      Not Detected UNEXPECTED ng/mg creat ==================================================================== Test                      Result    Flag   Units      Ref Range   Creatinine              63               mg/dL      >=20 ==================================================================== Declared Medications:  The flagging and interpretation on this report are based on the  following declared medications.  Unexpected results may arise from  inaccuracies in the declared medications.  **Note: The testing scope of this panel includes these medications:  Alprazolam (Xanax)  Oxycodone  **Note: The testing scope of this panel does not include following  reported medications:  Albuterol  Aspirin  Cholecalciferol  Citalopram (Celexa)  Cyanocobalamin  Diclofenac (Voltaren)  Dicyclomine (Bentyl)  Fluticasone (Flonase)  Folic acid (Folate)  Furosemide (Lasix)  Gabapentin  Insulin  Insulin (NovoLog)  Ipratropium (Atrovent)  Lactulose  Naloxone (Narcan)  Nystatin  Pantoprazole  Promethazine  Rifaximin (Xifaxan)  Rizatriptan (Maxalt)  Spironolactone  Sucralfate (Carafate)  Vitamin B12 ==================================================================== For clinical consultation, please call [phone]. ====================================================================    UDS interpretation: Compliant          Medication Assessment Form: Reviewed. Patient indicates being compliant with therapy Treatment compliance: Compliant Risk Assessment Profile: Aberrant behavior: See prior evaluations. None observed or detected today Comorbid factors increasing risk of overdose: See prior notes. No additional risks detected today Risk of substance use disorder (SUD): Low Opioid Risk Tool (ORT) Total  Score:    Interpretation Table:  Score <3 = Low Risk for SUD  Score between 4-7 = Moderate Risk for SUD  Score >8 = High Risk for Opioid Abuse   Risk Mitigation Strategies:  Patient Counseling: Covered Patient-Prescriber Agreement (PPA): Present and active  Notification to other healthcare providers: Done  Pharmacologic Plan: No change in therapy, at this time  Laboratory Chemistry  Inflammation Markers Lab Results  Component Value Date   CRP <0.8 04/12/2016   ESRSEDRATE 4 04/12/2016   (CRP: Acute Phase) (ESR: Chronic Phase) Renal Function Markers Lab Results  Component Value Date   BUN 17 06/13/2016   CREATININE 0.85 06/13/2016   GFRAA >60 06/13/2016   GFRNONAA >60 06/13/2016   Hepatic Function Markers Lab Results  Component Value Date   AST 65 (H) 08/19/2015   ALT 82 (H) 08/19/2015   ALBUMIN 4.8 08/19/2015  ALKPHOS 116 08/19/2015   Electrolytes Lab Results  Component Value Date   NA 134 (L) 06/13/2016   K 4.4 06/13/2016   CL 98 (L) 06/13/2016   CALCIUM 9.5 06/13/2016   MG 1.9 01/08/2015   Neuropathy Markers Lab Results  Component Value Date   VITAMINB12 272 04/12/2016   Bone Pathology Markers Lab Results  Component Value Date   ALKPHOS 116 08/19/2015   VD25OH 14.3 (L) 01/08/2015   VD125OH2TOT 28.8 01/08/2015   25OHVITD1 36 04/12/2016   25OHVITD2 3.9 04/12/2016   25OHVITD3 32 04/12/2016   CALCIUM 9.5 06/13/2016   Coagulation Parameters Lab Results  Component Value Date   INR 1.18 12/01/2015   LABPROT 15.1 12/01/2015   APTT 24.6 08/26/2013   PLT 111 (L) 06/13/2016   Cardiovascular Markers Lab Results  Component Value Date   BNP 11 12/11/2012   HGB 16.6 (H) 06/13/2016   HCT 46.4 06/13/2016   Note: Lab results reviewed.  Recent Diagnostic Imaging Review  No results found. Note: Imaging results reviewed.          Meds  The patient has a current medication list which includes the following prescription(s): albuterol, alprazolam,  cholecalciferol, citalopram, diclofenac sodium, dicyclomine, fluticasone, intrinsi b12-folate, furosemide, gabapentin, insulin aspart, insulin detemir, ipratropium, lactulose, naloxone, nystatin, pantoprazole, potassium chloride sa, promethazine, rifaximin, rizatriptan, spironolactone, and vitamin b-12.  Current Outpatient Prescriptions on File Prior to Visit  Medication Sig  . albuterol (ACCUNEB) 0.63 MG/3ML nebulizer solution Inhale 1 ampule by nebulization every six (6) hours as needed for wheezing.  Marland Kitchen ALPRAZolam (XANAX) 1 MG tablet Take 1-2 mg by mouth 3 (three) times daily as needed for anxiety.  . Cholecalciferol (VITAMIN D-1000 MAX ST) 1000 units tablet Take 2,000 Units by mouth daily.   . citalopram (CELEXA) 20 MG tablet Take 20 mg by mouth daily.   . diclofenac sodium (VOLTAREN) 1 % GEL Apply 4 g topically 4 (four) times daily.  Marland Kitchen dicyclomine (BENTYL) 10 MG capsule Take 10 mg by mouth 4 (four) times daily -  before meals and at bedtime.   . fluticasone (FLONASE) 50 MCG/ACT nasal spray Place 1-2 sprays into both nostrils daily as needed for rhinitis.   Derald Macleod Factor (INTRINSI B12-FOLATE) 115-726-20 MCG-MCG-MG TABS Take 1 tablet by mouth daily. Reported on 04/06/2015  . furosemide (LASIX) 40 MG tablet Take 80 mg by mouth as needed.   . gabapentin (NEURONTIN) 300 MG capsule Take 900 mg by mouth 4 (four) times daily as needed (for pain).   . insulin aspart (NOVOLOG) 100 UNIT/ML injection 18 units with meals 3 times daily plus sliding scale.  MDD 80 units.  . Insulin Detemir (LEVEMIR FLEXPEN [place]) Inject 30 Units into the skin 2 (two) times daily.   Marland Kitchen ipratropium (ATROVENT) 0.02 % nebulizer solution Inhale 500 mcg by nebulization Four (4) times a day.  . lactulose (CHRONULAC) 10 GM/15ML solution Take 30 g by mouth 3 (three) times daily as needed.   . naloxone [facility]) 2 MG/2ML injection Inject into the vein as needed.  . nystatin (MYCOSTATIN) powder Apply 1 g topically 3 (three)  times daily as needed (for irritation).   . pantoprazole (PROTONIX) 40 MG tablet Take 40 mg by mouth daily.   . potassium chloride SA (K-DUR,KLOR-CON) 20 MEQ tablet Take 20 mEq by mouth as needed.  . promethazine (PHENERGAN) 12.5 MG tablet Take 12.5 mg by mouth every 6 (six) hours as needed for nausea or vomiting.   . rifaximin (XIFAXAN) 550 MG TABS tablet  Take 550 mg by mouth 2 (two) times daily.   . rizatriptan (MAXALT) 10 MG tablet Take 10 mg by mouth as needed for migraine.   Marland Kitchen spironolactone (ALDACTONE) 100 MG tablet Take 100 mg by mouth daily.   . vitamin B-12 (CYANOCOBALAMIN) 1000 MCG tablet Take 2,000 mcg by mouth daily.  . [DISCONTINUED] SUMAtriptan (IMITREX) 50 MG tablet Take 50 mg by mouth every 2 (two) hours as needed. For migraines   No current facility-administered medications on file prior to visit.    ROS  Constitutional: Denies any fever or chills Gastrointestinal: No reported hemesis, hematochezia, vomiting, or acute GI distress Musculoskeletal: Denies any acute onset joint swelling, redness, loss of ROM, or weakness Neurological: No reported episodes of acute onset apraxia, aphasia, dysarthria, agnosia, amnesia, paralysis, loss of coordination, or loss of consciousness  Allergies  Lori Mckenzie is allergic to tape and vicodin [hydrocodone-acetaminophen].  PFSH  Drug: Lori Mckenzie  reports that she does not use drugs. Alcohol:  reports that she does not drink alcohol. Tobacco:  reports that she has never smoked. She has never used smokeless tobacco. Medical:  has a past medical history of Abdominal abscess (08/25/2014); Acid reflux (08/10/2010); Acute cervical myofascial strain (02/09/2016); Anxiety; Ascites; Asthma; Back pain; Bile leak, postoperative (07/17/2014); Brittle bone disease; Cancer ([place]); Cervical disc disease; Chronic kidney disease; Collagen vascular disease ([place]); COPD (chronic obstructive pulmonary disease) ([place]); Diabetes mellitus without complication ([place]); GERD  (gastroesophageal reflux disease); Hypertension; Hypothyroidism; Left upper quadrant pain (01/09/2014); Major depressive disorder with single episode (12/05/2011); Major depressive disorder, single episode (12/05/2011); Migraines; NASH (nonalcoholic steatohepatitis); Respiratory infection; Shock ([place]) (09/18/2014); Sleep apnea; Sleep apnea; Syncope (11/16/2014); Thyroid disease; and TIA (transient ischemic attack). Family: family history includes Heart disease in her brother and sister; Lung cancer in her father; Ulcers in her father and sister.  Past Surgical History:  Procedure Laterality Date  . ABDOMINAL HYSTERECTOMY    . CHOLECYSTECTOMY N/A 07/15/2014   Procedure: LAPAROSCOPIC CHOLECYSTECTOMY with liver biopsy ;  Surgeon: Sherri Rad, MD;  Location: [facility] ORS;  Service: General;  Laterality: N/A;  . COLONOSCOPY WITH PROPOFOL N/A 06/23/2014   Procedure: COLONOSCOPY WITH PROPOFOL;  Surgeon: Lollie Sails, MD;  Location: [facility] ENDOSCOPY;  Service: Endoscopy;  Laterality: N/A;  . ERCP N/A 07/16/2014   Procedure: ENDOSCOPIC RETROGRADE CHOLANGIOPANCREATOGRAPHY (ERCP);  Surgeon: Clarene Essex, MD;  Location: [facility] ENDOSCOPY;  Service: Endoscopy;  Laterality: N/A;  . ERCP N/A 10/03/2014   Procedure: ENDOSCOPIC RETROGRADE CHOLANGIOPANCREATOGRAPHY (ERCP);  Surgeon: Hulen Luster, MD;  Location: [facility] ENDOSCOPY;  Service: Gastroenterology;  Laterality: N/A;  . ESOPHAGOGASTRODUODENOSCOPY N/A 06/23/2014   Procedure: ESOPHAGOGASTRODUODENOSCOPY (EGD);  Surgeon: Lollie Sails, MD;  Location[facility] ENDOSCOPY;  Service: Endoscopy;  Laterality: N/A;  . ESOPHAGOGASTRODUODENOSCOPY (EGD) WITH PROPOFOL N/A 12/01/2015   Procedure: ESOPHAGOGASTRODUODENOSCOPY (EGD) WITH PROPOFOL;  Surgeon: Lollie Sails, MD;  Location: [facility] ENDOSCOPY;  Service: Endoscopy;  Laterality: N/A;  . TUBAL LIGATION    . WISDOM TOOTH EXTRACTION     Constitutional Exam  General appearance: Well nourished, well developed, and well hydrated. In no apparent  acute distress There were no vitals filed for this visit. BMI Assessment: Estimated body mass index is 45.91 kg/m as calculated from the following:   Height as of 06/13/16: _0  (1.575 m).   Weight as of 06/13/16: 251 lb (113.9 kg).  BMI interpretation table: BMI level Category Range association with higher incidence of chronic pain  <18 kg/m2 Underweight   18.5-24.9 kg/m2 Ideal body weight  25-29.9 kg/m2 Overweight Increased incidence by 20%  30-34.9 kg/m2 Obese (Class I) Increased incidence by 68%  35-39.9 kg/m2 Severe obesity (Class II) Increased incidence by 136%  >40 kg/m2 Extreme obesity (Class III) Increased incidence by 254%   BMI Readings from Last 4 Encounters:  06/13/16 45.91 kg/m  06/06/16 47.55 kg/m  05/19/16 47.55 kg/m  05/16/16 47.55 kg/m   Wt Readings from Last 4 Encounters:  06/13/16 251 lb (113.9 kg)  06/06/16 260 lb (117.9 kg)  05/19/16 260 lb (117.9 kg)  05/16/16 260 lb (117.9 kg)  Psych/Mental status: Alert, oriented x 3 (person, place, & time)       Eyes: PERLA Respiratory: No evidence of acute respiratory distress  Cervical Spine Exam  Inspection: No masses, redness, or swelling Alignment: Symmetrical Functional ROM: Unrestricted ROM      Stability: No instability detected Muscle strength & Tone: Functionally intact Sensory: Unimpaired Palpation: No palpable anomalies              Upper Extremity (UE) Exam    Side: Right upper extremity  Side: Left upper extremity  Inspection: No masses, redness, swelling, or asymmetry. No contractures  Inspection: No masses, redness, swelling, or asymmetry. No contractures  Functional ROM: Unrestricted ROM          Functional ROM: Unrestricted ROM          Muscle strength & Tone: Functionally intact  Muscle strength & Tone: Functionally intact  Sensory: Unimpaired  Sensory: Unimpaired  Palpation: No palpable anomalies              Palpation: No palpable anomalies              Specialized Test(s): Deferred          Specialized Test(s): Deferred          Thoracic Spine Exam  Inspection: No masses, redness, or swelling Alignment: Symmetrical Functional ROM: Unrestricted ROM Stability: No instability detected Sensory: Unimpaired Muscle strength & Tone: No palpable anomalies  Lumbar Spine Exam  Inspection: No masses, redness, or swelling Alignment: Symmetrical Functional ROM: Unrestricted ROM      Stability: No instability detected Muscle strength & Tone: Functionally intact Sensory: Unimpaired Palpation: No palpable anomalies       Provocative Tests: Lumbar Hyperextension and rotation test: evaluation deferred today       Patrick's Maneuver: evaluation deferred today                    Gait & Posture Assessment  Ambulation: Unassisted Gait: Relatively normal for age and body habitus Posture: WNL   Lower Extremity Exam    Side: Right lower extremity  Side: Left lower extremity  Inspection: No masses, redness, swelling, or asymmetry. No contractures  Inspection: No masses, redness, swelling, or asymmetry. No contractures  Functional ROM: Unrestricted ROM          Functional ROM: Unrestricted ROM          Muscle strength & Tone: Functionally intact  Muscle strength & Tone: Functionally intact  Sensory: Unimpaired  Sensory: Unimpaired  Palpation: No palpable anomalies  Palpation: No palpable anomalies   Assessment  Primary Diagnosis & Pertinent Problem List: There were no encounter diagnoses.  Status Diagnosis  Controlled Controlled Controlled No diagnosis found.  Problems updated and reviewed during this visit: Problem  Chronic Pain of Lower Extremity, Bilateral  Spinal stenosis, thoracic region (T10-11)   Moderate lower thoracic spinal stenosis at T10-T11   Chronic sacroiliac joint pain (Bilateral) (L>R)  Chronic Pain of Left Upper Extremity  Chronic radicular cervical pain (L)  Allodynia  Chronic Pain Syndrome  Radicular Pain of Thoracic Region  Lumbar facet syndrome  (Location of Primary Source of Pain) (Bilateral) (R>L)  Cervical Spondylosis  Chronic feet pain (Location of Secondary source of pain) (Bilateral) (R>L)   Bottom and arc of the foot, bilaterally. Currently seeing a podiatrist for this.   Lumbar Spondylosis  Chronic shoulder pain (Bilateral) (R>L)  Chronic carpal tunnel syndrome (Bilateral)  Chronic hip pain (Bilateral) (L>R)  Chronic upper back pain (Bilateral) (L>R)  Thoracic Radiculitis  Chronic low back pain (Location of Primary Source of Pain) (Bilateral) (R>L)  Chronic neck pain (Location of Tertiary source of pain) (Bilateral) (R>L)  [place] On Cpap  Myofascial Pain  Lumbar Spinal Stenosis  Lumbosacral Spondylosis Without Myelopathy  Spinal stenosis, lumbar region without neurogenic claudication (L1-2 and L4-5)  Spondylosis of Lumbar Region Without Myelopathy Or Radiculopathy  Osteoarthritis  Opiate use (75 MME/Day)  Long Term Prescription Opiate Use  Long Term Current Use of Opiate Analgesic  Nausea Without Vomiting  Diabetes Mellitus, Insulin Dependent (Iddm), Uncontrolled (Hcc)  Opiate Withdrawal (Hcc)  Elevated Liver Enzymes  Cirrhosis of Liver Without Ascites (Hcc)  Altered Mental Status   Overview:  Possible hepatic encephalopathy   Hepatic Encephalopathy (Hcc)  Elevated Sedimentation Rate  Elevated C-Reactive Protein (Crp)  Encounter for Chronic Pain Management  Osteoporosis, Idiopathic  Abnormal MRI, lumbar spine (02/03/2015)   T10-11: Central disc extrusion, partially calcified; Moderate lower thoracic spinal stenosis. T11-12: Central disc protrusion; left facet hypertrophy; left T11 foraminal stenosis. T12-L1: Right paracentral disc protrusion. L1-2: Left paracentral and caudal disc extrusion; spinal stenosis at this level. L2-3: Disc bulge; facet hypertrophy. L3-4: Disc bulge; facet hypertrophy. L4-5: Circumferential disc bulge; severe facet hypertrophy; spinal stenosis. L5-S1: Disc bulge and facet hypertrophy.    Vitamin D Deficiency  B12 Deficiency  Folate Deficiency  Subacute lumbar radiculopathy (left side) (S1 dermatome)  Drowsiness  Episode of Syncope  Somnolence  Encounter for Therapeutic Drug Level Monitoring  Chronic epigastric abdominal pain  Ascites  [name] (Nonalcoholic Steatohepatitis)  Hypokalemia  Dysthymia  Other Social Stressor  Steatohepatitis  Type 2 diabetes mellitus (HCC)  COPD (chronic obstructive pulmonary disease) (HCC)  Gerd (Gastroesophageal Reflux Disease)  Anxiety  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, L1-2, & L4-5)  Lumbar and Sacral Osteoarthritis  Neuromyositis  Breath Shortness  Diastolic Dysfunction  Airway Hyperreactivity  Essential (Primary) Hypertension  Asthma  Clinical Depression  Constipation  Hyperglycemia  Cirrhosis (Hcc)  Uncontrolled Diabetes Mellitus (Hcc)   Plan of Care  Pharmacotherapy (Medications Ordered): No orders of the defined types were placed in this encounter.  New Prescriptions   No medications on file   Medications administered today: Lori Mckenzie had no medications administered during this visit. Lab-work, procedure(s), and/or referral(s): No orders of the defined types were placed in this encounter.  Imaging and/or referral(s): None  Interventional therapies: Planned, scheduled, and/or pending:   Not at this time.   Considering:   ***   Palliative PRN treatment(s):   ***   Provider-requested follow-up: No Follow-up on file.  Future Appointments Date Time Provider [place]  06/23/2016 9:15 AM Vevelyn Francois, NP [facility] None   Primary Care Physician: Ardine Eng, MD Location: [facility] Note by: Vevelyn Francois NP Date: 06/23/2016; Time: 8:25 AM  Pain Score Disclaimer: We use the NRS-11 scale. This is a self-reported, subjective measurement of pain severity with only modest accuracy.  It is used primarily to identify changes within a particular patient. It must  be understood that outpatient pain scales are significantly less accurate that those used for research, where they can be applied under ideal controlled circumstances with minimal exposure to variables. In reality, the score is likely to be a combination of pain intensity and pain affect, where pain affect describes the degree of emotional arousal or changes in action readiness caused by the sensory experience of pain. Factors such as social and work situation, setting, emotional state, anxiety levels, expectation, and prior pain experience may influence pain perception and show large inter-individual differences that may also be affected by time variables.  Patient instructions provided during this appointment: There are no Patient Instructions on file for this visit.

## 2016-06-24 ENCOUNTER — Telehealth: Payer: Self-pay | Admitting: Pain Medicine

## 2016-06-24 NOTE — Telephone Encounter (Signed)
Previous note states she needs to wait two from last procedure.

## 2016-06-24 NOTE — Telephone Encounter (Signed)
Needs to wait 2 weeks from 06/23/16 and can have procedure with Dr Dossie Arbour.  Schedule her please for a lumbar facet without steroids.

## 2016-06-24 NOTE — Telephone Encounter (Signed)
Patient wants to know how soon can she have an appt for procedure. Please discuss with Dr. Dossie Arbour on Monday and let patient know

## 2016-06-28 ENCOUNTER — Emergency Department: Payer: Medicaid Other

## 2016-06-28 ENCOUNTER — Telehealth: Payer: Self-pay | Admitting: Pain Medicine

## 2016-06-28 ENCOUNTER — Encounter: Payer: Self-pay | Admitting: *Deleted

## 2016-06-28 ENCOUNTER — Observation Stay
Admission: EM | Admit: 2016-06-28 | Discharge: 2016-06-30 | Disposition: A | Discharge status: Home / Self Care | Payer: Medicaid Other | Attending: Internal Medicine | Admitting: Internal Medicine

## 2016-06-28 DIAGNOSIS — R109 Unspecified abdominal pain: Secondary | ICD-10-CM | POA: Diagnosis present

## 2016-06-28 DIAGNOSIS — Z794 Long term (current) use of insulin: Secondary | ICD-10-CM | POA: Diagnosis not present

## 2016-06-28 DIAGNOSIS — K766 Portal hypertension: Secondary | ICD-10-CM | POA: Diagnosis not present

## 2016-06-28 DIAGNOSIS — K2900 Acute gastritis without bleeding: Secondary | ICD-10-CM | POA: Insufficient documentation

## 2016-06-28 DIAGNOSIS — I700 Atherosclerosis of aorta: Secondary | ICD-10-CM | POA: Insufficient documentation

## 2016-06-28 DIAGNOSIS — Z8673 Personal history of transient ischemic attack (TIA), and cerebral infarction without residual deficits: Secondary | ICD-10-CM | POA: Insufficient documentation

## 2016-06-28 DIAGNOSIS — E538 Deficiency of other specified B group vitamins: Secondary | ICD-10-CM | POA: Insufficient documentation

## 2016-06-28 DIAGNOSIS — E1140 Type 2 diabetes mellitus with diabetic neuropathy, unspecified: Secondary | ICD-10-CM | POA: Diagnosis not present

## 2016-06-28 DIAGNOSIS — K2950 Unspecified chronic gastritis without bleeding: Secondary | ICD-10-CM | POA: Insufficient documentation

## 2016-06-28 DIAGNOSIS — I129 Hypertensive chronic kidney disease with stage 1 through stage 4 chronic kidney disease, or unspecified chronic kidney disease: Secondary | ICD-10-CM | POA: Insufficient documentation

## 2016-06-28 DIAGNOSIS — G43909 Migraine, unspecified, not intractable, without status migrainosus: Secondary | ICD-10-CM | POA: Insufficient documentation

## 2016-06-28 DIAGNOSIS — K3189 Other diseases of stomach and duodenum: Secondary | ICD-10-CM | POA: Diagnosis not present

## 2016-06-28 DIAGNOSIS — E559 Vitamin D deficiency, unspecified: Secondary | ICD-10-CM | POA: Diagnosis not present

## 2016-06-28 DIAGNOSIS — R162 Hepatomegaly with splenomegaly, not elsewhere classified: Secondary | ICD-10-CM | POA: Insufficient documentation

## 2016-06-28 DIAGNOSIS — K7460 Unspecified cirrhosis of liver: Secondary | ICD-10-CM | POA: Insufficient documentation

## 2016-06-28 DIAGNOSIS — K7581 Nonalcoholic steatohepatitis (NASH): Secondary | ICD-10-CM | POA: Insufficient documentation

## 2016-06-28 DIAGNOSIS — N183 Chronic kidney disease, stage 3 (moderate): Secondary | ICD-10-CM | POA: Diagnosis not present

## 2016-06-28 DIAGNOSIS — K922 Gastrointestinal hemorrhage, unspecified: Secondary | ICD-10-CM

## 2016-06-28 DIAGNOSIS — E1122 Type 2 diabetes mellitus with diabetic chronic kidney disease: Secondary | ICD-10-CM | POA: Diagnosis not present

## 2016-06-28 DIAGNOSIS — K219 Gastro-esophageal reflux disease without esophagitis: Secondary | ICD-10-CM | POA: Insufficient documentation

## 2016-06-28 DIAGNOSIS — G4733 Obstructive sleep apnea (adult) (pediatric): Secondary | ICD-10-CM | POA: Insufficient documentation

## 2016-06-28 DIAGNOSIS — J449 Chronic obstructive pulmonary disease, unspecified: Secondary | ICD-10-CM | POA: Insufficient documentation

## 2016-06-28 DIAGNOSIS — F419 Anxiety disorder, unspecified: Secondary | ICD-10-CM | POA: Diagnosis not present

## 2016-06-28 DIAGNOSIS — Z9989 Dependence on other enabling machines and devices: Secondary | ICD-10-CM | POA: Insufficient documentation

## 2016-06-28 DIAGNOSIS — F329 Major depressive disorder, single episode, unspecified: Secondary | ICD-10-CM | POA: Diagnosis not present

## 2016-06-28 DIAGNOSIS — I8500 Esophageal varices without bleeding: Secondary | ICD-10-CM | POA: Diagnosis not present

## 2016-06-28 DIAGNOSIS — K921 Melena: Principal | ICD-10-CM | POA: Insufficient documentation

## 2016-06-28 DIAGNOSIS — F17210 Nicotine dependence, cigarettes, uncomplicated: Secondary | ICD-10-CM | POA: Insufficient documentation

## 2016-06-28 DIAGNOSIS — Z6841 Body Mass Index (BMI) 40.0 and over, adult: Secondary | ICD-10-CM | POA: Insufficient documentation

## 2016-06-28 DIAGNOSIS — R1010 Upper abdominal pain, unspecified: Secondary | ICD-10-CM

## 2016-06-28 LAB — GLUCOSE, CAPILLARY: Glucose-Capillary: 249 mg/dL — ABNORMAL HIGH (ref 65–99)

## 2016-06-28 LAB — COMPREHENSIVE METABOLIC PANEL
ALT: 105 U/L — ABNORMAL HIGH (ref 14–54)
AST: 94 U/L — ABNORMAL HIGH (ref 15–41)
Albumin: 4.1 g/dL (ref 3.5–5.0)
Alkaline Phosphatase: 102 U/L (ref 38–126)
Anion gap: 13 (ref 5–15)
BUN: 13 mg/dL (ref 6–20)
CO2: 22 mmol/L (ref 22–32)
Calcium: 9.3 mg/dL (ref 8.9–10.3)
Chloride: 100 mmol/L — ABNORMAL LOW (ref 101–111)
Creatinine, Ser: 0.57 mg/dL (ref 0.44–1.00)
GFR calc Af Amer: >60 mL/min (ref >60)
GFR calc non Af Amer: >60 mL/min (ref >60)
Glucose, Bld: 351 mg/dL — ABNORMAL HIGH (ref 65–99)
Potassium: 3.7 mmol/L (ref 3.5–5.1)
Sodium: 135 mmol/L (ref 135–145)
Total Bilirubin: 0.9 mg/dL (ref 0.3–1.2)
Total Protein: 7.2 g/dL (ref 6.5–8.1)

## 2016-06-28 LAB — URINALYSIS, COMPLETE (UACMP) WITH MICROSCOPIC
Bilirubin Urine: NEGATIVE
Glucose, UA: 150 mg/dL — AB
Hgb urine dipstick: NEGATIVE
Ketones, ur: NEGATIVE mg/dL
Nitrite: NEGATIVE
Protein, ur: NEGATIVE mg/dL
Specific Gravity, Urine: 1.024 (ref 1.005–1.030)
pH: 5 (ref 5.0–8.0)

## 2016-06-28 LAB — CBC
HCT: 46.4 % (ref 35.0–47.0)
Hemoglobin: 16 g/dL (ref 12.0–16.0)
MCH: 35.2 pg — ABNORMAL HIGH (ref 26.0–34.0)
MCHC: 34.5 g/dL (ref 32.0–36.0)
MCV: 101.9 fL — ABNORMAL HIGH (ref 80.0–100.0)
Platelets: 92 10*3/uL — ABNORMAL LOW (ref 150–440)
RBC: 4.55 MIL/uL (ref 3.80–5.20)
RDW: 14 % (ref 11.5–14.5)
WBC: 5.8 10*3/uL (ref 3.6–11.0)

## 2016-06-28 LAB — LIPASE, BLOOD: Lipase: 31 U/L (ref 11–51)

## 2016-06-28 IMAGING — US US ABDOMEN COMPLETE
1 series · 13 of 25 positions shown · non-contrast
Comparison: It 10/04/2014.  CT 10/04/2014.

CLINICAL DATA: Cirrhosis, nonalcoholic. Cholecystectomy. Prior
common bile duct injury. Recent stent removal.

EXAM:
ABDOMEN ULTRASOUND COMPLETE

[Series 1: us abdomen complete · 0.31mm/px · 13 of 72 slices shown]
[im 1/72]
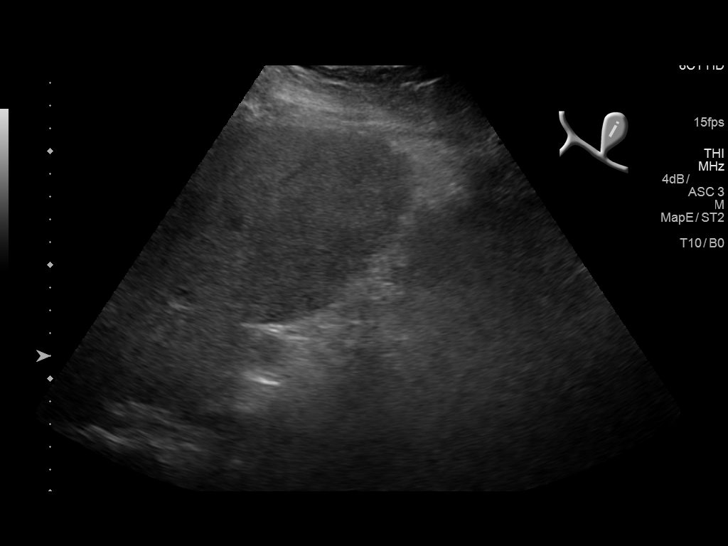
[im 6/72]
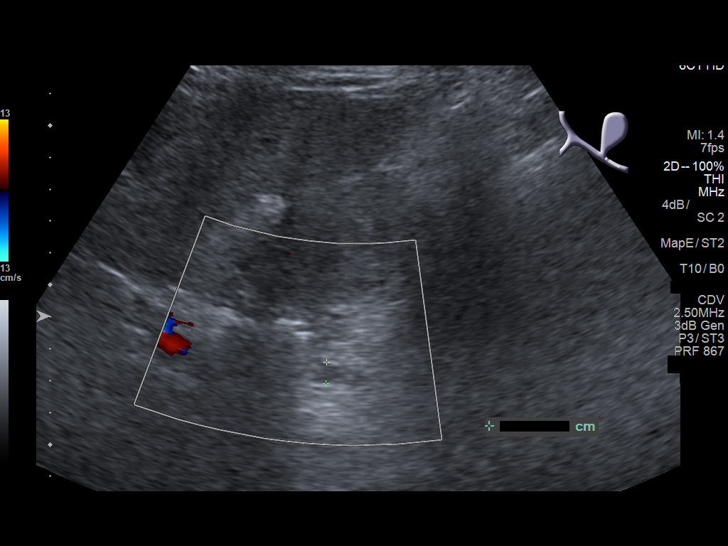
[im 12/72]
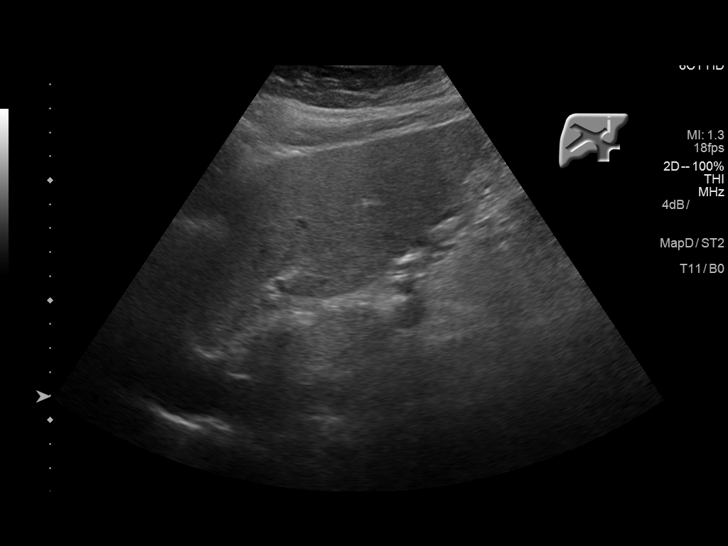
[im 18/72]
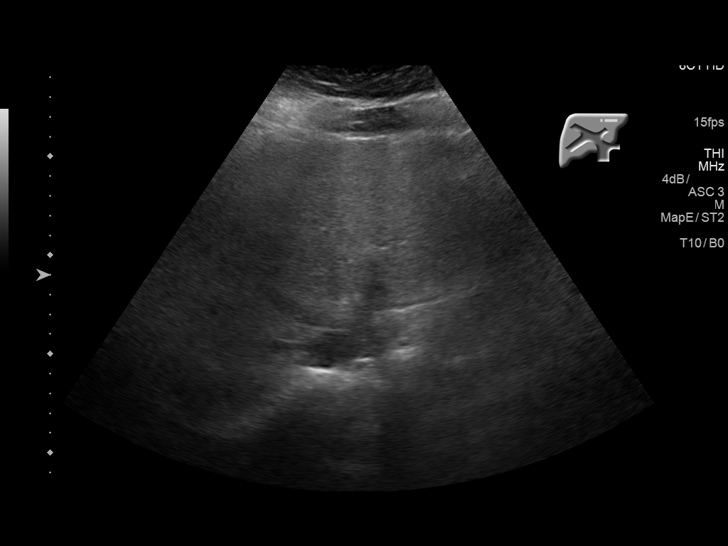
[im 24/72]
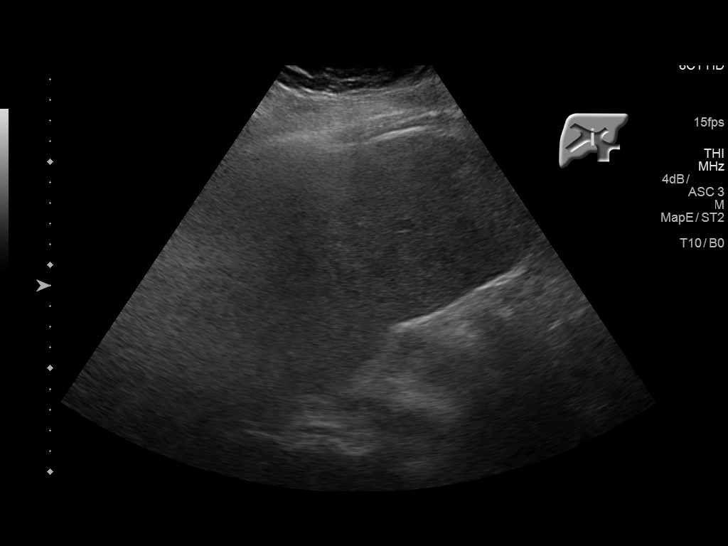
[im 30/72]
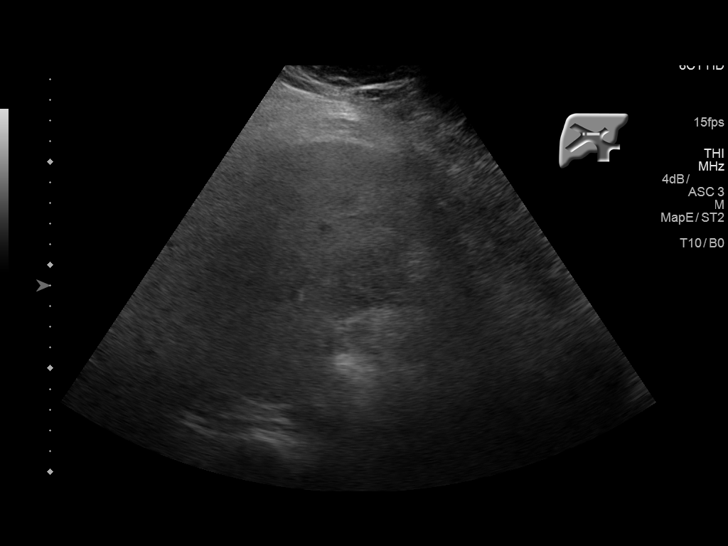
[im 36/72]
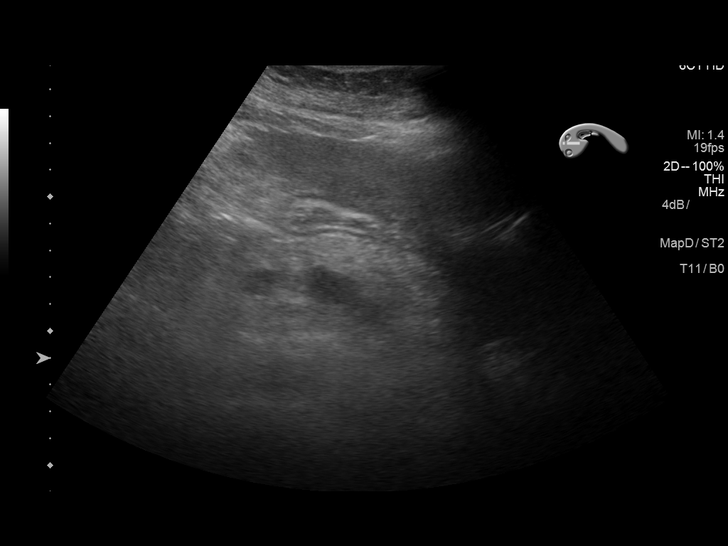
[im 42/72]
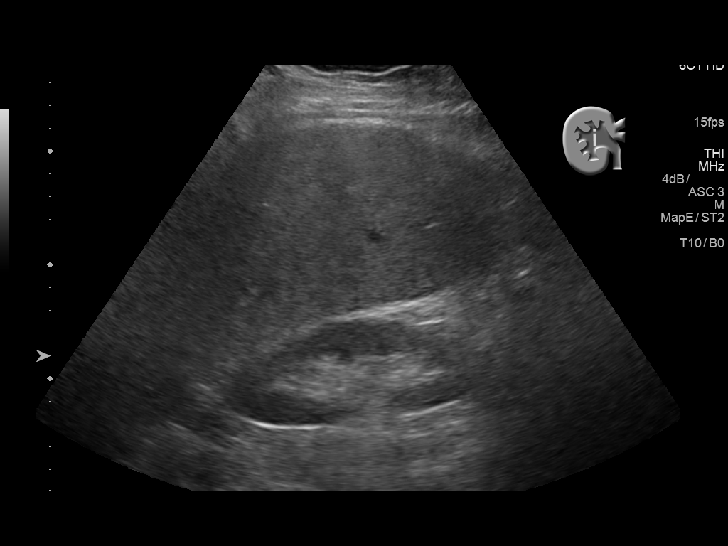
[im 48/72]
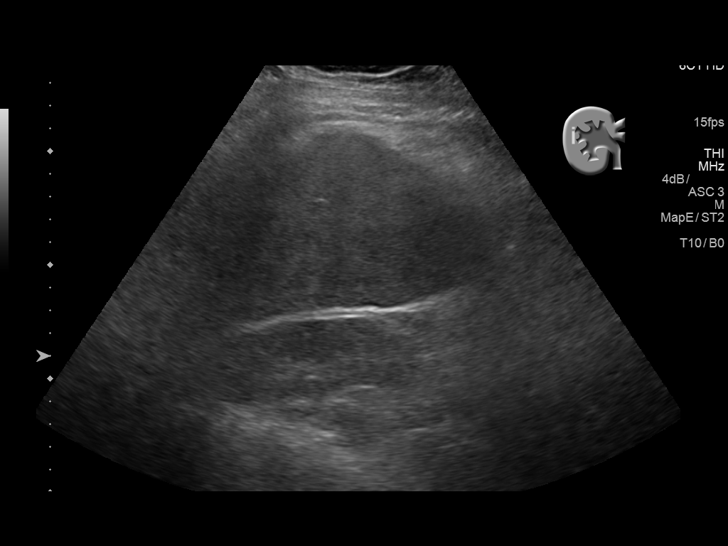
[im 54/72]
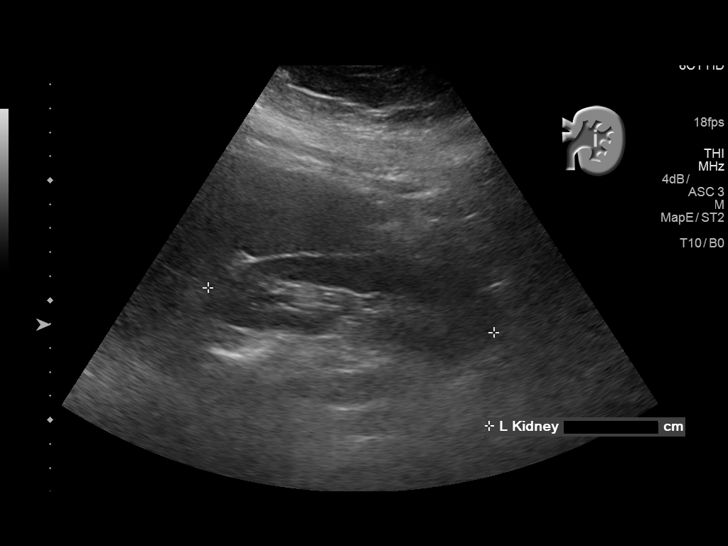
[im 60/72]
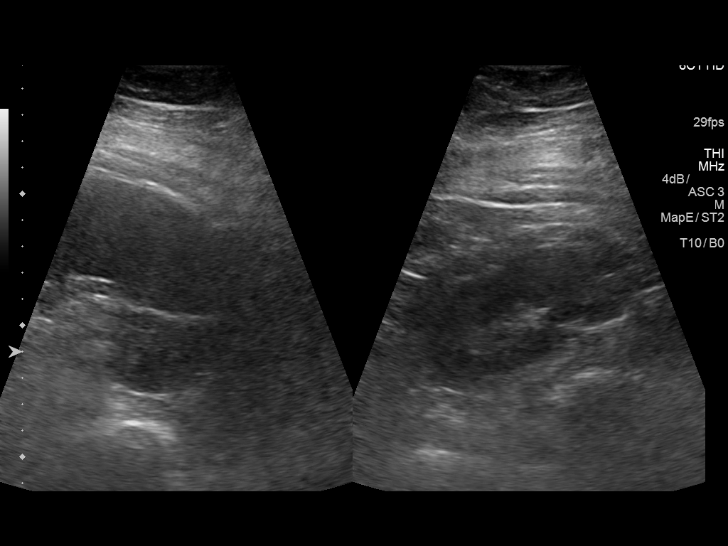
[im 66/72]
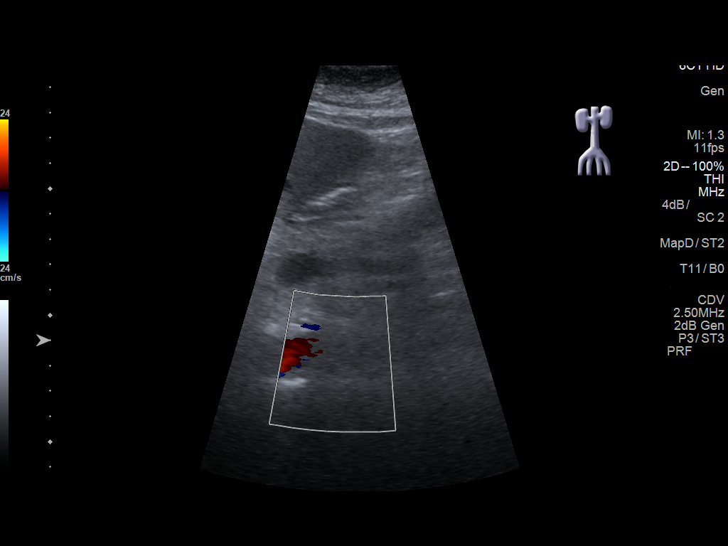
[im 72/72]
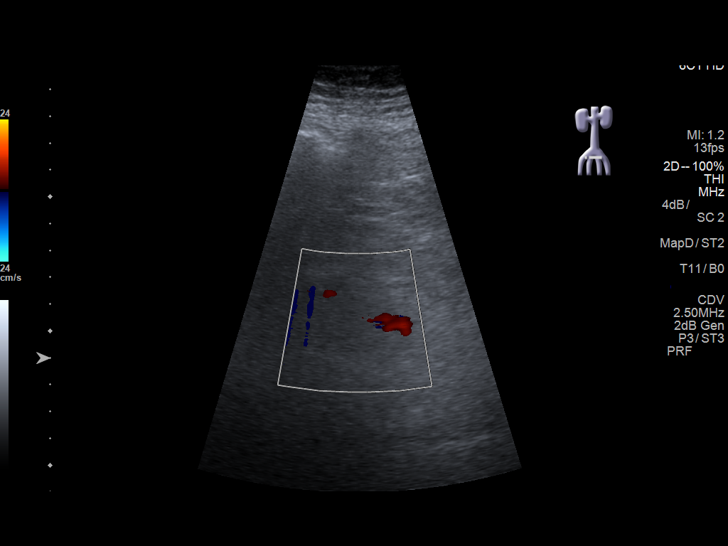

[13 of 25 positions shown; findings below may reference images not displayed]

FINDINGS: Gallbladder: Cholecystectomy. Echogenicity and shadowing noted in
the gallbladder fossa. Air within the gallbladder fossa cannot be
excluded. Patient was very tender in the right upper quadrant on
exam . IV and oral contrast enhanced CT of the abdomen and pelvis
should be considered for further evaluation to exclude an abscess.

Common bile duct: Diameter: 6.7 mm

Liver: Liver is echogenic consistent with fatty infiltration and/or
hepatocellular disease. Mild contour irregularity is noted,
cirrhosis cannot be excluded. 2.2 cm cyst is noted in the left
hepatic lobe, similar finding noted on prior CT.

IVC: No abnormality visualized.

Pancreas: Visualized portion unremarkable.

Spleen: Splenomegaly at 17.8 cm with a volume of 978 cc.

Right Kidney: Length: 11.5 cm. Echogenicity within normal limits. No
mass or hydronephrosis visualized.

Left Kidney: Length: 12.1 cm. Echogenicity within normal limits. No
mass or hydronephrosis visualized.

Abdominal aorta: No aneurysm visualized.

Other findings: No ascites noted on today's exam .
IMPRESSION: 1. Cholecystectomy. Air in the gallbladder fossa cannot be excluded.
Patient is very tender in the right upper quadrant on exam. IV and
oral contrast enhanced CT of the abdomen and pelvis should be
considered for further evaluation, particularly given the patient's
prior history of common bile duct injury and recent biliary stent
removal. No ultrasound evidence of biliary distention.

2. Liver is echogenic consistent with fatty infiltration and/or
hepatocellular disease. Cirrhosis cannot be excluded. No ascites
noted on today's exam.

3.  Splenomegaly.

## 2016-06-28 MED ORDER — SODIUM CHLORIDE 0.9 % IV BOLUS (SEPSIS)
1000.0000 mL | Freq: Once | INTRAVENOUS | Status: AC
  Administered 2016-06-28: 1000 mL via INTRAVENOUS

## 2016-06-28 MED ORDER — ONDANSETRON HCL 4 MG/2ML IJ SOLN
4.0000 mg | Freq: Once | INTRAMUSCULAR | Status: AC
  Administered 2016-06-28: 4 mg via INTRAVENOUS
  Filled 2016-06-28: qty 2

## 2016-06-28 MED ORDER — DICLOFENAC SODIUM 1 % TD GEL
4.0000 g | Freq: Four times a day (QID) | TRANSDERMAL | Status: DC
  Administered 2016-06-29 (×4): 4 g via TOPICAL
  Filled 2016-06-28: qty 100

## 2016-06-28 MED ORDER — ONDANSETRON HCL 4 MG/2ML IJ SOLN
4.0000 mg | Freq: Four times a day (QID) | INTRAMUSCULAR | Status: DC | PRN
  Administered 2016-06-29 – 2016-06-30 (×3): 4 mg via INTRAVENOUS
  Filled 2016-06-28 (×3): qty 2

## 2016-06-28 MED ORDER — ALPRAZOLAM 1 MG PO TABS
1.0000 mg | ORAL_TABLET | Freq: Three times a day (TID) | ORAL | Status: DC
  Administered 2016-06-29 – 2016-06-30 (×4): 1 mg via ORAL
  Filled 2016-06-28 (×4): qty 1

## 2016-06-28 MED ORDER — INSULIN ASPART 100 UNIT/ML ~~LOC~~ SOLN
0.0000 [IU] | Freq: Three times a day (TID) | SUBCUTANEOUS | Status: DC
  Administered 2016-06-29: 5 [IU] via SUBCUTANEOUS
  Administered 2016-06-29: 3 [IU] via SUBCUTANEOUS
  Administered 2016-06-29: 2 [IU] via SUBCUTANEOUS
  Administered 2016-06-30: 5 [IU] via SUBCUTANEOUS
  Filled 2016-06-28 (×5): qty 1

## 2016-06-28 MED ORDER — SUMATRIPTAN SUCCINATE 50 MG PO TABS
50.0000 mg | ORAL_TABLET | Freq: Once | ORAL | Status: AC
  Administered 2016-06-28: 50 mg via ORAL
  Filled 2016-06-28: qty 1

## 2016-06-28 MED ORDER — POTASSIUM CHLORIDE CRYS ER 10 MEQ PO TBCR
10.0000 meq | EXTENDED_RELEASE_TABLET | Freq: Every day | ORAL | Status: DC
  Administered 2016-06-29 – 2016-06-30 (×2): 10 meq via ORAL
  Filled 2016-06-28 (×2): qty 1

## 2016-06-28 MED ORDER — ACETAMINOPHEN 650 MG RE SUPP: 650.0000 mg | Freq: Four times a day (QID) | RECTAL | Status: DC | PRN

## 2016-06-28 MED ORDER — DICYCLOMINE HCL 10 MG PO CAPS
10.0000 mg | ORAL_CAPSULE | Freq: Three times a day (TID) | ORAL | Status: DC
  Administered 2016-06-28 – 2016-06-29 (×5): 10 mg via ORAL
  Filled 2016-06-28 (×6): qty 1

## 2016-06-28 MED ORDER — OXYCODONE HCL 5 MG PO TABS
5.0000 mg | ORAL_TABLET | Freq: Four times a day (QID) | ORAL | Status: DC | PRN
  Administered 2016-06-28 – 2016-06-30 (×4): 5 mg via ORAL
  Filled 2016-06-28 (×4): qty 1

## 2016-06-28 MED ORDER — RIFAXIMIN 550 MG PO TABS
550.0000 mg | ORAL_TABLET | Freq: Two times a day (BID) | ORAL | Status: DC
  Administered 2016-06-28 – 2016-06-29 (×3): 550 mg via ORAL
  Filled 2016-06-28 (×5): qty 1

## 2016-06-28 MED ORDER — ALPRAZOLAM 0.5 MG PO TABS
1.0000 mg | ORAL_TABLET | ORAL | Status: AC
  Administered 2016-06-28: 1 mg via ORAL

## 2016-06-28 MED ORDER — SODIUM CHLORIDE 0.9 % IV SOLN
8.0000 mg/h | INTRAVENOUS | Status: DC
  Administered 2016-06-28 – 2016-06-29 (×3): 8 mg/h via INTRAVENOUS
  Filled 2016-06-28 (×3): qty 80

## 2016-06-28 MED ORDER — ALPRAZOLAM 0.5 MG PO TABS
ORAL_TABLET | ORAL | Status: AC
  Administered 2016-06-28: 1 mg via ORAL
  Filled 2016-06-28: qty 2

## 2016-06-28 MED ORDER — PANTOPRAZOLE SODIUM 40 MG IV SOLR: 40.0000 mg | Freq: Two times a day (BID) | INTRAVENOUS | Status: DC

## 2016-06-28 MED ORDER — IOPAMIDOL (ISOVUE-300) INJECTION 61%
15.0000 mL | INTRAVENOUS | Status: AC
  Administered 2016-06-28: 30 mL via ORAL

## 2016-06-28 MED ORDER — INTRINSI B12-FOLATE 800-500-20 MCG-MCG-MG PO TABS
1.0000 | ORAL_TABLET | Freq: Every day | ORAL | Status: DC
  Filled 2016-06-28 (×3): qty 1

## 2016-06-28 MED ORDER — ACETAMINOPHEN 325 MG PO TABS: 650.0000 mg | ORAL_TABLET | Freq: Four times a day (QID) | ORAL | Status: DC | PRN

## 2016-06-28 MED ORDER — VITAMIN B-12 1000 MCG PO TABS
2000.0000 ug | ORAL_TABLET | Freq: Every day | ORAL | Status: DC
  Administered 2016-06-29 – 2016-06-30 (×2): 2000 ug via ORAL
  Filled 2016-06-28 (×2): qty 2

## 2016-06-28 MED ORDER — FENTANYL CITRATE (PF) 100 MCG/2ML IJ SOLN
50.0000 ug | Freq: Once | INTRAMUSCULAR | Status: AC
  Administered 2016-06-28: 50 ug via INTRAVENOUS
  Filled 2016-06-28: qty 2

## 2016-06-28 MED ORDER — ONDANSETRON HCL 4 MG PO TABS: 4.0000 mg | ORAL_TABLET | Freq: Four times a day (QID) | ORAL | Status: DC | PRN

## 2016-06-28 MED ORDER — LACTULOSE 10 GM/15ML PO SOLN
30.0000 g | Freq: Every day | ORAL | Status: DC
  Administered 2016-06-29 – 2016-06-30 (×2): 30 g via ORAL
  Filled 2016-06-28 (×2): qty 60

## 2016-06-28 MED ORDER — IOPAMIDOL (ISOVUE-300) INJECTION 61%
100.0000 mL | Freq: Once | INTRAVENOUS | Status: AC | PRN
  Administered 2016-06-28: 100 mL via INTRAVENOUS

## 2016-06-28 MED ORDER — ENOXAPARIN SODIUM 40 MG/0.4ML ~~LOC~~ SOLN
40.0000 mg | Freq: Two times a day (BID) | SUBCUTANEOUS | Status: DC
  Administered 2016-06-28: 40 mg via SUBCUTANEOUS
  Filled 2016-06-28: qty 0.4

## 2016-06-28 MED ORDER — INSULIN ASPART 100 UNIT/ML ~~LOC~~ SOLN
0.0000 [IU] | Freq: Every day | SUBCUTANEOUS | Status: DC
  Administered 2016-06-28: 2 [IU] via SUBCUTANEOUS
  Administered 2016-06-29: 3 [IU] via SUBCUTANEOUS
  Filled 2016-06-28 (×2): qty 1

## 2016-06-28 MED ORDER — CITALOPRAM HYDROBROMIDE 20 MG PO TABS
20.0000 mg | ORAL_TABLET | Freq: Every day | ORAL | Status: DC
  Administered 2016-06-29 – 2016-06-30 (×2): 20 mg via ORAL
  Filled 2016-06-28 (×3): qty 1

## 2016-06-28 MED ORDER — SODIUM CHLORIDE 0.9 % IV SOLN
80.0000 mg | Freq: Once | INTRAVENOUS | Status: AC
  Administered 2016-06-28: 80 mg via INTRAVENOUS
  Filled 2016-06-28: qty 80

## 2016-06-28 MED ORDER — IPRATROPIUM-ALBUTEROL 0.5-2.5 (3) MG/3ML IN SOLN: 3.0000 mL | RESPIRATORY_TRACT | Status: DC | PRN

## 2016-06-28 MED ORDER — GABAPENTIN 300 MG PO CAPS: 600.0000 mg | ORAL_CAPSULE | Freq: Four times a day (QID) | ORAL | Status: DC | PRN

## 2016-06-28 MED ORDER — VITAMIN D 1000 UNITS PO TABS
2000.0000 [IU] | ORAL_TABLET | Freq: Every day | ORAL | Status: DC
  Administered 2016-06-29 – 2016-06-30 (×2): 2000 [IU] via ORAL
  Filled 2016-06-28 (×2): qty 2

## 2016-06-28 MED ORDER — FUROSEMIDE 40 MG PO TABS
40.0000 mg | ORAL_TABLET | Freq: Every day | ORAL | Status: DC
  Administered 2016-06-29 – 2016-06-30 (×2): 40 mg via ORAL
  Filled 2016-06-28 (×3): qty 1

## 2016-06-28 MED ORDER — SPIRONOLACTONE 25 MG PO TABS
100.0000 mg | ORAL_TABLET | Freq: Every day | ORAL | Status: DC
  Administered 2016-06-29: 100 mg via ORAL
  Filled 2016-06-28: qty 4

## 2016-06-28 NOTE — ED Notes (Signed)
Pt stuck X 2 by this RN unsuccessful.

## 2016-06-28 NOTE — ED Notes (Signed)
Pt back from CT

## 2016-06-28 NOTE — Telephone Encounter (Signed)
Patient now states she wants to have the steroids as this makes the procedure not hurt so much after. She says her pcp is ok with this. Does not want to have a procedure without steroids

## 2016-06-28 NOTE — H&P (Signed)
[place] [facility] [place] NAME: Lori Mckenzie    MR#:  [id number]  DATE OF BIRTH:  Dec 29, 1965  DATE OF ADMISSION:  06/28/2016  PRIMARY CARE PHYSICIAN: Ashkin, Neldon Labella, MD   REQUESTING/REFERRING PHYSICIAN: Dr. Harvest Dark  CHIEF COMPLAINT:   Chief Complaint  Patient presents with  . Abdominal Pain    HISTORY OF PRESENT ILLNESS:  Lori Mckenzie  is a 51 y.o. female with a known history of Anxiety, chronic back pain, history of uterine cancer, chronic kidney disease stage III, hypertension, hypothyroidism, chronic liver disease, obstructive sleep apnea, morbid obesity, who presents to the hospital due to abdominal pain and noted to have melanotic stools. Patient says that she's been having abdominal pain and bloating ongoing part a past few days and noted that she had black tarry stools today. She called her gastroenterologist who then advised her to come to the ER for further evaluation. In the emergency room patient had a rectal exam done which was heme positive with melanotic stools. Hospitalist services were contacted further treatment and evaluation. She denies any chest pains, shortness of breath, fever, chills, cough, headache dizziness or any other associated symptoms.  PAST MEDICAL HISTORY:   Past Medical History:  Diagnosis Date  . Abdominal abscess 08/25/2014  . Acid reflux 08/10/2010  . Acute cervical myofascial strain 02/09/2016  . Anxiety   . Ascites   . Asthma   . Back pain   . Bile leak, postoperative 07/17/2014  . Brittle bone disease   . Cancer ([place])    Uteriine  ca 44yr ago partial hysterectomy  . Cervical disc disease   . Chronic kidney disease   . Collagen vascular disease ([place]    RA  3-4 yrs ago  . COPD (chronic obstructive pulmonary disease) [place]   . Diabetes [place] complication [place]   . GERD (gastroesophageal reflux disease)   . Hypertension   . Hypothyroidism   . Left upper quadrant pain 01/09/2014  . Major  depressive disorder with single episode 12/05/2011  . Major depressive disorder, single episode 12/05/2011  . Migraines   . NASH (nonalcoholic steatohepatitis)   . Respiratory infection    2/17  . [place] 09/18/2014  . Sleep apnea   . Sleep apnea   . Syncope 11/16/2014  . Thyroid disease   . TIA (transient ischemic attack)     PAST SURGICAL HISTORY:   Past Surgical History:  Procedure Laterality Date  . ABDOMINAL HYSTERECTOMY    . CHOLECYSTECTOMY N/A 07/15/2014   Procedure: LAPAROSCOPIC CHOLECYSTECTOMY with liver biopsy ;  Surgeon: MSherri Rad MD;  Location: [facility] ORS;  Service: General;  Laterality: N/A;  . COLONOSCOPY WITH PROPOFOL N/A 06/23/2014   Procedure: COLONOSCOPY WITH PROPOFOL;  Surgeon: MLollie Sails MD;  [facility];  Service: Endoscopy;  Laterality: N/A;  . ERCP N/A 07/16/2014   Procedure: ENDOSCOPIC RETROGRADE CHOLANGIOPANCREATOGRAPHY (ERCP);  Surgeon: MClarene Essex MD;  [facility];  Service: Endoscopy;  Laterality: N/A;  . ERCP N/A 10/03/2014   Procedure: ENDOSCOPIC RETROGRADE CHOLANGIOPANCREATOGRAPHY (ERCP);  Surgeon: PHulen Luster MD;  [facility];  Service: Gastroenterology;  Laterality: N/A;  . ESOPHAGOGASTRODUODENOSCOPY N/A 06/23/2014   Procedure: ESOPHAGOGASTRODUODENOSCOPY (EGD);  Surgeon: MLollie Sails MD;  [facility];  Service: Endoscopy;  Laterality: N/A;  . ESOPHAGOGASTRODUODENOSCOPY (EGD) WITH PROPOFOL N/A 12/01/2015   Procedure: ESOPHAGOGASTRODUODENOSCOPY (EGD) WITH PROPOFOL;  Surgeon: MLollie Sails MD;  [facility];  Service: Endoscopy;  Laterality: N/A;  .  TUBAL LIGATION    . WISDOM TOOTH EXTRACTION      SOCIAL HISTORY:   Social History  Substance Use Topics  . Smoking status: Current Every Day Smoker    Types: Cigarettes  . Smokeless tobacco: Never Used  . Alcohol use No     Comment: occ    FAMILY HISTORY:   Family History  Problem Relation Age of Onset  . Lung cancer  Mother   . Ulcers Father   . Emphysema Father   . Heart disease Sister   . Ulcers Sister   . Heart disease Brother     DRUG ALLERGIES:   Allergies  Allergen Reactions  . Tape Swelling  . Vicodin [Hydrocodone-Acetaminophen] Itching and Rash    REVIEW OF SYSTEMS:   Review of Systems  Constitutional: Negative for fever and weight loss.  HENT: Negative for congestion, nosebleeds and tinnitus.   Eyes: Negative for blurred vision, double vision and redness.  Respiratory: Negative for cough, hemoptysis and shortness of breath.   Cardiovascular: Negative for chest pain, orthopnea, leg swelling and PND.  Gastrointestinal: Positive for abdominal pain and melena. Negative for diarrhea, nausea and vomiting.  Genitourinary: Negative for dysuria, hematuria and urgency.  Musculoskeletal: Negative for falls and joint pain.  Neurological: Negative for dizziness, tingling, sensory change, focal weakness, seizures, weakness and headaches.  Endo/Heme/Allergies: Negative for polydipsia. Does not bruise/bleed easily.  Psychiatric/Behavioral: Negative for depression and memory loss. The patient is not nervous/anxious.     MEDICATIONS AT HOME:   Prior to Admission medications   Medication Sig Start Date End Date Taking? Authorizing Provider  ALPRAZolam Duanne Moron) 1 MG tablet Take 1 mg by mouth 3 (three) times daily.    Yes [provider]  Cholecalciferol (VITAMIN D-1000 MAX ST) 1000 units tablet Take 2,000 Units by mouth daily.    Yes [provider]  citalopram (CELEXA) 20 MG tablet Take 20 mg by mouth daily.  07/24/15  Yes [provider]  diclofenac sodium (VOLTAREN) 1 % GEL Apply 4 g topically 4 (four) times daily. 04/12/16 10/09/16 Yes Milinda Pointer, MD  dicyclomine (BENTYL) 10 MG capsule Take 10 mg by mouth 4 (four) times daily -  before meals and at bedtime.    Yes [provider]  fluticasone (FLONASE) 50 MCG/ACT nasal spray Place 1-2 sprays into both  nostrils daily.    Yes [provider]  Folate-B12-Intrinsic Factor (INTRINSI B12-FOLATE) [phone] MCG-MCG-MG TABS Take 1 tablet by mouth daily. Reported on 04/06/2015   Yes [provider]  furosemide (LASIX) 40 MG tablet Take 40 mg by mouth daily.    Yes [provider]  gabapentin (NEURONTIN) 300 MG capsule Take 600 mg by mouth 4 (four) times daily as needed (for pain).    Yes [provider]  insulin aspart (NOVOLOG) 100 UNIT/ML injection 30 units with meals 3 times daily plus sliding scale. 06/03/16  Yes [provider]  Insulin Detemir (LEVEMIR FLEXPEN [place]) Inject 50 Units into the skin 2 (two) times daily.    Yes [provider]  ipratropium-albuterol (DUONEB) 0.5-2.5 (3) MG/3ML SOLN Take 3 mLs by nebulization every 4 (four) hours as needed.   Yes [provider]  lactulose (CHRONULAC) 10 GM/15ML solution Take 30 g by mouth daily.  03/13/15  Yes [provider]  naloxone Karma Greaser) 2 MG/2ML injection Inject into the vein as needed.   Yes [provider]  nystatin (MYCOSTATIN) powder Apply 1 g topically 3 (three) times daily as needed (for  irritation).    Yes [provider]  oxyCODONE (OXY IR/ROXICODONE) 5 MG immediate release tablet Take 5 mg by mouth every 6 (six) hours as needed for severe pain.   Yes [provider]  pantoprazole (PROTONIX) 40 MG tablet Take 40 mg by mouth daily.  08/31/15  Yes [provider]  potassium chloride SA (K-DUR,KLOR-CON) 20 MEQ tablet Take 10-20 mEq by mouth daily.    Yes [provider]  promethazine (PHENERGAN) 12.5 MG tablet Take 12.5 mg by mouth every 6 (six) hours as needed for nausea or vomiting.    Yes [provider]  rifaximin (XIFAXAN) 550 MG TABS tablet Take 550 mg by mouth 2 (two) times daily.    Yes [provider]  rizatriptan (MAXALT) 10 MG tablet Take 10 mg by mouth as needed for migraine.    Yes [provider]   spironolactone (ALDACTONE) 100 MG tablet Take 100 mg by mouth daily.    Yes [provider]  vitamin B-12 (CYANOCOBALAMIN) 1000 MCG tablet Take 2,000 mcg by mouth daily.   Yes [provider]      VITAL SIGNS:  Blood pressure 114/86, pulse 86, temperature 98.8 F (37.1 C), temperature source Oral, resp. rate 18, height 5' 2"  (1.575 m), weight 117.9 kg (260 lb), SpO2 95 %.  PHYSICAL EXAMINATION:  Physical Exam  GENERAL:  51 y.o.-year-old obese patient lying in the bed in no acute distress.  EYES: Pupils equal, round, reactive to light and accommodation. No scleral icterus. Extraocular muscles intact.  HEENT: Head atraumatic, normocephalic. Oropharynx and nasopharynx clear. No oropharyngeal erythema, moist oral mucosa  NECK:  Supple, no jugular venous distention. No thyroid enlargement, no tenderness.  LUNGS: Normal breath sounds bilaterally, no wheezing, rales, rhonchi. No use of accessory muscles of respiration.  CARDIOVASCULAR: S1, S2 RRR. No murmurs, rubs, gallops, clicks.  ABDOMEN: Soft, tender diffusely but no rebound, rigidity, nondistended. Bowel sounds present. No organomegaly or mass.  EXTREMITIES: No pedal edema, cyanosis, or clubbing. + 2 pedal & radial pulses b/l.   NEUROLOGIC: Cranial nerves II through XII are intact. No focal Motor or sensory deficits appreciated b/l PSYCHIATRIC: The patient is alert and oriented x 3.  SKIN: No obvious rash, lesion, or ulcer.   LABORATORY PANEL:   CBC  Recent Labs Lab 06/28/16 1610  WBC 5.8  HGB 16.0  HCT 46.4  PLT 92*   ------------------------------------------------------------------------------------------------------------------  Chemistries   Recent Labs Lab 06/28/16 1610  NA 135  K 3.7  CL 100*  CO2 22  GLUCOSE 351*  BUN 13  CREATININE 0.57  CALCIUM 9.3  AST 94*  ALT 105*  ALKPHOS 102  BILITOT 0.9    ------------------------------------------------------------------------------------------------------------------  Cardiac Enzymes No results for input(s): TROPONINI in the last 168 hours. ------------------------------------------------------------------------------------------------------------------  RADIOLOGY:  Ct Abdomen Pelvis W Contrast  Result Date: 06/28/2016 CLINICAL DATA:  Right-sided abdominal pain and swelling for 2-3 days with nausea and vomiting. Prior cholecystectomy. EXAM: CT ABDOMEN AND PELVIS WITH CONTRAST TECHNIQUE: Multidetector CT imaging of the abdomen and pelvis was performed using the standard protocol following bolus administration of intravenous contrast. CONTRAST:  144m ISOVUE-300 IOPAMIDOL (ISOVUE-300) INJECTION 61% COMPARISON:  06/24/2015.  10/04/2014. FINDINGS: Lower chest:  Unremarkable. Hepatobiliary: Liver measures 25.4 cm craniocaudal length. Nodular liver contour compatible with cirrhosis. 2.1 cm hypoattenuating lesion along the falciform ligament is stable since 2016. 6 mm hypoattenuating lesion lateral segment left liver was also present on the study from nearly 2 years ago and is stable. Tiny hypodensities  towards the hepatic dome and in the medial segment left liver are also unchanged. No enhancing liver lesion. Gallbladder surgically absent. Pneumobilia is compatible with prior sphincterotomy in 2016. No intra or extrahepatic biliary duct dilatation. Pancreas: No focal mass lesion. No dilatation of the main duct. No intraparenchymal cyst. No peripancreatic edema. Spleen: Enlarged at 20 cm craniocaudal length. No focal mass lesion. Adrenals/Urinary Tract: Right adrenal gland is normal. Tiny left adrenal nodule is stable. Kidneys are unremarkable. No evidence for hydroureter. The urinary bladder appears normal for the degree of distention. Stomach/Bowel: Stomach is nondistended. No gastric wall thickening. No evidence of outlet obstruction. Duodenum is normally  positioned as is the ligament of Treitz. No small bowel wall thickening. No small bowel dilatation. The terminal ileum is normal. The appendix is normal. No gross colonic mass. No colonic wall thickening. No substantial diverticular change. Vascular/Lymphatic: There is abdominal aortic atherosclerosis without aneurysm. Portal vein and superior mesenteric vein are patent. Splenic vein is patent but prominent. No gastrohepatic or hepatoduodenal ligament lymphadenopathy. No retroperitoneal lymphadenopathy. No pelvic lymphadenopathy Reproductive: Uterus surgically absent.  There is no adnexal mass. Other: No intraperitoneal free fluid. Musculoskeletal: Bone windows reveal no worrisome lytic or sclerotic osseous lesions. Tiny paraumbilical ventral hernia contains only fat. IMPRESSION: 1. No acute findings in the abdomen or pelvis. 2. Hepatomegaly. Nodular liver contour compatible with cirrhosis. Multiple hypoattenuating lesions in the liver parenchyma are stable back to 2016 and are compatible with benign etiology, likely cysts. 3. Prominent portal, superior mesenteric, and splenic veins, are associated with splenomegaly raising concern for portal venous hypertension. Electronically Signed   By: Misty Stanley M.D.   On: 06/28/2016 19:32     IMPRESSION AND PLAN:   51 year old female with past medical history of chronic liver disease, obesity, obstructive sleep apnea, chronic kidney disease stage III, hypertension, hypothyroidism, GERD, diabetes, COPD, history of previous CVA, who presents to the hospital due to abdominal pain and noted to have melanotic stools.  1. GI bleed-suspected to be upper GI bleed given the patient's melanotic stools. -Hemoglobin currently is stable. We'll observe overnight, follow serial hemoglobins. -Get a gastroenterology consult for monitoring. Place on clear liquid diet, make nothing by mouth after midnight. Possible endoscopy tomorrow. -Placed on IV Protonix.  2. Abdominal  pain-etiology unclear. CT scan abdomen pelvis showing hepatomegaly with some cirrhosis but no evidence of acute intra-abdominal pathology. Continue supportive care for now.  3. Diabetes type II without complication-hold scheduled insulin. Place on sliding scale insulin for now.  4. Hx of Chronic Liver Disease - due to nonalcoholic steatohepatitis. -No evidence of hepatic encephalopathy. Continue lactulose, Xifaxan.  5. GERD-continue Protonix.  6. Depression-continue Celexa.  7. Diabetic neuropathy-continue gabapentin.  All the records are reviewed and case discussed with ED provider. Management plans discussed with the patient, family and they are in agreement.  CODE STATUS: Full code  TOTAL TIME TAKING CARE OF THIS PATIENT: 45 minutes.    Henreitta Leber M.D on 06/28/2016 at 8:30 PM  Between 7am to 6pm - Pager - [phone]  After 6pm go to [email or web address] - password [email or web address] [facility]  Office  [phone]  CC: Primary care physician; Ashkin, Neldon Labella, MD

## 2016-06-28 NOTE — ED Triage Notes (Signed)
Pt has abd pain for 2 days.  Pt reports nausea.  States pain radiates into back.  No diff urinating.  Pt also reports blood in stools .  Pt alert.  Speech clear.

## 2016-06-28 NOTE — ED Notes (Signed)
Black stool since yesterday. Hx of GI bleeding. Nausea and emesis X 1 in lobby. Pt alert and oriented X4, active, cooperative, pt in NAD. RR even and unlabored, color WNL.

## 2016-06-28 NOTE — ED Provider Notes (Signed)
[facility] Emergency Department Provider Note  Time seen: 5:31 PM  I have reviewed the triage vital signs and the nursing notes.   HISTORY  Chief Complaint Abdominal Pain    HPI Lori Mckenzie is a 51 y.o. female with multiple medical issues including severe gastric reflux for which she sees GI medicine, presents to the emergency department for 2 days of upper abdominal pain and black stool. According to the patient for the past 2 days her stool is been very dark in color, and she has been expressing pain in her upper abdomen/epigastrium. Denies any blood thinners. Patient does state nausea with occasional vomiting over the past 2 days as well but denies any black or bloody vomitus. Denies any fever, dysuria. Patient describes her epigastric discomfort as mild-to-moderate currently dull aching in quality  Past Medical History:  Diagnosis Date  . Abdominal abscess 08/25/2014  . Acid reflux 08/10/2010  . Acute cervical myofascial strain 02/09/2016  . Anxiety   . Ascites   . Asthma   . Back pain   . Bile leak, postoperative 07/17/2014  . Brittle bone disease   . Cancer ([place])    Uteriine  ca 20yr ago partial hysterectomy  . Cervical disc disease   . Chronic kidney disease   . Collagen vascular disease ([place]    RA  3-4 yrs ago  . COPD (chronic obstructive pulmonary disease) ([place]   . Diabetes mellitus without complication ([place]   . GERD (gastroesophageal reflux disease)   . Hypertension   . Hypothyroidism   . Left upper quadrant pain 01/09/2014  . Major depressive disorder with single episode 12/05/2011  . Major depressive disorder, single episode 12/05/2011  . Migraines   . NASH (nonalcoholic steatohepatitis)   . Respiratory infection    2/17  . Shock ([place] 09/18/2014  . Sleep apnea   . Sleep apnea   . Syncope 11/16/2014  . Thyroid disease   . TIA (transient ischemic attack)     Patient Active Problem List   Diagnosis Date Noted  . Constipation 06/20/2016   . Hyperglycemia 06/20/2016  . Nausea without vomiting 06/06/2016  . Chronic pain of lower extremity, bilateral 05/19/2016  . Spinal stenosis, thoracic region (T10-11) 05/19/2016  . Diabetes mellitus, insulin dependent (IDDM), uncontrolled ([place] 04/12/2016  . Chronic sacroiliac joint pain (Bilateral) (L>R) 03/23/2016  . Chronic pain of left upper extremity 03/10/2016  . Chronic radicular cervical pain (L) 03/10/2016  . Osteoarthritis 02/24/2016  . Allodynia 02/09/2016  . Chronic pain syndrome 01/07/2016  . Opiate withdrawal ([place] 12/22/2015  . Elevated liver enzymes 09/28/2015  . Cirrhosis of liver without ascites ([place] 09/24/2015  . Cirrhosis ([place] 09/24/2015  . Altered mental status 07/24/2015  . Uncontrolled diabetes mellitus ([place] 07/24/2015  . Radicular pain of thoracic region 04/06/2015  . Hepatic encephalopathy ([place] 03/11/2015  . Elevated sedimentation rate 03/05/2015  . Elevated C-reactive protein (CRP) 03/05/2015  . Lumbar facet syndrome (Location of Primary Source of Pain) (Bilateral) (R>L) 03/05/2015  . Cervical spondylosis 03/05/2015  . Chronic feet pain (Location of Secondary source of pain) (Bilateral) (R>L) 03/05/2015  . Lumbar spondylosis 03/05/2015  . Encounter for chronic pain management 03/05/2015  . Chronic shoulder pain (Bilateral) (R>L) 03/05/2015  . Chronic carpal tunnel syndrome (Bilateral) 03/05/2015  . Chronic hip pain (Bilateral) (L>R) 03/05/2015  . Chronic upper back pain (Bilateral) (L>R) 03/05/2015  . Osteoporosis, idiopathic 03/05/2015  . Abnormal MRI, lumbar spine (02/03/2015) 03/05/2015  . Thoracic radiculitis 02/05/2015  . Vitamin D  deficiency 01/13/2015  . B12 deficiency 01/13/2015  . Folate deficiency 01/13/2015  . Subacute lumbar radiculopathy (left side) (S1 dermatome) 12/10/2014  . Drowsiness 11/16/2014  . Episode of syncope 11/16/2014  . Somnolence 11/16/2014  . Opiate use (75 MME/Day) 10/28/2014  . Long term prescription opiate use  10/28/2014  . Long term current use of opiate analgesic 10/28/2014  . Encounter for therapeutic drug level monitoring 10/28/2014  . Chronic epigastric abdominal pain 10/28/2014  . Chronic low back pain (Location of Primary Source of Pain) (Bilateral) (R>L) 10/28/2014  . Chronic neck pain (Location of Tertiary source of pain) (Bilateral) (R>L) 10/28/2014  . Ascites 09/05/2014  . NASH (nonalcoholic steatohepatitis) 09/05/2014  . Hypokalemia 09/05/2014  . Dysthymia 08/05/2014  . Other social stressor 08/05/2014  . Steatohepatitis 07/15/2014  . Type 2 diabetes mellitus ([place]) 07/12/2014  . COPD (chronic obstructive pulmonary disease) ([place]) 07/12/2014  . GERD (gastroesophageal reflux disease) 07/12/2014  . OSA on CPAP 07/12/2014  . Anxiety 07/12/2014  . 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 Lumbar and sacral osteoarthritis 03/26/2014  . Myofascial pain 03/26/2014  . Lumbar spinal stenosis 03/26/2014  . Lumbosacral spondylosis without myelopathy 03/26/2014  . 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 Spinal stenosis, lumbar region without neurogenic claudication (L1-2 and L4-5) 03/26/2014  . Spondylosis of lumbar region without myelopathy or radiculopathy 03/26/2014  . Breath shortness 01/09/2014  . Diastolic dysfunction 22/29/7989  . Airway hyperreactivity 08/06/2013  . Essential (primary) hypertension 08/06/2013  . Asthma 08/06/2013  . Clinical depression 12/05/2011    Past Surgical History:  Procedure Laterality Date  . ABDOMINAL HYSTERECTOMY    . CHOLECYSTECTOMY N/A 07/15/2014   Procedure: LAPAROSCOPIC CHOLECYSTECTOMY with liver biopsy ;  Surgeon: Sherri Rad, MD;  Location: [facility] ORS;  Service: General;  Laterality: N/A;  . COLONOSCOPY WITH PROPOFOL N/A 06/23/2014   Procedure: COLONOSCOPY WITH PROPOFOL;  Surgeon: Lollie Sails, MD;  Location: [facility] ENDOSCOPY;  Service: Endoscopy;  Laterality: N/A;  . ERCP N/A 07/16/2014   Procedure: ENDOSCOPIC RETROGRADE  CHOLANGIOPANCREATOGRAPHY (ERCP);  Surgeon: Clarene Essex, MD;  Location: [facility] ENDOSCOPY;  Service: Endoscopy;  Laterality: N/A;  . ERCP N/A 10/03/2014   Procedure: ENDOSCOPIC RETROGRADE CHOLANGIOPANCREATOGRAPHY (ERCP);  Surgeon: Hulen Luster, MD;  Location: [facility] ENDOSCOPY;  Service: Gastroenterology;  Laterality: N/A;  . ESOPHAGOGASTRODUODENOSCOPY N/A 06/23/2014   Procedure: ESOPHAGOGASTRODUODENOSCOPY (EGD);  Surgeon: Lollie Sails, MD;  Location: [facility] ENDOSCOPY;  Service: Endoscopy;  Laterality: N/A;  . ESOPHAGOGASTRODUODENOSCOPY (EGD) WITH PROPOFOL N/A 12/01/2015   Procedure: ESOPHAGOGASTRODUODENOSCOPY (EGD) WITH PROPOFOL;  Surgeon: Lollie Sails, MD;  Location: [facility] ENDOSCOPY;  Service: Endoscopy;  Laterality: N/A;  . TUBAL LIGATION    . WISDOM TOOTH EXTRACTION      Prior to Admission medications   Medication Sig Start Date End Date Taking? Authorizing Provider  albuterol (ACCUNEB) 0.63 MG/3ML nebulizer solution Inhale 1 ampule by nebulization every six (6) hours as needed for wheezing.    [provider]  ALPRAZolam Duanne Moron) 1 MG tablet Take 1-2 mg by mouth 3 (three) times daily as needed for anxiety.    [provider]  Cholecalciferol (VITAMIN D-1000 MAX ST) 1000 units tablet Take 2,000 Units by mouth daily.     [provider]  citalopram (CELEXA) 20 MG tablet Take 20 mg by mouth daily.  07/24/15   [provider]  diclofenac sodium (VOLTAREN) 1 % GEL Apply 4 g topically 4 (four) times daily. 04/12/16 10/09/16  Milinda Pointer, MD  dicyclomine (BENTYL) 10 MG capsule  Take 10 mg by mouth 4 (four) times daily -  before meals and at bedtime.     [provider]  fluticasone (FLONASE) 50 MCG/ACT nasal spray Place 1-2 sprays into both nostrils daily as needed for rhinitis.     [provider]  Folate-B12-Intrinsic Factor (INTRINSI B12-FOLATE) 619-509-32 MCG-MCG-MG TABS Take 1 tablet by mouth daily. Reported on 04/06/2015    [provider]  furosemide (LASIX) 40 MG tablet Take 80 mg by mouth as needed.     [provider]  gabapentin (NEURONTIN) 300 MG capsule Take 900 mg by mouth 4 (four) times daily as needed (for pain).     [provider]  insulin aspart (NOVOLOG) 100 UNIT/ML injection 20 units with meals 3 times daily plus sliding scale.  MDD 80 units. 06/03/16   [provider]  Insulin Detemir (LEVEMIR FLEXPEN [place]) Inject 50 Units into the skin 2 (two) times daily.     [provider]  ipratropium (ATROVENT) 0.02 % nebulizer solution Inhale 500 mcg by nebulization Four (4) times a day.    [provider]  lactulose (CHRONULAC) 10 GM/15ML solution Take 30 g by mouth 3 (three) times daily as needed.  03/13/15   [provider]  naloxone Karma Greaser) 2 MG/2ML injection Inject into the vein as needed.    [provider]  nystatin (MYCOSTATIN) powder Apply 1 g topically 3 (three) times daily as needed (for irritation).     [provider]  pantoprazole (PROTONIX) 40 MG tablet Take 40 mg by mouth daily.  08/31/15   [provider]  potassium chloride SA (K-DUR,KLOR-CON) 20 MEQ tablet Take 20 mEq by mouth as needed.    [provider]  promethazine (PHENERGAN) 12.5 MG tablet Take 12.5 mg by mouth every 6 (six) hours as needed for nausea or vomiting.     [provider]  rifaximin (XIFAXAN) 550 MG TABS tablet Take 550 mg by mouth 2 (two) times daily.     [provider]  rizatriptan (MAXALT) 10 MG tablet Take 10 mg by mouth as needed for migraine.     [provider]  spironolactone (ALDACTONE) 100 MG tablet Take 100 mg by mouth daily.     [provider]  vitamin B-12 (CYANOCOBALAMIN) 1000 MCG tablet Take 2,000 mcg by mouth daily.    [provider]    Allergies  Allergen Reactions  . Tape Swelling  . Vicodin [Hydrocodone-Acetaminophen] Itching and Rash    Family History  Problem Relation Age of  Onset  . Lung cancer Father   . Ulcers Father   . Heart disease Sister   . Ulcers Sister   . Heart disease Brother     Social History Social History  Substance Use Topics  . Smoking status: Never Smoker  . Smokeless tobacco: Never Used  . Alcohol use No     Comment: occ    Review of Systems Constitutional: Negative for fever. Cardiovascular: Negative for chest pain. Respiratory: Negative for shortness of breath. Gastrointestinal: Moderate epigastric pain with nausea and vomiting. Negative for diarrhea but positive for black stool 2 days. Genitourinary: Negative for dysuria. Musculoskeletal: Negative for back pain. Neurological: Negative for headache All other ROS negative  ____________________________________________   PHYSICAL EXAM:  VITAL SIGNS: ED Triage Vitals  Enc Vitals Group     BP 06/28/16 1611 (!) 141/66     Pulse Rate 06/28/16 1611 (!) 126     Resp 06/28/16 1611 20  Temp 06/28/16 1611 98.8 F (37.1 C)     Temp Source 06/28/16 1611 Oral     SpO2 06/28/16 1611 96 %     Weight 06/28/16 1612 260 lb (117.9 kg)     Height 06/28/16 1612 5' 2"  (1.575 m)     Head Circumference --      Peak Flow --      Pain Score 06/28/16 1611 9     Pain Loc --      Pain Edu? --      Excl. in [place]? --     Constitutional: Alert and oriented. Well appearing and in no distress. Eyes: Normal exam ENT   Head: Normocephalic and atraumatic.   Mouth/Throat: Mucous membranes are moist. Cardiovascular: Normal rate, regular rhythm. No murmur Respiratory: Normal respiratory effort without tachypnea nor retractions. Breath sounds are clear Gastrointestinal: Soft, mild to moderate fairly diffuse epigastric and upper abdominal tenderness palpation. No rebound or guarding. No distention. Musculoskeletal: Nontender with normal range of motion in all extremities.  Neurologic:  Normal speech and language. No gross focal neurologic deficits Skin:  Skin is warm, dry and intact.   Psychiatric: Mood and affect are normal.   ____________________________________________     RADIOLOGY  IMPRESSION: 1. No acute findings in the abdomen or pelvis. 2. Hepatomegaly. Nodular liver contour compatible with cirrhosis. Multiple hypoattenuating lesions in the liver parenchyma are stable back to 2016 and are compatible with benign etiology, likely cysts. 3. Prominent portal, superior mesenteric, and splenic veins, are associated with splenomegaly raising concern for portal venous hypertension.  ____________________________________________   INITIAL IMPRESSION / ASSESSMENT AND PLAN / ED COURSE  Pertinent labs & imaging results that were available during my care of the patient were reviewed by me and considered in my medical decision making (see chart for details).  The patient presents to the emergency department for upper abdominal discomfort and nausea vomiting and dark stool. Patient's rectal exam shows hemorrhoids which do not appear to be actively bleeding, dark stool which is guaiac positive. Labs are largely within normal limits besides mild LFT elevation although the patient has a history of liver dysfunction currently taking lactulose daily. Normal H&H. We will start the patient on Protonix, treat pain and nausea, IV hydrate and obtain a CT scan of the abdomen/pelvis. Patient agreeable to plan.  Patient CT scan is largely negative/stable. Given the patient's dark stool we will keep the patient on Protonix and admitted to the hospital for further treatment.  ____________________________________________   FINAL CLINICAL IMPRESSION(S) / ED DIAGNOSES  GI bleed Upper abdominal pain    Harvest Dark, MD 06/28/16 1943

## 2016-06-28 NOTE — Telephone Encounter (Signed)
Tried to call patient back but no answer and no way to leave voicemail. Recording states "please try again later".

## 2016-06-28 NOTE — Progress Notes (Signed)
Order for enoxaparin 40 mg subQ daily changed to 40 mg subQ q12h per anticoagulation protocol for CrCl > 30 mL/min and BMI > 40.  Lenis Noon, PharmD 06/28/16 9:29 PM

## 2016-06-28 NOTE — ED Notes (Signed)
fsbs in triage 344

## 2016-06-29 DIAGNOSIS — K3189 Other diseases of stomach and duodenum: Secondary | ICD-10-CM | POA: Diagnosis not present

## 2016-06-29 DIAGNOSIS — K922 Gastrointestinal hemorrhage, unspecified: Secondary | ICD-10-CM | POA: Diagnosis not present

## 2016-06-29 DIAGNOSIS — I8500 Esophageal varices without bleeding: Secondary | ICD-10-CM | POA: Diagnosis not present

## 2016-06-29 DIAGNOSIS — K2970 Gastritis, unspecified, without bleeding: Secondary | ICD-10-CM | POA: Diagnosis not present

## 2016-06-29 DIAGNOSIS — K766 Portal hypertension: Secondary | ICD-10-CM | POA: Diagnosis not present

## 2016-06-29 LAB — BASIC METABOLIC PANEL
Anion gap: 4 — ABNORMAL LOW (ref 5–15)
BUN: 9 mg/dL (ref 6–20)
CO2: 28 mmol/L (ref 22–32)
Calcium: 8.4 mg/dL — ABNORMAL LOW (ref 8.9–10.3)
Chloride: 101 mmol/L (ref 101–111)
Creatinine, Ser: 0.6 mg/dL (ref 0.44–1.00)
GFR calc Af Amer: >60 mL/min (ref >60)
GFR calc non Af Amer: >60 mL/min (ref >60)
Glucose, Bld: 212 mg/dL — ABNORMAL HIGH (ref 65–99)
Potassium: 3.9 mmol/L (ref 3.5–5.1)
Sodium: 133 mmol/L — ABNORMAL LOW (ref 135–145)

## 2016-06-29 LAB — CBC
HCT: 40 % (ref 35.0–47.0)
Hemoglobin: 13.8 g/dL (ref 12.0–16.0)
MCH: 35.4 pg — ABNORMAL HIGH (ref 26.0–34.0)
MCHC: 34.5 g/dL (ref 32.0–36.0)
MCV: 102.4 fL — ABNORMAL HIGH (ref 80.0–100.0)
Platelets: 73 10*3/uL — ABNORMAL LOW (ref 150–440)
RBC: 3.9 MIL/uL (ref 3.80–5.20)
RDW: 13.9 % (ref 11.5–14.5)
WBC: 3.4 10*3/uL — ABNORMAL LOW (ref 3.6–11.0)

## 2016-06-29 LAB — GLUCOSE, CAPILLARY
Glucose-Capillary: 273 mg/dL — ABNORMAL HIGH (ref 65–99)
Glucose-Capillary: 188 mg/dL — ABNORMAL HIGH (ref 65–99)
Glucose-Capillary: 234 mg/dL — ABNORMAL HIGH (ref 65–99)
Glucose-Capillary: 277 mg/dL — ABNORMAL HIGH (ref 65–99)

## 2016-06-29 MED ORDER — FE FUMARATE-B12-VIT C-FA-IFC PO CAPS
1.0000 | ORAL_CAPSULE | Freq: Every day | ORAL | Status: DC
  Administered 2016-06-29: 1 via ORAL
  Filled 2016-06-29 (×2): qty 1

## 2016-06-29 MED ORDER — SODIUM CHLORIDE 0.9 % IV SOLN
INTRAVENOUS | Status: DC
  Administered 2016-06-29: 21:00:00 via INTRAVENOUS
  Administered 2016-06-30: 20 mL/h via INTRAVENOUS

## 2016-06-29 MED ORDER — MORPHINE SULFATE (PF) 2 MG/ML IV SOLN
1.0000 mg | Freq: Four times a day (QID) | INTRAVENOUS | Status: DC | PRN
  Administered 2016-06-30 (×2): 1 mg via INTRAVENOUS
  Filled 2016-06-29 (×2): qty 1

## 2016-06-29 MED ORDER — FLUTICASONE PROPIONATE 50 MCG/ACT NA SUSP
1.0000 | Freq: Every day | NASAL | Status: DC
  Administered 2016-06-29: 2 via NASAL
  Administered 2016-06-30: 1 via NASAL
  Filled 2016-06-29: qty 16

## 2016-06-29 MED ORDER — INSULIN GLARGINE 100 UNIT/ML ~~LOC~~ SOLN
30.0000 [IU] | Freq: Two times a day (BID) | SUBCUTANEOUS | Status: DC
  Administered 2016-06-29 – 2016-06-30 (×2): 30 [IU] via SUBCUTANEOUS
  Filled 2016-06-29 (×4): qty 0.3

## 2016-06-29 MED ORDER — MORPHINE SULFATE (PF) 2 MG/ML IV SOLN
2.0000 mg | Freq: Four times a day (QID) | INTRAVENOUS | Status: DC | PRN
  Administered 2016-06-29 (×2): 2 mg via INTRAVENOUS
  Filled 2016-06-29 (×2): qty 1

## 2016-06-29 NOTE — Consult Note (Signed)
Jonathon Bellows MD, MRCP(U.K) [address]  [place], [place] [address]  Main: [phone]  Fax: [phone]  Consultation  Referring Provider:   Dr Posey Pronto  Primary Care Physician:  [name] Neldon Labella, MD Primary Gastroenterologist:  [facility]          Reason for Consultation:     Melena   Date of Admission:  06/28/2016 Date of Consultation:  06/29/2016         HPI:   Lori Mckenzie is a 51 y.o. female who is known to the [facility] was seen at their office on 06/28/16 and the patient reported to have had 3 black stools and referred to the ER. She has a history of NAFLD with cirrhosis. EGD in 11/2015 showed PHG but no esophageal varices.   CT abdomen 06/28/16- cirrhosis of the liver.  Hb on admission was 16 grams  She complains of abdominal pain for the past few days.   She says last few days having upper abdominal bloating,gas,discomfort and constpation. Noticed to have black tarry stools yesterday x4 , last episode was yesterday , no hematemesis , no NSAID use. Presently no abdominal pain or bloody bowel movement . On lactulose for encephelopathy and titrates it to 5 bowel movements a day .   Past Medical History:  Diagnosis Date  . Abdominal abscess 08/25/2014  . Acid reflux 08/10/2010  . Acute cervical myofascial strain 02/09/2016  . Anxiety   . Ascites   . Asthma   . Back pain   . Bile leak, postoperative 07/17/2014  . Brittle bone disease   . Cancer ([place])    Uteriine  ca 62yr ago partial hysterectomy  . Cervical disc disease   . Chronic kidney disease   . Collagen vascular disease ([place]    RA  3-4 yrs ago  . COPD (chronic obstructive pulmonary disease) ([facility]   . Diabetes mellitus without complication ([place]   . GERD (gastroesophageal reflux disease)   . Hypertension   . Hypothyroidism   . Left upper quadrant pain 01/09/2014  . Major depressive disorder with single episode 12/05/2011  . Major depressive disorder, single episode 12/05/2011  . Migraines   .  NASH (nonalcoholic steatohepatitis)   . Respiratory infection    2/17  . Shock ([place] 09/18/2014  . Sleep apnea   . Sleep apnea   . Syncope 11/16/2014  . Thyroid disease   . TIA (transient ischemic attack)     Past Surgical History:  Procedure Laterality Date  . ABDOMINAL HYSTERECTOMY    . CHOLECYSTECTOMY N/A 07/15/2014   Procedure: LAPAROSCOPIC CHOLECYSTECTOMY with liver biopsy ;  Surgeon: MSherri Rad MD;  Location: [facility] ORS;  Service: General;  Laterality: N/A;  . COLONOSCOPY WITH PROPOFOL N/A 06/23/2014   Procedure: COLONOSCOPY WITH PROPOFOL;  Surgeon: MLollie Sails MD;  [facility];  Service: Endoscopy;  Laterality: N/A;  . ERCP N/A 07/16/2014   Procedure: ENDOSCOPIC RETROGRADE CHOLANGIOPANCREATOGRAPHY (ERCP);  Surgeon: MClarene Essex MD;  [facility];  Service: Endoscopy;  Laterality: N/A;  . ERCP N/A 10/03/2014   Procedure: ENDOSCOPIC RETROGRADE CHOLANGIOPANCREATOGRAPHY (ERCP);  Surgeon: PHulen Luster MD;  [facility];  Service: Gastroenterology;  Laterality: N/A;  . ESOPHAGOGASTRODUODENOSCOPY N/A 06/23/2014   Procedure: ESOPHAGOGASTRODUODENOSCOPY (EGD);  Surgeon: MLollie Sails MD;  [facility];  Service: Endoscopy;  Laterality: N/A;  . ESOPHAGOGASTRODUODENOSCOPY (EGD) WITH PROPOFOL N/A 12/01/2015   Procedure: ESOPHAGOGASTRODUODENOSCOPY (EGD) WITH PROPOFOL;  Surgeon: MLollie Sails MD;  [facility];  Service: Endoscopy;  Laterality: N/A;  . TUBAL LIGATION    . WISDOM TOOTH EXTRACTION      Prior to Admission medications   Medication Sig Start Date End Date Taking? Authorizing Provider  ALPRAZolam Duanne Moron) 1 MG tablet Take 1 mg by mouth 3 (three) times daily.    Yes [provider]  Cholecalciferol (VITAMIN D-1000 MAX ST) 1000 units tablet Take 2,000 Units by mouth daily.    Yes [provider]  citalopram (CELEXA) 20 MG tablet Take 20 mg by mouth daily.  07/24/15  Yes [provider]    diclofenac sodium (VOLTAREN) 1 % GEL Apply 4 g topically 4 (four) times daily. 04/12/16 10/09/16 Yes Milinda Pointer, MD  dicyclomine (BENTYL) 10 MG capsule Take 10 mg by mouth 4 (four) times daily -  before meals and at bedtime.    Yes [provider]  fluticasone (FLONASE) 50 MCG/ACT nasal spray Place 1-2 sprays into both nostrils daily.    Yes [provider]  Folate-B12-Intrinsic Factor (INTRINSI B12-FOLATE) 324-401-02 MCG-MCG-MG TABS Take 1 tablet by mouth daily. Reported on 04/06/2015   Yes [provider]  furosemide (LASIX) 40 MG tablet Take 40 mg by mouth daily.    Yes [provider]  gabapentin (NEURONTIN) 300 MG capsule Take 600 mg by mouth 4 (four) times daily as needed (for pain).    Yes [provider]  insulin aspart (NOVOLOG) 100 UNIT/ML injection 30 units with meals 3 times daily plus sliding scale. 06/03/16  Yes [provider]  Insulin Detemir (LEVEMIR FLEXPEN [place]) Inject 50 Units into the skin 2 (two) times daily.    Yes [provider]  ipratropium-albuterol (DUONEB) 0.5-2.5 (3) MG/3ML SOLN Take 3 mLs by nebulization every 4 (four) hours as needed.   Yes [provider]  lactulose (CHRONULAC) 10 GM/15ML solution Take 30 g by mouth daily.  03/13/15  Yes [provider]  naloxone Karma Greaser) 2 MG/2ML injection Inject into the vein as needed.   Yes [provider]  nystatin (MYCOSTATIN) powder Apply 1 g topically 3 (three) times daily as needed (for irritation).    Yes [provider]  oxyCODONE (OXY IR/ROXICODONE) 5 MG immediate release tablet Take 5 mg by mouth every 6 (six) hours as needed for severe pain.   Yes [provider]  pantoprazole (PROTONIX) 40 MG tablet Take 40 mg by mouth daily.  08/31/15  Yes [provider]  potassium chloride SA (K-DUR,KLOR-CON) 20 MEQ tablet Take 10-20 mEq by mouth daily.    Yes [provider]  promethazine (PHENERGAN) 12.5 MG  tablet Take 12.5 mg by mouth every 6 (six) hours as needed for nausea or vomiting.    Yes [provider]  rifaximin (XIFAXAN) 550 MG TABS tablet Take 550 mg by mouth 2 (two) times daily.    Yes [provider]  rizatriptan (MAXALT) 10 MG tablet Take 10 mg by mouth as needed for migraine.    Yes [provider]  spironolactone (ALDACTONE) 100 MG tablet Take 100 mg by mouth daily.    Yes [provider]  vitamin B-12 (CYANOCOBALAMIN) 1000 MCG tablet Take 2,000 mcg by mouth daily.   Yes [provider]    Family History  Problem Relation Age of Onset  . Lung cancer Mother   . Ulcers Father   . Emphysema Father   . Heart disease Sister   . Ulcers Sister   . Heart disease Brother  Social History  Substance Use Topics  . Smoking status: Current Every Day Smoker    Types: Cigarettes  . Smokeless tobacco: Never Used  . Alcohol use No     Comment: occ    Allergies as of 06/28/2016 - Review Complete 06/28/2016  Allergen Reaction Noted  . Tape Swelling 10/03/2014  . Vicodin [hydrocodone-acetaminophen] Itching and Rash 01/07/2011    Review of Systems:    All systems reviewed and negative except where noted in HPI.   Physical Exam:  Vital signs in last 24 hours: Temp:  [97.7 F (36.5 C)-98.8 F (37.1 C)] 98.2 F (36.8 C) (06/27 1340) Pulse Rate:  [75-126] 75 (06/27 1340) Resp:  [16-21] 21 (06/27 0532) BP: (101-141)/(58-93) 101/58 (06/27 1340) SpO2:  [94 %-97 %] 94 % (06/27 1340) Weight:  [260 lb (117.9 kg)] 260 lb (117.9 kg) (06/26 1612) Last BM Date: 06/28/16 General:   Pleasant, cooperative in NAD. Obese , short neck  Head:  Normocephalic and atraumatic. Eyes:   No icterus.   Conjunctiva pink. PERRLA. Ears:  Normal auditory acuity. Neck:  Supple; no masses or thyroidomegaly Lungs: Respirations even and unlabored. Lungs clear to auscultation bilaterally.   No wheezes, crackles, or rhonchi.  Heart:  Regular rate and rhythm;   Without murmur, clicks, rubs or gallops Abdomen:  Soft, nondistended, nontender. Normal bowel sounds. No appreciable masses or hepatomegaly.  No rebound or guarding.  Rectal:  Not performed. Neurologic:  Alert and oriented x3;  grossly normal neurologically. Skin:  Intact without significant lesions or rashes. Cervical Nodes:  No significant cervical adenopathy. Psych:  Alert and cooperative. Normal affect.  LAB RESULTS:  Recent Labs  06/28/16 1610 06/29/16 0925  WBC 5.8 3.4*  HGB 16.0 13.8  HCT 46.4 40.0  PLT 92* 73*   BMET  Recent Labs  06/28/16 1610 06/29/16 0925  NA 135 133*  K 3.7 3.9  CL 100* 101  CO2 22 28  GLUCOSE 351* 212*  BUN 13 9  CREATININE 0.57 0.60  CALCIUM 9.3 8.4*   LFT  Recent Labs  06/28/16 1610  PROT 7.2  ALBUMIN 4.1  AST 94*  ALT 105*  ALKPHOS 102  BILITOT 0.9   PT/INR No results for input(s): LABPROT, INR in the last 72 hours.  STUDIES: Ct Abdomen Pelvis W Contrast  Result Date: 06/28/2016 CLINICAL DATA:  Right-sided abdominal pain and swelling for 2-3 days with nausea and vomiting. Prior cholecystectomy. EXAM: CT ABDOMEN AND PELVIS WITH CONTRAST TECHNIQUE: Multidetector CT imaging of the abdomen and pelvis was performed using the standard protocol following bolus administration of intravenous contrast. CONTRAST:  17m ISOVUE-300 IOPAMIDOL (ISOVUE-300) INJECTION 61% COMPARISON:  06/24/2015.  10/04/2014. FINDINGS: Lower chest:  Unremarkable. Hepatobiliary: Liver measures 25.4 cm craniocaudal length. Nodular liver contour compatible with cirrhosis. 2.1 cm hypoattenuating lesion along the falciform ligament is stable since 2016. 6 mm hypoattenuating lesion lateral segment left liver was also present on the study from nearly 2 years ago and is stable. Tiny hypodensities towards the hepatic dome and in the medial segment left liver are also unchanged. No enhancing liver lesion. Gallbladder surgically absent. Pneumobilia is compatible with prior  sphincterotomy in 2016. No intra or extrahepatic biliary duct dilatation. Pancreas: No focal mass lesion. No dilatation of the main duct. No intraparenchymal cyst. No peripancreatic edema. Spleen: Enlarged at 20 cm craniocaudal length. No focal mass lesion. Adrenals/Urinary Tract: Right adrenal gland is normal. Tiny left adrenal nodule is stable. Kidneys are unremarkable. No evidence for hydroureter. The urinary bladder  appears normal for the degree of distention. Stomach/Bowel: Stomach is nondistended. No gastric wall thickening. No evidence of outlet obstruction. Duodenum is normally positioned as is the ligament of Treitz. No small bowel wall thickening. No small bowel dilatation. The terminal ileum is normal. The appendix is normal. No gross colonic mass. No colonic wall thickening. No substantial diverticular change. Vascular/Lymphatic: There is abdominal aortic atherosclerosis without aneurysm. Portal vein and superior mesenteric vein are patent. Splenic vein is patent but prominent. No gastrohepatic or hepatoduodenal ligament lymphadenopathy. No retroperitoneal lymphadenopathy. No pelvic lymphadenopathy Reproductive: Uterus surgically absent.  There is no adnexal mass. Other: No intraperitoneal free fluid. Musculoskeletal: Bone windows reveal no worrisome lytic or sclerotic osseous lesions. Tiny paraumbilical ventral hernia contains only fat. IMPRESSION: 1. No acute findings in the abdomen or pelvis. 2. Hepatomegaly. Nodular liver contour compatible with cirrhosis. Multiple hypoattenuating lesions in the liver parenchyma are stable back to 2016 and are compatible with benign etiology, likely cysts. 3. Prominent portal, superior mesenteric, and splenic veins, are associated with splenomegaly raising concern for portal venous hypertension. Electronically Signed   By: Misty Stanley M.D.   On: 06/28/2016 19:32      Impression / Plan:   Lori Mckenzie is a 51 y.o. y/o female with cirrhosis of the liver secondary  to NAFLD admitted yesterday for melena . H/o PHG , no varices on her last EGD in 11/2015. She has had some abdominal pain last few days. Likely bloating and discomfort from gas production due to large qty of lactulose . History suggestive of UGIB likely secondary to [place].   Plan  1. IV PPI 2. EGD in the am  3. If has further evidence of a GI bleed commence on octreotide  4. Suggested to titrate lactulose at home to soft bowel movements consistency of mashed avocado 2-3 times a day and no more.   I have discussed alternative options, risks & benefits,  which include, but are not limited to, bleeding, infection, perforation,respiratory complication & drug reaction.  The patient agrees with this plan & written consent will be obtained.     Thank you for involving me in the care of this patient.      LOS: 0 days   Jonathon Bellows, MD  06/29/2016, 3:36 PM

## 2016-06-29 NOTE — Progress Notes (Signed)
Inpatient Diabetes Program Recommendations  AACE/ADA: New Consensus Statement on Inpatient Glycemic Control (2015)  Target Ranges:  Prepandial:   less than 140 mg/dL      Peak postprandial:   less than 180 mg/dL (1-2 hours)      Critically ill patients:  140 - 180 mg/dL   Results for Lori Mckenzie, Lori Mckenzie (MRN [id number]) as of 06/29/2016 10:07  Ref. Range 06/28/2016 21:41 06/29/2016 07:21  Glucose-Capillary Latest Ref Range: 65 - 99 mg/dL 249 (H) 273 (H)  Results for Lori Mckenzie, Lori Mckenzie (MRN [id number]) as of 06/29/2016 10:07  Ref. Range 06/28/2016 16:10 06/29/2016 09:25  Glucose Latest Ref Range: 65 - 99 mg/dL 351 (H) 212 (H)   Review of Glycemic Control  Diabetes history: DM2 Outpatient Diabetes medications: Levemir 50 units BID, Novolog 30 units TID with meals plus correction scale Current orders for Inpatient glycemic control: Novolog 0-9 units TID with meals, Novolog 0-5 units QHS  Inpatient Diabetes Program Recommendations: Insulin - Basal: Please consider ordering Levemir 18 units Q24H starting now (based on 117 kg x 0.15 units). Correction (SSI): Please consider increasing Novolog correction to Moderate scale (0-15 units) and change frequency of CBGs and Novolog to Q4H if patient will remain NPO. HgbA1C: Please consider ordering an A1C to evaluate glycemic control over the past 2-3 months.  Thanks, Barnie Alderman, RN, MSN, CDE Diabetes Coordinator Inpatient Diabetes Program [phone] (Team Pager from 8am to 5pm)

## 2016-06-29 NOTE — Progress Notes (Signed)
[facility] [place] NAME: Lori Mckenzie    MR#:  [id number]  DATE OF BIRTH:  02/21/1965  SUBJECTIVE:   Complains of bloating of abdomen. No bright red blood per rectum REVIEW OF SYSTEMS:   Review of Systems  Constitutional: Negative for chills, fever and weight loss.  HENT: Negative for ear discharge, ear pain and nosebleeds.   Eyes: Negative for blurred vision, pain and discharge.  Respiratory: Negative for sputum production, shortness of breath, wheezing and stridor.   Cardiovascular: Negative for chest pain, palpitations, orthopnea and PND.  Gastrointestinal: Positive for melena. Negative for abdominal pain, diarrhea, nausea and vomiting.  Genitourinary: Negative for frequency and urgency.  Musculoskeletal: Negative for back pain and joint pain.  Neurological: Negative for sensory change, speech change, focal weakness and weakness.  Psychiatric/Behavioral: Negative for depression and hallucinations. The patient is not nervous/anxious.    Tolerating Diet:cld Tolerating PT: yes  DRUG ALLERGIES:   Allergies  Allergen Reactions  . Tape Swelling  . Vicodin [Hydrocodone-Acetaminophen] Itching and Rash    VITALS:  Blood pressure (!) 101/58, pulse 75, temperature 98.2 F (36.8 C), temperature source Oral, resp. rate (!) 21, height 5' 2"  (1.575 m), weight 117.9 kg (260 lb), SpO2 94 %.  PHYSICAL EXAMINATION:   Physical Exam  GENERAL:  51 y.o.-year-old patient lying in the bed with no acute distress. Obese-morbid EYES: Pupils equal, round, reactive to light and accommodation. No scleral icterus. Extraocular muscles intact.  HEENT: Head atraumatic, normocephalic. Oropharynx and nasopharynx clear.  NECK:  Supple, no jugular venous distention. No thyroid enlargement, no tenderness.  LUNGS: Normal breath sounds bilaterally, no wheezing, rales, rhonchi. No use of accessory muscles of respiration.  CARDIOVASCULAR: S1, S2 normal. No  murmurs, rubs, or gallops.  ABDOMEN: Soft, nontender, nondistended. Bowel sounds present. No organomegaly or mass.  EXTREMITIES: No cyanosis, clubbing or edema b/l.    NEUROLOGIC: Cranial nerves II through XII are intact. No focal Motor or sensory deficits b/l.   PSYCHIATRIC:  patient is alert and oriented x 3.  SKIN: No obvious rash, lesion, or ulcer.   LABORATORY PANEL:  CBC  Recent Labs Lab 06/29/16 0925  WBC 3.4*  HGB 13.8  HCT 40.0  PLT 73*    Chemistries   Recent Labs Lab 06/28/16 1610 06/29/16 0925  NA 135 133*  K 3.7 3.9  CL 100* 101  CO2 22 28  GLUCOSE 351* 212*  BUN 13 9  CREATININE 0.57 0.60  CALCIUM 9.3 8.4*  AST 94*  --   ALT 105*  --   ALKPHOS 102  --   BILITOT 0.9  --    Cardiac Enzymes No results for input(s): TROPONINI in the last 168 hours. RADIOLOGY:  Ct Abdomen Pelvis W Contrast  Result Date: 06/28/2016 CLINICAL DATA:  Right-sided abdominal pain and swelling for 2-3 days with nausea and vomiting. Prior cholecystectomy. EXAM: CT ABDOMEN AND PELVIS WITH CONTRAST TECHNIQUE: Multidetector CT imaging of the abdomen and pelvis was performed using the standard protocol following bolus administration of intravenous contrast. CONTRAST:  119m ISOVUE-300 IOPAMIDOL (ISOVUE-300) INJECTION 61% COMPARISON:  06/24/2015.  10/04/2014. FINDINGS: Lower chest:  Unremarkable. Hepatobiliary: Liver measures 25.4 cm craniocaudal length. Nodular liver contour compatible with cirrhosis. 2.1 cm hypoattenuating lesion along the falciform ligament is stable since 2016. 6 mm hypoattenuating lesion lateral segment left liver was also present on the study from nearly 2 years ago and is stable. Tiny hypodensities towards the hepatic dome and in the  medial segment left liver are also unchanged. No enhancing liver lesion. Gallbladder surgically absent. Pneumobilia is compatible with prior sphincterotomy in 2016. No intra or extrahepatic biliary duct dilatation. Pancreas: No focal mass  lesion. No dilatation of the main duct. No intraparenchymal cyst. No peripancreatic edema. Spleen: Enlarged at 20 cm craniocaudal length. No focal mass lesion. Adrenals/Urinary Tract: Right adrenal gland is normal. Tiny left adrenal nodule is stable. Kidneys are unremarkable. No evidence for hydroureter. The urinary bladder appears normal for the degree of distention. Stomach/Bowel: Stomach is nondistended. No gastric wall thickening. No evidence of outlet obstruction. Duodenum is normally positioned as is the ligament of Treitz. No small bowel wall thickening. No small bowel dilatation. The terminal ileum is normal. The appendix is normal. No gross colonic mass. No colonic wall thickening. No substantial diverticular change. Vascular/Lymphatic: There is abdominal aortic atherosclerosis without aneurysm. Portal vein and superior mesenteric vein are patent. Splenic vein is patent but prominent. No gastrohepatic or hepatoduodenal ligament lymphadenopathy. No retroperitoneal lymphadenopathy. No pelvic lymphadenopathy Reproductive: Uterus surgically absent.  There is no adnexal mass. Other: No intraperitoneal free fluid. Musculoskeletal: Bone windows reveal no worrisome lytic or sclerotic osseous lesions. Tiny paraumbilical ventral hernia contains only fat. IMPRESSION: 1. No acute findings in the abdomen or pelvis. 2. Hepatomegaly. Nodular liver contour compatible with cirrhosis. Multiple hypoattenuating lesions in the liver parenchyma are stable back to 2016 and are compatible with benign etiology, likely cysts. 3. Prominent portal, superior mesenteric, and splenic veins, are associated with splenomegaly raising concern for portal venous hypertension. Electronically Signed   By: Misty Stanley M.D.   On: 06/28/2016 19:32   ASSESSMENT AND PLAN:  51 year old female with past medical history of chronic liver disease, obesity, obstructive sleep apnea, chronic kidney disease stage III, hypertension, hypothyroidism, GERD,  diabetes, COPD, history of previous CVA, who presents to the hospital due to abdominal pain and noted to have melanotic stools.  1. GI bleed-suspected to be upper GI bleed given the patient's melanotic stools. -Hemoglobin currently is stable.  -Patient has history of portal hypertension. Her H&H is stable. Dr. Vicente Males with perform EGD tomorrow. -Placed on IV Protonix.  2. Abdominal pain-etiology unclear. CT scan abdomen pelvis showing hepatomegaly with some cirrhosis but no evidence of acute intra-abdominal pathology. Continue supportive care for now.  3. Diabetes type II without complication-hold scheduled insulin. Place on sliding scale insulin for now.  4. Hx of Chronic Liver Disease - due to nonalcoholic steatohepatitis. -No evidence of hepatic encephalopathy. Continue lactulose, Xifaxan.  5. GERD-continue Protonix.  6. Depression-continue Celexa.  7. Diabetic neuropathy-continue gabapentin  Case discussed with Care Management/Social Worker. Management plans discussed with the patient, family and they are in agreement.  CODE STATUS: full  DVT Prophylaxis: SCD  TOTAL TIME TAKING CARE OF THIS PATIENT: 30 minutes.  >50% time spent on counselling and coordination of care  POSSIBLE D/C IN 1-2 DAYS, DEPENDING ON CLINICAL CONDITION.  Note: This dictation was prepared with Dragon dictation along with smaller phrase technology. Any transcriptional errors that result from this process are unintentional.  Deandria Klute M.D on 06/29/2016 at 5:07 PM  Between 7am to 6pm - Pager - [phone]  After 6pm go to [email or web address] - password [email or web address] [facility]  Office  [phone]  CC: Primary care physician; Ash[place], Neldon Labella, MD

## 2016-06-30 ENCOUNTER — Observation Stay: Payer: Medicaid Other | Admitting: Registered Nurse

## 2016-06-30 ENCOUNTER — Encounter
Admission: EM | Disposition: A | Discharge status: Home / Self Care | Payer: Self-pay | Source: Home / Self Care | Attending: Emergency Medicine

## 2016-06-30 ENCOUNTER — Encounter: Payer: Self-pay | Admitting: *Deleted

## 2016-06-30 DIAGNOSIS — K766 Portal hypertension: Secondary | ICD-10-CM | POA: Diagnosis not present

## 2016-06-30 DIAGNOSIS — K922 Gastrointestinal hemorrhage, unspecified: Secondary | ICD-10-CM | POA: Diagnosis not present

## 2016-06-30 DIAGNOSIS — K2970 Gastritis, unspecified, without bleeding: Secondary | ICD-10-CM

## 2016-06-30 DIAGNOSIS — K3189 Other diseases of stomach and duodenum: Secondary | ICD-10-CM

## 2016-06-30 DIAGNOSIS — I8500 Esophageal varices without bleeding: Secondary | ICD-10-CM

## 2016-06-30 HISTORY — PX: ESOPHAGOGASTRODUODENOSCOPY: SHX5428

## 2016-06-30 LAB — GLUCOSE, CAPILLARY
Glucose-Capillary: 344 mg/dL — ABNORMAL HIGH (ref 65–99)
Glucose-Capillary: 296 mg/dL — ABNORMAL HIGH (ref 65–99)
Glucose-Capillary: 163 mg/dL — ABNORMAL HIGH (ref 65–99)

## 2016-06-30 LAB — HIV ANTIBODY (ROUTINE TESTING W REFLEX): HIV Screen 4th Generation wRfx: NONREACTIVE

## 2016-06-30 SURGERY — EGD (ESOPHAGOGASTRODUODENOSCOPY)
Anesthesia: General

## 2016-06-30 MED ORDER — MIDAZOLAM HCL 2 MG/2ML IJ SOLN
INTRAMUSCULAR | Status: DC | PRN
  Administered 2016-06-30: 2 mg via INTRAVENOUS

## 2016-06-30 MED ORDER — GLYCOPYRROLATE 0.2 MG/ML IJ SOLN
INTRAMUSCULAR | Status: AC
  Filled 2016-06-30: qty 1

## 2016-06-30 MED ORDER — GLYCOPYRROLATE 0.2 MG/ML IJ SOLN
INTRAMUSCULAR | Status: DC | PRN
  Administered 2016-06-30: 0.2 mg via INTRAVENOUS

## 2016-06-30 MED ORDER — LIDOCAINE HCL (PF) 2 % IJ SOLN
INTRAMUSCULAR | Status: AC
  Filled 2016-06-30: qty 2

## 2016-06-30 MED ORDER — INSULIN ASPART 100 UNIT/ML ~~LOC~~ SOLN
0.0000 [IU] | Freq: Three times a day (TID) | SUBCUTANEOUS | Status: DC
  Administered 2016-06-30: 3 [IU] via SUBCUTANEOUS

## 2016-06-30 MED ORDER — PROPOFOL 500 MG/50ML IV EMUL
INTRAVENOUS | Status: DC | PRN
  Administered 2016-06-30: 150 ug/kg/min via INTRAVENOUS

## 2016-06-30 MED ORDER — PROPOFOL 10 MG/ML IV BOLUS
INTRAVENOUS | Status: AC
  Filled 2016-06-30: qty 20

## 2016-06-30 MED ORDER — PANTOPRAZOLE SODIUM 40 MG PO TBEC: 40.0000 mg | DELAYED_RELEASE_TABLET | Freq: Two times a day (BID) | ORAL | 1 refills | Status: DC

## 2016-06-30 MED ORDER — MIDAZOLAM HCL 2 MG/2ML IJ SOLN
INTRAMUSCULAR | Status: AC
  Filled 2016-06-30: qty 2

## 2016-06-30 MED ORDER — LIDOCAINE HCL (CARDIAC) 20 MG/ML IV SOLN
INTRAVENOUS | Status: DC | PRN
  Administered 2016-06-30: 40 mg via INTRAVENOUS

## 2016-06-30 MED ORDER — PROPOFOL 10 MG/ML IV BOLUS
INTRAVENOUS | Status: DC | PRN
  Administered 2016-06-30: 60 mg via INTRAVENOUS

## 2016-06-30 NOTE — Anesthesia Procedure Notes (Signed)
Date/Time: 06/30/2016 11:12 AM Performed by: Doreen Salvage Pre-anesthesia Checklist: Patient identified, Emergency Drugs available, Suction available and Patient being monitored Patient Re-evaluated:Patient Re-evaluated prior to inductionOxygen Delivery Method: Nasal cannula Intubation Type: IV induction Dental Injury: Teeth and Oropharynx as per pre-operative assessment  Comments: Nasal cannula with etCO2 monitoring

## 2016-06-30 NOTE — H&P (Signed)
Jonathon Bellows MD [address], [place], [place] [address] Phone: [phone] Fax : [phone]  Primary Care Physician:  Ardine Eng, MD Primary Gastroenterologist:  Dr. Jonathon Bellows   Pre-Procedure History & Physical: HPI:  Lori Mckenzie is a 51 y.o. female is here for an endoscopy.   Past Medical History:  Diagnosis Date  . Abdominal abscess 08/25/2014  . Acid reflux 08/10/2010  . Acute cervical myofascial strain 02/09/2016  . Anxiety   . Ascites   . Asthma   . Back pain   . Bile leak, postoperative 07/17/2014  . Brittle bone disease   . Cancer ([place])    Uteriine  ca 86yr ago partial hysterectomy  . Cervical disc disease   . Chronic kidney disease   . Collagen vascular disease ([place]    RA  3-4 yrs ago  . COPD (chronic obstructive pulmonary disease) ([place]   . Diabetes mellitus without complication ([place]   . GERD (gastroesophageal reflux disease)   . Hypertension   . Hypothyroidism   . Left upper quadrant pain 01/09/2014  . Major depressive disorder with single episode 12/05/2011  . Major depressive disorder, single episode 12/05/2011  . Migraines   . NASH (nonalcoholic steatohepatitis)   . Respiratory infection    2/17  . Shock ([place] 09/18/2014  . Sleep apnea   . Sleep apnea   . Syncope 11/16/2014  . Thyroid disease   . TIA (transient ischemic attack)     Past Surgical History:  Procedure Laterality Date  . ABDOMINAL HYSTERECTOMY    . CHOLECYSTECTOMY N/A 07/15/2014   Procedure: LAPAROSCOPIC CHOLECYSTECTOMY with liver biopsy ;  Surgeon: MSherri Rad MD;  Location: [facility] ORS;  Service: General;  Laterality: N/A;  . COLONOSCOPY WITH PROPOFOL N/A 06/23/2014   Procedure: COLONOSCOPY WITH PROPOFOL;  Surgeon: MLollie Sails MD;  [facility];  Service: Endoscopy;  Laterality: N/A;  . ERCP N/A 07/16/2014   Procedure: ENDOSCOPIC RETROGRADE CHOLANGIOPANCREATOGRAPHY (ERCP);  Surgeon: MClarene Essex MD;  [facility];  Service: Endoscopy;  Laterality: N/A;    . ERCP N/A 10/03/2014   Procedure: ENDOSCOPIC RETROGRADE CHOLANGIOPANCREATOGRAPHY (ERCP);  Surgeon: PHulen Luster MD;  [facility];  Service: Gastroenterology;  Laterality: N/A;  . ESOPHAGOGASTRODUODENOSCOPY N/A 06/23/2014   Procedure: ESOPHAGOGASTRODUODENOSCOPY (EGD);  Surgeon: MLollie Sails MD;  [facility];  Service: Endoscopy;  Laterality: N/A;  . ESOPHAGOGASTRODUODENOSCOPY (EGD) WITH PROPOFOL N/A 12/01/2015   Procedure: ESOPHAGOGASTRODUODENOSCOPY (EGD) WITH PROPOFOL;  Surgeon: MLollie Sails MD;  [facility];  Service: Endoscopy;  Laterality: N/A;  . TUBAL LIGATION    . WISDOM TOOTH EXTRACTION      Prior to Admission medications   Medication Sig Start Date End Date Taking? Authorizing Provider  ALPRAZolam (Duanne Moron 1 MG tablet Take 1 mg by mouth 3 (three) times daily.    Yes [provider]  Cholecalciferol (VITAMIN D-1000 MAX ST) 1000 units tablet Take 2,000 Units by mouth daily.    Yes [provider]  citalopram (CELEXA) 20 MG tablet Take 20 mg by mouth daily.  07/24/15  Yes [provider]  diclofenac sodium (VOLTAREN) 1 % GEL Apply 4 g topically 4 (four) times daily. 04/12/16 10/09/16 Yes NMilinda Pointer MD  dicyclomine (BENTYL) 10 MG capsule Take 10 mg by mouth 4 (four) times daily -  before meals and at bedtime.    Yes [provider]  fluticasone (FLONASE) 50 MCG/ACT nasal spray Place 1-2 sprays into both nostrils daily.    Yes  [provider]  Folate-B12-Intrinsic Factor (INTRINSI B12-FOLATE) 117-356-70 MCG-MCG-MG TABS Take 1 tablet by mouth daily. Reported on 04/06/2015   Yes [provider]  furosemide (LASIX) 40 MG tablet Take 40 mg by mouth daily.    Yes [provider]  gabapentin (NEURONTIN) 300 MG capsule Take 600 mg by mouth 4 (four) times daily as needed (for pain).    Yes [provider]  insulin aspart (NOVOLOG) 100 UNIT/ML injection 30 units with meals 3  times daily plus sliding scale. 06/03/16  Yes [provider]  Insulin Detemir (LEVEMIR FLEXPEN [place]) Inject 50 Units into the skin 2 (two) times daily.    Yes [provider]  ipratropium-albuterol (DUONEB) 0.5-2.5 (3) MG/3ML SOLN Take 3 mLs by nebulization every 4 (four) hours as needed.   Yes [provider]  lactulose (CHRONULAC) 10 GM/15ML solution Take 30 g by mouth daily.  03/13/15  Yes [provider]  naloxone Karma Greaser) 2 MG/2ML injection Inject into the vein as needed.   Yes [provider]  nystatin (MYCOSTATIN) powder Apply 1 g topically 3 (three) times daily as needed (for irritation).    Yes [provider]  oxyCODONE (OXY IR/ROXICODONE) 5 MG immediate release tablet Take 5 mg by mouth every 6 (six) hours as needed for severe pain.   Yes [provider]  pantoprazole (PROTONIX) 40 MG tablet Take 40 mg by mouth daily.  08/31/15  Yes [provider]  potassium chloride SA (K-DUR,KLOR-CON) 20 MEQ tablet Take 10-20 mEq by mouth daily.    Yes [provider]  promethazine (PHENERGAN) 12.5 MG tablet Take 12.5 mg by mouth every 6 (six) hours as needed for nausea or vomiting.    Yes [provider]  rifaximin (XIFAXAN) 550 MG TABS tablet Take 550 mg by mouth 2 (two) times daily.    Yes [provider]  rizatriptan (MAXALT) 10 MG tablet Take 10 mg by mouth as needed for migraine.    Yes [provider]  spironolactone (ALDACTONE) 100 MG tablet Take 100 mg by mouth daily.    Yes [provider]  vitamin B-12 (CYANOCOBALAMIN) 1000 MCG tablet Take 2,000 mcg by mouth daily.   Yes [provider]    Allergies as of 06/28/2016 - Review Complete 06/28/2016  Allergen Reaction Noted  . Tape Swelling 10/03/2014  . Vicodin [hydrocodone-acetaminophen] Itching and Rash 01/07/2011    Family History  Problem Relation Age of Onset  . Lung cancer Mother   . Ulcers Father   . Emphysema  Father   . Heart disease Sister   . Ulcers Sister   . Heart disease Brother     Social History   Social History  . Marital status: Married    Spouse name: N/A  . Number of children: N/A  . Years of education: N/A   Occupational History  . Not on file.   Social History Main Topics  . Smoking status: Current Every Day Smoker    Types: Cigarettes  . Smokeless tobacco: Never Used  . Alcohol use No     Comment: occ  . Drug use: No  . Sexual activity: Not on file   Other Topics Concern  . Not on file   Social History Narrative  . No narrative on file    Review of Systems: See HPI, otherwise negative ROS  Physical Exam: BP 105/60   Pulse (!) 59   Temp 97.4 F (36.3 C) (Tympanic)   Resp 16  Ht 5' 2"  (1.575 m)   Wt 260 lb (117.9 kg)   SpO2 95%   BMI 47.55 kg/m  General:   Alert,  pleasant and cooperative in NAD Head:  Normocephalic and atraumatic. Neck:  Supple; no masses or thyromegaly. Lungs:  Clear throughout to auscultation.    Heart:  Regular rate and rhythm. Abdomen:  Soft, nontender and nondistended. Normal bowel sounds, without guarding, and without rebound.   Neurologic:  Alert and  oriented x4;  grossly normal neurologically.  Impression/Plan: Lori Mckenzie is here for an endoscopy to be performed for acute GI bleed  Risks, benefits, limitations, and alternatives regarding  endoscopy have been reviewed with the patient.  Questions have been answered.  All parties agreeable.   Jonathon Bellows, MD  06/30/2016, 11:06 AM

## 2016-06-30 NOTE — Progress Notes (Signed)
Inpatient Diabetes Program Recommendations  AACE/ADA: New Consensus Statement on Inpatient Glycemic Control (2015)  Target Ranges:  Prepandial:   less than 140 mg/dL      Peak postprandial:   less than 180 mg/dL (1-2 hours)      Critically ill patients:  140 - 180 mg/dL   Lab Results  Component Value Date   GLUCAP 296 (H) 06/30/2016   HGBA1C 5.7 07/12/2014    Review of Glycemic Control  Results for [name], [name] (MRN [id number]) as of 06/30/2016 08:55  Ref. Range 06/29/2016 09:25 06/29/2016 11:23 06/29/2016 16:14 06/29/2016 21:33 06/30/2016 08:06  Glucose-Capillary Latest Ref Range: 65 - 99 mg/dL  188 (H) 234 (H) 277 (H) 296 (H)    Diabetes history: DM2 Outpatient Diabetes medications: Levemir 50 units BID, Novolog 30 units TID with meals plus correction scale Current orders for Inpatient glycemic control: Novolog 0-9 units TID with meals, Novolog 0-5 units QHS, Lantus 30 units bid  Inpatient Diabetes Program Recommendations:  Please consider changing Lantus to Levemir 30 units bid   Please consider increasing Novolog correction to Moderate scale (0-15 units) and change frequency of CBGs and Novolog to Q4H if patient will remain NPO.   Please consider ordering an A1C to evaluate glycemic control over the past 2-3 months.  Gentry Fitz, RN, BA, MHA, CDE Diabetes Coordinator Inpatient Diabetes Program  [phone] (Team Pager) [phone] ([place]) 06/30/2016 8:58 AM

## 2016-06-30 NOTE — OR Nursing (Signed)
Report to jack ,rn on the floor. Pt s/p endoscopy.. Dx gastritis and esophageal varices. Return to floor

## 2016-06-30 NOTE — Transfer of Care (Signed)
Immediate Anesthesia Transfer of Care Note  Patient: Lori Mckenzie  Procedure(s) Performed: Procedure(s): ESOPHAGOGASTRODUODENOSCOPY (EGD) (N/A)  Patient Location: PACU and Endoscopy Unit  Anesthesia Type:General  Level of Consciousness: sedated  Airway & Oxygen Therapy: Patient Spontanous Breathing and Patient connected to nasal cannula oxygen  Post-op Assessment: Report given to RN and Post -op Vital signs reviewed and stable  Post vital signs: Reviewed and stable  Last Vitals:  Vitals:   06/30/16 1033 06/30/16 1132  BP: 105/60 (!) 151/98  Pulse: (!) 59 92  Resp: 16 (!) 21  Temp: 36.3 C 37.1 C    Complications: No apparent anesthesia complications

## 2016-06-30 NOTE — Discharge Summary (Signed)
[place] [facility] [place] NAME: Lori Mckenzie    MR#:  [id number]  DATE OF BIRTH:  12-11-1965  DATE OF ADMISSION:  06/28/2016 ADMITTING PHYSICIAN: Lori Leber, MD  DATE OF DISCHARGE: 06/30/2016  PRIMARY CARE PHYSICIAN: Ashkin, Neldon Labella, MD    ADMISSION DIAGNOSIS:  Pain of upper abdomen [R10.10] Gastrointestinal hemorrhage, unspecified gastrointestinal hemorrhage type [K92.2]  DISCHARGE DIAGNOSIS:   Melena due to Acute on chronic gastritis on EGD with portal HTN  SECONDARY DIAGNOSIS:   Past Medical History:  Diagnosis Date  . Abdominal abscess 08/25/2014  . Acid reflux 08/10/2010  . Acute cervical myofascial strain 02/09/2016  . Anxiety   . Ascites   . Asthma   . Back pain   . Bile leak, postoperative 07/17/2014  . Brittle bone disease   . Cancer ([place])    Uteriine  ca 27yr ago partial hysterectomy  . Cervical disc disease   . Chronic kidney disease   . Collagen vascular disease ([place]    RA  3-4 yrs ago  . COPD (chronic obstructive pulmonary disease) ([place]   . Diabetes mellitus without complication ([place]   . GERD (gastroesophageal reflux disease)   . Hypertension   . Hypothyroidism   . Left upper quadrant pain 01/09/2014  . Major depressive disorder with single episode 12/05/2011  . Major depressive disorder, single episode 12/05/2011  . Migraines   . NASH (nonalcoholic steatohepatitis)   . Respiratory infection    2/17  . Shock ([place] 09/18/2014  . Sleep apnea   . Sleep apnea   . Syncope 11/16/2014  . Thyroid disease   . TIA (transient ischemic attack)     HOSPITAL COURSE:   51year old female with past medical history of chronic liver disease, obesity, obstructive sleep apnea, chronic kidney disease stage III, hypertension, hypothyroidism, GERD, diabetes, COPD, history of previous CVA, who presents to the hospital due to abdominal pain and noted to have melanotic stools.  1. GI bleed-suspected to be upper GI bleed given  the patient's melanotic stools. -Hemoglobin currently is stable.  -Patient has history of portal hypertension. Her H&H is stable. - Dr. AVicente Maleswith perform EGD showed Gastritis.- Portal hypertensive gastropathy- Grade I esophageal varices -on IV Protonix.---to oral ppi -hgb stable at 13.6  2. Abdominal pain-etiology unclear. CT scan abdomen pelvis showing hepatomegaly with some cirrhosis but no evidence of acute intra-abdominal pathology. Continue supportive care for now.  3. Diabetestype II without complication-hold scheduled insulin. Place on sliding scale insulin for now.  4. Hx of Chronic Liver Disease - due to nonalcoholic steatohepatitis. -No evidence of hepatic encephalopathy. Continue lactulose, Xifaxan.  5. GERD-continue Protonix.  6. Depression-continue Celexa.  7. Diabetic neuropathy-continue gabapentin  D/c home later today. Pt is on chronic narcotics at home. I will not be giving any prescription for narcotics to patient. She can follow up with her primary care physician. CONSULTS OBTAINED:  Treatment Team:  [name] MD  DRUG ALLERGIES:   Allergies  Allergen Reactions  . Tape Swelling  . Vicodin [Hydrocodone-Acetaminophen] Itching and Rash    DISCHARGE MEDICATIONS:   Current Discharge Medication List    CONTINUE these medications which have CHANGED   Details  pantoprazole (PROTONIX) 40 MG tablet Take 1 tablet (40 mg total) by mouth 2 (two) times daily. Qty: 60 tablet, Refills: 1      CONTINUE these medications which have NOT CHANGED   Details  ALPRAZolam (XANAX) 1 MG tablet Take 1 mg by mouth  3 (three) times daily.     Cholecalciferol (VITAMIN D-1000 MAX ST) 1000 units tablet Take 2,000 Units by mouth daily.     citalopram (CELEXA) 20 MG tablet Take 20 mg by mouth daily.     diclofenac sodium (VOLTAREN) 1 % GEL Apply 4 g topically 4 (four) times daily. Qty: 100 g, Refills: 2   Associated Diagnoses: Primary osteoarthritis involving multiple  joints    dicyclomine (BENTYL) 10 MG capsule Take 10 mg by mouth 4 (four) times daily -  before meals and at bedtime.     fluticasone (FLONASE) 50 MCG/ACT nasal spray Place 1-2 sprays into both nostrils daily.     Folate-B12-Intrinsic Factor (INTRINSI B12-FOLATE) 878-676-72 MCG-MCG-MG TABS Take 1 tablet by mouth daily. Reported on 04/06/2015    furosemide (LASIX) 40 MG tablet Take 40 mg by mouth daily.     gabapentin (NEURONTIN) 300 MG capsule Take 600 mg by mouth 4 (four) times daily as needed (for pain).     insulin aspart (NOVOLOG) 100 UNIT/ML injection 30 units with meals 3 times daily plus sliding scale.    Insulin Detemir (LEVEMIR FLEXPEN [place]) Inject 50 Units into the skin 2 (two) times daily.     ipratropium-albuterol (DUONEB) 0.5-2.5 (3) MG/3ML SOLN Take 3 mLs by nebulization every 4 (four) hours as needed.    lactulose (CHRONULAC) 10 GM/15ML solution Take 30 g by mouth daily.     naloxone [facility]) 2 MG/2ML injection Inject into the vein as needed.    nystatin (MYCOSTATIN) powder Apply 1 g topically 3 (three) times daily as needed (for irritation).     oxyCODONE (OXY IR/ROXICODONE) 5 MG immediate release tablet Take 5 mg by mouth every 6 (six) hours as needed for severe pain.    potassium chloride SA (K-DUR,KLOR-CON) 20 MEQ tablet Take 10-20 mEq by mouth daily.     promethazine (PHENERGAN) 12.5 MG tablet Take 12.5 mg by mouth every 6 (six) hours as needed for nausea or vomiting.     rifaximin (XIFAXAN) 550 MG TABS tablet Take 550 mg by mouth 2 (two) times daily.     rizatriptan (MAXALT) 10 MG tablet Take 10 mg by mouth as needed for migraine.     spironolactone (ALDACTONE) 100 MG tablet Take 100 mg by mouth daily.     vitamin B-12 (CYANOCOBALAMIN) 1000 MCG tablet Take 2,000 mcg by mouth daily.        If you experience worsening of your admission symptoms, develop shortness of breath, life threatening emergency, suicidal or homicidal thoughts you must seek medical  attention immediately by calling 911 or calling your MD immediately  if symptoms less severe.  You Must read complete instructions/literature along with all the possible adverse reactions/side effects for all the Medicines you take and that have been prescribed to you. Take any new Medicines after you have completely understood and accept all the possible adverse reactions/side effects.   Please note  You were cared for by a hospitalist during your hospital stay. If you have any questions about your discharge medications or the care you received while you were in the hospital after you are discharged, you can call the unit and asked to speak with the hospitalist on call if the hospitalist that took care of you is not available. Once you are discharged, your primary care physician will handle any further medical issues. Please note that NO REFILLS for any discharge medications will be authorized once you are discharged, as it is imperative that you return to  your primary care physician (or establish a relationship with a primary care physician if you do not have one) for your aftercare needs so that they can reassess your need for medications and monitor your lab values. Today   SUBJECTIVE   No new complaints. Waiting for GI eval.  VITAL SIGNS:  Blood pressure 105/68, pulse 73, temperature 98.4 F (36.9 C), temperature source Oral, resp. rate 16, height 5' 2"  (1.575 m), weight 117.9 kg (260 lb), SpO2 93 %.  I/O:   Intake/Output Summary (Last 24 hours) at 06/30/16 1340 Last data filed at 06/30/16 1130  Gross per 24 hour  Intake          1765.83 ml  Output              800 ml  Net           965.83 ml    PHYSICAL EXAMINATION:  GENERAL:  51 y.o.-year-old patient lying in the bed with no acute distress. Morbidly obese. EYES: Pupils equal, round, reactive to light and accommodation. No scleral icterus. Extraocular muscles intact.  HEENT: Head atraumatic, normocephalic. Oropharynx and  nasopharynx clear.  NECK:  Supple, no jugular venous distention. No thyroid enlargement, no tenderness.  LUNGS: Normal breath sounds bilaterally, no wheezing, rales,rhonchi or crepitation. No use of accessory muscles of respiration.  CARDIOVASCULAR: S1, S2 normal. No murmurs, rubs, or gallops.  ABDOMEN: Soft, non-tender, non-distended. Bowel sounds present. No organomegaly or mass.  EXTREMITIES: No pedal edema, cyanosis, or clubbing.  NEUROLOGIC: Cranial nerves II through XII are intact. Muscle strength 5/5 in all extremities. Sensation intact. Gait not checked.  PSYCHIATRIC: The patient is alert and oriented x 3.  SKIN: No obvious rash, lesion, or ulcer.   DATA REVIEW:   CBC   Recent Labs Lab 06/29/16 0925  WBC 3.4*  HGB 13.8  HCT 40.0  PLT 73*    Chemistries   Recent Labs Lab 06/28/16 1610 06/29/16 0925  NA 135 133*  K 3.7 3.9  CL 100* 101  CO2 22 28  GLUCOSE 351* 212*  BUN 13 9  CREATININE 0.57 0.60  CALCIUM 9.3 8.4*  AST 94*  --   ALT 105*  --   ALKPHOS 102  --   BILITOT 0.9  --     Microbiology Results   No results found for this or any previous visit (from the past 240 hour(s)).  RADIOLOGY:  Ct Abdomen Pelvis W Contrast  Result Date: 06/28/2016 CLINICAL DATA:  Right-sided abdominal pain and swelling for 2-3 days with nausea and vomiting. Prior cholecystectomy. EXAM: CT ABDOMEN AND PELVIS WITH CONTRAST TECHNIQUE: Multidetector CT imaging of the abdomen and pelvis was performed using the standard protocol following bolus administration of intravenous contrast. CONTRAST:  121m ISOVUE-300 IOPAMIDOL (ISOVUE-300) INJECTION 61% COMPARISON:  06/24/2015.  10/04/2014. FINDINGS: Lower chest:  Unremarkable. Hepatobiliary: Liver measures 25.4 cm craniocaudal length. Nodular liver contour compatible with cirrhosis. 2.1 cm hypoattenuating lesion along the falciform ligament is stable since 2016. 6 mm hypoattenuating lesion lateral segment left liver was also present on the  study from nearly 2 years ago and is stable. Tiny hypodensities towards the hepatic dome and in the medial segment left liver are also unchanged. No enhancing liver lesion. Gallbladder surgically absent. Pneumobilia is compatible with prior sphincterotomy in 2016. No intra or extrahepatic biliary duct dilatation. Pancreas: No focal mass lesion. No dilatation of the main duct. No intraparenchymal cyst. No peripancreatic edema. Spleen: Enlarged at 20 cm craniocaudal length. No focal mass lesion.  Adrenals/Urinary Tract: Right adrenal gland is normal. Tiny left adrenal nodule is stable. Kidneys are unremarkable. No evidence for hydroureter. The urinary bladder appears normal for the degree of distention. Stomach/Bowel: Stomach is nondistended. No gastric wall thickening. No evidence of outlet obstruction. Duodenum is normally positioned as is the ligament of Treitz. No small bowel wall thickening. No small bowel dilatation. The terminal ileum is normal. The appendix is normal. No gross colonic mass. No colonic wall thickening. No substantial diverticular change. Vascular/Lymphatic: There is abdominal aortic atherosclerosis without aneurysm. Portal vein and superior mesenteric vein are patent. Splenic vein is patent but prominent. No gastrohepatic or hepatoduodenal ligament lymphadenopathy. No retroperitoneal lymphadenopathy. No pelvic lymphadenopathy Reproductive: Uterus surgically absent.  There is no adnexal mass. Other: No intraperitoneal free fluid. Musculoskeletal: Bone windows reveal no worrisome lytic or sclerotic osseous lesions. Tiny paraumbilical ventral hernia contains only fat. IMPRESSION: 1. No acute findings in the abdomen or pelvis. 2. Hepatomegaly. Nodular liver contour compatible with cirrhosis. Multiple hypoattenuating lesions in the liver parenchyma are stable back to 2016 and are compatible with benign etiology, likely cysts. 3. Prominent portal, superior mesenteric, and splenic veins, are associated  with splenomegaly raising concern for portal venous hypertension. Electronically Signed   By: Misty Stanley M.D.   On: 06/28/2016 19:32     Management plans discussed with the patient, family and they are in agreement.  CODE STATUS:     Code Status Orders        Start     Ordered   06/28/16 2123  Full code  Continuous     06/28/16 2122    Code Status History    Date Active Date Inactive Code Status Order ID Comments User Context   03/12/2015 12:09 AM 03/13/2015  5:23 PM Full Code [id number]  Gladstone Lighter, MD Inpatient   10/04/2014 12:59 PM 10/06/2014  8:20 PM Full Code [id number]  Baxter Hire, MD Inpatient   09/05/2014  2:29 AM 09/08/2014  6:10 PM Full Code [id number]  Lance Coon, MD Inpatient   08/25/2014  6:23 PM 08/30/2014  1:48 PM Full Code [id number]  Florene Glen, MD ED   08/03/2014  5:12 PM 08/08/2014  3:16 PM Full Code [id number]  Marlyce Huge, MD ED   07/24/2014  6:45 PM 07/30/2014  5:31 PM Full Code [id number]  Florene Glen, MD Inpatient   07/23/2014 11:42 AM 07/24/2014  3:27 AM Full Code [id number]  Sabino Dick, MD [place]   07/12/2014 11:25 PM 07/16/2014  4:30 PM Full Code [id number]  Lance Coon, MD Inpatient      TOTAL TIME TAKING CARE OF THIS PATIENT: *40* minutes.    Cleburne Savini M.D on 06/30/2016 at 1:40 PM  Between 7am to 6pm - Pager - [phone] After 6pm go to [email or web address] - password [email or web address] [facility]  Office  [phone]  CC: Primary care physician; Ashkin, Neldon Labella, MD

## 2016-06-30 NOTE — Progress Notes (Signed)
06/30/2016  16:00  Lori Mckenzie to be D/C'd Home per MD order.  Discussed prescriptions and follow up appointments with the patient. Prescriptions given to patient, medication list explained in detail. Pt verbalized understanding.  Allergies as of 06/30/2016      Reactions   Tape Swelling   Vicodin [hydrocodone-acetaminophen] Itching, Rash      Medication List    TAKE these medications   ALPRAZolam 1 MG tablet Commonly known as:  XANAX Take 1 mg by mouth 3 (three) times daily.   citalopram 20 MG tablet Commonly known as:  CELEXA Take 20 mg by mouth daily.   diclofenac sodium 1 % Gel Commonly known as:  VOLTAREN Apply 4 g topically 4 (four) times daily.   dicyclomine 10 MG capsule Commonly known as:  BENTYL Take 10 mg by mouth 4 (four) times daily -  before meals and at bedtime.   fluticasone 50 MCG/ACT nasal spray Commonly known as:  FLONASE Place 1-2 sprays into both nostrils daily.   furosemide 40 MG tablet Commonly known as:  LASIX Take 40 mg by mouth daily.   gabapentin 300 MG capsule Commonly known as:  NEURONTIN Take 600 mg by mouth 4 (four) times daily as needed (for pain).   insulin aspart 100 UNIT/ML injection Commonly known as:  novoLOG 30 units with meals 3 times daily plus sliding scale.   INTRINSI B12-FOLATE 629-528-41 MCG-MCG-MG Tabs Take 1 tablet by mouth daily. Reported on 04/06/2015   ipratropium-albuterol 0.5-2.5 (3) MG/3ML Soln Commonly known as:  DUONEB Take 3 mLs by nebulization every 4 (four) hours as needed.   lactulose 10 GM/15ML solution Commonly known as:  CHRONULAC Take 30 g by mouth daily.   LEVEMIR FLEXPEN [place] Inject 50 Units into the skin 2 (two) times daily.   naloxone 2 MG/2ML injection Commonly known as:  NARCAN Inject into the vein as needed.   nystatin powder Commonly known as:  MYCOSTATIN/NYSTOP Apply 1 g topically 3 (three) times daily as needed (for irritation).   oxyCODONE 5 MG immediate release tablet Commonly  known as:  Oxy IR/ROXICODONE Take 5 mg by mouth every 6 (six) hours as needed for severe pain.   pantoprazole 40 MG tablet Commonly known as:  PROTONIX Take 1 tablet (40 mg total) by mouth 2 (two) times daily. What changed:  when to take this   potassium chloride SA 20 MEQ tablet Commonly known as:  K-DUR,KLOR-CON Take 10-20 mEq by mouth daily.   promethazine 12.5 MG tablet Commonly known as:  PHENERGAN Take 12.5 mg by mouth every 6 (six) hours as needed for nausea or vomiting.   rizatriptan 10 MG tablet Commonly known as:  MAXALT Take 10 mg by mouth as needed for migraine.   spironolactone 100 MG tablet Commonly known as:  ALDACTONE Take 100 mg by mouth daily.   vitamin B-12 1000 MCG tablet Commonly known as:  CYANOCOBALAMIN Take 2,000 mcg by mouth daily.   VITAMIN D-1000 MAX ST 1000 units tablet Generic drug:  Cholecalciferol Take 2,000 Units by mouth daily.   XIFAXAN 550 MG Tabs tablet Generic drug:  rifaximin Take 550 mg by mouth 2 (two) times daily.       Vitals:   06/30/16 1152 06/30/16 1243  BP: (!) 141/92 105/68  Pulse: 87 73  Resp: 16   Temp:  98.4 F (36.9 C)    Skin clean, dry and intact without evidence of skin break down, no evidence of skin tears noted. IV catheter discontinued intact. Site  without signs and symptoms of complications. Dressing and pressure applied. Pt denies pain at this time. No complaints noted.  An After Visit Summary was printed and given to the patient. Patient escorted via [place], and D/C home via private auto.  Dola Argyle

## 2016-06-30 NOTE — Anesthesia Preprocedure Evaluation (Signed)
Anesthesia Evaluation  Patient identified by MRN, date of birth, ID band Patient awake    Reviewed: Allergy & Precautions, H&P , NPO status , Patient's Chart, lab work & pertinent test results, reviewed documented beta blocker date and time   Airway Mallampati: II   Neck ROM: full    Dental  (+) Poor Dentition, Edentulous Upper, Edentulous Lower   Pulmonary neg pulmonary ROS, asthma , sleep apnea and Continuous Positive Airway Pressure Ventilation , COPD, Current Smoker,    Pulmonary exam normal        Cardiovascular Exercise Tolerance: Poor hypertension, negative cardio ROS Normal cardiovascular exam Rhythm:regular Rate:Normal     Neuro/Psych  Headaches, PSYCHIATRIC DISORDERS TIA Neuromuscular disease negative neurological ROS  negative psych ROS   GI/Hepatic negative GI ROS, Neg liver ROS, GERD  Medicated,(+) Hepatitis -  Endo/Other  negative endocrine ROSdiabetesHypothyroidism Morbid obesity  Renal/GU CRFRenal diseasenegative Renal ROS  negative genitourinary   Musculoskeletal   Abdominal   Peds  Hematology negative hematology ROS (+)   Anesthesia Other Findings Past Medical History: 08/25/2014: Abdominal abscess 08/10/2010: Acid reflux 02/09/2016: Acute cervical myofascial strain No date: Anxiety No date: Ascites No date: Asthma No date: Back pain 07/17/2014: Bile leak, postoperative 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 No date: Cancer ([place])     Comment: Uteriine  ca 13yr ago partial hysterectomy No date: Cervical disc disease No date: Chronic kidney disease No date: Collagen vascular disease (HCC)     Comment: RA  3-4 yrs ago No date: COPD (chronic obstructive pulmonary disease) (* No date: Diabetes mellitus without complication (HCC) No date: GERD (gastroesophageal reflux disease) No date: Hypertension No date: Hypothyroidism 01/09/2014: Left upper quadrant pain 12/05/2011: Major depressive disorder with single  episode 12/05/2011: Major depressive disorder, single episode No date: Migraines No date: NASH (nonalcoholic steatohepatitis) No date: Respiratory infection     Comment: 2/17 09/18/2014: [place] No date: Sleep apnea No date: Sleep apnea 11/16/2014: Syncope No date: Thyroid disease No date: TIA (transient ischemic attack) Past Surgical History: No date: ABDOMINAL HYSTERECTOMY 07/15/2014: CHOLECYSTECTOMY N/A     Comment: Procedure: LAPAROSCOPIC CHOLECYSTECTOMY with               liver biopsy ;  Surgeon: MSherri Rad MD;                Location: [facility] ORS;  Service: General;                Laterality: N/A; 06/23/2014: COLONOSCOPY WITH PROPOFOL N/A     Comment: Procedure: COLONOSCOPY WITH PROPOFOL;                Surgeon: MLollie Sails MD;  [facility];  Service: Endoscopy;  Laterality:               N/A; 07/16/2014: ERCP N/A     Comment: Procedure: ENDOSCOPIC RETROGRADE               CHOLANGIOPANCREATOGRAPHY (ERCP);  Surgeon: MClarene Essex MD;  [facility];  Service:               Endoscopy;  Laterality: N/A; 10/03/2014: ERCP N/A     Comment: Procedure: ENDOSCOPIC RETROGRADE               CHOLANGIOPANCREATOGRAPHY (ERCP);  Surgeon: PEddie Dibbles  Johnell Comings, MD;  Location: [facility] ENDOSCOPY;  Service:               Gastroenterology;  Laterality: N/A; 06/23/2014: ESOPHAGOGASTRODUODENOSCOPY N/A     Comment: Procedure: ESOPHAGOGASTRODUODENOSCOPY (EGD);                Surgeon: Lollie Sails, MD;  Location: [facility]              ENDOSCOPY;  Service: Endoscopy;  Laterality:               N/A; 12/01/2015: ESOPHAGOGASTRODUODENOSCOPY (EGD) WITH PROPOFOL N/A     Comment: Procedure: ESOPHAGOGASTRODUODENOSCOPY (EGD)               WITH PROPOFOL;  Surgeon: Lollie Sails, MD;              Location: [facility] ENDOSCOPY;  Service: Endoscopy;               Laterality: N/A; No date: TUBAL LIGATION No date: WISDOM TOOTH EXTRACTION BMI    Body Mass Index:   47.55 kg/m     Reproductive/Obstetrics negative OB ROS                             Anesthesia Physical Anesthesia Plan  ASA: IV  Anesthesia Plan: General   Post-op Pain Management:    Induction:   PONV Risk Score and Plan:   Airway Management Planned:   Additional Equipment:   Intra-op Plan:   Post-operative Plan:   Informed Consent: I have reviewed the patients History and Physical, chart, labs and discussed the procedure including the risks, benefits and alternatives for the proposed anesthesia with the patient or authorized representative who has indicated his/her understanding and acceptance.   Dental Advisory Given  Plan Discussed with: CRNA  Anesthesia Plan Comments:         Anesthesia Quick Evaluation

## 2016-06-30 NOTE — Progress Notes (Signed)
Insulin levemir flex pens signed out to RN by pharmacy on 6/28 prior to discharge.  Lenis Noon, PharmD 06/30/16 9:36 PM

## 2016-06-30 NOTE — Op Note (Signed)
[facility] Gastroenterology Patient Name: Lori Mckenzie Procedure Date: 06/30/2016 11:12 AM MRN: [id number] Account #: [id number] Date of Birth: 04-30-1965 Admit Type: Inpatient Age: 51 Room: [facility] ENDO ROOM 4 Gender: Female Note Status: Finalized Procedure:            Upper GI endoscopy Indications:          Acute post hemorrhagic anemia Providers:            Jonathon Bellows MD, MD Referring MD:         Neldon Labella. Ashkin MD (Referring MD) Medicines:            Monitored Anesthesia Care Complications:        No immediate complications. Procedure:            Pre-Anesthesia Assessment:                       - Prior to the procedure, a History and Physical was                        performed, and patient medications, allergies and                        sensitivities were reviewed. The patient's tolerance of                        previous anesthesia was reviewed.                       - The risks and benefits of the procedure and the                        sedation options and risks were discussed with the                        patient. All questions were answered and informed                        consent was obtained.                       - ASA Grade Assessment: III - A patient with severe                        systemic disease.                       After obtaining informed consent, the endoscope was                        passed under direct vision. Throughout the procedure,                        the patient's blood pressure, pulse, and oxygen                        saturations were monitored continuously. The Endoscope                        was introduced through the mouth, and advanced to the  third part of duodenum. The upper GI endoscopy was                        accomplished with ease. The patient tolerated the                        procedure well. Findings:      The examined duodenum was normal.      Diffuse moderate inflammation  characterized by congestion (edema) and       erythema was found in the gastric antrum. Biopsies were taken with a       cold forceps for histology.      Moderate portal hypertensive gastropathy was found in the gastric fundus       and in the gastric body.      Grade I varices were found in the distal esophagus. They were 5 mm in       largest diameter. No stigmata of recent bleed Impression:           - Normal examined duodenum.                       - Gastritis. Biopsied.                       - Portal hypertensive gastropathy.                       - Grade I esophageal varices. Recommendation:       - Return patient to hospital ward for ongoing care.                       - Advance diet as tolerated.                       - Continue present medications.                       - Use Prilosec (omeprazole) 40 mg PO daily indefinitely.                       - consider beta blockers for varices and PHG as an                        outpatient based on blood pressure                       F/u with [facility] as an outpatient Procedure Code(s):    --- Professional ---                       [phone], Esophagogastroduodenoscopy, flexible, transoral;                        with biopsy, single or multiple Diagnosis Code(s):    --- Professional ---                       K29.70, Gastritis, unspecified, without bleeding                       K76.6, Portal hypertension  K31.89, Other diseases of stomach and duodenum                       I85.00, Esophageal varices without bleeding                       D62, Acute posthemorrhagic anemia CPT copyright 2016 [employer] Medical [employer]. All rights reserved. The codes documented in this report are preliminary and upon coder review may  be revised to meet current compliance requirements. Jonathon Bellows, MD Jonathon Bellows MD, MD 06/30/2016 11:24:52 AM This report has been signed electronically. Number of Addenda: 0 Note Initiated On:  06/30/2016 11:12 AM      [facility]

## 2016-06-30 NOTE — Anesthesia Post-op Follow-up Note (Cosign Needed)
Anesthesia QCDR form completed.        

## 2016-07-01 ENCOUNTER — Encounter: Payer: Self-pay | Admitting: Gastroenterology

## 2016-07-01 LAB — SURGICAL PATHOLOGY

## 2016-07-03 ENCOUNTER — Encounter: Payer: Self-pay | Admitting: Gastroenterology

## 2016-07-03 NOTE — Anesthesia Postprocedure Evaluation (Signed)
Anesthesia Post Note  Patient: Lori Mckenzie  Procedure(s) Performed: Procedure(s) (LRB): ESOPHAGOGASTRODUODENOSCOPY (EGD) (N/A)  Patient location during evaluation: PACU Anesthesia Type: General Level of consciousness: awake and alert Pain management: pain level controlled Vital Signs Assessment: post-procedure vital signs reviewed and stable Respiratory status: spontaneous breathing, nonlabored ventilation, respiratory function stable and patient connected to nasal cannula oxygen Cardiovascular status: blood pressure returned to baseline and stable Postop Assessment: no signs of nausea or vomiting Anesthetic complications: no     Last Vitals:  Vitals:   06/30/16 1152 06/30/16 1243  BP: (!) 141/92 105/68  Pulse: 87 73  Resp: 16   Temp:  36.9 C    Last Pain:  Vitals:   06/30/16 1402  TempSrc:   PainSc: [name]

## 2016-07-05 ENCOUNTER — Telehealth: Payer: Self-pay | Admitting: *Deleted

## 2016-07-05 ENCOUNTER — Ambulatory Visit: Payer: Medicaid Other | Admitting: Pain Medicine

## 2016-07-05 NOTE — Telephone Encounter (Signed)
As per previous telephone encounter on 06-24-16, patient may have lumbar facet block without steroids. Tendra notified, will schedule and call patient.

## 2016-07-11 ENCOUNTER — Ambulatory Visit: Payer: Medicaid Other | Admitting: Pain Medicine

## 2016-07-11 ENCOUNTER — Ambulatory Visit (HOSPITAL_BASED_OUTPATIENT_CLINIC_OR_DEPARTMENT_OTHER): Payer: Medicaid Other | Admitting: Pain Medicine

## 2016-07-11 ENCOUNTER — Encounter: Payer: Self-pay | Admitting: Pain Medicine

## 2016-07-11 ENCOUNTER — Ambulatory Visit
Admission: RE | Admit: 2016-07-11 | Discharge: 2016-07-11 | Disposition: A | Discharge status: Home / Self Care | Payer: Medicaid Other | Source: Ambulatory Visit | Attending: Pain Medicine | Admitting: Pain Medicine

## 2016-07-11 DIAGNOSIS — G8929 Other chronic pain: Secondary | ICD-10-CM | POA: Diagnosis present

## 2016-07-11 DIAGNOSIS — M545 Low back pain: Secondary | ICD-10-CM | POA: Diagnosis present

## 2016-07-11 DIAGNOSIS — M4696 Unspecified inflammatory spondylopathy, lumbar region: Secondary | ICD-10-CM | POA: Diagnosis not present

## 2016-07-11 DIAGNOSIS — M5442 Lumbago with sciatica, left side: Secondary | ICD-10-CM

## 2016-07-11 DIAGNOSIS — M47896 Other spondylosis, lumbar region: Secondary | ICD-10-CM | POA: Insufficient documentation

## 2016-07-11 DIAGNOSIS — M5126 Other intervertebral disc displacement, lumbar region: Secondary | ICD-10-CM | POA: Diagnosis not present

## 2016-07-11 DIAGNOSIS — M5441 Lumbago with sciatica, right side: Secondary | ICD-10-CM

## 2016-07-11 DIAGNOSIS — M47816 Spondylosis without myelopathy or radiculopathy, lumbar region: Secondary | ICD-10-CM

## 2016-07-11 MED ORDER — ROPIVACAINE HCL 2 MG/ML IJ SOLN
9.0000 mL | Freq: Once | INTRAMUSCULAR | Status: AC
  Administered 2016-07-11: 10 mL via PERINEURAL

## 2016-07-11 MED ORDER — TRIAMCINOLONE ACETONIDE 40 MG/ML IJ SUSP
40.0000 mg | Freq: Once | INTRAMUSCULAR | Status: AC
  Administered 2016-07-11: 40 mg

## 2016-07-11 MED ORDER — ROPIVACAINE HCL 2 MG/ML IJ SOLN
INTRAMUSCULAR | Status: AC
  Filled 2016-07-11: qty 20

## 2016-07-11 MED ORDER — MIDAZOLAM HCL 5 MG/5ML IJ SOLN
INTRAMUSCULAR | Status: AC
  Filled 2016-07-11: qty 5

## 2016-07-11 MED ORDER — MIDAZOLAM HCL 5 MG/5ML IJ SOLN
1.0000 mg | INTRAMUSCULAR | Status: DC | PRN
  Administered 2016-07-11: 4 mg via INTRAVENOUS

## 2016-07-11 MED ORDER — LACTATED RINGERS IV SOLN
1000.0000 mL | Freq: Once | INTRAVENOUS | Status: AC
  Administered 2016-07-11: 1000 mL via INTRAVENOUS

## 2016-07-11 MED ORDER — FENTANYL CITRATE (PF) 100 MCG/2ML IJ SOLN
25.0000 ug | INTRAMUSCULAR | Status: DC | PRN
  Administered 2016-07-11: 100 ug via INTRAVENOUS

## 2016-07-11 MED ORDER — LIDOCAINE HCL (PF) 1.5 % IJ SOLN
20.0000 mL | Freq: Once | INTRAMUSCULAR | Status: DC
  Filled 2016-07-11: qty 20

## 2016-07-11 MED ORDER — TRIAMCINOLONE ACETONIDE 40 MG/ML IJ SUSP
INTRAMUSCULAR | Status: AC
  Filled 2016-07-11: qty 2

## 2016-07-11 MED ORDER — FENTANYL CITRATE (PF) 100 MCG/2ML IJ SOLN
INTRAMUSCULAR | Status: AC
  Filled 2016-07-11: qty 2

## 2016-07-11 NOTE — Patient Instructions (Signed)

## 2016-07-11 NOTE — Progress Notes (Signed)
Patient's Name: Lori Mckenzie  MRN: [id number]  Referring Provider: Vevelyn Francois, NP  DOB: [birth year]  PCP: Ardine Eng, MD  DOS: 07/11/2016  Note by: Kathlen Brunswick. Dossie Arbour, MD  Service setting: Ambulatory outpatient  Specialty: Interventional Pain Management  Patient type: Established  Location: [facility] (AMB) Pain Management Facility  Visit type: Interventional Procedure   Primary Reason for Visit: Interventional Pain Management Treatment. CC: Back Pain (mid to lower)  Procedure:  Anesthesia, Analgesia, Anxiolysis:  Type: Diagnostic Medial Branch Facet Block Region: Lumbar Level: L2, L3, L4, L5, & S1 Medial Branch Level(s) Laterality: Bilateral  Type: Local Anesthesia with Moderate (Conscious) Sedation Local Anesthetic: Lidocaine 1% Route: Intravenous (IV) IV Access: Secured Sedation: Meaningful verbal contact was maintained at all times during the procedure  Indication(s): Analgesia and Anxiety  Indications: 1. Lumbar facet syndrome (Location of Primary Source of Pain) (Bilateral) (R>L)   2. Chronic low back pain (Location of Primary Source of Pain) (Bilateral) (R>L)   3. Lumbar spondylosis    Pain Score: Pre-procedure: 4 /10 Post-procedure: 0-No pain/10  Pre-op Assessment:  Previous date of service: 06/06/16 Service provided: Procedure (bilateral lumbar facets) [name] is a 51 y.o. (year old), female patient, seen today for interventional treatment. She  has a past surgical history that includes Abdominal hysterectomy; Tubal ligation; Colonoscopy with propofol (N/A, 06/23/2014); Esophagogastroduodenoscopy (N/A, 06/23/2014); Cholecystectomy (N/A, 07/15/2014); ERCP (N/A, 07/16/2014); Wisdom tooth extraction; ERCP (N/A, 10/03/2014); Esophagogastroduodenoscopy (egd) with propofol (N/A, 12/01/2015); and Esophagogastroduodenoscopy (N/A, 06/30/2016). [name] has a current medication list which includes the following prescription(s): alprazolam, cholecalciferol, citalopram, diclofenac sodium,  dicyclomine, fluticasone, intrinsi b12-folate, furosemide, gabapentin, insulin aspart, insulin detemir, ipratropium-albuterol, lactulose, lactulose, naloxone, nystatin, oxycodone, potassium chloride sa, promethazine, rifaximin, rizatriptan, spironolactone, and vitamin b-12, and the following Facility-Administered Medications: fentanyl, lidocaine, and midazolam. Her primarily concern today is the Back Pain (mid to lower)  The patient indicates having spoken to her primary care physician and endocrinologist and they have agreed to monitor her hyperglycemia after the injections. The patient has 2 possible reasons for her low back pain. 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 Today we will block the lumbar facets and what ever pain remains is likely to be coming from the L1-2 extrusion.  Initial Vital Signs: Blood pressure 106/87, pulse (!) 110, temperature 97.9 F (36.6 C), resp. rate 16, height 5' 2"  (1.575 m), weight 260 lb (117.9 kg), SpO2 96 %. BMI: 47.55 kg/m  Risk Assessment: Allergies: Reviewed. She is allergic to tape and vicodin [hydrocodone-acetaminophen].  Allergy Precautions: None required Coagulopathies: Reviewed. None identified.  Blood-thinner therapy: None at this time Active Infection(s): Reviewed. None identified. [name] is afebrile  Site Confirmation: [name] was asked to confirm the procedure and laterality before marking the site Procedure checklist: Completed Consent: Before the procedure and under the influence of no sedative(s), amnesic(s), or anxiolytics, the patient was informed of the treatment options, risks and possible complications. To fulfill our ethical and legal obligations, as recommended by the American Medical Association's Code of Ethics, I have informed the patient of my clinical impression; the nature and purpose of the treatment or procedure; the risks, benefits, and  possible complications of the intervention; the alternatives, including doing nothing; the risk(s) and benefit(s) of the alternative treatment(s) or procedure(s); and the risk(s) and benefit(s) of doing nothing. The patient was provided information about the general risks and possible complications associated with the procedure. These  may include, but are not limited to: failure to achieve desired goals, infection, bleeding, organ or nerve damage, allergic reactions, paralysis, and death. In addition, the patient was informed of those risks and complications associated to Spine-related procedures, such as failure to decrease pain; infection (i.e.: Meningitis, epidural or intraspinal abscess); bleeding (i.e.: epidural hematoma, subarachnoid hemorrhage, or any other type of intraspinal or peri-dural bleeding); organ or nerve damage (i.e.: Any type of peripheral nerve, nerve root, or spinal cord injury) with subsequent damage to sensory, motor, and/or autonomic systems, resulting in permanent pain, numbness, and/or weakness of one or several areas of the body; allergic reactions; (i.e.: anaphylactic reaction); and/or death. Furthermore, the patient was informed of those risks and complications associated with the medications. These include, but are not limited to: allergic reactions (i.e.: anaphylactic or anaphylactoid reaction(s)); adrenal axis suppression; blood sugar elevation that in diabetics may result in ketoacidosis or comma; water retention that in patients with history of congestive heart failure may result in shortness of breath, pulmonary edema, and decompensation with resultant heart failure; weight gain; swelling or edema; medication-induced neural toxicity; particulate matter embolism and blood vessel occlusion with resultant organ, and/or nervous system infarction; and/or aseptic necrosis of one or more joints. Finally, the patient was informed that Medicine is not an exact science; therefore, there  is also the possibility of unforeseen or unpredictable risks and/or possible complications that may result in a catastrophic outcome. The patient indicated having understood very clearly. We have given the patient no guarantees and we have made no promises. Enough time was given to the patient to ask questions, all of which were answered to the patient's satisfaction. [name] has indicated that she wanted to continue with the procedure. Attestation: I, the ordering provider, attest that I have discussed with the patient the benefits, risks, side-effects, alternatives, likelihood of achieving goals, and potential problems during recovery for the procedure that I have provided informed consent. Date: 07/11/2016; Time: 10:18 AM  Pre-Procedure Preparation:  Monitoring: As per clinic protocol. Respiration, ETCO2, SpO2, BP, heart rate and rhythm monitor placed and checked for adequate function Safety Precautions: Patient was assessed for positional comfort and pressure points before starting the procedure. Time-out: I initiated and conducted the "Time-out" before starting the procedure, as per protocol. The patient was asked to participate by confirming the accuracy of the "Time Out" information. Verification of the correct person, site, and procedure were performed and confirmed by me, the nursing staff, and the patient. "Time-out" conducted as per Joint Commission's Universal Protocol (UP.01.01.01). "Time-out" Date & Time: 07/11/2016; 1036 hrs.  Description of Procedure Process:   Position: Prone Target Area: For Lumbar Facet blocks, the target is the groove formed by the junction of the transverse process and superior articular process. For the L5 dorsal ramus, the target is the notch between superior articular process and sacral ala. For the S1 dorsal ramus, the target is the superior and lateral edge of the posterior S1 Sacral foramen. Approach: Paramedial approach. Area Prepped: Entire Posterior  Lumbosacral Region Prepping solution: ChloraPrep (2% chlorhexidine gluconate and 70% isopropyl alcohol) Safety Precautions: Aspiration looking for blood return was conducted prior to all injections. At no point did we inject any substances, as a needle was being advanced. No attempts were made at seeking any paresthesias. Safe injection practices and needle disposal techniques used. Medications properly checked for expiration dates. SDV (single dose vial) medications used. Description of the Procedure: Protocol guidelines were followed. The patient was placed in position over the  fluoroscopy table. The target area was identified and the area prepped in the usual manner. Skin desensitized using vapocoolant spray. Skin & deeper tissues infiltrated with local anesthetic. Appropriate amount of time allowed to pass for local anesthetics to take effect. The procedure needle was introduced through the skin, ipsilateral to the reported pain, and advanced to the target area. Employing the "Medial Branch Technique", the needles were advanced to the angle made by the superior and medial portion of the transverse process, and the lateral and inferior portion of the superior articulating process of the targeted vertebral bodies. This area is known as "Burton's Eye" or the "Eye of the [place] Dog". A procedure needle was introduced through the skin, and this time advanced to the angle made by the superior and medial border of the sacral ala, and the lateral border of the S1 vertebral body. This last needle was later repositioned at the superior and lateral border of the posterior S1 foramen. Negative aspiration confirmed. Solution injected in intermittent fashion, asking for systemic symptoms every 0.5cc of injectate. The needles were then removed and the area cleansed, making sure to leave some of the prepping solution back to take advantage of its long term bactericidal properties.   Illustration of the posterior view of  the lumbar spine and the posterior neural structures. Laminae of L2 through S1 are labeled. DPRL5, dorsal primary ramus of L5; DPRS1, dorsal primary ramus of S1; DPR3, dorsal primary ramus of L3; FJ, facet (zygapophyseal) joint L3-L4; I, inferior articular process of L4; LB1, lateral branch of dorsal primary ramus of L1; IAB, inferior articular branches from L3 medial branch (supplies L4-L5 facet joint); IBP, intermediate branch plexus; MB3, medial branch of dorsal primary ramus of L3; NR3, third lumbar nerve root; S, superior articular process of L5; SAB, superior articular branches from L4 (supplies L4-5 facet joint also); TP3, transverse process of L3.  Vitals:   07/11/16 1046 07/11/16 1055 07/11/16 1105 07/11/16 1110  BP: (!) 137/97 117/84 109/84 110/86  Pulse: 95 94 94 94  Resp: 18 14 18 16   Temp:   97.8 F (36.6 C)   SpO2: 90% 93% 98% 94%  Weight:      Height:        Start Time: 1037 hrs. End Time: 1046 hrs. Materials:  Needle(s) Type: Regular needle Gauge: 22G Length: 7-in Medication(s): We administered lactated ringers, midazolam, fentaNYL, triamcinolone acetonide, ropivacaine (PF) 2 mg/mL (0.2%), ropivacaine (PF) 2 mg/mL (0.2%), and triamcinolone acetonide. Please see chart orders for dosing details.  Imaging Guidance (Spinal):  Type of Imaging Technique: Fluoroscopy Guidance (Spinal) Indication(s): Assistance in needle guidance and placement for procedures requiring needle placement in or near specific anatomical locations not easily accessible without such assistance. Exposure Time: Please see nurses notes. Contrast: None used. Fluoroscopic Guidance: I was personally present during the use of fluoroscopy. "Tunnel Vision Technique" used to obtain the best possible view of the target area. Parallax error corrected before commencing the procedure. "Direction-depth-direction" technique used to introduce the needle under continuous pulsed fluoroscopy. Once target was reached,  antero-posterior, oblique, and lateral fluoroscopic projection used confirm needle placement in all planes. Images permanently stored in EMR. Interpretation: No contrast injected. I personally interpreted the imaging intraoperatively. Adequate needle placement confirmed in multiple planes. Permanent images saved into the patient's record.  Antibiotic Prophylaxis:  Indication(s): None identified Antibiotic given: None  Post-operative Assessment:  EBL: None Complications: No immediate post-treatment complications observed by team, or reported by patient. Note: The patient tolerated the entire  procedure well. A repeat set of vitals were taken after the procedure and the patient was kept under observation following institutional policy, for this type of procedure. Post-procedural neurological assessment was performed, showing return to baseline, prior to discharge. The patient was provided with post-procedure discharge instructions, including a section on how to identify potential problems. Should any problems arise concerning this procedure, the patient was given instructions to immediately contact us, at any time, without hesitation. In any case, we plan to contact the patient by telephone for a follow-up status report regarding this interventional procedure. Comments:  No additional relevant information.  Plan of Care  Disposition: Discharge home  Discharge Date & Time: 07/11/2016; 1114 hrs.  Physician-requested Follow-up:  Return for post-procedure eval (in 2 wks).  Future Appointments Date Time Provider [place]  08/02/2016 2:30 PM Milinda Pointer, MD [facility] None  09/07/2016 10:15 AM Milinda Pointer, MD [facility] None   Medications ordered for procedure: Meds ordered this encounter  Medications  . lactated ringers infusion 1,000 mL  . midazolam (VERSED) 5 MG/5ML injection 1-2 mg    Make sure Flumazenil is available in the pyxis when using this medication. If oversedation  occurs, administer 0.2 mg IV over 15 sec. If after 45 sec no response, administer 0.2 mg again over 1 min; may repeat at 1 min intervals; not to exceed 4 doses (1 mg)  . fentaNYL (SUBLIMAZE) injection 25-50 mcg    Make sure Narcan is available in the pyxis when using this medication. In the event of respiratory depression (RR< 8/min): Titrate NARCAN (naloxone) in increments of 0.1 to 0.2 mg IV at 2-3 minute intervals, until desired degree of reversal.  . lidocaine 1.5 % injection 20 mL    From block tray  . triamcinolone acetonide (KENALOG-40) injection 40 mg  . ropivacaine (PF) 2 mg/mL (0.2%) (NAROPIN) injection 9 mL  . ropivacaine (PF) 2 mg/mL (0.2%) (NAROPIN) injection 9 mL  . triamcinolone acetonide (KENALOG-40) injection 40 mg   Medications administered: We administered lactated ringers, midazolam, fentaNYL, triamcinolone acetonide, ropivacaine (PF) 2 mg/mL (0.2%), ropivacaine (PF) 2 mg/mL (0.2%), and triamcinolone acetonide.  See the medical record for exact dosing, route, and time of administration.  Lab-work, Procedure(s), & Referral(s) Ordered: Orders Placed This Encounter  Procedures  . DG C-Arm 1-60 Min-No Report   Imaging Ordered: Results for orders placed in visit on 06/06/16  DG C-Arm 1-60 Min-No Report   Narrative Fluoroscopy was utilized by the requesting physician.  No radiographic  interpretation.    New Prescriptions   No medications on file   Primary Care Physician: Ardine Eng, MD Location: [facility] Note by: Kathlen Brunswick. Dossie Arbour, M.D, DABA, DABAPM, DABPM, DABIPP, FIPP Date: 07/11/2016; Time: 2:06 PM  Disclaimer:  Medicine is not an Chief Strategy Officer. The only guarantee in medicine is that nothing is guaranteed. It is important to note that the decision to proceed with this intervention was based on the information collected from the patient. The Data and conclusions were drawn from the patient's questionnaire, the interview, and the  physical examination. Because the information was provided in large part by the patient, it cannot be guaranteed that it has not been purposely or unconsciously manipulated. Every effort has been made to obtain as much relevant data as possible for this evaluation. It is important to note that the conclusions that lead to this procedure are derived in large part from the available data. Always take into account that the treatment will also be  dependent on availability of resources and existing treatment guidelines, considered by other Pain Management Practitioners as being common knowledge and practice, at the time of the intervention. For Medico-Legal purposes, it is also important to point out that variation in procedural techniques and pharmacological choices are the acceptable norm. The indications, contraindications, technique, and results of the above procedure should only be interpreted and judged by a Board-Certified Interventional Pain Specialist with extensive familiarity and expertise in the same exact procedure and technique.  Instructions provided at this appointment: Patient Instructions  ____________________________________________________________________________________________  Post-Procedure instructions Instructions:  Apply ice: Fill a plastic sandwich bag with crushed ice. Cover it with a small towel and apply to injection site. Apply for 15 minutes then remove x 15 minutes. Repeat sequence on day of procedure, until you go to bed. The purpose is to minimize swelling and discomfort after procedure.  Apply heat: Apply heat to procedure site starting the day following the procedure. The purpose is to treat any soreness and discomfort from the procedure.  Food intake: Start with clear liquids (like water) and advance to regular food, as tolerated.   Physical activities: Keep activities to a minimum for the first 8 hours after the procedure.   Driving: If you have received any  sedation, you are not allowed to drive for 24 hours after your procedure.  Blood thinner: Restart your blood thinner 6 hours after your procedure. (Only for those taking blood thinners)  Insulin: As soon as you can eat, you may resume your normal dosing schedule. (Only for those taking insulin)  Infection prevention: Keep procedure site clean and dry.  Post-procedure Pain Diary: Extremely important that this be done correctly and accurately. Recorded information will be used to determine the next step in treatment.  Pain evaluated is that of treated area only. Do not include pain from an untreated area.  Complete every hour, on the hour, for the initial 8 hours. Set an alarm to help you do this part accurately.  Do not go to sleep and have it completed later. It will not be accurate.  Follow-up appointment: Keep your follow-up appointment after the procedure. Usually 2 weeks for most procedures. (6 weeks in the case of radiofrequency.) Bring you pain diary.  Expect:  From numbing medicine (AKA: Local Anesthetics): Numbness or decrease in pain.  Onset: Full effect within 15 minutes of injected.  Duration: It will depend on the type of local anesthetic used. On the average, 1 to 8 hours.   From steroids: Decrease in swelling or inflammation. Once inflammation is improved, relief of the pain will follow.  Onset of benefits: Depends on the amount of swelling present. The more swelling, the longer it will take for the benefits to be seen. In some cases, up to 10 days.  Duration: Steroids will stay in the system x 2 weeks. Duration of benefits will depend on multiple posibilities including persistent irritating factors.  From procedure: Some discomfort is to be expected once the numbing medicine wears off. This should be minimal if ice and heat are applied as instructed. Call if:  You experience numbness and weakness that gets worse with time, as opposed to wearing off.  New onset bowel  or bladder incontinence. (Spinal procedures only)  Emergency Numbers:  [place] business hours (Monday - Thursday, 8:00 AM - 4:00 PM) (Friday, 9:00 AM - 12:00 Noon): [phone]  After hours: [phone] ____________________________________________________________________________________________

## 2016-07-11 NOTE — Progress Notes (Signed)
Safety precautions to be maintained throughout the outpatient stay will include: orient to surroundings, keep bed in low position, maintain call bell within reach at all times, provide assistance with transfer out of bed and ambulation.  

## 2016-07-12 ENCOUNTER — Emergency Department: Payer: Medicaid Other

## 2016-07-12 ENCOUNTER — Emergency Department
Admission: EM | Admit: 2016-07-12 | Discharge: 2016-07-12 | Disposition: A | Discharge status: Home / Self Care | Payer: Medicaid Other | Attending: Emergency Medicine | Admitting: Emergency Medicine

## 2016-07-12 ENCOUNTER — Telehealth: Payer: Self-pay

## 2016-07-12 DIAGNOSIS — G8929 Other chronic pain: Secondary | ICD-10-CM | POA: Insufficient documentation

## 2016-07-12 DIAGNOSIS — I129 Hypertensive chronic kidney disease with stage 1 through stage 4 chronic kidney disease, or unspecified chronic kidney disease: Secondary | ICD-10-CM | POA: Insufficient documentation

## 2016-07-12 DIAGNOSIS — E1165 Type 2 diabetes mellitus with hyperglycemia: Secondary | ICD-10-CM | POA: Diagnosis not present

## 2016-07-12 DIAGNOSIS — J449 Chronic obstructive pulmonary disease, unspecified: Secondary | ICD-10-CM | POA: Diagnosis not present

## 2016-07-12 DIAGNOSIS — Z794 Long term (current) use of insulin: Secondary | ICD-10-CM | POA: Diagnosis not present

## 2016-07-12 DIAGNOSIS — J45909 Unspecified asthma, uncomplicated: Secondary | ICD-10-CM | POA: Diagnosis not present

## 2016-07-12 DIAGNOSIS — N189 Chronic kidney disease, unspecified: Secondary | ICD-10-CM | POA: Insufficient documentation

## 2016-07-12 DIAGNOSIS — E1122 Type 2 diabetes mellitus with diabetic chronic kidney disease: Secondary | ICD-10-CM | POA: Diagnosis not present

## 2016-07-12 DIAGNOSIS — E039 Hypothyroidism, unspecified: Secondary | ICD-10-CM | POA: Diagnosis not present

## 2016-07-12 DIAGNOSIS — M545 Other chronic pain: Secondary | ICD-10-CM

## 2016-07-12 DIAGNOSIS — Z79899 Other long term (current) drug therapy: Secondary | ICD-10-CM | POA: Insufficient documentation

## 2016-07-12 DIAGNOSIS — E860 Dehydration: Secondary | ICD-10-CM | POA: Insufficient documentation

## 2016-07-12 DIAGNOSIS — R739 Hyperglycemia, unspecified: Secondary | ICD-10-CM

## 2016-07-12 DIAGNOSIS — R0602 Shortness of breath: Secondary | ICD-10-CM | POA: Diagnosis present

## 2016-07-12 LAB — CBC
HCT: 47.4 % — ABNORMAL HIGH (ref 35.0–47.0)
Hemoglobin: 16.5 g/dL — ABNORMAL HIGH (ref 12.0–16.0)
MCH: 35.7 pg — ABNORMAL HIGH (ref 26.0–34.0)
MCHC: 34.8 g/dL (ref 32.0–36.0)
MCV: 102.5 fL — ABNORMAL HIGH (ref 80.0–100.0)
Platelets: 139 10*3/uL — ABNORMAL LOW (ref 150–440)
RBC: 4.62 MIL/uL (ref 3.80–5.20)
RDW: 13.4 % (ref 11.5–14.5)
WBC: 9.2 10*3/uL (ref 3.6–11.0)

## 2016-07-12 LAB — URINALYSIS, COMPLETE (UACMP) WITH MICROSCOPIC
Bacteria, UA: NONE SEEN
Bilirubin Urine: NEGATIVE
Glucose, UA: >=500 mg/dL — AB
Hgb urine dipstick: NEGATIVE
Ketones, ur: NEGATIVE mg/dL
Leukocytes, UA: NEGATIVE
Nitrite: NEGATIVE
Protein, ur: NEGATIVE mg/dL
Specific Gravity, Urine: 1.032 — ABNORMAL HIGH (ref 1.005–1.030)
pH: 6 (ref 5.0–8.0)

## 2016-07-12 LAB — BASIC METABOLIC PANEL
Anion gap: 12 (ref 5–15)
BUN: 18 mg/dL (ref 6–20)
CO2: 25 mmol/L (ref 22–32)
Calcium: 10.6 mg/dL — ABNORMAL HIGH (ref 8.9–10.3)
Chloride: 99 mmol/L — ABNORMAL LOW (ref 101–111)
Creatinine, Ser: 0.77 mg/dL (ref 0.44–1.00)
GFR calc Af Amer: >60 mL/min (ref >60)
GFR calc non Af Amer: >60 mL/min (ref >60)
Glucose, Bld: 530 mg/dL (ref 65–99)
Potassium: 4.3 mmol/L (ref 3.5–5.1)
Sodium: 136 mmol/L (ref 135–145)

## 2016-07-12 LAB — GLUCOSE, CAPILLARY
Glucose-Capillary: 524 mg/dL (ref 65–99)
Glucose-Capillary: 450 mg/dL — ABNORMAL HIGH (ref 65–99)
Glucose-Capillary: 484 mg/dL — ABNORMAL HIGH (ref 65–99)

## 2016-07-12 LAB — TROPONIN I: Troponin I: <0.03 ng/mL (ref <0.03)

## 2016-07-12 MED ORDER — ONDANSETRON HCL 4 MG/2ML IJ SOLN
4.0000 mg | Freq: Once | INTRAMUSCULAR | Status: AC
  Administered 2016-07-12: 4 mg via INTRAVENOUS

## 2016-07-12 MED ORDER — SODIUM CHLORIDE 0.9 % IV BOLUS (SEPSIS)
1000.0000 mL | Freq: Once | INTRAVENOUS | Status: AC
  Administered 2016-07-12: 1000 mL via INTRAVENOUS

## 2016-07-12 MED ORDER — INSULIN ASPART 100 UNIT/ML ~~LOC~~ SOLN
10.0000 [IU] | Freq: Once | SUBCUTANEOUS | Status: AC
  Administered 2016-07-12: 10 [IU] via INTRAVENOUS
  Filled 2016-07-12: qty 1

## 2016-07-12 NOTE — ED Triage Notes (Signed)
Pt c/o SOB with chest pain that started about an hour PTA, pt also c/o head and neck pain. Pt had injections at the pain clinic yesterday.

## 2016-07-12 NOTE — ED Notes (Signed)
Gave patient a blanket.

## 2016-07-12 NOTE — ED Notes (Signed)
CBG done 484

## 2016-07-12 NOTE — Discharge Instructions (Signed)
Your blood and urine tests today were unremarkable.  Continue to use your sliding scale insulin to control your elevated blood sugar, and drink lots of water to stay hydrated.  Your blood sugar should return to normal soon after the effects of the steroid injections subside. Follow up with your doctor this week for continued monitoring of your symptoms.

## 2016-07-12 NOTE — Telephone Encounter (Signed)
Post procedure phone call.  Patient states she is doing pretty good.

## 2016-07-12 NOTE — ED Provider Notes (Signed)
[facility] Emergency Department Provider Note  ____________________________________________  Time seen: Approximately 3:47 PM  I have reviewed the triage vital signs and the nursing notes.   HISTORY  Chief Complaint Shortness of Breath and Back Pain  Level 5 caveat:  Portions of the history and physical were unable to be obtained due to: poor historian   HPI Lori Mckenzie is a 51 y.o. female who comes to the ED with dehydration, polyuria polydipsia dry mouth dizziness and fatigue that started this morning. She went to pain management clinic yesterday for evaluation of her chronic back pain. They did lumbar facet injections withanesthetic and steroids. She's been using her insulin as usual and following her usual diet, but today her blood sugar was much higher.  The triage notereports shortness of breath and chest pain that started an hour prior to arrival. The patient does not report that to me and denies chest pain completely. She does report a multitude of chronic complaints including generalized abdominal pain and lower back pain which are unchanged from baseline. Patient denies exertional symptoms cough shortness of breath fever chills or syncope. No palpitations.     Past Medical History:  Diagnosis Date  . Abdominal abscess 08/25/2014  . Acid reflux 08/10/2010  . Acute cervical myofascial strain 02/09/2016  . Anxiety   . Ascites   . Asthma   . Back pain   . Bile leak, postoperative 07/17/2014  . Brittle bone disease   . Cancer ([place])    Uteriine  ca 28yr ago partial hysterectomy  . Cervical disc disease   . Chronic kidney disease   . Collagen vascular disease ([place]    RA  3-4 yrs ago  . COPD (chronic obstructive pulmonary disease) ([place]   . Diabetes mellitus without complication ([place]   . GERD (gastroesophageal reflux disease)   . Hypertension   . Hypothyroidism   . Left upper quadrant pain 01/09/2014  . Major depressive disorder with single episode  12/05/2011  . Major depressive disorder, single episode 12/05/2011  . Migraines   . NASH (nonalcoholic steatohepatitis)   . Respiratory infection    2/17  . Shock ([place] 09/18/2014  . Sleep apnea   . Sleep apnea   . Syncope 11/16/2014  . Thyroid disease   . TIA (transient ischemic attack)      Patient Active Problem List   Diagnosis Date Noted  . Low back pain due to L1-2 disc extrusion (caudal) (Left) 07/11/2016  . Abdominal pain 06/28/2016  . Constipation 06/20/2016  . Hyperglycemia 06/20/2016  . Nausea without vomiting 06/06/2016  . Chronic pain of lower extremity, bilateral 05/19/2016  . Spinal stenosis, thoracic region (T10-11) 05/19/2016  . Diabetes mellitus, insulin dependent (IDDM), uncontrolled ([place] 04/12/2016  . Chronic sacroiliac joint pain (Bilateral) (L>R) 03/23/2016  . Chronic pain of left upper extremity 03/10/2016  . Chronic radicular cervical pain (L) 03/10/2016  . Osteoarthritis 02/24/2016  . Allodynia 02/09/2016  . Chronic pain syndrome 01/07/2016  . Opiate withdrawal ([place] 12/22/2015  . Elevated liver enzymes 09/28/2015  . Cirrhosis of liver without ascites ([place] 09/24/2015  . Cirrhosis ([place] 09/24/2015  . Altered mental status 07/24/2015  . Uncontrolled diabetes mellitus ([place] 07/24/2015  . Radicular pain of thoracic region 04/06/2015  . Hepatic encephalopathy (HB[place] 03/11/2015  . Elevated sedimentation rate 03/05/2015  . Elevated C-reactive protein (CRP) 03/05/2015  . Lumbar facet syndrome (Location of Primary Source of Pain) (Bilateral) (R>L) 03/05/2015  . Cervical spondylosis 03/05/2015  . Chronic feet pain (Location  of Secondary source of pain) (Bilateral) (R>L) 03/05/2015  . Lumbar spondylosis 03/05/2015  . Encounter for chronic pain management 03/05/2015  . Chronic shoulder pain (Bilateral) (R>L) 03/05/2015  . Chronic carpal tunnel syndrome (Bilateral) 03/05/2015  . Chronic hip pain (Bilateral) (L>R) 03/05/2015  . Chronic upper back pain  (Bilateral) (L>R) 03/05/2015  . Osteoporosis, idiopathic 03/05/2015  . Abnormal MRI, lumbar spine (02/03/2015) 03/05/2015  . Thoracic radiculitis 02/05/2015  . Vitamin D deficiency 01/13/2015  . B12 deficiency 01/13/2015  . Folate deficiency 01/13/2015  . Subacute lumbar radiculopathy (left side) (S1 dermatome) 12/10/2014  . Drowsiness 11/16/2014  . Episode of syncope 11/16/2014  . Somnolence 11/16/2014  . Opiate use (75 MME/Day) 10/28/2014  . Long term prescription opiate use 10/28/2014  . Long term current use of opiate analgesic 10/28/2014  . Encounter for therapeutic drug level monitoring 10/28/2014  . Chronic epigastric abdominal pain 10/28/2014  . Chronic low back pain (Location of Primary Source of Pain) (Bilateral) (R>L) 10/28/2014  . Chronic neck pain (Location of Tertiary source of pain) (Bilateral) (R>L) 10/28/2014  . Ascites 09/05/2014  . NASH (nonalcoholic steatohepatitis) 09/05/2014  . Hypokalemia 09/05/2014  . Dysthymia 08/05/2014  . Other social stressor 08/05/2014  . Steatohepatitis 07/15/2014  . Type 2 diabetes mellitus ([place]) 07/12/2014  . COPD (chronic obstructive pulmonary disease) ([place]) 07/12/2014  . GERD (gastroesophageal reflux disease) 07/12/2014  . OSA on CPAP 07/12/2014  . Anxiety 07/12/2014  . 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 Lumbar and sacral osteoarthritis 03/26/2014  . Myofascial pain 03/26/2014  . Lumbar spinal stenosis 03/26/2014  . Lumbosacral spondylosis without myelopathy 03/26/2014  . 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 Spinal stenosis, lumbar region without neurogenic claudication (L1-2 and L4-5) 03/26/2014  . Spondylosis of lumbar region without myelopathy or radiculopathy 03/26/2014  . Breath shortness 01/09/2014  . Diastolic dysfunction 85/46/2703  . Airway hyperreactivity 08/06/2013  . Essential (primary) hypertension 08/06/2013  . Asthma 08/06/2013  . Clinical depression 12/05/2011     Past Surgical  History:  Procedure Laterality Date  . ABDOMINAL HYSTERECTOMY    . CHOLECYSTECTOMY N/A 07/15/2014   Procedure: LAPAROSCOPIC CHOLECYSTECTOMY with liver biopsy ;  Surgeon: Sherri Rad, MD;  Location: [facility] ORS;  Service: General;  Laterality: N/A;  . COLONOSCOPY WITH PROPOFOL N/A 06/23/2014   Procedure: COLONOSCOPY WITH PROPOFOL;  Surgeon: Lollie Sails, MD;  Location: [facility] ENDOSCOPY;  Service: Endoscopy;  Laterality: N/A;  . ERCP N/A 07/16/2014   Procedure: ENDOSCOPIC RETROGRADE CHOLANGIOPANCREATOGRAPHY (ERCP);  Surgeon: Clarene Essex, MD;  Location: [facility] ENDOSCOPY;  Service: Endoscopy;  Laterality: N/A;  . ERCP N/A 10/03/2014   Procedure: ENDOSCOPIC RETROGRADE CHOLANGIOPANCREATOGRAPHY (ERCP);  Surgeon: Hulen Luster, MD;  Location: [facility] ENDOSCOPY;  Service: Gastroenterology;  Laterality: N/A;  . ESOPHAGOGASTRODUODENOSCOPY N/A 06/23/2014   Procedure: ESOPHAGOGASTRODUODENOSCOPY (EGD);  Surgeon: Lollie Sails, MD;  Location: [facility] ENDOSCOPY;  Service: Endoscopy;  Laterality: N/A;  . ESOPHAGOGASTRODUODENOSCOPY N/A 06/30/2016   Procedure: ESOPHAGOGASTRODUODENOSCOPY (EGD);  Surgeon: Jonathon Bellows, MD;  Location: [facility] ENDOSCOPY;  Service: Endoscopy;  Laterality: N/A;  . ESOPHAGOGASTRODUODENOSCOPY (EGD) WITH PROPOFOL N/A 12/01/2015   Procedure: ESOPHAGOGASTRODUODENOSCOPY (EGD) WITH PROPOFOL;  Surgeon: Lollie Sails, MD;  Location: [facility] ENDOSCOPY;  Service: Endoscopy;  Laterality: N/A;  . TUBAL LIGATION    . WISDOM TOOTH EXTRACTION       Prior to Admission medications   Medication Sig Start Date End Date Taking? Authorizing Provider  ALPRAZolam Duanne Moron) 1 MG tablet Take 1 mg by mouth 3 (three) times daily.  [provider]  Cholecalciferol (VITAMIN D-1000 MAX ST) 1000 units tablet Take 2,000 Units by mouth daily.     [provider]  citalopram (CELEXA) 20 MG tablet Take 30 mg by mouth daily.  07/24/15   [provider]  diclofenac sodium (VOLTAREN) 1 % GEL Apply 4 g topically 4  (four) times daily. 04/12/16 10/09/16  Milinda Pointer, MD  dicyclomine (BENTYL) 10 MG capsule Take 10 mg by mouth 4 (four) times daily -  before meals and at bedtime.     [provider]  fluticasone (FLONASE) 50 MCG/ACT nasal spray Place 1-2 sprays into both nostrils daily.     [provider]  Folate-B12-Intrinsic Factor (INTRINSI B12-FOLATE) [phone] MCG-MCG-MG TABS Take 1 tablet by mouth daily. Reported on 04/06/2015    [provider]  furosemide (LASIX) 40 MG tablet Take 40 mg by mouth daily.     [provider]  gabapentin (NEURONTIN) 300 MG capsule Take 600 mg by mouth 4 (four) times daily as needed (for pain).     [provider]  insulin aspart (NOVOLOG) 100 UNIT/ML injection 30 units with meals 3 times daily plus sliding scale. 06/03/16   [provider]  Insulin Detemir (LEVEMIR FLEXPEN [place]) Inject 50 Units into the skin 2 (two) times daily.     [provider]  ipratropium-albuterol (DUONEB) 0.5-2.5 (3) MG/3ML SOLN Take 3 mLs by nebulization every 4 (four) hours as needed.    [provider]  lactulose (CHRONULAC) 10 GM/15ML solution Take 30 g by mouth daily.  03/13/15   [provider]  lactulose (CHRONULAC) 10 GM/15ML solution Take by mouth. 07/04/16 07/14/16  [provider]  naloxone Karma Greaser) 2 MG/2ML injection Inject into the vein as needed.    [provider]  nystatin (MYCOSTATIN) powder Apply 1 g topically 3 (three) times daily as needed (for irritation).     [provider]  oxyCODONE (OXY IR/ROXICODONE) 5 MG immediate release tablet Take 5 mg by mouth every 6 (six) hours as needed for severe pain.    [provider]  potassium chloride SA (K-DUR,KLOR-CON) 20 MEQ tablet Take 10-20 mEq by mouth daily.     [provider]  promethazine (PHENERGAN) 12.5 MG tablet Take 12.5 mg by mouth every 6 (six) hours as needed for nausea or vomiting.     [provider]  rifaximin (XIFAXAN) 550 MG TABS tablet Take 550 mg by mouth 2 (two) times daily.     [provider]  rizatriptan (MAXALT) 10 MG tablet Take 10 mg by mouth as needed for migraine.     [provider]  spironolactone (ALDACTONE) 100 MG tablet Take 100 mg by mouth daily.     [provider]  vitamin B-12 (CYANOCOBALAMIN) 1000 MCG tablet Take 2,000 mcg by mouth daily.    [provider]     Allergies Tape and Vicodin [hydrocodone-acetaminophen]   Family History  Problem Relation Age of Onset  . Lung cancer Mother   . Ulcers Father   . Emphysema Father   . Heart disease Sister   . Ulcers Sister   . Heart disease Brother     Social History Social History  Substance Use Topics  . Smoking status: Never Smoker  . Smokeless tobacco: Never Used  . Alcohol use No     Comment: occ    Review of Systems  Constitutional:   No fever or chills.  ENT:   No sore throat. No rhinorrhea.  Cardiovascular:   No chest pain or syncope. Respiratory:   No dyspnea or cough. Gastrointestinal:   Negative for abdominal pain, vomiting and diarrhea.  Musculoskeletal:   Negative for focal pain or swelling All other systems reviewed and are negative except as documented above in ROS and HPI.  ____________________________________________   PHYSICAL EXAM:  VITAL SIGNS: ED Triage Vitals  Enc Vitals Group     BP 07/12/16 1439 (!) 145/94     Pulse Rate 07/12/16 1437 (!) 115     Resp 07/12/16 1437 18     Temp 07/12/16 1437 98.6 F (37 C)     Temp Source 07/12/16 1437 Oral     SpO2 07/12/16 1437 96 %     Weight 07/12/16 1439 260 lb (117.9 kg)     Height 07/12/16 1439 5' 2"  (1.575 m)     Head Circumference --      Peak Flow --      Pain Score 07/12/16 1437 8     Pain Loc --      Pain Edu? --      Excl. in [place]? --     Vital signs reviewed, nursing assessments reviewed.   Constitutional:   Alert and oriented. Well appearing and in no distress. Eyes:   No  scleral icterus.  EOMI. No nystagmus. No conjunctival pallor. PERRL. ENT   Head:   Normocephalic and atraumatic.   Nose:   No congestion/rhinnorhea.    Mouth/Throat:   Dry mucous membranes, no pharyngeal erythema. No peritonsillar mass.    Neck:   No meningismus. Full ROM Hematological/Lymphatic/Immunilogical:   No cervical lymphadenopathy. Cardiovascular:   Tachycardia heart rate 110. Symmetric bilateral radial and DP pulses.  No murmurs.  Respiratory:   Normal respiratory effort without tachypnea/retractions. Breath sounds are clear and equal bilaterally. No wheezes/rales/rhonchi. Gastrointestinal:   Soft without focal tendernessr. Non distended. There is no CVA tenderness.  No rebound, rigidity, or guarding. Genitourinary:   deferred Musculoskeletal:   Normal range of motion in all extremities. No joint effusions.  No lower extremity tenderness.  No edema. Neurologic:   Normal speech and language.  Motor grossly intact. No gross focal neurologic deficits are appreciated.  Skin:    Skin is warm, dry and intact. No rash noted.  No petechiae, purpura, or bullae.  ____________________________________________    LABS (pertinent positives/negatives) (all labs ordered are listed, but only abnormal results are displayed) Labs Reviewed  BASIC METABOLIC PANEL - Abnormal; Notable for the following:       Result Value   Chloride 99 (*)    Glucose, Bld 530 (*)    Calcium 10.6 (*)    All other components within normal limits  CBC - Abnormal; Notable for the following:    Hemoglobin 16.5 (*)    HCT 47.4 (*)    MCV 102.5 (*)    MCH 35.7 (*)    Platelets 139 (*)    All other components within normal limits  GLUCOSE, CAPILLARY - Abnormal; Notable for the following:    Glucose-Capillary 524 (*)    All other components within normal limits  URINE CULTURE  TROPONIN I  URINALYSIS, COMPLETE (UACMP) WITH MICROSCOPIC    ____________________________________________   EKG  Interpreted by me Sinus tachycardia rate 114, normal axis and intervals. Voltage criteria for LVH in the high lateral leads.Poor R-wave progression in anterior precordial leads. Normal ST segments and T waves.  ____________________________________________    RADIOLOGY  Dg Chest 2 View  Result Date: 07/12/2016  CLINICAL DATA:  Short of breath, chest pain today EXAM: CHEST  2 VIEW COMPARISON:  Chest x-ray of 06/24/2015 FINDINGS: Somewhat prominent interstitial markings may well be chronic in nature. No definite active process is seen. No pneumonia or effusion is noted. Mediastinal and hilar contours are unremarkable and cardiomegaly is stable. No acute bony abnormality is noted. IMPRESSION: Probable mild chronic interstitial change. No definite active process. Stable mild cardiomegaly Electronically Signed   By: Ivar Drape M.D.   On: 07/12/2016 15:17    ____________________________________________   PROCEDURES Procedures  ____________________________________________   INITIAL IMPRESSION / ASSESSMENT AND PLAN / ED COURSE  Pertinent labs & imaging results that were available during my care of the patient were reviewed by me and considered in my medical decision making (see chart for details).  Patient chronically ill but not in distress. Presents with terms of dehydration. Low suspicion for acute cardiac pulmonary or neurologic event. Low suspicion of ACS PE dissection or AAA abdominal perforation or obstruction or stroke. No evidence of soft tissue infection pneumonia meningitis or encephalitis. We'll give IV fluids, insulin for management of hyperglycemia and dehydration and reassess. I expect she will feel better and will be suitable for outpatient follow-up at that point.   ----------------------------------------- 5:44 PM on 07/12/2016 -----------------------------------------  Blood pressure stable. Tachycardia resolved.  Patient feeling better. We'll discharge home, continue sliding scale insulin, follow up with primary care.     ____________________________________________   FINAL CLINICAL IMPRESSION(S) / ED DIAGNOSES  Final diagnoses:  Hyperglycemia  Dehydration  Chronic bilateral low back pain without sciatica      New Prescriptions   No medications on file     Portions of this note were generated with dragon dictation software. Dictation errors may occur despite best attempts at proofreading.    Carrie Mew, MD 07/12/16 1745

## 2016-07-14 LAB — URINE CULTURE: Culture: 40000 — AB

## 2016-07-19 IMAGING — MR MR THORACIC SPINE W/O CM
6 series · 48 of 48 positions shown · non-contrast
Comparison: 08/06/2014

CLINICAL DATA: Diffuse mid back pain with bilateral arm and leg
pain since 3447.

EXAM:
MRI THORACIC SPINE WITHOUT CONTRAST
TECHNIQUE: Multiplanar, multisequence MR imaging of the thoracic spine was
performed. No intravenous contrast was administered.

[Series 4: T2 · sagittal · 4.0mm · 1.06mm/px · 5 of 13 slices shown (1 of 2)]
[im 1/13]
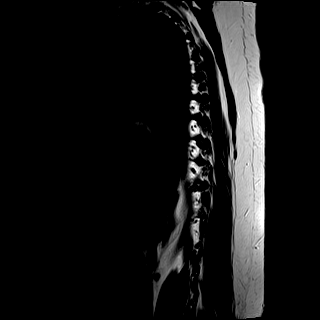
[im 4/13]
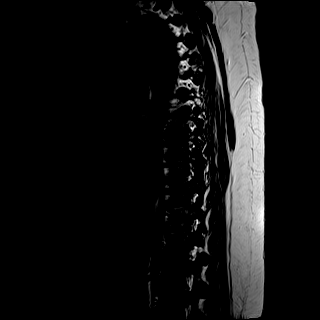
[im 7/13]
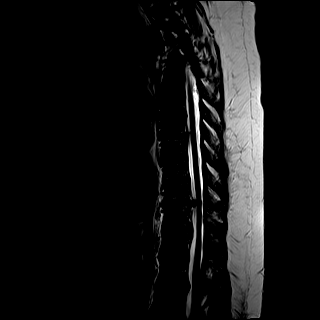
[im 10/13]
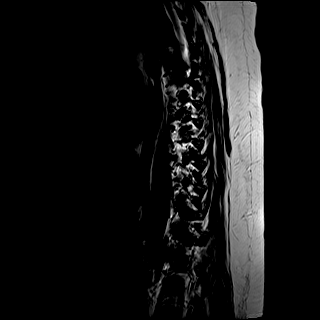
[im 13/13]
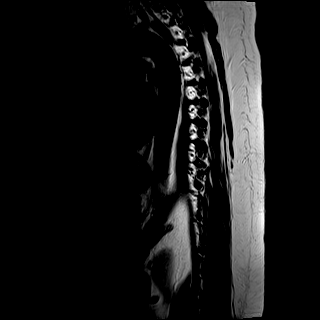

[Series 5: T1 · sagittal · 4.0mm · 1.06mm/px · 5 of 13 slices shown]
[im 1/13]
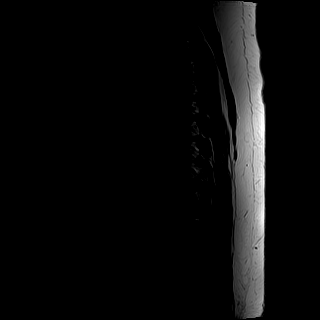
[im 4/13]
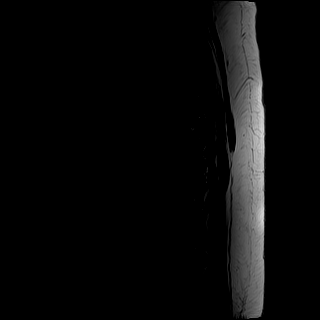
[im 7/13]
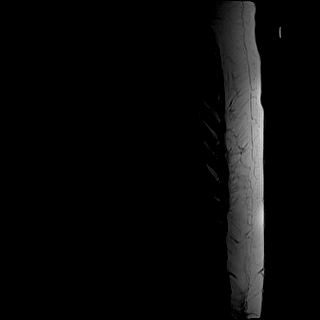
[im 10/13]
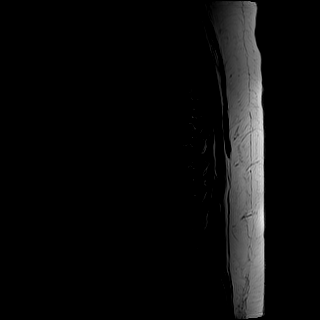
[im 13/13]
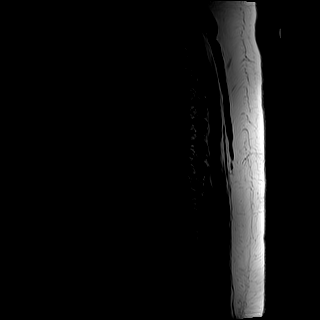

[Series 6: STIR · sagittal · 4.0mm · 1.33mm/px · 5 of 13 slices shown]
[im 1/13]
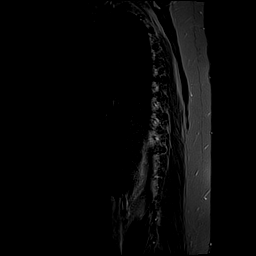
[im 4/13]
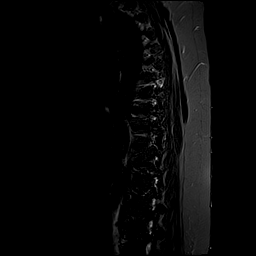
[im 7/13]
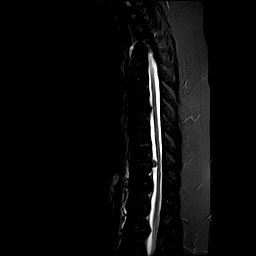
[im 10/13]
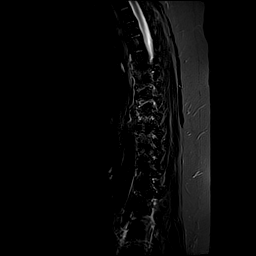
[im 13/13]
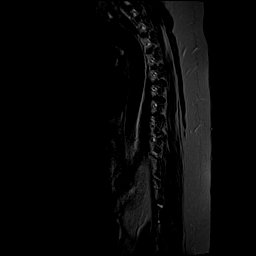

[Series 7: T2 · axial · 5.0mm · 0.86mm/px · z∈[-361,-133]mm · 15 of 39 slices shown (2 of 2)]
[im 1/39]
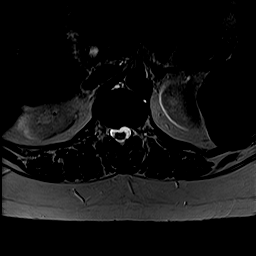
[im 3/39]
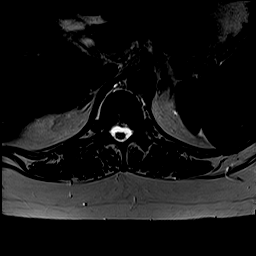
[im 6/39]
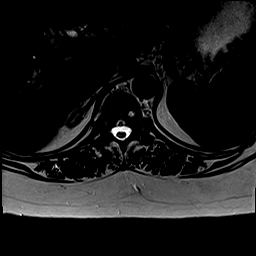
[im 9/39]
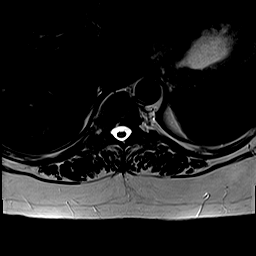
[im 11/39]
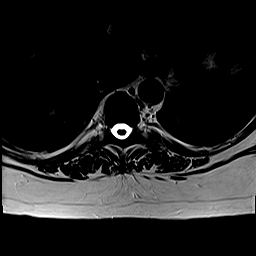
[im 14/39]
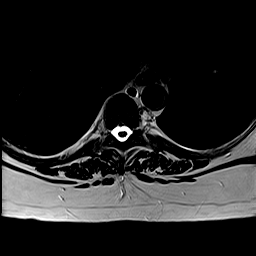
[im 17/39]
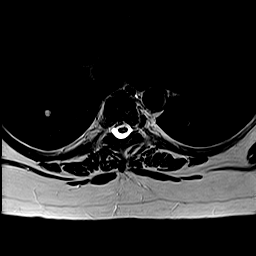
[im 20/39]
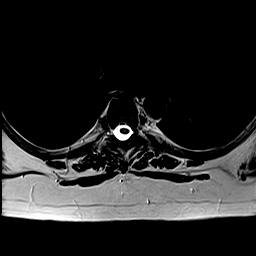
[im 22/39]
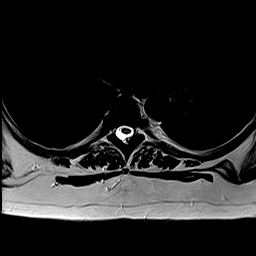
[im 25/39]
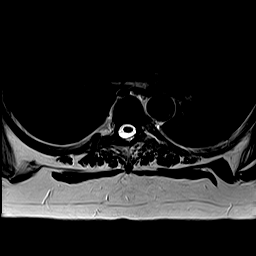
[im 28/39]
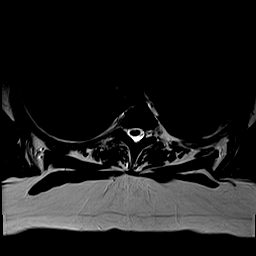
[im 30/39]
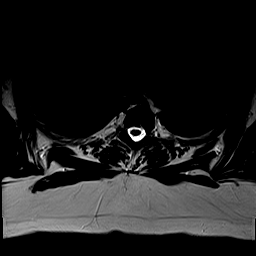
[im 33/39]
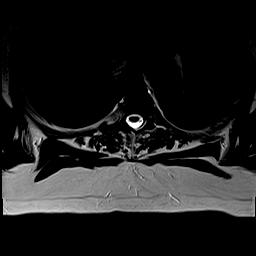
[im 36/39]
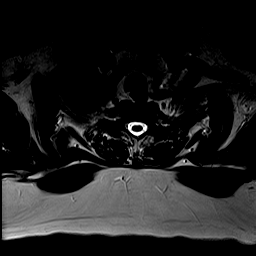
[im 39/39]
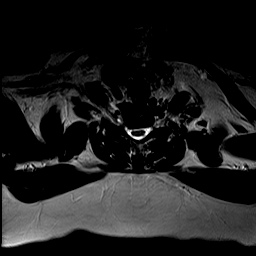

[Series 8: mpgr ax (3 · axial · 5.0mm · 0.78mm/px · z∈[-362,-129]mm · 15 of 39 slices shown]
[im 1/39]
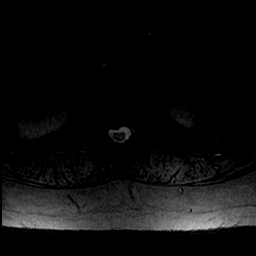
[im 3/39]
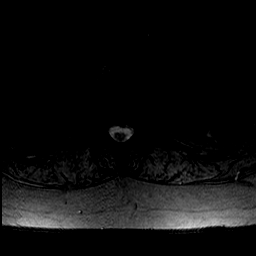
[im 6/39]
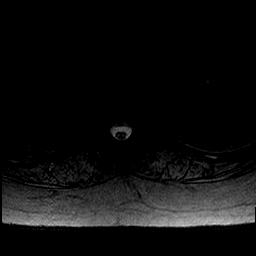
[im 9/39]
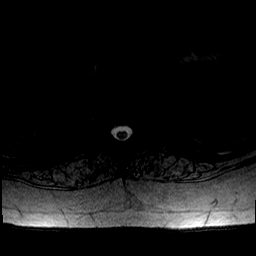
[im 11/39]
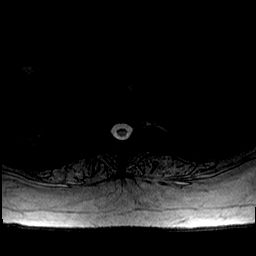
[im 14/39]
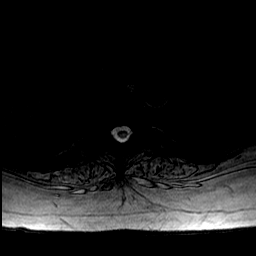
[im 17/39]
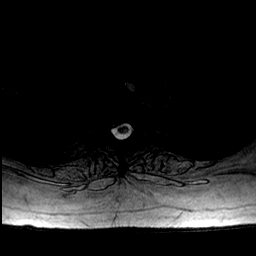
[im 20/39]
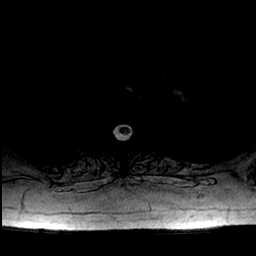
[im 22/39]
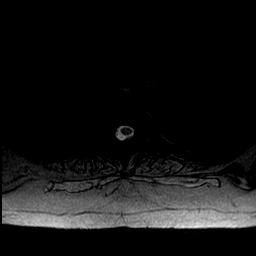
[im 25/39]
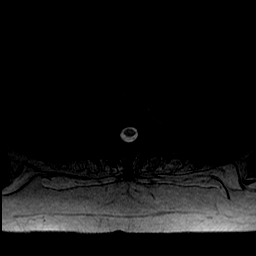
[im 28/39]
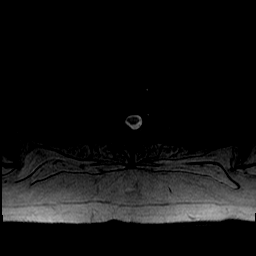
[im 30/39]
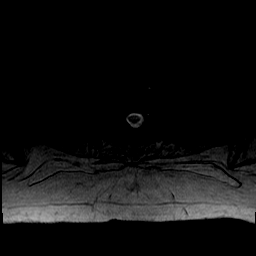
[im 33/39]
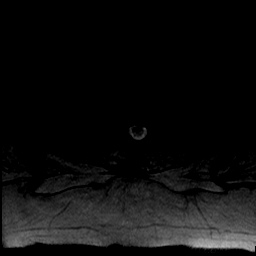
[im 36/39]
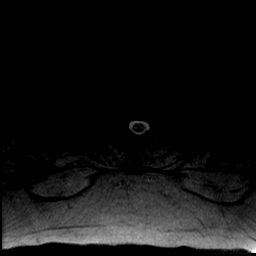
[im 39/39]
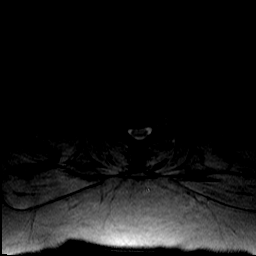

[Series 100: counting loc · sagittal · 5.0mm · 0.68mm/px · 3 of 7 slices shown]
[im 1/7]
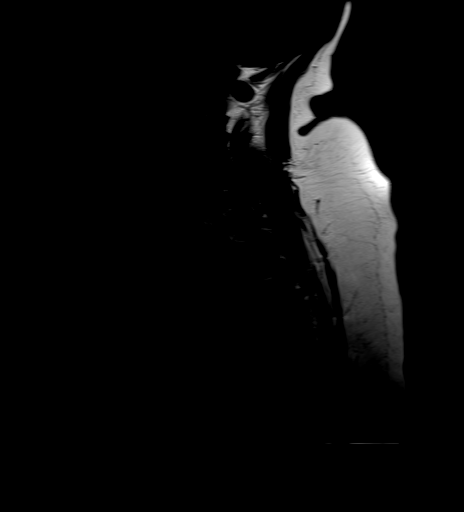
[im 4/7]
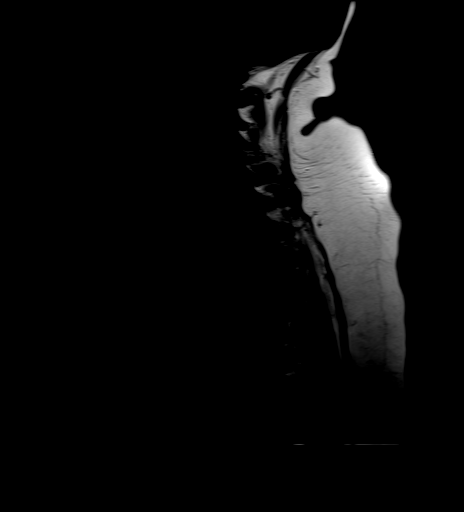
[im 7/7]
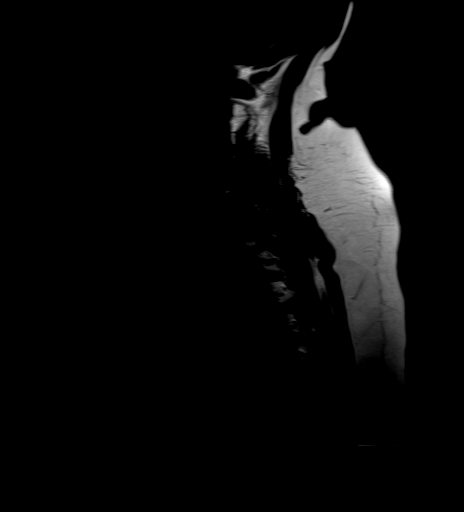

[48 of 48 positions shown; findings below may reference images not displayed]

FINDINGS: Small hemangiomas noted in the T3 and T11 vertebra. There is
increased T2 signal in the right transverse process of T6, probably
with increased T1 signal on image 3 series 5, and with trabecular
coarsening in this vicinity on prior exams including the chest CT
from 08/05/2007, favoring hemangioma.

Several scattered tiny T2 hyperintense lesions are present in the
liver and are nonspecific.

No significant abnormal spinal cord signal is observed. There is
mild dextroconvex thoracic scoliosis but no malalignment.

Several small Schmorl's nodes are present in the lower thoracic
spine.

Additional findings at individual levels are as follows:

T1-2:  No impingement.  Central disc protrusion.

T2-3: Moderate right foraminal stenosis due to right facet
arthropathy. Central disc protrusion.

T3-4:  Mild right foraminal stenosis due to facet arthropathy.

T4-5:  Mild right foraminal stenosis due to facet arthropathy.

T5-6: Mild left eccentric central narrowing of the thecal sac due to
left facet arthropathy, image 17 series 7.

T6-7: Mild left foraminal stenosis due to intervertebral spurring.
Disc bulge.

T7-8: Borderline central narrowing of the thecal sac due to focal
right paracentral spurring.

T8-9:  Mild bilateral foraminal stenosis due to facet arthropathy.

T9-10:  Borderline left foraminal stenosis due to facet arthropathy.

T10-11: Moderate central narrowing of the thecal sac due to a
central disc protrusion with associated spurring. Mild left
foraminal stenosis due to facet arthropathy.

T11-12: Mild left foraminal stenosis due to left paracentral disc
protrusion and facet spurring.

T12-L1:  No impingement.  Right paracentral disc protrusion.

L1-2: No impingement. Central disc protrusion. This level is only
included on the parasagittal images.
IMPRESSION: 1. Spondylosis and degenerative disc disease in the thoracic spine,
causing moderate impingement at T2-3 and T10-11 ; and mild
impingement at T3-4, T4-5, T5-6, T6-7, T8-9, and T11-12, as detailed
above.
2. Several vertebral hemangiomas, the largest of which is in the
right T6 transverse process.
3. Scattered tiny T2 hyperintense liver lesions are probably cysts
and have been seen on prior CT scans, but technically nonspecific on
today's MRI.

## 2016-07-24 IMAGING — DX DG CHEST 1V PORT
1 series · 1 of 1 positions shown · non-contrast
Comparison: Single view of the chest 10/04/2014.

CLINICAL DATA: Generalized weakness and altered mental status
today.

EXAM:
PORTABLE CHEST 1 VIEW

[chest ap]
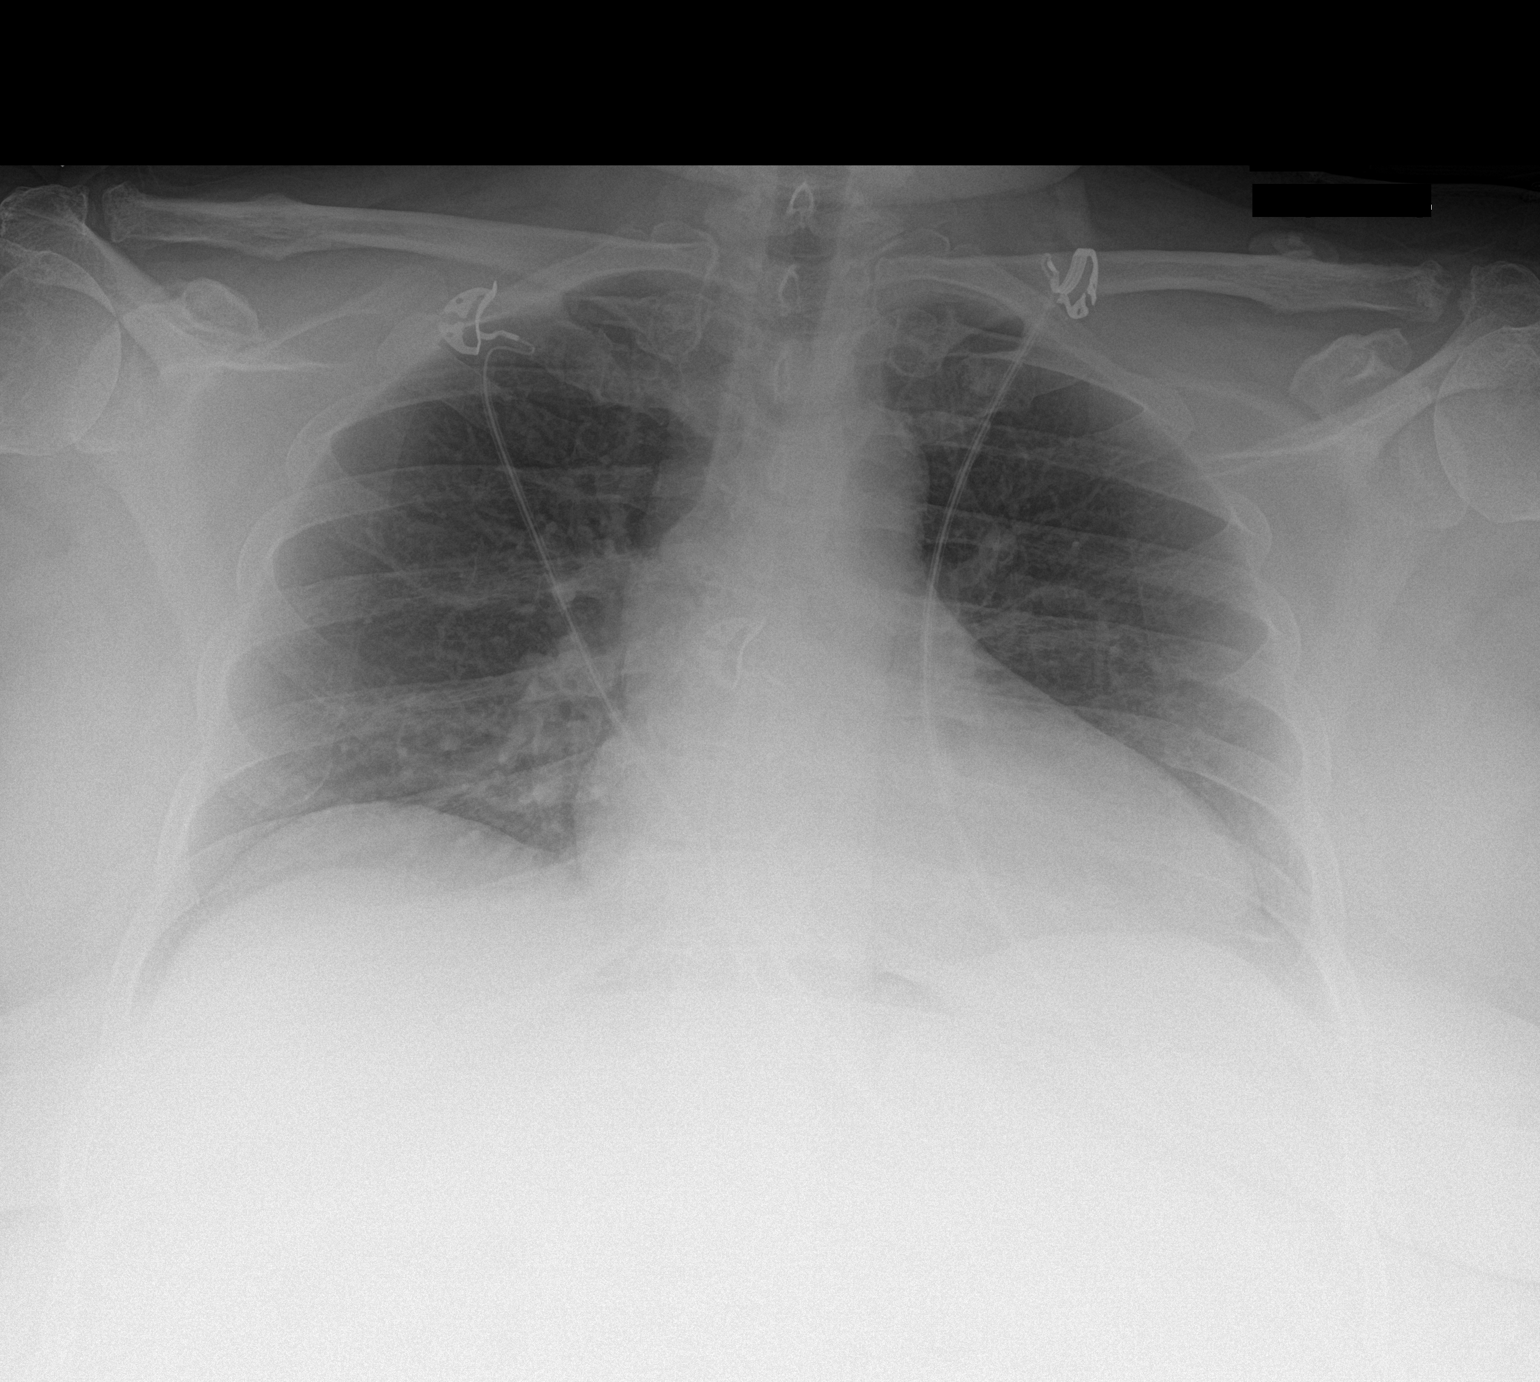

[1 of 1 positions shown; findings below may reference images not displayed]

FINDINGS: There is cardiomegaly without edema. No pneumothorax or pleural
effusion. No focal bony abnormality.
IMPRESSION: Cardiomegaly without acute disease.

## 2016-07-24 IMAGING — CT CT ABD-PELV W/ CM
2 of 5 series · 16 of 46 positions shown, 18 images · IV contrast (omnipaque)
Comparison: 01/29/2015

CLINICAL DATA: Diffuse abdominal pain and tenderness. Nausea.
Cirrhosis.

EXAM:
CT ABDOMEN AND PELVIS WITH CONTRAST
TECHNIQUE: Multidetector CT imaging of the abdomen and pelvis was performed
using the standard protocol following bolus administration of
intravenous contrast.
CONTRAST:  100mL OMNIPAQUE IOHEXOL 300 MG/ML  SOLN

[Series 2: routine abd pel with · axial · 0.93mm/px · z∈[-463,+2]mm · 13 of 105 slices shown, 15 images]
[im 6/105  soft-tissue]
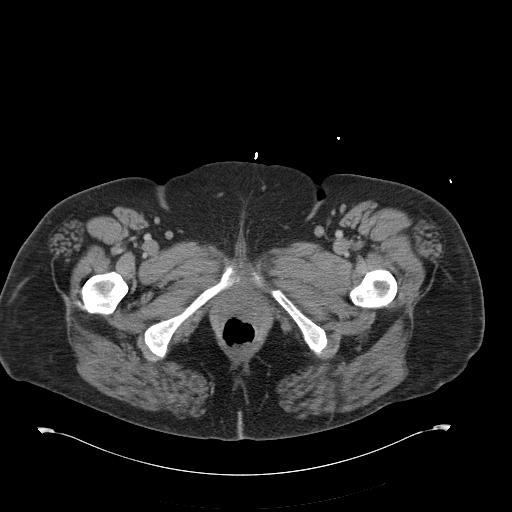
[im 6/105  bone]
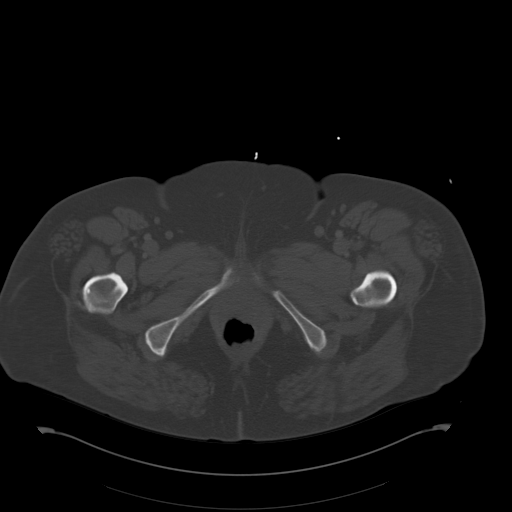
[im 17/105  soft-tissue]
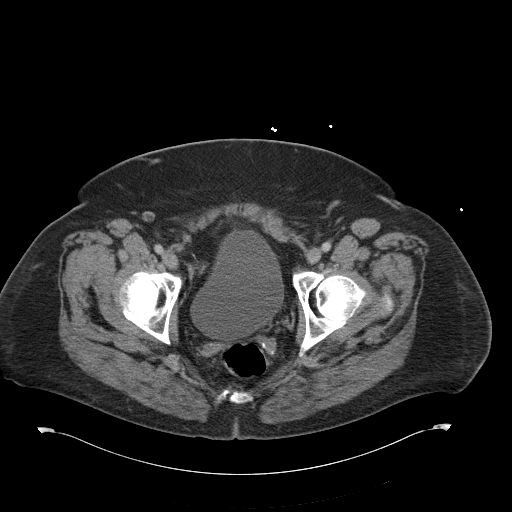
[im 22/105  soft-tissue]
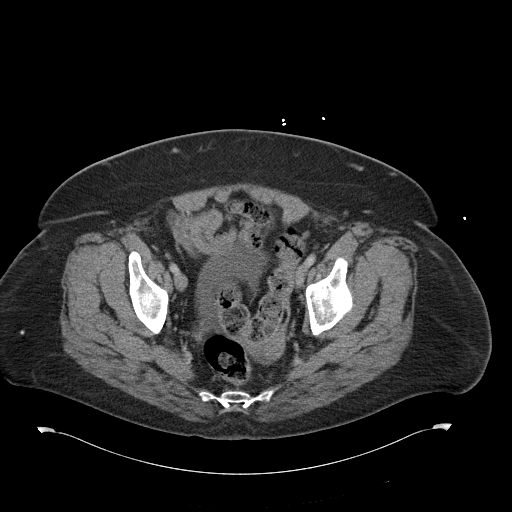
[im 28/105  soft-tissue]
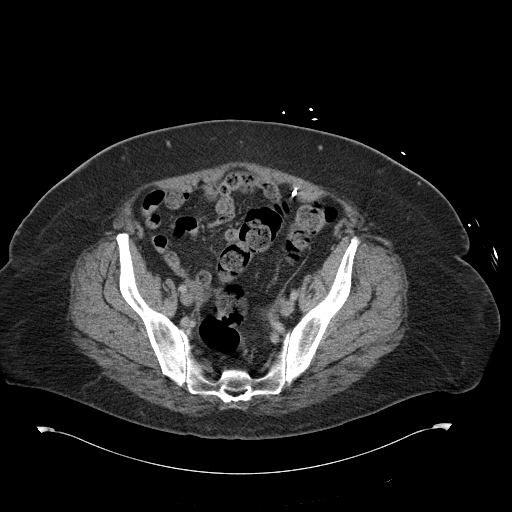
[im 39/105  soft-tissue]
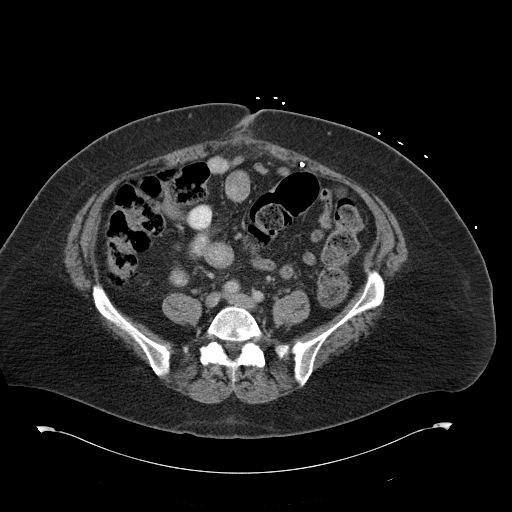
[im 44/105  soft-tissue]
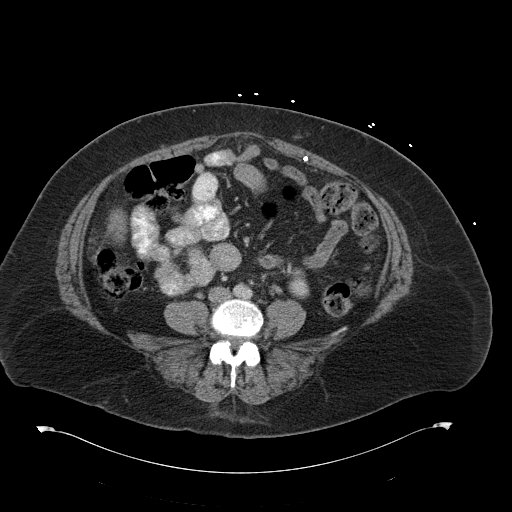
[im 55/105  soft-tissue]
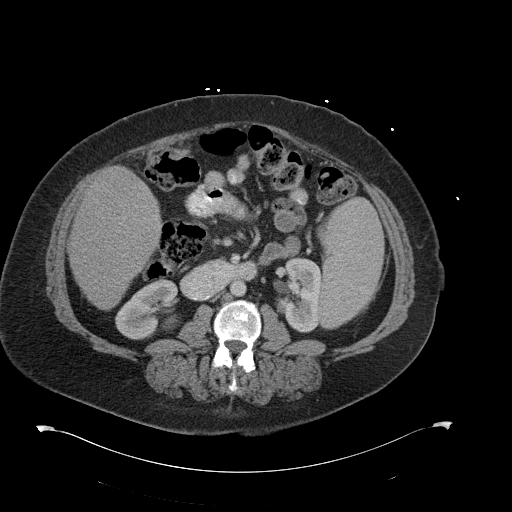
[im 61/105  soft-tissue]
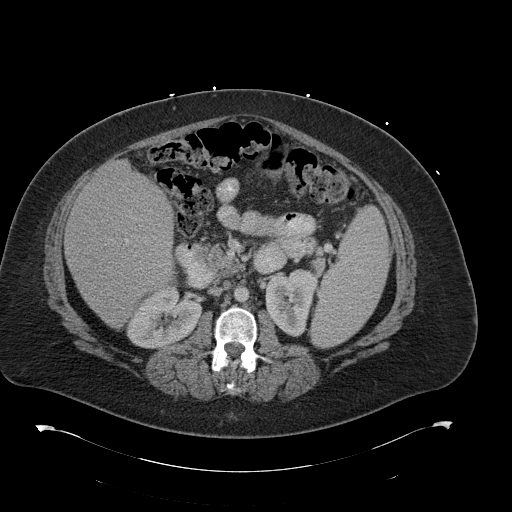
[im 66/105  soft-tissue]
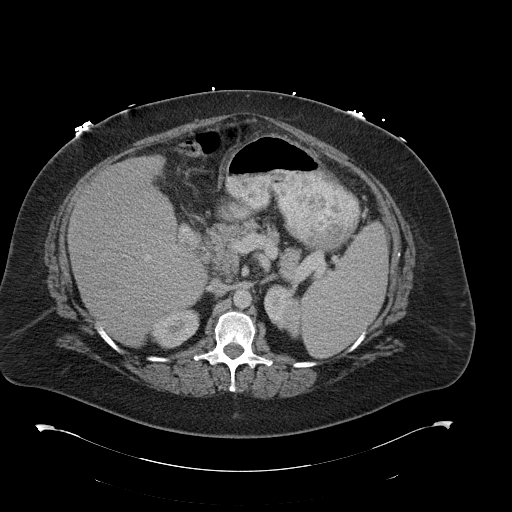
[im 66/105  bone]
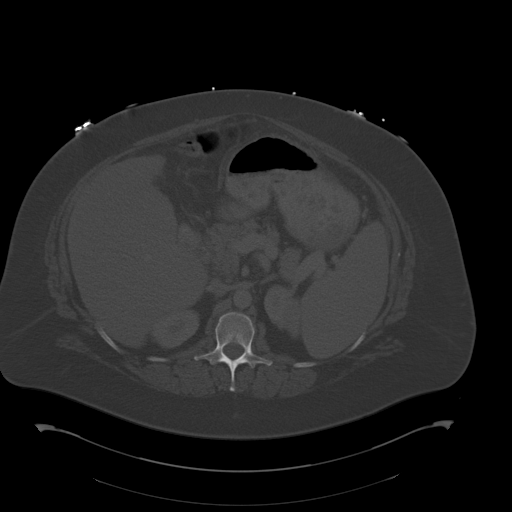
[im 77/105  soft-tissue]
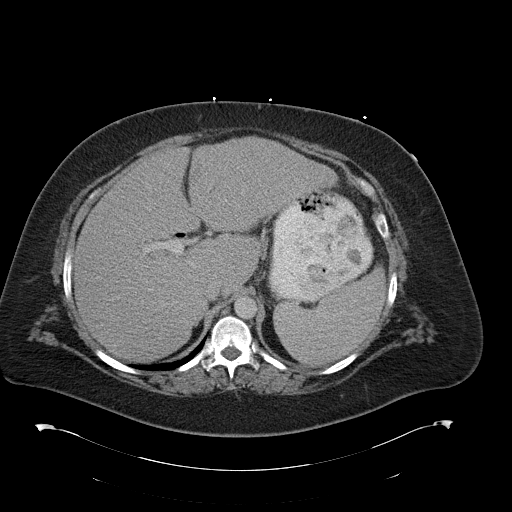
[im 83/105  soft-tissue]
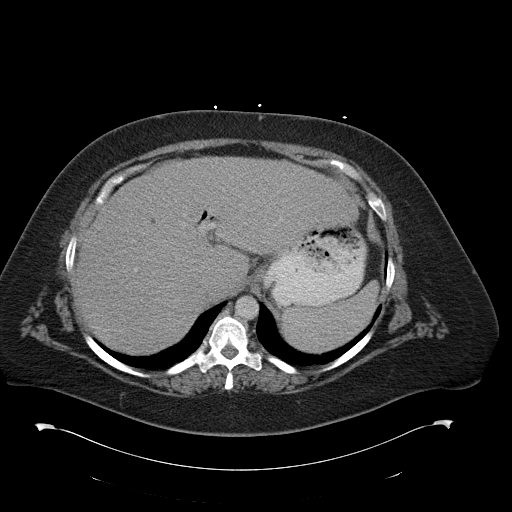
[im 88/105  soft-tissue]
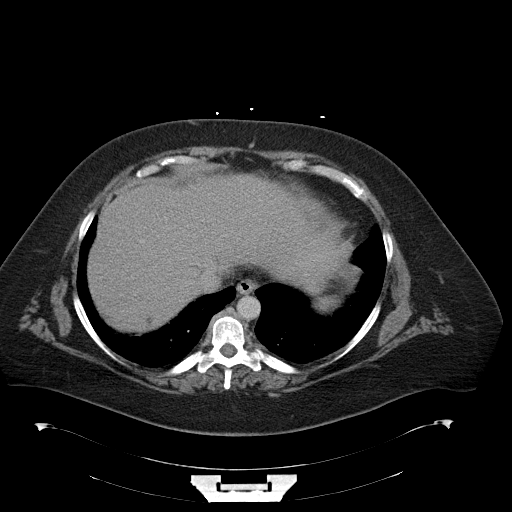
[im 99/105  soft-tissue]
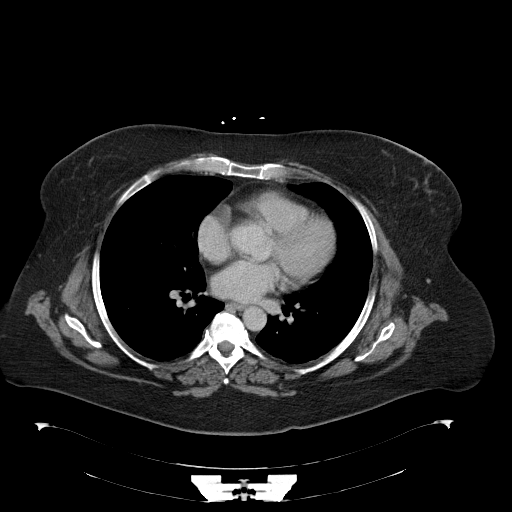

[Series 5: cor routine abd pel with · coronal · 0.81mm/px · 3 of 168 slices shown]
[im 56/168  soft-tissue]
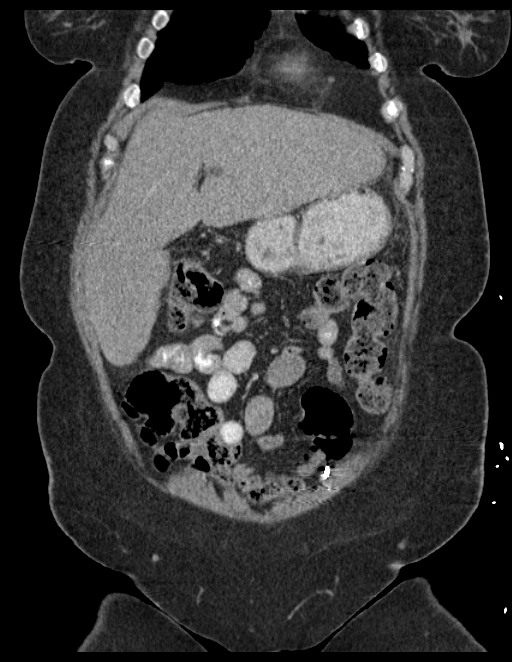
[im 75/168  soft-tissue]
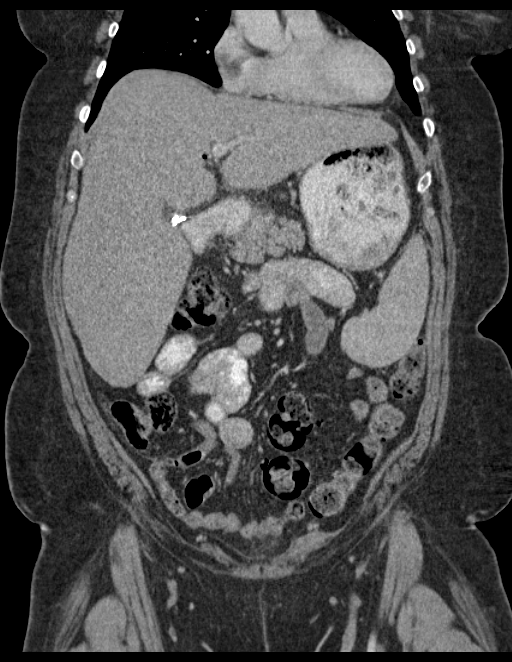
[im 93/168  soft-tissue]
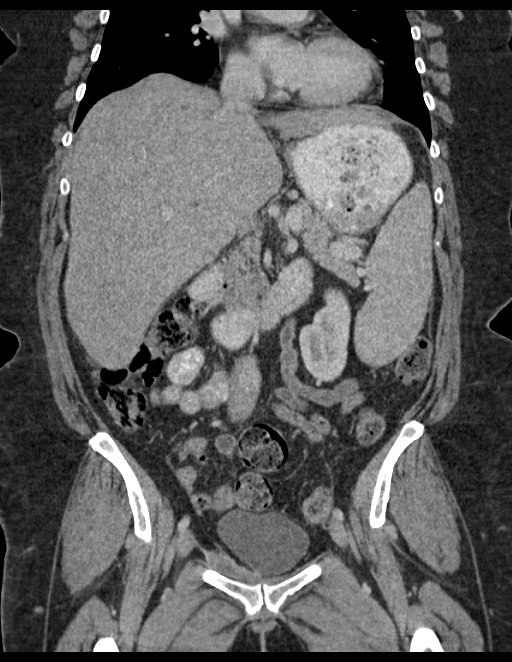

[16 of 46 positions shown; findings below may reference images not displayed]

FINDINGS: Lower chest:  No acute findings.

Hepatobiliary: Hepatic cirrhosis again demonstrated. Several tiny
left hepatic lobe cyst remains stable and no definite liver masses
are identified. Prior cholecystectomy noted. No evidence of biliary
dilatation.

Pancreas: No mass, inflammatory changes, or other significant
abnormality.

Spleen: Mild splenomegaly again seen, consistent with portal venous
hypertension.

Adrenals/Urinary Tract: No masses identified. No evidence of
hydronephrosis.

Stomach/Bowel: No evidence of obstruction, inflammatory process, or
abnormal fluid collections. Normal appendix visualized.

Vascular/Lymphatic: No pathologically enlarged lymph nodes. No
evidence of abdominal aortic aneurysm.

Reproductive: No mass or other significant abnormality.

Other: No evidence ascites. There has been further decrease in size
and near complete resolution of hematoma in the inferior right
rectus sheath near the pubic insertion.

Musculoskeletal:  No suspicious bone lesions identified.
IMPRESSION: No acute findings within the abdomen or pelvis.

Stable hepatic cirrhosis and small cysts. No radiographic evidence
of hepatic neoplasm.

Stable splenomegaly, consistent with portal venous hypertension. No
evidence of ascites.

Near complete resolution of inferior right rectus sheath hematoma
adjacent to pubic insertion.

## 2016-07-24 IMAGING — CT CT HEAD W/O CM
2 series · 16 of 30 positions shown, 20 images · non-contrast
Comparison: Head CT scan 10/10/2011.

CLINICAL DATA: Difficulty speaking today. No known injury. Initial
encounter.

EXAM:
CT HEAD WITHOUT CONTRAST
TECHNIQUE: Contiguous axial images were obtained from the base of the skull
through the vertex without intravenous contrast.

[Series 2: head wo · axial · 0.39mm/px · z∈[+624,+742]mm · 13 of 30 slices shown, 17 images]
[im 3/30  brain]
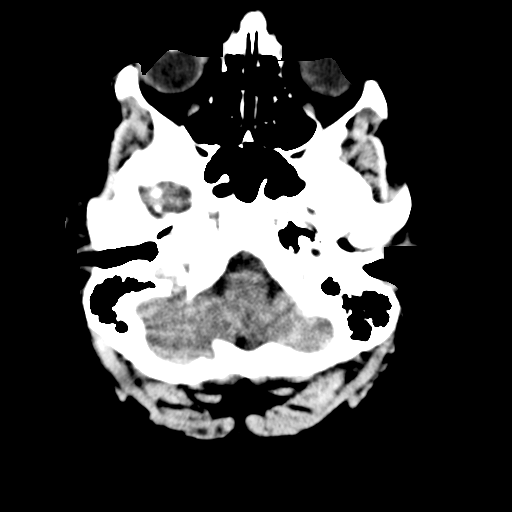
[im 3/30  bone]
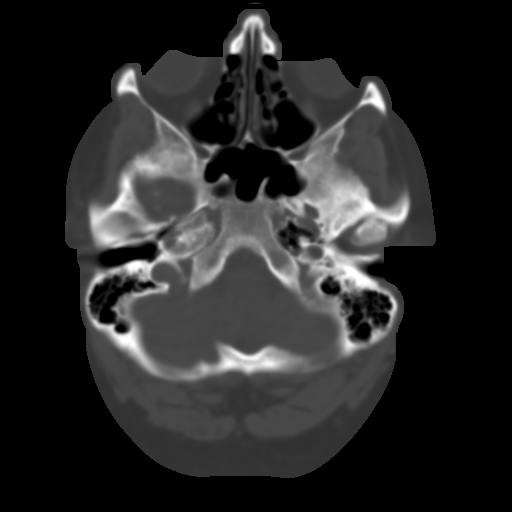
[im 5/30  brain]
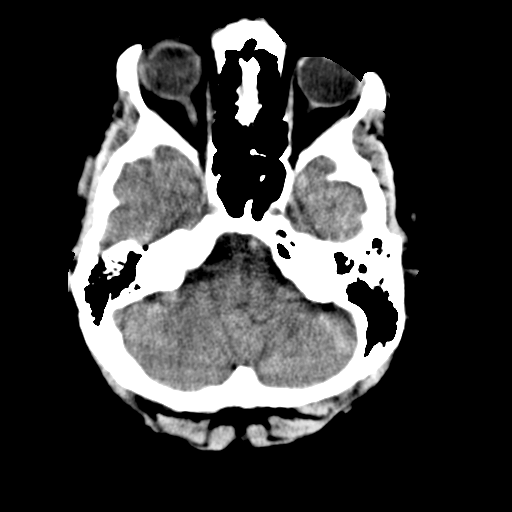
[im 7/30  brain]
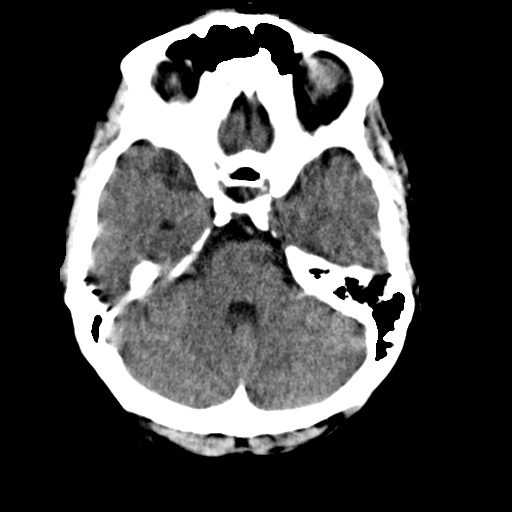
[im 9/30  brain]
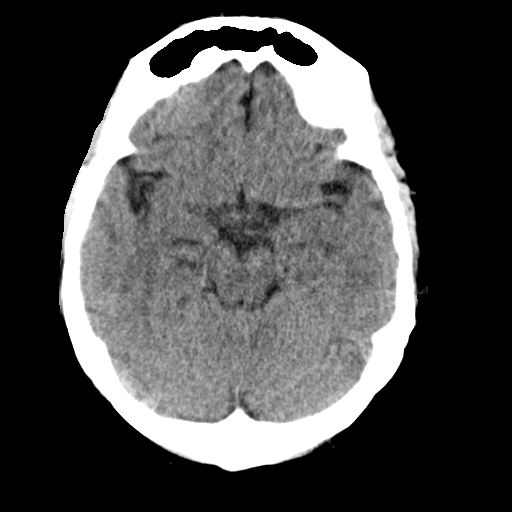
[im 11/30  brain]
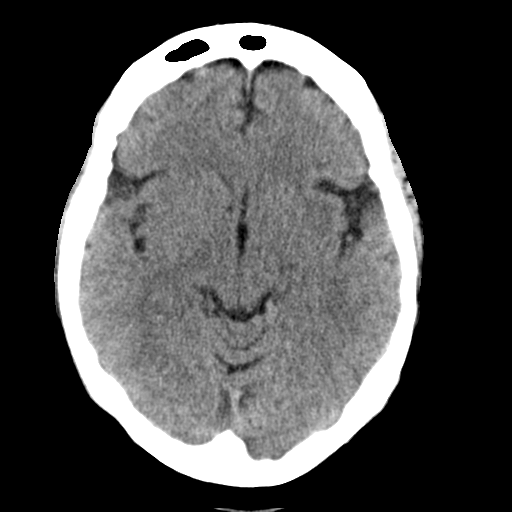
[im 11/30  bone]
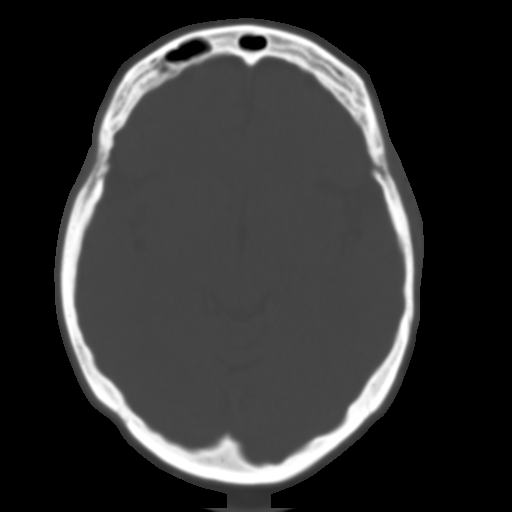
[im 13/30  brain]
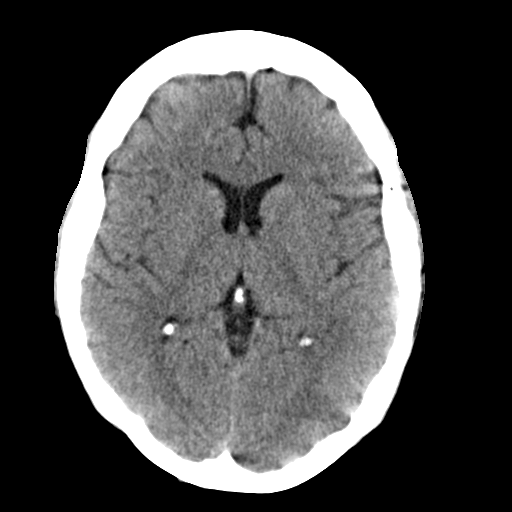
[im 15/30  brain]
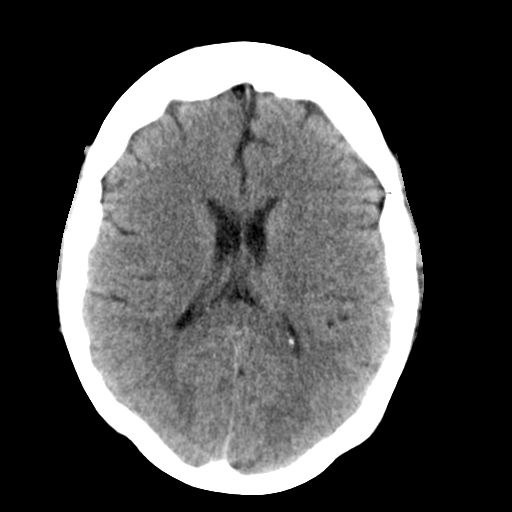
[im 17/30  brain]
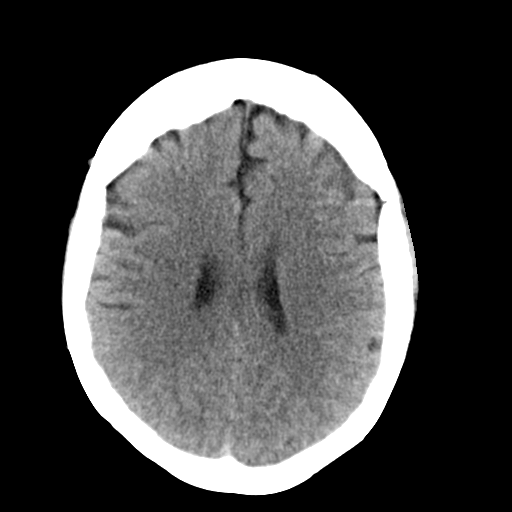
[im 19/30  brain]
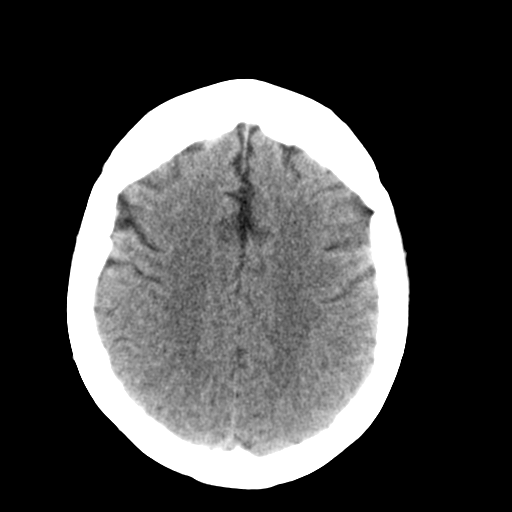
[im 19/30  bone]
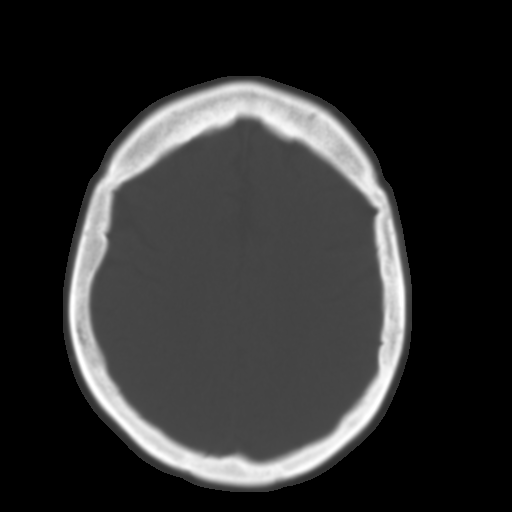
[im 21/30  brain]
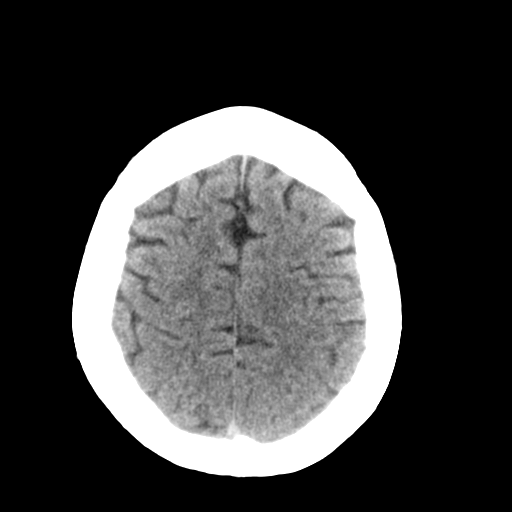
[im 23/30  brain]
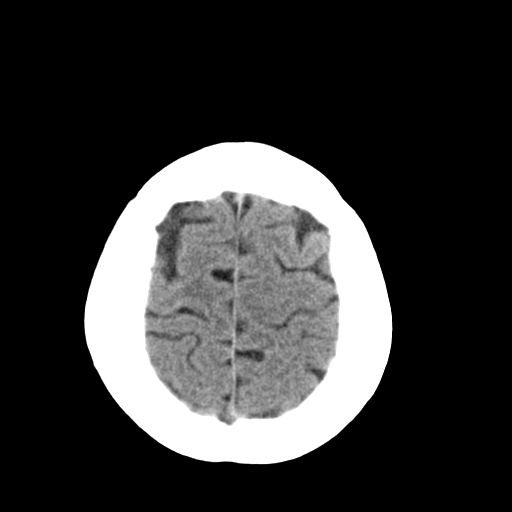
[im 25/30  brain]
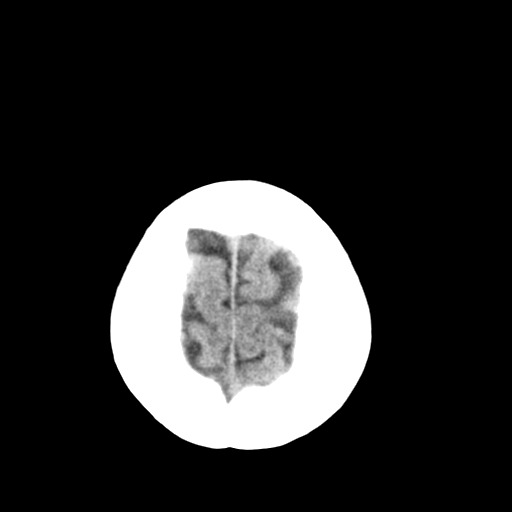
[im 27/30  brain]
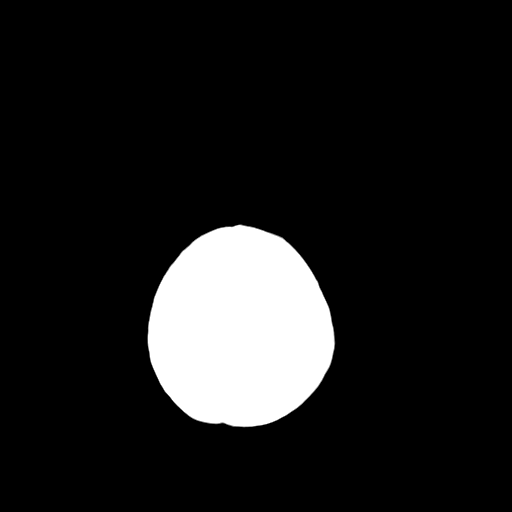
[im 27/30  bone]
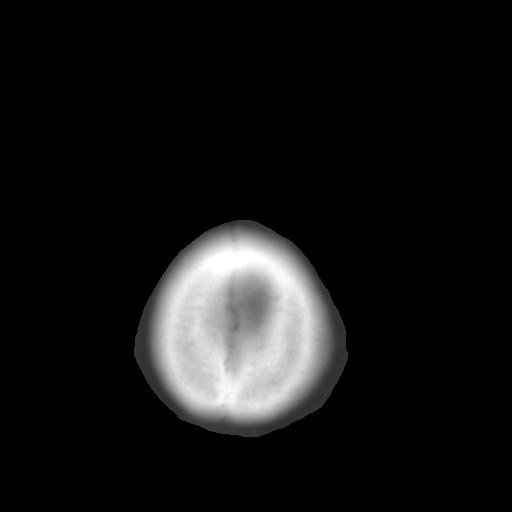

[Series 3: head bone · axial · 0.39mm/px · z∈[+625,+665]mm · 3 of 30 slices shown]
[im 3/30  bone]
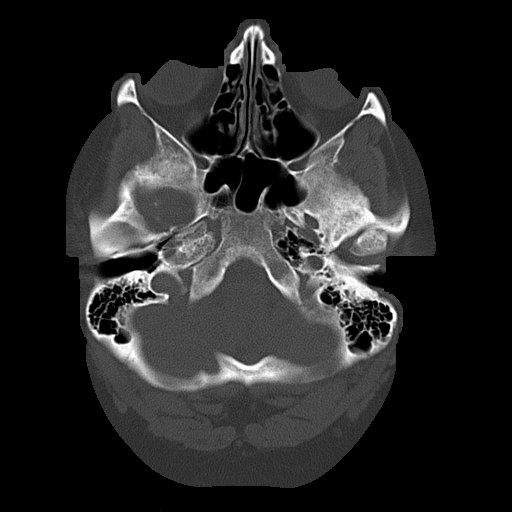
[im 7/30  bone]
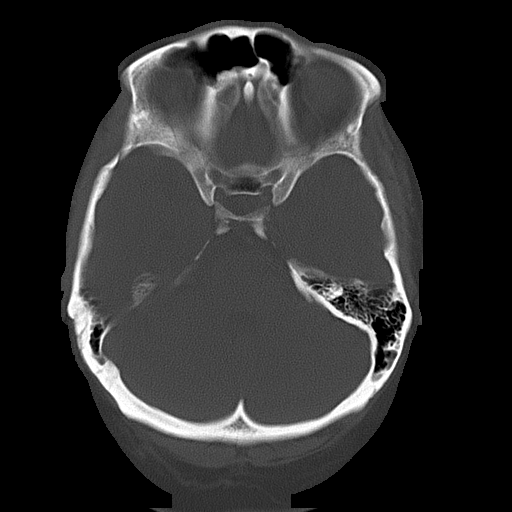
[im 11/30  bone]
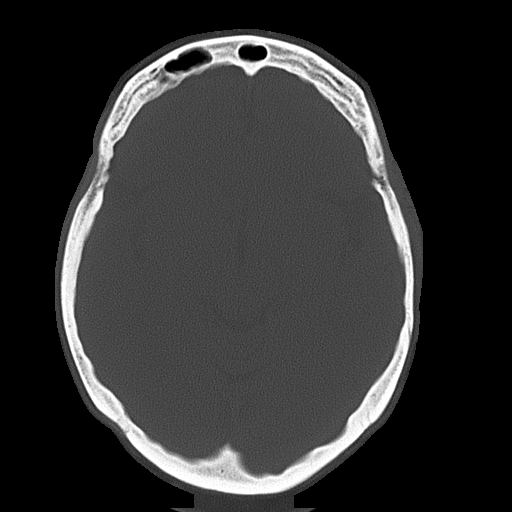

[16 of 30 positions shown; findings below may reference images not displayed]

FINDINGS: There is no evidence of acute intracranial abnormality including
hemorrhage, infarct, mass lesion, mass effect, midline shift or
abnormal extra-axial fluid collection. No hydrocephalus or
pneumocephalus. The calvarium is intact. Imaged paranasal sinuses
and mastoid air cells are clear.
IMPRESSION: Negative head CT.

## 2016-07-29 ENCOUNTER — Other Ambulatory Visit: Payer: Self-pay | Admitting: Family Medicine

## 2016-07-29 DIAGNOSIS — Z1239 Encounter for other screening for malignant neoplasm of breast: Secondary | ICD-10-CM

## 2016-08-01 ENCOUNTER — Encounter: Payer: Self-pay | Admitting: Pain Medicine

## 2016-08-01 ENCOUNTER — Ambulatory Visit: Payer: Medicaid Other | Attending: Pain Medicine | Admitting: Pain Medicine

## 2016-08-01 VITALS — BP 163/102 | HR 103 | Temp 99.1°F | Resp 16 | Ht 62.0 in | Wt 264.0 lb

## 2016-08-01 DIAGNOSIS — I129 Hypertensive chronic kidney disease with stage 1 through stage 4 chronic kidney disease, or unspecified chronic kidney disease: Secondary | ICD-10-CM | POA: Diagnosis not present

## 2016-08-01 DIAGNOSIS — M5441 Lumbago with sciatica, right side: Secondary | ICD-10-CM | POA: Insufficient documentation

## 2016-08-01 DIAGNOSIS — M4696 Unspecified inflammatory spondylopathy, lumbar region: Secondary | ICD-10-CM | POA: Insufficient documentation

## 2016-08-01 DIAGNOSIS — Z885 Allergy status to narcotic agent status: Secondary | ICD-10-CM | POA: Insufficient documentation

## 2016-08-01 DIAGNOSIS — M47816 Spondylosis without myelopathy or radiculopathy, lumbar region: Secondary | ICD-10-CM | POA: Diagnosis not present

## 2016-08-01 DIAGNOSIS — M533 Sacrococcygeal disorders, not elsewhere classified: Secondary | ICD-10-CM

## 2016-08-01 DIAGNOSIS — Z9109 Other allergy status, other than to drugs and biological substances: Secondary | ICD-10-CM | POA: Diagnosis not present

## 2016-08-01 DIAGNOSIS — G4730 Sleep apnea, unspecified: Secondary | ICD-10-CM | POA: Diagnosis not present

## 2016-08-01 DIAGNOSIS — E1122 Type 2 diabetes mellitus with diabetic chronic kidney disease: Secondary | ICD-10-CM | POA: Insufficient documentation

## 2016-08-01 DIAGNOSIS — K219 Gastro-esophageal reflux disease without esophagitis: Secondary | ICD-10-CM | POA: Diagnosis not present

## 2016-08-01 DIAGNOSIS — E039 Hypothyroidism, unspecified: Secondary | ICD-10-CM | POA: Insufficient documentation

## 2016-08-01 DIAGNOSIS — G43909 Migraine, unspecified, not intractable, without status migrainosus: Secondary | ICD-10-CM | POA: Diagnosis not present

## 2016-08-01 DIAGNOSIS — J449 Chronic obstructive pulmonary disease, unspecified: Secondary | ICD-10-CM | POA: Diagnosis not present

## 2016-08-01 DIAGNOSIS — G8929 Other chronic pain: Secondary | ICD-10-CM | POA: Diagnosis not present

## 2016-08-01 DIAGNOSIS — N189 Chronic kidney disease, unspecified: Secondary | ICD-10-CM | POA: Insufficient documentation

## 2016-08-01 DIAGNOSIS — M542 Cervicalgia: Secondary | ICD-10-CM | POA: Diagnosis not present

## 2016-08-01 DIAGNOSIS — M5442 Lumbago with sciatica, left side: Secondary | ICD-10-CM | POA: Diagnosis not present

## 2016-08-01 DIAGNOSIS — Z79891 Long term (current) use of opiate analgesic: Secondary | ICD-10-CM | POA: Insufficient documentation

## 2016-08-01 DIAGNOSIS — G894 Chronic pain syndrome: Secondary | ICD-10-CM | POA: Insufficient documentation

## 2016-08-01 DIAGNOSIS — Z8673 Personal history of transient ischemic attack (TIA), and cerebral infarction without residual deficits: Secondary | ICD-10-CM | POA: Diagnosis not present

## 2016-08-01 DIAGNOSIS — K7581 Nonalcoholic steatohepatitis (NASH): Secondary | ICD-10-CM | POA: Diagnosis not present

## 2016-08-01 DIAGNOSIS — Z836 Family history of other diseases of the respiratory system: Secondary | ICD-10-CM | POA: Insufficient documentation

## 2016-08-01 DIAGNOSIS — M79673 Pain in unspecified foot: Secondary | ICD-10-CM | POA: Diagnosis not present

## 2016-08-01 DIAGNOSIS — Z794 Long term (current) use of insulin: Secondary | ICD-10-CM | POA: Insufficient documentation

## 2016-08-01 DIAGNOSIS — Z801 Family history of malignant neoplasm of trachea, bronchus and lung: Secondary | ICD-10-CM | POA: Diagnosis not present

## 2016-08-01 NOTE — Patient Instructions (Signed)
Radiofrequency Lesioning Radiofrequency lesioning is a procedure that is performed to relieve pain. The procedure is often used for back, neck, or arm pain. Radiofrequency lesioning involves the use of a machine that creates radio waves to make heat. During the procedure, the heat is applied to the nerve that carries the pain signal. The heat damages the nerve and interferes with the pain signal. Pain relief usually starts about 2 weeks after the procedure and lasts for 6 months to 1 year. Tell a health care provider about:  Any allergies you have.  All medicines you are taking, including vitamins, herbs, eye drops, creams, and over-the-counter medicines.  Any problems you or family members have had with anesthetic medicines.  Any blood disorders you have.  Any surgeries you have had.  Any medical conditions you have.  Whether you are pregnant or may be pregnant. What are the risks? Generally, this is a safe procedure. However, problems may occur, including:  Pain or soreness at the injection site.  Infection at the injection site.  Damage to nerves or blood vessels.  What happens before the procedure?  Ask your health care provider about: ? Changing or stopping your regular medicines. This is especially important if you are taking diabetes medicines or blood thinners. ? Taking medicines such as aspirin and ibuprofen. These medicines can thin your blood. Do not take these medicines before your procedure if your health care provider instructs you not to.  Follow instructions from your health care provider about eating or drinking restrictions.  Plan to have someone take you home after the procedure.  If you go home right after the procedure, plan to have someone with you for 24 hours. What happens during the procedure?  You will be given one or more of the following: ? A medicine to help you relax (sedative). ? A medicine to numb the area (local anesthetic).  You will be  awake during the procedure. You will need to be able to talk with the health care provider during the procedure.  With the help of a type of X-ray (fluoroscopy), the health care provider will insert a radiofrequency needle into the area to be treated.  Next, a wire that carries the radio waves (electrode) will be put through the radiofrequency needle. An electrical pulse will be sent through the electrode to verify the correct nerve. You will feel a tingling sensation, and you may have muscle twitching.  Then, the tissue that is around the needle tip will be heated by an electric current that is passed using the radiofrequency machine. This will numb the nerves.  A bandage (dressing) will be put on the insertion area after the procedure is done. The procedure may vary among health care providers and hospitals. What happens after the procedure?  Your blood pressure, heart rate, breathing rate, and blood oxygen level will be monitored often until the medicines you were given have worn off.  Return to your normal activities as directed by your health care provider. This information is not intended to replace advice given to you by your health care provider. Make sure you discuss any questions you have with your health care provider. Document Released: 08/18/2010 Document Revised: 05/28/2015 Document Reviewed: 01/27/2014 [employer] Interactive Patient Education  [employer].

## 2016-08-01 NOTE — Progress Notes (Signed)
Patient's Name: Lori Mckenzie  MRN: [id number]  Referring Provider: Ardine Eng, MD  DOB: 01-02-1966  PCP: Ardine Eng, MD  DOS: 08/01/2016  Note by: Gaspar Cola, MD  Service setting: Ambulatory outpatient  Specialty: Interventional Pain Management  Location: [facility] (AMB) Pain Management Facility    Patient type: Established   Primary Reason(s) for Visit: Encounter for post-procedure evaluation of chronic illness with mild to moderate exacerbation CC: Back Pain (low bilateral) and Leg Pain (left > right)  HPI  [name] is a 51 y.o. year old, female patient, who comes today for a post-procedure evaluation. She has Type 2 diabetes mellitus ([place]); COPD (chronic obstructive pulmonary disease) (HCC); GERD (gastroesophageal reflux disease); OSA on CPAP; Anxiety; Steatohepatitis; Dysthymia; Other social stressor; Ascites; NASH (nonalcoholic steatohepatitis); Hypokalemia; Opiate use (75 MME/Day); Long term prescription opiate use; Long term current use of opiate analgesic; Encounter for therapeutic drug level monitoring; Chronic epigastric abdominal pain; Chronic low back pain (Location of Primary Source of Pain) (Bilateral) (R>L); Chronic neck pain (Location of Tertiary source of pain) (Bilateral) (R>L); Breath shortness; Diastolic dysfunction; Clinical depression; Airway hyperreactivity; Essential (primary) hypertension; 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, L1-2, & L4-5); Lumbar and sacral osteoarthritis; Myofascial pain; Drowsiness; Episode of syncope; Subacute lumbar radiculopathy (left side) (S1 dermatome); Vitamin D deficiency; B12 deficiency; Folate deficiency; Thoracic radiculitis; Elevated sedimentation rate; Elevated C-reactive protein (CRP); Lumbar facet syndrome (Location of Primary Source of Pain) (Bilateral) (R>L); Cervical spondylosis; Chronic feet pain (Location of Secondary source of pain) (Bilateral) (R>L); Lumbar spondylosis; Encounter for chronic pain management; Chronic shoulder pain  (Bilateral) (R>L); Chronic carpal tunnel syndrome (Bilateral); Chronic hip pain (Bilateral) (L>R); Chronic upper back pain (Bilateral) (L>R); Osteoporosis, idiopathic; Abnormal MRI, lumbar spine (02/03/2015); Hepatic encephalopathy ([place]); Radicular pain of thoracic region; Altered mental status; Cirrhosis of liver without ascites ([place]); Elevated liver enzymes; Opiate withdrawal ([place]); Chronic pain syndrome; Asthma; Lumbar spinal stenosis; Lumbosacral spondylosis without myelopathy; Neuromyositis; Somnolence; Spinal stenosis, lumbar region without neurogenic claudication (L1-2 and L4-5); Spondylosis of lumbar region without myelopathy or radiculopathy; Allodynia; Osteoarthritis; Chronic pain of left upper extremity; Chronic radicular cervical pain (L); Chronic sacroiliac joint pain (Bilateral) (L>R); Diabetes mellitus, insulin dependent (IDDM), uncontrolled ([place]); Chronic pain of lower extremity, bilateral; Spinal stenosis, thoracic region (T10-11); Nausea without vomiting; Cirrhosis ([place]); Constipation; Hyperglycemia; Uncontrolled diabetes mellitus ([place]); Abdominal pain; and Low back pain due to L1-2 disc extrusion (caudal) (Left) on her problem list. Her primarily concern today is the Back Pain (low bilateral) and Leg Pain (left > right)  Pain Assessment: Location: Lower Back Radiating: legs Onset: More than a month ago Duration: Chronic pain Quality: Burning, Stabbing Severity: 4 /10 (self-reported pain score)  Note: Reported level is compatible with observation.                   Timing: Constant Modifying factors: medications  [name] comes in today for post-procedure evaluation after the treatment done on 07/11/2016.  Further details on both, my assessment(s), as well as the proposed treatment plan, please see below.  Post-Procedure Assessment  07/11/2016 Procedure: Diagnostic bilateral lumbar facet block under fluoroscopic guidance and IV sedation Pre-procedure pain score:   4/10 Post-procedure pain score: 0/10 (100% relief) Influential Factors: BMI: 48.29 kg/m Intra-procedural challenges: None observed.         Assessment challenges: None detected.              Reported side-effects: None.        Post-procedural adverse reactions or complications: None  reported         Sedation: Sedation provided. When no sedatives are used, the analgesic levels obtained are directly associated to the effectiveness of the local anesthetics. However, when sedation is provided, the level of analgesia obtained during the initial 1 hour following the intervention, is believed to be the result of a combination of factors. These factors may include, but are not limited to: 1. The effectiveness of the local anesthetics used. 2. The effects of the analgesic(s) and/or anxiolytic(s) used. 3. The degree of discomfort experienced by the patient at the time of the procedure. 4. The patients ability and reliability in recalling and recording the events. 5. The presence and influence of possible secondary gains and/or psychosocial factors. Reported result: Relief experienced during the 1st hour after the procedure: 90 % (Ultra-Short Term Relief) Interpretative annotation: Clinically appropriate result. Analgesia during this period is likely to be Local Anesthetic and/or IV Sedative (Analgesic/Anxiolytic) related.          Effects of local anesthetic: The analgesic effects attained during this period are directly associated to the localized infiltration of local anesthetics and therefore cary significant diagnostic value as to the etiological location, or anatomical origin, of the pain. Expected duration of relief is directly dependent on the pharmacodynamics of the local anesthetic used. Long-acting (4-6 hours) anesthetics used.  Reported result: Relief during the next 4 to 6 hour after the procedure: 90 % (Short-Term Relief) Interpretative annotation: Clinically appropriate result. Analgesia  during this period is likely to be Local Anesthetic-related.          Long-term benefit: Defined as the period of time past the expected duration of local anesthetics (1 hour for short-acting and 4-6 hours for long-acting). With the possible exception of prolonged sympathetic blockade from the local anesthetics, benefits during this period are typically attributed to, or associated with, other factors such as analgesic sensory neuropraxia, antiinflammatory effects, or beneficial biochemical changes provided by agents other than the local anesthetics.  Reported result: Extended relief following procedure: 70 % (for two weeks) (Long-Term Relief) Interpretative annotation: Clinically appropriate result. Good relief. No permanent benefit expected. Inflammation plays a part in the etiology to the pain.          Current benefits: Defined as persistent relief that continues at this point in time.   Reported results: Treated area: <50 % [name] reports improvement in function Interpretative annotation: Recurrence of symptoms. No permanent benefit expected. Effective diagnostic intervention.          Interpretation: Results would suggest a successful diagnostic intervention.          Laboratory Chemistry  Inflammation Markers (CRP: Acute Phase) (ESR: Chronic Phase) Lab Results  Component Value Date   CRP <0.8 04/12/2016   ESRSEDRATE 4 04/12/2016                 Renal Function Markers Lab Results  Component Value Date   BUN 18 07/12/2016   CREATININE 0.77 07/12/2016   GFRAA >60 07/12/2016   GFRNONAA >60 07/12/2016                 Hepatic Function Markers Lab Results  Component Value Date   AST 94 (H) 06/28/2016   ALT 105 (H) 06/28/2016   ALBUMIN 4.1 06/28/2016   ALKPHOS 102 06/28/2016                 Electrolytes Lab Results  Component Value Date   NA 136 07/12/2016   K 4.3 07/12/2016  CL 99 (L) 07/12/2016   CALCIUM 10.6 (H) 07/12/2016   MG 1.9 01/08/2015                  Neuropathy Markers Lab Results  Component Value Date   VITAMINB12 272 04/12/2016                 Bone Pathology Markers Lab Results  Component Value Date   ALKPHOS 102 06/28/2016   VD25OH 14.3 (L) 01/08/2015   VD125OH2TOT 28.8 01/08/2015   25OHVITD1 36 04/12/2016   25OHVITD2 3.9 04/12/2016   25OHVITD3 32 04/12/2016   CALCIUM 10.6 (H) 07/12/2016                 Coagulation Parameters Lab Results  Component Value Date   INR 1.18 12/01/2015   LABPROT 15.1 12/01/2015   APTT 24.6 08/26/2013   PLT 139 (L) 07/12/2016                 Cardiovascular Markers Lab Results  Component Value Date   BNP 11 12/11/2012   HGB 16.5 (H) 07/12/2016   HCT 47.4 (H) 07/12/2016                 Note: Lab results reviewed.  Recent Diagnostic Imaging Review  Dg Chest 2 View  Result Date: 07/12/2016 CLINICAL DATA:  Short of breath, chest pain today EXAM: CHEST  2 VIEW COMPARISON:  Chest x-ray of 06/24/2015 FINDINGS: Somewhat prominent interstitial markings may well be chronic in nature. No definite active process is seen. No pneumonia or effusion is noted. Mediastinal and hilar contours are unremarkable and cardiomegaly is stable. No acute bony abnormality is noted. IMPRESSION: Probable mild chronic interstitial change. No definite active process. Stable mild cardiomegaly Electronically Signed   By: Ivar Drape M.D.   On: 07/12/2016 15:17   Note: Imaging results reviewed.          Meds   Current Meds  Medication Sig  . ALPRAZolam (XANAX) 1 MG tablet Take 1 mg by mouth 3 (three) times daily.   . Cholecalciferol (VITAMIN D-1000 MAX ST) 1000 units tablet Take 2,000 Units by mouth daily.   . citalopram (CELEXA) 20 MG tablet Take 30 mg by mouth daily.   . diclofenac sodium (VOLTAREN) 1 % GEL Apply 4 g topically 4 (four) times daily.  Marland Kitchen dicyclomine (BENTYL) 10 MG capsule Take 10 mg by mouth 4 (four) times daily -  before meals and at bedtime.   . fluticasone (FLONASE) 50 MCG/ACT nasal spray  Place 1-2 sprays into both nostrils daily.   Derald Macleod Factor (INTRINSI B12-FOLATE) 443-154-00 MCG-MCG-MG TABS Take 1 tablet by mouth daily. Reported on 04/06/2015  . furosemide (LASIX) 40 MG tablet Take 40 mg by mouth daily.   Marland Kitchen gabapentin (NEURONTIN) 300 MG capsule Take 600 mg by mouth 4 (four) times daily as needed (for pain).   . hydrochlorothiazide (MICROZIDE) 12.5 MG capsule Take 12.5 mg by mouth as needed.  . insulin aspart (NOVOLOG) 100 UNIT/ML injection 30 units with meals 3 times daily plus sliding scale.  . Insulin Detemir (LEVEMIR FLEXPEN [place]) Inject 50 Units into the skin 2 (two) times daily.   Marland Kitchen ipratropium-albuterol (DUONEB) 0.5-2.5 (3) MG/3ML SOLN Take 3 mLs by nebulization every 4 (four) hours as needed.  . lactulose (CHRONULAC) 10 GM/15ML solution Take 30 g by mouth daily.   . naloxone [facility]) 2 MG/2ML injection Inject into the vein as needed.  . nystatin (MYCOSTATIN) powder Apply 1 g topically 3 (  three) times daily as needed (for irritation).   . potassium chloride SA (K-DUR,KLOR-CON) 20 MEQ tablet Take 10-20 mEq by mouth daily.   . promethazine (PHENERGAN) 12.5 MG tablet Take 12.5 mg by mouth every 6 (six) hours as needed for nausea or vomiting.   . rifaximin (XIFAXAN) 550 MG TABS tablet Take 550 mg by mouth 2 (two) times daily.   . rizatriptan (MAXALT) 10 MG tablet Take 10 mg by mouth as needed for migraine.   Marland Kitchen spironolactone (ALDACTONE) 100 MG tablet Take 100 mg by mouth daily.   . vitamin B-12 (CYANOCOBALAMIN) 1000 MCG tablet Take 2,000 mcg by mouth daily.    ROS  Constitutional: Denies any fever or chills Gastrointestinal: No reported hemesis, hematochezia, vomiting, or acute GI distress Musculoskeletal: Denies any acute onset joint swelling, redness, loss of ROM, or weakness Neurological: No reported episodes of acute onset apraxia, aphasia, dysarthria, agnosia, amnesia, paralysis, loss of coordination, or loss of consciousness  Allergies  [name] is  allergic to tape and vicodin [hydrocodone-acetaminophen].  PFSH  Drug: [name]  reports that she does not use drugs. Alcohol:  reports that she does not drink alcohol. Tobacco:  reports that she has never smoked. She has never used smokeless tobacco. Medical:  has a past medical history of Abdominal abscess (08/25/2014); Acid reflux (08/10/2010); Acute cervical myofascial strain (02/09/2016); Anxiety; Ascites; Asthma; Back pain; Bile leak, postoperative (07/17/2014); Brittle bone disease; Cancer ([place]); Cervical disc disease; Chronic kidney disease; Collagen vascular disease ([place]); COPD (chronic obstructive pulmonary disease) ([place]); Diabetes mellitus without complication ([place]); GERD (gastroesophageal reflux disease); Hypertension; Hypothyroidism; Left upper quadrant pain (01/09/2014); Major depressive disorder with single episode (12/05/2011); Major depressive disorder, single episode (12/05/2011); Migraines; NASH (nonalcoholic steatohepatitis); Respiratory infection; Shock ([place]) (09/18/2014); Sleep apnea; Sleep apnea; Syncope (11/16/2014); Thyroid disease; and TIA (transient ischemic attack). Surgical: [name]  has a past surgical history that includes Abdominal hysterectomy; Tubal ligation; Colonoscopy with propofol (N/A, 06/23/2014); Esophagogastroduodenoscopy (N/A, 06/23/2014); Cholecystectomy (N/A, 07/15/2014); ERCP (N/A, 07/16/2014); Wisdom tooth extraction; ERCP (N/A, 10/03/2014); Esophagogastroduodenoscopy (egd) with propofol (N/A, 12/01/2015); and Esophagogastroduodenoscopy (N/A, 06/30/2016). Family: family history includes Emphysema in her father; Heart disease in her brother and sister; Lung cancer in her mother; Ulcers in her father and sister.  Constitutional Exam  General appearance: Well nourished, well developed, and well hydrated. In no apparent acute distress Vitals:   08/01/16 1327  BP: (!) 163/102  Pulse: (!) 103  Resp: 16  Temp: 99.1 F (37.3 C)  TempSrc: Oral  SpO2: 97%  Weight: 264 lb  (119.7 kg)  Height: 5' 2"  (1.575 m)   BMI Assessment: Estimated body mass index is 48.29 kg/m as calculated from the following:   Height as of this encounter: 5' 2"  (1.575 m).   Weight as of this encounter: 264 lb (119.7 kg).  BMI interpretation table: BMI level Category Range association with higher incidence of chronic pain  <18 kg/m2 Underweight   18.5-24.9 kg/m2 Ideal body weight   25-29.9 kg/m2 Overweight Increased incidence by 20%  30-34.9 kg/m2 Obese (Class I) Increased incidence by 68%  35-39.9 kg/m2 Severe obesity (Class II) Increased incidence by 136%  >40 kg/m2 Extreme obesity (Class III) Increased incidence by 254%   BMI Readings from Last 4 Encounters:  08/01/16 48.29 kg/m  07/12/16 47.55 kg/m  07/11/16 47.55 kg/m  06/30/16 47.55 kg/m   Wt Readings from Last 4 Encounters:  08/01/16 264 lb (119.7 kg)  07/12/16 260 lb (117.9 kg)  07/11/16 260 lb (117.9 kg)  06/30/16 260 lb (117.9 kg)  Psych/Mental status: Alert, oriented x 3 (person, place, & time)       Eyes: PERLA Respiratory: No evidence of acute respiratory distress  Cervical Spine Exam  Inspection: No masses, redness, or swelling Alignment: Symmetrical Functional ROM: Unrestricted ROM      Stability: No instability detected Muscle strength & Tone: Functionally intact Sensory: Unimpaired Palpation: No palpable anomalies              Upper Extremity (UE) Exam    Side: Right upper extremity  Side: Left upper extremity  Inspection: No masses, redness, swelling, or asymmetry. No contractures  Inspection: No masses, redness, swelling, or asymmetry. No contractures  Functional ROM: Unrestricted ROM          Functional ROM: Unrestricted ROM          Muscle strength & Tone: Functionally intact  Muscle strength & Tone: Functionally intact  Sensory: Unimpaired  Sensory: Unimpaired  Palpation: No palpable anomalies              Palpation: No palpable anomalies              Specialized Test(s): Deferred          Specialized Test(s): Deferred          Thoracic Spine Exam  Inspection: No masses, redness, or swelling Alignment: Symmetrical Functional ROM: Unrestricted ROM Stability: No instability detected Sensory: Unimpaired Muscle strength & Tone: No palpable anomalies  Lumbar Spine Exam  Inspection: No masses, redness, or swelling Alignment: Symmetrical Functional ROM: Improved after treatment      Stability: No instability detected Muscle strength & Tone: Functionally intact Sensory: Movement-associated pain Palpation: Complains of area being tender to palpation       Provocative Tests: Lumbar Hyperextension and rotation test: Positive bilaterally for facet joint pain. Lumbar Lateral bending test: evaluation deferred today       Patrick's Maneuver: evaluation deferred today                    Gait & Posture Assessment  Ambulation: Unassisted Gait: Relatively normal for age and body habitus Posture: WNL   Lower Extremity Exam    Side: Right lower extremity  Side: Left lower extremity  Inspection: No masses, redness, swelling, or asymmetry. No contractures  Inspection: No masses, redness, swelling, or asymmetry. No contractures  Functional ROM: Unrestricted ROM          Functional ROM: Unrestricted ROM          Muscle strength & Tone: Functionally intact  Muscle strength & Tone: Functionally intact  Sensory: Unimpaired  Sensory: Unimpaired  Palpation: No palpable anomalies  Palpation: No palpable anomalies   Assessment  Primary Diagnosis & Pertinent Problem List: The primary encounter diagnosis was Lumbar facet syndrome (Location of Primary Source of Pain) (Bilateral) (R>L). Diagnoses of Chronic sacroiliac joint pain (Bilateral) (L>R), Lumbar spondylosis, Chronic low back pain (Location of Primary Source of Pain) (Bilateral) (R>L), Chronic foot pain, unspecified laterality, and Chronic neck pain (Location of Tertiary source of pain) (Bilateral) (R>L) were also pertinent to this  visit.  Status Diagnosis  Improved Controlled Controlled 1. Lumbar facet syndrome (Location of Primary Source of Pain) (Bilateral) (R>L)   2. Chronic sacroiliac joint pain (Bilateral) (L>R)   3. Lumbar spondylosis   4. Chronic low back pain (Location of Primary Source of Pain) (Bilateral) (R>L)   5. Chronic foot pain, unspecified laterality   6. Chronic neck pain (Location of Tertiary source  of pain) (Bilateral) (R>L)     Problems updated and reviewed during this visit: Problem  Subacute lumbar radiculopathy (left side) (S1 dermatome)  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, L1-2, & L4-5)  Lumbar and Sacral Osteoarthritis  Myofascial Pain  Lumbar Spinal Stenosis  Lumbosacral Spondylosis Without Myelopathy  Neuromyositis  Spondylosis of Lumbar Region Without Myelopathy Or Radiculopathy   Plan of Care  Pharmacotherapy (Medications Ordered): No orders of the defined types were placed in this encounter.  New Prescriptions   No medications on file   Medications administered today: [name] had no medications administered during this visit.  Lab-work, procedure(s), and/or referral(s): No orders of the defined types were placed in this encounter.   Interventional management options: Planned, scheduled, and/or pending:   Not at this time.   Considering:   Diagnostic bilateral cervical facet block Diagnostic L1-2 lumbar epidural steroid injection Diagnostic, right-sided L4-5 lumbar epidural steroid injection Diagnostic bilateral intra-articular shoulder joint injection Diagnostic bilateral suprascapular nerve block Possible bilateral suprascapular nerve radiofrequency ablation Palliative left-sided L5-S1 lumbar epidural steroid injection  Diagnostic left S1 selective nerve root block Diagnostic right-sided cervical epidural steroid injection Diagnostic T10-11 thoracic epidural steroid injection Diagnostic bilateral intra-articular hip joint injection Possible bilateral hip  radiofrequency ablation   Palliative PRN treatment(s):   Diagnostic L1-2 lumbar epidural steroid injection Diagnostic, right-sided L4-5 lumbar epidural steroid injection Diagnostic T10-11 thoracic epidural steroid injection    Provider-requested follow-up: Return for Radiofrequency procedure under fluoro and sedation.  Future Appointments Date Time Provider [place]  08/09/2016 10:15 AM Milinda Pointer, MD [facility] None   Primary Care Physician: Ardine Eng, MD Location: [facility] Note by: Gaspar Cola, MD Date: 08/01/2016; Time: 2:58 PM  Patient Instructions  Radiofrequency Lesioning Radiofrequency lesioning is a procedure that is performed to relieve pain. The procedure is often used for back, neck, or arm pain. Radiofrequency lesioning involves the use of a machine that creates radio waves to make heat. During the procedure, the heat is applied to the nerve that carries the pain signal. The heat damages the nerve and interferes with the pain signal. Pain relief usually starts about 2 weeks after the procedure and lasts for 6 months to 1 year. Tell a health care provider about:  Any allergies you have.  All medicines you are taking, including vitamins, herbs, eye drops, creams, and over-the-counter medicines.  Any problems you or family members have had with anesthetic medicines.  Any blood disorders you have.  Any surgeries you have had.  Any medical conditions you have.  Whether you are pregnant or may be pregnant. What are the risks? Generally, this is a safe procedure. However, problems may occur, including:  Pain or soreness at the injection site.  Infection at the injection site.  Damage to nerves or blood vessels.  What happens before the procedure?  Ask your health care provider about: ? Changing or stopping your regular medicines. This is especially important if you are taking diabetes medicines or blood  thinners. ? Taking medicines such as aspirin and ibuprofen. These medicines can thin your blood. Do not take these medicines before your procedure if your health care provider instructs you not to.  Follow instructions from your health care provider about eating or drinking restrictions.  Plan to have someone take you home after the procedure.  If you go home right after the procedure, plan to have someone with you for 24 hours. What happens during the procedure?  You will be  given one or more of the following: ? A medicine to help you relax (sedative). ? A medicine to numb the area (local anesthetic).  You will be awake during the procedure. You will need to be able to talk with the health care provider during the procedure.  With the help of a type of X-ray (fluoroscopy), the health care provider will insert a radiofrequency needle into the area to be treated.  Next, a wire that carries the radio waves (electrode) will be put through the radiofrequency needle. An electrical pulse will be sent through the electrode to verify the correct nerve. You will feel a tingling sensation, and you may have muscle twitching.  Then, the tissue that is around the needle tip will be heated by an electric current that is passed using the radiofrequency machine. This will numb the nerves.  A bandage (dressing) will be put on the insertion area after the procedure is done. The procedure may vary among health care providers and hospitals. What happens after the procedure?  Your blood pressure, heart rate, breathing rate, and blood oxygen level will be monitored often until the medicines you were given have worn off.  Return to your normal activities as directed by your health care provider. This information is not intended to replace advice given to you by your health care provider. Make sure you discuss any questions you have with your health care provider. Document Released: 08/18/2010 Document  Revised: 05/28/2015 Document Reviewed: 01/27/2014 Elsevier Interactive Patient Education  [employer].

## 2016-08-01 NOTE — Progress Notes (Signed)
Safety precautions to be maintained throughout the outpatient stay will include: orient to surroundings, keep bed in low position, maintain call bell within reach at all times, provide assistance with transfer out of bed and ambulation.  

## 2016-08-02 ENCOUNTER — Ambulatory Visit: Payer: Medicaid Other | Admitting: Pain Medicine

## 2016-08-09 ENCOUNTER — Ambulatory Visit
Admission: RE | Admit: 2016-08-09 | Discharge: 2016-08-09 | Disposition: A | Discharge status: Home / Self Care | Payer: Medicaid Other | Source: Ambulatory Visit | Attending: Pain Medicine | Admitting: Pain Medicine

## 2016-08-09 ENCOUNTER — Ambulatory Visit (HOSPITAL_BASED_OUTPATIENT_CLINIC_OR_DEPARTMENT_OTHER): Payer: Medicaid Other | Admitting: Pain Medicine

## 2016-08-09 ENCOUNTER — Encounter: Payer: Self-pay | Admitting: Pain Medicine

## 2016-08-09 VITALS — BP 131/85 | HR 93 | Temp 97.2°F | Resp 25 | Ht 62.0 in | Wt 260.0 lb

## 2016-08-09 DIAGNOSIS — M47816 Spondylosis without myelopathy or radiculopathy, lumbar region: Secondary | ICD-10-CM | POA: Diagnosis not present

## 2016-08-09 DIAGNOSIS — M5442 Lumbago with sciatica, left side: Secondary | ICD-10-CM | POA: Diagnosis not present

## 2016-08-09 DIAGNOSIS — G8918 Other acute postprocedural pain: Secondary | ICD-10-CM | POA: Diagnosis not present

## 2016-08-09 DIAGNOSIS — M4696 Unspecified inflammatory spondylopathy, lumbar region: Secondary | ICD-10-CM | POA: Insufficient documentation

## 2016-08-09 DIAGNOSIS — M5441 Lumbago with sciatica, right side: Secondary | ICD-10-CM | POA: Diagnosis not present

## 2016-08-09 DIAGNOSIS — G8929 Other chronic pain: Secondary | ICD-10-CM

## 2016-08-09 HISTORY — DX: Other acute postprocedural pain: G89.18

## 2016-08-09 MED ORDER — FENTANYL CITRATE (PF) 100 MCG/2ML IJ SOLN
25.0000 ug | INTRAMUSCULAR | Status: DC | PRN
  Administered 2016-08-09: 100 ug via INTRAVENOUS
  Filled 2016-08-09: qty 2

## 2016-08-09 MED ORDER — OXYCODONE-ACETAMINOPHEN 5-325 MG PO TABS: 1.0000 | ORAL_TABLET | Freq: Four times a day (QID) | ORAL | 0 refills | Status: DC | PRN

## 2016-08-09 MED ORDER — LIDOCAINE HCL (PF) 1.5 % IJ SOLN
20.0000 mL | Freq: Once | INTRAMUSCULAR | Status: DC
  Filled 2016-08-09: qty 20

## 2016-08-09 MED ORDER — LACTATED RINGERS IV SOLN: 1000.0000 mL | Freq: Once | INTRAVENOUS | Status: DC

## 2016-08-09 MED ORDER — MIDAZOLAM HCL 5 MG/5ML IJ SOLN
1.0000 mg | INTRAMUSCULAR | Status: DC | PRN
  Administered 2016-08-09: 3 mg via INTRAVENOUS
  Filled 2016-08-09: qty 5

## 2016-08-09 MED ORDER — ROPIVACAINE HCL 2 MG/ML IJ SOLN
10.0000 mL | Freq: Once | INTRAMUSCULAR | Status: AC
  Administered 2016-08-09: 10 mL via PERINEURAL
  Filled 2016-08-09: qty 10

## 2016-08-09 MED ORDER — LIDOCAINE HCL (PF) 1 % IJ SOLN
INTRAMUSCULAR | Status: AC
  Filled 2016-08-09: qty 5

## 2016-08-09 NOTE — Patient Instructions (Addendum)
____________________________________________________________________________________________  Post-Procedure instructions Instructions:  Apply ice: Fill a plastic sandwich bag with crushed ice. Cover it with a small towel and apply to injection site. Apply for 15 minutes then remove x 15 minutes. Repeat sequence on day of procedure, until you go to bed. The purpose is to minimize swelling and discomfort after procedure.  Apply heat: Apply heat to procedure site starting the day following the procedure. The purpose is to treat any soreness and discomfort from the procedure.  Food intake: Start with clear liquids (like water) and advance to regular food, as tolerated.   Physical activities: Keep activities to a minimum for the first 8 hours after the procedure.   Driving: If you have received any sedation, you are not allowed to drive for 24 hours after your procedure.  Blood thinner: Restart your blood thinner 6 hours after your procedure. (Only for those taking blood thinners)  Insulin: As soon as you can eat, you may resume your normal dosing schedule. (Only for those taking insulin)  Infection prevention: Keep procedure site clean and dry.  Post-procedure Pain Diary: Extremely important that this be done correctly and accurately. Recorded information will be used to determine the next step in treatment.  Pain evaluated is that of treated area only. Do not include pain from an untreated area.  Complete every hour, on the hour, for the initial 8 hours. Set an alarm to help you do this part accurately.  Do not go to sleep and have it completed later. It will not be accurate.  Follow-up appointment: Keep your follow-up appointment after the procedure. Usually 2 weeks for most procedures. (6 weeks in the case of radiofrequency.) Bring you pain diary.  Expect:  From numbing medicine (AKA: Local Anesthetics): Numbness or decrease in pain.  Onset: Full effect within 15 minutes of  injected.  Duration: It will depend on the type of local anesthetic used. On the average, 1 to 8 hours.   From steroids: Decrease in swelling or inflammation. Once inflammation is improved, relief of the pain will follow.  Onset of benefits: Depends on the amount of swelling present. The more swelling, the longer it will take for the benefits to be seen. In some cases, up to 10 days.  Duration: Steroids will stay in the system x 2 weeks. Duration of benefits will depend on multiple posibilities including persistent irritating factors.  From procedure: Some discomfort is to be expected once the numbing medicine wears off. This should be minimal if ice and heat are applied as instructed. Call if:  You experience numbness and weakness that gets worse with time, as opposed to wearing off.  New onset bowel or bladder incontinence. (Spinal procedures only)  Emergency Numbers:  Durning business hours (Monday - Thursday, 8:00 AM - 4:00 PM) (Friday, 9:00 AM - 12:00 Noon): [phone]  After hours: [phone] ____________________________________________________________________________________________  GENERAL RISKS AND COMPLICATIONS  What are the risk, side effects and possible complications? Generally speaking, most procedures are safe.  However, with any procedure there are risks, side effects, and the possibility of complications.  The risks and complications are dependent upon the sites that are lesioned, or the type of nerve block to be performed.  The closer the procedure is to the spine, the more serious the risks are.  Great care is taken when placing the radio frequency needles, block needles or lesioning probes, but sometimes complications can occur. Infection: Any time there is an injection through the skin, there is a risk  of infection.  This is why sterile conditions are used for these blocks.  There are four possible types of infection. Localized skin infection. Central  Nervous System Infection-This can be in the form of Meningitis, which can be deadly. Epidural Infections-This can be in the form of an epidural abscess, which can cause pressure inside of the spine, causing compression of the spinal cord with subsequent paralysis. This would require an emergency surgery to decompress, and there are no guarantees that the patient would recover from the paralysis. Discitis-This is an infection of the intervertebral discs.  It occurs in about 1% of discography procedures.  It is difficult to treat and it may lead to surgery.        2. Pain: the needles have to go through skin and soft tissues, will cause soreness.       3. Damage to internal structures:  The nerves to be lesioned may be near blood vessels or    other nerves which can be potentially damaged.       4. Bleeding: Bleeding is more common if the patient is taking blood thinners such as  aspirin, Coumadin, Ticiid, Plavix, etc., or if he/she have some genetic predisposition  such as hemophilia. Bleeding into the spinal canal can cause compression of the spinal  cord with subsequent paralysis.  This would require an emergency surgery to  decompress and there are no guarantees that the patient would recover from the  paralysis.       5. Pneumothorax:  Puncturing of a lung is a possibility, every time a needle is introduced in  the area of the chest or upper back.  Pneumothorax refers to free air around the  collapsed lung(s), inside of the thoracic cavity (chest cavity).  Another two possible  complications related to a similar event would include: Hemothorax and Chylothorax.   These are variations of the Pneumothorax, where instead of air around the collapsed  lung(s), you may have blood or chyle, respectively.       6. Spinal headaches: They may occur with any procedures in the area of the spine.       7. Persistent CSF (Cerebro-Spinal Fluid) leakage: This is a rare problem, but may occur  with prolonged intrathecal  or epidural catheters either due to the formation of a fistulous  track or a dural tear.       8. Nerve damage: By working so close to the spinal cord, there is always a possibility of  nerve damage, which could be as serious as a permanent spinal cord injury with  paralysis.       9. Death:  Although rare, severe deadly allergic reactions known as "Anaphylactic  reaction" can occur to any of the medications used.      10. Worsening of the symptoms:  We can always make thing worse.  What are the chances of something like this happening? Chances of any of this occuring are extremely low.  By statistics, you have more of a chance of getting killed in a motor vehicle accident: while driving to the hospital than any of the above occurring .  Nevertheless, you should be aware that they are possibilities.  In general, it is similar to taking a shower.  Everybody knows that you can slip, hit your head and get killed.  Does that mean that you should not shower again?  Nevertheless always keep in mind that statistics do not mean anything if you happen to be on the wrong  side of them.  Even if a procedure has a 1 (one) in a 1,000,000 (million) chance of going wrong, it you happen to be that one..Also, keep in mind that by statistics, you have more of a chance of having something go wrong when taking medications.  Who should not have this procedure? If you are on a blood thinning medication (e.g. Coumadin, Plavix, see list of "Blood Thinners"), or if you have an active infection going on, you should not have the procedure.  If you are taking any blood thinners, please inform your physician.  How should I prepare for this procedure? Do not eat or drink anything at least six hours prior to the procedure. Bring a driver with you .  It cannot be a taxi. Come accompanied by an adult that can drive you back, and that is strong enough to help you if your legs get weak or numb from the local anesthetic. Take all of your  medicines the morning of the procedure with just enough water to swallow them. If you have diabetes, make sure that you are scheduled to have your procedure done first thing in the morning, whenever possible. If you have diabetes, take only half of your insulin dose and notify our nurse that you have done so as soon as you arrive at the clinic. If you are diabetic, but only take blood sugar pills (oral hypoglycemic), then do not take them on the morning of your procedure.  You may take them after you have had the procedure. Do not take aspirin or any aspirin-containing medications, at least eleven (11) days prior to the procedure.  They may prolong bleeding. Wear loose fitting clothing that may be easy to take off and that you would not mind if it got stained with Betadine or blood. Do not wear any jewelry or perfume Remove any nail coloring.  It will interfere with some of our monitoring equipment.  NOTE: Remember that this is not meant to be interpreted as a complete list of all possible complications.  Unforeseen problems may occur.  BLOOD THINNERS The following drugs contain aspirin or other products, which can cause increased bleeding during surgery and should not be taken for 2 weeks prior to and 1 week after surgery.  If you should need take something for relief of minor pain, you may take acetaminophen which is found in Tylenol,m Datril, Anacin-3 and Panadol. It is not blood thinner. The products listed below are.  Do not take any of the products listed below in addition to any listed on your instruction sheet.  A.P.C or A.P.C with Codeine Codeine Phosphate Capsules #3 Ibuprofen Ridaura  ABC compound Congesprin Imuran rimadil  Advil Cope Indocin Robaxisal  Alka-Seltzer Effervescent Pain Reliever and Antacid Coricidin or Coricidin-D  Indomethacin Rufen  Alka-Seltzer plus Cold Medicine Cosprin Ketoprofen S-A-C Tablets  Anacin Analgesic Tablets or Capsules Coumadin Korlgesic Salflex  Anacin  Extra Strength Analgesic tablets or capsules CP-2 Tablets Lanoril Salicylate  Anaprox Cuprimine Capsules Levenox Salocol  Anexsia-D Dalteparin Magan Salsalate  Anodynos Darvon compound Magnesium Salicylate Sine-off  Ansaid Dasin Capsules Magsal Sodium Salicylate  Anturane Depen Capsules Marnal Soma  APF Arthritis pain formula Dewitt's Pills Measurin Stanback  Argesic Dia-Gesic Meclofenamic Sulfinpyrazone  Arthritis Bayer Timed Release Aspirin Diclofenac Meclomen Sulindac  Arthritis pain formula Anacin Dicumarol Medipren Supac  Analgesic (Safety coated) Arthralgen Diffunasal Mefanamic Suprofen  Arthritis Strength Bufferin Dihydrocodeine Mepro Compound Suprol  Arthropan liquid Dopirydamole Methcarbomol with Aspirin Synalgos  ASA tablets/Enseals Disalcid Micrainin Tagament  Ascriptin Doan's Midol Talwin  Ascriptin A/D Dolene Mobidin Tanderil  Ascriptin Extra Strength Dolobid Moblgesic Ticlid  Ascriptin with Codeine Doloprin or Doloprin with Codeine Momentum Tolectin  Asperbuf Duoprin Mono-gesic Trendar  Aspergum Duradyne Motrin or Motrin IB Triminicin  Aspirin plain, buffered or enteric coated Durasal Myochrisine Trigesic  Aspirin Suppositories Easprin Nalfon Trillsate  Aspirin with Codeine Ecotrin Regular or Extra Strength Naprosyn Uracel  Atromid-S Efficin Naproxen Ursinus  Auranofin Capsules Elmiron Neocylate Vanquish  Axotal Emagrin Norgesic Verin  Azathioprine Empirin or Empirin with Codeine Normiflo Vitamin E  Azolid Emprazil Nuprin Voltaren  Bayer Aspirin plain, buffered or children's or timed BC Tablets or powders Encaprin Orgaran Warfarin Sodium  Buff-a-Comp Enoxaparin Orudis Zorpin  Buff-a-Comp with Codeine Equegesic Os-Cal-Gesic   Buffaprin Excedrin plain, buffered or Extra Strength Oxalid   Bufferin Arthritis Strength Feldene Oxphenbutazone   Bufferin plain or Extra Strength Feldene Capsules Oxycodone with Aspirin   Bufferin with Codeine Fenoprofen Fenoprofen Pabalate or  Pabalate-SF   Buffets II Flogesic Panagesic   Buffinol plain or Extra Strength Florinal or Florinal with Codeine Panwarfarin   Buf-Tabs Flurbiprofen Penicillamine   Butalbital Compound Four-way cold tablets Penicillin   Butazolidin Fragmin Pepto-Bismol   Carbenicillin Geminisyn Percodan   Carna Arthritis Reliever Geopen Persantine   Carprofen Gold's salt Persistin   Chloramphenicol Goody's Phenylbutazone   Chloromycetin Haltrain Piroxlcam   Clmetidine heparin Plaquenil   Cllnoril Hyco-pap Ponstel   Clofibrate Hydroxy chloroquine Propoxyphen         Before stopping any of these medications, be sure to consult the physician who ordered them.  Some, such as Coumadin (Warfarin) are ordered to prevent or treat serious conditions such as "deep thrombosis", "pumonary embolisms", and other heart problems.  The amount of time that you may need off of the medication may also vary with the medication and the reason for which you were taking it.  If you are taking any of these medications, please make sure you notify your pain physician before you undergo any procedures.         Post RF Discharge Instructions  You have just completed Radiofrequency Neurotomy.  The following instructions will provide you with information and guidelines for self-care upon discharge.  If at any time you have questions or concerns please call your physician.  General Instructions:  Be alert for signs of possible infection: redness, swelling, heat, red streaks, elevated temperature, fever.  Please notify your doctor immediately if you have unusual bleeding, trouble breathing, or loss of the ability to control your bowel or bladder.  What to expect:  Most procedures involve the use of local anesthetics (numbing medicine), steroids (anti-inflammatory medicines) and possibly sedation (relaxation or nerve medicine).  Sedation may affect your memory, not allowing you to remember the procedure, or the instructions that we  give you after it.  Because of this, you doctor may want to avoid providing you important information after the procedure, since you may not remember.  The doctor will be more than happy to go over the information upon your return.  Local anesthetics, on the other hand, may cause temporary numbness and weakness of the legs or arms, depending on the location of the block.  This numbness/weaknes may last 4-6 hours ( the duration of the local anesthetic).  During this period of numbness, you must be more careful than usual to prevent any injuries to th extremity.  Steroids will begin to work immediately after injected, but on the average, it will take 6-10 days for  the swelling to come down to the point where you will be able to tell a difference in terms of the pain.  In summary, you should expect for your pain to get better within 15-20 minutes after the procedure.  This relieve or numbness should last 4-6 hours, after which, it will wear off.  Once it wears off, you may experience more pain than usual for 4-6 days, or until the steroids "kick in".  This discomfort is due to the procedure itself.  To minimize this, we recommend applying ice (fill a plastic sandwich bag with ice and wrap it on a towel to prevent frostbite) to the area, 15 minutes on and 15 minutes off, the day of the procedure.  This will minimize any swelling.  Starting the next day, you should then start with heat (moist or dry, it does not matter).  Heat therapy should continue until the pain improves (4-6 days).  Be careful not to burn yourself.  In the case of Radiofrequency procedures, you should expect more pain than usual for 5 to 6 weeks after the procedure.  This is how long it takes the burned tissue to heal.  We cannot assess any definite results on the success of the procedure until this recovery period has elapsed.  Post-Procedure Care:  You will want to be careful in moving about.  Muscle spasms in the area of the injection may  occur.  Use ice or heat to the area is often helpful.   Occasionally, a spinal headache can develop.  This is different from a normal headache in the sense that it will not go away on it's own, despite normal measures.  If you develop a headache that does not seem to respond to conservative therapy, please let your physician know.  This can be treated with an epidural blood patch.   You may experience some numbness or redness,m however it should be short lived.  If persistent numbness occurs, contact your physician. (Persistent numbness would be defined as lasting more than 4-6 hours).  Use care in moving the effected arm or leg to avoid further injury.  You may be given a sling or crutches to use temporarily.  Some discoloration may be present in your arms or leg for 24-48 hours.  If you experience shortness of breath or if your lips or fingers develop a dusky or "blue" appearance, contact your physician or go to the emergency room.  Diet  No eating limitations, unless stipulated above.  Nevertheless, if you have had sedation, you may experience some nausea.  In this case, it may be wise to wait at least two hours prior to resuming regular diet.  Comfort  You may experience a temporary increase in pain.  You will also experience tenderness at the site of injection, which may be accompanied with muscle spasms.  Use of cold or heat is usually helpful.  Take your prescription as directed.  Always exercise caution when taking pain pills.  Activity:  For the first 24 hours after the procedure, do not drive a motor vehicle,  Operate heavy machinery or power tools or handle any weapons.  Consider walking with the use of an assistive device or accompanied by an adult for the next 24 hours.  Do not drink alcoholic beverages including beer.  Do not make any important decisions or sign any legal documents. Go home and rest today.  Resume activities tomorrow, as tolerated.  Use caution in moving about as you may  experience  mild leg weakness.  Use caution in cooking, use of household electrical appliances and climbing steps.  Medications  May resume pre-procedure medications.  Do not take any drugs, other than what has been prescribed to you.   Other Prescription given for Percocet.

## 2016-08-09 NOTE — Progress Notes (Signed)
Patient's Name: Lori Mckenzie  MRN: [id number]  Referring Provider: Ardine Eng, MD  DOB: 1965/11/30  PCP: Ardine Eng, MD  DOS: 08/09/2016  Note by: Gaspar Cola, MD  Service setting: Ambulatory outpatient  Specialty: Interventional Pain Management  Patient type: Established  Location: [facility] (AMB) Pain Management Facility  Visit type: Interventional Procedure   Primary Reason for Visit: Interventional Pain Management Treatment. CC: Back Pain (mid to lower back)  Procedure:  Anesthesia, Analgesia, Anxiolysis:  Type: Therapeutic Medial Branch Facet Radiofrequency Ablation Region: Lumbar Level: L2, L3, L4, L5, & S1 Medial Branch Level(s) Laterality: Left-Sided  Type: Local Anesthesia with Moderate (Conscious) Sedation Local Anesthetic: Lidocaine 1% Route: Intravenous (IV) IV Access: Secured Sedation: Meaningful verbal contact was maintained at all times during the procedure  Indication(s): Analgesia and Anxiety  Indications: 1. Lumbar facet syndrome (Location of Primary Source of Pain) (Bilateral) (R>L)   2. Lumbar spondylosis   3. Chronic low back pain (Location of Primary Source of Pain) (Bilateral) (R>L)   4. Acute postoperative pain    Lori Mckenzie has either failed to respond, was unable to tolerate, or simply did not get enough benefit from other more conservative therapies including, but not limited to: 1. Over-the-counter medications 2. Anti-inflammatory medications 3. Muscle relaxants 4. Membrane stabilizers 5. Opioids 6. Physical therapy 7. Modalities (Heat, ice, etc.) 8. Invasive techniques such as nerve blocks. Lori Mckenzie has attained more than 50% relief of the pain from a series of diagnostic injections conducted in separate occasions.  Pain Score: Pre-procedure: 4 /10 Post-procedure: 1 /10  Pre-op Assessment:  Previous date of service: 08/01/16 Service provided: Evaluation Lori Mckenzie is a 51 y.o. (year old), female patient, seen today for interventional  treatment. She  has a past surgical history that includes Abdominal hysterectomy; Tubal ligation; Colonoscopy with propofol (N/A, 06/23/2014); Esophagogastroduodenoscopy (N/A, 06/23/2014); Cholecystectomy (N/A, 07/15/2014); ERCP (N/A, 07/16/2014); Wisdom tooth extraction; ERCP (N/A, 10/03/2014); Esophagogastroduodenoscopy (egd) with propofol (N/A, 12/01/2015); and Esophagogastroduodenoscopy (N/A, 06/30/2016). Lori Mckenzie has a current medication list which includes the following prescription(s): alprazolam, cholecalciferol, citalopram, diclofenac sodium, dicyclomine, fluticasone, intrinsi b12-folate, furosemide, gabapentin, hydrochlorothiazide, insulin aspart, insulin detemir, ipratropium-albuterol, lactulose, naloxone, nystatin, potassium chloride sa, promethazine, rifaximin, rizatriptan, spironolactone, vitamin b-12, and oxycodone-acetaminophen, and the following Facility-Administered Medications: fentanyl, lactated ringers, lidocaine, midazolam, and ropivacaine (pf) 2 mg/ml (0.2%). Her primarily concern today is the Back Pain (mid to lower back)  Initial Vital Signs: Blood pressure (!) 145/100, pulse 96, temperature 97.8 F (36.6 C), resp. rate 16, height 5' 2"  (1.575 m), weight 260 lb (117.9 kg), SpO2 95 %. BMI: Estimated body mass index is 47.55 kg/m as calculated from the following:   Height as of this encounter: 5' 2"  (1.575 m).   Weight as of this encounter: 260 lb (117.9 kg).  Risk Assessment: Allergies: Reviewed. She is allergic to tape and vicodin [hydrocodone-acetaminophen].  Allergy Precautions: None required Coagulopathies: Reviewed. None identified.  Blood-thinner therapy: None at this time Active Infection(s): Reviewed. None identified. Lori Mckenzie is afebrile  Site Confirmation: Lori Mckenzie was asked to confirm the procedure and laterality before marking the site Procedure checklist: Completed Consent: Before the procedure and under the influence of no sedative(s), amnesic(s), or anxiolytics,  the patient was informed of the treatment options, risks and possible complications. To fulfill our ethical and legal obligations, as recommended by the American Medical Association's Code of Ethics, I have informed the patient of my clinical impression; the nature and purpose of the treatment or procedure; the risks, benefits,  and possible complications of the intervention; the alternatives, including doing nothing; the risk(s) and benefit(s) of the alternative treatment(s) or procedure(s); and the risk(s) and benefit(s) of doing nothing. The patient was provided information about the general risks and possible complications associated with the procedure. These may include, but are not limited to: failure to achieve desired goals, infection, bleeding, organ or nerve damage, allergic reactions, paralysis, and death. In addition, the patient was informed of those risks and complications associated to Spine-related procedures, such as failure to decrease pain; infection (i.e.: Meningitis, epidural or intraspinal abscess); bleeding (i.e.: epidural hematoma, subarachnoid hemorrhage, or any other type of intraspinal or peri-dural bleeding); organ or nerve damage (i.e.: Any type of peripheral nerve, nerve root, or spinal cord injury) with subsequent damage to sensory, motor, and/or autonomic systems, resulting in permanent pain, numbness, and/or weakness of one or several areas of the body; allergic reactions; (i.e.: anaphylactic reaction); and/or death. Furthermore, the patient was informed of those risks and complications associated with the medications. These include, but are not limited to: allergic reactions (i.e.: anaphylactic or anaphylactoid reaction(s)); adrenal axis suppression; blood sugar elevation that in diabetics may result in ketoacidosis or comma; water retention that in patients with history of congestive heart failure may result in shortness of breath, pulmonary edema, and decompensation with  resultant heart failure; weight gain; swelling or edema; medication-induced neural toxicity; particulate matter embolism and blood vessel occlusion with resultant organ, and/or nervous system infarction; and/or aseptic necrosis of one or more joints. Finally, the patient was informed that Medicine is not an exact science; therefore, there is also the possibility of unforeseen or unpredictable risks and/or possible complications that may result in a catastrophic outcome. The patient indicated having understood very clearly. We have given the patient no guarantees and we have made no promises. Enough time was given to the patient to ask questions, all of which were answered to the patient's satisfaction. Lori Mckenzie has indicated that she wanted to continue with the procedure. Attestation: I, the ordering provider, attest that I have discussed with the patient the benefits, risks, side-effects, alternatives, likelihood of achieving goals, and potential problems during recovery for the procedure that I have provided informed consent. Date: 08/09/2016; Time: 10:32 AM  Pre-Procedure Preparation:  Monitoring: As per clinic protocol. Respiration, ETCO2, SpO2, BP, heart rate and rhythm monitor placed and checked for adequate function Safety Precautions: Patient was assessed for positional comfort and pressure points before starting the procedure. Time-out: I initiated and conducted the "Time-out" before starting the procedure, as per protocol. The patient was asked to participate by confirming the accuracy of the "Time Out" information. Verification of the correct person, site, and procedure were performed and confirmed by me, the nursing staff, and the patient. "Time-out" conducted as per Joint Commission's Universal Protocol (UP.01.01.01). "Time-out" Date & Time: 08/09/2016; 1059 hrs.  Description of Procedure Process:   Position: Prone Target Area: For Lumbar Facet blocks, the target is the groove formed by the  junction of the transverse process and superior articular process. For the L5 dorsal ramus, the target is the notch between superior articular process and sacral ala. For the S1 dorsal ramus, the target is the superior and lateral edge of the posterior S1 Sacral foramen. Approach: Paraspinal approach. Area Prepped: Entire Posterior Lumbosacral Region Prepping solution: Hibiclens (4.0% Chlorhexidine gluconate solution) Safety Precautions: Aspiration looking for blood return was conducted prior to all injections. At no point did we inject any substances, as a needle was being advanced. No  attempts were made at seeking any paresthesias. Safe injection practices and needle disposal techniques used. Medications properly checked for expiration dates. SDV (single dose vial) medications used. Description of the Procedure: Protocol guidelines were followed. The patient was placed in position over the fluoroscopy table. The target area was identified and the area prepped in the usual manner. Skin desensitized using vapocoolant spray. Skin & deeper tissues infiltrated with local anesthetic. Appropriate amount of time allowed to pass for local anesthetics to take effect. Radiofrequency needles were introduced to the area of the medial branch at the junction of the superior articular process and transverse process using fluoroscopy. Using the [employer], sensory stimulation using 50 Hz was used to locate & identify the nerve, making sure that the needle was positioned such that there was no sensory stimulation below 0.3 V or above 0.7 V. Stimulation using 2 Hz was used to evaluate the motor component. Care was taken not to lesion any nerves that demonstrated motor stimulation of the lower extremities at an output of less than 2.5 times that of the sensory threshold, or a maximum of 2.0 V. Once satisfactory placement of the needles was achieved, the above solution was slowly injected after negative  aspiration. After waiting for at least 2 minutes, the ablation was performed at 80 degrees C for 60 seconds.The needles were then removed and the area cleansed, making sure to leave some of the prepping solution back to take advantage of its long term bactericidal properties. Intra-operative Compliance: Compliant  Illustration of the posterior view of the lumbar spine and the posterior neural structures. Laminae of L2 through S1 are labeled. DPRL5, dorsal primary ramus of L5; DPRS1, dorsal primary ramus of S1; DPR3, dorsal primary ramus of L3; FJ, facet (zygapophyseal) joint L3-L4; I, inferior articular process of L4; LB1, lateral branch of dorsal primary ramus of L1; IAB, inferior articular branches from L3 medial branch (supplies L4-L5 facet joint); IBP, intermediate branch plexus; MB3, medial branch of dorsal primary ramus of L3; NR3, third lumbar nerve root; S, superior articular process of L5; SAB, superior articular branches from L4 (supplies L4-5 facet joint also); TP3, transverse process of L3.  Vitals:   08/09/16 1129 08/09/16 1139 08/09/16 1149 08/09/16 1159  BP: 123/84 135/89 140/88 131/85  Pulse: 85 88 93 93  Resp: 18 (!) 22 18 (!) 25  Temp:  98.5 F (36.9 C)  (!) 97.2 F (36.2 C)  SpO2: (!) 89% 94% 95% 95%  Weight:      Height:        Start Time: 1101 hrs. End Time: 1128 hrs. Materials & Medications:  Needle(s) Type: Teflon-coated, curved tip, Radiofrequency needle(s) Gauge: 20G Length: 15cm Medication(s): Lori Mckenzie had no medications administered during this visit. Please see chart orders for dosing details.  Imaging Guidance (Spinal):  Type of Imaging Technique: Fluoroscopy Guidance (Spinal) Indication(s): Assistance in needle guidance and placement for procedures requiring needle placement in or near specific anatomical locations not easily accessible without such assistance. Exposure Time: Please see nurses notes. Contrast: None used. Fluoroscopic Guidance: I was  personally present during the use of fluoroscopy. "Tunnel Vision Technique" used to obtain the best possible view of the target area. Parallax error corrected before commencing the procedure. "Direction-depth-direction" technique used to introduce the needle under continuous pulsed fluoroscopy. Once target was reached, antero-posterior, oblique, and lateral fluoroscopic projection used confirm needle placement in all planes. Images permanently stored in EMR. Interpretation: No contrast injected. I personally interpreted the imaging intraoperatively. Adequate  needle placement confirmed in multiple planes. Permanent images saved into the patient's record.  Antibiotic Prophylaxis:  Indication(s): None identified Antibiotic given: None  Post-operative Assessment:  EBL: None Complications: No immediate post-treatment complications observed by team, or reported by patient. Note: The patient tolerated the entire procedure well. A repeat set of vitals were taken after the procedure and the patient was kept under observation following institutional policy, for this type of procedure. Post-procedural neurological assessment was performed, showing return to baseline, prior to discharge. The patient was provided with post-procedure discharge instructions, including a section on how to identify potential problems. Should any problems arise concerning this procedure, the patient was given instructions to immediately contact us, at any time, without hesitation. In any case, we plan to contact the patient by telephone for a follow-up status report regarding this interventional procedure. Comments:  Unfortunately, the patient was not very cooperative when he came to the procedure. She moved significantly, despite the fact that she has been told that it was extremely important for her to stay still wants to needles had been placed in the local anesthetic injected. The patient was warned that with the amount of movement and  that she displayed and the lack of compliance keeping her head down, it would be very difficult for [place] to guarantee a successful procedure due to the fact that every time she moved and lifted her head this will contract the paravertebral muscles pulling our needles out of place.  Plan of Care  Disposition: Discharge home  Discharge Date & Time: 08/09/2016; 1205 hrs.  Physician-requested Follow-up:  Return for Post-RFA eval by Dr. Dossie Arbour in 6 weeks.  New Prescriptions   OXYCODONE-ACETAMINOPHEN (PERCOCET) 5-325 MG TABLET    Take 1-2 tablets by mouth every 6 (six) hours as needed for severe pain.   Future Appointments Date Time Provider [place]  09/21/2016 11:15 AM Milinda Pointer, MD [facility] None    Imaging Orders     DG C-Arm 1-60 Min-No Report  Procedure Orders     Radiofrequency,Lumbar Medications ordered for procedure: Meds ordered this encounter  Medications  . lactated ringers infusion 1,000 mL  . midazolam (VERSED) 5 MG/5ML injection 1-2 mg    Make sure Flumazenil is available in the pyxis when using this medication. If oversedation occurs, administer 0.2 mg IV over 15 sec. If after 45 sec no response, administer 0.2 mg again over 1 min; may repeat at 1 min intervals; not to exceed 4 doses (1 mg)  . fentaNYL (SUBLIMAZE) injection 25-50 mcg    Make sure Narcan is available in the pyxis when using this medication. In the event of respiratory depression (RR< 8/min): Titrate NARCAN (naloxone) in increments of 0.1 to 0.2 mg IV at 2-3 minute intervals, until desired degree of reversal.  . lidocaine 1.5 % injection 20 mL  . ropivacaine (PF) 2 mg/mL (0.2%) (NAROPIN) injection 10 mL  . oxyCODONE-acetaminophen (PERCOCET) 5-325 MG tablet    Sig: Take 1-2 tablets by mouth every 6 (six) hours as needed for severe pain.    Dispense:  80 tablet    Refill:  0    For acute post-operative pain. Not to be refilled. To last until: 09/19/16   Medications administered: Lori Mckenzie  had no medications administered during this visit.  See the medical record for exact dosing, route, and time of administration.  Primary Care Physician: Ardine Eng, MD Location: [facility] Note by: Gaspar Cola, MD Date: 08/09/2016; Time: 1:07 PM  Disclaimer:  Medicine is not an Chief Strategy Officer. The only guarantee in medicine is that nothing is guaranteed. It is important to note that the decision to proceed with this intervention was based on the information collected from the patient. The Data and conclusions were drawn from the patient's questionnaire, the interview, and the physical examination. Because the information was provided in large part by the patient, it cannot be guaranteed that it has not been purposely or unconsciously manipulated. Every effort has been made to obtain as much relevant data as possible for this evaluation. It is important to note that the conclusions that lead to this procedure are derived in large part from the available data. Always take into account that the treatment will also be dependent on availability of resources and existing treatment guidelines, considered by other Pain Management Practitioners as being common knowledge and practice, at the time of the intervention. For Medico-Legal purposes, it is also important to point out that variation in procedural techniques and pharmacological choices are the acceptable norm. The indications, contraindications, technique, and results of the above procedure should only be interpreted and judged by a Board-Certified Interventional Pain Specialist with extensive familiarity and expertise in the same exact procedure and technique.

## 2016-08-09 NOTE — Progress Notes (Signed)
Safety precautions to be maintained throughout the outpatient stay will include: orient to surroundings, keep bed in low position, maintain call bell within reach at all times, provide assistance with transfer out of bed and ambulation.  

## 2016-08-10 ENCOUNTER — Telehealth: Payer: Self-pay | Admitting: *Deleted

## 2016-08-10 NOTE — Telephone Encounter (Signed)
No answer and no voicemail capability.

## 2016-08-17 ENCOUNTER — Telehealth: Payer: Self-pay | Admitting: *Deleted

## 2016-08-17 NOTE — Telephone Encounter (Signed)
Patient asking about Oxycodone. States she still has some, but wondering when she can get refill. Patient informed that Dr. Dossie Arbour only gave her a script for post RF pain, we are working on getting PA for that.

## 2016-08-17 NOTE — Telephone Encounter (Signed)
Attempted to call patient, unable to leave a message.

## 2016-09-02 ENCOUNTER — Encounter: Payer: Self-pay | Admitting: Emergency Medicine

## 2016-09-02 ENCOUNTER — Emergency Department
Admission: EM | Admit: 2016-09-02 | Discharge: 2016-09-02 | Disposition: A | Discharge status: Home / Self Care | Payer: Medicaid Other | Attending: Emergency Medicine | Admitting: Emergency Medicine

## 2016-09-02 ENCOUNTER — Emergency Department: Payer: Medicaid Other

## 2016-09-02 DIAGNOSIS — E039 Hypothyroidism, unspecified: Secondary | ICD-10-CM | POA: Insufficient documentation

## 2016-09-02 DIAGNOSIS — E079 Disorder of thyroid, unspecified: Secondary | ICD-10-CM | POA: Diagnosis not present

## 2016-09-02 DIAGNOSIS — J45909 Unspecified asthma, uncomplicated: Secondary | ICD-10-CM | POA: Insufficient documentation

## 2016-09-02 DIAGNOSIS — E1165 Type 2 diabetes mellitus with hyperglycemia: Secondary | ICD-10-CM | POA: Diagnosis not present

## 2016-09-02 DIAGNOSIS — I129 Hypertensive chronic kidney disease with stage 1 through stage 4 chronic kidney disease, or unspecified chronic kidney disease: Secondary | ICD-10-CM | POA: Insufficient documentation

## 2016-09-02 DIAGNOSIS — E860 Dehydration: Secondary | ICD-10-CM | POA: Diagnosis not present

## 2016-09-02 DIAGNOSIS — Z79899 Other long term (current) drug therapy: Secondary | ICD-10-CM | POA: Insufficient documentation

## 2016-09-02 DIAGNOSIS — E1122 Type 2 diabetes mellitus with diabetic chronic kidney disease: Secondary | ICD-10-CM | POA: Diagnosis not present

## 2016-09-02 DIAGNOSIS — G8929 Other chronic pain: Secondary | ICD-10-CM

## 2016-09-02 DIAGNOSIS — N189 Chronic kidney disease, unspecified: Secondary | ICD-10-CM | POA: Insufficient documentation

## 2016-09-02 DIAGNOSIS — M545 Other chronic pain: Secondary | ICD-10-CM

## 2016-09-02 DIAGNOSIS — Z8673 Personal history of transient ischemic attack (TIA), and cerebral infarction without residual deficits: Secondary | ICD-10-CM | POA: Diagnosis not present

## 2016-09-02 DIAGNOSIS — R739 Hyperglycemia, unspecified: Secondary | ICD-10-CM

## 2016-09-02 DIAGNOSIS — Z794 Long term (current) use of insulin: Secondary | ICD-10-CM | POA: Insufficient documentation

## 2016-09-02 DIAGNOSIS — J449 Chronic obstructive pulmonary disease, unspecified: Secondary | ICD-10-CM | POA: Insufficient documentation

## 2016-09-02 DIAGNOSIS — R0602 Shortness of breath: Secondary | ICD-10-CM | POA: Diagnosis present

## 2016-09-02 LAB — CBC
HCT: 43.2 % (ref 35.0–47.0)
Hemoglobin: 15 g/dL (ref 12.0–16.0)
MCH: 35.6 pg — ABNORMAL HIGH (ref 26.0–34.0)
MCHC: 34.7 g/dL (ref 32.0–36.0)
MCV: 102.7 fL — ABNORMAL HIGH (ref 80.0–100.0)
Platelets: 88 10*3/uL — ABNORMAL LOW (ref 150–440)
RBC: 4.21 MIL/uL (ref 3.80–5.20)
RDW: 14.1 % (ref 11.5–14.5)
WBC: 4.5 10*3/uL (ref 3.6–11.0)

## 2016-09-02 LAB — BASIC METABOLIC PANEL
Anion gap: 12 (ref 5–15)
BUN: 8 mg/dL (ref 6–20)
CO2: 25 mmol/L (ref 22–32)
Calcium: 9.4 mg/dL (ref 8.9–10.3)
Chloride: 99 mmol/L — ABNORMAL LOW (ref 101–111)
Creatinine, Ser: 0.63 mg/dL (ref 0.44–1.00)
GFR calc Af Amer: >60 mL/min (ref >60)
GFR calc non Af Amer: >60 mL/min (ref >60)
Glucose, Bld: 465 mg/dL — ABNORMAL HIGH (ref 65–99)
Potassium: 3.7 mmol/L (ref 3.5–5.1)
Sodium: 136 mmol/L (ref 135–145)

## 2016-09-02 LAB — GLUCOSE, CAPILLARY: Glucose-Capillary: 422 mg/dL — ABNORMAL HIGH (ref 65–99)

## 2016-09-02 MED ORDER — SODIUM CHLORIDE 0.9 % IV BOLUS (SEPSIS)
1000.0000 mL | Freq: Once | INTRAVENOUS | Status: AC
  Administered 2016-09-02: 1000 mL via INTRAVENOUS

## 2016-09-02 MED ORDER — HYDROMORPHONE HCL 1 MG/ML IJ SOLN
1.0000 mg | Freq: Once | INTRAMUSCULAR | Status: AC
  Administered 2016-09-02: 1 mg via INTRAVENOUS
  Filled 2016-09-02: qty 1

## 2016-09-02 NOTE — ED Notes (Signed)
Pt. Going home with husband.

## 2016-09-02 NOTE — ED Provider Notes (Signed)
[facility] Emergency Department Provider Note  ____________________________________________  Time seen: Approximately 8:20 PM  I have reviewed the triage vital signs and the nursing notes.   HISTORY  Chief Complaint Shortness of Breath and Hyperglycemia    HPI Lori Mckenzie is a 51 y.o. female complains of elevated blood sugars for the past week, polyuria and increased thirst. No fever or chills. Has chronic low back pain which continues to hurt after an unsuccessful radiofrequency ablation of the lumbar spine on 08/09/2016 in the pain clinic. Review of pain clinic note shows that the procedure was very challenging due to patient movement throughout the attempted procedure. In any event, patient denies any bowel or bladder retention or incontinence. No lower extremity weakness or paresthesias. No fever or chills or worsening back pain. She does have pain with movement, alleviated by lying still, severe, sharp, as per her chronic baseline.    She's continued using her twice daily Levemir as well as sliding scale insulin but blood sugar has been difficult to control recently.     Past Medical History:  Diagnosis Date  . Abdominal abscess 08/25/2014  . Acid reflux 08/10/2010  . Acute cervical myofascial strain 02/09/2016  . Anxiety   . Ascites   . Asthma   . Back pain   . Bile leak, postoperative 07/17/2014  . Brittle bone disease   . Cancer ([place])    Uteriine  ca 82yr ago partial hysterectomy  . Cervical disc disease   . Chronic kidney disease   . Collagen vascular disease ([place]    RA  3-4 yrs ago  . COPD (chronic obstructive pulmonary disease) ([place]   . Diabetes mellitus without complication ([place]   . GERD (gastroesophageal reflux disease)   . Hypertension   . Hypothyroidism   . Left upper quadrant pain 01/09/2014  . Major depressive disorder with single episode 12/05/2011  . Major depressive disorder, single episode 12/05/2011  . Migraines   . NASH  (nonalcoholic steatohepatitis)   . Respiratory infection    2/17  . Shock ([place] 09/18/2014  . Sleep apnea   . Sleep apnea   . Syncope 11/16/2014  . Thyroid disease   . TIA (transient ischemic attack)      Patient Active Problem List   Diagnosis Date Noted  . Acute postoperative pain 08/09/2016  . Low back pain due to L1-2 disc extrusion (caudal) (Left) 07/11/2016  . Abdominal pain 06/28/2016  . Constipation 06/20/2016  . Hyperglycemia 06/20/2016  . Nausea without vomiting 06/06/2016  . Chronic pain of lower extremity, bilateral 05/19/2016  . Spinal stenosis, thoracic region (T10-11) 05/19/2016  . Diabetes mellitus, insulin dependent (IDDM), uncontrolled ([place] 04/12/2016  . Chronic sacroiliac joint pain (Bilateral) (L>R) 03/23/2016  . Chronic pain of left upper extremity 03/10/2016  . Chronic radicular cervical pain (L) 03/10/2016  . Osteoarthritis 02/24/2016  . Allodynia 02/09/2016  . Chronic pain syndrome 01/07/2016  . Opiate withdrawal ([place] 12/22/2015  . Elevated liver enzymes 09/28/2015  . Cirrhosis of liver without ascites ([place] 09/24/2015  . Cirrhosis ([place] 09/24/2015  . Altered mental status 07/24/2015  . Uncontrolled diabetes mellitus ([place] 07/24/2015  . Radicular pain of thoracic region 04/06/2015  . Hepatic encephalopathy ([place] 03/11/2015  . Elevated sedimentation rate 03/05/2015  . Elevated C-reactive protein (CRP) 03/05/2015  . Lumbar facet syndrome (Location of Primary Source of Pain) (Bilateral) (R>L) 03/05/2015  . Cervical spondylosis 03/05/2015  . Chronic feet pain (Location of Secondary source of pain) (Bilateral) (R>L) 03/05/2015  .  Lumbar spondylosis 03/05/2015  . Encounter for chronic pain management 03/05/2015  . Chronic shoulder pain (Bilateral) (R>L) 03/05/2015  . Chronic carpal tunnel syndrome (Bilateral) 03/05/2015  . Chronic hip pain (Bilateral) (L>R) 03/05/2015  . Chronic upper back pain (Bilateral) (L>R) 03/05/2015  . Osteoporosis, idiopathic  03/05/2015  . Abnormal MRI, lumbar spine (02/03/2015) 03/05/2015  . Thoracic radiculitis 02/05/2015  . Vitamin D deficiency 01/13/2015  . B12 deficiency 01/13/2015  . Folate deficiency 01/13/2015  . Subacute lumbar radiculopathy (left side) (S1 dermatome) 12/10/2014  . Drowsiness 11/16/2014  . Episode of syncope 11/16/2014  . Somnolence 11/16/2014  . Opiate use (75 MME/Day) 10/28/2014  . Long term prescription opiate use 10/28/2014  . Long term current use of opiate analgesic 10/28/2014  . Encounter for therapeutic drug level monitoring 10/28/2014  . Chronic epigastric abdominal pain 10/28/2014  . Chronic low back pain (Location of Primary Source of Pain) (Bilateral) (R>L) 10/28/2014  . Chronic neck pain (Location of Tertiary source of pain) (Bilateral) (R>L) 10/28/2014  . Ascites 09/05/2014  . NASH (nonalcoholic steatohepatitis) 09/05/2014  . Hypokalemia 09/05/2014  . Dysthymia 08/05/2014  . Other social stressor 08/05/2014  . Steatohepatitis 07/15/2014  . Type 2 diabetes mellitus ([place]) 07/12/2014  . COPD (chronic obstructive pulmonary disease) ([place]) 07/12/2014  . GERD (gastroesophageal reflux disease) 07/12/2014  . OSA on CPAP 07/12/2014  . Anxiety 07/12/2014  . 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 Lumbar and sacral osteoarthritis 03/26/2014  . Myofascial pain 03/26/2014  . Lumbar spinal stenosis 03/26/2014  . Lumbosacral spondylosis without myelopathy 03/26/2014  . 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 Spinal stenosis, lumbar region without neurogenic claudication (L1-2 and L4-5) 03/26/2014  . Spondylosis of lumbar region without myelopathy or radiculopathy 03/26/2014  . Breath shortness 01/09/2014  . Diastolic dysfunction 94/70/9628  . Airway hyperreactivity 08/06/2013  . Essential (primary) hypertension 08/06/2013  . Asthma 08/06/2013  . Clinical depression 12/05/2011     Past Surgical History:  Procedure Laterality Date  . ABDOMINAL  HYSTERECTOMY    . CHOLECYSTECTOMY N/A 07/15/2014   Procedure: LAPAROSCOPIC CHOLECYSTECTOMY with liver biopsy ;  Surgeon: Sherri Rad, MD;  Location: [facility] ORS;  Service: General;  Laterality: N/A;  . COLONOSCOPY WITH PROPOFOL N/A 06/23/2014   Procedure: COLONOSCOPY WITH PROPOFOL;  Surgeon: Lollie Sails, MD;  Location: [facility] ENDOSCOPY;  Service: Endoscopy;  Laterality: N/A;  . ERCP N/A 07/16/2014   Procedure: ENDOSCOPIC RETROGRADE CHOLANGIOPANCREATOGRAPHY (ERCP);  Surgeon: Clarene Essex, MD;  Location: [facility] ENDOSCOPY;  Service: Endoscopy;  Laterality: N/A;  . ERCP N/A 10/03/2014   Procedure: ENDOSCOPIC RETROGRADE CHOLANGIOPANCREATOGRAPHY (ERCP);  Surgeon: Hulen Luster, MD;  [facility] END[facility]COPY;  Service: Gastroenterology;  Laterality: N/A;  . ESOPHAGOGASTRODUODENOSCOPY N/A 06/23/2014   Procedure: ESOPHAGOGASTRODUODENOSCOPY (EGD);  Surgeon: Lollie Sails, MD;  Location: [facility] ENDOSCOPY;  Service: Endoscopy;  Laterality: N/A;  . ESOPHAGOGASTRODUODENOSCOPY N/A 06/30/2016   Procedure: ESOPHAGOGASTRODUODENOSCOPY (EGD);  Surgeon: Jonathon Bellows, MD;  Location: [facility] ENDOSCOPY;  Service: Endoscopy;  Laterality: N/A;  . ESOPHAGOGASTRODUODENOSCOPY (EGD) WITH PROPOFOL N/A 12/01/2015   Procedure: ESOPHAGOGASTRODUODENOSCOPY (EGD) WITH PROPOFOL;  Surgeon: Lollie Sails, MD;  Location: [facility] ENDOSCOPY;  Service: Endoscopy;  Laterality: N/A;  . TUBAL LIGATION    . WISDOM TOOTH EXTRACTION       Prior to Admission medications   Medication Sig Start Date End Date Taking? Authorizing Provider  ALPRAZolam Duanne Moron) 1 MG tablet Take 1 mg by mouth 3 (three) times daily.     [provider]  Cholecalciferol (VITAMIN D-1000  MAX ST) 1000 units tablet Take 2,000 Units by mouth daily.     [provider]  citalopram (CELEXA) 20 MG tablet Take 30 mg by mouth daily.  07/24/15   [provider]  diclofenac sodium (VOLTAREN) 1 % GEL Apply 4 g topically 4 (four) times daily. 04/12/16 10/09/16  Milinda Pointer, MD  dicyclomine (BENTYL) 10 MG capsule Take 10 mg by mouth 2 (two) times daily.     [provider]  fluticasone (FLONASE) 50 MCG/ACT nasal spray Place 1-2 sprays into both nostrils daily.     [provider]  Folate-B12-Intrinsic Factor (INTRINSI B12-FOLATE) 810-175-10 MCG-MCG-MG TABS Take 1 tablet by mouth daily. Reported on 04/06/2015    [provider]  furosemide (LASIX) 40 MG tablet Take 40 mg by mouth daily.     [provider]  gabapentin (NEURONTIN) 300 MG capsule Take 600 mg by mouth 4 (four) times daily as needed (for pain).     [provider]  hydrochlorothiazide (MICROZIDE) 12.5 MG capsule Take 12.5 mg by mouth as needed.    [provider]  insulin aspart (NOVOLOG) 100 UNIT/ML injection 30 units with meals 3 times daily plus sliding scale. 06/03/16   [provider]  Insulin Detemir (LEVEMIR FLEXPEN [place]) Inject 50 Units into the skin 2 (two) times daily.     [provider]  ipratropium-albuterol (DUONEB) 0.5-2.5 (3) MG/3ML SOLN Take 3 mLs by nebulization every 4 (four) hours as needed.    [provider]  lactulose (CHRONULAC) 10 GM/15ML solution Take 60 g by mouth 2 (two) times daily.  03/13/15   [provider]  naloxone Karma Greaser) 2 MG/2ML injection Inject into the vein as needed.    [provider]  nystatin (MYCOSTATIN) powder Apply 1 g topically 3 (three) times daily as needed (for irritation).     [provider]  oxyCODONE-acetaminophen (PERCOCET) 5-325 MG tablet Take 1-2 tablets by mouth every 6 (six) hours as needed for severe pain. 08/09/16 08/19/16  Milinda Pointer, MD  potassium chloride SA (K-DUR,KLOR-CON) 20 MEQ tablet Take 10-20 mEq by mouth daily.     [provider]  promethazine (PHENERGAN) 12.5 MG tablet Take 12.5 mg by mouth every 6 (six) hours as needed for nausea or vomiting.     [provider]  rifaximin (XIFAXAN) 550 MG TABS tablet  Take 550 mg by mouth 2 (two) times daily.     [provider]  rizatriptan (MAXALT) 10 MG tablet Take 10 mg by mouth as needed for migraine.     [provider]  spironolactone (ALDACTONE) 100 MG tablet Take 100 mg by mouth daily.     [provider]  vitamin B-12 (CYANOCOBALAMIN) 1000 MCG tablet Take 2,000 mcg by mouth daily.    [provider]     Allergies Tape and Vicodin [hydrocodone-acetaminophen]   Family History  Problem Relation Age of Onset  . Lung cancer Mother   . Ulcers Father   . Emphysema Father   . Heart disease Sister   . Ulcers Sister   . Heart disease Brother     Social History Social History  Substance Use Topics  . Smoking status: Never Smoker  . Smokeless tobacco: Never Used  . Alcohol use No     Comment: occ    Review of Systems  Constitutional:   No fever or chills.  ENT:   No sore throat. No rhinorrhea. Cardiovascular:   No chest pain or syncope. Respiratory:  No dyspnea or cough. Gastrointestinal:   Negative for abdominal pain, vomiting and diarrhea.  Musculoskeletal:   chronic low back pain All other systems reviewed and are negative except as documented above in ROS and HPI.  ____________________________________________   PHYSICAL EXAM:  VITAL SIGNS: ED Triage Vitals  Enc Vitals Group     BP 09/02/16 1458 116/63     Pulse Rate 09/02/16 1458 (!) 112     Resp 09/02/16 1458 (!) 24     Temp 09/02/16 1458 99.3 F (37.4 C)     Temp Source 09/02/16 1458 Oral     SpO2 09/02/16 1458 94 %     Weight 09/02/16 1459 260 lb (117.9 kg)     Height 09/02/16 1459 5' 2"  (1.575 m)     Head Circumference --      Peak Flow --      Pain Score 09/02/16 1458 9     Pain Loc --      Pain Edu? --      Excl. in [place]? --     Vital signs reviewed, nursing assessments reviewed.   Constitutional:   Alert and oriented. Well appearing and in no distress. Eyes:   No scleral icterus.  EOMI. No nystagmus. No conjunctival  pallor. PERRL. ENT   Head:   Normocephalic and atraumatic.   Nose:   No congestion/rhinnorhea.    Mouth/Throat:   MMM, no pharyngeal erythema. No peritonsillar mass.    Neck:   No meningismus. Full ROM Hematological/Lymphatic/Immunilogical:   No cervical lymphadenopathy. Cardiovascular:   RRR. Symmetric bilateral radial and DP pulses.  No murmurs.  Respiratory:   Normal respiratory effort without tachypnea/retractions. Breath sounds are clear and equal bilaterally. No wheezes/rales/rhonchi. Gastrointestinal:   Soft and nontender. Non distended. There is no CVA tenderness.  No rebound, rigidity, or guarding. Genitourinary:   deferred Musculoskeletal:   Normal range of motion in all extremities. No joint effusions.  No lower extremity tenderness.  No edema.lumbar spine tenderness in the area of L4-L5. Neurologic:   Normal speech and language.  Motor grossly intact.EHL strong No gross focal neurologic deficits are appreciated.  Skin:    Skin is warm, dry and intact. No rash noted.  No petechiae, purpura, or bullae.  ____________________________________________    LABS (pertinent positives/negatives) (all labs ordered are listed, but only abnormal results are displayed) Labs Reviewed  BASIC METABOLIC PANEL - Abnormal; Notable for the following:       Result Value   Chloride 99 (*)    Glucose, Bld 465 (*)    All other components within normal limits  CBC - Abnormal; Notable for the following:    MCV 102.7 (*)    MCH 35.6 (*)    Platelets 88 (*)    All other components within normal limits  GLUCOSE, CAPILLARY - Abnormal; Notable for the following:    Glucose-Capillary 422 (*)    All other components within normal limits  URINALYSIS, COMPLETE (UACMP) WITH MICROSCOPIC  CBG MONITORING, ED   ____________________________________________   EKG  interpreted by me Sinus tachycardia rate 110, normal axis intervals QRS ST segments and T  waves.  ____________________________________________    RADIOLOGY  Dg Chest 2 View  Result Date: 09/02/2016 CLINICAL DATA:  Initial evaluation for acute shortness of breath. EXAM: CHEST  2 VIEW COMPARISON:  Prior radiograph from 07/12/2016. FINDINGS: Mild cardiomegaly, stable from previous. Mediastinal silhouette within normal limits. Lungs mildly hypoinflated. Mild perihilar vascular and interstitial congestion without pulmonary edema. No focal infiltrates.  No pleural effusion. No pneumothorax. No acute osseous abnormality. IMPRESSION: Stable cardiomegaly with mild perihilar and interstitial vascular congestion without frank pulmonary edema. Electronically Signed   By: Jeannine Boga M.D.   On: 09/02/2016 15:43   Ct Lumbar Spine Wo Contrast  Result Date: 09/02/2016 CLINICAL DATA:  Initial evaluation for chronic back pain, recent spine injection. EXAM: CT LUMBAR SPINE WITHOUT CONTRAST TECHNIQUE: Multidetector CT imaging of the lumbar spine was performed without intravenous contrast administration. Multiplanar CT image reconstructions were also generated. COMPARISON:  Prior MRI from 02/03/2015. FINDINGS: Segmentation: Normal segmentation. Lowest well-formed disc labeled the L5-S1 level. Alignment: Trace levoscoliosis with apex at L4-5. Mild straightening of the normal lumbar lordosis. Trace anterolisthesis of L4 on L5. No malalignment. Vertebrae: Vertebral body heights maintained. No evidence for acute or chronic fracture. Few scattered degenerative endplate Schmorl's nodes noted, most notable about the L2-3 interspace. No discrete osseous lesions. Visualized sacrum intact. SI joints approximated and symmetric. Paraspinal and other soft tissues: Paraspinous soft tissues demonstrate no acute abnormality. Mild atherosclerotic plaque within the infrarenal aorta. Visualized visceral structures otherwise unremarkable. Disc levels: T11-12: Shallow posterior osseous ridging mildly indents the ventral  thecal sac. No canal or foraminal stenosis. T12-L1: Right paracentral disc protrusion with associated spurring mildly indents the ventral thecal sac. No significant stenosis. This is stable from previous. L1-2: Small left paracentral disc protrusion indents the ventral thecal sac. Mild facet arthrosis. No significant canal or foraminal stenosis. This is stable from previous. L2-3: Minimal disc bulge with mild bilateral facet arthropathy. No stenosis. L3-4: Minimal annular disc bulge. Moderate bilateral facet arthrosis. No significant stenosis. L4-5: Trace anterolisthesis. Minimal annular disc bulge. Moderate bilateral facet arthrosis. Resultant mild to moderate spinal stenosis, unchanged. No significant foraminal encroachment. L5-S1:  Mild disc bulge and facet hypertrophy.  No stenosis. Overall appearance of the degenerative changes are not significantly changed relative to previous MRI. No complication identified status post recent injection. IMPRESSION: 1. No acute abnormality within the lumbar spine. No complication identified status post recent injection. 2. Multilevel disc degeneration with facet arthropathy as above, most notable at L4-5. Overall, appearance is not significantly changed relative to most recent MRI from 02/03/2015. Electronically Signed   By: Jeannine Boga M.D.   On: 09/02/2016 19:51    ____________________________________________   PROCEDURES Procedures  ____________________________________________   INITIAL IMPRESSION / ASSESSMENT AND PLAN / ED COURSE  Pertinent labs & imaging results that were available during my care of the patient were reviewed by me and considered in my medical decision making (see chart for details).  patient well appearing no acute distress presents with mild tachycardia in the setting of hyperglycemia, likely due to dehydration. Patient given IV fluids with resolution of the tachycardia. Patient given Dilaudid for control of her chronic pain.  Due to her underlying diabetes and risk factors and ongoing pain, CT of the lumbar spine was performed which does not show any acute changes. Low suspicion for epidural abscess hematoma or cauda equina syndrome. No other red flag symptoms other than ongoing chronic pain. She is suitable for discharge home and follow-up with her primary care doctor and pain management specialist.no acidosis on labs. No evidence of acute infection.      ____________________________________________   FINAL CLINICAL IMPRESSION(S) / ED DIAGNOSES  Final diagnoses:  Hyperglycemia  Dehydration  Chronic bilateral low back pain without sciatica      New Prescriptions   No medications on file     Portions of this note were generated with dragon  dictation software. Dictation errors may occur despite best attempts at proofreading.    Carrie Mew, MD 09/02/16 2025

## 2016-09-02 NOTE — ED Triage Notes (Signed)
Pt reports SOB that increased today but had been off and on for the past two days. Pt reports elevated blood sugars at home. Pt reports increased thirst. Pt reports chronic low back pain, reports recently had an injection in back. Pt goes to pain clinic.

## 2016-09-02 NOTE — Discharge Instructions (Signed)
Your tests today showed a high blood sugar level but otherwise were unremarkable. After IV fluids this was improving. Your CT scan of your back does not show any acute changes. Continue following up with your doctors for monitoring of your symptoms. Follow your blood sugars closely and use your sliding scale insulin. Drink lots of water to stay hydrated.

## 2016-09-07 ENCOUNTER — Ambulatory Visit: Payer: Medicaid Other | Admitting: Pain Medicine

## 2016-09-08 ENCOUNTER — Ambulatory Visit: Payer: Medicaid Other | Admitting: Pain Medicine

## 2016-09-21 ENCOUNTER — Ambulatory Visit: Payer: Medicaid Other | Admitting: Pain Medicine

## 2016-09-23 ENCOUNTER — Inpatient Hospital Stay
Admission: EM | Admit: 2016-09-23 | Discharge: 2016-09-25 | DRG: 442 | Disposition: A | Discharge status: Home / Self Care | Payer: Medicaid Other | Attending: Internal Medicine | Admitting: Internal Medicine

## 2016-09-23 ENCOUNTER — Emergency Department: Payer: Medicaid Other

## 2016-09-23 ENCOUNTER — Encounter: Payer: Self-pay | Admitting: Emergency Medicine

## 2016-09-23 DIAGNOSIS — E1122 Type 2 diabetes mellitus with diabetic chronic kidney disease: Secondary | ICD-10-CM | POA: Diagnosis present

## 2016-09-23 DIAGNOSIS — J449 Chronic obstructive pulmonary disease, unspecified: Secondary | ICD-10-CM | POA: Diagnosis present

## 2016-09-23 DIAGNOSIS — I129 Hypertensive chronic kidney disease with stage 1 through stage 4 chronic kidney disease, or unspecified chronic kidney disease: Secondary | ICD-10-CM | POA: Diagnosis present

## 2016-09-23 DIAGNOSIS — K766 Portal hypertension: Secondary | ICD-10-CM | POA: Diagnosis present

## 2016-09-23 DIAGNOSIS — Z9109 Other allergy status, other than to drugs and biological substances: Secondary | ICD-10-CM

## 2016-09-23 DIAGNOSIS — K3189 Other diseases of stomach and duodenum: Secondary | ICD-10-CM | POA: Diagnosis present

## 2016-09-23 DIAGNOSIS — K7460 Unspecified cirrhosis of liver: Secondary | ICD-10-CM | POA: Diagnosis present

## 2016-09-23 DIAGNOSIS — Z794 Long term (current) use of insulin: Secondary | ICD-10-CM

## 2016-09-23 DIAGNOSIS — R945 Abnormal results of liver function studies: Secondary | ICD-10-CM | POA: Diagnosis present

## 2016-09-23 DIAGNOSIS — Z90710 Acquired absence of both cervix and uterus: Secondary | ICD-10-CM | POA: Diagnosis not present

## 2016-09-23 DIAGNOSIS — E039 Hypothyroidism, unspecified: Secondary | ICD-10-CM | POA: Diagnosis present

## 2016-09-23 DIAGNOSIS — F329 Major depressive disorder, single episode, unspecified: Secondary | ICD-10-CM | POA: Diagnosis present

## 2016-09-23 DIAGNOSIS — Z8673 Personal history of transient ischemic attack (TIA), and cerebral infarction without residual deficits: Secondary | ICD-10-CM

## 2016-09-23 DIAGNOSIS — Q780 Osteogenesis imperfecta: Secondary | ICD-10-CM

## 2016-09-23 DIAGNOSIS — K7200 Acute and subacute hepatic failure without coma: Principal | ICD-10-CM | POA: Diagnosis present

## 2016-09-23 DIAGNOSIS — E1165 Type 2 diabetes mellitus with hyperglycemia: Secondary | ICD-10-CM | POA: Diagnosis present

## 2016-09-23 DIAGNOSIS — D696 Thrombocytopenia, unspecified: Secondary | ICD-10-CM | POA: Diagnosis present

## 2016-09-23 DIAGNOSIS — Z825 Family history of asthma and other chronic lower respiratory diseases: Secondary | ICD-10-CM | POA: Diagnosis not present

## 2016-09-23 DIAGNOSIS — R4182 Altered mental status, unspecified: Secondary | ICD-10-CM | POA: Diagnosis present

## 2016-09-23 DIAGNOSIS — K219 Gastro-esophageal reflux disease without esophagitis: Secondary | ICD-10-CM | POA: Diagnosis present

## 2016-09-23 DIAGNOSIS — Z79899 Other long term (current) drug therapy: Secondary | ICD-10-CM

## 2016-09-23 DIAGNOSIS — T40605A Adverse effect of unspecified narcotics, initial encounter: Secondary | ICD-10-CM | POA: Diagnosis present

## 2016-09-23 DIAGNOSIS — G4730 Sleep apnea, unspecified: Secondary | ICD-10-CM | POA: Diagnosis present

## 2016-09-23 DIAGNOSIS — Z8249 Family history of ischemic heart disease and other diseases of the circulatory system: Secondary | ICD-10-CM | POA: Diagnosis not present

## 2016-09-23 DIAGNOSIS — K7581 Nonalcoholic steatohepatitis (NASH): Secondary | ICD-10-CM | POA: Diagnosis present

## 2016-09-23 DIAGNOSIS — Z9049 Acquired absence of other specified parts of digestive tract: Secondary | ICD-10-CM

## 2016-09-23 DIAGNOSIS — K7290 Hepatic failure, unspecified without coma: Secondary | ICD-10-CM

## 2016-09-23 DIAGNOSIS — N189 Chronic kidney disease, unspecified: Secondary | ICD-10-CM | POA: Diagnosis present

## 2016-09-23 DIAGNOSIS — R1011 Right upper quadrant pain: Secondary | ICD-10-CM

## 2016-09-23 DIAGNOSIS — K5903 Drug induced constipation: Secondary | ICD-10-CM | POA: Diagnosis present

## 2016-09-23 DIAGNOSIS — K7682 Hepatic encephalopathy: Secondary | ICD-10-CM | POA: Diagnosis present

## 2016-09-23 DIAGNOSIS — Z885 Allergy status to narcotic agent status: Secondary | ICD-10-CM

## 2016-09-23 DIAGNOSIS — Z7951 Long term (current) use of inhaled steroids: Secondary | ICD-10-CM

## 2016-09-23 LAB — COMPREHENSIVE METABOLIC PANEL
ALT: 86 U/L — ABNORMAL HIGH (ref 14–54)
AST: 142 U/L — ABNORMAL HIGH (ref 15–41)
Albumin: 3.9 g/dL (ref 3.5–5.0)
Alkaline Phosphatase: 81 U/L (ref 38–126)
Anion gap: 9 (ref 5–15)
BUN: 8 mg/dL (ref 6–20)
CO2: 25 mmol/L (ref 22–32)
Calcium: 8.9 mg/dL (ref 8.9–10.3)
Chloride: 106 mmol/L (ref 101–111)
Creatinine, Ser: 0.57 mg/dL (ref 0.44–1.00)
GFR calc Af Amer: >60 mL/min (ref >60)
GFR calc non Af Amer: >60 mL/min (ref >60)
Glucose, Bld: 297 mg/dL — ABNORMAL HIGH (ref 65–99)
Potassium: 3.7 mmol/L (ref 3.5–5.1)
Sodium: 140 mmol/L (ref 135–145)
Total Bilirubin: 1.6 mg/dL — ABNORMAL HIGH (ref 0.3–1.2)
Total Protein: 7.2 g/dL (ref 6.5–8.1)

## 2016-09-23 LAB — URINALYSIS, COMPLETE (UACMP) WITH MICROSCOPIC
Bilirubin Urine: NEGATIVE
Glucose, UA: >=500 mg/dL — AB
Hgb urine dipstick: NEGATIVE
Ketones, ur: 5 mg/dL — AB
Leukocytes, UA: NEGATIVE
Nitrite: NEGATIVE
Protein, ur: 30 mg/dL — AB
Specific Gravity, Urine: 1.028 (ref 1.005–1.030)
pH: 5 (ref 5.0–8.0)

## 2016-09-23 LAB — CBC
HCT: 43.5 % (ref 35.0–47.0)
Hemoglobin: 15.3 g/dL (ref 12.0–16.0)
MCH: 35.9 pg — ABNORMAL HIGH (ref 26.0–34.0)
MCHC: 35.2 g/dL (ref 32.0–36.0)
MCV: 101.9 fL — ABNORMAL HIGH (ref 80.0–100.0)
Platelets: 90 10*3/uL — ABNORMAL LOW (ref 150–440)
RBC: 4.27 MIL/uL (ref 3.80–5.20)
RDW: 13.5 % (ref 11.5–14.5)
WBC: 4.5 10*3/uL (ref 3.6–11.0)

## 2016-09-23 LAB — AMMONIA: Ammonia: 86 umol/L — ABNORMAL HIGH (ref 9–35)

## 2016-09-23 LAB — MAGNESIUM: Magnesium: 1.8 mg/dL (ref 1.7–2.4)

## 2016-09-23 LAB — GLUCOSE, CAPILLARY
Glucose-Capillary: 280 mg/dL — ABNORMAL HIGH (ref 65–99)
Glucose-Capillary: 171 mg/dL — ABNORMAL HIGH (ref 65–99)

## 2016-09-23 LAB — LIPASE, BLOOD: Lipase: 29 U/L (ref 11–51)

## 2016-09-23 MED ORDER — FE FUMARATE-B12-VIT C-FA-IFC PO CAPS
1.0000 | ORAL_CAPSULE | Freq: Every day | ORAL | Status: DC
  Administered 2016-09-24 – 2016-09-25 (×2): 1 via ORAL
  Filled 2016-09-23 (×2): qty 1

## 2016-09-23 MED ORDER — ALPRAZOLAM 1 MG PO TABS
1.0000 mg | ORAL_TABLET | Freq: Three times a day (TID) | ORAL | Status: DC
  Administered 2016-09-23 – 2016-09-25 (×5): 1 mg via ORAL
  Filled 2016-09-23 (×5): qty 1

## 2016-09-23 MED ORDER — ONDANSETRON HCL 4 MG/2ML IJ SOLN
4.0000 mg | Freq: Four times a day (QID) | INTRAMUSCULAR | Status: DC | PRN
  Filled 2016-09-23: qty 2

## 2016-09-23 MED ORDER — PROMETHAZINE HCL 25 MG PO TABS
25.0000 mg | ORAL_TABLET | Freq: Two times a day (BID) | ORAL | Status: DC
  Administered 2016-09-23 – 2016-09-25 (×4): 25 mg via ORAL
  Filled 2016-09-23 (×5): qty 1

## 2016-09-23 MED ORDER — INSULIN ASPART 100 UNIT/ML ~~LOC~~ SOLN: 0.0000 [IU] | Freq: Every day | SUBCUTANEOUS | Status: DC

## 2016-09-23 MED ORDER — IOPAMIDOL (ISOVUE-300) INJECTION 61%
100.0000 mL | Freq: Once | INTRAVENOUS | Status: AC | PRN
  Administered 2016-09-23: 100 mL via INTRAVENOUS

## 2016-09-23 MED ORDER — GABAPENTIN 300 MG PO CAPS
600.0000 mg | ORAL_CAPSULE | Freq: Four times a day (QID) | ORAL | Status: DC
  Administered 2016-09-23 – 2016-09-25 (×6): 600 mg via ORAL
  Filled 2016-09-23 (×6): qty 2

## 2016-09-23 MED ORDER — SODIUM CHLORIDE 0.9% FLUSH: 3.0000 mL | INTRAVENOUS | Status: DC | PRN

## 2016-09-23 MED ORDER — SPIRONOLACTONE 25 MG PO TABS
100.0000 mg | ORAL_TABLET | Freq: Every day | ORAL | Status: DC
  Administered 2016-09-24 – 2016-09-25 (×2): 100 mg via ORAL
  Filled 2016-09-23 (×2): qty 4

## 2016-09-23 MED ORDER — INSULIN ASPART 100 UNIT/ML ~~LOC~~ SOLN
30.0000 [IU] | Freq: Three times a day (TID) | SUBCUTANEOUS | Status: DC
  Administered 2016-09-24 – 2016-09-25 (×5): 30 [IU] via SUBCUTANEOUS
  Filled 2016-09-23 (×5): qty 1

## 2016-09-23 MED ORDER — INSULIN ASPART 100 UNIT/ML ~~LOC~~ SOLN
0.0000 [IU] | Freq: Three times a day (TID) | SUBCUTANEOUS | Status: DC
  Administered 2016-09-24: 2 [IU] via SUBCUTANEOUS
  Administered 2016-09-24 (×2): 3 [IU] via SUBCUTANEOUS
  Administered 2016-09-25: 2 [IU] via SUBCUTANEOUS
  Administered 2016-09-25: 5 [IU] via SUBCUTANEOUS
  Filled 2016-09-23 (×5): qty 1

## 2016-09-23 MED ORDER — BISACODYL 5 MG PO TBEC: 5.0000 mg | DELAYED_RELEASE_TABLET | Freq: Every day | ORAL | Status: DC | PRN

## 2016-09-23 MED ORDER — LACTULOSE 10 GM/15ML PO SOLN
30.0000 g | Freq: Once | ORAL | Status: AC
Start: 2016-09-23 — End: 2016-09-23
  Administered 2016-09-23: 30 g via ORAL
  Filled 2016-09-23: qty 60

## 2016-09-23 MED ORDER — LACTULOSE 10 GM/15ML PO SOLN
20.0000 g | Freq: Four times a day (QID) | ORAL | Status: DC
  Administered 2016-09-23 – 2016-09-24 (×4): 20 g via ORAL
  Filled 2016-09-23 (×4): qty 30

## 2016-09-23 MED ORDER — ONDANSETRON HCL 4 MG PO TABS
4.0000 mg | ORAL_TABLET | Freq: Four times a day (QID) | ORAL | Status: DC | PRN
  Administered 2016-09-24: 4 mg via ORAL
  Filled 2016-09-23: qty 1

## 2016-09-23 MED ORDER — FLUTICASONE PROPIONATE 50 MCG/ACT NA SUSP
1.0000 | Freq: Every day | NASAL | Status: DC
  Filled 2016-09-23: qty 16

## 2016-09-23 MED ORDER — PANTOPRAZOLE SODIUM 40 MG PO TBEC
40.0000 mg | DELAYED_RELEASE_TABLET | Freq: Two times a day (BID) | ORAL | Status: DC
  Administered 2016-09-24 – 2016-09-25 (×3): 40 mg via ORAL
  Filled 2016-09-23 (×3): qty 1

## 2016-09-23 MED ORDER — RIFAXIMIN 550 MG PO TABS
550.0000 mg | ORAL_TABLET | Freq: Two times a day (BID) | ORAL | Status: DC
  Administered 2016-09-23 – 2016-09-25 (×4): 550 mg via ORAL
  Filled 2016-09-23 (×4): qty 1

## 2016-09-23 MED ORDER — VITAMIN B-12 1000 MCG PO TABS
2000.0000 ug | ORAL_TABLET | Freq: Every day | ORAL | Status: DC
  Administered 2016-09-24 – 2016-09-25 (×2): 2000 ug via ORAL
  Filled 2016-09-23 (×2): qty 2

## 2016-09-23 MED ORDER — FENTANYL CITRATE (PF) 100 MCG/2ML IJ SOLN
50.0000 ug | INTRAMUSCULAR | Status: DC | PRN
  Administered 2016-09-23: 50 ug via INTRAVENOUS
  Filled 2016-09-23: qty 2

## 2016-09-23 MED ORDER — INTRINSI B12-FOLATE 800-500-20 MCG-MCG-MG PO TABS: 1.0000 | ORAL_TABLET | Freq: Every day | ORAL | Status: DC

## 2016-09-23 MED ORDER — ENOXAPARIN SODIUM 40 MG/0.4ML ~~LOC~~ SOLN
40.0000 mg | Freq: Two times a day (BID) | SUBCUTANEOUS | Status: DC
  Administered 2016-09-24 – 2016-09-25 (×3): 40 mg via SUBCUTANEOUS
  Filled 2016-09-23 (×3): qty 0.4

## 2016-09-23 MED ORDER — DICYCLOMINE HCL 10 MG PO CAPS
10.0000 mg | ORAL_CAPSULE | Freq: Two times a day (BID) | ORAL | Status: DC
  Administered 2016-09-23 – 2016-09-25 (×4): 10 mg via ORAL
  Filled 2016-09-23 (×5): qty 1

## 2016-09-23 MED ORDER — SODIUM CHLORIDE 0.9% FLUSH
3.0000 mL | Freq: Two times a day (BID) | INTRAVENOUS | Status: DC
  Administered 2016-09-23 – 2016-09-25 (×4): 3 mL via INTRAVENOUS

## 2016-09-23 MED ORDER — FUROSEMIDE 40 MG PO TABS
40.0000 mg | ORAL_TABLET | Freq: Every day | ORAL | Status: DC
  Administered 2016-09-24 – 2016-09-25 (×2): 40 mg via ORAL
  Filled 2016-09-23 (×2): qty 1

## 2016-09-23 MED ORDER — SODIUM CHLORIDE 0.9 % IV SOLN: 250.0000 mL | INTRAVENOUS | Status: DC | PRN

## 2016-09-23 MED ORDER — ONDANSETRON HCL 4 MG/2ML IJ SOLN
4.0000 mg | Freq: Once | INTRAMUSCULAR | Status: AC
  Administered 2016-09-23: 4 mg via INTRAVENOUS
  Filled 2016-09-23: qty 2

## 2016-09-23 MED ORDER — SENNOSIDES-DOCUSATE SODIUM 8.6-50 MG PO TABS: 1.0000 | ORAL_TABLET | Freq: Every evening | ORAL | Status: DC | PRN

## 2016-09-23 MED ORDER — ONDANSETRON HCL 4 MG/2ML IJ SOLN
4.0000 mg | Freq: Once | INTRAMUSCULAR | Status: AC | PRN
  Administered 2016-09-23: 4 mg via INTRAVENOUS
  Filled 2016-09-23: qty 2

## 2016-09-23 MED ORDER — INSULIN DETEMIR 100 UNIT/ML ~~LOC~~ SOLN
50.0000 [IU] | Freq: Two times a day (BID) | SUBCUTANEOUS | Status: DC
  Administered 2016-09-23 – 2016-09-25 (×4): 50 [IU] via SUBCUTANEOUS
  Filled 2016-09-23 (×5): qty 0.5

## 2016-09-23 MED ORDER — IBUPROFEN 400 MG PO TABS: 400.0000 mg | ORAL_TABLET | Freq: Four times a day (QID) | ORAL | Status: DC | PRN

## 2016-09-23 MED ORDER — CITALOPRAM HYDROBROMIDE 20 MG PO TABS
30.0000 mg | ORAL_TABLET | Freq: Every day | ORAL | Status: DC
  Administered 2016-09-24 – 2016-09-25 (×2): 30 mg via ORAL
  Filled 2016-09-23 (×2): qty 2

## 2016-09-23 MED ORDER — KETOROLAC TROMETHAMINE 15 MG/ML IJ SOLN
15.0000 mg | Freq: Three times a day (TID) | INTRAMUSCULAR | Status: DC | PRN
  Administered 2016-09-23 – 2016-09-24 (×2): 15 mg via INTRAVENOUS
  Filled 2016-09-23 (×2): qty 1

## 2016-09-23 MED ORDER — ALBUTEROL SULFATE (2.5 MG/3ML) 0.083% IN NEBU
2.5000 mg | INHALATION_SOLUTION | RESPIRATORY_TRACT | Status: DC | PRN
  Administered 2016-09-24: 2.5 mg via RESPIRATORY_TRACT
  Filled 2016-09-23: qty 3

## 2016-09-23 NOTE — Progress Notes (Signed)
Lovenox changed to BID for CrCl >30 and BMI >40.

## 2016-09-23 NOTE — ED Triage Notes (Signed)
Patient presents to the ED by private vehicle.  Patient's husband brought patient to the ED and then left to see his doctor.  Husband states patient has been dizzy and had a headache since last night and has seemed somewhat confused.  Patient is keeping her eyes closed through most of triage and speech is slightly slurred-unsure if speech is baseline.  Patient's smile is even-patient does not have any teeth which may account for speech issue.  Patient has history of liver issues as well as diabetes.  Patient reports vomiting x 3-4 times since yesterday evening.

## 2016-09-23 NOTE — ED Provider Notes (Signed)
[facility] Emergency Department Provider Note    First MD Initiated Contact with Patient 09/23/16 1513     (approximate)  I have reviewed the triage vital signs and the nursing notes.   HISTORY  Chief Complaint Altered Mental Status; Emesis; and Headache    HPI Lori Mckenzie is a 51 y.o. female history of Nast cirrhosis status post cholecystectomy as well as recurrent ascites requiring multiple paracentesis presents with a chief complaint of right upper quadrant pain and dizziness headache and confusion. No fevers. Has been taking lactulose. No chills. Does feel more short of breath than usual but no cough. No dysuria. No blood in her stools or melena. No numbness or tingling.   Past Medical History:  Diagnosis Date  . Abdominal abscess 08/25/2014  . Acid reflux 08/10/2010  . Acute cervical myofascial strain 02/09/2016  . Anxiety   . Ascites   . Asthma   . Back pain   . Bile leak, postoperative 07/17/2014  . Brittle bone disease   . Cancer ([place])    Uteriine  ca 5yr ago partial hysterectomy  . Cervical disc disease   . Chronic kidney disease   . Collagen vascular disease ([place]    RA  3-4 yrs ago  . COPD (chronic obstructive pulmonary disease) ([place]   . Diabetes mellitus without complication ([place]   . GERD (gastroesophageal reflux disease)   . Hypertension   . Hypothyroidism   . Left upper quadrant pain 01/09/2014  . Major depressive disorder with single episode 12/05/2011  . Major depressive disorder, single episode 12/05/2011  . Migraines   . NASH (nonalcoholic steatohepatitis)   . Respiratory infection    2/17  . Shock ([place] 09/18/2014  . Sleep apnea   . Sleep apnea   . Syncope 11/16/2014  . Thyroid disease   . TIA (transient ischemic attack)    Family History  Problem Relation Age of Onset  . Lung cancer Mother   . Ulcers Father   . Emphysema Father   . Heart disease Sister   . Ulcers Sister   . Heart disease Brother    Past Surgical  History:  Procedure Laterality Date  . ABDOMINAL HYSTERECTOMY    . CHOLECYSTECTOMY N/A 07/15/2014   Procedure: LAPAROSCOPIC CHOLECYSTECTOMY with liver biopsy ;  Surgeon: MSherri Rad MD;  Location: [facility] ORS;  Service: General;  Laterality: N/A;  . COLONOSCOPY WITH PROPOFOL N/A 06/23/2014   Procedure: COLONOSCOPY WITH PROPOFOL;  Surgeon: MLollie Sails MD;  [facility];  Service: Endoscopy;  Laterality: N/A;  . ERCP N/A 07/16/2014   Procedure: ENDOSCOPIC RETROGRADE CHOLANGIOPANCREATOGRAPHY (ERCP);  Surgeon: MClarene Essex MD;  [facility];  Service: Endoscopy;  Laterality: N/A;  . ERCP N/A 10/03/2014   Procedure: ENDOSCOPIC RETROGRADE CHOLANGIOPANCREATOGRAPHY (ERCP);  Surgeon: PHulen Luster MD;  [facility];  Service: Gastroenterology;  Laterality: N/A;  . ESOPHAGOGASTRODUODENOSCOPY N/A 06/23/2014   Procedure: ESOPHAGOGASTRODUODENOSCOPY (EGD);  Surgeon: MLollie Sails MD;  [facility];  Service: Endoscopy;  Laterality: N/A;  . ESOPHAGOGASTRODUODENOSCOPY N/A 06/30/2016   Procedure: ESOPHAGOGASTRODUODENOSCOPY (EGD);  Surgeon: [name] MD;  [facility];  Service: Endoscopy;  Laterality: N/A;  . ESOPHAGOGASTRODUODENOSCOPY (EGD) WITH PROPOFOL N/A 12/01/2015   Procedure: ESOPHAGOGASTRODUODENOSCOPY (EGD) WITH PROPOFOL;  Surgeon: MLollie Sails MD;  [facility];  Service: Endoscopy;  Laterality: N/A;  . TUBAL LIGATION    . WISDOM TOOTH EXTRACTION     Patient Active Problem List   Diagnosis Date Noted  .  Acute postoperative pain 08/09/2016  . Low back pain due to L1-2 disc extrusion (caudal) (Left) 07/11/2016  . Abdominal pain 06/28/2016  . Constipation 06/20/2016  . Hyperglycemia 06/20/2016  . Nausea without vomiting 06/06/2016  . Chronic pain of lower extremity, bilateral 05/19/2016  . Spinal stenosis, thoracic region (T10-11) 05/19/2016  . Diabetes mellitus, insulin dependent (IDDM), uncontrolled ([place]) 04/12/2016  .  Chronic sacroiliac joint pain (Bilateral) (L>R) 03/23/2016  . Chronic pain of left upper extremity 03/10/2016  . Chronic radicular cervical pain (L) 03/10/2016  . Osteoarthritis 02/24/2016  . Allodynia 02/09/2016  . Chronic pain syndrome 01/07/2016  . Opiate withdrawal ([place]) 12/22/2015  . Elevated liver enzymes 09/28/2015  . Cirrhosis of liver without ascites ([place]) 09/24/2015  . Cirrhosis ([place]) 09/24/2015  . Altered mental status 07/24/2015  . Uncontrolled diabetes mellitus ([place]) 07/24/2015  . Radicular pain of thoracic region 04/06/2015  . Hepatic encephalopathy ([place]) 03/11/2015  . Elevated sedimentation rate 03/05/2015  . Elevated C-reactive protein (CRP) 03/05/2015  . Lumbar facet syndrome (Location of Primary Source of Pain) (Bilateral) (R>L) 03/05/2015  . Cervical spondylosis 03/05/2015  . Chronic feet pain (Location of Secondary source of pain) (Bilateral) (R>L) 03/05/2015  . Lumbar spondylosis 03/05/2015  . Encounter for chronic pain management 03/05/2015  . Chronic shoulder pain (Bilateral) (R>L) 03/05/2015  . Chronic carpal tunnel syndrome (Bilateral) 03/05/2015  . Chronic hip pain (Bilateral) (L>R) 03/05/2015  . Chronic upper back pain (Bilateral) (L>R) 03/05/2015  . Osteoporosis, idiopathic 03/05/2015  . Abnormal MRI, lumbar spine (02/03/2015) 03/05/2015  . Thoracic radiculitis 02/05/2015  . Vitamin D deficiency 01/13/2015  . B12 deficiency 01/13/2015  . Folate deficiency 01/13/2015  . Subacute lumbar radiculopathy (left side) (S1 dermatome) 12/10/2014  . Drowsiness 11/16/2014  . Episode of syncope 11/16/2014  . Somnolence 11/16/2014  . Opiate use (75 MME/Day) 10/28/2014  . Long term prescription opiate use 10/28/2014  . Long term current use of opiate analgesic 10/28/2014  . Encounter for therapeutic drug level monitoring 10/28/2014  . Chronic epigastric abdominal pain 10/28/2014  . Chronic low back pain (Location of Primary Source of Pain) (Bilateral) (R>L)  10/28/2014  . Chronic neck pain (Location of Tertiary source of pain) (Bilateral) (R>L) 10/28/2014  . Ascites 09/05/2014  . NASH (nonalcoholic steatohepatitis) 09/05/2014  . Hypokalemia 09/05/2014  . Dysthymia 08/05/2014  . Other social stressor 08/05/2014  . Steatohepatitis 07/15/2014  . Type 2 diabetes mellitus ([place]) 07/12/2014  . COPD (chronic obstructive pulmonary disease) ([place]) 07/12/2014  . GERD (gastroesophageal reflux disease) 07/12/2014  . OSA on CPAP 07/12/2014  . Anxiety 07/12/2014  . 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 Lumbar and sacral osteoarthritis 03/26/2014  . Myofascial pain 03/26/2014  . Lumbar spinal stenosis 03/26/2014  . Lumbosacral spondylosis without myelopathy 03/26/2014  . 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 Spinal stenosis, lumbar region without neurogenic claudication (L1-2 and L4-5) 03/26/2014  . Spondylosis of lumbar region without myelopathy or radiculopathy 03/26/2014  . Breath shortness 01/09/2014  . Diastolic dysfunction 00/76/2263  . Airway hyperreactivity 08/06/2013  . Essential (primary) hypertension 08/06/2013  . Asthma 08/06/2013  . Clinical depression 12/05/2011      Prior to Admission medications   Medication Sig Start Date End Date Taking? Authorizing Provider  ALPRAZolam Duanne Moron) 1 MG tablet Take 1 mg by mouth 3 (three) times daily.     [provider]  Cholecalciferol (VITAMIN D-1000 MAX ST) 1000 units tablet Take 2,000 Units by mouth daily.     [provider]  citalopram (  CELEXA) 20 MG tablet Take 30 mg by mouth daily.  07/24/15   [provider]  diclofenac sodium (VOLTAREN) 1 % GEL Apply 4 g topically 4 (four) times daily. 04/12/16 10/09/16  Milinda Pointer, MD  dicyclomine (BENTYL) 10 MG capsule Take 10 mg by mouth 2 (two) times daily.     [provider]  fluticasone (FLONASE) 50 MCG/ACT nasal spray Place 1-2 sprays into both nostrils daily.     [provider]    Folate-B12-Intrinsic Factor (INTRINSI B12-FOLATE) 546-568-12 MCG-MCG-MG TABS Take 1 tablet by mouth daily. Reported on 04/06/2015    [provider]  furosemide (LASIX) 40 MG tablet Take 40 mg by mouth daily.     [provider]  gabapentin (NEURONTIN) 300 MG capsule Take 600 mg by mouth 4 (four) times daily as needed (for pain).     [provider]  hydrochlorothiazide (MICROZIDE) 12.5 MG capsule Take 12.5 mg by mouth as needed.    [provider]  insulin aspart (NOVOLOG) 100 UNIT/ML injection 30 units with meals 3 times daily plus sliding scale. 06/03/16   [provider]  Insulin Detemir (LEVEMIR FLEXPEN [employer]) Inject 50 Units into the skin 2 (two) times daily.     [provider]  ipratropium-albuterol (DUONEB) 0.5-2.5 (3) MG/3ML SOLN Take 3 mLs by nebulization every 4 (four) hours as needed.    [provider]  lactulose (CHRONULAC) 10 GM/15ML solution Take 60 g by mouth 2 (two) times daily.  03/13/15   [provider]  naloxone Karma Greaser) 2 MG/2ML injection Inject into the vein as needed.    [provider]  nystatin (MYCOSTATIN) powder Apply 1 g topically 3 (three) times daily as needed (for irritation).     [provider]  oxyCODONE-acetaminophen (PERCOCET) 5-325 MG tablet Take 1-2 tablets by mouth every 6 (six) hours as needed for severe pain. 08/09/16 08/19/16  Milinda Pointer, MD  potassium chloride SA (K-DUR,KLOR-CON) 20 MEQ tablet Take 10-20 mEq by mouth daily.     [provider]  promethazine (PHENERGAN) 12.5 MG tablet Take 12.5 mg by mouth every 6 (six) hours as needed for nausea or vomiting.     [provider]  rifaximin (XIFAXAN) 550 MG TABS tablet Take 550 mg by mouth 2 (two) times daily.     [provider]  rizatriptan (MAXALT) 10 MG tablet Take 10 mg by mouth as needed for migraine.     [provider]  spironolactone (ALDACTONE) 100 MG tablet Take 100 mg by  mouth daily.     [provider]  vitamin B-12 (CYANOCOBALAMIN) 1000 MCG tablet Take 2,000 mcg by mouth daily.    [provider]    Allergies Tape and Vicodin [hydrocodone-acetaminophen]    Social History Social History  Substance Use Topics  . Smoking status: Never Smoker  . Smokeless tobacco: Never Used  . Alcohol use No     Comment: occ    Review of Systems Patient denies headaches, rhinorrhea, blurry vision, numbness, shortness of breath, chest pain, edema, cough, abdominal pain, nausea, vomiting, diarrhea, dysuria, fevers, rashes or hallucinations unless otherwise stated above in HPI. ____________________________________________   PHYSICAL EXAM:  VITAL SIGNS: Vitals:   09/23/16 1359  BP: 117/83  Pulse: (!) 111  Resp: 18  Temp: 98.3 F (36.8 C)  SpO2: 96%    Constitutional: Alert anxious  appearing in no acute distress. Eyes: Conjunctivae are normal.  Head: Atraumatic. Nose: No congestion/rhinnorhea. Mouth/Throat: Mucous membranes are moist.  Neck: No stridor. Painless ROM.  Cardiovascular: Normal rate, regular rhythm. Grossly normal heart sounds.  Good peripheral circulation. Respiratory: Normal respiratory effort.  No retractions. Lungs CTAB. Gastrointestinal: Soft, distended abdomen + ruq ttp, no guarding.. No abdominal bruits. No CVA tenderness. Genitourinary:  Musculoskeletal: No lower extremity tenderness nor edema.  No joint effusions. Neurologic:  + liver flap. Normal speech and language. No gross focal neurologic deficits are appreciated. No facial droop Skin:  Skin is warm, dry and intact. No rash noted. Psychiatric: anxious  ____________________________________________   LABS (all labs ordered are listed, but only abnormal results are displayed)  Results for orders placed or performed during the hospital encounter of 09/23/16 (from the past 24 hour(s))  Comprehensive metabolic panel     Status: Abnormal   Collection Time:  09/23/16  2:06 PM  Result Value Ref Range   Sodium 140 135 - 145 mmol/L   Potassium 3.7 3.5 - 5.1 mmol/L   Chloride 106 101 - 111 mmol/L   CO2 25 22 - 32 mmol/L   Glucose, Bld 297 (H) 65 - 99 mg/dL   BUN 8 6 - 20 mg/dL   Creatinine, Ser 0.57 0.44 - 1.00 mg/dL   Calcium 8.9 8.9 - 10.3 mg/dL   Total Protein 7.2 6.5 - 8.1 g/dL   Albumin 3.9 3.5 - 5.0 g/dL   AST 142 (H) 15 - 41 U/L   ALT 86 (H) 14 - 54 U/L   Alkaline Phosphatase 81 38 - 126 U/L   Total Bilirubin 1.6 (H) 0.3 - 1.2 mg/dL   GFR calc non Af Amer >60 >60 mL/min   GFR calc Af Amer >60 >60 mL/min   Anion gap 9 5 - 15  CBC     Status: Abnormal   Collection Time: 09/23/16  2:06 PM  Result Value Ref Range   WBC 4.5 3.6 - 11.0 K/uL   RBC 4.27 3.80 - 5.20 MIL/uL   Hemoglobin 15.3 12.0 - 16.0 g/dL   HCT 43.5 35.0 - 47.0 %   MCV 101.9 (H) 80.0 - 100.0 fL   MCH 35.9 (H) 26.0 - 34.0 pg   MCHC 35.2 32.0 - 36.0 g/dL   RDW 13.5 11.5 - 14.5 %   Platelets 90 (L) 150 - 440 K/uL  Ammonia     Status: Abnormal   Collection Time: 09/23/16  2:06 PM  Result Value Ref Range   Ammonia 86 (H) 9 - 35 umol/L  Lipase, blood     Status: None   Collection Time: 09/23/16  2:06 PM  Result Value Ref Range   Lipase 29 11 - 51 U/L  Glucose, capillary     Status: Abnormal   Collection Time: 09/23/16  2:08 PM  Result Value Ref Range   Glucose-Capillary 280 (H) 65 - 99 mg/dL  Urinalysis, Complete w Microscopic     Status: Abnormal   Collection Time: 09/23/16  2:29 PM  Result Value Ref Range   Color, Urine AMBER (A) YELLOW   APPearance HAZY (A) CLEAR   Specific Gravity, Urine 1.028 1.005 - 1.030   pH 5.0 5.0 - 8.0   Glucose, UA >=500 (A) NEGATIVE mg/dL   Hgb urine dipstick NEGATIVE NEGATIVE   Bilirubin Urine NEGATIVE NEGATIVE   Ketones, ur 5 (A) NEGATIVE mg/dL   Protein, ur 30 (A) NEGATIVE mg/dL   Nitrite NEGATIVE NEGATIVE   Leukocytes, UA NEGATIVE NEGATIVE   RBC / HPF 0-5 0 - 5 RBC/hpf   WBC, UA 0-5 0 - 5  WBC/hpf   Bacteria, UA RARE (A)  NONE SEEN   Squamous Epithelial / LPF 6-30 (A) NONE SEEN   Mucus PRESENT    ____________________________________________  EKG My review and personal interpretation at Time: 14:18   Indication: confusion  Rate: 105  Rhythm: sinus Axis: normal Other: normal intervals, no stemi, depression ____________________________________________  RADIOLOGY  I personally reviewed all radiographic images ordered to evaluate for the above acute complaints and reviewed radiology reports and findings.  These findings were personally discussed with the patient.  Please see medical record for radiology report.  ____________________________________________   PROCEDURES  Procedure(s) performed:  Procedures    Critical Care performed: yes CRITICAL CARE Performed by: Merlyn Lot   Total critical care time: 30 minutes  Critical care time was exclusive of separately billable procedures and treating other patients.  Critical care was necessary to treat or prevent imminent or life-threatening deterioration.  Critical care was time spent personally by me on the following activities: development of treatment plan with patient and/or surrogate as well as nursing, discussions with consultants, evaluation of patient's response to treatment, examination of patient, obtaining history from patient or surrogate, ordering and performing treatments and interventions, ordering and review of laboratory studies, ordering and review of radiographic studies, pulse oximetry and re-evaluation of patient's condition.  ____________________________________________   INITIAL IMPRESSION / ASSESSMENT AND PLAN / ED COURSE  Pertinent labs & imaging results that were available during my care of the patient were reviewed by me and considered in my medical decision making (see chart for details).  DDX: hepatic encephalopathy, dehydration, sbo, uti, sbp  Lori Mckenzie is a 51 y.o. who presents to the ED with Presents with  confusion and right upper quadrant pain. Patient with complex past medical history including-cirrhosis. No evidence of trauma. Has mild tenderness in right upper quadrant for which CT imaging was ordered. No evidence of acute abnormality. Blood work does show evidence of elevated ammonia level despite her taking the lactulose or at least reporting that she is taking her lactulose at home. CT head shows no acute intracranial abnormalities. EKG shows no ischemia. No evidence of SBP is no ascites. She is afebrile and has no evidence of sepsis. I am concerned for hepatic encephalopathy and spoke with the hospitalist group. Currently agreed to admit patient for further evaluation and management.  Have discussed with the patient and available family all diagnostics and treatments performed thus far and all questions were answered to the best of my ability. The patient demonstrates understanding and agreement with plan.       ____________________________________________   FINAL CLINICAL IMPRESSION(S) / ED DIAGNOSES  Final diagnoses:  Hepatic encephalopathy ([place])  RUQ abdominal pain  Liver cirrhosis secondary to NASH ([facility])      NEW MEDICATIONS STARTED DURING THIS VISIT:  New Prescriptions   No medications on file     Note:  This document was prepared using Dragon voice recognition software and may include unintentional dictation errors.    Merlyn Lot, MD 09/23/16 [id number]

## 2016-09-23 NOTE — H&P (Signed)
[facility] NAME: Lori Mckenzie    MR#:  [id number]  DATE OF BIRTH:  12-11-1965  DATE OF ADMISSION:  09/23/2016  PRIMARY CARE PHYSICIAN: Ashkin, Neldon Labella, MD   REQUESTING/REFERRING PHYSICIAN: Merlyn Lot, MD  CHIEF COMPLAINT:   Chief Complaint  Patient presents with  . Altered Mental Status  . Emesis  . Headache   Confusion today. HISTORY OF PRESENT ILLNESS:  Lori Mckenzie  is a 51 y.o. female with a known history of Liver cirrhosis, recurrent ascites, CK D, COPD, hypertension and diabetes, etc. The patient was sent to the ED due to confusion today. According to her husband, the patient was found confused today. The patient complains of right quadrant pain, dizziness and headache. She has been taking lactulose but no diarrhea for the past 3 days. Her vitamin D level is elevated at 85. The patient also complains of shortness breath and weight gain.  PAST MEDICAL HISTORY:   Past Medical History:  Diagnosis Date  . Abdominal abscess 08/25/2014  . Acid reflux 08/10/2010  . Acute cervical myofascial strain 02/09/2016  . Anxiety   . Ascites   . Asthma   . Back pain   . Bile leak, postoperative 07/17/2014  . Brittle bone disease   . Cancer ([place])    Uteriine  ca 33yr ago partial hysterectomy  . Cervical disc disease   . Chronic kidney disease   . Collagen vascular disease ([place]    RA  3-4 yrs ago  . COPD (chronic obstructive pulmonary disease) [place]   . Diabetes mellitus without complication ([place]   . GERD (gastroesophageal reflux disease)   . Hypertension   . Hypothyroidism   . Left upper quadrant pain 01/09/2014  . Major depressive disorder with single episode 12/05/2011  . Major depressive disorder, single episode 12/05/2011  . Migraines   . NASH (nonalcoholic steatohepatitis)   . Respiratory infection    2/17  . [place] 09/18/2014  . Sleep apnea   . Sleep apnea   . Syncope 11/16/2014  . Thyroid disease   . TIA  (transient ischemic attack)     PAST SURGICAL HISTORY:   Past Surgical History:  Procedure Laterality Date  . ABDOMINAL HYSTERECTOMY    . CHOLECYSTECTOMY N/A 07/15/2014   Procedure: LAPAROSCOPIC CHOLECYSTECTOMY with liver biopsy ;  Surgeon: MSherri Rad MD;  Location: [facility] ORS;  Service: General;  Laterality: N/A;  . COLONOSCOPY WITH PROPOFOL N/A 06/23/2014   Procedure: COLONOSCOPY WITH PROPOFOL;  Surgeon: MLollie Sails MD;  [facility];  Service: Endoscopy;  Laterality: N/A;  . ERCP N/A 07/16/2014   Procedure: ENDOSCOPIC RETROGRADE CHOLANGIOPANCREATOGRAPHY (ERCP);  Surgeon: MClarene Essex MD;  [facility];  Service: Endoscopy;  Laterality: N/A;  . ERCP N/A 10/03/2014   Procedure: ENDOSCOPIC RETROGRADE CHOLANGIOPANCREATOGRAPHY (ERCP);  Surgeon: PHulen Luster MD;  [facility];  Service: Gastroenterology;  Laterality: N/A;  . ESOPHAGOGASTRODUODENOSCOPY N/A 06/23/2014   Procedure: ESOPHAGOGASTRODUODENOSCOPY (EGD);  Surgeon: MLollie Sails MD;  [facility];  Service: Endoscopy;  Laterality: N/A;  . ESOPHAGOGASTRODUODENOSCOPY N/A 06/30/2016   Procedure: ESOPHAGOGASTRODUODENOSCOPY (EGD);  Surgeon: [name] MD;  [facility];  Service: Endoscopy;  Laterality: N/A;  . ESOPHAGOGASTRODUODENOSCOPY (EGD) WITH PROPOFOL N/A 12/01/2015   Procedure: ESOPHAGOGASTRODUODENOSCOPY (EGD) WITH PROPOFOL;  Surgeon: MLollie Sails MD;  [facility];  Service: Endoscopy;  Laterality: N/A;  . TUBAL LIGATION    . WISDOM TOOTH EXTRACTION  SOCIAL HISTORY:   Social History  Substance Use Topics  . Smoking status: Never Smoker  . Smokeless tobacco: Never Used  . Alcohol use No     Comment: occ    FAMILY HISTORY:   Family History  Problem Relation Age of Onset  . Lung cancer Mother   . Ulcers Father   . Emphysema Father   . Heart disease Sister   . Ulcers Sister   . Heart disease Brother     DRUG ALLERGIES:   Allergies    Allergen Reactions  . Tape Swelling  . Vicodin [Hydrocodone-Acetaminophen] Itching and Rash    REVIEW OF SYSTEMS:   Review of Systems  Constitutional: Positive for chills and malaise/fatigue. Negative for fever.       Weight gain  HENT: Negative for sore throat.   Eyes: Negative for blurred vision and double vision.  Respiratory: Negative for cough, hemoptysis, shortness of breath, wheezing and stridor.   Cardiovascular: Negative for chest pain, palpitations, orthopnea and leg swelling.  Gastrointestinal: Positive for abdominal pain. Negative for blood in stool, diarrhea, melena, nausea and vomiting.  Genitourinary: Negative for dysuria, flank pain and hematuria.  Musculoskeletal: Negative for back pain and joint pain.  Skin: Negative for rash.  Neurological: Negative for dizziness, sensory change, focal weakness, seizures, loss of consciousness, weakness and headaches.  Endo/Heme/Allergies: Negative for polydipsia.  Psychiatric/Behavioral: Negative for depression. The patient is nervous/anxious.     MEDICATIONS AT HOME:   Prior to Admission medications   Medication Sig Start Date End Date Taking? Authorizing Provider  albuterol (PROVENTIL HFA;VENTOLIN HFA) 108 (90 Base) MCG/ACT inhaler Inhale 2 puffs into the lungs every 6 (six) hours as needed for wheezing or shortness of breath.   Yes [provider]  ALPRAZolam Duanne Moron) 1 MG tablet Take 1 mg by mouth 3 (three) times daily.    Yes [provider]  cholecalciferol (VITAMIN D) 1000 units tablet Take 2,000 Units by mouth daily.   Yes [provider]  citalopram (CELEXA) 20 MG tablet Take 30 mg by mouth daily.  07/24/15  Yes [provider]  diclofenac sodium (VOLTAREN) 1 % GEL Apply 4 g topically 4 (four) times daily. 04/12/16 10/09/16 Yes Milinda Pointer, MD  dicyclomine (BENTYL) 10 MG capsule Take 10 mg by mouth 2 (two) times daily.    Yes [provider]  fluticasone (FLONASE) 50  MCG/ACT nasal spray Place 1-2 sprays into both nostrils daily.    Yes [provider]  Folate-B12-Intrinsic Factor (INTRINSI B12-FOLATE) 191-478-29 MCG-MCG-MG TABS Take 1 tablet by mouth daily. Reported on 04/06/2015   Yes [provider]  furosemide (LASIX) 40 MG tablet Take 40 mg by mouth daily.    Yes [provider]  gabapentin (NEURONTIN) 300 MG capsule Take 600 mg by mouth 4 (four) times daily.    Yes [provider]  hydrochlorothiazide (MICROZIDE) 12.5 MG capsule Take 12.5 mg by mouth daily as needed. For high blood pressure   Yes [provider]  insulin aspart (NOVOLOG) 100 UNIT/ML injection Inject 30-40 Units into the skin 3 (three) times daily before meals.   Yes [provider]  insulin detemir (LEVEMIR) 100 UNIT/ML injection Inject 50 Units into the skin 2 (two) times daily.   Yes [provider]  ipratropium-albuterol (DUONEB) 0.5-2.5 (3) MG/3ML SOLN Take 3 mLs by nebulization every 4 (four) hours as needed. For shortness of breath/wheezing   Yes [provider]  lactulose (CHRONULAC) 10 GM/15ML solution Take 10 g by  mouth 4 (four) times daily.    Yes [provider]  nystatin (MYCOSTATIN) powder Apply 1 g topically 3 (three) times daily as needed. For irritation   Yes [provider]  pantoprazole (PROTONIX) 40 MG tablet Take 40 mg by mouth 2 (two) times daily before a meal.   Yes [provider]  potassium chloride SA (K-DUR,KLOR-CON) 20 MEQ tablet Take 10-20 mEq by mouth daily as needed. For high blood pressure   Yes [provider]  promethazine (PHENERGAN) 12.5 MG tablet Take 25 mg by mouth 2 (two) times daily.    Yes [provider]  rifaximin (XIFAXAN) 550 MG TABS tablet Take 550 mg by mouth 2 (two) times daily.    Yes [provider]  rizatriptan (MAXALT) 10 MG tablet Take 10 mg by mouth as needed for migraine.    Yes [provider]    spironolactone (ALDACTONE) 100 MG tablet Take 100 mg by mouth daily.    Yes [provider]  vitamin B-12 (CYANOCOBALAMIN) 1000 MCG tablet Take 2,000 mcg by mouth daily.   Yes [provider]      VITAL SIGNS:  Blood pressure 117/83, pulse (!) 111, temperature 98.3 F (36.8 C), temperature source Oral, resp. rate 18, SpO2 96 %.  PHYSICAL EXAMINATION:  Physical Exam  GENERAL:  51 y.o.-year-old patient lying in the bed with no acute distress. Morbid obesity. EYES: Pupils equal, round, reactive to light and accommodation. No scleral icterus. Extraocular muscles intact.  HEENT: Head atraumatic, normocephalic. Oropharynx and nasopharynx clear.  NECK:  Supple, no jugular venous distention. No thyroid enlargement, no tenderness.  LUNGS: Normal breath sounds bilaterally, no wheezing, rales,rhonchi or crepitation. No use of accessory muscles of respiration.  CARDIOVASCULAR: S1, S2 normal. No murmurs, rubs, or gallops.  ABDOMEN: Soft, diffuse tenderness, distended. Bowel sounds present. No organomegaly or mass.  EXTREMITIES: No pedal edema, cyanosis, or clubbing.  NEUROLOGIC: Cranial nerves II through XII are intact. Muscle strength 4/5 in all extremities. Sensation intact. Gait not checked.  PSYCHIATRIC: The patient is alert and oriented x 3.  SKIN: No obvious rash, lesion, or ulcer.   LABORATORY PANEL:   CBC  Recent Labs Lab 09/23/16 1406  WBC 4.5  HGB 15.3  HCT 43.5  PLT 90*   ------------------------------------------------------------------------------------------------------------------  Chemistries   Recent Labs Lab 09/23/16 1406  NA 140  K 3.7  CL 106  CO2 25  GLUCOSE 297*  BUN 8  CREATININE 0.57  CALCIUM 8.9  AST 142*  ALT 86*  ALKPHOS 81  BILITOT 1.6*   ------------------------------------------------------------------------------------------------------------------  Cardiac Enzymes No results for input(s): TROPONINI in the last 168  hours. ------------------------------------------------------------------------------------------------------------------  RADIOLOGY:  Ct Head Wo Contrast  Result Date: 09/23/2016 CLINICAL DATA:  Dizziness and headaches since last night. Altered mental status. Speech difficulty. EXAM: CT HEAD WITHOUT CONTRAST TECHNIQUE: Contiguous axial images were obtained from the base of the skull through the vertex without intravenous contrast. COMPARISON:  06/24/2015. FINDINGS: Brain: No evidence of acute infarction, hemorrhage, hydrocephalus, extra-axial collection or mass lesion/mass effect. Vascular: No hyperdense vessel or unexpected calcification. Skull: Normal. Negative for fracture or focal lesion. Sinuses/Orbits: Unremarkable. Other: Mild bilateral temporomandibular joint degenerative changes. IMPRESSION: No acute abnormality. Electronically Signed   By: Claudie Revering M.D.   On: 09/23/2016 16:13   Ct Abdomen Pelvis W Contrast  Result Date: 09/23/2016 CLINICAL DATA:  3-4 episodes of vomiting since last night. EXAM: CT ABDOMEN AND PELVIS WITH CONTRAST TECHNIQUE: Multidetector CT imaging of the abdomen and  pelvis was performed using the standard protocol following bolus administration of intravenous contrast. CONTRAST:  161m ISOVUE-300 IOPAMIDOL (ISOVUE-300) INJECTION 61% COMPARISON:  06/24/2015. FINDINGS: Lower chest: Minimal bibasilar atelectasis. Hepatobiliary: No significant change in multiple liver cysts. Increased lobulation of the liver contours with increased size of the lateral segment of the left lobe of the liver and the caudate lobe. Cholecystectomy clips. Air in the biliary tree is again demonstrated. Pancreas: Unremarkable. No pancreatic ductal dilatation or surrounding inflammatory changes. Spleen:  Enlarged spleen with an interval increase in size. Adrenals/Urinary Tract: Adrenal glands are unremarkable. Kidneys are normal, without renal calculi, focal lesion, or hydronephrosis. Bladder is  unremarkable. Stomach/Bowel: Scattered colonic diverticulum. No evidence of appendicitis. Unremarkable stomach and small bowel. Vascular/Lymphatic: Small amount of aortic calcification. No aneurysm or enlarged lymph nodes. Previously demonstrated embolization coils in the left inferior epigastric artery. Prominent, tortuous vessels in the upper abdomen. Reproductive: Surgically absent uterus.  Normal appearing ovaries. Other: Small infraumbilical hernia containing herniated fat. Musculoskeletal: Lumbar and lower thoracic spine degenerative changes. IMPRESSION: 1. No acute abnormality. 2. Changes of cirrhosis of the liver with portal venous hypertension and associated splenomegaly. 3. Air in the biliary tree compatible with previous sphincterotomy. 4. Mild colonic diverticulosis. Electronically Signed   By: SClaudie ReveringM.D.   On: 09/23/2016 16:24      IMPRESSION AND PLAN:   Acute hepatitic encephalopathy. The patient will be admitted to medical floor. Increase lactulose to 20 g 4 times a day. Follow-up ammonia level. Aspiration and fall precaution.  Liver cirrhosis, continue rifaximin, Lasix, spironolactone and lactulose. Abnormal liver function test. Due to liver cirrhosis. Thrombocytopenia. Follow-up CBC. Hypertension. Continue hypertension medication Diabetes, continue Lantus, NovoLog before meals female and start sliding scale.  All the records are reviewed and case discussed with ED provider. Management plans discussed with the patient, her husband and they are in agreement.  CODE STATUS: Full code  TOTAL TIME TAKING CARE OF THIS PATIENT: 55 minutes.    CDemetrios LollM.D on 09/23/2016 at 6:20 PM  Between 7am to 6pm - Pager - [phone]  After 6pm go to [email or web address] - [email or web address] [facility]  Office  [phone] CC: Primary care physician; Ashkin, ENeldon Labella MD   Note: This dictation was prepared with Dragon dictation along with smaller phrase  technology. Any transcriptional errors that result from this process are unin

## 2016-09-24 DIAGNOSIS — K7290 Hepatic failure, unspecified without coma: Secondary | ICD-10-CM

## 2016-09-24 DIAGNOSIS — K5900 Constipation, unspecified: Secondary | ICD-10-CM

## 2016-09-24 LAB — BASIC METABOLIC PANEL
Anion gap: 9 (ref 5–15)
BUN: 8 mg/dL (ref 6–20)
CO2: 26 mmol/L (ref 22–32)
Calcium: 8.9 mg/dL (ref 8.9–10.3)
Chloride: 106 mmol/L (ref 101–111)
Creatinine, Ser: 0.58 mg/dL (ref 0.44–1.00)
GFR calc Af Amer: >60 mL/min (ref >60)
GFR calc non Af Amer: >60 mL/min (ref >60)
Glucose, Bld: 234 mg/dL — ABNORMAL HIGH (ref 65–99)
Potassium: 3.6 mmol/L (ref 3.5–5.1)
Sodium: 141 mmol/L (ref 135–145)

## 2016-09-24 LAB — GLUCOSE, CAPILLARY
Glucose-Capillary: 211 mg/dL — ABNORMAL HIGH (ref 65–99)
Glucose-Capillary: 225 mg/dL — ABNORMAL HIGH (ref 65–99)
Glucose-Capillary: 160 mg/dL — ABNORMAL HIGH (ref 65–99)
Glucose-Capillary: 185 mg/dL — ABNORMAL HIGH (ref 65–99)

## 2016-09-24 LAB — AMMONIA: Ammonia: 40 umol/L — ABNORMAL HIGH (ref 9–35)

## 2016-09-24 MED ORDER — LACTULOSE ENEMA
300.0000 mL | Freq: Once | ORAL | Status: DC
  Filled 2016-09-24: qty 300

## 2016-09-24 MED ORDER — MORPHINE SULFATE (PF) 2 MG/ML IV SOLN
2.0000 mg | INTRAVENOUS | Status: DC | PRN
  Administered 2016-09-24 – 2016-09-25 (×5): 2 mg via INTRAVENOUS
  Filled 2016-09-24 (×5): qty 1

## 2016-09-24 MED ORDER — PEG 3350-KCL-NA BICARB-NACL 420 G PO SOLR
4000.0000 mL | Freq: Once | ORAL | Status: AC
  Administered 2016-09-24: 4000 mL via ORAL
  Filled 2016-09-24: qty 4000

## 2016-09-24 NOTE — Progress Notes (Signed)
[place] [facility]                                                                                                                                                                                  Patient Demographics   Lori Mckenzie, is a 51 y.o. female, DOB - [birth year], BSW:[id number]  Admit date - 09/23/2016   Admitting Physician Demetrios Loll, MD  Outpatient Primary MD for the patient is Ashkin, Neldon Labella, MD   LOS - 1  Subjective: Patient admitted with acute hepatic encephalopathy states that she still confused Ammonia levels improved to 40 Complains of right sided abdominal pain severe CT done yesterday shows no acute abnormality    Review of Systems:   CONSTITUTIONAL: No documented fever. No fatigue, weakness. No weight gain, no weight loss.  EYES: No blurry or double vision.  ENT: No tinnitus. No postnasal drip. No redness of the oropharynx.  RESPIRATORY: No cough, no wheeze, no hemoptysis. No dyspnea.  CARDIOVASCULAR: No chest pain. No orthopnea. No palpitations. No syncope.  GASTROINTESTINAL: No nausea, no vomiting or diarrhea.positive abdominal pain. No melena or hematochezia.  GENITOURINARY: No dysuria or hematuria.  ENDOCRINE: No polyuria or nocturia. No heat or cold intolerance.  HEMATOLOGY: No anemia. No bruising. No bleeding.  INTEGUMENTARY: No rashes. No lesions.  MUSCULOSKELETAL: No arthritis. No swelling. No gout.  NEUROLOGIC: No numbness, tingling, or ataxia. No seizure-type activity.  PSYCHIATRIC: No anxiety. No insomnia. No ADD.    Vitals:   Vitals:   09/23/16 1359 09/23/16 1919 09/24/16 0510 09/24/16 0802  BP: 117/83 (!) 104/49 (!) 96/42 112/74  Pulse: (!) 111 84 88 92  Resp: 18 16 20 19   Temp: 98.3 F (36.8 C) 98.8 F (37.1 C) 98.8 F (37.1 C) 98.3 F (36.8 C)  TempSrc: Oral Oral Oral Oral  SpO2: 96% 93% 94% 93%  Weight:  268 lb 8 oz (121.8 kg)    Height:  5' 2"  (1.575 m)      Wt Readings from Last 3 Encounters:   09/23/16 268 lb 8 oz (121.8 kg)  09/02/16 260 lb (117.9 kg)  08/09/16 260 lb (117.9 kg)     Intake/Output Summary (Last 24 hours) at 09/24/16 1435 Last data filed at 09/24/16 0900  Gross per 24 hour  Intake              480 ml  Output              400 ml  Net               80 ml    Physical Exam:   GENERAL: Pleasant-appearing in no apparent distress.  HEAD, EYES, EARS, NOSE AND THROAT: Atraumatic, normocephalic. Extraocular muscles are intact. Pupils equal and reactive to light. Sclerae anicteric. No conjunctival injection. No oro-pharyngeal erythema.  NECK: Supple. There is no jugular venous distention. No bruits, no lymphadenopathy, no thyromegaly.  HEART: Regular rate and rhythm,. No murmurs, no rubs, no clicks.  LUNGS: Clear to auscultation bilaterally. No rales or rhonchi. No wheezes.  ABDOMEN: Soft, flat, positive right upper quadrant tenderness, nondistended. Has good bowel sounds. No hepatosplenomegaly appreciated.  EXTREMITIES: No evidence of any cyanosis, clubbing, or peripheral edema.  +2 pedal and radial pulses bilaterally.  NEUROLOGIC: The patient is alert, awake, and oriented x3 with no focal motor or sensory deficits appreciated bilaterally.  SKIN: Moist and warm with no rashes appreciated.  Psych: Not anxious, depressed LN: No inguinal LN enlargement    Antibiotics   Anti-infectives    Start     Dose/Rate Route Frequency Ordered Stop   09/23/16 2200  rifaximin (XIFAXAN) tablet 550 mg     550 mg Oral 2 times daily 09/23/16 1915        Medications   Scheduled Meds: . ALPRAZolam  1 mg Oral TID  . citalopram  30 mg Oral Daily  . dicyclomine  10 mg Oral BID  . enoxaparin (LOVENOX) injection  40 mg Subcutaneous BID  . ferrous [id number] C-folic acid  1 capsule Oral Daily  . fluticasone  1-2 spray Each Nare Daily  . furosemide  40 mg Oral Daily  . gabapentin  600 mg Oral QID  . insulin aspart  0-5 Units Subcutaneous QHS  . insulin aspart  0-9  Units Subcutaneous TID WC  . insulin aspart  30 Units Subcutaneous TID AC  . insulin detemir  50 Units Subcutaneous BID  . lactulose  20 g Oral QID  . lactulose  300 mL Rectal Once  . pantoprazole  40 mg Oral BID AC  . promethazine  25 mg Oral BID  . rifaximin  550 mg Oral BID  . sodium chloride flush  3 mL Intravenous Q12H  . spironolactone  100 mg Oral Daily  . vitamin B-12  2,000 mcg Oral Daily   Continuous Infusions: . sodium chloride     PRN Meds:.sodium chloride, albuterol, bisacodyl, ibuprofen, ketorolac, morphine injection, ondansetron **OR** ondansetron (ZOFRAN) IV, senna-docusate, sodium chloride flush   Data Review:   Micro Results No results found for this or any previous visit (from the past 240 hour(s)).  Radiology Reports Dg Chest 2 View  Result Date: 09/02/2016 CLINICAL DATA:  Initial evaluation for acute shortness of breath. EXAM: CHEST  2 VIEW COMPARISON:  Prior radiograph from 07/12/2016. FINDINGS: Mild cardiomegaly, stable from previous. Mediastinal silhouette within normal limits. Lungs mildly hypoinflated. Mild perihilar vascular and interstitial congestion without pulmonary edema. No focal infiltrates. No pleural effusion. No pneumothorax. No acute osseous abnormality. IMPRESSION: Stable cardiomegaly with mild perihilar and interstitial vascular congestion without frank pulmonary edema. Electronically Signed   By: Jeannine Boga M.D.   On: 09/02/2016 15:43   Ct Head Wo Contrast  Result Date: 09/23/2016 CLINICAL DATA:  Dizziness and headaches since last night. Altered mental status. Speech difficulty. EXAM: CT HEAD WITHOUT CONTRAST TECHNIQUE: Contiguous axial images were obtained from the base of the skull through the vertex without intravenous contrast. COMPARISON:  06/24/2015. FINDINGS: Brain: No evidence of acute infarction, hemorrhage, hydrocephalus, extra-axial collection or mass lesion/mass effect. Vascular: No hyperdense vessel or unexpected  calcification. Skull: Normal. Negative for fracture or focal lesion. Sinuses/Orbits: Unremarkable. Other: Mild bilateral  temporomandibular joint degenerative changes. IMPRESSION: No acute abnormality. Electronically Signed   By: Claudie Revering M.D.   On: 09/23/2016 16:13   Ct Lumbar Spine Wo Contrast  Result Date: 09/02/2016 CLINICAL DATA:  Initial evaluation for chronic back pain, recent spine injection. EXAM: CT LUMBAR SPINE WITHOUT CONTRAST TECHNIQUE: Multidetector CT imaging of the lumbar spine was performed without intravenous contrast administration. Multiplanar CT image reconstructions were also generated. COMPARISON:  Prior MRI from 02/03/2015. FINDINGS: Segmentation: Normal segmentation. Lowest well-formed disc labeled the L5-S1 level. Alignment: Trace levoscoliosis with apex at L4-5. Mild straightening of the normal lumbar lordosis. Trace anterolisthesis of L4 on L5. No malalignment. Vertebrae: Vertebral body heights maintained. No evidence for acute or chronic fracture. Few scattered degenerative endplate Schmorl's nodes noted, most notable about the L2-3 interspace. No discrete osseous lesions. Visualized sacrum intact. SI joints approximated and symmetric. Paraspinal and other soft tissues: Paraspinous soft tissues demonstrate no acute abnormality. Mild atherosclerotic plaque within the infrarenal aorta. Visualized visceral structures otherwise unremarkable. Disc levels: T11-12: Shallow posterior osseous ridging mildly indents the ventral thecal sac. No canal or foraminal stenosis. T12-L1: Right paracentral disc protrusion with associated spurring mildly indents the ventral thecal sac. No significant stenosis. This is stable from previous. L1-2: Small left paracentral disc protrusion indents the ventral thecal sac. Mild facet arthrosis. No significant canal or foraminal stenosis. This is stable from previous. L2-3: Minimal disc bulge with mild bilateral facet arthropathy. No stenosis. L3-4: Minimal  annular disc bulge. Moderate bilateral facet arthrosis. No significant stenosis. L4-5: Trace anterolisthesis. Minimal annular disc bulge. Moderate bilateral facet arthrosis. Resultant mild to moderate spinal stenosis, unchanged. No significant foraminal encroachment. L5-S1:  Mild disc bulge and facet hypertrophy.  No stenosis. Overall appearance of the degenerative changes are not significantly changed relative to previous MRI. No complication identified status post recent injection. IMPRESSION: 1. No acute abnormality within the lumbar spine. No complication identified status post recent injection. 2. Multilevel disc degeneration with facet arthropathy as above, most notable at L4-5. Overall, appearance is not significantly changed relative to most recent MRI from 02/03/2015. Electronically Signed   By: Jeannine Boga M.D.   On: 09/02/2016 19:51   Ct Abdomen Pelvis W Contrast  Result Date: 09/23/2016 CLINICAL DATA:  3-4 episodes of vomiting since last night. EXAM: CT ABDOMEN AND PELVIS WITH CONTRAST TECHNIQUE: Multidetector CT imaging of the abdomen and pelvis was performed using the standard protocol following bolus administration of intravenous contrast. CONTRAST:  172m ISOVUE-300 IOPAMIDOL (ISOVUE-300) INJECTION 61% COMPARISON:  06/24/2015. FINDINGS: Lower chest: Minimal bibasilar atelectasis. Hepatobiliary: No significant change in multiple liver cysts. Increased lobulation of the liver contours with increased size of the lateral segment of the left lobe of the liver and the caudate lobe. Cholecystectomy clips. Air in the biliary tree is again demonstrated. Pancreas: Unremarkable. No pancreatic ductal dilatation or surrounding inflammatory changes. Spleen:  Enlarged spleen with an interval increase in size. Adrenals/Urinary Tract: Adrenal glands are unremarkable. Kidneys are normal, without renal calculi, focal lesion, or hydronephrosis. Bladder is unremarkable. Stomach/Bowel: Scattered colonic  diverticulum. No evidence of appendicitis. Unremarkable stomach and small bowel. Vascular/Lymphatic: Small amount of aortic calcification. No aneurysm or enlarged lymph nodes. Previously demonstrated embolization coils in the left inferior epigastric artery. Prominent, tortuous vessels in the upper abdomen. Reproductive: Surgically absent uterus.  Normal appearing ovaries. Other: Small infraumbilical hernia containing herniated fat. Musculoskeletal: Lumbar and lower thoracic spine degenerative changes. IMPRESSION: 1. No acute abnormality. 2. Changes of cirrhosis of the liver with portal venous hypertension  and associated splenomegaly. 3. Air in the biliary tree compatible with previous sphincterotomy. 4. Mild colonic diverticulosis. Electronically Signed   By: Claudie Revering M.D.   On: 09/23/2016 16:24     CBC  Recent Labs Lab 09/23/16 1406  WBC 4.5  HGB 15.3  HCT 43.5  PLT 90*  MCV 101.9*  MCH 35.9*  MCHC 35.2  RDW 13.5    Chemistries   Recent Labs Lab 09/23/16 1406 09/24/16 0413  NA 140 141  K 3.7 3.6  CL 106 106  CO2 25 26  GLUCOSE 297* 234*  BUN 8 8  CREATININE 0.57 0.58  CALCIUM 8.9 8.9  MG 1.8  --   AST 142*  --   ALT 86*  --   ALKPHOS 81  --   BILITOT 1.6*  --    ------------------------------------------------------------------------------------------------------------------ estimated creatinine clearance is 101.2 mL/min (by C-G formula based on SCr of 0.58 mg/dL). ------------------------------------------------------------------------------------------------------------------ No results for input(s): HGBA1C in the last 72 hours. ------------------------------------------------------------------------------------------------------------------ No results for input(s): CHOL, HDL, LDLCALC, TRIG, CHOLHDL, LDLDIRECT in the last 72 hours. ------------------------------------------------------------------------------------------------------------------ No results for  input(s): TSH, T4TOTAL, T3FREE, THYROIDAB in the last 72 hours.  Invalid input(s): FREET3 ------------------------------------------------------------------------------------------------------------------ No results for input(s): VITAMINB12, FOLATE, FERRITIN, TIBC, IRON, RETICCTPCT in the last 72 hours.  Coagulation profile No results for input(s): INR, PROTIME in the last 168 hours.  No results for input(s): DDIMER in the last 72 hours.  Cardiac Enzymes No results for input(s): CKMB, TROPONINI, MYOGLOBIN in the last 168 hours.  Invalid input(s): CK ------------------------------------------------------------------------------------------------------------------ Invalid input(s): [place]  Patient is 51 year old presenting with confusion and abdominal pain  1.Acute hepatitic encephalopathy. Fleet enema given with lactulose patient's ammonia is decreased 2. Abdominal pain - gi has Being consultation  3. Liver cirrhosis, continue rifaximin, Lasix, spironolactone and lactulose. 4. Abnormal liver function test. Due to liver cirrhosis. 5. Thrombocytopenia. Follow-up CBC. 6. Hypertension. Continue hypertension medication 7. Diabetes, continue Lantus, NovoLog before meals female and start sliding scale.     Code Status Orders        Start     Ordered   09/23/16 1916  Full code  Continuous     09/23/16 1915    Code Status History    Date Active Date Inactive Code Status Order ID Comments User Context   06/28/2016  9:22 PM 06/30/2016  7:09 PM Full Code [id number]  Henreitta Leber, MD Inpatient   03/12/2015 12:09 AM 03/13/2015  5:23 PM Full Code [id number]  Gladstone Lighter, MD Inpatient   10/04/2014 12:59 PM 10/06/2014  8:20 PM Full Code [id number]  Baxter Hire, MD Inpatient   09/05/2014  2:29 AM 09/08/2014  6:10 PM Full Code [id number]  Lance Coon, MD Inpatient   08/25/2014  6:23 PM 08/30/2014  1:48 PM Full Code [id number]  Florene Glen, MD ED   08/03/2014   5:12 PM 08/08/2014  3:16 PM Full Code [id number]  Marlyce Huge, MD ED   07/24/2014  6:45 PM 07/30/2014  5:31 PM Full Code [id number]  Florene Glen, MD Inpatient   07/23/2014 11:42 AM 07/24/2014  3:27 AM Full Code [id number]  Sabino Dick, MD [place]   07/12/2014 11:25 PM 07/16/2014  4:30 PM Full Code [id number]  Lance Coon, MD Inpatient           Consults GI DVT Prophylaxis  SCDs  Lab Results  Component Value Date   PLT 90 (L) 09/23/2016     Time Spent in  minutes 35 minutes Greater than 50% of time spent in care coordination and counseling patient regarding the condition and plan of care.   Dustin Flock M.D on 09/24/2016 at 2:35 PM  Between 7am to 6pm - Pager - [phone]  After 6pm go to [email or web address] - password [email or web address] [facility]   Office  [phone]

## 2016-09-24 NOTE — Consult Note (Signed)
Jonathon Bellows MD, MRCP(U.K) [address]  [place]  [place], [place] [address]  Main: [phone]  Fax: [phone]  Consultation  Referring Provider:  Dr Bridgett Larsson Primary Care Physician:  [name] Neldon Labella, MD Primary Gastroenterologist:  [facility]     Reason for Consultation:  Abdominal pain   Date of Admission:  09/23/2016 Date of Consultation:  09/24/2016         HPI:   Lori Mckenzie is a 51 y.o. female who is known to the [facility] seen last 07/04/16 for abdominal pain , melena ,cirrhosis. EGD  06/2016 showed portal hypertensive gastropathy.COLONOSCOPY 06/23/2014 Hyperplastic colon polyp. Also known to suffer from constipation.   Ct abdomen 09/23/16- shows cirrhosis of the liver with portal HTN, splenomegaly .   She was sent to the ER yesterday for confusion , RUQ pain .  She says she has had issues with constipation , last bowel movement was a few days back , pain is all over the abdomen , better when she passes gas. She has had her gall bladder taken out some years back and they injured her bile ducts. Denies any confusion presently.    Past Medical History:  Diagnosis Date  . Abdominal abscess 08/25/2014  . Acid reflux 08/10/2010  . Acute cervical myofascial strain 02/09/2016  . Anxiety   . Ascites   . Asthma   . Back pain   . Bile leak, postoperative 07/17/2014  . Brittle bone disease   . Cancer ([place])    Uteriine  ca 46yr ago partial hysterectomy  . Cervical disc disease   . Chronic kidney disease   . Collagen vascular disease ([place]    RA  3-4 yrs ago  . COPD (chronic obstructive pulmonary disease) ([place]   . Diabetes mellitus without complication ([place]   . GERD (gastroesophageal reflux disease)   . Hypertension   . Hypothyroidism   . Left upper quadrant pain 01/09/2014  . Major depressive disorder with single episode 12/05/2011  . Major depressive disorder, single episode 12/05/2011  . Migraines   . NASH (nonalcoholic steatohepatitis)   . Respiratory infection    2/17  .  Shock ([place] 09/18/2014  . Sleep apnea   . Sleep apnea   . Syncope 11/16/2014  . Thyroid disease   . TIA (transient ischemic attack)     Past Surgical History:  Procedure Laterality Date  . ABDOMINAL HYSTERECTOMY    . CHOLECYSTECTOMY N/A 07/15/2014   Procedure: LAPAROSCOPIC CHOLECYSTECTOMY with liver biopsy ;  Surgeon: MSherri Rad MD;  Location: [facility] ORS;  Service: General;  Laterality: N/A;  . COLONOSCOPY WITH PROPOFOL N/A 06/23/2014   Procedure: COLONOSCOPY WITH PROPOFOL;  Surgeon: MLollie Sails MD;  [facility];  Service: Endoscopy;  Laterality: N/A;  . ERCP N/A 07/16/2014   Procedure: ENDOSCOPIC RETROGRADE CHOLANGIOPANCREATOGRAPHY (ERCP);  Surgeon: MClarene Essex MD;  [facility];  Service: Endoscopy;  Laterality: N/A;  . ERCP N/A 10/03/2014   Procedure: ENDOSCOPIC RETROGRADE CHOLANGIOPANCREATOGRAPHY (ERCP);  Surgeon: PHulen Luster MD;  [facility];  Service: Gastroenterology;  Laterality: N/A;  . ESOPHAGOGASTRODUODENOSCOPY N/A 06/23/2014   Procedure: ESOPHAGOGASTRODUODENOSCOPY (EGD);  Surgeon: MLollie Sails MD;  [facility];  Service: Endoscopy;  Laterality: N/A;  . ESOPHAGOGASTRODUODENOSCOPY N/A 06/30/2016   Procedure: ESOPHAGOGASTRODUODENOSCOPY (EGD);  Surgeon: [name] MD;  [facility];  Service: Endoscopy;  Laterality: N/A;  . ESOPHAGOGASTRODUODENOSCOPY (EGD) WITH PROPOFOL N/A 12/01/2015   Procedure: ESOPHAGOGASTRODUODENOSCOPY (EGD) WITH PROPOFOL;  Surgeon: MLollie Sails MD;  Location: [facility] ENDOSCOPY;  Service: Endoscopy;  Laterality: N/A;  . TUBAL LIGATION    . WISDOM TOOTH EXTRACTION      Prior to Admission medications   Medication Sig Start Date End Date Taking? Authorizing Provider  albuterol (PROVENTIL HFA;VENTOLIN HFA) 108 (90 Base) MCG/ACT inhaler Inhale 2 puffs into the lungs every 6 (six) hours as needed for wheezing or shortness of breath.   Yes [provider]  ALPRAZolam Duanne Moron) 1 MG  tablet Take 1 mg by mouth 3 (three) times daily.    Yes [provider]  cholecalciferol (VITAMIN D) 1000 units tablet Take 2,000 Units by mouth daily.   Yes [provider]  citalopram (CELEXA) 20 MG tablet Take 30 mg by mouth daily.  07/24/15  Yes [provider]  diclofenac sodium (VOLTAREN) 1 % GEL Apply 4 g topically 4 (four) times daily. 04/12/16 10/09/16 Yes Milinda Pointer, MD  dicyclomine (BENTYL) 10 MG capsule Take 10 mg by mouth 2 (two) times daily.    Yes [provider]  fluticasone (FLONASE) 50 MCG/ACT nasal spray Place 1-2 sprays into both nostrils daily.    Yes [provider]  Folate-B12-Intrinsic Factor (INTRINSI B12-FOLATE) [phone] MCG-MCG-MG TABS Take 1 tablet by mouth daily. Reported on 04/06/2015   Yes [provider]  furosemide (LASIX) 40 MG tablet Take 40 mg by mouth daily.    Yes [provider]  gabapentin (NEURONTIN) 300 MG capsule Take 600 mg by mouth 4 (four) times daily.    Yes [provider]  hydrochlorothiazide (MICROZIDE) 12.5 MG capsule Take 12.5 mg by mouth daily as needed. For high blood pressure   Yes [provider]  insulin aspart (NOVOLOG) 100 UNIT/ML injection Inject 30-40 Units into the skin 3 (three) times daily before meals.   Yes [provider]  insulin detemir (LEVEMIR) 100 UNIT/ML injection Inject 50 Units into the skin 2 (two) times daily.   Yes [provider]  ipratropium-albuterol (DUONEB) 0.5-2.5 (3) MG/3ML SOLN Take 3 mLs by nebulization every 4 (four) hours as needed. For shortness of breath/wheezing   Yes [provider]  lactulose (CHRONULAC) 10 GM/15ML solution Take 10 g by mouth 4 (four) times daily.    Yes [provider]  nystatin (MYCOSTATIN) powder Apply 1 g topically 3 (three) times daily as needed. For irritation   Yes [provider]  pantoprazole (PROTONIX) 40 MG tablet Take 40 mg by mouth 2 (two) times daily  before a meal.   Yes [provider]  potassium chloride SA (K-DUR,KLOR-CON) 20 MEQ tablet Take 10-20 mEq by mouth daily as needed. For high blood pressure   Yes [provider]  promethazine (PHENERGAN) 12.5 MG tablet Take 25 mg by mouth 2 (two) times daily.    Yes [provider]  rifaximin (XIFAXAN) 550 MG TABS tablet Take 550 mg by mouth 2 (two) times daily.    Yes [provider]  rizatriptan (MAXALT) 10 MG tablet Take 10 mg by mouth as needed for migraine.    Yes [provider]  spironolactone (ALDACTONE) 100 MG tablet Take 100 mg by mouth daily.    Yes [provider]  vitamin B-12 (CYANOCOBALAMIN) 1000 MCG tablet Take 2,000 mcg by mouth daily.   Yes [provider]    Family History  Problem Relation Age of Onset  . Lung cancer Mother   . Ulcers Father   . Emphysema Father   . Heart disease Sister   . Ulcers Sister   .  Heart disease Brother      Social History  Substance Use Topics  . Smoking status: Never Smoker  . Smokeless tobacco: Never Used  . Alcohol use No     Comment: occ    Allergies as of 09/23/2016 - Review Complete 09/23/2016  Allergen Reaction Noted  . Tape Swelling 10/03/2014  . Vicodin [hydrocodone-acetaminophen] Itching and Rash 01/07/2011    Review of Systems:    All systems reviewed and negative except where noted in HPI.   Physical Exam:  Vital signs in last 24 hours: Temp:  [98.3 F (36.8 C)-98.8 F (37.1 C)] 98.3 F (36.8 C) (09/22 0802) Pulse Rate:  [84-92] 92 (09/22 0802) Resp:  [16-20] 19 (09/22 0802) BP: (96-112)/(42-74) 112/74 (09/22 0802) SpO2:  [93 %-94 %] 93 % (09/22 0802) Weight:  [268 lb 8 oz (121.8 kg)] 268 lb 8 oz (121.8 kg) (09/21 1919) Last BM Date: 09/23/16 General:   Pleasant, cooperative in NAD Head:  Normocephalic and atraumatic. Eyes:   No icterus.   Conjunctiva pink. PERRLA. Ears:  Normal auditory acuity. Neck:  Supple; no masses or  thyroidomegaly Lungs: Respirations even and unlabored. Lungs clear to auscultation bilaterally.   No wheezes, crackles, or rhonchi.  Heart:  Regular rate and rhythm;  Without murmur, clicks, rubs or gallops Abdomen:  Soft, nondistended, nontender. Normal bowel sounds. No appreciable masses or hepatomegaly.  No rebound or guarding.  Extremities:  Without edema, cyanosis or clubbing. Neurologic:  Alert and oriented x3;  grossly normal neurologically. Skin:  Intact without significant lesions or rashes. Cervical Nodes:  No significant cervical adenopathy. Psych:  Alert and cooperative. Normal affect.  LAB RESULTS:  Recent Labs  09/23/16 1406  WBC 4.5  HGB 15.3  HCT 43.5  PLT 90*   BMET  Recent Labs  09/23/16 1406 09/24/16 0413  NA 140 141  K 3.7 3.6  CL 106 106  CO2 25 26  GLUCOSE 297* 234*  BUN 8 8  CREATININE 0.57 0.58  CALCIUM 8.9 8.9   LFT  Recent Labs  09/23/16 1406  PROT 7.2  ALBUMIN 3.9  AST 142*  ALT 86*  ALKPHOS 81  BILITOT 1.6*   PT/INR No results for input(s): LABPROT, INR in the last 72 hours.  STUDIES: Ct Head Wo Contrast  Result Date: 09/23/2016 CLINICAL DATA:  Dizziness and headaches since last night. Altered mental status. Speech difficulty. EXAM: CT HEAD WITHOUT CONTRAST TECHNIQUE: Contiguous axial images were obtained from the base of the skull through the vertex without intravenous contrast. COMPARISON:  06/24/2015. FINDINGS: Brain: No evidence of acute infarction, hemorrhage, hydrocephalus, extra-axial collection or mass lesion/mass effect. Vascular: No hyperdense vessel or unexpected calcification. Skull: Normal. Negative for fracture or focal lesion. Sinuses/Orbits: Unremarkable. Other: Mild bilateral temporomandibular joint degenerative changes. IMPRESSION: No acute abnormality. Electronically Signed   By: Claudie Revering M.D.   On: 09/23/2016 16:13   Ct Abdomen Pelvis W Contrast  Result Date: 09/23/2016 CLINICAL DATA:  3-4 episodes of  vomiting since last night. EXAM: CT ABDOMEN AND PELVIS WITH CONTRAST TECHNIQUE: Multidetector CT imaging of the abdomen and pelvis was performed using the standard protocol following bolus administration of intravenous contrast. CONTRAST:  173m ISOVUE-300 IOPAMIDOL (ISOVUE-300) INJECTION 61% COMPARISON:  06/24/2015. FINDINGS: Lower chest: Minimal bibasilar atelectasis. Hepatobiliary: No significant change in multiple liver cysts. Increased lobulation of the liver contours with increased size of the lateral segment of the left lobe of the liver and the caudate lobe. Cholecystectomy clips. Air in the biliary tree is  again demonstrated. Pancreas: Unremarkable. No pancreatic ductal dilatation or surrounding inflammatory changes. Spleen:  Enlarged spleen with an interval increase in size. Adrenals/Urinary Tract: Adrenal glands are unremarkable. Kidneys are normal, without renal calculi, focal lesion, or hydronephrosis. Bladder is unremarkable. Stomach/Bowel: Scattered colonic diverticulum. No evidence of appendicitis. Unremarkable stomach and small bowel. Vascular/Lymphatic: Small amount of aortic calcification. No aneurysm or enlarged lymph nodes. Previously demonstrated embolization coils in the left inferior epigastric artery. Prominent, tortuous vessels in the upper abdomen. Reproductive: Surgically absent uterus.  Normal appearing ovaries. Other: Small infraumbilical hernia containing herniated fat. Musculoskeletal: Lumbar and lower thoracic spine degenerative changes. IMPRESSION: 1. No acute abnormality. 2. Changes of cirrhosis of the liver with portal venous hypertension and associated splenomegaly. 3. Air in the biliary tree compatible with previous sphincterotomy. 4. Mild colonic diverticulosis. Electronically Signed   By: Claudie Revering M.D.   On: 09/23/2016 16:24      Impression / Plan:   Lori Mckenzie is a 51 y.o. y/o female with Cirrhosis, constipation come into the hospital with some confusion, abdominal  pain and not having a bowel movement in a few days    1. Acute hepatic encephelopathy - No evidence of GI bleed. Constipation likely cause of episode of encephelopathy,  limit narcotics, lactulose QID titrated to 2-3 soft bowel movements. Rifaxamin 545m BID. Correct hyperglycemia . Golytely to clean out colon   2. At discharge can stary on Linzess 290 mcg in addition to lactulose   Thank you for involving me in the care of this patient.      LOS: 1 day   KJonathon Bellows MD  09/24/2016, 2:11 PM

## 2016-09-24 NOTE — ED Notes (Signed)
70mg fentanyl wasted, witnessed by this RN.

## 2016-09-24 NOTE — ED Notes (Signed)
52mg fentanyl wasted. FStaropoli Witnessed

## 2016-09-25 DIAGNOSIS — K7200 Acute and subacute hepatic failure without coma: Principal | ICD-10-CM

## 2016-09-25 LAB — GLUCOSE, CAPILLARY
Glucose-Capillary: 174 mg/dL — ABNORMAL HIGH (ref 65–99)
Glucose-Capillary: 255 mg/dL — ABNORMAL HIGH (ref 65–99)

## 2016-09-25 LAB — AMMONIA: Ammonia: 58 umol/L — ABNORMAL HIGH (ref 9–35)

## 2016-09-25 MED ORDER — OXYCODONE HCL 5 MG PO CAPS: 5.0000 mg | ORAL_CAPSULE | Freq: Four times a day (QID) | ORAL | 0 refills | Status: DC | PRN

## 2016-09-25 MED ORDER — PROMETHAZINE HCL 12.5 MG PO TABS: 25.0000 mg | ORAL_TABLET | Freq: Four times a day (QID) | ORAL | 0 refills | Status: AC | PRN

## 2016-09-25 NOTE — Discharge Summary (Signed)
[facility] [place] [facility], [place] y.o., DOB Nov 19, 1965, MRN [id number]. Admission date: 09/23/2016 Discharge Date 09/25/2016 Primary MD Ashkin, Neldon Labella, MD Admitting Physician Demetrios Loll, MD  Admission Diagnosis  Hepatic encephalopathy [facility]) [K72.90] RUQ abdominal pain [R10.11] Liver cirrhosis secondary to NASH ([place]) [K75.81, K74.60]  Discharge Diagnosis   Active Problems:   Acute hepatic encephalopathy  abdominal pain of unclear etiology needs outpatient GI follow-up CT of the abdomen negative Liver cirrhosis Abnormal liver function tests Thrombocytopenia Essential hypertension Diabetes       Hospital Course  Patient is 51 year old female with known history of liver cirrhosis and recurrent ascites and chronic kidney disease, COPD, essential hypertension diabetes who presented to the hospital with abdominal pain and confusion. Patient's ammonia level was elevated so she was admitted for acute hepatic encephalopathy. Patient did not have any bowel movement so she had to receive laxatives. Along with her lactulose she was seen in consultation by GI. Patient also had a CT scan of the abdomen due to abdominal pain that showed no acute pathology. She is recommended to follow-up with her primary GI physician. Her mental status is back to baseline.           Consults  GI  Significant Tests:  See full reports for all details     Dg Chest 2 View  Result Date: 09/02/2016 CLINICAL DATA:  Initial evaluation for acute shortness of breath. EXAM: CHEST  2 VIEW COMPARISON:  Prior radiograph from 07/12/2016. FINDINGS: Mild cardiomegaly, stable from previous. Mediastinal silhouette within normal limits. Lungs mildly hypoinflated. Mild perihilar vascular and interstitial congestion without pulmonary edema. No focal infiltrates. No pleural effusion. No pneumothorax. No acute osseous abnormality. IMPRESSION: Stable cardiomegaly with mild perihilar and interstitial  vascular congestion without frank pulmonary edema. Electronically Signed   By: Jeannine Boga M.D.   On: 09/02/2016 15:43   Ct Head Wo Contrast  Result Date: 09/23/2016 CLINICAL DATA:  Dizziness and headaches since last night. Altered mental status. Speech difficulty. EXAM: CT HEAD WITHOUT CONTRAST TECHNIQUE: Contiguous axial images were obtained from the base of the skull through the vertex without intravenous contrast. COMPARISON:  06/24/2015. FINDINGS: Brain: No evidence of acute infarction, hemorrhage, hydrocephalus, extra-axial collection or mass lesion/mass effect. Vascular: No hyperdense vessel or unexpected calcification. Skull: Normal. Negative for fracture or focal lesion. Sinuses/Orbits: Unremarkable. Other: Mild bilateral temporomandibular joint degenerative changes. IMPRESSION: No acute abnormality. Electronically Signed   By: Claudie Revering M.D.   On: 09/23/2016 16:13   Ct Lumbar Spine Wo Contrast  Result Date: 09/02/2016 CLINICAL DATA:  Initial evaluation for chronic back pain, recent spine injection. EXAM: CT LUMBAR SPINE WITHOUT CONTRAST TECHNIQUE: Multidetector CT imaging of the lumbar spine was performed without intravenous contrast administration. Multiplanar CT image reconstructions were also generated. COMPARISON:  Prior MRI from 02/03/2015. FINDINGS: Segmentation: Normal segmentation. Lowest well-formed disc labeled the L5-S1 level. Alignment: Trace levoscoliosis with apex at L4-5. Mild straightening of the normal lumbar lordosis. Trace anterolisthesis of L4 on L5. No malalignment. Vertebrae: Vertebral body heights maintained. No evidence for acute or chronic fracture. Few scattered degenerative endplate Schmorl's nodes noted, most notable about the L2-3 interspace. No discrete osseous lesions. Visualized sacrum intact. SI joints approximated and symmetric. Paraspinal and other soft tissues: Paraspinous soft tissues demonstrate no acute abnormality. Mild atherosclerotic plaque  within the infrarenal aorta. Visualized visceral structures otherwise unremarkable. Disc levels: T11-12: Shallow posterior osseous ridging mildly indents the ventral thecal sac. No canal or foraminal stenosis. T12-L1:  Right paracentral disc protrusion with associated spurring mildly indents the ventral thecal sac. No significant stenosis. This is stable from previous. L1-2: Small left paracentral disc protrusion indents the ventral thecal sac. Mild facet arthrosis. No significant canal or foraminal stenosis. This is stable from previous. L2-3: Minimal disc bulge with mild bilateral facet arthropathy. No stenosis. L3-4: Minimal annular disc bulge. Moderate bilateral facet arthrosis. No significant stenosis. L4-5: Trace anterolisthesis. Minimal annular disc bulge. Moderate bilateral facet arthrosis. Resultant mild to moderate spinal stenosis, unchanged. No significant foraminal encroachment. L5-S1:  Mild disc bulge and facet hypertrophy.  No stenosis. Overall appearance of the degenerative changes are not significantly changed relative to previous MRI. No complication identified status post recent injection. IMPRESSION: 1. No acute abnormality within the lumbar spine. No complication identified status post recent injection. 2. Multilevel disc degeneration with facet arthropathy as above, most notable at L4-5. Overall, appearance is not significantly changed relative to most recent MRI from 02/03/2015. Electronically Signed   By: Jeannine Boga M.D.   On: 09/02/2016 19:51   Ct Abdomen Pelvis W Contrast  Result Date: 09/23/2016 CLINICAL DATA:  3-4 episodes of vomiting since last night. EXAM: CT ABDOMEN AND PELVIS WITH CONTRAST TECHNIQUE: Multidetector CT imaging of the abdomen and pelvis was performed using the standard protocol following bolus administration of intravenous contrast. CONTRAST:  137m ISOVUE-300 IOPAMIDOL (ISOVUE-300) INJECTION 61% COMPARISON:  06/24/2015. FINDINGS: Lower chest: Minimal  bibasilar atelectasis. Hepatobiliary: No significant change in multiple liver cysts. Increased lobulation of the liver contours with increased size of the lateral segment of the left lobe of the liver and the caudate lobe. Cholecystectomy clips. Air in the biliary tree is again demonstrated. Pancreas: Unremarkable. No pancreatic ductal dilatation or surrounding inflammatory changes. Spleen:  Enlarged spleen with an interval increase in size. Adrenals/Urinary Tract: Adrenal glands are unremarkable. Kidneys are normal, without renal calculi, focal lesion, or hydronephrosis. Bladder is unremarkable. Stomach/Bowel: Scattered colonic diverticulum. No evidence of appendicitis. Unremarkable stomach and small bowel. Vascular/Lymphatic: Small amount of aortic calcification. No aneurysm or enlarged lymph nodes. Previously demonstrated embolization coils in the left inferior epigastric artery. Prominent, tortuous vessels in the upper abdomen. Reproductive: Surgically absent uterus.  Normal appearing ovaries. Other: Small infraumbilical hernia containing herniated fat. Musculoskeletal: Lumbar and lower thoracic spine degenerative changes. IMPRESSION: 1. No acute abnormality. 2. Changes of cirrhosis of the liver with portal venous hypertension and associated splenomegaly. 3. Air in the biliary tree compatible with previous sphincterotomy. 4. Mild colonic diverticulosis. Electronically Signed   By: SClaudie ReveringM.D.   On: 09/23/2016 16:24       Today   Subjective:   DKenza Munar Still complains of some abdominal discomfort but was able to eat  Objective:   Blood pressure 112/74, pulse (!) 101, temperature 99.3 F (37.4 C), temperature source Oral, resp. rate 19, height 5' 2"  (1.575 m), weight 268 lb 8 oz (121.8 kg), SpO2 96 %.  .  Intake/Output Summary (Last 24 hours) at 09/25/16 1349 Last data filed at 09/25/16 0800  Gross per 24 hour  Intake              240 ml  Output                0 ml  Net               240 ml    Exam VITAL SIGNS: Blood pressure 112/74, pulse (!) 101, temperature 99.3 F (37.4 C), temperature source Oral, resp. rate 19,  height 5' 2"  (1.575 m), weight 268 lb 8 oz (121.8 kg), SpO2 96 %.  GENERAL:  51 y.o.-year-old patient lying in the bed with no acute distress.  EYES: Pupils equal, round, reactive to light and accommodation. No scleral icterus. Extraocular muscles intact.  HEENT: Head atraumatic, normocephalic. Oropharynx and nasopharynx clear.  NECK:  Supple, no jugular venous distention. No thyroid enlargement, no tenderness.  LUNGS: Normal breath sounds bilaterally, no wheezing, rales,rhonchi or crepitation. No use of accessory muscles of respiration.  CARDIOVASCULAR: S1, S2 normal. No murmurs, rubs, or gallops.  ABDOMEN: Soft, nontender, nondistended. Bowel sounds present. No organomegaly or mass.  EXTREMITIES: No pedal edema, cyanosis, or clubbing.  NEUROLOGIC: Cranial nerves II through XII are intact. Muscle strength 5/5 in all extremities. Sensation intact. Gait not checked.  PSYCHIATRIC: The patient is alert and oriented x 3.  SKIN: No obvious rash, lesion, or ulcer.   Data Review     CBC w Diff: Lab Results  Component Value Date   WBC 4.5 09/23/2016   HGB 15.3 09/23/2016   HGB 16.5 (H) 03/27/2014   HCT 43.5 09/23/2016   HCT 49.3 (H) 03/27/2014   PLT 90 (L) 09/23/2016   PLT 209 03/27/2014   LYMPHOPCT 23 12/01/2015   LYMPHOPCT 23.7 03/27/2014   MONOPCT 7 12/01/2015   MONOPCT 5.6 03/27/2014   EOSPCT 1 12/01/2015   EOSPCT 0.4 03/27/2014   BASOPCT 1 12/01/2015   BASOPCT 0.8 03/27/2014   CMP: Lab Results  Component Value Date   NA 141 09/24/2016   NA 142 03/27/2014   K 3.6 09/24/2016   K 3.5 03/27/2014   CL 106 09/24/2016   CL 101 03/27/2014   CO2 26 09/24/2016   CO2 27 03/27/2014   BUN 8 09/24/2016   BUN 16 03/27/2014   CREATININE 0.58 09/24/2016   CREATININE 0.73 03/27/2014   PROT 7.2 09/23/2016   PROT 9.4 (H) 03/27/2014   ALBUMIN 3.9  09/23/2016   ALBUMIN 5.3 (H) 03/27/2014   BILITOT 1.6 (H) 09/23/2016   BILITOT 1.5 (H) 03/27/2014   ALKPHOS 81 09/23/2016   ALKPHOS 63 03/27/2014   AST 142 (H) 09/23/2016   AST 81 (H) 03/27/2014   ALT 86 (H) 09/23/2016   ALT 58 (H) 03/27/2014  .  Micro Results No results found for this or any previous visit (from the past 240 hour(s)).      Code Status Orders        Start     Ordered   09/23/16 1916  Full code  Continuous     09/23/16 1915    Code Status History    Date Active Date Inactive Code Status Order ID Comments User Context   06/28/2016  9:22 PM 06/30/2016  7:09 PM Full Code [id number]  Henreitta Leber, MD Inpatient   03/12/2015 12:09 AM 03/13/2015  5:23 PM Full Code [id number]  Gladstone Lighter, MD Inpatient   10/04/2014 12:59 PM 10/06/2014  8:20 PM Full Code [id number]  Baxter Hire, MD Inpatient   09/05/2014  2:29 AM 09/08/2014  6:10 PM Full Code [id number]  Lance Coon, MD Inpatient   08/25/2014  6:23 PM 08/30/2014  1:48 PM Full Code [id number]  Florene Glen, MD ED   08/03/2014  5:12 PM 08/08/2014  3:16 PM Full Code [id number]  Marlyce Huge, MD ED   07/24/2014  6:45 PM 07/30/2014  5:31 PM Full Code [id number]  Florene Glen, MD Inpatient   07/23/2014 11:42 AM 07/24/2014  3:27 AM Full Code  [id number]  Sabino Dick, MD [place]   07/12/2014 11:25 PM 07/16/2014  4:30 PM Full Code [id number]  Lance Coon, MD Inpatient          Follow-up Information    Lollie Sails, MD Follow up in 1 week(s).   Specialty:  Gastroenterology Why:  f/u hospital , abdominal pain Contact information: [place] [address] [phone]        Ashkin, Neldon Labella, MD Follow up in 1 week(s).   Specialty:  Family Medicine Contact information: [place] [address] [phone]           Discharge Medications   Allergies as of 09/25/2016      Reactions   Tape Swelling   Vicodin [hydrocodone-acetaminophen]  Itching, Rash      Medication List    TAKE these medications   albuterol 108 (90 Base) MCG/ACT inhaler Commonly known as:  PROVENTIL HFA;VENTOLIN HFA Inhale 2 puffs into the lungs every 6 (six) hours as needed for wheezing or shortness of breath.   ALPRAZolam 1 MG tablet Commonly known as:  XANAX Take 1 mg by mouth 3 (three) times daily.   cholecalciferol 1000 units tablet Commonly known as:  VITAMIN D Take 2,000 Units by mouth daily.   citalopram 20 MG tablet Commonly known as:  CELEXA Take 30 mg by mouth daily.   diclofenac sodium 1 % Gel Commonly known as:  VOLTAREN Apply 4 g topically 4 (four) times daily.   dicyclomine 10 MG capsule Commonly known as:  BENTYL Take 10 mg by mouth 2 (two) times daily.   fluticasone 50 MCG/ACT nasal spray Commonly known as:  FLONASE Place 1-2 sprays into both nostrils daily.   furosemide 40 MG tablet Commonly known as:  LASIX Take 40 mg by mouth daily.   gabapentin 300 MG capsule Commonly known as:  NEURONTIN Take 600 mg by mouth 4 (four) times daily.   hydrochlorothiazide 12.5 MG capsule Commonly known as:  MICROZIDE Take 12.5 mg by mouth daily as needed. For high blood pressure   insulin aspart 100 UNIT/ML injection Commonly known as:  novoLOG Inject 30-40 Units into the skin 3 (three) times daily before meals.   insulin detemir 100 UNIT/ML injection Commonly known as:  LEVEMIR Inject 50 Units into the skin 2 (two) times daily.   INTRINSI B12-FOLATE 259-563-87 MCG-MCG-MG Tabs Take 1 tablet by mouth daily. Reported on 04/06/2015   ipratropium-albuterol 0.5-2.5 (3) MG/3ML Soln Commonly known as:  DUONEB Take 3 mLs by nebulization every 4 (four) hours as needed. For shortness of breath/wheezing   lactulose 10 GM/15ML solution Commonly known as:  CHRONULAC Take 10 g by mouth 4 (four) times daily.   nystatin powder Commonly known as:  MYCOSTATIN/NYSTOP Apply 1 g topically 3 (three) times daily as needed. For  irritation   oxycodone 5 MG capsule Commonly known as:  OXY-IR Take 1 capsule (5 mg total) by mouth every 6 (six) hours as needed.   pantoprazole 40 MG tablet Commonly known as:  PROTONIX Take 40 mg by mouth 2 (two) times daily before a meal.   potassium chloride SA 20 MEQ tablet Commonly known as:  K-DUR,KLOR-CON Take 10-20 mEq by mouth daily as needed. For high blood pressure   promethazine 12.5 MG tablet Commonly known as:  PHENERGAN Take 2 tablets (25 mg total) by mouth every 6 (six) hours as needed for nausea or vomiting. What changed:  when to take this  reasons  to take this   rizatriptan 10 MG tablet Commonly known as:  MAXALT Take 10 mg by mouth as needed for migraine.   spironolactone 100 MG tablet Commonly known as:  ALDACTONE Take 100 mg by mouth daily.   vitamin B-12 1000 MCG tablet Commonly known as:  CYANOCOBALAMIN Take 2,000 mcg by mouth daily.   XIFAXAN 550 MG Tabs tablet Generic drug:  rifaximin Take 550 mg by mouth 2 (two) times daily.            Discharge Care Instructions        Start     Ordered   09/25/16 0000  promethazine (PHENERGAN) 12.5 MG tablet  Every 6 hours PRN     09/25/16 1003   09/25/16 0000  oxycodone (OXY-IR) 5 MG capsule  Every 6 hours PRN     09/25/16 1003         Total Time in preparing paper work, data evaluation and todays exam - 35 minutes  Dustin Flock M.D on 09/25/2016 at 1:49 PM  [facility]   Office  [phone]

## 2016-09-25 NOTE — Progress Notes (Signed)
Jonathon Bellows MD, MRCP(U.K) [address]  [place]  [place], [place] [address]  Main: [place] is being followed for acute hepatic encephelopathy  Day 1 of follow up   Subjective:  Multiple bowel movements yesterday . No other complaints   Objective: Vital signs in last 24 hours: Vitals:   09/24/16 1702 09/24/16 1942 09/24/16 2009 09/25/16 0733  BP: 134/88 133/78  112/74  Pulse: 89 (!) 115  (!) 101  Resp:  (!) 21  19  Temp:  98.7 F (37.1 C)  99.3 F (37.4 C)  TempSrc:  Oral  Oral  SpO2: 95% 93% 93% 96%  Weight:      Height:       Weight change:  No intake or output data in the 24 hours ending 09/25/16 1275 Regular rate and rhythm, S1S2 present or without murmur or extra heart sounds  Exam: Heart::  Lungs: normal, clear to auscultation and clear to auscultation and percussion Abdomen: soft, nontender, normal bowel sounds   Lab Results: @LABTEST2 @ Micro Results: No results found for this or any previous visit (from the past 240 hour(s)). Studies/Results: Ct Head Wo Contrast  Result Date: 09/23/2016 CLINICAL DATA:  Dizziness and headaches since last night. Altered mental status. Speech difficulty. EXAM: CT HEAD WITHOUT CONTRAST TECHNIQUE: Contiguous axial images were obtained from the base of the skull through the vertex without intravenous contrast. COMPARISON:  06/24/2015. FINDINGS: Brain: No evidence of acute infarction, hemorrhage, hydrocephalus, extra-axial collection or mass lesion/mass effect. Vascular: No hyperdense vessel or unexpected calcification. Skull: Normal. Negative for fracture or focal lesion. Sinuses/Orbits: Unremarkable. Other: Mild bilateral temporomandibular joint degenerative changes. IMPRESSION: No acute abnormality. Electronically Signed   By: Claudie Revering M.D.   On: 09/23/2016 16:13   Ct Abdomen Pelvis W Contrast  Result Date: 09/23/2016 CLINICAL DATA:  3-4 episodes of vomiting since last night. EXAM: CT ABDOMEN AND  PELVIS WITH CONTRAST TECHNIQUE: Multidetector CT imaging of the abdomen and pelvis was performed using the standard protocol following bolus administration of intravenous contrast. CONTRAST:  159m ISOVUE-300 IOPAMIDOL (ISOVUE-300) INJECTION 61% COMPARISON:  06/24/2015. FINDINGS: Lower chest: Minimal bibasilar atelectasis. Hepatobiliary: No significant change in multiple liver cysts. Increased lobulation of the liver contours with increased size of the lateral segment of the left lobe of the liver and the caudate lobe. Cholecystectomy clips. Air in the biliary tree is again demonstrated. Pancreas: Unremarkable. No pancreatic ductal dilatation or surrounding inflammatory changes. Spleen:  Enlarged spleen with an interval increase in size. Adrenals/Urinary Tract: Adrenal glands are unremarkable. Kidneys are normal, without renal calculi, focal lesion, or hydronephrosis. Bladder is unremarkable. Stomach/Bowel: Scattered colonic diverticulum. No evidence of appendicitis. Unremarkable stomach and small bowel. Vascular/Lymphatic: Small amount of aortic calcification. No aneurysm or enlarged lymph nodes. Previously demonstrated embolization coils in the left inferior epigastric artery. Prominent, tortuous vessels in the upper abdomen. Reproductive: Surgically absent uterus.  Normal appearing ovaries. Other: Small infraumbilical hernia containing herniated fat. Musculoskeletal: Lumbar and lower thoracic spine degenerative changes. IMPRESSION: 1. No acute abnormality. 2. Changes of cirrhosis of the liver with portal venous hypertension and associated splenomegaly. 3. Air in the biliary tree compatible with previous sphincterotomy. 4. Mild colonic diverticulosis. Electronically Signed   By: SClaudie ReveringM.D.   On: 09/23/2016 16:24   Medications: I have reviewed the patient's current medications. Scheduled Meds: . ALPRAZolam  1 mg Oral TID  . citalopram  30 mg Oral Daily  . dicyclomine  10 mg Oral BID  .  enoxaparin  (LOVENOX) injection  40 mg Subcutaneous BID  . ferrous [id number] C-folic acid  1 capsule Oral Daily  . fluticasone  1-2 spray Each Nare Daily  . furosemide  40 mg Oral Daily  . gabapentin  600 mg Oral QID  . insulin aspart  0-5 Units Subcutaneous QHS  . insulin aspart  0-9 Units Subcutaneous TID WC  . insulin aspart  30 Units Subcutaneous TID AC  . insulin detemir  50 Units Subcutaneous BID  . lactulose  20 g Oral QID  . lactulose  300 mL Rectal Once  . pantoprazole  40 mg Oral BID AC  . promethazine  25 mg Oral BID  . rifaximin  550 mg Oral BID  . sodium chloride flush  3 mL Intravenous Q12H  . spironolactone  100 mg Oral Daily  . vitamin B-12  2,000 mcg Oral Daily   Continuous Infusions: . sodium chloride     PRN Meds:.sodium chloride, albuterol, bisacodyl, ibuprofen, ketorolac, morphine injection, ondansetron **OR** ondansetron (ZOFRAN) IV, senna-docusate, sodium chloride flush   Assessment: Active Problems:   Acute hepatic encephalopathy   Mylynn Dinh is a 51 y.o. y/o female with Cirrhosis, constipation come into the hospital with some confusion, abdominal pain and not having a bowel movement in a few days    1. Acute hepatic encephelopathy - No evidence of GI bleed. Constipation likely cause of episode of encephelopathy,  limit narcotics, lactulose QID titrated to 2-3 soft bowel movements. Rifaxamin 521m BID.    2. At discharge can stary on Linzess 290 mcg in addition to lactulose   I will sign off.  Please call me if any further GI concerns or questions.  We would like to thank you for the opportunity to participate in the care of DZondra Lawlor   LOS: 2 days   KJonathon Bellows9/23/2018, 9:21 AM

## 2016-09-25 NOTE — Progress Notes (Signed)
Patient is being discharged home. Husband bedside. DC &RX instructions given and patient and husband acknowledged understanding & had no questions for this nurse. IV removed. Belongings packed. NT to prepare patient for transport home.

## 2016-09-25 NOTE — Discharge Instructions (Signed)
[place] [facility] [place] [facility] [place]:  Cardiac diet, diabetic diet  DISCHARGE CONDITION:  Stable  ACTIVITY:  Activity as tolerated  OXYGEN:  Home Oxygen: No.   Oxygen Delivery: room air  DISCHARGE LOCATION:  home    ADDITIONAL DISCHARGE INSTRUCTION:   If you experience worsening of your admission symptoms, develop shortness of breath, life threatening emergency, suicidal or homicidal thoughts you must seek medical attention immediately by calling 911 or calling your MD immediately  if symptoms less severe.  You Must read complete instructions/literature along with all the possible adverse reactions/side effects for all the Medicines you take and that have been prescribed to you. Take any new Medicines after you have completely understood and accpet all the possible adverse reactions/side effects.   Please note  You were cared for by a hospitalist during your hospital stay. If you have any questions about your discharge medications or the care you received while you were in the hospital after you are discharged, you can call the unit and asked to speak with the hospitalist on call if the hospitalist that took care of you is not available. Once you are discharged, your primary care physician will handle any further medical issues. Please note that NO REFILLS for any discharge medications will be authorized once you are discharged, as it is imperative that you return to your primary care physician (or establish a relationship with a primary care physician if you do not have one) for your aftercare needs so that they can reassess your need for medications and monitor your lab values.

## 2016-09-26 ENCOUNTER — Telehealth: Payer: Self-pay | Admitting: *Deleted

## 2016-09-28 ENCOUNTER — Emergency Department: Payer: Self-pay

## 2016-09-28 ENCOUNTER — Emergency Department
Admission: EM | Admit: 2016-09-28 | Discharge: 2016-09-28 | Disposition: A | Discharge status: Home / Self Care | Payer: Self-pay | Attending: Emergency Medicine | Admitting: Emergency Medicine

## 2016-09-28 ENCOUNTER — Encounter: Payer: Self-pay | Admitting: Emergency Medicine

## 2016-09-28 DIAGNOSIS — E1122 Type 2 diabetes mellitus with diabetic chronic kidney disease: Secondary | ICD-10-CM | POA: Insufficient documentation

## 2016-09-28 DIAGNOSIS — J45909 Unspecified asthma, uncomplicated: Secondary | ICD-10-CM | POA: Insufficient documentation

## 2016-09-28 DIAGNOSIS — R1011 Right upper quadrant pain: Secondary | ICD-10-CM | POA: Insufficient documentation

## 2016-09-28 DIAGNOSIS — Z79899 Other long term (current) drug therapy: Secondary | ICD-10-CM | POA: Insufficient documentation

## 2016-09-28 DIAGNOSIS — I129 Hypertensive chronic kidney disease with stage 1 through stage 4 chronic kidney disease, or unspecified chronic kidney disease: Secondary | ICD-10-CM | POA: Insufficient documentation

## 2016-09-28 DIAGNOSIS — J449 Chronic obstructive pulmonary disease, unspecified: Secondary | ICD-10-CM | POA: Insufficient documentation

## 2016-09-28 DIAGNOSIS — N189 Chronic kidney disease, unspecified: Secondary | ICD-10-CM | POA: Insufficient documentation

## 2016-09-28 DIAGNOSIS — E039 Hypothyroidism, unspecified: Secondary | ICD-10-CM | POA: Insufficient documentation

## 2016-09-28 LAB — URINALYSIS, COMPLETE (UACMP) WITH MICROSCOPIC
Bilirubin Urine: NEGATIVE
Glucose, UA: NEGATIVE mg/dL
Hgb urine dipstick: NEGATIVE
Ketones, ur: NEGATIVE mg/dL
Nitrite: NEGATIVE
Protein, ur: NEGATIVE mg/dL
Specific Gravity, Urine: >1.046 — ABNORMAL HIGH (ref 1.005–1.030)
pH: 5 (ref 5.0–8.0)

## 2016-09-28 LAB — COMPREHENSIVE METABOLIC PANEL
ALT: 56 U/L — ABNORMAL HIGH (ref 14–54)
AST: 92 U/L — ABNORMAL HIGH (ref 15–41)
Albumin: 3.9 g/dL (ref 3.5–5.0)
Alkaline Phosphatase: 89 U/L (ref 38–126)
Anion gap: 10 (ref 5–15)
BUN: 8 mg/dL (ref 6–20)
CO2: 25 mmol/L (ref 22–32)
Calcium: 8.9 mg/dL (ref 8.9–10.3)
Chloride: 102 mmol/L (ref 101–111)
Creatinine, Ser: 0.57 mg/dL (ref 0.44–1.00)
GFR calc Af Amer: >60 mL/min (ref >60)
GFR calc non Af Amer: >60 mL/min (ref >60)
Glucose, Bld: 299 mg/dL — ABNORMAL HIGH (ref 65–99)
Potassium: 3.5 mmol/L (ref 3.5–5.1)
Sodium: 137 mmol/L (ref 135–145)
Total Bilirubin: 1.3 mg/dL — ABNORMAL HIGH (ref 0.3–1.2)
Total Protein: 6.9 g/dL (ref 6.5–8.1)

## 2016-09-28 LAB — LACTIC ACID, PLASMA
Lactic Acid, Venous: 2.7 mmol/L (ref 0.5–1.9)
Lactic Acid, Venous: 1.6 mmol/L (ref 0.5–1.9)

## 2016-09-28 LAB — CBC WITH DIFFERENTIAL/PLATELET
Basophils Absolute: 0 10*3/uL (ref 0–0.1)
Basophils Relative: 1 %
Eosinophils Absolute: 0 10*3/uL (ref 0–0.7)
Eosinophils Relative: 1 %
HCT: 41.9 % (ref 35.0–47.0)
Hemoglobin: 14.8 g/dL (ref 12.0–16.0)
Lymphocytes Relative: 25 %
Lymphs Abs: 0.9 10*3/uL — ABNORMAL LOW (ref 1.0–3.6)
MCH: 35.9 pg — ABNORMAL HIGH (ref 26.0–34.0)
MCHC: 35.3 g/dL (ref 32.0–36.0)
MCV: 101.7 fL — ABNORMAL HIGH (ref 80.0–100.0)
Monocytes Absolute: 0.3 10*3/uL (ref 0.2–0.9)
Monocytes Relative: 7 %
Neutro Abs: 2.5 10*3/uL (ref 1.4–6.5)
Neutrophils Relative %: 66 %
Platelets: 77 10*3/uL — ABNORMAL LOW (ref 150–440)
RBC: 4.12 MIL/uL (ref 3.80–5.20)
RDW: 13.6 % (ref 11.5–14.5)
WBC: 3.7 10*3/uL (ref 3.6–11.0)

## 2016-09-28 LAB — AMMONIA: Ammonia: 79 umol/L — ABNORMAL HIGH (ref 9–35)

## 2016-09-28 LAB — LIPASE, BLOOD: Lipase: 22 U/L (ref 11–51)

## 2016-09-28 MED ORDER — PIPERACILLIN-TAZOBACTAM 3.375 G IVPB 30 MIN
3.3750 g | Freq: Once | INTRAVENOUS | Status: AC
  Administered 2016-09-28: 3.375 g via INTRAVENOUS
  Filled 2016-09-28: qty 50

## 2016-09-28 MED ORDER — KETOROLAC TROMETHAMINE 30 MG/ML IJ SOLN
15.0000 mg | Freq: Once | INTRAMUSCULAR | Status: AC
  Administered 2016-09-28: 15 mg via INTRAVENOUS
  Filled 2016-09-28: qty 1

## 2016-09-28 MED ORDER — MORPHINE SULFATE (PF) 4 MG/ML IV SOLN
4.0000 mg | Freq: Once | INTRAVENOUS | Status: AC
  Administered 2016-09-28: 4 mg via INTRAVENOUS
  Filled 2016-09-28: qty 1

## 2016-09-28 MED ORDER — ONDANSETRON HCL 4 MG/2ML IJ SOLN
4.0000 mg | Freq: Once | INTRAMUSCULAR | Status: AC
  Administered 2016-09-28: 4 mg via INTRAVENOUS
  Filled 2016-09-28: qty 2

## 2016-09-28 MED ORDER — OXYCODONE-ACETAMINOPHEN 5-325 MG PO TABS: 1.0000 | ORAL_TABLET | Freq: Four times a day (QID) | ORAL | 0 refills | Status: DC | PRN

## 2016-09-28 MED ORDER — LACTULOSE 10 GM/15ML PO SOLN
30.0000 g | Freq: Once | ORAL | Status: AC
  Administered 2016-09-28: 30 g via ORAL
  Filled 2016-09-28: qty 60

## 2016-09-28 MED ORDER — IBUPROFEN 600 MG PO TABS: 600.0000 mg | ORAL_TABLET | Freq: Three times a day (TID) | ORAL | 0 refills | Status: DC | PRN

## 2016-09-28 MED ORDER — IOPAMIDOL (ISOVUE-300) INJECTION 61%
100.0000 mL | Freq: Once | INTRAVENOUS | Status: AC | PRN
  Administered 2016-09-28: 100 mL via INTRAVENOUS

## 2016-09-28 MED ORDER — PIPERACILLIN-TAZOBACTAM 3.375 G IVPB 30 MIN
INTRAVENOUS | Status: AC
  Administered 2016-09-28: 3.375 g via INTRAVENOUS
  Filled 2016-09-28: qty 50

## 2016-09-28 MED ORDER — SODIUM CHLORIDE 0.9 % IV BOLUS (SEPSIS)
1000.0000 mL | Freq: Once | INTRAVENOUS | Status: AC
  Administered 2016-09-28: 1000 mL via INTRAVENOUS

## 2016-09-28 NOTE — ED Notes (Signed)
Lactic Acid of 2.7 verbally reported to Dr Mable Paris, MD acknowledges.

## 2016-09-28 NOTE — ED Triage Notes (Signed)
Pt comes into the ED via POV c/o abdominal pain, N/V.  Patient's husband explains that the patient was recently released from the hospital for high ammonia levels and pneumonia.  Patient believes she is more confused that normal.  Patient unable to sit still in wheelchair at this time.  Patient has even respirations at this time.

## 2016-09-28 NOTE — ED Notes (Signed)
Patient returned from radiology

## 2016-09-28 NOTE — Progress Notes (Signed)
Sepsis Note:   Code sepsis called at 65. Page received by pharmacy at [phone].   Zosyn 3.375g IV ordered to start at 1615. Will continue to monitor for administration.   MLS  [phone] [id number].

## 2016-09-28 NOTE — ED Provider Notes (Signed)
[name] Emergency Department Provider Note  ____________________________________________   First MD Initiated Contact with Patient 09/28/16 1555     (approximate)  I have reviewed the triage vital signs and the nursing notes.   HISTORY  Chief Complaint Nausea and Abdominal Pain   HPI Lori Mckenzie is a 51 y.o. female is brought to the emergency department by her husband for several days of increasing right upper quadrant pain. The patient has a complex past medical history including morbid obesity, and NASH cirrhosis, as well as COPD, and diabetes mellitus. she says she has been told by her primary care physician that she will need an MRI of her liver as she has had pain in her right upper quadrant for several weeks. She reports compliance with all of her medications. She is frustrated because she has several chronic diseases and her symptoms do not appear to be resolving.her pain today has been insidious in onset constant aching. Nothing seems to make it better or worse.  Past Medical History:  Diagnosis Date  . Abdominal abscess 08/25/2014  . Acid reflux 08/10/2010  . Acute cervical myofascial strain 02/09/2016  . Anxiety   . Ascites   . Asthma   . Back pain   . Bile leak, postoperative 07/17/2014  . Brittle bone disease   . Cancer ([place])    Uteriine  ca 48yr ago partial hysterectomy  . Cervical disc disease   . Chronic kidney disease   . Collagen vascular disease ([place]    RA  3-4 yrs ago  . COPD (chronic obstructive pulmonary disease) ([place]   . Diabetes mellitus without complication ([place]   . GERD (gastroesophageal reflux disease)   . Hypertension   . Hypothyroidism   . Left upper quadrant pain 01/09/2014  . Major depressive disorder with single episode 12/05/2011  . Major depressive disorder, single episode 12/05/2011  . Migraines   . NASH (nonalcoholic steatohepatitis)   . Respiratory infection    2/17  . Shock ([place] 09/18/2014  . Sleep apnea     . Sleep apnea   . Syncope 11/16/2014  . Thyroid disease   . TIA (transient ischemic attack)     Patient Active Problem List   Diagnosis Date Noted  . Acute hepatic encephalopathy 09/23/2016  . Acute postoperative pain 08/09/2016  . Low back pain due to L1-2 disc extrusion (caudal) (Left) 07/11/2016  . Abdominal pain 06/28/2016  . Constipation 06/20/2016  . Hyperglycemia 06/20/2016  . Nausea without vomiting 06/06/2016  . Chronic pain of lower extremity, bilateral 05/19/2016  . Spinal stenosis, thoracic region (T10-11) 05/19/2016  . Diabetes mellitus, insulin dependent (IDDM), uncontrolled ([place] 04/12/2016  . Chronic sacroiliac joint pain (Bilateral) (L>R) 03/23/2016  . Chronic pain of left upper extremity 03/10/2016  . Chronic radicular cervical pain (L) 03/10/2016  . Osteoarthritis 02/24/2016  . Allodynia 02/09/2016  . Chronic pain syndrome 01/07/2016  . Opiate withdrawal ([place] 12/22/2015  . Elevated liver enzymes 09/28/2015  . Cirrhosis of liver without ascites ([place] 09/24/2015  . Cirrhosis ([place] 09/24/2015  . Altered mental status 07/24/2015  . Uncontrolled diabetes mellitus ([place] 07/24/2015  . Radicular pain of thoracic region 04/06/2015  . Hepatic encephalopathy ([place] 03/11/2015  . Elevated sedimentation rate 03/05/2015  . Elevated C-reactive protein (CRP) 03/05/2015  . Lumbar facet syndrome (Location of Primary Source of Pain) (Bilateral) (R>L) 03/05/2015  . Cervical spondylosis 03/05/2015  . Chronic feet pain (Location of Secondary source of pain) (Bilateral) (R>L) 03/05/2015  . Lumbar spondylosis 03/05/2015  .  Encounter for chronic pain management 03/05/2015  . Chronic shoulder pain (Bilateral) (R>L) 03/05/2015  . Chronic carpal tunnel syndrome (Bilateral) 03/05/2015  . Chronic hip pain (Bilateral) (L>R) 03/05/2015  . Chronic upper back pain (Bilateral) (L>R) 03/05/2015  . Osteoporosis, idiopathic 03/05/2015  . Abnormal MRI, lumbar spine (02/03/2015) 03/05/2015   . Thoracic radiculitis 02/05/2015  . Vitamin D deficiency 01/13/2015  . B12 deficiency 01/13/2015  . Folate deficiency 01/13/2015  . Subacute lumbar radiculopathy (left side) (S1 dermatome) 12/10/2014  . Drowsiness 11/16/2014  . Episode of syncope 11/16/2014  . Somnolence 11/16/2014  . Opiate use (75 MME/Day) 10/28/2014  . Long term prescription opiate use 10/28/2014  . Long term current use of opiate analgesic 10/28/2014  . Encounter for therapeutic drug level monitoring 10/28/2014  . Chronic epigastric abdominal pain 10/28/2014  . Chronic low back pain (Location of Primary Source of Pain) (Bilateral) (R>L) 10/28/2014  . Chronic neck pain (Location of Tertiary source of pain) (Bilateral) (R>L) 10/28/2014  . Ascites 09/05/2014  . NASH (nonalcoholic steatohepatitis) 09/05/2014  . Hypokalemia 09/05/2014  . Dysthymia 08/05/2014  . Other social stressor 08/05/2014  . Steatohepatitis 07/15/2014  . Type 2 diabetes mellitus ([place]) 07/12/2014  . COPD (chronic obstructive pulmonary disease) ([place]) 07/12/2014  . GERD (gastroesophageal reflux disease) 07/12/2014  . OSA on CPAP 07/12/2014  . Anxiety 07/12/2014  . 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 Lumbar and sacral osteoarthritis 03/26/2014  . Myofascial pain 03/26/2014  . Lumbar spinal stenosis 03/26/2014  . Lumbosacral spondylosis without myelopathy 03/26/2014  . 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 Spinal stenosis, lumbar region without neurogenic claudication (L1-2 and L4-5) 03/26/2014  . Spondylosis of lumbar region without myelopathy or radiculopathy 03/26/2014  . Breath shortness 01/09/2014  . Diastolic dysfunction 05/39/7673  . Airway hyperreactivity 08/06/2013  . Essential (primary) hypertension 08/06/2013  . Asthma 08/06/2013  . Clinical depression 12/05/2011    Past Surgical History:  Procedure Laterality Date  . ABDOMINAL HYSTERECTOMY    . CHOLECYSTECTOMY N/A 07/15/2014   Procedure: LAPAROSCOPIC  CHOLECYSTECTOMY with liver biopsy ;  Surgeon: Sherri Rad, MD;  Location: [facility] ORS;  Service: General;  Laterality: N/A;  . COLONOSCOPY WITH PROPOFOL N/A 06/23/2014   Procedure: COLONOSCOPY WITH PROPOFOL;  Surgeon: Lollie Sails, MD;  Location: [facility] ENDOSCOPY;  Service: Endoscopy;  Laterality: N/A;  . ERCP N/A 07/16/2014   Procedure: ENDOSCOPIC RETROGRADE CHOLANGIOPANCREATOGRAPHY (ERCP);  Surgeon: Clarene Essex, MD;  Location: [facility] ENDOSCOPY;  Service: Endoscopy;  Laterality: N/A;  . ERCP N/A 10/03/2014   Procedure: ENDOSCOPIC RETROGRADE CHOLANGIOPANCREATOGRAPHY (ERCP);  Surgeon: Hulen Luster, MD;  Location: [facility]COPY;  Service: Gastroenterology;  Laterality: N/A;  . ESOPHAGOGASTRODUODENOSCOPY N/A 06/23/2014   Procedure: ESOPHAGOGASTRODUODENOSCOPY (EGD);  Surgeon: Lollie Sails, MD;  Location: [facility] ENDOSCOPY;  Service: Endoscopy;  Laterality: N/A;  . ESOPHAGOGASTRODUODENOSCOPY N/A 06/30/2016   Procedure: ESOPHAGOGASTRODUODENOSCOPY (EGD);  Surgeon: Jonathon Bellows, MD;  Location: [facility] ENDOSCOPY;  Service: Endoscopy;  Laterality: N/A;  . ESOPHAGOGASTRODUODENOSCOPY (EGD) WITH PROPOFOL N/A 12/01/2015   Procedure: ESOPHAGOGASTRODUODENOSCOPY (EGD) WITH PROPOFOL;  Surgeon: Lollie Sails, MD;  Location[facility] ENDOSCOPY;  Service: Endoscopy;  Laterality: N/A;  . TUBAL LIGATION    . WISDOM TOOTH EXTRACTION      Prior to Admission medications   Medication Sig Start Date End Date Taking? Authorizing Provider  ALPRAZolam Duanne Moron) 1 MG tablet Take 1 mg by mouth 3 (three) times daily.    Yes [provider]  cholecalciferol (VITAMIN D) 1000 units tablet Take 2,000 Units by  mouth daily.   Yes [provider]  citalopram (CELEXA) 20 MG tablet Take 30-40 mg by mouth daily.  07/24/15  Yes [provider]  diclofenac sodium (VOLTAREN) 1 % GEL Apply 4 g topically 4 (four) times daily. 04/12/16 10/09/16 Yes Milinda Pointer, MD  dicyclomine (BENTYL) 10 MG capsule Take 10 mg by mouth 2 (two)  times daily.    Yes [provider]  fluticasone (FLONASE) 50 MCG/ACT nasal spray Place 1-2 sprays into both nostrils daily.    Yes [provider]  Folate-B12-Intrinsic Factor (INTRINSI B12-FOLATE) 315-945-85 MCG-MCG-MG TABS Take 1 tablet by mouth daily. Reported on 04/06/2015   Yes [provider]  furosemide (LASIX) 40 MG tablet Take 40 mg by mouth daily.    Yes [provider]  gabapentin (NEURONTIN) 300 MG capsule Take 600 mg by mouth 4 (four) times daily.    Yes [provider]  insulin aspart (NOVOLOG) 100 UNIT/ML injection Inject 30-40 Units into the skin 3 (three) times daily before meals.   Yes [provider]  insulin detemir (LEVEMIR) 100 UNIT/ML injection Inject 50 Units into the skin 2 (two) times daily.   Yes [provider]  lactulose (CHRONULAC) 10 GM/15ML solution Take 10 g by mouth 4 (four) times daily.    Yes [provider]  pantoprazole (PROTONIX) 40 MG tablet Take 40 mg by mouth 2 (two) times daily before a meal.   Yes [provider]  promethazine (PHENERGAN) 12.5 MG tablet Take 2 tablets (25 mg total) by mouth every 6 (six) hours as needed for nausea or vomiting. 09/25/16  Yes Dustin Flock, MD  rifaximin (XIFAXAN) 550 MG TABS tablet Take 550 mg by mouth 2 (two) times daily.    Yes [provider]  spironolactone (ALDACTONE) 100 MG tablet Take 100 mg by mouth daily.    Yes [provider]  vitamin B-12 (CYANOCOBALAMIN) 1000 MCG tablet Take 2,000 mcg by mouth daily.   Yes [provider]  albuterol (PROVENTIL HFA;VENTOLIN HFA) 108 (90 Base) MCG/ACT inhaler Inhale 2 puffs into the lungs every 6 (six) hours as needed for wheezing or shortness of breath.    [provider]  hydrochlorothiazide (MICROZIDE) 12.5 MG capsule Take 12.5 mg by mouth daily as needed. For high blood pressure    [provider]  ibuprofen (ADVIL,MOTRIN) 600 MG tablet Take 1 tablet  (600 mg total) by mouth every 8 (eight) hours as needed. 09/28/16   Darel Hong, MD  ipratropium-albuterol (DUONEB) 0.5-2.5 (3) MG/3ML SOLN Take 3 mLs by nebulization every 4 (four) hours as needed. For shortness of breath/wheezing    [provider]  nystatin (MYCOSTATIN) powder Apply 1 g topically 3 (three) times daily as needed. For irritation    [provider]  oxycodone (OXY-IR) 5 MG capsule Take 1 capsule (5 mg total) by mouth every 6 (six) hours as needed. 09/25/16   Dustin Flock, MD  oxyCODONE-acetaminophen (ROXICET) 5-325 MG tablet Take 1 tablet by mouth every 6 (six) hours as needed for severe pain. 09/28/16   Darel Hong, MD  potassium chloride SA (K-DUR,KLOR-CON) 20 MEQ tablet Take 10-20 mEq by mouth daily as needed. For high blood pressure    [provider]  rizatriptan (MAXALT) 10 MG tablet Take 10 mg by mouth as needed for migraine.     [provider]    Allergies Tape and Vicodin [hydrocodone-acetaminophen]  Family History  Problem Relation Age of Onset  . Lung cancer Mother   .  Ulcers Father   . Emphysema Father   . Heart disease Sister   . Ulcers Sister   . Heart disease Brother     Social History Social History  Substance Use Topics  . Smoking status: Never Smoker  . Smokeless tobacco: Never Used  . Alcohol use No     Comment: occ    Review of Systems Constitutional: No fever/chills Eyes: No visual changes. ENT: No sore throat. Cardiovascular: Denies chest pain. Respiratory: Denies shortness of breath. Gastrointestinal: positive for abdominal pain.  positive fornausea, no vomiting.  No diarrhea.  No constipation. Genitourinary: Negative for dysuria. Musculoskeletal: Negative for back pain. Skin: Negative for rash. Neurological: Negative for headaches, focal weakness or numbness.   ____________________________________________   PHYSICAL EXAM:  VITAL SIGNS: ED Triage Vitals  Enc Vitals Group     BP  09/28/16 1436 105/74     Pulse Rate 09/28/16 1436 (!) 113     Resp 09/28/16 1436 (!) 22     Temp 09/28/16 1436 99.2 F (37.3 C)     Temp Source 09/28/16 1436 Oral     SpO2 09/28/16 1436 95 %     Weight 09/28/16 1437 268 lb (121.6 kg)     Height 09/28/16 1437 5' 2"  (1.575 m)     Head Circumference --      Peak Flow --      Pain Score 09/28/16 1436 9     Pain Loc --      Pain Edu? --      Excl. in [place]? --     Constitutional: morbidly obese alert and oriented 4 appears somewhat uncomfortable no diaphoresis Eyes: PERRL EOMI. Head: Atraumatic. Nose: No congestion/rhinnorhea. Mouth/Throat: No trismus Neck: No stridor.   Cardiovascular: tachycardic rate, regular rhythm. Grossly normal heart sounds.  Good peripheral circulation. Respiratory: Normal respiratory effort.  No retractions. Lungs CTAB and moving good air Gastrointestinal: obese soft mild diffuse tenderness worse on the right no rebound or guarding no frank peritonitis Musculoskeletal: No lower extremity edema   Neurologic:  Normal speech and language. No gross focal neurologic deficits are appreciated. Skin:  Skin is warm, dry and intact. No rash noted. Psychiatric: Mood and affect are normal. Speech and behavior are normal.    ____________________________________________   DIFFERENTIAL includes but not limited to  appendicitis, diverticulitis, urinary tract infection, renal colic, pyelonephritis ____________________________________________   LABS (all labs ordered are listed, but only abnormal results are displayed)  Labs Reviewed  LACTIC ACID, PLASMA - Abnormal; Notable for the following:       Result Value   Lactic Acid, Venous 2.7 (*)    All other components within normal limits  COMPREHENSIVE METABOLIC PANEL - Abnormal; Notable for the following:    Glucose, Bld 299 (*)    AST 92 (*)    ALT 56 (*)    Total Bilirubin 1.3 (*)    All other components within normal limits  CBC WITH DIFFERENTIAL/PLATELET -  Abnormal; Notable for the following:    MCV 101.7 (*)    MCH 35.9 (*)    Platelets 77 (*)    Lymphs Abs 0.9 (*)    All other components within normal limits  URINALYSIS, COMPLETE (UACMP) WITH MICROSCOPIC - Abnormal; Notable for the following:    Color, Urine YELLOW (*)    APPearance CLEAR (*)    Specific Gravity, Urine >1.046 (*)    Leukocytes, UA TRACE (*)    Bacteria, UA RARE (*)    Squamous Epithelial /  LPF 6-30 (*)    All other components within normal limits  AMMONIA - Abnormal; Notable for the following:    Ammonia 79 (*)    All other components within normal limits  CULTURE, BLOOD (ROUTINE X 2)  CULTURE, BLOOD (ROUTINE X 2)  LACTIC ACID, PLASMA  LIPASE, BLOOD    blood work reviewed and interpreted by me first lactate elevated although cleared nicely urinalysis consistent with dehydration __________________________________________  EKG   ____________________________________________  RADIOLOGY  CT scan of the abdomen and pelvis reviewed by me shows no acute disease ____________________________________________   PROCEDURES  Procedure(s) performed: no  Procedures  Critical Care performed: no  Observation: no ____________________________________________   INITIAL IMPRESSION / ASSESSMENT AND PLAN / ED COURSE  Pertinent labs & imaging results that were available during my care of the patient were reviewed by me and considered in my medical decision making (see chart for details).  On arrival the patient is tachycardic and uncomfortable appearing. She does report a temperature of 100 at home yesterday and along with an elevated lactate sent to triage this is concerning for possible sepsis. Labs will be brought in and she'll be given Zosyn now.     ----------------------------------------- 4:05 PM on 09/28/2016 -----------------------------------------  the patient is 5 foot 2 and 121.6 kilograms which gives her a BMI of 48.9. It is unsafe to use actual  body weight for her sepsis calculation so instead we will use ideal body weight which would be 50 kg. 30 cc/kg is 1.5 L.  At this point the patient's blood pressure is 105/74 and her map is greater than 65 and her lactate is less than 4 so she does not require that large volume of fluids. I will give her 1 L now. The patient also has cirrhosis so she may have some component of decreased lactate clearance.___________________________________________   Fortunately the patient's CT scan is negative for acute disease. Her lactate is cleared nicely and I believe it was never actually secondary to sepsis but from decreased clearance as above. The patient does report that her pain has been chronic for some time and she understands she must follow up with her primary care physician for further evaluation. Strict return precautions given and the patient is medically stable for outpatient management. She had her husband verbalize understanding and agreement with the plan.  FINAL CLINICAL IMPRESSION(S) / ED DIAGNOSES  Final diagnoses:  Right upper quadrant abdominal pain      NEW MEDICATIONS STARTED DURING THIS VISIT:  Discharge Medication List as of 09/28/2016  7:48 PM    START taking these medications   Details  ibuprofen (ADVIL,MOTRIN) 600 MG tablet Take 1 tablet (600 mg total) by mouth every 8 (eight) hours as needed., Starting Wed 09/28/2016, Print    oxyCODONE-acetaminophen (ROXICET) 5-325 MG tablet Take 1 tablet by mouth every 6 (six) hours as needed for severe pain., Starting Wed 09/28/2016, Print         Note:  This document was prepared using Dragon voice recognition software and may include unintentional dictation errors.     Darel Hong, MD 09/28/16 [phone]

## 2016-10-02 ENCOUNTER — Emergency Department: Payer: Medicaid Other

## 2016-10-02 ENCOUNTER — Emergency Department
Admission: EM | Admit: 2016-10-02 | Discharge: 2016-10-02 | Disposition: A | Discharge status: Home / Self Care | Payer: Medicaid Other | Attending: Emergency Medicine | Admitting: Emergency Medicine

## 2016-10-02 DIAGNOSIS — R1030 Lower abdominal pain, unspecified: Secondary | ICD-10-CM | POA: Diagnosis present

## 2016-10-02 DIAGNOSIS — R1031 Right lower quadrant pain: Secondary | ICD-10-CM | POA: Insufficient documentation

## 2016-10-02 DIAGNOSIS — R109 Unspecified abdominal pain: Secondary | ICD-10-CM

## 2016-10-02 LAB — URINALYSIS, COMPLETE (UACMP) WITH MICROSCOPIC
Bilirubin Urine: NEGATIVE
Glucose, UA: 50 mg/dL — AB
Hgb urine dipstick: NEGATIVE
Ketones, ur: NEGATIVE mg/dL
Nitrite: NEGATIVE
Protein, ur: 30 mg/dL — AB
Specific Gravity, Urine: 1.021 (ref 1.005–1.030)
pH: 6 (ref 5.0–8.0)

## 2016-10-02 LAB — COMPREHENSIVE METABOLIC PANEL
ALT: 57 U/L — ABNORMAL HIGH (ref 14–54)
AST: 103 U/L — ABNORMAL HIGH (ref 15–41)
Albumin: 3.6 g/dL (ref 3.5–5.0)
Alkaline Phosphatase: 78 U/L (ref 38–126)
Anion gap: 10 (ref 5–15)
BUN: <5 mg/dL — ABNORMAL LOW (ref 6–20)
CO2: 26 mmol/L (ref 22–32)
Calcium: 8.8 mg/dL — ABNORMAL LOW (ref 8.9–10.3)
Chloride: 101 mmol/L (ref 101–111)
Creatinine, Ser: 0.56 mg/dL (ref 0.44–1.00)
GFR calc Af Amer: >60 mL/min (ref >60)
GFR calc non Af Amer: >60 mL/min (ref >60)
Glucose, Bld: 254 mg/dL — ABNORMAL HIGH (ref 65–99)
Potassium: 3.8 mmol/L (ref 3.5–5.1)
Sodium: 137 mmol/L (ref 135–145)
Total Bilirubin: 1.3 mg/dL — ABNORMAL HIGH (ref 0.3–1.2)
Total Protein: 6.6 g/dL (ref 6.5–8.1)

## 2016-10-02 LAB — DIFFERENTIAL
Basophils Absolute: 0 10*3/uL (ref 0–0.1)
Basophils Relative: 1 %
Eosinophils Absolute: 0 10*3/uL (ref 0–0.7)
Eosinophils Relative: 1 %
Lymphocytes Relative: 30 %
Lymphs Abs: 1.2 10*3/uL (ref 1.0–3.6)
Monocytes Absolute: 0.3 10*3/uL (ref 0.2–0.9)
Monocytes Relative: 7 %
Neutro Abs: 2.4 10*3/uL (ref 1.4–6.5)
Neutrophils Relative %: 61 %

## 2016-10-02 LAB — CBC
HCT: 41.6 % (ref 35.0–47.0)
Hemoglobin: 14.6 g/dL (ref 12.0–16.0)
MCH: 35.7 pg — ABNORMAL HIGH (ref 26.0–34.0)
MCHC: 35 g/dL (ref 32.0–36.0)
MCV: 101.9 fL — ABNORMAL HIGH (ref 80.0–100.0)
Platelets: 78 10*3/uL — ABNORMAL LOW (ref 150–440)
RBC: 4.08 MIL/uL (ref 3.80–5.20)
RDW: 13.6 % (ref 11.5–14.5)
WBC: 4 10*3/uL (ref 3.6–11.0)

## 2016-10-02 LAB — AMMONIA: Ammonia: 77 umol/L — ABNORMAL HIGH (ref 9–35)

## 2016-10-02 LAB — LIPASE, BLOOD: Lipase: 23 U/L (ref 11–51)

## 2016-10-02 MED ORDER — IOPAMIDOL (ISOVUE-300) INJECTION 61%
125.0000 mL | Freq: Once | INTRAVENOUS | Status: AC | PRN
  Administered 2016-10-02: 125 mL via INTRAVENOUS

## 2016-10-02 MED ORDER — IOPAMIDOL (ISOVUE-300) INJECTION 61%
30.0000 mL | Freq: Once | INTRAVENOUS | Status: AC | PRN
  Administered 2016-10-02: 30 mL via ORAL

## 2016-10-02 MED ORDER — OXYCODONE HCL 5 MG PO TABS: 5.0000 mg | ORAL_TABLET | ORAL | 0 refills | Status: DC | PRN

## 2016-10-02 MED ORDER — ONDANSETRON HCL 4 MG/2ML IJ SOLN
4.0000 mg | Freq: Once | INTRAMUSCULAR | Status: AC
  Administered 2016-10-02: 4 mg via INTRAVENOUS
  Filled 2016-10-02: qty 2

## 2016-10-02 MED ORDER — MORPHINE SULFATE (PF) 4 MG/ML IV SOLN
4.0000 mg | Freq: Once | INTRAVENOUS | Status: AC
  Administered 2016-10-02: 4 mg via INTRAVENOUS
  Filled 2016-10-02: qty 1

## 2016-10-02 NOTE — Discharge Instructions (Signed)
please follow-up with your family doctor and Dr. Gustavo Lah the gastroenterologist. Please return for worse pain fever or vomiting. If you have severe pain you can try the oxycodone one pill up to 4 times a day. It might be that she would have a T-sheet was from the bile leak and the surgery.

## 2016-10-02 NOTE — ED Triage Notes (Signed)
Pt came to ED via pov c/o right lower abdominal pain. Recently admitted for hepatic encephalopathy. Reports nausea and fatigue. Bp 135/87. HR 98.

## 2016-10-02 NOTE — ED Provider Notes (Signed)
[facility] Emergency Department Provider Note   ____________________________________________   First MD Initiated Contact with Patient 10/02/16 1802     (approximate)  I have reviewed the triage vital signs and the nursing notes.   HISTORY  Chief Complaint Abdominal Pain    HPI Lori Mckenzie is a 51 y.o. female Patient complains of continuing abdominal pain especially in the right side of the belly in the upper part of the right side of the belly is kind of achy doll occasionally crampy and going on since she had her bile leak. She also has a history of nonalcoholic cirrhosis. She is getting very frustrated with the pain and she can't figure out why she still having it. She's not had any fever vomiting or diarrhea.patient is awake and alert doing well she wants more pain shot on discharge. Her ammonia is somewhat elevated but she is not having any symptoms of this.   Past Medical History:  Diagnosis Date  . Abdominal abscess 08/25/2014  . Acid reflux 08/10/2010  . Acute cervical myofascial strain 02/09/2016  . Anxiety   . Ascites   . Asthma   . Back pain   . Bile leak, postoperative 07/17/2014  . Brittle bone disease   . Cancer ([place])    Uteriine  ca 31yr ago partial hysterectomy  . Cervical disc disease   . Chronic kidney disease   . Collagen vascular disease ([place]    RA  3-4 yrs ago  . COPD (chronic obstructive pulmonary disease) ([place]   . Diabetes mellitus without complication ([place]   . GERD (gastroesophageal reflux disease)   . Hypertension   . Hypothyroidism   . Left upper quadrant pain 01/09/2014  . Major depressive disorder with single episode 12/05/2011  . Major depressive disorder, single episode 12/05/2011  . Migraines   . NASH (nonalcoholic steatohepatitis)   . Respiratory infection    2/17  . Shock ([place] 09/18/2014  . Sleep apnea   . Sleep apnea   . Syncope 11/16/2014  . Thyroid disease   . TIA (transient ischemic attack)     Patient  Active Problem List   Diagnosis Date Noted  . Acute hepatic encephalopathy 09/23/2016  . Acute postoperative pain 08/09/2016  . Low back pain due to L1-2 disc extrusion (caudal) (Left) 07/11/2016  . Abdominal pain 06/28/2016  . Constipation 06/20/2016  . Hyperglycemia 06/20/2016  . Nausea without vomiting 06/06/2016  . Chronic pain of lower extremity, bilateral 05/19/2016  . Spinal stenosis, thoracic region (T10-11) 05/19/2016  . Diabetes mellitus, insulin dependent (IDDM), uncontrolled ([place] 04/12/2016  . Chronic sacroiliac joint pain (Bilateral) (L>R) 03/23/2016  . Chronic pain of left upper extremity 03/10/2016  . Chronic radicular cervical pain (L) 03/10/2016  . Osteoarthritis 02/24/2016  . Allodynia 02/09/2016  . Chronic pain syndrome 01/07/2016  . Opiate withdrawal ([place] 12/22/2015  . Elevated liver enzymes 09/28/2015  . Cirrhosis of liver without ascites ([place] 09/24/2015  . Cirrhosis ([place] 09/24/2015  . Altered mental status 07/24/2015  . Uncontrolled diabetes mellitus ([place] 07/24/2015  . Radicular pain of thoracic region 04/06/2015  . Hepatic encephalopathy ([place] 03/11/2015  . Elevated sedimentation rate 03/05/2015  . Elevated C-reactive protein (CRP) 03/05/2015  . Lumbar facet syndrome (Location of Primary Source of Pain) (Bilateral) (R>L) 03/05/2015  . Cervical spondylosis 03/05/2015  . Chronic feet pain (Location of Secondary source of pain) (Bilateral) (R>L) 03/05/2015  . Lumbar spondylosis 03/05/2015  . Encounter for chronic pain management 03/05/2015  . Chronic shoulder pain (  Bilateral) (R>L) 03/05/2015  . Chronic carpal tunnel syndrome (Bilateral) 03/05/2015  . Chronic hip pain (Bilateral) (L>R) 03/05/2015  . Chronic upper back pain (Bilateral) (L>R) 03/05/2015  . Osteoporosis, idiopathic 03/05/2015  . Abnormal MRI, lumbar spine (02/03/2015) 03/05/2015  . Thoracic radiculitis 02/05/2015  . Vitamin D deficiency 01/13/2015  . B12 deficiency 01/13/2015  . Folate  deficiency 01/13/2015  . Subacute lumbar radiculopathy (left side) (S1 dermatome) 12/10/2014  . Drowsiness 11/16/2014  . Episode of syncope 11/16/2014  . Somnolence 11/16/2014  . Opiate use (75 MME/Day) 10/28/2014  . Long term prescription opiate use 10/28/2014  . Long term current use of opiate analgesic 10/28/2014  . Encounter for therapeutic drug level monitoring 10/28/2014  . Chronic epigastric abdominal pain 10/28/2014  . Chronic low back pain (Location of Primary Source of Pain) (Bilateral) (R>L) 10/28/2014  . Chronic neck pain (Location of Tertiary source of pain) (Bilateral) (R>L) 10/28/2014  . Ascites 09/05/2014  . NASH (nonalcoholic steatohepatitis) 09/05/2014  . Hypokalemia 09/05/2014  . Dysthymia 08/05/2014  . Other social stressor 08/05/2014  . Steatohepatitis 07/15/2014  . Type 2 diabetes mellitus ([place]) 07/12/2014  . COPD (chronic obstructive pulmonary disease) ([place]) 07/12/2014  . GERD (gastroesophageal reflux disease) 07/12/2014  . OSA on CPAP 07/12/2014  . Anxiety 07/12/2014  . 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 Lumbar and sacral osteoarthritis 03/26/2014  . Myofascial pain 03/26/2014  . Lumbar spinal stenosis 03/26/2014  . Lumbosacral spondylosis without myelopathy 03/26/2014  . 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 Spinal stenosis, lumbar region without neurogenic claudication (L1-2 and L4-5) 03/26/2014  . Spondylosis of lumbar region without myelopathy or radiculopathy 03/26/2014  . Breath shortness 01/09/2014  . Diastolic dysfunction 33/43/5686  . Airway hyperreactivity 08/06/2013  . Essential (primary) hypertension 08/06/2013  . Asthma 08/06/2013  . Clinical depression 12/05/2011    Past Surgical History:  Procedure Laterality Date  . ABDOMINAL HYSTERECTOMY    . CHOLECYSTECTOMY N/A 07/15/2014   Procedure: LAPAROSCOPIC CHOLECYSTECTOMY with liver biopsy ;  Surgeon: Sherri Rad, MD;  Location: [facility] ORS;  Service: General;  Laterality:  N/A;  . COLONOSCOPY WITH PROPOFOL N/A 06/23/2014   Procedure: COLONOSCOPY WITH PROPOFOL;  Surgeon: Lollie Sails, MD;  Location: [facility] ENDOSCOPY;  Service: Endoscopy;  Laterality: N/A;  . ERCP N/A 07/16/2014   Procedure: ENDOSCOPIC RETROGRADE CHOLANGIOPANCREATOGRAPHY (ERCP);  Surgeon: Clarene Essex, MD;  Location: [facility] ENDOSCOPY;  Service: Endoscopy;  Laterality: N/A;  . ERCP N/A 10/03/2014   Procedure: ENDOSCOPIC RETROGRADE CHOLANGIOPANCREATOGRAPHY (ERCP);  Surgeon: Hulen Luster, MD;  Location[facility] ENDOSCOPY;  Service: Gastroenterology;  Laterality: N/A;  . ESOPHAGOGASTRODUODENOSCOPY N/A 06/23/2014   Procedure: ESOPHAGOGASTRODUODENOSCOPY (EGD);  Surgeon: Lollie Sails, MD;  Location: [facility] ENDOSCOPY;  Service: Endoscopy;  Laterality: N/A;  . ESOPHAGOGASTRODUODENOSCOPY N/A 06/30/2016   Procedure: ESOPHAGOGASTRODUODENOSCOPY (EGD);  Surgeon: Jonathon Bellows, MD;  Location: [facility] ENDOSCOPY;  Service: Endoscopy;  Laterality: N/A;  . ESOPHAGOGASTRODUODENOSCOPY (EGD) WITH PROPOFOL N/A 12/01/2015   Procedure: ESOPHAGOGASTRODUODENOSCOPY (EGD) WITH PROPOFOL;  Surgeon: Lollie Sails, MD;  Location: [facility] END[facility]COPY;  Service: Endoscopy;  Laterality: N/A;  . TUBAL LIGATION    . WISDOM TOOTH EXTRACTION      Prior to Admission medications   Medication Sig Start Date End Date Taking? Authorizing Provider  albuterol (PROVENTIL HFA;VENTOLIN HFA) 108 (90 Base) MCG/ACT inhaler Inhale 2 puffs into the lungs every 6 (six) hours as needed for wheezing or shortness of breath.    [provider]  ALPRAZolam Duanne Moron) 1 MG tablet Take 1 mg by  mouth 3 (three) times daily.     [provider]  cholecalciferol (VITAMIN D) 1000 units tablet Take 2,000 Units by mouth daily.    [provider]  citalopram (CELEXA) 20 MG tablet Take 30-40 mg by mouth daily.  07/24/15   [provider]  diclofenac sodium (VOLTAREN) 1 % GEL Apply 4 g topically 4 (four) times daily. 04/12/16 10/09/16  Milinda Pointer,  MD  dicyclomine (BENTYL) 10 MG capsule Take 10 mg by mouth 2 (two) times daily.     [provider]  fluticasone (FLONASE) 50 MCG/ACT nasal spray Place 1-2 sprays into both nostrils daily.     [provider]  Folate-B12-Intrinsic Factor (INTRINSI B12-FOLATE) 333-545-62 MCG-MCG-MG TABS Take 1 tablet by mouth daily. Reported on 04/06/2015    [provider]  furosemide (LASIX) 40 MG tablet Take 40 mg by mouth daily.     [provider]  gabapentin (NEURONTIN) 300 MG capsule Take 600 mg by mouth 4 (four) times daily.     [provider]  hydrochlorothiazide (MICROZIDE) 12.5 MG capsule Take 12.5 mg by mouth daily as needed. For high blood pressure    [provider]  ibuprofen (ADVIL,MOTRIN) 600 MG tablet Take 1 tablet (600 mg total) by mouth every 8 (eight) hours as needed. 09/28/16   Darel Hong, MD  insulin aspart (NOVOLOG) 100 UNIT/ML injection Inject 30-40 Units into the skin 3 (three) times daily before meals.    [provider]  insulin detemir (LEVEMIR) 100 UNIT/ML injection Inject 50 Units into the skin 2 (two) times daily.    [provider]  ipratropium-albuterol (DUONEB) 0.5-2.5 (3) MG/3ML SOLN Take 3 mLs by nebulization every 4 (four) hours as needed. For shortness of breath/wheezing    [provider]  lactulose (CHRONULAC) 10 GM/15ML solution Take 10 g by mouth 4 (four) times daily.     [provider]  nystatin (MYCOSTATIN) powder Apply 1 g topically 3 (three) times daily as needed. For irritation    [provider]  oxyCODONE (OXY IR/ROXICODONE) 5 MG immediate release tablet Take 1 tablet (5 mg total) by mouth every 4 (four) hours as needed for severe pain. 10/02/16   Nena Polio, MD  oxycodone (OXY-IR) 5 MG capsule Take 1 capsule (5 mg total) by mouth every 6 (six) hours as needed. 09/25/16   Dustin Flock, MD  oxyCODONE-acetaminophen (ROXICET) 5-325 MG tablet Take 1 tablet by mouth  every 6 (six) hours as needed for severe pain. 09/28/16   Darel Hong, MD  pantoprazole (PROTONIX) 40 MG tablet Take 40 mg by mouth 2 (two) times daily before a meal.    [provider]  potassium chloride SA (K-DUR,KLOR-CON) 20 MEQ tablet Take 10-20 mEq by mouth daily as needed. For high blood pressure    [provider]  promethazine (PHENERGAN) 12.5 MG tablet Take 2 tablets (25 mg total) by mouth every 6 (six) hours as needed for nausea or vomiting. 09/25/16   Dustin Flock, MD  rifaximin (XIFAXAN) 550 MG TABS tablet Take 550 mg by mouth 2 (two) times daily.     [provider]  rizatriptan (MAXALT) 10 MG tablet Take 10 mg by mouth as needed for migraine.     [provider]  spironolactone (ALDACTONE) 100 MG tablet Take 100 mg by mouth daily.     [provider]  vitamin B-12 (CYANOCOBALAMIN) 1000 MCG tablet Take 2,000 mcg by mouth daily.    [provider]  Allergies Tape and Vicodin [hydrocodone-acetaminophen]  Family History  Problem Relation Age of Onset  . Lung cancer Mother   . Ulcers Father   . Emphysema Father   . Heart disease Sister   . Ulcers Sister   . Heart disease Brother     Social History Social History  Substance Use Topics  . Smoking status: Never Smoker  . Smokeless tobacco: Never Used  . Alcohol use No     Comment: occ    Review of Systems  Constitutional: No fever/chills Eyes: No visual changes. ENT: No sore throat. Cardiovascular: Denies chest pain. Respiratory: Denies shortness of breath. Gastrointestinal: see history of present illness Genitourinary: Negative for dysuria. Musculoskeletal: Negative for back pain. Skin: Negative for rash. Neurological: Negative for headaches, focal weakness   ____________________________________________   PHYSICAL EXAM:  VITAL SIGNS: ED Triage Vitals  Enc Vitals Group     BP 10/02/16 1749 93/69     Pulse Rate 10/02/16 1749 (!) 105     Resp  10/02/16 1749 18     Temp 10/02/16 1749 98.4 F (36.9 C)     Temp Source 10/02/16 1749 Oral     SpO2 10/02/16 1749 100 %     Weight 10/02/16 1749 268 lb (121.6 kg)     Height 10/02/16 1749 5' 2"  (1.575 m)     Head Circumference --      Peak Flow --      Pain Score 10/02/16 1810 9     Pain Loc --      Pain Edu? --      Excl. in [place]? --     Constitutional: Alert and oriented. Well appearing and in no acute distress. Eyes: Conjunctivae are normal.  Head: Atraumatic. Nose: No congestion/rhinnorhea. Mouth/Throat: Mucous membranes are moist.  Oropharynx non-erythematous. Neck: No stridor.  Cardiovascular: Normal rate, regular rhythm. Grossly normal heart sounds.  Good peripheral circulation. Respiratory: Normal respiratory effort.  No retractions. Lungs CTAB. Gastrointestinal: Soft somewhat tender to palpation in the right upper quadrant No distention. No abdominal bruits. No CVA tenderness.  but not severelyMusculoskeletal: No lower extremity tenderness nor edema.  No joint effusions. Neurologic:  Normal speech and language. No gross focal neurologic deficits are appreciated. No gait instability. Skin:  Skin is warm, dry and intact. No rash noted. Psychiatric: Mood and affect are normal. Speech and behavior are normal.  ____________________________________________   LABS (all labs ordered are listed, but only abnormal results are displayed)  Labs Reviewed  COMPREHENSIVE METABOLIC PANEL - Abnormal; Notable for the following:       Result Value   Glucose, Bld 254 (*)    BUN <5 (*)    Calcium 8.8 (*)    AST 103 (*)    ALT 57 (*)    Total Bilirubin 1.3 (*)    All other components within normal limits  CBC - Abnormal; Notable for the following:    MCV 101.9 (*)    MCH 35.7 (*)    Platelets 78 (*)    All other components within normal limits  URINALYSIS, COMPLETE (UACMP) WITH MICROSCOPIC - Abnormal; Notable for the following:    Color, Urine AMBER (*)    APPearance CLEAR (*)      Glucose, UA 50 (*)    Protein, ur 30 (*)    Leukocytes, UA TRACE (*)    Bacteria, UA RARE (*)    Squamous Epithelial / LPF 6-30 (*)    All other components within normal limits  AMMONIA -  Abnormal; Notable for the following:    Ammonia 77 (*)    All other components within normal limits  LIPASE, BLOOD  DIFFERENTIAL   ____________________________________________  EKG   ____________________________________________  RADIOLOGY  Ct Abdomen Pelvis W Contrast  Result Date: 10/02/2016 CLINICAL DATA:  Right lower quadrant abdominal pain. EXAM: CT ABDOMEN AND PELVIS WITH CONTRAST TECHNIQUE: Multidetector CT imaging of the abdomen and pelvis was performed using the standard protocol following bolus administration of intravenous contrast. CONTRAST:  148m ISOVUE-300 IOPAMIDOL (ISOVUE-300) INJECTION 61% COMPARISON:  CT scan of September 28, 2016. FINDINGS: Lower chest: No acute abnormality. Hepatobiliary: Status post cholecystectomy. Nodular hepatic contours are noted consistent with hepatic cirrhosis. Stable hepatic cysts are noted. Small amount a gas is seen in the common bile duct and left hepatic ducts most likely due to incompetence of sphincter of Oddi. Pancreas: Unremarkable. No pancreatic ductal dilatation or surrounding inflammatory changes. Spleen: Moderate splenomegaly is noted without focal abnormality. Adrenals/Urinary Tract: Adrenal glands are unremarkable. Kidneys are normal, without renal calculi, focal lesion, or hydronephrosis. Bladder is unremarkable. Stomach/Bowel: Stomach is within normal limits. Appendix appears normal. No evidence of bowel wall thickening, distention, or inflammatory changes. Vascular/Lymphatic: Aortic atherosclerosis. No enlarged abdominal or pelvic lymph nodes. Reproductive: Status post hysterectomy. No adnexal masses. Other: No abdominal wall hernia or abnormality. No abdominopelvic ascites. Musculoskeletal: No acute or significant osseous findings. IMPRESSION:  Hepatic cirrhosis with moderate splenomegaly. Normal appendix. Aortic atherosclerosis. Electronically Signed   By: JMarijo Conception M.D.   On: 10/02/2016 21:08   CT read by radiology is really is no acute disease chest the known hepatic stress his splenomegaly ____________________________________________   PROCEDURES  Procedure(s) performed:  Procedures  Critical Care performed:  ____________________________________________   INITIAL IMPRESSION / ASSESSMENT AND PLAN / ED COURSE  Pertinent labs & imaging results that were available during my care of the patient were reviewed by me and considered in my medical decision making (see chart for details).  on discharge patient is feeling better and is happy to go home      ____________________________________________   FINAL CLINICAL IMPRESSION(S) / ED DIAGNOSES  Final diagnoses:  Abdominal pain, unspecified abdominal location      NEW MEDICATIONS STARTED DURING THIS VISIT:  New Prescriptions   OXYCODONE (OXY IR/ROXICODONE) 5 MG IMMEDIATE RELEASE TABLET    Take 1 tablet (5 mg total) by mouth every 4 (four) hours as needed for severe pain.     Note:  This document was prepared using Dragon voice recognition software and may include unintentional dictation errors.    MNena Polio MD 10/02/16 [id number]

## 2016-10-03 LAB — CULTURE, BLOOD (ROUTINE X 2)
Culture: NO GROWTH
Culture: NO GROWTH
Special Requests: ADEQUATE

## 2016-10-21 ENCOUNTER — Inpatient Hospital Stay
Admission: EM | Admit: 2016-10-21 | Discharge: 2016-10-26 | DRG: 441 | Disposition: A | Discharge status: Home / Self Care | Payer: Medicaid Other | Attending: Internal Medicine | Admitting: Internal Medicine

## 2016-10-21 ENCOUNTER — Emergency Department: Payer: Medicaid Other

## 2016-10-21 DIAGNOSIS — I10 Essential (primary) hypertension: Secondary | ICD-10-CM | POA: Diagnosis present

## 2016-10-21 DIAGNOSIS — E872 Acidosis: Secondary | ICD-10-CM | POA: Diagnosis present

## 2016-10-21 DIAGNOSIS — K7290 Hepatic failure, unspecified without coma: Secondary | ICD-10-CM

## 2016-10-21 DIAGNOSIS — R21 Rash and other nonspecific skin eruption: Secondary | ICD-10-CM | POA: Diagnosis present

## 2016-10-21 DIAGNOSIS — K7460 Unspecified cirrhosis of liver: Secondary | ICD-10-CM | POA: Diagnosis present

## 2016-10-21 DIAGNOSIS — D6959 Other secondary thrombocytopenia: Secondary | ICD-10-CM | POA: Diagnosis present

## 2016-10-21 DIAGNOSIS — E662 Morbid (severe) obesity with alveolar hypoventilation: Secondary | ICD-10-CM | POA: Diagnosis present

## 2016-10-21 DIAGNOSIS — M818 Other osteoporosis without current pathological fracture: Secondary | ICD-10-CM | POA: Diagnosis present

## 2016-10-21 DIAGNOSIS — F329 Major depressive disorder, single episode, unspecified: Secondary | ICD-10-CM | POA: Diagnosis present

## 2016-10-21 DIAGNOSIS — G894 Chronic pain syndrome: Secondary | ICD-10-CM | POA: Diagnosis present

## 2016-10-21 DIAGNOSIS — J441 Chronic obstructive pulmonary disease with (acute) exacerbation: Secondary | ICD-10-CM

## 2016-10-21 DIAGNOSIS — Z6841 Body Mass Index (BMI) 40.0 and over, adult: Secondary | ICD-10-CM

## 2016-10-21 DIAGNOSIS — K7200 Acute and subacute hepatic failure without coma: Secondary | ICD-10-CM | POA: Diagnosis present

## 2016-10-21 DIAGNOSIS — Q780 Osteogenesis imperfecta: Secondary | ICD-10-CM

## 2016-10-21 DIAGNOSIS — J9602 Acute respiratory failure with hypercapnia: Secondary | ICD-10-CM | POA: Diagnosis present

## 2016-10-21 DIAGNOSIS — Z79899 Other long term (current) drug therapy: Secondary | ICD-10-CM

## 2016-10-21 DIAGNOSIS — R4182 Altered mental status, unspecified: Secondary | ICD-10-CM

## 2016-10-21 DIAGNOSIS — Z8673 Personal history of transient ischemic attack (TIA), and cerebral infarction without residual deficits: Secondary | ICD-10-CM

## 2016-10-21 DIAGNOSIS — E119 Type 2 diabetes mellitus without complications: Secondary | ICD-10-CM | POA: Diagnosis present

## 2016-10-21 DIAGNOSIS — K7682 Hepatic encephalopathy: Secondary | ICD-10-CM | POA: Diagnosis present

## 2016-10-21 DIAGNOSIS — G9341 Metabolic encephalopathy: Secondary | ICD-10-CM | POA: Diagnosis present

## 2016-10-21 DIAGNOSIS — B952 Enterococcus as the cause of diseases classified elsewhere: Secondary | ICD-10-CM | POA: Diagnosis present

## 2016-10-21 DIAGNOSIS — Z9049 Acquired absence of other specified parts of digestive tract: Secondary | ICD-10-CM

## 2016-10-21 DIAGNOSIS — E039 Hypothyroidism, unspecified: Secondary | ICD-10-CM | POA: Diagnosis present

## 2016-10-21 DIAGNOSIS — M069 Rheumatoid arthritis, unspecified: Secondary | ICD-10-CM | POA: Diagnosis present

## 2016-10-21 DIAGNOSIS — K7581 Nonalcoholic steatohepatitis (NASH): Secondary | ICD-10-CM | POA: Diagnosis present

## 2016-10-21 DIAGNOSIS — Z794 Long term (current) use of insulin: Secondary | ICD-10-CM

## 2016-10-21 DIAGNOSIS — Z90710 Acquired absence of both cervix and uterus: Secondary | ICD-10-CM

## 2016-10-21 DIAGNOSIS — N390 Urinary tract infection, site not specified: Secondary | ICD-10-CM | POA: Diagnosis present

## 2016-10-21 DIAGNOSIS — K219 Gastro-esophageal reflux disease without esophagitis: Secondary | ICD-10-CM | POA: Diagnosis present

## 2016-10-21 DIAGNOSIS — Z79890 Hormone replacement therapy (postmenopausal): Secondary | ICD-10-CM

## 2016-10-21 DIAGNOSIS — Z8249 Family history of ischemic heart disease and other diseases of the circulatory system: Secondary | ICD-10-CM

## 2016-10-21 DIAGNOSIS — J9601 Acute respiratory failure with hypoxia: Secondary | ICD-10-CM | POA: Diagnosis present

## 2016-10-21 DIAGNOSIS — Z8542 Personal history of malignant neoplasm of other parts of uterus: Secondary | ICD-10-CM

## 2016-10-21 DIAGNOSIS — F419 Anxiety disorder, unspecified: Secondary | ICD-10-CM | POA: Diagnosis present

## 2016-10-21 DIAGNOSIS — Z9119 Patient's noncompliance with other medical treatment and regimen: Secondary | ICD-10-CM

## 2016-10-21 LAB — COOXEMETRY PANEL
Carboxyhemoglobin: 3.4 % — ABNORMAL HIGH (ref 0.5–1.5)
Methemoglobin: 1.3 % (ref 0.0–1.5)
O2 Saturation: 91.3 %
Total oxygen content: 90 mL/dL

## 2016-10-21 LAB — URINE DRUG SCREEN, QUALITATIVE (ARMC ONLY)
Amphetamines, Ur Screen: NOT DETECTED
Barbiturates, Ur Screen: NOT DETECTED
Benzodiazepine, Ur Scrn: POSITIVE — AB
Cannabinoid 50 Ng, Ur ~~LOC~~: NOT DETECTED
Cocaine Metabolite,Ur ~~LOC~~: NOT DETECTED
MDMA (Ecstasy)Ur Screen: NOT DETECTED
Methadone Scn, Ur: NOT DETECTED
Opiate, Ur Screen: NOT DETECTED
Phencyclidine (PCP) Ur S: NOT DETECTED
Tricyclic, Ur Screen: POSITIVE — AB

## 2016-10-21 LAB — CBC
HCT: 40.5 % (ref 35.0–47.0)
Hemoglobin: 13.9 g/dL (ref 12.0–16.0)
MCH: 34.7 pg — ABNORMAL HIGH (ref 26.0–34.0)
MCHC: 34.4 g/dL (ref 32.0–36.0)
MCV: 100.8 fL — ABNORMAL HIGH (ref 80.0–100.0)
Platelets: 80 10*3/uL — ABNORMAL LOW (ref 150–440)
RBC: 4.02 MIL/uL (ref 3.80–5.20)
RDW: 13.2 % (ref 11.5–14.5)
WBC: 3.6 10*3/uL (ref 3.6–11.0)

## 2016-10-21 LAB — BLOOD GAS, ARTERIAL
Acid-Base Excess: 2.7 mmol/L — ABNORMAL HIGH (ref 0.0–2.0)
Bicarbonate: 31.3 mmol/L — ABNORMAL HIGH (ref 20.0–28.0)
O2 Saturation: 91.3 %
Patient temperature: 37
pCO2 arterial: 65 mmHg — ABNORMAL HIGH (ref 32.0–48.0)
pH, Arterial: 7.29 — ABNORMAL LOW (ref 7.350–7.450)
pO2, Arterial: 69 mmHg — ABNORMAL LOW (ref 83.0–108.0)

## 2016-10-21 LAB — COMPREHENSIVE METABOLIC PANEL
ALT: 58 U/L — ABNORMAL HIGH (ref 14–54)
AST: 99 U/L — ABNORMAL HIGH (ref 15–41)
Albumin: 4.2 g/dL (ref 3.5–5.0)
Alkaline Phosphatase: 74 U/L (ref 38–126)
Anion gap: 10 (ref 5–15)
BUN: 9 mg/dL (ref 6–20)
CO2: 28 mmol/L (ref 22–32)
Calcium: 9.8 mg/dL (ref 8.9–10.3)
Chloride: 100 mmol/L — ABNORMAL LOW (ref 101–111)
Creatinine, Ser: 0.64 mg/dL (ref 0.44–1.00)
GFR calc Af Amer: >60 mL/min (ref >60)
GFR calc non Af Amer: >60 mL/min (ref >60)
Glucose, Bld: 201 mg/dL — ABNORMAL HIGH (ref 65–99)
Potassium: 3.7 mmol/L (ref 3.5–5.1)
Sodium: 138 mmol/L (ref 135–145)
Total Bilirubin: 2 mg/dL — ABNORMAL HIGH (ref 0.3–1.2)
Total Protein: 7.5 g/dL (ref 6.5–8.1)

## 2016-10-21 LAB — SALICYLATE LEVEL: Salicylate Lvl: <7 mg/dL (ref 2.8–30.0)

## 2016-10-21 LAB — GLUCOSE, CAPILLARY
Glucose-Capillary: 198 mg/dL — ABNORMAL HIGH (ref 65–99)
Glucose-Capillary: 238 mg/dL — ABNORMAL HIGH (ref 65–99)

## 2016-10-21 LAB — AMMONIA: Ammonia: 52 umol/L — ABNORMAL HIGH (ref 9–35)

## 2016-10-21 LAB — ACETAMINOPHEN LEVEL: Acetaminophen (Tylenol), Serum: <10 ug/mL — ABNORMAL LOW (ref 10–30)

## 2016-10-21 LAB — ETHANOL: Alcohol, Ethyl (B): <10 mg/dL (ref <10)

## 2016-10-21 MED ORDER — ONDANSETRON HCL 4 MG PO TABS: 4.0000 mg | ORAL_TABLET | Freq: Four times a day (QID) | ORAL | Status: DC | PRN

## 2016-10-21 MED ORDER — SODIUM CHLORIDE 0.9 % IV SOLN
INTRAVENOUS | Status: DC
  Administered 2016-10-21: 21:00:00 via INTRAVENOUS

## 2016-10-21 MED ORDER — LACTULOSE 10 GM/15ML PO SOLN: 20.0000 g | Freq: Two times a day (BID) | ORAL | Status: DC

## 2016-10-21 MED ORDER — HEPARIN SODIUM (PORCINE) 5000 UNIT/ML IJ SOLN: 5000.0000 [IU] | Freq: Three times a day (TID) | INTRAMUSCULAR | Status: DC

## 2016-10-21 MED ORDER — ORAL CARE MOUTH RINSE
15.0000 mL | Freq: Two times a day (BID) | OROMUCOSAL | Status: DC
  Administered 2016-10-22 – 2016-10-26 (×8): 15 mL via OROMUCOSAL

## 2016-10-21 MED ORDER — ONDANSETRON HCL 4 MG/2ML IJ SOLN
4.0000 mg | Freq: Four times a day (QID) | INTRAMUSCULAR | Status: DC | PRN
  Administered 2016-10-23: 4 mg via INTRAVENOUS
  Filled 2016-10-21: qty 2

## 2016-10-21 MED ORDER — CHLORHEXIDINE GLUCONATE 0.12 % MT SOLN
15.0000 mL | Freq: Two times a day (BID) | OROMUCOSAL | Status: DC
  Administered 2016-10-22 – 2016-10-26 (×10): 15 mL via OROMUCOSAL
  Filled 2016-10-21 (×9): qty 15

## 2016-10-21 MED ORDER — ALBUTEROL SULFATE HFA 108 (90 BASE) MCG/ACT IN AERS: 2.0000 | INHALATION_SPRAY | Freq: Four times a day (QID) | RESPIRATORY_TRACT | Status: DC | PRN

## 2016-10-21 MED ORDER — ALBUTEROL SULFATE (2.5 MG/3ML) 0.083% IN NEBU: 2.5000 mg | INHALATION_SOLUTION | Freq: Four times a day (QID) | RESPIRATORY_TRACT | Status: DC | PRN

## 2016-10-21 MED ORDER — BISACODYL 5 MG PO TBEC: 5.0000 mg | DELAYED_RELEASE_TABLET | Freq: Every day | ORAL | Status: DC | PRN

## 2016-10-21 MED ORDER — LACTULOSE ENEMA
300.0000 mL | Freq: Once | ORAL | Status: AC
  Administered 2016-10-21: 300 mL via RECTAL
  Filled 2016-10-21: qty 300

## 2016-10-21 NOTE — ED Provider Notes (Addendum)
[facility] Emergency Department Provider Note  ____________________________________________   I have reviewed the triage vital signs and the nursing notes.   HISTORY  Chief Complaint Altered Mental Status    HPI Lori Mckenzie is a 51 y.o. female With a history of chronic kidney disease, hepatic encephalopathy in the past, COPD, diabetes mellitus, reflux disease, chronic pain and cirrhosis who presents today feeling sleepy. Patient states she's been feeling somewhat sleepy for the last couple days. She denies any fever or chills nausea vomiting or CO exposure. She denies any family members with similar. She states she ran out of her lactulose and hasn't taken until this morning. She states that is probably why she is sleepy. She denies any closed head injury or fall to the extent that she can remember. Patient is very noted a strain however,Level 5 chart caveat; no further history available due to patient status.     Past Medical History:  Diagnosis Date  . Abdominal abscess 08/25/2014  . Acid reflux 08/10/2010  . Acute cervical myofascial strain 02/09/2016  . Anxiety   . Ascites   . Asthma   . Back pain   . Bile leak, postoperative 07/17/2014  . Brittle bone disease   . Cancer ([place])    Uteriine  ca 53yr ago partial hysterectomy  . Cervical disc disease   . Chronic kidney disease   . Collagen vascular disease ([place]    RA  3-4 yrs ago  . COPD (chronic obstructive pulmonary disease) ([place]   . Diabetes mellitus without complication ([place]   . GERD (gastroesophageal reflux disease)   . Hypertension   . Hypothyroidism   . Left upper quadrant pain 01/09/2014  . Major depressive disorder with single episode 12/05/2011  . Major depressive disorder, single episode 12/05/2011  . Migraines   . NASH (nonalcoholic steatohepatitis)   . Respiratory infection    2/17  . Shock ([place] 09/18/2014  . Sleep apnea   . Sleep apnea   . Syncope 11/16/2014  . Thyroid disease   .  TIA (transient ischemic attack)     Patient Active Problem List   Diagnosis Date Noted  . Acute hepatic encephalopathy 09/23/2016  . Acute postoperative pain 08/09/2016  . Low back pain due to L1-2 disc extrusion (caudal) (Left) 07/11/2016  . Abdominal pain 06/28/2016  . Constipation 06/20/2016  . Hyperglycemia 06/20/2016  . Nausea without vomiting 06/06/2016  . Chronic pain of lower extremity, bilateral 05/19/2016  . Spinal stenosis, thoracic region (T10-11) 05/19/2016  . Diabetes mellitus, insulin dependent (IDDM), uncontrolled ([place] 04/12/2016  . Chronic sacroiliac joint pain (Bilateral) (L>R) 03/23/2016  . Chronic pain of left upper extremity 03/10/2016  . Chronic radicular cervical pain (L) 03/10/2016  . Osteoarthritis 02/24/2016  . Allodynia 02/09/2016  . Chronic pain syndrome 01/07/2016  . Opiate withdrawal ([place] 12/22/2015  . Elevated liver enzymes 09/28/2015  . Cirrhosis of liver without ascites ([place] 09/24/2015  . Cirrhosis ([place] 09/24/2015  . Altered mental status 07/24/2015  . Uncontrolled diabetes mellitus ([place] 07/24/2015  . Radicular pain of thoracic region 04/06/2015  . Hepatic encephalopathy ([place] 03/11/2015  . Elevated sedimentation rate 03/05/2015  . Elevated C-reactive protein (CRP) 03/05/2015  . Lumbar facet syndrome (Location of Primary Source of Pain) (Bilateral) (R>L) 03/05/2015  . Cervical spondylosis 03/05/2015  . Chronic feet pain (Location of Secondary source of pain) (Bilateral) (R>L) 03/05/2015  . Lumbar spondylosis 03/05/2015  . Encounter for chronic pain management 03/05/2015  . Chronic shoulder pain (Bilateral) (R>L) 03/05/2015  .  Chronic carpal tunnel syndrome (Bilateral) 03/05/2015  . Chronic hip pain (Bilateral) (L>R) 03/05/2015  . Chronic upper back pain (Bilateral) (L>R) 03/05/2015  . Osteoporosis, idiopathic 03/05/2015  . Abnormal MRI, lumbar spine (02/03/2015) 03/05/2015  . Thoracic radiculitis 02/05/2015  . Vitamin D deficiency  01/13/2015  . B12 deficiency 01/13/2015  . Folate deficiency 01/13/2015  . Subacute lumbar radiculopathy (left side) (S1 dermatome) 12/10/2014  . Drowsiness 11/16/2014  . Episode of syncope 11/16/2014  . Somnolence 11/16/2014  . Opiate use (75 MME/Day) 10/28/2014  . Long term prescription opiate use 10/28/2014  . Long term current use of opiate analgesic 10/28/2014  . Encounter for therapeutic drug level monitoring 10/28/2014  . Chronic epigastric abdominal pain 10/28/2014  . Chronic low back pain (Location of Primary Source of Pain) (Bilateral) (R>L) 10/28/2014  . Chronic neck pain (Location of Tertiary source of pain) (Bilateral) (R>L) 10/28/2014  . Ascites 09/05/2014  . NASH (nonalcoholic steatohepatitis) 09/05/2014  . Hypokalemia 09/05/2014  . Dysthymia 08/05/2014  . Other social stressor 08/05/2014  . Steatohepatitis 07/15/2014  . Type 2 diabetes mellitus ([place]) 07/12/2014  . COPD (chronic obstructive pulmonary disease) ([place]) 07/12/2014  . GERD (gastroesophageal reflux disease) 07/12/2014  . OSA on CPAP 07/12/2014  . Anxiety 07/12/2014  . 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 Lumbar and sacral osteoarthritis 03/26/2014  . Myofascial pain 03/26/2014  . Lumbar spinal stenosis 03/26/2014  . Lumbosacral spondylosis without myelopathy 03/26/2014  . 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 Spinal stenosis, lumbar region without neurogenic claudication (L1-2 and L4-5) 03/26/2014  . Spondylosis of lumbar region without myelopathy or radiculopathy 03/26/2014  . Breath shortness 01/09/2014  . Diastolic dysfunction 16/10/9602  . Airway hyperreactivity 08/06/2013  . Essential (primary) hypertension 08/06/2013  . Asthma 08/06/2013  . Clinical depression 12/05/2011    Past Surgical History:  Procedure Laterality Date  . ABDOMINAL HYSTERECTOMY    . CHOLECYSTECTOMY N/A 07/15/2014   Procedure: LAPAROSCOPIC CHOLECYSTECTOMY with liver biopsy ;  Surgeon: Sherri Rad, MD;   Location: [facility] ORS;  Service: General;  Laterality: N/A;  . COLONOSCOPY WITH PROPOFOL N/A 06/23/2014   Procedure: COLONOSCOPY WITH PROPOFOL;  Surgeon: Lollie Sails, MD;  Location: [facility] ENDOSCOPY;  Service: Endoscopy;  Laterality: N/A;  . ERCP N/A 07/16/2014   Procedure: ENDOSCOPIC RETROGRADE CHOLANGIOPANCREATOGRAPHY (ERCP);  Surgeon: Clarene Essex, MD;  Location: [facility] ENDOSCOPY;  Service: Endoscopy;  Laterality: N/A;  . ERCP N/A 10/03/2014   Procedure: ENDOSCOPIC RETROGRADE CHOLANGIOPANCREATOGRAPHY (ERCP);  Surgeon: Hulen Luster, MD;  Location[facility] ENDOSCOPY;  Service: Gastroenterology;  Laterality: N/A;  . ESOPHAGOGASTRODUODENOSCOPY N/A 06/23/2014   Procedure: ESOPHAGOGASTRODUODENOSCOPY (EGD);  Surgeon: Lollie Sails, MD;  Location: [facility] ENDOSCOPY;  Service: Endoscopy;  Laterality: N/A;  . ESOPHAGOGASTRODUODENOSCOPY N/A 06/30/2016   Procedure: ESOPHAGOGASTRODUODENOSCOPY (EGD);  Surgeon: Jonathon Bellows, MD;  Location: [facility] ENDOSCOPY;  Service: Endoscopy;  Laterality: N/A;  . ESOPHAGOGASTRODUODENOSCOPY (EGD) WITH PROPOFOL N/A 12/01/2015   Procedure: ESOPHAGOGASTRODUODENOSCOPY (EGD) WITH PROPOFOL;  Surgeon: Lollie Sails, MD;  Location: [facility] ENDOSCOPY;  Service: Endoscopy;  Laterality: N/A;  . TUBAL LIGATION    . WISDOM TOOTH EXTRACTION      Prior to Admission medications   Medication Sig Start Date End Date Taking? Authorizing Provider  albuterol (PROVENTIL HFA;VENTOLIN HFA) 108 (90 Base) MCG/ACT inhaler Inhale 2 puffs into the lungs every 6 (six) hours as needed for wheezing or shortness of breath.    [provider]  ALPRAZolam Duanne Moron) 1 MG tablet Take 1 mg by mouth 3 (three) times daily.  [provider]  cholecalciferol (VITAMIN D) 1000 units tablet Take 2,000 Units by mouth daily.    [provider]  citalopram (CELEXA) 20 MG tablet Take 30-40 mg by mouth daily.  07/24/15   [provider]  diclofenac sodium (VOLTAREN) 1 % GEL Apply 4 g topically 4  (four) times daily. 04/12/16 10/09/16  Milinda Pointer, MD  dicyclomine (BENTYL) 10 MG capsule Take 10 mg by mouth 2 (two) times daily.     [provider]  fluticasone (FLONASE) 50 MCG/ACT nasal spray Place 1-2 sprays into both nostrils daily.     [provider]  Folate-B12-Intrinsic Factor (INTRINSI B12-FOLATE) [phone] MCG-MCG-MG TABS Take 1 tablet by mouth daily. Reported on 04/06/2015    [provider]  furosemide (LASIX) 40 MG tablet Take 40 mg by mouth daily.     [provider]  gabapentin (NEURONTIN) 300 MG capsule Take 600 mg by mouth 4 (four) times daily.     [provider]  hydrochlorothiazide (MICROZIDE) 12.5 MG capsule Take 12.5 mg by mouth daily as needed. For high blood pressure    [provider]  ibuprofen (ADVIL,MOTRIN) 600 MG tablet Take 1 tablet (600 mg total) by mouth every 8 (eight) hours as needed. 09/28/16   Darel Hong, MD  insulin aspart (NOVOLOG) 100 UNIT/ML injection Inject 30-40 Units into the skin 3 (three) times daily before meals.    [provider]  insulin detemir (LEVEMIR) 100 UNIT/ML injection Inject 50 Units into the skin 2 (two) times daily.    [provider]  ipratropium-albuterol (DUONEB) 0.5-2.5 (3) MG/3ML SOLN Take 3 mLs by nebulization every 4 (four) hours as needed. For shortness of breath/wheezing    [provider]  lactulose (CHRONULAC) 10 GM/15ML solution Take 10 g by mouth 4 (four) times daily.     [provider]  nystatin (MYCOSTATIN) powder Apply 1 g topically 3 (three) times daily as needed. For irritation    [provider]  oxyCODONE (OXY IR/ROXICODONE) 5 MG immediate release tablet Take 1 tablet (5 mg total) by mouth every 4 (four) hours as needed for severe pain. 10/02/16   Nena Polio, MD  oxycodone (OXY-IR) 5 MG capsule Take 1 capsule (5 mg total) by mouth every 6 (six) hours as needed. 09/25/16   Dustin Flock, MD   oxyCODONE-acetaminophen (ROXICET) 5-325 MG tablet Take 1 tablet by mouth every 6 (six) hours as needed for severe pain. 09/28/16   Darel Hong, MD  pantoprazole (PROTONIX) 40 MG tablet Take 40 mg by mouth 2 (two) times daily before a meal.    [provider]  potassium chloride SA (K-DUR,KLOR-CON) 20 MEQ tablet Take 10-20 mEq by mouth daily as needed. For high blood pressure    [provider]  promethazine (PHENERGAN) 12.5 MG tablet Take 2 tablets (25 mg total) by mouth every 6 (six) hours as needed for nausea or vomiting. 09/25/16   Dustin Flock, MD  rifaximin (XIFAXAN) 550 MG TABS tablet Take 550 mg by mouth 2 (two) times daily.     [provider]  rizatriptan (MAXALT) 10 MG tablet Take 10 mg by mouth as needed for migraine.     [provider]  spironolactone (ALDACTONE) 100 MG tablet Take 100 mg by mouth daily.     [provider]  vitamin B-12 (CYANOCOBALAMIN) 1000 MCG tablet Take 2,000 mcg by mouth daily.    [provider]    Allergies Tape and Vicodin [hydrocodone-acetaminophen]  Family History  Problem Relation Age of Onset  . Lung cancer Mother   . Ulcers Father   . Emphysema Father   . Heart disease Sister   . Ulcers Sister   . Heart disease Brother     Social History Social History  Substance Use Topics  . Smoking status: Never Smoker  . Smokeless tobacco: Never Used  . Alcohol use No     Comment: occ    Review of Systems Constitutional: No fever/chills Eyes: No visual changes. ENT: No sore throat. No stiff neck no neck pain Cardiovascular: Denies chest pain. Respiratory: Denies shortness of breath. Gastrointestinal:   no vomiting.  No diarrhea.  No constipation. Genitourinary: Negative for dysuria. Musculoskeletal: Negative lower extremity swelling Skin: Negative for rash. Neurological: Negative for severe headaches, focal weakness or  numbness.   ____________________________________________   PHYSICAL EXAM:  VITAL SIGNS: ED Triage Vitals  Enc Vitals Group     BP 10/21/16 1627 140/90     Pulse Rate 10/21/16 1627 (!) 108     Resp 10/21/16 1627 13     Temp 10/21/16 1627 98.9 F (37.2 C)     Temp Source 10/21/16 1627 Oral     SpO2 10/21/16 1627 (!) 88 %     Weight 10/21/16 1627 270 lb (122.5 kg)     Height 10/21/16 1627 5' 2"  (1.575 m)     Head Circumference --      Peak Flow --      Pain Score 10/21/16 1626 9     Pain Loc --      Pain Edu? --      Excl. in [place]? --     Constitutional: patient is somnolent but arousable in guarding her airway. Eyes: Conjunctivae are normal Head: Atraumatic HEENT: No congestion/rhinnorhea. Mucous membranes are moist.  Oropharynx non-erythematous Neck:   Nontender with no meningismus, no masses, no stridor Cardiovascular: Normal rate, regular rhythm. Grossly normal heart sounds.  Good peripheral circulation. Respiratory: Normal respiratory effort.  No retractions. Lungs CTAB. Abdominal: Soft and nontender. No distention. No guarding no rebound Back:  There is no focal tenderness or step off.  there is no midline tenderness there are no lesions noted. there is no CVA tenderness Musculoskeletal: No lower extremity tenderness, no upper extremity tenderness. No joint effusions, no DVT signs strong distal pulses no edema Neurologic:  Normal speech and language. No gross focal neurologic deficits are appreciated.  Skin:  Skin is warm, dry and intact. No rash noted. Psychiatric: Mood and affect are normal. Speech and behavior are normal.  ____________________________________________   LABS (all labs ordered are listed, but only abnormal results are displayed)  Labs Reviewed  COMPREHENSIVE METABOLIC PANEL - Abnormal; Notable for the following:       Result Value   Chloride 100 (*)    Glucose, Bld 201 (*)    AST 99 (*)    ALT 58 (*)    Total Bilirubin 2.0 (*)    All other  components within normal limits  CBC - Abnormal; Notable for the following:    MCV 100.8 (*)    MCH 34.7 (*)    Platelets 80 (*)    All other components within normal limits  AMMONIA - Abnormal; Notable for the following:    Ammonia 52 (*)    All other components within normal limits  ACETAMINOPHEN LEVEL - Abnormal; Notable for the following:    Acetaminophen (Tylenol), Serum <10 (*)    All other components within normal limits  COOXEMETRY PANEL - Abnormal; Notable for the following:    Carboxyhemoglobin 3.4 (*)    All other components within normal limits  GLUCOSE, CAPILLARY - Abnormal; Notable for the following:    Glucose-Capillary 198 (*)    All other components within normal limits  BLOOD GAS, ARTERIAL - Abnormal; Notable for the following:    pH, Arterial 7.29 (*)    pCO2 arterial 65 (*)    pO2, Arterial 69 (*)    Bicarbonate 31.3 (*)    Acid-Base Excess 2.7 (*)    All other components within normal limits  URINE CULTURE  ETHANOL  SALICYLATE LEVEL  URINALYSIS, COMPLETE (UACMP) WITH MICROSCOPIC  URINE DRUG SCREEN, QUALITATIVE ([facility] ONLY)  CBG MONITORING, ED    Pertinent labs  results that were available during my care of the patient were reviewed by me and considered in my medical decision making (see chart for details). ____________________________________________  EKG  I personally interpreted any EKGs ordered by me or triage  ____________________________________________  RADIOLOGY  Pertinent labs & imaging results that were available during my care of the patient were reviewed by me and considered in my medical decision making (see chart for details). If possible, patient and/or family made aware of any abnormal findings. ____________________________________________    PROCEDURES  Procedure(s) performed: None  Procedures  Critical Care performed: None  ____________________________________________   INITIAL IMPRESSION / ASSESSMENT AND PLAN / ED  COURSE  Pertinent labs & imaging results that were available during my care of the patient were reviewed by me and considered in my medical decision making (see chart for details).  patient here with somnolence, most likely this is related to elevated ammonia levels, however I also checked carboxy hemolytic is apparently patient was in the car, that is unremarkable for a smoker, patient's ABG is not shows acute CO2 retention, I have obtained CT of the head which is pending and chest x-ray as a precaution. Most likely however her significant somnolence is secondary to her noncompliance with her lactulose  ----------------------------------------- 6:45 PM on 10/21/2016 -----------------------------------------  patient's likely multifactorial, she is somewhat acidotic with a respiratory acidosis which is unclear if that is the cause or the effect of her sedation. We'll start her on BiPAP to see if he can blow that down and we will admit her to the hospital. level is somewhat elevated as well but we will hold off on lactulose pending correction of pH given that she is somewhat somnolent ----------------------------------------- 8:46 PM on 10/21/2016 -----------------------------------------  Pt has been admitted x 2 hours. Still somnolent but rouses to voice and stimulation. uncer care of hospitalist.     ____________________________________________   FINAL CLINICAL IMPRESSION(S) / ED DIAGNOSES  Final diagnoses:  Altered mental status      This chart was dictated using voice recognition software.  Despite best efforts to proofread,  errors can occur which can change meaning.      Schuyler Amor, MD 10/21/16 1813    Schuyler Amor, MD 10/21/16 1845    Schuyler Amor, MD 10/21/16 2047

## 2016-10-21 NOTE — ED Triage Notes (Signed)
Pt to ED via [facility] c/o altered mental status. Per EMS pt was found with head down in car. EMS reports pt has hx of COPD and elevated ammonia levels. Upon arrival pt lethargic with slurred speech, sating at 88% on RA, placed on 2Ls.

## 2016-10-21 NOTE — ED Notes (Signed)
Attempt to call report without success.

## 2016-10-21 NOTE — ED Notes (Signed)
hospitalist in to see pt.

## 2016-10-21 NOTE — ED Notes (Signed)
Awaiting RT for transport

## 2016-10-21 NOTE — H&P (Signed)
[facility] [place] NAME: Lori Mckenzie    MR#:  [id number]  DATE OF BIRTH:  Oct 22, 1965  DATE OF ADMISSION:  10/21/2016  PRIMARY CARE PHYSICIAN: Ashkin, Neldon Labella, MD   REQUESTING/REFERRING PHYSICIAN: Dr. Burlene Arnt  CHIEF COMPLAINT:altered mental status   Chief Complaint  Patient presents with  . Altered Mental Status    HISTORY OF PRESENT ILLNESS:  Lori Mckenzie  is a 51 y.o. female with a known history of liver cirrhosis came in by ambulance because of altered mental status. Son had decided down in the car. Patient was lethargic with slurred speech, saturation 88% on room air. Patient found to havePh  7.29, PCO2 65.at the intensive care unit for BiPAP, discussed with intensivist on call   PAST MEDICAL HISTORY:   Past Medical History:  Diagnosis Date  . Abdominal abscess 08/25/2014  . Acid reflux 08/10/2010  . Acute cervical myofascial strain 02/09/2016  . Anxiety   . Ascites   . Asthma   . Back pain   . Bile leak, postoperative 07/17/2014  . Brittle bone disease   . Cancer ([place])    Uteriine  ca 47yr ago partial hysterectomy  . Cervical disc disease   . Chronic kidney disease   . Collagen vascular disease ([place]    RA  3-4 yrs ago  . COPD (chronic obstructive pulmonary disease) ([place]   . Diabetes mellitus without complication ([place]   . GERD (gastroesophageal reflux disease)   . Hypertension   . Hypothyroidism   . Left upper quadrant pain 01/09/2014  . Major depressive disorder with single episode 12/05/2011  . Major depressive disorder, single episode 12/05/2011  . Migraines   . NASH (nonalcoholic steatohepatitis)   . Respiratory infection    2/17  . Shock ([place] 09/18/2014  . Sleep apnea   . Sleep apnea   . Syncope 11/16/2014  . Thyroid disease   . TIA (transient ischemic attack)     PAST SURGICAL HISTOIRY:   Past Surgical History:  Procedure Laterality Date  . ABDOMINAL HYSTERECTOMY    . CHOLECYSTECTOMY N/A 07/15/2014    Procedure: LAPAROSCOPIC CHOLECYSTECTOMY with liver biopsy ;  Surgeon: MSherri Rad MD;  Location: [facility] ORS;  Service: General;  Laterality: N/A;  . COLONOSCOPY WITH PROPOFOL N/A 06/23/2014   Procedure: COLONOSCOPY WITH PROPOFOL;  Surgeon: MLollie Sails MD;  [facility];  Service: Endoscopy;  Laterality: N/A;  . ERCP N/A 07/16/2014   Procedure: ENDOSCOPIC RETROGRADE CHOLANGIOPANCREATOGRAPHY (ERCP);  Surgeon: MClarene Essex MD;  [facility];  Service: Endoscopy;  Laterality: N/A;  . ERCP N/A 10/03/2014   Procedure: ENDOSCOPIC RETROGRADE CHOLANGIOPANCREATOGRAPHY (ERCP);  Surgeon: PHulen Luster MD;  [facility];  Service: Gastroenterology;  Laterality: N/A;  . ESOPHAGOGASTRODUODENOSCOPY N/A 06/23/2014   Procedure: ESOPHAGOGASTRODUODENOSCOPY (EGD);  Surgeon: MLollie Sails MD;  [facility];  Service: Endoscopy;  Laterality: N/A;  . ESOPHAGOGASTRODUODENOSCOPY N/A 06/30/2016   Procedure: ESOPHAGOGASTRODUODENOSCOPY (EGD);  Surgeon: [name] MD;  [facility];  Service: Endoscopy;  Laterality: N/A;  . ESOPHAGOGASTRODUODENOSCOPY (EGD) WITH PROPOFOL N/A 12/01/2015   Procedure: ESOPHAGOGASTRODUODENOSCOPY (EGD) WITH PROPOFOL;  Surgeon: MLollie Sails MD;  [facility];  Service: Endoscopy;  Laterality: N/A;  . TUBAL LIGATION    . WISDOM TOOTH EXTRACTION      SOCIAL HISTORY:   Social History  Substance Use Topics  . Smoking status: Never Smoker  . Smokeless tobacco: Never Used  . Alcohol use No  Comment: occ    FAMILY HISTORY:   Family History  Problem Relation Age of Onset  . Lung cancer Mother   . Ulcers Father   . Emphysema Father   . Heart disease Sister   . Ulcers Sister   . Heart disease Brother     DRUG ALLERGIES:   Allergies  Allergen Reactions  . Tape Swelling  . Vicodin [Hydrocodone-Acetaminophen] Itching and Rash    REVIEW OF SYSTEMS:  Unable to obtain review of systems because patient is  altered.  MEDICATIONS AT HOME:   Prior to Admission medications   Medication Sig Start Date End Date Taking? Authorizing Provider  albuterol (PROVENTIL HFA;VENTOLIN HFA) 108 (90 Base) MCG/ACT inhaler Inhale 2 puffs into the lungs every 6 (six) hours as needed for wheezing or shortness of breath.    [provider]  ALPRAZolam Duanne Moron) 1 MG tablet Take 1 mg by mouth 3 (three) times daily.     [provider]  cholecalciferol (VITAMIN D) 1000 units tablet Take 2,000 Units by mouth daily.    [provider]  citalopram (CELEXA) 20 MG tablet Take 30-40 mg by mouth daily.  07/24/15   [provider]  diclofenac sodium (VOLTAREN) 1 % GEL Apply 4 g topically 4 (four) times daily. 04/12/16 10/09/16  Milinda Pointer, MD  dicyclomine (BENTYL) 10 MG capsule Take 10 mg by mouth 2 (two) times daily.     [provider]  fluticasone (FLONASE) 50 MCG/ACT nasal spray Place 1-2 sprays into both nostrils daily.     [provider]  Folate-B12-Intrinsic Factor (INTRINSI B12-FOLATE) [phone] MCG-MCG-MG TABS Take 1 tablet by mouth daily. Reported on 04/06/2015    [provider]  furosemide (LASIX) 40 MG tablet Take 40 mg by mouth daily.     [provider]  gabapentin (NEURONTIN) 300 MG capsule Take 600 mg by mouth 4 (four) times daily.     [provider]  hydrochlorothiazide (MICROZIDE) 12.5 MG capsule Take 12.5 mg by mouth daily as needed. For high blood pressure    [provider]  ibuprofen (ADVIL,MOTRIN) 600 MG tablet Take 1 tablet (600 mg total) by mouth every 8 (eight) hours as needed. 09/28/16   Darel Hong, MD  insulin aspart (NOVOLOG) 100 UNIT/ML injection Inject 30-40 Units into the skin 3 (three) times daily before meals.    [provider]  insulin detemir (LEVEMIR) 100 UNIT/ML injection Inject 50 Units into the skin 2 (two) times daily.    [provider]  ipratropium-albuterol (DUONEB)  0.5-2.5 (3) MG/3ML SOLN Take 3 mLs by nebulization every 4 (four) hours as needed. For shortness of breath/wheezing    [provider]  lactulose (CHRONULAC) 10 GM/15ML solution Take 10 g by mouth 4 (four) times daily.     [provider]  nystatin (MYCOSTATIN) powder Apply 1 g topically 3 (three) times daily as needed. For irritation    [provider]  oxyCODONE (OXY IR/ROXICODONE) 5 MG immediate release tablet Take 1 tablet (5 mg total) by mouth every 4 (four) hours as needed for severe pain. 10/02/16   Nena Polio, MD  oxycodone (OXY-IR) 5 MG capsule Take 1 capsule (5 mg total) by mouth every 6 (six) hours as needed. 09/25/16   Dustin Flock, MD  oxyCODONE-acetaminophen (ROXICET) 5-325 MG tablet Take 1 tablet by mouth every 6 (six) hours as needed for severe pain. 09/28/16   Darel Hong, MD  pantoprazole (PROTONIX) 40 MG tablet Take 40 mg by mouth  2 (two) times daily before a meal.    [provider]  potassium chloride SA (K-DUR,KLOR-CON) 20 MEQ tablet Take 10-20 mEq by mouth daily as needed. For high blood pressure    [provider]  promethazine (PHENERGAN) 12.5 MG tablet Take 2 tablets (25 mg total) by mouth every 6 (six) hours as needed for nausea or vomiting. 09/25/16   Dustin Flock, MD  rifaximin (XIFAXAN) 550 MG TABS tablet Take 550 mg by mouth 2 (two) times daily.     [provider]  rizatriptan (MAXALT) 10 MG tablet Take 10 mg by mouth as needed for migraine.     [provider]  spironolactone (ALDACTONE) 100 MG tablet Take 100 mg by mouth daily.     [provider]  vitamin B-12 (CYANOCOBALAMIN) 1000 MCG tablet Take 2,000 mcg by mouth daily.    [provider]      VITAL SIGNS:  Blood pressure 113/68, pulse 98, temperature 98.9 F (37.2 C), temperature source Oral, resp. rate 11, height 5' 2"  (1.575 m), weight 122.5 kg (270 lb), SpO2 94 %.  PHYSICAL EXAMINATION:  GENERAL:  51 y.o.-year-old  patient lying in the bed wBiPAP on. EYES: Pupils equal, round, reactive to light . No scleral icterus. HEENT: Head atraumatic, normocephalic. Oropharynx and nasopharynx clear.  NECK:  Supple, no jugular venous distention. No thyroid enlargement, no tenderness.  LUNGS: Normal breath sounds bilaterally, no wheezing, rales,rhonchi or crepitation. No use of accessory muscles of respiration.  CARDIOVASCULAR: S1, S2 normal. No murmurs, rubs, or gallops.  ABDOMEN: Soft, nontender, nondistended. Bowel sounds present. No organomegaly or mass.  EXTREMITIES: No pedal edema, cyanosis, or clubbing.  NEUROLOGIC: unable to obtain review of systems because of altered mental status. PSYCHIATRIC:  Pt is lethargic.Marland Kitchen  SKIN: No obvious rash, lesion, or ulcer.   LABORATORY PANEL:   CBC  Recent Labs Lab 10/21/16 1635  WBC 3.6  HGB 13.9  HCT 40.5  PLT 80*   ------------------------------------------------------------------------------------------------------------------  Chemistries   Recent Labs Lab 10/21/16 1635  NA 138  K 3.7  CL 100*  CO2 28  GLUCOSE 201*  BUN 9  CREATININE 0.64  CALCIUM 9.8  AST 99*  ALT 58*  ALKPHOS 74  BILITOT 2.0*   ------------------------------------------------------------------------------------------------------------------  Cardiac Enzymes No results for input(s): TROPONINI in the last 168 hours. ------------------------------------------------------------------------------------------------------------------  RADIOLOGY:  Ct Head Wo Contrast  Result Date: 10/21/2016 CLINICAL DATA:  Altered mental status EXAM: CT HEAD WITHOUT CONTRAST TECHNIQUE: Contiguous axial images were obtained from the base of the skull through the vertex without intravenous contrast. COMPARISON:  09/23/2016 FINDINGS: Brain: No evidence of acute infarction, hemorrhage, hydrocephalus, extra-axial collection or mass lesion/mass effect. Vascular: No hyperdense vessel or unexpected  calcification. Skull: Normal. Negative for fracture or focal lesion. Sinuses/Orbits: No acute finding. Other: None IMPRESSION: No CT evidence for acute intracranial abnormality. Electronically Signed   By: Donavan Foil M.D.   On: 10/21/2016 18:00   Dg Chest Port 1 View  Result Date: 10/21/2016 CLINICAL DATA:  Elevated ammonia level. Ascites. Lethargy with slurred speech. EXAM: PORTABLE CHEST 1 VIEW COMPARISON:  Chest radiograph September 28, 2016 FINDINGS: Cardiac silhouette is mildly enlarged, mediastinal silhouette is unremarkable for this low inspiratory examination with crowded engorged vasculature markings. The lungs are clear without pleural effusions or focal consolidations. Trachea projects midline and there is no pneumothorax. Included soft tissue planes and osseous structures are non-suspicious. IMPRESSION: Mild cardiomegaly and pulmonary vascular congestion in this low inspiratory portable examination. Electronically Signed  By: Elon Alas M.D.   On: 10/21/2016 18:11    EKG:   Orders placed or performed during the hospital encounter of 10/21/16  . EKG 12-Lead  . EKG 12-Lead    IMPRESSION AND PLAN:  Altered mental status secondary to hypercapnic respiratory failure,slight hepatic encephalopathy. Admitted to ICU for BiPAP support. Continue nothing by mouth,use lactulose enemas until she becomes alert. #2/.nonalcoholic Liver cirrhosis.hold the Lasix, Aldactone because patient is altered at this time, use IV Lasix from tomorrow if she becomes more alert. #3. history of anxiety, depression hold medications of depression including Lexapro, Xanax For history of COPD: No wheezing .continuealbuterol nebulizer as needed for wheezing. D/w [facility] ICU physician on call, All the records are reviewed and case discussed with ED provider. Management plans discussed with the patient, family and they are in agreement.  CODE STATUS: full  TOTAL TIME TAKING CARE OF THIS PATIENT: 55 minutes.     Epifanio Lesches M.D on 10/21/2016 at 7:31 PM  Between 7am to 6pm - Pager - [phone]  After 6pm go to [email or web address] - password [email or web address] [facility]  Office  [phone]  CC: Primary care physician; Ashkin, Neldon Labella, MD  Note: This dictation was prepared with Dragon dictation along with smaller phrase technology. Any transcriptional errors that result from this process are unintentional.

## 2016-10-21 NOTE — ED Notes (Signed)
Pt arousable to voice. Pt opens eyes briefly to voice command, but just grunts when asked questions or states "yes" to question, but does not elaborate on question. perrl sluggish 1m. Skin pwd. nsr on cardiac monitor.

## 2016-10-21 NOTE — ED Notes (Signed)
Report from jerri, rn.

## 2016-10-22 DIAGNOSIS — F1320 Sedative, hypnotic or anxiolytic dependence, uncomplicated: Secondary | ICD-10-CM

## 2016-10-22 DIAGNOSIS — G9340 Encephalopathy, unspecified: Secondary | ICD-10-CM

## 2016-10-22 LAB — URINALYSIS, ROUTINE W REFLEX MICROSCOPIC
Bilirubin Urine: NEGATIVE
Glucose, UA: NEGATIVE mg/dL
Hgb urine dipstick: NEGATIVE
Ketones, ur: NEGATIVE mg/dL
Nitrite: NEGATIVE
Protein, ur: 30 mg/dL — AB
Specific Gravity, Urine: 1.03 (ref 1.005–1.030)
pH: 5 (ref 5.0–8.0)

## 2016-10-22 LAB — AMMONIA: Ammonia: 56 umol/L — ABNORMAL HIGH (ref 9–35)

## 2016-10-22 LAB — CBC
HCT: 39.1 % (ref 35.0–47.0)
Hemoglobin: 13.4 g/dL (ref 12.0–16.0)
MCH: 34.8 pg — ABNORMAL HIGH (ref 26.0–34.0)
MCHC: 34.3 g/dL (ref 32.0–36.0)
MCV: 101.7 fL — ABNORMAL HIGH (ref 80.0–100.0)
Platelets: 60 10*3/uL — ABNORMAL LOW (ref 150–440)
RBC: 3.85 MIL/uL (ref 3.80–5.20)
RDW: 13.2 % (ref 11.5–14.5)
WBC: 6.8 10*3/uL (ref 3.6–11.0)

## 2016-10-22 LAB — COMPREHENSIVE METABOLIC PANEL
ALT: 52 U/L (ref 14–54)
AST: 88 U/L — ABNORMAL HIGH (ref 15–41)
Albumin: 3.7 g/dL (ref 3.5–5.0)
Alkaline Phosphatase: 63 U/L (ref 38–126)
Anion gap: 7 (ref 5–15)
BUN: 7 mg/dL (ref 6–20)
CO2: 29 mmol/L (ref 22–32)
Calcium: 8.7 mg/dL — ABNORMAL LOW (ref 8.9–10.3)
Chloride: 99 mmol/L — ABNORMAL LOW (ref 101–111)
Creatinine, Ser: 0.55 mg/dL (ref 0.44–1.00)
GFR calc Af Amer: >60 mL/min (ref >60)
GFR calc non Af Amer: >60 mL/min (ref >60)
Glucose, Bld: 208 mg/dL — ABNORMAL HIGH (ref 65–99)
Potassium: 3.7 mmol/L (ref 3.5–5.1)
Sodium: 135 mmol/L (ref 135–145)
Total Bilirubin: 1.9 mg/dL — ABNORMAL HIGH (ref 0.3–1.2)
Total Protein: 6.7 g/dL (ref 6.5–8.1)

## 2016-10-22 LAB — MRSA PCR SCREENING: MRSA by PCR: POSITIVE — AB

## 2016-10-22 LAB — PHOSPHORUS: Phosphorus: 4.1 mg/dL (ref 2.5–4.6)

## 2016-10-22 LAB — MAGNESIUM: Magnesium: 1.6 mg/dL — ABNORMAL LOW (ref 1.7–2.4)

## 2016-10-22 LAB — GLUCOSE, CAPILLARY: Glucose-Capillary: 222 mg/dL — ABNORMAL HIGH (ref 65–99)

## 2016-10-22 MED ORDER — LACTULOSE 10 GM/15ML PO SOLN
20.0000 g | Freq: Three times a day (TID) | ORAL | Status: DC
  Administered 2016-10-22 – 2016-10-26 (×12): 20 g via ORAL
  Filled 2016-10-22 (×12): qty 30

## 2016-10-22 MED ORDER — THIAMINE HCL 100 MG/ML IJ SOLN
100.0000 mg | Freq: Every day | INTRAMUSCULAR | Status: DC
  Administered 2016-10-23 – 2016-10-26 (×4): 100 mg via INTRAVENOUS
  Filled 2016-10-22 (×4): qty 2

## 2016-10-22 MED ORDER — MUPIROCIN 2 % EX OINT
1.0000 "application " | TOPICAL_OINTMENT | Freq: Two times a day (BID) | CUTANEOUS | Status: DC
  Administered 2016-10-22 – 2016-10-26 (×9): 1 via NASAL
  Filled 2016-10-22: qty 22

## 2016-10-22 MED ORDER — INSULIN DETEMIR 100 UNIT/ML ~~LOC~~ SOLN
25.0000 [IU] | Freq: Two times a day (BID) | SUBCUTANEOUS | Status: DC
  Administered 2016-10-22 – 2016-10-26 (×9): 25 [IU] via SUBCUTANEOUS
  Filled 2016-10-22 (×10): qty 0.25

## 2016-10-22 MED ORDER — CHLORHEXIDINE GLUCONATE CLOTH 2 % EX PADS
6.0000 | MEDICATED_PAD | Freq: Every day | CUTANEOUS | Status: DC
  Administered 2016-10-22 – 2016-10-26 (×3): 6 via TOPICAL

## 2016-10-22 MED ORDER — FOLIC ACID 1 MG PO TABS
1.0000 mg | ORAL_TABLET | Freq: Every day | ORAL | Status: DC
  Administered 2016-10-22: 1 mg via ORAL
  Filled 2016-10-22: qty 1

## 2016-10-22 MED ORDER — LACTULOSE 10 GM/15ML PO SOLN: 20.0000 g | Freq: Two times a day (BID) | ORAL | Status: DC

## 2016-10-22 MED ORDER — SPIRONOLACTONE 25 MG PO TABS
100.0000 mg | ORAL_TABLET | Freq: Every day | ORAL | Status: DC
  Administered 2016-10-22 – 2016-10-26 (×5): 100 mg via ORAL
  Filled 2016-10-22 (×5): qty 4

## 2016-10-22 MED ORDER — VITAMIN B-1 100 MG PO TABS
100.0000 mg | ORAL_TABLET | Freq: Every day | ORAL | Status: DC
  Administered 2016-10-22: 100 mg via ORAL
  Filled 2016-10-22: qty 1

## 2016-10-22 MED ORDER — IPRATROPIUM-ALBUTEROL 0.5-2.5 (3) MG/3ML IN SOLN: 3.0000 mL | Freq: Four times a day (QID) | RESPIRATORY_TRACT | Status: DC | PRN

## 2016-10-22 MED ORDER — ENOXAPARIN SODIUM 40 MG/0.4ML ~~LOC~~ SOLN
40.0000 mg | Freq: Two times a day (BID) | SUBCUTANEOUS | Status: DC
  Administered 2016-10-22: 40 mg via SUBCUTANEOUS
  Filled 2016-10-22: qty 0.4

## 2016-10-22 MED ORDER — LACTULOSE 10 GM/15ML PO SOLN
30.0000 g | Freq: Three times a day (TID) | ORAL | Status: DC
  Filled 2016-10-22: qty 60

## 2016-10-22 MED ORDER — VITAMIN B-12 1000 MCG PO TABS
2000.0000 ug | ORAL_TABLET | Freq: Every day | ORAL | Status: DC
  Administered 2016-10-22 – 2016-10-26 (×5): 2000 ug via ORAL
  Filled 2016-10-22 (×5): qty 2

## 2016-10-22 MED ORDER — PANTOPRAZOLE SODIUM 40 MG PO TBEC
40.0000 mg | DELAYED_RELEASE_TABLET | Freq: Two times a day (BID) | ORAL | Status: DC
  Administered 2016-10-22 – 2016-10-26 (×9): 40 mg via ORAL
  Filled 2016-10-22 (×9): qty 1

## 2016-10-22 MED ORDER — FOLIC ACID 5 MG/ML IJ SOLN
1.0000 mg | Freq: Every day | INTRAMUSCULAR | Status: DC
  Administered 2016-10-23 – 2016-10-26 (×4): 1 mg via INTRAVENOUS
  Filled 2016-10-22 (×5): qty 0.2

## 2016-10-22 MED ORDER — MAGNESIUM SULFATE 2 GM/50ML IV SOLN
2.0000 g | Freq: Once | INTRAVENOUS | Status: AC
  Administered 2016-10-22: 2 g via INTRAVENOUS
  Filled 2016-10-22: qty 50

## 2016-10-22 MED ORDER — ALPRAZOLAM 0.5 MG PO TABS
0.5000 mg | ORAL_TABLET | Freq: Two times a day (BID) | ORAL | Status: DC
  Administered 2016-10-22 – 2016-10-26 (×8): 0.5 mg via ORAL
  Filled 2016-10-22 (×9): qty 1

## 2016-10-22 MED ORDER — RIFAXIMIN 550 MG PO TABS
550.0000 mg | ORAL_TABLET | Freq: Two times a day (BID) | ORAL | Status: DC
  Administered 2016-10-22 – 2016-10-26 (×8): 550 mg via ORAL
  Filled 2016-10-22 (×10): qty 1

## 2016-10-22 MED ORDER — ACETAMINOPHEN 325 MG PO TABS
650.0000 mg | ORAL_TABLET | Freq: Four times a day (QID) | ORAL | Status: DC | PRN
  Filled 2016-10-22: qty 2

## 2016-10-22 MED ORDER — ALPRAZOLAM 0.25 MG PO TABS: 0.2500 mg | ORAL_TABLET | Freq: Four times a day (QID) | ORAL | Status: DC | PRN

## 2016-10-22 MED ORDER — ENOXAPARIN SODIUM 40 MG/0.4ML ~~LOC~~ SOLN
40.0000 mg | SUBCUTANEOUS | Status: DC
  Administered 2016-10-23 – 2016-10-24 (×2): 40 mg via SUBCUTANEOUS
  Filled 2016-10-22 (×2): qty 0.4

## 2016-10-22 NOTE — Progress Notes (Signed)
[place] responded to request for Advance Directive Education. Patient was not available when [place] rounded on the unit. CH will leave message for next on-call [place] to follow-up if [facility] is unable to see patient. Patient was resting.   10/22/16 1410  Clinical Encounter Type  Visited With Patient not available  Visit Type Initial;Spiritual support  Referral From Nurse

## 2016-10-22 NOTE — Progress Notes (Signed)
Anticoagulation monitoring(Lovenox):   51 yo female ordered Lovenox 40 mg Q24h  Filed Weights   10/21/16 1627 10/22/16 0500  Weight: 270 lb (122.5 kg) 268 lb 1.3 oz (121.6 kg)   BMI = 49    Lab Results  Component Value Date   CREATININE 0.55 10/22/2016   CREATININE 0.64 10/21/2016   CREATININE 0.56 10/02/2016   Estimated Creatinine Clearance: 101 mL/min (by C-G formula based on SCr of 0.55 mg/dL). Hemoglobin & Hematocrit     Component Value Date/Time   HGB 13.9 10/21/2016 1635   HGB 16.5 (H) 03/27/2014 1042   HCT 40.5 10/21/2016 1635   HCT 49.3 (H) 03/27/2014 1042     Per Protocol for Patient with estCrcl > 30 ml/min and BMI > 40, will transition to Lovenox 40 mg Q12h.

## 2016-10-22 NOTE — Progress Notes (Signed)
PULMONARY / CRITICAL CARE MEDICINE   Name: Lori Mckenzie MRN: [id number] DOB: June 17, 1965    ADMISSION DATE:  10/21/2016   CONSULTATION DATE:10/21/2016    REFERRING MD:  Dr. Vianne Bulls  REASON: Acute respiratory failure and AMS  CHIEF COMPLAINT:  AMS  HISTORY OF PRESENT ILLNESS: 51 Y/O female with a  PMH as indicated below who presented to the ED via EMS after being found head down in the car. At the ED, patient was hypoxic with SPO2 in the 80s. Labs showed an elevated ammonia level and her ABG showed hypoxemia and hypercarbia. She was placed on BiPAP and admitted to the ICU She has a h/o ETOH abuse, hyperammonemia and elevated LFTs, She also has a h/o NASH. She is on lactulose. She remains somnolent and confused  SUBJECTIVE: Improved mentation this morning but still confused. Ammonia level still elevated. Multiple stools from lactulose enema. Offers no complaints  VITAL SIGNS: BP 101/64   Pulse (!) 122   Temp 100.1 F (37.8 C) (Oral)   Resp (!) 22   Ht 5' 2"  (1.575 m)   Wt 268 lb 1.3 oz (121.6 kg)   SpO2 93%   BMI 49.03 kg/m   HEMODYNAMICS:    VENTILATOR SETTINGS: FiO2 (%):  [30 %] 30 %  INTAKE / OUTPUT: No intake/output data recorded.  PHYSICAL EXAMINATION: General: well developed, well nourished, NAD Neuro:  Awake, alert to person and place, follows basic commands, speech is normal. HEENT: [place]/AT, PERRLA, oral mucosa dry, trachea midline, neck is supple, no JVD Cardiovascular: RRR, S1/S2, no MRG, +2 pulses, trace edema Lungs: Bilateral breath sounds, no wheezes or rhonchi Abdomen: Obese, normal bowel sounds, diffuse pain with palpation Musculoskeletal:  No deformities, +rom Skin:  Warm and dry  LABS:  BMET  Recent Labs Lab 10/21/16 1635 10/22/16 0424  NA 138 135  K 3.7 3.7  CL 100* 99*  CO2 28 29  BUN 9 7  CREATININE 0.64 0.55  GLUCOSE 201* 208*    Electrolytes  Recent Labs Lab 10/21/16 1635 10/22/16 0424  CALCIUM 9.8 8.7*  MG  --  1.6*  PHOS   --  4.1    CBC  Recent Labs Lab 10/21/16 1635  WBC 3.6  HGB 13.9  HCT 40.5  PLT 80*    Coag's No results for input(s): APTT, INR in the last 168 hours.  Sepsis Markers No results for input(s): LATICACIDVEN, PROCALCITON, O2SATVEN in the last 168 hours.  ABG  Recent Labs Lab 10/21/16 1720  PHART 7.29*  PCO2ART 65*  PO2ART 69*    Liver Enzymes  Recent Labs Lab 10/21/16 1635 10/22/16 0424  AST 99* 88*  ALT 58* 52  ALKPHOS 74 63  BILITOT 2.0* 1.9*  ALBUMIN 4.2 3.7    Cardiac Enzymes No results for input(s): TROPONINI, PROBNP in the last 168 hours.  Glucose  Recent Labs Lab 10/21/16 1649 10/21/16 2137  GLUCAP 198* 238*    Imaging Ct Head Wo Contrast  Result Date: 10/21/2016 CLINICAL DATA:  Altered mental status EXAM: CT HEAD WITHOUT CONTRAST TECHNIQUE: Contiguous axial images were obtained from the base of the skull through the vertex without intravenous contrast. COMPARISON:  09/23/2016 FINDINGS: Brain: No evidence of acute infarction, hemorrhage, hydrocephalus, extra-axial collection or mass lesion/mass effect. Vascular: No hyperdense vessel or unexpected calcification. Skull: Normal. Negative for fracture or focal lesion. Sinuses/Orbits: No acute finding. Other: None IMPRESSION: No CT evidence for acute intracranial abnormality. Electronically Signed   By: Donavan Foil M.D.   On: 10/21/2016  18:00   Dg Chest Port 1 View  Result Date: 10/21/2016 CLINICAL DATA:  Elevated ammonia level. Ascites. Lethargy with slurred speech. EXAM: PORTABLE CHEST 1 VIEW COMPARISON:  Chest radiograph September 28, 2016 FINDINGS: Cardiac silhouette is mildly enlarged, mediastinal silhouette is unremarkable for this low inspiratory examination with crowded engorged vasculature markings. The lungs are clear without pleural effusions or focal consolidations. Trachea projects midline and there is no pneumothorax. Included soft tissue planes and osseous structures are non-suspicious.  IMPRESSION: Mild cardiomegaly and pulmonary vascular congestion in this low inspiratory portable examination. Electronically Signed   By: Elon Alas M.D.   On: 10/21/2016 18:11    STUDIES:  None  CULTURES: None  ANTIBIOTICS: none  SIGNIFICANT EVENTS: 10/19>admitted with AMS  LINES/TUBES: PIVs  DISCUSSION: 51 y/o female presenting with acute hypoxic/hypercarbic respiratory failure, acute encephalopathy, hyperammonemia and  Acute COPD exacerbation  ASSESSMENT   Acute hepatic encephalopathy Acute hypercarbic/hypoxic respiratory failure Hyperammonemia AcuteCOPD exacerbation Alcoholic liver cirrhosis versus NASH H/O ETOH abuse H/O T2DM  PLAN Continuous BiPAP and titrate off as tolerated Start po lactulose this am Carb modified diet Continue to hold all sedating medications Continue Blood glucose monitoring with sliding scale insulin coverage Resume home dose of Rifaximin this morning Trend ammonia level Thiamine, folate and B-12 Nebs prn Trend LFTs GI and DVT : PPI and lovenox  FAMILY  - Updates: No family at bedside. Will update when available - Inter-disciplinary family meet or Palliative Care meeting due by:  day [place]. [facility] ANP-BC Pulmonary and Critical Care Medicine [facility] Pager [phone] or [phone]  10/22/2016, 6:17 AM   PCCM ATTENDING ATTESTATION: I have evaluated patient with the APP [place], reviewed database in its entirety and discussed care plan in detail. In addition, this patient was discussed on multidisciplinary rounds.   She is surprisingly cogent in her answers despite still being obviously encephalopathic. She is requesting Xanax which she takes on a scheduled basis in rather large doses chronically.   I agree with the above findings, assessment and plan. She needs to remain in SDU for now. Will resume alprazolam at a reduced dose to prevent withdrawal syndrome. Advance diet and activity   Merton Border,  MD PCCM service Mobile [phone] Pager [phone] 10/22/2016 11:52 AM

## 2016-10-22 NOTE — Progress Notes (Signed)
[facility] [place] NAME: Lori Mckenzie    MR#:  [id number]  DATE OF BIRTH:  06-21-1965  SUBJECTIVE:admitted yesterday night for hypercapnic respiratory failure, hepatic encephalopathy. She was confused when she cam Today she is alert, sitting in the chair in ICU. Off the BiPAP. Complains of mild abdominal pain Asking for pain medicines, anxiety medicine.patient has slight confusion.  CHIEF COMPLAINT:   Chief Complaint  Patient presents with  . Altered Mental Status    REVIEW OF SYSTEMS:   ROS CONSTITUTIONAL: No fever, fatigue or weakness.  EYES: No blurred or double vision.  EARS, NOSE, AND THROAT: No tinnitus or ear pain.  RESPIRATORY: No cough, shortness of breath, wheezing or hemoptysis.  CARDIOVASCULAR: No chest pain, orthopnea, edema.  GASTROINTESTINAL: No nausea, vomiting, diarrhea .complains of abdominal pain. GENITOURINARY: No dysuria, hematuria.  ENDOCRINE: No polyuria, nocturia,  HEMATOLOGY: No anemia, easy bruising or bleeding SKIN: No rash or lesion. MUSCULOSKELETAL: No joint pain or arthritis.   NEUROLOGIC: No tingling, numbness, weakness.  PSYCHIATRY: has anxiety.  DRUG ALLERGIES:   Allergies  Allergen Reactions  . Tape Swelling  . Vicodin [Hydrocodone-Acetaminophen] Itching and Rash    VITALS:  Blood pressure 118/89, pulse (!) 122, temperature 100 F (37.8 C), temperature source Oral, resp. rate (!) 29, height 5' 2"  (1.575 m), weight 121.6 kg (268 lb 1.3 oz), SpO2 92 %.  PHYSICAL EXAMINATION:  GENERAL:  51 y.o.-year-old patient lying in the bed with no acute distress.  EYES: Pupils equal, round, reactive to light  . No scleral icterus. Extraocular muscles intact.  HEENT: Head atraumatic, normocephalic. Oropharynx and nasopharynx clear.  NECK:  Supple, no jugular venous distention. No thyroid enlargement, no tenderness.  LUNGS: Normal breath sounds bilaterally, no wheezing, rales,rhonchi or crepitation. No  use of accessory muscles of respiration.  CARDIOVASCULAR: S1, S2 normal. No murmurs, rubs, or gallops.  ABDOMEN: Soft,obese, bowel sounds present, patient has tenderness in epigastric, right lower quadrant.Marland Kitchen  EXTREMITIES: No pedal edema, cyanosis, or clubbing.  NEUROLOGIC: Cranial nerves II through XII are intact. Muscle strength 5/5 in all extremities. Sensation intact. Gait not checked.  PSYCHIATRIC: The patient is alert and oriented x 3.  SKIN: No obvious rash, lesion, or ulcer.    LABORATORY PANEL:   CBC  Recent Labs Lab 10/22/16 0424  WBC 6.8  HGB 13.4  HCT 39.1  PLT 60*   ------------------------------------------------------------------------------------------------------------------  Chemistries   Recent Labs Lab 10/22/16 0424  NA 135  K 3.7  CL 99*  CO2 29  GLUCOSE 208*  BUN 7  CREATININE 0.55  CALCIUM 8.7*  MG 1.6*  AST 88*  ALT 52  ALKPHOS 63  BILITOT 1.9*   ------------------------------------------------------------------------------------------------------------------  Cardiac Enzymes No results for input(s): TROPONINI in the last 168 hours. ------------------------------------------------------------------------------------------------------------------  RADIOLOGY:  Ct Head Wo Contrast  Result Date: 10/21/2016 CLINICAL DATA:  Altered mental status EXAM: CT HEAD WITHOUT CONTRAST TECHNIQUE: Contiguous axial images were obtained from the base of the skull through the vertex without intravenous contrast. COMPARISON:  09/23/2016 FINDINGS: Brain: No evidence of acute infarction, hemorrhage, hydrocephalus, extra-axial collection or mass lesion/mass effect. Vascular: No hyperdense vessel or unexpected calcification. Skull: Normal. Negative for fracture or focal lesion. Sinuses/Orbits: No acute finding. Other: None IMPRESSION: No CT evidence for acute intracranial abnormality. Electronically Signed   By: Donavan Foil M.D.   On: 10/21/2016 18:00   Dg Chest  Port 1 View  Result Date: 10/21/2016 CLINICAL DATA:  Elevated ammonia level. Ascites.  Lethargy with slurred speech. EXAM: PORTABLE CHEST 1 VIEW COMPARISON:  Chest radiograph September 28, 2016 FINDINGS: Cardiac silhouette is mildly enlarged, mediastinal silhouette is unremarkable for this low inspiratory examination with crowded engorged vasculature markings. The lungs are clear without pleural effusions or focal consolidations. Trachea projects midline and there is no pneumothorax. Included soft tissue planes and osseous structures are non-suspicious. IMPRESSION: Mild cardiomegaly and pulmonary vascular congestion in this low inspiratory portable examination. Electronically Signed   By: Elon Alas M.D.   On: 10/21/2016 18:11    EKG:   Orders placed or performed during the hospital encounter of 10/21/16  . EKG 12-Lead  . EKG 12-Lead    ASSESSMENT AND PLAN:   Altered mental status secondary to hepatic encephalopathy, hypercapnic respiratory failure: Initially was on BiPAP, received lactulose rectally. Today she is alert, awake. Off the BiPAP.she started by mouth lactulose today, patient told me that she ran out of lactulose,ammonia level is still high at 56. Continue lactulose, patient can be moved to regular room t.    2.Nonalcoholic liver cirrhosis: Restarted Lasix, Aldactone  #3 history of anxiety,, depression: Restarted the medication for depression and anxiety. #4 diabetes mellitus type 2:Continue sliding scale with coverage,a.m. And low-dose Lantus. Chronic pain syndrome:Patient is slightly confused use narcotics sparingly. #Physical therapy evaluation, likely discharged tomorrow or Monday.    All the records are reviewed and case discussed with Care Management/Social Workerr. Management plans discussed with the patient, family and they are in agreement.  CODE STATUS: full  TOTAL TIME TAKING CARE OF THIS PATIENT: 35 minutes.   POSSIBLE D/C IN 1 DAYS, DEPENDING ON  CLINICAL CONDITION.   Epifanio Lesches M.D on 10/22/2016 at 12:29 PM  Between 7am to 6pm - Pager - [phone]  After 6pm go to [email or web address] - password [email or web address] [facility]  Office  [phone]  CC: Primary care physician; Ashkin, Neldon Labella, MD   Note: This dictation was prepared with Dragon dictation along with smaller phrase technology. Any transcriptional errors that result from this process are unintentional.

## 2016-10-22 NOTE — Consult Note (Signed)
PULMONARY / CRITICAL CARE MEDICINE   Name: Lori Mckenzie MRN: [id number] DOB: Mar 23, 1965    ADMISSION DATE:  10/21/2016   CONSULTATION DATE:10/21/2016    REFERRING MD:  Dr. Vianne Bulls  REASON: Acute respiratory failure and AMS  CHIEF COMPLAINT:  AMS  HISTORY OF PRESENT ILLNESS: 51 Y/O female with a  PMH as indicated below who presented to the ED via EMS after being found head down in the car. At the ED, patient was hypoxic with SPO2 in the 80s. Labs showed an elevated ammonia level and her ABG showed hypoxemia and hypercarbia. She was placed on BiPAP and admitted to the ICU She has a h/o ETOH abuse, hyperammonemia and elevated LFTs, She also has a h/o NASH. She is on lactulose. She remains somnolent and confused  PAST MEDICAL HISTORY :  She  has a past medical history of Abdominal abscess (08/25/2014); Acid reflux (08/10/2010); Acute cervical myofascial strain (02/09/2016); Anxiety; Ascites; Asthma; Back pain; Bile leak, postoperative (07/17/2014); Brittle bone disease; Cancer ([place]); Cervical disc disease; Chronic kidney disease; Collagen vascular disease ([place]); COPD (chronic obstructive pulmonary disease) ([place]); Diabetes mellitus without complication ([place]); GERD (gastroesophageal reflux disease); Hypertension; Hypothyroidism; Left upper quadrant pain (01/09/2014); Major depressive disorder with single episode (12/05/2011); Major depressive disorder, single episode (12/05/2011); Migraines; NASH (nonalcoholic steatohepatitis); Respiratory infection; Shock ([place]) (09/18/2014); Sleep apnea; Sleep apnea; Syncope (11/16/2014); Thyroid disease; and TIA (transient ischemic attack).  PAST SURGICAL HISTORY: She  has a past surgical history that includes Abdominal hysterectomy; Tubal ligation; Colonoscopy with propofol (N/A, 06/23/2014); Esophagogastroduodenoscopy (N/A, 06/23/2014); Cholecystectomy (N/A, 07/15/2014); ERCP (N/A, 07/16/2014); Wisdom tooth extraction; ERCP (N/A, 10/03/2014); Esophagogastroduodenoscopy (egd) with  propofol (N/A, 12/01/2015); and Esophagogastroduodenoscopy (N/A, 06/30/2016).  Allergies  Allergen Reactions  . Tape Swelling  . Vicodin [Hydrocodone-Acetaminophen] Itching and Rash    No current facility-administered medications on file prior to encounter.    Current Outpatient Prescriptions on File Prior to Encounter  Medication Sig  . albuterol (PROVENTIL HFA;VENTOLIN HFA) 108 (90 Base) MCG/ACT inhaler Inhale 2 puffs into the lungs every 6 (six) hours as needed for wheezing or shortness of breath.  . ALPRAZolam (XANAX) 1 MG tablet Take 1 mg by mouth 3 (three) times daily.   . cholecalciferol (VITAMIN D) 1000 units tablet Take 2,000 Units by mouth daily.  . citalopram (CELEXA) 20 MG tablet Take 30-40 mg by mouth daily.   . diclofenac sodium (VOLTAREN) 1 % GEL Apply 4 g topically 4 (four) times daily.  Marland Kitchen dicyclomine (BENTYL) 10 MG capsule Take 10 mg by mouth 2 (two) times daily.   . fluticasone (FLONASE) 50 MCG/ACT nasal spray Place 1-2 sprays into both nostrils daily.   Derald Macleod Factor (INTRINSI B12-FOLATE) [phone] MCG-MCG-MG TABS Take 1 tablet by mouth daily. Reported on 04/06/2015  . furosemide (LASIX) 40 MG tablet Take 40 mg by mouth daily.   Marland Kitchen gabapentin (NEURONTIN) 300 MG capsule Take 600 mg by mouth 4 (four) times daily.   . hydrochlorothiazide (MICROZIDE) 12.5 MG capsule Take 12.5 mg by mouth daily as needed. For high blood pressure  . ibuprofen (ADVIL,MOTRIN) 600 MG tablet Take 1 tablet (600 mg total) by mouth every 8 (eight) hours as needed.  . insulin aspart (NOVOLOG) 100 UNIT/ML injection Inject 30-40 Units into the skin 3 (three) times daily before meals.  . insulin detemir (LEVEMIR) 100 UNIT/ML injection Inject 50 Units into the skin 2 (two) times daily.  Marland Kitchen ipratropium-albuterol (DUONEB) 0.5-2.5 (3) MG/3ML SOLN Take 3 mLs by nebulization every 4 (four) hours as needed. For  shortness of breath/wheezing  . lactulose (CHRONULAC) 10 GM/15ML solution Take 10 g by  mouth 4 (four) times daily.   Marland Kitchen nystatin (MYCOSTATIN) powder Apply 1 g topically 3 (three) times daily as needed. For irritation  . oxyCODONE (OXY IR/ROXICODONE) 5 MG immediate release tablet Take 1 tablet (5 mg total) by mouth every 4 (four) hours as needed for severe pain.  Marland Kitchen oxycodone (OXY-IR) 5 MG capsule Take 1 capsule (5 mg total) by mouth every 6 (six) hours as needed.  Marland Kitchen oxyCODONE-acetaminophen (ROXICET) 5-325 MG tablet Take 1 tablet by mouth every 6 (six) hours as needed for severe pain.  . pantoprazole (PROTONIX) 40 MG tablet Take 40 mg by mouth 2 (two) times daily before a meal.  . potassium chloride SA (K-DUR,KLOR-CON) 20 MEQ tablet Take 10-20 mEq by mouth daily as needed. For high blood pressure  . promethazine (PHENERGAN) 12.5 MG tablet Take 2 tablets (25 mg total) by mouth every 6 (six) hours as needed for nausea or vomiting.  . rifaximin (XIFAXAN) 550 MG TABS tablet Take 550 mg by mouth 2 (two) times daily.   . rizatriptan (MAXALT) 10 MG tablet Take 10 mg by mouth as needed for migraine.   Marland Kitchen spironolactone (ALDACTONE) 100 MG tablet Take 100 mg by mouth daily.   . vitamin B-12 (CYANOCOBALAMIN) 1000 MCG tablet Take 2,000 mcg by mouth daily.  . [DISCONTINUED] SUMAtriptan (IMITREX) 50 MG tablet Take 50 mg by mouth every 2 (two) hours as needed. For migraines    FAMILY HISTORY:  Her indicated that her mother is deceased. She indicated that her father is deceased. She indicated that the status of her brother is unknown.    SOCIAL HISTORY: She  reports that she has never smoked. She has never used smokeless tobacco. She reports that she does not drink alcohol or use drugs.  REVIEW OF SYSTEMS:   Unable to obtain due to patient's decreased level of consciousness  SUBJECTIVE:   VITAL SIGNS: BP 107/67   Pulse 91   Temp 98.9 F (37.2 C) (Oral)   Resp 11   Ht 5' 2"  (1.575 m)   Wt 270 lb (122.5 kg)   SpO2 97%   BMI 49.38 kg/m   HEMODYNAMICS:    VENTILATOR SETTINGS: FiO2  (%):  [30 %] 30 %  INTAKE / OUTPUT: No intake/output data recorded.  PHYSICAL EXAMINATION: General: well developed, well nourished, NAD Neuro:  Somnolent, opens eyes to voice, not following commands, withdraws and groans to pain HEENT: [place]/AT, PERRLA, oral mucosa dry, trachea midline, neck is supple, no JVD Cardiovascular: RRR, S1/S2, no MRG, +2 pulses, trace edema Lungs: BiPAP, bilateral breath sounds, no wheezes or rhonchi Abdomen: Obese, normal bowel sounds, diffuse pain with palpation Musculoskeletal:  No deformities, +rom Skin:  Warm and dry  LABS:  BMET  Recent Labs Lab 10/21/16 1635  NA 138  K 3.7  CL 100*  CO2 28  BUN 9  CREATININE 0.64  GLUCOSE 201*    Electrolytes  Recent Labs Lab 10/21/16 1635  CALCIUM 9.8    CBC  Recent Labs Lab 10/21/16 1635  WBC 3.6  HGB 13.9  HCT 40.5  PLT 80*    Coag's No results for input(s): APTT, INR in the last 168 hours.  Sepsis Markers No results for input(s): LATICACIDVEN, PROCALCITON, O2SATVEN in the last 168 hours.  ABG  Recent Labs Lab 10/21/16 1720  PHART 7.29*  PCO2ART 65*  PO2ART 69*    Liver Enzymes  Recent Labs Lab 10/21/16  1635  AST 99*  ALT 58*  ALKPHOS 74  BILITOT 2.0*  ALBUMIN 4.2    Cardiac Enzymes No results for input(s): TROPONINI, PROBNP in the last 168 hours.  Glucose  Recent Labs Lab 10/21/16 1649  GLUCAP 198*    Imaging Ct Head Wo Contrast  Result Date: 10/21/2016 CLINICAL DATA:  Altered mental status EXAM: CT HEAD WITHOUT CONTRAST TECHNIQUE: Contiguous axial images were obtained from the base of the skull through the vertex without intravenous contrast. COMPARISON:  09/23/2016 FINDINGS: Brain: No evidence of acute infarction, hemorrhage, hydrocephalus, extra-axial collection or mass lesion/mass effect. Vascular: No hyperdense vessel or unexpected calcification. Skull: Normal. Negative for fracture or focal lesion. Sinuses/Orbits: No acute finding. Other: None  IMPRESSION: No CT evidence for acute intracranial abnormality. Electronically Signed   By: Donavan Foil M.D.   On: 10/21/2016 18:00   Dg Chest Port 1 View  Result Date: 10/21/2016 CLINICAL DATA:  Elevated ammonia level. Ascites. Lethargy with slurred speech. EXAM: PORTABLE CHEST 1 VIEW COMPARISON:  Chest radiograph September 28, 2016 FINDINGS: Cardiac silhouette is mildly enlarged, mediastinal silhouette is unremarkable for this low inspiratory examination with crowded engorged vasculature markings. The lungs are clear without pleural effusions or focal consolidations. Trachea projects midline and there is no pneumothorax. Included soft tissue planes and osseous structures are non-suspicious. IMPRESSION: Mild cardiomegaly and pulmonary vascular congestion in this low inspiratory portable examination. Electronically Signed   By: Elon Alas M.D.   On: 10/21/2016 18:11    STUDIES:  None  CULTURES: None  ANTIBIOTICS: none  SIGNIFICANT EVENTS: 10/19>admitted with AMS  LINES/TUBES: PIVs  DISCUSSION: 51 y/o female presenting with acute hypoxic/hypercarbic respiratory failure, acute encephalopathy, hyperammonemia and  Acute COPD exacerbation  ASSESSMENT   Acute Hepatic encephalopathy Acute hypercarbic/hypoxic respiratory failure Hyperammonemia AcuteCOPD exacerbation Alcoholic liver cirrhosis versus NASH H/O ETOH abuse H/O T2DM  PLAN Continuous BiPAP and titrate off as tolerated Lactulose enema x 1 Keep NPO FOR NOW and resume regular diet in am Hold all sedating medications Blood glucose monitoring with sliding scale insulin coverage Resume home dose of Rifaximin in am Trend ammonia level Thiamine, folate and B-12 Nebs prn Trend LFTs GI and DVT : PPI and lovenox  FAMILY  - Updates:   - Inter-disciplinary family meet or Palliative Care meeting due by:  day 7  [facility] ANP-BC Pulmonary and Critical Care Medicine [facility] Pager [phone] or  [phone] 10/21/2016, 7:52 PM    Merton Border, MD PCCM service Mobile [phone] Pager [phone] 10/22/2016 11:51 AM

## 2016-10-22 NOTE — Progress Notes (Signed)
Bipap on standby 

## 2016-10-23 LAB — CBC
HCT: 36.4 % (ref 35.0–47.0)
Hemoglobin: 12.5 g/dL (ref 12.0–16.0)
MCH: 34.9 pg — ABNORMAL HIGH (ref 26.0–34.0)
MCHC: 34.4 g/dL (ref 32.0–36.0)
MCV: 101.5 fL — ABNORMAL HIGH (ref 80.0–100.0)
Platelets: 64 10*3/uL — ABNORMAL LOW (ref 150–440)
RBC: 3.58 MIL/uL — ABNORMAL LOW (ref 3.80–5.20)
RDW: 13.1 % (ref 11.5–14.5)
WBC: 3.1 10*3/uL — ABNORMAL LOW (ref 3.6–11.0)

## 2016-10-23 LAB — COMPREHENSIVE METABOLIC PANEL
ALT: 39 U/L (ref 14–54)
AST: 48 U/L — ABNORMAL HIGH (ref 15–41)
Albumin: 3.6 g/dL (ref 3.5–5.0)
Alkaline Phosphatase: 57 U/L (ref 38–126)
Anion gap: 7 (ref 5–15)
BUN: 8 mg/dL (ref 6–20)
CO2: 31 mmol/L (ref 22–32)
Calcium: 8.6 mg/dL — ABNORMAL LOW (ref 8.9–10.3)
Chloride: 99 mmol/L — ABNORMAL LOW (ref 101–111)
Creatinine, Ser: 0.61 mg/dL (ref 0.44–1.00)
GFR calc Af Amer: >60 mL/min (ref >60)
GFR calc non Af Amer: >60 mL/min (ref >60)
Glucose, Bld: 165 mg/dL — ABNORMAL HIGH (ref 65–99)
Potassium: 3.6 mmol/L (ref 3.5–5.1)
Sodium: 137 mmol/L (ref 135–145)
Total Bilirubin: 2.2 mg/dL — ABNORMAL HIGH (ref 0.3–1.2)
Total Protein: 6.5 g/dL (ref 6.5–8.1)

## 2016-10-23 LAB — GLUCOSE, CAPILLARY
Glucose-Capillary: 215 mg/dL — ABNORMAL HIGH (ref 65–99)
Glucose-Capillary: 160 mg/dL — ABNORMAL HIGH (ref 65–99)

## 2016-10-23 LAB — AMMONIA: Ammonia: 52 umol/L — ABNORMAL HIGH (ref 9–35)

## 2016-10-23 MED ORDER — INSULIN ASPART 100 UNIT/ML ~~LOC~~ SOLN
0.0000 [IU] | Freq: Every day | SUBCUTANEOUS | Status: DC
  Administered 2016-10-25: 2 [IU] via SUBCUTANEOUS
  Filled 2016-10-23: qty 1

## 2016-10-23 MED ORDER — IBUPROFEN 400 MG PO TABS
200.0000 mg | ORAL_TABLET | Freq: Four times a day (QID) | ORAL | Status: DC | PRN
  Filled 2016-10-23: qty 1

## 2016-10-23 MED ORDER — FUROSEMIDE 40 MG PO TABS
40.0000 mg | ORAL_TABLET | Freq: Every day | ORAL | Status: DC
  Administered 2016-10-23 – 2016-10-25 (×3): 40 mg via ORAL
  Filled 2016-10-23 (×3): qty 1
  Filled 2016-10-23: qty 2

## 2016-10-23 MED ORDER — DEXTROSE 5 % IV SOLN
1.0000 g | Freq: Every day | INTRAVENOUS | Status: DC
  Administered 2016-10-23 – 2016-10-25 (×3): 1 g via INTRAVENOUS
  Filled 2016-10-23 (×4): qty 10

## 2016-10-23 MED ORDER — INSULIN ASPART 100 UNIT/ML ~~LOC~~ SOLN: 0.0000 [IU] | Freq: Three times a day (TID) | SUBCUTANEOUS | Status: DC

## 2016-10-23 MED ORDER — INSULIN ASPART 100 UNIT/ML ~~LOC~~ SOLN
0.0000 [IU] | Freq: Three times a day (TID) | SUBCUTANEOUS | Status: DC
  Administered 2016-10-23: 3 [IU] via SUBCUTANEOUS
  Administered 2016-10-24 (×2): 1 [IU] via SUBCUTANEOUS
  Administered 2016-10-24: 2 [IU] via SUBCUTANEOUS
  Administered 2016-10-25: 3 [IU] via SUBCUTANEOUS
  Administered 2016-10-26: 2 [IU] via SUBCUTANEOUS
  Administered 2016-10-26: 3 [IU] via SUBCUTANEOUS
  Filled 2016-10-23 (×7): qty 1

## 2016-10-23 NOTE — Evaluation (Signed)
Physical Therapy Evaluation Patient Details Name: Lori Mckenzie MRN: [id number] DOB: 25-Oct-1965 Today's Date: 10/23/2016   History of Present Illness  51 yo female with onset of LOC and hypoxia, admitted to ICU and noted AMS, hepatic encephalopathy, hypercapnic respiratory failure, NASH, UTI.  PMHx: ascites, abd abscess, COPD, shock, TIA, thyroid disease  Clinical Impression  Pt is not mentally clear today and is having O2 sats dropping with any sitting activity.  Had to return her to bed after initiating transfers and trip to [facility], but her nurse is aware of the limits.  Her plan is to be followed with acute therapy and will possibly have to change her discharge plan if she does not mentally clear enough to be safely walking with PT.  Her mobility is potentially very good as she can move, but does have O2 sats limiting her.    Follow Up Recommendations Home health PT;Supervision/Assistance - 24 hour;Supervision for mobility/OOB    Equipment Recommendations  None recommended by PT;Other (comment) (will need to assess as pt can walk more)    Recommendations for Other Services       Precautions / Restrictions Precautions Precautions: Fall (telemetry with O2 sats dropping) Restrictions Weight Bearing Restrictions: No      Mobility  Bed Mobility Overal bed mobility: Needs Assistance Bed Mobility: Supine to Sit;Sit to Supine     Supine to sit: Min assist Sit to supine: Min assist   General bed mobility comments: unsafe and impulsive, not aware of her lines  Transfers Overall transfer level: Needs assistance Equipment used: 1 person hand held assist Transfers: Sit to/from Stand Sit to Stand: Min assist         General transfer comment: assisted due to her lack of safety awareness, poor line awarenenss  Ambulation/Gait             General Gait Details: deferred due to confusion and O2 sat drops with movement  Stairs            Wheelchair Mobility    Modified  [place] (Stroke Patients Only)       Balance Overall balance assessment: Needs assistance Sitting-balance support: Feet supported Sitting balance-Leahy Scale: Fair     Standing balance support: Bilateral upper extremity supported;During functional activity Standing balance-Leahy Scale: Fair Standing balance comment: unable to accurately get a picture of balance given her confusion                             Pertinent Vitals/Pain Pain Assessment: No/denies pain    Home Living Family/patient expects to be discharged to:: Unsure Living Arrangements: Spouse/significant other                    Prior Function Level of Independence: Independent         Comments: pt cannot contribute to history      Hand Dominance        Extremity/Trunk Assessment   Upper Extremity Assessment Upper Extremity Assessment: Overall WFL for tasks assessed    Lower Extremity Assessment Lower Extremity Assessment: Overall WFL for tasks assessed    Cervical / Trunk Assessment Cervical / Trunk Assessment: Normal  Communication   Communication: No difficulties  Cognition Arousal/Alertness: Awake/alert Behavior During Therapy: Impulsive Overall Cognitive Status: No family/caregiver present to determine baseline cognitive functioning  General Comments: pt is not clear mentally, trying to remove all medical equipment      General Comments General comments (skin integrity, edema, etc.): O2 sats were labile and down as low as 83% wiht pt sitting up bedside, closer to 88% in chair.  BSC with 87-88% and then back to bed.  Pt continually is removing her O2 and trying to pull off lines    Exercises     Assessment/Plan    PT Assessment Patient needs continued PT services  PT Problem List Decreased strength;Decreased range of motion;Decreased activity tolerance;Decreased balance;Decreased mobility;Decreased coordination;Decreased  cognition;Decreased knowledge of use of DME;Decreased safety awareness;Decreased knowledge of precautions;Cardiopulmonary status limiting activity;Obesity;Decreased skin integrity       PT Treatment Interventions DME instruction;Gait training;Functional mobility training;Therapeutic activities;Therapeutic exercise;Balance training;Neuromuscular re-education;Patient/family education    PT Goals (Current goals can be found in the Care Plan section)  Acute Rehab PT Goals Patient Stated Goal: none stated x to use [facility] PT Goal Formulation: Patient unable to participate in goal setting Time For Goal Achievement: 11/06/16 Potential to Achieve Goals: Fair    Frequency Min 2X/week   Barriers to discharge Other (comment) (not clear what her home support is)      Co-evaluation               AM-PAC PT "6 Clicks" Daily Activity  Outcome Measure Difficulty turning over in bed (including adjusting bedclothes, sheets and blankets)?: A Little Difficulty moving from lying on back to sitting on the side of the bed? : A Little Difficulty sitting down on and standing up from a chair with arms (e.g., wheelchair, bedside commode, etc,.)?: A Little Help needed moving to and from a bed to chair (including a wheelchair)?: A Little Help needed walking in hospital room?: A Lot Help needed climbing 3-5 steps with a railing? : Total 6 Click Score: 15    End of Session Equipment Utilized During Treatment: Gait belt;Oxygen Activity Tolerance: Treatment limited secondary to agitation;Treatment limited secondary to medical complications (Comment) Patient left: in bed;with call bell/phone within reach;with bed alarm set Nurse Communication: Mobility status PT Visit Diagnosis: Unsteadiness on feet (R26.81);Repeated falls (R29.6);Muscle weakness (generalized) (M62.81);Difficulty in walking, not elsewhere classified (R26.2);Other (comment) (reduction in O2 sats)    Time: 4037-5436 PT Time Calculation (min)  (ACUTE ONLY): 32 min   Charges:   PT Evaluation $PT Eval High Complexity: 1 High PT Treatments $Therapeutic Activity: 8-22 mins   PT G Codes:   PT G-Codes **NOT FOR INPATIENT CLASS** Functional Assessment Tool Used: AM-PAC 6 Clicks Basic Mobility    Ramond Dial 10/23/2016, 4:49 PM   Mee Hives, PT MS Acute Rehab Dept. Number: [place] and [place]

## 2016-10-23 NOTE — Progress Notes (Signed)
PULMONARY / CRITICAL CARE MEDICINE   Name: Lori Mckenzie MRN: [id number] DOB: November 16, 1965    ADMISSION DATE:  10/21/2016   CONSULTATION DATE:10/21/2016    REFERRING MD:  Dr. Vianne Bulls  REASON: Acute respiratory failure and AMS  CHIEF COMPLAINT:  AMS  HISTORY OF PRESENT ILLNESS: 51 Y/O female with a  PMH as indicated below who presented to the ED via EMS after being found head down in the car. At the ED, patient was hypoxic with SPO2 in the 80s. Labs showed an elevated ammonia level and her ABG showed hypoxemia and hypercarbia. She was placed on BiPAP and admitted to the ICU She has a h/o ETOH abuse, hyperammonemia and elevated LFTs, She also has a h/o NASH. She is on lactulose. She remains somnolent and confused  SUBJECTIVE:No acute issues overnight. Mental status almost at baseline  VITAL SIGNS: BP 119/81   Pulse 97   Temp 99.9 F (37.7 C) (Oral)   Resp (!) 30   Ht 5' 2"  (1.575 m)   Wt 268 lb 1.3 oz (121.6 kg)   SpO2 97%   BMI 49.03 kg/m   HEMODYNAMICS:    VENTILATOR SETTINGS:    INTAKE / OUTPUT: I/O last 3 completed shifts: In: 137.5 [I.V.:137.5] Out: -   PHYSICAL EXAMINATION: General: well developed, well nourished, NAD Neuro:  Awake, AAO X3, follows basic commands, speech is normal. HEENT: [place]/AT, PERRLA, oral mucosa dry, trachea midline, neck is supple, no JVD Cardiovascular: RRR, S1/S2, no MRG, +2 pulses, trace edema Lungs: Bilateral breath sounds, no wheezes or rhonchi Abdomen: Obese, normal bowel sounds, diffuse pain with palpation Musculoskeletal:  No deformities, +rom Skin:  Warm and dry  LABS:  BMET  Recent Labs Lab 10/21/16 1635 10/22/16 0424 10/23/16 0428  NA 138 135 137  K 3.7 3.7 3.6  CL 100* 99* 99*  CO2 28 29 31   BUN 9 7 8   CREATININE 0.64 0.55 0.61  GLUCOSE 201* 208* 165*    Electrolytes  Recent Labs Lab 10/21/16 1635 10/22/16 0424 10/23/16 0428  CALCIUM 9.8 8.7* 8.6*  MG  --  1.6*  --   PHOS  --  4.1  --     CBC  Recent  Labs Lab 10/21/16 1635 10/22/16 0424 10/23/16 0428  WBC 3.6 6.8 3.1*  HGB 13.9 13.4 12.5  HCT 40.5 39.1 36.4  PLT 80* 60* 64*    Coag's No results for input(s): APTT, INR in the last 168 hours.  Sepsis Markers No results for input(s): LATICACIDVEN, PROCALCITON, O2SATVEN in the last 168 hours.  ABG  Recent Labs Lab 10/21/16 1720  PHART 7.29*  PCO2ART 65*  PO2ART 69*    Liver Enzymes  Recent Labs Lab 10/21/16 1635 10/22/16 0424 10/23/16 0428  AST 99* 88* 48*  ALT 58* 52 39  ALKPHOS 74 63 57  BILITOT 2.0* 1.9* 2.2*  ALBUMIN 4.2 3.7 3.6    Cardiac Enzymes No results for input(s): TROPONINI, PROBNP in the last 168 hours.  Glucose  Recent Labs Lab 10/21/16 1649 10/21/16 2137 10/22/16 0752  GLUCAP 198* 238* 222*    Imaging No results found.  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 Will update when available - Inter-disciplinary family meet or Palliative Care meeting due by:  day [place]. Tukov ANP-BC Pulmonary and [place] Pager [phone] or [phone]   PCCM ATTENDING  ATTESTATION: I have evaluated patient with the APP Patria Mane, reviewed database in its entirety and discussed care plan in detail. In addition, this patient was discussed on multidisciplinary rounds. I agree with the above findings, assessment and plan. She is much improved. Will transfer to med-surg floor today and PCCM service will sign off   Merton Border, MD PCCM service Mobile [phone] Pager [phone] 10/23/2016 11:19 AM

## 2016-10-23 NOTE — Progress Notes (Signed)
[place] at [place] NAME: Lori Mckenzie    MR#:  [id number]  DATE OF BIRTH:  1965/04/18  SUBJECTIVE:admitted for hypercapnic respiratory failure, hepatic encephalopathy, she was on BiPAP and admitted to intensive care unit. Now she is off the BiPAP for last 24 hours, still kind of agitated and confused at times, today she complains of right facial pain and also right lower quadrant abdominal pain.Marland Kitchen  CHIEF COMPLAINT:   Chief Complaint  Patient presents with  . Altered Mental Status    REVIEW OF SYSTEMS:   ROS CONSTITUTIONAL: No fever, fatigue or weakness.has right facial pain.  EYES: No blurred or double vision.  EARS, NOSE, AND THROAT: No tinnitus or ear pain.  RESPIRATORY: No cough, shortness of breath, wheezing or hemoptysis.  CARDIOVASCULAR: No chest pain, orthopnea, edema.  GASTROINTESTINAL: No nausea, vomiting, diarrhea .complains of abdominal pain.patient complains of pain in the right side of abdomen.  GENITOURINARY: No dysuria, hematuria.  ENDOCRINE: No polyuria, nocturia,  HEMATOLOGY: No anemia, easy bruising or bleeding SKIN: No rash or lesion. MUSCULOSKELETAL: No joint pain or arthritis.   NEUROLOGIC: No tingling, numbness, weakness.  PSYCHIATRY: has anxiety.  DRUG ALLERGIES:   Allergies  Allergen Reactions  . Tape Swelling  . Vicodin [Hydrocodone-Acetaminophen] Itching and Rash    VITALS:  Blood pressure 119/81, pulse 97, temperature 99.9 F (37.7 C), temperature source Oral, resp. rate (!) 30, height 5' 2"  (1.575 m), weight 121.6 kg (268 lb 1.3 oz), SpO2 97 %.  PHYSICAL EXAMINATION:  GENERAL:  51 y.o.-year-old patient lying in the bed with no acute distress.  EYES: Pupils equal, round, reactive to light  . No scleral icterus. Extraocular muscles intact.  HEENT: Head atraumatic, normocephalic. Oropharynx and nasopharynx clear.  NECK:  Supple, no jugular venous distention. No thyroid enlargement, no tenderness.   LUNGS: Normal breath sounds bilaterally, no wheezing, rales,rhonchi or crepitation. No use of accessory muscles of respiration.  CARDIOVASCULAR: S1, S2 normal. No murmurs, rubs, or gallops.  ABDOMEN: Soft,obese, bowel sounds present, patient has tenderness in epigastric, right lower quadrant.Marland Kitchen  EXTREMITIES: No pedal edema, cyanosis, or clubbing.  NEUROLOGIC: Cranial nerves II through XII are intact. Muscle strength 5/5 in all extremities. Sensation intact. Gait not checked.  PSYCHIATRIC: The patient is alert and oriented x 3. Slightly confused, agitated and trying to pull wires, but able to be re oriented easily, she says she does not know where her husband is. ICU nurse says that husband also is confused and was wandering . SKIN: No obvious rash, lesion, or ulcer.    LABORATORY PANEL:   CBC  Recent Labs Lab 10/23/16 0428  WBC 3.1*  HGB 12.5  HCT 36.4  PLT 64*   ------------------------------------------------------------------------------------------------------------------  Chemistries   Recent Labs Lab 10/22/16 0424 10/23/16 0428  NA 135 137  K 3.7 3.6  CL 99* 99*  CO2 29 31  GLUCOSE 208* 165*  BUN 7 8  CREATININE 0.55 0.61  CALCIUM 8.7* 8.6*  MG 1.6*  --   AST 88* 48*  ALT 52 39  ALKPHOS 63 57  BILITOT 1.9* 2.2*   ------------------------------------------------------------------------------------------------------------------  Cardiac Enzymes No results for input(s): TROPONINI in the last 168 hours. ------------------------------------------------------------------------------------------------------------------  RADIOLOGY:  Ct Head Wo Contrast  Result Date: 10/21/2016 CLINICAL DATA:  Altered mental status EXAM: CT HEAD WITHOUT CONTRAST TECHNIQUE: Contiguous axial images were obtained from the base of the skull through the vertex without intravenous contrast. COMPARISON:  09/23/2016 FINDINGS: Brain: No evidence of  acute infarction, hemorrhage,  hydrocephalus, extra-axial collection or mass lesion/mass effect. Vascular: No hyperdense vessel or unexpected calcification. Skull: Normal. Negative for fracture or focal lesion. Sinuses/Orbits: No acute finding. Other: None IMPRESSION: No CT evidence for acute intracranial abnormality. Electronically Signed   By: Donavan Foil M.D.   On: 10/21/2016 18:00   Dg Chest Port 1 View  Result Date: 10/21/2016 CLINICAL DATA:  Elevated ammonia level. Ascites. Lethargy with slurred speech. EXAM: PORTABLE CHEST 1 VIEW COMPARISON:  Chest radiograph September 28, 2016 FINDINGS: Cardiac silhouette is mildly enlarged, mediastinal silhouette is unremarkable for this low inspiratory examination with crowded engorged vasculature markings. The lungs are clear without pleural effusions or focal consolidations. Trachea projects midline and there is no pneumothorax. Included soft tissue planes and osseous structures are non-suspicious. IMPRESSION: Mild cardiomegaly and pulmonary vascular congestion in this low inspiratory portable examination. Electronically Signed   By: Elon Alas M.D.   On: 10/21/2016 18:11    EKG:   Orders placed or performed during the hospital encounter of 10/21/16  . EKG 12-Lead  . EKG 12-Lead    ASSESSMENT AND PLAN:   Altered mental status secondary to hepatic encephalopathy, hypercapnic respiratory failure: Initially was on BiPAP,  off BiPAP, continue lactulose, rifaximin.    2.Nonalcoholic liver cirrhosis: Restarted Lasix, Aldactone History of hepatic encephalopathy, patient ammonia is a52 Today. Continue Lactulose.   #3 history of anxiety,, depression: Restarted the medication for depression and anxiety.   #4 diabetes mellitus type 2:Continue sliding scale with coverage,a.m. And low-dose Lantus.   Chronic pain syndrome:Patient is slightly confused use narcotics sparingly.Right facial pain but no tenderness on the right side of the face.   Metabolic encephalopathy, UTI:  Start IV antibiotics, follow urine cultures.  PT evaluation, likely discharge either tomorrow or day after.  All the records are reviewed and case discussed with Care Management/Social Workerr. Management plans discussed with the patient, family and they are in agreement.  CODE STATUS: full  TOTAL TIME TAKING CARE OF THIS PATIENT: 35 minutes.   POSSIBLE D/C IN 1 DAYS, DEPENDING ON CLINICAL CONDITION.   Epifanio Lesches M.D on 10/23/2016 at 10:46 AM  Between 7am to 6pm - Pager - [phone]  After 6pm go to [email or web address] - password [email or web address] [facility]  Office  [phone]  CC: Primary care physician; Ashkin, Neldon Labella, MD   Note: This dictation was prepared with Dragon dictation along with smaller phrase technology. Any transcriptional errors that result from this process are unintentional.

## 2016-10-23 NOTE — Progress Notes (Signed)
Patient in no distress. cpap on standby. Family member to have RN to call RT if she wants to use cpap tonight

## 2016-10-23 NOTE — Consult Note (Signed)
Pharmacy consulted to dose ceftriaxone for UTI. Ceftriaxone 1g q 24 hr. UA collected yesterday. No urine cx ordered. Called lab- they were able to add on and send for cx. Pharmacy will sign off.  Ramond Dial, Pharm.D, BCPS Clinical Pharmacist

## 2016-10-24 LAB — GLUCOSE, CAPILLARY
Glucose-Capillary: 170 mg/dL — ABNORMAL HIGH (ref 65–99)
Glucose-Capillary: 143 mg/dL — ABNORMAL HIGH (ref 65–99)
Glucose-Capillary: 149 mg/dL — ABNORMAL HIGH (ref 65–99)
Glucose-Capillary: 186 mg/dL — ABNORMAL HIGH (ref 65–99)

## 2016-10-24 LAB — COMPREHENSIVE METABOLIC PANEL
ALT: 30 U/L (ref 14–54)
AST: 33 U/L (ref 15–41)
Albumin: 3.6 g/dL (ref 3.5–5.0)
Alkaline Phosphatase: 51 U/L (ref 38–126)
Anion gap: 10 (ref 5–15)
BUN: 7 mg/dL (ref 6–20)
CO2: 33 mmol/L — ABNORMAL HIGH (ref 22–32)
Calcium: 8.8 mg/dL — ABNORMAL LOW (ref 8.9–10.3)
Chloride: 97 mmol/L — ABNORMAL LOW (ref 101–111)
Creatinine, Ser: 0.64 mg/dL (ref 0.44–1.00)
GFR calc Af Amer: >60 mL/min (ref >60)
GFR calc non Af Amer: >60 mL/min (ref >60)
Glucose, Bld: 148 mg/dL — ABNORMAL HIGH (ref 65–99)
Potassium: 3.2 mmol/L — ABNORMAL LOW (ref 3.5–5.1)
Sodium: 140 mmol/L (ref 135–145)
Total Bilirubin: 1.3 mg/dL — ABNORMAL HIGH (ref 0.3–1.2)
Total Protein: 6.7 g/dL (ref 6.5–8.1)

## 2016-10-24 MED ORDER — ENOXAPARIN SODIUM 40 MG/0.4ML ~~LOC~~ SOLN: 40.0000 mg | Freq: Two times a day (BID) | SUBCUTANEOUS | Status: DC

## 2016-10-24 NOTE — Progress Notes (Signed)
Changed enoxaparin 73m subq q24h to q12h due to BMI> 40 per obesity protocol. BMI 49  GDonna ChristenCoffee, PharmD, MBA, B[facility]

## 2016-10-24 NOTE — Progress Notes (Signed)
[place] at [place] NAME: Lori Mckenzie    MR#:  [id number]  DATE OF BIRTH:  1965/02/20    CHIEF COMPLAINT:   Chief Complaint  Patient presents with  . Altered Mental Status  Patient was admitted to intensive care unit for hypercapnic respiratory failure and hepatic encephalopathy currently off BiPAP and transferred to the floor. Patient is still pleasantly confused according to the nurses and during my examination but answers most of the questions appropriately she had a bowel movement yesterday, husband at bedside  REVIEW OF SYSTEMS:   ROS CONSTITUTIONAL: No fever, fatigue or weakness.has right facial pain.  EYES: No blurred or double vision.  EARS, NOSE, AND THROAT: No tinnitus or ear pain.  RESPIRATORY: No cough, shortness of breath, wheezing or hemoptysis.  CARDIOVASCULAR: No chest pain, orthopnea, edema.  GASTROINTESTINAL: No nausea, vomiting, diarrhea .complains of abdominal pain.patient complains of pain in the right side of abdomen. MUSCULOSKELETAL: No joint pain or arthritis.   NEUROLOGIC: No tingling, numbness, weakness.  PSYCHIATRY: has anxiety.  DRUG ALLERGIES:   Allergies  Allergen Reactions  . Tape Swelling  . Vicodin [Hydrocodone-Acetaminophen] Itching and Rash    VITALS:  Blood pressure (!) 97/55, pulse 97, temperature 98.9 F (37.2 C), temperature source Oral, resp. rate 16, height 5' 2"  (1.575 m), weight 121.6 kg (268 lb 1.3 oz), SpO2 (!) 83 %.  PHYSICAL EXAMINATION:  GENERAL:  51 y.o.-year-old patient lying in the bed with no acute distress.  EYES: Pupils equal, round, reactive to light  . No scleral icterus. Extraocular muscles intact.  HEENT: Head atraumatic, normocephalic. Oropharynx and nasopharynx clear.  NECK:  Supple, no jugular venous distention. No thyroid enlargement, no tenderness.  LUNGS: Normal breath sounds bilaterally, no wheezing, rales,rhonchi or crepitation. No use of accessory muscles of  respiration.  CARDIOVASCULAR: S1, S2 normal. No murmurs, rubs, or gallops.  ABDOMEN: Soft,obese, bowel sounds present, patient has tenderness in epigastric, right lower quadrant.Marland Kitchen  EXTREMITIES: No pedal edema, cyanosis, or clubbing.  NEUROLOGIC: Cranial nerves II through XII are intact. Muscle strength 5/5 in all extremities. Sensation intact. Gait not checked.  PSYCHIATRIC: The patient is alert and oriented x 2. Slightly confused, . SKIN: No obvious rash, lesion, or ulcer.    LABORATORY PANEL:   CBC  Recent Labs Lab 10/23/16 0428  WBC 3.1*  HGB 12.5  HCT 36.4  PLT 64*   ------------------------------------------------------------------------------------------------------------------  Chemistries   Recent Labs Lab 10/22/16 0424 10/23/16 0428  NA 135 137  K 3.7 3.6  CL 99* 99*  CO2 29 31  GLUCOSE 208* 165*  BUN 7 8  CREATININE 0.55 0.61  CALCIUM 8.7* 8.6*  MG 1.6*  --   AST 88* 48*  ALT 52 39  ALKPHOS 63 57  BILITOT 1.9* 2.2*   ------------------------------------------------------------------------------------------------------------------  Cardiac Enzymes No results for input(s): TROPONINI in the last 168 hours. ------------------------------------------------------------------------------------------------------------------  RADIOLOGY:  No results found.  EKG:   Orders placed or performed during the hospital encounter of 10/21/16  . EKG 12-Lead  . EKG 12-Lead    ASSESSMENT AND PLAN:   Altered mental status secondary to hepatic encephalopathy, hypercapnic respiratory failure: Initially was on BiPAP,  off BiPAP, continue lactulose, rifaximin.    2.Nonalcoholic liver cirrhosis: Restarted Lasix, Aldactone History of hepatic encephalopathy, patient ammonia is 52 yesterday Continue Lactulose.   #3 history of anxiety,, depression: Restarted the medication for depression and anxiety.   #4 diabetes mellitus type 2:Continue sliding scale with  coverage,a.m.  And low-dose Lantus.   Chronic pain syndrome:Patient is slightly confused use narcotics sparingly.Right facial pain but no tenderness on the right side of the face.   Metabolic encephalopathy, UTI: Started IV antibiotics, follow urine cultures.  PT evaluation  All the records are reviewed and case discussed with Care Management/Social Workerr. Management plans discussed with the patient, family and they are in agreement.  CODE STATUS: full  TOTAL TIME TAKING CARE OF THIS PATIENT: 35 minutes.   POSSIBLE D/C IN 1-2 DAYS, DEPENDING ON CLINICAL CONDITION.   Nicholes Mango M.D on 10/24/2016 at 3:35 PM  Between 7am to 6pm - Pager - [phone]  After 6pm go to [email or web address] - password [email or web address] [place]  Office  [phone]  CC: Primary care physician; Ashkin, Neldon Labella, MD   Note: This dictation was prepared with Dragon dictation along with smaller phrase technology. Any transcriptional errors that result from this process are unintentional.

## 2016-10-24 NOTE — Plan of Care (Signed)
Problem: Physical Regulation: Goal: Will remain free from infection Outcome: Progressing Pt receiving IV Rocephin

## 2016-10-24 NOTE — Progress Notes (Signed)
Patient resting well on 3liters. cpap on standby. Patient awakes easily. She will have RN to call RT if she wants to wear cpap

## 2016-10-25 LAB — CBC
HCT: 37.3 % (ref 35.0–47.0)
Hemoglobin: 12.7 g/dL (ref 12.0–16.0)
MCH: 34.6 pg — ABNORMAL HIGH (ref 26.0–34.0)
MCHC: 34.2 g/dL (ref 32.0–36.0)
MCV: 101.4 fL — ABNORMAL HIGH (ref 80.0–100.0)
Platelets: 69 10*3/uL — ABNORMAL LOW (ref 150–440)
RBC: 3.68 MIL/uL — ABNORMAL LOW (ref 3.80–5.20)
RDW: 13.3 % (ref 11.5–14.5)
WBC: 2.7 10*3/uL — ABNORMAL LOW (ref 3.6–11.0)

## 2016-10-25 LAB — GLUCOSE, CAPILLARY
Glucose-Capillary: 150 mg/dL — ABNORMAL HIGH (ref 65–99)
Glucose-Capillary: 221 mg/dL — ABNORMAL HIGH (ref 65–99)
Glucose-Capillary: 209 mg/dL — ABNORMAL HIGH (ref 65–99)

## 2016-10-25 LAB — AMMONIA: Ammonia: 55 umol/L — ABNORMAL HIGH (ref 9–35)

## 2016-10-25 LAB — MAGNESIUM: Magnesium: 1.7 mg/dL (ref 1.7–2.4)

## 2016-10-25 MED ORDER — OXYCODONE HCL 5 MG PO TABS
10.0000 mg | ORAL_TABLET | ORAL | Status: DC | PRN
  Administered 2016-10-25 – 2016-10-26 (×3): 10 mg via ORAL
  Filled 2016-10-25 (×3): qty 2

## 2016-10-25 MED ORDER — POTASSIUM CHLORIDE CRYS ER 20 MEQ PO TBCR
40.0000 meq | EXTENDED_RELEASE_TABLET | Freq: Once | ORAL | Status: AC
  Administered 2016-10-25: 40 meq via ORAL
  Filled 2016-10-25: qty 2

## 2016-10-25 NOTE — Plan of Care (Signed)
Problem: Education: Goal: Knowledge of [facility] Education information/materials will improve Outcome: Not Progressing Patient needs assistance.   Problem: Health Behavior/Discharge Planning: Goal: Ability to manage health-related needs will improve Outcome: Not Progressing Patient needs assistance.

## 2016-10-25 NOTE — Care Management (Addendum)
Patient admitted with AMS.  PCP Franchot Gallo  Patient listed as self pay.  PT has assessed patient and recommended home health PT.  Patient ambulated 180 feet with PT.  Patient requiring acute O2.  RNCM has requested qualifying O2 sats.  Case has been reviewed with Corene Cornea at [facility] for [place] and O2

## 2016-10-25 NOTE — Progress Notes (Signed)
Physical Therapy Treatment Patient Details Name: Lori Mckenzie MRN: [id number] DOB: 03-Mar-1965 Today's Date: 10/25/2016    History of Present Illness 51 yo female with onset of LOC and hypoxia, admitted to ICU and noted AMS, hepatic encephalopathy, hypercapnic respiratory failure, NASH, UTI.  PMHx: ascites, abd abscess, COPD, shock, TIA, thyroid disease    PT Comments    Pt in bed on room air reporting fatigue but willing to walk.  Pt stating "they took the oxygen off me".  Bed mobility with rails but without direct assist.  Pt was able to ambulate around unit with walker and min guard.  To bathroom upon return to room.  Pt overall did well with walker and mobility skills despite general pain in abdomen.  Upon return to bed, O2 sats were checked and noted to be 85-87%.  They returned to low to mid 53'Z with reapplication of O2 at 3 lpm.  Pt became tearful at end of session over current health status. Pt requested to speak with nurse at end of session.  Encouraged her to use walker at all times upon discharge.  May need to check qualifying O2 sats next session.   Follow Up Recommendations  Home health PT;Supervision/Assistance - 24 hour;Supervision for mobility/OOB     Equipment Recommendations  Rolling walker with 5" wheels    Recommendations for Other Services       Precautions / Restrictions Precautions Precautions: Fall Restrictions Weight Bearing Restrictions: No    Mobility  Bed Mobility Overal bed mobility: Modified Independent Bed Mobility: Supine to Sit;Sit to Supine     Supine to sit: Modified independent (Device/Increase time) Sit to supine: Modified independent (Device/Increase time)   General bed mobility comments: with rail  Transfers Overall transfer level: Needs assistance Equipment used: Rolling walker (2 wheeled) Transfers: Sit to/from Stand Sit to Stand: Min guard            Ambulation/Gait Ambulation/Gait assistance: Min guard Ambulation  Distance (Feet): 180 Feet Assistive device: Rolling walker (2 wheeled) Gait Pattern/deviations: Step-through pattern   Gait velocity interpretation: Below normal speed for age/gender General Gait Details: slow movements with occasional self initiated rest breaks but overall does well.   Stairs            Wheelchair Mobility    Modified Rankin (Stroke Patients Only)       Balance Overall balance assessment: Modified Independent Sitting-balance support: Feet supported Sitting balance-Leahy Scale: Good     Standing balance support: Bilateral upper extremity supported Standing balance-Leahy Scale: Fair Standing balance comment: Generally does well without LOB but slow guarded movements at times due to pain.                            Cognition Arousal/Alertness: Awake/alert Behavior During Therapy: WFL for tasks assessed/performed Overall Cognitive Status: Within Functional Limits for tasks assessed                                 General Comments: Pt appropriate conversation and seemed mentally clear this session although she was tearful at the end of session and voicing frustrations over her health.      Exercises      General Comments        Pertinent Vitals/Pain Pain Assessment: 0-10 Pain Score: 7  Pain Location: abdomen around to back Pain Descriptors / Indicators: Sore;Grimacing Pain Intervention(s): Limited activity within patient's tolerance  Home Living                      Prior Function            PT Goals (current goals can now be found in the care plan section) Progress towards PT goals: Progressing toward goals    Frequency    Min 2X/week      PT Plan Current plan remains appropriate    Co-evaluation              AM-PAC PT "6 Clicks" Daily Activity  Outcome Measure  Difficulty turning over in bed (including adjusting bedclothes, sheets and blankets)?: None Difficulty moving from lying  on back to sitting on the side of the bed? : A Little Difficulty sitting down on and standing up from a chair with arms (e.g., wheelchair, bedside commode, etc,.)?: A Little Help needed moving to and from a bed to chair (including a wheelchair)?: A Little Help needed walking in hospital room?: A Little Help needed climbing 3-5 steps with a railing? : A Lot 6 Click Score: 18    End of Session Equipment Utilized During Treatment: Gait belt Activity Tolerance: Patient tolerated treatment well;Patient limited by fatigue;Patient limited by pain Patient left: in bed;with call bell/phone within reach;with family/visitor present Nurse Communication: Other (comment)       Time: 3557-3220 PT Time Calculation (min) (ACUTE ONLY): 26 min  Charges:  $Gait Training: 8-22 mins $Therapeutic Activity: 8-22 mins                    G Codes:       Chesley Noon, PTA 10/25/16, 11:17 AM

## 2016-10-25 NOTE — Progress Notes (Signed)
[facility] NAME: Lori Mckenzie    MR#:  [id number]  DATE OF BIRTH:  19-Nov-1965    CHIEF COMPLAINT:   Chief Complaint  Patient presents with  . Altered Mental Status  Patient was admitted to intensive care unit for hypercapnic respiratory failure and hepatic encephalopathy currently off BiPAP and transferred to the floor.  mentating fine and back to her baseline according to the husband at bedside Feeling weak and tired  REVIEW OF SYSTEMS:   ROS CONSTITUTIONAL: No fever, fatigue or weakness.has right facial pain.  EYES: No blurred or double vision.  EARS, NOSE, AND THROAT: No tinnitus or ear pain.  RESPIRATORY: No cough, shortness of breath, wheezing or hemoptysis.  CARDIOVASCULAR: No chest pain, orthopnea, edema.  GASTROINTESTINAL: No nausea, vomiting, diarrhea .complains of abdominal pain.patient complains of pain in the right side of abdomen. MUSCULOSKELETAL: No joint pain or arthritis.   NEUROLOGIC: No tingling, numbness, weakness.  PSYCHIATRY: has anxiety.  DRUG ALLERGIES:   Allergies  Allergen Reactions  . Tape Swelling  . Vicodin [Hydrocodone-Acetaminophen] Itching and Rash    VITALS:  Blood pressure 97/62, pulse 87, temperature 98.6 F (37 C), temperature source Oral, resp. rate 18, height 5' 2"  (1.575 m), weight 121.6 kg (268 lb 1.3 oz), SpO2 96 %.  PHYSICAL EXAMINATION:  GENERAL:  51 y.o.-year-old patient lying in the bed with no acute distress.  EYES: Pupils equal, round, reactive to light  . No scleral icterus. Extraocular muscles intact.  HEENT: Head atraumatic, normocephalic. Oropharynx and nasopharynx clear.  NECK:  Supple, no jugular venous distention. No thyroid enlargement, no tenderness.  LUNGS: Normal breath sounds bilaterally, no wheezing, rales,rhonchi or crepitation. No use of accessory muscles of respiration.  CARDIOVASCULAR: S1, S2 normal. No murmurs, rubs, or gallops.  ABDOMEN: Soft,obese,  bowel sounds present, patient has tenderness in epigastric, right lower quadrant.Marland Kitchen  EXTREMITIES: No pedal edema, cyanosis, or clubbing.  NEUROLOGIC: Cranial nerves II through XII are intact. Muscle strength 5/5 in all extremities. Sensation intact. Gait not checked.  PSYCHIATRIC: The patient is alert and oriented x 3. . SKIN: No obvious rash, lesion, or ulcer.    LABORATORY PANEL:   CBC  Recent Labs Lab 10/25/16 0443  WBC 2.7*  HGB 12.7  HCT 37.3  PLT 69*   ------------------------------------------------------------------------------------------------------------------  Chemistries   Recent Labs Lab 10/24/16 1617 10/25/16 0443  NA 140  --   K 3.2*  --   CL 97*  --   CO2 33*  --   GLUCOSE 148*  --   BUN 7  --   CREATININE 0.64  --   CALCIUM 8.8*  --   MG  --  1.7  AST 33  --   ALT 30  --   ALKPHOS 51  --   BILITOT 1.3*  --    ------------------------------------------------------------------------------------------------------------------  Cardiac Enzymes No results for input(s): TROPONINI in the last 168 hours. ------------------------------------------------------------------------------------------------------------------  RADIOLOGY:  No results found.  EKG:   Orders placed or performed during the hospital encounter of 10/21/16  . EKG 12-Lead  . EKG 12-Lead    ASSESSMENT AND PLAN:   Altered mental status secondary to hepatic encephalopathy, hypercapnic respiratory failure: Initially was on BiPAP,  off BiPAP, continue lactulose, rifaximin.    2.Nonalcoholic liver cirrhosis:thrombocytopenia  Restarted Lasix, Aldactone History of hepatic encephalopathy, patient ammonia is 55 today Continue Lactulose. No active bleeding   #3 history of anxiety,, depression: Restarted the medication for depression and  anxiety.   #4 diabetes mellitus type 2:Continue sliding scale with coverage,a.m. And low-dose Lantus.   Chronic pain syndrome:Patient is slightly  confused use narcotics sparingly.Right facial pain but no tenderness on the right side of the face.   Metabolic encephalopathy, UTI: Started IV antibiotics, follow urine cultures greater than 100,000 colonies of enterococcus  final sensitivities pending  PT evaluation-recommending home health PT with 24-hour supervision and rolling walker with 5 inch wheels  All the records are reviewed and case discussed with Care Management/Social Workerr. Management plans discussed with the patient, family and they are in agreement.  CODE STATUS: full  TOTAL TIME TAKING CARE OF THIS PATIENT: 35 minutes.   POSSIBLE D/C IN 1-2 DAYS, DEPENDING ON CLINICAL CONDITION.   Nicholes Mango M.D on 10/25/2016 at 2:48 PM  Between 7am to 6pm - Pager - [phone]  After 6pm go to [email or web address] - password [email or web address] [facility]  Office  [phone]  CC: Primary care physician; Ashkin, Neldon Labella, MD   Note: This dictation was prepared with Dragon dictation along with smaller phrase technology. Any transcriptional errors that result from this process are unintentional.

## 2016-10-25 NOTE — Progress Notes (Signed)
SATURATION QUALIFICATIONS: (This note is used to comply with regulatory documentation for home oxygen)  Patient Saturations on Room Air at Rest = 96%  Patient Saturations on Room Air while Ambulating = 89%  Patient Saturations on 2 Liters of oxygen while Ambulating = 94%  Please briefly explain why patient needs home oxygen:

## 2016-10-26 ENCOUNTER — Ambulatory Visit: Payer: Self-pay | Attending: Pain Medicine | Admitting: Pain Medicine

## 2016-10-26 DIAGNOSIS — M79671 Pain in right foot: Secondary | ICD-10-CM | POA: Insufficient documentation

## 2016-10-26 DIAGNOSIS — M79672 Pain in left foot: Secondary | ICD-10-CM

## 2016-10-26 LAB — URINE CULTURE: Culture: >=100000 — AB

## 2016-10-26 LAB — GLUCOSE, CAPILLARY
Glucose-Capillary: 183 mg/dL — ABNORMAL HIGH (ref 65–99)
Glucose-Capillary: 202 mg/dL — ABNORMAL HIGH (ref 65–99)

## 2016-10-26 MED ORDER — NYSTATIN 100000 UNIT/GM EX POWD: 1.0000 g | Freq: Three times a day (TID) | CUTANEOUS | 0 refills | Status: AC | PRN

## 2016-10-26 MED ORDER — MUPIROCIN 2 % EX OINT: 1.0000 "application " | TOPICAL_OINTMENT | Freq: Two times a day (BID) | CUTANEOUS | 0 refills | Status: DC

## 2016-10-26 MED ORDER — FUROSEMIDE 40 MG PO TABS: 40.0000 mg | ORAL_TABLET | Freq: Two times a day (BID) | ORAL | 0 refills | Status: DC

## 2016-10-26 MED ORDER — LACTULOSE 10 GM/15ML PO SOLN: 20.0000 g | Freq: Three times a day (TID) | ORAL | 0 refills | Status: AC

## 2016-10-26 MED ORDER — OXYCODONE HCL 5 MG PO CAPS: 5.0000 mg | ORAL_CAPSULE | Freq: Four times a day (QID) | ORAL | 0 refills | Status: DC | PRN

## 2016-10-26 MED ORDER — CIPROFLOXACIN HCL 500 MG PO TABS
500.0000 mg | ORAL_TABLET | Freq: Two times a day (BID) | ORAL | Status: DC
  Administered 2016-10-26: 500 mg via ORAL
  Filled 2016-10-26: qty 1

## 2016-10-26 MED ORDER — CIPROFLOXACIN HCL 500 MG PO TABS: 500.0000 mg | ORAL_TABLET | Freq: Two times a day (BID) | ORAL | 0 refills | Status: DC

## 2016-10-26 MED ORDER — INSULIN DETEMIR 100 UNIT/ML ~~LOC~~ SOLN: 25.0000 [IU] | Freq: Two times a day (BID) | SUBCUTANEOUS | 1 refills | Status: DC

## 2016-10-26 NOTE — Discharge Instructions (Signed)
Follow-up with primary care physician and pain management in 1-2 days

## 2016-10-26 NOTE — Progress Notes (Deleted)
Patient's Name: Lori Mckenzie  MRN: [id number]  Referring Provider: Ardine Eng, MD  DOB: May 21, 1965  PCP: Ardine Eng, MD  DOS: 10/26/2016  Note by: Gaspar Cola, MD  Service setting: Ambulatory outpatient  Specialty: Interventional Pain Management  Location: [facility] (AMB) Pain Management Facility    Patient type: Established   Primary Reason(s) for Visit: Encounter for post-procedure evaluation of chronic illness with mild to moderate exacerbation CC: No chief complaint on file.  HPI  Lori Mckenzie is a 51 y.o. year old, female patient, who comes today for a post-procedure evaluation. She has Type 2 diabetes mellitus ([place]); COPD with acute exacerbation (HCC); GERD (gastroesophageal reflux disease); OSA on CPAP; Anxiety; Steatohepatitis; Dysthymia; Other social stressor; Ascites; NASH (nonalcoholic steatohepatitis); Hypokalemia; Opiate use (75 MME/Day); Long term prescription opiate use; Long term current use of opiate analgesic; Encounter for therapeutic drug level monitoring; Chronic epigastric abdominal pain; Chronic low back pain (Primary Source of Pain) (Bilateral) (R>L); Chronic neck pain (Tertiary source of pain) (Bilateral) (R>L); Breath shortness; Diastolic dysfunction; Clinical depression; Airway hyperreactivity; Essential (primary) hypertension; 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, L1-2, & L4-5); Lumbar and sacral osteoarthritis; Myofascial pain; Drowsiness; Episode of syncope; Subacute lumbar radiculopathy (left side) (S1 dermatome); Vitamin D deficiency; B12 deficiency; Folate deficiency; Thoracic radiculitis; Elevated sedimentation rate; Elevated C-reactive protein (CRP); Lumbar facet syndrome (Bilateral) (R>L); Cervical spondylosis; Lumbar spondylosis; Encounter for chronic pain management; Chronic shoulder pain (Bilateral) (R>L); Chronic carpal tunnel syndrome (Bilateral); Chronic hip pain (Bilateral) (L>R); Chronic upper back pain (Bilateral) (L>R); Osteoporosis, idiopathic; Abnormal  MRI, lumbar spine (02/03/2015); Hepatic encephalopathy ([place]); Radicular pain of thoracic region; Altered mental status; Cirrhosis of liver without ascites ([place]); Elevated liver enzymes; Opiate withdrawal ([place]); Chronic pain syndrome; Asthma; Lumbar spinal stenosis; Lumbosacral spondylosis without myelopathy; Neuromyositis; Somnolence; Lumbar central spinal stenosis (L1-2 and L4-5); Spondylosis of lumbar region without myelopathy or radiculopathy; Allodynia; Osteoarthritis; Chronic upper extremity pain (Left); Chronic radicular cervical pain (L); Chronic sacroiliac joint pain (Bilateral) (L>R); Diabetes mellitus, insulin dependent (IDDM), uncontrolled ([place]); Chronic lower extremity pain (Bilateral); Spinal stenosis, thoracic region (T10-11); Nausea without vomiting; Cirrhosis ([place]); Constipation; Hyperglycemia; Uncontrolled diabetes mellitus ([place]); Abdominal pain; Low back pain due to L1-2 disc extrusion (caudal) (Left); Acute postoperative pain; Acute hepatic encephalopathy; and Chronic feet pain (Secondary source of pain) (Bilateral) (R>L) on her problem list. Her primarily concern today is the No chief complaint on file.  Pain Assessment: Location:     Radiating:   Onset:   Duration:   Quality:   Severity:  /10 (self-reported pain score)  Note: Reported level is compatible with observation.                   When using our objective Pain Scale, levels between 6 and 10/10 are said to belong in an emergency room, as it progressively worsens from a 6/10, described as severely limiting, requiring emergency care not usually available at an outpatient pain management facility. At a 6/10 level, communication becomes difficult and requires great effort. Assistance to reach the emergency department may be required. Facial flushing and profuse sweating along with potentially dangerous increases in heart rate and blood pressure will be evident. Effect on ADL:   Timing:   Modifying factors:    Lori Mckenzie comes in  today for post-procedure evaluation after the treatment done on 08/09/2016.  Further details on both, my assessment(s), as well as the proposed treatment plan, please see below.  Post-Procedure Assessment  08/09/2016 Procedure: Therapeutic left lumbar facet RFA under  fluoroscopic guidance and IV sedation Pre-procedure pain score:  4/10 Post-procedure pain score: 1/10 Incomplete relief Influential Factors: BMI:   Intra-procedural challenges: None observed.         Assessment challenges: None detected.              Reported side-effects: None.        Post-procedural adverse reactions or complications: None reported         Sedation: Sedation provided. When no sedatives are used, the analgesic levels obtained are directly associated to the effectiveness of the local anesthetics. However, when sedation is provided, the level of analgesia obtained during the initial 1 hour following the intervention, is believed to be the result of a combination of factors. These factors may include, but are not limited to: 1. The effectiveness of the local anesthetics used. 2. The effects of the analgesic(s) and/or anxiolytic(s) used. 3. The degree of discomfort experienced by the patient at the time of the procedure. 4. The patients ability and reliability in recalling and recording the events. 5. The presence and influence of possible secondary gains and/or psychosocial factors. Reported result: Relief experienced during the 1st hour after the procedure:   (Ultra-Short Term Relief)            Interpretative annotation: Clinically appropriate result. Analgesia during this period is likely to be Local Anesthetic and/or IV Sedative (Analgesic/Anxiolytic) related.          Effects of local anesthetic: The analgesic effects attained during this period are directly associated to the localized infiltration of local anesthetics and therefore cary significant diagnostic value as to the etiological location, or anatomical  origin, of the pain. Expected duration of relief is directly dependent on the pharmacodynamics of the local anesthetic used. Long-acting (4-6 hours) anesthetics used.  Reported result: Relief during the next 4 to 6 hour after the procedure:   (Short-Term Relief)            Interpretative annotation: Clinically appropriate result. Analgesia during this period is likely to be Local Anesthetic-related.          Long-term benefit: Defined as the period of time past the expected duration of local anesthetics (1 hour for short-acting and 4-6 hours for long-acting). With the possible exception of prolonged sympathetic blockade from the local anesthetics, benefits during this period are typically attributed to, or associated with, other factors such as analgesic sensory neuropraxia, antiinflammatory effects, or beneficial biochemical changes provided by agents other than the local anesthetics.  Reported result: Extended relief following procedure:   (Long-Term Relief)            Interpretative annotation: Clinically appropriate result. Good relief. No permanent benefit expected. Inflammation plays a part in the etiology to the pain.          Current benefits: Defined as reported results that persistent at this point in time.   Analgesia: *** %            Function: Somewhat improved ROM: Somewhat improved Interpretative annotation: Recurrence of symptoms. No permanent benefit expected. Effective diagnostic intervention.          Interpretation: Results would suggest a successful diagnostic intervention.                  Plan:  Please see "Plan of Care" for details.        Laboratory Chemistry  Inflammation Markers (CRP: Acute Phase) (ESR: Chronic Phase) Lab Results  Component Value Date   CRP <0.8 04/12/2016  ESRSEDRATE 4 04/12/2016                 Renal Function Markers Lab Results  Component Value Date   BUN 7 10/24/2016   CREATININE 0.64 10/24/2016   GFRAA >60 10/24/2016   GFRNONAA >60  10/24/2016                 Hepatic Function Markers Lab Results  Component Value Date   AST 33 10/24/2016   ALT 30 10/24/2016   ALBUMIN 3.6 10/24/2016   ALKPHOS 51 10/24/2016                 Electrolytes Lab Results  Component Value Date   NA 140 10/24/2016   K 3.2 (L) 10/24/2016   CL 97 (L) 10/24/2016   CALCIUM 8.8 (L) 10/24/2016   MG 1.7 10/25/2016                 Neuropathy Markers Lab Results  Component Value Date   VITAMINB12 272 04/12/2016                 Bone Pathology Markers Lab Results  Component Value Date   ALKPHOS 51 10/24/2016   VD25OH 14.3 (L) 01/08/2015   VD125OH2TOT 28.8 01/08/2015   25OHVITD1 36 04/12/2016   25OHVITD2 3.9 04/12/2016   25OHVITD3 32 04/12/2016   CALCIUM 8.8 (L) 10/24/2016                 Coagulation Parameters Lab Results  Component Value Date   INR 1.18 12/01/2015   LABPROT 15.1 12/01/2015   APTT 24.6 08/26/2013   PLT 69 (L) 10/25/2016                 Cardiovascular Markers Lab Results  Component Value Date   BNP 11 12/11/2012   HGB 12.7 10/25/2016   HCT 37.3 10/25/2016                 Note: Lab results reviewed.  Recent Diagnostic Imaging Results  DG Chest Port 1 View CLINICAL DATA:  Elevated ammonia level. Ascites. Lethargy with slurred speech.  EXAM: PORTABLE CHEST 1 VIEW  COMPARISON:  Chest radiograph September 28, 2016  FINDINGS: Cardiac silhouette is mildly enlarged, mediastinal silhouette is unremarkable for this low inspiratory examination with crowded engorged vasculature markings. The lungs are clear without pleural effusions or focal consolidations. Trachea projects midline and there is no pneumothorax. Included soft tissue planes and osseous structures are non-suspicious.  IMPRESSION: Mild cardiomegaly and pulmonary vascular congestion in this low inspiratory portable examination.  Electronically Signed   By: Elon Alas M.D.   On: 10/21/2016 18:11 CT Head Wo Contrast CLINICAL  DATA:  Altered mental status  EXAM: CT HEAD WITHOUT CONTRAST  TECHNIQUE: Contiguous axial images were obtained from the base of the skull through the vertex without intravenous contrast.  COMPARISON:  09/23/2016  FINDINGS: Brain: No evidence of acute infarction, hemorrhage, hydrocephalus, extra-axial collection or mass lesion/mass effect.  Vascular: No hyperdense vessel or unexpected calcification.  Skull: Normal. Negative for fracture or focal lesion.  Sinuses/Orbits: No acute finding.  Other: None  IMPRESSION: No CT evidence for acute intracranial abnormality.  Electronically Signed   By: Donavan Foil M.D.   On: 10/21/2016 18:00  Complexity Note: Imaging results reviewed. Results shared with Lori Mckenzie, using Layman's terms.                         Meds  No current  facility-administered medications for this visit.  No current outpatient prescriptions on file.  Facility-Administered Medications Ordered in Other Visits:  .  acetaminophen (TYLENOL) tablet 650 mg, 650 mg, Oral, Q6H PRN, Wilhelmina Mcardle, MD .  ALPRAZolam Duanne Moron) tablet 0.5 mg, 0.5 mg, Oral, BID, Wilhelmina Mcardle, MD, 0.5 mg at 10/25/16 2242 .  bisacodyl (DULCOLAX) EC tablet 5 mg, 5 mg, Oral, Daily PRN, Epifanio Lesches, MD .  chlorhexidine (PERIDEX) 0.12 % solution 15 mL, 15 mL, Mouth Rinse, BID, Epifanio Lesches, MD, 15 mL at 10/25/16 2242 .  Chlorhexidine Gluconate Cloth 2 % PADS 6 each, 6 each, Topical, Q0600, Mikael Spray, NP, 6 each at 10/26/16 0419 .  ciprofloxacin (CIPRO) tablet 500 mg, 500 mg, Oral, BID, Gouru, Aruna, MD .  folic acid injection 1 mg, 1 mg, Intravenous, Daily, Merton Border B, MD, 1 mg at 10/25/16 0959 .  furosemide (LASIX) tablet 40 mg, 40 mg, Oral, Daily, Epifanio Lesches, MD, 40 mg at 10/25/16 0958 .  ibuprofen (ADVIL,MOTRIN) tablet 200 mg, 200 mg, Oral, Q6H PRN, Epifanio Lesches, MD .  insulin aspart (novoLOG) injection 0-5 Units, 0-5 Units,  Subcutaneous, QHS, Epifanio Lesches, MD, 2 Units at 10/25/16 2243 .  insulin aspart (novoLOG) injection 0-9 Units, 0-9 Units, Subcutaneous, TID WC, Epifanio Lesches, MD, 2 Units at 10/26/16 [phone] .  insulin detemir (LEVEMIR) injection 25 Units, 25 Units, Subcutaneous, BID, Tukov, Magadalene S, NP, 25 Units at 10/25/16 2243 .  ipratropium-albuterol (DUONEB) 0.5-2.5 (3) MG/3ML nebulizer solution 3 mL, 3 mL, Nebulization, Q6H PRN, Tukov, Magadalene S, NP .  lactulose (CHRONULAC) 10 GM/15ML solution 20 g, 20 g, Oral, TID, Epifanio Lesches, MD, 20 g at 10/25/16 2241 .  MEDLINE mouth rinse, 15 mL, Mouth Rinse, q12n4p, Epifanio Lesches, MD, 15 mL at 10/25/16 1441 .  mupirocin ointment (BACTROBAN) 2 % 1 application, 1 application, Nasal, BID, Tukov, Magadalene S, NP, 1 application at 34/19/62 2244 .  ondansetron (ZOFRAN) tablet 4 mg, 4 mg, Oral, Q6H PRN **OR** ondansetron (ZOFRAN) injection 4 mg, 4 mg, Intravenous, Q6H PRN, Epifanio Lesches, MD, 4 mg at 10/23/16 1802 .  oxyCODONE (Oxy IR/ROXICODONE) immediate release tablet 10 mg, 10 mg, Oral, Q4H PRN, Gouru, Aruna, MD, 10 mg at 10/25/16 1655 .  pantoprazole (PROTONIX) EC tablet 40 mg, 40 mg, Oral, BID AC, Tukov, Magadalene S, NP, 40 mg at 10/26/16 0827 .  rifaximin (XIFAXAN) tablet 550 mg, 550 mg, Oral, BID, Tukov, Magadalene S, NP, 550 mg at 10/25/16 0958 .  spironolactone (ALDACTONE) tablet 100 mg, 100 mg, Oral, Daily, Tukov, Magadalene S, NP, 100 mg at 10/25/16 0958 .  thiamine (B-1) injection 100 mg, 100 mg, Intravenous, Daily, Wilhelmina Mcardle, MD, 100 mg at 10/25/16 1003 .  vitamin B-12 (CYANOCOBALAMIN) tablet 2,000 mcg, 2,000 mcg, Oral, Daily, Tukov, Magadalene S, NP, 2,000 mcg at 10/25/16 0958  ROS  Constitutional: Denies any fever or chills Gastrointestinal: No reported hemesis, hematochezia, vomiting, or acute GI distress Musculoskeletal: Denies any acute onset joint swelling, redness, loss of ROM, or weakness Neurological:  No reported episodes of acute onset apraxia, aphasia, dysarthria, agnosia, amnesia, paralysis, loss of coordination, or loss of consciousness  Allergies  Lori Mckenzie is allergic to tape and vicodin [hydrocodone-acetaminophen].  PFSH  Drug: Lori Mckenzie  reports that she does not use drugs. Alcohol:  reports that she does not drink alcohol. Tobacco:  reports that she has never smoked. She has never used smokeless tobacco. Medical:  has a past medical history of Abdominal abscess (08/25/2014); Acid  reflux (08/10/2010); Acute cervical myofascial strain (02/09/2016); Anxiety; Ascites; Asthma; Back pain; Bile leak, postoperative (07/17/2014); Brittle bone disease; Cancer ([place]); Cervical disc disease; Chronic kidney disease; Collagen vascular disease ([place]); COPD (chronic obstructive pulmonary disease) ([place]); Diabetes mellitus without complication ([place]); GERD (gastroesophageal reflux disease); Hypertension; Hypothyroidism; Left upper quadrant pain (01/09/2014); Major depressive disorder with single episode (12/05/2011); Major depressive disorder, single episode (12/05/2011); Migraines; NASH (nonalcoholic steatohepatitis); Respiratory infection; Shock ([place]) (09/18/2014); Sleep apnea; Sleep apnea; Syncope (11/16/2014); Thyroid disease; and TIA (transient ischemic attack). Surgical: Lori Mckenzie  has a past surgical history that includes Abdominal hysterectomy; Tubal ligation; Colonoscopy with propofol (N/A, 06/23/2014); Esophagogastroduodenoscopy (N/A, 06/23/2014); Cholecystectomy (N/A, 07/15/2014); ERCP (N/A, 07/16/2014); Wisdom tooth extraction; ERCP (N/A, 10/03/2014); Esophagogastroduodenoscopy (egd) with propofol (N/A, 12/01/2015); and Esophagogastroduodenoscopy (N/A, 06/30/2016). Family: family history includes Emphysema in her father; Heart disease in her brother and sister; Lung cancer in her mother; Ulcers in her father and sister.  Constitutional Exam  General appearance: Well nourished, well developed, and well hydrated. In  no apparent acute distress There were no vitals filed for this visit. BMI Assessment: Estimated body mass index is 49.03 kg/m as calculated from the following:   Height as of 10/21/16: 5' 2"  (1.575 m).   Weight as of 10/22/16: 268 lb 1.3 oz (121.6 kg).  BMI interpretation table: BMI level Category Range association with higher incidence of chronic pain  <18 kg/m2 Underweight   18.5-24.9 kg/m2 Ideal body weight   25-29.9 kg/m2 Overweight Increased incidence by 20%  30-34.9 kg/m2 Obese (Class I) Increased incidence by 68%  35-39.9 kg/m2 Severe obesity (Class II) Increased incidence by 136%  >40 kg/m2 Extreme obesity (Class III) Increased incidence by 254%   BMI Readings from Last 4 Encounters:  10/22/16 49.03 kg/m  10/02/16 49.02 kg/m  09/28/16 49.02 kg/m  09/23/16 49.11 kg/m   Wt Readings from Last 4 Encounters:  10/22/16 268 lb 1.3 oz (121.6 kg)  10/02/16 268 lb (121.6 kg)  09/28/16 268 lb (121.6 kg)  09/23/16 268 lb 8 oz (121.8 kg)  Psych/Mental status: Alert, oriented x 3 (person, place, & time)       Eyes: PERLA Respiratory: No evidence of acute respiratory distress  Cervical Spine Area Exam  Skin & Axial Inspection: No masses, redness, edema, swelling, or associated skin lesions Alignment: Symmetrical Functional ROM: Unrestricted ROM      Stability: No instability detected Muscle Tone/Strength: Functionally intact. No obvious neuro-muscular anomalies detected. Sensory (Neurological): Unimpaired Palpation: No palpable anomalies              Upper Extremity (UE) Exam    Side: Right upper extremity  Side: Left upper extremity  Skin & Extremity Inspection: Skin color, temperature, and hair growth are WNL. No peripheral edema or cyanosis. No masses, redness, swelling, asymmetry, or associated skin lesions. No contractures.  Skin & Extremity Inspection: Skin color, temperature, and hair growth are WNL. No peripheral edema or cyanosis. No masses, redness, swelling,  asymmetry, or associated skin lesions. No contractures.  Functional ROM: Unrestricted ROM          Functional ROM: Unrestricted ROM          Muscle Tone/Strength: Functionally intact. No obvious neuro-muscular anomalies detected.  Muscle Tone/Strength: Functionally intact. No obvious neuro-muscular anomalies detected.  Sensory (Neurological): Unimpaired          Sensory (Neurological): Unimpaired          Palpation: No palpable anomalies  Palpation: No palpable anomalies              Specialized Test(s): Deferred         Specialized Test(s): Deferred          Thoracic Spine Area Exam  Skin & Axial Inspection: No masses, redness, or swelling Alignment: Symmetrical Functional ROM: Unrestricted ROM Stability: No instability detected Muscle Tone/Strength: Functionally intact. No obvious neuro-muscular anomalies detected. Sensory (Neurological): Unimpaired Muscle strength & Tone: No palpable anomalies  Lumbar Spine Area Exam  Skin & Axial Inspection: No masses, redness, or swelling Alignment: Symmetrical Functional ROM: Unrestricted ROM      Stability: No instability detected Muscle Tone/Strength: Functionally intact. No obvious neuro-muscular anomalies detected. Sensory (Neurological): Unimpaired Palpation: No palpable anomalies       Provocative Tests: Lumbar Hyperextension and rotation test: evaluation deferred today       Lumbar Lateral bending test: evaluation deferred today       Patrick's Maneuver: evaluation deferred today                    Gait & Posture Assessment  Ambulation: Unassisted Gait: Relatively normal for age and body habitus Posture: WNL   Lower Extremity Exam    Side: Right lower extremity  Side: Left lower extremity  Skin & Extremity Inspection: Skin color, temperature, and hair growth are WNL. No peripheral edema or cyanosis. No masses, redness, swelling, asymmetry, or associated skin lesions. No contractures.  Skin & Extremity Inspection: Skin  color, temperature, and hair growth are WNL. No peripheral edema or cyanosis. No masses, redness, swelling, asymmetry, or associated skin lesions. No contractures.  Functional ROM: Unrestricted ROM          Functional ROM: Unrestricted ROM          Muscle Tone/Strength: Functionally intact. No obvious neuro-muscular anomalies detected.  Muscle Tone/Strength: Functionally intact. No obvious neuro-muscular anomalies detected.  Sensory (Neurological): Unimpaired  Sensory (Neurological): Unimpaired  Palpation: No palpable anomalies  Palpation: No palpable anomalies   Assessment  Primary Diagnosis & Pertinent Problem List: The primary encounter diagnosis was Chronic pain syndrome. Diagnoses of Chronic low back pain (Primary Source of Pain) (Bilateral) (R>L), Bilateral foot pain, and Chronic neck pain (Tertiary source of pain) (Bilateral) (R>L) were also pertinent to this visit.  Status Diagnosis  Controlled Controlled Controlled 1. Chronic pain syndrome   2. Chronic low back pain (Primary Source of Pain) (Bilateral) (R>L)   3. Bilateral foot pain   4. Chronic neck pain (Tertiary source of pain) (Bilateral) (R>L)     Problems updated and reviewed during this visit: Problem  Chronic feet pain (Secondary source of pain) (Bilateral) (R>L)  Chronic lower extremity pain (Bilateral)  Chronic upper extremity pain (Left)  Lumbar facet syndrome (Bilateral) (R>L)  Chronic low back pain (Primary Source of Pain) (Bilateral) (R>L)  Chronic neck pain (Tertiary source of pain) (Bilateral) (R>L)  Lumbar central spinal stenosis (L1-2 and L4-5)   Plan of Care  Pharmacotherapy (Medications Ordered): No orders of the defined types were placed in this encounter.  New Prescriptions   No medications on file   Medications administered today: Lori Mckenzie had no medications administered during this visit.  Procedure Orders    No procedure(s) ordered today   Lab Orders  No laboratory test(s) ordered today    Imaging Orders  No imaging studies ordered today   Referral Orders  No referral(s) requested today    Interventional management options: Planned, scheduled, and/or pending:  Note: Avoid using steroids!   Considering:   Diagnostic bilateral cervical facetblock Diagnostic L1-2 lumbar epidural steroid injection Diagnostic, right-sided L4-5 lumbar epiduralsteroid injection Diagnostic bilateral intra-articular shoulderjoint injection Diagnostic bilateral suprascapular nerveblock Possible bilateral suprascapular nerve radiofrequencyablation Palliative left-sided L5-S1 lumbar epiduralsteroid injection  Diagnostic left S1 selective nerveroot block Diagnostic right-sided cervical epiduralsteroid injection Diagnostic T10-11 thoracic epiduralsteroid injection Diagnostic bilateral intra-articular hipjoint injection Possible bilateral hip radiofrequencyablation   Palliative PRN treatment(s):   Diagnostic L1-2 lumbar epidural steroidinjection Diagnostic, right-sided L4-5 lumbar epidural steroidinjection Diagnostic T10-11 thoracic epidural steroidinjection    Provider-requested follow-up: No Follow-up on file.  Future Appointments Date Time Provider [place]  10/26/2016 11:15 AM Milinda Pointer, MD [facility] None   Primary Care Physician: Ardine Eng, MD Location: [facility] Note by: Gaspar Cola, MD Date: 10/26/2016; Time: 8:56 AM

## 2016-10-26 NOTE — Care Management (Addendum)
Patient admitted from home with AMS.  Patient lives at home with husband.  Patient is self pay, states that she has submitted her information for recert of her Medicaid. PCP [facility] at [facility].  PT has assessed patient and recommends home health, however she does not have a payer and ambulated 180 feet. Patient has a RW and cane in the home for ambulation. [facility].  Pharmacist at [facility]  Confirmed that all of her prescriptions would be available after discharge at no cost to patient.  Patient to follow up with her pain clinic for refill on pain medication. Patient did not have qualifying sats for home O2. RNCM signing off.

## 2016-10-26 NOTE — Progress Notes (Signed)
Inpatient Diabetes Program Recommendations  AACE/ADA: New Consensus Statement on Inpatient Glycemic Control (2015)  Target Ranges:  Prepandial:   less than 140 mg/dL      Peak postprandial:   less than 180 mg/dL (1-2 hours)      Critically ill patients:  140 - 180 mg/dL  Results for ADRAINE, BIFFLE (MRN [id number]) as of 10/26/2016 11:21  Ref. Range 10/24/2016 07:42 10/24/2016 12:02 10/24/2016 16:31 10/24/2016 21:28 10/25/2016 12:20 10/25/2016 16:35 10/25/2016 21:07 10/26/2016 07:32  Glucose-Capillary Latest Ref Range: 65 - 99 mg/dL 170 (H) 143 (H) 149 (H) 186 (H) 150 (H) 221 (H) 209 (H) 183 (H)    Review of Glycemic Control  Diabetes history: DM2 Outpatient Diabetes medications: Levemir 50 units BID, Novolog 30 units TID with meals Current orders for Inpatient glycemic control: Levemir 25 units BID, Novolog 0-9 units TID with meals, Novolog 0-5 units QHS  Inpatient Diabetes Program Recommendations: Insulin - Meal Coverage: If patient is eating at least 50% of meals, please consider ordering Novolog 3 units TID with meals (in addition to Novolog correction scale).  Thanks, Barnie Alderman, RN, MSN, CDE Diabetes Coordinator Inpatient Diabetes Program [phone] (Team Pager from 8am to 5pm)

## 2016-10-26 NOTE — Progress Notes (Signed)
PT Cancellation Note  Patient Details Name: Lori Mckenzie MRN: [id number] DOB: 02-16-1965   Cancelled Treatment:    Reason Eval/Treat Not Completed: Patient declined, no reason specified. Treatment attempted; pt refused despite encouragement at this time. Pt notes fatigue and states she wishes to visit with her sister who is present in the room. Re attempt at a later time/date, as the schedule allows.    Larae Grooms, PTA 10/26/2016, 11:12 AM

## 2016-10-26 NOTE — Progress Notes (Signed)
Per Dr. Margaretmary Eddy okay for RN to Lasix. BP 99/62.

## 2016-10-26 NOTE — Progress Notes (Signed)
Lori Mckenzie  A and O x 4. VSS. Pt tolerating diet well. No complaints of pain or nausea. IV removed intact, prescriptions given. Pt voiced understanding of discharge instructions with no further questions. Pt discharged via wheelchair with RN.  Lynann Bologna MSN, RN-BC  Allergies as of 10/26/2016      Reactions   Tape Swelling   Vicodin [hydrocodone-acetaminophen] Itching, Rash      Medication List    STOP taking these medications   ibuprofen 600 MG tablet Commonly known as:  ADVIL,MOTRIN   insulin aspart 100 UNIT/ML injection Commonly known as:  novoLOG   oxyCODONE-acetaminophen 5-325 MG tablet Commonly known as:  ROXICET     TAKE these medications   albuterol 108 (90 Base) MCG/ACT inhaler Commonly known as:  PROVENTIL HFA;VENTOLIN HFA Inhale 2 puffs into the lungs every 6 (six) hours as needed for wheezing or shortness of breath.   ALPRAZolam 1 MG tablet Commonly known as:  XANAX Take 1 mg by mouth 3 (three) times daily.   cholecalciferol 1000 units tablet Commonly known as:  VITAMIN D Take 2,000 Units by mouth daily.   ciprofloxacin 500 MG tablet Commonly known as:  CIPRO Take 1 tablet (500 mg total) by mouth 2 (two) times daily.   citalopram 20 MG tablet Commonly known as:  CELEXA Take 20-30 mg by mouth daily.   diclofenac sodium 1 % Gel Commonly known as:  VOLTAREN Apply 4 g topically 4 (four) times daily.   dicyclomine 10 MG capsule Commonly known as:  BENTYL Take 10 mg by mouth 4 (four) times daily.   fluticasone 50 MCG/ACT nasal spray Commonly known as:  FLONASE Place 1-2 sprays into both nostrils daily.   furosemide 40 MG tablet Commonly known as:  LASIX Take 1 tablet (40 mg total) by mouth 2 (two) times daily. What changed:  how much to take   gabapentin 300 MG capsule Commonly known as:  NEURONTIN Take 600 mg by mouth 4 (four) times daily.   insulin detemir 100 UNIT/ML injection Commonly known as:  LEVEMIR Inject 0.25 mLs (25 Units total)  into the skin 2 (two) times daily. What changed:  how much to take   INTRINSI B12-FOLATE 800-500-20 MCG-MCG-MG Tabs Take 1 tablet by mouth daily. Reported on 04/06/2015   ipratropium-albuterol 0.5-2.5 (3) MG/3ML Soln Commonly known as:  DUONEB Take 3 mLs by nebulization every 4 (four) hours as needed. For shortness of breath/wheezing   lactulose 10 GM/15ML solution Commonly known as:  CHRONULAC Take 30 mLs (20 g total) by mouth 3 (three) times daily. What changed:  how much to take  when to take this   mupirocin ointment 2 % Commonly known as:  BACTROBAN Place 1 application into the nose 2 (two) times daily.   nystatin powder Commonly known as:  MYCOSTATIN/NYSTOP Apply 1 g topically 3 (three) times daily as needed. For irritation   oxycodone 5 MG capsule Commonly known as:  OXY-IR Take 1 capsule (5 mg total) by mouth every 6 (six) hours as needed. What changed:  Another medication with the same name was removed. Continue taking this medication, and follow the directions you see here.   pantoprazole 40 MG tablet Commonly known as:  PROTONIX Take 40 mg by mouth 2 (two) times daily before a meal.   potassium chloride SA 20 MEQ tablet Commonly known as:  K-DUR,KLOR-CON Take 10-20 mEq by mouth daily as needed. For high blood pressure   promethazine 12.5 MG tablet Commonly known as:  PHENERGAN  Take 2 tablets (25 mg total) by mouth every 6 (six) hours as needed for nausea or vomiting.   rizatriptan 10 MG tablet Commonly known as:  MAXALT Take 10 mg by mouth as needed for migraine.   spironolactone 100 MG tablet Commonly known as:  ALDACTONE Take 100 mg by mouth daily.   vitamin B-12 1000 MCG tablet Commonly known as:  CYANOCOBALAMIN Take 2,000 mcg by mouth daily.   XIFAXAN 550 MG Tabs tablet Generic drug:  rifaximin Take 550 mg by mouth 2 (two) times daily.            Durable Medical Equipment        Start     Ordered   10/25/16 1456  For home use only DME  Walker rolling  Once    Comments:  RW with 5" wheels  Question:  Patient needs a walker to treat with the following condition  Answer:  Weak   10/25/16 1456      Vitals:   10/26/16 0516 10/26/16 1109  BP: 100/66 99/62  Pulse: 81 89  Resp: 18   Temp: 98 F (36.7 C)   SpO2: 93%

## 2016-10-26 NOTE — Progress Notes (Signed)
SATURATION QUALIFICATIONS: (This note is used to comply with regulatory documentation for home oxygen)  Patient Saturations on Room Air at Rest = 93  Patient Saturations on Room Air while Ambulating = 91   Please briefly explain why patient needs home oxygen:  Does not qualify for home O2.

## 2016-10-26 NOTE — Discharge Summary (Addendum)
[facility] [place] NAME: Chelsi Warr    MR#:  [id number]  DATE OF BIRTH:  15-Jun-1965  DATE OF ADMISSION:  10/21/2016 ADMITTING PHYSICIAN: Epifanio Lesches, MD  DATE OF DISCHARGE: 10/26/16  PRIMARY CARE PHYSICIAN: Ashkin, Neldon Labella, MD    ADMISSION DIAGNOSIS:  Altered mental status [R41.82] Altered mental status, unspecified altered mental status type [R41.82]  DISCHARGE DIAGNOSIS:  Active Problems:   Hepatic encephalopathy ([place]) uti  SECONDARY DIAGNOSIS:   Past Medical History:  Diagnosis Date  . Abdominal abscess 08/25/2014  . Acid reflux 08/10/2010  . Acute cervical myofascial strain 02/09/2016  . Anxiety   . Ascites   . Asthma   . Back pain   . Bile leak, postoperative 07/17/2014  . Brittle bone disease   . Cancer ([place])    Uteriine  ca 67yr ago partial hysterectomy  . Cervical disc disease   . Chronic kidney disease   . Collagen vascular disease ([place]    RA  3-4 yrs ago  . COPD [place] disease) [place]   . Diabetes mellitus without complication ([place]   . GERD (gastroesophageal reflux disease)   . Hypertension   . Hypothyroidism   . Left upper quadrant pain 01/09/2014  . Major depressive disorder with single episode 12/05/2011  . Major depressive disorder, single episode 12/05/2011  . Migraines   . NASH (nonalcoholic steatohepatitis)   . Respiratory infection    2/17  . Shock ([place] 09/18/2014  . Sleep apnea   . Sleep apnea   . Syncope 11/16/2014  . Thyroid disease   . TIA (transient ischemic attack)     HOSPITAL COURSE:   HPI  DDonalee Gaumond is a 51y.o. female with a known history of liver cirrhosis came in by ambulance because of altered mental status. Son had decided down in the car. Patient was lethargic with slurred speech, saturation 88% on room air. Patient found to havePh  7.29, PCO2 65.at the intensive care unit for BiPAP, discussed with intensivist on call  # 1 Altered mental status  secondary to hepatic encephalopathy, hypercapnic respiratory failure: Resolved  Initially was on BiPAP, off BiPAP, continue lactulose, rifaximin.    2.Nonalcoholic liver cirrhosis:thrombocytopenia  Restarted Lasix, Aldactone History of hepatic encephalopathy, patient ammonia is 55  Continue Lactulose. No active bleeding   #3 history of anxiety,, depression: Restarted the medication for depression and anxiety.   #4 diabetes mellitus type 2:Continue sliding scale with coverage,a.m. And low-dose Lantus.   # Chronic pain syndrome: Outpatient follow-up with pain management Dr. NAlmyra Freeand continue narcoticsas prescribed by him Reports ran out of the pain meds and will f/u with pain management today soon after discharge  # Metabolic encephalopathy, UTI: Started IV antibiotics, follow urine cultures greater than 100,000 colonies of enterococcus  final sensitive to Macrobid and levofloxacin will discharge patient with by mouth ciprofloxacin  # rash - prob fungal- nystatin  PT evaluation-recommending home health PT with 24-hour supervision and rolling walker with 5 inch wheels  DISCHARGE CONDITIONS:   fair CONSULTS OBTAINED:     PROCEDURES  None   DRUG ALLERGIES:   Allergies  Allergen Reactions  . Tape Swelling  . Vicodin [Hydrocodone-Acetaminophen] Itching and Rash    DISCHARGE MEDICATIONS:   Current Discharge Medication List    START taking these medications   Details  ciprofloxacin (CIPRO) 500 MG tablet Take 1 tablet (500 mg total) by mouth 2 (two) times daily. Qty: 10 tablet, Refills:  0    mupirocin ointment (BACTROBAN) 2 % Place 1 application into the nose 2 (two) times daily. Qty: 22 g, Refills: 0      CONTINUE these medications which have CHANGED   Details  furosemide (LASIX) 40 MG tablet Take 1 tablet (40 mg total) by mouth 2 (two) times daily. Qty: 60 tablet, Refills: 0    insulin detemir (LEVEMIR) 100 UNIT/ML injection Inject 0.25 mLs (25 Units  total) into the skin 2 (two) times daily. Qty: 100 mL, Refills: 1    lactulose (CHRONULAC) 10 GM/15ML solution Take 30 mLs (20 g total) by mouth 3 (three) times daily. Qty: 1892 mL, Refills: 0    nystatin (MYCOSTATIN/NYSTOP) powder Apply 1 g topically 3 (three) times daily as needed. For irritation Qty: 30 g, Refills: 0    oxycodone (OXY-IR) 5 MG capsule Take 1 capsule (5 mg total) by mouth every 6 (six) hours as needed. Refills: 0      CONTINUE these medications which have NOT CHANGED   Details  albuterol (PROVENTIL HFA;VENTOLIN HFA) 108 (90 Base) MCG/ACT inhaler Inhale 2 puffs into the lungs every 6 (six) hours as needed for wheezing or shortness of breath.    ALPRAZolam (XANAX) 1 MG tablet Take 1 mg by mouth 3 (three) times daily.     cholecalciferol (VITAMIN D) 1000 units tablet Take 2,000 Units by mouth daily.    citalopram (CELEXA) 20 MG tablet Take 20-30 mg by mouth daily.     diclofenac sodium (VOLTAREN) 1 % GEL Apply 4 g topically 4 (four) times daily. Qty: 100 g, Refills: 2   Associated Diagnoses: Primary osteoarthritis involving multiple joints    dicyclomine (BENTYL) 10 MG capsule Take 10 mg by mouth 4 (four) times daily.     fluticasone (FLONASE) 50 MCG/ACT nasal spray Place 1-2 sprays into both nostrils daily.     Folate-B12-Intrinsic Factor (INTRINSI B12-FOLATE) 025-427-06 MCG-MCG-MG TABS Take 1 tablet by mouth daily. Reported on 04/06/2015    gabapentin (NEURONTIN) 300 MG capsule Take 600 mg by mouth 4 (four) times daily.     ipratropium-albuterol (DUONEB) 0.5-2.5 (3) MG/3ML SOLN Take 3 mLs by nebulization every 4 (four) hours as needed. For shortness of breath/wheezing    pantoprazole (PROTONIX) 40 MG tablet Take 40 mg by mouth 2 (two) times daily before a meal.    promethazine (PHENERGAN) 12.5 MG tablet Take 2 tablets (25 mg total) by mouth every 6 (six) hours as needed for nausea or vomiting. Qty: 30 tablet, Refills: 0    rifaximin (XIFAXAN) 550 MG TABS  tablet Take 550 mg by mouth 2 (two) times daily.     rizatriptan (MAXALT) 10 MG tablet Take 10 mg by mouth as needed for migraine.     vitamin B-12 (CYANOCOBALAMIN) 1000 MCG tablet Take 2,000 mcg by mouth daily.    potassium chloride SA (K-DUR,KLOR-CON) 20 MEQ tablet Take 10-20 mEq by mouth daily as needed. For high blood pressure    spironolactone (ALDACTONE) 100 MG tablet Take 100 mg by mouth daily.       STOP taking these medications     insulin aspart (NOVOLOG) 100 UNIT/ML injection      ibuprofen (ADVIL,MOTRIN) 600 MG tablet      oxyCODONE (OXY IR/ROXICODONE) 5 MG immediate release tablet      oxyCODONE-acetaminophen (ROXICET) 5-325 MG tablet          DISCHARGE INSTRUCTIONS:   Follow-up with primary care physician and pain management in 1-2 days Outpatient follow-up with gastroenterology  in 2 weeks  DIET:  Cardiac diet and Diabetic diet  DISCHARGE CONDITION:  Fair  ACTIVITY:  Activity as tolerated per PT   OXYGEN:  Home Oxygen: no   Oxygen Delivery: mnone  DISCHARGE LOCATION:  home   If you experience worsening of your admission symptoms, develop shortness of breath, life threatening emergency, suicidal or homicidal thoughts you must seek medical attention immediately by calling 911 or calling your MD immediately  if symptoms less severe.  You Must read complete instructions/literature along with all the possible adverse reactions/side effects for all the Medicines you take and that have been prescribed to you. Take any new Medicines after you have completely understood and accpet all the possible adverse reactions/side effects.   Please note  You were cared for by a hospitalist during your hospital stay. If you have any questions about your discharge medications or the care you received while you were in the hospital after you are discharged, you can call the unit and asked to speak with the hospitalist on call if the hospitalist that took care of you is not  available. Once you are discharged, your primary care physician will handle any further medical issues. Please note that NO REFILLS for any discharge medications will be authorized once you are discharged, as it is imperative that you return to your primary care physician (or establish a relationship with a primary care physician if you do not have one) for your aftercare needs so that they can reassess your need for medications and monitor your lab values.     Today  Chief Complaint  Patient presents with  . Altered Mental Status   Patient is feeling fine according to husband she is at her baseline and wants to go home Reports ran out of pain medication and will f/u with  pain management Dr. Pete Glatter today soon after discharge ROS:  CONSTITUTIONAL: Denies fevers, chills. Denies any fatigue, weakness.  EYES: Denies blurry vision, double vision, eye pain. EARS, NOSE, THROAT: Denies tinnitus, ear pain, hearing loss. RESPIRATORY: Denies cough, wheeze, shortness of breath.  CARDIOVASCULAR: Denies chest pain, palpitations, edema.  GASTROINTESTINAL: Denies nausea, vomiting, diarrhea, abdominal pain. Denies bright red blood per rectum. GENITOURINARY: Denies dysuria, hematuria. ENDOCRINE: Denies nocturia or thyroid problems. HEMATOLOGIC AND LYMPHATIC: Denies easy bruising or bleeding. SKIN: Denies rash or lesion. MUSCULOSKELETAL: Denies pain in neck, back, shoulder, knees, hips or arthritic symptoms.  NEUROLOGIC: Denies paralysis, paresthesias.  PSYCHIATRIC: Denies anxiety or depressive symptoms.   VITAL SIGNS:  Blood pressure 99/62, pulse 89, temperature 98 F (36.7 C), temperature source Oral, resp. rate 18, height 5' 2"  (1.575 m), weight 121.6 kg (268 lb 1.3 oz), SpO2 93 %.  I/O:    Intake/Output Summary (Last 24 hours) at 10/26/16 1341 Last data filed at 10/26/16 0832  Gross per 24 hour  Intake               50 ml  Output                0 ml  Net               50 ml    PHYSICAL  EXAMINATION:  GENERAL:  51 y.o.-year-old patient lying in the bed with no acute distress.  EYES: Pupils equal, round, reactive to light and accommodation. No scleral icterus. Extraocular muscles intact.  HEENT: Head atraumatic, normocephalic. Oropharynx and nasopharynx clear.  NECK:  Supple, no jugular venous distention. No thyroid enlargement, no tenderness.  LUNGS: Normal  breath sounds bilaterally, no wheezing, rales,rhonchi or crepitation. No use of accessory muscles of respiration.  CARDIOVASCULAR: S1, S2 normal. No murmurs, rubs, or gallops.  ABDOMEN: Soft, non-tender, non-distended. Bowel sounds present. No organomegaly or mass.  EXTREMITIES: No pedal edema, cyanosis, or clubbing.  NEUROLOGIC: Cranial nerves II through XII are intact. Muscle strength 5/5 in all extremities. Sensation intact. Gait not checked.  PSYCHIATRIC: The patient is alert and oriented x 3.  SKIN: No obvious rash, lesion, or ulcer.   DATA REVIEW:   CBC  Recent Labs Lab 10/25/16 0443  WBC 2.7*  HGB 12.7  HCT 37.3  PLT 69*    Chemistries   Recent Labs Lab 10/24/16 1617 10/25/16 0443  NA 140  --   K 3.2*  --   CL 97*  --   CO2 33*  --   GLUCOSE 148*  --   BUN 7  --   CREATININE 0.64  --   CALCIUM 8.8*  --   MG  --  1.7  AST 33  --   ALT 30  --   ALKPHOS 51  --   BILITOT 1.3*  --     Cardiac Enzymes No results for input(s): TROPONINI in the last 168 hours.  Microbiology Results  Results for orders placed or performed during the hospital encounter of 10/21/16  MRSA PCR Screening     Status: Abnormal   Collection Time: 10/21/16  9:56 PM  Result Value Ref Range Status   MRSA by PCR POSITIVE (A) NEGATIVE Final    Comment:        The GeneXpert MRSA Assay (FDA approved for NASAL specimens only), is one component of a comprehensive MRSA colonization surveillance program. It is not intended to diagnose MRSA infection nor to guide or monitor treatment for MRSA infections. RESULT CALLED  TO, READ BACK BY AND VERIFIED WITH: OKIESHA BALDWIN AT 0010 ON 10/22/16 RWW   Urine Culture     Status: Abnormal   Collection Time: 10/22/16  3:47 PM  Result Value Ref Range Status   Specimen Description URINE, RANDOM  Final   Special Requests NONE  Final   Culture >=100,000 COLONIES/mL ENTEROCOCCUS FAECALIS (A)  Final   Report Status 10/26/2016 FINAL  Final   Organism ID, Bacteria ENTEROCOCCUS FAECALIS (A)  Final      Susceptibility   Enterococcus faecalis - MIC*    AMPICILLIN <=2 SENSITIVE Sensitive     LEVOFLOXACIN 1 SENSITIVE Sensitive     NITROFURANTOIN <=16 SENSITIVE Sensitive     VANCOMYCIN 1 SENSITIVE Sensitive     * >=100,000 COLONIES/mL ENTEROCOCCUS FAECALIS    RADIOLOGY:  No results found.  EKG:   Orders placed or performed during the hospital encounter of 10/21/16  . EKG 12-Lead  . EKG 12-Lead      Management plans discussed with the patient, family and they are in agreement.  CODE STATUS:     Code Status Orders        Start     Ordered   10/21/16 1924  Full code  Continuous     10/21/16 1929    Code Status History    Date Active Date Inactive Code Status Order ID Comments User Context   09/23/2016  7:15 PM 09/25/2016  5:46 PM Full Code [id number]  Demetrios Loll, MD Inpatient   06/28/2016  9:22 PM 06/30/2016  7:09 PM Full Code [id number]  Henreitta Leber, MD Inpatient   03/12/2015 12:09 AM 03/13/2015  5:23 PM Full Code  [id number]  Gladstone Lighter, MD Inpatient   10/04/2014 12:59 PM 10/06/2014  8:20 PM Full Code [id number]  Baxter Hire, MD Inpatient   09/05/2014  2:29 AM 09/08/2014  6:10 PM Full Code [id number]  Lance Coon, MD Inpatient   08/25/2014  6:23 PM 08/30/2014  1:48 PM Full Code [id number]  Florene Glen, MD ED   08/03/2014  5:12 PM 08/08/2014  3:16 PM Full Code [id number]  Marlyce Huge, MD ED   07/24/2014  6:45 PM 07/30/2014  5:31 PM Full Code [id number]  Florene Glen, MD Inpatient   07/23/2014 11:42 AM 07/24/2014  3:27 AM Full Code [id number]   Sabino Dick, MD [place]   07/12/2014 11:25 PM 07/16/2014  4:30 PM Full Code [id number]  Lance Coon, MD Inpatient    Advance Directive Documentation     Most Recent Value  Type of Advance Directive  Healthcare Power of Attorney  Pre-existing out of facility DNR order (yellow form or pink MOST form)  -  "MOST" Form in Place?  -      TOTAL TIME TAKING CARE OF THIS PATIENT: 45 minutes.   Note: This dictation was prepared with Dragon dictation along with smaller phrase technology. Any transcriptional errors that result from this process are unintentional.   @[facility] @  on 10/26/2016 at 1:41 PM  Between 7am to 6pm - Pager - [phone]  After 6pm go to [email or web address] - password [email or web address] [facility]  Office  [phone]  CC: Primary care physician; Ashkin, Neldon Labella, MD

## 2016-10-27 ENCOUNTER — Encounter: Payer: Self-pay | Admitting: Nurse Practitioner

## 2016-10-27 ENCOUNTER — Ambulatory Visit: Payer: Medicaid Other | Attending: Nurse Practitioner | Admitting: Nurse Practitioner

## 2016-10-27 VITALS — BP 126/87 | HR 104 | Temp 97.7°F | Resp 18 | Ht 62.0 in | Wt 270.0 lb

## 2016-10-27 DIAGNOSIS — E538 Deficiency of other specified B group vitamins: Secondary | ICD-10-CM | POA: Diagnosis not present

## 2016-10-27 DIAGNOSIS — E1165 Type 2 diabetes mellitus with hyperglycemia: Secondary | ICD-10-CM | POA: Diagnosis not present

## 2016-10-27 DIAGNOSIS — M7918 Myalgia, other site: Secondary | ICD-10-CM | POA: Diagnosis not present

## 2016-10-27 DIAGNOSIS — Z801 Family history of malignant neoplasm of trachea, bronchus and lung: Secondary | ICD-10-CM | POA: Insufficient documentation

## 2016-10-27 DIAGNOSIS — Z79899 Other long term (current) drug therapy: Secondary | ICD-10-CM | POA: Insufficient documentation

## 2016-10-27 DIAGNOSIS — M818 Other osteoporosis without current pathological fracture: Secondary | ICD-10-CM | POA: Diagnosis not present

## 2016-10-27 DIAGNOSIS — M4804 Spinal stenosis, thoracic region: Secondary | ICD-10-CM | POA: Diagnosis not present

## 2016-10-27 DIAGNOSIS — Z8249 Family history of ischemic heart disease and other diseases of the circulatory system: Secondary | ICD-10-CM | POA: Insufficient documentation

## 2016-10-27 DIAGNOSIS — M5442 Lumbago with sciatica, left side: Secondary | ICD-10-CM

## 2016-10-27 DIAGNOSIS — K219 Gastro-esophageal reflux disease without esophagitis: Secondary | ICD-10-CM | POA: Insufficient documentation

## 2016-10-27 DIAGNOSIS — K7460 Unspecified cirrhosis of liver: Secondary | ICD-10-CM | POA: Insufficient documentation

## 2016-10-27 DIAGNOSIS — Z825 Family history of asthma and other chronic lower respiratory diseases: Secondary | ICD-10-CM | POA: Insufficient documentation

## 2016-10-27 DIAGNOSIS — L02211 Cutaneous abscess of abdominal wall: Secondary | ICD-10-CM | POA: Insufficient documentation

## 2016-10-27 DIAGNOSIS — Z8673 Personal history of transient ischemic attack (TIA), and cerebral infarction without residual deficits: Secondary | ICD-10-CM | POA: Insufficient documentation

## 2016-10-27 DIAGNOSIS — R55 Syncope and collapse: Secondary | ICD-10-CM | POA: Diagnosis not present

## 2016-10-27 DIAGNOSIS — G8918 Other acute postprocedural pain: Secondary | ICD-10-CM | POA: Diagnosis not present

## 2016-10-27 DIAGNOSIS — Z794 Long term (current) use of insulin: Secondary | ICD-10-CM | POA: Insufficient documentation

## 2016-10-27 DIAGNOSIS — K5900 Constipation, unspecified: Secondary | ICD-10-CM | POA: Diagnosis not present

## 2016-10-27 DIAGNOSIS — F329 Major depressive disorder, single episode, unspecified: Secondary | ICD-10-CM | POA: Diagnosis not present

## 2016-10-27 DIAGNOSIS — K7581 Nonalcoholic steatohepatitis (NASH): Secondary | ICD-10-CM | POA: Insufficient documentation

## 2016-10-27 DIAGNOSIS — Z9049 Acquired absence of other specified parts of digestive tract: Secondary | ICD-10-CM | POA: Insufficient documentation

## 2016-10-27 DIAGNOSIS — G8929 Other chronic pain: Secondary | ICD-10-CM

## 2016-10-27 DIAGNOSIS — Z9889 Other specified postprocedural states: Secondary | ICD-10-CM | POA: Insufficient documentation

## 2016-10-27 DIAGNOSIS — R748 Abnormal levels of other serum enzymes: Secondary | ICD-10-CM | POA: Insufficient documentation

## 2016-10-27 DIAGNOSIS — E559 Vitamin D deficiency, unspecified: Secondary | ICD-10-CM | POA: Diagnosis not present

## 2016-10-27 DIAGNOSIS — F419 Anxiety disorder, unspecified: Secondary | ICD-10-CM | POA: Insufficient documentation

## 2016-10-27 DIAGNOSIS — M5441 Lumbago with sciatica, right side: Secondary | ICD-10-CM

## 2016-10-27 DIAGNOSIS — M47897 Other spondylosis, lumbosacral region: Secondary | ICD-10-CM | POA: Insufficient documentation

## 2016-10-27 DIAGNOSIS — Z885 Allergy status to narcotic agent status: Secondary | ICD-10-CM | POA: Insufficient documentation

## 2016-10-27 DIAGNOSIS — R7982 Elevated C-reactive protein (CRP): Secondary | ICD-10-CM | POA: Diagnosis not present

## 2016-10-27 DIAGNOSIS — G894 Chronic pain syndrome: Secondary | ICD-10-CM | POA: Diagnosis present

## 2016-10-27 DIAGNOSIS — M47812 Spondylosis without myelopathy or radiculopathy, cervical region: Secondary | ICD-10-CM | POA: Insufficient documentation

## 2016-10-27 DIAGNOSIS — R188 Other ascites: Secondary | ICD-10-CM | POA: Diagnosis not present

## 2016-10-27 DIAGNOSIS — G5603 Carpal tunnel syndrome, bilateral upper limbs: Secondary | ICD-10-CM | POA: Diagnosis not present

## 2016-10-27 DIAGNOSIS — J441 Chronic obstructive pulmonary disease with (acute) exacerbation: Secondary | ICD-10-CM | POA: Diagnosis not present

## 2016-10-27 DIAGNOSIS — E039 Hypothyroidism, unspecified: Secondary | ICD-10-CM | POA: Insufficient documentation

## 2016-10-27 DIAGNOSIS — M5116 Intervertebral disc disorders with radiculopathy, lumbar region: Secondary | ICD-10-CM | POA: Diagnosis not present

## 2016-10-27 DIAGNOSIS — K7290 Hepatic failure, unspecified without coma: Secondary | ICD-10-CM | POA: Insufficient documentation

## 2016-10-27 DIAGNOSIS — M48061 Spinal stenosis, lumbar region without neurogenic claudication: Secondary | ICD-10-CM | POA: Insufficient documentation

## 2016-10-27 DIAGNOSIS — S161XXA Strain of muscle, fascia and tendon at neck level, initial encounter: Secondary | ICD-10-CM | POA: Insufficient documentation

## 2016-10-27 DIAGNOSIS — M542 Cervicalgia: Secondary | ICD-10-CM

## 2016-10-27 DIAGNOSIS — J449 Chronic obstructive pulmonary disease, unspecified: Secondary | ICD-10-CM | POA: Insufficient documentation

## 2016-10-27 DIAGNOSIS — I129 Hypertensive chronic kidney disease with stage 1 through stage 4 chronic kidney disease, or unspecified chronic kidney disease: Secondary | ICD-10-CM | POA: Insufficient documentation

## 2016-10-27 DIAGNOSIS — R4182 Altered mental status, unspecified: Secondary | ICD-10-CM | POA: Insufficient documentation

## 2016-10-27 DIAGNOSIS — Z889 Allergy status to unspecified drugs, medicaments and biological substances status: Secondary | ICD-10-CM | POA: Insufficient documentation

## 2016-10-27 DIAGNOSIS — M533 Sacrococcygeal disorders, not elsewhere classified: Secondary | ICD-10-CM

## 2016-10-27 NOTE — Progress Notes (Signed)
Safety precautions to be maintained throughout the outpatient stay will include: orient to surroundings, keep bed in low position, maintain call bell within reach at all times, provide assistance with transfer out of bed and ambulation.  

## 2016-10-27 NOTE — Patient Instructions (Signed)
  BMI Assessment: Estimated body mass index is 49.38 kg/m as calculated from the following:   Height as of this encounter: 5' 2"  (1.575 m).   Weight as of this encounter: 270 lb (122.5 kg).  BMI interpretation table: BMI level Category Range association with higher incidence of chronic pain  <18 kg/m2 Underweight   18.5-24.9 kg/m2 Ideal body weight   25-29.9 kg/m2 Overweight Increased incidence by 20%  30-34.9 kg/m2 Obese (Class I) Increased incidence by 68%  35-39.9 kg/m2 Severe obesity (Class II) Increased incidence by 136%  >40 kg/m2 Extreme obesity (Class III) Increased incidence by 254%   BMI Readings from Last 4 Encounters:  10/27/16 49.38 kg/m  10/22/16 49.03 kg/m  10/02/16 49.02 kg/m  09/28/16 49.02 kg/m   Wt Readings from Last 4 Encounters:  10/27/16 270 lb (122.5 kg)  10/22/16 268 lb 1.3 oz (121.6 kg)  10/02/16 268 lb (121.6 kg)  09/28/16 268 lb (121.6 kg)

## 2016-10-27 NOTE — Progress Notes (Signed)
Patient's Name: Lori Mckenzie  MRN: [id number]  Referring Provider: Ardine Eng, MD  DOB: 1965/08/15  PCP: Ardine Eng, MD  DOS: 10/27/2016  Note by: Vevelyn Francois NP  Service setting: Ambulatory outpatient  Specialty: Interventional Pain Management  Location: [facility] (AMB) Pain Management Facility    Patient type: Established    Primary Reason(s) for Visit: Evaluation of chronic illnesses with exacerbation, or progression (Level of risk: moderate) CC: Back Pain (low); Neck Pain; and Hip Pain (bilateral)  HPI  Lori Mckenzie is a 51 y.o. year old, female patient, who comes today for a follow-up evaluation. She has Type 2 diabetes mellitus ([place]); COPD with acute exacerbation (HCC); GERD (gastroesophageal reflux disease); OSA on CPAP; Anxiety; Steatohepatitis; Dysthymia; Other social stressor; Ascites; NASH (nonalcoholic steatohepatitis); Hypokalemia; Opiate use (75 MME/Day); Long term prescription opiate use; Long term current use of opiate analgesic; Encounter for therapeutic drug level monitoring; Chronic epigastric abdominal pain; Chronic low back pain (Primary Source of Pain) (Bilateral) (R>L); Chronic neck pain (Tertiary source of pain) (Bilateral) (R>L); Breath shortness; Diastolic dysfunction; Clinical depression; Airway hyperreactivity; Essential (primary) hypertension; 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, L1-2, & L4-5); Lumbar and sacral osteoarthritis; Myofascial pain; Drowsiness; Episode of syncope; Subacute lumbar radiculopathy (left side) (S1 dermatome); Vitamin D deficiency; B12 deficiency; Folate deficiency; Thoracic radiculitis; Elevated sedimentation rate; Elevated C-reactive protein (CRP); Lumbar facet syndrome (Bilateral) (R>L); Cervical spondylosis; Lumbar spondylosis; Encounter for chronic pain management; Chronic shoulder pain (Bilateral) (R>L); Chronic carpal tunnel syndrome (Bilateral); Chronic hip pain (Bilateral) (L>R); Chronic upper back pain (Bilateral) (L>R); Osteoporosis,  idiopathic; Abnormal MRI, lumbar spine (02/03/2015); Hepatic encephalopathy ([place]); Radicular pain of thoracic region; Altered mental status; Cirrhosis of liver without ascites ([place]); Elevated liver enzymes; Opiate withdrawal ([place]); Chronic pain syndrome; Asthma; Lumbar spinal stenosis; Lumbosacral spondylosis without myelopathy; Neuromyositis; Somnolence; Lumbar central spinal stenosis (L1-2 and L4-5); Spondylosis of lumbar region without myelopathy or radiculopathy; Allodynia; Osteoarthritis; Chronic upper extremity pain (Left); Chronic radicular cervical pain (L); Chronic sacroiliac joint pain (Bilateral) (L>R); Diabetes mellitus, insulin dependent (IDDM), uncontrolled ([place]); Chronic lower extremity pain (Bilateral); Spinal stenosis, thoracic region (T10-11); Nausea without vomiting; Cirrhosis ([place]); Constipation; Hyperglycemia; Uncontrolled diabetes mellitus ([place]); Abdominal pain; Low back pain due to L1-2 disc extrusion (caudal) (Left); Acute postoperative pain; Acute hepatic encephalopathy; and Chronic feet pain (Secondary source of pain) (Bilateral) (R>L) on her problem list. Lori Mckenzie was last seen on 06/23/2016. Her primarily concern today is the Back Pain (low); Neck Pain; and Hip Pain (bilateral)  Pain Assessment: Location: Lower Back Radiating: hips Onset: More than a month ago Duration: Chronic pain Quality: Aching, Constant, Discomfort, Throbbing, Tender, Shooting Severity: 3 /10 (self-reported pain score)  Note: Reported level is compatible with observation.                    Effect on ADL:   Timing: Constant Modifying factors: rest medications  Further details on both, my assessment(s), as well as the proposed treatment plan, please see below. Lori Lori Mckenzie was to be seen on yesterday but states she was unable secondary to being in the hospital. She admits that she was there secondary to increased pain. She was encouraged to follow up here secondary to needing pain medication. She has been on  narocoitic in the past however secondary to noncompliance she is no longer a candidate for opioids. She does have interventional therapy to her lumbar spine.  She is SP Lumbar RFA. She received post procedure Oxycodone secondary to the acute pain.that is caused  with this procedure. She denies any exacurbation of her pain.   Laboratory Chemistry  Inflammation Markers (CRP: Acute Phase) (ESR: Chronic Phase) Lab Results  Component Value Date   CRP <0.8 04/12/2016   ESRSEDRATE 4 04/12/2016                 Renal Function Markers Lab Results  Component Value Date   BUN 7 10/24/2016   CREATININE 0.64 10/24/2016   GFRAA >60 10/24/2016   GFRNONAA >60 10/24/2016                 Hepatic Function Markers Lab Results  Component Value Date   AST 33 10/24/2016   ALT 30 10/24/2016   ALBUMIN 3.6 10/24/2016   ALKPHOS 51 10/24/2016                 Electrolytes Lab Results  Component Value Date   NA 140 10/24/2016   K 3.2 (L) 10/24/2016   CL 97 (L) 10/24/2016   CALCIUM 8.8 (L) 10/24/2016   MG 1.7 10/25/2016                 Neuropathy Markers Lab Results  Component Value Date   VITAMINB12 272 04/12/2016                 Bone Pathology Markers Lab Results  Component Value Date   ALKPHOS 51 10/24/2016   VD25OH 14.3 (L) 01/08/2015   VD125OH2TOT 28.8 01/08/2015   25OHVITD1 36 04/12/2016   25OHVITD2 3.9 04/12/2016   25OHVITD3 32 04/12/2016   CALCIUM 8.8 (L) 10/24/2016                 Coagulation Parameters Lab Results  Component Value Date   INR 1.18 12/01/2015   LABPROT 15.1 12/01/2015   APTT 24.6 08/26/2013   PLT 69 (L) 10/25/2016                 Cardiovascular Markers Lab Results  Component Value Date   BNP 11 12/11/2012   HGB 12.7 10/25/2016   HCT 37.3 10/25/2016                 Note: Lab results reviewed.  Recent Diagnostic Imaging Review   Shoulder Imaging:  Shoulder-R DG:  Results for orders placed during the hospital encounter of 08/25/14  DG Shoulder  Right   Narrative CLINICAL DATA:  Right shoulder pain.  EXAM: RIGHT SHOULDER - 2+ VIEW  COMPARISON:  None.  FINDINGS: There is no evidence of fracture or dislocation. There is no evidence of arthropathy or other focal bone abnormality. Soft tissues are unremarkable.  IMPRESSION: Negative.   Electronically Signed   By: Kerby Moors M.D.   On: 08/26/2014 17:40    Thoracic Imaging: Thoracic MR wo contrast:  Results for orders placed during the hospital encounter of 03/06/15  MR Thoracic Spine Wo Contrast   Narrative CLINICAL DATA:  Diffuse mid back pain with bilateral arm and leg pain since 2008.  EXAM: MRI THORACIC SPINE WITHOUT CONTRAST  TECHNIQUE: Multiplanar, multisequence MR imaging of the thoracic spine was performed. No intravenous contrast was administered.  COMPARISON:  08/06/2014  FINDINGS: Small hemangiomas noted in the T3 and T11 vertebra. There is increased T2 signal in the right transverse process of T6, probably with increased T1 signal on image 3 series 5, and with trabecular coarsening in this vicinity on prior exams including the chest CT from 08/05/2007, favoring hemangioma.  Several scattered tiny T2 hyperintense lesions are present in  the liver and are nonspecific.  No significant abnormal spinal cord signal is observed. There is mild dextroconvex thoracic scoliosis but no malalignment.  Several small Schmorl's nodes are present in the lower thoracic spine.  Additional findings at individual levels are as follows:  T1-2:  No impingement.  Central disc protrusion.  T2-3: Moderate right foraminal stenosis due to right facet arthropathy. Central disc protrusion.  T3-4:  Mild right foraminal stenosis due to facet arthropathy.  T4-5:  Mild right foraminal stenosis due to facet arthropathy.  T5-6: Mild left eccentric central narrowing of the thecal sac due to left facet arthropathy, image 17 series 7.  T6-7: Mild left foraminal  stenosis due to intervertebral spurring. Disc bulge.  T7-8: Borderline central narrowing of the thecal sac due to focal right paracentral spurring.  T8-9:  Mild bilateral foraminal stenosis due to facet arthropathy.  T9-10:  Borderline left foraminal stenosis due to facet arthropathy.  T10-11: Moderate central narrowing of the thecal sac due to a central disc protrusion with associated spurring. Mild left foraminal stenosis due to facet arthropathy.  T11-12: Mild left foraminal stenosis due to left paracentral disc protrusion and facet spurring.  T12-L1:  No impingement.  Right paracentral disc protrusion.  L1-2: No impingement. Central disc protrusion. This level is only included on the parasagittal images.  IMPRESSION: 1. Spondylosis and degenerative disc disease in the thoracic spine, causing moderate impingement at T2-3 and T10-11 ; and mild impingement at T3-4, T4-5, T5-6, T6-7, T8-9, and T11-12, as detailed above. 2. Several vertebral hemangiomas, the largest of which is in the right T6 transverse process. 3. Scattered tiny T2 hyperintense liver lesions are probably cysts and have been seen on prior CT scans, but technically nonspecific on today's MRI.   Electronically Signed   By: Van Clines M.D.   On: 03/06/2015 11:52    Lumbosacral Imaging: Lumbar MR wo contrast:  Results for orders placed during the hospital encounter of 02/03/15  MR Lumbar Spine Wo Contrast   Narrative CLINICAL DATA:  51 year old female with right side lumbar back pain for 2 weeks, progressively increasing. Left S1 radicular pain. Initial encounter. Remote fall in 2008.  EXAM: MRI LUMBAR SPINE WITHOUT CONTRAST  TECHNIQUE: Multiplanar, multisequence MR imaging of the lumbar spine was performed. No intravenous contrast was administered.  COMPARISON:  CT Abdomen and Pelvis 01/29/2015 and earlier  FINDINGS: Normal lumbar segmentation demonstrated on the  comparison. Straightening of lumbar lordosis, otherwise normal vertebral height and alignment. Bone marrow signal within normal limits. No marrow edema or evidence of acute osseous abnormality. Visible sacrum intact.  No signal abnormality in the visualized lower thoracic spinal cord. Conus medullaris appears normal at L1-L2.  Decreased T2 signal in the visible liver and spleen. Stable visualized abdominal viscera, small retroperitoneal lymph nodes. Negative visualized posterior paraspinal soft tissues.  T10-T11: Bulky but incompletely visualized central disc extrusion suggested at T10-T11 (series 2, image 8). This disc herniation is seen to be partially calcified on the recent CT Abdomen and Pelvis comparison. Those images suggest moderate associated lower thoracic spinal stenosis.  T11-T12: Small central disc protrusion. Mild to moderate left facet hypertrophy. Moderate left T11 foraminal stenosis.  T12-L1: Right paracentral disc protrusion with no significant stenosis.  L1-L2: Left paracentral and caudal disc extrusion (series 5, image 10) is mild. Borderline to mild spinal stenosis at this level without foraminal involvement.  L2-L3:  Minimal disc bulge.  Mild facet hypertrophy.  No stenosis.  L3-L4:  Mild disc bulge.  Moderate facet hypertrophy.  No stenosis.  L4-L5: Mild circumferential disc bulge. Moderate to severe facet hypertrophy. Borderline to mild spinal stenosis (series 5, image 29). No convincing lateral recess or foraminal stenosis.  L5-S1:  Mild disc bulge and facet hypertrophy.  No stenosis.  IMPRESSION: 1. Disc degeneration maximal in the lower thoracic and upper lumbar spine. Suspect up to moderate lower thoracic spinal stenosis at T10-T11 related to a partially calcified disc herniation which is partially visible on this study. Follow-up thoracic spine MRI would evaluate further as necessary. 2. Mild multifactorial lumbar spinal stenosis at L1-L2 and  L4-L5. 3. Moderate left T11 neural foraminal stenosis. No lumbar foraminal stenosis. 4.  No acute osseous abnormality in the lumbar spine.   Electronically Signed   By: Genevie Ann M.D.   On: 02/03/2015 11:42    Lumbar MR wo contrast:  Results for orders placed in visit on 02/28/10  MR L Spine Ltd W/O Cm   Narrative   * PRIOR REPORT IMPORTED FROM AN EXTERNAL SYSTEM *   PRIOR REPORT IMPORTED FROM THE [employer] WORKFLOW SYSTEM   REASON FOR EXAM:    low back pain  COMMENTS:   PROCEDURE:     MR  - MR LUMBAR SPINE WO CONTRAST  - Feb 28 2010  5:14PM   RESULT:     MRI LUMBAR SPINE WITHOUT CONTRAST   HISTORY: Low back pain   COMPARISON: None   TECHNIQUE: Multiplanar and multisequence MRI of the lumbar spine were  obtained, without administration of IV contrast.   FINDINGS:   The vertebral bodies of the lumbar spine are normal in size and alignment.  There is normal bone marrow signal demonstrated throughout the vertebra.  The  intervertebral disc spaces are well-maintained.   The spinal cord is of normal volume and contour. The cord terminates  normally at L1 . The nerve roots of the cauda equina and the filum  terminale  have the usual appearance.   The visualized portions of the SI joints are unremarkable.   The imaged intra-abdominal contents are unremarkable.   T10-T11: There is a small central disc protrusion abutting the ventral  thoracic spinal cord.   T12-L1: Small right paracentral disc protrusion. No evidence of neural  foraminal or central stenosis.   L1-L2: Small-moderate central disc protrusion with mild mass effect upon  the  ventral thecal sac. No evidence of neural foraminal or central stenosis.   L2-L3: No significant disc bulge. No evidence of neural foraminal or  central  stenosis. Mild bilateral facet arthropathy.   L3-L4: No significant disc bulge. No evidence of neural foraminal or  central  stenosis. Moderate bilateral facet arthropathy.    L4-L5: Mild broad-based disc bulge. Severe bilateral facet arthropathy.  Mild-moderate spinal stenosis. No evidence of neural foraminal stenosis.   L5-S1: No significant disc bulge. No evidence of neural foraminal or  central  stenosis. Mild bilateral facet arthropathy.   IMPRESSION:   1. Lumbar spine spondylosis as described above.       Lumbar CT wo contrast:  Results for orders placed during the hospital encounter of 09/02/16  CT Lumbar Spine Wo Contrast   Narrative CLINICAL DATA:  Initial evaluation for chronic back pain, recent spine injection.  EXAM: CT LUMBAR SPINE WITHOUT CONTRAST  TECHNIQUE: Multidetector CT imaging of the lumbar spine was performed without intravenous contrast administration. Multiplanar CT image reconstructions were also generated.  COMPARISON:  Prior MRI from 02/03/2015.  FINDINGS: Segmentation: Normal segmentation. Lowest well-formed disc labeled the L5-S1 level.  Alignment:  Trace levoscoliosis with apex at L4-5. Mild straightening of the normal lumbar lordosis. Trace anterolisthesis of L4 on L5. No malalignment.  Vertebrae: Vertebral body heights maintained. No evidence for acute or chronic fracture. Few scattered degenerative endplate Schmorl's nodes noted, most notable about the L2-3 interspace. No discrete osseous lesions. Visualized sacrum intact. SI joints approximated and symmetric.  Paraspinal and other soft tissues: Paraspinous soft tissues demonstrate no acute abnormality. Mild atherosclerotic plaque within the infrarenal aorta. Visualized visceral structures otherwise unremarkable.  Disc levels:  T11-12: Shallow posterior osseous ridging mildly indents the ventral thecal sac. No canal or foraminal stenosis.  T12-L1: Right paracentral disc protrusion with associated spurring mildly indents the ventral thecal sac. No significant stenosis. This is stable from previous.  L1-2: Small left paracentral disc protrusion  indents the ventral thecal sac. Mild facet arthrosis. No significant canal or foraminal stenosis. This is stable from previous.  L2-3: Minimal disc bulge with mild bilateral facet arthropathy. No stenosis.  L3-4: Minimal annular disc bulge. Moderate bilateral facet arthrosis. No significant stenosis.  L4-5: Trace anterolisthesis. Minimal annular disc bulge. Moderate bilateral facet arthrosis. Resultant mild to moderate spinal stenosis, unchanged. No significant foraminal encroachment.  L5-S1:  Mild disc bulge and facet hypertrophy.  No stenosis.  Overall appearance of the degenerative changes are not significantly changed relative to previous MRI. No complication identified status post recent injection.  IMPRESSION: 1. No acute abnormality within the lumbar spine. No complication identified status post recent injection. 2. Multilevel disc degeneration with facet arthropathy as above, most notable at L4-5. Overall, appearance is not significantly changed relative to most recent MRI from 02/03/2015.   Electronically Signed   By: Jeannine Boga M.D.   On: 09/02/2016 19:51    Lumbar DG 2-3 views:  Results for orders placed in visit on 08/27/09  DG Lumbar Spine 2-3 Views   Narrative * PRIOR REPORT IMPORTED FROM AN EXTERNAL SYSTEM *   PRIOR REPORT IMPORTED FROM THE [employer] WORKFLOW SYSTEM   REASON FOR EXAM:    trauma  COMMENTS:   PROCEDURE:     DXR - DXR LUMBAR SPINE AP AND LATERAL  - Aug 27 2009   1:30PM   RESULT:     Comparison: None   Findings:   AP and lateral views of the lumbar spine and a coned down view of the  lumbosacral junction are provided.   There are 5 nonrib bearing lumbar-type vertebral bodies. The vertebral  body  heights are maintained. The alignment is anatomic. There is no  spondylolysis. There is no acute fracture or static listhesis. The disc  spaces are maintained.   The SI joints are unremarkable.   IMPRESSION:   1. No acute  osseous abnormality of the lumbar spine.        Hip Imaging:  Hip-R DG 2-3 views:  Results for orders placed during the hospital encounter of 04/21/16  DG HIP UNILAT W OR W/O PELVIS 2-3 VIEWS RIGHT   Narrative CLINICAL DATA:  Chronic bilateral hip pain for at least the past 10 years. No known injury  EXAM: DG HIP (WITH OR WITHOUT PELVIS) 2-3V RIGHT  COMPARISON:  CT scan the abdomen pelvis of June 24, 2015  FINDINGS: The right hemipelvis is subjectively adequately mineralized. No lytic or blastic bony lesion is observed. AP and lateral views of the right hip reveal preservation of the joint space. The articular surfaces of the femoral head and acetabulum remains smoothly rounded. The femoral neck, intertrochanteric, and subtrochanteric regions are normal.  IMPRESSION: There is no acute or significant chronic bony abnormality of the right hip.   Electronically Signed   By: David  Martinique M.D.   On: 04/21/2016 15:00    Hip-L DG 2-3 views:  Results for orders placed during the hospital encounter of 04/21/16  DG HIP UNILAT W OR W/O PELVIS 2-3 VIEWS LEFT   Narrative CLINICAL DATA:  Bilateral hip pain and limited range of motion for the past 10 years. No known injury.  EXAM: DG HIP (WITH OR WITHOUT PELVIS) 2-3V LEFT  COMPARISON:  Abdomen pelvis CT dated 06/24/2015.  FINDINGS: Curvilinear metallic densities in the left pelvis and lower abdomen, corresponding to the inferior epigastric artery embolization coils on the previous CT. The visualized bowel gas pattern is normal. Bilateral pelvic phleboliths. Unremarkable bones.  IMPRESSION: No acute abnormality.   Electronically Signed   By: Claudie Revering M.D.   On: 04/21/2016 15:00     Complexity Note: Imaging results reviewed. Results shared with Lori. Mckenzie, using Layman's terms.                         Meds   Current Outpatient Prescriptions:  .  albuterol (PROVENTIL HFA;VENTOLIN HFA) 108 (90 Base) MCG/ACT  inhaler, Inhale 2 puffs into the lungs every 6 (six) hours as needed for wheezing or shortness of breath., Disp: , Rfl:  .  ALPRAZolam (XANAX) 1 MG tablet, Take 1 mg by mouth 3 (three) times daily. , Disp: , Rfl:  .  cholecalciferol (VITAMIN D) 1000 units tablet, Take 2,000 Units by mouth daily., Disp: , Rfl:  .  ciprofloxacin (CIPRO) 500 MG tablet, Take 1 tablet (500 mg total) by mouth 2 (two) times daily., Disp: 10 tablet, Rfl: 0 .  citalopram (CELEXA) 20 MG tablet, Take 20-30 mg by mouth daily. , Disp: , Rfl:  .  diclofenac sodium (VOLTAREN) 1 % GEL, Apply 4 g topically 4 (four) times daily., Disp: 100 g, Rfl: 2 .  dicyclomine (BENTYL) 10 MG capsule, Take 10 mg by mouth 4 (four) times daily. , Disp: , Rfl:  .  fluticasone (FLONASE) 50 MCG/ACT nasal spray, Place 1-2 sprays into both nostrils daily. , Disp: , Rfl:  .  Folate-B12-Intrinsic Factor (INTRINSI B12-FOLATE) 854-627-03 MCG-MCG-MG TABS, Take 1 tablet by mouth daily. Reported on 04/06/2015, Disp: , Rfl:  .  furosemide (LASIX) 40 MG tablet, Take 1 tablet (40 mg total) by mouth 2 (two) times daily., Disp: 60 tablet, Rfl: 0 .  gabapentin (NEURONTIN) 300 MG capsule, Take 600 mg by mouth 4 (four) times daily. , Disp: , Rfl:  .  insulin detemir (LEVEMIR) 100 UNIT/ML injection, Inject 0.25 mLs (25 Units total) into the skin 2 (two) times daily., Disp: 100 mL, Rfl: 1 .  ipratropium-albuterol (DUONEB) 0.5-2.5 (3) MG/3ML SOLN, Take 3 mLs by nebulization every 4 (four) hours as needed. For shortness of breath/wheezing, Disp: , Rfl:  .  lactulose (CHRONULAC) 10 GM/15ML solution, Take 30 mLs (20 g total) by mouth 3 (three) times daily., Disp: 1892 mL, Rfl: 0 .  mupirocin ointment (BACTROBAN) 2 %, Place 1 application into the nose 2 (two) times daily., Disp: 22 g, Rfl: 0 .  nystatin (MYCOSTATIN/NYSTOP) powder, Apply 1 g topically 3 (three) times daily as needed. For irritation, Disp: 30 g, Rfl: 0 .  pantoprazole (PROTONIX) 40 MG tablet, Take 40 mg by mouth 2  (two) times daily before a meal., Disp: , Rfl:  .  potassium chloride SA (K-DUR,KLOR-CON) 20  MEQ tablet, Take 10-20 mEq by mouth daily as needed. For high blood pressure, Disp: , Rfl:  .  promethazine (PHENERGAN) 12.5 MG tablet, Take 2 tablets (25 mg total) by mouth every 6 (six) hours as needed for nausea or vomiting., Disp: 30 tablet, Rfl: 0 .  rifaximin (XIFAXAN) 550 MG TABS tablet, Take 550 mg by mouth 2 (two) times daily. , Disp: , Rfl:  .  rizatriptan (MAXALT) 10 MG tablet, Take 10 mg by mouth as needed for migraine. , Disp: , Rfl:  .  spironolactone (ALDACTONE) 100 MG tablet, Take 100 mg by mouth daily. , Disp: , Rfl:  .  vitamin B-12 (CYANOCOBALAMIN) 1000 MCG tablet, Take 2,000 mcg by mouth daily., Disp: , Rfl:   ROS  Constitutional: Denies any fever or chills Gastrointestinal: No reported hemesis, hematochezia, vomiting, or acute GI distress Musculoskeletal: Denies any acute onset joint swelling, redness, loss of ROM, or weakness Neurological: No reported episodes of acute onset apraxia, aphasia, dysarthria, agnosia, amnesia, paralysis, loss of coordination, or loss of consciousness  Allergies  Lori Mckenzie is allergic to tape and vicodin [hydrocodone-acetaminophen].  PFSH  Drug: Lori Mckenzie  reports that she does not use drugs. Alcohol:  reports that she does not drink alcohol. Tobacco:  reports that she has never smoked. She has never used smokeless tobacco. Medical:  has a past medical history of Abdominal abscess (08/25/2014); Acid reflux (08/10/2010); Acute cervical myofascial strain (02/09/2016); Anxiety; Ascites; Asthma; Back pain; Bile leak, postoperative (07/17/2014); Brittle bone disease; Cancer ([place]); Cervical disc disease; Chronic kidney disease; Collagen vascular disease ([place]); COPD (chronic obstructive pulmonary disease) ([place]); Diabetes mellitus without complication ([place]); GERD (gastroesophageal reflux disease); Hypertension; Hypothyroidism; Left upper quadrant pain (01/09/2014); Major  depressive disorder with single episode (12/05/2011); Major depressive disorder, single episode (12/05/2011); Migraines; NASH (nonalcoholic steatohepatitis); Respiratory infection; Shock ([place]) (09/18/2014); Sleep apnea; Sleep apnea; Syncope (11/16/2014); Thyroid disease; and TIA (transient ischemic attack). Surgical: Lori Mckenzie  has a past surgical history that includes Abdominal hysterectomy; Tubal ligation; Colonoscopy with propofol (N/A, 06/23/2014); Esophagogastroduodenoscopy (N/A, 06/23/2014); Cholecystectomy (N/A, 07/15/2014); ERCP (N/A, 07/16/2014); Wisdom tooth extraction; ERCP (N/A, 10/03/2014); Esophagogastroduodenoscopy (egd) with propofol (N/A, 12/01/2015); and Esophagogastroduodenoscopy (N/A, 06/30/2016). Family: family history includes Emphysema in her father; Heart disease in her brother and sister; Lung cancer in her mother; Ulcers in her father and sister.  Constitutional Exam  General appearance: alert, cooperative, slowed mentation, in mild distress and moderately obese Vitals:   10/27/16 1157  BP: 126/87  Pulse: (!) 104  Resp: 18  Temp: 97.7 F (36.5 C)  TempSrc: Oral  SpO2: 99%  Weight: 270 lb (122.5 kg)  Height: 5' 2"  (1.575 m)  Psych/Mental status: Alert, oriented x 3 (person, place, & time)       Eyes: PERLA Respiratory: No evidence of acute respiratory distress  Cervical Spine Area Exam  Skin & Axial Inspection: No masses, redness, edema, swelling, or associated skin lesions Alignment: Symmetrical Functional ROM: Unrestricted ROM      Stability: No instability detected Muscle Tone/Strength: Functionally intact. No obvious neuro-muscular anomalies detected. Sensory (Neurological): Unimpaired Palpation: No palpable anomalies              Upper Extremity (UE) Exam    Side: Right upper extremity  Side: Left upper extremity  Skin & Extremity Inspection: Skin color, temperature, and hair growth are WNL. No peripheral edema or cyanosis. No masses, redness, swelling, asymmetry,  or associated skin lesions. No contractures.  Skin & Extremity Inspection: Skin color, temperature, and hair  growth are WNL. No peripheral edema or cyanosis. No masses, redness, swelling, asymmetry, or associated skin lesions. No contractures.  Functional ROM: Unrestricted ROM          Functional ROM: Unrestricted ROM          Muscle Tone/Strength: Functionally intact. No obvious neuro-muscular anomalies detected.  Muscle Tone/Strength: Functionally intact. No obvious neuro-muscular anomalies detected.  Sensory (Neurological): Unimpaired          Sensory (Neurological): Unimpaired          Palpation: No palpable anomalies              Palpation: No palpable anomalies              Specialized Test(s): Deferred         Specialized Test(s): Deferred          Thoracic Spine Area Exam  Skin & Axial Inspection: No masses, redness, or swelling Alignment: Symmetrical Functional ROM: Unrestricted ROM Stability: No instability detected Muscle Tone/Strength: Functionally intact. No obvious neuro-muscular anomalies detected. Sensory (Neurological): Unimpaired Muscle strength & Tone: No palpable anomalies  Lumbar Spine Area Exam  Skin & Axial Inspection: No masses, redness, or swelling Alignment: Symmetrical Functional ROM: Unrestricted ROM      Stability: No instability detected Muscle Tone/Strength: Functionally intact. No obvious neuro-muscular anomalies detected. Sensory (Neurological): Unimpaired Palpation: No palpable anomalies       Provocative Tests: Lumbar Hyperextension and rotation test: evaluation deferred today       Lumbar Lateral bending test: evaluation deferred today       Patrick's Maneuver: evaluation deferred today                    Gait & Posture Assessment  Ambulation: Unassisted Gait: Relatively normal for age and body habitus Posture: WNL   Lower Extremity Exam    Side: Right lower extremity  Side: Left lower extremity  Skin & Extremity Inspection: Skin color,  temperature, and hair growth are WNL. No peripheral edema or cyanosis. No masses, redness, swelling, asymmetry, or associated skin lesions. No contractures.  Skin & Extremity Inspection: Skin color, temperature, and hair growth are WNL. No peripheral edema or cyanosis. No masses, redness, swelling, asymmetry, or associated skin lesions. No contractures.  Functional ROM: Unrestricted ROM          Functional ROM: Unrestricted ROM          Muscle Tone/Strength: Functionally intact. No obvious neuro-muscular anomalies detected.  Muscle Tone/Strength: Functionally intact. No obvious neuro-muscular anomalies detected.  Sensory (Neurological): Unimpaired  Sensory (Neurological): Unimpaired  Palpation: No palpable anomalies  Palpation: No palpable anomalies   Assessment  Primary Diagnosis & Pertinent Problem List: The primary encounter diagnosis was Chronic low back pain (Primary Source of Pain) (Bilateral) (R>L). Diagnoses of Chronic neck pain (Tertiary source of pain) (Bilateral) (R>L), Chronic pain syndrome, and Chronic sacroiliac joint pain (Bilateral) (L>R) were also pertinent to this visit.  Status Diagnosis  Controlled Controlled Controlled 1. Chronic low back pain (Primary Source of Pain) (Bilateral) (R>L)   2. Chronic neck pain (Tertiary source of pain) (Bilateral) (R>L)   3. Chronic pain syndrome   4. Chronic sacroiliac joint pain (Bilateral) (L>R)     Problems updated and reviewed during this visit: No problems updated. Plan of Care  Pharmacotherapy (Medications Ordered): No orders of the defined types were placed in this encounter.  New Prescriptions   No medications on file   Medications administered today: Lori Mckenzie had no medications  administered during this visit. Lab-work, procedure(s), and/or referral(s): No orders of the defined types were placed in this encounter.  Imaging and/or referral(s): None  Interventional management options: Planned, scheduled, and/or pending:    Not at this time. Mr Lori Mckenzie has been informed that she is no longer a candidate for narcotics. We will not be writing any additional narcotics in this office. She can continue to have interventional therapy as needed   Considering:   Diagnostic bilateral cervical facetblock Diagnostic L1-2 lumbar epidural steroid injection Diagnostic, right-sided L4-5 lumbar epiduralsteroid injection Diagnostic bilateral intra-articular shoulderjoint injection Diagnostic bilateral suprascapular nerveblock Possible bilateral suprascapular nerve radiofrequencyablation Palliative left-sided L5-S1 lumbar epiduralsteroid injection  Diagnostic left S1 selective nerveroot block Diagnostic right-sided cervical epiduralsteroid injection Diagnostic T10-11 thoracic epiduralsteroid injection Diagnostic bilateral intra-articular hipjoint injection Possible bilateral hip radiofrequencyablation   Palliative PRN treatment(s):   Diagnostic L1-2 lumbar epidural steroidinjection Diagnostic, right-sided L4-5 lumbar epidural steroidinjection Diagnostic T10-11 thoracic epidural steroidinjection    Provider-requested follow-up: Return for PRN Procedure.  No future appointments. Primary Care Physician: Ardine Eng, MD Location: [facility] [place] [facility] Note by: Vevelyn Francois NP Date: 10/27/2016; Time: 10:14 AM  Pain Score Disclaimer: We use the NRS-11 scale. This is a self-reported, subjective measurement of pain severity with only modest accuracy. It is used primarily to identify changes within a particular patient. It must be understood that outpatient pain scales are significantly less accurate that those used for research, where they can be applied under ideal controlled circumstances with minimal exposure to variables. In reality, the score is likely to be a combination of pain intensity and pain affect, where pain affect describes the degree of emotional arousal or changes in action  readiness caused by the sensory experience of pain. Factors such as social and work situation, setting, emotional state, anxiety levels, expectation, and prior pain experience may influence pain perception and show large inter-individual differences that may also be affected by time variables.  Patient instructions provided during this appointment: Patient Instructions    BMI Assessment: Estimated body mass index is 49.38 kg/m as calculated from the following:   Height as of this encounter: _0  (1.575 m).   Weight as of this encounter: 270 lb (122.5 kg).  BMI interpretation table: BMI level Category Range association with higher incidence of chronic pain  <18 kg/m2 Underweight   18.5-24.9 kg/m2 Ideal body weight   25-29.9 kg/m2 Overweight Increased incidence by 20%  30-34.9 kg/m2 Obese (Class I) Increased incidence by 68%  35-39.9 kg/m2 Severe obesity (Class II) Increased incidence by 136%  >40 kg/m2 Extreme obesity (Class III) Increased incidence by 254%   BMI Readings from Last 4 Encounters:  10/27/16 49.38 kg/m  10/22/16 49.03 kg/m  10/02/16 49.02 kg/m  09/28/16 49.02 kg/m   Wt Readings from Last 4 Encounters:  10/27/16 270 lb (122.5 kg)  10/22/16 268 lb 1.3 oz (121.6 kg)  10/02/16 268 lb (121.6 kg)  09/28/16 268 lb (121.6 kg)

## 2016-11-06 IMAGING — CR DG CHEST 2V
2 series · 2 of 2 positions shown · non-contrast
Comparison: 05/27/2015

CLINICAL DATA: Chest pain.

EXAM:
CHEST  2 VIEW

[chest pa]
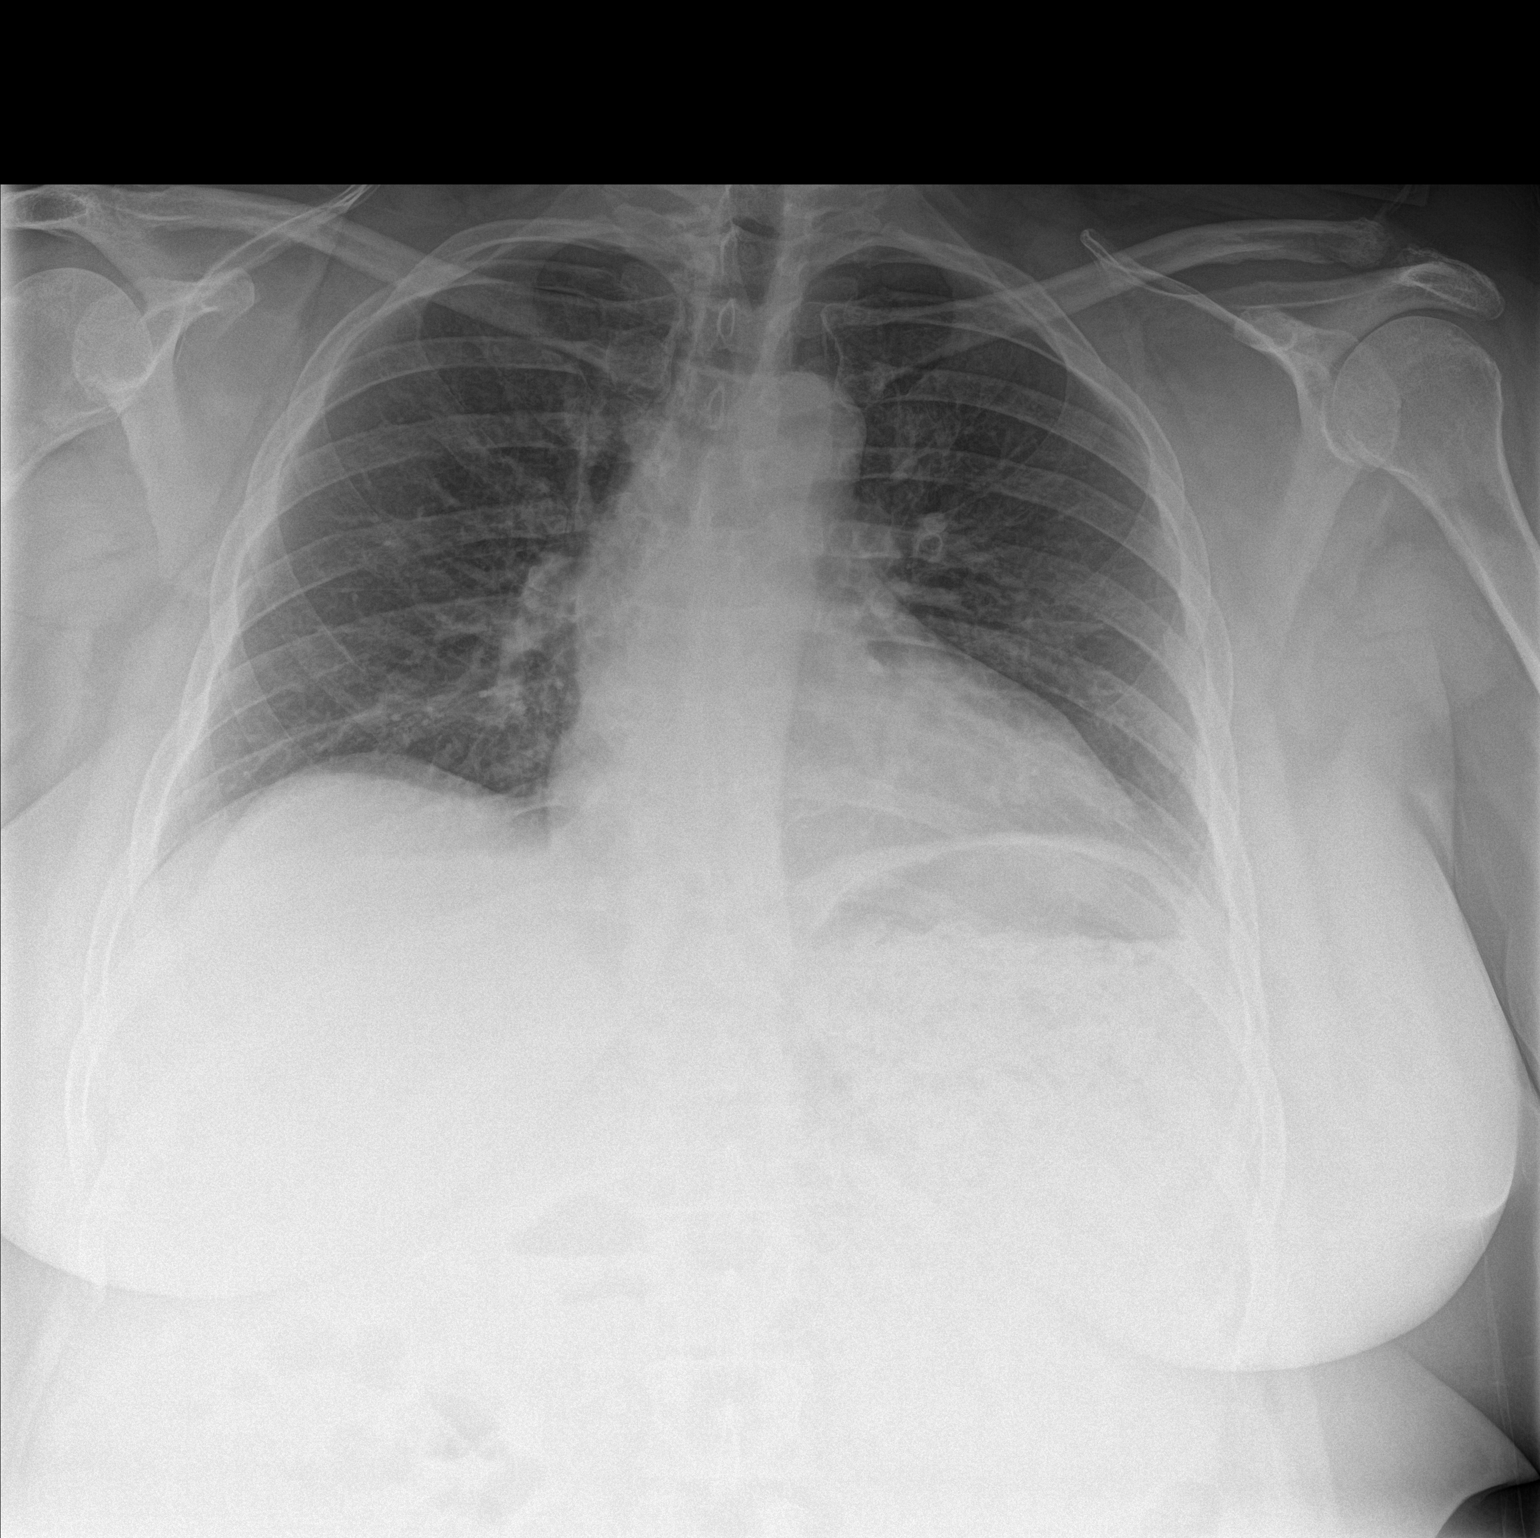

[chest lat]
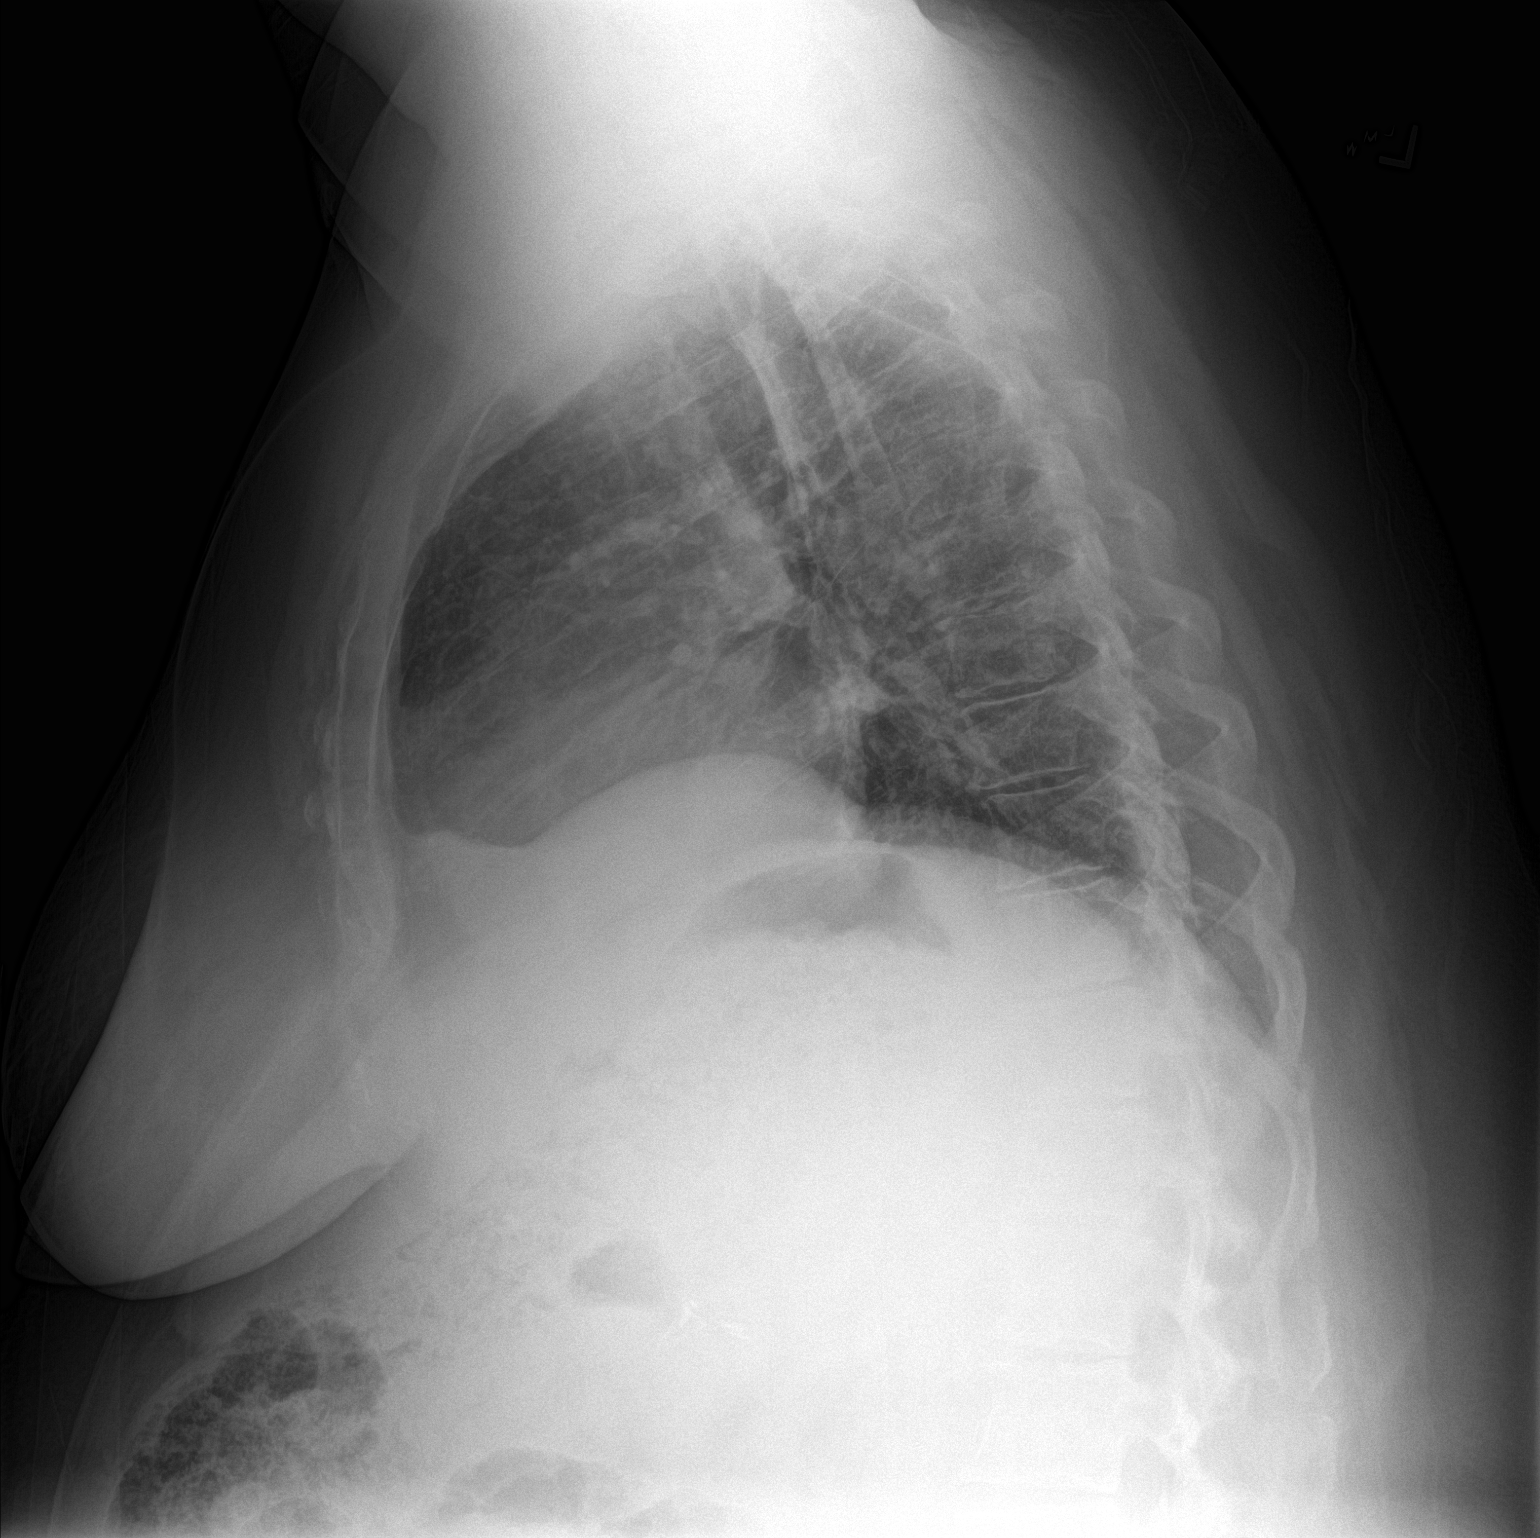

[2 of 2 positions shown; findings below may reference images not displayed]

FINDINGS: Low lung volumes. Heart and mediastinal contours are within normal
limits. No focal opacities or effusions. No acute bony abnormality.
IMPRESSION: Low volumes.  No active disease.

## 2016-11-06 IMAGING — CT CT ABD-PELV W/ CM
2 of 5 series · 16 of 46 positions shown, 18 images · IV contrast (iopamidol)
Comparison: 03/11/2015

CLINICAL DATA: Pt with GERD and reports fatigue symptoms onset
tonight, also states she is constipated. Pt also adds that her neck
and jaw having been hurting. Hx: uterine cancer with hysterectomy 28
years ago, tubal ligation, cholecystectomy,Gerd, copd

EXAM:
CT ABDOMEN AND PELVIS WITH CONTRAST
TECHNIQUE: Multidetector CT imaging of the abdomen and pelvis was performed
using the standard protocol following bolus administration of
intravenous contrast.
CONTRAST:  100mL [id number] IOPAMIDOL ([id number]) INJECTION 61%

[Series 2: routine abd pel with · axial · 0.91mm/px · z∈[-802,-362]mm · 13 of 100 slices shown, 15 images]
[im 6/100  soft-tissue]
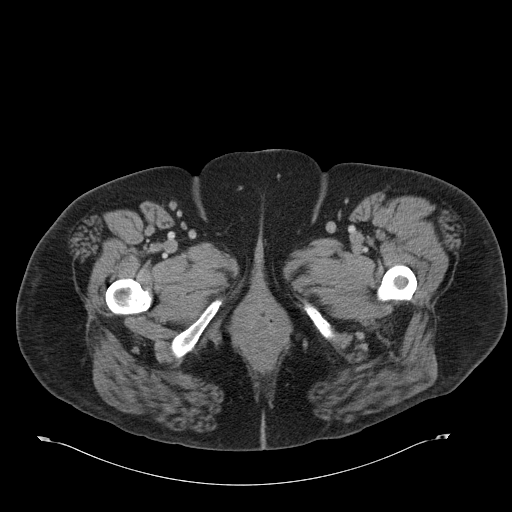
[im 6/100  bone]
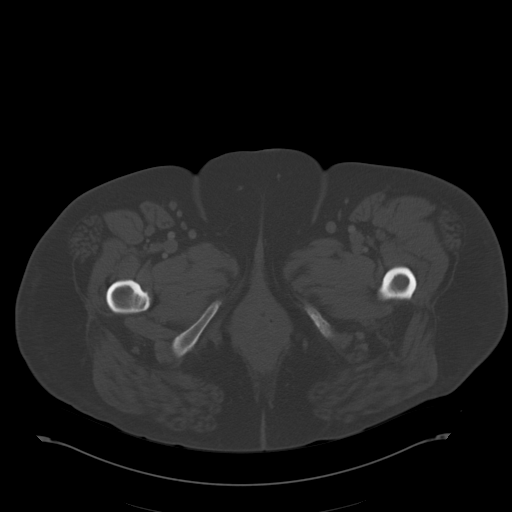
[im 16/100  soft-tissue]
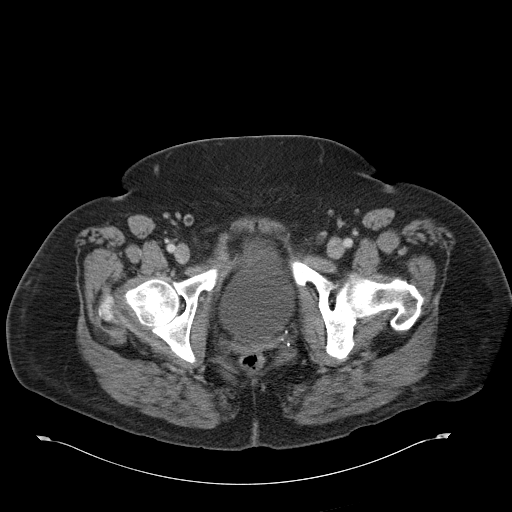
[im 21/100  soft-tissue]
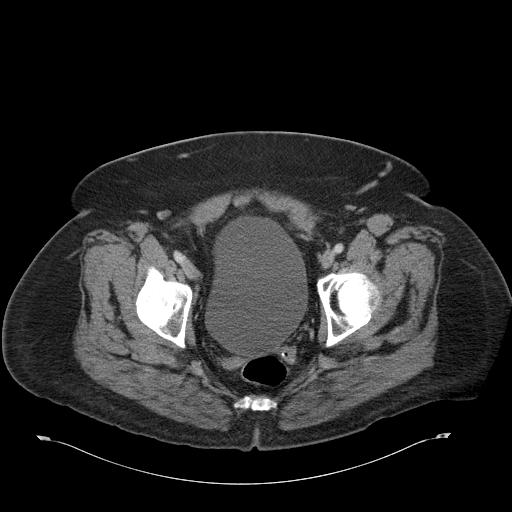
[im 27/100  soft-tissue]
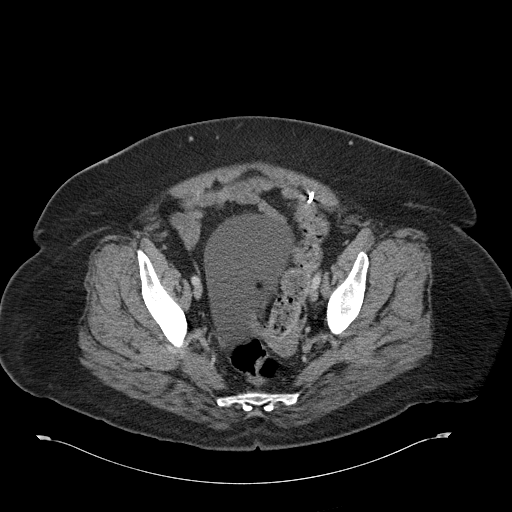
[im 37/100  soft-tissue]
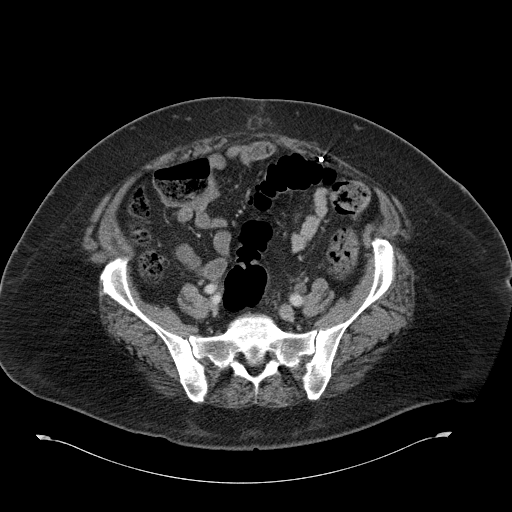
[im 42/100  soft-tissue]
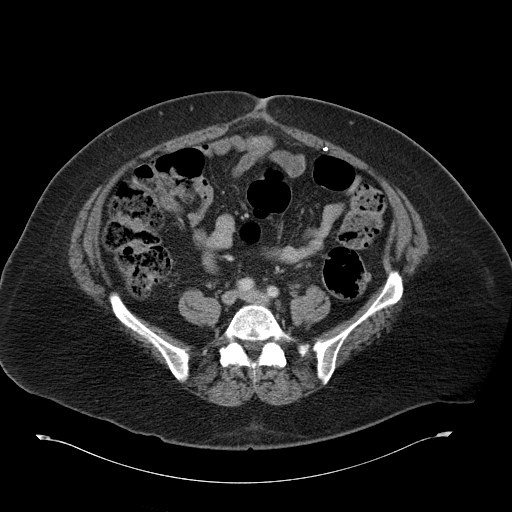
[im 53/100  soft-tissue]
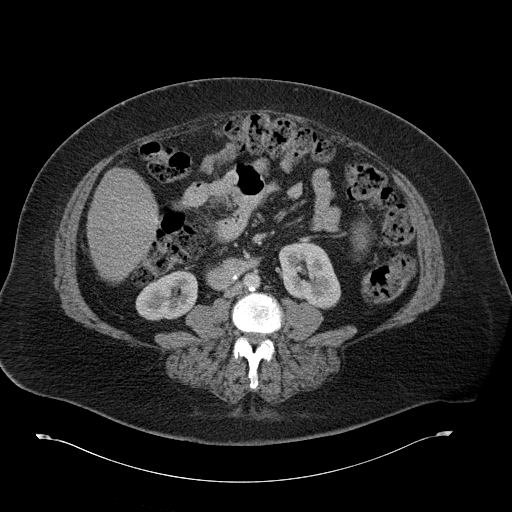
[im 58/100  soft-tissue]
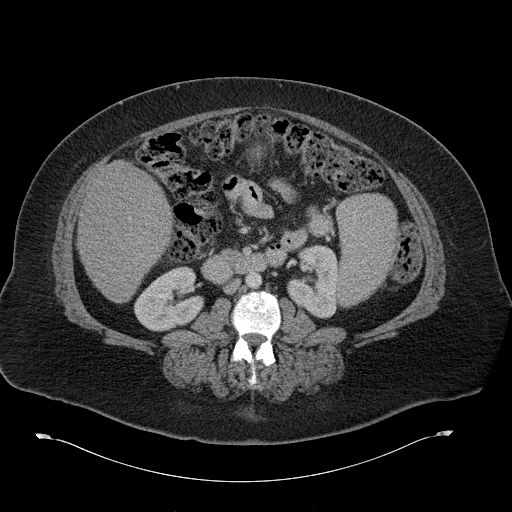
[im 63/100  soft-tissue]
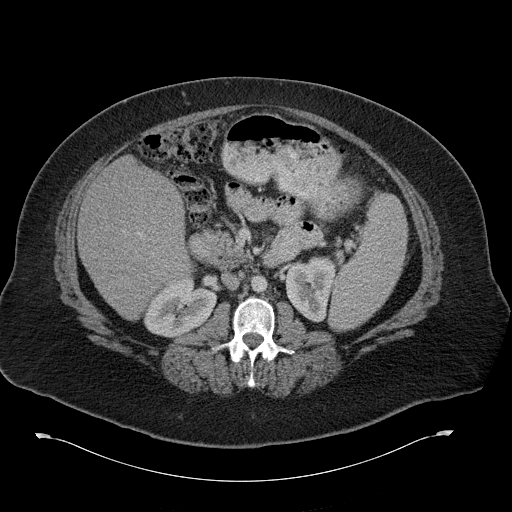
[im 63/100  bone]
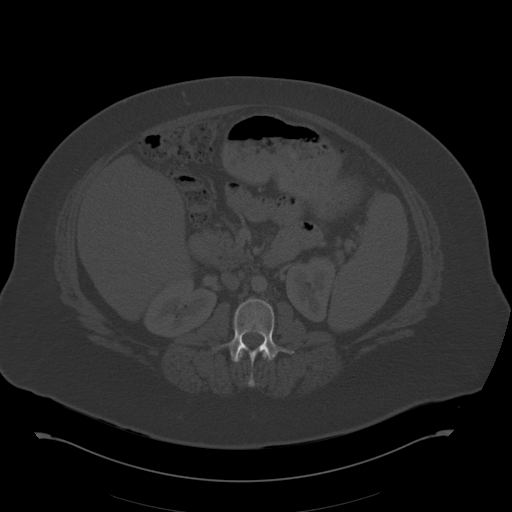
[im 73/100  soft-tissue]
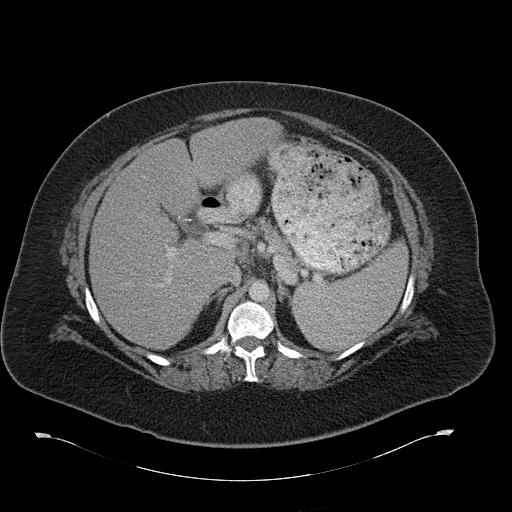
[im 79/100  soft-tissue]
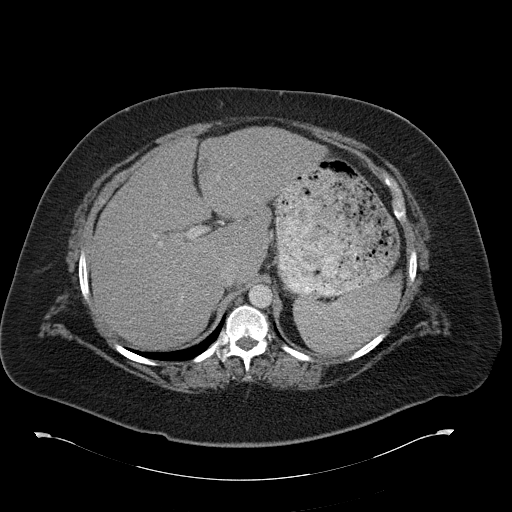
[im 84/100  soft-tissue]
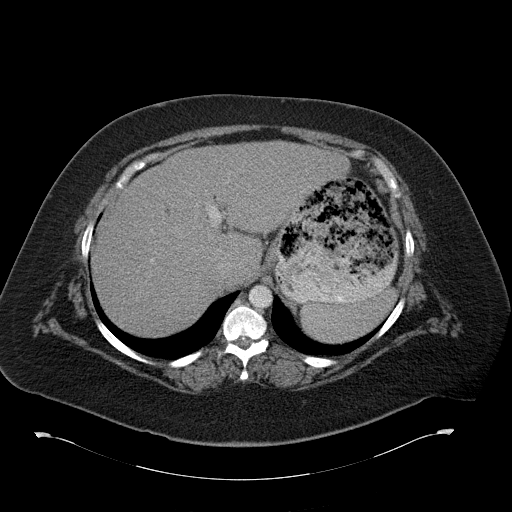
[im 94/100  soft-tissue]
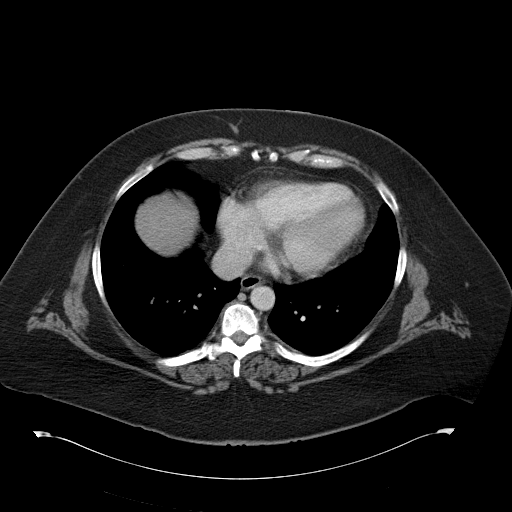

[Series 5: cor routine abd pel with · coronal · 0.83mm/px · 3 of 161 slices shown]
[im 54/161  soft-tissue]
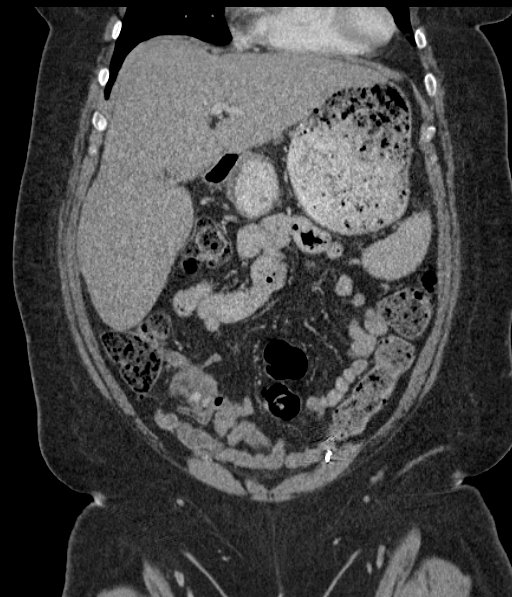
[im 72/161  soft-tissue]
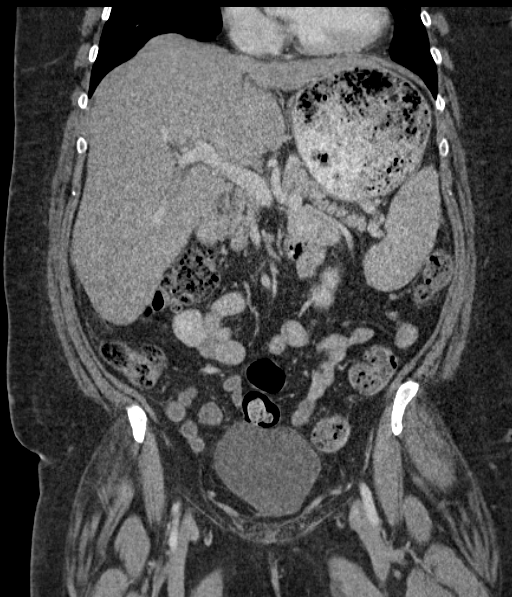
[im 89/161  soft-tissue]
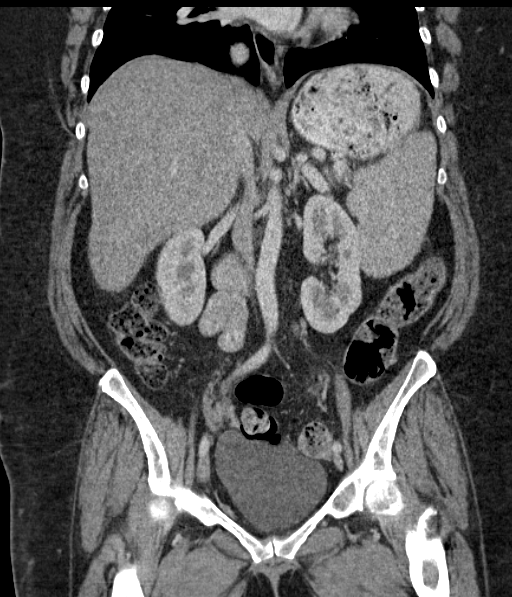

[16 of 46 positions shown; findings below may reference images not displayed]

FINDINGS: Lower chest:  No acute findings.

Hepatobiliary: Stable probable cysts in its hepatic segments 2 and
3. No new liver lesion. Cholecystectomy clips. No biliary ductal
dilatation. Gas in biliary tree implying patent sphincterotomy.

Pancreas: No mass, inflammatory changes, or other significant
abnormality.

Spleen: Within normal limits in size and appearance.

Adrenals/Urinary Tract: No masses identified. No evidence of
hydronephrosis.

Stomach/Bowel: Stomach is distended by ingested material. Small
bowel and colon are nondilated. Normal appendix.

Vascular/Lymphatic: Portal vein patent. Scattered aortic
calcifications without aneurysm. Embolization coils in the left
inferior epigastric artery. No adenopathy localized. Bilateral
pelvic phleboliths.

Reproductive: No mass or other significant abnormality. Previous
hysterectomy.

Other: No ascites.  No free air.

Musculoskeletal: Spondylitic changes in the lower cervical spine.
Negative for fracture.
IMPRESSION: 1. No acute abdominal process.
2. Stable postop changes.

## 2016-11-16 ENCOUNTER — Telehealth: Payer: Self-pay | Admitting: Pain Medicine

## 2016-11-16 NOTE — Telephone Encounter (Signed)
error 

## 2016-11-21 ENCOUNTER — Encounter: Payer: Self-pay | Admitting: Nurse Practitioner

## 2016-11-21 ENCOUNTER — Ambulatory Visit: Payer: Medicaid Other | Attending: Nurse Practitioner | Admitting: Nurse Practitioner

## 2016-11-21 VITALS — BP 117/53 | HR 95 | Temp 98.2°F | Resp 16 | Ht 62.0 in | Wt 270.0 lb

## 2016-11-21 DIAGNOSIS — E538 Deficiency of other specified B group vitamins: Secondary | ICD-10-CM | POA: Insufficient documentation

## 2016-11-21 DIAGNOSIS — E039 Hypothyroidism, unspecified: Secondary | ICD-10-CM | POA: Insufficient documentation

## 2016-11-21 DIAGNOSIS — R188 Other ascites: Secondary | ICD-10-CM | POA: Diagnosis not present

## 2016-11-21 DIAGNOSIS — M47816 Spondylosis without myelopathy or radiculopathy, lumbar region: Secondary | ICD-10-CM

## 2016-11-21 DIAGNOSIS — I129 Hypertensive chronic kidney disease with stage 1 through stage 4 chronic kidney disease, or unspecified chronic kidney disease: Secondary | ICD-10-CM | POA: Diagnosis not present

## 2016-11-21 DIAGNOSIS — M5116 Intervertebral disc disorders with radiculopathy, lumbar region: Secondary | ICD-10-CM | POA: Insufficient documentation

## 2016-11-21 DIAGNOSIS — M5442 Lumbago with sciatica, left side: Secondary | ICD-10-CM | POA: Diagnosis not present

## 2016-11-21 DIAGNOSIS — K7460 Unspecified cirrhosis of liver: Secondary | ICD-10-CM | POA: Insufficient documentation

## 2016-11-21 DIAGNOSIS — R4182 Altered mental status, unspecified: Secondary | ICD-10-CM | POA: Diagnosis not present

## 2016-11-21 DIAGNOSIS — E1165 Type 2 diabetes mellitus with hyperglycemia: Secondary | ICD-10-CM | POA: Insufficient documentation

## 2016-11-21 DIAGNOSIS — M47812 Spondylosis without myelopathy or radiculopathy, cervical region: Secondary | ICD-10-CM | POA: Diagnosis not present

## 2016-11-21 DIAGNOSIS — J441 Chronic obstructive pulmonary disease with (acute) exacerbation: Secondary | ICD-10-CM | POA: Diagnosis not present

## 2016-11-21 DIAGNOSIS — G8918 Other acute postprocedural pain: Secondary | ICD-10-CM | POA: Insufficient documentation

## 2016-11-21 DIAGNOSIS — M150 Primary generalized (osteo)arthritis: Secondary | ICD-10-CM | POA: Diagnosis not present

## 2016-11-21 DIAGNOSIS — Z9049 Acquired absence of other specified parts of digestive tract: Secondary | ICD-10-CM | POA: Insufficient documentation

## 2016-11-21 DIAGNOSIS — Z794 Long term (current) use of insulin: Secondary | ICD-10-CM | POA: Diagnosis not present

## 2016-11-21 DIAGNOSIS — R55 Syncope and collapse: Secondary | ICD-10-CM | POA: Insufficient documentation

## 2016-11-21 DIAGNOSIS — R7982 Elevated C-reactive protein (CRP): Secondary | ICD-10-CM | POA: Insufficient documentation

## 2016-11-21 DIAGNOSIS — M4804 Spinal stenosis, thoracic region: Secondary | ICD-10-CM | POA: Insufficient documentation

## 2016-11-21 DIAGNOSIS — Z79899 Other long term (current) drug therapy: Secondary | ICD-10-CM | POA: Insufficient documentation

## 2016-11-21 DIAGNOSIS — G894 Chronic pain syndrome: Secondary | ICD-10-CM | POA: Insufficient documentation

## 2016-11-21 DIAGNOSIS — X58XXXA Exposure to other specified factors, initial encounter: Secondary | ICD-10-CM | POA: Insufficient documentation

## 2016-11-21 DIAGNOSIS — G8929 Other chronic pain: Secondary | ICD-10-CM

## 2016-11-21 DIAGNOSIS — Z8673 Personal history of transient ischemic attack (TIA), and cerebral infarction without residual deficits: Secondary | ICD-10-CM | POA: Insufficient documentation

## 2016-11-21 DIAGNOSIS — E559 Vitamin D deficiency, unspecified: Secondary | ICD-10-CM | POA: Insufficient documentation

## 2016-11-21 DIAGNOSIS — M79604 Pain in right leg: Secondary | ICD-10-CM

## 2016-11-21 DIAGNOSIS — M5441 Lumbago with sciatica, right side: Secondary | ICD-10-CM | POA: Diagnosis not present

## 2016-11-21 DIAGNOSIS — K5900 Constipation, unspecified: Secondary | ICD-10-CM | POA: Insufficient documentation

## 2016-11-21 DIAGNOSIS — Z8379 Family history of other diseases of the digestive system: Secondary | ICD-10-CM | POA: Insufficient documentation

## 2016-11-21 DIAGNOSIS — M79605 Pain in left leg: Secondary | ICD-10-CM

## 2016-11-21 DIAGNOSIS — Z825 Family history of asthma and other chronic lower respiratory diseases: Secondary | ICD-10-CM | POA: Insufficient documentation

## 2016-11-21 DIAGNOSIS — K7290 Hepatic failure, unspecified without coma: Secondary | ICD-10-CM | POA: Diagnosis not present

## 2016-11-21 DIAGNOSIS — G5603 Carpal tunnel syndrome, bilateral upper limbs: Secondary | ICD-10-CM | POA: Diagnosis not present

## 2016-11-21 DIAGNOSIS — M159 Polyosteoarthritis, unspecified: Secondary | ICD-10-CM

## 2016-11-21 DIAGNOSIS — K219 Gastro-esophageal reflux disease without esophagitis: Secondary | ICD-10-CM | POA: Diagnosis not present

## 2016-11-21 DIAGNOSIS — F419 Anxiety disorder, unspecified: Secondary | ICD-10-CM | POA: Insufficient documentation

## 2016-11-21 DIAGNOSIS — M8949 Other hypertrophic osteoarthropathy, multiple sites: Secondary | ICD-10-CM

## 2016-11-21 DIAGNOSIS — F329 Major depressive disorder, single episode, unspecified: Secondary | ICD-10-CM | POA: Insufficient documentation

## 2016-11-21 DIAGNOSIS — M48061 Spinal stenosis, lumbar region without neurogenic claudication: Secondary | ICD-10-CM | POA: Insufficient documentation

## 2016-11-21 DIAGNOSIS — Z8249 Family history of ischemic heart disease and other diseases of the circulatory system: Secondary | ICD-10-CM | POA: Insufficient documentation

## 2016-11-21 DIAGNOSIS — M818 Other osteoporosis without current pathological fracture: Secondary | ICD-10-CM | POA: Diagnosis not present

## 2016-11-21 DIAGNOSIS — L02211 Cutaneous abscess of abdominal wall: Secondary | ICD-10-CM | POA: Insufficient documentation

## 2016-11-21 DIAGNOSIS — Z90710 Acquired absence of both cervix and uterus: Secondary | ICD-10-CM | POA: Insufficient documentation

## 2016-11-21 DIAGNOSIS — M7918 Myalgia, other site: Secondary | ICD-10-CM | POA: Diagnosis not present

## 2016-11-21 DIAGNOSIS — S161XXA Strain of muscle, fascia and tendon at neck level, initial encounter: Secondary | ICD-10-CM | POA: Insufficient documentation

## 2016-11-21 DIAGNOSIS — Z801 Family history of malignant neoplasm of trachea, bronchus and lung: Secondary | ICD-10-CM | POA: Insufficient documentation

## 2016-11-21 DIAGNOSIS — Z91048 Other nonmedicinal substance allergy status: Secondary | ICD-10-CM | POA: Insufficient documentation

## 2016-11-21 DIAGNOSIS — Z885 Allergy status to narcotic agent status: Secondary | ICD-10-CM | POA: Insufficient documentation

## 2016-11-21 DIAGNOSIS — Z9889 Other specified postprocedural states: Secondary | ICD-10-CM | POA: Insufficient documentation

## 2016-11-21 MED ORDER — DICLOFENAC SODIUM 1 % TD GEL: 4.0000 g | Freq: Four times a day (QID) | TRANSDERMAL | 2 refills | Status: DC

## 2016-11-21 NOTE — Progress Notes (Signed)
Safety precautions to be maintained throughout the outpatient stay will include: orient to surroundings, keep bed in low position, maintain call bell within reach at all times, provide assistance with transfer out of bed and ambulation.  

## 2016-11-21 NOTE — Patient Instructions (Addendum)
____________________________________________________________________________________________  Pain Scale  Introduction: The pain score used by this practice is the Verbal Numerical Rating Scale (VNRS-11). This is an 11-point scale. It is for adults and children 10 years or older. There are significant differences in how the pain score is reported, used, and applied. Forget everything you learned in the past and learn this scoring system.  General Information: The scale should reflect your current level of pain. Unless you are specifically asked for the level of your worst pain, or your average pain. If you are asked for one of these two, then it should be understood that it is over the past 24 hours.  Basic Activities of Daily Living (ADL): Personal hygiene, dressing, eating, transferring, and using restroom.  Instructions: Most patients tend to report their level of pain as a combination of two factors, their physical pain and their psychosocial pain. This last one is also known as "suffering" and it is reflection of how physical pain affects you socially and psychologically. From now on, report them separately. From this point on, when asked to report your pain level, report only your physical pain. Use the following table for reference.  Pain Clinic Pain Levels (0-5/10)  Pain Level Score  Description  No Pain 0   Mild pain 1 Nagging, annoying, but does not interfere with basic activities of daily living (ADL). Patients are able to eat, bathe, get dressed, toileting (being able to get on and off the toilet and perform personal hygiene functions), transfer (move in and out of bed or a chair without assistance), and maintain continence (able to control bladder and bowel functions). Blood pressure and heart rate are unaffected. A normal heart rate for a healthy adult ranges from 60 to 100 bpm (beats per minute).   Mild to moderate pain 2 Noticeable and distracting. Impossible to hide from other  people. More frequent flare-ups. Still possible to adapt and function close to normal. It can be very annoying and may have occasional stronger flare-ups. With discipline, patients may get used to it and adapt.   Moderate pain 3 Interferes significantly with activities of daily living (ADL). It becomes difficult to feed, bathe, get dressed, get on and off the toilet or to perform personal hygiene functions. Difficult to get in and out of bed or a chair without assistance. Very distracting. With effort, it can be ignored when deeply involved in activities.   Moderately severe pain 4 Impossible to ignore for more than a few minutes. With effort, patients may still be able to manage work or participate in some social activities. Very difficult to concentrate. Signs of autonomic nervous system discharge are evident: dilated pupils (mydriasis); mild sweating (diaphoresis); sleep interference. Heart rate becomes elevated (>115 bpm). Diastolic blood pressure (lower number) rises above 100 mmHg. Patients find relief in laying down and not moving.   Severe pain 5 Intense and extremely unpleasant. Associated with frowning face and frequent crying. Pain overwhelms the senses.  Ability to do any activity or maintain social relationships becomes significantly limited. Conversation becomes difficult. Pacing back and forth is common, as getting into a comfortable position is nearly impossible. Pain wakes you up from deep sleep. Physical signs will be obvious: pupillary dilation; increased sweating; goosebumps; brisk reflexes; cold, clammy hands and feet; nausea, vomiting or dry heaves; loss of appetite; significant sleep disturbance with inability to fall asleep or to remain asleep. When persistent, significant weight loss is observed due to the complete loss of appetite and sleep deprivation.  Blood  pressure and heart rate becomes significantly elevated. Caution: If elevated blood pressure triggers a pounding headache  associated with blurred vision, then the patient should immediately seek attention at an urgent or emergency care unit, as these may be signs of an impending stroke.    Emergency Department Pain Levels (6-10/10)  Emergency Room Pain 6 Severely limiting. Requires emergency care and should not be seen or managed at an outpatient pain management facility. Communication becomes difficult and requires great effort. Assistance to reach the emergency department may be required. Facial flushing and profuse sweating along with potentially dangerous increases in heart rate and blood pressure will be evident.   Distressing pain 7 Self-care is very difficult. Assistance is required to transport, or use restroom. Assistance to reach the emergency department will be required. Tasks requiring coordination, such as bathing and getting dressed become very difficult.   Disabling pain 8 Self-care is no longer possible. At this level, pain is disabling. The individual is unable to do even the most "basic" activities such as walking, eating, bathing, dressing, transferring to a bed, or toileting. Fine motor skills are lost. It is difficult to think clearly.   Incapacitating pain 9 Pain becomes incapacitating. Thought processing is no longer possible. Difficult to remember your own name. Control of movement and coordination are lost.   The worst pain imaginable 10 At this level, most patients pass out from pain. When this level is reached, collapse of the autonomic nervous system occurs, leading to a sudden drop in blood pressure and heart rate. This in turn results in a temporary and dramatic drop in blood flow to the brain, leading to a loss of consciousness. Fainting is one of the body's self defense mechanisms. Passing out puts the brain in a calmed state and causes it to shut down for a while, in order to begin the healing process.    Summary: 1. Refer to this scale when providing [place] with your pain level. 2. Be  accurate and careful when reporting your pain level. This will help with your care. 3. Over-reporting your pain level will lead to loss of credibility. 4. Even a level of 1/10 means that there is pain and will be treated at our facility. 5. High, inaccurate reporting will be documented as "Symptom Exaggeration", leading to loss of credibility and suspicions of possible secondary gains such as obtaining more narcotics, or wanting to appear disabled, for fraudulent reasons. 6. Only pain levels of 5 or below will be seen at our facility. 7. Pain levels of 6 and above will be sent to the Emergency Department and the appointment cancelled. ____________________________________________________________________________________________   GENERAL RISKS AND COMPLICATIONS  What are the risk, side effects and possible complications? Generally speaking, most procedures are safe.  However, with any procedure there are risks, side effects, and the possibility of complications.  The risks and complications are dependent upon the sites that are lesioned, or the type of nerve block to be performed.  The closer the procedure is to the spine, the more serious the risks are.  Great care is taken when placing the radio frequency needles, block needles or lesioning probes, but sometimes complications can occur. 1. Infection: Any time there is an injection through the skin, there is a risk of infection.  This is why sterile conditions are used for these blocks.  There are four possible types of infection. 1. Localized skin infection. 2. Central Nervous System Infection-This can be in the form of Meningitis, which can be deadly.  3. Epidural Infections-This can be in the form of an epidural abscess, which can cause pressure inside of the spine, causing compression of the spinal cord with subsequent paralysis. This would require an emergency surgery to decompress, and there are no guarantees that the patient would recover from  the paralysis. 4. Discitis-This is an infection of the intervertebral discs.  It occurs in about 1% of discography procedures.  It is difficult to treat and it may lead to surgery.        2. Pain: the needles have to go through skin and soft tissues, will cause soreness.       3. Damage to internal structures:  The nerves to be lesioned may be near blood vessels or    other nerves which can be potentially damaged.       4. Bleeding: Bleeding is more common if the patient is taking blood thinners such as  aspirin, Coumadin, Ticiid, Plavix, etc., or if he/she have some genetic predisposition  such as hemophilia. Bleeding into the spinal canal can cause compression of the spinal  cord with subsequent paralysis.  This would require an emergency surgery to  decompress and there are no guarantees that the patient would recover from the  paralysis.       5. Pneumothorax:  Puncturing of a lung is a possibility, every time a needle is introduced in  the area of the chest or upper back.  Pneumothorax refers to free air around the  collapsed lung(s), inside of the thoracic cavity (chest cavity).  Another two possible  complications related to a similar event would include: Hemothorax and Chylothorax.   These are variations of the Pneumothorax, where instead of air around the collapsed  lung(s), you may have blood or chyle, respectively.       6. Spinal headaches: They may occur with any procedures in the area of the spine.       7. Persistent CSF (Cerebro-Spinal Fluid) leakage: This is a rare problem, but may occur  with prolonged intrathecal or epidural catheters either due to the formation of a fistulous  track or a dural tear.       8. Nerve damage: By working so close to the spinal cord, there is always a possibility of  nerve damage, which could be as serious as a permanent spinal cord injury with  paralysis.       9. Death:  Although rare, severe deadly allergic reactions known as "Anaphylactic  reaction" can  occur to any of the medications used.      10. Worsening of the symptoms:  We can always make thing worse.  What are the chances of something like this happening? Chances of any of this occuring are extremely low.  By statistics, you have more of a chance of getting killed in a motor vehicle accident: while driving to the hospital than any of the above occurring .  Nevertheless, you should be aware that they are possibilities.  In general, it is similar to taking a shower.  Everybody knows that you can slip, hit your head and get killed.  Does that mean that you should not shower again?  Nevertheless always keep in mind that statistics do not mean anything if you happen to be on the wrong side of them.  Even if a procedure has a 1 (one) in a 1,000,000 (million) chance of going wrong, it you happen to be that one..Also, keep in mind that by statistics, you have more of  a chance of having something go wrong when taking medications.  Who should not have this procedure? If you are on a blood thinning medication (e.g. Coumadin, Plavix, see list of "Blood Thinners"), or if you have an active infection going on, you should not have the procedure.  If you are taking any blood thinners, please inform your physician.  How should I prepare for this procedure?  Do not eat or drink anything at least six hours prior to the procedure.  Bring a driver with you .  It cannot be a taxi.  Come accompanied by an adult that can drive you back, and that is strong enough to help you if your legs get weak or numb from the local anesthetic.  Take all of your medicines the morning of the procedure with just enough water to swallow them.  If you have diabetes, make sure that you are scheduled to have your procedure done first thing in the morning, whenever possible.  If you have diabetes, take only half of your insulin dose and notify our nurse that you have done so as soon as you arrive at the clinic.  If you are  diabetic, but only take blood sugar pills (oral hypoglycemic), then do not take them on the morning of your procedure.  You may take them after you have had the procedure.  Do not take aspirin or any aspirin-containing medications, at least eleven (11) days prior to the procedure.  They may prolong bleeding.  Wear loose fitting clothing that may be easy to take off and that you would not mind if it got stained with Betadine or blood.  Do not wear any jewelry or perfume  Remove any nail coloring.  It will interfere with some of our monitoring equipment.  NOTE: Remember that this is not meant to be interpreted as a complete list of all possible complications.  Unforeseen problems may occur.  BLOOD THINNERS The following drugs contain aspirin or other products, which can cause increased bleeding during surgery and should not be taken for 2 weeks prior to and 1 week after surgery.  If you should need take something for relief of minor pain, you may take acetaminophen which is found in Tylenol,m Datril, Anacin-3 and Panadol. It is not blood thinner. The products listed below are.  Do not take any of the products listed below in addition to any listed on your instruction sheet.  A.P.C or A.P.C with Codeine Codeine Phosphate Capsules #3 Ibuprofen Ridaura  ABC compound Congesprin Imuran rimadil  Advil Cope Indocin Robaxisal  Alka-Seltzer Effervescent Pain Reliever and Antacid Coricidin or Coricidin-D  Indomethacin Rufen  Alka-Seltzer plus Cold Medicine Cosprin Ketoprofen S-A-C Tablets  Anacin Analgesic Tablets or Capsules Coumadin Korlgesic Salflex  Anacin Extra Strength Analgesic tablets or capsules CP-2 Tablets Lanoril Salicylate  Anaprox Cuprimine Capsules Levenox Salocol  Anexsia-D Dalteparin Magan Salsalate  Anodynos Darvon compound Magnesium Salicylate Sine-off  Ansaid Dasin Capsules Magsal Sodium Salicylate  Anturane Depen Capsules Marnal Soma  APF Arthritis pain formula Dewitt's Pills  Measurin Stanback  Argesic Dia-Gesic Meclofenamic Sulfinpyrazone  Arthritis Bayer Timed Release Aspirin Diclofenac Meclomen Sulindac  Arthritis pain formula Anacin Dicumarol Medipren Supac  Analgesic (Safety coated) Arthralgen Diffunasal Mefanamic Suprofen  Arthritis Strength Bufferin Dihydrocodeine Mepro Compound Suprol  Arthropan liquid Dopirydamole Methcarbomol with Aspirin Synalgos  ASA tablets/Enseals Disalcid Micrainin Tagament  Ascriptin Doan's Midol Talwin  Ascriptin A/D Dolene Mobidin Tanderil  Ascriptin Extra Strength Dolobid Moblgesic Ticlid  Ascriptin with Codeine Doloprin or Doloprin with  Codeine Momentum Tolectin  Asperbuf Duoprin Mono-gesic Trendar  Aspergum Duradyne Motrin or Motrin IB Triminicin  Aspirin plain, buffered or enteric coated Durasal Myochrisine Trigesic  Aspirin Suppositories Easprin Nalfon Trillsate  Aspirin with Codeine Ecotrin Regular or Extra Strength Naprosyn Uracel  Atromid-S Efficin Naproxen Ursinus  Auranofin Capsules Elmiron Neocylate Vanquish  Axotal Emagrin Norgesic Verin  Azathioprine Empirin or Empirin with Codeine Normiflo Vitamin E  Azolid Emprazil Nuprin Voltaren  Bayer Aspirin plain, buffered or children's or timed BC Tablets or powders Encaprin Orgaran Warfarin Sodium  Buff-a-Comp Enoxaparin Orudis Zorpin  Buff-a-Comp with Codeine Equegesic Os-Cal-Gesic   Buffaprin Excedrin plain, buffered or Extra Strength Oxalid   Bufferin Arthritis Strength Feldene Oxphenbutazone   Bufferin plain or Extra Strength Feldene Capsules Oxycodone with Aspirin   Bufferin with Codeine Fenoprofen Fenoprofen Pabalate or Pabalate-SF   Buffets II Flogesic Panagesic   Buffinol plain or Extra Strength Florinal or Florinal with Codeine Panwarfarin   Buf-Tabs Flurbiprofen Penicillamine   Butalbital Compound Four-way cold tablets Penicillin   Butazolidin Fragmin Pepto-Bismol   Carbenicillin Geminisyn Percodan   Carna Arthritis Reliever Geopen Persantine    Carprofen Gold's salt Persistin   Chloramphenicol Goody's Phenylbutazone   Chloromycetin Haltrain Piroxlcam   Clmetidine heparin Plaquenil   Cllnoril Hyco-pap Ponstel   Clofibrate Hydroxy chloroquine Propoxyphen         Before stopping any of these medications, be sure to consult the physician who ordered them.  Some, such as Coumadin (Warfarin) are ordered to prevent or treat serious conditions such as "deep thrombosis", "pumonary embolisms", and other heart problems.  The amount of time that you may need off of the medication may also vary with the medication and the reason for which you were taking it.  If you are taking any of these medications, please make sure you notify your pain physician before you undergo any procedures.          Facet Joint Block The facet joints connect the bones of the spine (vertebrae). They make it possible for you to bend, twist, and make other movements with your spine. They also keep you from bending too far, twisting too far, and making other excessive movements. A facet joint block is a procedure where a numbing medicine (anesthetic) is injected into a facet joint. Often, a type of anti-inflammatory medicine called a steroid is also injected. A facet joint block may be done to diagnose neck or back pain. If the pain gets better after a facet joint block, it means the pain is probably coming from the facet joint. If the pain does not get better, it means the pain is probably not coming from the facet joint. A facet joint block may also be done to relieve neck or back pain caused by an inflamed facet joint. A facet joint block is only done to relieve pain if the pain does not improve with other methods, such as medicine, exercise programs, and physical therapy. Tell a health care provider about:  Any allergies you have.  All medicines you are taking, including vitamins, herbs, eye drops, creams, and over-the-counter medicines.  Any problems you or  family members have had with anesthetic medicines.  Any blood disorders you have.  Any surgeries you have had.  Any medical conditions you have.  Whether you are pregnant or may be pregnant. What are the risks? Generally, this is a safe procedure. However, problems may occur, including:  Bleeding.  Injury to a nerve near the injection site.  Pain at the  injection site.  Weakness or numbness in areas controlled by nerves near the injection site.  Infection.  Temporary fluid retention.  Allergic reactions to medicines or dyes.  Injury to other structures or organs near the injection site.  What happens before the procedure?  Follow instructions from your health care provider about eating or drinking restrictions.  Ask your health care provider about: ? Changing or stopping your regular medicines. This is especially important if you are taking diabetes medicines or blood thinners. ? Taking medicines such as aspirin and ibuprofen. These medicines can thin your blood. Do not take these medicines before your procedure if your health care provider instructs you not to.  Do not take any new dietary supplements or medicines without asking your health care provider first.  Plan to have someone take you home after the procedure. What happens during the procedure?  You may need to remove your clothing and dress in an open-back gown.  The procedure will be done while you are lying on an X-ray table. You will most likely be asked to lie on your stomach, but you may be asked to lie in a different position if an injection will be made in your neck.  Machines will be used to monitor your oxygen levels, heart rate, and blood pressure.  If an injection will be made in your neck, an IV tube will be inserted into one of your veins. Fluids and medicine will flow directly into your body through the IV tube.  The area over the facet joint where the injection will be made will be cleaned with  soap. The surrounding skin will be covered with clean drapes.  A numbing medicine (local anesthetic) will be applied to your skin. Your skin may sting or burn for a moment.  A video X-ray machine (fluoroscopy) will be used to locate the joint. In some cases, a CT scan may be used.  A contrast dye may be injected into the facet joint area to help locate the joint.  When the joint is located, an anesthetic will be injected into the joint through the needle.  Your health care provider will ask you whether you feel pain relief. If you do feel relief, a steroid may be injected to provide pain relief for a longer period of time. If you do not feel relief or feel only partial relief, additional injections of an anesthetic may be made in other facet joints.  The needle will be removed.  Your skin will be cleaned.  A bandage (dressing) will be applied over each injection site. The procedure may vary among health care providers and hospitals. What happens after the procedure?  You will be observed for 15-30 minutes before being allowed to go home. This information is not intended to replace advice given to you by your health care provider. Make sure you discuss any questions you have with your health care provider. Document Released: 05/11/2006 Document Revised: 01/21/2015 Document Reviewed: 09/15/2014 Elsevier Interactive Patient Education  2018 [place].  Epidural Steroid Injection An epidural steroid injection is a shot of steroid medicine and numbing medicine that is given into the space between the spinal cord and the bones in your back (epidural space). The shot helps relieve pain caused by an irritated or swollen nerve root. The amount of pain relief you get from the injection depends on what is causing the nerve to be swollen and irritated, and how long your pain lasts. You are more likely to benefit from this  injection if your pain is strong and comes on suddenly rather than if you  have had pain for a long time. Tell a health care provider about:  Any allergies you have.  All medicines you are taking, including vitamins, herbs, eye drops, creams, and over-the-counter medicines.  Any problems you or family members have had with anesthetic medicines.  Any blood disorders you have.  Any surgeries you have had.  Any medical conditions you have.  Whether you are pregnant or may be pregnant. What are the risks? Generally, this is a safe procedure. However, problems may occur, including:  Headache.  Bleeding.  Infection.  Allergic reaction to medicines.  Damage to your nerves.  What happens before the procedure? Staying hydrated Follow instructions from your health care provider about hydration, which may include:  Up to 2 hours before the procedure - you may continue to drink clear liquids, such as water, clear fruit juice, black coffee, and plain tea.  Eating and drinking restrictions Follow instructions from your health care provider about eating and drinking, which may include:  8 hours before the procedure - stop eating heavy meals or foods such as meat, fried foods, or fatty foods.  6 hours before the procedure - stop eating light meals or foods, such as toast or cereal.  6 hours before the procedure - stop drinking milk or drinks that contain milk.  2 hours before the procedure - stop drinking clear liquids.  Medicine  You may be given medicines to lower anxiety.  Ask your health care provider about: ? Changing or stopping your regular medicines. This is especially important if you are taking diabetes medicines or blood thinners. ? Taking medicines such as aspirin and ibuprofen. These medicines can thin your blood. Do not take these medicines before your procedure if your health care provider instructs you not to. General instructions  Plan to have someone take you home from the hospital or clinic. What happens during the  procedure?  You may receive a medicine to help you relax (sedative).  You will be asked to lie on your abdomen.  The injection site will be cleaned.  A numbing medicine (local anesthetic) will be used to numb the injection site.  A needle will be inserted through your skin into the epidural space. You may feel some discomfort when this happens. An X-ray machine will be used to make sure the needle is put as close as possible to the affected nerve.  A steroid medicine and a local anesthetic will be injected into the epidural space.  The needle will be removed.  A bandage (dressing) will be put over the injection site. What happens after the procedure?  Your blood pressure, heart rate, breathing rate, and blood oxygen level will be monitored until the medicines you were given have worn off.  Your arm or leg may feel weak or numb for a few hours.  The injection site may feel sore.  Do not drive for 24 hours if you received a sedative. This information is not intended to replace advice given to you by your health care provider. Make sure you discuss any questions you have with your health care provider. Document Released: 03/29/2007 Document Revised: 06/03/2015 Document Reviewed: 04/07/2015 [employer] Interactive Patient Education  2017 [employer].

## 2016-11-21 NOTE — Progress Notes (Signed)
Patient's Name: Lori Mckenzie  MRN: [id number]  Referring Provider: Ardine Eng, MD  DOB: [birth year]  PCP: Ardine Eng, MD  DOS: 11/21/2016  Note by: Vevelyn Francois NP  Service setting: Ambulatory outpatient  Specialty: Interventional Pain Management  Location: [facility] (AMB) Pain Management Facility    Patient type: Established    Primary Reason(s) for Visit: Evaluation of chronic illnesses with exacerbation, or progression (Level of risk: moderate) CC: Back Pain (lower more on the left); Abdominal Pain (pain in the left waist and around waist line); and Leg Pain (restless)  HPI  [name] is a 51 y.o. year old, female patient, who comes today for a follow-up evaluation. She has Type 2 diabetes mellitus ([place]); COPD with acute exacerbation (HCC); GERD (gastroesophageal reflux disease); OSA on CPAP; Anxiety; Steatohepatitis; Dysthymia; Other social stressor; Ascites; NASH (nonalcoholic steatohepatitis); Hypokalemia; Opiate use (75 MME/Day); Long term prescription opiate use; Long term current use of opiate analgesic; Encounter for therapeutic drug level monitoring; Chronic epigastric abdominal pain; Chronic low back pain (Primary Source of Pain) (Bilateral) (R>L); Chronic neck pain (Tertiary source of pain) (Bilateral) (R>L); Breath shortness; Diastolic dysfunction; Clinical depression; Airway hyperreactivity; Essential (primary) hypertension; 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, L1-2, & L4-5); Lumbar and sacral osteoarthritis; Myofascial pain; Drowsiness; Episode of syncope; Subacute lumbar radiculopathy (left side) (S1 dermatome); Vitamin D deficiency; B12 deficiency; Folate deficiency; Thoracic radiculitis; Elevated sedimentation rate; Elevated C-reactive protein (CRP); Lumbar facet syndrome (Bilateral) (R>L); Cervical spondylosis; Lumbar spondylosis; Encounter for chronic pain management; Chronic shoulder pain (Bilateral) (R>L); Chronic carpal tunnel syndrome (Bilateral); Chronic hip pain (Bilateral)  (L>R); Chronic upper back pain (Bilateral) (L>R); Osteoporosis, idiopathic; Abnormal MRI, lumbar spine (02/03/2015); Hepatic encephalopathy ([place]); Radicular pain of thoracic region; Altered mental status; Cirrhosis of liver without ascites ([place]); Elevated liver enzymes; Opiate withdrawal ([place]); Chronic pain syndrome; Asthma; Lumbar spinal stenosis; Lumbosacral spondylosis without myelopathy; Neuromyositis; Somnolence; Lumbar central spinal stenosis (L1-2 and L4-5); Spondylosis of lumbar region without myelopathy or radiculopathy; Allodynia; Osteoarthritis; Chronic upper extremity pain (Left); Chronic radicular cervical pain (L); Chronic sacroiliac joint pain (Bilateral) (L>R); Diabetes mellitus, insulin dependent (IDDM), uncontrolled ([place]); Chronic lower extremity pain (Bilateral); Spinal stenosis, thoracic region (T10-11); Nausea without vomiting; Cirrhosis ([place]); Constipation; Hyperglycemia; Uncontrolled diabetes mellitus ([place]); Abdominal pain; Low back pain due to L1-2 disc extrusion (caudal) (Left); Acute postoperative pain; Acute hepatic encephalopathy; and Chronic feet pain (Secondary source of pain) (Bilateral) (R>L) on their problem list. [name] was last seen on 10/27/2016. Her primarily concern today is the Back Pain (lower more on the left); Abdominal Pain (pain in the left waist and around waist line); and Leg Pain (restless)  Pain Assessment: Location: Lower, Left, Right Back Radiating: into the buttocks  Onset: More than a month ago Duration: Chronic pain Quality: Aching, Constant, Discomfort, Throbbing, Tender, Shooting Severity: 8 /10 (self-reported pain score)  Note: Reported level is compatible with observation. Clinically the patient looks like a 3/10       Information on the proper use of the pain scale provided to the patient today. When using our objective Pain Scale, levels between 6 and 10/10 are said to belong in an emergency room, as it progressively worsens from a 6/10,  described as severely limiting, requiring emergency care not usually available at an outpatient pain management facility. At a 6/10 level, communication becomes difficult and requires great effort. Assistance to reach the emergency department may be required. Facial flushing and profuse sweating along with potentially dangerous increases in heart rate and blood  pressure will be evident. Effect on ADL: slow movement and has had to help from husband and sister Timing: Constant Modifying factors: nothing makes it better, heat and cold. jacuzzi bath helps  Further details on both, my assessment(s), as well as the proposed treatment plan, please see below. She continues to have pain. She continues to ask for pain medication. She states that she is having leg and back pain. She states that her CBG is controlled and the she is aware of the need for steroid. She states that she does not want her grandchildren to see her in pain. She states that she has spoken with Dr Lowella Dandy about her medication and that he understood that she needs medication for her pain management. Shesis SP lumbar RFA in August 2018.   Laboratory Chemistry  Inflammation Markers (CRP: Acute Phase) (ESR: Chronic Phase) Lab Results  Component Value Date   CRP <0.8 04/12/2016   ESRSEDRATE 4 04/12/2016                 Renal Function Markers Lab Results  Component Value Date   BUN 7 10/24/2016   CREATININE 0.64 10/24/2016   GFRAA >60 10/24/2016   GFRNONAA >60 10/24/2016                 Hepatic Function Markers Lab Results  Component Value Date   AST 33 10/24/2016   ALT 30 10/24/2016   ALBUMIN 3.6 10/24/2016   ALKPHOS 51 10/24/2016                 Electrolytes Lab Results  Component Value Date   NA 140 10/24/2016   K 3.2 (L) 10/24/2016   CL 97 (L) 10/24/2016   CALCIUM 8.8 (L) 10/24/2016   MG 1.7 10/25/2016                 Neuropathy Markers Lab Results  Component Value Date   VITAMINB12 272 04/12/2016                  Bone Pathology Markers Lab Results  Component Value Date   ALKPHOS 51 10/24/2016   VD25OH 14.3 (L) 01/08/2015   VD125OH2TOT 28.8 01/08/2015   25OHVITD1 36 04/12/2016   25OHVITD2 3.9 04/12/2016   25OHVITD3 32 04/12/2016   CALCIUM 8.8 (L) 10/24/2016                 Rheumatology Markers No results found for: LABURIC, URICUR              Coagulation Parameters Lab Results  Component Value Date   INR 1.18 12/01/2015   LABPROT 15.1 12/01/2015   APTT 24.6 08/26/2013   PLT 69 (L) 10/25/2016                 Cardiovascular Markers Lab Results  Component Value Date   BNP 11 12/11/2012   CKTOTAL 1,282 (H) 08/26/2013   CKMB 8.6 (H) 08/26/2013   TROPONINI <0.03 07/12/2016   HGB 12.7 10/25/2016   HCT 37.3 10/25/2016                 CA Markers No results found for: CEA, CA125, LABCA2               Note: Lab results reviewed.  Recent Diagnostic Imaging Review   Shoulder Imaging:  Shoulder-R DG:  Results for orders placed during the hospital encounter of 08/25/14  DG Shoulder Right   Narrative CLINICAL DATA:  Right shoulder pain.  EXAM: RIGHT SHOULDER -  2+ VIEW  COMPARISON:  None.  FINDINGS: There is no evidence of fracture or dislocation. There is no evidence of arthropathy or other focal bone abnormality. Soft tissues are unremarkable.  IMPRESSION: Negative.   Electronically Signed   By: Kerby Moors M.D.   On: 08/26/2014 17:40     Thoracic Imaging: Thoracic MR wo contrast:  Results for orders placed during the hospital encounter of 03/06/15  MR Thoracic Spine Wo Contrast   Narrative CLINICAL DATA:  Diffuse mid back pain with bilateral arm and leg pain since 2008.  EXAM: MRI THORACIC SPINE WITHOUT CONTRAST  TECHNIQUE: Multiplanar, multisequence MR imaging of the thoracic spine was performed. No intravenous contrast was administered.  COMPARISON:  08/06/2014  FINDINGS: Small hemangiomas noted in the T3 and T11 vertebra. There is increased  T2 signal in the right transverse process of T6, probably with increased T1 signal on image 3 series 5, and with trabecular coarsening in this vicinity on prior exams including the chest CT from 08/05/2007, favoring hemangioma.  Several scattered tiny T2 hyperintense lesions are present in the liver and are nonspecific.  No significant abnormal spinal cord signal is observed. There is mild dextroconvex thoracic scoliosis but no malalignment.  Several small Schmorl's nodes are present in the lower thoracic spine.  Additional findings at individual levels are as follows:  T1-2:  No impingement.  Central disc protrusion.  T2-3: Moderate right foraminal stenosis due to right facet arthropathy. Central disc protrusion.  T3-4:  Mild right foraminal stenosis due to facet arthropathy.  T4-5:  Mild right foraminal stenosis due to facet arthropathy.  T5-6: Mild left eccentric central narrowing of the thecal sac due to left facet arthropathy, image 17 series 7.  T6-7: Mild left foraminal stenosis due to intervertebral spurring. Disc bulge.  T7-8: Borderline central narrowing of the thecal sac due to focal right paracentral spurring.  T8-9:  Mild bilateral foraminal stenosis due to facet arthropathy.  T9-10:  Borderline left foraminal stenosis due to facet arthropathy.  T10-11: Moderate central narrowing of the thecal sac due to a central disc protrusion with associated spurring. Mild left foraminal stenosis due to facet arthropathy.  T11-12: Mild left foraminal stenosis due to left paracentral disc protrusion and facet spurring.  T12-L1:  No impingement.  Right paracentral disc protrusion.  L1-2: No impingement. Central disc protrusion. This level is only included on the parasagittal images.  IMPRESSION: 1. Spondylosis and degenerative disc disease in the thoracic spine, causing moderate impingement at T2-3 and T10-11 ; and mild impingement at T3-4, T4-5, T5-6, T6-7, T8-9,  and T11-12, as detailed above. 2. Several vertebral hemangiomas, the largest of which is in the right T6 transverse process. 3. Scattered tiny T2 hyperintense liver lesions are probably cysts and have been seen on prior CT scans, but technically nonspecific on today's MRI.   Electronically Signed   By: Van Clines M.D.   On: 03/06/2015 11:52     Lumbosacral Imaging: Lumbar MR wo contrast:  Results for orders placed during the hospital encounter of 02/03/15  MR Lumbar Spine Wo Contrast   Narrative CLINICAL DATA:  51 year old female with right side lumbar back pain for 2 weeks, progressively increasing. Left S1 radicular pain. Initial encounter. Remote fall in 2008.  EXAM: MRI LUMBAR SPINE WITHOUT CONTRAST  TECHNIQUE: Multiplanar, multisequence MR imaging of the lumbar spine was performed. No intravenous contrast was administered.  COMPARISON:  CT Abdomen and Pelvis 01/29/2015 and earlier  FINDINGS: Normal lumbar segmentation demonstrated on the comparison. Straightening  of lumbar lordosis, otherwise normal vertebral height and alignment. Bone marrow signal within normal limits. No marrow edema or evidence of acute osseous abnormality. Visible sacrum intact.  No signal abnormality in the visualized lower thoracic spinal cord. Conus medullaris appears normal at L1-L2.  Decreased T2 signal in the visible liver and spleen. Stable visualized abdominal viscera, small retroperitoneal lymph nodes. Negative visualized posterior paraspinal soft tissues.  T10-T11: Bulky but incompletely visualized central disc extrusion suggested at T10-T11 (series 2, image 8). This disc herniation is seen to be partially calcified on the recent CT Abdomen and Pelvis comparison. Those images suggest moderate associated lower thoracic spinal stenosis.  T11-T12: Small central disc protrusion. Mild to moderate left facet hypertrophy. Moderate left T11 foraminal stenosis.  T12-L1: Right  paracentral disc protrusion with no significant stenosis.  L1-L2: Left paracentral and caudal disc extrusion (series 5, image 10) is mild. Borderline to mild spinal stenosis at this level without foraminal involvement.  L2-L3:  Minimal disc bulge.  Mild facet hypertrophy.  No stenosis.  L3-L4:  Mild disc bulge.  Moderate facet hypertrophy.  No stenosis.  L4-L5: Mild circumferential disc bulge. Moderate to severe facet hypertrophy. Borderline to mild spinal stenosis (series 5, image 29). No convincing lateral recess or foraminal stenosis.  L5-S1:  Mild disc bulge and facet hypertrophy.  No stenosis.  IMPRESSION: 1. Disc degeneration maximal in the lower thoracic and upper lumbar spine. Suspect up to moderate lower thoracic spinal stenosis at T10-T11 related to a partially calcified disc herniation which is partially visible on this study. Follow-up thoracic spine MRI would evaluate further as necessary. 2. Mild multifactorial lumbar spinal stenosis at L1-L2 and L4-L5. 3. Moderate left T11 neural foraminal stenosis. No lumbar foraminal stenosis. 4.  No acute osseous abnormality in the lumbar spine.   Electronically Signed   By: Genevie Ann M.D.   On: 02/03/2015 11:42     Results for orders placed during the hospital encounter of 09/02/16  CT Lumbar Spine Wo Contrast   Narrative CLINICAL DATA:  Initial evaluation for chronic back pain, recent spine injection.  EXAM: CT LUMBAR SPINE WITHOUT CONTRAST  TECHNIQUE: Multidetector CT imaging of the lumbar spine was performed without intravenous contrast administration. Multiplanar CT image reconstructions were also generated.  COMPARISON:  Prior MRI from 02/03/2015.  FINDINGS: Segmentation: Normal segmentation. Lowest well-formed disc labeled the L5-S1 level.  Alignment: Trace levoscoliosis with apex at L4-5. Mild straightening of the normal lumbar lordosis. Trace anterolisthesis of L4 on L5. No malalignment.  Vertebrae:  Vertebral body heights maintained. No evidence for acute or chronic fracture. Few scattered degenerative endplate Schmorl's nodes noted, most notable about the L2-3 interspace. No discrete osseous lesions. Visualized sacrum intact. SI joints approximated and symmetric.  Paraspinal and other soft tissues: Paraspinous soft tissues demonstrate no acute abnormality. Mild atherosclerotic plaque within the infrarenal aorta. Visualized visceral structures otherwise unremarkable.  Disc levels:  T11-12: Shallow posterior osseous ridging mildly indents the ventral thecal sac. No canal or foraminal stenosis.  T12-L1: Right paracentral disc protrusion with associated spurring mildly indents the ventral thecal sac. No significant stenosis. This is stable from previous.  L1-2: Small left paracentral disc protrusion indents the ventral thecal sac. Mild facet arthrosis. No significant canal or foraminal stenosis. This is stable from previous.  L2-3: Minimal disc bulge with mild bilateral facet arthropathy. No stenosis.  L3-4: Minimal annular disc bulge. Moderate bilateral facet arthrosis. No significant stenosis.  L4-5: Trace anterolisthesis. Minimal annular disc bulge. Moderate bilateral facet arthrosis. Resultant mild  to moderate spinal stenosis, unchanged. No significant foraminal encroachment.  L5-S1:  Mild disc bulge and facet hypertrophy.  No stenosis.  Overall appearance of the degenerative changes are not significantly changed relative to previous MRI. No complication identified status post recent injection.  IMPRESSION: 1. No acute abnormality within the lumbar spine. No complication identified status post recent injection. 2. Multilevel disc degeneration with facet arthropathy as above, most notable at L4-5. Overall, appearance is not significantly changed relative to most recent MRI from 02/03/2015.   Electronically Signed   By: Jeannine Boga M.D.   On: 09/02/2016  19:51    Hip Imaging:  Hip-R DG 2-3 views:  Results for orders placed during the hospital encounter of 04/21/16  DG HIP UNILAT W OR W/O PELVIS 2-3 VIEWS RIGHT   Narrative CLINICAL DATA:  Chronic bilateral hip pain for at least the past 10 years. No known injury  EXAM: DG HIP (WITH OR WITHOUT PELVIS) 2-3V RIGHT  COMPARISON:  CT scan the abdomen pelvis of June 24, 2015  FINDINGS: The right hemipelvis is subjectively adequately mineralized. No lytic or blastic bony lesion is observed. AP and lateral views of the right hip reveal preservation of the joint space. The articular surfaces of the femoral head and acetabulum remains smoothly rounded. The femoral neck, intertrochanteric, and subtrochanteric regions are normal.  IMPRESSION: There is no acute or significant chronic bony abnormality of the right hip.   Electronically Signed   By: David  Martinique M.D.   On: 04/21/2016 15:00    Hip-L DG 2-3 views:  Results for orders placed during the hospital encounter of 04/21/16  DG HIP UNILAT W OR W/O PELVIS 2-3 VIEWS LEFT   Narrative CLINICAL DATA:  Bilateral hip pain and limited range of motion for the past 10 years. No known injury.  EXAM: DG HIP (WITH OR WITHOUT PELVIS) 2-3V LEFT  COMPARISON:  Abdomen pelvis CT dated 06/24/2015.  FINDINGS: Curvilinear metallic densities in the left pelvis and lower abdomen, corresponding to the inferior epigastric artery embolization coils on the previous CT. The visualized bowel gas pattern is normal. Bilateral pelvic phleboliths. Unremarkable bones.  IMPRESSION: No acute abnormality.   Electronically Signed   By: Claudie Revering M.D.   On: 04/21/2016 15:00     Complexity Note: Imaging results reviewed. Results shared with [name], using Layman's terms.                         Meds   Current Outpatient Medications:  .  albuterol (PROVENTIL HFA;VENTOLIN HFA) 108 (90 Base) MCG/ACT inhaler, Inhale 2 puffs into the lungs every 6  (six) hours as needed for wheezing or shortness of breath., Disp: , Rfl:  .  cholecalciferol (VITAMIN D) 1000 units tablet, Take 2,000 Units by mouth daily., Disp: , Rfl:  .  ciprofloxacin (CIPRO) 500 MG tablet, Take 1 tablet (500 mg total) by mouth 2 (two) times daily., Disp: 10 tablet, Rfl: 0 .  citalopram (CELEXA) 20 MG tablet, Take 20-30 mg by mouth daily. , Disp: , Rfl:  .  dicyclomine (BENTYL) 10 MG capsule, Take 10 mg by mouth 4 (four) times daily. , Disp: , Rfl:  .  fluticasone (FLONASE) 50 MCG/ACT nasal spray, Place 1-2 sprays into both nostrils daily. , Disp: , Rfl:  .  Folate-B12-Intrinsic Factor (INTRINSI B12-FOLATE) 245-809-98 MCG-MCG-MG TABS, Take 1 tablet by mouth daily. Reported on 04/06/2015, Disp: , Rfl:  .  furosemide (LASIX) 40 MG tablet, Take 1  tablet (40 mg total) by mouth 2 (two) times daily., Disp: 60 tablet, Rfl: 0 .  gabapentin (NEURONTIN) 300 MG capsule, Take 600 mg by mouth 4 (four) times daily. , Disp: , Rfl:  .  insulin detemir (LEVEMIR) 100 UNIT/ML injection, Inject 0.25 mLs (25 Units total) into the skin 2 (two) times daily., Disp: 100 mL, Rfl: 1 .  ipratropium-albuterol (DUONEB) 0.5-2.5 (3) MG/3ML SOLN, Take 3 mLs by nebulization every 4 (four) hours as needed. For shortness of breath/wheezing, Disp: , Rfl:  .  lactulose (CHRONULAC) 10 GM/15ML solution, Take 30 mLs (20 g total) by mouth 3 (three) times daily., Disp: 1892 mL, Rfl: 0 .  nystatin (MYCOSTATIN/NYSTOP) powder, Apply 1 g topically 3 (three) times daily as needed. For irritation, Disp: 30 g, Rfl: 0 .  pantoprazole (PROTONIX) 40 MG tablet, Take 40 mg by mouth 2 (two) times daily before a meal., Disp: , Rfl:  .  potassium chloride SA (K-DUR,KLOR-CON) 20 MEQ tablet, Take 10-20 mEq by mouth daily as needed. For high blood pressure, Disp: , Rfl:  .  promethazine (PHENERGAN) 12.5 MG tablet, Take 2 tablets (25 mg total) by mouth every 6 (six) hours as needed for nausea or vomiting., Disp: 30 tablet, Rfl: 0 .   rifaximin (XIFAXAN) 550 MG TABS tablet, Take 550 mg by mouth 2 (two) times daily. , Disp: , Rfl:  .  rizatriptan (MAXALT) 10 MG tablet, Take 10 mg by mouth as needed for migraine. , Disp: , Rfl:  .  spironolactone (ALDACTONE) 100 MG tablet, Take 100 mg by mouth daily. , Disp: , Rfl:  .  vitamin B-12 (CYANOCOBALAMIN) 1000 MCG tablet, Take 2,000 mcg by mouth daily., Disp: , Rfl:  .  diclofenac sodium (VOLTAREN) 1 % GEL, Apply 4 g 4 (four) times daily topically., Disp: 100 g, Rfl: 2  ROS  Constitutional: Denies any fever or chills Gastrointestinal: No reported hemesis, hematochezia, vomiting, or acute GI distress Musculoskeletal: Denies any acute onset joint swelling, redness, loss of ROM, or weakness Neurological: No reported episodes of acute onset apraxia, aphasia, dysarthria, agnosia, amnesia, paralysis, loss of coordination, or loss of consciousness  Allergies  [name] is allergic to tape and vicodin [hydrocodone-acetaminophen].  PFSH  Drug: [name]  reports that she does not use drugs. Alcohol:  reports that she does not drink alcohol. Tobacco:  reports that  has never smoked. she has never used smokeless tobacco. Medical:  has a past medical history of Abdominal abscess (08/25/2014), Acid reflux (08/10/2010), Acute cervical myofascial strain (02/09/2016), Anxiety, Ascites, Asthma, Back pain, Bile leak, postoperative (07/17/2014), Brittle bone disease, Cancer ([place]), Cervical disc disease, Chronic kidney disease, Collagen vascular disease ([place]), COPD (chronic obstructive pulmonary disease) ([place]), Diabetes mellitus without complication ([place]), GERD (gastroesophageal reflux disease), Hypertension, Hypothyroidism, Left upper quadrant pain (01/09/2014), Major depressive disorder with single episode (12/05/2011), Major depressive disorder, single episode (12/05/2011), Migraines, NASH (nonalcoholic steatohepatitis), Respiratory infection, Shock ([place]) (09/18/2014), Sleep apnea, Sleep apnea, Syncope  (11/16/2014), Thyroid disease, and TIA (transient ischemic attack). Surgical: [name]  has a past surgical history that includes Abdominal hysterectomy; Tubal ligation; Colonoscopy with propofol (N/A, 06/23/2014); Esophagogastroduodenoscopy (N/A, 06/23/2014); Cholecystectomy (N/A, 07/15/2014); ERCP (N/A, 07/16/2014); Wisdom tooth extraction; ERCP (N/A, 10/03/2014); Esophagogastroduodenoscopy (egd) with propofol (N/A, 12/01/2015); and Esophagogastroduodenoscopy (N/A, 06/30/2016). Family: family history includes Emphysema in her father; Heart disease in her brother and sister; Lung cancer in her mother; Ulcers in her father and sister.  Constitutional Exam  General appearance: Well nourished, well developed, and well hydrated.  In no apparent acute distress Vitals:   11/21/16 1017  BP: (!) 117/53  Pulse: 95  Resp: 16  Temp: 98.2 F (36.8 C)  TempSrc: Oral  SpO2: 96%  Weight: 270 lb (122.5 kg)  Height: _0  (1.575 m)  Psych/Mental status: Alert, oriented x 3 (person, place, & time)       Eyes: PERLA Respiratory: No evidence of acute respiratory distress  Cervical Spine Area Exam  Skin & Axial Inspection: No masses, redness, edema, swelling, or associated skin lesions Alignment: Symmetrical Functional ROM: Unrestricted ROM      Stability: No instability detected Muscle Tone/Strength: Functionally intact. No obvious neuro-muscular anomalies detected. Sensory (Neurological): Unimpaired Palpation: No palpable anomalies              Upper Extremity (UE) Exam    Side: Right upper extremity  Side: Left upper extremity  Skin & Extremity Inspection: Skin color, temperature, and hair growth are WNL. No peripheral edema or cyanosis. No masses, redness, swelling, asymmetry, or associated skin lesions. No contractures.  Skin & Extremity Inspection: Skin color, temperature, and hair growth are WNL. No peripheral edema or cyanosis. No masses, redness, swelling, asymmetry, or associated skin lesions. No  contractures.  Functional ROM: Unrestricted ROM          Functional ROM: Unrestricted ROM          Muscle Tone/Strength: Functionally intact. No obvious neuro-muscular anomalies detected.  Muscle Tone/Strength: Functionally intact. No obvious neuro-muscular anomalies detected.  Sensory (Neurological): Unimpaired          Sensory (Neurological): Unimpaired          Palpation: No palpable anomalies              Palpation: No palpable anomalies              Specialized Test(s): Deferred         Specialized Test(s): Deferred          Thoracic Spine Area Exam  Skin & Axial Inspection: No masses, redness, or swelling Alignment: Symmetrical Functional ROM: Unrestricted ROM Stability: No instability detected Muscle Tone/Strength: Functionally intact. No obvious neuro-muscular anomalies detected. Sensory (Neurological): Unimpaired Muscle strength & Tone: No palpable anomalies  Lumbar Spine Area Exam  Skin & Axial Inspection: No masses, redness, or swelling Alignment: Symmetrical Functional ROM: Unrestricted ROM      Stability: No instability detected Muscle Tone/Strength: Functionally intact. No obvious neuro-muscular anomalies detected. Sensory (Neurological): Unimpaired Palpation: No palpable anomalies       Provocative Tests: Lumbar Hyperextension and rotation test: Positive bilaterally for facet joint pain. Lumbar Lateral bending test: evaluation deferred today       Patrick's Maneuver: evaluation deferred today                    Gait & Posture Assessment  Ambulation: Unassisted Gait: Relatively normal for age and body habitus Posture: WNL   Lower Extremity Exam    Side: Right lower extremity  Side: Left lower extremity  Skin & Extremity Inspection: Skin color, temperature, and hair growth are WNL. No peripheral edema or cyanosis. No masses, redness, swelling, asymmetry, or associated skin lesions. No contractures.  Skin & Extremity Inspection: Skin color, temperature, and hair growth  are WNL. No peripheral edema or cyanosis. No masses, redness, swelling, asymmetry, or associated skin lesions. No contractures.  Functional ROM: Unrestricted ROM          Functional ROM: Unrestricted ROM  Muscle Tone/Strength: Functionally intact. No obvious neuro-muscular anomalies detected.  Muscle Tone/Strength: Functionally intact. No obvious neuro-muscular anomalies detected.  Sensory (Neurological): Unimpaired  Sensory (Neurological): Unimpaired  Palpation: No palpable anomalies  Palpation: No palpable anomalies   Assessment  Primary Diagnosis & Pertinent Problem List: The primary encounter diagnosis was Chronic low back pain (Primary Source of Pain) (Bilateral) (R>L). Diagnoses of Chronic lower extremity pain (Bilateral), Lumbar facet syndrome (Bilateral) (R>L), Chronic pain syndrome, and Osteoarthritis were also pertinent to this visit.  Status Diagnosis  Controlled Controlled Controlled 1. Chronic low back pain (Primary Source of Pain) (Bilateral) (R>L)   2. Chronic lower extremity pain (Bilateral)   3. Lumbar facet syndrome (Bilateral) (R>L)   4. Chronic pain syndrome   5. Osteoarthritis     Problems updated and reviewed during this visit: No problems updated. Plan of Care  Pharmacotherapy (Medications Ordered): Meds ordered this encounter  Medications  . diclofenac sodium (VOLTAREN) 1 % GEL    Sig: Apply 4 g 4 (four) times daily topically.    Dispense:  100 g    Refill:  2    Do not add this medication to the electronic "Automatic Refill" notification system. Patient may have prescription filled one day early if pharmacy is closed on scheduled refill date.    Order Specific Question:   Supervising Provider    Answer:   Milinda Pointer [[id number]]  This SmartLink is deprecated. Use AVSMEDLIST instead to display the medication list for a patient. Medications administered today: Alexes Menchaca had no medications administered during this visit. Lab-work, procedure(s),  and/or referral(s): Orders Placed This Encounter  Procedures  . Lumbar Epidural Injection  . LUMBAR FACET(MEDIAL BRANCH NERVE BLOCK) MBNB   Imaging and/or referral(s): None  Interventional therapies: Planned, scheduled, and/or pending:   Bilateral Lumbar Facet NB with Sedation   Considering:  Diagnostic bilateral cervical facetblock Diagnostic L1-2 lumbar epidural steroid injection Diagnostic, right-sided L4-5 lumbar epiduralsteroid injection Diagnostic bilateral intra-articular shoulderjoint injection Diagnostic bilateral suprascapular nerveblock Possible bilateral suprascapular nerve radiofrequencyablation Palliative left-sided L5-S1 lumbar epiduralsteroid injection  Diagnostic left S1 selective nerveroot block Diagnostic right-sided cervical epiduralsteroid injection Diagnostic T10-11 thoracic epiduralsteroid injection Diagnostic bilateral intra-articular hipjoint injection Possible bilateral hip radiofrequencyablation   Palliative PRN treatment(s):  Diagnostic L1-2 lumbar epidural steroidinjection Diagnostic, right-sided L4-5 lumbar epidural steroidinjection Diagnostic T10-11 thoracic epidural steroidinjection       Provider-requested follow-up: Return for w/ Dr. Dossie Arbour, Procedure(w/Sedation), (ASAP).  Future Appointments  Date Time Provider [place]  12/06/2016  9:30 AM Milinda Pointer, MD [facility] None   Primary Care Physician: Ardine Eng, MD Location: [facility] Note by: Vevelyn Francois NP Date: 11/21/2016; Time: 3:24 PM  Pain Score Disclaimer: We use the NRS-11 scale. This is a self-reported, subjective measurement of pain severity with only modest accuracy. It is used primarily to identify changes within a particular patient. It must be understood that outpatient pain scales are significantly less accurate that those used for research, where they can be applied under ideal controlled circumstances  with minimal exposure to variables. In reality, the score is likely to be a combination of pain intensity and pain affect, where pain affect describes the degree of emotional arousal or changes in action readiness caused by the sensory experience of pain. Factors such as social and work situation, setting, emotional state, anxiety levels, expectation, and prior pain experience may influence pain perception and show large inter-individual differences that may also be affected by time variables.  Patient instructions provided  during this appointment: Patient Instructions   ____________________________________________________________________________________________  Pain Scale  Introduction: The pain score used by this practice is the Verbal Numerical Rating Scale (VNRS-11). This is an 11-point scale. It is for adults and children 10 years or older. There are significant differences in how the pain score is reported, used, and applied. Forget everything you learned in the past and learn this scoring system.  General Information: The scale should reflect your current level of pain. Unless you are specifically asked for the level of your worst pain, or your average pain. If you are asked for one of these two, then it should be understood that it is over the past 24 hours.  Basic Activities of Daily Living (ADL): Personal hygiene, dressing, eating, transferring, and using restroom.  Instructions: Most patients tend to report their level of pain as a combination of two factors, their physical pain and their psychosocial pain. This last one is also known as "suffering" and it is reflection of how physical pain affects you socially and psychologically. From now on, report them separately. From this point on, when asked to report your pain level, report only your physical pain. Use the following table for reference.  Pain Clinic Pain Levels (0-5/10)  Pain Level Score  Description  No Pain 0   Mild pain 1  Nagging, annoying, but does not interfere with basic activities of daily living (ADL). Patients are able to eat, bathe, get dressed, toileting (being able to get on and off the toilet and perform personal hygiene functions), transfer (move in and out of bed or a chair without assistance), and maintain continence (able to control bladder and bowel functions). Blood pressure and heart rate are unaffected. A normal heart rate for a healthy adult ranges from 60 to 100 bpm (beats per minute).   Mild to moderate pain 2 Noticeable and distracting. Impossible to hide from other people. More frequent flare-ups. Still possible to adapt and function close to normal. It can be very annoying and may have occasional stronger flare-ups. With discipline, patients may get used to it and adapt.   Moderate pain 3 Interferes significantly with activities of daily living (ADL). It becomes difficult to feed, bathe, get dressed, get on and off the toilet or to perform personal hygiene functions. Difficult to get in and out of bed or a chair without assistance. Very distracting. With effort, it can be ignored when deeply involved in activities.   Moderately severe pain 4 Impossible to ignore for more than a few minutes. With effort, patients may still be able to manage work or participate in some social activities. Very difficult to concentrate. Signs of autonomic nervous system discharge are evident: dilated pupils (mydriasis); mild sweating (diaphoresis); sleep interference. Heart rate becomes elevated (>115 bpm). Diastolic blood pressure (lower number) rises above 100 mmHg. Patients find relief in laying down and not moving.   Severe pain 5 Intense and extremely unpleasant. Associated with frowning face and frequent crying. Pain overwhelms the senses.  Ability to do any activity or maintain social relationships becomes significantly limited. Conversation becomes difficult. Pacing back and forth is common, as getting into a  comfortable position is nearly impossible. Pain wakes you up from deep sleep. Physical signs will be obvious: pupillary dilation; increased sweating; goosebumps; brisk reflexes; cold, clammy hands and feet; nausea, vomiting or dry heaves; loss of appetite; significant sleep disturbance with inability to fall asleep or to remain asleep. When persistent, significant weight loss is observed due to the complete  loss of appetite and sleep deprivation.  Blood pressure and heart rate becomes significantly elevated. Caution: If elevated blood pressure triggers a pounding headache associated with blurred vision, then the patient should immediately seek attention at an urgent or emergency care unit, as these may be signs of an impending stroke.    Emergency Department Pain Levels (6-10/10)  Emergency Room Pain 6 Severely limiting. Requires emergency care and should not be seen or managed at an outpatient pain management facility. Communication becomes difficult and requires great effort. Assistance to reach the emergency department may be required. Facial flushing and profuse sweating along with potentially dangerous increases in heart rate and blood pressure will be evident.   Distressing pain 7 Self-care is very difficult. Assistance is required to transport, or use restroom. Assistance to reach the emergency department will be required. Tasks requiring coordination, such as bathing and getting dressed become very difficult.   Disabling pain 8 Self-care is no longer possible. At this level, pain is disabling. The individual is unable to do even the most "basic" activities such as walking, eating, bathing, dressing, transferring to a bed, or toileting. Fine motor skills are lost. It is difficult to think clearly.   Incapacitating pain 9 Pain becomes incapacitating. Thought processing is no longer possible. Difficult to remember your own name. Control of movement and coordination are lost.   The worst pain  imaginable 10 At this level, most patients pass out from pain. When this level is reached, collapse of the autonomic nervous system occurs, leading to a sudden drop in blood pressure and heart rate. This in turn results in a temporary and dramatic drop in blood flow to the brain, leading to a loss of consciousness. Fainting is one of the body's self defense mechanisms. Passing out puts the brain in a calmed state and causes it to shut down for a while, in order to begin the healing process.    Summary: 1. Refer to this scale when providing [place] with your pain level. 2. Be accurate and careful when reporting your pain level. This will help with your care. 3. Over-reporting your pain level will lead to loss of credibility. 4. Even a level of 1/10 means that there is pain and will be treated at our facility. 5. High, inaccurate reporting will be documented as "Symptom Exaggeration", leading to loss of credibility and suspicions of possible secondary gains such as obtaining more narcotics, or wanting to appear disabled, for fraudulent reasons. 6. Only pain levels of 5 or below will be seen at our facility. 7. Pain levels of 6 and above will be sent to the Emergency Department and the appointment cancelled. ____________________________________________________________________________________________   GENERAL RISKS AND COMPLICATIONS  What are the risk, side effects and possible complications? Generally speaking, most procedures are safe.  However, with any procedure there are risks, side effects, and the possibility of complications.  The risks and complications are dependent upon the sites that are lesioned, or the type of nerve block to be performed.  The closer the procedure is to the spine, the more serious the risks are.  Great care is taken when placing the radio frequency needles, block needles or lesioning probes, but sometimes complications can occur. 1. Infection: Any time there is an injection  through the skin, there is a risk of infection.  This is why sterile conditions are used for these blocks.  There are four possible types of infection. 1. Localized skin infection. 2. Central Nervous System Infection-This can be in  the form of Meningitis, which can be deadly. 3. Epidural Infections-This can be in the form of an epidural abscess, which can cause pressure inside of the spine, causing compression of the spinal cord with subsequent paralysis. This would require an emergency surgery to decompress, and there are no guarantees that the patient would recover from the paralysis. 4. Discitis-This is an infection of the intervertebral discs.  It occurs in about 1% of discography procedures.  It is difficult to treat and it may lead to surgery.        2. Pain: the needles have to go through skin and soft tissues, will cause soreness.       3. Damage to internal structures:  The nerves to be lesioned may be near blood vessels or    other nerves which can be potentially damaged.       4. Bleeding: Bleeding is more common if the patient is taking blood thinners such as  aspirin, Coumadin, Ticiid, Plavix, etc., or if he/she have some genetic predisposition  such as hemophilia. Bleeding into the spinal canal can cause compression of the spinal  cord with subsequent paralysis.  This would require an emergency surgery to  decompress and there are no guarantees that the patient would recover from the  paralysis.       5. Pneumothorax:  Puncturing of a lung is a possibility, every time a needle is introduced in  the area of the chest or upper back.  Pneumothorax refers to free air around the  collapsed lung(s), inside of the thoracic cavity (chest cavity).  Another two possible  complications related to a similar event would include: Hemothorax and Chylothorax.   These are variations of the Pneumothorax, where instead of air around the collapsed  lung(s), you may have blood or chyle, respectively.        6. Spinal headaches: They may occur with any procedures in the area of the spine.       7. Persistent CSF (Cerebro-Spinal Fluid) leakage: This is a rare problem, but may occur  with prolonged intrathecal or epidural catheters either due to the formation of a fistulous  track or a dural tear.       8. Nerve damage: By working so close to the spinal cord, there is always a possibility of  nerve damage, which could be as serious as a permanent spinal cord injury with  paralysis.       9. Death:  Although rare, severe deadly allergic reactions known as "Anaphylactic  reaction" can occur to any of the medications used.      10. Worsening of the symptoms:  We can always make thing worse.  What are the chances of something like this happening? Chances of any of this occuring are extremely low.  By statistics, you have more of a chance of getting killed in a motor vehicle accident: while driving to the hospital than any of the above occurring .  Nevertheless, you should be aware that they are possibilities.  In general, it is similar to taking a shower.  Everybody knows that you can slip, hit your head and get killed.  Does that mean that you should not shower again?  Nevertheless always keep in mind that statistics do not mean anything if you happen to be on the wrong side of them.  Even if a procedure has a 1 (one) in a 1,000,000 (million) chance of going wrong, it you happen to be that one..Also, keep in mind  that by statistics, you have more of a chance of having something go wrong when taking medications.  Who should not have this procedure? If you are on a blood thinning medication (e.g. Coumadin, Plavix, see list of "Blood Thinners"), or if you have an active infection going on, you should not have the procedure.  If you are taking any blood thinners, please inform your physician.  How should I prepare for this procedure?  Do not eat or drink anything at least six hours prior to the procedure.  Bring  a driver with you .  It cannot be a taxi.  Come accompanied by an adult that can drive you back, and that is strong enough to help you if your legs get weak or numb from the local anesthetic.  Take all of your medicines the morning of the procedure with just enough water to swallow them.  If you have diabetes, make sure that you are scheduled to have your procedure done first thing in the morning, whenever possible.  If you have diabetes, take only half of your insulin dose and notify our nurse that you have done so as soon as you arrive at the clinic.  If you are diabetic, but only take blood sugar pills (oral hypoglycemic), then do not take them on the morning of your procedure.  You may take them after you have had the procedure.  Do not take aspirin or any aspirin-containing medications, at least eleven (11) days prior to the procedure.  They may prolong bleeding.  Wear loose fitting clothing that may be easy to take off and that you would not mind if it got stained with Betadine or blood.  Do not wear any jewelry or perfume  Remove any nail coloring.  It will interfere with some of our monitoring equipment.  NOTE: Remember that this is not meant to be interpreted as a complete list of all possible complications.  Unforeseen problems may occur.  BLOOD THINNERS The following drugs contain aspirin or other products, which can cause increased bleeding during surgery and should not be taken for 2 weeks prior to and 1 week after surgery.  If you should need take something for relief of minor pain, you may take acetaminophen which is found in Tylenol,m Datril, Anacin-3 and Panadol. It is not blood thinner. The products listed below are.  Do not take any of the products listed below in addition to any listed on your instruction sheet.  A.P.C or A.P.C with Codeine Codeine Phosphate Capsules #3 Ibuprofen Ridaura  ABC compound Congesprin Imuran rimadil  Advil Cope Indocin Robaxisal   Alka-Seltzer Effervescent Pain Reliever and Antacid Coricidin or Coricidin-D  Indomethacin Rufen  Alka-Seltzer plus Cold Medicine Cosprin Ketoprofen S-A-C Tablets  Anacin Analgesic Tablets or Capsules Coumadin Korlgesic Salflex  Anacin Extra Strength Analgesic tablets or capsules CP-2 Tablets Lanoril Salicylate  Anaprox Cuprimine Capsules Levenox Salocol  Anexsia-D Dalteparin Magan Salsalate  Anodynos Darvon compound Magnesium Salicylate Sine-off  Ansaid Dasin Capsules Magsal Sodium Salicylate  Anturane Depen Capsules Marnal Soma  APF Arthritis pain formula Dewitt's Pills Measurin Stanback  Argesic Dia-Gesic Meclofenamic Sulfinpyrazone  Arthritis Bayer Timed Release Aspirin Diclofenac Meclomen Sulindac  Arthritis pain formula Anacin Dicumarol Medipren Supac  Analgesic (Safety coated) Arthralgen Diffunasal Mefanamic Suprofen  Arthritis Strength Bufferin Dihydrocodeine Mepro Compound Suprol  Arthropan liquid Dopirydamole Methcarbomol with Aspirin Synalgos  ASA tablets/Enseals Disalcid Micrainin Tagament  Ascriptin Doan's Midol Talwin  Ascriptin A/D Dolene Mobidin Tanderil  Ascriptin Extra Strength Dolobid Moblgesic Ticlid  Ascriptin with Codeine Doloprin or Doloprin with Codeine Momentum Tolectin  Asperbuf Duoprin Mono-gesic Trendar  Aspergum Duradyne Motrin or Motrin IB Triminicin  Aspirin plain, buffered or enteric coated Durasal Myochrisine Trigesic  Aspirin Suppositories Easprin Nalfon Trillsate  Aspirin with Codeine Ecotrin Regular or Extra Strength Naprosyn Uracel  Atromid-S Efficin Naproxen Ursinus  Auranofin Capsules Elmiron Neocylate Vanquish  Axotal Emagrin Norgesic Verin  Azathioprine Empirin or Empirin with Codeine Normiflo Vitamin E  Azolid Emprazil Nuprin Voltaren  Bayer Aspirin plain, buffered or children's or timed BC Tablets or powders Encaprin Orgaran Warfarin Sodium  Buff-a-Comp Enoxaparin Orudis Zorpin  Buff-a-Comp with Codeine Equegesic Os-Cal-Gesic   Buffaprin  Excedrin plain, buffered or Extra Strength Oxalid   Bufferin Arthritis Strength Feldene Oxphenbutazone   Bufferin plain or Extra Strength Feldene Capsules Oxycodone with Aspirin   Bufferin with Codeine Fenoprofen Fenoprofen Pabalate or Pabalate-SF   Buffets II Flogesic Panagesic   Buffinol plain or Extra Strength Florinal or Florinal with Codeine Panwarfarin   Buf-Tabs Flurbiprofen Penicillamine   Butalbital Compound Four-way cold tablets Penicillin   Butazolidin Fragmin Pepto-Bismol   Carbenicillin Geminisyn Percodan   Carna Arthritis Reliever Geopen Persantine   Carprofen Gold's salt Persistin   Chloramphenicol Goody's Phenylbutazone   Chloromycetin Haltrain Piroxlcam   Clmetidine heparin Plaquenil   Cllnoril Hyco-pap Ponstel   Clofibrate Hydroxy chloroquine Propoxyphen         Before stopping any of these medications, be sure to consult the physician who ordered them.  Some, such as Coumadin (Warfarin) are ordered to prevent or treat serious conditions such as "deep thrombosis", "pumonary embolisms", and other heart problems.  The amount of time that you may need off of the medication may also vary with the medication and the reason for which you were taking it.  If you are taking any of these medications, please make sure you notify your pain physician before you undergo any procedures.          Facet Joint Block The facet joints connect the bones of the spine (vertebrae). They make it possible for you to bend, twist, and make other movements with your spine. They also keep you from bending too far, twisting too far, and making other excessive movements. A facet joint block is a procedure where a numbing medicine (anesthetic) is injected into a facet joint. Often, a type of anti-inflammatory medicine called a steroid is also injected. A facet joint block may be done to diagnose neck or back pain. If the pain gets better after a facet joint block, it means the pain is probably  coming from the facet joint. If the pain does not get better, it means the pain is probably not coming from the facet joint. A facet joint block may also be done to relieve neck or back pain caused by an inflamed facet joint. A facet joint block is only done to relieve pain if the pain does not improve with other methods, such as medicine, exercise programs, and physical therapy. Tell a health care provider about:  Any allergies you have.  All medicines you are taking, including vitamins, herbs, eye drops, creams, and over-the-counter medicines.  Any problems you or family members have had with anesthetic medicines.  Any blood disorders you have.  Any surgeries you have had.  Any medical conditions you have.  Whether you are pregnant or may be pregnant. What are the risks? Generally, this is a safe procedure. However, problems may occur, including:  Bleeding.  Injury to a nerve near  the injection site.  Pain at the injection site.  Weakness or numbness in areas controlled by nerves near the injection site.  Infection.  Temporary fluid retention.  Allergic reactions to medicines or dyes.  Injury to other structures or organs near the injection site.  What happens before the procedure?  Follow instructions from your health care provider about eating or drinking restrictions.  Ask your health care provider about: ? Changing or stopping your regular medicines. This is especially important if you are taking diabetes medicines or blood thinners. ? Taking medicines such as aspirin and ibuprofen. These medicines can thin your blood. Do not take these medicines before your procedure if your health care provider instructs you not to.  Do not take any new dietary supplements or medicines without asking your health care provider first.  Plan to have someone take you home after the procedure. What happens during the procedure?  You may need to remove your clothing and dress in an  open-back gown.  The procedure will be done while you are lying on an X-ray table. You will most likely be asked to lie on your stomach, but you may be asked to lie in a different position if an injection will be made in your neck.  Machines will be used to monitor your oxygen levels, heart rate, and blood pressure.  If an injection will be made in your neck, an IV tube will be inserted into one of your veins. Fluids and medicine will flow directly into your body through the IV tube.  The area over the facet joint where the injection will be made will be cleaned with soap. The surrounding skin will be covered with clean drapes.  A numbing medicine (local anesthetic) will be applied to your skin. Your skin may sting or burn for a moment.  A video X-ray machine (fluoroscopy) will be used to locate the joint. In some cases, a CT scan may be used.  A contrast dye may be injected into the facet joint area to help locate the joint.  When the joint is located, an anesthetic will be injected into the joint through the needle.  Your health care provider will ask you whether you feel pain relief. If you do feel relief, a steroid may be injected to provide pain relief for a longer period of time. If you do not feel relief or feel only partial relief, additional injections of an anesthetic may be made in other facet joints.  The needle will be removed.  Your skin will be cleaned.  A bandage (dressing) will be applied over each injection site. The procedure may vary among health care providers and hospitals. What happens after the procedure?  You will be observed for 15-30 minutes before being allowed to go home. This information is not intended to replace advice given to you by your health care provider. Make sure you discuss any questions you have with your health care provider. Document Released: 05/11/2006 Document Revised: 01/21/2015 Document Reviewed: 09/15/2014 Elsevier Interactive Patient  Education  2018 [place].  Epidural Steroid Injection An epidural steroid injection is a shot of steroid medicine and numbing medicine that is given into the space between the spinal cord and the bones in your back (epidural space). The shot helps relieve pain caused by an irritated or swollen nerve root. The amount of pain relief you get from the injection depends on what is causing the nerve to be swollen and irritated, and how long your pain lasts.  You are more likely to benefit from this injection if your pain is strong and comes on suddenly rather than if you have had pain for a long time. Tell a health care provider about:  Any allergies you have.  All medicines you are taking, including vitamins, herbs, eye drops, creams, and over-the-counter medicines.  Any problems you or family members have had with anesthetic medicines.  Any blood disorders you have.  Any surgeries you have had.  Any medical conditions you have.  Whether you are pregnant or may be pregnant. What are the risks? Generally, this is a safe procedure. However, problems may occur, including:  Headache.  Bleeding.  Infection.  Allergic reaction to medicines.  Damage to your nerves.  What happens before the procedure? Staying hydrated Follow instructions from your health care provider about hydration, which may include:  Up to 2 hours before the procedure - you may continue to drink clear liquids, such as water, clear fruit juice, black coffee, and plain tea.  Eating and drinking restrictions Follow instructions from your health care provider about eating and drinking, which may include:  8 hours before the procedure - stop eating heavy meals or foods such as meat, fried foods, or fatty foods.  6 hours before the procedure - stop eating light meals or foods, such as toast or cereal.  6 hours before the procedure - stop drinking milk or drinks that contain milk.  2 hours before the procedure -  stop drinking clear liquids.  Medicine  You may be given medicines to lower anxiety.  Ask your health care provider about: ? Changing or stopping your regular medicines. This is especially important if you are taking diabetes medicines or blood thinners. ? Taking medicines such as aspirin and ibuprofen. These medicines can thin your blood. Do not take these medicines before your procedure if your health care provider instructs you not to. General instructions  Plan to have someone take you home from the hospital or clinic. What happens during the procedure?  You may receive a medicine to help you relax (sedative).  You will be asked to lie on your abdomen.  The injection site will be cleaned.  A numbing medicine (local anesthetic) will be used to numb the injection site.  A needle will be inserted through your skin into the epidural space. You may feel some discomfort when this happens. An X-ray machine will be used to make sure the needle is put as close as possible to the affected nerve.  A steroid medicine and a local anesthetic will be injected into the epidural space.  The needle will be removed.  A bandage (dressing) will be put over the injection site. What happens after the procedure?  Your blood pressure, heart rate, breathing rate, and blood oxygen level will be monitored until the medicines you were given have worn off.  Your arm or leg may feel weak or numb for a few hours.  The injection site may feel sore.  Do not drive for 24 hours if you received a sedative. This information is not intended to replace advice given to you by your health care provider. Make sure you discuss any questions you have with your health care provider. Document Released: 03/29/2007 Document Revised: 06/03/2015 Document Reviewed: 04/07/2015 Elsevier Interactive Patient Education  2017 [employer].

## 2016-12-05 ENCOUNTER — Other Ambulatory Visit: Payer: Self-pay | Admitting: Pain Medicine

## 2016-12-05 DIAGNOSIS — E538 Deficiency of other specified B group vitamins: Secondary | ICD-10-CM

## 2016-12-06 ENCOUNTER — Ambulatory Visit: Payer: Medicaid Other | Admitting: Pain Medicine

## 2016-12-06 ENCOUNTER — Other Ambulatory Visit: Payer: Self-pay

## 2016-12-06 ENCOUNTER — Ambulatory Visit
Admission: RE | Admit: 2016-12-06 | Discharge: 2016-12-06 | Disposition: A | Discharge status: Home / Self Care | Payer: Medicaid Other | Source: Ambulatory Visit | Attending: Pain Medicine | Admitting: Pain Medicine

## 2016-12-06 ENCOUNTER — Encounter: Payer: Self-pay | Admitting: Pain Medicine

## 2016-12-06 VITALS — BP 110/61 | HR 94 | Temp 98.6°F | Resp 13 | Ht 62.0 in | Wt 260.0 lb

## 2016-12-06 DIAGNOSIS — Z79899 Other long term (current) drug therapy: Secondary | ICD-10-CM | POA: Insufficient documentation

## 2016-12-06 DIAGNOSIS — M488X6 Other specified spondylopathies, lumbar region: Secondary | ICD-10-CM | POA: Diagnosis not present

## 2016-12-06 DIAGNOSIS — M47816 Spondylosis without myelopathy or radiculopathy, lumbar region: Secondary | ICD-10-CM

## 2016-12-06 DIAGNOSIS — Z794 Long term (current) use of insulin: Secondary | ICD-10-CM | POA: Insufficient documentation

## 2016-12-06 DIAGNOSIS — E119 Type 2 diabetes mellitus without complications: Secondary | ICD-10-CM | POA: Diagnosis not present

## 2016-12-06 DIAGNOSIS — Z6841 Body Mass Index (BMI) 40.0 and over, adult: Secondary | ICD-10-CM | POA: Insufficient documentation

## 2016-12-06 DIAGNOSIS — M5442 Lumbago with sciatica, left side: Secondary | ICD-10-CM

## 2016-12-06 DIAGNOSIS — G8929 Other chronic pain: Secondary | ICD-10-CM | POA: Insufficient documentation

## 2016-12-06 DIAGNOSIS — Z90710 Acquired absence of both cervix and uterus: Secondary | ICD-10-CM | POA: Diagnosis not present

## 2016-12-06 DIAGNOSIS — M5441 Lumbago with sciatica, right side: Secondary | ICD-10-CM | POA: Diagnosis not present

## 2016-12-06 DIAGNOSIS — M150 Primary generalized (osteo)arthritis: Secondary | ICD-10-CM

## 2016-12-06 DIAGNOSIS — M545 Low back pain: Secondary | ICD-10-CM | POA: Insufficient documentation

## 2016-12-06 DIAGNOSIS — Z9049 Acquired absence of other specified parts of digestive tract: Secondary | ICD-10-CM | POA: Insufficient documentation

## 2016-12-06 DIAGNOSIS — Z7951 Long term (current) use of inhaled steroids: Secondary | ICD-10-CM | POA: Insufficient documentation

## 2016-12-06 DIAGNOSIS — Z885 Allergy status to narcotic agent status: Secondary | ICD-10-CM | POA: Diagnosis not present

## 2016-12-06 DIAGNOSIS — M8949 Other hypertrophic osteoarthropathy, multiple sites: Secondary | ICD-10-CM

## 2016-12-06 DIAGNOSIS — M159 Polyosteoarthritis, unspecified: Secondary | ICD-10-CM

## 2016-12-06 MED ORDER — MIDAZOLAM HCL 5 MG/5ML IJ SOLN
1.0000 mg | INTRAMUSCULAR | Status: DC | PRN
  Administered 2016-12-06: 3 mg via INTRAVENOUS

## 2016-12-06 MED ORDER — FENTANYL CITRATE (PF) 100 MCG/2ML IJ SOLN
INTRAMUSCULAR | Status: AC
  Filled 2016-12-06: qty 2

## 2016-12-06 MED ORDER — ROPIVACAINE HCL 2 MG/ML IJ SOLN
9.0000 mL | Freq: Once | INTRAMUSCULAR | Status: AC
Start: 2016-12-06 — End: 2016-12-06
  Administered 2016-12-06: 9 mL via PERINEURAL

## 2016-12-06 MED ORDER — LIDOCAINE HCL 2 % IJ SOLN
INTRAMUSCULAR | Status: AC
  Filled 2016-12-06: qty 20

## 2016-12-06 MED ORDER — ROPIVACAINE HCL 2 MG/ML IJ SOLN
9.0000 mL | Freq: Once | INTRAMUSCULAR | Status: AC
  Administered 2016-12-06: 9 mL via PERINEURAL

## 2016-12-06 MED ORDER — TRIAMCINOLONE ACETONIDE 40 MG/ML IJ SUSP
40.0000 mg | Freq: Once | INTRAMUSCULAR | Status: AC
Start: 2016-12-06 — End: 2016-12-06
  Administered 2016-12-06: 40 mg

## 2016-12-06 MED ORDER — FENTANYL CITRATE (PF) 100 MCG/2ML IJ SOLN
25.0000 ug | INTRAMUSCULAR | Status: DC | PRN
  Administered 2016-12-06: 100 ug via INTRAVENOUS

## 2016-12-06 MED ORDER — LIDOCAINE HCL 2 % IJ SOLN
10.0000 mL | Freq: Once | INTRAMUSCULAR | Status: AC
  Administered 2016-12-06: 400 mg

## 2016-12-06 MED ORDER — DICLOFENAC SODIUM 1 % TD GEL: 4.0000 g | Freq: Three times a day (TID) | TRANSDERMAL | 5 refills | Status: AC | PRN

## 2016-12-06 MED ORDER — TRIAMCINOLONE ACETONIDE 40 MG/ML IJ SUSP
INTRAMUSCULAR | Status: AC
  Filled 2016-12-06: qty 2

## 2016-12-06 MED ORDER — ROPIVACAINE HCL 2 MG/ML IJ SOLN
INTRAMUSCULAR | Status: AC
  Filled 2016-12-06: qty 20

## 2016-12-06 MED ORDER — LACTATED RINGERS IV SOLN
1000.0000 mL | Freq: Once | INTRAVENOUS | Status: AC
  Administered 2016-12-06: 1000 mL via INTRAVENOUS

## 2016-12-06 MED ORDER — MIDAZOLAM HCL 5 MG/5ML IJ SOLN
INTRAMUSCULAR | Status: AC
  Filled 2016-12-06: qty 5

## 2016-12-06 MED ORDER — TRIAMCINOLONE ACETONIDE 40 MG/ML IJ SUSP
40.0000 mg | Freq: Once | INTRAMUSCULAR | Status: AC
  Administered 2016-12-06: 40 mg

## 2016-12-06 NOTE — Patient Instructions (Signed)

## 2016-12-06 NOTE — Progress Notes (Signed)
Patient's Name: Lori Mckenzie  MRN: [id number]  Referring Provider: Ardine Eng, MD  DOB: 22-May-1965  PCP: Ardine Eng, MD  DOS: 12/06/2016  Note by: Gaspar Cola, MD  Service setting: Ambulatory outpatient  Specialty: Interventional Pain Management  Patient type: Established  Location: [facility] (AMB) Pain Management Facility  Visit type: Interventional Procedure   Primary Reason for Visit: Interventional Pain Management Treatment. CC: Back Pain (middle to low)  Procedure:  Anesthesia, Analgesia, Anxiolysis:  Type: Diagnostic Medial Branch Facet Block Region: Lumbar Level: L2, L3, L4, L5, & S1 Medial Branch Level(s) Laterality: Bilateral  Type: Local Anesthesia with Moderate (Conscious) Sedation Local Anesthetic: Lidocaine 1% Route: Intravenous (IV) IV Access: Secured Sedation: Meaningful verbal contact was maintained at all times during the procedure  Indication(s): Analgesia and Anxiety   Indications: 1. Chronic low back pain (Primary Source of Pain) (Bilateral) (R>L)   2. Lumbar facet syndrome (Bilateral) (R>L)   3. Lumbar spondylosis   4. Osteoarthritis    Pain Score: Pre-procedure: 4 /10 Post-procedure: 0-No pain/10  Pre-op Assessment:  Lori Mckenzie is a 51 y.o. (year old), female patient, seen today for interventional treatment. She  has a past surgical history that includes Abdominal hysterectomy; Tubal ligation; Colonoscopy with propofol (N/A, 06/23/2014); Esophagogastroduodenoscopy (N/A, 06/23/2014); Cholecystectomy (N/A, 07/15/2014); ERCP (N/A, 07/16/2014); Wisdom tooth extraction; ERCP (N/A, 10/03/2014); Esophagogastroduodenoscopy (egd) with propofol (N/A, 12/01/2015); and Esophagogastroduodenoscopy (N/A, 06/30/2016). Lori Mckenzie has a current medication list which includes the following prescription(s): albuterol, cholecalciferol, citalopram, diclofenac sodium, dicyclomine, fluticasone, intrinsi b12-folate, furosemide, gabapentin, insulin detemir, ipratropium-albuterol, lactulose,  nystatin, pantoprazole, potassium chloride sa, promethazine, rifaximin, rizatriptan, spironolactone, and vitamin b-12, and the following Facility-Administered Medications: fentanyl and midazolam. Her primarily concern today is the Back Pain (middle to low)  The patient knows that she is at high risk for hyperglycemia and hospitalizations secondary to her diabetic problems.  She has pointed out that she has coordinated with her primary care physician to manage the hyper glycemia after steroid injections with her insulin and to keep an eye on it.  Unfortunately, she is not a good candidate for opiate therapy due to use of illicit substances (cocaine).  In an attempt to control her low back pain and minimize the use of steroids, we have gone to using radiofrequency.  We recently completed a radiofrequency of the left lumbar facets, however, she has started having some new symptoms with lower extremity pain that may be radicular.  However, her primary pain today is the lower back.  She is having a considerable amount of problems with osteoarthritis of the lumbar spine due to her morbid obesity.  Several times we have talked about bringing her weight down to a goal of a BMI of 30 or below.  She refers that since her last visit here she has lost 10 pounds.  She is still morbidly obese and clearly this is affecting her lower back pain.  Initial Vital Signs: Blood pressure 128/87, pulse 94, temperature 98.6 F (37 C), height 5' 2"  (1.575 m), weight 260 lb (117.9 kg), SpO2 98 %. BMI: Estimated body mass index is 47.55 kg/m as calculated from the following:   Height as of this encounter: 5' 2"  (1.575 m).   Weight as of this encounter: 260 lb (117.9 kg).  Risk Assessment: Allergies: Reviewed. She is allergic to tape and vicodin [hydrocodone-acetaminophen].  Allergy Precautions: None required Coagulopathies: Reviewed. None identified.  Blood-thinner therapy: None at this time Active Infection(s): Reviewed. None  identified. Lori Mckenzie is afebrile  Site Confirmation: Lori Mckenzie was asked to confirm the procedure and laterality before marking the site Procedure checklist: Completed Consent: Before the procedure and under the influence of no sedative(s), amnesic(s), or anxiolytics, the patient was informed of the treatment options, risks and possible complications. To fulfill our ethical and legal obligations, as recommended by the American Medical Association's Code of Ethics, I have informed the patient of my clinical impression; the nature and purpose of the treatment or procedure; the risks, benefits, and possible complications of the intervention; the alternatives, including doing nothing; the risk(s) and benefit(s) of the alternative treatment(s) or procedure(s); and the risk(s) and benefit(s) of doing nothing. The patient was provided information about the general risks and possible complications associated with the procedure. These may include, but are not limited to: failure to achieve desired goals, infection, bleeding, organ or nerve damage, allergic reactions, paralysis, and death. In addition, the patient was informed of those risks and complications associated to Spine-related procedures, such as failure to decrease pain; infection (i.e.: Meningitis, epidural or intraspinal abscess); bleeding (i.e.: epidural hematoma, subarachnoid hemorrhage, or any other type of intraspinal or peri-dural bleeding); organ or nerve damage (i.e.: Any type of peripheral nerve, nerve root, or spinal cord injury) with subsequent damage to sensory, motor, and/or autonomic systems, resulting in permanent pain, numbness, and/or weakness of one or several areas of the body; allergic reactions; (i.e.: anaphylactic reaction); and/or death. Furthermore, the patient was informed of those risks and complications associated with the medications. These include, but are not limited to: allergic reactions (i.e.: anaphylactic or anaphylactoid  reaction(s)); adrenal axis suppression; blood sugar elevation that in diabetics may result in ketoacidosis or comma; water retention that in patients with history of congestive heart failure may result in shortness of breath, pulmonary edema, and decompensation with resultant heart failure; weight gain; swelling or edema; medication-induced neural toxicity; particulate matter embolism and blood vessel occlusion with resultant organ, and/or nervous system infarction; and/or aseptic necrosis of one or more joints. Finally, the patient was informed that Medicine is not an exact science; therefore, there is also the possibility of unforeseen or unpredictable risks and/or possible complications that may result in a catastrophic outcome. The patient indicated having understood very clearly. We have given the patient no guarantees and we have made no promises. Enough time was given to the patient to ask questions, all of which were answered to the patient's satisfaction. Lori Mckenzie has indicated that she wanted to continue with the procedure. Attestation: I, the ordering provider, attest that I have discussed with the patient the benefits, risks, side-effects, alternatives, likelihood of achieving goals, and potential problems during recovery for the procedure that I have provided informed consent. Date: 12/06/2016; Time: 10:26 AM  Pre-Procedure Preparation:  Monitoring: As per clinic protocol. Respiration, ETCO2, SpO2, BP, heart rate and rhythm monitor placed and checked for adequate function Safety Precautions: Patient was assessed for positional comfort and pressure points before starting the procedure. Time-out: I initiated and conducted the "Time-out" before starting the procedure, as per protocol. The patient was asked to participate by confirming the accuracy of the "Time Out" information. Verification of the correct person, site, and procedure were performed and confirmed by me, the nursing staff, and the  patient. "Time-out" conducted as per Joint Commission's Universal Protocol (UP.01.01.01). "Time-out" Date & Time: 12/06/2016; 1031 hrs.  Description of Procedure Process:   Position: Prone Target Area: For Lumbar Facet blocks, the target is the groove formed by the junction of the transverse process and  superior articular process. For the L5 dorsal ramus, the target is the notch between superior articular process and sacral ala. For the S1 dorsal ramus, the target is the superior and lateral edge of the posterior S1 Sacral foramen. Approach: Paramedial approach. Area Prepped: Entire Posterior Lumbosacral Region Prepping solution: ChloraPrep (2% chlorhexidine gluconate and 70% isopropyl alcohol) Safety Precautions: Aspiration looking for blood return was conducted prior to all injections. At no point did we inject any substances, as a needle was being advanced. No attempts were made at seeking any paresthesias. Safe injection practices and needle disposal techniques used. Medications properly checked for expiration dates. SDV (single dose vial) medications used. Description of the Procedure: Protocol guidelines were followed. The patient was placed in position over the fluoroscopy table. The target area was identified and the area prepped in the usual manner. Skin desensitized using vapocoolant spray. Skin & deeper tissues infiltrated with local anesthetic. Appropriate amount of time allowed to pass for local anesthetics to take effect. The procedure needle was introduced through the skin, ipsilateral to the reported pain, and advanced to the target area. Employing the "Medial Branch Technique", the needles were advanced to the angle made by the superior and medial portion of the transverse process, and the lateral and inferior portion of the superior articulating process of the targeted vertebral bodies. This area is known as "Burton's Eye" or the "Eye of the [place] Dog". A procedure needle was introduced  through the skin, and this time advanced to the angle made by the superior and medial border of the sacral ala, and the lateral border of the S1 vertebral body. This last needle was later repositioned at the superior and lateral border of the posterior S1 foramen. Negative aspiration confirmed. Solution injected in intermittent fashion, asking for systemic symptoms every 0.5cc of injectate. The needles were then removed and the area cleansed, making sure to leave some of the prepping solution back to take advantage of its long term bactericidal properties.   Illustration of the posterior view of the lumbar spine and the posterior neural structures. Laminae of L2 through S1 are labeled. DPRL5, dorsal primary ramus of L5; DPRS1, dorsal primary ramus of S1; DPR3, dorsal primary ramus of L3; FJ, facet (zygapophyseal) joint L3-L4; I, inferior articular process of L4; LB1, lateral branch of dorsal primary ramus of L1; IAB, inferior articular branches from L3 medial branch (supplies L4-L5 facet joint); IBP, intermediate branch plexus; MB3, medial branch of dorsal primary ramus of L3; NR3, third lumbar nerve root; S, superior articular process of L5; SAB, superior articular branches from L4 (supplies L4-5 facet joint also); TP3, transverse process of L3.  Vitals:   12/06/16 1042 12/06/16 1050 12/06/16 1100 12/06/16 1109  BP: (!) 118/103 (!) 105/56 (!) 108/54 110/61  Pulse:      Resp: 20 18 12 13   Temp:      SpO2: 95% 94% 96% 96%  Weight:      Height:        Start Time: 1031 hrs. End Time: 1041 hrs. Materials:  Needle(s) Type: Regular needle Gauge: 22G Length: 3.5-in Medication(s): We administered lactated ringers, midazolam, fentaNYL, lidocaine, triamcinolone acetonide, ropivacaine (PF) 2 mg/mL (0.2%), triamcinolone acetonide, and ropivacaine (PF) 2 mg/mL (0.2%). Please see chart orders for dosing details.  Imaging Guidance (Spinal):  Type of Imaging Technique: Fluoroscopy Guidance  (Spinal) Indication(s): Assistance in needle guidance and placement for procedures requiring needle placement in or near specific anatomical locations not easily accessible without such assistance. Exposure Time: Please  see nurses notes. Contrast: None used. Fluoroscopic Guidance: I was personally present during the use of fluoroscopy. "Tunnel Vision Technique" used to obtain the best possible view of the target area. Parallax error corrected before commencing the procedure. "Direction-depth-direction" technique used to introduce the needle under continuous pulsed fluoroscopy. Once target was reached, antero-posterior, oblique, and lateral fluoroscopic projection used confirm needle placement in all planes. Images permanently stored in EMR. Interpretation: No contrast injected. I personally interpreted the imaging intraoperatively. Adequate needle placement confirmed in multiple planes. Permanent images saved into the patient's record.  Antibiotic Prophylaxis:  Indication(s): None identified Antibiotic given: None  Post-operative Assessment:  EBL: None Complications: No immediate post-treatment complications observed by team, or reported by patient. Note: The patient tolerated the entire procedure well. A repeat set of vitals were taken after the procedure and the patient was kept under observation following institutional policy, for this type of procedure. Post-procedural neurological assessment was performed, showing return to baseline, prior to discharge. The patient was provided with post-procedure discharge instructions, including a section on how to identify potential problems. Should any problems arise concerning this procedure, the patient was given instructions to immediately contact us, at any time, without hesitation. In any case, we plan to contact the patient by telephone for a follow-up status report regarding this interventional procedure. Comments:  No additional relevant  information.  Plan of Care   Imaging Orders     DG C-Arm 1-60 Min-No Report  Procedure Orders     LUMBAR FACET(MEDIAL BRANCH NERVE BLOCK) MBNB  Medications ordered for procedure: Meds ordered this encounter  Medications  . lactated ringers infusion 1,000 mL  . midazolam (VERSED) 5 MG/5ML injection 1-2 mg    Make sure Flumazenil is available in the pyxis when using this medication. If oversedation occurs, administer 0.2 mg IV over 15 sec. If after 45 sec no response, administer 0.2 mg again over 1 min; may repeat at 1 min intervals; not to exceed 4 doses (1 mg)  . fentaNYL (SUBLIMAZE) injection 25-50 mcg    Make sure Narcan is available in the pyxis when using this medication. In the event of respiratory depression (RR< 8/min): Titrate NARCAN (naloxone) in increments of 0.1 to 0.2 mg IV at 2-3 minute intervals, until desired degree of reversal.  . lidocaine (XYLOCAINE) 2 % (with pres) injection 200 mg  . triamcinolone acetonide (KENALOG-40) injection 40 mg  . ropivacaine (PF) 2 mg/mL (0.2%) (NAROPIN) injection 9 mL  . triamcinolone acetonide (KENALOG-40) injection 40 mg  . ropivacaine (PF) 2 mg/mL (0.2%) (NAROPIN) injection 9 mL  . diclofenac sodium (VOLTAREN) 1 % GEL    Sig: Apply 4 g topically every 8 (eight) hours as needed.    Dispense:  100 g    Refill:  5    Do not add this medication to the electronic "Automatic Refill" notification system. Patient may have prescription filled one day early if pharmacy is closed on scheduled refill date.   Medications administered: We administered lactated ringers, midazolam, fentaNYL, lidocaine, triamcinolone acetonide, ropivacaine (PF) 2 mg/mL (0.2%), triamcinolone acetonide, and ropivacaine (PF) 2 mg/mL (0.2%).  See the medical record for exact dosing, route, and time of administration.  This SmartLink is deprecated. Use AVSMEDLIST instead to display the medication list for a patient. Disposition: Discharge home  Discharge Date & Time:  12/06/2016; 1110 hrs.   Physician-requested Follow-up: Return for post-procedure eval by Dr. Dossie Arbour in 2 wks. Future Appointments  Date Time Provider [place]  12/21/2016 10:15 AM Dossie Arbour,  Beatriz Chancellor, [place] None   Primary Care Physician: Ardine Eng, MD Location: [facility] Note by: Gaspar Cola, MD Date: 12/06/2016; Time: 11:46 AM  Disclaimer:  Medicine is not an Chief Strategy Officer. The only guarantee in medicine is that nothing is guaranteed. It is important to note that the decision to proceed with this intervention was based on the information collected from the patient. The Data and conclusions were drawn from the patient's questionnaire, the interview, and the physical examination. Because the information was provided in large part by the patient, it cannot be guaranteed that it has not been purposely or unconsciously manipulated. Every effort has been made to obtain as much relevant data as possible for this evaluation. It is important to note that the conclusions that lead to this procedure are derived in large part from the available data. Always take into account that the treatment will also be dependent on availability of resources and existing treatment guidelines, considered by other Pain Management Practitioners as being common knowledge and practice, at the time of the intervention. For Medico-Legal purposes, it is also important to point out that variation in procedural techniques and pharmacological choices are the acceptable norm. The indications, contraindications, technique, and results of the above procedure should only be interpreted and judged by a Board-Certified Interventional Pain Specialist with extensive familiarity and expertise in the same exact procedure and technique.

## 2016-12-06 NOTE — Progress Notes (Signed)
Patient states BS was 263, Patient tatking her Levemir.

## 2016-12-07 ENCOUNTER — Telehealth: Payer: Self-pay | Admitting: *Deleted

## 2016-12-07 NOTE — Telephone Encounter (Signed)
Attempted to calll patient for post procedure follow-up. The outgoing message was that of someone else. No message left.

## 2016-12-15 ENCOUNTER — Emergency Department
Admission: EM | Admit: 2016-12-15 | Discharge: 2016-12-16 | Disposition: A | Discharge status: Home / Self Care | Payer: Medicaid Other | Attending: Emergency Medicine | Admitting: Emergency Medicine

## 2016-12-15 ENCOUNTER — Emergency Department: Payer: Medicaid Other

## 2016-12-15 ENCOUNTER — Other Ambulatory Visit: Payer: Self-pay

## 2016-12-15 DIAGNOSIS — E1122 Type 2 diabetes mellitus with diabetic chronic kidney disease: Secondary | ICD-10-CM | POA: Diagnosis not present

## 2016-12-15 DIAGNOSIS — Z794 Long term (current) use of insulin: Secondary | ICD-10-CM | POA: Insufficient documentation

## 2016-12-15 DIAGNOSIS — M5442 Lumbago with sciatica, left side: Secondary | ICD-10-CM | POA: Diagnosis not present

## 2016-12-15 DIAGNOSIS — J449 Chronic obstructive pulmonary disease, unspecified: Secondary | ICD-10-CM | POA: Insufficient documentation

## 2016-12-15 DIAGNOSIS — E039 Hypothyroidism, unspecified: Secondary | ICD-10-CM | POA: Diagnosis not present

## 2016-12-15 DIAGNOSIS — Z79899 Other long term (current) drug therapy: Secondary | ICD-10-CM | POA: Insufficient documentation

## 2016-12-15 DIAGNOSIS — I129 Hypertensive chronic kidney disease with stage 1 through stage 4 chronic kidney disease, or unspecified chronic kidney disease: Secondary | ICD-10-CM | POA: Diagnosis not present

## 2016-12-15 DIAGNOSIS — N189 Chronic kidney disease, unspecified: Secondary | ICD-10-CM | POA: Insufficient documentation

## 2016-12-15 DIAGNOSIS — Z791 Long term (current) use of non-steroidal anti-inflammatories (NSAID): Secondary | ICD-10-CM | POA: Diagnosis not present

## 2016-12-15 DIAGNOSIS — R079 Chest pain, unspecified: Secondary | ICD-10-CM | POA: Diagnosis present

## 2016-12-15 DIAGNOSIS — Z8673 Personal history of transient ischemic attack (TIA), and cerebral infarction without residual deficits: Secondary | ICD-10-CM | POA: Insufficient documentation

## 2016-12-15 DIAGNOSIS — G8929 Other chronic pain: Secondary | ICD-10-CM | POA: Diagnosis not present

## 2016-12-15 DIAGNOSIS — J45909 Unspecified asthma, uncomplicated: Secondary | ICD-10-CM | POA: Diagnosis not present

## 2016-12-15 DIAGNOSIS — M5441 Lumbago with sciatica, right side: Secondary | ICD-10-CM | POA: Insufficient documentation

## 2016-12-15 DIAGNOSIS — M5431 Sciatica, right side: Secondary | ICD-10-CM

## 2016-12-15 DIAGNOSIS — M5432 Sciatica, left side: Secondary | ICD-10-CM

## 2016-12-15 LAB — BASIC METABOLIC PANEL
Anion gap: 8 (ref 5–15)
BUN: 14 mg/dL (ref 6–20)
CO2: 23 mmol/L (ref 22–32)
Calcium: 9.3 mg/dL (ref 8.9–10.3)
Chloride: 103 mmol/L (ref 101–111)
Creatinine, Ser: 0.94 mg/dL (ref 0.44–1.00)
GFR calc Af Amer: >60 mL/min (ref >60)
GFR calc non Af Amer: >60 mL/min (ref >60)
Glucose, Bld: 478 mg/dL — ABNORMAL HIGH (ref 65–99)
Potassium: 4.1 mmol/L (ref 3.5–5.1)
Sodium: 134 mmol/L — ABNORMAL LOW (ref 135–145)

## 2016-12-15 LAB — CBC
HCT: 46.6 % (ref 35.0–47.0)
Hemoglobin: 15.9 g/dL (ref 12.0–16.0)
MCH: 34.2 pg — ABNORMAL HIGH (ref 26.0–34.0)
MCHC: 34.1 g/dL (ref 32.0–36.0)
MCV: 100.3 fL — ABNORMAL HIGH (ref 80.0–100.0)
Platelets: 75 10*3/uL — ABNORMAL LOW (ref 150–440)
RBC: 4.65 MIL/uL (ref 3.80–5.20)
RDW: 13.7 % (ref 11.5–14.5)
WBC: 7.4 10*3/uL (ref 3.6–11.0)

## 2016-12-15 LAB — GLUCOSE, CAPILLARY: Glucose-Capillary: 486 mg/dL — ABNORMAL HIGH (ref 65–99)

## 2016-12-15 LAB — TROPONIN I: Troponin I: <0.03 ng/mL (ref <0.03)

## 2016-12-15 NOTE — ED Triage Notes (Signed)
Pt arrives to ED via POV from home with c/o Chest pain and blurry vision. Pt reports CP started x1 hr PTA, pt reports blurry vision started x30 minutes PTA. Pt reports left-sided chest pain with radiation into her shoulder blades. Pt reports (+) nausea, but denies emesis. Pt is alert, but seems under the influence and keeps nodding off during Triage assessment.

## 2016-12-16 LAB — GLUCOSE, CAPILLARY
Glucose-Capillary: 258 mg/dL — ABNORMAL HIGH (ref 65–99)
Glucose-Capillary: 331 mg/dL — ABNORMAL HIGH (ref 65–99)

## 2016-12-16 LAB — URINALYSIS, COMPLETE (UACMP) WITH MICROSCOPIC
Bacteria, UA: NONE SEEN
Bilirubin Urine: NEGATIVE
Glucose, UA: >=500 mg/dL — AB
Hgb urine dipstick: NEGATIVE
Ketones, ur: 5 mg/dL — AB
Leukocytes, UA: NEGATIVE
Nitrite: NEGATIVE
Protein, ur: NEGATIVE mg/dL
Specific Gravity, Urine: 1.043 — ABNORMAL HIGH (ref 1.005–1.030)
pH: 5 (ref 5.0–8.0)

## 2016-12-16 LAB — URINE DRUG SCREEN, QUALITATIVE (ARMC ONLY)
Amphetamines, Ur Screen: NOT DETECTED
Barbiturates, Ur Screen: NOT DETECTED
Benzodiazepine, Ur Scrn: POSITIVE — AB
Cannabinoid 50 Ng, Ur ~~LOC~~: NOT DETECTED
Cocaine Metabolite,Ur ~~LOC~~: NOT DETECTED
MDMA (Ecstasy)Ur Screen: NOT DETECTED
Methadone Scn, Ur: NOT DETECTED
Opiate, Ur Screen: NOT DETECTED
Phencyclidine (PCP) Ur S: NOT DETECTED
Tricyclic, Ur Screen: POSITIVE — AB

## 2016-12-16 LAB — HEPATIC FUNCTION PANEL
ALT: 51 U/L (ref 14–54)
AST: 44 U/L — ABNORMAL HIGH (ref 15–41)
Albumin: 3.4 g/dL — ABNORMAL LOW (ref 3.5–5.0)
Alkaline Phosphatase: 138 U/L — ABNORMAL HIGH (ref 38–126)
Bilirubin, Direct: 0.3 mg/dL (ref 0.1–0.5)
Indirect Bilirubin: 0.7 mg/dL (ref 0.3–0.9)
Total Bilirubin: 1 mg/dL (ref 0.3–1.2)
Total Protein: 6.3 g/dL — ABNORMAL LOW (ref 6.5–8.1)

## 2016-12-16 LAB — TROPONIN I: Troponin I: <0.03 ng/mL (ref <0.03)

## 2016-12-16 LAB — AMMONIA: Ammonia: 61 umol/L — ABNORMAL HIGH (ref 9–35)

## 2016-12-16 MED ORDER — LIDOCAINE 5 % EX PTCH: 1.0000 | MEDICATED_PATCH | CUTANEOUS | 0 refills | Status: DC

## 2016-12-16 MED ORDER — INSULIN ASPART 100 UNIT/ML ~~LOC~~ SOLN
6.0000 [IU] | Freq: Once | SUBCUTANEOUS | Status: AC
  Administered 2016-12-16: 6 [IU] via INTRAVENOUS
  Filled 2016-12-16: qty 1

## 2016-12-16 MED ORDER — KETOROLAC TROMETHAMINE 30 MG/ML IJ SOLN
30.0000 mg | Freq: Once | INTRAMUSCULAR | Status: AC
  Administered 2016-12-16: 30 mg via INTRAVENOUS
  Filled 2016-12-16: qty 1

## 2016-12-16 MED ORDER — LIDOCAINE 5 % EX PTCH
1.0000 | MEDICATED_PATCH | CUTANEOUS | Status: DC
  Administered 2016-12-16: 1 via TRANSDERMAL
  Filled 2016-12-16: qty 1

## 2016-12-16 MED ORDER — INSULIN ASPART 100 UNIT/ML ~~LOC~~ SOLN
8.0000 [IU] | Freq: Once | SUBCUTANEOUS | Status: AC
  Administered 2016-12-16: 8 [IU] via INTRAVENOUS
  Filled 2016-12-16: qty 1

## 2016-12-16 NOTE — ED Notes (Signed)
Pt unable to stay awake while asking questions.

## 2016-12-16 NOTE — ED Notes (Signed)
MD at bedside. 

## 2016-12-16 NOTE — ED Provider Notes (Signed)
[facility] Emergency Department Provider Note   First MD Initiated Contact with Patient 12/15/16 2345     (approximate)  I have reviewed the triage vital signs and the nursing notes.   HISTORY  Chief Complaint Chest Pain   HPI Lori Mckenzie is a 51 y.o. female with below list of chronic medical conditions presents to the emergency department with multiple medical complaints including back pain that radiates to the chest.  Patient states current pain score is 10 out of 10 and that pain began approximately 1 hour before arrival to the emergency department.  In addition the patient admits to low back pain that radiates into buttocks and down the legs.  Patient also admits to hyperglycemia stating that her sugar was over 400.  Patient denies any recent illness however she does admit to recent steroid shots to her lumbar spine performed by Dr. Dossie Arbour.   Past Medical History:  Diagnosis Date  . Abdominal abscess 08/25/2014  . Acid reflux 08/10/2010  . Acute cervical myofascial strain 02/09/2016  . Anxiety   . Ascites   . Asthma   . Back pain   . Bile leak, postoperative 07/17/2014  . Brittle bone disease   . Cancer ([place])    Uteriine  ca 45yr ago partial hysterectomy  . Cervical disc disease   . Chronic kidney disease   . Collagen vascular disease ([place]    RA  3-4 yrs ago  . COPD (chronic obstructive pulmonary disease) ([place]   . Diabetes mellitus without complication ([place]   . GERD (gastroesophageal reflux disease)   . Hypertension   . Hypothyroidism   . Left upper quadrant pain 01/09/2014  . Major depressive disorder with single episode 12/05/2011  . Major depressive disorder, single episode 12/05/2011  . Migraines   . NASH (nonalcoholic steatohepatitis)   . Respiratory infection    2/17  . Shock ([place] 09/18/2014  . Sleep apnea   . Sleep apnea   . Syncope 11/16/2014  . Thyroid disease   . TIA (transient ischemic attack)     Patient Active Problem List     Diagnosis Date Noted  . Chronic feet pain (Secondary source of pain) (Bilateral) (R>L) 10/26/2016  . Acute hepatic encephalopathy 09/23/2016  . Acute postoperative pain 08/09/2016  . Low back pain due to L1-2 disc extrusion (caudal) (Left) 07/11/2016  . Abdominal pain 06/28/2016  . Constipation 06/20/2016  . Hyperglycemia 06/20/2016  . Nausea without vomiting 06/06/2016  . Chronic lower extremity pain (Bilateral) 05/19/2016  . Spinal stenosis, thoracic region (T10-11) 05/19/2016  . Diabetes mellitus, insulin dependent (IDDM), uncontrolled ([place] 04/12/2016  . Chronic sacroiliac joint pain (Bilateral) (L>R) 03/23/2016  . Chronic upper extremity pain (Left) 03/10/2016  . Chronic radicular cervical pain (L) 03/10/2016  . Osteoarthritis 02/24/2016  . Allodynia 02/09/2016  . Chronic pain syndrome 01/07/2016  . Opiate withdrawal ([place] 12/22/2015  . Elevated liver enzymes 09/28/2015  . Cirrhosis of liver without ascites ([place] 09/24/2015  . Cirrhosis ([place] 09/24/2015  . Altered mental status 07/24/2015  . Uncontrolled diabetes mellitus ([place] 07/24/2015  . Radicular pain of thoracic region 04/06/2015  . Hepatic encephalopathy ([place] 03/11/2015  . Elevated sedimentation rate 03/05/2015  . Elevated C-reactive protein (CRP) 03/05/2015  . Lumbar facet syndrome (Bilateral) (R>L) 03/05/2015  . Cervical spondylosis 03/05/2015  . Lumbar spondylosis 03/05/2015  . Encounter for chronic pain management 03/05/2015  . Chronic shoulder pain (Bilateral) (R>L) 03/05/2015  . Chronic carpal tunnel syndrome (Bilateral) 03/05/2015  . Chronic  hip pain (Bilateral) (L>R) 03/05/2015  . Chronic upper back pain (Bilateral) (L>R) 03/05/2015  . Osteoporosis, idiopathic 03/05/2015  . Abnormal MRI, lumbar spine (02/03/2015) 03/05/2015  . Thoracic radiculitis 02/05/2015  . Vitamin D deficiency 01/13/2015  . B12 deficiency 01/13/2015  . Folate deficiency 01/13/2015  . Subacute lumbar radiculopathy (left side) (S1  dermatome) 12/10/2014  . Drowsiness 11/16/2014  . Episode of syncope 11/16/2014  . Somnolence 11/16/2014  . Opiate use (75 MME/Day) 10/28/2014  . Long term prescription opiate use 10/28/2014  . Long term current use of opiate analgesic 10/28/2014  . Encounter for therapeutic drug level monitoring 10/28/2014  . Chronic epigastric abdominal pain 10/28/2014  . Chronic low back pain (Primary Source of Pain) (Bilateral) (R>L) 10/28/2014  . Chronic neck pain [facility] source of pain) (Bilateral) (R>L) 10/28/2014  . Ascites 09/05/2014  . NASH (nonalcoholic steatohepatitis) 09/05/2014  . Hypokalemia 09/05/2014  . Dysthymia 08/05/2014  . Other social stressor 08/05/2014  . Steatohepatitis 07/15/2014  . Type 2 diabetes mellitus ([place]) 07/12/2014  . COPD with acute exacerbation ([place]) 07/12/2014  . GERD (gastroesophageal reflux disease) 07/12/2014  . OSA on CPAP 07/12/2014  . Anxiety 07/12/2014  . 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 Lumbar and sacral osteoarthritis 03/26/2014  . Myofascial pain 03/26/2014  . Lumbar spinal stenosis 03/26/2014  . Lumbosacral spondylosis without myelopathy 03/26/2014  . 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 Lumbar central spinal stenosis (L1-2 and L4-5) 03/26/2014  . Spondylosis of lumbar region without myelopathy or radiculopathy 03/26/2014  . Breath shortness 01/09/2014  . Diastolic dysfunction 77/82/4235  . Airway hyperreactivity 08/06/2013  . Essential (primary) hypertension 08/06/2013  . Asthma 08/06/2013  . Clinical depression 12/05/2011    Past Surgical History:  Procedure Laterality Date  . ABDOMINAL HYSTERECTOMY    . CHOLECYSTECTOMY N/A 07/15/2014   Procedure: LAPAROSCOPIC CHOLECYSTECTOMY with liver biopsy ;  Surgeon: Sherri Rad, MD;  Location: [facility] ORS;  Service: General;  Laterality: N/A;  . COLONOSCOPY WITH PROPOFOL N/A 06/23/2014   Procedure: COLONOSCOPY WITH PROPOFOL;  Surgeon: Lollie Sails, MD;  Location: [facility]  ENDOSCOPY;  Service: Endoscopy;  Laterality: N/A;  . ERCP N/A 07/16/2014   Procedure: ENDOSCOPIC RETROGRADE CHOLANGIOPANCREATOGRAPHY (ERCP);  Surgeon: Clarene Essex, MD;  Location: [facility] ENDOSCOPY;  Service: Endoscopy;  Laterality: N/A;  . ERCP N/A 10/03/2014   Procedure: ENDOSCOPIC RETROGRADE CHOLANGIOPANCREATOGRAPHY (ERCP);  Surgeon: Hulen Luster, MD;  Location: [facility] ENDOSCOPY;  Service: Gastroenterology;  Laterality: N/A;  . ESOPHAGOGASTRODUODENOSCOPY N/A 06/23/2014   Procedure: ESOPHAGOGASTRODUODENOSCOPY (EGD);  Surgeon: Lollie Sails, MD;  Location: [facility] ENDOSCOPY;  Service: Endoscopy;  Laterality: N/A;  . ESOPHAGOGASTRODUODENOSCOPY N/A 06/30/2016   Procedure: ESOPHAGOGASTRODUODENOSCOPY (EGD);  Surgeon: Jonathon Bellows, MD;  Location: [facility] ENDOSCOPY;  Service: Endoscopy;  Laterality: N/A;  . ESOPHAGOGASTRODUODENOSCOPY (EGD) WITH PROPOFOL N/A 12/01/2015   Procedure: ESOPHAGOGASTRODUODENOSCOPY (EGD) WITH PROPOFOL;  Surgeon: Lollie Sails, MD;  Location: [facility] ENDOSCOPY;  Service: Endoscopy;  Laterality: N/A;  . TUBAL LIGATION    . WISDOM TOOTH EXTRACTION      Prior to Admission medications   Medication Sig Start Date End Date Taking? Authorizing Provider  albuterol (PROVENTIL HFA;VENTOLIN HFA) 108 (90 Base) MCG/ACT inhaler Inhale 2 puffs into the lungs every 6 (six) hours as needed for wheezing or shortness of breath.    [provider]  cholecalciferol (VITAMIN D) 1000 units tablet Take 2,000 Units by mouth daily.    [provider]  citalopram (CELEXA) 20 MG tablet Take 20-30 mg by mouth daily.  07/24/15   [provider]  diclofenac sodium (VOLTAREN) 1 % GEL Apply 4 g topically every 8 (eight) hours as needed. 12/06/16 06/04/17  Milinda Pointer, MD  dicyclomine (BENTYL) 10 MG capsule Take 10 mg by mouth 4 (four) times daily.     [provider]  fluticasone (FLONASE) 50 MCG/ACT nasal spray Place 1-2 sprays into both nostrils daily.     [provider]    Folate-B12-Intrinsic Factor (INTRINSI B12-FOLATE) 350-093-81 MCG-MCG-MG TABS Take 1 tablet by mouth daily. Reported on 04/06/2015    [provider]  furosemide (LASIX) 40 MG tablet Take 1 tablet (40 mg total) by mouth 2 (two) times daily. 10/26/16   Nicholes Mango, MD  gabapentin (NEURONTIN) 300 MG capsule Take 600 mg by mouth 4 (four) times daily.     [provider]  insulin detemir (LEVEMIR) 100 UNIT/ML injection Inject 0.25 mLs (25 Units total) into the skin 2 (two) times daily. 10/26/16   Gouru, Illene Silver, MD  ipratropium-albuterol (DUONEB) 0.5-2.5 (3) MG/3ML SOLN Take 3 mLs by nebulization every 4 (four) hours as needed. For shortness of breath/wheezing    [provider]  lactulose (CHRONULAC) 10 GM/15ML solution Take 30 mLs (20 g total) by mouth 3 (three) times daily. 10/26/16   Nicholes Mango, MD  nystatin (MYCOSTATIN/NYSTOP) powder Apply 1 g topically 3 (three) times daily as needed. For irritation 10/26/16   Gouru, Illene Silver, MD  pantoprazole (PROTONIX) 40 MG tablet Take 40 mg by mouth 2 (two) times daily before a meal.    [provider]  potassium chloride SA (K-DUR,KLOR-CON) 20 MEQ tablet Take 10-20 mEq by mouth daily as needed. For high blood pressure    [provider]  promethazine (PHENERGAN) 12.5 MG tablet Take 2 tablets (25 mg total) by mouth every 6 (six) hours as needed for nausea or vomiting. 09/25/16   Dustin Flock, MD  rifaximin (XIFAXAN) 550 MG TABS tablet Take 550 mg by mouth 2 (two) times daily.     [provider]  rizatriptan (MAXALT) 10 MG tablet Take 10 mg by mouth as needed for migraine.     [provider]  spironolactone (ALDACTONE) 100 MG tablet Take 100 mg by mouth daily.     [provider]  vitamin B-12 (CYANOCOBALAMIN) 1000 MCG tablet Take 2,000 mcg by mouth daily.    [provider]    Allergies Tape and Vicodin [hydrocodone-acetaminophen]  Family History  Problem Relation Age of Onset   . Lung cancer Mother   . Ulcers Father   . Emphysema Father   . Heart disease Sister   . Ulcers Sister   . Heart disease Brother     Social History Social History   Tobacco Use  . Smoking status: Never Smoker  . Smokeless tobacco: Never Used  Substance Use Topics  . Alcohol use: No    Comment: occ  . Drug use: No    Review of Systems Constitutional: No fever/chills Eyes: No visual changes. ENT: No sore throat. Cardiovascular: Denies chest pain. Respiratory: Denies shortness of breath. Gastrointestinal: No abdominal pain.  No nausea, no vomiting.  No diarrhea.  No constipation. Genitourinary: Negative for dysuria. Musculoskeletal: Negative for neck pain.  Negative for back pain. Integumentary: Negative for rash. Neurological: Negative for headache no weakness no numbness  ____________________________________________   PHYSICAL EXAM:  VITAL SIGNS: ED Triage Vitals  Enc Vitals Group     BP 12/15/16 2011 118/81     Pulse Rate 12/15/16 2011 90  Resp 12/15/16 2011 20     Temp 12/15/16 2011 98.2 F (36.8 C)     Temp Source 12/15/16 2011 Oral     SpO2 12/15/16 2011 97 %     Weight 12/15/16 1948 115.2 kg (254 lb)     Height 12/15/16 1948 1.575 m (5' 2" )     Head Circumference --      Peak Flow --      Pain Score 12/15/16 1948 10     Pain Loc --      Pain Edu? --      Excl. in [place]? --     Constitutional: Alert and oriented.  Somnolent but aroused by verbal stimuli. Eyes: Conjunctivae are normal.  Pupils pinpoint bilaterally Head: Atraumatic. Ears:  Healthy appearing ear canals and TMs bilaterally Nose: No congestion/rhinnorhea. Mouth/Throat: Mucous membranes are moist.  Oropharynx non-erythematous Neck: No stridor.  No meningeal signs.  No cervical spine tenderness to palpation. Cardiovascular: Normal rate, regular rhythm. Good peripheral circulation. Grossly normal heart sounds. Respiratory: Normal respiratory effort.  No retractions. Lungs  CTAB. Gastrointestinal: Soft and nontender. No distention.  Musculoskeletal: No lower extremity tenderness nor edema. No gross deformities of extremities. Neurologic:  Normal speech and language. No gross focal neurologic deficits are appreciated.  Skin:  Skin is warm, dry and intact. No rash noted. Psychiatric: Mood and affect are normal. Speech and behavior are normal.  ____________________________________________   LABS (all labs ordered are listed, but only abnormal results are displayed)  Labs Reviewed  BASIC METABOLIC PANEL - Abnormal; Notable for the following components:      Result Value   Sodium 134 (*)    Glucose, Bld 478 (*)    All other components within normal limits  CBC - Abnormal; Notable for the following components:   MCV 100.3 (*)    MCH 34.2 (*)    Platelets 75 (*)    All other components within normal limits  GLUCOSE, CAPILLARY - Abnormal; Notable for the following components:   Glucose-Capillary 486 (*)    All other components within normal limits  TROPONIN I  AMMONIA  HEPATIC FUNCTION PANEL   ____________________________________________  EKG  ED ECG REPORT I, Oklee N Zawadi Aplin, the attending physician, personally viewed and interpreted this ECG.   Date: 12/16/2016  EKG Time: 7:48PM  Rate: 103  Rhythm:Normal Sinus rhythm  Axis: Normal  Intervals: Normal  ST&T Change: None  ____________________________________________  RADIOLOGY I, Charlton N Sava Proby, personally viewed and evaluated these images (plain radiographs) as part of my medical decision making, as well as reviewing the written report by the radiologist.  Dg Chest 2 View  Result Date: 12/15/2016 CLINICAL DATA:  Chest pain and blurry vision with nausea. EXAM: CHEST  2 VIEW COMPARISON:  None. FINDINGS: Stable borderline cardiomegaly. No aortic aneurysm. No alveolar consolidation or pneumothorax. No pleural effusion. No overt pulmonary edema. The visualized skeletal structures are  unremarkable. IMPRESSION: No active cardiopulmonary disease. Electronically Signed   By: Ashley Royalty M.D.   On: 12/15/2016 20:16      Procedures   ____________________________________________   INITIAL IMPRESSION / ASSESSMENT AND PLAN / ED COURSE  As part of my medical decision making, I reviewed the following data within the electronic MEDICAL RECORD NUMBER1 year old female presents with above stated history and physical exam. Regarding chest pain EKG negative, labs negative including troponin x 2. Regarding the patient multiple other pain related complaints I suspect it to be secondary to his chronic pain.  Patient requested  IV morphine or Dilaudid as well as "something for my nerves".  This was not administered secondary to patient's very somnolent state as well as low systolic blood pressure.  Lidoderm patch was applied to the patient's lower back Toradol 30 mg given.  On my reevaluation the patient was asleep on awakening patient stated that pain had improved.  ____________________________________________  FINAL CLINICAL IMPRESSION(S) / ED DIAGNOSES  Final diagnoses:  Other chronic pain  Bilateral sciatica     MEDICATIONS GIVEN DURING THIS VISIT:  Medications  ketorolac (TORADOL) 30 MG/ML injection 30 mg (not administered)  lidocaine (LIDODERM) 5 % 1 patch (not administered)  insulin aspart (novoLOG) injection 8 Units (8 Units Intravenous Given 12/16/16 0011)     ED Discharge Orders    None       Note:  This document was prepared using Dragon voice recognition software and may include unintentional dictation errors.    Gregor Hams, MD 12/16/16 [phone]

## 2016-12-20 ENCOUNTER — Observation Stay
Admission: EM | Admit: 2016-12-20 | Discharge: 2016-12-22 | Disposition: A | Discharge status: Home / Self Care | Payer: Medicaid Other | Attending: Internal Medicine | Admitting: Internal Medicine

## 2016-12-20 ENCOUNTER — Other Ambulatory Visit: Payer: Self-pay

## 2016-12-20 ENCOUNTER — Encounter: Payer: Self-pay | Admitting: Intensive Care

## 2016-12-20 ENCOUNTER — Emergency Department: Payer: Medicaid Other

## 2016-12-20 DIAGNOSIS — J441 Chronic obstructive pulmonary disease with (acute) exacerbation: Secondary | ICD-10-CM | POA: Insufficient documentation

## 2016-12-20 DIAGNOSIS — I5032 Chronic diastolic (congestive) heart failure: Secondary | ICD-10-CM | POA: Diagnosis present

## 2016-12-20 DIAGNOSIS — F341 Dysthymic disorder: Secondary | ICD-10-CM | POA: Insufficient documentation

## 2016-12-20 DIAGNOSIS — M6080 Other myositis, unspecified site: Secondary | ICD-10-CM | POA: Insufficient documentation

## 2016-12-20 DIAGNOSIS — M47817 Spondylosis without myelopathy or radiculopathy, lumbosacral region: Secondary | ICD-10-CM | POA: Diagnosis not present

## 2016-12-20 DIAGNOSIS — IMO0001 Reserved for inherently not codable concepts without codable children: Secondary | ICD-10-CM | POA: Diagnosis present

## 2016-12-20 DIAGNOSIS — I10 Essential (primary) hypertension: Secondary | ICD-10-CM | POA: Diagnosis present

## 2016-12-20 DIAGNOSIS — R0789 Other chest pain: Secondary | ICD-10-CM | POA: Diagnosis not present

## 2016-12-20 DIAGNOSIS — M25519 Pain in unspecified shoulder: Secondary | ICD-10-CM | POA: Insufficient documentation

## 2016-12-20 DIAGNOSIS — E1165 Type 2 diabetes mellitus with hyperglycemia: Secondary | ICD-10-CM | POA: Insufficient documentation

## 2016-12-20 DIAGNOSIS — N189 Chronic kidney disease, unspecified: Secondary | ICD-10-CM | POA: Diagnosis not present

## 2016-12-20 DIAGNOSIS — K219 Gastro-esophageal reflux disease without esophagitis: Secondary | ICD-10-CM | POA: Diagnosis not present

## 2016-12-20 DIAGNOSIS — R188 Other ascites: Secondary | ICD-10-CM | POA: Diagnosis not present

## 2016-12-20 DIAGNOSIS — M810 Age-related osteoporosis without current pathological fracture: Secondary | ICD-10-CM | POA: Insufficient documentation

## 2016-12-20 DIAGNOSIS — G4733 Obstructive sleep apnea (adult) (pediatric): Secondary | ICD-10-CM

## 2016-12-20 DIAGNOSIS — Z91048 Other nonmedicinal substance allergy status: Secondary | ICD-10-CM | POA: Insufficient documentation

## 2016-12-20 DIAGNOSIS — I130 Hypertensive heart and chronic kidney disease with heart failure and stage 1 through stage 4 chronic kidney disease, or unspecified chronic kidney disease: Secondary | ICD-10-CM | POA: Insufficient documentation

## 2016-12-20 DIAGNOSIS — E1122 Type 2 diabetes mellitus with diabetic chronic kidney disease: Secondary | ICD-10-CM | POA: Diagnosis not present

## 2016-12-20 DIAGNOSIS — G894 Chronic pain syndrome: Secondary | ICD-10-CM | POA: Diagnosis present

## 2016-12-20 DIAGNOSIS — M48061 Spinal stenosis, lumbar region without neurogenic claudication: Secondary | ICD-10-CM | POA: Diagnosis not present

## 2016-12-20 DIAGNOSIS — K7581 Nonalcoholic steatohepatitis (NASH): Secondary | ICD-10-CM | POA: Insufficient documentation

## 2016-12-20 DIAGNOSIS — Z885 Allergy status to narcotic agent status: Secondary | ICD-10-CM | POA: Insufficient documentation

## 2016-12-20 DIAGNOSIS — Z8673 Personal history of transient ischemic attack (TIA), and cerebral infarction without residual deficits: Secondary | ICD-10-CM | POA: Insufficient documentation

## 2016-12-20 DIAGNOSIS — E538 Deficiency of other specified B group vitamins: Secondary | ICD-10-CM | POA: Insufficient documentation

## 2016-12-20 DIAGNOSIS — M47816 Spondylosis without myelopathy or radiculopathy, lumbar region: Secondary | ICD-10-CM | POA: Insufficient documentation

## 2016-12-20 DIAGNOSIS — K5900 Constipation, unspecified: Secondary | ICD-10-CM | POA: Insufficient documentation

## 2016-12-20 DIAGNOSIS — M4804 Spinal stenosis, thoracic region: Secondary | ICD-10-CM | POA: Diagnosis not present

## 2016-12-20 DIAGNOSIS — Z9049 Acquired absence of other specified parts of digestive tract: Secondary | ICD-10-CM | POA: Insufficient documentation

## 2016-12-20 DIAGNOSIS — M5126 Other intervertebral disc displacement, lumbar region: Secondary | ICD-10-CM | POA: Diagnosis not present

## 2016-12-20 DIAGNOSIS — K7460 Unspecified cirrhosis of liver: Secondary | ICD-10-CM | POA: Insufficient documentation

## 2016-12-20 DIAGNOSIS — G5603 Carpal tunnel syndrome, bilateral upper limbs: Secondary | ICD-10-CM | POA: Diagnosis not present

## 2016-12-20 DIAGNOSIS — Z9989 Dependence on other enabling machines and devices: Secondary | ICD-10-CM

## 2016-12-20 DIAGNOSIS — E559 Vitamin D deficiency, unspecified: Secondary | ICD-10-CM | POA: Insufficient documentation

## 2016-12-20 DIAGNOSIS — Z23 Encounter for immunization: Secondary | ICD-10-CM | POA: Diagnosis not present

## 2016-12-20 DIAGNOSIS — Z90710 Acquired absence of both cervix and uterus: Secondary | ICD-10-CM | POA: Insufficient documentation

## 2016-12-20 DIAGNOSIS — R079 Chest pain, unspecified: Secondary | ICD-10-CM | POA: Diagnosis present

## 2016-12-20 DIAGNOSIS — F329 Major depressive disorder, single episode, unspecified: Secondary | ICD-10-CM | POA: Insufficient documentation

## 2016-12-20 DIAGNOSIS — F419 Anxiety disorder, unspecified: Secondary | ICD-10-CM | POA: Diagnosis not present

## 2016-12-20 DIAGNOSIS — K7290 Hepatic failure, unspecified without coma: Secondary | ICD-10-CM | POA: Insufficient documentation

## 2016-12-20 DIAGNOSIS — E039 Hypothyroidism, unspecified: Secondary | ICD-10-CM | POA: Insufficient documentation

## 2016-12-20 DIAGNOSIS — E876 Hypokalemia: Secondary | ICD-10-CM | POA: Insufficient documentation

## 2016-12-20 DIAGNOSIS — Z794 Long term (current) use of insulin: Secondary | ICD-10-CM | POA: Insufficient documentation

## 2016-12-20 DIAGNOSIS — Z79899 Other long term (current) drug therapy: Secondary | ICD-10-CM | POA: Insufficient documentation

## 2016-12-20 LAB — GLUCOSE, CAPILLARY
Glucose-Capillary: 317 mg/dL — ABNORMAL HIGH (ref 65–99)
Glucose-Capillary: 341 mg/dL — ABNORMAL HIGH (ref 65–99)
Glucose-Capillary: 378 mg/dL — ABNORMAL HIGH (ref 65–99)

## 2016-12-20 LAB — CBC
HCT: 46.1 % (ref 35.0–47.0)
Hemoglobin: 15.6 g/dL (ref 12.0–16.0)
MCH: 34 pg (ref 26.0–34.0)
MCHC: 33.8 g/dL (ref 32.0–36.0)
MCV: 100.7 fL — ABNORMAL HIGH (ref 80.0–100.0)
Platelets: 74 10*3/uL — ABNORMAL LOW (ref 150–440)
RBC: 4.58 MIL/uL (ref 3.80–5.20)
RDW: 14 % (ref 11.5–14.5)
WBC: 5.6 10*3/uL (ref 3.6–11.0)

## 2016-12-20 LAB — BASIC METABOLIC PANEL
Anion gap: 8 (ref 5–15)
BUN: 11 mg/dL (ref 6–20)
CO2: 24 mmol/L (ref 22–32)
Calcium: 9.3 mg/dL (ref 8.9–10.3)
Chloride: 99 mmol/L — ABNORMAL LOW (ref 101–111)
Creatinine, Ser: 0.72 mg/dL (ref 0.44–1.00)
GFR calc Af Amer: >60 mL/min (ref >60)
GFR calc non Af Amer: >60 mL/min (ref >60)
Glucose, Bld: 489 mg/dL — ABNORMAL HIGH (ref 65–99)
Potassium: 4.7 mmol/L (ref 3.5–5.1)
Sodium: 131 mmol/L — ABNORMAL LOW (ref 135–145)

## 2016-12-20 LAB — HEPATIC FUNCTION PANEL
ALT: 70 U/L — ABNORMAL HIGH (ref 14–54)
AST: 52 U/L — ABNORMAL HIGH (ref 15–41)
Albumin: 3.5 g/dL (ref 3.5–5.0)
Alkaline Phosphatase: 129 U/L — ABNORMAL HIGH (ref 38–126)
Bilirubin, Direct: 0.3 mg/dL (ref 0.1–0.5)
Indirect Bilirubin: 0.8 mg/dL (ref 0.3–0.9)
Total Bilirubin: 1.1 mg/dL (ref 0.3–1.2)
Total Protein: 6.1 g/dL — ABNORMAL LOW (ref 6.5–8.1)

## 2016-12-20 LAB — AMMONIA: Ammonia: 30 umol/L (ref 9–35)

## 2016-12-20 LAB — TROPONIN I
Troponin I: <0.03 ng/mL (ref <0.03)
Troponin I: <0.03 ng/mL (ref <0.03)

## 2016-12-20 LAB — LIPASE, BLOOD: Lipase: 28 U/L (ref 11–51)

## 2016-12-20 MED ORDER — SODIUM CHLORIDE 0.9 % IV BOLUS (SEPSIS)
1000.0000 mL | Freq: Once | INTRAVENOUS | Status: AC
  Administered 2016-12-20: 1000 mL via INTRAVENOUS

## 2016-12-20 MED ORDER — SPIRONOLACTONE 25 MG PO TABS
100.0000 mg | ORAL_TABLET | Freq: Every day | ORAL | Status: DC
  Administered 2016-12-21 – 2016-12-22 (×2): 100 mg via ORAL
  Filled 2016-12-20 (×2): qty 4

## 2016-12-20 MED ORDER — NITROGLYCERIN 0.4 MG SL SUBL
SUBLINGUAL_TABLET | SUBLINGUAL | Status: AC
  Administered 2016-12-20: 0.4 mg via SUBLINGUAL
  Filled 2016-12-20: qty 1

## 2016-12-20 MED ORDER — ONDANSETRON HCL 4 MG PO TABS
4.0000 mg | ORAL_TABLET | Freq: Four times a day (QID) | ORAL | Status: DC | PRN
  Administered 2016-12-21 – 2016-12-22 (×3): 4 mg via ORAL
  Filled 2016-12-20 (×3): qty 1

## 2016-12-20 MED ORDER — INFLUENZA VAC SPLIT QUAD 0.5 ML IM SUSY
0.5000 mL | PREFILLED_SYRINGE | INTRAMUSCULAR | Status: AC
  Administered 2016-12-22: 0.5 mL via INTRAMUSCULAR
  Filled 2016-12-20: qty 0.5

## 2016-12-20 MED ORDER — INSULIN ASPART 100 UNIT/ML ~~LOC~~ SOLN
0.0000 [IU] | Freq: Four times a day (QID) | SUBCUTANEOUS | Status: DC
  Administered 2016-12-20: 9 [IU] via SUBCUTANEOUS
  Administered 2016-12-21: 3 [IU] via SUBCUTANEOUS
  Administered 2016-12-21: 9 [IU] via SUBCUTANEOUS
  Administered 2016-12-22: 5 [IU] via SUBCUTANEOUS
  Administered 2016-12-22: 7 [IU] via SUBCUTANEOUS
  Filled 2016-12-20 (×5): qty 1

## 2016-12-20 MED ORDER — RIFAXIMIN 550 MG PO TABS
550.0000 mg | ORAL_TABLET | Freq: Two times a day (BID) | ORAL | Status: DC
  Administered 2016-12-21 – 2016-12-22 (×3): 550 mg via ORAL
  Filled 2016-12-20 (×5): qty 1

## 2016-12-20 MED ORDER — ENOXAPARIN SODIUM 40 MG/0.4ML ~~LOC~~ SOLN
40.0000 mg | SUBCUTANEOUS | Status: DC
  Administered 2016-12-20: 40 mg via SUBCUTANEOUS
  Filled 2016-12-20: qty 0.4

## 2016-12-20 MED ORDER — LORAZEPAM 2 MG/ML IJ SOLN
1.0000 mg | Freq: Once | INTRAMUSCULAR | Status: AC
  Administered 2016-12-20: 1 mg via INTRAVENOUS
  Filled 2016-12-20: qty 1

## 2016-12-20 MED ORDER — ACETAMINOPHEN 325 MG PO TABS: 650.0000 mg | ORAL_TABLET | Freq: Four times a day (QID) | ORAL | Status: DC | PRN

## 2016-12-20 MED ORDER — INSULIN DETEMIR 100 UNIT/ML ~~LOC~~ SOLN
25.0000 [IU] | Freq: Two times a day (BID) | SUBCUTANEOUS | Status: DC
  Administered 2016-12-20 – 2016-12-21 (×2): 25 [IU] via SUBCUTANEOUS
  Filled 2016-12-20 (×3): qty 0.25

## 2016-12-20 MED ORDER — LIDOCAINE 5 % EX PTCH
1.0000 | MEDICATED_PATCH | CUTANEOUS | Status: DC
  Administered 2016-12-21: 1 via TRANSDERMAL
  Filled 2016-12-20 (×3): qty 1

## 2016-12-20 MED ORDER — FUROSEMIDE 40 MG PO TABS
40.0000 mg | ORAL_TABLET | Freq: Every day | ORAL | Status: DC
  Administered 2016-12-21 – 2016-12-22 (×2): 40 mg via ORAL
  Filled 2016-12-20 (×2): qty 1

## 2016-12-20 MED ORDER — CITALOPRAM HYDROBROMIDE 20 MG PO TABS
40.0000 mg | ORAL_TABLET | Freq: Every day | ORAL | Status: DC
  Administered 2016-12-21 – 2016-12-22 (×2): 40 mg via ORAL
  Filled 2016-12-20 (×2): qty 2

## 2016-12-20 MED ORDER — IPRATROPIUM-ALBUTEROL 0.5-2.5 (3) MG/3ML IN SOLN
3.0000 mL | RESPIRATORY_TRACT | Status: DC | PRN
  Administered 2016-12-21: 3 mL via RESPIRATORY_TRACT
  Filled 2016-12-20: qty 3

## 2016-12-20 MED ORDER — PROMETHAZINE HCL 25 MG PO TABS
25.0000 mg | ORAL_TABLET | Freq: Four times a day (QID) | ORAL | Status: DC | PRN
  Administered 2016-12-21: 25 mg via ORAL
  Filled 2016-12-20: qty 1

## 2016-12-20 MED ORDER — ONDANSETRON HCL 4 MG/2ML IJ SOLN
4.0000 mg | Freq: Once | INTRAMUSCULAR | Status: AC
  Administered 2016-12-20: 4 mg via INTRAVENOUS
  Filled 2016-12-20: qty 2

## 2016-12-20 MED ORDER — LACTULOSE 10 GM/15ML PO SOLN
20.0000 g | Freq: Three times a day (TID) | ORAL | Status: DC
  Administered 2016-12-21 – 2016-12-22 (×4): 20 g via ORAL
  Filled 2016-12-20 (×4): qty 30

## 2016-12-20 MED ORDER — NITROGLYCERIN 0.4 MG SL SUBL
0.4000 mg | SUBLINGUAL_TABLET | SUBLINGUAL | Status: DC | PRN
  Administered 2016-12-20 (×2): 0.4 mg via SUBLINGUAL
  Filled 2016-12-20: qty 1

## 2016-12-20 MED ORDER — DICLOFENAC SODIUM 1 % TD GEL
4.0000 g | Freq: Three times a day (TID) | TRANSDERMAL | Status: DC | PRN
  Administered 2016-12-21: 09:00:00 4 g via TOPICAL
  Filled 2016-12-20: qty 100

## 2016-12-20 MED ORDER — GABAPENTIN 300 MG PO CAPS
600.0000 mg | ORAL_CAPSULE | Freq: Four times a day (QID) | ORAL | Status: DC
  Administered 2016-12-20 – 2016-12-22 (×6): 600 mg via ORAL
  Filled 2016-12-20 (×6): qty 2

## 2016-12-20 MED ORDER — ACETAMINOPHEN 650 MG RE SUPP: 650.0000 mg | Freq: Four times a day (QID) | RECTAL | Status: DC | PRN

## 2016-12-20 MED ORDER — ONDANSETRON HCL 4 MG/2ML IJ SOLN
4.0000 mg | Freq: Four times a day (QID) | INTRAMUSCULAR | Status: DC | PRN
  Administered 2016-12-21: 4 mg via INTRAVENOUS
  Filled 2016-12-20: qty 2

## 2016-12-20 MED ORDER — PANTOPRAZOLE SODIUM 40 MG PO TBEC
40.0000 mg | DELAYED_RELEASE_TABLET | Freq: Two times a day (BID) | ORAL | Status: DC
  Administered 2016-12-21 – 2016-12-22 (×3): 40 mg via ORAL
  Filled 2016-12-20 (×3): qty 1

## 2016-12-20 MED ORDER — OXYCODONE HCL 5 MG PO TABS
5.0000 mg | ORAL_TABLET | Freq: Four times a day (QID) | ORAL | Status: DC | PRN
  Administered 2016-12-20 – 2016-12-22 (×6): 5 mg via ORAL
  Filled 2016-12-20 (×6): qty 1

## 2016-12-20 MED ORDER — INSULIN ASPART 100 UNIT/ML ~~LOC~~ SOLN
5.0000 [IU] | Freq: Once | SUBCUTANEOUS | Status: AC
  Administered 2016-12-20: 5 [IU] via INTRAVENOUS
  Filled 2016-12-20: qty 0.05
  Filled 2016-12-20: qty 1

## 2016-12-20 NOTE — ED Notes (Signed)
CBG [id number] MD notified

## 2016-12-20 NOTE — ED Triage Notes (Signed)
Patient c/o central chest tightness that started around 1:30pm with radiation to L arm. Ambulatory in triage with no problems. A&O x4.

## 2016-12-20 NOTE — ED Notes (Signed)
Pt presents with left-sided chest pain, nausea, elevated glucose. Pt states she is taking her insulin as prescribed. Pt alert & oriented with nad noted.

## 2016-12-20 NOTE — ED Notes (Signed)
ED Provider at bedside. 

## 2016-12-20 NOTE — Progress Notes (Deleted)
Patient's Name: Lori Mckenzie  MRN: [id number]  Referring Provider: Ardine Eng, MD  DOB: March 19, 1965  PCP: Ardine Eng, MD  DOS: 12/21/2016  Note by: Gaspar Cola, MD  Service setting: Ambulatory outpatient  Specialty: Interventional Pain Management  Location: [facility] (AMB) Pain Management Facility    Patient type: Established   Primary Reason(s) for Visit: Encounter for post-procedure evaluation of chronic illness with mild to moderate exacerbation CC: No chief complaint on file.  HPI  Lori Mckenzie is a 51 y.o. year old, female patient, who comes today for a post-procedure evaluation. She has Type 2 diabetes mellitus ([place]); COPD with acute exacerbation (HCC); GERD (gastroesophageal reflux disease); OSA on CPAP; Anxiety; Steatohepatitis; Dysthymia; Other social stressor; Ascites; NASH (nonalcoholic steatohepatitis); Hypokalemia; Opiate use (75 MME/Day); Long term prescription opiate use; Long term current use of opiate analgesic; Encounter for therapeutic drug level monitoring; Chronic epigastric abdominal pain; Chronic low back pain (Primary Source of Pain) (Bilateral) (R>L); Chronic neck pain (Tertiary source of pain) (Bilateral) (R>L); Breath shortness; Diastolic dysfunction; Clinical depression; Airway hyperreactivity; Essential (primary) hypertension; 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, L1-2, & L4-5); Lumbar and sacral osteoarthritis; Myofascial pain; Drowsiness; Episode of syncope; Subacute lumbar radiculopathy (left side) (S1 dermatome); Vitamin D deficiency; B12 deficiency; Folate deficiency; Thoracic radiculitis; Elevated sedimentation rate; Elevated C-reactive protein (CRP); Lumbar facet syndrome (Bilateral) (R>L); Cervical spondylosis; Lumbar spondylosis; Encounter for chronic pain management; Chronic shoulder pain (Bilateral) (R>L); Chronic carpal tunnel syndrome (Bilateral); Chronic hip pain (Bilateral) (L>R); Chronic upper back pain (Bilateral) (L>R); Osteoporosis, idiopathic; Abnormal  MRI, lumbar spine (02/03/2015); Hepatic encephalopathy ([place]); Radicular pain of thoracic region; Altered mental status; Cirrhosis of liver without ascites ([place]); Elevated liver enzymes; Opiate withdrawal ([place]); Chronic pain syndrome; Asthma; Lumbar spinal stenosis; Lumbosacral spondylosis without myelopathy; Neuromyositis; Somnolence; Lumbar central spinal stenosis (L1-2 and L4-5); Spondylosis of lumbar region without myelopathy or radiculopathy; Allodynia; Osteoarthritis; Chronic upper extremity pain (Left); Chronic radicular cervical pain (L); Chronic sacroiliac joint pain (Bilateral) (L>R); Diabetes mellitus, insulin dependent (IDDM), uncontrolled ([place]); Chronic lower extremity pain (Bilateral); Spinal stenosis, thoracic region (T10-11); Nausea without vomiting; Cirrhosis ([place]); Constipation; Hyperglycemia; Uncontrolled diabetes mellitus ([place]); Abdominal pain; Low back pain due to L1-2 disc extrusion (caudal) (Left); Acute postoperative pain; Acute hepatic encephalopathy; and Chronic feet pain (Secondary source of pain) (Bilateral) (R>L) on their problem list. Her primarily concern today is the No chief complaint on file.  Pain Assessment: Location:     Radiating:   Onset:   Duration:   Quality:   Severity:  /10 (self-reported pain score)  Note: Reported level is compatible with observation.                         When using our objective Pain Scale, levels between 6 and 10/10 are said to belong in an emergency room, as it progressively worsens from a 6/10, described as severely limiting, requiring emergency care not usually available at an outpatient pain management facility. At a 6/10 level, communication becomes difficult and requires great effort. Assistance to reach the emergency department may be required. Facial flushing and profuse sweating along with potentially dangerous increases in heart rate and blood pressure will be evident. Effect on ADL:   Timing:   Modifying factors:    Lori Mckenzie  comes in today for post-procedure evaluation after the treatment done on 12/06/2016.  Further details on both, my assessment(s), as well as the proposed treatment plan, please see below.  Post-Procedure Assessment  12/06/2016 Procedure:  Diagnostic bilateral lumbar facet block under fluoroscopic guidance and IV sedation Pre-procedure pain score:  4/10 Post-procedure pain score: 0/10 (100% relief) Influential Factors: BMI:   Intra-procedural challenges: None observed.         Assessment challenges: None detected.              Reported side-effects: None.        Post-procedural adverse reactions or complications: None reported         Sedation: Please see nurses note. When no sedatives are used, the analgesic levels obtained are directly associated to the effectiveness of the local anesthetics. However, when sedation is provided, the level of analgesia obtained during the initial 1 hour following the intervention, is believed to be the result of a combination of factors. These factors may include, but are not limited to: 1. The effectiveness of the local anesthetics used. 2. The effects of the analgesic(s) and/or anxiolytic(s) used. 3. The degree of discomfort experienced by the patient at the time of the procedure. 4. The patients ability and reliability in recalling and recording the events. 5. The presence and influence of possible secondary gains and/or psychosocial factors. Reported result: Relief experienced during the 1st hour after the procedure:   (Ultra-Short Term Relief)            Interpretative annotation: Clinically appropriate result. Analgesia during this period is likely to be Local Anesthetic and/or IV Sedative (Analgesic/Anxiolytic) related.          Effects of local anesthetic: The analgesic effects attained during this period are directly associated to the localized infiltration of local anesthetics and therefore cary significant diagnostic value as to the etiological  location, or anatomical origin, of the pain. Expected duration of relief is directly dependent on the pharmacodynamics of the local anesthetic used. Long-acting (4-6 hours) anesthetics used.  Reported result: Relief during the next 4 to 6 hour after the procedure:   (Short-Term Relief)            Interpretative annotation: Clinically appropriate result. Analgesia during this period is likely to be Local Anesthetic-related.          Long-term benefit: Defined as the period of time past the expected duration of local anesthetics (1 hour for short-acting and 4-6 hours for long-acting). With the possible exception of prolonged sympathetic blockade from the local anesthetics, benefits during this period are typically attributed to, or associated with, other factors such as analgesic sensory neuropraxia, antiinflammatory effects, or beneficial biochemical changes provided by agents other than the local anesthetics.  Reported result: Extended relief following procedure:   (Long-Term Relief)            Interpretative annotation: Clinically appropriate result. Good relief. No permanent benefit expected. Inflammation plays a part in the etiology to the pain.          Current benefits: Defined as reported results that persistent at this point in time.   Analgesia: *** %            Function: Somewhat improved ROM: Somewhat improved Interpretative annotation: Recurrence of symptoms. No permanent benefit expected. Effective diagnostic intervention.          Interpretation: Results would suggest a successful diagnostic intervention.                  Plan:  Please see "Plan of Care" for details.        Laboratory Chemistry  Inflammation Markers (CRP: Acute Phase) (ESR: Chronic Phase) Lab Results  Component Value  Date   CRP <0.8 04/12/2016   ESRSEDRATE 4 04/12/2016   LATICACIDVEN 1.6 09/28/2016                 Rheumatology Markers Lab Results  Component Value Date   RF <10.0 03/05/2015                 Renal Function Markers Lab Results  Component Value Date   BUN 11 12/20/2016   CREATININE 0.72 12/20/2016   GFRAA >60 12/20/2016   GFRNONAA >60 12/20/2016                 Hepatic Function Markers Lab Results  Component Value Date   AST 44 (H) 12/16/2016   ALT 51 12/16/2016   ALBUMIN 3.4 (L) 12/16/2016   ALKPHOS 138 (H) 12/16/2016   LIPASE 23 10/02/2016   AMMONIA 61 (H) 12/16/2016                 Electrolytes Lab Results  Component Value Date   NA 131 (L) 12/20/2016   K 4.7 12/20/2016   CL 99 (L) 12/20/2016   CALCIUM 9.3 12/20/2016   MG 1.7 10/25/2016   PHOS 4.1 10/22/2016                 Neuropathy Markers Lab Results  Component Value Date   VITAMINB12 272 04/12/2016   FOLATE 5.6 (L) 01/08/2015   HGBA1C 5.7 07/12/2014   HIV Non Reactive 06/29/2016                 Bone Pathology Markers Lab Results  Component Value Date   VD25OH 14.3 (L) 01/08/2015   VD125OH2TOT 28.8 01/08/2015   25OHVITD1 36 04/12/2016   25OHVITD2 3.9 04/12/2016   25OHVITD3 32 04/12/2016                 Coagulation Parameters Lab Results  Component Value Date   INR 1.18 12/01/2015   LABPROT 15.1 12/01/2015   APTT 24.6 08/26/2013   PLT 74 (L) 12/20/2016                 Cardiovascular Markers Lab Results  Component Value Date   BNP 11 12/11/2012   CKTOTAL 1,282 (H) 08/26/2013   CKMB 8.6 (H) 08/26/2013   TROPONINI <0.03 12/20/2016   HGB 15.6 12/20/2016   HCT 46.1 12/20/2016                 CA Markers No results found for: CEA, CA125, LABCA2               Note: Lab results reviewed.  Recent Diagnostic Imaging Results  DG Chest 2 View CLINICAL DATA:  Chest pain and blurry vision with nausea.  EXAM: CHEST  2 VIEW  COMPARISON:  None.  FINDINGS: Stable borderline cardiomegaly. No aortic aneurysm. No alveolar consolidation or pneumothorax. No pleural effusion. No overt pulmonary edema. The visualized skeletal structures are unremarkable.  IMPRESSION: No active  cardiopulmonary disease.  Electronically Signed   By: Ashley Royalty M.D.   On: 12/15/2016 20:16  Complexity Note: Imaging results reviewed. Results shared with [name], using Layman's terms.                         Meds   Current Outpatient Medications:  .  albuterol (PROVENTIL HFA;VENTOLIN HFA) 108 (90 Base) MCG/ACT inhaler, Inhale 2 puffs into the lungs every 6 (six) hours as needed for wheezing or shortness of breath., Disp: , Rfl:  .  cholecalciferol (VITAMIN D) 1000 units tablet, Take 2,000 Units by mouth daily., Disp: , Rfl:  .  citalopram (CELEXA) 20 MG tablet, Take 20-30 mg by mouth daily. , Disp: , Rfl:  .  diclofenac sodium (VOLTAREN) 1 % GEL, Apply 4 g topically every 8 (eight) hours as needed., Disp: 100 g, Rfl: 5 .  dicyclomine (BENTYL) 10 MG capsule, Take 10 mg by mouth 4 (four) times daily. , Disp: , Rfl:  .  fluticasone (FLONASE) 50 MCG/ACT nasal spray, Place 1-2 sprays into both nostrils daily. , Disp: , Rfl:  .  Folate-B12-Intrinsic Factor (INTRINSI B12-FOLATE) 606-301-60 MCG-MCG-MG TABS, Take 1 tablet by mouth daily. Reported on 04/06/2015, Disp: , Rfl:  .  furosemide (LASIX) 40 MG tablet, Take 1 tablet (40 mg total) by mouth 2 (two) times daily., Disp: 60 tablet, Rfl: 0 .  gabapentin (NEURONTIN) 300 MG capsule, Take 600 mg by mouth 4 (four) times daily. , Disp: , Rfl:  .  insulin detemir (LEVEMIR) 100 UNIT/ML injection, Inject 0.25 mLs (25 Units total) into the skin 2 (two) times daily., Disp: 100 mL, Rfl: 1 .  ipratropium-albuterol (DUONEB) 0.5-2.5 (3) MG/3ML SOLN, Take 3 mLs by nebulization every 4 (four) hours as needed. For shortness of breath/wheezing, Disp: , Rfl:  .  lactulose (CHRONULAC) 10 GM/15ML solution, Take 30 mLs (20 g total) by mouth 3 (three) times daily., Disp: 1892 mL, Rfl: 0 .  lidocaine (LIDODERM) 5 %, Place 1 patch onto the skin daily. Remove & Discard patch within 12 hours or as directed by MD, Disp: 30 patch, Rfl: 0 .  nystatin (MYCOSTATIN/NYSTOP)  powder, Apply 1 g topically 3 (three) times daily as needed. For irritation, Disp: 30 g, Rfl: 0 .  pantoprazole (PROTONIX) 40 MG tablet, Take 40 mg by mouth 2 (two) times daily before a meal., Disp: , Rfl:  .  potassium chloride SA (K-DUR,KLOR-CON) 20 MEQ tablet, Take 10-20 mEq by mouth daily as needed. For high blood pressure, Disp: , Rfl:  .  promethazine (PHENERGAN) 12.5 MG tablet, Take 2 tablets (25 mg total) by mouth every 6 (six) hours as needed for nausea or vomiting., Disp: 30 tablet, Rfl: 0 .  rifaximin (XIFAXAN) 550 MG TABS tablet, Take 550 mg by mouth 2 (two) times daily. , Disp: , Rfl:  .  rizatriptan (MAXALT) 10 MG tablet, Take 10 mg by mouth as needed for migraine. , Disp: , Rfl:  .  spironolactone (ALDACTONE) 100 MG tablet, Take 100 mg by mouth daily. , Disp: , Rfl:  .  vitamin B-12 (CYANOCOBALAMIN) 1000 MCG tablet, Take 2,000 mcg by mouth daily., Disp: , Rfl:   ROS  Constitutional: Denies any fever or chills Gastrointestinal: No reported hemesis, hematochezia, vomiting, or acute GI distress Musculoskeletal: Denies any acute onset joint swelling, redness, loss of ROM, or weakness Neurological: No reported episodes of acute onset apraxia, aphasia, dysarthria, agnosia, amnesia, paralysis, loss of coordination, or loss of consciousness  Allergies  Lori Mckenzie is allergic to tape and vicodin [hydrocodone-acetaminophen].  PFSH  Drug: Lori Mckenzie  reports that she does not use drugs. Alcohol:  reports that she does not drink alcohol. Tobacco:  reports that  has never smoked. she has never used smokeless tobacco. Medical:  has a past medical history of Abdominal abscess (08/25/2014), Acid reflux (08/10/2010), Acute cervical myofascial strain (02/09/2016), Anxiety, Ascites, Asthma, Back pain, Bile leak, postoperative (07/17/2014), Brittle bone disease, Cancer ([place]), Cervical disc disease, Chronic kidney disease, Collagen vascular disease ([place]), COPD (chronic obstructive pulmonary  disease) ([place]),  Diabetes mellitus without complication ([place]), GERD (gastroesophageal reflux disease), Hypertension, Hypothyroidism, Left upper quadrant pain (01/09/2014), Major depressive disorder with single episode (12/05/2011), Major depressive disorder, single episode (12/05/2011), Migraines, NASH (nonalcoholic steatohepatitis), Respiratory infection, Shock ([place]) (09/18/2014), Sleep apnea, Sleep apnea, Syncope (11/16/2014), Thyroid disease, and TIA (transient ischemic attack). Surgical: Lori Mckenzie  has a past surgical history that includes Abdominal hysterectomy; Tubal ligation; Colonoscopy with propofol (N/A, 06/23/2014); Esophagogastroduodenoscopy (N/A, 06/23/2014); Cholecystectomy (N/A, 07/15/2014); ERCP (N/A, 07/16/2014); Wisdom tooth extraction; ERCP (N/A, 10/03/2014); Esophagogastroduodenoscopy (egd) with propofol (N/A, 12/01/2015); and Esophagogastroduodenoscopy (N/A, 06/30/2016). Family: family history includes Emphysema in her father; Heart disease in her brother and sister; Lung cancer in her mother; Ulcers in her father and sister.  Constitutional Exam  General appearance: Well nourished, well developed, and well hydrated. In no apparent acute distress There were no vitals filed for this visit. BMI Assessment: Estimated body mass index is 46.46 kg/m as calculated from the following:   Height as of 12/20/16: _0  (1.575 m).   Weight as of 12/20/16: 254 lb (115.2 kg).  BMI interpretation table: BMI level Category Range association with higher incidence of chronic pain  <18 kg/m2 Underweight   18.5-24.9 kg/m2 Ideal body weight   25-29.9 kg/m2 Overweight Increased incidence by 20%  30-34.9 kg/m2 Obese (Class I) Increased incidence by 68%  35-39.9 kg/m2 Severe obesity (Class II) Increased incidence by 136%  >40 kg/m2 Extreme obesity (Class III) Increased incidence by 254%   BMI Readings from Last 4 Encounters:  12/20/16 46.46 kg/m  12/15/16 46.46 kg/m  12/06/16 47.55 kg/m  11/21/16 49.38 kg/m   Wt  Readings from Last 4 Encounters:  12/20/16 254 lb (115.2 kg)  12/15/16 254 lb (115.2 kg)  12/06/16 260 lb (117.9 kg)  11/21/16 270 lb (122.5 kg)  Psych/Mental status: Alert, oriented x 3 (person, place, & time)       Eyes: PERLA Respiratory: No evidence of acute respiratory distress  Cervical Spine Area Exam  Skin & Axial Inspection: No masses, redness, edema, swelling, or associated skin lesions Alignment: Symmetrical Functional ROM: Unrestricted ROM      Stability: No instability detected Muscle Tone/Strength: Functionally intact. No obvious neuro-muscular anomalies detected. Sensory (Neurological): Unimpaired Palpation: No palpable anomalies              Upper Extremity (UE) Exam    Side: Right upper extremity  Side: Left upper extremity  Skin & Extremity Inspection: Skin color, temperature, and hair growth are WNL. No peripheral edema or cyanosis. No masses, redness, swelling, asymmetry, or associated skin lesions. No contractures.  Skin & Extremity Inspection: Skin color, temperature, and hair growth are WNL. No peripheral edema or cyanosis. No masses, redness, swelling, asymmetry, or associated skin lesions. No contractures.  Functional ROM: Unrestricted ROM          Functional ROM: Unrestricted ROM          Muscle Tone/Strength: Functionally intact. No obvious neuro-muscular anomalies detected.  Muscle Tone/Strength: Functionally intact. No obvious neuro-muscular anomalies detected.  Sensory (Neurological): Unimpaired          Sensory (Neurological): Unimpaired          Palpation: No palpable anomalies              Palpation: No palpable anomalies              Specialized Test(s): Deferred         Specialized Test(s): Deferred  Thoracic Spine Area Exam  Skin & Axial Inspection: No masses, redness, or swelling Alignment: Symmetrical Functional ROM: Unrestricted ROM Stability: No instability detected Muscle Tone/Strength: Functionally intact. No obvious neuro-muscular  anomalies detected. Sensory (Neurological): Unimpaired Muscle strength & Tone: No palpable anomalies  Lumbar Spine Area Exam  Skin & Axial Inspection: No masses, redness, or swelling Alignment: Symmetrical Functional ROM: Unrestricted ROM      Stability: No instability detected Muscle Tone/Strength: Functionally intact. No obvious neuro-muscular anomalies detected. Sensory (Neurological): Unimpaired Palpation: No palpable anomalies       Provocative Tests: Lumbar Hyperextension and rotation test: evaluation deferred today       Lumbar Lateral bending test: evaluation deferred today       Patrick's Maneuver: evaluation deferred today                    Gait & Posture Assessment  Ambulation: Unassisted Gait: Relatively normal for age and body habitus Posture: WNL   Lower Extremity Exam    Side: Right lower extremity  Side: Left lower extremity  Skin & Extremity Inspection: Skin color, temperature, and hair growth are WNL. No peripheral edema or cyanosis. No masses, redness, swelling, asymmetry, or associated skin lesions. No contractures.  Skin & Extremity Inspection: Skin color, temperature, and hair growth are WNL. No peripheral edema or cyanosis. No masses, redness, swelling, asymmetry, or associated skin lesions. No contractures.  Functional ROM: Unrestricted ROM          Functional ROM: Unrestricted ROM          Muscle Tone/Strength: Functionally intact. No obvious neuro-muscular anomalies detected.  Muscle Tone/Strength: Functionally intact. No obvious neuro-muscular anomalies detected.  Sensory (Neurological): Unimpaired  Sensory (Neurological): Unimpaired  Palpation: No palpable anomalies  Palpation: No palpable anomalies   Assessment  Primary Diagnosis & Pertinent Problem List: There were no encounter diagnoses.  Status Diagnosis  Controlled Controlled Controlled No diagnosis found.  Problems updated and reviewed during this visit: No problems updated. Plan of Care   Pharmacotherapy (Medications Ordered): No orders of the defined types were placed in this encounter.  Medications administered today: Braya Habermehl had no medications administered during this visit.  Procedure Orders    No procedure(s) ordered today   Lab Orders  No laboratory test(s) ordered today   Imaging Orders  No imaging studies ordered today   Referral Orders  No referral(s) requested today    Interventional management options: Planned, scheduled, and/or pending:   ***   Considering:   Diagnostic bilateral cervical facetblock Diagnostic L1-2 lumbar epidural steroid injection Diagnostic, right-sided L4-5 lumbar epiduralsteroid injection Diagnostic bilateral intra-articular shoulderjoint injection Diagnostic bilateral suprascapular nerveblock Possible bilateral suprascapular nerve radiofrequencyablation Palliative left-sided L5-S1 lumbar epiduralsteroid injection  Diagnostic left S1 selective nerveroot block Diagnostic right-sided cervical epiduralsteroid injection Diagnostic T10-11 thoracic epiduralsteroid injection Diagnostic bilateral intra-articular hipjoint injection Possible bilateral hip radiofrequencyablation   Palliative PRN treatment(s):   Diagnostic L1-2 lumbar epidural steroidinjection Diagnostic, right-sided L4-5 lumbar epidural steroidinjection Diagnostic T10-11 thoracic epidural steroidinjection    Provider-requested follow-up: No Follow-up on file.  Future Appointments  Date Time Provider [place]  12/21/2016 10:15 AM Milinda Pointer, MD [facility] None   Primary Care Physician: Ardine Eng, MD Location: [facility] Note by: Gaspar Cola, MD Date: 12/21/2016; Time: 3:16 PM

## 2016-12-20 NOTE — ED Provider Notes (Signed)
[facility] Emergency Department Provider Note  ____________________________________________   First MD Initiated Contact with Patient 12/20/16 1606     (approximate)  I have reviewed the triage vital signs and the nursing notes.   HISTORY  Chief Complaint Chest Pain   HPI Lori Mckenzie is a 51 y.o. female with a history of bile duct stenting, diabetes, chronic kidney disease who is presenting to the emergency department chest pain.  She says the chest pain has been ongoing for about 2 hours and is about an 8-1/2 out of 10 at this time.  She says it is radiating through to her back.  It is associated with nausea as well as shortness of breath.  She says the chest pain is stabbing in quality.  Says that her sugars have also been running high over the past 2 weeks and that she has aches and pains when her sugars run high.  Says that she also becomes drowsy which she is feeling at this time.  Reports the chest pain is been constant.   Past Medical History:  Diagnosis Date  . Abdominal abscess 08/25/2014  . Acid reflux 08/10/2010  . Acute cervical myofascial strain 02/09/2016  . Anxiety   . Ascites   . Asthma   . Back pain   . Bile leak, postoperative 07/17/2014  . Brittle bone disease   . Cancer ([place])    Uteriine  ca 85yr ago partial hysterectomy  . Cervical disc disease   . Chronic kidney disease   . Collagen vascular disease ([place]    RA  3-4 yrs ago  . COPD (chronic obstructive pulmonary disease) ([place]   . Diabetes mellitus without complication ([place]   . GERD (gastroesophageal reflux disease)   . Hypertension   . Hypothyroidism   . Left upper quadrant pain 01/09/2014  . Major depressive disorder with single episode 12/05/2011  . Major depressive disorder, single episode 12/05/2011  . Migraines   . NASH (nonalcoholic steatohepatitis)   . Respiratory infection    2/17  . Shock ([place] 09/18/2014  . Sleep apnea   . Sleep apnea   . Syncope 11/16/2014  .  Thyroid disease   . TIA (transient ischemic attack)     Patient Active Problem List   Diagnosis Date Noted  . Chronic feet pain (Secondary source of pain) (Bilateral) (R>L) 10/26/2016  . Acute hepatic encephalopathy 09/23/2016  . Acute postoperative pain 08/09/2016  . Low back pain due to L1-2 disc extrusion (caudal) (Left) 07/11/2016  . Abdominal pain 06/28/2016  . Constipation 06/20/2016  . Hyperglycemia 06/20/2016  . Nausea without vomiting 06/06/2016  . Chronic lower extremity pain (Bilateral) 05/19/2016  . Spinal stenosis, thoracic region (T10-11) 05/19/2016  . Diabetes mellitus, insulin dependent (IDDM), uncontrolled ([place] 04/12/2016  . Chronic sacroiliac joint pain (Bilateral) (L>R) 03/23/2016  . Chronic upper extremity pain (Left) 03/10/2016  . Chronic radicular cervical pain (L) 03/10/2016  . Osteoarthritis 02/24/2016  . Allodynia 02/09/2016  . Chronic pain syndrome 01/07/2016  . Opiate withdrawal ([place] 12/22/2015  . Elevated liver enzymes 09/28/2015  . Cirrhosis of liver without ascites ([place] 09/24/2015  . Cirrhosis ([place] 09/24/2015  . Altered mental status 07/24/2015  . Uncontrolled diabetes mellitus ([place] 07/24/2015  . Radicular pain of thoracic region 04/06/2015  . Hepatic encephalopathy ([place] 03/11/2015  . Elevated sedimentation rate 03/05/2015  . Elevated C-reactive protein (CRP) 03/05/2015  . Lumbar facet syndrome (Bilateral) (R>L) 03/05/2015  . Cervical spondylosis 03/05/2015  . Lumbar spondylosis 03/05/2015  .  Encounter for chronic pain management 03/05/2015  . Chronic shoulder pain (Bilateral) (R>L) 03/05/2015  . Chronic carpal tunnel syndrome (Bilateral) 03/05/2015  . Chronic hip pain (Bilateral) (L>R) 03/05/2015  . Chronic upper back pain (Bilateral) (L>R) 03/05/2015  . Osteoporosis, idiopathic 03/05/2015  . Abnormal MRI, lumbar spine (02/03/2015) 03/05/2015  . Thoracic radiculitis 02/05/2015  . Vitamin D deficiency 01/13/2015  . B12 deficiency  01/13/2015  . Folate deficiency 01/13/2015  . Subacute lumbar radiculopathy (left side) (S1 dermatome) 12/10/2014  . Drowsiness 11/16/2014  . Episode of syncope 11/16/2014  . Somnolence 11/16/2014  . Opiate use (75 MME/Day) 10/28/2014  . Long term prescription opiate use 10/28/2014  . Long term current use of opiate analgesic 10/28/2014  . Encounter for therapeutic drug level monitoring 10/28/2014  . Chronic epigastric abdominal pain 10/28/2014  . Chronic low back pain (Primary Source of Pain) (Bilateral) (R>L) 10/28/2014  . Chronic neck pain [facility] source of pain) (Bilateral) (R>L) 10/28/2014  . Ascites 09/05/2014  . NASH (nonalcoholic steatohepatitis) 09/05/2014  . Hypokalemia 09/05/2014  . Dysthymia 08/05/2014  . Other social stressor 08/05/2014  . Steatohepatitis 07/15/2014  . Type 2 diabetes mellitus ([place]) 07/12/2014  . COPD with acute exacerbation ([place]) 07/12/2014  . GERD (gastroesophageal reflux disease) 07/12/2014  . OSA on CPAP 07/12/2014  . Anxiety 07/12/2014  . 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 Lumbar and sacral osteoarthritis 03/26/2014  . Myofascial pain 03/26/2014  . Lumbar spinal stenosis 03/26/2014  . Lumbosacral spondylosis without myelopathy 03/26/2014  . 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 Lumbar central spinal stenosis (L1-2 and L4-5) 03/26/2014  . Spondylosis of lumbar region without myelopathy or radiculopathy 03/26/2014  . Breath shortness 01/09/2014  . Diastolic dysfunction 23/53/6144  . Airway hyperreactivity 08/06/2013  . Essential (primary) hypertension 08/06/2013  . Asthma 08/06/2013  . Clinical depression 12/05/2011    Past Surgical History:  Procedure Laterality Date  . ABDOMINAL HYSTERECTOMY    . CHOLECYSTECTOMY N/A 07/15/2014   Procedure: LAPAROSCOPIC CHOLECYSTECTOMY with liver biopsy ;  Surgeon: Sherri Rad, MD;  Location: [facility] ORS;  Service: General;  Laterality: N/A;  . COLONOSCOPY WITH PROPOFOL N/A 06/23/2014    Procedure: COLONOSCOPY WITH PROPOFOL;  Surgeon: Lollie Sails, MD;  Location: [facility] ENDOSCOPY;  Service: Endoscopy;  Laterality: N/A;  . ERCP N/A 07/16/2014   Procedure: ENDOSCOPIC RETROGRADE CHOLANGIOPANCREATOGRAPHY (ERCP);  Surgeon: Clarene Essex, MD;  Location: [facility] ENDOSCOPY;  Service: Endoscopy;  Laterality: N/A;  . ERCP N/A 10/03/2014   Procedure: ENDOSCOPIC RETROGRADE CHOLANGIOPANCREATOGRAPHY (ERCP);  Surgeon: Hulen Luster, MD;  Location: [facility] ENDOSCOPY;  Service: Gastroenterology;  Laterality: N/A;  . ESOPHAGOGASTRODUODENOSCOPY N/A 06/23/2014   Procedure: ESOPHAGOGASTRODUODENOSCOPY (EGD);  Surgeon: Lollie Sails, MD;  Location: [facility] ENDOSCOPY;  Service: Endoscopy;  Laterality: N/A;  . ESOPHAGOGASTRODUODENOSCOPY N/A 06/30/2016   Procedure: ESOPHAGOGASTRODUODENOSCOPY (EGD);  Surgeon: Jonathon Bellows, MD;  Location: [facility] ENDOSCOPY;  Service: Endoscopy;  Laterality: N/A;  . ESOPHAGOGASTRODUODENOSCOPY (EGD) WITH PROPOFOL N/A 12/01/2015   Procedure: ESOPHAGOGASTRODUODENOSCOPY (EGD) WITH PROPOFOL;  Surgeon: Lollie Sails, MD;  Location: [facility] ENDOSCOPY;  Service: Endoscopy;  Laterality: N/A;  . TUBAL LIGATION    . WISDOM TOOTH EXTRACTION      Prior to Admission medications   Medication Sig Start Date End Date Taking? Authorizing Provider  albuterol (PROVENTIL HFA;VENTOLIN HFA) 108 (90 Base) MCG/ACT inhaler Inhale 2 puffs into the lungs every 6 (six) hours as needed for wheezing or shortness of breath.    [provider]  cholecalciferol (VITAMIN D) 1000 units tablet  Take 2,000 Units by mouth daily.    [provider]  citalopram (CELEXA) 20 MG tablet Take 20-30 mg by mouth daily.  07/24/15   [provider]  diclofenac sodium (VOLTAREN) 1 % GEL Apply 4 g topically every 8 (eight) hours as needed. 12/06/16 06/04/17  Milinda Pointer, MD  dicyclomine (BENTYL) 10 MG capsule Take 10 mg by mouth 4 (four) times daily.     [provider]  fluticasone (FLONASE) 50 MCG/ACT  nasal spray Place 1-2 sprays into both nostrils daily.     [provider]  Folate-B12-Intrinsic Factor (INTRINSI B12-FOLATE) 161-096-04 MCG-MCG-MG TABS Take 1 tablet by mouth daily. Reported on 04/06/2015    [provider]  furosemide (LASIX) 40 MG tablet Take 1 tablet (40 mg total) by mouth 2 (two) times daily. 10/26/16   Nicholes Mango, MD  gabapentin (NEURONTIN) 300 MG capsule Take 600 mg by mouth 4 (four) times daily.     [provider]  insulin detemir (LEVEMIR) 100 UNIT/ML injection Inject 0.25 mLs (25 Units total) into the skin 2 (two) times daily. 10/26/16   Gouru, Illene Silver, MD  ipratropium-albuterol (DUONEB) 0.5-2.5 (3) MG/3ML SOLN Take 3 mLs by nebulization every 4 (four) hours as needed. For shortness of breath/wheezing    [provider]  lactulose (CHRONULAC) 10 GM/15ML solution Take 30 mLs (20 g total) by mouth 3 (three) times daily. 10/26/16   Gouru, Illene Silver, MD  lidocaine (LIDODERM) 5 % Place 1 patch onto the skin daily. Remove & Discard patch within 12 hours or as directed by MD 12/16/16   Gregor Hams, MD  nystatin (MYCOSTATIN/NYSTOP) powder Apply 1 g topically 3 (three) times daily as needed. For irritation 10/26/16   Gouru, Illene Silver, MD  pantoprazole (PROTONIX) 40 MG tablet Take 40 mg by mouth 2 (two) times daily before a meal.    [provider]  potassium chloride SA (K-DUR,KLOR-CON) 20 MEQ tablet Take 10-20 mEq by mouth daily as needed. For high blood pressure    [provider]  promethazine (PHENERGAN) 12.5 MG tablet Take 2 tablets (25 mg total) by mouth every 6 (six) hours as needed for nausea or vomiting. 09/25/16   Dustin Flock, MD  rifaximin (XIFAXAN) 550 MG TABS tablet Take 550 mg by mouth 2 (two) times daily.     [provider]  rizatriptan (MAXALT) 10 MG tablet Take 10 mg by mouth as needed for migraine.     [provider]  spironolactone (ALDACTONE) 100 MG tablet Take 100 mg by mouth daily.      [provider]  vitamin B-12 (CYANOCOBALAMIN) 1000 MCG tablet Take 2,000 mcg by mouth daily.    [provider]    Allergies Tape and Vicodin [hydrocodone-acetaminophen]  Family History  Problem Relation Age of Onset  . Lung cancer Mother   . Ulcers Father   . Emphysema Father   . Heart disease Sister   . Ulcers Sister   . Heart disease Brother     Social History Social History   Tobacco Use  . Smoking status: Never Smoker  . Smokeless tobacco: Never Used  Substance Use Topics  . Alcohol use: No    Comment: occ  . Drug use: No    Review of Systems  Constitutional: No fever/chills Eyes: No visual changes. ENT: No sore throat. Cardiovascular: As above Respiratory: Denies shortness of breath. Gastrointestinal: No abdominal pain.  No nausea, no vomiting.  No diarrhea.  No constipation. Genitourinary: Negative for dysuria. Musculoskeletal:  As above Skin: Negative for rash. Neurological: Negative for headaches, focal weakness or numbness.   ____________________________________________   PHYSICAL EXAM:  VITAL SIGNS: ED Triage Vitals [12/20/16 1433]  Enc Vitals Group     BP 139/85     Pulse Rate (!) 103     Resp 18     Temp 98.4 F (36.9 C)     Temp Source Oral     SpO2 97 %     Weight 254 lb (115.2 kg)     Height 5' 2"  (1.575 m)     Head Circumference      Peak Flow      Pain Score 9     Pain Loc      Pain Edu?      Excl. in [place]?     Constitutional: Alert and oriented. in no acute distress.  Appears drowsy but answers all questions appropriately and is fully oriented. Eyes: Conjunctivae are normal.  Head: Atraumatic. Nose: No congestion/rhinnorhea. Mouth/Throat: Mucous membranes are moist.  Neck: No stridor.   Cardiovascular: Normal rate, regular rhythm. Grossly normal heart sounds.  Good peripheral circulation with equal and bilateral radial pulses.  Chest pain is reproducible to palpation over the mid sternum. Respiratory: Normal  respiratory effort.  No retractions. Lungs CTAB. Gastrointestinal: Soft and nontender. No distention. No CVA tenderness. Musculoskeletal: No lower extremity tenderness nor edema.  No joint effusions.  Reproducible back pain to the midline thoracic region.  No deformity or step-off or bruising. Neurologic:  Normal speech and language. No gross focal neurologic deficits are appreciated. Skin:  Skin is warm, dry and intact. No rash noted. Psychiatric: Mood and affect are normal. Speech and behavior are normal.  ____________________________________________   LABS (all labs ordered are listed, but only abnormal results are displayed)  Labs Reviewed  BASIC METABOLIC PANEL - Abnormal; Notable for the following components:      Result Value   Sodium 131 (*)    Chloride 99 (*)    Glucose, Bld 489 (*)    All other components within normal limits  CBC - Abnormal; Notable for the following components:   MCV 100.7 (*)    Platelets 74 (*)    All other components within normal limits  HEPATIC FUNCTION PANEL - Abnormal; Notable for the following components:   Total Protein 6.1 (*)    AST 52 (*)    ALT 70 (*)    Alkaline Phosphatase 129 (*)    All other components within normal limits  GLUCOSE, CAPILLARY - Abnormal; Notable for the following components:   Glucose-Capillary 317 (*)    All other components within normal limits  TROPONIN I  AMMONIA  LIPASE, BLOOD  TROPONIN I  POC URINE PREG, ED   ____________________________________________  EKG  ED ECG REPORT I, Doran Stabler, the attending physician, personally viewed and interpreted this ECG.   Date: 12/20/2016  EKG Time: 1431  Rate: 103  Rhythm: sinus tachycardia  Axis: Normal  Intervals:none  ST&T Change: No ST segment elevation or depression.  However, there is voltage criteria present for LVH.  ____________________________________________  RADIOLOGY  No acute  process ____________________________________________   PROCEDURES  Procedure(s) performed:   Procedures  Critical Care performed:   ____________________________________________   INITIAL IMPRESSION / ASSESSMENT AND PLAN / ED COURSE  Pertinent labs & imaging results that were available during my care of the patient were reviewed by me and considered in my medical decision making (see chart for details).  Differential diagnosis  includes, but is not limited to, ACS, aortic dissection, pulmonary embolism, cardiac tamponade, pneumothorax, pneumonia, pericarditis, myocarditis, GI-related causes including esophagitis/gastritis, and musculoskeletal chest wall pain.   As part of my medical decision making, I reviewed the following data within the electronic MEDICAL RECORD NUMBER Notes from prior ED visits    ----------------------------------------- 8:19 PM on 12/20/2016 -----------------------------------------  Patient with persistent chest pain despite glucose being decreased.  Given 2 nitro tabs and pain is now gone.  Patient is refusing aspirin because she says that she has been told that she cannot have NSAIDs in the past although does not of the specific reason.  Patient with a heart score of 4.  Patient will be admitted for observation for chest pain.  She is aware of the plan.  Signed out to Dr.Kalisetti.   ____________________________________________   FINAL CLINICAL IMPRESSION(S) / ED DIAGNOSES  Chest pain    NEW MEDICATIONS STARTED DURING THIS VISIT:  This SmartLink is deprecated. Use AVSMEDLIST instead to display the medication list for a patient.   Note:  This document was prepared using Dragon voice recognition software and may include unintentional dictation errors.     Orbie Pyo, MD 12/20/16 2020

## 2016-12-20 NOTE — ED Notes (Signed)
Admitting MD at bedside.

## 2016-12-20 NOTE — ED Notes (Signed)
Pt using phone to call son

## 2016-12-20 NOTE — H&P (Signed)
[facility] [place] NAME: Lori Mckenzie    MR#:  [id number]  DATE OF BIRTH:  August 28, 1965  DATE OF ADMISSION:  12/20/2016  PRIMARY CARE PHYSICIAN: Ashkin, Neldon Labella, MD   REQUESTING/REFERRING PHYSICIAN: Clearnce Hasten, MD  CHIEF COMPLAINT:   Chief Complaint  Patient presents with  . Chest Pain    HISTORY OF PRESENT ILLNESS:  Lori Mckenzie  is a 51 y.o. female who presents with chest pain.  Patient states that she developed this pain this afternoon while she was out with her husband in his doctor's office.  She states that the pain was unrelenting and associated with some shortness of breath so she came to the ED for evaluation.  Here she initially had relatively normal workup in terms of labs and imaging, including a normal second troponin.  However, she still had some discomfort, so hospitalist were called for admission  PAST MEDICAL HISTORY:   Past Medical History:  Diagnosis Date  . Abdominal abscess 08/25/2014  . Acid reflux 08/10/2010  . Acute cervical myofascial strain 02/09/2016  . Anxiety   . Ascites   . Asthma   . Back pain   . Bile leak, postoperative 07/17/2014  . Brittle bone disease   . Cancer ([place])    Uteriine  ca 72yr ago partial hysterectomy  . Cervical disc disease   . Chronic kidney disease   . Collagen vascular disease ([place]    RA  3-4 yrs ago  . COPD (chronic obstructive pulmonary disease) ([place]   . Diabetes mellitus without complication ([place]   . GERD (gastroesophageal reflux disease)   . Hypertension   . Hypothyroidism   . Left upper quadrant pain 01/09/2014  . Major depressive disorder with single episode 12/05/2011  . Major depressive disorder, single episode 12/05/2011  . Migraines   . NASH (nonalcoholic steatohepatitis)   . Respiratory infection    2/17  . Shock ([place] 09/18/2014  . Sleep apnea   . Sleep apnea   . Syncope 11/16/2014  . Thyroid disease   . TIA (transient ischemic attack)     PAST SURGICAL  HISTORY:   Past Surgical History:  Procedure Laterality Date  . ABDOMINAL HYSTERECTOMY    . CHOLECYSTECTOMY N/A 07/15/2014   Procedure: LAPAROSCOPIC CHOLECYSTECTOMY with liver biopsy ;  Surgeon: MSherri Rad MD;  Location: [facility] ORS;  Service: General;  Laterality: N/A;  . COLONOSCOPY WITH PROPOFOL N/A 06/23/2014   Procedure: COLONOSCOPY WITH PROPOFOL;  Surgeon: MLollie Sails MD;  [facility];  Service: Endoscopy;  Laterality: N/A;  . ERCP N/A 07/16/2014   Procedure: ENDOSCOPIC RETROGRADE CHOLANGIOPANCREATOGRAPHY (ERCP);  Surgeon: MClarene Essex MD;  [facility];  Service: Endoscopy;  Laterality: N/A;  . ERCP N/A 10/03/2014   Procedure: ENDOSCOPIC RETROGRADE CHOLANGIOPANCREATOGRAPHY (ERCP);  Surgeon: PHulen Luster MD;  [facility];  Service: Gastroenterology;  Laterality: N/A;  . ESOPHAGOGASTRODUODENOSCOPY N/A 06/23/2014   Procedure: ESOPHAGOGASTRODUODENOSCOPY (EGD);  Surgeon: MLollie Sails MD;  [facility];  Service: Endoscopy;  Laterality: N/A;  . ESOPHAGOGASTRODUODENOSCOPY N/A 06/30/2016   Procedure: ESOPHAGOGASTRODUODENOSCOPY (EGD);  Surgeon: [name] MD;  [facility];  Service: Endoscopy;  Laterality: N/A;  . ESOPHAGOGASTRODUODENOSCOPY (EGD) WITH PROPOFOL N/A 12/01/2015   Procedure: ESOPHAGOGASTRODUODENOSCOPY (EGD) WITH PROPOFOL;  Surgeon: MLollie Sails MD;  [facility];  Service: Endoscopy;  Laterality: N/A;  . TUBAL LIGATION    . WISDOM TOOTH EXTRACTION      SOCIAL HISTORY:   Social  History   Tobacco Use  . Smoking status: Never Smoker  . Smokeless tobacco: Never Used  Substance Use Topics  . Alcohol use: No    Comment: occ    FAMILY HISTORY:   Family History  Problem Relation Age of Onset  . Lung cancer Mother   . Ulcers Father   . Emphysema Father   . Heart disease Sister   . Ulcers Sister   . Heart disease Brother     DRUG ALLERGIES:   Allergies  Allergen Reactions  . Tape  Swelling  . Vicodin [Hydrocodone-Acetaminophen] Itching and Rash    MEDICATIONS AT HOME:   Prior to Admission medications   Medication Sig Start Date End Date Taking? Authorizing Provider  cholecalciferol (VITAMIN D) 1000 units tablet Take 2,000 Units by mouth daily.   Yes [provider]  citalopram (CELEXA) 40 MG tablet Take by mouth daily.  07/24/15  Yes [provider]  diclofenac sodium (VOLTAREN) 1 % GEL Apply 4 g topically every 8 (eight) hours as needed. 12/06/16 06/04/17 Yes Milinda Pointer, MD  fluticasone (FLONASE) 50 MCG/ACT nasal spray Place 1-2 sprays into both nostrils daily.    Yes [provider]  Folate-B12-Intrinsic Factor (INTRINSI B12-FOLATE) 419-622-29 MCG-MCG-MG TABS Take 1 tablet by mouth daily. Reported on 04/06/2015   Yes [provider]  furosemide (LASIX) 40 MG tablet Take 1 tablet (40 mg total) by mouth 2 (two) times daily. Patient taking differently: Take 40 mg by mouth daily.  10/26/16  Yes Gouru, Illene Silver, MD  gabapentin (NEURONTIN) 300 MG capsule Take 600 mg by mouth 4 (four) times daily.    Yes [provider]  insulin detemir (LEVEMIR) 100 UNIT/ML injection Inject 0.25 mLs (25 Units total) into the skin 2 (two) times daily. Patient taking differently: Inject 50 Units into the skin 2 (two) times daily.  10/26/16  Yes Gouru, Illene Silver, MD  lactulose (CHRONULAC) 10 GM/15ML solution Take 30 mLs (20 g total) by mouth 3 (three) times daily. 10/26/16  Yes Gouru, Aruna, MD  lidocaine (LIDODERM) 5 % Place 1 patch onto the skin daily. Remove & Discard patch within 12 hours or as directed by MD 12/16/16  Yes Gregor Hams, MD  nystatin (MYCOSTATIN/NYSTOP) powder Apply 1 g topically 3 (three) times daily as needed. For irritation 10/26/16  Yes Gouru, Aruna, MD  pantoprazole (PROTONIX) 40 MG tablet Take 40 mg by mouth 2 (two) times daily before a meal.   Yes [provider]  promethazine (PHENERGAN) 12.5 MG tablet Take 2  tablets (25 mg total) by mouth every 6 (six) hours as needed for nausea or vomiting. 09/25/16  Yes Dustin Flock, MD  rifaximin (XIFAXAN) 550 MG TABS tablet Take 550 mg by mouth 2 (two) times daily.    Yes [provider]  spironolactone (ALDACTONE) 100 MG tablet Take 100 mg by mouth daily.    Yes [provider]  vitamin B-12 (CYANOCOBALAMIN) 1000 MCG tablet Take 2,000 mcg by mouth daily.   Yes [provider]  albuterol (PROVENTIL HFA;VENTOLIN HFA) 108 (90 Base) MCG/ACT inhaler Inhale 2 puffs into the lungs every 6 (six) hours as needed for wheezing or shortness of breath.    [provider]  ipratropium-albuterol (DUONEB) 0.5-2.5 (3) MG/3ML SOLN Take 3 mLs by nebulization every 4 (four) hours as needed. For shortness of breath/wheezing    [provider]  potassium chloride SA (K-DUR,KLOR-CON) 20 MEQ tablet Take 10-20 mEq by mouth daily as needed. For high blood pressure  [provider]  rizatriptan (MAXALT) 10 MG tablet Take 10 mg by mouth as needed for migraine.     [provider]    REVIEW OF SYSTEMS:  Review of Systems  Constitutional: Negative for chills, fever, malaise/fatigue and weight loss.  HENT: Negative for ear pain, hearing loss and tinnitus.   Eyes: Negative for blurred vision, double vision, pain and redness.  Respiratory: Positive for shortness of breath. Negative for cough and hemoptysis.   Cardiovascular: Positive for chest pain. Negative for palpitations, orthopnea and leg swelling.  Gastrointestinal: Negative for abdominal pain, constipation, diarrhea, nausea and vomiting.  Genitourinary: Negative for dysuria, frequency and hematuria.  Musculoskeletal: Negative for back pain, joint pain and neck pain.  Skin:       No acne, rash, or lesions  Neurological: Negative for dizziness, tremors, focal weakness and weakness.  Endo/Heme/Allergies: Negative for polydipsia. Does not bruise/bleed easily.   Psychiatric/Behavioral: Negative for depression. The patient is not nervous/anxious and does not have insomnia.      VITAL SIGNS:   Vitals:   12/20/16 1623 12/20/16 1941 12/20/16 2003 12/20/16 2048  BP: (!) 138/94 136/89 116/67 111/74  Pulse: 98 85 86 75  Resp: 20 19 20 19   Temp:      TempSrc:      SpO2: 95% 96% 96% 96%  Weight:      Height:       Wt Readings from Last 3 Encounters:  12/20/16 115.2 kg (254 lb)  12/15/16 115.2 kg (254 lb)  12/06/16 117.9 kg (260 lb)    PHYSICAL EXAMINATION:  Physical Exam  Vitals reviewed. Constitutional: She is oriented to person, place, and time. She appears well-developed and well-nourished. No distress.  HENT:  Head: Normocephalic and atraumatic.  Mouth/Throat: Oropharynx is clear and moist.  Eyes: Conjunctivae and EOM are normal. Pupils are equal, round, and reactive to light. No scleral icterus.  Neck: Normal range of motion. Neck supple. No JVD present. No thyromegaly present.  Cardiovascular: Normal rate, regular rhythm and intact distal pulses. Exam reveals no gallop and no friction rub.  No murmur heard. Respiratory: Effort normal and breath sounds normal. No respiratory distress. She has no wheezes. She has no rales.  GI: Soft. Bowel sounds are normal. She exhibits no distension. There is no tenderness.  Musculoskeletal: Normal range of motion. She exhibits no edema.  No arthritis, no gout  Lymphadenopathy:    She has no cervical adenopathy.  Neurological: She is alert and oriented to person, place, and time. No cranial nerve deficit.  No dysarthria, no aphasia  Skin: Skin is warm and dry. No rash noted. No erythema.  Psychiatric: She has a normal mood and affect. Her behavior is normal. Judgment and thought content normal.    LABORATORY PANEL:   CBC Recent Labs  Lab 12/20/16 1431  WBC 5.6  HGB 15.6  HCT 46.1  PLT 74*    ------------------------------------------------------------------------------------------------------------------  Chemistries  Recent Labs  Lab 12/20/16 1431 12/20/16 1630  NA 131*  --   K 4.7  --   CL 99*  --   CO2 24  --   GLUCOSE 489*  --   BUN 11  --   CREATININE 0.72  --   CALCIUM 9.3  --   AST  --  52*  ALT  --  70*  ALKPHOS  --  129*  BILITOT  --  1.1   ------------------------------------------------------------------------------------------------------------------  Cardiac Enzymes Recent Labs  Lab 12/20/16 1818  TROPONINI <0.03   ------------------------------------------------------------------------------------------------------------------  RADIOLOGY:  Dg Chest 2 View  Result Date: 12/20/2016 CLINICAL DATA:  Central chest tightness.  Radiation down left arm. EXAM: CHEST  2 VIEW COMPARISON:  12/15/2016 . FINDINGS: Mediastinum and hilar structures normal. Stable cardiomegaly. No pulmonary infiltrate. Low lung volumes. No pleural effusion or pneumothorax. No acute bony abnormality IMPRESSION: 1. Stable cardiomegaly. 2. Low lung volumes.  No acute pulmonary infiltrate. Electronically Signed   By: Marcello Moores  Register   On: 12/20/2016 15:15    EKG:   Orders placed or performed during the hospital encounter of 12/20/16  . EKG 12-Lead  . EKG 12-Lead  . ED EKG within 10 minutes  . ED EKG within 10 minutes    IMPRESSION AND PLAN:  Principal Problem:   Chest pain -2 neg troponins thus far, will trend this out for 3 total sets.  Get an echocardiogram and a cardiology consult in the morning Active Problems:   OSA on CPAP -CPAP nightly   Diastolic dysfunction with chronic heart failure ([place]) -continue home meds   Essential (primary) hypertension -home dose antihypertensives   Diabetes mellitus, insulin dependent (IDDM), uncontrolled (H[facility]) -sliding scale insulin with corresponding glucose checks   GERD (gastroesophageal reflux disease) -home dose PPI   Anxiety  -home dose anxiolytics   Cirrhosis of liver without ascites ([place]) -avoid hepatotoxins  All the records are reviewed and case discussed with ED provider. Management plans discussed with the patient and/or family.  DVT PROPHYLAXIS: SubQ lovenox  GI PROPHYLAXIS: PPI  ADMISSION STATUS: Observation  CODE STATUS: Full Code Status History    Date Active Date Inactive Code Status Order ID Comments User Context   10/21/2016 19:29 10/26/2016 18:24 Full Code [id number]  Epifanio Lesches, MD ED   09/23/2016 19:15 09/25/2016 17:46 Full Code [id number]  Demetrios Loll, MD Inpatient   06/28/2016 21:22 06/30/2016 19:09 Full Code [id number]  Henreitta Leber, MD Inpatient   03/12/2015 00:09 03/13/2015 17:23 Full Code [id number]  Gladstone Lighter, MD Inpatient   10/04/2014 12:59 10/06/2014 20:20 Full Code [id number]  Baxter Hire, MD Inpatient   09/05/2014 02:29 09/08/2014 18:10 Full Code [id number]  Lance Coon, MD Inpatient   08/25/2014 18:23 08/30/2014 13:48 Full Code [id number]  Florene Glen, MD ED   08/03/2014 17:12 08/08/2014 15:16 Full Code [id number]  Marlyce Huge, MD ED   07/24/2014 18:45 07/30/2014 17:31 Full Code [id number]  Florene Glen, MD Inpatient   07/23/2014 11:42 07/24/2014 03:27 Full Code [id number]  Sabino Dick, Adeline   07/12/2014 23:25 07/16/2014 16:30 Full Code [id number]  Lance Coon, MD Inpatient      TOTAL TIME TAKING CARE OF THIS PATIENT: 40 minutes.   Harrell Niehoff Tyronza 12/20/2016, 9:20 PM  [employer]  [phone]  CC: Primary care physician; Ardine Eng, MD  Note:  This document was prepared using Dragon voice recognition software and may include unintentional dictation errors.

## 2016-12-21 ENCOUNTER — Ambulatory Visit: Payer: Medicaid Other | Admitting: Pain Medicine

## 2016-12-21 LAB — COMPREHENSIVE METABOLIC PANEL
ALT: 66 U/L — ABNORMAL HIGH (ref 14–54)
AST: 46 U/L — ABNORMAL HIGH (ref 15–41)
Albumin: 3.5 g/dL (ref 3.5–5.0)
Alkaline Phosphatase: 111 U/L (ref 38–126)
Anion gap: 6 (ref 5–15)
BUN: 9 mg/dL (ref 6–20)
CO2: 27 mmol/L (ref 22–32)
Calcium: 8.9 mg/dL (ref 8.9–10.3)
Chloride: 101 mmol/L (ref 101–111)
Creatinine, Ser: 0.57 mg/dL (ref 0.44–1.00)
GFR calc Af Amer: >60 mL/min (ref >60)
GFR calc non Af Amer: >60 mL/min (ref >60)
Glucose, Bld: 454 mg/dL — ABNORMAL HIGH (ref 65–99)
Potassium: 3.9 mmol/L (ref 3.5–5.1)
Sodium: 134 mmol/L — ABNORMAL LOW (ref 135–145)
Total Bilirubin: 1.3 mg/dL — ABNORMAL HIGH (ref 0.3–1.2)
Total Protein: 6.2 g/dL — ABNORMAL LOW (ref 6.5–8.1)

## 2016-12-21 LAB — GLUCOSE, CAPILLARY
Glucose-Capillary: 464 mg/dL — ABNORMAL HIGH (ref 65–99)
Glucose-Capillary: 330 mg/dL — ABNORMAL HIGH (ref 65–99)
Glucose-Capillary: 224 mg/dL — ABNORMAL HIGH (ref 65–99)
Glucose-Capillary: 385 mg/dL — ABNORMAL HIGH (ref 65–99)
Glucose-Capillary: 367 mg/dL — ABNORMAL HIGH (ref 65–99)
Glucose-Capillary: 375 mg/dL — ABNORMAL HIGH (ref 65–99)

## 2016-12-21 LAB — CBC
HCT: 43.2 % (ref 35.0–47.0)
Hemoglobin: 14.6 g/dL (ref 12.0–16.0)
MCH: 33.9 pg (ref 26.0–34.0)
MCHC: 33.8 g/dL (ref 32.0–36.0)
MCV: 100.3 fL — ABNORMAL HIGH (ref 80.0–100.0)
Platelets: 64 10*3/uL — ABNORMAL LOW (ref 150–440)
RBC: 4.3 MIL/uL (ref 3.80–5.20)
RDW: 13.9 % (ref 11.5–14.5)
WBC: 4 10*3/uL (ref 3.6–11.0)

## 2016-12-21 LAB — TROPONIN I: Troponin I: <0.03 ng/mL (ref <0.03)

## 2016-12-21 LAB — MRSA PCR SCREENING: MRSA by PCR: NEGATIVE

## 2016-12-21 MED ORDER — ALPRAZOLAM 0.5 MG PO TABS: 0.5000 mg | ORAL_TABLET | Freq: Three times a day (TID) | ORAL | 0 refills | Status: DC | PRN

## 2016-12-21 MED ORDER — INSULIN DETEMIR 100 UNIT/ML ~~LOC~~ SOLN
35.0000 [IU] | Freq: Two times a day (BID) | SUBCUTANEOUS | Status: DC
  Administered 2016-12-21 – 2016-12-22 (×2): 35 [IU] via SUBCUTANEOUS
  Filled 2016-12-21 (×4): qty 0.35

## 2016-12-21 MED ORDER — OXYCODONE HCL 5 MG PO TABS
5.0000 mg | ORAL_TABLET | Freq: Four times a day (QID) | ORAL | 0 refills | Status: DC | PRN
Start: 2016-12-21 — End: 2017-02-18

## 2016-12-21 MED ORDER — INSULIN ASPART 100 UNIT/ML ~~LOC~~ SOLN
15.0000 [IU] | Freq: Once | SUBCUTANEOUS | Status: AC
  Administered 2016-12-21: 15 [IU] via SUBCUTANEOUS
  Filled 2016-12-21: qty 1

## 2016-12-21 MED ORDER — ENOXAPARIN SODIUM 40 MG/0.4ML ~~LOC~~ SOLN: 40.0000 mg | Freq: Two times a day (BID) | SUBCUTANEOUS | Status: DC

## 2016-12-21 MED ORDER — LORAZEPAM 2 MG/ML IJ SOLN
1.0000 mg | INTRAMUSCULAR | Status: DC | PRN
  Administered 2016-12-21 – 2016-12-22 (×4): 1 mg via INTRAVENOUS
  Filled 2016-12-21 (×4): qty 1

## 2016-12-21 MED ORDER — ONDANSETRON HCL 4 MG/2ML IJ SOLN
4.0000 mg | Freq: Once | INTRAMUSCULAR | Status: AC
  Administered 2016-12-21: 4 mg via INTRAVENOUS
  Filled 2016-12-21: qty 2

## 2016-12-21 NOTE — Plan of Care (Signed)
Pt is A&Ox4. VSS. RA . NSR on monitor. Family at bedside. Pt NPO after MN for stress test in AM . Pt aware of plan. OOB to bathroom with standby assist. PRN ativan given per order for anxiety. Will continue to monitor and report to oncoming RN .  Progressing Spiritual Needs Ability to function at adequate level 12/21/2016 2256 - Progressing by Aleen Campi, RN Education: Knowledge of General Education information will improve 12/21/2016 2256 - Progressing by Aleen Campi, RN Pain Managment: General experience of comfort will improve 12/21/2016 2256 - Progressing by Aleen Campi, RN Safety: Ability to remain free from injury will improve 12/21/2016 2256 - Progressing by Aleen Campi, RN Education: Understanding of cardiac disease, CV risk reduction, and recovery process will improve 12/21/2016 2256 - Progressing by Aleen Campi, RN Activity: Ability to tolerate increased activity will improve 12/21/2016 2256 - Progressing by Aleen Campi, RN Cardiac: Ability to achieve and maintain adequate cardiovascular perfusion will improve 12/21/2016 2256 - Progressing by Dylon Correa, [place] Behavior/Discharge Planning: Ability to safely manage health-related needs after discharge will improve 12/21/2016 2256 - Progressing by Aleen Campi, RN

## 2016-12-21 NOTE — Progress Notes (Signed)
[place] at [facility]                                                                                                                                                                                  Patient Demographics   Lori Mckenzie, is a 51 y.o. female, DOB - 09-09-1965, AOZ:[id number]  Admit date - 12/20/2016   Admitting Physician Lance Coon, MD  Outpatient Primary MD for the patient is Ashkin, Neldon Labella, MD   LOS - 0  Subjective: Patient admitted with chest pain and has multiple risk factors Patient complaining of nausea   Review of Systems:   CONSTITUTIONAL: No documented fever. No fatigue, weakness. No weight gain, no weight loss.  EYES: No blurry or double vision.  ENT: No tinnitus. No postnasal drip. No redness of the oropharynx.  RESPIRATORY: No cough, no wheeze, no hemoptysis. No dyspnea.  CARDIOVASCULAR: +chest pain. No orthopnea. No palpitations. No syncope.  GASTROINTESTINAL: +nausea, no vomiting or diarrhea. No abdominal pain. No melena or hematochezia.  GENITOURINARY: No dysuria or hematuria.  ENDOCRINE: No polyuria or nocturia. No heat or cold intolerance.  HEMATOLOGY: No anemia. No bruising. No bleeding.  INTEGUMENTARY: No rashes. No lesions.  MUSCULOSKELETAL: No arthritis. No swelling. No gout.  NEUROLOGIC: No numbness, tingling, or ataxia. No seizure-type activity.  PSYCHIATRIC: No anxiety. No insomnia. No ADD.    Vitals:   Vitals:   12/21/16 0348 12/21/16 0725 12/21/16 1623 12/21/16 1625  BP: 122/74 108/65 (!) 93/40 114/78  Pulse: 95 73 91 91  Resp: 18 18 18    Temp: 98.4 F (36.9 C) 98.2 F (36.8 C) (!) 97.5 F (36.4 C)   TempSrc:  Oral Oral   SpO2: 94% 96% 95%   Weight:      Height:        Wt Readings from Last 3 Encounters:  12/20/16 251 lb 9.6 oz (114.1 kg)  12/15/16 254 lb (115.2 kg)  12/06/16 260 lb (117.9 kg)     Intake/Output Summary (Last 24 hours) at 12/21/2016 1627 Last data filed at 12/21/2016  1437 Gross per 24 hour  Intake 1240 ml  Output -  Net 1240 ml    Physical Exam:   GENERAL: Pleasant-appearing in no apparent distress.  HEAD, EYES, EARS, NOSE AND THROAT: Atraumatic, normocephalic. Extraocular muscles are intact. Pupils equal and reactive to light. Sclerae anicteric. No conjunctival injection. No oro-pharyngeal erythema.  NECK: Supple. There is no jugular venous distention. No bruits, no lymphadenopathy, no thyromegaly.  HEART: Regular rate and rhythm,. No murmurs, no rubs, no clicks.  LUNGS: Clear to auscultation bilaterally. No rales or rhonchi. No wheezes.  ABDOMEN: Soft, flat, nontender, nondistended. Has  good bowel sounds. No hepatosplenomegaly appreciated.  EXTREMITIES: No evidence of any cyanosis, clubbing, or peripheral edema.  +2 pedal and radial pulses bilaterally.  NEUROLOGIC: The patient is alert, awake, and oriented x3 with no focal motor or sensory deficits appreciated bilaterally.  SKIN: Moist and warm with no rashes appreciated.  Psych: Not anxious, depressed LN: No inguinal LN enlargement    Antibiotics   Anti-infectives (From admission, onward)   Start     Dose/Rate Route Frequency Ordered Stop   12/20/16 2230  rifaximin (XIFAXAN) tablet 550 mg     550 mg Oral 2 times daily 12/20/16 2227        Medications   Scheduled Meds: . citalopram  40 mg Oral Daily  . furosemide  40 mg Oral Daily  . gabapentin  600 mg Oral QID  . Influenza vac split quadrivalent PF  0.5 mL Intramuscular Tomorrow-1000  . insulin aspart  0-9 Units Subcutaneous Q6H  . insulin detemir  35 Units Subcutaneous BID  . lactulose  20 g Oral TID  . lidocaine  1 patch Transdermal Q24H  . pantoprazole  40 mg Oral BID AC  . rifaximin  550 mg Oral BID  . spironolactone  100 mg Oral Daily   Continuous Infusions: PRN Meds:.acetaminophen **OR** acetaminophen, diclofenac sodium, ipratropium-albuterol, LORazepam, nitroGLYCERIN, ondansetron **OR** ondansetron (ZOFRAN) IV, oxyCODONE,  promethazine   Data Review:   Micro Results Recent Results (from the past 240 hour(s))  MRSA PCR Screening     Status: None   Collection Time: 12/20/16 11:51 PM  Result Value Ref Range Status   MRSA by PCR NEGATIVE NEGATIVE Final    Comment:        The GeneXpert MRSA Assay (FDA approved for NASAL specimens only), is one component of a comprehensive MRSA colonization surveillance program. It is not intended to diagnose MRSA infection nor to guide or monitor treatment for MRSA infections.     Radiology Reports Dg Chest 2 View  Result Date: 12/20/2016 CLINICAL DATA:  Central chest tightness.  Radiation down left arm. EXAM: CHEST  2 VIEW COMPARISON:  12/15/2016 . FINDINGS: Mediastinum and hilar structures normal. Stable cardiomegaly. No pulmonary infiltrate. Low lung volumes. No pleural effusion or pneumothorax. No acute bony abnormality IMPRESSION: 1. Stable cardiomegaly. 2. Low lung volumes.  No acute pulmonary infiltrate. Electronically Signed   By: Marcello Moores  Register   On: 12/20/2016 15:15   Dg Chest 2 View  Result Date: 12/15/2016 CLINICAL DATA:  Chest pain and blurry vision with nausea. EXAM: CHEST  2 VIEW COMPARISON:  None. FINDINGS: Stable borderline cardiomegaly. No aortic aneurysm. No alveolar consolidation or pneumothorax. No pleural effusion. No overt pulmonary edema. The visualized skeletal structures are unremarkable. IMPRESSION: No active cardiopulmonary disease. Electronically Signed   By: Ashley Royalty M.D.   On: 12/15/2016 20:16   Dg C-arm 1-60 Min-no Report  Result Date: 12/06/2016 Fluoroscopy was utilized by the requesting physician.  No radiographic interpretation.     CBC Recent Labs  Lab 12/15/16 1945 12/20/16 1431 12/21/16 0434  WBC 7.4 5.6 4.0  HGB 15.9 15.6 14.6  HCT 46.6 46.1 43.2  PLT 75* 74* 64*  MCV 100.3* 100.7* 100.3*  MCH 34.2* 34.0 33.9  MCHC 34.1 33.8 33.8  RDW 13.7 14.0 13.9    Chemistries  Recent Labs  Lab 12/15/16 1945  12/16/16 0007 12/20/16 1431 12/20/16 1630 12/21/16 0434  NA 134*  --  131*  --  134*  K 4.1  --  4.7  --  3.9  CL 103  --  99*  --  101  CO2 23  --  24  --  27  GLUCOSE 478*  --  489*  --  454*  BUN 14  --  11  --  9  CREATININE 0.94  --  0.72  --  0.57  CALCIUM 9.3  --  9.3  --  8.9  AST  --  44*  --  52* 46*  ALT  --  51  --  70* 66*  ALKPHOS  --  138*  --  129* 111  BILITOT  --  1.0  --  1.1 1.3*   ------------------------------------------------------------------------------------------------------------------ estimated creatinine clearance is 97.2 mL/min (by C-G formula based on SCr of 0.57 mg/dL). ------------------------------------------------------------------------------------------------------------------ No results for input(s): HGBA1C in the last 72 hours. ------------------------------------------------------------------------------------------------------------------ No results for input(s): CHOL, HDL, LDLCALC, TRIG, CHOLHDL, LDLDIRECT in the last 72 hours. ------------------------------------------------------------------------------------------------------------------ No results for input(s): TSH, T4TOTAL, T3FREE, THYROIDAB in the last 72 hours.  Invalid input(s): FREET3 ------------------------------------------------------------------------------------------------------------------ No results for input(s): VITAMINB12, FOLATE, FERRITIN, TIBC, IRON, RETICCTPCT in the last 72 hours.  Coagulation profile No results for input(s): INR, PROTIME in the last 168 hours.  No results for input(s): DDIMER in the last 72 hours.  Cardiac Enzymes Recent Labs  Lab 12/20/16 1431 12/20/16 1818 12/21/16 0012  TROPONINI <0.03 <0.03 <0.03   ------------------------------------------------------------------------------------------------------------------ Invalid input(s): POCBNP    Assessment & Plan  Patient is a 51 year old presenting with chest pain   #1 Chest pain  we'll obtain a stress test cardiac enzymes negative patient with multiple risk factors #2 Nausea or vomiting supportive care #3   OSA on CPAP -CPAP nightly #4 Diastolic dysfunction with chronic heart failure ([place]) -continue home meds #5  Essential (primary) hypertension -home dose antihypertensives  #6 Diabetes mellitus, insulin dependent (IDDM), uncontrolled (HCC) -sliding scale insulin with corresponding glucose checks #7GERD (gastroesophageal reflux disease) -home dose PPI #8  Anxiety -home dose anxiolytics #9Cirrhosis of liver without ascites ([place]) -avoid hepatotoxins       Code Status Orders  (From admission, onward)        Start     Ordered   12/20/16 2227  Full code  Continuous     12/20/16 2227    Code Status History    Date Active Date Inactive Code Status Order ID Comments User Context   10/21/2016 19:29 10/26/2016 18:24 Full Code [id number]  Epifanio Lesches, MD ED   09/23/2016 19:15 09/25/2016 17:46 Full Code [id number]  Demetrios Loll, MD Inpatient   06/28/2016 21:22 06/30/2016 19:09 Full Code [id number]  Henreitta Leber, MD Inpatient   03/12/2015 00:09 03/13/2015 17:23 Full Code [id number]  Gladstone Lighter, MD Inpatient   10/04/2014 12:59 10/06/2014 20:20 Full Code [id number]  Baxter Hire, MD Inpatient   09/05/2014 02:29 09/08/2014 18:10 Full Code [id number]  Lance Coon, MD Inpatient   08/25/2014 18:23 08/30/2014 13:48 Full Code [id number]  Florene Glen, MD ED   08/03/2014 17:12 08/08/2014 15:16 Full Code [id number]  Marlyce Huge, MD ED   07/24/2014 18:45 07/30/2014 17:31 Full Code [id number]  Florene Glen, MD Inpatient   07/23/2014 11:42 07/24/2014 03:27 Full Code [id number]  Sabino Dick, MD [place]   07/12/2014 23:25 07/16/2014 16:30 Full Code [id number]  Lance Coon, MD Inpatient           Consults  none   DVT Prophylaxis scd's  Lab Results  Component Value Date   PLT 64 (L) 12/21/2016     Time Spent in  minutes   36mn Greater than 50% of time spent  in care coordination and counseling patient regarding the condition and plan of care.   PDustin FlockM.D on 12/21/2016 at 4:27 PM  Between 7am to 6pm - Pager - [phone]  After 6pm go to [email or web address] - password [email or web address]   Office  [phone]

## 2016-12-21 NOTE — Progress Notes (Signed)
Anticoagulation monitoring(Lovenox):  51yo  female ordered Lovenox 40 mg Q24h  Filed Weights   12/20/16 1433 12/20/16 2225  Weight: 254 lb (115.2 kg) 251 lb 9.6 oz (114.1 kg)   Body mass index is 46.02 kg/m.   Lab Results  Component Value Date   CREATININE 0.57 12/21/2016   CREATININE 0.72 12/20/2016   CREATININE 0.94 12/15/2016   Estimated Creatinine Clearance: 97.2 mL/min (by C-G formula based on SCr of 0.57 mg/dL). Hemoglobin & Hematocrit     Component Value Date/Time   HGB 14.6 12/21/2016 0434   HGB 16.5 (H) 03/27/2014 1042   HCT 43.2 12/21/2016 0434   HCT 49.3 (H) 03/27/2014 1042     Per Protocol for Patient with estCrcl >30 ml/min and BMI > 40, will transition to Lovenox 40 mg Q12h.

## 2016-12-21 NOTE — Plan of Care (Signed)
  Progressing Education: Knowledge of General Education information will improve 12/21/2016 0435 - Progressing by Loran Senters, RN Activity: Ability to tolerate increased activity will improve 12/21/2016 0435 - Progressing by Loran Senters, RN Cardiac: Ability to achieve and maintain adequate cardiovascular perfusion will improve 12/21/2016 0435 - Progressing by Loran Senters, RN

## 2016-12-21 NOTE — Progress Notes (Signed)
Patient AM CBG 464. MD notified. Per MD, give 15 units of novolog. Will administer and continue to monitor.

## 2016-12-21 NOTE — Progress Notes (Signed)
Inpatient Diabetes Program Recommendations  AACE/ADA: New Consensus Statement on Inpatient Glycemic Control (2015)  Target Ranges:  Prepandial:   less than 140 mg/dL      Peak postprandial:   less than 180 mg/dL (1-2 hours)      Critically ill patients:  140 - 180 mg/dL   Lab Results  Component Value Date   GLUCAP 224 (H) 12/21/2016   HGBA1C 5.7 07/12/2014    Review of Glycemic Control  Diabetes history: DM 2 Outpatient Diabetes medications: Levemir 50 units BID,  Current orders for Inpatient glycemic control: Levemir 25 units BID, Novolog Sensitive Correction 0-9 units Q6 hours  Inpatient Diabetes Program Recommendations:    Glucose >450 mg/dl this am. Patient on half of home dose of Levemir. Consider increasing Levemir to 70% of home dose (35 units BID).  Thanks,  Tama Headings RN, MSN, [facility] Inpatient Diabetes Coordinator Team Pager [phone] (8a-5p)

## 2016-12-22 ENCOUNTER — Observation Stay: Payer: Medicaid Other

## 2016-12-22 ENCOUNTER — Encounter: Payer: Self-pay | Admitting: Radiology

## 2016-12-22 ENCOUNTER — Telehealth: Payer: Self-pay | Admitting: Pain Medicine

## 2016-12-22 LAB — GLUCOSE, CAPILLARY
Glucose-Capillary: 349 mg/dL — ABNORMAL HIGH (ref 65–99)
Glucose-Capillary: 294 mg/dL — ABNORMAL HIGH (ref 65–99)
Glucose-Capillary: 198 mg/dL — ABNORMAL HIGH (ref 65–99)
Glucose-Capillary: 289 mg/dL — ABNORMAL HIGH (ref 65–99)

## 2016-12-22 MED ORDER — TECHNETIUM TC 99M TETROFOSMIN IV KIT
32.9100 | PACK | Freq: Once | INTRAVENOUS | Status: AC | PRN
  Administered 2016-12-22: 32.91 via INTRAVENOUS

## 2016-12-22 MED ORDER — MORPHINE SULFATE (PF) 2 MG/ML IV SOLN: 2.0000 mg | Freq: Once | INTRAVENOUS | Status: DC

## 2016-12-22 MED ORDER — SUMATRIPTAN SUCCINATE 6 MG/0.5ML ~~LOC~~ SOLN
6.0000 mg | Freq: Once | SUBCUTANEOUS | Status: AC
  Administered 2016-12-22: 6 mg via SUBCUTANEOUS
  Filled 2016-12-22: qty 0.5

## 2016-12-22 MED ORDER — INSULIN ASPART 100 UNIT/ML ~~LOC~~ SOLN
0.0000 [IU] | Freq: Three times a day (TID) | SUBCUTANEOUS | Status: DC
  Administered 2016-12-22: 5 [IU] via SUBCUTANEOUS
  Filled 2016-12-22: qty 1

## 2016-12-22 MED ORDER — REGADENOSON 0.4 MG/5ML IV SOLN
0.4000 mg | Freq: Once | INTRAVENOUS | Status: AC
  Administered 2016-12-22: 0.4 mg via INTRAVENOUS

## 2016-12-22 MED ORDER — TECHNETIUM TC 99M TETROFOSMIN IV KIT
14.0990 | PACK | Freq: Once | INTRAVENOUS | Status: AC | PRN
  Administered 2016-12-22: 14.099 via INTRAVENOUS

## 2016-12-22 NOTE — Telephone Encounter (Signed)
Patient is being released today and received roxycodone 58m #30, wanted to let uKoreaknow. Is she under contract with Dr. NDossie Arbourabout her meds ? Please let her know

## 2016-12-22 NOTE — Discharge Summary (Signed)
[facility] - [place] at [facility], [place] y.o., DOB [birth year], MRN [id number]. Admission date: 12/20/2016 Discharge Date 12/22/2016 Primary MD Ashkin, Neldon Labella, MD Admitting Physician Lance Coon, MD  Admission Diagnosis  Nonspecific chest pain [R07.9]  Discharge Diagnosis   Principal Problem:   Chest pain noncardiac per cardiology stress test negative   GERD (gastroesophageal reflux disease)   OSA on CPAP   Anxiety   Diastolic dysfunction with chronic heart failure ([place])   Essential (primary) hypertension   Cirrhosis of liver without ascites (HCC)   Chronic pain syndrome   Diabetes mellitus, insulin dependent (IDDM), uncontrolled [facility])           Hospital Course  Patient is 51 year old female with multiple medical problems with chronic pain syndrome presented with chest pain she was admitted to the hospital and ruled out for an MI.  She underwent a stress test which was negative per report per cardiology.  Patient is not having any chest pain but has multiple other complaints.  She needs to follow-up with her primary care provider surgeries to these.            Consults  cardiology  Significant Tests:  See full reports for all details     Dg Chest 2 View  Result Date: 12/20/2016 CLINICAL DATA:  Central chest tightness.  Radiation down left arm. EXAM: CHEST  2 VIEW COMPARISON:  12/15/2016 . FINDINGS: Mediastinum and hilar structures normal. Stable cardiomegaly. No pulmonary infiltrate. Low lung volumes. No pleural effusion or pneumothorax. No acute bony abnormality IMPRESSION: 1. Stable cardiomegaly. 2. Low lung volumes.  No acute pulmonary infiltrate. Electronically Signed   By: Marcello Moores  Register   On: 12/20/2016 15:15   Dg Chest 2 View  Result Date: 12/15/2016 CLINICAL DATA:  Chest pain and blurry vision with nausea. EXAM: CHEST  2 VIEW COMPARISON:  None. FINDINGS: Stable borderline cardiomegaly. No aortic aneurysm. No alveolar consolidation  or pneumothorax. No pleural effusion. No overt pulmonary edema. The visualized skeletal structures are unremarkable. IMPRESSION: No active cardiopulmonary disease. Electronically Signed   By: Ashley Royalty M.D.   On: 12/15/2016 20:16   Dg C-arm 1-60 Min-no Report  Result Date: 12/06/2016 Fluoroscopy was utilized by the requesting physician.  No radiographic interpretation.       Today   Subjective:   Lori Mckenzie patient complains of headache nausea  Objective:   Blood pressure 106/71, pulse 85, temperature 97.8 F (36.6 C), resp. rate 18, height 5' 2"  (1.575 m), weight 251 lb 9.6 oz (114.1 kg), SpO2 97 %.  .  Intake/Output Summary (Last 24 hours) at 12/22/2016 1606 Last data filed at 12/22/2016 1347 Gross per 24 hour  Intake 480 ml  Output 0 ml  Net 480 ml    Exam VITAL SIGNS: Blood pressure 106/71, pulse 85, temperature 97.8 F (36.6 C), resp. rate 18, height 5' 2"  (1.575 m), weight 251 lb 9.6 oz (114.1 kg), SpO2 97 %.  GENERAL:  51 y.o.-year-old patient lying in the bed with no acute distress.  EYES: Pupils equal, round, reactive to light and accommodation. No scleral icterus. Extraocular muscles intact.  HEENT: Head atraumatic, normocephalic. Oropharynx and nasopharynx clear.  NECK:  Supple, no jugular venous distention. No thyroid enlargement, no tenderness.  LUNGS: Normal breath sounds bilaterally, no wheezing, rales,rhonchi or crepitation. No use of accessory muscles of respiration.  CARDIOVASCULAR: S1, S2 normal. No murmurs, rubs, or gallops.  ABDOMEN: Soft, nontender, nondistended. Bowel sounds present. No organomegaly or mass.  EXTREMITIES: No pedal edema, cyanosis, or clubbing.  NEUROLOGIC: Cranial nerves II through XII are intact. Muscle strength 5/5 in all extremities. Sensation intact. Gait not checked.  PSYCHIATRIC: The patient is alert and oriented x 3.  SKIN: No obvious rash, lesion, or ulcer.   Data Review     CBC w Diff:  Lab Results  Component Value  Date   WBC 4.0 12/21/2016   HGB 14.6 12/21/2016   HGB 16.5 (H) 03/27/2014   HCT 43.2 12/21/2016   HCT 49.3 (H) 03/27/2014   PLT 64 (L) 12/21/2016   PLT 209 03/27/2014   LYMPHOPCT 30 10/02/2016   LYMPHOPCT 23.7 03/27/2014   MONOPCT 7 10/02/2016   MONOPCT 5.6 03/27/2014   EOSPCT 1 10/02/2016   EOSPCT 0.4 03/27/2014   BASOPCT 1 10/02/2016   BASOPCT 0.8 03/27/2014   CMP:  Lab Results  Component Value Date   NA 134 (L) 12/21/2016   NA 142 03/27/2014   K 3.9 12/21/2016   K 3.5 03/27/2014   CL 101 12/21/2016   CL 101 03/27/2014   CO2 27 12/21/2016   CO2 27 03/27/2014   BUN 9 12/21/2016   BUN 16 03/27/2014   CREATININE 0.57 12/21/2016   CREATININE 0.73 03/27/2014   PROT 6.2 (L) 12/21/2016   PROT 9.4 (H) 03/27/2014   ALBUMIN 3.5 12/21/2016   ALBUMIN 5.3 (H) 03/27/2014   BILITOT 1.3 (H) 12/21/2016   BILITOT 1.5 (H) 03/27/2014   ALKPHOS 111 12/21/2016   ALKPHOS 63 03/27/2014   AST 46 (H) 12/21/2016   AST 81 (H) 03/27/2014   ALT 66 (H) 12/21/2016   ALT 58 (H) 03/27/2014  .  Micro Results Recent Results (from the past 240 hour(s))  MRSA PCR Screening     Status: None   Collection Time: 12/20/16 11:51 PM  Result Value Ref Range Status   MRSA by PCR NEGATIVE NEGATIVE Final    Comment:        The GeneXpert MRSA Assay (FDA approved for NASAL specimens only), is one component of a comprehensive MRSA colonization surveillance program. It is not intended to diagnose MRSA infection nor to guide or monitor treatment for MRSA infections.         Code Status Orders  (From admission, onward)        Start     Ordered   12/20/16 2227  Full code  Continuous     12/20/16 2227    Code Status History    Date Active Date Inactive Code Status Order ID Comments User Context   10/21/2016 19:29 10/26/2016 18:24 Full Code [id number]  Epifanio Lesches, MD ED   09/23/2016 19:15 09/25/2016 17:46 Full Code [id number]  Demetrios Loll, MD Inpatient   06/28/2016 21:22 06/30/2016 19:09  Full Code [id number]  Henreitta Leber, MD Inpatient   03/12/2015 00:09 03/13/2015 17:23 Full Code [id number]  Gladstone Lighter, MD Inpatient   10/04/2014 12:59 10/06/2014 20:20 Full Code [id number]  Baxter Hire, MD Inpatient   09/05/2014 02:29 09/08/2014 18:10 Full Code [id number]  Lance Coon, MD Inpatient   08/25/2014 18:23 08/30/2014 13:48 Full Code [id number]  Florene Glen, MD ED   08/03/2014 17:12 08/08/2014 15:16 Full Code [id number]  Marlyce Huge, MD ED   07/24/2014 18:45 07/30/2014 17:31 Full Code [id number]  Florene Glen, MD Inpatient   07/23/2014 11:42 07/24/2014 03:27 Full Code [id number]  Sabino Dick, MD HOV   07/12/2014 23:25 07/16/2014 16:30 Full Code [id number]  Lance Coon, MD Inpatient  Follow-up Information    Ashkin, Neldon Labella, MD. Go on 12/28/2016.   Specialty:  Family Medicine Why:  @10  AM Contact information: [place] [place] [address] [phone]           Discharge Medications   Allergies as of 12/22/2016      Reactions   Tape Swelling   Vicodin [hydrocodone-acetaminophen] Itching, Rash      Medication List    TAKE these medications   albuterol 108 (90 Base) MCG/ACT inhaler Commonly known as:  PROVENTIL HFA;VENTOLIN HFA Inhale 2 puffs into the lungs every 6 (six) hours as needed for wheezing or shortness of breath.   ALPRAZolam 0.5 MG tablet Commonly known as:  XANAX Take 1 tablet (0.5 mg total) by mouth 3 (three) times daily as needed for sleep or anxiety.   cholecalciferol 1000 units tablet Commonly known as:  VITAMIN D Take 2,000 Units by mouth daily.   citalopram 40 MG tablet Commonly known as:  CELEXA Take by mouth daily.   diclofenac sodium 1 % Gel Commonly known as:  VOLTAREN Apply 4 g topically every 8 (eight) hours as needed.   fluticasone 50 MCG/ACT nasal spray Commonly known as:  FLONASE Place 1-2 sprays into both nostrils daily.   furosemide 40 MG tablet Commonly known as:  LASIX Take 1 tablet  (40 mg total) by mouth 2 (two) times daily. What changed:  when to take this   gabapentin 300 MG capsule Commonly known as:  NEURONTIN Take 600 mg by mouth 4 (four) times daily.   insulin detemir 100 UNIT/ML injection Commonly known as:  LEVEMIR Inject 0.25 mLs (25 Units total) into the skin 2 (two) times daily. What changed:  how much to take   INTRINSI B12-FOLATE 800-500-20 MCG-MCG-MG Tabs Take 1 tablet by mouth daily. Reported on 04/06/2015   ipratropium-albuterol 0.5-2.5 (3) MG/3ML Soln Commonly known as:  DUONEB Take 3 mLs by nebulization every 4 (four) hours as needed. For shortness of breath/wheezing   lactulose 10 GM/15ML solution Commonly known as:  CHRONULAC Take 30 mLs (20 g total) by mouth 3 (three) times daily.   lidocaine 5 % Commonly known as:  LIDODERM Place 1 patch onto the skin daily. Remove & Discard patch within 12 hours or as directed by MD   nystatin powder Commonly known as:  MYCOSTATIN/NYSTOP Apply 1 g topically 3 (three) times daily as needed. For irritation   oxyCODONE 5 MG immediate release tablet Commonly known as:  Oxy IR/ROXICODONE Take 1 tablet (5 mg total) by mouth every 6 (six) hours as needed for moderate pain.   pantoprazole 40 MG tablet Commonly known as:  PROTONIX Take 40 mg by mouth 2 (two) times daily before a meal.   potassium chloride SA 20 MEQ tablet Commonly known as:  K-DUR,KLOR-CON Take 10-20 mEq by mouth daily as needed. For high blood pressure   promethazine 12.5 MG tablet Commonly known as:  PHENERGAN Take 2 tablets (25 mg total) by mouth every 6 (six) hours as needed for nausea or vomiting.   rizatriptan 10 MG tablet Commonly known as:  MAXALT Take 10 mg by mouth as needed for migraine.   spironolactone 100 MG tablet Commonly known as:  ALDACTONE Take 100 mg by mouth daily.   vitamin B-12 1000 MCG tablet Commonly known as:  CYANOCOBALAMIN Take 2,000 mcg by mouth daily.   XIFAXAN 550 MG Tabs tablet Generic drug:   rifaximin Take 550 mg by mouth 2 (two) times daily.  Total Time in preparing paper work, data evaluation and todays exam - 35 minutes  Dustin Flock M.D on 12/22/2016 at 4:06 [facility]  [facility]   Office  [phone]

## 2016-12-22 NOTE — Progress Notes (Signed)
Inpatient Diabetes Program Recommendations  AACE/ADA: New Consensus Statement on Inpatient Glycemic Control (2015)  Target Ranges:  Prepandial:   less than 140 mg/dL      Peak postprandial:   less than 180 mg/dL (1-2 hours)      Critically ill patients:  140 - 180 mg/dL   Lab Results  Component Value Date   GLUCAP 294 (H) 12/22/2016   HGBA1C 5.7 07/12/2014    Review of Glycemic Control  Results for Lori Mckenzie, Lori Mckenzie (MRN [id number]) as of 12/22/2016 09:21  Ref. Range 12/21/2016 14:15 12/21/2016 17:59 12/21/2016 20:39 12/22/2016 01:46 12/22/2016 06:00  Glucose-Capillary Latest Ref Range: 65 - 99 mg/dL 385 (H) 367 (H) 375 (H) 349 (H) 294 (H)   Diabetes history: DM 2 Outpatient Diabetes medications: Levemir 50 units BID,   Current orders for Inpatient glycemic control: Levemir 35 units BID, Novolog Sensitive Correction 0-9 units tid  Inpatient Diabetes Program Recommendations:  CBG remain elevated- currently NPO- when she is able to eat, please add 3 units Novolog tid and increase Novolog correction to moderate correction scale 0-15 units tid and add Novolog 0-5 units qhs.    Per ADA recommendations "consider performing an A1C on all patients with diabetes or hyperglycemia admitted to the hospital if not performed in the prior 3 months".  Gentry Fitz, RN, BA, MHA, CDE Diabetes Coordinator Inpatient Diabetes Program  [phone] (Team Pager) [phone] ([place]) 12/22/2016 9:25 AM

## 2016-12-22 NOTE — Care Management (Signed)
Patient admitted with chest pain. Patient .  Patient lives at home with husband.   PCP Ashkin at [facility].  Patient has a RW and cane in the home for ambulation.   Patient previously did not have insurance and utilized [facility].  Patient now has Medicaid and uses [employer].  No RNCM needs at discharge.

## 2017-01-09 ENCOUNTER — Other Ambulatory Visit: Payer: Self-pay

## 2017-01-09 ENCOUNTER — Encounter: Payer: Self-pay | Admitting: *Deleted

## 2017-01-09 ENCOUNTER — Emergency Department
Admission: EM | Admit: 2017-01-09 | Discharge: 2017-01-09 | Disposition: A | Discharge status: Home / Self Care | Payer: Medicaid Other | Attending: Emergency Medicine | Admitting: Emergency Medicine

## 2017-01-09 DIAGNOSIS — H538 Other visual disturbances: Secondary | ICD-10-CM | POA: Diagnosis not present

## 2017-01-09 DIAGNOSIS — Z8673 Personal history of transient ischemic attack (TIA), and cerebral infarction without residual deficits: Secondary | ICD-10-CM | POA: Insufficient documentation

## 2017-01-09 DIAGNOSIS — N189 Chronic kidney disease, unspecified: Secondary | ICD-10-CM | POA: Diagnosis not present

## 2017-01-09 DIAGNOSIS — I129 Hypertensive chronic kidney disease with stage 1 through stage 4 chronic kidney disease, or unspecified chronic kidney disease: Secondary | ICD-10-CM | POA: Diagnosis not present

## 2017-01-09 DIAGNOSIS — E039 Hypothyroidism, unspecified: Secondary | ICD-10-CM | POA: Insufficient documentation

## 2017-01-09 DIAGNOSIS — R739 Hyperglycemia, unspecified: Secondary | ICD-10-CM

## 2017-01-09 DIAGNOSIS — J45909 Unspecified asthma, uncomplicated: Secondary | ICD-10-CM | POA: Diagnosis not present

## 2017-01-09 DIAGNOSIS — E1165 Type 2 diabetes mellitus with hyperglycemia: Secondary | ICD-10-CM | POA: Diagnosis not present

## 2017-01-09 DIAGNOSIS — E1122 Type 2 diabetes mellitus with diabetic chronic kidney disease: Secondary | ICD-10-CM | POA: Diagnosis not present

## 2017-01-09 DIAGNOSIS — I5030 Unspecified diastolic (congestive) heart failure: Secondary | ICD-10-CM | POA: Diagnosis not present

## 2017-01-09 DIAGNOSIS — R42 Dizziness and giddiness: Secondary | ICD-10-CM | POA: Diagnosis present

## 2017-01-09 DIAGNOSIS — I110 Hypertensive heart disease with heart failure: Secondary | ICD-10-CM | POA: Insufficient documentation

## 2017-01-09 DIAGNOSIS — J449 Chronic obstructive pulmonary disease, unspecified: Secondary | ICD-10-CM | POA: Diagnosis not present

## 2017-01-09 DIAGNOSIS — Z794 Long term (current) use of insulin: Secondary | ICD-10-CM | POA: Insufficient documentation

## 2017-01-09 LAB — URINALYSIS, COMPLETE (UACMP) WITH MICROSCOPIC
Bilirubin Urine: NEGATIVE
Glucose, UA: >=500 mg/dL — AB
Hgb urine dipstick: NEGATIVE
Ketones, ur: NEGATIVE mg/dL
Leukocytes, UA: NEGATIVE
Nitrite: NEGATIVE
Protein, ur: NEGATIVE mg/dL
Specific Gravity, Urine: 1.035 — ABNORMAL HIGH (ref 1.005–1.030)
pH: 5 (ref 5.0–8.0)

## 2017-01-09 LAB — BASIC METABOLIC PANEL
Anion gap: 9 (ref 5–15)
BUN: 6 mg/dL (ref 6–20)
CO2: 25 mmol/L (ref 22–32)
Calcium: 8.8 mg/dL — ABNORMAL LOW (ref 8.9–10.3)
Chloride: 102 mmol/L (ref 101–111)
Creatinine, Ser: 0.61 mg/dL (ref 0.44–1.00)
GFR calc Af Amer: >60 mL/min (ref >60)
GFR calc non Af Amer: >60 mL/min (ref >60)
Glucose, Bld: 321 mg/dL — ABNORMAL HIGH (ref 65–99)
Potassium: 3.7 mmol/L (ref 3.5–5.1)
Sodium: 136 mmol/L (ref 135–145)

## 2017-01-09 LAB — GLUCOSE, CAPILLARY: Glucose-Capillary: 340 mg/dL — ABNORMAL HIGH (ref 65–99)

## 2017-01-09 LAB — CBC
HCT: 45.4 % (ref 35.0–47.0)
Hemoglobin: 15.5 g/dL (ref 12.0–16.0)
MCH: 34.5 pg — ABNORMAL HIGH (ref 26.0–34.0)
MCHC: 34.1 g/dL (ref 32.0–36.0)
MCV: 101.2 fL — ABNORMAL HIGH (ref 80.0–100.0)
Platelets: 76 10*3/uL — ABNORMAL LOW (ref 150–440)
RBC: 4.48 MIL/uL (ref 3.80–5.20)
RDW: 14.4 % (ref 11.5–14.5)
WBC: 4.8 10*3/uL (ref 3.6–11.0)

## 2017-01-09 NOTE — ED Notes (Signed)
Pt presents today for Hyperglycemia CBG 340. Pt states she has not taken her Novolog as she has not eaten today, b/c spouse was a pt here. Pt is NAD and A/O x4.

## 2017-01-09 NOTE — ED Triage Notes (Signed)
Pt was a visitor in ED when she reported to RN that she felt dizzy and lightheaded with blurred vision in the left eye. No weakness or neuro deficits noted.   Pt reports pain in neck and back with pain between shoulder blades when taking a breath x 2 weeks. Pt verbalized having been seen in ED for this pain and was given Nitro that she reported did not change the pain but then reports the MD stated, "the nitro helped to stop you from having a heart attack."   Pt is also a diabetic who reports having had high blood glucose intermittently for the past couple weeks. Pt reports she has had sugars in the 500's. Pt takes 60 units of levemir morning and night and 40 units of Novalog 3 times a day. Pt remains hyperglycemic at 340 in triage.

## 2017-01-09 NOTE — ED Provider Notes (Signed)
[facility] Emergency Department Provider Note   ____________________________________________    I have reviewed the triage vital signs and the nursing notes.   HISTORY  Chief Complaint Dizziness and Blurred Vision     HPI Lori Mckenzie is a 52 y.o. female with a history as noted below who presents with complaints of blurred vision and concerns of hyperglycemia.  Patient reports she has been in the emergency department with her husband who was seen for chest pain.  She has not been able to take her insulin while she has been in the emergency department because she left at home.  She began to feel slightly dizzy and have some mild blurry vision and became concerned that her glucose was high so she decided to check and to have it checked.  She denies chest pain, no palpitations.  No nausea or vomiting.  She no longer feels dizzy upon my evaluation   Past Medical History:  Diagnosis Date  . Abdominal abscess 08/25/2014  . Acid reflux 08/10/2010  . Acute cervical myofascial strain 02/09/2016  . Anxiety   . Ascites   . Asthma   . Back pain   . Bile leak, postoperative 07/17/2014  . Brittle bone disease   . Cancer ([place])    Uteriine  ca 29yr ago partial hysterectomy  . Cervical disc disease   . Chronic kidney disease   . Collagen vascular disease ([place]    RA  3-4 yrs ago  . COPD (chronic obstructive pulmonary disease) ([place]   . Diabetes mellitus without complication ([place]   . GERD (gastroesophageal reflux disease)   . Hypertension   . Hypothyroidism   . Left upper quadrant pain 01/09/2014  . Major depressive disorder with single episode 12/05/2011  . Major depressive disorder, single episode 12/05/2011  . Migraines   . NASH (nonalcoholic steatohepatitis)   . Respiratory infection    2/17  . Shock ([place] 09/18/2014  . Sleep apnea   . Sleep apnea   . Syncope 11/16/2014  . Thyroid disease   . TIA (transient ischemic attack)     Patient Active Problem  List   Diagnosis Date Noted  . Chest pain 12/20/2016  . Chronic feet pain (Secondary source of pain) (Bilateral) (R>L) 10/26/2016  . Acute hepatic encephalopathy 09/23/2016  . Acute postoperative pain 08/09/2016  . Low back pain due to L1-2 disc extrusion (caudal) (Left) 07/11/2016  . Abdominal pain 06/28/2016  . Constipation 06/20/2016  . Hyperglycemia 06/20/2016  . Nausea without vomiting 06/06/2016  . Chronic lower extremity pain (Bilateral) 05/19/2016  . Spinal stenosis, thoracic region (T10-11) 05/19/2016  . Diabetes mellitus, insulin dependent (IDDM), uncontrolled ([place] 04/12/2016  . Chronic sacroiliac joint pain (Bilateral) (L>R) 03/23/2016  . Chronic upper extremity pain (Left) 03/10/2016  . Chronic radicular cervical pain (L) 03/10/2016  . Osteoarthritis 02/24/2016  . Allodynia 02/09/2016  . Chronic pain syndrome 01/07/2016  . Opiate withdrawal ([place] 12/22/2015  . Elevated liver enzymes 09/28/2015  . Cirrhosis of liver without ascites ([place] 09/24/2015  . Cirrhosis ([place] 09/24/2015  . Altered mental status 07/24/2015  . Uncontrolled diabetes mellitus ([place] 07/24/2015  . Radicular pain of thoracic region 04/06/2015  . Hepatic encephalopathy ([place] 03/11/2015  . Elevated sedimentation rate 03/05/2015  . Elevated C-reactive protein (CRP) 03/05/2015  . Lumbar facet syndrome (Bilateral) (R>L) 03/05/2015  . Cervical spondylosis 03/05/2015  . Lumbar spondylosis 03/05/2015  . Encounter for chronic pain management 03/05/2015  . Chronic shoulder pain (Bilateral) (R>L) 03/05/2015  .  Chronic carpal tunnel syndrome (Bilateral) 03/05/2015  . Chronic hip pain (Bilateral) (L>R) 03/05/2015  . Chronic upper back pain (Bilateral) (L>R) 03/05/2015  . Osteoporosis, idiopathic 03/05/2015  . Abnormal MRI, lumbar spine (02/03/2015) 03/05/2015  . Thoracic radiculitis 02/05/2015  . Vitamin D deficiency 01/13/2015  . B12 deficiency 01/13/2015  . Folate deficiency 01/13/2015  . Subacute lumbar  radiculopathy (left side) (S1 dermatome) 12/10/2014  . Drowsiness 11/16/2014  . Episode of syncope 11/16/2014  . Somnolence 11/16/2014  . Opiate use (75 MME/Day) 10/28/2014  . Long term prescription opiate use 10/28/2014  . Long term current use of opiate analgesic 10/28/2014  . Encounter for therapeutic drug level monitoring 10/28/2014  . Chronic epigastric abdominal pain 10/28/2014  . Chronic low back pain (Primary Source of Pain) (Bilateral) (R>L) 10/28/2014  . Chronic neck pain [facility] source of pain) (Bilateral) (R>L) 10/28/2014  . Ascites 09/05/2014  . NASH (nonalcoholic steatohepatitis) 09/05/2014  . Hypokalemia 09/05/2014  . Dysthymia 08/05/2014  . Other social stressor 08/05/2014  . Steatohepatitis 07/15/2014  . Type 2 diabetes mellitus ([place]) 07/12/2014  . COPD with acute exacerbation ([place]) 07/12/2014  . GERD (gastroesophageal reflux disease) 07/12/2014  . OSA on CPAP 07/12/2014  . Anxiety 07/12/2014  . 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 Lumbar and sacral osteoarthritis 03/26/2014  . Myofascial pain 03/26/2014  . Lumbar spinal stenosis 03/26/2014  . Lumbosacral spondylosis without myelopathy 03/26/2014  . 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 Lumbar central spinal stenosis (L1-2 and L4-5) 03/26/2014  . Spondylosis of lumbar region without myelopathy or radiculopathy 03/26/2014  . Breath shortness 01/09/2014  . Diastolic dysfunction with chronic heart failure ([place]) 08/06/2013  . Airway hyperreactivity 08/06/2013  . Essential (primary) hypertension 08/06/2013  . Asthma 08/06/2013  . Clinical depression 12/05/2011    Past Surgical History:  Procedure Laterality Date  . ABDOMINAL HYSTERECTOMY    . CHOLECYSTECTOMY N/A 07/15/2014   Procedure: LAPAROSCOPIC CHOLECYSTECTOMY with liver biopsy ;  Surgeon: Sherri Rad, MD;  Location: [facility] ORS;  Service: General;  Laterality: N/A;  . COLONOSCOPY WITH PROPOFOL N/A 06/23/2014   Procedure: COLONOSCOPY WITH  PROPOFOL;  Surgeon: Lollie Sails, MD;  Location: [facility] ENDOSCOPY;  Service: Endoscopy;  Laterality: N/A;  . ERCP N/A 07/16/2014   Procedure: ENDOSCOPIC RETROGRADE CHOLANGIOPANCREATOGRAPHY (ERCP);  Surgeon: Clarene Essex, MD;  Location: [facility] ENDOSCOPY;  Service: Endoscopy;  Laterality: N/A;  . ERCP N/A 10/03/2014   Procedure: ENDOSCOPIC RETROGRADE CHOLANGIOPANCREATOGRAPHY (ERCP);  Surgeon: Hulen Luster, MD;  Location: [facility] ENDOSCOPY;  Service: Gastroenterology;  Laterality: N/A;  . ESOPHAGOGASTRODUODENOSCOPY N/A 06/23/2014   Procedure: ESOPHAGOGASTRODUODENOSCOPY (EGD);  Surgeon: Lollie Sails, MD;  Location: [facility] ENDOSCOPY;  Service: Endoscopy;  Laterality: N/A;  . ESOPHAGOGASTRODUODENOSCOPY N/A 06/30/2016   Procedure: ESOPHAGOGASTRODUODENOSCOPY (EGD);  Surgeon: Jonathon Bellows, MD;  Location: [facility] ENDOSCOPY;  Service: Endoscopy;  Laterality: N/A;  . ESOPHAGOGASTRODUODENOSCOPY (EGD) WITH PROPOFOL N/A 12/01/2015   Procedure: ESOPHAGOGASTRODUODENOSCOPY (EGD) WITH PROPOFOL;  Surgeon: Lollie Sails, MD;  Location: [facility] ENDOSCOPY;  Service: Endoscopy;  Laterality: N/A;  . TUBAL LIGATION    . WISDOM TOOTH EXTRACTION      Prior to Admission medications   Medication Sig Start Date End Date Taking? Authorizing Provider  albuterol (PROVENTIL HFA;VENTOLIN HFA) 108 (90 Base) MCG/ACT inhaler Inhale 2 puffs into the lungs every 6 (six) hours as needed for wheezing or shortness of breath.    [provider]  ALPRAZolam Duanne Moron) 0.5 MG tablet Take 1 tablet (0.5 mg total) by mouth 3 (three) times daily  as needed for sleep or anxiety. 12/21/16 12/21/17  Dustin Flock, MD  cholecalciferol (VITAMIN D) 1000 units tablet Take 2,000 Units by mouth daily.    [provider]  citalopram (CELEXA) 40 MG tablet Take by mouth daily.  07/24/15   [provider]  diclofenac sodium (VOLTAREN) 1 % GEL Apply 4 g topically every 8 (eight) hours as needed. 12/06/16 06/04/17  Milinda Pointer, MD  fluticasone  (FLONASE) 50 MCG/ACT nasal spray Place 1-2 sprays into both nostrils daily.     [provider]  Folate-B12-Intrinsic Factor (INTRINSI B12-FOLATE) 093-818-29 MCG-MCG-MG TABS Take 1 tablet by mouth daily. Reported on 04/06/2015    [provider]  furosemide (LASIX) 40 MG tablet Take 1 tablet (40 mg total) by mouth 2 (two) times daily. Patient taking differently: Take 40 mg by mouth daily.  10/26/16   Nicholes Mango, MD  gabapentin (NEURONTIN) 300 MG capsule Take 600 mg by mouth 4 (four) times daily.     [provider]  insulin detemir (LEVEMIR) 100 UNIT/ML injection Inject 0.25 mLs (25 Units total) into the skin 2 (two) times daily. Patient taking differently: Inject 50 Units into the skin 2 (two) times daily.  10/26/16   Gouru, Illene Silver, MD  ipratropium-albuterol (DUONEB) 0.5-2.5 (3) MG/3ML SOLN Take 3 mLs by nebulization every 4 (four) hours as needed. For shortness of breath/wheezing    [provider]  lactulose (CHRONULAC) 10 GM/15ML solution Take 30 mLs (20 g total) by mouth 3 (three) times daily. 10/26/16   Gouru, Illene Silver, MD  lidocaine (LIDODERM) 5 % Place 1 patch onto the skin daily. Remove & Discard patch within 12 hours or as directed by MD 12/16/16   Gregor Hams, MD  nystatin (MYCOSTATIN/NYSTOP) powder Apply 1 g topically 3 (three) times daily as needed. For irritation 10/26/16   Gouru, Illene Silver, MD  oxyCODONE (OXY IR/ROXICODONE) 5 MG immediate release tablet Take 1 tablet (5 mg total) by mouth every 6 (six) hours as needed for moderate pain. 12/21/16   Dustin Flock, MD  pantoprazole (PROTONIX) 40 MG tablet Take 40 mg by mouth 2 (two) times daily before a meal.    [provider]  potassium chloride SA (K-DUR,KLOR-CON) 20 MEQ tablet Take 10-20 mEq by mouth daily as needed. For high blood pressure    [provider]  promethazine (PHENERGAN) 12.5 MG tablet Take 2 tablets (25 mg total) by mouth every 6 (six) hours as needed for nausea or  vomiting. 09/25/16   Dustin Flock, MD  rifaximin (XIFAXAN) 550 MG TABS tablet Take 550 mg by mouth 2 (two) times daily.     [provider]  rizatriptan (MAXALT) 10 MG tablet Take 10 mg by mouth as needed for migraine.     [provider]  spironolactone (ALDACTONE) 100 MG tablet Take 100 mg by mouth daily.     [provider]  vitamin B-12 (CYANOCOBALAMIN) 1000 MCG tablet Take 2,000 mcg by mouth daily.    [provider]     Allergies Tape and Vicodin [hydrocodone-acetaminophen]  Family History  Problem Relation Age of Onset  . Lung cancer Mother   . Ulcers Father   . Emphysema Father   . Heart disease Sister   . Ulcers Sister   . Heart disease Brother     Social History Social History   Tobacco Use  . Smoking status: Never Smoker  . Smokeless tobacco: Never Used  Substance Use Topics  . Alcohol use: No    Comment:  occ  . Drug use: No    Review of Systems  Constitutional: Dizziness as above Eyes: No visual changes.  ENT: No sore throat. Cardiovascular: Denies chest pain. Respiratory: Denies shortness of breath. Gastrointestinal: No abdominal pain.  No nausea, no vomiting.   Genitourinary: Polyuria Musculoskeletal: Negative for back pain. Skin: Negative for rash. Neurological: Negative for headaches   ____________________________________________   PHYSICAL EXAM:  VITAL SIGNS: ED Triage Vitals  Enc Vitals Group     BP 01/09/17 1819 109/68     Pulse Rate 01/09/17 1819 (!) 111     Resp 01/09/17 1819 18     Temp 01/09/17 1819 98 F (36.7 C)     Temp Source 01/09/17 1819 Oral     SpO2 01/09/17 1819 99 %     Weight 01/09/17 1819 113.9 kg (251 lb)     Height 01/09/17 1819 1.575 m (5' 2" )     Head Circumference --      Peak Flow --      Pain Score 01/09/17 1818 8     Pain Loc --      Pain Edu? --      Excl. in [place]? --     Constitutional: Alert and oriented. No acute distress. Pleasant and interactive Eyes:  Conjunctivae are normal.   Nose: No congestion/rhinnorhea. Mouth/Throat: Mucous membranes are moist.    Cardiovascular: Normal rate, regular rhythm. Grossly normal heart sounds.  Good peripheral circulation. Respiratory: Normal respiratory effort.  No retractions. Gastrointestinal: Soft and nontender. No distention.   Genitourinary: deferred Musculoskeletal: Warm and well perfused Neurologic:  Normal speech and language. No gross focal neurologic deficits are appreciated.  Skin:  Skin is warm, dry and intact. No rash noted. Psychiatric: Mood and affect are normal. Speech and behavior are normal.  ____________________________________________   LABS (all labs ordered are listed, but only abnormal results are displayed)  Labs Reviewed  BASIC METABOLIC PANEL - Abnormal; Notable for the following components:      Result Value   Glucose, Bld 321 (*)    Calcium 8.8 (*)    All other components within normal limits  CBC - Abnormal; Notable for the following components:   MCV 101.2 (*)    MCH 34.5 (*)    Platelets 76 (*)    All other components within normal limits  URINALYSIS, COMPLETE (UACMP) WITH MICROSCOPIC - Abnormal; Notable for the following components:   Color, Urine YELLOW (*)    APPearance CLEAR (*)    Specific Gravity, Urine 1.035 (*)    Glucose, UA >=500 (*)    Bacteria, UA RARE (*)    Squamous Epithelial / LPF 0-5 (*)    All other components within normal limits  GLUCOSE, CAPILLARY - Abnormal; Notable for the following components:   Glucose-Capillary 340 (*)    All other components within normal limits  CBG MONITORING, ED  POC URINE PREG, ED   ____________________________________________  EKG  ED ECG REPORT I, Lavonia Drafts, the attending physician, personally viewed and interpreted this ECG.  Date: 01/09/2017  Rate:111 Rhythm: Sinus tachycardia QRS Axis: normal Intervals: normal ST/T Wave abnormalities: normal Narrative Interpretation: no evidence of  acute ischemia  ____________________________________________  RADIOLOGY None ____________________________________________   PROCEDURES  Procedure(s) performed: No  Procedures   Critical Care performed: No ____________________________________________   INITIAL IMPRESSION / ASSESSMENT AND PLAN / ED COURSE  Pertinent labs & imaging results that were available during my care of the patient were reviewed by me and considered in my  medical decision making (see chart for details).  Patient well-appearing in no acute distress.  Symptoms of mostly resolved upon my exam.  She feels her symptoms are related to elevated glucose and I am inclined to agree given overall reassuring workup.  She would like to go home and take her insulin and I think this is appropriate.  Anion gap is normal.    ____________________________________________   FINAL CLINICAL IMPRESSION(S) / ED DIAGNOSES  Final diagnoses:  Hyperglycemia        Note:  This document was prepared using Dragon voice recognition software and may include unintentional dictation errors.    Lavonia Drafts, MD 01/09/17 2251

## 2017-01-30 LAB — NM MYOCAR MULTI W/SPECT W/WALL MOTION / EF
Estimated workload: 1 METS
Exercise duration (min): 1 min
Exercise duration (sec): 15 s
LV dias vol: 82 mL (ref 46–106)
LV sys vol: 31 mL
Peak HR: 110 {beats}/min
Percent of predicted max HR: 65 %
Rest HR: 86 {beats}/min
SDS: 0
SRS: 0
SSS: 2
Stage 1 HR: 87 {beats}/min
Stage 2 Grade: 0 %
Stage 2 HR: 90 {beats}/min
Stage 2 Speed: 0 mph
Stage 3 Grade: 0 %
Stage 3 HR: 89 {beats}/min
Stage 3 Speed: 0 mph
Stage 4 Grade: 0 %
Stage 4 HR: 110 {beats}/min
Stage 4 Speed: 0 mph
Stage 5 Grade: 0 %
Stage 5 HR: 103 {beats}/min
Stage 5 Speed: 0 mph
Stage 6 DBP: 70 mmHg
Stage 6 Grade: 0 %
Stage 6 HR: 93 {beats}/min
Stage 6 SBP: 110 mmHg
Stage 6 Speed: 0 mph
TID: 1.03

## 2017-02-13 ENCOUNTER — Encounter: Payer: Self-pay | Admitting: Pain Medicine

## 2017-02-13 ENCOUNTER — Telehealth: Payer: Self-pay

## 2017-02-13 ENCOUNTER — Ambulatory Visit: Payer: Medicaid Other | Attending: Pain Medicine | Admitting: Pain Medicine

## 2017-02-13 VITALS — BP 124/88 | HR 94 | Temp 99.0°F | Resp 16 | Ht 62.0 in | Wt 260.0 lb

## 2017-02-13 DIAGNOSIS — K7460 Unspecified cirrhosis of liver: Secondary | ICD-10-CM | POA: Insufficient documentation

## 2017-02-13 DIAGNOSIS — M48061 Spinal stenosis, lumbar region without neurogenic claudication: Secondary | ICD-10-CM | POA: Insufficient documentation

## 2017-02-13 DIAGNOSIS — K219 Gastro-esophageal reflux disease without esophagitis: Secondary | ICD-10-CM | POA: Insufficient documentation

## 2017-02-13 DIAGNOSIS — G8929 Other chronic pain: Secondary | ICD-10-CM

## 2017-02-13 DIAGNOSIS — M5442 Lumbago with sciatica, left side: Secondary | ICD-10-CM

## 2017-02-13 DIAGNOSIS — R208 Other disturbances of skin sensation: Secondary | ICD-10-CM | POA: Diagnosis not present

## 2017-02-13 DIAGNOSIS — M792 Neuralgia and neuritis, unspecified: Secondary | ICD-10-CM | POA: Diagnosis not present

## 2017-02-13 DIAGNOSIS — Z885 Allergy status to narcotic agent status: Secondary | ICD-10-CM | POA: Insufficient documentation

## 2017-02-13 DIAGNOSIS — M25512 Pain in left shoulder: Secondary | ICD-10-CM

## 2017-02-13 DIAGNOSIS — Z79891 Long term (current) use of opiate analgesic: Secondary | ICD-10-CM | POA: Diagnosis not present

## 2017-02-13 DIAGNOSIS — Z9889 Other specified postprocedural states: Secondary | ICD-10-CM | POA: Diagnosis not present

## 2017-02-13 DIAGNOSIS — F329 Major depressive disorder, single episode, unspecified: Secondary | ICD-10-CM | POA: Diagnosis not present

## 2017-02-13 DIAGNOSIS — I129 Hypertensive chronic kidney disease with stage 1 through stage 4 chronic kidney disease, or unspecified chronic kidney disease: Secondary | ICD-10-CM | POA: Diagnosis not present

## 2017-02-13 DIAGNOSIS — M47812 Spondylosis without myelopathy or radiculopathy, cervical region: Secondary | ICD-10-CM | POA: Insufficient documentation

## 2017-02-13 DIAGNOSIS — M25511 Pain in right shoulder: Secondary | ICD-10-CM | POA: Diagnosis not present

## 2017-02-13 DIAGNOSIS — Z9049 Acquired absence of other specified parts of digestive tract: Secondary | ICD-10-CM | POA: Diagnosis not present

## 2017-02-13 DIAGNOSIS — Z888 Allergy status to other drugs, medicaments and biological substances status: Secondary | ICD-10-CM | POA: Insufficient documentation

## 2017-02-13 DIAGNOSIS — F419 Anxiety disorder, unspecified: Secondary | ICD-10-CM | POA: Insufficient documentation

## 2017-02-13 DIAGNOSIS — J441 Chronic obstructive pulmonary disease with (acute) exacerbation: Secondary | ICD-10-CM | POA: Diagnosis not present

## 2017-02-13 DIAGNOSIS — M4804 Spinal stenosis, thoracic region: Secondary | ICD-10-CM | POA: Diagnosis not present

## 2017-02-13 DIAGNOSIS — M542 Cervicalgia: Secondary | ICD-10-CM | POA: Diagnosis not present

## 2017-02-13 DIAGNOSIS — N189 Chronic kidney disease, unspecified: Secondary | ICD-10-CM | POA: Insufficient documentation

## 2017-02-13 DIAGNOSIS — M549 Dorsalgia, unspecified: Secondary | ICD-10-CM | POA: Insufficient documentation

## 2017-02-13 DIAGNOSIS — E1122 Type 2 diabetes mellitus with diabetic chronic kidney disease: Secondary | ICD-10-CM | POA: Insufficient documentation

## 2017-02-13 DIAGNOSIS — M5441 Lumbago with sciatica, right side: Secondary | ICD-10-CM

## 2017-02-13 DIAGNOSIS — G894 Chronic pain syndrome: Secondary | ICD-10-CM | POA: Diagnosis not present

## 2017-02-13 DIAGNOSIS — Z90710 Acquired absence of both cervix and uterus: Secondary | ICD-10-CM | POA: Insufficient documentation

## 2017-02-13 DIAGNOSIS — M818 Other osteoporosis without current pathological fracture: Secondary | ICD-10-CM | POA: Insufficient documentation

## 2017-02-13 DIAGNOSIS — M79671 Pain in right foot: Secondary | ICD-10-CM

## 2017-02-13 DIAGNOSIS — M79672 Pain in left foot: Secondary | ICD-10-CM

## 2017-02-13 DIAGNOSIS — G5603 Carpal tunnel syndrome, bilateral upper limbs: Secondary | ICD-10-CM | POA: Insufficient documentation

## 2017-02-13 DIAGNOSIS — Z794 Long term (current) use of insulin: Secondary | ICD-10-CM | POA: Insufficient documentation

## 2017-02-13 DIAGNOSIS — Z79899 Other long term (current) drug therapy: Secondary | ICD-10-CM | POA: Insufficient documentation

## 2017-02-13 NOTE — Progress Notes (Signed)
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 left-sided L5-S1 lumbar epiduralsteroid injection  Diagnostic left S1 selective nerveroot block Diagnostic right-sided cervical epiduralsteroid injection Diagnostic T10-11 thoracic epiduralsteroid injection Diagnostic bilateral intra-articular hipjoint injection Possible bilateral hip radiofrequencyablation   Palliative PRN treatment(s):   Diagnostic L1-2 lumbar epidural steroidinjection Diagnostic, right-sided L4-5 lumbar epidural steroidinjection Diagnostic T10-11 thoracic epidural steroidinjection    Provider-requested follow-up: No Follow-up on file.  No future appointments. Primary Care Physician: Ardine Eng, MD Location: [facility] Note by: Gaspar Cola, MD Date: 02/13/2017; Time: 12:16 PM

## 2017-02-13 NOTE — Patient Instructions (Signed)
____________________________________________________________________________________________  Pain Scale  Introduction: The pain score used by this practice is the Verbal Numerical Rating Scale (VNRS-11). This is an 11-point scale. It is for adults and children 10 years or older. There are significant differences in how the pain score is reported, used, and applied. Forget everything you learned in the past and learn this scoring system.  General Information: The scale should reflect your current level of pain. Unless you are specifically asked for the level of your worst pain, or your average pain. If you are asked for one of these two, then it should be understood that it is over the past 24 hours.  Basic Activities of Daily Living (ADL): Personal hygiene, dressing, eating, transferring, and using restroom.  Instructions: Most patients tend to report their level of pain as a combination of two factors, their physical pain and their psychosocial pain. This last one is also known as "suffering" and it is reflection of how physical pain affects you socially and psychologically. From now on, report them separately. From this point on, when asked to report your pain level, report only your physical pain. Use the following table for reference.  Pain Clinic Pain Levels (0-5/10)  Pain Level Score  Description  No Pain 0   Mild pain 1 Nagging, annoying, but does not interfere with basic activities of daily living (ADL). Patients are able to eat, bathe, get dressed, toileting (being able to get on and off the toilet and perform personal hygiene functions), transfer (move in and out of bed or a chair without assistance), and maintain continence (able to control bladder and bowel functions). Blood pressure and heart rate are unaffected. A normal heart rate for a healthy adult ranges from 60 to 100 bpm (beats per minute).   Mild to moderate pain 2 Noticeable and distracting. Impossible to hide from other  people. More frequent flare-ups. Still possible to adapt and function close to normal. It can be very annoying and may have occasional stronger flare-ups. With discipline, patients may get used to it and adapt.   Moderate pain 3 Interferes significantly with activities of daily living (ADL). It becomes difficult to feed, bathe, get dressed, get on and off the toilet or to perform personal hygiene functions. Difficult to get in and out of bed or a chair without assistance. Very distracting. With effort, it can be ignored when deeply involved in activities.   Moderately severe pain 4 Impossible to ignore for more than a few minutes. With effort, patients may still be able to manage work or participate in some social activities. Very difficult to concentrate. Signs of autonomic nervous system discharge are evident: dilated pupils (mydriasis); mild sweating (diaphoresis); sleep interference. Heart rate becomes elevated (>115 bpm). Diastolic blood pressure (lower number) rises above 100 mmHg. Patients find relief in laying down and not moving.   Severe pain 5 Intense and extremely unpleasant. Associated with frowning face and frequent crying. Pain overwhelms the senses.  Ability to do any activity or maintain social relationships becomes significantly limited. Conversation becomes difficult. Pacing back and forth is common, as getting into a comfortable position is nearly impossible. Pain wakes you up from deep sleep. Physical signs will be obvious: pupillary dilation; increased sweating; goosebumps; brisk reflexes; cold, clammy hands and feet; nausea, vomiting or dry heaves; loss of appetite; significant sleep disturbance with inability to fall asleep or to remain asleep. When persistent, significant weight loss is observed due to the complete loss of appetite and sleep deprivation.  Blood  pressure and heart rate becomes significantly elevated. Caution: If elevated blood pressure triggers a pounding headache  associated with blurred vision, then the patient should immediately seek attention at an urgent or emergency care unit, as these may be signs of an impending stroke.    Emergency Department Pain Levels (6-10/10)  Emergency Room Pain 6 Severely limiting. Requires emergency care and should not be seen or managed at an outpatient pain management facility. Communication becomes difficult and requires great effort. Assistance to reach the emergency department may be required. Facial flushing and profuse sweating along with potentially dangerous increases in heart rate and blood pressure will be evident.   Distressing pain 7 Self-care is very difficult. Assistance is required to transport, or use restroom. Assistance to reach the emergency department will be required. Tasks requiring coordination, such as bathing and getting dressed become very difficult.   Disabling pain 8 Self-care is no longer possible. At this level, pain is disabling. The individual is unable to do even the most "basic" activities such as walking, eating, bathing, dressing, transferring to a bed, or toileting. Fine motor skills are lost. It is difficult to think clearly.   Incapacitating pain 9 Pain becomes incapacitating. Thought processing is no longer possible. Difficult to remember your own name. Control of movement and coordination are lost.   The worst pain imaginable 10 At this level, most patients pass out from pain. When this level is reached, collapse of the autonomic nervous system occurs, leading to a sudden drop in blood pressure and heart rate. This in turn results in a temporary and dramatic drop in blood flow to the brain, leading to a loss of consciousness. Fainting is one of the body's self defense mechanisms. Passing out puts the brain in a calmed state and causes it to shut down for a while, in order to begin the healing process.    Summary: 1. Refer to this scale when providing [place] with your pain level. 2. Be  accurate and careful when reporting your pain level. This will help with your care. 3. Over-reporting your pain level will lead to loss of credibility. 4. Even a level of 1/10 means that there is pain and will be treated at our facility. 5. High, inaccurate reporting will be documented as "Symptom Exaggeration", leading to loss of credibility and suspicions of possible secondary gains such as obtaining more narcotics, or wanting to appear disabled, for fraudulent reasons. 6. Only pain levels of 5 or below will be seen at our facility. 7. Pain levels of 6 and above will be sent to the Emergency Department and the appointment cancelled. ____________________________________________________________________________________________
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
## 2017-02-18 ENCOUNTER — Emergency Department: Payer: Medicaid Other

## 2017-02-18 ENCOUNTER — Other Ambulatory Visit: Payer: Self-pay

## 2017-02-18 ENCOUNTER — Emergency Department
Admission: EM | Admit: 2017-02-18 | Discharge: 2017-02-18 | Disposition: A | Discharge status: Home / Self Care | Payer: Medicaid Other | Attending: Emergency Medicine | Admitting: Emergency Medicine

## 2017-02-18 DIAGNOSIS — E1165 Type 2 diabetes mellitus with hyperglycemia: Secondary | ICD-10-CM | POA: Insufficient documentation

## 2017-02-18 DIAGNOSIS — J45909 Unspecified asthma, uncomplicated: Secondary | ICD-10-CM | POA: Insufficient documentation

## 2017-02-18 DIAGNOSIS — I10 Essential (primary) hypertension: Secondary | ICD-10-CM | POA: Diagnosis not present

## 2017-02-18 DIAGNOSIS — Z79899 Other long term (current) drug therapy: Secondary | ICD-10-CM | POA: Insufficient documentation

## 2017-02-18 DIAGNOSIS — B349 Viral infection, unspecified: Secondary | ICD-10-CM | POA: Insufficient documentation

## 2017-02-18 DIAGNOSIS — J449 Chronic obstructive pulmonary disease, unspecified: Secondary | ICD-10-CM | POA: Diagnosis not present

## 2017-02-18 DIAGNOSIS — R1010 Upper abdominal pain, unspecified: Secondary | ICD-10-CM | POA: Diagnosis present

## 2017-02-18 DIAGNOSIS — R739 Hyperglycemia, unspecified: Secondary | ICD-10-CM

## 2017-02-18 DIAGNOSIS — Z794 Long term (current) use of insulin: Secondary | ICD-10-CM | POA: Insufficient documentation

## 2017-02-18 DIAGNOSIS — R1013 Epigastric pain: Secondary | ICD-10-CM | POA: Diagnosis not present

## 2017-02-18 DIAGNOSIS — E039 Hypothyroidism, unspecified: Secondary | ICD-10-CM | POA: Insufficient documentation

## 2017-02-18 LAB — URINALYSIS, COMPLETE (UACMP) WITH MICROSCOPIC
Bacteria, UA: NONE SEEN
Bilirubin Urine: NEGATIVE
Glucose, UA: >=500 mg/dL — AB
Hgb urine dipstick: NEGATIVE
Ketones, ur: NEGATIVE mg/dL
Leukocytes, UA: NEGATIVE
Nitrite: NEGATIVE
Protein, ur: NEGATIVE mg/dL
Specific Gravity, Urine: 1.026 (ref 1.005–1.030)
pH: 5 (ref 5.0–8.0)

## 2017-02-18 LAB — BASIC METABOLIC PANEL
Anion gap: 14 (ref 5–15)
BUN: 9 mg/dL (ref 6–20)
CO2: 23 mmol/L (ref 22–32)
Calcium: 9.4 mg/dL (ref 8.9–10.3)
Chloride: 97 mmol/L — ABNORMAL LOW (ref 101–111)
Creatinine, Ser: 0.74 mg/dL (ref 0.44–1.00)
GFR calc Af Amer: >60 mL/min (ref >60)
GFR calc non Af Amer: >60 mL/min (ref >60)
Glucose, Bld: 500 mg/dL — ABNORMAL HIGH (ref 65–99)
Potassium: 3.5 mmol/L (ref 3.5–5.1)
Sodium: 134 mmol/L — ABNORMAL LOW (ref 135–145)

## 2017-02-18 LAB — CBC
HCT: 48.2 % — ABNORMAL HIGH (ref 35.0–47.0)
Hemoglobin: 16.3 g/dL — ABNORMAL HIGH (ref 12.0–16.0)
MCH: 34.4 pg — ABNORMAL HIGH (ref 26.0–34.0)
MCHC: 33.8 g/dL (ref 32.0–36.0)
MCV: 101.9 fL — ABNORMAL HIGH (ref 80.0–100.0)
Platelets: 94 10*3/uL — ABNORMAL LOW (ref 150–440)
RBC: 4.73 MIL/uL (ref 3.80–5.20)
RDW: 14.5 % (ref 11.5–14.5)
WBC: 4.8 10*3/uL (ref 3.6–11.0)

## 2017-02-18 LAB — PROTIME-INR
INR: 1.14
Prothrombin Time: 14.5 seconds (ref 11.4–15.2)

## 2017-02-18 LAB — HEPATIC FUNCTION PANEL
ALT: 75 U/L — ABNORMAL HIGH (ref 14–54)
AST: 142 U/L — ABNORMAL HIGH (ref 15–41)
Albumin: 4.2 g/dL (ref 3.5–5.0)
Alkaline Phosphatase: 86 U/L (ref 38–126)
Bilirubin, Direct: 0.4 mg/dL (ref 0.1–0.5)
Indirect Bilirubin: 1.2 mg/dL — ABNORMAL HIGH (ref 0.3–0.9)
Total Bilirubin: 1.6 mg/dL — ABNORMAL HIGH (ref 0.3–1.2)
Total Protein: 7.4 g/dL (ref 6.5–8.1)

## 2017-02-18 LAB — INFLUENZA PANEL BY PCR (TYPE A & B)
Influenza A By PCR: NEGATIVE
Influenza B By PCR: NEGATIVE

## 2017-02-18 LAB — GLUCOSE, CAPILLARY
Glucose-Capillary: 487 mg/dL — ABNORMAL HIGH (ref 65–99)
Glucose-Capillary: 333 mg/dL — ABNORMAL HIGH (ref 65–99)

## 2017-02-18 LAB — AMMONIA: Ammonia: 43 umol/L — ABNORMAL HIGH (ref 9–35)

## 2017-02-18 LAB — TROPONIN I: Troponin I: <0.03 ng/mL (ref <0.03)

## 2017-02-18 LAB — LIPASE, BLOOD: Lipase: 27 U/L (ref 11–51)

## 2017-02-18 LAB — POCT PREGNANCY, URINE: Preg Test, Ur: NEGATIVE

## 2017-02-18 MED ORDER — FENTANYL CITRATE (PF) 100 MCG/2ML IJ SOLN
25.0000 ug | Freq: Once | INTRAMUSCULAR | Status: AC
  Administered 2017-02-18: 25 ug via INTRAVENOUS
  Filled 2017-02-18: qty 2

## 2017-02-18 MED ORDER — FENTANYL CITRATE (PF) 100 MCG/2ML IJ SOLN
50.0000 ug | Freq: Once | INTRAMUSCULAR | Status: AC
  Administered 2017-02-18: 50 ug via INTRAVENOUS
  Filled 2017-02-18: qty 2

## 2017-02-18 MED ORDER — OXYCODONE HCL 5 MG PO TABS: 5.0000 mg | ORAL_TABLET | Freq: Three times a day (TID) | ORAL | 0 refills | Status: DC | PRN

## 2017-02-18 MED ORDER — ONDANSETRON HCL 4 MG/2ML IJ SOLN
4.0000 mg | Freq: Once | INTRAMUSCULAR | Status: DC
  Filled 2017-02-18: qty 2

## 2017-02-18 MED ORDER — SODIUM CHLORIDE 0.9 % IV BOLUS (SEPSIS)
500.0000 mL | Freq: Once | INTRAVENOUS | Status: AC
  Administered 2017-02-18: 500 mL via INTRAVENOUS

## 2017-02-18 MED ORDER — ACETAMINOPHEN 500 MG PO TABS
1000.0000 mg | ORAL_TABLET | Freq: Once | ORAL | Status: AC
Start: 2017-02-18 — End: 2017-02-18
  Administered 2017-02-18: 1000 mg via ORAL
  Filled 2017-02-18: qty 2

## 2017-02-18 MED ORDER — IBUPROFEN 600 MG PO TABS
600.0000 mg | ORAL_TABLET | Freq: Once | ORAL | Status: AC
  Administered 2017-02-18: 600 mg via ORAL
  Filled 2017-02-18: qty 1

## 2017-02-18 MED ORDER — ACETAMINOPHEN 325 MG PO TABS
650.0000 mg | ORAL_TABLET | Freq: Once | ORAL | Status: AC | PRN
  Administered 2017-02-18: 650 mg via ORAL
  Filled 2017-02-18: qty 2

## 2017-02-18 MED ORDER — IOPAMIDOL (ISOVUE-300) INJECTION 61%
100.0000 mL | Freq: Once | INTRAVENOUS | Status: AC | PRN
  Administered 2017-02-18: 100 mL via INTRAVENOUS

## 2017-02-18 MED ORDER — ONDANSETRON HCL 4 MG/2ML IJ SOLN
4.0000 mg | Freq: Once | INTRAMUSCULAR | Status: AC | PRN
  Administered 2017-02-18: 4 mg via INTRAVENOUS
  Filled 2017-02-18: qty 2

## 2017-02-18 NOTE — ED Notes (Signed)
Pt c/o nausea and pain to epigastric area radiating to back.  Dr. Quentin Cornwall informed and verbal orders given.

## 2017-02-18 NOTE — Discharge Instructions (Signed)
Please take your pain medication only as needed for severe symptoms and keep your appointment to follow-up with your primary care physician this coming Tuesday as scheduled.  Return to the emergency department sooner for any new or worsening symptoms such as worsening pain, if you cannot eat or drink, or for any other issues whatsoever.  It was a pleasure to take care of you today, and thank you for coming to our emergency department.  If you have any questions or concerns before leaving please ask the nurse to grab me and I'm more than happy to go through your aftercare instructions again.  If you were prescribed any opioid pain medication today such as Norco, Vicodin, Percocet, morphine, hydrocodone, or oxycodone please make sure you do not drive when you are taking this medication as it can alter your ability to drive safely.  If you have any concerns once you are home that you are not improving or are in fact getting worse before you can make it to your follow-up appointment, please do not hesitate to call 911 and come back for further evaluation.  Darel Hong, MD  Results for orders placed or performed during the hospital encounter of 46/56/81  Basic metabolic panel  Result Value Ref Range   Sodium 134 (L) 135 - 145 mmol/L   Potassium 3.5 3.5 - 5.1 mmol/L   Chloride 97 (L) 101 - 111 mmol/L   CO2 23 22 - 32 mmol/L   Glucose, Bld 500 (H) 65 - 99 mg/dL   BUN 9 6 - 20 mg/dL   Creatinine, Ser 0.74 0.44 - 1.00 mg/dL   Calcium 9.4 8.9 - 10.3 mg/dL   GFR calc non Af Amer >60 >60 mL/min   GFR calc Af Amer >60 >60 mL/min   Anion gap 14 5 - 15  CBC  Result Value Ref Range   WBC 4.8 3.6 - 11.0 K/uL   RBC 4.73 3.80 - 5.20 MIL/uL   Hemoglobin 16.3 (H) 12.0 - 16.0 g/dL   HCT 48.2 (H) 35.0 - 47.0 %   MCV 101.9 (H) 80.0 - 100.0 fL   MCH 34.4 (H) 26.0 - 34.0 pg   MCHC 33.8 32.0 - 36.0 g/dL   RDW 14.5 11.5 - 14.5 %   Platelets 94 (L) 150 - 440 K/uL  Urinalysis, Complete w Microscopic  Result  Value Ref Range   Color, Urine YELLOW (A) YELLOW   APPearance HAZY (A) CLEAR   Specific Gravity, Urine 1.026 1.005 - 1.030   pH 5.0 5.0 - 8.0   Glucose, UA >=500 (A) NEGATIVE mg/dL   Hgb urine dipstick NEGATIVE NEGATIVE   Bilirubin Urine NEGATIVE NEGATIVE   Ketones, ur NEGATIVE NEGATIVE mg/dL   Protein, ur NEGATIVE NEGATIVE mg/dL   Nitrite NEGATIVE NEGATIVE   Leukocytes, UA NEGATIVE NEGATIVE   RBC / HPF 0-5 0 - 5 RBC/hpf   WBC, UA 0-5 0 - 5 WBC/hpf   Bacteria, UA NONE SEEN NONE SEEN   Squamous Epithelial / LPF 6-30 (A) NONE SEEN   Mucus PRESENT   Troponin I  Result Value Ref Range   Troponin I <0.03 <0.03 ng/mL  Glucose, capillary  Result Value Ref Range   Glucose-Capillary 487 (H) 65 - 99 mg/dL  Glucose, capillary  Result Value Ref Range   Glucose-Capillary 333 (H) 65 - 99 mg/dL  Hepatic function panel  Result Value Ref Range   Total Protein 7.4 6.5 - 8.1 g/dL   Albumin 4.2 3.5 - 5.0 g/dL  AST 142 (H) 15 - 41 U/L   ALT 75 (H) 14 - 54 U/L   Alkaline Phosphatase 86 38 - 126 U/L   Total Bilirubin 1.6 (H) 0.3 - 1.2 mg/dL   Bilirubin, Direct 0.4 0.1 - 0.5 mg/dL   Indirect Bilirubin 1.2 (H) 0.3 - 0.9 mg/dL  Lipase, blood  Result Value Ref Range   Lipase 27 11 - 51 U/L  Protime-INR  Result Value Ref Range   Prothrombin Time 14.5 11.4 - 15.2 seconds   INR 1.14   Ammonia  Result Value Ref Range   Ammonia 43 (H) 9 - 35 umol/L  Influenza panel by PCR (type A & B)  Result Value Ref Range   Influenza A By PCR NEGATIVE NEGATIVE   Influenza B By PCR NEGATIVE NEGATIVE  Pregnancy, urine POC  Result Value Ref Range   Preg Test, Ur NEGATIVE NEGATIVE   Dg Chest 2 View  Result Date: 02/18/2017 CLINICAL DATA:  Hyperglycemia.  Chest pain and back pain. EXAM: CHEST  2 VIEW COMPARISON:  December 20, 2016 FINDINGS: The heart size and mediastinal contours are within normal limits. Both lungs are clear. The visualized skeletal structures are unremarkable. IMPRESSION: No active  cardiopulmonary disease. Electronically Signed   By: Dorise Bullion III M.D   On: 02/18/2017 14:52   Ct Abdomen Pelvis W Contrast  Result Date: 02/18/2017 CLINICAL DATA:  52 y/o F; chest pain, back pain, right kidney pain, nausea, vomiting, and epigastric pain radiating to the back. EXAM: CT ABDOMEN AND PELVIS WITH CONTRAST TECHNIQUE: Multidetector CT imaging of the abdomen and pelvis was performed using the standard protocol following bolus administration of intravenous contrast. CONTRAST:  175m ISOVUE-300 IOPAMIDOL (ISOVUE-300) INJECTION 61% COMPARISON:  10/02/2016 CT of abdomen and pelvis. FINDINGS: Lower chest: No acute abnormality. Hepatobiliary: Cholecystectomy. Cirrhotic liver. Stable hepatic cysts. Interval resolution of pneumobilia. Common bile duct measures up to 10 mm, likely compensatory post cholecystectomy. No intrahepatic biliary ductal dilatation. Pancreas: Unremarkable. No pancreatic ductal dilatation or surrounding inflammatory changes. Spleen: Spleen measures 12.6 x 6.5 x 19.2 Cm (volume = 820 cm^3)(series 5, image 57 and series 2, image 27), previously 12.9 x 6.9 x 20.1 cm (volume = 940 cm^3) measured in similar fashion. Adrenals/Urinary Tract: Adrenal glands are unremarkable. Kidneys are normal, without renal calculi, focal lesion, or hydronephrosis. Air within the bladder, question recent instrumentation versus cystitis. Stomach/Bowel: Stomach is within normal limits. Appendix appears normal. No evidence of bowel wall thickening, distention, or inflammatory changes. Scattered sigmoid diverticulosis. Vascular/Lymphatic: Aortic atherosclerosis. No enlarged abdominal or pelvic lymph nodes. Reproductive: Status post hysterectomy. No adnexal masses. Other: Stable chronic postsurgical changes within the left lower anterior abdominal wall. Musculoskeletal: No fracture is seen. Mild lumbar levocurvature with apex at L3. IMPRESSION: 1. Cirrhotic liver. Mild decrease in splenomegaly 820 cc,  previously 940 cc. 2. Air within bladder, question recent instrumentation versus cystitis. 3. Scattered sigmoid diverticulosis, no findings of acute diverticulitis. Electronically Signed   By: LKristine GarbeM.D.   On: 02/18/2017 19:44

## 2017-02-18 NOTE — ED Notes (Signed)
Pt stating that she came in today because her "sugar was high, nausea, and I hurt." Pt stating pain in her left back and her left abdomen. Dr. Mable Paris at pt's bedside with bedside US checking pt's fluids at this time in her abdomen. Pt in NAD at this time. Family is at bedside.

## 2017-02-18 NOTE — ED Notes (Signed)
Pt tolerating oral with no problems.

## 2017-02-18 NOTE — ED Provider Notes (Signed)
[facility] Emergency Department Provider Note  ____________________________________________   First MD Initiated Contact with Patient 02/18/17 1658     (approximate)  I have reviewed the triage vital signs and the nursing notes.   HISTORY  Chief Complaint Hyperglycemia   HPI Lori Mckenzie is a 52 y.o. female who presents to the emergency department with 1 day of moderate to severe cramping upper abdominal pain radiating to her back associated with nausea.  She also reports low-grade fever at home.  She reports nausea but no vomiting.  No diarrhea.  She reports generalized malaise.  Her pain is intermittent.  Nothing in particular seems to make it better or worse.  Past Medical History:  Diagnosis Date  . Abdominal abscess 08/25/2014  . Acid reflux 08/10/2010  . Acute cervical myofascial strain 02/09/2016  . Acute postoperative pain 08/09/2016  . Anxiety   . Ascites   . Asthma   . Back pain   . Bile leak, postoperative 07/17/2014  . Brittle bone disease   . Cancer ([place])    Uteriine  ca 52yr ago partial hysterectomy  . Cervical disc disease   . Chronic kidney disease   . Collagen vascular disease ([place]    RA  3-4 yrs ago  . COPD (chronic obstructive pulmonary disease) ([place]   . Diabetes mellitus without complication ([place]   . GERD (gastroesophageal reflux disease)   . Hypertension   . Hypothyroidism   . Left upper quadrant pain 01/09/2014  . Major depressive disorder with single episode 12/05/2011  . Major depressive disorder, single episode 12/05/2011  . Migraines   . NASH (nonalcoholic steatohepatitis)   . Respiratory infection    2/17  . Shock ([place] 09/18/2014  . Sleep apnea   . Sleep apnea   . Syncope 11/16/2014  . Thyroid disease   . TIA (transient ischemic attack)     Patient Active Problem List   Diagnosis Date Noted  . Neurogenic pain 02/13/2017  . Chest pain 12/20/2016  . Chronic feet pain (Secondary source of pain) (Bilateral) (R>L)  10/26/2016  . Acute hepatic encephalopathy 09/23/2016  . Low back pain due to L1-2 disc extrusion (caudal) (Left) 07/11/2016  . Abdominal pain 06/28/2016  . Constipation 06/20/2016  . Hyperglycemia 06/20/2016  . Nausea without vomiting 06/06/2016  . Chronic lower extremity pain (Bilateral) 05/19/2016  . Spinal stenosis, thoracic region (T10-11) 05/19/2016  . Diabetes mellitus, insulin dependent (IDDM), uncontrolled ([place] 04/12/2016  . Chronic sacroiliac joint pain (Bilateral) (L>R) 03/23/2016  . Chronic upper extremity pain (Left) 03/10/2016  . Chronic radicular cervical pain (L) 03/10/2016  . Osteoarthritis 02/24/2016  . Allodynia 02/09/2016  . Chronic pain syndrome 01/07/2016  . Opiate withdrawal ([place] 12/22/2015  . Elevated liver enzymes 09/28/2015  . Cirrhosis of liver without ascites (HG[place] 09/24/2015  . Cirrhosis ([place] 09/24/2015  . Altered mental status 07/24/2015  . Uncontrolled diabetes mellitus ([place] 07/24/2015  . Radicular pain of thoracic region 04/06/2015  . Hepatic encephalopathy ([place] 03/11/2015  . Elevated sedimentation rate 03/05/2015  . Elevated C-reactive protein (CRP) 03/05/2015  . Lumbar facet syndrome (Bilateral) (R>L) 03/05/2015  . Cervical spondylosis 03/05/2015  . Lumbar spondylosis 03/05/2015  . Encounter for chronic pain management 03/05/2015  . Chronic shoulder pain (Bilateral) (R>L) 03/05/2015  . Chronic carpal tunnel syndrome (Bilateral) 03/05/2015  . Chronic hip pain (Bilateral) (L>R) 03/05/2015  . Chronic upper back pain (Bilateral) (L>R) 03/05/2015  . Osteoporosis, idiopathic 03/05/2015  . Abnormal MRI, lumbar spine (02/03/2015) 03/05/2015  .  Thoracic radiculitis 02/05/2015  . Vitamin D deficiency 01/13/2015  . B12 deficiency 01/13/2015  . Folate deficiency 01/13/2015  . Subacute lumbar radiculopathy (left side) (S1 dermatome) 12/10/2014  . Drowsiness 11/16/2014  . Episode of syncope 11/16/2014  . Somnolence 11/16/2014  . Opiate use (75  MME/Day) 10/28/2014  . Long term prescription opiate use 10/28/2014  . Long term current use of opiate analgesic 10/28/2014  . Encounter for therapeutic drug level monitoring 10/28/2014  . Chronic epigastric abdominal pain 10/28/2014  . Chronic low back pain (Primary Source of Pain) (Bilateral) (R>L) 10/28/2014  . Chronic neck pain [facility] source of pain) (Bilateral) (R>L) 10/28/2014  . Ascites 09/05/2014  . NASH (nonalcoholic steatohepatitis) 09/05/2014  . Hypokalemia 09/05/2014  . Dysthymia 08/05/2014  . Other social stressor 08/05/2014  . Steatohepatitis 07/15/2014  . Type 2 diabetes mellitus ([place]) 07/12/2014  . COPD with acute exacerbation ([place]) 07/12/2014  . GERD (gastroesophageal reflux disease) 07/12/2014  . OSA on CPAP 07/12/2014  . Anxiety 07/12/2014  . 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 Lumbar and sacral osteoarthritis 03/26/2014  . Myofascial pain 03/26/2014  . Lumbar spinal stenosis 03/26/2014  . Lumbosacral spondylosis without myelopathy 03/26/2014  . 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 Lumbar central spinal stenosis (L1-2 and L4-5) 03/26/2014  . Spondylosis of lumbar region without myelopathy or radiculopathy 03/26/2014  . Breath shortness 01/09/2014  . Diastolic dysfunction with chronic heart failure ([place]) 08/06/2013  . Airway hyperreactivity 08/06/2013  . Essential (primary) hypertension 08/06/2013  . Asthma 08/06/2013  . Clinical depression 12/05/2011    Past Surgical History:  Procedure Laterality Date  . ABDOMINAL HYSTERECTOMY    . CHOLECYSTECTOMY N/A 07/15/2014   Procedure: LAPAROSCOPIC CHOLECYSTECTOMY with liver biopsy ;  Surgeon: Sherri Rad, MD;  Location: [facility] ORS;  Service: General;  Laterality: N/A;  . COLONOSCOPY WITH PROPOFOL N/A 06/23/2014   Procedure: COLONOSCOPY WITH PROPOFOL;  Surgeon: Lollie Sails, MD;  Location[facility] ENDOSCOPY;  Service: Endoscopy;  Laterality: N/A;  . ERCP N/A 07/16/2014   Procedure: ENDOSCOPIC  RETROGRADE CHOLANGIOPANCREATOGRAPHY (ERCP);  Surgeon: Clarene Essex, MD;  Location: [facility] END[facility];  Service: Endoscopy;  Laterality: N/A;  . ERCP N/A 10/03/2014   Procedure: ENDOSCOPIC RETROGRADE CHOLANGIOPANCREATOGRAPHY (ERCP);  Surgeon: Hulen Luster, MD;  Location[facility] END[facility]COPY;  Service: Gastroenterology;  Laterality: N/A;  . ESOPHAGOGASTRODUODENOSCOPY N/A 06/23/2014   Procedure: ESOPHAGOGASTRODUODENOSCOPY (EGD);  Surgeon: Lollie Sails, MD;  Location[facility] END[facility]COPY;  Service: Endoscopy;  Laterality: N/A;  . ESOPHAGOGASTRODUODENOSCOPY N/A 06/30/2016   Procedure: ESOPHAGOGASTRODUODENOSCOPY (EGD);  Surgeon: Jonathon Bellows, MD;  Location: [facility] ENDOSCOPY;  Service: Endoscopy;  Laterality: N/A;  . ESOPHAGOGASTRODUODENOSCOPY (EGD) WITH PROPOFOL N/A 12/01/2015   Procedure: ESOPHAGOGASTRODUODENOSCOPY (EGD) WITH PROPOFOL;  Surgeon: Lollie Sails, MD;  Location: [facility] ENDOSCOPY;  Service: Endoscopy;  Laterality: N/A;  . TUBAL LIGATION    . WISDOM TOOTH EXTRACTION      Prior to Admission medications   Medication Sig Start Date End Date Taking? Authorizing Provider  albuterol (PROVENTIL HFA;VENTOLIN HFA) 108 (90 Base) MCG/ACT inhaler Inhale 2 puffs into the lungs every 6 (six) hours as needed for wheezing or shortness of breath.    [provider]  ALPRAZolam Duanne Moron) 0.5 MG tablet Take 1 tablet (0.5 mg total) by mouth 3 (three) times daily as needed for sleep or anxiety. 12/21/16 12/21/17  Dustin Flock, MD  cholecalciferol (VITAMIN D) 1000 units tablet Take 2,000 Units by mouth daily.    [provider]  citalopram (CELEXA) 40 MG tablet Take  by mouth daily.  07/24/15   [provider]  diclofenac sodium (VOLTAREN) 1 % GEL Apply 4 g topically every 8 (eight) hours as needed. 12/06/16 06/04/17  Milinda Pointer, MD  fluticasone (FLONASE) 50 MCG/ACT nasal spray Place 1-2 sprays into both nostrils daily.     [provider]  Folate-B12-Intrinsic Factor (INTRINSI B12-FOLATE)  [phone] MCG-MCG-MG TABS Take 1 tablet by mouth daily. Reported on 04/06/2015    [provider]  furosemide (LASIX) 40 MG tablet Take 1 tablet (40 mg total) by mouth 2 (two) times daily. Patient taking differently: Take 40 mg by mouth daily.  10/26/16   Nicholes Mango, MD  gabapentin (NEURONTIN) 300 MG capsule Take 600 mg by mouth 4 (four) times daily.     [provider]  insulin detemir (LEVEMIR) 100 UNIT/ML injection Inject 0.25 mLs (25 Units total) into the skin 2 (two) times daily. Patient taking differently: Inject 50 Units into the skin 2 (two) times daily.  10/26/16   Gouru, Illene Silver, MD  ipratropium-albuterol (DUONEB) 0.5-2.5 (3) MG/3ML SOLN Take 3 mLs by nebulization every 4 (four) hours as needed. For shortness of breath/wheezing    [provider]  lactulose (CHRONULAC) 10 GM/15ML solution Take 30 mLs (20 g total) by mouth 3 (three) times daily. 10/26/16   Gouru, Illene Silver, MD  lidocaine (LIDODERM) 5 % Place 1 patch onto the skin daily. Remove & Discard patch within 12 hours or as directed by MD 12/16/16   Gregor Hams, MD  nystatin (MYCOSTATIN/NYSTOP) powder Apply 1 g topically 3 (three) times daily as needed. For irritation 10/26/16   Gouru, Illene Silver, MD  oxyCODONE (ROXICODONE) 5 MG immediate release tablet Take 1 tablet (5 mg total) by mouth every 8 (eight) hours as needed for up to 9 days. 02/18/17 02/27/17  Darel Hong, MD  pantoprazole (PROTONIX) 40 MG tablet Take 40 mg by mouth 2 (two) times daily before a meal.    [provider]  potassium chloride SA (K-DUR,KLOR-CON) 20 MEQ tablet Take 10-20 mEq by mouth daily as needed. For high blood pressure    [provider]  promethazine (PHENERGAN) 12.5 MG tablet Take 2 tablets (25 mg total) by mouth every 6 (six) hours as needed for nausea or vomiting. 09/25/16   Dustin Flock, MD  rifaximin (XIFAXAN) 550 MG TABS tablet Take 550 mg by mouth 2 (two) times daily.     [provider]    rizatriptan (MAXALT) 10 MG tablet Take 10 mg by mouth as needed for migraine.     [provider]  spironolactone (ALDACTONE) 100 MG tablet Take 100 mg by mouth daily.     [provider]  vitamin B-12 (CYANOCOBALAMIN) 1000 MCG tablet Take 2,000 mcg by mouth daily.    [provider]  SUMAtriptan (IMITREX) 50 MG tablet Take 50 mg by mouth every 2 (two) hours as needed. For migraines  03/17/11  [provider]    Allergies Tape and Vicodin [hydrocodone-acetaminophen]  Family History  Problem Relation Age of Onset  . Lung cancer Mother   . Ulcers Father   . Emphysema Father   . Heart disease Sister   . Ulcers Sister   . Heart disease Brother     Social History Social History   Tobacco Use  . Smoking status: Never Smoker  . Smokeless tobacco: Never Used  Substance Use Topics  . Alcohol use: No    Comment: occ  . Drug use: No    Review of Systems  Constitutional: Positive for fever Eyes: No visual changes. ENT: No sore throat. Cardiovascular: Denies chest pain. Respiratory: Denies shortness of breath. Gastrointestinal: Positive for abdominal pain.  Positive for nausea, no vomiting.  No diarrhea.  No constipation. Genitourinary: Negative for dysuria. Musculoskeletal: Negative for back pain. Skin: Negative for rash. Neurological: Negative for headaches, focal weakness or numbness.   ____________________________________________   PHYSICAL EXAM:  VITAL SIGNS: ED Triage Vitals  Enc Vitals Group     BP 02/18/17 1406 (!) 142/95     Pulse Rate 02/18/17 1406 (!) 131     Resp 02/18/17 1406 (!) 22     Temp 02/18/17 1406 (!) 100.8 F (38.2 C)     Temp Source 02/18/17 1406 Oral     SpO2 02/18/17 1406 94 %     Weight 02/18/17 1412 250 lb (113.4 kg)     Height 02/18/17 1412 5' 2"  (1.575 m)     Head Circumference --      Peak Flow --      Pain Score 02/18/17 1412 9     Pain Loc --      Pain Edu? --      Excl. in [place]? --      Constitutional: Alert and oriented x4 appears somewhat uncomfortable nontoxic no diaphoresis speaks in full clear sentences Eyes: PERRL EOMI. Head: Atraumatic. Nose: No congestion/rhinnorhea. Mouth/Throat: No trismus Neck: No stridor.   Cardiovascular: Tachycardic rate, regular rhythm. Grossly normal heart sounds.  Good peripheral circulation. Respiratory: Normal respiratory effort.  No retractions. Lungs CTAB and moving good air Gastrointestinal: Obese soft mild upper abdominal tenderness with no rebound or guarding no peritonitis no McBurney's tenderness and Rovsing's no costovertebral tenderness Musculoskeletal: No lower extremity edema   Neurologic:  Normal speech and language. No gross focal neurologic deficits are appreciated. Skin:  Skin is warm, dry and intact. No rash noted. Psychiatric: Mood and affect are normal. Speech and behavior are normal.    ____________________________________________   DIFFERENTIAL includes but not limited to  Pancreatitis, diabetic ketoacidosis, influenza, pyelonephritis ____________________________________________   LABS (all labs ordered are listed, but only abnormal results are displayed)  Labs Reviewed  BASIC METABOLIC PANEL - Abnormal; Notable for the following components:      Result Value   Sodium 134 (*)    Chloride 97 (*)    Glucose, Bld 500 (*)    All other components within normal limits  CBC - Abnormal; Notable for the following components:   Hemoglobin 16.3 (*)    HCT 48.2 (*)    MCV 101.9 (*)    MCH 34.4 (*)    Platelets 94 (*)    All other components within normal limits  URINALYSIS, COMPLETE (UACMP) WITH MICROSCOPIC - Abnormal; Notable for the following components:   Color, Urine YELLOW (*)    APPearance HAZY (*)    Glucose, UA >=500 (*)    Squamous Epithelial / LPF 6-30 (*)    All other components within normal limits  GLUCOSE, CAPILLARY - Abnormal; Notable for the following components:   Glucose-Capillary 487  (*)    All other components within normal limits  GLUCOSE, CAPILLARY - Abnormal; Notable for the following components:   Glucose-Capillary 333 (*)    All other components within normal limits  HEPATIC FUNCTION PANEL - Abnormal; Notable for the following components:   AST 142 (*)    ALT 75 (*)    Total Bilirubin 1.6 (*)    Indirect Bilirubin 1.2 (*)    All other  components within normal limits  AMMONIA - Abnormal; Notable for the following components:   Ammonia 43 (*)    All other components within normal limits  TROPONIN I  LIPASE, BLOOD  PROTIME-INR  INFLUENZA PANEL BY PCR (TYPE A & B)  CBG MONITORING, ED  POC URINE PREG, ED  POC URINE PREG, ED  POCT PREGNANCY, URINE    Lab work reviewed by me with slightly elevated transaminases consistent with known [name].  No evidence of diabetic ketoacidosis __________________________________________  EKG  ED ECG REPORT I, Darel Hong, the attending physician, personally viewed and interpreted this ECG.  Date: 02/18/2017 EKG Time:  Rate: 121 Rhythm: Sinus tachycardia QRS Axis: Leftward axis Intervals: normal ST/T Wave abnormalities: normal Narrative Interpretation: no evidence of acute ischemia  ____________________________________________  RADIOLOGY  Chest x-ray reviewed by me with no acute disease CT abdomen pelvis reviewed by me with no acute disease ____________________________________________   PROCEDURES  Procedure(s) performed: no  Procedures  Critical Care performed: no  Observation: no ____________________________________________   INITIAL IMPRESSION / ASSESSMENT AND PLAN / ED COURSE  Pertinent labs & imaging results that were available during my care of the patient were reviewed by me and considered in my medical decision making (see chart for details).  The patient arrives with low-grade fever abdominal pain nausea vomiting.  Differential is broad.  Fluids and abdominal labs are pending.     ----------------------------------------- 6:01 PM on 02/18/2017 -----------------------------------------  The patient's lipase just came back normal.  No clear source for her fever and she does report abdominal pain right flank pain.  CT scan of the abdomen pelvis is pending to evaluate for possible abdominal sepsis.  Defer antibiotics at this point as it is more likely she has influenza.  ____________________________________________   ----------------------------------------- 8:05 PM on 02/18/2017 -----------------------------------------  The patient's lab work is reassuring and she has no evidence of diabetic ketoacidosis.  She is influenza negative, and her CT scan is reassuring with no obvious etiology of her fever.  She is able to eat and drink and her abdominal exam is benign.  I had a lengthy discussion with the patient and family at bedside regarding the diagnostic uncertainty but that I did not feel she warranted antibiotics at this time.  She has follow-up with her primary care physician this coming Tuesday in 3 days.  We will treat her symptomatically with a short course of oxycodone for her pain and strict return precautions.  The patient and her family verbalized understanding and agreement with the plan.  FINAL CLINICAL IMPRESSION(S) / ED DIAGNOSES  Final diagnoses:  Hyperglycemia  Epigastric pain  Viral syndrome      NEW MEDICATIONS STARTED DURING THIS VISIT:  Discharge Medication List as of 02/18/2017  8:05 PM       Note:  This document was prepared using Dragon voice recognition software and may include unintentional dictation errors.     Darel Hong, MD 02/20/17 [phone]

## 2017-02-18 NOTE — ED Notes (Signed)
Pt given diet ginger ale for po challenge.

## 2017-02-18 NOTE — ED Triage Notes (Signed)
Pt arrives to ED c/o of hyperglycemia. Blurred vision bilaterally. States she takes insulin shots. CBG at home 200's-400's. Pt states she vomited. States CP and back pain. Also c/o R kidney pain. Alert, oriented, ambulatory.

## 2017-02-22 ENCOUNTER — Other Ambulatory Visit: Payer: Self-pay | Admitting: Family Medicine

## 2017-02-22 DIAGNOSIS — Z1239 Encounter for other screening for malignant neoplasm of breast: Secondary | ICD-10-CM

## 2017-02-26 ENCOUNTER — Encounter: Payer: Self-pay | Admitting: Emergency Medicine

## 2017-02-26 ENCOUNTER — Emergency Department: Payer: Medicaid Other

## 2017-02-26 ENCOUNTER — Other Ambulatory Visit: Payer: Self-pay

## 2017-02-26 ENCOUNTER — Emergency Department
Admission: EM | Admit: 2017-02-26 | Discharge: 2017-02-26 | Disposition: A | Discharge status: Home / Self Care | Payer: Medicaid Other | Attending: Emergency Medicine | Admitting: Emergency Medicine

## 2017-02-26 DIAGNOSIS — Z794 Long term (current) use of insulin: Secondary | ICD-10-CM | POA: Diagnosis not present

## 2017-02-26 DIAGNOSIS — F410 Panic disorder [episodic paroxysmal anxiety] without agoraphobia: Secondary | ICD-10-CM | POA: Insufficient documentation

## 2017-02-26 DIAGNOSIS — J45909 Unspecified asthma, uncomplicated: Secondary | ICD-10-CM | POA: Diagnosis not present

## 2017-02-26 DIAGNOSIS — Z8542 Personal history of malignant neoplasm of other parts of uterus: Secondary | ICD-10-CM | POA: Insufficient documentation

## 2017-02-26 DIAGNOSIS — E1122 Type 2 diabetes mellitus with diabetic chronic kidney disease: Secondary | ICD-10-CM | POA: Diagnosis not present

## 2017-02-26 DIAGNOSIS — Z79899 Other long term (current) drug therapy: Secondary | ICD-10-CM | POA: Diagnosis not present

## 2017-02-26 DIAGNOSIS — F329 Major depressive disorder, single episode, unspecified: Secondary | ICD-10-CM | POA: Diagnosis not present

## 2017-02-26 DIAGNOSIS — F419 Anxiety disorder, unspecified: Secondary | ICD-10-CM | POA: Insufficient documentation

## 2017-02-26 DIAGNOSIS — Z8673 Personal history of transient ischemic attack (TIA), and cerebral infarction without residual deficits: Secondary | ICD-10-CM | POA: Diagnosis not present

## 2017-02-26 DIAGNOSIS — I130 Hypertensive heart and chronic kidney disease with heart failure and stage 1 through stage 4 chronic kidney disease, or unspecified chronic kidney disease: Secondary | ICD-10-CM | POA: Insufficient documentation

## 2017-02-26 DIAGNOSIS — I5030 Unspecified diastolic (congestive) heart failure: Secondary | ICD-10-CM | POA: Diagnosis not present

## 2017-02-26 DIAGNOSIS — E039 Hypothyroidism, unspecified: Secondary | ICD-10-CM | POA: Insufficient documentation

## 2017-02-26 DIAGNOSIS — N189 Chronic kidney disease, unspecified: Secondary | ICD-10-CM | POA: Diagnosis not present

## 2017-02-26 DIAGNOSIS — Z9049 Acquired absence of other specified parts of digestive tract: Secondary | ICD-10-CM | POA: Diagnosis not present

## 2017-02-26 LAB — BASIC METABOLIC PANEL
Anion gap: 12 (ref 5–15)
BUN: 7 mg/dL (ref 6–20)
CO2: 23 mmol/L (ref 22–32)
Calcium: 9.2 mg/dL (ref 8.9–10.3)
Chloride: 101 mmol/L (ref 101–111)
Creatinine, Ser: 0.54 mg/dL (ref 0.44–1.00)
GFR calc Af Amer: >60 mL/min (ref >60)
GFR calc non Af Amer: >60 mL/min (ref >60)
Glucose, Bld: 350 mg/dL — ABNORMAL HIGH (ref 65–99)
Potassium: 3.9 mmol/L (ref 3.5–5.1)
Sodium: 136 mmol/L (ref 135–145)

## 2017-02-26 LAB — CBC
HCT: 43.5 % (ref 35.0–47.0)
Hemoglobin: 15 g/dL (ref 12.0–16.0)
MCH: 34.9 pg — ABNORMAL HIGH (ref 26.0–34.0)
MCHC: 34.6 g/dL (ref 32.0–36.0)
MCV: 101 fL — ABNORMAL HIGH (ref 80.0–100.0)
Platelets: 75 10*3/uL — ABNORMAL LOW (ref 150–440)
RBC: 4.3 MIL/uL (ref 3.80–5.20)
RDW: 14.4 % (ref 11.5–14.5)
WBC: 4.3 10*3/uL (ref 3.6–11.0)

## 2017-02-26 LAB — TROPONIN I: Troponin I: <0.03 ng/mL (ref <0.03)

## 2017-02-26 MED ORDER — LORAZEPAM 1 MG PO TABS
2.0000 mg | ORAL_TABLET | Freq: Once | ORAL | Status: AC
  Administered 2017-02-26: 2 mg via ORAL
  Filled 2017-02-26: qty 2

## 2017-02-26 MED ORDER — OXYCODONE HCL 10 MG PO TABS: 10.0000 mg | ORAL_TABLET | Freq: Three times a day (TID) | ORAL | 0 refills | Status: AC | PRN

## 2017-02-26 NOTE — ED Triage Notes (Addendum)
Here for chest pain that radiates to back. Has had similar in past and feels like when has panic attack.  Started 2 hrs ago after her sons told her they want to put her in a nursing home. Pt hyperventilating. Does have low grade temp in triage.  Denies cough.  Breathing eases as pt calms.  Ambulatory.  Is alert and oriented at this time. 6 months ago pt weaned her xanax to 1 mg TID from 2 mg TID

## 2017-02-26 NOTE — Discharge Instructions (Signed)
Please follow-up with both your primary care physician and your psychiatrist as needed and return to the emergency department for any concerns.  It was a pleasure to take care of you today, and thank you for coming to our emergency department.  If you have any questions or concerns before leaving please ask the nurse to grab me and I'm more than happy to go through your aftercare instructions again.  If you were prescribed any opioid pain medication today such as Norco, Vicodin, Percocet, morphine, hydrocodone, or oxycodone please make sure you do not drive when you are taking this medication as it can alter your ability to drive safely.  If you have any concerns once you are home that you are not improving or are in fact getting worse before you can make it to your follow-up appointment, please do not hesitate to call 911 and come back for further evaluation.  Darel Hong, MD  Results for orders placed or performed during the hospital encounter of 98/92/11  Basic metabolic panel  Result Value Ref Range   Sodium 136 135 - 145 mmol/L   Potassium 3.9 3.5 - 5.1 mmol/L   Chloride 101 101 - 111 mmol/L   CO2 23 22 - 32 mmol/L   Glucose, Bld 350 (H) 65 - 99 mg/dL   BUN 7 6 - 20 mg/dL   Creatinine, Ser 0.54 0.44 - 1.00 mg/dL   Calcium 9.2 8.9 - 10.3 mg/dL   GFR calc non Af Amer >60 >60 mL/min   GFR calc Af Amer >60 >60 mL/min   Anion gap 12 5 - 15  CBC  Result Value Ref Range   WBC 4.3 3.6 - 11.0 K/uL   RBC 4.30 3.80 - 5.20 MIL/uL   Hemoglobin 15.0 12.0 - 16.0 g/dL   HCT 43.5 35.0 - 47.0 %   MCV 101.0 (H) 80.0 - 100.0 fL   MCH 34.9 (H) 26.0 - 34.0 pg   MCHC 34.6 32.0 - 36.0 g/dL   RDW 14.4 11.5 - 14.5 %   Platelets 75 (L) 150 - 440 K/uL  Troponin I  Result Value Ref Range   Troponin I <0.03 <0.03 ng/mL   Dg Chest 2 View  Result Date: 02/26/2017 CLINICAL DATA:  Chest pain and shortness of breath beginning today. Vomiting. EXAM: CHEST  2 VIEW COMPARISON:  02/18/2017 FINDINGS: The  heart size and mediastinal contours are within normal limits. Both lungs are clear. The visualized skeletal structures are unremarkable. IMPRESSION: No active cardiopulmonary disease. Electronically Signed   By: Earle Gell M.D.   On: 02/26/2017 18:25   Dg Chest 2 View  Result Date: 02/18/2017 CLINICAL DATA:  Hyperglycemia.  Chest pain and back pain. EXAM: CHEST  2 VIEW COMPARISON:  December 20, 2016 FINDINGS: The heart size and mediastinal contours are within normal limits. Both lungs are clear. The visualized skeletal structures are unremarkable. IMPRESSION: No active cardiopulmonary disease. Electronically Signed   By: Dorise Bullion III M.D   On: 02/18/2017 14:52   Ct Abdomen Pelvis W Contrast  Result Date: 02/18/2017 CLINICAL DATA:  52 y/o F; chest pain, back pain, right kidney pain, nausea, vomiting, and epigastric pain radiating to the back. EXAM: CT ABDOMEN AND PELVIS WITH CONTRAST TECHNIQUE: Multidetector CT imaging of the abdomen and pelvis was performed using the standard protocol following bolus administration of intravenous contrast. CONTRAST:  162m ISOVUE-300 IOPAMIDOL (ISOVUE-300) INJECTION 61% COMPARISON:  10/02/2016 CT of abdomen and pelvis. FINDINGS: Lower chest: No acute abnormality. Hepatobiliary: Cholecystectomy. Cirrhotic  liver. Stable hepatic cysts. Interval resolution of pneumobilia. Common bile duct measures up to 10 mm, likely compensatory post cholecystectomy. No intrahepatic biliary ductal dilatation. Pancreas: Unremarkable. No pancreatic ductal dilatation or surrounding inflammatory changes. Spleen: Spleen measures 12.6 x 6.5 x 19.2 Cm (volume = 820 cm^3)(series 5, image 57 and series 2, image 27), previously 12.9 x 6.9 x 20.1 cm (volume = 940 cm^3) measured in similar fashion. Adrenals/Urinary Tract: Adrenal glands are unremarkable. Kidneys are normal, without renal calculi, focal lesion, or hydronephrosis. Air within the bladder, question recent instrumentation versus  cystitis. Stomach/Bowel: Stomach is within normal limits. Appendix appears normal. No evidence of bowel wall thickening, distention, or inflammatory changes. Scattered sigmoid diverticulosis. Vascular/Lymphatic: Aortic atherosclerosis. No enlarged abdominal or pelvic lymph nodes. Reproductive: Status post hysterectomy. No adnexal masses. Other: Stable chronic postsurgical changes within the left lower anterior abdominal wall. Musculoskeletal: No fracture is seen. Mild lumbar levocurvature with apex at L3. IMPRESSION: 1. Cirrhotic liver. Mild decrease in splenomegaly 820 cc, previously 940 cc. 2. Air within bladder, question recent instrumentation versus cystitis. 3. Scattered sigmoid diverticulosis, no findings of acute diverticulitis. Electronically Signed   By: Kristine Garbe M.D.   On: 02/18/2017 19:44

## 2017-02-26 NOTE — ED Provider Notes (Signed)
[facility] Emergency Department Provider Note  ____________________________________________   First MD Initiated Contact with Patient 02/26/17 1817     (approximate)  I have reviewed the triage vital signs and the nursing notes.   HISTORY  Chief Complaint Anxiety    HPI Lori Mckenzie is a 52 y.o. female who self presents the emergency department with a panic attack.  She has had generalized anxiety for the past week or so after her sons informed her that they were going to find her a nursing home and to contact the Department of Motor Vehicles to have her driver's license revoked.  When the patient thinks about these events she begins to hyperventilate and feel a "overwhelming sense of doom".  Her symptoms are intermittent.  They are severe.  They are worsened by interpersonal conflict and somewhat improved with rest.  They are associated with sharp upper chest pain that radiates to her back.  She does take alprazolam which does seem to help her symptoms.  Past Medical History:  Diagnosis Date  . Abdominal abscess 08/25/2014  . Acid reflux 08/10/2010  . Acute cervical myofascial strain 02/09/2016  . Acute postoperative pain 08/09/2016  . Anxiety   . Ascites   . Asthma   . Back pain   . Bile leak, postoperative 07/17/2014  . Brittle bone disease   . Cancer ([place])    Uteriine  ca 74yr ago partial hysterectomy  . Cervical disc disease   . Chronic kidney disease   . Collagen vascular disease ([place]    RA  3-4 yrs ago  . COPD (chronic obstructive pulmonary disease) ([place]   . Diabetes mellitus without complication ([place]   . GERD (gastroesophageal reflux disease)   . Hypertension   . Hypothyroidism   . Left upper quadrant pain 01/09/2014  . Major depressive disorder with single episode 12/05/2011  . Major depressive disorder, single episode 12/05/2011  . Migraines   . NASH (nonalcoholic steatohepatitis)   . Respiratory infection    2/17  . Shock ([place] 09/18/2014    . Sleep apnea   . Sleep apnea   . Syncope 11/16/2014  . Thyroid disease   . TIA (transient ischemic attack)     Patient Active Problem List   Diagnosis Date Noted  . Neurogenic pain 02/13/2017  . Chest pain 12/20/2016  . Chronic feet pain (Secondary source of pain) (Bilateral) (R>L) 10/26/2016  . Acute hepatic encephalopathy 09/23/2016  . Low back pain due to L1-2 disc extrusion (caudal) (Left) 07/11/2016  . Abdominal pain 06/28/2016  . Constipation 06/20/2016  . Hyperglycemia 06/20/2016  . Nausea without vomiting 06/06/2016  . Chronic lower extremity pain (Bilateral) 05/19/2016  . Spinal stenosis, thoracic region (T10-11) 05/19/2016  . Diabetes mellitus, insulin dependent (IDDM), uncontrolled ([place] 04/12/2016  . Chronic sacroiliac joint pain (Bilateral) (L>R) 03/23/2016  . Chronic upper extremity pain (Left) 03/10/2016  . Chronic radicular cervical pain (L) 03/10/2016  . Osteoarthritis 02/24/2016  . Allodynia 02/09/2016  . Chronic pain syndrome 01/07/2016  . Opiate withdrawal ([place] 12/22/2015  . Elevated liver enzymes 09/28/2015  . Cirrhosis of liver without ascites ([place] 09/24/2015  . Cirrhosis ([place] 09/24/2015  . Altered mental status 07/24/2015  . Uncontrolled diabetes mellitus ([place] 07/24/2015  . Radicular pain of thoracic region 04/06/2015  . Hepatic encephalopathy ([place] 03/11/2015  . Elevated sedimentation rate 03/05/2015  . Elevated C-reactive protein (CRP) 03/05/2015  . Lumbar facet syndrome (Bilateral) (R>L) 03/05/2015  . Cervical spondylosis 03/05/2015  . Lumbar spondylosis 03/05/2015  .  Encounter for chronic pain management 03/05/2015  . Chronic shoulder pain (Bilateral) (R>L) 03/05/2015  . Chronic carpal tunnel syndrome (Bilateral) 03/05/2015  . Chronic hip pain (Bilateral) (L>R) 03/05/2015  . Chronic upper back pain (Bilateral) (L>R) 03/05/2015  . Osteoporosis, idiopathic 03/05/2015  . Abnormal MRI, lumbar spine (02/03/2015) 03/05/2015  . Thoracic  radiculitis 02/05/2015  . Vitamin D deficiency 01/13/2015  . B12 deficiency 01/13/2015  . Folate deficiency 01/13/2015  . Subacute lumbar radiculopathy (left side) (S1 dermatome) 12/10/2014  . Drowsiness 11/16/2014  . Episode of syncope 11/16/2014  . Somnolence 11/16/2014  . Opiate use (75 MME/Day) 10/28/2014  . Long term prescription opiate use 10/28/2014  . Long term current use of opiate analgesic 10/28/2014  . Encounter for therapeutic drug level monitoring 10/28/2014  . Chronic epigastric abdominal pain 10/28/2014  . Chronic low back pain (Primary Source of Pain) (Bilateral) (R>L) 10/28/2014  . Chronic neck pain [facility] source of pain) (Bilateral) (R>L) 10/28/2014  . Ascites 09/05/2014  . NASH (nonalcoholic steatohepatitis) 09/05/2014  . Hypokalemia 09/05/2014  . Dysthymia 08/05/2014  . Other social stressor 08/05/2014  . Steatohepatitis 07/15/2014  . Type 2 diabetes mellitus ([place]) 07/12/2014  . COPD with acute exacerbation ([place]) 07/12/2014  . GERD (gastroesophageal reflux disease) 07/12/2014  . OSA on CPAP 07/12/2014  . Anxiety 07/12/2014  . 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 Lumbar and sacral osteoarthritis 03/26/2014  . Myofascial pain 03/26/2014  . Lumbar spinal stenosis 03/26/2014  . Lumbosacral spondylosis without myelopathy 03/26/2014  . 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 Lumbar central spinal stenosis (L1-2 and L4-5) 03/26/2014  . Spondylosis of lumbar region without myelopathy or radiculopathy 03/26/2014  . Breath shortness 01/09/2014  . Diastolic dysfunction with chronic heart failure ([place]) 08/06/2013  . Airway hyperreactivity 08/06/2013  . Essential (primary) hypertension 08/06/2013  . Asthma 08/06/2013  . Clinical depression 12/05/2011    Past Surgical History:  Procedure Laterality Date  . ABDOMINAL HYSTERECTOMY    . CHOLECYSTECTOMY N/A 07/15/2014   Procedure: LAPAROSCOPIC CHOLECYSTECTOMY with liver biopsy ;  Surgeon: Sherri Rad, MD;  Location: [facility] ORS;  Service: General;  Laterality: N/A;  . COLONOSCOPY WITH PROPOFOL N/A 06/23/2014   Procedure: COLONOSCOPY WITH PROPOFOL;  Surgeon: Lollie Sails, MD;  Location: [facility] ENDOSCOPY;  Service: Endoscopy;  Laterality: N/A;  . ERCP N/A 07/16/2014   Procedure: ENDOSCOPIC RETROGRADE CHOLANGIOPANCREATOGRAPHY (ERCP);  Surgeon: Clarene Essex, MD;  Location: [facility] ENDOSCOPY;  Service: Endoscopy;  Laterality: N/A;  . ERCP N/A 10/03/2014   Procedure: ENDOSCOPIC RETROGRADE CHOLANGIOPANCREATOGRAPHY (ERCP);  Surgeon: Hulen Luster, MD;  [facility]COPY;  Service: Gastroenterology;  Laterality: N/A;  . ESOPHAGOGASTRODUODENOSCOPY N/A 06/23/2014   Procedure: ESOPHAGOGASTRODUODENOSCOPY (EGD);  Surgeon: Lollie Sails, MD;  Location: [facility] ENDOSCOPY;  Service: Endoscopy;  Laterality: N/A;  . ESOPHAGOGASTRODUODENOSCOPY N/A 06/30/2016   Procedure: ESOPHAGOGASTRODUODENOSCOPY (EGD);  Surgeon: Jonathon Bellows, MD;  Location: [facility] ENDOSCOPY;  Service: Endoscopy;  Laterality: N/A;  . ESOPHAGOGASTRODUODENOSCOPY (EGD) WITH PROPOFOL N/A 12/01/2015   Procedure: ESOPHAGOGASTRODUODENOSCOPY (EGD) WITH PROPOFOL;  Surgeon: Lollie Sails, MD;  Location: [facility] ENDOSCOPY;  Service: Endoscopy;  Laterality: N/A;  . TUBAL LIGATION    . WISDOM TOOTH EXTRACTION      Prior to Admission medications   Medication Sig Start Date End Date Taking? Authorizing Provider  albuterol (PROVENTIL HFA;VENTOLIN HFA) 108 (90 Base) MCG/ACT inhaler Inhale 2 puffs into the lungs every 6 (six) hours as needed for wheezing or shortness of breath.    [provider]  ALPRAZolam (  XANAX) 0.5 MG tablet Take 1 tablet (0.5 mg total) by mouth 3 (three) times daily as needed for sleep or anxiety. 12/21/16 12/21/17  Dustin Flock, MD  cholecalciferol (VITAMIN D) 1000 units tablet Take 2,000 Units by mouth daily.    [provider]  citalopram (CELEXA) 40 MG tablet Take by mouth daily.  07/24/15   [provider]    diclofenac sodium (VOLTAREN) 1 % GEL Apply 4 g topically every 8 (eight) hours as needed. 12/06/16 06/04/17  Milinda Pointer, MD  fluticasone (FLONASE) 50 MCG/ACT nasal spray Place 1-2 sprays into both nostrils daily.     [provider]  Folate-B12-Intrinsic Factor (INTRINSI B12-FOLATE) 202-542-70 MCG-MCG-MG TABS Take 1 tablet by mouth daily. Reported on 04/06/2015    [provider]  furosemide (LASIX) 40 MG tablet Take 1 tablet (40 mg total) by mouth 2 (two) times daily. Patient taking differently: Take 40 mg by mouth daily.  10/26/16   Nicholes Mango, MD  gabapentin (NEURONTIN) 300 MG capsule Take 600 mg by mouth 4 (four) times daily.     [provider]  insulin detemir (LEVEMIR) 100 UNIT/ML injection Inject 0.25 mLs (25 Units total) into the skin 2 (two) times daily. Patient taking differently: Inject 50 Units into the skin 2 (two) times daily.  10/26/16   Gouru, Illene Silver, MD  ipratropium-albuterol (DUONEB) 0.5-2.5 (3) MG/3ML SOLN Take 3 mLs by nebulization every 4 (four) hours as needed. For shortness of breath/wheezing    [provider]  lactulose (CHRONULAC) 10 GM/15ML solution Take 30 mLs (20 g total) by mouth 3 (three) times daily. 10/26/16   Gouru, Illene Silver, MD  lidocaine (LIDODERM) 5 % Place 1 patch onto the skin daily. Remove & Discard patch within 12 hours or as directed by MD 12/16/16   Gregor Hams, MD  nystatin (MYCOSTATIN/NYSTOP) powder Apply 1 g topically 3 (three) times daily as needed. For irritation 10/26/16   Gouru, Illene Silver, MD  Oxycodone HCl 10 MG TABS Take 1 tablet (10 mg total) by mouth every 8 (eight) hours as needed for up to 9 days. 02/26/17 03/07/17  Darel Hong, MD  pantoprazole (PROTONIX) 40 MG tablet Take 40 mg by mouth 2 (two) times daily before a meal.    [provider]  potassium chloride SA (K-DUR,KLOR-CON) 20 MEQ tablet Take 10-20 mEq by mouth daily as needed. For high blood pressure    [provider]   promethazine (PHENERGAN) 12.5 MG tablet Take 2 tablets (25 mg total) by mouth every 6 (six) hours as needed for nausea or vomiting. 09/25/16   Dustin Flock, MD  rifaximin (XIFAXAN) 550 MG TABS tablet Take 550 mg by mouth 2 (two) times daily.     [provider]  rizatriptan (MAXALT) 10 MG tablet Take 10 mg by mouth as needed for migraine.     [provider]  spironolactone (ALDACTONE) 100 MG tablet Take 100 mg by mouth daily.     [provider]  vitamin B-12 (CYANOCOBALAMIN) 1000 MCG tablet Take 2,000 mcg by mouth daily.    [provider]  SUMAtriptan (IMITREX) 50 MG tablet Take 50 mg by mouth every 2 (two) hours as needed. For migraines  03/17/11  [provider]    Allergies Tape and Vicodin [hydrocodone-acetaminophen]  Family History  Problem Relation Age of Onset  . Lung cancer Mother   . Ulcers Father   . Emphysema Father   . Heart disease Sister   . Ulcers Sister   .  Heart disease Brother     Social History Social History   Tobacco Use  . Smoking status: Never Smoker  . Smokeless tobacco: Never Used  Substance Use Topics  . Alcohol use: No    Comment: occ  . Drug use: No    Review of Systems Constitutional: No fever/chills Eyes: No visual changes. ENT: No sore throat. Cardiovascular: Positive for chest pain. Respiratory: Positive for shortness of breath. Gastrointestinal: No abdominal pain.  Positive for nausea, no vomiting.  No diarrhea.  No constipation. Genitourinary: Negative for dysuria. Musculoskeletal: Negative for back pain. Skin: Negative for rash. Neurological: Negative for headaches, focal weakness or numbness.   ____________________________________________   PHYSICAL EXAM:  VITAL SIGNS: ED Triage Vitals  Enc Vitals Group     BP 02/26/17 1700 (!) 153/100     Pulse Rate 02/26/17 1700 (!) 122     Resp 02/26/17 1700 (!) 28     Temp 02/26/17 1700 99.8 F (37.7 C)     Temp Source 02/26/17 1700  Oral     SpO2 02/26/17 1700 96 %     Weight 02/26/17 1701 260 lb (117.9 kg)     Height 02/26/17 1701 5' 2"  (1.575 m)     Head Circumference --      Peak Flow --      Pain Score 02/26/17 1701 9     Pain Loc --      Pain Edu? --      Excl. in [place]? --     Constitutional: Alert and oriented x4 tearful and anxious appearing Eyes: PERRL EOMI. Head: Atraumatic. Nose: No congestion/rhinnorhea. Mouth/Throat: No trismus Neck: No stridor.   Cardiovascular: Tachycardic rate, regular rhythm. Grossly normal heart sounds.  Good peripheral circulation. Respiratory: Increased respiratory effort.  No retractions. Lungs CTAB and moving good air Gastrointestinal: Obese soft nontender Musculoskeletal: No lower extremity edema   Neurologic:  . No gross focal neurologic deficits are appreciated. Skin:  Skin is warm, dry and intact. No rash noted. Psychiatric tearful and anxious appearing.    ____________________________________________   DIFFERENTIAL includes but not limited to  Panic attack, generalized anxiety disorder, acute coronary syndrome, pulmonary embolism, aortic dissection ____________________________________________   LABS (all labs ordered are listed, but only abnormal results are displayed)  Labs Reviewed  BASIC METABOLIC PANEL - Abnormal; Notable for the following components:      Result Value   Glucose, Bld 350 (*)    All other components within normal limits  CBC - Abnormal; Notable for the following components:   MCV 101.0 (*)    MCH 34.9 (*)    Platelets 75 (*)    All other components within normal limits  TROPONIN I    Lab work reviewed by me shows elevated MCV but otherwise unremarkable __________________________________________  EKG  ED ECG REPORT I, Darel Hong, the attending physician, personally viewed and interpreted this ECG.  Date: 02/27/2017 EKG Time:  Rate: 115 Rhythm: normal sinus rhythm QRS Axis: Leftward axis Intervals: normal ST/T Wave  abnormalities: normal Narrative Interpretation: no evidence of acute ischemia  ____________________________________________  RADIOLOGY  Chest x-ray reviewed by me with no acute disease ____________________________________________   PROCEDURES  Procedure(s) performed: no  Procedures  Critical Care performed: no  Observation: no ____________________________________________   INITIAL IMPRESSION / ASSESSMENT AND PLAN / ED COURSE  Pertinent labs & imaging results that were available during my care of the patient were reviewed by me and considered in my medical decision making (see chart for details).  The patient arrives tearful although consolable.  We had a lengthy discussion regarding her psychosocial stressors and I gave her 2 mg of oral Lorazepam which improved her symptoms.  Her friend is at bedside to drive her home.  I had a lengthy discussion with the patient regarding her symptoms and that fortunately medically speaking she appears relatively well today.  She will follow-up with her primary care physician to discuss further treatment as an outpatient.  Discharged home in improved condition and she verbalizes understanding and agreement with the plan.      ____________________________________________   FINAL CLINICAL IMPRESSION(S) / ED DIAGNOSES  Final diagnoses:  Panic attack      NEW MEDICATIONS STARTED DURING THIS VISIT:  Discharge Medication List as of 02/26/2017  7:17 PM       Note:  This document was prepared using Dragon voice recognition software and may include unintentional dictation errors.     Darel Hong, MD 02/27/17 2051

## 2017-03-06 ENCOUNTER — Emergency Department
Admission: EM | Admit: 2017-03-06 | Discharge: 2017-03-06 | Disposition: A | Discharge status: Home / Self Care | Payer: Self-pay | Attending: Emergency Medicine | Admitting: Emergency Medicine

## 2017-03-06 ENCOUNTER — Other Ambulatory Visit: Payer: Self-pay

## 2017-03-06 ENCOUNTER — Encounter: Payer: Self-pay | Admitting: Emergency Medicine

## 2017-03-06 DIAGNOSIS — R739 Hyperglycemia, unspecified: Secondary | ICD-10-CM | POA: Insufficient documentation

## 2017-03-06 DIAGNOSIS — Z5321 Procedure and treatment not carried out due to patient leaving prior to being seen by health care provider: Secondary | ICD-10-CM | POA: Insufficient documentation

## 2017-03-06 DIAGNOSIS — R1084 Generalized abdominal pain: Secondary | ICD-10-CM | POA: Insufficient documentation

## 2017-03-06 LAB — URINALYSIS, COMPLETE (UACMP) WITH MICROSCOPIC
Bacteria, UA: NONE SEEN
Bilirubin Urine: NEGATIVE
Glucose, UA: >=500 mg/dL — AB
Hgb urine dipstick: NEGATIVE
Ketones, ur: NEGATIVE mg/dL
Nitrite: NEGATIVE
Protein, ur: NEGATIVE mg/dL
Specific Gravity, Urine: 1.033 — ABNORMAL HIGH (ref 1.005–1.030)
pH: 6 (ref 5.0–8.0)

## 2017-03-06 LAB — GLUCOSE, CAPILLARY: Glucose-Capillary: 425 mg/dL — ABNORMAL HIGH (ref 65–99)

## 2017-03-06 LAB — COMPREHENSIVE METABOLIC PANEL
ALT: 51 U/L (ref 14–54)
AST: 91 U/L — ABNORMAL HIGH (ref 15–41)
Albumin: 4.3 g/dL (ref 3.5–5.0)
Alkaline Phosphatase: 124 U/L (ref 38–126)
Anion gap: 6 (ref 5–15)
BUN: 10 mg/dL (ref 6–20)
CO2: 30 mmol/L (ref 22–32)
Calcium: 9 mg/dL (ref 8.9–10.3)
Chloride: 96 mmol/L — ABNORMAL LOW (ref 101–111)
Creatinine, Ser: 0.58 mg/dL (ref 0.44–1.00)
GFR calc Af Amer: >60 mL/min (ref >60)
GFR calc non Af Amer: >60 mL/min (ref >60)
Glucose, Bld: 451 mg/dL — ABNORMAL HIGH (ref 65–99)
Potassium: 3.4 mmol/L — ABNORMAL LOW (ref 3.5–5.1)
Sodium: 132 mmol/L — ABNORMAL LOW (ref 135–145)
Total Bilirubin: 2.1 mg/dL — ABNORMAL HIGH (ref 0.3–1.2)
Total Protein: 7.4 g/dL (ref 6.5–8.1)

## 2017-03-06 LAB — CBC
HCT: 43.8 % (ref 35.0–47.0)
Hemoglobin: 14.8 g/dL (ref 12.0–16.0)
MCH: 34.3 pg — ABNORMAL HIGH (ref 26.0–34.0)
MCHC: 33.7 g/dL (ref 32.0–36.0)
MCV: 101.9 fL — ABNORMAL HIGH (ref 80.0–100.0)
Platelets: 81 10*3/uL — ABNORMAL LOW (ref 150–440)
RBC: 4.3 MIL/uL (ref 3.80–5.20)
RDW: 14.2 % (ref 11.5–14.5)
WBC: 3.5 10*3/uL — ABNORMAL LOW (ref 3.6–11.0)

## 2017-03-06 LAB — LIPASE, BLOOD: Lipase: 36 U/L (ref 11–51)

## 2017-03-06 NOTE — ED Triage Notes (Signed)
ARrives with c/o "feeling funny" and side pain.  Patient states she feels she may have pulled something today when lifting husband up today to bring him to be seen by PCP.  Patient states she started to feel "funny" about one hour ago.   CBG:  425.

## 2017-03-07 ENCOUNTER — Telehealth: Payer: Self-pay | Admitting: Emergency Medicine

## 2017-03-07 NOTE — Telephone Encounter (Signed)
Called patient due to lwot to inquire about condition and follow up plans. Pt says she will call her doctor and have them review her labs.

## 2017-03-13 ENCOUNTER — Encounter (HOSPITAL_COMMUNITY): Payer: Self-pay | Admitting: Emergency Medicine

## 2017-03-13 ENCOUNTER — Emergency Department (HOSPITAL_COMMUNITY)
Admission: EM | Admit: 2017-03-13 | Discharge: 2017-03-14 | Disposition: A | Discharge status: Home / Self Care | Payer: Self-pay | Attending: Emergency Medicine | Admitting: Emergency Medicine

## 2017-03-13 ENCOUNTER — Emergency Department (HOSPITAL_COMMUNITY): Payer: Self-pay

## 2017-03-13 ENCOUNTER — Other Ambulatory Visit: Payer: Self-pay

## 2017-03-13 DIAGNOSIS — J449 Chronic obstructive pulmonary disease, unspecified: Secondary | ICD-10-CM | POA: Insufficient documentation

## 2017-03-13 DIAGNOSIS — Z8542 Personal history of malignant neoplasm of other parts of uterus: Secondary | ICD-10-CM | POA: Insufficient documentation

## 2017-03-13 DIAGNOSIS — I129 Hypertensive chronic kidney disease with stage 1 through stage 4 chronic kidney disease, or unspecified chronic kidney disease: Secondary | ICD-10-CM | POA: Insufficient documentation

## 2017-03-13 DIAGNOSIS — Z794 Long term (current) use of insulin: Secondary | ICD-10-CM | POA: Insufficient documentation

## 2017-03-13 DIAGNOSIS — R1010 Upper abdominal pain, unspecified: Secondary | ICD-10-CM

## 2017-03-13 DIAGNOSIS — K5900 Constipation, unspecified: Secondary | ICD-10-CM | POA: Insufficient documentation

## 2017-03-13 DIAGNOSIS — E039 Hypothyroidism, unspecified: Secondary | ICD-10-CM | POA: Insufficient documentation

## 2017-03-13 DIAGNOSIS — Z79899 Other long term (current) drug therapy: Secondary | ICD-10-CM | POA: Insufficient documentation

## 2017-03-13 DIAGNOSIS — E1122 Type 2 diabetes mellitus with diabetic chronic kidney disease: Secondary | ICD-10-CM | POA: Insufficient documentation

## 2017-03-13 DIAGNOSIS — N189 Chronic kidney disease, unspecified: Secondary | ICD-10-CM | POA: Insufficient documentation

## 2017-03-13 LAB — CBC
HCT: 43.5 % (ref 36.0–46.0)
Hemoglobin: 14.6 g/dL (ref 12.0–15.0)
MCH: 34 pg (ref 26.0–34.0)
MCHC: 33.6 g/dL (ref 30.0–36.0)
MCV: 101.2 fL — ABNORMAL HIGH (ref 78.0–100.0)
Platelets: 67 10*3/uL — ABNORMAL LOW (ref 150–400)
RBC: 4.3 MIL/uL (ref 3.87–5.11)
RDW: 13.6 % (ref 11.5–15.5)
WBC: 4.3 10*3/uL (ref 4.0–10.5)

## 2017-03-13 LAB — URINALYSIS, ROUTINE W REFLEX MICROSCOPIC
Bilirubin Urine: NEGATIVE
Glucose, UA: >=500 mg/dL — AB
Hgb urine dipstick: NEGATIVE
Ketones, ur: NEGATIVE mg/dL
Leukocytes, UA: NEGATIVE
Nitrite: NEGATIVE
Protein, ur: NEGATIVE mg/dL
Specific Gravity, Urine: 1.027 (ref 1.005–1.030)
pH: 5 (ref 5.0–8.0)

## 2017-03-13 LAB — CBG MONITORING, ED: Glucose-Capillary: 402 mg/dL — ABNORMAL HIGH (ref 65–99)

## 2017-03-13 LAB — COMPREHENSIVE METABOLIC PANEL
ALT: 57 U/L — ABNORMAL HIGH (ref 14–54)
AST: 88 U/L — ABNORMAL HIGH (ref 15–41)
Albumin: 3.8 g/dL (ref 3.5–5.0)
Alkaline Phosphatase: 115 U/L (ref 38–126)
Anion gap: 11 (ref 5–15)
BUN: 7 mg/dL (ref 6–20)
CO2: 26 mmol/L (ref 22–32)
Calcium: 9 mg/dL (ref 8.9–10.3)
Chloride: 99 mmol/L — ABNORMAL LOW (ref 101–111)
Creatinine, Ser: 0.73 mg/dL (ref 0.44–1.00)
GFR calc Af Amer: >60 mL/min (ref >60)
GFR calc non Af Amer: >60 mL/min (ref >60)
Glucose, Bld: 345 mg/dL — ABNORMAL HIGH (ref 65–99)
Potassium: 3.7 mmol/L (ref 3.5–5.1)
Sodium: 136 mmol/L (ref 135–145)
Total Bilirubin: 1.5 mg/dL — ABNORMAL HIGH (ref 0.3–1.2)
Total Protein: 7.1 g/dL (ref 6.5–8.1)

## 2017-03-13 LAB — LIPASE, BLOOD: Lipase: 31 U/L (ref 11–51)

## 2017-03-13 MED ORDER — OXYCODONE HCL 5 MG PO TABS: 5.0000 mg | ORAL_TABLET | Freq: Four times a day (QID) | ORAL | 0 refills | Status: DC | PRN

## 2017-03-13 MED ORDER — SODIUM CHLORIDE 0.9 % IV BOLUS (SEPSIS)
1000.0000 mL | Freq: Once | INTRAVENOUS | Status: AC
  Administered 2017-03-13: 1000 mL via INTRAVENOUS

## 2017-03-13 MED ORDER — ONDANSETRON HCL 4 MG/2ML IJ SOLN
4.0000 mg | Freq: Once | INTRAMUSCULAR | Status: AC
  Administered 2017-03-13: 4 mg via INTRAVENOUS
  Filled 2017-03-13: qty 2

## 2017-03-13 MED ORDER — FENTANYL CITRATE (PF) 100 MCG/2ML IJ SOLN
50.0000 ug | Freq: Once | INTRAMUSCULAR | Status: AC
  Administered 2017-03-13: 50 ug via INTRAVENOUS
  Filled 2017-03-13: qty 2

## 2017-03-13 NOTE — ED Notes (Signed)
Pt ambulatory to restroom, tolerated well,

## 2017-03-13 NOTE — ED Notes (Signed)
Patient transported to X-ray 

## 2017-03-13 NOTE — ED Triage Notes (Signed)
Pt c/o abd pain that radiates all across her abd. Pt states this is a chronic problem with previous abd surgery. Pt states her blood sugar at home was 317.

## 2017-03-13 NOTE — ED Provider Notes (Signed)
[facility] EMERGENCY DEPARTMENT Provider Note   CSN: [id number] Arrival date & time: 03/13/17  1921     History   Chief Complaint Chief Complaint  Patient presents with  . Abdominal Pain    HPI Lori Mckenzie is a 52 y.o. female.  Patient presents today with upper abdominal discomfort that radiates towards the right flank. Patient endorses nausea without emesis. Scant urine and stool production. History of chronic constipation. No dysuria. Patient also reports elevated blood sugars. PCP is titrating her maintenance medications for blood sugar control.   The history is provided by the patient and medical records. No language interpreter was used.  Abdominal Pain   This is a recurrent problem. The current episode started yesterday. The problem occurs every several days. The problem has been gradually worsening. The pain is located in the LUQ and RUQ. The pain is moderate. Associated symptoms include anorexia and nausea. Pertinent negatives include fever and vomiting. Past workup includes CT scan. Her past medical history is significant for GERD.    Past Medical History:  Diagnosis Date  . Abdominal abscess 08/25/2014  . Acid reflux 08/10/2010  . Acute cervical myofascial strain 02/09/2016  . Acute postoperative pain 08/09/2016  . Anxiety   . Ascites   . Asthma   . Back pain   . Bile leak, postoperative 07/17/2014  . Brittle bone disease   . Cancer ([place])    Uteriine  ca 93yr ago partial hysterectomy  . Cervical disc disease   . Chronic kidney disease   . Collagen vascular disease ([place]    RA  3-4 yrs ago  . COPD (chronic obstructive pulmonary disease) ([place]   . Diabetes mellitus without complication ([place]   . GERD (gastroesophageal reflux disease)   . Hypertension   . Hypothyroidism   . Left upper quadrant pain 01/09/2014  . Major depressive disorder with single episode 12/05/2011  . Major depressive disorder, single episode 12/05/2011  . Migraines   . NASH (nonalcoholic  steatohepatitis)   . Respiratory infection    2/17  . Shock ([place] 09/18/2014  . Sleep apnea   . Sleep apnea   . Syncope 11/16/2014  . Thyroid disease   . TIA (transient ischemic attack)     Patient Active Problem List   Diagnosis Date Noted  . Neurogenic pain 02/13/2017  . Chest pain 12/20/2016  . Chronic feet pain (Secondary source of pain) (Bilateral) (R>L) 10/26/2016  . Acute hepatic encephalopathy 09/23/2016  . Low back pain due to L1-2 disc extrusion (caudal) (Left) 07/11/2016  . Abdominal pain 06/28/2016  . Constipation 06/20/2016  . Hyperglycemia 06/20/2016  . Nausea without vomiting 06/06/2016  . Chronic lower extremity pain (Bilateral) 05/19/2016  . Spinal stenosis, thoracic region (T10-11) 05/19/2016  . Diabetes mellitus, insulin dependent (IDDM), uncontrolled ([place] 04/12/2016  . Chronic sacroiliac joint pain (Bilateral) (L>R) 03/23/2016  . Chronic upper extremity pain (Left) 03/10/2016  . Chronic radicular cervical pain (L) 03/10/2016  . Osteoarthritis 02/24/2016  . Allodynia 02/09/2016  . Chronic pain syndrome 01/07/2016  . Opiate withdrawal ([place] 12/22/2015  . Elevated liver enzymes 09/28/2015  . Cirrhosis of liver without ascites ([place] 09/24/2015  . Cirrhosis ([place] 09/24/2015  . Altered mental status 07/24/2015  . Uncontrolled diabetes mellitus ([place] 07/24/2015  . Radicular pain of thoracic region 04/06/2015  . Hepatic encephalopathy ([place] 03/11/2015  . Elevated sedimentation rate 03/05/2015  . Elevated C-reactive protein (CRP) 03/05/2015  . Lumbar facet syndrome (Bilateral) (R>L) 03/05/2015  . Cervical spondylosis 03/05/2015  .  Lumbar spondylosis 03/05/2015  . Encounter for chronic pain management 03/05/2015  . Chronic shoulder pain (Bilateral) (R>L) 03/05/2015  . Chronic carpal tunnel syndrome (Bilateral) 03/05/2015  . Chronic hip pain (Bilateral) (L>R) 03/05/2015  . Chronic upper back pain (Bilateral) (L>R) 03/05/2015  . Osteoporosis, idiopathic  03/05/2015  . Abnormal MRI, lumbar spine (02/03/2015) 03/05/2015  . Thoracic radiculitis 02/05/2015  . Vitamin D deficiency 01/13/2015  . B12 deficiency 01/13/2015  . Folate deficiency 01/13/2015  . Subacute lumbar radiculopathy (left side) (S1 dermatome) 12/10/2014  . Drowsiness 11/16/2014  . Episode of syncope 11/16/2014  . Somnolence 11/16/2014  . Opiate use (75 MME/Day) 10/28/2014  . Long term prescription opiate use 10/28/2014  . Long term current use of opiate analgesic 10/28/2014  . Encounter for therapeutic drug level monitoring 10/28/2014  . Chronic epigastric abdominal pain 10/28/2014  . Chronic low back pain (Primary Source of Pain) (Bilateral) (R>L) 10/28/2014  . Chronic neck pain [facility] source of pain) (Bilateral) (R>L) 10/28/2014  . Ascites 09/05/2014  . NASH (nonalcoholic steatohepatitis) 09/05/2014  . Hypokalemia 09/05/2014  . Dysthymia 08/05/2014  . Other social stressor 08/05/2014  . Steatohepatitis 07/15/2014  . Type 2 diabetes mellitus ([place]) 07/12/2014  . COPD with acute exacerbation ([place]) 07/12/2014  . GERD (gastroesophageal reflux disease) 07/12/2014  . OSA on CPAP 07/12/2014  . Anxiety 07/12/2014  . 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 Lumbar and sacral osteoarthritis 03/26/2014  . Myofascial pain 03/26/2014  . Lumbar spinal stenosis 03/26/2014  . Lumbosacral spondylosis without myelopathy 03/26/2014  . 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 Lumbar central spinal stenosis (L1-2 and L4-5) 03/26/2014  . Spondylosis of lumbar region without myelopathy or radiculopathy 03/26/2014  . Breath shortness 01/09/2014  . Diastolic dysfunction with chronic heart failure ([place]) 08/06/2013  . Airway hyperreactivity 08/06/2013  . Essential (primary) hypertension 08/06/2013  . Asthma 08/06/2013  . Clinical depression 12/05/2011    Past Surgical History:  Procedure Laterality Date  . ABDOMINAL HYSTERECTOMY    . CHOLECYSTECTOMY N/A 07/15/2014    Procedure: LAPAROSCOPIC CHOLECYSTECTOMY with liver biopsy ;  Surgeon: Sherri Rad, MD;  Location: [facility] ORS;  Service: General;  Laterality: N/A;  . COLONOSCOPY WITH PROPOFOL N/A 06/23/2014   Procedure: COLONOSCOPY WITH PROPOFOL;  Surgeon: Lollie Sails, MD;  [facility] END[facility]COPY;  Service: Endoscopy;  Laterality: N/A;  . ERCP N/A 07/16/2014   Procedure: ENDOSCOPIC RETROGRADE CHOLANGIOPANCREATOGRAPHY (ERCP);  Surgeon: Clarene Essex, MD;  Location: [facility] END[facility];  Service: Endoscopy;  Laterality: N/A;  . ERCP N/A 10/03/2014   Procedure: ENDOSCOPIC RETROGRADE CHOLANGIOPANCREATOGRAPHY (ERCP);  Surgeon: Hulen Luster, MD;  [facility] ENDOSCOPY;  Service: Gastroenterology;  Laterality: N/A;  . ESOPHAGOGASTRODUODENOSCOPY N/A 06/23/2014   Procedure: ESOPHAGOGASTRODUODENOSCOPY (EGD);  Surgeon: Lollie Sails, MD;  Location: [facility] ENDOSCOPY;  Service: Endoscopy;  Laterality: N/A;  . ESOPHAGOGASTRODUODENOSCOPY N/A 06/30/2016   Procedure: ESOPHAGOGASTRODUODENOSCOPY (EGD);  Surgeon: Jonathon Bellows, MD;  Location: [facility] ENDOSCOPY;  Service: Endoscopy;  Laterality: N/A;  . ESOPHAGOGASTRODUODENOSCOPY (EGD) WITH PROPOFOL N/A 12/01/2015   Procedure: ESOPHAGOGASTRODUODENOSCOPY (EGD) WITH PROPOFOL;  Surgeon: Lollie Sails, MD;  Location[facility] ENDOSCOPY;  Service: Endoscopy;  Laterality: N/A;  . TUBAL LIGATION    . WISDOM TOOTH EXTRACTION      OB History    No data available       Home Medications    Prior to Admission medications   Medication Sig Start Date End Date Taking? Authorizing Provider  albuterol (PROVENTIL HFA;VENTOLIN HFA) 108 (90 Base) MCG/ACT inhaler Inhale 2  puffs into the lungs every 6 (six) hours as needed for wheezing or shortness of breath.    [provider]  ALPRAZolam Duanne Moron) 0.5 MG tablet Take 1 tablet (0.5 mg total) by mouth 3 (three) times daily as needed for sleep or anxiety. 12/21/16 12/21/17  Dustin Flock, MD  cholecalciferol (VITAMIN D) 1000 units tablet Take 2,000  Units by mouth daily.    [provider]  citalopram (CELEXA) 40 MG tablet Take by mouth daily.  07/24/15   [provider]  diclofenac sodium (VOLTAREN) 1 % GEL Apply 4 g topically every 8 (eight) hours as needed. 12/06/16 06/04/17  Milinda Pointer, MD  fluticasone (FLONASE) 50 MCG/ACT nasal spray Place 1-2 sprays into both nostrils daily.     [provider]  Folate-B12-Intrinsic Factor (INTRINSI B12-FOLATE) 270-623-76 MCG-MCG-MG TABS Take 1 tablet by mouth daily. Reported on 04/06/2015    [provider]  furosemide (LASIX) 40 MG tablet Take 1 tablet (40 mg total) by mouth 2 (two) times daily. Patient taking differently: Take 40 mg by mouth daily.  10/26/16   Nicholes Mango, MD  gabapentin (NEURONTIN) 300 MG capsule Take 600 mg by mouth 4 (four) times daily.     [provider]  insulin detemir (LEVEMIR) 100 UNIT/ML injection Inject 0.25 mLs (25 Units total) into the skin 2 (two) times daily. Patient taking differently: Inject 50 Units into the skin 2 (two) times daily.  10/26/16   Gouru, Illene Silver, MD  ipratropium-albuterol (DUONEB) 0.5-2.5 (3) MG/3ML SOLN Take 3 mLs by nebulization every 4 (four) hours as needed. For shortness of breath/wheezing    [provider]  lactulose (CHRONULAC) 10 GM/15ML solution Take 30 mLs (20 g total) by mouth 3 (three) times daily. 10/26/16   Gouru, Illene Silver, MD  lidocaine (LIDODERM) 5 % Place 1 patch onto the skin daily. Remove & Discard patch within 12 hours or as directed by MD 12/16/16   Gregor Hams, MD  nystatin (MYCOSTATIN/NYSTOP) powder Apply 1 g topically 3 (three) times daily as needed. For irritation 10/26/16   Gouru, Illene Silver, MD  pantoprazole (PROTONIX) 40 MG tablet Take 40 mg by mouth 2 (two) times daily before a meal.    [provider]  potassium chloride SA (K-DUR,KLOR-CON) 20 MEQ tablet Take 10-20 mEq by mouth daily as needed. For high blood pressure    [provider]  promethazine  (PHENERGAN) 12.5 MG tablet Take 2 tablets (25 mg total) by mouth every 6 (six) hours as needed for nausea or vomiting. 09/25/16   Dustin Flock, MD  rifaximin (XIFAXAN) 550 MG TABS tablet Take 550 mg by mouth 2 (two) times daily.     [provider]  rizatriptan (MAXALT) 10 MG tablet Take 10 mg by mouth as needed for migraine.     [provider]  spironolactone (ALDACTONE) 100 MG tablet Take 100 mg by mouth daily.     [provider]  vitamin B-12 (CYANOCOBALAMIN) 1000 MCG tablet Take 2,000 mcg by mouth daily.    [provider]  SUMAtriptan (IMITREX) 50 MG tablet Take 50 mg by mouth every 2 (two) hours as needed. For migraines  03/17/11  [provider]    Family History Family History  Problem Relation Age of Onset  . Lung cancer Mother   . Ulcers Father   . Emphysema Father   . Heart disease Sister   . Ulcers Sister   . Heart disease Brother     Social History Social History  Tobacco Use  . Smoking status: Never Smoker  . Smokeless tobacco: Never Used  Substance Use Topics  . Alcohol use: No    Comment: occ  . Drug use: No     Allergies   Tape and Vicodin [hydrocodone-acetaminophen]   Review of Systems Review of Systems  Constitutional: Negative for fever.  Respiratory: Positive for shortness of breath.   Gastrointestinal: Positive for abdominal pain, anorexia and nausea. Negative for vomiting.  Genitourinary: Positive for difficulty urinating.  All other systems reviewed and are negative.    Physical Exam Updated Vital Signs BP 120/74   Pulse 94   Temp 98.4 F (36.9 C) (Oral)   Resp 20   Ht 5' 2"  (1.575 m)   Wt 117.9 kg (260 lb)   SpO2 94%   BMI 47.55 kg/m   Physical Exam  Constitutional: She is oriented to person, place, and time. She appears well-developed and well-nourished.  HENT:  Mouth/Throat: Oropharynx is clear and moist.  Eyes: Conjunctivae are normal.  Neck: Neck supple.  Cardiovascular:  Normal rate and regular rhythm.  Pulmonary/Chest: Effort normal and breath sounds normal.  Abdominal: Soft. Bowel sounds are normal. There is tenderness.  Musculoskeletal: Normal range of motion.  Neurological: She is alert and oriented to person, place, and time.  Skin: Skin is warm and dry.  Psychiatric: She has a normal mood and affect.  Nursing note and vitals reviewed.    ED Treatments / Results  Labs (all labs ordered are listed, but only abnormal results are displayed) Labs Reviewed  COMPREHENSIVE METABOLIC PANEL - Abnormal; Notable for the following components:      Result Value   Chloride 99 (*)    Glucose, Bld 345 (*)    AST 88 (*)    ALT 57 (*)    Total Bilirubin 1.5 (*)    All other components within normal limits  CBC - Abnormal; Notable for the following components:   MCV 101.2 (*)    Platelets 67 (*)    All other components within normal limits  URINALYSIS, ROUTINE W REFLEX MICROSCOPIC - Abnormal; Notable for the following components:   APPearance CLOUDY (*)    Glucose, UA >=500 (*)    Bacteria, UA RARE (*)    Squamous Epithelial / LPF 6-30 (*)    All other components within normal limits  CBG MONITORING, ED - Abnormal; Notable for the following components:   Glucose-Capillary 402 (*)    All other components within normal limits  LIPASE, BLOOD  CBG MONITORING, ED    EKG  EKG Interpretation None       Radiology Dg Abd Acute W/chest  Result Date: 03/13/2017 CLINICAL DATA:  Acute onset of shortness of breath and generalized abdominal pain. EXAM: DG ABDOMEN ACUTE W/ 1V CHEST COMPARISON:  Chest radiograph performed 02/26/2017, and CT of the abdomen and pelvis from 02/18/2017 FINDINGS: The lungs are well-aerated. Mild vascular congestion is noted. There is no evidence of focal opacification, pleural effusion or pneumothorax. The cardiomediastinal silhouette is borderline normal in size. The visualized bowel gas pattern is unremarkable. Scattered stool and  air are seen within the colon; there is no evidence of small bowel dilatation to suggest obstruction. No free intra-abdominal air is identified on the provided upright view. Clips are noted within the right upper quadrant, reflecting prior cholecystectomy. No acute osseous abnormalities are seen; the sacroiliac joints are unremarkable in appearance. Coils are seen overlying the left hemipelvis. IMPRESSION: 1. Unremarkable bowel gas pattern; no free intra-abdominal  air seen. Moderate to large amount of stool noted in the colon. 2. Mild vascular congestion.  Lungs remain grossly clear. Electronically Signed   By: Garald Balding M.D.   On: 03/13/2017 22:42    Procedures Procedures (including critical care time)  Medications Ordered in ED Medications  sodium chloride 0.9 % bolus 1,000 mL (not administered)  ondansetron (ZOFRAN) injection 4 mg (not administered)  fentaNYL (SUBLIMAZE) injection 50 mcg (not administered)     Initial Impression / Assessment and Plan / ED Course  I have reviewed the triage vital signs and the nursing notes.  Pertinent labs & imaging results that were available during my care of the patient were reviewed by me and considered in my medical decision making (see chart for details).     Patient feels better after fluids and medication.  Patient is nontoxic, nonseptic appearing. Patient's pain and other symptoms adequately managed in emergency department.  Fluid bolus given.  Labs, imaging and vitals reviewed. Lab work reassuring. No evidence of DKA. Imaging reveals large stool burden. Patient does not meet the SIRS or Sepsis criteria.  On repeat exam patient does not have a surgical abdomen and there are no peritoneal signs.  No indication of appendicitis, bowel obstruction, bowel perforation, cholecystitis, diverticulitis.  Patient discharged home with symptomatic treatment and given strict instructions for follow-up with their primary care physician (she states she has an  appointment on Thursday).  I have also discussed reasons to return immediately to the ER.  Patient expresses understanding and agrees with plan.     Final Clinical Impressions(s) / ED Diagnoses   Final diagnoses:  Pain of upper abdomen  Constipation, unspecified constipation type    ED Discharge Orders        Ordered    oxyCODONE (ROXICODONE) 5 MG immediate release tablet  Every 6 hours PRN     03/13/17 2343       Etta Quill, NP 03/14/17 0024    Merrily Pew, MD 03/18/17 [id number]

## 2017-03-13 NOTE — ED Notes (Signed)
Bladder scan revealed 166 ml,

## 2017-04-29 ENCOUNTER — Emergency Department
Admission: EM | Admit: 2017-04-29 | Discharge: 2017-04-29 | Disposition: A | Discharge status: Home / Self Care | Payer: Self-pay | Attending: Emergency Medicine | Admitting: Emergency Medicine

## 2017-04-29 ENCOUNTER — Encounter: Payer: Self-pay | Admitting: Emergency Medicine

## 2017-04-29 ENCOUNTER — Other Ambulatory Visit: Payer: Self-pay

## 2017-04-29 ENCOUNTER — Emergency Department: Payer: Self-pay

## 2017-04-29 DIAGNOSIS — I130 Hypertensive heart and chronic kidney disease with heart failure and stage 1 through stage 4 chronic kidney disease, or unspecified chronic kidney disease: Secondary | ICD-10-CM | POA: Insufficient documentation

## 2017-04-29 DIAGNOSIS — N189 Chronic kidney disease, unspecified: Secondary | ICD-10-CM | POA: Insufficient documentation

## 2017-04-29 DIAGNOSIS — E039 Hypothyroidism, unspecified: Secondary | ICD-10-CM | POA: Insufficient documentation

## 2017-04-29 DIAGNOSIS — Z8673 Personal history of transient ischemic attack (TIA), and cerebral infarction without residual deficits: Secondary | ICD-10-CM | POA: Insufficient documentation

## 2017-04-29 DIAGNOSIS — R55 Syncope and collapse: Secondary | ICD-10-CM

## 2017-04-29 DIAGNOSIS — Y998 Other external cause status: Secondary | ICD-10-CM | POA: Insufficient documentation

## 2017-04-29 DIAGNOSIS — E119 Type 2 diabetes mellitus without complications: Secondary | ICD-10-CM | POA: Insufficient documentation

## 2017-04-29 DIAGNOSIS — I5032 Chronic diastolic (congestive) heart failure: Secondary | ICD-10-CM | POA: Insufficient documentation

## 2017-04-29 DIAGNOSIS — S93602A Unspecified sprain of left foot, initial encounter: Secondary | ICD-10-CM

## 2017-04-29 DIAGNOSIS — Z79899 Other long term (current) drug therapy: Secondary | ICD-10-CM | POA: Insufficient documentation

## 2017-04-29 DIAGNOSIS — Y939 Activity, unspecified: Secondary | ICD-10-CM | POA: Insufficient documentation

## 2017-04-29 DIAGNOSIS — R0602 Shortness of breath: Secondary | ICD-10-CM | POA: Insufficient documentation

## 2017-04-29 DIAGNOSIS — J449 Chronic obstructive pulmonary disease, unspecified: Secondary | ICD-10-CM | POA: Insufficient documentation

## 2017-04-29 DIAGNOSIS — W010XXA Fall on same level from slipping, tripping and stumbling without subsequent striking against object, initial encounter: Secondary | ICD-10-CM | POA: Insufficient documentation

## 2017-04-29 DIAGNOSIS — Y929 Unspecified place or not applicable: Secondary | ICD-10-CM | POA: Insufficient documentation

## 2017-04-29 LAB — COMPREHENSIVE METABOLIC PANEL
ALT: 49 U/L (ref 14–54)
AST: 85 U/L — ABNORMAL HIGH (ref 15–41)
Albumin: 3.6 g/dL (ref 3.5–5.0)
Alkaline Phosphatase: 108 U/L (ref 38–126)
Anion gap: 9 (ref 5–15)
BUN: 8 mg/dL (ref 6–20)
CO2: 25 mmol/L (ref 22–32)
Calcium: 9 mg/dL (ref 8.9–10.3)
Chloride: 101 mmol/L (ref 101–111)
Creatinine, Ser: 0.55 mg/dL (ref 0.44–1.00)
GFR calc Af Amer: >60 mL/min (ref >60)
GFR calc non Af Amer: >60 mL/min (ref >60)
Glucose, Bld: 366 mg/dL — ABNORMAL HIGH (ref 65–99)
Potassium: 3.8 mmol/L (ref 3.5–5.1)
Sodium: 135 mmol/L (ref 135–145)
Total Bilirubin: 1.6 mg/dL — ABNORMAL HIGH (ref 0.3–1.2)
Total Protein: 6.8 g/dL (ref 6.5–8.1)

## 2017-04-29 LAB — CBC
HCT: 42.4 % (ref 35.0–47.0)
Hemoglobin: 14.7 g/dL (ref 12.0–16.0)
MCH: 34.8 pg — ABNORMAL HIGH (ref 26.0–34.0)
MCHC: 34.6 g/dL (ref 32.0–36.0)
MCV: 100.4 fL — ABNORMAL HIGH (ref 80.0–100.0)
Platelets: 80 10*3/uL — ABNORMAL LOW (ref 150–440)
RBC: 4.22 MIL/uL (ref 3.80–5.20)
RDW: 13.9 % (ref 11.5–14.5)
WBC: 3.5 10*3/uL — ABNORMAL LOW (ref 3.6–11.0)

## 2017-04-29 LAB — TROPONIN I: Troponin I: <0.03 ng/mL (ref <0.03)

## 2017-04-29 MED ORDER — OXYCODONE-ACETAMINOPHEN 5-325 MG PO TABS
1.0000 | ORAL_TABLET | Freq: Once | ORAL | Status: AC
  Administered 2017-04-29: 1 via ORAL

## 2017-04-29 MED ORDER — OXYCODONE-ACETAMINOPHEN 5-325 MG PO TABS
1.0000 | ORAL_TABLET | ORAL | Status: DC | PRN
  Administered 2017-04-29: 1 via ORAL
  Filled 2017-04-29 (×2): qty 1

## 2017-04-29 MED ORDER — OXYCODONE-ACETAMINOPHEN 5-325 MG PO TABS
1.0000 | ORAL_TABLET | Freq: Once | ORAL | Status: AC
Start: 2017-04-29 — End: 2017-04-29
  Administered 2017-04-29: 1 via ORAL
  Filled 2017-04-29: qty 1

## 2017-04-29 MED ORDER — IBUPROFEN 600 MG PO TABS: 600.0000 mg | ORAL_TABLET | Freq: Three times a day (TID) | ORAL | 0 refills | Status: DC | PRN

## 2017-04-29 MED ORDER — IPRATROPIUM-ALBUTEROL 0.5-2.5 (3) MG/3ML IN SOLN
3.0000 mL | Freq: Once | RESPIRATORY_TRACT | Status: AC
  Administered 2017-04-29: 3 mL via RESPIRATORY_TRACT
  Filled 2017-04-29: qty 3

## 2017-04-29 MED ORDER — IBUPROFEN 600 MG PO TABS
600.0000 mg | ORAL_TABLET | Freq: Once | ORAL | Status: AC
  Administered 2017-04-29: 600 mg via ORAL
  Filled 2017-04-29: qty 1

## 2017-04-29 NOTE — ED Triage Notes (Signed)
Outside a couple hours ago, syncopal episode, twisted L ankle, and feels short of breath since.

## 2017-04-29 NOTE — Discharge Instructions (Signed)
It was a pleasure to take care of you today, and thank you for coming to our emergency department.  If you have any questions or concerns before leaving please ask the nurse to grab me and I'm more than happy to go through your aftercare instructions again.  If you were prescribed any opioid pain medication today such as Norco, Vicodin, Percocet, morphine, hydrocodone, or oxycodone please make sure you do not drive when you are taking this medication as it can alter your ability to drive safely.  If you have any concerns once you are home that you are not improving or are in fact getting worse before you can make it to your follow-up appointment, please do not hesitate to call 911 and come back for further evaluation.  Darel Hong, MD  Results for orders placed or performed during the hospital encounter of 04/29/17  CBC  Result Value Ref Range   WBC 3.5 (L) 3.6 - 11.0 K/uL   RBC 4.22 3.80 - 5.20 MIL/uL   Hemoglobin 14.7 12.0 - 16.0 g/dL   HCT 42.4 35.0 - 47.0 %   MCV 100.4 (H) 80.0 - 100.0 fL   MCH 34.8 (H) 26.0 - 34.0 pg   MCHC 34.6 32.0 - 36.0 g/dL   RDW 13.9 11.5 - 14.5 %   Platelets 80 (L) 150 - 440 K/uL  Troponin I  Result Value Ref Range   Troponin I <0.03 <0.03 ng/mL  Comprehensive metabolic panel  Result Value Ref Range   Sodium 135 135 - 145 mmol/L   Potassium 3.8 3.5 - 5.1 mmol/L   Chloride 101 101 - 111 mmol/L   CO2 25 22 - 32 mmol/L   Glucose, Bld 366 (H) 65 - 99 mg/dL   BUN 8 6 - 20 mg/dL   Creatinine, Ser 0.55 0.44 - 1.00 mg/dL   Calcium 9.0 8.9 - 10.3 mg/dL   Total Protein 6.8 6.5 - 8.1 g/dL   Albumin 3.6 3.5 - 5.0 g/dL   AST 85 (H) 15 - 41 U/L   ALT 49 14 - 54 U/L   Alkaline Phosphatase 108 38 - 126 U/L   Total Bilirubin 1.6 (H) 0.3 - 1.2 mg/dL   GFR calc non Af Amer >60 >60 mL/min   GFR calc Af Amer >60 >60 mL/min   Anion gap 9 5 - 15   Dg Chest 2 View  Result Date: 04/29/2017 CLINICAL DATA:  Patient reports syncopal episode today. Reports SOB since  episode. Denies CP, cough or fever at this time. Hx TIA, HTN, GERD, DM, COPD, asthma, ascites. Non-smoker. EXAM: CHEST - 2 VIEW COMPARISON:  03/13/2017 FINDINGS: Cardiac silhouette is normal in size and configuration. No mediastinal or hilar masses. No evidence of adenopathy. Clear lungs. No pleural effusion or pneumothorax. Skeletal structures are intact. IMPRESSION: No active cardiopulmonary disease. Electronically Signed   By: Lajean Manes M.D.   On: 04/29/2017 15:51   Dg Foot Complete Left  Result Date: 04/29/2017 CLINICAL DATA:  Twisted left foot. EXAM: LEFT FOOT - COMPLETE 3+ VIEW COMPARISON:  None. FINDINGS: The joint spaces are maintained. No acute fracture is identified. Mild degenerative changes at the first MTP joint and mild hallux valgus deformity. Mild pes cavus and moderate size calcaneal heel spur. IMPRESSION: No acute fracture. Electronically Signed   By: Marijo Sanes M.D.   On: 04/29/2017 17:30

## 2017-04-29 NOTE — ED Provider Notes (Signed)
[facility] Emergency Department Provider Note  ____________________________________________   First MD Initiated Contact with Patient 04/29/17 1706     (approximate)  I have reviewed the triage vital signs and the nursing notes.   HISTORY  Chief Complaint Loss of Consciousness and Shortness of Breath   HPI Lori Mckenzie is a 52 y.o. female who presents to the emergency department with left ankle pain after a syncopal event.  She says that she was outside felt lightheaded nauseated and twisted her ankle falling to the ground.  She awoke nearly immediately thereafter.  No bowel or bladder incontinence or hesitance.  No taste of blood in her mouth.  No antecedent chest pain or palpitations.  She has no family history of sudden cardiac death.  She is able to bear weight.  Her symptoms came on suddenly and went away quickly on their own.  They were severe.  Past Medical History:  Diagnosis Date  . Abdominal abscess 08/25/2014  . Acid reflux 08/10/2010  . Acute cervical myofascial strain 02/09/2016  . Acute postoperative pain 08/09/2016  . Anxiety   . Ascites   . Asthma   . Back pain   . Bile leak, postoperative 07/17/2014  . Brittle bone disease   . Cancer ([place])    Uteriine  ca 42yr ago partial hysterectomy  . Cervical disc disease   . Chronic kidney disease   . Collagen vascular disease (HB[place]    RA  3-4 yrs ago  . COPD (chronic obstructive pulmonary disease) (HL[place]   . Diabetes mellitus without complication ([place]   . GERD (gastroesophageal reflux disease)   . Hypertension   . Hypothyroidism   . Left upper quadrant pain 01/09/2014  . Major depressive disorder with single episode 12/05/2011  . Major depressive disorder, single episode 12/05/2011  . Migraines   . NASH (nonalcoholic steatohepatitis)   . Respiratory infection    2/17  . Shock ([place] 09/18/2014  . Sleep apnea   . Sleep apnea   . Syncope 11/16/2014  . Thyroid disease   . TIA (transient ischemic  attack)     Patient Active Problem List   Diagnosis Date Noted  . Neurogenic pain 02/13/2017  . Chest pain 12/20/2016  . Chronic feet pain (Secondary source of pain) (Bilateral) (R>L) 10/26/2016  . Acute hepatic encephalopathy 09/23/2016  . Low back pain due to L1-2 disc extrusion (caudal) (Left) 07/11/2016  . Abdominal pain 06/28/2016  . Constipation 06/20/2016  . Hyperglycemia 06/20/2016  . Nausea without vomiting 06/06/2016  . Chronic lower extremity pain (Bilateral) 05/19/2016  . Spinal stenosis, thoracic region (T10-11) 05/19/2016  . Diabetes mellitus, insulin dependent (IDDM), uncontrolled ([place] 04/12/2016  . Chronic sacroiliac joint pain (Bilateral) (L>R) 03/23/2016  . Chronic upper extremity pain (Left) 03/10/2016  . Chronic radicular cervical pain (L) 03/10/2016  . Osteoarthritis 02/24/2016  . Allodynia 02/09/2016  . Chronic pain syndrome 01/07/2016  . Opiate withdrawal (HG[place] 12/22/2015  . Elevated liver enzymes 09/28/2015  . Cirrhosis of liver without ascites ([place] 09/24/2015  . Cirrhosis ([place] 09/24/2015  . Altered mental status 07/24/2015  . Uncontrolled diabetes mellitus (HL[place] 07/24/2015  . Radicular pain of thoracic region 04/06/2015  . Hepatic encephalopathy ([place] 03/11/2015  . Elevated sedimentation rate 03/05/2015  . Elevated C-reactive protein (CRP) 03/05/2015  . Lumbar facet syndrome (Bilateral) (R>L) 03/05/2015  . Cervical spondylosis 03/05/2015  . Lumbar spondylosis 03/05/2015  . Encounter for chronic pain management 03/05/2015  . Chronic shoulder pain (Bilateral) (R>L) 03/05/2015  .  Chronic carpal tunnel syndrome (Bilateral) 03/05/2015  . Chronic hip pain (Bilateral) (L>R) 03/05/2015  . Chronic upper back pain (Bilateral) (L>R) 03/05/2015  . Osteoporosis, idiopathic 03/05/2015  . Abnormal MRI, lumbar spine (02/03/2015) 03/05/2015  . Thoracic radiculitis 02/05/2015  . Vitamin D deficiency 01/13/2015  . B12 deficiency 01/13/2015  . Folate deficiency  01/13/2015  . Subacute lumbar radiculopathy (left side) (S1 dermatome) 12/10/2014  . Drowsiness 11/16/2014  . Episode of syncope 11/16/2014  . Somnolence 11/16/2014  . Opiate use (75 MME/Day) 10/28/2014  . Long term prescription opiate use 10/28/2014  . Long term current use of opiate analgesic 10/28/2014  . Encounter for therapeutic drug level monitoring 10/28/2014  . Chronic epigastric abdominal pain 10/28/2014  . Chronic low back pain (Primary Source of Pain) (Bilateral) (R>L) 10/28/2014  . Chronic neck pain [facility] source of pain) (Bilateral) (R>L) 10/28/2014  . Ascites 09/05/2014  . NASH (nonalcoholic steatohepatitis) 09/05/2014  . Hypokalemia 09/05/2014  . Dysthymia 08/05/2014  . Other social stressor 08/05/2014  . Steatohepatitis 07/15/2014  . Type 2 diabetes mellitus ([place]) 07/12/2014  . COPD with acute exacerbation ([place]) 07/12/2014  . GERD (gastroesophageal reflux disease) 07/12/2014  . OSA on CPAP 07/12/2014  . Anxiety 07/12/2014  . 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 Lumbar and sacral osteoarthritis 03/26/2014  . Myofascial pain 03/26/2014  . Lumbar spinal stenosis 03/26/2014  . Lumbosacral spondylosis without myelopathy 03/26/2014  . 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 Lumbar central spinal stenosis (L1-2 and L4-5) 03/26/2014  . Spondylosis of lumbar region without myelopathy or radiculopathy 03/26/2014  . Breath shortness 01/09/2014  . Diastolic dysfunction with chronic heart failure ([place]) 08/06/2013  . Airway hyperreactivity 08/06/2013  . Essential (primary) hypertension 08/06/2013  . Asthma 08/06/2013  . Clinical depression 12/05/2011    Past Surgical History:  Procedure Laterality Date  . ABDOMINAL HYSTERECTOMY    . CHOLECYSTECTOMY N/A 07/15/2014   Procedure: LAPAROSCOPIC CHOLECYSTECTOMY with liver biopsy ;  Surgeon: Sherri Rad, MD;  Location: [facility] ORS;  Service: General;  Laterality: N/A;  . COLONOSCOPY WITH PROPOFOL N/A 06/23/2014    Procedure: COLONOSCOPY WITH PROPOFOL;  Surgeon: Lollie Sails, MD;  Location: [facility] ENDOSCOPY;  Service: Endoscopy;  Laterality: N/A;  . ERCP N/A 07/16/2014   Procedure: ENDOSCOPIC RETROGRADE CHOLANGIOPANCREATOGRAPHY (ERCP);  Surgeon: Clarene Essex, MD;  Location: [facility] ENDOSCOPY;  Service: Endoscopy;  Laterality: N/A;  . ERCP N/A 10/03/2014   Procedure: ENDOSCOPIC RETROGRADE CHOLANGIOPANCREATOGRAPHY (ERCP);  Surgeon: Hulen Luster, MD;  Location: [facility] ENDOSCOPY;  Service: Gastroenterology;  Laterality: N/A;  . ESOPHAGOGASTRODUODENOSCOPY N/A 06/23/2014   Procedure: ESOPHAGOGASTRODUODENOSCOPY (EGD);  Surgeon: Lollie Sails, MD;  Location: [facility] ENDOSCOPY;  Service: Endoscopy;  Laterality: N/A;  . ESOPHAGOGASTRODUODENOSCOPY N/A 06/30/2016   Procedure: ESOPHAGOGASTRODUODENOSCOPY (EGD);  Surgeon: Jonathon Bellows, MD;  Location: [facility] ENDOSCOPY;  Service: Endoscopy;  Laterality: N/A;  . ESOPHAGOGASTRODUODENOSCOPY (EGD) WITH PROPOFOL N/A 12/01/2015   Procedure: ESOPHAGOGASTRODUODENOSCOPY (EGD) WITH PROPOFOL;  Surgeon: Lollie Sails, MD;  Location[facility] END[facility]COPY;  Service: Endoscopy;  Laterality: N/A;  . TUBAL LIGATION    . WISDOM TOOTH EXTRACTION      Prior to Admission medications   Medication Sig Start Date End Date Taking? Authorizing Provider  albuterol (PROVENTIL HFA;VENTOLIN HFA) 108 (90 Base) MCG/ACT inhaler Inhale 2 puffs into the lungs every 6 (six) hours as needed for wheezing or shortness of breath.   Yes [provider]  ALPRAZolam (XANAX) 0.5 MG tablet Take 1 tablet (0.5 mg total) by mouth 3 (three) times daily  as needed for sleep or anxiety. 12/21/16 12/21/17 Yes Dustin Flock, MD  cholecalciferol (VITAMIN D) 1000 units tablet Take 2,000 Units by mouth daily.   Yes [provider]  citalopram (CELEXA) 40 MG tablet Take by mouth daily.  07/24/15  Yes [provider]  cyclobenzaprine (FLEXERIL) 10 MG tablet Take 10 mg by mouth 3 (three) times daily as needed for muscle  spasms.   Yes [provider]  diclofenac sodium (VOLTAREN) 1 % GEL Apply 4 g topically every 8 (eight) hours as needed. 12/06/16 06/04/17 Yes Milinda Pointer, MD  dicyclomine (BENTYL) 10 MG capsule Take 1 capsule by mouth 4 (four) times daily as needed.   Yes [provider]  fluticasone (FLONASE) 50 MCG/ACT nasal spray Place 1-2 sprays into both nostrils daily.    Yes [provider]  Folate-B12-Intrinsic Factor (INTRINSI B12-FOLATE) 573-220-25 MCG-MCG-MG TABS Take 1 tablet by mouth daily.    Yes [provider]  furosemide (LASIX) 40 MG tablet Take 1 tablet (40 mg total) by mouth 2 (two) times daily. Patient taking differently: Take 40 mg by mouth 2 (two) times daily as needed for fluid.  10/26/16  Yes Gouru, Illene Silver, MD  gabapentin (NEURONTIN) 300 MG capsule Take 900 mg by mouth 4 (four) times daily.    Yes [provider]  insulin detemir (LEVEMIR) 100 UNIT/ML injection Inject 0.25 mLs (25 Units total) into the skin 2 (two) times daily. Patient taking differently: Inject 86 Units into the skin 2 (two) times daily.  10/26/16  Yes Gouru, Aruna, MD  ipratropium-albuterol (DUONEB) 0.5-2.5 (3) MG/3ML SOLN Take 3 mLs by nebulization every 4 (four) hours as needed. For shortness of breath/wheezing   Yes [provider]  lactulose (CHRONULAC) 10 GM/15ML solution Take 30 mLs (20 g total) by mouth 3 (three) times daily. 10/26/16  Yes Gouru, Illene Silver, MD  nystatin (MYCOSTATIN/NYSTOP) powder Apply 1 g topically 3 (three) times daily as needed. For irritation 10/26/16  Yes Gouru, Aruna, MD  oxyCODONE (ROXICODONE) 5 MG immediate release tablet Take 1 tablet (5 mg total) by mouth every 6 (six) hours as needed for severe pain. 03/13/17  Yes Etta Quill, NP  pantoprazole (PROTONIX) 40 MG tablet Take 40 mg by mouth 2 (two) times daily before a meal.   Yes [provider]  potassium chloride SA (K-DUR,KLOR-CON) 20 MEQ tablet Take 10-20 mEq by mouth daily as  needed. For high blood pressure   Yes [provider]  promethazine (PHENERGAN) 12.5 MG tablet Take 2 tablets (25 mg total) by mouth every 6 (six) hours as needed for nausea or vomiting. 09/25/16  Yes Dustin Flock, MD  rifaximin (XIFAXAN) 550 MG TABS tablet Take 550 mg by mouth 2 (two) times daily.    Yes [provider]  rizatriptan (MAXALT) 10 MG tablet Take 10 mg by mouth as needed for migraine.    Yes [provider]  spironolactone (ALDACTONE) 100 MG tablet Take 100 mg by mouth daily.    Yes [provider]  vitamin B-12 (CYANOCOBALAMIN) 1000 MCG tablet Take 2,000 mcg by mouth daily.   Yes [provider]  ibuprofen (ADVIL,MOTRIN) 600 MG tablet Take 1 tablet (600 mg total) by mouth every 8 (eight) hours as needed. 04/29/17   Darel Hong, MD  lidocaine (LIDODERM) 5 % Place 1 patch onto the skin daily. Remove & Discard patch within 12 hours or as directed by MD Patient not taking: Reported on 04/29/2017 12/16/16   Gregor Hams, MD  SUMAtriptan (  IMITREX) 50 MG tablet Take 50 mg by mouth every 2 (two) hours as needed. For migraines  03/17/11  [provider]    Allergies Tape and Vicodin [hydrocodone-acetaminophen]  Family History  Problem Relation Age of Onset  . Lung cancer Mother   . Ulcers Father   . Emphysema Father   . Heart disease Sister   . Ulcers Sister   . Heart disease Brother     Social History Social History   Tobacco Use  . Smoking status: Never Smoker  . Smokeless tobacco: Never Used  Substance Use Topics  . Alcohol use: No    Comment: occ  . Drug use: No    Review of Systems Constitutional: No fever/chills Eyes: No visual changes. ENT: No sore throat. Cardiovascular: Denies chest pain. Respiratory: Denies shortness of breath. Gastrointestinal: No abdominal pain.  No nausea, no vomiting.  No diarrhea.  No constipation. Genitourinary: Negative for dysuria. Musculoskeletal: Positive for left  ankle pain Skin: Negative for rash. Neurological: Negative for headaches, focal weakness or numbness.   ____________________________________________   PHYSICAL EXAM:  VITAL SIGNS: ED Triage Vitals  Enc Vitals Group     BP 04/29/17 1329 134/73     Pulse Rate 04/29/17 1329 (!) 107     Resp 04/29/17 1329 18     Temp 04/29/17 1329 (!) 97.5 F (36.4 C)     Temp Source 04/29/17 1329 Oral     SpO2 04/29/17 1329 93 %     Weight 04/29/17 1332 263 lb (119.3 kg)     Height 04/29/17 1332 5' 2"  (1.575 m)     Head Circumference --      Peak Flow --      Pain Score 04/29/17 1332 8     Pain Loc --      Pain Edu? --      Excl. in [place]? --     Constitutional: Alert and oriented x4 pleasant cooperative speaks full clear sentences no diaphoresis Eyes: PERRL EOMI. Head: Atraumatic. Nose: No congestion/rhinnorhea. Mouth/Throat: No trismus Neck: No stridor.   Cardiovascular: Normal rate, regular rhythm. Grossly normal heart sounds.  Good peripheral circulation. Respiratory: Normal respiratory effort.  No retractions. Lungs CTAB and moving good air Gastrointestinal: Soft nontender Musculoskeletal: Mild tenderness over lateral malleolus on the left with minimal swelling Neurologic:  Normal speech and language. No gross focal neurologic deficits are appreciated. Skin:  Skin is warm, dry and intact. No rash noted. Psychiatric: Mood and affect are normal. Speech and behavior are normal.    ____________________________________________   DIFFERENTIAL includes but not limited to  Cardiogenic syncope, vasovagal syncope, seizure, ankle fracture, ankle sprain ____________________________________________   LABS (all labs ordered are listed, but only abnormal results are displayed)  Labs Reviewed  CBC - Abnormal; Notable for the following components:      Result Value   WBC 3.5 (*)    MCV 100.4 (*)    MCH 34.8 (*)    Platelets 80 (*)    All other components within normal limits    COMPREHENSIVE METABOLIC PANEL - Abnormal; Notable for the following components:   Glucose, Bld 366 (*)    AST 85 (*)    Total Bilirubin 1.6 (*)    All other components within normal limits  TROPONIN I    Lab work reviewed by me consistent with cirrhosis __________________________________________  EKG  ED ECG REPORT I, Darel Hong, the attending physician, personally viewed and interpreted this ECG.  Date: 04/29/2017 EKG Time:  Rate: 105 Rhythm: Sinus tachycardia QRS Axis: normal Intervals: normal ST/T Wave abnormalities: normal Narrative Interpretation: no evidence of acute ischemia  ____________________________________________  RADIOLOGY  Test x-ray reviewed by me with no acute disease Ankle x-ray reviewed by me with no acute fracture ____________________________________________   PROCEDURES  Procedure(s) performed: no  Procedures  Critical Care performed: no  Observation: no ____________________________________________   INITIAL IMPRESSION / ASSESSMENT AND PLAN / ED COURSE  Pertinent labs & imaging results that were available during my care of the patient were reviewed by me and considered in my medical decision making (see chart for details).  The patient had a syncopal event twisting and falling onto her left ankle.  Regarding the ankle pain her x-ray is negative and she is neurovascularly intact.  Likely has a ankle sprain.  Regarding the syncope her symptoms are not entirely consistent with cardiogenic syncope.  EKG shows mild sinus tachycardia.  No right heart strain noted.  She was kept on monitor multiple hours with no ectopy.  At this point I am comfortable discharging patient home with primary care follow-up.  Strict return precautions and given to the patient and family verbalized understanding and agree with plan.      ____________________________________________   FINAL CLINICAL IMPRESSION(S) / ED DIAGNOSES  Final diagnoses:  Syncope,  unspecified syncope type  Sprain of left foot, initial encounter      NEW MEDICATIONS STARTED DURING THIS VISIT:  Discharge Medication List as of 04/29/2017  7:54 PM       Note:  This document was prepared using Dragon voice recognition software and may include unintentional dictation errors.     Darel Hong, MD 05/02/17 2131

## 2017-06-09 ENCOUNTER — Emergency Department: Payer: BLUE CROSS/BLUE SHIELD

## 2017-06-09 ENCOUNTER — Emergency Department
Admission: EM | Admit: 2017-06-09 | Discharge: 2017-06-09 | Disposition: A | Discharge status: Home / Self Care | Payer: BLUE CROSS/BLUE SHIELD | Attending: Emergency Medicine | Admitting: Emergency Medicine

## 2017-06-09 ENCOUNTER — Other Ambulatory Visit: Payer: Self-pay

## 2017-06-09 DIAGNOSIS — Z79899 Other long term (current) drug therapy: Secondary | ICD-10-CM | POA: Insufficient documentation

## 2017-06-09 DIAGNOSIS — Z8673 Personal history of transient ischemic attack (TIA), and cerebral infarction without residual deficits: Secondary | ICD-10-CM | POA: Diagnosis not present

## 2017-06-09 DIAGNOSIS — M549 Dorsalgia, unspecified: Secondary | ICD-10-CM | POA: Insufficient documentation

## 2017-06-09 DIAGNOSIS — Z794 Long term (current) use of insulin: Secondary | ICD-10-CM | POA: Diagnosis not present

## 2017-06-09 DIAGNOSIS — J45909 Unspecified asthma, uncomplicated: Secondary | ICD-10-CM | POA: Insufficient documentation

## 2017-06-09 DIAGNOSIS — Z8542 Personal history of malignant neoplasm of other parts of uterus: Secondary | ICD-10-CM | POA: Diagnosis not present

## 2017-06-09 DIAGNOSIS — R4182 Altered mental status, unspecified: Secondary | ICD-10-CM | POA: Diagnosis present

## 2017-06-09 DIAGNOSIS — R109 Unspecified abdominal pain: Secondary | ICD-10-CM

## 2017-06-09 DIAGNOSIS — R1010 Upper abdominal pain, unspecified: Secondary | ICD-10-CM | POA: Diagnosis not present

## 2017-06-09 DIAGNOSIS — E039 Hypothyroidism, unspecified: Secondary | ICD-10-CM | POA: Insufficient documentation

## 2017-06-09 DIAGNOSIS — J449 Chronic obstructive pulmonary disease, unspecified: Secondary | ICD-10-CM | POA: Diagnosis not present

## 2017-06-09 LAB — CBC
HCT: 39.6 % (ref 35.0–47.0)
Hemoglobin: 13.6 g/dL (ref 12.0–16.0)
MCH: 34.8 pg — ABNORMAL HIGH (ref 26.0–34.0)
MCHC: 34.3 g/dL (ref 32.0–36.0)
MCV: 101.3 fL — ABNORMAL HIGH (ref 80.0–100.0)
Platelets: 67 10*3/uL — ABNORMAL LOW (ref 150–440)
RBC: 3.91 MIL/uL (ref 3.80–5.20)
RDW: 14.9 % — ABNORMAL HIGH (ref 11.5–14.5)
WBC: 3.4 10*3/uL — ABNORMAL LOW (ref 3.6–11.0)

## 2017-06-09 LAB — COMPREHENSIVE METABOLIC PANEL
ALT: 37 U/L (ref 14–54)
AST: 55 U/L — ABNORMAL HIGH (ref 15–41)
Albumin: 3.8 g/dL (ref 3.5–5.0)
Alkaline Phosphatase: 95 U/L (ref 38–126)
Anion gap: 8 (ref 5–15)
BUN: 9 mg/dL (ref 6–20)
CO2: 27 mmol/L (ref 22–32)
Calcium: 8.8 mg/dL — ABNORMAL LOW (ref 8.9–10.3)
Chloride: 103 mmol/L (ref 101–111)
Creatinine, Ser: 0.6 mg/dL (ref 0.44–1.00)
GFR calc Af Amer: >60 mL/min (ref >60)
GFR calc non Af Amer: >60 mL/min (ref >60)
Glucose, Bld: 165 mg/dL — ABNORMAL HIGH (ref 65–99)
Potassium: 4 mmol/L (ref 3.5–5.1)
Sodium: 138 mmol/L (ref 135–145)
Total Bilirubin: 1.5 mg/dL — ABNORMAL HIGH (ref 0.3–1.2)
Total Protein: 6.6 g/dL (ref 6.5–8.1)

## 2017-06-09 LAB — GLUCOSE, CAPILLARY: Glucose-Capillary: 171 mg/dL — ABNORMAL HIGH (ref 65–99)

## 2017-06-09 LAB — AMMONIA: Ammonia: 55 umol/L — ABNORMAL HIGH (ref 9–35)

## 2017-06-09 NOTE — ED Provider Notes (Addendum)
[facility] Emergency Department Provider Note  ___________________________________________   First MD Initiated Contact with Patient 06/09/17 1339     (approximate)  I have reviewed the triage vital signs and the nursing notes.   HISTORY  Chief Complaint Altered Mental Status   HPI Lori Mckenzie is a 52 y.o. female history of [name] as well as a ascites and encephalopathy on lactulose who is presenting to the emergency department today with somnolence.  At this time she says that she is feeling improved.  She thinks that her mental status earlier could have been affected by 0.5 of Xanax as well as 10 of oxycodone that she took this morning.  She is a history of anxiety as well as chronic back pain, respectively.  She is also reporting right-sided upper and lower abdominal pain which has been consistent over the past 2 to 3 weeks.  She is already been seen at [facility] for this and had a CAT scan which did not show an acute process.  She says that she has intermittent alteration of her mental status that is sometimes related to an elevation in ammonia.  However, she is taken lactulose already today and has had 2 bowel movements.  Also states that she has been very fatigued over the past 2 days because of frequent travel back and forth between here in [place] because of her husband's bladder cancer for which she is being followed at this hospital.  Past Medical History:  Diagnosis Date  . Abdominal abscess 08/25/2014  . Acid reflux 08/10/2010  . Acute cervical myofascial strain 02/09/2016  . Acute postoperative pain 08/09/2016  . Anxiety   . Ascites   . Asthma   . Back pain   . Bile leak, postoperative 07/17/2014  . Brittle bone disease   . Cancer ([place])    Uteriine  ca 33yr ago partial hysterectomy  . Cervical disc disease   . Chronic kidney disease   . Collagen vascular disease ([place]    RA  3-4 yrs ago  . COPD (chronic obstructive pulmonary disease) ([place]   . Diabetes  mellitus without complication ([place]   . GERD (gastroesophageal reflux disease)   . Hypertension   . Hypothyroidism   . Left upper quadrant pain 01/09/2014  . Major depressive disorder with single episode 12/05/2011  . Major depressive disorder, single episode 12/05/2011  . Migraines   . NASH (nonalcoholic steatohepatitis)   . Respiratory infection    2/17  . Shock ([place] 09/18/2014  . Sleep apnea   . Sleep apnea   . Syncope 11/16/2014  . Thyroid disease   . TIA (transient ischemic attack)     Patient Active Problem List   Diagnosis Date Noted  . Neurogenic pain 02/13/2017  . Chest pain 12/20/2016  . Chronic feet pain (Secondary source of pain) (Bilateral) (R>L) 10/26/2016  . Acute hepatic encephalopathy 09/23/2016  . Low back pain due to L1-2 disc extrusion (caudal) (Left) 07/11/2016  . Abdominal pain 06/28/2016  . Constipation 06/20/2016  . Hyperglycemia 06/20/2016  . Nausea without vomiting 06/06/2016  . Chronic lower extremity pain (Bilateral) 05/19/2016  . Spinal stenosis, thoracic region (T10-11) 05/19/2016  . Diabetes mellitus, insulin dependent (IDDM), uncontrolled ([place] 04/12/2016  . Chronic sacroiliac joint pain (Bilateral) (L>R) 03/23/2016  . Chronic upper extremity pain (Left) 03/10/2016  . Chronic radicular cervical pain (L) 03/10/2016  . Osteoarthritis 02/24/2016  . Allodynia 02/09/2016  . Chronic pain syndrome 01/07/2016  . Opiate withdrawal ([place] 12/22/2015  .  Elevated liver enzymes 09/28/2015  . Cirrhosis of liver without ascites ([place]) 09/24/2015  . Cirrhosis ([place]) 09/24/2015  . Altered mental status 07/24/2015  . Uncontrolled diabetes mellitus ([place]) 07/24/2015  . Radicular pain of thoracic region 04/06/2015  . Hepatic encephalopathy ([place]) 03/11/2015  . Elevated sedimentation rate 03/05/2015  . Elevated C-reactive protein (CRP) 03/05/2015  . Lumbar facet syndrome (Bilateral) (R>L) 03/05/2015  . Cervical spondylosis 03/05/2015  . Lumbar spondylosis  03/05/2015  . Encounter for chronic pain management 03/05/2015  . Chronic shoulder pain (Bilateral) (R>L) 03/05/2015  . Chronic carpal tunnel syndrome (Bilateral) 03/05/2015  . Chronic hip pain (Bilateral) (L>R) 03/05/2015  . Chronic upper back pain (Bilateral) (L>R) 03/05/2015  . Osteoporosis, idiopathic 03/05/2015  . Abnormal MRI, lumbar spine (02/03/2015) 03/05/2015  . Thoracic radiculitis 02/05/2015  . Vitamin D deficiency 01/13/2015  . B12 deficiency 01/13/2015  . Folate deficiency 01/13/2015  . Subacute lumbar radiculopathy (left side) (S1 dermatome) 12/10/2014  . Drowsiness 11/16/2014  . Episode of syncope 11/16/2014  . Somnolence 11/16/2014  . Opiate use (75 MME/Day) 10/28/2014  . Long term prescription opiate use 10/28/2014  . Long term current use of opiate analgesic 10/28/2014  . Encounter for therapeutic drug level monitoring 10/28/2014  . Chronic epigastric abdominal pain 10/28/2014  . Chronic low back pain (Primary Source of Pain) (Bilateral) (R>L) 10/28/2014  . Chronic neck pain [facility] source of pain) (Bilateral) (R>L) 10/28/2014  . Ascites 09/05/2014  . NASH (nonalcoholic steatohepatitis) 09/05/2014  . Hypokalemia 09/05/2014  . Dysthymia 08/05/2014  . Other social stressor 08/05/2014  . Steatohepatitis 07/15/2014  . Type 2 diabetes mellitus ([place]) 07/12/2014  . COPD with acute exacerbation ([place]) 07/12/2014  . GERD (gastroesophageal reflux disease) 07/12/2014  . OSA on CPAP 07/12/2014  . Anxiety 07/12/2014  . 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 Lumbar and sacral osteoarthritis 03/26/2014  . Myofascial pain 03/26/2014  . Lumbar spinal stenosis 03/26/2014  . Lumbosacral spondylosis without myelopathy 03/26/2014  . 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 Lumbar central spinal stenosis (L1-2 and L4-5) 03/26/2014  . Spondylosis of lumbar region without myelopathy or radiculopathy 03/26/2014  . Breath shortness 01/09/2014  . Diastolic  dysfunction with chronic heart failure ([place]) 08/06/2013  . Airway hyperreactivity 08/06/2013  . Essential (primary) hypertension 08/06/2013  . Asthma 08/06/2013  . Clinical depression 12/05/2011    Past Surgical History:  Procedure Laterality Date  . ABDOMINAL HYSTERECTOMY    . CHOLECYSTECTOMY N/A 07/15/2014   Procedure: LAPAROSCOPIC CHOLECYSTECTOMY with liver biopsy ;  Surgeon: Sherri Rad, MD;  Location: [facility] ORS;  Service: General;  Laterality: N/A;  . COLONOSCOPY WITH PROPOFOL N/A 06/23/2014   Procedure: COLONOSCOPY WITH PROPOFOL;  Surgeon: Lollie Sails, MD;  Location: [facility] ENDOSCOPY;  Service: Endoscopy;  Laterality: N/A;  . ERCP N/A 07/16/2014   Procedure: ENDOSCOPIC RETROGRADE CHOLANGIOPANCREATOGRAPHY (ERCP);  Surgeon: Clarene Essex, MD;  Location: [facility] ENDOSCOPY;  Service: Endoscopy;  Laterality: N/A;  . ERCP N/A 10/03/2014   Procedure: ENDOSCOPIC RETROGRADE CHOLANGIOPANCREATOGRAPHY (ERCP);  Surgeon: Hulen Luster, MD;  Location: [facility] ENDOSCOPY;  Service: Gastroenterology;  Laterality: N/A;  . ESOPHAGOGASTRODUODENOSCOPY N/A 06/23/2014   Procedure: ESOPHAGOGASTRODUODENOSCOPY (EGD);  Surgeon: Lollie Sails, MD;  Location: [facility] ENDOSCOPY;  Service: Endoscopy;  Laterality: N/A;  . ESOPHAGOGASTRODUODENOSCOPY N/A 06/30/2016   Procedure: ESOPHAGOGASTRODUODENOSCOPY (EGD);  Surgeon: Jonathon Bellows, MD;  Location: [facility] ENDOSCOPY;  Service: Endoscopy;  Laterality: N/A;  . ESOPHAGOGASTRODUODENOSCOPY (EGD) WITH PROPOFOL N/A 12/01/2015   Procedure: ESOPHAGOGASTRODUODENOSCOPY (EGD) WITH PROPOFOL;  Surgeon: Lollie Sails,  MD;  Location: [facility] ENDOSCOPY;  Service: Endoscopy;  Laterality: N/A;  . TUBAL LIGATION    . WISDOM TOOTH EXTRACTION      Prior to Admission medications   Medication Sig Start Date End Date Taking? Authorizing Provider  albuterol (PROVENTIL HFA;VENTOLIN HFA) 108 (90 Base) MCG/ACT inhaler Inhale 2 puffs into the lungs every 6 (six) hours as needed for wheezing or shortness of breath.     [provider]  ALPRAZolam Duanne Moron) 0.5 MG tablet Take 1 tablet (0.5 mg total) by mouth 3 (three) times daily as needed for sleep or anxiety. 12/21/16 12/21/17  Dustin Flock, MD  cholecalciferol (VITAMIN D) 1000 units tablet Take 2,000 Units by mouth daily.    [provider]  citalopram (CELEXA) 40 MG tablet Take by mouth daily.  07/24/15   [provider]  cyclobenzaprine (FLEXERIL) 10 MG tablet Take 10 mg by mouth 3 (three) times daily as needed for muscle spasms.    [provider]  diclofenac sodium (VOLTAREN) 1 % GEL Apply 4 g topically every 8 (eight) hours as needed. 12/06/16 06/04/17  Milinda Pointer, MD  dicyclomine (BENTYL) 10 MG capsule Take 1 capsule by mouth 4 (four) times daily as needed.    [provider]  fluticasone (FLONASE) 50 MCG/ACT nasal spray Place 1-2 sprays into both nostrils daily.     [provider]  Folate-B12-Intrinsic Factor (INTRINSI B12-FOLATE) 625-638-93 MCG-MCG-MG TABS Take 1 tablet by mouth daily.     [provider]  furosemide (LASIX) 40 MG tablet Take 1 tablet (40 mg total) by mouth 2 (two) times daily. Patient taking differently: Take 40 mg by mouth 2 (two) times daily as needed for fluid.  10/26/16   Nicholes Mango, MD  gabapentin (NEURONTIN) 300 MG capsule Take 900 mg by mouth 4 (four) times daily.     [provider]  ibuprofen (ADVIL,MOTRIN) 600 MG tablet Take 1 tablet (600 mg total) by mouth every 8 (eight) hours as needed. 04/29/17   Darel Hong, MD  insulin detemir (LEVEMIR) 100 UNIT/ML injection Inject 0.25 mLs (25 Units total) into the skin 2 (two) times daily. Patient taking differently: Inject 86 Units into the skin 2 (two) times daily.  10/26/16   Gouru, Illene Silver, MD  ipratropium-albuterol (DUONEB) 0.5-2.5 (3) MG/3ML SOLN Take 3 mLs by nebulization every 4 (four) hours as needed. For shortness of breath/wheezing    [provider]  lactulose (CHRONULAC) 10 GM/15ML  solution Take 30 mLs (20 g total) by mouth 3 (three) times daily. 10/26/16   Gouru, Illene Silver, MD  lidocaine (LIDODERM) 5 % Place 1 patch onto the skin daily. Remove & Discard patch within 12 hours or as directed by MD Patient not taking: Reported on 04/29/2017 12/16/16   Gregor Hams, MD  nystatin (MYCOSTATIN/NYSTOP) powder Apply 1 g topically 3 (three) times daily as needed. For irritation 10/26/16   Gouru, Illene Silver, MD  oxyCODONE (ROXICODONE) 5 MG immediate release tablet Take 1 tablet (5 mg total) by mouth every 6 (six) hours as needed for severe pain. 03/13/17   Etta Quill, NP  pantoprazole (PROTONIX) 40 MG tablet Take 40 mg by mouth 2 (two) times daily before a meal.    [provider]  potassium chloride SA (K-DUR,KLOR-CON) 20 MEQ tablet Take 10-20 mEq by mouth daily as needed. For high blood pressure    [provider]  promethazine (PHENERGAN) 12.5 MG tablet Take 2 tablets (25 mg total) by mouth every 6 (six) hours as needed for  nausea or vomiting. 09/25/16   Dustin Flock, MD  rifaximin (XIFAXAN) 550 MG TABS tablet Take 550 mg by mouth 2 (two) times daily.     [provider]  rizatriptan (MAXALT) 10 MG tablet Take 10 mg by mouth as needed for migraine.     [provider]  spironolactone (ALDACTONE) 100 MG tablet Take 100 mg by mouth daily.     [provider]  vitamin B-12 (CYANOCOBALAMIN) 1000 MCG tablet Take 2,000 mcg by mouth daily.    [provider]  SUMAtriptan (IMITREX) 50 MG tablet Take 50 mg by mouth every 2 (two) hours as needed. For migraines  03/17/11  [provider]    Allergies Tape and Vicodin [hydrocodone-acetaminophen]  Family History  Problem Relation Age of Onset  . Lung cancer Mother   . Ulcers Father   . Emphysema Father   . Heart disease Sister   . Ulcers Sister   . Heart disease Brother     Social History Social History   Tobacco Use  . Smoking status: Never Smoker  . Smokeless tobacco:  Never Used  Substance Use Topics  . Alcohol use: No    Comment: occ  . Drug use: No    Review of Systems  Constitutional: No fever/chills Eyes: No visual changes. ENT: No sore throat. Cardiovascular: Denies chest pain. Respiratory: Denies shortness of breath. Gastrointestinal:   No diarrhea.  No constipation. Genitourinary: Negative for dysuria. Musculoskeletal: Negative for back pain. Skin: Negative for rash. Neurological: Negative for headaches, focal weakness or numbness.   ____________________________________________   PHYSICAL EXAM:  VITAL SIGNS: ED Triage Vitals  Enc Vitals Group     BP 06/09/17 1322 101/77     Pulse Rate 06/09/17 1322 83     Resp 06/09/17 1322 15     Temp 06/09/17 1315 98 F (36.7 C)     Temp Source 06/09/17 1315 Oral     SpO2 06/09/17 1322 95 %     Weight 06/09/17 1323 280 lb (127 kg)     Height 06/09/17 1323 5' 4"  (1.626 m)     Head Circumference --      Peak Flow --      Pain Score 06/09/17 1328 0     Pain Loc --      Pain Edu? --      Excl. in [place]? --     Constitutional: Alert and oriented. Well appearing and in no acute distress. Eyes: Conjunctivae are normal.  Head: Atraumatic. Nose: No congestion/rhinnorhea. Mouth/Throat: Mucous membranes are moist.  Neck: No stridor.   Cardiovascular: Normal rate, regular rhythm. Grossly normal heart sounds.   Respiratory: Normal respiratory effort.  No retractions. Lungs CTAB. Gastrointestinal: Soft with mild right upper and right lower quadrant tenderness to palpation which the patient says is chronic.  No rebound or guarding. No distention.  Musculoskeletal: No lower extremity tenderness nor edema.  No joint effusions. Neurologic:  Normal speech and language. No gross focal neurologic deficits are appreciated.  No asterixis. Skin:  Skin is warm, dry and intact. No rash noted. Psychiatric: Mood and affect are normal. Speech and behavior are  normal.  ____________________________________________   LABS (all labs ordered are listed, but only abnormal results are displayed)  Labs Reviewed  GLUCOSE, CAPILLARY - Abnormal; Notable for the following components:      Result Value   Glucose-Capillary 171 (*)    All other components within normal limits  COMPREHENSIVE METABOLIC PANEL - Abnormal; Notable for the  following components:   Glucose, Bld 165 (*)    Calcium 8.8 (*)    AST 55 (*)    Total Bilirubin 1.5 (*)    All other components within normal limits  AMMONIA - Abnormal; Notable for the following components:   Ammonia 55 (*)    All other components within normal limits  CBC - Abnormal; Notable for the following components:   WBC 3.4 (*)    MCV 101.3 (*)    MCH 34.8 (*)    RDW 14.9 (*)    Platelets 67 (*)    All other components within normal limits  CBG MONITORING, ED   ____________________________________________  EKG   ____________________________________________  RADIOLOGY   ____________________________________________   PROCEDURES  Procedure(s) performed:   Procedures  Critical Care performed:   ____________________________________________   INITIAL IMPRESSION / ASSESSMENT AND PLAN / ED COURSE  Pertinent labs & imaging results that were available during my care of the patient were reviewed by me and considered in my medical decision making (see chart for details).  Differential diagnosis includes, but is not limited to, alcohol, illicit or prescription medications, or other toxic ingestion; intracranial pathology such as stroke or intracerebral hemorrhage; fever or infectious causes including sepsis; hypoxemia and/or hypercarbia; uremia; trauma; endocrine related disorders such as diabetes, hypoglycemia, and thyroid-related diseases; hypertensive encephalopathy; etc. As part of my medical decision making, I reviewed the following data within the electronic MEDICAL RECORD NUMBER Notes from prior ED  visits  ----------------------------------------- 3:50 PM on 06/09/2017 -----------------------------------------  Patient with what appears to be at baseline pneumonia.  Mental status is improving.  Chronic abdominal pain.  I also reviewed her work-up from [facility] several weeks ago which had a CAT scan without acute process.  I do not believe she has cholangitis at this time.  Negative white blood cell count.  Negative for fever.  More likely that the patient had a temporary sedation from her oxycodone and Xanax that is now wearing off.  Both her and her husband agree that she has returned to her baseline mental status.  No focal neurological findings.  Patient will return for any worsening or concerning symptoms.  She is understanding the treatment plan willing to comply.  Abdominal pain appears chronic and unchanged.  I do not believe there is an acute process at this time that would require further imaging or surgical consultation.  We discussed not combining oxycodone and Xanax in the future as this may cause oversedation.  I believe this is likely the cause of the patient's altered mentation today.  Patient does not report any suicidal or homicidal ideation.  I do not believe that this was an attempt at self-harm. ____________________________________________   FINAL CLINICAL IMPRESSION(S) / ED DIAGNOSES  Altered mental status.  Medication overdose.  Abdominal pain.    NEW MEDICATIONS STARTED DURING THIS VISIT:  New Prescriptions   No medications on file     Note:  This document was prepared using Dragon voice recognition software and may include unintentional dictation errors.     Orbie Pyo, MD 06/09/17 1551    Schaevitz, Randall An, MD 06/09/17 [phone]

## 2017-06-09 NOTE — ED Notes (Signed)
Pt sleepy states she cant hold eyes open/continous pulse ox noted/ sr up times 2 for safety/ HOB at 45 degree angle/skin warm and dry./ resp even and nonlabored at this time

## 2017-06-09 NOTE — ED Triage Notes (Signed)
Pt to ER with c/o AMS, drowsiness. Pt falling asleep at time of triage. Pt diabetic, CBG checked. Husband reports patient is usually alert. Pt alert to name-disoriented to time, place, situation.

## 2017-06-11 ENCOUNTER — Inpatient Hospital Stay (HOSPITAL_COMMUNITY)
Admission: EM | Admit: 2017-06-11 | Discharge: 2017-06-13 | DRG: 690 | Disposition: A | Discharge status: Home / Self Care | Payer: BLUE CROSS/BLUE SHIELD | Attending: Internal Medicine | Admitting: Internal Medicine

## 2017-06-11 ENCOUNTER — Other Ambulatory Visit: Payer: Self-pay

## 2017-06-11 ENCOUNTER — Emergency Department (HOSPITAL_COMMUNITY): Payer: BLUE CROSS/BLUE SHIELD

## 2017-06-11 ENCOUNTER — Encounter (HOSPITAL_COMMUNITY): Payer: Self-pay | Admitting: *Deleted

## 2017-06-11 DIAGNOSIS — N390 Urinary tract infection, site not specified: Secondary | ICD-10-CM | POA: Diagnosis present

## 2017-06-11 DIAGNOSIS — K219 Gastro-esophageal reflux disease without esophagitis: Secondary | ICD-10-CM | POA: Diagnosis present

## 2017-06-11 DIAGNOSIS — N182 Chronic kidney disease, stage 2 (mild): Secondary | ICD-10-CM | POA: Diagnosis present

## 2017-06-11 DIAGNOSIS — Z8249 Family history of ischemic heart disease and other diseases of the circulatory system: Secondary | ICD-10-CM

## 2017-06-11 DIAGNOSIS — Q780 Osteogenesis imperfecta: Secondary | ICD-10-CM | POA: Diagnosis not present

## 2017-06-11 DIAGNOSIS — Z801 Family history of malignant neoplasm of trachea, bronchus and lung: Secondary | ICD-10-CM

## 2017-06-11 DIAGNOSIS — E1122 Type 2 diabetes mellitus with diabetic chronic kidney disease: Secondary | ICD-10-CM | POA: Diagnosis present

## 2017-06-11 DIAGNOSIS — R188 Other ascites: Secondary | ICD-10-CM | POA: Diagnosis present

## 2017-06-11 DIAGNOSIS — Z8673 Personal history of transient ischemic attack (TIA), and cerebral infarction without residual deficits: Secondary | ICD-10-CM | POA: Diagnosis not present

## 2017-06-11 DIAGNOSIS — Z885 Allergy status to narcotic agent status: Secondary | ICD-10-CM

## 2017-06-11 DIAGNOSIS — Z66 Do not resuscitate: Secondary | ICD-10-CM | POA: Diagnosis present

## 2017-06-11 DIAGNOSIS — E039 Hypothyroidism, unspecified: Secondary | ICD-10-CM | POA: Diagnosis present

## 2017-06-11 DIAGNOSIS — J449 Chronic obstructive pulmonary disease, unspecified: Secondary | ICD-10-CM | POA: Diagnosis present

## 2017-06-11 DIAGNOSIS — E119 Type 2 diabetes mellitus without complications: Secondary | ICD-10-CM

## 2017-06-11 DIAGNOSIS — Z794 Long term (current) use of insulin: Secondary | ICD-10-CM

## 2017-06-11 DIAGNOSIS — R109 Unspecified abdominal pain: Secondary | ICD-10-CM | POA: Diagnosis present

## 2017-06-11 DIAGNOSIS — Z6841 Body Mass Index (BMI) 40.0 and over, adult: Secondary | ICD-10-CM

## 2017-06-11 DIAGNOSIS — Z9049 Acquired absence of other specified parts of digestive tract: Secondary | ICD-10-CM

## 2017-06-11 DIAGNOSIS — D696 Thrombocytopenia, unspecified: Secondary | ICD-10-CM | POA: Diagnosis present

## 2017-06-11 DIAGNOSIS — G8929 Other chronic pain: Secondary | ICD-10-CM | POA: Diagnosis present

## 2017-06-11 DIAGNOSIS — Z90711 Acquired absence of uterus with remaining cervical stump: Secondary | ICD-10-CM

## 2017-06-11 DIAGNOSIS — B9620 Unspecified Escherichia coli [E. coli] as the cause of diseases classified elsewhere: Secondary | ICD-10-CM | POA: Diagnosis present

## 2017-06-11 DIAGNOSIS — Z91048 Other nonmedicinal substance allergy status: Secondary | ICD-10-CM

## 2017-06-11 DIAGNOSIS — K7460 Unspecified cirrhosis of liver: Secondary | ICD-10-CM | POA: Diagnosis present

## 2017-06-11 DIAGNOSIS — K529 Noninfective gastroenteritis and colitis, unspecified: Secondary | ICD-10-CM | POA: Diagnosis present

## 2017-06-11 DIAGNOSIS — I129 Hypertensive chronic kidney disease with stage 1 through stage 4 chronic kidney disease, or unspecified chronic kidney disease: Secondary | ICD-10-CM | POA: Diagnosis present

## 2017-06-11 DIAGNOSIS — K7581 Nonalcoholic steatohepatitis (NASH): Secondary | ICD-10-CM | POA: Diagnosis present

## 2017-06-11 DIAGNOSIS — G4730 Sleep apnea, unspecified: Secondary | ICD-10-CM | POA: Diagnosis present

## 2017-06-11 DIAGNOSIS — Z825 Family history of asthma and other chronic lower respiratory diseases: Secondary | ICD-10-CM | POA: Diagnosis not present

## 2017-06-11 LAB — COMPREHENSIVE METABOLIC PANEL
ALT: 37 U/L (ref 14–54)
AST: 57 U/L — ABNORMAL HIGH (ref 15–41)
Albumin: 3.6 g/dL (ref 3.5–5.0)
Alkaline Phosphatase: 102 U/L (ref 38–126)
Anion gap: 7 (ref 5–15)
BUN: 6 mg/dL (ref 6–20)
CO2: 32 mmol/L (ref 22–32)
Calcium: 9.1 mg/dL (ref 8.9–10.3)
Chloride: 100 mmol/L — ABNORMAL LOW (ref 101–111)
Creatinine, Ser: 0.57 mg/dL (ref 0.44–1.00)
GFR calc Af Amer: >60 mL/min (ref >60)
GFR calc non Af Amer: >60 mL/min (ref >60)
Glucose, Bld: 302 mg/dL — ABNORMAL HIGH (ref 65–99)
Potassium: 3.8 mmol/L (ref 3.5–5.1)
Sodium: 139 mmol/L (ref 135–145)
Total Bilirubin: 1.4 mg/dL — ABNORMAL HIGH (ref 0.3–1.2)
Total Protein: 6.6 g/dL (ref 6.5–8.1)

## 2017-06-11 LAB — CBG MONITORING, ED: Glucose-Capillary: 295 mg/dL — ABNORMAL HIGH (ref 65–99)

## 2017-06-11 LAB — URINALYSIS, ROUTINE W REFLEX MICROSCOPIC
Bilirubin Urine: NEGATIVE
Glucose, UA: >=500 mg/dL — AB
Hgb urine dipstick: NEGATIVE
Ketones, ur: NEGATIVE mg/dL
Leukocytes, UA: NEGATIVE
Nitrite: POSITIVE — AB
Protein, ur: NEGATIVE mg/dL
Specific Gravity, Urine: 1.012 (ref 1.005–1.030)
pH: 5 (ref 5.0–8.0)

## 2017-06-11 LAB — CBC WITH DIFFERENTIAL/PLATELET
Basophils Absolute: 0 10*3/uL (ref 0.0–0.1)
Basophils Relative: 0 %
Eosinophils Absolute: 0 10*3/uL (ref 0.0–0.7)
Eosinophils Relative: 0 %
HCT: 40.4 % (ref 36.0–46.0)
Hemoglobin: 13.5 g/dL (ref 12.0–15.0)
Lymphocytes Relative: 29 %
Lymphs Abs: 0.7 10*3/uL (ref 0.7–4.0)
MCH: 33.4 pg (ref 26.0–34.0)
MCHC: 33.4 g/dL (ref 30.0–36.0)
MCV: 100 fL (ref 78.0–100.0)
Monocytes Absolute: 0.2 10*3/uL (ref 0.1–1.0)
Monocytes Relative: 6 %
Neutro Abs: 1.6 10*3/uL — ABNORMAL LOW (ref 1.7–7.7)
Neutrophils Relative %: 65 %
Platelets: 58 10*3/uL — ABNORMAL LOW (ref 150–400)
RBC: 4.04 MIL/uL (ref 3.87–5.11)
RDW: 13.6 % (ref 11.5–15.5)
WBC: 2.5 10*3/uL — ABNORMAL LOW (ref 4.0–10.5)

## 2017-06-11 LAB — GLUCOSE, CAPILLARY: Glucose-Capillary: 255 mg/dL — ABNORMAL HIGH (ref 65–99)

## 2017-06-11 LAB — AMMONIA: Ammonia: 42 umol/L — ABNORMAL HIGH (ref 9–35)

## 2017-06-11 LAB — BRAIN NATRIURETIC PEPTIDE: B Natriuretic Peptide: 15 pg/mL (ref 0.0–100.0)

## 2017-06-11 MED ORDER — IPRATROPIUM-ALBUTEROL 0.5-2.5 (3) MG/3ML IN SOLN: 3.0000 mL | Freq: Four times a day (QID) | RESPIRATORY_TRACT | Status: DC | PRN

## 2017-06-11 MED ORDER — LACTULOSE 10 GM/15ML PO SOLN
20.0000 g | Freq: Three times a day (TID) | ORAL | Status: DC
  Administered 2017-06-11 – 2017-06-13 (×5): 20 g via ORAL
  Filled 2017-06-11 (×5): qty 30

## 2017-06-11 MED ORDER — SPIRONOLACTONE 100 MG PO TABS
100.0000 mg | ORAL_TABLET | Freq: Every day | ORAL | Status: DC
  Administered 2017-06-12 – 2017-06-13 (×2): 100 mg via ORAL
  Filled 2017-06-11 (×2): qty 1

## 2017-06-11 MED ORDER — SODIUM CHLORIDE 0.9 % IV SOLN
INTRAVENOUS | Status: DC
  Administered 2017-06-11 – 2017-06-13 (×2): via INTRAVENOUS

## 2017-06-11 MED ORDER — KETOROLAC TROMETHAMINE 15 MG/ML IJ SOLN
15.0000 mg | Freq: Once | INTRAMUSCULAR | Status: AC
Start: 2017-06-11 — End: 2017-06-11
  Administered 2017-06-11: 15 mg via INTRAVENOUS
  Filled 2017-06-11: qty 1

## 2017-06-11 MED ORDER — ONDANSETRON HCL 4 MG/2ML IJ SOLN
4.0000 mg | Freq: Four times a day (QID) | INTRAMUSCULAR | Status: DC | PRN
  Administered 2017-06-11 – 2017-06-13 (×2): 4 mg via INTRAVENOUS
  Filled 2017-06-11 (×2): qty 2

## 2017-06-11 MED ORDER — CITALOPRAM HYDROBROMIDE 20 MG PO TABS
40.0000 mg | ORAL_TABLET | Freq: Every day | ORAL | Status: DC
  Administered 2017-06-12 – 2017-06-13 (×2): 40 mg via ORAL
  Filled 2017-06-11 (×2): qty 2
  Filled 2017-06-11 (×2): qty 1

## 2017-06-11 MED ORDER — ALBUTEROL SULFATE (2.5 MG/3ML) 0.083% IN NEBU: 3.0000 mL | INHALATION_SOLUTION | Freq: Four times a day (QID) | RESPIRATORY_TRACT | Status: DC | PRN

## 2017-06-11 MED ORDER — IOPAMIDOL (ISOVUE-300) INJECTION 61%
100.0000 mL | Freq: Once | INTRAVENOUS | Status: AC | PRN
  Administered 2017-06-11: 100 mL via INTRAVENOUS

## 2017-06-11 MED ORDER — RIFAXIMIN 550 MG PO TABS
550.0000 mg | ORAL_TABLET | Freq: Every day | ORAL | Status: DC
  Administered 2017-06-11 – 2017-06-13 (×3): 550 mg via ORAL
  Filled 2017-06-11 (×3): qty 1

## 2017-06-11 MED ORDER — PANTOPRAZOLE SODIUM 40 MG PO TBEC
40.0000 mg | DELAYED_RELEASE_TABLET | Freq: Two times a day (BID) | ORAL | Status: DC
  Administered 2017-06-12 – 2017-06-13 (×3): 40 mg via ORAL
  Filled 2017-06-11 (×3): qty 1

## 2017-06-11 MED ORDER — INSULIN ASPART 100 UNIT/ML ~~LOC~~ SOLN
0.0000 [IU] | Freq: Three times a day (TID) | SUBCUTANEOUS | Status: DC
  Administered 2017-06-12 (×3): 2 [IU] via SUBCUTANEOUS
  Administered 2017-06-13: 5 [IU] via SUBCUTANEOUS
  Administered 2017-06-13: 2 [IU] via SUBCUTANEOUS

## 2017-06-11 MED ORDER — METRONIDAZOLE IN NACL 5-0.79 MG/ML-% IV SOLN
500.0000 mg | Freq: Three times a day (TID) | INTRAVENOUS | Status: DC
  Administered 2017-06-11 – 2017-06-13 (×6): 500 mg via INTRAVENOUS
  Filled 2017-06-11 (×6): qty 100

## 2017-06-11 MED ORDER — SODIUM CHLORIDE 0.9 % IV BOLUS
1000.0000 mL | Freq: Once | INTRAVENOUS | Status: AC
  Administered 2017-06-11: 1000 mL via INTRAVENOUS

## 2017-06-11 MED ORDER — SIMETHICONE 40 MG/0.6ML PO SUSP: 40.0000 mg | Freq: Once | ORAL | Status: DC

## 2017-06-11 MED ORDER — CIPROFLOXACIN IN D5W 400 MG/200ML IV SOLN
400.0000 mg | Freq: Two times a day (BID) | INTRAVENOUS | Status: DC
  Administered 2017-06-11 – 2017-06-13 (×4): 400 mg via INTRAVENOUS
  Filled 2017-06-11 (×4): qty 200

## 2017-06-11 MED ORDER — OXYCODONE-ACETAMINOPHEN 5-325 MG PO TABS
1.0000 | ORAL_TABLET | Freq: Once | ORAL | Status: AC
  Administered 2017-06-11: 1 via ORAL
  Filled 2017-06-11: qty 1

## 2017-06-11 MED ORDER — ONDANSETRON HCL 4 MG PO TABS
4.0000 mg | ORAL_TABLET | Freq: Four times a day (QID) | ORAL | Status: DC | PRN
  Administered 2017-06-12: 4 mg via ORAL
  Filled 2017-06-11: qty 1

## 2017-06-11 MED ORDER — INSULIN DETEMIR 100 UNIT/ML ~~LOC~~ SOLN
25.0000 [IU] | Freq: Two times a day (BID) | SUBCUTANEOUS | Status: DC
  Administered 2017-06-11 – 2017-06-13 (×4): 25 [IU] via SUBCUTANEOUS
  Filled 2017-06-11 (×6): qty 0.25

## 2017-06-11 MED ORDER — SODIUM CHLORIDE 0.9 % IV SOLN
1.0000 g | Freq: Once | INTRAVENOUS | Status: AC
  Administered 2017-06-11: 1 g via INTRAVENOUS
  Filled 2017-06-11: qty 10

## 2017-06-11 MED ORDER — SIMETHICONE 80 MG PO CHEW
80.0000 mg | CHEWABLE_TABLET | Freq: Four times a day (QID) | ORAL | Status: DC
  Administered 2017-06-12 – 2017-06-13 (×6): 80 mg via ORAL
  Filled 2017-06-11 (×6): qty 1

## 2017-06-11 MED ORDER — SIMETHICONE 80 MG PO CHEW
80.0000 mg | CHEWABLE_TABLET | Freq: Once | ORAL | Status: AC
  Administered 2017-06-11: 80 mg via ORAL
  Filled 2017-06-11: qty 1

## 2017-06-11 NOTE — ED Provider Notes (Signed)
[facility] EMERGENCY DEPARTMENT Provider Note   CSN: [id number] Arrival date & time: 06/11/17  1314     History   Chief Complaint Chief Complaint  Patient presents with  . Altered Mental Status    HPI Lori Mckenzie is a 52 y.o. female.  The patient presents with 2 days of nausea vomiting lower abdominal pain.  Her husband states that she is not thinking as clearly as normal.  And he is concerned that her ammonia level is elevated.  The history is provided by the patient and a relative. No language interpreter was used.  Emesis   This is a new problem. The current episode started more than 2 days ago. The problem occurs 5 to 10 times per day. The problem has not changed since onset.The emesis has an appearance of stomach contents. There has been no fever. Pertinent negatives include no abdominal pain, no chills, no cough, no diarrhea and no headaches. Risk factors: Unknown.    Past Medical History:  Diagnosis Date  . Abdominal abscess 08/25/2014  . Acid reflux 08/10/2010  . Acute cervical myofascial strain 02/09/2016  . Acute postoperative pain 08/09/2016  . Anxiety   . Ascites   . Asthma   . Back pain   . Bile leak, postoperative 07/17/2014  . Brittle bone disease   . Cancer ([place])    Uteriine  ca 29yr ago partial hysterectomy  . Cervical disc disease   . Chronic kidney disease   . Collagen vascular disease ([place]    RA  3-4 yrs ago  . COPD (chronic obstructive pulmonary disease) ([place]   . Diabetes mellitus without complication ([place]   . GERD (gastroesophageal reflux disease)   . Hypertension   . Hypothyroidism   . Left upper quadrant pain 01/09/2014  . Major depressive disorder with single episode 12/05/2011  . Major depressive disorder, single episode 12/05/2011  . Migraines   . NASH (nonalcoholic steatohepatitis)   . Respiratory infection    2/17  . Shock ([place] 09/18/2014  . Sleep apnea   . Sleep apnea   . Syncope 11/16/2014  . Thyroid disease   . TIA (transient  ischemic attack)     Patient Active Problem List   Diagnosis Date Noted  . Neurogenic pain 02/13/2017  . Chest pain 12/20/2016  . Chronic feet pain (Secondary source of pain) (Bilateral) (R>L) 10/26/2016  . Acute hepatic encephalopathy 09/23/2016  . Low back pain due to L1-2 disc extrusion (caudal) (Left) 07/11/2016  . Abdominal pain 06/28/2016  . Constipation 06/20/2016  . Hyperglycemia 06/20/2016  . Nausea without vomiting 06/06/2016  . Chronic lower extremity pain (Bilateral) 05/19/2016  . Spinal stenosis, thoracic region (T10-11) 05/19/2016  . Diabetes mellitus, insulin dependent (IDDM), uncontrolled ([place] 04/12/2016  . Chronic sacroiliac joint pain (Bilateral) (L>R) 03/23/2016  . Chronic upper extremity pain (Left) 03/10/2016  . Chronic radicular cervical pain (L) 03/10/2016  . Osteoarthritis 02/24/2016  . Allodynia 02/09/2016  . Chronic pain syndrome 01/07/2016  . Opiate withdrawal ([place] 12/22/2015  . Elevated liver enzymes 09/28/2015  . Cirrhosis of liver without ascites ([place] 09/24/2015  . Cirrhosis ([place] 09/24/2015  . Altered mental status 07/24/2015  . Uncontrolled diabetes mellitus ([place] 07/24/2015  . Radicular pain of thoracic region 04/06/2015  . Hepatic encephalopathy ([place] 03/11/2015  . Elevated sedimentation rate 03/05/2015  . Elevated C-reactive protein (CRP) 03/05/2015  . Lumbar facet syndrome (Bilateral) (R>L) 03/05/2015  . Cervical spondylosis 03/05/2015  . Lumbar spondylosis 03/05/2015  . Encounter for chronic pain management  03/05/2015  . Chronic shoulder pain (Bilateral) (R>L) 03/05/2015  . Chronic carpal tunnel syndrome (Bilateral) 03/05/2015  . Chronic hip pain (Bilateral) (L>R) 03/05/2015  . Chronic upper back pain (Bilateral) (L>R) 03/05/2015  . Osteoporosis, idiopathic 03/05/2015  . Abnormal MRI, lumbar spine (02/03/2015) 03/05/2015  . Thoracic radiculitis 02/05/2015  . Vitamin D deficiency 01/13/2015  . B12 deficiency 01/13/2015  . Folate  deficiency 01/13/2015  . Subacute lumbar radiculopathy (left side) (S1 dermatome) 12/10/2014  . Drowsiness 11/16/2014  . Episode of syncope 11/16/2014  . Somnolence 11/16/2014  . Opiate use (75 MME/Day) 10/28/2014  . Long term prescription opiate use 10/28/2014  . Long term current use of opiate analgesic 10/28/2014  . Encounter for therapeutic drug level monitoring 10/28/2014  . Chronic epigastric abdominal pain 10/28/2014  . Chronic low back pain (Primary Source of Pain) (Bilateral) (R>L) 10/28/2014  . Chronic neck pain [facility] source of pain) (Bilateral) (R>L) 10/28/2014  . Ascites 09/05/2014  . NASH (nonalcoholic steatohepatitis) 09/05/2014  . Hypokalemia 09/05/2014  . Dysthymia 08/05/2014  . Other social stressor 08/05/2014  . Steatohepatitis 07/15/2014  . Type 2 diabetes mellitus ([place]) 07/12/2014  . COPD with acute exacerbation ([place]) 07/12/2014  . GERD (gastroesophageal reflux disease) 07/12/2014  . OSA on CPAP 07/12/2014  . Anxiety 07/12/2014  . 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 Lumbar and sacral osteoarthritis 03/26/2014  . Myofascial pain 03/26/2014  . Lumbar spinal stenosis 03/26/2014  . Lumbosacral spondylosis without myelopathy 03/26/2014  . 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 Lumbar central spinal stenosis (L1-2 and L4-5) 03/26/2014  . Spondylosis of lumbar region without myelopathy or radiculopathy 03/26/2014  . Breath shortness 01/09/2014  . Diastolic dysfunction with chronic heart failure ([place]) 08/06/2013  . Airway hyperreactivity 08/06/2013  . Essential (primary) hypertension 08/06/2013  . Asthma 08/06/2013  . Clinical depression 12/05/2011    Past Surgical History:  Procedure Laterality Date  . ABDOMINAL HYSTERECTOMY    . CHOLECYSTECTOMY N/A 07/15/2014   Procedure: LAPAROSCOPIC CHOLECYSTECTOMY with liver biopsy ;  Surgeon: Sherri Rad, MD;  Location: [facility] ORS;  Service: General;  Laterality: N/A;  . COLONOSCOPY WITH PROPOFOL N/A  06/23/2014   Procedure: COLONOSCOPY WITH PROPOFOL;  Surgeon: Lollie Sails, MD;  Location: [facility] ENDOSCOPY;  Service: Endoscopy;  Laterality: N/A;  . ERCP N/A 07/16/2014   Procedure: ENDOSCOPIC RETROGRADE CHOLANGIOPANCREATOGRAPHY (ERCP);  Surgeon: Clarene Essex, MD;  Location: [facility] ENDOSCOPY;  Service: Endoscopy;  Laterality: N/A;  . ERCP N/A 10/03/2014   Procedure: ENDOSCOPIC RETROGRADE CHOLANGIOPANCREATOGRAPHY (ERCP);  Surgeon: Hulen Luster, MD;  Location: [facility] ENDOSCOPY;  Service: Gastroenterology;  Laterality: N/A;  . ESOPHAGOGASTRODUODENOSCOPY N/A 06/23/2014   Procedure: ESOPHAGOGASTRODUODENOSCOPY (EGD);  Surgeon: Lollie Sails, MD;  Location: [facility] ENDOSCOPY;  Service: Endoscopy;  Laterality: N/A;  . ESOPHAGOGASTRODUODENOSCOPY N/A 06/30/2016   Procedure: ESOPHAGOGASTRODUODENOSCOPY (EGD);  Surgeon: Jonathon Bellows, MD;  Location[facility] ENDOSCOPY;  Service: Endoscopy;  Laterality: N/A;  . ESOPHAGOGASTRODUODENOSCOPY (EGD) WITH PROPOFOL N/A 12/01/2015   Procedure: ESOPHAGOGASTRODUODENOSCOPY (EGD) WITH PROPOFOL;  Surgeon: Lollie Sails, MD;  Location: [facility] ENDOSCOPY;  Service: Endoscopy;  Laterality: N/A;  . TUBAL LIGATION    . WISDOM TOOTH EXTRACTION       OB History   None      Home Medications    Prior to Admission medications   Medication Sig Start Date End Date Taking? Authorizing Provider  albuterol (PROVENTIL HFA;VENTOLIN HFA) 108 (90 Base) MCG/ACT inhaler Inhale 2 puffs into the lungs every 6 (six) hours as needed for wheezing or  shortness of breath.   Yes [provider]  ALPRAZolam Duanne Moron) 1 MG tablet Take 1 mg by mouth 3 (three) times daily.   Yes [provider]  cholecalciferol (VITAMIN D) 1000 units tablet Take 2,000 Units by mouth daily.   Yes [provider]  citalopram (CELEXA) 40 MG tablet Take 40 mg by mouth daily.  07/24/15  Yes [provider]  diclofenac sodium (VOLTAREN) 1 % GEL Apply 4 g topically every 8 (eight) hours as  needed. Patient taking differently: Apply 4 g topically every 8 (eight) hours as needed (FOR PAIN).  12/06/16 06/11/17 Yes Milinda Pointer, MD  fluticasone (FLONASE) 50 MCG/ACT nasal spray Place 1-2 sprays into both nostrils daily.    Yes [provider]  Folate-B12-Intrinsic Factor (INTRINSI B12-FOLATE) 622-633-35 MCG-MCG-MG TABS Take 1 tablet by mouth daily.     [provider]  furosemide (LASIX) 40 MG tablet Take 1 tablet (40 mg total) by mouth 2 (two) times daily. Patient taking differently: Take 40 mg by mouth 2 (two) times daily as needed for fluid.  10/26/16   Nicholes Mango, MD  gabapentin (NEURONTIN) 300 MG capsule Take 900 mg by mouth 4 (four) times daily.     [provider]  ibuprofen (ADVIL,MOTRIN) 600 MG tablet Take 1 tablet (600 mg total) by mouth every 8 (eight) hours as needed. 04/29/17   Darel Hong, MD  insulin detemir (LEVEMIR) 100 UNIT/ML injection Inject 0.25 mLs (25 Units total) into the skin 2 (two) times daily. Patient taking differently: Inject 86 Units into the skin 2 (two) times daily.  10/26/16   Gouru, Illene Silver, MD  ipratropium-albuterol (DUONEB) 0.5-2.5 (3) MG/3ML SOLN Take 3 mLs by nebulization every 4 (four) hours as needed. For shortness of breath/wheezing    [provider]  lactulose (CHRONULAC) 10 GM/15ML solution Take 30 mLs (20 g total) by mouth 3 (three) times daily. 10/26/16   Gouru, Illene Silver, MD  lidocaine (LIDODERM) 5 % Place 1 patch onto the skin daily. Remove & Discard patch within 12 hours or as directed by MD Patient not taking: Reported on 04/29/2017 12/16/16   Gregor Hams, MD  nystatin (MYCOSTATIN/NYSTOP) powder Apply 1 g topically 3 (three) times daily as needed. For irritation 10/26/16   Gouru, Illene Silver, MD  oxyCODONE (ROXICODONE) 5 MG immediate release tablet Take 1 tablet (5 mg total) by mouth every 6 (six) hours as needed for severe pain. 03/13/17   Etta Quill, NP  pantoprazole (PROTONIX) 40 MG tablet Take 40 mg  by mouth 2 (two) times daily before a meal.    [provider]  potassium chloride SA (K-DUR,KLOR-CON) 20 MEQ tablet Take 10-20 mEq by mouth daily as needed. For high blood pressure    [provider]  promethazine (PHENERGAN) 12.5 MG tablet Take 2 tablets (25 mg total) by mouth every 6 (six) hours as needed for nausea or vomiting. 09/25/16   Dustin Flock, MD  rifaximin (XIFAXAN) 550 MG TABS tablet Take 550 mg by mouth 2 (two) times daily.     [provider]  rizatriptan (MAXALT) 10 MG tablet Take 10 mg by mouth as needed for migraine.     [provider]  spironolactone (ALDACTONE) 100 MG tablet Take 100 mg by mouth daily.     [provider]  vitamin B-12 (CYANOCOBALAMIN) 1000 MCG tablet Take 2,000 mcg by mouth daily.    [provider]  SUMAtriptan (IMITREX) 50 MG tablet Take 50 mg by mouth every 2 (two) hours  as needed. For migraines  03/17/11  [provider]    Family History Family History  Problem Relation Age of Onset  . Lung cancer Mother   . Ulcers Father   . Emphysema Father   . Heart disease Sister   . Ulcers Sister   . Heart disease Brother     Social History Social History   Tobacco Use  . Smoking status: Never Smoker  . Smokeless tobacco: Never Used  Substance Use Topics  . Alcohol use: No    Comment: occ  . Drug use: No     Allergies   Nsaids; Tape; and Vicodin [hydrocodone-acetaminophen]   Review of Systems Review of Systems  Constitutional: Negative for appetite change, chills and fatigue.  HENT: Negative for congestion, ear discharge and sinus pressure.   Eyes: Negative for discharge.  Respiratory: Negative for cough.   Cardiovascular: Negative for chest pain.  Gastrointestinal: Positive for vomiting. Negative for abdominal pain and diarrhea.  Genitourinary: Negative for frequency and hematuria.  Musculoskeletal: Negative for back pain.  Skin: Negative for rash.  Neurological:  Negative for seizures and headaches.  Psychiatric/Behavioral: Negative for hallucinations.     Physical Exam Updated Vital Signs BP 112/68   Pulse 81   Temp 97.7 F (36.5 C) (Oral)   Resp 20   Ht 5' 4"  (1.626 m)   Wt 127 kg (280 lb)   SpO2 95%   BMI 48.06 kg/m   Physical Exam  Constitutional: She is oriented to person, place, and time. She appears well-developed.  HENT:  Head: Normocephalic.  Eyes: Conjunctivae and EOM are normal. No scleral icterus.  Neck: Neck supple. No thyromegaly present.  Cardiovascular: Normal rate and regular rhythm. Exam reveals no gallop and no friction rub.  No murmur heard. Pulmonary/Chest: No stridor. She has no wheezes. She has no rales. She exhibits no tenderness.  Abdominal: She exhibits no distension. There is tenderness. There is no rebound.  Musculoskeletal: Normal range of motion. She exhibits no edema.  Lymphadenopathy:    She has no cervical adenopathy.  Neurological: She is oriented to person, place, and time. She exhibits normal muscle tone. Coordination normal.  Skin: No rash noted. No erythema.  Psychiatric: She has a normal mood and affect. Her behavior is normal.     ED Treatments / Results  Labs (all labs ordered are listed, but only abnormal results are displayed) Labs Reviewed  COMPREHENSIVE METABOLIC PANEL - Abnormal; Notable for the following components:      Result Value   Chloride 100 (*)    Glucose, Bld 302 (*)    AST 57 (*)    Total Bilirubin 1.4 (*)    All other components within normal limits  CBC WITH DIFFERENTIAL/PLATELET - Abnormal; Notable for the following components:   WBC 2.5 (*)    Platelets 58 (*)    Neutro Abs 1.6 (*)    All other components within normal limits  URINALYSIS, ROUTINE W REFLEX MICROSCOPIC - Abnormal; Notable for the following components:   Glucose, UA >=500 (*)    Nitrite POSITIVE (*)    Bacteria, UA RARE (*)    All other components within normal limits  AMMONIA - Abnormal;  Notable for the following components:   Ammonia 42 (*)    All other components within normal limits  CBG MONITORING, ED - Abnormal; Notable for the following components:   Glucose-Capillary 295 (*)    All other components within normal limits  URINE CULTURE  BRAIN NATRIURETIC PEPTIDE  EKG None  Radiology Ct Abdomen Pelvis W Contrast  Result Date: 06/11/2017 CLINICAL DATA:  Acute generalized abdominal pain, back pain, nausea and vomiting for 2 days, RIGHT ear and jaw pain, history uterine cancer, collagen vascular disease, COPD, type II diabetes mellitus, hypertension, NASH EXAM: CT ABDOMEN AND PELVIS WITH CONTRAST TECHNIQUE: Multidetector CT imaging of the abdomen and pelvis was performed using the standard protocol following bolus administration of intravenous contrast. Sagittal and coronal MPR images reconstructed from axial data set. CONTRAST:  153m ISOVUE-300 IOPAMIDOL (ISOVUE-300) INJECTION 61% IV. Dilute oral contrast. COMPARISON:  02/18/2017 FINDINGS: Lower chest: Dependent bibasilar atelectasis Hepatobiliary: Gallbladder surgically absent. Hepatic margins appear slightly nodular likely representing cirrhosis. Cyst lateral segment RIGHT lobe liver adjacent to falciform fissure 2.2 x 1.7 cm previously 1.9 x 1.7 cm. Additional tiny cysts within the liver appear stable. No new focal hepatic mass lesion. Pancreas: Normal appearance Spleen: Splenomegaly, spleen measuring 13.7 x 6.8 x 20.5 cm (volume = 1000 cm^3). No focal lesion. Adrenals/Urinary Tract: Adrenal glands, kidneys, ureters, and bladder normal appearance Stomach/Bowel: Normal appendix. Stranding is seen in the fat adjacent to the distal appendix also present on previous exam, with appendix otherwise unremarkable. Stool throughout colon. Questionable wall thickening of sigmoid colon versus artifact from underdistention. Stomach unremarkable. Remaining bowel loops normal appearance. Vascular/Lymphatic: Scattered pelvic phleboliths.  Minimal atherosclerotic calcification aorta. Aorta normal caliber. No adenopathy. Reproductive: Uterus surgically absent. Probable normal sized ovaries. Other: No free intraperitoneal air or fluid. Diffuse stranding of mesenteric and retroperitoneal tissue planes slightly increased from previous exam, likely related to cirrhosis and portal hypertension. No hernia. Musculoskeletal: Bones demineralized. IMPRESSION: Cirrhotic appearing liver with splenomegaly. Hepatic cysts, largest 2.2 x 1.7 cm minimally increased. Scattered stranding of retroperitoneal and mesenteric tissue planes, question related to portal hypertension and cirrhosis. Questionable wall thickening of the sigmoid colon versus artifact from underdistention. Electronically Signed   By: MLavonia DanaM.D.   On: 06/11/2017 17:47   Dg Chest Portable 1 View  Result Date: 06/11/2017 CLINICAL DATA:  Weakness EXAM: PORTABLE CHEST 1 VIEW COMPARISON:  04/29/2017 chest radiograph. FINDINGS: Stable cardiomediastinal silhouette with top-normal heart size. No pneumothorax. No pleural effusion. Lungs appear clear, with no acute consolidative airspace disease and no pulmonary edema. IMPRESSION: No active disease. Electronically Signed   By: JIlona SorrelM.D.   On: 06/11/2017 15:51    Procedures Procedures (including critical care time)  Medications Ordered in ED Medications  sodium chloride 0.9 % bolus 1,000 mL (0 mLs Intravenous Stopped 06/11/17 1646)  cefTRIAXone (ROCEPHIN) 1 g in sodium chloride 0.9 % 100 mL IVPB (0 g Intravenous Stopped 06/11/17 1735)  iopamidol (ISOVUE-300) 61 % injection 100 mL (100 mLs Intravenous Contrast Given 06/11/17 1724)  oxyCODONE-acetaminophen (PERCOCET/ROXICET) 5-325 MG per tablet 1 tablet (1 tablet Oral Given 06/11/17 1802)   Labs reviewed.  Initial Impression / Assessment and Plan / ED Course  I have reviewed the triage vital signs and the nursing notes.  Pertinent labs & imaging results that were available during my care  of the patient were reviewed by me and considered in my medical decision making (see chart for details).   Labs reviewed.  Patient has a sugar of 300 and urinalysis to suggest UTI.  Patient's white blood count is low but this is not unusual for her.  CT scan of the belly shows possible swollen sigmoid colon.  Patient will be admitted to medicine and have a urine culture done start on antibiotics for urinary tract infection and possible colitis.  Final Clinical Impressions(s) / ED Diagnoses   Final diagnoses:  Lower urinary tract infectious disease    ED Discharge Orders    None       Milton Ferguson, MD 06/11/17 [phone]

## 2017-06-11 NOTE — H&P (Signed)
TRH H&P    Patient Demographics:    Lori Mckenzie, is a 52 y.o. female  MRN: [id number]  DOB - [birth year]  Admit Date - 06/11/2017  Referring MD/NP/PA:Dr. Roderic Palau  Outpatient Primary MD for the patient is Ashkin, Neldon Labella, MD  Patient coming from: Home  Chief complaint- Abdominal pain   HPI:    Lori Mckenzie  is a 52 y.o. female, with history of GERD, asthma, diabetes mellitus, [name],, chronic back pain, cirrhosis of liver came to hospital with abdominal pain.  Patient states that pain has been minimal for 1 week and she also complains of nausea and vomiting.  She denies diarrhea. She denies fever, denies dysuria No chest pain, complains of intermittent shortness of breath.  Patient has a history of COPD. In the ED CT scan of the abdomen pelvis was done which showed questionable wall thickening of the sigmoid colon versus artifact from under distention Also patient had an abnormal UA with positive nitrite. Denies previous history of  seizures.   Review of systems:     All other systems reviewed and are negative.   With Past History of the following :    Past Medical History:  Diagnosis Date  . Abdominal abscess 08/25/2014  . Acid reflux 08/10/2010  . Acute cervical myofascial strain 02/09/2016  . Acute postoperative pain 08/09/2016  . Anxiety   . Ascites   . Asthma   . Back pain   . Bile leak, postoperative 07/17/2014  . Brittle bone disease   . Cancer ([place])    Uteriine  ca 60yr ago partial hysterectomy  . Cervical disc disease   . Chronic kidney disease   . [place]    RA  3-4 yrs ago  . [place] obstructive [place] disease) [place]   . Diabetes mellitus without complication ([place]   . GERD (gastroesophageal reflux disease)   . Hypertension   . Hypothyroidism   . Left upper quadrant pain 01/09/2014  . Major depressive disorder with single episode 12/05/2011  . Major  depressive disorder, single episode 12/05/2011  . Migraines   . NASH (nonalcoholic steatohepatitis)   . Respiratory infection    2/17  . [place] 09/18/2014  . Sleep apnea   . Sleep apnea   . Syncope 11/16/2014  . Thyroid disease   . TIA (transient ischemic attack)       Past Surgical History:  Procedure Laterality Date  . ABDOMINAL HYSTERECTOMY    . CHOLECYSTECTOMY N/A 07/15/2014   Procedure: LAPAROSCOPIC CHOLECYSTECTOMY with liver biopsy ;  Surgeon: MSherri Rad MD;  Location: [facility] ORS;  Service: General;  Laterality: N/A;  . COLONOSCOPY WITH PROPOFOL N/A 06/23/2014   Procedure: COLONOSCOPY WITH PROPOFOL;  Surgeon: MLollie Sails MD;  [facility];  Service: Endoscopy;  Laterality: N/A;  . ERCP N/A 07/16/2014   Procedure: ENDOSCOPIC RETROGRADE CHOLANGIOPANCREATOGRAPHY (ERCP);  Surgeon: MClarene Essex MD;  [facility];  Service: Endoscopy;  Laterality: N/A;  . ERCP N/A 10/03/2014   Procedure: ENDOSCOPIC RETROGRADE CHOLANGIOPANCREATOGRAPHY (ERCP);  Surgeon: PLupita Dawn  Candace Cruise, MD;  Location: [place];  Service: Gastroenterology;  Laterality: N/A;  . ESOPHAGOGASTRODUODENOSCOPY N/A 06/23/2014   Procedure: ESOPHAGOGASTRODUODENOSCOPY (EGD);  Surgeon: Lollie Sails, MD;  [facility];  Service: Endoscopy;  Laterality: N/A;  . ESOPHAGOGASTRODUODENOSCOPY N/A 06/30/2016   Procedure: ESOPHAGOGASTRODUODENOSCOPY (EGD);  Surgeon: Jonathon Bellows, MD;  Location: [facility] ENDOSCOPY;  Service: Endoscopy;  Laterality: N/A;  . ESOPHAGOGASTRODUODENOSCOPY (EGD) WITH PROPOFOL N/A 12/01/2015   Procedure: ESOPHAGOGASTRODUODENOSCOPY (EGD) WITH PROPOFOL;  Surgeon: Lollie Sails, MD;  Location: [facility] ENDOSCOPY;  Service: Endoscopy;  Laterality: N/A;  . TUBAL LIGATION    . WISDOM TOOTH EXTRACTION        Social History:      Social History   Tobacco Use  . Smoking status: Never Smoker  . Smokeless tobacco: Never Used  Substance Use Topics  . Alcohol use: No    Comment: occ        Family History :     Family History  Problem Relation Age of Onset  . Lung cancer Mother   . Ulcers Father   . Emphysema Father   . Heart disease Sister   . Ulcers Sister   . Heart disease Brother       Home Medications:   Prior to Admission medications   Medication Sig Start Date End Date Taking? Authorizing Provider  albuterol (PROVENTIL HFA;VENTOLIN HFA) 108 (90 Base) MCG/ACT inhaler Inhale 2 puffs into the lungs every 6 (six) hours as needed for wheezing or shortness of breath.   Yes [provider]  ALPRAZolam Duanne Moron) 1 MG tablet Take 1 mg by mouth 3 (three) times daily.   Yes [provider]  cholecalciferol (VITAMIN D) 1000 units tablet Take 2,000 Units by mouth daily.   Yes [provider]  citalopram (CELEXA) 40 MG tablet Take 40 mg by mouth daily.  07/24/15  Yes [provider]  diclofenac sodium (VOLTAREN) 1 % GEL Apply 4 g topically every 8 (eight) hours as needed. Patient taking differently: Apply 4 g topically every 8 (eight) hours as needed (FOR PAIN).  12/06/16 06/11/17 Yes Milinda Pointer, MD  fluticasone (FLONASE) 50 MCG/ACT nasal spray Place 1-2 sprays into both nostrils daily.    Yes [provider]  furosemide (LASIX) 40 MG tablet Take 1 tablet (40 mg total) by mouth 2 (two) times daily. Patient taking differently: Take 40 mg by mouth 2 (two) times daily as needed for fluid.  10/26/16  Yes Gouru, Illene Silver, MD  gabapentin (NEURONTIN) 300 MG capsule Take 600 mg by mouth 4 (four) times daily.    Yes [provider]  insulin detemir (LEVEMIR) 100 UNIT/ML injection Inject 0.25 mLs (25 Units total) into the skin 2 (two) times daily. Patient taking differently: Inject 96 Units into the skin 2 (two) times daily.  10/26/16  Yes Gouru, Aruna, MD  ipratropium-albuterol (DUONEB) 0.5-2.5 (3) MG/3ML SOLN Take 3 mLs by nebulization every 4 (four) hours as needed. For shortness of breath/wheezing   Yes [provider]  lactulose (CHRONULAC) 10 GM/15ML solution Take 30 mLs (20 g total) by mouth 3 (three) times daily. Patient taking differently: Take 40 g by mouth 4 (four) times daily.  10/26/16  Yes Gouru, Aruna, MD  lidocaine (LIDODERM) 5 % Place 1 patch onto the skin daily. Remove & Discard patch within 12 hours or as directed by MD Patient taking differently: Place 1 patch onto the skin daily as needed (FOR PAIN). Remove & Discard patch within 12 hours or as directed  by MD 12/16/16  Yes Gregor Hams, MD  nystatin (MYCOSTATIN/NYSTOP) powder Apply 1 g topically 3 (three) times daily as needed. For irritation 10/26/16  Yes Gouru, Aruna, MD  Oxycodone HCl 10 MG TABS Take 10 mg by mouth 6 (six) times daily.   Yes [provider]  pantoprazole (PROTONIX) 40 MG tablet Take 40 mg by mouth 2 (two) times daily before a meal.   Yes [provider]  potassium chloride SA (K-DUR,KLOR-CON) 20 MEQ tablet Take 20 mEq by mouth daily as needed. For high blood pressure   Yes [provider]  promethazine (PHENERGAN) 12.5 MG tablet Take 2 tablets (25 mg total) by mouth every 6 (six) hours as needed for nausea or vomiting. Patient taking differently: Take 12.5-25 mg by mouth every 6 (six) hours as needed for nausea or vomiting.  09/25/16  Yes Dustin Flock, MD  rifaximin (XIFAXAN) 550 MG TABS tablet Take 550 mg by mouth daily.    Yes [provider]  rizatriptan (MAXALT) 10 MG tablet Take 10 mg by mouth as needed for migraine.    Yes [provider]  spironolactone (ALDACTONE) 100 MG tablet Take 100 mg by mouth daily.    Yes [provider]  vitamin B-12 (CYANOCOBALAMIN) 1000 MCG tablet Take 2,000 mcg by mouth daily.   Yes [provider]  Folate-B12-Intrinsic Factor (INTRINSI B12-FOLATE) 209-470-96 MCG-MCG-MG TABS Take 1 tablet by mouth daily.     [provider]  ibuprofen (ADVIL,MOTRIN) 600 MG tablet Take 1 tablet (600 mg total) by mouth  every 8 (eight) hours as needed. Patient not taking: Reported on 06/11/2017 04/29/17   Darel Hong, MD  oxyCODONE (ROXICODONE) 5 MG immediate release tablet Take 1 tablet (5 mg total) by mouth every 6 (six) hours as needed for severe pain. Patient not taking: Reported on 06/11/2017 03/13/17   Etta Quill, NP  SUMAtriptan (IMITREX) 50 MG tablet Take 50 mg by mouth every 2 (two) hours as needed. For migraines  03/17/11  [provider]     Allergies:     Allergies  Allergen Reactions  . Nsaids Other (See Comments)    Liver problems Liver problems   . Tape Swelling  . Vicodin [Hydrocodone-Acetaminophen] Itching and Rash     Physical Exam:   Vitals  Blood pressure 115/82, pulse 88, temperature 97.7 F (36.5 C), temperature source Oral, resp. rate (!) 26, height 5' 4"  (1.626 m), weight 127 kg (280 lb), SpO2 99 %.  1.  General: Appears in no acute distress  2. Psychiatric:  Intact judgement and  insight, awake alert, oriented x 3.  3. Neurologic: No focal neurological deficits, all cranial nerves intact.Strength 5/5 all 4 extremities, sensation intact all 4 extremities, plantars down going.  4. Eyes :  anicteric sclerae, moist conjunctivae with no lid lag. PERRLA.  5. ENMT:  Oropharynx clear with moist mucous membranes and good dentition  6. Neck:  supple, no cervical lymphadenopathy appriciated, No thyromegaly  7. Respiratory : Normal respiratory effort, good air movement bilaterally,clear to  auscultation bilaterally  8. Cardiovascular : RRR, no gallops, rubs or murmurs, no leg edema  9. Gastrointestinal:  Positive bowel sounds, abdomen is distended, generalized tenderness to palpation ,no hepatosplenomegaly, no rigidity or guarding       10. Skin:  No cyanosis, normal texture and turgor, no rash, lesions or ulcers  11.Musculoskeletal:  Good muscle tone,  joints appear normal , no effusions,  normal range of motion    Data Review:  CBC Recent Labs    Lab 06/09/17 1419 06/11/17 1443  WBC 3.4* 2.5*  HGB 13.6 13.5  HCT 39.6 40.4  PLT 67* 58*  MCV 101.3* 100.0  MCH 34.8* 33.4  MCHC 34.3 33.4  RDW 14.9* 13.6  LYMPHSABS  --  0.7  MONOABS  --  0.2  EOSABS  --  0.0  BASOSABS  --  0.0   ------------------------------------------------------------------------------------------------------------------  Chemistries  Recent Labs  Lab 06/09/17 1329 06/11/17 1443  NA 138 139  K 4.0 3.8  CL 103 100*  CO2 27 32  GLUCOSE 165* 302*  BUN 9 6  CREATININE 0.60 0.57  CALCIUM 8.8* 9.1  AST 55* 57*  ALT 37 37  ALKPHOS 95 102  BILITOT 1.5* 1.4*   ------------------------------------------------------------------------------------------------------------------  ------------------------------------------------------------------------------------------------------------------ GFR: Estimated Creatinine Clearance: 107.3 mL/min (by C-G formula based on SCr of 0.57 mg/dL). Liver Function Tests: Recent Labs  Lab 06/09/17 1329 06/11/17 1443  AST 55* 57*  ALT 37 37  ALKPHOS 95 102  BILITOT 1.5* 1.4*  PROT 6.6 6.6  ALBUMIN 3.8 3.6   No results for input(s): LIPASE, AMYLASE in the last 168 hours. Recent Labs  Lab 06/09/17 1331 06/11/17 1443  AMMONIA 55* 42*    Recent Labs  Lab 06/09/17 1311 06/11/17 1409  GLUCAP 171* 295*   Lipid Profile: No results for input(s): CHOL, HDL, LDLCALC, TRIG, CHOLHDL, LDLDIRECT in the last 72 hours. Thyroid Function Tests: No results for input(s): TSH, T4TOTAL, FREET4, T3FREE, THYROIDAB in the last 72 hours. Anemia Panel: No results for input(s): VITAMINB12, FOLATE, FERRITIN, TIBC, IRON, RETICCTPCT in the last 72 hours.  --------------------------------------------------------------------------------------------------------------- Urine analysis:    Component Value Date/Time   COLORURINE YELLOW 06/11/2017 1406   APPEARANCEUR CLEAR 06/11/2017 1406   APPEARANCEUR Hazy 03/27/2014 1105    LABSPEC 1.012 06/11/2017 1406   LABSPEC 1.029 03/27/2014 1105   PHURINE 5.0 06/11/2017 1406   GLUCOSEU >=500 (A) 06/11/2017 1406   GLUCOSEU Negative 03/27/2014 1105   HGBUR NEGATIVE 06/11/2017 1406   BILIRUBINUR NEGATIVE 06/11/2017 1406   BILIRUBINUR Negative 03/27/2014 1105   KETONESUR NEGATIVE 06/11/2017 1406   PROTEINUR NEGATIVE 06/11/2017 1406   UROBILINOGEN 0.2 01/08/2011 0010   NITRITE POSITIVE (A) 06/11/2017 1406   LEUKOCYTESUR NEGATIVE 06/11/2017 1406   LEUKOCYTESUR Negative 03/27/2014 1105      Imaging Results:    Ct Abdomen Pelvis W Contrast  Result Date: 06/11/2017 CLINICAL DATA:  Acute generalized abdominal pain, back pain, nausea and vomiting for 2 days, RIGHT ear and jaw pain, history uterine cancer, collagen vascular disease, COPD, type II diabetes mellitus, hypertension, NASH EXAM: CT ABDOMEN AND PELVIS WITH CONTRAST TECHNIQUE: Multidetector CT imaging of the abdomen and pelvis was performed using the standard protocol following bolus administration of intravenous contrast. Sagittal and coronal MPR images reconstructed from axial data set. CONTRAST:  131m ISOVUE-300 IOPAMIDOL (ISOVUE-300) INJECTION 61% IV. Dilute oral contrast. COMPARISON:  02/18/2017 FINDINGS: Lower chest: Dependent bibasilar atelectasis Hepatobiliary: Gallbladder surgically absent. Hepatic margins appear slightly nodular likely representing cirrhosis. Cyst lateral segment RIGHT lobe liver adjacent to falciform fissure 2.2 x 1.7 cm previously 1.9 x 1.7 cm. Additional tiny cysts within the liver appear stable. No new focal hepatic mass lesion. Pancreas: Normal appearance Spleen: Splenomegaly, spleen measuring 13.7 x 6.8 x 20.5 cm (volume = 1000 cm^3). No focal lesion. Adrenals/Urinary Tract: Adrenal glands, kidneys, ureters, and bladder normal appearance Stomach/Bowel: Normal appendix. Stranding is seen in the fat adjacent to the distal appendix also present on previous exam, with appendix otherwise  unremarkable.  Stool throughout colon. Questionable wall thickening of sigmoid colon versus artifact from underdistention. Stomach unremarkable. Remaining bowel loops normal appearance. Vascular/Lymphatic: Scattered pelvic phleboliths. Minimal atherosclerotic calcification aorta. Aorta normal caliber. No adenopathy. Reproductive: Uterus surgically absent. Probable normal sized ovaries. Other: No free intraperitoneal air or fluid. Diffuse stranding of mesenteric and retroperitoneal tissue planes slightly increased from previous exam, likely related to cirrhosis and portal hypertension. No hernia. Musculoskeletal: Bones demineralized. IMPRESSION: Cirrhotic appearing liver with splenomegaly. Hepatic cysts, largest 2.2 x 1.7 cm minimally increased. Scattered stranding of retroperitoneal and mesenteric tissue planes, question related to portal hypertension and cirrhosis. Questionable wall thickening of the sigmoid colon versus artifact from underdistention. Electronically Signed   By: Lavonia Zamia M.D.   On: 06/11/2017 17:47   Dg Chest Portable 1 View  Result Date: 06/11/2017 CLINICAL DATA:  Weakness EXAM: PORTABLE CHEST 1 VIEW COMPARISON:  04/29/2017 chest radiograph. FINDINGS: Stable cardiomediastinal silhouette with top-normal heart size. No pneumothorax. No pleural effusion. Lungs appear clear, with no acute consolidative airspace disease and no pulmonary edema. IMPRESSION: No active disease. Electronically Signed   By: Ilona Sorrel M.D.   On: 06/11/2017 15:51    My personal review of EKG: Rhythm NSR   Assessment & Plan:    Active Problems:   Type 2 diabetes mellitus (HCC)   Steatohepatitis   NASH (nonalcoholic steatohepatitis)   Cirrhosis of liver without ascites (HCC)   UTI (urinary tract infection)   1. ?  Colitis-CT scan shows possibility of colitis, will start ciprofloxacin and Flagyl.  Patient started on clear liquid diet.  Start gentle IV hydration with normal saline at 50 ml per hour. 2. UTI-patient has a  normal UA with positive nitrate.  Started on IV ciprofloxacin.  Urine culture has been obtained.  Follow urine culture results. 3. NASH-continue lactulose 20 g 3 times daily, hold Lasix, continue spironolactone 4. Diabetes mellitus-start Levemir 25 units subcu twice a day, sliding scale insulin with NovoLog. 5. COPD-stable, no acute exacerbation.  Continue duo nebs every 6 hours as needed   DVT Prophylaxis-    SCDs   AM Labs Ordered, also please review Full Orders  Family Communication: Admission, patients condition and plan of care including tests being ordered have been discussed with the patient and  who indicate understanding and agree with the plan and Code Status.  Code Status: DNR  Admission status: Inpatient  Time spent in minutes : 60 minutes   Oswald Hillock M.D on 06/11/2017 at 8:43 PM  Between 7am to 7pm - Pager - [phone]. After 7pm go to [email or web address] - password [facility]  [facility] - Office  [phone]

## 2017-06-11 NOTE — ED Notes (Signed)
Attempt report 

## 2017-06-11 NOTE — ED Triage Notes (Signed)
Pt's husband c/o pt has AMS, abdominal pain, back pain, n/v x 2 days. Pt also c/o right ear and jaw pain that started on the drive to the hospital. Pt is alert and answering questions appropriately in triage. Pt A&O x 4.

## 2017-06-11 NOTE — Progress Notes (Signed)
Pharmacy Antibiotic Note  Lori Mckenzie is a 52 y.o. female admitted on 06/11/2017 with UTI.  Pharmacy has been consulted for cipro dosing.  Plan: cipro 475m IV q12h F/u cxs and clinical progress Monitor V/S and labs  Height: 5' 4"  (162.6 cm) Weight: 280 lb (127 kg) IBW/kg (Calculated) : 54.7  Temp (24hrs), Avg:98 F (36.7 C), Min:97.7 F (36.5 C), Max:98.2 F (36.8 C)  Recent Labs  Lab 06/09/17 1329 06/09/17 1419 06/11/17 1443  WBC  --  3.4* 2.5*  CREATININE 0.60  --  0.57    Estimated Creatinine Clearance: 107.3 mL/min (by C-G formula based on SCr of 0.57 mg/dL).    Allergies  Allergen Reactions  . Nsaids Other (See Comments)    Liver problems Liver problems   . Tape Swelling  . Vicodin [Hydrocodone-Acetaminophen] Itching and Rash    Antimicrobials this admission: cipro 6/9>>  Ceftriaxone 1gm IV x 1 in ED 6/9  Dose adjustments this admission: n/a  Microbiology results: 6/9 UCx: pending  Thank you for allowing pharmacy to be a part of this patient's care.  LIsac Sarna BS PVena Austria BCaliforniaClinical Pharmacist Pager #[phone]/09/2017 8:46 PM

## 2017-06-12 DIAGNOSIS — K7460 Unspecified cirrhosis of liver: Secondary | ICD-10-CM

## 2017-06-12 LAB — COMPREHENSIVE METABOLIC PANEL
ALT: 36 U/L (ref 14–54)
AST: 64 U/L — ABNORMAL HIGH (ref 15–41)
Albumin: 3.1 g/dL — ABNORMAL LOW (ref 3.5–5.0)
Alkaline Phosphatase: 86 U/L (ref 38–126)
Anion gap: 5 (ref 5–15)
BUN: 5 mg/dL — ABNORMAL LOW (ref 6–20)
CO2: 30 mmol/L (ref 22–32)
Calcium: 8.4 mg/dL — ABNORMAL LOW (ref 8.9–10.3)
Chloride: 106 mmol/L (ref 101–111)
Creatinine, Ser: 0.51 mg/dL (ref 0.44–1.00)
GFR calc Af Amer: >60 mL/min (ref >60)
GFR calc non Af Amer: >60 mL/min (ref >60)
Glucose, Bld: 180 mg/dL — ABNORMAL HIGH (ref 65–99)
Potassium: 3.9 mmol/L (ref 3.5–5.1)
Sodium: 141 mmol/L (ref 135–145)
Total Bilirubin: 1.6 mg/dL — ABNORMAL HIGH (ref 0.3–1.2)
Total Protein: 5.8 g/dL — ABNORMAL LOW (ref 6.5–8.1)

## 2017-06-12 LAB — GLUCOSE, CAPILLARY
Glucose-Capillary: 175 mg/dL — ABNORMAL HIGH (ref 65–99)
Glucose-Capillary: 193 mg/dL — ABNORMAL HIGH (ref 65–99)
Glucose-Capillary: 168 mg/dL — ABNORMAL HIGH (ref 65–99)

## 2017-06-12 LAB — CBC
HCT: 37.4 % (ref 36.0–46.0)
Hemoglobin: 12.4 g/dL (ref 12.0–15.0)
MCH: 33.3 pg (ref 26.0–34.0)
MCHC: 33.2 g/dL (ref 30.0–36.0)
MCV: 100.5 fL — ABNORMAL HIGH (ref 78.0–100.0)
Platelets: 56 10*3/uL — ABNORMAL LOW (ref 150–400)
RBC: 3.72 MIL/uL — ABNORMAL LOW (ref 3.87–5.11)
RDW: 13.9 % (ref 11.5–15.5)
WBC: 2.5 10*3/uL — ABNORMAL LOW (ref 4.0–10.5)

## 2017-06-12 LAB — HEMOGLOBIN A1C
Hgb A1c MFr Bld: 9 % — ABNORMAL HIGH (ref 4.8–5.6)
Mean Plasma Glucose: 211.6 mg/dL

## 2017-06-12 LAB — MRSA PCR SCREENING: MRSA by PCR: POSITIVE — AB

## 2017-06-12 MED ORDER — FAMOTIDINE IN NACL 20-0.9 MG/50ML-% IV SOLN
20.0000 mg | Freq: Once | INTRAVENOUS | Status: AC
  Administered 2017-06-12: 20 mg via INTRAVENOUS
  Filled 2017-06-12: qty 50

## 2017-06-12 MED ORDER — CAMPHOR-MENTHOL 0.5-0.5 % EX LOTN
TOPICAL_LOTION | CUTANEOUS | Status: DC | PRN
  Administered 2017-06-12: 13:00:00 via TOPICAL
  Filled 2017-06-12: qty 222

## 2017-06-12 MED ORDER — HYDROMORPHONE HCL 1 MG/ML IJ SOLN
1.0000 mg | INTRAMUSCULAR | Status: DC | PRN
  Administered 2017-06-12 – 2017-06-13 (×6): 1 mg via INTRAVENOUS
  Filled 2017-06-12 (×6): qty 1

## 2017-06-12 MED ORDER — MUPIROCIN 2 % EX OINT: 1.0000 "application " | TOPICAL_OINTMENT | Freq: Two times a day (BID) | CUTANEOUS | Status: DC

## 2017-06-12 MED ORDER — CHLORHEXIDINE GLUCONATE CLOTH 2 % EX PADS: 6.0000 | MEDICATED_PAD | Freq: Every day | CUTANEOUS | Status: DC

## 2017-06-12 NOTE — Plan of Care (Signed)
Night shift floor coverage note.  The staff reported that the patient was complaining of abdominal pain of 8/10.  She had a Percocet 5/325 then Toradol with partial relief.  There is no PRN meds.  I will add hydromorphone 1 mg every 4 hours as needed.  Pepcid 20 mg IVP x1 ordered as well.  Tennis Must, MD.

## 2017-06-12 NOTE — Progress Notes (Signed)
PROGRESS NOTE    Lori Mckenzie  FWY:[id number] DOB: 10-Sep-1965 DOA: 06/11/2017 PCP: Ardine Eng, MD   Brief Narrative:   Lori Mckenzie  is a 52 y.o. female, with history of GERD, asthma, diabetes mellitus, [name],, chronic back pain, cirrhosis of liver came to hospital with abdominal pain.  Patient states that pain has been minimal for 1 week and she also complains of nausea and vomiting.  She denies diarrhea.  She has been admitted with colitis and started on ciprofloxacin and Flagyl.  She was also noted to have findings of UTI on urinalysis with urine culture still pending.   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 Abdominal pain likely secondary to colitis and UTI.  Continue to follow urine cultures and treat with ongoing IV ciprofloxacin and Flagyl.  Advance diet to soft today and see how well she tolerates.  Continue gentle IV normal saline hydration. 2. [name] cirrhosis.  Patient has stable ammonia levels and will continue lactulose 3 times daily.  Continue spironolactone. 3. Diabetes mellitus.  Blood glucose levels are elevated but stable.  Continue Levemir 25 units twice daily as well as sliding scale insulin and NovoLog. 4. COPD-stable.  No current exacerbation noted.  Duo nebs every 6 hours as needed. 5. Thrombocytopenia.  No active bleed currently noted and likely related to cirrhosis.  Continue to monitor for trend and avoid antiplatelet agents.   DVT prophylaxis: SCDs Code Status: DNR Family Communication: Husband at bedside Disposition Plan: Continue current treatment with IV antibiotics and follow cultures.  Advance diet as tolerated.   Consultants:   None  Procedures:   None  Antimicrobials:   Ciprofloxacin and Flagyl 6/9->   Subjective: Patient seen and evaluated today with no new acute  complaints or concerns. No acute concerns or events noted overnight.  She continues to have ongoing nausea and is having difficulty tolerating her diet.  She has also had some severe abdominal pain overnight requiring around-the-clock IV narcotics.  Objective: Vitals:   06/11/17 2218 06/12/17 0535 06/12/17 1447 06/12/17 1448  BP: 119/74 93/63  (!) 108/55  Pulse: 88 92  84  Resp: 16 18  (!) 22  Temp: 98.4 F (36.9 C) 97.8 F (36.6 C) 98 F (36.7 C)   TempSrc: Oral Oral Oral   SpO2: 96% 97%  93%  Weight: 120.8 kg (266 lb 6.4 oz)     Height: 5' 2"  (1.575 m)       Intake/Output Summary (Last 24 hours) at 06/12/2017 1524 Last data filed at 06/12/2017 1300 Gross per 24 hour  Intake 1727.5 ml  Output 100 ml  Net 1627.5 ml   Filed Weights   06/11/17 1405 06/11/17 2218  Weight: 127 kg (280 lb) 120.8 kg (266 lb 6.4 oz)    Examination:  General exam: Appears calm and comfortable  Respiratory system: Clear to auscultation. Respiratory effort normal. Cardiovascular system: S1 & S2 heard, RRR. No JVD, murmurs, rubs, gallops or clicks. No pedal edema. Gastrointestinal system: Abdomen is nondistended, soft and nontender. No organomegaly or masses felt. Normal bowel sounds heard. Central nervous system: Alert and oriented. No focal neurological deficits. Extremities: Symmetric 5 x 5 power. Skin: No rashes, lesions or ulcers    Data Reviewed: I have personally reviewed following labs and imaging studies  CBC: Recent Labs  Lab  06/09/17 1419 06/11/17 1443 06/12/17 0617  WBC 3.4* 2.5* 2.5*  NEUTROABS  --  1.6*  --   HGB 13.6 13.5 12.4  HCT 39.6 40.4 37.4  MCV 101.3* 100.0 100.5*  PLT 67* 58* 56*   Basic Metabolic Panel: Recent Labs  Lab 06/09/17 1329 06/11/17 1443 06/12/17 0617  NA 138 139 141  K 4.0 3.8 3.9  CL 103 100* 106  CO2 27 32 30  GLUCOSE 165* 302* 180*  BUN 9 6 5*  CREATININE 0.60 0.57 0.51  CALCIUM 8.8* 9.1 8.4*   GFR: Estimated Creatinine Clearance: 100.7  mL/min (by C-G formula based on SCr of 0.51 mg/dL). Liver Function Tests: Recent Labs  Lab 06/09/17 1329 06/11/17 1443 06/12/17 0617  AST 55* 57* 64*  ALT 37 37 36  ALKPHOS 95 102 86  BILITOT 1.5* 1.4* 1.6*  PROT 6.6 6.6 5.8*  ALBUMIN 3.8 3.6 3.1*   No results for input(s): LIPASE, AMYLASE in the last 168 hours. Recent Labs  Lab 06/09/17 1331 06/11/17 1443  AMMONIA 55* 42*   Coagulation Profile: No results for input(s): INR, PROTIME in the last 168 hours. Cardiac Enzymes: No results for input(s): CKTOTAL, CKMB, CKMBINDEX, TROPONINI in the last 168 hours. BNP (last 3 results) No results for input(s): PROBNP in the last 8760 hours. HbA1C: No results for input(s): HGBA1C in the last 72 hours. CBG: Recent Labs  Lab 06/09/17 1311 06/11/17 1409 06/11/17 2219 06/12/17 0726 06/12/17 1138  GLUCAP 171* 295* 255* 175* 193*   Lipid Profile: No results for input(s): CHOL, HDL, LDLCALC, TRIG, CHOLHDL, LDLDIRECT in the last 72 hours. Thyroid Function Tests: No results for input(s): TSH, T4TOTAL, FREET4, T3FREE, THYROIDAB in the last 72 hours. Anemia Panel: No results for input(s): VITAMINB12, FOLATE, FERRITIN, TIBC, IRON, RETICCTPCT in the last 72 hours. Sepsis Labs: No results for input(s): PROCALCITON, LATICACIDVEN in the last 168 hours.  Recent Results (from the past 240 hour(s))  MRSA PCR Screening     Status: Abnormal   Collection Time: 06/11/17 10:33 PM  Result Value Ref Range Status   MRSA by PCR POSITIVE (A) NEGATIVE Final    Comment:        The GeneXpert MRSA Assay (FDA approved for NASAL specimens only), is one component of a comprehensive MRSA colonization surveillance program. It is not intended to diagnose MRSA infection nor to guide or monitor treatment for MRSA infections. RESULT CALLED TO, READ BACK BY AND VERIFIED WITH: HARRIS,B @ 0230 ON 06/12/17 BY JUW Performed at [facility], [address], [place], [place] [address]          Radiology  Studies: Ct Abdomen Pelvis W Contrast  Result Date: 06/11/2017 CLINICAL DATA:  Acute generalized abdominal pain, back pain, nausea and vomiting for 2 days, RIGHT ear and jaw pain, history uterine cancer, collagen vascular disease, COPD, type II diabetes mellitus, hypertension, NASH EXAM: CT ABDOMEN AND PELVIS WITH CONTRAST TECHNIQUE: Multidetector CT imaging of the abdomen and pelvis was performed using the standard protocol following bolus administration of intravenous contrast. Sagittal and coronal MPR images reconstructed from axial data set. CONTRAST:  148m ISOVUE-300 IOPAMIDOL (ISOVUE-300) INJECTION 61% IV. Dilute oral contrast. COMPARISON:  02/18/2017 FINDINGS: Lower chest: Dependent bibasilar atelectasis Hepatobiliary: Gallbladder surgically absent. Hepatic margins appear slightly nodular likely representing cirrhosis. Cyst lateral segment RIGHT lobe liver adjacent to falciform fissure 2.2 x 1.7 cm previously 1.9 x 1.7 cm. Additional tiny cysts within the liver appear stable. No new focal hepatic mass lesion. Pancreas: Normal appearance  Spleen: Splenomegaly, spleen measuring 13.7 x 6.8 x 20.5 cm (volume = 1000 cm^3). No focal lesion. Adrenals/Urinary Tract: Adrenal glands, kidneys, ureters, and bladder normal appearance Stomach/Bowel: Normal appendix. Stranding is seen in the fat adjacent to the distal appendix also present on previous exam, with appendix otherwise unremarkable. Stool throughout colon. Questionable wall thickening of sigmoid colon versus artifact from underdistention. Stomach unremarkable. Remaining bowel loops normal appearance. Vascular/Lymphatic: Scattered pelvic phleboliths. Minimal atherosclerotic calcification aorta. Aorta normal caliber. No adenopathy. Reproductive: Uterus surgically absent. Probable normal sized ovaries. Other: No free intraperitoneal air or fluid. Diffuse stranding of mesenteric and retroperitoneal tissue planes slightly increased from previous exam, likely  related to cirrhosis and portal hypertension. No hernia. Musculoskeletal: Bones demineralized. IMPRESSION: Cirrhotic appearing liver with splenomegaly. Hepatic cysts, largest 2.2 x 1.7 cm minimally increased. Scattered stranding of retroperitoneal and mesenteric tissue planes, question related to portal hypertension and cirrhosis. Questionable wall thickening of the sigmoid colon versus artifact from underdistention. Electronically Signed   By: Lavonia Ellina M.D.   On: 06/11/2017 17:47   Dg Chest Portable 1 View  Result Date: 06/11/2017 CLINICAL DATA:  Weakness EXAM: PORTABLE CHEST 1 VIEW COMPARISON:  04/29/2017 chest radiograph. FINDINGS: Stable cardiomediastinal silhouette with top-normal heart size. No pneumothorax. No pleural effusion. Lungs appear clear, with no acute consolidative airspace disease and no pulmonary edema. IMPRESSION: No active disease. Electronically Signed   By: Ilona Sorrel M.D.   On: 06/11/2017 15:51        Scheduled Meds: . [START ON 06/13/2017] Chlorhexidine Gluconate Cloth  6 each Topical Q0600  . citalopram  40 mg Oral Daily  . insulin aspart  0-9 Units Subcutaneous TID WC  . insulin detemir  25 Units Subcutaneous BID  . lactulose  20 g Oral TID  . [START ON 06/13/2017] mupirocin ointment  1 application Nasal BID  . pantoprazole  40 mg Oral BID AC  . rifaximin  550 mg Oral Daily  . simethicone  80 mg Oral QID  . spironolactone  100 mg Oral Daily   Continuous Infusions: . sodium chloride 50 mL/hr at 06/11/17 2247  . ciprofloxacin Stopped (06/12/17 2800)  . metronidazole Stopped (06/12/17 3491)     LOS: 1 day    Time spent: 30 minutes    Morley Gaumer Darleen Crocker, DO [facility] Pager [phone]  If 7PM-7AM, please contact night-coverage [email or web address] Password [email or web address] 06/12/2017, 3:24 PM

## 2017-06-12 NOTE — Progress Notes (Signed)
Patient removed her IV earlier thinking that she would need to leave to take her husband to his surgery tomorrow.  Dr.  Manuella Ghazi talked to her and she worked out with her husband for him to get a ride to his surgery.  A new IV started and was able to received her IV dilaudid and antibiotic  Patient drowsy but awakens to name and swallows pills with no difficulty.

## 2017-06-13 LAB — CBC
HCT: 38.3 % (ref 36.0–46.0)
Hemoglobin: 12.6 g/dL (ref 12.0–15.0)
MCH: 33.4 pg (ref 26.0–34.0)
MCHC: 32.9 g/dL (ref 30.0–36.0)
MCV: 101.6 fL — ABNORMAL HIGH (ref 78.0–100.0)
Platelets: 53 K/uL — ABNORMAL LOW (ref 150–400)
RBC: 3.77 MIL/uL — ABNORMAL LOW (ref 3.87–5.11)
RDW: 13.9 % (ref 11.5–15.5)
WBC: 2.9 K/uL — ABNORMAL LOW (ref 4.0–10.5)

## 2017-06-13 LAB — GLUCOSE, CAPILLARY
Glucose-Capillary: 214 mg/dL — ABNORMAL HIGH (ref 65–99)
Glucose-Capillary: 181 mg/dL — ABNORMAL HIGH (ref 65–99)
Glucose-Capillary: 265 mg/dL — ABNORMAL HIGH (ref 65–99)

## 2017-06-13 LAB — COMPREHENSIVE METABOLIC PANEL
ALT: 39 U/L (ref 14–54)
AST: 58 U/L — ABNORMAL HIGH (ref 15–41)
Albumin: 3.4 g/dL — ABNORMAL LOW (ref 3.5–5.0)
Alkaline Phosphatase: 75 U/L (ref 38–126)
Anion gap: 5 (ref 5–15)
BUN: <5 mg/dL — ABNORMAL LOW (ref 6–20)
CO2: 32 mmol/L (ref 22–32)
Calcium: 8.6 mg/dL — ABNORMAL LOW (ref 8.9–10.3)
Chloride: 101 mmol/L (ref 101–111)
Creatinine, Ser: 0.57 mg/dL (ref 0.44–1.00)
GFR calc Af Amer: >60 mL/min (ref >60)
GFR calc non Af Amer: >60 mL/min (ref >60)
Glucose, Bld: 191 mg/dL — ABNORMAL HIGH (ref 65–99)
Potassium: 4 mmol/L (ref 3.5–5.1)
Sodium: 138 mmol/L (ref 135–145)
Total Bilirubin: 1.7 mg/dL — ABNORMAL HIGH (ref 0.3–1.2)
Total Protein: 6.2 g/dL — ABNORMAL LOW (ref 6.5–8.1)

## 2017-06-13 LAB — AMMONIA: Ammonia: 40 umol/L — ABNORMAL HIGH (ref 9–35)

## 2017-06-13 MED ORDER — OXYCODONE HCL 5 MG PO TABS: 5.0000 mg | ORAL_TABLET | Freq: Four times a day (QID) | ORAL | 0 refills | Status: AC | PRN

## 2017-06-13 MED ORDER — PHENOL 1.4 % MT LIQD
1.0000 | OROMUCOSAL | Status: DC | PRN
  Administered 2017-06-13: 1 via OROMUCOSAL
  Filled 2017-06-13: qty 177

## 2017-06-13 MED ORDER — OXYCODONE HCL 5 MG PO TABS
10.0000 mg | ORAL_TABLET | Freq: Four times a day (QID) | ORAL | Status: DC | PRN
  Administered 2017-06-13: 10 mg via ORAL
  Filled 2017-06-13: qty 2

## 2017-06-13 MED ORDER — OXYCODONE HCL 10 MG PO TABS: 10.0000 mg | ORAL_TABLET | Freq: Every day | ORAL | Status: DC

## 2017-06-13 MED ORDER — OXYCODONE HCL 5 MG PO TABS: 5.0000 mg | ORAL_TABLET | Freq: Four times a day (QID) | ORAL | 0 refills | Status: DC | PRN

## 2017-06-13 NOTE — Progress Notes (Signed)
Lori Mckenzie discharged Home per MD order.  Discharge instructions reviewed and discussed with the patient, all questions and concerns answered. Copy of instructions and scripts given to patient.  Allergies as of 06/13/2017      Reactions   Tape Swelling   Vicodin [hydrocodone-acetaminophen] Itching, Rash      Medication List    TAKE these medications   albuterol 108 (90 Base) MCG/ACT inhaler Commonly known as:  PROVENTIL HFA;VENTOLIN HFA Inhale 2 puffs into the lungs every 6 (six) hours as needed for wheezing or shortness of breath.   ALPRAZolam 1 MG tablet Commonly known as:  XANAX Take 1 mg by mouth 3 (three) times daily.   cholecalciferol 1000 units tablet Commonly known as:  VITAMIN D Take 2,000 Units by mouth daily.   citalopram 40 MG tablet Commonly known as:  CELEXA Take 40 mg by mouth daily.   diclofenac sodium 1 % Gel Commonly known as:  VOLTAREN Apply 4 g topically every 8 (eight) hours as needed. What changed:  reasons to take this   fluticasone 50 MCG/ACT nasal spray Commonly known as:  FLONASE Place 1-2 sprays into both nostrils daily.   furosemide 40 MG tablet Commonly known as:  LASIX Take 1 tablet (40 mg total) by mouth 2 (two) times daily. What changed:    when to take this  reasons to take this   gabapentin 300 MG capsule Commonly known as:  NEURONTIN Take 600 mg by mouth 4 (four) times daily.   ibuprofen 600 MG tablet Commonly known as:  ADVIL,MOTRIN Take 1 tablet (600 mg total) by mouth every 8 (eight) hours as needed.   insulin aspart 100 UNIT/ML injection Commonly known as:  novoLOG Inject 44 Units into the skin 3 (three) times daily with meals.   insulin detemir 100 UNIT/ML injection Commonly known as:  LEVEMIR Inject 0.25 mLs (25 Units total) into the skin 2 (two) times daily. What changed:  how much to take   INTRINSI B12-FOLATE 800-500-20 MCG-MCG-MG Tabs Take 1 tablet by mouth daily.   ipratropium-albuterol 0.5-2.5 (3) MG/3ML  Soln Commonly known as:  DUONEB Take 3 mLs by nebulization every 4 (four) hours as needed. For shortness of breath/wheezing   lactulose 10 GM/15ML solution Commonly known as:  CHRONULAC Take 30 mLs (20 g total) by mouth 3 (three) times daily. What changed:    how much to take  when to take this   lidocaine 5 % Commonly known as:  LIDODERM Place 1 patch onto the skin daily. Remove & Discard patch within 12 hours or as directed by MD What changed:    when to take this  reasons to take this  additional instructions   nystatin powder Commonly known as:  MYCOSTATIN/NYSTOP Apply 1 g topically 3 (three) times daily as needed. For irritation   Oxycodone HCl 10 MG Tabs Take 10 mg by mouth 6 (six) times daily.   oxyCODONE 5 MG immediate release tablet Commonly known as:  ROXICODONE Take 1 tablet (5 mg total) by mouth every 6 (six) hours as needed for up to 3 days for severe pain.   pantoprazole 40 MG tablet Commonly known as:  PROTONIX Take 40 mg by mouth 2 (two) times daily before a meal.   potassium chloride SA 20 MEQ tablet Commonly known as:  K-DUR,KLOR-CON Take 20 mEq by mouth daily as needed. For high blood pressure   promethazine 12.5 MG tablet Commonly known as:  PHENERGAN Take 2 tablets (25 mg total) by mouth  every 6 (six) hours as needed for nausea or vomiting. What changed:  how much to take   rizatriptan 10 MG tablet Commonly known as:  MAXALT Take 10 mg by mouth as needed for migraine.   spironolactone 100 MG tablet Commonly known as:  ALDACTONE Take 100 mg by mouth daily.   vitamin B-12 1000 MCG tablet Commonly known as:  CYANOCOBALAMIN Take 2,000 mcg by mouth daily.   XIFAXAN 550 MG Tabs tablet Generic drug:  rifaximin Take 550 mg by mouth daily.       Patients skin is clean, dry and intact, no evidence of skin break down. IV site discontinued and catheter remains intact. Site without signs and symptoms of complications. Dressing and pressure  applied.  Patient escorted to car by NT in a wheelchair,  no distress noted upon discharge.  Lori Mckenzie 06/13/2017 5:16 PM

## 2017-06-13 NOTE — Progress Notes (Addendum)
Inpatient Diabetes Program Recommendations  AACE/ADA: New Consensus Statement on Inpatient Glycemic Control (2015)  Target Ranges:  Prepandial:   less than 140 mg/dL      Peak postprandial:   less than 180 mg/dL (1-2 hours)      Critically ill patients:  140 - 180 mg/dL   Results for Lori Mckenzie, Lori Mckenzie (MRN [id number]) as of 06/13/2017 14:40  Ref. Range 06/12/2017 07:26 06/12/2017 11:38 06/12/2017 16:24 06/12/2017 21:10 06/13/2017 07:40 06/13/2017 11:18  Glucose-Capillary Latest Ref Range: 65 - 99 mg/dL 175 (H) 193 (H) 168 (H) 214 (H) 181 (H) 265 (H)   Review of Glycemic Control  Diabetes history: DM2 Outpatient Diabetes medications: Levemir 96 units BID, Novolog 44 units TID with meals Current orders for Inpatient glycemic control: Levemir 25 units BID, Novolog 0-9 units TID with meals  Inpatient Diabetes Program Recommendations:  Insulin - Meal Coverage: Please consider ordering Novolog 4 units TID with meals for meal coverage if patient eats at least 50% of meals. Insulin-Correction: Please consider ordering Novolog 0-5 units QHS for bedtime correction scale. HgbA1C: A1C 9% on 06/12/17 indicating an average glucose of 212 mg/dl over the past 2-3 months.  NOTE: Spoke with patient about diabetes and home regimen for diabetes control. Patient reports that she is followed by PCP at [facility] for diabetes management currently and she states that she use to see Dr. Graceann Congress (Endocrinologist in [place]) before she left the practice last year. Patient reports that she is taking Levemir 96 units BID, Novolog 44 units TID with meals as an outpatient for diabetes control. Patient states that she is able to get all her medications and DM supplies without any issues. Patient reports that she is taking insulin as prescribed. Patient states that she checks her glucose 3-4 times per day and that it is usually in the 200-300's mg/dl but notes it has been running better the past couple of days since she has  not been eating much.   Discussed A1C results (9% on 06/12/17) and explained that his current A1C indicates an average glucose of 212 mg/dl over the past 2-3 months. Discussed glucose and A1C goals. Discussed importance of checking CBGs and maintaining good CBG control to prevent long-term and short-term complications. Explained how hyperglycemia leads to damage within blood vessels which lead to the common complications seen with uncontrolled diabetes. Stressed to the patient the importance of improving glycemic control to prevent further complications from uncontrolled diabetes. Discussed impact of nutrition, exercise, stress, sickness, and medications on diabetes control.Patient reports that she has been stressed about her husband and his health (bladder cancer) and reports that he had surgery today in [place] for bladder cancer.  Encouraged patient to get established with a new Endocrinologist to assist with DM control and management. Encouraged patient to check glucose 4 times per day (before meals and at bedtime) and to keep a log book of glucose readings and insulin taken which she will need to take to doctor appointments.  Patient verbalized understanding of information discussed and she states that she has no further questions at this time related to diabetes.  Thanks, Barnie Alderman, RN, MSN, CDE Diabetes Coordinator Inpatient Diabetes Program [phone] (Team Pager)

## 2017-06-13 NOTE — Discharge Summary (Signed)
Physician Discharge Summary  Lori Mckenzie ZCH:[id number] DOB: September 27, 1965 DOA: 06/11/2017  PCP: Ardine Eng, MD  Admit date: 06/11/2017  Discharge date: 06/13/2017  Admitted From:Home  Disposition:  Home  Recommendations for Outpatient Follow-up:  1. Follow up with PCP in 3-5 days 2. Establish follow up with Endocrinology for DM management   Home Health:N/A  Equipment/Devices:None  Discharge Condition:Stable  CODE STATUS: DNR  Diet recommendation: Heart Healthy/Carb modified  Brief/Interim Summary:  DanaSmithis a53 y.o.female,with history of GERD, asthma, diabetes mellitus, Lori Mckenzie,, chronic back pain, cirrhosis of liver came to hospital with abdominal pain. Patient states that pain has been minimal for 1 week and she also complains of nausea and vomiting. She denies diarrhea.  She has been admitted with suspicion of colitis and was started on IV ciprofloxacin and Flagyl.  She was also noted to have findings of UTI on urinalysis with noted growth of E. coli.  She does have chronic abdominal pain and low back pain for which she has frequent ED visits with repeat CT scanning noted.  I do not feel that she has actually had colitis but did receive adequate treatment for E. coli UTI with 3-day course of Cipro and Flagyl.  She denies any further dysuria symptoms and is able to tolerate diet without any further complication.  No other acute events during the course of this hospitalization were noted aside from some intermittent confusion which appears to be a chronic issue for her due to her home narcotics, benzodiazepines, and borderline elevated ammonia levels.  She is compliant with her lactulose treatment and has not had significant lactulose elevations while hospitalized.  Her blood glucose levels were noted to be elevated for which she has been recommended endocrine follow-up in the near future.   Discharge Diagnoses:  Active Problems:   Type 2 diabetes mellitus (HCC)    Steatohepatitis   NASH (nonalcoholic steatohepatitis)   Cirrhosis of liver without ascites (HCC)   UTI (urinary tract infection) 1. Abdominal pain likely secondary to E Coli UTI.    Completed treatment with 3-day course of Flagyl and ciprofloxacin with no further symptoms noted.  She is tolerating diet and feeling much better.  Follow-up with primary care physician in the outpatient setting in 3 to 5 days. 2. Lori Mckenzie cirrhosis.  Patient has stable ammonia levels and will continue lactulose 3 times daily.  Continue spironolactone. 3. Diabetes mellitus.  Blood glucose levels are elevated but stable.  Continue Levemir 25 units twice daily as well as sliding scale insulin and NovoLog.  Recommended outpatient follow-up with endocrinology in the near future. 4. COPD-stable.  No current exacerbation noted.  Duo nebs every 6 hours as needed. 5. Thrombocytopenia.  No active bleed currently noted and likely related to cirrhosis.  Continue to monitor for trend and avoid antiplatelet agents.  This has remained stable.   Discharge Instructions  Discharge Instructions    Diet - low sodium heart healthy   Complete by:  As directed    Increase activity slowly   Complete by:  As directed      Allergies as of 06/13/2017      Reactions   Tape Swelling   Vicodin [hydrocodone-acetaminophen] Itching, Rash      Medication List    TAKE these medications   albuterol 108 (90 Base) MCG/ACT inhaler Commonly known as:  PROVENTIL HFA;VENTOLIN HFA Inhale 2 puffs into the lungs every 6 (six) hours as needed for wheezing or shortness of breath.   ALPRAZolam 1 MG tablet Commonly known  as:  XANAX Take 1 mg by mouth 3 (three) times daily.   cholecalciferol 1000 units tablet Commonly known as:  VITAMIN D Take 2,000 Units by mouth daily.   citalopram 40 MG tablet Commonly known as:  CELEXA Take 40 mg by mouth daily.   diclofenac sodium 1 % Gel Commonly known as:  VOLTAREN Apply 4 g topically every 8 (eight)  hours as needed. What changed:  reasons to take this   fluticasone 50 MCG/ACT nasal spray Commonly known as:  FLONASE Place 1-2 sprays into both nostrils daily.   furosemide 40 MG tablet Commonly known as:  LASIX Take 1 tablet (40 mg total) by mouth 2 (two) times daily. What changed:    when to take this  reasons to take this   gabapentin 300 MG capsule Commonly known as:  NEURONTIN Take 600 mg by mouth 4 (four) times daily.   ibuprofen 600 MG tablet Commonly known as:  ADVIL,MOTRIN Take 1 tablet (600 mg total) by mouth every 8 (eight) hours as needed.   insulin aspart 100 UNIT/ML injection Commonly known as:  novoLOG Inject 44 Units into the skin 3 (three) times daily with meals.   insulin detemir 100 UNIT/ML injection Commonly known as:  LEVEMIR Inject 0.25 mLs (25 Units total) into the skin 2 (two) times daily. What changed:  how much to take   INTRINSI B12-FOLATE 800-500-20 MCG-MCG-MG Tabs Take 1 tablet by mouth daily.   ipratropium-albuterol 0.5-2.5 (3) MG/3ML Soln Commonly known as:  DUONEB Take 3 mLs by nebulization every 4 (four) hours as needed. For shortness of breath/wheezing   lactulose 10 GM/15ML solution Commonly known as:  CHRONULAC Take 30 mLs (20 g total) by mouth 3 (three) times daily. What changed:    how much to take  when to take this   lidocaine 5 % Commonly known as:  LIDODERM Place 1 patch onto the skin daily. Remove & Discard patch within 12 hours or as directed by MD What changed:    when to take this  reasons to take this  additional instructions   nystatin powder Commonly known as:  MYCOSTATIN/NYSTOP Apply 1 g topically 3 (three) times daily as needed. For irritation   Oxycodone HCl 10 MG Tabs Take 10 mg by mouth 6 (six) times daily.   oxyCODONE 5 MG immediate release tablet Commonly known as:  ROXICODONE Take 1 tablet (5 mg total) by mouth every 6 (six) hours as needed for up to 3 days for severe pain.   pantoprazole  40 MG tablet Commonly known as:  PROTONIX Take 40 mg by mouth 2 (two) times daily before a meal.   potassium chloride SA 20 MEQ tablet Commonly known as:  K-DUR,KLOR-CON Take 20 mEq by mouth daily as needed. For high blood pressure   promethazine 12.5 MG tablet Commonly known as:  PHENERGAN Take 2 tablets (25 mg total) by mouth every 6 (six) hours as needed for nausea or vomiting. What changed:  how much to take   rizatriptan 10 MG tablet Commonly known as:  MAXALT Take 10 mg by mouth as needed for migraine.   spironolactone 100 MG tablet Commonly known as:  ALDACTONE Take 100 mg by mouth daily.   vitamin B-12 1000 MCG tablet Commonly known as:  CYANOCOBALAMIN Take 2,000 mcg by mouth daily.   XIFAXAN 550 MG Tabs tablet Generic drug:  rifaximin Take 550 mg by mouth daily.      Follow-up Information    Ashkin, Neldon Labella, MD  Follow up in 3 day(s).   Specialty:  Family Medicine Contact information: [address] [place] [address] [phone]        endocrinology Follow up in 2 week(s).          Allergies  Allergen Reactions  . Tape Swelling  . Vicodin [Hydrocodone-Acetaminophen] Itching and Rash    Consultations:  None   Procedures/Studies: Ct Abdomen Pelvis W Contrast  Result Date: 06/11/2017 CLINICAL DATA:  Acute generalized abdominal pain, back pain, nausea and vomiting for 2 days, RIGHT ear and jaw pain, history uterine cancer, collagen vascular disease, COPD, type II diabetes mellitus, hypertension, NASH EXAM: CT ABDOMEN AND PELVIS WITH CONTRAST TECHNIQUE: Multidetector CT imaging of the abdomen and pelvis was performed using the standard protocol following bolus administration of intravenous contrast. Sagittal and coronal MPR images reconstructed from axial data set. CONTRAST:  169m ISOVUE-300 IOPAMIDOL (ISOVUE-300) INJECTION 61% IV. Dilute oral contrast. COMPARISON:  02/18/2017 FINDINGS: Lower chest: Dependent bibasilar atelectasis Hepatobiliary:  Gallbladder surgically absent. Hepatic margins appear slightly nodular likely representing cirrhosis. Cyst lateral segment RIGHT lobe liver adjacent to falciform fissure 2.2 x 1.7 cm previously 1.9 x 1.7 cm. Additional tiny cysts within the liver appear stable. No new focal hepatic mass lesion. Pancreas: Normal appearance Spleen: Splenomegaly, spleen measuring 13.7 x 6.8 x 20.5 cm (volume = 1000 cm^3). No focal lesion. Adrenals/Urinary Tract: Adrenal glands, kidneys, ureters, and bladder normal appearance Stomach/Bowel: Normal appendix. Stranding is seen in the fat adjacent to the distal appendix also present on previous exam, with appendix otherwise unremarkable. Stool throughout colon. Questionable wall thickening of sigmoid colon versus artifact from underdistention. Stomach unremarkable. Remaining bowel loops normal appearance. Vascular/Lymphatic: Scattered pelvic phleboliths. Minimal atherosclerotic calcification aorta. Aorta normal caliber. No adenopathy. Reproductive: Uterus surgically absent. Probable normal sized ovaries. Other: No free intraperitoneal air or fluid. Diffuse stranding of mesenteric and retroperitoneal tissue planes slightly increased from previous exam, likely related to cirrhosis and portal hypertension. No hernia. Musculoskeletal: Bones demineralized. IMPRESSION: Cirrhotic appearing liver with splenomegaly. Hepatic cysts, largest 2.2 x 1.7 cm minimally increased. Scattered stranding of retroperitoneal and mesenteric tissue planes, question related to portal hypertension and cirrhosis. Questionable wall thickening of the sigmoid colon versus artifact from underdistention. Electronically Signed   By: MLavonia DanaM.D.   On: 06/11/2017 17:47   Dg Chest Portable 1 View  Result Date: 06/11/2017 CLINICAL DATA:  Weakness EXAM: PORTABLE CHEST 1 VIEW COMPARISON:  04/29/2017 chest radiograph. FINDINGS: Stable cardiomediastinal silhouette with top-normal heart size. No pneumothorax. No pleural  effusion. Lungs appear clear, with no acute consolidative airspace disease and no pulmonary edema. IMPRESSION: No active disease. Electronically Signed   By: JIlona SorrelM.D.   On: 06/11/2017 15:51   Discharge Exam: Vitals:   06/13/17 0553 06/13/17 0555  BP: (!) 93/31 115/77  Pulse: 91 87  Resp: 18   Temp: 98.2 F (36.8 C)   SpO2: 93%    Vitals:   06/12/17 2136 06/13/17 0253 06/13/17 0553 06/13/17 0555  BP: 112/67  (!) 93/31 115/77  Pulse: 87  91 87  Resp:   18   Temp: 98.3 F (36.8 C) 98.3 F (36.8 C) 98.2 F (36.8 C)   TempSrc: Oral Oral Oral   SpO2: 92%  93%   Weight:      Height:        General: Pt is alert, awake, not in acute distress; obese Cardiovascular: RRR, S1/S2 +, no rubs, no gallops Respiratory: CTA bilaterally, no wheezing, no rhonchi Abdominal:  Soft, NT, ND, bowel sounds + Extremities: no edema, no cyanosis    The results of significant diagnostics from this hospitalization (including imaging, microbiology, ancillary and laboratory) are listed below for reference.     Microbiology: Recent Results (from the past 240 hour(s))  Urine Culture     Status: Abnormal (Preliminary result)   Collection Time: 06/11/17  3:17 PM  Result Value Ref Range Status   Specimen Description   Final    URINE, CLEAN CATCH Performed at [facility], [address], [place], [place] [address]    Special Requests   Final    NONE Performed at [facility], [address] [place], [place] [address]    Culture >=100,000 COLONIES/mL ESCHERICHIA COLI (A)  Final   Report Status PENDING  Incomplete  MRSA PCR Screening     Status: Abnormal   Collection Time: 06/11/17 10:33 PM  Result Value Ref Range Status   MRSA by PCR POSITIVE (A) NEGATIVE Final    Comment:        The GeneXpert MRSA Assay (FDA approved for NASAL specimens only), is one component of a comprehensive MRSA colonization surveillance program. It is not intended to diagnose MRSA infection nor to guide  or monitor treatment for MRSA infections. RESULT CALLED TO, READ BACK BY AND VERIFIED WITH: HARRIS,B @ 0230 ON 06/12/17 BY JUW Performed at [facility], [address], [place], [place] [address]      Labs: BNP (last 3 results) Recent Labs    06/11/17 1443  BNP 54.9   Basic Metabolic Panel: Recent Labs  Lab 06/09/17 1329 06/11/17 1443 06/12/17 0617 06/13/17 0602  NA 138 139 141 138  K 4.0 3.8 3.9 4.0  CL 103 100* 106 101  CO2 27 32 30 32  GLUCOSE 165* 302* 180* 191*  BUN 9 6 5* <5*  CREATININE 0.60 0.57 0.51 0.57  CALCIUM 8.8* 9.1 8.4* 8.6*   Liver Function Tests: Recent Labs  Lab 06/09/17 1329 06/11/17 1443 06/12/17 0617 06/13/17 0602  AST 55* 57* 64* 58*  ALT 37 37 36 39  ALKPHOS 95 102 86 75  BILITOT 1.5* 1.4* 1.6* 1.7*  PROT 6.6 6.6 5.8* 6.2*  ALBUMIN 3.8 3.6 3.1* 3.4*   No results for input(s): LIPASE, AMYLASE in the last 168 hours. Recent Labs  Lab 06/09/17 1331 06/11/17 1443 06/13/17 0602  AMMONIA 55* 42* 40*   CBC: Recent Labs  Lab 06/09/17 1419 06/11/17 1443 06/12/17 0617 06/13/17 0602  WBC 3.4* 2.5* 2.5* 2.9*  NEUTROABS  --  1.6*  --   --   HGB 13.6 13.5 12.4 12.6  HCT 39.6 40.4 37.4 38.3  MCV 101.3* 100.0 100.5* 101.6*  PLT 67* 58* 56* 53*   Cardiac Enzymes: No results for input(s): CKTOTAL, CKMB, CKMBINDEX, TROPONINI in the last 168 hours. BNP: Invalid input(s): POCBNP CBG: Recent Labs  Lab 06/12/17 1138 06/12/17 1624 06/12/17 2110 06/13/17 0740 06/13/17 1118  GLUCAP 193* 168* 214* 181* 265*   D-Dimer No results for input(s): DDIMER in the last 72 hours. Hgb A1c Recent Labs    06/12/17 0617  HGBA1C 9.0*   Lipid Profile No results for input(s): CHOL, HDL, LDLCALC, TRIG, CHOLHDL, LDLDIRECT in the last 72 hours. Thyroid function studies No results for input(s): TSH, T4TOTAL, T3FREE, THYROIDAB in the last 72 hours.  Invalid input(s): FREET3 Anemia work up No results for input(s): VITAMINB12, FOLATE, FERRITIN,  TIBC, IRON, RETICCTPCT in the last 72 hours. Urinalysis    Component Value Date/Time  COLORURINE YELLOW 06/11/2017 [place] 06/11/2017 1406   APPEARANCEUR Hazy 03/27/2014 1105   LABSPEC 1.012 06/11/2017 1406   LABSPEC 1.029 03/27/2014 1105   PHURINE 5.0 06/11/2017 1406   GLUCOSEU >=500 (A) 06/11/2017 1406   GLUCOSEU Negative 03/27/2014 1105   HGBUR NEGATIVE 06/11/2017 1406   BILIRUBINUR NEGATIVE 06/11/2017 1406   BILIRUBINUR Negative 03/27/2014 1105   KETONESUR NEGATIVE 06/11/2017 1406   PROTEINUR NEGATIVE 06/11/2017 1406   UROBILINOGEN 0.2 01/08/2011 0010   NITRITE POSITIVE (A) 06/11/2017 1406   LEUKOCYTESUR NEGATIVE 06/11/2017 1406   LEUKOCYTESUR Negative 03/27/2014 1105   Sepsis Labs Invalid input(s): PROCALCITONIN,  WBC,  LACTICIDVEN Microbiology Recent Results (from the past 240 hour(s))  Urine Culture     Status: Abnormal (Preliminary result)   Collection Time: 06/11/17  3:17 PM  Result Value Ref Range Status   Specimen Description   Final    URINE, CLEAN CATCH Performed at [facility], [address], [place], [place] [address]    Special Requests   Final    NONE Performed at [facility], [address], [place], [place] [address]    Culture >=100,000 COLONIES/mL ESCHERICHIA COLI (A)  Final   Report Status PENDING  Incomplete  MRSA PCR Screening     Status: Abnormal   Collection Time: 06/11/17 10:33 PM  Result Value Ref Range Status   MRSA by PCR POSITIVE (A) NEGATIVE Final    Comment:        The GeneXpert MRSA Assay (FDA approved for NASAL specimens only), is one component of a comprehensive MRSA colonization surveillance program. It is not intended to diagnose MRSA infection nor to guide or monitor treatment for MRSA infections. RESULT CALLED TO, READ BACK BY AND VERIFIED WITH: HARRIS,B @ 0230 ON 06/12/17 BY JUW Performed at [facility], [address] [place], [place] [address]      Time coordinating discharge: 40  minutes  SIGNED:   Rodena Goldmann, DO [facility] 06/13/2017, 3:33 PM Pager [phone]  If 7PM-7AM, please contact night-coverage [email or web address] Password [email or web address]

## 2017-06-14 LAB — URINE CULTURE: Culture: >=100000 — AB

## 2017-06-16 ENCOUNTER — Other Ambulatory Visit: Payer: Self-pay

## 2017-06-16 ENCOUNTER — Encounter (HOSPITAL_COMMUNITY): Payer: Self-pay | Admitting: Emergency Medicine

## 2017-06-16 ENCOUNTER — Emergency Department (HOSPITAL_COMMUNITY): Payer: BLUE CROSS/BLUE SHIELD

## 2017-06-16 ENCOUNTER — Emergency Department (HOSPITAL_COMMUNITY)
Admission: EM | Admit: 2017-06-16 | Discharge: 2017-06-16 | Disposition: A | Discharge status: Home / Self Care | Payer: BLUE CROSS/BLUE SHIELD | Attending: Emergency Medicine | Admitting: Emergency Medicine

## 2017-06-16 DIAGNOSIS — Z794 Long term (current) use of insulin: Secondary | ICD-10-CM | POA: Diagnosis not present

## 2017-06-16 DIAGNOSIS — I10 Essential (primary) hypertension: Secondary | ICD-10-CM | POA: Diagnosis not present

## 2017-06-16 DIAGNOSIS — Z79899 Other long term (current) drug therapy: Secondary | ICD-10-CM | POA: Insufficient documentation

## 2017-06-16 DIAGNOSIS — J45909 Unspecified asthma, uncomplicated: Secondary | ICD-10-CM | POA: Diagnosis not present

## 2017-06-16 DIAGNOSIS — R1031 Right lower quadrant pain: Secondary | ICD-10-CM | POA: Diagnosis present

## 2017-06-16 DIAGNOSIS — G8929 Other chronic pain: Secondary | ICD-10-CM | POA: Insufficient documentation

## 2017-06-16 DIAGNOSIS — E119 Type 2 diabetes mellitus without complications: Secondary | ICD-10-CM | POA: Diagnosis not present

## 2017-06-16 DIAGNOSIS — R109 Unspecified abdominal pain: Secondary | ICD-10-CM

## 2017-06-16 LAB — LIPASE, BLOOD: Lipase: 30 U/L (ref 11–51)

## 2017-06-16 LAB — URINALYSIS, ROUTINE W REFLEX MICROSCOPIC
Bilirubin Urine: NEGATIVE
Glucose, UA: NEGATIVE mg/dL
Hgb urine dipstick: NEGATIVE
Ketones, ur: NEGATIVE mg/dL
Leukocytes, UA: NEGATIVE
Nitrite: NEGATIVE
Protein, ur: NEGATIVE mg/dL
Specific Gravity, Urine: 1.01 (ref 1.005–1.030)
pH: 8 (ref 5.0–8.0)

## 2017-06-16 LAB — BASIC METABOLIC PANEL
Anion gap: 5 (ref 5–15)
BUN: 8 mg/dL (ref 6–20)
CO2: 32 mmol/L (ref 22–32)
Calcium: 9.1 mg/dL (ref 8.9–10.3)
Chloride: 102 mmol/L (ref 101–111)
Creatinine, Ser: 0.58 mg/dL (ref 0.44–1.00)
GFR calc Af Amer: >60 mL/min (ref >60)
GFR calc non Af Amer: >60 mL/min (ref >60)
Glucose, Bld: 183 mg/dL — ABNORMAL HIGH (ref 65–99)
Potassium: 4.1 mmol/L (ref 3.5–5.1)
Sodium: 139 mmol/L (ref 135–145)

## 2017-06-16 LAB — HEPATIC FUNCTION PANEL
ALT: 30 U/L (ref 14–54)
AST: 45 U/L — ABNORMAL HIGH (ref 15–41)
Albumin: 3.3 g/dL — ABNORMAL LOW (ref 3.5–5.0)
Alkaline Phosphatase: 79 U/L (ref 38–126)
Bilirubin, Direct: 0.4 mg/dL (ref 0.1–0.5)
Indirect Bilirubin: 1 mg/dL — ABNORMAL HIGH (ref 0.3–0.9)
Total Bilirubin: 1.4 mg/dL — ABNORMAL HIGH (ref 0.3–1.2)
Total Protein: 6.4 g/dL — ABNORMAL LOW (ref 6.5–8.1)

## 2017-06-16 LAB — CBC
HCT: 39.7 % (ref 36.0–46.0)
Hemoglobin: 13.2 g/dL (ref 12.0–15.0)
MCH: 33.7 pg (ref 26.0–34.0)
MCHC: 33.2 g/dL (ref 30.0–36.0)
MCV: 101.3 fL — ABNORMAL HIGH (ref 78.0–100.0)
Platelets: 48 10*3/uL — ABNORMAL LOW (ref 150–400)
RBC: 3.92 MIL/uL (ref 3.87–5.11)
RDW: 14 % (ref 11.5–15.5)
WBC: 2.9 10*3/uL — ABNORMAL LOW (ref 4.0–10.5)

## 2017-06-16 LAB — I-STAT BETA HCG BLOOD, ED (MC, WL, AP ONLY): I-stat hCG, quantitative: <5 m[IU]/mL (ref <5)

## 2017-06-16 MED ORDER — SODIUM CHLORIDE 0.9 % IV BOLUS
500.0000 mL | Freq: Once | INTRAVENOUS | Status: AC
  Administered 2017-06-16: 500 mL via INTRAVENOUS

## 2017-06-16 MED ORDER — IOPAMIDOL (ISOVUE-300) INJECTION 61%
100.0000 mL | Freq: Once | INTRAVENOUS | Status: AC | PRN
  Administered 2017-06-16: 100 mL via INTRAVENOUS

## 2017-06-16 MED ORDER — DICYCLOMINE HCL 10 MG PO CAPS
20.0000 mg | ORAL_CAPSULE | Freq: Once | ORAL | Status: AC
  Administered 2017-06-16: 20 mg via ORAL
  Filled 2017-06-16: qty 2

## 2017-06-16 NOTE — ED Triage Notes (Signed)
Patient complaining of right flank pain. States she was admitted to hospital for same recently and left before they wanted to discharge her. Also complaining of dysuria.

## 2017-06-16 NOTE — ED Provider Notes (Signed)
[facility] EMERGENCY DEPARTMENT Provider Note   CSN: [id number] Arrival date & time: 06/16/17  1003     History   Chief Complaint Chief Complaint  Patient presents with  . Flank Pain    HPI Jordin Dambrosio is a 52 y.o. female.  HPI Patient recently hospitalized for questionable colitis.  Finished course of Cipro and Flagyl.  Discharged from the hospital 3 days ago.  States she has had worsening right lower quadrant pain that radiates up to her right upper quadrant and right flank.  No nausea or vomiting.  Has had normal bowel movement without evidence of grossly bloody or melanotic stools.  No fever or chills.  She is taking oxycodone at home for the pain without improvement of her symptoms. Past Medical History:  Diagnosis Date  . Abdominal abscess 08/25/2014  . Acid reflux 08/10/2010  . Acute cervical myofascial strain 02/09/2016  . Acute postoperative pain 08/09/2016  . Anxiety   . Ascites   . Asthma   . Back pain   . Bile leak, postoperative 07/17/2014  . Brittle bone disease   . Cancer ([place])    Uteriine  ca 11yr ago partial hysterectomy  . Cervical disc disease   . Chronic kidney disease   . Collagen vascular disease ([place]    RA  3-4 yrs ago  . COPD (chronic obstructive pulmonary disease) ([place]   . Diabetes mellitus without complication ([place]   . GERD (gastroesophageal reflux disease)   . Hypertension   . Hypothyroidism   . Left upper quadrant pain 01/09/2014  . Major depressive disorder with single episode 12/05/2011  . Major depressive disorder, single episode 12/05/2011  . Migraines   . NASH (nonalcoholic steatohepatitis)   . Respiratory infection    2/17  . Shock ([place] 09/18/2014  . Sleep apnea   . Sleep apnea   . Syncope 11/16/2014  . Thyroid disease   . TIA (transient ischemic attack)     Patient Active Problem List   Diagnosis Date Noted  . UTI (urinary tract infection) 06/11/2017  . Neurogenic pain 02/13/2017  . Chest pain 12/20/2016  . Chronic feet pain  (Secondary source of pain) (Bilateral) (R>L) 10/26/2016  . Acute hepatic encephalopathy 09/23/2016  . Low back pain due to L1-2 disc extrusion (caudal) (Left) 07/11/2016  . Abdominal pain 06/28/2016  . Constipation 06/20/2016  . Hyperglycemia 06/20/2016  . Nausea without vomiting 06/06/2016  . Chronic lower extremity pain (Bilateral) 05/19/2016  . Spinal stenosis, thoracic region (T10-11) 05/19/2016  . Diabetes mellitus, insulin dependent (IDDM), uncontrolled ([place] 04/12/2016  . Chronic sacroiliac joint pain (Bilateral) (L>R) 03/23/2016  . Chronic upper extremity pain (Left) 03/10/2016  . Chronic radicular cervical pain (L) 03/10/2016  . Osteoarthritis 02/24/2016  . Allodynia 02/09/2016  . Chronic pain syndrome 01/07/2016  . Opiate withdrawal ([place] 12/22/2015  . Elevated liver enzymes 09/28/2015  . Cirrhosis of liver without ascites ([place] 09/24/2015  . Cirrhosis ([place] 09/24/2015  . Altered mental status 07/24/2015  . Uncontrolled diabetes mellitus ([place] 07/24/2015  . Radicular pain of thoracic region 04/06/2015  . Hepatic encephalopathy ([place] 03/11/2015  . Elevated sedimentation rate 03/05/2015  . Elevated C-reactive protein (CRP) 03/05/2015  . Lumbar facet syndrome (Bilateral) (R>L) 03/05/2015  . Cervical spondylosis 03/05/2015  . Lumbar spondylosis 03/05/2015  . Encounter for chronic pain management 03/05/2015  . Chronic shoulder pain (Bilateral) (R>L) 03/05/2015  . Chronic carpal tunnel syndrome (Bilateral) 03/05/2015  . Chronic hip pain (Bilateral) (L>R) 03/05/2015  . Chronic upper back pain (  Bilateral) (L>R) 03/05/2015  . Osteoporosis, idiopathic 03/05/2015  . Abnormal MRI, lumbar spine (02/03/2015) 03/05/2015  . Thoracic radiculitis 02/05/2015  . Vitamin D deficiency 01/13/2015  . B12 deficiency 01/13/2015  . Folate deficiency 01/13/2015  . Subacute lumbar radiculopathy (left side) (S1 dermatome) 12/10/2014  . Drowsiness 11/16/2014  . Episode of syncope 11/16/2014  .  Somnolence 11/16/2014  . Opiate use (75 MME/Day) 10/28/2014  . Long term prescription opiate use 10/28/2014  . Long term current use of opiate analgesic 10/28/2014  . Encounter for therapeutic drug level monitoring 10/28/2014  . Chronic epigastric abdominal pain 10/28/2014  . Chronic low back pain (Primary Source of Pain) (Bilateral) (R>L) 10/28/2014  . Chronic neck pain [facility] source of pain) (Bilateral) (R>L) 10/28/2014  . Ascites 09/05/2014  . NASH (nonalcoholic steatohepatitis) 09/05/2014  . Hypokalemia 09/05/2014  . Dysthymia 08/05/2014  . Other social stressor 08/05/2014  . Steatohepatitis 07/15/2014  . Type 2 diabetes mellitus ([place]) 07/12/2014  . COPD with acute exacerbation ([place]) 07/12/2014  . GERD (gastroesophageal reflux disease) 07/12/2014  . OSA on CPAP 07/12/2014  . Anxiety 07/12/2014  . 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 Lumbar and sacral osteoarthritis 03/26/2014  . Myofascial pain 03/26/2014  . Lumbar spinal stenosis 03/26/2014  . Lumbosacral spondylosis without myelopathy 03/26/2014  . 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 Lumbar central spinal stenosis (L1-2 and L4-5) 03/26/2014  . Spondylosis of lumbar region without myelopathy or radiculopathy 03/26/2014  . Breath shortness 01/09/2014  . Diastolic dysfunction with chronic heart failure ([place]) 08/06/2013  . Airway hyperreactivity 08/06/2013  . Essential (primary) hypertension 08/06/2013  . Asthma 08/06/2013  . Clinical depression 12/05/2011    Past Surgical History:  Procedure Laterality Date  . ABDOMINAL HYSTERECTOMY    . CHOLECYSTECTOMY N/A 07/15/2014   Procedure: LAPAROSCOPIC CHOLECYSTECTOMY with liver biopsy ;  Surgeon: Sherri Rad, MD;  Location: [facility] ORS;  Service: General;  Laterality: N/A;  . COLONOSCOPY WITH PROPOFOL N/A 06/23/2014   Procedure: COLONOSCOPY WITH PROPOFOL;  Surgeon: Lollie Sails, MD;  Location: [facility] ENDOSCOPY;  Service: Endoscopy;  Laterality: N/A;  . ERCP  N/A 07/16/2014   Procedure: ENDOSCOPIC RETROGRADE CHOLANGIOPANCREATOGRAPHY (ERCP);  Surgeon: Clarene Essex, MD;  Location: [facility] ENDOSCOPY;  Service: Endoscopy;  Laterality: N/A;  . ERCP N/A 10/03/2014   Procedure: ENDOSCOPIC RETROGRADE CHOLANGIOPANCREATOGRAPHY (ERCP);  Surgeon: Hulen Luster, MD;  Location: [facility] ENDOSCOPY;  Service: Gastroenterology;  Laterality: N/A;  . ESOPHAGOGASTRODUODENOSCOPY N/A 06/23/2014   Procedure: ESOPHAGOGASTRODUODENOSCOPY (EGD);  Surgeon: Lollie Sails, MD;  Location: [facility] ENDOSCOPY;  Service: Endoscopy;  Laterality: N/A;  . ESOPHAGOGASTRODUODENOSCOPY N/A 06/30/2016   Procedure: ESOPHAGOGASTRODUODENOSCOPY (EGD);  Surgeon: Jonathon Bellows, MD;  Location: [facility] ENDOSCOPY;  Service: Endoscopy;  Laterality: N/A;  . ESOPHAGOGASTRODUODENOSCOPY (EGD) WITH PROPOFOL N/A 12/01/2015   Procedure: ESOPHAGOGASTRODUODENOSCOPY (EGD) WITH PROPOFOL;  Surgeon: Lollie Sails, MD;  Location: [facility] ENDOSCOPY;  Service: Endoscopy;  Laterality: N/A;  . TUBAL LIGATION    . WISDOM TOOTH EXTRACTION       OB History   None      Home Medications    Prior to Admission medications   Medication Sig Start Date End Date Taking? Authorizing Provider  albuterol (PROVENTIL HFA;VENTOLIN HFA) 108 (90 Base) MCG/ACT inhaler Inhale 2 puffs into the lungs every 6 (six) hours as needed for wheezing or shortness of breath.   Yes [provider]  ALPRAZolam Duanne Moron) 1 MG tablet Take 1 mg by mouth 3 (three) times daily.   Yes [provider]  cholecalciferol (VITAMIN D) 1000 units tablet Take 2,000 Units by mouth daily.   Yes [provider]  citalopram (CELEXA) 40 MG tablet Take 40 mg by mouth daily.  07/24/15  Yes [provider]  fluticasone (FLONASE) 50 MCG/ACT nasal spray Place 1-2 sprays into both nostrils daily.    Yes [provider]  Folate-B12-Intrinsic Factor (INTRINSI B12-FOLATE) 130-865-78 MCG-MCG-MG TABS Take 1 tablet by mouth daily.    Yes [provider]  furosemide (LASIX) 40 MG tablet Take 1 tablet (40 mg total) by mouth 2 (two) times daily. Patient taking differently: Take 40 mg by mouth 2 (two) times daily as needed for fluid.  10/26/16  Yes Gouru, Illene Silver, MD  gabapentin (NEURONTIN) 300 MG capsule Take 600 mg by mouth 4 (four) times daily.    Yes [provider]  insulin aspart (NOVOLOG) 100 UNIT/ML injection Inject 44 Units into the skin 3 (three) times daily with meals.   Yes [provider]  insulin detemir (LEVEMIR) 100 UNIT/ML injection Inject 0.25 mLs (25 Units total) into the skin 2 (two) times daily. Patient taking differently: Inject 96 Units into the skin 2 (two) times daily.  10/26/16  Yes Gouru, Aruna, MD  ipratropium-albuterol (DUONEB) 0.5-2.5 (3) MG/3ML SOLN Take 3 mLs by nebulization every 4 (four) hours as needed. For shortness of breath/wheezing   Yes [provider]  lactulose (CHRONULAC) 10 GM/15ML solution Take 30 mLs (20 g total) by mouth 3 (three) times daily. Patient taking differently: Take 40 g by mouth 4 (four) times daily.  10/26/16  Yes Gouru, Aruna, MD  lidocaine (LIDODERM) 5 % Place 1 patch onto the skin daily. Remove & Discard patch within 12 hours or as directed by MD Patient taking differently: Place 1 patch onto the skin daily as needed (FOR PAIN). Remove & Discard patch within 12 hours or as directed by MD 12/16/16  Yes Gregor Hams, MD  nystatin (MYCOSTATIN/NYSTOP) powder Apply 1 g topically 3 (three) times daily as needed. For irritation 10/26/16  Yes Gouru, Aruna, MD  Oxycodone HCl 10 MG TABS Take 10 mg by mouth 6 (six) times daily.   Yes [provider]  pantoprazole (PROTONIX) 40 MG tablet Take 40 mg by mouth 2 (two) times daily before a meal.   Yes [provider]  potassium chloride SA (K-DUR,KLOR-CON) 20 MEQ tablet Take 20 mEq by mouth daily as needed. For high blood pressure   Yes [provider]  promethazine (PHENERGAN) 12.5 MG  tablet Take 2 tablets (25 mg total) by mouth every 6 (six) hours as needed for nausea or vomiting. Patient taking differently: Take 12.5-25 mg by mouth every 6 (six) hours as needed for nausea or vomiting.  09/25/16  Yes Dustin Flock, MD  rifaximin (XIFAXAN) 550 MG TABS tablet Take 550 mg by mouth daily.    Yes [provider]  rizatriptan (MAXALT) 10 MG tablet Take 10 mg by mouth as needed for migraine.    Yes [provider]  spironolactone (ALDACTONE) 100 MG tablet Take 100 mg by mouth daily.    Yes [provider]  vitamin B-12 (CYANOCOBALAMIN) 1000 MCG tablet Take 2,000 mcg by mouth daily.   Yes [provider]  ciprofloxacin (CIPRO) 500 MG tablet TAKE 1 TABLET BY MOUTH EVERY 12 HOURS FOR 7 DAYS 04/30/17   [provider]  diclofenac sodium (VOLTAREN) 1 % GEL Apply 4 g topically every 8 (eight) hours as needed. Patient taking differently: Apply 4 g topically  every 8 (eight) hours as needed (FOR PAIN).  12/06/16 06/11/17  Milinda Pointer, MD  ibuprofen (ADVIL,MOTRIN) 600 MG tablet Take 1 tablet (600 mg total) by mouth every 8 (eight) hours as needed. Patient not taking: Reported on 06/11/2017 04/29/17   Darel Hong, MD  SUMAtriptan (IMITREX) 50 MG tablet Take 50 mg by mouth every 2 (two) hours as needed. For migraines  03/17/11  [provider]    Family History Family History  Problem Relation Age of Onset  . Lung cancer Mother   . Ulcers Father   . Emphysema Father   . Heart disease Sister   . Ulcers Sister   . Heart disease Brother     Social History Social History   Tobacco Use  . Smoking status: Never Smoker  . Smokeless tobacco: Never Used  Substance Use Topics  . Alcohol use: No    Comment: occ  . Drug use: No     Allergies   Hydrocodone-acetaminophen; Nsaids; Tape; and Vicodin [hydrocodone-acetaminophen]   Review of Systems Review of Systems  Constitutional: Negative for chills and fever.  Eyes: Negative  for visual disturbance.  Respiratory: Negative for cough and shortness of breath.   Cardiovascular: Negative for chest pain.  Gastrointestinal: Positive for abdominal distention and abdominal pain. Negative for blood in stool, constipation, diarrhea, nausea and vomiting.  Genitourinary: Positive for flank pain. Negative for difficulty urinating, dysuria and hematuria.  Musculoskeletal: Positive for back pain and myalgias. Negative for neck pain and neck stiffness.  Skin: Negative for rash and wound.  Neurological: Negative for dizziness, weakness, light-headedness, numbness and headaches.  All other systems reviewed and are negative.    Physical Exam Updated Vital Signs BP (!) 101/52   Pulse 83   Temp 98 F (36.7 C) (Oral)   Resp 20   Ht 5' 2"  (1.575 m)   Wt 120.7 kg (266 lb)   SpO2 96%   BMI 48.65 kg/m   Physical Exam  Constitutional: She is oriented to person, place, and time. She appears well-developed and well-nourished.  HENT:  Head: Normocephalic and atraumatic.  Mouth/Throat: Oropharynx is clear and moist. No oropharyngeal exudate.  Eyes: Pupils are equal, round, and reactive to light. EOM are normal.  Neck: Normal range of motion. Neck supple.  Cardiovascular: Normal rate and regular rhythm. Exam reveals no gallop and no friction rub.  No murmur heard. Pulmonary/Chest: Effort normal and breath sounds normal. No stridor. No respiratory distress. She has no wheezes. She has no rales. She exhibits no tenderness.  Abdominal: Soft. Bowel sounds are normal. There is tenderness. There is no rebound and no guarding.  Patient has tenderness to palpation in the right lower quadrant and right upper quadrant.  No definite rebound or guarding.  Musculoskeletal: Normal range of motion. She exhibits no edema or tenderness.  Mild right CVA tenderness.  No lower extremity swelling, asymmetry or tenderness.  Distal pulses intact.  Neurological: She is alert and oriented to person, place,  and time.  Moves all extremities without focal deficit.  Sensation intact.  Skin: Skin is warm and dry. Capillary refill takes less than 2 seconds. No rash noted. No erythema.  Psychiatric: Her behavior is normal.  Very anxious appearing  Nursing note and vitals reviewed.    ED Treatments / Results  Labs (all labs ordered are listed, but only abnormal results are displayed) Labs Reviewed  CBC - Abnormal; Notable for the following components:      Result Value   WBC 2.9 (*)  MCV 101.3 (*)    Platelets 48 (*)    All other components within normal limits  BASIC METABOLIC PANEL - Abnormal; Notable for the following components:   Glucose, Bld 183 (*)    All other components within normal limits  HEPATIC FUNCTION PANEL - Abnormal; Notable for the following components:   Total Protein 6.4 (*)    Albumin 3.3 (*)    AST 45 (*)    Total Bilirubin 1.4 (*)    Indirect Bilirubin 1.0 (*)    All other components within normal limits  URINALYSIS, ROUTINE W REFLEX MICROSCOPIC  LIPASE, BLOOD  I-STAT BETA HCG BLOOD, ED ([facility], [facility], [facility] ONLY)    EKG None  Radiology Ct Abdomen Pelvis W Contrast  Result Date: 06/16/2017 CLINICAL DATA:  Abdominal and RIGHT flank pain, dysuria, question appendicitis, history uterine cancer 28 years ago,, chin vascular disease, COPD, GERD, asthma, type II diabetes mellitus essential hypertension EXAM: CT ABDOMEN AND PELVIS WITH CONTRAST TECHNIQUE: Multidetector CT imaging of the abdomen and pelvis was performed using the standard protocol following bolus administration of intravenous contrast. Sagittal and coronal MPR images reconstructed from axial data set. CONTRAST:  184m ISOVUE-300 IOPAMIDOL (ISOVUE-300) INJECTION 61% IV. No oral contrast. COMPARISON:  06/11/2017 FINDINGS: Lower chest: Minimal subsegmental atelectasis in the lower lobes Hepatobiliary: Cirrhotic nodular appearing liver with stable probable cyst in lateral segment LEFT lobe adjacent to falciform  fissure, 2.1 x 1.8 cm. Additional tiny cyst lateral segment LEFT lobe image 27. Gallbladder surgically absent. Pneumobilia likely due to prior ERCP with sphincterotomy. Pancreas: Normal appearance Spleen: Mild splenic enlargement with spleen measuring 13.8 x 6.8 x 20.6 cm (volume = 1000 cm^3). No focal splenic lesions. Adrenals/Urinary Tract: Adrenal glands, kidneys, ureters, and bladder normal appearance Stomach/Bowel: Normal appendix. Stomach and bowel loops normal appearance for technique Vascular/Lymphatic: Aorta normal caliber. Minimal atherosclerotic calcification aorta. No adenopathy. Reproductive: Uterus surgically absent.  Unremarkable ovaries Other: Minimal nonspecific free pelvic fluid. Minimal stranding in the retroperitoneum and mesentery no free intraperitoneal air. Appears unchanged. Musculoskeletal: Osseous structures unremarkable. IMPRESSION: Cirrhotic appearing liver with splenomegaly and stable LEFT lobe hepatic cysts. Pneumobilia likely representing prior ERCP. No acute intra-abdominal or intrapelvic abnormalities. No significant interval change. Electronically Signed   By: MLavonia DanaM.D.   On: 06/16/2017 14:50    Procedures Procedures (including critical care time)  Medications Ordered in ED Medications  sodium chloride 0.9 % bolus 500 mL (0 mLs Intravenous Stopped 06/16/17 1518)  iopamidol (ISOVUE-300) 61 % injection 100 mL (100 mLs Intravenous Contrast Given 06/16/17 1431)  sodium chloride 0.9 % bolus 500 mL (0 mLs Intravenous Stopped 06/16/17 1632)  dicyclomine (BENTYL) capsule 20 mg (20 mg Oral Given 06/16/17 1519)     Initial Impression / Assessment and Plan / ED Course  I have reviewed the triage vital signs and the nursing notes.  Pertinent labs & imaging results that were available during my care of the patient were reviewed by me and considered in my medical decision making (see chart for details).     Review of patient's previous CT scan.  There was some concern for  stranding in the mesentery adjacent to the appendiceal tip.  Given patient is having pain in that area,  Will repeat CT scan to evaluate for abscess or progression of inflammation. CT without acute findings.  Patient is well-appearing.  Borderline systolic blood pressure improving with IV fluids.  Has an appointment to see her pain management specialist on Monday.  Return precautions given. Final Clinical  Impressions(s) / ED Diagnoses   Final diagnoses:  Chronic abdominal pain    ED Discharge Orders    None       Julianne Rice, MD 06/18/17 1213

## 2017-07-10 ENCOUNTER — Emergency Department: Payer: BLUE CROSS/BLUE SHIELD

## 2017-07-10 ENCOUNTER — Emergency Department
Admission: EM | Admit: 2017-07-10 | Discharge: 2017-07-10 | Disposition: A | Discharge status: Home / Self Care | Payer: BLUE CROSS/BLUE SHIELD | Attending: Student in an Organized Health Care Education/Training Program | Admitting: Student in an Organized Health Care Education/Training Program

## 2017-07-10 ENCOUNTER — Other Ambulatory Visit: Payer: Self-pay

## 2017-07-10 ENCOUNTER — Encounter: Payer: Self-pay | Admitting: *Deleted

## 2017-07-10 DIAGNOSIS — G8929 Other chronic pain: Secondary | ICD-10-CM | POA: Insufficient documentation

## 2017-07-10 DIAGNOSIS — E1122 Type 2 diabetes mellitus with diabetic chronic kidney disease: Secondary | ICD-10-CM | POA: Insufficient documentation

## 2017-07-10 DIAGNOSIS — R0789 Other chest pain: Secondary | ICD-10-CM | POA: Insufficient documentation

## 2017-07-10 DIAGNOSIS — N189 Chronic kidney disease, unspecified: Secondary | ICD-10-CM | POA: Diagnosis not present

## 2017-07-10 DIAGNOSIS — Z8673 Personal history of transient ischemic attack (TIA), and cerebral infarction without residual deficits: Secondary | ICD-10-CM | POA: Diagnosis not present

## 2017-07-10 DIAGNOSIS — I5030 Unspecified diastolic (congestive) heart failure: Secondary | ICD-10-CM | POA: Insufficient documentation

## 2017-07-10 DIAGNOSIS — Z794 Long term (current) use of insulin: Secondary | ICD-10-CM | POA: Diagnosis not present

## 2017-07-10 DIAGNOSIS — Z79899 Other long term (current) drug therapy: Secondary | ICD-10-CM | POA: Diagnosis not present

## 2017-07-10 DIAGNOSIS — R52 Pain, unspecified: Secondary | ICD-10-CM

## 2017-07-10 DIAGNOSIS — R1013 Epigastric pain: Secondary | ICD-10-CM | POA: Insufficient documentation

## 2017-07-10 DIAGNOSIS — R1110 Vomiting, unspecified: Secondary | ICD-10-CM | POA: Insufficient documentation

## 2017-07-10 DIAGNOSIS — R1011 Right upper quadrant pain: Secondary | ICD-10-CM | POA: Insufficient documentation

## 2017-07-10 DIAGNOSIS — Z8542 Personal history of malignant neoplasm of other parts of uterus: Secondary | ICD-10-CM | POA: Insufficient documentation

## 2017-07-10 DIAGNOSIS — R109 Unspecified abdominal pain: Secondary | ICD-10-CM

## 2017-07-10 DIAGNOSIS — J449 Chronic obstructive pulmonary disease, unspecified: Secondary | ICD-10-CM | POA: Insufficient documentation

## 2017-07-10 DIAGNOSIS — I130 Hypertensive heart and chronic kidney disease with heart failure and stage 1 through stage 4 chronic kidney disease, or unspecified chronic kidney disease: Secondary | ICD-10-CM | POA: Insufficient documentation

## 2017-07-10 DIAGNOSIS — R079 Chest pain, unspecified: Secondary | ICD-10-CM

## 2017-07-10 DIAGNOSIS — E039 Hypothyroidism, unspecified: Secondary | ICD-10-CM | POA: Diagnosis not present

## 2017-07-10 LAB — HEPATIC FUNCTION PANEL
ALT: 39 U/L (ref 0–44)
AST: 57 U/L — ABNORMAL HIGH (ref 15–41)
Albumin: 3.4 g/dL — ABNORMAL LOW (ref 3.5–5.0)
Alkaline Phosphatase: 122 U/L (ref 38–126)
Bilirubin, Direct: 0.4 mg/dL — ABNORMAL HIGH (ref 0.0–0.2)
Indirect Bilirubin: 0.8 mg/dL (ref 0.3–0.9)
Total Bilirubin: 1.2 mg/dL (ref 0.3–1.2)
Total Protein: 6.6 g/dL (ref 6.5–8.1)

## 2017-07-10 LAB — CBC
HCT: 40.4 % (ref 35.0–47.0)
Hemoglobin: 14.4 g/dL (ref 12.0–16.0)
MCH: 35.5 pg — ABNORMAL HIGH (ref 26.0–34.0)
MCHC: 35.7 g/dL (ref 32.0–36.0)
MCV: 99.7 fL (ref 80.0–100.0)
Platelets: 72 10*3/uL — ABNORMAL LOW (ref 150–440)
RBC: 4.06 MIL/uL (ref 3.80–5.20)
RDW: 13.9 % (ref 11.5–14.5)
WBC: 3.5 10*3/uL — ABNORMAL LOW (ref 3.6–11.0)

## 2017-07-10 LAB — BASIC METABOLIC PANEL
Anion gap: 9 (ref 5–15)
BUN: 6 mg/dL (ref 6–20)
CO2: 25 mmol/L (ref 22–32)
Calcium: 9.1 mg/dL (ref 8.9–10.3)
Chloride: 101 mmol/L (ref 98–111)
Creatinine, Ser: 0.65 mg/dL (ref 0.44–1.00)
GFR calc Af Amer: >60 mL/min (ref >60)
GFR calc non Af Amer: >60 mL/min (ref >60)
Glucose, Bld: 364 mg/dL — ABNORMAL HIGH (ref 70–99)
Potassium: 3.8 mmol/L (ref 3.5–5.1)
Sodium: 135 mmol/L (ref 135–145)

## 2017-07-10 LAB — TROPONIN I
Troponin I: <0.03 ng/mL (ref <0.03)
Troponin I: <0.03 ng/mL (ref <0.03)

## 2017-07-10 LAB — PROTIME-INR
INR: 1.23
Prothrombin Time: 15.4 seconds — ABNORMAL HIGH (ref 11.4–15.2)

## 2017-07-10 LAB — AMMONIA: Ammonia: 72 umol/L — ABNORMAL HIGH (ref 9–35)

## 2017-07-10 MED ORDER — ONDANSETRON 8 MG PO TBDP
ORAL_TABLET | ORAL | Status: AC
  Filled 2017-07-10: qty 1

## 2017-07-10 MED ORDER — TRAMADOL HCL 50 MG PO TABS: 50.0000 mg | ORAL_TABLET | Freq: Four times a day (QID) | ORAL | 0 refills | Status: AC | PRN

## 2017-07-10 MED ORDER — ONDANSETRON 4 MG PO TBDP
4.0000 mg | ORAL_TABLET | Freq: Once | ORAL | Status: AC
  Administered 2017-07-10: 4 mg via ORAL

## 2017-07-10 MED ORDER — LACTULOSE 10 GM/15ML PO SOLN
30.0000 g | Freq: Once | ORAL | Status: AC
  Administered 2017-07-10: 30 g via ORAL
  Filled 2017-07-10: qty 60

## 2017-07-10 MED ORDER — OXYCODONE-ACETAMINOPHEN 5-325 MG PO TABS
1.0000 | ORAL_TABLET | Freq: Once | ORAL | Status: AC
  Administered 2017-07-10: 1 via ORAL
  Filled 2017-07-10: qty 1

## 2017-07-10 MED ORDER — LORAZEPAM 1 MG PO TABS
1.0000 mg | ORAL_TABLET | Freq: Once | ORAL | Status: AC
  Administered 2017-07-10: 1 mg via ORAL
  Filled 2017-07-10: qty 1

## 2017-07-10 MED ORDER — IOHEXOL 300 MG/ML  SOLN
100.0000 mL | Freq: Once | INTRAMUSCULAR | Status: AC | PRN
  Administered 2017-07-10: 100 mL via INTRAVENOUS
  Filled 2017-07-10: qty 100

## 2017-07-10 MED ORDER — PROMETHAZINE HCL 25 MG/ML IJ SOLN
12.5000 mg | Freq: Four times a day (QID) | INTRAMUSCULAR | Status: DC | PRN
  Administered 2017-07-10: 12.5 mg via INTRAVENOUS
  Filled 2017-07-10: qty 1

## 2017-07-10 NOTE — Discharge Instructions (Addendum)

## 2017-07-10 NOTE — ED Notes (Signed)
Pt had an episode of vomiting in triage.

## 2017-07-10 NOTE — ED Triage Notes (Addendum)
Pt to ED reporting generalized chest pains. Pt has a hx of panic attacks and reports feeling increased anxiety at this time as well. Nausea reported as well as SOB. Dizziness, lightheadedness also reported.

## 2017-07-10 NOTE — ED Provider Notes (Signed)
[facility] Emergency Department Provider Note    First MD Initiated Contact with Patient 07/10/17 1430     (approximate)  I have reviewed the triage vital signs and the nursing notes.   HISTORY  Chief Complaint Chest Pain and Panic Attack    HPI Lori Mckenzie is a 52 y.o. female extensive past medical history recent hospitalization for colitis presents to the ER with generalized chest pain as well as epigastric and right upper quadrant abdominal pain.  Patient states that she was feeling very stressed and anxious feeling she was having a panic attack.  She was actually writing with her husband on the way to his vascular surgery appointment.  They had to pull over twice for her to vomit.  Is nonbilious and on bloody emesis.  Denies any lower abdominal pain.  No diarrhea.  Nuys any diaphoresis.  No fevers or chills.  No numbness or tingling.  States that pain in her epigastric area is mild and states that it feels like her "NASH liver" discomfort.   Past Medical History:  Diagnosis Date  . Abdominal abscess 08/25/2014  . Acid reflux 08/10/2010  . Acute cervical myofascial strain 02/09/2016  . Acute postoperative pain 08/09/2016  . Anxiety   . Ascites   . Asthma   . Back pain   . Bile leak, postoperative 07/17/2014  . Brittle bone disease   . Cancer ([place])    Uteriine  ca 63yr ago partial hysterectomy  . Cervical disc disease   . Chronic kidney disease   . Collagen vascular disease ([place]    RA  3-4 yrs ago  . COPD (chronic obstructive pulmonary disease) ([place]   . Diabetes mellitus without complication ([place]   . GERD (gastroesophageal reflux disease)   . Hypertension   . Hypothyroidism   . Left upper quadrant pain 01/09/2014  . Major depressive disorder with single episode 12/05/2011  . Major depressive disorder, single episode 12/05/2011  . Migraines   . NASH (nonalcoholic steatohepatitis)   . Respiratory infection    2/17  . Shock ([place] 09/18/2014  . Sleep  apnea   . Sleep apnea   . Syncope 11/16/2014  . Thyroid disease   . TIA (transient ischemic attack)    Family History  Problem Relation Age of Onset  . Lung cancer Mother   . Ulcers Father   . Emphysema Father   . Heart disease Sister   . Ulcers Sister   . Heart disease Brother    Past Surgical History:  Procedure Laterality Date  . ABDOMINAL HYSTERECTOMY    . CHOLECYSTECTOMY N/A 07/15/2014   Procedure: LAPAROSCOPIC CHOLECYSTECTOMY with liver biopsy ;  Surgeon: MSherri Rad MD;  Location: [facility] ORS;  Service: General;  Laterality: N/A;  . COLONOSCOPY WITH PROPOFOL N/A 06/23/2014   Procedure: COLONOSCOPY WITH PROPOFOL;  Surgeon: MLollie Sails MD;  [facility];  Service: Endoscopy;  Laterality: N/A;  . ERCP N/A 07/16/2014   Procedure: ENDOSCOPIC RETROGRADE CHOLANGIOPANCREATOGRAPHY (ERCP);  Surgeon: MClarene Essex MD;  [facility];  Service: Endoscopy;  Laterality: N/A;  . ERCP N/A 10/03/2014   Procedure: ENDOSCOPIC RETROGRADE CHOLANGIOPANCREATOGRAPHY (ERCP);  Surgeon: PHulen Luster MD;  [facility];  Service: Gastroenterology;  Laterality: N/A;  . ESOPHAGOGASTRODUODENOSCOPY N/A 06/23/2014   Procedure: ESOPHAGOGASTRODUODENOSCOPY (EGD);  Surgeon: MLollie Sails MD;  [facility];  Service: Endoscopy;  Laterality: N/A;  . ESOPHAGOGASTRODUODENOSCOPY N/A 06/30/2016   Procedure: ESOPHAGOGASTRODUODENOSCOPY (EGD);  Surgeon: [name] MD;  Location:  [place] ENDOSCOPY;  Service: Endoscopy;  Laterality: N/A;  . ESOPHAGOGASTRODUODENOSCOPY (EGD) WITH PROPOFOL N/A 12/01/2015   Procedure: ESOPHAGOGASTRODUODENOSCOPY (EGD) WITH PROPOFOL;  Surgeon: Lollie Sails, MD;  Location: [facility] ENDOSCOPY;  Service: Endoscopy;  Laterality: N/A;  . TUBAL LIGATION    . WISDOM TOOTH EXTRACTION     Patient Active Problem List   Diagnosis Date Noted  . UTI (urinary tract infection) 06/11/2017  . Neurogenic pain 02/13/2017  . Chest pain 12/20/2016  . Chronic feet pain  (Secondary source of pain) (Bilateral) (R>L) 10/26/2016  . Acute hepatic encephalopathy 09/23/2016  . Low back pain due to L1-2 disc extrusion (caudal) (Left) 07/11/2016  . Abdominal pain 06/28/2016  . Constipation 06/20/2016  . Hyperglycemia 06/20/2016  . Nausea without vomiting 06/06/2016  . Chronic lower extremity pain (Bilateral) 05/19/2016  . Spinal stenosis, thoracic region (T10-11) 05/19/2016  . Diabetes mellitus, insulin dependent (IDDM), uncontrolled ([place]) 04/12/2016  . Chronic sacroiliac joint pain (Bilateral) (L>R) 03/23/2016  . Chronic upper extremity pain (Left) 03/10/2016  . Chronic radicular cervical pain (L) 03/10/2016  . Osteoarthritis 02/24/2016  . Allodynia 02/09/2016  . Chronic pain syndrome 01/07/2016  . Opiate withdrawal ([place]) 12/22/2015  . Elevated liver enzymes 09/28/2015  . Cirrhosis of liver without ascites ([place]) 09/24/2015  . Cirrhosis ([place]) 09/24/2015  . Altered mental status 07/24/2015  . Uncontrolled diabetes mellitus ([place]) 07/24/2015  . Radicular pain of thoracic region 04/06/2015  . Hepatic encephalopathy ([place]) 03/11/2015  . Elevated sedimentation rate 03/05/2015  . Elevated C-reactive protein (CRP) 03/05/2015  . Lumbar facet syndrome (Bilateral) (R>L) 03/05/2015  . Cervical spondylosis 03/05/2015  . Lumbar spondylosis 03/05/2015  . Encounter for chronic pain management 03/05/2015  . Chronic shoulder pain (Bilateral) (R>L) 03/05/2015  . Chronic carpal tunnel syndrome (Bilateral) 03/05/2015  . Chronic hip pain (Bilateral) (L>R) 03/05/2015  . Chronic upper back pain (Bilateral) (L>R) 03/05/2015  . Osteoporosis, idiopathic 03/05/2015  . Abnormal MRI, lumbar spine (02/03/2015) 03/05/2015  . Thoracic radiculitis 02/05/2015  . Vitamin D deficiency 01/13/2015  . B12 deficiency 01/13/2015  . Folate deficiency 01/13/2015  . Subacute lumbar radiculopathy (left side) (S1 dermatome) 12/10/2014  . Drowsiness 11/16/2014  . Episode of syncope 11/16/2014  .  Somnolence 11/16/2014  . Opiate use (75 MME/Day) 10/28/2014  . Long term prescription opiate use 10/28/2014  . Long term current use of opiate analgesic 10/28/2014  . Encounter for therapeutic drug level monitoring 10/28/2014  . Chronic epigastric abdominal pain 10/28/2014  . Chronic low back pain (Primary Source of Pain) (Bilateral) (R>L) 10/28/2014  . Chronic neck pain [facility] source of pain) (Bilateral) (R>L) 10/28/2014  . Ascites 09/05/2014  . NASH (nonalcoholic steatohepatitis) 09/05/2014  . Hypokalemia 09/05/2014  . Dysthymia 08/05/2014  . Other social stressor 08/05/2014  . Steatohepatitis 07/15/2014  . Type 2 diabetes mellitus ([place]) 07/12/2014  . COPD with acute exacerbation ([place]) 07/12/2014  . GERD (gastroesophageal reflux disease) 07/12/2014  . OSA on CPAP 07/12/2014  . Anxiety 07/12/2014  . 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 Lumbar and sacral osteoarthritis 03/26/2014  . Myofascial pain 03/26/2014  . Lumbar spinal stenosis 03/26/2014  . Lumbosacral spondylosis without myelopathy 03/26/2014  . 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 Lumbar central spinal stenosis (L1-2 and L4-5) 03/26/2014  . Spondylosis of lumbar region without myelopathy or radiculopathy 03/26/2014  . Breath shortness 01/09/2014  . Diastolic dysfunction with chronic heart failure ([place]) 08/06/2013  . Airway hyperreactivity 08/06/2013  . Essential (primary) hypertension 08/06/2013  . Asthma 08/06/2013  .  Clinical depression 12/05/2011      Prior to Admission medications   Medication Sig Start Date End Date Taking? Authorizing Provider  albuterol (PROVENTIL HFA;VENTOLIN HFA) 108 (90 Base) MCG/ACT inhaler Inhale 2 puffs into the lungs every 6 (six) hours as needed for wheezing or shortness of breath.    [provider]  ALPRAZolam Duanne Moron) 1 MG tablet Take 1 mg by mouth 3 (three) times daily.    [provider]  cholecalciferol (VITAMIN D) 1000 units tablet Take  2,000 Units by mouth daily.    [provider]  ciprofloxacin (CIPRO) 500 MG tablet TAKE 1 TABLET BY MOUTH EVERY 12 HOURS FOR 7 DAYS 04/30/17   [provider]  citalopram (CELEXA) 40 MG tablet Take 40 mg by mouth daily.  07/24/15   [provider]  diclofenac sodium (VOLTAREN) 1 % GEL Apply 4 g topically every 8 (eight) hours as needed. Patient taking differently: Apply 4 g topically every 8 (eight) hours as needed (FOR PAIN).  12/06/16 06/11/17  Milinda Pointer, MD  fluticasone (FLONASE) 50 MCG/ACT nasal spray Place 1-2 sprays into both nostrils daily.     [provider]  Folate-B12-Intrinsic Factor (INTRINSI B12-FOLATE) [phone] MCG-MCG-MG TABS Take 1 tablet by mouth daily.     [provider]  furosemide (LASIX) 40 MG tablet Take 1 tablet (40 mg total) by mouth 2 (two) times daily. Patient taking differently: Take 40 mg by mouth 2 (two) times daily as needed for fluid.  10/26/16   Nicholes Mango, MD  gabapentin (NEURONTIN) 300 MG capsule Take 600 mg by mouth 4 (four) times daily.     [provider]  ibuprofen (ADVIL,MOTRIN) 600 MG tablet Take 1 tablet (600 mg total) by mouth every 8 (eight) hours as needed. Patient not taking: Reported on 06/11/2017 04/29/17   Darel Hong, MD  insulin aspart (NOVOLOG) 100 UNIT/ML injection Inject 44 Units into the skin 3 (three) times daily with meals.    [provider]  insulin detemir (LEVEMIR) 100 UNIT/ML injection Inject 0.25 mLs (25 Units total) into the skin 2 (two) times daily. Patient taking differently: Inject 96 Units into the skin 2 (two) times daily.  10/26/16   Gouru, Illene Silver, MD  ipratropium-albuterol (DUONEB) 0.5-2.5 (3) MG/3ML SOLN Take 3 mLs by nebulization every 4 (four) hours as needed. For shortness of breath/wheezing    [provider]  lactulose (CHRONULAC) 10 GM/15ML solution Take 30 mLs (20 g total) by mouth 3 (three) times daily. Patient taking differently: Take 40 g  by mouth 4 (four) times daily.  10/26/16   Gouru, Illene Silver, MD  lidocaine (LIDODERM) 5 % Place 1 patch onto the skin daily. Remove & Discard patch within 12 hours or as directed by MD Patient taking differently: Place 1 patch onto the skin daily as needed (FOR PAIN). Remove & Discard patch within 12 hours or as directed by MD 12/16/16   Gregor Hams, MD  nystatin (MYCOSTATIN/NYSTOP) powder Apply 1 g topically 3 (three) times daily as needed. For irritation 10/26/16   Gouru, Illene Silver, MD  Oxycodone HCl 10 MG TABS Take 10 mg by mouth 6 (six) times daily.    [provider]  pantoprazole (PROTONIX) 40 MG tablet Take 40 mg by mouth 2 (two) times daily before a meal.    [provider]  potassium chloride SA (K-DUR,KLOR-CON) 20 MEQ tablet Take 20 mEq by mouth daily as needed. For high blood pressure    [provider]  promethazine (PHENERGAN)  12.5 MG tablet Take 2 tablets (25 mg total) by mouth every 6 (six) hours as needed for nausea or vomiting. Patient taking differently: Take 12.5-25 mg by mouth every 6 (six) hours as needed for nausea or vomiting.  09/25/16   Dustin Flock, MD  rifaximin (XIFAXAN) 550 MG TABS tablet Take 550 mg by mouth daily.     [provider]  rizatriptan (MAXALT) 10 MG tablet Take 10 mg by mouth as needed for migraine.     [provider]  spironolactone (ALDACTONE) 100 MG tablet Take 100 mg by mouth daily.     [provider]  vitamin B-12 (CYANOCOBALAMIN) 1000 MCG tablet Take 2,000 mcg by mouth daily.    [provider]  SUMAtriptan (IMITREX) 50 MG tablet Take 50 mg by mouth every 2 (two) hours as needed. For migraines  03/17/11  [provider]    Allergies Hydrocodone-acetaminophen; Nsaids; Tape; and Vicodin [hydrocodone-acetaminophen]    Social History Social History   Tobacco Use  . Smoking status: Never Smoker  . Smokeless tobacco: Never Used  Substance Use Topics  . Alcohol use: No     Comment: occ  . Drug use: No    Review of Systems Patient denies headaches, rhinorrhea, blurry vision, numbness, shortness of breath, chest pain, edema, cough, abdominal pain, nausea, vomiting, diarrhea, dysuria, fevers, rashes or hallucinations unless otherwise stated above in HPI. ____________________________________________   PHYSICAL EXAM:  VITAL SIGNS: Vitals:   07/10/17 1304  BP: 126/76  Pulse: (!) 101  Resp: (!) 22  Temp: 97.8 F (36.6 C)  SpO2: 97%    Constitutional: Alert and oriented.  Eyes: Conjunctivae are normal.  Head: Atraumatic. Nose: No congestion/rhinnorhea. Mouth/Throat: Mucous membranes are moist.   Neck: No stridor. Painless ROM.  Cardiovascular: Normal rate, regular rhythm. Grossly normal heart sounds.  Good peripheral circulation. Respiratory: Normal respiratory effort.  No retractions. Lungs with coarse expiratory wheeze throughotu Gastrointestinal: Soft with mild epigastric ttp. No distention. No abdominal bruits. No CVA tenderness. Genitourinary: deferred Musculoskeletal: No lower extremity tenderness nor edema.  No joint effusions. Neurologic:  Normal speech and language. No gross focal neurologic deficits are appreciated. No facial droop Skin:  Skin is warm, dry and intact. No rash noted. Psychiatric: anxious and tearful  ____________________________________________   LABS (all labs ordered are listed, but only abnormal results are displayed)  Results for orders placed or performed during the hospital encounter of 07/10/17 (from the past 24 hour(s))  Basic metabolic panel     Status: Abnormal   Collection Time: 07/10/17  1:15 PM  Result Value Ref Range   Sodium 135 135 - 145 mmol/L   Potassium 3.8 3.5 - 5.1 mmol/L   Chloride 101 98 - 111 mmol/L   CO2 25 22 - 32 mmol/L   Glucose, Bld 364 (H) 70 - 99 mg/dL   BUN 6 6 - 20 mg/dL   Creatinine, Ser 0.65 0.44 - 1.00 mg/dL   Calcium 9.1 8.9 - 10.3 mg/dL   GFR calc non Af Amer >60 >60 mL/min    GFR calc Af Amer >60 >60 mL/min   Anion gap 9 5 - 15  CBC     Status: Abnormal   Collection Time: 07/10/17  1:15 PM  Result Value Ref Range   WBC 3.5 (L) 3.6 - 11.0 K/uL   RBC 4.06 3.80 - 5.20 MIL/uL   Hemoglobin 14.4 12.0 - 16.0 g/dL   HCT 40.4 35.0 - 47.0 %   MCV 99.7 80.0 -  100.0 fL   MCH 35.5 (H) 26.0 - 34.0 pg   MCHC 35.7 32.0 - 36.0 g/dL   RDW 13.9 11.5 - 14.5 %   Platelets 72 (L) 150 - 440 K/uL  Troponin I     Status: None   Collection Time: 07/10/17  1:15 PM  Result Value Ref Range   Troponin I <0.03 <0.03 ng/mL   ____________________________________________  EKG My review and personal interpretation at Time: 13:01   Indication: chest pain  Rate: 102  Rhythm: sinus Axis: normal Other: poor r wave progression, no stemi, normal intervals ____________________________________________  RADIOLOGY  I personally reviewed all radiographic images ordered to evaluate for the above acute complaints and reviewed radiology reports and findings.  These findings were personally discussed with the patient.  Please see medical record for radiology report.  ____________________________________________   PROCEDURES  Procedure(s) performed:  Procedures    Critical Care performed: no ____________________________________________   INITIAL IMPRESSION / ASSESSMENT AND PLAN / ED COURSE  Pertinent labs & imaging results that were available during my care of the patient were reviewed by me and considered in my medical decision making (see chart for details).   DDX: Enteritis, gastritis, hepatitis, thrombus, ACS, pneumonia, perforation  Rushie Brazel is a 52 y.o. who presents to the ED with symptoms as described above.  Patient nontoxic-appearing but with very complicated past medical history including [place] cirrhosis.  Blood work sent for the above differential appears roughly at baseline.  Clinical Course as of Jul 10 1899  Mon Jul 10, 2017  1640 Work relatively at baseline.  Ammonia  level is slightly elevated will reduce lactulose.   [PR]  1750 Patient now dry heaving again.  Will order CT imaging to exclude obstructive pattern, evidence of portal venous thrombosis or other intra-abdominal process.   [PR]  8588 CT imaging shows no evidence of inflammatory or infectious process.  No evidence of portal vein thrombosis.  Patient up ambulating and in no acute distress.  Ammonia is elevated but she does not have any signs of significant hepatic encephalopathy.  She is tolerating lactulose.  At this point do believe patient stable and appropriate for outpatient follow-up.   [PR]    Clinical Course User Index [PR] Merlyn Lot, MD     As part of my medical decision making, I reviewed the following data within the [place] notes reviewed and incorporated, Labs reviewed, notes from prior ED visits and [place] Controlled Substance Database   ____________________________________________   FINAL CLINICAL IMPRESSION(S) / ED DIAGNOSES  Final diagnoses:  Chest pain, unspecified type  Chronic abdominal pain      NEW MEDICATIONS STARTED DURING THIS VISIT:  New Prescriptions   No medications on file     Note:  This document was prepared using Dragon voice recognition software and may include unintentional dictation errors.    Merlyn Lot, MD 07/10/17 1901

## 2017-07-12 ENCOUNTER — Other Ambulatory Visit: Payer: Self-pay | Admitting: Gastroenterology

## 2017-07-12 DIAGNOSIS — R1084 Generalized abdominal pain: Secondary | ICD-10-CM

## 2017-07-12 DIAGNOSIS — K7460 Unspecified cirrhosis of liver: Secondary | ICD-10-CM

## 2017-07-27 ENCOUNTER — Ambulatory Visit: Payer: BLUE CROSS/BLUE SHIELD

## 2017-09-04 IMAGING — CR DG HIP (WITH OR WITHOUT PELVIS) 2-3V*L*
1 series · 4 of 4 positions shown · non-contrast
Comparison: Abdomen pelvis CT dated 06/24/2015.

CLINICAL DATA: Bilateral hip pain and limited range of motion for
the past 10 years. No known injury.

EXAM:
DG HIP (WITH OR WITHOUT PELVIS) 2-3V LEFT

[Series 1: dg hip unilat w or w/o pelvis 2-3 views  · non-contrast · 0.14mm/px · 4 of 4 slices shown]
[im 1/4]
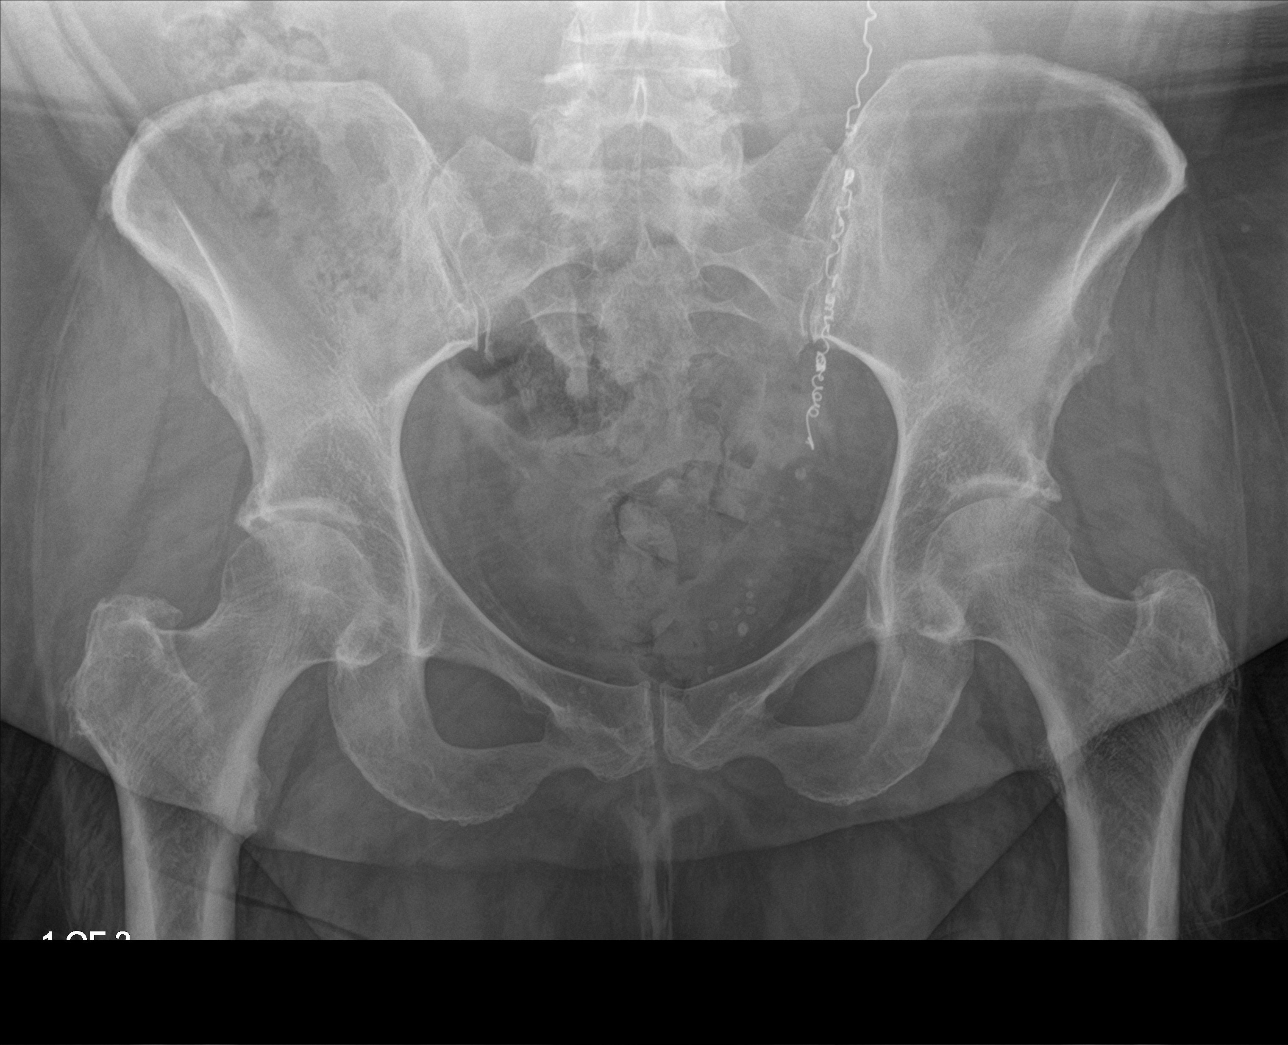
[im 2/4]
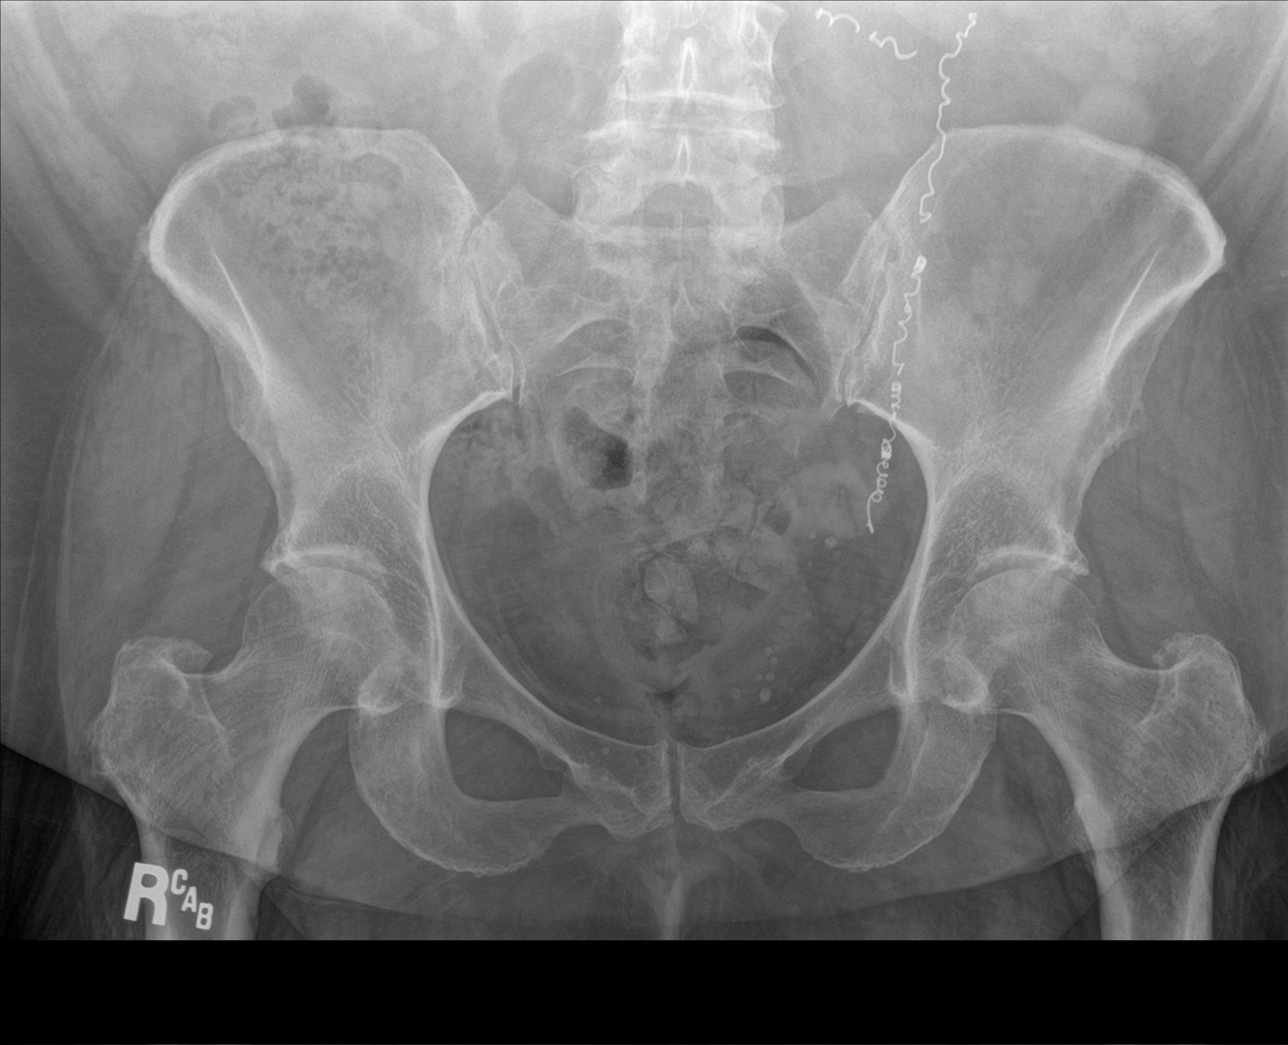
[im 3/4]
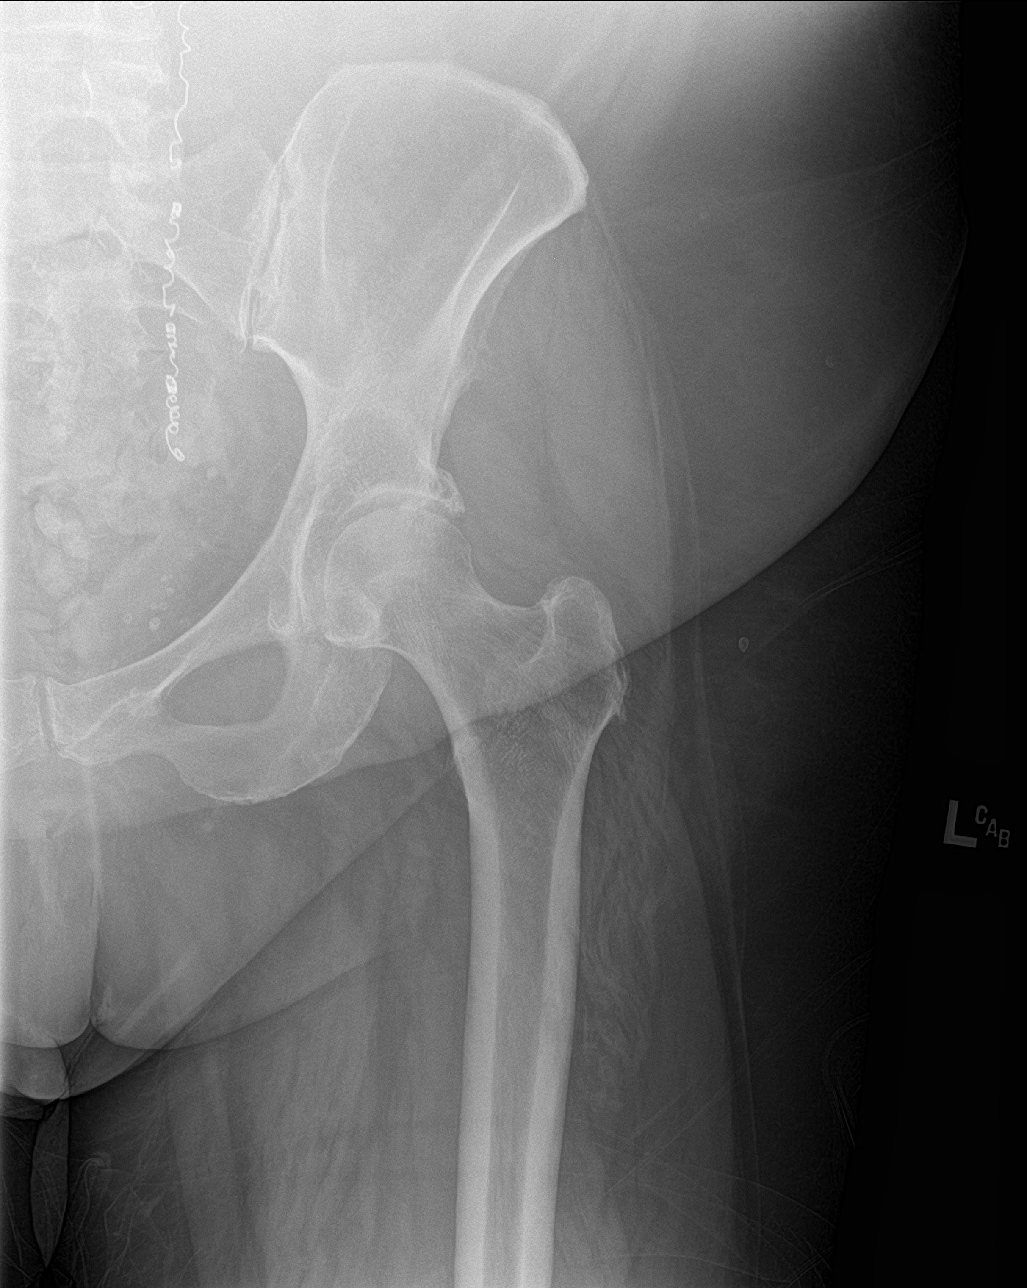
[im 4/4]
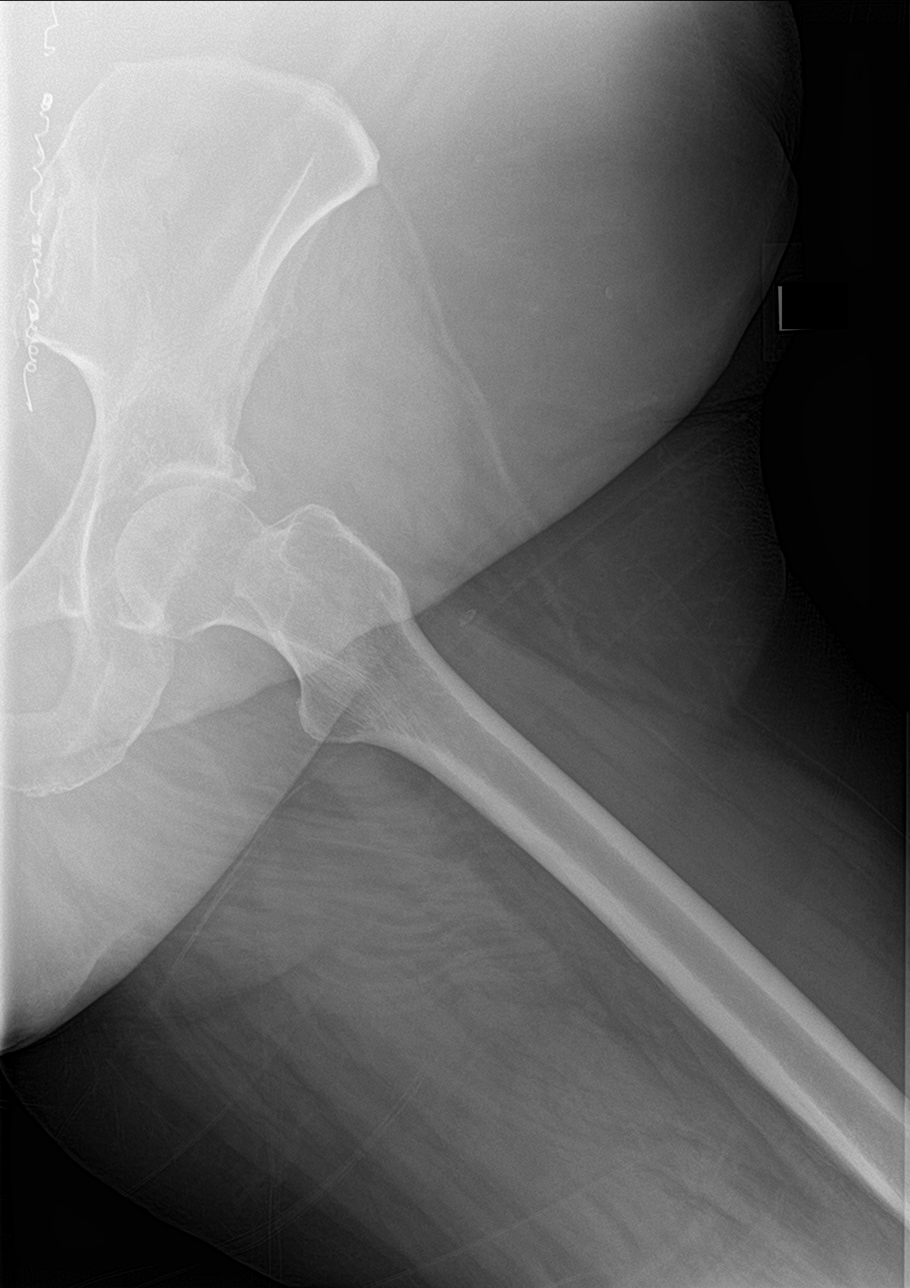

[4 of 4 positions shown; findings below may reference images not displayed]

FINDINGS: Curvilinear metallic densities in the left pelvis and lower abdomen,
corresponding to the inferior epigastric artery embolization coils
on the previous CT. The visualized bowel gas pattern is normal.
Bilateral pelvic phleboliths. Unremarkable bones.
IMPRESSION: No acute abnormality.

## 2017-09-04 IMAGING — CR DG HIP (WITH OR WITHOUT PELVIS) 2-3V*R*
1 series · 2 of 2 positions shown · non-contrast
Comparison: CT scan the abdomen pelvis of June 24, 2015

CLINICAL DATA: Chronic bilateral hip pain for at least the past 10
years. No known injury

EXAM:
DG HIP (WITH OR WITHOUT PELVIS) 2-3V RIGHT

[Series 1: dg hip unilat w or w/o pelvis 2-3 views  · non-contrast · 0.14mm/px · 2 of 2 slices shown]
[im 1/2]
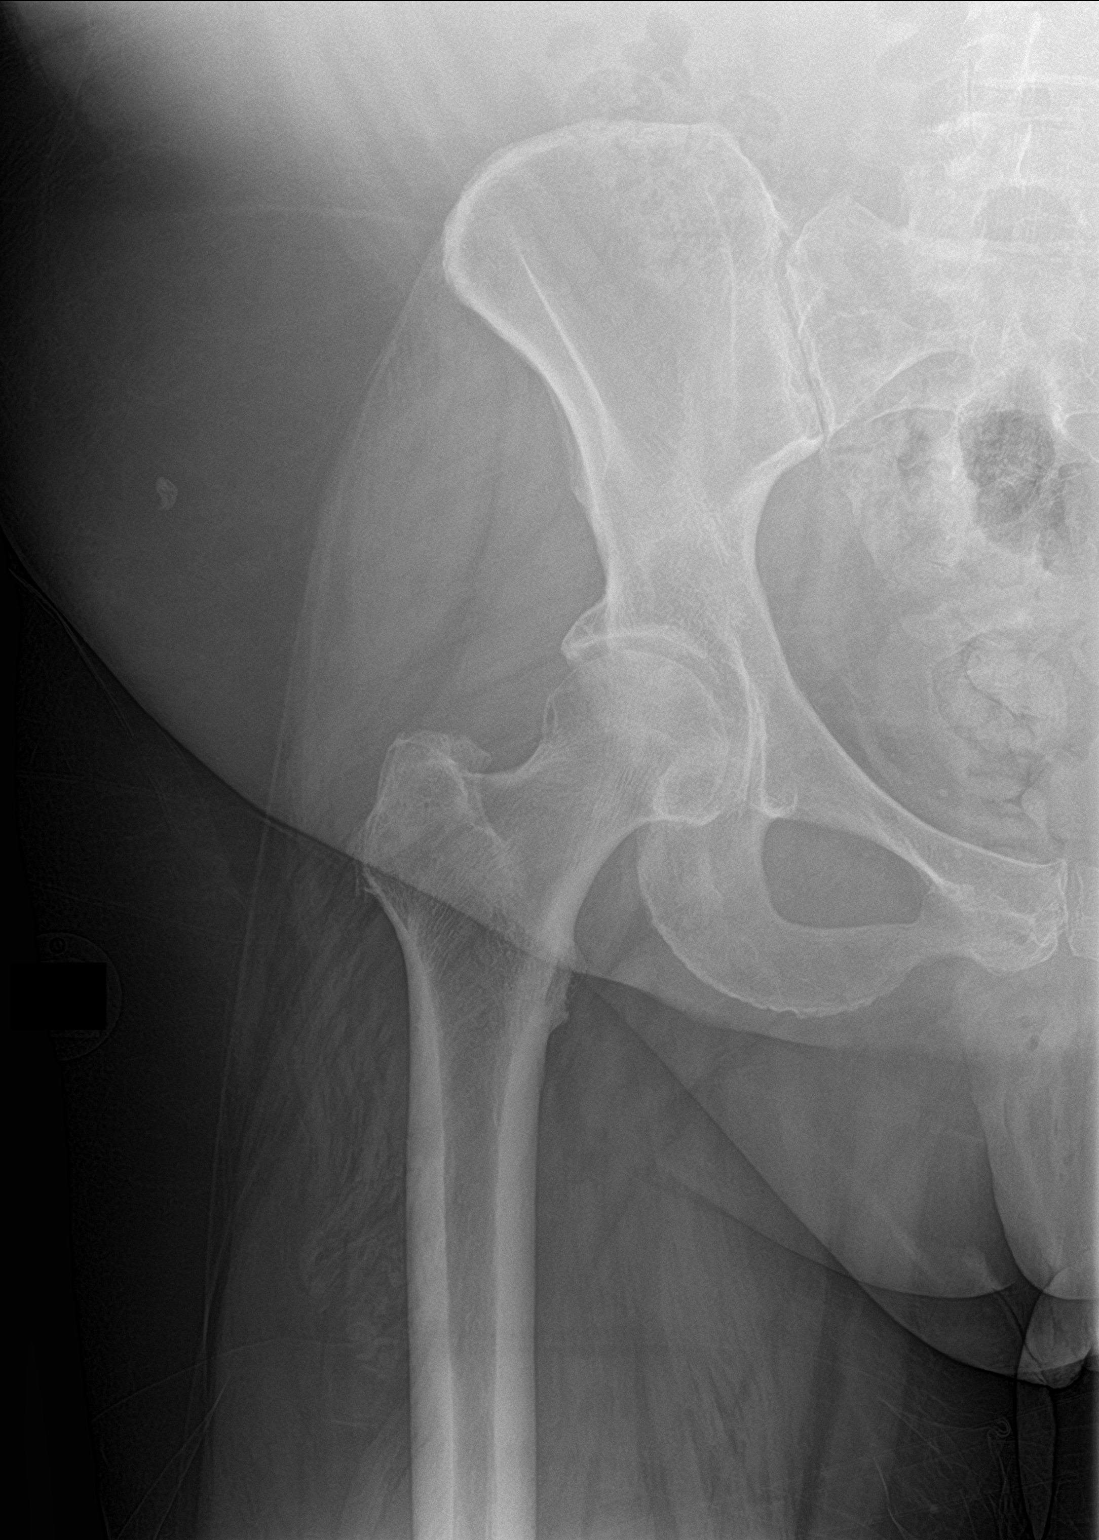
[im 2/2]
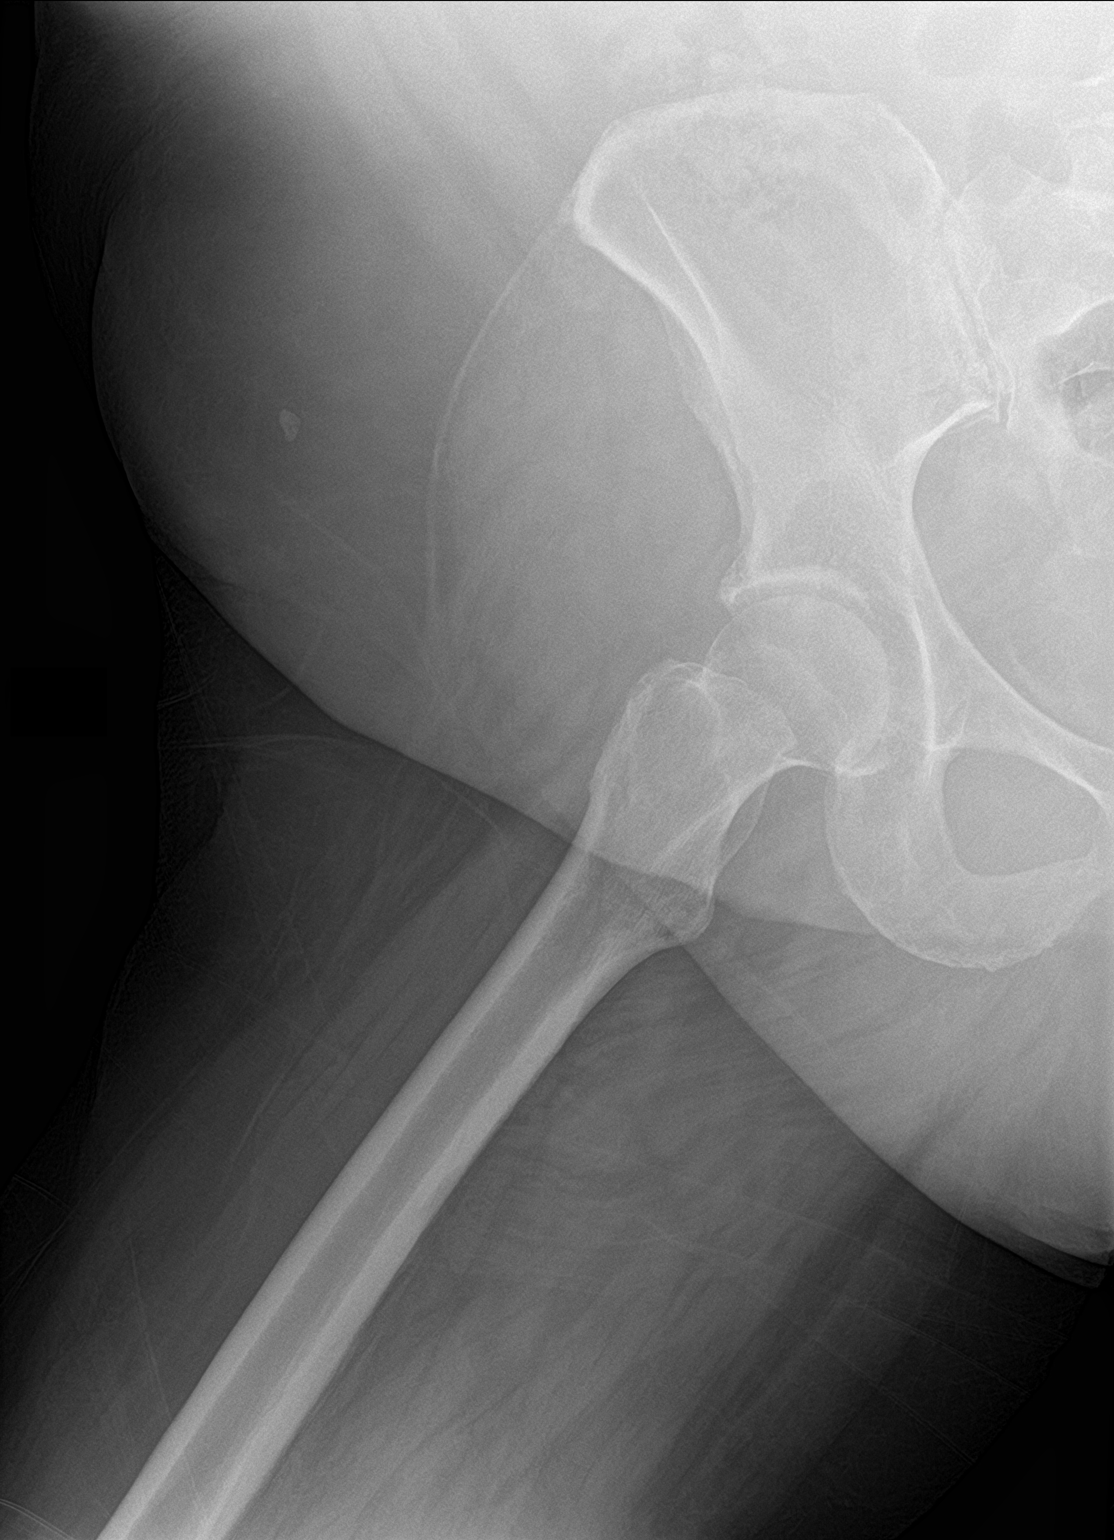

[2 of 2 positions shown; findings below may reference images not displayed]

FINDINGS: The right hemipelvis is subjectively adequately mineralized. No
lytic or blastic bony lesion is observed. AP and lateral views of
the right hip reveal preservation of the joint space. The articular
surfaces of the femoral head and acetabulum remains smoothly
rounded. The femoral neck, intertrochanteric, and subtrochanteric
regions are normal.
IMPRESSION: There is no acute or significant chronic bony abnormality of the
right hip.

## 2017-11-25 IMAGING — CR DG CHEST 2V
2 series · 2 of 2 positions shown · non-contrast
Comparison: Chest x-ray of 06/24/2015

CLINICAL DATA: Short of breath, chest pain today

EXAM:
CHEST  2 VIEW

[chest pa]
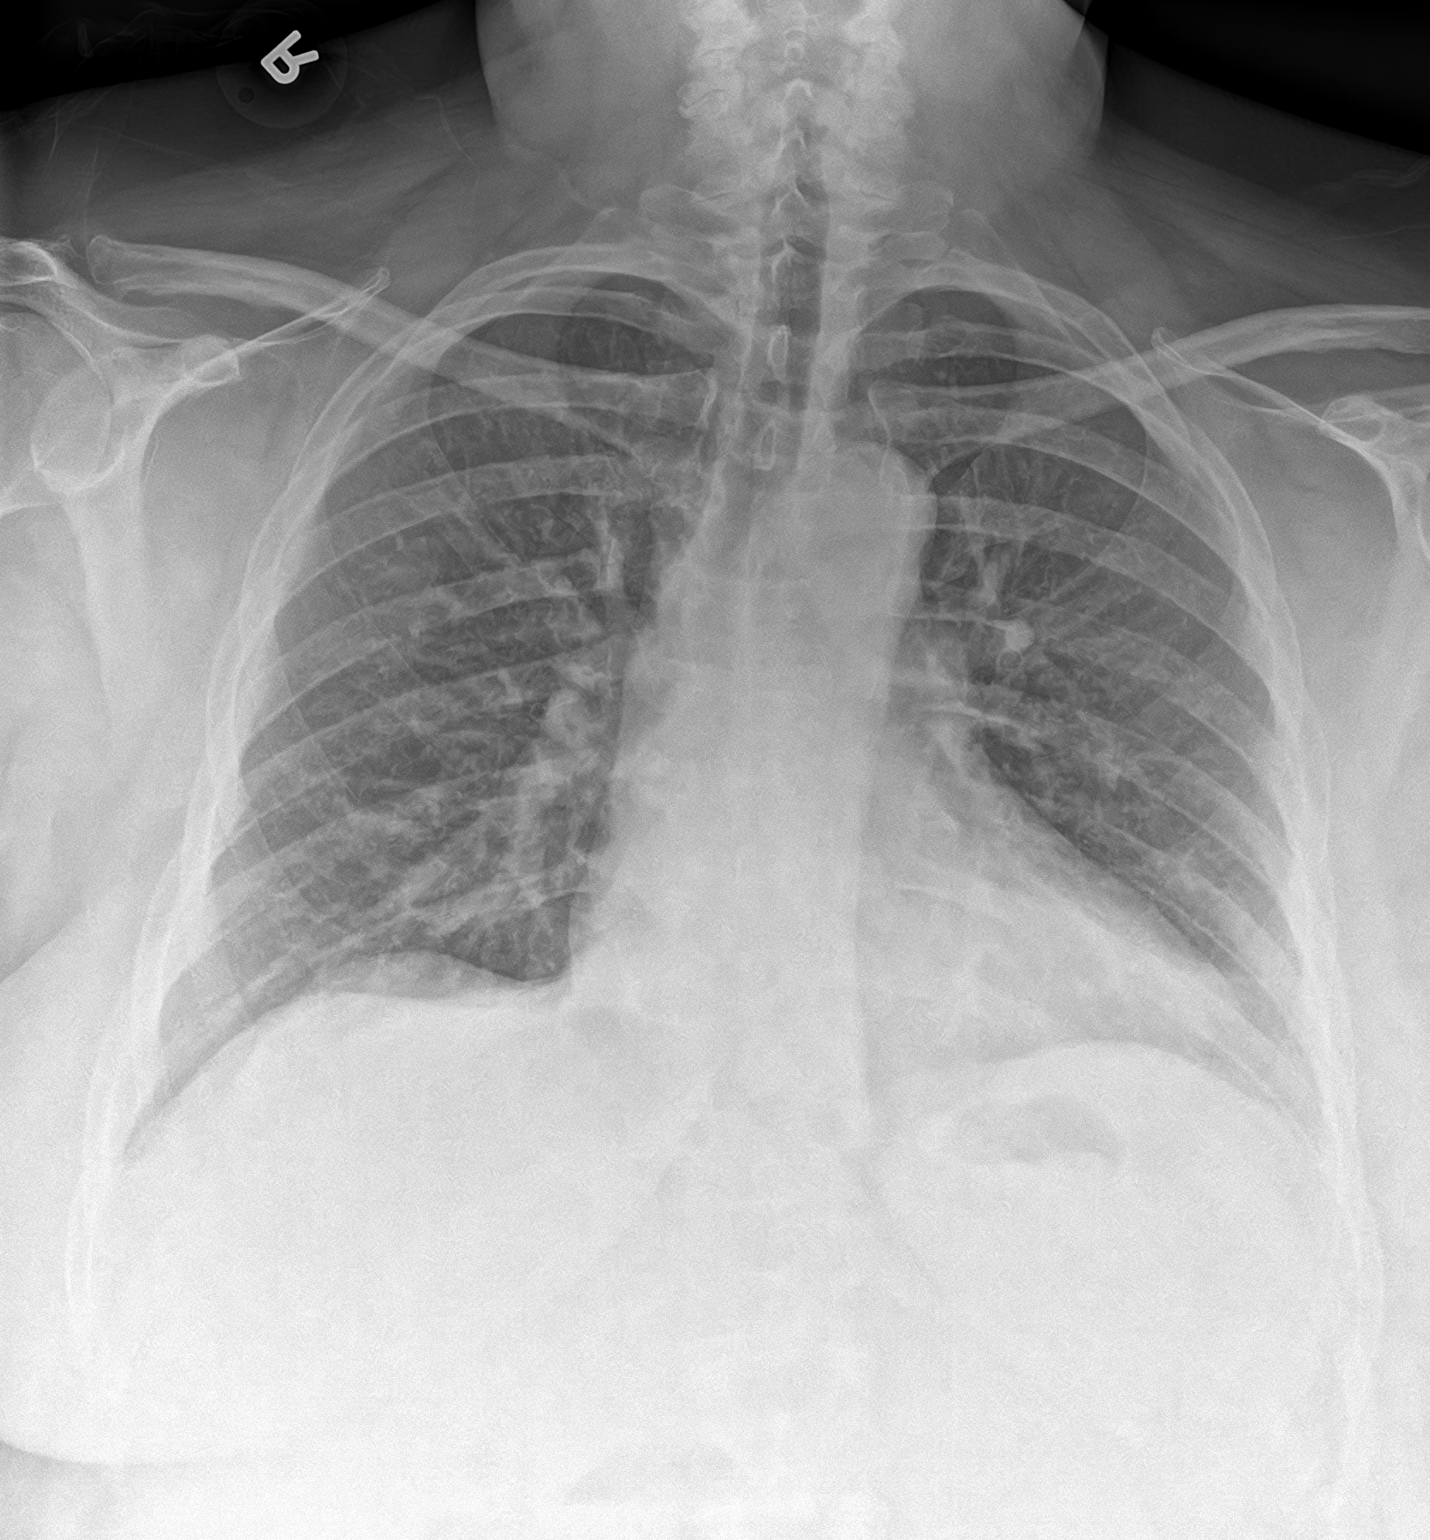

[chest lat]
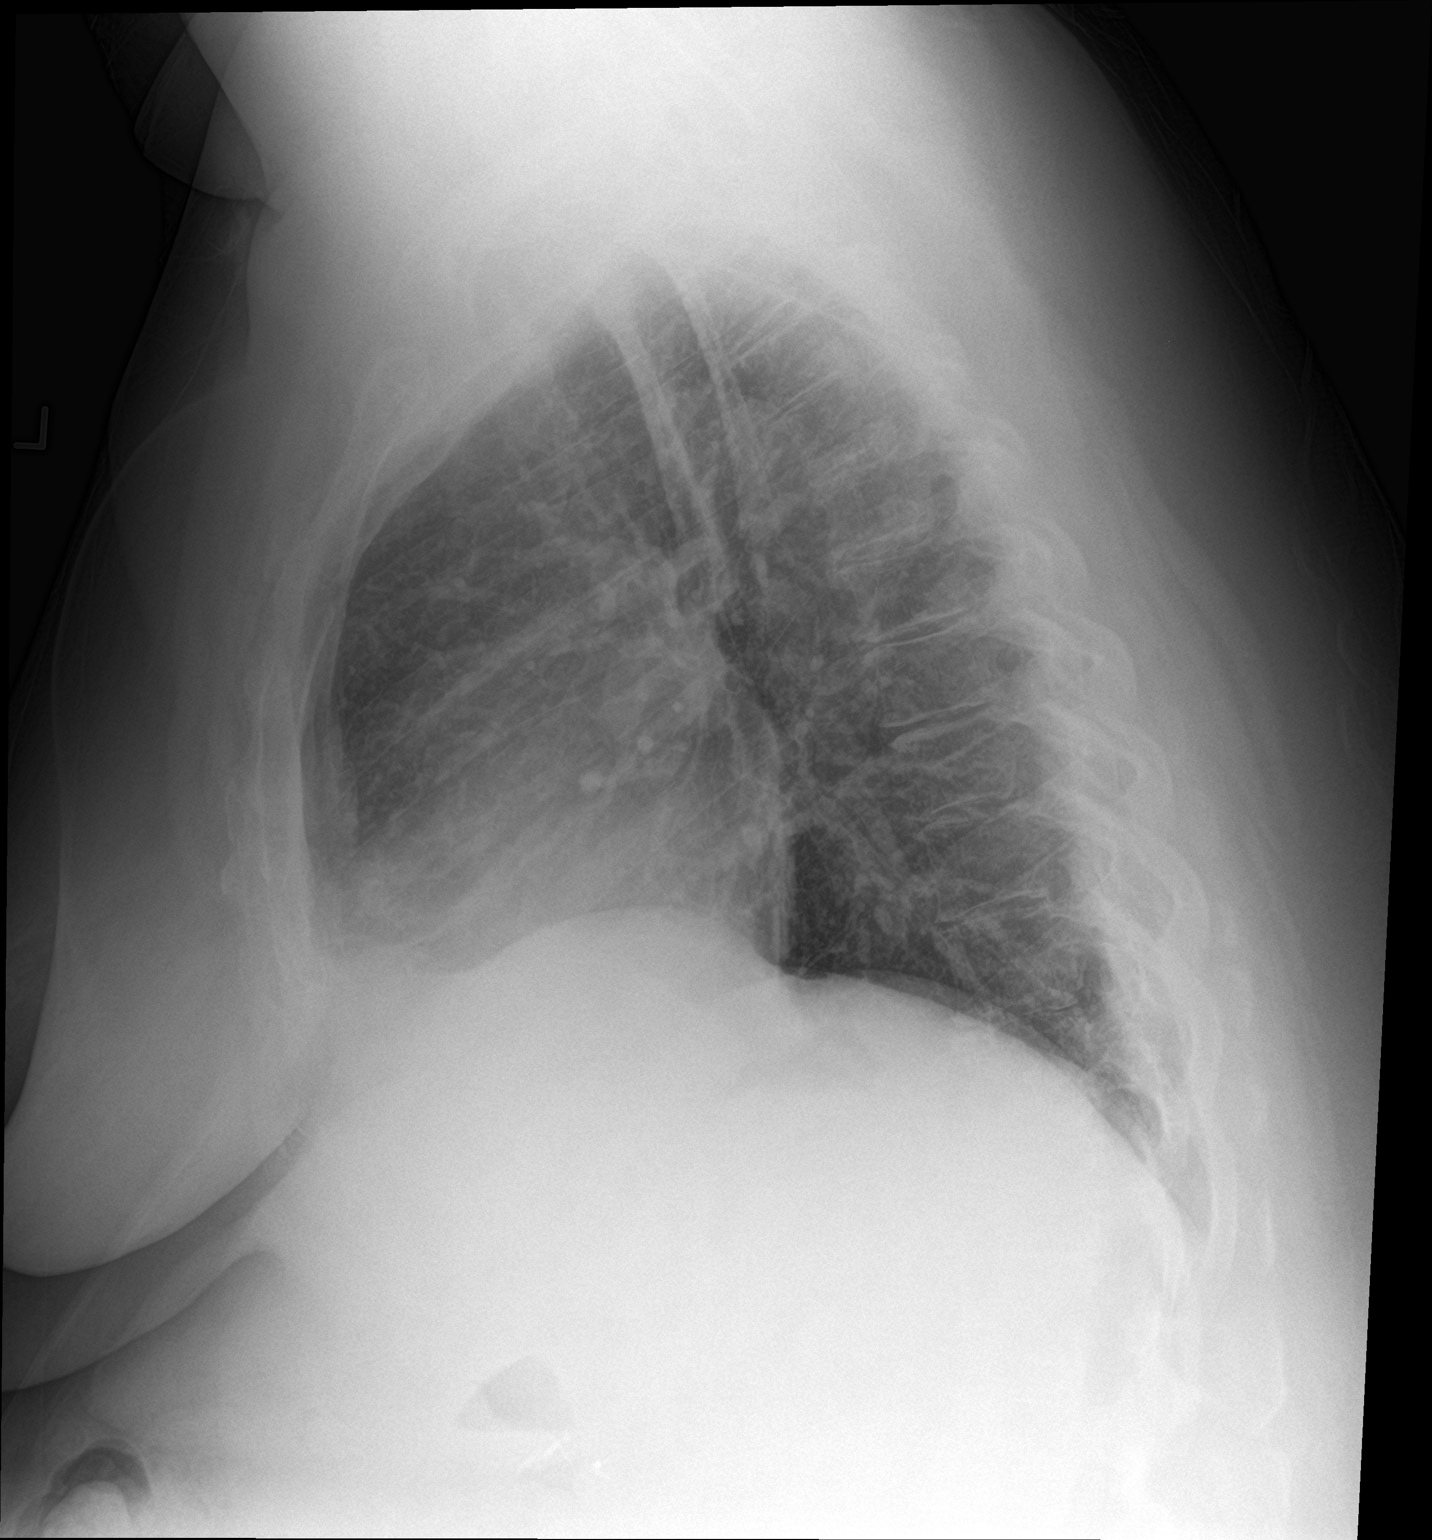

[2 of 2 positions shown; findings below may reference images not displayed]

FINDINGS: Somewhat prominent interstitial markings may well be chronic in
nature. No definite active process is seen. No pneumonia or effusion
is noted. Mediastinal and hilar contours are unremarkable and
cardiomegaly is stable. No acute bony abnormality is noted.
IMPRESSION: Probable mild chronic interstitial change. No definite active
process. Stable mild cardiomegaly

## 2018-01-16 IMAGING — CR DG CHEST 2V
1 series · 2 of 2 positions shown · non-contrast
Comparison: Prior radiograph from 07/12/2016.

CLINICAL DATA: Initial evaluation for acute shortness of breath.

EXAM:
CHEST  2 VIEW

[Series 1: dg chest 2 view · 0.14mm/px · 2 of 2 slices shown]
[im 1/2]
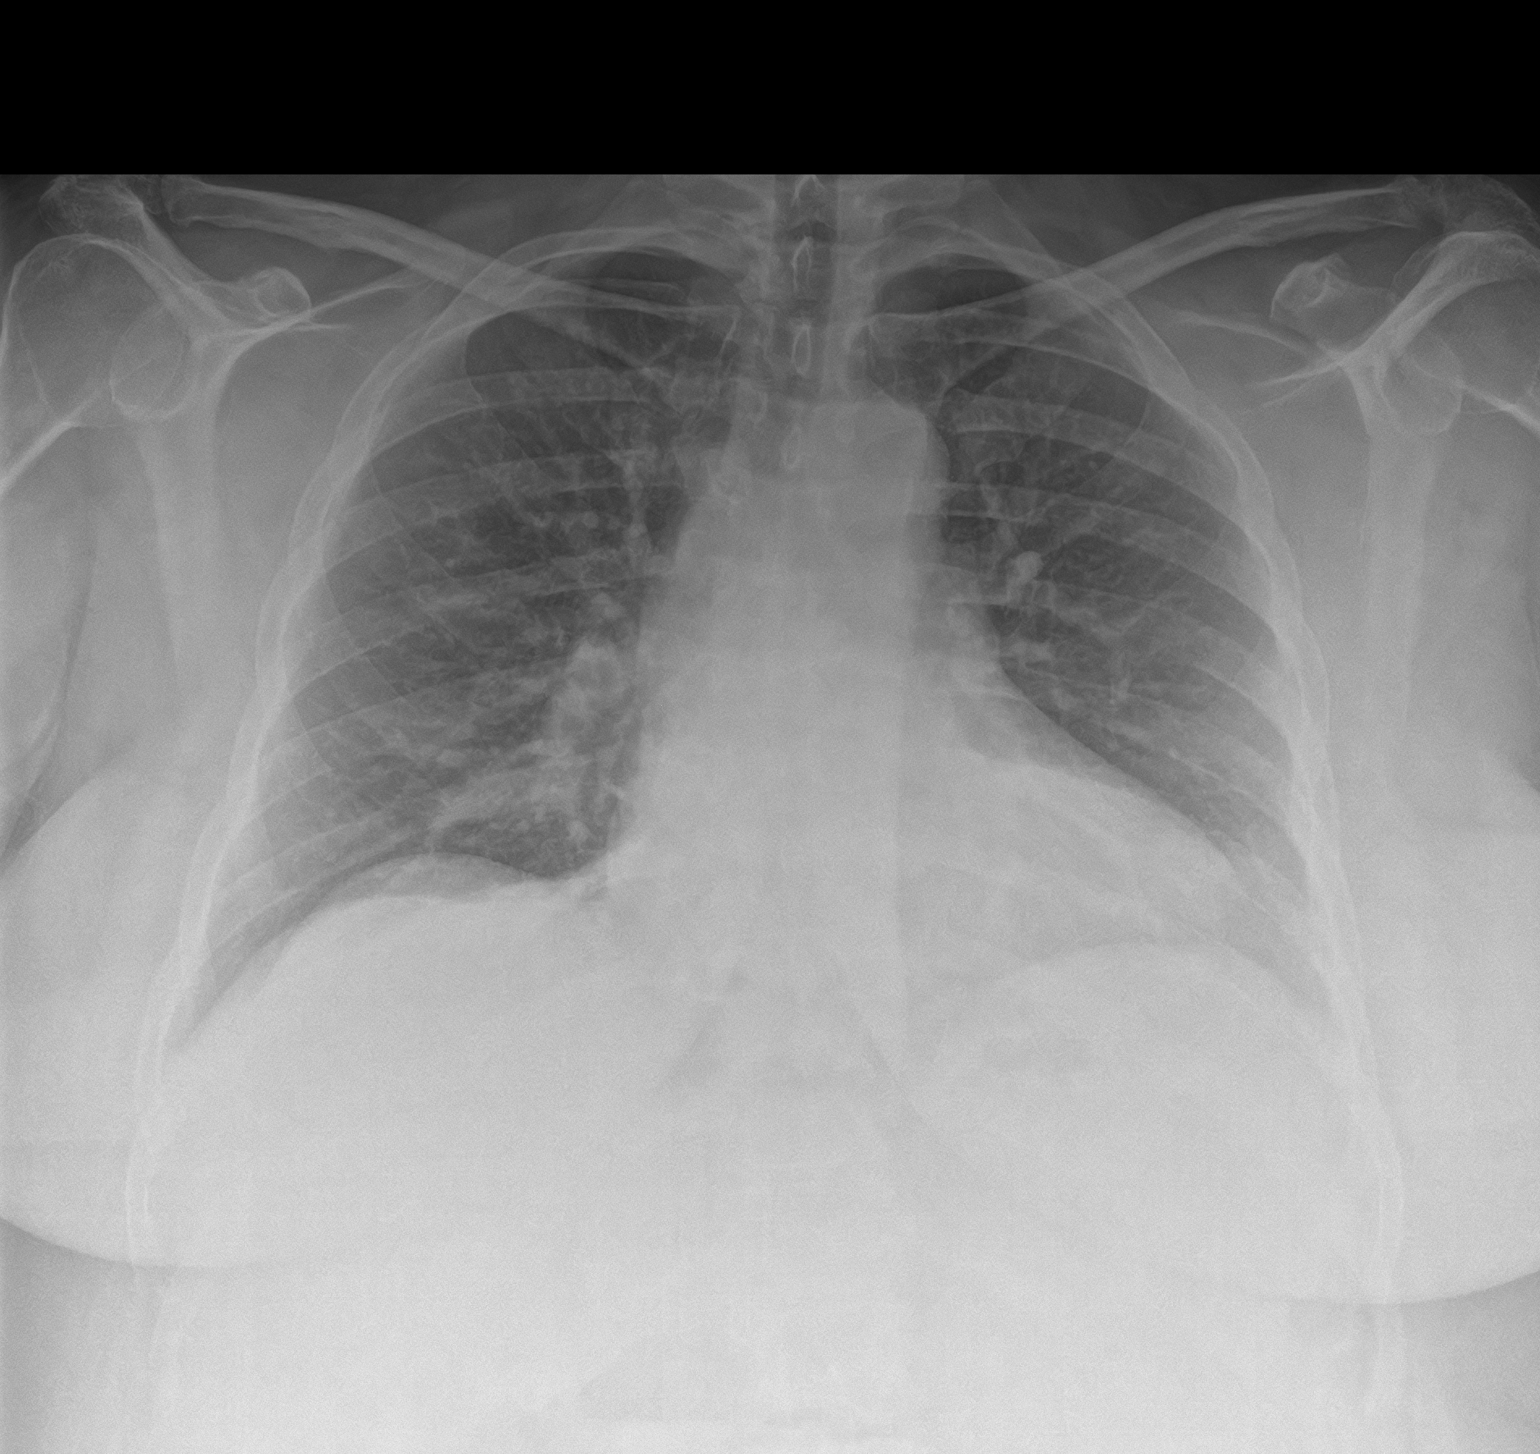
[im 2/2]
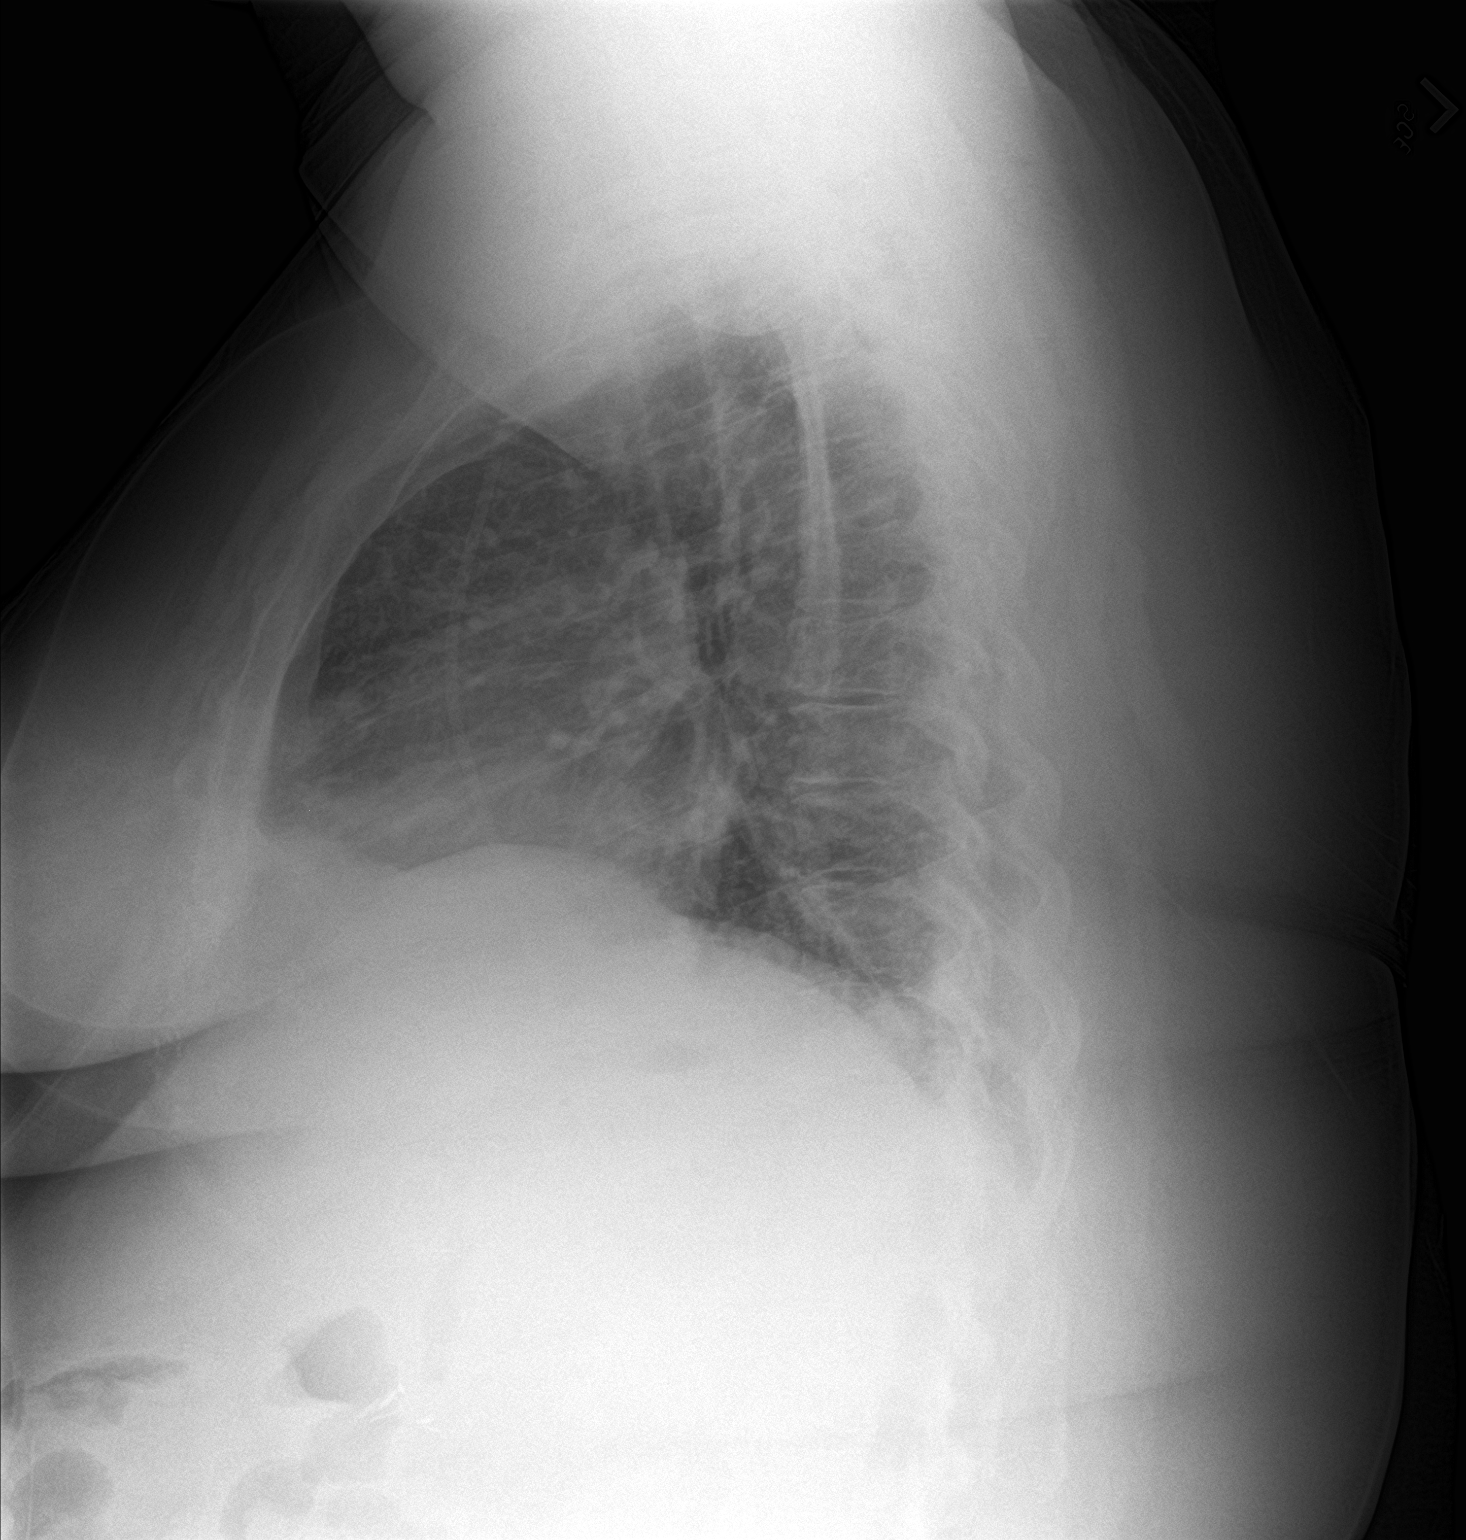

[2 of 2 positions shown; findings below may reference images not displayed]

FINDINGS: Mild cardiomegaly, stable from previous. Mediastinal silhouette
within normal limits.

Lungs mildly hypoinflated. Mild perihilar vascular and interstitial
congestion without pulmonary edema. No focal infiltrates. No pleural
effusion. No pneumothorax.

No acute osseous abnormality.
IMPRESSION: Stable cardiomegaly with mild perihilar and interstitial vascular
congestion without frank pulmonary edema.

## 2018-01-16 IMAGING — CT CT L SPINE W/O CM
3 series · 12 of 33 positions shown, 14 images · non-contrast
Comparison: Prior MRI from 02/03/2015.

CLINICAL DATA: Initial evaluation for chronic back pain, recent
spine injection.

EXAM:
CT LUMBAR SPINE WITHOUT CONTRAST
TECHNIQUE: Multidetector CT imaging of the lumbar spine was performed without
intravenous contrast administration. Multiplanar CT image
reconstructions were also generated.

[Series 4: l spine soft · axial · 0.33mm/px · z∈[-722,-542]mm · 4 of 128 slices shown, 5 images]
[im 20/128  soft-tissue]
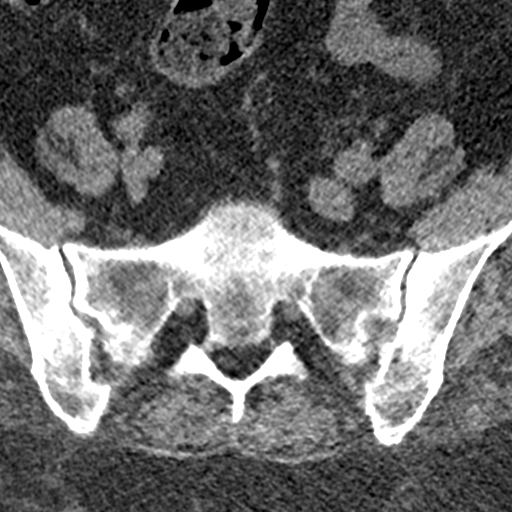
[im 20/128  bone]
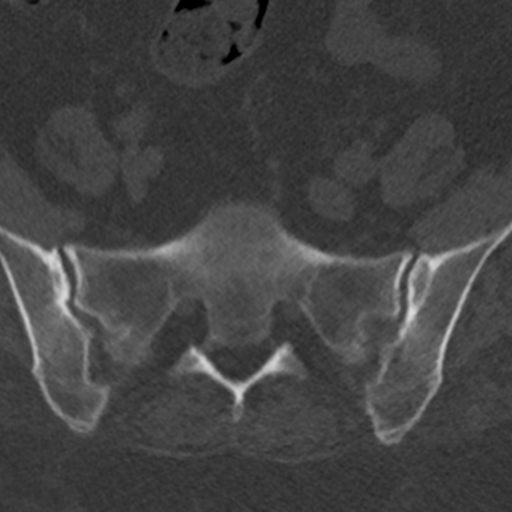
[im 49/128  bone]
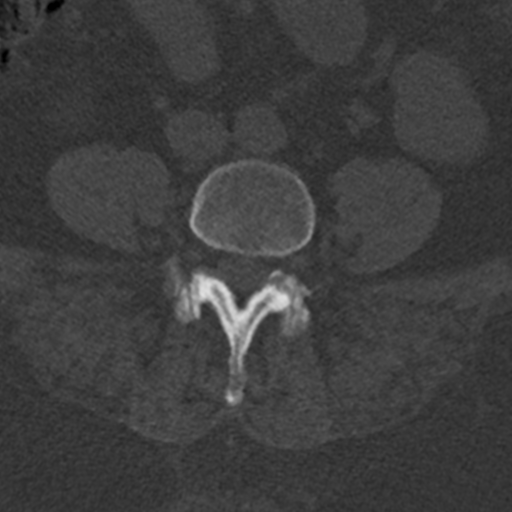
[im 79/128  bone]
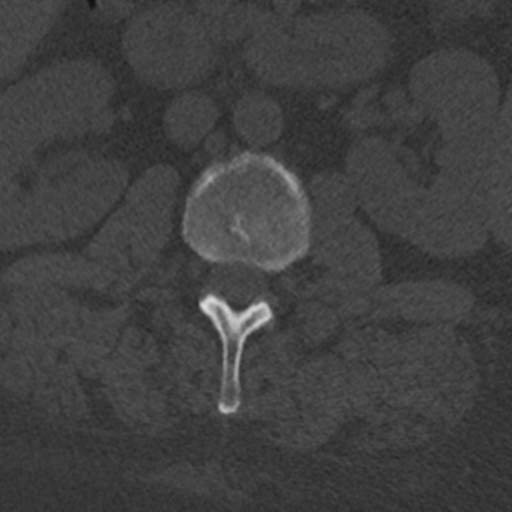
[im 108/128  bone]
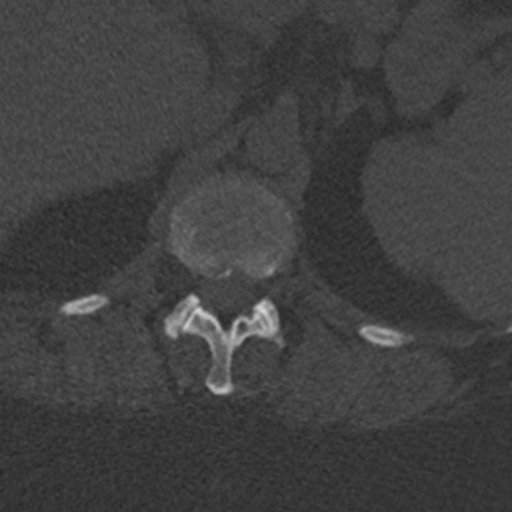

[Series 5: sagittal bone · sagittal · 0.29mm/px · 5 of 70 slices shown, 6 images]
[im 24/70  bone]
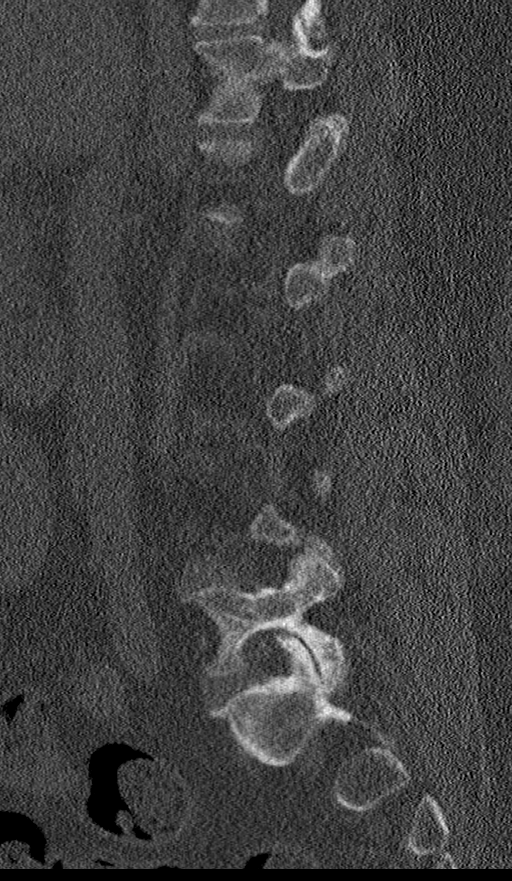
[im 29/70  bone]
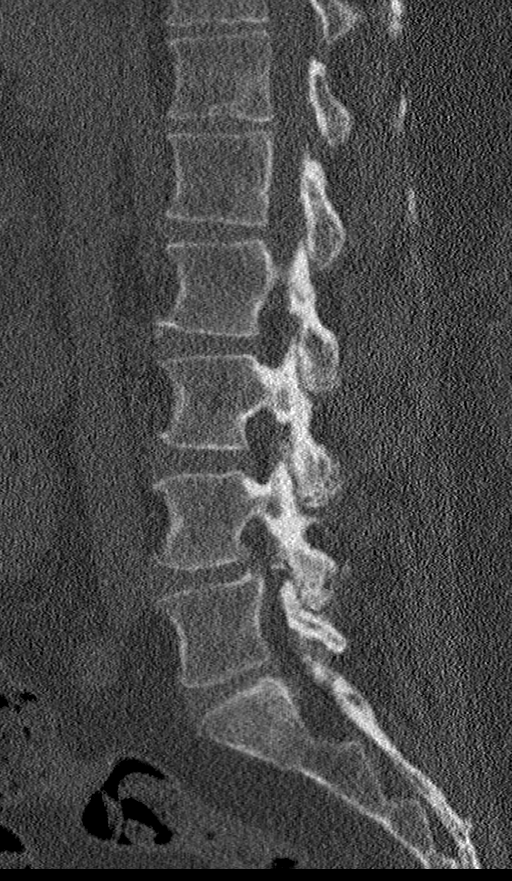
[im 35/70  soft-tissue]
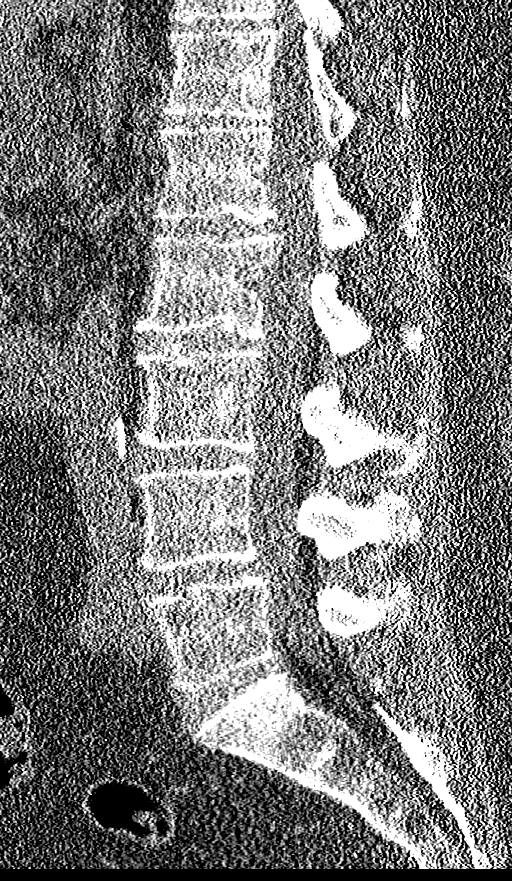
[im 35/70  bone]
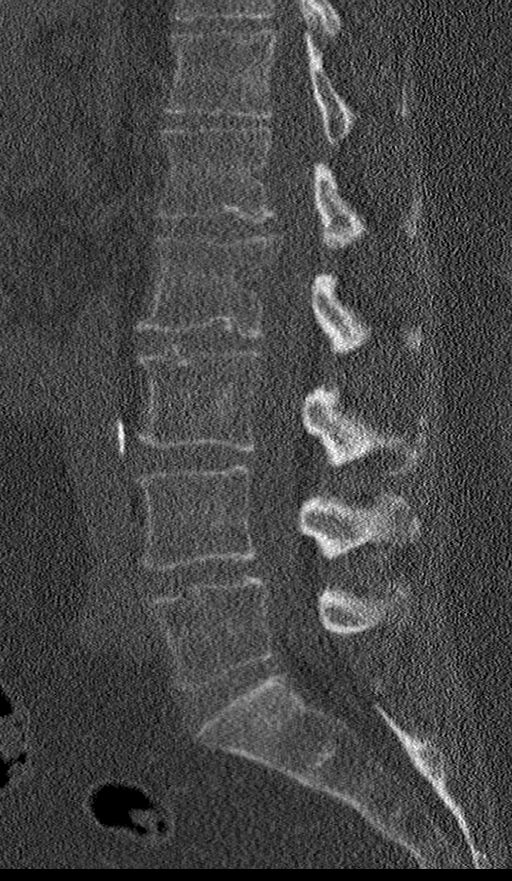
[im 41/70  bone]
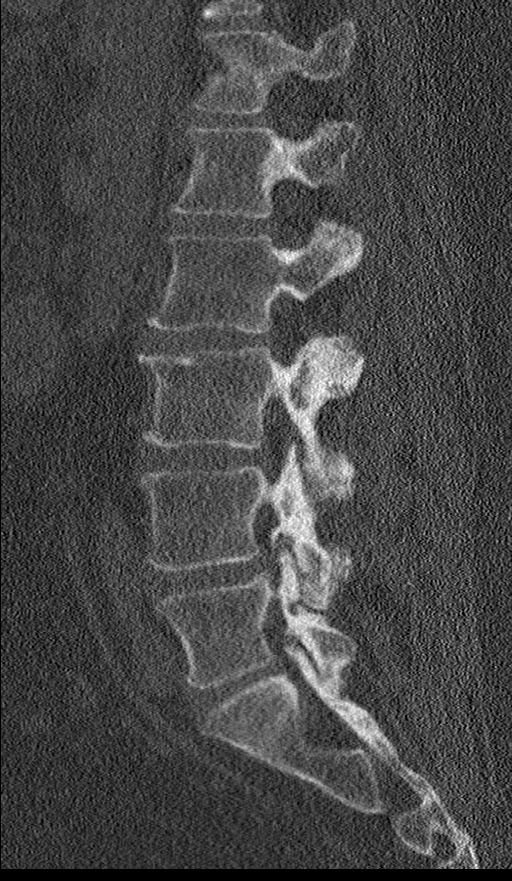
[im 47/70  bone]
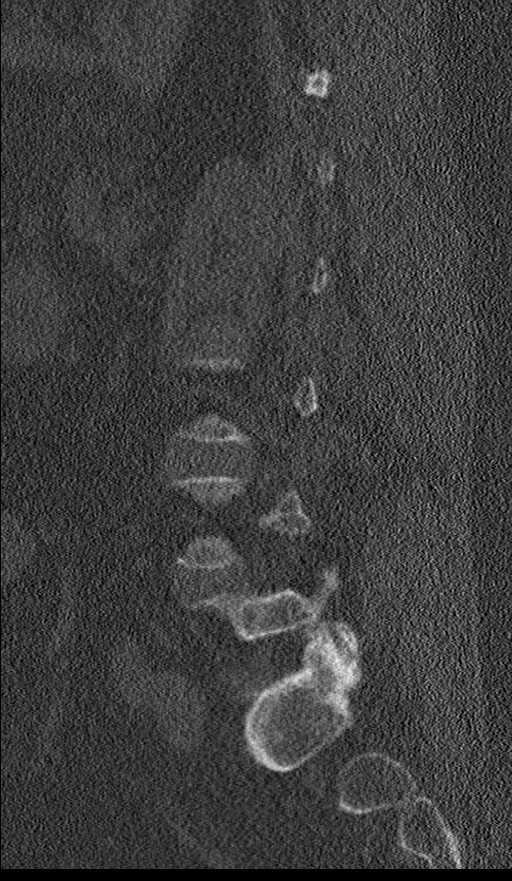

[Series 6: coronal bone · coronal · 0.44mm/px · 3 of 79 slices shown]
[im 16/79  bone]
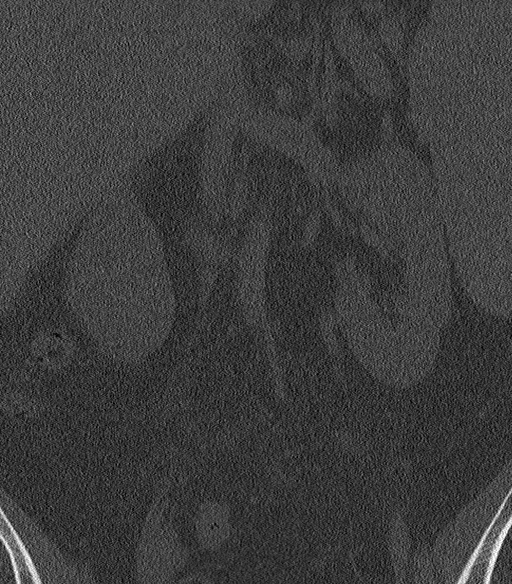
[im 32/79  bone]
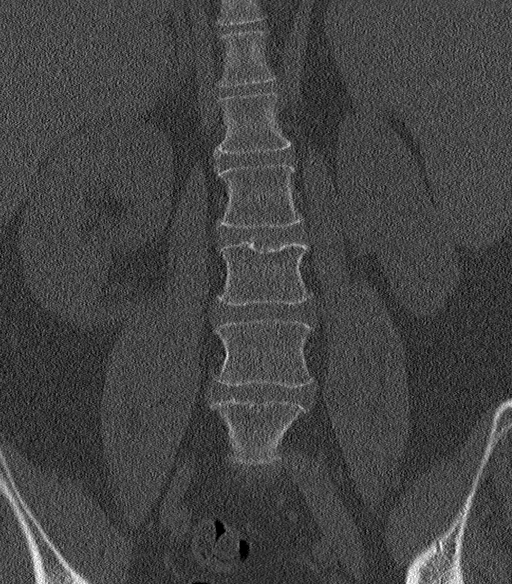
[im 47/79  bone]
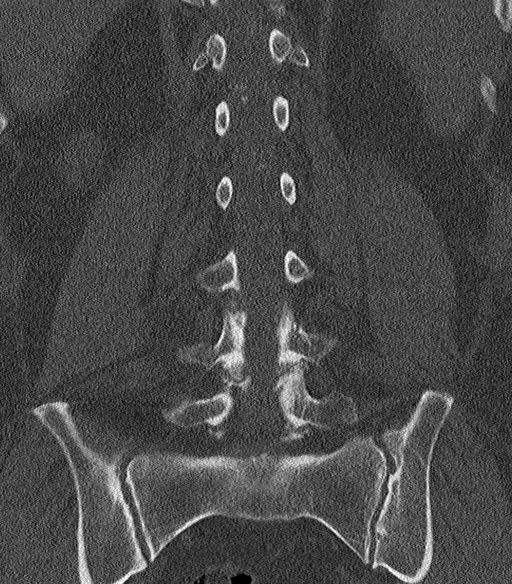

[12 of 33 positions shown; findings below may reference images not displayed]

FINDINGS: Segmentation: Normal segmentation. Lowest well-formed disc labeled
the L5-S1 level.

Alignment: Trace levoscoliosis with apex at L4-5. Mild straightening
of the normal lumbar lordosis. Trace anterolisthesis of L4 on L5. No
malalignment.

Vertebrae: Vertebral body heights maintained. No evidence for acute
or chronic fracture. Few scattered degenerative endplate Schmorl's
nodes noted, most notable about the L2-3 interspace. No discrete
osseous lesions. Visualized sacrum intact. SI joints approximated
and symmetric.

Paraspinal and other soft tissues: Paraspinous soft tissues
demonstrate no acute abnormality. Mild atherosclerotic plaque within
the infrarenal aorta. Visualized visceral structures otherwise
unremarkable.

Disc levels:

T11-12: Shallow posterior osseous ridging mildly indents the ventral
thecal sac. No canal or foraminal stenosis.

T12-L1: Right paracentral disc protrusion with associated spurring
mildly indents the ventral thecal sac. No significant stenosis. This
is stable from previous.

L1-2: Small left paracentral disc protrusion indents the ventral
thecal sac. Mild facet arthrosis. No significant canal or foraminal
stenosis. This is stable from previous.

L2-3: Minimal disc bulge with mild bilateral facet arthropathy. No
stenosis.

L3-4: Minimal annular disc bulge. Moderate bilateral facet
arthrosis. No significant stenosis.

L4-5: Trace anterolisthesis. Minimal annular disc bulge. Moderate
bilateral facet arthrosis. Resultant mild to moderate spinal
stenosis, unchanged. No significant foraminal encroachment.

L5-S1:  Mild disc bulge and facet hypertrophy.  No stenosis.

Overall appearance of the degenerative changes are not significantly
changed relative to previous MRI. No complication identified status
post recent injection.
IMPRESSION: 1. No acute abnormality within the lumbar spine. No complication
identified status post recent injection.
2. Multilevel disc degeneration with facet arthropathy as above,
most notable at L4-5. Overall, appearance is not significantly
changed relative to most recent MRI from 02/03/2015.

## 2018-02-06 IMAGING — CT CT HEAD W/O CM
3 series · 15 of 47 positions shown, 18 images · non-contrast
Comparison: 06/24/2015.

CLINICAL DATA: Dizziness and headaches since last night. Altered
mental status. Speech difficulty.

EXAM:
CT HEAD WITHOUT CONTRAST
TECHNIQUE: Contiguous axial images were obtained from the base of the skull
through the vertex without intravenous contrast.

[Series 2: head wo · axial · 0.43mm/px · z∈[+568,+693]mm · 9 of 31 slices shown, 12 images]
[im 3/31  brain]
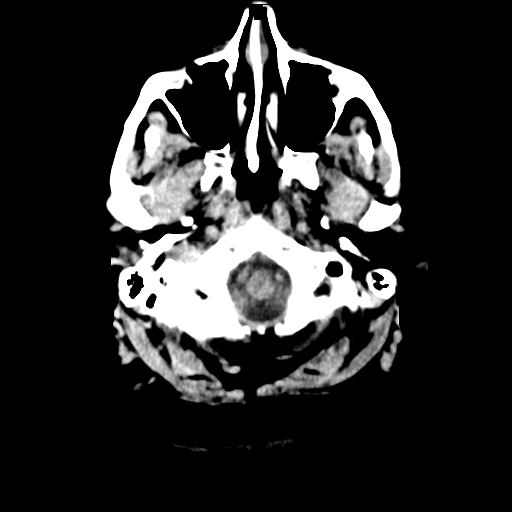
[im 3/31  bone]
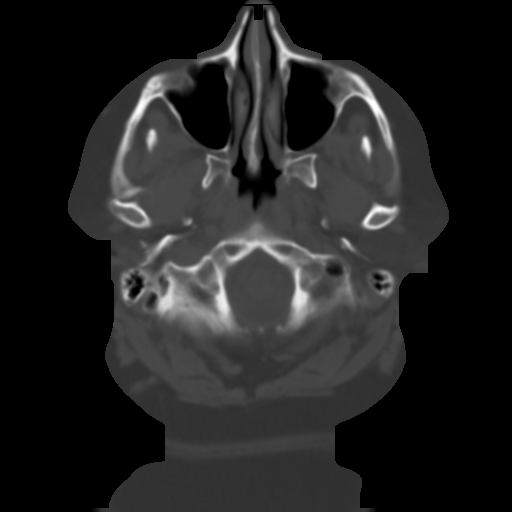
[im 6/31  brain]
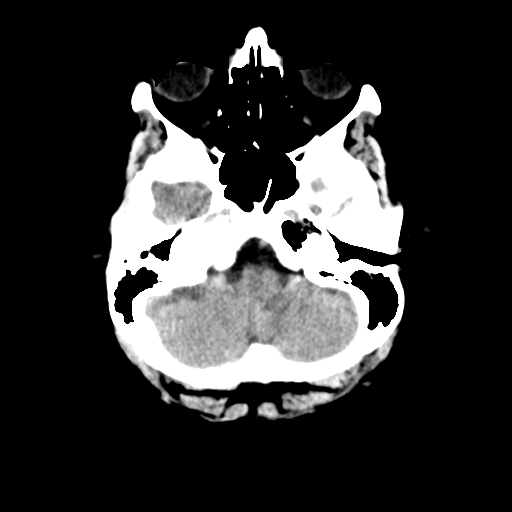
[im 9/31  brain]
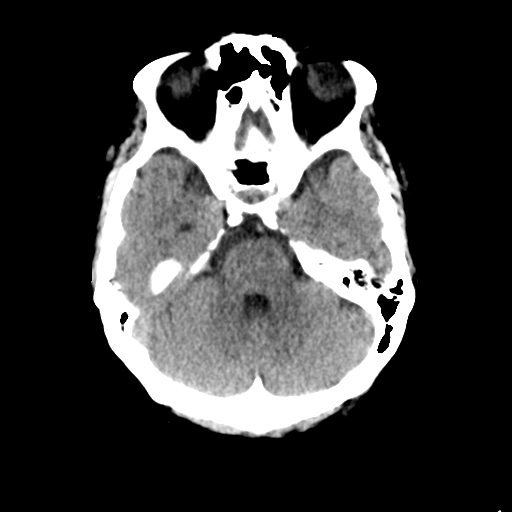
[im 12/31  brain]
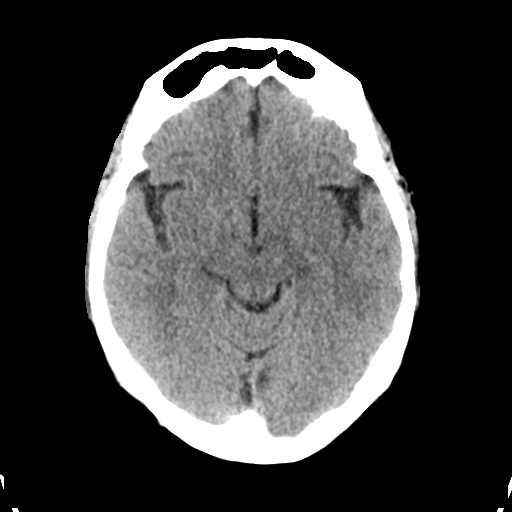
[im 16/31  brain]
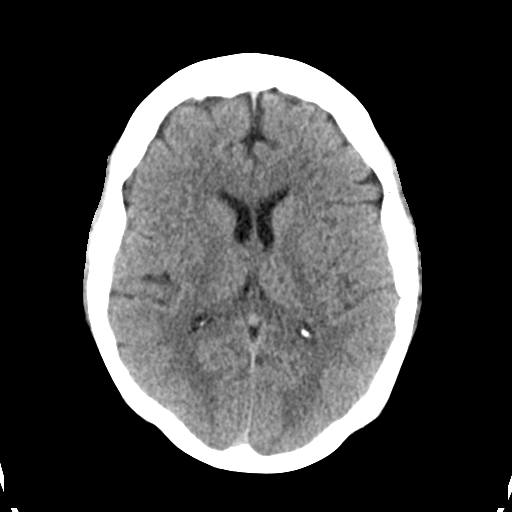
[im 16/31  bone]
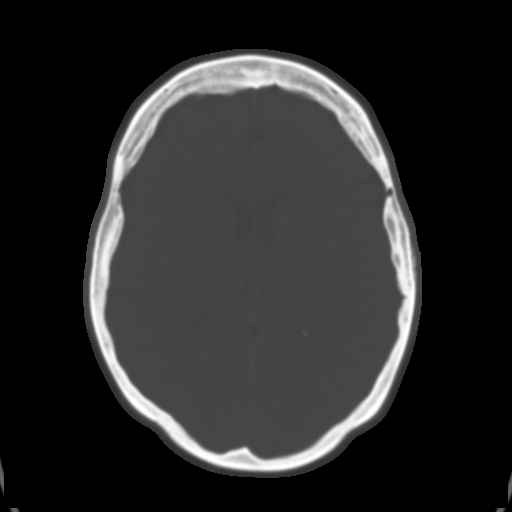
[im 19/31  brain]
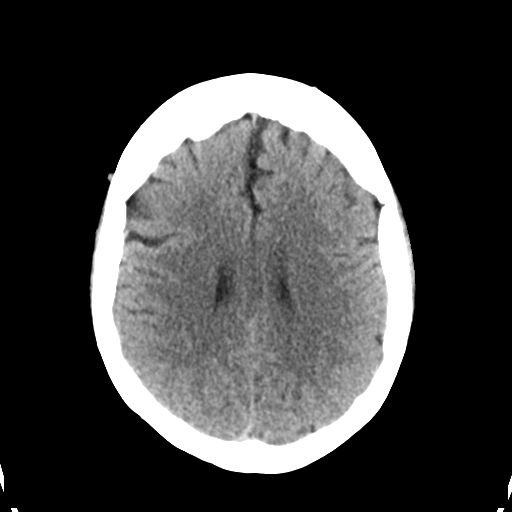
[im 22/31  brain]
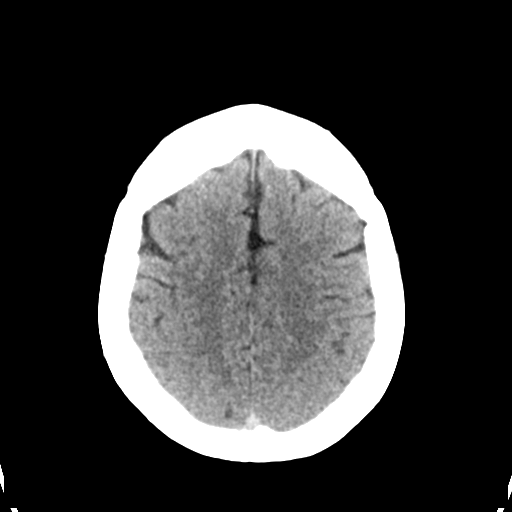
[im 25/31  brain]
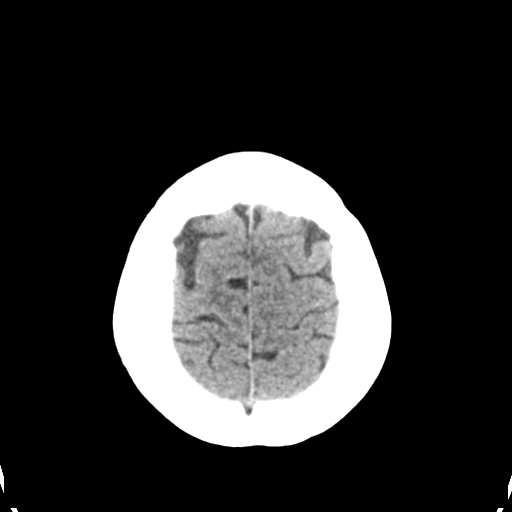
[im 28/31  brain]
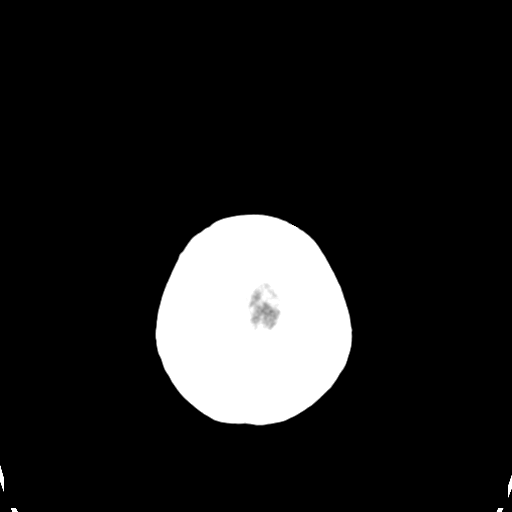
[im 28/31  bone]
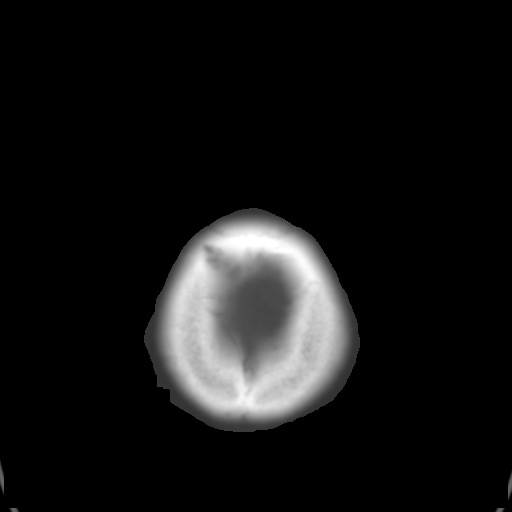

[Series 4: coronal soft tissue · coronal · 0.30mm/px · 3 of 62 slices shown]
[im 21/62  brain]
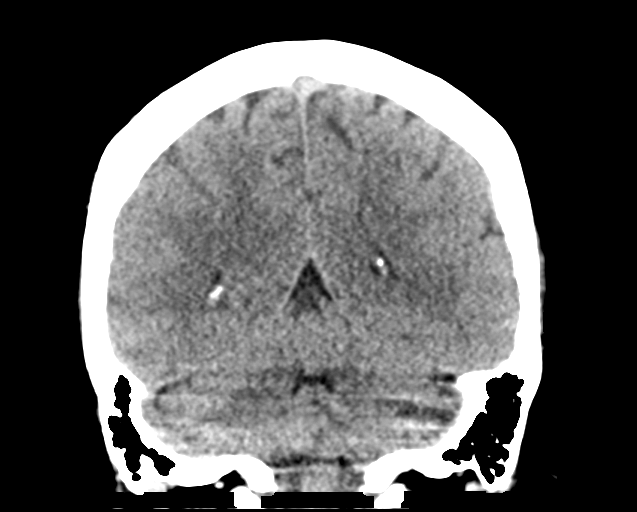
[im 28/62  brain]
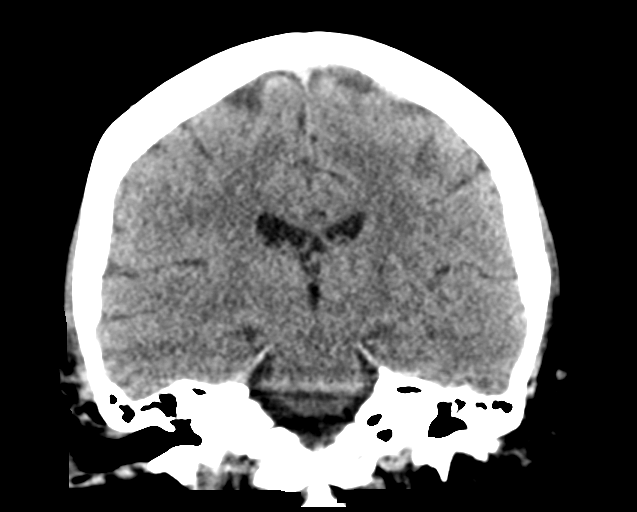
[im 34/62  brain]
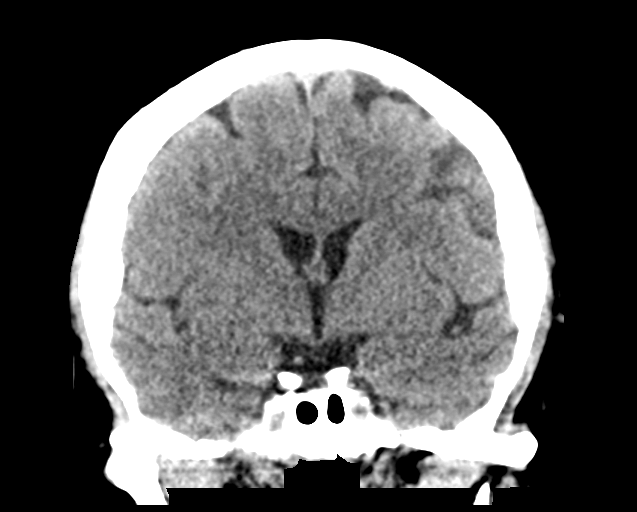

[Series 5: sagittal soft tissue · sagittal · 0.31mm/px · 3 of 52 slices shown]
[im 18/52  brain]
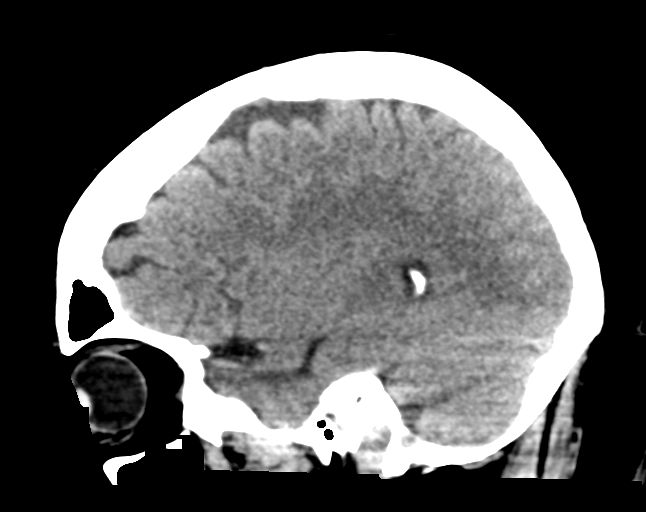
[im 26/52  brain]
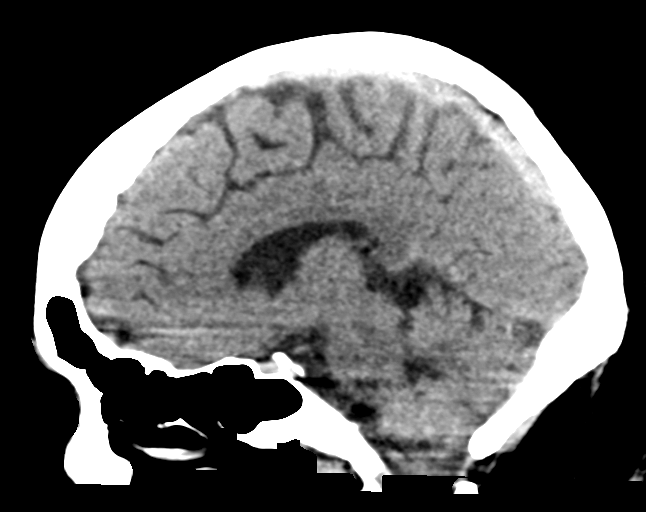
[im 35/52  brain]
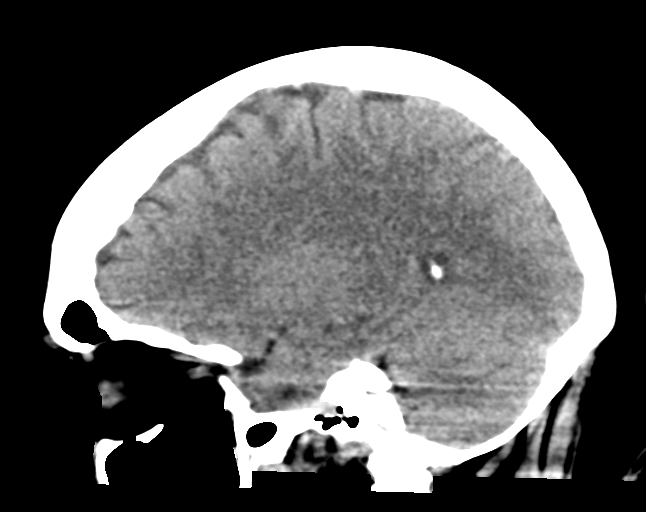

[15 of 47 positions shown; findings below may reference images not displayed]

FINDINGS: Brain: No evidence of acute infarction, hemorrhage, hydrocephalus,
extra-axial collection or mass lesion/mass effect.

Vascular: No hyperdense vessel or unexpected calcification.

Skull: Normal. Negative for fracture or focal lesion.

Sinuses/Orbits: Unremarkable.

Other: Mild bilateral temporomandibular joint degenerative changes.
IMPRESSION: No acute abnormality.

## 2018-02-11 IMAGING — CT CT ABD-PELV W/ CM
2 of 5 series · 15 of 46 positions shown, 17 images · IV contrast (APPLIED)
Comparison: CT the abdomen and pelvis 09/23/2016.

CLINICAL DATA: 51-year-old female with history of abdominal pain
common nausea and vomiting.

EXAM:
CT ABDOMEN AND PELVIS WITH CONTRAST
TECHNIQUE: Multidetector CT imaging of the abdomen and pelvis was performed
using the standard protocol following bolus administration of
intravenous contrast.
CONTRAST:  100mL [id number] IOPAMIDOL ([id number]) INJECTION 61%

[Series 2: axial st · axial · 0.96mm/px · z∈[-961,-511]mm · 12 of 102 slices shown, 14 images]
[im 6/102  soft-tissue]
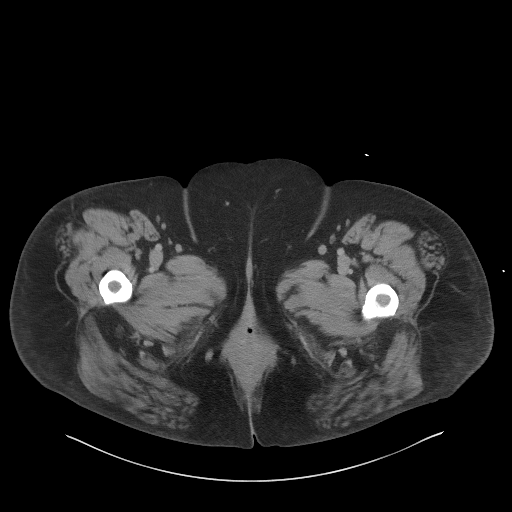
[im 6/102  bone]
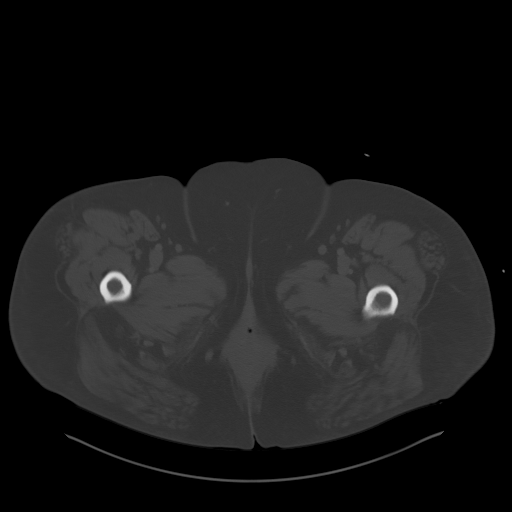
[im 16/102  soft-tissue]
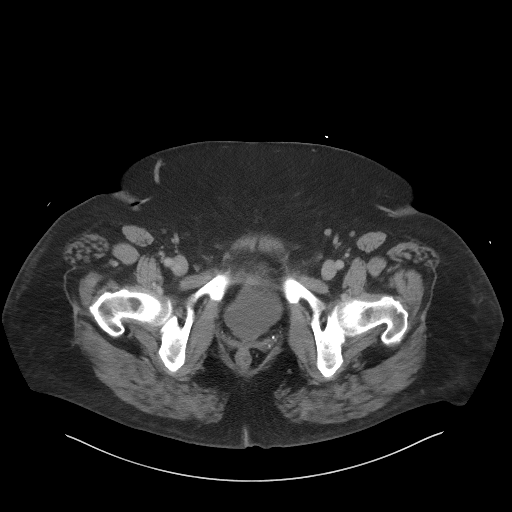
[im 22/102  soft-tissue]
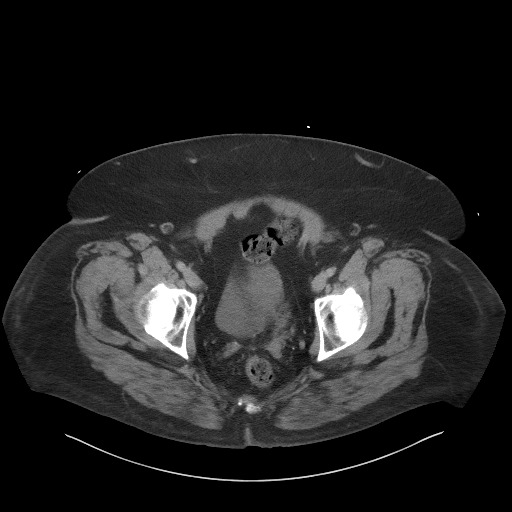
[im 32/102  soft-tissue]
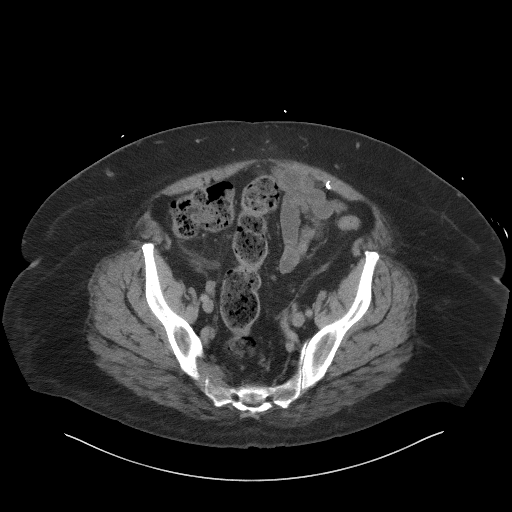
[im 38/102  soft-tissue]
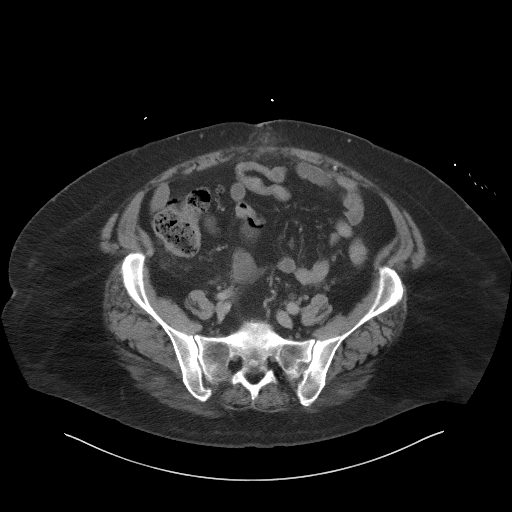
[im 48/102  soft-tissue]
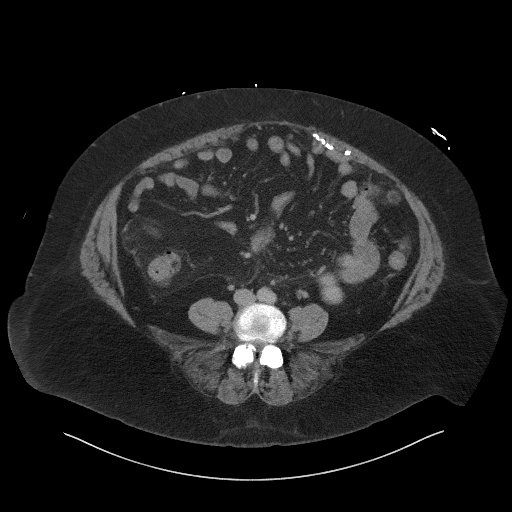
[im 54/102  soft-tissue]
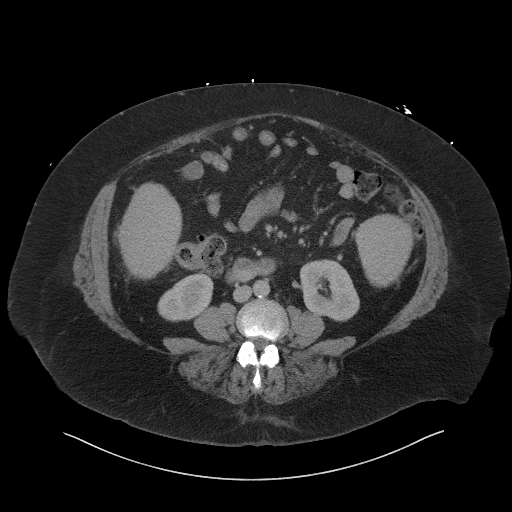
[im 64/102  soft-tissue]
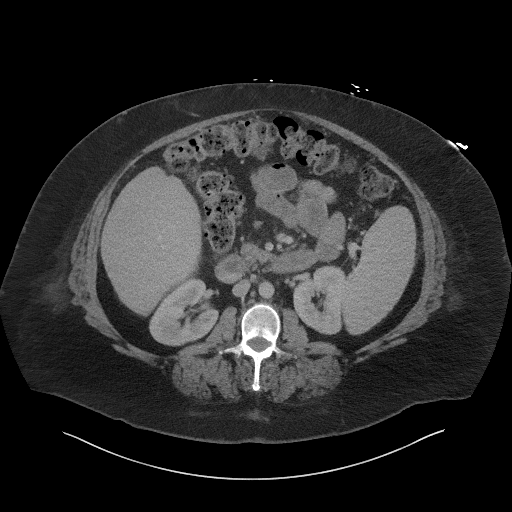
[im 70/102  soft-tissue]
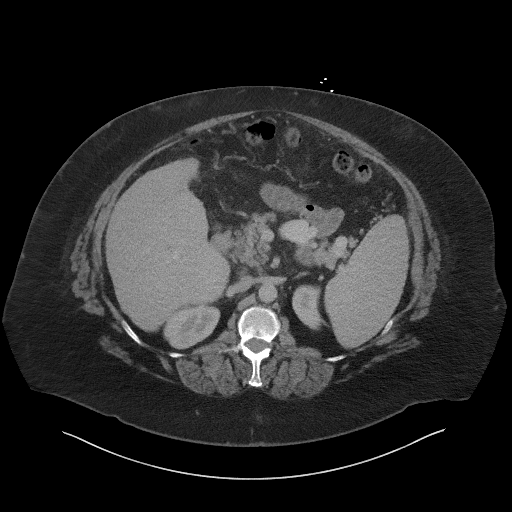
[im 70/102  bone]
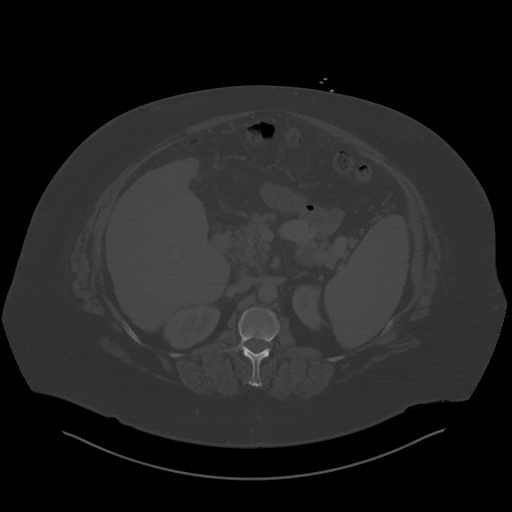
[im 80/102  soft-tissue]
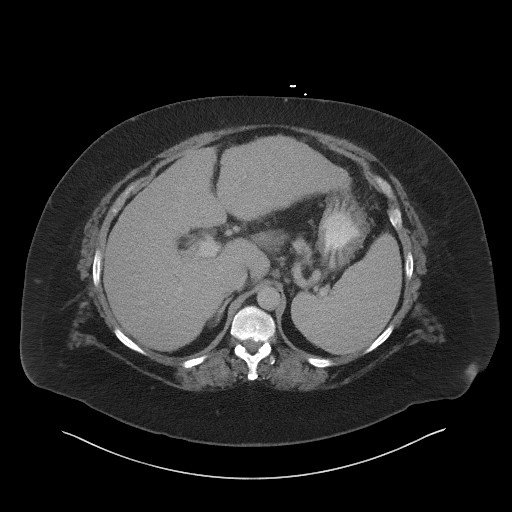
[im 86/102  soft-tissue]
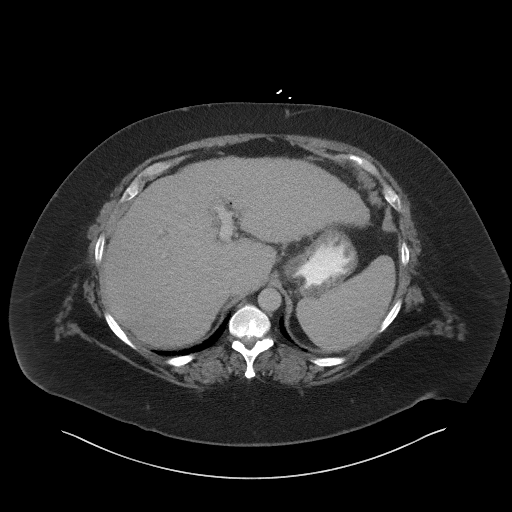
[im 96/102  soft-tissue]
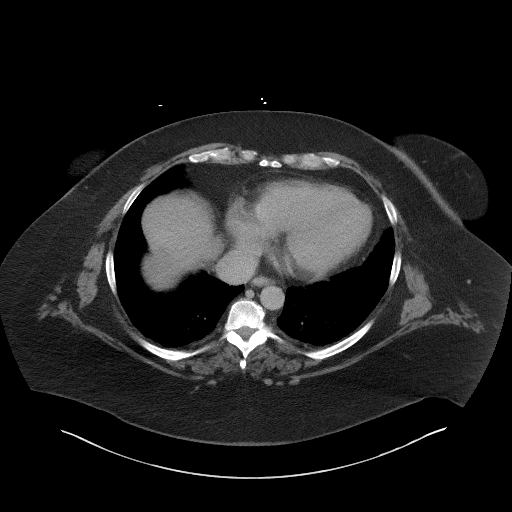

[Series 5: coronal st · coronal · 0.78mm/px · 3 of 110 slices shown]
[im 37/110  soft-tissue]
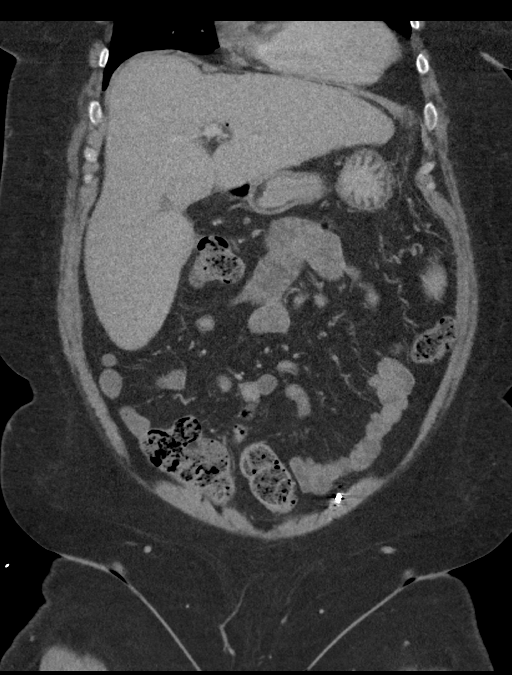
[im 49/110  soft-tissue]
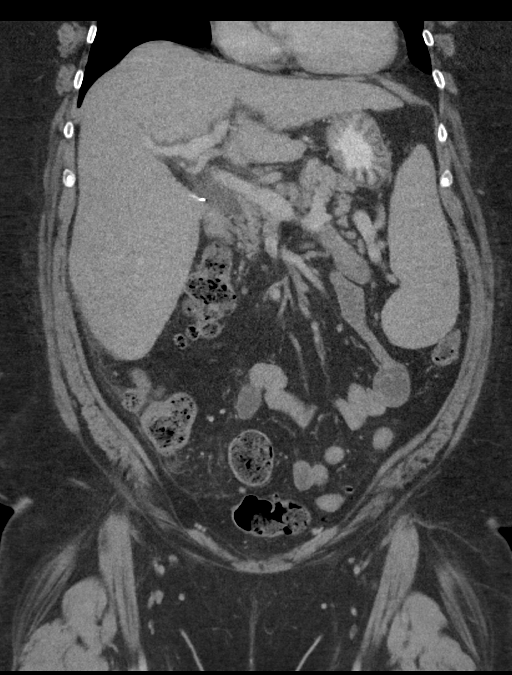
[im 61/110  soft-tissue]
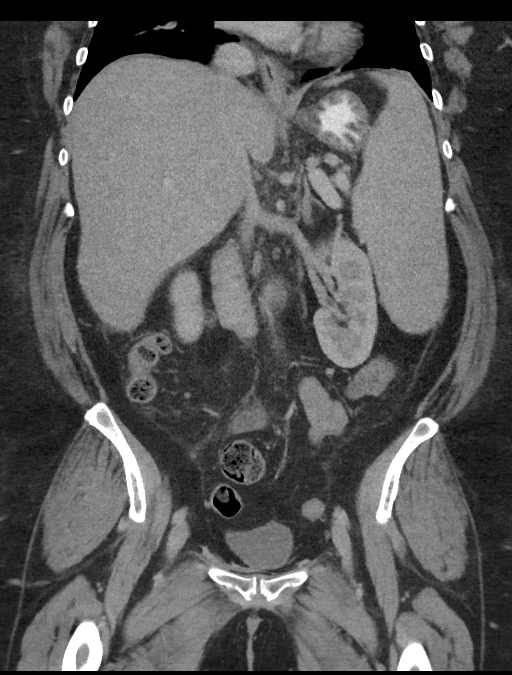

[15 of 46 positions shown; findings below may reference images not displayed]

FINDINGS: Lower chest: Unremarkable.

Hepatobiliary: Liver has a shrunken appearance and nodular contour,
compatible with underlying cirrhosis. Several small well-defined
low-attenuation lesions are scattered throughout the hepatic
parenchyma, stable in size, number and appearance compared to prior
examinations. The smallest of these lesions are too small to
definitively characterize, but are statistically likely tiny cysts.
The largest lesion measures 1.9 cm segment 3 of the liver adjacent
to the falciform ligament, compatible with a simple cyst. No
suspicious hepatic lesions are noted. No intra or extrahepatic
biliary ductal dilatation. Status post cholecystectomy.

Pancreas: No definite pancreatic mass or peripancreatic inflammatory
changes.

Spleen: Spleen is enlarged measuring 6.9 x 13.6 x 20.8 cm (estimated
splenic volume of 976 mL).

Adrenals/Urinary Tract: Bilateral kidneys and bilateral adrenal
glands are normal in appearance. No hydroureteronephrosis. Urinary
bladder is normal in appearance.

Stomach/Bowel: The appearance of the stomach is normal. There is no
pathologic dilatation of small bowel or colon. A few scattered
colonic diverticulae are noted, without surrounding inflammatory
changes to suggest an acute diverticulitis at this time. Normal
appendix.

Vascular/Lymphatic: Aortic atherosclerosis, without evidence of
aneurysm or dissection in the abdominal or pelvic vasculature.
Portal vein is mildly dilated measuring 16 mm in the porta hepatis.
No lymphadenopathy noted in the abdomen or pelvis.

Reproductive: Status posthysterectomy.  Ovaries are atrophic.

Other: Tiny umbilical hernia containing a small amount of omental
fat. No significant volume of ascites. No pneumoperitoneum.

Musculoskeletal: Suture line in the left rectus abdominus
musculature. There are no aggressive appearing lytic or blastic
lesions noted in the visualized portions of the skeleton.
IMPRESSION: 1. No acute findings are noted in the abdomen or pelvis to account
for the patient's symptoms.
2. Stigmata of cirrhosis with portal hypertension, including
splenomegaly, similar to prior examinations.
3. Mild colonic diverticulosis without evidence of acute
diverticulitis at this time.
4. Normal appendix.
5. Aortic atherosclerosis.
6. Additional incidental findings, as above.
Aortic Atherosclerosis ([id number]).

## 2018-02-11 IMAGING — CR DG CHEST 2V
2 series · 2 of 2 positions shown · non-contrast
Comparison: 09/02/2016, 07/12/2016 and earlier, including CT
abdomen and pelvis 09/23/2016

CLINICAL DATA: 51-year-old with current history of hepatic
cirrhosis and COPD, presenting with acute superimposed upon chronic
generalized abdominal pain. Recent hospitalization for pneumonia and
hyperammonemia.

EXAM:
CHEST  2 VIEW

[chest pa]
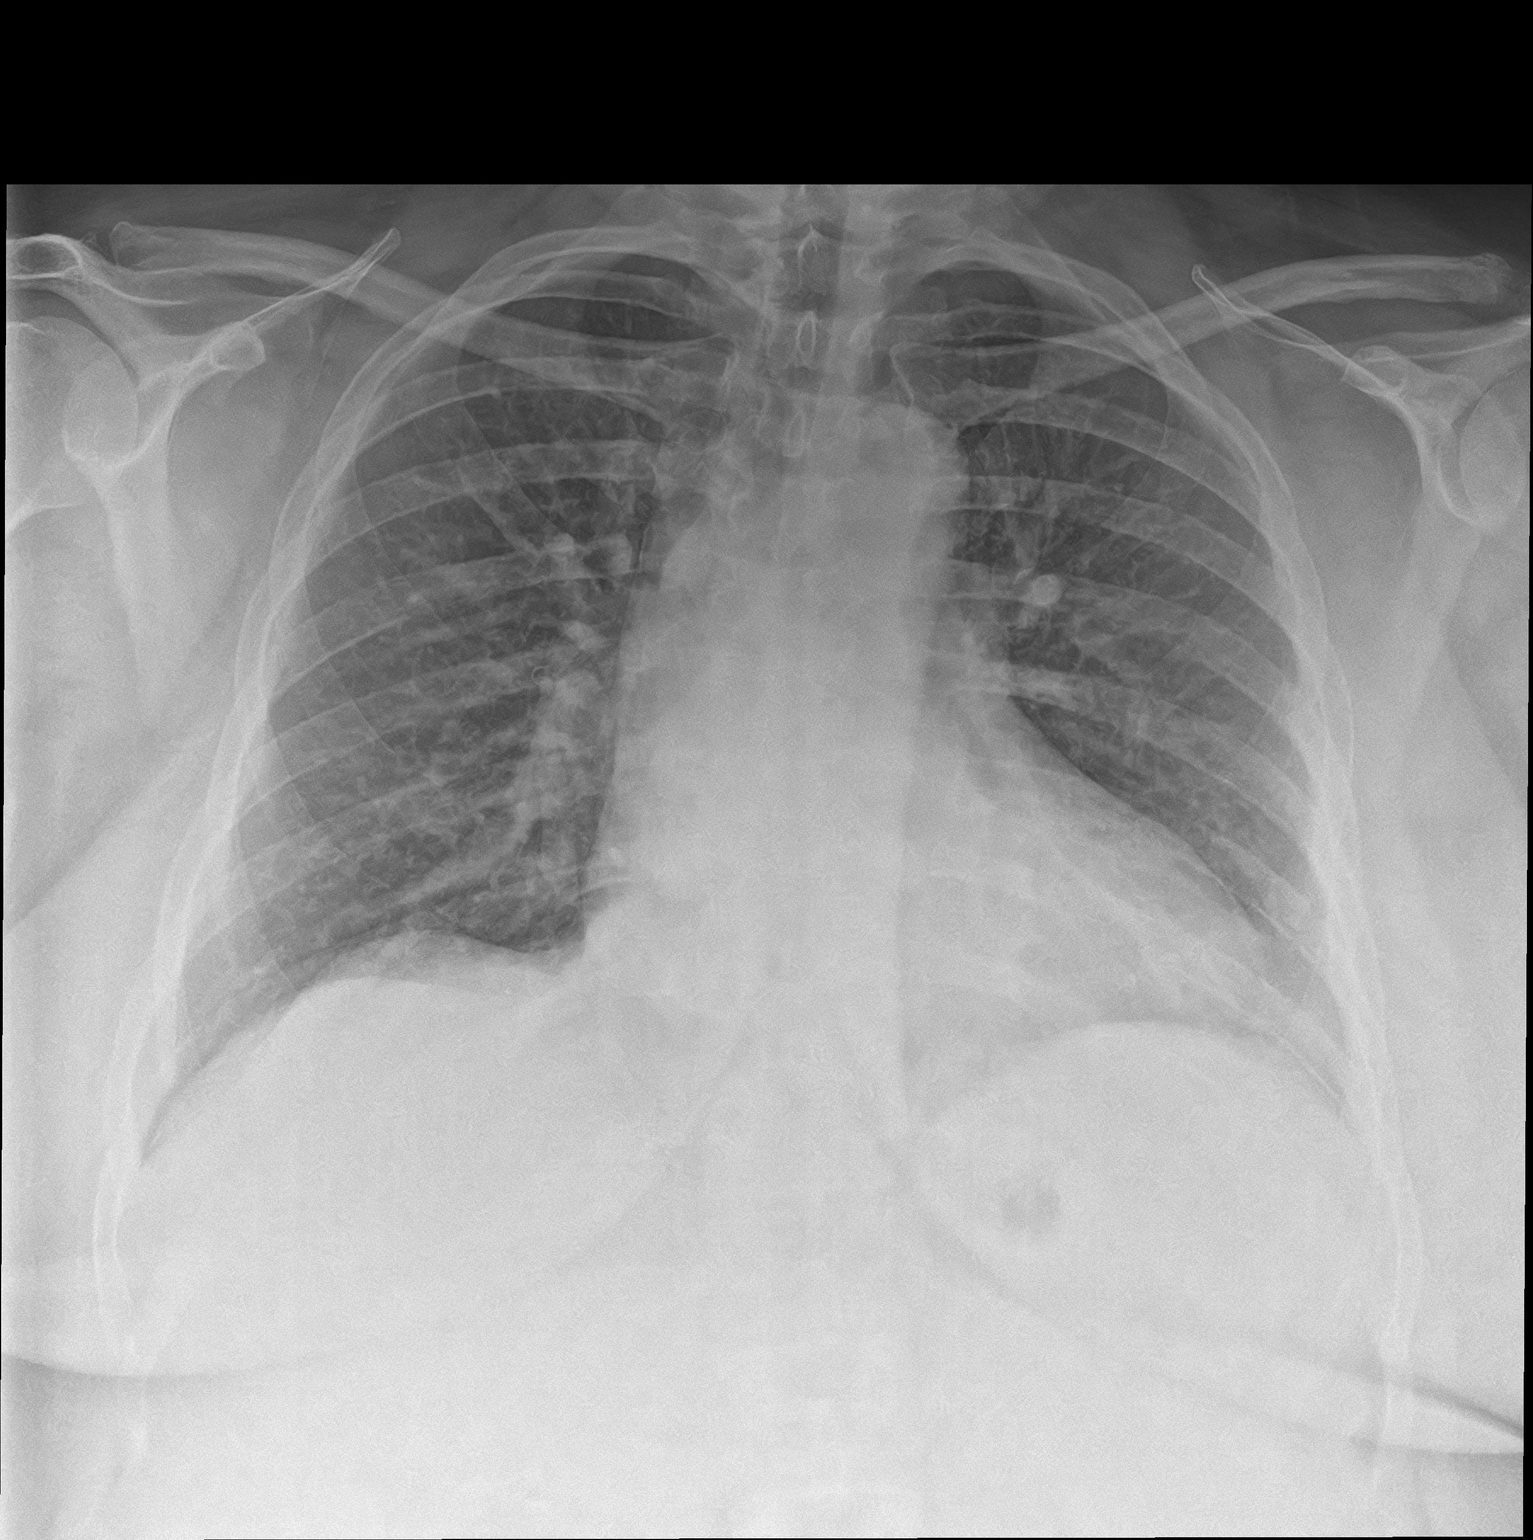

[chest lat]
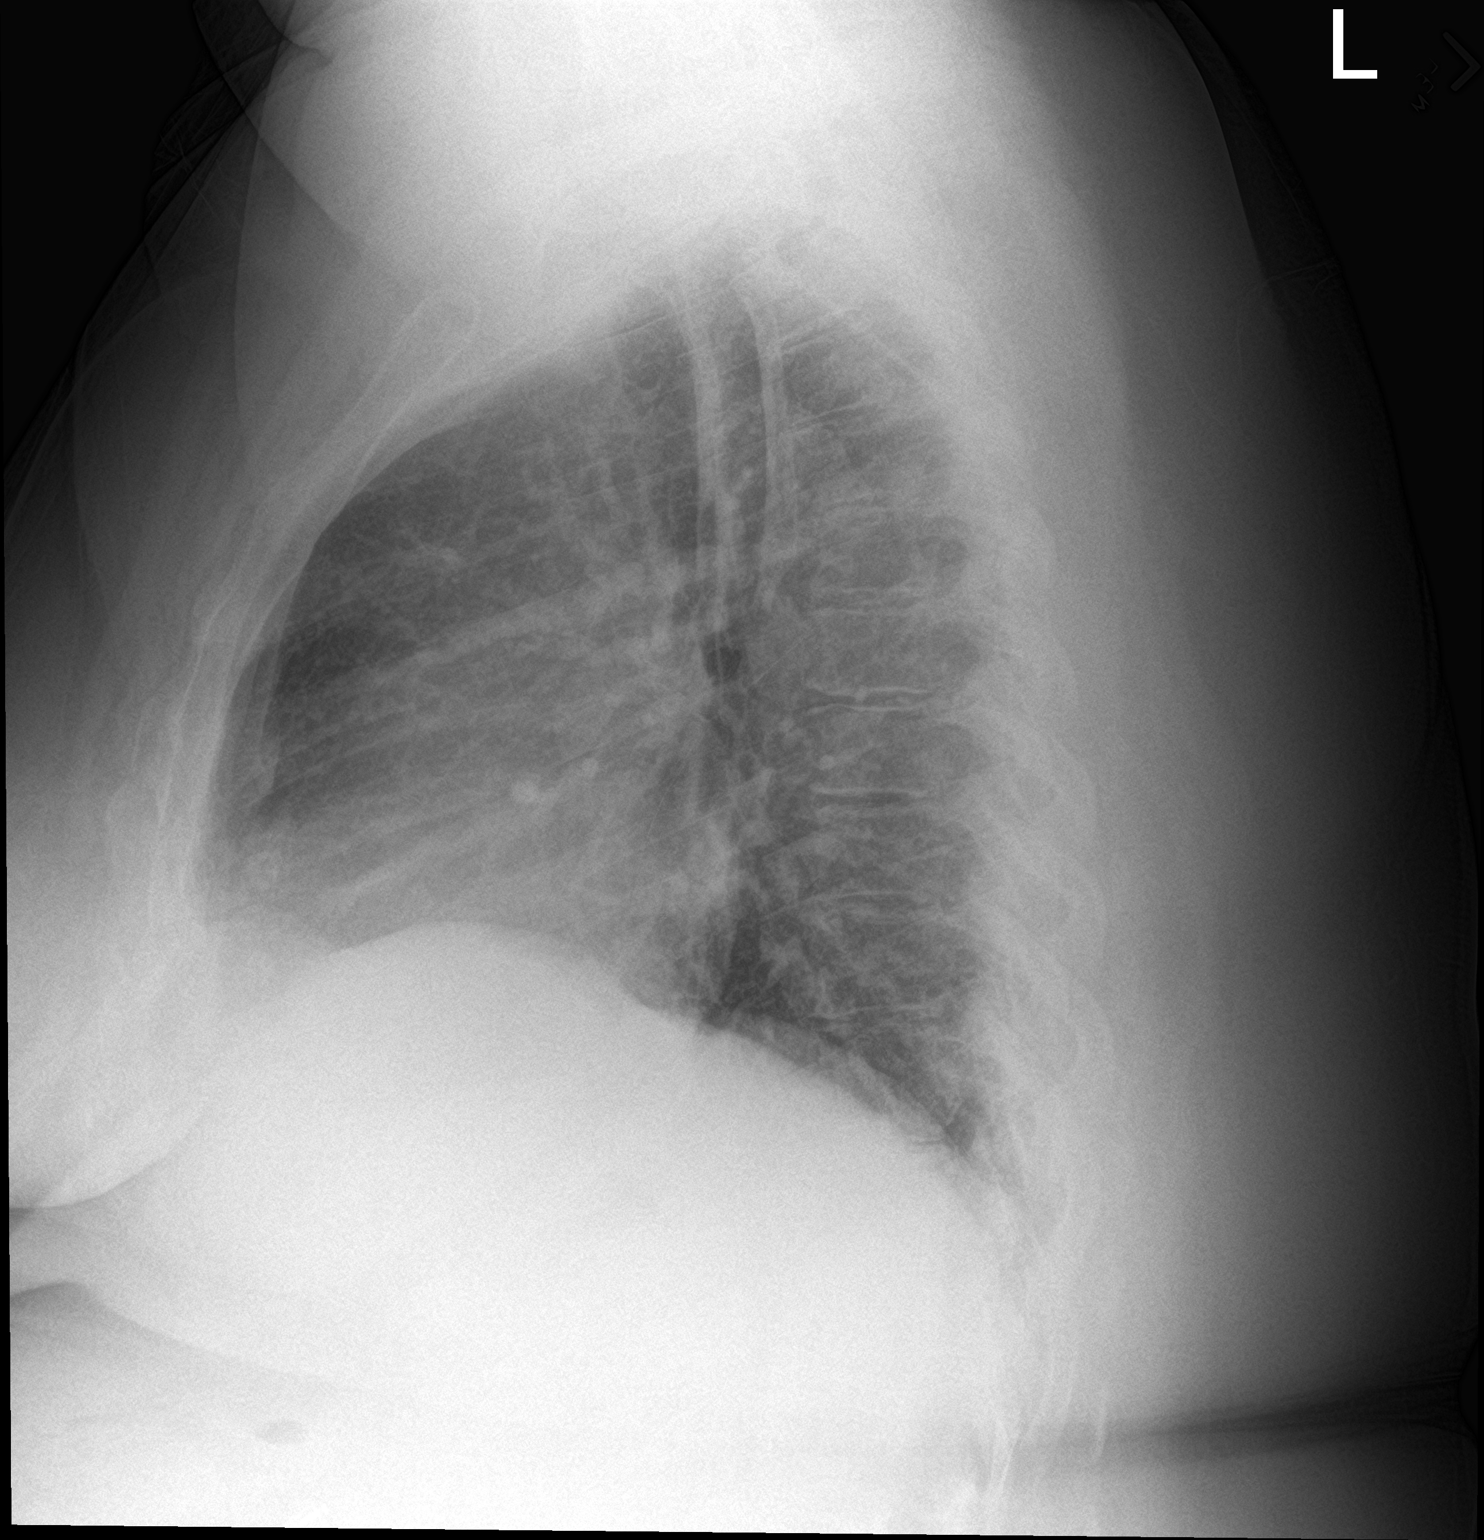

[2 of 2 positions shown; findings below may reference images not displayed]

FINDINGS: Cardiac silhouette mildly enlarged, unchanged. Hilar and mediastinal
contours otherwise unremarkable. Mild pulmonary venous hypertension
without overt edema, unchanged. Lungs otherwise clear. No localized
airspace consolidation. No pleural effusions. No pneumothorax. Mild
degenerative changes involving the thoracic spine.
IMPRESSION: No acute cardiopulmonary disease. Stable mild cardiomegaly. Stable
mild pulmonary venous hypertension without overt edema.

## 2018-02-15 IMAGING — CT CT ABD-PELV W/ CM
2 of 5 series · 16 of 46 positions shown, 18 images · IV contrast (APPLIED)
Comparison: CT scan of September 28, 2016.

CLINICAL DATA: Right lower quadrant abdominal pain.

EXAM:
CT ABDOMEN AND PELVIS WITH CONTRAST
TECHNIQUE: Multidetector CT imaging of the abdomen and pelvis was performed
using the standard protocol following bolus administration of
intravenous contrast.
CONTRAST:  125mL [id number] IOPAMIDOL ([id number]) INJECTION 61%

[Series 2: routine abd/pel with · axial · 0.98mm/px · z∈[-548,-98]mm · 13 of 102 slices shown, 15 images]
[im 6/102  soft-tissue]
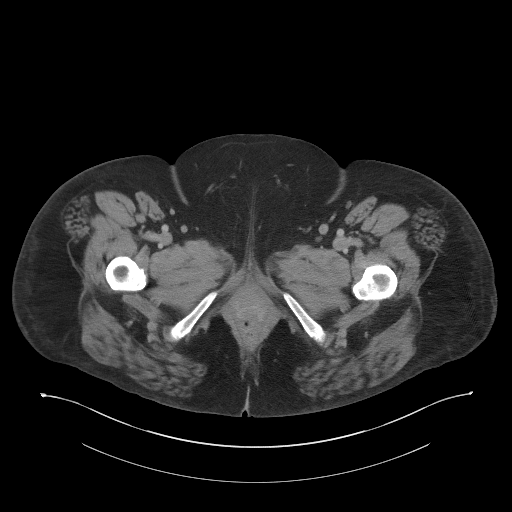
[im 6/102  bone]
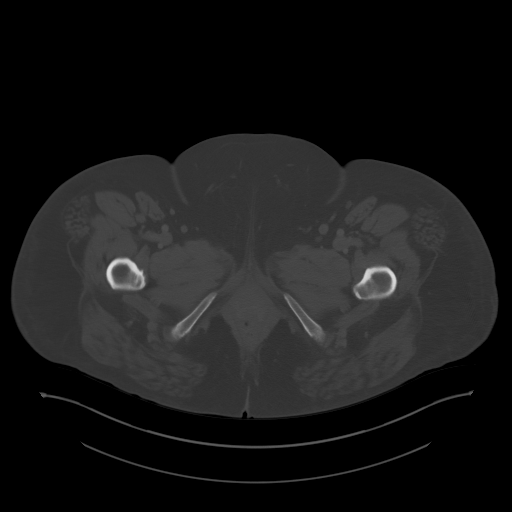
[im 16/102  soft-tissue]
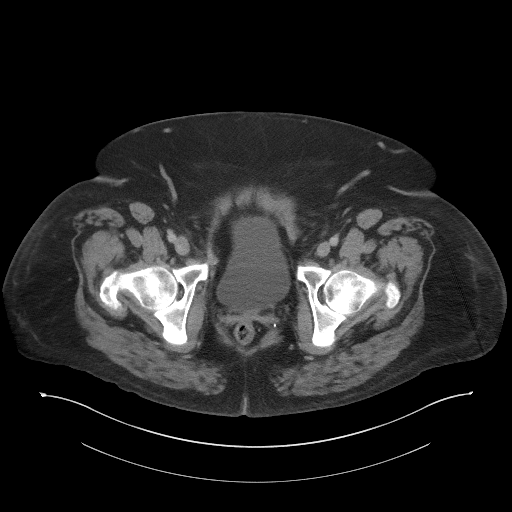
[im 22/102  soft-tissue]
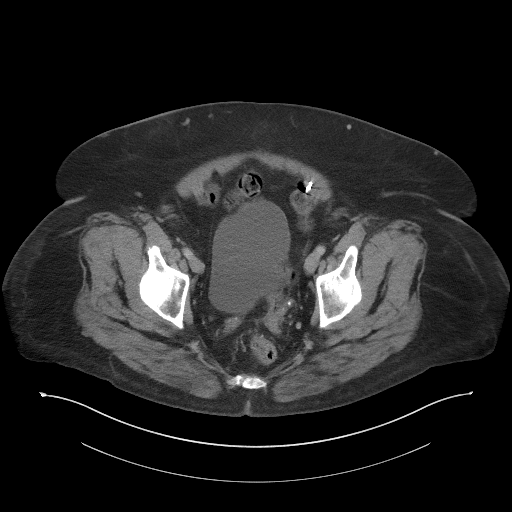
[im 27/102  soft-tissue]
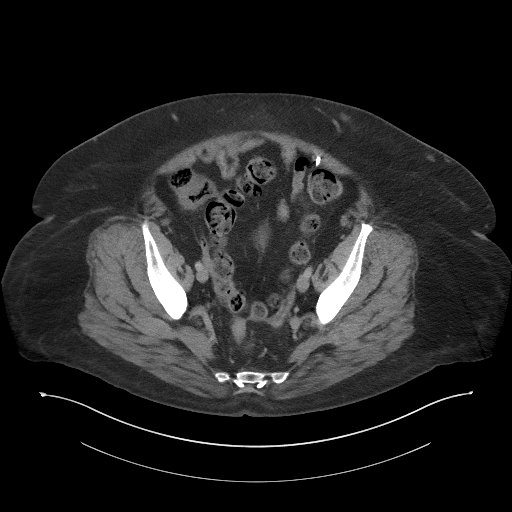
[im 38/102  soft-tissue]
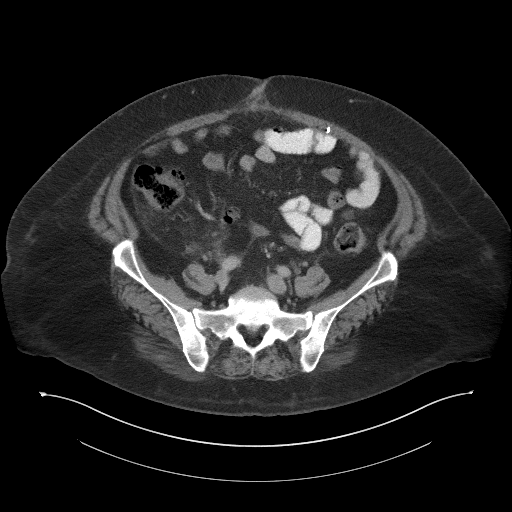
[im 43/102  soft-tissue]
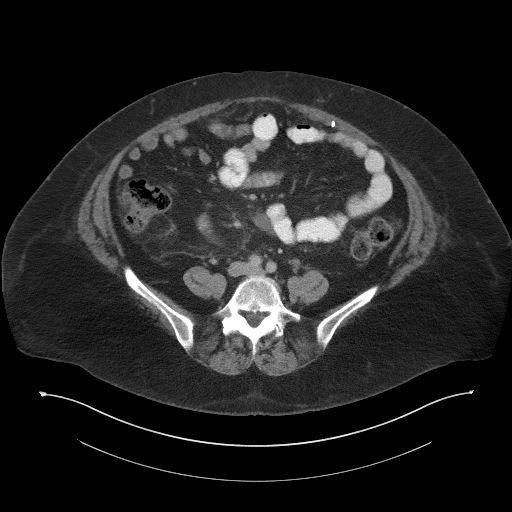
[im 54/102  soft-tissue]
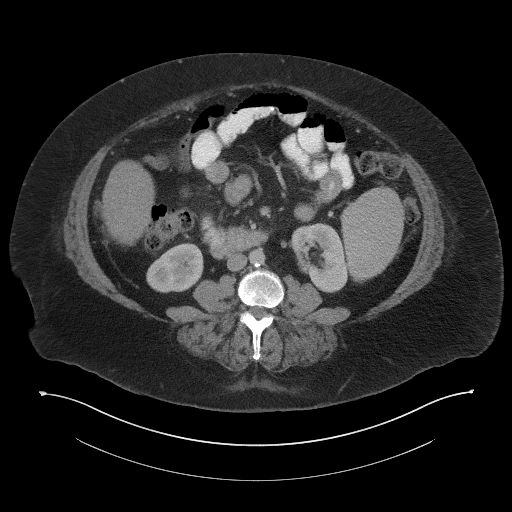
[im 59/102  soft-tissue]
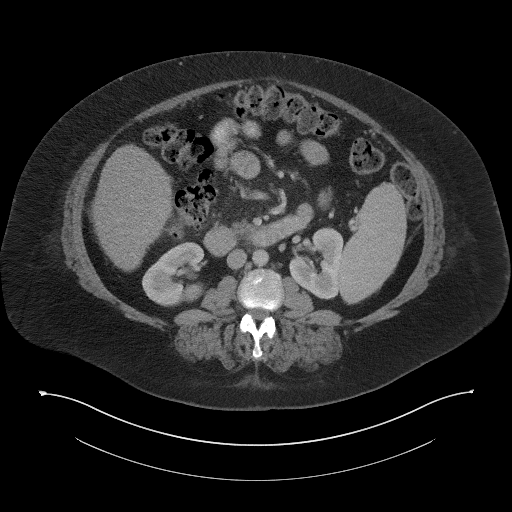
[im 64/102  soft-tissue]
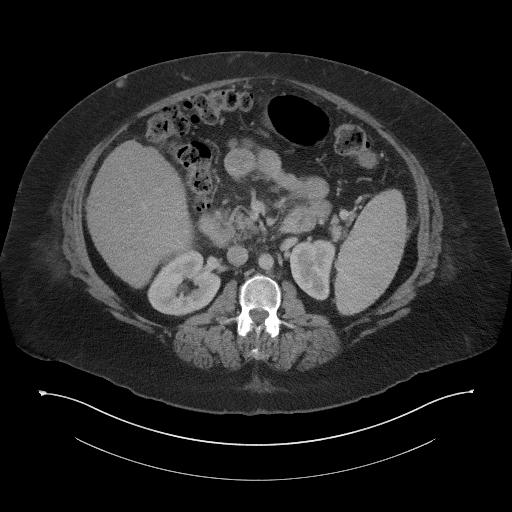
[im 64/102  bone]
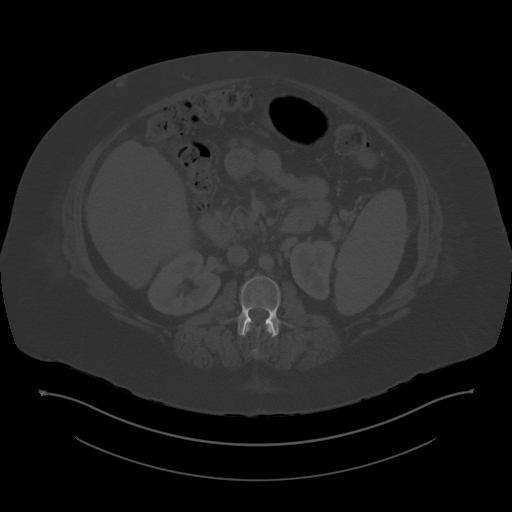
[im 75/102  soft-tissue]
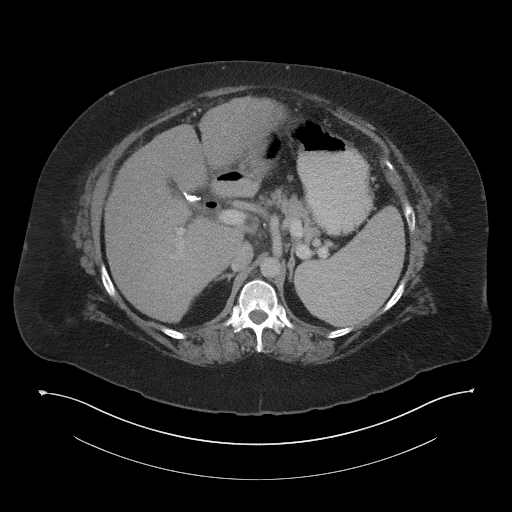
[im 80/102  soft-tissue]
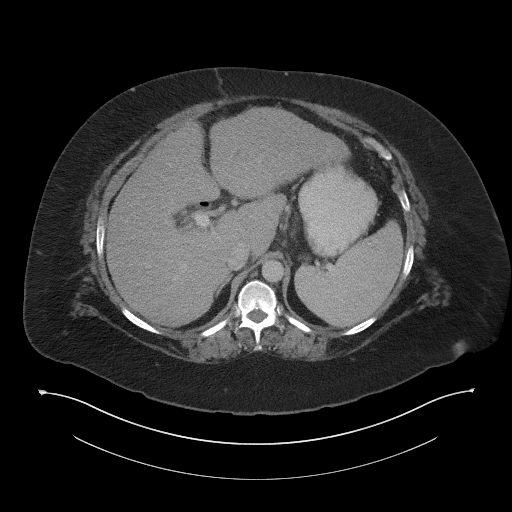
[im 86/102  soft-tissue]
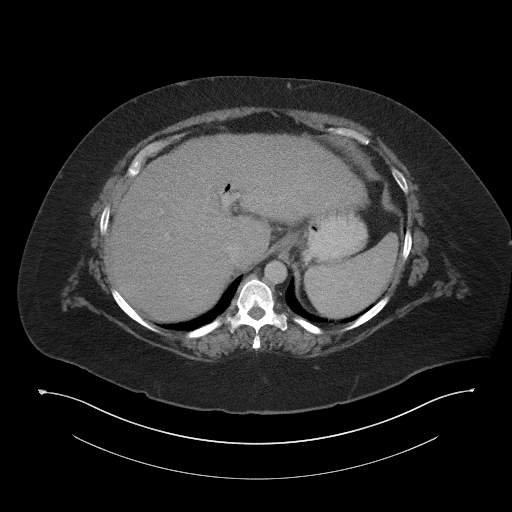
[im 96/102  soft-tissue]
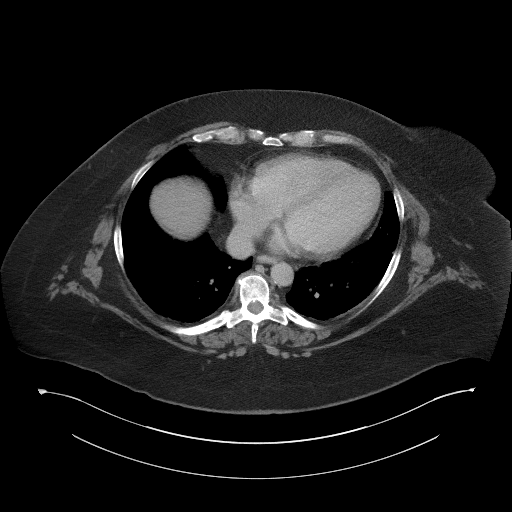

[Series 5: coronal st · coronal · 0.78mm/px · 3 of 109 slices shown]
[im 37/109  soft-tissue]
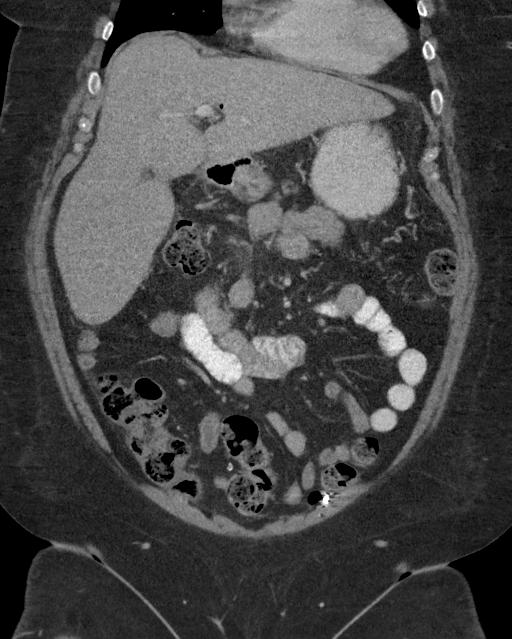
[im 49/109  soft-tissue]
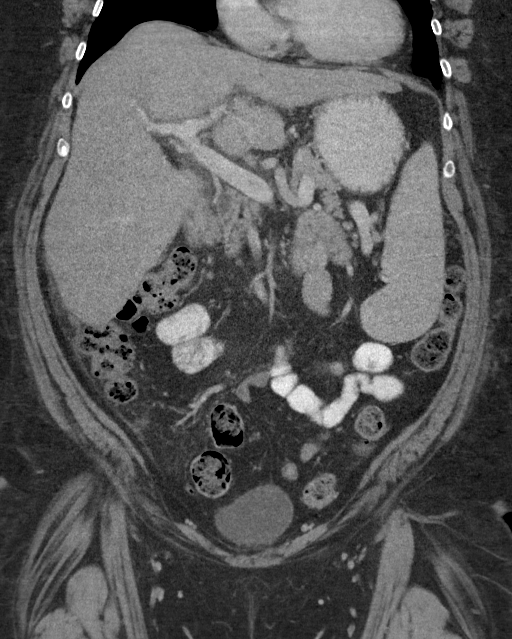
[im 61/109  soft-tissue]
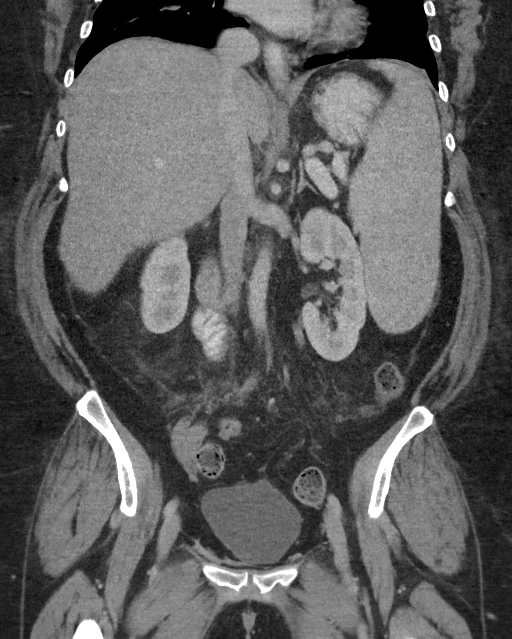

[16 of 46 positions shown; findings below may reference images not displayed]

FINDINGS: Lower chest: No acute abnormality.

Hepatobiliary: Status post cholecystectomy. Nodular hepatic contours
are noted consistent with hepatic cirrhosis. Stable hepatic cysts
are noted. Small amount a gas is seen in the common bile duct and
left hepatic ducts most likely due to incompetence of sphincter of
Oddi.

Pancreas: Unremarkable. No pancreatic ductal dilatation or
surrounding inflammatory changes.

Spleen: Moderate splenomegaly is noted without focal abnormality.

Adrenals/Urinary Tract: Adrenal glands are unremarkable. Kidneys are
normal, without renal calculi, focal lesion, or hydronephrosis.
Bladder is unremarkable.

Stomach/Bowel: Stomach is within normal limits. Appendix appears
normal. No evidence of bowel wall thickening, distention, or
inflammatory changes.

Vascular/Lymphatic: Aortic atherosclerosis. No enlarged abdominal or
pelvic lymph nodes.

Reproductive: Status post hysterectomy. No adnexal masses.

Other: No abdominal wall hernia or abnormality. No abdominopelvic
ascites.

Musculoskeletal: No acute or significant osseous findings.
IMPRESSION: Hepatic cirrhosis with moderate splenomegaly.

Normal appendix.

Aortic atherosclerosis.

## 2018-03-06 IMAGING — CT CT HEAD W/O CM
3 series · 15 of 46 positions shown, 18 images · non-contrast
Comparison: 09/23/2016

CLINICAL DATA: Altered mental status

EXAM:
CT HEAD WITHOUT CONTRAST
TECHNIQUE: Contiguous axial images were obtained from the base of the skull
through the vertex without intravenous contrast.

[Series 2: head wo · axial · 0.44mm/px · z∈[+387,+507]mm · 9 of 29 slices shown, 12 images]
[im 3/29  brain]
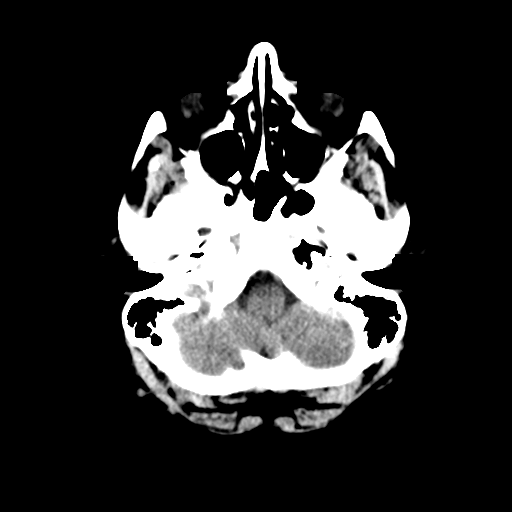
[im 3/29  bone]
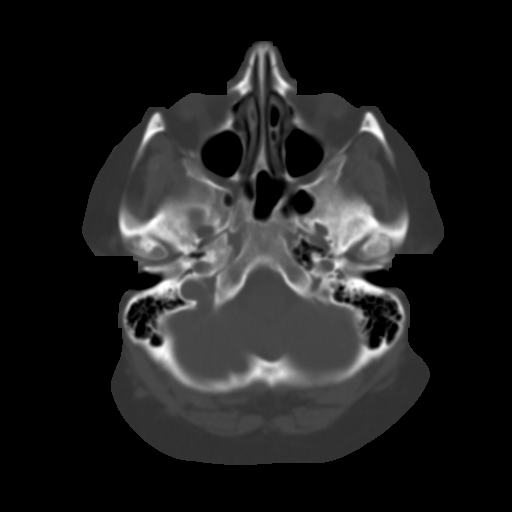
[im 6/29  brain]
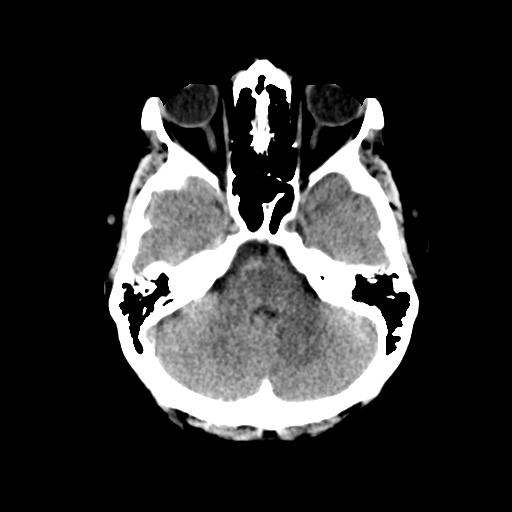
[im 9/29  brain]
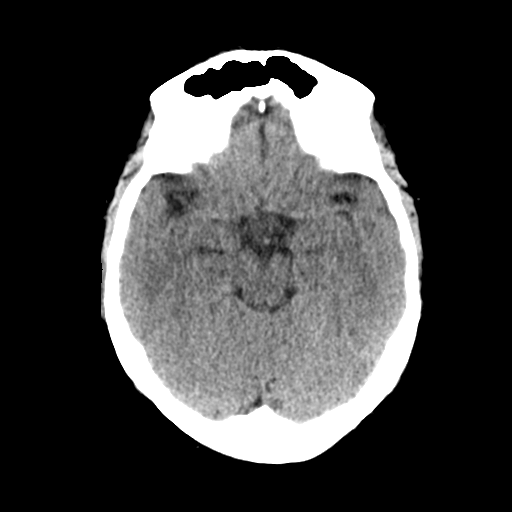
[im 12/29  brain]
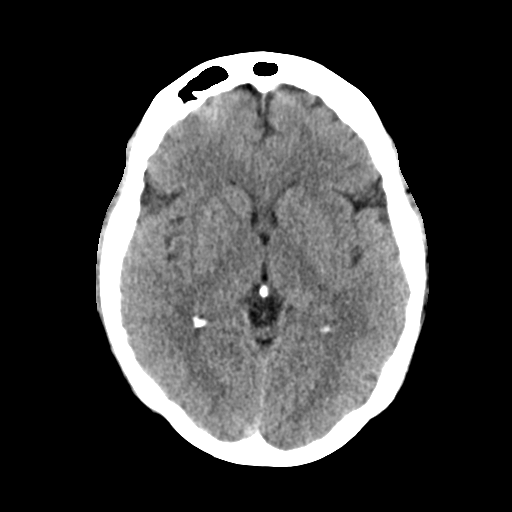
[im 15/29  brain]
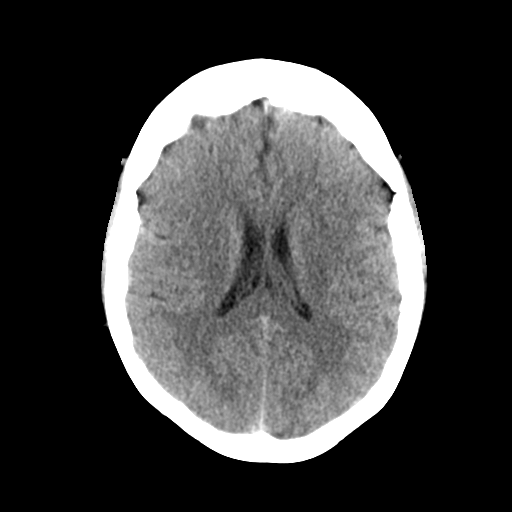
[im 15/29  bone]
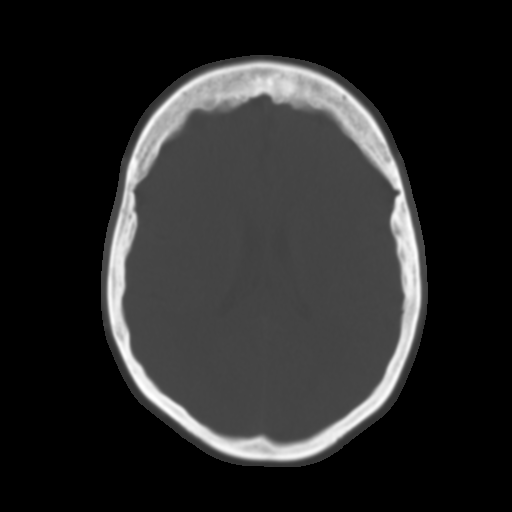
[im 18/29  brain]
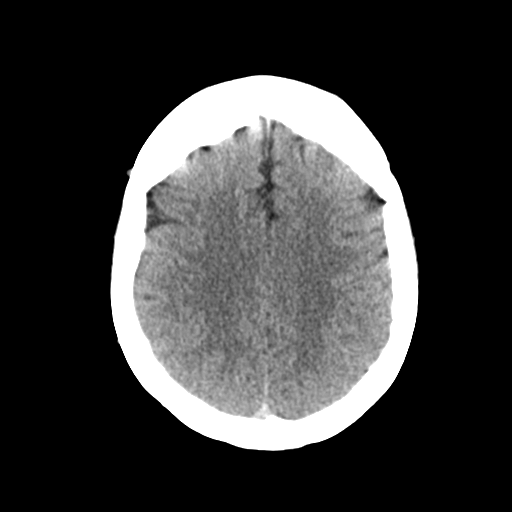
[im 21/29  brain]
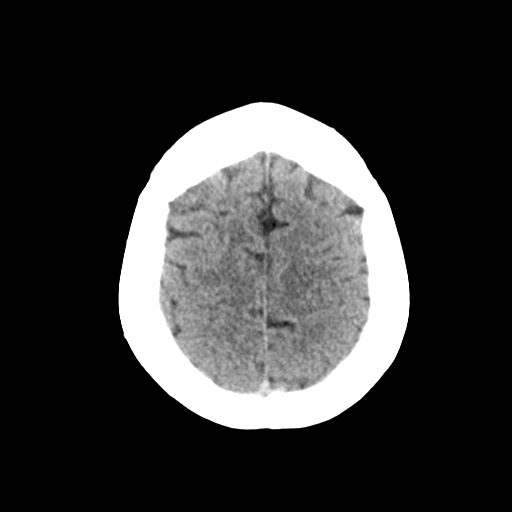
[im 24/29  brain]
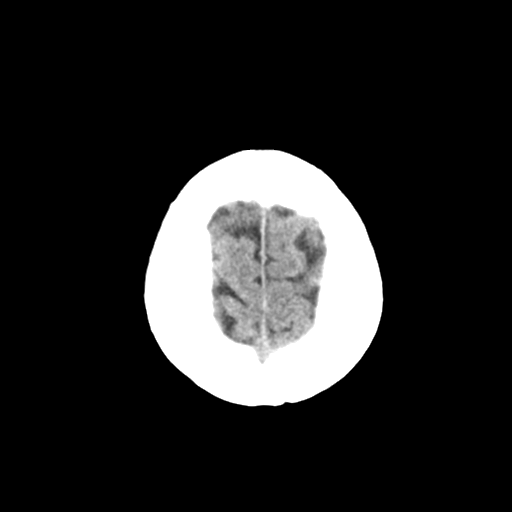
[im 27/29  brain]
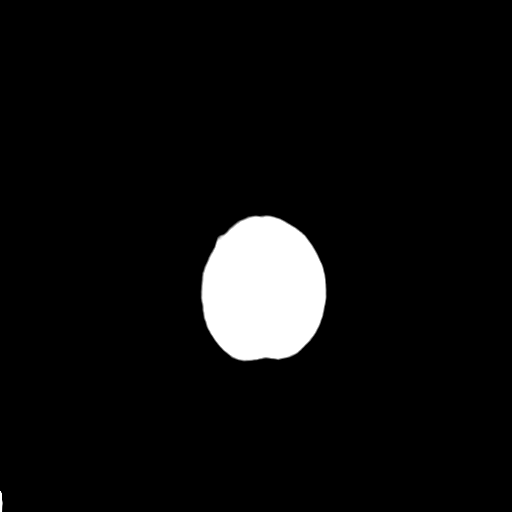
[im 27/29  bone]
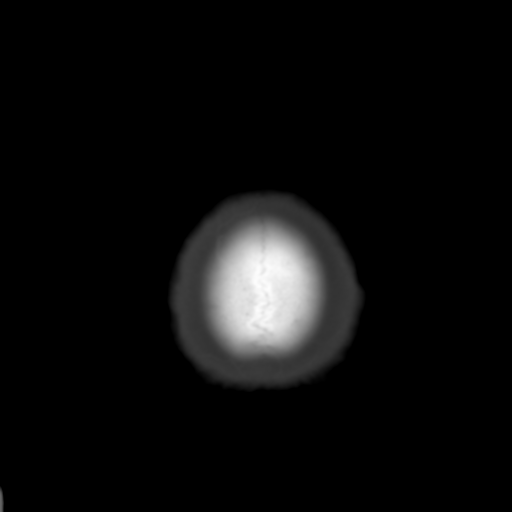

[Series 4: coronal soft tissue · coronal · 0.29mm/px · 3 of 64 slices shown]
[im 22/64  brain]
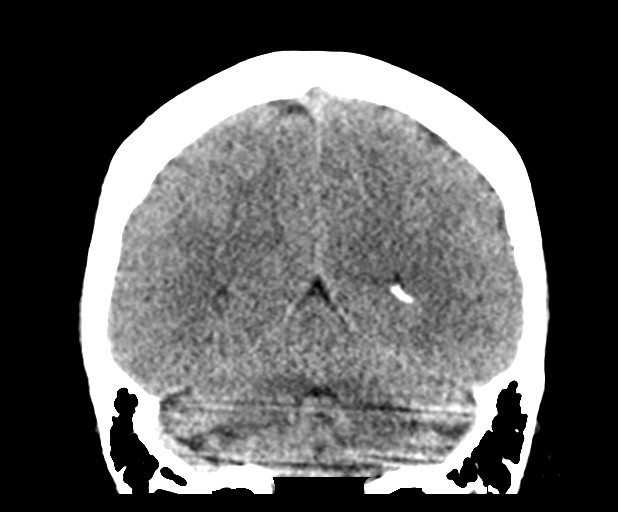
[im 29/64  brain]
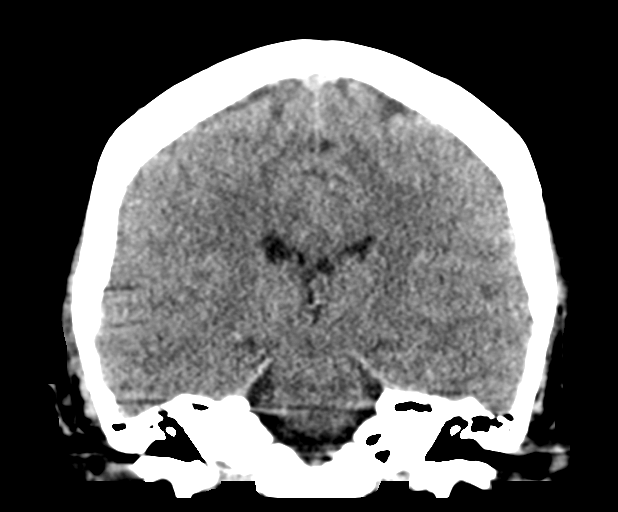
[im 36/64  brain]
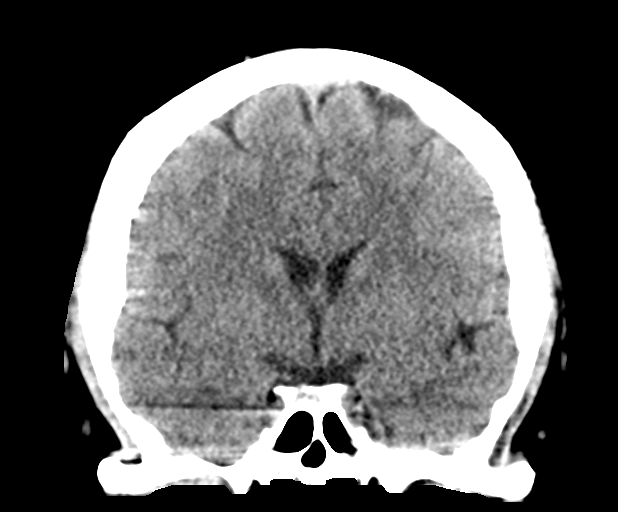

[Series 5: sagittal soft tissue · sagittal · 0.29mm/px · 3 of 51 slices shown]
[im 17/51  brain]
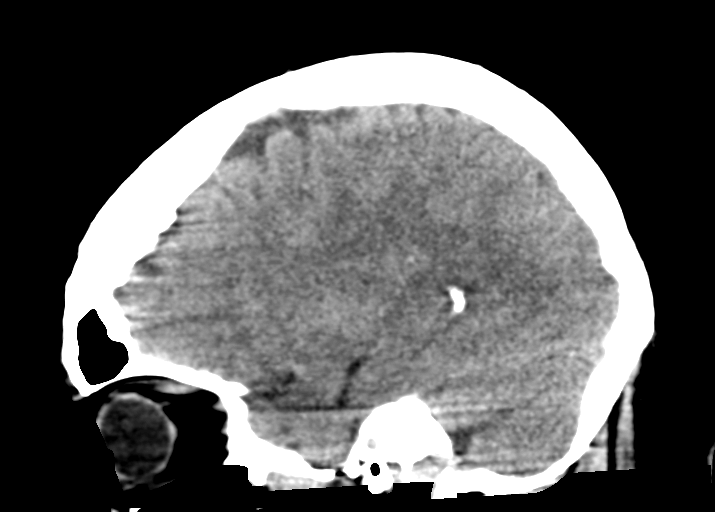
[im 26/51  brain]
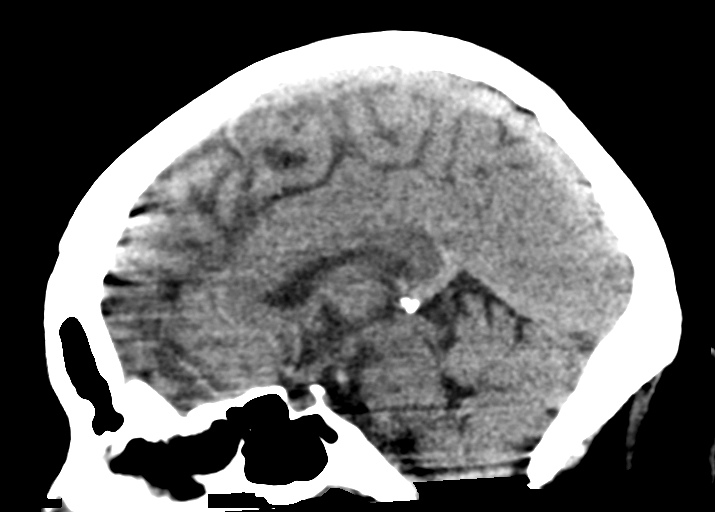
[im 34/51  brain]
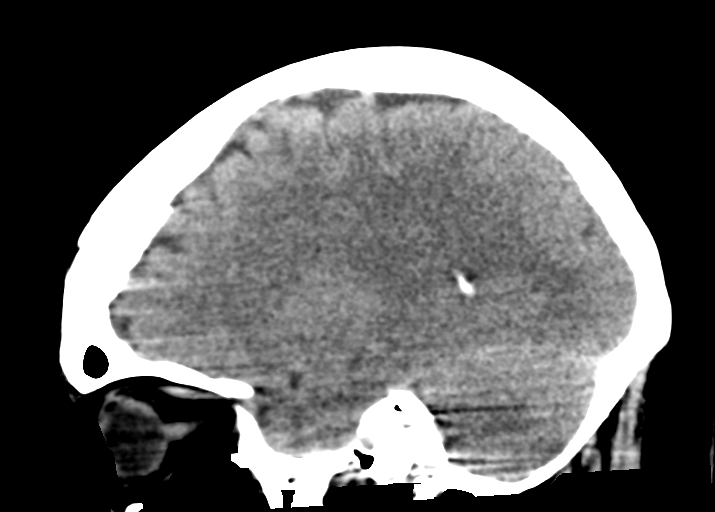

[15 of 46 positions shown; findings below may reference images not displayed]

FINDINGS: Brain: No evidence of acute infarction, hemorrhage, hydrocephalus,
extra-axial collection or mass lesion/mass effect.

Vascular: No hyperdense vessel or unexpected calcification.

Skull: Normal. Negative for fracture or focal lesion.

Sinuses/Orbits: No acute finding.

Other: None
IMPRESSION: No CT evidence for acute intracranial abnormality.

## 2018-04-30 IMAGING — CR DG CHEST 2V
1 series · 2 of 2 positions shown · non-contrast
Comparison: None.

CLINICAL DATA: Chest pain and blurry vision with nausea.

EXAM:
CHEST  2 VIEW

[Series 1: dg chest 2 view · 0.14mm/px · 2 of 2 slices shown]
[im 1/2]
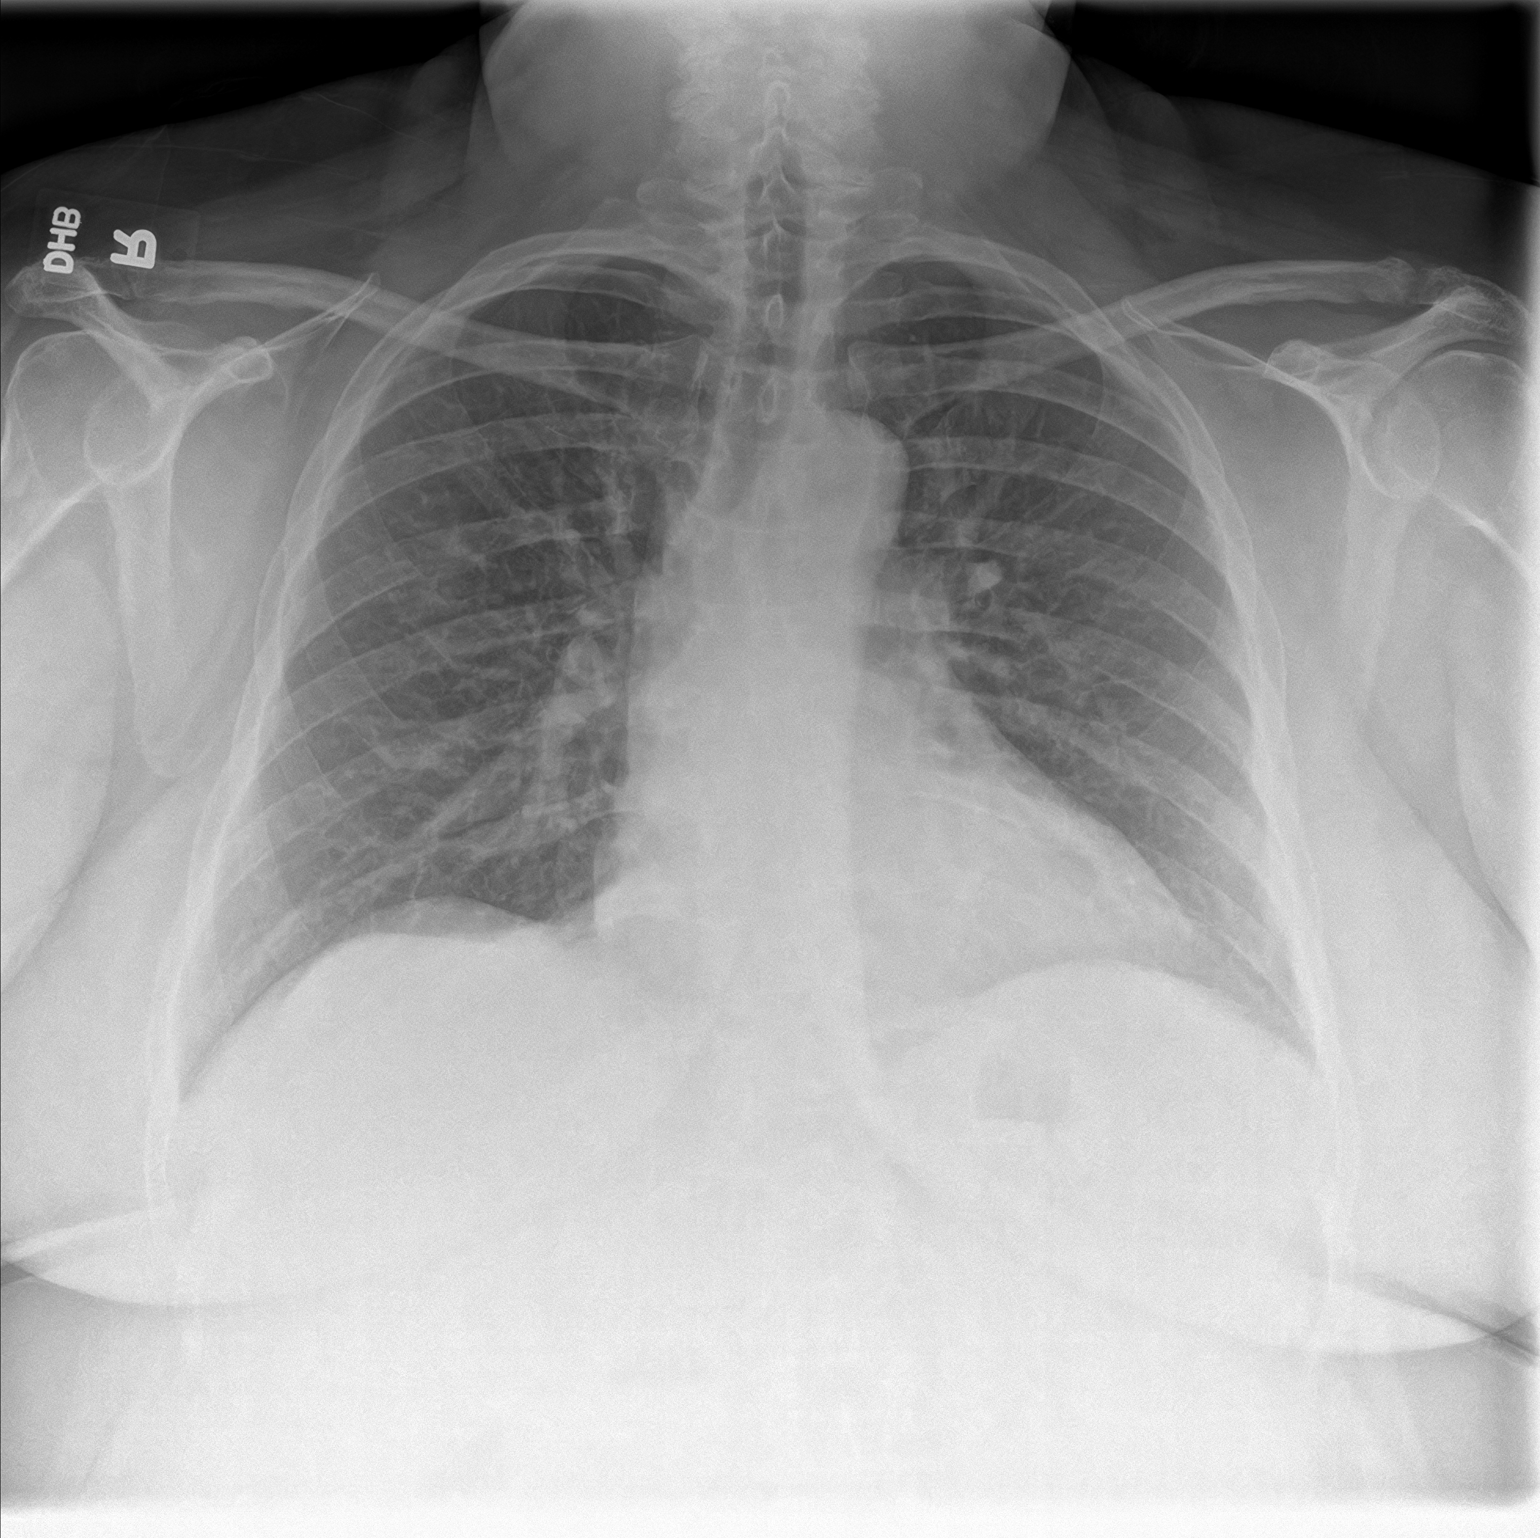
[im 2/2]
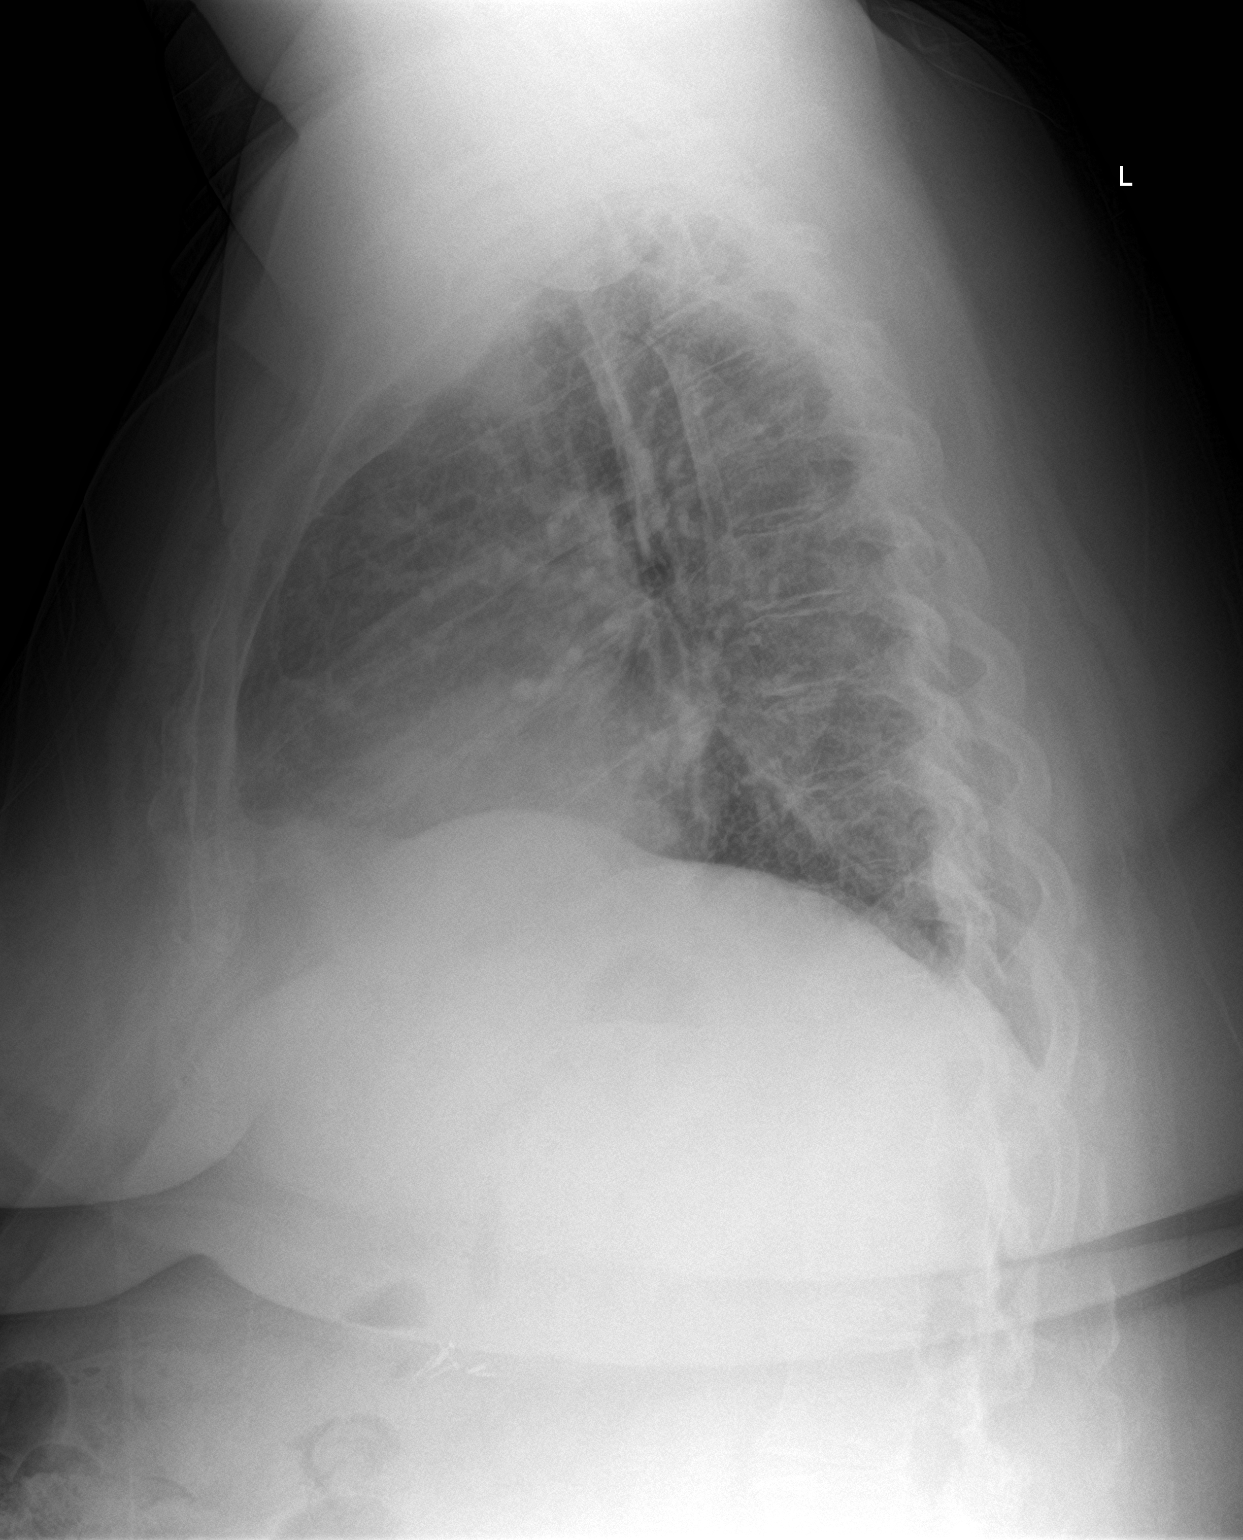

[2 of 2 positions shown; findings below may reference images not displayed]

FINDINGS: Stable borderline cardiomegaly. No aortic aneurysm. No alveolar
consolidation or pneumothorax. No pleural effusion. No overt
pulmonary edema. The visualized skeletal structures are
unremarkable.
IMPRESSION: No active cardiopulmonary disease.

## 2018-05-05 IMAGING — CR DG CHEST 2V
2 series · 2 of 2 positions shown · non-contrast
Comparison: 12/15/2016 .

CLINICAL DATA: Central chest tightness.  Radiation down left arm.

EXAM:
CHEST  2 VIEW

[chest pa]
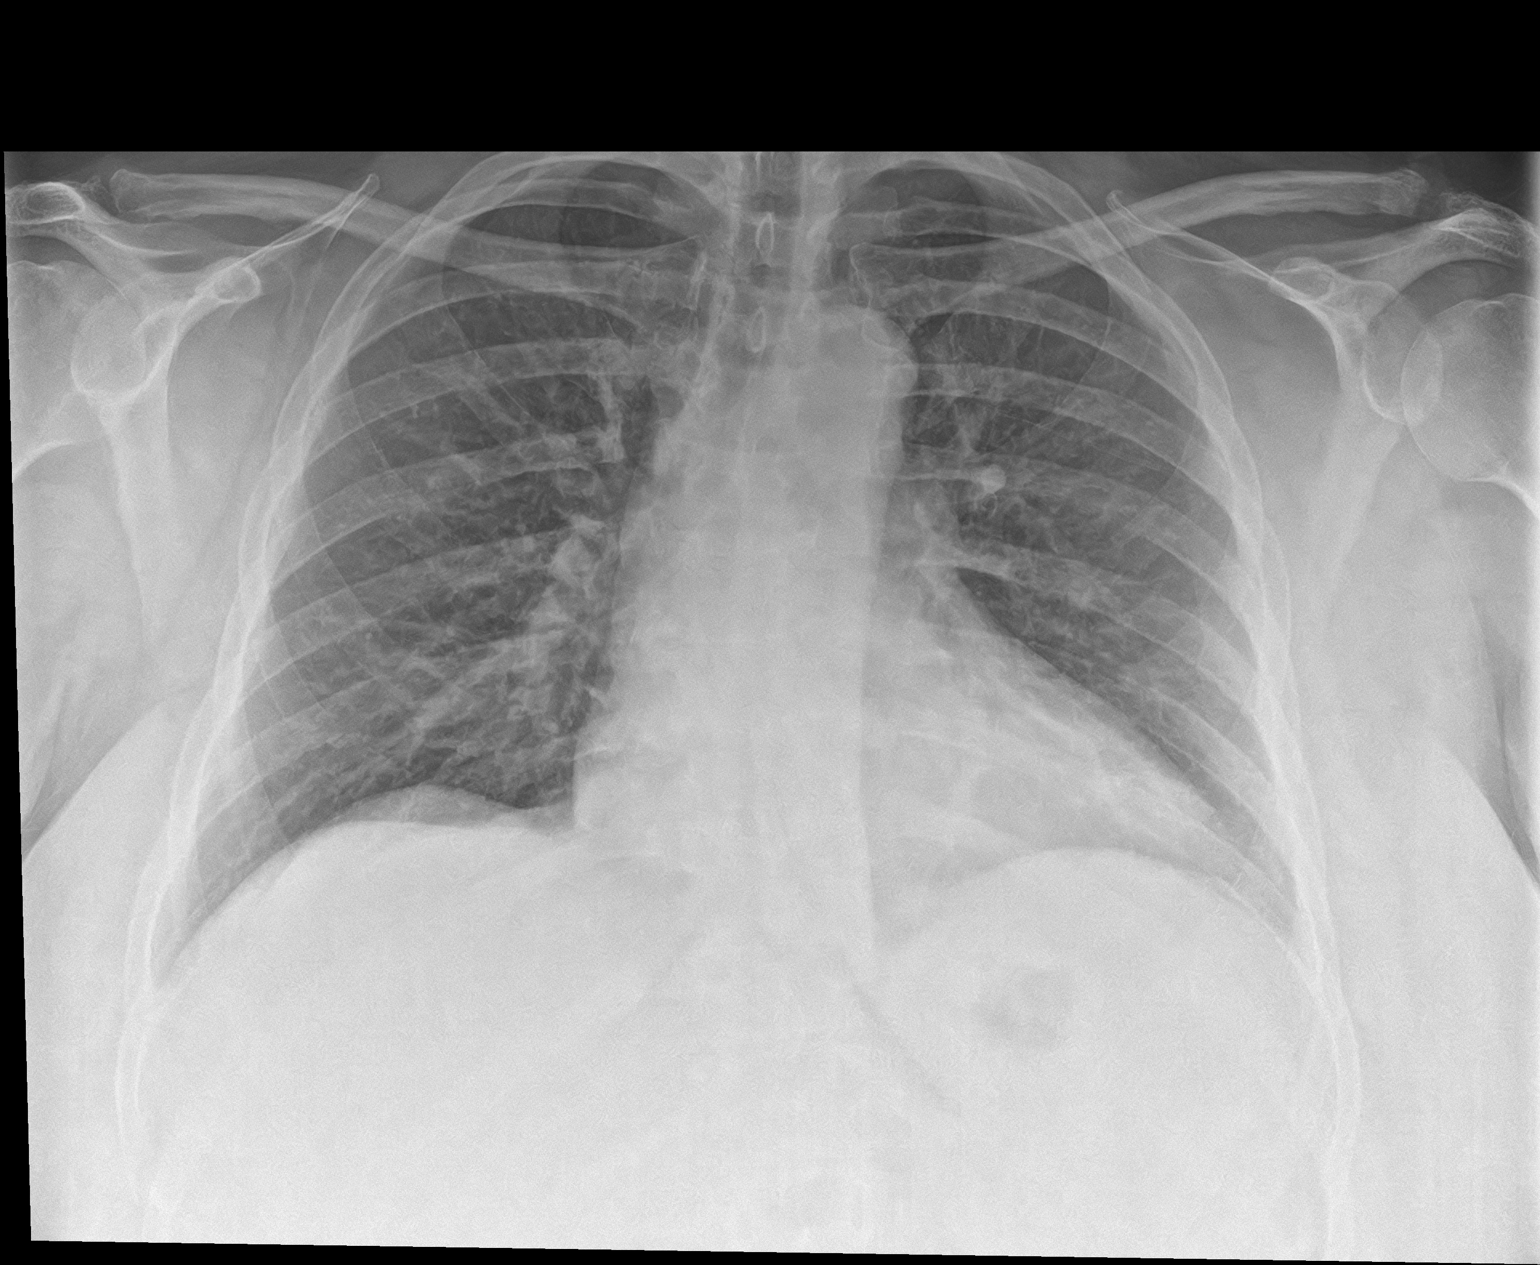

[chest lat]
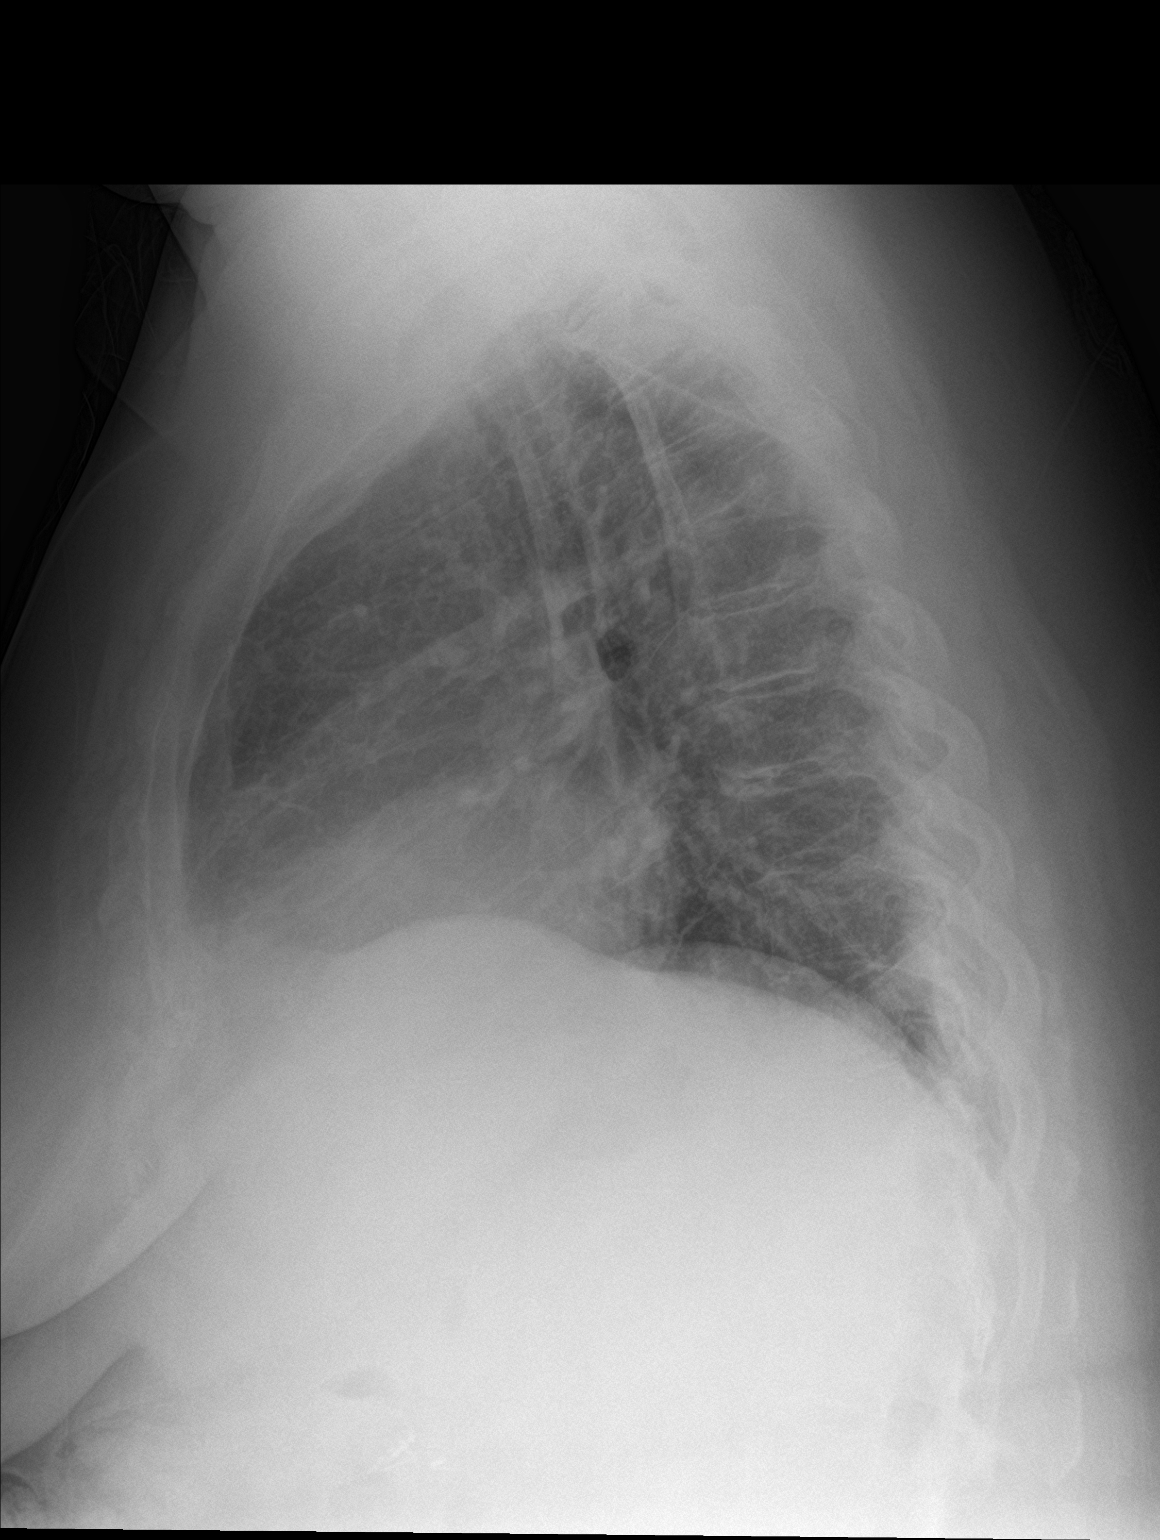

[2 of 2 positions shown; findings below may reference images not displayed]

FINDINGS: Mediastinum and hilar structures normal. Stable cardiomegaly. No
pulmonary infiltrate. Low lung volumes. No pleural effusion or
pneumothorax. No acute bony abnormality
IMPRESSION: 1. Stable cardiomegaly.

2. Low lung volumes.  No acute pulmonary infiltrate.

## 2018-07-04 IMAGING — CR DG CHEST 2V
1 series · 2 of 2 positions shown · non-contrast
Comparison: December 20, 2016

CLINICAL DATA: Hyperglycemia.  Chest pain and back pain.

EXAM:
CHEST  2 VIEW

[Series 1: dg chest 2 view · 0.14mm/px · 2 of 2 slices shown]
[im 1/2]
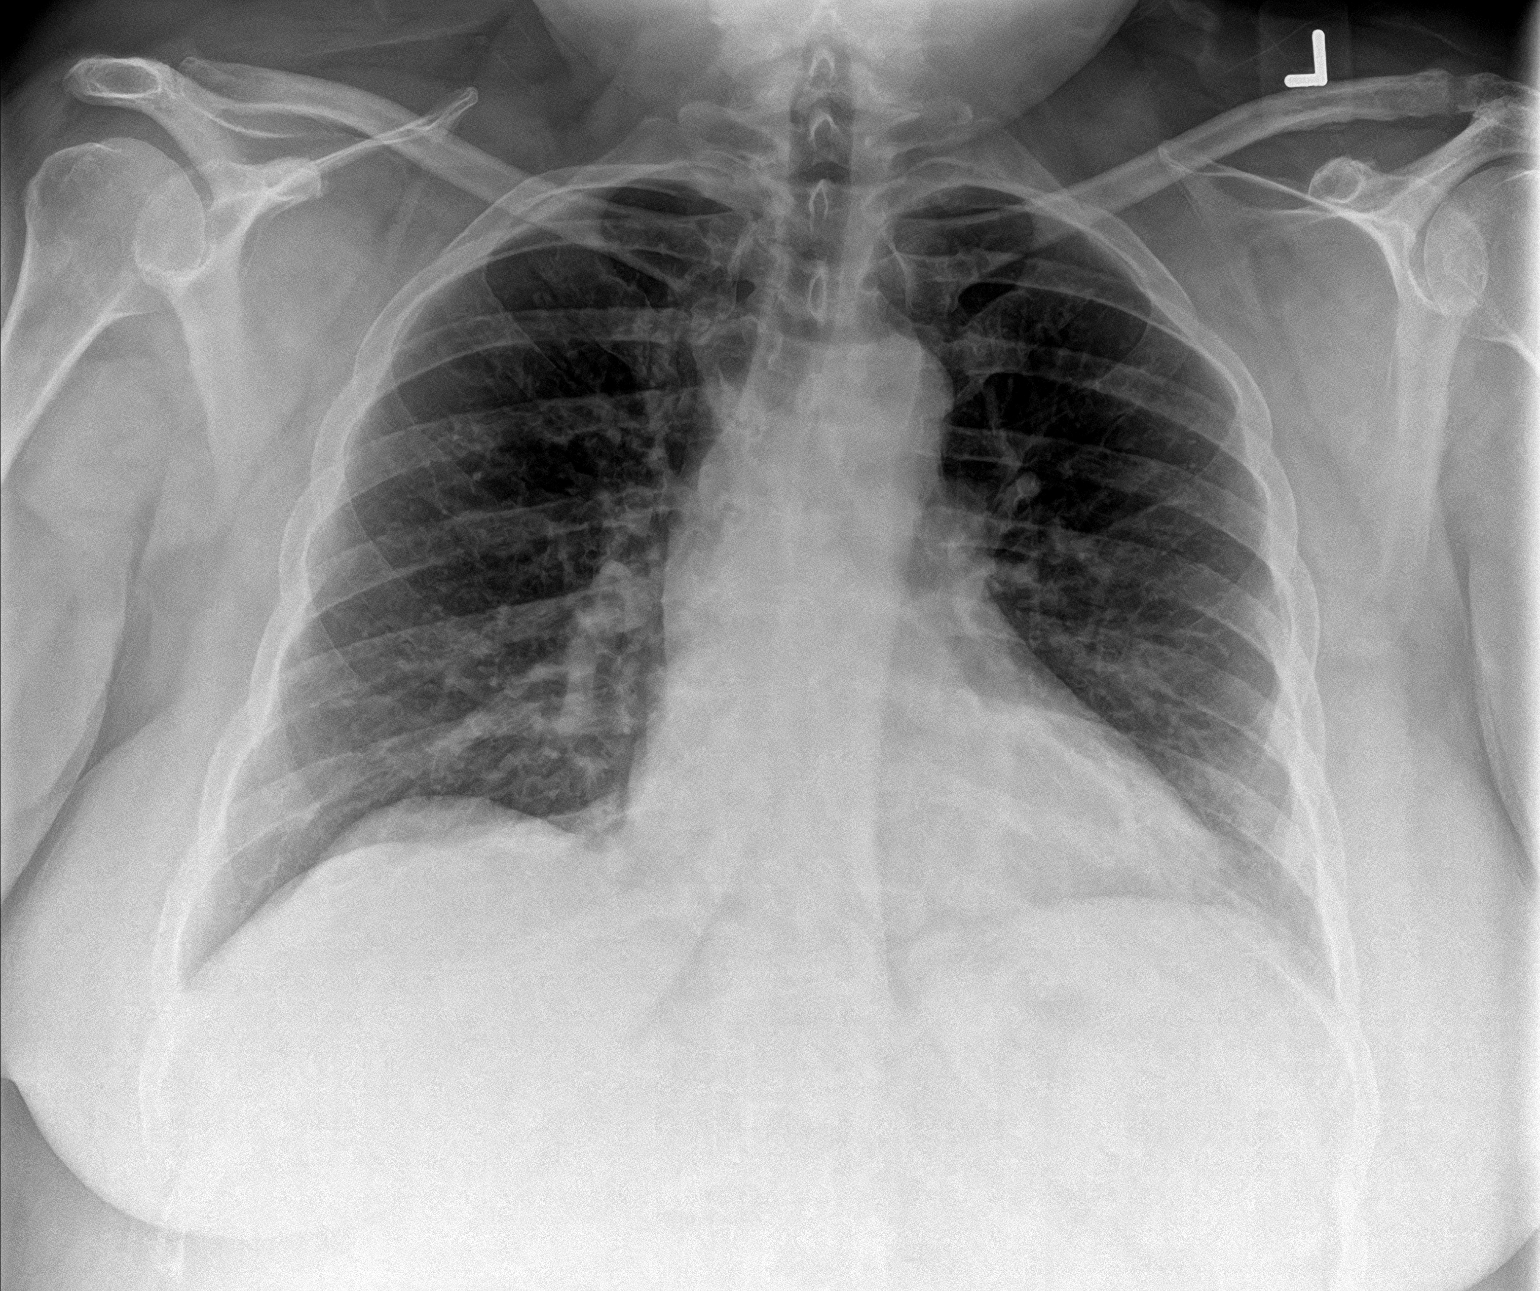
[im 2/2]
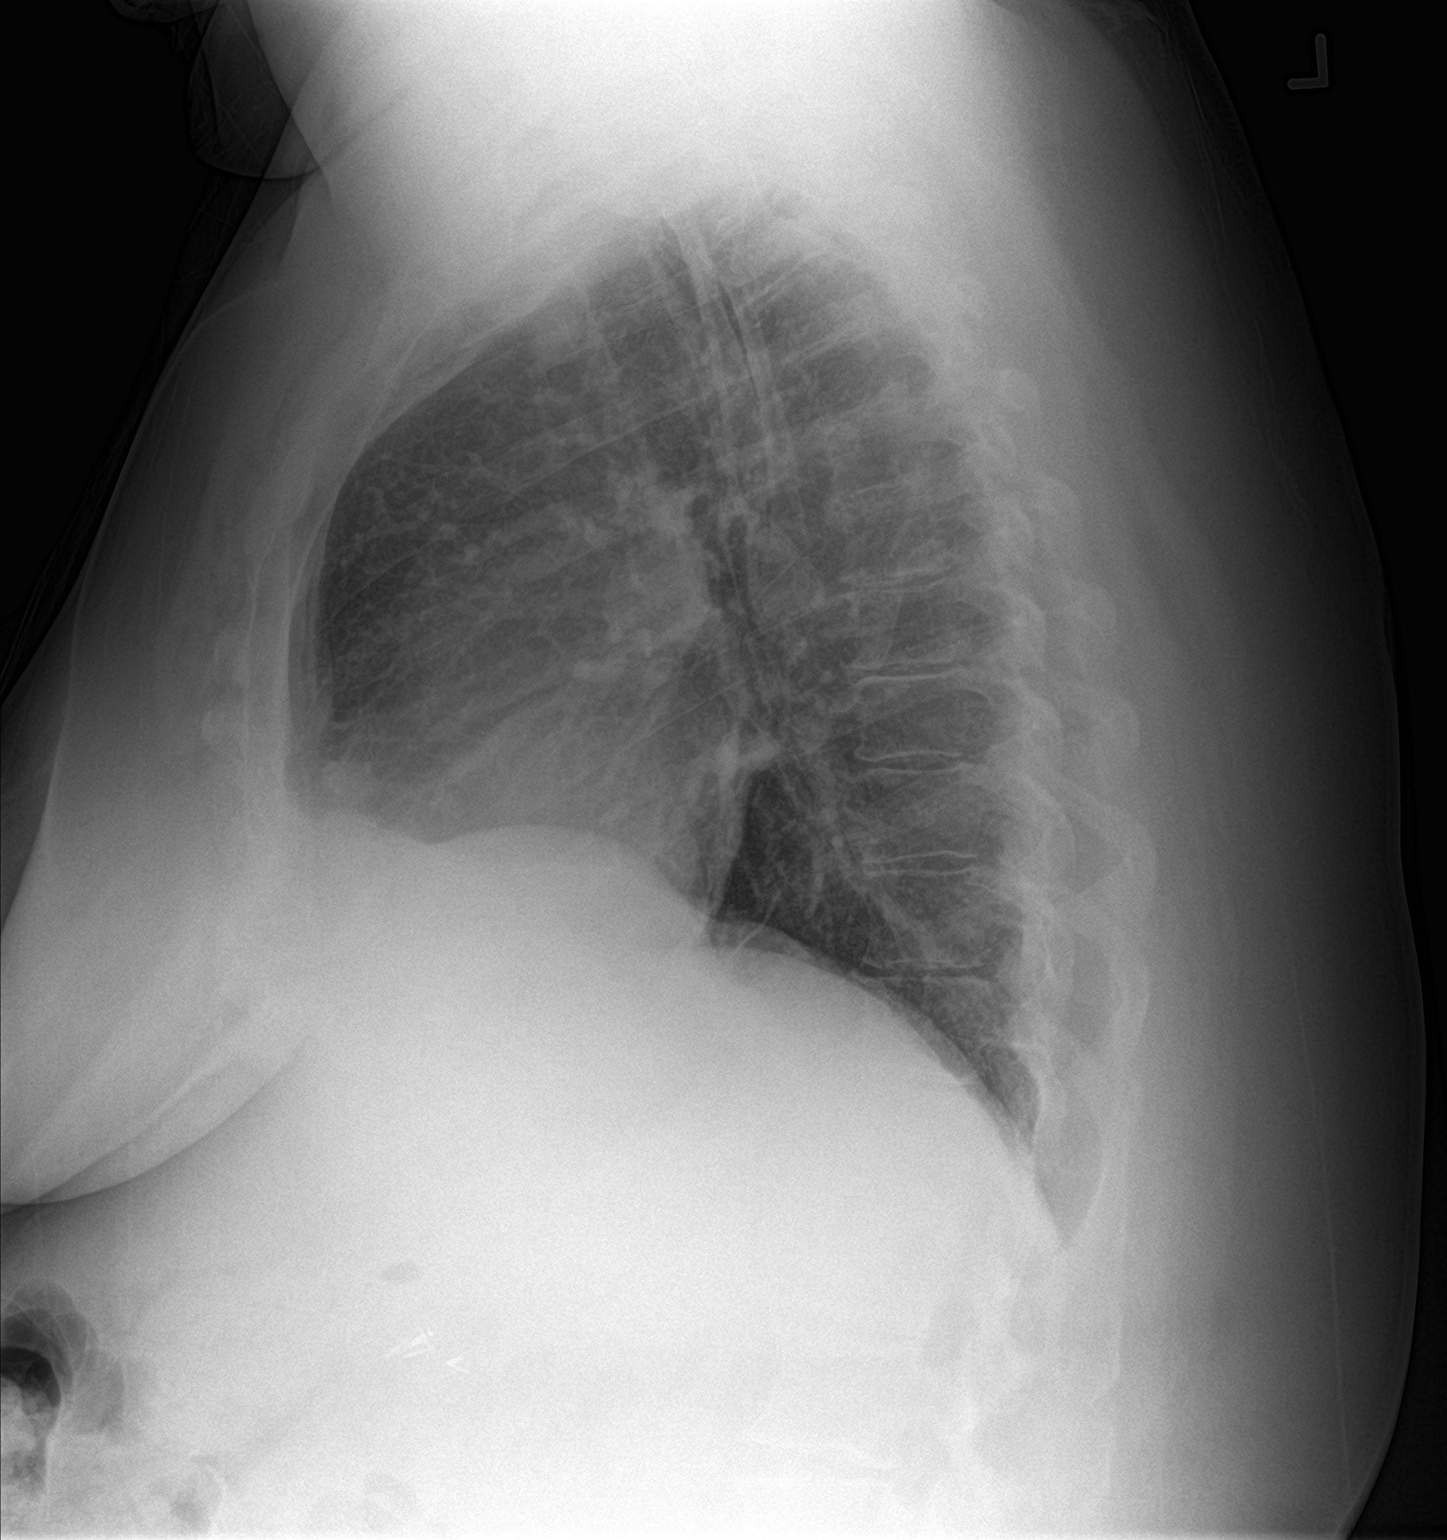

[2 of 2 positions shown; findings below may reference images not displayed]

FINDINGS: The heart size and mediastinal contours are within normal limits.
Both lungs are clear. The visualized skeletal structures are
unremarkable.
IMPRESSION: No active cardiopulmonary disease.

## 2018-07-04 IMAGING — CT CT ABD-PELV W/ CM
2 of 5 series · 16 of 46 positions shown, 18 images · IV contrast (APPLIED)
Comparison: 10/02/2016 CT of abdomen and pelvis.

CLINICAL DATA: 51 y/o F; chest pain, back pain, right kidney pain,
nausea, vomiting, and epigastric pain radiating to the back.

EXAM:
CT ABDOMEN AND PELVIS WITH CONTRAST
TECHNIQUE: Multidetector CT imaging of the abdomen and pelvis was performed
using the standard protocol following bolus administration of
intravenous contrast.
CONTRAST:  100mL [id number] IOPAMIDOL ([id number]) INJECTION 61%

[Series 2: routine abd/pel with · axial · 0.82mm/px · z∈[-802,-362]mm · 13 of 100 slices shown, 15 images]
[im 6/100  soft-tissue]
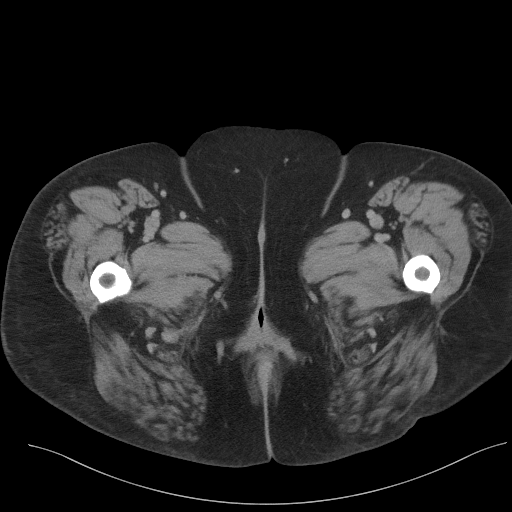
[im 6/100  bone]
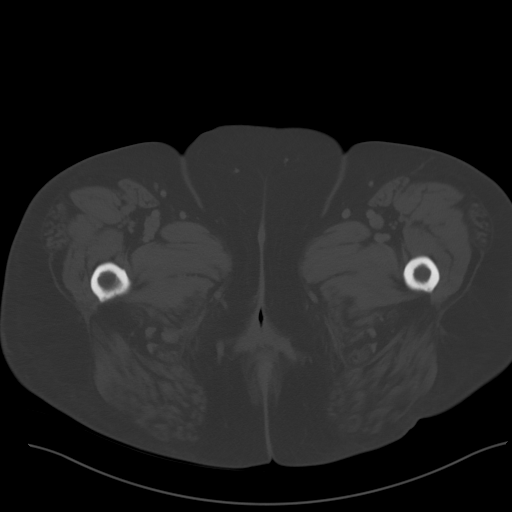
[im 12/100  soft-tissue]
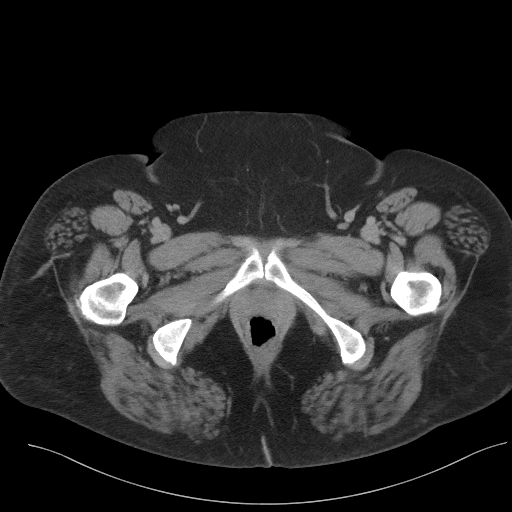
[im 23/100  soft-tissue]
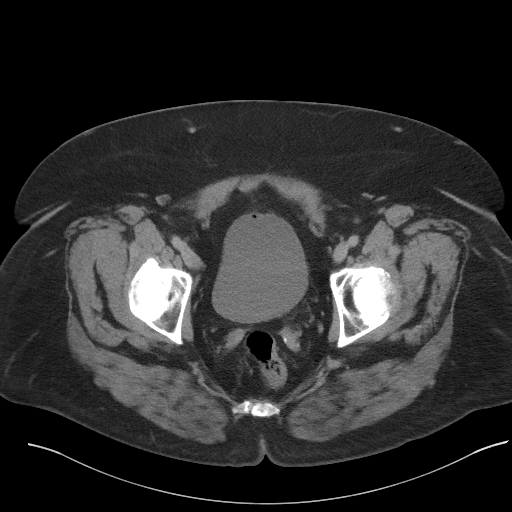
[im 28/100  soft-tissue]
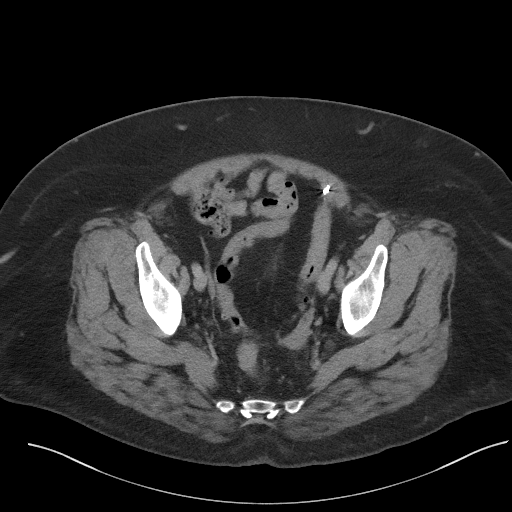
[im 34/100  soft-tissue]
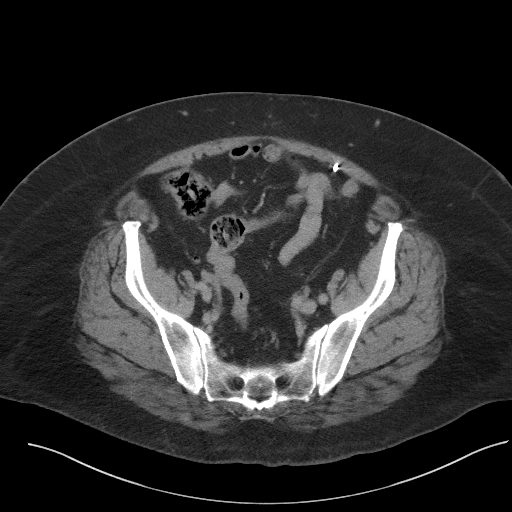
[im 45/100  soft-tissue]
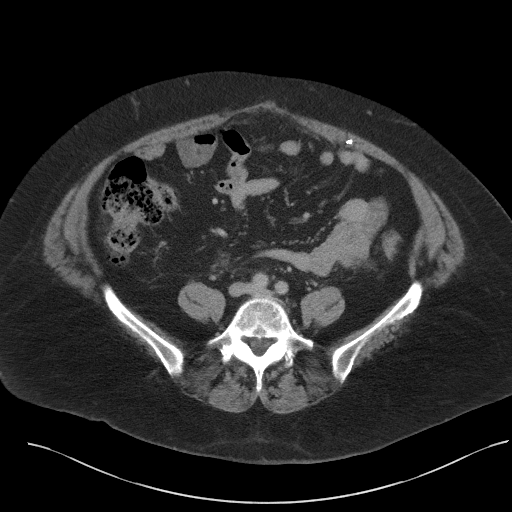
[im 50/100  soft-tissue]
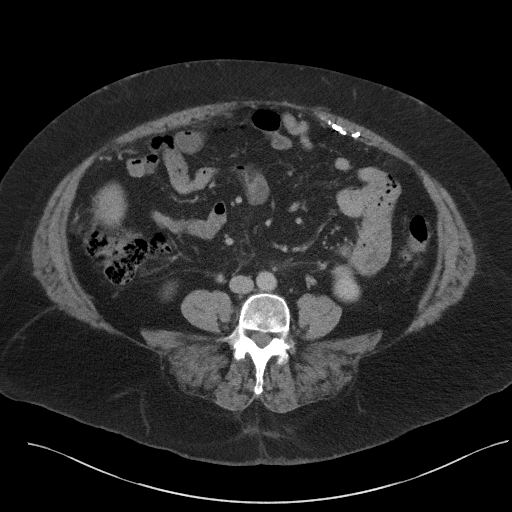
[im 56/100  soft-tissue]
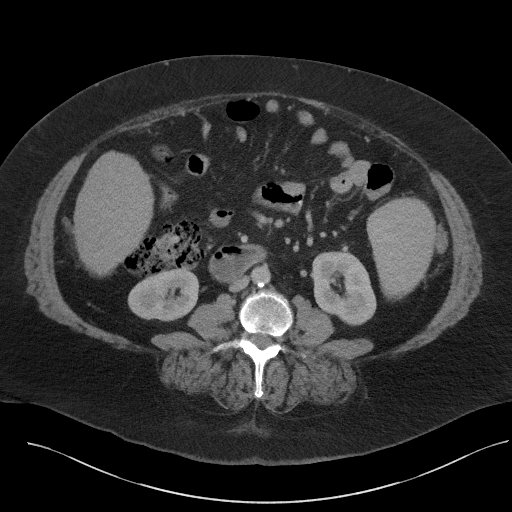
[im 67/100  soft-tissue]
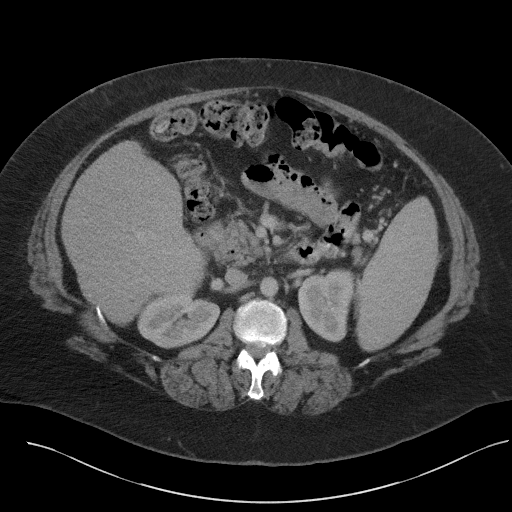
[im 67/100  bone]
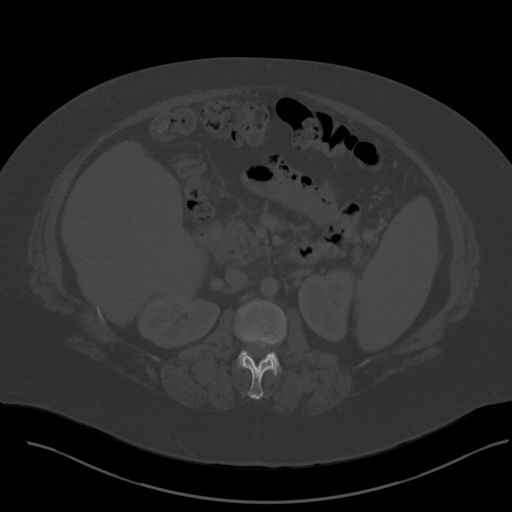
[im 72/100  soft-tissue]
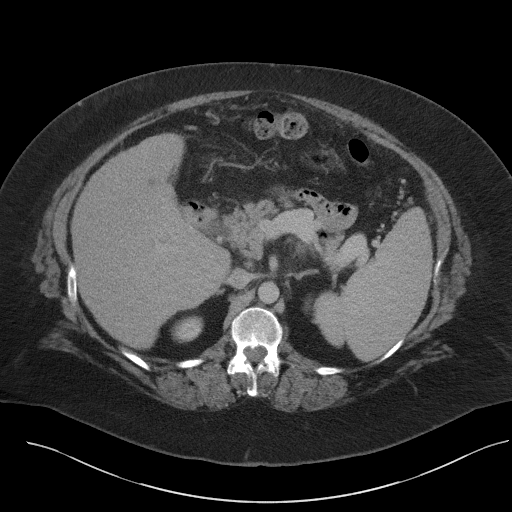
[im 78/100  soft-tissue]
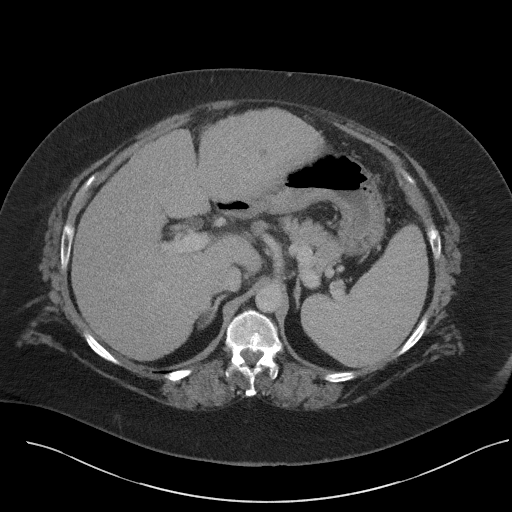
[im 89/100  soft-tissue]
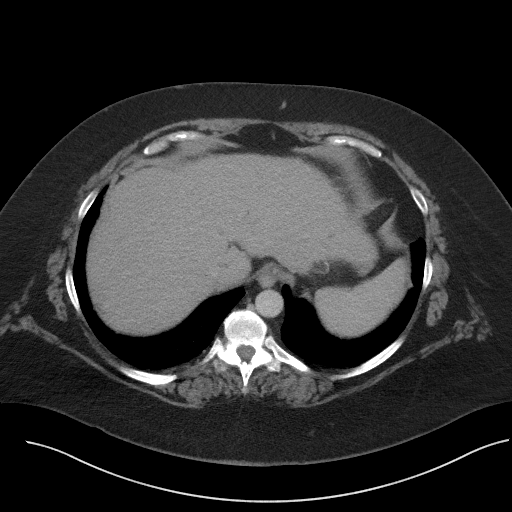
[im 94/100  soft-tissue]
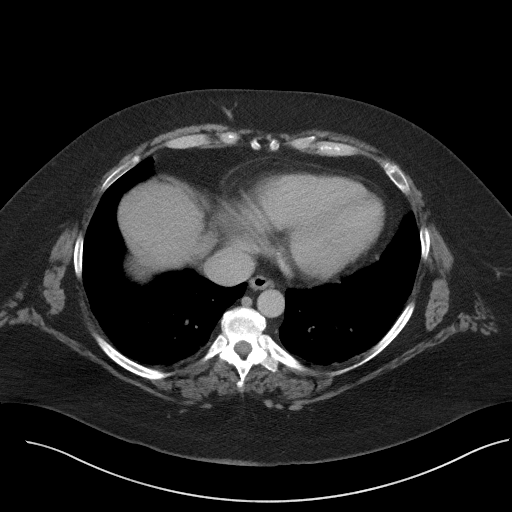

[Series 5: coronal st · coronal · 0.84mm/px · 3 of 106 slices shown]
[im 36/106  soft-tissue]
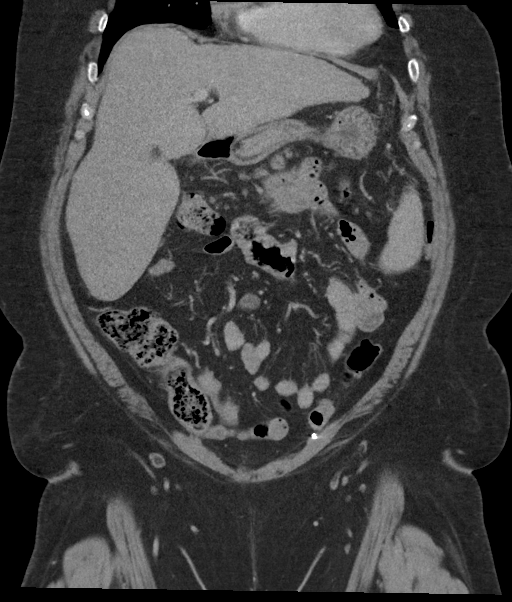
[im 47/106  soft-tissue]
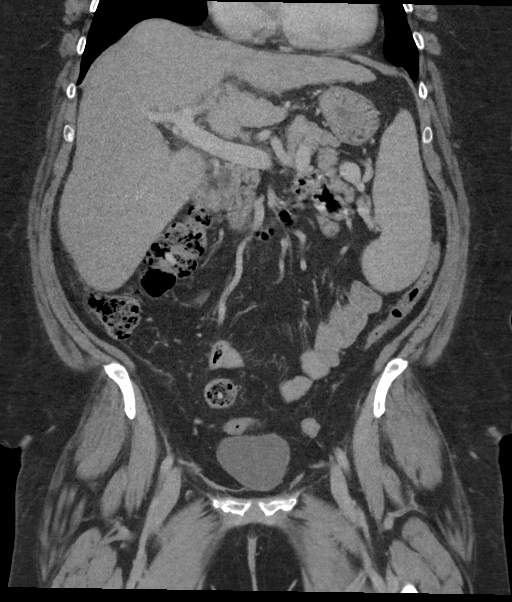
[im 59/106  soft-tissue]
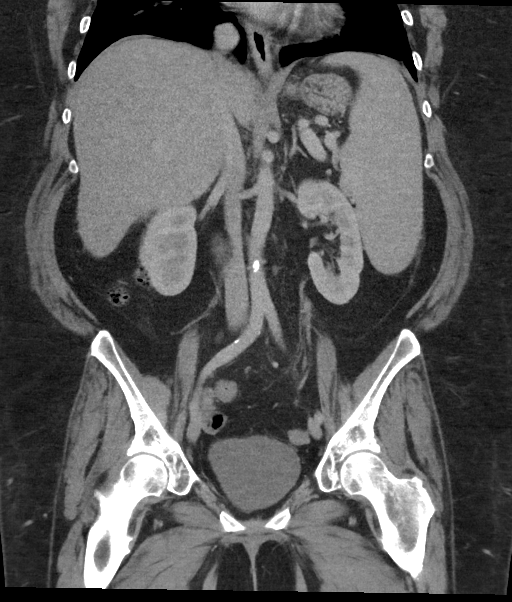

[16 of 46 positions shown; findings below may reference images not displayed]

FINDINGS: Lower chest: No acute abnormality.

Hepatobiliary: Cholecystectomy. Cirrhotic liver. Stable hepatic
cysts. Interval resolution of pneumobilia. Common bile duct measures
up to 10 mm, likely compensatory post cholecystectomy. No
intrahepatic biliary ductal dilatation.

Pancreas: Unremarkable. No pancreatic ductal dilatation or
surrounding inflammatory changes.

Spleen: Spleen measures 12.6 x 6.5 x 19.2 Cm (volume = 820
cm^3)(series 5, image 57 and series 2, image 27), previously
x 6.9 x 20.1 cm (volume = 940 cm^3) measured in similar fashion.

Adrenals/Urinary Tract: Adrenal glands are unremarkable. Kidneys are
normal, without renal calculi, focal lesion, or hydronephrosis. Air
within the bladder, question recent instrumentation versus cystitis.

Stomach/Bowel: Stomach is within normal limits. Appendix appears
normal. No evidence of bowel wall thickening, distention, or
inflammatory changes. Scattered sigmoid diverticulosis.

Vascular/Lymphatic: Aortic atherosclerosis. No enlarged abdominal or
pelvic lymph nodes.

Reproductive: Status post hysterectomy. No adnexal masses.

Other: Stable chronic postsurgical changes within the left lower
anterior abdominal wall.

Musculoskeletal: No fracture is seen. Mild lumbar levocurvature with
apex at L3.
IMPRESSION: 1. Cirrhotic liver. Mild decrease in splenomegaly 820 cc, previously
940 cc.
2. Air within bladder, question recent instrumentation versus
cystitis.
3. Scattered sigmoid diverticulosis, no findings of acute
diverticulitis.

By: Denddy Mah M.D.

## 2018-07-12 IMAGING — CR DG CHEST 2V
1 series · 2 of 2 positions shown · non-contrast
Comparison: 02/18/2017

CLINICAL DATA: Chest pain and shortness of breath beginning today.
Vomiting.

EXAM:
CHEST  2 VIEW

[Series 1: dg chest 2 view · 0.14mm/px · 2 of 2 slices shown]
[im 1/2]
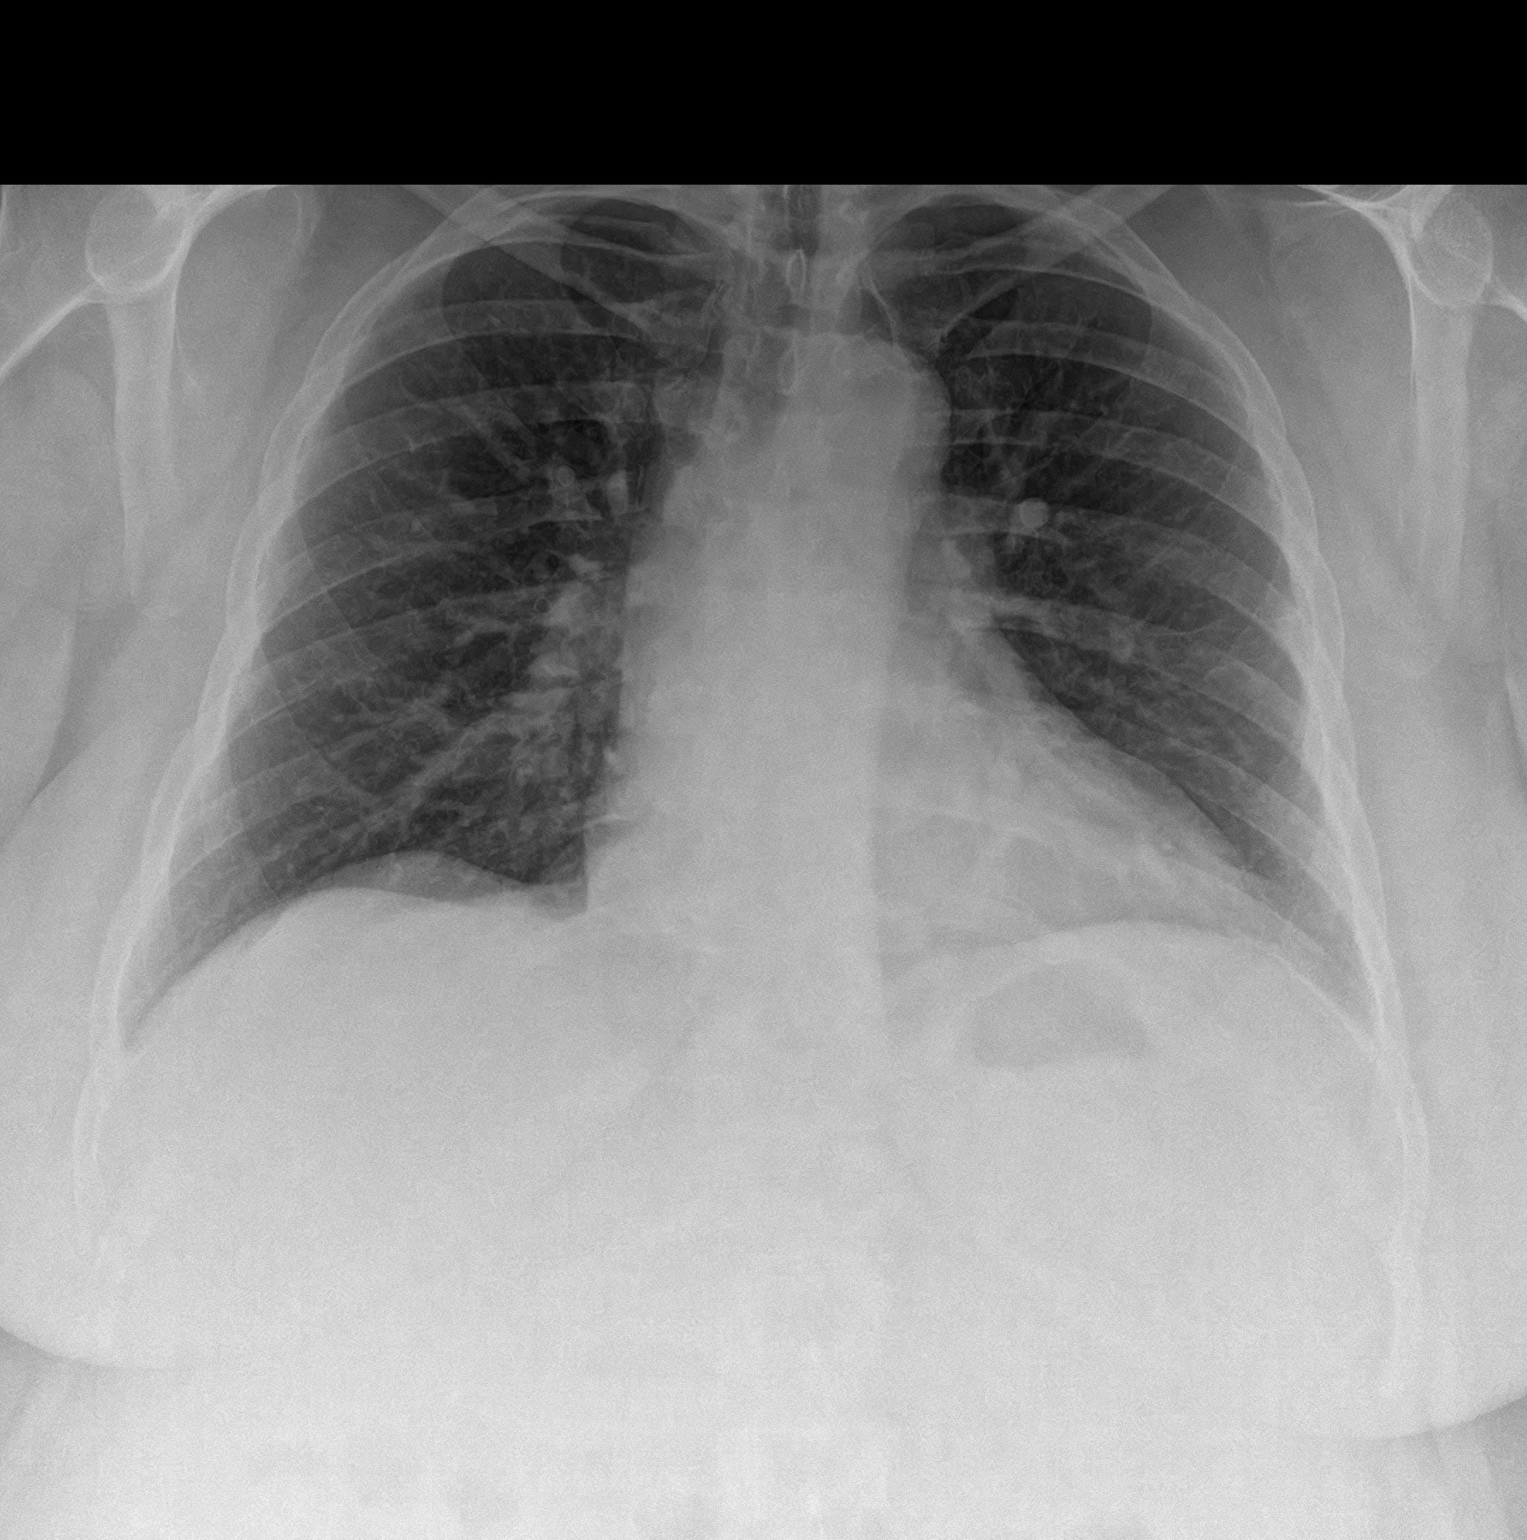
[im 2/2]
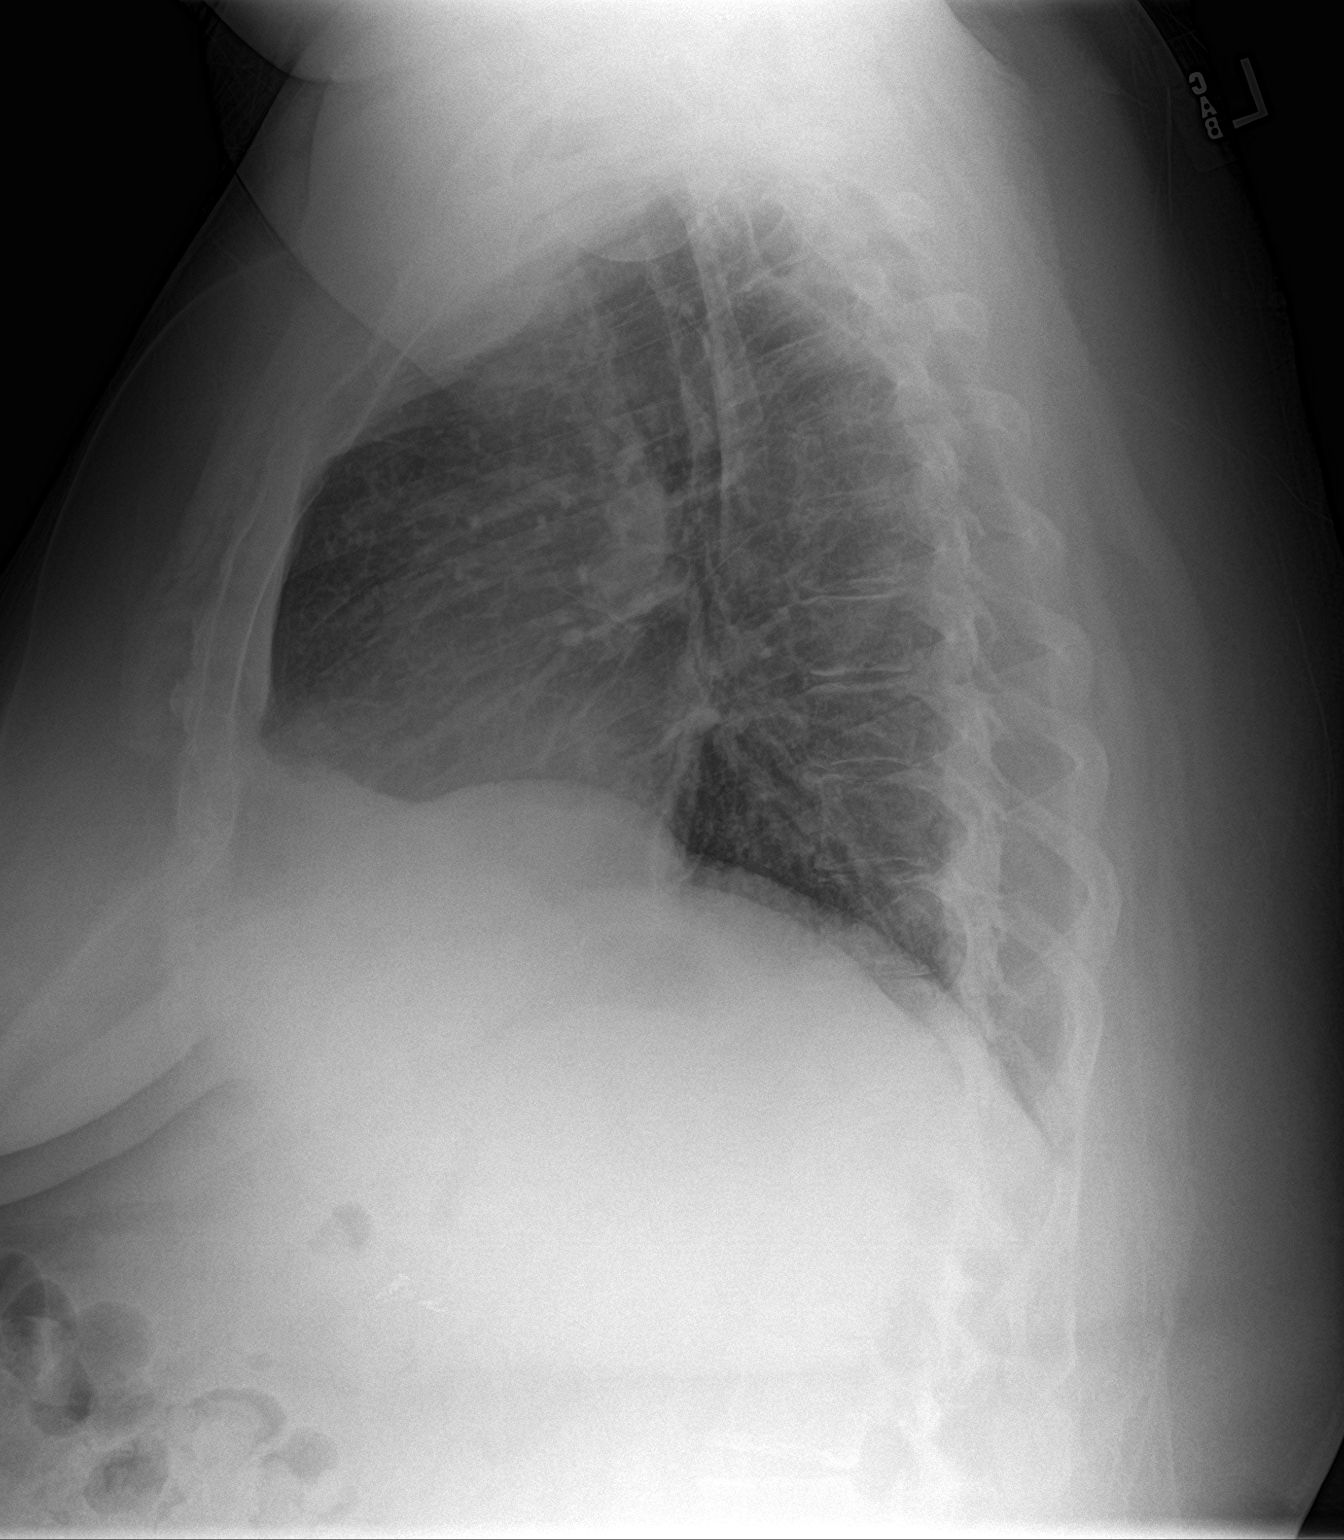

[2 of 2 positions shown; findings below may reference images not displayed]

FINDINGS: The heart size and mediastinal contours are within normal limits.
Both lungs are clear. The visualized skeletal structures are
unremarkable.
IMPRESSION: No active cardiopulmonary disease.

## 2018-07-27 IMAGING — DX DG ABDOMEN ACUTE W/ 1V CHEST
4 series · 4 of 4 positions shown · non-contrast
Comparison: Chest radiograph performed 02/26/2017, and CT of the
abdomen and pelvis from 02/18/2017

CLINICAL DATA: Acute onset of shortness of breath and generalized
abdominal pain.

EXAM:
DG ABDOMEN ACUTE W/ 1V CHEST

[chest pa]
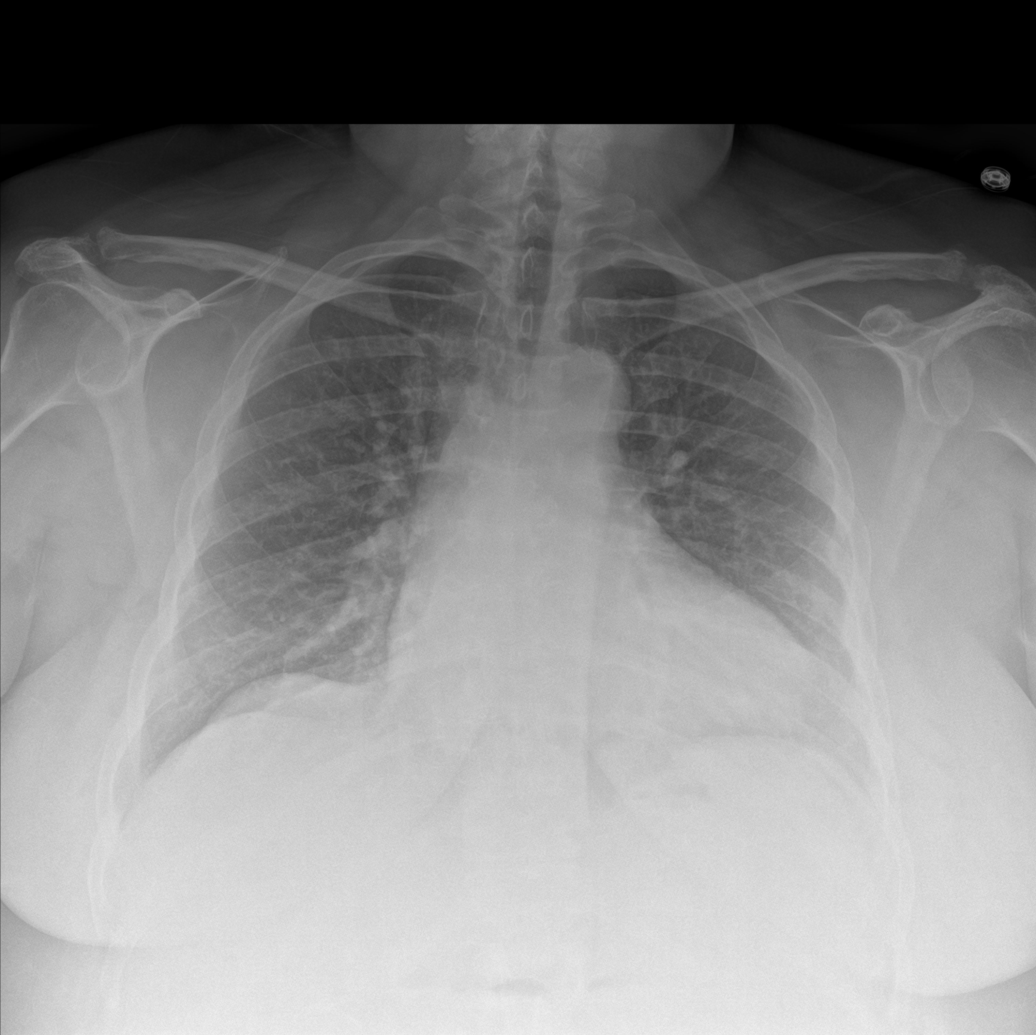

[abdomen erect]
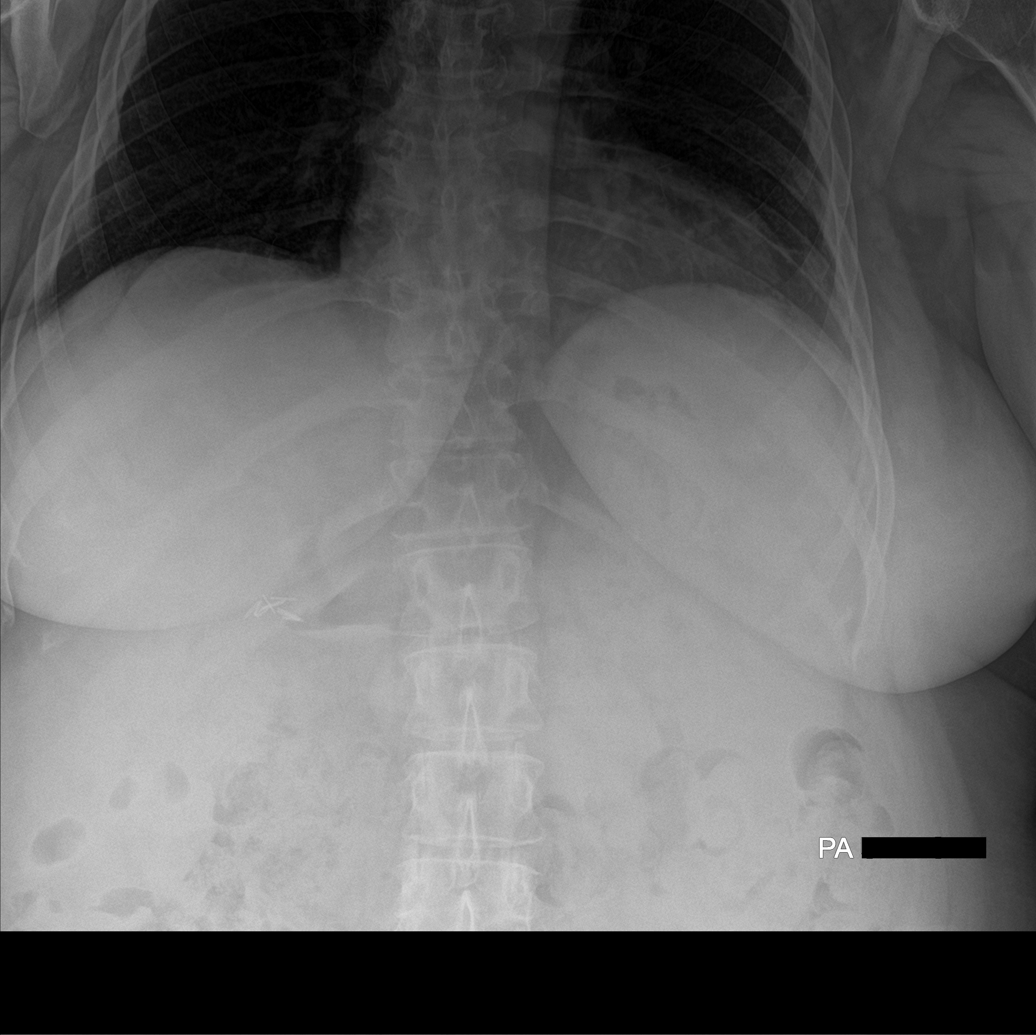

[abdomen supine (1 of 2)]
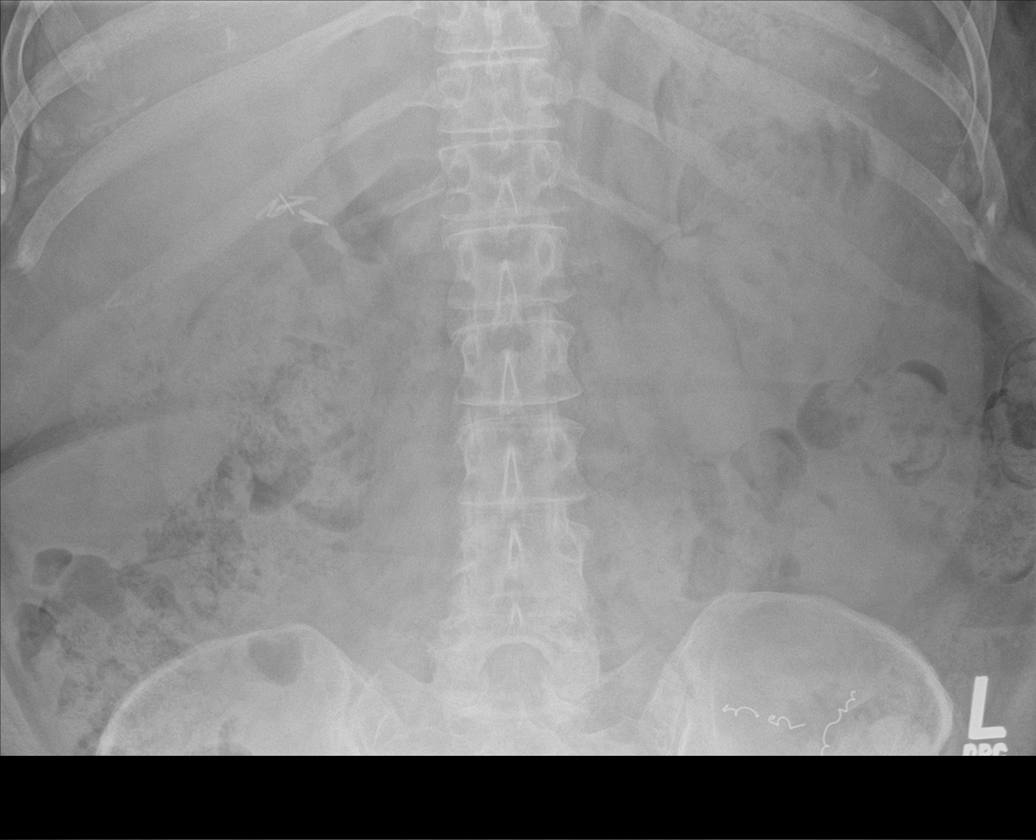

[abdomen supine (2 of 2)]
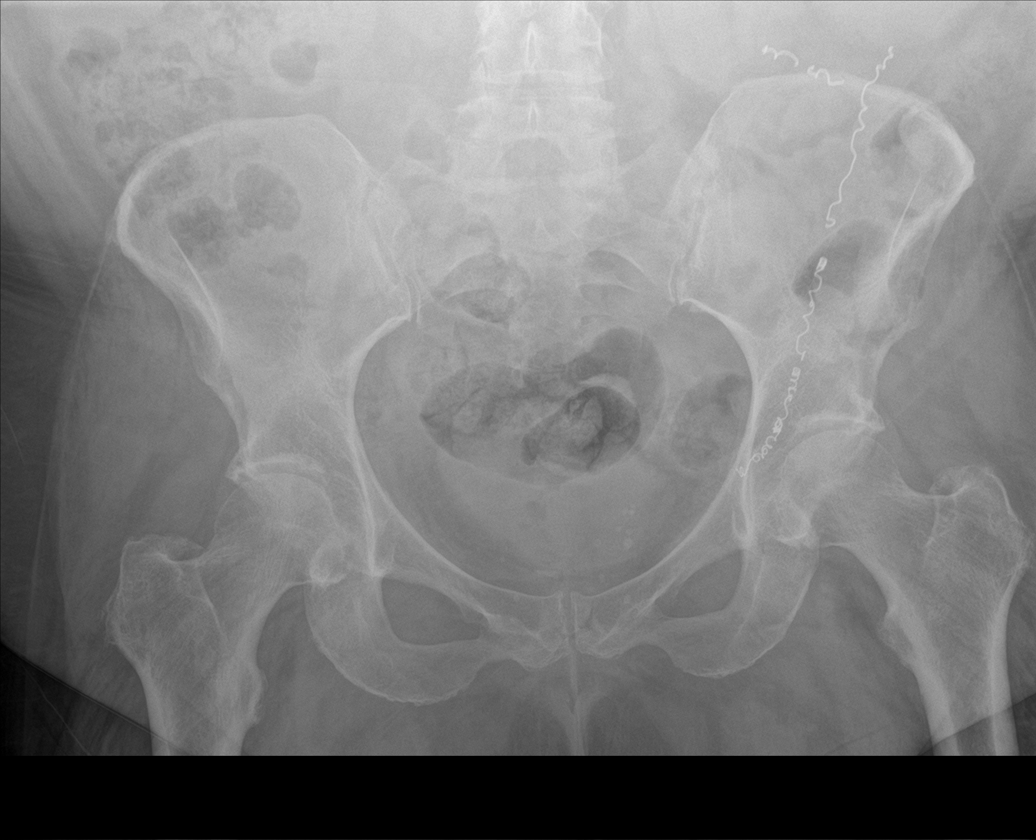

[4 of 4 positions shown; findings below may reference images not displayed]

FINDINGS: The lungs are well-aerated. Mild vascular congestion is noted. There
is no evidence of focal opacification, pleural effusion or
pneumothorax. The cardiomediastinal silhouette is borderline normal
in size.

The visualized bowel gas pattern is unremarkable. Scattered stool
and air are seen within the colon; there is no evidence of small
bowel dilatation to suggest obstruction. No free intra-abdominal air
is identified on the provided upright view. Clips are noted within
the right upper quadrant, reflecting prior cholecystectomy.

No acute osseous abnormalities are seen; the sacroiliac joints are
unremarkable in appearance. Coils are seen overlying the left
hemipelvis.
IMPRESSION: 1. Unremarkable bowel gas pattern; no free intra-abdominal air seen.
Moderate to large amount of stool noted in the colon.
2. Mild vascular congestion.  Lungs remain grossly clear.

## 2018-07-28 ENCOUNTER — Emergency Department: Payer: Medicaid - Out of State

## 2018-07-28 ENCOUNTER — Other Ambulatory Visit: Payer: Self-pay

## 2018-07-28 ENCOUNTER — Observation Stay
Admission: EM | Admit: 2018-07-28 | Discharge: 2018-07-30 | Disposition: A | Discharge status: Home / Self Care | Payer: Medicaid - Out of State | Attending: Internal Medicine | Admitting: Internal Medicine

## 2018-07-28 DIAGNOSIS — R188 Other ascites: Secondary | ICD-10-CM | POA: Diagnosis present

## 2018-07-28 DIAGNOSIS — J449 Chronic obstructive pulmonary disease, unspecified: Secondary | ICD-10-CM | POA: Diagnosis not present

## 2018-07-28 DIAGNOSIS — K219 Gastro-esophageal reflux disease without esophagitis: Secondary | ICD-10-CM | POA: Diagnosis present

## 2018-07-28 DIAGNOSIS — R140 Abdominal distension (gaseous): Secondary | ICD-10-CM

## 2018-07-28 DIAGNOSIS — F329 Major depressive disorder, single episode, unspecified: Secondary | ICD-10-CM | POA: Insufficient documentation

## 2018-07-28 DIAGNOSIS — E119 Type 2 diabetes mellitus without complications: Secondary | ICD-10-CM

## 2018-07-28 DIAGNOSIS — K7581 Nonalcoholic steatohepatitis (NASH): Secondary | ICD-10-CM | POA: Insufficient documentation

## 2018-07-28 DIAGNOSIS — Z6841 Body Mass Index (BMI) 40.0 and over, adult: Secondary | ICD-10-CM | POA: Insufficient documentation

## 2018-07-28 DIAGNOSIS — Z794 Long term (current) use of insulin: Secondary | ICD-10-CM | POA: Insufficient documentation

## 2018-07-28 DIAGNOSIS — Z79899 Other long term (current) drug therapy: Secondary | ICD-10-CM | POA: Insufficient documentation

## 2018-07-28 DIAGNOSIS — G4733 Obstructive sleep apnea (adult) (pediatric): Secondary | ICD-10-CM

## 2018-07-28 DIAGNOSIS — Z886 Allergy status to analgesic agent status: Secondary | ICD-10-CM | POA: Insufficient documentation

## 2018-07-28 DIAGNOSIS — K3189 Other diseases of stomach and duodenum: Secondary | ICD-10-CM | POA: Diagnosis not present

## 2018-07-28 DIAGNOSIS — E039 Hypothyroidism, unspecified: Secondary | ICD-10-CM | POA: Insufficient documentation

## 2018-07-28 DIAGNOSIS — I5032 Chronic diastolic (congestive) heart failure: Secondary | ICD-10-CM | POA: Diagnosis present

## 2018-07-28 DIAGNOSIS — N189 Chronic kidney disease, unspecified: Secondary | ICD-10-CM | POA: Insufficient documentation

## 2018-07-28 DIAGNOSIS — Z1159 Encounter for screening for other viral diseases: Secondary | ICD-10-CM | POA: Diagnosis not present

## 2018-07-28 DIAGNOSIS — G894 Chronic pain syndrome: Secondary | ICD-10-CM | POA: Diagnosis not present

## 2018-07-28 DIAGNOSIS — E1140 Type 2 diabetes mellitus with diabetic neuropathy, unspecified: Secondary | ICD-10-CM | POA: Diagnosis not present

## 2018-07-28 DIAGNOSIS — I130 Hypertensive heart and chronic kidney disease with heart failure and stage 1 through stage 4 chronic kidney disease, or unspecified chronic kidney disease: Secondary | ICD-10-CM | POA: Diagnosis not present

## 2018-07-28 DIAGNOSIS — F4322 Adjustment disorder with anxiety: Secondary | ICD-10-CM | POA: Insufficient documentation

## 2018-07-28 DIAGNOSIS — Z888 Allergy status to other drugs, medicaments and biological substances status: Secondary | ICD-10-CM | POA: Insufficient documentation

## 2018-07-28 DIAGNOSIS — Z0184 Encounter for antibody response examination: Secondary | ICD-10-CM | POA: Insufficient documentation

## 2018-07-28 DIAGNOSIS — R1012 Left upper quadrant pain: Secondary | ICD-10-CM | POA: Insufficient documentation

## 2018-07-28 DIAGNOSIS — R109 Unspecified abdominal pain: Secondary | ICD-10-CM

## 2018-07-28 DIAGNOSIS — Z8673 Personal history of transient ischemic attack (TIA), and cerebral infarction without residual deficits: Secondary | ICD-10-CM | POA: Insufficient documentation

## 2018-07-28 DIAGNOSIS — M79652 Pain in left thigh: Secondary | ICD-10-CM | POA: Diagnosis not present

## 2018-07-28 DIAGNOSIS — Z634 Disappearance and death of family member: Secondary | ICD-10-CM | POA: Diagnosis not present

## 2018-07-28 DIAGNOSIS — I10 Essential (primary) hypertension: Secondary | ICD-10-CM | POA: Diagnosis present

## 2018-07-28 DIAGNOSIS — Z885 Allergy status to narcotic agent status: Secondary | ICD-10-CM | POA: Insufficient documentation

## 2018-07-28 DIAGNOSIS — K7460 Unspecified cirrhosis of liver: Secondary | ICD-10-CM | POA: Diagnosis not present

## 2018-07-28 DIAGNOSIS — K766 Portal hypertension: Secondary | ICD-10-CM | POA: Diagnosis not present

## 2018-07-28 DIAGNOSIS — M79605 Pain in left leg: Secondary | ICD-10-CM | POA: Insufficient documentation

## 2018-07-28 DIAGNOSIS — E876 Hypokalemia: Secondary | ICD-10-CM | POA: Insufficient documentation

## 2018-07-28 DIAGNOSIS — Z9049 Acquired absence of other specified parts of digestive tract: Secondary | ICD-10-CM | POA: Insufficient documentation

## 2018-07-28 DIAGNOSIS — E1122 Type 2 diabetes mellitus with diabetic chronic kidney disease: Secondary | ICD-10-CM | POA: Insufficient documentation

## 2018-07-28 DIAGNOSIS — Z86718 Personal history of other venous thrombosis and embolism: Secondary | ICD-10-CM | POA: Insufficient documentation

## 2018-07-28 DIAGNOSIS — F339 Major depressive disorder, recurrent, unspecified: Secondary | ICD-10-CM | POA: Diagnosis not present

## 2018-07-28 DIAGNOSIS — Z8249 Family history of ischemic heart disease and other diseases of the circulatory system: Secondary | ICD-10-CM | POA: Insufficient documentation

## 2018-07-28 LAB — URINALYSIS, COMPLETE (UACMP) WITH MICROSCOPIC
Bilirubin Urine: NEGATIVE
Glucose, UA: NEGATIVE mg/dL
Hgb urine dipstick: NEGATIVE
Ketones, ur: NEGATIVE mg/dL
Leukocytes,Ua: NEGATIVE
Nitrite: NEGATIVE
Protein, ur: 30 mg/dL — AB
Specific Gravity, Urine: 1.029 (ref 1.005–1.030)
pH: 5 (ref 5.0–8.0)

## 2018-07-28 LAB — CBC
HCT: 38.4 % (ref 36.0–46.0)
Hemoglobin: 13.3 g/dL (ref 12.0–15.0)
MCH: 33.2 pg (ref 26.0–34.0)
MCHC: 34.6 g/dL (ref 30.0–36.0)
MCV: 95.8 fL (ref 80.0–100.0)
Platelets: 93 10*3/uL — ABNORMAL LOW (ref 150–400)
RBC: 4.01 MIL/uL (ref 3.87–5.11)
RDW: 13.4 % (ref 11.5–15.5)
WBC: 4.2 10*3/uL (ref 4.0–10.5)
nRBC: 0 % (ref 0.0–0.2)

## 2018-07-28 LAB — LIPASE, BLOOD: Lipase: 32 U/L (ref 11–51)

## 2018-07-28 LAB — COMPREHENSIVE METABOLIC PANEL
ALT: 20 U/L (ref 0–44)
AST: 30 U/L (ref 15–41)
Albumin: 3.1 g/dL — ABNORMAL LOW (ref 3.5–5.0)
Alkaline Phosphatase: 92 U/L (ref 38–126)
Anion gap: 6 (ref 5–15)
BUN: 13 mg/dL (ref 6–20)
CO2: 29 mmol/L (ref 22–32)
Calcium: 8.3 mg/dL — ABNORMAL LOW (ref 8.9–10.3)
Chloride: 101 mmol/L (ref 98–111)
Creatinine, Ser: 0.63 mg/dL (ref 0.44–1.00)
GFR calc Af Amer: >60 mL/min (ref >60)
GFR calc non Af Amer: >60 mL/min (ref >60)
Glucose, Bld: 150 mg/dL — ABNORMAL HIGH (ref 70–99)
Potassium: 4.1 mmol/L (ref 3.5–5.1)
Sodium: 136 mmol/L (ref 135–145)
Total Bilirubin: 1.3 mg/dL — ABNORMAL HIGH (ref 0.3–1.2)
Total Protein: 5.5 g/dL — ABNORMAL LOW (ref 6.5–8.1)

## 2018-07-28 LAB — GLUCOSE, CAPILLARY: Glucose-Capillary: 107 mg/dL — ABNORMAL HIGH (ref 70–99)

## 2018-07-28 LAB — PROTIME-INR
INR: 1.3 — ABNORMAL HIGH (ref 0.8–1.2)
Prothrombin Time: 16.1 seconds — ABNORMAL HIGH (ref 11.4–15.2)

## 2018-07-28 LAB — AMMONIA: Ammonia: 77 umol/L — ABNORMAL HIGH (ref 9–35)

## 2018-07-28 LAB — SARS CORONAVIRUS 2 BY RT PCR (HOSPITAL ORDER, PERFORMED IN ~~LOC~~ HOSPITAL LAB): SARS Coronavirus 2: NEGATIVE

## 2018-07-28 MED ORDER — ENOXAPARIN SODIUM 40 MG/0.4ML ~~LOC~~ SOLN
40.00 | SUBCUTANEOUS | Status: DC
Start: 2018-07-28 — End: 2018-07-28

## 2018-07-28 MED ORDER — OXYCODONE HCL 5 MG PO TABS
5.00 | ORAL_TABLET | ORAL | Status: DC
Start: ? — End: 2018-07-28

## 2018-07-28 MED ORDER — INSULIN ASPART 100 UNIT/ML ~~LOC~~ SOLN: 0.0000 [IU] | Freq: Four times a day (QID) | SUBCUTANEOUS | Status: DC

## 2018-07-28 MED ORDER — RIFAXIMIN 550 MG PO TABS
550.00 | ORAL_TABLET | ORAL | Status: DC
Start: 2018-07-28 — End: 2018-07-28

## 2018-07-28 MED ORDER — INSULIN REGULAR HUMAN 100 UNIT/ML IJ SOLN
0.00 | INTRAMUSCULAR | Status: DC
Start: 2018-07-27 — End: 2018-07-28

## 2018-07-28 MED ORDER — BUMETANIDE 1 MG PO TABS
3.00 | ORAL_TABLET | ORAL | Status: DC
Start: 2018-07-28 — End: 2018-07-28

## 2018-07-28 MED ORDER — ALPRAZOLAM 0.5 MG PO TABS
1.00 | ORAL_TABLET | ORAL | Status: DC
Start: ? — End: 2018-07-28

## 2018-07-28 MED ORDER — GABAPENTIN 300 MG PO CAPS
900.00 | ORAL_CAPSULE | ORAL | Status: DC
Start: 2018-07-27 — End: 2018-07-28

## 2018-07-28 MED ORDER — ACETAMINOPHEN 500 MG PO TABS
500.00 | ORAL_TABLET | ORAL | Status: DC
Start: ? — End: 2018-07-28

## 2018-07-28 MED ORDER — LACTULOSE 10 GM/15ML PO SOLN
40.0000 g | Freq: Four times a day (QID) | ORAL | Status: DC
  Administered 2018-07-29 (×2): 40 g via ORAL
  Administered 2018-07-29: 20 g via ORAL
  Administered 2018-07-30: 10:00:00 40 g via ORAL
  Filled 2018-07-28 (×6): qty 60

## 2018-07-28 MED ORDER — ONDANSETRON HCL 4 MG PO TABS: 4.0000 mg | ORAL_TABLET | Freq: Four times a day (QID) | ORAL | Status: DC | PRN

## 2018-07-28 MED ORDER — MORPHINE SULFATE (PF) 4 MG/ML IV SOLN
4.0000 mg | Freq: Once | INTRAVENOUS | Status: AC
  Administered 2018-07-28: 21:00:00 4 mg via INTRAVENOUS
  Filled 2018-07-28: qty 1

## 2018-07-28 MED ORDER — MORPHINE SULFATE (PF) 4 MG/ML IV SOLN
4.0000 mg | Freq: Once | INTRAVENOUS | Status: AC
  Administered 2018-07-28: 23:00:00 4 mg via INTRAVENOUS
  Filled 2018-07-28: qty 1

## 2018-07-28 MED ORDER — ENOXAPARIN SODIUM 40 MG/0.4ML ~~LOC~~ SOLN: 40.0000 mg | SUBCUTANEOUS | Status: DC

## 2018-07-28 MED ORDER — SPIRONOLACTONE 100 MG PO TABS
300.00 | ORAL_TABLET | ORAL | Status: DC
Start: 2018-07-28 — End: 2018-07-28

## 2018-07-28 MED ORDER — PANTOPRAZOLE SODIUM 40 MG PO TBEC
40.0000 mg | DELAYED_RELEASE_TABLET | Freq: Two times a day (BID) | ORAL | Status: DC
  Administered 2018-07-29 – 2018-07-30 (×4): 40 mg via ORAL
  Filled 2018-07-28 (×4): qty 1

## 2018-07-28 MED ORDER — AMITRIPTYLINE HCL 25 MG PO TABS
25.00 | ORAL_TABLET | ORAL | Status: DC
Start: ? — End: 2018-07-28

## 2018-07-28 MED ORDER — ONDANSETRON 4 MG PO TBDP
4.00 | ORAL_TABLET | ORAL | Status: DC
Start: ? — End: 2018-07-28

## 2018-07-28 MED ORDER — HYDROXYZINE HCL 25 MG PO TABS
50.00 | ORAL_TABLET | ORAL | Status: DC
Start: ? — End: 2018-07-28

## 2018-07-28 MED ORDER — PANTOPRAZOLE SODIUM 40 MG PO TBEC
40.00 | DELAYED_RELEASE_TABLET | ORAL | Status: DC
Start: 2018-07-28 — End: 2018-07-28

## 2018-07-28 MED ORDER — DEXTROSE 50 % IV SOLN
12.50 | INTRAVENOUS | Status: DC
Start: ? — End: 2018-07-28

## 2018-07-28 MED ORDER — ONDANSETRON HCL 4 MG/2ML IJ SOLN
4.0000 mg | Freq: Four times a day (QID) | INTRAMUSCULAR | Status: DC | PRN
  Administered 2018-07-29 (×2): 4 mg via INTRAVENOUS
  Filled 2018-07-28 (×2): qty 2

## 2018-07-28 MED ORDER — BUSPIRONE HCL 10 MG PO TABS
30.00 | ORAL_TABLET | ORAL | Status: DC
Start: 2018-07-28 — End: 2018-07-28

## 2018-07-28 MED ORDER — FLUTICASONE PROPIONATE 50 MCG/ACT NA SUSP
1.00 | NASAL | Status: DC
Start: ? — End: 2018-07-28

## 2018-07-28 MED ORDER — MORPHINE SULFATE (PF) 2 MG/ML IV SOLN
2.0000 mg | INTRAVENOUS | Status: DC | PRN
  Administered 2018-07-29: 2 mg via INTRAVENOUS
  Filled 2018-07-28: qty 1

## 2018-07-28 MED ORDER — DICLOFENAC SODIUM 1 % TD GEL
2.00 | TRANSDERMAL | Status: DC
Start: ? — End: 2018-07-28

## 2018-07-28 MED ORDER — NYSTATIN 100000 UNIT/GM EX POWD
1.00 | CUTANEOUS | Status: DC
Start: ? — End: 2018-07-28

## 2018-07-28 MED ORDER — LACTULOSE 20 G PO PACK
20.00 | PACK | ORAL | Status: DC
Start: 2018-07-27 — End: 2018-07-28

## 2018-07-28 MED ORDER — ONDANSETRON HCL 4 MG/2ML IJ SOLN
4.0000 mg | Freq: Once | INTRAMUSCULAR | Status: AC
  Administered 2018-07-28: 4 mg via INTRAVENOUS
  Filled 2018-07-28: qty 2

## 2018-07-28 MED ORDER — CITALOPRAM HYDROBROMIDE 20 MG PO TABS
40.00 | ORAL_TABLET | ORAL | Status: DC
Start: 2018-07-28 — End: 2018-07-28

## 2018-07-28 MED ORDER — SODIUM CHLORIDE 0.9% FLUSH
3.0000 mL | Freq: Once | INTRAVENOUS | Status: AC
  Administered 2018-07-29: 03:00:00 3 mL via INTRAVENOUS

## 2018-07-28 MED ORDER — RIZATRIPTAN BENZOATE 5 MG PO TABS
10.00 | ORAL_TABLET | ORAL | Status: DC
Start: ? — End: 2018-07-28

## 2018-07-28 MED ORDER — DICYCLOMINE HCL 10 MG PO CAPS
10.00 | ORAL_CAPSULE | ORAL | Status: DC
Start: ? — End: 2018-07-28

## 2018-07-28 NOTE — ED Provider Notes (Signed)
[facility] Emergency Department Provider Note ____________________________________________   First MD Initiated Contact with Patient 07/28/18 2006     (approximate)  I have reviewed the triage vital signs and the nursing notes.   HISTORY  Chief Complaint Abdominal Pain    HPI Lori Mckenzie is a 53 y.o. female with PMH as noted below including NASH cirrhosis and ascites who presents with abdominal pain, persistent course over at least the last several days, bilateral but somewhat worse on the left and across the middle of her abdomen.  It is associated with increased abdominal distention.  The patient had a large-volume paracentesis yesterday at [facility] and has had them as often as every 2 days for some time.  She recently moved from [place] and has not set up regular outpatient paracentesis.  She has an appointment with a hepatologist arranged for next month.  The patient states that the pain is about the same as it has been over the last week, and has not increased since her paracentesis yesterday.  She also reports pain to the left inner thigh.  She has a prior history of a DVT but is no longer on anticoagulation.  Past Medical History:  Diagnosis Date  . Abdominal abscess 08/25/2014  . Acid reflux 08/10/2010  . Acute cervical myofascial strain 02/09/2016  . Acute postoperative pain 08/09/2016  . Anxiety   . Ascites   . Asthma   . Back pain   . Bile leak, postoperative 07/17/2014  . Brittle bone disease   . Cancer ([place])    Uteriine  ca 31yr ago partial hysterectomy  . Cervical disc disease   . Chronic kidney disease   . Collagen vascular disease ([place]    RA  3-4 yrs ago  . COPD (chronic obstructive pulmonary disease) ([place]   . Diabetes mellitus without complication ([place]   . GERD (gastroesophageal reflux disease)   . Hypertension   . Hypothyroidism   . Left upper quadrant pain 01/09/2014  . Major depressive disorder with single episode 12/05/2011  . Major  depressive disorder, single episode 12/05/2011  . Migraines   . NASH (nonalcoholic steatohepatitis)   . Respiratory infection    2/17  . Shock ([place] 09/18/2014  . Sleep apnea   . Sleep apnea   . Syncope 11/16/2014  . Thyroid disease   . TIA (transient ischemic attack)     Patient Active Problem List   Diagnosis Date Noted  . UTI (urinary tract infection) 06/11/2017  . Neurogenic pain 02/13/2017  . Chest pain 12/20/2016  . Chronic feet pain (Secondary source of pain) (Bilateral) (R>L) 10/26/2016  . Acute hepatic encephalopathy 09/23/2016  . Low back pain due to L1-2 disc extrusion (caudal) (Left) 07/11/2016  . Abdominal pain 06/28/2016  . Constipation 06/20/2016  . Hyperglycemia 06/20/2016  . Nausea without vomiting 06/06/2016  . Chronic lower extremity pain (Bilateral) 05/19/2016  . Spinal stenosis, thoracic region (T10-11) 05/19/2016  . Diabetes mellitus, insulin dependent (IDDM), uncontrolled ([place] 04/12/2016  . Chronic sacroiliac joint pain (Bilateral) (L>R) 03/23/2016  . Chronic upper extremity pain (Left) 03/10/2016  . Chronic radicular cervical pain (L) 03/10/2016  . Osteoarthritis 02/24/2016  . Allodynia 02/09/2016  . Chronic pain syndrome 01/07/2016  . Opiate withdrawal ([place] 12/22/2015  . Elevated liver enzymes 09/28/2015  . Cirrhosis of liver with ascites ([place] 09/24/2015  . Cirrhosis ([place] 09/24/2015  . Altered mental status 07/24/2015  . Uncontrolled diabetes mellitus ([place] 07/24/2015  . Radicular pain of thoracic region 04/06/2015  .  Hepatic encephalopathy ([place]) 03/11/2015  . Elevated sedimentation rate 03/05/2015  . Elevated C-reactive protein (CRP) 03/05/2015  . Lumbar facet syndrome (Bilateral) (R>L) 03/05/2015  . Cervical spondylosis 03/05/2015  . Lumbar spondylosis 03/05/2015  . Encounter for chronic pain management 03/05/2015  . Chronic shoulder pain (Bilateral) (R>L) 03/05/2015  . Chronic carpal tunnel syndrome (Bilateral) 03/05/2015  . Chronic hip  pain (Bilateral) (L>R) 03/05/2015  . Chronic upper back pain (Bilateral) (L>R) 03/05/2015  . Osteoporosis, idiopathic 03/05/2015  . Abnormal MRI, lumbar spine (02/03/2015) 03/05/2015  . Thoracic radiculitis 02/05/2015  . Vitamin D deficiency 01/13/2015  . B12 deficiency 01/13/2015  . Folate deficiency 01/13/2015  . Subacute lumbar radiculopathy (left side) (S1 dermatome) 12/10/2014  . Drowsiness 11/16/2014  . Episode of syncope 11/16/2014  . Somnolence 11/16/2014  . Opiate use (75 MME/Day) 10/28/2014  . Long term prescription opiate use 10/28/2014  . Long term current use of opiate analgesic 10/28/2014  . Encounter for therapeutic drug level monitoring 10/28/2014  . Chronic epigastric abdominal pain 10/28/2014  . Chronic low back pain (Primary Source of Pain) (Bilateral) (R>L) 10/28/2014  . Chronic neck pain [facility] source of pain) (Bilateral) (R>L) 10/28/2014  . Ascites 09/05/2014  . NASH (nonalcoholic steatohepatitis) 09/05/2014  . Hypokalemia 09/05/2014  . Dysthymia 08/05/2014  . Other social stressor 08/05/2014  . Steatohepatitis 07/15/2014  . Type 2 diabetes mellitus ([place]) 07/12/2014  . COPD with acute exacerbation ([place]) 07/12/2014  . GERD (gastroesophageal reflux disease) 07/12/2014  . OSA on CPAP 07/12/2014  . Anxiety 07/12/2014  . 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 Lumbar and sacral osteoarthritis 03/26/2014  . Myofascial pain 03/26/2014  . Lumbar spinal stenosis 03/26/2014  . Lumbosacral spondylosis without myelopathy 03/26/2014  . 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 Lumbar central spinal stenosis (L1-2 and L4-5) 03/26/2014  . Spondylosis of lumbar region without myelopathy or radiculopathy 03/26/2014  . Breath shortness 01/09/2014  . Diastolic dysfunction with chronic heart failure ([place]) 08/06/2013  . Airway hyperreactivity 08/06/2013  . Essential (primary) hypertension 08/06/2013  . Asthma 08/06/2013  . Clinical depression 12/05/2011     Past Surgical History:  Procedure Laterality Date  . ABDOMINAL HYSTERECTOMY    . CHOLECYSTECTOMY N/A 07/15/2014   Procedure: LAPAROSCOPIC CHOLECYSTECTOMY with liver biopsy ;  Surgeon: Sherri Rad, MD;  Location: [facility] ORS;  Service: General;  Laterality: N/A;  . COLONOSCOPY WITH PROPOFOL N/A 06/23/2014   Procedure: COLONOSCOPY WITH PROPOFOL;  Surgeon: Lollie Sails, MD;  Location: [facility] ENDOSCOPY;  Service: Endoscopy;  Laterality: N/A;  . ERCP N/A 07/16/2014   Procedure: ENDOSCOPIC RETROGRADE CHOLANGIOPANCREATOGRAPHY (ERCP);  Surgeon: Clarene Essex, MD;  Location: [facility];  Service: Endoscopy;  Laterality: N/A;  . ERCP N/A 10/03/2014   Procedure: ENDOSCOPIC RETROGRADE CHOLANGIOPANCREATOGRAPHY (ERCP);  Surgeon: Hulen Luster, MD;  Location: [facility] ENDOSCOPY;  Service: Gastroenterology;  Laterality: N/A;  . ESOPHAGOGASTRODUODENOSCOPY N/A 06/23/2014   Procedure: ESOPHAGOGASTRODUODENOSCOPY (EGD);  Surgeon: Lollie Sails, MD;  Location: [facility] ENDOSCOPY;  Service: Endoscopy;  Laterality: N/A;  . ESOPHAGOGASTRODUODENOSCOPY N/A 06/30/2016   Procedure: ESOPHAGOGASTRODUODENOSCOPY (EGD);  Surgeon: Jonathon Bellows, MD;  Location: [facility] ENDOSCOPY;  Service: Endoscopy;  Laterality: N/A;  . ESOPHAGOGASTRODUODENOSCOPY (EGD) WITH PROPOFOL N/A 12/01/2015   Procedure: ESOPHAGOGASTRODUODENOSCOPY (EGD) WITH PROPOFOL;  Surgeon: Lollie Sails, MD;  Location: [facility] ENDOSCOPY;  Service: Endoscopy;  Laterality: N/A;  . TUBAL LIGATION    . WISDOM TOOTH EXTRACTION      Prior to Admission medications   Medication Sig Start Date End Date Taking? Authorizing  Provider  albuterol (PROVENTIL HFA;VENTOLIN HFA) 108 (90 Base) MCG/ACT inhaler Inhale 2 puffs into the lungs every 6 (six) hours as needed for wheezing or shortness of breath.    [provider]  ALPRAZolam Duanne Moron) 1 MG tablet Take 1 mg by mouth 3 (three) times daily.    [provider]  cholecalciferol (VITAMIN D) 1000 units tablet Take 2,000 Units by  mouth daily.    [provider]  ciprofloxacin (CIPRO) 500 MG tablet TAKE 1 TABLET BY MOUTH EVERY 12 HOURS FOR 7 DAYS 04/30/17   [provider]  citalopram (CELEXA) 40 MG tablet Take 40 mg by mouth daily.  07/24/15   [provider]  diclofenac sodium (VOLTAREN) 1 % GEL Apply 4 g topically every 8 (eight) hours as needed. Patient taking differently: Apply 4 g topically every 8 (eight) hours as needed (FOR PAIN).  12/06/16 06/11/17  Milinda Pointer, MD  fluticasone (FLONASE) 50 MCG/ACT nasal spray Place 1-2 sprays into both nostrils daily.     [provider]  Folate-B12-Intrinsic Factor (INTRINSI B12-FOLATE) 829-937-16 MCG-MCG-MG TABS Take 1 tablet by mouth daily.     [provider]  furosemide (LASIX) 40 MG tablet Take 1 tablet (40 mg total) by mouth 2 (two) times daily. Patient taking differently: Take 40 mg by mouth 2 (two) times daily as needed for fluid.  10/26/16   Nicholes Mango, MD  gabapentin (NEURONTIN) 300 MG capsule Take 600 mg by mouth 4 (four) times daily.     [provider]  ibuprofen (ADVIL,MOTRIN) 600 MG tablet Take 1 tablet (600 mg total) by mouth every 8 (eight) hours as needed. Patient not taking: Reported on 06/11/2017 04/29/17   Darel Hong, MD  insulin aspart (NOVOLOG) 100 UNIT/ML injection Inject 44 Units into the skin 3 (three) times daily with meals.    [provider]  insulin detemir (LEVEMIR) 100 UNIT/ML injection Inject 0.25 mLs (25 Units total) into the skin 2 (two) times daily. Patient taking differently: Inject 96 Units into the skin 2 (two) times daily.  10/26/16   Gouru, Illene Silver, MD  ipratropium-albuterol (DUONEB) 0.5-2.5 (3) MG/3ML SOLN Take 3 mLs by nebulization every 4 (four) hours as needed. For shortness of breath/wheezing    [provider]  lactulose (CHRONULAC) 10 GM/15ML solution Take 30 mLs (20 g total) by mouth 3 (three) times daily. Patient taking differently: Take 40 g by mouth 4  (four) times daily.  10/26/16   Gouru, Illene Silver, MD  lidocaine (LIDODERM) 5 % Place 1 patch onto the skin daily. Remove & Discard patch within 12 hours or as directed by MD Patient taking differently: Place 1 patch onto the skin daily as needed (FOR PAIN). Remove & Discard patch within 12 hours or as directed by MD 12/16/16   Gregor Hams, MD  nystatin (MYCOSTATIN/NYSTOP) powder Apply 1 g topically 3 (three) times daily as needed. For irritation 10/26/16   Gouru, Illene Silver, MD  Oxycodone HCl 10 MG TABS Take 10 mg by mouth 6 (six) times daily.    [provider]  pantoprazole (PROTONIX) 40 MG tablet Take 40 mg by mouth 2 (two) times daily before a meal.    [provider]  potassium chloride SA (K-DUR,KLOR-CON) 20 MEQ tablet Take 20 mEq by mouth daily as needed. For high blood pressure    [provider]  promethazine (PHENERGAN) 12.5 MG tablet Take 2 tablets (25 mg total) by mouth every 6 (six) hours as needed for nausea or vomiting. Patient  taking differently: Take 12.5-25 mg by mouth every 6 (six) hours as needed for nausea or vomiting.  09/25/16   Dustin Flock, MD  rifaximin (XIFAXAN) 550 MG TABS tablet Take 550 mg by mouth daily.     [provider]  rizatriptan (MAXALT) 10 MG tablet Take 10 mg by mouth as needed for migraine.     [provider]  spironolactone (ALDACTONE) 100 MG tablet Take 100 mg by mouth daily.     [provider]  vitamin B-12 (CYANOCOBALAMIN) 1000 MCG tablet Take 2,000 mcg by mouth daily.    [provider]  SUMAtriptan (IMITREX) 50 MG tablet Take 50 mg by mouth every 2 (two) hours as needed. For migraines  03/17/11  [provider]    Allergies Hydrocodone-acetaminophen, Nsaids, Tape, and Vicodin [hydrocodone-acetaminophen]  Family History  Problem Relation Age of Onset  . Lung cancer Mother   . Ulcers Father   . Emphysema Father   . Heart disease Sister   . Ulcers Sister   . Heart disease  Brother     Social History Social History   Tobacco Use  . Smoking status: Never Smoker  . Smokeless tobacco: Never Used  Substance Use Topics  . Alcohol use: No    Comment: occ  . Drug use: No    Review of Systems  Constitutional: No fever/chills. Eyes: No visual changes. ENT: No sore throat. Cardiovascular: Denies chest pain. Respiratory: Denies shortness of breath. Gastrointestinal: No vomiting or diarrhea.  Genitourinary: Negative for dysuria.  Musculoskeletal: Negative for back pain. Skin: Negative for rash. Neurological: Negative for headache.   ____________________________________________   PHYSICAL EXAM:  VITAL SIGNS: ED Triage Vitals  Enc Vitals Group     BP 07/28/18 1923 105/63     Pulse Rate 07/28/18 1923 (!) 109     Resp 07/28/18 1923 (!) 22     Temp 07/28/18 1923 98.6 F (37 C)     Temp src --      SpO2 07/28/18 1923 96 %     Weight 07/28/18 1924 221 lb (100.2 kg)     Height 07/28/18 1924 5' 2"  (1.575 m)     Head Circumference --      Peak Flow --      Pain Score 07/28/18 1924 8     Pain Loc --      Pain Edu? --      Excl. in [place]? --     Constitutional: Alert and oriented.  Relatively comfortable appearing and in no acute distress. Eyes: Conjunctivae are normal.  No scleral icterus. Head: Atraumatic. Nose: No congestion/rhinnorhea. Mouth/Throat: Mucous membranes are moist.   Neck: Normal range of motion.  Cardiovascular: Normal rate, regular rhythm. Good peripheral circulation. Respiratory: Normal respiratory effort.  No retractions.  Gastrointestinal: Soft with mild bilateral mid abdominal discomfort but no focal tenderness.  No distention.  Genitourinary: No flank tenderness. Musculoskeletal: No lower extremity edema.  Mild tenderness to the left proximal inner thigh with a few small palpable lymph nodes.  Extremities warm and well perfused.  Neurologic:  Normal speech and language. No gross focal neurologic deficits are appreciated.   Skin:  Skin is warm and dry. No rash noted. Psychiatric: Mood and affect are normal. Speech and behavior are normal.  ____________________________________________   LABS (all labs ordered are listed, but only abnormal results are displayed)  Labs Reviewed  COMPREHENSIVE METABOLIC PANEL - Abnormal; Notable for the following components:      Result Value  Glucose, Bld 150 (*)    Calcium 8.3 (*)    Total Protein 5.5 (*)    Albumin 3.1 (*)    Total Bilirubin 1.3 (*)    All other components within normal limits  CBC - Abnormal; Notable for the following components:   Platelets 93 (*)    All other components within normal limits  URINALYSIS, COMPLETE (UACMP) WITH MICROSCOPIC - Abnormal; Notable for the following components:   Color, Urine AMBER (*)    APPearance HAZY (*)    Protein, ur 30 (*)    Bacteria, UA RARE (*)    All other components within normal limits  PROTIME-INR - Abnormal; Notable for the following components:   Prothrombin Time 16.1 (*)    INR 1.3 (*)    All other components within normal limits  AMMONIA - Abnormal; Notable for the following components:   Ammonia 77 (*)    All other components within normal limits  SARS CORONAVIRUS 2 (HOSPITAL ORDER, [place] [facility])  LIPASE, BLOOD   ____________________________________________  EKG   ____________________________________________  RADIOLOGY    ____________________________________________   PROCEDURES  Procedure(s) performed: No  Procedures  Critical Care performed: No ____________________________________________   INITIAL IMPRESSION / ASSESSMENT AND PLAN / ED COURSE  Pertinent labs & imaging results that were available during my care of the patient were reviewed by me and considered in my medical decision making (see chart for details).  53 year old female with history of NASH cirrhosis and other PMH as noted above requiring frequent large-volume paracentesis presents  with persistent bilateral abdominal pain, recurrent distention, as well as with some left thigh pain.  I reviewed the past medical records in [place].  I confirmed that the patient was evaluated at the [facility] ED yesterday and had 4.5 L of fluid taken off during large-volume paracentesis.  She apparently went there after she had gone to the internal medicine clinic and the provider there was unable to successfully obtain fluid.  The patient had fluid analysis on 07/10/2018 during a prior paracentesis, and it showed no concerning findings.  CT 2 days ago showed ascites and some omental mesenteric stranding thought to be secondary to portal hypertension.  On exam the patient is overall well-appearing.  She is slightly tachycardic with otherwise normal vital signs.  She has no focal abdominal tenderness.  In terms of the abdominal pain and tenderness I suspect symptoms related to ongoing recurrent ascites and cirrhosis.  The patient has no peritoneal findings and there is no clinical evidence for SBP.  Given that she had a negative CT 2 days ago and states that the pain has not significantly worsened in the interim, there is no indication for repeat imaging today.  We will obtain a lab work-up to further evaluate.  I will plan to admit the patient for likely large-volume paracentesis by IR.  The left thigh pain appears to be due to mild lymphadenopathy, however given the patient's history of DVT I will obtain an ultrasound to rule out acute DVT.  There are no cutaneous findings or evidence of cellulitis or other acute skin or soft tissue pathology.  ----------------------------------------- 10:42 PM on 07/28/2018 -----------------------------------------  Ultrasound shows no evidence of DVT.  The lab work-up is reassuring.  I admitted the patient to the hospitalist Dr. Jannifer Franklin, with the plan for paracentesis by IR.  _________________________  [name] was evaluated in Emergency Department  on 07/28/2018 for the symptoms described in the history of present  illness. She was evaluated in the context of the global COVID-19 pandemic, which necessitated consideration that the patient might be at risk for infection with the SARS-CoV-2 virus that causes COVID-19. Institutional protocols and algorithms that pertain to the evaluation of patients at risk for COVID-19 are in a state of rapid change based on information released by regulatory bodies including the CDC and federal and state organizations. These policies and algorithms were followed during the patient's care in the ED.  ____________________________________________   FINAL CLINICAL IMPRESSION(S) / ED DIAGNOSES  Final diagnoses:  Other ascites      NEW MEDICATIONS STARTED DURING THIS VISIT:  New Prescriptions   No medications on file     Note:  This document was prepared using Dragon voice recognition software and may include unintentional dictation errors.    Arta Silence, MD 07/28/18 2243

## 2018-07-28 NOTE — ED Triage Notes (Signed)
Patient c/o abdominal pain and distension. Patient went to paracentesis yesterday. Patient c/o left leg cramping.

## 2018-07-28 NOTE — ED Notes (Signed)
Ice chips provided.

## 2018-07-28 NOTE — ED Notes (Signed)
ED TO INPATIENT HANDOFF REPORT  ED Nurse Name and Phone #: Khaniya Tenaglia [phone]   S Name/Age/Gender Lori Mckenzie 53 y.o. female Room/Bed: ED18A/ED18A  Code Status   Code Status: Prior  Home/SNF/Other Home Patient oriented to: self, place, time and situation Is this baseline? Yes   Triage Complete: Triage complete  Chief Complaint abdominal pain; cramps  Triage Note Patient c/o abdominal pain and distension. Patient went to paracentesis yesterday. Patient c/o left leg cramping.    Allergies Allergies  Allergen Reactions  . Hydrocodone-Acetaminophen   . Nsaids Other (See Comments)    Liver problems Liver problems   . Tape Swelling  . Vicodin [Hydrocodone-Acetaminophen] Itching and Rash    Level of Care/Admitting Diagnosis ED Disposition    ED Disposition Condition [facility] Area: [place] [[id number]]  Level of Care: Med-Surg [16]  Covid Evaluation: Asymptomatic Screening Protocol (No Symptoms)  Diagnosis: Cirrhosis of liver with ascites [facility]) [[id number]]  Admitting Physician: Lance Coon [[id number]]  Attending Physician: Lance Coon [[id number]]  PT Class (Do Not Modify): Observation [104]  PT Acc Code (Do Not Modify): Observation [10022]       B Medical/Surgery History Past Medical History:  Diagnosis Date  . Abdominal abscess 08/25/2014  . Acid reflux 08/10/2010  . Acute cervical myofascial strain 02/09/2016  . Acute postoperative pain 08/09/2016  . Anxiety   . Ascites   . Asthma   . Back pain   . Bile leak, postoperative 07/17/2014  . Brittle bone disease   . Cancer ([place])    Uteriine  ca 31yr ago partial hysterectomy  . Cervical disc disease   . Chronic kidney disease   . Collagen vascular disease ([place]    RA  3-4 yrs ago  . COPD [place] obstructive pulmonary disease) [place]   . Diabetes mellitus without complication ([place]   . GERD (gastroesophageal reflux disease)   . Hypertension   . Hypothyroidism   . Left upper  quadrant pain 01/09/2014  . Major depressive disorder with single episode 12/05/2011  . Major depressive disorder, single episode 12/05/2011  . Migraines   . NASH (nonalcoholic steatohepatitis)   . Respiratory infection    2/17  . Shock ([place] 09/18/2014  . Sleep apnea   . Sleep apnea   . Syncope 11/16/2014  . Thyroid disease   . TIA (transient ischemic attack)    Past Surgical History:  Procedure Laterality Date  . ABDOMINAL HYSTERECTOMY    . CHOLECYSTECTOMY N/A 07/15/2014   Procedure: LAPAROSCOPIC CHOLECYSTECTOMY with liver biopsy ;  Surgeon: MSherri Rad MD;  Location: [facility] ORS;  Service: General;  Laterality: N/A;  . COLONOSCOPY WITH PROPOFOL N/A 06/23/2014   Procedure: COLONOSCOPY WITH PROPOFOL;  Surgeon: MLollie Sails MD;  [facility];  Service: Endoscopy;  Laterality: N/A;  . ERCP N/A 07/16/2014   Procedure: ENDOSCOPIC RETROGRADE CHOLANGIOPANCREATOGRAPHY (ERCP);  Surgeon: MClarene Essex MD;  [facility];  Service: Endoscopy;  Laterality: N/A;  . ERCP N/A 10/03/2014   Procedure: ENDOSCOPIC RETROGRADE CHOLANGIOPANCREATOGRAPHY (ERCP);  Surgeon: PHulen Luster MD;  [facility];  Service: Gastroenterology;  Laterality: N/A;  . ESOPHAGOGASTRODUODENOSCOPY N/A 06/23/2014   Procedure: ESOPHAGOGASTRODUODENOSCOPY (EGD);  Surgeon: MLollie Sails MD;  [facility];  Service: Endoscopy;  Laterality: N/A;  . ESOPHAGOGASTRODUODENOSCOPY N/A 06/30/2016   Procedure: ESOPHAGOGASTRODUODENOSCOPY (EGD);  Surgeon: [name] MD;  [facility];  Service: Endoscopy;  Laterality: N/A;  . ESOPHAGOGASTRODUODENOSCOPY (EGD) WITH PROPOFOL N/A 12/01/2015   Procedure: ESOPHAGOGASTRODUODENOSCOPY (EGD)  WITH PROPOFOL;  Surgeon: Lollie Sails, MD;  Location[facility]COPY;  Service: Endoscopy;  Laterality: N/A;  . TUBAL LIGATION    . WISDOM TOOTH EXTRACTION       A IV Location/Drains/Wounds Patient Lines/Drains/Airways Status   Active Line/Drains/Airways     Name:   Placement date:   Placement time:   Site:   Days:   Peripheral IV 07/28/18 Right Antecubital   07/28/18    2000    Antecubital   less than 1          Intake/Output Last 24 hours No intake or output data in the 24 hours ending 07/28/18 2301  Labs/Imaging Results for orders placed or performed during the hospital encounter of 07/28/18 (from the past 48 hour(s))  Lipase, blood     Status: None   Collection Time: 07/28/18  7:36 PM  Result Value Ref Range   Lipase 32 11 - 51 U/L    Comment: Performed at [facility], [address] [place], [place] [address]  Comprehensive metabolic panel     Status: Abnormal   Collection Time: 07/28/18  7:36 PM  Result Value Ref Range   Sodium 136 135 - 145 mmol/L   Potassium 4.1 3.5 - 5.1 mmol/L   Chloride 101 98 - 111 mmol/L   CO2 29 22 - 32 mmol/L   Glucose, Bld 150 (H) 70 - 99 mg/dL   BUN 13 6 - 20 mg/dL   Creatinine, Ser 0.63 0.44 - 1.00 mg/dL   Calcium 8.3 (L) 8.9 - 10.3 mg/dL   Total Protein 5.5 (L) 6.5 - 8.1 g/dL   Albumin 3.1 (L) 3.5 - 5.0 g/dL   AST 30 15 - 41 U/L   ALT 20 0 - 44 U/L   Alkaline Phosphatase 92 38 - 126 U/L   Total Bilirubin 1.3 (H) 0.3 - 1.2 mg/dL   GFR calc non Af Amer >60 >60 mL/min   GFR calc Af Amer >60 >60 mL/min   Anion gap 6 5 - 15    Comment: Performed at [facility], [address] [place], [place] [address]  CBC     Status: Abnormal   Collection Time: 07/28/18  7:36 PM  Result Value Ref Range   WBC 4.2 4.0 - 10.5 K/uL   RBC 4.01 3.87 - 5.11 MIL/uL   Hemoglobin 13.3 12.0 - 15.0 g/dL   HCT 38.4 36.0 - 46.0 %   MCV 95.8 80.0 - 100.0 fL   MCH 33.2 26.0 - 34.0 pg   MCHC 34.6 30.0 - 36.0 g/dL   RDW 13.4 11.5 - 15.5 %   Platelets 93 (L) 150 - 400 K/uL    Comment: Immature Platelet Fraction may be clinically indicated, consider ordering this additional test [id number]    nRBC 0.0 0.0 - 0.2 %    Comment: Performed at [facility], [address]  [place], [place] [address]  Urinalysis, Complete w Microscopic     Status: Abnormal   Collection Time: 07/28/18  7:36 PM  Result Value Ref Range   Color, Urine AMBER (A) YELLOW    Comment: BIOCHEMICALS MAY BE AFFECTED BY COLOR   APPearance HAZY (A) CLEAR   Specific Gravity, Urine 1.029 1.005 - 1.030   pH 5.0 5.0 - 8.0   Glucose, UA NEGATIVE NEGATIVE mg/dL   Hgb urine dipstick NEGATIVE NEGATIVE   Bilirubin Urine NEGATIVE NEGATIVE   Ketones, ur NEGATIVE NEGATIVE mg/dL   Protein, ur 30 (A) NEGATIVE mg/dL  Nitrite NEGATIVE NEGATIVE   Leukocytes,Ua NEGATIVE NEGATIVE   RBC / HPF 0-5 0 - 5 RBC/hpf   WBC, UA 0-5 0 - 5 WBC/hpf   Bacteria, UA RARE (A) NONE SEEN   Squamous Epithelial / LPF 0-5 0 - 5   Mucus PRESENT     Comment: Performed at [facility], [address] [place], [place] [address]  Protime-INR     Status: Abnormal   Collection Time: 07/28/18  8:00 PM  Result Value Ref Range   Prothrombin Time 16.1 (H) 11.4 - 15.2 seconds   INR 1.3 (H) 0.8 - 1.2    Comment: (NOTE) INR goal varies based on device and disease states. Performed at [facility], [address] [place], [place] [address]   Ammonia     Status: Abnormal   Collection Time: 07/28/18  8:00 PM  Result Value Ref Range   Ammonia 77 (H) 9 - 35 umol/L    Comment: Performed at [facility], [address] [place],  [address]  SARS Coronavirus 2 (CEPHEID - Performed in [place] hospital lab), Hosp Order     Status: None   Collection Time: 07/28/18  9:28 PM   Specimen: Nasopharyngeal Swab  Result Value Ref Range   SARS Coronavirus 2 NEGATIVE NEGATIVE    Comment: (NOTE) If result is NEGATIVE SARS-CoV-2 target nucleic acids are NOT DETECTED. The SARS-CoV-2 RNA is generally detectable in upper and lower  respiratory specimens during the acute phase of infection. The lowest  concentration of SARS-CoV-2 viral copies this assay can detect is 250  copies / mL. A negative result does not  preclude SARS-CoV-2 infection  and should not be used as the sole basis for treatment or other  patient management decisions.  A negative result may occur with  improper specimen collection / handling, submission of specimen other  than nasopharyngeal swab, presence of viral mutation(s) within the  areas targeted by this assay, and inadequate number of viral copies  (<250 copies / mL). A negative result must be combined with clinical  observations, patient history, and epidemiological information. If result is POSITIVE SARS-CoV-2 target nucleic acids are DETECTED. The SARS-CoV-2 RNA is generally detectable in upper and lower  respiratory specimens dur ing the acute phase of infection.  Positive  results are indicative of active infection with SARS-CoV-2.  Clinical  correlation with patient history and other diagnostic information is  necessary to determine patient infection status.  Positive results do  not rule out bacterial infection or co-infection with other viruses. If result is PRESUMPTIVE POSTIVE SARS-CoV-2 nucleic acids MAY BE PRESENT.   A presumptive positive result was obtained on the submitted specimen  and confirmed on repeat testing.  While 2019 novel coronavirus  (SARS-CoV-2) nucleic acids may be present in the submitted sample  additional confirmatory testing may be necessary for epidemiological  and / or clinical management purposes  to differentiate between  SARS-CoV-2 and other Sarbecovirus currently known to infect humans.  If clinically indicated additional testing with an alternate test  methodology [id number]) is advised. The SARS-CoV-2 RNA is generally  detectable in upper and lower respiratory sp ecimens during the acute  phase of infection. The expected result is Negative. Fact Sheet for Patients:  StrictlyIdeas.no Fact Sheet for Healthcare Providers: BankingDealers.co.za This test is not yet approved or cleared by  the [place] FDA and has been authorized for detection and/or diagnosis of SARS-CoV-2 by FDA under an Emergency Use Authorization (EUA).  This EUA  will remain in effect (meaning this test can be used) for the duration of the COVID-19 declaration under Section 564(b)(1) of the Act, 21 U.S.C. section 360bbb-3(b)(1), unless the authorization is terminated or revoked sooner. Performed at [facility], [address] [place],  [address]    US Venous Img Lower Unilateral Left  Result Date: 07/28/2018 CLINICAL DATA:  Pain EXAM: LEFT LOWER EXTREMITY VENOUS DOPPLER ULTRASOUND TECHNIQUE: Gray-scale sonography with graded compression, as well as color Doppler and duplex ultrasound were performed to evaluate the lower extremity deep venous systems from the level of the common femoral vein and including the common femoral, femoral, profunda femoral, popliteal and calf veins including the posterior tibial, peroneal and gastrocnemius veins when visible. The superficial great saphenous vein was also interrogated. Spectral Doppler was utilized to evaluate flow at rest and with distal augmentation maneuvers in the common femoral, femoral and popliteal veins. COMPARISON:  None. FINDINGS: Contralateral Common Femoral Vein: Respiratory phasicity is normal and symmetric with the symptomatic side. No evidence of thrombus. Normal compressibility. Common Femoral Vein: No evidence of thrombus. Normal compressibility, respiratory phasicity and response to augmentation. Saphenofemoral Junction: No evidence of thrombus. Normal compressibility and flow on color Doppler imaging. Profunda Femoral Vein: No evidence of thrombus. Normal compressibility and flow on color Doppler imaging. Femoral Vein: No evidence of thrombus. Normal compressibility, respiratory phasicity and response to augmentation. Popliteal Vein: No evidence of thrombus. Normal compressibility, respiratory phasicity and response to augmentation.  Calf Veins: No evidence of thrombus. Normal compressibility and flow on color Doppler imaging. Superficial Great Saphenous Vein: No evidence of thrombus. Normal compressibility. Venous Reflux:  None. Other Findings:  None. IMPRESSION: No evidence of deep venous thrombosis. Electronically Signed   By: Constance Holster M.D.   On: 07/28/2018 21:27    Pending Labs [employer] (From admission, onward)    Start     Ordered   Signed and Held  HIV antibody (Routine Testing)  Once,   R     Signed and Held   Signed and Held  CBC  (enoxaparin (LOVENOX)    CrCl >/= 30 ml/min)  Once,   R    Comments: Baseline for enoxaparin therapy IF NOT ALREADY DRAWN.  Notify MD if PLT < 100 K.    Signed and Held   Signed and Held  Creatinine, serum  (enoxaparin (LOVENOX)    CrCl >/= 30 ml/min)  Once,   R    Comments: Baseline for enoxaparin therapy IF NOT ALREADY DRAWN.    Signed and Held   Signed and Held  Creatinine, serum  (enoxaparin (LOVENOX)    CrCl >/= 30 ml/min)  Weekly,   R    Comments: while on enoxaparin therapy    Signed and Held   Signed and Held  Basic metabolic panel  Tomorrow morning,   R     Signed and Held   Signed and Held  CBC  Tomorrow morning,   R     Signed and Held          Vitals/Pain Today's Vitals   07/28/18 1923 07/28/18 1924 07/28/18 2246 07/28/18 2300  BP: 105/63  103/66   Pulse: (!) 109   89  Resp: (!) 22     Temp: 98.6 F (37 C)     SpO2: 96%   97%  Weight:  100.2 kg    Height:  5' 2"  (1.575 m)    PainSc:  8       Isolation Precautions No  active isolations  Medications Medications  sodium chloride flush (NS) 0.9 % injection 3 mL (has no administration in time range)  morphine 4 MG/ML injection 4 mg (4 mg Intravenous Given 07/28/18 2035)  morphine 4 MG/ML injection 4 mg (4 mg Intravenous Given 07/28/18 2247)  ondansetron (ZOFRAN) injection 4 mg (4 mg Intravenous Given 07/28/18 2247)    Mobility walks Low fall risk   Focused  Assessments gi   R Recommendations: See Admitting Provider Note  Report given to:   Additional Notes:

## 2018-07-28 NOTE — ED Notes (Signed)
Assessment: pt states history of liver disease and frequent paracentesis. Pt states she is here for abd pain and swelling and possible DVT of legs. Pt is ambulatory slowly in room changing. Skin slightly yellow. Pt provided with incontinence brief to change as requested.

## 2018-07-28 NOTE — ED Notes (Signed)
FIRST NURSE NOTE:  Pt here to be checked for swelling, states she has liver disease and c/o abdominal swelling. No distress noted at registration desk. Pt ambulatory without difficulty.

## 2018-07-28 NOTE — ED Notes (Signed)
Pt requesting pain and nausea medication. md notified, order for zofran and morphine received.

## 2018-07-28 NOTE — H&P (Signed)
Lori Mckenzie NAME: Lori Mckenzie    MR#:  [id number]  DATE OF BIRTH:  09/13/1965  DATE OF ADMISSION:  07/28/2018  PRIMARY CARE PHYSICIAN: Ashkin, Neldon Labella, MD   REQUESTING/REFERRING PHYSICIAN: Cherylann Banas, MD  CHIEF COMPLAINT:   Chief Complaint  Patient presents with  . Abdominal Pain    HISTORY OF PRESENT ILLNESS:  Lori Mckenzie  is a 53 y.o. female who presents with chief complaint as above.  Patient presents to the ED with a complaint of abdominal fullness and pain.  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  She was living in [place], and was receiving regular paracentesis for volume control of her ascites.  She states that she was in process of coordinating TIPS procedure.  However, she had to move here after her son and husband passed away.  She needs paracentesis for her abdominal ascites, and requests to be connected with GI to continue evaluating for TIPS procedure.  Hospitalist were called for admission  PAST MEDICAL HISTORY:   Past Medical History:  Diagnosis Date  . Abdominal abscess 08/25/2014  . Acid reflux 08/10/2010  . Acute cervical myofascial strain 02/09/2016  . Acute postoperative pain 08/09/2016  . Anxiety   . Ascites   . Asthma   . Back pain   . Bile leak, postoperative 07/17/2014  . Brittle bone disease   . Cancer ([place])    Uteriine  ca 71yr ago partial hysterectomy  . Cervical disc disease   . Chronic kidney disease   . Collagen vascular disease ([place]    RA  3-4 yrs ago  . COPD (chronic obstructive pulmonary disease) ([place]   . Diabetes mellitus without complication ([place]   . GERD (gastroesophageal reflux disease)   . Hypertension   . Hypothyroidism   . Left upper quadrant pain 01/09/2014  . Major depressive disorder with single episode 12/05/2011  . Major depressive disorder, single episode 12/05/2011  . Migraines   . NASH (nonalcoholic steatohepatitis)   . Respiratory infection     2/17  . Shock ([place] 09/18/2014  . Sleep apnea   . Sleep apnea   . Syncope 11/16/2014  . Thyroid disease   . TIA (transient ischemic attack)      PAST SURGICAL HISTORY:   Past Surgical History:  Procedure Laterality Date  . ABDOMINAL HYSTERECTOMY    . CHOLECYSTECTOMY N/A 07/15/2014   Procedure: LAPAROSCOPIC CHOLECYSTECTOMY with liver biopsy ;  Surgeon: MSherri Rad MD;  Location: [facility] ORS;  Service: General;  Laterality: N/A;  . COLONOSCOPY WITH PROPOFOL N/A 06/23/2014   Procedure: COLONOSCOPY WITH PROPOFOL;  Surgeon: MLollie Sails MD;  [facility];  Service: Endoscopy;  Laterality: N/A;  . ERCP N/A 07/16/2014   Procedure: ENDOSCOPIC RETROGRADE CHOLANGIOPANCREATOGRAPHY (ERCP);  Surgeon: MClarene Essex MD;  [facility];  Service: Endoscopy;  Laterality: N/A;  . ERCP N/A 10/03/2014   Procedure: ENDOSCOPIC RETROGRADE CHOLANGIOPANCREATOGRAPHY (ERCP);  Surgeon: PHulen Luster MD;  [facility];  Service: Gastroenterology;  Laterality: N/A;  . ESOPHAGOGASTRODUODENOSCOPY N/A 06/23/2014   Procedure: ESOPHAGOGASTRODUODENOSCOPY (EGD);  Surgeon: MLollie Sails MD;  [facility];  Service: Endoscopy;  Laterality: N/A;  . ESOPHAGOGASTRODUODENOSCOPY N/A 06/30/2016   Procedure: ESOPHAGOGASTRODUODENOSCOPY (EGD);  Surgeon: [name] MD;  [facility];  Service: Endoscopy;  Laterality: N/A;  . ESOPHAGOGASTRODUODENOSCOPY (EGD) WITH PROPOFOL N/A 12/01/2015   Procedure: ESOPHAGOGASTRODUODENOSCOPY (EGD) WITH PROPOFOL;  Surgeon: MLollie Sails MD;  Location:  [facility];  Service: Endoscopy;  Laterality: N/A;  . TUBAL LIGATION    . WISDOM TOOTH EXTRACTION       SOCIAL HISTORY:   Social History   Tobacco Use  . Smoking status: Never Smoker  . Smokeless tobacco: Never Used  Substance Use Topics  . Alcohol use: No    Comment: occ     FAMILY HISTORY:   Family History  Problem Relation Age of Onset  . Lung cancer Mother   . Ulcers  Father   . Emphysema Father   . Heart disease Sister   . Ulcers Sister   . Heart disease Brother      DRUG ALLERGIES:   Allergies  Allergen Reactions  . Hydrocodone-Acetaminophen   . Nsaids Other (See Comments)    Liver problems Liver problems   . Tape Swelling  . Vicodin [Hydrocodone-Acetaminophen] Itching and Rash    MEDICATIONS AT HOME:   Prior to Admission medications   Medication Sig Start Date End Date Taking? Authorizing Provider  albuterol (PROVENTIL HFA;VENTOLIN HFA) 108 (90 Base) MCG/ACT inhaler Inhale 2 puffs into the lungs every 6 (six) hours as needed for wheezing or shortness of breath.    [provider]  ALPRAZolam Duanne Moron) 1 MG tablet Take 1 mg by mouth 3 (three) times daily.    [provider]  cholecalciferol (VITAMIN D) 1000 units tablet Take 2,000 Units by mouth daily.    [provider]  ciprofloxacin (CIPRO) 500 MG tablet TAKE 1 TABLET BY MOUTH EVERY 12 HOURS FOR 7 DAYS 04/30/17   [provider]  citalopram (CELEXA) 40 MG tablet Take 40 mg by mouth daily.  07/24/15   [provider]  diclofenac sodium (VOLTAREN) 1 % GEL Apply 4 g topically every 8 (eight) hours as needed. Patient taking differently: Apply 4 g topically every 8 (eight) hours as needed (FOR PAIN).  12/06/16 06/11/17  Milinda Pointer, MD  fluticasone (FLONASE) 50 MCG/ACT nasal spray Place 1-2 sprays into both nostrils daily.     [provider]  Folate-B12-Intrinsic Factor (INTRINSI B12-FOLATE) 158-309-40 MCG-MCG-MG TABS Take 1 tablet by mouth daily.     [provider]  furosemide (LASIX) 40 MG tablet Take 1 tablet (40 mg total) by mouth 2 (two) times daily. Patient taking differently: Take 40 mg by mouth 2 (two) times daily as needed for fluid.  10/26/16   Nicholes Mango, MD  gabapentin (NEURONTIN) 300 MG capsule Take 600 mg by mouth 4 (four) times daily.     [provider]  ibuprofen (ADVIL,MOTRIN) 600 MG tablet Take 1  tablet (600 mg total) by mouth every 8 (eight) hours as needed. Patient not taking: Reported on 06/11/2017 04/29/17   Darel Hong, MD  insulin aspart (NOVOLOG) 100 UNIT/ML injection Inject 44 Units into the skin 3 (three) times daily with meals.    [provider]  insulin detemir (LEVEMIR) 100 UNIT/ML injection Inject 0.25 mLs (25 Units total) into the skin 2 (two) times daily. Patient taking differently: Inject 96 Units into the skin 2 (two) times daily.  10/26/16   Gouru, Illene Silver, MD  ipratropium-albuterol (DUONEB) 0.5-2.5 (3) MG/3ML SOLN Take 3 mLs by nebulization every 4 (four) hours as needed. For shortness of breath/wheezing    [provider]  lactulose (CHRONULAC) 10 GM/15ML solution Take 30 mLs (20 g total) by mouth 3 (three) times daily. Patient taking differently: Take 40 g by mouth 4 (four) times daily.  10/26/16   Nicholes Mango, MD  lidocaine (  LIDODERM) 5 % Place 1 patch onto the skin daily. Remove & Discard patch within 12 hours or as directed by MD Patient taking differently: Place 1 patch onto the skin daily as needed (FOR PAIN). Remove & Discard patch within 12 hours or as directed by MD 12/16/16   Gregor Hams, MD  nystatin (MYCOSTATIN/NYSTOP) powder Apply 1 g topically 3 (three) times daily as needed. For irritation 10/26/16   Gouru, Illene Silver, MD  Oxycodone HCl 10 MG TABS Take 10 mg by mouth 6 (six) times daily.    [provider]  pantoprazole (PROTONIX) 40 MG tablet Take 40 mg by mouth 2 (two) times daily before a meal.    [provider]  potassium chloride SA (K-DUR,KLOR-CON) 20 MEQ tablet Take 20 mEq by mouth daily as needed. For high blood pressure    [provider]  promethazine (PHENERGAN) 12.5 MG tablet Take 2 tablets (25 mg total) by mouth every 6 (six) hours as needed for nausea or vomiting. Patient taking differently: Take 12.5-25 mg by mouth every 6 (six) hours as needed for nausea or vomiting.  09/25/16   Dustin Flock,  MD  rifaximin (XIFAXAN) 550 MG TABS tablet Take 550 mg by mouth daily.     [provider]  rizatriptan (MAXALT) 10 MG tablet Take 10 mg by mouth as needed for migraine.     [provider]  spironolactone (ALDACTONE) 100 MG tablet Take 100 mg by mouth daily.     [provider]  vitamin B-12 (CYANOCOBALAMIN) 1000 MCG tablet Take 2,000 mcg by mouth daily.    [provider]  SUMAtriptan (IMITREX) 50 MG tablet Take 50 mg by mouth every 2 (two) hours as needed. For migraines  03/17/11  [provider]    REVIEW OF SYSTEMS:  Review of Systems  Constitutional: Negative for chills, fever, malaise/fatigue and weight loss.  HENT: Negative for ear pain, hearing loss and tinnitus.   Eyes: Negative for blurred vision, double vision, pain and redness.  Respiratory: Negative for cough, hemoptysis and shortness of breath.   Cardiovascular: Negative for chest pain, palpitations, orthopnea and leg swelling.  Gastrointestinal: Positive for abdominal pain. Negative for constipation, diarrhea, nausea and vomiting.  Genitourinary: Negative for dysuria, frequency and hematuria.  Musculoskeletal: Negative for back pain, joint pain and neck pain.  Skin:       No acne, rash, or lesions  Neurological: Negative for dizziness, tremors, focal weakness and weakness.  Endo/Heme/Allergies: Negative for polydipsia. Does not bruise/bleed easily.  Psychiatric/Behavioral: Negative for depression. The patient is not nervous/anxious and does not have insomnia.      VITAL SIGNS:   Vitals:   07/28/18 1923 07/28/18 1924  BP: 105/63   Pulse: (!) 109   Resp: (!) 22   Temp: 98.6 F (37 C)   SpO2: 96%   Weight:  100.2 kg  Height:  5' 2"  (1.575 m)   Wt Readings from Last 3 Encounters:  07/28/18 100.2 kg  07/10/17 120.7 kg  06/16/17 120.7 kg    PHYSICAL EXAMINATION:  Physical Exam  Vitals reviewed. Constitutional: She is oriented to person, place, and time. She appears  well-developed and well-nourished. No distress.  HENT:  Head: Normocephalic and atraumatic.  Mouth/Throat: Oropharynx is clear and moist.  Eyes: Pupils are equal, round, and reactive to light. Conjunctivae and EOM are normal. No scleral icterus.  Neck: Normal range of motion. Neck supple. No JVD present. No thyromegaly present.  Cardiovascular: Normal rate, regular rhythm and  intact distal pulses. Exam reveals no gallop and no friction rub.  No murmur heard. Respiratory: Effort normal and breath sounds normal. No respiratory distress. She has no wheezes. She has no rales.  GI: Soft. Bowel sounds are normal. She exhibits distension. There is abdominal tenderness.  Musculoskeletal: Normal range of motion.        General: No edema.     Comments: No arthritis, no gout  Lymphadenopathy:    She has no cervical adenopathy.  Neurological: She is alert and oriented to person, place, and time. No cranial nerve deficit.  No dysarthria, no aphasia  Skin: Skin is warm and dry. No rash noted. No erythema.  Psychiatric: She has a normal mood and affect. Her behavior is normal. Judgment and thought content normal.    LABORATORY PANEL:   CBC Recent Labs  Lab 07/28/18 1936  WBC 4.2  HGB 13.3  HCT 38.4  PLT 93*   ------------------------------------------------------------------------------------------------------------------  Chemistries  Recent Labs  Lab 07/28/18 1936  NA 136  K 4.1  CL 101  CO2 29  GLUCOSE 150*  BUN 13  CREATININE 0.63  CALCIUM 8.3*  AST 30  ALT 20  ALKPHOS 92  BILITOT 1.3*   ------------------------------------------------------------------------------------------------------------------  Cardiac Enzymes No results for input(s): TROPONINI in the last 168 hours. ------------------------------------------------------------------------------------------------------------------  RADIOLOGY:  US Venous Img Lower Unilateral Left  Result Date: 07/28/2018 CLINICAL  DATA:  Pain EXAM: LEFT LOWER EXTREMITY VENOUS DOPPLER ULTRASOUND TECHNIQUE: Gray-scale sonography with graded compression, as well as color Doppler and duplex ultrasound were performed to evaluate the lower extremity deep venous systems from the level of the common femoral vein and including the common femoral, femoral, profunda femoral, popliteal and calf veins including the posterior tibial, peroneal and gastrocnemius veins when visible. The superficial great saphenous vein was also interrogated. Spectral Doppler was utilized to evaluate flow at rest and with distal augmentation maneuvers in the common femoral, femoral and popliteal veins. COMPARISON:  None. FINDINGS: Contralateral Common Femoral Vein: Respiratory phasicity is normal and symmetric with the symptomatic side. No evidence of thrombus. Normal compressibility. Common Femoral Vein: No evidence of thrombus. Normal compressibility, respiratory phasicity and response to augmentation. Saphenofemoral Junction: No evidence of thrombus. Normal compressibility and flow on color Doppler imaging. Profunda Femoral Vein: No evidence of thrombus. Normal compressibility and flow on color Doppler imaging. Femoral Vein: No evidence of thrombus. Normal compressibility, respiratory phasicity and response to augmentation. Popliteal Vein: No evidence of thrombus. Normal compressibility, respiratory phasicity and response to augmentation. Calf Veins: No evidence of thrombus. Normal compressibility and flow on color Doppler imaging. Superficial Great Saphenous Vein: No evidence of thrombus. Normal compressibility. Venous Reflux:  None. Other Findings:  None. IMPRESSION: No evidence of deep venous thrombosis. Electronically Signed   By: Constance Holster M.D.   On: 07/28/2018 21:27    EKG:   Orders placed or performed during the hospital encounter of 07/28/18  . EKG 12-Lead  . EKG 12-Lead    IMPRESSION AND PLAN:  Principal Problem:   Cirrhosis of liver with  ascites ([place]) -admit to general floor, plan for ultrasound-guided paracentesis Active Problems:   Type 2 diabetes mellitus ([facility]) -sliding scale insulin coverage   OSA on CPAP -CPAP nightly   Diastolic dysfunction with chronic heart failure ([place]) -continue home meds   Essential (primary) hypertension -home dose antihypertensives   GERD (gastroesophageal reflux disease) -home dose PPI  Chart review performed and case discussed with ED provider. Labs, imaging and/or ECG reviewed by provider and discussed  with patient/family. Management plans discussed with the patient and/or family.  COVID-19 status: Pending  DVT PROPHYLAXIS: SubQ lovenox   GI PROPHYLAXIS:  PPI   ADMISSION STATUS: Observation  CODE STATUS: Full Code Status History    Date Active Date Inactive Code Status Order ID Comments User Context   06/11/2017 2157 06/13/2017 1914 DNR [id number]  Oswald Hillock, MD ED   12/20/2016 2227 12/22/2016 1808 Full Code [id number]  Lance Coon, MD Inpatient   10/21/2016 1929 10/26/2016 1824 Full Code [id number]  Epifanio Lesches, MD ED   09/23/2016 1915 09/25/2016 1746 Full Code [id number]  Demetrios Loll, MD Inpatient   06/28/2016 2122 06/30/2016 1909 Full Code [id number]  Henreitta Leber, MD Inpatient   03/12/2015 0009 03/13/2015 1723 Full Code [id number]  Gladstone Lighter, MD Inpatient   10/04/2014 1259 10/06/2014 2020 Full Code [id number]  Baxter Hire, MD Inpatient   09/05/2014 0229 09/08/2014 1810 Full Code [id number]  Lance Coon, MD Inpatient   08/25/2014 1823 08/30/2014 1348 Full Code [id number]  Florene Glen, MD ED   08/03/2014 1712 08/08/2014 1516 Full Code [id number]  Marlyce Huge, MD ED   07/24/2014 1845 07/30/2014 1731 Full Code [id number]  Florene Glen, MD Inpatient   07/23/2014 1142 07/24/2014 0327 Full Code [id number]  Sabino Dick, MD HOV   07/12/2014 2325 07/16/2014 1630 Full Code [id number]  Lance Coon, MD Inpatient   Advance Care Planning Activity    Questions for Most  Recent Historical Code Status (Order [id number])    Question Answer Comment   In the event of cardiac or respiratory ARREST Do not call a "code blue"    In the event of cardiac or respiratory ARREST Do not perform Intubation, CPR, defibrillation or ACLS    In the event of cardiac or respiratory ARREST Use medication by any route, position, wound care, and other measures to relive pain and suffering. May use oxygen, suction and manual treatment of airway obstruction as needed for comfort.       TOTAL TIME TAKING CARE OF THIS PATIENT: 40 minutes.   This patient was evaluated in the context of the global COVID-19 pandemic, which necessitated consideration that the patient might be at risk for infection with the SARS-CoV-2 virus that causes COVID-19. Institutional protocols and algorithms that pertain to the evaluation of patients at risk for COVID-19 are in a state of rapid change based on information released by regulatory bodies including the CDC and federal and state organizations. These policies and algorithms were followed to the best of this provider's knowledge to date during the patient's care at this facility.  Ethlyn Daniels 07/28/2018, 10:38 PM  [facility]  Office  [phone]  CC: Primary care physician; Ardine Eng, MD  Note:  This document was prepared using Dragon voice recognition software and may include unintentional dictation errors.

## 2018-07-29 DIAGNOSIS — F339 Major depressive disorder, recurrent, unspecified: Secondary | ICD-10-CM

## 2018-07-29 DIAGNOSIS — Z634 Disappearance and death of family member: Secondary | ICD-10-CM

## 2018-07-29 LAB — GLUCOSE, CAPILLARY
Glucose-Capillary: 139 mg/dL — ABNORMAL HIGH (ref 70–99)
Glucose-Capillary: 121 mg/dL — ABNORMAL HIGH (ref 70–99)
Glucose-Capillary: 108 mg/dL — ABNORMAL HIGH (ref 70–99)
Glucose-Capillary: 120 mg/dL — ABNORMAL HIGH (ref 70–99)
Glucose-Capillary: 138 mg/dL — ABNORMAL HIGH (ref 70–99)

## 2018-07-29 LAB — CBC
HCT: 36.7 % (ref 36.0–46.0)
Hemoglobin: 12.8 g/dL (ref 12.0–15.0)
MCH: 33.3 pg (ref 26.0–34.0)
MCHC: 34.9 g/dL (ref 30.0–36.0)
MCV: 95.6 fL (ref 80.0–100.0)
Platelets: 83 10*3/uL — ABNORMAL LOW (ref 150–400)
RBC: 3.84 MIL/uL — ABNORMAL LOW (ref 3.87–5.11)
RDW: 13.4 % (ref 11.5–15.5)
WBC: 2.9 10*3/uL — ABNORMAL LOW (ref 4.0–10.5)
nRBC: 0 % (ref 0.0–0.2)

## 2018-07-29 LAB — TROPONIN I (HIGH SENSITIVITY): Troponin I (High Sensitivity): 5 ng/L (ref <18)

## 2018-07-29 LAB — BASIC METABOLIC PANEL
Anion gap: 6 (ref 5–15)
BUN: 11 mg/dL (ref 6–20)
CO2: 27 mmol/L (ref 22–32)
Calcium: 8.3 mg/dL — ABNORMAL LOW (ref 8.9–10.3)
Chloride: 103 mmol/L (ref 98–111)
Creatinine, Ser: 0.53 mg/dL (ref 0.44–1.00)
GFR calc Af Amer: >60 mL/min (ref >60)
GFR calc non Af Amer: >60 mL/min (ref >60)
Glucose, Bld: 138 mg/dL — ABNORMAL HIGH (ref 70–99)
Potassium: 3.2 mmol/L — ABNORMAL LOW (ref 3.5–5.1)
Sodium: 136 mmol/L (ref 135–145)

## 2018-07-29 LAB — MRSA PCR SCREENING: MRSA by PCR: POSITIVE — AB

## 2018-07-29 MED ORDER — POTASSIUM CHLORIDE 10 MEQ/100ML IV SOLN
10.0000 meq | INTRAVENOUS | Status: AC
  Administered 2018-07-29 (×4): 10 meq via INTRAVENOUS
  Filled 2018-07-29 (×3): qty 100

## 2018-07-29 MED ORDER — ALPRAZOLAM 0.5 MG PO TABS
0.5000 mg | ORAL_TABLET | Freq: Once | ORAL | Status: AC
  Administered 2018-07-29: 03:00:00 0.5 mg via ORAL
  Filled 2018-07-29: qty 1

## 2018-07-29 MED ORDER — SODIUM CHLORIDE 0.9% FLUSH: 3.0000 mL | INTRAVENOUS | Status: DC | PRN

## 2018-07-29 MED ORDER — NADOLOL 20 MG PO TABS
20.0000 mg | ORAL_TABLET | Freq: Every day | ORAL | Status: DC
  Administered 2018-07-29: 20 mg via ORAL
  Filled 2018-07-29 (×2): qty 1

## 2018-07-29 MED ORDER — BUMETANIDE 1 MG PO TABS
3.0000 mg | ORAL_TABLET | Freq: Two times a day (BID) | ORAL | Status: DC
  Administered 2018-07-29 – 2018-07-30 (×2): 3 mg via ORAL
  Filled 2018-07-29 (×4): qty 3

## 2018-07-29 MED ORDER — ALBUTEROL SULFATE (2.5 MG/3ML) 0.083% IN NEBU: 2.5000 mg | INHALATION_SOLUTION | Freq: Four times a day (QID) | RESPIRATORY_TRACT | Status: DC | PRN

## 2018-07-29 MED ORDER — INTRINSI B12-FOLATE 800-500-20 MCG-MCG-MG PO TABS: 1.0000 | ORAL_TABLET | Freq: Every day | ORAL | Status: DC

## 2018-07-29 MED ORDER — ALPRAZOLAM 0.5 MG PO TABS
1.0000 mg | ORAL_TABLET | Freq: Three times a day (TID) | ORAL | Status: DC
  Administered 2018-07-29 – 2018-07-30 (×4): 1 mg via ORAL
  Filled 2018-07-29 (×4): qty 2

## 2018-07-29 MED ORDER — SPIRONOLACTONE 100 MG PO TABS
100.0000 mg | ORAL_TABLET | Freq: Every day | ORAL | Status: DC
  Administered 2018-07-29 – 2018-07-30 (×2): 100 mg via ORAL
  Filled 2018-07-29 (×2): qty 4
  Filled 2018-07-29 (×2): qty 1

## 2018-07-29 MED ORDER — INSULIN ASPART 100 UNIT/ML ~~LOC~~ SOLN
0.0000 [IU] | Freq: Three times a day (TID) | SUBCUTANEOUS | Status: DC
  Administered 2018-07-30: 08:00:00 1 [IU] via SUBCUTANEOUS
  Filled 2018-07-29: qty 1

## 2018-07-29 MED ORDER — MORPHINE SULFATE (PF) 2 MG/ML IV SOLN
2.0000 mg | INTRAVENOUS | Status: DC | PRN
  Administered 2018-07-29 – 2018-07-30 (×5): 2 mg via INTRAVENOUS
  Filled 2018-07-29 (×5): qty 1

## 2018-07-29 MED ORDER — RIFAXIMIN 550 MG PO TABS
550.0000 mg | ORAL_TABLET | Freq: Every day | ORAL | Status: DC
  Administered 2018-07-29 – 2018-07-30 (×2): 550 mg via ORAL
  Filled 2018-07-29 (×2): qty 1

## 2018-07-29 MED ORDER — SODIUM CHLORIDE 0.9 % IV SOLN
INTRAVENOUS | Status: DC | PRN
  Administered 2018-07-29: 12:00:00 500 mL via INTRAVENOUS
  Administered 2018-07-30: 10:00:00 10 mL via INTRAVENOUS

## 2018-07-29 MED ORDER — VITAMIN B-12 1000 MCG PO TABS
2000.0000 ug | ORAL_TABLET | Freq: Every day | ORAL | Status: DC
  Administered 2018-07-29 – 2018-07-30 (×2): 2000 ug via ORAL
  Filled 2018-07-29 (×2): qty 2

## 2018-07-29 MED ORDER — DICLOFENAC SODIUM 1 % TD GEL
2.0000 g | Freq: Four times a day (QID) | TRANSDERMAL | Status: DC | PRN
  Filled 2018-07-29: qty 100

## 2018-07-29 MED ORDER — GABAPENTIN 300 MG PO CAPS
600.0000 mg | ORAL_CAPSULE | Freq: Four times a day (QID) | ORAL | Status: DC
  Administered 2018-07-29 – 2018-07-30 (×5): 600 mg via ORAL
  Filled 2018-07-29 (×5): qty 2

## 2018-07-29 MED ORDER — ALBUTEROL SULFATE HFA 108 (90 BASE) MCG/ACT IN AERS: 2.0000 | INHALATION_SPRAY | Freq: Four times a day (QID) | RESPIRATORY_TRACT | Status: DC | PRN

## 2018-07-29 MED ORDER — CITALOPRAM HYDROBROMIDE 20 MG PO TABS
40.0000 mg | ORAL_TABLET | Freq: Every day | ORAL | Status: DC
  Administered 2018-07-29 – 2018-07-30 (×2): 40 mg via ORAL
  Filled 2018-07-29 (×2): qty 2

## 2018-07-29 MED ORDER — OXYCODONE HCL 5 MG PO TABS
10.0000 mg | ORAL_TABLET | Freq: Every day | ORAL | Status: DC
  Administered 2018-07-29 – 2018-07-30 (×6): 10 mg via ORAL
  Filled 2018-07-29 (×6): qty 2

## 2018-07-29 MED ORDER — VITAMIN B-12 1000 MCG PO TABS
500.0000 ug | ORAL_TABLET | Freq: Every day | ORAL | Status: DC
  Filled 2018-07-29: qty 1

## 2018-07-29 MED ORDER — SODIUM CHLORIDE 0.9 % IV SOLN
1.0000 g | INTRAVENOUS | Status: DC
  Administered 2018-07-29 – 2018-07-30 (×2): 1 g via INTRAVENOUS
  Filled 2018-07-29 (×2): qty 1

## 2018-07-29 MED ORDER — FOLIC ACID 1 MG PO TABS
1.0000 mg | ORAL_TABLET | Freq: Every day | ORAL | Status: DC
  Administered 2018-07-29 – 2018-07-30 (×2): 1 mg via ORAL
  Filled 2018-07-29 (×2): qty 1

## 2018-07-29 MED ORDER — FUROSEMIDE 40 MG PO TABS
40.0000 mg | ORAL_TABLET | Freq: Two times a day (BID) | ORAL | Status: DC
  Administered 2018-07-29: 40 mg via ORAL
  Filled 2018-07-29: qty 1

## 2018-07-29 MED ORDER — SODIUM CHLORIDE 0.9% FLUSH
3.0000 mL | Freq: Two times a day (BID) | INTRAVENOUS | Status: DC
  Administered 2018-07-29 – 2018-07-30 (×3): 3 mL via INTRAVENOUS

## 2018-07-29 MED ORDER — FLUTICASONE PROPIONATE 50 MCG/ACT NA SUSP
1.0000 | Freq: Every day | NASAL | Status: DC
  Administered 2018-07-29 – 2018-07-30 (×2): 1 via NASAL
  Filled 2018-07-29: qty 16

## 2018-07-29 NOTE — Progress Notes (Signed)
No improvement with IV morphine.  Spoke to Dr. Vianne Bulls to a longer acting med.

## 2018-07-29 NOTE — Consult Note (Signed)
[facility] Face-to-Face Psychiatry Consult   Reason for Consult:  Depression Referring Physician:  Hospitalists Patient Identification: Lori Mckenzie MRN:  [id number] Principal Diagnosis: Cirrhosis of liver with ascites ([place]) Diagnosis:  Principal Problem:   Cirrhosis of liver with ascites ([place]) Active Problems:   Type 2 diabetes mellitus (HCC)   GERD (gastroesophageal reflux disease)   OSA on CPAP   Diastolic dysfunction with chronic heart failure ([place])   Essential (primary) hypertension   Total Time spent with patient: 45 minutes  Subjective:   Lori Mckenzie is a 53 y.o. female patient reports that she has been dealing with depression for many years but recently her son and her husband both passed away.  She states that her depression has became worse.  She states that over the years she has been on numerous antidepressants with minimal benefit from most.  She states that she cannot recognize any specific triggers recently except for the death of her husband and her son.  She states that her medical issues such as her cirrhosis of the liver with ascites and having to have paracentesis done regularly does depress her some as well.  She denies any suicidal or homicidal ideations and denies any hallucinations.  HPI:  Per Dr. Cherylann Banas: 53 y.o. female with PMH as noted below including NASH cirrhosis and ascites who presents with abdominal pain, persistent course over at least the last several days, bilateral but somewhat worse on the left and across the middle of her abdomen.  It is associated with increased abdominal distention.  The patient had a large-volume paracentesis yesterday at [facility] and has had them as often as every 2 days for some time.  She recently moved from [place] and has not set up regular outpatient paracentesis.  She has an appointment with a hepatologist arranged for next month.  The patient states that the pain is about the same as it has been over the last week, and has not increased  since her paracentesis yesterday.  She also reports pain to the left inner thigh.  She has a prior history of a DVT but is no longer on anticoagulation.   Patient is seen by this provider via face-to-face.  Patient has somewhat lethargic due to giving medications recently because she was complaining of chest pain.  Patient is pleasant, calm, and cooperative in discussion.  Patient does have a history of depression and reports various antidepressants over the years.  Discussed patient with Dr. Mallie Darting and decided to recommend discontinuation of Celexa and to start patient on Pristiq 50 mg p.o. daily.  Past Psychiatric History: depression, anxiety, no hospitalizations  Risk to Self:   Risk to Others:   Prior Inpatient Therapy:   Prior Outpatient Therapy:    Past Medical History:  Past Medical History:  Diagnosis Date  . Abdominal abscess 08/25/2014  . Acid reflux 08/10/2010  . Acute cervical myofascial strain 02/09/2016  . Acute postoperative pain 08/09/2016  . Anxiety   . Ascites   . Asthma   . Back pain   . Bile leak, postoperative 07/17/2014  . Brittle bone disease   . Cancer ([place])    Uteriine  ca 36yr ago partial hysterectomy  . Cervical disc disease   . Chronic kidney disease   . Collagen vascular disease ([place]    RA  3-4 yrs ago  . COPD (chronic obstructive pulmonary disease) ([place]   . Diabetes mellitus without complication ([place]   . GERD (gastroesophageal reflux disease)   . Hypertension   .  Hypothyroidism   . Left upper quadrant pain 01/09/2014  . Major depressive disorder with single episode 12/05/2011  . Major depressive disorder, single episode 12/05/2011  . Migraines   . NASH (nonalcoholic steatohepatitis)   . Respiratory infection    2/17  . Shock ([place]) 09/18/2014  . Sleep apnea   . Sleep apnea   . Syncope 11/16/2014  . Thyroid disease   . TIA (transient ischemic attack)     Past Surgical History:  Procedure Laterality Date  . ABDOMINAL HYSTERECTOMY    .  CHOLECYSTECTOMY N/A 07/15/2014   Procedure: LAPAROSCOPIC CHOLECYSTECTOMY with liver biopsy ;  Surgeon: Sherri Rad, MD;  Location: [facility] ORS;  Service: General;  Laterality: N/A;  . COLONOSCOPY WITH PROPOFOL N/A 06/23/2014   Procedure: COLONOSCOPY WITH PROPOFOL;  Surgeon: Lollie Sails, MD;  Location[facility] ENDOSCOPY;  Service: Endoscopy;  Laterality: N/A;  . ERCP N/A 07/16/2014   Procedure: ENDOSCOPIC RETROGRADE CHOLANGIOPANCREATOGRAPHY (ERCP);  Surgeon: Clarene Essex, MD;  Location: [facility] ENDOSCOPY;  Service: Endoscopy;  Laterality: N/A;  . ERCP N/A 10/03/2014   Procedure: ENDOSCOPIC RETROGRADE CHOLANGIOPANCREATOGRAPHY (ERCP);  Surgeon: Hulen Luster, MD;  Location: [facility] ENDOSCOPY;  Service: Gastroenterology;  Laterality: N/A;  . ESOPHAGOGASTRODUODENOSCOPY N/A 06/23/2014   Procedure: ESOPHAGOGASTRODUODENOSCOPY (EGD);  Surgeon: Lollie Sails, MD;  Location[facility] ENDOSCOPY;  Service: Endoscopy;  Laterality: N/A;  . ESOPHAGOGASTRODUODENOSCOPY N/A 06/30/2016   Procedure: ESOPHAGOGASTRODUODENOSCOPY (EGD);  Surgeon: Jonathon Bellows, MD;  Location: [facility] ENDOSCOPY;  Service: Endoscopy;  Laterality: N/A;  . ESOPHAGOGASTRODUODENOSCOPY (EGD) WITH PROPOFOL N/A 12/01/2015   Procedure: ESOPHAGOGASTRODUODENOSCOPY (EGD) WITH PROPOFOL;  Surgeon: Lollie Sails, MD;  Location: [facility] ENDOSCOPY;  Service: Endoscopy;  Laterality: N/A;  . TUBAL LIGATION    . WISDOM TOOTH EXTRACTION     Family History:  Family History  Problem Relation Age of Onset  . Lung cancer Mother   . Ulcers Father   . Emphysema Father   . Heart disease Sister   . Ulcers Sister   . Heart disease Brother    Family Psychiatric  History: None reported Social History:  Social History   Substance and Sexual Activity  Alcohol Use No   Comment: occ     Social History   Substance and Sexual Activity  Drug Use No    Social History   Socioeconomic History  . Marital status: Married    Spouse name: Not on file  . Number of children: Not on file   . Years of education: Not on file  . Highest education level: Not on file  Occupational History  . Not on file  Social Needs  . Financial resource strain: Not on file  . Food insecurity    Worry: Not on file    Inability: Not on file  . Transportation needs    Medical: Not on file    Non-medical: Not on file  Tobacco Use  . Smoking status: Never Smoker  . Smokeless tobacco: Never Used  Substance and Sexual Activity  . Alcohol use: No    Comment: occ  . Drug use: No  . Sexual activity: Yes  Lifestyle  . Physical activity    Days per week: Not on file    Minutes per session: Not on file  . Stress: Not on file  Relationships  . Social Herbalist on phone: Not on file    Gets together: Not on file    Attends religious service: Not on file    Active member of club  or organization: Not on file    Attends meetings of clubs or organizations: Not on file    Relationship status: Not on file  Other Topics Concern  . Not on file  Social History Narrative  . Not on file   Additional Social History:    Allergies:   Allergies  Allergen Reactions  . Hydrocodone-Acetaminophen   . Nsaids Other (See Comments)    Liver problems Liver problems   . Tape Swelling  . Vicodin [Hydrocodone-Acetaminophen] Itching and Rash    Labs:  Results for orders placed or performed during the hospital encounter of 07/28/18 (from the past 48 hour(s))  Lipase, blood     Status: None   Collection Time: 07/28/18  7:36 PM  Result Value Ref Range   Lipase 32 11 - 51 U/L    Comment: Performed at [facility], [address] [place], [place] [address]  Comprehensive metabolic panel     Status: Abnormal   Collection Time: 07/28/18  7:36 PM  Result Value Ref Range   Sodium 136 135 - 145 mmol/L   Potassium 4.1 3.5 - 5.1 mmol/L   Chloride 101 98 - 111 mmol/L   CO2 29 22 - 32 mmol/L   Glucose, Bld 150 (H) 70 - 99 mg/dL   BUN 13 6 - 20 mg/dL   Creatinine, Ser 0.63 0.44 - 1.00  mg/dL   Calcium 8.3 (L) 8.9 - 10.3 mg/dL   Total Protein 5.5 (L) 6.5 - 8.1 g/dL   Albumin 3.1 (L) 3.5 - 5.0 g/dL   AST 30 15 - 41 U/L   ALT 20 0 - 44 U/L   Alkaline Phosphatase 92 38 - 126 U/L   Total Bilirubin 1.3 (H) 0.3 - 1.2 mg/dL   GFR calc non Af Amer >60 >60 mL/min   GFR calc Af Amer >60 >60 mL/min   Anion gap 6 5 - 15    Comment: Performed at [facility], [address] [place], [place] [address]  CBC     Status: Abnormal   Collection Time: 07/28/18  7:36 PM  Result Value Ref Range   WBC 4.2 4.0 - 10.5 K/uL   RBC 4.01 3.87 - 5.11 MIL/uL   Hemoglobin 13.3 12.0 - 15.0 g/dL   HCT 38.4 36.0 - 46.0 %   MCV 95.8 80.0 - 100.0 fL   MCH 33.2 26.0 - 34.0 pg   MCHC 34.6 30.0 - 36.0 g/dL   RDW 13.4 11.5 - 15.5 %   Platelets 93 (L) 150 - 400 K/uL    Comment: Immature Platelet Fraction may be clinically indicated, consider ordering this additional test [id number]    nRBC 0.0 0.0 - 0.2 %    Comment: Performed at [facility], [address] [place], [place] [address]  Urinalysis, Complete w Microscopic     Status: Abnormal   Collection Time: 07/28/18  7:36 PM  Result Value Ref Range   Color, Urine AMBER (A) YELLOW    Comment: BIOCHEMICALS MAY BE AFFECTED BY COLOR   APPearance HAZY (A) CLEAR   Specific Gravity, Urine 1.029 1.005 - 1.030   pH 5.0 5.0 - 8.0   Glucose, UA NEGATIVE NEGATIVE mg/dL   Hgb urine dipstick NEGATIVE NEGATIVE   Bilirubin Urine NEGATIVE NEGATIVE   Ketones, ur NEGATIVE NEGATIVE mg/dL   Protein, ur 30 (A) NEGATIVE mg/dL   Nitrite NEGATIVE NEGATIVE   Leukocytes,Ua NEGATIVE NEGATIVE   RBC / HPF 0-5 0 - 5 RBC/hpf  WBC, UA 0-5 0 - 5 WBC/hpf   Bacteria, UA RARE (A) NONE SEEN   Squamous Epithelial / LPF 0-5 0 - 5   Mucus PRESENT     Comment: Performed at [facility], [address] [place], [place] [address]  Protime-INR     Status: Abnormal   Collection Time: 07/28/18  8:00 PM  Result Value Ref Range   Prothrombin Time  16.1 (H) 11.4 - 15.2 seconds   INR 1.3 (H) 0.8 - 1.2    Comment: (NOTE) INR goal varies based on device and disease states. Performed at [facility], [address] [place], [place] [address]   Ammonia     Status: Abnormal   Collection Time: 07/28/18  8:00 PM  Result Value Ref Range   Ammonia 77 (H) 9 - 35 umol/L    Comment: Performed at [facility], [address] [place], [place] [address]  SARS Coronavirus 2 (CEPHEID - Performed in [place] hospital lab), Hosp Order     Status: None   Collection Time: 07/28/18  9:28 PM   Specimen: Nasopharyngeal Swab  Result Value Ref Range   SARS Coronavirus 2 NEGATIVE NEGATIVE    Comment: (NOTE) If result is NEGATIVE SARS-CoV-2 target nucleic acids are NOT DETECTED. The SARS-CoV-2 RNA is generally detectable in upper and lower  respiratory specimens during the acute phase of infection. The lowest  concentration of SARS-CoV-2 viral copies this assay can detect is 250  copies / mL. A negative result does not preclude SARS-CoV-2 infection  and should not be used as the sole basis for treatment or other  patient management decisions.  A negative result may occur with  improper specimen collection / handling, submission of specimen other  than nasopharyngeal swab, presence of viral mutation(s) within the  areas targeted by this assay, and inadequate number of viral copies  (<250 copies / mL). A negative result must be combined with clinical  observations, patient history, and epidemiological information. If result is POSITIVE SARS-CoV-2 target nucleic acids are DETECTED. The SARS-CoV-2 RNA is generally detectable in upper and lower  respiratory specimens dur ing the acute phase of infection.  Positive  results are indicative of active infection with SARS-CoV-2.  Clinical  correlation with patient history and other diagnostic information is  necessary to determine patient infection status.  Positive results do  not  rule out bacterial infection or co-infection with other viruses. If result is PRESUMPTIVE POSTIVE SARS-CoV-2 nucleic acids MAY BE PRESENT.   A presumptive positive result was obtained on the submitted specimen  and confirmed on repeat testing.  While 2019 novel coronavirus  (SARS-CoV-2) nucleic acids may be present in the submitted sample  additional confirmatory testing may be necessary for epidemiological  and / or clinical management purposes  to differentiate between  SARS-CoV-2 and other Sarbecovirus currently known to infect humans.  If clinically indicated additional testing with an alternate test  methodology [phone]) is advised. The SARS-CoV-2 RNA is generally  detectable in upper and lower respiratory sp ecimens during the acute  phase of infection. The expected result is Negative. Fact Sheet for Patients:  StrictlyIdeas.no Fact Sheet for Healthcare Providers: BankingDealers.co.za This test is not yet approved or cleared by the [place] FDA and has been authorized for detection and/or diagnosis of SARS-CoV-2 by FDA under an Emergency Use Authorization (EUA).  This EUA will remain in effect (meaning this test can be used) for the duration of the COVID-19 declaration under Section 564(b)(1)  of the Act, 21 U.S.C. section 360bbb-3(b)(1), unless the authorization is terminated or revoked sooner. Performed at [facility], [address] [place], [place] [address]   Glucose, capillary     Status: Abnormal   Collection Time: 07/28/18 11:57 PM  Result Value Ref Range   Glucose-Capillary 107 (H) 70 - 99 mg/dL  Basic metabolic panel     Status: Abnormal   Collection Time: 07/29/18  4:23 AM  Result Value Ref Range   Sodium 136 135 - 145 mmol/L   Potassium 3.2 (L) 3.5 - 5.1 mmol/L   Chloride 103 98 - 111 mmol/L   CO2 27 22 - 32 mmol/L   Glucose, Bld 138 (H) 70 - 99 mg/dL   BUN 11 6 - 20 mg/dL   Creatinine, Ser 0.53  0.44 - 1.00 mg/dL   Calcium 8.3 (L) 8.9 - 10.3 mg/dL   GFR calc non Af Amer >60 >60 mL/min   GFR calc Af Amer >60 >60 mL/min   Anion gap 6 5 - 15    Comment: Performed at [facility], [address] [place], [place] [address]  CBC     Status: Abnormal   Collection Time: 07/29/18  4:23 AM  Result Value Ref Range   WBC 2.9 (L) 4.0 - 10.5 K/uL   RBC 3.84 (L) 3.87 - 5.11 MIL/uL   Hemoglobin 12.8 12.0 - 15.0 g/dL   HCT 36.7 36.0 - 46.0 %   MCV 95.6 80.0 - 100.0 fL   MCH 33.3 26.0 - 34.0 pg   MCHC 34.9 30.0 - 36.0 g/dL   RDW 13.4 11.5 - 15.5 %   Platelets 83 (L) 150 - 400 K/uL    Comment: Immature Platelet Fraction may be clinically indicated, consider ordering this additional test [id number]    nRBC 0.0 0.0 - 0.2 %    Comment: Performed at [facility], [address] [place], [place] [address]  Glucose, capillary     Status: Abnormal   Collection Time: 07/29/18  5:12 AM  Result Value Ref Range   Glucose-Capillary 139 (H) 70 - 99 mg/dL  Glucose, capillary     Status: Abnormal   Collection Time: 07/29/18  8:18 AM  Result Value Ref Range   Glucose-Capillary 121 (H) 70 - 99 mg/dL  Troponin I (High Sensitivity)     Status: None   Collection Time: 07/29/18 12:53 PM  Result Value Ref Range   Troponin I (High Sensitivity) 5 <18 ng/L    Comment: (NOTE) Elevated high sensitivity troponin I (hsTnI) values and significant  changes across serial measurements may suggest ACS but many other  chronic and acute conditions are known to elevate hsTnI results.  Refer to the "Links" section for chest pain algorithms and additional  guidance. Performed at [facility], [address] [place], [place] [address]   Glucose, capillary     Status: Abnormal   Collection Time: 07/29/18  1:16 PM  Result Value Ref Range   Glucose-Capillary 108 (H) 70 - 99 mg/dL    Current Facility-Administered Medications  Medication Dose Route Frequency Provider Last Rate Last Dose   . 0.9 %  sodium chloride infusion   Intravenous PRN Epifanio Lesches, MD 10 mL/hr at 07/29/18 1200 500 mL at 07/29/18 1200  . albuterol (PROVENTIL) (2.5 MG/3ML) 0.083% nebulizer solution 2.5 mg  2.5 mg Nebulization Q6H PRN Epifanio Lesches, MD      . ALPRAZolam Duanne Moron) tablet 1 mg  1 mg Oral TID Epifanio Lesches,  MD   1 mg at 07/29/18 0829  . bumetanide (BUMEX) tablet 3 mg  3 mg Oral BID Epifanio Lesches, MD      . cefTRIAXone (ROCEPHIN) 1 g in sodium chloride 0.9 % 100 mL IVPB  1 g Intravenous Q24H Epifanio Lesches, MD 200 mL/hr at 07/29/18 1429 1 g at 07/29/18 1429  . citalopram (CELEXA) tablet 40 mg  40 mg Oral Daily Epifanio Lesches, MD   40 mg at 07/29/18 1145  . fluticasone (FLONASE) 50 MCG/ACT nasal spray 1-2 spray  1-2 spray Each Nare Daily Epifanio Lesches, MD   1 spray at 07/29/18 1148  . folic acid (FOLVITE) tablet 1 mg  1 mg Oral Daily Epifanio Lesches, MD   1 mg at 07/29/18 1145  . gabapentin (NEURONTIN) capsule 600 mg  600 mg Oral QID Epifanio Lesches, MD   600 mg at 07/29/18 1430  . insulin aspart (novoLOG) injection 0-9 Units  0-9 Units Subcutaneous Q6H Lance Coon, MD      . lactulose (CHRONULAC) 10 GM/15ML solution 40 g  40 g Oral QID Lance Coon, MD   40 g at 07/29/18 1432  . morphine 2 MG/ML injection 2 mg  2 mg Intravenous Q2H PRN Harrie Foreman, MD   2 mg at 07/29/18 1223  . nadolol (CORGARD) tablet 20 mg  20 mg Oral Daily Epifanio Lesches, MD   20 mg at 07/29/18 1145  . ondansetron (ZOFRAN) tablet 4 mg  4 mg Oral Q6H PRN Lance Coon, MD       Or  . ondansetron [facility]) injection 4 mg  4 mg Intravenous Q6H PRN Lance Coon, MD   4 mg at 07/29/18 1041  . oxyCODONE (Oxy IR/ROXICODONE) immediate release tablet 10 mg  10 mg Oral 6 X Daily Epifanio Lesches, MD   10 mg at 07/29/18 1430  . pantoprazole (PROTONIX) EC tablet 40 mg  40 mg Oral BID Lance Coon, MD   40 mg at 07/29/18 6962  . potassium chloride 10 mEq in 100 mL  IVPB  10 mEq Intravenous Q1 Hr x 4 Epifanio Lesches, MD   Stopped at 07/29/18 1415  . rifaximin (XIFAXAN) tablet 550 mg  550 mg Oral Daily Epifanio Lesches, MD   550 mg at 07/29/18 0909  . sodium chloride flush (NS) 0.9 % injection 3 mL  3 mL Intravenous Q12H Epifanio Lesches, MD   3 mL at 07/29/18 0837  . sodium chloride flush (NS) 0.9 % injection 3 mL  3 mL Intravenous PRN Epifanio Lesches, MD      . spironolactone (ALDACTONE) tablet 100 mg  100 mg Oral Daily Epifanio Lesches, MD   100 mg at 07/29/18 1144  . vitamin B-12 (CYANOCOBALAMIN) tablet 2,000 mcg  2,000 mcg Oral Daily Epifanio Lesches, MD   2,000 mcg at 07/29/18 0831    Musculoskeletal: Strength & Muscle Tone: decreased Gait & Station: Patient remained in bed during evaluation Patient leans: N/A  Psychiatric Specialty Exam: Physical Exam  Nursing note and vitals reviewed. Constitutional: She is oriented to person, place, and time. She appears well-developed and well-nourished.  Cardiovascular: Normal rate.  Respiratory: Effort normal.  Musculoskeletal: Normal range of motion.  Neurological: She is alert and oriented to person, place, and time.    Review of Systems  Constitutional: Negative.   HENT: Negative.   Respiratory: Negative.   Genitourinary: Negative.   Musculoskeletal: Negative.   Skin: Negative.   Endo/Heme/Allergies: Negative.   Psychiatric/Behavioral: Positive for depression. Negative for hallucinations, substance abuse  and suicidal ideas.    Blood pressure 98/66, pulse 69, temperature 98.3 F (36.8 C), temperature source Oral, resp. rate 17, height 5' 2"  (1.575 m), weight 103.6 kg, SpO2 95 %.Body mass index is 41.76 kg/m.  General Appearance: Casual  Eye Contact:  Fair  Speech:  Clear and Coherent and Slow  Volume:  Decreased  Mood:  Depressed  Affect:  Flat  Thought Process:  Coherent and Descriptions of Associations: Intact  Orientation:  Full (Time, Place, and Person)   Thought Content:  WDL  Suicidal Thoughts:  No  Homicidal Thoughts:  No  Memory:  Immediate;   Good Recent;   Good Remote;   Good  Judgement:  Fair  Insight:  Fair  Psychomotor Activity:  Decreased  Concentration:  Concentration: Good and Attention Span: Good  Recall:  Good  Fund of Knowledge:  Good  Language:  Good  Akathisia:  No  Handed:    AIMS (if indicated):     Assets:  Communication Skills Desire for Improvement Financial Resources/Insurance Housing Resilience Social Support  ADL's:  Impaired  Cognition:  WNL  Sleep:        Treatment Plan Summary: Daily contact with patient to assess and evaluate symptoms and progress in treatment and Medication management  Discontinue Celexa Start Pristiq 50 mg PO Daily Social work consult for outpatient treatment assistance  Disposition: No evidence of imminent risk to self or others at present.   Patient does not meet criteria for psychiatric inpatient admission.  Will continue to follow for evaluation of medication changes  Lewis Shock, FNP 07/29/2018 2:48 PM

## 2018-07-29 NOTE — Consult Note (Signed)
[facility] GI Inpatient Consult Note   Kathline Magic, M.D.  Reason for Consult: NASH cirrhosis with ascites, opinion needed on TIPS for treatment.   Attending Requesting Consult: Lance Coon, M.D.  Outpatient Primary Physician: [facility] physicians  History of Present Illness: Lori Mckenzie is a 53 y.o. female with a history of [name] cirrhosis presenting for left thigh pain, abdominal distention.  Patient has been in a depressed mood since loss of her husband due to health problems recently.  She M presented to the emergency room 1 day after undergoing a large-volume paracentesis of approximately 4.2 L at the [facility] facility in [facility].  She has a history of diuretic refractory ascites and usually gets serial outpatient abdominal paracenteses twice a week or more.  She relocated from [place] in recent months and has been referred to a hepatologist at [facility], Dr. Manuella Ghazi, who will be seeing her on August 6 for an outpatient appointment. Patient complains of mild diffuse abdominal discomfort but is overall less distended since paracentesis. Patient has undergone gastroenterology consultations in both the inpatient and outpatient settings in the local area.  Patient was a former patient of Algis Liming, Designer, jewellery in our office at [facility] gastroenterology.  She then had been admitted to [facility] hospital in 2018 and underwent an EGD by Dr. Jonathon Bellows revealing portal hypertensive gastropathy and only grade 1 esophageal varices.   The patient has been taking Bumex as prescribed by her gastroenterologist at [place] system for her diuretic refractory ascites.  She is currently on the process of transferring all of her hepatology and GI care to [facility] healthcare system.  The patient has questions about tips procedure and its potential benefit and her management of ascites.  She is already been directed on this procedure, however,  and appears to know quite a lot about the risks of worsening hip encephalopathy.  She has a baseline hepatic encephalopathy which he expects to get worse after TIPS procedure.  Preliminary arrangements are being made at [place] to get this procedure done after hepatologist approval.  Not exactly sure why the patient is asking for further recommendations.   Past Medical History:  Past Medical History:  Diagnosis Date  . Abdominal abscess 08/25/2014  . Acid reflux 08/10/2010  . Acute cervical myofascial strain 02/09/2016  . Acute postoperative pain 08/09/2016  . Anxiety   . Ascites   . Asthma   . Back pain   . Bile leak, postoperative 07/17/2014  . Brittle bone disease   . Cancer ([place])    Uteriine  ca 62yr ago partial hysterectomy  . Cervical disc disease   . Chronic kidney disease   . Collagen vascular disease ([place]    RA  3-4 yrs ago  . COPD (chronic obstructive pulmonary disease) ([place]   . Diabetes mellitus without complication ([place]   . GERD (gastroesophageal reflux disease)   . Hypertension   . Hypothyroidism   . Left upper quadrant pain 01/09/2014  . Major depressive disorder with single episode 12/05/2011  . Major depressive disorder, single episode 12/05/2011  . Migraines   . NASH (nonalcoholic steatohepatitis)   . Respiratory infection    2/17  . Shock ([place] 09/18/2014  . Sleep apnea   . Sleep apnea   . Syncope 11/16/2014  . Thyroid disease   . TIA (transient ischemic attack)     Problem List: Patient Active Problem List   Diagnosis Date Noted  .  UTI (urinary tract infection) 06/11/2017  . Neurogenic pain 02/13/2017  . Chest pain 12/20/2016  . Chronic feet pain (Secondary source of pain) (Bilateral) (R>L) 10/26/2016  . Acute hepatic encephalopathy 09/23/2016  . Low back pain due to L1-2 disc extrusion (caudal) (Left) 07/11/2016  . Abdominal pain 06/28/2016  . Constipation 06/20/2016  . Hyperglycemia 06/20/2016  . Nausea without vomiting  06/06/2016  . Chronic lower extremity pain (Bilateral) 05/19/2016  . Spinal stenosis, thoracic region (T10-11) 05/19/2016  . Diabetes mellitus, insulin dependent (IDDM), uncontrolled ([place]) 04/12/2016  . Chronic sacroiliac joint pain (Bilateral) (L>R) 03/23/2016  . Chronic upper extremity pain (Left) 03/10/2016  . Chronic radicular cervical pain (L) 03/10/2016  . Osteoarthritis 02/24/2016  . Allodynia 02/09/2016  . Chronic pain syndrome 01/07/2016  . Opiate withdrawal ([place]) 12/22/2015  . Elevated liver enzymes 09/28/2015  . Cirrhosis of liver with ascites ([place]) 09/24/2015  . Cirrhosis ([place]) 09/24/2015  . Altered mental status 07/24/2015  . Uncontrolled diabetes mellitus ([place]) 07/24/2015  . Radicular pain of thoracic region 04/06/2015  . Hepatic encephalopathy ([place]) 03/11/2015  . Elevated sedimentation rate 03/05/2015  . Elevated C-reactive protein (CRP) 03/05/2015  . Lumbar facet syndrome (Bilateral) (R>L) 03/05/2015  . Cervical spondylosis 03/05/2015  . Lumbar spondylosis 03/05/2015  . Encounter for chronic pain management 03/05/2015  . Chronic shoulder pain (Bilateral) (R>L) 03/05/2015  . Chronic carpal tunnel syndrome (Bilateral) 03/05/2015  . Chronic hip pain (Bilateral) (L>R) 03/05/2015  . Chronic upper back pain (Bilateral) (L>R) 03/05/2015  . Osteoporosis, idiopathic 03/05/2015  . Abnormal MRI, lumbar spine (02/03/2015) 03/05/2015  . Thoracic radiculitis 02/05/2015  . Vitamin D deficiency 01/13/2015  . B12 deficiency 01/13/2015  . Folate deficiency 01/13/2015  . Subacute lumbar radiculopathy (left side) (S1 dermatome) 12/10/2014  . Drowsiness 11/16/2014  . Episode of syncope 11/16/2014  . Somnolence 11/16/2014  . Opiate use (75 MME/Day) 10/28/2014  . Long term prescription opiate use 10/28/2014  . Long term current use of opiate analgesic 10/28/2014  . Encounter for therapeutic drug level monitoring 10/28/2014  . Chronic epigastric abdominal pain 10/28/2014  . Chronic  low back pain (Primary Source of Pain) (Bilateral) (R>L) 10/28/2014  . Chronic neck pain [facility] source of pain) (Bilateral) (R>L) 10/28/2014  . Ascites 09/05/2014  . NASH (nonalcoholic steatohepatitis) 09/05/2014  . Hypokalemia 09/05/2014  . Dysthymia 08/05/2014  . Other social stressor 08/05/2014  . Steatohepatitis 07/15/2014  . Type 2 diabetes mellitus ([place]) 07/12/2014  . COPD with acute exacerbation ([place]) 07/12/2014  . GERD (gastroesophageal reflux disease) 07/12/2014  . OSA on CPAP 07/12/2014  . Anxiety 07/12/2014  . 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 Lumbar and sacral osteoarthritis 03/26/2014  . Myofascial pain 03/26/2014  . Lumbar spinal stenosis 03/26/2014  . Lumbosacral spondylosis without myelopathy 03/26/2014  . 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 Lumbar central spinal stenosis (L1-2 and L4-5) 03/26/2014  . Spondylosis of lumbar region without myelopathy or radiculopathy 03/26/2014  . Breath shortness 01/09/2014  . Diastolic dysfunction with chronic heart failure ([place]) 08/06/2013  . Airway hyperreactivity 08/06/2013  . Essential (primary) hypertension 08/06/2013  . Asthma 08/06/2013  . Clinical depression 12/05/2011    Past Surgical History: Past Surgical History:  Procedure Laterality Date  . ABDOMINAL HYSTERECTOMY    . CHOLECYSTECTOMY N/A 07/15/2014   Procedure: LAPAROSCOPIC CHOLECYSTECTOMY with liver biopsy ;  Surgeon: Sherri Rad, MD;  Location: [facility] ORS;  Service: General;  Laterality: N/A;  . COLONOSCOPY WITH PROPOFOL N/A 06/23/2014   Procedure: COLONOSCOPY WITH PROPOFOL;  Surgeon: Lollie Sails, MD;  Location: [facility] ENDOSCOPY;  Service: Endoscopy;  Laterality: N/A;  . ERCP N/A 07/16/2014   Procedure: ENDOSCOPIC RETROGRADE CHOLANGIOPANCREATOGRAPHY (ERCP);  Surgeon: Clarene Essex, MD;  Location: [facility] END[facility];  Service: Endoscopy;  Laterality: N/A;  . ERCP N/A 10/03/2014   Procedure: ENDOSCOPIC RETROGRADE CHOLANGIOPANCREATOGRAPHY (ERCP);   Surgeon: Hulen Luster, MD;  Location[facility] ENDOSCOPY;  Service: Gastroenterology;  Laterality: N/A;  . ESOPHAGOGASTRODUODENOSCOPY N/A 06/23/2014   Procedure: ESOPHAGOGASTRODUODENOSCOPY (EGD);  Surgeon: Lollie Sails, MD;  Location: [facility] ENDOSCOPY;  Service: Endoscopy;  Laterality: N/A;  . ESOPHAGOGASTRODUODENOSCOPY N/A 06/30/2016   Procedure: ESOPHAGOGASTRODUODENOSCOPY (EGD);  Surgeon: Jonathon Bellows, MD;  Location: [facility] ENDOSCOPY;  Service: Endoscopy;  Laterality: N/A;  . ESOPHAGOGASTRODUODENOSCOPY (EGD) WITH PROPOFOL N/A 12/01/2015   Procedure: ESOPHAGOGASTRODUODENOSCOPY (EGD) WITH PROPOFOL;  Surgeon: Lollie Sails, MD;  Location[facility] END[facility]COPY;  Service: Endoscopy;  Laterality: N/A;  . TUBAL LIGATION    . WISDOM TOOTH EXTRACTION      Allergies: Allergies  Allergen Reactions  . Hydrocodone-Acetaminophen   . Nsaids Other (See Comments)    Liver problems Liver problems   . Tape Swelling  . Vicodin [Hydrocodone-Acetaminophen] Itching and Rash    Home Medications: Medications Prior to Admission  Medication Sig Dispense Refill Last Dose  . albuterol (PROVENTIL HFA;VENTOLIN HFA) 108 (90 Base) MCG/ACT inhaler Inhale 2 puffs into the lungs every 6 (six) hours as needed for wheezing or shortness of breath.   prn at prn  . ALPRAZolam (XANAX) 1 MG tablet Take 1 mg by mouth 3 (three) times daily.   Past Week at Unknown time  . bumetanide (BUMEX) 1 MG tablet Take 3 mg by mouth 2 (two) times a day.   Past Week at Unknown time  . cholecalciferol (VITAMIN D) 1000 units tablet Take 2,000 Units by mouth daily.   Past Week at Unknown time  . citalopram (CELEXA) 40 MG tablet Take 40 mg by mouth daily.    Past Week at Unknown time  . diclofenac sodium (VOLTAREN) 1 % GEL Apply 4 g topically every 8 (eight) hours as needed. (Patient taking differently: Apply 4 g topically every 8 (eight) hours as needed (FOR PAIN). ) 100 g 5 prn at prn  . nystatin (MYCOSTATIN/NYSTOP) powder Apply 1 g topically 3 (three)  times daily as needed. For irritation 30 g 0 prn at prn  . promethazine (PHENERGAN) 12.5 MG tablet Take 2 tablets (25 mg total) by mouth every 6 (six) hours as needed for nausea or vomiting. (Patient taking differently: Take 12.5-25 mg by mouth every 6 (six) hours as needed for nausea or vomiting. ) 30 tablet 0 prn at prn  . fluticasone (FLONASE) 50 MCG/ACT nasal spray Place 1-2 sprays into both nostrils daily.      Derald Macleod Factor (INTRINSI B12-FOLATE) 737-106-26 MCG-MCG-MG TABS Take 1 tablet by mouth daily.      Marland Kitchen gabapentin (NEURONTIN) 300 MG capsule Take 600 mg by mouth 4 (four) times daily.      . insulin aspart (NOVOLOG) 100 UNIT/ML injection Inject 44 Units into the skin 3 (three) times daily with meals.     . insulin regular human CONCENTRATED (HUMULIN R) 500 UNIT/ML injection Inject 100-200 Units into the skin 2 (two) times a day. Inject 200 units in the morning, inject 100 units nightly. Based on glucose readings     . ipratropium-albuterol (DUONEB) 0.5-2.5 (3) MG/3ML SOLN Take 3 mLs by nebulization every 4 (four) hours as needed. For shortness of breath/wheezing     .  lactulose (CHRONULAC) 10 GM/15ML solution Take 30 mLs (20 g total) by mouth 3 (three) times daily. (Patient taking differently: Take 40 g by mouth 4 (four) times daily. ) 1892 mL 0   . Oxycodone HCl 10 MG TABS Take 10 mg by mouth 6 (six) times daily.     . pantoprazole (PROTONIX) 40 MG tablet Take 40 mg by mouth 2 (two) times daily before a meal.     . potassium chloride SA (K-DUR,KLOR-CON) 20 MEQ tablet Take 20 mEq by mouth daily as needed. For high blood pressure     . rifaximin (XIFAXAN) 550 MG TABS tablet Take 550 mg by mouth daily.      . rizatriptan (MAXALT) 10 MG tablet Take 10 mg by mouth as needed for migraine.      Marland Kitchen spironolactone (ALDACTONE) 100 MG tablet Take 100 mg by mouth daily.      . vitamin B-12 (CYANOCOBALAMIN) 1000 MCG tablet Take 2,000 mcg by mouth daily.      Home medication  reconciliation was completed with the patient.   Scheduled Inpatient Medications:   . ALPRAZolam  1 mg Oral TID  . bumetanide  3 mg Oral BID  . citalopram  40 mg Oral Daily  . fluticasone  1-2 spray Each Nare Daily  . folic acid  1 mg Oral Daily  . gabapentin  600 mg Oral QID  . insulin aspart  0-9 Units Subcutaneous Q6H  . lactulose  40 g Oral QID  . nadolol  20 mg Oral Daily  . oxyCODONE  10 mg Oral 6 X Daily  . pantoprazole  40 mg Oral BID  . rifaximin  550 mg Oral Daily  . sodium chloride flush  3 mL Intravenous Q12H  . spironolactone  100 mg Oral Daily  . vitamin B-12  2,000 mcg Oral Daily    Continuous Inpatient Infusions:   . sodium chloride 500 mL (07/29/18 1200)  . cefTRIAXone (ROCEPHIN)  IV    . potassium chloride 10 mEq (07/29/18 1203)    PRN Inpatient Medications:  sodium chloride, albuterol, morphine injection, ondansetron **OR** ondansetron (ZOFRAN) IV, sodium chloride flush  Family History: family history includes Emphysema in her father; Heart disease in her brother and sister; Lung cancer in her mother; Ulcers in her father and sister.   GI Family History: Negative  Social History:   reports that she has never smoked. She has never used smokeless tobacco. She reports that she does not drink alcohol or use drugs. The patient denies ETOH, tobacco, or drug use.    Review of Systems: Review of Systems - General ROS: positive for  - fatigue and sleep disturbance negative for - fever Psychological ROS: positive for - anxiety, depression and memory difficulties negative for - suicidal ideation ENT ROS: negative Allergy and Immunology ROS: negative Hematological and Lymphatic ROS: negative for - bleeding problems, bruising or jaundice Endocrine ROS: positive for - polydipsia/polyuria Cardiovascular ROS: no chest pain or dyspnea on exertion Gastrointestinal ROS: as above in HPI Genito-Urinary ROS: no dysuria, trouble voiding, or hematuria Musculoskeletal  ROS: positive for - pain in thigh - left negative for - gait disturbance Neurological ROS: no TIA or stroke symptoms Dermatological ROS: negative  Physical Examination: BP 115/86   Pulse 75   Temp 97.9 F (36.6 C) (Oral)   Resp 18   Ht 5' 2"  (1.575 m)   Wt 103.6 kg   SpO2 100%   BMI 41.76 kg/m  Physical Exam Vitals signs and nursing  note reviewed.  Constitutional:      General: She is not in acute distress.    Appearance: She is well-developed. She is obese. She is not ill-appearing, toxic-appearing or diaphoretic.  HENT:     Head: Normocephalic and atraumatic.  Eyes:     Pupils: Pupils are equal, round, and reactive to light.  Cardiovascular:     Heart sounds: Normal heart sounds.  Pulmonary:     Effort: Pulmonary effort is normal.     Breath sounds: Normal breath sounds. No rhonchi.  Abdominal:     General: Abdomen is protuberant. Bowel sounds are normal. There is distension.     Palpations: Abdomen is soft. There is shifting dullness and fluid wave. There is no splenomegaly, mass or pulsatile mass.     Tenderness: There is no abdominal tenderness.     Hernia: No hernia is present.  Skin:    General: Skin is warm and dry.  Neurological:     General: No focal deficit present.     Mental Status: She is alert.  Psychiatric:        Mood and Affect: Mood is depressed. Mood is not anxious.     Data: Lab Results  Component Value Date   WBC 2.9 (L) 07/29/2018   HGB 12.8 07/29/2018   HCT 36.7 07/29/2018   MCV 95.6 07/29/2018   PLT 83 (L) 07/29/2018   Recent Labs  Lab 07/28/18 1936 07/29/18 0423  HGB 13.3 12.8   Lab Results  Component Value Date   NA 136 07/29/2018   K 3.2 (L) 07/29/2018   CL 103 07/29/2018   CO2 27 07/29/2018   BUN 11 07/29/2018   CREATININE 0.53 07/29/2018   Lab Results  Component Value Date   ALT 20 07/28/2018   AST 30 07/28/2018   ALKPHOS 92 07/28/2018   BILITOT 1.3 (H) 07/28/2018   Recent Labs  Lab 07/28/18 2000  INR 1.3*    CBC Latest Ref Rng & Units 07/29/2018 07/28/2018 07/10/2017  WBC 4.0 - 10.5 K/uL 2.9(L) 4.2 3.5(L)  Hemoglobin 12.0 - 15.0 g/dL 12.8 13.3 14.4  Hematocrit 36.0 - 46.0 % 36.7 38.4 40.4  Platelets 150 - 400 K/uL 83(L) 93(L) 72(L)    STUDIES: US Venous Img Lower Unilateral Left  Result Date: 07/28/2018 CLINICAL DATA:  Pain EXAM: LEFT LOWER EXTREMITY VENOUS DOPPLER ULTRASOUND TECHNIQUE: Gray-scale sonography with graded compression, as well as color Doppler and duplex ultrasound were performed to evaluate the lower extremity deep venous systems from the level of the common femoral vein and including the common femoral, femoral, profunda femoral, popliteal and calf veins including the posterior tibial, peroneal and gastrocnemius veins when visible. The superficial great saphenous vein was also interrogated. Spectral Doppler was utilized to evaluate flow at rest and with distal augmentation maneuvers in the common femoral, femoral and popliteal veins. COMPARISON:  None. FINDINGS: Contralateral Common Femoral Vein: Respiratory phasicity is normal and symmetric with the symptomatic side. No evidence of thrombus. Normal compressibility. Common Femoral Vein: No evidence of thrombus. Normal compressibility, respiratory phasicity and response to augmentation. Saphenofemoral Junction: No evidence of thrombus. Normal compressibility and flow on color Doppler imaging. Profunda Femoral Vein: No evidence of thrombus. Normal compressibility and flow on color Doppler imaging. Femoral Vein: No evidence of thrombus. Normal compressibility, respiratory phasicity and response to augmentation. Popliteal Vein: No evidence of thrombus. Normal compressibility, respiratory phasicity and response to augmentation. Calf Veins: No evidence of thrombus. Normal compressibility and flow on color Doppler imaging. Superficial Great  Saphenous Vein: No evidence of thrombus. Normal compressibility. Venous Reflux:  None. Other Findings:  None.  IMPRESSION: No evidence of deep venous thrombosis. Electronically Signed   By: Constance Holster M.D.   On: 07/28/2018 21:27   @IMAGES @  Assessment:  1.  Diuretic refractory ascites- requiring several outpatient paracenteses.  Patient had one large volume paracentesis yesterday and expects to get another 1 here at [facility], that is ordered.  TIPS is a reasonable bridging therapy for refractory ascites and she plans on discussing liver transplantation options with her hepatologist.  I agree overall with current plans to obtain with close observation by her hepatologist.  2.  [name] cirrhosis-appears stable with some symptoms as described above along with mild hepatic encephalopathy.  COVID-19 status: Negative test  Recommendations:  1.  As above. 2.  Low-sodium diet, patient claims she adheres to this at home. 3.  Diuretic therapy as tolerated. 4.  Continue serial paracenteses. 5.  Rule out SBP. 6.  If SBP ruled out, patient could probably be discharged from a GI standpoint with close follow-up at [facility] hepatology. 7.  I will be available as needed to address any concerns in the interim.    Thank you for the consult. Please call with questions or concerns.  Olean Ree, "Lanny Hurst MD [facility] [place], [place] [address] [phone]  07/29/2018 12:44 PM

## 2018-07-29 NOTE — Progress Notes (Signed)
[facility] NAME: Lori Mckenzie    MR#:  [id number]  DATE OF BIRTH:  1965/05/16  SUBJECTIVE: Admitted for abdominal pain, ascites, patient usually gets paracentesis every week sometimes twice a week and going on for a long time, patient is in the process of getting TIPS procedure in [place] but she relocated to [place] and trying to find a gastroenterology here.  She is admitted for abdominal pain, ascites.  CHIEF COMPLAINT:   Chief Complaint  Patient presents with  . Abdominal Pain  No fever or shortness of breath.  Recently lost her husband 10 days ago, last husband patient's son 2 weeks ago, very depressed.  Patient has 3 sons 2 of them passed away and one son is in [place] so she moved with her son.  REVIEW OF SYSTEMS:   ROS CONSTITUTIONAL: No fever, fatigue or weakness.  EYES: No blurred or double vision.  EARS, NOSE, AND THROAT: No tinnitus or ear pain.  RESPIRATORY: No cough, shortness of breath, wheezing or hemoptysis.  CARDIOVASCULAR: No chest pain, orthopnea, edema.  GASTROINTESTINAL: No nausea, vomiting, diarrhea, does have abdominal pain which is generalized, patient complains of pain on the right side where she had a paracentesis before, area of ecchymosis present and also area of induration both does not look infected.  GENITOURINARY: No dysuria, hematuria.  ENDOCRINE: No polyuria, nocturia,  HEMATOLOGY: No anemia, easy bruising or bleeding SKIN: No rash or lesion. MUSCULOSKELETAL: No joint pain or arthritis.   NEUROLOGIC: No tingling, numbness, weakness.  PSYCHIATRY: Depressed but denies any suicidal or homicidal ideation. DRUG ALLERGIES:   Allergies  Allergen Reactions  . Hydrocodone-Acetaminophen   . Nsaids Other (See Comments)    Liver problems Liver problems   . Tape Swelling  . Vicodin [Hydrocodone-Acetaminophen] Itching and Rash    VITALS:  Blood pressure 101/66, pulse 78,  temperature 97.9 F (36.6 C), temperature source Oral, resp. rate 18, height 5' 2"  (1.575 m), weight 103.6 kg, SpO2 96 %.  PHYSICAL EXAMINATION:  GENERAL:  53 y.o.-year-old patient lying in the bed with no acute distress.  EYES: Pupils equal, round, reactive to light . No scleral icterus. Extraocular muscles intact.  HEENT: Head atraumatic, normocephalic. Oropharynx and nasopharynx clear.  NECK:  Supple, no jugular venous distention. No thyroid enlargement, no tenderness.  LUNGS: Normal breath sounds bilaterally, no wheezing, rales,rhonchi or crepitation. No use of accessory muscles of respiration.  CARDIOVASCULAR: S1, S2 normal. No murmurs, rubs, or gallops.  ABDOMEN: Fluid thrill present, soft, distended, bowel sounds present, area of induration present in the right upper quadrant which is not showing any evidence of infection, area of ecchymosis where she had a paracentesis attempt before, both areas does not look infected but patient feels tender there. EXTREMITIES: No pedal edema, cyanosis, or clubbing.  NEUROLOGIC: Cranial nerves II through XII are intact. Muscle strength 5/5 in all extremities. Sensation intact. Gait not checked.  PSYCHIATRIC: The patient is alert and oriented x 3.  SKIN: No obvious rash, lesion, or ulcer.    LABORATORY PANEL:   CBC Recent Labs  Lab 07/29/18 0423  WBC 2.9*  HGB 12.8  HCT 36.7  PLT 83*   ------------------------------------------------------------------------------------------------------------------  Chemistries  Recent Labs  Lab 07/28/18 1936 07/29/18 0423  NA 136 136  K 4.1 3.2*  CL 101 103  CO2 29 27  GLUCOSE 150* 138*  BUN 13 11  CREATININE 0.63 0.53  CALCIUM 8.3* 8.3*  AST 30  --  ALT 20  --   ALKPHOS 92  --   BILITOT 1.3*  --    ------------------------------------------------------------------------------------------------------------------  Cardiac Enzymes No results for input(s): TROPONINI in the last 168  hours. ------------------------------------------------------------------------------------------------------------------  RADIOLOGY:  US Venous Img Lower Unilateral Left  Result Date: 07/28/2018 CLINICAL DATA:  Pain EXAM: LEFT LOWER EXTREMITY VENOUS DOPPLER ULTRASOUND TECHNIQUE: Gray-scale sonography with graded compression, as well as color Doppler and duplex ultrasound were performed to evaluate the lower extremity deep venous systems from the level of the common femoral vein and including the common femoral, femoral, profunda femoral, popliteal and calf veins including the posterior tibial, peroneal and gastrocnemius veins when visible. The superficial great saphenous vein was also interrogated. Spectral Doppler was utilized to evaluate flow at rest and with distal augmentation maneuvers in the common femoral, femoral and popliteal veins. COMPARISON:  None. FINDINGS: Contralateral Common Femoral Vein: Respiratory phasicity is normal and symmetric with the symptomatic side. No evidence of thrombus. Normal compressibility. Common Femoral Vein: No evidence of thrombus. Normal compressibility, respiratory phasicity and response to augmentation. Saphenofemoral Junction: No evidence of thrombus. Normal compressibility and flow on color Doppler imaging. Profunda Femoral Vein: No evidence of thrombus. Normal compressibility and flow on color Doppler imaging. Femoral Vein: No evidence of thrombus. Normal compressibility, respiratory phasicity and response to augmentation. Popliteal Vein: No evidence of thrombus. Normal compressibility, respiratory phasicity and response to augmentation. Calf Veins: No evidence of thrombus. Normal compressibility and flow on color Doppler imaging. Superficial Great Saphenous Vein: No evidence of thrombus. Normal compressibility. Venous Reflux:  None. Other Findings:  None. IMPRESSION: No evidence of deep venous thrombosis. Electronically Signed   By: Constance Holster M.D.   On:  07/28/2018 21:27    EKG:   Orders placed or performed during the hospital encounter of 07/28/18  . EKG 12-Lead  . EKG 12-Lead    ASSESSMENT AND PLAN:   53 year old female patient with history of cirrhosis of the liver with ascites, diabetes mellitus type 2, obstructive sleep apnea, diastolic heart failure, essential hypertension, GERD came in because of abdominal pain, admitted repeat paracentesis and also have a gastroenterologist for TIPS procedure planning.  #1 history of liver cirrhosis, with recurrent paracentesis, last 1 was a week ago now being admitted for abdominal swelling, weight gain, repeat paracentesis under ultrasound-guidance, empirically give IV Rocephin as she has abdominal pain.  To cover for SBP.  2.  History of liver cirrhosis, patient is on spironolactone, recently started on Bumex in addition to spironolactone, not on Lasix anymore as per her.  Will restart the Bumex. 3.  Hypokalemia secondary to diuretics, replace potassium. 4.  Morbid obesity, continue CPAP at night. 5.  Complains of left leg pain, ultrasound of her legs showed no DVT on left side. #6 portal hypertension, patient had EGD in 2018 by Dr. Vicente Males which showed moderate portal hypertensive gastropathy gastric fundus and body.  Patient not on beta-blockers at home unsure why.  Will start low-dose beta-blocker in the hospital, appreciate gastroenterology consult. 7 .  Adjustment disorder, patient lost her husband, son within gap of 2 weeks and she is crying, get psychiatric consult, denies suicidal or homicidal ideation. 8.  History of neuropathy, Neurontin started. 9.  Anxiety, patient on Xanax started. 10.  History of COPD, no wheezing, use albuterol inhaler as needed. 11.  Because of liver cirrhosis prone for hepatic encephalopathy which he is on lactulose, rifaximin.  Continue them. 12.  Because of paracentesis she is scheduled for, she is n.p.o. now so  hold her  U500 insulin for now, continue sliding  scale insulin with coverage. 13.  History of depression, continue Celexa 40 mg daily. 14.  Chronic abdominal pain patient is on oxycodone 10 mg 6 times daily as per the chart.   All the records are reviewed and case discussed with Care Management/Social Workerr. Management plans discussed with the patient, family and they are in agreement.  CODE STATUS: Full code  TOTAL TIME TAKING CARE OF THIS PATIENT: 40 minutes.   POSSIBLE D/C IN 1-2 DAYS, DEPENDING ON CLINICAL CONDITION.   Epifanio Lesches M.D on 07/29/2018 at 10:41 AM  Between 7am to 6pm - Pager - [phone]  After 6pm go to [email or web address] - password [email or web address] [facility]  Office  [phone]  CC: Primary care physician; Ashkin, Neldon Labella, MD   Note: This dictation was prepared with Dragon dictation along with smaller phrase technology. Any transcriptional errors that result from this process are unintentional.

## 2018-07-29 NOTE — Plan of Care (Signed)
  Problem: Clinical Measurements: Goal: Respiratory complications will improve Outcome: Progressing Goal: Cardiovascular complication will be avoided Outcome: Progressing   Problem: Coping: Goal: Level of anxiety will decrease Outcome: Progressing   Problem: Pain Managment: Goal: General experience of comfort will improve Outcome: Progressing

## 2018-07-29 NOTE — Progress Notes (Signed)
C/o R shoulder pain radiating to her chest.  Described as severe pressure.  EKG obtained.  Morphine 2 mg slow IVP given. Dr. Vianne Bulls alerted. Troponin level ordered. EKG shows no abnormalities.

## 2018-07-29 NOTE — Progress Notes (Signed)
Pt refused our CPAP. Pt stated that she had a sleep study that stated she no longer needed to use Cpap. Currently pt does not wear Cpap and home and does not wish to wear CPAP here.

## 2018-07-30 ENCOUNTER — Observation Stay: Payer: Medicaid - Out of State

## 2018-07-30 ENCOUNTER — Encounter: Payer: Self-pay | Admitting: Internal Medicine

## 2018-07-30 LAB — GLUCOSE, CAPILLARY
Glucose-Capillary: 139 mg/dL — ABNORMAL HIGH (ref 70–99)
Glucose-Capillary: 122 mg/dL — ABNORMAL HIGH (ref 70–99)

## 2018-07-30 MED ORDER — INSULIN ASPART 100 UNIT/ML ~~LOC~~ SOLN: 0.0000 [IU] | Freq: Every day | SUBCUTANEOUS | Status: DC

## 2018-07-30 MED ORDER — INSULIN ASPART 100 UNIT/ML ~~LOC~~ SOLN
0.0000 [IU] | Freq: Three times a day (TID) | SUBCUTANEOUS | Status: DC
  Administered 2018-07-30: 13:00:00 1 [IU] via SUBCUTANEOUS
  Filled 2018-07-30: qty 1

## 2018-07-30 MED ORDER — LACTULOSE 10 GM/15ML PO SOLN: 20.0000 g | Freq: Four times a day (QID) | ORAL | Status: DC

## 2018-07-30 NOTE — Progress Notes (Signed)
Advance care planning  Purpose of Encounter Cirrhosis, recurrent ascites  Parties in Attendance Patient.  Alert and oriented.  Able to make medical decisions.  Patients Decisional capacity No documented healthcare power of attorney or ACP documents.  Discussed in detail regarding cirrhosis, recurrent ascites.  Treatment plan , prognosis discussed.  All questions answered.  Discussed extensively that patient needs close follow-up with [facility] hepatology for her TIPS procedure   Code status- FULL CODE  Time spent - 17 minutes

## 2018-07-30 NOTE — Discharge Summary (Signed)
[facility] NAME: Lori Mckenzie    MR#:  [id number]  DATE OF BIRTH:  06-19-1965  DATE OF ADMISSION:  07/28/2018 ADMITTING PHYSICIAN: Lori Coon, MD  DATE OF DISCHARGE: 07/30/2018  2:14 PM  PRIMARY CARE PHYSICIAN: Ashkin, Neldon Labella, MD   ADMISSION DIAGNOSIS:  Other ascites [R18.8]  DISCHARGE DIAGNOSIS:  Principal Problem:   Cirrhosis of liver with ascites ([place]) Active Problems:   Type 2 diabetes mellitus (HCC)   GERD (gastroesophageal reflux disease)   OSA on CPAP   Diastolic dysfunction with chronic heart failure ([place])   Essential (primary) hypertension   SECONDARY DIAGNOSIS:   Past Medical History:  Diagnosis Date  . Abdominal abscess 08/25/2014  . Acid reflux 08/10/2010  . Acute cervical myofascial strain 02/09/2016  . Acute postoperative pain 08/09/2016  . Anxiety   . Ascites   . Asthma   . Back pain   . Bile leak, postoperative 07/17/2014  . Brittle bone disease   . Cancer ([place])    Uteriine  ca 42yr ago partial hysterectomy  . Cervical disc disease   . Chronic kidney disease   . Collagen vascular disease ([facility]    RA  3-4 yrs ago  . COPD (chronic obstructive pulmonary disease) ([place]   . Diabetes mellitus without complication ([facility]   . GERD (gastroesophageal reflux disease)   . Hypertension   . Hypothyroidism   . Left upper quadrant pain 01/09/2014  . Major depressive disorder with single episode 12/05/2011  . Major depressive disorder, single episode 12/05/2011  . Migraines   . NASH (nonalcoholic steatohepatitis)   . Respiratory infection    2/17  . Shock ([place] 09/18/2014  . Sleep apnea   . Sleep apnea   . Syncope 11/16/2014  . Thyroid disease   . TIA (transient ischemic attack)      ADMITTING HISTORY  HISTORY OF PRESENT ILLNESS:  Lori Mckenzie is a 53y.o. female who presents with chief complaint as above.  Patient presents to the ED with a complaint of abdominal fullness and pain.  She has a history of  cirrhosis with ascites secondary to NASH.  [place] regular paracentesis for volume control of her ascites.  She states that she was in process of coordinating TIPS procedure.  However, she had to move here after her son and husband passed away.  She needs paracentesis for her abdominal ascites, and requests to be connected with GI to continue evaluating for TIPS procedure.  Hospitalist were called for admission   HOSPITAL COURSE:   53year old female patient with history of cirrhosis of the liver with ascites, diabetes mellitus type 2, obstructive sleep apnea, diastolic heart failure, essential hypertension, GERD came in because of abdominal pain  *Recurrent ascites secondary to cirrhosis. Patient due to her abdominal pain was started on IV ceftriaxone for possible SBP.  There was no significant fluid on ultrasound by radiology when paracentesis was attempted.  Antibiotics stopped.  Afebrile.  Normal WBC. [facility] and further treatment of her ascites. Seen by Dr. TAlice Reichertin the hospital. Patient is on nadolol, Aldactone and Bumex. Patient is on lactulose  *   Morbid obesity  *  Complains of left leg pain, ultrasound of her legs showed no DVT on left side. Chronic issue.  * portal hypertension, patient had EGD in 2018 by Dr. AVicente Maleswhich showed moderate  portal hypertensive gastropathy gastric fundus and body.    *  Adjustment disorder, patient lost her husband, son within gap of 2 weeks and she is crying. Psychiatry consulted.  Did not advise behavioral health unit admission.  Outpatient follow-up.  *COPD with no exacerbation.  Continue home inhalers  *Diabetes mellitus.  Sliding scale insulin.  Continue home medications *  History of depression, continue Celexa 40 mg daily. *  Chronic abdominal pain patient is on oxycodone 10 mg 6 times daily prn.  No change  Patient stable  for discharge home to follow-up with [facility] hematology.  CONSULTS OBTAINED:  Treatment Team:  Lori Covert, MD  DRUG ALLERGIES:   Allergies  Allergen Reactions  . Hydrocodone-Acetaminophen   . Nsaids Other (See Comments)    Liver problems Liver problems   . Tape Swelling  . Vicodin [Hydrocodone-Acetaminophen] Itching and Rash    DISCHARGE MEDICATIONS:   Allergies as of 07/30/2018      Reactions   Hydrocodone-acetaminophen    Nsaids Other (See Comments)   Liver problems Liver problems   Tape Swelling   Vicodin [hydrocodone-acetaminophen] Itching, Rash      Medication List    TAKE these medications   albuterol 108 (90 Base) MCG/ACT inhaler Commonly known as: VENTOLIN HFA Inhale 2 puffs into the lungs every 6 (six) hours as needed for wheezing or shortness of breath.   ALPRAZolam 1 MG tablet Commonly known as: XANAX Take 1 mg by mouth 3 (three) times daily.   bumetanide 1 MG tablet Commonly known as: BUMEX Take 3 mg by mouth 2 (two) times a day.   cholecalciferol 1000 units tablet Commonly known as: VITAMIN Lori Take 2,000 Units by mouth daily.   citalopram 40 MG tablet Commonly known as: CELEXA Take 40 mg by mouth daily.   diclofenac sodium 1 % Gel Commonly known as: VOLTAREN Apply 4 g topically every 8 (eight) hours as needed. What changed: reasons to take this   fluticasone 50 MCG/ACT nasal spray Commonly known as: FLONASE Place 1-2 sprays into both nostrils daily.   gabapentin 300 MG capsule Commonly known as: NEURONTIN Take 600 mg by mouth 4 (four) times daily.   insulin aspart 100 UNIT/ML injection Commonly known as: novoLOG Inject 44 Units into the skin 3 (three) times daily with meals.   insulin regular human CONCENTRATED 500 UNIT/ML injection Commonly known as: HUMULIN R Inject 100-200 Units into the skin 2 (two) times a day. Inject 200 units in the morning, inject 100 units nightly. Based on glucose readings   Intrinsi B12-Folate  800-500-20 MCG-MCG-MG Tabs Take 1 tablet by mouth daily.   ipratropium-albuterol 0.5-2.5 (3) MG/3ML Soln Commonly known as: DUONEB Take 3 mLs by nebulization every 4 (four) hours as needed. For shortness of breath/wheezing   lactulose 10 GM/15ML solution Commonly known as: CHRONULAC Take 30 mLs (20 g total) by mouth 3 (three) times daily. What changed:   how much to take  when to take this   nystatin powder Commonly known as: MYCOSTATIN/NYSTOP Apply 1 g topically 3 (three) times daily as needed. For irritation   Oxycodone HCl 10 MG Tabs Take 10 mg by mouth 6 (six) times daily.   pantoprazole 40 MG tablet Commonly known as: PROTONIX Take 40 mg by mouth 2 (two) times daily before a meal.   potassium chloride SA 20 MEQ tablet Commonly known as: K-DUR Take 20 mEq by mouth daily as needed. For high blood pressure   promethazine 12.5 MG tablet Commonly known  as: PHENERGAN Take 2 tablets (25 mg total) by mouth every 6 (six) hours as needed for nausea or vomiting. What changed: how much to take   rizatriptan 10 MG tablet Commonly known as: MAXALT Take 10 mg by mouth as needed for migraine.   spironolactone 100 MG tablet Commonly known as: ALDACTONE Take 100 mg by mouth daily.   vitamin B-12 1000 MCG tablet Commonly known as: CYANOCOBALAMIN Take 2,000 mcg by mouth daily.   Xifaxan 550 MG Tabs tablet Generic drug: rifaximin Take 550 mg by mouth daily.       Today   VITAL SIGNS:  Blood pressure 113/67, pulse 72, temperature 98.1 F (36.7 C), temperature source Oral, resp. rate 18, height 5' 2"  (1.575 m), weight 103.8 kg, SpO2 100 %.  I/O:    Intake/Output Summary (Last 24 hours) at 07/30/2018 1518 Last data filed at 07/30/2018 1030 Gross per 24 hour  Intake 120 ml  Output -  Net 120 ml    PHYSICAL EXAMINATION:  Physical Exam  GENERAL:  53 y.o.-year-old patient lying in the bed with no acute distress.  LUNGS: Normal breath sounds bilaterally, no wheezing,  rales,rhonchi or crepitation. No use of accessory muscles of respiration.  CARDIOVASCULAR: S1, S2 normal. No murmurs, rubs, or gallops.  ABDOMEN: Soft, non-tender, non-distended. Bowel sounds present. No organomegaly or mass.  NEUROLOGIC: Moves all 4 extremities. PSYCHIATRIC: The patient is alert and oriented x 3.  SKIN: No obvious rash, lesion, or ulcer.   DATA REVIEW:   CBC Recent Labs  Lab 07/29/18 0423  WBC 2.9*  HGB 12.8  HCT 36.7  PLT 83*    Chemistries  Recent Labs  Lab 07/28/18 1936 07/29/18 0423  NA 136 136  K 4.1 3.2*  CL 101 103  CO2 29 27  GLUCOSE 150* 138*  BUN 13 11  CREATININE 0.63 0.53  CALCIUM 8.3* 8.3*  AST 30  --   ALT 20  --   ALKPHOS 92  --   BILITOT 1.3*  --     Cardiac Enzymes No results for input(s): TROPONINI in the last 168 hours.  Microbiology Results  Results for orders placed or performed during the hospital encounter of 07/28/18  SARS Coronavirus 2 (CEPHEID - Performed in [facility] hospital lab), Hosp Order     Status: None   Collection Time: 07/28/18  9:28 PM   Specimen: Nasopharyngeal Swab  Result Value Ref Range Status   SARS Coronavirus 2 NEGATIVE NEGATIVE Final    Comment: (NOTE) If result is NEGATIVE SARS-CoV-2 target nucleic acids are NOT DETECTED. The SARS-CoV-2 RNA is generally detectable in upper and lower  respiratory specimens during the acute phase of infection. The lowest  concentration of SARS-CoV-2 viral copies this assay can detect is 250  copies / mL. A negative result does not preclude SARS-CoV-2 infection  and should not be used as the sole basis for treatment or other  patient management decisions.  A negative result may occur with  improper specimen collection / handling, submission of specimen other  than nasopharyngeal swab, presence of viral mutation(s) within the  areas targeted by this assay, and inadequate number of viral copies  (<250 copies / mL). A negative result must be combined with clinical   observations, patient history, and epidemiological information. If result is POSITIVE SARS-CoV-2 target nucleic acids are DETECTED. The SARS-CoV-2 RNA is generally detectable in upper and lower  respiratory specimens dur ing the acute phase of infection.  Positive  results are indicative  of active infection with SARS-CoV-2.  Clinical  correlation with patient history and other diagnostic information is  necessary to determine patient infection status.  Positive results do  not rule out bacterial infection or co-infection with other viruses. If result is PRESUMPTIVE POSTIVE SARS-CoV-2 nucleic acids MAY BE PRESENT.   A presumptive positive result was obtained on the submitted specimen  and confirmed on repeat testing.  While 2019 novel coronavirus  (SARS-CoV-2) nucleic acids may be present in the submitted sample  additional confirmatory testing may be necessary for epidemiological  and / or clinical management purposes  to differentiate between  SARS-CoV-2 and other Sarbecovirus currently known to infect humans.  If clinically indicated additional testing with an alternate test  methodology [phone]) is advised. The SARS-CoV-2 RNA is generally  detectable in upper and lower respiratory sp ecimens during the acute  phase of infection. The expected result is Negative. Fact Sheet for Patients:  StrictlyIdeas.no Fact Sheet for Healthcare Providers: BankingDealers.co.za This test is not yet approved or cleared by the [place] FDA and has been authorized for detection and/or diagnosis of SARS-CoV-2 by FDA under an Emergency Use Authorization (EUA).  This EUA will remain in effect (meaning this test can be used) for the duration of the COVID-19 declaration under Section 564(b)(1) of the Act, 21 U.S.C. section 360bbb-3(b)(1), unless the authorization is terminated or revoked sooner. Performed at [facility], [address] [place], [place] [address]   MRSA PCR Screening     Status: Abnormal   Collection Time: 07/29/18  2:35 PM   Specimen: Nasopharyngeal  Result Value Ref Range Status   MRSA by PCR POSITIVE (A) NEGATIVE Final    Comment:        The GeneXpert MRSA Assay (FDA approved for NASAL specimens only), is one component of a comprehensive MRSA colonization surveillance program. It is not intended to diagnose MRSA infection nor to guide or monitor treatment for MRSA infections. RESULT CALLED TO, READ BACK BY AND VERIFIED WITH: JANCY JOHNSTONE @1552  07/29/18 AKT Performed at [facility], [address] [place], [place] [id number]     RADIOLOGY:  US Abdomen Limited  Result Date: 07/30/2018 CLINICAL DATA:  Abdominal distension, ascites, cirrhosis, assess for therapeutic paracentesis EXAM: LIMITED ABDOMEN ULTRASOUND FOR ASCITES TECHNIQUE: Limited ultrasound survey for ascites was performed in all four abdominal quadrants. COMPARISON:  07/24/2018 FINDINGS: Survey of the abdominal 4 quadrants demonstrates a small amount of abdominopelvic ascites in the right abdomen. Volume is not enough to warrant therapeutic paracentesis. Procedure not performed. IMPRESSION: Small amount of right abdominopelvic ascites. Electronically Signed   By: Jerilynn Mages.  Shick M.Lori.   On: 07/30/2018 09:58   US Venous Img Lower Unilateral Left  Result Date: 07/28/2018 CLINICAL DATA:  Pain EXAM: LEFT LOWER EXTREMITY VENOUS DOPPLER ULTRASOUND TECHNIQUE: Gray-scale sonography with graded compression, as well as color Doppler and duplex ultrasound were performed to evaluate the lower extremity deep venous systems from the level of the common femoral vein and including the common femoral, femoral, profunda femoral, popliteal and calf veins including the posterior tibial, peroneal and gastrocnemius veins when visible. The superficial great saphenous vein was also interrogated. Spectral Doppler was utilized to evaluate flow at rest and with  distal augmentation maneuvers in the common femoral, femoral and popliteal veins. COMPARISON:  None. FINDINGS: Contralateral Common Femoral Vein: Respiratory phasicity is normal and symmetric with the symptomatic side. No evidence of thrombus. Normal compressibility. Common Femoral Vein: No evidence of thrombus. Normal compressibility, respiratory phasicity  and response to augmentation. Saphenofemoral Junction: No evidence of thrombus. Normal compressibility and flow on color Doppler imaging. Profunda Femoral Vein: No evidence of thrombus. Normal compressibility and flow on color Doppler imaging. Femoral Vein: No evidence of thrombus. Normal compressibility, respiratory phasicity and response to augmentation. Popliteal Vein: No evidence of thrombus. Normal compressibility, respiratory phasicity and response to augmentation. Calf Veins: No evidence of thrombus. Normal compressibility and flow on color Doppler imaging. Superficial Great Saphenous Vein: No evidence of thrombus. Normal compressibility. Venous Reflux:  None. Other Findings:  None. IMPRESSION: No evidence of deep venous thrombosis. Electronically Signed   By: Constance Holster M.Lori.   On: 07/28/2018 21:27    Follow up with PCP in 1 week.  Management plans discussed with the patient, family and they are in agreement.  CODE STATUS:     Code Status Orders  (From admission, onward)         Start     Ordered   07/28/18 2341  Full code  Continuous     07/28/18 2340        Code Status History    Date Active Date Inactive Code Status Order ID Comments User Context   06/11/2017 2157 06/13/2017 1914 DNR [id number]  Oswald Hillock, MD ED   12/20/2016 2227 12/22/2016 1808 Full Code [id number]  Lori Coon, MD Inpatient   10/21/2016 1929 10/26/2016 1824 Full Code [id number]  Epifanio Lesches, MD ED   09/23/2016 1915 09/25/2016 1746 Full Code [id number]  Demetrios Loll, MD Inpatient   06/28/2016 2122 06/30/2016 1909 Full Code [id number]  Henreitta Leber, MD Inpatient   03/12/2015 0009 03/13/2015 1723 Full Code [id number]  Gladstone Lighter, MD Inpatient   10/04/2014 1259 10/06/2014 2020 Full Code [id number]  Baxter Hire, MD Inpatient   09/05/2014 0229 09/08/2014 1810 Full Code [id number]  Lori Coon, MD Inpatient   08/25/2014 1823 08/30/2014 1348 Full Code [id number]  Florene Glen, MD ED   08/03/2014 1712 08/08/2014 1516 Full Code [id number]  Marlyce Huge, MD ED   07/24/2014 1845 07/30/2014 1731 Full Code [id number]  Florene Glen, MD Inpatient   07/23/2014 1142 07/24/2014 0327 Full Code [id number]  Sabino Dick, MD HOV   07/12/2014 2325 07/16/2014 1630 Full Code [id number]  Lori Coon, MD Inpatient   Advance Care Planning Activity      TOTAL TIME TAKING CARE OF THIS PATIENT ON DAY OF DISCHARGE: more than 30 minutes.   Leia Alf Goldia Ligman M.Lori on 07/30/2018 at 3:18 PM  Between 7am to 6pm - Pager - [phone]  After 6pm go to [email or web address] - password [email or web address] [facility]  Office  [phone]  CC: Primary care physician; Ashkin, Neldon Labella, MD  Note: This dictation was prepared with Dragon dictation along with smaller phrase technology. Any transcriptional errors that result from this process are unintentional.

## 2018-07-30 NOTE — Progress Notes (Signed)
MD notified: The patient has returned from a paracentices do you want to provide and diet order since she is NPO or would you like to keep her NPO for now

## 2018-07-30 NOTE — Plan of Care (Signed)

## 2018-07-30 NOTE — Discharge Instructions (Signed)
Please follow with [facility] hepatology per schedule on Aug 6.

## 2018-07-31 LAB — HIV ANTIBODY (ROUTINE TESTING W REFLEX): HIV Screen 4th Generation wRfx: NONREACTIVE

## 2018-08-17 ENCOUNTER — Emergency Department
Admission: EM | Admit: 2018-08-17 | Discharge: 2018-08-17 | Disposition: A | Discharge status: Home / Self Care | Payer: Medicaid - Out of State | Attending: Student | Admitting: Student

## 2018-08-17 ENCOUNTER — Emergency Department: Payer: Medicaid - Out of State

## 2018-08-17 ENCOUNTER — Other Ambulatory Visit: Payer: Self-pay

## 2018-08-17 DIAGNOSIS — Z794 Long term (current) use of insulin: Secondary | ICD-10-CM | POA: Diagnosis not present

## 2018-08-17 DIAGNOSIS — I5032 Chronic diastolic (congestive) heart failure: Secondary | ICD-10-CM | POA: Insufficient documentation

## 2018-08-17 DIAGNOSIS — E039 Hypothyroidism, unspecified: Secondary | ICD-10-CM | POA: Diagnosis not present

## 2018-08-17 DIAGNOSIS — J45909 Unspecified asthma, uncomplicated: Secondary | ICD-10-CM | POA: Diagnosis not present

## 2018-08-17 DIAGNOSIS — J449 Chronic obstructive pulmonary disease, unspecified: Secondary | ICD-10-CM | POA: Insufficient documentation

## 2018-08-17 DIAGNOSIS — R0602 Shortness of breath: Secondary | ICD-10-CM | POA: Insufficient documentation

## 2018-08-17 DIAGNOSIS — E119 Type 2 diabetes mellitus without complications: Secondary | ICD-10-CM | POA: Insufficient documentation

## 2018-08-17 DIAGNOSIS — I110 Hypertensive heart disease with heart failure: Secondary | ICD-10-CM | POA: Diagnosis not present

## 2018-08-17 DIAGNOSIS — R140 Abdominal distension (gaseous): Secondary | ICD-10-CM | POA: Diagnosis present

## 2018-08-17 DIAGNOSIS — R188 Other ascites: Secondary | ICD-10-CM | POA: Diagnosis not present

## 2018-08-17 LAB — COMPREHENSIVE METABOLIC PANEL
ALT: 20 U/L (ref 0–44)
AST: 33 U/L (ref 15–41)
Albumin: 2.7 g/dL — ABNORMAL LOW (ref 3.5–5.0)
Alkaline Phosphatase: 85 U/L (ref 38–126)
Anion gap: 9 (ref 5–15)
BUN: 9 mg/dL (ref 6–20)
CO2: 26 mmol/L (ref 22–32)
Calcium: 7.9 mg/dL — ABNORMAL LOW (ref 8.9–10.3)
Chloride: 98 mmol/L (ref 98–111)
Creatinine, Ser: 0.65 mg/dL (ref 0.44–1.00)
GFR calc Af Amer: >60 mL/min (ref >60)
GFR calc non Af Amer: >60 mL/min (ref >60)
Glucose, Bld: 203 mg/dL — ABNORMAL HIGH (ref 70–99)
Potassium: 2.9 mmol/L — ABNORMAL LOW (ref 3.5–5.1)
Sodium: 133 mmol/L — ABNORMAL LOW (ref 135–145)
Total Bilirubin: 1.3 mg/dL — ABNORMAL HIGH (ref 0.3–1.2)
Total Protein: 5.7 g/dL — ABNORMAL LOW (ref 6.5–8.1)

## 2018-08-17 LAB — CBC
HCT: 38 % (ref 36.0–46.0)
Hemoglobin: 13.2 g/dL (ref 12.0–15.0)
MCH: 33.2 pg (ref 26.0–34.0)
MCHC: 34.7 g/dL (ref 30.0–36.0)
MCV: 95.5 fL (ref 80.0–100.0)
Platelets: 97 10*3/uL — ABNORMAL LOW (ref 150–400)
RBC: 3.98 MIL/uL (ref 3.87–5.11)
RDW: 13.4 % (ref 11.5–15.5)
WBC: 4.7 10*3/uL (ref 4.0–10.5)
nRBC: 0 % (ref 0.0–0.2)

## 2018-08-17 LAB — PROTIME-INR
INR: 1.2 (ref 0.8–1.2)
Prothrombin Time: 15.4 seconds — ABNORMAL HIGH (ref 11.4–15.2)

## 2018-08-17 LAB — LIPASE, BLOOD: Lipase: 27 U/L (ref 11–51)

## 2018-08-17 LAB — AMMONIA: Ammonia: 28 umol/L (ref 9–35)

## 2018-08-17 MED ORDER — ONDANSETRON HCL 4 MG/2ML IJ SOLN
4.0000 mg | Freq: Once | INTRAMUSCULAR | Status: AC
  Administered 2018-08-17: 4 mg via INTRAVENOUS
  Filled 2018-08-17: qty 2

## 2018-08-17 MED ORDER — PANTOPRAZOLE SODIUM 40 MG PO TBEC
40.0000 mg | DELAYED_RELEASE_TABLET | Freq: Once | ORAL | Status: AC
  Administered 2018-08-17: 40 mg via ORAL
  Filled 2018-08-17: qty 1

## 2018-08-17 MED ORDER — MORPHINE SULFATE (PF) 4 MG/ML IV SOLN
4.0000 mg | Freq: Once | INTRAVENOUS | Status: AC
  Administered 2018-08-17: 17:00:00 4 mg via INTRAVENOUS
  Filled 2018-08-17: qty 1

## 2018-08-17 MED ORDER — ALBUMIN HUMAN 25 % IV SOLN
25.0000 g | Freq: Once | INTRAVENOUS | Status: AC
  Administered 2018-08-17: 25 g via INTRAVENOUS
  Filled 2018-08-17: qty 100

## 2018-08-17 MED ORDER — ALUM & MAG HYDROXIDE-SIMETH 200-200-20 MG/5ML PO SUSP
30.0000 mL | Freq: Once | ORAL | Status: AC
  Administered 2018-08-17: 30 mL via ORAL
  Filled 2018-08-17: qty 30

## 2018-08-17 MED ORDER — FENTANYL CITRATE (PF) 100 MCG/2ML IJ SOLN
50.0000 ug | Freq: Once | INTRAMUSCULAR | Status: AC
  Administered 2018-08-17: 50 ug via INTRAVENOUS
  Filled 2018-08-17: qty 2

## 2018-08-17 NOTE — Discharge Instructions (Addendum)
Thank you for letting us take care of you in the emergency department today.   Please continue to take your regular, prescribed medications.   Please follow up with: - Your liver doctor on Monday at your scheduled visit - have them recheck your potassium levels at this visit because they were slightly low today  Please return to the ER for any new or worsening symptoms.

## 2018-08-17 NOTE — ED Provider Notes (Signed)
[facility] Emergency Department Provider Note  ____________________________________________   First MD Initiated Contact with Patient 08/17/18 1505     (approximate)  I have reviewed the triage vital signs and the nursing notes.  History  Chief Complaint Abdominal Pain and Shortness of Breath    HPI Lori Mckenzie is a 53 y.o. female with a history of NASH cirrhosis, significant ascites requiring frequent large-volume paracentesis (most recently on 8/10 at [facility]) who presents to the emergency department for recurrence of her ascites.  She reports associated discomfort due to her abdominal swelling as well as some associated shortness of breath. Discomfort currently moderate in severity. Described as swelling/tightness sensation. She denies any fevers, cough, chest pain, vomiting, or changes to her bowel movements.  Patient states she has large-volume paracentesis at least once a week, sometimes several times a week.  She attempted to make an appointment at her [facility] clinic today, however all of the slots were filled, and therefore she presents emergency department today. She had 8 L removed on 8/10.          Past Medical Hx Past Medical History:  Diagnosis Date  . Abdominal abscess 08/25/2014  . Acid reflux 08/10/2010  . Acute cervical myofascial strain 02/09/2016  . Acute postoperative pain 08/09/2016  . Anxiety   . Ascites   . Asthma   . Back pain   . Bile leak, postoperative 07/17/2014  . Brittle bone disease   . Cancer ([place])    Uteriine  ca 69yr ago partial hysterectomy  . Cervical disc disease   . Chronic kidney disease   . Collagen vascular disease ([place]    RA  3-4 yrs ago  . COPD (chronic obstructive pulmonary disease) ([place]   . Diabetes mellitus without complication ([place]   . GERD (gastroesophageal reflux disease)   . Hypertension   . Hypothyroidism   . Left upper quadrant pain 01/09/2014  . Major depressive disorder with single episode 12/05/2011   . Major depressive disorder, single episode 12/05/2011  . Migraines   . NASH (nonalcoholic steatohepatitis)   . Respiratory infection    2/17  . Shock ([place] 09/18/2014  . Sleep apnea   . Sleep apnea   . Syncope 11/16/2014  . Thyroid disease   . TIA (transient ischemic attack)     Problem List Patient Active Problem List   Diagnosis Date Noted  . UTI (urinary tract infection) 06/11/2017  . Neurogenic pain 02/13/2017  . Chest pain 12/20/2016  . Chronic feet pain (Secondary source of pain) (Bilateral) (R>L) 10/26/2016  . Acute hepatic encephalopathy 09/23/2016  . Low back pain due to L1-2 disc extrusion (caudal) (Left) 07/11/2016  . Abdominal pain 06/28/2016  . Constipation 06/20/2016  . Hyperglycemia 06/20/2016  . Nausea without vomiting 06/06/2016  . Chronic lower extremity pain (Bilateral) 05/19/2016  . Spinal stenosis, thoracic region (T10-11) 05/19/2016  . Diabetes mellitus, insulin dependent (IDDM), uncontrolled ([place] 04/12/2016  . Chronic sacroiliac joint pain (Bilateral) (L>R) 03/23/2016  . Chronic upper extremity pain (Left) 03/10/2016  . Chronic radicular cervical pain (L) 03/10/2016  . Osteoarthritis 02/24/2016  . Allodynia 02/09/2016  . Chronic pain syndrome 01/07/2016  . Opiate withdrawal ([place] 12/22/2015  . Elevated liver enzymes 09/28/2015  . Cirrhosis of liver with ascites ([place] 09/24/2015  . Cirrhosis ([place] 09/24/2015  . Altered mental status 07/24/2015  . Uncontrolled diabetes mellitus ([place] 07/24/2015  . Radicular pain of thoracic region 04/06/2015  . Hepatic encephalopathy ([place] 03/11/2015  . Elevated sedimentation rate  03/05/2015  . Elevated C-reactive protein (CRP) 03/05/2015  . Lumbar facet syndrome (Bilateral) (R>L) 03/05/2015  . Cervical spondylosis 03/05/2015  . Lumbar spondylosis 03/05/2015  . Encounter for chronic pain management 03/05/2015  . Chronic shoulder pain (Bilateral) (R>L) 03/05/2015  . Chronic carpal tunnel syndrome (Bilateral)  03/05/2015  . Chronic hip pain (Bilateral) (L>R) 03/05/2015  . Chronic upper back pain (Bilateral) (L>R) 03/05/2015  . Osteoporosis, idiopathic 03/05/2015  . Abnormal MRI, lumbar spine (02/03/2015) 03/05/2015  . Thoracic radiculitis 02/05/2015  . Vitamin D deficiency 01/13/2015  . B12 deficiency 01/13/2015  . Folate deficiency 01/13/2015  . Subacute lumbar radiculopathy (left side) (S1 dermatome) 12/10/2014  . Drowsiness 11/16/2014  . Episode of syncope 11/16/2014  . Somnolence 11/16/2014  . Opiate use (75 MME/Day) 10/28/2014  . Long term prescription opiate use 10/28/2014  . Long term current use of opiate analgesic 10/28/2014  . Encounter for therapeutic drug level monitoring 10/28/2014  . Chronic epigastric abdominal pain 10/28/2014  . Chronic low back pain (Primary Source of Pain) (Bilateral) (R>L) 10/28/2014  . Chronic neck pain [facility] source of pain) (Bilateral) (R>L) 10/28/2014  . Ascites 09/05/2014  . NASH (nonalcoholic steatohepatitis) 09/05/2014  . Hypokalemia 09/05/2014  . Dysthymia 08/05/2014  . Other social stressor 08/05/2014  . Steatohepatitis 07/15/2014  . Type 2 diabetes mellitus ([place]) 07/12/2014  . COPD with acute exacerbation ([place]) 07/12/2014  . GERD (gastroesophageal reflux disease) 07/12/2014  . OSA on CPAP 07/12/2014  . Anxiety 07/12/2014  . 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 Lumbar and sacral osteoarthritis 03/26/2014  . Myofascial pain 03/26/2014  . Lumbar spinal stenosis 03/26/2014  . Lumbosacral spondylosis without myelopathy 03/26/2014  . 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 Lumbar central spinal stenosis (L1-2 and L4-5) 03/26/2014  . Spondylosis of lumbar region without myelopathy or radiculopathy 03/26/2014  . Breath shortness 01/09/2014  . Diastolic dysfunction with chronic heart failure ([place]) 08/06/2013  . Airway hyperreactivity 08/06/2013  . Essential (primary) hypertension 08/06/2013  . Asthma 08/06/2013  .  Clinical depression 12/05/2011    Past Surgical Hx Past Surgical History:  Procedure Laterality Date  . ABDOMINAL HYSTERECTOMY    . CHOLECYSTECTOMY N/A 07/15/2014   Procedure: LAPAROSCOPIC CHOLECYSTECTOMY with liver biopsy ;  Surgeon: Sherri Rad, MD;  Location: [facility] ORS;  Service: General;  Laterality: N/A;  . COLONOSCOPY WITH PROPOFOL N/A 06/23/2014   Procedure: COLONOSCOPY WITH PROPOFOL;  Surgeon: Lollie Sails, MD;  Location: [facility] ENDOSCOPY;  Service: Endoscopy;  Laterality: N/A;  . ERCP N/A 07/16/2014   Procedure: ENDOSCOPIC RETROGRADE CHOLANGIOPANCREATOGRAPHY (ERCP);  Surgeon: Clarene Essex, MD;  Location: [facility] ENDOSCOPY;  Service: Endoscopy;  Laterality: N/A;  . ERCP N/A 10/03/2014   Procedure: ENDOSCOPIC RETROGRADE CHOLANGIOPANCREATOGRAPHY (ERCP);  Surgeon: Hulen Luster, MD;  Location: [facility] ENDOSCOPY;  Service: Gastroenterology;  Laterality: N/A;  . ESOPHAGOGASTRODUODENOSCOPY N/A 06/23/2014   Procedure: ESOPHAGOGASTRODUODENOSCOPY (EGD);  Surgeon: Lollie Sails, MD;  Location: [facility] ENDOSCOPY;  Service: Endoscopy;  Laterality: N/A;  . ESOPHAGOGASTRODUODENOSCOPY N/A 06/30/2016   Procedure: ESOPHAGOGASTRODUODENOSCOPY (EGD);  Surgeon: Jonathon Bellows, MD;  Location: [facility] ENDOSCOPY;  Service: Endoscopy;  Laterality: N/A;  . ESOPHAGOGASTRODUODENOSCOPY (EGD) WITH PROPOFOL N/A 12/01/2015   Procedure: ESOPHAGOGASTRODUODENOSCOPY (EGD) WITH PROPOFOL;  Surgeon: Lollie Sails, MD;  Location: [facility] ENDOSCOPY;  Service: Endoscopy;  Laterality: N/A;  . TUBAL LIGATION    . WISDOM TOOTH EXTRACTION      Medications Prior to Admission medications   Medication Sig Start Date End Date Taking? Authorizing Provider  albuterol (PROVENTIL HFA;VENTOLIN  HFA) 108 (90 Base) MCG/ACT inhaler Inhale 2 puffs into the lungs every 6 (six) hours as needed for wheezing or shortness of breath.    [provider]  ALPRAZolam Duanne Moron) 1 MG tablet Take 1 mg by mouth 3 (three) times daily.    [provider]   bumetanide (BUMEX) 1 MG tablet Take 3 mg by mouth 2 (two) times a day. 07/28/18 08/27/18  [provider]  cholecalciferol (VITAMIN D) 1000 units tablet Take 2,000 Units by mouth daily.    [provider]  citalopram (CELEXA) 40 MG tablet Take 40 mg by mouth daily.  07/24/15   [provider]  diclofenac sodium (VOLTAREN) 1 % GEL Apply 4 g topically every 8 (eight) hours as needed. Patient taking differently: Apply 4 g topically every 8 (eight) hours as needed (FOR PAIN).  12/06/16 07/29/18  Milinda Pointer, MD  fluticasone (FLONASE) 50 MCG/ACT nasal spray Place 1-2 sprays into both nostrils daily.     [provider]  Folate-B12-Intrinsic Factor (INTRINSI B12-FOLATE) 707-867-54 MCG-MCG-MG TABS Take 1 tablet by mouth daily.     [provider]  gabapentin (NEURONTIN) 300 MG capsule Take 600 mg by mouth 4 (four) times daily.     [provider]  insulin aspart (NOVOLOG) 100 UNIT/ML injection Inject 44 Units into the skin 3 (three) times daily with meals.    [provider]  insulin regular human CONCENTRATED (HUMULIN R) 500 UNIT/ML injection Inject 100-200 Units into the skin 2 (two) times a day. Inject 200 units in the morning, inject 100 units nightly. Based on glucose readings    [provider]  ipratropium-albuterol (DUONEB) 0.5-2.5 (3) MG/3ML SOLN Take 3 mLs by nebulization every 4 (four) hours as needed. For shortness of breath/wheezing    [provider]  lactulose (CHRONULAC) 10 GM/15ML solution Take 30 mLs (20 g total) by mouth 3 (three) times daily. Patient taking differently: Take 40 g by mouth 4 (four) times daily.  10/26/16   Nicholes Mango, MD  nystatin (MYCOSTATIN/NYSTOP) powder Apply 1 g topically 3 (three) times daily as needed. For irritation 10/26/16   Gouru, Illene Silver, MD  Oxycodone HCl 10 MG TABS Take 10 mg by mouth 6 (six) times daily.    [provider]  pantoprazole (PROTONIX) 40 MG tablet Take  40 mg by mouth 2 (two) times daily before a meal.    [provider]  potassium chloride SA (K-DUR,KLOR-CON) 20 MEQ tablet Take 20 mEq by mouth daily as needed. For high blood pressure    [provider]  promethazine (PHENERGAN) 12.5 MG tablet Take 2 tablets (25 mg total) by mouth every 6 (six) hours as needed for nausea or vomiting. Patient taking differently: Take 12.5-25 mg by mouth every 6 (six) hours as needed for nausea or vomiting.  09/25/16   Dustin Flock, MD  rifaximin (XIFAXAN) 550 MG TABS tablet Take 550 mg by mouth daily.     [provider]  rizatriptan (MAXALT) 10 MG tablet Take 10 mg by mouth as needed for migraine.     [provider]  spironolactone (ALDACTONE) 100 MG tablet Take 100 mg by mouth daily.     [provider]  vitamin B-12 (CYANOCOBALAMIN) 1000 MCG tablet Take 2,000 mcg by mouth daily.    [provider]  SUMAtriptan (IMITREX) 50 MG tablet Take 50 mg by mouth every 2 (two) hours as needed. For migraines  03/17/11  [provider]    Allergies Hydrocodone-acetaminophen, Nsaids,  Tape, and Vicodin [hydrocodone-acetaminophen]  Family Hx Family History  Problem Relation Age of Onset  . Lung cancer Mother   . Ulcers Father   . Emphysema Father   . Heart disease Sister   . Ulcers Sister   . Heart disease Brother     Social Hx Social History   Tobacco Use  . Smoking status: Never Smoker  . Smokeless tobacco: Never Used  Substance Use Topics  . Alcohol use: No    Comment: occ  . Drug use: No     Review of Systems  Constitutional: Negative for fever. Negative for chills. Eyes: Negative for visual changes. ENT: Negative for sore throat. Cardiovascular: Negative for chest pain. Respiratory: + for shortness of breath. Gastrointestinal: + for abdominal pain. + for nausea. Negative for vomiting. Genitourinary: Negative for dysuria. Musculoskeletal: Negative for leg swelling. Skin: Negative  for rash. Neurological: Negative for for headaches.   Physical Exam  Vital Signs: ED Triage Vitals [08/17/18 1157]  Enc Vitals Group     BP 127/79     Pulse Rate (!) 112     Resp (!) 22     Temp 99.2 F (37.3 C)     Temp Source Oral     SpO2 100 %     Weight 235 lb (106.6 kg)     Height 5' 2"  (1.575 m)     Head Circumference      Peak Flow      Pain Score 9     Pain Loc      Pain Edu?      Excl. in [place]?     Constitutional: Alert and oriented.  Eyes: Conjunctivae clear. Sclera anicteric. Head: Normocephalic. Atraumatic. Nose: No congestion. No rhinorrhea. Mouth/Throat: Mucous membranes are moist.  Neck: No stridor.   Cardiovascular: Mildly tachycardic, regular rhythm. No murmurs. Extremities well perfused. Respiratory: Normal respiratory effort.  Lungs clear with small scattered wheezing.  Gastrointestinal: Somewhat distended 2/2 ascites. Mild bilateral mid abdominal discomfort, no rebound or guarding, no focal localizing tenderness.   Musculoskeletal: No lower extremity edema. Neurologic:  Normal speech and language. No gross focal neurologic deficits are appreciated.  Skin: Skin is warm, dry and intact. No rash noted. Psychiatric: Mood and affect are appropriate for situation.  EKG  Personally reviewed.   Rate: tachycardic, 113 Rhythm: sinus Axis: rightward Intervals: within normal limits No acute ischemic changes   Radiology  XR: IMPRESSION: 1. Cardiomegaly with central vascular congestion. 2. Probable small left effusion with left basilar atelectasis or infiltrate.     Procedures  Procedure(s) performed (including critical care):  Procedures   Initial Impression / Assessment and Plan / ED Course  53 y.o. female with history of NASH cirrhosis, with significant ascites requirement frequent large volume paracentesis who presents to the ED with abdominal distention and discomfort related to recurrence of her ascites.  Last had large volume at [facility]  on 8/10, with 8 L removed.  On exam, she is in no acute distress.  Mildly tachycardic, vitals otherwise normal.  No fever.  No focal localizing abdominal tenderness.  Suspect her symptoms are related to recurrence of her known ascites related to her cirrhosis.  No significant abdominal pain on exam, no fever, no leukocytosis, do not suspect SBP at this time.  Will discuss with IR for possibility of large-volume paracentesis here.  5:16 PM Patient successfully underwent IR drainage, 4.5 L taken off.  Her abdominal exam is improved, significantly less distended, soft, and nontender.  She reports  subjective improvement in her symptoms as well, tachycardia resolved.  Will administer albumin, and then plan for discharge.  She has an appointment on Monday for follow-up, also advised recheck of her potassium at this visit as well. Patient agreeable with plan.     Final Clinical Impression(s) / ED Diagnosis  Final diagnoses:  Ascites       Note:  This document was prepared using Dragon voice recognition software and may include unintentional dictation errors.   Lilia Pro., MD 08/17/18 1719

## 2018-08-17 NOTE — ED Notes (Signed)
X-ray at bedside

## 2018-08-17 NOTE — ED Notes (Signed)
Patient transported to Ultrasound 

## 2018-08-17 NOTE — ED Triage Notes (Signed)
C/o abdominal distension, pressure and SOB that has been gradually worsening over last few days. Pt gets paracentesis' weekly, last one was 8/10. Pt has exertional SOB.

## 2018-08-19 ENCOUNTER — Other Ambulatory Visit: Payer: Self-pay

## 2018-08-19 ENCOUNTER — Encounter: Payer: Self-pay | Admitting: *Deleted

## 2018-08-19 ENCOUNTER — Observation Stay
Admission: EM | Admit: 2018-08-19 | Discharge: 2018-08-20 | Disposition: A | Discharge status: Home / Self Care | Payer: Medicaid - Out of State | Attending: Internal Medicine | Admitting: Internal Medicine

## 2018-08-19 ENCOUNTER — Emergency Department: Payer: Medicaid - Out of State

## 2018-08-19 DIAGNOSIS — Z79899 Other long term (current) drug therapy: Secondary | ICD-10-CM | POA: Diagnosis not present

## 2018-08-19 DIAGNOSIS — E1122 Type 2 diabetes mellitus with diabetic chronic kidney disease: Secondary | ICD-10-CM | POA: Diagnosis not present

## 2018-08-19 DIAGNOSIS — K7460 Unspecified cirrhosis of liver: Secondary | ICD-10-CM | POA: Insufficient documentation

## 2018-08-19 DIAGNOSIS — E871 Hypo-osmolality and hyponatremia: Secondary | ICD-10-CM | POA: Diagnosis not present

## 2018-08-19 DIAGNOSIS — R109 Unspecified abdominal pain: Secondary | ICD-10-CM | POA: Insufficient documentation

## 2018-08-19 DIAGNOSIS — D696 Thrombocytopenia, unspecified: Secondary | ICD-10-CM | POA: Diagnosis not present

## 2018-08-19 DIAGNOSIS — F4321 Adjustment disorder with depressed mood: Secondary | ICD-10-CM | POA: Diagnosis not present

## 2018-08-19 DIAGNOSIS — K7581 Nonalcoholic steatohepatitis (NASH): Secondary | ICD-10-CM | POA: Insufficient documentation

## 2018-08-19 DIAGNOSIS — Z8673 Personal history of transient ischemic attack (TIA), and cerebral infarction without residual deficits: Secondary | ICD-10-CM | POA: Insufficient documentation

## 2018-08-19 DIAGNOSIS — Z20828 Contact with and (suspected) exposure to other viral communicable diseases: Secondary | ICD-10-CM | POA: Insufficient documentation

## 2018-08-19 DIAGNOSIS — I129 Hypertensive chronic kidney disease with stage 1 through stage 4 chronic kidney disease, or unspecified chronic kidney disease: Secondary | ICD-10-CM | POA: Diagnosis not present

## 2018-08-19 DIAGNOSIS — F419 Anxiety disorder, unspecified: Secondary | ICD-10-CM | POA: Diagnosis not present

## 2018-08-19 DIAGNOSIS — G4733 Obstructive sleep apnea (adult) (pediatric): Secondary | ICD-10-CM | POA: Insufficient documentation

## 2018-08-19 DIAGNOSIS — Z885 Allergy status to narcotic agent status: Secondary | ICD-10-CM | POA: Insufficient documentation

## 2018-08-19 DIAGNOSIS — K219 Gastro-esophageal reflux disease without esophagitis: Secondary | ICD-10-CM | POA: Diagnosis not present

## 2018-08-19 DIAGNOSIS — Z79891 Long term (current) use of opiate analgesic: Secondary | ICD-10-CM | POA: Insufficient documentation

## 2018-08-19 DIAGNOSIS — G894 Chronic pain syndrome: Secondary | ICD-10-CM | POA: Insufficient documentation

## 2018-08-19 DIAGNOSIS — R0602 Shortness of breath: Secondary | ICD-10-CM | POA: Insufficient documentation

## 2018-08-19 DIAGNOSIS — E039 Hypothyroidism, unspecified: Secondary | ICD-10-CM | POA: Diagnosis not present

## 2018-08-19 DIAGNOSIS — Z794 Long term (current) use of insulin: Secondary | ICD-10-CM | POA: Insufficient documentation

## 2018-08-19 DIAGNOSIS — J90 Pleural effusion, not elsewhere classified: Secondary | ICD-10-CM | POA: Insufficient documentation

## 2018-08-19 DIAGNOSIS — J449 Chronic obstructive pulmonary disease, unspecified: Secondary | ICD-10-CM | POA: Insufficient documentation

## 2018-08-19 DIAGNOSIS — R188 Other ascites: Secondary | ICD-10-CM | POA: Diagnosis present

## 2018-08-19 DIAGNOSIS — Z886 Allergy status to analgesic agent status: Secondary | ICD-10-CM | POA: Insufficient documentation

## 2018-08-19 DIAGNOSIS — Z888 Allergy status to other drugs, medicaments and biological substances status: Secondary | ICD-10-CM | POA: Insufficient documentation

## 2018-08-19 DIAGNOSIS — E876 Hypokalemia: Secondary | ICD-10-CM | POA: Diagnosis present

## 2018-08-19 DIAGNOSIS — Z634 Disappearance and death of family member: Secondary | ICD-10-CM | POA: Diagnosis not present

## 2018-08-19 DIAGNOSIS — N189 Chronic kidney disease, unspecified: Secondary | ICD-10-CM | POA: Insufficient documentation

## 2018-08-19 LAB — CBC WITH DIFFERENTIAL/PLATELET
Abs Immature Granulocytes: 0.02 10*3/uL (ref 0.00–0.07)
Basophils Absolute: 0 10*3/uL (ref 0.0–0.1)
Basophils Relative: 1 %
Eosinophils Absolute: 0 10*3/uL (ref 0.0–0.5)
Eosinophils Relative: 1 %
HCT: 35.8 % — ABNORMAL LOW (ref 36.0–46.0)
Hemoglobin: 12.7 g/dL (ref 12.0–15.0)
Immature Granulocytes: 0 %
Lymphocytes Relative: 8 %
Lymphs Abs: 0.5 10*3/uL — ABNORMAL LOW (ref 0.7–4.0)
MCH: 33.4 pg (ref 26.0–34.0)
MCHC: 35.5 g/dL (ref 30.0–36.0)
MCV: 94.2 fL (ref 80.0–100.0)
Monocytes Absolute: 0.5 10*3/uL (ref 0.1–1.0)
Monocytes Relative: 8 %
Neutro Abs: 4.7 10*3/uL (ref 1.7–7.7)
Neutrophils Relative %: 82 %
Platelets: 97 10*3/uL — ABNORMAL LOW (ref 150–400)
RBC: 3.8 MIL/uL — ABNORMAL LOW (ref 3.87–5.11)
RDW: 13.3 % (ref 11.5–15.5)
WBC: 5.7 10*3/uL (ref 4.0–10.5)
nRBC: 0 % (ref 0.0–0.2)

## 2018-08-19 LAB — COMPREHENSIVE METABOLIC PANEL
ALT: 21 U/L (ref 0–44)
AST: 36 U/L (ref 15–41)
Albumin: 2.8 g/dL — ABNORMAL LOW (ref 3.5–5.0)
Alkaline Phosphatase: 89 U/L (ref 38–126)
Anion gap: 14 (ref 5–15)
BUN: 9 mg/dL (ref 6–20)
CO2: 28 mmol/L (ref 22–32)
Calcium: 7.9 mg/dL — ABNORMAL LOW (ref 8.9–10.3)
Chloride: 89 mmol/L — ABNORMAL LOW (ref 98–111)
Creatinine, Ser: 0.68 mg/dL (ref 0.44–1.00)
GFR calc Af Amer: >60 mL/min (ref >60)
GFR calc non Af Amer: >60 mL/min (ref >60)
Glucose, Bld: 173 mg/dL — ABNORMAL HIGH (ref 70–99)
Potassium: 2.5 mmol/L — CL (ref 3.5–5.1)
Sodium: 131 mmol/L — ABNORMAL LOW (ref 135–145)
Total Bilirubin: 1.5 mg/dL — ABNORMAL HIGH (ref 0.3–1.2)
Total Protein: 5.3 g/dL — ABNORMAL LOW (ref 6.5–8.1)

## 2018-08-19 LAB — PROTIME-INR
INR: 1.4 — ABNORMAL HIGH (ref 0.8–1.2)
Prothrombin Time: 16.8 seconds — ABNORMAL HIGH (ref 11.4–15.2)

## 2018-08-19 LAB — HEPATIC FUNCTION PANEL
ALT: 21 U/L (ref 0–44)
AST: 38 U/L (ref 15–41)
Albumin: 3 g/dL — ABNORMAL LOW (ref 3.5–5.0)
Alkaline Phosphatase: 102 U/L (ref 38–126)
Bilirubin, Direct: 0.4 mg/dL — ABNORMAL HIGH (ref 0.0–0.2)
Indirect Bilirubin: 0.8 mg/dL (ref 0.3–0.9)
Total Bilirubin: 1.2 mg/dL (ref 0.3–1.2)
Total Protein: 5.4 g/dL — ABNORMAL LOW (ref 6.5–8.1)

## 2018-08-19 LAB — SARS CORONAVIRUS 2 BY RT PCR (HOSPITAL ORDER, PERFORMED IN ~~LOC~~ HOSPITAL LAB): SARS Coronavirus 2: NEGATIVE

## 2018-08-19 LAB — BRAIN NATRIURETIC PEPTIDE: B Natriuretic Peptide: 29 pg/mL (ref 0.0–100.0)

## 2018-08-19 LAB — APTT: aPTT: 30 seconds (ref 24–36)

## 2018-08-19 MED ORDER — PANTOPRAZOLE SODIUM 40 MG PO TBEC
40.0000 mg | DELAYED_RELEASE_TABLET | Freq: Two times a day (BID) | ORAL | Status: DC
  Administered 2018-08-20: 40 mg via ORAL
  Filled 2018-08-19: qty 1

## 2018-08-19 MED ORDER — ADULT MULTIVITAMIN W/MINERALS CH
1.0000 | ORAL_TABLET | Freq: Every day | ORAL | Status: DC
  Administered 2018-08-20: 1 via ORAL
  Filled 2018-08-19: qty 1

## 2018-08-19 MED ORDER — ONDANSETRON HCL 4 MG PO TABS: 4.0000 mg | ORAL_TABLET | Freq: Four times a day (QID) | ORAL | Status: DC | PRN

## 2018-08-19 MED ORDER — SODIUM CHLORIDE 0.9% FLUSH
3.0000 mL | Freq: Two times a day (BID) | INTRAVENOUS | Status: DC
  Administered 2018-08-19 – 2018-08-20 (×2): 3 mL via INTRAVENOUS

## 2018-08-19 MED ORDER — MAGNESIUM OXIDE 400 MG PO TABS
400.0000 mg | ORAL_TABLET | Freq: Every day | ORAL | Status: DC
  Administered 2018-08-20: 400 mg via ORAL
  Filled 2018-08-19 (×2): qty 1

## 2018-08-19 MED ORDER — ALPRAZOLAM 0.5 MG PO TABS
1.0000 mg | ORAL_TABLET | Freq: Three times a day (TID) | ORAL | Status: DC | PRN
  Administered 2018-08-19: 1 mg via ORAL
  Filled 2018-08-19: qty 2

## 2018-08-19 MED ORDER — LACTULOSE 10 GM/15ML PO SOLN
40.0000 g | Freq: Four times a day (QID) | ORAL | Status: DC
  Filled 2018-08-19: qty 60

## 2018-08-19 MED ORDER — POLYETHYLENE GLYCOL 3350 17 G PO PACK: 17.0000 g | PACK | Freq: Every day | ORAL | Status: DC | PRN

## 2018-08-19 MED ORDER — POTASSIUM CHLORIDE CRYS ER 20 MEQ PO TBCR
20.0000 meq | EXTENDED_RELEASE_TABLET | Freq: Two times a day (BID) | ORAL | Status: DC
  Administered 2018-08-19 – 2018-08-20 (×2): 20 meq via ORAL
  Filled 2018-08-19 (×2): qty 1

## 2018-08-19 MED ORDER — FOLIC ACID 1 MG PO TABS
1.0000 mg | ORAL_TABLET | Freq: Every day | ORAL | Status: DC
  Administered 2018-08-20: 1 mg via ORAL
  Filled 2018-08-19: qty 1

## 2018-08-19 MED ORDER — POTASSIUM CHLORIDE 20 MEQ PO PACK
40.0000 meq | PACK | Freq: Once | ORAL | Status: AC
  Administered 2018-08-20: 40 meq via ORAL
  Filled 2018-08-19: qty 2

## 2018-08-19 MED ORDER — ONDANSETRON HCL 4 MG/2ML IJ SOLN: 4.0000 mg | Freq: Four times a day (QID) | INTRAMUSCULAR | Status: DC | PRN

## 2018-08-19 MED ORDER — BUMETANIDE 1 MG PO TABS
3.0000 mg | ORAL_TABLET | Freq: Every day | ORAL | Status: DC
  Filled 2018-08-19: qty 3

## 2018-08-19 MED ORDER — SPIRONOLACTONE 100 MG PO TABS
400.0000 mg | ORAL_TABLET | Freq: Every day | ORAL | Status: DC
  Filled 2018-08-19: qty 4

## 2018-08-19 MED ORDER — OXYCODONE HCL 5 MG PO TABS
10.0000 mg | ORAL_TABLET | Freq: Three times a day (TID) | ORAL | Status: DC
  Administered 2018-08-19: 10 mg via ORAL
  Filled 2018-08-19: qty 2

## 2018-08-19 MED ORDER — RIFAXIMIN 550 MG PO TABS
550.0000 mg | ORAL_TABLET | Freq: Every day | ORAL | Status: DC
  Administered 2018-08-20: 550 mg via ORAL
  Filled 2018-08-19: qty 1

## 2018-08-19 MED ORDER — CITALOPRAM HYDROBROMIDE 20 MG PO TABS
40.0000 mg | ORAL_TABLET | Freq: Every day | ORAL | Status: DC
  Administered 2018-08-20: 40 mg via ORAL
  Filled 2018-08-19: qty 2

## 2018-08-19 MED ORDER — DICYCLOMINE HCL 10 MG PO CAPS
10.0000 mg | ORAL_CAPSULE | Freq: Two times a day (BID) | ORAL | Status: DC
  Administered 2018-08-20: 10:00:00 10 mg via ORAL
  Filled 2018-08-19: qty 1

## 2018-08-19 MED ORDER — OXYCODONE HCL 5 MG PO TABS
10.0000 mg | ORAL_TABLET | Freq: Four times a day (QID) | ORAL | Status: DC | PRN
  Administered 2018-08-20: 10 mg via ORAL
  Filled 2018-08-19: qty 2

## 2018-08-19 MED ORDER — ONDANSETRON HCL 4 MG/2ML IJ SOLN
4.0000 mg | Freq: Once | INTRAMUSCULAR | Status: AC
  Administered 2018-08-19: 4 mg via INTRAVENOUS
  Filled 2018-08-19: qty 2

## 2018-08-19 MED ORDER — VITAMIN B-12 1000 MCG PO TABS
2000.0000 ug | ORAL_TABLET | Freq: Every day | ORAL | Status: DC
  Administered 2018-08-20: 2000 ug via ORAL
  Filled 2018-08-19: qty 2

## 2018-08-19 MED ORDER — INTRINSI B12-FOLATE 800-500-20 MCG-MCG-MG PO TABS: 1.0000 | ORAL_TABLET | Freq: Every day | ORAL | Status: DC

## 2018-08-19 MED ORDER — MORPHINE SULFATE (PF) 2 MG/ML IV SOLN
2.0000 mg | Freq: Once | INTRAVENOUS | Status: AC
  Administered 2018-08-19: 2 mg via INTRAVENOUS
  Filled 2018-08-19: qty 1

## 2018-08-19 MED ORDER — GABAPENTIN 300 MG PO CAPS
900.0000 mg | ORAL_CAPSULE | Freq: Three times a day (TID) | ORAL | Status: DC
  Administered 2018-08-19 – 2018-08-20 (×2): 900 mg via ORAL
  Filled 2018-08-19 (×2): qty 3

## 2018-08-19 MED ORDER — ALBUTEROL SULFATE HFA 108 (90 BASE) MCG/ACT IN AERS
2.0000 | INHALATION_SPRAY | Freq: Four times a day (QID) | RESPIRATORY_TRACT | Status: DC | PRN
  Filled 2018-08-19: qty 6.7

## 2018-08-19 NOTE — ED Notes (Signed)
Visualized pt. Pt resting and watching tv. Will continue to monitor.

## 2018-08-19 NOTE — ED Notes (Signed)
In to answer pt's call bell; says she didn't push the button; no requests or complaints; understands we're waiting on her room assignment for admission

## 2018-08-19 NOTE — H&P (Signed)
[place] [facility] [place] NAME: Lori Mckenzie    MR#:  [id number]  DATE OF BIRTH:  05-29-1965  DATE OF ADMISSION:  08/19/2018  PRIMARY CARE PHYSICIAN: Ashkin, Neldon Labella, MD   REQUESTING/REFERRING PHYSICIAN: Lavonia Drafts, MD  CHIEF COMPLAINT:   Chief Complaint  Patient presents with  . Shortness of Breath    HISTORY OF PRESENT ILLNESS:  Lori Mckenzie  is a 53 y.o. 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  She presents to the emergency room with complaints of increased shortness of breath and fluid overload.  She is short of breath with conversation as well as with minimal physical exertion such as getting dressed.  She reports a 12 to 14 pound weight gain since her last 4.5 L ascitic fluid removed 2 days ago on Friday, 08/17/2018.  She denies fevers or chills.  She denies chest pain.  She denies abdominal pain, however, she is experiencing discomfort and tightness.  She denies nausea or vomiting.  She denies diarrhea.  She denies hematemesis, hematochezia, or melena.  Labs on arrival with ammonia level 28, bilirubin is 1.5, potassium is 2.5, platelet count 97,000, INR is 1.4.  Chest x-ray demonstrates small left pleural effusion.  Rapid COVID-19 testing is negative.  We have admitted her to the hospitalist service for further management  PAST MEDICAL HISTORY:   Past Medical History:  Diagnosis Date  . Abdominal abscess 08/25/2014  . Acid reflux 08/10/2010  . Acute cervical myofascial strain 02/09/2016  . Acute postoperative pain 08/09/2016  . Anxiety   . Ascites   . Asthma   . Back pain   . Bile leak, postoperative 07/17/2014  . Brittle bone disease   . Cancer ([place])    Uteriine  ca 56yr ago partial hysterectomy  . Cervical disc disease   . Chronic kidney disease   . Collagen vascular disease ([place]    RA  3-4 yrs ago  . COPD (chronic  obstructive pulmonary disease) ([place]   . Diabetes [place] complication [place]   . GERD (gastroesophageal reflux disease)   . Hypertension   . Hypothyroidism   . Left upper quadrant pain 01/09/2014  . Major depressive disorder with single episode 12/05/2011  . Major depressive disorder, single episode 12/05/2011  . Migraines   . NASH (nonalcoholic steatohepatitis)   . Respiratory infection    2/17  . Shock ([place] 09/18/2014  . Sleep apnea   . Sleep apnea   . Syncope 11/16/2014  . Thyroid disease   . TIA (transient ischemic attack)     PAST SURGICAL HISTORY:   Past Surgical History:  Procedure Laterality Date  . ABDOMINAL HYSTERECTOMY    . CHOLECYSTECTOMY N/A 07/15/2014   Procedure: LAPAROSCOPIC CHOLECYSTECTOMY with liver biopsy ;  Surgeon: MSherri Rad MD;  Location: [facility] ORS;  Service: General;  Laterality: N/A;  . COLONOSCOPY WITH PROPOFOL N/A 06/23/2014   Procedure: COLONOSCOPY WITH PROPOFOL;  Surgeon: MLollie Sails MD;  [facility];  Service: Endoscopy;  Laterality: N/A;  . ERCP N/A 07/16/2014   Procedure: ENDOSCOPIC RETROGRADE CHOLANGIOPANCREATOGRAPHY (ERCP);  Surgeon: MClarene Essex MD;  [facility];  Service: Endoscopy;  Laterality: N/A;  . ERCP N/A 10/03/2014   Procedure: ENDOSCOPIC RETROGRADE CHOLANGIOPANCREATOGRAPHY (ERCP);  Surgeon: PHulen Luster MD;  [facility];  Service: Gastroenterology;  Laterality: N/A;  . ESOPHAGOGASTRODUODENOSCOPY N/A 06/23/2014   Procedure:  ESOPHAGOGASTRODUODENOSCOPY (EGD);  Surgeon: Lollie Sails, MD;  Location[facility];  Service: Endoscopy;  Laterality: N/A;  . ESOPHAGOGASTRODUODENOSCOPY N/A 06/30/2016   Procedure: ESOPHAGOGASTRODUODENOSCOPY (EGD);  Surgeon: Jonathon Bellows, MD;  Location: [facility] ENDOSCOPY;  Service: Endoscopy;  Laterality: N/A;  . ESOPHAGOGASTRODUODENOSCOPY (EGD) WITH PROPOFOL N/A 12/01/2015   Procedure: ESOPHAGOGASTRODUODENOSCOPY (EGD) WITH PROPOFOL;  Surgeon: Lollie Sails, MD;   [facility];  Service: Endoscopy;  Laterality: N/A;  . TUBAL LIGATION    . WISDOM TOOTH EXTRACTION      SOCIAL HISTORY:   Social History   Tobacco Use  . Smoking status: Never Smoker  . Smokeless tobacco: Never Used  Substance Use Topics  . Alcohol use: No    Comment: occ    FAMILY HISTORY:   Family History  Problem Relation Age of Onset  . Lung cancer Mother   . Ulcers Father   . Emphysema Father   . Heart disease Sister   . Ulcers Sister   . Heart disease Brother     DRUG ALLERGIES:   Allergies  Allergen Reactions  . Hydrocodone-Acetaminophen   . Nsaids Other (See Comments)    Liver problems Liver problems   . Tape Swelling  . Vicodin [Hydrocodone-Acetaminophen] Itching and Rash    REVIEW OF SYSTEMS:   Review of Systems  Constitutional: Negative for chills, fever and malaise/fatigue.  HENT: Negative for congestion and sore throat.   Eyes: Negative for blurred vision and double vision.  Respiratory: Positive for shortness of breath. Negative for cough, sputum production and wheezing.   Cardiovascular: Negative for chest pain, palpitations and leg swelling.  Gastrointestinal: Positive for abdominal pain. Negative for blood in stool, constipation, diarrhea, heartburn, melena, nausea and vomiting.  Genitourinary: Negative for dysuria, flank pain and hematuria.  Musculoskeletal: Negative for falls and myalgias.  Skin: Negative for itching and rash.  Neurological: Negative for dizziness, focal weakness and headaches.  Psychiatric/Behavioral: Positive for depression (recent death of both her husband and her son over the last 32 days).    MEDICATIONS AT HOME:   Prior to Admission medications   Medication Sig Start Date End Date Taking? Authorizing Provider  albuterol (PROVENTIL HFA;VENTOLIN HFA) 108 (90 Base) MCG/ACT inhaler Inhale 2 puffs into the lungs every 6 (six) hours as needed for wheezing or shortness of breath.   Yes [provider]  ALPRAZolam Duanne Moron) 1 MG tablet Take 1 mg by mouth 3 (three) times daily as needed.    Yes [provider]  bumetanide (BUMEX) 1 MG tablet Take 3 mg by mouth 2 (two) times a day. 07/28/18 08/27/18 Yes [provider]  cholecalciferol (VITAMIN D) 1000 units tablet Take 2,000 Units by mouth daily.   Yes [provider]  citalopram (CELEXA) 40 MG tablet Take 40 mg by mouth daily.  07/24/15  Yes [provider]  dicyclomine (BENTYL) 10 MG capsule Take 10 mg by mouth 2 (two) times daily. 07/23/18  Yes [provider]  fluticasone (FLONASE) 50 MCG/ACT nasal spray Place 1-2 sprays into both nostrils daily as needed for rhinitis.    Yes [provider]  Folate-B12-Intrinsic Factor (INTRINSI B12-FOLATE) 166-060-04 MCG-MCG-MG TABS Take 1 tablet by mouth daily.    Yes [provider]  gabapentin (NEURONTIN) 300 MG capsule Take 900 mg by mouth 3 (three) times daily.    Yes [provider]  insulin regular human CONCENTRATED (HUMULIN R) 500 UNIT/ML injection Inject 100-200 Units into the skin 2 (two) times a day. Inject 200  units in the morning, inject 100 units nightly. Based on glucose readings   Yes [provider]  lactulose (CHRONULAC) 10 GM/15ML solution Take 30 mLs (20 g total) by mouth 3 (three) times daily. Patient taking differently: Take 40 g by mouth 4 (four) times daily.  10/26/16  Yes Gouru, Illene Silver, MD  nystatin (MYCOSTATIN/NYSTOP) powder Apply 1 g topically 3 (three) times daily as needed. For irritation 10/26/16  Yes Gouru, Aruna, MD  Oxycodone HCl 10 MG TABS Take 10 mg by mouth 3 (three) times daily.    Yes [provider]  pantoprazole (PROTONIX) 40 MG tablet Take 40 mg by mouth 2 (two) times daily before a meal.   Yes [provider]  potassium chloride SA (K-DUR,KLOR-CON) 20 MEQ tablet Take 20 mEq by mouth daily as needed. For high blood pressure   Yes [provider]  promethazine  (PHENERGAN) 12.5 MG tablet Take 2 tablets (25 mg total) by mouth every 6 (six) hours as needed for nausea or vomiting. Patient taking differently: Take 12.5-25 mg by mouth every 6 (six) hours as needed for nausea or vomiting.  09/25/16  Yes Dustin Flock, MD  rifaximin (XIFAXAN) 550 MG TABS tablet Take 550 mg by mouth daily.    Yes [provider]  spironolactone (ALDACTONE) 100 MG tablet Take 400 mg by mouth daily.    Yes [provider]  vitamin B-12 (CYANOCOBALAMIN) 1000 MCG tablet Take 2,000 mcg by mouth daily.   Yes [provider]  diclofenac sodium (VOLTAREN) 1 % GEL Apply 4 g topically every 8 (eight) hours as needed. Patient taking differently: Apply 4 g topically every 8 (eight) hours as needed (FOR PAIN).  12/06/16 07/29/18  Milinda Pointer, MD  SUMAtriptan (IMITREX) 50 MG tablet Take 50 mg by mouth every 2 (two) hours as needed. For migraines  03/17/11  [provider]      VITAL SIGNS:  Blood pressure (!) 114/95, pulse (!) 112, temperature 98.9 F (37.2 C), temperature source Oral, resp. rate (!) 24, SpO2 97 %.  PHYSICAL EXAMINATION:  Physical Exam  GENERAL:  53 y.o.-year-old patient lying in the bed with no acute distress.  EYES: Pupils equal, round, reactive to light and accommodation. No scleral icterus. Extraocular muscles intact.  HEENT: Head atraumatic, normocephalic. Oropharynx and nasopharynx clear.  NECK:  Supple, no jugular venous distention. No thyroid enlargement, no tenderness.  LUNGS: Normal breath sounds bilaterally, no wheezing, rales,rhonchi or crepitation. No use of accessory muscles of respiration.  CARDIOVASCULAR: Regular rate and rhythm, S1, S2 normal. No murmurs, rubs, or gallops.  ABDOMEN: Firm, distended with ascites, and nontender. Bowel sounds present. No organomegaly or mass.  EXTREMITIES: No pedal edema, cyanosis, or clubbing.  NEUROLOGIC: Cranial nerves II through XII are intact. Muscle strength 5/5 in all  extremities. Sensation intact. Gait not checked.  PSYCHIATRIC: The patient is alert and oriented x 3.  Emotional and tearful. SKIN: No obvious rash, lesion, or ulcer.   LABORATORY PANEL:   CBC Recent Labs  Lab 08/19/18 1755  WBC 5.7  HGB 12.7  HCT 35.8*  PLT 97*   ------------------------------------------------------------------------------------------------------------------  Chemistries  Recent Labs  Lab 08/19/18 1755  NA 131*  K 2.5*  CL 89*  CO2 28  GLUCOSE 173*  BUN 9  CREATININE 0.68  CALCIUM 7.9*  AST 36  ALT 21  ALKPHOS 89  BILITOT 1.5*   ------------------------------------------------------------------------------------------------------------------  Cardiac Enzymes No results for input(s): TROPONINI in the last 168 hours. ------------------------------------------------------------------------------------------------------------------  RADIOLOGY:  Dg  Chest Port 1 View  Result Date: 08/19/2018 CLINICAL DATA:  Increased shortness of breath, weight gain since last paracentesis 2 days ago. EXAM: PORTABLE CHEST 1 VIEW COMPARISON:  Chest x-ray dated 08/17/2018. FINDINGS: Reaccumulation of pleural effusion at the LEFT costophrenic angle. RIGHT lung is clear. No pneumothorax seen. Borderline cardiomegaly, stable. IMPRESSION: Reaccumulation of small pleural effusion at the LEFT costophrenic angle. Electronically Signed   By: Franki Cabot M.D.   On: 08/19/2018 18:21      IMPRESSION AND PLAN:   1.  Cirrhosis with ascites - Interventional radiology consulted for therapeutic ultrasound guided paracentesis - Repeat chest x-ray in the a.m. - Continue lactulose -Repeat CBC and BMP as well as ammonia level in the a.m.  2.  Hypokalemia - Patient received 60 mg p.o. potassium replacement -Repeat BMP in the a.m. -Telemetry monitoring  3.  Diabetes mellitus - Moderate sliding scale insulin -Hemoglobin A1c pending  4.  Situational depression - Secondary to  recent loss of both her husband and her son -celexa continued - We will treat anxiety with alprazolam per home regimen  DVT prophylaxis with SCDs and PPI prophylaxis     All the records are reviewed and case discussed with ED provider. The plan of care was discussed in details with the patient (and family). I answered all questions. The patient agreed to proceed with the above mentioned plan. Further management will depend upon hospital course.   CODE STATUS: Full code  TOTAL TIME TAKING CARE OF THIS PATIENT: 45 minutes.    [place] on 08/19/2018 at 6:55 PM  Pager - [phone]  After 6pm go to [email or web address] - Proofreader  [facility]  Office  [phone]  CC: Primary care physician; Ashkin, Neldon Labella, MD   Note: This dictation was prepared with Dragon dictation along with smaller phrase technology. Any transcriptional errors that result from this process are unintentional.

## 2018-08-19 NOTE — ED Notes (Signed)
Date and time results received: 08/19/18 1823 (use smartphrase ".now" to insert current time)  Test:  K+ Critical Value: 2.3  Name of Provider Notified: [place]  Orders Received? Or Actions Taken?: Critical Results Acknowledged

## 2018-08-19 NOTE — ED Triage Notes (Signed)
Patient c/o increased shortness of breath and a weight gain of 12-15 lb since last paracentesis two days ago.

## 2018-08-19 NOTE — ED Provider Notes (Signed)
[facility] Emergency Department Provider Note   ____________________________________________    I have reviewed the triage vital signs and the nursing notes.   HISTORY  Chief Complaint Shortness of Breath     HPI Lori Mckenzie is a 53 y.o. female who presents with complaints of shortness of breath as well as fluid overload.  Patient reports has a history of COPD, diabetes, cirrhosis secondary to [facility] who frequently requires large-volume paracentesis often multiple times per week who presents reporting she has gained 12 to 14 pounds since having 4.5 L removed 2 days ago.  She complains of shortness of breath.  Denies abdominal pain but does feel distention and tightness.  Denies fevers or chills.  Reports compliance with her medications  Past Medical History:  Diagnosis Date  . Abdominal abscess 08/25/2014  . Acid reflux 08/10/2010  . Acute cervical myofascial strain 02/09/2016  . Acute postoperative pain 08/09/2016  . Anxiety   . Ascites   . Asthma   . Back pain   . Bile leak, postoperative 07/17/2014  . Brittle bone disease   . Cancer ([place])    Uteriine  ca 37yr ago partial hysterectomy  . Cervical disc disease   . Chronic kidney disease   . Collagen vascular disease ([place]    RA  3-4 yrs ago  . COPD (chronic obstructive pulmonary disease) ([place]   . Diabetes mellitus without complication ([place]   . GERD (gastroesophageal reflux disease)   . Hypertension   . Hypothyroidism   . Left upper quadrant pain 01/09/2014  . Major depressive disorder with single episode 12/05/2011  . Major depressive disorder, single episode 12/05/2011  . Migraines   . NASH (nonalcoholic steatohepatitis)   . Respiratory infection    2/17  . Shock ([place] 09/18/2014  . Sleep apnea   . Sleep apnea   . Syncope 11/16/2014  . Thyroid disease   . TIA (transient ischemic attack)     Patient Active Problem List   Diagnosis Date Noted  . UTI (urinary tract infection) 06/11/2017  .  Neurogenic pain 02/13/2017  . Chest pain 12/20/2016  . Chronic feet pain (Secondary source of pain) (Bilateral) (R>L) 10/26/2016  . Acute hepatic encephalopathy 09/23/2016  . Low back pain due to L1-2 disc extrusion (caudal) (Left) 07/11/2016  . Abdominal pain 06/28/2016  . Constipation 06/20/2016  . Hyperglycemia 06/20/2016  . Nausea without vomiting 06/06/2016  . Chronic lower extremity pain (Bilateral) 05/19/2016  . Spinal stenosis, thoracic region (T10-11) 05/19/2016  . Diabetes mellitus, insulin dependent (IDDM), uncontrolled ([place] 04/12/2016  . Chronic sacroiliac joint pain (Bilateral) (L>R) 03/23/2016  . Chronic upper extremity pain (Left) 03/10/2016  . Chronic radicular cervical pain (L) 03/10/2016  . Osteoarthritis 02/24/2016  . Allodynia 02/09/2016  . Chronic pain syndrome 01/07/2016  . Opiate withdrawal ([place] 12/22/2015  . Elevated liver enzymes 09/28/2015  . Cirrhosis of liver with ascites ([place] 09/24/2015  . Cirrhosis ([place] 09/24/2015  . Altered mental status 07/24/2015  . Uncontrolled diabetes mellitus ([place] 07/24/2015  . Radicular pain of thoracic region 04/06/2015  . Hepatic encephalopathy ([place] 03/11/2015  . Elevated sedimentation rate 03/05/2015  . Elevated C-reactive protein (CRP) 03/05/2015  . Lumbar facet syndrome (Bilateral) (R>L) 03/05/2015  . Cervical spondylosis 03/05/2015  . Lumbar spondylosis 03/05/2015  . Encounter for chronic pain management 03/05/2015  . Chronic shoulder pain (Bilateral) (R>L) 03/05/2015  . Chronic carpal tunnel syndrome (Bilateral) 03/05/2015  . Chronic hip pain (Bilateral) (L>R) 03/05/2015  . Chronic upper back  pain (Bilateral) (L>R) 03/05/2015  . Osteoporosis, idiopathic 03/05/2015  . Abnormal MRI, lumbar spine (02/03/2015) 03/05/2015  . Thoracic radiculitis 02/05/2015  . Vitamin D deficiency 01/13/2015  . B12 deficiency 01/13/2015  . Folate deficiency 01/13/2015  . Subacute lumbar radiculopathy (left side) (S1 dermatome)  12/10/2014  . Drowsiness 11/16/2014  . Episode of syncope 11/16/2014  . Somnolence 11/16/2014  . Opiate use (75 MME/Day) 10/28/2014  . Long term prescription opiate use 10/28/2014  . Long term current use of opiate analgesic 10/28/2014  . Encounter for therapeutic drug level monitoring 10/28/2014  . Chronic epigastric abdominal pain 10/28/2014  . Chronic low back pain (Primary Source of Pain) (Bilateral) (R>L) 10/28/2014  . Chronic neck pain [facility] source of pain) (Bilateral) (R>L) 10/28/2014  . Ascites 09/05/2014  . NASH (nonalcoholic steatohepatitis) 09/05/2014  . Hypokalemia 09/05/2014  . Dysthymia 08/05/2014  . Other social stressor 08/05/2014  . Steatohepatitis 07/15/2014  . Type 2 diabetes mellitus ([place]) 07/12/2014  . COPD with acute exacerbation ([place]) 07/12/2014  . GERD (gastroesophageal reflux disease) 07/12/2014  . OSA on CPAP 07/12/2014  . Anxiety 07/12/2014  . 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 Lumbar and sacral osteoarthritis 03/26/2014  . Myofascial pain 03/26/2014  . Lumbar spinal stenosis 03/26/2014  . Lumbosacral spondylosis without myelopathy 03/26/2014  . 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 Lumbar central spinal stenosis (L1-2 and L4-5) 03/26/2014  . Spondylosis of lumbar region without myelopathy or radiculopathy 03/26/2014  . Breath shortness 01/09/2014  . Diastolic dysfunction with chronic heart failure ([place]) 08/06/2013  . Airway hyperreactivity 08/06/2013  . Essential (primary) hypertension 08/06/2013  . Asthma 08/06/2013  . Clinical depression 12/05/2011    Past Surgical History:  Procedure Laterality Date  . ABDOMINAL HYSTERECTOMY    . CHOLECYSTECTOMY N/A 07/15/2014   Procedure: LAPAROSCOPIC CHOLECYSTECTOMY with liver biopsy ;  Surgeon: Sherri Rad, MD;  Location: [facility] ORS;  Service: General;  Laterality: N/A;  . COLONOSCOPY WITH PROPOFOL N/A 06/23/2014   Procedure: COLONOSCOPY WITH PROPOFOL;  Surgeon: Lollie Sails, MD;   Location: [facility] ENDOSCOPY;  Service: Endoscopy;  Laterality: N/A;  . ERCP N/A 07/16/2014   Procedure: ENDOSCOPIC RETROGRADE CHOLANGIOPANCREATOGRAPHY (ERCP);  Surgeon: Clarene Essex, MD;  Location: [facility] ENDOSCOPY;  Service: Endoscopy;  Laterality: N/A;  . ERCP N/A 10/03/2014   Procedure: ENDOSCOPIC RETROGRADE CHOLANGIOPANCREATOGRAPHY (ERCP);  Surgeon: Hulen Luster, MD;  Location: [facility] ENDOSCOPY;  Service: Gastroenterology;  Laterality: N/A;  . ESOPHAGOGASTRODUODENOSCOPY N/A 06/23/2014   Procedure: ESOPHAGOGASTRODUODENOSCOPY (EGD);  Surgeon: Lollie Sails, MD;  Location: [facility] ENDOSCOPY;  Service: Endoscopy;  Laterality: N/A;  . ESOPHAGOGASTRODUODENOSCOPY N/A 06/30/2016   Procedure: ESOPHAGOGASTRODUODENOSCOPY (EGD);  Surgeon: Jonathon Bellows, MD;  Location: [facility] ENDOSCOPY;  Service: Endoscopy;  Laterality: N/A;  . ESOPHAGOGASTRODUODENOSCOPY (EGD) WITH PROPOFOL N/A 12/01/2015   Procedure: ESOPHAGOGASTRODUODENOSCOPY (EGD) WITH PROPOFOL;  Surgeon: Lollie Sails, MD;  Location: [facility] ENDOSCOPY;  Service: Endoscopy;  Laterality: N/A;  . TUBAL LIGATION    . WISDOM TOOTH EXTRACTION      Prior to Admission medications   Medication Sig Start Date End Date Taking? Authorizing Provider  albuterol (PROVENTIL HFA;VENTOLIN HFA) 108 (90 Base) MCG/ACT inhaler Inhale 2 puffs into the lungs every 6 (six) hours as needed for wheezing or shortness of breath.    [provider]  ALPRAZolam Duanne Moron) 1 MG tablet Take 1 mg by mouth 3 (three) times daily.    [provider]  bumetanide (BUMEX) 1 MG tablet Take 3 mg by mouth 2 (two) times a day.  07/28/18 08/27/18  [provider]  cholecalciferol (VITAMIN D) 1000 units tablet Take 2,000 Units by mouth daily.    [provider]  citalopram (CELEXA) 40 MG tablet Take 40 mg by mouth daily.  07/24/15   [provider]  diclofenac sodium (VOLTAREN) 1 % GEL Apply 4 g topically every 8 (eight) hours as needed. Patient taking differently: Apply 4 g  topically every 8 (eight) hours as needed (FOR PAIN).  12/06/16 07/29/18  Milinda Pointer, MD  fluticasone (FLONASE) 50 MCG/ACT nasal spray Place 1-2 sprays into both nostrils daily.     [provider]  Folate-B12-Intrinsic Factor (INTRINSI B12-FOLATE) 035-597-41 MCG-MCG-MG TABS Take 1 tablet by mouth daily.     [provider]  gabapentin (NEURONTIN) 300 MG capsule Take 600 mg by mouth 4 (four) times daily.     [provider]  insulin aspart (NOVOLOG) 100 UNIT/ML injection Inject 44 Units into the skin 3 (three) times daily with meals.    [provider]  insulin regular human CONCENTRATED (HUMULIN R) 500 UNIT/ML injection Inject 100-200 Units into the skin 2 (two) times a day. Inject 200 units in the morning, inject 100 units nightly. Based on glucose readings    [provider]  ipratropium-albuterol (DUONEB) 0.5-2.5 (3) MG/3ML SOLN Take 3 mLs by nebulization every 4 (four) hours as needed. For shortness of breath/wheezing    [provider]  lactulose (CHRONULAC) 10 GM/15ML solution Take 30 mLs (20 g total) by mouth 3 (three) times daily. Patient taking differently: Take 40 g by mouth 4 (four) times daily.  10/26/16   Nicholes Mango, MD  nystatin (MYCOSTATIN/NYSTOP) powder Apply 1 g topically 3 (three) times daily as needed. For irritation 10/26/16   Gouru, Illene Silver, MD  Oxycodone HCl 10 MG TABS Take 10 mg by mouth 6 (six) times daily.    [provider]  pantoprazole (PROTONIX) 40 MG tablet Take 40 mg by mouth 2 (two) times daily before a meal.    [provider]  potassium chloride SA (K-DUR,KLOR-CON) 20 MEQ tablet Take 20 mEq by mouth daily as needed. For high blood pressure    [provider]  promethazine (PHENERGAN) 12.5 MG tablet Take 2 tablets (25 mg total) by mouth every 6 (six) hours as needed for nausea or vomiting. Patient taking differently: Take 12.5-25 mg by mouth every 6 (six) hours as needed for nausea  or vomiting.  09/25/16   Dustin Flock, MD  rifaximin (XIFAXAN) 550 MG TABS tablet Take 550 mg by mouth daily.     [provider]  rizatriptan (MAXALT) 10 MG tablet Take 10 mg by mouth as needed for migraine.     [provider]  spironolactone (ALDACTONE) 100 MG tablet Take 100 mg by mouth daily.     [provider]  vitamin B-12 (CYANOCOBALAMIN) 1000 MCG tablet Take 2,000 mcg by mouth daily.    [provider]  SUMAtriptan (IMITREX) 50 MG tablet Take 50 mg by mouth every 2 (two) hours as needed. For migraines  03/17/11  [provider]     Allergies Hydrocodone-acetaminophen, Nsaids, Tape, and Vicodin [hydrocodone-acetaminophen]  Family History  Problem Relation Age of Onset  . Lung cancer Mother   . Ulcers Father   . Emphysema Father   . Heart disease Sister   . Ulcers Sister   . Heart disease Brother     Social History Social History   Tobacco Use  . Smoking status: Never Smoker  . Smokeless  tobacco: Never Used  Substance Use Topics  . Alcohol use: No    Comment: occ  . Drug use: No    Review of Systems  Constitutional: No fever/chills Eyes: No visual changes.  ENT: No sore throat. Cardiovascular: Denies chest pain. Respiratory: As above Gastrointestinal: As above Genitourinary: Negative for dysuria. Musculoskeletal: Negative for back pain. Skin: Negative for rash. Neurological: Negative for headaches   ____________________________________________   PHYSICAL EXAM:  VITAL SIGNS: ED Triage Vitals  Enc Vitals Group     BP 08/19/18 1724 (!) 114/95     Pulse Rate 08/19/18 1724 (!) 112     Resp 08/19/18 1724 (!) 24     Temp 08/19/18 1724 98.9 F (37.2 C)     Temp Source 08/19/18 1724 Oral     SpO2 08/19/18 1724 97 %     Weight --      Height --      Head Circumference --      Peak Flow --      Pain Score 08/19/18 1726 8     Pain Loc --      Pain Edu? --      Excl. in [place]? --     Constitutional: Alert  and oriented.  Eyes: Conjunctivae are normal.   Nose: No congestion/rhinnorhea. Mouth/Throat: Mucous membranes are moist.    Cardiovascular: Normal rate, regular rhythm. Grossly normal heart sounds.  Good peripheral circulation. Respiratory: Mild tachypnea no retractions. Lungs CTAB. Gastrointestinal: Distended, nontender positive fluid wave  Musculoskeletal: No lower extremity tenderness nor edema.  Warm and well perfused Neurologic:  Normal speech and language. No gross focal neurologic deficits are appreciated.  Skin:  Skin is warm, dry and intact. No rash noted. Psychiatric: Mood and affect are normal. Speech and behavior are normal.  ____________________________________________   LABS (all labs ordered are listed, but only abnormal results are displayed)  Labs Reviewed  COMPREHENSIVE METABOLIC PANEL - Abnormal; Notable for the following components:      Result Value   Sodium 131 (*)    Potassium 2.5 (*)    Chloride 89 (*)    Glucose, Bld 173 (*)    Calcium 7.9 (*)    Total Protein 5.3 (*)    Albumin 2.8 (*)    Total Bilirubin 1.5 (*)    All other components within normal limits  CBC WITH DIFFERENTIAL/PLATELET - Abnormal; Notable for the following components:   RBC 3.80 (*)    HCT 35.8 (*)    Platelets 97 (*)    Lymphs Abs 0.5 (*)    All other components within normal limits  PROTIME-INR - Abnormal; Notable for the following components:   Prothrombin Time 16.8 (*)    INR 1.4 (*)    All other components within normal limits  SARS CORONAVIRUS 2 (HOSPITAL ORDER, [place] [facility])  APTT  BRAIN NATRIURETIC PEPTIDE   ____________________________________________  EKG  ED ECG REPORT I, Lavonia Drafts, the attending physician, personally viewed and interpreted this ECG.  Date: 08/19/2018  Rhythm: normal sinus rhythm QRS Axis: normal Intervals: normal ST/T Wave abnormalities: normal Narrative Interpretation: no evidence of acute ischemia   ____________________________________________  RADIOLOGY  Chest x-ray shows reaccumulation of pleural effusion ____________________________________________   PROCEDURES  Procedure(s) performed: No  Procedures   Critical Care performed: No ____________________________________________   INITIAL IMPRESSION / ASSESSMENT AND PLAN / ED COURSE  Pertinent labs & imaging results that were available during my care of the patient were reviewed by me  and considered in my medical decision making (see chart for details).  Patient with a history of cirrhosis secondary to [facility] syndrome with likely shortness of breath from rapid redevelopment of ascites.  Denies fever cough.  Will obtain x-ray, labs anticipate admission for paracentesis  Discussed with hospitalist service,     ____________________________________________   FINAL CLINICAL IMPRESSION(S) / ED DIAGNOSES  Final diagnoses:  Other ascites  SOB (shortness of breath)  Hypokalemia        Note:  This document was prepared using Dragon voice recognition software and may include unintentional dictation errors.   Lavonia Drafts, MD 08/19/18 [phone]

## 2018-08-20 ENCOUNTER — Other Ambulatory Visit: Payer: Self-pay

## 2018-08-20 ENCOUNTER — Encounter: Payer: Self-pay | Admitting: Emergency Medicine

## 2018-08-20 ENCOUNTER — Inpatient Hospital Stay: Payer: Medicaid - Out of State

## 2018-08-20 DIAGNOSIS — Z79899 Other long term (current) drug therapy: Secondary | ICD-10-CM | POA: Diagnosis not present

## 2018-08-20 DIAGNOSIS — E1122 Type 2 diabetes mellitus with diabetic chronic kidney disease: Secondary | ICD-10-CM | POA: Insufficient documentation

## 2018-08-20 DIAGNOSIS — R188 Other ascites: Secondary | ICD-10-CM | POA: Diagnosis not present

## 2018-08-20 DIAGNOSIS — R110 Nausea: Secondary | ICD-10-CM | POA: Diagnosis not present

## 2018-08-20 DIAGNOSIS — Z794 Long term (current) use of insulin: Secondary | ICD-10-CM | POA: Diagnosis not present

## 2018-08-20 DIAGNOSIS — I129 Hypertensive chronic kidney disease with stage 1 through stage 4 chronic kidney disease, or unspecified chronic kidney disease: Secondary | ICD-10-CM | POA: Diagnosis not present

## 2018-08-20 DIAGNOSIS — K838 Other specified diseases of biliary tract: Secondary | ICD-10-CM | POA: Insufficient documentation

## 2018-08-20 DIAGNOSIS — N189 Chronic kidney disease, unspecified: Secondary | ICD-10-CM | POA: Diagnosis not present

## 2018-08-20 DIAGNOSIS — J449 Chronic obstructive pulmonary disease, unspecified: Secondary | ICD-10-CM | POA: Insufficient documentation

## 2018-08-20 DIAGNOSIS — R1030 Lower abdominal pain, unspecified: Secondary | ICD-10-CM | POA: Insufficient documentation

## 2018-08-20 DIAGNOSIS — E039 Hypothyroidism, unspecified: Secondary | ICD-10-CM | POA: Diagnosis not present

## 2018-08-20 DIAGNOSIS — Z8542 Personal history of malignant neoplasm of other parts of uterus: Secondary | ICD-10-CM | POA: Insufficient documentation

## 2018-08-20 LAB — URINALYSIS, COMPLETE (UACMP) WITH MICROSCOPIC
Bilirubin Urine: NEGATIVE
Glucose, UA: NEGATIVE mg/dL
Hgb urine dipstick: NEGATIVE
Ketones, ur: NEGATIVE mg/dL
Leukocytes,Ua: NEGATIVE
Nitrite: NEGATIVE
Protein, ur: NEGATIVE mg/dL
Specific Gravity, Urine: >1.046 — ABNORMAL HIGH (ref 1.005–1.030)
pH: 6 (ref 5.0–8.0)

## 2018-08-20 LAB — CBC
HCT: 36.8 % (ref 36.0–46.0)
HCT: 36.9 % (ref 36.0–46.0)
Hemoglobin: 12.7 g/dL (ref 12.0–15.0)
Hemoglobin: 13 g/dL (ref 12.0–15.0)
MCH: 32.9 pg (ref 26.0–34.0)
MCH: 33.5 pg (ref 26.0–34.0)
MCHC: 34.5 g/dL (ref 30.0–36.0)
MCHC: 35.2 g/dL (ref 30.0–36.0)
MCV: 95.3 fL (ref 80.0–100.0)
MCV: 95.1 fL (ref 80.0–100.0)
Platelets: 84 10*3/uL — ABNORMAL LOW (ref 150–400)
Platelets: 94 10*3/uL — ABNORMAL LOW (ref 150–400)
RBC: 3.86 MIL/uL — ABNORMAL LOW (ref 3.87–5.11)
RBC: 3.88 MIL/uL (ref 3.87–5.11)
RDW: 13.7 % (ref 11.5–15.5)
RDW: 13.5 % (ref 11.5–15.5)
WBC: 4.1 10*3/uL (ref 4.0–10.5)
WBC: 5.1 10*3/uL (ref 4.0–10.5)
nRBC: 0 % (ref 0.0–0.2)
nRBC: 0 % (ref 0.0–0.2)

## 2018-08-20 LAB — COMPREHENSIVE METABOLIC PANEL
ALT: 24 U/L (ref 0–44)
AST: 42 U/L — ABNORMAL HIGH (ref 15–41)
Albumin: 2.8 g/dL — ABNORMAL LOW (ref 3.5–5.0)
Alkaline Phosphatase: 121 U/L (ref 38–126)
Anion gap: 7 (ref 5–15)
BUN: 11 mg/dL (ref 6–20)
CO2: 26 mmol/L (ref 22–32)
Calcium: 8.2 mg/dL — ABNORMAL LOW (ref 8.9–10.3)
Chloride: 95 mmol/L — ABNORMAL LOW (ref 98–111)
Creatinine, Ser: 0.79 mg/dL (ref 0.44–1.00)
GFR calc Af Amer: >60 mL/min (ref >60)
GFR calc non Af Amer: >60 mL/min (ref >60)
Glucose, Bld: 200 mg/dL — ABNORMAL HIGH (ref 70–99)
Potassium: 3.9 mmol/L (ref 3.5–5.1)
Sodium: 128 mmol/L — ABNORMAL LOW (ref 135–145)
Total Bilirubin: 1 mg/dL (ref 0.3–1.2)
Total Protein: 5.4 g/dL — ABNORMAL LOW (ref 6.5–8.1)

## 2018-08-20 LAB — BASIC METABOLIC PANEL
Anion gap: 6 (ref 5–15)
BUN: 9 mg/dL (ref 6–20)
CO2: 27 mmol/L (ref 22–32)
Calcium: 8 mg/dL — ABNORMAL LOW (ref 8.9–10.3)
Chloride: 98 mmol/L (ref 98–111)
Creatinine, Ser: 0.6 mg/dL (ref 0.44–1.00)
GFR calc Af Amer: >60 mL/min (ref >60)
GFR calc non Af Amer: >60 mL/min (ref >60)
Glucose, Bld: 222 mg/dL — ABNORMAL HIGH (ref 70–99)
Potassium: 3 mmol/L — ABNORMAL LOW (ref 3.5–5.1)
Sodium: 131 mmol/L — ABNORMAL LOW (ref 135–145)

## 2018-08-20 LAB — GLUCOSE, CAPILLARY: Glucose-Capillary: 158 mg/dL — ABNORMAL HIGH (ref 70–99)

## 2018-08-20 LAB — PROTIME-INR
INR: 1.4 — ABNORMAL HIGH (ref 0.8–1.2)
Prothrombin Time: 16.9 seconds — ABNORMAL HIGH (ref 11.4–15.2)

## 2018-08-20 LAB — LIPASE, BLOOD: Lipase: 24 U/L (ref 11–51)

## 2018-08-20 LAB — MAGNESIUM: Magnesium: 1.9 mg/dL (ref 1.7–2.4)

## 2018-08-20 MED ORDER — IOHEXOL 240 MG/ML SOLN
25.0000 mL | INTRAMUSCULAR | Status: AC
  Administered 2018-08-20 (×2): 25 mL via INTRAVENOUS

## 2018-08-20 MED ORDER — BUMETANIDE 1 MG PO TABS
3.0000 mg | ORAL_TABLET | Freq: Two times a day (BID) | ORAL | Status: DC
  Administered 2018-08-20: 3 mg via ORAL
  Filled 2018-08-20 (×2): qty 3

## 2018-08-20 MED ORDER — INSULIN ASPART 100 UNIT/ML ~~LOC~~ SOLN
0.0000 [IU] | Freq: Three times a day (TID) | SUBCUTANEOUS | Status: DC
  Administered 2018-08-20: 4 [IU] via SUBCUTANEOUS
  Filled 2018-08-20: qty 1

## 2018-08-20 MED ORDER — INSULIN ASPART 100 UNIT/ML ~~LOC~~ SOLN: 0.0000 [IU] | Freq: Every day | SUBCUTANEOUS | Status: DC

## 2018-08-20 MED ORDER — IOHEXOL 300 MG/ML  SOLN
100.0000 mL | Freq: Once | INTRAMUSCULAR | Status: AC | PRN
  Administered 2018-08-20: 100 mL via INTRAVENOUS

## 2018-08-20 MED ORDER — SPIRONOLACTONE 25 MG PO TABS
100.0000 mg | ORAL_TABLET | Freq: Every day | ORAL | Status: DC
  Administered 2018-08-20: 100 mg via ORAL
  Filled 2018-08-20: qty 4

## 2018-08-20 MED ORDER — HYOSCYAMINE SULFATE 0.125 MG SL SUBL
0.2500 mg | SUBLINGUAL_TABLET | Freq: Once | SUBLINGUAL | Status: AC
  Administered 2018-08-20: 0.25 mg via SUBLINGUAL
  Filled 2018-08-20: qty 2

## 2018-08-20 MED ORDER — POTASSIUM CHLORIDE 20 MEQ PO PACK: 40.0000 meq | PACK | Freq: Once | ORAL | Status: DC

## 2018-08-20 MED ORDER — ALUM & MAG HYDROXIDE-SIMETH 200-200-20 MG/5ML PO SUSP
30.0000 mL | Freq: Once | ORAL | Status: AC
  Administered 2018-08-20: 30 mL via ORAL
  Filled 2018-08-20: qty 30

## 2018-08-20 MED ORDER — KETOROLAC TROMETHAMINE 30 MG/ML IJ SOLN
30.0000 mg | Freq: Once | INTRAMUSCULAR | Status: AC
  Administered 2018-08-20: 30 mg via INTRAVENOUS
  Filled 2018-08-20: qty 1

## 2018-08-20 MED ORDER — LIDOCAINE VISCOUS HCL 2 % MT SOLN
15.0000 mL | Freq: Once | OROMUCOSAL | Status: AC
  Administered 2018-08-20: 15 mL via ORAL
  Filled 2018-08-20: qty 15

## 2018-08-20 NOTE — Progress Notes (Signed)
Pt discharged home via private car at 1530. Pt A&Ox4. AVS reviewed with pt and all questions were answered. Pt left with personal belongings, clothes, and upper dentures. Pt wheeled downstairs by this RN.

## 2018-08-20 NOTE — ED Triage Notes (Addendum)
Patient discharged from hospital today after having "fluid drawn off". Patient has extensive history of liver failure with frequent paracentesis. Patient now c/o abd pain and back pain. EMS vitals: 98.4 oral temp, 207 CBG, 107/67 BP, 94HR, 98% O2 on RA.

## 2018-08-20 NOTE — Discharge Instructions (Signed)
°-   Daily fluids < 2 liters. - Low salt diet - Check weight everyday and keep log. Take to your doctors appt. - Take extra dose of Bumex if you gain more than 3 pounds weight.   Please follow with your liver doctor in a week

## 2018-08-20 NOTE — Progress Notes (Signed)
Ch visited pt during rounds. Pt presented to have a flat affect upon entry as she sat upright in the bed. Pt is able to ambulate without assistance as witnessed by ch. Pt shared that she was having a hard time dealing with her current health changes related to her liver. Pt explained to ch the events that lead to her current health issues. Ch asked guided questions as the pt shared that she was not just dealing with health issues but has recently loss her son (in June) and husband (in July) and was overwhelmed with the grief and having to make decisions about her own health. Ch allowed space for pt to lament about her loved ones that she loss and her feelings of being all alone in the world with only her grandchildren to connect with. Ch provided words of encouragment and prayed for the care team to be healing hands for the pt and for her remaining family to be protected and be at peace.  Goal: F/U for spiritual and social support for pt; communicate w/ care team for updates on pt's POC/GOC   08/20/18 1500  Clinical Encounter Type  Visited With Patient  Visit Type Psychological support;Spiritual support;Social support  Spiritual Encounters  Spiritual Needs Grief support;Emotional;Prayer  Stress Factors  Patient Stress Factors Family relationships;Health changes;Major life changes;Loss of control;Loss  Family Stress Factors Health changes;Family relationships

## 2018-08-21 ENCOUNTER — Encounter: Payer: Self-pay | Admitting: Radiology

## 2018-08-21 ENCOUNTER — Emergency Department: Payer: Medicaid - Out of State

## 2018-08-21 ENCOUNTER — Emergency Department
Admission: EM | Admit: 2018-08-21 | Discharge: 2018-08-21 | Disposition: A | Discharge status: Home / Self Care | Payer: Medicaid - Out of State | Attending: Emergency Medicine | Admitting: Emergency Medicine

## 2018-08-21 DIAGNOSIS — R1030 Lower abdominal pain, unspecified: Secondary | ICD-10-CM

## 2018-08-21 DIAGNOSIS — K838 Other specified diseases of biliary tract: Secondary | ICD-10-CM

## 2018-08-21 MED ORDER — LIDOCAINE HCL (PF) 1 % IJ SOLN
INTRAMUSCULAR | Status: AC
  Filled 2018-08-21: qty 5

## 2018-08-21 MED ORDER — IOHEXOL 300 MG/ML  SOLN
100.0000 mL | Freq: Once | INTRAMUSCULAR | Status: AC | PRN
  Administered 2018-08-21: 100 mL via INTRAVENOUS

## 2018-08-21 NOTE — ED Notes (Signed)
Pt to ultrasound

## 2018-08-21 NOTE — ED Notes (Addendum)
MD at the bedside to discuss plan of care; preparing pt for discharge.

## 2018-08-21 NOTE — ED Notes (Signed)
Pt to CT

## 2018-08-21 NOTE — ED Provider Notes (Addendum)
[facility] Emergency Department Provider Note  ____________________________________________  Time seen: Approximately 4:59 AM  I have reviewed the triage vital signs and the nursing notes.   HISTORY  Chief Complaint Abdominal Pain   HPI Lori Mckenzie is a 53 y.o. 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  Patient was seen here yesterday for abdominal distention and underwent a large-volume paracentesis where 5 L of ascites were removed.  Patient started complaining of abdominal pain after that.  Underwent a CT scan which was negative.  She presents today with worsening abdominal pain.  She reports the pain is pressure, constant, severe, located in the suprapubic region.  Associated with nausea but no vomiting, no fever or chills, no dysuria or hematuria.  She denies having similar pain in the past with paracentesis.  She denies constipation or diarrhea.  Past Medical History:  Diagnosis Date   Abdominal abscess 08/25/2014   Acid reflux 08/10/2010   Acute cervical myofascial strain 02/09/2016   Acute postoperative pain 08/09/2016   Anxiety    Ascites    Asthma    Back pain    Bile leak, postoperative 07/17/2014   Brittle bone disease    Cancer ([place])    Uteriine  ca 82yr ago partial hysterectomy   Cervical disc disease    Chronic kidney disease    Collagen vascular disease ([place]    RA  3-4 yrs ago   [place]D [place]    Diabetes mellitus without complication (HCC)    GERD (gastroesophageal reflux disease)    Hypertension    Hypothyroidism    Left upper quadrant pain 01/09/2014   Major depressive disorder with single episode 12/05/2011   Major depressive disorder, single episode 12/05/2011   Migraines    NASH (nonalcoholic steatohepatitis)    Respiratory infection    2/17     [place]ock ([place] 09/18/2014   Sleep apnea    Sleep apnea    Syncope 11/16/2014   Thyroid disease    TIA (transient ischemic attack)     Patient Active Problem List   Diagnosis Date Noted   UTI (urinary tract infection) 06/11/2017   Neurogenic pain 02/13/2017   Chest pain 12/20/2016   Chronic feet pain (Secondary source of pain) (Bilateral) (R>L) 10/26/2016   Acute hepatic encephalopathy 09/23/2016   Low back pain due to L1-2 disc extrusion (caudal) (Left) 07/11/2016   Abdominal pain 06/28/2016   Constipation 06/20/2016   Hyperglycemia 06/20/2016   Nausea without vomiting 06/06/2016   Chronic lower extremity pain (Bilateral) 05/19/2016   Spinal stenosis, thoracic region (T10-11) 05/19/2016   Diabetes mellitus, insulin dependent (IDDM), uncontrolled ([place] 04/12/2016   Chronic sacroiliac joint pain (Bilateral) (L>R) 03/23/2016   Chronic upper extremity pain (Left) 03/10/2016   Chronic radicular cervical pain (L) 03/10/2016   Osteoarthritis 02/24/2016   Allodynia 02/09/2016   Chronic pain syndrome 01/07/2016   Opiate withdrawal ([place] 12/22/2015   Elevated liver enzymes 09/28/2015   Cirrhosis of liver with ascites ([place] 09/24/2015   Cirrhosis ([place] 09/24/2015   Altered mental status 07/24/2015   Uncontrolled diabetes mellitus ([place] 07/24/2015   Radicular pain of thoracic region 04/06/2015   Hepatic encephalopathy ([place] 03/11/2015   Elevated sedimentation rate 03/05/2015   Elevated C-reactive protein (CRP) 03/05/2015   Lumbar facet syndrome (Bilateral) (R>L) 03/05/2015   Cervical spondylosis 03/05/2015   Lumbar spondylosis 03/05/2015   Encounter  for chronic pain management 03/05/2015   Chronic shoulder pain (Bilateral) (R>L) 03/05/2015   Chronic carpal tunnel syndrome (Bilateral) 03/05/2015   Chronic hip pain (Bilateral) (L>R) 03/05/2015   Chronic upper back pain (Bilateral) (L>R) 03/05/2015   Osteoporosis, idiopathic 03/05/2015    Abnormal MRI, lumbar spine (02/03/2015) 03/05/2015   Thoracic radiculitis 02/05/2015   Vitamin D deficiency 01/13/2015   B12 deficiency 01/13/2015   Folate deficiency 01/13/2015   Subacute lumbar radiculopathy (left side) (S1 dermatome) 12/10/2014   Drowsiness 11/16/2014   Episode of syncope 11/16/2014   Somnolence 11/16/2014   Opiate use (75 MME/Day) 10/28/2014   Long term prescription opiate use 10/28/2014   Long term current use of opiate analgesic 10/28/2014   Encounter for therapeutic drug level monitoring 10/28/2014   Chronic epigastric abdominal pain 10/28/2014   Chronic low back pain (Primary Source of Pain) (Bilateral) (R>L) 10/28/2014   Chronic neck pain (Tertiary source of pain) (Bilateral) (R>L) 10/28/2014   Ascites 09/05/2014   NASH (nonalcoholic steatohepatitis) 09/05/2014   Hypokalemia 09/05/2014   Dysthymia 08/05/2014   Other social stressor 08/05/2014   Steatohepatitis 07/15/2014   Type 2 diabetes mellitus ([place]) 07/12/2014   COPD with acute exacerbation ([place]) 07/12/2014   GERD (gastroesophageal reflux disease) 07/12/2014   OSA on CPAP 07/12/2014   Anxiety 07/12/2014   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, L1-2, & L4-5) 03/26/2014   Lumbar and sacral osteoarthritis 03/26/2014   Myofascial pain 03/26/2014   Lumbar spinal stenosis 03/26/2014   Lumbosacral spondylosis without myelopathy 03/26/2014   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   Lumbar central spinal stenosis (L1-2 and L4-5) 03/26/2014   Spondylosis of lumbar region without myelopathy or radiculopathy 03/26/2014   Breath shortness 20/94/7096   Diastolic dysfunction with chronic heart failure ([place]) 08/06/2013   Airway hyperreactivity 08/06/2013   Essential (primary) hypertension 08/06/2013   Asthma 08/06/2013   Clinical depression 12/05/2011    Past Surgical History:  Procedure Laterality Date   ABDOMINAL HYSTERECTOMY     CHOLECYSTECTOMY N/A 07/15/2014   Procedure:  LAPAROSCOPIC CHOLECYSTECTOMY with liver biopsy ;  Surgeon: Sherri Rad, MD;  Location: [facility] ORS;  Service: General;  Laterality: N/A;   COLONOSCOPY WITH PROPOFOL N/A 06/23/2014   Procedure: COLONOSCOPY WITH PROPOFOL;  Surgeon: Lollie Sails, MD;  Location: [facility] ENDOSCOPY;  Service: Endoscopy;  Laterality: N/A;   ERCP N/A 07/16/2014   Procedure: ENDOSCOPIC RETROGRADE CHOLANGIOPANCREATOGRAPHY (ERCP);  Surgeon: Clarene Essex, MD;  Location: [facility] ENDOSCOPY;  Service: Endoscopy;  Laterality: N/A;   ERCP N/A 10/03/2014   Procedure: ENDOSCOPIC RETROGRADE CHOLANGIOPANCREATOGRAPHY (ERCP);  Surgeon: Hulen Luster, MD;  Location: [facility] ENDOSCOPY;  Service: Gastroenterology;  Laterality: N/A;   ESOPHAGOGASTRODUODENOSCOPY N/A 06/23/2014   Procedure: ESOPHAGOGASTRODUODENOSCOPY (EGD);  Surgeon: Lollie Sails, MD;  Location: [facility] ENDOSCOPY;  Service: Endoscopy;  Laterality: N/A;   ESOPHAGOGASTRODUODENOSCOPY N/A 06/30/2016   Procedure: ESOPHAGOGASTRODUODENOSCOPY (EGD);  Surgeon: Jonathon Bellows, MD;  Location: [facility] ENDOSCOPY;  Service: Endoscopy;  Laterality: N/A;   ESOPHAGOGASTRODUODENOSCOPY (EGD) WITH PROPOFOL N/A 12/01/2015   Procedure: ESOPHAGOGASTRODUODENOSCOPY (EGD) WITH PROPOFOL;  Surgeon: Lollie Sails, MD;  Location[facility] ENDOSCOPY;  Service: Endoscopy;  Laterality: N/A;   TUBAL LIGATION     WISDOM TOOTH EXTRACTION      Prior to Admission medications   Medication Sig Start Date End Date Taking? Authorizing Provider  albuterol (PROVENTIL HFA;VENTOLIN HFA) 108 (90 Base) MCG/ACT inhaler Inhale 2 puffs into the lungs every 6 (six) hours as needed for wheezing or shortness of breath.   Yes [provider]  ALPRAZolam Duanne Moron)  1 MG tablet Take 1 mg by mouth 3 (three) times daily as needed.    Yes [provider]  bumetanide (BUMEX) 1 MG tablet Take 3 mg by mouth 2 (two) times a day. 07/28/18 08/27/18 Yes [provider]  cholecalciferol (VITAMIN D) 1000 units tablet Take 2,000 Units by  mouth daily.   Yes [provider]  citalopram (CELEXA) 40 MG tablet Take 40 mg by mouth daily.  07/24/15  Yes [provider]  diclofenac sodium (VOLTAREN) 1 % GEL Apply 4 g topically every 8 (eight) hours as needed. Patient taking differently: Apply 4 g topically 2 (two) times daily.  12/06/16 08/20/18 Yes Milinda Pointer, MD  dicyclomine (BENTYL) 10 MG capsule Take 10 mg by mouth 2 (two) times daily. 07/23/18  Yes [provider]  fluticasone (FLONASE) 50 MCG/ACT nasal spray Place 1-2 sprays into both nostrils daily as needed for rhinitis.    Yes [provider]  Folate-B12-Intrinsic Factor (INTRINSI B12-FOLATE) 751-700-17 MCG-MCG-MG TABS Take 1 tablet by mouth daily.    Yes [provider]  gabapentin (NEURONTIN) 300 MG capsule Take 900 mg by mouth 3 (three) times daily.    Yes [provider]  insulin regular human CONCENTRATED (HUMULIN R) 500 UNIT/ML injection Inject 100-200 Units into the skin 2 (two) times a day. Inject 200 units in the morning, inject 100 units nightly. Based on glucose readings   Yes [provider]  lactulose (CHRONULAC) 10 GM/15ML solution Take 30 mLs (20 g total) by mouth 3 (three) times daily. Patient taking differently: Take 40 g by mouth 4 (four) times daily.  10/26/16  Yes Gouru, Illene Silver, MD  magnesium oxide (MAG-OX) 400 MG tablet Take 400 mg by mouth daily.   Yes [provider]  nystatin (MYCOSTATIN/NYSTOP) powder Apply 1 g topically 3 (three) times daily as needed. For irritation 10/26/16  Yes Gouru, Aruna, MD  Oxycodone HCl 10 MG TABS Take 10 mg by mouth 3 (three) times daily.    Yes [provider]  pantoprazole (PROTONIX) 40 MG tablet Take 40 mg by mouth 2 (two) times daily before a meal.   Yes [provider]  potassium chloride SA (K-DUR,KLOR-CON) 20 MEQ tablet Take 20 mEq by mouth daily as needed. For high blood pressure   Yes [provider]  promethazine  (PHENERGAN) 12.5 MG tablet Take 2 tablets (25 mg total) by mouth every 6 (six) hours as needed for nausea or vomiting. Patient taking differently: Take 12.5-25 mg by mouth every 6 (six) hours as needed for nausea or vomiting.  09/25/16  Yes Dustin Flock, MD  rifaximin (XIFAXAN) 550 MG TABS tablet Take 550 mg by mouth daily.    Yes [provider]  spironolactone (ALDACTONE) 100 MG tablet Take 400 mg by mouth daily.    Yes [provider]  vitamin B-12 (CYANOCOBALAMIN) 1000 MCG tablet Take 2,000 mcg by mouth daily.   Yes [provider]  SUMAtriptan (IMITREX) 50 MG tablet Take 50 mg by mouth every 2 (two) hours as needed. For migraines  03/17/11  [provider]    Allergies Hydrocodone-acetaminophen, Nsaids, Tape, and Vicodin [hydrocodone-acetaminophen]  Family History  Problem Relation Age of Onset   Lung cancer Mother    Ulcers Father    Emphysema Father    Heart disease Sister    Ulcers Sister    Heart disease Brother     Social History Social History   Tobacco Use   Smoking status: Never Smoker  Smokeless tobacco: Never Used  Substance Use Topics   Alcohol use: No    Comment: occ   Drug use: No    Review of Systems  Constitutional: Negative for fever. Eyes: Negative for visual changes. ENT: Negative for sore throat. Neck: No neck pain  Cardiovascular: Negative for chest pain. Respiratory: Negative for shortness of breath. Gastrointestinal: + abdominal pain and nausea. No vomiting or diarrhea. Genitourinary: Negative for dysuria. Musculoskeletal: Negative for back pain. Skin: Negative for rash. Neurological: Negative for headaches, weakness or numbness. Psych: No SI or HI  ____________________________________________   PHYSICAL EXAM:  VITAL SIGNS: ED Triage Vitals [08/20/18 2224]  Enc Vitals Group     BP 119/71     Pulse Rate 100     Resp 20     Temp 98.2 F (36.8 C)     Temp Source Oral     SpO2 98 %      Weight 246 lb 0.5 oz (111.6 kg)     Height 5' 2"  (1.575 m)     Head Circumference      Peak Flow      Pain Score 8     Pain Loc      Pain Edu?      Excl. in [place]?     Constitutional: Alert and oriented. Well appearing and in no apparent distress. HEENT:      Head: Normocephalic and atraumatic.         Eyes: Conjunctivae are normal. Sclera is non-icteric.       Mouth/Throat: Mucous membranes are moist.       Neck: Supple with no signs of meningismus. Cardiovascular: Regular rate and rhythm. No murmurs, gallops, or rubs. 2+ symmetrical distal pulses are present in all extremities. No JVD. Respiratory: Normal respiratory effort. Lungs are clear to auscultation bilaterally. No wheezes, crackles, or rhonchi.  Gastrointestinal: Obese, soft, mild tenderness to palpation over the suprapubic region, and non distended with positive bowel sounds. No rebound or guarding. Musculoskeletal: Nontender with normal range of motion in all extremities. No edema, cyanosis, or erythema of extremities. Neurologic: Normal speech and language. Face is symmetric. Moving all extremities. No gross focal neurologic deficits are appreciated. Skin: Skin is warm, dry and intact. No rash noted. Psychiatric: Mood and affect are normal. Speech and behavior are normal.  ____________________________________________   LABS (all labs ordered are listed, but only abnormal results are displayed)  Labs Reviewed  COMPREHENSIVE METABOLIC PANEL - Abnormal; Notable for the following components:      Result Value   Sodium 128 (*)    Chloride 95 (*)    Glucose, Bld 200 (*)    Calcium 8.2 (*)    Total Protein 5.4 (*)    Albumin 2.8 (*)    AST 42 (*)    All other components within normal limits  CBC - Abnormal; Notable for the following components:   Platelets 94 (*)    All other components within normal limits  URINALYSIS, COMPLETE (UACMP) WITH MICROSCOPIC - Abnormal; Notable for the following components:   Color, Urine  YELLOW (*)    APPearance HAZY (*)    Specific Gravity, Urine >1.046 (*)    Bacteria, UA MANY (*)    All other components within normal limits  URINE CULTURE  LIPASE, BLOOD   ____________________________________________  EKG  none    ____________________________________________  RADIOLOGY  I have personally reviewed the images performed during this visit and I agree with the Radiologist's read.   Interpretation by  Radiologist:  Ct Abdomen Pelvis W Contrast  Result Date: 08/21/2018 CLINICAL DATA:  Abdominal pain, discharged earlier today. History of liver disease with frequent paracentesis. Now with abdominal and back pain. EXAM: CT ABDOMEN AND PELVIS WITH CONTRAST TECHNIQUE: Multidetector CT imaging of the abdomen and pelvis was performed using the standard protocol following bolus administration of intravenous contrast. CONTRAST:  146m OMNIPAQUE IOHEXOL 300 MG/ML  SOLN COMPARISON:  CT abdomen pelvis August 20, 2018 FINDINGS: Lower chest: Small left pleural effusion with adjacent atelectatic change. Lung bases are otherwise clear. Normal heart size. No pericardial effusion. Small pericaval lipoma. Hepatobiliary: Diffusely nodular hepatic surface contour. Stable hypoattenuating lesions throughout the liver compatible with hepatic cysts. Patient is post cholecystectomy. There is pneumobilia within the common bile duct and left intrahepatic biliary tree, new from 1 day prior. Pancreas: Unremarkable. No pancreatic ductal dilatation or surrounding inflammatory changes. Spleen: Stable splenomegaly. No focal splenic lesions Adrenals/Urinary Tract: Normal adrenal glands. Some residual contrast media is seen within the renal collecting system. The kidneys enhance symmetrically. No focal renal abnormality. No urolithiasis. Excreted contrast media is retained within the urinary bladder. Stomach/Bowel: Diffuse thickening of the duodenal sweep. More distal small bowel is unremarkable. Enteric contrast  medium is retained within the colon. A normal appendix is visualized. Vascular/Lymphatic: Atherosclerotic plaque within the normal caliber aorta. Upper abdominal venous collateralization compatible with patient history of portal hypertension. No suspicious or enlarged lymph nodes in the included lymphatic chains. Reproductive: Uterus is surgically absent. No concerning adnexal lesions. Other: Moderate volume ascites. No free intraperitoneal gas. Circumferential body wall edema and features of anasarca. No bowel containing hernia. Musculoskeletal: No acute osseous abnormality or suspicious osseous lesion. Multilevel degenerative changes are present in the imaged portions of the spine. IMPRESSION: 1. 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 Recommend correlation with patient's procedural history. 2. Diffuse thickening of the duodenal sweep, nonspecific, and may reflect features of portal enteropathy though duodenitis could have a similar appearance. 3. Cirrhosis and portal hypertension as evidenced by splenomegaly and upper abdominal venous collateralization. 4. Small left pleural effusion with adjacent atelectatic change. 5. Aortic Atherosclerosis (ICD10-I70.0). Electronically Signed   By: PLovena LeM.D.   On: 08/21/2018 03:38   Ct Abdomen Pelvis W Contrast  Result Date: 08/20/2018 CLINICAL DATA:  Diffuse abdominal pain EXAM: CT ABDOMEN AND PELVIS WITH CONTRAST TECHNIQUE: Multidetector CT imaging of the abdomen and pelvis was performed using the standard protocol following bolus administration of intravenous contrast. CONTRAST:  1059mOMNIPAQUE IOHEXOL 300 MG/ML  SOLN COMPARISON:  07/24/2018 FINDINGS: Lower chest: Small left pleural effusion is noted. No focal infiltrate is noted. Hepatobiliary: Liver demonstrates cirrhotic  change with nodularity. Cysts are noted throughout the liver. The gallbladder has been surgically removed. Peri hepatic fluid is noted consistent with mild ascites. Pancreas: Unremarkable. No pancreatic ductal dilatation or surrounding inflammatory changes. Spleen: The spleen is enlarged consistent with a degree of portal hypertension. Adrenals/Urinary Tract: Adrenal glands are within normal limits. Kidneys demonstrate no renal calculi or obstructive changes. The bladder is partially distended. Stomach/Bowel: The appendix is within normal limits. No obstructive or inflammatory changes of the large or small bowel is seen. Vascular/Lymphatic: Aortic atherosclerosis. No enlarged abdominal or pelvic lymph nodes. Multiple abdominal varices are seen. Reproductive: Status post hysterectomy. No adnexal masses. Other: Mild ascites is noted. Mild changes of anasarca  are noted as well. Musculoskeletal: No acute bony abnormality is seen. IMPRESSION: Small left pleural effusion. Mild ascites although improved following recent paracentesis. Changes of portal hypertension and cirrhosis. Multiple varices are seen. Electronically Signed   By: Inez Catalina M.D.   On: 08/20/2018 13:20   US Paracentesis  Result Date: 08/20/2018 INDICATION: Cirrhosis and reaccumulation of symptomatic ascites. EXAM: ULTRASOUND GUIDED PARACENTESIS MEDICATIONS: None. COMPLICATIONS: None immediate. PROCEDURE: Informed written consent was obtained from the patient after a discussion of the risks, benefits and alternatives to treatment. A timeout was performed prior to the initiation of the procedure. Initial ultrasound was performed to localize ascites. The right lower abdomen was prepped and draped in the usual sterile fashion. 1% lidocaine was used for local anesthesia. Following this, a 6 Fr Safe-T-Centesis catheter was introduced. An ultrasound image was saved for documentation purposes. The paracentesis was performed. The catheter was removed and a  dressing was applied. The patient tolerated the procedure well without immediate post procedural complication. FINDINGS: A total of approximately 5 L of yellowish fluid was removed. IMPRESSION: Successful ultrasound-guided paracentesis yielding 5 liters of peritoneal fluid. Electronically Signed   By: Aletta Edouard M.D.   On: 08/20/2018 10:04   US Abdomen Limited Ruq  Result Date: 08/21/2018 CLINICAL DATA:  Pneumobilia EXAM: ULTRASOUND ABDOMEN LIMITED RIGHT UPPER QUADRANT COMPARISON:  Abdominal CT earlier today FINDINGS: Gallbladder: Surgically absent Common bile duct: Diameter: 5 mm. Shadowing echogenicity from pneumobilia by prior CT. No distinguishable biliary calculus. Liver: Nodular liver with heterogeneous echotexture. Simple cysts measuring 28 mm and 9 mm. Portal vein is patent on color Doppler imaging with normal direction of blood flow towards the liver. Other: Small volume ascites IMPRESSION: 1. Pneumobilia within the nondilated biliary tree.  Cholecystectomy. 2. Cirrhosis with small volume ascites. Electronically Signed   By: Monte Fantasia M.D.   On: 08/21/2018 05:46     ____________________________________________   PROCEDURES  Procedure(s) performed: yes ABDOMINAL PARACENTESIS  Date/Time: 08/21/2018 6:43 AM Performed by: Rudene Re, MD Authorized by: Rudene Re, MD  Consent: Verbal consent obtained. Risks and benefits: risks, benefits and alternatives were discussed Consent given by: patient Patient understanding: patient states understanding of the procedure being performed Site marked: the operative site was marked Imaging studies: imaging studies available Patient identity confirmed: verbally with patient Time out: Immediately prior to procedure a "time out" was called to verify the correct patient, procedure, equipment, support staff and site/side marked as required. Preparation: Patient was prepped and draped in the usual sterile fashion. Local  anesthesia used: yes Anesthesia: local infiltration  Anesthesia: Local anesthesia used: yes Local Anesthetic: lidocaine 1% without epinephrine Anesthetic total: 5 mL  Sedation: Patient sedated: no  Patient tolerance: patient tolerated the procedure well with no immediate complications Comments: [place] used, small amount of ascites seen. @ attempts were made with no success.     Critical Care performed:  None ____________________________________________   INITIAL IMPRESSION / ASSESSMENT AND PLAN / ED COURSE  53 y.o. 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  Patient is well-appearing in no distress, abdomen is soft and nondistended with mild suprapubic tenderness to palpation.  Site of paracentesis is well-appearing.  Due to recent paracentesis in the last 24 hours patient was sent for a CT scan to rule out any intra-abdominal complications.  CT is consistent with pneumobilia.  Patient status post cholecystectomy.  Has not undergone recent endoscopy.  Her labs are within patient's baseline with no signs of sepsis. RUQ [place] confirmed pneumobilia with no evidence of stone or gallstone ileus. 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  Discussed with Dr. Celine Ahr from surgery who agrees with this finding.  Patient underwent a large-volume paracentesis 24 hours ago with 5 L of ascites being removed.  Unfortunately the fluid was not sent for analysis.  I have attempted to do another paracentesis at this time however patient has very small amount of ascites and I was unable to obtain a sample.  I truly do not believe clinically the patient has peritonitis at this time with a very benign exam.  Now resolved abdominal pain.  No  fever, no white count.  Her pain is most likely chronic in nature for which patient is seen frequently here and and several other local emergency departments.  At this time patient is stable for discharge.  Has not required any IV pain medication.  Recommended follow-up with her liver doctor at [facility].  Discussed return precautions for worsening pain, fever, nausea or vomiting.       As part of my medical decision making, I reviewed the following data within the [place] notes reviewed and incorporated, Labs reviewed , EKG interpreted , Old chart reviewed, Radiograph reviewed , Notes from prior ED visits and [place] Controlled Substance Database   Patient was evaluated in Emergency Department today for the symptoms described in the history of present illness. Patient was evaluated in the context of the global COVID-19 pandemic, which necessitated consideration that the patient might be at risk for infection with the SARS-CoV-2 virus that causes COVID-19. Institutional protocols and algorithms that pertain to the evaluation of patients at risk for COVID-19 are in a state of rapid change based on information released by regulatory bodies including the CDC and federal and state organizations. These policies and algorithms were followed during the patient's care in the ED.   ____________________________________________   FINAL CLINICAL IMPRESSION(S) / ED DIAGNOSES   Final diagnoses:  Pneumobilia  Lower abdominal pain      NEW MEDICATIONS STARTED DURING THIS VISIT:  ED Discharge Orders    None       Note:  This document was prepared using Dragon voice recognition software and may include unintentional dictation errors.    Alfred Levins, [place], MD 08/21/18 [place], [place], MD 08/21/18 [phone]

## 2018-08-21 NOTE — Discharge Instructions (Addendum)

## 2018-08-21 NOTE — ED Notes (Signed)
Pt up to restroom with steady gait.

## 2018-08-21 NOTE — ED Notes (Signed)
ED Provider at bedside. 

## 2018-08-21 NOTE — ED Notes (Signed)
MD at the bedside  

## 2018-08-22 LAB — URINE CULTURE: Culture: NO GROWTH

## 2018-08-31 NOTE — Discharge Summary (Signed)
[facility] NAME: Geraldine Sandberg    MR#:  [id number]  DATE OF BIRTH:  01/13/1965  DATE OF ADMISSION:  08/19/2018 ADMITTING PHYSICIAN: Otila Back, MD  DATE OF DISCHARGE: 08/20/2018  3:45 PM  PRIMARY CARE PHYSICIAN: Ashkin, Neldon Labella, MD   ADMISSION DIAGNOSIS:  Hypokalemia [E87.6] SOB (shortness of breath) [R06.02] Other ascites [R18.8] Pleural effusion on left [J90] Ascites [R18.8]  DISCHARGE DIAGNOSIS:  Active Problems:   Ascites   SECONDARY DIAGNOSIS:   Past Medical History:  Diagnosis Date  . Abdominal abscess 08/25/2014  . Acid reflux 08/10/2010  . Acute cervical myofascial strain 02/09/2016  . Acute postoperative pain 08/09/2016  . Anxiety   . Ascites   . Asthma   . Back pain   . Bile leak, postoperative 07/17/2014  . Brittle bone disease   . Cancer ([place])    Uteriine  ca 52yr ago partial hysterectomy  . Cervical disc disease   . Chronic kidney disease   . Collagen vascular disease ([place]    RA  3-4 yrs ago  . COPD (chronic obstructive pulmonary disease) [place]   . Diabetes mellitus without complication ([facility]   . GERD (gastroesophageal reflux disease)   . Hypertension   . Hypothyroidism   . Left upper quadrant pain 01/09/2014  . Major depressive disorder with single episode 12/05/2011  . Major depressive disorder, single episode 12/05/2011  . Migraines   . NASH (nonalcoholic steatohepatitis)   . Respiratory infection    2/17  . [place] ([place] 09/18/2014  . Sleep apnea   . Sleep apnea   . Syncope 11/16/2014  . Thyroid disease   . TIA (transient ischemic attack)      ADMITTING HISTORY  HISTORY OF PRESENT ILLNESS:  DJanelli Welling is a 53y.o. 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  She presents to the emergency room with complaints of increased shortness of breath and fluid overload.  She is short of breath with  conversation as well as with minimal physical exertion such as getting dressed.  She reports a 12 to 14 pound weight gain since her last 4.5 L ascitic fluid removed 2 days ago on Friday, 08/17/2018.  She denies fevers or chills.  She denies chest pain.  She denies abdominal pain, however, she is experiencing discomfort and tightness.  She denies nausea or vomiting.  She denies diarrhea.  She denies hematemesis, hematochezia, or melena.  Labs on arrival with ammonia level 28, bilirubin is 1.5, potassium is 2.5, platelet count 97,000, INR is 1.4.  Chest x-ray demonstrates small left pleural effusion.  Rapid COVID-19 testing is negative.  We have admitted her to the hospitalist service for further management   HOSPITAL COURSE:   *Cirrhosis with recurrent ascites *Chronic abdominal pain *Hypokalemia *Diabetes mellitus *Depression *Chronic hyponatremia *Chronic thrombocytopenia  Patient admitted to medical floor.  IR was consulted for paracentesis.  Patient had large volume fluid removed.  Afebrile.  Abdominal pain improved but chronic abdominal pain persist.  Pain medications used.  Patient's hypokalemia improved with replacement.  Sliding scale insulin use for diabetes mellitus.  Patient was started on diuretics for her ascites.  She has follow-up with [facility].  Waiting for liver transplant.  Advise close follow-up.  Patient discharged home in stable condition.  CONSULTS OBTAINED:    DRUG ALLERGIES:   Allergies  Allergen Reactions  . Hydrocodone-Acetaminophen   .  Nsaids Other (See Comments)    Liver problems Liver problems   . Tape Swelling  . Vicodin [Hydrocodone-Acetaminophen] Itching and Rash    DISCHARGE MEDICATIONS:   Allergies as of 08/20/2018      Reactions   Hydrocodone-acetaminophen    Nsaids Other (See Comments)   Liver problems Liver problems   Tape Swelling   Vicodin [hydrocodone-acetaminophen] Itching, Rash      Medication List    TAKE these medications    albuterol 108 (90 Base) MCG/ACT inhaler Commonly known as: VENTOLIN HFA Inhale 2 puffs into the lungs every 6 (six) hours as needed for wheezing or shortness of breath.   ALPRAZolam 1 MG tablet Commonly known as: XANAX Take 1 mg by mouth 3 (three) times daily as needed.   cholecalciferol 1000 units tablet Commonly known as: VITAMIN D Take 2,000 Units by mouth daily.   citalopram 40 MG tablet Commonly known as: CELEXA Take 40 mg by mouth daily.   diclofenac sodium 1 % Gel Commonly known as: VOLTAREN Apply 4 g topically every 8 (eight) hours as needed. What changed: when to take this   dicyclomine 10 MG capsule Commonly known as: BENTYL Take 10 mg by mouth 2 (two) times daily.   fluticasone 50 MCG/ACT nasal spray Commonly known as: FLONASE Place 1-2 sprays into both nostrils daily as needed for rhinitis.   gabapentin 300 MG capsule Commonly known as: NEURONTIN Take 900 mg by mouth 3 (three) times daily.   insulin regular human CONCENTRATED 500 UNIT/ML injection Commonly known as: HUMULIN R Inject 100-200 Units into the skin 2 (two) times a day. Inject 200 units in the morning, inject 100 units nightly. Based on glucose readings   Intrinsi B12-Folate 800-500-20 MCG-MCG-MG Tabs Take 1 tablet by mouth daily.   lactulose 10 GM/15ML solution Commonly known as: CHRONULAC Take 30 mLs (20 g total) by mouth 3 (three) times daily. What changed:   how much to take  when to take this   magnesium oxide 400 MG tablet Commonly known as: MAG-OX Take 400 mg by mouth daily.   nystatin powder Commonly known as: MYCOSTATIN/NYSTOP Apply 1 g topically 3 (three) times daily as needed. For irritation   Oxycodone HCl 10 MG Tabs Take 10 mg by mouth 3 (three) times daily.   pantoprazole 40 MG tablet Commonly known as: PROTONIX Take 40 mg by mouth 2 (two) times daily before a meal.   potassium chloride SA 20 MEQ tablet Commonly known as: K-DUR Take 20 mEq by mouth daily as  needed. For high blood pressure   promethazine 12.5 MG tablet Commonly known as: PHENERGAN Take 2 tablets (25 mg total) by mouth every 6 (six) hours as needed for nausea or vomiting. What changed: how much to take   spironolactone 100 MG tablet Commonly known as: ALDACTONE Take 400 mg by mouth daily.   vitamin B-12 1000 MCG tablet Commonly known as: CYANOCOBALAMIN Take 2,000 mcg by mouth daily.   Xifaxan 550 MG Tabs tablet Generic drug: rifaximin Take 550 mg by mouth daily.     ASK your doctor about these medications   bumetanide 1 MG tablet Commonly known as: BUMEX Take 3 mg by mouth 2 (two) times a day. Ask about: Should I take this medication?       Today   VITAL SIGNS:  Blood pressure 108/71, pulse (!) 107, temperature 98.5 F (36.9 C), temperature source Oral, resp. rate (!) 21, height 5' 2"  (1.575 m), weight 111.6 kg, SpO2 98 %.  I/O:  No intake or output data in the 24 hours ending 08/31/18 1647  PHYSICAL EXAMINATION:  Physical Exam  GENERAL:  53 y.o.-year-old patient lying in the bed with no acute distress.  LUNGS: Normal breath sounds bilaterally, no wheezing, rales,rhonchi or crepitation. No use of accessory muscles of respiration.  CARDIOVASCULAR: S1, S2 normal. No murmurs, rubs, or gallops.  ABDOMEN: Soft, non-tender, non-distended. Bowel sounds present. No organomegaly or mass.  NEUROLOGIC: Moves all 4 extremities. PSYCHIATRIC: The patient is alert and oriented x 3.  SKIN: No obvious rash, lesion, or ulcer.   DATA REVIEW:   CBC No results for input(s): WBC, HGB, HCT, PLT in the last 168 hours.  Chemistries  No results for input(s): NA, K, CL, CO2, GLUCOSE, BUN, CREATININE, CALCIUM, MG, AST, ALT, ALKPHOS, BILITOT in the last 168 hours.  Invalid input(s): GFRCGP  Cardiac Enzymes No results for input(s): TROPONINI in the last 168 hours.  Microbiology Results  Results for orders placed or performed during the hospital encounter of 08/19/18   SARS Coronavirus 2 [facility] order, Performed in [facility] hospital lab) Nasopharyngeal Nasopharyngeal Swab     Status: None   Collection Time: 08/19/18  5:55 PM   Specimen: Nasopharyngeal Swab  Result Value Ref Range Status   SARS Coronavirus 2 NEGATIVE NEGATIVE Final    Comment: (NOTE) If result is NEGATIVE SARS-CoV-2 target nucleic acids are NOT DETECTED. The SARS-CoV-2 RNA is generally detectable in upper and lower  respiratory specimens during the acute phase of infection. The lowest  concentration of SARS-CoV-2 viral copies this assay can detect is 250  copies / mL. A negative result does not preclude SARS-CoV-2 infection  and should not be used as the sole basis for treatment or other  patient management decisions.  A negative result may occur with  improper specimen collection / handling, submission of specimen other  than nasopharyngeal swab, presence of viral mutation(s) within the  areas targeted by this assay, and inadequate number of viral copies  (<250 copies / mL). A negative result must be combined with clinical  observations, patient history, and epidemiological information. If result is POSITIVE SARS-CoV-2 target nucleic acids are DETECTED. The SARS-CoV-2 RNA is generally detectable in upper and lower  respiratory specimens dur ing the acute phase of infection.  Positive  results are indicative of active infection with SARS-CoV-2.  Clinical  correlation with patient history and other diagnostic information is  necessary to determine patient infection status.  Positive results do  not rule out bacterial infection or co-infection with other viruses. If result is PRESUMPTIVE POSTIVE SARS-CoV-2 nucleic acids MAY BE PRESENT.   A presumptive positive result was obtained on the submitted specimen  and confirmed on repeat testing.  While 2019 novel coronavirus  (SARS-CoV-2) nucleic acids may be present in the submitted sample  additional confirmatory testing may be  necessary for epidemiological  and / or clinical management purposes  to differentiate between  SARS-CoV-2 and other Sarbecovirus currently known to infect humans.  If clinically indicated additional testing with an alternate test  methodology [phone]) is advised. The SARS-CoV-2 RNA is generally  detectable in upper and lower respiratory sp ecimens during the acute  phase of infection. The expected result is Negative. Fact Sheet for Patients:  StrictlyIdeas.no Fact Sheet for Healthcare Providers: BankingDealers.co.za This test is not yet approved or cleared by the [place] FDA and has been authorized for detection and/or diagnosis of SARS-CoV-2 by FDA under an Emergency Use Authorization (EUA).  This EUA  will remain in effect (meaning this test can be used) for the duration of the COVID-19 declaration under Section 564(b)(1) of the Act, 21 U.S.C. section 360bbb-3(b)(1), unless the authorization is terminated or revoked sooner. Performed at [facility], [address] [place], [place] [address]     RADIOLOGY:  No results found.  Follow up with PCP in 1 week.  Management plans discussed with the patient, family and they are in agreement.  CODE STATUS:  Code Status History    Date Active Date Inactive Code Status Order ID Comments User Context   08/19/2018 1922 08/20/2018 1851 Full Code [id number]  Mayer Camel, NP ED   07/28/2018 2340 07/30/2018 1725 Full Code [id number]  Lance Coon, MD Inpatient   06/11/2017 2157 06/13/2017 1914 DNR [id number]  Oswald Hillock, MD ED   12/20/2016 2227 12/22/2016 1808 Full Code [id number]  Lance Coon, MD Inpatient   10/21/2016 1929 10/26/2016 1824 Full Code [id number]  Epifanio Lesches, MD ED   09/23/2016 1915 09/25/2016 1746 Full Code [id number]  Demetrios Loll, MD Inpatient   06/28/2016 2122 06/30/2016 1909 Full Code [id number]  Henreitta Leber, MD Inpatient   03/12/2015 0009 03/13/2015 1723  Full Code [id number]  Gladstone Lighter, MD Inpatient   10/04/2014 1259 10/06/2014 2020 Full Code [id number]  Baxter Hire, MD Inpatient   09/05/2014 0229 09/08/2014 1810 Full Code [id number]  Lance Coon, MD Inpatient   08/25/2014 1823 08/30/2014 1348 Full Code [id number]  Florene Glen, MD ED   08/03/2014 1712 08/08/2014 1516 Full Code [id number]  Marlyce Huge, MD ED   07/24/2014 1845 07/30/2014 1731 Full Code [id number]  Florene Glen, MD Inpatient   07/23/2014 1142 07/24/2014 0327 Full Code [id number]  Sabino Dick, MD HOV   07/12/2014 2325 07/16/2014 1630 Full Code [id number]  Lance Coon, MD Inpatient   Advance Care Planning Activity      TOTAL TIME TAKING CARE OF THIS PATIENT ON DAY OF DISCHARGE: more than 30 minutes.   Leia Alf Akyra Bouchie M.D on 08/31/2018 at 4:47 PM  Between 7am to 6pm - Pager - [phone]  After 6pm go to [email or web address] - password [email or web address] [facility]  Office  [phone]  CC: Primary care physician; Ashkin, Neldon Labella, MD  Note: This dictation was prepared with Dragon dictation along with smaller phrase technology. Any transcriptional errors that result from this process are unintentional.

## 2018-09-12 IMAGING — CR DG CHEST 2V
1 series · 2 of 2 positions shown · non-contrast
Comparison: 03/13/2017

CLINICAL DATA: Patient reports syncopal episode today. Reports SOB
since episode. Denies CP, cough or fever at this time. Hx TIA, HTN,
GERD, DM, COPD, asthma, ascites. Non-smoker.

EXAM:
CHEST - 2 VIEW

[Series 1: dg chest 2 view · 0.14mm/px · 2 of 2 slices shown]
[im 1/2]
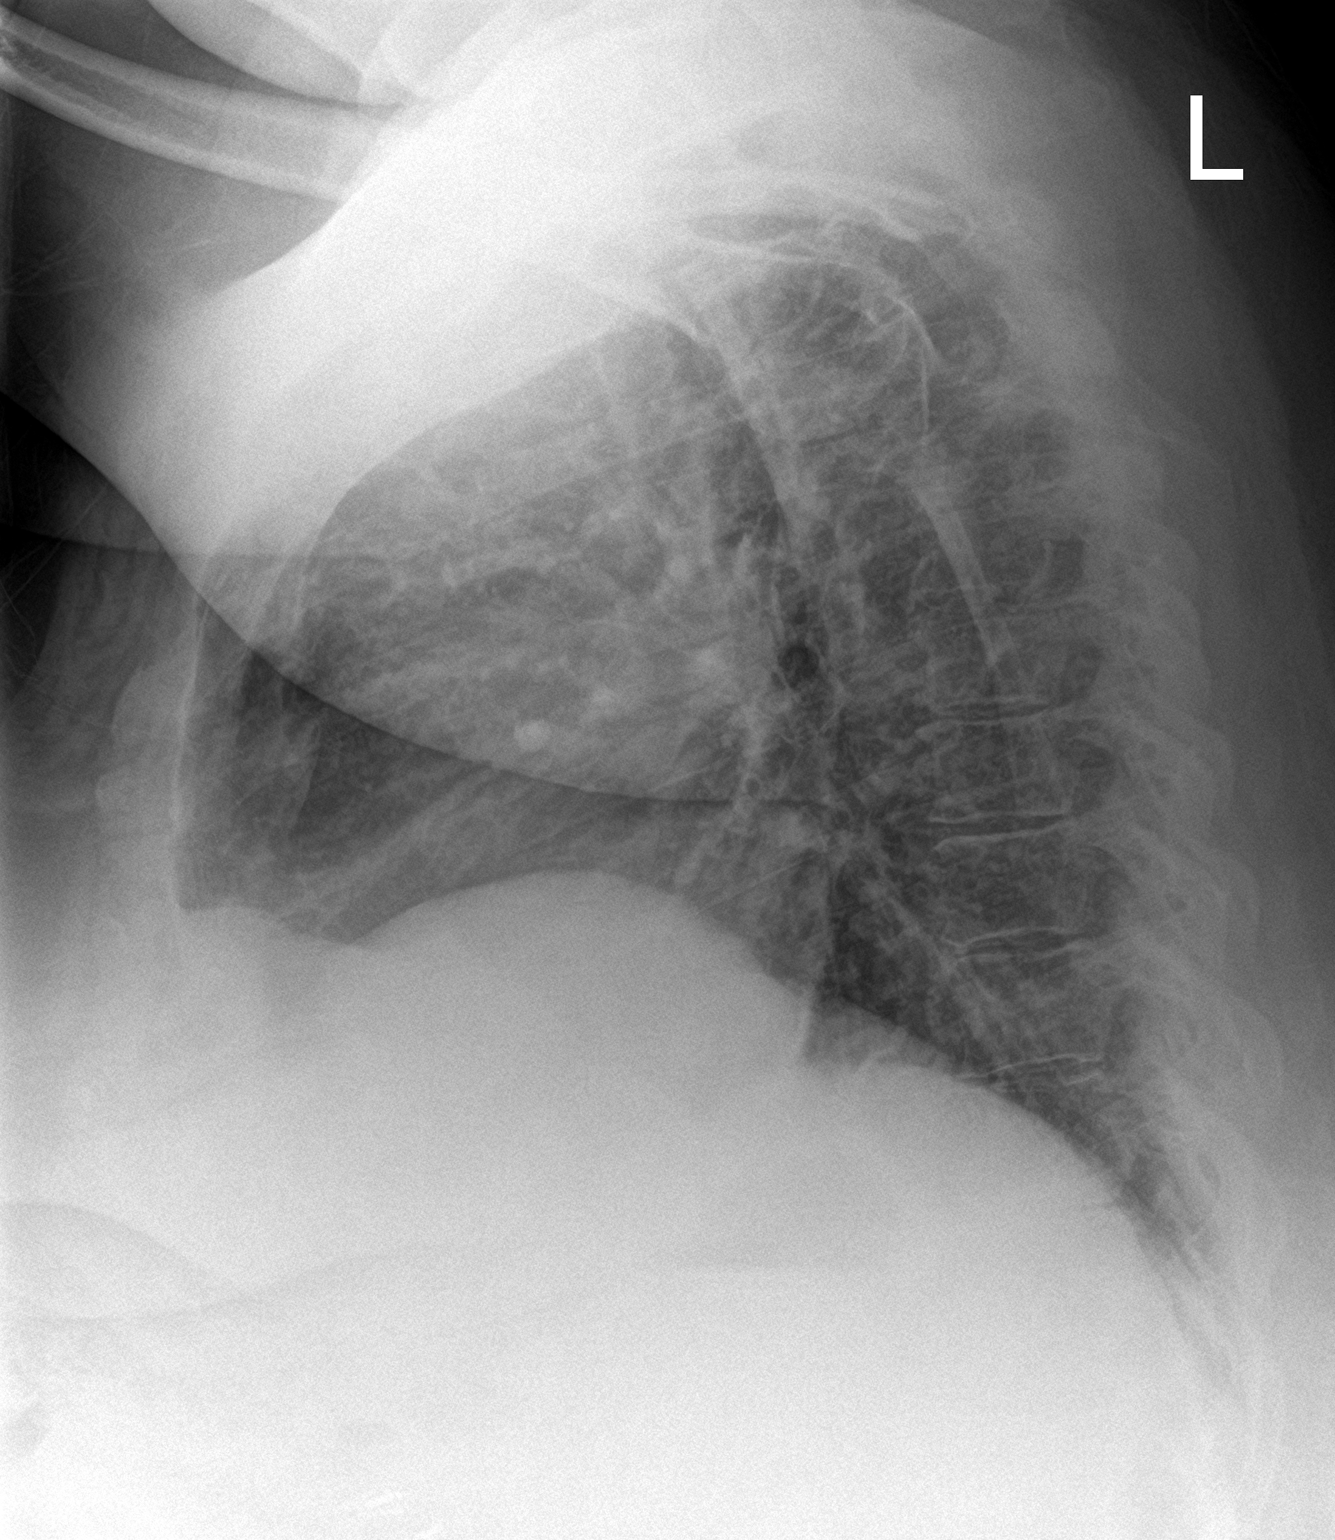
[im 2/2]
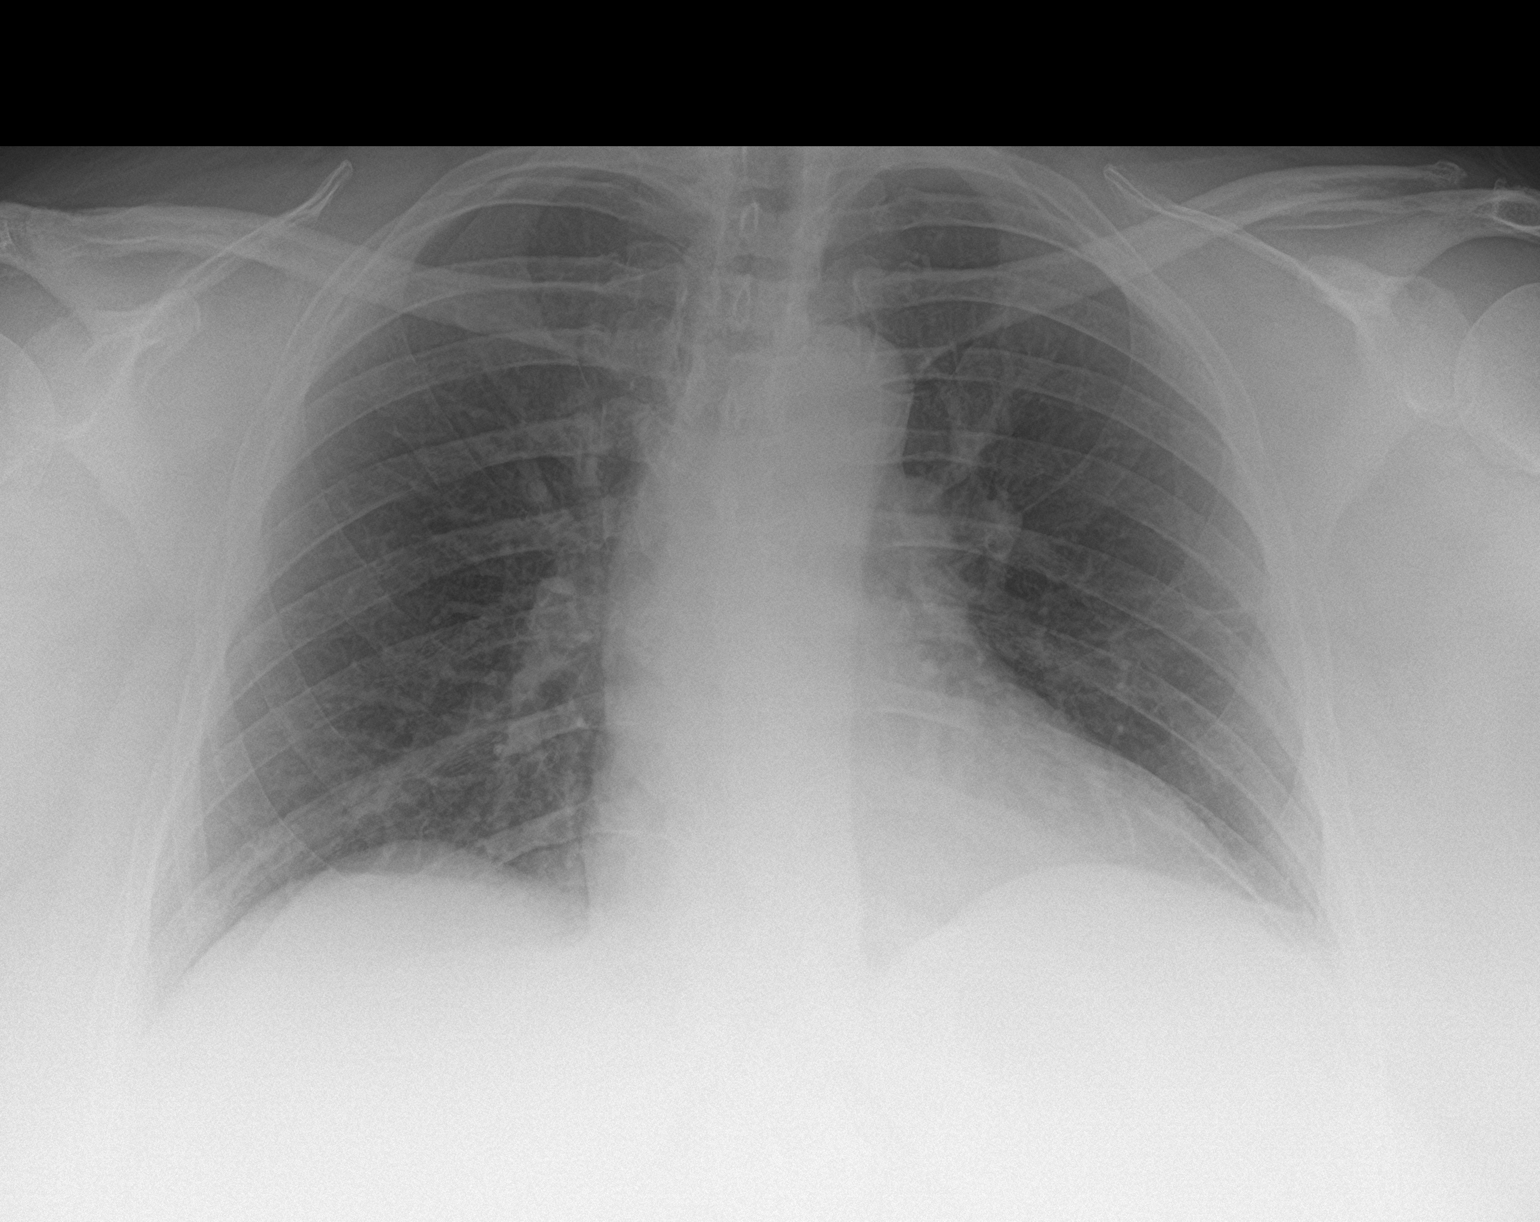

[2 of 2 positions shown; findings below may reference images not displayed]

FINDINGS: Cardiac silhouette is normal in size and configuration. No
mediastinal or hilar masses. No evidence of adenopathy.

Clear lungs.

No pleural effusion or pneumothorax.

Skeletal structures are intact.
IMPRESSION: No active cardiopulmonary disease.

## 2018-09-12 IMAGING — CR DG FOOT COMPLETE 3+V*L*
3 series · 3 of 3 positions shown · non-contrast
Comparison: None.

CLINICAL DATA: Twisted left foot.

EXAM:
LEFT FOOT - COMPLETE 3+ VIEW

[foot ap]
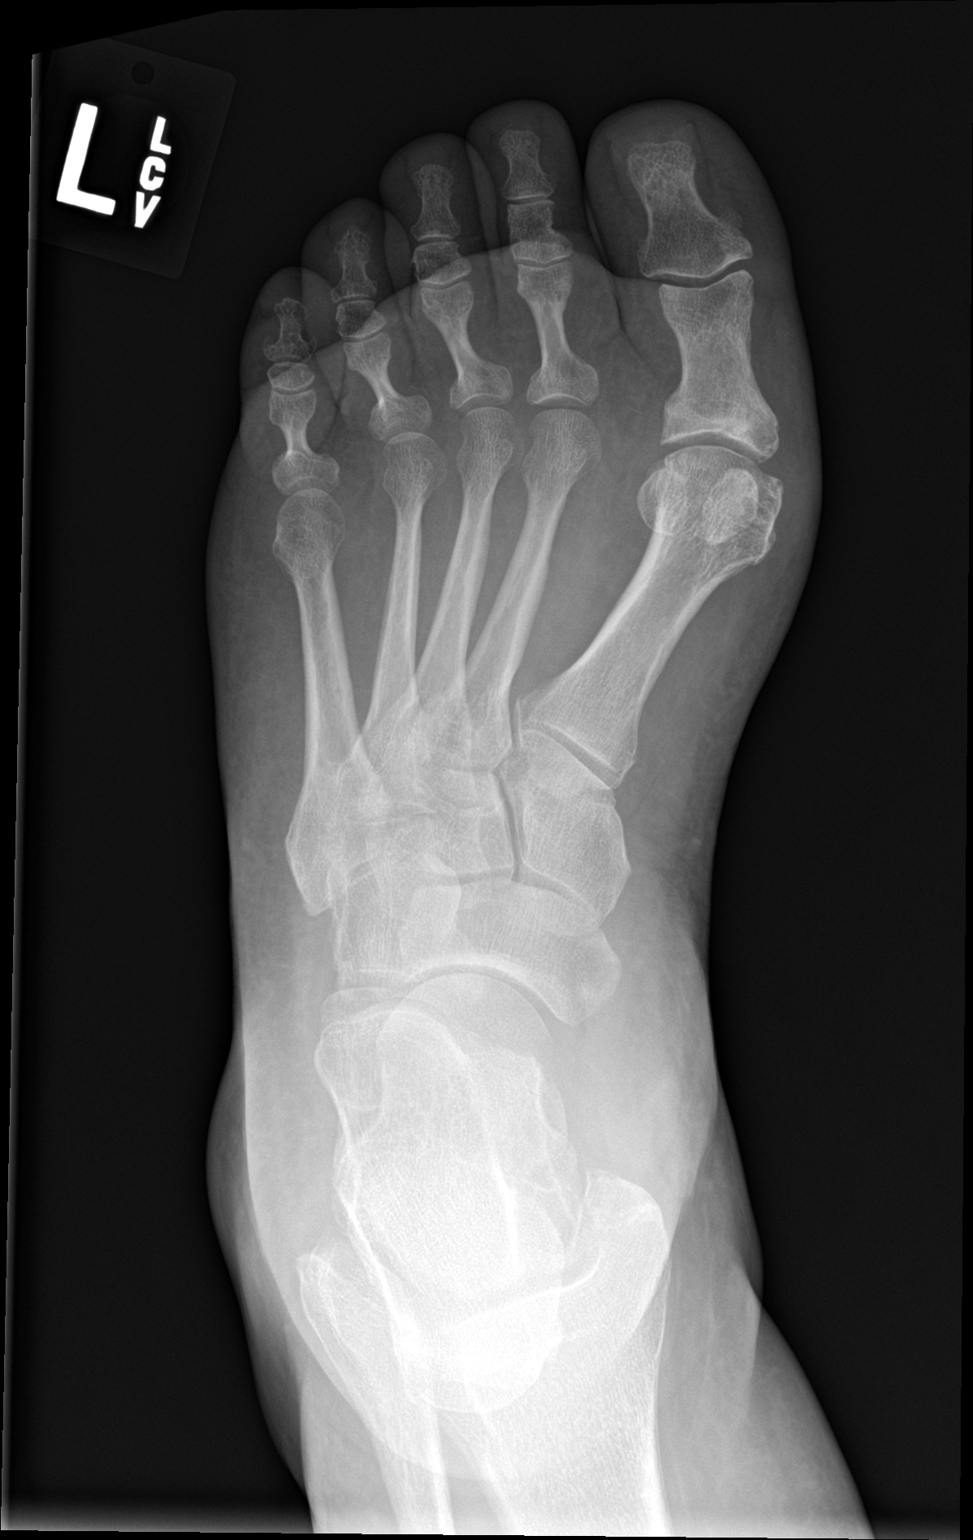

[foot obl]
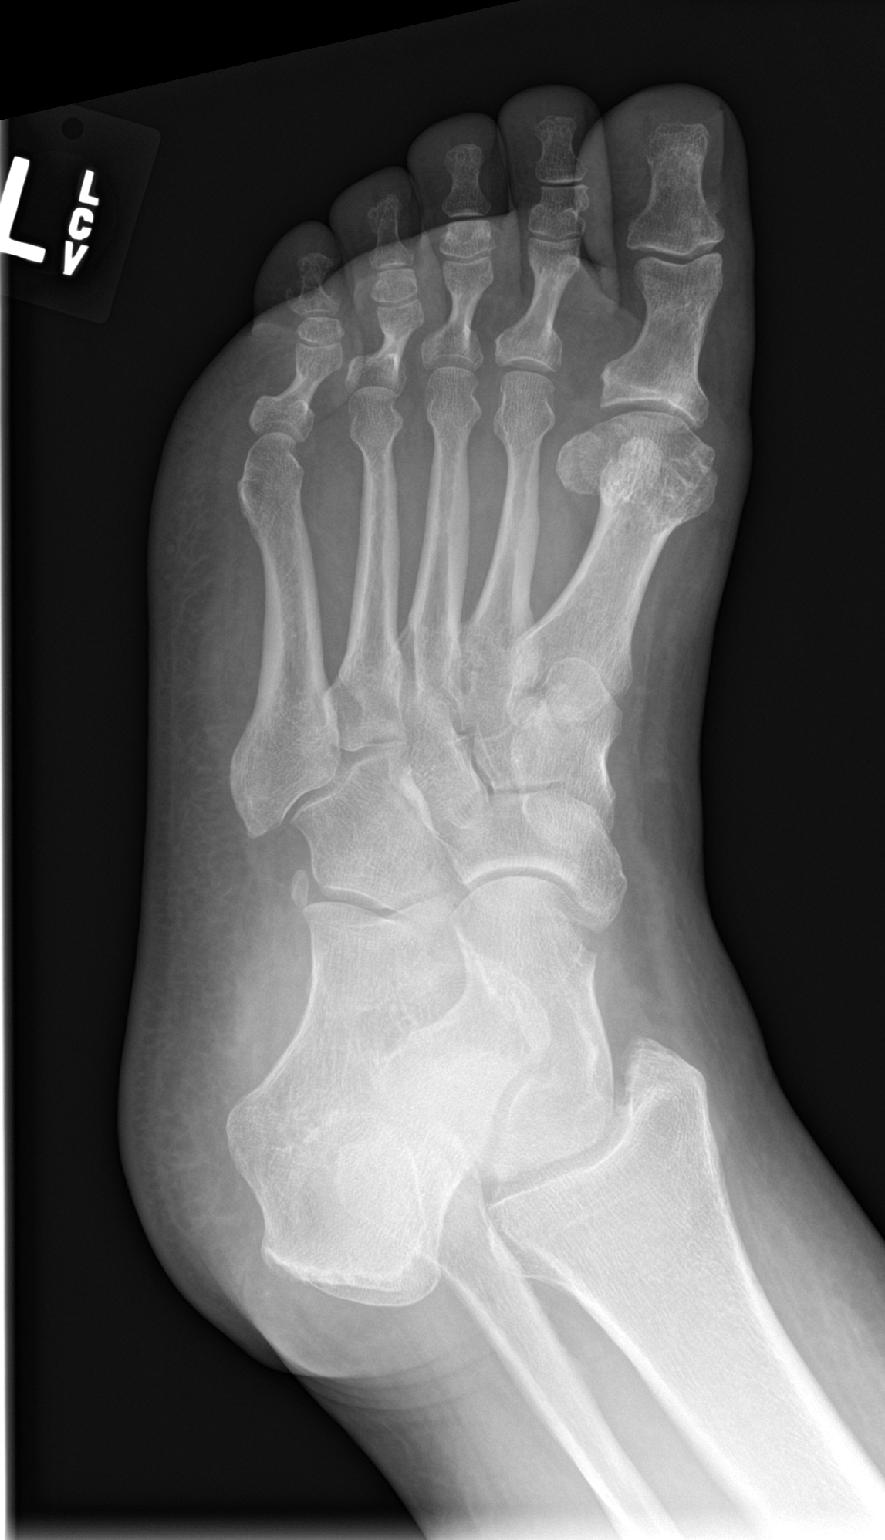

[foot lat]
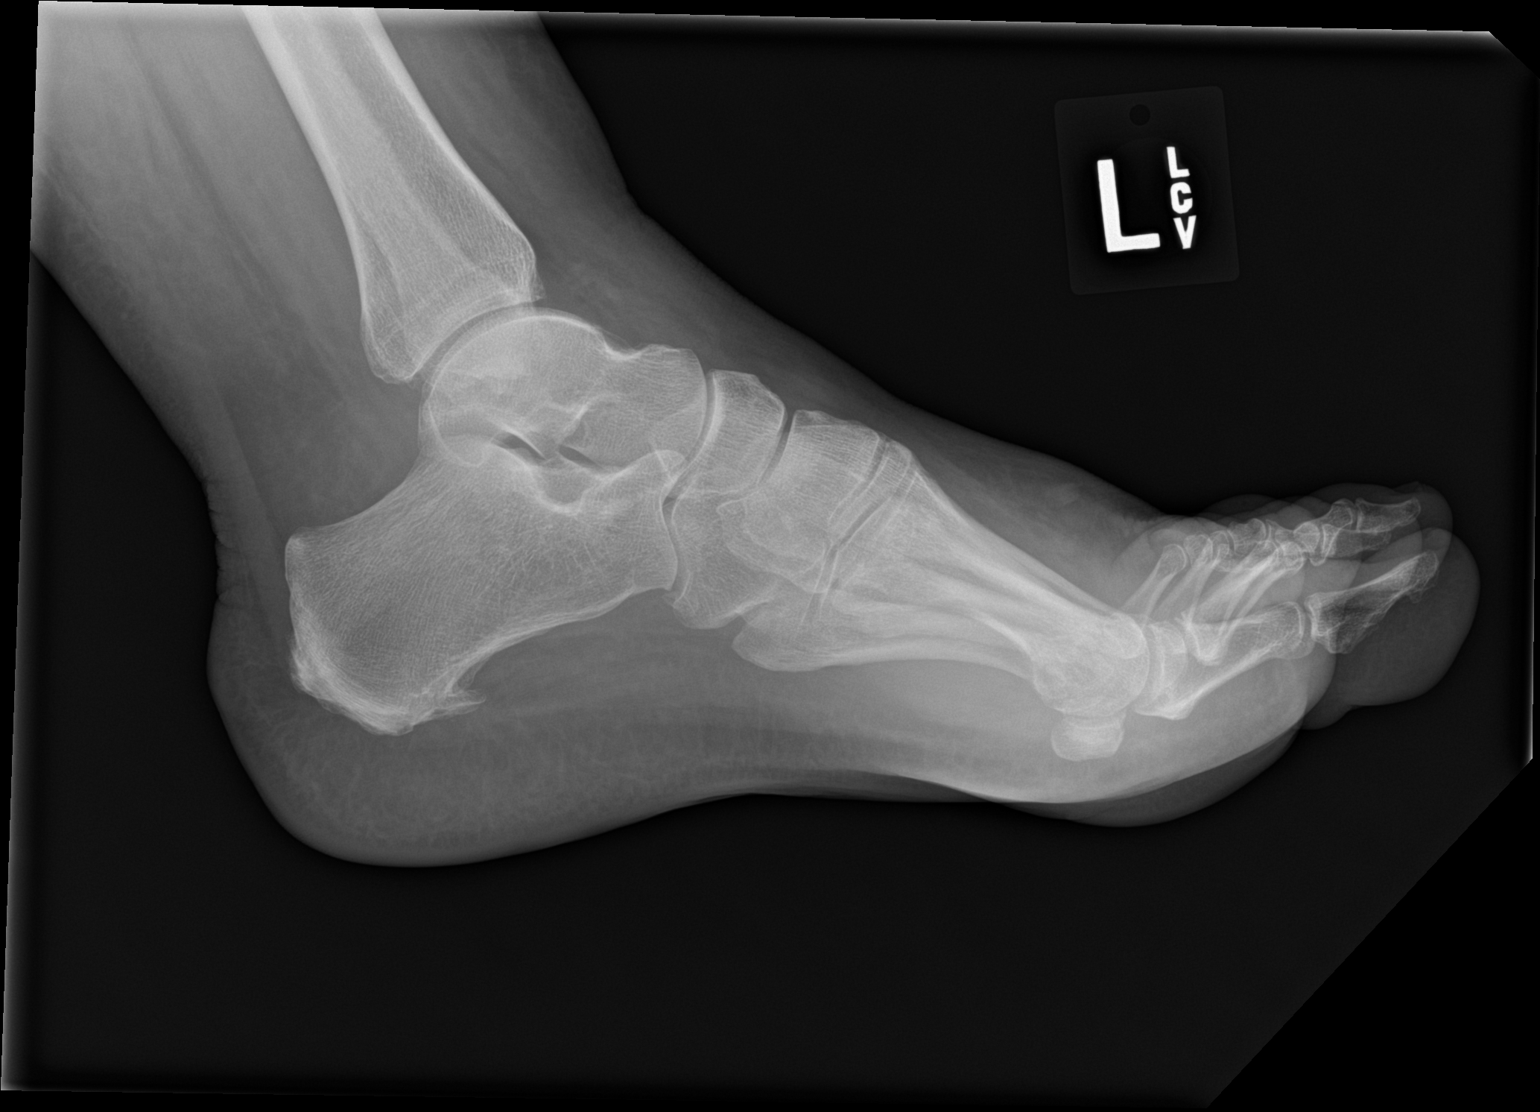

[3 of 3 positions shown; findings below may reference images not displayed]

FINDINGS: The joint spaces are maintained. No acute fracture is identified.
Mild degenerative changes at the first MTP joint and mild hallux
valgus deformity. Mild Abimelk Tiger and moderate size calcaneal heel
spur.
IMPRESSION: No acute fracture.

## 2018-09-20 ENCOUNTER — Encounter: Payer: Self-pay | Admitting: Emergency Medicine

## 2018-09-20 ENCOUNTER — Other Ambulatory Visit: Payer: Self-pay

## 2018-09-20 ENCOUNTER — Emergency Department
Admission: EM | Admit: 2018-09-20 | Discharge: 2018-09-20 | Disposition: A | Discharge status: Home / Self Care | Payer: Medicaid - Out of State | Attending: Emergency Medicine | Admitting: Emergency Medicine

## 2018-09-20 DIAGNOSIS — Z8542 Personal history of malignant neoplasm of other parts of uterus: Secondary | ICD-10-CM | POA: Insufficient documentation

## 2018-09-20 DIAGNOSIS — I5030 Unspecified diastolic (congestive) heart failure: Secondary | ICD-10-CM | POA: Insufficient documentation

## 2018-09-20 DIAGNOSIS — E1165 Type 2 diabetes mellitus with hyperglycemia: Secondary | ICD-10-CM | POA: Insufficient documentation

## 2018-09-20 DIAGNOSIS — Z794 Long term (current) use of insulin: Secondary | ICD-10-CM | POA: Diagnosis not present

## 2018-09-20 DIAGNOSIS — E039 Hypothyroidism, unspecified: Secondary | ICD-10-CM | POA: Insufficient documentation

## 2018-09-20 DIAGNOSIS — E1122 Type 2 diabetes mellitus with diabetic chronic kidney disease: Secondary | ICD-10-CM | POA: Insufficient documentation

## 2018-09-20 DIAGNOSIS — R739 Hyperglycemia, unspecified: Secondary | ICD-10-CM | POA: Diagnosis present

## 2018-09-20 DIAGNOSIS — I130 Hypertensive heart and chronic kidney disease with heart failure and stage 1 through stage 4 chronic kidney disease, or unspecified chronic kidney disease: Secondary | ICD-10-CM | POA: Insufficient documentation

## 2018-09-20 DIAGNOSIS — Z79899 Other long term (current) drug therapy: Secondary | ICD-10-CM | POA: Insufficient documentation

## 2018-09-20 DIAGNOSIS — N189 Chronic kidney disease, unspecified: Secondary | ICD-10-CM | POA: Diagnosis not present

## 2018-09-20 DIAGNOSIS — J45909 Unspecified asthma, uncomplicated: Secondary | ICD-10-CM | POA: Diagnosis not present

## 2018-09-20 LAB — URINALYSIS, COMPLETE (UACMP) WITH MICROSCOPIC
Bilirubin Urine: NEGATIVE
Glucose, UA: 150 mg/dL — AB
Hgb urine dipstick: NEGATIVE
Ketones, ur: NEGATIVE mg/dL
Leukocytes,Ua: NEGATIVE
Nitrite: NEGATIVE
Protein, ur: NEGATIVE mg/dL
Specific Gravity, Urine: 1.011 (ref 1.005–1.030)
pH: 6 (ref 5.0–8.0)

## 2018-09-20 LAB — COMPREHENSIVE METABOLIC PANEL
ALT: 31 U/L (ref 0–44)
AST: 40 U/L (ref 15–41)
Albumin: 3.4 g/dL — ABNORMAL LOW (ref 3.5–5.0)
Alkaline Phosphatase: 173 U/L — ABNORMAL HIGH (ref 38–126)
Anion gap: 11 (ref 5–15)
BUN: 13 mg/dL (ref 6–20)
CO2: 26 mmol/L (ref 22–32)
Calcium: 9 mg/dL (ref 8.9–10.3)
Chloride: 93 mmol/L — ABNORMAL LOW (ref 98–111)
Creatinine, Ser: 0.8 mg/dL (ref 0.44–1.00)
GFR calc Af Amer: >60 mL/min (ref >60)
GFR calc non Af Amer: >60 mL/min (ref >60)
Glucose, Bld: 317 mg/dL — ABNORMAL HIGH (ref 70–99)
Potassium: 3.7 mmol/L (ref 3.5–5.1)
Sodium: 130 mmol/L — ABNORMAL LOW (ref 135–145)
Total Bilirubin: 1.7 mg/dL — ABNORMAL HIGH (ref 0.3–1.2)
Total Protein: 6.7 g/dL (ref 6.5–8.1)

## 2018-09-20 LAB — CBC WITH DIFFERENTIAL/PLATELET
Abs Immature Granulocytes: 0.07 10*3/uL (ref 0.00–0.07)
Basophils Absolute: 0 10*3/uL (ref 0.0–0.1)
Basophils Relative: 1 %
Eosinophils Absolute: 0.1 10*3/uL (ref 0.0–0.5)
Eosinophils Relative: 1 %
HCT: 35.9 % — ABNORMAL LOW (ref 36.0–46.0)
Hemoglobin: 12.9 g/dL (ref 12.0–15.0)
Immature Granulocytes: 1 %
Lymphocytes Relative: 8 %
Lymphs Abs: 0.6 10*3/uL — ABNORMAL LOW (ref 0.7–4.0)
MCH: 33.2 pg (ref 26.0–34.0)
MCHC: 35.9 g/dL (ref 30.0–36.0)
MCV: 92.5 fL (ref 80.0–100.0)
Monocytes Absolute: 0.8 10*3/uL (ref 0.1–1.0)
Monocytes Relative: 10 %
Neutro Abs: 6 10*3/uL (ref 1.7–7.7)
Neutrophils Relative %: 79 %
Platelets: 94 10*3/uL — ABNORMAL LOW (ref 150–400)
RBC: 3.88 MIL/uL (ref 3.87–5.11)
RDW: 14.1 % (ref 11.5–15.5)
WBC: 7.5 10*3/uL (ref 4.0–10.5)
nRBC: 0 % (ref 0.0–0.2)

## 2018-09-20 LAB — GLUCOSE, CAPILLARY
Glucose-Capillary: 177 mg/dL — ABNORMAL HIGH (ref 70–99)
Glucose-Capillary: 302 mg/dL — ABNORMAL HIGH (ref 70–99)

## 2018-09-20 MED ORDER — HYDROXYZINE HCL 25 MG PO TABS
50.0000 mg | ORAL_TABLET | Freq: Once | ORAL | Status: AC
  Administered 2018-09-20: 50 mg via ORAL
  Filled 2018-09-20: qty 2

## 2018-09-20 NOTE — ED Provider Notes (Signed)
[facility] Emergency Department Provider Note ___   First MD Initiated Contact with Patient 09/20/18 0303     (approximate)  I have reviewed the triage vital signs and the nursing notes.   HISTORY  Chief Complaint Hyperglycemia   HPI Lori Mckenzie is a 53 y.o. female with below list of previous medical conditions presents to the emergency department secondary to hyperglycemia at home.  Patient states her Leukos was 459 at home.  Patient states that she took 20 mg of Lantus call the nurse phone line who instructed her to present to the emergency department.  Patient denies any recent illness.  Patient denies any vomiting diarrhea constipation fever or cough.  Patient denies any recent prednisone use.  Patient states that her glucose has been running higher than normal since she was switched from Humalog R to Lantus.       Past Medical History:  Diagnosis Date   Abdominal abscess 08/25/2014   Acid reflux 08/10/2010   Acute cervical myofascial strain 02/09/2016   Acute postoperative pain 08/09/2016   Anxiety    Ascites    Asthma    Back pain    Bile leak, postoperative 07/17/2014   Brittle bone disease    Cancer ([place])    Uteriine  ca 72yr ago partial hysterectomy   Cervical disc disease    Chronic kidney disease    Collagen vascular disease ([place]    RA  3-4 yrs ago   COPD (chronic obstructive pulmonary disease) ([place]    Diabetes mellitus without complication (HCC)    GERD (gastroesophageal reflux disease)    Hypertension    Hypothyroidism    Left upper quadrant pain 01/09/2014   Major depressive disorder with single episode 12/05/2011   Major depressive disorder, single episode 12/05/2011   Migraines    NASH (nonalcoholic steatohepatitis)    Respiratory infection    2/17   Shock ([place] 09/18/2014   Sleep apnea    Sleep apnea    Syncope 11/16/2014   Thyroid disease    TIA (transient ischemic attack)     Patient Active  Problem List   Diagnosis Date Noted   UTI (urinary tract infection) 06/11/2017   Neurogenic pain 02/13/2017   Chest pain 12/20/2016   Chronic feet pain (Secondary source of pain) (Bilateral) (R>L) 10/26/2016   Acute hepatic encephalopathy 09/23/2016   Low back pain due to L1-2 disc extrusion (caudal) (Left) 07/11/2016   Abdominal pain 06/28/2016   Constipation 06/20/2016   Hyperglycemia 06/20/2016   Nausea without vomiting 06/06/2016   Chronic lower extremity pain (Bilateral) 05/19/2016   Spinal stenosis, thoracic region (T10-11) 05/19/2016   Diabetes mellitus, insulin dependent (IDDM), uncontrolled ([place] 04/12/2016   Chronic sacroiliac joint pain (Bilateral) (L>R) 03/23/2016   Chronic upper extremity pain (Left) 03/10/2016   Chronic radicular cervical pain (L) 03/10/2016   Osteoarthritis 02/24/2016   Allodynia 02/09/2016   Chronic pain syndrome 01/07/2016   Opiate withdrawal ([place] 12/22/2015   Elevated liver enzymes 09/28/2015   Cirrhosis of liver with ascites ([place] 09/24/2015   Cirrhosis ([place] 09/24/2015   Altered mental status 07/24/2015   Uncontrolled diabetes mellitus ([place] 07/24/2015   Radicular pain of thoracic region 04/06/2015   Hepatic encephalopathy ([place] 03/11/2015   Elevated sedimentation rate 03/05/2015   Elevated C-reactive protein (CRP) 03/05/2015   Lumbar facet syndrome (Bilateral) (R>L) 03/05/2015   Cervical spondylosis 03/05/2015   Lumbar spondylosis 03/05/2015   Encounter for chronic pain management 03/05/2015   Chronic shoulder pain (Bilateral) (  R>L) 03/05/2015   Chronic carpal tunnel syndrome (Bilateral) 03/05/2015   Chronic hip pain (Bilateral) (L>R) 03/05/2015   Chronic upper back pain (Bilateral) (L>R) 03/05/2015   Osteoporosis, idiopathic 03/05/2015   Abnormal MRI, lumbar spine (02/03/2015) 03/05/2015   Thoracic radiculitis 02/05/2015   Vitamin D deficiency 01/13/2015   B12 deficiency 01/13/2015   Folate  deficiency 01/13/2015   Subacute lumbar radiculopathy (left side) (S1 dermatome) 12/10/2014   Drowsiness 11/16/2014   Episode of syncope 11/16/2014   Somnolence 11/16/2014   Opiate use (75 MME/Day) 10/28/2014   Long term prescription opiate use 10/28/2014   Long term current use of opiate analgesic 10/28/2014   Encounter for therapeutic drug level monitoring 10/28/2014   Chronic epigastric abdominal pain 10/28/2014   Chronic low back pain (Primary Source of Pain) (Bilateral) (R>L) 10/28/2014   Chronic neck pain [facility] source of pain) (Bilateral) (R>L) 10/28/2014   Ascites 09/05/2014   NASH (nonalcoholic steatohepatitis) 09/05/2014   Hypokalemia 09/05/2014   Dysthymia 08/05/2014   Other social stressor 08/05/2014   Steatohepatitis 07/15/2014   Type 2 diabetes mellitus ([place]) 07/12/2014   COPD with acute exacerbation ([place]) 07/12/2014   GERD (gastroesophageal reflux disease) 07/12/2014   OSA on CPAP 07/12/2014   Anxiety 07/12/2014   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, L1-2, & L4-5) 03/26/2014   Lumbar and sacral osteoarthritis 03/26/2014   Myofascial pain 03/26/2014   Lumbar spinal stenosis 03/26/2014   Lumbosacral spondylosis without myelopathy 03/26/2014   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   Lumbar central spinal stenosis (L1-2 and L4-5) 03/26/2014   Spondylosis of lumbar region without myelopathy or radiculopathy 03/26/2014   Breath shortness 41/74/0814   Diastolic dysfunction with chronic heart failure ([place]) 08/06/2013   Airway hyperreactivity 08/06/2013   Essential (primary) hypertension 08/06/2013   Asthma 08/06/2013   Clinical depression 12/05/2011    Past Surgical History:  Procedure Laterality Date   ABDOMINAL HYSTERECTOMY     CHOLECYSTECTOMY N/A 07/15/2014   Procedure: LAPAROSCOPIC CHOLECYSTECTOMY with liver biopsy ;  Surgeon: Sherri Rad, MD;  Location: [facility] ORS;  Service: General;  Laterality: N/A;   COLONOSCOPY WITH PROPOFOL N/A  06/23/2014   Procedure: COLONOSCOPY WITH PROPOFOL;  Surgeon: Lollie Sails, MD;  Location[facility] ENDOSCOPY;  Service: Endoscopy;  Laterality: N/A;   ERCP N/A 07/16/2014   Procedure: ENDOSCOPIC RETROGRADE CHOLANGIOPANCREATOGRAPHY (ERCP);  Surgeon: Clarene Essex, MD;  Location: [facility] ENDOSCOPY;  Service: Endoscopy;  Laterality: N/A;   ERCP N/A 10/03/2014   Procedure: ENDOSCOPIC RETROGRADE CHOLANGIOPANCREATOGRAPHY (ERCP);  Surgeon: Hulen Luster, MD;  Location: [facility] ENDOSCOPY;  Service: Gastroenterology;  Laterality: N/A;   ESOPHAGOGASTRODUODENOSCOPY N/A 06/23/2014   Procedure: ESOPHAGOGASTRODUODENOSCOPY (EGD);  Surgeon: Lollie Sails, MD;  Location: [facility] ENDOSCOPY;  Service: Endoscopy;  Laterality: N/A;   ESOPHAGOGASTRODUODENOSCOPY N/A 06/30/2016   Procedure: ESOPHAGOGASTRODUODENOSCOPY (EGD);  Surgeon: Jonathon Bellows, MD;  Location[facility] ENDOSCOPY;  Service: Endoscopy;  Laterality: N/A;   ESOPHAGOGASTRODUODENOSCOPY (EGD) WITH PROPOFOL N/A 12/01/2015   Procedure: ESOPHAGOGASTRODUODENOSCOPY (EGD) WITH PROPOFOL;  Surgeon: Lollie Sails, MD;  Location: [facility] ENDOSCOPY;  Service: Endoscopy;  Laterality: N/A;   TUBAL LIGATION     WISDOM TOOTH EXTRACTION      Prior to Admission medications   Medication Sig Start Date End Date Taking? Authorizing Provider  albuterol (PROVENTIL HFA;VENTOLIN HFA) 108 (90 Base) MCG/ACT inhaler Inhale 2 puffs into the lungs every 6 (six) hours as needed for wheezing or shortness of breath.    [provider]  ALPRAZolam Duanne Moron) 1 MG tablet Take 1 mg by mouth 3 (three) times  daily as needed.     [provider]  cholecalciferol (VITAMIN D) 1000 units tablet Take 2,000 Units by mouth daily.    [provider]  citalopram (CELEXA) 40 MG tablet Take 40 mg by mouth daily.  07/24/15   [provider]  diclofenac sodium (VOLTAREN) 1 % GEL Apply 4 g topically every 8 (eight) hours as needed. Patient taking differently: Apply 4 g topically 2 (two)  times daily.  12/06/16 08/20/18  Milinda Pointer, MD  dicyclomine (BENTYL) 10 MG capsule Take 10 mg by mouth 2 (two) times daily. 07/23/18   [provider]  fluticasone (FLONASE) 50 MCG/ACT nasal spray Place 1-2 sprays into both nostrils daily as needed for rhinitis.     [provider]  Folate-B12-Intrinsic Factor (INTRINSI B12-FOLATE) 710-626-94 MCG-MCG-MG TABS Take 1 tablet by mouth daily.     [provider]  gabapentin (NEURONTIN) 300 MG capsule Take 900 mg by mouth 3 (three) times daily.     [provider]  insulin regular human CONCENTRATED (HUMULIN R) 500 UNIT/ML injection Inject 100-200 Units into the skin 2 (two) times a day. Inject 200 units in the morning, inject 100 units nightly. Based on glucose readings    [provider]  lactulose (CHRONULAC) 10 GM/15ML solution Take 30 mLs (20 g total) by mouth 3 (three) times daily. Patient taking differently: Take 40 g by mouth 4 (four) times daily.  10/26/16   Nicholes Mango, MD  magnesium oxide (MAG-OX) 400 MG tablet Take 400 mg by mouth daily.    [provider]  nystatin (MYCOSTATIN/NYSTOP) powder Apply 1 g topically 3 (three) times daily as needed. For irritation 10/26/16   Gouru, Illene Silver, MD  Oxycodone HCl 10 MG TABS Take 10 mg by mouth 3 (three) times daily.     [provider]  pantoprazole (PROTONIX) 40 MG tablet Take 40 mg by mouth 2 (two) times daily before a meal.    [provider]  potassium chloride SA (K-DUR,KLOR-CON) 20 MEQ tablet Take 20 mEq by mouth daily as needed. For high blood pressure    [provider]  promethazine (PHENERGAN) 12.5 MG tablet Take 2 tablets (25 mg total) by mouth every 6 (six) hours as needed for nausea or vomiting. Patient taking differently: Take 12.5-25 mg by mouth every 6 (six) hours as needed for nausea or vomiting.  09/25/16   Dustin Flock, MD  rifaximin (XIFAXAN) 550 MG TABS tablet Take 550 mg by mouth daily.      [provider]  spironolactone (ALDACTONE) 100 MG tablet Take 400 mg by mouth daily.     [provider]  vitamin B-12 (CYANOCOBALAMIN) 1000 MCG tablet Take 2,000 mcg by mouth daily.    [provider]  SUMAtriptan (IMITREX) 50 MG tablet Take 50 mg by mouth every 2 (two) hours as needed. For migraines  03/17/11  [provider]    Allergies Nsaids, Tape, and Vicodin [hydrocodone-acetaminophen]  Family History  Problem Relation Age of Onset   Lung cancer Mother    Ulcers Father    Emphysema Father    Heart disease Sister    Ulcers Sister    Heart disease Brother     Social History Social History   Tobacco Use   Smoking status: Never Smoker   Smokeless tobacco: Never Used  Substance Use Topics   Alcohol use: No    Comment: occ   Drug use: No    Review of Systems Constitutional: No fever/chills Eyes:  No visual changes. ENT: No sore throat. Cardiovascular: Denies chest pain. Respiratory: Denies shortness of breath. Gastrointestinal: No abdominal pain.  No nausea, no vomiting.  No diarrhea.  No constipation. Genitourinary: Negative for dysuria. Musculoskeletal: Negative for neck pain.  Negative for back pain. Integumentary: Negative for rash. Neurological: Negative for headaches, focal weakness or numbness. Endocrine: Positive for hyperglycemia ____________________________________________   PHYSICAL EXAM:  VITAL SIGNS: ED Triage Vitals  Enc Vitals Group     BP 09/20/18 0026 109/64     Pulse Rate 09/20/18 0026 (!) 106     Resp 09/20/18 0026 16     Temp 09/20/18 0026 98.4 F (36.9 C)     Temp Source 09/20/18 0026 Oral     SpO2 09/20/18 0026 96 %     Weight 09/20/18 0029 99.8 kg (220 lb)     Height 09/20/18 0029 1.575 m (5' 2" )     Head Circumference --      Peak Flow --      Pain Score 09/20/18 0029 0     Pain Loc --      Pain Edu? --      Excl. in [place]? --     Constitutional: Alert and oriented.  Eyes:  Conjunctivae are normal.  Mouth/Throat: Mucous membranes are moist. Neck: No stridor.  No meningeal signs.   Cardiovascular: Normal rate, regular rhythm. Good peripheral circulation. Grossly normal heart sounds. Respiratory: Normal respiratory effort.  No retractions. Gastrointestinal: Soft and nontender. No distention.  Musculoskeletal: No lower extremity tenderness nor edema. No gross deformities of extremities. Neurologic:  Normal speech and language. No gross focal neurologic deficits are appreciated.  Skin:  Skin is warm, dry and intact. Psychiatric: Mood and affect are normal. Speech and behavior are normal.  ____________________________________________   LABS (all labs ordered are listed, but only abnormal results are displayed)  Labs Reviewed  CBC WITH DIFFERENTIAL/PLATELET - Abnormal; Notable for the following components:      Result Value   HCT 35.9 (*)    Platelets 94 (*)    Lymphs Abs 0.6 (*)    All other components within normal limits  COMPREHENSIVE METABOLIC PANEL - Abnormal; Notable for the following components:   Sodium 130 (*)    Chloride 93 (*)    Glucose, Bld 317 (*)    Albumin 3.4 (*)    Alkaline Phosphatase 173 (*)    Total Bilirubin 1.7 (*)    All other components within normal limits  URINALYSIS, COMPLETE (UACMP) WITH MICROSCOPIC - Abnormal; Notable for the following components:   Color, Urine YELLOW (*)    APPearance CLEAR (*)    Glucose, UA 150 (*)    Bacteria, UA RARE (*)    All other components within normal limits  GLUCOSE, CAPILLARY - Abnormal; Notable for the following components:   Glucose-Capillary 302 (*)    All other components within normal limits  GLUCOSE, CAPILLARY - Abnormal; Notable for the following components:   Glucose-Capillary 177 (*)    All other components within normal limits  CBG MONITORING, ED     Procedures   ____________________________________________   INITIAL IMPRESSION / MDM / [place] / ED  COURSE  As part of my medical decision making, I reviewed the following data within the electronic MEDICAL RECORD NUMBER   53 year old female presented with above-stated history and physical exam secondary to reported hyperglycemia at home.  Patient's glucose was 302 on arrival to the emergency department.  Glucose recheck at this time is  177.  As such no intervention for hyperglycemia was performed in the emergency department.  Patient advised to follow-up with primary care provider today for further outpatient evaluation and management  ____________________________________________  FINAL CLINICAL IMPRESSION(S) / ED DIAGNOSES  Final diagnoses:  Hyperglycemia     MEDICATIONS GIVEN DURING THIS VISIT:  Medications - No data to display   ED Discharge Orders    None      *Please note:  Cristel Rail was evaluated in Emergency Department on 09/20/2018 for the symptoms described in the history of present illness. She was evaluated in the context of the global COVID-19 pandemic, which necessitated consideration that the patient might be at risk for infection with the SARS-CoV-2 virus that causes COVID-19. Institutional protocols and algorithms that pertain to the evaluation of patients at risk for COVID-19 are in a state of rapid change based on information released by regulatory bodies including the CDC and federal and state organizations. These policies and algorithms were followed during the patient's care in the ED.  Some ED evaluations and interventions may be delayed as a result of limited staffing during the pandemic.*  Note:  This document was prepared using Dragon voice recognition software and may include unintentional dictation errors.   Gregor Hams, MD 09/20/18 [phone]

## 2018-09-20 NOTE — ED Triage Notes (Signed)
Patient ambulatory to triage with steady gait, without difficulty or distress noted, mask in place; pt reports FSBS [id number] at home; c/o "dry mouth"

## 2018-10-25 IMAGING — CR DG CHEST 1V PORT
1 series · 1 of 1 positions shown · non-contrast
Comparison: 04/29/2017 chest radiograph.

CLINICAL DATA: Weakness

EXAM:
PORTABLE CHEST 1 VIEW

[portable]
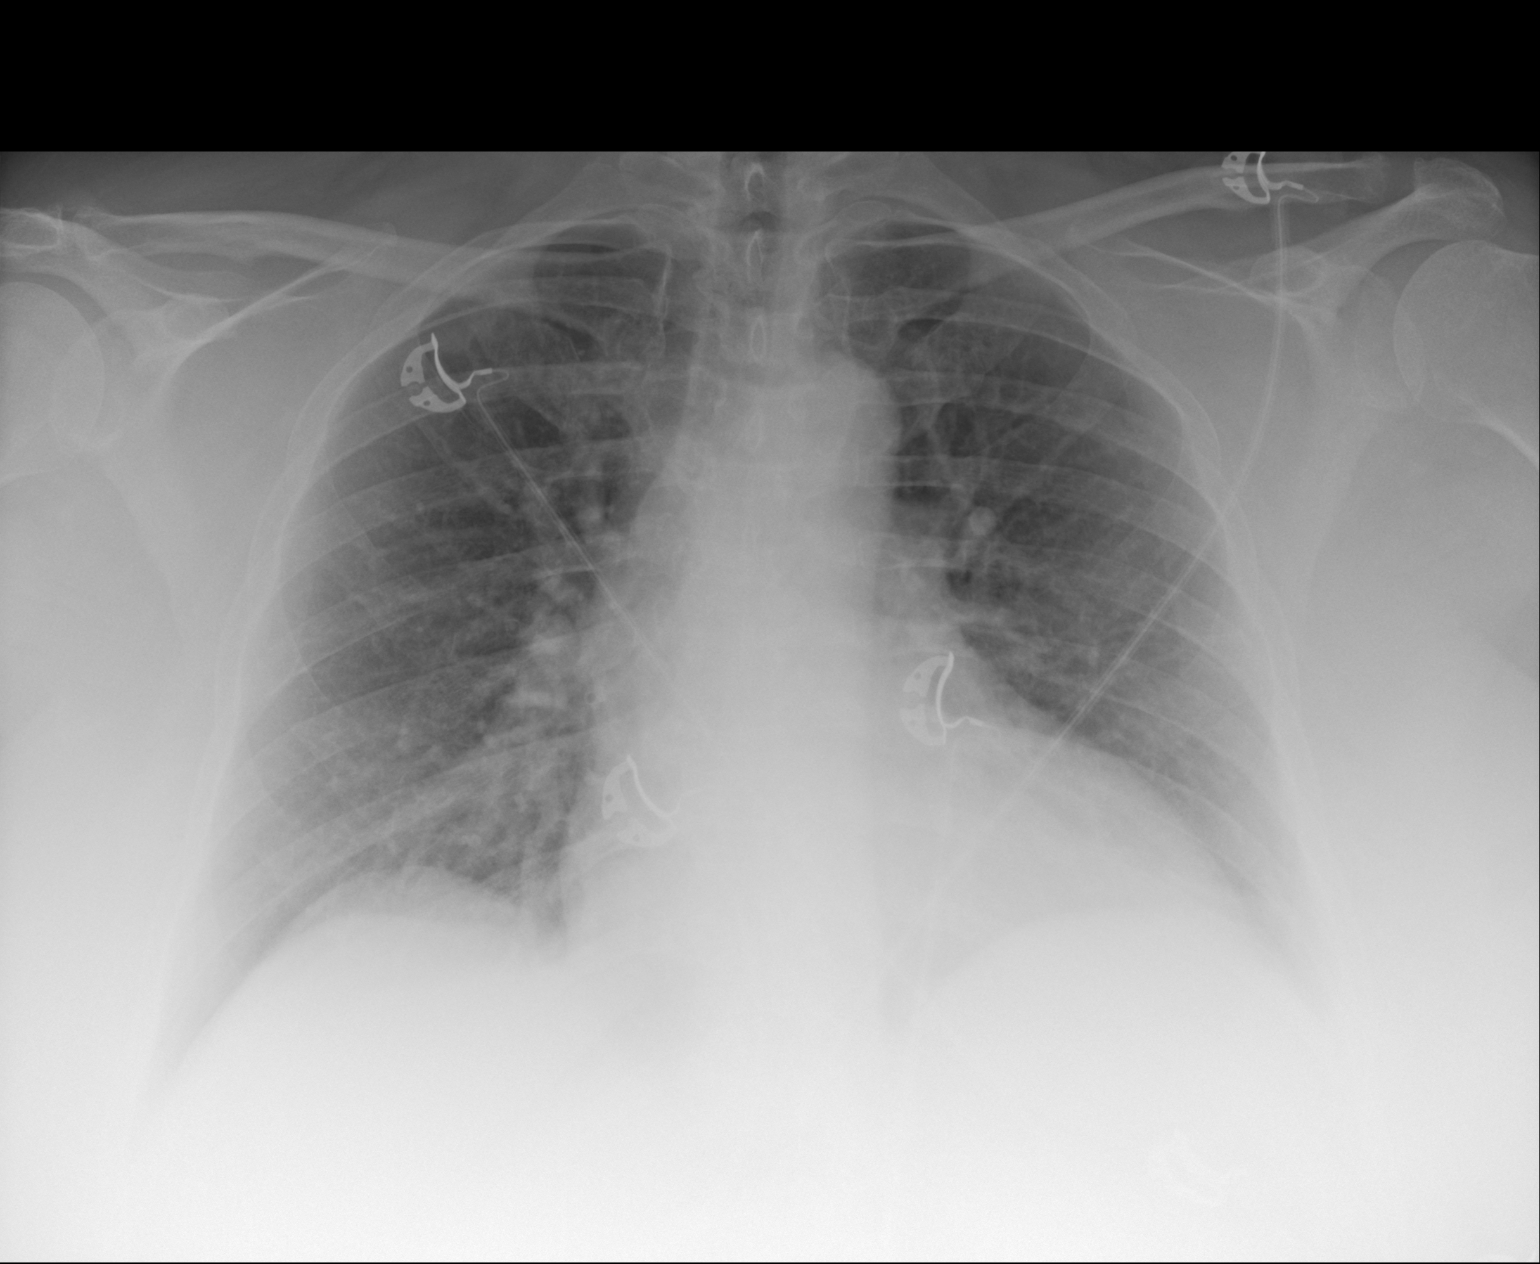

[1 of 1 positions shown; findings below may reference images not displayed]

FINDINGS: Stable cardiomediastinal silhouette with top-normal heart size. No
pneumothorax. No pleural effusion. Lungs appear clear, with no acute
consolidative airspace disease and no pulmonary edema.
IMPRESSION: No active disease.

## 2018-10-25 IMAGING — CT CT ABD-PELV W/ CM
2 of 5 series · 16 of 46 positions shown, 18 images · IV contrast (Isovue)
Comparison: 02/18/2017

CLINICAL DATA: Acute generalized abdominal pain, back pain, nausea
and vomiting for 2 days, RIGHT ear and jaw pain, history uterine
cancer, collagen vascular disease, COPD, type II diabetes mellitus,
hypertension, NASH

EXAM:
CT ABDOMEN AND PELVIS WITH CONTRAST
TECHNIQUE: Multidetector CT imaging of the abdomen and pelvis was performed
using the standard protocol following bolus administration of
intravenous contrast. Sagittal and coronal MPR images reconstructed
from axial data set.
CONTRAST:  100mL [id number] IOPAMIDOL ([id number]) INJECTION 61% IV.
Dilute oral contrast.

[Series 2: axial st · axial · 0.81mm/px · z∈[-528,-103]mm · 13 of 95 slices shown, 15 images]
[im 5/95  soft-tissue]
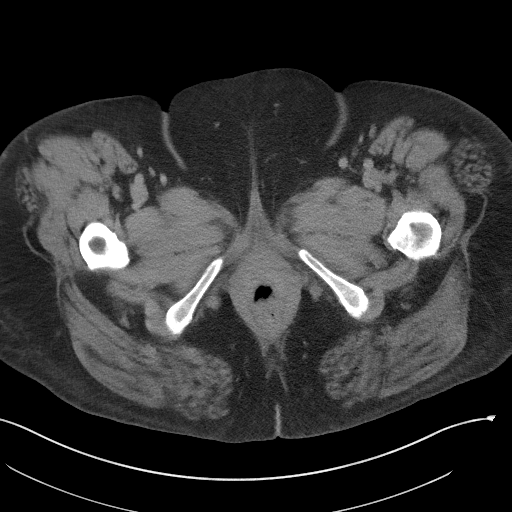
[im 5/95  bone]
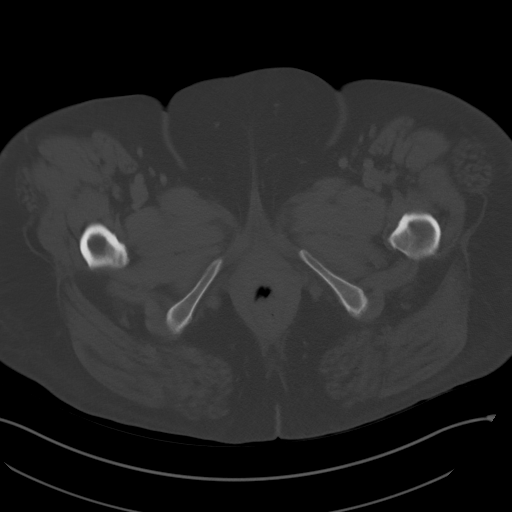
[im 15/95  soft-tissue]
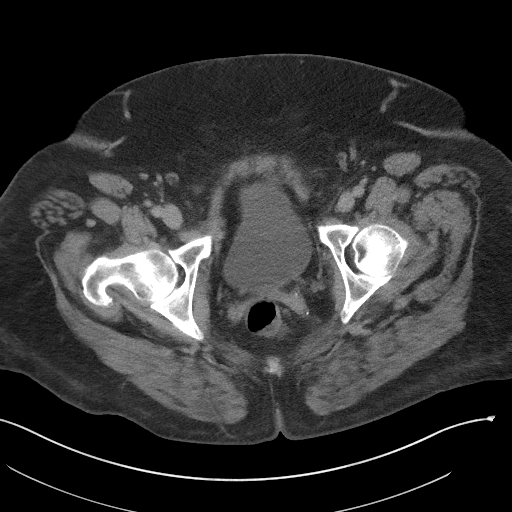
[im 19/95  soft-tissue]
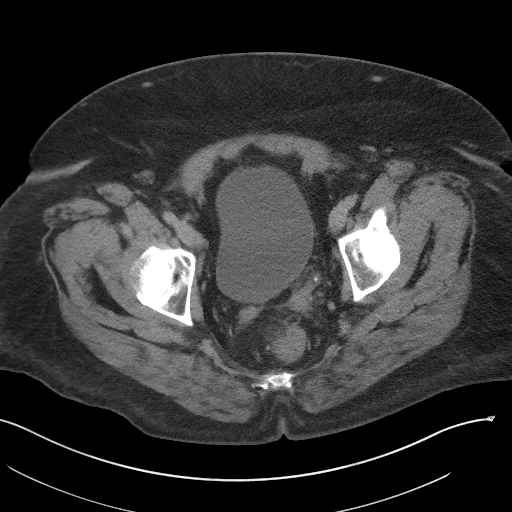
[im 29/95  soft-tissue]
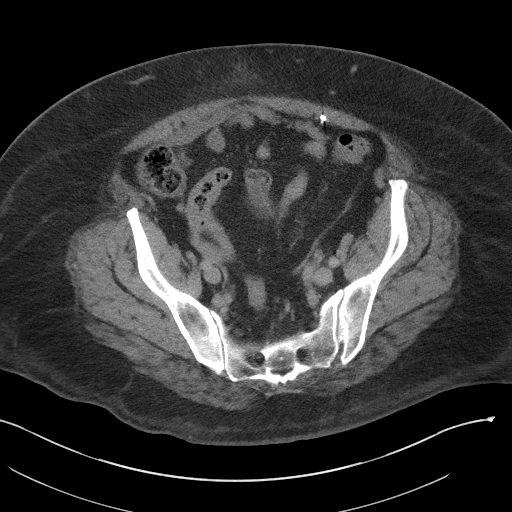
[im 33/95  soft-tissue]
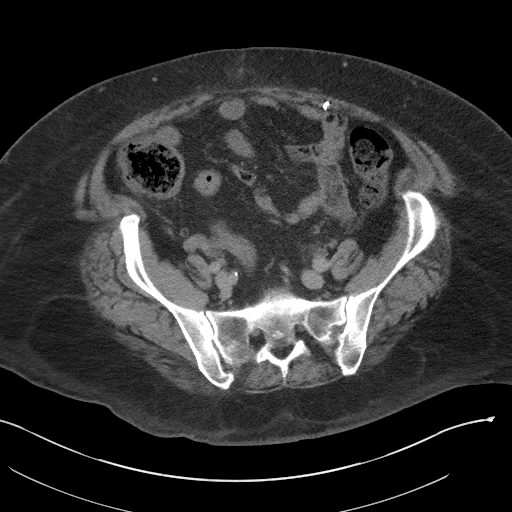
[im 43/95  soft-tissue]
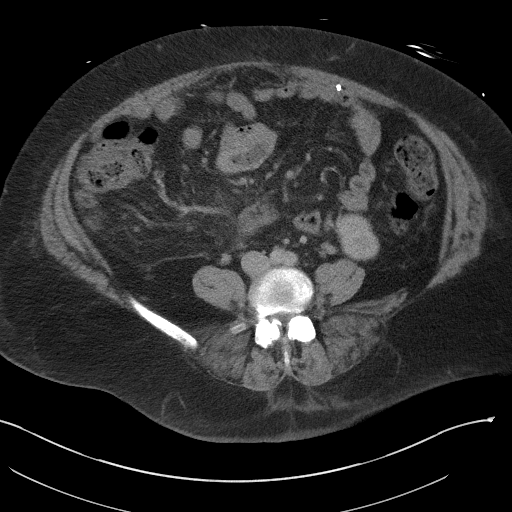
[im 48/95  soft-tissue]
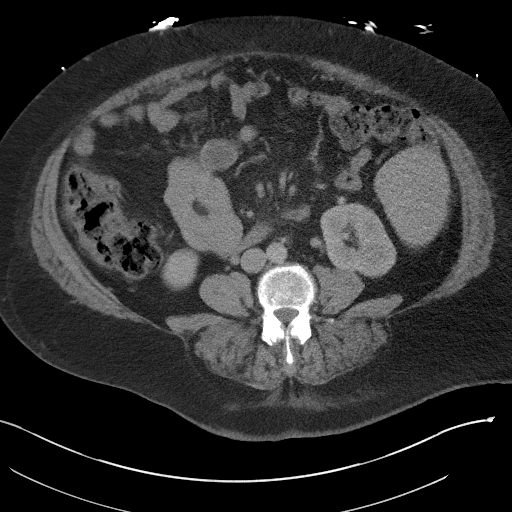
[im 52/95  soft-tissue]
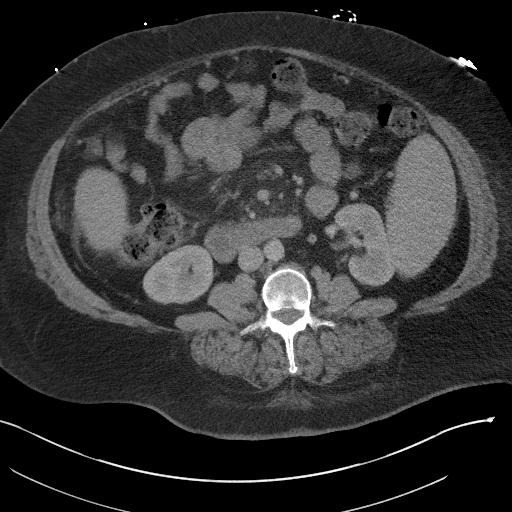
[im 62/95  soft-tissue]
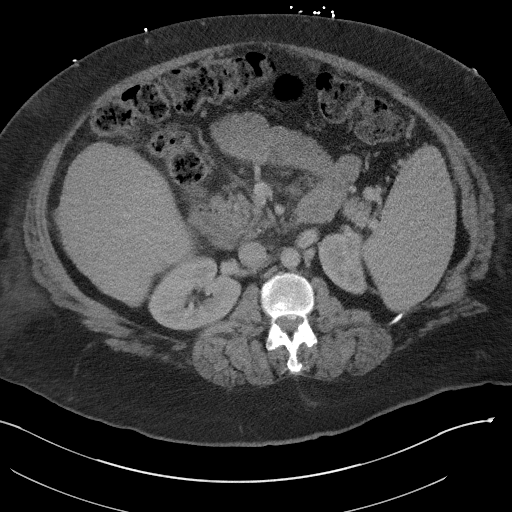
[im 62/95  bone]
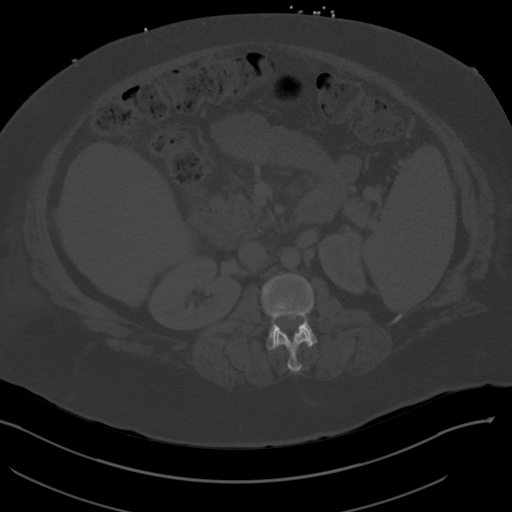
[im 66/95  soft-tissue]
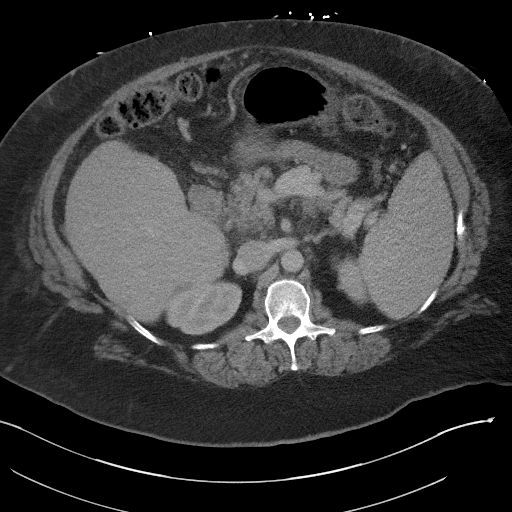
[im 76/95  soft-tissue]
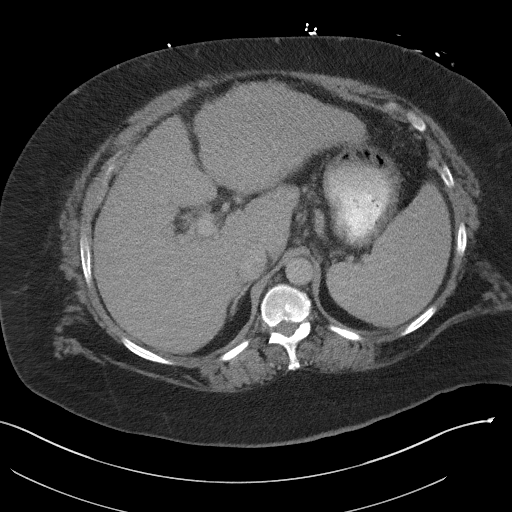
[im 80/95  soft-tissue]
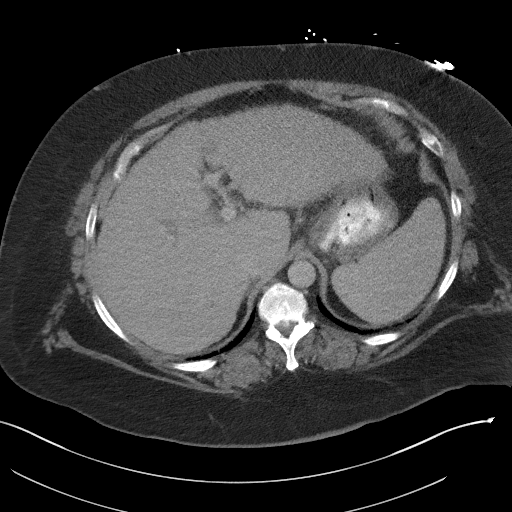
[im 90/95  soft-tissue]
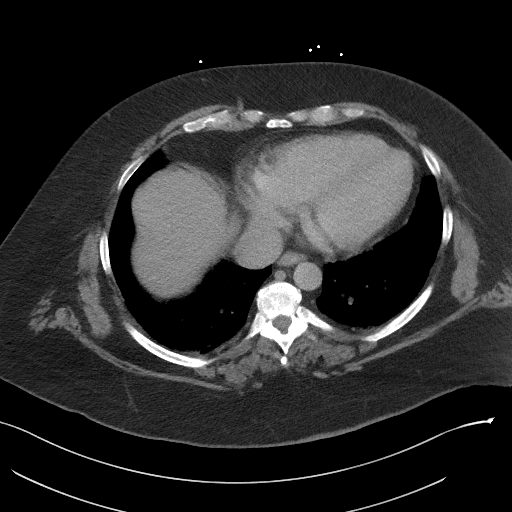

[Series 5: coronal st · coronal · 0.88mm/px · 3 of 111 slices shown]
[im 37/111  soft-tissue]
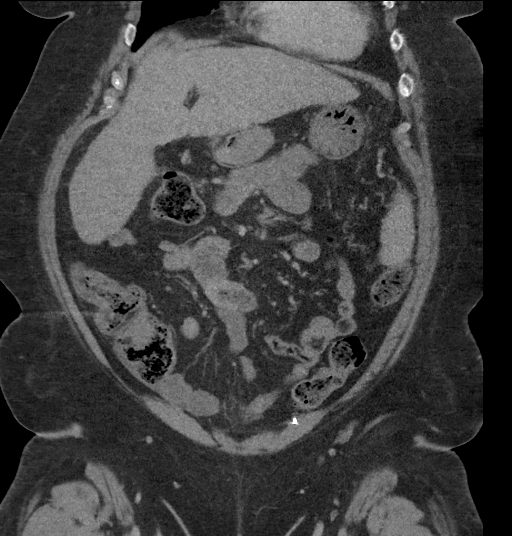
[im 49/111  soft-tissue]
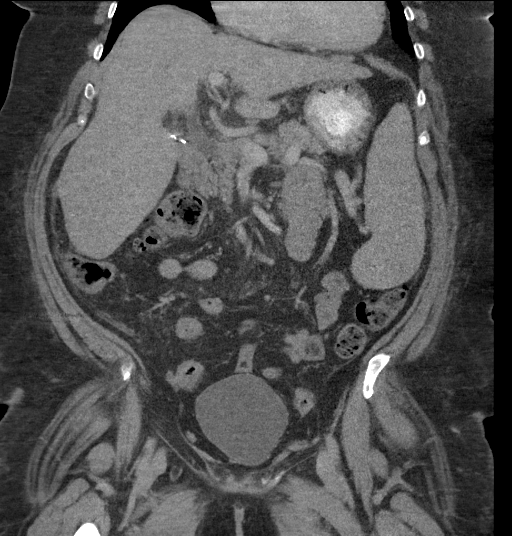
[im 62/111  soft-tissue]
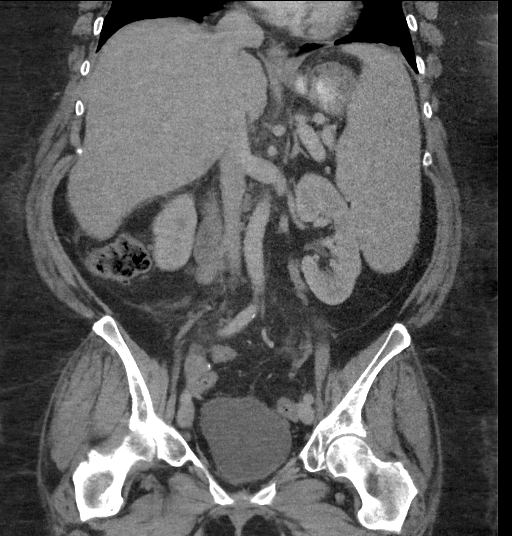

[16 of 46 positions shown; findings below may reference images not displayed]

FINDINGS: Lower chest: Dependent bibasilar atelectasis

Hepatobiliary: Gallbladder surgically absent. Hepatic margins appear
slightly nodular likely representing cirrhosis. Cyst lateral segment
RIGHT lobe liver adjacent to falciform fissure 2.2 x 1.7 cm
previously 1.9 x 1.7 cm. Additional tiny cysts within the liver
appear stable. No new focal hepatic mass lesion.

Pancreas: Normal appearance

Spleen: Splenomegaly, spleen measuring 13.7 x 6.8 x 20.5 cm (volume
= 4444 cm^3). No focal lesion.

Adrenals/Urinary Tract: Adrenal glands, kidneys, ureters, and
bladder normal appearance

Stomach/Bowel: Normal appendix. Stranding is seen in the fat
adjacent to the distal appendix also present on previous exam, with
appendix otherwise unremarkable. Stool throughout colon.
Questionable wall thickening of sigmoid colon versus artifact from
underdistention. Stomach unremarkable. Remaining bowel loops normal
appearance.

Vascular/Lymphatic: Scattered pelvic phleboliths. Minimal
atherosclerotic calcification aorta. Aorta normal caliber. No
adenopathy.

Reproductive: Uterus surgically absent. Probable normal sized
ovaries.

Other: No free intraperitoneal air or fluid. Diffuse stranding of
mesenteric and retroperitoneal tissue planes slightly increased from
previous exam, likely related to cirrhosis and portal hypertension.
No hernia.

Musculoskeletal: Bones demineralized.
IMPRESSION: Cirrhotic appearing liver with splenomegaly.

Hepatic cysts, largest 2.2 x 1.7 cm minimally increased.

Scattered stranding of retroperitoneal and mesenteric tissue planes,
question related to portal hypertension and cirrhosis.

Questionable wall thickening of the sigmoid colon versus artifact
from underdistention.

## 2018-10-30 IMAGING — CT CT ABD-PELV W/ CM
2 of 5 series · 16 of 46 positions shown, 18 images · IV contrast (Isovue)
Comparison: 06/11/2017

CLINICAL DATA: Abdominal and RIGHT flank pain, dysuria, question
appendicitis, history uterine cancer 28 years ago,, chin vascular
disease, COPD, GERD, asthma, type II diabetes mellitus essential
hypertension

EXAM:
CT ABDOMEN AND PELVIS WITH CONTRAST
TECHNIQUE: Multidetector CT imaging of the abdomen and pelvis was performed
using the standard protocol following bolus administration of
intravenous contrast. Sagittal and coronal MPR images reconstructed
from axial data set.
CONTRAST:  100mL 1NPIRW-LCC IOPAMIDOL (1NPIRW-LCC) INJECTION 61% IV.
No oral contrast.

[Series 2: axial st · axial · 0.84mm/px · z∈[-922,-496]mm · 13 of 97 slices shown, 15 images]
[im 6/97  soft-tissue]
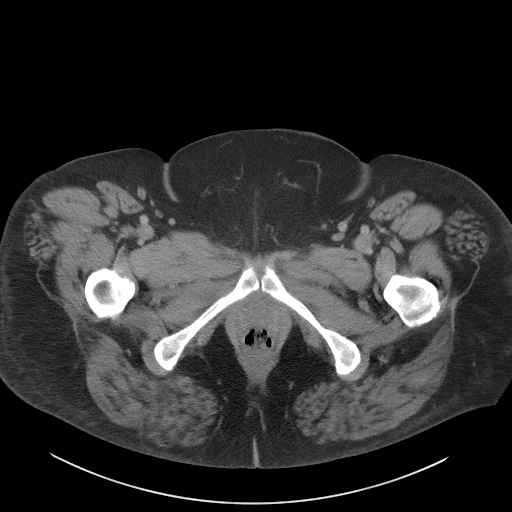
[im 6/97  bone]
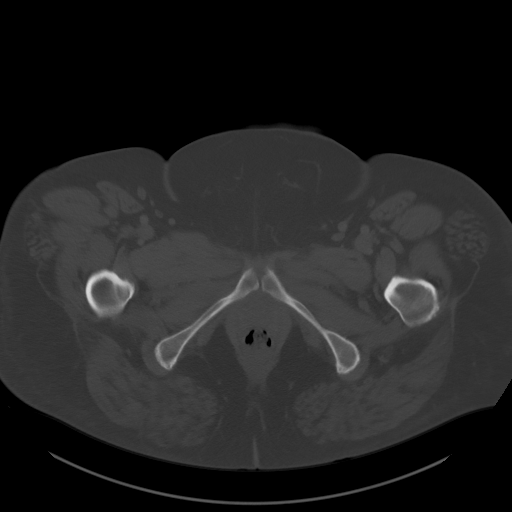
[im 16/97  soft-tissue]
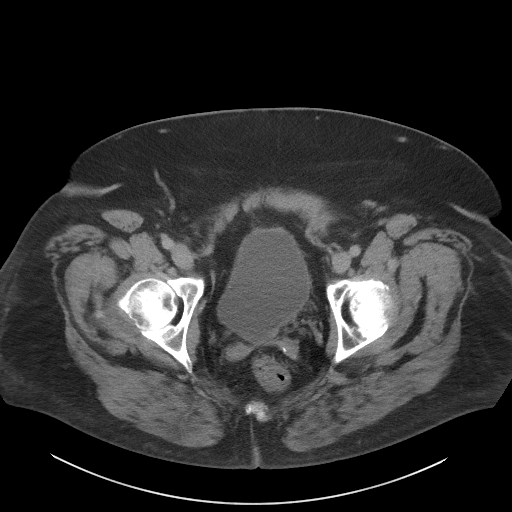
[im 21/97  soft-tissue]
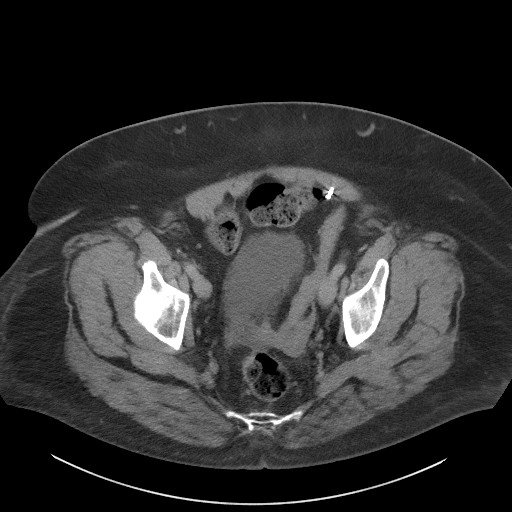
[im 26/97  soft-tissue]
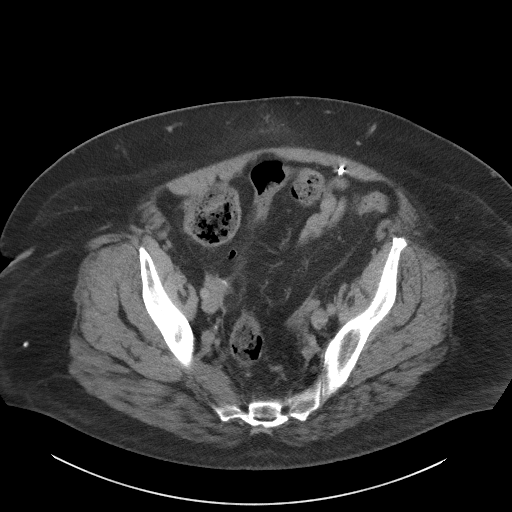
[im 36/97  soft-tissue]
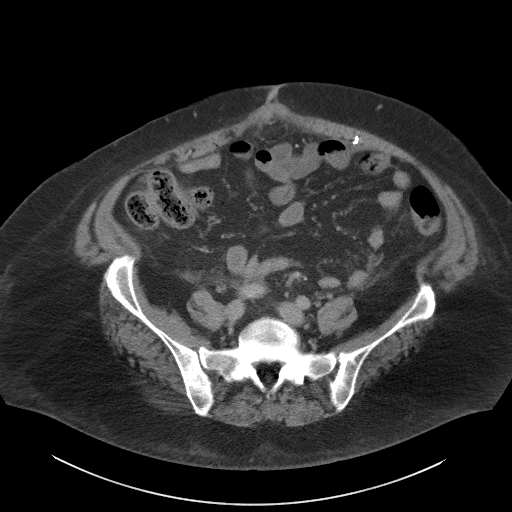
[im 41/97  soft-tissue]
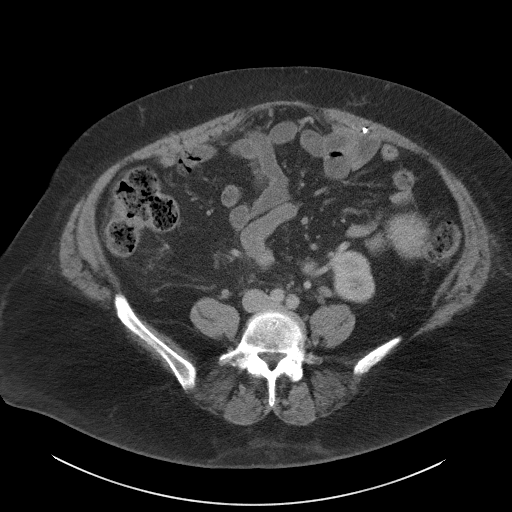
[im 51/97  soft-tissue]
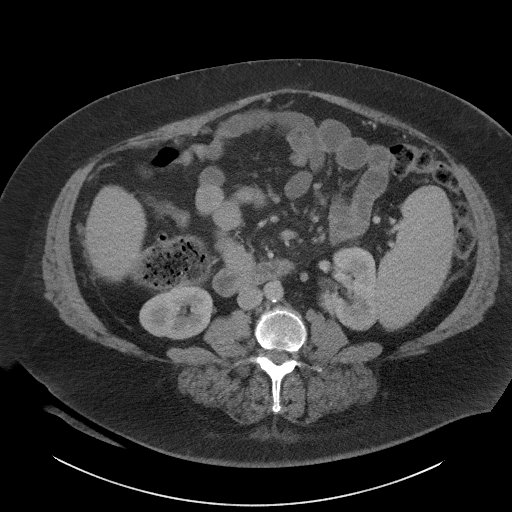
[im 56/97  soft-tissue]
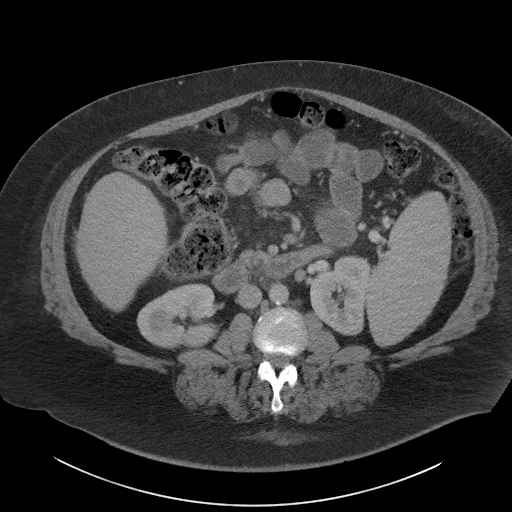
[im 61/97  soft-tissue]
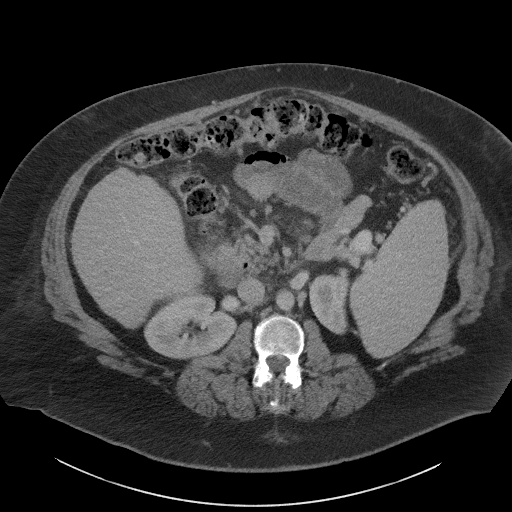
[im 61/97  bone]
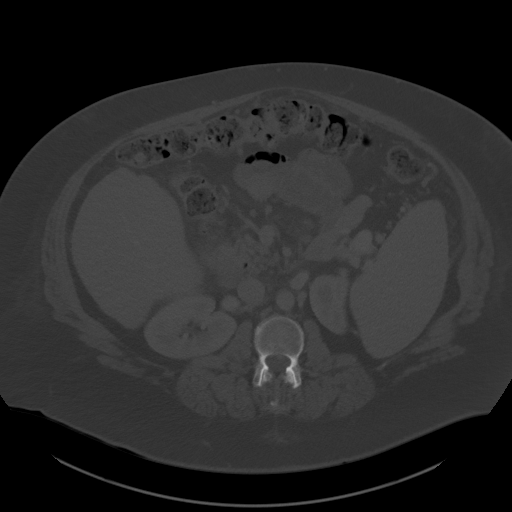
[im 71/97  soft-tissue]
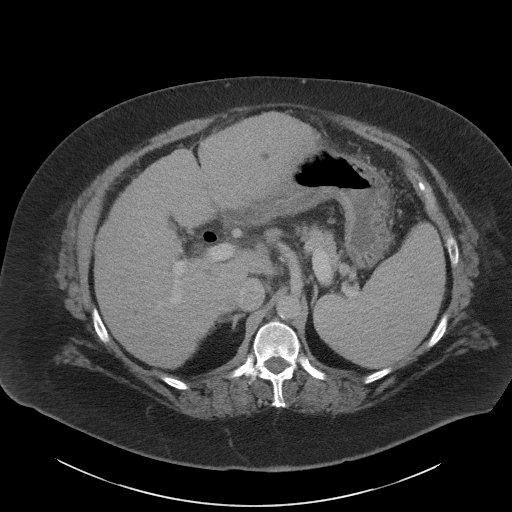
[im 76/97  soft-tissue]
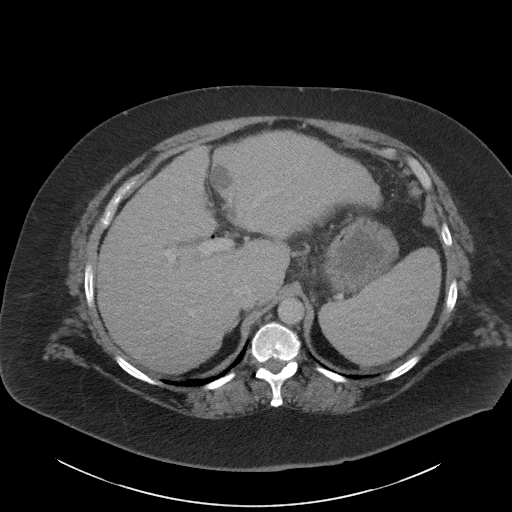
[im 81/97  soft-tissue]
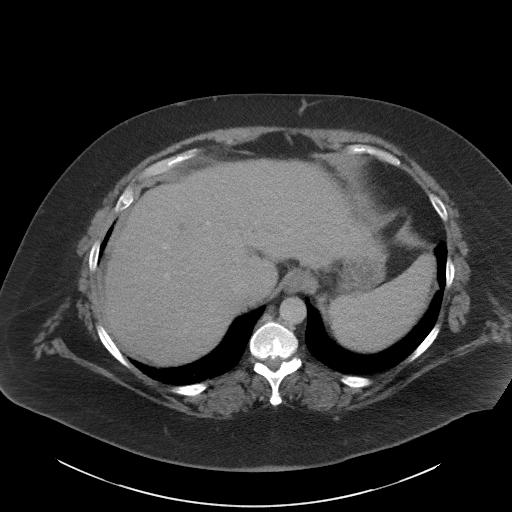
[im 91/97  soft-tissue]
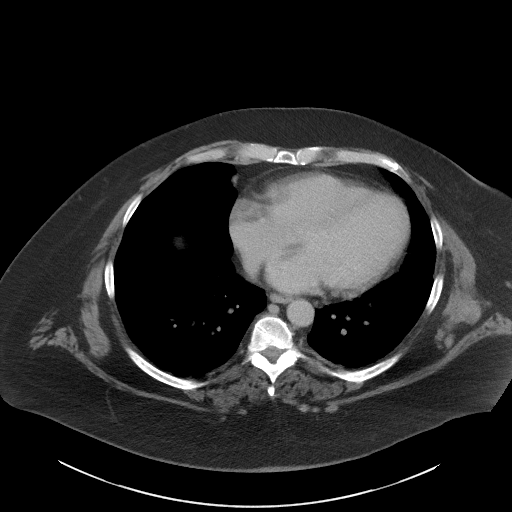

[Series 5: coronal st · coronal · 0.84mm/px · 3 of 111 slices shown]
[im 37/111  soft-tissue]
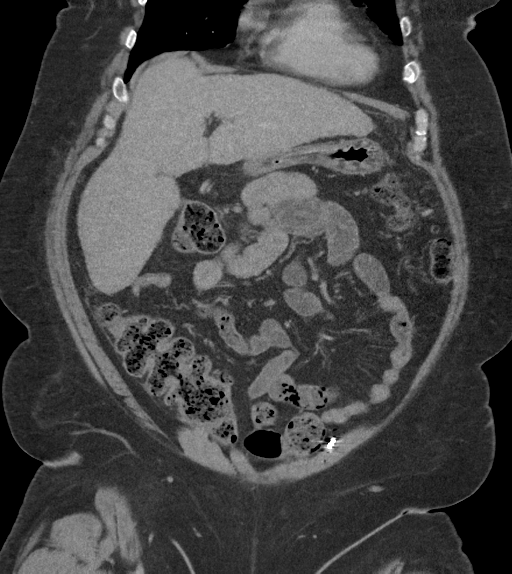
[im 49/111  soft-tissue]
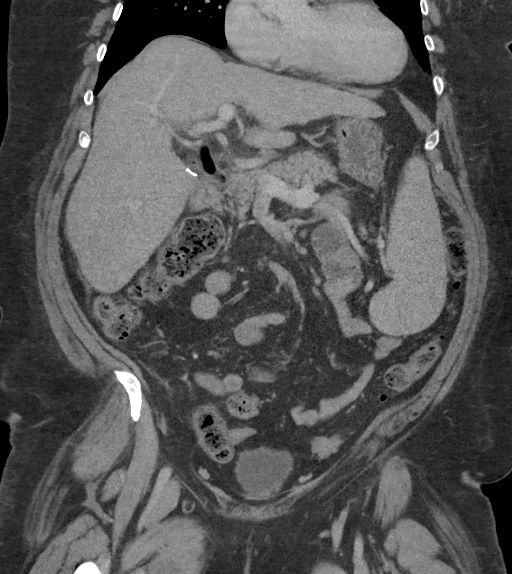
[im 62/111  soft-tissue]
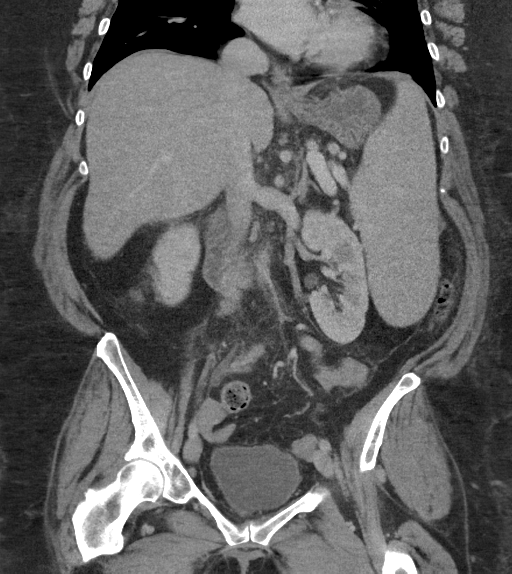

[16 of 46 positions shown; findings below may reference images not displayed]

FINDINGS: Lower chest: Minimal subsegmental atelectasis in the lower lobes

Hepatobiliary: Cirrhotic nodular appearing liver with stable
probable cyst in lateral segment LEFT lobe adjacent to falciform
fissure, 2.1 x 1.8 cm. Additional tiny cyst lateral segment LEFT
lobe image 27. Gallbladder surgically absent. Pneumobilia likely due
to prior ERCP with sphincterotomy.

Pancreas: Normal appearance

Spleen: Mild splenic enlargement with spleen measuring 13.8 x 6.8 x
20.6 cm (volume = 8333 cm^3). No focal splenic lesions.

Adrenals/Urinary Tract: Adrenal glands, kidneys, ureters, and
bladder normal appearance

Stomach/Bowel: Normal appendix. Stomach and bowel loops normal
appearance for technique

Vascular/Lymphatic: Aorta normal caliber. Minimal atherosclerotic
calcification aorta. No adenopathy.

Reproductive: Uterus surgically absent.  Unremarkable ovaries

Other: Minimal nonspecific free pelvic fluid. Minimal stranding in
the retroperitoneum and mesentery no free intraperitoneal air.
Appears unchanged.

Musculoskeletal: Osseous structures unremarkable.
IMPRESSION: Cirrhotic appearing liver with splenomegaly and stable LEFT lobe
hepatic cysts.

Pneumobilia likely representing prior ERCP.

No acute intra-abdominal or intrapelvic abnormalities.

No significant interval change.

## 2018-11-23 IMAGING — CR DG ABDOMEN 2V
1 series · 3 of 3 positions shown · non-contrast
Comparison: CT abdomen and pelvis June 16, 2017

CLINICAL DATA: Chest pain, shortness of breath and nausea.

EXAM:
ABDOMEN - 2 VIEW

[Series 1: dg abd acute w/chest · 0.14mm/px · 3 of 3 slices shown]
[im 1/3]
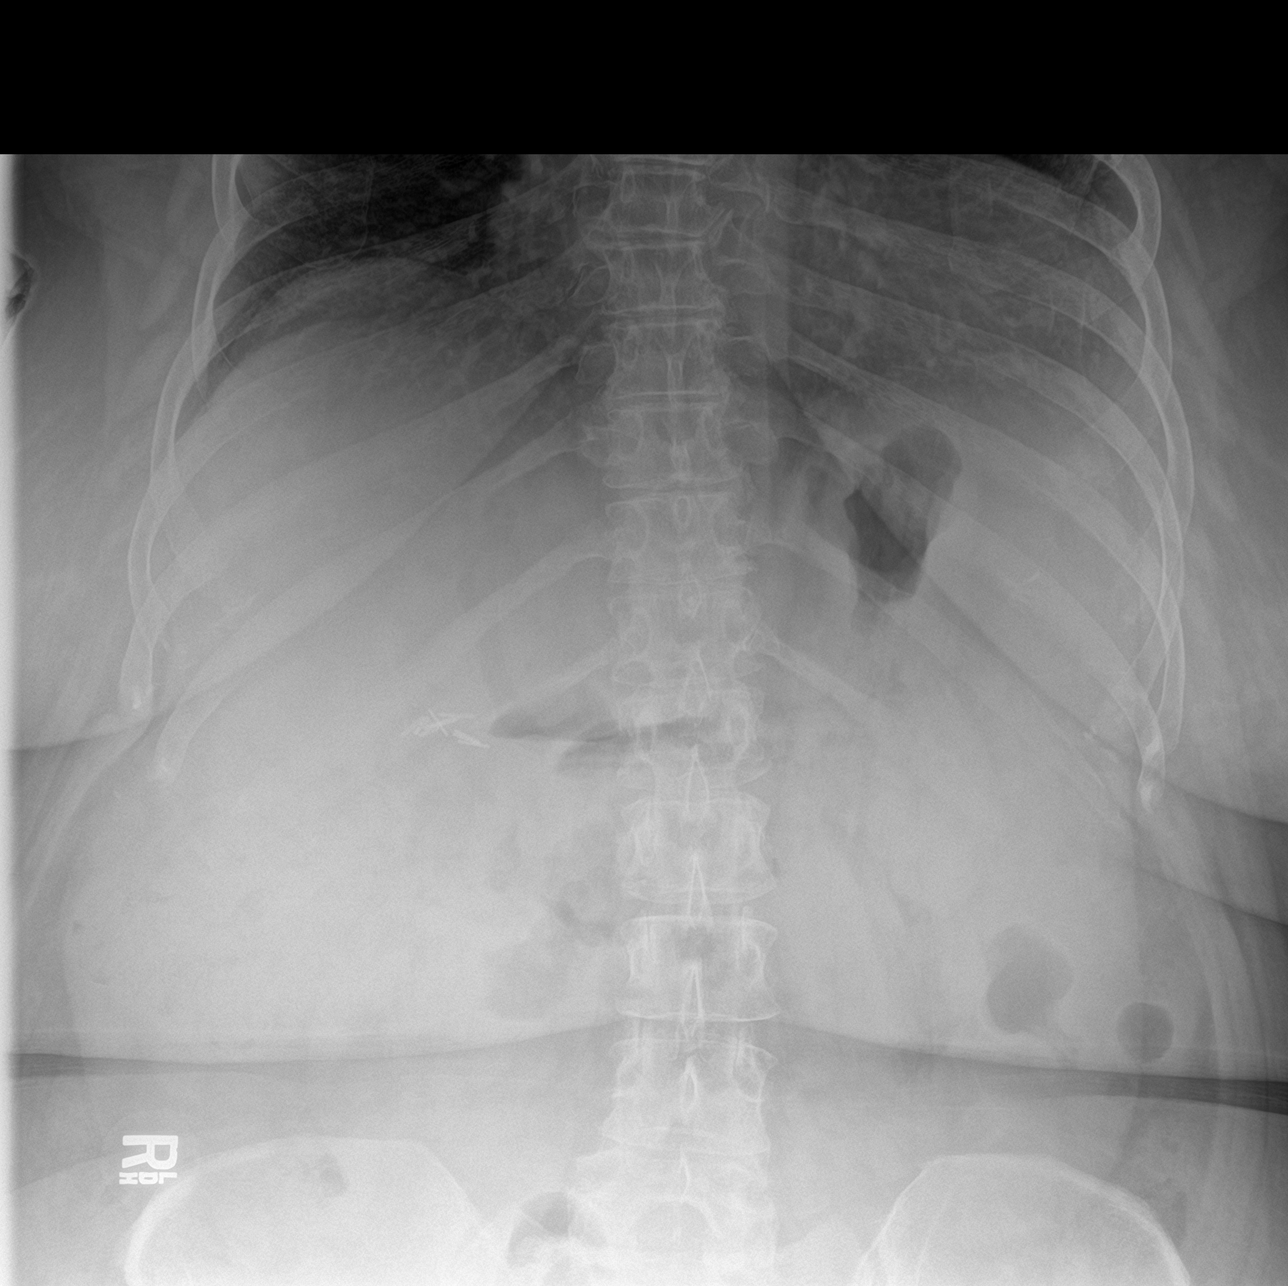
[im 2/3]
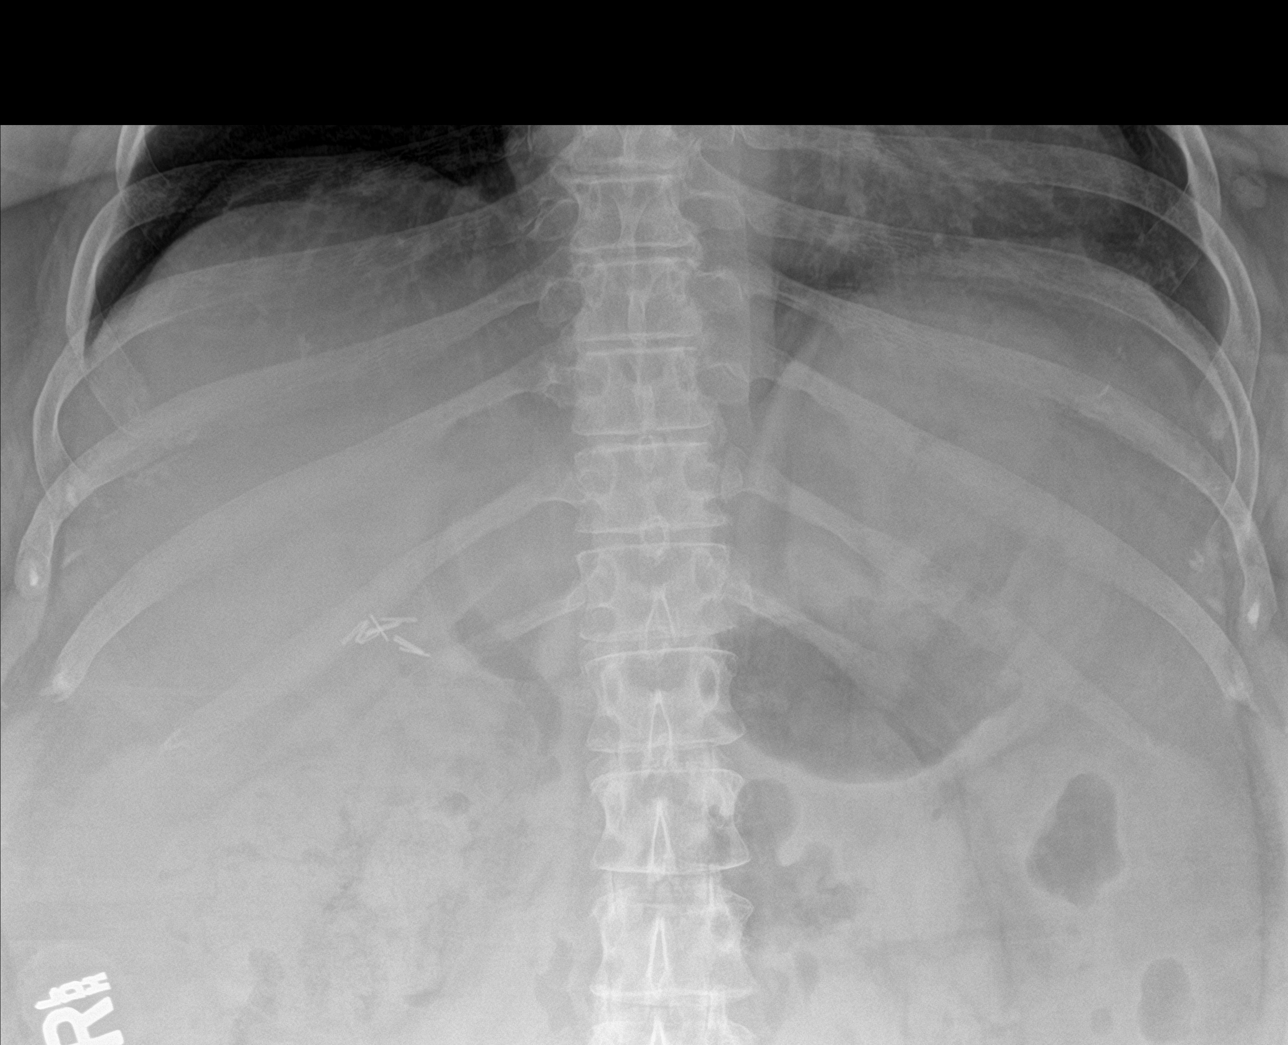
[im 3/3]
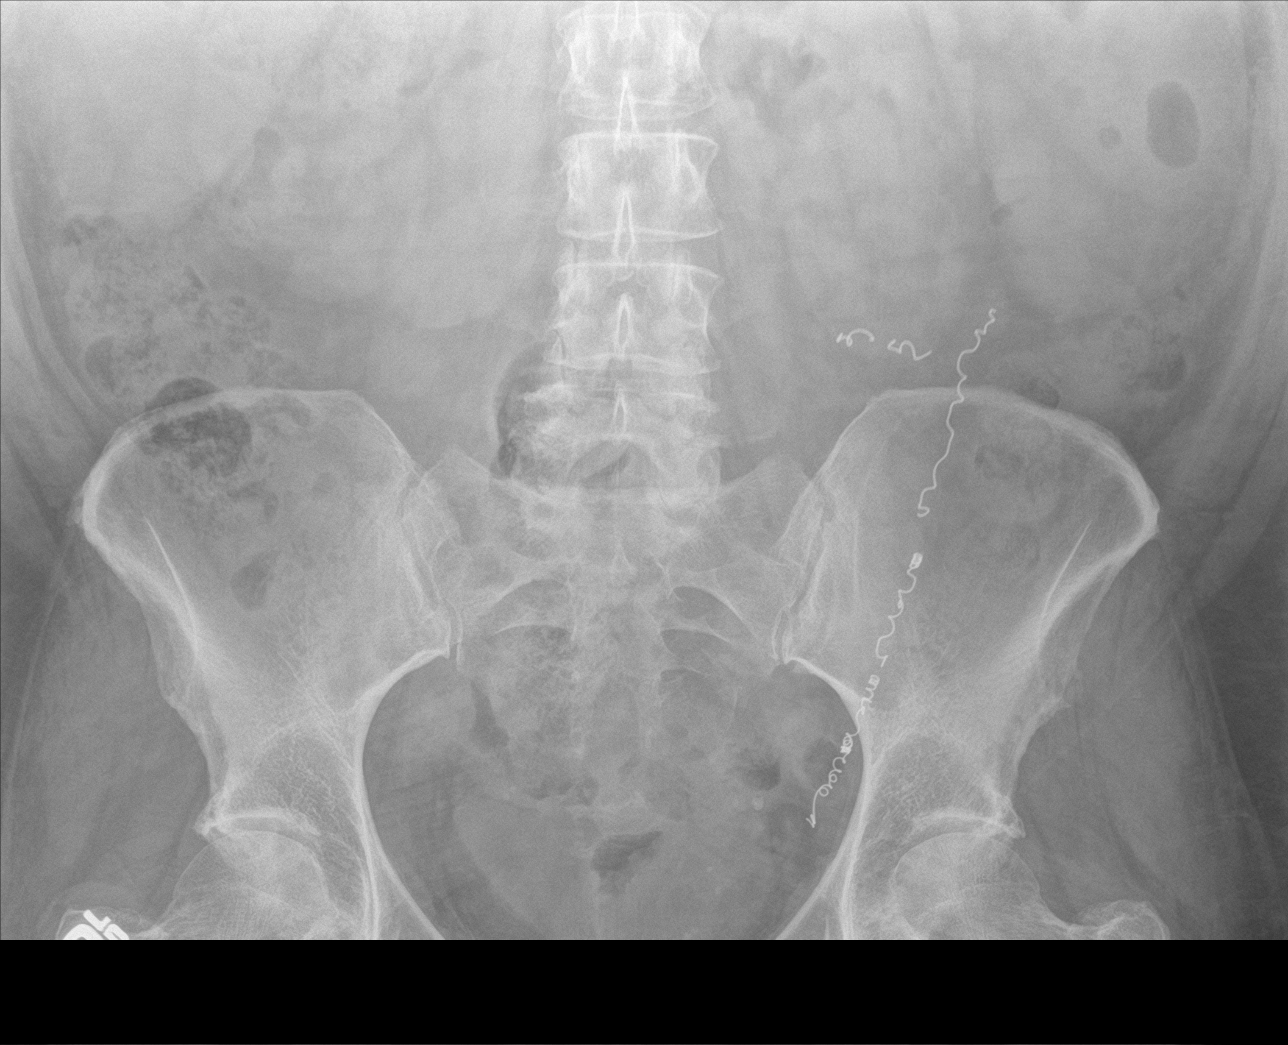

[3 of 3 positions shown; findings below may reference images not displayed]

FINDINGS: Bowel gas pattern is nondilated and nonobstructive. Surgical clips
in the included right abdomen compatible with cholecystectomy. Air
within CBD. No intraperitoneal free air or pathologic
calcifications. Enlarged splenic shadow. Coil embolization material
LEFT abdomen. Phleboliths project in the pelvis. Soft tissue planes
and included osseous structures are nonsuspicious.
IMPRESSION: 1. Normal bowel gas pattern.
2. Chronic splenomegaly.
3. Status post cholecystectomy with chronic pneumobilia.

## 2018-11-23 IMAGING — CT CT ABD-PELV W/ CM
2 of 5 series · 16 of 46 positions shown, 18 images · IV contrast (APPLIED)
Comparison: 07/10/2017 plain film examination. 06/16/2017,
06/11/2017, 02/18/2017 and 10/02/2016 CT.

CLINICAL DATA: 52-year-old female with nausea and vomiting. History
of uterine cancer post hysterectomy. Prior colon surgery and
cholecystectomy. Subsequent encounter.

EXAM:
CT ABDOMEN AND PELVIS WITH CONTRAST
TECHNIQUE: Multidetector CT imaging of the abdomen and pelvis was performed
using the standard protocol following bolus administration of
intravenous contrast.
CONTRAST:  100mL OMNIPAQUE IOHEXOL 300 MG/ML  SOLN

[Series 2: axial st · axial · 0.87mm/px · z∈[-565,-95]mm · 13 of 106 slices shown, 15 images]
[im 6/106  soft-tissue]
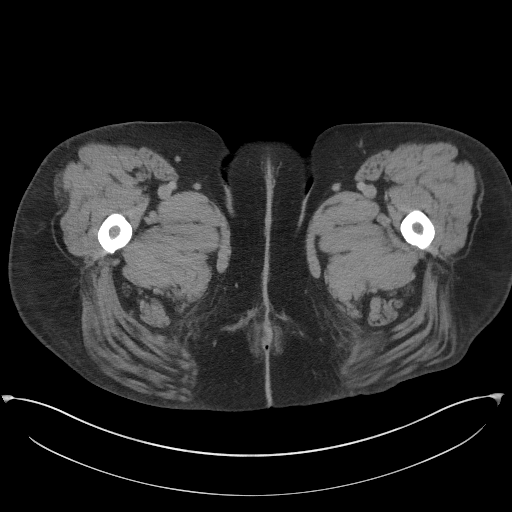
[im 6/106  bone]
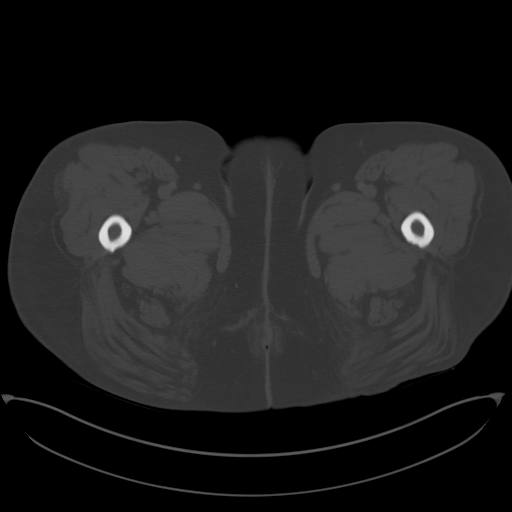
[im 12/106  soft-tissue]
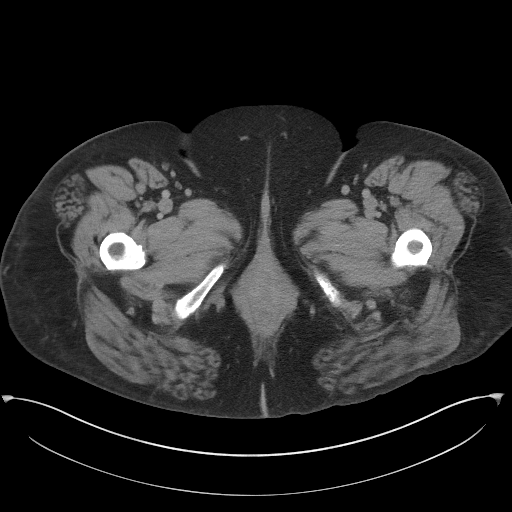
[im 24/106  soft-tissue]
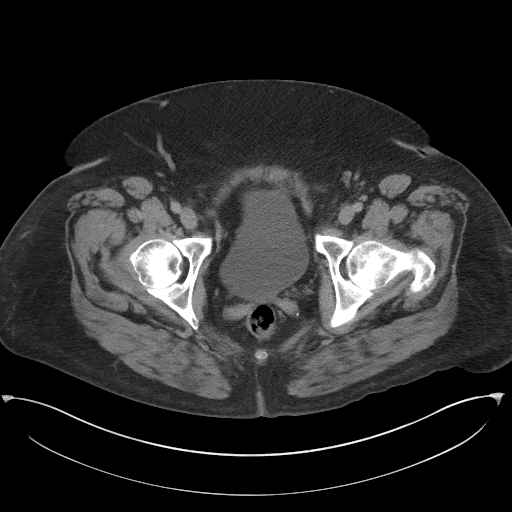
[im 30/106  soft-tissue]
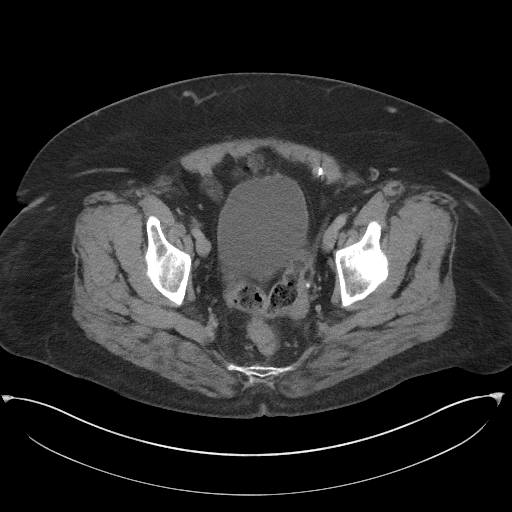
[im 36/106  soft-tissue]
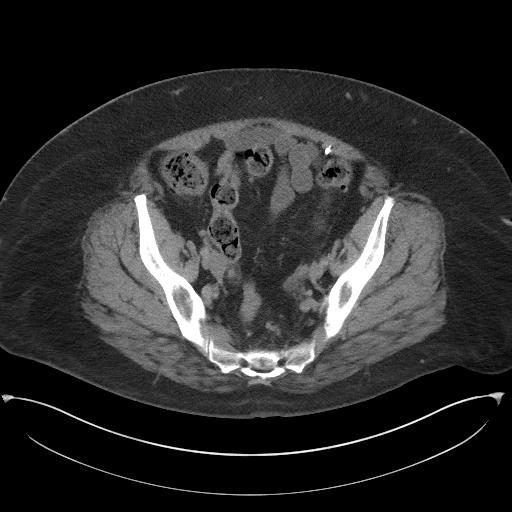
[im 47/106  soft-tissue]
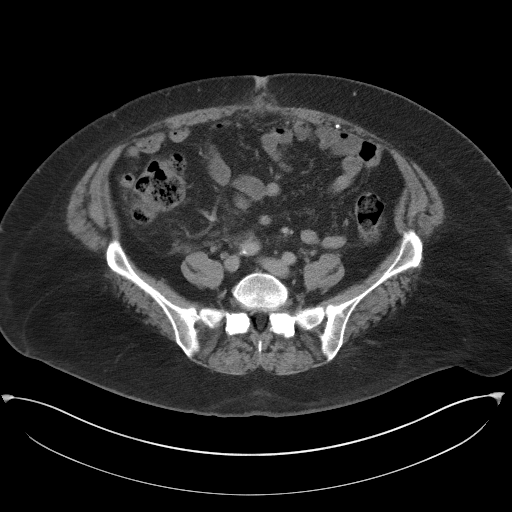
[im 53/106  soft-tissue]
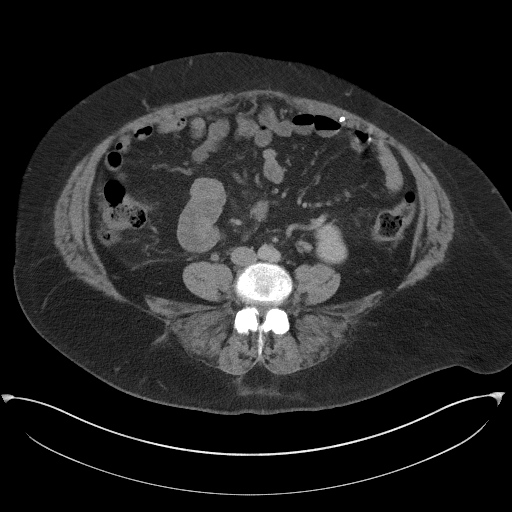
[im 59/106  soft-tissue]
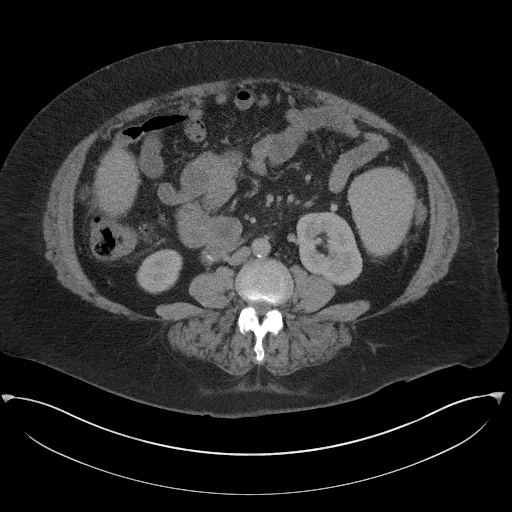
[im 71/106  soft-tissue]
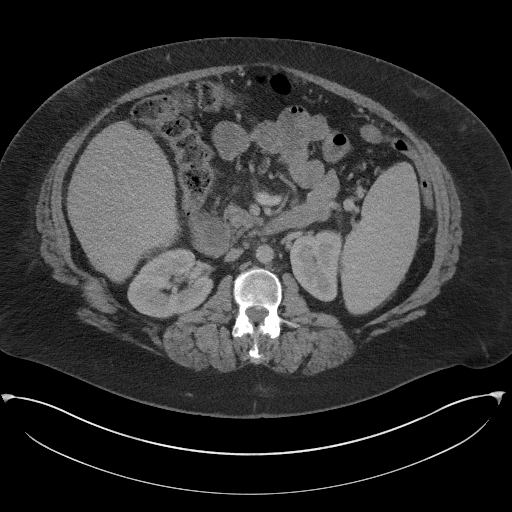
[im 71/106  bone]
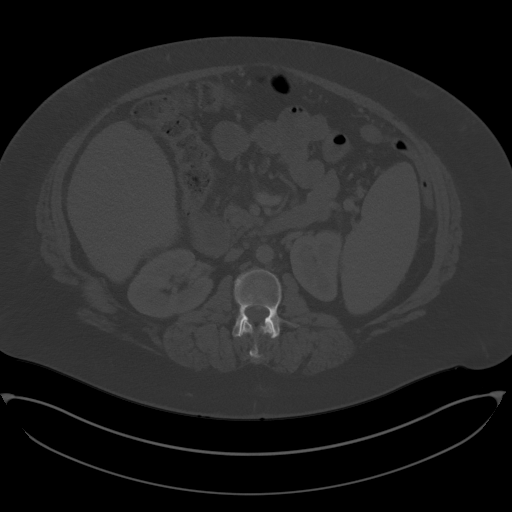
[im 76/106  soft-tissue]
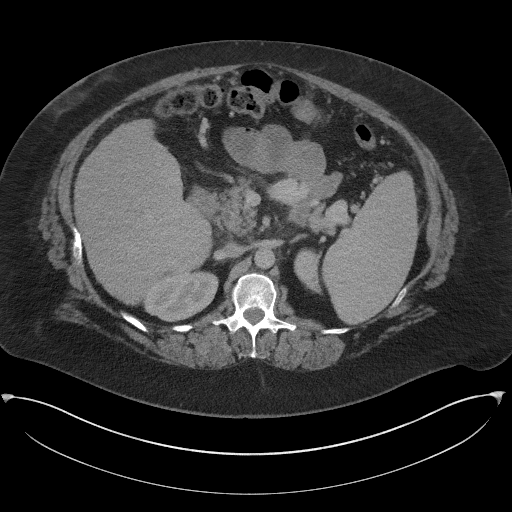
[im 82/106  soft-tissue]
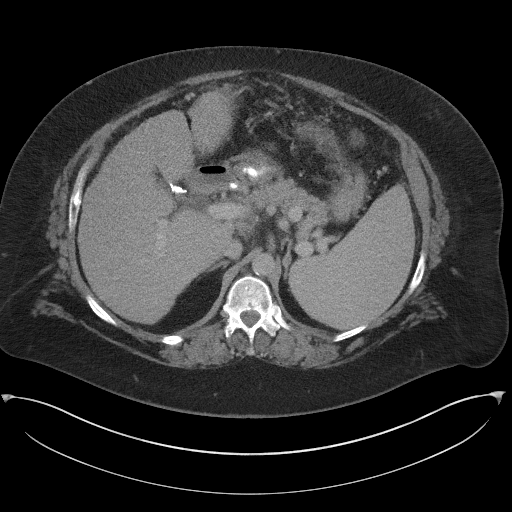
[im 94/106  soft-tissue]
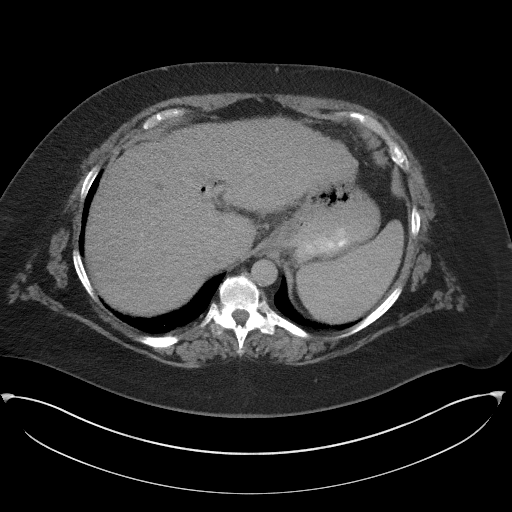
[im 100/106  soft-tissue]
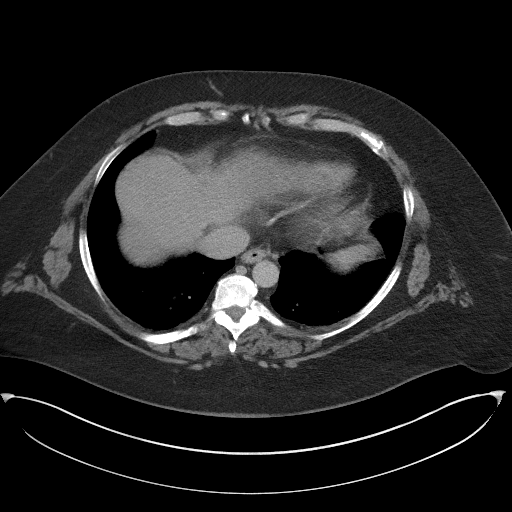

[Series 5: coronal st · coronal · 0.97mm/px · 3 of 107 slices shown]
[im 36/107  soft-tissue]
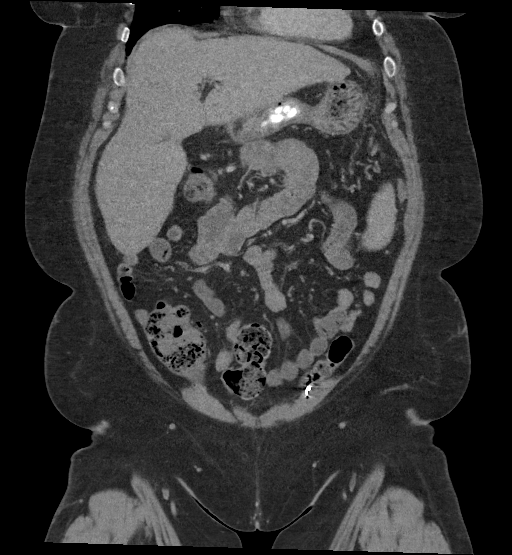
[im 48/107  soft-tissue]
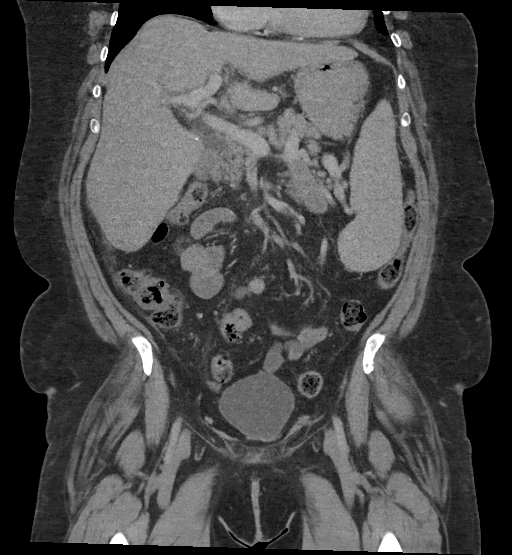
[im 59/107  soft-tissue]
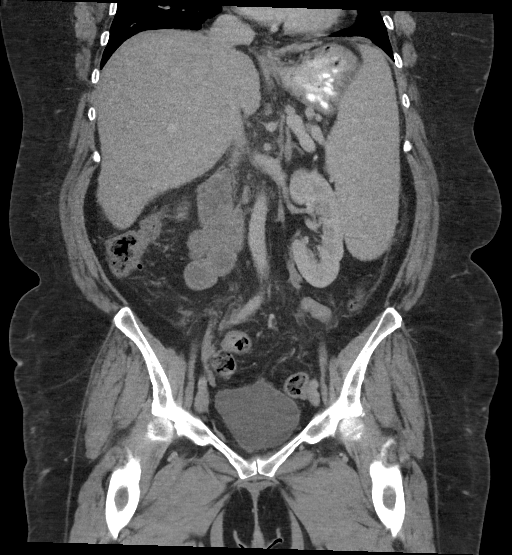

[16 of 46 positions shown; findings below may reference images not displayed]

FINDINGS: Lower chest: Minimal basilar scarring/subsegmental atelectasis.

Hepatobiliary: Enlarged liver spanning over 23.7 cm. Cirrhotic liver
with scattered low-density structures unchanged from the prior exam.

Post cholecystectomy. No calcified common bile duct stone. Prior
sphincterotomy with gas and biliary system.

Pancreas: No worrisome pancreatic mass or primary pancreatic
inflammation.

Spleen: Marked splenomegaly with spleen spanning over 20.5 cm. No
worrisome splenic lesion.

Adrenals/Urinary Tract: Slight flattening lateral aspect left kidney
by splenomegaly. No hydronephrosis. No worrisome renal or adrenal
mass. Noncontrast filled views of the urinary bladder unremarkable.

Stomach/Bowel: Taking into account under distended aspects of bowel
and mild third spacing of fluid, no primary bowel inflammatory
process is noted.

Vascular/Lymphatic: Atherosclerotic changes aorta without aneurysm
or large vessel occlusion. Slight haziness along the anterior margin
right common iliac artery unchanged.

Portal vein is patent. Tortuous patent splenic vein. Prominent veins
throughout the omentum.

Reactive lymph nodes upper abdomen unchanged.

Reproductive: Post hysterectomy.  No worrisome adnexal mass.

Other: No free air or bowel containing hernia. Metallic structure
along left rectus muscle unchanged.

Musculoskeletal: Degenerative changes thoracic and lumbar spine.
Scattered small calcified discs.
IMPRESSION: Cirrhotic liver with stable low-density structures. Post
sphincterotomy with pneumobilia.

Marked splenomegaly.

Tortuous prominent splenic vein. No portal vein occlusion. Prominent
veins within the omentum.

Minimal third spacing of fluid.

No primary bowel inflammatory process noted.

Aortic Atherosclerosis ([id number]).

## 2019-04-12 ENCOUNTER — Emergency Department
Admission: EM | Admit: 2019-04-12 | Discharge: 2019-04-12 | Disposition: A | Discharge status: Home / Self Care | Payer: Medicaid Other | Attending: Emergency Medicine | Admitting: Emergency Medicine

## 2019-04-12 ENCOUNTER — Encounter: Payer: Self-pay | Admitting: *Deleted

## 2019-04-12 ENCOUNTER — Other Ambulatory Visit: Payer: Self-pay

## 2019-04-12 DIAGNOSIS — N189 Chronic kidney disease, unspecified: Secondary | ICD-10-CM | POA: Insufficient documentation

## 2019-04-12 DIAGNOSIS — Z79899 Other long term (current) drug therapy: Secondary | ICD-10-CM | POA: Diagnosis not present

## 2019-04-12 DIAGNOSIS — R140 Abdominal distension (gaseous): Secondary | ICD-10-CM | POA: Diagnosis not present

## 2019-04-12 DIAGNOSIS — R609 Edema, unspecified: Secondary | ICD-10-CM | POA: Diagnosis not present

## 2019-04-12 DIAGNOSIS — K7460 Unspecified cirrhosis of liver: Secondary | ICD-10-CM | POA: Insufficient documentation

## 2019-04-12 DIAGNOSIS — Z794 Long term (current) use of insulin: Secondary | ICD-10-CM | POA: Diagnosis not present

## 2019-04-12 DIAGNOSIS — R1084 Generalized abdominal pain: Secondary | ICD-10-CM | POA: Diagnosis present

## 2019-04-12 DIAGNOSIS — E1122 Type 2 diabetes mellitus with diabetic chronic kidney disease: Secondary | ICD-10-CM | POA: Diagnosis not present

## 2019-04-12 DIAGNOSIS — I129 Hypertensive chronic kidney disease with stage 1 through stage 4 chronic kidney disease, or unspecified chronic kidney disease: Secondary | ICD-10-CM | POA: Insufficient documentation

## 2019-04-12 DIAGNOSIS — R109 Unspecified abdominal pain: Secondary | ICD-10-CM

## 2019-04-12 LAB — CBC WITH DIFFERENTIAL/PLATELET
Abs Immature Granulocytes: 0.01 10*3/uL (ref 0.00–0.07)
Basophils Absolute: 0 10*3/uL (ref 0.0–0.1)
Basophils Relative: 1 %
Eosinophils Absolute: 0.1 10*3/uL (ref 0.0–0.5)
Eosinophils Relative: 3 %
HCT: 31.1 % — ABNORMAL LOW (ref 36.0–46.0)
Hemoglobin: 11.3 g/dL — ABNORMAL LOW (ref 12.0–15.0)
Immature Granulocytes: 0 %
Lymphocytes Relative: 9 %
Lymphs Abs: 0.3 10*3/uL — ABNORMAL LOW (ref 0.7–4.0)
MCH: 34.3 pg — ABNORMAL HIGH (ref 26.0–34.0)
MCHC: 36.3 g/dL — ABNORMAL HIGH (ref 30.0–36.0)
MCV: 94.5 fL (ref 80.0–100.0)
Monocytes Absolute: 0.4 10*3/uL (ref 0.1–1.0)
Monocytes Relative: 13 %
Neutro Abs: 2.1 10*3/uL (ref 1.7–7.7)
Neutrophils Relative %: 74 %
Platelets: 75 10*3/uL — ABNORMAL LOW (ref 150–400)
RBC: 3.29 MIL/uL — ABNORMAL LOW (ref 3.87–5.11)
RDW: 14.9 % (ref 11.5–15.5)
WBC: 2.8 10*3/uL — ABNORMAL LOW (ref 4.0–10.5)
nRBC: 0 % (ref 0.0–0.2)

## 2019-04-12 LAB — COMPREHENSIVE METABOLIC PANEL
ALT: 19 U/L (ref 0–44)
AST: 33 U/L (ref 15–41)
Albumin: 2.4 g/dL — ABNORMAL LOW (ref 3.5–5.0)
Alkaline Phosphatase: 75 U/L (ref 38–126)
Anion gap: 7 (ref 5–15)
BUN: 11 mg/dL (ref 6–20)
CO2: 26 mmol/L (ref 22–32)
Calcium: 8.1 mg/dL — ABNORMAL LOW (ref 8.9–10.3)
Chloride: 97 mmol/L — ABNORMAL LOW (ref 98–111)
Creatinine, Ser: 0.58 mg/dL (ref 0.44–1.00)
GFR calc Af Amer: >60 mL/min (ref >60)
GFR calc non Af Amer: >60 mL/min (ref >60)
Glucose, Bld: 221 mg/dL — ABNORMAL HIGH (ref 70–99)
Potassium: 3.8 mmol/L (ref 3.5–5.1)
Sodium: 130 mmol/L — ABNORMAL LOW (ref 135–145)
Total Bilirubin: 2.7 mg/dL — ABNORMAL HIGH (ref 0.3–1.2)
Total Protein: 5.3 g/dL — ABNORMAL LOW (ref 6.5–8.1)

## 2019-04-12 MED ORDER — FUROSEMIDE 10 MG/ML IJ SOLN
60.0000 mg | Freq: Once | INTRAMUSCULAR | Status: AC
  Administered 2019-04-12: 18:00:00 60 mg via INTRAVENOUS
  Filled 2019-04-12: qty 8

## 2019-04-12 NOTE — ED Provider Notes (Signed)
[facility] Emergency Department Provider Note  ____________________________________________   I have reviewed the triage vital signs and the nursing notes.   HISTORY  Chief Complaint Abdominal Pain   History limited by: Not Limited   HPI Lori Mckenzie is a 54 y.o. female who presents to the emergency department today because of concerns for abdominal pain and abdominal distention.  The patient has a history of liver disease and cirrhosis.  The patient states that she has gained weight over the past 2 days.  She was seen at [facility] emergency department and discharged earlier today for the same complaints as well as urinary retention. She did have foley catheter placed.  She says that she then contacted her primary care doctor who recommended she go to [facility] where she is been seen before.  She states she was not able to get transferred to [facility].  Does not sound like anything has changed since her recent discharge from [facility] ED. Record of that visit reviewed. Patient with reassuring work up, including negative procalcitonin.  Records reviewed. Per medical record review patient has a history of cirrhosis, ED visit to [facility] earlier today.  Past Medical History:  Diagnosis Date  . Abdominal abscess 08/25/2014  . Acid reflux 08/10/2010  . Acute cervical myofascial strain 02/09/2016  . Acute postoperative pain 08/09/2016  . Anxiety   . Ascites   . Asthma   . Back pain   . Bile leak, postoperative 07/17/2014  . Brittle bone disease   . Cancer ([place])    Uteriine  ca 51yr ago partial hysterectomy  . Cervical disc disease   . Chronic kidney disease   . Collagen vascular disease ([place]    RA  3-4 yrs ago  . COPD (chronic obstructive pulmonary disease) ([place]   . Diabetes mellitus without complication (HE[place]   . GERD (gastroesophageal reflux disease)   . Hypertension   . Hypothyroidism   . Left upper quadrant pain 01/09/2014  . Major depressive disorder with single  episode 12/05/2011  . Major depressive disorder, single episode 12/05/2011  . Migraines   . NASH (nonalcoholic steatohepatitis)   . Respiratory infection    2/17  . Shock (HEl Jebel 09/18/2014  . Sleep apnea   . Sleep apnea   . Syncope 11/16/2014  . Thyroid disease   . TIA (transient ischemic attack)     Patient Active Problem List   Diagnosis Date Noted  . UTI (urinary tract infection) 06/11/2017  . Neurogenic pain 02/13/2017  . Chest pain 12/20/2016  . Chronic feet pain (Secondary source of pain) (Bilateral) (R>L) 10/26/2016  . Acute hepatic encephalopathy 09/23/2016  . Low back pain due to L1-2 disc extrusion (caudal) (Left) 07/11/2016  . Abdominal pain 06/28/2016  . Constipation 06/20/2016  . Hyperglycemia 06/20/2016  . Nausea without vomiting 06/06/2016  . Chronic lower extremity pain (Bilateral) 05/19/2016  . Spinal stenosis, thoracic region (T10-11) 05/19/2016  . Diabetes mellitus, insulin dependent (IDDM), uncontrolled 04/12/2016  . Chronic sacroiliac joint pain (Bilateral) (L>R) 03/23/2016  . Chronic upper extremity pain (Left) 03/10/2016  . Chronic radicular cervical pain (L) 03/10/2016  . Osteoarthritis 02/24/2016  . Allodynia 02/09/2016  . Chronic pain syndrome 01/07/2016  . Opiate withdrawal (HRuss[place] 12/22/2015  . Elevated liver enzymes 09/28/2015  . Cirrhosis of liver with ascites ([place] 09/24/2015  . Cirrhosis (HEast Conemaugh 09/24/2015  . Altered mental status 07/24/2015  . Uncontrolled diabetes mellitus ([place] 07/24/2015  . Radicular pain of thoracic region 04/06/2015  . Hepatic encephalopathy (  [place]) 03/11/2015  . Elevated sedimentation rate 03/05/2015  . Elevated C-reactive protein (CRP) 03/05/2015  . Lumbar facet syndrome (Bilateral) (R>L) 03/05/2015  . Cervical spondylosis 03/05/2015  . Lumbar spondylosis 03/05/2015  . Encounter for chronic pain management 03/05/2015  . Chronic shoulder pain (Bilateral) (R>L) 03/05/2015  . Chronic carpal tunnel syndrome (Bilateral)  03/05/2015  . Chronic hip pain (Bilateral) (L>R) 03/05/2015  . Chronic upper back pain (Bilateral) (L>R) 03/05/2015  . Osteoporosis, idiopathic 03/05/2015  . Abnormal MRI, lumbar spine (02/03/2015) 03/05/2015  . Thoracic radiculitis 02/05/2015  . Vitamin D deficiency 01/13/2015  . B12 deficiency 01/13/2015  . Folate deficiency 01/13/2015  . Subacute lumbar radiculopathy (left side) (S1 dermatome) 12/10/2014  . Drowsiness 11/16/2014  . Episode of syncope 11/16/2014  . Somnolence 11/16/2014  . Opiate use (75 MME/Day) 10/28/2014  . Long term prescription opiate use 10/28/2014  . Long term current use of opiate analgesic 10/28/2014  . Encounter for therapeutic drug level monitoring 10/28/2014  . Chronic epigastric abdominal pain 10/28/2014  . Chronic low back pain (Primary Source of Pain) (Bilateral) (R>L) 10/28/2014  . Chronic neck pain [facility] source of pain) (Bilateral) (R>L) 10/28/2014  . Ascites 09/05/2014  . NASH (nonalcoholic steatohepatitis) 09/05/2014  . Hypokalemia 09/05/2014  . Dysthymia 08/05/2014  . Other social stressor 08/05/2014  . Steatohepatitis 07/15/2014  . Type 2 diabetes mellitus ([place]) 07/12/2014  . COPD with acute exacerbation ([place]) 07/12/2014  . GERD (gastroesophageal reflux disease) 07/12/2014  . OSA on CPAP 07/12/2014  . Anxiety 07/12/2014  . 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 Lumbar and sacral osteoarthritis 03/26/2014  . Myofascial pain 03/26/2014  . Lumbar spinal stenosis 03/26/2014  . Lumbosacral spondylosis without myelopathy 03/26/2014  . 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 Lumbar central spinal stenosis (L1-2 and L4-5) 03/26/2014  . Spondylosis of lumbar region without myelopathy or radiculopathy 03/26/2014  . Breath shortness 01/09/2014  . Diastolic dysfunction with chronic heart failure ([place]) 08/06/2013  . Airway hyperreactivity 08/06/2013  . Essential (primary) hypertension 08/06/2013  . Asthma 08/06/2013  .  Clinical depression 12/05/2011    Past Surgical History:  Procedure Laterality Date  . ABDOMINAL HYSTERECTOMY    . CHOLECYSTECTOMY N/A 07/15/2014   Procedure: LAPAROSCOPIC CHOLECYSTECTOMY with liver biopsy ;  Surgeon: Sherri Rad, MD;  Location: [facility] ORS;  Service: General;  Laterality: N/A;  . COLONOSCOPY WITH PROPOFOL N/A 06/23/2014   Procedure: COLONOSCOPY WITH PROPOFOL;  Surgeon: Lollie Sails, MD;  Location: [facility] ENDOSCOPY;  Service: Endoscopy;  Laterality: N/A;  . ERCP N/A 07/16/2014   Procedure: ENDOSCOPIC RETROGRADE CHOLANGIOPANCREATOGRAPHY (ERCP);  Surgeon: Clarene Essex, MD;  Location: [facility] ENDOSCOPY;  Service: Endoscopy;  Laterality: N/A;  . ERCP N/A 10/03/2014   Procedure: ENDOSCOPIC RETROGRADE CHOLANGIOPANCREATOGRAPHY (ERCP);  Surgeon: Hulen Luster, MD;  Location: [facility] ENDOSCOPY;  Service: Gastroenterology;  Laterality: N/A;  . ESOPHAGOGASTRODUODENOSCOPY N/A 06/23/2014   Procedure: ESOPHAGOGASTRODUODENOSCOPY (EGD);  Surgeon: Lollie Sails, MD;  Location: [facility] ENDOSCOPY;  Service: Endoscopy;  Laterality: N/A;  . ESOPHAGOGASTRODUODENOSCOPY N/A 06/30/2016   Procedure: ESOPHAGOGASTRODUODENOSCOPY (EGD);  Surgeon: Jonathon Bellows, MD;  Location: [facility] ENDOSCOPY;  Service: Endoscopy;  Laterality: N/A;  . ESOPHAGOGASTRODUODENOSCOPY (EGD) WITH PROPOFOL N/A 12/01/2015   Procedure: ESOPHAGOGASTRODUODENOSCOPY (EGD) WITH PROPOFOL;  Surgeon: Lollie Sails, MD;  Location: [facility] ENDOSCOPY;  Service: Endoscopy;  Laterality: N/A;  . TUBAL LIGATION    . WISDOM TOOTH EXTRACTION      Prior to Admission medications   Medication Sig Start Date End Date Taking? Authorizing Provider  albuterol (PROVENTIL HFA;VENTOLIN HFA) 108 (90 Base) MCG/ACT inhaler Inhale 2 puffs into the lungs every 6 (six) hours as needed for wheezing or shortness of breath.    [provider]  ALPRAZolam Duanne Moron) 1 MG tablet Take 1 mg by mouth 3 (three) times daily as needed.     [provider]  cholecalciferol (VITAMIN  D) 1000 units tablet Take 2,000 Units by mouth daily.    [provider]  citalopram (CELEXA) 40 MG tablet Take 40 mg by mouth daily.  07/24/15   [provider]  diclofenac sodium (VOLTAREN) 1 % GEL Apply 4 g topically every 8 (eight) hours as needed. Patient taking differently: Apply 4 g topically 2 (two) times daily.  12/06/16 08/20/18  Milinda Pointer, MD  dicyclomine (BENTYL) 10 MG capsule Take 10 mg by mouth 2 (two) times daily. 07/23/18   [provider]  fluticasone (FLONASE) 50 MCG/ACT nasal spray Place 1-2 sprays into both nostrils daily as needed for rhinitis.     [provider]  Folate-B12-Intrinsic Factor (INTRINSI B12-FOLATE) [phone] MCG-MCG-MG TABS Take 1 tablet by mouth daily.     [provider]  gabapentin (NEURONTIN) 300 MG capsule Take 900 mg by mouth 3 (three) times daily.     [provider]  insulin regular human CONCENTRATED (HUMULIN R) 500 UNIT/ML injection Inject 100-200 Units into the skin 2 (two) times a day. Inject 200 units in the morning, inject 100 units nightly. Based on glucose readings    [provider]  lactulose (CHRONULAC) 10 GM/15ML solution Take 30 mLs (20 g total) by mouth 3 (three) times daily. Patient taking differently: Take 40 g by mouth 4 (four) times daily.  10/26/16   Nicholes Mango, MD  magnesium oxide (MAG-OX) 400 MG tablet Take 400 mg by mouth daily.    [provider]  nystatin (MYCOSTATIN/NYSTOP) powder Apply 1 g topically 3 (three) times daily as needed. For irritation 10/26/16   Gouru, Illene Silver, MD  Oxycodone HCl 10 MG TABS Take 10 mg by mouth 3 (three) times daily.     [provider]  pantoprazole (PROTONIX) 40 MG tablet Take 40 mg by mouth 2 (two) times daily before a meal.    [provider]  potassium chloride SA (K-DUR,KLOR-CON) 20 MEQ tablet Take 20 mEq by mouth daily as needed. For high blood pressure    [provider]  promethazine  (PHENERGAN) 12.5 MG tablet Take 2 tablets (25 mg total) by mouth every 6 (six) hours as needed for nausea or vomiting. Patient taking differently: Take 12.5-25 mg by mouth every 6 (six) hours as needed for nausea or vomiting.  09/25/16   Dustin Flock, MD  rifaximin (XIFAXAN) 550 MG TABS tablet Take 550 mg by mouth daily.     [provider]  spironolactone (ALDACTONE) 100 MG tablet Take 400 mg by mouth daily.     [provider]  vitamin B-12 (CYANOCOBALAMIN) 1000 MCG tablet Take 2,000 mcg by mouth daily.    [provider]  SUMAtriptan (IMITREX) 50 MG tablet Take 50 mg by mouth every 2 (two) hours as needed. For migraines  03/17/11  [provider]    Allergies Nsaids, Tape, and Vicodin [hydrocodone-acetaminophen]  Family History  Problem Relation Age of Onset  . Lung cancer Mother   . Ulcers Father   . Emphysema Father   . Heart disease Sister   . Ulcers Sister   . Heart disease Brother     Social History  Social History   Tobacco Use  . Smoking status: Never Smoker  . Smokeless tobacco: Never Used  Substance Use Topics  . Alcohol use: No    Comment: occ  . Drug use: No    Review of Systems Constitutional: No fever/chills Eyes: No visual changes. ENT: No sore throat. Cardiovascular: Denies chest pain. Respiratory: Denies shortness of breath. Gastrointestinal: Positive for abdominal pain and distention. Genitourinary: Negative for dysuria. Musculoskeletal: Negative for back pain. Skin: Negative for rash. Neurological: Negative for headaches, focal weakness or numbness.  ____________________________________________   PHYSICAL EXAM:  VITAL SIGNS: ED Triage Vitals  Enc Vitals Group     BP 04/12/19 1555 113/60     Pulse Rate 04/12/19 1555 93     Resp 04/12/19 1555 (!) 22     Temp 04/12/19 1555 99 F (37.2 C)     Temp Source 04/12/19 1555 Oral     SpO2 04/12/19 1555 98 %     Weight 04/12/19 1603 243 lb (110.2 kg)     Height  04/12/19 1603 5' 2"  (1.575 m)     Head Circumference --      Peak Flow --      Pain Score 04/12/19 1603 9   Constitutional: Alert and oriented.  Eyes: Conjunctivae are normal.  ENT      Head: Normocephalic and atraumatic.      Nose: No congestion/rhinnorhea.      Mouth/Throat: Mucous membranes are moist.      Neck: No stridor. Hematological/Lymphatic/Immunilogical: No cervical lymphadenopathy. Cardiovascular: Normal rate, regular rhythm.  No murmurs, rubs, or gallops.  Respiratory: Normal respiratory effort without tachypnea nor retractions. Breath sounds are clear and equal bilaterally. No wheezes/rales/rhonchi. Gastrointestinal: Soft and non tender. No rebound. No guarding.  Genitourinary: Deferred. Foley catheter Musculoskeletal: Normal range of motion in all extremities. No lower extremity edema. Neurologic:  Normal speech and language. No gross focal neurologic deficits are appreciated.  Skin:  Skin is warm, dry and intact. No rash noted. Psychiatric: Mood and affect are normal. Speech and behavior are normal. Patient exhibits appropriate insight and judgment.  ____________________________________________    LABS (pertinent positives/negatives)  CBC wbc 2.8, hgb 11.3, plt 75 CMP na 130, k 3.8, glu 221, cr 0.58 ____________________________________________   EKG  None  ____________________________________________    RADIOLOGY  None  ____________________________________________   PROCEDURES  Procedures  ____________________________________________   INITIAL IMPRESSION / ASSESSMENT AND PLAN / ED COURSE  Pertinent labs & imaging results that were available during my care of the patient were reviewed by me and considered in my medical decision making (see chart for details).   Patient presented to the emergency department today with concerns for abdominal pain distention and fluid retention.  Patient was seen at [facility] emergency department earlier today.   It does not sound like anything is changed.  She does come in with Foley catheter in place for urinary retention that she was also having at that time.  I did review work-up at outside hospital.  Basic blood work was done here as well.  I did discuss with patient possible SPB however did discuss negative pro calcitonin at outside hospital.  Bedside ultrasound here shows a very small amount of ascites.  I did discuss with patient difficulty of obtaining a diagnostic paracentesis as well as the risk of paracentesis.  Patient does have slight thrombocytopenia.  This point patient felt comfortable deferring.  She was given IV Lasix and did have some output.  I discussed with  the patient importance of following up with her physicians at [facility].  ___________________________________________   FINAL CLINICAL IMPRESSION(S) / ED DIAGNOSES  Final diagnoses:  Abdominal pain, unspecified abdominal location  Fluid retention     Note: This dictation was prepared with Dragon dictation. Any transcriptional errors that result from this process are unintentional     Nance Pear, MD 04/12/19 1927

## 2019-04-12 NOTE — ED Triage Notes (Addendum)
Per patient's report, patient reported an increase of 5LB of weight and increased swelling of bilateral lower extremities. Patient c/o increased in abdominal swelling. Patient has a history of cirrhosis. Patient c/o bilateral kidney pain and liver pain. Patient was seen at [facility] last night and had a catheter inserted for retention.

## 2019-04-12 NOTE — Discharge Instructions (Addendum)
Please follow-up with urology as referred by [facility].  Please discuss your fluid retention with your primary care and liver specialist. Please seek medical attention for any high fevers, chest pain, shortness of breath, change in behavior, persistent vomiting, bloody stool or any other new or concerning symptoms.

## 2019-12-31 IMAGING — DX PORTABLE CHEST - 1 VIEW
1 series · 1 of 1 positions shown · non-contrast
Comparison: 07/10/2017

CLINICAL DATA: Shortness of breath

EXAM:
PORTABLE CHEST 1 VIEW

[chest ap]
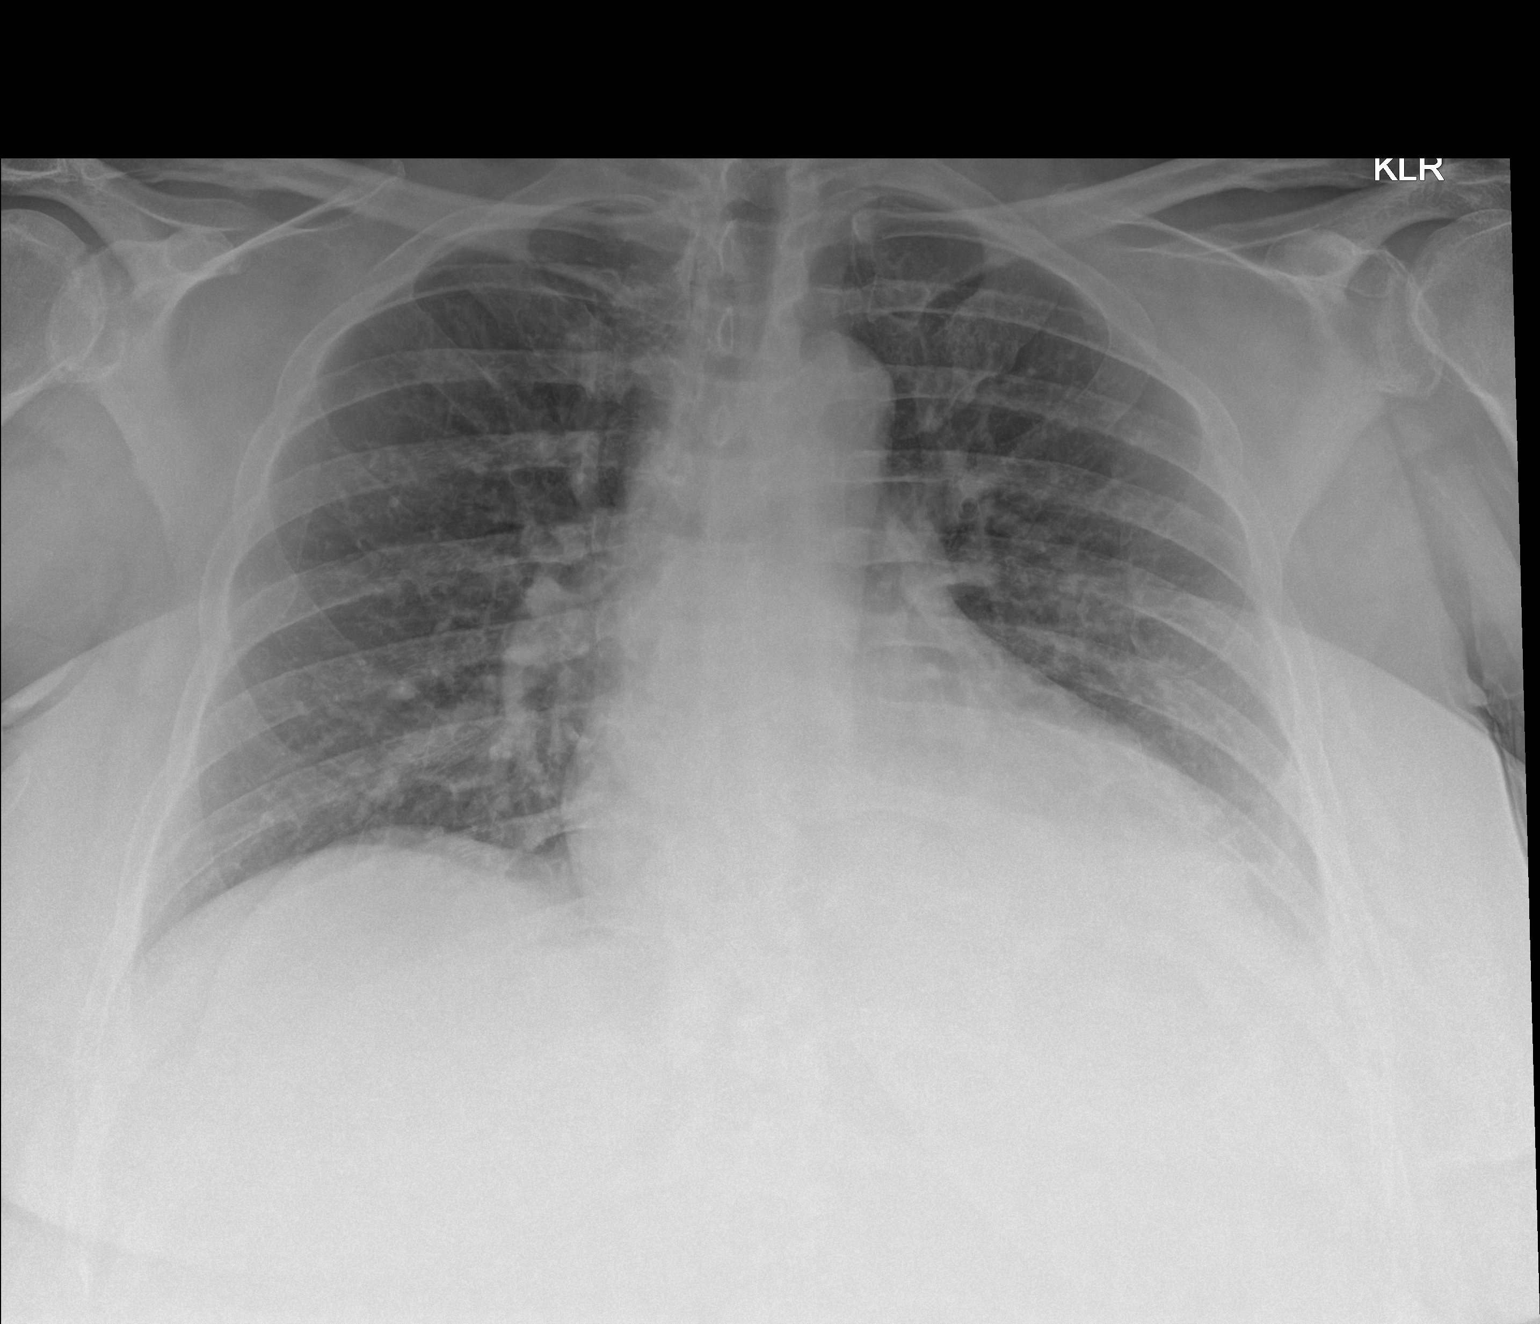

[1 of 1 positions shown; findings below may reference images not displayed]

FINDINGS: Cardiomegaly with slight central congestion. Suspected small left
effusion with left basilar airspace disease. No pneumothorax
IMPRESSION: 1. Cardiomegaly with central vascular congestion.
2. Probable small left effusion with left basilar atelectasis or
infiltrate.

## 2020-01-02 IMAGING — DX PORTABLE CHEST - 1 VIEW
1 series · 1 of 1 positions shown · non-contrast
Comparison: Chest x-ray dated 08/17/2018.

CLINICAL DATA: Increased shortness of breath, weight gain since
last paracentesis 2 days ago.

EXAM:
PORTABLE CHEST 1 VIEW

[chest ap]
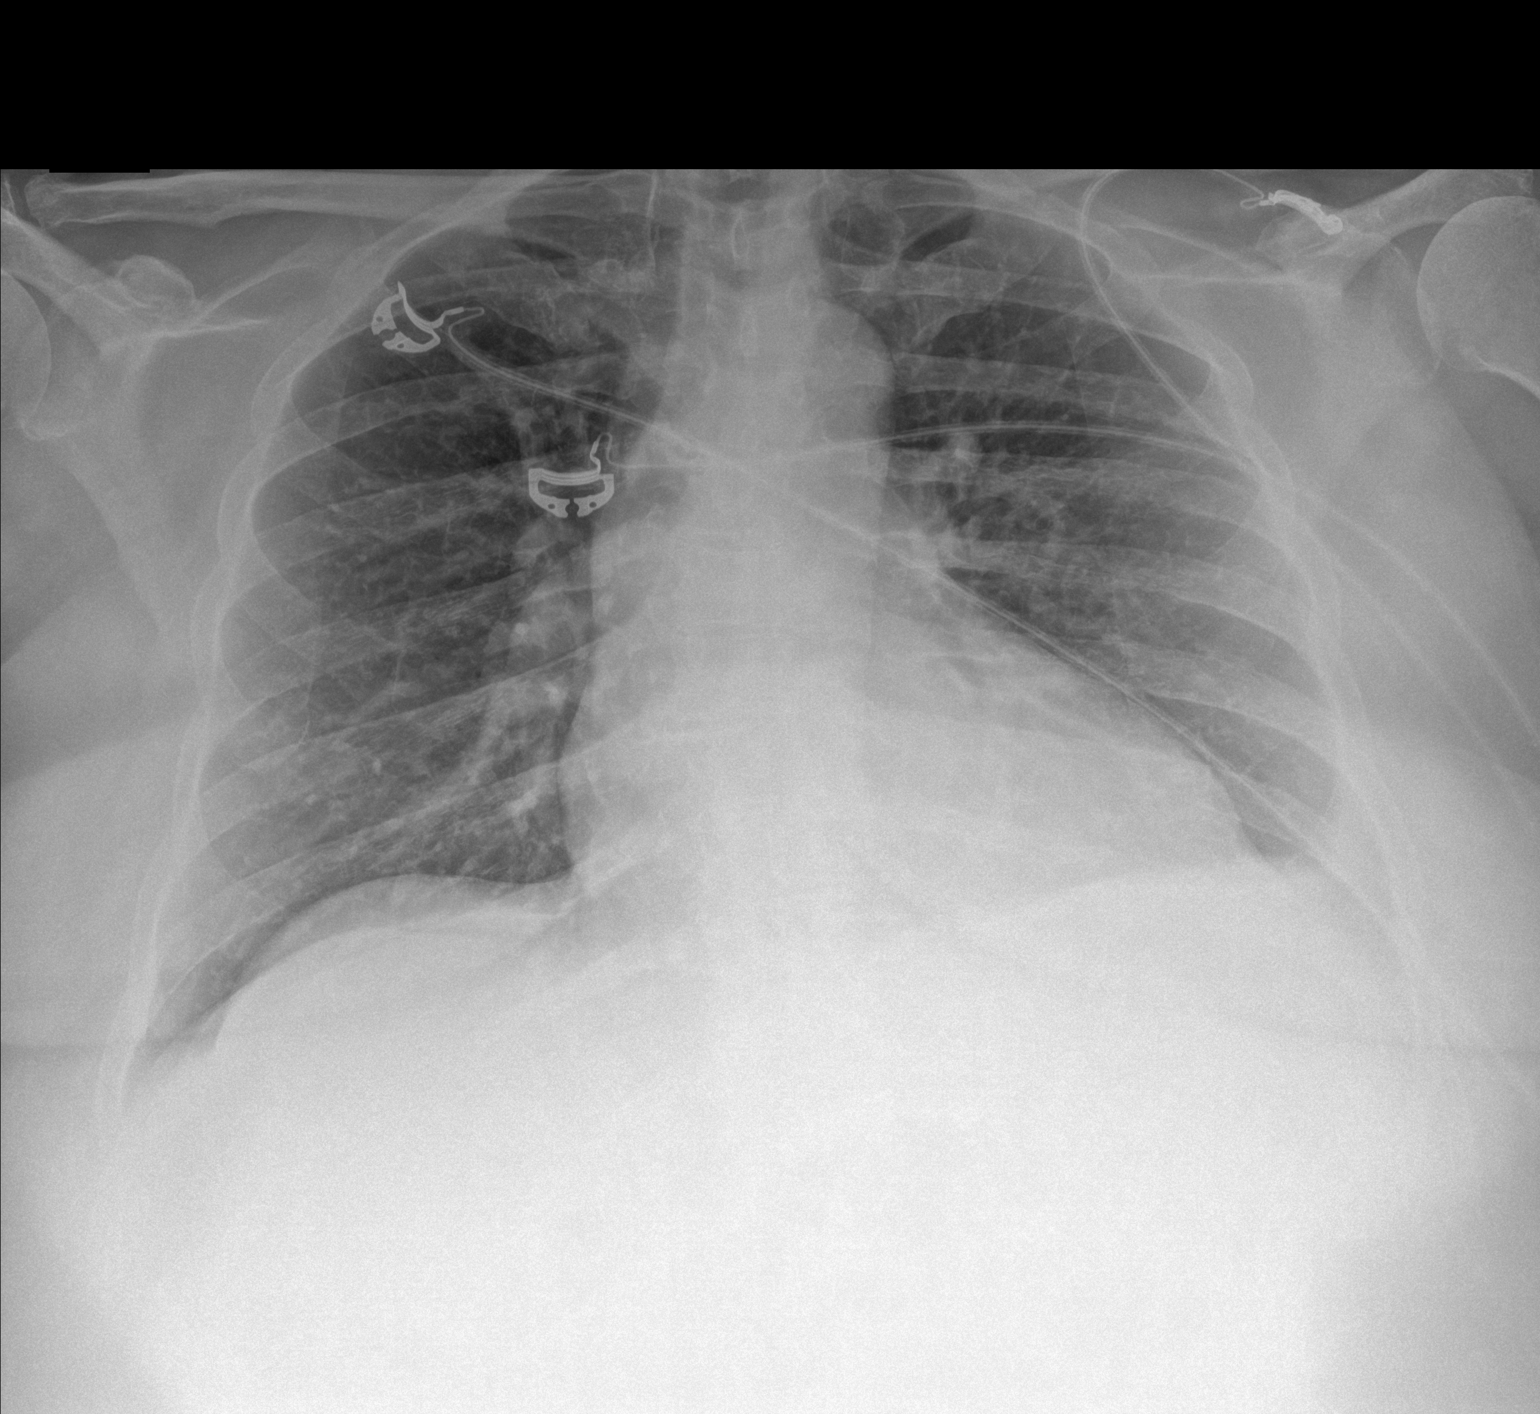

[1 of 1 positions shown; findings below may reference images not displayed]

FINDINGS: Reaccumulation of pleural effusion at the LEFT costophrenic angle.
RIGHT lung is clear. No pneumothorax seen. Borderline cardiomegaly,
stable.
IMPRESSION: Reaccumulation of small pleural effusion at the LEFT costophrenic
angle.

## 2020-01-03 IMAGING — CT CT ABDOMEN AND PELVIS WITH CONTRAST
2 of 5 series · 17 of 46 positions shown, 19 images · IV contrast (APPLIED)
Comparison: 07/24/2018

CLINICAL DATA: Diffuse abdominal pain

EXAM:
CT ABDOMEN AND PELVIS WITH CONTRAST
TECHNIQUE: Multidetector CT imaging of the abdomen and pelvis was performed
using the standard protocol following bolus administration of
intravenous contrast.
CONTRAST:  100mL OMNIPAQUE IOHEXOL 300 MG/ML  SOLN

[Series 2: axial st · axial · 0.84mm/px · z∈[-326,+114]mm · 14 of 100 slices shown, 16 images]
[im 6/100  soft-tissue]
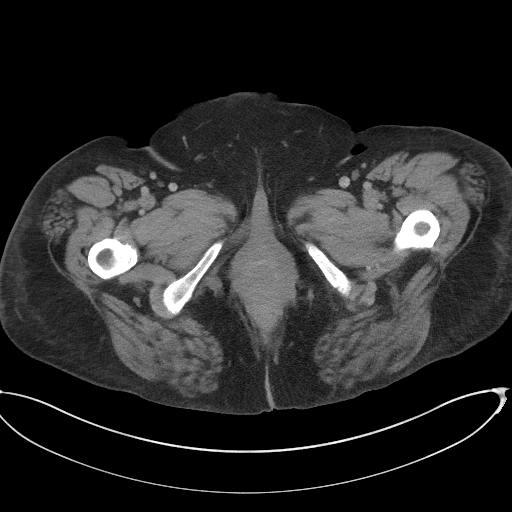
[im 6/100  bone]
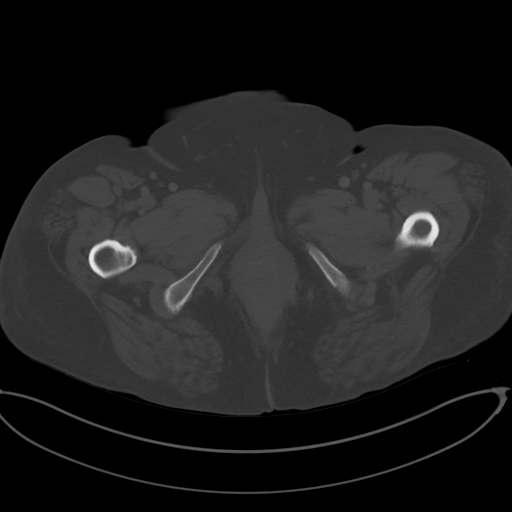
[im 12/100  soft-tissue]
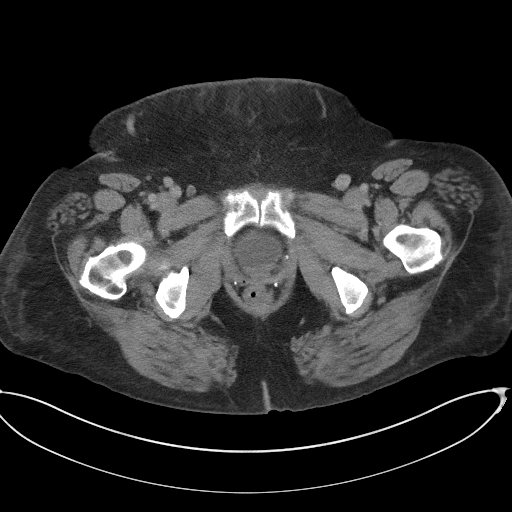
[im 18/100  soft-tissue]
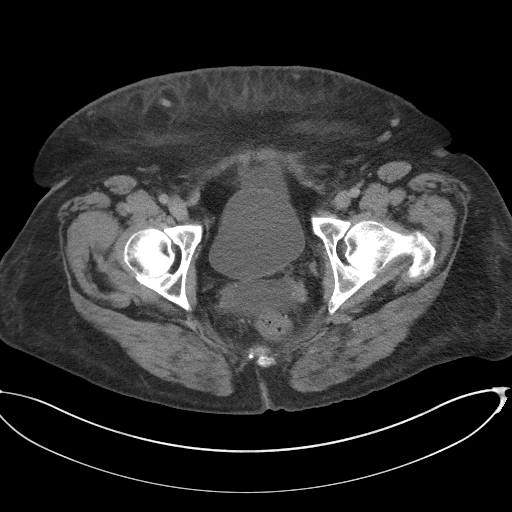
[im 30/100  soft-tissue]
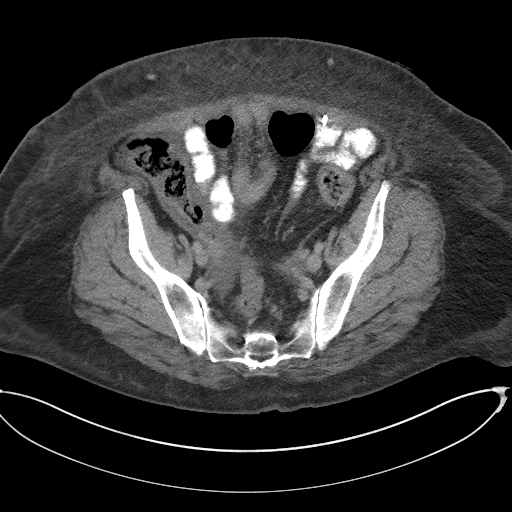
[im 35/100  soft-tissue]
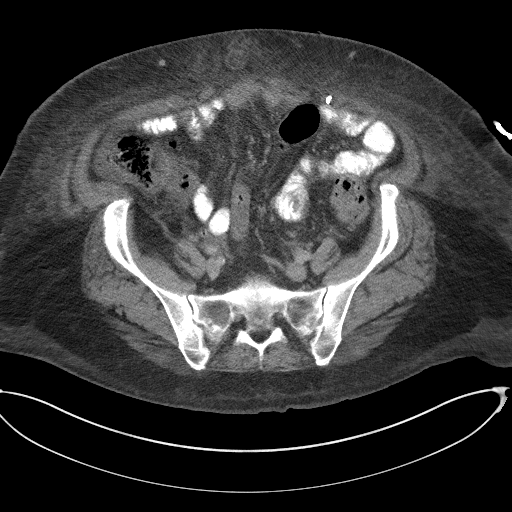
[im 41/100  soft-tissue]
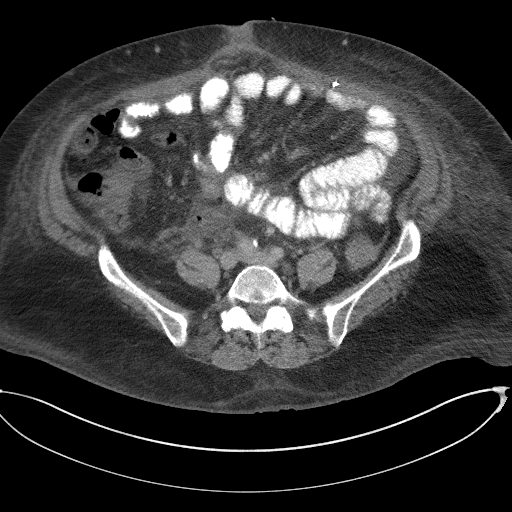
[im 47/100  soft-tissue]
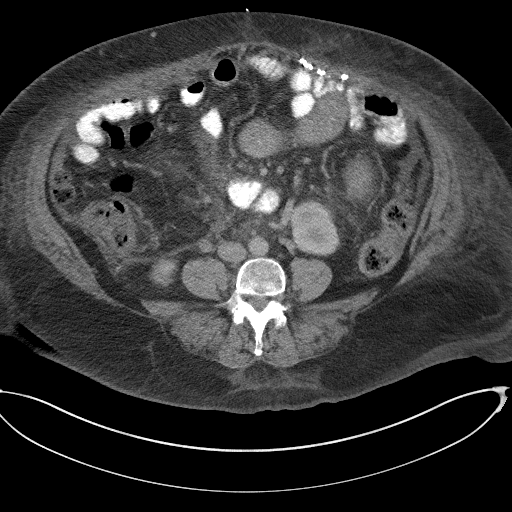
[im 53/100  soft-tissue]
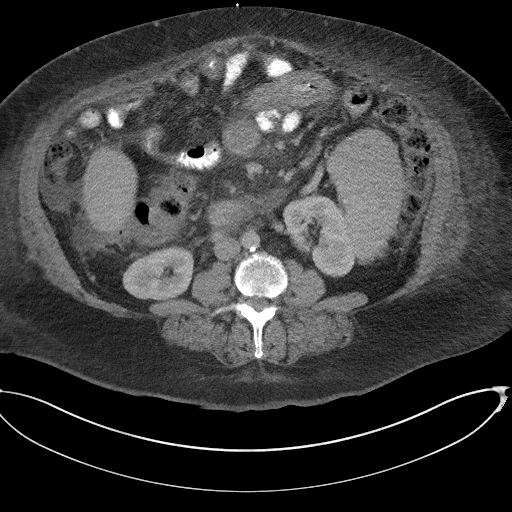
[im 59/100  soft-tissue]
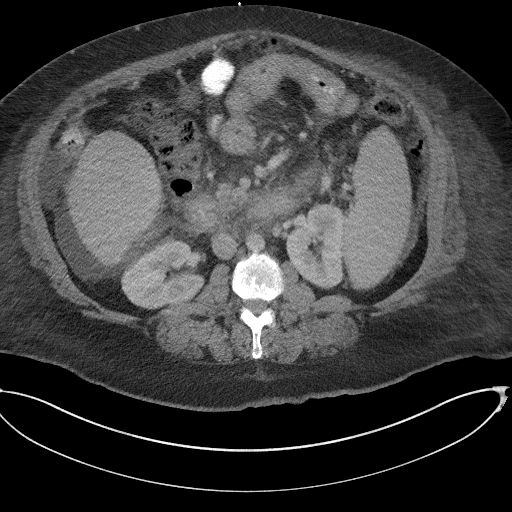
[im 59/100  bone]
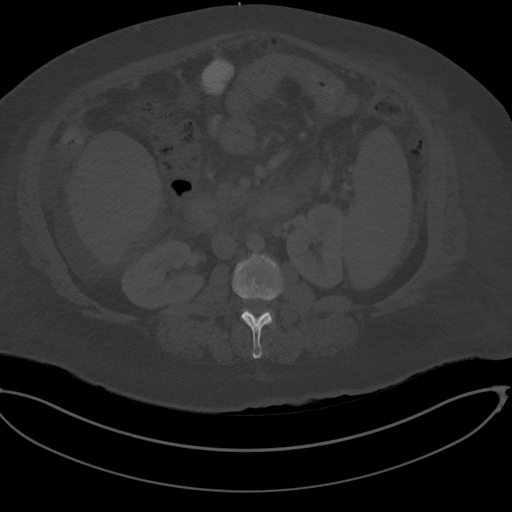
[im 65/100  soft-tissue]
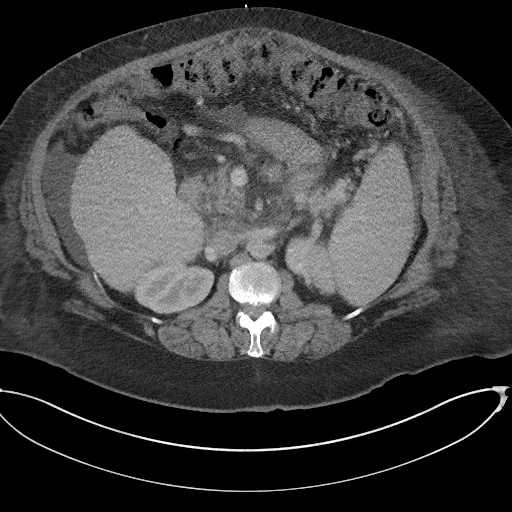
[im 76/100  soft-tissue]
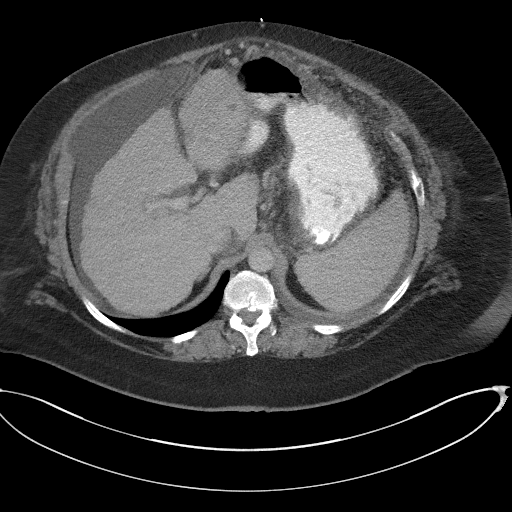
[im 82/100  soft-tissue]
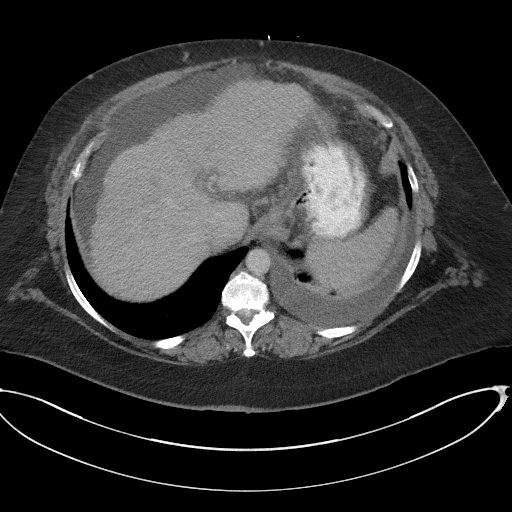
[im 88/100  soft-tissue]
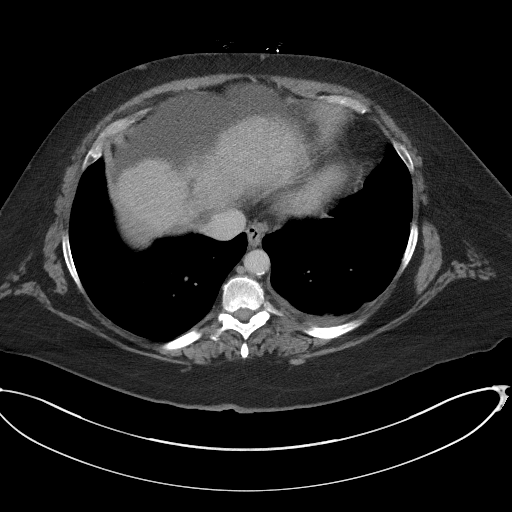
[im 94/100  soft-tissue]
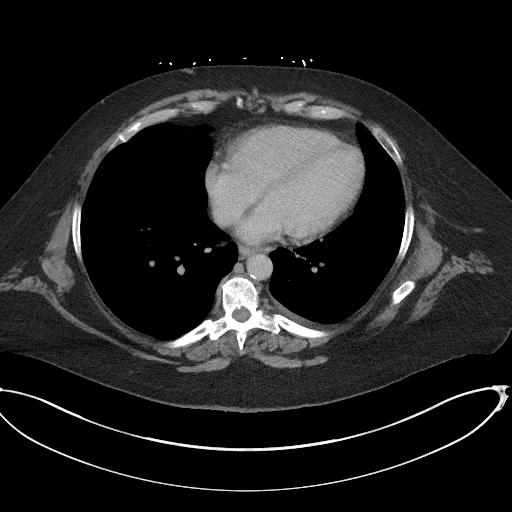

[Series 5: coronal st · coronal · 0.94mm/px · 3 of 122 slices shown]
[im 41/122  soft-tissue]
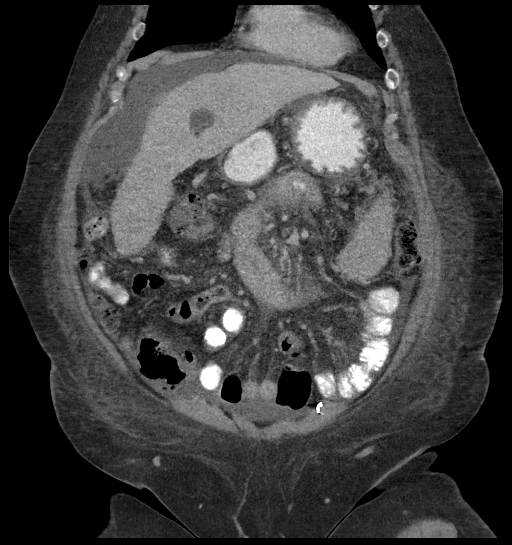
[im 54/122  soft-tissue]
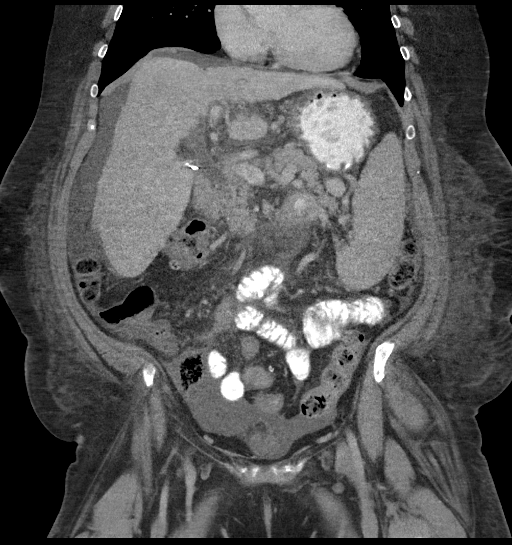
[im 68/122  soft-tissue]
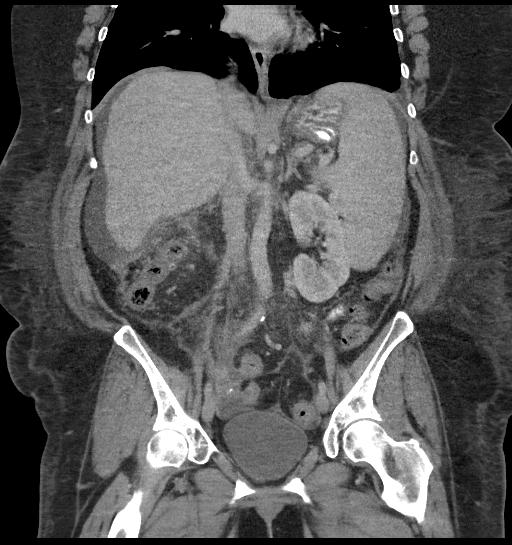

[17 of 46 positions shown; findings below may reference images not displayed]

FINDINGS: Lower chest: Small left pleural effusion is noted. No focal
infiltrate is noted.

Hepatobiliary: Liver demonstrates cirrhotic change with nodularity.
Cysts are noted throughout the liver. The gallbladder has been
surgically removed. Peri hepatic fluid is noted consistent with mild
ascites.

Pancreas: Unremarkable. No pancreatic ductal dilatation or
surrounding inflammatory changes.

Spleen: The spleen is enlarged consistent with a degree of portal
hypertension.

Adrenals/Urinary Tract: Adrenal glands are within normal limits.
Kidneys demonstrate no renal calculi or obstructive changes. The
bladder is partially distended.

Stomach/Bowel: The appendix is within normal limits. No obstructive
or inflammatory changes of the large or small bowel is seen.

Vascular/Lymphatic: Aortic atherosclerosis. No enlarged abdominal or
pelvic lymph nodes. Multiple abdominal varices are seen.

Reproductive: Status post hysterectomy. No adnexal masses.

Other: Mild ascites is noted. Mild changes of anasarca are noted as
well.

Musculoskeletal: No acute bony abnormality is seen.
IMPRESSION: Small left pleural effusion.

Mild ascites although improved following recent paracentesis.

Changes of portal hypertension and cirrhosis. Multiple varices are
seen.

## 2020-01-03 IMAGING — US PARACENTESIS WITH ULTRASOUND GUIDANCE
1 series · 5 of 5 positions shown · non-contrast
Comparison: none

INDICATION: Cirrhosis and reaccumulation of symptomatic ascites.

[Series 1: paracentesis with ultrasound guidance · 5 of 5 slices shown]
[im 1/5]
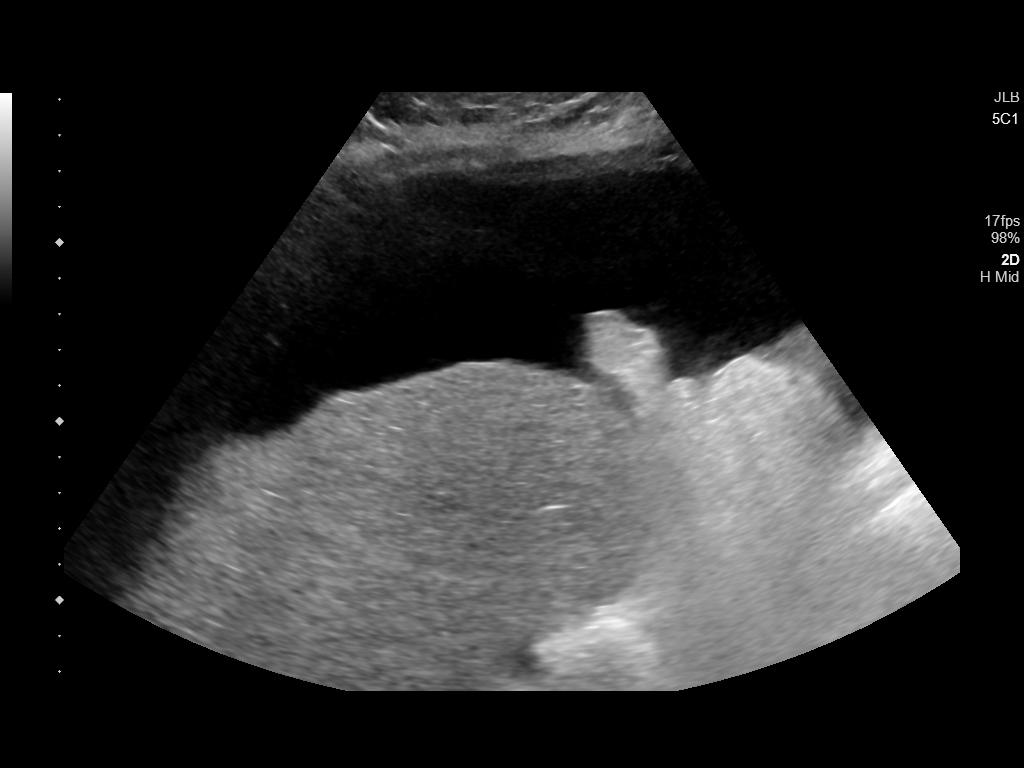
[im 2/5]
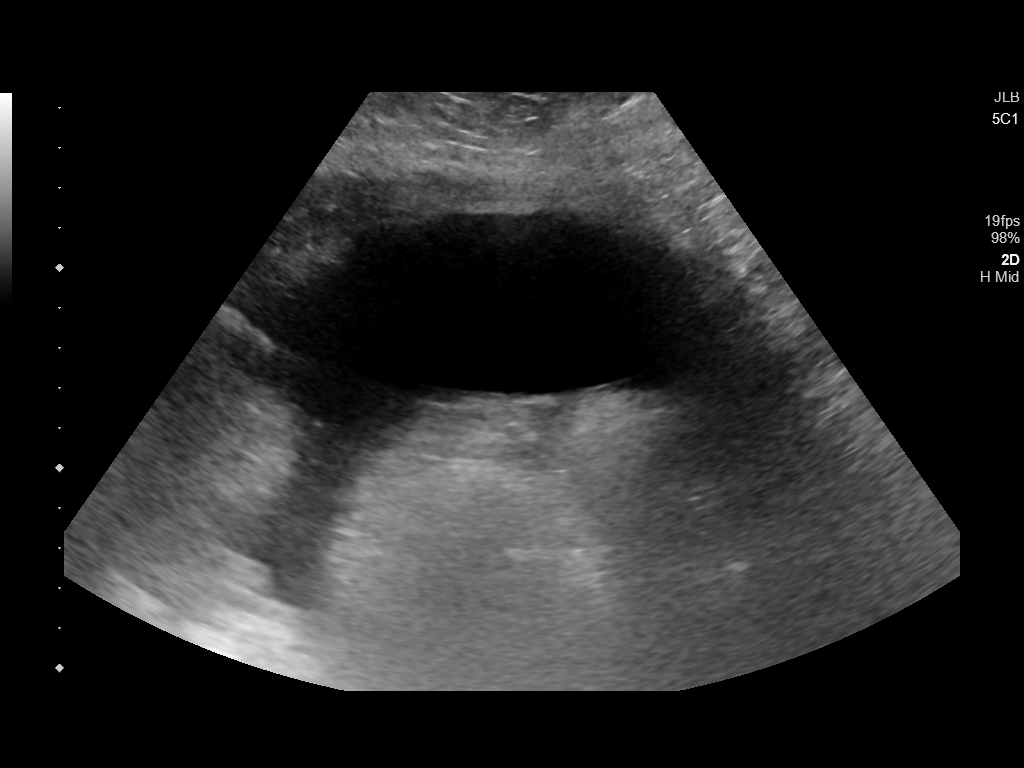
[im 3/5]
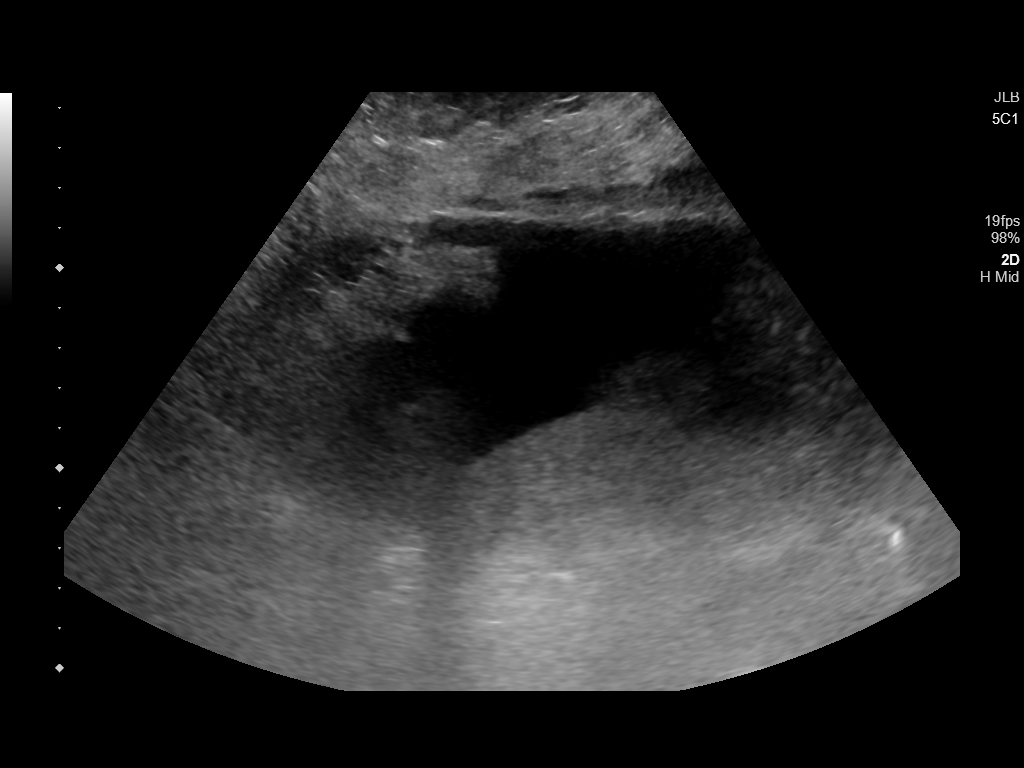
[im 4/5]
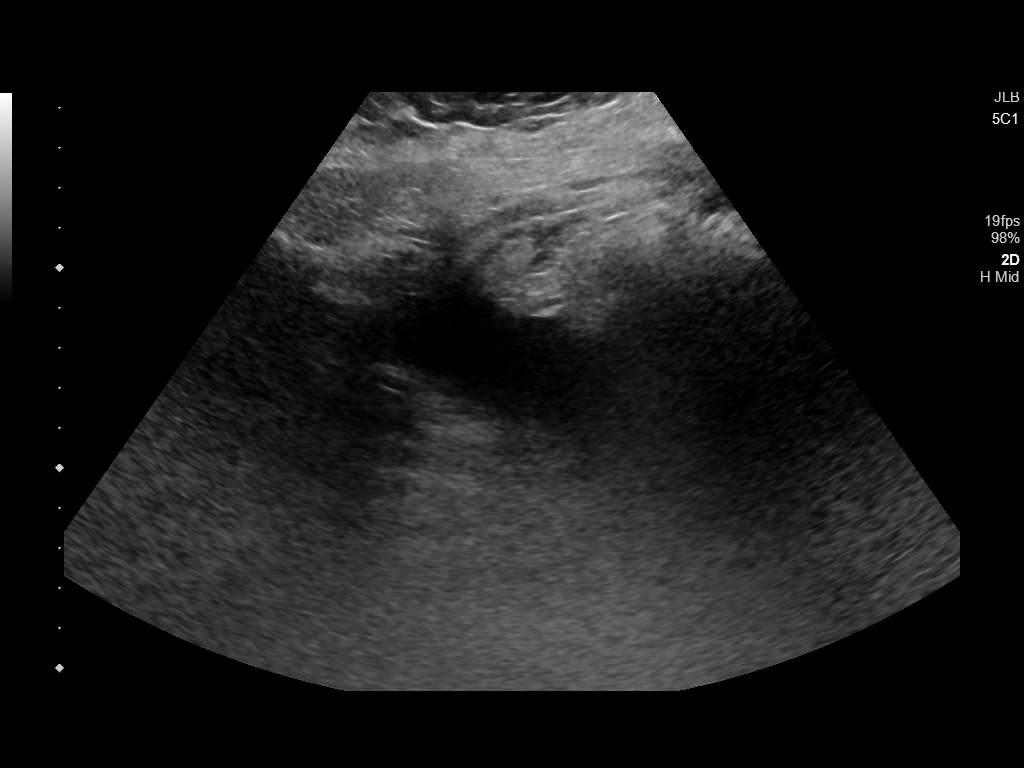
[im 5/5]
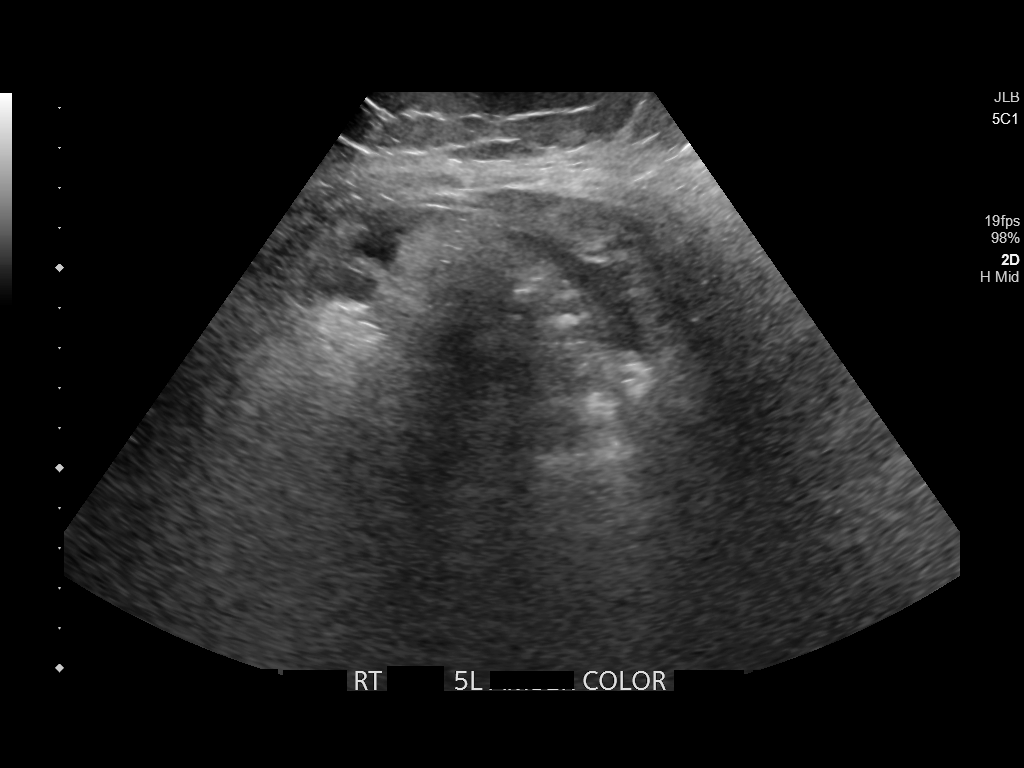

[5 of 5 positions shown; findings below may reference images not displayed]

EXAM:
ULTRASOUND GUIDED PARACENTESIS

MEDICATIONS:
None.

COMPLICATIONS:
None immediate.

PROCEDURE:
Informed written consent was obtained from the patient after a
discussion of the risks, benefits and alternatives to treatment. A
timeout was performed prior to the initiation of the procedure.

Initial ultrasound was performed to localize ascites. The right
lower abdomen was prepped and draped in the usual sterile fashion.
1% lidocaine was used for local anesthesia.

Following this, a 6 Fr Safe-T-Centesis catheter was introduced. An
ultrasound image was saved for documentation purposes. The
paracentesis was performed. The catheter was removed and a dressing
was applied. The patient tolerated the procedure well without
immediate post procedural complication.
FINDINGS: A total of approximately 5 L of yellowish fluid was removed.
IMPRESSION: Successful ultrasound-guided paracentesis yielding 5 liters of
peritoneal fluid.

## 2020-01-03 IMAGING — DX PORTABLE CHEST - 1 VIEW
1 series · 1 of 1 positions shown · non-contrast
Comparison: Single-view of the chest 08/19/2018 and 08/17/2018. PA
and lateral chest 07/10/2017.

CLINICAL DATA: History of ascites and left pleural effusion.

EXAM:
PORTABLE CHEST 1 VIEW

[chest ap]
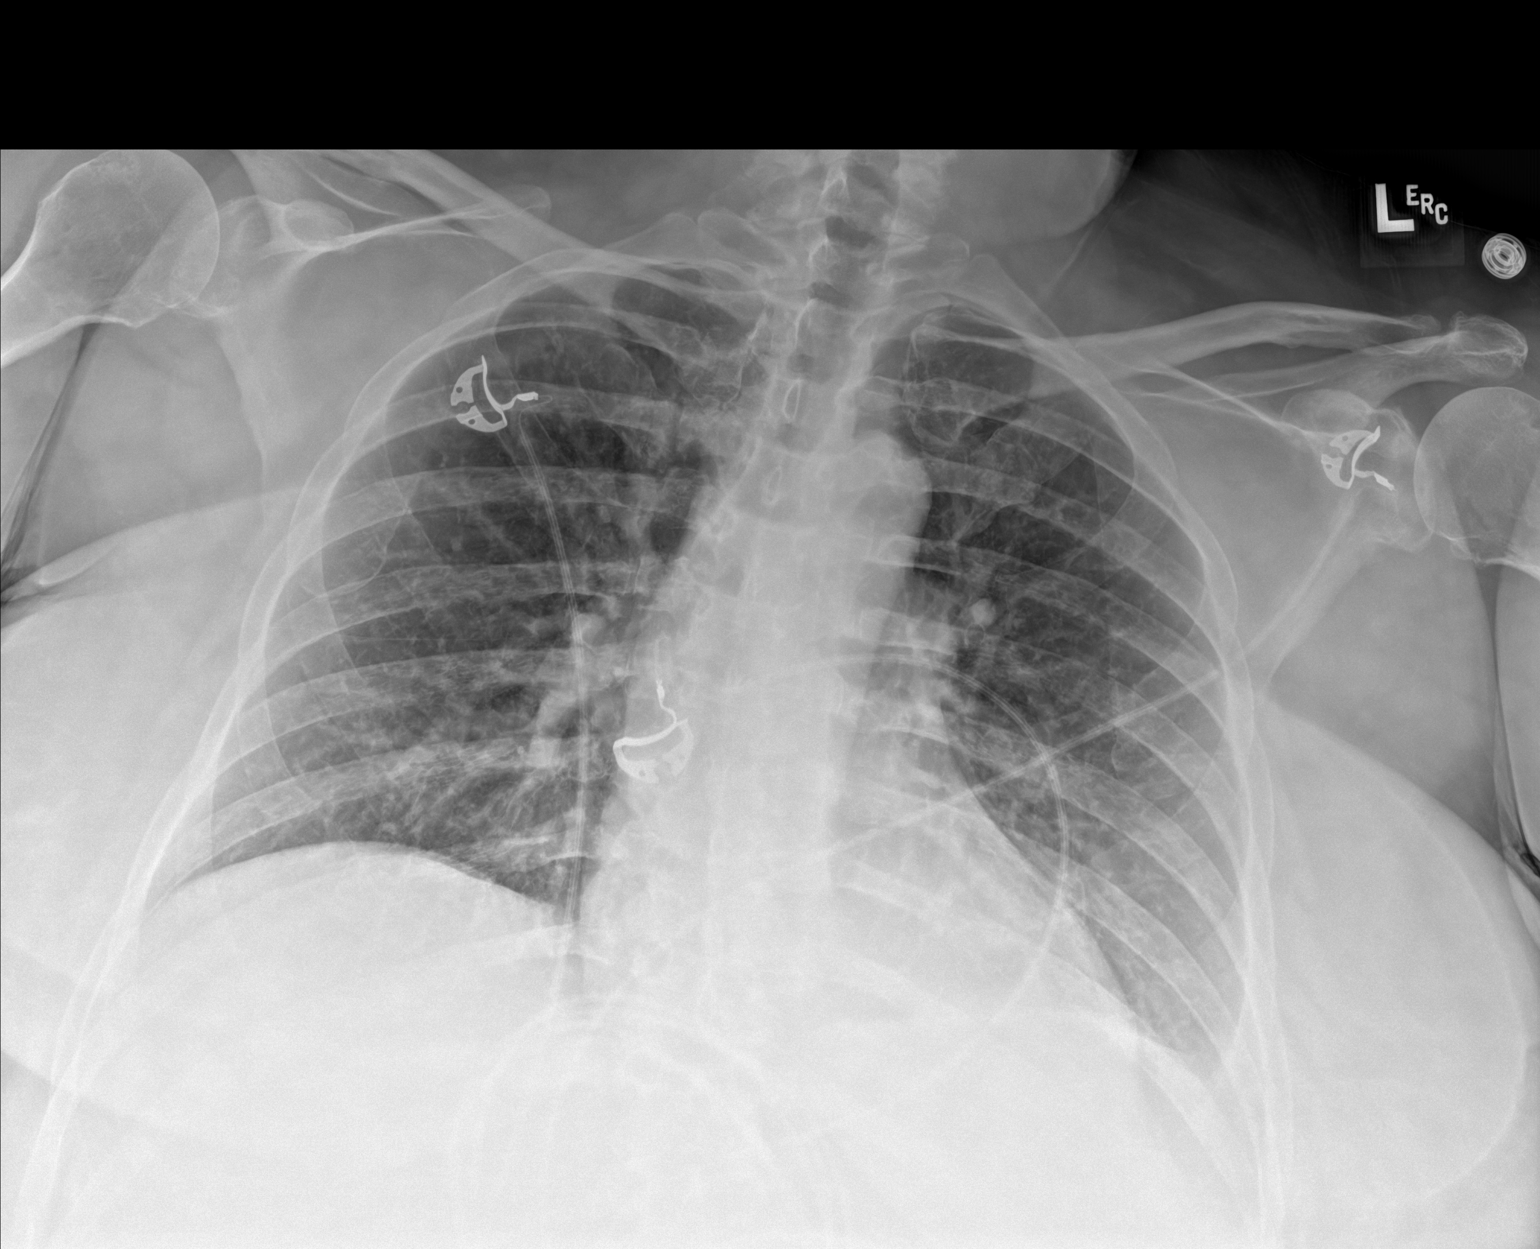

[1 of 1 positions shown; findings below may reference images not displayed]

FINDINGS: Tiny left pleural effusion seen on the most recent examination and
mild left basilar atelectasis are unchanged. Right lung is clear. No
pneumothorax. No right pleural effusion. Heart size is normal.
IMPRESSION: No change in a tiny left pleural effusion and basilar atelectasis
since the most recent exam.

## 2020-01-04 IMAGING — US ULTRASOUND ABDOMEN LIMITED
1 series · 14 of 25 positions shown · non-contrast
Comparison: Abdominal CT earlier today

CLINICAL DATA: Pneumobilia

EXAM:
ULTRASOUND ABDOMEN LIMITED RIGHT UPPER QUADRANT

[Series 1: ultrasound abdomen limited · 0.32mm/px · 14 of 36 slices shown]
[im 1/36]
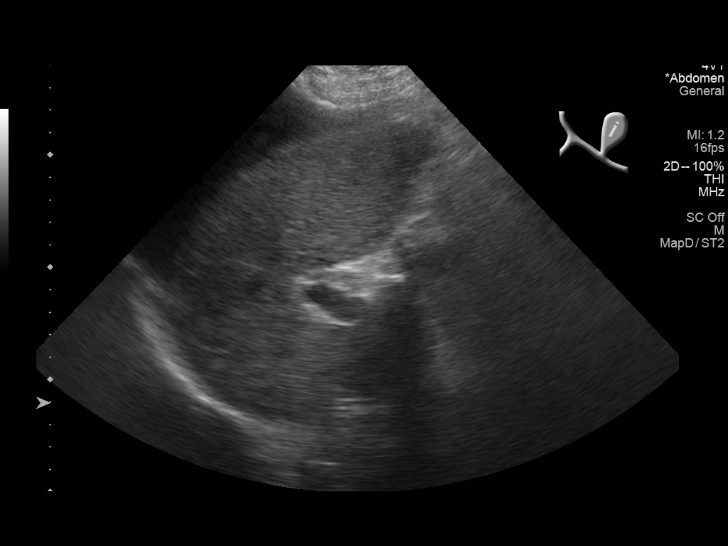
[im 3/36]
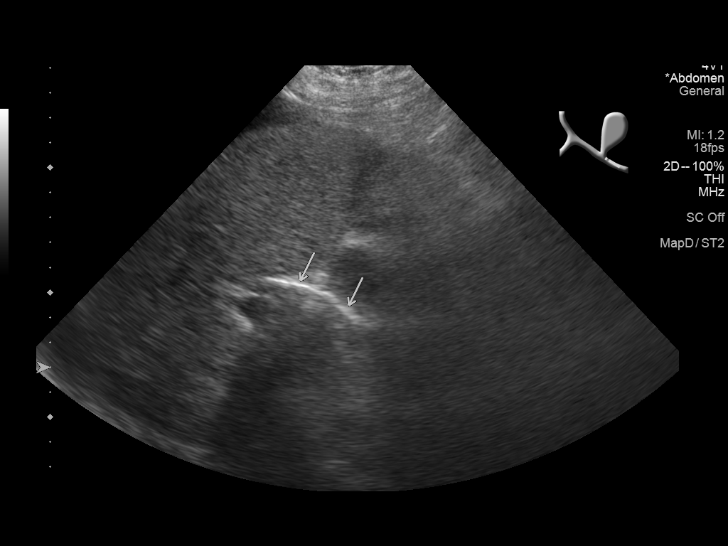
[im 6/36]
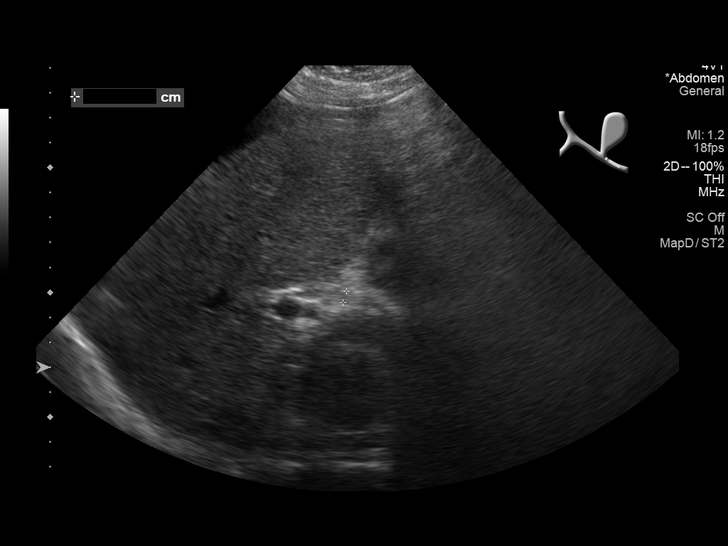
[im 9/36]
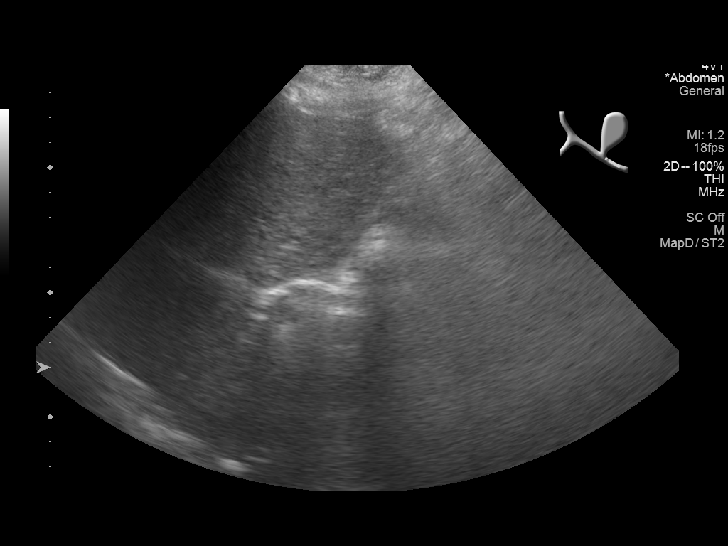
[im 12/36]
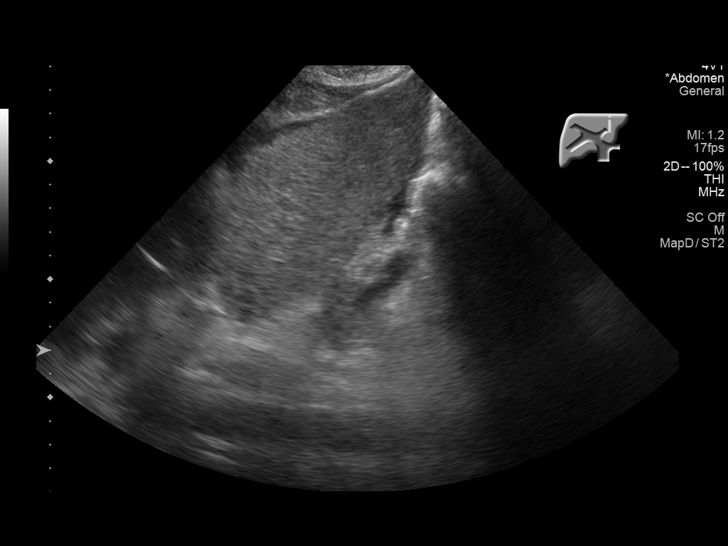
[im 14/36]
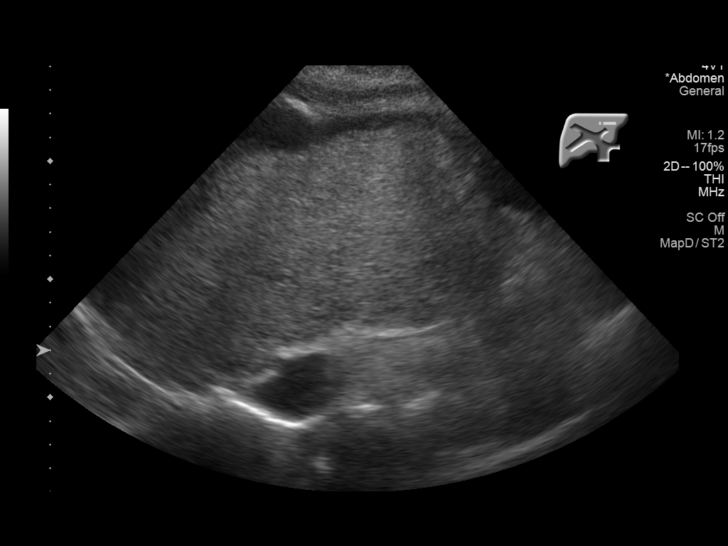
[im 17/36]
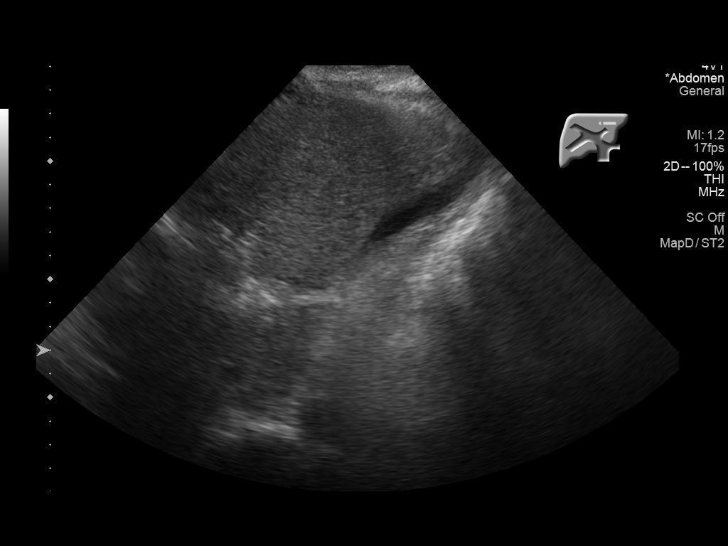
[im 19/36]
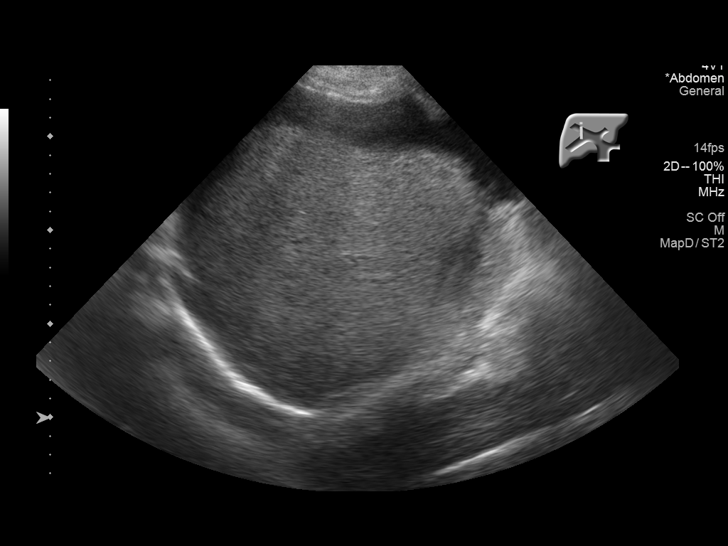
[im 22/36]
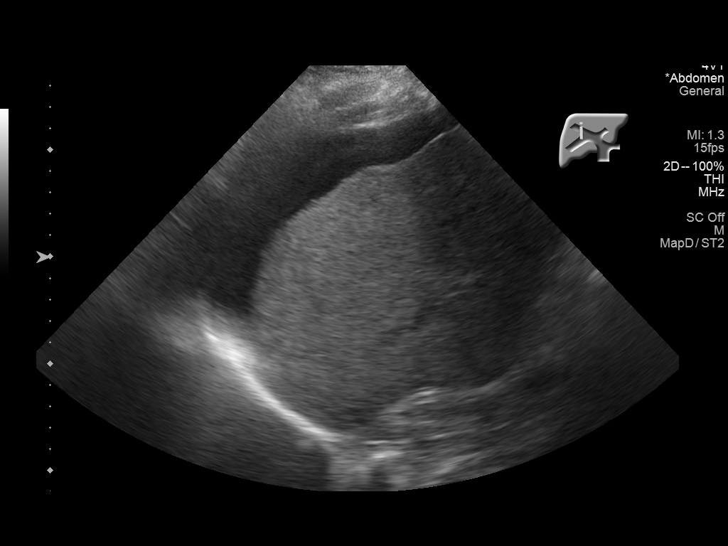
[im 24/36]
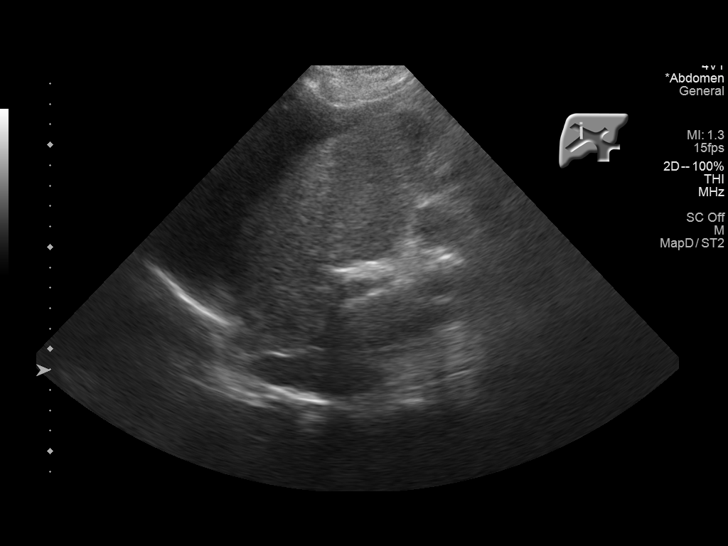
[im 27/36]
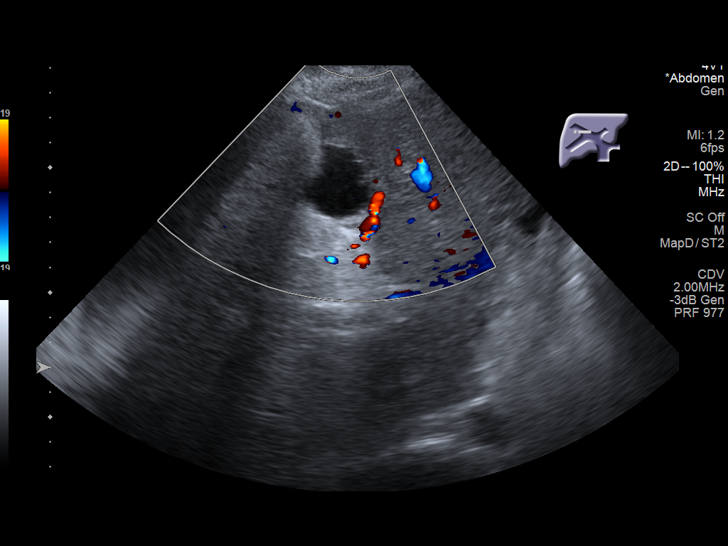
[im 30/36]
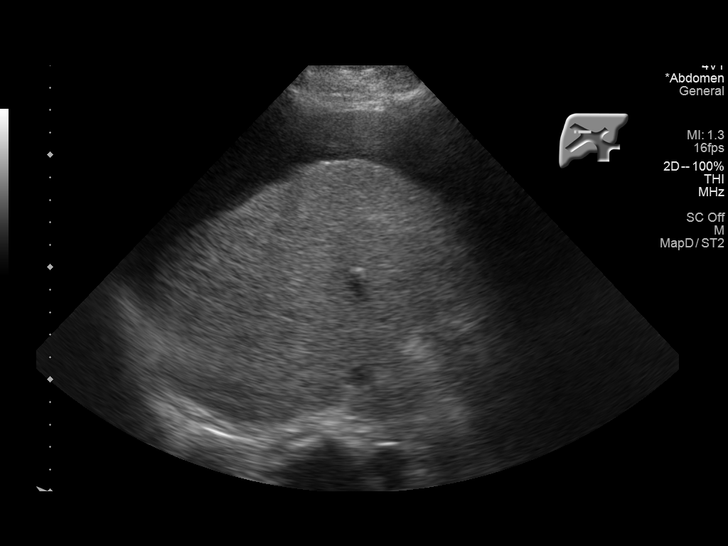
[im 33/36]
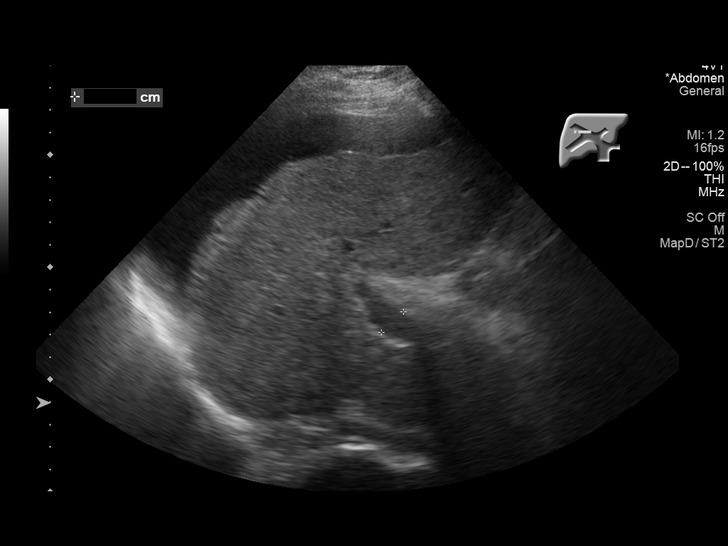
[im 36/36]
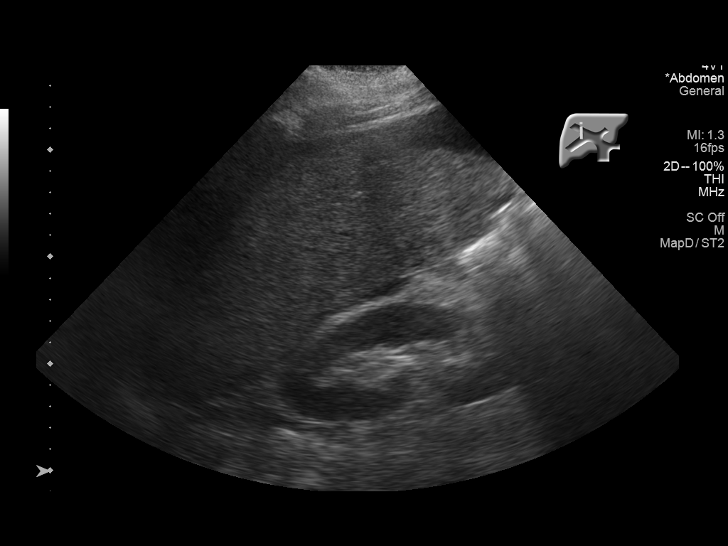

[14 of 25 positions shown; findings below may reference images not displayed]

FINDINGS: Gallbladder:

Surgically absent

Common bile duct:

Diameter: 5 mm. Shadowing echogenicity from pneumobilia by prior CT.
No distinguishable biliary calculus.

Liver:

Nodular liver with heterogeneous echotexture. Simple cysts measuring
28 mm and 9 mm. Portal vein is patent on color Doppler imaging with
normal direction of blood flow towards the liver.

Other: Small volume ascites
IMPRESSION: 1. Pneumobilia within the nondilated biliary tree.  Cholecystectomy.
2. Cirrhosis with small volume ascites.

## 2020-01-04 IMAGING — CT CT ABDOMEN AND PELVIS WITH CONTRAST
2 of 5 series · 16 of 46 positions shown, 18 images · IV contrast (APPLIED)
Comparison: CT abdomen pelvis August 20, 2018

CLINICAL DATA: Abdominal pain, discharged earlier today. History of
liver disease with frequent paracentesis. Now with abdominal and
back pain.

EXAM:
CT ABDOMEN AND PELVIS WITH CONTRAST
TECHNIQUE: Multidetector CT imaging of the abdomen and pelvis was performed
using the standard protocol following bolus administration of
intravenous contrast.
CONTRAST:  100mL OMNIPAQUE IOHEXOL 300 MG/ML  SOLN

[Series 2: routine abd/pel with · axial · 0.98mm/px · z∈[-945,-535]mm · 13 of 94 slices shown, 15 images]
[im 6/94  soft-tissue]
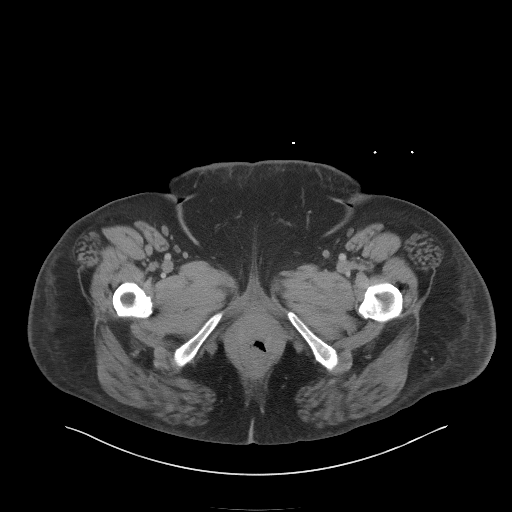
[im 6/94  bone]
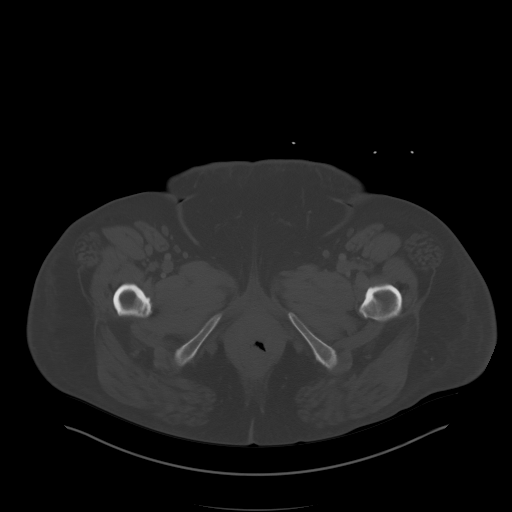
[im 11/94  soft-tissue]
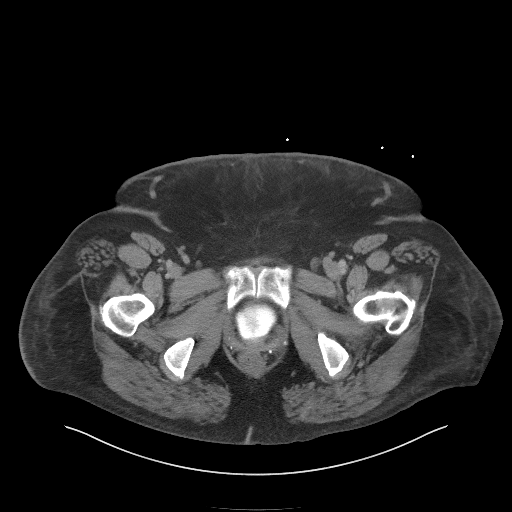
[im 21/94  soft-tissue]
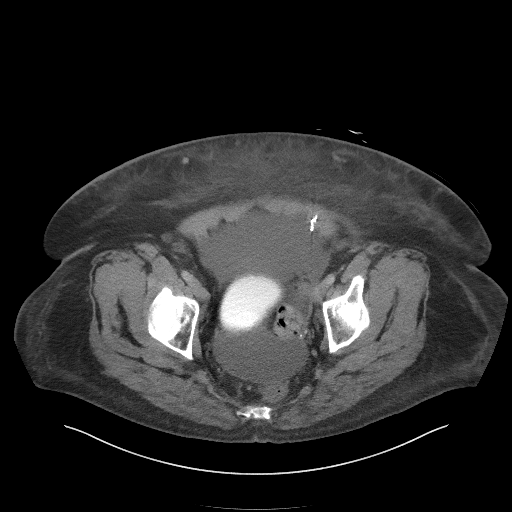
[im 26/94  soft-tissue]
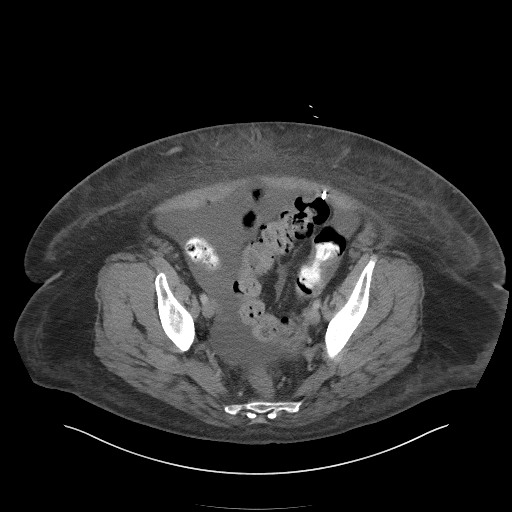
[im 32/94  soft-tissue]
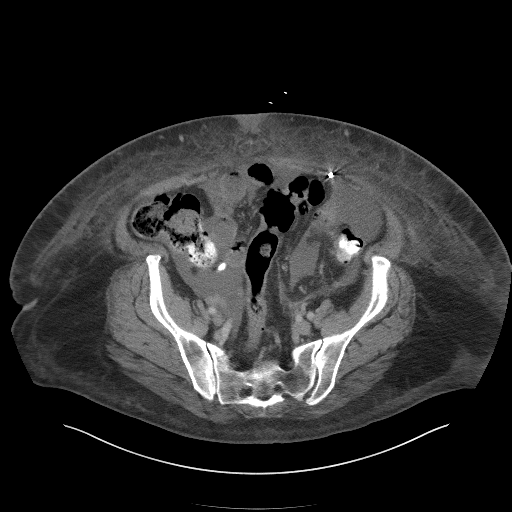
[im 42/94  soft-tissue]
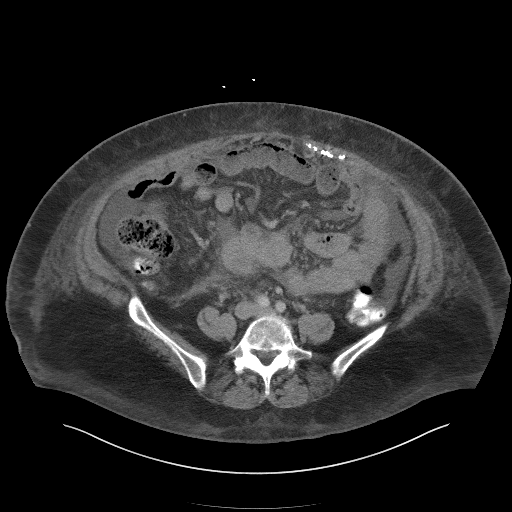
[im 47/94  soft-tissue]
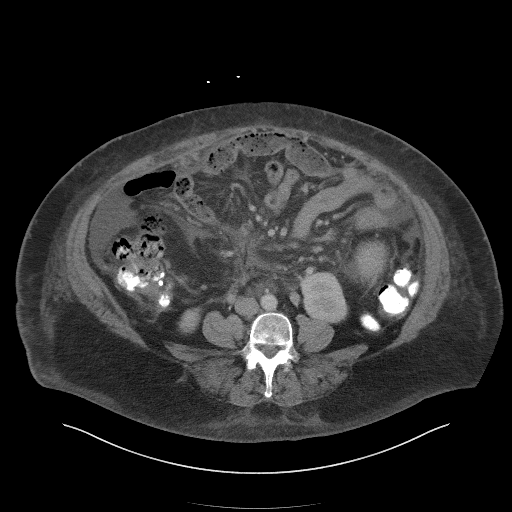
[im 52/94  soft-tissue]
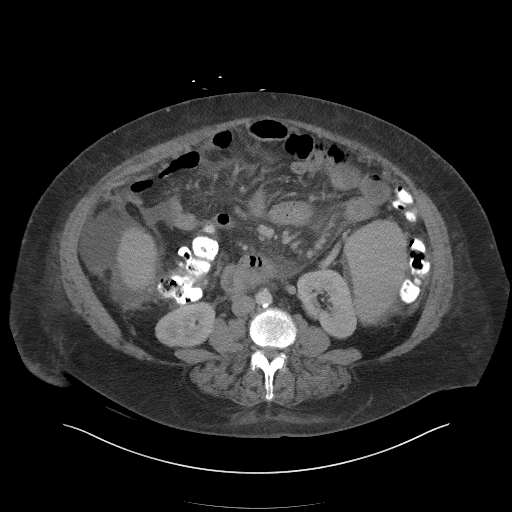
[im 63/94  soft-tissue]
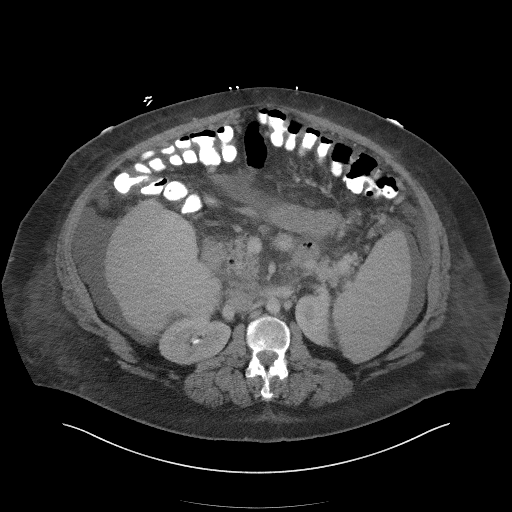
[im 63/94  bone]
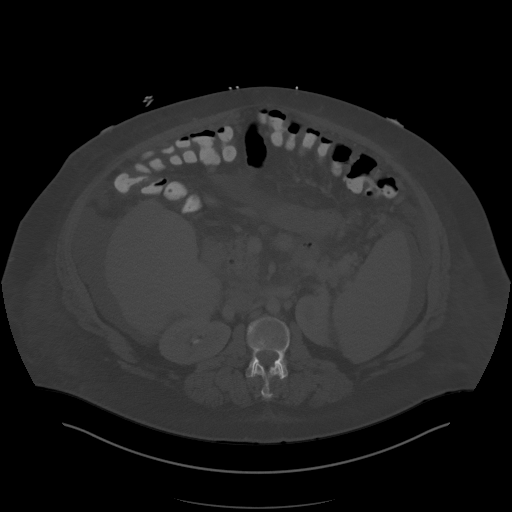
[im 68/94  soft-tissue]
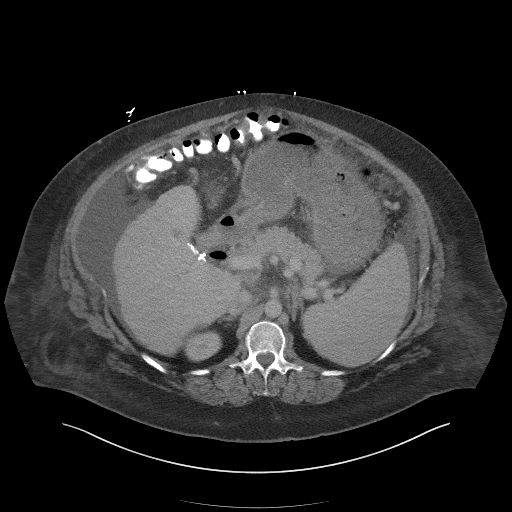
[im 73/94  soft-tissue]
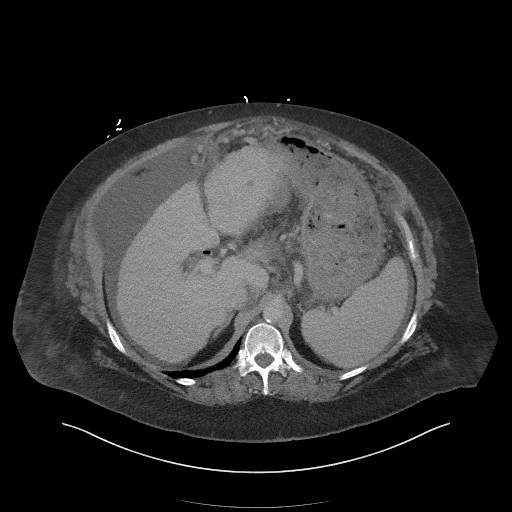
[im 83/94  soft-tissue]
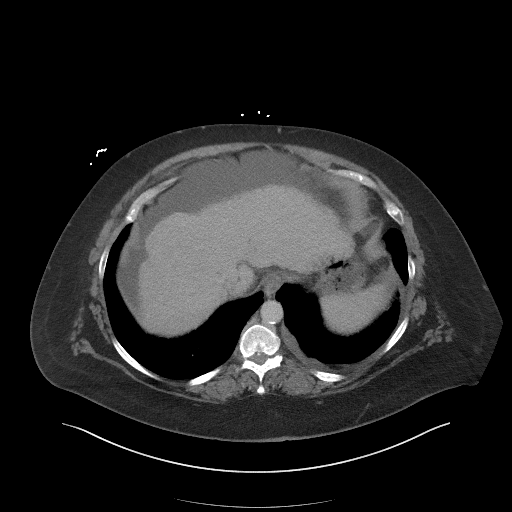
[im 88/94  soft-tissue]
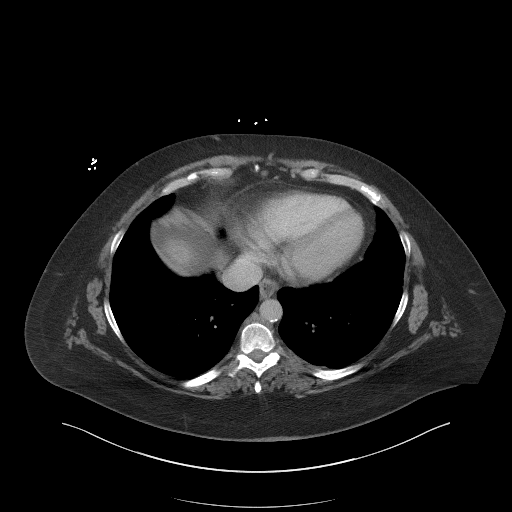

[Series 5: coronal st · coronal · 0.93mm/px · 3 of 115 slices shown]
[im 39/115  soft-tissue]
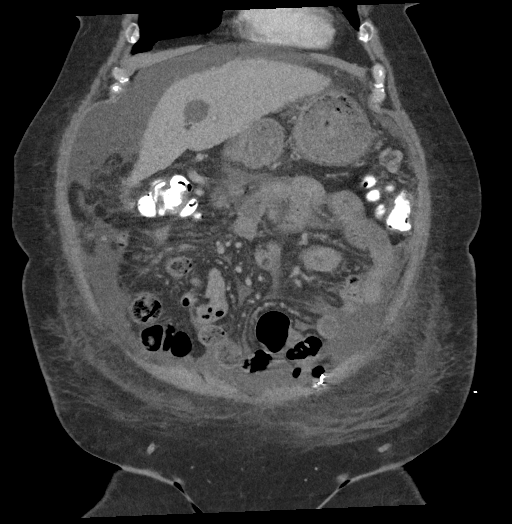
[im 51/115  soft-tissue]
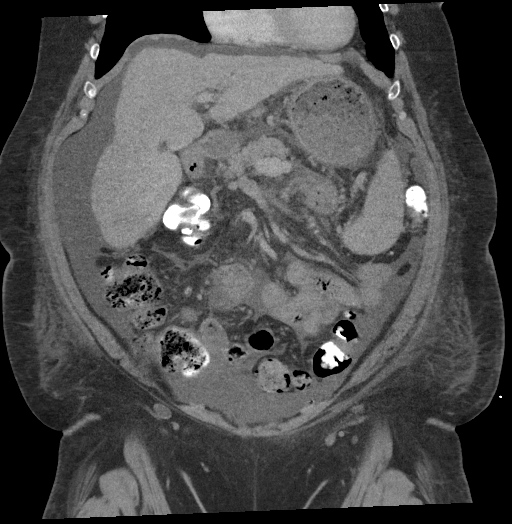
[im 64/115  soft-tissue]
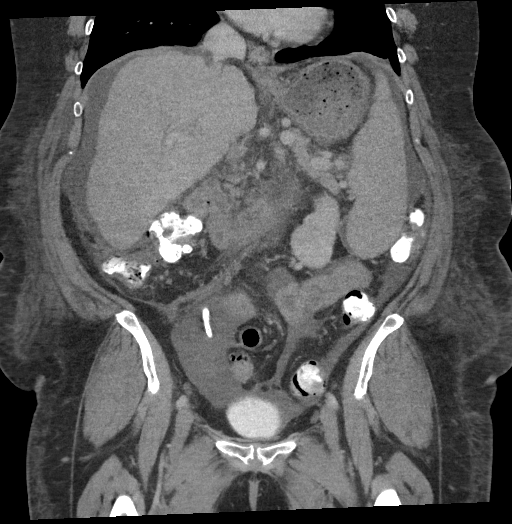

[16 of 46 positions shown; findings below may reference images not displayed]

FINDINGS: Lower chest: Small left pleural effusion with adjacent atelectatic
change. Lung bases are otherwise clear. Normal heart size. No
pericardial effusion. Small pericaval lipoma.

Hepatobiliary: Diffusely nodular hepatic surface contour. Stable
hypoattenuating lesions throughout the liver compatible with hepatic
cysts. Patient is post cholecystectomy. There is pneumobilia within
the common bile duct and left intrahepatic biliary tree, new from 1
day prior.

Pancreas: Unremarkable. No pancreatic ductal dilatation or
surrounding inflammatory changes.

Spleen: Stable splenomegaly. No focal splenic lesions

Adrenals/Urinary Tract: Normal adrenal glands. Some residual
contrast media is seen within the renal collecting system. The
kidneys enhance symmetrically. No focal renal abnormality. No
urolithiasis. Excreted contrast media is retained within the urinary
bladder.

Stomach/Bowel: Diffuse thickening of the duodenal sweep. More distal
small bowel is unremarkable. Enteric contrast medium is retained
within the colon. A normal appendix is visualized.

Vascular/Lymphatic: Atherosclerotic plaque within the normal caliber
aorta. Upper abdominal venous collateralization compatible with
patient history of portal hypertension. No suspicious or enlarged
lymph nodes in the included lymphatic chains.

Reproductive: Uterus is surgically absent. No concerning adnexal
lesions.

Other: Moderate volume ascites. No free intraperitoneal gas.
Circumferential body wall edema and features of anasarca. No bowel
containing hernia.

Musculoskeletal: No acute osseous abnormality or suspicious osseous
lesion. Multilevel degenerative changes are present in the imaged
portions of the spine.
IMPRESSION: 1. Interval development of pneumobilia within the common bile duct
and left intrahepatic biliary tree, may be a normal finding in the
setting prior sphincterotomy or recent ERCP or other cause for an
incompetent sphincter of Oddi. Recommend correlation with patient's
procedural history.
2. Diffuse thickening of the duodenal sweep, nonspecific, and may
reflect features of portal enteropathy though duodenitis could have
a similar appearance.
3. Cirrhosis and portal hypertension as evidenced by splenomegaly
and upper abdominal venous collateralization.
4. Small left pleural effusion with adjacent atelectatic change.
5. Aortic Atherosclerosis ([id number]).

## 2020-04-24 ENCOUNTER — Emergency Department

## 2020-04-24 ENCOUNTER — Inpatient Hospital Stay
Admission: EM | Admit: 2020-04-24 | Discharge: 2020-05-01 | DRG: 871 | Disposition: A | Discharge status: Hospice | Attending: Internal Medicine | Admitting: Internal Medicine

## 2020-04-24 ENCOUNTER — Other Ambulatory Visit: Payer: Self-pay

## 2020-04-24 DIAGNOSIS — R55 Syncope and collapse: Secondary | ICD-10-CM | POA: Diagnosis present

## 2020-04-24 DIAGNOSIS — K7581 Nonalcoholic steatohepatitis (NASH): Secondary | ICD-10-CM | POA: Diagnosis not present

## 2020-04-24 DIAGNOSIS — Z79899 Other long term (current) drug therapy: Secondary | ICD-10-CM

## 2020-04-24 DIAGNOSIS — R188 Other ascites: Secondary | ICD-10-CM | POA: Diagnosis not present

## 2020-04-24 DIAGNOSIS — I10 Essential (primary) hypertension: Secondary | ICD-10-CM | POA: Diagnosis present

## 2020-04-24 DIAGNOSIS — K219 Gastro-esophageal reflux disease without esophagitis: Secondary | ICD-10-CM | POA: Diagnosis present

## 2020-04-24 DIAGNOSIS — W19XXXA Unspecified fall, initial encounter: Secondary | ICD-10-CM | POA: Diagnosis not present

## 2020-04-24 DIAGNOSIS — G928 Other toxic encephalopathy: Secondary | ICD-10-CM | POA: Diagnosis present

## 2020-04-24 DIAGNOSIS — F329 Major depressive disorder, single episode, unspecified: Secondary | ICD-10-CM | POA: Diagnosis present

## 2020-04-24 DIAGNOSIS — Z66 Do not resuscitate: Secondary | ICD-10-CM | POA: Diagnosis present

## 2020-04-24 DIAGNOSIS — Z8673 Personal history of transient ischemic attack (TIA), and cerebral infarction without residual deficits: Secondary | ICD-10-CM

## 2020-04-24 DIAGNOSIS — Z515 Encounter for palliative care: Secondary | ICD-10-CM | POA: Diagnosis not present

## 2020-04-24 DIAGNOSIS — Q780 Osteogenesis imperfecta: Secondary | ICD-10-CM

## 2020-04-24 DIAGNOSIS — K652 Spontaneous bacterial peritonitis: Secondary | ICD-10-CM | POA: Diagnosis present

## 2020-04-24 DIAGNOSIS — J449 Chronic obstructive pulmonary disease, unspecified: Secondary | ICD-10-CM | POA: Diagnosis present

## 2020-04-24 DIAGNOSIS — E876 Hypokalemia: Secondary | ICD-10-CM | POA: Diagnosis present

## 2020-04-24 DIAGNOSIS — Z8542 Personal history of malignant neoplasm of other parts of uterus: Secondary | ICD-10-CM

## 2020-04-24 DIAGNOSIS — A419 Sepsis, unspecified organism: Principal | ICD-10-CM | POA: Diagnosis present

## 2020-04-24 DIAGNOSIS — Z8249 Family history of ischemic heart disease and other diseases of the circulatory system: Secondary | ICD-10-CM

## 2020-04-24 DIAGNOSIS — E441 Mild protein-calorie malnutrition: Secondary | ICD-10-CM | POA: Diagnosis present

## 2020-04-24 DIAGNOSIS — K766 Portal hypertension: Secondary | ICD-10-CM | POA: Diagnosis present

## 2020-04-24 DIAGNOSIS — Z6841 Body Mass Index (BMI) 40.0 and over, adult: Secondary | ICD-10-CM

## 2020-04-24 DIAGNOSIS — K7460 Unspecified cirrhosis of liver: Secondary | ICD-10-CM | POA: Diagnosis not present

## 2020-04-24 DIAGNOSIS — E119 Type 2 diabetes mellitus without complications: Secondary | ICD-10-CM

## 2020-04-24 DIAGNOSIS — G894 Chronic pain syndrome: Secondary | ICD-10-CM | POA: Diagnosis not present

## 2020-04-24 DIAGNOSIS — Z20822 Contact with and (suspected) exposure to covid-19: Secondary | ICD-10-CM | POA: Diagnosis present

## 2020-04-24 DIAGNOSIS — G4733 Obstructive sleep apnea (adult) (pediatric): Secondary | ICD-10-CM | POA: Diagnosis present

## 2020-04-24 DIAGNOSIS — E8809 Other disorders of plasma-protein metabolism, not elsewhere classified: Secondary | ICD-10-CM | POA: Diagnosis present

## 2020-04-24 DIAGNOSIS — R6521 Severe sepsis with septic shock: Secondary | ICD-10-CM | POA: Diagnosis present

## 2020-04-24 DIAGNOSIS — E871 Hypo-osmolality and hyponatremia: Secondary | ICD-10-CM | POA: Diagnosis present

## 2020-04-24 DIAGNOSIS — M818 Other osteoporosis without current pathological fracture: Secondary | ICD-10-CM | POA: Diagnosis present

## 2020-04-24 DIAGNOSIS — L409 Psoriasis, unspecified: Secondary | ICD-10-CM | POA: Diagnosis present

## 2020-04-24 DIAGNOSIS — I959 Hypotension, unspecified: Secondary | ICD-10-CM

## 2020-04-24 DIAGNOSIS — F4310 Post-traumatic stress disorder, unspecified: Secondary | ICD-10-CM | POA: Diagnosis present

## 2020-04-24 DIAGNOSIS — R6520 Severe sepsis without septic shock: Principal | ICD-10-CM

## 2020-04-24 DIAGNOSIS — Z7189 Other specified counseling: Secondary | ICD-10-CM | POA: Diagnosis not present

## 2020-04-24 DIAGNOSIS — Z888 Allergy status to other drugs, medicaments and biological substances status: Secondary | ICD-10-CM

## 2020-04-24 DIAGNOSIS — Z794 Long term (current) use of insulin: Secondary | ICD-10-CM

## 2020-04-24 DIAGNOSIS — I8500 Esophageal varices without bleeding: Secondary | ICD-10-CM | POA: Diagnosis present

## 2020-04-24 DIAGNOSIS — K7290 Hepatic failure, unspecified without coma: Secondary | ICD-10-CM | POA: Diagnosis present

## 2020-04-24 DIAGNOSIS — F419 Anxiety disorder, unspecified: Secondary | ICD-10-CM | POA: Diagnosis present

## 2020-04-24 DIAGNOSIS — Z886 Allergy status to analgesic agent status: Secondary | ICD-10-CM

## 2020-04-24 DIAGNOSIS — Z885 Allergy status to narcotic agent status: Secondary | ICD-10-CM

## 2020-04-24 DIAGNOSIS — E039 Hypothyroidism, unspecified: Secondary | ICD-10-CM | POA: Diagnosis present

## 2020-04-24 DIAGNOSIS — Z79891 Long term (current) use of opiate analgesic: Secondary | ICD-10-CM

## 2020-04-24 DIAGNOSIS — E875 Hyperkalemia: Secondary | ICD-10-CM | POA: Diagnosis not present

## 2020-04-24 DIAGNOSIS — N179 Acute kidney failure, unspecified: Secondary | ICD-10-CM | POA: Diagnosis present

## 2020-04-24 DIAGNOSIS — Z9049 Acquired absence of other specified parts of digestive tract: Secondary | ICD-10-CM

## 2020-04-24 DIAGNOSIS — Z90710 Acquired absence of both cervix and uterus: Secondary | ICD-10-CM

## 2020-04-24 DIAGNOSIS — T1490XA Injury, unspecified, initial encounter: Secondary | ICD-10-CM

## 2020-04-24 DIAGNOSIS — K7031 Alcoholic cirrhosis of liver with ascites: Secondary | ICD-10-CM | POA: Diagnosis not present

## 2020-04-24 LAB — CBC
HCT: 28.8 % — ABNORMAL LOW (ref 36.0–46.0)
Hemoglobin: 9.3 g/dL — ABNORMAL LOW (ref 12.0–15.0)
MCH: 26.4 pg (ref 26.0–34.0)
MCHC: 32.3 g/dL (ref 30.0–36.0)
MCV: 81.8 fL (ref 80.0–100.0)
Platelets: 116 10*3/uL — ABNORMAL LOW (ref 150–400)
RBC: 3.52 MIL/uL — ABNORMAL LOW (ref 3.87–5.11)
RDW: 19.9 % — ABNORMAL HIGH (ref 11.5–15.5)
WBC: 6 10*3/uL (ref 4.0–10.5)
nRBC: 0 % (ref 0.0–0.2)

## 2020-04-24 LAB — COMPREHENSIVE METABOLIC PANEL
ALT: 27 U/L (ref 0–44)
AST: 40 U/L (ref 15–41)
Albumin: 2.5 g/dL — ABNORMAL LOW (ref 3.5–5.0)
Alkaline Phosphatase: 96 U/L (ref 38–126)
Anion gap: 5 (ref 5–15)
BUN: 46 mg/dL — ABNORMAL HIGH (ref 6–20)
CO2: 20 mmol/L — ABNORMAL LOW (ref 22–32)
Calcium: 8.4 mg/dL — ABNORMAL LOW (ref 8.9–10.3)
Chloride: 97 mmol/L — ABNORMAL LOW (ref 98–111)
Creatinine, Ser: 1.89 mg/dL — ABNORMAL HIGH (ref 0.44–1.00)
GFR, Estimated: 31 mL/min — ABNORMAL LOW (ref >60)
Glucose, Bld: 100 mg/dL — ABNORMAL HIGH (ref 70–99)
Potassium: 6.1 mmol/L — ABNORMAL HIGH (ref 3.5–5.1)
Sodium: 122 mmol/L — ABNORMAL LOW (ref 135–145)
Total Bilirubin: 1.2 mg/dL (ref 0.3–1.2)
Total Protein: 4.8 g/dL — ABNORMAL LOW (ref 6.5–8.1)

## 2020-04-24 LAB — BRAIN NATRIURETIC PEPTIDE: B Natriuretic Peptide: 9.2 pg/mL (ref 0.0–100.0)

## 2020-04-24 LAB — PROTIME-INR
INR: 1.3 — ABNORMAL HIGH (ref 0.8–1.2)
Prothrombin Time: 15.9 seconds — ABNORMAL HIGH (ref 11.4–15.2)

## 2020-04-24 LAB — TROPONIN I (HIGH SENSITIVITY): Troponin I (High Sensitivity): 8 ng/L (ref <18)

## 2020-04-24 LAB — APTT: aPTT: 31 seconds (ref 24–36)

## 2020-04-24 LAB — MAGNESIUM: Magnesium: 2.3 mg/dL (ref 1.7–2.4)

## 2020-04-24 LAB — AMMONIA: Ammonia: 37 umol/L — ABNORMAL HIGH (ref 9–35)

## 2020-04-24 LAB — PHOSPHORUS: Phosphorus: 4.1 mg/dL (ref 2.5–4.6)

## 2020-04-24 LAB — LACTIC ACID, PLASMA
Lactic Acid, Venous: 1.4 mmol/L (ref 0.5–1.9)
Lactic Acid, Venous: 1.8 mmol/L (ref 0.5–1.9)

## 2020-04-24 LAB — RESP PANEL BY RT-PCR (FLU A&B, COVID) ARPGX2
Influenza A by PCR: NEGATIVE
Influenza B by PCR: NEGATIVE
SARS Coronavirus 2 by RT PCR: NEGATIVE

## 2020-04-24 LAB — CBG MONITORING, ED: Glucose-Capillary: 123 mg/dL — ABNORMAL HIGH (ref 70–99)

## 2020-04-24 LAB — SODIUM: Sodium: 121 mmol/L — ABNORMAL LOW (ref 135–145)

## 2020-04-24 MED ORDER — METRONIDAZOLE IN NACL 5-0.79 MG/ML-% IV SOLN
500.0000 mg | Freq: Once | INTRAVENOUS | Status: AC
  Administered 2020-04-24: 500 mg via INTRAVENOUS
  Filled 2020-04-24: qty 100

## 2020-04-24 MED ORDER — CALCIUM GLUCONATE-NACL 1-0.675 GM/50ML-% IV SOLN
1.0000 g | Freq: Once | INTRAVENOUS | Status: AC
  Administered 2020-04-24: 1000 mg via INTRAVENOUS
  Filled 2020-04-24: qty 50

## 2020-04-24 MED ORDER — PANTOPRAZOLE SODIUM 40 MG IV SOLR
40.0000 mg | Freq: Every day | INTRAVENOUS | Status: DC
  Administered 2020-04-24 – 2020-04-30 (×7): 40 mg via INTRAVENOUS
  Filled 2020-04-24 (×7): qty 40

## 2020-04-24 MED ORDER — POLYETHYLENE GLYCOL 3350 17 G PO PACK: 17.0000 g | PACK | Freq: Every day | ORAL | Status: DC | PRN

## 2020-04-24 MED ORDER — SODIUM CHLORIDE 0.9 % IV BOLUS
1000.0000 mL | Freq: Once | INTRAVENOUS | Status: AC
  Administered 2020-04-24: 1000 mL via INTRAVENOUS

## 2020-04-24 MED ORDER — SODIUM CHLORIDE 0.9 % IV SOLN
1000.0000 mL | Freq: Once | INTRAVENOUS | Status: AC
  Administered 2020-04-24: 1000 mL via INTRAVENOUS

## 2020-04-24 MED ORDER — INSULIN ASPART 100 UNIT/ML ~~LOC~~ SOLN
5.0000 [IU] | Freq: Once | SUBCUTANEOUS | Status: AC
  Administered 2020-04-24: 5 [IU] via INTRAVENOUS
  Filled 2020-04-24: qty 1

## 2020-04-24 MED ORDER — LORAZEPAM 2 MG/ML IJ SOLN
0.5000 mg | Freq: Four times a day (QID) | INTRAMUSCULAR | Status: DC | PRN
  Administered 2020-04-25: 0.5 mg via INTRAVENOUS
  Filled 2020-04-24: qty 1

## 2020-04-24 MED ORDER — FENTANYL 25 MCG/HR TD PT72
1.0000 | MEDICATED_PATCH | TRANSDERMAL | Status: DC
Start: 2020-04-24 — End: 2020-04-26
  Administered 2020-04-25: 1 via TRANSDERMAL
  Filled 2020-04-24: qty 1

## 2020-04-24 MED ORDER — DOCUSATE SODIUM 100 MG PO CAPS: 100.0000 mg | ORAL_CAPSULE | Freq: Two times a day (BID) | ORAL | Status: DC | PRN

## 2020-04-24 MED ORDER — NOREPINEPHRINE 4 MG/250ML-% IV SOLN
0.0000 ug/min | INTRAVENOUS | Status: DC
  Administered 2020-04-24 – 2020-04-25 (×2): 10 ug/min via INTRAVENOUS
  Administered 2020-04-25: 7 ug/min via INTRAVENOUS
  Administered 2020-04-25 – 2020-04-26 (×2): 10 ug/min via INTRAVENOUS
  Administered 2020-04-27: 4 ug/min via INTRAVENOUS
  Filled 2020-04-24 (×7): qty 250

## 2020-04-24 MED ORDER — ENOXAPARIN SODIUM 40 MG/0.4ML ~~LOC~~ SOLN
40.0000 mg | SUBCUTANEOUS | Status: DC
  Administered 2020-04-24: 40 mg via SUBCUTANEOUS
  Filled 2020-04-24: qty 0.4

## 2020-04-24 MED ORDER — VANCOMYCIN HCL IN DEXTROSE 1-5 GM/200ML-% IV SOLN
1000.0000 mg | Freq: Once | INTRAVENOUS | Status: AC
  Administered 2020-04-24: 1000 mg via INTRAVENOUS
  Filled 2020-04-24: qty 200

## 2020-04-24 MED ORDER — ALBUMIN HUMAN 5 % IV SOLN
25.0000 g | Freq: Once | INTRAVENOUS | Status: AC
  Administered 2020-04-24: 12.5 g via INTRAVENOUS
  Filled 2020-04-24: qty 500

## 2020-04-24 MED ORDER — IPRATROPIUM-ALBUTEROL 0.5-2.5 (3) MG/3ML IN SOLN: 3.0000 mL | Freq: Four times a day (QID) | RESPIRATORY_TRACT | Status: DC | PRN

## 2020-04-24 MED ORDER — DEXTROSE 50 % IV SOLN
1.0000 | Freq: Once | INTRAVENOUS | Status: AC
  Administered 2020-04-24: 50 mL via INTRAVENOUS
  Filled 2020-04-24: qty 50

## 2020-04-24 MED ORDER — SODIUM CHLORIDE 0.9 % IV SOLN
2.0000 g | Freq: Once | INTRAVENOUS | Status: AC
  Administered 2020-04-24: 2 g via INTRAVENOUS
  Filled 2020-04-24: qty 2

## 2020-04-24 MED ORDER — SODIUM CHLORIDE 0.9 % IV SOLN
2.0000 g | INTRAVENOUS | Status: AC
  Administered 2020-04-24 – 2020-04-30 (×7): 2 g via INTRAVENOUS
  Filled 2020-04-24: qty 20
  Filled 2020-04-24: qty 2
  Filled 2020-04-24 (×6): qty 20

## 2020-04-24 NOTE — ED Notes (Signed)
Calcium gluconate infusing in R AC line and flagyl switched to R hand per protocol not to administer calcium in the hand.

## 2020-04-24 NOTE — ED Notes (Signed)
Hospitalist at bedside 

## 2020-04-24 NOTE — ED Provider Notes (Signed)
[facility] Emergency Department Provider Note  ____________________________________________   Event Date/Time   First MD Initiated Contact with Patient 04/24/20 1823     (approximate)  I have reviewed the triage vital signs and the nursing notes.   HISTORY  Chief Complaint Fall    HPI Lori Mckenzie is a 55 y.o. female with extensive past medical history including severe psoriasis, currently on hospice, here with general fatigue.  The patient reportedly was  only just released from a nursing home after recent hospital admission.  The patient was feeling somewhat weak earlier today but wanted to go shopping.  She reportedly was walking around shopping, when she was noted to seemingly leaning forward, falling.  She was very confused.  She was very weak.  She has been unable to get up since then.  She complains of right facial and chest pain as well as diffuse abdominal pain.  She recently was admitted for her cirrhosis and had a paracentesis.  She has had some mild abdominal pain.  Does not recall any fevers.  Patient very disoriented on arrival, intermittently falling asleep.  Remainder of history limited due to this confusion.  Level 5 caveat invoked as remainder of history, ROS, and physical exam limited due to patient's confusion.         Past Medical History:  Diagnosis Date  . Abdominal abscess 08/25/2014  . Acid reflux 08/10/2010  . Acute cervical myofascial strain 02/09/2016  . Acute postoperative pain 08/09/2016  . Anxiety   . Ascites   . Asthma   . Back pain   . Bile leak, postoperative 07/17/2014  . Brittle bone disease   . Cancer ([place])    Uteriine  ca 48yr ago partial hysterectomy  . Cervical disc disease   . Chronic kidney disease   . Collagen vascular disease ([place]    RA  3-4 yrs ago  . COPD (chronic obstructive pulmonary disease) ([place]   . Diabetes mellitus without complication ([place]   . GERD (gastroesophageal reflux disease)   .  Hypertension   . Hypothyroidism   . Left upper quadrant pain 01/09/2014  . Major depressive disorder with single episode 12/05/2011  . Major depressive disorder, single episode 12/05/2011  . Migraines   . NASH (nonalcoholic steatohepatitis)   . Respiratory infection    2/17  . Shock ([place] 09/18/2014  . Sleep apnea   . Sleep apnea   . Syncope 11/16/2014  . Thyroid disease   . TIA (transient ischemic attack)     Patient Active Problem List   Diagnosis Date Noted  . Syncope and collapse 04/24/2020  . UTI (urinary tract infection) 06/11/2017  . Neurogenic pain 02/13/2017  . Chest pain 12/20/2016  . Chronic feet pain (Secondary source of pain) (Bilateral) (R>L) 10/26/2016  . Acute hepatic encephalopathy 09/23/2016  . Low back pain due to L1-2 disc extrusion (caudal) (Left) 07/11/2016  . Abdominal pain 06/28/2016  . Constipation 06/20/2016  . Hyperglycemia 06/20/2016  . Nausea without vomiting 06/06/2016  . Chronic lower extremity pain (Bilateral) 05/19/2016  . Spinal stenosis, thoracic region (T10-11) 05/19/2016  . Diabetes mellitus, insulin dependent (IDDM), uncontrolled 04/12/2016  . Chronic sacroiliac joint pain (Bilateral) (L>R) 03/23/2016  . Chronic upper extremity pain (Left) 03/10/2016  . Chronic radicular cervical pain (L) 03/10/2016  . Osteoarthritis 02/24/2016  . Allodynia 02/09/2016  . Chronic pain syndrome 01/07/2016  . Opiate withdrawal ([place] 12/22/2015  . Elevated liver enzymes 09/28/2015  . Cirrhosis of liver with ascites ([place]  09/24/2015  . Cirrhosis ([place]) 09/24/2015  . Altered mental status 07/24/2015  . Uncontrolled diabetes mellitus ([place]) 07/24/2015  . Radicular pain of thoracic region 04/06/2015  . Hepatic encephalopathy ([place]) 03/11/2015  . Elevated sedimentation rate 03/05/2015  . Elevated C-reactive protein (CRP) 03/05/2015  . Lumbar facet syndrome (Bilateral) (R>L) 03/05/2015  . Cervical spondylosis 03/05/2015  . Lumbar spondylosis 03/05/2015  .  Encounter for chronic pain management 03/05/2015  . Chronic shoulder pain (Bilateral) (R>L) 03/05/2015  . Chronic carpal tunnel syndrome (Bilateral) 03/05/2015  . Chronic hip pain (Bilateral) (L>R) 03/05/2015  . Chronic upper back pain (Bilateral) (L>R) 03/05/2015  . Osteoporosis, idiopathic 03/05/2015  . Abnormal MRI, lumbar spine (02/03/2015) 03/05/2015  . Thoracic radiculitis 02/05/2015  . Vitamin D deficiency 01/13/2015  . B12 deficiency 01/13/2015  . Folate deficiency 01/13/2015  . Subacute lumbar radiculopathy (left side) (S1 dermatome) 12/10/2014  . Drowsiness 11/16/2014  . Episode of syncope 11/16/2014  . Somnolence 11/16/2014  . Opiate use (75 MME/Day) 10/28/2014  . Long term prescription opiate use 10/28/2014  . Long term current use of opiate analgesic 10/28/2014  . Encounter for therapeutic drug level monitoring 10/28/2014  . Chronic epigastric abdominal pain 10/28/2014  . Chronic low back pain (Primary Source of Pain) (Bilateral) (R>L) 10/28/2014  . Chronic neck pain [facility] source of pain) (Bilateral) (R>L) 10/28/2014  . Ascites 09/05/2014  . NASH (nonalcoholic steatohepatitis) 09/05/2014  . Hypokalemia 09/05/2014  . Dysthymia 08/05/2014  . Other social stressor 08/05/2014  . Steatohepatitis 07/15/2014  . Type 2 diabetes mellitus ([place]) 07/12/2014  . COPD with acute exacerbation ([place]) 07/12/2014  . GERD (gastroesophageal reflux disease) 07/12/2014  . OSA on CPAP 07/12/2014  . Anxiety 07/12/2014  . 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 Lumbar and sacral osteoarthritis 03/26/2014  . Myofascial pain 03/26/2014  . Lumbar spinal stenosis 03/26/2014  . Lumbosacral spondylosis without myelopathy 03/26/2014  . 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 Lumbar central spinal stenosis (L1-2 and L4-5) 03/26/2014  . Spondylosis of lumbar region without myelopathy or radiculopathy 03/26/2014  . Breath shortness 01/09/2014  . Diastolic dysfunction with chronic  heart failure ([place]) 08/06/2013  . Airway hyperreactivity 08/06/2013  . Essential (primary) hypertension 08/06/2013  . Asthma 08/06/2013  . Clinical depression 12/05/2011    Past Surgical History:  Procedure Laterality Date  . ABDOMINAL HYSTERECTOMY    . CHOLECYSTECTOMY N/A 07/15/2014   Procedure: LAPAROSCOPIC CHOLECYSTECTOMY with liver biopsy ;  Surgeon: Sherri Rad, MD;  Location: [facility] ORS;  Service: General;  Laterality: N/A;  . COLONOSCOPY WITH PROPOFOL N/A 06/23/2014   Procedure: COLONOSCOPY WITH PROPOFOL;  Surgeon: Lollie Sails, MD;  Location: [facility] ENDOSCOPY;  Service: Endoscopy;  Laterality: N/A;  . ERCP N/A 07/16/2014   Procedure: ENDOSCOPIC RETROGRADE CHOLANGIOPANCREATOGRAPHY (ERCP);  Surgeon: Clarene Essex, MD;  Location: [facility] ENDOSCOPY;  Service: Endoscopy;  Laterality: N/A;  . ERCP N/A 10/03/2014   Procedure: ENDOSCOPIC RETROGRADE CHOLANGIOPANCREATOGRAPHY (ERCP);  Surgeon: Hulen Luster, MD;  Location: [facility] ENDOSCOPY;  Service: Gastroenterology;  Laterality: N/A;  . ESOPHAGOGASTRODUODENOSCOPY N/A 06/23/2014   Procedure: ESOPHAGOGASTRODUODENOSCOPY (EGD);  Surgeon: Lollie Sails, MD;  Location: [facility] ENDOSCOPY;  Service: Endoscopy;  Laterality: N/A;  . ESOPHAGOGASTRODUODENOSCOPY N/A 06/30/2016   Procedure: ESOPHAGOGASTRODUODENOSCOPY (EGD);  Surgeon: Jonathon Bellows, MD;  Location: [facility] ENDOSCOPY;  Service: Endoscopy;  Laterality: N/A;  . ESOPHAGOGASTRODUODENOSCOPY (EGD) WITH PROPOFOL N/A 12/01/2015   Procedure: ESOPHAGOGASTRODUODENOSCOPY (EGD) WITH PROPOFOL;  Surgeon: Lollie Sails, MD;  Location: [facility] ENDOSCOPY;  Service: Endoscopy;  Laterality: N/A;  .  TUBAL LIGATION    . WISDOM TOOTH EXTRACTION      Prior to Admission medications   Medication Sig Start Date End Date Taking? Authorizing Provider  albuterol (PROVENTIL HFA;VENTOLIN HFA) 108 (90 Base) MCG/ACT inhaler Inhale 2 puffs into the lungs every 6 (six) hours as needed for wheezing or shortness of breath.    [provider]  ALPRAZolam Duanne Moron) 1 MG tablet Take 1 mg by mouth 3 (three) times daily as needed.     [provider]  cholecalciferol (VITAMIN D) 1000 units tablet Take 2,000 Units by mouth daily.    [provider]  citalopram (CELEXA) 40 MG tablet Take 40 mg by mouth daily.  07/24/15   [provider]  diclofenac sodium (VOLTAREN) 1 % GEL Apply 4 g topically every 8 (eight) hours as needed. Patient taking differently: Apply 4 g topically 2 (two) times daily.  12/06/16 08/20/18  Milinda Pointer, MD  dicyclomine (BENTYL) 10 MG capsule Take 10 mg by mouth 2 (two) times daily. 07/23/18   [provider]  fluticasone (FLONASE) 50 MCG/ACT nasal spray Place 1-2 sprays into both nostrils daily as needed for rhinitis.     [provider]  Folate-B12-Intrinsic Factor (INTRINSI B12-FOLATE) 850-277-41 MCG-MCG-MG TABS Take 1 tablet by mouth daily.     [provider]  gabapentin (NEURONTIN) 300 MG capsule Take 900 mg by mouth 3 (three) times daily.     [provider]  insulin regular human CONCENTRATED (HUMULIN R) 500 UNIT/ML injection Inject 100-200 Units into the skin 2 (two) times a day. Inject 200 units in the morning, inject 100 units nightly. Based on glucose readings    [provider]  lactulose (CHRONULAC) 10 GM/15ML solution Take 30 mLs (20 g total) by mouth 3 (three) times daily. Patient taking differently: Take 40 g by mouth 4 (four) times daily.  10/26/16   Nicholes Mango, MD  magnesium oxide (MAG-OX) 400 MG tablet Take 400 mg by mouth daily.    [provider]  nystatin (MYCOSTATIN/NYSTOP) powder Apply 1 g topically 3 (three) times daily as needed. For irritation 10/26/16   Gouru, Illene Silver, MD  Oxycodone HCl 10 MG TABS Take 10 mg by mouth 3 (three) times daily.     [provider]  pantoprazole (PROTONIX) 40 MG tablet Take 40 mg by mouth 2 (two) times daily before a meal.    [provider]  potassium chloride SA  (K-DUR,KLOR-CON) 20 MEQ tablet Take 20 mEq by mouth daily as needed. For high blood pressure    [provider]  promethazine (PHENERGAN) 12.5 MG tablet Take 2 tablets (25 mg total) by mouth every 6 (six) hours as needed for nausea or vomiting. Patient taking differently: Take 12.5-25 mg by mouth every 6 (six) hours as needed for nausea or vomiting.  09/25/16   Dustin Flock, MD  rifaximin (XIFAXAN) 550 MG TABS tablet Take 550 mg by mouth daily.     [provider]  spironolactone (ALDACTONE) 100 MG tablet Take 400 mg by mouth daily.     [provider]  vitamin B-12 (CYANOCOBALAMIN) 1000 MCG tablet Take 2,000 mcg by mouth daily.    [provider]  SUMAtriptan (IMITREX) 50 MG tablet Take 50 mg by mouth every 2 (two) hours as needed. For migraines  03/17/11  [provider]    Allergies Aspirin, Nsaids, Tape, Tylenol [acetaminophen], and Vicodin [hydrocodone-acetaminophen]  Family History  Problem Relation Age of Onset  . Lung cancer Mother   .  Ulcers Father   . Emphysema Father   . Heart disease Sister   . Ulcers Sister   . Heart disease Brother     Social History Social History   Tobacco Use  . Smoking status: Never Smoker  . Smokeless tobacco: Never Used  Vaping Use  . Vaping Use: Never used  Substance Use Topics  . Alcohol use: No    Comment: occ  . Drug use: No    Review of Systems  Review of Systems  Unable to perform ROS: Mental status change  Constitutional: Positive for fatigue. Negative for fever.  HENT: Negative for congestion and sore throat.   Eyes: Negative for visual disturbance.  Respiratory: Negative for cough and shortness of breath.   Cardiovascular: Negative for chest pain.  Gastrointestinal: Negative for abdominal pain, diarrhea, nausea and vomiting.  Genitourinary: Negative for flank pain.  Musculoskeletal: Negative for back pain and neck pain.  Skin: Negative for rash and wound.  Neurological: Positive  for weakness.  Psychiatric/Behavioral: Positive for confusion.     ____________________________________________  PHYSICAL EXAM:      VITAL SIGNS: ED Triage Vitals  Enc Vitals Group     BP 04/24/20 1800 (!) 81/28     Pulse Rate 04/24/20 1800 88     Resp 04/24/20 1800 16     Temp 04/24/20 1800 98.2 F (36.8 C)     Temp Source 04/24/20 1800 Oral     SpO2 04/24/20 1800 100 %     Weight 04/24/20 1801 185 lb (83.9 kg)     Height 04/24/20 1801 5' 8"  (1.727 m)     Head Circumference --      Peak Flow --      Pain Score --      Pain Loc --      Pain Edu? --      Excl. in [place]? --      Physical Exam Vitals and nursing note reviewed.  Constitutional:      General: She is not in acute distress.    Appearance: She is well-developed. She is ill-appearing and toxic-appearing.  HENT:     Head: Normocephalic and atraumatic.     Mouth/Throat:     Mouth: Mucous membranes are dry.  Eyes:     Conjunctiva/sclera: Conjunctivae normal.  Cardiovascular:     Rate and Rhythm: Normal rate and regular rhythm.     Heart sounds: Normal heart sounds. No murmur heard. No friction rub.  Pulmonary:     Effort: Pulmonary effort is normal. No respiratory distress.     Breath sounds: Normal breath sounds. No wheezing or rales.  Abdominal:     General: There is no distension.     Palpations: Abdomen is soft.     Tenderness: There is generalized abdominal tenderness. There is no guarding.  Musculoskeletal:     Cervical back: Neck supple.  Skin:    General: Skin is warm.     Capillary Refill: Capillary refill takes less than 2 seconds.  Neurological:     Mental Status: She is alert. She is disoriented and confused.     Motor: No abnormal muscle tone.       ____________________________________________   LABS (all labs ordered are listed, but only abnormal results are displayed)  Labs Reviewed  CBC - Abnormal; Notable for the following components:      Result Value   RBC 3.52 (*)     Hemoglobin 9.3 (*)    HCT 28.8 (*)  RDW 19.9 (*)    Platelets 116 (*)    All other components within normal limits  COMPREHENSIVE METABOLIC PANEL - Abnormal; Notable for the following components:   Sodium 122 (*)    Potassium 6.1 (*)    Chloride 97 (*)    CO2 20 (*)    Glucose, Bld 100 (*)    BUN 46 (*)    Creatinine, Ser 1.89 (*)    Calcium 8.4 (*)    Total Protein 4.8 (*)    Albumin 2.5 (*)    GFR, Estimated 31 (*)    All other components within normal limits  PROTIME-INR - Abnormal; Notable for the following components:   Prothrombin Time 15.9 (*)    INR 1.3 (*)    All other components within normal limits  CBG MONITORING, ED - Abnormal; Notable for the following components:   Glucose-Capillary 123 (*)    All other components within normal limits  RESP PANEL BY RT-PCR (FLU A&B, COVID) ARPGX2  CULTURE, BLOOD (ROUTINE X 2)  CULTURE, BLOOD (ROUTINE X 2)  APTT  BRAIN NATRIURETIC PEPTIDE  LACTIC ACID, PLASMA  LACTIC ACID, PLASMA  HIV ANTIBODY (ROUTINE TESTING W REFLEX)  CBC  MAGNESIUM  PHOSPHORUS  COMPREHENSIVE METABOLIC PANEL  SODIUM  MAGNESIUM  PHOSPHORUS  AMMONIA  TROPONIN I (HIGH SENSITIVITY)    ____________________________________________  EKG: Normal sinus rhythm, ventricular rate 77.  PR 183, QRS 90, QTc 413.  No acute ST elevations. ________________________________________  RADIOLOGY All imaging, including plain films, CT scans, and ultrasounds, independently reviewed by me, and interpretations confirmed via formal radiology reads.  ED MD interpretation:   Chest x-ray: Clear  Official radiology report(s): CT Head Wo Contrast  Result Date: 04/24/2020 CLINICAL DATA:  Bruising to right cheek from fall at [employer] EXAM: CT HEAD WITHOUT CONTRAST CT MAXILLOFACIAL WITHOUT CONTRAST CT CERVICAL SPINE WITHOUT CONTRAST TECHNIQUE: Multidetector CT imaging of the head, cervical spine, and maxillofacial structures were performed using the standard protocol without  intravenous contrast. Multiplanar CT image reconstructions of the cervical spine and maxillofacial structures were also generated. COMPARISON:  CT 07/24/2018 FINDINGS: CT HEAD FINDINGS Brain: No evidence of acute infarction, hemorrhage, hydrocephalus, extra-axial collection or mass lesion/mass effect. Vascular: No hyperdense vessels.  No unexpected calcification Skull: Normal. Negative for fracture or focal lesion. Other: None CT MAXILLOFACIAL FINDINGS Osseous: Mandibular heads are normally position. Mastoid air cells are clear. No mandibular fracture. Pterygoid plates and zygomatic arches are intact. No acute nasal fracture Orbits: Negative. No traumatic or inflammatory finding. Sinuses: Clear. Soft tissues: Mild right premalar soft tissue swelling CT CERVICAL SPINE FINDINGS Alignment: Straightening of the cervical spine. No subluxation. Facet alignment within normal limits. Skull base and vertebrae: No acute fracture. No primary bone lesion or focal pathologic process. Soft tissues and spinal canal: No prevertebral fluid or swelling. No visible canal hematoma. Disc levels: Multiple level degenerative change with moderate disc space narrowing and degenerative change at C4-C5, C5-C6 and C6-C7. Posterior disc osteophyte complex at C4-C5 and C6-C7. Facet degenerative changes at multiple levels. Moderate bilateral foraminal narrowing C4-C5. Upper chest: Negative. Other: None IMPRESSION: 1. Negative non contrasted CT appearance of the brain. 2. Mild right premalar soft tissue swelling. No acute facial bone fracture. 3. Straightening of the cervical spine with degenerative changes. No acute osseous abnormality. Electronically Signed   By: Donavan Foil M.D.   On: 04/24/2020 21:30   CT Cervical Spine Wo Contrast  Result Date: 04/24/2020 CLINICAL DATA:  Bruising to right cheek from fall at  [employer] EXAM: CT HEAD WITHOUT CONTRAST CT MAXILLOFACIAL WITHOUT CONTRAST CT CERVICAL SPINE WITHOUT CONTRAST TECHNIQUE:  Multidetector CT imaging of the head, cervical spine, and maxillofacial structures were performed using the standard protocol without intravenous contrast. Multiplanar CT image reconstructions of the cervical spine and maxillofacial structures were also generated. COMPARISON:  CT 07/24/2018 FINDINGS: CT HEAD FINDINGS Brain: No evidence of acute infarction, hemorrhage, hydrocephalus, extra-axial collection or mass lesion/mass effect. Vascular: No hyperdense vessels.  No unexpected calcification Skull: Normal. Negative for fracture or focal lesion. Other: None CT MAXILLOFACIAL FINDINGS Osseous: Mandibular heads are normally position. Mastoid air cells are clear. No mandibular fracture. Pterygoid plates and zygomatic arches are intact. No acute nasal fracture Orbits: Negative. No traumatic or inflammatory finding. Sinuses: Clear. Soft tissues: Mild right premalar soft tissue swelling CT CERVICAL SPINE FINDINGS Alignment: Straightening of the cervical spine. No subluxation. Facet alignment within normal limits. Skull base and vertebrae: No acute fracture. No primary bone lesion or focal pathologic process. Soft tissues and spinal canal: No prevertebral fluid or swelling. No visible canal hematoma. Disc levels: Multiple level degenerative change with moderate disc space narrowing and degenerative change at C4-C5, C5-C6 and C6-C7. Posterior disc osteophyte complex at C4-C5 and C6-C7. Facet degenerative changes at multiple levels. Moderate bilateral foraminal narrowing C4-C5. Upper chest: Negative. Other: None IMPRESSION: 1. Negative non contrasted CT appearance of the brain. 2. Mild right premalar soft tissue swelling. No acute facial bone fracture. 3. Straightening of the cervical spine with degenerative changes. No acute osseous abnormality. Electronically Signed   By: Donavan Foil M.D.   On: 04/24/2020 21:30   DG Chest Port 1 View  Result Date: 04/24/2020 CLINICAL DATA:  Fall EXAM: PORTABLE CHEST 1 VIEW  COMPARISON:  February 14, 2020 FINDINGS: The heart size and mediastinal contours are within normal limits. Both lungs are clear. The visualized skeletal structures are unremarkable. IMPRESSION: No active disease. Electronically Signed   By: Dahlia Bailiff MD   On: 04/24/2020 18:58   CT CHEST ABDOMEN PELVIS WO CONTRAST  Result Date: 04/24/2020 CLINICAL DATA:  Abdominal/chest trauma, minor EXAM: CT CHEST, ABDOMEN AND PELVIS WITHOUT CONTRAST TECHNIQUE: Multidetector CT imaging of the chest, abdomen and pelvis was performed following the standard protocol without IV contrast. COMPARISON:  CT abdomen pelvis February 14, 2020 48 which FINDINGS: CT CHEST FINDINGS Cardiovascular: Mild aortic atherosclerosis. No thoracic aortic aneurysm. Normal size heart. No significant pericardial effusion/thickening. Mediastinum/Nodes: No discrete thyroid nodule. No pathologically enlarged mediastinal or axillary lymph nodes. Trachea and esophagus are grossly unremarkable. Lungs/Pleura: Lungs are clear. No pleural effusion or pneumothorax. Musculoskeletal: No acute osseous abnormality CT ABDOMEN PELVIS FINDINGS Hepatobiliary: Stable appearing cirrhotic hepatic changes evidence of prior tips shunt. Stable cystic area in the hepatic dome and along the falciform ligament. No worrisome hepatic lesions on noncontrast exam. Gallbladder surgically absent. No biliary ductal dilation Pancreas: Within normal limits. Spleen: Stable splenomegaly. Adrenals/Urinary Tract: Adrenal glands are unremarkable. Kidneys are normal, without renal calculi, focal lesion, or hydronephrosis. Bladder is unremarkable. Stomach/Bowel: Stomach is grossly unremarkable. No suspicious small bowel dilation. Mild wall thickening involving the ascending and transverse colon likely portal colopathy. Vascular/Lymphatic: Aortic atherosclerosis without aneurysmal dilation stable prominent upper abdominal lymph nodes typical sequela of cirrhosis. No retroperitoneal mass or  adenopathy. Reproductive: Status post hysterectomy. No adnexal masses. Other: Interval placement of a right anterior peritoneal catheter with tip in the pelvis. Slightly decreased moderate volume ascites. Similar postsurgical changes along the left anterior abdominal wall. Musculoskeletal: Multilevel degenerative changes spine. No acute osseous  abnormality. IMPRESSION: 1. No noncontrast CT evidence of acute traumatic injury within the chest abdomen or pelvis. 2. Prior tip shunt placement with stable cirrhotic hepatic morphology with sequela of portal hypertension including, stable splenomegaly, mild ascending portal colopathy, and decreased now moderate volume of ascites status post peritoneal catheter placement. 3. Aortic atherosclerosis. Aortic Atherosclerosis (ICD10-I70.0). Electronically Signed   By: Dahlia Bailiff MD   On: 04/24/2020 21:23   CT Maxillofacial Wo Contrast  Result Date: 04/24/2020 CLINICAL DATA:  Bruising to right cheek from fall at [employer] EXAM: CT HEAD WITHOUT CONTRAST CT MAXILLOFACIAL WITHOUT CONTRAST CT CERVICAL SPINE WITHOUT CONTRAST TECHNIQUE: Multidetector CT imaging of the head, cervical spine, and maxillofacial structures were performed using the standard protocol without intravenous contrast. Multiplanar CT image reconstructions of the cervical spine and maxillofacial structures were also generated. COMPARISON:  CT 07/24/2018 FINDINGS: CT HEAD FINDINGS Brain: No evidence of acute infarction, hemorrhage, hydrocephalus, extra-axial collection or mass lesion/mass effect. Vascular: No hyperdense vessels.  No unexpected calcification Skull: Normal. Negative for fracture or focal lesion. Other: None CT MAXILLOFACIAL FINDINGS Osseous: Mandibular heads are normally position. Mastoid air cells are clear. No mandibular fracture. Pterygoid plates and zygomatic arches are intact. No acute nasal fracture Orbits: Negative. No traumatic or inflammatory finding. Sinuses: Clear. Soft tissues: Mild  right premalar soft tissue swelling CT CERVICAL SPINE FINDINGS Alignment: Straightening of the cervical spine. No subluxation. Facet alignment within normal limits. Skull base and vertebrae: No acute fracture. No primary bone lesion or focal pathologic process. Soft tissues and spinal canal: No prevertebral fluid or swelling. No visible canal hematoma. Disc levels: Multiple level degenerative change with moderate disc space narrowing and degenerative change at C4-C5, C5-C6 and C6-C7. Posterior disc osteophyte complex at C4-C5 and C6-C7. Facet degenerative changes at multiple levels. Moderate bilateral foraminal narrowing C4-C5. Upper chest: Negative. Other: None IMPRESSION: 1. Negative non contrasted CT appearance of the brain. 2. Mild right premalar soft tissue swelling. No acute facial bone fracture. 3. Straightening of the cervical spine with degenerative changes. No acute osseous abnormality. Electronically Signed   By: Donavan Foil M.D.   On: 04/24/2020 21:30    ____________________________________________  PROCEDURES   Procedure(s) performed (including Critical Care):  .Critical Care Performed by: Duffy Bruce, MD Authorized by: Duffy Bruce, MD   Critical care provider statement:    Critical care time (minutes):  35   Critical care time was exclusive of:  Separately billable procedures and treating other patients and teaching time   Critical care was necessary to treat or prevent imminent or life-threatening deterioration of the following conditions:  Cardiac failure, circulatory failure, respiratory failure, sepsis and metabolic crisis   Critical care was time spent personally by me on the following activities:  Development of treatment plan with patient or surrogate, discussions with consultants, evaluation of patient's response to treatment, examination of patient, obtaining history from patient or surrogate, ordering and performing treatments and interventions, ordering and review  of laboratory studies, ordering and review of radiographic studies, pulse oximetry, re-evaluation of patient's condition and review of old charts   I assumed direction of critical care for this patient from another provider in my specialty: no      ____________________________________________  INITIAL IMPRESSION / MDM / [place] / ED COURSE  As part of my medical decision making, I reviewed the following data within the [facility] notes reviewed and incorporated, Old chart reviewed, Notes from prior ED visits, and [place] Controlled Substance Database       *  Lori Mckenzie was evaluated in Emergency Department on 04/24/2020 for the symptoms described in the history of present illness. She was evaluated in the context of the global COVID-19 pandemic, which necessitated consideration that the patient might be at risk for infection with the SARS-CoV-2 virus that causes COVID-19. Institutional protocols and algorithms that pertain to the evaluation of patients at risk for COVID-19 are in a state of rapid change based on information released by regulatory bodies including the CDC and federal and state organizations. These policies and algorithms were followed during the patient's care in the ED.  Some ED evaluations and interventions may be delayed as a result of limited staffing during the pandemic.*     Medical Decision Making: 55 year old female here with confusion, profound hypotension.  Patient was blood pressure in the 60s on arrival.  She was started on fluids and peripheral Levophed as she remained hypotensive after 2 L bolus.  Patient is encephalopathic but without focal deficits.  CT head negative.  CT of the chest, abdomen, and pelvis reviewed and shows no acute surgical abnormality.  Her lab work reveals severe AKI with hyperkalemia, otherwise is actually fairly reassuring.  Her LFTs are at baseline.  Patient temporized, will give fluids, and admit for persistent  hypotension in the setting of profound AKI and possible sepsis.  Primary source at this time would be consideration of SBP given her recent paracentesis.  She has been given broad-spectrum antibiotics.  ____________________________________________  FINAL CLINICAL IMPRESSION(S) / ED DIAGNOSES  Final diagnoses:  Fall, initial encounter  Sepsis with acute organ dysfunction without septic shock, due to unspecified organism, unspecified type ([place])  Hypotension, unspecified hypotension type     MEDICATIONS GIVEN DURING THIS VISIT:  Medications  norepinephrine (LEVOPHED) 71m in 2557mpremix infusion (10 mcg/min Intravenous Rate/Dose Change 04/24/20 2244)  docusate sodium (COLACE) capsule 100 mg (has no administration in time range)  polyethylene glycol (MIRALAX / GLYCOLAX) packet 17 g (has no administration in time range)  enoxaparin (LOVENOX) injection 40 mg (has no administration in time range)  pantoprazole (PROTONIX) injection 40 mg (has no administration in time range)  cefTRIAXone (ROCEPHIN) 2 g in sodium chloride 0.9 % 100 mL IVPB (has no administration in time range)  0.9 %  sodium chloride infusion (0 mLs Intravenous Stopped 04/24/20 1925)  sodium chloride 0.9 % bolus 1,000 mL (0 mLs Intravenous Stopped 04/24/20 2006)  albumin human 5 % solution 25 g (12.5 g Intravenous New Bag/Given 04/24/20 2008)  ceFEPIme (MAXIPIME) 2 g in sodium chloride 0.9 % 100 mL IVPB (0 g Intravenous Stopped 04/24/20 1936)  metroNIDAZOLE (FLAGYL) IVPB 500 mg (0 mg Intravenous Stopped 04/24/20 2100)  vancomycin (VANCOCIN) IVPB 1000 mg/200 mL premix (0 mg Intravenous Stopped 04/24/20 2205)  calcium gluconate 1 g/ 50 mL sodium chloride IVPB (0 g Intravenous Stopped 04/24/20 2031)  insulin aspart (novoLOG) injection 5 Units (5 Units Intravenous Given 04/24/20 2034)  dextrose 50 % solution 50 mL (50 mLs Intravenous Given 04/24/20 2033)     ED Discharge Orders    None     Flick Barinski  Note:  This document was  prepared using Dragon voice recognition software and may include unintentional dictation errors.   IsDuffy BruceMD 04/24/20 [phone]

## 2020-04-24 NOTE — H&P (Addendum)
PCCM ATTENDING ATTESTATION:    Patient seen and examined and relevant ancillary tests reviewed.   I agree with the assessment and plan of care as outlined by Lori Pulse NP.   This patient  was not seen as a shared visit. The following reflects my independent critical  care time.  I  personally  reviewed database in its entirety and discussed care plan in detail. In addition, this patient was discussed on multidisciplinary rounds.     I agree with assessment and plan.    Septic shock due to SBP  -use vasopressors to keep MAP>65    ACUTE KIDNEY INJURY/Renal Failure  -continue Foley Catheter-assess need  -Avoid nephrotoxic agents  -Follow urine output, BMP  -Ensure adequate renal perfusion, optimize oxygenation  -Renal dose medications    Liver Failure/encephalopathy    Acute Encephalopathy multifactorial in the setting of high dose chronic pain medications, malnutrition, possible infection & acute on chronic hyponatremia      Best practice  ( right click and "Reselect all SmartList Selections" daily)   Diet:NPO  Pain/Anxiety/Delirium protocol  (if indicated): No  VAP protocol  (if indicated): Not indicated  DVT prophylaxis:LMWH  GI prophylaxis:PPI  Glucose control:SSIYes  Central venous access:N/A  Arterial line:N/A  Foley:N/A  Mobility:bed rest   PT consulted:N/A  Last date of multidisciplinary goals of care discussion 04/24/20  Code Status:DNR  Disposition: ICU  Critical Care Time devoted to patient care services described in this note is total care time 76 mins     Overall, patient is critically ill, prognosis is guarded.  Patient with Multiorgan failure and at high risk for cardiac arrest and death.       Lori Mckenzie, M.D.   [facility]   Medical Director [place] Director [facility] Cardio-Pulmonary Department                NAME:  Lori Mckenzie, MRN:   [id number], DOB:  [birth year], LOS: 0 ADMISSION DATE:  04/24/2020, CONSULTATION DATE:  04/24/20 REFERRING MD:  Dr. Ellender Hose, CHIEF COMPLAINT:  Fall   History of Present Illness:  55 year old female with extensive past medical history including severe cirrhosis, on home hospice arriving to the [facility] ED after a fall while out shopping at [place] with her sister on 04/24/20. ED course: Per ED documentation and chart review the patient was just released from a nursing home after a recent hospital admission.  She is recently moved to [facility] to live with her sister, Lori Mckenzie.  And was also recently transferred to local home hospice services.  Patient was very disoriented upon arrival, intermittently lethargic and very weak.  She complained of right facial pain, right-sided chest pain and diffuse abdominal pain.  Per ED documentation she was walking around shopping when she was noted to seemingly leaned forward falling. Initial vital signs: Per Dr. Ellender Hose the patient was profoundly hypotensive upon arrival with an SBP in the 60s and was started on IV fluids and peripheral Levophed.  For set of documented vitals: Temp 98.2, RR 16, HR 88, BP 81/28 (52) & 100% SPO2 on room air. Significant labs: Hyponatremic at 122, hyperkalemic at 6.1, hypochloremia at 97, AKI with BUN/Cr-46/1.89, serum CO2 20, albumin 2.5, hemoglobin 9.3, troponin negative at 8, BNP negative at 9.2 & lactic unimpressive at 1.4 > 1.8.  CT head/abdomen/pelvis/maxillary & cervical spine all negative for acute fractures/abnormalities, with the exception of known NASH & soft tissue swelling on right premolar area.  PCCM  consulted due to continued need for vasopressors.  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 04/24/2020-admit to ICU on vasopressors  Interim History / Subjective:  Patient lying on ED stretcher drowsy/lethargic, able to tell me the year was 2022 but shook her head no when asked the month.  She stated she thought she was at [facility] and was unable to describe the precipitating events of her fall. Spoke with her son, Lori Mckenzie, via telephone who confirmed she completed paperwork and wanted to be a DNR/DNI and agreed that CVC placement would be against her wishes.  He also confirmed that she would want to be treated with antibiotics, vasopressors and IV fluids. We discussed at length the challenge we might have controlling her pain while keeping her blood pressure stable.  Sister Lori Mckenzie in room all questions answered at this time.  Objective   Blood pressure (!) 120/58, Mckenzie 86, temperature 98.7 F (37.1 C), temperature source Oral, resp. rate 15, height 5' 8"  (1.727 m), weight 83.9 kg, SpO2 100 %.        Intake/Output Summary (Last 24 hours) at 04/24/2020 2302 Last data filed at 04/24/2020 2205 Gross per 24 hour  Intake 2446.03 ml  Output --  Net 2446.03 ml   Filed Weights   04/24/20 1801  Weight: 83.9 kg    Examination: General: Adult female, critically/chronically ill, lying in bed, lethargic but moaning in pain HEENT: MM pink/moist, anicteric, atraumatic, neck supple Neuro: A&O x 2, able to follow intermittent commands commands, PERRL +4, MAE CV: s1s2 RRR, NSR on monitor, no r/m/g Pulm: Regular, non labored on room air, breath sounds diminished throughout GI: soft, slightly distended, tenderness to palpation of RUQ, bs x 4 GU: Pure wick in place-no urine noted Skin: Open abrasion on right cheek with bruising, scattered ecchymosis bilateral upper arms Extremities: warm/dry, pulses + 2 R/P, +2 edema noted bilateral feet and ankles  Labs/imaging that I have personally reviewed  (right click and "Reselect all SmartList Selections"  daily)  EKG Interpretation Date: 04/24/20 EKG Time: 18:16 Rate: 77 Rhythm: NSR QRS Axis:  possible LAD Intervals: normal ST/T Wave abnormalities: peaked T waves (however low voltage throughout EKG) Narrative Interpretation: NSR with peaked T waves  Na+/ K+: 122/ 6.1 BUN/Cr.: 46/ 1.89 Serum CO2/ AG: 20/5  Hgb: 9.3 Troponin: 8 BNP: 9.2  WBC/ TMAX: 6.0/ 37.1 Lactic/ PCT: 1.4 > 1.8/ pending  04/24/20 CT maxillofacial wo contrast >> mild right premolar soft tissue swelling, no acute facial bone fracture 04/24/2020 CT cervical spine without contrast >> straightening of the cervical spine with degenerative changes no acute osseous abnormality 04/24/2020 CT head/abdomen/pelvis without contrast >> no acute intracranial abnormality. Prior TIPS shunt placement with stable cirrhotic hepatic morphology with sequela of portal hypertension including stable splenomegaly, mild a sending portal colopathy and decreased moderate volume ascites status post peritoneal catheter placement. No acute trauma noted CXR 04/24/2020: No active disease Resolved Hospital Problem list     Assessment & Plan:  Suspected Spontaneous Bacterial Peritonitis PMHx: NASH cirrhosis with refractory ascites s/p pleurX catheter placement Patient received empiric ABX in ED: flagyl, cefepime, vancomycin. PleurX catheter placed 03/19/20. Sister reports RN came and drained some fluid earlier on 04/24/20. Low suspicion for SBP: afebrile, no leukocytosis, no lactic acidosis, PCT pending.  However due to AMS and generalized abdominal tenderness with pleurX catheter in place & moderate ascites on abdominal CT - will cover with antibiotics for now until GI has assessed the patient. - send sample of peritoneal fluid for culture & testing - Ceftriaxone 2 g Q 24 h for empiric coverage - f/u cultures, trend PCT - daily CBC, monitor WBC/fever curve - GI consulted, appreciate input  Syncope and collapse Hypotension due suspected hypovolemia in  the setting of malnutrition Hypoalbuminemia Patient received 2 L NS and albumin in ED, ultimately requiring levophed drip - continue levophed, wean as tolerated to maintain MAP > 65 - continuous cardiac monitoring - echocardiogram pending  Acute on Chronic Hyponatremia - 122 Patient's baseline sodium over the last 2 years is 130. Suspect this is hypovolemic hyponatremia in the setting of poor PO intake. Patient is slightly altered on admission and received 2 L NS bolus in ED. - recheck STAT Na+ - consider 3% NS if sodium level drops further - consider urine osmolality & urine sodium if no improvement with IVF resuscitation - monitor Na+ Q 2-Q 4 hours as needed  Acute Kidney Injury secondary to suspected hypovolemia in the setting of malnutrition Hyperkalemia- 6.1 Baseline Cr: 0.6, Cr on admission: 1.89 Patient received insulin IV/ D 50 & Ca+ in ED - recheck K+ STAT, consider further treatment if necessary - Strict I/O's: alert provider if UOP < 0.5 mL/kg/hr - gentle IVF hydration  - Daily BMP, replace electrolytes PRN - Avoid nephrotoxic agents as able, ensure adequate renal perfusion - Obtain urine lytes - consider renal US  Acute Encephalopathy multifactorial in the setting of high dose chronic pain medications, malnutrition, possible infection & acute on chronic hyponatremia Chronic pain followed by outpatient hospice PMHx: PTSD, depression Spoke with outpatient hospice RN who confirmed home medications including: MS Contin 45 mg twice daily, oxycodone 10 mg Q6 as needed, Ativan 0.5 mg every 4 as needed & lactulose as needed. - continue home ativan 0.5 mg PRN for anxiety (Q 6 instead of Q 4) - unable to give PO meds d/t lethargy & with added concerns of hypotension, discussed with Rx and equivalent dose to MS contin is 37 mcg/h fentanyl patch. Ordered fentanyl patch 25 mcg/h, can increase/ transition to home PO meds as patient stabilizes - supportive care - avoid sedating  medications as tolerated - Emphasize sleep/wake cycle - palliative care consulted, appreciate input  T2DM - Q 4 CBG monitoring - SSI sensitive d/t to poor PO intake - follow ICU hypo/hyper glycemia protocol  COPD PMHx: OSA, asthma Not on home O2 - bronchodilators PRN - supplemental O2 PRN to maintain SpO2 > 90%  Best practice (right click and "Reselect all SmartList Selections" daily)  Diet:  NPO Pain/Anxiety/Delirium protocol (if indicated): No VAP protocol (if indicated): Not indicated DVT prophylaxis: LMWH GI prophylaxis: PPI Glucose control:  SSI Yes Central venous access:  N/A Arterial line:  N/A Foley:  N/A Mobility:  bed rest  PT consulted: N/A Last date of multidisciplinary goals of care discussion 04/24/20 Code Status:  DNR Disposition: ICU  Labs   CBC: Recent Labs  Lab 04/24/20 1809  WBC 6.0  HGB 9.3*  HCT 28.8*  MCV 81.8  PLT 116*    Basic Metabolic Panel: Recent Labs  Lab 04/24/20 1809  NA 122*  K 6.1*  CL 97*  CO2 20*  GLUCOSE 100*  BUN 46*  CREATININE 1.89*  CALCIUM 8.4*   GFR: Estimated Creatinine Clearance: 37.7 mL/min (A) (by C-G  formula based on SCr of 1.89 mg/dL (H)). Recent Labs  Lab 04/24/20 1809 04/24/20 1840 04/24/20 2019  WBC 6.0  --   --   LATICACIDVEN  --  1.4 1.8    Liver Function Tests: Recent Labs  Lab 04/24/20 1809  AST 40  ALT 27  ALKPHOS 96  BILITOT 1.2  PROT 4.8*  ALBUMIN 2.5*   No results for input(s): LIPASE, AMYLASE in the last 168 hours. No results for input(s): AMMONIA in the last 168 hours.  ABG    Component Value Date/Time   PHART 7.29 (L) 10/21/2016 1720   PCO2ART 65 (H) 10/21/2016 1720   PO2ART 69 (L) 10/21/2016 1720   HCO3 31.3 (H) 10/21/2016 1720   O2SAT 91.3 10/21/2016 1720     Coagulation Profile: Recent Labs  Lab 04/24/20 1809  INR 1.3*    Cardiac Enzymes: No results for input(s): CKTOTAL, CKMB, CKMBINDEX, TROPONINI in the last 168 hours.  HbA1C: Hemoglobin A1C   Date/Time Value Ref Range Status  11/17/2013 04:16 AM 8.0 (H) 4.2 - 6.3 % Final    Comment:    The American Diabetes Association recommends that a primary goal of therapy should be <7% and that physicians should reevaluate the treatment regimen in patients with HbA1c values consistently >8%.   04/26/2013 04:39 PM 6.3 4.2 - 6.3 % Final    Comment:    The American Diabetes Association recommends that a primary goal of therapy should be <7% and that physicians should reevaluate the treatment regimen in patients with HbA1c values consistently >8%.    Hgb A1c MFr Bld  Date/Time Value Ref Range Status  06/12/2017 06:17 AM 9.0 (H) 4.8 - 5.6 % Final    Comment:    (NOTE) Pre diabetes:          5.7%-6.4% Diabetes:              >6.4% Glycemic control for   <7.0% adults with diabetes   07/12/2014 06:57 PM 5.7 4.0 - 6.0 % Final    CBG: Recent Labs  Lab 04/24/20 2033  GLUCAP 123*    Review of Systems: Positives in BOLD (challenging due to lethargy)  Gen: Denies fever, chills, weight change, fatigue, night sweats HEENT: Denies blurred vision, double vision, hearing loss, tinnitus, sinus congestion, rhinorrhea, sore throat, neck stiffness, dysphagia PULM: Denies shortness of breath, cough, sputum production, hemoptysis, wheezing CV: Denies chest pain, edema, orthopnea, paroxysmal nocturnal dyspnea, palpitations GI: Denies abdominal pain, nausea, vomiting, diarrhea, hematochezia, melena, constipation, change in bowel habits GU: Denies dysuria, hematuria, polyuria, oliguria, urethral discharge Endocrine: Denies hot or cold intolerance, polyuria, polyphagia or appetite change Derm: Denies rash, dry skin, scaling or peeling skin change Heme: Denies easy bruising, bleeding, bleeding gums Neuro: Denies headache, numbness, weakness, slurred speech, loss of memory or consciousness  Past Medical History:  She,  has a past medical history of Abdominal abscess (08/25/2014), Acid reflux  (08/10/2010), Acute cervical myofascial strain (02/09/2016), Acute postoperative pain (08/09/2016), Anxiety, Ascites, Asthma, Back pain, Bile leak, postoperative (07/17/2014), Brittle bone disease, Cancer ([place]), Cervical disc disease, Chronic kidney disease, Collagen vascular disease ([place]), COPD (chronic obstructive pulmonary disease) ([place]), Diabetes mellitus without complication ([place]), GERD (gastroesophageal reflux disease), Hypertension, Hypothyroidism, Left upper quadrant pain (01/09/2014), Major depressive disorder with single episode (12/05/2011), Major depressive disorder, single episode (12/05/2011), Migraines, NASH (nonalcoholic steatohepatitis), Respiratory infection, Shock ([place]) (09/18/2014), Sleep apnea, Sleep apnea, Syncope (11/16/2014), Thyroid disease, and TIA (transient ischemic attack).   Surgical History:   Past Surgical History:  Procedure Laterality Date  . ABDOMINAL HYSTERECTOMY    . CHOLECYSTECTOMY N/A 07/15/2014   Procedure: LAPAROSCOPIC CHOLECYSTECTOMY with liver biopsy ;  Surgeon: Sherri Rad, MD;  Location: [facility] ORS;  Service: General;  Laterality: N/A;  . COLONOSCOPY WITH PROPOFOL N/A 06/23/2014   Procedure: COLONOSCOPY WITH PROPOFOL;  Surgeon: Lollie Sails, MD;  Location: [facility] ENDOSCOPY;  Service: Endoscopy;  Laterality: N/A;  . ERCP N/A 07/16/2014   Procedure: ENDOSCOPIC RETROGRADE CHOLANGIOPANCREATOGRAPHY (ERCP);  Surgeon: Clarene Essex, MD;  Location: [facility] ENDOSCOPY;  Service: Endoscopy;  Laterality: N/A;  . ERCP N/A 10/03/2014   Procedure: ENDOSCOPIC RETROGRADE CHOLANGIOPANCREATOGRAPHY (ERCP);  Surgeon: Hulen Luster, MD;  Location: [facility] ENDOSCOPY;  Service: Gastroenterology;  Laterality: N/A;  . ESOPHAGOGASTRODUODENOSCOPY N/A 06/23/2014   Procedure: ESOPHAGOGASTRODUODENOSCOPY (EGD);  Surgeon: Lollie Sails, MD;  Location: [facility] ENDOSCOPY;  Service: Endoscopy;  Laterality: N/A;  . ESOPHAGOGASTRODUODENOSCOPY N/A 06/30/2016   Procedure: ESOPHAGOGASTRODUODENOSCOPY (EGD);  Surgeon: Jonathon Bellows, MD;  Location: [facility] ENDOSCOPY;  Service: Endoscopy;  Laterality: N/A;  . ESOPHAGOGASTRODUODENOSCOPY (EGD) WITH PROPOFOL N/A 12/01/2015   Procedure: ESOPHAGOGASTRODUODENOSCOPY (EGD) WITH PROPOFOL;  Surgeon: Lollie Sails, MD;  Location: [facility] ENDOSCOPY;  Service: Endoscopy;  Laterality: N/A;  . TUBAL LIGATION    . WISDOM TOOTH EXTRACTION       Social History:   reports that she has never smoked. She has never used smokeless tobacco. She reports that she does not drink alcohol and does not use drugs.   Family History:  Her family history includes Emphysema in her father; Heart disease in her brother and sister; Lung cancer in her mother; Ulcers in her father and sister.   Allergies Allergies  Allergen Reactions  . Aspirin     unknown  . Nsaids Other (See Comments)    Liver problems  . Tape Swelling  . Tylenol [Acetaminophen]     Not specified  . Vicodin [Hydrocodone-Acetaminophen] Itching and Rash     Home Medications  Prior to Admission medications   Medication Sig Start Date End Date Taking? Authorizing Provider  albuterol (PROVENTIL HFA;VENTOLIN HFA) 108 (90 Base) MCG/ACT inhaler Inhale 2 puffs into the lungs every 6 (six) hours as needed for wheezing or shortness of breath.    [provider]  ALPRAZolam Duanne Moron) 1 MG tablet Take 1 mg by mouth 3 (three) times daily as needed.     [provider]  cholecalciferol (VITAMIN D) 1000 units tablet Take 2,000 Units by mouth daily.    [provider]  citalopram (CELEXA) 40 MG tablet Take 40 mg by mouth daily.  07/24/15   [provider]  diclofenac sodium (VOLTAREN) 1 % GEL Apply 4 g topically every 8 (eight) hours as needed. Patient taking differently: Apply 4 g topically 2 (two) times daily.  12/06/16 08/20/18  Milinda Pointer, MD  dicyclomine (BENTYL) 10 MG capsule Take 10 mg by mouth 2 (two) times daily. 07/23/18   [provider]  fluticasone (FLONASE) 50 MCG/ACT nasal spray  Place 1-2 sprays into both nostrils daily as needed for rhinitis.     [provider]  Folate-B12-Intrinsic Factor (INTRINSI B12-FOLATE) [phone] MCG-MCG-MG TABS Take 1 tablet by mouth daily.     [provider]  gabapentin (NEURONTIN) 300 MG capsule Take 900 mg by mouth 3 (three) times daily.     [provider]  insulin regular human CONCENTRATED (HUMULIN R) 500 UNIT/ML injection Inject 100-200 Units into the skin 2 (two) times a day. Inject 200 units in the morning, inject  100 units nightly. Based on glucose readings    [provider]  lactulose (CHRONULAC) 10 GM/15ML solution Take 30 mLs (20 g total) by mouth 3 (three) times daily. Patient taking differently: Take 40 g by mouth 4 (four) times daily.  10/26/16   Nicholes Mango, MD  magnesium oxide (MAG-OX) 400 MG tablet Take 400 mg by mouth daily.    [provider]  nystatin (MYCOSTATIN/NYSTOP) powder Apply 1 g topically 3 (three) times daily as needed. For irritation 10/26/16   Gouru, Illene Silver, MD  Oxycodone HCl 10 MG TABS Take 10 mg by mouth 3 (three) times daily.     [provider]  pantoprazole (PROTONIX) 40 MG tablet Take 40 mg by mouth 2 (two) times daily before a meal.    [provider]  potassium chloride SA (K-DUR,KLOR-CON) 20 MEQ tablet Take 20 mEq by mouth daily as needed. For high blood pressure    [provider]  promethazine (PHENERGAN) 12.5 MG tablet Take 2 tablets (25 mg total) by mouth every 6 (six) hours as needed for nausea or vomiting. Patient taking differently: Take 12.5-25 mg by mouth every 6 (six) hours as needed for nausea or vomiting.  09/25/16   Dustin Flock, MD  rifaximin (XIFAXAN) 550 MG TABS tablet Take 550 mg by mouth daily.     [provider]  spironolactone (ALDACTONE) 100 MG tablet Take 400 mg by mouth daily.     [provider]  vitamin B-12 (CYANOCOBALAMIN) 1000 MCG tablet Take 2,000 mcg by mouth daily.    [provider]  SUMAtriptan (IMITREX) 50 MG tablet Take 50 mg by mouth every 2 (two) hours as needed. For migraines  03/17/11  [provider]        Lori Mckenzie [place], AGACNP-BC Acute Care Nurse Practitioner [place] [facility]   [phone] / [phone] Please see Amion for pager details.

## 2020-04-24 NOTE — Progress Notes (Signed)
PHARMACY -  BRIEF ANTIBIOTIC NOTE   Pharmacy has received consult(s) for Cefepime and Vancomycin from an ED provider.  The patient's profile has been reviewed for ht/wt/allergies/indication/available labs.    One time order(s) placed for Cefepime 2g and Vancomycin 1g by ED provider.  Further antibiotics/pharmacy consults should be ordered by admitting physician if indicated.                       Thank you, Vira Blanco 04/24/2020  6:35 PM

## 2020-04-24 NOTE — ED Notes (Signed)
Pt to CT

## 2020-04-24 NOTE — Progress Notes (Signed)
CODE SEPSIS - PHARMACY COMMUNICATION  **Broad Spectrum Antibiotics should be administered within 1 hour of Sepsis diagnosis**  Time Code Sepsis Called/Page Received: 18:30  Antibiotics Ordered: Cefepime, Vancomycin, Metronidazole  Time of 1st antibiotic administration:    Additional action taken by pharmacy:    If necessary, Name of Provider/Nurse Contacted:      Vira Blanco ,PharmD Clinical Pharmacist  04/24/2020  6:36 PM

## 2020-04-24 NOTE — ED Triage Notes (Addendum)
Pt to ED POV for fall. Released from hospice yesterday. Bruising noted to right cheek from fall at [place] today Pt intermittently responsive to verbal stimuli. Not answering questions appropriately or following commands. Chris PA to bedside.  Family reports pt taking morphine and xanax PTA

## 2020-04-24 NOTE — ED Notes (Signed)
Patient transported to CT by two RNs on monitor.

## 2020-04-24 NOTE — Consult Note (Signed)
CODE SEPSIS - PHARMACY COMMUNICATION  **Broad Spectrum Antibiotics should be administered within 1 hour of Sepsis diagnosis**  Time Code Sepsis Called/Page Received: 1830  Antibiotics Ordered: cefepime, metronidazole  Time of 1st antibiotic administration: 1926  Additional action taken by pharmacy: n/a  If necessary, Name of Provider/Nurse Contacted: n/a    Dorothe Pea ,PharmD, BCPS 04/24/2020  7:55 PM

## 2020-04-24 NOTE — ED Notes (Signed)
Attempted to call report to the ICU. RN to call back.

## 2020-04-24 NOTE — ED Notes (Signed)
Patient back to room from CT

## 2020-04-25 ENCOUNTER — Inpatient Hospital Stay
Admit: 2020-04-25 | Discharge: 2020-04-25 | Disposition: A | Discharge status: Home / Self Care | Attending: Pulmonary Disease | Admitting: Pulmonary Disease

## 2020-04-25 DIAGNOSIS — A419 Sepsis, unspecified organism: Secondary | ICD-10-CM | POA: Diagnosis not present

## 2020-04-25 DIAGNOSIS — N179 Acute kidney failure, unspecified: Secondary | ICD-10-CM

## 2020-04-25 DIAGNOSIS — E871 Hypo-osmolality and hyponatremia: Secondary | ICD-10-CM

## 2020-04-25 DIAGNOSIS — K7460 Unspecified cirrhosis of liver: Secondary | ICD-10-CM | POA: Diagnosis not present

## 2020-04-25 DIAGNOSIS — R6520 Severe sepsis without septic shock: Secondary | ICD-10-CM | POA: Diagnosis not present

## 2020-04-25 DIAGNOSIS — R55 Syncope and collapse: Secondary | ICD-10-CM

## 2020-04-25 DIAGNOSIS — E875 Hyperkalemia: Secondary | ICD-10-CM

## 2020-04-25 LAB — GLUCOSE, CAPILLARY
Glucose-Capillary: 118 mg/dL — ABNORMAL HIGH (ref 70–99)
Glucose-Capillary: 124 mg/dL — ABNORMAL HIGH (ref 70–99)
Glucose-Capillary: 133 mg/dL — ABNORMAL HIGH (ref 70–99)
Glucose-Capillary: 133 mg/dL — ABNORMAL HIGH (ref 70–99)
Glucose-Capillary: 145 mg/dL — ABNORMAL HIGH (ref 70–99)
Glucose-Capillary: 166 mg/dL — ABNORMAL HIGH (ref 70–99)

## 2020-04-25 LAB — CBC
HCT: 27 % — ABNORMAL LOW (ref 36.0–46.0)
Hemoglobin: 9.1 g/dL — ABNORMAL LOW (ref 12.0–15.0)
MCH: 26.5 pg (ref 26.0–34.0)
MCHC: 33.7 g/dL (ref 30.0–36.0)
MCV: 78.5 fL — ABNORMAL LOW (ref 80.0–100.0)
Platelets: 91 10*3/uL — ABNORMAL LOW (ref 150–400)
RBC: 3.44 MIL/uL — ABNORMAL LOW (ref 3.87–5.11)
RDW: 19.4 % — ABNORMAL HIGH (ref 11.5–15.5)
WBC: 5 10*3/uL (ref 4.0–10.5)
nRBC: 0 % (ref 0.0–0.2)

## 2020-04-25 LAB — HEMOGLOBIN A1C
Hgb A1c MFr Bld: 5.9 % — ABNORMAL HIGH (ref 4.8–5.6)
Mean Plasma Glucose: 122.63 mg/dL

## 2020-04-25 LAB — ECHOCARDIOGRAM COMPLETE
AR max vel: 2.07 cm2
AV Peak grad: 9.6 mmHg
Ao pk vel: 1.55 m/s
Area-P 1/2: 4.15 cm2
Height: 62 in
S' Lateral: 3.11 cm
Weight: 3527.36 oz

## 2020-04-25 LAB — COMPREHENSIVE METABOLIC PANEL
ALT: 22 U/L (ref 0–44)
AST: 33 U/L (ref 15–41)
Albumin: 2.5 g/dL — ABNORMAL LOW (ref 3.5–5.0)
Alkaline Phosphatase: 81 U/L (ref 38–126)
Anion gap: 5 (ref 5–15)
BUN: 40 mg/dL — ABNORMAL HIGH (ref 6–20)
CO2: 22 mmol/L (ref 22–32)
Calcium: 8.1 mg/dL — ABNORMAL LOW (ref 8.9–10.3)
Chloride: 99 mmol/L (ref 98–111)
Creatinine, Ser: 1.45 mg/dL — ABNORMAL HIGH (ref 0.44–1.00)
GFR, Estimated: 42 mL/min — ABNORMAL LOW (ref >60)
Glucose, Bld: 101 mg/dL — ABNORMAL HIGH (ref 70–99)
Potassium: 5 mmol/L (ref 3.5–5.1)
Sodium: 126 mmol/L — ABNORMAL LOW (ref 135–145)
Total Bilirubin: 1.2 mg/dL (ref 0.3–1.2)
Total Protein: 4.6 g/dL — ABNORMAL LOW (ref 6.5–8.1)

## 2020-04-25 LAB — BODY FLUID CELL COUNT WITH DIFFERENTIAL
Eos, Fluid: 0 %
Lymphs, Fluid: 16 %
Monocyte-Macrophage-Serous Fluid: 40 %
Neutrophil Count, Fluid: 44 %
Total Nucleated Cell Count, Fluid: 179 cu mm

## 2020-04-25 LAB — SODIUM
Sodium: 122 mmol/L — ABNORMAL LOW (ref 135–145)
Sodium: 126 mmol/L — ABNORMAL LOW (ref 135–145)
Sodium: 122 mmol/L — ABNORMAL LOW (ref 135–145)
Sodium: 124 mmol/L — ABNORMAL LOW (ref 135–145)
Sodium: 122 mmol/L — ABNORMAL LOW (ref 135–145)

## 2020-04-25 LAB — AMYLASE, PLEURAL OR PERITONEAL FLUID: Amylase, Fluid: 9 U/L

## 2020-04-25 LAB — PROCALCITONIN
Procalcitonin: <0.1 ng/mL
Procalcitonin: <0.1 ng/mL

## 2020-04-25 LAB — HIV ANTIBODY (ROUTINE TESTING W REFLEX): HIV Screen 4th Generation wRfx: NONREACTIVE

## 2020-04-25 LAB — OSMOLALITY: Osmolality: 273 mOsm/kg — ABNORMAL LOW (ref 275–295)

## 2020-04-25 LAB — MAGNESIUM: Magnesium: 2.2 mg/dL (ref 1.7–2.4)

## 2020-04-25 LAB — POTASSIUM: Potassium: 6.7 mmol/L (ref 3.5–5.1)

## 2020-04-25 LAB — PHOSPHORUS: Phosphorus: 3.3 mg/dL (ref 2.5–4.6)

## 2020-04-25 LAB — LACTATE DEHYDROGENASE: LDH: 195 U/L — ABNORMAL HIGH (ref 98–192)

## 2020-04-25 MED ORDER — CHLORHEXIDINE GLUCONATE CLOTH 2 % EX PADS
6.0000 | MEDICATED_PAD | Freq: Every day | CUTANEOUS | Status: DC
  Administered 2020-04-26 – 2020-04-29 (×3): 6 via TOPICAL

## 2020-04-25 MED ORDER — MIDODRINE HCL 5 MG PO TABS
10.0000 mg | ORAL_TABLET | Freq: Three times a day (TID) | ORAL | Status: DC
  Administered 2020-04-25 – 2020-05-01 (×17): 10 mg via ORAL
  Filled 2020-04-25 (×17): qty 2

## 2020-04-25 MED ORDER — LACTULOSE 10 GM/15ML PO SOLN
10.0000 g | Freq: Two times a day (BID) | ORAL | Status: DC
  Administered 2020-04-25 – 2020-04-26 (×3): 10 g via ORAL
  Filled 2020-04-25 (×10): qty 30

## 2020-04-25 MED ORDER — PATIROMER SORBITEX CALCIUM 8.4 G PO PACK
8.4000 g | PACK | Freq: Every day | ORAL | Status: DC
  Filled 2020-04-25: qty 1

## 2020-04-25 MED ORDER — HYDROMORPHONE HCL 1 MG/ML IJ SOLN: 1.0000 mg | Freq: Once | INTRAMUSCULAR | Status: DC

## 2020-04-25 MED ORDER — SODIUM CHLORIDE 3 % IV BOLUS
100.0000 mL | Freq: Once | INTRAVENOUS | Status: AC
  Administered 2020-04-25: 100 mL via INTRAVENOUS
  Filled 2020-04-25: qty 500

## 2020-04-25 MED ORDER — DEXTROSE 50 % IV SOLN
1.0000 | Freq: Once | INTRAVENOUS | Status: AC
  Administered 2020-04-25: 50 mL via INTRAVENOUS
  Filled 2020-04-25: qty 50

## 2020-04-25 MED ORDER — MORPHINE SULFATE (PF) 2 MG/ML IV SOLN
2.0000 mg | Freq: Once | INTRAVENOUS | Status: AC
  Administered 2020-04-25: 2 mg via INTRAVENOUS
  Filled 2020-04-25: qty 1

## 2020-04-25 MED ORDER — INSULIN ASPART 100 UNIT/ML ~~LOC~~ SOLN
0.0000 [IU] | SUBCUTANEOUS | Status: DC
  Administered 2020-04-25 – 2020-04-26 (×5): 1 [IU] via SUBCUTANEOUS
  Administered 2020-04-26: 2 [IU] via SUBCUTANEOUS
  Administered 2020-04-26: 1 [IU] via SUBCUTANEOUS
  Administered 2020-04-26: 2 [IU] via SUBCUTANEOUS
  Administered 2020-04-27 – 2020-04-28 (×5): 1 [IU] via SUBCUTANEOUS
  Filled 2020-04-25 (×13): qty 1

## 2020-04-25 MED ORDER — RIFAXIMIN 200 MG PO TABS
200.0000 mg | ORAL_TABLET | Freq: Two times a day (BID) | ORAL | Status: DC
  Administered 2020-04-25 – 2020-05-01 (×13): 200 mg via ORAL
  Filled 2020-04-25 (×15): qty 1

## 2020-04-25 MED ORDER — OCTREOTIDE ACETATE 100 MCG/ML IJ SOLN
100.0000 ug | Freq: Three times a day (TID) | INTRAMUSCULAR | Status: DC
  Administered 2020-04-25 – 2020-04-28 (×9): 100 ug via SUBCUTANEOUS
  Filled 2020-04-25 (×11): qty 1

## 2020-04-25 MED ORDER — SODIUM BICARBONATE 8.4 % IV SOLN
INTRAVENOUS | Status: AC
  Filled 2020-04-25: qty 50

## 2020-04-25 MED ORDER — INSULIN ASPART 100 UNIT/ML IV SOLN
10.0000 [IU] | Freq: Once | INTRAVENOUS | Status: AC
  Administered 2020-04-25: 10 [IU] via INTRAVENOUS
  Filled 2020-04-25: qty 0.1

## 2020-04-25 MED ORDER — SODIUM BICARBONATE 8.4 % IV SOLN
INTRAVENOUS | Status: AC
  Administered 2020-04-25: 100 meq via INTRAVENOUS
  Filled 2020-04-25: qty 50

## 2020-04-25 MED ORDER — OXYCODONE HCL 5 MG PO TABS
10.0000 mg | ORAL_TABLET | Freq: Four times a day (QID) | ORAL | Status: DC | PRN
Start: 2020-04-25 — End: 2020-04-26
  Administered 2020-04-25 – 2020-04-26 (×3): 10 mg via ORAL
  Filled 2020-04-25 (×3): qty 2

## 2020-04-25 MED ORDER — MORPHINE SULFATE (PF) 2 MG/ML IV SOLN
2.0000 mg | INTRAVENOUS | Status: DC | PRN
  Administered 2020-04-25 – 2020-04-29 (×10): 2 mg via INTRAVENOUS
  Filled 2020-04-25 (×10): qty 1

## 2020-04-25 MED ORDER — SODIUM CHLORIDE 0.9 % IV SOLN
50.0000 ug/h | INTRAVENOUS | Status: DC
  Administered 2020-04-25: 50 ug/h via INTRAVENOUS
  Filled 2020-04-25 (×3): qty 1

## 2020-04-25 MED ORDER — PERFLUTREN LIPID MICROSPHERE
1.0000 mL | INTRAVENOUS | Status: AC | PRN
Start: 2020-04-25 — End: 2020-04-25
  Administered 2020-04-25: 5 mL via INTRAVENOUS
  Filled 2020-04-25: qty 10

## 2020-04-25 MED ORDER — HYDROMORPHONE HCL 1 MG/ML IJ SOLN
INTRAMUSCULAR | Status: AC
  Administered 2020-04-25: 1 mg
  Filled 2020-04-25: qty 1

## 2020-04-25 MED ORDER — PATIROMER SORBITEX CALCIUM 8.4 G PO PACK
8.4000 g | PACK | Freq: Every day | ORAL | Status: DC
  Administered 2020-04-25 – 2020-04-27 (×3): 8.4 g via ORAL
  Filled 2020-04-25 (×4): qty 1

## 2020-04-25 MED ORDER — ENOXAPARIN SODIUM 60 MG/0.6ML ~~LOC~~ SOLN
0.5000 mg/kg | SUBCUTANEOUS | Status: DC
  Administered 2020-04-25 – 2020-04-28 (×4): 50 mg via SUBCUTANEOUS
  Filled 2020-04-25 (×4): qty 0.6

## 2020-04-25 MED ORDER — SODIUM BICARBONATE 8.4 % IV SOLN: 100.0000 meq | Freq: Once | INTRAVENOUS | Status: AC

## 2020-04-25 MED ORDER — ALBUMIN HUMAN 25 % IV SOLN
12.5000 g | Freq: Once | INTRAVENOUS | Status: AC
  Administered 2020-04-25: 12.5 g via INTRAVENOUS
  Filled 2020-04-25: qty 50

## 2020-04-25 NOTE — Progress Notes (Signed)
*  PRELIMINARY RESULTS* Echocardiogram 2D Echocardiogram has been performed. Definity IV Contrast used on this study. Lori Mckenzie 04/25/2020, 11:36 AM

## 2020-04-25 NOTE — Progress Notes (Signed)
PHARMACIST - PHYSICIAN COMMUNICATION  CONCERNING:  Enoxaparin (Lovenox) for DVT Prophylaxis    RECOMMENDATION: Patient was prescribed enoxaparin 40 mg q24 hours for VTE prophylaxis.   Filed Weights   04/24/20 1801 04/25/20 0000  Weight: 83.9 kg (185 lb) 100 kg (220 lb 7.4 oz)    Body mass index is 40.32 kg/m.  Estimated Creatinine Clearance: 47.9 mL/min (A) (by C-G formula based on SCr of 1.45 mg/dL (H)).   Based on [place] patient is candidate for enoxaparin 0.73m/kg TBW SQ every 24 hours based on BMI >30.   DESCRIPTION: [facility].  Patient is now receiving enoxaparin 50 mg every 24 hours    ATawnya Crook PharmD Clinical Pharmacist  04/25/2020 7:22 AM

## 2020-04-25 NOTE — Consult Note (Signed)
Lori Mckenzie , MD [address] [place], [place], [place], [id number] [place], [place], [place], [address] Phone: [phone]  Fax: [phone]  Consultation  Referring Provider:     Dr Mortimer Fries Primary Care Physician:  Pcp, No Primary Gastroenterologist:  Dr. Howell Rucks : [facility]          Reason for Consultation:     Cirrhosis  Date of Admission:  04/24/2020 Date of Consultation:  04/25/2020         HPI:   Lori Mckenzie is a 55 y.o. 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  History of grade 1 esophageal varices, hepatic encephalopathy.  Previously followed at [facility] and most recently seen by Dr. Howell Rucks at [facility] on 02/10/2020.  mg.  For hepatic encephalopathy on lactulose and Xifaxan.  EGD on 02/12/2020 has undergone paracentesis large volume on 02/07/2020 with 7.7 L taken out.  She had been on Aldactone for 100 mg a day.  In January her meld score was 13.  She was set up as an outpatient for IV Lasix weekly of 80 mg unclear reason for IV Lasix.  Appears she underwent an endoscopy in February but I cannot see the result on epic or Care Everywhere.  CT scan of the abdomen pelvis without IV contrast on 03/10/2020 showed cirrhosis, TIPS moderate volume ascites unchanged from prior.  Splenomegaly.  Underwent ultrasound paracentesis on 03/13/2020 and 7.3 L of fluid was taken out.  No evidence of SBP.  She is apparently on home hospice had a fall while out shopping at [place] on 04/24/2020.  She was disorientated in the ER.  In the ER she was hypotensive, being treated for possible SBP.  Hyponatremia noted, hypokalemia suspected.  Admission complicated by AKI and hepatic encephalopathy.   On admission hemoglobin was 9.3 g with an MCV of 81, potassium was 6.1 and a creatinine of 1.89.  1 year back creatinine of 0.58.  This morning potassium was 5.0 and a creatinine of 1.45.   She has  an intra-abdominal catheter to drain the ascites.  She says she is not a transplant candidate.  She says for the last few days the color of the fluid in the tube has changed from clear to yellow.  She has developed severe right upper quadrant discomfort worse with movement nonradiating.  Has bruised the right side of her face after she had a fall.  Feels a bit better after coming into the hospital.  Past Medical History:  Diagnosis Date  . Abdominal abscess 08/25/2014  . Acid reflux 08/10/2010  . Acute cervical myofascial strain 02/09/2016  . Acute postoperative pain 08/09/2016  . Anxiety   . Ascites   . Asthma   . Back pain   . Bile leak, postoperative 07/17/2014  . Brittle bone disease   . Cancer ([place])    Uteriine  ca 87yr ago partial hysterectomy  . Cervical disc disease   . Chronic kidney disease   . Collagen vascular disease ([place]    RA  3-4 yrs ago  . COPD (chronic obstructive pulmonary disease) ([place]   . Diabetes mellitus without complication ([place]   . GERD (gastroesophageal reflux disease)   . Hypertension   . Hypothyroidism   . Left upper quadrant pain 01/09/2014  . Major depressive disorder with single episode 12/05/2011  . Major depressive disorder, single episode 12/05/2011  . Migraines   .  NASH (nonalcoholic steatohepatitis)   . Respiratory infection    2/17  . Shock ([place]) 09/18/2014  . Sleep apnea   . Sleep apnea   . Syncope 11/16/2014  . Thyroid disease   . TIA (transient ischemic attack)     Past Surgical History:  Procedure Laterality Date  . ABDOMINAL HYSTERECTOMY    . CHOLECYSTECTOMY N/A 07/15/2014   Procedure: LAPAROSCOPIC CHOLECYSTECTOMY with liver biopsy ;  Surgeon: Sherri Rad, MD;  Location: [facility] ORS;  Service: General;  Laterality: N/A;  . COLONOSCOPY WITH PROPOFOL N/A 06/23/2014   Procedure: COLONOSCOPY WITH PROPOFOL;  Surgeon: Lollie Sails, MD;  Location[facility] ENDOSCOPY;  Service: Endoscopy;  Laterality: N/A;  . ERCP N/A 07/16/2014   Procedure:  ENDOSCOPIC RETROGRADE CHOLANGIOPANCREATOGRAPHY (ERCP);  Surgeon: Clarene Essex, MD;  Location: [facility] ENDOSCOPY;  Service: Endoscopy;  Laterality: N/A;  . ERCP N/A 10/03/2014   Procedure: ENDOSCOPIC RETROGRADE CHOLANGIOPANCREATOGRAPHY (ERCP);  Surgeon: Hulen Luster, MD;  Location: [facility] ENDOSCOPY;  Service: Gastroenterology;  Laterality: N/A;  . ESOPHAGOGASTRODUODENOSCOPY N/A 06/23/2014   Procedure: ESOPHAGOGASTRODUODENOSCOPY (EGD);  Surgeon: Lollie Sails, MD;  Location: [facility] ENDOSCOPY;  Service: Endoscopy;  Laterality: N/A;  . ESOPHAGOGASTRODUODENOSCOPY N/A 06/30/2016   Procedure: ESOPHAGOGASTRODUODENOSCOPY (EGD);  Surgeon: Lori Bellows, MD;  Location: [facility] ENDOSCOPY;  Service: Endoscopy;  Laterality: N/A;  . ESOPHAGOGASTRODUODENOSCOPY (EGD) WITH PROPOFOL N/A 12/01/2015   Procedure: ESOPHAGOGASTRODUODENOSCOPY (EGD) WITH PROPOFOL;  Surgeon: Lollie Sails, MD;  Location: [facility] ENDOSCOPY;  Service: Endoscopy;  Laterality: N/A;  . TUBAL LIGATION    . WISDOM TOOTH EXTRACTION      Prior to Admission medications   Medication Sig Start Date End Date Taking? Authorizing Provider  albuterol (PROVENTIL HFA;VENTOLIN HFA) 108 (90 Base) MCG/ACT inhaler Inhale 2 puffs into the lungs every 6 (six) hours as needed for wheezing or shortness of breath.   Yes [provider]  albuterol (PROVENTIL) (2.5 MG/3ML) 0.083% nebulizer solution Take 2.5 mg by nebulization every 4 (four) hours as needed.   Yes [provider]  ALPRAZolam Duanne Moron) 0.5 MG tablet Take 0.5 mg by mouth every 4 (four) hours as needed. 04/23/20  Yes [provider]  citalopram (CELEXA) 10 MG tablet Take 10 mg by mouth daily. 11/27/18  Yes [provider]  diphenhydrAMINE (BENADRYL) 25 MG tablet Take 25 mg by mouth every 6 (six) hours as needed.   Yes [provider]  lactulose (CHRONULAC) 10 GM/15ML solution Take 30 mLs (20 g total) by mouth 3 (three) times daily. 10/26/16  Yes Gouru, Illene Silver, MD  morphine (MS  CONTIN) 15 MG 12 hr tablet Take 3 tablets by mouth every 12 (twelve) hours. 04/23/20  Yes [provider]  nystatin (MYCOSTATIN/NYSTOP) powder Apply 1 g topically 3 (three) times daily as needed. For irritation 10/26/16  Yes Gouru, Aruna, MD  ondansetron (ZOFRAN-ODT) 4 MG disintegrating tablet Take 4 mg by mouth every 4 (four) hours as needed for nausea. 03/18/20  Yes [provider]  Oxycodone HCl 10 MG TABS Take 10 mg by mouth every 3 (three) hours as needed.   Yes [provider]  rizatriptan (MAXALT) 5 MG tablet Take 5 mg by mouth daily as needed. 06/28/17  Yes [provider]  ALPRAZolam Duanne Moron) 1 MG tablet Take 1 mg by mouth 3 (three) times daily as needed.  Patient not taking: Reported on 04/25/2020    [provider]  cholecalciferol (VITAMIN D) 1000 units tablet Take 2,000 Units by mouth daily. Patient not taking: No sig reported  [provider]  diclofenac sodium (VOLTAREN) 1 % GEL Apply 4 g topically every 8 (eight) hours as needed. Patient not taking: Reported on 04/25/2020 12/06/16 08/20/18  Milinda Pointer, MD  dicyclomine (BENTYL) 10 MG capsule Take 10 mg by mouth 2 (two) times daily. Patient not taking: No sig reported 07/23/18   [provider]  fluticasone (FLONASE) 50 MCG/ACT nasal spray Place 1-2 sprays into both nostrils daily as needed for rhinitis.  Patient not taking: No sig reported    [provider]  Folate-B12-Intrinsic Factor (INTRINSI B12-FOLATE) [id number] MCG-MCG-MG TABS Take 1 tablet by mouth daily.  Patient not taking: No sig reported    [provider]  gabapentin (NEURONTIN) 300 MG capsule Take 900 mg by mouth 3 (three) times daily.  Patient not taking: No sig reported    [provider]  insulin regular human CONCENTRATED (HUMULIN R) 500 UNIT/ML injection Inject 100-200 Units into the skin 2 (two) times a day. Inject 200 units in the morning, inject 100 units nightly. Based  on glucose readings Patient not taking: No sig reported    [provider]  magnesium oxide (MAG-OX) 400 MG tablet Take 400 mg by mouth daily. Patient not taking: No sig reported    [provider]  pantoprazole (PROTONIX) 40 MG tablet Take 40 mg by mouth 2 (two) times daily before a meal. Patient not taking: No sig reported    [provider]  potassium chloride SA (K-DUR,KLOR-CON) 20 MEQ tablet Take 20 mEq by mouth daily as needed. For high blood pressure Patient not taking: No sig reported    [provider]  promethazine (PHENERGAN) 12.5 MG tablet Take 2 tablets (25 mg total) by mouth every 6 (six) hours as needed for nausea or vomiting. Patient not taking: No sig reported 09/25/16   Dustin Flock, MD  rifaximin (XIFAXAN) 550 MG TABS tablet Take 550 mg by mouth daily.  Patient not taking: No sig reported    [provider]  spironolactone (ALDACTONE) 100 MG tablet Take 400 mg by mouth daily.  Patient not taking: No sig reported    [provider]  vitamin B-12 (CYANOCOBALAMIN) 1000 MCG tablet Take 2,000 mcg by mouth daily. Patient not taking: No sig reported    [provider]  SUMAtriptan (IMITREX) 50 MG tablet Take 50 mg by mouth every 2 (two) hours as needed. For migraines  03/17/11  [provider]    Family History  Problem Relation Age of Onset  . Lung cancer Mother   . Ulcers Father   . Emphysema Father   . Heart disease Sister   . Ulcers Sister   . Heart disease Brother      Social History   Tobacco Use  . Smoking status: Never Smoker  . Smokeless tobacco: Never Used  Vaping Use  . Vaping Use: Never used  Substance Use Topics  . Alcohol use: No    Comment: occ  . Drug use: No    Allergies as of 04/24/2020 - Review Complete 04/24/2020  Allergen Reaction Noted  . Aspirin  04/24/2020  . Nsaids Other (See Comments) 02/12/2017  . Tape Swelling 10/03/2014  . Tylenol [acetaminophen]  04/24/2020   . Vicodin [hydrocodone-acetaminophen] Itching and Rash 01/07/2011    Review of Systems:    All systems reviewed and negative except where noted in HPI.   Physical Exam:  Vital signs in last 24 hours: Temp:  [97.6 F (36.4 C)-98.7 F (37.1 C)] 98.4 F (36.9  C) (04/23 0800) Pulse Rate:  [56-98] 91 (04/23 0900) Resp:  [12-29] 16 (04/23 0900) BP: (69-129)/(28-112) 89/67 (04/23 0900) SpO2:  [96 %-100 %] 100 % (04/23 0900) Weight:  [83.9 kg-100 kg] 100 kg (04/23 0000) Last BM Date:  (PTA) General:   Pleasant, cooperative in NAD Head:  Normocephalic , bruise over the right side of her face Eyes:   No icterus.   Conjunctiva pink. PERRLA. Ears:  Normal auditory acuity. Neck:  Supple; no masses or thyroidomegaly Lungs: Respirations even and unlabored. Lungs clear to auscultation bilaterally.   No wheezes, crackles, or rhonchi.  Heart:  Regular rate and rhythm;  Without murmur, clicks, rubs or gallops Abdomen:  Soft, moderately generalized distention.  Right upper quadrant tenderness around the site of the catheter.  Fluid in the catheter appears yellowish tinged.  No guarding or rigidity. Neurologic:  Alert and oriented x3;  grossly normal neurologically. Skin:  Intact without significant lesions or rashes. Cervical Nodes:  No significant cervical adenopathy. Psych:  Alert and cooperative. Normal affect.  LAB RESULTS: Recent Labs    04/24/20 1809 04/25/20 0522  WBC 6.0 5.0  HGB 9.3* 9.1*  HCT 28.8* 27.0*  PLT 116* 91*   BMET Recent Labs    04/24/20 1809 04/24/20 2236 04/24/20 2300 04/25/20 0522 04/25/20 0817  NA 122*   < > 122* 126* 126*  K 6.1*  --  6.7* 5.0  --   CL 97*  --   --  99  --   CO2 20*  --   --  22  --   GLUCOSE 100*  --   --  101*  --   BUN 46*  --   --  40*  --   CREATININE 1.89*  --   --  1.45*  --   CALCIUM 8.4*  --   --  8.1*  --    < > = values in this interval not displayed.   LFT Recent Labs    04/25/20 0522  PROT 4.6*  ALBUMIN 2.5*  AST  33  ALT 22  ALKPHOS 81  BILITOT 1.2   PT/INR Recent Labs    04/24/20 1809  LABPROT 15.9*  INR 1.3*    STUDIES: CT Head Wo Contrast  Result Date: 04/24/2020 CLINICAL DATA:  Bruising to right cheek from fall at [employer] EXAM: CT HEAD WITHOUT CONTRAST CT MAXILLOFACIAL WITHOUT CONTRAST CT CERVICAL SPINE WITHOUT CONTRAST TECHNIQUE: Multidetector CT imaging of the head, cervical spine, and maxillofacial structures were performed using the standard protocol without intravenous contrast. Multiplanar CT image reconstructions of the cervical spine and maxillofacial structures were also generated. COMPARISON:  CT 07/24/2018 FINDINGS: CT HEAD FINDINGS Brain: No evidence of acute infarction, hemorrhage, hydrocephalus, extra-axial collection or mass lesion/mass effect. Vascular: No hyperdense vessels.  No unexpected calcification Skull: Normal. Negative for fracture or focal lesion. Other: None CT MAXILLOFACIAL FINDINGS Osseous: Mandibular heads are normally position. Mastoid air cells are clear. No mandibular fracture. Pterygoid plates and zygomatic arches are intact. No acute nasal fracture Orbits: Negative. No traumatic or inflammatory finding. Sinuses: Clear. Soft tissues: Mild right premalar soft tissue swelling CT CERVICAL SPINE FINDINGS Alignment: Straightening of the cervical spine. No subluxation. Facet alignment within normal limits. Skull base and vertebrae: No acute fracture. No primary bone lesion or focal pathologic process. Soft tissues and spinal canal: No prevertebral fluid or swelling. No visible canal hematoma. Disc levels: Multiple level degenerative change with moderate disc space narrowing and degenerative change at C4-C5, C5-C6 and  C6-C7. Posterior disc osteophyte complex at C4-C5 and C6-C7. Facet degenerative changes at multiple levels. Moderate bilateral foraminal narrowing C4-C5. Upper chest: Negative. Other: None IMPRESSION: 1. Negative non contrasted CT appearance of the brain. 2. Mild  right premalar soft tissue swelling. No acute facial bone fracture. 3. Straightening of the cervical spine with degenerative changes. No acute osseous abnormality. Electronically Signed   By: Donavan Foil M.D.   On: 04/24/2020 21:30   CT Cervical Spine Wo Contrast  Result Date: 04/24/2020 CLINICAL DATA:  Bruising to right cheek from fall at [employer] EXAM: CT HEAD WITHOUT CONTRAST CT MAXILLOFACIAL WITHOUT CONTRAST CT CERVICAL SPINE WITHOUT CONTRAST TECHNIQUE: Multidetector CT imaging of the head, cervical spine, and maxillofacial structures were performed using the standard protocol without intravenous contrast. Multiplanar CT image reconstructions of the cervical spine and maxillofacial structures were also generated. COMPARISON:  CT 07/24/2018 FINDINGS: CT HEAD FINDINGS Brain: No evidence of acute infarction, hemorrhage, hydrocephalus, extra-axial collection or mass lesion/mass effect. Vascular: No hyperdense vessels.  No unexpected calcification Skull: Normal. Negative for fracture or focal lesion. Other: None CT MAXILLOFACIAL FINDINGS Osseous: Mandibular heads are normally position. Mastoid air cells are clear. No mandibular fracture. Pterygoid plates and zygomatic arches are intact. No acute nasal fracture Orbits: Negative. No traumatic or inflammatory finding. Sinuses: Clear. Soft tissues: Mild right premalar soft tissue swelling CT CERVICAL SPINE FINDINGS Alignment: Straightening of the cervical spine. No subluxation. Facet alignment within normal limits. Skull base and vertebrae: No acute fracture. No primary bone lesion or focal pathologic process. Soft tissues and spinal canal: No prevertebral fluid or swelling. No visible canal hematoma. Disc levels: Multiple level degenerative change with moderate disc space narrowing and degenerative change at C4-C5, C5-C6 and C6-C7. Posterior disc osteophyte complex at C4-C5 and C6-C7. Facet degenerative changes at multiple levels. Moderate bilateral foraminal  narrowing C4-C5. Upper chest: Negative. Other: None IMPRESSION: 1. Negative non contrasted CT appearance of the brain. 2. Mild right premalar soft tissue swelling. No acute facial bone fracture. 3. Straightening of the cervical spine with degenerative changes. No acute osseous abnormality. Electronically Signed   By: Donavan Foil M.D.   On: 04/24/2020 21:30   DG Chest Port 1 View  Result Date: 04/24/2020 CLINICAL DATA:  Fall EXAM: PORTABLE CHEST 1 VIEW COMPARISON:  February 14, 2020 FINDINGS: The heart size and mediastinal contours are within normal limits. Both lungs are clear. The visualized skeletal structures are unremarkable. IMPRESSION: No active disease. Electronically Signed   By: Dahlia Bailiff MD   On: 04/24/2020 18:58   CT CHEST ABDOMEN PELVIS WO CONTRAST  Result Date: 04/24/2020 CLINICAL DATA:  Abdominal/chest trauma, minor EXAM: CT CHEST, ABDOMEN AND PELVIS WITHOUT CONTRAST TECHNIQUE: Multidetector CT imaging of the chest, abdomen and pelvis was performed following the standard protocol without IV contrast. COMPARISON:  CT abdomen pelvis February 14, 2020 50 which FINDINGS: CT CHEST FINDINGS Cardiovascular: Mild aortic atherosclerosis. No thoracic aortic aneurysm. Normal size heart. No significant pericardial effusion/thickening. Mediastinum/Nodes: No discrete thyroid nodule. No pathologically enlarged mediastinal or axillary lymph nodes. Trachea and esophagus are grossly unremarkable. Lungs/Pleura: Lungs are clear. No pleural effusion or pneumothorax. Musculoskeletal: No acute osseous abnormality CT ABDOMEN PELVIS FINDINGS Hepatobiliary: Stable appearing cirrhotic hepatic changes evidence of prior tips shunt. Stable cystic area in the hepatic dome and along the falciform ligament. No worrisome hepatic lesions on noncontrast exam. Gallbladder surgically absent. No biliary ductal dilation Pancreas: Within normal limits. Spleen: Stable splenomegaly. Adrenals/Urinary Tract: Adrenal glands are  unremarkable. Kidneys are normal, without  renal calculi, focal lesion, or hydronephrosis. Bladder is unremarkable. Stomach/Bowel: Stomach is grossly unremarkable. No suspicious small bowel dilation. Mild wall thickening involving the ascending and transverse colon likely portal colopathy. Vascular/Lymphatic: Aortic atherosclerosis without aneurysmal dilation stable prominent upper abdominal lymph nodes typical sequela of cirrhosis. No retroperitoneal mass or adenopathy. Reproductive: Status post hysterectomy. No adnexal masses. Other: Interval placement of a right anterior peritoneal catheter with tip in the pelvis. Slightly decreased moderate volume ascites. Similar postsurgical changes along the left anterior abdominal wall. Musculoskeletal: Multilevel degenerative changes spine. No acute osseous abnormality. IMPRESSION: 1. No noncontrast CT evidence of acute traumatic injury within the chest abdomen or pelvis. 2. Prior tip shunt placement with stable cirrhotic hepatic morphology with sequela of portal hypertension including, stable splenomegaly, mild ascending portal colopathy, and decreased now moderate volume of ascites status post peritoneal catheter placement. 3. Aortic atherosclerosis. Aortic Atherosclerosis (ICD10-I70.0). Electronically Signed   By: Dahlia Bailiff MD   On: 04/24/2020 21:23   CT Maxillofacial Wo Contrast  Result Date: 04/24/2020 CLINICAL DATA:  Bruising to right cheek from fall at [employer] EXAM: CT HEAD WITHOUT CONTRAST CT MAXILLOFACIAL WITHOUT CONTRAST CT CERVICAL SPINE WITHOUT CONTRAST TECHNIQUE: Multidetector CT imaging of the head, cervical spine, and maxillofacial structures were performed using the standard protocol without intravenous contrast. Multiplanar CT image reconstructions of the cervical spine and maxillofacial structures were also generated. COMPARISON:  CT 07/24/2018 FINDINGS: CT HEAD FINDINGS Brain: No evidence of acute infarction, hemorrhage, hydrocephalus,  extra-axial collection or mass lesion/mass effect. Vascular: No hyperdense vessels.  No unexpected calcification Skull: Normal. Negative for fracture or focal lesion. Other: None CT MAXILLOFACIAL FINDINGS Osseous: Mandibular heads are normally position. Mastoid air cells are clear. No mandibular fracture. Pterygoid plates and zygomatic arches are intact. No acute nasal fracture Orbits: Negative. No traumatic or inflammatory finding. Sinuses: Clear. Soft tissues: Mild right premalar soft tissue swelling CT CERVICAL SPINE FINDINGS Alignment: Straightening of the cervical spine. No subluxation. Facet alignment within normal limits. Skull base and vertebrae: No acute fracture. No primary bone lesion or focal pathologic process. Soft tissues and spinal canal: No prevertebral fluid or swelling. No visible canal hematoma. Disc levels: Multiple level degenerative change with moderate disc space narrowing and degenerative change at C4-C5, C5-C6 and C6-C7. Posterior disc osteophyte complex at C4-C5 and C6-C7. Facet degenerative changes at multiple levels. Moderate bilateral foraminal narrowing C4-C5. Upper chest: Negative. Other: None IMPRESSION: 1. Negative non contrasted CT appearance of the brain. 2. Mild right premalar soft tissue swelling. No acute facial bone fracture. 3. Straightening of the cervical spine with degenerative changes. No acute osseous abnormality. Electronically Signed   By: Donavan Foil M.D.   On: 04/24/2020 21:30      Impression / Plan:   Chalyn Amescua is a 54 y.o. y/o female with a history of decompensated chronic liver disease secondary to [name] cirrhosis usually followed at [facility] care, status post TIPS which seems to have failed as she has developed ascites requiring large-volume paracentesis on multiple occasions, as an outpatient on maximum dose of diuretics.  Admitted after a fall found to be in shock likely due to sepsis which is being treated for, hepatic encephalopathy, AKI.  It is  possible that she has developed sepsis leading to AKI which could be prerenal or hepatorenal syndrome or a combination.  Source of the sepsis is possible peritonitis due to presence of the intra-abdominal catheter which is associated with a very high risk of peritonitis.  CT head shows no acute abnormality, CT  chest abdomen pelvis shows no acute process within the abdomen moderate volume of ascites.  Chest x-ray shows Plan 1.  Treat as SBP with possible hepatorenal syndrome. 2.  Suggest giving IV albumin, midodrine and octreotide monitoring creatinine after hepatorenal syndrome protocol. 3.  With hyperkalemia hold off Aldactone. 4.  Diagnostic paracentesis to rule out secondary SBP or primary continuous bacterial peritonitis.  If there is presence of peritonitis may need to take out the infected catheter.  Perform as needed large-volume paracentesis 5.  Goals of care discussion  6.  Avoid excess sodium, use IV albumin for volume expansion or if possible 5% dextrose 7.  Lactulose and Xifaxan for hepatic encephalopathy DC Colace has no role nor does any MiraLAX in her setting 8.  Normalize electrolytes, avoid narcotics and benzodiazepines if possible and treatment of sepsis, hypervolemia will help improve hepatic encephalopathy. 9.  If renal functions do not improve with fluid resuscitation and volume expansion, it could indicate hepatorenal syndrome.  This indicates a significantly higher mortality, her baseline mortality by itself is extremely high with her existing comorbidities.  I have personally discussed the plan with Dr. Mortimer Fries.  I will sign out, if there are any further questions please do page me   Thank you for involving me in the care of this patient.      LOS: 1 day   Lori Bellows, MD  04/25/2020, 9:27 AM

## 2020-04-25 NOTE — Progress Notes (Signed)
Obtained Pleurx catheter drain kit from OR, and drained ascites completely after discussion with Dr. Vicente Males. - Fluid sample sent to lab - total output 3,100 mL > per Dr. Vicente Males only give additional albumin if > 5 L - no complications, patient alert and oriented, states "she feels fine"  Also discussed with Dr. Vicente Males acute on chronic hyponatremia, most recent Na+ continues to trend down at 122. Patient remains alert and oriented. Dr. Vicente Males recommended not providing any sodium replacement unless patient became symptomatic as this is indicative of her underlying cirrhosis.  Care RN, [place] aware of the plan of care.   Domingo Pulse [place], AGACNP-BC Acute Care Nurse Practitioner [facility]   [phone] / [phone] Please see Amion for pager details.

## 2020-04-25 NOTE — Progress Notes (Incomplete)
   04/25/20 1620  Clinical Encounter Type  Visited With Patient  Visit Type Initial;Spiritual support;Social support  Referral From Nurse  Consult/Referral To Chaplain  Spiritual Encounters  Spiritual Needs Emotional;Prayer  Stress Factors  Patient Stress Factors Loss of control;Health changes;Exhausted   Chaplain responded to a page from the nurse. The PT requested to have an AD completed. Chaplain did AD education. The PT will fill out the booklet, and have it notarized on Monday. The PT was able to express the deepest sorrows she had in her life. She spoke of great sadness that she lost both of he children, and she feels so drained by the cares of life. Chaplain ministered with presence, reflective listening, and prayer.

## 2020-04-25 NOTE — Psychosocial Assessment (Incomplete)
2000: 3.1 L removed from perlux cath pt remained stable

## 2020-04-26 DIAGNOSIS — K7031 Alcoholic cirrhosis of liver with ascites: Secondary | ICD-10-CM

## 2020-04-26 DIAGNOSIS — R55 Syncope and collapse: Secondary | ICD-10-CM | POA: Diagnosis not present

## 2020-04-26 DIAGNOSIS — E875 Hyperkalemia: Secondary | ICD-10-CM | POA: Diagnosis not present

## 2020-04-26 DIAGNOSIS — R6521 Severe sepsis with septic shock: Secondary | ICD-10-CM

## 2020-04-26 DIAGNOSIS — A419 Sepsis, unspecified organism: Secondary | ICD-10-CM | POA: Diagnosis not present

## 2020-04-26 LAB — CBC WITH DIFFERENTIAL/PLATELET
Abs Immature Granulocytes: 0.02 10*3/uL (ref 0.00–0.07)
Basophils Absolute: 0 10*3/uL (ref 0.0–0.1)
Basophils Relative: 1 %
Eosinophils Absolute: 0.2 10*3/uL (ref 0.0–0.5)
Eosinophils Relative: 6 %
HCT: 26.3 % — ABNORMAL LOW (ref 36.0–46.0)
Hemoglobin: 9 g/dL — ABNORMAL LOW (ref 12.0–15.0)
Immature Granulocytes: 1 %
Lymphocytes Relative: 10 %
Lymphs Abs: 0.3 10*3/uL — ABNORMAL LOW (ref 0.7–4.0)
MCH: 27.1 pg (ref 26.0–34.0)
MCHC: 34.2 g/dL (ref 30.0–36.0)
MCV: 79.2 fL — ABNORMAL LOW (ref 80.0–100.0)
Monocytes Absolute: 0.4 10*3/uL (ref 0.1–1.0)
Monocytes Relative: 12 %
Neutro Abs: 2.1 10*3/uL (ref 1.7–7.7)
Neutrophils Relative %: 70 %
Platelets: 98 10*3/uL — ABNORMAL LOW (ref 150–400)
RBC: 3.32 MIL/uL — ABNORMAL LOW (ref 3.87–5.11)
RDW: 19.5 % — ABNORMAL HIGH (ref 11.5–15.5)
WBC: 3 10*3/uL — ABNORMAL LOW (ref 4.0–10.5)
nRBC: 0 % (ref 0.0–0.2)

## 2020-04-26 LAB — GLUCOSE, PLEURAL OR PERITONEAL FLUID: Glucose, Fluid: 157 mg/dL

## 2020-04-26 LAB — URINALYSIS, COMPLETE (UACMP) WITH MICROSCOPIC
Bacteria, UA: NONE SEEN
Bilirubin Urine: NEGATIVE
Glucose, UA: NEGATIVE mg/dL
Hgb urine dipstick: NEGATIVE
Ketones, ur: NEGATIVE mg/dL
Leukocytes,Ua: NEGATIVE
Nitrite: NEGATIVE
Protein, ur: NEGATIVE mg/dL
Specific Gravity, Urine: 1.015 (ref 1.005–1.030)
pH: 6 (ref 5.0–8.0)

## 2020-04-26 LAB — SODIUM, URINE, RANDOM: Sodium, Ur: <10 mmol/L

## 2020-04-26 LAB — PROCALCITONIN: Procalcitonin: <0.1 ng/mL

## 2020-04-26 LAB — ALBUMIN, PLEURAL OR PERITONEAL FLUID: Albumin, Fluid: <1 g/dL

## 2020-04-26 LAB — PROTEIN, PLEURAL OR PERITONEAL FLUID: Total protein, fluid: <3 g/dL

## 2020-04-26 LAB — OSMOLALITY, URINE: Osmolality, Ur: 435 mOsm/kg (ref 300–900)

## 2020-04-26 LAB — COMPREHENSIVE METABOLIC PANEL
ALT: 23 U/L (ref 0–44)
AST: 36 U/L (ref 15–41)
Albumin: 2.8 g/dL — ABNORMAL LOW (ref 3.5–5.0)
Alkaline Phosphatase: 69 U/L (ref 38–126)
Anion gap: 8 (ref 5–15)
BUN: 30 mg/dL — ABNORMAL HIGH (ref 6–20)
CO2: 18 mmol/L — ABNORMAL LOW (ref 22–32)
Calcium: 8.1 mg/dL — ABNORMAL LOW (ref 8.9–10.3)
Chloride: 97 mmol/L — ABNORMAL LOW (ref 98–111)
Creatinine, Ser: 1.27 mg/dL — ABNORMAL HIGH (ref 0.44–1.00)
GFR, Estimated: 50 mL/min — ABNORMAL LOW (ref >60)
Glucose, Bld: 147 mg/dL — ABNORMAL HIGH (ref 70–99)
Potassium: 4.9 mmol/L (ref 3.5–5.1)
Sodium: 123 mmol/L — ABNORMAL LOW (ref 135–145)
Total Bilirubin: 1 mg/dL (ref 0.3–1.2)
Total Protein: 4.9 g/dL — ABNORMAL LOW (ref 6.5–8.1)

## 2020-04-26 LAB — SODIUM
Sodium: 120 mmol/L — ABNORMAL LOW (ref 135–145)
Sodium: 121 mmol/L — ABNORMAL LOW (ref 135–145)
Sodium: 122 mmol/L — ABNORMAL LOW (ref 135–145)
Sodium: 123 mmol/L — ABNORMAL LOW (ref 135–145)
Sodium: 123 mmol/L — ABNORMAL LOW (ref 135–145)
Sodium: 124 mmol/L — ABNORMAL LOW (ref 135–145)

## 2020-04-26 LAB — PHOSPHORUS: Phosphorus: 3.2 mg/dL (ref 2.5–4.6)

## 2020-04-26 LAB — GLUCOSE, CAPILLARY
Glucose-Capillary: 119 mg/dL — ABNORMAL HIGH (ref 70–99)
Glucose-Capillary: 126 mg/dL — ABNORMAL HIGH (ref 70–99)
Glucose-Capillary: 129 mg/dL — ABNORMAL HIGH (ref 70–99)
Glucose-Capillary: 156 mg/dL — ABNORMAL HIGH (ref 70–99)
Glucose-Capillary: 170 mg/dL — ABNORMAL HIGH (ref 70–99)

## 2020-04-26 LAB — LACTATE DEHYDROGENASE, PLEURAL OR PERITONEAL FLUID: LD, Fluid: 20 U/L (ref 3–23)

## 2020-04-26 LAB — MAGNESIUM: Magnesium: 2.1 mg/dL (ref 1.7–2.4)

## 2020-04-26 MED ORDER — ALBUMIN HUMAN 25 % IV SOLN
25.0000 g | Freq: Once | INTRAVENOUS | Status: AC
  Administered 2020-04-26: 25 g via INTRAVENOUS
  Filled 2020-04-26: qty 100

## 2020-04-26 MED ORDER — ALPRAZOLAM 0.5 MG PO TABS
0.5000 mg | ORAL_TABLET | ORAL | Status: DC | PRN
  Administered 2020-04-26 – 2020-05-01 (×6): 0.5 mg via ORAL
  Filled 2020-04-26 (×6): qty 1

## 2020-04-26 MED ORDER — OXYCODONE HCL 5 MG PO TABS
10.0000 mg | ORAL_TABLET | ORAL | Status: DC | PRN
  Administered 2020-04-26 – 2020-04-30 (×3): 10 mg via ORAL
  Filled 2020-04-26 (×3): qty 2

## 2020-04-26 MED ORDER — MORPHINE SULFATE ER 15 MG PO TBCR
45.0000 mg | EXTENDED_RELEASE_TABLET | Freq: Two times a day (BID) | ORAL | Status: DC
  Administered 2020-04-26 – 2020-05-01 (×10): 45 mg via ORAL
  Filled 2020-04-26 (×6): qty 3
  Filled 2020-04-26: qty 1
  Filled 2020-04-26 (×2): qty 3
  Filled 2020-04-26 (×2): qty 1

## 2020-04-26 NOTE — Progress Notes (Addendum)
NAME:  Lori Mckenzie, MRN:  [id number], DOB:  02-15-1965, LOS: 2 ADMISSION DATE:  04/24/2020, INITIAL CONSULTATION DATE: 04/24/20 REFERRING MD:  Dr. Ellender Hose, CHIEF COMPLAINT: Fall   Brief Patient Description  55 year old female with extensive history as below presenting to the ED with syncope and collapse found to be hypotensive, severely hypovolemic with suspected SBP and in AKI.  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 Hypertension T2DM COPD OSA -not on home oxygen Hypothyroidism Uterine cancer s/p partial hysterectomy CKD  Significant Hospital Events: Including procedures, antibiotic start and stop dates in addition to other pertinent events    04/24/20: Admit to ICU on vasopressor 4/23: GI consulted  Cultures:  4/22: SARS-CoV-2 PCR>> negative 4/22: Influenza PCR>> negative 4/22: Blood culture x2>>No growth thus far   Antimicrobials:  Ceftriaxone 4/22>>  Interim History / Subjective:  Mental status slightly improving  OBJECTIVE   Blood pressure (!) 90/43, pulse 70, temperature 98 F (36.7 C), temperature source Oral, resp. rate 12, height 5' 2"  (1.575 m), weight 100 kg, SpO2 100 %.        Intake/Output Summary (Last 24 hours) at 04/26/2020 0929 Last data filed at 04/26/2020 0827 Gross per 24 hour  Intake 1562.87 ml  Output 3625 ml  Net -2062.13 ml   Filed Weights   04/24/20 1801 04/25/20 0000  Weight: 83.9 kg 100 kg    Examination: GENERAL:55 year-old patient lying in the bed with no acute distress.  EYES: Pupils equal, round, reactive to light and accommodation. No scleral icterus. Extraocular muscles intact.  HEENT: Head atraumatic, normocephalic. Oropharynx and nasopharynx clear.  NECK:  Supple, no jugular venous distention. No thyroid enlargement, no tenderness.  LUNGS: Normal breath sounds bilaterally, no wheezing, rales,rhonchi or crepitation. No use of accessory muscles of respiration.  CARDIOVASCULAR: S1, S2  normal. No murmurs, rubs, or gallops.  ABDOMEN: Soft, nontender, slightly distended. Bowel sounds present. No organomegaly or mass. Pleurx catheter  in place EXTREMITIES: 1-2+ pedal edema, cyanosis, or clubbing.  NEUROLOGIC: Cranial nerves II through XII are intact. Muscle strength 5/5 in all extremities. Sensation intact. Gait not checked.  PSYCHIATRIC: The patient is alert and oriented x 3.  SKIN:Open abrasion on right cheek with bruising, scattered ecchymosis bilateral upper arms    Labs/imaging that I havepersonally reviewed  (right click and "Reselect all SmartList Selections" daily)   04/24/20 CT maxillofacial wo contrast >> mild right premolar soft tissue swelling, no acute facial bone fracture 04/24/2020 CT cervical spine without contrast >> straightening of the cervical spine with degenerative changes no acute osseous abnormality 04/24/2020 CT head/abdomen/pelvis without contrast >> no acute intracranial abnormality. Prior TIPS shunt placement with stable cirrhotic hepatic morphology with sequela of portal hypertension including stable splenomegaly, mild a sending portal colopathy and decreased moderate volume ascites status post peritoneal catheter placement. No acute trauma noted CXR 04/24/2020: No active disease  Labs   CBC: Recent Labs  Lab 04/24/20 1809 04/25/20 0522 04/26/20 0513  WBC 6.0 5.0 3.0*  NEUTROABS  --   --  2.1  HGB 9.3* 9.1* 9.0*  HCT 28.8* 27.0* 26.3*  MCV 81.8 78.5* 79.2*  PLT 116* 91* 98*    Basic Metabolic Panel: Recent Labs  Lab 04/24/20 1809 04/24/20 2236 04/24/20 2300 04/25/20 0522 04/25/20 0817 04/25/20 1543 04/25/20 1952 04/25/20 2345 04/26/20 0513 04/26/20 0813  NA 122* 121* 122* 126*   < > 124* 122* 122* 123*  123* 123*  K 6.1*  --  6.7* 5.0  --   --   --   --  4.9  --   CL 97*  --   --  99  --   --   --   --  97*  --   CO2 20*  --   --  22  --   --   --   --  18*  --   GLUCOSE 100*  --   --  101*  --   --   --   --  147*  --   BUN  46*  --   --  40*  --   --   --   --  30*  --   CREATININE 1.89*  --   --  1.45*  --   --   --   --  1.27*  --   CALCIUM 8.4*  --   --  8.1*  --   --   --   --  8.1*  --   MG  --  2.3  --  2.2  --   --   --   --  2.1  --   PHOS  --  4.1  --  3.3  --   --   --   --  3.2  --    < > = values in this interval not displayed.   GFR: Estimated Creatinine Clearance: 54.7 mL/min (A) (by C-G formula based on SCr of 1.27 mg/dL (H)). Recent Labs  Lab 04/24/20 1809 04/24/20 1840 04/24/20 2019 04/24/20 2313 04/25/20 0522 04/26/20 0513  PROCALCITON  --   --   --  <0.10 <0.10 <0.10  WBC 6.0  --   --   --  5.0 3.0*  LATICACIDVEN  --  1.4 1.8  --   --   --     Liver Function Tests: Recent Labs  Lab 04/24/20 1809 04/25/20 0522 04/26/20 0513  AST 40 33 36  ALT 27 22 23   ALKPHOS 96 81 69  BILITOT 1.2 1.2 1.0  PROT 4.8* 4.6* 4.9*  ALBUMIN 2.5* 2.5* 2.8*   No results for input(s): LIPASE, AMYLASE in the last 168 hours. Recent Labs  Lab 04/24/20 2251  AMMONIA 37*    ABG    Component Value Date/Time   PHART 7.29 (L) 10/21/2016 1720   PCO2ART 65 (H) 10/21/2016 1720   PO2ART 69 (L) 10/21/2016 1720   HCO3 31.3 (H) 10/21/2016 1720   O2SAT 91.3 10/21/2016 1720     Coagulation Profile: Recent Labs  Lab 04/24/20 1809  INR 1.3*    Cardiac Enzymes: No results for input(s): CKTOTAL, CKMB, CKMBINDEX, TROPONINI in the last 168 hours.  HbA1C: Hemoglobin A1C  Date/Time Value Ref Range Status  11/17/2013 04:16 AM 8.0 (H) 4.2 - 6.3 % Final    Comment:    The American Diabetes Association recommends that a primary goal of therapy should be <7% and that physicians should reevaluate the treatment regimen in patients with HbA1c values consistently >8%.   04/26/2013 04:39 PM 6.3 4.2 - 6.3 % Final    Comment:    The American Diabetes Association recommends that a primary goal of therapy should be <7% and that physicians should reevaluate the treatment regimen in patients with HbA1c values  consistently >8%.    Hgb A1c MFr Bld  Date/Time Value Ref Range Status  04/25/2020 05:22 AM 5.9 (H) 4.8 - 5.6 % Final    Comment:    (NOTE) Pre diabetes:          5.7%-6.4%  Diabetes:              >  6.4%  Glycemic control for   <7.0% adults with diabetes   06/12/2017 06:17 AM 9.0 (H) 4.8 - 5.6 % Final    Comment:    (NOTE) Pre diabetes:          5.7%-6.4% Diabetes:              >6.4% Glycemic control for   <7.0% adults with diabetes     CBG: Recent Labs  Lab 04/25/20 1533 04/25/20 2007 04/25/20 2339 04/26/20 0357 04/26/20 0808  GLUCAP 145* 124* 133* 170* 156*    Allergies Allergies  Allergen Reactions  . Aspirin     unknown  . Nsaids Other (See Comments)    Liver problems  . Tape Swelling  . Tylenol [Acetaminophen]     Not specified  . Vicodin [Hydrocodone-Acetaminophen] Itching and Rash     Home Medications  Prior to Admission medications   Medication Sig Start Date End Date Taking? Authorizing Provider  albuterol (PROVENTIL HFA;VENTOLIN HFA) 108 (90 Base) MCG/ACT inhaler Inhale 2 puffs into the lungs every 6 (six) hours as needed for wheezing or shortness of breath.   Yes [provider]  albuterol (PROVENTIL) (2.5 MG/3ML) 0.083% nebulizer solution Take 2.5 mg by nebulization every 4 (four) hours as needed.   Yes [provider]  ALPRAZolam Duanne Moron) 0.5 MG tablet Take 0.5 mg by mouth every 4 (four) hours as needed. 04/23/20  Yes [provider]  citalopram (CELEXA) 10 MG tablet Take 10 mg by mouth daily. 11/27/18  Yes [provider]  diphenhydrAMINE (BENADRYL) 25 MG tablet Take 25 mg by mouth every 6 (six) hours as needed.   Yes [provider]  lactulose (CHRONULAC) 10 GM/15ML solution Take 30 mLs (20 g total) by mouth 3 (three) times daily. 10/26/16  Yes Gouru, Illene Silver, MD  morphine (MS CONTIN) 15 MG 12 hr tablet Take 3 tablets by mouth every 12 (twelve) hours. 04/23/20  Yes [provider]  nystatin  (MYCOSTATIN/NYSTOP) powder Apply 1 g topically 3 (three) times daily as needed. For irritation 10/26/16  Yes Gouru, Aruna, MD  ondansetron (ZOFRAN-ODT) 4 MG disintegrating tablet Take 4 mg by mouth every 4 (four) hours as needed for nausea. 03/18/20  Yes [provider]  Oxycodone HCl 10 MG TABS Take 10 mg by mouth every 3 (three) hours as needed.   Yes [provider]  rizatriptan (MAXALT) 5 MG tablet Take 5 mg by mouth daily as needed. 06/28/17  Yes [provider]  ALPRAZolam Duanne Moron) 1 MG tablet Take 1 mg by mouth 3 (three) times daily as needed.  Patient not taking: Reported on 04/25/2020    [provider]  cholecalciferol (VITAMIN D) 1000 units tablet Take 2,000 Units by mouth daily. Patient not taking: No sig reported    [provider]  diclofenac sodium (VOLTAREN) 1 % GEL Apply 4 g topically every 8 (eight) hours as needed. Patient not taking: Reported on 04/25/2020 12/06/16 08/20/18  Milinda Pointer, MD  dicyclomine (BENTYL) 10 MG capsule Take 10 mg by mouth 2 (two) times daily. Patient not taking: No sig reported 07/23/18   [provider]  fluticasone (FLONASE) 50 MCG/ACT nasal spray Place 1-2 sprays into both nostrils daily as needed for rhinitis.  Patient not taking: No sig reported    [provider]  Folate-B12-Intrinsic Factor (INTRINSI B12-FOLATE) [id number] MCG-MCG-MG TABS Take 1 tablet by mouth daily.  Patient not taking: No sig reported    [provider]  gabapentin (NEURONTIN) 300 MG  capsule Take 900 mg by mouth 3 (three) times daily.  Patient not taking: No sig reported    [provider]  insulin regular human CONCENTRATED (HUMULIN R) 500 UNIT/ML injection Inject 100-200 Units into the skin 2 (two) times a day. Inject 200 units in the morning, inject 100 units nightly. Based on glucose readings Patient not taking: No sig reported    [provider]  magnesium oxide (MAG-OX) 400 MG tablet  Take 400 mg by mouth daily. Patient not taking: No sig reported    [provider]  pantoprazole (PROTONIX) 40 MG tablet Take 40 mg by mouth 2 (two) times daily before a meal. Patient not taking: No sig reported    [provider]  potassium chloride SA (K-DUR,KLOR-CON) 20 MEQ tablet Take 20 mEq by mouth daily as needed. For high blood pressure Patient not taking: No sig reported    [provider]  promethazine (PHENERGAN) 12.5 MG tablet Take 2 tablets (25 mg total) by mouth every 6 (six) hours as needed for nausea or vomiting. Patient not taking: No sig reported 09/25/16   Dustin Flock, MD  rifaximin (XIFAXAN) 550 MG TABS tablet Take 550 mg by mouth daily.  Patient not taking: No sig reported    [provider]  spironolactone (ALDACTONE) 100 MG tablet Take 400 mg by mouth daily.  Patient not taking: No sig reported    [provider]  vitamin B-12 (CYANOCOBALAMIN) 1000 MCG tablet Take 2,000 mcg by mouth daily. Patient not taking: No sig reported    [provider]  SUMAtriptan (IMITREX) 50 MG tablet Take 50 mg by mouth every 2 (two) hours as needed. For migraines  03/17/11  [provider]   Scheduled Meds: . Chlorhexidine Gluconate Cloth  6 each Topical Daily  . enoxaparin (LOVENOX) injection  0.5 mg/kg Subcutaneous Q24H  . fentaNYL  1 patch Transdermal Q72H  .  HYDROmorphone (DILAUDID) injection  1 mg Intravenous Once  . insulin aspart  0-9 Units Subcutaneous Q4H  . lactulose  10 g Oral BID  . midodrine  10 mg Oral TID WC  . octreotide  100 mcg Subcutaneous TID  . pantoprazole (PROTONIX) IV  40 mg Intravenous QHS  . patiromer  8.4 g Oral Daily  . rifaximin  200 mg Oral BID   Continuous Infusions: . cefTRIAXone (ROCEPHIN)  IV Stopped (04/26/20 0057)  . norepinephrine (LEVOPHED) Adult infusion 9 mcg/min (04/26/20 0827)   PRN Meds:.docusate sodium, ipratropium-albuterol, LORazepam, morphine injection,  oxyCODONE  Resolved Hospital Problem list   Hypokalemia  ASSESSMENT & PLAN   Syncope and collapse Hypotension due suspected hypovolemia in the setting of malnutrition Hypoalbuminemia Patient received 2 L NS and albumin in ED, ultimately requiring levophed drip - continue levophed, wean as tolerated to maintain MAP > 65 - Start Midodrine while weaning Levo  - continuous cardiac monitoring - echocardiogram pending   Suspected Spontaneous Bacterial Peritonitis PMHx: 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 Patient received empiric ABX in ED: flagyl, cefepime, vancomycin. PleurX catheter placed 03/19/20. Sister reports RN came and drained some fluid earlier on 04/24/20. - Screening for (infectious) trigger: UA, Blood Cx, paracentesis, r/o dehydration / overdiuresis - s/p diagnostic Paracentesis with total of 3100 ml output - Fluid sample sent to evaluate for secondary SBP or primary continuous bacterial peritonitis per GI recommendations - Continue ceftriaxone pending cultures # Hepatorenal?  - Wean off levo while on Midodrine 10 mg PO  tid - Octreotide 248mg SubQ tid - Consider albumin, goal increase MAP by 168mg -GI input appreciated   # Hepatic Encephalopathy Chronic pain syndrome followed by outpatient hospice PMHx: PTSD, depression and anxiety - Lactulose titrated to 3-4BM/day, - Rifaximin 55024mO bid - supportive care - avoid sedating medications as tolerated - Emphasize sleep/wake cycle - Pain management - palliative care consulted, appreciate input   Acute Kidney Injury secondary to suspected hypovolemia in the setting of malnutrition Hyperkalemia- 6.1 on admission now improved to 4.9 Baseline Cr: 0.6, Cr on admission: 1.89 Patient received insulin IV/ D 50 & Ca+ in ED - Strict I/O's: alert provider if UOP < 0.5 mL/kg/hr - gentle IVF hydration  - Daily BMP, replace electrolytes PRN -  Avoid nephrotoxic agents as able, ensure adequate renal perfusion   Acute on Chronic Hyponatremia - 122 Patient's baseline sodium over the last 2 years is 130. Suspect this is hypovolemic hyponatremia in the setting of poor PO intake. Patient is slightly altered on admission and received 2 L NS bolus in ED. - We will continue to monitor as patient is symptomatic - GI input appreciated   T2DM - Q 4 CBG monitoring - SSI sensitive d/t to poor PO intake - follow ICU hypo/hyper glycemia protocol   COPD without evidence of acute exacerbation PMHx: OSA, asthma Not on home O2 - bronchodilators PRN - supplemental O2 PRN to maintain SpO2 > 90%   Best practice (right click and "Reselect all SmartList Selections" daily)  Diet:  Oral Pain/Anxiety/Delirium protocol (if indicated): Yes (RASS goal 0) VAP protocol (if indicated): Not indicated DVT prophylaxis: LMWH GI prophylaxis: PPI Glucose control:  SSI Yes Central venous access:  N/A Arterial line:  N/A Foley:  N/A Mobility:  bed rest  PT consulted: N/A Last date of multidisciplinary goals of care discussion [4/24] Code Status:  DNR Disposition: stepdown  Critical care time:35       EliRufina FalcoNP, FNP-C, AGACNP-BC Acute Care Nurse Practitioner  [facility] Pager: [phone]

## 2020-04-27 DIAGNOSIS — R188 Other ascites: Secondary | ICD-10-CM | POA: Diagnosis not present

## 2020-04-27 DIAGNOSIS — K7460 Unspecified cirrhosis of liver: Secondary | ICD-10-CM | POA: Diagnosis not present

## 2020-04-27 DIAGNOSIS — E875 Hyperkalemia: Secondary | ICD-10-CM | POA: Diagnosis not present

## 2020-04-27 LAB — CBC
HCT: 27.2 % — ABNORMAL LOW (ref 36.0–46.0)
Hemoglobin: 8.9 g/dL — ABNORMAL LOW (ref 12.0–15.0)
MCH: 26.8 pg (ref 26.0–34.0)
MCHC: 32.7 g/dL (ref 30.0–36.0)
MCV: 81.9 fL (ref 80.0–100.0)
Platelets: 90 10*3/uL — ABNORMAL LOW (ref 150–400)
RBC: 3.32 MIL/uL — ABNORMAL LOW (ref 3.87–5.11)
RDW: 19.5 % — ABNORMAL HIGH (ref 11.5–15.5)
WBC: 3.9 10*3/uL — ABNORMAL LOW (ref 4.0–10.5)
nRBC: 0 % (ref 0.0–0.2)

## 2020-04-27 LAB — GLUCOSE, CAPILLARY
Glucose-Capillary: 119 mg/dL — ABNORMAL HIGH (ref 70–99)
Glucose-Capillary: 134 mg/dL — ABNORMAL HIGH (ref 70–99)
Glucose-Capillary: 108 mg/dL — ABNORMAL HIGH (ref 70–99)
Glucose-Capillary: 142 mg/dL — ABNORMAL HIGH (ref 70–99)
Glucose-Capillary: 145 mg/dL — ABNORMAL HIGH (ref 70–99)
Glucose-Capillary: 121 mg/dL — ABNORMAL HIGH (ref 70–99)
Glucose-Capillary: 138 mg/dL — ABNORMAL HIGH (ref 70–99)
Glucose-Capillary: 110 mg/dL — ABNORMAL HIGH (ref 70–99)
Glucose-Capillary: 113 mg/dL — ABNORMAL HIGH (ref 70–99)
Glucose-Capillary: 120 mg/dL — ABNORMAL HIGH (ref 70–99)

## 2020-04-27 LAB — COMPREHENSIVE METABOLIC PANEL
ALT: 21 U/L (ref 0–44)
AST: 33 U/L (ref 15–41)
Albumin: 2.4 g/dL — ABNORMAL LOW (ref 3.5–5.0)
Alkaline Phosphatase: 60 U/L (ref 38–126)
Anion gap: 4 — ABNORMAL LOW (ref 5–15)
BUN: 22 mg/dL — ABNORMAL HIGH (ref 6–20)
CO2: 20 mmol/L — ABNORMAL LOW (ref 22–32)
Calcium: 8 mg/dL — ABNORMAL LOW (ref 8.9–10.3)
Chloride: 99 mmol/L (ref 98–111)
Creatinine, Ser: 1.08 mg/dL — ABNORMAL HIGH (ref 0.44–1.00)
GFR, Estimated: >60 mL/min (ref >60)
Glucose, Bld: 144 mg/dL — ABNORMAL HIGH (ref 70–99)
Potassium: 5.1 mmol/L (ref 3.5–5.1)
Sodium: 123 mmol/L — ABNORMAL LOW (ref 135–145)
Total Bilirubin: 1 mg/dL (ref 0.3–1.2)
Total Protein: 4.5 g/dL — ABNORMAL LOW (ref 6.5–8.1)

## 2020-04-27 LAB — PHOSPHORUS: Phosphorus: 2.8 mg/dL (ref 2.5–4.6)

## 2020-04-27 LAB — SODIUM
Sodium: 120 mmol/L — ABNORMAL LOW (ref 135–145)
Sodium: 120 mmol/L — ABNORMAL LOW (ref 135–145)
Sodium: 121 mmol/L — ABNORMAL LOW (ref 135–145)
Sodium: 122 mmol/L — ABNORMAL LOW (ref 135–145)
Sodium: 122 mmol/L — ABNORMAL LOW (ref 135–145)

## 2020-04-27 LAB — AMMONIA: Ammonia: 39 umol/L — ABNORMAL HIGH (ref 9–35)

## 2020-04-27 LAB — MAGNESIUM: Magnesium: 1.9 mg/dL (ref 1.7–2.4)

## 2020-04-27 MED ORDER — BACITRACIN ZINC 500 UNIT/GM EX OINT
TOPICAL_OINTMENT | Freq: Two times a day (BID) | CUTANEOUS | Status: DC
  Administered 2020-04-28: 1 via TOPICAL
  Filled 2020-04-27: qty 0.9

## 2020-04-27 MED ORDER — ONDANSETRON HCL 4 MG/2ML IJ SOLN
4.0000 mg | Freq: Four times a day (QID) | INTRAMUSCULAR | Status: DC | PRN
  Administered 2020-04-27 – 2020-04-29 (×2): 4 mg via INTRAVENOUS
  Filled 2020-04-27 (×2): qty 2

## 2020-04-27 NOTE — Plan of Care (Signed)
Neuro: alert and oriented for the majority of the day, following commands and interactive; became confused and lethargic when her sister arrived Resp: stable on room air CV: afebrile, weaned off Levo, MAP goal >60 per ICU team GIGU: No BM, purewick in place, tolerating PO well Skin: clean dry and intact, scattered bruising Social: Patient enjoyed a visit from [place], a woman she introduced as her daughter. They worked on filling out her POA paperwork today, Andee Poles was contacted to arrange a notary tomorrow morning. The patient was happy and affectionate with [name], they shared stories of her grandson, [name] was knowledgeable of the patient's care.  This afternoon, [name], a woman who was introduced as the patient's sister visited.  The patient became confused and disoriented, stating "Who am I? Where am I?" Often speaking in "baby talk." The patient continued to follow commands and have some moments of clarity but was acting very different compared to earlier in the day.  The sister expressed concern regarding TaMara being the POA. She stated "that's not even her real daughter." This RN explained that while TaMara was visiting the patient was alert, oriented, happy and interactive, this gave no reason to be concerned with their expressed relationship. Also, that nursing is not involved in the legal decisions among families.  The sister made a few phone calls, continued to visit with the patient then left.  Shortly after the sister leaving, the patient stated, "my sister is going to be mad at me." But would not say anything further regarding the issue.   Updates and questions were addressed with all parties as the patient expressed comfort with all.   Problem: Education: Goal: Knowledge of General Education information will improve Description: Including pain rating scale, medication(s)/side effects and non-pharmacologic comfort measures Outcome: Not Progressing   Problem: Health  Behavior/Discharge Planning: Goal: Ability to manage health-related needs will improve Outcome: Not Progressing   Problem: Clinical Measurements: Goal: Ability to maintain clinical measurements within normal limits will improve Outcome: Not Progressing Goal: Will remain free from infection Outcome: Not Progressing Goal: Diagnostic test results will improve Outcome: Not Progressing Goal: Respiratory complications will improve Outcome: Not Progressing Goal: Cardiovascular complication will be avoided Outcome: Not Progressing   Problem: Activity: Goal: Risk for activity intolerance will decrease Outcome: Not Progressing   Problem: Nutrition: Goal: Adequate nutrition will be maintained Outcome: Not Progressing   Problem: Coping: Goal: Level of anxiety will decrease Outcome: Not Progressing   Problem: Elimination: Goal: Will not experience complications related to bowel motility Outcome: Not Progressing Goal: Will not experience complications related to urinary retention Outcome: Not Progressing   Problem: Pain Managment: Goal: General experience of comfort will improve Outcome: Not Progressing   Problem: Safety: Goal: Ability to remain free from injury will improve Outcome: Not Progressing   Problem: Skin Integrity: Goal: Risk for impaired skin integrity will decrease Outcome: Not Progressing

## 2020-04-27 NOTE — Progress Notes (Addendum)
PHARMACY CONSULT NOTE - FOLLOW UP  Pharmacy Consult for Electrolyte Monitoring and Replacement   Recent Labs: Potassium (mmol/L)  Date Value  04/27/2020 5.1  03/27/2014 3.5   Magnesium (mg/dL)  Date Value  04/27/2020 1.9  02/24/2012 2.4   Calcium (mg/dL)  Date Value  04/27/2020 8.0 (L)   Calcium, Total (mg/dL)  Date Value  03/27/2014 10.4 (H)   Albumin (g/dL)  Date Value  04/27/2020 2.4 (L)  03/27/2014 5.3 (H)   Phosphorus (mg/dL)  Date Value  04/27/2020 2.8   Sodium (mmol/L)  Date Value  04/27/2020 123 (L)  03/27/2014 142     Assessment: 55 y.o. female with extensive past medical history including severe psoriasis, currently on hospice, here with general fatigue.  The patient reportedly was only just released from a nursing home after recent hospital admission. Pharmacy has been consulted for electrolyte management.  Goal of Therapy:  Electrolytes WNL  Plan:   Na 123 - per NP, continue to monitor Na as pt is asymptomatic   K 5.1 - WNL, pt has daily [place] 8.4g for previous hyperkalemia; will continue for now since pt is on higher end of normal range  Re-check electrolytes with AM labs  Sherilyn Banker, PharmD Pharmacy Resident  04/27/2020 11:33 AM

## 2020-04-27 NOTE — Progress Notes (Signed)
The following message was sent by me via secure chat to Dr. Mortimer Fries, Sharlee Blew (ICU AD), the patient's primary RN Roselyn Reef Lomax), and Alda Lea, NP (Palliative Medicine):   "Patient's son, [name], spoke to me (ICU Charge RN) via telephone to express concern that he believes the person with whom the care team has been speaking for updates, decisions, etc, [name], is not actually related to the patient. Apparently, [name] is the person with whom the [facility] process has been started as well. [name] expressed concern that he doesn't believe his mother is coherent enough to sign POA paperwork. I assured him if she is actually non-coherent then no paperwork would be signed or notarized. He also stated he is the patient's only living child and that the patient's spouse has also deceased so he should be the person with whom we make decisions."

## 2020-04-27 NOTE — Progress Notes (Signed)
[place] [facility] IC16 [facility]) Hospital Liaison Hospitalized Hospice patient RN note:  Lori Mckenzie is a current hospice patient with a terminal diagnosis of nonalcoholic steatohepatitis. On 04.22, she fell in the [place] parking lot and her sister drove her to the emergency room at [facility]. Patient was found to be hypotensive and started on IV fluids and Levophed. Family then contacted Hospice. Patient was admitted to [facility] on 04.22 at 2232 with a diagnosis of renal and liver failure. She is a DNR. Per Dr. Gilford Rile, with [facility], this is a related admission.  Visited patient at bedside. She was drowsy but easily arouses. Has some periods of confusion. Does not appear to be in any distress. States that she has a pain in her right chest with a questionable rib fracture. Abrasion to right cheek noted. Call to son, [name] to provide update. LVM. Spoke with sister, [name] in the room.  Report exchanged with hospital team.  Vital Signs: BP 84/53; HR 70; Resp 16 O2 sat 96% on RA  I & O: 1,048m/125ml  Abnormal Labs: Sodium: 123 (L) CO2: 20 (L) Glucose: 144 (H) BUN: 22 (H) Creatinine: 1.08 (H) Calcium: 8.0 (L) Anion gap: 4 (L) Albumin: 2.4 (L) Total Protein: 4.5 (L) WBC: 3.9 (L) RBC: 3.32 (L) Hemoglobin: 8.9 (L) HCT: 27.2 (L) RDW: 19.5 (H) Platelets: 90 (L)  Diagnostics: CXR: IMPRESSION: No active disease.  CT Head IMPRESSION: 1. Negative non contrasted CT appearance of the brain. 2. Mild right premalar soft tissue swelling. No acute facial bone fracture. 3. Straightening of the cervical spine with degenerative changes. No acute osseous abnormality.  IV/PRN Meds: pantoprazole (PROTONIX) injection 40 mg Dose: 40 mg Freq: Daily at bedtime Route: IV Start: 04/24/20 2245  cefTRIAXone (ROCEPHIN) 2 g in sodium chloride 0.9 % 100 mL IVPB Dose: 2 g Freq: Every 24 hours Route: IV Last Dose: Stopped (04/27/20 0041) Start: 04/24/20 2315 End: 05/01/20  2314  norepinephrine (LEVOPHED) 468min 2507mremix infusion Rate: 0-37.5 mL/hr Dose: 0-10 mcg/min Freq: Continuous Route: IV Last Dose: 6 mcg/min (04/27/20 1000) Start: 04/24/20 1845  ALPRAZolam (XANAX) tablet 0.5 mg Dose: 0.5 mg Freq: Every 4 hours PRN Route: PO PRN Reason: anxiety Start: 04/26/20 1254  morphine 2 MG/ML injection 2 mg Dose: 2 mg Freq: Every 4 hours PRN Route: IV PRN Reason: severe pain Start: 04/25/20 1400  ondansetron (ZOFRAN) injection 4 mg Dose: 4 mg Freq: Every 6 hours PRN Route: IV PRN Reasons: nausea,vomiting Start: 04/27/20 0303  dextrose 50 % solution 50 mL Dose: 1 ampule Freq: Once Route: IV Start: 04/25/20 0245 End: 04/25/20 0302  fentaNYL (DURAGESIC) 25 MCG/HR 1 patch Dose: 1 patch Freq: every 72 hours Route: TD Start: 04/24/20 2330 End: 04/26/20 2239  HYDROmorphone (DILAUDID) 1 MG/ML injection Start: 04/25/20 0930 End: 04/25/20 0933  0.9 % sodium chloride infusion Dose: 1,000 mL Freq: Once Route: IV Last Dose: Stopped (04/24/20 1925) Start: 04/24/20 1815 End: 04/24/20 1925  albumin human 25 % solution 12.5 g Dose: 12.5 g Freq: Once Route: IV Last Dose: Stopped (04/25/20 1332) Start: 04/25/20 1230 End: 04/25/20 1336  albumin human 25 % solution 25 g Dose: 25 g Freq: Once Route: IV Last Dose: 25 g (04/26/20 0355) Start: 04/26/20 0415 End: 04/26/20 0355  albumin human 5 % solution 25 g Dose: 25 g Freq: Once Route: IV Last Dose: Stopped (04/25/20 0000) Start: 04/24/20 1830 End: 04/25/20 0000  calcium gluconate 1 g/ 50 mL sodium chloride IVPB Dose: 1 g Freq: Once Route: IV Last  Dose: Stopped (04/24/20 2031) Start: 04/24/20 1915 End: 04/24/20 2031  ceFEPIme (MAXIPIME) 2 g in sodium chloride 0.9 % 100 mL IVPB Dose: 2 g Freq: Once Route: IV Last Dose: Stopped (04/24/20 1936) Start: 04/24/20 1845 End: 04/24/20 1936  octreotide (SANDOSTATIN) 500 mcg in sodium chloride 0.9 % 250 mL (2 mcg/mL) infusion Rate: 25  mL/hr Dose: 50 mcg/hr Freq: Continuous Route: IV Last Dose: Stopped (04/25/20 2131) Start: 04/25/20 1400 End: 04/25/20 2009  metroNIDAZOLE (FLAGYL) IVPB 500 mg Dose: 500 mg Freq: Once Route: IV Last Dose: Stopped (04/24/20 2100) Start: 04/24/20 1845 End: 04/24/20 2100  Problem List: Syncope and collapse Hypotension due suspected hypovolemia in the setting of malnutrition Hypoalbuminemia Patient received2L NS and albumin in ED, ultimately requiring levophed drip -Continuous cardiac monitoring -Maintain MAP >60 given Cirrhosis -Levophed gtt, wean as tolerated -Continue Midodrine 10 mg TID -Consider Albumin -Echocardiogram 04/25/20 with LVEF 44-03%, grade I Diastolic dysfunction  Suspected Spontaneous Bacterial Peritonitis 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 Patient received empiric ABX in ED: flagyl,cefepime, vancomycin. PleurX catheter placed 03/19/20. Sister reports RN came and drained some fluid earlier on 04/24/20. -Monitor fever curve -Trend WBC's -Follow cultures as above: Doesn't appear Peritoneal fluid culture sent -Discussed with Dr. Mortimer Fries, will continue empiric Ceftriaxone (plan for 7 day course) -S/p Diagnostic Paracentesis 4/23: Analysis of Peritoneal fluid not consistent with SBP (Total Nucleated Cells 179, Neutrophil count 44, albumin <1.0, glucose 157, LD 20, total protein <3.0, amylase 9) -GI following, appreciate input -Continue Octreotide 200 mcg SQ TID  # Hepatic Encephalopathy>>improving Chronic pain syndrome followed by outpatient hospice PMHx: PTSD, depression and anxiety - Lactulose titrated to 3-4BM/day, - Rifaximin 559m PO bid - supportive care - avoid sedating medications as tolerated - Emphasize sleep/wake cycle - Pain management - palliative care consulted, appreciate input  Acute Kidney Injury secondary tosuspected hypovolemiain the setting  ofmalnutrition>>improving ? Hepatorenal Syndrome Hyperkalemia-6.1 on admission now improved to 5.0 Baseline Cr:0.6, Cr on admission:1.89 Acute on Chronic Hyponatremia- 122 Patient's baseline sodium over the last 2 years is 130. Suspect this is hypovolemic hyponatremia in the setting of poor PO intake. Patient is slightly altered on admission and received 2 L NS bolus in ED. -Monitor I&O's / urinary output -Follow BMP -Ensure adequate renal perfusion -Avoid nephrotoxic agents as able -Replace electrolytes as indicated -Continue Veltassa -Will continue to monitor Na+ as pt is asymtomatic  T2DM - Q 4 CBG monitoring - SSI sensitive d/t to poor PO intake - follow ICU hypo/hyper glycemia protocol  COPD without evidence of acute exacerbation PMHx: OSA, asthma Not on home O2 - bronchodilators PRN - supplemental O2 PRN to maintain SpO2 > 90% -CXR and ABG as needed  Discharge Planning: Ongoing  Family contact: LVM with son, JJenny Reichmann Spoke with sister at bedside  IDG: Updated  Goals of care: Clear  Medication list and Transfer Summary placed on Shadow Chart.  Please call with any hospice related questions or concerns.  PZandra Abts RN ASsm [facility] - [facility] City[facility]iaison  [phone]

## 2020-04-27 NOTE — Progress Notes (Signed)
   04/27/20 1222  Clinical Encounter Type  Visited With Patient and family together  Visit Type Follow-up;Spiritual support;Social support  Referral From Nurse;Social work  Consult/Referral To Patent examiner responded to a consult request from Education officer, museum and the nurse, per request of PT, to have an AD notarized. Chaplain had previously done the AD Education with this PT. Chaplain ministered with presence, and reflective listening to the PT and her daughter.  Chaplain will have it notarized tomorrow morning.

## 2020-04-27 NOTE — Progress Notes (Signed)
     Referral received for [name] :goals of care discussion. Chart reviewed and updates received from RN. Patient assessed and is unable to engage appropriately in discussions.   I was able to speak with [name], who patient has said her is main Media planner. [place] meeting scheduled for 04/28/2020 @ around 0930-1000. Family is aware we will meet at patient's bedside.   Thank you for your referral and allowing PMT to assist in [name]'s care.   Alda Lea, AGPCNP-BC Palliative Medicine Team  Phone: [phone] Amion: N. Cousar   NO CHARGE

## 2020-04-27 NOTE — Progress Notes (Signed)
NAME:  Lori Mckenzie, MRN:  [id number], DOB:  [birth year], LOS: 3 ADMISSION DATE:  04/24/2020, INITIAL CONSULTATION DATE: 04/24/20 REFERRING MD:  Dr. Ellender Hose, CHIEF COMPLAINT: Fall   Brief Patient Description  55 year old female with extensive history as below presenting to the ED with syncope and collapse found to be hypotensive, severely hypovolemic with suspected SBP and in AKI.  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 Hypertension T2DM COPD OSA -not on home oxygen Hypothyroidism Uterine cancer s/p partial hysterectomy CKD  Significant Hospital Events: Including procedures, antibiotic start and stop dates in addition to other pertinent events    04/24/20: Admit to ICU on vasopressor 4/23: GI consulted 4/25: Remains on Levophed (6 mcg), lowering MAP goal to 60 due to cirrhosis  Cultures:  4/22: SARS-CoV-2 PCR>> negative 4/22: Influenza PCR>> negative 4/22: Blood culture x2>>No growth thus far   Antimicrobials:  Ceftriaxone 4/22>>  Interim History / Subjective:  -Remains critically ill, requiring 6 mcg Levophed currently -Afebrile, on room air -Initial analysis of Peritoneal not consistent with SBP, doesn't appear cultures were sent ~ will continue empiric Rocephin (plan for 7 day course) -Measured urine output 125 last 24 hrs, however 4 documented voids with unmeasured ouput -Complaints of chronic pain  OBJECTIVE   Blood pressure 106/70, pulse 64, temperature 98.7 F (37.1 C), temperature source Oral, resp. rate 16, height 5' 2"  (1.575 m), weight 106.6 kg, SpO2 100 %.        Intake/Output Summary (Last 24 hours) at 04/27/2020 0904 Last data filed at 04/27/2020 0700 Gross per 24 hour  Intake 531.73 ml  Output 125 ml  Net 406.73 ml   Filed Weights   04/24/20 1801 04/25/20 0000 04/27/20 0358  Weight: 83.9 kg 100 kg 106.6 kg    Examination: GENERAL: Chronically ill appearing 55 year-old patient, sitting in the bed, on  room air, with no acute distress.  EYES: Pupils equal, round, reactive to light and accommodation. No scleral icterus. Extraocular muscles intact.  HEENT: Head atraumatic, normocephalic. Oropharynx and nasopharynx clear.  NECK:  Supple, no jugular venous distention. No thyroid enlargement, no tenderness.  LUNGS: Clear to auscultation bilaterally, no wheezing or rales noted, even, nonlabored, normal effort CARDIOVASCULAR: regular rate and rhythm, s1s2, no M/R/G, 2+ distal pulses ABDOMEN: Soft, nontender, slightly distended. Bowel sounds present. No organomegaly or mass. Pleurx catheter  in place EXTREMITIES: Normal bulk and tone, no deformities, 1+ pedal edema bilaterally NEUROLOGIC: Cranial nerves II through XII are intact. Muscle strength 5/5 in all extremities. Sensation intact. Gait not checked.  PSYCHIATRIC: Awake, alert and oriented x 3 (disoriented to situation).  SKIN:Open abrasion on right cheek with bruising, scattered ecchymosis bilateral upper arms    Labs/imaging that I havepersonally reviewed  (right click and "Reselect all SmartList Selections" daily)  Labs 04/27/20: Na 123, K 5.1, glucose 144, BUN 22, Cr. 1.08, Albumin 2.4, WBC 3.9, Hgb 8.9, Platelets 90 04/24/20 CT maxillofacial wo contrast >> mild right premolar soft tissue swelling, no acute facial bone fracture 04/24/2020 CT cervical spine without contrast >> straightening of the cervical spine with degenerative changes no acute osseous abnormality 04/24/2020 CT head/abdomen/pelvis without contrast >> no acute intracranial abnormality. Prior TIPS shunt placement with stable cirrhotic hepatic morphology with sequela of portal hypertension including stable splenomegaly, mild a sending portal colopathy and decreased moderate volume ascites status post peritoneal catheter placement. No acute trauma noted CXR 04/24/2020: No active disease  Labs   CBC: Recent Labs  Lab 04/24/20 1809 04/25/20  0522 04/26/20 0513 04/27/20 0653  WBC  6.0 5.0 3.0* 3.9*  NEUTROABS  --   --  2.1  --   HGB 9.3* 9.1* 9.0* 8.9*  HCT 28.8* 27.0* 26.3* 27.2*  MCV 81.8 78.5* 79.2* 81.9  PLT 116* 91* 98* 90*    Basic Metabolic Panel: Recent Labs  Lab 04/24/20 1809 04/24/20 2236 04/24/20 2300 04/25/20 0522 04/25/20 0817 04/26/20 0513 04/26/20 0813 04/26/20 1608 04/26/20 1951 04/26/20 2345 04/27/20 0351 04/27/20 0653  NA 122* 121* 122* 126*   < > 123*  123*   < > 121* 120* 120* 122* 123*  K 6.1*  --  6.7* 5.0  --  4.9  --   --   --   --   --  5.1  CL 97*  --   --  99  --  97*  --   --   --   --   --  99  CO2 20*  --   --  22  --  18*  --   --   --   --   --  20*  GLUCOSE 100*  --   --  101*  --  147*  --   --   --   --   --  144*  BUN 46*  --   --  40*  --  30*  --   --   --   --   --  22*  CREATININE 1.89*  --   --  1.45*  --  1.27*  --   --   --   --   --  1.08*  CALCIUM 8.4*  --   --  8.1*  --  8.1*  --   --   --   --   --  8.0*  MG  --  2.3  --  2.2  --  2.1  --   --   --   --   --  1.9  PHOS  --  4.1  --  3.3  --  3.2  --   --   --   --   --  2.8   < > = values in this interval not displayed.   GFR: Estimated Creatinine Clearance: 66.8 mL/min (A) (by C-G formula based on SCr of 1.08 mg/dL (H)). Recent Labs  Lab 04/24/20 1809 04/24/20 1840 04/24/20 2019 04/24/20 2313 04/25/20 0522 04/26/20 0513 04/27/20 0653  PROCALCITON  --   --   --  <0.10 <0.10 <0.10  --   WBC 6.0  --   --   --  5.0 3.0* 3.9*  LATICACIDVEN  --  1.4 1.8  --   --   --   --     Liver Function Tests: Recent Labs  Lab 04/24/20 1809 04/25/20 0522 04/26/20 0513 04/27/20 0653  AST 40 33 36 33  ALT 27 22 23 21   ALKPHOS 96 81 69 60  BILITOT 1.2 1.2 1.0 1.0  PROT 4.8* 4.6* 4.9* 4.5*  ALBUMIN 2.5* 2.5* 2.8* 2.4*   No results for input(s): LIPASE, AMYLASE in the last 168 hours. Recent Labs  Lab 04/24/20 2251 04/27/20 0351  AMMONIA 37* 39*    ABG    Component Value Date/Time   PHART 7.29 (L) 10/21/2016 1720   PCO2ART 65 (H) 10/21/2016  1720   PO2ART 69 (L) 10/21/2016 1720   HCO3 31.3 (H) 10/21/2016 1720   O2SAT 91.3 10/21/2016 1720     Coagulation  Profile: Recent Labs  Lab 04/24/20 1809  INR 1.3*    Cardiac Enzymes: No results for input(s): CKTOTAL, CKMB, CKMBINDEX, TROPONINI in the last 168 hours.  HbA1C: Hemoglobin A1C  Date/Time Value Ref Range Status  11/17/2013 04:16 AM 8.0 (H) 4.2 - 6.3 % Final    Comment:    The American Diabetes Association recommends that a primary goal of therapy should be <7% and that physicians should reevaluate the treatment regimen in patients with HbA1c values consistently >8%.   04/26/2013 04:39 PM 6.3 4.2 - 6.3 % Final    Comment:    The American Diabetes Association recommends that a primary goal of therapy should be <7% and that physicians should reevaluate the treatment regimen in patients with HbA1c values consistently >8%.    Hgb A1c MFr Bld  Date/Time Value Ref Range Status  04/25/2020 05:22 AM 5.9 (H) 4.8 - 5.6 % Final    Comment:    (NOTE) Pre diabetes:          5.7%-6.4%  Diabetes:              >6.4%  Glycemic control for   <7.0% adults with diabetes   06/12/2017 06:17 AM 9.0 (H) 4.8 - 5.6 % Final    Comment:    (NOTE) Pre diabetes:          5.7%-6.4% Diabetes:              >6.4% Glycemic control for   <7.0% adults with diabetes     CBG: Recent Labs  Lab 04/26/20 1545 04/26/20 1928 04/26/20 2349 04/27/20 0356 04/27/20 0751  GLUCAP 119* 126* 129* 119* 134*    Allergies Allergies  Allergen Reactions  . Aspirin     unknown  . Nsaids Other (See Comments)    Liver problems  . Tape Swelling  . Tylenol [Acetaminophen]     Not specified  . Vicodin [Hydrocodone-Acetaminophen] Itching and Rash     Home Medications  Prior to Admission medications   Medication Sig Start Date End Date Taking? Authorizing Provider  albuterol (PROVENTIL HFA;VENTOLIN HFA) 108 (90 Base) MCG/ACT inhaler Inhale 2 puffs into the lungs every 6 (six) hours as  needed for wheezing or shortness of breath.   Yes [provider]  albuterol (PROVENTIL) (2.5 MG/3ML) 0.083% nebulizer solution Take 2.5 mg by nebulization every 4 (four) hours as needed.   Yes [provider]  ALPRAZolam Duanne Moron) 0.5 MG tablet Take 0.5 mg by mouth every 4 (four) hours as needed. 04/23/20  Yes [provider]  citalopram (CELEXA) 10 MG tablet Take 10 mg by mouth daily. 11/27/18  Yes [provider]  diphenhydrAMINE (BENADRYL) 25 MG tablet Take 25 mg by mouth every 6 (six) hours as needed.   Yes [provider]  lactulose (CHRONULAC) 10 GM/15ML solution Take 30 mLs (20 g total) by mouth 3 (three) times daily. 10/26/16  Yes Gouru, Illene Silver, MD  morphine (MS CONTIN) 15 MG 12 hr tablet Take 3 tablets by mouth every 12 (twelve) hours. 04/23/20  Yes [provider]  nystatin (MYCOSTATIN/NYSTOP) powder Apply 1 g topically 3 (three) times daily as needed. For irritation 10/26/16  Yes Gouru, Aruna, MD  ondansetron (ZOFRAN-ODT) 4 MG disintegrating tablet Take 4 mg by mouth every 4 (four) hours as needed for nausea. 03/18/20  Yes [provider]  Oxycodone HCl 10 MG TABS Take 10 mg by mouth every 3 (three) hours as needed.   Yes [provider]  rizatriptan (MAXALT)  5 MG tablet Take 5 mg by mouth daily as needed. 06/28/17  Yes [provider]  ALPRAZolam Duanne Moron) 1 MG tablet Take 1 mg by mouth 3 (three) times daily as needed.  Patient not taking: Reported on 04/25/2020    [provider]  cholecalciferol (VITAMIN D) 1000 units tablet Take 2,000 Units by mouth daily. Patient not taking: No sig reported    [provider]  diclofenac sodium (VOLTAREN) 1 % GEL Apply 4 g topically every 8 (eight) hours as needed. Patient not taking: Reported on 04/25/2020 12/06/16 08/20/18  Milinda Pointer, MD  dicyclomine (BENTYL) 10 MG capsule Take 10 mg by mouth 2 (two) times daily. Patient not taking: No sig reported  07/23/18   [provider]  fluticasone (FLONASE) 50 MCG/ACT nasal spray Place 1-2 sprays into both nostrils daily as needed for rhinitis.  Patient not taking: No sig reported    [provider]  Folate-B12-Intrinsic Factor (INTRINSI B12-FOLATE) [phone] MCG-MCG-MG TABS Take 1 tablet by mouth daily.  Patient not taking: No sig reported    [provider]  gabapentin (NEURONTIN) 300 MG capsule Take 900 mg by mouth 3 (three) times daily.  Patient not taking: No sig reported    [provider]  insulin regular human CONCENTRATED (HUMULIN R) 500 UNIT/ML injection Inject 100-200 Units into the skin 2 (two) times a day. Inject 200 units in the morning, inject 100 units nightly. Based on glucose readings Patient not taking: No sig reported    [provider]  magnesium oxide (MAG-OX) 400 MG tablet Take 400 mg by mouth daily. Patient not taking: No sig reported    [provider]  pantoprazole (PROTONIX) 40 MG tablet Take 40 mg by mouth 2 (two) times daily before a meal. Patient not taking: No sig reported    [provider]  potassium chloride SA (K-DUR,KLOR-CON) 20 MEQ tablet Take 20 mEq by mouth daily as needed. For high blood pressure Patient not taking: No sig reported    [provider]  promethazine (PHENERGAN) 12.5 MG tablet Take 2 tablets (25 mg total) by mouth every 6 (six) hours as needed for nausea or vomiting. Patient not taking: No sig reported 09/25/16   Dustin Flock, MD  rifaximin (XIFAXAN) 550 MG TABS tablet Take 550 mg by mouth daily.  Patient not taking: No sig reported    [provider]  spironolactone (ALDACTONE) 100 MG tablet Take 400 mg by mouth daily.  Patient not taking: No sig reported    [provider]  vitamin B-12 (CYANOCOBALAMIN) 1000 MCG tablet Take 2,000 mcg by mouth daily. Patient not taking: No sig reported    [provider]  SUMAtriptan (IMITREX) 50 MG tablet Take  50 mg by mouth every 2 (two) hours as needed. For migraines  03/17/11  [provider]   Scheduled Meds: . Chlorhexidine Gluconate Cloth  6 each Topical Daily  . enoxaparin (LOVENOX) injection  0.5 mg/kg Subcutaneous Q24H  . insulin aspart  0-9 Units Subcutaneous Q4H  . lactulose  10 g Oral BID  . midodrine  10 mg Oral TID WC  . morphine  45 mg Oral Q12H  . octreotide  100 mcg Subcutaneous TID  . pantoprazole (PROTONIX) IV  40 mg Intravenous QHS  . patiromer  8.4 g Oral Daily  . rifaximin  200 mg Oral BID   Continuous Infusions: . cefTRIAXone (ROCEPHIN)  IV Stopped (04/27/20 0041)  . norepinephrine (LEVOPHED) Adult infusion 6 mcg/min (04/27/20 0700)  PRN Meds:.ALPRAZolam, docusate sodium, ipratropium-albuterol, morphine injection, ondansetron, oxyCODONE  Resolved Hospital Problem list   Hypokalemia  ASSESSMENT & PLAN   Syncope and collapse Hypotension due suspected hypovolemia in the setting of malnutrition Hypoalbuminemia Patient received 2 L NS and albumin in ED, ultimately requiring levophed drip -Continuous cardiac monitoring -Maintain MAP >60 given Cirrhosis -Levophed gtt, wean as tolerated -Continue Midodrine 10 mg TID -Consider Albumin -Echocardiogram 04/25/20 with LVEF 83-72%, grade I Diastolic dysfunction  Suspected Spontaneous Bacterial Peritonitis PMHx: 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 Patient received empiric ABX in ED: flagyl, cefepime, vancomycin. PleurX catheter placed 03/19/20. Sister reports RN came and drained some fluid earlier on 04/24/20. -Monitor fever curve -Trend WBC's -Follow cultures as above: Doesn't appear Peritoneal fluid culture sent -Discussed with Dr. Mortimer Fries, will continue empiric Ceftriaxone (plan for 7 day course) -S/p Diagnostic Paracentesis 4/23: Analysis of Peritoneal fluid not consistent with SBP (Total Nucleated Cells 179, Neutrophil count 44,  albumin <1.0, glucose 157, LD 20, total protein <3.0, amylase 9) -GI following, appreciate input -Continue Octreotide 200 mcg SQ TID  # Hepatic Encephalopathy>>improving Chronic pain syndrome followed by outpatient hospice PMHx: PTSD, depression and anxiety - Lactulose titrated to 3-4BM/day, - Rifaximin 519m PO bid - supportive care - avoid sedating medications as tolerated - Emphasize sleep/wake cycle - Pain management - palliative care consulted, appreciate input  Acute Kidney Injury secondary to suspected hypovolemia in the setting of malnutrition>>improving ? Hepatorenal Syndrome Hyperkalemia- 6.1 on admission now improved to 5.0 Baseline Cr: 0.6, Cr on admission: 1.89 Acute on Chronic Hyponatremia- 122 Patient's baseline sodium over the last 2 years is 130. Suspect this is hypovolemic hyponatremia in the setting of poor PO intake. Patient is slightly altered on admission and received 2 L NS bolus in ED. -Monitor I&O's / urinary output -Follow BMP -Ensure adequate renal perfusion -Avoid nephrotoxic agents as able -Replace electrolytes as indicated -Continue Veltassa -Will continue to monitor Na+ as pt is asymtomatic  T2DM - Q 4 CBG monitoring - SSI sensitive d/t to poor PO intake - follow ICU hypo/hyper glycemia protocol  COPD without evidence of acute exacerbation PMHx: OSA, asthma Not on home O2 - bronchodilators PRN - supplemental O2 PRN to maintain SpO2 > 90% -CXR and ABG as needed   Best practice (right click and "Reselect all SmartList Selections" daily)  Diet:  Oral Pain/Anxiety/Delirium protocol (if indicated): Yes VAP protocol (if indicated): Not indicated DVT prophylaxis: LMWH GI prophylaxis: PPI Glucose control:  SSI Yes Central venous access:  N/A Arterial line:  N/A Foley:  N/A Mobility:  bed rest  PT consulted: N/A Last date of multidisciplinary goals of care discussion [04/27/2020] Code Status:  DNR Disposition: stepdown  Critical care  time: 40 minutes       JDarel Hong AGACNP-BC [facility] Prefer epic messenger for cross cover needs If after hours, please call E-link

## 2020-04-28 DIAGNOSIS — Z7189 Other specified counseling: Secondary | ICD-10-CM

## 2020-04-28 DIAGNOSIS — E875 Hyperkalemia: Secondary | ICD-10-CM | POA: Diagnosis not present

## 2020-04-28 DIAGNOSIS — R6520 Severe sepsis without septic shock: Secondary | ICD-10-CM | POA: Diagnosis not present

## 2020-04-28 DIAGNOSIS — Z66 Do not resuscitate: Secondary | ICD-10-CM

## 2020-04-28 DIAGNOSIS — Z515 Encounter for palliative care: Secondary | ICD-10-CM

## 2020-04-28 DIAGNOSIS — A419 Sepsis, unspecified organism: Secondary | ICD-10-CM | POA: Diagnosis not present

## 2020-04-28 DIAGNOSIS — I959 Hypotension, unspecified: Secondary | ICD-10-CM | POA: Diagnosis not present

## 2020-04-28 DIAGNOSIS — K7460 Unspecified cirrhosis of liver: Secondary | ICD-10-CM | POA: Diagnosis not present

## 2020-04-28 LAB — CBC
HCT: 23.8 % — ABNORMAL LOW (ref 36.0–46.0)
Hemoglobin: 7.7 g/dL — ABNORMAL LOW (ref 12.0–15.0)
MCH: 26.6 pg (ref 26.0–34.0)
MCHC: 32.4 g/dL (ref 30.0–36.0)
MCV: 82.1 fL (ref 80.0–100.0)
Platelets: 63 10*3/uL — ABNORMAL LOW (ref 150–400)
RBC: 2.9 MIL/uL — ABNORMAL LOW (ref 3.87–5.11)
RDW: 19.4 % — ABNORMAL HIGH (ref 11.5–15.5)
WBC: 2.5 10*3/uL — ABNORMAL LOW (ref 4.0–10.5)
nRBC: 0 % (ref 0.0–0.2)

## 2020-04-28 LAB — BASIC METABOLIC PANEL
Anion gap: 5 (ref 5–15)
Anion gap: 5 (ref 5–15)
BUN: 26 mg/dL — ABNORMAL HIGH (ref 6–20)
BUN: 27 mg/dL — ABNORMAL HIGH (ref 6–20)
CO2: 18 mmol/L — ABNORMAL LOW (ref 22–32)
CO2: 18 mmol/L — ABNORMAL LOW (ref 22–32)
Calcium: 7.8 mg/dL — ABNORMAL LOW (ref 8.9–10.3)
Calcium: 7.7 mg/dL — ABNORMAL LOW (ref 8.9–10.3)
Chloride: 99 mmol/L (ref 98–111)
Chloride: 96 mmol/L — ABNORMAL LOW (ref 98–111)
Creatinine, Ser: 1.65 mg/dL — ABNORMAL HIGH (ref 0.44–1.00)
Creatinine, Ser: 1.51 mg/dL — ABNORMAL HIGH (ref 0.44–1.00)
GFR, Estimated: 36 mL/min — ABNORMAL LOW (ref >60)
GFR, Estimated: 40 mL/min — ABNORMAL LOW (ref >60)
Glucose, Bld: 92 mg/dL (ref 70–99)
Glucose, Bld: 122 mg/dL — ABNORMAL HIGH (ref 70–99)
Potassium: 5.4 mmol/L — ABNORMAL HIGH (ref 3.5–5.1)
Potassium: 5.2 mmol/L — ABNORMAL HIGH (ref 3.5–5.1)
Sodium: 122 mmol/L — ABNORMAL LOW (ref 135–145)
Sodium: 119 mmol/L — CL (ref 135–145)

## 2020-04-28 LAB — GLUCOSE, CAPILLARY
Glucose-Capillary: 107 mg/dL — ABNORMAL HIGH (ref 70–99)
Glucose-Capillary: 105 mg/dL — ABNORMAL HIGH (ref 70–99)
Glucose-Capillary: 133 mg/dL — ABNORMAL HIGH (ref 70–99)
Glucose-Capillary: 99 mg/dL (ref 70–99)
Glucose-Capillary: 110 mg/dL — ABNORMAL HIGH (ref 70–99)
Glucose-Capillary: 111 mg/dL — ABNORMAL HIGH (ref 70–99)

## 2020-04-28 LAB — SODIUM
Sodium: 119 mmol/L — CL (ref 135–145)
Sodium: 119 mmol/L — CL (ref 135–145)

## 2020-04-28 LAB — PROTEIN, BODY FLUID (OTHER)
Total Protein, Body Fluid Other: 0.6 g/dL
Total Protein, Body Fluid Other: 0.3 g/dL

## 2020-04-28 LAB — MAGNESIUM: Magnesium: 1.8 mg/dL (ref 1.7–2.4)

## 2020-04-28 LAB — PHOSPHORUS: Phosphorus: 3.5 mg/dL (ref 2.5–4.6)

## 2020-04-28 MED ORDER — ALBUMIN HUMAN 25 % IV SOLN
12.5000 g | Freq: Four times a day (QID) | INTRAVENOUS | Status: DC
  Administered 2020-04-28 – 2020-04-29 (×4): 12.5 g via INTRAVENOUS
  Filled 2020-04-28 (×4): qty 50

## 2020-04-28 MED ORDER — SODIUM BICARBONATE 8.4 % IV SOLN
INTRAVENOUS | Status: DC
  Filled 2020-04-28: qty 150
  Filled 2020-04-28: qty 1000
  Filled 2020-04-28: qty 150
  Filled 2020-04-28: qty 1000

## 2020-04-28 MED ORDER — SODIUM CHLORIDE 1 G PO TABS
2.0000 g | ORAL_TABLET | Freq: Two times a day (BID) | ORAL | Status: DC
  Administered 2020-04-28 – 2020-04-30 (×6): 2 g via ORAL
  Filled 2020-04-28 (×10): qty 2

## 2020-04-28 MED ORDER — SODIUM CHLORIDE 3 % IV BOLUS
100.0000 mL | Freq: Once | INTRAVENOUS | Status: AC
  Administered 2020-04-28: 100 mL via INTRAVENOUS
  Filled 2020-04-28: qty 500

## 2020-04-28 MED ORDER — LIDOCAINE 5 % EX PTCH
1.0000 | MEDICATED_PATCH | CUTANEOUS | Status: DC
  Administered 2020-04-28 – 2020-04-30 (×3): 1 via TRANSDERMAL
  Filled 2020-04-28 (×5): qty 1

## 2020-04-28 MED ORDER — CIPROFLOXACIN HCL 500 MG PO TABS
500.0000 mg | ORAL_TABLET | Freq: Every day | ORAL | Status: DC
  Administered 2020-05-01: 500 mg via ORAL
  Filled 2020-04-28: qty 1

## 2020-04-28 MED ORDER — PATIROMER SORBITEX CALCIUM 8.4 G PO PACK
25.2000 g | PACK | Freq: Every day | ORAL | Status: DC
  Administered 2020-04-28 – 2020-05-01 (×4): 25.2 g via ORAL
  Filled 2020-04-28 (×6): qty 3

## 2020-04-28 MED ORDER — ALBUMIN HUMAN 25 % IV SOLN
25.0000 g | Freq: Four times a day (QID) | INTRAVENOUS | Status: AC
  Administered 2020-04-28 (×2): 25 g via INTRAVENOUS
  Filled 2020-04-28 (×2): qty 100

## 2020-04-28 NOTE — Progress Notes (Signed)
MD notified of sodium of 116. New orders placed.

## 2020-04-28 NOTE — Progress Notes (Addendum)
NAME:  Lori Mckenzie, MRN:  [id number], DOB:  04-21-1965, LOS: 4 ADMISSION DATE:  04/24/2020, INITIAL CONSULTATION DATE: 04/24/20 REFERRING MD:  Dr. Ellender Hose, CHIEF COMPLAINT: Fall   Brief Patient Description  55 year old female with extensive history as below presenting to the ED with syncope and collapse found to be hypotensive, severely hypovolemic with suspected SBP and in AKI.  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 Hypertension T2DM COPD OSA -not on home oxygen Hypothyroidism Uterine cancer s/p partial hysterectomy CKD  Significant Hospital Events: Including procedures, antibiotic start and stop dates in addition to other pertinent events    04/24/20: Admit to ICU on vasopressor 4/23: GI consulted 4/25: Remains on Levophed (6 mcg), lowering MAP goal to 60 due to cirrhosis 4/26: Levophed weaned off overnight with MAP gaol of 60 did get 1 liter of Fluid removed overnight  Cultures:  4/22: SARS-CoV-2 PCR>> negative 4/22: Influenza PCR>> negative 4/22: Blood culture x2>>No growth thus far   Antimicrobials:  Ceftriaxone 4/22>>  Interim History / Subjective:  -Remains critically ill, requiring 6 mcg Levophed currently -Afebrile, on room air -Initial analysis of Peritoneal not consistent with SBP, doesn't appear cultures were sent ~ will continue empiric Rocephin (plan for 7 day course) -Measured urine output 125 last 24 hrs, however 4 documented voids with unmeasured ouput -Complaints of chronic pain  OBJECTIVE   Blood pressure (!) 83/42, pulse 63, temperature 98.4 F (36.9 C), temperature source Oral, resp. rate 10, height 5' 2"  (1.575 m), weight 110.1 kg, SpO2 100 %.        Intake/Output Summary (Last 24 hours) at 04/28/2020 1025 Last data filed at 04/28/2020 4268 Gross per 24 hour  Intake 405.61 ml  Output 2125 ml  Net -1719.39 ml   Filed Weights   04/25/20 0000 04/27/20 0358 04/28/20 0421  Weight: 100 kg 106.6 kg  110.1 kg    Examination: GENERAL: Chronically ill appearing 55 year-old patient, sitting in the bed, on room air, with no acute distress.  EYES: Pupils equal, round, reactive to light and accommodation. No scleral icterus. Extraocular muscles intact.  HEENT: Head atraumatic, normocephalic. Oropharynx and nasopharynx clear.  NECK:  Supple, no jugular venous distention. No thyroid enlargement, no tenderness.  LUNGS: Clear to auscultation bilaterally, no wheezing or rales noted, even, nonlabored, normal effort CARDIOVASCULAR: regular rate and rhythm, s1s2, no M/R/G, 2+ distal pulses ABDOMEN: Soft, nontender, slightly distended. Bowel sounds present. No organomegaly or mass. Pleurx catheter  in place EXTREMITIES: Normal bulk and tone, no deformities, 1+ pedal edema bilaterally NEUROLOGIC: Cranial nerves II through XII are intact. Muscle strength 5/5 in all extremities. Sensation intact. Gait not checked.  PSYCHIATRIC: Awake, alert and oriented x 3 (disoriented to situation).  SKIN:Open abrasion on right cheek with bruising, scattered ecchymosis bilateral upper arms    Labs/imaging that I havepersonally reviewed  (right click and "Reselect all SmartList Selections" daily)  Labs 04/27/20: Na 123, K 5.1, glucose 144, BUN 22, Cr. 1.08, Albumin 2.4, WBC 3.9, Hgb 8.9, Platelets 90 04/24/20 CT maxillofacial wo contrast >> mild right premolar soft tissue swelling, no acute facial bone fracture 04/24/2020 CT cervical spine without contrast >> straightening of the cervical spine with degenerative changes no acute osseous abnormality 04/24/2020 CT head/abdomen/pelvis without contrast >> no acute intracranial abnormality. Prior TIPS shunt placement with stable cirrhotic hepatic morphology with sequela of portal hypertension including stable splenomegaly, mild a sending portal colopathy and decreased moderate volume ascites status post peritoneal catheter placement. No acute  trauma noted CXR 04/24/2020: No active  disease  Labs   CBC: Recent Labs  Lab 04/24/20 1809 04/25/20 0522 04/26/20 0513 04/27/20 0653 04/28/20 0502  WBC 6.0 5.0 3.0* 3.9* 2.5*  NEUTROABS  --   --  2.1  --   --   HGB 9.3* 9.1* 9.0* 8.9* 7.7*  HCT 28.8* 27.0* 26.3* 27.2* 23.8*  MCV 81.8 78.5* 79.2* 81.9 82.1  PLT 116* 91* 98* 90* 63*    Basic Metabolic Panel: Recent Labs  Lab 04/24/20 1809 04/24/20 2236 04/24/20 2300 04/25/20 0522 04/25/20 0817 04/26/20 0513 04/26/20 0813 04/27/20 0653 04/27/20 1151 04/27/20 1601 04/27/20 2001 04/28/20 0019 04/28/20 0502 04/28/20 0749  NA 122* 121* 122* 126*   < > 123*  123*   < > 123*   < > 120* 122* 119* 122* 119*  K 6.1*  --  6.7* 5.0  --  4.9  --  5.1  --   --   --   --  5.4*  --   CL 97*  --   --  99  --  97*  --  99  --   --   --   --  99  --   CO2 20*  --   --  22  --  18*  --  20*  --   --   --   --  18*  --   GLUCOSE 100*  --   --  101*  --  147*  --  144*  --   --   --   --  92  --   BUN 46*  --   --  40*  --  30*  --  22*  --   --   --   --  26*  --   CREATININE 1.89*  --   --  1.45*  --  1.27*  --  1.08*  --   --   --   --  1.65*  --   CALCIUM 8.4*  --   --  8.1*  --  8.1*  --  8.0*  --   --   --   --  7.8*  --   MG  --  2.3  --  2.2  --  2.1  --  1.9  --   --   --   --  1.8  --   PHOS  --  4.1  --  3.3  --  3.2  --  2.8  --   --   --   --  3.5  --    < > = values in this interval not displayed.   GFR: Estimated Creatinine Clearance: 44.5 mL/min (A) (by C-G formula based on SCr of 1.65 mg/dL (H)). Recent Labs  Lab 04/24/20 1840 04/24/20 2019 04/24/20 2313 04/25/20 0522 04/26/20 0513 04/27/20 0653 04/28/20 0502  PROCALCITON  --   --  <0.10 <0.10 <0.10  --   --   WBC  --   --   --  5.0 3.0* 3.9* 2.5*  LATICACIDVEN 1.4 1.8  --   --   --   --   --     Liver Function Tests: Recent Labs  Lab 04/24/20 1809 04/25/20 0522 04/26/20 0513 04/27/20 0653  AST 40 33 36 33  ALT 27 22 23 21   ALKPHOS 96 81 69 60  BILITOT 1.2 1.2 1.0 1.0  PROT 4.8* 4.6*  4.9* 4.5*  ALBUMIN 2.5* 2.5* 2.8* 2.4*  No results for input(s): LIPASE, AMYLASE in the last 168 hours. Recent Labs  Lab 04/24/20 2251 04/27/20 0351  AMMONIA 37* 39*    ABG    Component Value Date/Time   PHART 7.29 (L) 10/21/2016 1720   PCO2ART 65 (H) 10/21/2016 1720   PO2ART 69 (L) 10/21/2016 1720   HCO3 31.3 (H) 10/21/2016 1720   O2SAT 91.3 10/21/2016 1720     Coagulation Profile: Recent Labs  Lab 04/24/20 1809  INR 1.3*    Cardiac Enzymes: No results for input(s): CKTOTAL, CKMB, CKMBINDEX, TROPONINI in the last 168 hours.  HbA1C: Hemoglobin A1C  Date/Time Value Ref Range Status  11/17/2013 04:16 AM 8.0 (H) 4.2 - 6.3 % Final    Comment:    The American Diabetes Association recommends that a primary goal of therapy should be <7% and that physicians should reevaluate the treatment regimen in patients with HbA1c values consistently >8%.   04/26/2013 04:39 PM 6.3 4.2 - 6.3 % Final    Comment:    The American Diabetes Association recommends that a primary goal of therapy should be <7% and that physicians should reevaluate the treatment regimen in patients with HbA1c values consistently >8%.    Hgb A1c MFr Bld  Date/Time Value Ref Range Status  04/25/2020 05:22 AM 5.9 (H) 4.8 - 5.6 % Final    Comment:    (NOTE) Pre diabetes:          5.7%-6.4%  Diabetes:              >6.4%  Glycemic control for   <7.0% adults with diabetes   06/12/2017 06:17 AM 9.0 (H) 4.8 - 5.6 % Final    Comment:    (NOTE) Pre diabetes:          5.7%-6.4% Diabetes:              >6.4% Glycemic control for   <7.0% adults with diabetes     CBG: Recent Labs  Lab 04/27/20 1924 04/27/20 2301 04/27/20 2312 04/28/20 0337 04/28/20 0814  GLUCAP 110* 120* 113* 107* 105*    Allergies Allergies  Allergen Reactions  . Aspirin     unknown  . Nsaids Other (See Comments)    Liver problems  . Tape Swelling  . Tylenol [Acetaminophen]     Not specified  . Vicodin  [Hydrocodone-Acetaminophen] Itching and Rash     Home Medications  Prior to Admission medications   Medication Sig Start Date End Date Taking? Authorizing Provider  albuterol (PROVENTIL HFA;VENTOLIN HFA) 108 (90 Base) MCG/ACT inhaler Inhale 2 puffs into the lungs every 6 (six) hours as needed for wheezing or shortness of breath.   Yes [provider]  albuterol (PROVENTIL) (2.5 MG/3ML) 0.083% nebulizer solution Take 2.5 mg by nebulization every 4 (four) hours as needed.   Yes [provider]  ALPRAZolam Duanne Moron) 0.5 MG tablet Take 0.5 mg by mouth every 4 (four) hours as needed. 04/23/20  Yes [provider]  citalopram (CELEXA) 10 MG tablet Take 10 mg by mouth daily. 11/27/18  Yes [provider]  diphenhydrAMINE (BENADRYL) 25 MG tablet Take 25 mg by mouth every 6 (six) hours as needed.   Yes [provider]  lactulose (CHRONULAC) 10 GM/15ML solution Take 30 mLs (20 g total) by mouth 3 (three) times daily. 10/26/16  Yes Gouru, Illene Silver, MD  morphine (MS CONTIN) 15 MG 12 hr tablet Take 3 tablets by mouth every 12 (twelve) hours. 04/23/20  Yes [provider]  nystatin (  MYCOSTATIN/NYSTOP) powder Apply 1 g topically 3 (three) times daily as needed. For irritation 10/26/16  Yes Gouru, Aruna, MD  ondansetron (ZOFRAN-ODT) 4 MG disintegrating tablet Take 4 mg by mouth every 4 (four) hours as needed for nausea. 03/18/20  Yes [provider]  Oxycodone HCl 10 MG TABS Take 10 mg by mouth every 3 (three) hours as needed.   Yes [provider]  rizatriptan (MAXALT) 5 MG tablet Take 5 mg by mouth daily as needed. 06/28/17  Yes [provider]  ALPRAZolam Duanne Moron) 1 MG tablet Take 1 mg by mouth 3 (three) times daily as needed.  Patient not taking: Reported on 04/25/2020    [provider]  cholecalciferol (VITAMIN D) 1000 units tablet Take 2,000 Units by mouth daily. Patient not taking: No sig reported    [provider]   diclofenac sodium (VOLTAREN) 1 % GEL Apply 4 g topically every 8 (eight) hours as needed. Patient not taking: Reported on 04/25/2020 12/06/16 08/20/18  Milinda Pointer, MD  dicyclomine (BENTYL) 10 MG capsule Take 10 mg by mouth 2 (two) times daily. Patient not taking: No sig reported 07/23/18   [provider]  fluticasone (FLONASE) 50 MCG/ACT nasal spray Place 1-2 sprays into both nostrils daily as needed for rhinitis.  Patient not taking: No sig reported    [provider]  Folate-B12-Intrinsic Factor (INTRINSI B12-FOLATE) 226-333-54 MCG-MCG-MG TABS Take 1 tablet by mouth daily.  Patient not taking: No sig reported    [provider]  gabapentin (NEURONTIN) 300 MG capsule Take 900 mg by mouth 3 (three) times daily.  Patient not taking: No sig reported    [provider]  insulin regular human CONCENTRATED (HUMULIN R) 500 UNIT/ML injection Inject 100-200 Units into the skin 2 (two) times a day. Inject 200 units in the morning, inject 100 units nightly. Based on glucose readings Patient not taking: No sig reported    [provider]  magnesium oxide (MAG-OX) 400 MG tablet Take 400 mg by mouth daily. Patient not taking: No sig reported    [provider]  pantoprazole (PROTONIX) 40 MG tablet Take 40 mg by mouth 2 (two) times daily before a meal. Patient not taking: No sig reported    [provider]  potassium chloride SA (K-DUR,KLOR-CON) 20 MEQ tablet Take 20 mEq by mouth daily as needed. For high blood pressure Patient not taking: No sig reported    [provider]  promethazine (PHENERGAN) 12.5 MG tablet Take 2 tablets (25 mg total) by mouth every 6 (six) hours as needed for nausea or vomiting. Patient not taking: No sig reported 09/25/16   Dustin Flock, MD  rifaximin (XIFAXAN) 550 MG TABS tablet Take 550 mg by mouth daily.  Patient not taking: No sig reported    [provider]  spironolactone (ALDACTONE) 100  MG tablet Take 400 mg by mouth daily.  Patient not taking: No sig reported    [provider]  vitamin B-12 (CYANOCOBALAMIN) 1000 MCG tablet Take 2,000 mcg by mouth daily. Patient not taking: No sig reported    [provider]  SUMAtriptan (IMITREX) 50 MG tablet Take 50 mg by mouth every 2 (two) hours as needed. For migraines  03/17/11  [provider]   Scheduled Meds: . bacitracin   Topical BID  . Chlorhexidine Gluconate Cloth  6 each Topical Daily  . [START ON 05/01/2020] ciprofloxacin  500 mg Oral Daily  . enoxaparin (LOVENOX) injection  0.5 mg/kg Subcutaneous Q24H  .  insulin aspart  0-9 Units Subcutaneous Q4H  . lactulose  10 g Oral BID  . midodrine  10 mg Oral TID WC  . morphine  45 mg Oral Q12H  . octreotide  100 mcg Subcutaneous TID  . pantoprazole (PROTONIX) IV  40 mg Intravenous QHS  . patiromer  25.2 g Oral Daily  . rifaximin  200 mg Oral BID  . sodium chloride  2 g Oral BID WC   Continuous Infusions: . albumin human    . cefTRIAXone (ROCEPHIN)  IV Stopped (04/27/20 2313)  . norepinephrine (LEVOPHED) Adult infusion Stopped (04/27/20 1808)  . sodium bicarbonate 150 mEq in D5W infusion 100 mL/hr at 04/28/20 0949   PRN Meds:.ALPRAZolam, docusate sodium, ipratropium-albuterol, morphine injection, ondansetron, oxyCODONE  Resolved Hospital Problem list   Hypokalemia  ASSESSMENT & PLAN   Syncope and collapse Hypotension due suspected hypovolemia in the setting of malnutrition Hypoalbuminemia -Continuous cardiac monitoring -Maintain MAP >60 given Cirrhosis -Levophed gtt, wean as tolerated -Continue Midodrine 10 mg TID -Echocardiogram 04/25/20 with LVEF 62-95%, grade I Diastolic dysfunction  Suspected Spontaneous Bacterial Peritonitis PMHx: 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 Patient received empiric ABX in ED: flagyl, cefepime, vancomycin. PleurX catheter placed  03/19/20. Sister reports RN came and drained some fluid earlier on 04/24/20. -Discussed with Dr. Mortimer Fries, will continue empiric Ceftriaxone (plan for 7 day course) -S/p Diagnostic Paracentesis 4/23: Analysis of Peritoneal fluid not consistent with SBP (Total Nucleated Cells 179, Neutrophil count 44, albumin <1.0, glucose 157, LD 20, total protein <3.0, amylase 9) -GI following, appreciate input -Continue Octreotide 200 mcg SQ TID -Continue Rocephin for empiric treatment of SBP.  When finished would start ciprofloxacin for prophylactic dosing. -Can drain fluid every 12 hours as needed 2 liters at a time for discomfort  # Hepatic Encephalopathy>>improving Chronic pain syndrome followed by outpatient hospice PMHx: PTSD, depression and anxiety - Lactulose titrated to 3-4BM/day, - Rifaximin 544m PO bid - supportive care - avoid sedating medications as tolerated - Emphasize sleep/wake cycle - Pain management - palliative care consulted, appreciate input  Acute Kidney Injury secondary to suspected hypovolemia in the setting of malnutrition>> worsening again overnight.  Patient has intermittently received albumin after drainage of peritoneal fluid would continue this for a course of 3 days Hyperkalemia- worsening to 5.4 this a.m. Baseline Cr: 0.6, Cr on admission: 1.89.  Has increased to current creatinine which is 1.65 Acute on Chronic Hyponatremia- 122 Patient's baseline sodium over the last 2 years is 130. Suspect this is hypovolemic hyponatremia in the setting of poor PO intake. Patient is slightly altered on admission and received 2 L NS bolus in ED. -Monitor I&O's / urinary output -Follow BMP every 6 -Continue Veltassa -Will continue to monitor Na+  -Sodium tablets initiated. -Repeat BMP this afternoon and put in Foley for accurate I's and O's. -Start bicarb infusion and give albumin 1g/kg for 2-3 days -Ensure adequate renal perfusion -Avoid nephrotoxic agents as able -Replace electrolytes  as indicated  T2DM - Q 4 CBG monitoring - SSI sensitive d/t to poor PO intake - follow ICU hypo/hyper glycemia protocol  COPD without evidence of acute exacerbation PMHx: OSA, asthma Not on home O2 - bronchodilators PRN - supplemental O2 PRN to maintain SpO2 > 90% -CXR and ABG as needed   Best practice (right click and "Reselect all SmartList Selections" daily)  Diet:  Oral Pain/Anxiety/Delirium protocol (if indicated): Yes VAP protocol (if indicated): Not indicated DVT prophylaxis: Subcutaneous Heparin  GI prophylaxis: PPI Glucose control:  SSI Yes Central venous access:  N/A Arterial line:  N/A Foley:  N/A Mobility:  bed rest  PT consulted: N/A Last date of multidisciplinary goals of care discussion [04/27/2020] Code Status:  DNR Disposition: stepdown  Critical care time: 40 minutes

## 2020-04-28 NOTE — Progress Notes (Signed)
  Chaplain On-Call visited with the patient upon referral from [employer].  Chaplains are attempting to assist the patient with the completion of her Advance Directives documents.  However, the patient told this Chaplain that either her daughter or daughter-in-law had taken the AD documents home.  Chaplains will continue to provide spiritual and emotional support for the patient, and offer further assistance with AD documents as needed.  Chaplain Pollyann Samples M.Div., [facility]

## 2020-04-28 NOTE — Progress Notes (Signed)
[place] Room IC16 [facility]) Hospital Liaison Hospitalized Hospice patient RN note:  Lori Mckenzie is a current hospice patient with a terminal diagnosis of nonalcoholic steatohepatitis. On 04.22, she fell in the [place] parking lot and her sister drove her to the emergency room at [facility]. Patient was found to be hypotensive and started on IV fluids and Levophed. Family then contacted Hospice. Patient was admitted to [facility] on 04.22 at 2232 with a diagnosis of renal and liver failure. She is a DNR. Per Dr. Gilford Rile, with [facility], this is a related admission.  Visited with patient at bedside. Son, [name] and sister, [name] was present. She was more alert and oriented today. States she feels better today, but still has some pain to her right rib area. She has been weaned off of Levophed overnight and one liter or fluid removed via pleurex and BP has remained stable. Hyperkalemia is worsening and infusion of bicarb and albumin given. Report exchanged with hospital care team.   Vital signs: BP 123/70; HR 63; Resp 19; Temp 98.9; O2 sats 100% on RA  I & O: 359m/2000ml  Abnormal labs: Sodium: 119 (LL) Potassium: 5.2 (H) Chloride: 96 (L) CO2: 18 (L) Glucose: 122 (H) BUN: 27 (H) Creatinine: 1.51 (H) Calcium: 7.7 (L) GFR, Estimated: 40 (L)  Diagnostics: none new  IV/PRN Meds: pantoprazole (PROTONIX) injection 40 mg Dose: 40 mg Freq: Daily at bedtime Route: IV Start: 04/24/20 2245  albumin human 25 % solution 12.5 g Dose: 12.5 g Freq: Every 6 hours Route: IV Start: 04/28/20 1200 End: 05/01/20 1159  cefTRIAXone (ROCEPHIN) 2 g in sodium chloride 0.9 % 100 mL IVPB Dose: 2 g Freq: Every 24 hours Route: IV Last Dose: Stopped (04/27/20 2313) Start: 04/24/20 2315 End: 05/01/20 2314  sodium bicarbonate 150 mEq in dextrose 5 % 1,150 mL infusion Rate: 100 mL/hr Freq: Continuous Route: IV Start: 04/28/20 0830  morphine 2 MG/ML injection 2 mg Dose: 2 mg Freq: Every 4  hours PRN Route: IV PRN Reason: severe pain Start: 04/25/20 1400  Problem List: Syncope and collapse Hypotension due suspected hypovolemia in the setting of malnutrition Hypoalbuminemia -Continuous cardiac monitoring -Maintain MAP >60 given Cirrhosis -Levophed gtt, wean as tolerated -Continue Midodrine 10 mg TID -Echocardiogram 04/25/20 with LVEF 697-35% grade I Diastolic dysfunction  Suspected Spontaneous Bacterial Peritonitis 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 Patient received empiric ABX in ED: flagyl,cefepime, vancomycin. PleurX catheter placed 03/19/20. Sister reports RN came and drained some fluid earlier on 04/24/20. -Discussed with Dr. KMortimer Fries will continue empiric Ceftriaxone (plan for 7 day course) -S/p Diagnostic Paracentesis 4/23: Analysis of Peritoneal fluid not consistent with SBP (Total Nucleated Cells 179, Neutrophil count 44, albumin <1.0, glucose 157, LD 20, total protein <3.0, amylase 9) -GI following, appreciate input -Continue Octreotide 200 mcg SQ TID -Continue Rocephin for empiric treatment of SBP.  When finished would start ciprofloxacin for prophylactic dosing. -Can drain fluid every 12 hours as needed 2 liters at a time for discomfort  # Hepatic Encephalopathy>>improving Chronic pain syndrome followed by outpatient hospice PMHx: PTSD, depression and anxiety - Lactulose titrated to 3-4BM/day, - Rifaximin 5529mPO bid - supportive care - avoid sedating medications as tolerated - Emphasize sleep/wake cycle - Pain management - palliative care consulted, appreciate input  Acute Kidney Injury secondary tosuspected hypovolemiain the setting ofmalnutrition>> worsening again overnight.  Patient has intermittently received albumin after drainage of peritoneal fluid would continue this for a course of 3  days Hyperkalemia-worsening to 5.4 this a.m. Baseline Cr:0.6, Cr on  admission:1.89.  Has increased to current creatinine which is 1.65 Acute on Chronic Hyponatremia- 122 Patient's baseline sodium over the last 2 years is 130. Suspect this is hypovolemic hyponatremia in the setting of poor PO intake. Patient is slightly altered on admission and received 2 L NS bolus in ED. -Monitor I&O's / urinary output -Follow BMP every 6 -Continue Veltassa -Will continue to monitor Na+  -Sodium tablets initiated. -Repeat BMP this afternoon and put in Foley for accurate I's and O's. -Start bicarb infusion and give albumin 1g/kg for 2-3 days -Ensure adequate renal perfusion -Avoid nephrotoxic agents as able -Replace electrolytes as indicated  T2DM - Q 4 CBG monitoring - SSI sensitive d/t to poor PO intake - follow ICU hypo/hyper glycemia protocol  COPD without evidence of acute exacerbation PMHx: OSA, asthma Not on home O2 - bronchodilators PRN - supplemental O2 PRN to maintain SpO2 > 90% -CXR and ABG as needed  Discharge Planning: Ongoing  Family contact: spoke with son and sister in room  IDG: Updated   Goals of Care: clear; Palliative care consult today   Please call with any hospice related questions or concerns.  Zandra Abts, RN [facility] Liaison  [phone]

## 2020-04-28 NOTE — Consult Note (Signed)
Consultation Note Date: 04/28/2020   Patient Name: Lori Mckenzie  DOB: 07/27/1965  MRN: [id number]  Age / Sex: 55 y.o., female   PCP: Pcp, No Referring Physician: Tyna Jaksch, MD   REASON FOR CONSULTATION:Establishing goals of care  Palliative Care consult requested for goals of care discussion in this 55 y.o. female with a medical history significant for hypertension, diabetes type 2, OSA, COPD, hypothyroidism, uterine cancer s/p partial hysterectomy, CKD, and NASH with refractory ascites s/p Pleurx catheter placement.  Patient presented to the ED after falling while shopping at [facility].  Upon arrival patient was disoriented, lethargic, weak, and hypertensive.  CT of head, abdomen, pelvis, maxillary, and cervical spine all negative for acute abnormalities.  Since admission patient requiring Levophed.  Initiated on antibiotics.  Clinical Assessment and Goals of Care: I have reviewed medical records including lab results, imaging, Epic notes, and MAR, received report from the bedside RN, and assessed the patient.   I met at the bedside with patient, her son [name], and patient's Sister [name] to discuss diagnosis prognosis, [place], EOL wishes, disposition and options.    Ms. Urbanczyk is awake, alert and oriented x3.  Some intermittent confusion noted during discussions.  Patient was recently at residential hospice of the [facility] facility in [place].  She recently moved to the [place] area with her sister and continued care under hospice via [facility].   I re-introduced Palliative Medicine as specialized medical care for people living with serious illness. It focuses on providing relief from the symptoms and stress of a serious illness. The goal is to improve quality of life for both the patient and the family.  We discussed a brief life review of the patient, along with her functional and nutritional status.  She is widowed.  She had 3 biological children and  unfortunately 2 are deceased.  Patient has 2 close family members who she refers to as her "adopted children" which are [place] and [employer].  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  Prior to admission patient was living in the home most recently with her Sister [name].  Patient required considerable amount of assistance with ADLs.  Ambulatory with assistive devices.  Appetite poor.  We discussed Her current illness and what it means in the larger context of Her on-going co-morbidities. Natural disease trajectory and expectations at EOL were discussed.  Patient and family verbalized understanding of her current illness and comorbidities.  We discussed at length disease trajectory and goals of care.    A detailed discussion was had today regarding advanced directives.  Concepts specific to code status, artifical feeding and hydration, continued IV antibiotics and rehospitalization.   Patient states she does not wish for any artificial feeding/PEG tube, no dialysis, and no surgical interventions or invasive treatments/interventions. Confirms wishes for DNR/DNI. She does not have a completed advanced directive however states she was in the process of completing.  Patient states she was originally naming her adopted daughter as her healthcare power of attorney however her wishes are for her son to remain her sole decision maker with support of her Sister [name].  The difference between a aggressive medical intervention and a palliative comfort care path were discussed at length.   Values and goals of care important to patient and family were attempted to be elicited.   Patient and family are clear and expressed goals to continue to treat the treatable without escalation of care.  They understand patient's poor prognosis and disease trajectory.  They are not interested in aggressive medical interventions and is hopeful patient will discharge soon back home with her  [place]ayer [place] with continued outpatient hospice support.  Family is clear in their understanding patient would not have much meaningful recovery and the goal is to focus on her comfort and symptom management allow her to spend what time she has left with her family and friends.   Patient is currently active with a [facility] patient palliative.  I provided further education on their goals and philosophy of care and how they align with patient's expressed wishes.  Hospice liaison, Kieth Brightly RN is at the bedside offering support and continued education to patient and family.  Patient reports current pain regimen is providing some relief. No changes necessary.  Questions and concerns were addressed. The family was encouraged to call with questions or concerns.  PMT will continue to support holistically as needed.   CODE STATUS: DNR  ADVANCE DIRECTIVES: Primary Decision Maker: Josh (son) and [name] (sister).   SYMPTOM MANAGEMENT: Per attending  Palliative Prophylaxis:   Bowel Regimen, Delirium Protocol, Eye Care, Frequent Pain Assessment and Turn Reposition  PSYCHO-SOCIAL/SPIRITUAL:  Support System: Family  Desire for further Chaplaincy support:No   Additional Recommendations (Limitations, Scope, Preferences):  No Artificial Feeding, No Surgical Procedures and treat the treatable, no escalation, goal of discharging home with palliative  Education on hospice/palliative    PAST MEDICAL HISTORY: Past Medical History:  Diagnosis Date  . Abdominal abscess 08/25/2014  . Acid reflux 08/10/2010  . Acute cervical myofascial strain 02/09/2016  . Acute postoperative pain 08/09/2016  . Anxiety   . Ascites   . Asthma   . Back pain   . Bile leak, postoperative 07/17/2014  . Brittle bone disease   . Cancer ([place])    Uteriine  ca 64yr ago partial hysterectomy  . Cervical disc disease   . Chronic kidney disease   . Collagen vascular disease ([place]    RA  3-4 yrs ago  . COPD (chronic obstructive  pulmonary disease) ([place]   . Diabetes mellitus without complication ([place]   . GERD (gastroesophageal reflux disease)   . Hypertension   . Hypothyroidism   . Left upper quadrant pain 01/09/2014  . Major depressive disorder with single episode 12/05/2011  . Major depressive disorder, single episode 12/05/2011  . Migraines   . NASH (nonalcoholic steatohepatitis)   . Respiratory infection    2/17  . [place] ([place] 09/18/2014  . Sleep apnea   . Sleep apnea   . Syncope 11/16/2014  . Thyroid disease   . TIA (transient ischemic attack)     ALLERGIES:  is allergic to aspirin, nsaids, tape, tylenol [acetaminophen], and vicodin [hydrocodone-acetaminophen].   MEDICATIONS:  Current Facility-Administered Medications  Medication Dose Route Frequency Provider Last Rate Last Admin  . albumin human 25 % solution 12.5 g  12.5 g Intravenous Q6H ITyna Jaksch MD      . ALPRAZolam (Duanne Moron tablet 0.5 mg  0.5 mg Oral Q4H PRN OLang Snow NP   0.5 mg at 04/27/20 0915  . bacitracin ointment   Topical BID KFlora Lipps MD   1 application at 022/29/790944  . cefTRIAXone (ROCEPHIN) 2 g in sodium chloride 0.9 % 100 mL IVPB  2 g Intravenous Q24H KFlora Lipps MD   Stopped at 04/27/20 2313  . Chlorhexidine Gluconate Cloth 2 % PADS 6 each  6 each Topical Daily KFlora Lipps MD   6  each at 04/26/20 1020  . [START ON 05/01/2020] ciprofloxacin (CIPRO) tablet 500 mg  500 mg Oral Daily Tyna Jaksch, MD      . docusate sodium (COLACE) capsule 100 mg  100 mg Oral BID PRN Rust-Chester, Britton L, NP      . enoxaparin (LOVENOX) injection 50 mg  0.5 mg/kg Subcutaneous Q24H Flora Lipps, MD   50 mg at 04/27/20 2157  . insulin aspart (novoLOG) injection 0-9 Units  0-9 Units Subcutaneous Q4H Rust-Chester, Britton L, NP   1 Units at 04/27/20 1618  . ipratropium-albuterol (DUONEB) 0.5-2.5 (3) MG/3ML nebulizer solution 3 mL  3 mL Nebulization Q6H PRN Rust-Chester, Britton L, NP      . lactulose (CHRONULAC) 10  GM/15ML solution 10 g  10 g Oral BID Lang Snow, NP   10 g at 04/26/20 2117  . midodrine (PROAMATINE) tablet 10 mg  10 mg Oral TID WC Lang Snow, NP   10 mg at 04/28/20 0827  . morphine (MS CONTIN) 12 hr tablet 45 mg  45 mg Oral Q12H Lang Snow, NP   45 mg at 04/28/20 0222  . morphine 2 MG/ML injection 2 mg  2 mg Intravenous Q4H PRN Lang Snow, NP   2 mg at 04/27/20 0340  . norepinephrine (LEVOPHED) 43m in 2549mpremix infusion  0-10 mcg/min Intravenous Continuous Rust-Chester, BrHuel CoteNP   Stopped at 04/27/20 1808  . octreotide (SANDOSTATIN) injection 100 mcg  100 mcg Subcutaneous TID Rust-Chester, Britton L, NP   100 mcg at 04/28/20 0944  . ondansetron (ZOFRAN) injection 4 mg  4 mg Intravenous Q6H PRN Rust-Chester, Britton L, NP   4 mg at 04/27/20 0340  . oxyCODONE (Oxy IR/ROXICODONE) immediate release tablet 10 mg  10 mg Oral Q4H PRN OuLang SnowNP   10 mg at 04/26/20 1944  . pantoprazole (PROTONIX) injection 40 mg  40 mg Intravenous QHS Rust-Chester, Britton L, NP   40 mg at 04/27/20 2156  . patiromer (VDaryll Drownpacket 25.2 g  25.2 g Oral Daily IzTyna JakschMD   25.2 g at 04/28/20 0936  . rifaximin (XIFAXAN) tablet 200 mg  200 mg Oral BID OuLang SnowNP   200 mg at 04/28/20 0935  . sodium bicarbonate 150 mEq in dextrose 5 % 1,150 mL infusion   Intravenous Continuous IzTyna JakschMD 100 mL/hr at 04/28/20 0949 New Bag at 04/28/20 0949  . sodium chloride tablet 2 g  2 g Oral BID WC IzTyna JakschMD   2 g at 04/28/20 0935    VITAL SIGNS: BP 106/70   Pulse 71   Temp 98.4 F (36.9 C) (Oral)   Resp (!) 22   Ht 5' 2"  (1.575 m)   Wt 110.1 kg   SpO2 98%   BMI 44.40 kg/m  Filed Weights   04/25/20 0000 04/27/20 0358 04/28/20 0421  Weight: 100 kg 106.6 kg 110.1 kg    Estimated body mass index is 44.4 kg/m as calculated from the following:   Height as of this encounter: 5' 2"  (1.575 m).    Weight as of this encounter: 110.1 kg.  LABS: CBC:    Component Value Date/Time   WBC 2.5 (L) 04/28/2020 0502   HGB 7.7 (L) 04/28/2020 0502   HGB 16.5 (H) 03/27/2014 1042   HCT 23.8 (L) 04/28/2020 0502   HCT 49.3 (H) 03/27/2014 1042   PLT 63 (L) 04/28/2020 0502   PLT 209 03/27/2014 1042  Comprehensive Metabolic Panel:    Component Value Date/Time   NA 119 (LL) 04/28/2020 0749   NA 142 03/27/2014 1042   K 5.4 (H) 04/28/2020 0502   K 3.5 03/27/2014 1042   BUN 26 (H) 04/28/2020 0502   BUN 16 03/27/2014 1042   CREATININE 1.65 (H) 04/28/2020 0502   CREATININE 0.73 03/27/2014 1042   ALBUMIN 2.4 (L) 04/27/2020 0653   ALBUMIN 5.3 (H) 03/27/2014 1042     Review of Systems  Musculoskeletal: Positive for arthralgias.  Neurological: Positive for weakness.  Unless otherwise noted, a complete review of systems is negative.  Physical Exam General: NAD, frail chronically-ill appearing Cardiovascular: regular rate and rhythm Pulmonary: diminished bilaterally  Abdomen: soft, nontender, mild distention + bowel sounds Extremities: no edema, no joint deformities Skin: no rashes, warm and dry, scattered ecchymosis to face, upper extremities s/p fall Neurological: AAO x3, with some intermittent confusion, mood appropriate    Prognosis: < 6 months  Discharge Planning:  Home with Hospice  Recommendations: . DNR/DNI-as confirmed by patient and family . Continue with current plan of care, no escalation or aggressive interventions, treat the treatable with a goal of returning home with hospice and ongoing symptom management/EOL care.  . No artificial feeding tubes/HD/surgical interventions.  . Family hopeful patient may discharge back home with her Sister [name] and continue with [facility]'s support. Kieth Brightly, RN (hospice liaison) is at the bedside. Marland Kitchen PMT will continue to support and follow as needed. Please call team line with urgent needs.   Palliative Performance Scale: PPS 30%               Patient and family expressed understanding and was in agreement with this plan.   Thank you for allowing the Palliative Medicine Team to assist in the care of this patient. Please utilize secure chat with additional questions, if there is no response within 30 minutes please call the above phone number.   Time In: 1105 Time Out: 1155 Time Total: 50 min.   Visit consisted of counseling and education dealing with the complex and emotionally intense issues of symptom management and palliative care in the setting of serious and potentially life-threatening illness.Greater than 50%  of this time was spent counseling and coordinating care related to the above assessment and plan.  Signed by:  Alda Lea, AGPCNP-BC Palliative Medicine Team  Phone: [phone] Pager: [phone] Amion: [place] Team providers are available by phone from 7am to 7pm daily and can be reached through the team cell phone.  Should this patient require assistance outside of these hours, please call the patient's attending physician.

## 2020-04-28 NOTE — Progress Notes (Signed)
PHARMACY CONSULT NOTE - FOLLOW UP  Pharmacy Consult for Electrolyte Monitoring and Replacement   Recent Labs: Potassium (mmol/L)  Date Value  04/28/2020 5.4 (H)  03/27/2014 3.5   Magnesium (mg/dL)  Date Value  04/28/2020 1.8  02/24/2012 2.4   Calcium (mg/dL)  Date Value  04/28/2020 7.8 (L)   Calcium, Total (mg/dL)  Date Value  03/27/2014 10.4 (H)   Albumin (g/dL)  Date Value  04/27/2020 2.4 (L)  03/27/2014 5.3 (H)   Phosphorus (mg/dL)  Date Value  04/28/2020 3.5   Sodium (mmol/L)  Date Value  04/28/2020 122 (L)  03/27/2014 142     Assessment: 55 y.o. female with extensive past medical history including severe psoriasis, currently on hospice, here with general fatigue.  The patient reportedly was only just released from a nursing home after recent hospital admission. Pharmacy has been consulted for electrolyte management.  Goal of Therapy:  Electrolytes WNL  Plan:   Na 122 - per NP, continue to monitor Na as pt is asymptomatic   K 5.4 - pt has orders for daily Veltassa 8.4g  Re-check electrolytes with AM labs  Sherilyn Banker, PharmD Pharmacy Resident  04/28/2020 6:41 AM

## 2020-04-29 DIAGNOSIS — W19XXXA Unspecified fall, initial encounter: Secondary | ICD-10-CM

## 2020-04-29 DIAGNOSIS — K7581 Nonalcoholic steatohepatitis (NASH): Secondary | ICD-10-CM

## 2020-04-29 DIAGNOSIS — G894 Chronic pain syndrome: Secondary | ICD-10-CM | POA: Diagnosis not present

## 2020-04-29 DIAGNOSIS — K7460 Unspecified cirrhosis of liver: Secondary | ICD-10-CM | POA: Diagnosis not present

## 2020-04-29 DIAGNOSIS — A419 Sepsis, unspecified organism: Secondary | ICD-10-CM | POA: Diagnosis not present

## 2020-04-29 DIAGNOSIS — R6520 Severe sepsis without septic shock: Secondary | ICD-10-CM | POA: Diagnosis not present

## 2020-04-29 LAB — CBC WITH DIFFERENTIAL/PLATELET
Abs Immature Granulocytes: 0.01 10*3/uL (ref 0.00–0.07)
Abs Immature Granulocytes: 0.01 10*3/uL (ref 0.00–0.07)
Basophils Absolute: 0 10*3/uL (ref 0.0–0.1)
Basophils Absolute: 0 10*3/uL (ref 0.0–0.1)
Basophils Relative: 1 %
Basophils Relative: 1 %
Eosinophils Absolute: 0.1 10*3/uL (ref 0.0–0.5)
Eosinophils Absolute: 0 10*3/uL (ref 0.0–0.5)
Eosinophils Relative: 3 %
Eosinophils Relative: 2 %
HCT: 21.5 % — ABNORMAL LOW (ref 36.0–46.0)
HCT: 21.5 % — ABNORMAL LOW (ref 36.0–46.0)
Hemoglobin: 6.9 g/dL — ABNORMAL LOW (ref 12.0–15.0)
Hemoglobin: 6.8 g/dL — ABNORMAL LOW (ref 12.0–15.0)
Immature Granulocytes: 1 %
Immature Granulocytes: 1 %
Lymphocytes Relative: 11 %
Lymphocytes Relative: 10 %
Lymphs Abs: 0.2 10*3/uL — ABNORMAL LOW (ref 0.7–4.0)
Lymphs Abs: 0.2 10*3/uL — ABNORMAL LOW (ref 0.7–4.0)
MCH: 26.8 pg (ref 26.0–34.0)
MCH: 26.4 pg (ref 26.0–34.0)
MCHC: 32.1 g/dL (ref 30.0–36.0)
MCHC: 31.6 g/dL (ref 30.0–36.0)
MCV: 83.7 fL (ref 80.0–100.0)
MCV: 83.3 fL (ref 80.0–100.0)
Monocytes Absolute: 0.3 10*3/uL (ref 0.1–1.0)
Monocytes Absolute: 0.3 10*3/uL (ref 0.1–1.0)
Monocytes Relative: 17 %
Monocytes Relative: 13 %
Neutro Abs: 1.3 10*3/uL — ABNORMAL LOW (ref 1.7–7.7)
Neutro Abs: 1.4 10*3/uL — ABNORMAL LOW (ref 1.7–7.7)
Neutrophils Relative %: 67 %
Neutrophils Relative %: 73 %
Platelets: 48 10*3/uL — ABNORMAL LOW (ref 150–400)
Platelets: 46 10*3/uL — ABNORMAL LOW (ref 150–400)
RBC: 2.57 MIL/uL — ABNORMAL LOW (ref 3.87–5.11)
RBC: 2.58 MIL/uL — ABNORMAL LOW (ref 3.87–5.11)
RDW: 19.7 % — ABNORMAL HIGH (ref 11.5–15.5)
RDW: 20 % — ABNORMAL HIGH (ref 11.5–15.5)
WBC: 1.9 10*3/uL — ABNORMAL LOW (ref 4.0–10.5)
WBC: 1.9 10*3/uL — ABNORMAL LOW (ref 4.0–10.5)
nRBC: 0 % (ref 0.0–0.2)
nRBC: 0 % (ref 0.0–0.2)

## 2020-04-29 LAB — CULTURE, BLOOD (ROUTINE X 2)
Culture: NO GROWTH
Culture: NO GROWTH
Special Requests: ADEQUATE

## 2020-04-29 LAB — BASIC METABOLIC PANEL
Anion gap: 5 (ref 5–15)
BUN: 29 mg/dL — ABNORMAL HIGH (ref 6–20)
CO2: 23 mmol/L (ref 22–32)
Calcium: 7.8 mg/dL — ABNORMAL LOW (ref 8.9–10.3)
Chloride: 95 mmol/L — ABNORMAL LOW (ref 98–111)
Creatinine, Ser: 1.38 mg/dL — ABNORMAL HIGH (ref 0.44–1.00)
GFR, Estimated: 45 mL/min — ABNORMAL LOW (ref >60)
Glucose, Bld: 114 mg/dL — ABNORMAL HIGH (ref 70–99)
Potassium: 5.5 mmol/L — ABNORMAL HIGH (ref 3.5–5.1)
Sodium: 123 mmol/L — ABNORMAL LOW (ref 135–145)

## 2020-04-29 LAB — GLUCOSE, CAPILLARY
Glucose-Capillary: 115 mg/dL — ABNORMAL HIGH (ref 70–99)
Glucose-Capillary: 87 mg/dL (ref 70–99)
Glucose-Capillary: 117 mg/dL — ABNORMAL HIGH (ref 70–99)
Glucose-Capillary: 114 mg/dL — ABNORMAL HIGH (ref 70–99)
Glucose-Capillary: 91 mg/dL (ref 70–99)
Glucose-Capillary: 112 mg/dL — ABNORMAL HIGH (ref 70–99)

## 2020-04-29 LAB — HEMOGLOBIN AND HEMATOCRIT, BLOOD
HCT: 21.9 % — ABNORMAL LOW (ref 36.0–46.0)
HCT: 27.6 % — ABNORMAL LOW (ref 36.0–46.0)
Hemoglobin: 7.1 g/dL — ABNORMAL LOW (ref 12.0–15.0)
Hemoglobin: 8.8 g/dL — ABNORMAL LOW (ref 12.0–15.0)

## 2020-04-29 LAB — ABO/RH: ABO/RH(D): O POS

## 2020-04-29 LAB — PREPARE RBC (CROSSMATCH)

## 2020-04-29 LAB — MAGNESIUM: Magnesium: 1.8 mg/dL (ref 1.7–2.4)

## 2020-04-29 LAB — MRSA PCR SCREENING: MRSA by PCR: NEGATIVE

## 2020-04-29 LAB — POTASSIUM: Potassium: 5.4 mmol/L — ABNORMAL HIGH (ref 3.5–5.1)

## 2020-04-29 LAB — PHOSPHORUS: Phosphorus: 2.9 mg/dL (ref 2.5–4.6)

## 2020-04-29 MED ORDER — SODIUM CHLORIDE 0.9% IV SOLUTION: Freq: Once | INTRAVENOUS | Status: DC

## 2020-04-29 MED ORDER — ZINC OXIDE 40 % EX OINT
TOPICAL_OINTMENT | Freq: Every day | CUTANEOUS | Status: DC
  Filled 2020-04-29: qty 113

## 2020-04-29 MED ORDER — DEXTROSE 50 % IV SOLN
1.0000 | Freq: Once | INTRAVENOUS | Status: AC
  Administered 2020-04-29: 50 mL via INTRAVENOUS
  Filled 2020-04-29: qty 50

## 2020-04-29 MED ORDER — HYDROCERIN EX CREA
TOPICAL_CREAM | Freq: Every day | CUTANEOUS | Status: DC
  Filled 2020-04-29: qty 113

## 2020-04-29 MED ORDER — INSULIN ASPART 100 UNIT/ML IV SOLN
10.0000 [IU] | Freq: Once | INTRAVENOUS | Status: AC
  Administered 2020-04-29: 10 [IU] via INTRAVENOUS
  Filled 2020-04-29: qty 0.1

## 2020-04-29 MED ORDER — MAGNESIUM SULFATE 2 GM/50ML IV SOLN
2.0000 g | Freq: Once | INTRAVENOUS | Status: AC
  Administered 2020-04-29: 2 g via INTRAVENOUS
  Filled 2020-04-29: qty 50

## 2020-04-29 MED ORDER — SODIUM CHLORIDE 0.9% IV SOLUTION: Freq: Once | INTRAVENOUS | Status: AC

## 2020-04-29 MED ORDER — SODIUM BICARBONATE 650 MG PO TABS
650.0000 mg | ORAL_TABLET | Freq: Three times a day (TID) | ORAL | Status: DC
  Administered 2020-04-29 – 2020-05-01 (×7): 650 mg via ORAL
  Filled 2020-04-29 (×9): qty 1

## 2020-04-29 NOTE — Progress Notes (Signed)
PHARMACY CONSULT NOTE - FOLLOW UP  Pharmacy Consult for Electrolyte Monitoring and Replacement   Recent Labs: Potassium (mmol/L)  Date Value  04/29/2020 5.5 (H)  03/27/2014 3.5   Magnesium (mg/dL)  Date Value  04/29/2020 1.8  02/24/2012 2.4   Calcium (mg/dL)  Date Value  04/29/2020 7.8 (L)   Calcium, Total (mg/dL)  Date Value  03/27/2014 10.4 (H)   Albumin (g/dL)  Date Value  04/27/2020 2.4 (L)  03/27/2014 5.3 (H)   Phosphorus (mg/dL)  Date Value  04/29/2020 2.9   Sodium (mmol/L)  Date Value  04/29/2020 123 (L)  03/27/2014 142     Assessment: 55 y.o. female with extensive past medical history including severe psoriasis, currently on hospice, here with general fatigue.  The patient reportedly was only just released from a nursing home after recent hospital admission. Pharmacy has been consulted for electrolyte management.  MIVF: Na bicarb 142mq in dextrse @100mL /hr  Goal of Therapy:  Electrolytes WNL  Plan:   Na 123 - order from provider for NaCl tablets 2g BID  K 5.5 - orders from provider for 25.2g PO Veltassa  Mg 1.8 - orders from provider for 2g IV Mg sulfate   Re-check electrolytes with AM labs  SSherilyn Banker PharmD Pharmacy Resident  04/29/2020 6:40 AM

## 2020-04-29 NOTE — Progress Notes (Signed)
   Daily Progress Note   Patient Name: Lori Mckenzie       Date: 04/29/2020 DOB: 1965/04/16  Age: 55 y.o. MRN#: [id number] Attending Physician: Tyna Jaksch, MD Primary Care Physician: Pcp, No Admit Date: 04/24/2020  Reason for Consultation/Follow-up: Establishing goals of care  Subjective:  Patient awake and alert. She has some moments of clarity but also with noticeable intermittent confusion. She does complaint of generalized aches (most likely more exacerbated from recent fall).   Patient refusing lab work earlier this morning. Recently able to obtain.   No family at the bedside. Son updated. He provides confirmation of plan for patient to discharge home with her sister, Lori Mckenzie with ongoing hospice support. They are not interested in aggressive medical interventions.   Goals of care is to focus on patient's comfort, while treating the treatable during hospitalization. Patient and family remain hopeful she will discharge soon.   Length of Stay: 5 days  Vital Signs: BP (!) 102/56   Pulse 66   Temp 98 F (36.7 C) (Oral)   Resp 11   Ht 5' 2"  (1.575 m)   Wt 112 kg   SpO2 98%   BMI 45.16 kg/m  SpO2: SpO2: 98 % O2 Device: O2 Device: Room Air O2 Flow Rate:    Physical Exam: NAD, chronically ill appearing Hypotensive Normal breathing pattern AAO with some intermittent confusion           Palliative Care Assessment & Plan  HPI: Palliative Care consult requested for goals of care discussion in this 55 y.o. female with a medical history significant for hypertension, diabetes type 2, OSA, COPD, hypothyroidism, uterine cancer s/p partial hysterectomy, CKD, and NASH with refractory ascites s/p Pleurx catheter placement.  Patient presented to the ED after falling while shopping at [facility].  Upon arrival patient was disoriented, lethargic, weak, and hypertensive.  CT of head, abdomen, pelvis, maxillary, and cervical spine all negative for acute abnormalities.  Since admission  patient requiring Levophed.  Initiated on antibiotics.  Code Status:  DNR  Goals of Care/Recommendations:  Continue to treat the treatable. Patient refusing some labs and care earlier. In the setting of discharging home with hospice support if patient is declining treatment and interventions this is very much appropriate given no meaningful recovery and goal of focusing on comfort.   Goals clear and confirmed that patient will discharge home with her sister and with [facility] hospice in place.   PMT will continue to support and follow as needed.   Prognosis: < 6 months  Discharge Planning: Home with Hospice  Thank you for allowing the Palliative Medicine Team to assist in the care of this patient.  Time Total: 20 min.   Visit consisted of counseling and education dealing with the complex and emotionally intense issues of symptom management and palliative care in the setting of serious and potentially life-threatening illness.Greater than 50%  of this time was spent counseling and coordinating care related to the above assessment and plan.  Alda Lea, AGPCNP-BC  Palliative Medicine Team [phone]

## 2020-04-29 NOTE — Progress Notes (Signed)
Patient refused to have morning labs drawn. Patient stated that "the doctor already got labs, he isn't getting any more". Notified nurse practitioner Sharion Settler. Will continue patient care.

## 2020-04-29 NOTE — Progress Notes (Signed)
Patent transferred to unit from ICU, no acute distress noted. BP 87/51 patient  just received her midodrine and was type and screen for her blood. She is asymptomatic with low bp at this time. MD made aware.  Will continue to monitor.

## 2020-04-29 NOTE — Progress Notes (Signed)
NAME:  Lori Mckenzie, MRN:  [id number], DOB:  05-22-1965, LOS: 5 ADMISSION DATE:  04/24/2020, INITIAL CONSULTATION DATE: 04/24/20 REFERRING MD:  Dr. Ellender Hose, CHIEF COMPLAINT: Fall   Brief Patient Description  55 year old female with extensive history as below presenting to the ED with syncope and collapse found to be hypotensive, severely hypovolemic with suspected SBP and in AKI.  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 Hypertension T2DM COPD OSA -not on home oxygen Hypothyroidism Uterine cancer s/p partial hysterectomy CKD  Significant Hospital Events: Including procedures, antibiotic start and stop dates in addition to other pertinent events    04/24/20: Admit to ICU on vasopressor 4/23: GI consulted 4/25: Remains on Levophed (6 mcg), lowering MAP goal to 60 due to cirrhosis 4/26: Levophed weaned off overnight with MAP gaol of 60 did get 1 liter of Fluid removed overnight  Cultures:  4/22: SARS-CoV-2 PCR>> negative 4/22: Influenza PCR>> negative 4/22: Blood culture x2>>No growth thus far   Antimicrobials:  Ceftriaxone 4/22>>  Interim History / Subjective:  -Remains critically ill, requiring 6 mcg Levophed currently -Afebrile, on room air -Initial analysis of Peritoneal not consistent with SBP, doesn't appear cultures were sent ~ will continue empiric Rocephin (plan for 7 day course) -Measured urine output 125 last 24 hrs, however 4 documented voids with unmeasured ouput -Complaints of chronic pain  OBJECTIVE   Blood pressure 106/66, pulse (!) 58, temperature 98 F (36.7 C), temperature source Oral, resp. rate 11, height 5' 2"  (1.575 m), weight 112 kg, SpO2 99 %.        Intake/Output Summary (Last 24 hours) at 04/29/2020 0816 Last data filed at 04/29/2020 0400 Gross per 24 hour  Intake 2158.1 ml  Output 725 ml  Net 1433.1 ml   Filed Weights   04/27/20 0358 04/28/20 0421 04/29/20 0500  Weight: 106.6 kg 110.1 kg 112 kg     Examination: GENERAL: Chronically ill appearing 55 year-old patient, sitting in the bed, on room air, with no acute distress.  EYES: Pupils equal, round, reactive to light and accommodation. No scleral icterus. Extraocular muscles intact.  HEENT: Head atraumatic, normocephalic. Oropharynx and nasopharynx clear.  NECK:  Supple, no jugular venous distention. No thyroid enlargement, no tenderness.  LUNGS: Clear to auscultation bilaterally, no wheezing or rales noted, even, nonlabored, normal effort CARDIOVASCULAR: regular rate and rhythm, s1s2, no M/R/G, 2+ distal pulses ABDOMEN: Soft, nontender, slightly distended. Bowel sounds present. No organomegaly or mass. Pleurx catheter  in place EXTREMITIES: Normal bulk and tone, no deformities, 1+ pedal edema bilaterally NEUROLOGIC: Cranial nerves II through XII are intact. Muscle strength 5/5 in all extremities. Sensation intact. Gait not checked.  PSYCHIATRIC: Awake, alert and oriented x 3 (disoriented to situation).  SKIN:Open abrasion on right cheek with bruising, scattered ecchymosis bilateral upper arms    Labs/imaging that I havepersonally reviewed  (right click and "Reselect all SmartList Selections" daily)  Labs 04/27/20: Na 123, K 5.1, glucose 144, BUN 22, Cr. 1.08, Albumin 2.4, WBC 3.9, Hgb 8.9, Platelets 90 04/24/20 CT maxillofacial wo contrast >> mild right premolar soft tissue swelling, no acute facial bone fracture 04/24/2020 CT cervical spine without contrast >> straightening of the cervical spine with degenerative changes no acute osseous abnormality 04/24/2020 CT head/abdomen/pelvis without contrast >> no acute intracranial abnormality. Prior TIPS shunt placement with stable cirrhotic hepatic morphology with sequela of portal hypertension including stable splenomegaly, mild a sending portal colopathy and decreased moderate volume ascites status post peritoneal catheter placement. No acute  trauma noted CXR 04/24/2020: No active  disease  Labs   CBC: Recent Labs  Lab 04/25/20 0522 04/26/20 0513 04/27/20 0653 04/28/20 0502 04/29/20 0357  WBC 5.0 3.0* 3.9* 2.5* 1.9*  NEUTROABS  --  2.1  --   --  1.3*  HGB 9.1* 9.0* 8.9* 7.7* 6.9*  HCT 27.0* 26.3* 27.2* 23.8* 21.5*  MCV 78.5* 79.2* 81.9 82.1 83.7  PLT 91* 98* 90* 63* 48*    Basic Metabolic Panel: Recent Labs  Lab 04/25/20 0522 04/25/20 0817 04/26/20 0513 04/26/20 0813 04/27/20 0653 04/27/20 1151 04/28/20 0019 04/28/20 0502 04/28/20 0749 04/28/20 1146 04/29/20 0357  NA 126*   < > 123*  123*   < > 123*   < > 119* 122* 119* 119* 123*  K 5.0  --  4.9  --  5.1  --   --  5.4*  --  5.2* 5.5*  CL 99  --  97*  --  99  --   --  99  --  96* 95*  CO2 22  --  18*  --  20*  --   --  18*  --  18* 23  GLUCOSE 101*  --  147*  --  144*  --   --  92  --  122* 114*  BUN 40*  --  30*  --  22*  --   --  26*  --  27* 29*  CREATININE 1.45*  --  1.27*  --  1.08*  --   --  1.65*  --  1.51* 1.38*  CALCIUM 8.1*  --  8.1*  --  8.0*  --   --  7.8*  --  7.7* 7.8*  MG 2.2  --  2.1  --  1.9  --   --  1.8  --   --  1.8  PHOS 3.3  --  3.2  --  2.8  --   --  3.5  --   --  2.9   < > = values in this interval not displayed.   GFR: Estimated Creatinine Clearance: 53.8 mL/min (A) (by C-G formula based on SCr of 1.38 mg/dL (H)). Recent Labs  Lab 04/24/20 1840 04/24/20 2019 04/24/20 2313 04/25/20 0522 04/26/20 0513 04/27/20 0653 04/28/20 0502 04/29/20 0357  PROCALCITON  --   --  <0.10 <0.10 <0.10  --   --   --   WBC  --   --   --  5.0 3.0* 3.9* 2.5* 1.9*  LATICACIDVEN 1.4 1.8  --   --   --   --   --   --     Liver Function Tests: Recent Labs  Lab 04/24/20 1809 04/25/20 0522 04/26/20 0513 04/27/20 0653  AST 40 33 36 33  ALT 27 22 23 21   ALKPHOS 96 81 69 60  BILITOT 1.2 1.2 1.0 1.0  PROT 4.8* 4.6* 4.9* 4.5*  ALBUMIN 2.5* 2.5* 2.8* 2.4*   No results for input(s): LIPASE, AMYLASE in the last 168 hours. Recent Labs  Lab 04/24/20 2251 04/27/20 0351  AMMONIA 37*  39*    ABG    Component Value Date/Time   PHART 7.29 (L) 10/21/2016 1720   PCO2ART 65 (H) 10/21/2016 1720   PO2ART 69 (L) 10/21/2016 1720   HCO3 31.3 (H) 10/21/2016 1720   O2SAT 91.3 10/21/2016 1720     Coagulation Profile: Recent Labs  Lab 04/24/20 1809  INR 1.3*    Cardiac Enzymes: No results for input(s): CKTOTAL, CKMB,  CKMBINDEX, TROPONINI in the last 168 hours.  HbA1C: Hemoglobin A1C  Date/Time Value Ref Range Status  11/17/2013 04:16 AM 8.0 (H) 4.2 - 6.3 % Final    Comment:    The American Diabetes Association recommends that a primary goal of therapy should be <7% and that physicians should reevaluate the treatment regimen in patients with HbA1c values consistently >8%.   04/26/2013 04:39 PM 6.3 4.2 - 6.3 % Final    Comment:    The American Diabetes Association recommends that a primary goal of therapy should be <7% and that physicians should reevaluate the treatment regimen in patients with HbA1c values consistently >8%.    Hgb A1c MFr Bld  Date/Time Value Ref Range Status  04/25/2020 05:22 AM 5.9 (H) 4.8 - 5.6 % Final    Comment:    (NOTE) Pre diabetes:          5.7%-6.4%  Diabetes:              >6.4%  Glycemic control for   <7.0% adults with diabetes   06/12/2017 06:17 AM 9.0 (H) 4.8 - 5.6 % Final    Comment:    (NOTE) Pre diabetes:          5.7%-6.4% Diabetes:              >6.4% Glycemic control for   <7.0% adults with diabetes     CBG: Recent Labs  Lab 04/28/20 1514 04/28/20 2003 04/28/20 2314 04/29/20 0342 04/29/20 0746  GLUCAP 99 110* 111* 115* 87    Allergies Allergies  Allergen Reactions  . Aspirin     unknown  . Nsaids Other (See Comments)    Liver problems  . Tape Swelling  . Tylenol [Acetaminophen]     Not specified  . Vicodin [Hydrocodone-Acetaminophen] Itching and Rash     Home Medications  Prior to Admission medications   Medication Sig Start Date End Date Taking? Authorizing Provider  albuterol (PROVENTIL  HFA;VENTOLIN HFA) 108 (90 Base) MCG/ACT inhaler Inhale 2 puffs into the lungs every 6 (six) hours as needed for wheezing or shortness of breath.   Yes [provider]  albuterol (PROVENTIL) (2.5 MG/3ML) 0.083% nebulizer solution Take 2.5 mg by nebulization every 4 (four) hours as needed.   Yes [provider]  ALPRAZolam Duanne Moron) 0.5 MG tablet Take 0.5 mg by mouth every 4 (four) hours as needed. 04/23/20  Yes [provider]  citalopram (CELEXA) 10 MG tablet Take 10 mg by mouth daily. 11/27/18  Yes [provider]  diphenhydrAMINE (BENADRYL) 25 MG tablet Take 25 mg by mouth every 6 (six) hours as needed.   Yes [provider]  lactulose (CHRONULAC) 10 GM/15ML solution Take 30 mLs (20 g total) by mouth 3 (three) times daily. 10/26/16  Yes Gouru, Illene Silver, MD  morphine (MS CONTIN) 15 MG 12 hr tablet Take 3 tablets by mouth every 12 (twelve) hours. 04/23/20  Yes [provider]  nystatin (MYCOSTATIN/NYSTOP) powder Apply 1 g topically 3 (three) times daily as needed. For irritation 10/26/16  Yes Gouru, Aruna, MD  ondansetron (ZOFRAN-ODT) 4 MG disintegrating tablet Take 4 mg by mouth every 4 (four) hours as needed for nausea. 03/18/20  Yes [provider]  Oxycodone HCl 10 MG TABS Take 10 mg by mouth every 3 (three) hours as needed.   Yes [provider]  rizatriptan (MAXALT) 5 MG tablet Take 5 mg by mouth daily as needed. 06/28/17  Yes [provider]  ALPRAZolam Duanne Moron) 1  MG tablet Take 1 mg by mouth 3 (three) times daily as needed.  Patient not taking: Reported on 04/25/2020    [provider]  cholecalciferol (VITAMIN D) 1000 units tablet Take 2,000 Units by mouth daily. Patient not taking: No sig reported    [provider]  diclofenac sodium (VOLTAREN) 1 % GEL Apply 4 g topically every 8 (eight) hours as needed. Patient not taking: Reported on 04/25/2020 12/06/16 08/20/18  Milinda Pointer, MD  dicyclomine  (BENTYL) 10 MG capsule Take 10 mg by mouth 2 (two) times daily. Patient not taking: No sig reported 07/23/18   [provider]  fluticasone (FLONASE) 50 MCG/ACT nasal spray Place 1-2 sprays into both nostrils daily as needed for rhinitis.  Patient not taking: No sig reported    [provider]  Folate-B12-Intrinsic Factor (INTRINSI B12-FOLATE) 485-462-70 MCG-MCG-MG TABS Take 1 tablet by mouth daily.  Patient not taking: No sig reported    [provider]  gabapentin (NEURONTIN) 300 MG capsule Take 900 mg by mouth 3 (three) times daily.  Patient not taking: No sig reported    [provider]  insulin regular human CONCENTRATED (HUMULIN R) 500 UNIT/ML injection Inject 100-200 Units into the skin 2 (two) times a day. Inject 200 units in the morning, inject 100 units nightly. Based on glucose readings Patient not taking: No sig reported    [provider]  magnesium oxide (MAG-OX) 400 MG tablet Take 400 mg by mouth daily. Patient not taking: No sig reported    [provider]  pantoprazole (PROTONIX) 40 MG tablet Take 40 mg by mouth 2 (two) times daily before a meal. Patient not taking: No sig reported    [provider]  potassium chloride SA (K-DUR,KLOR-CON) 20 MEQ tablet Take 20 mEq by mouth daily as needed. For high blood pressure Patient not taking: No sig reported    [provider]  promethazine (PHENERGAN) 12.5 MG tablet Take 2 tablets (25 mg total) by mouth every 6 (six) hours as needed for nausea or vomiting. Patient not taking: No sig reported 09/25/16   Dustin Flock, MD  rifaximin (XIFAXAN) 550 MG TABS tablet Take 550 mg by mouth daily.  Patient not taking: No sig reported    [provider]  spironolactone (ALDACTONE) 100 MG tablet Take 400 mg by mouth daily.  Patient not taking: No sig reported    [provider]  vitamin B-12 (CYANOCOBALAMIN) 1000 MCG tablet Take 2,000 mcg by mouth  daily. Patient not taking: No sig reported    [provider]  SUMAtriptan (IMITREX) 50 MG tablet Take 50 mg by mouth every 2 (two) hours as needed. For migraines  03/17/11  [provider]   Scheduled Meds: . bacitracin   Topical BID  . Chlorhexidine Gluconate Cloth  6 each Topical Daily  . [START ON 05/01/2020] ciprofloxacin  500 mg Oral Daily  . insulin aspart  0-9 Units Subcutaneous Q4H  . lactulose  10 g Oral BID  . lidocaine  1 patch Transdermal Q24H  . midodrine  10 mg Oral TID WC  . morphine  45 mg Oral Q12H  . pantoprazole (PROTONIX) IV  40 mg Intravenous QHS  . patiromer  25.2 g Oral Daily  . rifaximin  200 mg Oral BID  . sodium bicarbonate  650 mg Oral TID  . sodium chloride  2 g Oral BID WC   Continuous Infusions: . cefTRIAXone (ROCEPHIN)  IV Stopped (04/29/20 0004)  . norepinephrine (LEVOPHED)  Adult infusion Stopped (04/27/20 1808)   PRN Meds:.ALPRAZolam, docusate sodium, ipratropium-albuterol, morphine injection, ondansetron, oxyCODONE  Resolved Hospital Problem list   Hypokalemia  ASSESSMENT & PLAN   Syncope and collapse Hypotension due suspected hypovolemia in the setting of malnutrition Hypoalbuminemia -Continuous cardiac monitoring -Maintain MAP >60 given Cirrhosis -Continue Midodrine 10 mg TID -Echocardiogram 04/25/20 with LVEF 10-25%, grade I Diastolic dysfunction  Suspected Spontaneous Bacterial Peritonitis PMHx: 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 Patient received empiric ABX in ED: flagyl, cefepime, vancomycin. PleurX catheter placed 03/19/20. Sister reports RN came and drained some fluid earlier on 04/24/20. -Continue empiric Ceftriaxone (plan for 7 day course) -S/p Diagnostic Paracentesis 4/23: Analysis of Peritoneal fluid not consistent with SBP (Total Nucleated Cells 179, Neutrophil count 44, albumin <1.0, glucose 157, LD 20, total protein <3.0, amylase  9) -GI following, appreciate input -Continue Rocephin for empiric treatment of SBP.  When finished would start ciprofloxacin for prophylactic dosing. -Can drain fluid every 12 hours as needed 2 liters at a time for discomfort  # Hepatic Encephalopathy>>improving Chronic pain syndrome followed by outpatient hospice PMHx: PTSD, depression and anxiety - Lactulose titrated to 3-4BM/day, - Rifaximin 543m PO bid - supportive care - avoid sedating medications as tolerated - Emphasize sleep/wake cycle - Pain management - palliative care consulted, appreciate input  Acute Kidney Injury secondary to suspected hypovolemia in the setting of malnutrition>> Did improve with albumin suggesting pre renal etiology.  Hyperkalemia- Stable Baseline Cr: 0.6, Cr on admission: 1.89.  Has increased to current creatinine which is 1.65 Acute on Chronic Hyponatremia- 123 Patient's baseline sodium over the last 2 years is 130. Suspect this is hypovolemic hyponatremia in the setting of poor PO intake. Patient is slightly altered on admission and received 2 L NS bolus in ED. -Monitor I&O's / urinary output -Follow BMP every 6 -Continue Veltassa -Sodium tablets initiated. -Start bicarb tablets -Avoid nephrotoxic agents as able -Replace electrolytes as indicated  T2DM - Q 4 CBG monitoring - SSI sensitive d/t to poor PO intake - follow ICU hypo/hyper glycemia protocol  COPD without evidence of acute exacerbation PMHx: OSA, asthma Not on home O2 - bronchodilators PRN - supplemental O2 PRN to maintain SpO2 > 90% -CXR and ABG as needed   Thrombocytopenia-Noted. Doubt the validity of this lab draw as all cell lines have dropped -Repeat CBC in afternoon.  -Prophylactic heparin held  Best practice (right click and "Reselect all SmartList Selections" daily)  Diet:  Oral Pain/Anxiety/Delirium protocol (if indicated): Yes VAP protocol (if indicated): Not indicated DVT prophylaxis: Subcutaneous Heparin held  because platelet drop GI prophylaxis: PPI Glucose control:  SSI Yes Central venous access:  N/A Arterial line:  N/A Foley:  N/A Mobility:  bed rest  PT consulted: N/A Last date of multidisciplinary goals of care discussion [04/27/2020] Code Status:  DNR Disposition: stepdown  Critical care time: 40 minutes

## 2020-04-29 NOTE — Progress Notes (Signed)
[place] [facility] [place] ([place] patient RN note:  Lori Mckenzie is a current hospice patient with a terminal diagnosis of nonalcoholic steatohepatitis. On 04.22, she fell in the [place] parking lot and her sister drove her to the emergency room at [facility]. Patient was found to be hypotensive and started on IV fluids and Levophed. Family then contacted Hospice. Patient was admitted to [facility] on 04.22 at 2232 with a diagnosis of renal and liver failure. She is a DNR. Per Dr. Gilford Rile, with [facility], this is a related admission.  Visited with patient at bedside. No family present. She was alert and pleasant with periods of confusing speech. She voiced no complaints and stated she was ready to go home to live with her sister. Report exchanged with hospital team.  Vital Signs: BP 102/56; HR 66; Resp 11; Temp 98.0; O2 sat 99% on RA  I & O: 2127m/725ml  Abnomal labs: WBC: 1.9 (L) RBC: 2.58 (L) Hemoglobin: 6.8 (L) HCT: 21.5 (L) RDW: 20.0 (H) Platelets: 46 (L) NEUT#: 1.4 (L) Lymphocyte #: 0.2 (L)  Diagnostics: None new  IV/PRN Meds: pantoprazole (PROTONIX) injection 40 mg Dose: 40 mg Freq: Daily at bedtime Route: IV Start: 04/24/20 2245  cefTRIAXone (ROCEPHIN) 2 g in sodium chloride 0.9 % 100 mL IVPB Dose: 2 g Freq: Every 24 hours Route: IV Last Dose: Stopped (04/29/20 0004) Start: 04/24/20 2315 End: 05/01/20 2314  ALPRAZolam (XANAX) tablet 0.5 mg Dose: 0.5 mg Freq: Every 4 hours PRN Route: PO PRN Reason: anxiety Start: 04/26/20 1254  morphine 2 MG/ML injection 2 mg Dose: 2 mg Freq: Every 4 hours PRN Route: IV PRN Reason: severe pain Start: 04/25/20 1400  Problem List: Syncope and collapse Hypotension due suspected hypovolemia in the setting of malnutrition Hypoalbuminemia -Continuous cardiac monitoring -Maintain MAP >60 given Cirrhosis -Continue Midodrine 10 mg TID -Echocardiogram 04/25/20 with LVEF 60-65%, grade I  Diastolic dysfunction  Suspected Spontaneous Bacterial Peritonitis 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 Patient received empiric ABX in ED: flagyl,cefepime, vancomycin. PleurX catheter placed 03/19/20. Sister reports RN came and drained some fluid earlier on 04/24/20. -Continue empiric Ceftriaxone (plan for 7 day course) -S/p Diagnostic Paracentesis 4/23: Analysis of Peritoneal fluid not consistent with SBP (Total Nucleated Cells 179, Neutrophil count 44, albumin <1.0, glucose 157, LD 20, total protein <3.0, amylase 9) -GI following, appreciate input -Continue Rocephin for empiric treatment of SBP.  When finished would start ciprofloxacin for prophylactic dosing. -Can drain fluid every 12 hours as needed 2 liters at a time for discomfort  # Hepatic Encephalopathy>>improving Chronic pain syndrome followed by outpatient hospice PMHx: PTSD, depression and anxiety - Lactulose titrated to 3-4BM/day, - Rifaximin 5541mPO bid - supportive care - avoid sedating medications as tolerated - Emphasize sleep/wake cycle - Pain management - palliative care consulted, appreciate input  Acute Kidney Injury secondary tosuspected hypovolemiain the setting ofmalnutrition>> Did improve with albumin suggesting pre renal etiology.  Hyperkalemia-Stable Baseline Cr:0.6, Cr on admission:1.89.  Has increased to current creatinine which is 1.65 Acute on Chronic Hyponatremia- 123 Patient's baseline sodium over the last 2 years is 130. Suspect this is hypovolemic hyponatremia in the setting of poor PO intake. Patient is slightly altered on admission and received 2 L NS bolus in ED. -Monitor I&O's / urinary output -Follow BMP every 6 -Continue Veltassa -Sodium tablets initiated. -Start bicarb tablets -Avoid nephrotoxic agents as able -Replace electrolytes as indicated  T2DM - Q 4  CBG monitoring - SSI sensitive d/t  to poor PO intake - follow ICU hypo/hyper glycemia protocol  COPD without evidence of acute exacerbation PMHx: OSA, asthma Not on home O2 - bronchodilators PRN - supplemental O2 PRN to maintain SpO2 > 90% -CXR and ABG as needed  Thrombocytopenia-Noted. Doubt the validity of this lab draw as all cell lines have dropped -Repeat CBC in afternoon.  -Prophylactic heparin held  Discharge Planning: Will discharge to home with sister tomorrow.  Family contact: none  IDG: Updated  Goals of care: Clear; Palliative consult today  Patient remains GIP for IV Ceftriaxone for bacterial Peritonitis  Please call with any hospice related questions or concerns.  Zandra Abts, RN [facility] Liaison  [phone]

## 2020-04-29 NOTE — Progress Notes (Signed)
   04/29/20 1315  Clinical Encounter Type  Visited With Patient  Visit Type Follow-up  Referral From Chaplain  Consult/Referral To Chaplain   Chaplain did a follow up with PT to see if this was a suitable time to have AD notarized. PT seemed a little disoriented but remember who the Chaplain was. She stated her daughter took the paperwork home. PT was able to express her feelings. Chaplain will follow up with PT again tomorrow. She stated her daughter may be in around noon tomorrow.

## 2020-04-30 DIAGNOSIS — E871 Hypo-osmolality and hyponatremia: Secondary | ICD-10-CM

## 2020-04-30 DIAGNOSIS — K7460 Unspecified cirrhosis of liver: Secondary | ICD-10-CM | POA: Diagnosis not present

## 2020-04-30 DIAGNOSIS — G894 Chronic pain syndrome: Secondary | ICD-10-CM | POA: Diagnosis not present

## 2020-04-30 DIAGNOSIS — W19XXXA Unspecified fall, initial encounter: Secondary | ICD-10-CM | POA: Diagnosis not present

## 2020-04-30 DIAGNOSIS — A419 Sepsis, unspecified organism: Secondary | ICD-10-CM | POA: Diagnosis not present

## 2020-04-30 LAB — TYPE AND SCREEN
ABO/RH(D): O POS
Antibody Screen: NEGATIVE
Unit division: 0

## 2020-04-30 LAB — CBC WITH DIFFERENTIAL/PLATELET
Abs Immature Granulocytes: 0.01 10*3/uL (ref 0.00–0.07)
Basophils Absolute: 0 10*3/uL (ref 0.0–0.1)
Basophils Relative: 1 %
Eosinophils Absolute: 0.1 10*3/uL (ref 0.0–0.5)
Eosinophils Relative: 3 %
HCT: 24.7 % — ABNORMAL LOW (ref 36.0–46.0)
Hemoglobin: 7.9 g/dL — ABNORMAL LOW (ref 12.0–15.0)
Immature Granulocytes: 1 %
Lymphocytes Relative: 12 %
Lymphs Abs: 0.3 10*3/uL — ABNORMAL LOW (ref 0.7–4.0)
MCH: 27 pg (ref 26.0–34.0)
MCHC: 32 g/dL (ref 30.0–36.0)
MCV: 84.3 fL (ref 80.0–100.0)
Monocytes Absolute: 0.4 10*3/uL (ref 0.1–1.0)
Monocytes Relative: 17 %
Neutro Abs: 1.4 10*3/uL — ABNORMAL LOW (ref 1.7–7.7)
Neutrophils Relative %: 66 %
Platelets: 49 10*3/uL — ABNORMAL LOW (ref 150–400)
RBC: 2.93 MIL/uL — ABNORMAL LOW (ref 3.87–5.11)
RDW: 19.2 % — ABNORMAL HIGH (ref 11.5–15.5)
WBC: 2 10*3/uL — ABNORMAL LOW (ref 4.0–10.5)
nRBC: 0 % (ref 0.0–0.2)

## 2020-04-30 LAB — BPAM RBC
Blood Product Expiration Date: 202205242359
ISSUE DATE / TIME: 202204271813
Unit Type and Rh: 5100

## 2020-04-30 LAB — GLUCOSE, CAPILLARY
Glucose-Capillary: 111 mg/dL — ABNORMAL HIGH (ref 70–99)
Glucose-Capillary: 118 mg/dL — ABNORMAL HIGH (ref 70–99)
Glucose-Capillary: 92 mg/dL (ref 70–99)
Glucose-Capillary: 155 mg/dL — ABNORMAL HIGH (ref 70–99)
Glucose-Capillary: 113 mg/dL — ABNORMAL HIGH (ref 70–99)

## 2020-04-30 LAB — MAGNESIUM: Magnesium: 2.1 mg/dL (ref 1.7–2.4)

## 2020-04-30 LAB — BASIC METABOLIC PANEL
Anion gap: 5 (ref 5–15)
BUN: 28 mg/dL — ABNORMAL HIGH (ref 6–20)
CO2: 23 mmol/L (ref 22–32)
Calcium: 8 mg/dL — ABNORMAL LOW (ref 8.9–10.3)
Chloride: 99 mmol/L (ref 98–111)
Creatinine, Ser: 1.28 mg/dL — ABNORMAL HIGH (ref 0.44–1.00)
GFR, Estimated: 49 mL/min — ABNORMAL LOW (ref >60)
Glucose, Bld: 101 mg/dL — ABNORMAL HIGH (ref 70–99)
Potassium: 5.2 mmol/L — ABNORMAL HIGH (ref 3.5–5.1)
Sodium: 127 mmol/L — ABNORMAL LOW (ref 135–145)

## 2020-04-30 LAB — PHOSPHORUS: Phosphorus: 3.1 mg/dL (ref 2.5–4.6)

## 2020-04-30 MED ORDER — CIPROFLOXACIN HCL 500 MG PO TABS: 500.0000 mg | ORAL_TABLET | Freq: Every day | ORAL | 0 refills | Status: DC

## 2020-04-30 MED ORDER — INSULIN ASPART 100 UNIT/ML ~~LOC~~ SOLN: 0.0000 [IU] | Freq: Three times a day (TID) | SUBCUTANEOUS | Status: DC

## 2020-04-30 NOTE — Progress Notes (Deleted)
Patient requesting to leave AMA. Physician team aware patient will be leaving AMA. AMA form signed by patient and placed in chart.   Fuller Mandril, RN

## 2020-04-30 NOTE — Plan of Care (Signed)
  Problem: Clinical Measurements: Goal: Ability to maintain clinical measurements within normal limits will improve Outcome: Progressing Goal: Will remain free from infection Outcome: Progressing Goal: Diagnostic test results will improve Outcome: Progressing Goal: Respiratory complications will improve Outcome: Progressing Goal: Cardiovascular complication will be avoided Outcome: Progressing   Problem: Pain Managment: Goal: General experience of comfort will improve Outcome: Progressing   Pt is alert and orientedx2. V/S stable. Had 1 unit of blood; no transfusion reaction noted. Complained of abdominal pain on her right side; sched morphine given. FC in place; draining well. Pleurx cath in place; dressing intact. Fall prec kept in place.

## 2020-04-30 NOTE — Progress Notes (Signed)
[place] [facility] [place] ([place] patient RN note:  Darnita Woodrum is a current hospice patient with a terminal diagnosis of nonalcoholic steatohepatitis. On 04.22, she fell in the [place] parking lot and her sister drove her to the emergency room at [facility]. Patient was found to be hypotensive and started on IV fluids and Levophed. Family then contacted Hospice. Patient was admitted to [facility] on 04.22 at 2232 with a diagnosis of renal and liver failure. She is a DNR. Per Dr. Gilford Rile, with [facility], this is a related admission.  Visited with patient at bedside. No family present. She was alert and disoriented. She denied any complaints and voiced that she wanted to go home. Report exchanged with hospital care team. Plan is to discharge when she is medically optimized. Spoke with sister, [name] over the phone to provide update.  Vital signs: BP 145/64; HR 71; Resp 20; Temp 98.3; O 2 sat 99% on RA  I & O: 767m/2000ml  Abnormal labs: Sodium: 127 (L) Potassium: 5.2 (H) Glucose: 101 (H) BUN: 28 (H) Creatinine: 1.28 (H) Calcium: 8.0 (L) GFR, Estimated: 49 (L) WBC: 2.0 (L) RBC: 2.93 (L) Hemoglobin: 7.9 (L) HCT: 24.7 (L) RDW: 19.2 (H) Platelets: 49 (L) NEUT#: 1.4 (L) Lymphocyte #: 0.3 (L)  Diagnostics: none new  IV/PRN Meds: pantoprazole (PROTONIX) injection 40 mg Dose: 40 mg Freq: Daily at bedtime Route: IV Start: 04/24/20 2245  cefTRIAXone (ROCEPHIN) 2 g in sodium chloride 0.9 % 100 mL IVPB Dose: 2 g Freq: Every 24 hours Route: IV Last Dose: 2 g (04/30/20 0035) Start: 04/24/20 2315 End: 05/01/20 2314  albumin human 25 % solution 12.5 g Dose: 12.5 g Freq: Every 6 hours Route: IV Last Dose: Stopped (04/30/20 0746) Start: 04/28/20 1200 End: 04/29/20 0811  cefTRIAXone (ROCEPHIN) 2 g in sodium chloride 0.9 % 100 mL IVPB Dose: 2 g Freq: Every 24 hours Route: IV Last Dose: 2 g (04/30/20 0035) Start: 04/24/20 2315 End:  05/01/20 2314  magnesium sulfate IVPB 2 g 50 mL Dose: 2 g Freq: Once Route: IV Last Dose: Stopped (04/30/20 0748) Start: 04/29/20 0600 End: 04/30/20 0748  Problem List: Syncope and collapse Hypotension due suspected hypovolemia in the setting of malnutrition Hypoalbuminemia -Continuous cardiac monitoring -Maintain MAP >60 given Cirrhosis -Continue Midodrine 10 mg TID -Echocardiogram 04/25/20 with LVEF 640-08% grade I Diastolic dysfunction  Suspected Spontaneous Bacterial Peritonitis 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 Patient received empiric ABX in ED: flagyl,cefepime, vancomycin. PleurX catheter placed 03/19/20. Sister reports RN came and drained some fluid earlier on 04/24/20. -Continue empiric Ceftriaxone (plan for 7 day course) -S/p Diagnostic Paracentesis 4/23: Analysis of Peritoneal fluid not consistent with SBP (Total Nucleated Cells 179, Neutrophil count 44, albumin <1.0, glucose 157, LD 20, total protein <3.0, amylase 9) -GI following, appreciate input -Continue Rocephin for empiric treatment of SBP.  When finished would start ciprofloxacin for prophylactic dosing. -Can drain fluid every 12 hours as needed 2 liters at a time for discomfort  # Hepatic Encephalopathy>>improving Chronic pain syndrome followed by outpatient hospice PMHx: PTSD, depression and anxiety - Lactulose titrated to 3-4BM/day, - Rifaximin 5569mPO bid - supportive care - avoid sedating medications as tolerated - Emphasize sleep/wake cycle - Pain management - palliative care consulted, appreciate input  Acute Kidney Injury secondary tosuspected hypovolemiain the setting ofmalnutrition>> Did improve with albumin suggesting pre renal etiology.  Hyperkalemia-Stable Baseline Cr:0.6, Cr on admission:1.89.  Has increased to current  creatinine which is 1.65 Acute on Chronic Hyponatremia- 123 Patient's  baseline sodium over the last 2 years is 130. Suspect this is hypovolemic hyponatremia in the setting of poor PO intake. Patient is slightly altered on admission and received 2 L NS bolus in ED. -Monitor I&O's / urinary output -Follow BMP every 6 -Continue Veltassa -Sodium tablets initiated. -Start bicarb tablets -Avoid nephrotoxic agents as able -Replace electrolytes as indicated  T2DM - Q 4 CBG monitoring - SSI sensitive d/t to poor PO intake - follow ICU hypo/hyper glycemia protocol  COPD without evidence of acute exacerbation PMHx: OSA, asthma Not on home O2 - bronchodilators PRN - supplemental O2 PRN to maintain SpO2 > 90% -CXR and ABG as needed  Thrombocytopenia-Noted. Doubt the validity of this lab draw as all cell lines have dropped -Repeat CBC in afternoon.  -Prophylactic heparin held  Discharge Planning: plan is to discharge home tomorrow with sister  Family contact: spoke with sister, [name] over the phone  IDG: Updated  Goals of care: clear  Patient remains GIP due to need to be medically optimized prior to discharge.  Please call with any hospice related questions or concerns.  Zandra Abts, RN [facility] Liaison [phone]

## 2020-04-30 NOTE — Progress Notes (Signed)
     Chart Reviewed. Updates Received. Patient Assessed.   Patient sitting upright in bed. Denies pain or discomfort. Is alert and oriented x3, with noticeable confusion at times.   States she is ready to go home and able to summarize the plan of returning home with her sister [name] and support from her son. She acknowledges plans for continued hospice support expressing she is not interested in further aggressive work-up but to focus on her comfort and what time she has left.   Family has been updated and goals remain clear for patient to discharge home with sister once appropriate. Family understands patient's current illness and co-morbidities and once home the goal is to focus on her comfort allowing patient to spend what time she has left with family.   Patient is anxious about discharging home and is hopeful it will be today. Advise at the latest may be tomorrow.   It is appropriate for patient to be discharge once attending agrees given she is going home with hospice and request no aggressive interventions and work-up.  She and family aware of no meaningful recovery and patient is most likely as stable as she will get given her co-morbidities.   It is appropriate to delay or discontinue treatments in the event patient refuses to align with expressed wishes and goals for comfort.   All questions answered and support provided.    Assessment -NAD -RRR -clear bilaterally  -soft, nontender, slight distention, Pleurx Cath in place -AAO x4, mood appropriate  Plan -Pending discharge home with hospice support via [facility] -Treat the treatable with no escalation of care. Goal of comfort at discharge.  -PMT will continue to support as needed.   Time Total: 25 min  Visit consisted of counseling and education dealing with the complex and emotionally intense issues of symptom management and palliative care in the setting of serious and potentially life-threatening illness.Greater  than 50%  of this time was spent counseling and coordinating care related to the above assessment and plan.  Alda Lea, AGPCNP-BC  Palliative Medicine Team [phone]

## 2020-04-30 NOTE — Progress Notes (Signed)
NAME:  Lori Mckenzie, MRN:  [id number], DOB:  04-26-1965, LOS: 6 ADMISSION DATE:  04/24/2020, INITIAL CONSULTATION DATE: 04/24/20 REFERRING MD:  Dr. Ellender Hose, CHIEF COMPLAINT: Fall   Brief Patient Description  55 year old female with extensive history as below presenting to the ED with syncope and collapse found to be hypotensive, severely hypovolemic with suspected SBP and in AKI.  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 Hypertension T2DM COPD OSA -not on home oxygen Hypothyroidism Uterine cancer s/p partial hysterectomy CKD  Significant Hospital Events: Including procedures, antibiotic start and stop dates in addition to other pertinent events    04/24/20: Admit to ICU on vasopressor 4/23: GI consulted 4/25: Remains on Levophed (6 mcg), lowering MAP goal to 60 due to cirrhosis 4/26: Levophed weaned off overnight with MAP gaol of 60 did get 1 liter of Fluid removed overnight 4/28: Transferred to [place] unit yesterday, wanting to be discharged although not medically optimized at this time, pt plans to leave AMA  Cultures:  4/22: SARS-CoV-2 PCR>> negative 4/22: Influenza PCR>> negative 4/22: Blood culture x2>>No growth    Antimicrobials:  Ceftriaxone 4/22>>4/28 Ciprofloxacin (plan to start 4/29)>>  Interim History / Subjective:  -Pt transferred to Med-Surg unit yesterday -Pt is very adamant on going home today -Pt is not medically optimized for discharge (remains on Midodrine, AKI, Hyponatremia, Hyperkalemia, pancytopenia) -Plans to leave AMA -Afebrile, hemodynamically stable, on room air  OBJECTIVE   Blood pressure (!) 145/64, pulse 71, temperature 98.3 F (36.8 C), resp. rate 20, height 5' 2"  (1.575 m), weight 112.5 kg, SpO2 99 %.        Intake/Output Summary (Last 24 hours) at 04/30/2020 1244 Last data filed at 04/30/2020 0800 Gross per 24 hour  Intake 753.33 ml  Output 750 ml  Net 3.33 ml   Filed Weights   04/28/20  0421 04/29/20 0500 04/30/20 0500  Weight: 110.1 kg 112 kg 112.5 kg    Examination: GENERAL: Chronically ill appearing 55 year-old patient, sitting in the bed, on room air, with no acute distress.  EYES: Pupils equal, round, reactive to light and accommodation. No scleral icterus. Extraocular muscles intact.  HEENT: Head atraumatic, normocephalic. Oropharynx and nasopharynx clear.  NECK:  Supple, no jugular venous distention. No thyroid enlargement, no tenderness.  LUNGS: Fine crackles to bases bilaterally otherwise clear, no wheezing, even, nonlabored, normal effort CARDIOVASCULAR: regular rate and rhythm, s1s2, no M/R/G, 2+ distal pulses ABDOMEN: Soft, nontender, slightly distended. Bowel sounds present. No organomegaly or mass. Pleurx catheter  in place EXTREMITIES: Normal bulk and tone, no deformities, 1+ pedal edema bilaterally NEUROLOGIC: Cranial nerves II through XII are intact. Muscle strength 5/5 in all extremities. Sensation intact. Gait not checked.  PSYCHIATRIC: Awake, alert and oriented x 4 SKIN:Open abrasion on right cheek with bruising, scattered ecchymosis bilateral upper arms    Labs/imaging that I havepersonally reviewed  (right click and "Reselect all SmartList Selections" daily)  Labs 04/30/20: Na 127, K 5.2, BUN 28, AKI 1.28, WBC 2.0, Hgb 7.9, platelets 49 04/24/20 CT maxillofacial wo contrast >> mild right premolar soft tissue swelling, no acute facial bone fracture 04/24/2020 CT cervical spine without contrast >> straightening of the cervical spine with degenerative changes no acute osseous abnormality 04/24/2020 CT head/abdomen/pelvis without contrast >> no acute intracranial abnormality. Prior TIPS shunt placement with stable cirrhotic hepatic morphology with sequela of portal hypertension including stable splenomegaly, mild a sending portal colopathy and decreased moderate volume ascites status post peritoneal catheter placement. No acute  trauma noted CXR 04/24/2020: No  active disease  Labs   CBC: Recent Labs  Lab 04/26/20 0513 04/27/20 0653 04/28/20 0502 04/29/20 0357 04/29/20 1002 04/29/20 1302 04/29/20 2309 04/30/20 0417  WBC 3.0* 3.9* 2.5* 1.9*  --  1.9*  --  2.0*  NEUTROABS 2.1  --   --  1.3*  --  1.4*  --  1.4*  HGB 9.0* 8.9* 7.7* 6.9* 7.1* 6.8* 8.8* 7.9*  HCT 26.3* 27.2* 23.8* 21.5* 21.9* 21.5* 27.6* 24.7*  MCV 79.2* 81.9 82.1 83.7  --  83.3  --  84.3  PLT 98* 90* 63* 48*  --  46*  --  49*    Basic Metabolic Panel: Recent Labs  Lab 04/26/20 0513 04/26/20 0813 04/27/20 0653 04/27/20 1151 04/28/20 0502 04/28/20 0749 04/28/20 1146 04/29/20 0357 04/29/20 1002 04/30/20 0417  NA 123*  123*   < > 123*   < > 122* 119* 119* 123*  --  127*  K 4.9  --  5.1  --  5.4*  --  5.2* 5.5* 5.4* 5.2*  CL 97*  --  99  --  99  --  96* 95*  --  99  CO2 18*  --  20*  --  18*  --  18* 23  --  23  GLUCOSE 147*  --  144*  --  92  --  122* 114*  --  101*  BUN 30*  --  22*  --  26*  --  27* 29*  --  28*  CREATININE 1.27*  --  1.08*  --  1.65*  --  1.51* 1.38*  --  1.28*  CALCIUM 8.1*  --  8.0*  --  7.8*  --  7.7* 7.8*  --  8.0*  MG 2.1  --  1.9  --  1.8  --   --  1.8  --  2.1  PHOS 3.2  --  2.8  --  3.5  --   --  2.9  --  3.1   < > = values in this interval not displayed.   GFR: Estimated Creatinine Clearance: 58.2 mL/min (A) (by C-G formula based on SCr of 1.28 mg/dL (H)). Recent Labs  Lab 04/24/20 1840 04/24/20 2019 04/24/20 2313 04/25/20 0522 04/26/20 0513 04/27/20 9480 04/28/20 0502 04/29/20 0357 04/29/20 1302 04/30/20 0417  PROCALCITON  --   --  <0.10 <0.10 <0.10  --   --   --   --   --   WBC  --   --   --  5.0 3.0*   < > 2.5* 1.9* 1.9* 2.0*  LATICACIDVEN 1.4 1.8  --   --   --   --   --   --   --   --    < > = values in this interval not displayed.    Liver Function Tests: Recent Labs  Lab 04/24/20 1809 04/25/20 0522 04/26/20 0513 04/27/20 0653  AST 40 33 36 33  ALT 27 22 23 21   ALKPHOS 96 81 69 60  BILITOT 1.2 1.2 1.0 1.0   PROT 4.8* 4.6* 4.9* 4.5*  ALBUMIN 2.5* 2.5* 2.8* 2.4*   No results for input(s): LIPASE, AMYLASE in the last 168 hours. Recent Labs  Lab 04/24/20 2251 04/27/20 0351  AMMONIA 37* 39*    ABG    Component Value Date/Time   PHART 7.29 (L) 10/21/2016 1720   PCO2ART 65 (H) 10/21/2016 1720   PO2ART 69 (L) 10/21/2016 1720  HCO3 31.3 (H) 10/21/2016 1720   O2SAT 91.3 10/21/2016 1720     Coagulation Profile: Recent Labs  Lab 04/24/20 1809  INR 1.3*    Cardiac Enzymes: No results for input(s): CKTOTAL, CKMB, CKMBINDEX, TROPONINI in the last 168 hours.  HbA1C: Hemoglobin A1C  Date/Time Value Ref Range Status  11/17/2013 04:16 AM 8.0 (H) 4.2 - 6.3 % Final    Comment:    The American Diabetes Association recommends that a primary goal of therapy should be <7% and that physicians should reevaluate the treatment regimen in patients with HbA1c values consistently >8%.   04/26/2013 04:39 PM 6.3 4.2 - 6.3 % Final    Comment:    The American Diabetes Association recommends that a primary goal of therapy should be <7% and that physicians should reevaluate the treatment regimen in patients with HbA1c values consistently >8%.    Hgb A1c MFr Bld  Date/Time Value Ref Range Status  04/25/2020 05:22 AM 5.9 (H) 4.8 - 5.6 % Final    Comment:    (NOTE) Pre diabetes:          5.7%-6.4%  Diabetes:              >6.4%  Glycemic control for   <7.0% adults with diabetes   06/12/2017 06:17 AM 9.0 (H) 4.8 - 5.6 % Final    Comment:    (NOTE) Pre diabetes:          5.7%-6.4% Diabetes:              >6.4% Glycemic control for   <7.0% adults with diabetes     CBG: Recent Labs  Lab 04/29/20 1958 04/30/20 0114 04/30/20 0337 04/30/20 0759 04/30/20 1150  GLUCAP 112* 111* 118* 92 155*    Allergies Allergies  Allergen Reactions  . Aspirin     unknown  . Nsaids Other (See Comments)    Liver problems  . Tape Swelling  . Tylenol [Acetaminophen]     Not specified  . Vicodin  [Hydrocodone-Acetaminophen] Itching and Rash     Home Medications  Prior to Admission medications   Medication Sig Start Date End Date Taking? Authorizing Provider  albuterol (PROVENTIL HFA;VENTOLIN HFA) 108 (90 Base) MCG/ACT inhaler Inhale 2 puffs into the lungs every 6 (six) hours as needed for wheezing or shortness of breath.   Yes [provider]  albuterol (PROVENTIL) (2.5 MG/3ML) 0.083% nebulizer solution Take 2.5 mg by nebulization every 4 (four) hours as needed.   Yes [provider]  ALPRAZolam Duanne Moron) 0.5 MG tablet Take 0.5 mg by mouth every 4 (four) hours as needed. 04/23/20  Yes [provider]  citalopram (CELEXA) 10 MG tablet Take 10 mg by mouth daily. 11/27/18  Yes [provider]  diphenhydrAMINE (BENADRYL) 25 MG tablet Take 25 mg by mouth every 6 (six) hours as needed.   Yes [provider]  lactulose (CHRONULAC) 10 GM/15ML solution Take 30 mLs (20 g total) by mouth 3 (three) times daily. 10/26/16  Yes Gouru, Illene Silver, MD  morphine (MS CONTIN) 15 MG 12 hr tablet Take 3 tablets by mouth every 12 (twelve) hours. 04/23/20  Yes [provider]  nystatin (MYCOSTATIN/NYSTOP) powder Apply 1 g topically 3 (three) times daily as needed. For irritation 10/26/16  Yes Gouru, Aruna, MD  ondansetron (ZOFRAN-ODT) 4 MG disintegrating tablet Take 4 mg by mouth every 4 (four) hours as needed for nausea. 03/18/20  Yes [provider]  Oxycodone HCl 10 MG TABS Take 10 mg by  mouth every 3 (three) hours as needed.   Yes [provider]  rizatriptan (MAXALT) 5 MG tablet Take 5 mg by mouth daily as needed. 06/28/17  Yes [provider]  ALPRAZolam Duanne Moron) 1 MG tablet Take 1 mg by mouth 3 (three) times daily as needed.  Patient not taking: Reported on 04/25/2020    [provider]  cholecalciferol (VITAMIN D) 1000 units tablet Take 2,000 Units by mouth daily. Patient not taking: No sig reported    [provider]   diclofenac sodium (VOLTAREN) 1 % GEL Apply 4 g topically every 8 (eight) hours as needed. Patient not taking: Reported on 04/25/2020 12/06/16 08/20/18  Milinda Pointer, MD  dicyclomine (BENTYL) 10 MG capsule Take 10 mg by mouth 2 (two) times daily. Patient not taking: No sig reported 07/23/18   [provider]  fluticasone (FLONASE) 50 MCG/ACT nasal spray Place 1-2 sprays into both nostrils daily as needed for rhinitis.  Patient not taking: No sig reported    [provider]  Folate-B12-Intrinsic Factor (INTRINSI B12-FOLATE) [phone] MCG-MCG-MG TABS Take 1 tablet by mouth daily.  Patient not taking: No sig reported    [provider]  gabapentin (NEURONTIN) 300 MG capsule Take 900 mg by mouth 3 (three) times daily.  Patient not taking: No sig reported    [provider]  insulin regular human CONCENTRATED (HUMULIN R) 500 UNIT/ML injection Inject 100-200 Units into the skin 2 (two) times a day. Inject 200 units in the morning, inject 100 units nightly. Based on glucose readings Patient not taking: No sig reported    [provider]  magnesium oxide (MAG-OX) 400 MG tablet Take 400 mg by mouth daily. Patient not taking: No sig reported    [provider]  pantoprazole (PROTONIX) 40 MG tablet Take 40 mg by mouth 2 (two) times daily before a meal. Patient not taking: No sig reported    [provider]  potassium chloride SA (K-DUR,KLOR-CON) 20 MEQ tablet Take 20 mEq by mouth daily as needed. For high blood pressure Patient not taking: No sig reported    [provider]  promethazine (PHENERGAN) 12.5 MG tablet Take 2 tablets (25 mg total) by mouth every 6 (six) hours as needed for nausea or vomiting. Patient not taking: No sig reported 09/25/16   Dustin Flock, MD  rifaximin (XIFAXAN) 550 MG TABS tablet Take 550 mg by mouth daily.  Patient not taking: No sig reported    [provider]  spironolactone (ALDACTONE) 100  MG tablet Take 400 mg by mouth daily.  Patient not taking: No sig reported    [provider]  vitamin B-12 (CYANOCOBALAMIN) 1000 MCG tablet Take 2,000 mcg by mouth daily. Patient not taking: No sig reported    [provider]  SUMAtriptan (IMITREX) 50 MG tablet Take 50 mg by mouth every 2 (two) hours as needed. For migraines  03/17/11  [provider]   Scheduled Meds: . sodium chloride   Intravenous Once  . bacitracin   Topical BID  . Chlorhexidine Gluconate Cloth  6 each Topical Daily  . [START ON 05/01/2020] ciprofloxacin  500 mg Oral Daily  . hydrocerin   Topical Daily  . insulin aspart  0-9 Units Subcutaneous TID WC  . lactulose  10 g Oral BID  . lidocaine  1 patch Transdermal Q24H  . liver oil-zinc oxide   Topical Daily  . midodrine  10 mg Oral TID WC  . morphine  45 mg Oral  Q12H  . pantoprazole (PROTONIX) IV  40 mg Intravenous QHS  . patiromer  25.2 g Oral Daily  . rifaximin  200 mg Oral BID  . sodium bicarbonate  650 mg Oral TID  . sodium chloride  2 g Oral BID WC   Continuous Infusions: . cefTRIAXone (ROCEPHIN)  IV 2 g (04/30/20 0035)   PRN Meds:.ALPRAZolam, docusate sodium, ipratropium-albuterol, morphine injection, ondansetron, oxyCODONE  Resolved Hospital Problem list   Hypokalemia Hepatic Encephalopathy  ASSESSMENT & PLAN   Syncope and collapse Hypotension due suspected hypovolemia in the setting of malnutrition Hypoalbuminemia -Continuous cardiac monitoring -Maintain MAP >60 given Cirrhosis -Will d/c Midodrine and observe response -Echocardiogram 04/25/20 with LVEF 54-00%, grade I Diastolic dysfunction  Suspected Spontaneous Bacterial Peritonitis PMHx: 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 Patient received empiric ABX in ED: flagyl, cefepime, vancomycin. PleurX catheter placed 03/19/20. Sister reports RN came and drained some fluid earlier on  04/24/20. -Continue empiric Ceftriaxone (plan for 7 day course) -S/p Diagnostic Paracentesis 4/23: Analysis of Peritoneal fluid not consistent with SBP (Total Nucleated Cells 179, Neutrophil count 44, albumin <1.0, glucose 157, LD 20, total protein <3.0, amylase 9) -Monitor fever curve -Trend WBC's -Follow cultures as above -To complete 7 day course of Rocephin today 04/30/20 -Plan to start prophylactic dosing of Ciprofloxacin tomorrow 05/01/20 -Can drain fluid every 12 hours as needed 2 liters at a time for discomfort  # Hepatic Encephalopathy>>resolved Chronic pain syndrome followed by outpatient hospice PMHx: PTSD, depression and anxiety - Lactulose titrated to 3-4BM/day, - Rifaximin 566m PO bid - supportive care - avoid sedating medications as tolerated - Emphasize sleep/wake cycle - Pain management - palliative care following, appreciate input  Acute Kidney Injury secondary to suspected hypovolemia in the setting of malnutrition>> Did improve with albumin suggesting pre renal etiology.  Hyperkalemia- Stable Baseline Cr: 0.6, Cr on admission: 1.89.  Has increased to current creatinine which is 1.65 Acute on Chronic Hyponatremia- 127 Patient's baseline sodium over the last 2 years is 130. Suspect this is hypovolemic hyponatremia in the setting of poor PO intake. Patient is slightly altered on admission and received 2 L NS bolus in ED. -Monitor I&O's / urinary output -Follow BMP -Ensure adequate renal perfusion -Avoid nephrotoxic agents as able -Replace electrolytes as indicated -Continue Veltassa -Continue Salt and Bicarb tablets  T2DM -CBG's -SSI -Follow ICU Hypo/Hyperglycemia protocol  COPD without evidence of acute exacerbation PMHx: OSA, asthma Not on home O2 - bronchodilators PRN - supplemental O2 PRN to maintain SpO2 > 90% -CXR and ABG as needed  Pancytopenia-Noted. Doubt the validity of this lab draw as all cell lines have dropped -Monitor for S/Sx of  bleeding -Trend CBC -SCD's for VTE Prophylaxis  (holding SQ Heparin) -Transfuse for Hgb <7    Pt's is currently very adamant on going home today.  Her medical condition continues to slowly improve everyday, however she is currently not medically optimized for discharge (remains on Midodrine, Hyponatremia on salt and bicarb tablets, Hyperkalemia on veltassa, AKI). Discussed with ICU attending Dr. ALanney Ginswho is in agreement that she is not medically optimized for discharge.  Pt understands that medically she is NOT CLEARED for discharge, and in deciding to go [place]  Risks of leaving AMA were explained including but not limited too: decompensation requiring rehospitalization or even Death.  She is alert and oriented x4 and is competent to make her medical decisions.  She understands  and verbalizes the risks of leaving AMA, and wishes to proceed to leave AMA.      Best practice (right click and "Reselect all SmartList Selections" daily)  Diet:  Oral Pain/Anxiety/Delirium protocol (if indicated): Yes VAP protocol (if indicated): Not indicated DVT prophylaxis: Subcutaneous Heparin held because platelet drop GI prophylaxis: PPI Glucose control:  SSI Yes Central venous access:  N/A Arterial line:  N/A Foley:  N/A Mobility:  bed rest  PT consulted: N/A Last date of multidisciplinary goals of care discussion [N/A] Code Status:  DNR Disposition: Med-Surg  Critical care time: 40 minutes     Darel Hong, AGACNP-BC [address] [facility] Prefer epic messenger for cross cover needs If after hours, please call E-link

## 2020-04-30 NOTE — Clinical Social Work Note (Signed)
CSW acknowledges consult for transition home with hospice. Per chart, patient is already an active home hospice patient with [facility]. Will follow for discharge needs.  Dayton Scrape, [place]

## 2020-04-30 NOTE — Progress Notes (Signed)
Patient and family wants to go home today with home hospice, Per [facility] /pccm agreement, PCCM will take care of the order, [facility]m made aware and will take over care.

## 2020-05-01 LAB — CBC WITH DIFFERENTIAL/PLATELET
Abs Immature Granulocytes: 0.01 10*3/uL (ref 0.00–0.07)
Basophils Absolute: 0 10*3/uL (ref 0.0–0.1)
Basophils Relative: 0 %
Eosinophils Absolute: 0.1 10*3/uL (ref 0.0–0.5)
Eosinophils Relative: 2 %
HCT: 27.7 % — ABNORMAL LOW (ref 36.0–46.0)
Hemoglobin: 8.6 g/dL — ABNORMAL LOW (ref 12.0–15.0)
Immature Granulocytes: 0 %
Lymphocytes Relative: 8 %
Lymphs Abs: 0.2 10*3/uL — ABNORMAL LOW (ref 0.7–4.0)
MCH: 27.4 pg (ref 26.0–34.0)
MCHC: 31 g/dL (ref 30.0–36.0)
MCV: 88.2 fL (ref 80.0–100.0)
Monocytes Absolute: 0.3 10*3/uL (ref 0.1–1.0)
Monocytes Relative: 14 %
Neutro Abs: 1.9 10*3/uL (ref 1.7–7.7)
Neutrophils Relative %: 76 %
Platelets: 49 10*3/uL — ABNORMAL LOW (ref 150–400)
RBC: 3.14 MIL/uL — ABNORMAL LOW (ref 3.87–5.11)
RDW: 19.9 % — ABNORMAL HIGH (ref 11.5–15.5)
WBC: 2.5 10*3/uL — ABNORMAL LOW (ref 4.0–10.5)
nRBC: 0 % (ref 0.0–0.2)

## 2020-05-01 LAB — BASIC METABOLIC PANEL
Anion gap: 6 (ref 5–15)
BUN: 27 mg/dL — ABNORMAL HIGH (ref 6–20)
CO2: 22 mmol/L (ref 22–32)
Calcium: 8 mg/dL — ABNORMAL LOW (ref 8.9–10.3)
Chloride: 101 mmol/L (ref 98–111)
Creatinine, Ser: 1.15 mg/dL — ABNORMAL HIGH (ref 0.44–1.00)
GFR, Estimated: 56 mL/min — ABNORMAL LOW (ref >60)
Glucose, Bld: 108 mg/dL — ABNORMAL HIGH (ref 70–99)
Potassium: 4.8 mmol/L (ref 3.5–5.1)
Sodium: 129 mmol/L — ABNORMAL LOW (ref 135–145)

## 2020-05-01 LAB — MAGNESIUM: Magnesium: 1.9 mg/dL (ref 1.7–2.4)

## 2020-05-01 LAB — GLUCOSE, CAPILLARY
Glucose-Capillary: 100 mg/dL — ABNORMAL HIGH (ref 70–99)
Glucose-Capillary: 101 mg/dL — ABNORMAL HIGH (ref 70–99)
Glucose-Capillary: 105 mg/dL — ABNORMAL HIGH (ref 70–99)

## 2020-05-01 LAB — PHOSPHORUS: Phosphorus: 3 mg/dL (ref 2.5–4.6)

## 2020-05-01 MED ORDER — MIDODRINE HCL 10 MG PO TABS: 10.0000 mg | ORAL_TABLET | Freq: Three times a day (TID) | ORAL | 0 refills | Status: AC

## 2020-05-01 MED ORDER — MIDODRINE HCL 10 MG PO TABS: 10.0000 mg | ORAL_TABLET | Freq: Three times a day (TID) | ORAL | 0 refills | Status: DC

## 2020-05-01 NOTE — Progress Notes (Signed)
[name] to be D/C'd Home with hospice per MD order.  Discussed prescriptions and follow up appointments with the patient and daughter. Prescriptions given to patient, medication list explained in detail. Pt verbalized understanding.  Allergies as of 05/01/2020      Reactions   Aspirin    unknown   Nsaids Other (See Comments)   Liver problems   Tape Swelling   Tylenol [acetaminophen]    Not specified   Vicodin [hydrocodone-acetaminophen] Itching, Rash      Medication List    STOP taking these medications   potassium chloride SA 20 MEQ tablet Commonly known as: KLOR-CON   spironolactone 100 MG tablet Commonly known as: ALDACTONE     TAKE these medications   albuterol 108 (90 Base) MCG/ACT inhaler Commonly known as: VENTOLIN HFA Inhale 2 puffs into the lungs every 6 (six) hours as needed for wheezing or shortness of breath.   albuterol (2.5 MG/3ML) 0.083% nebulizer solution Commonly known as: PROVENTIL Take 2.5 mg by nebulization every 4 (four) hours as needed.   ALPRAZolam 1 MG tablet Commonly known as: XANAX Take 1 mg by mouth 3 (three) times daily as needed.   ALPRAZolam 0.5 MG tablet Commonly known as: XANAX Take 0.5 mg by mouth every 4 (four) hours as needed.   cholecalciferol 1000 units tablet Commonly known as: VITAMIN D Take 2,000 Units by mouth daily.   citalopram 10 MG tablet Commonly known as: CELEXA Take 10 mg by mouth daily.   diclofenac sodium 1 % Gel Commonly known as: VOLTAREN Apply 4 g topically every 8 (eight) hours as needed.   dicyclomine 10 MG capsule Commonly known as: BENTYL Take 10 mg by mouth 2 (two) times daily.   diphenhydrAMINE 25 MG tablet Commonly known as: BENADRYL Take 25 mg by mouth every 6 (six) hours as needed.   fluticasone 50 MCG/ACT nasal spray Commonly known as: FLONASE Place 1-2 sprays into both nostrils daily as needed for rhinitis.   gabapentin 300 MG capsule Commonly known as: NEURONTIN Take 900 mg by mouth  3 (three) times daily.   insulin regular human CONCENTRATED 500 UNIT/ML injection Commonly known as: HUMULIN R Inject 100-200 Units into the skin 2 (two) times a day. Inject 200 units in the morning, inject 100 units nightly. Based on glucose readings   Intrinsi B12-Folate 800-500-20 MCG-MCG-MG Tabs Take 1 tablet by mouth daily.   lactulose 10 GM/15ML solution Commonly known as: CHRONULAC Take 30 mLs (20 g total) by mouth 3 (three) times daily.   magnesium oxide 400 MG tablet Commonly known as: MAG-OX Take 400 mg by mouth daily.   midodrine 10 MG tablet Commonly known as: PROAMATINE Take 1 tablet (10 mg total) by mouth 3 (three) times daily with meals.   morphine 15 MG 12 hr tablet Commonly known as: MS CONTIN Take 3 tablets by mouth every 12 (twelve) hours.   nystatin powder Commonly known as: MYCOSTATIN/NYSTOP Apply 1 g topically 3 (three) times daily as needed. For irritation   ondansetron 4 MG disintegrating tablet Commonly known as: ZOFRAN-ODT Take 4 mg by mouth every 4 (four) hours as needed for nausea.   Oxycodone HCl 10 MG Tabs Take 10 mg by mouth every 3 (three) hours as needed.   pantoprazole 40 MG tablet Commonly known as: PROTONIX Take 40 mg by mouth 2 (two) times daily before a meal.   promethazine 12.5 MG tablet Commonly known as: PHENERGAN Take 2 tablets (25 mg total) by mouth every 6 (six) hours  as needed for nausea or vomiting.   rifaximin 550 MG Tabs tablet Commonly known as: XIFAXAN Take 550 mg by mouth daily.   rizatriptan 5 MG tablet Commonly known as: MAXALT Take 5 mg by mouth daily as needed.   vitamin B-12 1000 MCG tablet Commonly known as: CYANOCOBALAMIN Take 2,000 mcg by mouth daily.            Discharge Care Instructions  (From admission, onward)         Start     Ordered   05/01/20 0000  Change dressing (specify)       Comments: Dressing change: Change abdominal PleurX catheter as needed, and to be changed by Hospice  nurse   05/01/20 1002          Vitals:   05/01/20 0400 05/01/20 0758  BP: (!) 129/43 121/61  Pulse: 60 68  Resp: 16 20  Temp: 98 F (36.7 C) 97.9 F (36.6 C)  SpO2:  100%    Skin clean, dry and intact without evidence of skin break down, no evidence of skin tears noted. IV catheter discontinued intact. Site without signs and symptoms of complications. Dressing and pressure applied. Pt denies pain at this time. No complaints noted.  An After Visit Summary was printed and given to the patient. Patient escorted via [place], and D/C home via private auto.  Rolley Sims

## 2020-05-01 NOTE — Progress Notes (Addendum)
Patient walking on unit with visitor. Patient informed she needed to remain on the unit while walking. Patient stated "I am being discharged today, they said I could go home." Patient informed we were awaiting discharge order and it was reiterated that she should remain on the unit. Security notified.   0948- Patient returned to her room at this time. Chair alarm placed by NA.

## 2020-05-01 NOTE — Discharge Summary (Signed)
Physician Discharge Summary  Patient ID: Lori Mckenzie MRN: [id number] DOB/AGE: [birth year] 55 y.o.  Admit date: 04/24/2020 Discharge date: 05/01/2020  Brief Pt Description/Synopsis: 55 year old female with extensive history who presented with syncope and collapse, found to be hypotensive (hypovolemic +/- septic shock) with suspected SBP, along with Acute Kidney Injury, Hyperkalemia, acute on chronic Hyponatremia, and Hepatic Encephalopathy.  Significant Hospital Events: Including procedures, antibiotic start and stop dates in addition to other pertinent events    04/24/20: Admit to ICU on vasopressor 4/23: GI consulted 4/25: Remains on Levophed (6 mcg), lowering MAP goal to 60 due to cirrhosis 4/26: Levophed weaned off overnight with MAP gaol of 60 did get 1 liter of Fluid removed overnight 4/28: Transferred to [place] unit yesterday, wanting to be discharged although not medically optimized at this time, pt plans to leave [facility] 4/29: Discharge home with hospice   Cultures:  4/22: SARS-CoV-2 PCR>> negative 4/22: Influenza PCR>> negative 4/22: Blood culture x2>>No growth   Antimicrobials:  Ceftriaxone 4/22>>4/28    Discharge Diagnoses:  Syncope Hypotension Hypoalbuminemia Suspected Spontaneous Bacterial Peritonitis Hepatic Encephalopathy Acute Kidney Injury Hyperkalemia Acute on Chronic Hyponatremia COPD without acute exacerbation Diabetes Mellitus Type II Pancytopnenia                                                                     DISCHARGE PLAN BY DIAGNOSIS    Syncope and collapse>>resolved Hypotension, due suspected hypovolemic shock in the setting of malnutrition +/- Septic shock>>resolved Hypoalbuminemia -Continue with Midodrine 10 mg TID w/ meals -Echocardiogram 04/25/20 with LVEF 41-66%, grade I Diastolic dysfunction  Suspected Spontaneous Bacterial Peritonitis 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 Patient received empiric ABX in ED: flagyl,cefepime, vancomycin. PleurX catheter placed 03/19/20. Sister reports RN came and drained some fluid earlier on 04/24/20. -S/p Diagnostic Paracentesis 4/23: Analysis of Peritoneal fluid not consistent with SBP (Total Nucleated Cells 179, Neutrophil count 44, albumin <1.0, glucose 157, LD 20, total protein <3.0, amylase 9) -Cultures of Peritoneal fluid never obtained -Monitor fever curve -Completed 7 days course of Ceftriaxone -Can drain fluid/ascites every 12 hours as needed 2 liters at a time for discomfort  # Hepatic Encephalopathy>>resolved Chronic pain syndrome followed by outpatient hospice PMHx: PTSD, depression and anxiety - Continue home Lactulose titrated to 3-4BM/day, - Rifaximin 577m PO bid - supportive care - Pain management - Hospice following at home  Acute Kidney Injury secondary tosuspected hypovolemiain the setting ofmalnutrition>> Did improve with albumin suggesting pre renal etiology.  Hyperkalemia-resolved Baseline Cr:0.6, Cr on admission:1.89.  Has increased to current creatinine which is 1.65 Acute on Chronic Hyponatremia Patient's baseline sodium over the last 2 years is 130. Suspect this is hypovolemic hyponatremia in the setting of poor PO intake. Patient is slightly altered on admission and received 2 L NS bolus in ED. -Hold home Spironolactone and Potassium supplements for now until follow up with PCP -Received [place], salt and Bicarb while in hospital>>not continuing at discharge  T2DM -CBG's -Continue home Humulin R  COPD without evidence of acute exacerbation PMHx: OSA, asthma Not on home O2 - bronchodilators (Albuterol) as needed for shortness of breath/wheezing    Patient and family aware of no meaningful recovery and patient is most likely  as stable as she will get given her co-morbidities  Plan is for continued hospice support at home. Pt is expressing she is  not interested in further aggressive work-up but to focus on her comfort and what time she has left.   Family has been updated and goals remain clear for patient to discharge home with sister once appropriate. Family understands patient's current illness and co-morbidities and once home the goal is to focus on her comfort allowing patient to spend what time she has left with family.     PHYSICAL EXAMINATION: GENERAL: Chronically ill appearing 55 year-old patient, sitting in the bed, on room air, with no acute distress.  EYES: Pupils equal, round, reactive to light and accommodation. No scleral icterus. Extraocular muscles intact.  HEENT: Head atraumatic, normocephalic. Oropharynx and nasopharynx clear.  NECK: Supple, no jugular venous distention. No thyroid enlargement, no tenderness.  LUNGS: Fine crackles to bases bilaterally otherwise clear, no wheezing, even, nonlabored, normal effort CARDIOVASCULAR: regular rate and rhythm, s1s2, no M/R/G, 2+ distal pulses ABDOMEN: Soft, nontender, slightly distended. Bowel sounds present. No organomegaly or mass. Pleurx catheter  in place EXTREMITIES: Normal bulk and tone, no deformities, 1+ pedal edema bilaterally NEUROLOGIC: Cranial nerves II through XII are intact.Muscle strength 5/5 in all extremities. Sensation intact. Gait not checked.  PSYCHIATRIC: Awake, alert and oriented x 4 SKIN:Open abrasion on right cheek with bruising,scattered ecchymosis bilateral upper arms                        DISCHARGE SUMMARY   55 year old female with extensive past medical history including severe cirrhosis, on home hospice arriving to the [facility] ED after a fall while out shopping at [place] with her sister on 04/24/20. ED course: Per ED documentation and chart review the patient was just released from a nursing home after a recent hospital admission.  She is recently moved to [facility] to live with her sister, [name].  And was also recently transferred  to local home hospice services.  Patient was very disoriented upon arrival, intermittently lethargic and very weak.  She complained of right facial pain, right-sided chest pain and diffuse abdominal pain.  Per ED documentation she was walking around shopping when she was noted to seemingly leaned forward falling. Initial vital signs: Per Dr. Ellender Hose the patient was profoundly hypotensive upon arrival with an SBP in the 60s and was started on IV fluids and peripheral Levophed.  For set of documented vitals: Temp 98.2, RR 16, HR 88, BP 81/28 (52) & 100% SPO2 on room air. Significant labs: Hyponatremic at 122, hyperkalemic at 6.1, hypochloremia at 97, AKI with BUN/Cr-46/1.89, serum CO2 20, albumin 2.5, hemoglobin 9.3, troponin negative at 8, BNP negative at 9.2 & lactic unimpressive at 1.4 > 1.8.  CT head/abdomen/pelvis/maxillary & cervical spine all negative for acute fractures/abnormalities, with the exception of known NASH & soft tissue swelling on right premolar area.  PCCM was asked to admit the pt for further workup and treatment of Hypotension (hypovolemic +/- septic shock) requiring vasopressors, with suspected SBP, along with Acute Kidney Injury, Hyperkalemia, acute on chronic Hyponatremia, and Hepatic Encephalopathy.  Hospital Course: Pt's shock has resolved, and she received a 7 day course of Ceftriaxone.  Her AKI, Hyperkalemia, Hyponatremia, and Hepatic Encephalopathy have improved.  She has been medically optimized considering all her co-morbidities. She and family are aware of no meaningful recovery and patient is most likely as stable as she will get given her co-morbidities  She  is being discharged home with Hospice via [facility], with the goal is to focus on her comfort allowing patient to spend what time she has left with family.               SIGNIFICANT DIAGNOSTIC STUDIES 04/24/20 CT maxillofacial wo contrast >>mild right premolar soft tissue swelling,no acute facial bone  fracture 04/24/2020 CT cervical spine without contrast>>straightening of the cervical spine with degenerative changes no acute osseous abnormality 04/24/2020 CT head/abdomen/pelvis without contrast>>no acute intracranial abnormality. Prior TIPS shunt placement with stable cirrhotic hepatic morphology with sequela of portal hypertension including stable splenomegaly,mild a sending portal colopathy and decreased moderate volume ascites status post peritoneal catheter placement. No acute trauma noted CXR4/22/2022:No active disease   CONSULTS PCCM GI Palliative Care  TUBES / LINES N/A   Discharge Exam: GENERAL: Chronically ill appearing 55 year-old patient, sitting in the bed, on room air, with no acute distress.  EYES: Pupils equal, round, reactive to light and accommodation. No scleral icterus. Extraocular muscles intact.  HEENT: Head atraumatic, normocephalic. Oropharynx and nasopharynx clear.  NECK: Supple, no jugular venous distention. No thyroid enlargement, no tenderness.  LUNGS: Clear to auscultation throughout, no wheezing or rales noted, even, nonlabored, normal effort CARDIOVASCULAR: regular rate and rhythm, s1s2, no M/R/G, 2+ distal pulses ABDOMEN: Soft, nontender, slightly distended. Bowel sounds present. No organomegaly or mass. Pleurx catheter in place clean, dry, and intact. EXTREMITIES: Normal bulk and tone, no deformities, 1+ pedal edema bilaterally NEUROLOGIC: Cranial nerves II through XII are intact.Muscle strength 5/5 in all extremities. Sensation intact. Gait not checked.  PSYCHIATRIC: Awake, alert and oriented x 3 SKIN:Open abrasion on right cheek with bruising,scattered ecchymosis bilateral upper arms    Vitals:   04/30/20 1938 04/30/20 2325 05/01/20 0400 05/01/20 0758  BP: 110/65 (!) 110/55 (!) 129/43 121/61  Pulse: 60 (!) 55 60 68  Resp: 16 18 16 20   Temp: 98.2 F (36.8 C) 98.6 F (37 C) 98 F (36.7 C) 97.9 F (36.6 C)  TempSrc:  Oral Oral Oral   SpO2: 90% 100%  100%  Weight:      Height:         Discharge Labs  BMET Recent Labs  Lab 04/27/20 0653 04/27/20 1151 04/28/20 0502 04/28/20 0749 04/28/20 1146 04/29/20 0357 04/29/20 1002 04/30/20 0417 05/01/20 0449  NA 123*   < > 122* 119* 119* 123*  --  127* 129*  K 5.1  --  5.4*  --  5.2* 5.5*   < > 5.2* 4.8  CL 99  --  99  --  96* 95*  --  99 101  CO2 20*  --  18*  --  18* 23  --  23 22  GLUCOSE 144*  --  92  --  122* 114*  --  101* 108*  BUN 22*  --  26*  --  27* 29*  --  28* 27*  CREATININE 1.08*  --  1.65*  --  1.51* 1.38*  --  1.28* 1.15*  CALCIUM 8.0*  --  7.8*  --  7.7* 7.8*  --  8.0* 8.0*  MG 1.9  --  1.8  --   --  1.8  --  2.1 1.9  PHOS 2.8  --  3.5  --   --  2.9  --  3.1 3.0   < > = values in this interval not displayed.    CBC Recent Labs  Lab 04/29/20 1302 04/29/20 2309 04/30/20 0417 05/01/20 0449  HGB  6.8* 8.8* 7.9* 8.6*  HCT 21.5* 27.6* 24.7* 27.7*  WBC 1.9*  --  2.0* 2.5*  PLT 46*  --  49* 49*    Anti-Coagulation Recent Labs  Lab 04/24/20 1809  INR 1.3*        Follow-up Information    Pcp, No .                Allergies as of 05/01/2020      Reactions   Aspirin    unknown   Nsaids Other (See Comments)   Liver problems   Tape Swelling   Tylenol [acetaminophen]    Not specified   Vicodin [hydrocodone-acetaminophen] Itching, Rash      Medication List    STOP taking these medications   potassium chloride SA 20 MEQ tablet Commonly known as: KLOR-CON   spironolactone 100 MG tablet Commonly known as: ALDACTONE     TAKE these medications   albuterol 108 (90 Base) MCG/ACT inhaler Commonly known as: VENTOLIN HFA Inhale 2 puffs into the lungs every 6 (six) hours as needed for wheezing or shortness of breath.   albuterol (2.5 MG/3ML) 0.083% nebulizer solution Commonly known as: PROVENTIL Take 2.5 mg by nebulization every 4 (four) hours as needed.   ALPRAZolam 1 MG tablet Commonly known as: XANAX Take 1 mg by mouth  3 (three) times daily as needed.   ALPRAZolam 0.5 MG tablet Commonly known as: XANAX Take 0.5 mg by mouth every 4 (four) hours as needed.   cholecalciferol 1000 units tablet Commonly known as: VITAMIN D Take 2,000 Units by mouth daily.   citalopram 10 MG tablet Commonly known as: CELEXA Take 10 mg by mouth daily.   diclofenac sodium 1 % Gel Commonly known as: VOLTAREN Apply 4 g topically every 8 (eight) hours as needed.   dicyclomine 10 MG capsule Commonly known as: BENTYL Take 10 mg by mouth 2 (two) times daily.   diphenhydrAMINE 25 MG tablet Commonly known as: BENADRYL Take 25 mg by mouth every 6 (six) hours as needed.   fluticasone 50 MCG/ACT nasal spray Commonly known as: FLONASE Place 1-2 sprays into both nostrils daily as needed for rhinitis.   gabapentin 300 MG capsule Commonly known as: NEURONTIN Take 900 mg by mouth 3 (three) times daily.   insulin regular human CONCENTRATED 500 UNIT/ML injection Commonly known as: HUMULIN R Inject 100-200 Units into the skin 2 (two) times a day. Inject 200 units in the morning, inject 100 units nightly. Based on glucose readings   Intrinsi B12-Folate 800-500-20 MCG-MCG-MG Tabs Take 1 tablet by mouth daily.   lactulose 10 GM/15ML solution Commonly known as: CHRONULAC Take 30 mLs (20 g total) by mouth 3 (three) times daily.   magnesium oxide 400 MG tablet Commonly known as: MAG-OX Take 400 mg by mouth daily.   midodrine 10 MG tablet Commonly known as: PROAMATINE Take 1 tablet (10 mg total) by mouth 3 (three) times daily with meals.   morphine 15 MG 12 hr tablet Commonly known as: MS CONTIN Take 3 tablets by mouth every 12 (twelve) hours.   nystatin powder Commonly known as: MYCOSTATIN/NYSTOP Apply 1 g topically 3 (three) times daily as needed. For irritation   ondansetron 4 MG disintegrating tablet Commonly known as: ZOFRAN-ODT Take 4 mg by mouth every 4 (four) hours as needed for nausea.   Oxycodone HCl 10 MG  Tabs Take 10 mg by mouth every 3 (three) hours as needed.   pantoprazole 40 MG tablet Commonly known as: PROTONIX  Take 40 mg by mouth 2 (two) times daily before a meal.   promethazine 12.5 MG tablet Commonly known as: PHENERGAN Take 2 tablets (25 mg total) by mouth every 6 (six) hours as needed for nausea or vomiting.   rifaximin 550 MG Tabs tablet Commonly known as: XIFAXAN Take 550 mg by mouth daily.   rizatriptan 5 MG tablet Commonly known as: MAXALT Take 5 mg by mouth daily as needed.   vitamin B-12 1000 MCG tablet Commonly known as: CYANOCOBALAMIN Take 2,000 mcg by mouth daily.         Disposition: Home with Hospice ([facility])  Discharged Condition: Jazzmen Restivo has met maximum benefit of inpatient care and is medically stable and cleared for discharge.  Patient is pending follow up as above.      Time spent on disposition:  50 Minutes.   Signed: Darel Hong, AGACNP-BC [facility] Prefer epic messenger for cross cover needs If after hours, please call E-link

## 2020-05-01 NOTE — Clinical Social Work Note (Signed)
Patient has orders to discharge home with hospice today. Lori Mckenzie and Lori Mckenzie with [facility] are aware. No further concerns.  CSW signing off.  Dayton Scrape, [place]

## 2021-09-07 IMAGING — DX DG CHEST 1V PORT
1 series · 1 of 1 positions shown · non-contrast
Comparison: February 14, 2020

CLINICAL DATA: Fall

EXAM:
PORTABLE CHEST 1 VIEW

[chest ap]
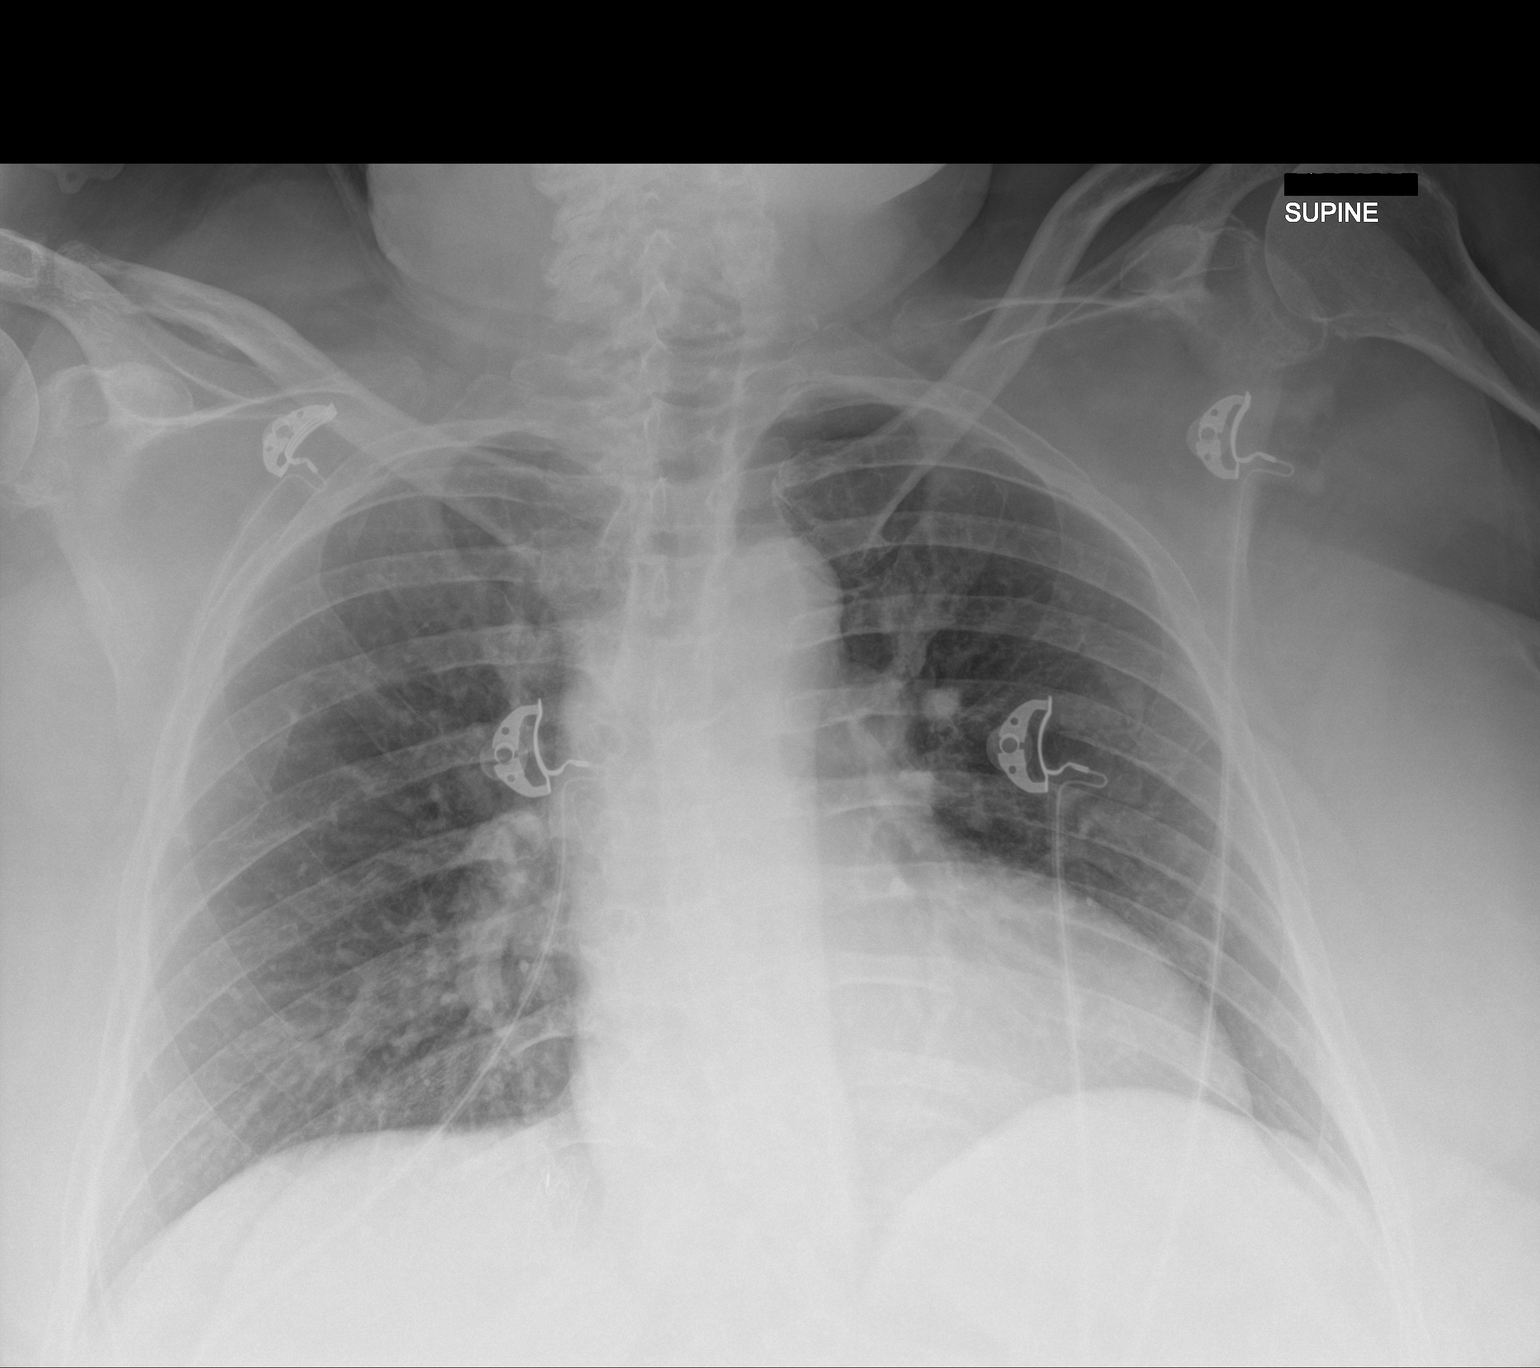

[1 of 1 positions shown; findings below may reference images not displayed]

FINDINGS: The heart size and mediastinal contours are within normal limits.
Both lungs are clear. The visualized skeletal structures are
unremarkable.
IMPRESSION: No active disease.

## 2021-09-07 IMAGING — CT CT HEAD W/O CM
4 series · 15 of 47 positions shown, 17 images · non-contrast
Comparison: CT 07/24/2018

CLINICAL DATA: Bruising to right cheek from fall at [place]

EXAM:
CT HEAD WITHOUT CONTRAST
CT MAXILLOFACIAL WITHOUT CONTRAST
CT CERVICAL SPINE WITHOUT CONTRAST
TECHNIQUE: Multidetector CT imaging of the head, cervical spine, and
maxillofacial structures were performed using the standard protocol
without intravenous contrast. Multiplanar CT image reconstructions
of the cervical spine and maxillofacial structures were also
generated.

[Series 2: head wo · axial · 0.45mm/px · z∈[+318,+438]mm · 6 of 34 slices shown, 8 images]
[im 5/34  brain]
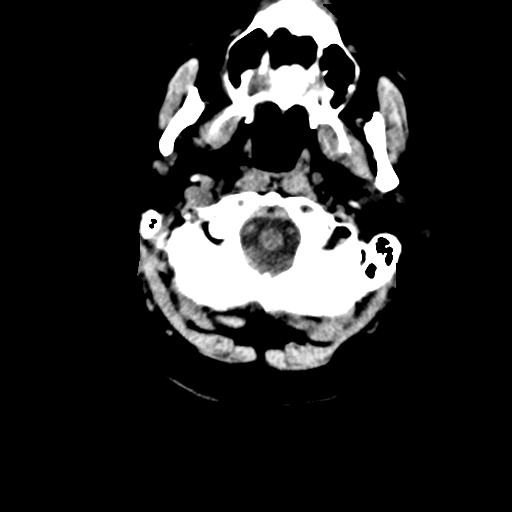
[im 5/34  bone]
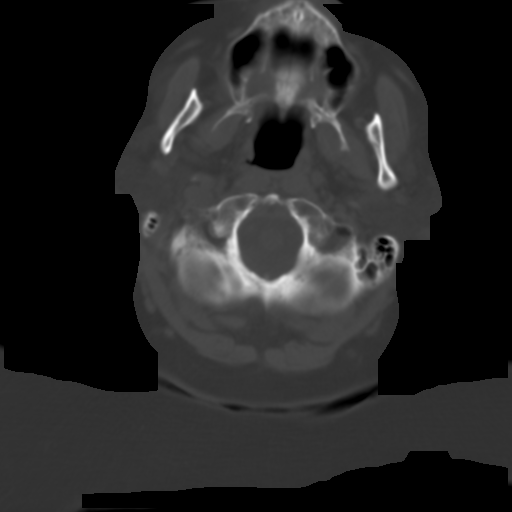
[im 10/34  brain]
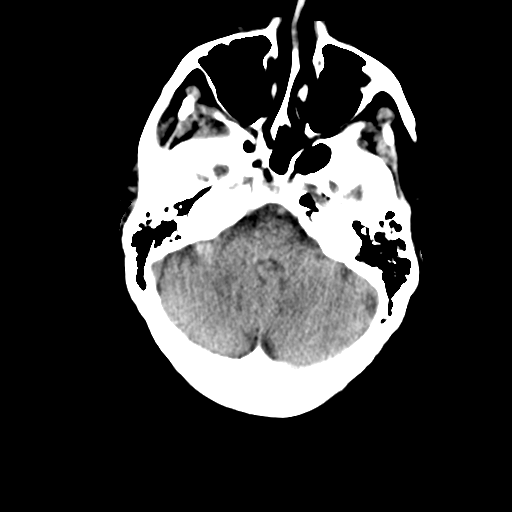
[im 15/34  brain]
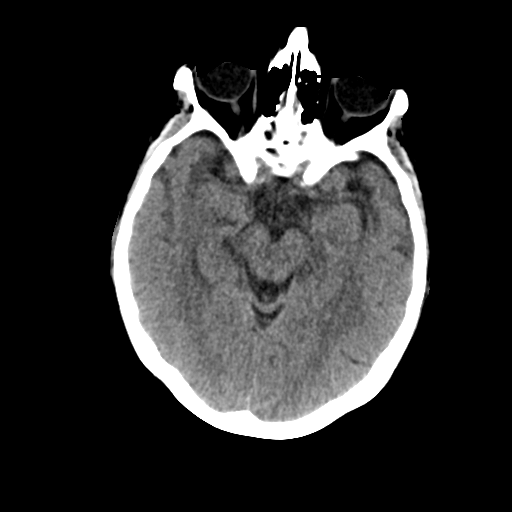
[im 19/34  brain]
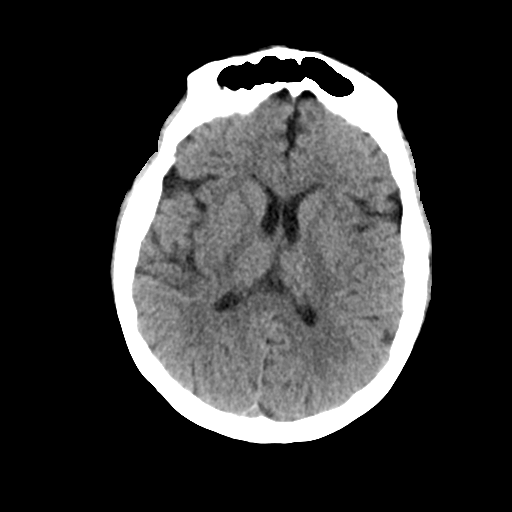
[im 24/34  brain]
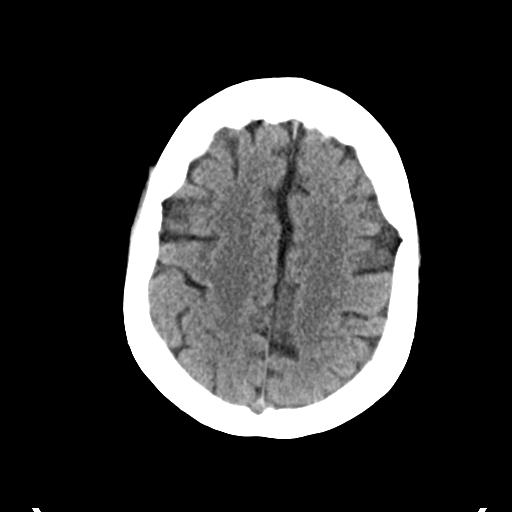
[im 24/34  bone]
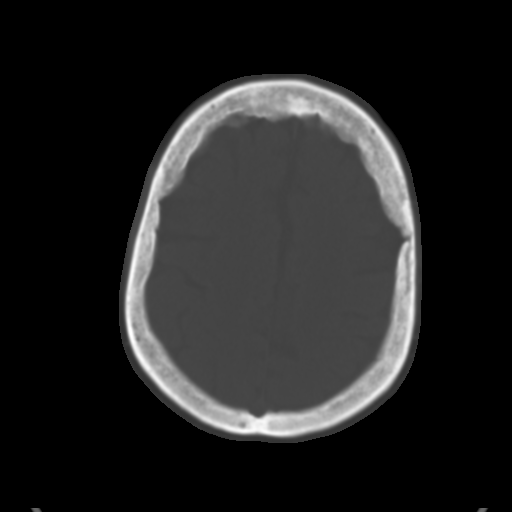
[im 29/34  brain]
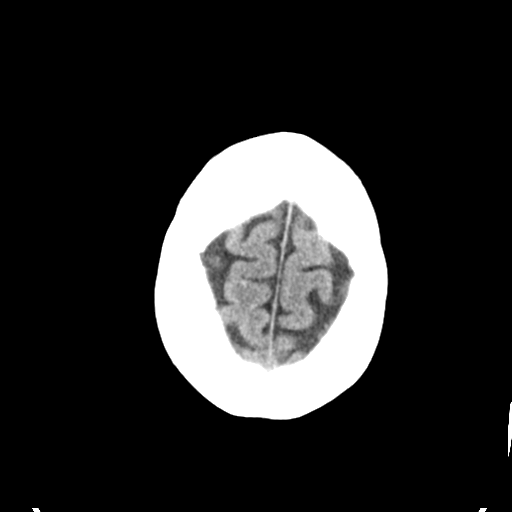

[Series 3: head bone · axial · 0.45mm/px · z∈[+314,+354]mm · 3 of 87 slices shown]
[im 9/87  bone]
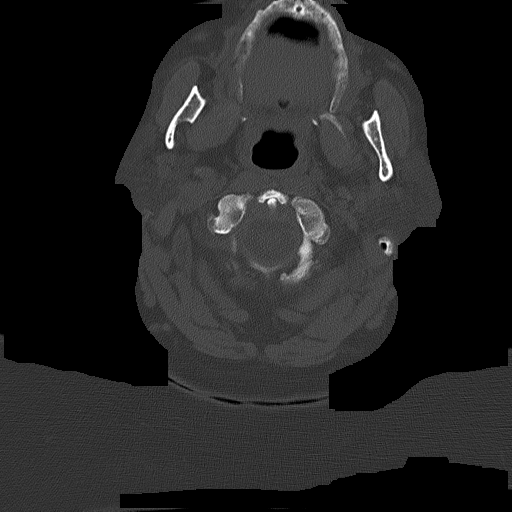
[im 17/87  bone]
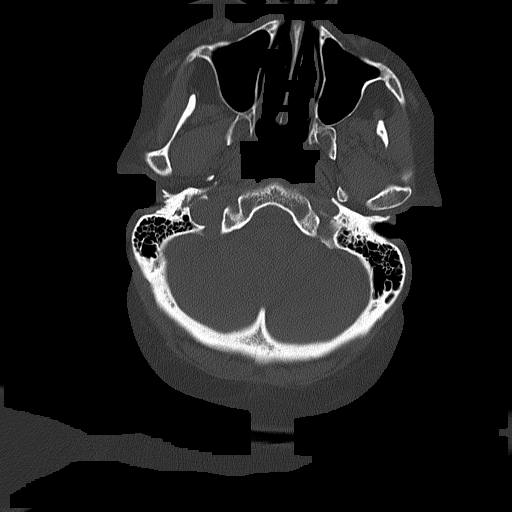
[im 29/87  bone]
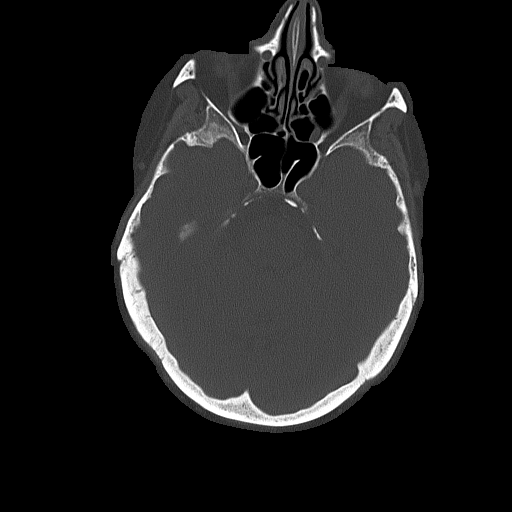

[Series 4: coronal soft tissue · coronal · 0.34mm/px · 3 of 72 slices shown]
[im 26/72  brain]
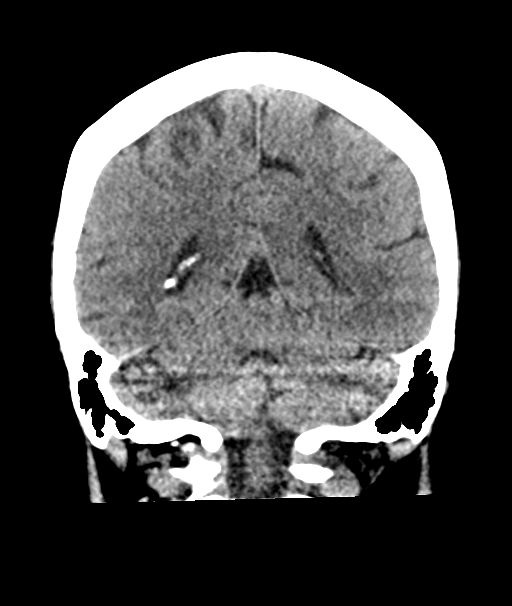
[im 33/72  brain]
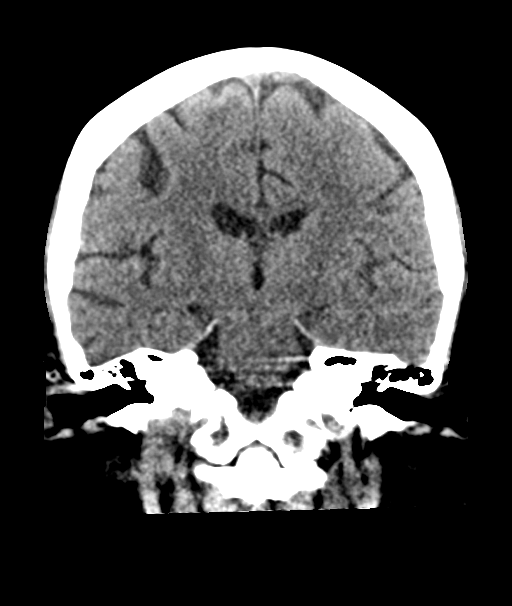
[im 39/72  brain]
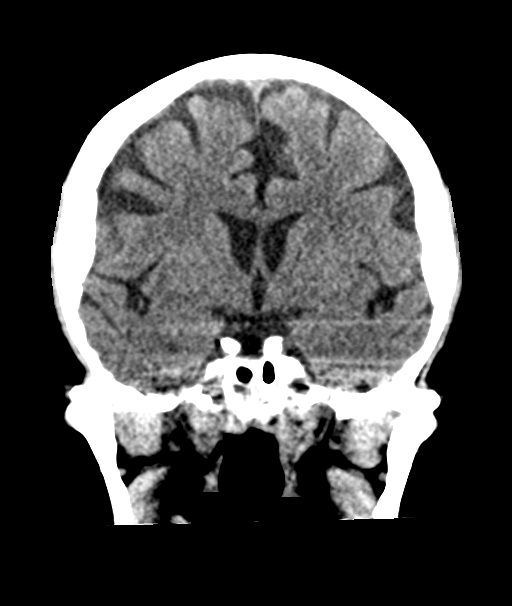

[Series 5: sagittal soft tissue · sagittal · 0.37mm/px · 3 of 52 slices shown]
[im 18/52  brain]
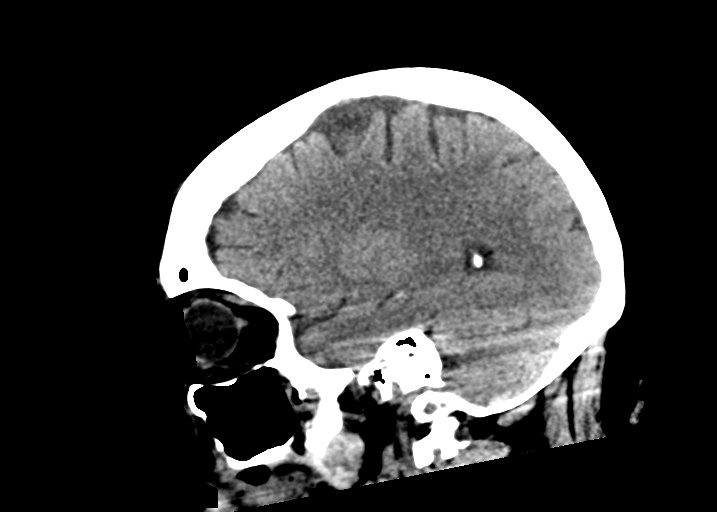
[im 26/52  brain]
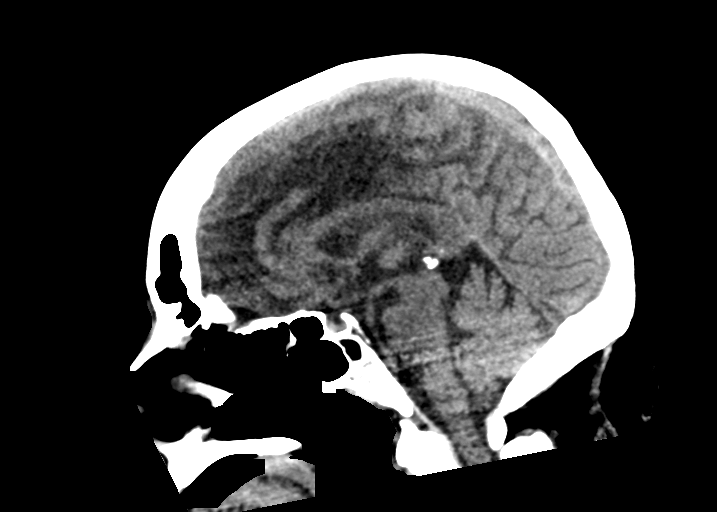
[im 35/52  brain]
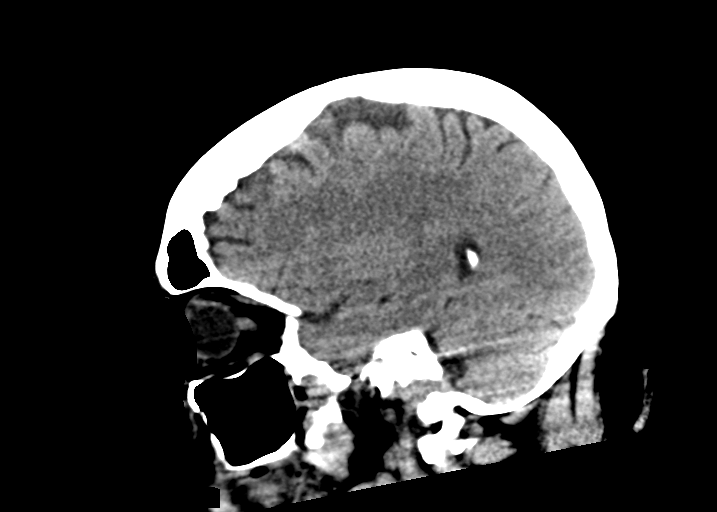

[15 of 47 positions shown; findings below may reference images not displayed]

FINDINGS: CT HEAD FINDINGS

Brain: No evidence of acute infarction, hemorrhage, hydrocephalus,
extra-axial collection or mass lesion/mass effect.

Vascular: No hyperdense vessels.  No unexpected calcification

Skull: Normal. Negative for fracture or focal lesion.

Other: None

CT MAXILLOFACIAL FINDINGS

Osseous: Mandibular heads are normally position. Mastoid air cells
are clear. No mandibular fracture. Pterygoid plates and zygomatic
arches are intact. No acute nasal fracture

Orbits: Negative. No traumatic or inflammatory finding.

Sinuses: Clear.

Soft tissues: Mild right premalar soft tissue swelling

CT CERVICAL SPINE FINDINGS

Alignment: Straightening of the cervical spine. No subluxation.
Facet alignment within normal limits.

Skull base and vertebrae: No acute fracture. No primary bone lesion
or focal pathologic process.

Soft tissues and spinal canal: No prevertebral fluid or swelling. No
visible canal hematoma.

Disc levels: Multiple level degenerative change with moderate disc
space narrowing and degenerative change at C4-C5, C5-C6 and C6-C7.
Posterior disc osteophyte complex at C4-C5 and C6-C7. Facet
degenerative changes at multiple levels. Moderate bilateral
foraminal narrowing C4-C5.

Upper chest: Negative.

Other: None
IMPRESSION: 1. Negative non contrasted CT appearance of the brain.
2. Mild right premalar soft tissue swelling. No acute facial bone
fracture.
3. Straightening of the cervical spine with degenerative changes. No
acute osseous abnormality.

## 2021-09-07 IMAGING — CT CT CHEST-ABD-PELV W/O CM
2 of 4 series · 14 of 36 positions shown, 16 images · non-contrast
Comparison: CT abdomen pelvis February 14, 2020 50 which

CLINICAL DATA: Abdominal/chest trauma, minor

EXAM:
CT CHEST, ABDOMEN AND PELVIS WITHOUT CONTRAST
TECHNIQUE: Multidetector CT imaging of the chest, abdomen and pelvis was
performed following the standard protocol without IV contrast.

[Series 2: cap wo st · axial · 0.83mm/px · z∈[-343,+212]mm · 11 of 135 slices shown, 13 images]
[im 12/135  mediastinal]
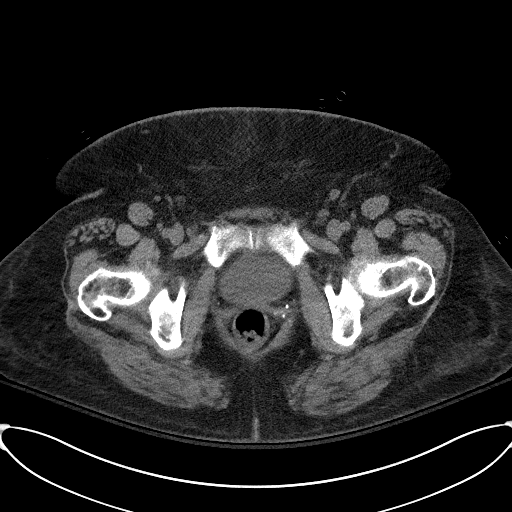
[im 12/135  bone]
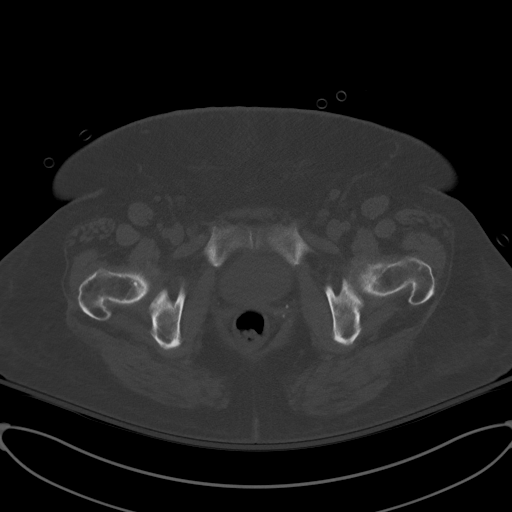
[im 23/135  mediastinal]
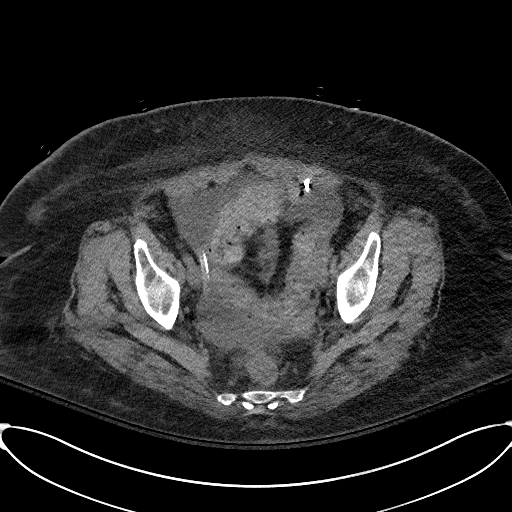
[im 34/135  mediastinal]
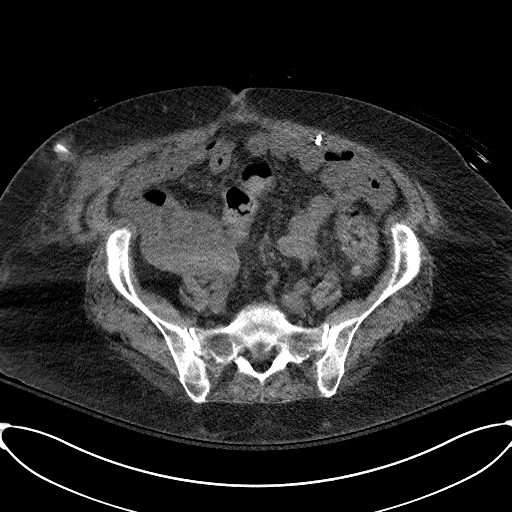
[im 45/135  mediastinal]
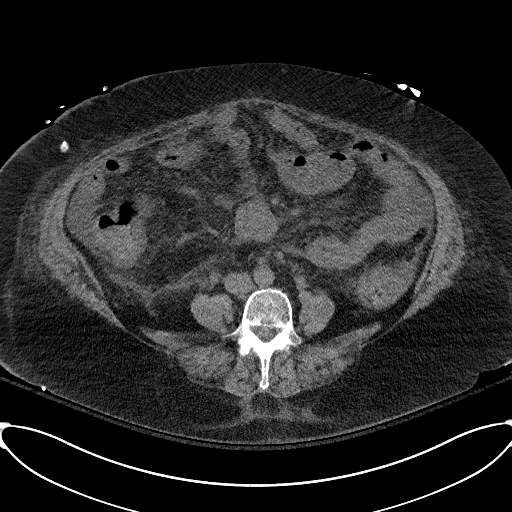
[im 56/135  mediastinal]
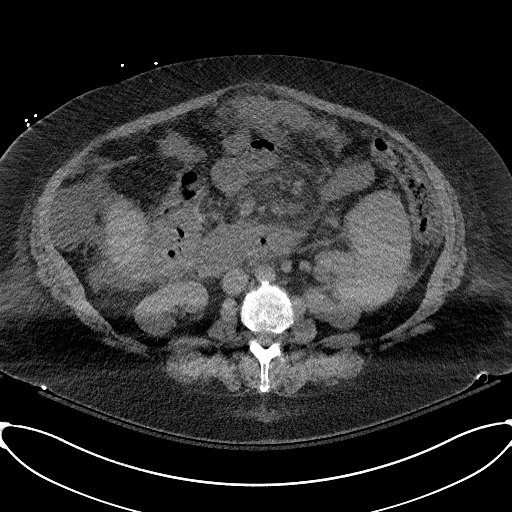
[im 68/135  mediastinal]
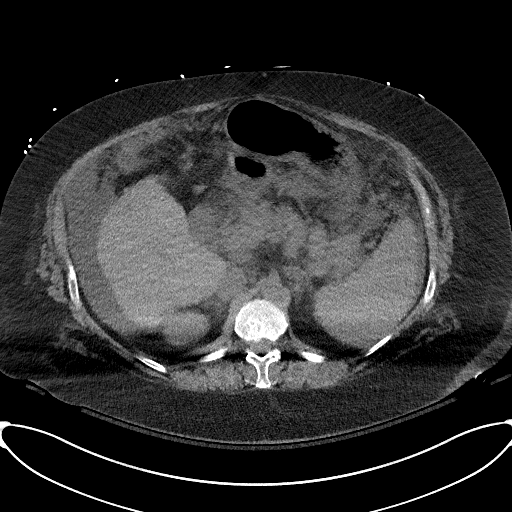
[im 79/135  mediastinal]
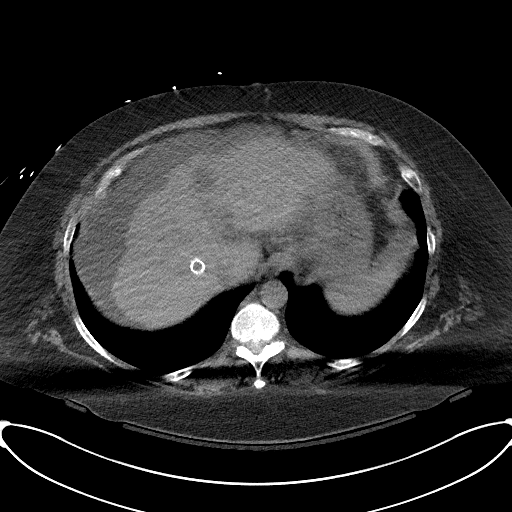
[im 90/135  mediastinal]
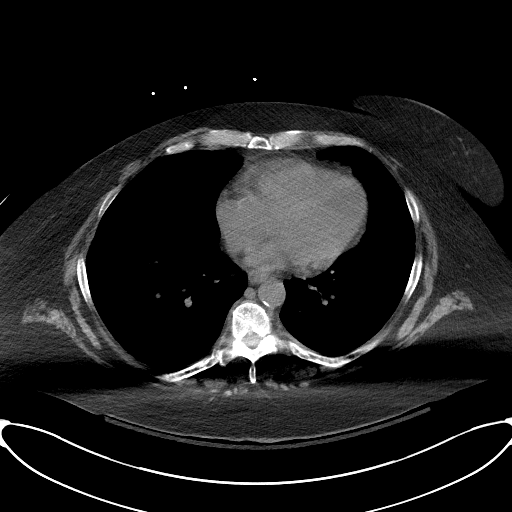
[im 101/135  mediastinal]
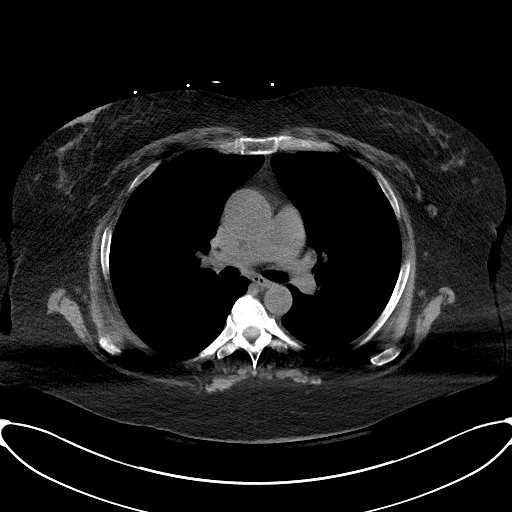
[im 101/135  bone]
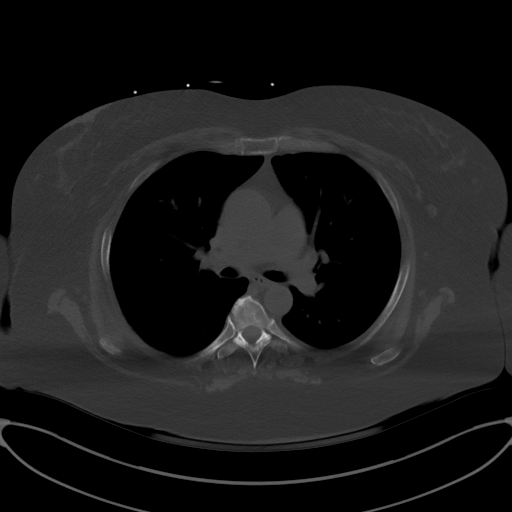
[im 112/135  mediastinal]
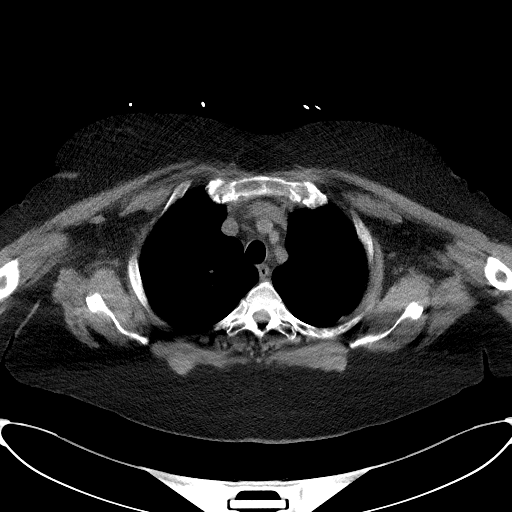
[im 123/135  mediastinal]
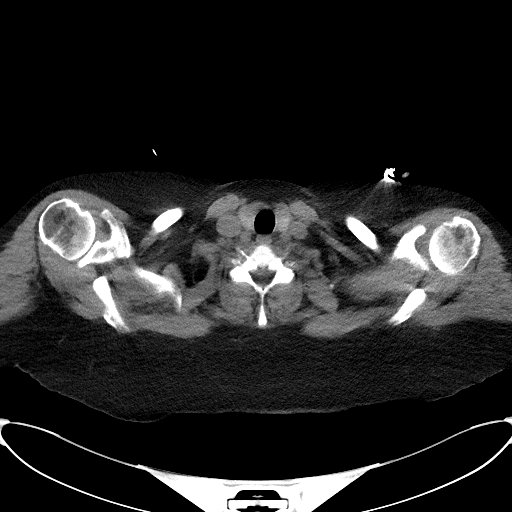

[Series 5: coronal · coronal · 0.98mm/px · 3 of 168 slices shown]
[im 34/168  mediastinal]
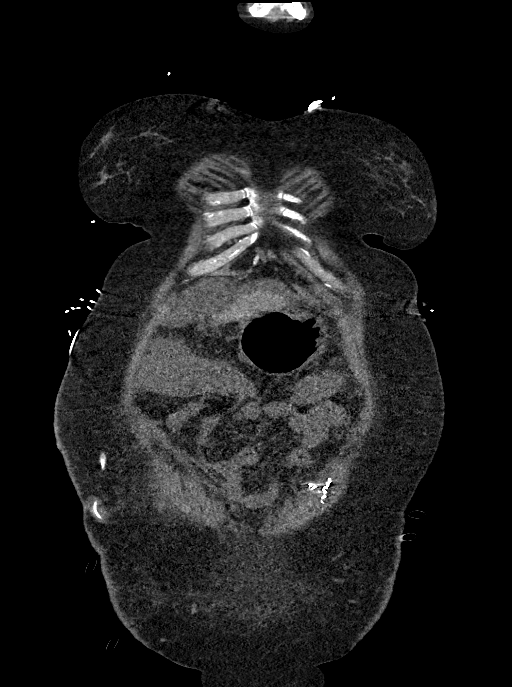
[im 67/168  mediastinal]
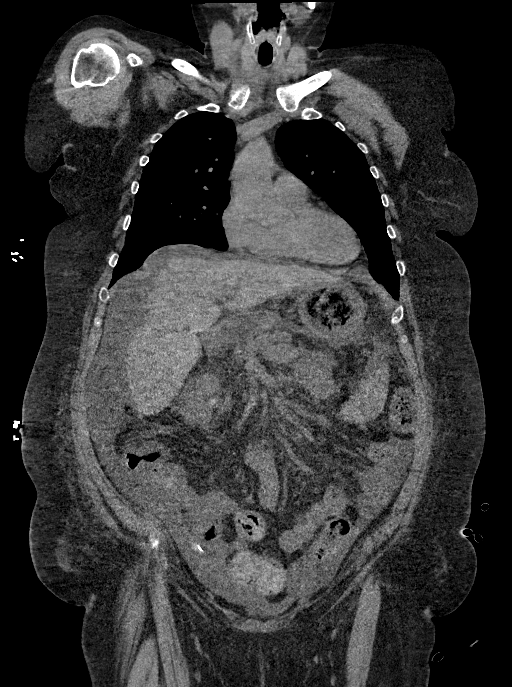
[im 101/168  mediastinal]
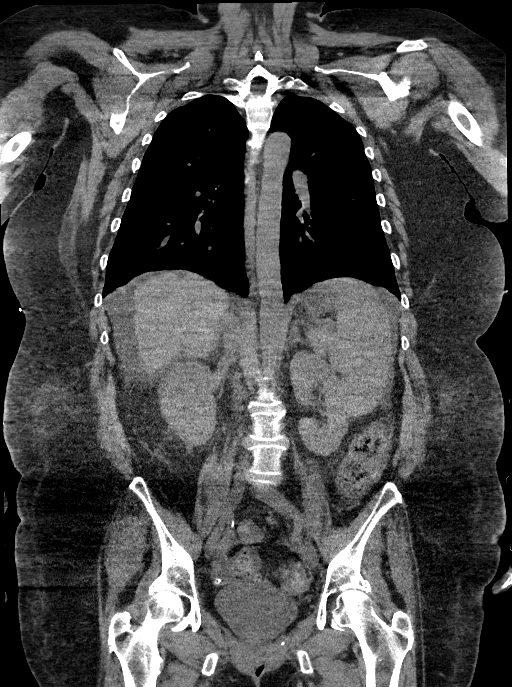

[14 of 36 positions shown; findings below may reference images not displayed]

FINDINGS: CT CHEST FINDINGS

Cardiovascular: Mild aortic atherosclerosis. No thoracic aortic
aneurysm. Normal size heart. No significant pericardial
effusion/thickening.

Mediastinum/Nodes: No discrete thyroid nodule. No pathologically
enlarged mediastinal or axillary lymph nodes. Trachea and esophagus
are grossly unremarkable.

Lungs/Pleura: Lungs are clear. No pleural effusion or pneumothorax.

Musculoskeletal: No acute osseous abnormality

CT ABDOMEN PELVIS FINDINGS

Hepatobiliary: Stable appearing cirrhotic hepatic changes evidence
of prior tips shunt. Stable cystic area in the hepatic dome and
along the falciform ligament. No worrisome hepatic lesions on
noncontrast exam. Gallbladder surgically absent. No biliary ductal
dilation

Pancreas: Within normal limits.

Spleen: Stable splenomegaly.

Adrenals/Urinary Tract: Adrenal glands are unremarkable. Kidneys are
normal, without renal calculi, focal lesion, or hydronephrosis.
Bladder is unremarkable.

Stomach/Bowel: Stomach is grossly unremarkable. No suspicious small
bowel dilation. Mild wall thickening involving the ascending and
transverse colon likely portal colopathy.

Vascular/Lymphatic: Aortic atherosclerosis without aneurysmal
dilation stable prominent upper abdominal lymph nodes typical
sequela of cirrhosis. No retroperitoneal mass or adenopathy.

Reproductive: Status post hysterectomy. No adnexal masses.

Other: Interval placement of a right anterior peritoneal catheter
with tip in the pelvis. Slightly decreased moderate volume ascites.
Similar postsurgical changes along the left anterior abdominal
wall.

Musculoskeletal: Multilevel degenerative changes spine. No acute
osseous abnormality.
IMPRESSION: 1. No noncontrast CT evidence of acute traumatic injury within the
chest abdomen or pelvis.
2. Prior tip shunt placement with stable cirrhotic hepatic
morphology with sequela of portal hypertension including, stable
splenomegaly, mild ascending portal colopathy, and decreased now
moderate volume of ascites status post peritoneal catheter
placement.
3. Aortic atherosclerosis.

Aortic Atherosclerosis ([id number].V).

## 2021-09-07 IMAGING — CT CT MAXILLOFACIAL W/O CM
3 of 4 series · 14 of 47 positions shown, 16 images · non-contrast
Comparison: CT 07/24/2018

CLINICAL DATA: Bruising to right cheek from fall at [employer]

EXAM:
CT HEAD WITHOUT CONTRAST
CT MAXILLOFACIAL WITHOUT CONTRAST
CT CERVICAL SPINE WITHOUT CONTRAST
TECHNIQUE: Multidetector CT imaging of the head, cervical spine, and
maxillofacial structures were performed using the standard protocol
without intravenous contrast. Multiplanar CT image reconstructions
of the cervical spine and maxillofacial structures were also
generated.

[Series 2: max soft · axial · 0.41mm/px · z∈[+236,+410]mm · 8 of 101 slices shown, 10 images]
[im 7/101  brain]
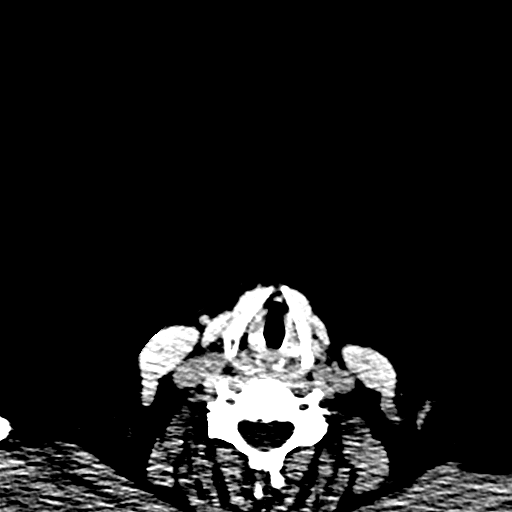
[im 7/101  bone]
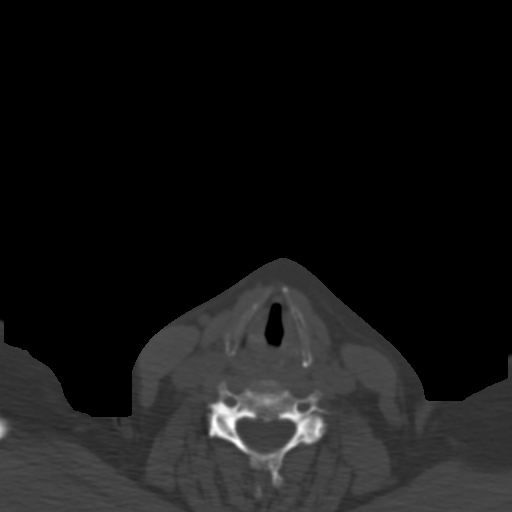
[im 21/101  bone]
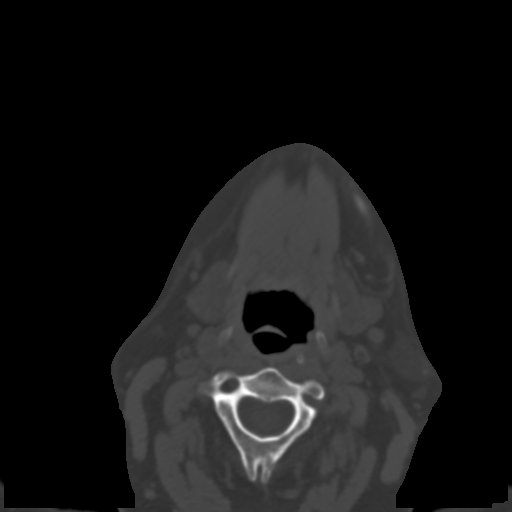
[im 32/101  bone]
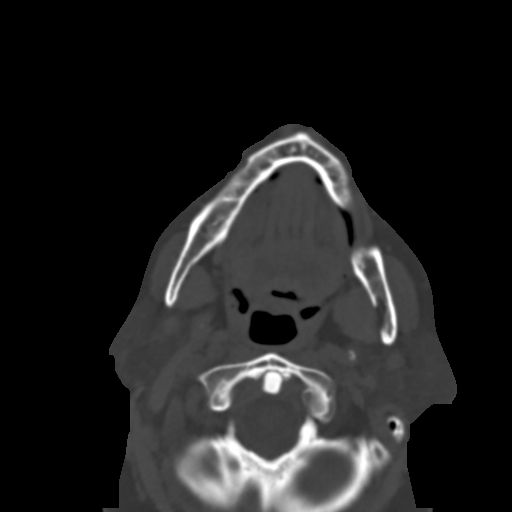
[im 45/101  bone]
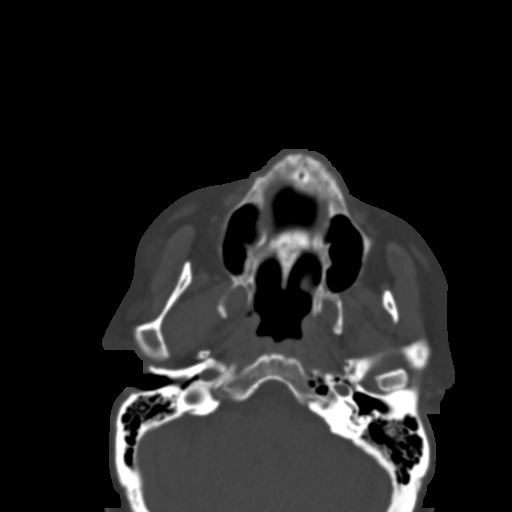
[im 56/101  brain]
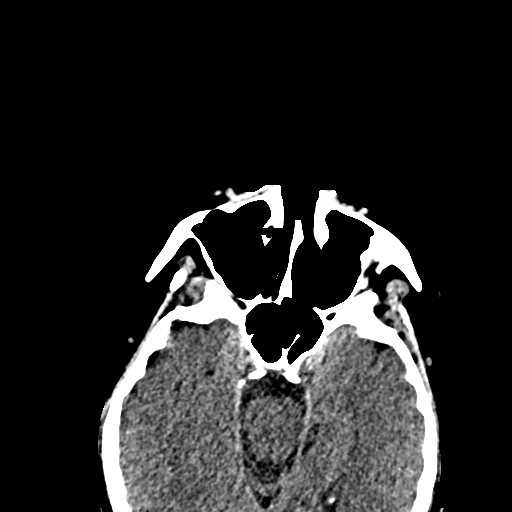
[im 56/101  bone]
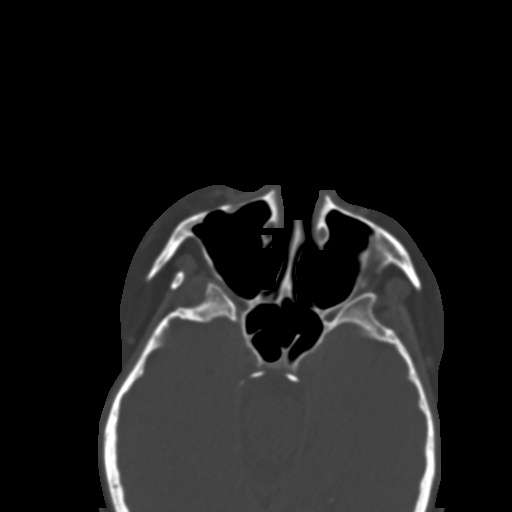
[im 69/101  bone]
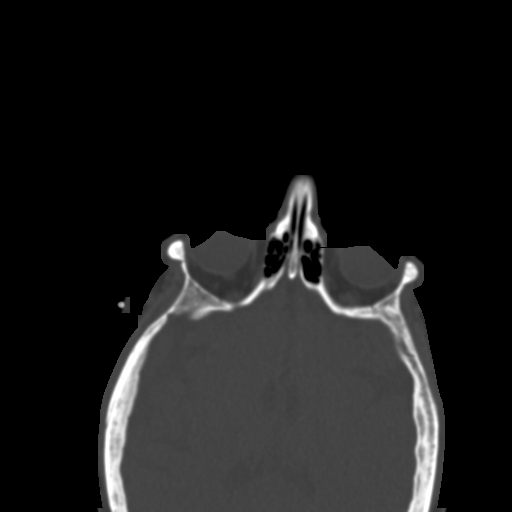
[im 80/101  bone]
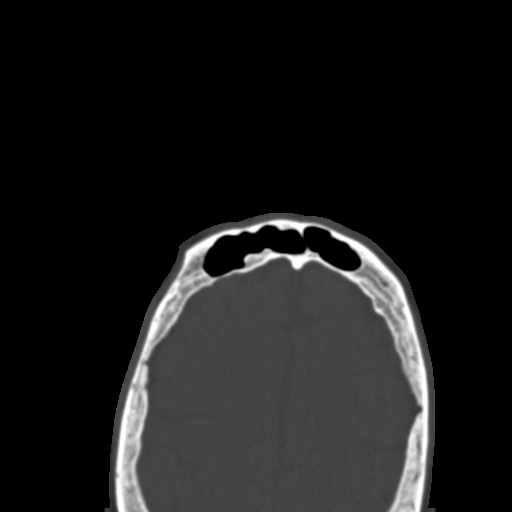
[im 94/101  bone]
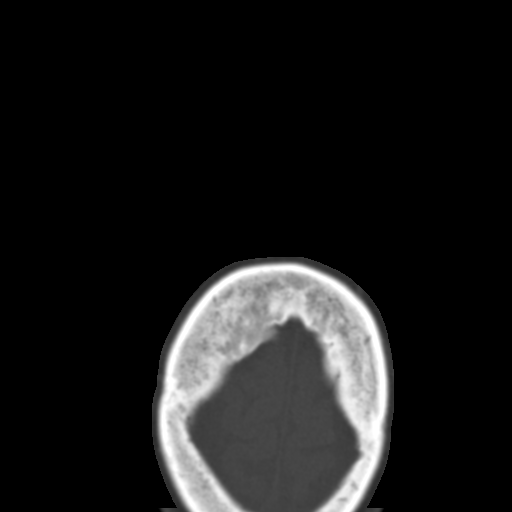

[Series 7: sagittal soft · sagittal · 0.43mm/px · 3 of 112 slices shown]
[im 38/112  bone]
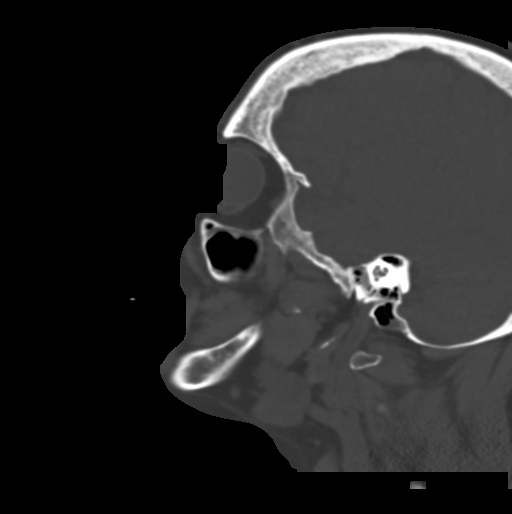
[im 56/112  bone]
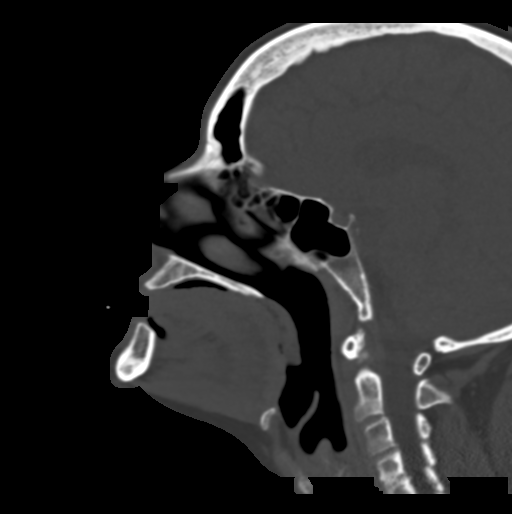
[im 75/112  bone]
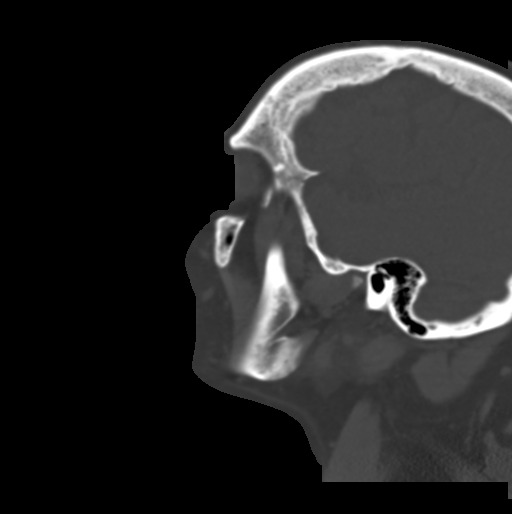

[Series 8: coronal bone · coronal · 0.39mm/px · 3 of 99 slices shown]
[im 25/99  bone]
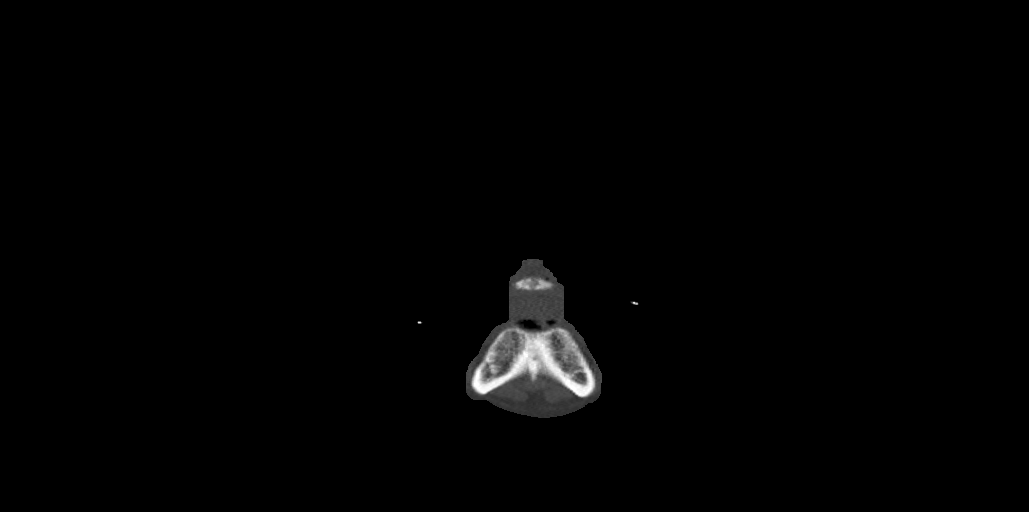
[im 50/99  bone]
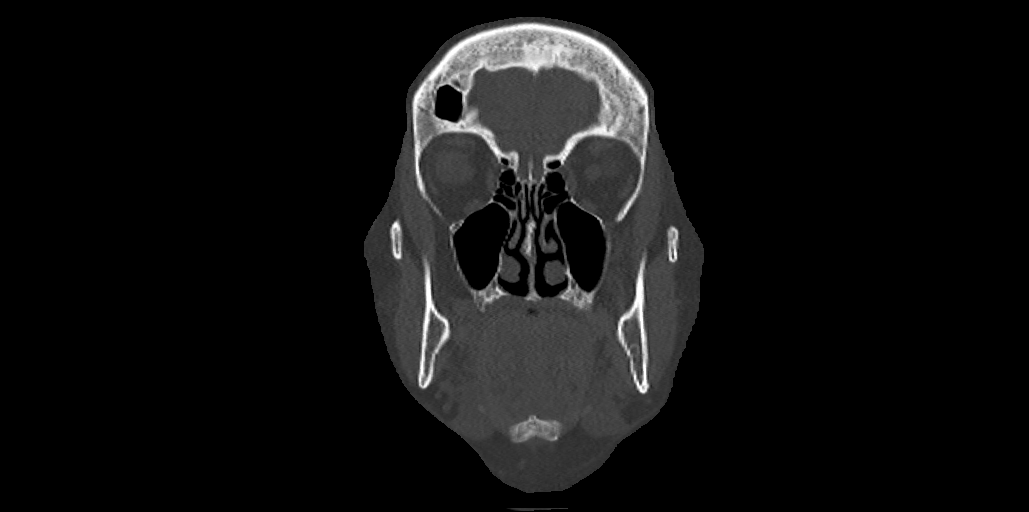
[im 74/99  bone]
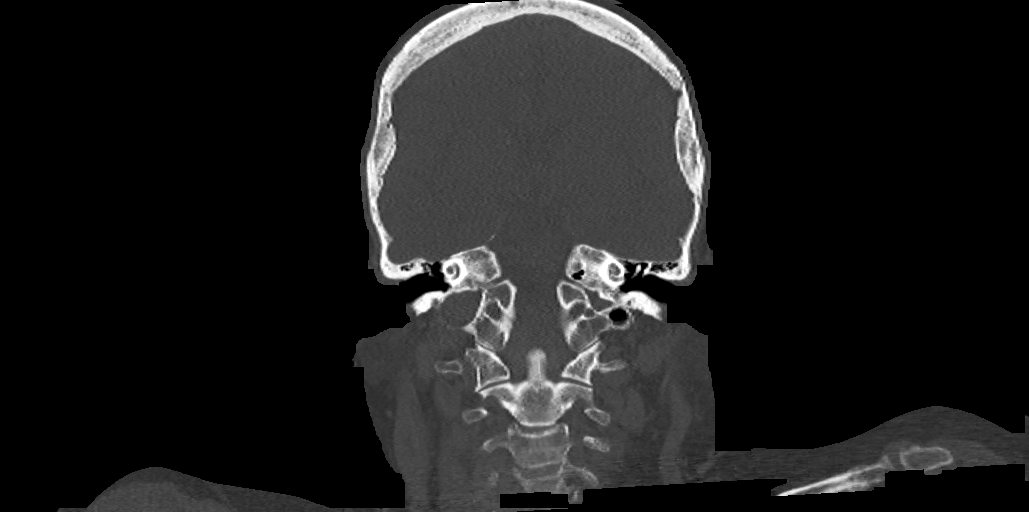

[14 of 47 positions shown; findings below may reference images not displayed]

FINDINGS: CT HEAD FINDINGS

Brain: No evidence of acute infarction, hemorrhage, hydrocephalus,
extra-axial collection or mass lesion/mass effect.

Vascular: No hyperdense vessels.  No unexpected calcification

Skull: Normal. Negative for fracture or focal lesion.

Other: None

CT MAXILLOFACIAL FINDINGS

Osseous: Mandibular heads are normally position. Mastoid air cells
are clear. No mandibular fracture. Pterygoid plates and zygomatic
arches are intact. No acute nasal fracture

Orbits: Negative. No traumatic or inflammatory finding.

Sinuses: Clear.

Soft tissues: Mild right premalar soft tissue swelling

CT CERVICAL SPINE FINDINGS

Alignment: Straightening of the cervical spine. No subluxation.
Facet alignment within normal limits.

Skull base and vertebrae: No acute fracture. No primary bone lesion
or focal pathologic process.

Soft tissues and spinal canal: No prevertebral fluid or swelling. No
visible canal hematoma.

Disc levels: Multiple level degenerative change with moderate disc
space narrowing and degenerative change at C4-C5, C5-C6 and C6-C7.
Posterior disc osteophyte complex at C4-C5 and C6-C7. Facet
degenerative changes at multiple levels. Moderate bilateral
foraminal narrowing C4-C5.

Upper chest: Negative.

Other: None
IMPRESSION: 1. Negative non contrasted CT appearance of the brain.
2. Mild right premalar soft tissue swelling. No acute facial bone
fracture.
3. Straightening of the cervical spine with degenerative changes. No
acute osseous abnormality.

## 2021-09-07 IMAGING — CT CT CERVICAL SPINE W/O CM
3 of 4 series · 12 of 33 positions shown, 14 images · non-contrast
Comparison: CT 07/24/2018

CLINICAL DATA: Bruising to right cheek from fall at [employer]

EXAM:
CT HEAD WITHOUT CONTRAST
CT MAXILLOFACIAL WITHOUT CONTRAST
CT CERVICAL SPINE WITHOUT CONTRAST
TECHNIQUE: Multidetector CT imaging of the head, cervical spine, and
maxillofacial structures were performed using the standard protocol
without intravenous contrast. Multiplanar CT image reconstructions
of the cervical spine and maxillofacial structures were also
generated.

[Series 4: sagittal bone · sagittal · 0.36mm/px · 5 of 97 slices shown, 6 images]
[im 33/97  bone]
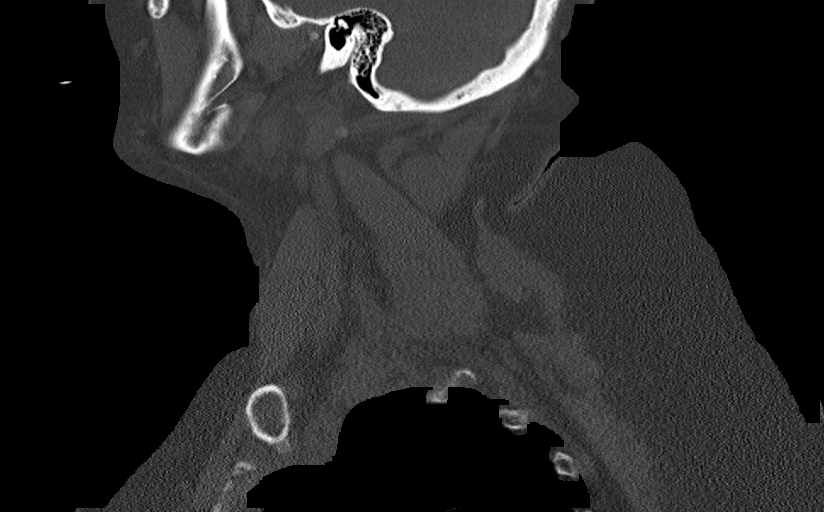
[im 41/97  bone]
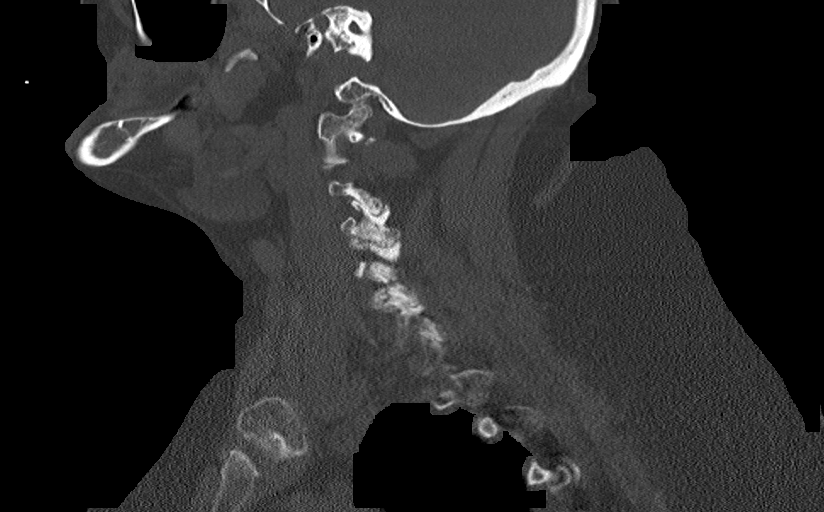
[im 49/97  soft-tissue]
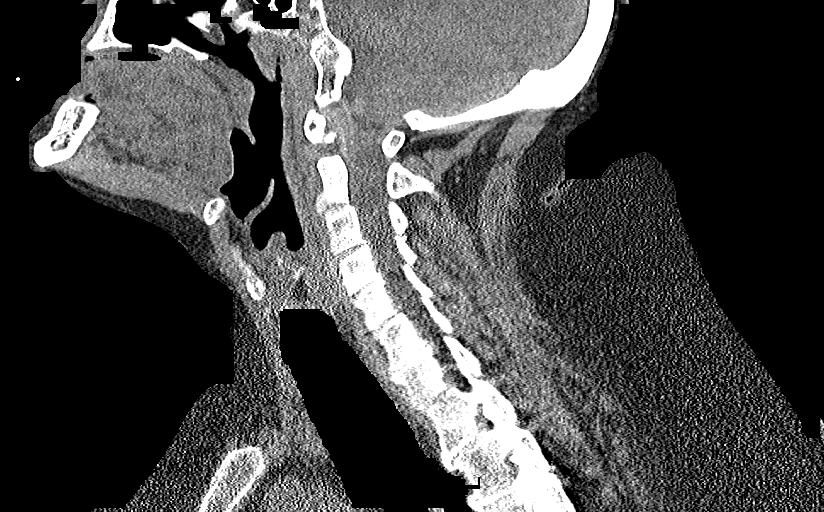
[im 49/97  bone]
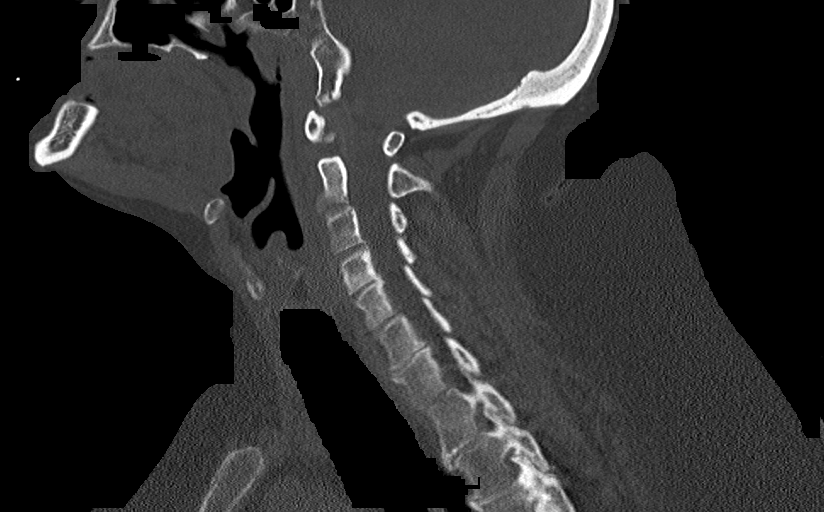
[im 57/97  bone]
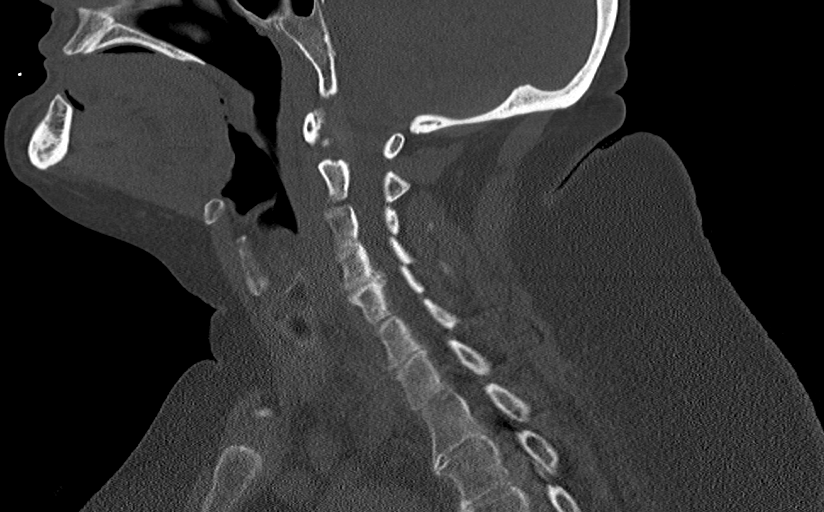
[im 65/97  bone]
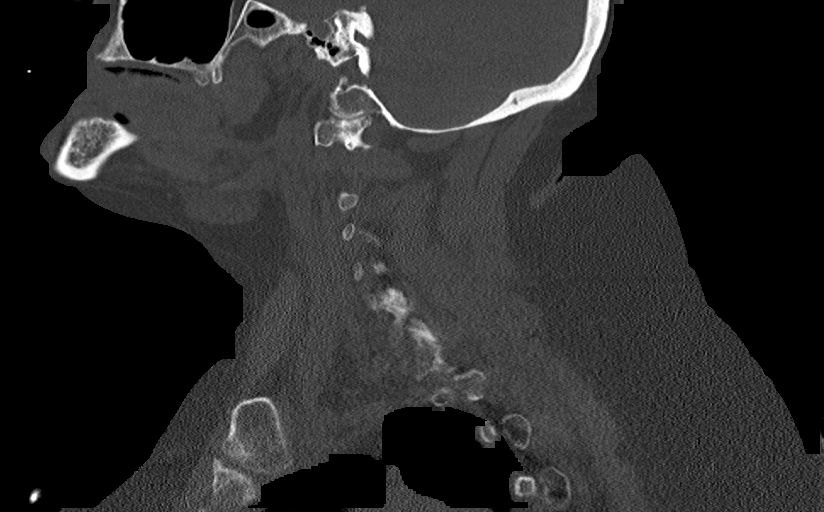

[Series 5: coronal bone · coronal · 0.41mm/px · 3 of 87 slices shown]
[im 24/87  bone]
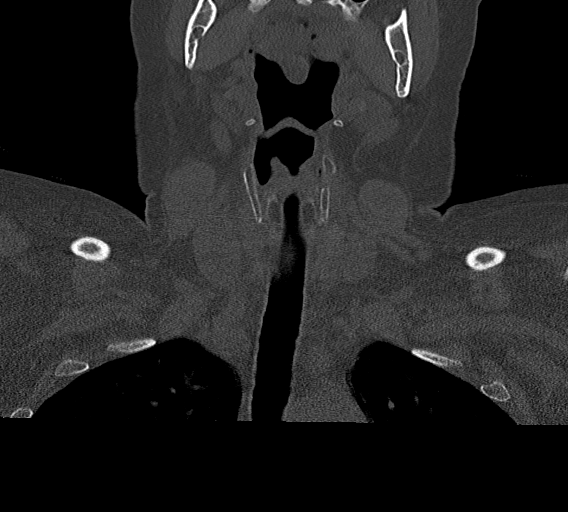
[im 37/87  bone]
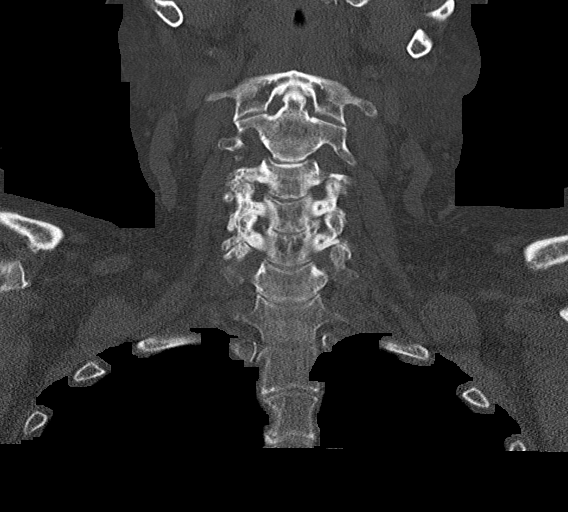
[im 50/87  bone]
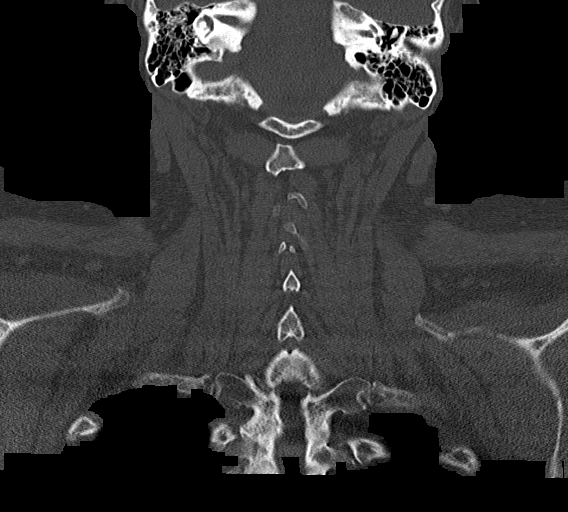

[Series 6: orthogonal axials · axial · 0.54mm/px · z∈[+87,+232]mm · 4 of 113 slices shown, 5 images]
[im 17/113  soft-tissue]
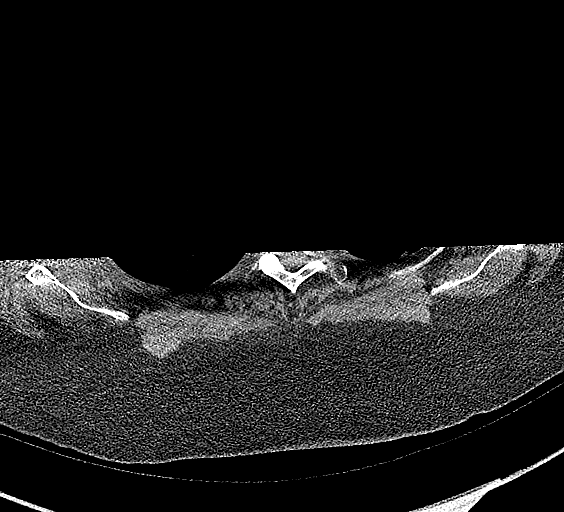
[im 17/113  bone]
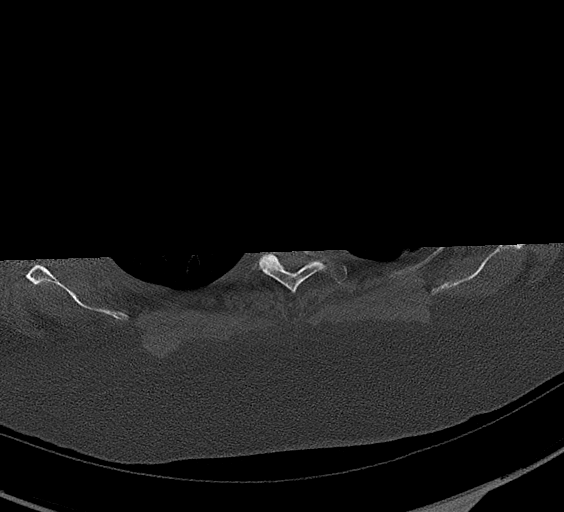
[im 49/113  bone]
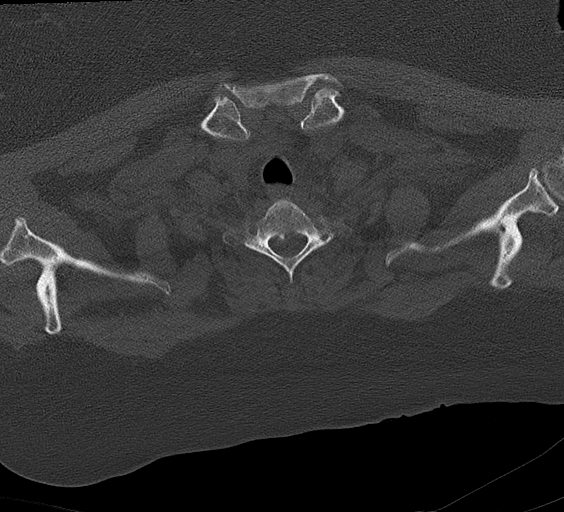
[im 65/113  bone]
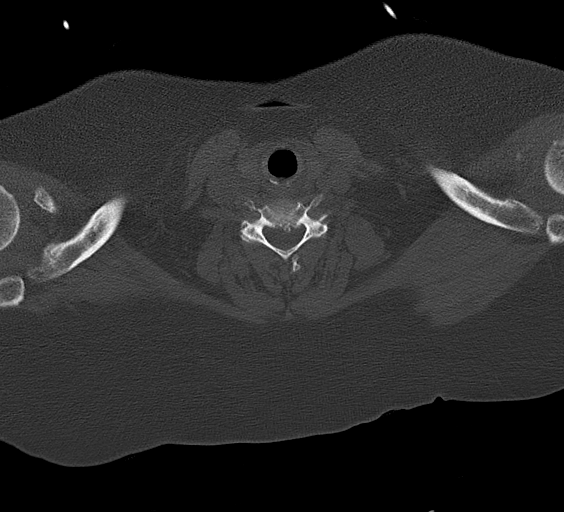
[im 97/113  bone]
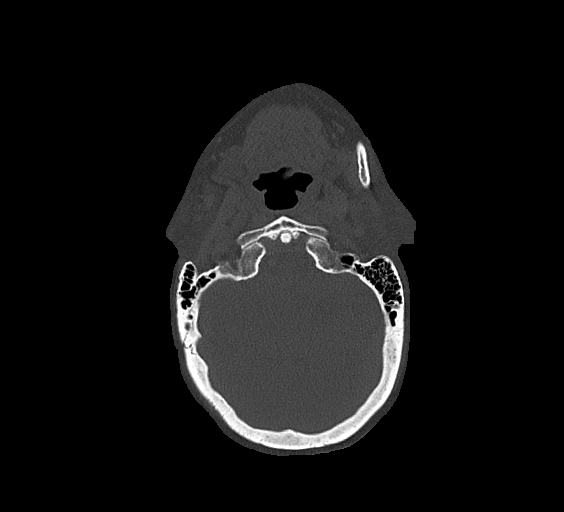

[12 of 33 positions shown; findings below may reference images not displayed]

FINDINGS: CT HEAD FINDINGS

Brain: No evidence of acute infarction, hemorrhage, hydrocephalus,
extra-axial collection or mass lesion/mass effect.

Vascular: No hyperdense vessels.  No unexpected calcification

Skull: Normal. Negative for fracture or focal lesion.

Other: None

CT MAXILLOFACIAL FINDINGS

Osseous: Mandibular heads are normally position. Mastoid air cells
are clear. No mandibular fracture. Pterygoid plates and zygomatic
arches are intact. No acute nasal fracture

Orbits: Negative. No traumatic or inflammatory finding.

Sinuses: Clear.

Soft tissues: Mild right premalar soft tissue swelling

CT CERVICAL SPINE FINDINGS

Alignment: Straightening of the cervical spine. No subluxation.
Facet alignment within normal limits.

Skull base and vertebrae: No acute fracture. No primary bone lesion
or focal pathologic process.

Soft tissues and spinal canal: No prevertebral fluid or swelling. No
visible canal hematoma.

Disc levels: Multiple level degenerative change with moderate disc
space narrowing and degenerative change at C4-C5, C5-C6 and C6-C7.
Posterior disc osteophyte complex at C4-C5 and C6-C7. Facet
degenerative changes at multiple levels. Moderate bilateral
foraminal narrowing C4-C5.

Upper chest: Negative.

Other: None
IMPRESSION: 1. Negative non contrasted CT appearance of the brain.
2. Mild right premalar soft tissue swelling. No acute facial bone
fracture.
3. Straightening of the cervical spine with degenerative changes. No
acute osseous abnormality.
